# Patient Record
Sex: Female | Born: 1965 | Race: Black or African American | Hispanic: No | Marital: Single | Smoking: Former smoker
Health system: Southern US, Community
[De-identification: ages and names within clinical notes are randomized; demographics above are authoritative.]

## PROBLEM LIST (undated history)

## (undated) DIAGNOSIS — Z720 Tobacco use: Secondary | ICD-10-CM

## (undated) DIAGNOSIS — J449 Chronic obstructive pulmonary disease, unspecified: Secondary | ICD-10-CM

## (undated) DIAGNOSIS — E1142 Type 2 diabetes mellitus with diabetic polyneuropathy: Secondary | ICD-10-CM

## (undated) DIAGNOSIS — F329 Major depressive disorder, single episode, unspecified: Secondary | ICD-10-CM

## (undated) DIAGNOSIS — A630 Anogenital (venereal) warts: Secondary | ICD-10-CM

## (undated) DIAGNOSIS — I10 Essential (primary) hypertension: Secondary | ICD-10-CM

## (undated) DIAGNOSIS — C3490 Malignant neoplasm of unspecified part of unspecified bronchus or lung: Secondary | ICD-10-CM

## (undated) DIAGNOSIS — E119 Type 2 diabetes mellitus without complications: Secondary | ICD-10-CM

## (undated) DIAGNOSIS — M79609 Pain in unspecified limb: Secondary | ICD-10-CM

## (undated) DIAGNOSIS — Z8719 Personal history of other diseases of the digestive system: Secondary | ICD-10-CM

## (undated) DIAGNOSIS — E785 Hyperlipidemia, unspecified: Secondary | ICD-10-CM

## (undated) DIAGNOSIS — J189 Pneumonia, unspecified organism: Secondary | ICD-10-CM

## (undated) DIAGNOSIS — F319 Bipolar disorder, unspecified: Secondary | ICD-10-CM

## (undated) DIAGNOSIS — I25119 Atherosclerotic heart disease of native coronary artery with unspecified angina pectoris: Secondary | ICD-10-CM

## (undated) DIAGNOSIS — F32A Depression, unspecified: Secondary | ICD-10-CM

## (undated) DIAGNOSIS — K029 Dental caries, unspecified: Secondary | ICD-10-CM

## (undated) DIAGNOSIS — F4310 Post-traumatic stress disorder, unspecified: Secondary | ICD-10-CM

## (undated) DIAGNOSIS — M797 Fibromyalgia: Secondary | ICD-10-CM

## (undated) DIAGNOSIS — N611 Abscess of the breast and nipple: Secondary | ICD-10-CM

## (undated) DIAGNOSIS — D573 Sickle-cell trait: Secondary | ICD-10-CM

## (undated) DIAGNOSIS — K219 Gastro-esophageal reflux disease without esophagitis: Secondary | ICD-10-CM

## (undated) DIAGNOSIS — D649 Anemia, unspecified: Secondary | ICD-10-CM

## (undated) HISTORY — PX: BREAST SURGERY: SHX581

## (undated) HISTORY — PX: CARDIAC CATHETERIZATION: SHX172

## (undated) HISTORY — PX: LARYNX SURGERY: SHX692

## (undated) HISTORY — DX: Pain in unspecified limb: M79.609

## (undated) HISTORY — DX: Gastro-esophageal reflux disease without esophagitis: K21.9

## (undated) HISTORY — PX: EYE SURGERY: SHX253

## (undated) HISTORY — PX: REFRACTIVE SURGERY: SHX103

## (undated) HISTORY — PX: BREAST EXCISIONAL BIOPSY: SUR124

## (undated) HISTORY — DX: Malignant neoplasm of unspecified part of unspecified bronchus or lung: C34.90

## (undated) HISTORY — DX: Abscess of the breast and nipple: N61.1

## (undated) HISTORY — DX: Atherosclerotic heart disease of native coronary artery with unspecified angina pectoris: I25.119

## (undated) HISTORY — DX: Chronic obstructive pulmonary disease, unspecified: J44.9

## (undated) HISTORY — PX: INCISE AND DRAIN ABCESS: PRO64

## (undated) HISTORY — PX: OTHER SURGICAL HISTORY: SHX169

---
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
## 1986-01-11 HISTORY — PX: TONSILLECTOMY: SUR1361

## 2001-01-11 HISTORY — PX: BLADDER SURGERY: SHX569

## 2005-10-11 ENCOUNTER — Encounter (INDEPENDENT_AMBULATORY_CARE_PROVIDER_SITE_OTHER): Payer: Self-pay | Admitting: Internal Medicine

## 2005-10-11 LAB — CONVERTED CEMR LAB: Pap Smear: NORMAL

## 2006-02-22 ENCOUNTER — Encounter (INDEPENDENT_AMBULATORY_CARE_PROVIDER_SITE_OTHER): Payer: Self-pay | Admitting: Internal Medicine

## 2006-02-22 ENCOUNTER — Ambulatory Visit: Payer: Self-pay | Admitting: Internal Medicine

## 2006-02-22 LAB — CONVERTED CEMR LAB

## 2006-03-15 ENCOUNTER — Ambulatory Visit: Payer: Self-pay | Admitting: *Deleted

## 2006-03-22 ENCOUNTER — Ambulatory Visit: Payer: Self-pay | Admitting: Internal Medicine

## 2006-03-24 ENCOUNTER — Ambulatory Visit: Payer: Self-pay | Admitting: Internal Medicine

## 2006-04-04 ENCOUNTER — Encounter: Admission: RE | Admit: 2006-04-04 | Discharge: 2006-07-03 | Payer: Self-pay | Admitting: Internal Medicine

## 2006-05-31 ENCOUNTER — Ambulatory Visit: Payer: Self-pay | Admitting: Internal Medicine

## 2006-08-01 ENCOUNTER — Ambulatory Visit: Payer: Self-pay | Admitting: Internal Medicine

## 2006-09-16 ENCOUNTER — Encounter: Payer: Self-pay | Admitting: Internal Medicine

## 2006-09-29 ENCOUNTER — Encounter (INDEPENDENT_AMBULATORY_CARE_PROVIDER_SITE_OTHER): Payer: Self-pay | Admitting: Internal Medicine

## 2006-09-30 ENCOUNTER — Encounter (INDEPENDENT_AMBULATORY_CARE_PROVIDER_SITE_OTHER): Payer: Self-pay | Admitting: Internal Medicine

## 2006-09-30 DIAGNOSIS — F1910 Other psychoactive substance abuse, uncomplicated: Secondary | ICD-10-CM | POA: Insufficient documentation

## 2006-09-30 DIAGNOSIS — M722 Plantar fascial fibromatosis: Secondary | ICD-10-CM | POA: Insufficient documentation

## 2006-09-30 DIAGNOSIS — A6000 Herpesviral infection of urogenital system, unspecified: Secondary | ICD-10-CM | POA: Insufficient documentation

## 2006-09-30 DIAGNOSIS — K219 Gastro-esophageal reflux disease without esophagitis: Secondary | ICD-10-CM

## 2006-10-26 ENCOUNTER — Ambulatory Visit (HOSPITAL_COMMUNITY): Admission: RE | Admit: 2006-10-26 | Discharge: 2006-10-26 | Payer: Self-pay | Admitting: Obstetrics

## 2006-11-09 ENCOUNTER — Ambulatory Visit: Payer: Self-pay | Admitting: Internal Medicine

## 2006-11-24 ENCOUNTER — Ambulatory Visit: Payer: Self-pay | Admitting: Internal Medicine

## 2006-11-24 DIAGNOSIS — F3132 Bipolar disorder, current episode depressed, moderate: Secondary | ICD-10-CM | POA: Insufficient documentation

## 2006-11-24 LAB — CONVERTED CEMR LAB
Beta hcg, urine, semiquantitative: NEGATIVE
Nitrite: NEGATIVE
Specific Gravity, Urine: 1.02
Urobilinogen, UA: NEGATIVE
WBC Urine, dipstick: NEGATIVE
pH: 5

## 2006-11-27 ENCOUNTER — Encounter (INDEPENDENT_AMBULATORY_CARE_PROVIDER_SITE_OTHER): Payer: Self-pay | Admitting: Internal Medicine

## 2006-12-07 ENCOUNTER — Ambulatory Visit: Payer: Self-pay | Admitting: Internal Medicine

## 2006-12-25 ENCOUNTER — Encounter (INDEPENDENT_AMBULATORY_CARE_PROVIDER_SITE_OTHER): Payer: Self-pay | Admitting: Internal Medicine

## 2006-12-25 LAB — CONVERTED CEMR LAB
Cholesterol: 165 mg/dL (ref 0–200)
LDL Cholesterol: 90 mg/dL (ref 0–99)
Total CHOL/HDL Ratio: 2.9
VLDL: 18 mg/dL (ref 0–40)

## 2007-01-25 ENCOUNTER — Ambulatory Visit: Payer: Self-pay | Admitting: Nurse Practitioner

## 2007-01-25 LAB — CONVERTED CEMR LAB
Blood Glucose, Fingerstick: 98
Hgb A1c MFr Bld: 6.7 %

## 2007-03-22 ENCOUNTER — Telehealth (INDEPENDENT_AMBULATORY_CARE_PROVIDER_SITE_OTHER): Payer: Self-pay | Admitting: Internal Medicine

## 2007-04-06 ENCOUNTER — Ambulatory Visit: Payer: Self-pay | Admitting: Internal Medicine

## 2007-04-06 DIAGNOSIS — B360 Pityriasis versicolor: Secondary | ICD-10-CM

## 2007-04-06 DIAGNOSIS — R1906 Epigastric swelling, mass or lump: Secondary | ICD-10-CM | POA: Insufficient documentation

## 2007-04-06 LAB — CONVERTED CEMR LAB: Blood Glucose, Fingerstick: 112

## 2007-04-11 ENCOUNTER — Encounter (INDEPENDENT_AMBULATORY_CARE_PROVIDER_SITE_OTHER): Payer: Self-pay | Admitting: Internal Medicine

## 2007-04-11 LAB — CONVERTED CEMR LAB
HDL: 60 mg/dL (ref >39)
LDL Cholesterol: 76 mg/dL (ref 0–99)

## 2007-05-10 ENCOUNTER — Telehealth (INDEPENDENT_AMBULATORY_CARE_PROVIDER_SITE_OTHER): Payer: Self-pay | Admitting: Internal Medicine

## 2007-05-11 ENCOUNTER — Ambulatory Visit: Payer: Self-pay | Admitting: Internal Medicine

## 2007-05-11 LAB — CONVERTED CEMR LAB: Beta hcg, urine, semiquantitative: NEGATIVE

## 2007-05-15 ENCOUNTER — Emergency Department (HOSPITAL_COMMUNITY): Admission: EM | Admit: 2007-05-15 | Discharge: 2007-05-15 | Payer: Self-pay | Admitting: Emergency Medicine

## 2007-05-18 ENCOUNTER — Encounter (INDEPENDENT_AMBULATORY_CARE_PROVIDER_SITE_OTHER): Payer: Self-pay | Admitting: Internal Medicine

## 2007-05-24 ENCOUNTER — Encounter (INDEPENDENT_AMBULATORY_CARE_PROVIDER_SITE_OTHER): Payer: Self-pay | Admitting: Internal Medicine

## 2007-06-08 ENCOUNTER — Ambulatory Visit: Payer: Self-pay | Admitting: Internal Medicine

## 2007-07-25 ENCOUNTER — Ambulatory Visit: Payer: Self-pay | Admitting: Internal Medicine

## 2007-07-26 ENCOUNTER — Encounter (INDEPENDENT_AMBULATORY_CARE_PROVIDER_SITE_OTHER): Payer: Self-pay | Admitting: Internal Medicine

## 2007-08-29 ENCOUNTER — Telehealth (INDEPENDENT_AMBULATORY_CARE_PROVIDER_SITE_OTHER): Payer: Self-pay | Admitting: Internal Medicine

## 2007-09-04 LAB — CONVERTED CEMR LAB
Basophils Absolute: 0 10*3/uL (ref 0.0–0.1)
Hemoglobin: 13.2 g/dL (ref 12.0–15.0)
Lymphocytes Relative: 35 % (ref 12–46)
Lymphs Abs: 3.2 10*3/uL (ref 0.7–4.0)
Monocytes Absolute: 0.4 10*3/uL (ref 0.1–1.0)
Neutro Abs: 5.6 10*3/uL (ref 1.7–7.7)
Platelets: 346 10*3/uL (ref 150–400)
RDW: 13.4 % (ref 11.5–15.5)
TSH: 4.021 microintl units/mL (ref 0.350–4.50)
WBC: 9.4 10*3/uL (ref 4.0–10.5)

## 2007-10-25 ENCOUNTER — Telehealth (INDEPENDENT_AMBULATORY_CARE_PROVIDER_SITE_OTHER): Payer: Self-pay | Admitting: Internal Medicine

## 2007-10-25 ENCOUNTER — Ambulatory Visit: Payer: Self-pay | Admitting: Internal Medicine

## 2007-10-27 ENCOUNTER — Ambulatory Visit (HOSPITAL_COMMUNITY): Admission: RE | Admit: 2007-10-27 | Discharge: 2007-10-27 | Payer: Self-pay | Admitting: Internal Medicine

## 2007-11-15 ENCOUNTER — Ambulatory Visit: Payer: Self-pay | Admitting: Internal Medicine

## 2007-11-15 LAB — CONVERTED CEMR LAB
Alkaline Phosphatase: 70 units/L (ref 39–117)
BUN: 11 mg/dL (ref 6–23)
Blood Glucose, Fingerstick: 127
Chlamydia, Swab/Urine, PCR: NEGATIVE
Glucose, Bld: 88 mg/dL (ref 70–99)
Hgb A1c MFr Bld: 7 %
Total Bilirubin: 0.5 mg/dL (ref 0.3–1.2)

## 2007-11-23 ENCOUNTER — Encounter (INDEPENDENT_AMBULATORY_CARE_PROVIDER_SITE_OTHER): Payer: Self-pay | Admitting: Internal Medicine

## 2007-12-05 ENCOUNTER — Encounter (INDEPENDENT_AMBULATORY_CARE_PROVIDER_SITE_OTHER): Payer: Self-pay | Admitting: Family Medicine

## 2007-12-15 ENCOUNTER — Ambulatory Visit: Payer: Self-pay | Admitting: Internal Medicine

## 2007-12-21 ENCOUNTER — Encounter (INDEPENDENT_AMBULATORY_CARE_PROVIDER_SITE_OTHER): Payer: Self-pay | Admitting: Internal Medicine

## 2007-12-24 ENCOUNTER — Emergency Department (HOSPITAL_COMMUNITY): Admission: EM | Admit: 2007-12-24 | Discharge: 2007-12-24 | Payer: Self-pay | Admitting: Emergency Medicine

## 2008-02-02 ENCOUNTER — Encounter (INDEPENDENT_AMBULATORY_CARE_PROVIDER_SITE_OTHER): Payer: Self-pay | Admitting: Internal Medicine

## 2008-02-02 ENCOUNTER — Telehealth (INDEPENDENT_AMBULATORY_CARE_PROVIDER_SITE_OTHER): Payer: Self-pay | Admitting: Internal Medicine

## 2008-02-06 ENCOUNTER — Ambulatory Visit: Payer: Self-pay | Admitting: Internal Medicine

## 2008-02-09 ENCOUNTER — Encounter (INDEPENDENT_AMBULATORY_CARE_PROVIDER_SITE_OTHER): Payer: Self-pay | Admitting: Internal Medicine

## 2008-03-05 ENCOUNTER — Ambulatory Visit: Payer: Self-pay | Admitting: Internal Medicine

## 2008-03-12 ENCOUNTER — Ambulatory Visit: Payer: Self-pay | Admitting: Internal Medicine

## 2008-03-12 LAB — CONVERTED CEMR LAB: Hgb A1c MFr Bld: 6.9 %

## 2008-03-14 ENCOUNTER — Encounter (INDEPENDENT_AMBULATORY_CARE_PROVIDER_SITE_OTHER): Payer: Self-pay | Admitting: Family Medicine

## 2008-04-11 ENCOUNTER — Encounter (INDEPENDENT_AMBULATORY_CARE_PROVIDER_SITE_OTHER): Payer: Self-pay | Admitting: Nurse Practitioner

## 2008-04-16 ENCOUNTER — Encounter (INDEPENDENT_AMBULATORY_CARE_PROVIDER_SITE_OTHER): Payer: Self-pay | Admitting: Internal Medicine

## 2008-04-17 ENCOUNTER — Encounter (INDEPENDENT_AMBULATORY_CARE_PROVIDER_SITE_OTHER): Payer: Self-pay | Admitting: Nurse Practitioner

## 2008-05-03 ENCOUNTER — Ambulatory Visit: Payer: Self-pay | Admitting: Internal Medicine

## 2008-05-03 DIAGNOSIS — L738 Other specified follicular disorders: Secondary | ICD-10-CM | POA: Insufficient documentation

## 2008-05-03 LAB — CONVERTED CEMR LAB: Blood Glucose, AC Bkfst: 94 mg/dL

## 2008-05-05 LAB — CONVERTED CEMR LAB
Albumin: 4.2 g/dL (ref 3.5–5.2)
Alkaline Phosphatase: 60 units/L (ref 39–117)
Basophils Absolute: 0 10*3/uL (ref 0.0–0.1)
CO2: 21 meq/L (ref 19–32)
Glucose, Bld: 87 mg/dL (ref 70–99)
Hemoglobin: 12.8 g/dL (ref 12.0–15.0)
LDL Cholesterol: 94 mg/dL (ref 0–99)
Lymphocytes Relative: 36 % (ref 12–46)
Lymphs Abs: 3.7 10*3/uL (ref 0.7–4.0)
Monocytes Absolute: 0.6 10*3/uL (ref 0.1–1.0)
Monocytes Relative: 6 % (ref 3–12)
Neutro Abs: 5.9 10*3/uL (ref 1.7–7.7)
Potassium: 4.6 meq/L (ref 3.5–5.3)
RBC: 4.23 M/uL (ref 3.87–5.11)
Sodium: 141 meq/L (ref 135–145)
Total Protein: 7.2 g/dL (ref 6.0–8.3)
Triglycerides: 173 mg/dL — ABNORMAL HIGH (ref <150)
WBC: 10.3 10*3/uL (ref 4.0–10.5)

## 2008-05-29 ENCOUNTER — Telehealth (INDEPENDENT_AMBULATORY_CARE_PROVIDER_SITE_OTHER): Payer: Self-pay | Admitting: Internal Medicine

## 2008-05-30 ENCOUNTER — Encounter (INDEPENDENT_AMBULATORY_CARE_PROVIDER_SITE_OTHER): Payer: Self-pay | Admitting: Internal Medicine

## 2008-07-05 ENCOUNTER — Telehealth (INDEPENDENT_AMBULATORY_CARE_PROVIDER_SITE_OTHER): Payer: Self-pay | Admitting: Internal Medicine

## 2008-07-10 ENCOUNTER — Ambulatory Visit: Payer: Self-pay | Admitting: Internal Medicine

## 2008-07-11 ENCOUNTER — Ambulatory Visit (HOSPITAL_COMMUNITY): Admission: RE | Admit: 2008-07-11 | Discharge: 2008-07-11 | Payer: Self-pay | Admitting: Obstetrics

## 2008-07-11 ENCOUNTER — Encounter (INDEPENDENT_AMBULATORY_CARE_PROVIDER_SITE_OTHER): Payer: Self-pay | Admitting: Internal Medicine

## 2008-08-13 ENCOUNTER — Ambulatory Visit: Payer: Self-pay | Admitting: Internal Medicine

## 2008-08-13 DIAGNOSIS — N63 Unspecified lump in unspecified breast: Secondary | ICD-10-CM | POA: Insufficient documentation

## 2008-08-13 LAB — CONVERTED CEMR LAB: Blood Glucose, Fingerstick: 191

## 2008-08-16 ENCOUNTER — Encounter: Admission: RE | Admit: 2008-08-16 | Discharge: 2008-08-16 | Payer: Self-pay | Admitting: Internal Medicine

## 2008-09-03 ENCOUNTER — Encounter (INDEPENDENT_AMBULATORY_CARE_PROVIDER_SITE_OTHER): Payer: Self-pay | Admitting: Internal Medicine

## 2008-10-17 ENCOUNTER — Encounter (INDEPENDENT_AMBULATORY_CARE_PROVIDER_SITE_OTHER): Payer: Self-pay | Admitting: Internal Medicine

## 2008-11-14 ENCOUNTER — Encounter: Admission: RE | Admit: 2008-11-14 | Discharge: 2008-11-14 | Payer: Self-pay | Admitting: Obstetrics

## 2008-11-25 ENCOUNTER — Encounter: Admission: RE | Admit: 2008-11-25 | Discharge: 2008-11-25 | Payer: Self-pay | Admitting: Obstetrics

## 2008-12-31 ENCOUNTER — Ambulatory Visit: Payer: Self-pay | Admitting: Internal Medicine

## 2009-01-23 ENCOUNTER — Inpatient Hospital Stay (HOSPITAL_COMMUNITY): Admission: EM | Admit: 2009-01-23 | Discharge: 2009-01-26 | Payer: Self-pay | Admitting: Emergency Medicine

## 2009-01-26 HISTORY — PX: NM MYOVIEW LTD: HXRAD82

## 2009-02-04 ENCOUNTER — Observation Stay (HOSPITAL_COMMUNITY): Admission: EM | Admit: 2009-02-04 | Discharge: 2009-02-04 | Payer: Self-pay | Admitting: Emergency Medicine

## 2009-02-04 ENCOUNTER — Telehealth (INDEPENDENT_AMBULATORY_CARE_PROVIDER_SITE_OTHER): Payer: Self-pay | Admitting: Internal Medicine

## 2009-02-05 ENCOUNTER — Ambulatory Visit: Payer: Self-pay | Admitting: Vascular Surgery

## 2009-02-05 ENCOUNTER — Encounter (INDEPENDENT_AMBULATORY_CARE_PROVIDER_SITE_OTHER): Payer: Self-pay | Admitting: Emergency Medicine

## 2009-02-05 ENCOUNTER — Ambulatory Visit (HOSPITAL_COMMUNITY): Admission: RE | Admit: 2009-02-05 | Discharge: 2009-02-05 | Payer: Self-pay | Admitting: Emergency Medicine

## 2009-05-28 ENCOUNTER — Emergency Department (HOSPITAL_COMMUNITY): Admission: EM | Admit: 2009-05-28 | Discharge: 2009-05-28 | Payer: Self-pay | Admitting: Emergency Medicine

## 2009-07-19 ENCOUNTER — Encounter (INDEPENDENT_AMBULATORY_CARE_PROVIDER_SITE_OTHER): Payer: Self-pay | Admitting: Internal Medicine

## 2009-07-25 ENCOUNTER — Ambulatory Visit: Payer: Self-pay | Admitting: Internal Medicine

## 2009-07-25 LAB — CONVERTED CEMR LAB: Hgb A1c MFr Bld: 9.2 %

## 2009-08-07 ENCOUNTER — Ambulatory Visit: Payer: Self-pay | Admitting: Internal Medicine

## 2009-09-03 ENCOUNTER — Telehealth (INDEPENDENT_AMBULATORY_CARE_PROVIDER_SITE_OTHER): Payer: Self-pay | Admitting: Internal Medicine

## 2009-09-05 ENCOUNTER — Ambulatory Visit: Payer: Self-pay | Admitting: Internal Medicine

## 2009-09-05 LAB — CONVERTED CEMR LAB: Blood Glucose, Fingerstick: 142

## 2009-09-16 ENCOUNTER — Ambulatory Visit: Payer: Self-pay | Admitting: Internal Medicine

## 2009-09-24 ENCOUNTER — Encounter
Admission: RE | Admit: 2009-09-24 | Discharge: 2009-11-05 | Payer: Self-pay | Source: Home / Self Care | Admitting: Internal Medicine

## 2009-09-25 ENCOUNTER — Ambulatory Visit: Payer: Self-pay | Admitting: Internal Medicine

## 2009-10-30 ENCOUNTER — Ambulatory Visit: Payer: Self-pay | Admitting: Internal Medicine

## 2009-11-03 ENCOUNTER — Ambulatory Visit: Payer: Self-pay | Admitting: Internal Medicine

## 2009-11-04 ENCOUNTER — Encounter (INDEPENDENT_AMBULATORY_CARE_PROVIDER_SITE_OTHER): Payer: Self-pay | Admitting: Internal Medicine

## 2009-11-13 ENCOUNTER — Ambulatory Visit: Payer: Self-pay | Admitting: Internal Medicine

## 2009-11-17 ENCOUNTER — Encounter (INDEPENDENT_AMBULATORY_CARE_PROVIDER_SITE_OTHER): Payer: Self-pay | Admitting: Internal Medicine

## 2009-11-27 ENCOUNTER — Encounter: Admission: RE | Admit: 2009-11-27 | Discharge: 2009-11-27 | Payer: Self-pay | Admitting: Internal Medicine

## 2009-11-28 ENCOUNTER — Ambulatory Visit: Payer: Self-pay | Admitting: Internal Medicine

## 2009-12-02 ENCOUNTER — Encounter: Admission: RE | Admit: 2009-12-02 | Discharge: 2009-12-02 | Payer: Self-pay | Admitting: Internal Medicine

## 2009-12-02 ENCOUNTER — Encounter (INDEPENDENT_AMBULATORY_CARE_PROVIDER_SITE_OTHER): Payer: Self-pay | Admitting: Internal Medicine

## 2009-12-18 ENCOUNTER — Ambulatory Visit: Payer: Self-pay | Admitting: Internal Medicine

## 2009-12-18 DIAGNOSIS — G5600 Carpal tunnel syndrome, unspecified upper limb: Secondary | ICD-10-CM | POA: Insufficient documentation

## 2009-12-18 LAB — CONVERTED CEMR LAB: Blood Glucose, Fingerstick: 297

## 2010-02-01 ENCOUNTER — Encounter: Payer: Self-pay | Admitting: Internal Medicine

## 2010-02-10 NOTE — Letter (Signed)
Summary: DR.BREWINGTON/OPHTHALMOLOGIST/SPECIALIST  DR.BREWINGTON/OPHTHALMOLOGIST/SPECIALIST   Imported By: Arta Bruce 12/09/2009 15:55:12  _____________________________________________________________________  External Attachment:    Type:   Image     Comment:   External Document

## 2010-02-10 NOTE — Miscellaneous (Signed)
Summary: review of hospital records  Clinical Lists Changes  Problems: Added new problem of CHEST PAIN, NON-CARDIAC (ICD-786.59) - Hospitalized 01/23/2009:  CT angiogram of chest negative for PE. Nuclear Myoview/perfusion test of heart negatie for evidence of ischemiea.  EF 67%.  Ultrasound of abdomen normal.

## 2010-02-10 NOTE — Letter (Signed)
Summary: PSYCHOLOGY NOTES  PSYCHOLOGY NOTES   Imported By: Arta Bruce 08/25/2009 10:40:32  _____________________________________________________________________  External Attachment:    Type:   Image     Comment:   External Document

## 2010-02-10 NOTE — Progress Notes (Signed)
Summary: Possible D/C  Phone Note Outgoing Call   Summary of Call: Tiffany, Please contact pt and review no show policy. Pt is on 3rd no show any additional will result in discharge from practice. Thanks Initial call taken by: Hassell Halim CMA,  September 03, 2009 10:13 AM  Follow-up for Phone Call        pt reminded at her appt today. Follow-up by: Vesta Mixer CMA,  September 05, 2009 3:33 PM
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
## 2010-02-10 NOTE — Miscellaneous (Signed)
Summary: Rehab Report//INITIAL SUMMARY  Rehab Report//INITIAL SUMMARY   Imported By: Arta Bruce 11/17/2009 10:39:05  _____________________________________________________________________  External Attachment:    Type:   Image     Comment:   External Document

## 2010-02-10 NOTE — Miscellaneous (Signed)
Summary: Rehab Report//DISCHARGE SUMMARY  Rehab Report//DISCHARGE SUMMARY   Imported By: Arta Bruce 11/17/2009 16:47:37  _____________________________________________________________________  External Attachment:    Type:   Image     Comment:   External Document

## 2010-02-10 NOTE — Progress Notes (Signed)
Summary: COMPLAINING OF SWELLING  Phone Note Call from Patient Call back at [phone]   Summary of Call: Cassidy [name] PT. PATIENT CALLED AND IS COMPLAINING OF SWELLING IN BOTH HER WRIST, HANDS AND LIPS AND SAYS HER BALANCE IS A LITTLE OFF. SHE THINKS ITS COMING FROM THE NEW CHOLESTEROL MEDICATION THAT SHE WAS PUT ON WHEN SHE LEFT THE HOSPITAL. Initial call taken by: Leodis Rains,  February 04, 2009 9:48 AM  Follow-up for Phone Call        Spoke with pt she said she was having swelling in her ankles and legs and some in her lips and arms last night.  Today is better.  She has been taking the cholesterol med since 01/26/09 and does not think it is from the med and wonders if she should be on a fluid pill.  She has an appt with Dr. Delrae Alfred on next Tuesday.  Pt can be reached at [phone].  She is at school today and can check her msgs in between class. Follow-up by: Vesta Mixer CMA,  February 04, 2009 10:50 AM  Additional Follow-up for Phone Call Additional follow up Details #1::        Called pt--son answered and stated she had gone to the ED. Not sure if she will be admitted. He will let her know we called. Additional Follow-up by: Julieanne Manson MD,  February 04, 2009 6:35 PM

## 2010-02-10 NOTE — Assessment & Plan Note (Signed)
Summary: FU VISIT//GK   Vital Signs:  Patient profile:   45 year old female Height:      67 inches Weight:      250 pounds BMI:     39.30 Temp:     98.2 degrees F oral Pulse rate:   88 / minute Pulse rhythm:   regular Resp:     18 per minute BP sitting:   134 / 88  (left arm) Cuff size:   large  Vitals Entered By: [place] [name] (July 25, 2009 3:55 PM) CC: pt here for f/u...Marland KitchenMarland Kitchen pt says she has been stop taking byetta because they put her on warfin while in hospital and the shots made her bruise so she stop taking the byetta shots... Is Patient Diabetic? Yes Pain Assessment Patient in pain? no      CBG Result 148  Does patient need assistance? Functional Status Self care Ambulation Normal   CC:  pt here for f/u...Marland KitchenMarland Kitchen pt says she has been stop taking byetta because they put her on warfin while in hospital and the shots made her bruise so she stop taking the byetta shots....  History of Present Illness: 1.  Chest pain:  pt admitted and underwent nuclear stress testing that was negative for ischemia in January--Good EF at 67%.  Subsequently had swelling of legs--seen in ED and ruled out for DVT.  Following with [facility]--Dr. Lynnea Ferrier.  No chest pain since admission  2.  DM:  Not checking sugars.  Got burned out on taking medication and just not taking.  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  Would be willing to see Namon Cirri getting any counseling now.  No longer on Coumadin  3. Stressed and tearful a lot:  Not suicidal, but some days, just does not want to be here.  Not taking Paxil and Depakote--[facility]--does [facility].  Has not taken for 3 months.  Has appt. next Thursday with [facility].  4.  Sore throat, lot of drainage down throat, coughing--seems to come and go..  Getting pink eye.  Was given unknown eye drops for pink eye in [place] last week--helped.  Had a fever with cold and vomiting diarrhea last  week.   Allergies (verified): No Known Drug Allergies  Physical Exam  General:  NAD Eyes:  Mild conjunctival injection bilaterally Nose:  clear nasal discharge and mucosal pallor.   Mouth:  Cobbled posterior pharynx Lungs:  Normal respiratory effort, chest expands symmetrically. Lungs are clear to auscultation, no crackles or wheezes. Heart:  Normal rate and regular rhythm. S1 and S2 normal without gallop, murmur, click, rub or other extra sounds.  Radial pulses normal and equal. Extremities:  no significant edema   Impression & Recommendations:  Problem # 1:  CHEST PAIN, NON-CARDIAC (ICD-786.59) As per Dr. Lynnea Ferrier.  Problem # 2:  BIPOLAR I D/O MOST RECENT EPIS DEPRESSED MOD (ICD-296.52) Encouraged pt. to get back on her meds. If Bipolar Disorder/depression not controlled, will have difficulty controlling other health concerns. Orders: Psychology Referral (Psychology)--Amanda Vaughn  Problem # 3:  DM (ICD-250.00) Encouraged to get back on meds. Her updated medication list for this problem includes:    Lisinopril 5 Mg Tabs (Lisinopril) .Marland Kitchen... 1/2 tab by mouth daily    Metformin Hcl 500 Mg Tabs (Metformin hcl) .Marland Kitchen... 1 tab by mouth two times a day    Byetta 5 Mcg Pen 5 Mcg/0.70ml Soln (Exenatide) .Marland KitchenMarland KitchenMarland KitchenMarland Kitchen 5 micrograms subcutaneously two times a day before meals  Problem #  4:  ALLERGIC RHINITIS WITH CONJUNCTIVITIS (ICD-477.9) Start meds. Her updated medication list for this problem includes:    Fluticasone Propionate 50 Mcg/act Susp (Fluticasone propionate) .Marland Kitchen... 2 sprays each nostril daily    Cetirizine Hcl 10 Mg Tabs (Cetirizine hcl) .Marland Kitchen... 1 tab by mouth daily  Complete Medication List: 1)  Lisinopril 5 Mg Tabs (Lisinopril) .... 1/2 tab by mouth daily 2)  Gabapentin 300 Mg Caps (Gabapentin) .Marland Kitchen.. 1 cap by mouth two times a day 3)  Paxil Cr 25 Mg Tb24 (Paroxetine hcl) .Marland Kitchen.. 1 tab by mouth daily 4)  Metformin Hcl 500 Mg Tabs (Metformin hcl) .Marland Kitchen.. 1 tab by mouth two times a day 5)   Enablex 15 Mg Tb24 (Darifenacin hydrobromide) .Marland Kitchen.. 1 tab by mouth daily 6)  Elmiron 100 Mg Caps (Pentosan polysulfate sodium) .... 2 caps in am and 1 cap at hs. 7)  Naproxen Sodium 550 Mg Tabs (Naproxen sodium) .Marland Kitchen.. 1 tab by mouth two times a day as needed headache 8)  Proventil 90 Mcg/act Aers (Albuterol) .... 2 puffs every 6 hours as needed for shortness of breath 9)  Omeprazole 20 Mg Tbec (Omeprazole) .... 2 tabs by mouth daily 10)  Depakote Er 500 Mg Xr24h-tab (Divalproex sodium) .Marland Kitchen.. 1 tab by mouth at bedtime--[facility] 11)  Acyclovir 200 Mg Caps (Acyclovir) .... 2 by mouth once daily 12)  Prenatal Plus 27-1 Mg Tabs (Prenatal vit-fe fumarate-fa) .... Take one tab by mouth every day--gynecology 13)  Byetta 5 Mcg Pen 5 Mcg/0.68ml Soln (Exenatide) .... 5 micrograms subcutaneously two times a day before meals 14)  Pen Needles 31g X 6 Mm Misc (Insulin pen needle) .... To use with byetta pen  two times a day 15)  Fluticasone Propionate 50 Mcg/act Susp (Fluticasone propionate) .... 2 sprays each nostril daily 16)  Cetirizine Hcl 10 Mg Tabs (Cetirizine hcl) .Marland Kitchen.. 1 tab by mouth daily  Patient Instructions: 1)  Follow up with Dr. Delrae Alfred in 6 weeks  Prescriptions: NAPROXEN SODIUM 550 MG  TABS (NAPROXEN SODIUM) 1 tab by mouth two times a day as needed headache  #30 x 0   Entered and Authorized by:   Julieanne Manson MD   Signed by:   Julieanne Manson MD on 07/25/2009   Method used:   Electronically to        [facility] [phone]* (retail)       [address]       [place], [place]  [address]       Ph: [phone]       Fax: [phone]   RxID:   [id number] CETIRIZINE HCL 10 MG TABS (CETIRIZINE HCL) 1 tab by mouth daily  #30 x 11   Entered and Authorized by:   Julieanne Manson MD   Signed by:   Julieanne Manson MD on 07/25/2009   Method used:   Electronically to        [facility] [phone]* (retail)       [address]       [place], [place]  [address]        Ph: [phone]       Fax: [phone]   RxID:   [id number] FLUTICASONE PROPIONATE 50 MCG/ACT SUSP (FLUTICASONE PROPIONATE) 2 sprays each nostril daily  #1 x 11   Entered and Authorized by:   Julieanne Manson MD   Signed by:   Julieanne Manson MD on 07/25/2009   Method used:   Electronically to        [facility] [address] [phone]* (retail)       [address]       [place], [place]  [address]       Ph: [phone]       Fax: [phone]   RxID:   [id number] GABAPENTIN 300 MG CAPS (GABAPENTIN) 1 cap by mouth two times a day  #60 x 11   Entered and Authorized by:   Julieanne Manson MD   Signed by:   Julieanne Manson MD on 07/25/2009   Method used:   Print then Give to Patient   RxID:   [id number] PEN NEEDLES 31G X 6 MM MISC (INSULIN PEN NEEDLE) To use with Byetta pen  two times a day  #100 x 11   Entered and Authorized by:   Julieanne Manson MD   Signed by:   Julieanne Manson MD on 07/25/2009   Method used:   Electronically to        [facility] [phone]* (retail)       [address]       [place], [place]  [address]       Ph: [phone]       Fax: [phone]   RxID:   [id number] BYETTA 5 MCG PEN 5 MCG/0.02ML SOLN (EXENATIDE) 5 micrograms subcutaneously two times a day before meals  #1 Milliliter x 11   Entered and Authorized by:   Julieanne Manson MD   Signed by:   Julieanne Manson MD on 07/25/2009   Method used:   Electronically to        [facility] [phone]* (retail)       [address]       [place], [place]  [address]       Ph: [phone]       Fax: [phone]   RxID:   [id number] ACYCLOVIR 200 MG CAPS (ACYCLOVIR) 2 by mouth once daily  #60 x 11   Entered and Authorized by:   Julieanne Manson MD   Signed by:   Julieanne Manson MD on 07/25/2009   Method used:   Electronically to        [facility] [phone]* (retail)       [address]       [place], [place]  [address]       Ph: [phone]       Fax:  [phone]   RxID:   [id number] OMEPRAZOLE 20 MG  TBEC (OMEPRAZOLE) 2 tabs by mouth daily  #60 x 11   Entered and Authorized by:   Julieanne Manson MD   Signed by:   Julieanne Manson MD on 07/25/2009   Method used:   Electronically to        [facility] [phone]* (retail)       [address]       [place], [place]  [address]       Ph: [phone]       Fax: [phone]   RxID:   [id number] METFORMIN HCL 500 MG  TABS (METFORMIN HCL) 1 tab by mouth two times a day  #60 Each x 10   Entered and Authorized by:   Julieanne Manson MD   Signed by:   Julieanne Manson MD on 07/25/2009   Method used:   Electronically to        [facility] [phone]* (retail)       [address]       [place], [place]  [address]  Ph: [phone]       Fax: [phone]   RxID:   [id number] LISINOPRIL 5 MG  TABS (LISINOPRIL) 1/2 tab by mouth daily  #15 Each x 11   Entered and Authorized by:   Julieanne Manson MD   Signed by:   Julieanne Manson MD on 07/25/2009   Method used:   Electronically to        [facility] [phone]* (retail)       [address]       [place], [place]  [address]       Ph: [phone]       Fax: [phone]   RxID:   [id number]   Laboratory Results   Blood Tests     HGBA1C: 9.2%   (Normal Range: Non-Diabetic - 3-6%   Control Diabetic - 6-8%) CBG Random:: 148mg /dL

## 2010-02-12 NOTE — Assessment & Plan Note (Signed)
Summary: neck pain. 3 month fup/JM   Vital Signs:  Patient profile:   45 year old female Menstrual status:  irregular LMP:     12/06/2009 Weight:      254.56 pounds Temp:     97.1 degrees F oral Pulse rate:   88 / minute Pulse rhythm:   regular Resp:     24 per minute BP sitting:   144 / 90  (left arm) Cuff size:   regular  Vitals Entered By: Hale Drone CMA (December 18, 2009 10:41 AM) CC: 3 month f/u on neck pain and DM. Wants to speak to you about anxiety meds.  Is Patient Diabetic? Yes Pain Assessment Patient in pain? no      CBG Result 297 CBG Device ID Non Fasting  Does patient need assistance? Functional Status Self care Ambulation Normal LMP (date): 12/06/2009 LMP - Character: heavy     Menstrual Status irregular Enter LMP: 12/06/2009 Last PAP Result Normal   CC:  3 month f/u on neck pain and DM. Wants to speak to you about anxiety meds. .  History of Present Illness: 1.  Neck pain and headaches:  PT helped --pain stopped and headaches as well.  2.  Lots of stressors:  not able to focus on school and so not doing well.  Son just turned 13 and getting into lots of trouble--going through juvenile court.  Son going into foster care for at least a month and mom planning to move to a better area to so that he is surrounded by kids who are better influences.  Son now cannot be around children younger than him.  3.  Numbness in index and middle finger.  Awakened with this this morning.  Current Medications (verified): 1)  Lisinopril 5 Mg  Tabs (Lisinopril) .... 1/2 Tab By Mouth Daily 2)  Gabapentin 300 Mg Caps (Gabapentin) .Marland Kitchen.. 1 Cap By Mouth Two Times A Day 3)  Paxil Cr 25 Mg  Tb24 (Paroxetine Hcl) .Marland Kitchen.. 1 Tab By Mouth Daily 4)  Metformin Hcl 500 Mg  Tabs (Metformin Hcl) .Marland Kitchen.. 1 Tab By Mouth Two Times A Day 5)  Enablex 15 Mg  Tb24 (Darifenacin Hydrobromide) .Marland Kitchen.. 1 Tab By Mouth Daily 6)  Elmiron 100 Mg  Caps (Pentosan Polysulfate Sodium) .... 2 Caps in Am and 1 Cap  At [facility]. 7)  Naproxen Sodium 550 Mg  Tabs (Naproxen Sodium) .Marland Kitchen.. 1 Tab By Mouth Two Times A Day As Needed Headache 8)  Proventil 90 Mcg/act  Aers (Albuterol) .... 2 Puffs Every 6 Hours As Needed For Shortness of Breath 9)  Omeprazole 20 Mg  Tbec (Omeprazole) .... 2 Tabs By Mouth Daily 10)  Depakote Er 500 Mg Xr24h-Tab (Divalproex Sodium) .Marland Kitchen.. 1 Tab By Mouth At [facility] 11)  Acyclovir 200 Mg Caps (Acyclovir) .... 2 By Mouth Once Daily 12)  Prenatal Plus 27-1 Mg Tabs (Prenatal Vit-Fe Fumarate-Fa) .... Take One Tab By Mouth Every Day--Gynecology 13)  Byetta 5 Mcg Pen 5 Mcg/0.75ml Soln (Exenatide) .... 5 Micrograms Subcutaneously Two Times A Day Before Meals 14)  Pen Needles 31g X 6 Mm Misc (Insulin Pen Needle) .... To Use With Byetta Pen  Two Times A Day 15)  Fluticasone Propionate 50 Mcg/act Susp (Fluticasone Propionate) .... 2 Sprays Each Nostril Daily 16)  Cetirizine Hcl 10 Mg Tabs (Cetirizine Hcl) .Marland Kitchen.. 1 Tab By Mouth Daily 17)  Wellbutrin Xl 150 Mg Xr24h-Tab (Bupropion Hcl) .Marland Kitchen.. 1 Tab By Mouth Daily--Pt. Not Sure of Dose--Through Dr. Mila Homer  Allergies (verified): No Known Drug Allergies  Physical Exam  General:  Stressed appearing Neurologic:  Positive Tinel's over left median nerve   Impression & Recommendations:  Problem # 1:  DEPRESSION (ICD-311) Encouraged pt. to make an appt. this week with [facility] Will discuss with Aquilla Solian, her counselor, who she is seeing today Her updated medication list for this problem includes:    Paxil Cr 25 Mg Tb24 (Paroxetine hcl) .Marland Kitchen... 1 tab by mouth daily--[facility]    Wellbutrin Xl 150 Mg Xr24h-tab (Bupropion hcl) .Marland Kitchen... 1 tab by mouth daily--pt. not sure of dose--through dr. Mila Homer  Problem # 2:  NECK PAIN (ICD-723.1) Resolved er updated medication list for this problem includes:    Naproxen Sodium 550 Mg Tabs (Naproxen sodium) .Marland Kitchen... 1 tab by mouth two times a day as needed headache  Problem # 3:  CARPAL TUNNEL  SYNDROME, LEFT (ICD-354.0) Cock up splint--to wear nightly  Complete Medication List: 1)  Lisinopril 5 Mg Tabs (Lisinopril) .... 1/2 tab by mouth daily 2)  Gabapentin 300 Mg Caps (Gabapentin) .Marland Kitchen.. 1 cap by mouth two times a day 3)  Paxil Cr 25 Mg Tb24 (Paroxetine hcl) .Marland Kitchen.. 1 tab by mouth daily--[facility] 4)  Metformin Hcl 500 Mg Tabs (Metformin hcl) .Marland Kitchen.. 1 tab by mouth two times a day 5)  Enablex 15 Mg Tb24 (Darifenacin hydrobromide) .Marland Kitchen.. 1 tab by mouth daily 6)  Elmiron 100 Mg Caps (Pentosan polysulfate sodium) .... 2 caps in am and 1 cap at hs. 7)  Naproxen Sodium 550 Mg Tabs (Naproxen sodium) .Marland Kitchen.. 1 tab by mouth two times a day as needed headache 8)  Proventil 90 Mcg/act Aers (Albuterol) .... 2 puffs every 6 hours as needed for shortness of breath 9)  Omeprazole 20 Mg Tbec (Omeprazole) .... 2 tabs by mouth daily 10)  Depakote Er 500 Mg Xr24h-tab (Divalproex sodium) .Marland Kitchen.. 1 tab by mouth at bedtime--[facility] 11)  Acyclovir 200 Mg Caps (Acyclovir) .... 2 by mouth once daily 12)  Prenatal Plus 27-1 Mg Tabs (Prenatal vit-fe fumarate-fa) .... Take one tab by mouth every day--gynecology 13)  Byetta 5 Mcg Pen 5 Mcg/0.73ml Soln (Exenatide) .... 5 micrograms subcutaneously two times a day before meals 14)  Pen Needles 31g X 6 Mm Misc (Insulin pen needle) .... To use with byetta pen  two times a day 15)  Fluticasone Propionate 50 Mcg/act Susp (Fluticasone propionate) .... 2 sprays each nostril daily 16)  Cetirizine Hcl 10 Mg Tabs (Cetirizine hcl) .Marland Kitchen.. 1 tab by mouth daily 17)  Wellbutrin Xl 150 Mg Xr24h-tab (Bupropion hcl) .Marland Kitchen.. 1 tab by mouth daily--pt. not sure of dose--through dr. Mila Homer  Other Orders: Capillary Blood Glucose/CBG [phone])  Patient Instructions: 1)  Wear cock up splint to bed each night 2)  Follow up with Dr. Delrae Alfred in 4 months --htn, DM   Orders Added: 1)  Capillary Blood Glucose/CBG [82948] 2)  Est. Patient Level III [81191]      Appended Document: neck  pain. 3 month fup/JM Spoke with Aquilla Solian later in morning--pt's son actually going to a specialized group home geared to reunite him with his mother as soon as possible.

## 2010-02-13 ENCOUNTER — Telehealth (INDEPENDENT_AMBULATORY_CARE_PROVIDER_SITE_OTHER): Payer: Self-pay | Admitting: *Deleted

## 2010-02-21 ENCOUNTER — Encounter (INDEPENDENT_AMBULATORY_CARE_PROVIDER_SITE_OTHER): Payer: Self-pay | Admitting: Internal Medicine

## 2010-02-26 NOTE — Letter (Signed)
Summary: Generic Letter  [facility]  [address] [place], [place] [address]   Phone: [phone]  Fax: [phone]    02/21/2010  [facility]  Re:  Cassidy [name]     [address] [place], [place]  [address]  To Whom It May Concen:  Cassidy [name] is my patient at [facility].  She has been dealing with significant physical and mental health issues, particularly dating back to July  through the late fall of 2011.  I ask that this is taken into account as she registers for school this year.     Sincerely,   Julieanne Manson MD

## 2010-03-04 NOTE — Progress Notes (Signed)
Summary: [facility] NEEDS A LETTER  Phone Note Call from Patient Call back at Home Phone [phone]   Summary of Call: [facility]ERR[facility] PT.  MS Kostelnik STOPPED BY BECAUSE SHE NEEDS A STATEMENT FOR [facility], STATING WHAT YOU WERE TREATING HER FOR, AS FAR AS HER DEPRESSION AND GOING THRU THINGS WITH HER SON. IT NEEDS TO STATE THAT SHE WAS BEING TREATED LAST SRPING AND FALL OF 2011 AND FALL OF 2012. THE REASON FOR THIS LETTER IS [facility] PUT HER ON PROBATION BECAUSE OF THE DEPRESSION AND GOING TO COURT, REGISTRATION IF MONDAY THE 6th AND THEY NEED THE LETTER BEFORE 5PM AND SHE ISA ALSO GETT A LETTER FROM [facility] ON MONDAY AS WELL. Initial call taken by: Leodis Rains,  February 13, 2010 3:02 PM  Follow-up for Phone Call        MS Nugent CALLED TO SEE IF SHE CAN GET THIS LETTER, BECAUSE SHE IS TRYING TO GETY REGISTERED TODAY AT [facility] AND ITS A ONE DAY REGISTRATION AND SHE'S SORRY THAT IT WAS SUCH SHORT NOTICE, BUT THE SCHOOL DIDN'T GIVE HER MUCH TIME EITHER. Follow-up by: Leodis Rains,  February 16, 2010 11:06 AM  Additional Follow-up for Phone Call Additional follow up Details #1::        I was out earlier in week and did not get to this.  Not sure if it would still be helpful for her, but she can pick up or have [place] fax to [facility].   Will put on Sheila's desk. Additional Follow-up by: Julieanne Manson MD,  February 21, 2010 1:58 PM    Additional Follow-up for Phone Call Additional follow up Details #2::    CALLED LEFT MESSAGE FOR PT TO PICK UP LETTER//IN DRAWER FOR PT PICK UP Follow-up by: Arta Bruce,  February 23, 2010 10:05 AM

## 2010-03-29 LAB — CBC
Hemoglobin: 12.9 g/dL (ref 12.0–15.0)
MCHC: 34.1 g/dL (ref 30.0–36.0)
Platelets: 298 10*3/uL (ref 150–400)
RDW: 13 % (ref 11.5–15.5)

## 2010-03-29 LAB — CARDIAC PANEL(CRET KIN+CKTOT+MB+TROPI)
CK, MB: 0.6 ng/mL (ref 0.3–4.0)
Relative Index: INVALID (ref 0.0–2.5)
Total CK: 67 U/L (ref 7–177)
Total CK: 69 U/L (ref 7–177)
Troponin I: 0.06 ng/mL (ref 0.00–0.06)

## 2010-03-29 LAB — DIFFERENTIAL
Basophils Absolute: 0.1 10*3/uL (ref 0.0–0.1)
Basophils Relative: 1 % (ref 0–1)
Monocytes Absolute: 0.6 10*3/uL (ref 0.1–1.0)
Neutro Abs: 6 10*3/uL (ref 1.7–7.7)
Neutrophils Relative %: 58 % (ref 43–77)

## 2010-03-29 LAB — HEPATIC FUNCTION PANEL
ALT: 12 U/L (ref 0–35)
AST: 12 U/L (ref 0–37)
Bilirubin, Direct: <0.1 mg/dL (ref 0.0–0.3)
Total Bilirubin: 0.6 mg/dL (ref 0.3–1.2)

## 2010-03-29 LAB — GLUCOSE, CAPILLARY
Glucose-Capillary: 109 mg/dL — ABNORMAL HIGH (ref 70–99)
Glucose-Capillary: 154 mg/dL — ABNORMAL HIGH (ref 70–99)
Glucose-Capillary: 166 mg/dL — ABNORMAL HIGH (ref 70–99)

## 2010-03-29 LAB — POCT CARDIAC MARKERS
CKMB, poc: <1 ng/mL — ABNORMAL LOW (ref 1.0–8.0)
Myoglobin, poc: 57.2 ng/mL (ref 12–200)

## 2010-03-29 LAB — TROPONIN I: Troponin I: 0.04 ng/mL (ref 0.00–0.06)

## 2010-03-29 LAB — CK TOTAL AND CKMB (NOT AT ARMC)
CK, MB: 0.8 ng/mL (ref 0.3–4.0)
Total CK: 75 U/L (ref 7–177)

## 2010-03-29 LAB — POCT I-STAT, CHEM 8
Glucose, Bld: 96 mg/dL (ref 70–99)
HCT: 38 % (ref 36.0–46.0)
Hemoglobin: 12.9 g/dL (ref 12.0–15.0)
Potassium: 4.1 mEq/L (ref 3.5–5.1)

## 2010-03-30 LAB — HEPATIC FUNCTION PANEL
AST: 20 U/L (ref 0–37)
Albumin: 3.8 g/dL (ref 3.5–5.2)
Total Bilirubin: 0.2 mg/dL — ABNORMAL LOW (ref 0.3–1.2)
Total Protein: 6.8 g/dL (ref 6.0–8.3)

## 2010-03-30 LAB — POCT I-STAT, CHEM 8
BUN: 8 mg/dL (ref 6–23)
Creatinine, Ser: 0.7 mg/dL (ref 0.4–1.2)
Hemoglobin: 12.2 g/dL (ref 12.0–15.0)
Potassium: 3.6 mEq/L (ref 3.5–5.1)
Sodium: 139 mEq/L (ref 135–145)

## 2010-03-30 LAB — GLUCOSE, CAPILLARY: Glucose-Capillary: 176 mg/dL — ABNORMAL HIGH (ref 70–99)

## 2010-11-09 ENCOUNTER — Other Ambulatory Visit: Payer: Self-pay | Admitting: Internal Medicine

## 2010-11-09 DIAGNOSIS — Z1231 Encounter for screening mammogram for malignant neoplasm of breast: Secondary | ICD-10-CM

## 2010-12-07 ENCOUNTER — Emergency Department (HOSPITAL_COMMUNITY)
Admission: EM | Admit: 2010-12-07 | Discharge: 2010-12-07 | Disposition: A | Discharge status: Home / Self Care | Payer: Medicaid Other | Attending: Emergency Medicine | Admitting: Emergency Medicine

## 2010-12-07 ENCOUNTER — Ambulatory Visit: Payer: Self-pay

## 2010-12-07 ENCOUNTER — Encounter: Payer: Self-pay | Admitting: *Deleted

## 2010-12-07 ENCOUNTER — Other Ambulatory Visit: Payer: Self-pay | Admitting: Emergency Medicine

## 2010-12-07 DIAGNOSIS — N644 Mastodynia: Secondary | ICD-10-CM | POA: Insufficient documentation

## 2010-12-07 DIAGNOSIS — N61 Mastitis without abscess: Secondary | ICD-10-CM | POA: Insufficient documentation

## 2010-12-07 DIAGNOSIS — F3289 Other specified depressive episodes: Secondary | ICD-10-CM | POA: Insufficient documentation

## 2010-12-07 DIAGNOSIS — E119 Type 2 diabetes mellitus without complications: Secondary | ICD-10-CM | POA: Insufficient documentation

## 2010-12-07 DIAGNOSIS — F17200 Nicotine dependence, unspecified, uncomplicated: Secondary | ICD-10-CM | POA: Insufficient documentation

## 2010-12-07 DIAGNOSIS — F329 Major depressive disorder, single episode, unspecified: Secondary | ICD-10-CM | POA: Insufficient documentation

## 2010-12-07 DIAGNOSIS — N611 Abscess of the breast and nipple: Secondary | ICD-10-CM

## 2010-12-07 DIAGNOSIS — Z79899 Other long term (current) drug therapy: Secondary | ICD-10-CM | POA: Insufficient documentation

## 2010-12-07 DIAGNOSIS — I10 Essential (primary) hypertension: Secondary | ICD-10-CM | POA: Insufficient documentation

## 2010-12-07 HISTORY — DX: Essential (primary) hypertension: I10

## 2010-12-07 HISTORY — DX: Depression, unspecified: F32.A

## 2010-12-07 HISTORY — DX: Major depressive disorder, single episode, unspecified: F32.9

## 2010-12-07 MED ORDER — OXYCODONE-ACETAMINOPHEN 5-325 MG PO TABS
2.0000 | ORAL_TABLET | Freq: Once | ORAL | Status: AC
  Administered 2010-12-07: 2 via ORAL
  Filled 2010-12-07 (×2): qty 1

## 2010-12-07 MED ORDER — OXYCODONE-ACETAMINOPHEN 5-325 MG PO TABS: 2.0000 | ORAL_TABLET | ORAL | Status: DC | PRN

## 2010-12-07 NOTE — ED Provider Notes (Signed)
History     CSN: [id number] Arrival date & time: 12/07/2010  8:18 AM   First MD Initiated Contact with Patient 12/07/10 [phone]      Chief Complaint  Patient presents with  . Wound Infection    (Consider location/radiation/quality/duration/timing/severity/associated sxs/prior treatment) Patient is a 44 y.o. female presenting with abscess. The history is provided by the patient. No language interpreter was used.  Abscess  This is a new problem. The current episode started more than one week ago. The onset was gradual. The problem occurs continuously. The problem is moderate. The abscess is characterized by painfulness and swelling. The abscess first occurred at home. Pertinent negatives include no anorexia, not sleeping less, not drinking less, no fever and no vomiting.   Today with complaint of pain in her right breast. She has a known abscess in this area and she is on doxycycline x14 days with no improvement. [facility]. States that she had a mammogram scheduled for this morning but she canceled because she was in so much pain. Will treat the pain and sent her to the breast center for her ultrasound/mammogram this morning. States that she can get her route. Past Medical History  Diagnosis Date  . Hypertension   . Diabetes mellitus   . Depression     History reviewed. No pertinent past surgical history.  History reviewed. No pertinent family history.  History  Substance Use Topics  . Smoking status: Current Everyday Smoker -- 0.5 packs/day    Types: Cigarettes  . Smokeless tobacco: Not on file  . Alcohol Use: No    OB History    Grav Para Term Preterm Abortions TAB SAB Ect Mult Living                  Review of Systems  Constitutional: Negative for fever.  Gastrointestinal: Negative for vomiting and anorexia.  All other systems reviewed and are negative.    Allergies  Review of patient's allergies indicates no known allergies.  Home  Medications   Current Outpatient Rx  Name Route Sig Dispense Refill  . BUPROPION HCL ER (SR) 100 MG PO TB12 Oral Take 100 mg by mouth daily.      Marland Kitchen DIVALPROEX SODIUM 500 MG PO TB24 Oral Take 1,000 mg by mouth at bedtime.      Marland Kitchen DOXYCYCLINE HYCLATE 100 MG PO CAPS Oral Take 200 mg by mouth 2 (two) times daily.      Marland Kitchen LISINOPRIL 20 MG PO TABS Oral Take 20 mg by mouth daily.      Marland Kitchen METFORMIN HCL 500 MG PO TABS Oral Take 500 mg by mouth 2 (two) times daily with a meal.      . PAROXETINE HCL 30 MG PO TABS Oral Take 30 mg by mouth every morning.        BP 136/81  Pulse 91  Temp(Src) 97.2 F (36.2 C) (Oral)  Resp 20  SpO2 97%  LMP 11/28/2010  Physical Exam  Constitutional: She is oriented to person, place, and time. She appears well-developed and well-nourished.  Eyes: Pupils are equal, round, and reactive to light.  Neck: Normal range of motion.  Pulmonary/Chest: Effort normal.  Abdominal: Soft.  Musculoskeletal: Normal range of motion.  Neurological: She is alert and oriented to person, place, and time.  Skin: Skin is warm and dry.       4cm mass to R breast Painful   Psychiatric:       Patient is  crying    ED Course  Procedures (including critical care time)  Labs Reviewed - No data to display No results found.   No diagnosis found.    MDM  Known deep  abscess to the right breast per [facility] PCP. Doxycycline x2 weeks with some improvement. Scheduled  mammogram this morning was skipped  because she was in so much pain. Percocet 2 in the ER. Will refer to surgeon and sent her to the breast center with pain medicine for her mammogram/ultra sound.  Unable to U/s breasts at this facility.         Jethro Bastos, NP 12/08/10 [phone]

## 2010-12-07 NOTE — ED Notes (Signed)
Reports being treated with doxy x 2 weeks for abscess to right nipple, pain is now increased and spreading into breast and under arm.

## 2010-12-08 ENCOUNTER — Ambulatory Visit (INDEPENDENT_AMBULATORY_CARE_PROVIDER_SITE_OTHER): Payer: Medicaid Other | Admitting: Surgery

## 2010-12-08 ENCOUNTER — Ambulatory Visit
Admission: RE | Admit: 2010-12-08 | Discharge: 2010-12-08 | Disposition: A | Discharge status: Home / Self Care | Payer: Medicaid Other | Source: Ambulatory Visit | Attending: Internal Medicine | Admitting: Internal Medicine

## 2010-12-08 ENCOUNTER — Other Ambulatory Visit: Payer: Self-pay | Admitting: Internal Medicine

## 2010-12-08 ENCOUNTER — Encounter (INDEPENDENT_AMBULATORY_CARE_PROVIDER_SITE_OTHER): Payer: Self-pay | Admitting: Surgery

## 2010-12-08 VITALS — BP 126/80 | HR 80 | Temp 97.4°F | Resp 20 | Ht 68.0 in | Wt 249.2 lb

## 2010-12-08 DIAGNOSIS — N63 Unspecified lump in unspecified breast: Secondary | ICD-10-CM

## 2010-12-08 DIAGNOSIS — N61 Mastitis without abscess: Secondary | ICD-10-CM

## 2010-12-08 MED ORDER — OXYCODONE-ACETAMINOPHEN 5-325 MG PO TABS: 2.0000 | ORAL_TABLET | ORAL | Status: DC | PRN

## 2010-12-08 MED ORDER — SULFAMETHOXAZOLE-TRIMETHOPRIM 800-160 MG PO TABS: 1.0000 | ORAL_TABLET | Freq: Two times a day (BID) | ORAL | Status: AC

## 2010-12-08 NOTE — ED Provider Notes (Signed)
Medical screening examination/treatment/procedure(s) were performed by non-physician practitioner and as supervising physician I was immediately available for consultation/collaboration.   Gwyneth Sprout, MD 12/08/10 [phone]

## 2010-12-08 NOTE — Progress Notes (Signed)
Patient ID: Cassidy [name], female   DOB: 1965-05-23, 45 y.o.   MRN: [id number]  Chief Complaint  Patient presents with  . Other    eval of breast abscess that was drained today at the breast pt not responding to antibiotics     HPI Cassidy [name] is a 45 y.o. female.The patient presents at the request of Dr. Anselmo Pickler 2 right breast redness and pain and history of right breast mastitis and abscess. She underwent aspiration stay in the breast center. She has been on doxycycline for a number of days but has not improved. She is a red painful breast for about a week. She is a heavy smoker. She has been on doxycycline without improvement. HPI  Past Medical History  Diagnosis Date  . Hypertension   . Diabetes mellitus   . Depression   . GERD (gastroesophageal reflux disease)     Past Surgical History  Procedure Date  . Bladder surgery 1980    Family History  Problem Relation Age of Onset  . COPD Mother   . Cancer Mother     Social History History  Substance Use Topics  . Smoking status: Current Everyday Smoker -- 0.5 packs/day    Types: Cigarettes  . Smokeless tobacco: Never Used  . Alcohol Use: No    No Known Allergies  Current Outpatient Prescriptions  Medication Sig Dispense Refill  . buPROPion (WELLBUTRIN SR) 150 MG 12 hr tablet Take 150 mg by mouth daily.        . divalproex (DEPAKOTE ER) 500 MG 24 hr tablet Take 1,000 mg by mouth at bedtime.        Marland Kitchen lisinopril (PRINIVIL,ZESTRIL) 20 MG tablet Take 20 mg by mouth daily.        . metFORMIN (GLUCOPHAGE) 500 MG tablet Take 500 mg by mouth 2 (two) times daily with a meal.        . naproxen (NAPROSYN) 500 MG tablet Take 500 mg by mouth 2 (two) times daily with a meal.        . oxyCODONE-acetaminophen (PERCOCET) 5-325 MG per tablet Take 2 tablets by mouth every 4 (four) hours as needed for pain.  6 tablet  0  . PARoxetine (PAXIL-CR) 37.5 MG 24 hr tablet Take 37.5 mg by mouth every morning.        .  sulfamethoxazole-trimethoprim (BACTRIM DS) 800-160 MG per tablet Take 1 tablet by mouth 2 (two) times daily.  20 tablet  0    Review of Systems Review of Systems  Constitutional: Negative for fever and chills.  HENT: Negative.   Eyes: Negative.   Respiratory: Negative.   Cardiovascular: Negative.   Gastrointestinal: Negative.   Genitourinary: Negative.   Musculoskeletal: Negative.   Skin: Negative.   Neurological: Negative.   Psychiatric/Behavioral: Negative.     Blood pressure 126/80, pulse 80, temperature 97.4 F (36.3 C), temperature source Temporal, resp. rate 20, height 5\' 8"  (1.727 m), weight 249 lb 4 oz (113.059 kg), last menstrual period 11/28/2010.  Physical Exam Physical Exam  Constitutional: She is oriented to person, place, and time. She appears well-developed and well-nourished.  HENT:  Head: Normocephalic and atraumatic.  Eyes: EOM are normal. Pupils are equal, round, and reactive to light.  Neck: Normal range of motion. Neck supple.  Pulmonary/Chest:       Right breast shows mild to moderate erythema along the superior aspect of the nipple. Aspiration site noted. No residual fluid. No mass.  Musculoskeletal: Normal range of motion.  Neurological:  She is alert and oriented to person, place, and time.  Skin: Skin is warm and dry.  Psychiatric: She has a normal mood and affect. Her behavior is normal. Judgment and thought content normal.    Data Reviewed Right breast u/s shows 2 cm abscess aspirated today.  Assessment    Right breast mastitis and abscess    Plan     Change antibiotics to Bactrim DS b.i.d. for 10 days. She has been aspirated today. Return to clinic next week. I recommended a supportive bra and warm compresses. Mastitis instruction sheet given to patient. I have refilled her Percocet prescription. Return if symptoms worsen       Flay Ghosh A. 12/08/2010, 5:35 PM

## 2010-12-08 NOTE — Patient Instructions (Signed)
Mastitis  Mastitis is a breast infection that is results in pain, puffiness (swelling), redness, and warmth of the breast. Germs cause mastitis and can enter the skin through:  Breastfeeding.   Nipple piercing.   Cracks in the skin of the breast.  HOME CARE  Take all medicine as told by your doctor. An antibiotic medicine to kill the infection may be prescribed.   Keep your nipples clean and dry if you breastfeed. You may have you stop breastfeeding until the breast infection has gone away.   Do not use one breast to nurse your baby. Switch breasts when you breastfeed. Use different positions to breastfeed.   Avoid letting your breasts get overly filled with milk (engorged). Use a breast pump to empty your breasts.   Do not wear tight-fitting bras. Wear a good support bra.   A breastfeeding specialist (lactation consultant) can give you helpful tips on breastfeeding.  GET HELP RIGHT AWAY IF:  Your breast starts leaking a yellow or tan fluid.   The pain, puffiness, or redness in your breast gets worse.   You have a fever.  MAKE SURE YOU:   Understand these instructions.   Will watch your condition.   Will get help right away if you are not doing well or get worse.  Document Released: 12/16/2008 Document Revised: 09/09/2010 Document Reviewed: 12/16/2008 [facility] Patient Information 2012 [place], [place].

## 2010-12-14 ENCOUNTER — Ambulatory Visit (INDEPENDENT_AMBULATORY_CARE_PROVIDER_SITE_OTHER): Payer: Medicaid Other | Admitting: Surgery

## 2010-12-14 ENCOUNTER — Encounter (INDEPENDENT_AMBULATORY_CARE_PROVIDER_SITE_OTHER): Payer: Self-pay | Admitting: Surgery

## 2010-12-14 VITALS — BP 112/78 | HR 88 | Temp 96.9°F | Resp 16 | Ht 68.0 in | Wt 245.8 lb

## 2010-12-14 DIAGNOSIS — N61 Mastitis without abscess: Secondary | ICD-10-CM

## 2010-12-14 MED ORDER — SULFAMETHOXAZOLE-TMP DS 800-160 MG PO TABS: 1.0000 | ORAL_TABLET | Freq: Two times a day (BID) | ORAL | Status: DC

## 2010-12-14 MED ORDER — FLUCONAZOLE 100 MG PO TABS: 200.0000 mg | ORAL_TABLET | Freq: Every day | ORAL | Status: DC

## 2010-12-14 NOTE — Patient Instructions (Signed)
Stop smoking.  Continue antibiotics.  Return 1 week.

## 2010-12-14 NOTE — Progress Notes (Signed)
Patient ID: Cassidy [name], female   DOB: May 31, 1965, 45 y.o.   MRN: [id number]  Chief Complaint  Patient presents with  . Follow-up    reck br mastitis rt br    HPI Cassidy [name] is a 45 y.o. female.The patient presents at the request of Dr. Anselmo Pickler 2 right breast redness and pain and history of right breast mastitis and abscess. She underwent aspiration  in the breast center. She has been on doxycycline for a number of days but has not improved. She is a red painful breast for about a week. She is a heavy smoker. She has been on doxycycline without improvement. HPI  Past Medical History  Diagnosis Date  . Hypertension   . Diabetes mellitus   . Depression   . GERD (gastroesophageal reflux disease)   . Breast abscess     Past Surgical History  Procedure Date  . Bladder surgery 1980  . Incise and drain abcess     rt br abscess    Family History  Problem Relation Age of Onset  . COPD Mother   . Cancer Mother     lymphoma  . Cancer Maternal Aunt     breast, colon    Social History History  Substance Use Topics  . Smoking status: Current Everyday Smoker -- 0.2 packs/day    Types: Cigarettes  . Smokeless tobacco: Never Used  . Alcohol Use: No    No Known Allergies  Current Outpatient Prescriptions  Medication Sig Dispense Refill  . buPROPion (WELLBUTRIN SR) 150 MG 12 hr tablet Take 150 mg by mouth daily.        . divalproex (DEPAKOTE ER) 500 MG 24 hr tablet Take 1,000 mg by mouth at bedtime.        Marland Kitchen lisinopril (PRINIVIL,ZESTRIL) 20 MG tablet Take 20 mg by mouth daily.        . metFORMIN (GLUCOPHAGE) 500 MG tablet Take 500 mg by mouth 2 (two) times daily with a meal.        . naproxen (NAPROSYN) 500 MG tablet Take 500 mg by mouth 2 (two) times daily with a meal.        . oxyCODONE-acetaminophen (PERCOCET) 5-325 MG per tablet Take 2 tablets by mouth every 4 (four) hours as needed for pain.  6 tablet  0  . PARoxetine (PAXIL-CR) 37.5 MG 24 hr tablet Take 37.5 mg by  mouth every morning.          Review of Systems Review of Systems  Constitutional: Negative for fever and chills.  HENT: Negative.   Eyes: Negative.   Respiratory: Negative.   Cardiovascular: Negative.   Gastrointestinal: Negative.   Genitourinary: Negative.   Musculoskeletal: Negative.   Skin: Negative.   Neurological: Negative.   Psychiatric/Behavioral: Negative.     Blood pressure 112/78, pulse 88, temperature 96.9 F (36.1 C), temperature source Temporal, resp. rate 16, height 5\' 8"  (1.727 m), weight 245 lb 12.8 oz (111.494 kg), last menstrual period 11/28/2010.  Physical Exam Physical Exam  Constitutional: She is oriented to person, place, and time. She appears well-developed and well-nourished.  HENT:  Head: Normocephalic and atraumatic.  Eyes: EOM are normal. Pupils are equal, round, and reactive to light.  Neck: Normal range of motion. Neck supple.  Pulmonary/Chest:       Right breast less red.  No abscess.  Improved..  Musculoskeletal: Normal range of motion.  Neurological: She is alert and oriented to person, place, and time.  Skin: Skin is warm  and dry.  Psychiatric: She has a normal mood and affect. Her behavior is normal. Judgment and thought content normal.    Data Reviewed none.  Assessment    Right breast mastitis and abscess    Plan     Change antibiotics to Bactrim DS b.i.d. for 10 more  Days.  Will give diflucan to prevent yeast infection on ABX.  She is better.  I asked her to stop smoking.  RTC 1 week.    Adaley Kiene A. 12/14/2010, 11:52 AM

## 2010-12-17 ENCOUNTER — Telehealth (INDEPENDENT_AMBULATORY_CARE_PROVIDER_SITE_OTHER): Payer: Self-pay

## 2010-12-17 NOTE — Telephone Encounter (Signed)
Pt seen this week for right breast mastitis by Dr. Luisa Hart.  She was placed on Bactrim and given Percocet 5/325 for pain.  She calls today to c/o persistent pain that is not helped by the Percocet.  She said the breast is burning and painful and she feels worse.  Please advise.

## 2010-12-18 ENCOUNTER — Ambulatory Visit (INDEPENDENT_AMBULATORY_CARE_PROVIDER_SITE_OTHER): Payer: Medicaid Other | Admitting: Surgery

## 2010-12-18 ENCOUNTER — Encounter (INDEPENDENT_AMBULATORY_CARE_PROVIDER_SITE_OTHER): Payer: Self-pay | Admitting: Surgery

## 2010-12-18 ENCOUNTER — Inpatient Hospital Stay (HOSPITAL_COMMUNITY)
Admission: AD | Admit: 2010-12-18 | Discharge: 2010-12-21 | DRG: 584 | Disposition: A | Discharge status: Home Health | Payer: Medicaid Other | Source: Ambulatory Visit | Attending: Surgery | Admitting: Surgery

## 2010-12-18 ENCOUNTER — Encounter (HOSPITAL_COMMUNITY): Payer: Self-pay | Admitting: Surgery

## 2010-12-18 VITALS — BP 118/78 | HR 68 | Temp 97.8°F | Resp 16 | Ht 68.0 in | Wt 244.4 lb

## 2010-12-18 DIAGNOSIS — B360 Pityriasis versicolor: Secondary | ICD-10-CM | POA: Diagnosis present

## 2010-12-18 DIAGNOSIS — J45909 Unspecified asthma, uncomplicated: Secondary | ICD-10-CM | POA: Diagnosis present

## 2010-12-18 DIAGNOSIS — F3130 Bipolar disorder, current episode depressed, mild or moderate severity, unspecified: Secondary | ICD-10-CM | POA: Diagnosis present

## 2010-12-18 DIAGNOSIS — Z780 Asymptomatic menopausal state: Secondary | ICD-10-CM

## 2010-12-18 DIAGNOSIS — E7800 Pure hypercholesterolemia, unspecified: Secondary | ICD-10-CM | POA: Diagnosis present

## 2010-12-18 DIAGNOSIS — N61 Mastitis without abscess: Secondary | ICD-10-CM

## 2010-12-18 DIAGNOSIS — N3010 Interstitial cystitis (chronic) without hematuria: Secondary | ICD-10-CM | POA: Diagnosis present

## 2010-12-18 DIAGNOSIS — K219 Gastro-esophageal reflux disease without esophagitis: Secondary | ICD-10-CM | POA: Diagnosis present

## 2010-12-18 DIAGNOSIS — E119 Type 2 diabetes mellitus without complications: Secondary | ICD-10-CM | POA: Diagnosis present

## 2010-12-18 DIAGNOSIS — F1910 Other psychoactive substance abuse, uncomplicated: Secondary | ICD-10-CM | POA: Diagnosis present

## 2010-12-18 DIAGNOSIS — A6000 Herpesviral infection of urogenital system, unspecified: Secondary | ICD-10-CM | POA: Diagnosis present

## 2010-12-18 DIAGNOSIS — N63 Unspecified lump in unspecified breast: Secondary | ICD-10-CM | POA: Diagnosis present

## 2010-12-18 DIAGNOSIS — N611 Abscess of the breast and nipple: Secondary | ICD-10-CM | POA: Insufficient documentation

## 2010-12-18 DIAGNOSIS — J309 Allergic rhinitis, unspecified: Secondary | ICD-10-CM | POA: Diagnosis present

## 2010-12-18 LAB — COMPREHENSIVE METABOLIC PANEL
ALT: 14 U/L (ref 0–35)
Albumin: 4.1 g/dL (ref 3.5–5.2)
Alkaline Phosphatase: 81 U/L (ref 39–117)
Glucose, Bld: 96 mg/dL (ref 70–99)
Potassium: 4.4 mEq/L (ref 3.5–5.1)
Sodium: 133 mEq/L — ABNORMAL LOW (ref 135–145)
Total Protein: 8.3 g/dL (ref 6.0–8.3)

## 2010-12-18 LAB — DIFFERENTIAL
Eosinophils Absolute: 0.2 10*3/uL (ref 0.0–0.7)
Eosinophils Relative: 1 % (ref 0–5)
Lymphs Abs: 4.4 10*3/uL — ABNORMAL HIGH (ref 0.7–4.0)
Monocytes Absolute: 0.7 10*3/uL (ref 0.1–1.0)
Monocytes Relative: 6 % (ref 3–12)

## 2010-12-18 LAB — GLUCOSE, CAPILLARY
Glucose-Capillary: 180 mg/dL — ABNORMAL HIGH (ref 70–99)
Glucose-Capillary: 222 mg/dL — ABNORMAL HIGH (ref 70–99)

## 2010-12-18 LAB — CBC
Hemoglobin: 12.9 g/dL (ref 12.0–15.0)
MCHC: 35.5 g/dL (ref 30.0–36.0)

## 2010-12-18 MED ORDER — POTASSIUM CHLORIDE IN NACL 20-0.45 MEQ/L-% IV SOLN
INTRAVENOUS | Status: DC
  Administered 2010-12-18: 20:00:00 via INTRAVENOUS
  Filled 2010-12-18 (×4): qty 1000

## 2010-12-18 MED ORDER — PIPERACILLIN-TAZOBACTAM 3.375 G IVPB
3.3750 g | Freq: Three times a day (TID) | INTRAVENOUS | Status: DC
  Administered 2010-12-19: 3.375 g via INTRAVENOUS
  Filled 2010-12-18 (×4): qty 50

## 2010-12-18 MED ORDER — PIPERACILLIN-TAZOBACTAM 3.375 G IVPB
3.3750 g | Freq: Once | INTRAVENOUS | Status: AC
  Administered 2010-12-18: 3.375 g via INTRAVENOUS
  Filled 2010-12-18: qty 50

## 2010-12-18 MED ORDER — INSULIN ASPART 100 UNIT/ML ~~LOC~~ SOLN
0.0000 [IU] | SUBCUTANEOUS | Status: DC
Start: 2010-12-18 — End: 2010-12-21
  Administered 2010-12-18 – 2010-12-19 (×2): 3 [IU] via SUBCUTANEOUS
  Administered 2010-12-19: 2 [IU] via SUBCUTANEOUS
  Administered 2010-12-20: 3 [IU] via SUBCUTANEOUS
  Administered 2010-12-20: 2 [IU] via SUBCUTANEOUS
  Filled 2010-12-18 (×2): qty 3

## 2010-12-18 MED ORDER — MORPHINE SULFATE 2 MG/ML IJ SOLN
1.0000 mg | INTRAMUSCULAR | Status: DC | PRN
  Administered 2010-12-19 – 2010-12-20 (×4): 2 mg via INTRAVENOUS
  Administered 2010-12-20 (×2): 4 mg via INTRAVENOUS
  Administered 2010-12-20: 2 mg via INTRAVENOUS
  Administered 2010-12-21: 4 mg via INTRAVENOUS
  Filled 2010-12-18: qty 2
  Filled 2010-12-18: qty 1
  Filled 2010-12-18: qty 2
  Filled 2010-12-18: qty 1
  Filled 2010-12-18: qty 2
  Filled 2010-12-18: qty 1
  Filled 2010-12-18 (×2): qty 2
  Filled 2010-12-18: qty 1

## 2010-12-18 MED ORDER — MORPHINE SULFATE 2 MG/ML IJ SOLN
INTRAMUSCULAR | Status: AC
  Administered 2010-12-18: 2 mg via INTRAVENOUS
  Filled 2010-12-18: qty 1

## 2010-12-18 NOTE — H&P (Signed)
Cassidy [name] is an 45 y.o. female.   Chief Complaint:Right breast abscess  HPI: 45 yo female followed for the last two weeks for right breast abscess s/p ultrasound-guided aspiration.  Now with worsening pain superior and lateral to her nipple with a tennis-ball-sized palpable mass and some erythema.  She is being admitted from the Urgent Office for probable surgical drainage.  Past Medical History  Diagnosis Date  . Hypertension   . Depression   . GERD (gastroesophageal reflux disease)   . Breast abscess     right  . Diabetes mellitus     type 2    Past Surgical History  Procedure Date  . Bladder surgery 1980  . Incise and drain abcess     rt br abscess    Family History  Problem Relation Age of Onset  . COPD Mother   . Cancer Mother     lymphoma  . Cancer Maternal Aunt     breast, colon   Social History:  reports that she has been smoking Cigarettes.  She has been smoking about .25 packs per day. She has never used smokeless tobacco. She reports that she does not drink alcohol or use illicit drugs.  Allergies: No Known Allergies  Medications Prior to Admission  Medication Sig Dispense Refill  . buPROPion (WELLBUTRIN SR) 150 MG 12 hr tablet Take 150 mg by mouth daily.        . divalproex (DEPAKOTE ER) 500 MG 24 hr tablet Take 1,000 mg by mouth at bedtime.        . fluconazole (DIFLUCAN) 100 MG tablet Take 2 tablets (200 mg total) by mouth daily.  5 tablet  0  . lisinopril (PRINIVIL,ZESTRIL) 20 MG tablet Take 20 mg by mouth daily.        . metFORMIN (GLUCOPHAGE) 500 MG tablet Take 500 mg by mouth 2 (two) times daily with a meal.        . naproxen (NAPROSYN) 500 MG tablet Take 500 mg by mouth 2 (two) times daily with a meal.        . oxyCODONE-acetaminophen (PERCOCET) 5-325 MG per tablet Take 2 tablets by mouth every 4 (four) hours as needed for pain.  6 tablet  0  . PARoxetine (PAXIL-CR) 37.5 MG 24 hr tablet Take 37.5 mg by mouth every morning.        .  sulfamethoxazole-trimethoprim (BACTRIM DS) 800-160 MG per tablet Take 1 tablet by mouth 2 (two) times daily.  20 tablet  0   No current facility-administered medications on file as of 12/18/2010.    No results found for this or any previous visit (from the past 48 hour(s)). No results found.  ROS Otherwise negative Blood pressure 118/78, pulse 68, temperature 97.8 F (36.6 C), temperature source Temporal, resp. rate 16, height 5\' 8"  (1.727 m), weight 244 lb 6.4 oz (110.859 kg), last menstrual period 11/28/2010. Physical Exam  WDWN in NAD HEENT:  EOMI, sclera anicteric Neck:  No masses, no thyromegaly Right breast - pendulous; exquisitely tender superior and lateral to right nipple with approx 5 cm palpable firmness; this is fairly deep; slight erythema to the overlying skin Right axilla - some shotty lymphadenopathy Lungs:  CTA bilaterally; normal respiratory effort CV:  Regular rate and rhythm; no murmurs Abd:  +bowel sounds, soft, non-tender, no masses Ext:  Well-perfused; no edema Skin:  Warm, dry; no sign of jaundice  Assessment/Plan Right breast abscess - not responsive to percutaneous aspiration and PO antibiotics Direct admit for  IV antibiotics and possible surgical drainage.  I have discussed with Dr. Luisa Hart.  Lataya Varnell K. 12/18/2010, 4:44 PM

## 2010-12-18 NOTE — Patient Instructions (Signed)
Direct admit for I&D of right breast abscess

## 2010-12-18 NOTE — Progress Notes (Signed)
Filed Vitals:   12/18/10 1556  BP: 118/78  Pulse: 68  Temp: 97.8 F (36.6 C)  Resp: 16   Her breast seems more tender, with a firm palpable mass superior and lateral to the right nipple.  She has some overlying faint erythema.  This area seems about 5 cm in diameter and is exquisitely tender.  She has some palpable axillary lymph nodes.   Imp:  Right breast abscess Plan:  Will admit the patient to the hospital for IV antibiotics and drainage under anesthesia. I have spoken with Dr. Luisa Hart.  Wilmon Arms. Corliss Skains, MD, [facility]  12/18/2010 4:39 PM

## 2010-12-18 NOTE — Progress Notes (Signed)
ANTIBIOTIC CONSULT NOTE - INITIAL  Pharmacy Consult:  Zosyn Indication: R breast abscess  No Known Allergies  Patient Measurements:   Vital Signs: Temp: 97.8 F (36.6 C) (12/07 1556) Temp src: Temporal (12/07 1556) BP: 118/78 mmHg (12/07 1556) Pulse Rate: 68  (12/07 1556)     Labs:  [facility] 12/18/10 1905  WBC 13.2*  HGB 12.9  PLT 393  LABCREA --  CREATININE 0.77   The CrCl is unknown because both a height and weight (above a minimum accepted value) are required for this calculation. No results found for this basename: VANCOTROUGH:2,VANCOPEAK:2,VANCORANDOM:2,GENTTROUGH:2,GENTPEAK:2,GENTRANDOM:2,TOBRATROUGH:2,TOBRAPEAK:2,TOBRARND:2,AMIKACINPEAK:2,AMIKACINTROU:2,AMIKACIN:2, in the last 72 hours   Microbiology: No results found for this or any previous visit (from the past 720 hour(s)).  Medical History: Past Medical History  Diagnosis Date  . Hypertension   . Depression   . GERD (gastroesophageal reflux disease)   . Breast abscess     right  . Diabetes mellitus     type 2    Assessment: 76 YOF with R breast abscess, s/p aspiration and now with some erythema.  Patient admitted for drainage and pharmacy consulted to dose Zosyn.  Patient with CrCL > 20 ml/min, so will infuse Zosyn over 4 hours.   Plan:  1.  Zosyn 3.375gm IV Q8H. 2.  Monitor renal fxn, clinical course.  Phillips Climes Dien 12/18/2010,8:51 PM

## 2010-12-19 ENCOUNTER — Encounter (HOSPITAL_COMMUNITY): Payer: Self-pay | Admitting: Anesthesiology

## 2010-12-19 ENCOUNTER — Other Ambulatory Visit: Payer: Self-pay

## 2010-12-19 ENCOUNTER — Inpatient Hospital Stay (HOSPITAL_COMMUNITY): Payer: Medicaid Other | Admitting: Anesthesiology

## 2010-12-19 ENCOUNTER — Encounter (HOSPITAL_COMMUNITY): Admission: AD | Disposition: A | Discharge status: Home Health | Payer: Self-pay | Source: Ambulatory Visit

## 2010-12-19 ENCOUNTER — Other Ambulatory Visit (INDEPENDENT_AMBULATORY_CARE_PROVIDER_SITE_OTHER): Payer: Self-pay | Admitting: General Surgery

## 2010-12-19 DIAGNOSIS — N61 Mastitis without abscess: Secondary | ICD-10-CM

## 2010-12-19 HISTORY — PX: IRRIGATION AND DEBRIDEMENT ABSCESS: SHX5252

## 2010-12-19 LAB — GLUCOSE, CAPILLARY
Glucose-Capillary: 110 mg/dL — ABNORMAL HIGH (ref 70–99)
Glucose-Capillary: 118 mg/dL — ABNORMAL HIGH (ref 70–99)

## 2010-12-19 LAB — AFB CULTURE WITH SMEAR (NOT AT ARMC)

## 2010-12-19 SURGERY — IRRIGATION AND DEBRIDEMENT ABSCESS
Anesthesia: General | Site: Breast | Laterality: Right | Wound class: Clean

## 2010-12-19 MED ORDER — ONDANSETRON HCL 4 MG/2ML IJ SOLN: 4.0000 mg | Freq: Four times a day (QID) | INTRAMUSCULAR | Status: DC | PRN

## 2010-12-19 MED ORDER — BUPROPION HCL ER (SR) 150 MG PO TB12
150.0000 mg | ORAL_TABLET | Freq: Every day | ORAL | Status: DC
  Administered 2010-12-19 – 2010-12-20 (×2): 150 mg via ORAL
  Filled 2010-12-19 (×4): qty 1

## 2010-12-19 MED ORDER — NAPROXEN 500 MG PO TABS
500.0000 mg | ORAL_TABLET | Freq: Two times a day (BID) | ORAL | Status: DC
  Administered 2010-12-19 – 2010-12-21 (×3): 500 mg via ORAL
  Filled 2010-12-19 (×7): qty 1

## 2010-12-19 MED ORDER — LISINOPRIL 20 MG PO TABS
20.0000 mg | ORAL_TABLET | Freq: Every day | ORAL | Status: DC
  Administered 2010-12-19 – 2010-12-20 (×2): 20 mg via ORAL
  Filled 2010-12-19 (×3): qty 1

## 2010-12-19 MED ORDER — ONDANSETRON HCL 4 MG/2ML IJ SOLN
INTRAMUSCULAR | Status: DC | PRN
  Administered 2010-12-19: 4 mg via INTRAVENOUS

## 2010-12-19 MED ORDER — PAROXETINE HCL ER 37.5 MG PO TB24
37.5000 mg | ORAL_TABLET | ORAL | Status: DC
  Administered 2010-12-20 – 2010-12-21 (×2): 37.5 mg via ORAL
  Filled 2010-12-19 (×4): qty 1

## 2010-12-19 MED ORDER — MIDAZOLAM HCL 5 MG/5ML IJ SOLN
INTRAMUSCULAR | Status: DC | PRN
  Administered 2010-12-19: 2 mg via INTRAVENOUS

## 2010-12-19 MED ORDER — OXYCODONE-ACETAMINOPHEN 5-325 MG PO TABS
1.0000 | ORAL_TABLET | Freq: Four times a day (QID) | ORAL | Status: DC | PRN
  Administered 2010-12-19 – 2010-12-21 (×5): 2 via ORAL
  Filled 2010-12-19 (×5): qty 2

## 2010-12-19 MED ORDER — POTASSIUM CHLORIDE IN NACL 20-0.45 MEQ/L-% IV SOLN
INTRAVENOUS | Status: DC
  Administered 2010-12-19 – 2010-12-20 (×2): via INTRAVENOUS
  Filled 2010-12-19 (×3): qty 1000

## 2010-12-19 MED ORDER — FENTANYL CITRATE 0.05 MG/ML IJ SOLN
INTRAMUSCULAR | Status: DC | PRN
  Administered 2010-12-19: 75 ug via INTRAVENOUS
  Administered 2010-12-19: 50 ug via INTRAVENOUS
  Administered 2010-12-19: 25 ug via INTRAVENOUS
  Administered 2010-12-19 (×2): 50 ug via INTRAVENOUS

## 2010-12-19 MED ORDER — HYDROMORPHONE HCL PF 1 MG/ML IJ SOLN
INTRAMUSCULAR | Status: AC
  Filled 2010-12-19: qty 1

## 2010-12-19 MED ORDER — PROPOFOL 10 MG/ML IV EMUL
INTRAVENOUS | Status: DC | PRN
  Administered 2010-12-19: 190 mg via INTRAVENOUS

## 2010-12-19 MED ORDER — EXENATIDE 5 MCG/0.02ML ~~LOC~~ SOPN: 5.0000 ug | PEN_INJECTOR | Freq: Two times a day (BID) | SUBCUTANEOUS | Status: DC

## 2010-12-19 MED ORDER — LACTATED RINGERS IV SOLN
INTRAVENOUS | Status: DC | PRN
  Administered 2010-12-19: 11:00:00 via INTRAVENOUS

## 2010-12-19 MED ORDER — DIVALPROEX SODIUM ER 500 MG PO TB24
1000.0000 mg | ORAL_TABLET | Freq: Every day | ORAL | Status: DC
  Administered 2010-12-19 – 2010-12-20 (×2): 1000 mg via ORAL
  Filled 2010-12-19 (×3): qty 2

## 2010-12-19 MED ORDER — METFORMIN HCL 500 MG PO TABS
500.0000 mg | ORAL_TABLET | Freq: Two times a day (BID) | ORAL | Status: DC
  Administered 2010-12-19 – 2010-12-21 (×3): 500 mg via ORAL
  Filled 2010-12-19 (×6): qty 1

## 2010-12-19 MED ORDER — HYDROMORPHONE HCL PF 1 MG/ML IJ SOLN
0.2500 mg | INTRAMUSCULAR | Status: DC | PRN
  Administered 2010-12-19 (×3): 0.5 mg via INTRAVENOUS

## 2010-12-19 SURGICAL SUPPLY — 46 items
BANDAGE GAUZE ELAST BULKY 4 IN (GAUZE/BANDAGES/DRESSINGS) IMPLANT
BINDER BREAST XXLRG (GAUZE/BANDAGES/DRESSINGS) ×1 IMPLANT
BLADE SURG 10 STRL SS (BLADE) ×2 IMPLANT
BLADE SURG 15 STRL LF DISP TIS (BLADE) ×1 IMPLANT
BLADE SURG 15 STRL SS (BLADE) ×2
CANISTER SUCTION 2500CC (MISCELLANEOUS) IMPLANT
CHLORAPREP W/TINT 26ML (MISCELLANEOUS) IMPLANT
CLEANER TIP ELECTROSURG 2X2 (MISCELLANEOUS) ×2 IMPLANT
CLOTH BEACON ORANGE TIMEOUT ST (SAFETY) ×2 IMPLANT
COVER SURGICAL LIGHT HANDLE (MISCELLANEOUS) ×2 IMPLANT
DECANTER SPIKE VIAL GLASS SM (MISCELLANEOUS) IMPLANT
DRAPE EXTREMITY T 121X128X90 (DRAPE) IMPLANT
DRAPE LAPAROSCOPIC ABDOMINAL (DRAPES) ×2 IMPLANT
DRSG PAD ABDOMINAL 8X10 ST (GAUZE/BANDAGES/DRESSINGS) ×1 IMPLANT
ELECT REM PT RETURN 9FT ADLT (ELECTROSURGICAL) ×2
ELECTRODE REM PT RTRN 9FT ADLT (ELECTROSURGICAL) ×1 IMPLANT
GAUZE KERLIX 2  STERILE LF (GAUZE/BANDAGES/DRESSINGS) ×1 IMPLANT
GAUZE SPONGE 4X4 12PLY STRL LF (GAUZE/BANDAGES/DRESSINGS) ×1 IMPLANT
GAUZE SPONGE 4X4 16PLY XRAY LF (GAUZE/BANDAGES/DRESSINGS) IMPLANT
GLOVE BIO SURGEON STRL SZ7 (GLOVE) ×2 IMPLANT
GLOVE BIOGEL PI IND STRL 7.5 (GLOVE) ×1 IMPLANT
GLOVE BIOGEL PI INDICATOR 7.5 (GLOVE) ×1
GOWN STRL NON-REIN LRG LVL3 (GOWN DISPOSABLE) ×4 IMPLANT
KIT BASIN OR (CUSTOM PROCEDURE TRAY) ×2 IMPLANT
KIT ROOM TURNOVER OR (KITS) ×2 IMPLANT
NDL HYPO 25X1 1.5 SAFETY (NEEDLE) IMPLANT
NEEDLE HYPO 25X1 1.5 SAFETY (NEEDLE) IMPLANT
NS IRRIG 1000ML POUR BTL (IV SOLUTION) ×2 IMPLANT
PACK SURGICAL SETUP 50X90 (CUSTOM PROCEDURE TRAY) ×2 IMPLANT
PAD ARMBOARD 7.5X6 YLW CONV (MISCELLANEOUS) ×4 IMPLANT
PENCIL BUTTON HOLSTER BLD 10FT (ELECTRODE) ×2 IMPLANT
SPECIMEN JAR SMALL (MISCELLANEOUS) ×2 IMPLANT
SPONGE GAUZE 4X4 12PLY (GAUZE/BANDAGES/DRESSINGS) ×2 IMPLANT
SPONGE LAP 18X18 X RAY DECT (DISPOSABLE) ×2 IMPLANT
SUT MNCRL AB 4-0 PS2 18 (SUTURE) IMPLANT
SUT VIC AB 3-0 SH 27 (SUTURE)
SUT VIC AB 3-0 SH 27XBRD (SUTURE) IMPLANT
SWAB COLLECTION DEVICE MRSA (MISCELLANEOUS) IMPLANT
SYR BULB 3OZ (MISCELLANEOUS) ×2 IMPLANT
SYR CONTROL 10ML LL (SYRINGE) IMPLANT
TOWEL OR 17X24 6PK STRL BLUE (TOWEL DISPOSABLE) ×2 IMPLANT
TOWEL OR 17X26 10 PK STRL BLUE (TOWEL DISPOSABLE) ×2 IMPLANT
TUBE ANAEROBIC SPECIMEN COL (MISCELLANEOUS) IMPLANT
TUBE CONNECTING 12X1/4 (SUCTIONS) IMPLANT
WATER STERILE IRR 1000ML POUR (IV SOLUTION) IMPLANT
YANKAUER SUCT BULB TIP NO VENT (SUCTIONS) IMPLANT

## 2010-12-19 NOTE — Transfer of Care (Signed)
Immediate Anesthesia Transfer of Care Note  Patient: Cassidy [name]  Procedure(s) Performed:  IRRIGATION AND DEBRIDEMENT ABSCESS  Patient Location: PACU  Anesthesia Type: General  Level of Consciousness: awake and patient cooperative  Airway & Oxygen Therapy: Patient Spontanous Breathing and Patient connected to nasal cannula oxygen  Post-op Assessment: Report given to PACU RN, Post -op Vital signs reviewed and stable, Patient moving all extremities   Post vital signs: Reviewed and stable  Complications: No apparent anesthesia complications

## 2010-12-19 NOTE — Anesthesia Preprocedure Evaluation (Addendum)
Anesthesia Evaluation  Patient identified by MRN, date of birth, ID band Patient awake    Reviewed: Allergy & Precautions, H&P , NPO status , Patient's Chart, lab work & pertinent test results  Airway Mallampati: II  Neck ROM: full    Dental  (+) Teeth Intact and Dental Advisory Given   Pulmonary asthma ,          Cardiovascular hypertension, Pt. on medications     Neuro/Psych PSYCHIATRIC DISORDERS Depression    GI/Hepatic GERD-  Medicated and Controlled,  Endo/Other  Diabetes mellitus-, Poorly Controlled, Oral Hypoglycemic Agents  Renal/GU      Musculoskeletal   Abdominal   Peds  Hematology   Anesthesia Other Findings   Reproductive/Obstetrics                          Anesthesia Physical Anesthesia Plan  ASA: II  Anesthesia Plan: General   Post-op Pain Management:    Induction: Intravenous  Airway Management Planned: LMA  Additional Equipment:   Intra-op Plan:   Post-operative Plan:   Informed Consent: I have reviewed the patients History and Physical, chart, labs and discussed the procedure including the risks, benefits and alternatives for the proposed anesthesia with the patient or authorized representative who has indicated his/her understanding and acceptance.     Plan Discussed with: CRNA and Surgeon  Anesthesia Plan Comments:         Anesthesia Quick Evaluation

## 2010-12-19 NOTE — Progress Notes (Signed)
Unchanged overnight I have seen and examined patient, she has large right breast abscess Discussed risks and postop course of incision and drainage with open wound of right breast abscess failing medical therapy. Will proceed this am.

## 2010-12-19 NOTE — Op Note (Signed)
Preoperative diagnosis: Chronic right breast abscess Postoperative diagnosis: Same as above  Procedure: Incision, drainage, and biopsy of chronic right breast abscess Surgeon: Dr. Harden Mo Anesthesia: Gen. With LMA Estimated blood loss: Minimal Drains: None, wound left open and packed Complications: None Sponge and needle count was correct x2 at end of operation Specimens: #1 cultures #2 right breast tissue for pathology Disposition: To recovery room in stable condition  Indications This is a 45 year old female who has a history of diabetes and has had a chronic right breast abscess that is attempted to be managed conservatively. This has failed. She was admitted yesterday by one of my partners and I discussed going to the operating room today to drain her breast abscess. I don't think she has any other choice at this point.  Procedure: She was on intravenous Zosyn on the floor. She was taken to the operating room and sequential compression devices placed on her legs prior to induction of anesthesia. She was then placed under general anesthesia with an LMA. Her right breast was prepped and draped in the standard sterile surgical fashion. A surgical timeout was then performed.  I made a crescent-shaped incision and excised a portion of the skin and a periareolar fashion. I then carried this down to the chronic hard mass it was felt that extended to the upper pole of her breast all the way underneath her nipple. I entered into this mass and I removed a portion of the thick tissue and sent this for pathology. I also drained a very large abscess was a fair amount of purulent fluid. I took cultures of this. I irrigated this copiously. I then packed this with Kerlix gauze. A dressing was placed as well as a breast binder. She was extubated and transferred to recovery in stable condition.

## 2010-12-19 NOTE — Preoperative (Signed)
Beta Blockers   Reason not to administer Beta Blockers:Not Applicable 

## 2010-12-19 NOTE — Anesthesia Postprocedure Evaluation (Signed)
Anesthesia Post Note  Patient: Cassidy [name]  Procedure(s) Performed:  IRRIGATION AND DEBRIDEMENT ABSCESS  Anesthesia type: General  Patient location: PACU  Post pain: Pain level controlled and Adequate analgesia  Post assessment: Post-op Vital signs reviewed, Patient's Cardiovascular Status Stable, Respiratory Function Stable, Patent Airway and Pain level controlled  Last Vitals:  Filed Vitals:   12/19/10 1330  BP:   Pulse: 74  Temp:   Resp: 13    Post vital signs: Reviewed and stable  Level of consciousness: awake, alert  and oriented  Complications: No apparent anesthesia complications

## 2010-12-20 LAB — GLUCOSE, CAPILLARY
Glucose-Capillary: 142 mg/dL — ABNORMAL HIGH (ref 70–99)
Glucose-Capillary: 193 mg/dL — ABNORMAL HIGH (ref 70–99)
Glucose-Capillary: 106 mg/dL — ABNORMAL HIGH (ref 70–99)

## 2010-12-20 NOTE — Progress Notes (Signed)
1 Day Post-Op  Subjective: Still having a lot of pain  Objective: Vital signs in last 24 hours: Temp:  [97.8 F (36.6 C)-98.8 F (37.1 C)] 98.8 F (37.1 C) (12/09 0550) Pulse Rate:  [74-93] 80  (12/09 0550) Resp:  [4-21] 16  (12/09 0550) BP: (92-118)/(48-72) 98/48 mmHg (12/09 0550) SpO2:  [91 %-100 %] 100 % (12/09 0550) Last BM Date: 12/17/10  Intake/Output from previous day: 12/08 0701 - 12/09 0700 In: 2680 [P.O.:1680; I.V.:1000] Out: -  Intake/Output this shift:    Chest wall: no tenderness, open wound relatively clean. very painful. no cellulitis  Lab Results:   [facility] 12/18/10 1905  WBC 13.2*  HGB 12.9  HCT 36.3  PLT 393   BMET  Basename 12/18/10 1905  NA 133*  K 4.4  CL 100  CO2 23  GLUCOSE 96  BUN 12  CREATININE 0.77  CALCIUM 9.5   PT/INR No results found for this basename: LABPROT:2,INR:2 in the last 72 hours ABG No results found for this basename: PHART:2,PCO2:2,PO2:2,HCO3:2 in the last 72 hours  Studies/Results: No results found.  Anti-infectives: Anti-infectives     Start     Dose/Rate Route Frequency Ordered Stop   12/19/10 0400   piperacillin-tazobactam (ZOSYN) IVPB 3.375 g  Status:  Discontinued        3.375 g 12.5 mL/hr over 240 Minutes Intravenous 3 times per day 12/18/10 2055 12/19/10 1554   12/18/10 1815   piperacillin-tazobactam (ZOSYN) IVPB 3.375 g        3.375 g 12.5 mL/hr over 240 Minutes Intravenous  Once 12/18/10 1803 12/18/10 2334          Assessment/Plan: s/p Procedure(s): IRRIGATION AND DEBRIDEMENT ABSCESS Continue ABX therapy due to Post-op infection Continue dressing changes and pain control  LOS: 2 days    TOTH III,PAUL S 12/20/2010

## 2010-12-20 NOTE — Progress Notes (Signed)
Pt premedicated before procedure. Pt is calm but anxious about procedure. Surgical packing removed until no more packing seen. Visual check shows all packing removed. New packing placed. Damp 4x4 x 2 packed into surgical incision . Covered with dry 4x4 and abd pad. Pt tolerated procedure poorly but in good spirits. Her pain level started at a 10 as soon as I began removing packing. Stopped the procedure to administer another dose of pain medicine. Pt pain level decreased only after procedure complete. Patient is very addiment that she will be unable to perform this procedure at home. She is still in good spirits but anxious about the outcome. Laure Kidney Culberson

## 2010-12-21 ENCOUNTER — Telehealth (INDEPENDENT_AMBULATORY_CARE_PROVIDER_SITE_OTHER): Payer: Self-pay

## 2010-12-21 ENCOUNTER — Other Ambulatory Visit: Payer: Medicaid Other

## 2010-12-21 MED ORDER — OXYCODONE-ACETAMINOPHEN 5-325 MG PO TABS: 1.0000 | ORAL_TABLET | ORAL | Status: DC | PRN

## 2010-12-21 NOTE — Addendum Note (Signed)
Addendum  created 12/21/10 1212 by Adair Laundry   Modules edited:Anesthesia Responsible Staff

## 2010-12-21 NOTE — Progress Notes (Signed)
   CARE MANAGEMENT NOTE 12/21/2010  Patient:  VILLA, BURGIN   Account Number:  [id number]  Date Initiated:  12/21/2010  Documentation initiated by:  Carlyle Lipa  Subjective/Objective Assessment:   breast abscess; I&D, IV abx.     Action/Plan:   Home with HHRN to assist with dressing changes. Pt reports that neighbor is Charity fundraiser and can also assist. Address and phone in Epic confirmed as correct.    Anticipated DC Date:  12/21/2010   Anticipated DC Plan:  HOME W HOME HEALTH SERVICES      DC Planning Services  CM consult      [facility] Choice  HOME HEALTH   Choice offered to / List presented to:  C-1 Patient        HH arranged  HH-1 RN      [facility] agency  [facility]   Status of service:  Completed, signed off  Discharge Disposition:  HOME W HOME HEALTH SERVICES  Per UR Regulation:  Reviewed for med. necessity/level of care/duration of stay

## 2010-12-21 NOTE — Discharge Summary (Signed)
Ready for discharge.  Cassidy [name]. Corliss Skains, MD, [facility] Surgery  12/21/2010 10:19 AM

## 2010-12-21 NOTE — Discharge Summary (Signed)
Patient ID: Cassidy [name] MRN: [id number] DOB/AGE: 02/16/1965 45 y.o.  Admit date: 12/18/2010 Discharge date: 12/21/2010  Procedures: Incision and drainage of Right breast abscess  Consults: none  Reason for Admission: Pt with persistent left breast abscess s/p u/s guided drainage.  This continued and required admission for IV abx and surgical I&D.  Admission Diagnoses: 1. Right breast abscess  Hospital Course: The patient was admitted.  She was placed on IV zosyn.  She was then taken to the operating room for a surgical Incision and Drainage.  She tolerated this well.  Dressing changes began on POD#1.  She required admission until POD# 2 secondary to pain control.  Her wound was clean without any purulent drainage and was otherwise felt stable for d/c home.  Home health was set up and the patient will be sent home on Bactrim DS as her cultures revealed G+ cocci.  PE: Breast: wound is clean and packed.  Minimal cellulitis.  Still fairly tender.  No purulent drainage was noted.  Discharge Diagnoses:  1. Left right abscess, s/p I&D Patient Active Problem List  Diagnoses  . HERPES, GENITAL NEC  . TINEA VERSICOLOR  . DM  . HYPERCHOLESTEROLEMIA  . BIPOLAR I D/O MOST RECENT EPIS DEPRESSED MOD  . SUBSTANCE ABUSE  . DEPRESSION  . CARPAL TUNNEL SYNDROME, LEFT  . ALLERGIC RHINITIS WITH CONJUNCTIVITIS  . REACTIVE AIRWAY DISEASE  . GERD  . INTERSTITIAL CYSTITIS  . BREAST MASS, RIGHT  . PERIMENOPAUSAL STATUS  . SKIN TAG  . INGROWN HAIR  . NECK PAIN  . PLANTAR FASCIITIS  . LEG PAIN  . CHEST PAIN, NON-CARDIAC  . DYSURIA  . ABDOMINAL/PELVIC SWELLING MASS/LUMP EPIGASTRIC  . CHLAMYDIAL INFECTION, HX OF  . Abscess of right breast     Discharge Medications: Current Discharge Medication List    CONTINUE these medications which have CHANGED   Details  oxyCODONE-acetaminophen (PERCOCET) 5-325 MG per tablet Take 1-2 tablets by mouth every 4 (four) hours as needed for pain. Qty: 40  tablet, Refills: 0   Associated Diagnoses: Acute mastitis of right breast      CONTINUE these medications which have NOT CHANGED   Details  buPROPion (WELLBUTRIN SR) 150 MG 12 hr tablet Take 150 mg by mouth daily.      divalproex (DEPAKOTE ER) 500 MG 24 hr tablet Take 1,000 mg by mouth at bedtime.      exenatide (BYETTA) 5 MCG/0.02ML SOLN Inject 5 mcg into the skin 2 (two) times daily with a meal.      lisinopril (PRINIVIL,ZESTRIL) 20 MG tablet Take 20 mg by mouth daily.      metFORMIN (GLUCOPHAGE) 500 MG tablet Take 500 mg by mouth 2 (two) times daily with a meal.      naproxen (NAPROSYN) 500 MG tablet Take 500 mg by mouth 2 (two) times daily with a meal.      PARoxetine (PAXIL-CR) 37.5 MG 24 hr tablet Take 37.5 mg by mouth every morning.      sulfamethoxazole-trimethoprim (BACTRIM DS) 800-160 MG per tablet Take 1 tablet by mouth 2 (two) times daily.        STOP taking these medications     fluconazole (DIFLUCAN) 100 MG tablet         Discharge Instructions: Follow-up Information    Follow up with Emelia Loron, MD. (1-2 weeks call for appointment)    Contact information:   [facility] [address] [place] [address] [phone]  Signed: Breane Grunwald E 12/21/2010, 9:08 AM

## 2010-12-21 NOTE — Telephone Encounter (Signed)
Pt called the office today to verify that [facility] had been notified to visit her for dressing changes after her breast surgery.  She also questioned whether she needed any more antibiotics. I called [facility] and spoke with Herbert Seta, who confirmed that they had rec'd orders from Dr. Corliss Skains, and would be at the pt's home on 12/22/10.  I called to let Cassidy [name] know, and told her that we would call her tomorrow if she needed any additional antibiotics.

## 2010-12-22 ENCOUNTER — Telehealth (INDEPENDENT_AMBULATORY_CARE_PROVIDER_SITE_OTHER): Payer: Self-pay

## 2010-12-22 ENCOUNTER — Encounter (HOSPITAL_COMMUNITY): Payer: Self-pay | Admitting: General Surgery

## 2010-12-22 DIAGNOSIS — N611 Abscess of the breast and nipple: Secondary | ICD-10-CM

## 2010-12-22 LAB — GLUCOSE, CAPILLARY: Glucose-Capillary: 132 mg/dL — ABNORMAL HIGH (ref 70–99)

## 2010-12-22 MED ORDER — SULFAMETHOXAZOLE-TMP DS 800-160 MG PO TABS: 1.0000 | ORAL_TABLET | Freq: Two times a day (BID) | ORAL | Status: AC

## 2010-12-22 NOTE — Telephone Encounter (Signed)
LMOM for pt to call me so we can go over her antibiotics per Dr Dwain Sarna she should have another refill on the Bactrim/ [facility]

## 2010-12-22 NOTE — Telephone Encounter (Signed)
Pt returned my call about the antibiotics and she does need a refill on the Bactrim DS. I will e-prescribe the antibiotic to [facility] on [address] [facility]

## 2010-12-23 ENCOUNTER — Encounter (INDEPENDENT_AMBULATORY_CARE_PROVIDER_SITE_OTHER): Payer: Medicaid Other | Admitting: Surgery

## 2010-12-25 LAB — ANAEROBIC CULTURE

## 2010-12-28 LAB — CULTURE, ROUTINE-ABSCESS

## 2010-12-30 ENCOUNTER — Ambulatory Visit (INDEPENDENT_AMBULATORY_CARE_PROVIDER_SITE_OTHER): Payer: Medicaid Other | Admitting: General Surgery

## 2010-12-30 ENCOUNTER — Encounter (INDEPENDENT_AMBULATORY_CARE_PROVIDER_SITE_OTHER): Payer: Self-pay | Admitting: General Surgery

## 2010-12-30 VITALS — BP 124/76 | HR 88 | Temp 97.0°F | Resp 20 | Ht 67.0 in | Wt 246.4 lb

## 2010-12-30 DIAGNOSIS — N61 Mastitis without abscess: Secondary | ICD-10-CM

## 2010-12-30 DIAGNOSIS — Z09 Encounter for follow-up examination after completed treatment for conditions other than malignant neoplasm: Secondary | ICD-10-CM

## 2010-12-30 MED ORDER — OXYCODONE-ACETAMINOPHEN 5-325 MG PO TABS: 1.0000 | ORAL_TABLET | ORAL | Status: AC | PRN

## 2010-12-30 NOTE — Patient Instructions (Signed)
Daily dressing changes, may shower.

## 2010-12-30 NOTE — Progress Notes (Signed)
Subjective:     Patient ID: Cassidy [name], female   DOB: [birth year], 45 y.o.   MRN: [id number]  HPI This is a 45 year old female who had a chronic right breast abscess. I took her to the operating room incised and drained this. She was then sent home with dressing changes. 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 She is otherwise doing well without any complaints.  Review of Systems     Objective:   Physical Exam Right breast open wound with pink granulation tissue present, some exudate, no infection    Assessment:     Right breast abscess s/p i and d    Plan:     This is healing well and has no evidence of any infection. I did give her some more Percocet today. We discussed again control of her diabetes as well as smoking cessation. She is going to continue doing daily dressing changes. I told her she could begin to shower. I'll see her back in about 3-4 weeks.

## 2011-01-06 ENCOUNTER — Telehealth (INDEPENDENT_AMBULATORY_CARE_PROVIDER_SITE_OTHER): Payer: Self-pay | Admitting: General Surgery

## 2011-01-06 NOTE — Telephone Encounter (Signed)
[facility] called today and wanted to do more week for Cassidy [name] and I gave orders over the phone to go out 1 x a week for 4-5

## 2011-01-11 ENCOUNTER — Telehealth (INDEPENDENT_AMBULATORY_CARE_PROVIDER_SITE_OTHER): Payer: Self-pay | Admitting: General Surgery

## 2011-01-11 NOTE — Telephone Encounter (Signed)
Called pt and she is having some greenish drainage from her Rt Br and only had 2 antibodies left and spoke to Dr Magnus Ivan to refill the Bactrim and call in Dilfucan 150mg   Into [facility] [phone] and made appt for pt to come back in for a appt on 01-26-11

## 2011-01-11 NOTE — Telephone Encounter (Signed)
The patient contacted the office stating she is finished with her antibiotic, would like to know if she is needing to continue with them if so she will need a refill, rx will be complete 01/12/11

## 2011-01-15 LAB — FUNGUS CULTURE W SMEAR: Fungal Smear: NONE SEEN

## 2011-01-26 ENCOUNTER — Ambulatory Visit (INDEPENDENT_AMBULATORY_CARE_PROVIDER_SITE_OTHER): Payer: Medicaid Other | Admitting: General Surgery

## 2011-01-26 ENCOUNTER — Encounter (INDEPENDENT_AMBULATORY_CARE_PROVIDER_SITE_OTHER): Payer: Self-pay | Admitting: General Surgery

## 2011-01-26 VITALS — BP 126/78 | HR 80 | Resp 16 | Ht 67.5 in | Wt 244.0 lb

## 2011-01-26 DIAGNOSIS — Z09 Encounter for follow-up examination after completed treatment for conditions other than malignant neoplasm: Secondary | ICD-10-CM

## 2011-01-26 NOTE — Progress Notes (Signed)
Subjective:     Patient ID: Cassidy [name], female   DOB: 1965/06/04, 46 y.o.   MRN: [id number]  HPI 68 yof s/p incision and drainage of right breast abscess.  She is doing well without complaint.  This is nearly healed.  Review of Systems     Objective:   Physical Exam 3 mm open wound with granulating tissue no infection    Assessment:     S/p i and d right breast abscess    Plan:     No need for further dressing changes Follow up as needed

## 2011-04-08 ENCOUNTER — Encounter (INDEPENDENT_AMBULATORY_CARE_PROVIDER_SITE_OTHER): Payer: Medicaid Other | Admitting: Surgery

## 2011-04-08 ENCOUNTER — Encounter (INDEPENDENT_AMBULATORY_CARE_PROVIDER_SITE_OTHER): Payer: Self-pay | Admitting: General Surgery

## 2011-04-08 ENCOUNTER — Ambulatory Visit (INDEPENDENT_AMBULATORY_CARE_PROVIDER_SITE_OTHER): Payer: Medicaid Other | Admitting: General Surgery

## 2011-04-08 ENCOUNTER — Telehealth (INDEPENDENT_AMBULATORY_CARE_PROVIDER_SITE_OTHER): Payer: Self-pay | Admitting: General Surgery

## 2011-04-08 VITALS — BP 142/88 | HR 80 | Temp 97.0°F | Resp 16 | Ht 67.5 in | Wt 244.8 lb

## 2011-04-08 DIAGNOSIS — N61 Mastitis without abscess: Secondary | ICD-10-CM

## 2011-04-08 MED ORDER — DOXYCYCLINE HYCLATE 100 MG PO TABS: 100.0000 mg | ORAL_TABLET | Freq: Two times a day (BID) | ORAL | Status: AC

## 2011-04-08 MED ORDER — FLUCONAZOLE 100 MG PO TABS: 150.0000 mg | ORAL_TABLET | Freq: Once | ORAL | Status: AC

## 2011-04-08 NOTE — Progress Notes (Signed)
Subjective:     Patient ID: Cassidy [name], female   DOB: 1965-03-08, 46 y.o.   MRN: [id number]  HPI 13 yof with history of diabetes, smoker with prior right breast abscess that I met on call in December.  Had a large right breast abscess then that I did incision and drainage in the or.  She did well and this healed by secondary intention.  Over past 24 hours, she has had recurrent mass near the prior incision underneath her nipple.  No fevers, this is tender, no nipple discharge.  Review of Systems     Objective:   Physical Exam  Vitals reviewed. Pulmonary/Chest:         Assessment:     History right breast abscess, ? Recurrence vs old scar in cavity    Plan:     Will treat with po abx and warm compresses We discussed again control of glucose and smoking cessation Hopefully this will get better or she may need more surgery I will see again in 2 weeks unless this worsens

## 2011-04-12 NOTE — Telephone Encounter (Signed)
Appt made for 04/21/11 @ 1:50pm.

## 2011-04-15 ENCOUNTER — Telehealth (INDEPENDENT_AMBULATORY_CARE_PROVIDER_SITE_OTHER): Payer: Self-pay | Admitting: General Surgery

## 2011-04-15 NOTE — Telephone Encounter (Signed)
Pharmacist calling to clarify Rx written by Dr. Dwain Sarna.  Clarification provided, per office notes.

## 2011-04-21 ENCOUNTER — Encounter (INDEPENDENT_AMBULATORY_CARE_PROVIDER_SITE_OTHER): Payer: Self-pay | Admitting: General Surgery

## 2011-04-21 ENCOUNTER — Ambulatory Visit (INDEPENDENT_AMBULATORY_CARE_PROVIDER_SITE_OTHER): Payer: Medicaid Other | Admitting: General Surgery

## 2011-04-21 VITALS — BP 136/76 | HR 96 | Temp 97.4°F | Resp 20 | Ht 67.5 in | Wt 244.0 lb

## 2011-04-21 DIAGNOSIS — N61 Mastitis without abscess: Secondary | ICD-10-CM

## 2011-04-21 DIAGNOSIS — N611 Abscess of the breast and nipple: Secondary | ICD-10-CM

## 2011-04-21 NOTE — Progress Notes (Signed)
Subjective:     Patient ID: Cassidy [name], female   DOB: Mar 28, 1965, 46 y.o.   MRN: [id number]  HPI 64 yof with history of diabetes, smoker with prior right breast abscess that I met on call in December. Had a large right breast abscess then that I did incision and drainage in the or. She did well and this healed by secondary intention. She has recurrent mass that I do not think is infected and I think this is residual abscess cavity.  This is somewhat better than 2 weeks ago but still present and mildly tender.  Has been taking abx. No drainage   Review of Systems     Objective:   Physical Exam Somewhat smaller 1.5 cm mildly tender area around prior right breast incision    Assessment:     Residual abscess    Plan:     I think this will hopefully resolve with conservative therapy.  Will cont abx, compresses. I will have return in 2 weeks, may repeat u/s at that point also.I do think clinically this has been an abscess

## 2011-04-26 ENCOUNTER — Ambulatory Visit: Admit: 2011-04-26 | Payer: Self-pay | Admitting: General Surgery

## 2011-04-26 ENCOUNTER — Encounter (HOSPITAL_COMMUNITY): Payer: Self-pay | Admitting: *Deleted

## 2011-04-26 ENCOUNTER — Inpatient Hospital Stay (HOSPITAL_COMMUNITY)
Admission: AD | Admit: 2011-04-26 | Discharge: 2011-04-27 | DRG: 600 | Disposition: A | Discharge status: Home Health | Payer: Medicaid Other | Source: Ambulatory Visit | Attending: General Surgery | Admitting: General Surgery

## 2011-04-26 ENCOUNTER — Ambulatory Visit (INDEPENDENT_AMBULATORY_CARE_PROVIDER_SITE_OTHER): Payer: Medicaid Other | Admitting: Surgery

## 2011-04-26 ENCOUNTER — Inpatient Hospital Stay (HOSPITAL_COMMUNITY): Payer: Medicaid Other | Admitting: *Deleted

## 2011-04-26 ENCOUNTER — Inpatient Hospital Stay (HOSPITAL_COMMUNITY): Payer: Medicaid Other

## 2011-04-26 ENCOUNTER — Encounter (HOSPITAL_COMMUNITY): Admission: AD | Disposition: A | Discharge status: Home Health | Payer: Self-pay | Source: Ambulatory Visit

## 2011-04-26 ENCOUNTER — Encounter (INDEPENDENT_AMBULATORY_CARE_PROVIDER_SITE_OTHER): Payer: Self-pay | Admitting: Surgery

## 2011-04-26 VITALS — BP 144/96 | HR 72 | Temp 97.9°F | Resp 20 | Ht 67.5 in | Wt 247.6 lb

## 2011-04-26 DIAGNOSIS — N611 Abscess of the breast and nipple: Secondary | ICD-10-CM

## 2011-04-26 DIAGNOSIS — F17200 Nicotine dependence, unspecified, uncomplicated: Secondary | ICD-10-CM | POA: Diagnosis present

## 2011-04-26 DIAGNOSIS — F1910 Other psychoactive substance abuse, uncomplicated: Secondary | ICD-10-CM | POA: Diagnosis present

## 2011-04-26 DIAGNOSIS — F3130 Bipolar disorder, current episode depressed, mild or moderate severity, unspecified: Secondary | ICD-10-CM | POA: Diagnosis present

## 2011-04-26 DIAGNOSIS — Z6838 Body mass index (BMI) 38.0-38.9, adult: Secondary | ICD-10-CM

## 2011-04-26 DIAGNOSIS — A6000 Herpesviral infection of urogenital system, unspecified: Secondary | ICD-10-CM | POA: Diagnosis present

## 2011-04-26 DIAGNOSIS — E119 Type 2 diabetes mellitus without complications: Secondary | ICD-10-CM | POA: Diagnosis present

## 2011-04-26 DIAGNOSIS — Z01818 Encounter for other preprocedural examination: Secondary | ICD-10-CM

## 2011-04-26 DIAGNOSIS — E7800 Pure hypercholesterolemia, unspecified: Secondary | ICD-10-CM | POA: Diagnosis present

## 2011-04-26 DIAGNOSIS — N61 Mastitis without abscess: Principal | ICD-10-CM | POA: Diagnosis present

## 2011-04-26 DIAGNOSIS — J45909 Unspecified asthma, uncomplicated: Secondary | ICD-10-CM | POA: Diagnosis present

## 2011-04-26 DIAGNOSIS — I10 Essential (primary) hypertension: Secondary | ICD-10-CM | POA: Diagnosis present

## 2011-04-26 DIAGNOSIS — K219 Gastro-esophageal reflux disease without esophagitis: Secondary | ICD-10-CM | POA: Diagnosis present

## 2011-04-26 SURGERY — INCISION AND DRAINAGE, ABSCESS
Anesthesia: General | Site: Breast | Laterality: Right | Wound class: Clean Contaminated

## 2011-04-26 MED ORDER — DEXTROSE IN LACTATED RINGERS 5 % IV SOLN
INTRAVENOUS | Status: DC
  Administered 2011-04-26: 19:00:00 via INTRAVENOUS

## 2011-04-26 MED ORDER — WHITE PETROLATUM GEL
Status: AC
  Filled 2011-04-26: qty 5

## 2011-04-26 MED ORDER — DARIFENACIN HYDROBROMIDE ER 15 MG PO TB24
15.0000 mg | ORAL_TABLET | Freq: Every day | ORAL | Status: DC
  Administered 2011-04-27: 15 mg via ORAL
  Filled 2011-04-26: qty 1

## 2011-04-26 MED ORDER — DIPHENHYDRAMINE HCL 50 MG/ML IJ SOLN
12.5000 mg | Freq: Four times a day (QID) | INTRAMUSCULAR | Status: DC | PRN
  Administered 2011-04-26: 25 mg via INTRAVENOUS
  Filled 2011-04-26: qty 1

## 2011-04-26 MED ORDER — ACETAMINOPHEN 650 MG RE SUPP: 650.0000 mg | Freq: Four times a day (QID) | RECTAL | Status: DC | PRN

## 2011-04-26 MED ORDER — DIVALPROEX SODIUM ER 500 MG PO TB24
500.0000 mg | ORAL_TABLET | Freq: Every day | ORAL | Status: DC
  Administered 2011-04-27: 500 mg via ORAL
  Filled 2011-04-26 (×2): qty 1

## 2011-04-26 MED ORDER — MAGIC MOUTHWASH
15.0000 mL | Freq: Four times a day (QID) | ORAL | Status: DC | PRN
  Administered 2011-04-27: 5 mL via ORAL
  Filled 2011-04-26 (×3): qty 15

## 2011-04-26 MED ORDER — LIP MEDEX EX OINT
1.0000 "application " | TOPICAL_OINTMENT | Freq: Two times a day (BID) | CUTANEOUS | Status: DC
  Administered 2011-04-27: 1 via TOPICAL
  Filled 2011-04-26: qty 7

## 2011-04-26 MED ORDER — NEOSTIGMINE METHYLSULFATE 1 MG/ML IJ SOLN
INTRAMUSCULAR | Status: DC | PRN
  Administered 2011-04-26: 1 mg via INTRAVENOUS
  Administered 2011-04-26: 4 mg via INTRAVENOUS

## 2011-04-26 MED ORDER — HYDROMORPHONE HCL PF 1 MG/ML IJ SOLN
0.5000 mg | INTRAMUSCULAR | Status: DC | PRN
  Administered 2011-04-26: 1 mg via INTRAVENOUS
  Administered 2011-04-26: 2 mg via INTRAVENOUS
  Administered 2011-04-27: 1 mg via INTRAVENOUS
  Administered 2011-04-27: 2 mg via INTRAVENOUS
  Administered 2011-04-27: 1 mg via INTRAVENOUS
  Filled 2011-04-26 (×3): qty 1
  Filled 2011-04-26 (×2): qty 2

## 2011-04-26 MED ORDER — PAROXETINE HCL ER 37.5 MG PO TB24
37.5000 mg | ORAL_TABLET | Freq: Every day | ORAL | Status: DC
  Administered 2011-04-27: 37.5 mg via ORAL
  Filled 2011-04-26: qty 1

## 2011-04-26 MED ORDER — ONDANSETRON HCL 4 MG/2ML IJ SOLN: 4.0000 mg | Freq: Four times a day (QID) | INTRAMUSCULAR | Status: DC | PRN

## 2011-04-26 MED ORDER — VANCOMYCIN HCL 1000 MG IV SOLR
2000.0000 mg | INTRAVENOUS | Status: DC | PRN
  Administered 2011-04-26: 2000 mg via INTRAVENOUS

## 2011-04-26 MED ORDER — ONDANSETRON HCL 4 MG/2ML IJ SOLN
4.0000 mg | Freq: Four times a day (QID) | INTRAMUSCULAR | Status: DC | PRN
  Administered 2011-04-27: 4 mg via INTRAVENOUS
  Filled 2011-04-26: qty 2

## 2011-04-26 MED ORDER — ALBUTEROL SULFATE (2.5 MG/3ML) 0.083% IN NEBU
INHALATION_SOLUTION | RESPIRATORY_TRACT | Status: DC | PRN
  Administered 2011-04-26 (×2): 1 mg via RESPIRATORY_TRACT

## 2011-04-26 MED ORDER — VANCOMYCIN HCL 1000 MG IV SOLR
2000.0000 mg | Freq: Once | INTRAVENOUS | Status: DC
  Filled 2011-04-26: qty 2000

## 2011-04-26 MED ORDER — POLYVINYL ALCOHOL 1.4 % OP SOLN
2.0000 [drp] | OPHTHALMIC | Status: DC | PRN
  Filled 2011-04-26: qty 15

## 2011-04-26 MED ORDER — CHLORHEXIDINE GLUCONATE 4 % EX LIQD
1.0000 "application " | Freq: Once | CUTANEOUS | Status: DC
  Filled 2011-04-26: qty 15

## 2011-04-26 MED ORDER — MAGNESIUM HYDROXIDE 400 MG/5ML PO SUSP: 30.0000 mL | Freq: Three times a day (TID) | ORAL | Status: DC | PRN

## 2011-04-26 MED ORDER — LACTATED RINGERS IV SOLN
INTRAVENOUS | Status: DC | PRN
  Administered 2011-04-26: 20:00:00 via INTRAVENOUS

## 2011-04-26 MED ORDER — POLYETHYL GLYCOL-PROPYL GLYCOL 0.4-0.3 % OP SOLN: 2.0000 [drp] | Freq: Every day | OPHTHALMIC | Status: DC | PRN

## 2011-04-26 MED ORDER — BUPROPION HCL ER (SR) 150 MG PO TB12
150.0000 mg | ORAL_TABLET | Freq: Every day | ORAL | Status: DC
  Administered 2011-04-27: 150 mg via ORAL
  Filled 2011-04-26: qty 1

## 2011-04-26 MED ORDER — MIDAZOLAM HCL 5 MG/5ML IJ SOLN
INTRAMUSCULAR | Status: DC | PRN
  Administered 2011-04-26: 2 mg via INTRAVENOUS

## 2011-04-26 MED ORDER — GLYCOPYRROLATE 0.2 MG/ML IJ SOLN
INTRAMUSCULAR | Status: DC | PRN
  Administered 2011-04-26: .2 mg via INTRAVENOUS
  Administered 2011-04-26: .7 mg via INTRAVENOUS

## 2011-04-26 MED ORDER — NAPROXEN 500 MG PO TABS
500.0000 mg | ORAL_TABLET | Freq: Two times a day (BID) | ORAL | Status: DC
  Administered 2011-04-27: 500 mg via ORAL
  Filled 2011-04-26 (×5): qty 1

## 2011-04-26 MED ORDER — METFORMIN HCL 500 MG PO TABS
500.0000 mg | ORAL_TABLET | Freq: Two times a day (BID) | ORAL | Status: DC
  Administered 2011-04-27: 500 mg via ORAL
  Filled 2011-04-26 (×3): qty 1

## 2011-04-26 MED ORDER — METOPROLOL TARTRATE 12.5 MG HALF TABLET
12.5000 mg | ORAL_TABLET | Freq: Two times a day (BID) | ORAL | Status: DC | PRN
  Filled 2011-04-26: qty 1

## 2011-04-26 MED ORDER — LISINOPRIL 10 MG PO TABS
10.0000 mg | ORAL_TABLET | Freq: Every day | ORAL | Status: DC
  Administered 2011-04-27: 10 mg via ORAL
  Filled 2011-04-26: qty 1

## 2011-04-26 MED ORDER — ACETAMINOPHEN 325 MG PO TABS
650.0000 mg | ORAL_TABLET | Freq: Four times a day (QID) | ORAL | Status: DC | PRN
  Administered 2011-04-27: 650 mg via ORAL
  Filled 2011-04-26: qty 2

## 2011-04-26 MED ORDER — SODIUM CHLORIDE 0.9 % IV SOLN
INTRAVENOUS | Status: DC
  Administered 2011-04-26: 23:00:00 via INTRAVENOUS

## 2011-04-26 MED ORDER — OXYCODONE HCL 5 MG PO TABS
5.0000 mg | ORAL_TABLET | ORAL | Status: DC | PRN
  Administered 2011-04-27: 10 mg via ORAL
  Filled 2011-04-26: qty 2

## 2011-04-26 MED ORDER — FENTANYL CITRATE 0.05 MG/ML IJ SOLN
INTRAMUSCULAR | Status: DC | PRN
  Administered 2011-04-26: 100 ug via INTRAVENOUS
  Administered 2011-04-26 (×3): 50 ug via INTRAVENOUS

## 2011-04-26 MED ORDER — FLUCONAZOLE 150 MG PO TABS
150.0000 mg | ORAL_TABLET | Freq: Once | ORAL | Status: AC
  Administered 2011-04-27: 150 mg via ORAL
  Filled 2011-04-26: qty 1

## 2011-04-26 MED ORDER — ROCURONIUM BROMIDE 100 MG/10ML IV SOLN
INTRAVENOUS | Status: DC | PRN
  Administered 2011-04-26: 30 mg via INTRAVENOUS

## 2011-04-26 MED ORDER — PROPOFOL 10 MG/ML IV EMUL
INTRAVENOUS | Status: DC | PRN
  Administered 2011-04-26: 170 mg via INTRAVENOUS

## 2011-04-26 MED ORDER — VANCOMYCIN HCL IN DEXTROSE 1-5 GM/200ML-% IV SOLN
1000.0000 mg | Freq: Two times a day (BID) | INTRAVENOUS | Status: DC
  Administered 2011-04-27: 1000 mg via INTRAVENOUS
  Filled 2011-04-26 (×2): qty 200

## 2011-04-26 MED ORDER — HYDROMORPHONE HCL PF 1 MG/ML IJ SOLN: 0.2500 mg | INTRAMUSCULAR | Status: DC | PRN

## 2011-04-26 MED ORDER — EXENATIDE 5 MCG/0.02ML ~~LOC~~ SOPN
5.0000 ug | PEN_INJECTOR | Freq: Two times a day (BID) | SUBCUTANEOUS | Status: DC
  Administered 2011-04-27: 5 ug via SUBCUTANEOUS
  Filled 2011-04-26: qty 0.02

## 2011-04-26 MED ORDER — ONDANSETRON HCL 4 MG/2ML IJ SOLN
INTRAMUSCULAR | Status: DC | PRN
  Administered 2011-04-26: 4 mg via INTRAVENOUS

## 2011-04-26 MED ORDER — PIPERACILLIN-TAZOBACTAM 3.375 G IVPB
3.3750 g | Freq: Three times a day (TID) | INTRAVENOUS | Status: DC
  Administered 2011-04-26 – 2011-04-27 (×3): 3.375 g via INTRAVENOUS
  Filled 2011-04-26 (×5): qty 50

## 2011-04-26 SURGICAL SUPPLY — 45 items
ADH SKN CLS APL DERMABOND .7 (GAUZE/BANDAGES/DRESSINGS) ×2
BINDER BREAST LRG (GAUZE/BANDAGES/DRESSINGS) IMPLANT
BINDER BREAST XLRG (GAUZE/BANDAGES/DRESSINGS) IMPLANT
BLADE SURG 15 STRL LF DISP TIS (BLADE) ×2 IMPLANT
BLADE SURG 15 STRL SS (BLADE) ×3
CANISTER SUCTION 2500CC (MISCELLANEOUS) IMPLANT
CHLORAPREP W/TINT 26ML (MISCELLANEOUS) ×3 IMPLANT
CLEANER TIP ELECTROSURG 2X2 (MISCELLANEOUS) ×3 IMPLANT
CLOTH BEACON ORANGE TIMEOUT ST (SAFETY) ×3 IMPLANT
CONT SPEC 4OZ CLIKSEAL STRL BL (MISCELLANEOUS) IMPLANT
COVER SURGICAL LIGHT HANDLE (MISCELLANEOUS) ×3 IMPLANT
DECANTER SPIKE VIAL GLASS SM (MISCELLANEOUS) ×3 IMPLANT
DERMABOND ADVANCED (GAUZE/BANDAGES/DRESSINGS) ×1
DERMABOND ADVANCED .7 DNX12 (GAUZE/BANDAGES/DRESSINGS) ×2 IMPLANT
DRAPE PED LAPAROTOMY (DRAPES) ×3 IMPLANT
DRAPE UTILITY 15X26 W/TAPE STR (DRAPE) ×6 IMPLANT
DRSG TEGADERM 4X4.75 (GAUZE/BANDAGES/DRESSINGS) IMPLANT
ELECT REM PT RETURN 9FT ADLT (ELECTROSURGICAL) ×3
ELECTRODE REM PT RTRN 9FT ADLT (ELECTROSURGICAL) ×2 IMPLANT
GAUZE PACKING IODOFORM 1/2 (PACKING) ×4 IMPLANT
GAUZE SPONGE 4X4 16PLY XRAY LF (GAUZE/BANDAGES/DRESSINGS) ×3 IMPLANT
GLOVE BIOGEL PI IND STRL 8 (GLOVE) ×2 IMPLANT
GLOVE BIOGEL PI INDICATOR 8 (GLOVE) ×1
GLOVE ECLIPSE 7.5 STRL STRAW (GLOVE) ×3 IMPLANT
GOWN STRL NON-REIN LRG LVL3 (GOWN DISPOSABLE) ×6 IMPLANT
KIT BASIN OR (CUSTOM PROCEDURE TRAY) ×3 IMPLANT
KIT ROOM TURNOVER OR (KITS) ×3 IMPLANT
NDL HYPO 25GX1X1/2 BEV (NEEDLE) ×1 IMPLANT
NEEDLE HYPO 25GX1X1/2 BEV (NEEDLE) ×3 IMPLANT
NS IRRIG 1000ML POUR BTL (IV SOLUTION) ×3 IMPLANT
PACK SURGICAL SETUP 50X90 (CUSTOM PROCEDURE TRAY) ×3 IMPLANT
PAD ARMBOARD 7.5X6 YLW CONV (MISCELLANEOUS) ×6 IMPLANT
PENCIL BUTTON HOLSTER BLD 10FT (ELECTRODE) ×3 IMPLANT
SPONGE GAUZE 4X4 12PLY (GAUZE/BANDAGES/DRESSINGS) ×2 IMPLANT
STRIP CLOSURE SKIN 1/4X4 (GAUZE/BANDAGES/DRESSINGS) ×3 IMPLANT
SUT VIC AB 4-0 SH 27 (SUTURE) ×3
SUT VIC AB 4-0 SH 27XBRD (SUTURE) ×2 IMPLANT
SUT VIC AB 5-0 P-3 18XBRD (SUTURE) ×2 IMPLANT
SUT VIC AB 5-0 P3 18 (SUTURE) ×3
SYR CONTROL 10ML LL (SYRINGE) ×3 IMPLANT
TOWEL OR 17X24 6PK STRL BLUE (TOWEL DISPOSABLE) ×3 IMPLANT
TOWEL OR 17X26 10 PK STRL BLUE (TOWEL DISPOSABLE) ×3 IMPLANT
TUBE CONNECTING 12X1/4 (SUCTIONS) IMPLANT
WATER STERILE IRR 1000ML POUR (IV SOLUTION) IMPLANT
YANKAUER SUCT BULB TIP NO VENT (SUCTIONS) IMPLANT

## 2011-04-26 NOTE — Anesthesia Preprocedure Evaluation (Signed)
Anesthesia Evaluation  Patient identified by MRN, date of birth, ID band Patient awake    Reviewed: Allergy & Precautions, H&P , NPO status , Patient's Chart, lab work & pertinent test results  Airway Mallampati: II  Neck ROM: full    Dental   Pulmonary asthma , Current Smoker,          Cardiovascular hypertension,     Neuro/Psych Depression  Neuromuscular disease    GI/Hepatic GERD-  ,  Endo/Other  Diabetes mellitus-Morbid obesity  Renal/GU      Musculoskeletal   Abdominal   Peds  Hematology   Anesthesia Other Findings   Reproductive/Obstetrics                           Anesthesia Physical Anesthesia Plan  ASA: III  Anesthesia Plan: General   Post-op Pain Management:    Induction: Intravenous  Airway Management Planned: Oral ETT  Additional Equipment:   Intra-op Plan:   Post-operative Plan: Extubation in OR  Informed Consent: I have reviewed the patients History and Physical, chart, labs and discussed the procedure including the risks, benefits and alternatives for the proposed anesthesia with the patient or authorized representative who has indicated his/her understanding and acceptance.     Plan Discussed with: CRNA and Surgeon  Anesthesia Plan Comments:         Anesthesia Quick Evaluation

## 2011-04-26 NOTE — Transfer of Care (Signed)
Immediate Anesthesia Transfer of Care Note  Patient: Cassidy [name]  Procedure(s) Performed: Procedure(s) (LRB): INCISION AND DRAINAGE ABSCESS (Right)  Patient Location: PACU  Anesthesia Type: General  Level of Consciousness: awake, oriented and patient cooperative  Airway & Oxygen Therapy: Patient Spontanous Breathing and Patient connected to face mask oxygen  Post-op Assessment: Report given to PACU RN, Post -op Vital signs reviewed and stable and Patient moving all extremities X 4  Post vital signs: Reviewed and stable  Complications: No apparent anesthesia complications

## 2011-04-26 NOTE — H&P (Signed)
SISTER CARBONE  [birth year] [id number]  Patient Care Team: Yisroel Ramming, MD as PCP - General (Infectious Diseases)  This patient is a 46 y.o.female who presents today for surgical evaluation.   Reason for visit: Recurrent right breast abscess.  Patient is an obese pleasant female. She is a smoker. She is diabetic. She had a abscess drained by Dr. Adrian Blackwater in December 2012. She gradually healed with packing & secondary intention. She came to the office last week for followup.   All that remained was a 2 cm close area of firmness consistent with healing.  She noted a few days ago that the breast it became very tender and swollen. It is it is exquisitely painful. She feels it is worse than when it was in December. She wished to be seen today.  No sick contacts or travel history no falls. She has been trying to back off on her smoking. Glucoses she thinks are running okay.  Patient Active Problem List  Diagnoses  . HERPES, GENITAL NEC  . TINEA VERSICOLOR  . DM  . HYPERCHOLESTEROLEMIA  . BIPOLAR I D/O MOST RECENT EPIS DEPRESSED MOD  . SUBSTANCE ABUSE  . DEPRESSION  . CARPAL TUNNEL SYNDROME, LEFT  . ALLERGIC RHINITIS WITH CONJUNCTIVITIS  . REACTIVE AIRWAY DISEASE  . GERD  . INTERSTITIAL CYSTITIS  . BREAST MASS, RIGHT  . PERIMENOPAUSAL STATUS  . SKIN TAG  . INGROWN HAIR  . NECK PAIN  . PLANTAR FASCIITIS  . LEG PAIN  . CHEST PAIN, NON-CARDIAC  . DYSURIA  . ABDOMINAL/PELVIC SWELLING MASS/LUMP EPIGASTRIC  . CHLAMYDIAL INFECTION, HX OF  . Abscess of right breast    Past Medical History  Diagnosis Date  . Hypertension   . Depression   . GERD (gastroesophageal reflux disease)   . Breast abscess     right  . Diabetes mellitus     type 2    Past Surgical History  Procedure Date  . Bladder surgery 1980  . Irrigation and debridement abscess 12/19/2010    Procedure: IRRIGATION AND DEBRIDEMENT ABSCESS;  Surgeon: Emelia Loron, MD;  Location: [facility] OR;  Service: General;   Laterality: Right;    History   Social History  . Marital Status: Single    Spouse Name: N/A    Number of Children: N/A  . Years of Education: N/A   Occupational History  . Not on file.   Social History Main Topics  . Smoking status: Current Everyday Smoker -- 0.3 packs/day    Types: Cigarettes  . Smokeless tobacco: Never Used  . Alcohol Use: No  . Drug Use: No  . Sexually Active: Not on file   Other Topics Concern  . Not on file   Social History Narrative  . No narrative on file    Family History  Problem Relation Age of Onset  . COPD Mother   . Cancer Mother     lymphoma  . Cancer Maternal Aunt     breast, colon    Current Outpatient Prescriptions  Medication Sig Dispense Refill  . buPROPion (WELLBUTRIN SR) 150 MG 12 hr tablet Take 150 mg by mouth daily.        . divalproex (DEPAKOTE ER) 500 MG 24 hr tablet Take 1,000 mg by mouth at bedtime.        Marland Kitchen doxycycline (ADOXA) 100 MG tablet Take 100 mg by mouth 2 (two) times daily.      Marland Kitchen exenatide (BYETTA) 5 MCG/0.02ML SOLN Inject 5 mcg into the skin  2 (two) times daily with a meal.        . lisinopril (PRINIVIL,ZESTRIL) 20 MG tablet Take 20 mg by mouth daily.        . metFORMIN (GLUCOPHAGE) 500 MG tablet Take 500 mg by mouth 2 (two) times daily with a meal.        . naproxen (NAPROSYN) 500 MG tablet Take 500 mg by mouth 2 (two) times daily with a meal.        . PARoxetine (PAXIL-CR) 37.5 MG 24 hr tablet Take 37.5 mg by mouth every morning.           No Known Allergies  ROS: Constitutional:  No fevers, chills, sweats.  Weight stable Eyes:  No vision changes, No discharge HENT:  No sore throats, nasal drainage Lymph: No neck swelling, No bruising easily Pulmonary:  No cough, productive sputum CV: No orthopnea, PND  Patient walks 20 minutes for about 1/2 mile without difficulty.  No exertional chest/neck/shoulder/arm pain.  GI: No personal nor family history of GI/colon cancer, inflammatory bowel disease,  irritable bowel syndrome, allergy such as Celiac Sprue, dietary/dairy problems, colitis, ulcers nor gastritis.    No recent sick contacts/gastroenteritis.  No travel outside the country.  No changes in diet.  Renal: No UTIs, No hematuria Genital:  No drainage, bleeding, masses Musculoskeletal: No severe joint pain.  Good ROM major joints Skin:  No sores or lesions.  No rashes Heme/Lymph:  No easy bleeding.  No swollen lymph nodes  BP 144/96  Pulse 72  Temp(Src) 97.9 F (36.6 C) (Temporal)  Resp 20  Ht 5' 7.5" (1.715 m)  Wt 247 lb 9.6 oz (112.311 kg)  BMI 38.21 kg/m2  Physical Exam: General: Pt awake/alert/oriented x4 in mild distress Eyes: PERRL, normal EOM. Sclera nonicteric Neuro: CN II-XII intact w/o focal sensory/motor deficits. Lymph: No head/neck/groin lymphadenopathy Psych:  No delerium/psychosis/paranoia.  Tearful/frustrated but pleasant & consolable   HENT: Normocephalic, Mucus membranes moist.  No thrush Neck: Supple, No tracheal deviation Chest: No pain.  Good respiratory excursion. CV:  Pulses intact.  Regular rhythm Abdomen: Soft, Nondistended.  Nontender.  No incarcerated hernias. Ext:  SCDs BLE.  No significant edema.  No cyanosis Skin: No petechiae / purpurea Breasts:  Chest bilateral ptotic breasts of moderate size. The left is soft and flat. On the right, he is obviously more swollen. 7 x 7 cm area of erythema and exquisite tenderness. Within the breast mound itself. No nipple retraction. No nipple discharge. Well-healed scar in upper outer quadrant of the areolar line.  Results:   Labs: No results found for this or any previous visit (from the past 48 hour(s)).  Imaging / Studies: No results found.  Medications / Allergies: per chart  Antibiotics: Anti-infectives    None     Prior CX:  ABSCESS RIGHT BREAST  Special Requests NONE  Gram Stain MODERATE WBC PRESENT, PREDOMINANTLY PMN RARE SQUAMOUS EPITHELIAL CELLS PRESENT MODERATE GRAM POSITIVE  COCCI IN PAIRS IN CLUSTERS  Culture FEW PEPTOSTREPTOCOCCUS SPECIES Report Status 12/28/2010 FINAL   Assessment  Cassidy [name]  46 y.o. female    Problem List:  Active Problems:  * No active hospital problems. *   Recurrent abscess of right breast. Very tender.  Plan:  -Admit -IV Abx.  Start broadly -I&D of the abscess  The pathophysiology of subcutaneous abscess and differential diagnosis was discussed.  Natural history progression was discussed.  The patient's symptoms are not adequately controlled.  Non-operative treatment has not healed the  abscess.  Therefore, I recommended incision & drainage of the abscess to allow the infection to resolve and heal.  Technique, risks, benefits, alternatives discussed.  The patient expressed understanding & wished to proceed.  -DM control - check HgbA1C -VTE prophylaxis- SCDs, etc -mobilize as tolerated to help recovery  Ardeth Sportsman, M.D., F.A.C.S. Gastrointestinal and Minimally Invasive Surgery [facility] [address], [address] [place], [place] [address] [phone] Main / Paging [phone] Voice Mail   04/26/2011

## 2011-04-26 NOTE — Preoperative (Signed)
Beta Blockers   Reason not to administer Beta Blockers:Not Applicable 

## 2011-04-26 NOTE — Progress Notes (Signed)
The patient has a recurrent right breast cellulitis/abscess. She requires admission and IV antibiotics. She will require incision and drainage. Please see my history and physical from today

## 2011-04-26 NOTE — Op Note (Signed)
OPERATIVE REPORT  DATE OF OPERATION: 04/26/2011  PATIENT:  Cassidy [name]  46 y.o. female  PRE-OPERATIVE DIAGNOSIS:  Right breast abscess  POST-OPERATIVE DIAGNOSIS:  same  PROCEDURE:  Procedure(s): INCISION AND DRAINAGE ABSCESS OF THE RIGHT BREAST   SURGEON:  Surgeon(s): Cherylynn Ridges, MD  ASSISTANT: None  ANESTHESIA:   general  EBL: < 30 ml  BLOOD ADMINISTERED: none  DRAINS: none and Packing of two bottles of 1/2 inch iodoform Nugauze   SPECIMEN:  Source of Specimen:  pus for micro and breast tissue for biopsy and Biopsy / Limited Resection  COUNTS CORRECT:  YES  PROCEDURE DETAILS: The patient was taken to the operating room and placed on table in supine position. After an adequate endotracheal anesthetic was administered she was prepped and draped in usual sterile manner exposing her right breast.  After proper time out was performed identifying the patient and the procedure to be performed an elliptical right upper-outer quadrant right periareolar incision was made using #15 blade. Immediately there was drainage of copious amounts of greenish yellow mucopurulent fluid. Aerobic and anaerobic cultures were sent. We opened up the cavity widely with a Kelly clamp. We excised piece of breast tissue to send a separate specimen. We subsequently packed with 2 full bottles of half a gyriform Nu Gauze. A sterile dressing was applied all needle counts sponge counts and instrument counts were correct  PATIENT DISPOSITION:  PACU - hemodynamically stable.   Nivia Gervase III,Brees Hounshell O 4/15/20139:25 PM

## 2011-04-26 NOTE — Progress Notes (Signed)
ANTIBIOTIC CONSULT NOTE - INITIAL  Pharmacy Consult for Vancomycin and zosyn Indication: Recurrrent breast abscess  No Known Allergies  Patient Measurements: Height: 5' 7.5" (171.5 cm) Weight: 247 lb 9.6 oz (112.311 kg) IBW/kg (Calculated) : 62.75   Vital Signs: Temp: 98.8 F (37.1 C) (04/15 1832) Temp src: Oral (04/15 1832) BP: 133/77 mmHg (04/15 1832) Pulse Rate: 81  (04/15 1832) Intake/Output from previous day:   Intake/Output from this shift:    Labs: No results found for this basename: WBC:3,HGB:3,PLT:3,LABCREA:3,CREATININE:3 in the last 72 hours Estimated Creatinine Clearance: 113.4 ml/min (by C-G formula based on Cr of 0.77). No results found for this basename: VANCOTROUGH:2,VANCOPEAK:2,VANCORANDOM:2,GENTTROUGH:2,GENTPEAK:2,GENTRANDOM:2,TOBRATROUGH:2,TOBRAPEAK:2,TOBRARND:2,AMIKACINPEAK:2,AMIKACINTROU:2,AMIKACIN:2, in the last 72 hours   Microbiology: No results found for this or any previous visit (from the past 720 hour(s)).  Medical History: Past Medical History  Diagnosis Date  . Hypertension   . Depression   . GERD (gastroesophageal reflux disease)   . Breast abscess     right  . Diabetes mellitus     type 2    Medications:  Scheduled:    . chlorhexidine  1 application Topical Once  . chlorhexidine  1 application Topical Once  . lip balm  1 application Topical BID  . naproxen  500 mg Oral BID WC   Assessment: Cassidy [name] with recurrent abscess of right breast, admitted for I&D. Pharmacy is consulted to start vancomycin and zosyn for empiric treatment. Pt is afebrile, cbc and BMET pending for tomorrow AM. Previous scr = 0.77 on December 2012  Goal of Therapy:  Vancomycin trough level 10-15 mcg/ml  Plan:  - Vancomycin load 2g IV x 1, then 1g IV Q 12hrs - zosyn 3.375 g IV Q8hrs - f/u AM labs, adjust dose base on renal function if needed - f/u length of treatment after surgery   Bayard Hugger, PharmD, BCPS, [phone] 04/26/2011,6:40 PM

## 2011-04-26 NOTE — Anesthesia Postprocedure Evaluation (Signed)
Anesthesia Post Note  Patient: Cassidy [name]  Procedure(s) Performed: Procedure(s) (LRB): INCISION AND DRAINAGE ABSCESS (Right)  Anesthesia type: General  Patient location: PACU  Post pain: Pain level controlled and Adequate analgesia  Post assessment: Post-op Vital signs reviewed, Patient's Cardiovascular Status Stable, Respiratory Function Stable, Patent Airway and Pain level controlled  Last Vitals:  Filed Vitals:   04/26/11 2215  BP: 124/73  Pulse: 74  Temp: 36.3 C  Resp: 14    Post vital signs: Reviewed and stable  Level of consciousness: awake, alert  and oriented  Complications: No apparent anesthesia complications

## 2011-04-27 ENCOUNTER — Encounter (HOSPITAL_COMMUNITY): Payer: Self-pay | Admitting: Surgery

## 2011-04-27 LAB — GLUCOSE, CAPILLARY

## 2011-04-27 MED ORDER — SULFAMETHOXAZOLE-TRIMETHOPRIM 800-160 MG PO TABS: 1.0000 | ORAL_TABLET | Freq: Two times a day (BID) | ORAL | Status: AC

## 2011-04-27 MED ORDER — FLUCONAZOLE 150 MG PO TABS: 150.0000 mg | ORAL_TABLET | Freq: Once | ORAL | Status: DC

## 2011-04-27 MED ORDER — OXYCODONE HCL 5 MG PO TABS: 5.0000 mg | ORAL_TABLET | ORAL | Status: AC | PRN

## 2011-04-27 MED ORDER — AMOXICILLIN-POT CLAVULANATE 875-125 MG PO TABS: 1.0000 | ORAL_TABLET | Freq: Two times a day (BID) | ORAL | Status: AC

## 2011-04-27 MED ORDER — INSULIN ASPART 100 UNIT/ML ~~LOC~~ SOLN
0.0000 [IU] | Freq: Three times a day (TID) | SUBCUTANEOUS | Status: DC
  Administered 2011-04-27 (×2): 3 [IU] via SUBCUTANEOUS

## 2011-04-27 MED ORDER — HYDROCODONE-ACETAMINOPHEN 5-325 MG PO TABS
1.0000 | ORAL_TABLET | ORAL | Status: DC | PRN
  Administered 2011-04-27: 2 via ORAL
  Filled 2011-04-27: qty 2

## 2011-04-27 MED FILL — Hydromorphone HCl Inj 1 MG/ML: INTRAMUSCULAR | Qty: 1 | Status: AC

## 2011-04-27 NOTE — Progress Notes (Signed)
Pt with order for [facility] for dressing changes at home. Address and phone numbers (home and cell) in Epic are all correct. Face to face sent to MD. Pt reports having used [facility] in the past. Offered the choice of other agencies and she declined list and wished to remain with [facility].

## 2011-04-27 NOTE — Discharge Summary (Signed)
Patient ID: Cassidy [name] MRN: [id number] DOB/AGE: Jul 31, 1965 46 y.o.  Admit date: 04/26/2011 Discharge date: 04/27/2011  Procedures: I&D of right breast abscess  Consults: None  Reason for Admission: This is a 46 yo female who has developed a recurrent right breast abscess who presented to the [facility] for further evaluation.  Admission Diagnoses:  1. Right breast abscess Patient Active Problem List  Diagnoses  . HERPES, GENITAL NEC  . TINEA VERSICOLOR  . DM  . HYPERCHOLESTEROLEMIA  . BIPOLAR I D/O MOST RECENT EPIS DEPRESSED MOD  . SUBSTANCE ABUSE  . DEPRESSION  . CARPAL TUNNEL SYNDROME, LEFT  . ALLERGIC RHINITIS WITH CONJUNCTIVITIS  . REACTIVE AIRWAY DISEASE  . GERD  . INTERSTITIAL CYSTITIS  . BREAST MASS, RIGHT  . PERIMENOPAUSAL STATUS  . SKIN TAG  . INGROWN HAIR  . NECK PAIN  . PLANTAR FASCIITIS  . LEG PAIN  . CHEST PAIN, NON-CARDIAC  . DYSURIA  . ABDOMINAL/PELVIC SWELLING MASS/LUMP EPIGASTRIC  . CHLAMYDIAL INFECTION, HX OF  . Abscess of right breast    Hospital Course: The patient was admitted and taken to the operating room where she underwent an I&D of a right breast abscess.  She tolerated the procedure well.  On POD# 1, she tolerated her first dressing change well.  Her infection was much improved and she was ready for discharge home.  PE: Right breast: wound was clean, with minimal bleeding.  No purulent drainage.  Minimal induration.  No erythema.  Wound repacked.  Discharge Diagnoses:  Right breast abscess, s/p I&D Patient Active Problem List  Diagnoses  . HERPES, GENITAL NEC  . TINEA VERSICOLOR  . DM  . HYPERCHOLESTEROLEMIA  . BIPOLAR I D/O MOST RECENT EPIS DEPRESSED MOD  . SUBSTANCE ABUSE  . DEPRESSION  . CARPAL TUNNEL SYNDROME, LEFT  . ALLERGIC RHINITIS WITH CONJUNCTIVITIS  . REACTIVE AIRWAY DISEASE  . GERD  . INTERSTITIAL CYSTITIS  . BREAST MASS, RIGHT  . PERIMENOPAUSAL STATUS  . SKIN TAG  . INGROWN HAIR  . NECK PAIN  . PLANTAR  FASCIITIS  . LEG PAIN  . CHEST PAIN, NON-CARDIAC  . DYSURIA  . ABDOMINAL/PELVIC SWELLING MASS/LUMP EPIGASTRIC  . CHLAMYDIAL INFECTION, HX OF  . Abscess of right breast    Discharge Medications: Medication List  As of 04/27/2011  9:58 AM   TAKE these medications         amoxicillin-clavulanate 875-125 MG per tablet   Commonly known as: AUGMENTIN   Take 1 tablet by mouth 2 (two) times daily.      buPROPion 150 MG 12 hr tablet   Commonly known as: WELLBUTRIN SR   Take 150 mg by mouth daily.      divalproex 500 MG 24 hr tablet   Commonly known as: DEPAKOTE ER   Take 500 mg by mouth at bedtime.      doxycycline 100 MG tablet   Commonly known as: ADOXA   Take 100 mg by mouth 2 (two) times daily.      ENABLEX 15 MG 24 hr tablet   Generic drug: darifenacin   Take 15 mg by mouth daily.      exenatide 5 MCG/0.02ML Soln   Commonly known as: BYETTA   Inject 5 mcg into the skin 2 (two) times daily with a meal.      fluconazole 150 MG tablet   Commonly known as: DIFLUCAN   Take 1 tablet (150 mg total) by mouth once.      lisinopril 10 MG  tablet   Commonly known as: PRINIVIL,ZESTRIL   Take 10 mg by mouth daily.      metFORMIN 500 MG tablet   Commonly known as: GLUCOPHAGE   Take 500 mg by mouth 2 (two) times daily with a meal.      naproxen sodium 550 MG tablet   Commonly known as: ANAPROX   Take 550 mg by mouth 2 (two) times daily as needed. For pain      oxyCODONE 5 MG immediate release tablet   Commonly known as: Oxy IR/ROXICODONE   Take 1-2 tablets (5-10 mg total) by mouth every 4 (four) hours as needed.      PARoxetine 37.5 MG 24 hr tablet   Commonly known as: PAXIL-CR   Take 37.5 mg by mouth every morning.      SYSTANE 0.4-0.3 % Soln   Generic drug: Polyethyl Glycol-Propyl Glycol   Apply 2 drops to eye daily as needed. For dry eyes          Bactrim DS, d/w pharmacy, based off of culture he would recommend double coverage for MRSA abd beta lactam  coverage.  Discharge Instructions: Follow-up Information    Schedule an appointment as soon as possible for a visit with [name], MD. (1-2 weeks)    Contact information:   [facility] [address] [place] [address] [phone]          Signed: Letha Cape 04/27/2011, 9:58 AM

## 2011-04-27 NOTE — Discharge Instructions (Signed)
Incision and Drainage Incision and drainage (I&D) is a procedure in which a cavity-like structure (cystic structure) is opened and drained. The cyst to be drained usually contains material such as pus, fluid, or blood. Gauze is sometimes packed into the cut (incision). Keeping a drain or piece of gauze in the incision keeps the skin from healing first. This helps stop the cyst from forming again. HOME CARE INSTRUCTIONS   Only take over-the-counter or prescription medicines for pain, discomfort, or fever as directed by your caregiver. Use these only if your caregiver has not given medicines that would interfere.   See your caregiver as directed for a recheck.   If medicines (antibiotics) that kill germs were prescribed, take them as directed.  SEEK MEDICAL CARE IF:   You develop increased pain, swelling, redness, drainage, or bleeding in the wound.   You develop signs of an infection. These signs include muscle aches, chills, or a general ill feeling.   You have a fever.  MAKE SURE YOU:   Understand these instructions.   Will watch your condition.   Will get help right away if you are not doing well or get worse.  Document Released: 06/23/2000 Document Revised: 12/17/2010 Document Reviewed: 08/18/2007 [facility] Patient Information 2012 [place], [place].  Dressing Change Dressings are placed over wounds to keep them clean, dry, and protected from further injury. This provides an environment that favors wound healing. Good wound care includes resting and elevating the injured part until the pain and swelling are better. Change your wound dressing as recommended by your caregiver. When removing an old dressing, lift it slowly away from the wound. If the dressing sticks to the wound, dampen it with half-strength peroxide or tap water. Clean the wound gently with a moist cloth, remove any loose material, and apply antibiotic ointment if recommended by your caregiver. Usually it is okay for a  wound to get wet. Wash it with mildly soapy water. Watch for signs of infection when changing a dressing. SEEK MEDICAL CARE IF:  You develop increased pain, redness, or swelling.   You have pus-like drainage from the wound.   You develop a fever greater than 100.4 F (38 C).  Document Released: 02/05/2004 Document Revised: 12/17/2010 Document Reviewed: 11/09/2010 [facility] Patient Information 2012 [place], [place].

## 2011-04-27 NOTE — Progress Notes (Signed)
Discharge instructions reviewed with pt and prescriptions given.  Pt verbalized understanding and had no questions.  Pt discharged in stable condition via wheelchair.  Kresta Templeman Lindsay    

## 2011-04-29 ENCOUNTER — Telehealth (INDEPENDENT_AMBULATORY_CARE_PROVIDER_SITE_OTHER): Payer: Self-pay

## 2011-04-29 NOTE — Telephone Encounter (Signed)
Called pt to check on her after getting an I&D from Dr Lindie Spruce on Monday night. The pt is doing well and home health is changing her dressings for her. The pt needs a f/u appt with Dr Lindie Spruce in 1-2 wks. I advised pt that I would have Dr Dixon Boos nurse Marcelino Duster call the pt to make the f/u appt.

## 2011-05-01 LAB — CULTURE, ROUTINE-ABSCESS: Culture: NO GROWTH

## 2011-05-02 LAB — ANAEROBIC CULTURE

## 2011-05-03 ENCOUNTER — Encounter (INDEPENDENT_AMBULATORY_CARE_PROVIDER_SITE_OTHER): Payer: Medicaid Other | Admitting: General Surgery

## 2011-05-04 ENCOUNTER — Telehealth (INDEPENDENT_AMBULATORY_CARE_PROVIDER_SITE_OTHER): Payer: Self-pay

## 2011-05-04 NOTE — Telephone Encounter (Signed)
Pt called c/o severe stomach upset with the Bactrim and Augmentin she was prescribed by Dr. Lindie Spruce post op breast abscess.  I recommended that she take the Augmentin well into a meal.  She could break it in half and take one half beginning of meal and one half midway or end of meal.  Never take on an empty stomach.  Pt understood.  Also, I will fax her culture results to her PCP.

## 2011-05-11 ENCOUNTER — Encounter (INDEPENDENT_AMBULATORY_CARE_PROVIDER_SITE_OTHER): Payer: Medicaid Other | Admitting: General Surgery

## 2011-05-12 ENCOUNTER — Other Ambulatory Visit (INDEPENDENT_AMBULATORY_CARE_PROVIDER_SITE_OTHER): Payer: Self-pay

## 2011-05-12 ENCOUNTER — Telehealth (INDEPENDENT_AMBULATORY_CARE_PROVIDER_SITE_OTHER): Payer: Self-pay | Admitting: General Surgery

## 2011-05-12 DIAGNOSIS — G8918 Other acute postprocedural pain: Secondary | ICD-10-CM

## 2011-05-12 MED ORDER — HYDROCODONE-ACETAMINOPHEN 5-325 MG PO TABS: 1.0000 | ORAL_TABLET | Freq: Four times a day (QID) | ORAL | Status: AC | PRN

## 2011-05-12 NOTE — Telephone Encounter (Signed)
Patient was scheduled to see Dr. Lindie Spruce 05/11/11 patient r/s or NS appt.  New appt scheduled in June.

## 2011-05-12 NOTE — Telephone Encounter (Signed)
Pt calling about her questionable need to continue antibiotics.  She states she has 4 Amoxicillin pills left, but still has a sharp, burning pain in her wound.  [facility] is still doing her dressing changes and told her "the area looks dry."  Refill of pain meds (Hydrocodone 5/325 mg, # 30, 1 po Q 4-6 H prn, NO refill) called to [facility] [address]:  [phone].  Please let her know if she needs additional antibiotics.  Her F/U appt is 06/22/11.

## 2011-05-26 ENCOUNTER — Encounter (INDEPENDENT_AMBULATORY_CARE_PROVIDER_SITE_OTHER): Payer: Self-pay | Admitting: General Surgery

## 2011-05-26 ENCOUNTER — Ambulatory Visit (INDEPENDENT_AMBULATORY_CARE_PROVIDER_SITE_OTHER): Payer: Medicaid Other | Admitting: General Surgery

## 2011-05-26 VITALS — BP 122/70 | HR 84 | Temp 97.2°F | Resp 20 | Ht 67.0 in | Wt 246.0 lb

## 2011-05-26 DIAGNOSIS — N611 Abscess of the breast and nipple: Secondary | ICD-10-CM

## 2011-05-26 DIAGNOSIS — N61 Mastitis without abscess: Secondary | ICD-10-CM

## 2011-05-26 MED ORDER — AMOXICILLIN-POT CLAVULANATE 875-125 MG PO TABS: 1.0000 | ORAL_TABLET | Freq: Two times a day (BID) | ORAL | Status: AC

## 2011-05-26 MED ORDER — FLUCONAZOLE 100 MG PO TABS: 100.0000 mg | ORAL_TABLET | Freq: Every day | ORAL | Status: AC

## 2011-05-26 NOTE — Progress Notes (Signed)
Subjective:     Patient ID: Cassidy [name], female   DOB: 1965-12-03, 46 y.o.   MRN: [id number]  HPI Patient is a 46 year old female with recurrent right breast abscesses. Most recently debrided in the hospital in April. She has been continuing packing at home and noticed today a significant amount of thick yellow greenish purulent drainage. She also has noted increased tenderness in her breast. She has had some streaking redness but denies fever or chills. She denies any discharge from her nipple. She has finished her antibiotics from her hospital visit.  Review of Systems Review of systems is otherwise negative except as noted in history of present illness    Objective:   Physical Exam General: Well-developed well-nourished female who appears in no acute distress Breast: Right breast is slightly warm to the touch, there is an approximately 1 cm wound just above the nipple on the lateral aspect, there is beefy red tissue in the base and no purulent drainage noted, there is a small defect in the base of the wound that does not tract deep. There is slight lymphangitis. The breast is tender to the touch    Assessment:     Recurrent right breast abscess    Plan:     The patient will continue dressing changes. We will start her on Augmentin 875 b.i.d. for 7 days. We will also prescribe Diflucan. At current this does not appear to be a significant infection however given the patient's history we will treat for superficial wound infection. She will followup as scheduled with Dr. Lindie Spruce but noticed contact our office sooner if there are problems.     Doristine Mango A 05/26/2011 1457

## 2011-05-26 NOTE — Patient Instructions (Signed)
Patient will continue current dressing changes. We will start her on Augmentin twice daily for 7 days. She will also have Diflucan. The patient should continue with her followup as scheduled but should call our office with worsening problems to be seen sooner.

## 2011-06-15 ENCOUNTER — Emergency Department (HOSPITAL_COMMUNITY)
Admission: EM | Admit: 2011-06-15 | Discharge: 2011-06-15 | Disposition: A | Discharge status: Home / Self Care | Payer: Medicaid Other | Attending: Emergency Medicine | Admitting: Emergency Medicine

## 2011-06-15 ENCOUNTER — Encounter (HOSPITAL_COMMUNITY): Payer: Self-pay | Admitting: Emergency Medicine

## 2011-06-15 DIAGNOSIS — E119 Type 2 diabetes mellitus without complications: Secondary | ICD-10-CM | POA: Insufficient documentation

## 2011-06-15 DIAGNOSIS — I10 Essential (primary) hypertension: Secondary | ICD-10-CM | POA: Insufficient documentation

## 2011-06-15 DIAGNOSIS — Y9229 Other specified public building as the place of occurrence of the external cause: Secondary | ICD-10-CM | POA: Insufficient documentation

## 2011-06-15 DIAGNOSIS — S161XXA Strain of muscle, fascia and tendon at neck level, initial encounter: Secondary | ICD-10-CM

## 2011-06-15 DIAGNOSIS — K219 Gastro-esophageal reflux disease without esophagitis: Secondary | ICD-10-CM | POA: Insufficient documentation

## 2011-06-15 DIAGNOSIS — S139XXA Sprain of joints and ligaments of unspecified parts of neck, initial encounter: Secondary | ICD-10-CM | POA: Insufficient documentation

## 2011-06-15 DIAGNOSIS — W19XXXA Unspecified fall, initial encounter: Secondary | ICD-10-CM | POA: Insufficient documentation

## 2011-06-15 MED ORDER — OXYCODONE-ACETAMINOPHEN 5-325 MG PO TABS: 1.0000 | ORAL_TABLET | Freq: Four times a day (QID) | ORAL | Status: AC | PRN

## 2011-06-15 MED ORDER — KETOROLAC TROMETHAMINE 60 MG/2ML IM SOLN
60.0000 mg | Freq: Once | INTRAMUSCULAR | Status: AC
  Administered 2011-06-15: 60 mg via INTRAMUSCULAR
  Filled 2011-06-15: qty 2

## 2011-06-15 NOTE — ED Provider Notes (Signed)
History  Scribed for [employer]. Bernette Mayers, MD, the patient was seen in room STRE3/STRE3. This chart was scribed by Candelaria Stagers. The patient's care started at 7:32 PM   CSN: [id number]  Arrival date & time 06/15/11  1846   First MD Initiated Contact with Patient 06/15/11 1930      Chief Complaint  Patient presents with  . Otalgia  . Neck Pain     HPI Cassidy [name] is a 46 y.o. female who presents to the Emergency Department complaining of neck and otalgia for the past three days after falling in a restaurant bathroom. Pt states that the pain radiates to her head and forehead.  She denies hitting her head or face during the fall.  She has taken aleve with no relief.  The pain is worse when chewing.   Past Medical History  Diagnosis Date  . Hypertension   . Depression   . GERD (gastroesophageal reflux disease)   . Breast abscess     right  . Diabetes mellitus     type 2    Past Surgical History  Procedure Date  . Bladder surgery 1980  . Irrigation and debridement abscess 12/19/2010    Procedure: IRRIGATION AND DEBRIDEMENT ABSCESS;  Surgeon: Emelia Loron, MD;  Location: [facility] OR;  Service: General;  Laterality: Right;  . Incise and drain abcess 04/26/11    right breast    Family History  Problem Relation Age of Onset  . COPD Mother   . Cancer Mother     lymphoma  . Cancer Maternal Aunt     breast, colon    History  Substance Use Topics  . Smoking status: Former Smoker -- 0.3 packs/day for 30 years    Types: Cigarettes    Quit date: 05/23/2011  . Smokeless tobacco: Never Used  . Alcohol Use: No     recovering addict-47yrs sober (alcohol, marijuana, crack cocaine)    OB History    Grav Para Term Preterm Abortions TAB SAB Ect Mult Living                  Review of Systems  HENT: Positive for ear pain and neck pain.   Musculoskeletal: Negative for back pain.  Neurological: Positive for headaches.  All other systems reviewed and are  negative.    Allergies  Review of patient's allergies indicates no known allergies.  Home Medications   Current Outpatient Rx  Name Route Sig Dispense Refill  . BUPROPION HCL ER (SR) 150 MG PO TB12 Oral Take 150 mg by mouth daily.      Marland Kitchen DARIFENACIN HYDROBROMIDE ER 15 MG PO TB24 Oral Take 15 mg by mouth daily.    Marland Kitchen DIVALPROEX SODIUM ER 500 MG PO TB24 Oral Take 500 mg by mouth at bedtime.     Marland Kitchen DOXYCYCLINE MONOHYDRATE 100 MG PO TABS Oral Take 100 mg by mouth 2 (two) times daily.    Marland Kitchen EXENATIDE 5 MCG/0.02ML Allison SOLN Subcutaneous Inject 5 mcg into the skin 2 (two) times daily with a meal.      . FLUCONAZOLE 150 MG PO TABS Oral Take 1 tablet (150 mg total) by mouth once. 2 tablet 1  . LISINOPRIL 10 MG PO TABS Oral Take 10 mg by mouth daily.    Marland Kitchen METFORMIN HCL 500 MG PO TABS Oral Take 500 mg by mouth 2 (two) times daily with a meal.      . PAROXETINE HCL ER 37.5 MG PO TB24 Oral Take 37.5  mg by mouth every morning.      Marland Kitchen POLYETHYL GLYCOL-PROPYL GLYCOL 0.4-0.3 % OP SOLN Ophthalmic Apply 2 drops to eye daily as needed. For dry eyes      BP 142/84  Pulse 95  Temp 98.8 F (37.1 C)  Resp 16  SpO2 98%  Physical Exam  Nursing note and vitals reviewed. Constitutional: She is oriented to person, place, and time. She appears well-developed and well-nourished. No distress.  HENT:  Head: Normocephalic and atraumatic.  Right Ear: Tympanic membrane normal.  Left Ear: Tympanic membrane normal.  Eyes: EOM are normal. Pupils are equal, round, and reactive to light.  Neck: Neck supple. No tracheal deviation present.  Cardiovascular: Normal rate.   Pulmonary/Chest: Effort normal. No respiratory distress.  Abdominal: Soft. She exhibits no distension.  Musculoskeletal: Normal range of motion. She exhibits no edema.       Tenderness of the muscles around her neck.  No midline tenderness.   Neurological: She is alert and oriented to person, place, and time. No sensory deficit.  Skin: Skin is warm and  dry.  Psychiatric: She has a normal mood and affect. Her behavior is normal.    ED Course  Procedures  DIAGNOSTIC STUDIES: Oxygen Saturation is 98% on room air, normal by my interpretation.    COORDINATION OF CARE:    Labs Reviewed - No data to display No results found.   No diagnosis found.    MDM  Cervical strain without evidence of intracranial or bony injury. Given pain medications and PCP followup.   I personally performed the services described in the documentation, which were scribed in my presence. The recorded information has been reviewed and considered.            Quindarrius Joplin B. Bernette Mayers, MD 06/17/11 1251

## 2011-06-15 NOTE — ED Notes (Signed)
Pt c/o left ear pain into side of face and neck x 3 days

## 2011-06-15 NOTE — Discharge Instructions (Signed)

## 2011-06-18 ENCOUNTER — Encounter (HOSPITAL_COMMUNITY): Payer: Self-pay

## 2011-06-18 ENCOUNTER — Emergency Department (HOSPITAL_COMMUNITY)
Admission: EM | Admit: 2011-06-18 | Discharge: 2011-06-18 | Disposition: A | Discharge status: Home / Self Care | Payer: Medicaid Other | Attending: Emergency Medicine | Admitting: Emergency Medicine

## 2011-06-18 ENCOUNTER — Emergency Department (HOSPITAL_COMMUNITY): Payer: Medicaid Other

## 2011-06-18 DIAGNOSIS — I10 Essential (primary) hypertension: Secondary | ICD-10-CM | POA: Insufficient documentation

## 2011-06-18 DIAGNOSIS — M542 Cervicalgia: Secondary | ICD-10-CM | POA: Insufficient documentation

## 2011-06-18 DIAGNOSIS — J029 Acute pharyngitis, unspecified: Secondary | ICD-10-CM | POA: Insufficient documentation

## 2011-06-18 DIAGNOSIS — S161XXA Strain of muscle, fascia and tendon at neck level, initial encounter: Secondary | ICD-10-CM

## 2011-06-18 DIAGNOSIS — E119 Type 2 diabetes mellitus without complications: Secondary | ICD-10-CM | POA: Insufficient documentation

## 2011-06-18 DIAGNOSIS — G43909 Migraine, unspecified, not intractable, without status migrainosus: Secondary | ICD-10-CM | POA: Insufficient documentation

## 2011-06-18 MED ORDER — OXYCODONE-ACETAMINOPHEN 5-325 MG PO TABS
1.0000 | ORAL_TABLET | Freq: Once | ORAL | Status: AC
  Administered 2011-06-18: 1 via ORAL
  Filled 2011-06-18: qty 1

## 2011-06-18 MED ORDER — OXYCODONE-ACETAMINOPHEN 5-325 MG PO TABS: 1.0000 | ORAL_TABLET | Freq: Four times a day (QID) | ORAL | Status: AC | PRN

## 2011-06-18 NOTE — ED Provider Notes (Signed)
History   This chart was scribed for Celene Kras, MD by Shari Heritage. The patient was seen in room STRE8/STRE8. Patient's care was started at 1238.     CSN: [id number]  Arrival date & time 06/18/11  1238   First MD Initiated Contact with Patient 06/18/11 1447      Chief Complaint  Patient presents with  . Sore Throat    (Consider location/radiation/quality/duration/timing/severity/associated sxs/prior treatment) HPI Cassidy [name] is a 46 y.o. female who presents to the Emergency Department complaining of neck pain with associated popping in ears and migriane localized above left eye. Patient says that came into the ED on Tuesday with similar symptoms after falling in a restaurant last Friday. Patient was diagnosed with a cervical sprain on Tuesday by Dr. Bernette Mayers. Patient with h/o of HTN, breast abscess, and diabetes. Patient with surgical h/o of bladder surgery, I&D abscess. Patient is a former smoker (quit 05/23/2011 - smoker for 30 years).  Past Medical History  Diagnosis Date  . Hypertension   . Depression   . GERD (gastroesophageal reflux disease)   . Breast abscess     right  . Diabetes mellitus     type 2    Past Surgical History  Procedure Date  . Bladder surgery 1980  . Irrigation and debridement abscess 12/19/2010    Procedure: IRRIGATION AND DEBRIDEMENT ABSCESS;  Surgeon: Emelia Loron, MD;  Location: [facility] OR;  Service: General;  Laterality: Right;  . Incise and drain abcess 04/26/11    right breast    Family History  Problem Relation Age of Onset  . COPD Mother   . Cancer Mother     lymphoma  . Cancer Maternal Aunt     breast, colon    History  Substance Use Topics  . Smoking status: Former Smoker -- 0.3 packs/day for 30 years    Types: Cigarettes    Quit date: 05/23/2011  . Smokeless tobacco: Never Used  . Alcohol Use: No     recovering addict-42yrs sober (alcohol, marijuana, crack cocaine)    OB History    Grav Para Term Preterm Abortions TAB  SAB Ect Mult Living                  Review of Systems A complete 10 system review of systems was obtained and all systems are negative except as noted in the HPI and PMH.   Allergies  Review of patient's allergies indicates no known allergies.  Home Medications   Current Outpatient Rx  Name Route Sig Dispense Refill  . BUPROPION HCL ER (SR) 150 MG PO TB12 Oral Take 150 mg by mouth daily.      Marland Kitchen DARIFENACIN HYDROBROMIDE ER 15 MG PO TB24 Oral Take 15 mg by mouth daily.    Marland Kitchen DIVALPROEX SODIUM ER 500 MG PO TB24 Oral Take 500 mg by mouth at bedtime.     Marland Kitchen DOXYCYCLINE MONOHYDRATE 100 MG PO TABS Oral Take 100 mg by mouth 2 (two) times daily.    Marland Kitchen EXENATIDE 5 MCG/0.02ML [place] SOLN Subcutaneous Inject 5 mcg into the skin 2 (two) times daily with a meal.      . LISINOPRIL 10 MG PO TABS Oral Take 10 mg by mouth daily.    Marland Kitchen METFORMIN HCL 500 MG PO TABS Oral Take 500 mg by mouth 2 (two) times daily with a meal.      . OXYCODONE-ACETAMINOPHEN 5-325 MG PO TABS Oral Take 1-2 tablets by mouth every 6 (six) hours  as needed for pain. 20 tablet 0  . PAROXETINE HCL ER 37.5 MG PO TB24 Oral Take 37.5 mg by mouth every morning.      Marland Kitchen POLYETHYL GLYCOL-PROPYL GLYCOL 0.4-0.3 % OP SOLN Ophthalmic Apply 2 drops to eye daily as needed. For dry eyes      BP 140/80  Pulse 94  Temp 98.2 F (36.8 C)  Resp 18  SpO2 99%  LMP 06/18/2011  Physical Exam  Nursing note and vitals reviewed. Constitutional: She appears well-developed and well-nourished. No distress.  HENT:  Head: Normocephalic and atraumatic.  Right Ear: External ear normal.  Left Ear: External ear normal.  Mouth/Throat: Oropharynx is clear and moist. No oropharyngeal exudate.       TM normal bilaterally.  Eyes: Conjunctivae are normal. Right eye exhibits no discharge. Left eye exhibits no discharge. No scleral icterus.  Neck: Neck supple. No tracheal deviation present.  Cardiovascular: Normal rate, regular rhythm and intact distal pulses.     Pulmonary/Chest: Effort normal and breath sounds normal. No stridor. No respiratory distress. She has no wheezes. She has no rales.  Abdominal: Soft. Bowel sounds are normal. She exhibits no distension. There is no tenderness. There is no rebound and no guarding.  Musculoskeletal: She exhibits tenderness. She exhibits no edema.       Paraspinal tenderness to left cervical region and left trapezius.  Neurological: She is alert. She has normal strength. No sensory deficit. Cranial nerve deficit:  no gross defecits noted. She exhibits normal muscle tone. She displays no seizure activity. Coordination normal.  Skin: Skin is warm and dry. No rash noted.  Psychiatric: She has a normal mood and affect.    ED Course  Procedures (including critical care time) DIAGNOSTIC STUDIES: Oxygen Saturation is 99% on room air, normal by my interpretation.    COORDINATION OF CARE: 2:54PM - Patient informed of current plan for treatment and evaluation and agrees with plan at this time. Will order X-ray of cervical spine.  Labs Reviewed - No data to display Dg Cervical Spine Complete  06/18/2011  *RADIOLOGY REPORT*  Clinical Data: Larey Seat 1 week ago with left-sided neck pain  CERVICAL SPINE - COMPLETE 4+ VIEW  Comparison: None.  Findings: The cervical vertebrae are straightened in alignment. Intervertebral disc spaces appear normal.  Anterior osteophytes are noted at C4-5 and C5-6 level.  No prevertebral soft tissue swelling is seen.  On oblique views the foramina are patent.  The odontoid process is intact and the lung apices are clear.  IMPRESSION: Straightened alignment with only minimal anterior osteophyte formation.  No acute abnormality.  No significant degenerative change.  Original Report Authenticated By: Juline Patch, M.D.    MDM  Patient without signs of fracture or dislocation. Her symptoms are suggestive of cervical strain. The patient continued to take her medications for pain I recommended she consider  following up with her primary care doctor for a physical therapy referral consider seeing a chiropractor.  I personally performed the services described in this documentation, which was scribed in my presence.  The recorded information has been reviewed and considered.    Celene Kras, MD 06/18/11 1534

## 2011-06-18 NOTE — ED Notes (Signed)
Pt  Was here on Tuesday for these symptoms, fell in a restaurant and is not feeling any better, headache, neck pain earache and sore throat

## 2011-06-18 NOTE — Discharge Instructions (Signed)

## 2011-06-22 ENCOUNTER — Ambulatory Visit (INDEPENDENT_AMBULATORY_CARE_PROVIDER_SITE_OTHER): Payer: Medicaid Other | Admitting: General Surgery

## 2011-06-22 ENCOUNTER — Encounter (INDEPENDENT_AMBULATORY_CARE_PROVIDER_SITE_OTHER): Payer: Self-pay

## 2011-06-22 ENCOUNTER — Encounter (INDEPENDENT_AMBULATORY_CARE_PROVIDER_SITE_OTHER): Payer: Self-pay | Admitting: General Surgery

## 2011-06-22 VITALS — BP 124/72 | HR 70 | Temp 97.8°F | Resp 17 | Ht 68.0 in | Wt 241.5 lb

## 2011-06-22 DIAGNOSIS — Z09 Encounter for follow-up examination after completed treatment for conditions other than malignant neoplasm: Secondary | ICD-10-CM

## 2011-06-22 NOTE — Progress Notes (Signed)
HPI The patient is status post right breast and periareolar abscess incision and drainage.  PE On examination there is some mild tenderness in that area but no expressible purulence. There is some indentation of the superior areola area. I cannot palpate any fluctuance to  Studiy review None.  Assessment Status post incision and drainage of recurrent right breast abscess. I'm concerned that there is potential that this will recur. Because of this colitis see the patient again in the near future.  Plan See the patient in one month to reassess previously drained right breast abscess. If at that time she is not recur with an infection and will consider reexcision of that area with primary closure.

## 2011-07-29 ENCOUNTER — Encounter (INDEPENDENT_AMBULATORY_CARE_PROVIDER_SITE_OTHER): Payer: Self-pay | Admitting: General Surgery

## 2011-07-29 ENCOUNTER — Ambulatory Visit (INDEPENDENT_AMBULATORY_CARE_PROVIDER_SITE_OTHER): Payer: Medicaid Other | Admitting: General Surgery

## 2011-07-29 VITALS — BP 128/60 | HR 84 | Temp 95.5°F | Resp 20 | Ht 68.0 in | Wt 239.6 lb

## 2011-07-29 DIAGNOSIS — Z09 Encounter for follow-up examination after completed treatment for conditions other than malignant neoplasm: Secondary | ICD-10-CM

## 2011-07-29 NOTE — Progress Notes (Signed)
The patient is still having significant discomfort in her right breast.  She states that there is some redness and drainage.  On P.E.  Indurated but not inflamed area of the right superior-lateral aspect of the areola.  No palpable abscess cavity.  No fever or chills.  I would not be surprised if the patient got infected again in the future.  If she did then she would probably need to have it drained again and then subsequently have the area completely excised.  I will see the patient again in the future on a when necessary basis. I advised her to take ibuprofen or Tylenol for pain.

## 2011-08-12 ENCOUNTER — Telehealth (INDEPENDENT_AMBULATORY_CARE_PROVIDER_SITE_OTHER): Payer: Self-pay

## 2011-08-12 NOTE — Telephone Encounter (Signed)
Pt reports that the infection in her right breast has returned.  She has been on Bactrim (per her PCP) for 24 hrs.  Low grade fever and pain.

## 2011-08-13 ENCOUNTER — Encounter (INDEPENDENT_AMBULATORY_CARE_PROVIDER_SITE_OTHER): Payer: Self-pay | Admitting: Surgery

## 2011-08-13 ENCOUNTER — Inpatient Hospital Stay (HOSPITAL_COMMUNITY)
Admission: AD | Admit: 2011-08-13 | Discharge: 2011-08-14 | DRG: 600 | Disposition: A | Discharge status: Home / Self Care | Payer: Medicaid Other | Source: Ambulatory Visit | Attending: General Surgery | Admitting: General Surgery

## 2011-08-13 ENCOUNTER — Encounter (HOSPITAL_COMMUNITY): Payer: Self-pay | Admitting: Anesthesiology

## 2011-08-13 ENCOUNTER — Encounter (HOSPITAL_COMMUNITY): Admission: AD | Disposition: A | Discharge status: Home / Self Care | Payer: Self-pay | Source: Ambulatory Visit

## 2011-08-13 ENCOUNTER — Ambulatory Visit (INDEPENDENT_AMBULATORY_CARE_PROVIDER_SITE_OTHER): Payer: Medicaid Other | Admitting: Surgery

## 2011-08-13 ENCOUNTER — Inpatient Hospital Stay (HOSPITAL_COMMUNITY): Payer: Medicaid Other | Admitting: Anesthesiology

## 2011-08-13 VITALS — BP 140/92 | HR 100 | Temp 97.7°F | Resp 24 | Ht 67.0 in | Wt 240.2 lb

## 2011-08-13 DIAGNOSIS — E119 Type 2 diabetes mellitus without complications: Secondary | ICD-10-CM | POA: Diagnosis present

## 2011-08-13 DIAGNOSIS — F17200 Nicotine dependence, unspecified, uncomplicated: Secondary | ICD-10-CM | POA: Diagnosis present

## 2011-08-13 DIAGNOSIS — N61 Mastitis without abscess: Principal | ICD-10-CM | POA: Diagnosis present

## 2011-08-13 DIAGNOSIS — Z79899 Other long term (current) drug therapy: Secondary | ICD-10-CM

## 2011-08-13 DIAGNOSIS — N611 Abscess of the breast and nipple: Secondary | ICD-10-CM

## 2011-08-13 DIAGNOSIS — F3130 Bipolar disorder, current episode depressed, mild or moderate severity, unspecified: Secondary | ICD-10-CM | POA: Diagnosis present

## 2011-08-13 HISTORY — DX: Anogenital (venereal) warts: A63.0

## 2011-08-13 HISTORY — PX: IRRIGATION AND DEBRIDEMENT ABSCESS: SHX5252

## 2011-08-13 HISTORY — DX: Fibromyalgia: M79.7

## 2011-08-13 HISTORY — DX: Bipolar disorder, unspecified: F31.9

## 2011-08-13 HISTORY — DX: Anemia, unspecified: D64.9

## 2011-08-13 HISTORY — DX: Type 2 diabetes mellitus without complications: E11.9

## 2011-08-13 HISTORY — DX: Post-traumatic stress disorder, unspecified: F43.10

## 2011-08-13 HISTORY — DX: Sickle-cell trait: D57.3

## 2011-08-13 LAB — BASIC METABOLIC PANEL
CO2: 20 mEq/L (ref 19–32)
Calcium: 9.2 mg/dL (ref 8.4–10.5)
Chloride: 101 mEq/L (ref 96–112)
Glucose, Bld: 189 mg/dL — ABNORMAL HIGH (ref 70–99)
Sodium: 133 mEq/L — ABNORMAL LOW (ref 135–145)

## 2011-08-13 LAB — CBC
HCT: 37.7 % (ref 36.0–46.0)
Hemoglobin: 13.5 g/dL (ref 12.0–15.0)
MCH: 29.7 pg (ref 26.0–34.0)
MCV: 82.9 fL (ref 78.0–100.0)
Platelets: 352 10*3/uL (ref 150–400)
RBC: 4.55 MIL/uL (ref 3.87–5.11)
WBC: 11.2 10*3/uL — ABNORMAL HIGH (ref 4.0–10.5)

## 2011-08-13 LAB — GRAM STAIN

## 2011-08-13 LAB — SURGICAL PCR SCREEN: Staphylococcus aureus: NEGATIVE

## 2011-08-13 SURGERY — MINOR INCISION AND DRAINAGE OF ABSCESS
Anesthesia: General | Site: Breast | Laterality: Right | Wound class: Dirty or Infected

## 2011-08-13 MED ORDER — SUCCINYLCHOLINE CHLORIDE 20 MG/ML IJ SOLN
INTRAMUSCULAR | Status: DC | PRN
  Administered 2011-08-13: 120 mg via INTRAVENOUS

## 2011-08-13 MED ORDER — PANTOPRAZOLE SODIUM 40 MG IV SOLR
40.0000 mg | Freq: Every day | INTRAVENOUS | Status: DC
  Administered 2011-08-14: 40 mg via INTRAVENOUS
  Filled 2011-08-13 (×3): qty 40

## 2011-08-13 MED ORDER — INSULIN ASPART 100 UNIT/ML ~~LOC~~ SOLN
0.0000 [IU] | SUBCUTANEOUS | Status: DC
  Administered 2011-08-14 (×2): 4 [IU] via SUBCUTANEOUS
  Administered 2011-08-14: 7 [IU] via SUBCUTANEOUS

## 2011-08-13 MED ORDER — DARIFENACIN HYDROBROMIDE ER 15 MG PO TB24
15.0000 mg | ORAL_TABLET | Freq: Every day | ORAL | Status: DC
  Administered 2011-08-14: 15 mg via ORAL
  Filled 2011-08-13: qty 1

## 2011-08-13 MED ORDER — CEFAZOLIN SODIUM-DEXTROSE 2-3 GM-% IV SOLR
2.0000 g | Freq: Three times a day (TID) | INTRAVENOUS | Status: DC
  Administered 2011-08-13 – 2011-08-14 (×3): 2 g via INTRAVENOUS
  Filled 2011-08-13 (×5): qty 50

## 2011-08-13 MED ORDER — HYDROMORPHONE HCL PF 1 MG/ML IJ SOLN
0.2500 mg | INTRAMUSCULAR | Status: DC | PRN
  Administered 2011-08-13 (×3): 0.5 mg via INTRAVENOUS

## 2011-08-13 MED ORDER — ONDANSETRON HCL 4 MG/2ML IJ SOLN
INTRAMUSCULAR | Status: DC | PRN
  Administered 2011-08-13: 4 mg via INTRAVENOUS

## 2011-08-13 MED ORDER — DIVALPROEX SODIUM ER 500 MG PO TB24
500.0000 mg | ORAL_TABLET | Freq: Every day | ORAL | Status: DC
  Administered 2011-08-14: 500 mg via ORAL
  Filled 2011-08-13 (×2): qty 1

## 2011-08-13 MED ORDER — METFORMIN HCL 500 MG PO TABS
500.0000 mg | ORAL_TABLET | Freq: Two times a day (BID) | ORAL | Status: DC
  Administered 2011-08-14: 500 mg via ORAL
  Filled 2011-08-13 (×3): qty 1

## 2011-08-13 MED ORDER — BUPROPION HCL ER (SR) 150 MG PO TB12
150.0000 mg | ORAL_TABLET | Freq: Every day | ORAL | Status: DC
  Administered 2011-08-14: 150 mg via ORAL
  Filled 2011-08-13: qty 1

## 2011-08-13 MED ORDER — HYDROCODONE-ACETAMINOPHEN 5-325 MG PO TABS
1.0000 | ORAL_TABLET | ORAL | Status: DC | PRN
  Administered 2011-08-14 (×2): 2 via ORAL
  Filled 2011-08-13 (×2): qty 2

## 2011-08-13 MED ORDER — 0.9 % SODIUM CHLORIDE (POUR BTL) OPTIME
TOPICAL | Status: DC | PRN
  Administered 2011-08-13: 1000 mL

## 2011-08-13 MED ORDER — ONDANSETRON HCL 4 MG/2ML IJ SOLN: 4.0000 mg | Freq: Once | INTRAMUSCULAR | Status: AC | PRN

## 2011-08-13 MED ORDER — LIDOCAINE HCL (CARDIAC) 20 MG/ML IV SOLN
INTRAVENOUS | Status: DC | PRN
  Administered 2011-08-13: 100 mg via INTRAVENOUS

## 2011-08-13 MED ORDER — HYDROMORPHONE HCL PF 1 MG/ML IJ SOLN
1.0000 mg | INTRAMUSCULAR | Status: DC | PRN
  Administered 2011-08-13 – 2011-08-14 (×3): 1 mg via INTRAVENOUS
  Filled 2011-08-13 (×3): qty 1

## 2011-08-13 MED ORDER — LACTATED RINGERS IV SOLN
INTRAVENOUS | Status: DC | PRN
  Administered 2011-08-13: 20:00:00 via INTRAVENOUS

## 2011-08-13 MED ORDER — FENTANYL CITRATE 0.05 MG/ML IJ SOLN
INTRAMUSCULAR | Status: DC | PRN
  Administered 2011-08-13: 50 ug via INTRAVENOUS
  Administered 2011-08-13 (×2): 100 ug via INTRAVENOUS

## 2011-08-13 MED ORDER — PAROXETINE HCL ER 37.5 MG PO TB24
37.5000 mg | ORAL_TABLET | ORAL | Status: DC
  Administered 2011-08-14: 37.5 mg via ORAL
  Filled 2011-08-13 (×2): qty 1

## 2011-08-13 MED ORDER — EXENATIDE 5 MCG/0.02ML ~~LOC~~ SOPN
5.0000 ug | PEN_INJECTOR | Freq: Two times a day (BID) | SUBCUTANEOUS | Status: DC
  Filled 2011-08-13: qty 1.2

## 2011-08-13 MED ORDER — LISINOPRIL 10 MG PO TABS
10.0000 mg | ORAL_TABLET | Freq: Every day | ORAL | Status: DC
  Administered 2011-08-14: 10 mg via ORAL
  Filled 2011-08-13: qty 1

## 2011-08-13 MED ORDER — LIDOCAINE HCL 4 % MT SOLN
OROMUCOSAL | Status: DC | PRN
  Administered 2011-08-13: 4 mL via TOPICAL

## 2011-08-13 MED ORDER — POTASSIUM CHLORIDE IN NACL 20-0.9 MEQ/L-% IV SOLN
INTRAVENOUS | Status: DC
  Administered 2011-08-13 – 2011-08-14 (×2): via INTRAVENOUS
  Filled 2011-08-13 (×3): qty 1000

## 2011-08-13 MED ORDER — MIDAZOLAM HCL 5 MG/5ML IJ SOLN
INTRAMUSCULAR | Status: DC | PRN
  Administered 2011-08-13: 2 mg via INTRAVENOUS

## 2011-08-13 MED ORDER — PROPOFOL 10 MG/ML IV EMUL
INTRAVENOUS | Status: DC | PRN
  Administered 2011-08-13: 150 mg via INTRAVENOUS

## 2011-08-13 MED ORDER — ONDANSETRON HCL 4 MG/2ML IJ SOLN
4.0000 mg | Freq: Four times a day (QID) | INTRAMUSCULAR | Status: DC | PRN
  Administered 2011-08-14: 4 mg via INTRAVENOUS
  Filled 2011-08-13: qty 2

## 2011-08-13 SURGICAL SUPPLY — 48 items
ADH SKN CLS APL DERMABOND .7 (GAUZE/BANDAGES/DRESSINGS)
BINDER BREAST LRG (GAUZE/BANDAGES/DRESSINGS) IMPLANT
BINDER BREAST XLRG (GAUZE/BANDAGES/DRESSINGS) IMPLANT
BLADE SURG 15 STRL LF DISP TIS (BLADE) ×2 IMPLANT
BLADE SURG 15 STRL SS (BLADE) ×3
CANISTER SUCTION 2500CC (MISCELLANEOUS) IMPLANT
CHLORAPREP W/TINT 26ML (MISCELLANEOUS) ×3 IMPLANT
CLEANER TIP ELECTROSURG 2X2 (MISCELLANEOUS) ×3 IMPLANT
CLOTH BEACON ORANGE TIMEOUT ST (SAFETY) ×3 IMPLANT
CONT SPEC 4OZ CLIKSEAL STRL BL (MISCELLANEOUS) IMPLANT
COVER SURGICAL LIGHT HANDLE (MISCELLANEOUS) ×3 IMPLANT
DECANTER SPIKE VIAL GLASS SM (MISCELLANEOUS) ×1 IMPLANT
DERMABOND ADVANCED (GAUZE/BANDAGES/DRESSINGS)
DERMABOND ADVANCED .7 DNX12 (GAUZE/BANDAGES/DRESSINGS) ×1 IMPLANT
DRAPE PED LAPAROTOMY (DRAPES) ×1 IMPLANT
DRAPE UTILITY 15X26 W/TAPE STR (DRAPE) ×6 IMPLANT
DRSG TEGADERM 4X4.75 (GAUZE/BANDAGES/DRESSINGS) IMPLANT
ELECT REM PT RETURN 9FT ADLT (ELECTROSURGICAL) ×3
ELECTRODE REM PT RTRN 9FT ADLT (ELECTROSURGICAL) ×2 IMPLANT
GAUZE PACKING IODOFORM 1/4X5 (PACKING) ×2 IMPLANT
GAUZE SPONGE 4X4 16PLY XRAY LF (GAUZE/BANDAGES/DRESSINGS) ×3 IMPLANT
GLOVE BIOGEL PI IND STRL 7.0 (GLOVE) ×1 IMPLANT
GLOVE BIOGEL PI IND STRL 8 (GLOVE) ×2 IMPLANT
GLOVE BIOGEL PI INDICATOR 7.0 (GLOVE) ×1
GLOVE BIOGEL PI INDICATOR 8 (GLOVE) ×1
GLOVE ECLIPSE 6.5 STRL STRAW (GLOVE) ×2 IMPLANT
GLOVE ECLIPSE 7.5 STRL STRAW (GLOVE) ×3 IMPLANT
GOWN STRL NON-REIN LRG LVL3 (GOWN DISPOSABLE) ×6 IMPLANT
KIT BASIN OR (CUSTOM PROCEDURE TRAY) ×3 IMPLANT
KIT ROOM TURNOVER OR (KITS) ×3 IMPLANT
NDL HYPO 25GX1X1/2 BEV (NEEDLE) ×1 IMPLANT
NEEDLE HYPO 25GX1X1/2 BEV (NEEDLE) ×3 IMPLANT
NS IRRIG 1000ML POUR BTL (IV SOLUTION) ×3 IMPLANT
PACK SURGICAL SETUP 50X90 (CUSTOM PROCEDURE TRAY) ×3 IMPLANT
PAD ARMBOARD 7.5X6 YLW CONV (MISCELLANEOUS) ×6 IMPLANT
PENCIL BUTTON HOLSTER BLD 10FT (ELECTRODE) ×3 IMPLANT
STRIP CLOSURE SKIN 1/4X4 (GAUZE/BANDAGES/DRESSINGS) ×1 IMPLANT
SUT VIC AB 4-0 SH 27 (SUTURE) ×3
SUT VIC AB 4-0 SH 27XBRD (SUTURE) ×2 IMPLANT
SUT VIC AB 5-0 P-3 18XBRD (SUTURE) ×2 IMPLANT
SUT VIC AB 5-0 P3 18 (SUTURE) ×3
SYR CONTROL 10ML LL (SYRINGE) ×3 IMPLANT
TAPE CLOTH SURG 4X10 WHT LF (GAUZE/BANDAGES/DRESSINGS) ×2 IMPLANT
TOWEL OR 17X24 6PK STRL BLUE (TOWEL DISPOSABLE) ×1 IMPLANT
TOWEL OR 17X26 10 PK STRL BLUE (TOWEL DISPOSABLE) ×3 IMPLANT
TUBE CONNECTING 12X1/4 (SUCTIONS) ×2 IMPLANT
WATER STERILE IRR 1000ML POUR (IV SOLUTION) IMPLANT
YANKAUER SUCT BULB TIP NO VENT (SUCTIONS) ×2 IMPLANT

## 2011-08-13 NOTE — Preoperative (Signed)
Beta Blockers   Reason not to administer Beta Blockers:Not Applicable 

## 2011-08-13 NOTE — Transfer of Care (Signed)
Immediate Anesthesia Transfer of Care Note  Patient: Cassidy [name]  Procedure(s) Performed: Procedure(s) (LRB): MINOR INCISION AND DRAINAGE OF ABSCESS (Right) BREAST BIOPSY (Right)  Patient Location: PACU  Anesthesia Type: General  Level of Consciousness: awake, alert  and oriented  Airway & Oxygen Therapy: Patient Spontanous Breathing and Patient connected to nasal cannula oxygen  Post-op Assessment: Report given to PACU RN and Post -op Vital signs reviewed and stable  Post vital signs: Reviewed and stable  Complications: No apparent anesthesia complications

## 2011-08-13 NOTE — Anesthesia Postprocedure Evaluation (Signed)
Anesthesia Post Note  Patient: Cassidy [name]  Procedure(s) Performed: Procedure(s) (LRB): MINOR INCISION AND DRAINAGE OF ABSCESS (Right)  Anesthesia type: general  Patient location: PACU  Post pain: Pain level controlled  Post assessment: Patient's Cardiovascular Status Stable  Last Vitals:  Filed Vitals:   08/13/11 2200  BP: 127/74  Pulse: 77  Temp:   Resp: 13    Post vital signs: Reviewed and stable  Level of consciousness: sedated  Complications: No apparent anesthesia complications

## 2011-08-13 NOTE — Op Note (Signed)
OPERATIVE REPORT  DATE OF OPERATION: 08/13/2011  PATIENT:  Cassidy [name]  46 y.o. female  PRE-OPERATIVE DIAGNOSIS:  Right Breast Abscess  POST-OPERATIVE DIAGNOSIS:  Right Infra-areolar breast abscess  PROCEDURE:  Procedure(s): MINOR INCISION AND DRAINAGE OF ABSCESS BREAST BIOPSY  SURGEON:  Surgeon(s): Cherylynn Ridges, MD  ASSISTANT: None  ANESTHESIA:   general  EBL: <20 ml  BLOOD ADMINISTERED: none  DRAINS: Wound packed with bottle of 1/4 inch Iodoform NuGauze   SPECIMEN:  Source of Specimen:  green mucopurulent fluid and Aspirate  COUNTS CORRECT:  YES  PROCEDURE DETAILS: The patient was taken to the operating room and placed on the table in the supine position.  After an adequate endotracheal anesthetic was administered, she was prepped and draped in the usual sterile manner, exposing her right breast.  After a proper timeout was performed identifying the patient and the procedure to be performed with the correct side marked preoperatively, a 10cc syringe attached to a 18 gauge needle was used to aspirate 5.0 cc of greenish mucopurulent fluid from the abscess cavity in the inferior aspect of the right breast.  We then made a 1.5cm incision at the site of aspiration, then used a hemostat clamp to open the scarred cavity.  We then packed the wound with an entire bottle of 1/4 inch Iodoform NuGauze.  A sterile dressing was applied.  All counts were correct.  PATIENT DISPOSITION:  PACU - hemodynamically stable.   Cherylynn Ridges 8/2/20139:18 PM

## 2011-08-13 NOTE — Progress Notes (Signed)
ADmitted to room 6N 25 from MD office complaining of recurrent abscess to right breast, tender to touch , no drainage , no opening noted. Patient schedule for I& D. VSS, Will continue to monitor.

## 2011-08-13 NOTE — Anesthesia Procedure Notes (Signed)
Procedure Name: Intubation Date/Time: 08/13/2011 8:51 PM Performed by: Nicholos Johns Pre-anesthesia Checklist: Patient identified, Emergency Drugs available, Suction available, Patient being monitored and Timeout performed Patient Re-evaluated:Patient Re-evaluated prior to inductionOxygen Delivery Method: Circle system utilized Preoxygenation: Pre-oxygenation with 100% oxygen Intubation Type: IV induction Ventilation: Mask ventilation without difficulty Laryngoscope Size: Mac and 3 Grade View: Grade I Tube type: Oral Tube size: 7.5 mm Number of attempts: 1 Airway Equipment and Method: Stylet Placement Confirmation: ETT inserted through vocal cords under direct vision,  positive ETCO2 and breath sounds checked- equal and bilateral Secured at: 22 cm Tube secured with: Tape Dental Injury: Teeth and Oropharynx as per pre-operative assessment

## 2011-08-13 NOTE — Progress Notes (Signed)
Patient ID: Cassidy [name], female   DOB: Nov 23, 1965, 46 y.o.   MRN: [id number]  Chief Complaint  Patient presents with  . Abscess    reck lt breast abscess    HPI Cassidy [name] is a 46 y.o. female.  HPI She presents with a recurrent right breast abscess. Her last incision and drainage was done in the operating room by Dr. Lindie Spruce.  She last saw him on July 18 and was doing well. She started noticing increasing pain and swelling approximately 4 days ago. She is currently on antibiotics. She denies any nipple discharge. She is in and significant amount of pain and is crying  Past Medical History  Diagnosis Date  . Hypertension   . Depression   . GERD (gastroesophageal reflux disease)   . Breast abscess     right  . Diabetes mellitus     type 2    Past Surgical History  Procedure Date  . Bladder surgery 1980  . Irrigation and debridement abscess 12/19/2010    Procedure: IRRIGATION AND DEBRIDEMENT ABSCESS;  Surgeon: Emelia Loron, MD;  Location: [facility] OR;  Service: General;  Laterality: Right;  . Incise and drain abcess 04/26/11    right breast    Family History  Problem Relation Age of Onset  . COPD Mother   . Cancer Mother     lymphoma  . Cancer Maternal Aunt     breast, colon    Social History History  Substance Use Topics  . Smoking status: Former Smoker -- 0.3 packs/day for 30 years    Types: Cigarettes    Quit date: 05/23/2011  . Smokeless tobacco: Never Used  . Alcohol Use: No     recovering addict-42yrs sober (alcohol, marijuana, crack cocaine)    No Known Allergies  Current Outpatient Prescriptions  Medication Sig Dispense Refill  . buPROPion (WELLBUTRIN SR) 150 MG 12 hr tablet Take 150 mg by mouth daily.        Marland Kitchen darifenacin (ENABLEX) 15 MG 24 hr tablet Take 15 mg by mouth daily.      . divalproex (DEPAKOTE ER) 500 MG 24 hr tablet Take 500 mg by mouth at bedtime.       Marland Kitchen exenatide (BYETTA) 5 MCG/0.02ML SOLN Inject 5 mcg into the skin 2 (two) times daily  with a meal.        . lisinopril (PRINIVIL,ZESTRIL) 10 MG tablet Take 10 mg by mouth daily.      . metFORMIN (GLUCOPHAGE) 500 MG tablet Take 500 mg by mouth 2 (two) times daily with a meal.        . PARoxetine (PAXIL-CR) 37.5 MG 24 hr tablet Take 37.5 mg by mouth every morning.        Bertram Gala Glycol-Propyl Glycol (SYSTANE) 0.4-0.3 % SOLN Apply 2 drops to eye daily as needed. For dry eyes      . sulfamethoxazole-trimethoprim (BACTRIM DS) 800-160 MG per tablet Take 1 tablet by mouth 2 (two) times daily.        Review of Systems Review of Systems  All other systems reviewed and are negative.    Blood pressure 140/92, pulse 100, temperature 97.7 F (36.5 C), temperature source Temporal, resp. rate 24, height 5\' 7"  (1.702 m), weight 240 lb 3.2 oz (108.954 kg).  Physical Exam Physical Exam  Constitutional: She is oriented to person, place, and time. She appears well-developed and well-nourished. She appears distressed.  HENT:  Head: Normocephalic and atraumatic.  Right Ear: External ear normal.  Left Ear: External ear normal.  Eyes: Conjunctivae are normal. Pupils are equal, round, and reactive to light. No scleral icterus.  Neck: Normal range of motion. Neck supple. No tracheal deviation present. No thyromegaly present.  Cardiovascular: Normal rate, regular rhythm, normal heart sounds and intact distal pulses.   No murmur heard. Pulmonary/Chest: Effort normal and breath sounds normal. No respiratory distress. She has no wheezes.  Abdominal: Soft. She exhibits no distension. There is no tenderness.  Musculoskeletal: Normal range of motion.  Lymphadenopathy:    She has no cervical adenopathy.  Neurological: She is alert and oriented to person, place, and time.  Skin: Skin is warm and dry. She is not diaphoretic. No erythema.  Psychiatric: Her behavior is normal. Judgment normal.  Breasts: She has significant tenderness and fullness around the right areola. There is no erythema but  there is induration and fluctuance.  Data Reviewed   Assessment    Recurrent right breast abscess    Plan    This is to significantly tender to perform an I&D in the office. I have discussed this with Dr. Lindie Spruce and I will admit her to the hospital for incision and drainage in the operating room. I will check preoperative labs and keep her n.p.o. as well as start IV antibiotics       Adalida Garver A 08/13/2011, 3:53 PM

## 2011-08-13 NOTE — Anesthesia Preprocedure Evaluation (Addendum)
Anesthesia Evaluation  Patient identified by MRN, date of birth, ID band Patient awake    Reviewed: Allergy & Precautions, H&P , NPO status , Patient's Chart, lab work & pertinent test results  Airway Mallampati: I TM Distance: >3 FB Neck ROM: Full    Dental   Pulmonary          Cardiovascular hypertension, Pt. on medications     Neuro/Psych Depression    GI/Hepatic GERD-  Medicated and Controlled,  Endo/Other  Well Controlled, Type 2, Oral Hypoglycemic Agents  Renal/GU      Musculoskeletal   Abdominal   Peds  Hematology   Anesthesia Other Findings   Reproductive/Obstetrics                          Anesthesia Physical Anesthesia Plan  ASA: II  Anesthesia Plan: General   Post-op Pain Management:    Induction: Intravenous  Airway Management Planned: Oral ETT  Additional Equipment:   Intra-op Plan:   Post-operative Plan: Extubation in OR  Informed Consent: I have reviewed the patients History and Physical, chart, labs and discussed the procedure including the risks, benefits and alternatives for the proposed anesthesia with the patient or authorized representative who has indicated his/her understanding and acceptance.     Plan Discussed with: CRNA and Surgeon  Anesthesia Plan Comments:         Anesthesia Quick Evaluation

## 2011-08-14 ENCOUNTER — Encounter (HOSPITAL_COMMUNITY): Payer: Self-pay | Admitting: General Practice

## 2011-08-14 LAB — GLUCOSE, CAPILLARY

## 2011-08-14 MED ORDER — HYDROCODONE-ACETAMINOPHEN 5-325 MG PO TABS: 1.0000 | ORAL_TABLET | ORAL | Status: AC | PRN

## 2011-08-14 NOTE — Discharge Summary (Signed)
   Patient ID: Cassidy [name] [id number] 45 y.o. 08/31/1965  08/13/2011  Discharge date and time: 08/14/2011   Admitting Physician: Frederik Schmidt M.D.  Discharge Physician: Glenna Fellows T  Admission Diagnoses: right breast abscess Right Breast Abscess  Discharge Diagnoses: same  Operations: Procedure(s): MINOR INCISION AND DRAINAGE OF ABSCESS  Admission Condition: fair  Discharged Condition: good  Indication for Admission: patient is a 46 year old female with previous history of periareolar right breast abscess who presents with recurrent pain and swelling at the right nipple and had findings of a large lateral periareolar abscess  Hospital Course: patient was admitted and treated with IV antibiotics. She had urgent incision and drainage of her abscess under general anesthesia. On postop day 1 she was feeling better. The packing was removed and the cavity appeared clean and well drained. She is discharged home at this time with wound care and oral antibiotics to follow up in the office.   Disposition: Home  Patient Instructions:   [name], [name]  Home Medication Instructions WUJ:[id number]   Printed on:08/14/11 1028  Medication Information                    metFORMIN (GLUCOPHAGE) 500 MG tablet Take 500 mg by mouth 2 (two) times daily with a meal.             divalproex (DEPAKOTE ER) 500 MG 24 hr tablet Take 500 mg by mouth at bedtime.            PARoxetine (PAXIL-CR) 37.5 MG 24 hr tablet Take 37.5 mg by mouth every morning.             buPROPion (WELLBUTRIN SR) 150 MG 12 hr tablet Take 150 mg by mouth daily.             exenatide (BYETTA) 5 MCG/0.02ML SOLN Inject 5 mcg into the skin 2 (two) times daily with a meal.             lisinopril (PRINIVIL,ZESTRIL) 10 MG tablet Take 10 mg by mouth daily.           darifenacin (ENABLEX) 15 MG 24 hr tablet Take 15 mg by mouth daily.           Polyethyl Glycol-Propyl Glycol (SYSTANE) 0.4-0.3 % SOLN Apply 2 drops to  eye daily as needed. For dry eyes           sulfamethoxazole-trimethoprim (BACTRIM DS) 800-160 MG per tablet Take 1 tablet by mouth 2 (two) times daily. X 10 days. Started on 08/09/11.           HYDROcodone-acetaminophen (NORCO/VICODIN) 5-325 MG per tablet Take 1-2 tablets by mouth every 4 (four) hours as needed.             Activity: activity as tolerated Diet: regular diet Wound Care: Clent Ridges area in shower at least daily and cover with clean gauze dressing  Follow-up:  With Dr. Lindie Spruce in 2 weeks.  Signed: Mariella Saa MD, FACS  08/14/2011, 10:28 AM

## 2011-08-14 NOTE — Progress Notes (Signed)
Patient ID: Cassidy [name], female   DOB: 01-30-1965, 46 y.o.   MRN: [id number] 1 Day Post-Op  Subjective: Some burning pain and incision but overall feels better  Objective: Vital signs in last 24 hours: Temp:  [97.2 F (36.2 C)-98.6 F (37 C)] 98.4 F (36.9 C) (08/03 0526) Pulse Rate:  [73-100] 77  (08/03 0526) Resp:  [11-24] 16  (08/03 0526) BP: (94-140)/(59-92) 101/67 mmHg (08/03 0526) SpO2:  [97 %-100 %] 98 % (08/03 0526) Weight:  [240 lb (108.863 kg)-240 lb 3.2 oz (108.954 kg)] 240 lb (108.863 kg) (08/03 0309)    Intake/Output from previous day: 08/02 0701 - 08/03 0700 In: 1550 [P.O.:100; I.V.:1450] Out: 5 [Blood:5] Intake/Output this shift:    Incision/Wound: packing removed. Wound appears clean and well drained. No significant erythema or induration.  Lab Results:   [facility] 08/13/11 1717  WBC 11.2*  HGB 13.5  HCT 37.7  PLT 352   BMET  Basename 08/13/11 1717  NA 133*  K 4.0  CL 101  CO2 20  GLUCOSE 189*  BUN 10  CREATININE 0.71  CALCIUM 9.2     Studies/Results: No results found.  Anti-infectives: Anti-infectives     Start     Dose/Rate Route Frequency Ordered Stop   08/13/11 1800   ceFAZolin (ANCEF) IVPB 2 g/50 mL premix        2 g 100 mL/hr over 30 Minutes Intravenous Every 8 hours 08/13/11 1709            Assessment/Plan: s/p Procedure(s): MINOR INCISION AND DRAINAGE OF ABSCESS Doing well. Okay for discharge on oral antibiotics and followup in the office.   LOS: 1 day    Latha Staunton T 08/14/2011

## 2011-08-16 ENCOUNTER — Encounter (HOSPITAL_COMMUNITY): Payer: Self-pay | Admitting: General Surgery

## 2011-08-16 LAB — CULTURE, ROUTINE-ABSCESS

## 2011-08-18 LAB — ANAEROBIC CULTURE

## 2011-08-25 ENCOUNTER — Ambulatory Visit: Payer: Medicaid Other | Attending: Internal Medicine | Admitting: Rehabilitative and Restorative Service Providers"

## 2011-08-25 DIAGNOSIS — M25519 Pain in unspecified shoulder: Secondary | ICD-10-CM | POA: Insufficient documentation

## 2011-08-25 DIAGNOSIS — IMO0001 Reserved for inherently not codable concepts without codable children: Secondary | ICD-10-CM | POA: Insufficient documentation

## 2011-08-25 DIAGNOSIS — M542 Cervicalgia: Secondary | ICD-10-CM | POA: Insufficient documentation

## 2011-09-14 ENCOUNTER — Encounter (INDEPENDENT_AMBULATORY_CARE_PROVIDER_SITE_OTHER): Payer: Self-pay | Admitting: General Surgery

## 2011-09-14 ENCOUNTER — Ambulatory Visit (INDEPENDENT_AMBULATORY_CARE_PROVIDER_SITE_OTHER): Payer: Medicaid Other | Admitting: Surgery

## 2011-09-14 ENCOUNTER — Other Ambulatory Visit (INDEPENDENT_AMBULATORY_CARE_PROVIDER_SITE_OTHER): Payer: Self-pay | Admitting: Surgery

## 2011-09-14 VITALS — BP 120/72 | HR 94 | Temp 97.6°F | Ht 67.5 in | Wt 239.2 lb

## 2011-09-14 DIAGNOSIS — N611 Abscess of the breast and nipple: Secondary | ICD-10-CM

## 2011-09-14 DIAGNOSIS — N61 Mastitis without abscess: Secondary | ICD-10-CM

## 2011-09-14 MED ORDER — OXYCODONE-ACETAMINOPHEN 5-325 MG PO TABS: 1.0000 | ORAL_TABLET | ORAL | Status: DC | PRN

## 2011-09-14 MED ORDER — DOXYCYCLINE HYCLATE 100 MG PO CAPS: 100.0000 mg | ORAL_CAPSULE | Freq: Two times a day (BID) | ORAL | Status: DC

## 2011-09-14 NOTE — Progress Notes (Signed)
Patient ID: [name], female   DOB: Nov 01, 1965, 46 y.o.   MRN: [id number]  Chief Complaint  Patient presents with  . Other    urge off/i&D br abs    HPI Cassidy [name] is a 46 y.o. female.   HPI Urgent office on 09/14/11. The patient comes in for recheck of her right breast abscess. The patient has had multiple surgeries to drain this breast abscess. It continues to recur. He has become quite tender and painful in the upper part of her breast. Her last surgery was a month ago by Dr. Lindie Spruce. It is unclear why she continues to have these recurrent abscesses. She has not had any recent imaging of her breast.      Past Medical History  Diagnosis Date  . Hypertension   . Depression   . GERD (gastroesophageal reflux disease)   . Breast abscess     recurrent; on right  . High cholesterol   . Type II diabetes mellitus   . Anemia   . Sickle cell trait   . Fibromyalgia   . Bipolar depression   . PTSD (post-traumatic stress disorder)   . Genital warts     Past Surgical History  Procedure Date  . Bladder surgery 1980  . Irrigation and debridement abscess 12/19/2010    Procedure: IRRIGATION AND DEBRIDEMENT ABSCESS;  Surgeon: Emelia Loron, MD;  Location: [facility] OR;  Service: General;  Laterality: Right;  . Incise and drain abcess ; 04/26/11; 08/13/11    right breast  . Breast surgery     I&D abscess; right nipple area  . Tonsillectomy 1988  . Eye surgery   . Refractive surgery ~ 2010    right  . Irrigation and debridement abscess 08/13/2011    Procedure: MINOR INCISION AND DRAINAGE OF ABSCESS;  Surgeon: Cherylynn Ridges, MD;  Location: [facility] OR;  Service: General;  Laterality: Right;  Right Breast     Family History  Problem Relation Age of Onset  . COPD Mother   . Cancer Mother     lymphoma  . Cancer Maternal Aunt     breast, colon    Social History History  Substance Use Topics  . Smoking status: Current Everyday Smoker -- 0.2 packs/day for 30 years    Types: Cigarettes    . Smokeless tobacco: Never Used   Comment: 08/14/11 "quit for 3 wk 05/2011; was smoking 1 ppd since age 37 before I quit; at least I've cut down to 1/4 ppd"  . 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 buPROPion (WELLBUTRIN SR) 150 MG 12 hr tablet Take 150 mg by mouth daily.        Marland Kitchen darifenacin (ENABLEX) 15 MG 24 hr tablet Take 15 mg by mouth daily.      . divalproex (DEPAKOTE ER) 500 MG 24 hr tablet Take 500 mg by mouth at bedtime.       Marland Kitchen exenatide (BYETTA) 5 MCG/0.02ML SOLN Inject 5 mcg into the skin 2 (two) times daily with a meal.        . lisinopril (PRINIVIL,ZESTRIL) 10 MG tablet Take 10 mg by mouth daily.      . metFORMIN (GLUCOPHAGE) 500 MG tablet Take 500 mg by mouth 2 (two) times daily with a meal.        . PARoxetine (PAXIL-CR) 37.5 MG 24 hr  tablet Take 37.5 mg by mouth every morning.        Bertram Gala Glycol-Propyl Glycol (SYSTANE) 0.4-0.3 % SOLN Apply 2 drops to eye daily as needed. For dry eyes      . sulfamethoxazole-trimethoprim (BACTRIM DS) 800-160 MG per tablet Take 1 tablet by mouth 2 (two) times daily. X 10 days. Started on 08/09/11.      Marland Kitchen doxycycline (VIBRAMYCIN) 100 MG capsule Take 1 capsule (100 mg total) by mouth 2 (two) times daily.  28 capsule  0  . oxyCODONE-acetaminophen (PERCOCET/ROXICET) 5-325 MG per tablet Take 1 tablet by mouth every 4 (four) hours as needed for pain.  40 tablet  0    Review of Systems Review of Systems  Blood pressure 120/72, pulse 94, temperature 97.6 F (36.4 C), temperature source Temporal, height 5' 7.5" (1.715 m), weight 239 lb 3.2 oz (108.5 kg), SpO2 96.00%.  Physical Exam Physical Exam Her right breast shows that all of the incisions are well healed around her areola. The upper part of her areola is contracted. This area is erythematous and very tender. To deep palpation the mass seems to  be about 3 cm across.  Data Reviewed   Assessment    Impression: Recurrent breast abscess, right-unclear etiology     Plan     Plan: Will obtain a right breast ultrasound to determine the size of the abscess cavity. Later this week we will need to take her to the operating room for a wide debridement and drainage of the abscess. We will try to debride as much of the necrotic infected tissue as possible. The patient has ample breasts and should tolerate a wide lumpectomy. The patient is in agreement with this plan. We will start her on doxycycline tonight and also gave her prescription for Percocet. I will contact her after the ultrasound tomorrow morning.       Ichiro Chesnut K. 09/14/2011, 5:02 PM

## 2011-09-15 ENCOUNTER — Ambulatory Visit
Admission: RE | Admit: 2011-09-15 | Discharge: 2011-09-15 | Disposition: A | Discharge status: Home / Self Care | Payer: Medicaid Other | Source: Ambulatory Visit | Attending: Surgery | Admitting: Surgery

## 2011-09-15 ENCOUNTER — Encounter (HOSPITAL_COMMUNITY): Payer: Self-pay | Admitting: Pharmacy Technician

## 2011-09-15 ENCOUNTER — Encounter (HOSPITAL_COMMUNITY): Payer: Self-pay | Admitting: *Deleted

## 2011-09-15 DIAGNOSIS — N611 Abscess of the breast and nipple: Secondary | ICD-10-CM

## 2011-09-15 MED ORDER — DEXTROSE 5 % IV SOLN
3.0000 g | INTRAVENOUS | Status: AC
  Administered 2011-09-16: 3 g via INTRAVENOUS
  Filled 2011-09-15: qty 3000

## 2011-09-15 NOTE — Progress Notes (Signed)
Pt said that she had a stress test at [facility] last year.  I faxed a request to them for this information.

## 2011-09-16 ENCOUNTER — Ambulatory Visit (HOSPITAL_COMMUNITY): Payer: Medicaid Other | Admitting: Anesthesiology

## 2011-09-16 ENCOUNTER — Encounter (HOSPITAL_COMMUNITY)
Admission: RE | Disposition: A | Discharge status: Home / Self Care | Payer: Self-pay | Source: Ambulatory Visit | Attending: Surgery

## 2011-09-16 ENCOUNTER — Ambulatory Visit (HOSPITAL_COMMUNITY)
Admission: RE | Admit: 2011-09-16 | Discharge: 2011-09-17 | Disposition: A | Discharge status: Home / Self Care | Payer: Medicaid Other | Source: Ambulatory Visit | Attending: Surgery | Admitting: Surgery

## 2011-09-16 ENCOUNTER — Encounter (HOSPITAL_COMMUNITY): Payer: Self-pay | Admitting: Anesthesiology

## 2011-09-16 ENCOUNTER — Ambulatory Visit (HOSPITAL_COMMUNITY): Payer: Medicaid Other

## 2011-09-16 DIAGNOSIS — N61 Mastitis without abscess: Secondary | ICD-10-CM

## 2011-09-16 DIAGNOSIS — I10 Essential (primary) hypertension: Secondary | ICD-10-CM | POA: Insufficient documentation

## 2011-09-16 DIAGNOSIS — F3130 Bipolar disorder, current episode depressed, mild or moderate severity, unspecified: Secondary | ICD-10-CM | POA: Insufficient documentation

## 2011-09-16 DIAGNOSIS — J45909 Unspecified asthma, uncomplicated: Secondary | ICD-10-CM | POA: Insufficient documentation

## 2011-09-16 DIAGNOSIS — IMO0001 Reserved for inherently not codable concepts without codable children: Secondary | ICD-10-CM | POA: Insufficient documentation

## 2011-09-16 DIAGNOSIS — E119 Type 2 diabetes mellitus without complications: Secondary | ICD-10-CM | POA: Insufficient documentation

## 2011-09-16 DIAGNOSIS — D573 Sickle-cell trait: Secondary | ICD-10-CM | POA: Insufficient documentation

## 2011-09-16 DIAGNOSIS — N611 Abscess of the breast and nipple: Secondary | ICD-10-CM

## 2011-09-16 DIAGNOSIS — F430 Acute stress reaction: Secondary | ICD-10-CM | POA: Insufficient documentation

## 2011-09-16 LAB — SURGICAL PCR SCREEN
MRSA, PCR: NEGATIVE
Staphylococcus aureus: NEGATIVE

## 2011-09-16 LAB — BASIC METABOLIC PANEL
BUN: 11 mg/dL (ref 6–23)
CO2: 25 mEq/L (ref 19–32)
Chloride: 100 mEq/L (ref 96–112)
Glucose, Bld: 233 mg/dL — ABNORMAL HIGH (ref 70–99)
Potassium: 3.9 mEq/L (ref 3.5–5.1)

## 2011-09-16 LAB — CBC
HCT: 37.9 % (ref 36.0–46.0)
Hemoglobin: 13 g/dL (ref 12.0–15.0)
WBC: 10.2 10*3/uL (ref 4.0–10.5)

## 2011-09-16 LAB — GLUCOSE, CAPILLARY: Glucose-Capillary: 266 mg/dL — ABNORMAL HIGH (ref 70–99)

## 2011-09-16 SURGERY — INCISION AND DRAINAGE, ABSCESS
Anesthesia: General | Site: Breast | Laterality: Right | Wound class: Clean Contaminated

## 2011-09-16 MED ORDER — ONDANSETRON HCL 4 MG/2ML IJ SOLN
INTRAMUSCULAR | Status: DC | PRN
  Administered 2011-09-16: 4 mg via INTRAVENOUS

## 2011-09-16 MED ORDER — HYDROMORPHONE HCL PF 1 MG/ML IJ SOLN
0.2500 mg | INTRAMUSCULAR | Status: DC | PRN
  Administered 2011-09-16 (×5): 0.5 mg via INTRAVENOUS

## 2011-09-16 MED ORDER — BUPIVACAINE-EPINEPHRINE PF 0.25-1:200000 % IJ SOLN
INTRAMUSCULAR | Status: DC | PRN
  Administered 2011-09-16: 20 mL

## 2011-09-16 MED ORDER — SODIUM CHLORIDE 0.9 % IV SOLN
INTRAVENOUS | Status: DC | PRN
  Administered 2011-09-16: 12:00:00 via INTRAVENOUS

## 2011-09-16 MED ORDER — HYDROMORPHONE HCL PF 1 MG/ML IJ SOLN
1.0000 mg | INTRAMUSCULAR | Status: DC | PRN
  Administered 2011-09-16 – 2011-09-17 (×3): 1 mg via INTRAVENOUS
  Filled 2011-09-16 (×2): qty 1

## 2011-09-16 MED ORDER — PROPOFOL 10 MG/ML IV EMUL
INTRAVENOUS | Status: DC | PRN
  Administered 2011-09-16: 200 mg via INTRAVENOUS

## 2011-09-16 MED ORDER — MUPIROCIN 2 % EX OINT
TOPICAL_OINTMENT | Freq: Two times a day (BID) | CUTANEOUS | Status: DC
  Filled 2011-09-16 (×2): qty 22

## 2011-09-16 MED ORDER — INSULIN ASPART 100 UNIT/ML ~~LOC~~ SOLN
4.0000 [IU] | Freq: Three times a day (TID) | SUBCUTANEOUS | Status: DC
  Administered 2011-09-16 – 2011-09-17 (×2): 4 [IU] via SUBCUTANEOUS

## 2011-09-16 MED ORDER — SODIUM CHLORIDE 0.9 % IV SOLN
INTRAVENOUS | Status: DC
  Administered 2011-09-16 – 2011-09-17 (×2): via INTRAVENOUS

## 2011-09-16 MED ORDER — SODIUM CHLORIDE 0.9 % IV SOLN: INTRAVENOUS | Status: DC | PRN

## 2011-09-16 MED ORDER — BUPROPION HCL ER (SR) 150 MG PO TB12
150.0000 mg | ORAL_TABLET | Freq: Every day | ORAL | Status: DC
  Administered 2011-09-17: 150 mg via ORAL
  Filled 2011-09-16: qty 1

## 2011-09-16 MED ORDER — ONDANSETRON HCL 4 MG/2ML IJ SOLN
4.0000 mg | Freq: Four times a day (QID) | INTRAMUSCULAR | Status: DC | PRN
  Administered 2011-09-16: 4 mg via INTRAVENOUS
  Filled 2011-09-16 (×3): qty 2

## 2011-09-16 MED ORDER — ENOXAPARIN SODIUM 40 MG/0.4ML ~~LOC~~ SOLN
40.0000 mg | SUBCUTANEOUS | Status: DC
  Administered 2011-09-16: 40 mg via SUBCUTANEOUS
  Filled 2011-09-16 (×4): qty 0.4

## 2011-09-16 MED ORDER — OXYCODONE-ACETAMINOPHEN 5-325 MG PO TABS
ORAL_TABLET | ORAL | Status: AC
  Filled 2011-09-16: qty 2

## 2011-09-16 MED ORDER — HYDROMORPHONE HCL PF 1 MG/ML IJ SOLN
INTRAMUSCULAR | Status: AC
  Filled 2011-09-16: qty 1

## 2011-09-16 MED ORDER — PAROXETINE HCL ER 37.5 MG PO TB24
37.5000 mg | ORAL_TABLET | ORAL | Status: DC
  Administered 2011-09-17: 37.5 mg via ORAL
  Filled 2011-09-16 (×3): qty 1

## 2011-09-16 MED ORDER — MIDAZOLAM HCL 5 MG/5ML IJ SOLN
INTRAMUSCULAR | Status: DC | PRN
  Administered 2011-09-16: 2 mg via INTRAVENOUS

## 2011-09-16 MED ORDER — CEFAZOLIN SODIUM 1-5 GM-% IV SOLN
1.0000 g | Freq: Three times a day (TID) | INTRAVENOUS | Status: DC
  Administered 2011-09-16 – 2011-09-17 (×2): 1 g via INTRAVENOUS
  Filled 2011-09-16 (×4): qty 50

## 2011-09-16 MED ORDER — PROMETHAZINE HCL 25 MG/ML IJ SOLN
25.0000 mg | Freq: Four times a day (QID) | INTRAMUSCULAR | Status: DC | PRN
  Administered 2011-09-16: 25 mg via INTRAVENOUS
  Filled 2011-09-16: qty 1

## 2011-09-16 MED ORDER — GABAPENTIN 300 MG PO CAPS
300.0000 mg | ORAL_CAPSULE | Freq: Two times a day (BID) | ORAL | Status: DC
  Administered 2011-09-16 – 2011-09-17 (×2): 300 mg via ORAL
  Filled 2011-09-16 (×4): qty 1

## 2011-09-16 MED ORDER — DIVALPROEX SODIUM ER 500 MG PO TB24
500.0000 mg | ORAL_TABLET | Freq: Every day | ORAL | Status: DC
  Administered 2011-09-16: 500 mg via ORAL
  Filled 2011-09-16 (×3): qty 1

## 2011-09-16 MED ORDER — MUPIROCIN 2 % EX OINT
TOPICAL_OINTMENT | CUTANEOUS | Status: AC
  Filled 2011-09-16: qty 22

## 2011-09-16 MED ORDER — EXENATIDE 5 MCG/0.02ML ~~LOC~~ SOPN
5.0000 ug | PEN_INJECTOR | Freq: Two times a day (BID) | SUBCUTANEOUS | Status: DC
  Filled 2011-09-16: qty 1.2

## 2011-09-16 MED ORDER — LISINOPRIL 10 MG PO TABS
10.0000 mg | ORAL_TABLET | Freq: Every day | ORAL | Status: DC
  Administered 2011-09-17: 10 mg via ORAL
  Filled 2011-09-16: qty 1

## 2011-09-16 MED ORDER — ONDANSETRON HCL 4 MG/2ML IJ SOLN: 4.0000 mg | Freq: Four times a day (QID) | INTRAMUSCULAR | Status: DC | PRN

## 2011-09-16 MED ORDER — METFORMIN HCL 500 MG PO TABS
500.0000 mg | ORAL_TABLET | Freq: Two times a day (BID) | ORAL | Status: DC
  Administered 2011-09-16 – 2011-09-17 (×2): 500 mg via ORAL
  Filled 2011-09-16 (×5): qty 1

## 2011-09-16 MED ORDER — BUPIVACAINE-EPINEPHRINE PF 0.25-1:200000 % IJ SOLN
INTRAMUSCULAR | Status: AC
  Filled 2011-09-16: qty 30

## 2011-09-16 MED ORDER — OXYCODONE-ACETAMINOPHEN 5-325 MG PO TABS
1.0000 | ORAL_TABLET | ORAL | Status: DC | PRN
  Administered 2011-09-16: 2 via ORAL

## 2011-09-16 MED ORDER — LIDOCAINE HCL (CARDIAC) 20 MG/ML IV SOLN
INTRAVENOUS | Status: DC | PRN
  Administered 2011-09-16: 50 mg via INTRAVENOUS

## 2011-09-16 MED ORDER — CHLORHEXIDINE GLUCONATE 4 % EX LIQD: 1.0000 "application " | Freq: Once | CUTANEOUS | Status: DC

## 2011-09-16 MED ORDER — INSULIN ASPART 100 UNIT/ML ~~LOC~~ SOLN
0.0000 [IU] | Freq: Three times a day (TID) | SUBCUTANEOUS | Status: DC
  Administered 2011-09-16 – 2011-09-17 (×2): 3 [IU] via SUBCUTANEOUS

## 2011-09-16 MED ORDER — ONDANSETRON HCL 4 MG PO TABS
4.0000 mg | ORAL_TABLET | Freq: Four times a day (QID) | ORAL | Status: DC | PRN
  Administered 2011-09-17: 4 mg via ORAL
  Filled 2011-09-16: qty 1

## 2011-09-16 MED ORDER — HYDROMORPHONE HCL PF 1 MG/ML IJ SOLN
0.5000 mg | INTRAMUSCULAR | Status: DC | PRN
  Administered 2011-09-16 (×2): 0.5 mg via INTRAVENOUS

## 2011-09-16 MED ORDER — FENTANYL CITRATE 0.05 MG/ML IJ SOLN
INTRAMUSCULAR | Status: DC | PRN
  Administered 2011-09-16: 100 ug via INTRAVENOUS

## 2011-09-16 MED ORDER — DARIFENACIN HYDROBROMIDE ER 15 MG PO TB24
15.0000 mg | ORAL_TABLET | Freq: Every day | ORAL | Status: DC
  Administered 2011-09-16 – 2011-09-17 (×2): 15 mg via ORAL
  Filled 2011-09-16 (×3): qty 1

## 2011-09-16 SURGICAL SUPPLY — 46 items
ADH SKN CLS APL DERMABOND .7 (GAUZE/BANDAGES/DRESSINGS)
BANDAGE GAUZE ELAST BULKY 4 IN (GAUZE/BANDAGES/DRESSINGS) ×1 IMPLANT
BLADE SURG ROTATE 9660 (MISCELLANEOUS) IMPLANT
BRR ADH 6X5 SEPRAFILM 1 SHT (MISCELLANEOUS)
CANISTER SUCTION 2500CC (MISCELLANEOUS) ×2 IMPLANT
CLOTH BEACON ORANGE TIMEOUT ST (SAFETY) ×2 IMPLANT
COVER SURGICAL LIGHT HANDLE (MISCELLANEOUS) ×2 IMPLANT
DECANTER SPIKE VIAL GLASS SM (MISCELLANEOUS) ×2 IMPLANT
DERMABOND ADVANCED (GAUZE/BANDAGES/DRESSINGS)
DERMABOND ADVANCED .7 DNX12 (GAUZE/BANDAGES/DRESSINGS) IMPLANT
DEVICE SECURE STRAP 25 ABSORB (INSTRUMENTS) IMPLANT
DRAIN CHANNEL 19F RND (DRAIN) IMPLANT
DRAPE LAPAROTOMY T 102X78X121 (DRAPES) ×2 IMPLANT
DRAPE UTILITY 15X26 W/TAPE STR (DRAPE) ×4 IMPLANT
ELECT REM PT RETURN 9FT ADLT (ELECTROSURGICAL) ×2
ELECTRODE REM PT RTRN 9FT ADLT (ELECTROSURGICAL) ×1 IMPLANT
EVACUATOR SILICONE 100CC (DRAIN) IMPLANT
GLOVE EUDERMIC 7 POWDERFREE (GLOVE) ×2 IMPLANT
GOWN STRL NON-REIN LRG LVL3 (GOWN DISPOSABLE) ×4 IMPLANT
KIT BASIN OR (CUSTOM PROCEDURE TRAY) ×2 IMPLANT
KIT ROOM TURNOVER OR (KITS) ×2 IMPLANT
NEEDLE 27GAX1X1/2 (NEEDLE) IMPLANT
NS IRRIG 1000ML POUR BTL (IV SOLUTION) ×2 IMPLANT
PACK GENERAL/GYN (CUSTOM PROCEDURE TRAY) ×2 IMPLANT
PAD ARMBOARD 7.5X6 YLW CONV (MISCELLANEOUS) ×4 IMPLANT
SEPRAFILM MEMBRANE 5X6 (MISCELLANEOUS) IMPLANT
SPECIMEN JAR SMALL (MISCELLANEOUS) ×2 IMPLANT
SPONGE GAUZE 4X4 12PLY (GAUZE/BANDAGES/DRESSINGS) ×2 IMPLANT
SPONGE LAP 18X18 X RAY DECT (DISPOSABLE) IMPLANT
STAPLER VISISTAT 35W (STAPLE) IMPLANT
STRIP CLOSURE SKIN 1/2X4 (GAUZE/BANDAGES/DRESSINGS) IMPLANT
SUT ETHILON 3 0 FSL (SUTURE) IMPLANT
SUT MNCRL AB 4-0 PS2 18 (SUTURE) IMPLANT
SUT NOV 1 T60/GS (SUTURE) IMPLANT
SUT NOVA 0 T19/GS 22DT (SUTURE) IMPLANT
SUT NOVA NAB DX-16 0-1 5-0 T12 (SUTURE) IMPLANT
SUT SILK 2 0 (SUTURE)
SUT SILK 2-0 18XBRD TIE 12 (SUTURE) IMPLANT
SUT VIC AB 2-0 CT1 27 (SUTURE) ×4
SUT VIC AB 2-0 CT1 TAPERPNT 27 (SUTURE) ×2 IMPLANT
SYR CONTROL 10ML LL (SYRINGE) IMPLANT
TAPE CLOTH SURG 6X10 WHT LF (GAUZE/BANDAGES/DRESSINGS) ×1 IMPLANT
TOWEL OR 17X24 6PK STRL BLUE (TOWEL DISPOSABLE) ×2 IMPLANT
TOWEL OR 17X26 10 PK STRL BLUE (TOWEL DISPOSABLE) ×2 IMPLANT
TRAY FOLEY CATH 14FRSI W/METER (CATHETERS) IMPLANT
WATER STERILE IRR 1000ML POUR (IV SOLUTION) ×2 IMPLANT

## 2011-09-16 NOTE — Transfer of Care (Signed)
Immediate Anesthesia Transfer of Care Note  Patient: Cassidy [name]  Procedure(s) Performed: Procedure(s) (LRB) with comments: INCISION AND DRAINAGE ABSCESS (Right)  Patient Location: PACU  Anesthesia Type: General  Level of Consciousness: awake, alert  and oriented  Airway & Oxygen Therapy: Patient Spontanous Breathing  Post-op Assessment: Report given to PACU RN and Post -op Vital signs reviewed and stable  Post vital signs: Reviewed  Complications: No apparent anesthesia complications

## 2011-09-16 NOTE — Anesthesia Postprocedure Evaluation (Signed)
Anesthesia Post Note  Patient: Cassidy [name]  Procedure(s) Performed: Procedure(s) (LRB): INCISION AND DRAINAGE ABSCESS (Right)  Anesthesia type: General  Patient location: PACU  Post pain: Pain level controlled and Adequate analgesia  Post assessment: Post-op Vital signs reviewed, Patient's Cardiovascular Status Stable, Respiratory Function Stable, Patent Airway and Pain level controlled  Last Vitals:  Filed Vitals:   09/16/11 1350  BP:   Pulse:   Temp: 36.7 C  Resp:     Post vital signs: Reviewed and stable  Level of consciousness: awake, alert  and oriented  Complications: No apparent anesthesia complications

## 2011-09-16 NOTE — Interval H&P Note (Signed)
History and Physical Interval Note:  09/16/2011 11:53 AM  Cassidy [name]  has presented today for surgery, with the diagnosis of Right breast abcess  The various methods of treatment have been discussed with the patient and family. After consideration of risks, benefits and other options for treatment, the patient has consented to  Procedure(s) (LRB) with comments: INCISION AND DRAINAGE ABSCESS (Right) as a surgical intervention .  The patient's history has been reviewed, patient examined, no change in status, stable for surgery.  I have reviewed the patient's chart and labs.  Questions were answered to the patient's satisfaction.     Marlyce Mcdougald K.

## 2011-09-16 NOTE — Anesthesia Preprocedure Evaluation (Signed)
Anesthesia Evaluation  Patient identified by MRN, date of birth, ID band Patient awake    Reviewed: Allergy & Precautions, H&P , NPO status , Patient's Chart, lab work & pertinent test results  Airway Mallampati: II  Neck ROM: full    Dental   Pulmonary asthma ,          Cardiovascular hypertension,     Neuro/Psych Depression Bipolar Disorder    GI/Hepatic GERD-  ,  Endo/Other  diabetes, Type 2obese  Renal/GU      Musculoskeletal  (+) Fibromyalgia -  Abdominal   Peds  Hematology  (+) Blood dyscrasia, Sickle cell trait ,   Anesthesia Other Findings   Reproductive/Obstetrics                           Anesthesia Physical Anesthesia Plan  ASA: II  Anesthesia Plan: General   Post-op Pain Management:    Induction: Intravenous  Airway Management Planned: LMA  Additional Equipment:   Intra-op Plan:   Post-operative Plan:   Informed Consent: I have reviewed the patients History and Physical, chart, labs and discussed the procedure including the risks, benefits and alternatives for the proposed anesthesia with the patient or authorized representative who has indicated his/her understanding and acceptance.     Plan Discussed with: CRNA and Surgeon  Anesthesia Plan Comments:         Anesthesia Quick Evaluation

## 2011-09-16 NOTE — Anesthesia Procedure Notes (Signed)
Procedure Name: LMA Insertion Date/Time: 09/16/2011 11:17 AM Performed by: Marena Chancy Pre-anesthesia Checklist: Patient identified, Timeout performed, Emergency Drugs available, Suction available and Patient being monitored Patient Re-evaluated:Patient Re-evaluated prior to inductionOxygen Delivery Method: Circle system utilized Preoxygenation: Pre-oxygenation with 100% oxygen Intubation Type: IV induction LMA: LMA inserted LMA Size: 4.0 Number of attempts: 1 Placement Confirmation: positive ETCO2 and breath sounds checked- equal and bilateral Tube secured with: Tape Dental Injury: Teeth and Oropharynx as per pre-operative assessment

## 2011-09-16 NOTE — Op Note (Signed)
Preop diagnosis: Recurrent right breast abscess Postop diagnosis: Same Procedure performed: Incision and drainage of recurrent right breast abscess Surgeon:Phelan Goers K. Anesthesia: Gen.  Indications: This is a 46 year old female who has multiple medical issues who presents with recurrent right breast abscess. She has had multiple surgeries for this. Ultrasound confirmed a the abscess below the skin with a sinus tract leading to her previous incision site. She presents now for surgical drainage.  Description of procedure: The patient was brought to the operating room placed supine position operative table. After adequate level of general anesthesia was obtained her right breast was prepped with chlor prep and draped sterile fashion. A timeout was taken to ensure the proper patient proper procedure. We open up her previous incision and dissected down into a large abscess cavity. We cultured the purulent fluid that was coming out abscess. We open the wound widely. The abscess seems to be about 3 cm across. I excised all the wall of the abscess. There some thickened ration around the abscess. We inspected carefully for hemostasis. The irrigated thoroughly. No further purulence was noted. We packed with saline soaked gauze. Dry dressing was applied. We infiltrated quarter percent Marcaine with epinephrine around the wound. The patient's extubated but recovery stable condition. All sponge needle counts are correct.  Wilmon Arms. Corliss Skains, MD, [facility]  09/16/2011 1:49 PM

## 2011-09-16 NOTE — Preoperative (Signed)
Beta Blockers   Reason not to administer Beta Blockers:Not Applicable 

## 2011-09-16 NOTE — H&P (View-Only) (Signed)
Patient ID: Cassidy [name], female   DOB: 10/20/1965, 46 y.o.   MRN: [id number]  Chief Complaint  Patient presents with  . Other    urge off/i&D br abs    HPI Cassidy [name] is a 46 y.o. female.   HPI Urgent office on 09/14/11. The patient comes in for recheck of her right breast abscess. The patient has had multiple surgeries to drain this breast abscess. It continues to recur. He has become quite tender and painful in the upper part of her breast. Her last surgery was a month ago by Dr. Wyatt. It is unclear why she continues to have these recurrent abscesses. She has not had any recent imaging of her breast.      Past Medical History  Diagnosis Date  . Hypertension   . Depression   . GERD (gastroesophageal reflux disease)   . Breast abscess     recurrent; on right  . High cholesterol   . Type II diabetes mellitus   . Anemia   . Sickle cell trait   . Fibromyalgia   . Bipolar depression   . PTSD (post-traumatic stress disorder)   . Genital warts     Past Surgical History  Procedure Date  . Bladder surgery 1980  . Irrigation and debridement abscess 12/19/2010    Procedure: IRRIGATION AND DEBRIDEMENT ABSCESS;  Surgeon: Aadhira Heffernan Wakefield, MD;  Location: [facility] OR;  Service: General;  Laterality: Right;  . Incise and drain abcess ; 04/26/11; 08/13/11    right breast  . Breast surgery     I&D abscess; right nipple area  . Tonsillectomy 1988  . Eye surgery   . Refractive surgery ~ 2010    right  . Irrigation and debridement abscess 08/13/2011    Procedure: MINOR INCISION AND DRAINAGE OF ABSCESS;  Surgeon: James O Wyatt, MD;  Location: [facility] OR;  Service: General;  Laterality: Right;  Right Breast     Family History  Problem Relation Age of Onset  . COPD Mother   . Cancer Mother     lymphoma  . Cancer Maternal Aunt     breast, colon    Social History History  Substance Use Topics  . Smoking status: Current Everyday Smoker -- 0.2 packs/day for 30 years    Types: Cigarettes    . Smokeless tobacco: Never Used   Comment: 08/14/11 "quit for 3 wk 05/2011; was smoking 1 ppd since age 14 before I quit; at least I've cut down to 1/4 ppd"  . 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 buPROPion (WELLBUTRIN SR) 150 MG 12 hr tablet Take 150 mg by mouth daily.        . darifenacin (ENABLEX) 15 MG 24 hr tablet Take 15 mg by mouth daily.      . divalproex (DEPAKOTE ER) 500 MG 24 hr tablet Take 500 mg by mouth at bedtime.       . exenatide (BYETTA) 5 MCG/0.02ML SOLN Inject 5 mcg into the skin 2 (two) times daily with a meal.        . lisinopril (PRINIVIL,ZESTRIL) 10 MG tablet Take 10 mg by mouth daily.      . metFORMIN (GLUCOPHAGE) 500 MG tablet Take 500 mg by mouth 2 (two) times daily with a meal.        . PARoxetine (PAXIL-CR) 37.5 MG 24 hr   tablet Take 37.5 mg by mouth every morning.        . Polyethyl Glycol-Propyl Glycol (SYSTANE) 0.4-0.3 % SOLN Apply 2 drops to eye daily as needed. For dry eyes      . sulfamethoxazole-trimethoprim (BACTRIM DS) 800-160 MG per tablet Take 1 tablet by mouth 2 (two) times daily. X 10 days. Started on 08/09/11.      . doxycycline (VIBRAMYCIN) 100 MG capsule Take 1 capsule (100 mg total) by mouth 2 (two) times daily.  28 capsule  0  . oxyCODONE-acetaminophen (PERCOCET/ROXICET) 5-325 MG per tablet Take 1 tablet by mouth every 4 (four) hours as needed for pain.  40 tablet  0    Review of Systems Review of Systems  Blood pressure 120/72, pulse 94, temperature 97.6 F (36.4 C), temperature source Temporal, height 5' 7.5" (1.715 m), weight 239 lb 3.2 oz (108.5 kg), SpO2 96.00%.  Physical Exam Physical Exam Her right breast shows that all of the incisions are well healed around her areola. The upper part of her areola is contracted. This area is erythematous and very tender. To deep palpation the mass seems to  be about 3 cm across.  Data Reviewed   Assessment    Impression: Recurrent breast abscess, right-unclear etiology     Plan     Plan: Will obtain a right breast ultrasound to determine the size of the abscess cavity. Later this week we will need to take her to the operating room for a wide debridement and drainage of the abscess. We will try to debride as much of the necrotic infected tissue as possible. The patient has ample breasts and should tolerate a wide lumpectomy. The patient is in agreement with this plan. We will start her on doxycycline tonight and also gave her prescription for Percocet. I will contact her after the ultrasound tomorrow morning.       Kellina Dreese K. 09/14/2011, 5:02 PM    

## 2011-09-16 NOTE — Progress Notes (Signed)
Stress test was not received.

## 2011-09-17 LAB — HEMOGLOBIN A1C: Hgb A1c MFr Bld: 10.5 % — ABNORMAL HIGH (ref <5.7)

## 2011-09-17 MED ORDER — OXYCODONE-ACETAMINOPHEN 5-325 MG PO TABS
1.0000 | ORAL_TABLET | ORAL | Status: DC | PRN
Start: 2011-09-17 — End: 2011-09-23

## 2011-09-17 MED FILL — Mupirocin Oint 2%: CUTANEOUS | Qty: 22 | Status: AC

## 2011-09-17 NOTE — Progress Notes (Signed)
1 Day Post-Op  Subjective: Feels much better; still sore  Objective: Vital signs in last 24 hours: Temp:  [97.7 F (36.5 C)-98.4 F (36.9 C)] 98.4 F (36.9 C) (09/06 0606) Pulse Rate:  [71-95] 83  (09/06 0606) Resp:  [11-22] 20  (09/06 0606) BP: (110-147)/(49-110) 112/58 mmHg (09/06 0606) SpO2:  [94 %-100 %] 97 % (09/06 0606)    Intake/Output from previous day: 09/05 0701 - 09/06 0700 In: 980 [P.O.:580; I.V.:400] Out: 2 [Urine:2] Intake/Output this shift:    Dressing changed - minimal drainage; mild oozing; repacked with saline-moistened gauze  Lab Results:   Basename 09/16/11 1003  WBC 10.2  HGB 13.0  HCT 37.9  PLT 334   BMET  Basename 09/16/11 1002  NA 135  K 3.9  CL 100  CO2 25  GLUCOSE 233*  BUN 11  CREATININE 0.56  CALCIUM 9.5   PT/INR No results found for this basename: LABPROT:2,INR:2 in the last 72 hours ABG No results found for this basename: PHART:2,PCO2:2,PO2:2,HCO3:2 in the last 72 hours  Studies/Results: Dg Chest 2 View  09/16/2011  *RADIOLOGY REPORT*  Clinical Data: Preoperative examination for patient with a right breast abscess.  CHEST - 2 VIEW  Comparison: Single view of the chest 04/26/2011.  Findings: Mild linear atelectasis is seen in the lingula.  Lungs otherwise clear.  No pneumothorax or pleural fluid.  Heart size normal.  IMPRESSION: Mild lingular atelectasis.  Otherwise negative.   Original Report Authenticated By: Bernadene Bell. Maricela Curet, M.D.    US Breast Right  09/15/2011  *RADIOLOGY REPORT*  Clinical Data:  The patient has had a chronic right subareolar abscess since November 2012.  Surgical drainage is planned and ultrasound is requested to assess for size of the abscess.  RIGHT BREAST ULTRASOUND  Comparison:  12/08/2010, 11/25/2008  On physical exam, there is erythema in the upper outer periareolar region.  There is palpable fullness beneath the right nipple.  Findings: Ultrasound is performed, showing a complex right subareolar fluid  collection measuring at least 2.2 x 3.2 x 1.6 cm. There is a tract leading to the scar in the upper outer periareolar region.  IMPRESSION: Right breast subareolar abscess measuring at least 2.2 x 3.2 x 1.6 cm.  RECOMMENDATION: Suggest clinical follow-up.  When the patient can tolerate mammography after treatment for the abscess, yearly screening would be suggested given the history of maternal breast cancer.  The patient would be due in November 2013.  BI-RADS CATEGORY 2:  Benign finding(s).   Original Report Authenticated By: Daryl Eastern, M.D.     Anti-infectives: Anti-infectives     Start     Dose/Rate Route Frequency Ordered Stop   09/16/11 2000   ceFAZolin (ANCEF) IVPB 1 g/50 mL premix        1 g 100 mL/hr over 30 Minutes Intravenous Every 8 hours 09/16/11 1700     09/15/11 1930   ceFAZolin (ANCEF) 3 g in dextrose 5 % 50 mL IVPB        3 g 160 mL/hr over 30 Minutes Intravenous 60 min pre-op 09/15/11 1930 09/16/11 1302          Assessment/Plan: s/p Procedure(s) (LRB) with comments: INCISION AND DRAINAGE ABSCESS (Right) Discharge Home health for daily dressing changes Follow-up 1 week   LOS: 1 day    Deloras Reichard K. 09/17/2011

## 2011-09-17 NOTE — Discharge Summary (Signed)
Physician Discharge Summary  Patient ID: [name] MRN: [id number] DOB/AGE: 1965/11/18 46 y.o.  Admit date: 09/16/2011 Discharge date: 09/17/2011  Admission Diagnoses:  Recurrent right breast abscess  Discharge Diagnoses: Recurrent right breast abscess Active Problems:  * No active hospital problems. *    Discharged Condition: good  Hospital Course: Incision and drainage of right breast abscess  Consults: None  Significant Diagnostic Studies: none  Treatments: surgery: I&D right breast  Discharge Exam: Blood pressure 112/58, pulse 83, temperature 98.4 F (36.9 C), temperature source Oral, resp. rate 20, last menstrual period 09/06/2011, SpO2 97.00%. Wound - clean, minimal drainage  Disposition: 01-Home or Self Care   Medication List  As of 09/17/2011  8:44 AM   ASK your doctor about these medications         buPROPion 150 MG 12 hr tablet   Commonly known as: WELLBUTRIN SR   Take 150 mg by mouth daily.      divalproex 500 MG 24 hr tablet   Commonly known as: DEPAKOTE ER   Take 500 mg by mouth at bedtime.      doxycycline 100 MG capsule   Commonly known as: VIBRAMYCIN   Take 100 mg by mouth 2 (two) times daily.      ENABLEX 15 MG 24 hr tablet   Generic drug: darifenacin   Take 15 mg by mouth daily.      exenatide 5 MCG/0.02ML Soln   Commonly known as: BYETTA   Inject 5 mcg into the skin 2 (two) times daily with a meal.      gabapentin 300 MG capsule   Commonly known as: NEURONTIN   Take 300 mg by mouth 2 (two) times daily.      lisinopril 10 MG tablet   Commonly known as: PRINIVIL,ZESTRIL   Take 10 mg by mouth daily.      metFORMIN 500 MG tablet   Commonly known as: GLUCOPHAGE   Take 500 mg by mouth 2 (two) times daily with a meal.      oxyCODONE-acetaminophen 5-325 MG per tablet   Commonly known as: PERCOCET/ROXICET   Take 1 tablet by mouth every 4 (four) hours as needed. For pain      PARoxetine 37.5 MG 24 hr tablet   Commonly known as:  PAXIL-CR   Take 37.5 mg by mouth every morning.      SYSTANE 0.4-0.3 % Soln   Generic drug: Polyethyl Glycol-Propyl Glycol   Apply 2 drops to eye daily as needed. For dry eyes           Follow-up Information    Follow up with Wynona Luna., MD. Schedule an appointment as soon as possible for a visit in 1 week.   Contact information:   [address] [place] [address] [phone]          Signed: Wynona Luna. 09/17/2011, 8:44 AM

## 2011-09-17 NOTE — Progress Notes (Signed)
Pt discharged to home. Discharge instructions explained pt verbalized understanding. IV d/c'd 

## 2011-09-21 LAB — ANAEROBIC CULTURE

## 2011-09-23 ENCOUNTER — Ambulatory Visit (INDEPENDENT_AMBULATORY_CARE_PROVIDER_SITE_OTHER): Payer: Medicaid Other | Admitting: Surgery

## 2011-09-23 ENCOUNTER — Encounter (INDEPENDENT_AMBULATORY_CARE_PROVIDER_SITE_OTHER): Payer: Self-pay | Admitting: Surgery

## 2011-09-23 VITALS — BP 117/80 | HR 78 | Temp 98.6°F | Resp 18 | Ht 67.5 in | Wt 239.0 lb

## 2011-09-23 DIAGNOSIS — N61 Mastitis without abscess: Secondary | ICD-10-CM

## 2011-09-23 DIAGNOSIS — N611 Abscess of the breast and nipple: Secondary | ICD-10-CM

## 2011-09-23 MED ORDER — HYDROCODONE-ACETAMINOPHEN 5-325 MG PO TABS: 1.0000 | ORAL_TABLET | ORAL | Status: AC | PRN

## 2011-09-23 NOTE — Progress Notes (Signed)
Status post incision and drainage of a recurrent right breast abscess on 09/16/11. This point the cultures have been negative. Patient still having some tenderness but overall the pain is much improved. The wound is clean and beginning to granulate. No surrounding cellulitis. The surrounding induration has resolved.  Continue with daily wet-to-dry dressings. I gave her prescription for Vicodin at her request. She gave me back the prescription for Percocet. She will continue dressing changes and will followup in 2 weeks.  Wilmon Arms. Corliss Skains, MD, [facility]  09/23/2011 2:38 PM

## 2011-10-05 ENCOUNTER — Ambulatory Visit (INDEPENDENT_AMBULATORY_CARE_PROVIDER_SITE_OTHER): Payer: Medicaid Other | Admitting: Surgery

## 2011-10-05 ENCOUNTER — Encounter (INDEPENDENT_AMBULATORY_CARE_PROVIDER_SITE_OTHER): Payer: Self-pay | Admitting: Surgery

## 2011-10-05 VITALS — BP 122/82 | HR 90 | Temp 99.0°F | Resp 24 | Ht 67.5 in | Wt 237.4 lb

## 2011-10-05 DIAGNOSIS — N61 Mastitis without abscess: Secondary | ICD-10-CM

## 2011-10-05 DIAGNOSIS — N611 Abscess of the breast and nipple: Secondary | ICD-10-CM

## 2011-10-05 NOTE — Progress Notes (Signed)
The patient is here for recheck of her right breast abscess. The wound is healing very nicely. No surrounding cellulitis or induration. The wound is well granulated with minimal yellowish drainage. Continue wet-to-dry dressings.  Recheck 3 weeks.  Wilmon Arms. Corliss Skains, MD, [facility]  10/05/2011 4:05 PM

## 2011-10-18 ENCOUNTER — Telehealth (INDEPENDENT_AMBULATORY_CARE_PROVIDER_SITE_OTHER): Payer: Self-pay | Admitting: General Surgery

## 2011-10-18 NOTE — Telephone Encounter (Signed)
Message copied by Wilder Glade on Mon Oct 18, 2011  3:49 PM ------      Message from: Rise Paganini      Created: Mon Oct 18, 2011  3:33 PM      Regarding: Tsuei      Contact: [phone]       It is time for patient to schedule her mammogram and she would like to know how to go about this because her incision hasn't healed. Please call to discuss. Thank you.

## 2011-10-18 NOTE — Telephone Encounter (Signed)
Called pt back and told her no on the [facility] because of the incision still not healed. I also told the pt that we will check her breast when she come back in on the 10-26-2011 to see Dr Corliss Skains and go from there about the [facility]

## 2011-10-26 ENCOUNTER — Ambulatory Visit (INDEPENDENT_AMBULATORY_CARE_PROVIDER_SITE_OTHER): Payer: Medicaid Other | Admitting: Surgery

## 2011-10-26 ENCOUNTER — Encounter (INDEPENDENT_AMBULATORY_CARE_PROVIDER_SITE_OTHER): Payer: Self-pay | Admitting: Surgery

## 2011-10-26 VITALS — BP 128/82 | HR 84 | Temp 97.0°F | Ht 68.0 in | Wt 242.8 lb

## 2011-10-26 DIAGNOSIS — N61 Mastitis without abscess: Secondary | ICD-10-CM

## 2011-10-26 DIAGNOSIS — N611 Abscess of the breast and nipple: Secondary | ICD-10-CM

## 2011-10-26 NOTE — Progress Notes (Signed)
She is here for recheck of her right breast abscess. It is almost completely healed. It is only about a half centimeter deep and a half centimeter wide. This is well granulated. No surrounding cellulitis or inflammation. She should continue packing a small amount of gauze and this until the wound is completely flush with the skin. Then she may switch to Neosporin. She will call us if she needs a recheck.  Wilmon Arms. Corliss Skains, MD, [facility]  10/26/2011 11:01 AM

## 2011-11-16 ENCOUNTER — Encounter (INDEPENDENT_AMBULATORY_CARE_PROVIDER_SITE_OTHER): Payer: Self-pay | Admitting: General Surgery

## 2011-11-16 ENCOUNTER — Ambulatory Visit (INDEPENDENT_AMBULATORY_CARE_PROVIDER_SITE_OTHER): Payer: Medicaid Other | Admitting: General Surgery

## 2011-11-16 VITALS — BP 139/86 | HR 84 | Temp 98.6°F | Resp 18 | Ht 67.5 in | Wt 247.6 lb

## 2011-11-16 DIAGNOSIS — N63 Unspecified lump in unspecified breast: Secondary | ICD-10-CM

## 2011-11-16 NOTE — Progress Notes (Signed)
Patient ID: Cassidy [name], female   DOB: 1965/01/27, 46 y.o.   MRN: [id number] The patient is a 46 year old female patient of Dr. Corliss Skains  removed is undergoing incision and drainage of her right breast abscess. Patient returns today with a several day history of inflammation in this area were space. Patient is she's had no fevers at home has had no drainage in the area that had no erythema.  On exam: There is approximately 2 and 3 cm area of inflammation deep to the nipple. This area was difficult to the ultrasound within the clinic.  Assessment and plan: 1. Patient return to clinic to visit with Dr. Corliss Skains skin incision and drainage and likely biopsy of her right abscess. 2. I prescribe Percocet to help with pain at this time.

## 2011-11-17 ENCOUNTER — Encounter (HOSPITAL_COMMUNITY)
Admission: AD | Disposition: A | Discharge status: Home / Self Care | Payer: Self-pay | Source: Ambulatory Visit | Attending: Surgery

## 2011-11-17 ENCOUNTER — Encounter (HOSPITAL_COMMUNITY): Payer: Self-pay | Admitting: Anesthesiology

## 2011-11-17 ENCOUNTER — Observation Stay (HOSPITAL_COMMUNITY)
Admission: AD | Admit: 2011-11-17 | Discharge: 2011-11-18 | Disposition: A | Discharge status: Home / Self Care | Payer: Medicaid Other | Source: Ambulatory Visit | Attending: Surgery | Admitting: Surgery

## 2011-11-17 ENCOUNTER — Encounter (HOSPITAL_COMMUNITY): Payer: Self-pay | Admitting: *Deleted

## 2011-11-17 ENCOUNTER — Encounter (INDEPENDENT_AMBULATORY_CARE_PROVIDER_SITE_OTHER): Payer: Self-pay | Admitting: Surgery

## 2011-11-17 ENCOUNTER — Ambulatory Visit (INDEPENDENT_AMBULATORY_CARE_PROVIDER_SITE_OTHER): Payer: Medicaid Other | Admitting: Surgery

## 2011-11-17 ENCOUNTER — Observation Stay (HOSPITAL_COMMUNITY): Payer: Medicaid Other | Admitting: Anesthesiology

## 2011-11-17 VITALS — BP 124/74 | HR 102 | Temp 97.2°F | Ht 67.5 in | Wt 246.6 lb

## 2011-11-17 DIAGNOSIS — N61 Mastitis without abscess: Secondary | ICD-10-CM

## 2011-11-17 DIAGNOSIS — E119 Type 2 diabetes mellitus without complications: Secondary | ICD-10-CM | POA: Insufficient documentation

## 2011-11-17 DIAGNOSIS — F319 Bipolar disorder, unspecified: Secondary | ICD-10-CM | POA: Insufficient documentation

## 2011-11-17 DIAGNOSIS — I10 Essential (primary) hypertension: Secondary | ICD-10-CM | POA: Insufficient documentation

## 2011-11-17 DIAGNOSIS — K219 Gastro-esophageal reflux disease without esophagitis: Secondary | ICD-10-CM | POA: Insufficient documentation

## 2011-11-17 DIAGNOSIS — N611 Abscess of the breast and nipple: Secondary | ICD-10-CM

## 2011-11-17 DIAGNOSIS — IMO0001 Reserved for inherently not codable concepts without codable children: Secondary | ICD-10-CM | POA: Insufficient documentation

## 2011-11-17 DIAGNOSIS — J45909 Unspecified asthma, uncomplicated: Secondary | ICD-10-CM | POA: Insufficient documentation

## 2011-11-17 DIAGNOSIS — E669 Obesity, unspecified: Secondary | ICD-10-CM | POA: Insufficient documentation

## 2011-11-17 HISTORY — DX: Type 2 diabetes mellitus with diabetic polyneuropathy: E11.42

## 2011-11-17 HISTORY — PX: IRRIGATION AND DEBRIDEMENT ABSCESS: SHX5252

## 2011-11-17 LAB — CBC
HCT: 36 % (ref 36.0–46.0)
MCHC: 35.6 g/dL (ref 30.0–36.0)
MCV: 82.8 fL (ref 78.0–100.0)
RDW: 12.9 % (ref 11.5–15.5)
WBC: 10.6 10*3/uL — ABNORMAL HIGH (ref 4.0–10.5)

## 2011-11-17 LAB — GLUCOSE, CAPILLARY: Glucose-Capillary: 151 mg/dL — ABNORMAL HIGH (ref 70–99)

## 2011-11-17 LAB — BASIC METABOLIC PANEL
BUN: 9 mg/dL (ref 6–23)
CO2: 26 mEq/L (ref 19–32)
Chloride: 100 mEq/L (ref 96–112)
Creatinine, Ser: 0.55 mg/dL (ref 0.50–1.10)
Glucose, Bld: 231 mg/dL — ABNORMAL HIGH (ref 70–99)

## 2011-11-17 SURGERY — IRRIGATION AND DEBRIDEMENT ABSCESS
Anesthesia: General | Site: Breast | Laterality: Right | Wound class: Dirty or Infected

## 2011-11-17 MED ORDER — SODIUM CHLORIDE 0.9 % IV SOLN
INTRAVENOUS | Status: DC | PRN
  Administered 2011-11-17: 21:00:00 via INTRAVENOUS

## 2011-11-17 MED ORDER — OXYCODONE HCL 5 MG PO TABS
5.0000 mg | ORAL_TABLET | Freq: Once | ORAL | Status: DC | PRN
Start: 2011-11-17 — End: 2011-11-17

## 2011-11-17 MED ORDER — MIDAZOLAM HCL 2 MG/2ML IJ SOLN: 1.0000 mg | INTRAMUSCULAR | Status: DC | PRN

## 2011-11-17 MED ORDER — BUPIVACAINE-EPINEPHRINE PF 0.5-1:200000 % IJ SOLN
INTRAMUSCULAR | Status: DC | PRN
  Administered 2011-11-17: 30 mL

## 2011-11-17 MED ORDER — MIDAZOLAM HCL 5 MG/5ML IJ SOLN
INTRAMUSCULAR | Status: DC | PRN
  Administered 2011-11-17: 2 mg via INTRAVENOUS

## 2011-11-17 MED ORDER — PANTOPRAZOLE SODIUM 40 MG IV SOLR
40.0000 mg | Freq: Every day | INTRAVENOUS | Status: DC
  Filled 2011-11-17 (×2): qty 40

## 2011-11-17 MED ORDER — GLYCOPYRROLATE 0.2 MG/ML IJ SOLN
INTRAMUSCULAR | Status: DC | PRN
  Administered 2011-11-17: 0.2 mg via INTRAVENOUS

## 2011-11-17 MED ORDER — INFLUENZA VIRUS VACC SPLIT PF IM SUSP
0.5000 mL | INTRAMUSCULAR | Status: AC
  Administered 2011-11-18: 0.5 mL via INTRAMUSCULAR
  Filled 2011-11-17: qty 0.5

## 2011-11-17 MED ORDER — OXYCODONE HCL 5 MG/5ML PO SOLN: 5.0000 mg | Freq: Once | ORAL | Status: DC | PRN

## 2011-11-17 MED ORDER — FENTANYL CITRATE 0.05 MG/ML IJ SOLN: 50.0000 ug | Freq: Once | INTRAMUSCULAR | Status: DC

## 2011-11-17 MED ORDER — HYDROMORPHONE HCL PF 1 MG/ML IJ SOLN
0.2500 mg | INTRAMUSCULAR | Status: DC | PRN
  Administered 2011-11-17 (×4): 0.5 mg via INTRAVENOUS

## 2011-11-17 MED ORDER — HYDROMORPHONE HCL PF 1 MG/ML IJ SOLN
INTRAMUSCULAR | Status: AC
  Filled 2011-11-17: qty 1

## 2011-11-17 MED ORDER — LIDOCAINE HCL (CARDIAC) 20 MG/ML IV SOLN
INTRAVENOUS | Status: DC | PRN
  Administered 2011-11-17: 100 mg via INTRAVENOUS

## 2011-11-17 MED ORDER — BUPIVACAINE-EPINEPHRINE PF 0.25-1:200000 % IJ SOLN
INTRAMUSCULAR | Status: AC
  Filled 2011-11-17: qty 30

## 2011-11-17 MED ORDER — PROMETHAZINE HCL 25 MG/ML IJ SOLN: 6.2500 mg | INTRAMUSCULAR | Status: DC | PRN

## 2011-11-17 MED ORDER — SUFENTANIL CITRATE 50 MCG/ML IV SOLN
INTRAVENOUS | Status: DC | PRN
  Administered 2011-11-17: 15 ug via INTRAVENOUS

## 2011-11-17 MED ORDER — SODIUM CHLORIDE 0.9 % IV SOLN
INTRAVENOUS | Status: DC
  Administered 2011-11-17 – 2011-11-18 (×2): via INTRAVENOUS

## 2011-11-17 MED ORDER — 0.9 % SODIUM CHLORIDE (POUR BTL) OPTIME
TOPICAL | Status: DC | PRN
  Administered 2011-11-17: 1000 mL

## 2011-11-17 MED ORDER — MORPHINE SULFATE 2 MG/ML IJ SOLN
2.0000 mg | INTRAMUSCULAR | Status: DC | PRN
  Administered 2011-11-17 (×2): 2 mg via INTRAVENOUS
  Filled 2011-11-17 (×2): qty 2
  Filled 2011-11-17: qty 1

## 2011-11-17 MED ORDER — DEXAMETHASONE SODIUM PHOSPHATE 4 MG/ML IJ SOLN
INTRAMUSCULAR | Status: DC | PRN
  Administered 2011-11-17: 4 mg via INTRAVENOUS

## 2011-11-17 MED ORDER — CEFAZOLIN SODIUM-DEXTROSE 2-3 GM-% IV SOLR
2.0000 g | Freq: Three times a day (TID) | INTRAVENOUS | Status: DC
  Administered 2011-11-17 – 2011-11-18 (×3): 2 g via INTRAVENOUS
  Filled 2011-11-17 (×5): qty 50

## 2011-11-17 MED ORDER — PROPOFOL 10 MG/ML IV BOLUS
INTRAVENOUS | Status: DC | PRN
  Administered 2011-11-17: 200 mg via INTRAVENOUS

## 2011-11-17 MED ORDER — ONDANSETRON HCL 4 MG/2ML IJ SOLN
INTRAMUSCULAR | Status: DC | PRN
  Administered 2011-11-17: 4 mg via INTRAVENOUS

## 2011-11-17 MED ORDER — ONDANSETRON HCL 4 MG/2ML IJ SOLN: 4.0000 mg | Freq: Four times a day (QID) | INTRAMUSCULAR | Status: DC | PRN

## 2011-11-17 SURGICAL SUPPLY — 29 items
BLADE SURG 10 STRL SS (BLADE) ×2 IMPLANT
BLADE SURG ROTATE 9660 (MISCELLANEOUS) IMPLANT
CANISTER SUCTION 2500CC (MISCELLANEOUS) ×2 IMPLANT
CLOTH BEACON ORANGE TIMEOUT ST (SAFETY) ×2 IMPLANT
COVER SURGICAL LIGHT HANDLE (MISCELLANEOUS) ×2 IMPLANT
DRAPE LAPAROTOMY TRNSV 102X78 (DRAPE) ×2 IMPLANT
DRAPE UTILITY 15X26 W/TAPE STR (DRAPE) ×4 IMPLANT
ELECT CAUTERY BLADE 6.4 (BLADE) ×2 IMPLANT
ELECT REM PT RETURN 9FT ADLT (ELECTROSURGICAL) ×2
ELECTRODE REM PT RTRN 9FT ADLT (ELECTROSURGICAL) ×1 IMPLANT
GLOVE BIO SURGEON STRL SZ7 (GLOVE) ×2 IMPLANT
GOWN STRL NON-REIN LRG LVL3 (GOWN DISPOSABLE) ×4 IMPLANT
KIT BASIN OR (CUSTOM PROCEDURE TRAY) ×2 IMPLANT
KIT ROOM TURNOVER OR (KITS) ×2 IMPLANT
NS IRRIG 1000ML POUR BTL (IV SOLUTION) ×2 IMPLANT
PACK SURGICAL SETUP 50X90 (CUSTOM PROCEDURE TRAY) ×2 IMPLANT
PAD ARMBOARD 7.5X6 YLW CONV (MISCELLANEOUS) ×4 IMPLANT
PENCIL BUTTON HOLSTER BLD 10FT (ELECTRODE) ×2 IMPLANT
SPONGE GAUZE 4X4 12PLY (GAUZE/BANDAGES/DRESSINGS) ×2 IMPLANT
SPONGE LAP 18X18 X RAY DECT (DISPOSABLE) ×2 IMPLANT
SWAB COLLECTION DEVICE MRSA (MISCELLANEOUS) ×2 IMPLANT
SYR BULB IRRIGATION 50ML (SYRINGE) IMPLANT
TAPE CLOTH SURG 6X10 WHT LF (GAUZE/BANDAGES/DRESSINGS) ×1 IMPLANT
TOWEL OR 17X24 6PK STRL BLUE (TOWEL DISPOSABLE) ×2 IMPLANT
TOWEL OR 17X26 10 PK STRL BLUE (TOWEL DISPOSABLE) ×2 IMPLANT
TUBE ANAEROBIC SPECIMEN COL (MISCELLANEOUS) ×2 IMPLANT
TUBE CONNECTING 12X1/4 (SUCTIONS) ×2 IMPLANT
WATER STERILE IRR 1000ML POUR (IV SOLUTION) ×2 IMPLANT
YANKAUER SUCT BULB TIP NO VENT (SUCTIONS) ×2 IMPLANT

## 2011-11-17 NOTE — H&P (Signed)
HPI  Cassidy [name] is a 46 y.o. female.  HPI  The patient comes in for recheck of her right breast abscess. The patient has had multiple surgeries to drain this breast abscess. It continues to recur. He has become quite tender and painful in the upper part of her breast. Her last surgery was 09/16/11 and the wound healed well. It is unclear why she continues to have these recurrent abscesses. She has not had any recent imaging of her breast.  Past Medical History  Diagnosis Date  . Hypertension  . Depression  . GERD (gastroesophageal reflux disease)  . Breast abscess  recurrent; on right  . High cholesterol  . Type II diabetes mellitus  . Anemia  . Sickle cell trait  . Fibromyalgia  . Bipolar depression  . PTSD (post-traumatic stress disorder)  . Genital warts  Past Surgical History  Procedure Date  . Bladder surgery 1980  . Irrigation and debridement abscess 12/19/2010  Procedure: IRRIGATION AND DEBRIDEMENT ABSCESS; Surgeon: Emelia Loron, MD; Location: [facility] OR; Service: General; Laterality: Right;  . Incise and drain abcess ; 04/26/11; 08/13/11  right breast  . Breast surgery  I&D abscess; right nipple area  . Tonsillectomy 1988  . Eye surgery  . Refractive surgery ~ 2010  right  . Irrigation and debridement abscess 08/13/2011  Procedure: MINOR INCISION AND DRAINAGE OF ABSCESS; Surgeon: Cherylynn Ridges, MD; Location: [facility] OR; Service: General; Laterality: Right; Right Breast  Family History  Problem Relation Age of Onset  . COPD Mother  . Cancer Mother  lymphoma  . Cancer Maternal Aunt  breast, colon  Social History  History  Substance Use Topics  . Smoking status: Current Everyday Smoker -- 0.2 packs/day for 30 years  Types: Cigarettes  . Smokeless tobacco: Never Used  Comment: 08/14/11 "quit for 3 wk 05/2011; was smoking 1 ppd since age 15 before I quit; at least I've cut down to 1/4 ppd"  . 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 buPROPion (WELLBUTRIN SR) 150 MG 12 hr tablet Take 150 mg by mouth daily.  Marland Kitchen darifenacin (ENABLEX) 15 MG 24 hr tablet Take 15 mg by mouth daily.  . divalproex (DEPAKOTE ER) 500 MG 24 hr tablet Take 500 mg by mouth at bedtime.  Marland Kitchen exenatide (BYETTA) 5 MCG/0.02ML SOLN Inject 5 mcg into the skin 2 (two) times daily with a meal.  . lisinopril (PRINIVIL,ZESTRIL) 10 MG tablet Take 10 mg by mouth daily.  . metFORMIN (GLUCOPHAGE) 500 MG tablet Take 500 mg by mouth 2 (two) times daily with a meal.  . PARoxetine (PAXIL-CR) 37.5 MG 24 hr tablet Take 37.5 mg by mouth every morning.  Bertram Gala Glycol-Propyl Glycol (SYSTANE) 0.4-0.3 % SOLN Apply 2 drops to eye daily as needed. For dry eyes  . sulfamethoxazole-trimethoprim (BACTRIM DS) 800-160 MG per tablet Take 1 tablet by mouth 2 (two) times daily. X 10 days. Started on 08/09/11.  Marland Kitchen doxycycline (VIBRAMYCIN) 100 MG capsule Take 1 capsule (100 mg total) by mouth 2 (two) times daily. 28 capsule 0  . oxyCODONE-acetaminophen (PERCOCET/ROXICET) 5-325 MG per tablet Take 1 tablet by mouth every 4 (four) hours as needed for pain. 40 tablet 0  Review of Systems  Review of Systems  Filed Vitals:    11/17/11 1130   BP:  124/74   Pulse:  102   Temp:  97.2 F (36.2  C)   Physical Exam  Physical Exam  Her right breast shows that all of the incisions are well healed around her areola. The upper part of her areola is contracted. This area is erythematous and very tender. To deep palpation the mass seems to be about 4 cm across.  Data Reviewed  Assessment  Impression: Recurrent breast abscess, right-unclear etiology  Plan  Plan: Direct admit for incision and drainage of recurrent right breast abscess. The surgical procedure has been discussed with the patient. Potential risks, benefits, alternative treatments, and expected outcomes have been explained. All of the  patient's questions at this time have been answered. The likelihood of reaching the patient's treatment goal is good. The patient understand the proposed surgical procedure and wishes to proceed.  Wilmon Arms. Corliss Skains, MD, [facility]  11/17/2011  11:49 AM

## 2011-11-17 NOTE — Anesthesia Preprocedure Evaluation (Signed)
Anesthesia Evaluation  Patient identified by MRN, date of birth, ID band Patient awake    Reviewed: Allergy & Precautions, H&P , NPO status , Patient's Chart, lab work & pertinent test results  Airway Mallampati: II TM Distance: >3 FB Neck ROM: full    Dental   Pulmonary asthma ,  + rhonchi         Cardiovascular hypertension, Rhythm:Regular Rate:Tachycardia     Neuro/Psych PSYCHIATRIC DISORDERS Depression Bipolar Disorder    GI/Hepatic GERD-  ,  Endo/Other  diabetes, Type 2obese  Renal/GU      Musculoskeletal  (+) Fibromyalgia -  Abdominal (+) + obese,   Peds  Hematology  (+) Blood dyscrasia, Sickle cell trait ,   Anesthesia Other Findings   Reproductive/Obstetrics                           Anesthesia Physical Anesthesia Plan  ASA: III  Anesthesia Plan: General   Post-op Pain Management:    Induction: Intravenous  Airway Management Planned: LMA  Additional Equipment:   Intra-op Plan:   Post-operative Plan: Extubation in OR  Informed Consent: I have reviewed the patients History and Physical, chart, labs and discussed the procedure including the risks, benefits and alternatives for the proposed anesthesia with the patient or authorized representative who has indicated his/her understanding and acceptance.     Plan Discussed with: CRNA and Surgeon  Anesthesia Plan Comments:         Anesthesia Quick Evaluation

## 2011-11-17 NOTE — Op Note (Signed)
Preop diagnosis: Recurrent right breast abscess Postop diagnosis: Same Procedure performed: Incision and drainage of right breast abscess Surgeon:Jacqulynn Shappell K. Anesthesia: Gen. Via LMA Indications: This is a 46 year old female who presents with a history of multiple right breast abscesses. She had this drained most recently about a month and a half ago. This heal slowly with wet to dry dressings. She had been doing quite well.  At her last visit several weeks ago the wound had completely healed. However she presented with acute onset of swelling and extreme tenderness just inferior to her incision. She was seen in the office morning and I directly admitted to her the hospital for drainage.  Description of procedure: The patient was brought to the operating room placed in supine position on the operating room table. After an adequate level of general anesthesia was obtained, her right breast was prepped with chlor prep and draped sterile fashion. A timeout was taken to ensure the proper patient proper procedure. I infiltrated the area around the abscess with quarter percent Marcaine with epinephrine. I open her previous incision. We encountered a moderate amount of purulent fluid. This was cultured and sent for microbiology. I then opened the incision wider. We entered a abscess cavity measuring about 2 cm across. I made the decision to excise the entire abscess cavity. This was done with cautery. Inferiorly the abscess cavity involves some of the posterior surface of the nipple. I left this intact but excised as much of the wall of the abscess cavity as possible. We irrigated thoroughly and inspected for hemostasis. The wound was then packed with 3 4 x 4 inch gauze pads that had been moistened with saline. A dry dressing was applied. The patient was then extubated but recovery stable condition. All sponge, initially, and needle counts are correct.  Wilmon Arms. Corliss Skains, MD, [facility]  Surgery  11/17/2011 9:44 PM

## 2011-11-17 NOTE — Anesthesia Postprocedure Evaluation (Signed)
  Anesthesia Post-op Note  Patient: Cassidy [name]  Procedure(s) Performed: Procedure(s) (LRB) with comments: IRRIGATION AND DEBRIDEMENT ABSCESS (Right) - irrigation and debridement right recurrent breast abscess  Patient Location: PACU  Anesthesia Type:General  Level of Consciousness: awake and alert   Airway and Oxygen Therapy: Patient Spontanous Breathing  Post-op Pain: mild  Post-op Assessment: Post-op Vital signs reviewed, Patient's Cardiovascular Status Stable, Respiratory Function Stable, Patent Airway, No signs of Nausea or vomiting and Pain level controlled  Post-op Vital Signs: stable  Complications: No apparent anesthesia complications

## 2011-11-17 NOTE — Progress Notes (Signed)
HPI  Cassidy [name] is a 46 y.o. female.  HPI  The patient comes in for recheck of her right breast abscess. The patient has had multiple surgeries to drain this breast abscess. It continues to recur. He has become quite tender and painful in the upper part of her breast. Her last surgery was 09/16/11 and the wound healed well. It is unclear why she continues to have these recurrent abscesses. She has not had any recent imaging of her breast.  Past Medical History  Diagnosis Date  . Hypertension  . Depression  . GERD (gastroesophageal reflux disease)  . Breast abscess  recurrent; on right  . High cholesterol  . Type II diabetes mellitus  . Anemia  . Sickle cell trait  . Fibromyalgia  . Bipolar depression  . PTSD (post-traumatic stress disorder)  . Genital warts  Past Surgical History  Procedure Date  . Bladder surgery 1980  . Irrigation and debridement abscess 12/19/2010  Procedure: IRRIGATION AND DEBRIDEMENT ABSCESS; Surgeon: Raye Slyter Wakefield, MD; Location: [facility] OR; Service: General; Laterality: Right;  . Incise and drain abcess ; 04/26/11; 08/13/11  right breast  . Breast surgery  I&D abscess; right nipple area  . Tonsillectomy 1988  . Eye surgery  . Refractive surgery ~ 2010  right  . Irrigation and debridement abscess 08/13/2011  Procedure: MINOR INCISION AND DRAINAGE OF ABSCESS; Surgeon: James O Wyatt, MD; Location: [facility] OR; Service: General; Laterality: Right; Right Breast  Family History  Problem Relation Age of Onset  . COPD Mother  . Cancer Mother  lymphoma  . Cancer Maternal Aunt  breast, colon  Social History  History  Substance Use Topics  . Smoking status: Current Everyday Smoker -- 48 packs/day for 30 years  Types: Cigarettes  . Smokeless tobacco: Never Used  Comment: 08/14/11 "quit for 3 wk 05/2011; was smoking 1 ppd since age 14 before I quit; at least I've cut down to 1/4 ppd"  . 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 buPROPion (WELLBUTRIN SR) 150 MG 12 hr tablet Take 150 mg by mouth daily.  . darifenacin (ENABLEX) 15 MG 24 hr tablet Take 15 mg by mouth daily.  . divalproex (DEPAKOTE ER) 500 MG 24 hr tablet Take 500 mg by mouth at bedtime.  . exenatide (BYETTA) 5 MCG/0.02ML SOLN Inject 5 mcg into the skin 2 (two) times daily with a meal.  . lisinopril (PRINIVIL,ZESTRIL) 10 MG tablet Take 10 mg by mouth daily.  . metFORMIN (GLUCOPHAGE) 500 MG tablet Take 500 mg by mouth 2 (two) times daily with a meal.  . PARoxetine (PAXIL-CR) 37.5 MG 24 hr tablet Take 37.5 mg by mouth every morning.  . Polyethyl Glycol-Propyl Glycol (SYSTANE) 0.4-0.3 % SOLN Apply 2 drops to eye daily as needed. For dry eyes  . sulfamethoxazole-trimethoprim (BACTRIM DS) 800-160 MG per tablet Take 1 tablet by mouth 2 (two) times daily. X 10 days. Started on 08/09/11.  . doxycycline (VIBRAMYCIN) 100 MG capsule Take 1 capsule (100 mg total) by mouth 2 (two) times daily. 28 capsule 0  . oxyCODONE-acetaminophen (PERCOCET/ROXICET) 5-325 MG per tablet Take 1 tablet by mouth every 4 (four) hours as needed for pain. 40 tablet 0  Review of Systems  Review of Systems  Filed Vitals:    11/17/11 1130   BP:  124/74   Pulse:  102   Temp:  97.2 F (36.2   C)   Physical Exam  Physical Exam  Her right breast shows that all of the incisions are well healed around her areola. The upper part of her areola is contracted. This area is erythematous and very tender. To deep palpation the mass seems to be about 4 cm across.  Data Reviewed  Assessment  Impression: Recurrent breast abscess, right-unclear etiology  Plan  Plan: Direct admit for incision and drainage of recurrent right breast abscess. The surgical procedure has been discussed with the patient. Potential risks, benefits, alternative treatments, and expected outcomes have been explained. All of the  patient's questions at this time have been answered. The likelihood of reaching the patient's treatment goal is good. The patient understand the proposed surgical procedure and wishes to proceed.  Suheyb Raucci K. Ordell Prichett, MD, FACS  [facility]  11/17/2011  11:49 AM     

## 2011-11-17 NOTE — Transfer of Care (Signed)
Immediate Anesthesia Transfer of Care Note  Patient: Cassidy [name]  Procedure(s) Performed: Procedure(s) (LRB) with comments: IRRIGATION AND DEBRIDEMENT ABSCESS (Right)  Patient Location: PACU  Anesthesia Type:General  Level of Consciousness: awake, oriented and patient cooperative  Airway & Oxygen Therapy: Patient Spontanous Breathing and Patient connected to nasal cannula oxygen  Post-op Assessment: Report given to PACU RN, Post -op Vital signs reviewed and stable and Patient moving all extremities X 4  Post vital signs: Reviewed and stable  Complications: No apparent anesthesia complications

## 2011-11-18 LAB — GLUCOSE, CAPILLARY
Glucose-Capillary: 324 mg/dL — ABNORMAL HIGH (ref 70–99)
Glucose-Capillary: 210 mg/dL — ABNORMAL HIGH (ref 70–99)

## 2011-11-18 MED ORDER — OXYCODONE-ACETAMINOPHEN 5-325 MG PO TABS
1.0000 | ORAL_TABLET | ORAL | Status: DC | PRN
  Administered 2011-11-18: 2 via ORAL
  Filled 2011-11-18: qty 2

## 2011-11-18 MED ORDER — ONDANSETRON HCL 4 MG/2ML IJ SOLN
4.0000 mg | Freq: Four times a day (QID) | INTRAMUSCULAR | Status: DC | PRN
  Administered 2011-11-18: 4 mg via INTRAVENOUS
  Filled 2011-11-18: qty 2

## 2011-11-18 MED ORDER — BUPROPION HCL ER (SR) 150 MG PO TB12
150.0000 mg | ORAL_TABLET | Freq: Every day | ORAL | Status: DC
  Administered 2011-11-18: 150 mg via ORAL
  Filled 2011-11-18: qty 1

## 2011-11-18 MED ORDER — METFORMIN HCL 500 MG PO TABS
500.0000 mg | ORAL_TABLET | Freq: Two times a day (BID) | ORAL | Status: DC
  Administered 2011-11-18: 500 mg via ORAL
  Filled 2011-11-18 (×3): qty 1

## 2011-11-18 MED ORDER — ONDANSETRON HCL 4 MG PO TABS: 4.0000 mg | ORAL_TABLET | Freq: Four times a day (QID) | ORAL | Status: DC | PRN

## 2011-11-18 MED ORDER — INSULIN ASPART 100 UNIT/ML ~~LOC~~ SOLN
4.0000 [IU] | Freq: Three times a day (TID) | SUBCUTANEOUS | Status: DC
  Administered 2011-11-18: 4 [IU] via SUBCUTANEOUS

## 2011-11-18 MED ORDER — GABAPENTIN 300 MG PO CAPS
300.0000 mg | ORAL_CAPSULE | Freq: Two times a day (BID) | ORAL | Status: DC
Start: 2011-11-18 — End: 2011-11-18
  Administered 2011-11-18 (×2): 300 mg via ORAL
  Filled 2011-11-18 (×3): qty 1

## 2011-11-18 MED ORDER — INSULIN ASPART 100 UNIT/ML ~~LOC~~ SOLN
0.0000 [IU] | Freq: Three times a day (TID) | SUBCUTANEOUS | Status: DC
  Administered 2011-11-18: 11 [IU] via SUBCUTANEOUS

## 2011-11-18 MED ORDER — LISINOPRIL 10 MG PO TABS
10.0000 mg | ORAL_TABLET | Freq: Every day | ORAL | Status: DC
  Administered 2011-11-18: 10 mg via ORAL
  Filled 2011-11-18: qty 1

## 2011-11-18 MED ORDER — CIPROFLOXACIN HCL 500 MG PO TABS: 500.0000 mg | ORAL_TABLET | Freq: Two times a day (BID) | ORAL | Status: DC

## 2011-11-18 MED ORDER — MORPHINE SULFATE 2 MG/ML IJ SOLN
2.0000 mg | INTRAMUSCULAR | Status: DC | PRN
  Administered 2011-11-18: 4 mg via INTRAVENOUS

## 2011-11-18 MED ORDER — OXYCODONE-ACETAMINOPHEN 5-325 MG PO TABS: 1.0000 | ORAL_TABLET | ORAL | Status: DC | PRN

## 2011-11-18 MED ORDER — DIVALPROEX SODIUM ER 500 MG PO TB24
500.0000 mg | ORAL_TABLET | Freq: Every day | ORAL | Status: DC
  Administered 2011-11-18: 500 mg via ORAL
  Filled 2011-11-18 (×2): qty 1

## 2011-11-18 MED ORDER — PAROXETINE HCL ER 37.5 MG PO TB24
37.5000 mg | ORAL_TABLET | ORAL | Status: DC
  Administered 2011-11-18: 37.5 mg via ORAL
  Filled 2011-11-18 (×2): qty 1

## 2011-11-18 MED ORDER — DARIFENACIN HYDROBROMIDE ER 15 MG PO TB24
15.0000 mg | ORAL_TABLET | Freq: Every day | ORAL | Status: DC
  Administered 2011-11-18: 15 mg via ORAL
  Filled 2011-11-18: qty 1

## 2011-11-18 NOTE — Progress Notes (Signed)
Discharged patient to home with instructions. 

## 2011-11-18 NOTE — Care Management Note (Signed)
  Page 1 of 1   11/18/2011     9:46:11 AM   CARE MANAGEMENT NOTE 11/18/2011  Patient:  Cassidy [name], Cassidy [name]   Account Number:  [id number]  Date Initiated:  11/18/2011  Documentation initiated by:  Ronny Flurry  Subjective/Objective Assessment:     Action/Plan:   Anticipated DC Date:  11/18/2011   Anticipated DC Plan:  HOME W HOME HEALTH SERVICES         Choice offered to / List presented to:  C-1 Patient        HH arranged  HH-1 RN      [facility] agency  [facility]   Status of service:  Completed, signed off Medicare Important Message given?   (If response is "NO", the following Medicare IM given date fields will be blank) Date Medicare IM given:   Date Additional Medicare IM given:    Discharge Disposition:    Per UR Regulation:  Reviewed for med. necessity/level of care/duration of stay  If discussed at Long Length of Stay Meetings, dates discussed:    Comments:

## 2011-11-18 NOTE — Progress Notes (Signed)
Inpatient Diabetes Program Recommendations  AACE/ADA: New Consensus Statement on Inpatient Glycemic Control (2013)  Target Ranges:  Prepandial:   less than 140 mg/dL      Peak postprandial:   less than 180 mg/dL (1-2 hours)      Critically ill patients:  140 - 180 mg/dL   Last A1C=10.5 September 16, 2011 indicating poor glucose control at home.  Tighter glucose management may help prevent recurrent infection.  Needs follow-up with primary MD for glucose management.   Thank you  Piedad Climes RN,BSN,CDE Inpatient Diabetes Coordinator [phone] (team pager)

## 2011-11-18 NOTE — Discharge Summary (Signed)
Physician Discharge Summary  Patient ID: Cassidy [name] MRN: [id number] DOB/AGE: July 01, 1965 46 y.o.  Admit date: 11/17/2011 Discharge date: 11/18/2011  Admission Diagnoses:  Recurrent right breast abscess  Discharge Diagnoses: same  Active Problems:  * No active hospital problems. *    Discharged Condition: good  Hospital Course: Direct admit from office on 11/17/11 for recurrent right breast abscess.  Abscess drained in OR on 11/17/11.  Ready for discharge this morning.  Consults: None  Significant Diagnostic Studies: none  Treatments: surgery: incision and drainage of right breast abscess  Discharge Exam: Blood pressure 129/100, pulse 107, temperature 98.2 F (36.8 C), temperature source Oral, resp. rate 16, height 5' 7.5" (1.715 m), weight 246 lb 9.6 oz (111.857 kg), last menstrual period 11/06/2011, SpO2 95.00%. Right breast - wound clean, minimal induration; minimal drainage  Disposition: 01-Home or Self Care Home health nursing for dressing changes.    Discharge Orders    Future Orders Please Complete By Expires   Diet general      Increase activity slowly      May walk up steps      May shower / Bathe      Driving Restrictions      Comments:   Do not drive while taking pain medications   Call MD for:  temperature >100.4      Call MD for:  persistant nausea and vomiting      Call MD for:  severe uncontrolled pain      Call MD for:  redness, tenderness, or signs of infection (pain, swelling, redness, odor or green/yellow discharge around incision site)      Discharge instructions      Comments:   Daily wet to dry dressing changes with home health nursing   Change dressing (specify)      Comments:   Dressing change: one time per day using saline-moistened 4x4 gauze.       Medication List     As of 11/18/2011  9:34 AM    TAKE these medications         buPROPion 150 MG 12 hr tablet   Commonly known as: WELLBUTRIN SR   Take 150 mg by mouth daily.     divalproex 500 MG 24 hr tablet   Commonly known as: DEPAKOTE ER   Take 500 mg by mouth at bedtime.      ENABLEX 15 MG 24 hr tablet   Generic drug: darifenacin   Take 15 mg by mouth daily.      gabapentin 300 MG capsule   Commonly known as: NEURONTIN   Take 300 mg by mouth 2 (two) times daily.      lisinopril 10 MG tablet   Commonly known as: PRINIVIL,ZESTRIL   Take 10 mg by mouth daily.      metFORMIN 500 MG tablet   Commonly known as: GLUCOPHAGE   Take 500 mg by mouth 2 (two) times daily with a meal.      oxyCODONE-acetaminophen 5-325 MG per tablet   Commonly known as: PERCOCET/ROXICET   Take 1-2 tablets by mouth every 4 (four) hours as needed.      PARoxetine 37.5 MG 24 hr tablet   Commonly known as: PAXIL-CR   Take 37.5 mg by mouth every morning.      SYSTANE 0.4-0.3 % Soln   Generic drug: Polyethyl Glycol-Propyl Glycol   Apply 2 drops to eye daily as needed. For dry eyes  Follow-up Information    Follow up with Wynona Luna., MD. Schedule an appointment as soon as possible for a visit in 1 week.   Contact information:   [address] [place] [address] [phone]          Signed: Wynona Luna. 11/18/2011, 9:34 AM

## 2011-11-19 ENCOUNTER — Encounter (INDEPENDENT_AMBULATORY_CARE_PROVIDER_SITE_OTHER): Payer: Self-pay | Admitting: Surgery

## 2011-11-19 ENCOUNTER — Encounter (HOSPITAL_COMMUNITY): Payer: Self-pay | Admitting: Surgery

## 2011-11-20 LAB — CULTURE, ROUTINE-ABSCESS

## 2011-11-23 ENCOUNTER — Ambulatory Visit (INDEPENDENT_AMBULATORY_CARE_PROVIDER_SITE_OTHER): Payer: Medicaid Other | Admitting: Surgery

## 2011-11-23 ENCOUNTER — Encounter (INDEPENDENT_AMBULATORY_CARE_PROVIDER_SITE_OTHER): Payer: Self-pay | Admitting: Surgery

## 2011-11-23 VITALS — BP 140/62 | HR 80 | Temp 97.0°F | Resp 16 | Ht 67.5 in | Wt 244.0 lb

## 2011-11-23 DIAGNOSIS — N611 Abscess of the breast and nipple: Secondary | ICD-10-CM

## 2011-11-23 DIAGNOSIS — N61 Mastitis without abscess: Secondary | ICD-10-CM

## 2011-11-23 LAB — ANAEROBIC CULTURE

## 2011-11-23 MED ORDER — FLUCONAZOLE 150 MG PO TABS: 150.0000 mg | ORAL_TABLET | Freq: Once | ORAL | Status: AC

## 2011-11-23 NOTE — Progress Notes (Signed)
S/p incision and drainage of recurrent right breast abscess last week.  The culture is growing Proteus that is sensitive to Cipro.  She has not finished her course of Cipro.  The wound is quite clean with no surrounding induration or inflammation.  Continue daily dressing changes.  Follow-up 1 week.  Wilmon Arms. Corliss Skains, MD, [facility]  11/23/2011 1:55 PM

## 2011-12-02 ENCOUNTER — Encounter (INDEPENDENT_AMBULATORY_CARE_PROVIDER_SITE_OTHER): Payer: Self-pay | Admitting: General Surgery

## 2011-12-02 ENCOUNTER — Ambulatory Visit (INDEPENDENT_AMBULATORY_CARE_PROVIDER_SITE_OTHER): Payer: Medicaid Other | Admitting: General Surgery

## 2011-12-02 DIAGNOSIS — N611 Abscess of the breast and nipple: Secondary | ICD-10-CM

## 2011-12-02 DIAGNOSIS — N61 Mastitis without abscess: Secondary | ICD-10-CM

## 2011-12-02 NOTE — Progress Notes (Signed)
Patient ID: Cassidy [name], female   DOB: 1965-10-17, 46 y.o.   MRN: [id number] The patient is a 46 year old female with previous I&D of her right breast abscess. Patient presents for a wound dressing as well as assessment for a left not much of her breast. The wound is clean dry and intact there is minimal drainage from the area there is no erythema the skin at this time. Upon palpation I do not see any new abscess at this time.  Patient follow up as scheduled Prescription for Percocet 05/325

## 2011-12-14 ENCOUNTER — Ambulatory Visit (INDEPENDENT_AMBULATORY_CARE_PROVIDER_SITE_OTHER): Payer: Medicaid Other | Admitting: Surgery

## 2011-12-14 ENCOUNTER — Encounter (INDEPENDENT_AMBULATORY_CARE_PROVIDER_SITE_OTHER): Payer: Self-pay | Admitting: Surgery

## 2011-12-14 VITALS — BP 125/80 | HR 74 | Temp 98.6°F | Resp 16 | Ht 67.5 in | Wt 243.0 lb

## 2011-12-14 DIAGNOSIS — N611 Abscess of the breast and nipple: Secondary | ICD-10-CM

## 2011-12-14 DIAGNOSIS — N61 Mastitis without abscess: Secondary | ICD-10-CM

## 2011-12-14 MED ORDER — OXYCODONE-ACETAMINOPHEN 5-325 MG PO TABS: 1.0000 | ORAL_TABLET | ORAL | Status: DC | PRN

## 2011-12-14 NOTE — Progress Notes (Signed)
Status post incision and drainage of recurrent right breast abscess on 11/17/11. The wound is healing nicely. Is very clean with minimal drainage. She is having a lot of soreness and tenderness in the lower medial part of her breast. There is no erythema or induration in this area. This is well away from her abscess. I encouraged her to use anti-inflammatory medications as well as her when necessary pain medications. We also discussed proper support. She is wearing a sports bra as much she can. We will recheck her in 2 weeks. I did refill her Percocet.  Wilmon Arms. Corliss Skains, MD, [facility]  12/14/2011 10:14 AM

## 2011-12-30 ENCOUNTER — Ambulatory Visit (INDEPENDENT_AMBULATORY_CARE_PROVIDER_SITE_OTHER): Payer: Medicaid Other | Admitting: Surgery

## 2011-12-30 ENCOUNTER — Encounter (INDEPENDENT_AMBULATORY_CARE_PROVIDER_SITE_OTHER): Payer: Self-pay | Admitting: Surgery

## 2011-12-30 VITALS — BP 130/81 | HR 88 | Temp 98.0°F | Resp 20 | Ht 67.5 in | Wt 242.8 lb

## 2011-12-30 DIAGNOSIS — N61 Mastitis without abscess: Secondary | ICD-10-CM

## 2011-12-30 DIAGNOSIS — N611 Abscess of the breast and nipple: Secondary | ICD-10-CM

## 2011-12-30 NOTE — Progress Notes (Signed)
Wound check.  The wound is very small - only about 1 cm across and 1 cm deep.  Well-granulated; minimal drainage; no other tenderness or induration.  Continue dressing changes.  Follow-up 3 weeks.  Wilmon Arms. Corliss Skains, MD, [facility]  12/30/2011 10:10 AM

## 2012-01-21 ENCOUNTER — Encounter (INDEPENDENT_AMBULATORY_CARE_PROVIDER_SITE_OTHER): Payer: Self-pay | Admitting: Surgery

## 2012-01-21 ENCOUNTER — Ambulatory Visit (INDEPENDENT_AMBULATORY_CARE_PROVIDER_SITE_OTHER): Payer: Medicaid Other | Admitting: Surgery

## 2012-01-21 VITALS — BP 144/82 | HR 84 | Temp 96.4°F | Resp 18 | Ht 67.5 in | Wt 244.8 lb

## 2012-01-21 DIAGNOSIS — N61 Mastitis without abscess: Secondary | ICD-10-CM

## 2012-01-21 DIAGNOSIS — N611 Abscess of the breast and nipple: Secondary | ICD-10-CM

## 2012-01-21 NOTE — Progress Notes (Signed)
Final recheck of her right breast abscess. The wound seems to be completely healed. She still has some skin sensitivity but no induration or erythema. The rest of her breast feels normal. The patient is overdue for her mammogram. She feels that her skin is still too sensitive to be compressed for mammogram. Encouraged her to make an appointment for the next 3 or 4 weeks. Hopefully by then the pain will be resolved then she can proceed with her mammogram. We will see her back as needed.  Wilmon Arms. Corliss Skains, MD, [facility] Surgery  01/21/2012 12:07 PM

## 2012-02-29 ENCOUNTER — Other Ambulatory Visit: Payer: Self-pay | Admitting: Internal Medicine

## 2012-02-29 DIAGNOSIS — Z1231 Encounter for screening mammogram for malignant neoplasm of breast: Secondary | ICD-10-CM

## 2012-03-02 ENCOUNTER — Other Ambulatory Visit: Payer: Self-pay | Admitting: Otolaryngology

## 2012-03-02 DIAGNOSIS — R1310 Dysphagia, unspecified: Secondary | ICD-10-CM

## 2012-03-09 ENCOUNTER — Ambulatory Visit
Admission: RE | Admit: 2012-03-09 | Discharge: 2012-03-09 | Disposition: A | Discharge status: Home / Self Care | Payer: Medicaid Other | Source: Ambulatory Visit | Attending: Otolaryngology | Admitting: Otolaryngology

## 2012-03-10 ENCOUNTER — Emergency Department (HOSPITAL_COMMUNITY)
Admission: EM | Admit: 2012-03-10 | Discharge: 2012-03-10 | Disposition: A | Discharge status: Home / Self Care | Payer: Medicaid Other | Attending: Emergency Medicine | Admitting: Emergency Medicine

## 2012-03-10 ENCOUNTER — Encounter (HOSPITAL_COMMUNITY): Payer: Self-pay | Admitting: Emergency Medicine

## 2012-03-10 DIAGNOSIS — Z8719 Personal history of other diseases of the digestive system: Secondary | ICD-10-CM | POA: Insufficient documentation

## 2012-03-10 DIAGNOSIS — F4310 Post-traumatic stress disorder, unspecified: Secondary | ICD-10-CM | POA: Insufficient documentation

## 2012-03-10 DIAGNOSIS — F3130 Bipolar disorder, current episode depressed, mild or moderate severity, unspecified: Secondary | ICD-10-CM | POA: Insufficient documentation

## 2012-03-10 DIAGNOSIS — Z8639 Personal history of other endocrine, nutritional and metabolic disease: Secondary | ICD-10-CM | POA: Insufficient documentation

## 2012-03-10 DIAGNOSIS — F17200 Nicotine dependence, unspecified, uncomplicated: Secondary | ICD-10-CM | POA: Insufficient documentation

## 2012-03-10 DIAGNOSIS — Z8742 Personal history of other diseases of the female genital tract: Secondary | ICD-10-CM | POA: Insufficient documentation

## 2012-03-10 DIAGNOSIS — K219 Gastro-esophageal reflux disease without esophagitis: Secondary | ICD-10-CM | POA: Insufficient documentation

## 2012-03-10 DIAGNOSIS — Z9889 Other specified postprocedural states: Secondary | ICD-10-CM | POA: Insufficient documentation

## 2012-03-10 DIAGNOSIS — Z8739 Personal history of other diseases of the musculoskeletal system and connective tissue: Secondary | ICD-10-CM | POA: Insufficient documentation

## 2012-03-10 DIAGNOSIS — Z79899 Other long term (current) drug therapy: Secondary | ICD-10-CM | POA: Insufficient documentation

## 2012-03-10 DIAGNOSIS — E1149 Type 2 diabetes mellitus with other diabetic neurological complication: Secondary | ICD-10-CM | POA: Insufficient documentation

## 2012-03-10 DIAGNOSIS — E1142 Type 2 diabetes mellitus with diabetic polyneuropathy: Secondary | ICD-10-CM | POA: Insufficient documentation

## 2012-03-10 DIAGNOSIS — L0231 Cutaneous abscess of buttock: Secondary | ICD-10-CM | POA: Insufficient documentation

## 2012-03-10 DIAGNOSIS — I10 Essential (primary) hypertension: Secondary | ICD-10-CM | POA: Insufficient documentation

## 2012-03-10 DIAGNOSIS — Z862 Personal history of diseases of the blood and blood-forming organs and certain disorders involving the immune mechanism: Secondary | ICD-10-CM | POA: Insufficient documentation

## 2012-03-10 DIAGNOSIS — Z8619 Personal history of other infectious and parasitic diseases: Secondary | ICD-10-CM | POA: Insufficient documentation

## 2012-03-10 MED ORDER — OXYCODONE-ACETAMINOPHEN 5-325 MG PO TABS: 2.0000 | ORAL_TABLET | Freq: Four times a day (QID) | ORAL | Status: DC | PRN

## 2012-03-10 MED ORDER — SULFAMETHOXAZOLE-TRIMETHOPRIM 800-160 MG PO TABS: 1.0000 | ORAL_TABLET | Freq: Two times a day (BID) | ORAL | Status: DC

## 2012-03-10 NOTE — ED Notes (Signed)
Pt c/o abscess to left buttocks x 1 week not getting better; pt sts hx of similar

## 2012-03-10 NOTE — ED Provider Notes (Signed)
History    This chart was scribed for non-physician practitioner [name], PA-C working with [name], MD by [name], ED Scribe. This patient was seen in room TR05C/TR05C and the patient's care was started at 15:05.  CSN: [id number]  Arrival date & time 03/10/12  1435   None     Chief Complaint  Patient presents with  . Abscess    The history is provided by the patient. No language interpreter was used.    Cassidy [name] is a 47 y.o. female with h/o diabetes mellitus II, genital warts, anemia and HTN, who presents to the Emergency Department complaining of 1 week of recurrent, gradual onset, progressive, severe abscess to the left gluteal region. Pain is 10/10, described as sharp and shooting, and radiates to the vaginal area. The Pt has a significant h/o similar symptoms, with multiple Incisions and Drainage. Pt's last abscess occurred on the anterior pubic region, and was lanced and packed. Pt reports no improvement to current area despite using cold compress. No fever, chills, cough, congestion, rhinorrhea, chest pain, SOB, or n/v/d. Pt denies use of tobacco, alcohol, and illicit drug use.   Past Medical History  Diagnosis Date  . Hypertension   . GERD (gastroesophageal reflux disease)   . Breast abscess     recurrent; on right  . High cholesterol   . Type II diabetes mellitus   . Anemia   . Sickle cell trait   . Fibromyalgia   . Genital warts   . Constipation   . Diabetic peripheral neuropathy   . Depression   . Bipolar depression   . PTSD (post-traumatic stress disorder)     Past Surgical History  Procedure Laterality Date  . Bladder surgery  1980    "TVT"  . Irrigation and debridement abscess  12/19/2010    Procedure: IRRIGATION AND DEBRIDEMENT ABSCESS;  Surgeon: Emelia Loron, MD;  Location: [facility] OR;  Service: General;  Laterality: Right;  . Incise and drain abcess  ; 04/26/11; 08/13/11    right breast  . Tonsillectomy  1988  . Refractive surgery   ~ 2010    right  . Irrigation and debridement abscess  08/13/2011    Procedure: MINOR INCISION AND DRAINAGE OF ABSCESS;  Surgeon: Cherylynn Ridges, MD;  Location: [facility] OR;  Service: General;  Laterality: Right;  Right Breast   . Irrigation and debridement abscess  11/17/2011    Procedure: IRRIGATION AND DEBRIDEMENT ABSCESS;  Surgeon: Wilmon Arms. Corliss Skains, MD;  Location: [facility] OR;  Service: General;  Laterality: Right;  irrigation and debridement right recurrent breast abscess    Family History  Problem Relation Age of Onset  . COPD Mother   . Cancer Mother     lymphoma  . Cancer Maternal Aunt     breast, colon    History  Substance Use Topics  . Smoking status: Current Every Day Smoker -- 0.50 packs/day for 30 years    Types: Cigarettes  . Smokeless tobacco: Never Used     Comment: 08/14/11 "quit for 3 wk 05/2011; was smoking 1 ppd since age 27 before I quit; at least I've cut down to 1/4 ppd"  . 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    Allergies  Review of patient's allergies indicates no known allergies.  Home Medications   Current Outpatient Rx  Name  Route  Sig  Dispense  Refill  . albuterol (PROVENTIL HFA;VENTOLIN HFA) 108 (90 BASE) MCG/ACT inhaler   Inhalation   Inhale 2 puffs into the lungs every 6 (six) hours as needed for wheezing.         Marland Kitchen buPROPion (WELLBUTRIN SR) 150 MG 12 hr tablet   Oral   Take 150 mg by mouth daily.          Marland Kitchen darifenacin (ENABLEX) 15 MG 24 hr tablet   Oral   Take 15 mg by mouth daily.         . divalproex (DEPAKOTE ER) 500 MG 24 hr tablet   Oral   Take 500 mg by mouth at bedtime.          . gabapentin (NEURONTIN) 300 MG capsule   Oral   Take 300 mg by mouth 2 (two) times daily.         Marland Kitchen lisinopril (PRINIVIL,ZESTRIL) 10 MG tablet   Oral   Take 10 mg by mouth daily.         . metFORMIN (GLUCOPHAGE)  500 MG tablet   Oral   Take 500 mg by mouth 2 (two) times daily with a meal.           . PARoxetine (PAXIL-CR) 37.5 MG 24 hr tablet   Oral   Take 37.5 mg by mouth every morning.             BP 163/99  Pulse 108  Temp(Src) 98.2 F (36.8 C) (Oral)  Resp 18  SpO2 96%  Physical Exam  Nursing note and vitals reviewed. Constitutional: She is oriented to person, place, and time. She appears well-developed and well-nourished. No distress.  HENT:  Head: Normocephalic and atraumatic.  Eyes: EOM are normal.  Neck: Neck supple. No tracheal deviation present.  Cardiovascular: Normal rate.   Pulmonary/Chest: Effort normal. No respiratory distress.  Musculoskeletal: Normal range of motion.  Neurological: She is alert and oriented to person, place, and time.  Skin: Skin is warm and dry.  4 by 4 cm abscess to the left gluteal cheek, mild fluctuance, extremely tender to palpation, no streaking or signs of cellulitis.  Psychiatric: She has a normal mood and affect. Her behavior is normal.    ED Course  Procedures DIAGNOSTIC STUDIES: Oxygen Saturation is 96% on room air, adequate by my interpretation.    COORDINATION OF CARE: 15:05- Patient understand and agree with initial ED impression and plan with expectations set for ED visit. 15:08- Pelvic exam supervised by nurse. 15:09- Patient / Family / Caregiver informed of clinical course, understand medical decision-making process, and agree with plan. 15:25- Patient informed of clinical course, understand medical decision-making process, and agree with plan. Will have the Pt follow up with [facility]. Will D/C the Pt with antibiotics and pain medication.     INCISION AND DRAINAGE Performed by: [name], PA-C Consent: Verbal consent obtained. Risks and benefits: risks, benefits and alternatives were discussed Type: abscess  Body area: Left gluteal Cheek  Anesthesia: local infiltration  Incision was made with a  scalpel  Local anesthetic: lidocaine 2% with epinephrine  Anesthetic total: 5 ml  Complexity: complex Blunt dissection to break up loculations  Drainage: purulent  Drainage amount: 15 ml  Packing material: no packing required  Patient tolerance: Patient tolerated the procedure well with no immediate complications.        1. Abscess       MDM  47 year old female with abscess.  I&D'd  by me, patient tolerated the procedure well.  Will discharge with abx and pain meds.  Abscess was well defined, and localized.  Doubt any deep tissue involvement, however, due the patient's history and the location, will refer to surgery.  Patient understands and agrees with the plan.      I personally performed the services described in this documentation, which was scribed in my presence. The recorded information has been reviewed and is accurate.     [name], PA-C 03/10/12 1546

## 2012-03-10 NOTE — ED Notes (Signed)
Pt c/o abscess on left inner buttock x 1 week.

## 2012-03-10 NOTE — ED Provider Notes (Signed)
Medical screening examination/treatment/procedure(s) were performed by non-physician practitioner and as supervising physician I was immediately available for consultation/collaboration.    [name], MD 03/10/12 [phone]

## 2012-03-10 NOTE — ED Notes (Signed)
I & D performed by Cleone Slim, PA

## 2012-03-14 ENCOUNTER — Ambulatory Visit (INDEPENDENT_AMBULATORY_CARE_PROVIDER_SITE_OTHER): Payer: Medicaid Other | Admitting: Surgery

## 2012-03-14 ENCOUNTER — Encounter (INDEPENDENT_AMBULATORY_CARE_PROVIDER_SITE_OTHER): Payer: Self-pay | Admitting: Surgery

## 2012-03-14 VITALS — BP 144/86 | HR 74 | Temp 97.9°F | Resp 16 | Ht 67.5 in | Wt 233.0 lb

## 2012-03-14 DIAGNOSIS — K611 Rectal abscess: Secondary | ICD-10-CM | POA: Insufficient documentation

## 2012-03-14 DIAGNOSIS — K612 Anorectal abscess: Secondary | ICD-10-CM

## 2012-03-14 MED ORDER — OXYCODONE-ACETAMINOPHEN 5-325 MG PO TABS: 1.0000 | ORAL_TABLET | ORAL | Status: DC | PRN

## 2012-03-14 NOTE — Progress Notes (Signed)
The patient had drainage of a pararectal abscess on 03/10/12 in the emergency department. She is on Bactrim. She is still having a lot of pain. On examination she is afebrile. The opening is about a centimeter across. No erythema is noted. Minimal drainage. She does have some induration heading from this left perirectal region heading towards the anus. However this area seems to be adequately drained.  Continue with antibiotics and sitz baths for now. Follow up in one week for recheck.  Percocet rx  Wilmon Arms. Corliss Skains, MD, [facility]  03/14/2012 2:53 PM

## 2012-03-21 ENCOUNTER — Encounter (INDEPENDENT_AMBULATORY_CARE_PROVIDER_SITE_OTHER): Payer: Medicaid Other | Admitting: Surgery

## 2012-03-28 ENCOUNTER — Ambulatory Visit
Admission: RE | Admit: 2012-03-28 | Discharge: 2012-03-28 | Disposition: A | Discharge status: Home / Self Care | Payer: Medicaid Other | Source: Ambulatory Visit | Attending: Internal Medicine | Admitting: Internal Medicine

## 2012-03-28 ENCOUNTER — Ambulatory Visit: Payer: Medicaid Other

## 2012-03-28 DIAGNOSIS — Z1231 Encounter for screening mammogram for malignant neoplasm of breast: Secondary | ICD-10-CM

## 2012-03-30 ENCOUNTER — Other Ambulatory Visit: Payer: Self-pay | Admitting: Otolaryngology

## 2012-05-01 ENCOUNTER — Telehealth (INDEPENDENT_AMBULATORY_CARE_PROVIDER_SITE_OTHER): Payer: Self-pay | Admitting: General Surgery

## 2012-05-01 ENCOUNTER — Encounter (INDEPENDENT_AMBULATORY_CARE_PROVIDER_SITE_OTHER): Payer: Self-pay | Admitting: General Surgery

## 2012-05-01 NOTE — Telephone Encounter (Signed)
Pt called to make an appt for a new problem.  She was released from care of perirectal abscess at 03/14/12 office visit with Dr. Corliss Skains.  Now she has a swollen and painful Lt nipple and is requesting to be seen again by Dr. Corliss Skains.  Explained to her that her Medicaid insurance requires a referral be make by her PCP before we can schedule her appt.  She understands.

## 2012-05-05 ENCOUNTER — Telehealth (INDEPENDENT_AMBULATORY_CARE_PROVIDER_SITE_OTHER): Payer: Self-pay | Admitting: General Surgery

## 2012-05-05 NOTE — Telephone Encounter (Signed)
Called in Rx into the [facility] pharmacy [phone] called in doxycyline 100 mg bid #20 with out refill

## 2012-05-08 ENCOUNTER — Encounter (HOSPITAL_COMMUNITY): Payer: Self-pay

## 2012-05-08 ENCOUNTER — Encounter (HOSPITAL_COMMUNITY)
Admission: RE | Admit: 2012-05-08 | Discharge: 2012-05-08 | Disposition: A | Discharge status: Home / Self Care | Payer: Medicaid Other | Source: Ambulatory Visit | Attending: Surgery | Admitting: Surgery

## 2012-05-08 ENCOUNTER — Encounter (INDEPENDENT_AMBULATORY_CARE_PROVIDER_SITE_OTHER): Payer: Self-pay | Admitting: Surgery

## 2012-05-08 ENCOUNTER — Ambulatory Visit (INDEPENDENT_AMBULATORY_CARE_PROVIDER_SITE_OTHER): Payer: Medicaid Other | Admitting: Surgery

## 2012-05-08 VITALS — BP 129/78 | HR 74 | Temp 97.7°F | Resp 14 | Ht 67.5 in | Wt 242.4 lb

## 2012-05-08 DIAGNOSIS — N611 Abscess of the breast and nipple: Secondary | ICD-10-CM

## 2012-05-08 DIAGNOSIS — N61 Mastitis without abscess: Secondary | ICD-10-CM

## 2012-05-08 LAB — BASIC METABOLIC PANEL
BUN: 10 mg/dL (ref 6–23)
CO2: 25 mEq/L (ref 19–32)
Chloride: 102 mEq/L (ref 96–112)
Creatinine, Ser: 0.58 mg/dL (ref 0.50–1.10)
Glucose, Bld: 314 mg/dL — ABNORMAL HIGH (ref 70–99)

## 2012-05-08 LAB — CBC
HCT: 36.8 % (ref 36.0–46.0)
MCHC: 35.6 g/dL (ref 30.0–36.0)
MCV: 82.3 fL (ref 78.0–100.0)
RDW: 13.1 % (ref 11.5–15.5)
WBC: 9.3 10*3/uL (ref 4.0–10.5)

## 2012-05-08 LAB — SURGICAL PCR SCREEN: Staphylococcus aureus: NEGATIVE

## 2012-05-08 MED ORDER — DEXTROSE 5 % IV SOLN
2.0000 g | INTRAVENOUS | Status: AC
  Administered 2012-05-09: 2 g via INTRAVENOUS

## 2012-05-08 NOTE — Pre-Procedure Instructions (Signed)
Cassidy [name]  05/08/2012   Your procedure is scheduled on:  05-09-2012  Report to [facility] Stay [facility] at 5:30 AM.  Call this number if you have problems the morning of surgery: [phone]   Remember:   Do not eat food or drink liquids after midnight.   Take these medicines the morning of surgery with A SIP OF WATER: wellbutrin,enablex,paxil,pain medication as needed   Do not wear jewelry, make-up or nail polish.  Do not wear lotions, powders, or perfumes. You may wear deodorant.  Do not shave 48 hours prior to surgery.  Do not bring valuables to the hospital.  Contacts, dentures or bridgework may not be worn into surgery.      Patients discharged the day of surgery will not be allowed to drive home.   Name and phone number of your driver:____________________________________________________________   _ Special Instructions: Shower tonight and in the morning using CHG         Please read over the following fact sheets that you were given: Pain Booklet and Surgical Site Infection Prevention

## 2012-05-08 NOTE — Progress Notes (Signed)
HPI  Cassidy [name] is a 47 y.o. female.  HPI  The patient comes in for recheck of her right breast abscess. The patient has had multiple surgeries to drain this breast abscess.She had been doing quite well over the several months, except for a recent perirectal abscess.  This has now healed.  Last week, she began having some tenderness and swelling near her right nipple, which has become quite tender.  She is very tender in her right axilla as well.  She denies any fever.  Past Medical History  Diagnosis Date  . Hypertension  . Depression  . GERD (gastroesophageal reflux disease)  . Breast abscess  recurrent; on right  . High cholesterol  . Type II diabetes mellitus  . Anemia  . Sickle cell trait  . Fibromyalgia  . Bipolar depression  . PTSD (post-traumatic stress disorder)  . Genital warts   Past Surgical History  Procedure Date  . Bladder surgery 1980  . Irrigation and debridement abscess 12/19/2010  Procedure: IRRIGATION AND DEBRIDEMENT ABSCESS; Surgeon: Shawny Borkowski Wakefield, MD; Location: [facility] OR; Service: General; Laterality: Right;  . Incise and drain abcess ; 04/26/11; 08/13/11  right breast  . Breast surgery  I&D abscess; right nipple area  . Tonsillectomy 1988  . Eye surgery  . Refractive surgery ~ 2010  right  . Irrigation and debridement abscess 08/13/2011  Procedure: MINOR INCISION AND DRAINAGE OF ABSCESS; Surgeon: James O Wyatt, MD; Location: [facility] OR; Service: General; Laterality: Right; Right Breast  Incision and drainage of perirectal abscess 03/10/12 in the ED  Family History  Problem Relation Age of Onset  . COPD Mother  . Cancer Mother  lymphoma  . Cancer Maternal Aunt  breast, colon  Social History  History  Substance Use Topics  . Smoking status: Current Everyday Smoker -- 0.2 packs/day for 30 years  Types: Cigarettes  . Smokeless tobacco: Never Used  Comment: 08/14/11 "quit for 3 wk 05/2011; was smoking 1 ppd since age 14 before I quit; at least I've cut down  to 1/4 ppd"  . 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 buPROPion (WELLBUTRIN SR) 150 MG 12 hr tablet Take 150 mg by mouth daily.  . darifenacin (ENABLEX) 15 MG 24 hr tablet Take 15 mg by mouth daily.  . divalproex (DEPAKOTE ER) 500 MG 24 hr tablet Take 500 mg by mouth at bedtime.  . exenatide (BYETTA) 5 MCG/0.02ML SOLN Inject 5 mcg into the skin 2 (two) times daily with a meal.  . lisinopril (PRINIVIL,ZESTRIL) 10 MG tablet Take 10 mg by mouth daily.  . metFORMIN (GLUCOPHAGE) 500 MG tablet Take 500 mg by mouth 2 (two) times daily with a meal.  . PARoxetine (PAXIL-CR) 37.5 MG 24 hr tablet Take 37.5 mg by mouth every morning.  . Polyethyl Glycol-Propyl Glycol (SYSTANE) 0.4-0.3 % SOLN Apply 2 drops to eye daily as needed. For dry eyes  . sulfamethoxazole-trimethoprim (BACTRIM DS) 800-160 MG per tablet Take 1 tablet by mouth 2 (two) times daily. X 10 days. Started on 08/09/11.  . doxycycline (VIBRAMYCIN) 100 MG capsule Take 1 capsule (100 mg total) by mouth 2 (two) times daily. 28 capsule 0  . oxyCODONE-acetaminophen (PERCOCET/ROXICET) 5-325 MG per tablet Take 1 tablet by mouth every 4 (four) hours as needed for pain. 40 tablet 0  Review of Systems  Filed Vitals:   05/08/12 1029  BP: 129/78    Pulse: 74  Temp: 97.7 F (36.5 C)  Resp: 14                   Physical Exam  Her right breast shows that all of the incisions are well healed around her areola. The upper part of her areola is contracted. Immediately adjacent to the scar, there is a 2 cm fluctuant, very tender area.  This does not seem to track very deeply, but is confined to the immediate subcutaneous space   Assessment  Impression: Recurrent breast abscess, right-unclear etiology   Plan: Incision and drainage of recurrent right breast abscess. The surgical procedure has been discussed with  the patient. Potential risks, benefits, alternative treatments, and expected outcomes have been explained. All of the patient's questions at this time have been answered. The likelihood of reaching the patient's treatment goal is good. The patient understand the proposed surgical procedure and wishes to proceed.   Rebbecca Osuna K. Quincey Quesinberry, MD, FACS [facility]  05/08/2012 12:20 PM   

## 2012-05-08 NOTE — Progress Notes (Signed)
Requested Sleep Study,ECHO and ov from [facility].

## 2012-05-09 ENCOUNTER — Ambulatory Visit (HOSPITAL_COMMUNITY)
Admission: RE | Admit: 2012-05-09 | Discharge: 2012-05-09 | Disposition: A | Discharge status: Home / Self Care | Payer: Medicaid Other | Source: Ambulatory Visit | Attending: Surgery | Admitting: Surgery

## 2012-05-09 ENCOUNTER — Encounter (HOSPITAL_COMMUNITY): Payer: Self-pay | Admitting: Anesthesiology

## 2012-05-09 ENCOUNTER — Ambulatory Visit (HOSPITAL_COMMUNITY): Payer: Medicaid Other | Admitting: Anesthesiology

## 2012-05-09 ENCOUNTER — Encounter (HOSPITAL_COMMUNITY)
Admission: RE | Disposition: A | Discharge status: Home / Self Care | Payer: Self-pay | Source: Ambulatory Visit | Attending: Surgery

## 2012-05-09 DIAGNOSIS — F3130 Bipolar disorder, current episode depressed, mild or moderate severity, unspecified: Secondary | ICD-10-CM | POA: Insufficient documentation

## 2012-05-09 DIAGNOSIS — Z79899 Other long term (current) drug therapy: Secondary | ICD-10-CM | POA: Insufficient documentation

## 2012-05-09 DIAGNOSIS — E119 Type 2 diabetes mellitus without complications: Secondary | ICD-10-CM | POA: Insufficient documentation

## 2012-05-09 DIAGNOSIS — D649 Anemia, unspecified: Secondary | ICD-10-CM | POA: Insufficient documentation

## 2012-05-09 DIAGNOSIS — N61 Mastitis without abscess: Secondary | ICD-10-CM

## 2012-05-09 DIAGNOSIS — N611 Abscess of the breast and nipple: Secondary | ICD-10-CM

## 2012-05-09 DIAGNOSIS — IMO0001 Reserved for inherently not codable concepts without codable children: Secondary | ICD-10-CM | POA: Insufficient documentation

## 2012-05-09 DIAGNOSIS — F17200 Nicotine dependence, unspecified, uncomplicated: Secondary | ICD-10-CM | POA: Insufficient documentation

## 2012-05-09 DIAGNOSIS — G709 Myoneural disorder, unspecified: Secondary | ICD-10-CM | POA: Insufficient documentation

## 2012-05-09 DIAGNOSIS — D573 Sickle-cell trait: Secondary | ICD-10-CM | POA: Insufficient documentation

## 2012-05-09 DIAGNOSIS — J45909 Unspecified asthma, uncomplicated: Secondary | ICD-10-CM | POA: Insufficient documentation

## 2012-05-09 DIAGNOSIS — E7800 Pure hypercholesterolemia, unspecified: Secondary | ICD-10-CM | POA: Insufficient documentation

## 2012-05-09 DIAGNOSIS — I10 Essential (primary) hypertension: Secondary | ICD-10-CM | POA: Insufficient documentation

## 2012-05-09 DIAGNOSIS — K219 Gastro-esophageal reflux disease without esophagitis: Secondary | ICD-10-CM | POA: Insufficient documentation

## 2012-05-09 DIAGNOSIS — F4310 Post-traumatic stress disorder, unspecified: Secondary | ICD-10-CM | POA: Insufficient documentation

## 2012-05-09 HISTORY — DX: Dental caries, unspecified: K02.9

## 2012-05-09 HISTORY — PX: INCISION AND DRAINAGE ABSCESS: SHX5864

## 2012-05-09 LAB — GLUCOSE, CAPILLARY: Glucose-Capillary: 200 mg/dL — ABNORMAL HIGH (ref 70–99)

## 2012-05-09 SURGERY — INCISION AND DRAINAGE, ABSCESS
Anesthesia: General | Laterality: Right | Wound class: Dirty or Infected

## 2012-05-09 MED ORDER — OXYCODONE-ACETAMINOPHEN 5-325 MG PO TABS: 1.0000 | ORAL_TABLET | ORAL | Status: DC | PRN

## 2012-05-09 MED ORDER — HYDROMORPHONE HCL PF 1 MG/ML IJ SOLN
INTRAMUSCULAR | Status: AC
  Filled 2012-05-09: qty 1

## 2012-05-09 MED ORDER — SUFENTANIL CITRATE 50 MCG/ML IV SOLN
INTRAVENOUS | Status: DC | PRN
  Administered 2012-05-09: 10 ug via INTRAVENOUS

## 2012-05-09 MED ORDER — MIDAZOLAM HCL 5 MG/5ML IJ SOLN
INTRAMUSCULAR | Status: DC | PRN
  Administered 2012-05-09: 2 mg via INTRAVENOUS

## 2012-05-09 MED ORDER — PROPOFOL 10 MG/ML IV BOLUS
INTRAVENOUS | Status: DC | PRN
  Administered 2012-05-09: 190 mg via INTRAVENOUS

## 2012-05-09 MED ORDER — BUPIVACAINE HCL (PF) 0.25 % IJ SOLN
INTRAMUSCULAR | Status: AC
  Filled 2012-05-09: qty 30

## 2012-05-09 MED ORDER — CHLORHEXIDINE GLUCONATE 4 % EX LIQD: 1.0000 "application " | Freq: Once | CUTANEOUS | Status: DC

## 2012-05-09 MED ORDER — OXYCODONE HCL 5 MG/5ML PO SOLN: 5.0000 mg | Freq: Once | ORAL | Status: DC | PRN

## 2012-05-09 MED ORDER — MORPHINE SULFATE 2 MG/ML IJ SOLN: 2.0000 mg | INTRAMUSCULAR | Status: DC | PRN

## 2012-05-09 MED ORDER — CEFAZOLIN SODIUM-DEXTROSE 2-3 GM-% IV SOLR
INTRAVENOUS | Status: AC
  Filled 2012-05-09: qty 50

## 2012-05-09 MED ORDER — LIDOCAINE HCL (CARDIAC) 20 MG/ML IV SOLN
INTRAVENOUS | Status: DC | PRN
  Administered 2012-05-09: 70 mg via INTRAVENOUS

## 2012-05-09 MED ORDER — LACTATED RINGERS IV SOLN
INTRAVENOUS | Status: DC | PRN
  Administered 2012-05-09: 07:00:00 via INTRAVENOUS

## 2012-05-09 MED ORDER — METOCLOPRAMIDE HCL 5 MG/ML IJ SOLN: 10.0000 mg | Freq: Once | INTRAMUSCULAR | Status: DC | PRN

## 2012-05-09 MED ORDER — OXYCODONE HCL 5 MG PO TABS: 5.0000 mg | ORAL_TABLET | Freq: Once | ORAL | Status: DC | PRN

## 2012-05-09 MED ORDER — ONDANSETRON HCL 4 MG/2ML IJ SOLN: 4.0000 mg | INTRAMUSCULAR | Status: DC | PRN

## 2012-05-09 MED ORDER — BUPIVACAINE HCL (PF) 0.25 % IJ SOLN
INTRAMUSCULAR | Status: DC | PRN
  Administered 2012-05-09: 10 mL

## 2012-05-09 MED ORDER — HYDROMORPHONE HCL PF 1 MG/ML IJ SOLN
0.2500 mg | INTRAMUSCULAR | Status: DC | PRN
  Administered 2012-05-09 (×4): 0.5 mg via INTRAVENOUS

## 2012-05-09 MED ORDER — OXYCODONE-ACETAMINOPHEN 5-325 MG PO TABS
1.0000 | ORAL_TABLET | ORAL | Status: DC | PRN
Start: 2012-05-09 — End: 2012-05-09

## 2012-05-09 SURGICAL SUPPLY — 32 items
BLADE SURG 15 STRL LF DISP TIS (BLADE) ×1 IMPLANT
BLADE SURG 15 STRL SS (BLADE)
BLADE SURG ROTATE 9660 (MISCELLANEOUS) IMPLANT
CANISTER SUCTION 2500CC (MISCELLANEOUS) ×2 IMPLANT
CLOTH BEACON ORANGE TIMEOUT ST (SAFETY) ×2 IMPLANT
COVER SURGICAL LIGHT HANDLE (MISCELLANEOUS) ×2 IMPLANT
DRAIN PENROSE 1/2X12 LTX STRL (WOUND CARE) ×1 IMPLANT
DRAPE PED LAPAROTOMY (DRAPES) ×1 IMPLANT
ELECT REM PT RETURN 9FT ADLT (ELECTROSURGICAL) ×2
ELECTRODE REM PT RTRN 9FT ADLT (ELECTROSURGICAL) ×1 IMPLANT
GAUZE SPONGE 4X4 16PLY XRAY LF (GAUZE/BANDAGES/DRESSINGS) ×1 IMPLANT
GLOVE BIO SURGEON STRL SZ7 (GLOVE) ×3 IMPLANT
GLOVE BIO SURGEON STRL SZ7.5 (GLOVE) ×1 IMPLANT
GLOVE BIOGEL PI IND STRL 7.5 (GLOVE) ×1 IMPLANT
GLOVE BIOGEL PI INDICATOR 7.5 (GLOVE) ×1
GOWN STRL NON-REIN LRG LVL3 (GOWN DISPOSABLE) ×4 IMPLANT
KIT BASIN OR (CUSTOM PROCEDURE TRAY) ×2 IMPLANT
KIT ROOM TURNOVER OR (KITS) ×2 IMPLANT
NS IRRIG 1000ML POUR BTL (IV SOLUTION) ×2 IMPLANT
PACK GENERAL/GYN (CUSTOM PROCEDURE TRAY) ×1 IMPLANT
PACK LITHOTOMY IV (CUSTOM PROCEDURE TRAY) ×1 IMPLANT
PAD ARMBOARD 7.5X6 YLW CONV (MISCELLANEOUS) ×4 IMPLANT
SPONGE GAUZE 4X4 12PLY (GAUZE/BANDAGES/DRESSINGS) ×2 IMPLANT
SPONGE LAP 18X18 X RAY DECT (DISPOSABLE) IMPLANT
SUT ETHILON 2 0 FS 18 (SUTURE) ×1 IMPLANT
SYR CONTROL 10ML LL (SYRINGE) ×1 IMPLANT
TAPE CLOTH SURG 4X10 WHT LF (GAUZE/BANDAGES/DRESSINGS) ×1 IMPLANT
TOWEL OR 17X24 6PK STRL BLUE (TOWEL DISPOSABLE) ×2 IMPLANT
TOWEL OR 17X26 10 PK STRL BLUE (TOWEL DISPOSABLE) ×2 IMPLANT
TUBE CONNECTING 12X1/4 (SUCTIONS) ×1 IMPLANT
WATER STERILE IRR 1000ML POUR (IV SOLUTION) ×2 IMPLANT
YANKAUER SUCT BULB TIP NO VENT (SUCTIONS) ×1 IMPLANT

## 2012-05-09 NOTE — Interval H&P Note (Signed)
History and Physical Interval Note:  05/09/2012 7:23 AM  Cassidy [name]  has presented today for surgery, with the diagnosis of right breast abscess  The various methods of treatment have been discussed with the patient and family. After consideration of risks, benefits and other options for treatment, the patient has consented to  Procedure(s): INCISION AND DRAINAGE RIGHT BREAST ABSCESS (Right) as a surgical intervention .  The patient's history has been reviewed, patient examined, no change in status, stable for surgery.  I have reviewed the patient's chart and labs.  Questions were answered to the patient's satisfaction.     Alistar Mcenery K.

## 2012-05-09 NOTE — Anesthesia Procedure Notes (Signed)
Procedure Name: LMA Insertion Date/Time: 05/09/2012 7:36 AM Performed by: Charm Barges, Mirtha Jain R Pre-anesthesia Checklist: Patient identified, Emergency Drugs available, Suction available, Patient being monitored and Timeout performed Patient Re-evaluated:Patient Re-evaluated prior to inductionOxygen Delivery Method: Circle system utilized Preoxygenation: Pre-oxygenation with 100% oxygen Intubation Type: IV induction LMA: LMA inserted LMA Size: 5.0 Number of attempts: 1 Placement Confirmation: positive ETCO2 and breath sounds checked- equal and bilateral Tube secured with: Tape Dental Injury: Teeth and Oropharynx as per pre-operative assessment

## 2012-05-09 NOTE — Preoperative (Signed)
Beta Blockers   Reason not to administer Beta Blockers:Not Applicable 

## 2012-05-09 NOTE — Transfer of Care (Signed)
Immediate Anesthesia Transfer of Care Note  Patient: Cassidy [name]  Procedure(s) Performed: Procedure(s): INCISION AND DRAINAGE RIGHT BREAST ABSCESS (Right)  Patient Location: PACU  Anesthesia Type:General  Level of Consciousness: awake, alert  and oriented  Airway & Oxygen Therapy: Patient Spontanous Breathing and Patient connected to nasal cannula oxygen  Post-op Assessment: Report given to PACU RN, Post -op Vital signs reviewed and stable and Patient moving all extremities  Post vital signs: Reviewed and stable  Complications: No apparent anesthesia complications

## 2012-05-09 NOTE — Anesthesia Postprocedure Evaluation (Signed)
Anesthesia Post Note  Patient: Cassidy [name]  Procedure(s) Performed: Procedure(s) (LRB): INCISION AND DRAINAGE RIGHT BREAST ABSCESS (Right)  Anesthesia type: General  Patient location: PACU  Post pain: Pain level controlled  Post assessment: Patient's Cardiovascular Status Stable  Last Vitals:  Filed Vitals:   05/09/12 0815  BP: 127/74  Pulse: 89  Temp: 36.8 C  Resp: 13    Post vital signs: Reviewed and stable  Level of consciousness: alert  Complications: No apparent anesthesia complications

## 2012-05-09 NOTE — Op Note (Signed)
Pre-op Diagnosis - right breast abscess Post-op diagnosis - same Procedure - Incision and drainage of right breast abscess Surgeon - Demetria Iwai Anesthesia - Gen - LMA Indications - 47 yo female with multiple previous right breast abscesses presents with a small recurrent abscess beneath the right nipple.  She comes to the OR today for incision and drainage.  Description of procedure - the patient was brought to the operating room and placed in a supine position on the operating room table.  After an adequate level of anesthesia was obtained, the patient's right breast was prepped with Chloraprep and draped in sterile fashion.  A time out was taken.  I incised her old incision and raised skin flaps in both directions.  We dissected into a 2 cm abscess cavity.  The fluid was sent for microbiology.  I excised part of the wall of the abscess cavity.  A 1/2 inch Penrose drain was placed in the abscess cavity and sutured in place with 2-0 Ethilon.  A dry dressing was applied.  The patient was extubated and brought to the recovery room in stable condition.  All counts were correct.  Wilmon Arms. Corliss Skains, MD, [facility]  05/09/2012 8:11 AM

## 2012-05-09 NOTE — H&P (View-Only) (Signed)
HPI  Cassidy CROCKETT is a 47 y.o. female.  HPI  The patient comes in for recheck of her right breast abscess. The patient has had multiple surgeries to drain this breast abscess.She had been doing quite well over the several months, except for a recent perirectal abscess.  This has now healed.  Last week, she began having some tenderness and swelling near her right nipple, which has become quite tender.  She is very tender in her right axilla as well.  She denies any fever.  Past Medical History  Diagnosis Date  . Hypertension  . Depression  . GERD (gastroesophageal reflux disease)  . Breast abscess  recurrent; on right  . High cholesterol  . Type II diabetes mellitus  . Anemia  . Sickle cell trait  . Fibromyalgia  . Bipolar depression  . PTSD (post-traumatic stress disorder)  . Genital warts   Past Surgical History  Procedure Date  . Bladder surgery 1980  . Irrigation and debridement abscess 12/19/2010  Procedure: IRRIGATION AND DEBRIDEMENT ABSCESS; Surgeon: Emelia Loron, MD; Location: [facility] OR; Service: General; Laterality: Right;  . Incise and drain abcess ; 04/26/11; 08/13/11  right breast  . Breast surgery  I&D abscess; right nipple area  . Tonsillectomy 1988  . Eye surgery  . Refractive surgery ~ 2010  right  . Irrigation and debridement abscess 08/13/2011  Procedure: MINOR INCISION AND DRAINAGE OF ABSCESS; Surgeon: Cherylynn Ridges, MD; Location: [facility] OR; Service: General; Laterality: Right; Right Breast  Incision and drainage of perirectal abscess 03/10/12 in the ED  Family History  Problem Relation Age of Onset  . COPD Mother  . Cancer Mother  lymphoma  . Cancer Maternal Aunt  breast, colon  Social History  History  Substance Use Topics  . Smoking status: Current Everyday Smoker -- 0.2 packs/day for 30 years  Types: Cigarettes  . Smokeless tobacco: Never Used  Comment: 08/14/11 "quit for 3 wk 05/2011; was smoking 1 ppd since age 59 before I quit; at least I've cut down  to 1/4 ppd"  . 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 buPROPion (WELLBUTRIN SR) 150 MG 12 hr tablet Take 150 mg by mouth daily.  Marland Kitchen darifenacin (ENABLEX) 15 MG 24 hr tablet Take 15 mg by mouth daily.  . divalproex (DEPAKOTE ER) 500 MG 24 hr tablet Take 500 mg by mouth at bedtime.  Marland Kitchen exenatide (BYETTA) 5 MCG/0.02ML SOLN Inject 5 mcg into the skin 2 (two) times daily with a meal.  . lisinopril (PRINIVIL,ZESTRIL) 10 MG tablet Take 10 mg by mouth daily.  . metFORMIN (GLUCOPHAGE) 500 MG tablet Take 500 mg by mouth 2 (two) times daily with a meal.  . PARoxetine (PAXIL-CR) 37.5 MG 24 hr tablet Take 37.5 mg by mouth every morning.  Bertram Gala Glycol-Propyl Glycol (SYSTANE) 0.4-0.3 % SOLN Apply 2 drops to eye daily as needed. For dry eyes  . sulfamethoxazole-trimethoprim (BACTRIM DS) 800-160 MG per tablet Take 1 tablet by mouth 2 (two) times daily. X 10 days. Started on 08/09/11.  Marland Kitchen doxycycline (VIBRAMYCIN) 100 MG capsule Take 1 capsule (100 mg total) by mouth 2 (two) times daily. 28 capsule 0  . oxyCODONE-acetaminophen (PERCOCET/ROXICET) 5-325 MG per tablet Take 1 tablet by mouth every 4 (four) hours as needed for pain. 40 tablet 0  Review of Systems  Filed Vitals:   05/08/12 1029  BP: 129/78  Pulse: 74  Temp: 97.7 F (36.5 C)  Resp: 14                   Physical Exam  Her right breast shows that all of the incisions are well healed around her areola. The upper part of her areola is contracted. Immediately adjacent to the scar, there is a 2 cm fluctuant, very tender area.  This does not seem to track very deeply, but is confined to the immediate subcutaneous space   Assessment  Impression: Recurrent breast abscess, right-unclear etiology   Plan: Incision and drainage of recurrent right breast abscess. The surgical procedure has been discussed with  the patient. Potential risks, benefits, alternative treatments, and expected outcomes have been explained. All of the patient's questions at this time have been answered. The likelihood of reaching the patient's treatment goal is good. The patient understand the proposed surgical procedure and wishes to proceed.   Wilmon Arms. Corliss Skains, MD, [facility]  05/08/2012 12:20 PM

## 2012-05-09 NOTE — Anesthesia Preprocedure Evaluation (Signed)
Anesthesia Evaluation  Patient identified by MRN, date of birth, ID band Patient awake    Reviewed: Allergy & Precautions, H&P , NPO status , Patient's Chart, lab work & pertinent test results, reviewed documented beta blocker date and time   Airway Mallampati: II TM Distance: >3 FB Neck ROM: full    Dental   Pulmonary asthma ,  breath sounds clear to auscultation        Cardiovascular hypertension, On Medications Rhythm:regular     Neuro/Psych PSYCHIATRIC DISORDERS  Neuromuscular disease    GI/Hepatic Neg liver ROS, GERD-  Medicated and Controlled,  Endo/Other  diabetes, Oral Hypoglycemic Agents  Renal/GU negative Renal ROS  negative genitourinary   Musculoskeletal   Abdominal   Peds  Hematology  (+) Sickle cell trait and anemia ,   Anesthesia Other Findings See surgeon's H&P   Reproductive/Obstetrics negative OB ROS                           Anesthesia Physical Anesthesia Plan  ASA: III  Anesthesia Plan: General   Post-op Pain Management:    Induction: Intravenous  Airway Management Planned: LMA  Additional Equipment:   Intra-op Plan:   Post-operative Plan: Extubation in OR  Informed Consent: I have reviewed the patients History and Physical, chart, labs and discussed the procedure including the risks, benefits and alternatives for the proposed anesthesia with the patient or authorized representative who has indicated his/her understanding and acceptance.   Dental Advisory Given  Plan Discussed with: CRNA and Surgeon  Anesthesia Plan Comments:         Anesthesia Quick Evaluation

## 2012-05-10 ENCOUNTER — Encounter (HOSPITAL_COMMUNITY): Payer: Self-pay | Admitting: Surgery

## 2012-05-11 LAB — CULTURE, ROUTINE-ABSCESS

## 2012-05-14 LAB — ANAEROBIC CULTURE

## 2012-05-16 ENCOUNTER — Ambulatory Visit (INDEPENDENT_AMBULATORY_CARE_PROVIDER_SITE_OTHER): Payer: Medicaid Other | Admitting: General Surgery

## 2012-05-16 DIAGNOSIS — Z4889 Encounter for other specified surgical aftercare: Secondary | ICD-10-CM

## 2012-05-16 DIAGNOSIS — Z5189 Encounter for other specified aftercare: Secondary | ICD-10-CM

## 2012-05-16 NOTE — Progress Notes (Signed)
Subjective:     Patient ID: Cassidy [name], female   DOB: 01/20/1965, 47 y.o.   MRN: [id number]  HPI This patient follows up status post incision and drainage of recurrent right breast abscess approximately one week ago by Dr. Corliss Skains.  She's been doing fine denies any fevers or chills and has minimal discomfort in the area. She comes in today for a nurse check of the wound and for possible removal of her Penrose drain. She says that she has some milky discharge from the wound.  Review of Systems     Objective:   Physical Exam She is in no acute distress and nontoxic-appearing sitting comfortably on the edges of the bed Her right breast has a proximally 1 inch incision at the border of the nipple areolar complex with a Penrose drain in place. I probed the wound which is still open and it appears that the tract is closing down around the drain.  I do not see any erythema or induration.    Assessment:     Status post incision and drainage of right breast abscess-recurrent I think that the incision looks fine and is well drained. I think that if we removed the drain today she would still require some wound packing. I offered to remove it intact the wound or she can continue with the Penrose drain and followup in another few days with her surgeon for repeat evaluation. We will go ahead and keep the Penrose drains in the wound for now and she will followup with Dr. Corliss Skains in another few days for  Wound check.     Plan:     Continue current wound care and follow up with her surgeon in a few days.

## 2012-05-23 ENCOUNTER — Encounter (INDEPENDENT_AMBULATORY_CARE_PROVIDER_SITE_OTHER): Payer: Self-pay | Admitting: Surgery

## 2012-05-23 ENCOUNTER — Ambulatory Visit (INDEPENDENT_AMBULATORY_CARE_PROVIDER_SITE_OTHER): Payer: Medicaid Other | Admitting: Surgery

## 2012-05-23 VITALS — BP 143/86 | HR 66 | Temp 98.0°F | Resp 16 | Ht 67.5 in | Wt 239.8 lb

## 2012-05-23 DIAGNOSIS — N61 Mastitis without abscess: Secondary | ICD-10-CM

## 2012-05-23 DIAGNOSIS — N611 Abscess of the breast and nipple: Secondary | ICD-10-CM

## 2012-05-23 MED ORDER — OXYCODONE-ACETAMINOPHEN 5-325 MG PO TABS: 1.0000 | ORAL_TABLET | ORAL | Status: DC | PRN

## 2012-05-23 NOTE — Progress Notes (Signed)
Patient is here for wound check of a right breast abscess. It seems like the wound has granulated in and around her Penrose drain. There is a little bit of purulent drainage. I removed the Penrose drain and packed a 2 x 2 gauze into the tunnel. She will continue with daily wet-to-dry dressings to this area. Recheck in 2 weeks. I refilled her pain medicine prescription. No need for further antibiotics.  Wilmon Arms. Corliss Skains, MD, [facility]  General/ Trauma Surgery  05/23/2012 3:03 PM

## 2012-05-26 ENCOUNTER — Encounter (INDEPENDENT_AMBULATORY_CARE_PROVIDER_SITE_OTHER): Payer: Medicaid Other | Admitting: Surgery

## 2012-05-26 ENCOUNTER — Inpatient Hospital Stay (HOSPITAL_COMMUNITY)
Admission: EM | Admit: 2012-05-26 | Discharge: 2012-05-30 | DRG: 251 | Disposition: A | Discharge status: Home / Self Care | Payer: Medicaid Other | Attending: Internal Medicine | Admitting: Internal Medicine

## 2012-05-26 ENCOUNTER — Telehealth: Payer: Self-pay | Admitting: Internal Medicine

## 2012-05-26 ENCOUNTER — Emergency Department (HOSPITAL_COMMUNITY): Payer: Medicaid Other

## 2012-05-26 ENCOUNTER — Encounter (HOSPITAL_COMMUNITY): Payer: Self-pay | Admitting: Family Medicine

## 2012-05-26 ENCOUNTER — Observation Stay (HOSPITAL_COMMUNITY): Payer: Medicaid Other

## 2012-05-26 DIAGNOSIS — M542 Cervicalgia: Secondary | ICD-10-CM

## 2012-05-26 DIAGNOSIS — I209 Angina pectoris, unspecified: Secondary | ICD-10-CM | POA: Diagnosis present

## 2012-05-26 DIAGNOSIS — E785 Hyperlipidemia, unspecified: Secondary | ICD-10-CM

## 2012-05-26 DIAGNOSIS — K5900 Constipation, unspecified: Secondary | ICD-10-CM | POA: Diagnosis not present

## 2012-05-26 DIAGNOSIS — IMO0001 Reserved for inherently not codable concepts without codable children: Secondary | ICD-10-CM | POA: Diagnosis present

## 2012-05-26 DIAGNOSIS — Z720 Tobacco use: Secondary | ICD-10-CM

## 2012-05-26 DIAGNOSIS — J309 Allergic rhinitis, unspecified: Secondary | ICD-10-CM

## 2012-05-26 DIAGNOSIS — R1906 Epigastric swelling, mass or lump: Secondary | ICD-10-CM

## 2012-05-26 DIAGNOSIS — K219 Gastro-esophageal reflux disease without esophagitis: Secondary | ICD-10-CM

## 2012-05-26 DIAGNOSIS — J45909 Unspecified asthma, uncomplicated: Secondary | ICD-10-CM

## 2012-05-26 DIAGNOSIS — R079 Chest pain, unspecified: Secondary | ICD-10-CM

## 2012-05-26 DIAGNOSIS — I10 Essential (primary) hypertension: Secondary | ICD-10-CM

## 2012-05-26 DIAGNOSIS — D573 Sickle-cell trait: Secondary | ICD-10-CM | POA: Diagnosis present

## 2012-05-26 DIAGNOSIS — F1910 Other psychoactive substance abuse, uncomplicated: Secondary | ICD-10-CM

## 2012-05-26 DIAGNOSIS — I25810 Atherosclerosis of coronary artery bypass graft(s) without angina pectoris: Secondary | ICD-10-CM

## 2012-05-26 DIAGNOSIS — G5600 Carpal tunnel syndrome, unspecified upper limb: Secondary | ICD-10-CM

## 2012-05-26 DIAGNOSIS — L678 Other hair color and hair shaft abnormalities: Secondary | ICD-10-CM

## 2012-05-26 DIAGNOSIS — A6000 Herpesviral infection of urogenital system, unspecified: Secondary | ICD-10-CM

## 2012-05-26 DIAGNOSIS — L738 Other specified follicular disorders: Secondary | ICD-10-CM

## 2012-05-26 DIAGNOSIS — F17200 Nicotine dependence, unspecified, uncomplicated: Secondary | ICD-10-CM | POA: Diagnosis present

## 2012-05-26 DIAGNOSIS — Z09 Encounter for follow-up examination after completed treatment for conditions other than malignant neoplasm: Secondary | ICD-10-CM

## 2012-05-26 DIAGNOSIS — K029 Dental caries, unspecified: Secondary | ICD-10-CM | POA: Diagnosis present

## 2012-05-26 DIAGNOSIS — F329 Major depressive disorder, single episode, unspecified: Secondary | ICD-10-CM

## 2012-05-26 DIAGNOSIS — E1149 Type 2 diabetes mellitus with other diabetic neurological complication: Secondary | ICD-10-CM | POA: Diagnosis present

## 2012-05-26 DIAGNOSIS — R739 Hyperglycemia, unspecified: Secondary | ICD-10-CM

## 2012-05-26 DIAGNOSIS — E119 Type 2 diabetes mellitus without complications: Secondary | ICD-10-CM

## 2012-05-26 DIAGNOSIS — Z9861 Coronary angioplasty status: Secondary | ICD-10-CM

## 2012-05-26 DIAGNOSIS — E7800 Pure hypercholesterolemia, unspecified: Secondary | ICD-10-CM

## 2012-05-26 DIAGNOSIS — R9439 Abnormal result of other cardiovascular function study: Secondary | ICD-10-CM

## 2012-05-26 DIAGNOSIS — E1142 Type 2 diabetes mellitus with diabetic polyneuropathy: Secondary | ICD-10-CM | POA: Diagnosis present

## 2012-05-26 DIAGNOSIS — F4310 Post-traumatic stress disorder, unspecified: Secondary | ICD-10-CM | POA: Diagnosis present

## 2012-05-26 DIAGNOSIS — L919 Hypertrophic disorder of the skin, unspecified: Secondary | ICD-10-CM

## 2012-05-26 DIAGNOSIS — N63 Unspecified lump in unspecified breast: Secondary | ICD-10-CM

## 2012-05-26 DIAGNOSIS — N951 Menopausal and female climacteric states: Secondary | ICD-10-CM

## 2012-05-26 DIAGNOSIS — Z8619 Personal history of other infectious and parasitic diseases: Secondary | ICD-10-CM

## 2012-05-26 DIAGNOSIS — Z79899 Other long term (current) drug therapy: Secondary | ICD-10-CM

## 2012-05-26 DIAGNOSIS — R0789 Other chest pain: Secondary | ICD-10-CM

## 2012-05-26 DIAGNOSIS — M722 Plantar fascial fibromatosis: Secondary | ICD-10-CM

## 2012-05-26 DIAGNOSIS — K611 Rectal abscess: Secondary | ICD-10-CM

## 2012-05-26 DIAGNOSIS — Z794 Long term (current) use of insulin: Secondary | ICD-10-CM

## 2012-05-26 DIAGNOSIS — A630 Anogenital (venereal) warts: Secondary | ICD-10-CM | POA: Diagnosis present

## 2012-05-26 DIAGNOSIS — I517 Cardiomegaly: Secondary | ICD-10-CM | POA: Diagnosis present

## 2012-05-26 DIAGNOSIS — Z8249 Family history of ischemic heart disease and other diseases of the circulatory system: Secondary | ICD-10-CM

## 2012-05-26 DIAGNOSIS — I2510 Atherosclerotic heart disease of native coronary artery without angina pectoris: Principal | ICD-10-CM | POA: Diagnosis present

## 2012-05-26 DIAGNOSIS — F3130 Bipolar disorder, current episode depressed, mild or moderate severity, unspecified: Secondary | ICD-10-CM | POA: Diagnosis present

## 2012-05-26 DIAGNOSIS — E669 Obesity, unspecified: Secondary | ICD-10-CM | POA: Diagnosis present

## 2012-05-26 DIAGNOSIS — B360 Pityriasis versicolor: Secondary | ICD-10-CM

## 2012-05-26 DIAGNOSIS — R300 Dysuria: Secondary | ICD-10-CM

## 2012-05-26 DIAGNOSIS — M79609 Pain in unspecified limb: Secondary | ICD-10-CM

## 2012-05-26 DIAGNOSIS — N3010 Interstitial cystitis (chronic) without hematuria: Secondary | ICD-10-CM

## 2012-05-26 DIAGNOSIS — F3132 Bipolar disorder, current episode depressed, moderate: Secondary | ICD-10-CM

## 2012-05-26 DIAGNOSIS — F3289 Other specified depressive episodes: Secondary | ICD-10-CM

## 2012-05-26 DIAGNOSIS — N61 Mastitis without abscess: Secondary | ICD-10-CM | POA: Diagnosis present

## 2012-05-26 DIAGNOSIS — I200 Unstable angina: Secondary | ICD-10-CM

## 2012-05-26 DIAGNOSIS — L909 Atrophic disorder of skin, unspecified: Secondary | ICD-10-CM

## 2012-05-26 DIAGNOSIS — J449 Chronic obstructive pulmonary disease, unspecified: Secondary | ICD-10-CM | POA: Diagnosis present

## 2012-05-26 DIAGNOSIS — R931 Abnormal findings on diagnostic imaging of heart and coronary circulation: Secondary | ICD-10-CM

## 2012-05-26 DIAGNOSIS — N611 Abscess of the breast and nipple: Secondary | ICD-10-CM

## 2012-05-26 DIAGNOSIS — J4489 Other specified chronic obstructive pulmonary disease: Secondary | ICD-10-CM | POA: Diagnosis present

## 2012-05-26 DIAGNOSIS — Z87891 Personal history of nicotine dependence: Secondary | ICD-10-CM

## 2012-05-26 LAB — CBC
HCT: 37.2 % (ref 36.0–46.0)
Hemoglobin: 13.4 g/dL (ref 12.0–15.0)
WBC: 13.3 10*3/uL — ABNORMAL HIGH (ref 4.0–10.5)

## 2012-05-26 LAB — GLUCOSE, CAPILLARY
Glucose-Capillary: 279 mg/dL — ABNORMAL HIGH (ref 70–99)
Glucose-Capillary: 292 mg/dL — ABNORMAL HIGH (ref 70–99)

## 2012-05-26 LAB — COMPREHENSIVE METABOLIC PANEL
BUN: 11 mg/dL (ref 6–23)
Calcium: 9.1 mg/dL (ref 8.4–10.5)
GFR calc Af Amer: >90 mL/min (ref >90)
Glucose, Bld: 302 mg/dL — ABNORMAL HIGH (ref 70–99)
Sodium: 134 mEq/L — ABNORMAL LOW (ref 135–145)
Total Protein: 7.2 g/dL (ref 6.0–8.3)

## 2012-05-26 LAB — CBC WITH DIFFERENTIAL/PLATELET
HCT: 38.3 % (ref 36.0–46.0)
Hemoglobin: 13.6 g/dL (ref 12.0–15.0)
Lymphocytes Relative: 33 % (ref 12–46)
Monocytes Absolute: 0.6 10*3/uL (ref 0.1–1.0)
Monocytes Relative: 5 % (ref 3–12)
Neutro Abs: 7.7 10*3/uL (ref 1.7–7.7)
RBC: 4.59 MIL/uL (ref 3.87–5.11)
WBC: 12.8 10*3/uL — ABNORMAL HIGH (ref 4.0–10.5)

## 2012-05-26 LAB — BASIC METABOLIC PANEL
Chloride: 98 mEq/L (ref 96–112)
GFR calc Af Amer: >90 mL/min (ref >90)
GFR calc non Af Amer: >90 mL/min (ref >90)
Potassium: 3.8 mEq/L (ref 3.5–5.1)
Sodium: 133 mEq/L — ABNORMAL LOW (ref 135–145)

## 2012-05-26 LAB — VALPROIC ACID LEVEL: Valproic Acid Lvl: <10 ug/mL — ABNORMAL LOW (ref 50.0–100.0)

## 2012-05-26 LAB — MRSA PCR SCREENING: MRSA by PCR: NEGATIVE

## 2012-05-26 LAB — TROPONIN I
Troponin I: <0.3 ng/mL (ref <0.30)
Troponin I: <0.3 ng/mL (ref <0.30)

## 2012-05-26 LAB — POCT I-STAT TROPONIN I: Troponin i, poc: 0 ng/mL (ref 0.00–0.08)

## 2012-05-26 LAB — D-DIMER, QUANTITATIVE: D-Dimer, Quant: 0.6 ug/mL-FEU — ABNORMAL HIGH (ref 0.00–0.48)

## 2012-05-26 MED ORDER — NITROGLYCERIN IN D5W 200-5 MCG/ML-% IV SOLN
5.0000 ug/min | INTRAVENOUS | Status: DC
  Administered 2012-05-26: 5 ug/min via INTRAVENOUS
  Filled 2012-05-26 (×2): qty 250

## 2012-05-26 MED ORDER — SODIUM CHLORIDE 0.9 % IV SOLN
Freq: Once | INTRAVENOUS | Status: AC
  Administered 2012-05-26: 02:00:00 via INTRAVENOUS

## 2012-05-26 MED ORDER — NITROGLYCERIN 0.4 MG SL SUBL: 0.4000 mg | SUBLINGUAL_TABLET | SUBLINGUAL | Status: DC | PRN

## 2012-05-26 MED ORDER — LIVING WELL WITH DIABETES BOOK
Freq: Once | Status: AC
  Administered 2012-05-26: 15:00:00
  Filled 2012-05-26: qty 1

## 2012-05-26 MED ORDER — GI COCKTAIL ~~LOC~~
30.0000 mL | Freq: Once | ORAL | Status: AC
  Administered 2012-05-26: 30 mL via ORAL
  Filled 2012-05-26: qty 30

## 2012-05-26 MED ORDER — DIVALPROEX SODIUM ER 500 MG PO TB24
500.0000 mg | ORAL_TABLET | Freq: Every day | ORAL | Status: DC
  Administered 2012-05-26 – 2012-05-29 (×4): 500 mg via ORAL
  Filled 2012-05-26 (×5): qty 1

## 2012-05-26 MED ORDER — REGADENOSON 0.4 MG/5ML IV SOLN
0.4000 mg | Freq: Once | INTRAVENOUS | Status: DC
  Filled 2012-05-26: qty 5

## 2012-05-26 MED ORDER — SODIUM CHLORIDE 0.9 % IV BOLUS (SEPSIS)
500.0000 mL | Freq: Once | INTRAVENOUS | Status: AC
  Administered 2012-05-26: 500 mL via INTRAVENOUS

## 2012-05-26 MED ORDER — SODIUM CHLORIDE 0.9 % IJ SOLN
3.0000 mL | Freq: Two times a day (BID) | INTRAMUSCULAR | Status: DC
  Administered 2012-05-26: 3 mL via INTRAVENOUS

## 2012-05-26 MED ORDER — ONDANSETRON HCL 4 MG/2ML IJ SOLN: 4.0000 mg | Freq: Four times a day (QID) | INTRAMUSCULAR | Status: DC | PRN

## 2012-05-26 MED ORDER — MORPHINE SULFATE 4 MG/ML IJ SOLN
4.0000 mg | Freq: Once | INTRAMUSCULAR | Status: AC
  Administered 2012-05-26: 4 mg via INTRAVENOUS
  Filled 2012-05-26: qty 1

## 2012-05-26 MED ORDER — DARIFENACIN HYDROBROMIDE ER 15 MG PO TB24
15.0000 mg | ORAL_TABLET | Freq: Every day | ORAL | Status: DC
  Administered 2012-05-26 – 2012-05-30 (×4): 15 mg via ORAL
  Filled 2012-05-26 (×5): qty 1

## 2012-05-26 MED ORDER — SODIUM CHLORIDE 0.9 % IV SOLN
INTRAVENOUS | Status: DC
  Administered 2012-05-26: 18:00:00 via INTRAVENOUS

## 2012-05-26 MED ORDER — KETOROLAC TROMETHAMINE 10 MG PO TABS
10.0000 mg | ORAL_TABLET | Freq: Four times a day (QID) | ORAL | Status: DC
  Administered 2012-05-26: 10 mg via ORAL
  Filled 2012-05-26 (×4): qty 1

## 2012-05-26 MED ORDER — LISINOPRIL 10 MG PO TABS
10.0000 mg | ORAL_TABLET | Freq: Every day | ORAL | Status: DC
  Administered 2012-05-26 – 2012-05-27 (×2): 10 mg via ORAL
  Filled 2012-05-26 (×2): qty 1

## 2012-05-26 MED ORDER — SODIUM CHLORIDE 0.9 % IJ SOLN
3.0000 mL | Freq: Two times a day (BID) | INTRAMUSCULAR | Status: DC
  Administered 2012-05-26 – 2012-05-29 (×5): 3 mL via INTRAVENOUS
  Administered 2012-05-30 (×2): via INTRAVENOUS

## 2012-05-26 MED ORDER — INSULIN DETEMIR 100 UNIT/ML ~~LOC~~ SOLN
10.0000 [IU] | Freq: Every day | SUBCUTANEOUS | Status: DC
  Administered 2012-05-26: 10 [IU] via SUBCUTANEOUS
  Filled 2012-05-26 (×2): qty 0.1

## 2012-05-26 MED ORDER — INSULIN ASPART 100 UNIT/ML ~~LOC~~ SOLN
0.0000 [IU] | Freq: Every day | SUBCUTANEOUS | Status: DC
  Administered 2012-05-26 – 2012-05-27 (×2): 3 [IU] via SUBCUTANEOUS
  Administered 2012-05-28: 2 [IU] via SUBCUTANEOUS
  Administered 2012-05-29: 3 [IU] via SUBCUTANEOUS

## 2012-05-26 MED ORDER — ASPIRIN EC 325 MG PO TBEC
325.0000 mg | DELAYED_RELEASE_TABLET | Freq: Every day | ORAL | Status: DC
  Administered 2012-05-26 – 2012-05-28 (×3): 325 mg via ORAL
  Filled 2012-05-26 (×4): qty 1

## 2012-05-26 MED ORDER — ACETAMINOPHEN 650 MG RE SUPP: 650.0000 mg | Freq: Four times a day (QID) | RECTAL | Status: DC | PRN

## 2012-05-26 MED ORDER — ENOXAPARIN SODIUM 40 MG/0.4ML ~~LOC~~ SOLN
40.0000 mg | SUBCUTANEOUS | Status: DC
  Administered 2012-05-26: 40 mg via SUBCUTANEOUS
  Filled 2012-05-26: qty 0.4

## 2012-05-26 MED ORDER — ENOXAPARIN SODIUM 40 MG/0.4ML ~~LOC~~ SOLN: 40.0000 mg | SUBCUTANEOUS | Status: DC

## 2012-05-26 MED ORDER — ENOXAPARIN SODIUM 120 MG/0.8ML ~~LOC~~ SOLN
1.0000 mg/kg | Freq: Two times a day (BID) | SUBCUTANEOUS | Status: DC
  Filled 2012-05-26 (×2): qty 0.8

## 2012-05-26 MED ORDER — OXYCODONE HCL 5 MG PO TABS
5.0000 mg | ORAL_TABLET | ORAL | Status: DC | PRN
  Administered 2012-05-27 (×2): 5 mg via ORAL
  Administered 2012-05-27 – 2012-05-29 (×4): 10 mg via ORAL
  Filled 2012-05-26 (×2): qty 2
  Filled 2012-05-26 (×2): qty 1
  Filled 2012-05-26 (×2): qty 2

## 2012-05-26 MED ORDER — MORPHINE SULFATE 2 MG/ML IJ SOLN
1.0000 mg | INTRAMUSCULAR | Status: DC | PRN
  Administered 2012-05-29: 2 mg via INTRAVENOUS
  Filled 2012-05-26: qty 1

## 2012-05-26 MED ORDER — GABAPENTIN 300 MG PO CAPS
300.0000 mg | ORAL_CAPSULE | Freq: Two times a day (BID) | ORAL | Status: DC
  Administered 2012-05-26 – 2012-05-30 (×8): 300 mg via ORAL
  Filled 2012-05-26 (×10): qty 1

## 2012-05-26 MED ORDER — SODIUM CHLORIDE 0.9 % IV SOLN: Freq: Once | INTRAVENOUS | Status: DC

## 2012-05-26 MED ORDER — OXYCODONE-ACETAMINOPHEN 5-325 MG PO TABS
1.0000 | ORAL_TABLET | ORAL | Status: DC | PRN
  Administered 2012-05-26 (×2): 1 via ORAL
  Filled 2012-05-26 (×2): qty 1

## 2012-05-26 MED ORDER — BUPROPION HCL ER (XL) 150 MG PO TB24
150.0000 mg | ORAL_TABLET | Freq: Every day | ORAL | Status: DC
  Administered 2012-05-26 – 2012-05-30 (×4): 150 mg via ORAL
  Filled 2012-05-26 (×5): qty 1

## 2012-05-26 MED ORDER — INSULIN ASPART 100 UNIT/ML ~~LOC~~ SOLN
0.0000 [IU] | Freq: Three times a day (TID) | SUBCUTANEOUS | Status: DC
  Administered 2012-05-26: 7 [IU] via SUBCUTANEOUS
  Administered 2012-05-27: 11 [IU] via SUBCUTANEOUS
  Administered 2012-05-27: 4 [IU] via SUBCUTANEOUS
  Administered 2012-05-27 – 2012-05-28 (×2): 11 [IU] via SUBCUTANEOUS
  Administered 2012-05-28: 4 [IU] via SUBCUTANEOUS
  Administered 2012-05-28: 7 [IU] via SUBCUTANEOUS
  Administered 2012-05-29: 18:00:00 via SUBCUTANEOUS
  Administered 2012-05-30: 4 [IU] via SUBCUTANEOUS

## 2012-05-26 MED ORDER — IOHEXOL 350 MG/ML SOLN
100.0000 mL | Freq: Once | INTRAVENOUS | Status: AC | PRN
  Administered 2012-05-26: 100 mL via INTRAVENOUS

## 2012-05-26 MED ORDER — REGADENOSON 0.4 MG/5ML IV SOLN
0.4000 mg | Freq: Once | INTRAVENOUS | Status: AC
  Administered 2012-05-27: 0.4 mg via INTRAVENOUS
  Filled 2012-05-26: qty 5

## 2012-05-26 MED ORDER — ENOXAPARIN SODIUM 120 MG/0.8ML ~~LOC~~ SOLN
110.0000 mg | SUBCUTANEOUS | Status: DC
  Administered 2012-05-26: 110 mg via SUBCUTANEOUS
  Filled 2012-05-26 (×2): qty 0.8

## 2012-05-26 MED ORDER — ALBUTEROL SULFATE HFA 108 (90 BASE) MCG/ACT IN AERS
2.0000 | INHALATION_SPRAY | Freq: Four times a day (QID) | RESPIRATORY_TRACT | Status: DC | PRN
  Filled 2012-05-26: qty 6.7

## 2012-05-26 MED ORDER — ACETAMINOPHEN 325 MG PO TABS
650.0000 mg | ORAL_TABLET | Freq: Four times a day (QID) | ORAL | Status: DC | PRN
  Administered 2012-05-27: 650 mg via ORAL
  Filled 2012-05-26: qty 2

## 2012-05-26 MED ORDER — ONDANSETRON HCL 4 MG PO TABS: 4.0000 mg | ORAL_TABLET | Freq: Four times a day (QID) | ORAL | Status: DC | PRN

## 2012-05-26 MED ORDER — NITROGLYCERIN 2 % TD OINT
0.5000 [in_us] | TOPICAL_OINTMENT | Freq: Once | TRANSDERMAL | Status: AC
  Administered 2012-05-26: 0.5 [in_us] via TOPICAL
  Filled 2012-05-26: qty 1

## 2012-05-26 MED ORDER — INSULIN ASPART 100 UNIT/ML ~~LOC~~ SOLN
0.0000 [IU] | Freq: Three times a day (TID) | SUBCUTANEOUS | Status: DC
  Administered 2012-05-26: 7 [IU] via SUBCUTANEOUS
  Administered 2012-05-26: 3 [IU] via SUBCUTANEOUS

## 2012-05-26 MED ORDER — PAROXETINE HCL ER 37.5 MG PO TB24
37.5000 mg | ORAL_TABLET | Freq: Every day | ORAL | Status: DC
  Administered 2012-05-26 – 2012-05-30 (×4): 37.5 mg via ORAL
  Filled 2012-05-26 (×5): qty 1

## 2012-05-26 NOTE — ED Notes (Signed)
Nitro patch removed per request of Dr. Norlene Campbell.

## 2012-05-26 NOTE — Telephone Encounter (Signed)
Copies from paper chart faxed to [phone] to brittany simmons  Copied sleep studies, last 2 office notes, ekg An echo report

## 2012-05-26 NOTE — H&P (Signed)
[facility] History and Physical  MARYCLARE NYDAM ZOX:[id number] DOB: 10/21/1965 DOA: 05/26/2012  Referring physician: Dr. Norlene Campbell. PCP: Quitman Livings, MD  Specialists: [facility].  Chief Complaint: Chest pain.  HPI: Cassidy [name] is a 47 y.o. female with history of diabetes mellitus type 2, hypertension ongoing tobacco abuse presented to the ER with chest pain. Chest pain was retrosternal pressure-like radiating to her neck. Started last night at 10:30 PM. Pain increased on deep inspiration and denies any associated shortness of breath fever chills or productive cough. In the ER patient's chest pain was relieved only after IV nitroglycerin infusion was started. EKG and cardiac markers and chest x-ray were unremarkable. Patient has been admitted for further management. Patient has had a recent right breast capsule drainage and the tube was removed yesterday.  Review of Systems: As presented in the history of presenting illness, rest negative.  Past Medical History  Diagnosis Date  . Hypertension   . GERD (gastroesophageal reflux disease)   . Breast abscess     recurrent; on right  . High cholesterol   . Type II diabetes mellitus   . Anemia   . Sickle cell trait   . Fibromyalgia   . Genital warts   . Constipation   . Diabetic peripheral neuropathy   . Depression   . Bipolar depression   . PTSD (post-traumatic stress disorder)   . Complication of anesthesia     deep pain in legs and arms for 3 days post anesthesia   . Asthma     seasonal  . Tooth caries     pt. states she will have an extraction in 5/14 on tooth on bottom right   Past Surgical History  Procedure Laterality Date  . Bladder surgery  1980    "TVT"  . Irrigation and debridement abscess  12/19/2010    Procedure: IRRIGATION AND DEBRIDEMENT ABSCESS;  Surgeon: Emelia Loron, MD;  Location: [facility] OR;  Service: General;  Laterality: Right;  . Incise and drain abcess  ; 04/26/11; 08/13/11   right breast  . Tonsillectomy  1988  . Refractive surgery  ~ 2010    right  . Irrigation and debridement abscess  08/13/2011    Procedure: MINOR INCISION AND DRAINAGE OF ABSCESS;  Surgeon: Cherylynn Ridges, MD;  Location: [facility] OR;  Service: General;  Laterality: Right;  Right Breast   . Irrigation and debridement abscess  11/17/2011    Procedure: IRRIGATION AND DEBRIDEMENT ABSCESS;  Surgeon: Wilmon Arms. Corliss Skains, MD;  Location: [facility] OR;  Service: General;  Laterality: Right;  irrigation and debridement right recurrent breast abscess  . Breast surgery Right     I&D for multiple abscesses  . Eye surgery Left     laser surgery  . Larynx surgery    . Ployp removed from voice box 03/30/12    . Incision and drainage abscess Right 05/09/2012    Procedure: INCISION AND DRAINAGE RIGHT BREAST ABSCESS;  Surgeon: Wilmon Arms. Corliss Skains, MD;  Location: [facility] OR;  Service: General;  Laterality: Right;   Social History:  reports that she has been smoking Cigarettes.  She has a 7.5 pack-year smoking history. She has never used smokeless tobacco. She reports that she does not drink alcohol or use illicit drugs. Lives at home. where does patient live-- Can do ADLs. Can patient participate in ADLs?  No Known Allergies  Family History  Problem Relation Age of Onset  . COPD Mother   . Cancer Mother  lymphoma  . Cancer Maternal Aunt     breast, colon      Prior to Admission medications   Medication Sig Start Date End Date Taking? Authorizing Provider  albuterol (PROVENTIL HFA;VENTOLIN HFA) 108 (90 BASE) MCG/ACT inhaler Inhale 2 puffs into the lungs every 6 (six) hours as needed for wheezing.   Yes Historical Provider, MD  buPROPion (WELLBUTRIN XL) 150 MG 24 hr tablet Take 150 mg by mouth daily.   Yes Historical Provider, MD  darifenacin (ENABLEX) 15 MG 24 hr tablet Take 15 mg by mouth daily.   Yes Historical Provider, MD  divalproex (DEPAKOTE ER) 500 MG 24 hr tablet Take 500 mg by mouth at bedtime.    Yes Historical  Provider, MD  gabapentin (NEURONTIN) 300 MG capsule Take 300 mg by mouth 2 (two) times daily.   Yes Historical Provider, MD  ibuprofen (ADVIL,MOTRIN) 800 MG tablet Take 800 mg by mouth every 8 (eight) hours as needed for pain.   Yes Historical Provider, MD  lisinopril (PRINIVIL,ZESTRIL) 10 MG tablet Take 10 mg by mouth daily.   Yes Historical Provider, MD  metFORMIN (GLUCOPHAGE) 500 MG tablet Take 500 mg by mouth 2 (two) times daily with a meal.     Yes Historical Provider, MD  oxyCODONE-acetaminophen (PERCOCET/ROXICET) 5-325 MG per tablet Take 1 tablet by mouth every 4 (four) hours as needed for pain. 05/23/12  Yes Wilmon Arms. Tsuei, MD  PARoxetine (PAXIL-CR) 37.5 MG 24 hr tablet Take 37.5 mg by mouth every morning.     Yes Historical Provider, MD   Physical Exam: Filed Vitals:   05/26/12 0145 05/26/12 0215 05/26/12 0230 05/26/12 0245  BP: 159/86 137/80 138/81 122/68  Pulse: 93 84 83 83  Temp:      TempSrc:      Resp: 30 20 22 22   SpO2: 100% 99% 98% 98%     General:  Well-developed well-nourished.  Eyes: Anicteric no pallor.  ENT: No discharge from the ears eyes nose mouth.  Neck: No mass felt.  Cardiovascular: S1-S2 heard.  Respiratory: No rhonchi or crepitations.  Abdomen: Soft nontender bowel sounds present.  Skin: No rash.  Musculoskeletal: No edema.  Psychiatric: Appears normal.  Neurologic: Alert awake oriented to time place and person. Moves all extremities.  Labs on Admission:  Basic Metabolic Panel:  Recent Labs Lab 05/26/12 0039  NA 133*  K 3.8  CL 98  CO2 25  GLUCOSE 357*  BUN 12  CREATININE 0.57  CALCIUM 9.2   Liver Function Tests: No results found for this basename: AST, ALT, ALKPHOS, BILITOT, PROT, ALBUMIN,  in the last 168 hours No results found for this basename: LIPASE, AMYLASE,  in the last 168 hours No results found for this basename: AMMONIA,  in the last 168 hours CBC:  Recent Labs Lab 05/26/12 0039  WBC 13.3*  HGB 13.4  HCT 37.2   MCV 82.1  PLT 296   Cardiac Enzymes:  Recent Labs Lab 05/26/12 0039  TROPONINI <0.30    BNP (last 3 results) No results found for this basename: PROBNP,  in the last 8760 hours CBG: No results found for this basename: GLUCAP,  in the last 168 hours  Radiological Exams on Admission: Dg Chest Port 1 View  05/26/2012   *RADIOLOGY REPORT*  Clinical Data: Chest pain, shortness of breath.  PORTABLE CHEST - 1 VIEW  Comparison: 09/16/2011  Findings: Mild peribronchial thickening.  No confluent opacities. Heart is normal size.  No effusions or acute bony abnormality.  IMPRESSION: Mild bronchitic changes.   Original Report Authenticated By: Charlett Nose, M.D.    EKG: Independently reviewed. Normal sinus rhythm.  Assessment/Plan Principal Problem:   Chest pain Active Problems:   Diabetes mellitus   Hypertension   Tobacco abuse   1. Chest pain -  At this time cycle cardiac markers. Continue nitroglycerin infusion. Aspirin. Patient states she did have a cardiac stress test done last year by [facility] and as per patient was negative. We'll consult them for further management. Presently chest pain-free. Check d-dimer and drug screen. 2. Uncontrolled diabetes mellitus type 2 - patient at this time is being placed on sliding-scale coverage. Check hemoglobin A1c. 3. Recently had right breast Abscess drained. 4. Hypertension - continue present medications. 5. Tobacco abuse - strongly advised patient to quit smoking.    Code Status:  full code.  Family Communication:  none.  Disposition Plan:  admit for observation.    [place] Deupree N. [facility] Pager [phone].  If 7PM-7AM, please contact night-coverage [email or web address] Password [email or web address] 05/26/2012, 3:06 AM

## 2012-05-26 NOTE — ED Notes (Signed)
Pt from home via [facility]. Pt c/o central chest pain with pain and pressure to neck onset 2300. Pt given 324ASA and 1SL nitro, neck pain relieved. Pt with hx of GERD and diabetes. 20g LAC.

## 2012-05-26 NOTE — Progress Notes (Signed)
[facility] Progress Note [place] TEAM 1 - Stepdown/ICU TEAM   CORINE SOLORIO ZOX:[id number] DOB: [birth year] DOA: 05/26/2012 PCP: Quitman Livings, MD  Brief narrative: 47 y.o. female with history of DM 2, hypertension, ongoing tobacco abuse who presented to the ER with chest pain. Chest pain was retrosternal pressure-like radiating to her neck. Pain increased on deep inspiration and without any associated shortness of breath fever chills or productive cough. In the ER patient's chest pain was relieved after IV nitroglycerin infusion was started. EKG and cardiac markers and chest x-ray were unremarkable.   Assessment/Plan:  Chest pain D-Dimer + - CT angio chest ordered - increase lovenox to full dose until PE ruled out - cardiac eval as per Cardiology  Uncontrolled diabetes mellitus type 2 A1c 10.2  Recently drained right breast Abscess   Hypertension  continue present medications.   Tobacco abuse strongly advised patient to quit smoking  Hyperlipidemia  Bipolar D/O  Dental carries   Obesity  Code Status: FULL Family Communication: no family present  Disposition Plan: SDU  Consultants: Cardiology - [facility]  Procedures: 5/16 - CT angio chest - pending 5/16 - nuc med stress - pending    Antibiotics: none  DVT prophylaxis: lovenox  HPI/Subjective: Pt seen for f/u visit   Objective: Blood pressure 124/77, pulse 98, temperature 97.8 F (36.6 C), temperature source Oral, resp. rate 26, height 5' 7.6" (1.717 m), weight 109 kg (240 lb 4.8 oz), last menstrual period 05/07/2012, SpO2 94.00%.  Intake/Output Summary (Last 24 hours) at 05/26/12 1508 Last data filed at 05/26/12 1400  Gross per 24 hour  Intake 965.45 ml  Output    350 ml  Net 615.45 ml    Exam: F/U exam completed  Data Reviewed: Basic Metabolic Panel:  Recent Labs Lab 05/26/12 0039 05/26/12 0505  NA 133* 134*  K 3.8 4.2  CL 98 99  CO2 25 27  GLUCOSE 357* 302*  BUN 12 11   CREATININE 0.57 0.57  CALCIUM 9.2 9.1   Liver Function Tests:  Recent Labs Lab 05/26/12 0505  AST 10  ALT 12  ALKPHOS 104  BILITOT 0.3  PROT 7.2  ALBUMIN 3.8   CBC:  Recent Labs Lab 05/26/12 0039 05/26/12 0505  WBC 13.3* 12.8*  NEUTROABS  --  7.7  HGB 13.4 13.6  HCT 37.2 38.3  MCV 82.1 83.4  PLT 296 308   Cardiac Enzymes:  Recent Labs Lab 05/26/12 0039 05/26/12 0505 05/26/12 0950  TROPONINI <0.30 <0.30 <0.30   CBG:  Recent Labs Lab 05/26/12 0744 05/26/12 1205  GLUCAP 244* 347*    Recent Results (from the past 240 hour(s))  MRSA PCR SCREENING     Status: None   Collection Time    05/26/12  4:27 AM      Result Value Range Status   MRSA by PCR NEGATIVE  NEGATIVE Final   Comment:            The GeneXpert MRSA Assay (FDA     approved for NASAL specimens     only), is one component of a     comprehensive MRSA colonization     surveillance program. It is not     intended to diagnose MRSA     infection nor to guide or     monitor treatment for     MRSA infections.     Studies:  Recent x-ray studies have been reviewed in detail by the Attending Physician  Scheduled Meds:  Scheduled Meds: .  sodium chloride   Intravenous Once  . aspirin EC  325 mg Oral Daily  . buPROPion  150 mg Oral Daily  . darifenacin  15 mg Oral Daily  . divalproex  500 mg Oral QHS  . enoxaparin (LOVENOX) injection  40 mg Subcutaneous Q24H  . gabapentin  300 mg Oral BID  . insulin aspart  0-9 Units Subcutaneous TID WC  . ketorolac  10 mg Oral Q6H  . lisinopril  10 mg Oral Daily  . living well with diabetes book   Does not apply Once  . PARoxetine  37.5 mg Oral Daily  . regadenoson  0.4 mg Intravenous Once  . sodium chloride  3 mL Intravenous Q12H  . sodium chloride  3 mL Intravenous Q12H   Continuous Infusions: . nitroGLYCERIN 10 mcg/min (05/26/12 0802)    Time spent on care of this patient: 25+ mins   [facility] T  [facility] Office   [phone] Pager - Text Page per Loretha Stapler as per below:  On-Call/Text Page:      [email or web address]      password [email or web address]  If 7PM-7AM, please contact night-coverage [email or web address] Password [email or web address] 05/26/2012, 3:08 PM   LOS: 0 days

## 2012-05-26 NOTE — ED Provider Notes (Signed)
History     CSN: [id number]  Arrival date & time 05/26/12  0013   First MD Initiated Contact with Patient 05/26/12 0016      Chief Complaint  Patient presents with  . Chest Pain    (Consider location/radiation/quality/duration/timing/severity/associated sxs/prior treatment) HPI 47 year old female presents to the emergency department via EMS with complaint of chest pain.  Patient reports she has a history of GERD, and had pizza tonight at 8 PM.  She reports she normally gets heartburn after eating such foods, but usually occurs within 30 minutes to an hour after eating.  About 3 hours after eating, she began to have substernal chest pain.  Pain radiates up into her neck bilaterally.  Pain is pressure sensation, and is severe.  It improved slightly with nitroglycerin given to her by EMS.  EMS also gave her a full strength aspirin.  She denies similar chest pain in the past.  She has nausea, diaphoresis, and shortness of breath with the pain.  Patient has history of hypertension, diabetes.  She is a pack-a-day smoker.  She reports she has a family history of coronary artery disease.  No prior workup for chest pain. Past Medical History  Diagnosis Date  . Hypertension   . GERD (gastroesophageal reflux disease)   . Breast abscess     recurrent; on right  . High cholesterol   . Type II diabetes mellitus   . Anemia   . Sickle cell trait   . Fibromyalgia   . Genital warts   . Constipation   . Diabetic peripheral neuropathy   . Depression   . Bipolar depression   . PTSD (post-traumatic stress disorder)   . Complication of anesthesia     deep pain in legs and arms for 3 days post anesthesia   . Asthma     seasonal  . Tooth caries     pt. states she will have an extraction in 5/14 on tooth on bottom right    Past Surgical History  Procedure Laterality Date  . Bladder surgery  1980    "TVT"  . Irrigation and debridement abscess  12/19/2010    Procedure: IRRIGATION AND DEBRIDEMENT  ABSCESS;  Surgeon: Emelia Loron, MD;  Location: [facility] OR;  Service: General;  Laterality: Right;  . Incise and drain abcess  ; 04/26/11; 08/13/11    right breast  . Tonsillectomy  1988  . Refractive surgery  ~ 2010    right  . Irrigation and debridement abscess  08/13/2011    Procedure: MINOR INCISION AND DRAINAGE OF ABSCESS;  Surgeon: Cherylynn Ridges, MD;  Location: [facility] OR;  Service: General;  Laterality: Right;  Right Breast   . Irrigation and debridement abscess  11/17/2011    Procedure: IRRIGATION AND DEBRIDEMENT ABSCESS;  Surgeon: Wilmon Arms. Corliss Skains, MD;  Location: [facility] OR;  Service: General;  Laterality: Right;  irrigation and debridement right recurrent breast abscess  . Breast surgery Right     I&D for multiple abscesses  . Eye surgery Left     laser surgery  . Larynx surgery    . Ployp removed from voice box 03/30/12    . Incision and drainage abscess Right 05/09/2012    Procedure: INCISION AND DRAINAGE RIGHT BREAST ABSCESS;  Surgeon: Wilmon Arms. Corliss Skains, MD;  Location: [facility] OR;  Service: General;  Laterality: Right;    Family History  Problem Relation Age of Onset  . COPD Mother   . Cancer Mother     lymphoma  .  Cancer Maternal Aunt     breast, colon    History  Substance Use Topics  . Smoking status: Current Every Day Smoker -- 0.25 packs/day for 30 years    Types: Cigarettes  . Smokeless tobacco: Never Used     Comment: 08/14/11 "quit for 3 wk 05/2011; was smoking 1 ppd since age 43 before I quit; at least I've cut down to 1/4 ppd"  . 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   other than listed in history of present illness  Allergies  Review of patient's allergies indicates no known allergies.  Home Medications   Current Outpatient Rx  Name  Route  Sig   Dispense  Refill  . albuterol (PROVENTIL HFA;VENTOLIN HFA) 108 (90 BASE) MCG/ACT inhaler   Inhalation   Inhale 2 puffs into the lungs every 6 (six) hours as needed for wheezing.         Marland Kitchen buPROPion (WELLBUTRIN XL) 150 MG 24 hr tablet   Oral   Take 150 mg by mouth daily.         Marland Kitchen darifenacin (ENABLEX) 15 MG 24 hr tablet   Oral   Take 15 mg by mouth daily.         . divalproex (DEPAKOTE ER) 500 MG 24 hr tablet   Oral   Take 500 mg by mouth at bedtime.          . gabapentin (NEURONTIN) 300 MG capsule   Oral   Take 300 mg by mouth 2 (two) times daily.         Marland Kitchen ibuprofen (ADVIL,MOTRIN) 800 MG tablet   Oral   Take 800 mg by mouth every 8 (eight) hours as needed for pain.         Marland Kitchen lisinopril (PRINIVIL,ZESTRIL) 10 MG tablet   Oral   Take 10 mg by mouth daily.         . metFORMIN (GLUCOPHAGE) 500 MG tablet   Oral   Take 500 mg by mouth 2 (two) times daily with a meal.           . oxyCODONE-acetaminophen (PERCOCET/ROXICET) 5-325 MG per tablet   Oral   Take 1 tablet by mouth every 4 (four) hours as needed for pain.   40 tablet   0   . PARoxetine (PAXIL-CR) 37.5 MG 24 hr tablet   Oral   Take 37.5 mg by mouth every morning.             BP 141/70  Pulse 92  Temp(Src) 98.7 F (37.1 C) (Oral)  Resp 29  SpO2 100%  LMP 05/07/2012  Physical Exam  Nursing note and vitals reviewed. Constitutional: She is oriented to person, place, and time. She appears well-developed and well-nourished. She appears distressed (uncomfortable ).  HENT:  Head: Normocephalic and atraumatic.  Nose: Nose normal.  Mouth/Throat: Oropharynx is clear and moist.  Eyes: Conjunctivae and EOM are normal. Pupils are equal, round, and reactive to light.  Neck: Normal range of motion. Neck supple. No JVD present. No tracheal deviation present. No thyromegaly present.  Cardiovascular: Normal rate, regular rhythm, normal heart sounds and intact distal pulses.  Exam reveals no gallop and  no  friction rub.   No murmur heard. Pulmonary/Chest: Effort normal and breath sounds normal. No stridor. No respiratory distress. She has no wheezes. She has no rales. She exhibits tenderness (palpation of anterior chest gives pain, but not completely reproduces her ongoing chest pain).  Abdominal: Soft. Bowel sounds are normal. She exhibits no distension and no mass. There is no tenderness. There is no rebound and no guarding.  Musculoskeletal: Normal range of motion. She exhibits no edema and no tenderness.  Lymphadenopathy:    She has no cervical adenopathy.  Neurological: She is alert and oriented to person, place, and time. She exhibits normal muscle tone. Coordination normal.  Skin: Skin is warm and dry. No rash noted. No erythema. No pallor.  Psychiatric: She has a normal mood and affect. Her behavior is normal. Judgment and thought content normal.    ED Course  Procedures (including critical care time)  Labs Reviewed  BASIC METABOLIC PANEL - Abnormal; Notable for the following:    Sodium 133 (*)    Glucose, Bld 357 (*)    All other components within normal limits  CBC - Abnormal; Notable for the following:    WBC 13.3 (*)    All other components within normal limits  TROPONIN I  POCT I-STAT TROPONIN I   Dg Chest Port 1 View  05/26/2012   *RADIOLOGY REPORT*  Clinical Data: Chest pain, shortness of breath.  PORTABLE CHEST - 1 VIEW  Comparison: 09/16/2011  Findings: Mild peribronchial thickening.  No confluent opacities. Heart is normal size.  No effusions or acute bony abnormality.  IMPRESSION: Mild bronchitic changes.   Original Report Authenticated By: Charlett Nose, M.D.    Date: 05/26/2012  Rate: 91  Rhythm: normal sinus rhythm  QRS Axis: normal  Intervals: normal  ST/T Wave abnormalities: normal  Conduction Disutrbances:none  Narrative Interpretation:   Old EKG Reviewed: unchanged    1. Chest pain   2. Diabetes   3. Hyperglycemia without ketosis   4. Tobacco abuse    5. Hypertension       MDM  PA-year-old female with multiple risk factors for coronary disease, with ongoing chest pain.  Differential includes gastroesophageal reflux disease versus ACS.  Patient has had no improvement with GI cocktail.  No improvement with nitroglycerin paste.  She is currently on IV nitroglycerin.  She is also given morphine to help with pain.  Workup thus far has not shown a specific cause for her pain.  Her initial troponin is negative, and her EKG does not show ST elevation.  She is noted to be hyperglycemic.  Will discuss with hospitalist for admission for chest pain.        Olivia Mackie, MD 05/26/12 [phone]

## 2012-05-26 NOTE — Consult Note (Addendum)
Reason for Consult: Chest Pain Referring Physician: TRH  HPI: The patient is a 47 y/o AAF, followed by [facility] in the past. She hasn't been seen in follow-up since 2011. She has a history of HTN, T2DM, HLD, GERD and a 32 year history of tobacco abuse. She reports smoking 1/2 ppd. She also has a family history of CAD. Her mother was diagnosed with a MI at the age of 47. The patient herself has never had a heart attack, but states that she underwent a stress test, nearly a year ago, due to chest pain, that was normal. She presented to the [facility] ED last night with a complaint of chest pain. she reports was similar to her pain in the past. She had eaten pizza for dinner around 8:00pm, then developed resting substernal chest pressure and tightness, with radiation to the bilateral neck and jaws. No radiation to the arms. The pain reportedly increased in intensity over the span of 30 minutes. The pain was 10/10. No alleviating factors. It was also mildly pleuritic. It was made worse sitting forward. She denies associated SOB, diaphoresis, syncope/presyncope. She had nausea but no vomiting. She also notes that it is somewhat reproducible with palpation. She denies recent trauma, injury or heavy lifting. She took her PPI and an antacid with no relief. She then decided to report to the ED for evaluation.  Work-up revealed a normal EKG with no acute changes. Cardiac enzymes negative x 2. CXR was normal. In the ED, she was given both NTG and a GI cocktail and her pain resolved. She was admitted by [facility] for further observation and work-up. She is currently more comfortable than she was last night, but continues to have mild SSCP that is 6/10. No other symptoms currently.    Past Medical History  Diagnosis Date  . Hypertension   . GERD (gastroesophageal reflux disease)   . Breast abscess     recurrent; on right  . High cholesterol   . Type II diabetes mellitus   . Anemia   . Sickle cell trait   . Fibromyalgia   . Genital  warts   . Constipation   . Diabetic peripheral neuropathy   . Depression   . Bipolar depression   . PTSD (post-traumatic stress disorder)   . Complication of anesthesia     deep pain in legs and arms for 3 days post anesthesia   . Asthma     seasonal  . Tooth caries     pt. states she will have an extraction in 5/14 on tooth on bottom right    Past Surgical History  Procedure Laterality Date  . Bladder surgery  1980    "TVT"  . Irrigation and debridement abscess  12/19/2010    Procedure: IRRIGATION AND DEBRIDEMENT ABSCESS;  Surgeon: Emelia Loron, MD;  Location: [facility] OR;  Service: General;  Laterality: Right;  . Incise and drain abcess  ; 04/26/11; 08/13/11    right breast  . Tonsillectomy  1988  . Refractive surgery  ~ 2010    right  . Irrigation and debridement abscess  08/13/2011    Procedure: MINOR INCISION AND DRAINAGE OF ABSCESS;  Surgeon: Cherylynn Ridges, MD;  Location: [facility] OR;  Service: General;  Laterality: Right;  Right Breast   . Irrigation and debridement abscess  11/17/2011    Procedure: IRRIGATION AND DEBRIDEMENT ABSCESS;  Surgeon: Wilmon Arms. Corliss Skains, MD;  Location: [facility] OR;  Service: General;  Laterality: Right;  irrigation and debridement right recurrent breast  abscess  . Breast surgery Right     I&D for multiple abscesses  . Eye surgery Left     laser surgery  . Larynx surgery    . Ployp removed from voice box 03/30/12    . Incision and drainage abscess Right 05/09/2012    Procedure: INCISION AND DRAINAGE RIGHT BREAST ABSCESS;  Surgeon: Wilmon Arms. Corliss Skains, MD;  Location: [facility] OR;  Service: General;  Laterality: Right;    Family History  Problem Relation Age of Onset  . COPD Mother   . Cancer Mother     lymphoma  . Cancer Maternal Aunt     breast, colon    Social History:  reports that she has been smoking Cigarettes.  She has a 7.5 pack-year smoking history. She has never used smokeless tobacco. She reports that she does not drink alcohol or use illicit  drugs.  Allergies: No Known Allergies  Medications:  Prior to Admission:  Prescriptions prior to admission  Medication Sig Dispense Refill  . albuterol (PROVENTIL HFA;VENTOLIN HFA) 108 (90 BASE) MCG/ACT inhaler Inhale 2 puffs into the lungs every 6 (six) hours as needed for wheezing.      Marland Kitchen buPROPion (WELLBUTRIN XL) 150 MG 24 hr tablet Take 150 mg by mouth daily.      Marland Kitchen darifenacin (ENABLEX) 15 MG 24 hr tablet Take 15 mg by mouth daily.      . divalproex (DEPAKOTE ER) 500 MG 24 hr tablet Take 500 mg by mouth at bedtime.       . gabapentin (NEURONTIN) 300 MG capsule Take 300 mg by mouth 2 (two) times daily.      Marland Kitchen ibuprofen (ADVIL,MOTRIN) 800 MG tablet Take 800 mg by mouth every 8 (eight) hours as needed for pain.      Marland Kitchen lisinopril (PRINIVIL,ZESTRIL) 10 MG tablet Take 10 mg by mouth daily.      . metFORMIN (GLUCOPHAGE) 500 MG tablet Take 500 mg by mouth 2 (two) times daily with a meal.        . oxyCODONE-acetaminophen (PERCOCET/ROXICET) 5-325 MG per tablet Take 1 tablet by mouth every 4 (four) hours as needed for pain.  40 tablet  0  . PARoxetine (PAXIL-CR) 37.5 MG 24 hr tablet Take 37.5 mg by mouth every morning.          Results for orders placed during the hospital encounter of 05/26/12 (from the past 48 hour(s))  BASIC METABOLIC PANEL     Status: Abnormal   Collection Time    05/26/12 12:39 AM      Result Value Range   Sodium 133 (*) 135 - 145 mEq/L   Potassium 3.8  3.5 - 5.1 mEq/L   Chloride 98  96 - 112 mEq/L   CO2 25  19 - 32 mEq/L   Glucose, Bld 357 (*) 70 - 99 mg/dL   BUN 12  6 - 23 mg/dL   Creatinine, Ser 1.61  0.50 - 1.10 mg/dL   Calcium 9.2  8.4 - 09.6 mg/dL   GFR calc non Af Amer >90  >90 mL/min   GFR calc Af Amer >90  >90 mL/min   Comment:            The eGFR has been calculated     using the CKD EPI equation.     This calculation has not been     validated in all clinical     situations.     eGFR's persistently     <90 mL/min signify  possible Chronic  Kidney Disease.  CBC     Status: Abnormal   Collection Time    05/26/12 12:39 AM      Result Value Range   WBC 13.3 (*) 4.0 - 10.5 K/uL   RBC 4.53  3.87 - 5.11 MIL/uL   Hemoglobin 13.4  12.0 - 15.0 g/dL   HCT 04.5  40.9 - 81.1 %   MCV 82.1  78.0 - 100.0 fL   MCH 29.6  26.0 - 34.0 pg   MCHC 36.0  30.0 - 36.0 g/dL   RDW 91.4  78.2 - 95.6 %   Platelets 296  150 - 400 K/uL  TROPONIN I     Status: None   Collection Time    05/26/12 12:39 AM      Result Value Range   Troponin I <0.30  <0.30 ng/mL   Comment:            Due to the release kinetics of cTnI,     a negative result within the first hours     of the onset of symptoms does not rule out     myocardial infarction with certainty.     If myocardial infarction is still suspected,     repeat the test at appropriate intervals.  POCT I-STAT TROPONIN I     Status: None   Collection Time    05/26/12  1:03 AM      Result Value Range   Troponin i, poc 0.00  0.00 - 0.08 ng/mL   Comment 3            Comment: Due to the release kinetics of cTnI,     a negative result within the first hours     of the onset of symptoms does not rule out     myocardial infarction with certainty.     If myocardial infarction is still suspected,     repeat the test at appropriate intervals.  POCT PREGNANCY, URINE     Status: None   Collection Time    05/26/12  3:46 AM      Result Value Range   Preg Test, Ur NEGATIVE  NEGATIVE   Comment:            THE SENSITIVITY OF THIS     METHODOLOGY IS >24 mIU/mL  MRSA PCR SCREENING     Status: None   Collection Time    05/26/12  4:27 AM      Result Value Range   MRSA by PCR NEGATIVE  NEGATIVE   Comment:            The GeneXpert MRSA Assay (FDA     approved for NASAL specimens     only), is one component of a     comprehensive MRSA colonization     surveillance program. It is not     intended to diagnose MRSA     infection nor to guide or     monitor treatment for     MRSA infections.  TROPONIN I      Status: None   Collection Time    05/26/12  5:05 AM      Result Value Range   Troponin I <0.30  <0.30 ng/mL   Comment:            Due to the release kinetics of cTnI,     a negative result within the first hours     of the onset of symptoms does not rule  out     myocardial infarction with certainty.     If myocardial infarction is still suspected,     repeat the test at appropriate intervals.  COMPREHENSIVE METABOLIC PANEL     Status: Abnormal   Collection Time    05/26/12  5:05 AM      Result Value Range   Sodium 134 (*) 135 - 145 mEq/L   Potassium 4.2  3.5 - 5.1 mEq/L   Chloride 99  96 - 112 mEq/L   CO2 27  19 - 32 mEq/L   Glucose, Bld 302 (*) 70 - 99 mg/dL   BUN 11  6 - 23 mg/dL   Creatinine, Ser 4.09  0.50 - 1.10 mg/dL   Calcium 9.1  8.4 - 81.1 mg/dL   Total Protein 7.2  6.0 - 8.3 g/dL   Albumin 3.8  3.5 - 5.2 g/dL   AST 10  0 - 37 U/L   ALT 12  0 - 35 U/L   Alkaline Phosphatase 104  39 - 117 U/L   Total Bilirubin 0.3  0.3 - 1.2 mg/dL   GFR calc non Af Amer >90  >90 mL/min   GFR calc Af Amer >90  >90 mL/min   Comment:            The eGFR has been calculated     using the CKD EPI equation.     This calculation has not been     validated in all clinical     situations.     eGFR's persistently     <90 mL/min signify     possible Chronic Kidney Disease.  CBC WITH DIFFERENTIAL     Status: Abnormal   Collection Time    05/26/12  5:05 AM      Result Value Range   WBC 12.8 (*) 4.0 - 10.5 K/uL   RBC 4.59  3.87 - 5.11 MIL/uL   Hemoglobin 13.6  12.0 - 15.0 g/dL   HCT 91.4  78.2 - 95.6 %   MCV 83.4  78.0 - 100.0 fL   MCH 29.6  26.0 - 34.0 pg   MCHC 35.5  30.0 - 36.0 g/dL   RDW 21.3  08.6 - 57.8 %   Platelets 308  150 - 400 K/uL   Neutrophils Relative % 60  43 - 77 %   Neutro Abs 7.7  1.7 - 7.7 K/uL   Lymphocytes Relative 33  12 - 46 %   Lymphs Abs 4.2 (*) 0.7 - 4.0 K/uL   Monocytes Relative 5  3 - 12 %   Monocytes Absolute 0.6  0.1 - 1.0 K/uL   Eosinophils Relative  2  0 - 5 %   Eosinophils Absolute 0.2  0.0 - 0.7 K/uL   Basophils Relative 0  0 - 1 %   Basophils Absolute 0.0  0.0 - 0.1 K/uL  D-DIMER, QUANTITATIVE     Status: Abnormal   Collection Time    05/26/12  5:05 AM      Result Value Range   D-Dimer, Quant 0.60 (*) 0.00 - 0.48 ug/mL-FEU   Comment:            AT THE INHOUSE ESTABLISHED CUTOFF     VALUE OF 0.48 ug/mL FEU,     THIS ASSAY HAS BEEN DOCUMENTED     IN THE LITERATURE TO HAVE     A SENSITIVITY AND NEGATIVE     PREDICTIVE VALUE OF AT LEAST     98 TO  99%.  THE TEST RESULT     SHOULD BE CORRELATED WITH     AN ASSESSMENT OF THE CLINICAL     PROBABILITY OF DVT / VTE.  VALPROIC ACID LEVEL     Status: Abnormal   Collection Time    05/26/12  5:05 AM      Result Value Range   Valproic Acid Lvl <10.0 (*) 50.0 - 100.0 ug/mL  GLUCOSE, CAPILLARY     Status: Abnormal   Collection Time    05/26/12  7:44 AM      Result Value Range   Glucose-Capillary 244 (*) 70 - 99 mg/dL    Dg Chest Port 1 View  05/26/2012   *RADIOLOGY REPORT*  Clinical Data: Chest pain, shortness of breath.  PORTABLE CHEST - 1 VIEW  Comparison: 09/16/2011  Findings: Mild peribronchial thickening.  No confluent opacities. Heart is normal size.  No effusions or acute bony abnormality.  IMPRESSION: Mild bronchitic changes.   Original Report Authenticated By: Charlett Nose, M.D.    Review of Systems  Constitutional: Negative for fever, chills and diaphoresis.  HENT: Positive for neck pain.   Respiratory: Negative for shortness of breath.   Cardiovascular: Positive for chest pain. Negative for orthopnea, claudication, leg swelling and PND.  Gastrointestinal: Positive for nausea and constipation. Negative for vomiting, abdominal pain, diarrhea, blood in stool and melena.  Genitourinary: Negative for hematuria.  Musculoskeletal: Negative for falls.  Neurological: Negative for dizziness and loss of consciousness.   Blood pressure 121/78, pulse 74, temperature 98.4 F (36.9  C), temperature source Oral, resp. rate 16, height 5' 7.6" (1.717 m), weight 240 lb 4.8 oz (109 kg), last menstrual period 05/07/2012, SpO2 95.00%. Physical Exam  Constitutional: She is oriented to person, place, and time. She appears well-developed and well-nourished. No distress.  HENT:  Head: Normocephalic and atraumatic.  Eyes: Conjunctivae and EOM are normal. Pupils are equal, round, and reactive to light.  Neck: No JVD present. Carotid bruit is not present. No thyromegaly present.  Cardiovascular: Normal rate, regular rhythm, normal heart sounds and intact distal pulses.  Exam reveals no gallop and no friction rub.   No murmur heard. Pulses:      Radial pulses are 2+ on the right side, and 2+ on the left side.       Dorsalis pedis pulses are 2+ on the right side, and 2+ on the left side.  Respiratory: Effort normal and breath sounds normal. No respiratory distress. She has no wheezes. She has no rales. She exhibits tenderness (midsternal- reproduces chest pain).  GI: Soft. Bowel sounds are normal. She exhibits no distension and no mass. There is no tenderness. There is no rebound.  Musculoskeletal: She exhibits no edema.  Lymphadenopathy:    She has no cervical adenopathy.  Neurological: She is alert and oriented to person, place, and time.  Skin: Skin is warm and dry. She is not diaphoretic.  Psychiatric: She has a normal mood and affect. Her behavior is normal.    Assessment/Plan: Principal Problem:   Chest pain with moderate risk for cardiac etiology Active Problems:   Hypertension   Tobacco abuse   HLD (hyperlipidemia)   Family history of early CAD  Plan: Pt with positive cardiac risk factors, HTN, DM, HLD, moderate obesity, positive family hx and long standing hx of tobacco abuse, endorsing both typical and atypical chest pain symptoms. Substernal resting chest pressure/tightness with radiation to the neck and jaw with relief with NTG is concerning for cardiac etiology.  However, mildly pleuritic pain that is also somewhat reproducible with palpation seems to be musculoskeletal. Work-up so far has revealed negative cardiac enzymes x 2 and normal EKG and CXR. Considering her risk factors, it is best to proceed with non-invasive ischemic testing to rule out CAD. I have contacted nuclear medicine and a stress test cannot be arranged for today. Can arrange for tomorrow or have pt complete as an OP. Keep on IV NTG for now until seen by MD. Will also give a trial of Toradol to see if pain improves. MD to follow with further recommendation.   Allayne Butcher, PA-C 05/26/2012, 8:47 AM   I have seen and evaluated the patient this PM along with [name], PA. I agree with Her findings, examination as well as impression recommendations.  47 y/o woman with significant cardiac RFs admitted with c/o CP --> to jaw &neck relieved by NTG.  Has r/o for MI, but continues to note prolonged Pain, making ACS less likely.  TRH is evaluating mild D-dimer elevation.   Her exam is relatively benign with significant reproducible parasternal CP, somewhat relieved with Toradol --> suggest possible costochondritis as an etiology.  I agree with continuing NTG & Rx dose Lovenox for both possible PE & ACS simply because of her RFs and the component of neck/jaw pain with dyspnea. --> as she has not had enough cardiac markers, I think that Nuclear Perfusion ST (Lexiscan Cardiolite) is a reasonable approach.  With her prolonged symptoms, would prefer to keep her inpatient to evaluate.  I think she can be transferred to Telemetry if there is no further CP.   Marykay Lex, M.D., M.S. [facility] [address] [place], [place]  [address]  [phone] Pager # [phone] 05/26/2012 3:42 PM

## 2012-05-26 NOTE — Progress Notes (Signed)
Inpatient Diabetes Program Recommendations  AACE/ADA: New Consensus Statement on Inpatient Glycemic Control (2013)  Target Ranges:  Prepandial:   less than 140 mg/dL      Peak postprandial:   less than 180 mg/dL (1-2 hours)      Critically ill patients:  140 - 180 mg/dL  Results for KAMESHA, HERNE (MRN [id number]) as of 05/26/2012 11:29  Ref. Range 05/09/2012 06:43 05/09/2012 08:40 05/26/2012 07:44  Glucose-Capillary Latest Range: 70-99 mg/dL 409 (H) 811 (H) 914 (H)   Inpatient Diabetes Program Recommendations Insulin - Basal: add Levemir 15 units  Correction (SSI): Increase to MODERATE scale  A1C=10.2 will need follow up with primary MD for management Thank you   Piedad Climes BSN, RN,CDE Inpatient Diabetes Coordinator [phone] (team pager)

## 2012-05-26 NOTE — Progress Notes (Signed)
Patient was having chest pain. PA notified. EKG ordered.

## 2012-05-27 ENCOUNTER — Observation Stay (HOSPITAL_COMMUNITY): Payer: Medicaid Other

## 2012-05-27 DIAGNOSIS — E785 Hyperlipidemia, unspecified: Secondary | ICD-10-CM

## 2012-05-27 DIAGNOSIS — I200 Unstable angina: Secondary | ICD-10-CM

## 2012-05-27 DIAGNOSIS — R079 Chest pain, unspecified: Secondary | ICD-10-CM

## 2012-05-27 DIAGNOSIS — R9389 Abnormal findings on diagnostic imaging of other specified body structures: Secondary | ICD-10-CM

## 2012-05-27 DIAGNOSIS — Z8249 Family history of ischemic heart disease and other diseases of the circulatory system: Secondary | ICD-10-CM

## 2012-05-27 LAB — GLUCOSE, CAPILLARY
Glucose-Capillary: 293 mg/dL — ABNORMAL HIGH (ref 70–99)
Glucose-Capillary: 182 mg/dL — ABNORMAL HIGH (ref 70–99)

## 2012-05-27 MED ORDER — WHITE PETROLATUM GEL
Status: AC
  Filled 2012-05-27: qty 5

## 2012-05-27 MED ORDER — TECHNETIUM TC 99M SESTAMIBI GENERIC - CARDIOLITE
30.0000 | Freq: Once | INTRAVENOUS | Status: AC | PRN
  Administered 2012-05-26 – 2012-05-27 (×2): 30 via INTRAVENOUS

## 2012-05-27 MED ORDER — AMINOPHYLLINE 25 MG/ML IV SOLN
80.0000 mg | Freq: Once | INTRAVENOUS | Status: AC
  Administered 2012-05-27: 40 mg via INTRAVENOUS

## 2012-05-27 MED ORDER — INSULIN DETEMIR 100 UNIT/ML ~~LOC~~ SOLN
20.0000 [IU] | Freq: Every day | SUBCUTANEOUS | Status: DC
  Administered 2012-05-27: 20 [IU] via SUBCUTANEOUS
  Filled 2012-05-27 (×2): qty 0.2

## 2012-05-27 MED ORDER — TECHNETIUM TC 99M SESTAMIBI GENERIC - CARDIOLITE: 30.0000 | Freq: Once | INTRAVENOUS | Status: AC | PRN

## 2012-05-27 MED ORDER — ENOXAPARIN SODIUM 120 MG/0.8ML ~~LOC~~ SOLN
110.0000 mg | Freq: Two times a day (BID) | SUBCUTANEOUS | Status: DC
  Administered 2012-05-27 – 2012-05-28 (×3): 110 mg via SUBCUTANEOUS
  Filled 2012-05-27 (×6): qty 0.8

## 2012-05-27 MED ORDER — CARVEDILOL 3.125 MG PO TABS
3.1250 mg | ORAL_TABLET | Freq: Two times a day (BID) | ORAL | Status: DC
  Administered 2012-05-28 – 2012-05-30 (×5): 3.125 mg via ORAL
  Filled 2012-05-27 (×7): qty 1

## 2012-05-27 NOTE — Progress Notes (Signed)
[facility]  Subjective: She states her chest pain has improved significantly. She believes the Toradol helped. No SOB.  Objective: Vital signs in last 24 hours: Temp:  [97.7 F (36.5 C)-98.5 F (36.9 C)] 97.7 F (36.5 C) (05/17 0722) Pulse Rate:  [93-98] 93 (05/17 0722) Resp:  [14-26] 16 (05/17 0600) BP: (79-165)/(49-92) 110/78 mmHg (05/17 0722) SpO2:  [93 %-97 %] 97 % (05/17 0722)    Intake/Output from previous day: 05/16 0701 - 05/17 0700 In: 1890.5 [P.O.:1020; I.V.:870.5] Out: 2650 [Urine:2650] Intake/Output this shift:    Medications Current Facility-Administered Medications  Medication Dose Route Frequency Provider Last Rate Last Dose  . 0.9 %  sodium chloride infusion   Intravenous Continuous Lonia Blood, MD 50 mL/hr at 05/26/12 1810    . acetaminophen (TYLENOL) tablet 650 mg  650 mg Oral Q6H PRN Eduard Clos, MD       Or  . acetaminophen (TYLENOL) suppository 650 mg  650 mg Rectal Q6H PRN Eduard Clos, MD      . albuterol (PROVENTIL HFA;VENTOLIN HFA) 108 (90 BASE) MCG/ACT inhaler 2 puff  2 puff Inhalation Q6H PRN Eduard Clos, MD      . aspirin EC tablet 325 mg  325 mg Oral Daily Eduard Clos, MD   325 mg at 05/26/12 0956  . buPROPion (WELLBUTRIN XL) 24 hr tablet 150 mg  150 mg Oral Daily Eduard Clos, MD   150 mg at 05/26/12 0956  . darifenacin (ENABLEX) 24 hr tablet 15 mg  15 mg Oral Daily Eduard Clos, MD   15 mg at 05/26/12 0956  . divalproex (DEPAKOTE ER) 24 hr tablet 500 mg  500 mg Oral QHS Eduard Clos, MD   500 mg at 05/26/12 2221  . enoxaparin (LOVENOX) injection 110 mg  110 mg Subcutaneous Q24H Lonia Blood, MD   110 mg at 05/26/12 1808  . gabapentin (NEURONTIN) capsule 300 mg  300 mg Oral BID Eduard Clos, MD   300 mg at 05/26/12 2221  . insulin aspart (novoLOG) injection 0-20 Units  0-20 Units Subcutaneous TID WC Lonia Blood, MD   11 Units at 05/27/12 [id number]   . insulin aspart (novoLOG) injection 0-5 Units  0-5 Units Subcutaneous QHS Lonia Blood, MD   3 Units at 05/26/12 2222  . insulin detemir (LEVEMIR) injection 10 Units  10 Units Subcutaneous QHS Lonia Blood, MD   10 Units at 05/26/12 2221  . lisinopril (PRINIVIL,ZESTRIL) tablet 10 mg  10 mg Oral Daily Eduard Clos, MD   10 mg at 05/26/12 0956  . morphine 2 MG/ML injection 1-2 mg  1-2 mg Intravenous Q3H PRN Lonia Blood, MD      . nitroGLYCERIN 0.2 mg/mL in dextrose 5 % infusion  5 mcg/min Intravenous Titrated Olivia Mackie, MD 3 mL/hr at 05/26/12 0802 10 mcg/min at 05/26/12 0802  . ondansetron (ZOFRAN) tablet 4 mg  4 mg Oral Q6H PRN Eduard Clos, MD       Or  . ondansetron [facility]) injection 4 mg  4 mg Intravenous Q6H PRN Eduard Clos, MD      . oxyCODONE (Oxy IR/ROXICODONE) immediate release tablet 5-10 mg  5-10 mg Oral Q4H PRN Lonia Blood, MD      . PARoxetine (PAXIL-CR) 24 hr tablet 37.5 mg  37.5 mg Oral Daily Eduard Clos, MD   37.5 mg at 05/26/12 0956  . regadenoson (LEXISCAN)  injection SOLN 0.4 mg  0.4 mg Intravenous Once Brittainy Simmons, PA-C      . regadenoson (LEXISCAN) injection SOLN 0.4 mg  0.4 mg Intravenous Once Brittainy Simmons, PA-C      . sodium chloride 0.9 % injection 3 mL  3 mL Intravenous Q12H Eduard Clos, MD   3 mL at 05/26/12 2222    PE: General appearance: alert, cooperative and no distress Lungs: clear to auscultation bilaterally Heart: regular rate and rhythm, S1, S2 normal, no murmur, click, rub or gallop Extremities: no LEE Pulses: 2+ and symmetric Skin: warm and dry Neurologic: Grossly normal  Lab Results:   Recent Labs  05/26/12 0039 05/26/12 0505  WBC 13.3* 12.8*  HGB 13.4 13.6  HCT 37.2 38.3  PLT 296 308   BMET  Recent Labs  05/26/12 0039 05/26/12 0505  NA 133* 134*  K 3.8 4.2  CL 98 99  CO2 25 27  GLUCOSE 357* 302*  BUN 12 11  CREATININE 0.57 0.57  CALCIUM 9.2 9.1  Cardiac  Enzymes Cardiac Panel (last 3 results)  Recent Labs  05/26/12 0505 05/26/12 0950 05/26/12 1547  TROPONINI <0.30 <0.30 <0.30     Assessment/Plan    Principal Problem:   Unstable angina pectoris Active Problems:   DM   Abscess of right breast   Hypertension   Tobacco abuse   HLD (hyperlipidemia)   Family history of early CAD   Agatston coronary artery calcium score greater than 400  Plan: Pt ruled out for MI. Significant improvement in chest pain with addition of torodol. Pt underwent NST earlier today and expreienced SSCP with radiation to neck and bilateral jaw. She also endorsed abdominal discomfort. Symptoms lingered ~2-3 minutes post test before resolving. No significant EKG changes noted. Will await radiologist interpretation.     LOS: 1 day    Brittainy M. Sharol Harness, PA-C 05/27/2012 10:04 AM  I have seen and examined the patient along with BRITTAINY M. SIMMONS, PA-C.  I have reviewed the chart, notes and new data.  I agree with PA's note.  Key new complaints: chest tightness during Lexiscan study, but without ECG changes Key examination changes: no clinical signs of HF or arrhythmia Key new findings / data: nuclear scan is abnormal, with suggestion of possible multivessel CAD. Coupled with presence of coronary calcification on CT, recommend invasive evaluation (coronary angiography on Monday).  PLAN: Will discuss risks/benefits of heart cath+/- PCI. Hold lisinopril Monday morning.  Thurmon Fair, MD, [facility] [phone] 05/27/2012, 4:29 PM

## 2012-05-27 NOTE — Progress Notes (Signed)
[facility]  DAILY PROGRESS NOTE   Subjective:  Dull ache in the chest overnight, but major improvement with nitroglycerin. 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  Coronary CTA yesterday did not show PE (suspect d-dimer increase secondary to surgery and infection). There is, however, significant coronary artery calcification in the LAD.  She did report her pain initially was in the jaw and then felt like an intense heaviness in the chest, throat and jaw.  Objective:  Temp:  [97.8 F (36.6 C)-98.5 F (36.9 C)] 98.3 F (36.8 C) (05/17 0402) Pulse Rate:  [74-98] 98 (05/16 1200) Resp:  [14-26] 14 (05/17 0500) BP: (79-165)/(49-96) 124/77 mmHg (05/17 0500) SpO2:  [92 %-97 %] 93 % (05/17 0402) Weight change:   Intake/Output from previous day: 05/16 0701 - 05/17 0700 In: 1837.5 [P.O.:1020; I.V.:817.5] Out: 2650 [Urine:2650]  Intake/Output from this shift: Total I/O In: 713 [P.O.:180; I.V.:533] Out: 500 [Urine:500]  Medications: Current Facility-Administered Medications  Medication Dose Route Frequency Provider Last Rate Last Dose  . 0.9 %  sodium chloride infusion   Intravenous Continuous Lonia Blood, MD 50 mL/hr at 05/26/12 1810    . acetaminophen (TYLENOL) tablet 650 mg  650 mg Oral Q6H PRN Eduard Clos, MD       Or  . acetaminophen (TYLENOL) suppository 650 mg  650 mg Rectal Q6H PRN Eduard Clos, MD      . albuterol (PROVENTIL HFA;VENTOLIN HFA) 108 (90 BASE) MCG/ACT inhaler 2 puff  2 puff Inhalation Q6H PRN Eduard Clos, MD      . aspirin EC tablet 325 mg  325 mg Oral Daily Eduard Clos, MD   325 mg at 05/26/12 0956  . buPROPion (WELLBUTRIN XL) 24 hr tablet 150 mg  150 mg Oral Daily Eduard Clos, MD   150 mg at 05/26/12 0956  . darifenacin (ENABLEX) 24 hr tablet 15 mg  15 mg Oral Daily Eduard Clos, MD   15 mg at 05/26/12 0956  .  divalproex (DEPAKOTE ER) 24 hr tablet 500 mg  500 mg Oral QHS Eduard Clos, MD   500 mg at 05/26/12 2221  . enoxaparin (LOVENOX) injection 110 mg  110 mg Subcutaneous Q24H Lonia Blood, MD   110 mg at 05/26/12 1808  . gabapentin (NEURONTIN) capsule 300 mg  300 mg Oral BID Eduard Clos, MD   300 mg at 05/26/12 2221  . insulin aspart (novoLOG) injection 0-20 Units  0-20 Units Subcutaneous TID WC Lonia Blood, MD   7 Units at 05/26/12 1749  . insulin aspart (novoLOG) injection 0-5 Units  0-5 Units Subcutaneous QHS Lonia Blood, MD   3 Units at 05/26/12 2222  . insulin detemir (LEVEMIR) injection 10 Units  10 Units Subcutaneous QHS Lonia Blood, MD   10 Units at 05/26/12 2221  . lisinopril (PRINIVIL,ZESTRIL) tablet 10 mg  10 mg Oral Daily Eduard Clos, MD   10 mg at 05/26/12 0956  . morphine 2 MG/ML injection 1-2 mg  1-2 mg Intravenous Q3H PRN Lonia Blood, MD      . nitroGLYCERIN 0.2 mg/mL in dextrose 5 % infusion  5 mcg/min Intravenous Titrated Olivia Mackie, MD 3 mL/hr at 05/26/12 0802 10 mcg/min at 05/26/12 0802  . ondansetron (ZOFRAN) tablet 4 mg  4 mg Oral Q6H PRN Eduard Clos, MD  Or  . ondansetron (ZOFRAN) injection 4 mg  4 mg Intravenous Q6H PRN Eduard Clos, MD      . oxyCODONE (Oxy IR/ROXICODONE) immediate release tablet 5-10 mg  5-10 mg Oral Q4H PRN Lonia Blood, MD      . PARoxetine (PAXIL-CR) 24 hr tablet 37.5 mg  37.5 mg Oral Daily Eduard Clos, MD   37.5 mg at 05/26/12 0956  . regadenoson (LEXISCAN) injection SOLN 0.4 mg  0.4 mg Intravenous Once [employer], PA-C      . regadenoson (LEXISCAN) injection SOLN 0.4 mg  0.4 mg Intravenous Once [employer], PA-C      . sodium chloride 0.9 % injection 3 mL  3 mL Intravenous Q12H Eduard Clos, MD   3 mL at 05/26/12 2222    Physical Exam: General appearance: alert and no distress Neck: no adenopathy, no carotid bruit, no JVD, supple, symmetrical,  trachea midline and thyroid not enlarged, symmetric, no tenderness/mass/nodules Lungs: clear to auscultation bilaterally Heart: regular rate and rhythm, S1, S2 normal, no murmur, click, rub or gallop Abdomen: soft, non-tender; bowel sounds normal; no masses,  no organomegaly Extremities: right breast tender with bandage around the right nipple Pulses: 2+ and symmetric Skin: Skin color, texture, turgor normal. No rashes or lesions Neurologic: Grossly normal  Lab Results: Results for orders placed during the hospital encounter of 05/26/12 (from the past 48 hour(s))  BASIC METABOLIC PANEL     Status: Abnormal   Collection Time    05/26/12 12:39 AM      Result Value Range   Sodium 133 (*) 135 - 145 mEq/L   Potassium 3.8  3.5 - 5.1 mEq/L   Chloride 98  96 - 112 mEq/L   CO2 25  19 - 32 mEq/L   Glucose, Bld 357 (*) 70 - 99 mg/dL   BUN 12  6 - 23 mg/dL   Creatinine, Ser 1.61  0.50 - 1.10 mg/dL   Calcium 9.2  8.4 - 09.6 mg/dL   GFR calc non Af Amer >90  >90 mL/min   GFR calc Af Amer >90  >90 mL/min   Comment:            The eGFR has been calculated     using the CKD EPI equation.     This calculation has not been     validated in all clinical     situations.     eGFR's persistently     <90 mL/min signify     possible Chronic Kidney Disease.  CBC     Status: Abnormal   Collection Time    05/26/12 12:39 AM      Result Value Range   WBC 13.3 (*) 4.0 - 10.5 K/uL   RBC 4.53  3.87 - 5.11 MIL/uL   Hemoglobin 13.4  12.0 - 15.0 g/dL   HCT 04.5  40.9 - 81.1 %   MCV 82.1  78.0 - 100.0 fL   MCH 29.6  26.0 - 34.0 pg   MCHC 36.0  30.0 - 36.0 g/dL   RDW 91.4  78.2 - 95.6 %   Platelets 296  150 - 400 K/uL  TROPONIN I     Status: None   Collection Time    05/26/12 12:39 AM      Result Value Range   Troponin I <0.30  <0.30 ng/mL   Comment:            Due to the release kinetics of cTnI,  a negative result within the first hours     of the onset of symptoms does not rule out      myocardial infarction with certainty.     If myocardial infarction is still suspected,     repeat the test at appropriate intervals.  POCT I-STAT TROPONIN I     Status: None   Collection Time    05/26/12  1:03 AM      Result Value Range   Troponin i, poc 0.00  0.00 - 0.08 ng/mL   Comment 3            Comment: Due to the release kinetics of cTnI,     a negative result within the first hours     of the onset of symptoms does not rule out     myocardial infarction with certainty.     If myocardial infarction is still suspected,     repeat the test at appropriate intervals.  POCT PREGNANCY, URINE     Status: None   Collection Time    05/26/12  3:46 AM      Result Value Range   Preg Test, Ur NEGATIVE  NEGATIVE   Comment:            THE SENSITIVITY OF THIS     METHODOLOGY IS >24 mIU/mL  MRSA PCR SCREENING     Status: None   Collection Time    05/26/12  4:27 AM      Result Value Range   MRSA by PCR NEGATIVE  NEGATIVE   Comment:            The GeneXpert MRSA Assay (FDA     approved for NASAL specimens     only), is one component of a     comprehensive MRSA colonization     surveillance program. It is not     intended to diagnose MRSA     infection nor to guide or     monitor treatment for     MRSA infections.  TROPONIN I     Status: None   Collection Time    05/26/12  5:05 AM      Result Value Range   Troponin I <0.30  <0.30 ng/mL   Comment:            Due to the release kinetics of cTnI,     a negative result within the first hours     of the onset of symptoms does not rule out     myocardial infarction with certainty.     If myocardial infarction is still suspected,     repeat the test at appropriate intervals.  COMPREHENSIVE METABOLIC PANEL     Status: Abnormal   Collection Time    05/26/12  5:05 AM      Result Value Range   Sodium 134 (*) 135 - 145 mEq/L   Potassium 4.2  3.5 - 5.1 mEq/L   Chloride 99  96 - 112 mEq/L   CO2 27  19 - 32 mEq/L   Glucose, Bld 302 (*)  70 - 99 mg/dL   BUN 11  6 - 23 mg/dL   Creatinine, Ser 1.61  0.50 - 1.10 mg/dL   Calcium 9.1  8.4 - 09.6 mg/dL   Total Protein 7.2  6.0 - 8.3 g/dL   Albumin 3.8  3.5 - 5.2 g/dL   AST 10  0 - 37 U/L   ALT 12  0 - 35 U/L  Alkaline Phosphatase 104  39 - 117 U/L   Total Bilirubin 0.3  0.3 - 1.2 mg/dL   GFR calc non Af Amer >90  >90 mL/min   GFR calc Af Amer >90  >90 mL/min   Comment:            The eGFR has been calculated     using the CKD EPI equation.     This calculation has not been     validated in all clinical     situations.     eGFR's persistently     <90 mL/min signify     possible Chronic Kidney Disease.  CBC WITH DIFFERENTIAL     Status: Abnormal   Collection Time    05/26/12  5:05 AM      Result Value Range   WBC 12.8 (*) 4.0 - 10.5 K/uL   RBC 4.59  3.87 - 5.11 MIL/uL   Hemoglobin 13.6  12.0 - 15.0 g/dL   HCT 16.1  09.6 - 04.5 %   MCV 83.4  78.0 - 100.0 fL   MCH 29.6  26.0 - 34.0 pg   MCHC 35.5  30.0 - 36.0 g/dL   RDW 40.9  81.1 - 91.4 %   Platelets 308  150 - 400 K/uL   Neutrophils Relative % 60  43 - 77 %   Neutro Abs 7.7  1.7 - 7.7 K/uL   Lymphocytes Relative 33  12 - 46 %   Lymphs Abs 4.2 (*) 0.7 - 4.0 K/uL   Monocytes Relative 5  3 - 12 %   Monocytes Absolute 0.6  0.1 - 1.0 K/uL   Eosinophils Relative 2  0 - 5 %   Eosinophils Absolute 0.2  0.0 - 0.7 K/uL   Basophils Relative 0  0 - 1 %   Basophils Absolute 0.0  0.0 - 0.1 K/uL  HEMOGLOBIN A1C     Status: Abnormal   Collection Time    05/26/12  5:05 AM      Result Value Range   Hemoglobin A1C 10.2 (*) <5.7 %   Comment: (NOTE)                                                                               According to the ADA Clinical Practice Recommendations for 2011, when     HbA1c is used as a screening test:      >=6.5%   Diagnostic of Diabetes Mellitus               (if abnormal result is confirmed)     5.7-6.4%   Increased risk of developing Diabetes Mellitus     References:Diagnosis and  Classification of Diabetes Mellitus,Diabetes     Care,2011,34(Suppl 1):S62-S69 and Standards of Medical Care in             Diabetes - 2011,Diabetes Care,2011,34 (Suppl 1):S11-S61.   Mean Plasma Glucose 246 (*) <117 mg/dL  D-DIMER, QUANTITATIVE     Status: Abnormal   Collection Time    05/26/12  5:05 AM      Result Value Range   D-Dimer, Quant 0.60 (*) 0.00 - 0.48 ug/mL-FEU   Comment:  AT THE INHOUSE ESTABLISHED CUTOFF     VALUE OF 0.48 ug/mL FEU,     THIS ASSAY HAS BEEN DOCUMENTED     IN THE LITERATURE TO HAVE     A SENSITIVITY AND NEGATIVE     PREDICTIVE VALUE OF AT LEAST     98 TO 99%.  THE TEST RESULT     SHOULD BE CORRELATED WITH     AN ASSESSMENT OF THE CLINICAL     PROBABILITY OF DVT / VTE.  VALPROIC ACID LEVEL     Status: Abnormal   Collection Time    05/26/12  5:05 AM      Result Value Range   Valproic Acid Lvl <10.0 (*) 50.0 - 100.0 ug/mL  GLUCOSE, CAPILLARY     Status: Abnormal   Collection Time    05/26/12  7:44 AM      Result Value Range   Glucose-Capillary 244 (*) 70 - 99 mg/dL  TROPONIN I     Status: None   Collection Time    05/26/12  9:50 AM      Result Value Range   Troponin I <0.30  <0.30 ng/mL   Comment:            Due to the release kinetics of cTnI,     a negative result within the first hours     of the onset of symptoms does not rule out     myocardial infarction with certainty.     If myocardial infarction is still suspected,     repeat the test at appropriate intervals.  GLUCOSE, CAPILLARY     Status: Abnormal   Collection Time    05/26/12 12:05 PM      Result Value Range   Glucose-Capillary 347 (*) 70 - 99 mg/dL  TROPONIN I     Status: None   Collection Time    05/26/12  3:47 PM      Result Value Range   Troponin I <0.30  <0.30 ng/mL   Comment:            Due to the release kinetics of cTnI,     a negative result within the first hours     of the onset of symptoms does not rule out     myocardial infarction with certainty.      If myocardial infarction is still suspected,     repeat the test at appropriate intervals.  GLUCOSE, CAPILLARY     Status: Abnormal   Collection Time    05/26/12  5:20 PM      Result Value Range   Glucose-Capillary 241 (*) 70 - 99 mg/dL  GLUCOSE, CAPILLARY     Status: Abnormal   Collection Time    05/26/12  9:08 PM      Result Value Range   Glucose-Capillary 279 (*) 70 - 99 mg/dL  GLUCOSE, CAPILLARY     Status: Abnormal   Collection Time    05/26/12 10:19 PM      Result Value Range   Glucose-Capillary 292 (*) 70 - 99 mg/dL    Imaging: Ct Angio Chest Pe W/cm &/or Wo Cm  05/26/2012   *RADIOLOGY REPORT*  Clinical Data: Chest pain and a positive D-dimer  CT ANGIOGRAPHY CHEST  Technique:  Multidetector CT imaging of the chest using the standard protocol during bolus administration of intravenous contrast. Multiplanar reconstructed images including MIPs were obtained and reviewed to evaluate the vascular anatomy.  Contrast: OMNIPAQUE IOHEXOL 350 MG/ML SOLN  Comparison: 01/23/2009  Findings: Lungs/pleura: No pleural effusion identified.  There is no airspace consolidation identified.  Plate-like atelectasis is identified in both lung bases.  4 mm subpleural nodules identified in the left upper lobe, image 37/series 5.  This is stable from previous exam and likely benign.  4 mm parenchymal nodule in the left upper lobe is also stable from previous exam and likely benign.  There is no airspace consolidation identified.  Heart/Mediastinum: Heart size is normal.  There is no pericardial effusion identified.  There is no enlarged mediastinal or hilar lymph nodes.  The main pulmonary artery is patent.  No lumbar or segmental pulmonary artery filling defects identified to suggest pulmonary embolus. Prominent coronary artery calcifications involve the LAD.  Upper abdomen: There are no acute findings identified within the upper abdomen.  Bones/Musculoskeletal:  There are prominent axillary lymph  nodes identified bilaterally.   Review of the visualized bony structures is unremarkable.  IMPRESSION:  1.  No acute pulmonary embolus. 2.  Small pulmonary nodules are stable from previous exam and likely benign. 3.  Prominent coronary artery calcifications involving the LAD.   Original Report Authenticated By: Signa Kell, M.D.   Dg Chest Port 1 View  05/26/2012   *RADIOLOGY REPORT*  Clinical Data: Chest pain, shortness of breath.  PORTABLE CHEST - 1 VIEW  Comparison: 09/16/2011  Findings: Mild peribronchial thickening.  No confluent opacities. Heart is normal size.  No effusions or acute bony abnormality.  IMPRESSION: Mild bronchitic changes.   Original Report Authenticated By: Charlett Nose, M.D.    Assessment:  1. Principal Problem: 2.   Chest pain with moderate risk for cardiac etiology 3. Active Problems: 4.   Hypertension 5.   Tobacco abuse 6.   HLD (hyperlipidemia) 7.   Family history of early CAD 8.   Plan:  1. Her description of pain symptoms today seem much more like unstable angina to me. She is a setup for coronary disease with heavy smoking history, diabetes which is poorly controlled, HTN, HPL, obesity and strong family history of premature CAD in mother. She also has ongoing chronic inflammation and soft tissue infection with leukocytosis suggesting systemic inflammation which may predispose her to unstable angina. The only argument against this being cardiac is negative cardiac markers (however, this can be explained if there was early intervention that aborted tissue necrosis).  I agree with continuing nitroglycerin and lovenox.  Plan for NST today, however, if she has recurrent angina, I would have a low threshold for cardiac catheterization.  Time Spent Directly with Patient:  15 minutes  Length of Stay:  LOS: 1 day   Chrystie Nose, MD, [facility] Attending Cardiologist [facility]  HILTY,Kenneth C 05/27/2012, 6:03 AM

## 2012-05-27 NOTE — Progress Notes (Signed)
[facility] Progress Note [place] TEAM 1 - Stepdown/ICU TEAM   Cassidy [name] ION:[id number] DOB: 09/09/1965 DOA: 05/26/2012 PCP: Quitman Livings, MD  Brief narrative: 47 y.o. female with history of DM 2, hypertension, ongoing tobacco abuse who presented to the ER with chest pain. Chest pain was retrosternal pressure-like radiating to her neck. Pain increased on deep inspiration and without any associated shortness of breath fever chills or productive cough. In the ER patient's chest pain was relieved after IV nitroglycerin infusion was started. EKG and cardiac markers and chest x-ray were unremarkable.   Assessment/Plan:  Chest pain D-Dimer + >> CT angio chest w/o evidence of PE, but did reveal signif coronary artery calcification of the LAD - Nuc Med stress test reveals 2 distinct areas of inducible ischemia - cardiac cath will be needed given these findings and her risk factor profile - Cardiology is following, and planning for cath Monday - resume full dose anticoag - cont nitro gtt - is on ASA - add low dose BB - hold ACE in anticipation of coming cath  Uncontrolled diabetes mellitus type 2 A1c 10.2 - CBG is slowly improving - I will adjust her tx regimen further and follow - pt will likely need to d/c on insulin (was on oral meds alone prior to admit)   Recently drained right breast Abscess  No acute complicating issues   Hypertension  Reasonably well controlled at this time - follow w/ addition of low dose coreg  Tobacco abuse strongly advised patient to quit smoking  Hyperlipidemia Check lipid panel in AM - will likely require tx  Bipolar D/O Well compensated   Dental carries   Obesity  Code Status: FULL Family Communication: Discussed with patient and son at bedside Disposition Plan: SDU  Consultants: Cardiology - [facility]  Procedures: 5/16 - CT angio chest - no pulmonary embolus - prominent coronary artery calcification involving LAD 5/16 - nuc med stress - 2  foci of possible inducible ischemia involving the apical to mid anterior and mid to basilar inferior walls   Antibiotics: none  DVT prophylaxis: lovenox  HPI/Subjective: Patient is resting comfortably at this time.  She is experiencing no further chest pain on the nitro drip.  She denies headache nausea vomiting shortness of breath.  I have discussed her test findings with her and the details of a generic cardiac catheterization.  Objective: Blood pressure 133/84, pulse 94, temperature 98.4 F (36.9 C), temperature source Oral, resp. rate 16, height 5' 7.6" (1.717 m), weight 109 kg (240 lb 4.8 oz), last menstrual period 05/13/2012, SpO2 97.00%.  Intake/Output Summary (Last 24 hours) at 05/27/12 1533 Last data filed at 05/27/12 1400  Gross per 24 hour  Intake   1532 ml  Output   5000 ml  Net  -3468 ml    Exam: General: No acute respiratory distress Lungs: Clear to auscultation bilaterally without wheezes or crackles Cardiovascular: Regular rate and rhythm without murmur gallop or rub normal S1 and S2 Abdomen: Nontender, nondistended, soft, bowel sounds positive, no rebound, no ascites, no appreciable mass Extremities: No significant cyanosis, clubbing, or edema bilateral lower extremities  Data Reviewed: Basic Metabolic Panel:  Recent Labs Lab 05/26/12 0039 05/26/12 0505  NA 133* 134*  K 3.8 4.2  CL 98 99  CO2 25 27  GLUCOSE 357* 302*  BUN 12 11  CREATININE 0.57 0.57  CALCIUM 9.2 9.1   Liver Function Tests:  Recent Labs Lab 05/26/12 0505  AST 10  ALT 12  ALKPHOS 104  BILITOT 0.3  PROT 7.2  ALBUMIN 3.8   CBC:  Recent Labs Lab 05/26/12 0039 05/26/12 0505  WBC 13.3* 12.8*  NEUTROABS  --  7.7  HGB 13.4 13.6  HCT 37.2 38.3  MCV 82.1 83.4  PLT 296 308   Cardiac Enzymes:  Recent Labs Lab 05/26/12 0039 05/26/12 0505 05/26/12 0950 05/26/12 1547  TROPONINI <0.30 <0.30 <0.30 <0.30   CBG:  Recent Labs Lab 05/26/12 1720 05/26/12 2108  05/26/12 2219 05/27/12 0727 05/27/12 1222  GLUCAP 241* 279* 292* 293* 182*    Recent Results (from the past 240 hour(s))  MRSA PCR SCREENING     Status: None   Collection Time    05/26/12  4:27 AM      Result Value Range Status   MRSA by PCR NEGATIVE  NEGATIVE Final   Comment:            The GeneXpert MRSA Assay (FDA     approved for NASAL specimens     only), is one component of a     comprehensive MRSA colonization     surveillance program. It is not     intended to diagnose MRSA     infection nor to guide or     monitor treatment for     MRSA infections.     Studies:  Recent x-ray studies have been reviewed in detail by the Attending Physician  Scheduled Meds:  Scheduled Meds: . aspirin EC  325 mg Oral Daily  . buPROPion  150 mg Oral Daily  . darifenacin  15 mg Oral Daily  . divalproex  500 mg Oral QHS  . enoxaparin (LOVENOX) injection  110 mg Subcutaneous Q24H  . gabapentin  300 mg Oral BID  . insulin aspart  0-20 Units Subcutaneous TID WC  . insulin aspart  0-5 Units Subcutaneous QHS  . insulin detemir  10 Units Subcutaneous QHS  . lisinopril  10 mg Oral Daily  . PARoxetine  37.5 mg Oral Daily  . regadenoson  0.4 mg Intravenous Once  . sodium chloride  3 mL Intravenous Q12H   Continuous Infusions: . sodium chloride 50 mL/hr at 05/26/12 1810  . nitroGLYCERIN 10 mcg/min (05/26/12 0802)    Time spent on care of this patient:   [facility]  [facility] Office  [phone] Pager - Text Page per Loretha Stapler as per below:  On-Call/Text Page:      [email or web address]      password [email or web address]  If 7PM-7AM, please contact night-coverage [email or web address] Password [email or web address] 05/27/2012, 3:33 PM   LOS: 1 day

## 2012-05-27 NOTE — Progress Notes (Signed)
ANTICOAGULATION CONSULT NOTE - Initial Consult  Pharmacy Consult for ACS Indication: chest pain/ACS  No Known Allergies  Patient Measurements: Height: 5' 7.6" (171.7 cm) Weight: 240 lb 4.8 oz (109 kg) IBW/kg (Calculated) : 62.98 Heparin Dosing Weight: 109 kg  Vital Signs: Temp: 98.4 F (36.9 C) (05/17 1243) Temp src: Oral (05/17 1243) BP: 133/84 mmHg (05/17 1227) Pulse Rate: 94 (05/17 1243)  Labs:  Recent Labs  05/26/12 0039 05/26/12 0505 05/26/12 0950 05/26/12 1547  HGB 13.4 13.6  --   --   HCT 37.2 38.3  --   --   PLT 296 308  --   --   CREATININE 0.57 0.57  --   --   TROPONINI <0.30 <0.30 <0.30 <0.30    Estimated Creatinine Clearance: 110.5 ml/min (by C-G formula based on Cr of 0.57).   Medical History: Past Medical History  Diagnosis Date  . Hypertension   . GERD (gastroesophageal reflux disease)   . Breast abscess     recurrent; on right  . High cholesterol   . Type II diabetes mellitus   . Anemia   . Sickle cell trait   . Fibromyalgia   . Genital warts   . Constipation   . Diabetic peripheral neuropathy   . Depression   . Bipolar depression   . PTSD (post-traumatic stress disorder)   . Complication of anesthesia     deep pain in legs and arms for 3 days post anesthesia   . Asthma     seasonal  . Tooth caries     pt. states she will have an extraction in 5/14 on tooth on bottom right    Medications:  Scheduled:  . aspirin EC  325 mg Oral Daily  . buPROPion  150 mg Oral Daily  . darifenacin  15 mg Oral Daily  . divalproex  500 mg Oral QHS  . enoxaparin (LOVENOX) injection  110 mg Subcutaneous Q12H  . gabapentin  300 mg Oral BID  . insulin aspart  0-20 Units Subcutaneous TID WC  . insulin aspart  0-5 Units Subcutaneous QHS  . insulin detemir  10 Units Subcutaneous QHS  . lisinopril  10 mg Oral Daily  . PARoxetine  37.5 mg Oral Daily  . regadenoson  0.4 mg Intravenous Once  . sodium chloride  3 mL Intravenous Q12H    Assessment: 47  yo female admitted with chest pain.  Had been on enoxaparin for DVT prophylaxis previously.  Will increase to 1 mg/kg q 12 hrs.  Received 110 mg dose last night at 1800.  Goal of Therapy:  Anti-Xa level 0.6-1.2 units/ml 4hrs after LMWH dose given Monitor platelets by anticoagulation protocol: Yes   Plan:  1. Increase Lovenox to 110 mg sq q 12 hrs. 2. Will f/u plans for further cardiac workup. 3. CBC q 72 hrs while on Lovenox.  Tad Moore, BCPS  Clinical Pharmacist Pager [phone]  05/27/2012 3:49 PM

## 2012-05-28 ENCOUNTER — Other Ambulatory Visit (HOSPITAL_COMMUNITY): Payer: Medicaid Other

## 2012-05-28 LAB — GLUCOSE, CAPILLARY: Glucose-Capillary: 240 mg/dL — ABNORMAL HIGH (ref 70–99)

## 2012-05-28 LAB — LIPID PANEL
Cholesterol: 176 mg/dL (ref 0–200)
Total CHOL/HDL Ratio: 4.6 RATIO
Triglycerides: 149 mg/dL (ref <150)

## 2012-05-28 MED ORDER — SODIUM CHLORIDE 0.9 % IV SOLN
INTRAVENOUS | Status: DC
  Administered 2012-05-29: 04:00:00 via INTRAVENOUS

## 2012-05-28 MED ORDER — SODIUM CHLORIDE 0.9 % IJ SOLN: 3.0000 mL | Freq: Two times a day (BID) | INTRAMUSCULAR | Status: DC

## 2012-05-28 MED ORDER — ALUM & MAG HYDROXIDE-SIMETH 200-200-20 MG/5ML PO SUSP: 30.0000 mL | ORAL | Status: DC | PRN

## 2012-05-28 MED ORDER — INSULIN DETEMIR 100 UNIT/ML ~~LOC~~ SOLN
20.0000 [IU] | Freq: Two times a day (BID) | SUBCUTANEOUS | Status: DC
  Administered 2012-05-28 (×2): 20 [IU] via SUBCUTANEOUS
  Filled 2012-05-28 (×4): qty 0.2

## 2012-05-28 MED ORDER — SIMVASTATIN 20 MG PO TABS
20.0000 mg | ORAL_TABLET | Freq: Every day | ORAL | Status: DC
  Administered 2012-05-28 – 2012-05-29 (×2): 20 mg via ORAL
  Filled 2012-05-28 (×3): qty 1

## 2012-05-28 MED ORDER — SENNOSIDES-DOCUSATE SODIUM 8.6-50 MG PO TABS
1.0000 | ORAL_TABLET | Freq: Two times a day (BID) | ORAL | Status: DC
  Administered 2012-05-28 (×2): 1 via ORAL
  Filled 2012-05-28 (×2): qty 1

## 2012-05-28 MED ORDER — SODIUM CHLORIDE 0.9 % IV SOLN: 250.0000 mL | INTRAVENOUS | Status: DC | PRN

## 2012-05-28 MED ORDER — ASPIRIN 81 MG PO CHEW
324.0000 mg | CHEWABLE_TABLET | ORAL | Status: AC
  Administered 2012-05-29: 324 mg via ORAL
  Filled 2012-05-28: qty 4

## 2012-05-28 MED ORDER — SODIUM CHLORIDE 0.9 % IJ SOLN: 3.0000 mL | INTRAMUSCULAR | Status: DC | PRN

## 2012-05-28 NOTE — Progress Notes (Signed)
[facility]  Subjective: No further chest pain. She has headache from NTG. She feels a bit constipated.   Objective: Vital signs in last 24 hours: Temp:  [97.8 F (36.6 C)-98.4 F (36.9 C)] 98.1 F (36.7 C) (05/18 0739) Pulse Rate:  [83-94] 83 (05/18 0739) Resp:  [16-18] 16 (05/18 0600) BP: (101-163)/(55-96) 101/82 mmHg (05/18 0739) SpO2:  [96 %-97 %] 97 % (05/18 0739)    Intake/Output from previous day: 05/17 0701 - 05/18 0700 In: 2146 [P.O.:920; I.V.:1226] Out: 4751 [Urine:4750; Emesis/NG output:1] Intake/Output this shift:    Medications Current Facility-Administered Medications  Medication Dose Route Frequency Provider Last Rate Last Dose  . 0.9 %  sodium chloride infusion   Intravenous Continuous Lonia Blood, MD 50 mL/hr at 05/26/12 1810    . acetaminophen (TYLENOL) tablet 650 mg  650 mg Oral Q6H PRN Eduard Clos, MD   650 mg at 05/27/12 2227   Or  . acetaminophen (TYLENOL) suppository 650 mg  650 mg Rectal Q6H PRN Eduard Clos, MD      . albuterol (PROVENTIL HFA;VENTOLIN HFA) 108 (90 BASE) MCG/ACT inhaler 2 puff  2 puff Inhalation Q6H PRN Eduard Clos, MD      . aspirin EC tablet 325 mg  325 mg Oral Daily Eduard Clos, MD   325 mg at 05/27/12 1224  . buPROPion (WELLBUTRIN XL) 24 hr tablet 150 mg  150 mg Oral Daily Eduard Clos, MD   150 mg at 05/27/12 1225  . carvedilol (COREG) tablet 3.125 mg  3.125 mg Oral BID WC Lonia Blood, MD      . darifenacin (ENABLEX) 24 hr tablet 15 mg  15 mg Oral Daily Eduard Clos, MD   15 mg at 05/27/12 1225  . divalproex (DEPAKOTE ER) 24 hr tablet 500 mg  500 mg Oral QHS Eduard Clos, MD   500 mg at 05/27/12 2224  . enoxaparin (LOVENOX) injection 110 mg  110 mg Subcutaneous Q12H Gwenlyn Found Carney, RPH   110 mg at 05/28/12 0550  . gabapentin (NEURONTIN) capsule 300 mg  300 mg Oral BID Eduard Clos, MD   300 mg at 05/27/12 2225  . insulin aspart  (novoLOG) injection 0-20 Units  0-20 Units Subcutaneous TID WC Lonia Blood, MD   11 Units at 05/27/12 1800  . insulin aspart (novoLOG) injection 0-5 Units  0-5 Units Subcutaneous QHS Lonia Blood, MD   3 Units at 05/27/12 2226  . insulin detemir (LEVEMIR) injection 20 Units  20 Units Subcutaneous QHS Lonia Blood, MD   20 Units at 05/27/12 2225  . morphine 2 MG/ML injection 1-2 mg  1-2 mg Intravenous Q3H PRN Lonia Blood, MD      . nitroGLYCERIN 0.2 mg/mL in dextrose 5 % infusion  5 mcg/min Intravenous Titrated Olivia Mackie, MD 3 mL/hr at 05/28/12 0045 10 mcg/min at 05/28/12 0045  . ondansetron (ZOFRAN) tablet 4 mg  4 mg Oral Q6H PRN Eduard Clos, MD       Or  . ondansetron [facility]) injection 4 mg  4 mg Intravenous Q6H PRN Eduard Clos, MD      . oxyCODONE (Oxy IR/ROXICODONE) immediate release tablet 5-10 mg  5-10 mg Oral Q4H PRN Lonia Blood, MD   10 mg at 05/27/12 2227  . PARoxetine (PAXIL-CR) 24 hr tablet 37.5 mg  37.5 mg Oral Daily Eduard Clos, MD   37.5 mg at  05/27/12 1226  . regadenoson (LEXISCAN) injection SOLN 0.4 mg  0.4 mg Intravenous Once Elese Rane, PA-C      . sodium chloride 0.9 % injection 3 mL  3 mL Intravenous Q12H Eduard Clos, MD   3 mL at 05/27/12 1228    PE: General appearance: alert, cooperative and no distress Lungs: clear to auscultation bilaterally Heart: regular rate and rhythm Extremities: no LEE Pulses: 2+ and symmetric Skin: warm and dry Neurologic: Grossly normal  Lab Results:   Recent Labs  05/26/12 0039 05/26/12 0505  WBC 13.3* 12.8*  HGB 13.4 13.6  HCT 37.2 38.3  PLT 296 308   BMET  Recent Labs  05/26/12 0039 05/26/12 0505  NA 133* 134*  K 3.8 4.2  CL 98 99  CO2 25 27  GLUCOSE 357* 302*  BUN 12 11  CREATININE 0.57 0.57  CALCIUM 9.2 9.1   PT/INR No results found for this basename: LABPROT, INR,  in the last 72 hours Cholesterol  Recent Labs  05/28/12 0605  CHOL 176    Cardiac Enzymes Cardiac Panel (last 3 results)  Recent Labs  05/26/12 0505 05/26/12 0950 05/26/12 1547  TROPONINI <0.30 <0.30 <0.30    Studies/Results: NST 05/27/16 Findings: Rest images demonstrate a suggestion of apical segment  anteroseptal fixed defect. This could represent soft tissue  attenuation or scar from prior infarct.  Stress images demonstrate 2 subtle areas of possible reversibility.  The first is in the apical to mid segment of the anterior wall.  The second is in the mid to basilar segment of the inferior wall.  Evaluation of wall motion demonstrates normal left ventricular wall  motion and thickening.  Ejection fraction is estimated at 53%. End diastolic volume of 96  cc. End systolic volume of 45 cc.  IMPRESSION:  1. 2 foci of possible inducible ischemia involving the apical to  mid anterior and mid to basilar inferior walls. Especially given  the extent of markedly age advanced coronary artery atherosclerosis  on prior CT, inducible ischemia cannot be excluded.  2. Soft tissue attenuation artifact versus remote apical segment  anteroseptal infarct.    Assessment/Plan  Principal Problem:   Unstable angina pectoris Active Problems:   DM   Abscess of right breast   Hypertension   Tobacco abuse   HLD (hyperlipidemia)   Family history of early CAD   Agatston coronary artery calcium score greater than 400  Plan: Abnormal nuclear stress test yesterday. The patient's symptoms were reproduced after administration of regadenosen during stress test. There were 2 foci of possible inducible ischemia involving the apical to mid anterior and mid to basilar inferior walls. Considering result of stress test and positive cardiac risk factors: obesity, HTN, HLD, DM, family hx and tobacco abuse, will need to assess coronaries via cardiac cath. Plan for tomorrow. Will make NPO at midnight. HR and BP stable. NSR on telemetry. Pain is controlled. Can titrate down IV NTG  due to HA. Will give colace for constipation.    LOS: 2 days    Karinda Cabriales M. Delmer Islam 05/28/2012 7:49 AM

## 2012-05-28 NOTE — Progress Notes (Signed)
[facility] Progress Note  TEAM 1 - Stepdown/ICU TEAM   TEMICA RIGHETTI ZOX:[id number] DOB: [birth year] DOA: 05/26/2012 PCP: Quitman Livings, MD  Brief narrative: 47 y.o. female with history of DM 2, hypertension, ongoing tobacco abuse who presented to the ER with chest pain. Chest pain was retrosternal pressure-like radiating to her neck. Pain increased on deep inspiration and without any associated shortness of breath fever chills or productive cough. In the ER patient's chest pain was relieved after IV nitroglycerin infusion was started. EKG and cardiac markers and chest x-ray were unremarkable.   Assessment/Plan:  Chest pain D-Dimer + >> CT angio chest w/o evidence of PE, but did reveal signif coronary artery calcification of the LAD - Nuc Med stress test revealed 2 distinct areas of inducible ischemia - cardiac cath will be needed given these findings and her risk factor profile - Cardiology is following, and planning for cath Monday - full dose anticoag - nitro gtt - ASA - low dose BB - holding ACE in anticipation of coming cath  Uncontrolled diabetes mellitus type 2 A1c 10.2 - CBG is slowly improving - I will again adjust her tx regimen and follow - pt will likely need to d/c on insulin (was on oral meds alone prior to admit)   Recently drained right breast Abscess  No acute complicating issues   Hypertension  Reasonably well controlled at this time - no change in tx plan today  Tobacco abuse strongly advised patient to quit smoking  Hyperlipidemia LDL is >100 - initiate tx - LFTs normal this hospitalizzation  Bipolar D/O Well compensated   Dental carries   Obesity  Code Status: FULL Family Communication: Discussed with patient and son at bedside Disposition Plan: SDU  Consultants: Cardiology - [facility]  Procedures: 5/16 - CT angio chest - no pulmonary embolus - prominent coronary artery calcification involving LAD 5/16 - nuc med stress - 2 foci of possible  inducible ischemia involving the apical to mid anterior and mid to basilar inferior walls   Antibiotics: none  DVT prophylaxis: lovenox  HPI/Subjective: Patient is resting comfortably.  She denies chest pain overnight.  She denies shortness of breath nausea or vomiting.  Objective: Blood pressure 124/79, pulse 83, temperature 98.1 F (36.7 C), temperature source Oral, resp. rate 16, height 5' 7.6" (1.717 m), weight 109 kg (240 lb 4.8 oz), last menstrual period 05/13/2012, SpO2 97.00%.  Intake/Output Summary (Last 24 hours) at 05/28/12 1054 Last data filed at 05/28/12 0900  Gross per 24 hour  Intake   2345 ml  Output   5651 ml  Net  -3306 ml    Exam: General: No acute respiratory distress Lungs: Clear to auscultation bilaterally without wheezes or crackles Cardiovascular: Regular rate and rhythm without murmur gallop or rub  Abdomen: Nontender, nondistended, soft, bowel sounds positive, no rebound, no ascites, no appreciable mass Extremities: No significant cyanosis, clubbing, or edema bilateral lower extremities  Data Reviewed: Basic Metabolic Panel:  Recent Labs Lab 05/26/12 0039 05/26/12 0505  NA 133* 134*  K 3.8 4.2  CL 98 99  CO2 25 27  GLUCOSE 357* 302*  BUN 12 11  CREATININE 0.57 0.57  CALCIUM 9.2 9.1   Liver Function Tests:  Recent Labs Lab 05/26/12 0505  AST 10  ALT 12  ALKPHOS 104  BILITOT 0.3  PROT 7.2  ALBUMIN 3.8   CBC:  Recent Labs Lab 05/26/12 0039 05/26/12 0505  WBC 13.3* 12.8*  NEUTROABS  --  7.7  HGB 13.4 13.6  HCT 37.2 38.3  MCV 82.1 83.4  PLT 296 308   Cardiac Enzymes:  Recent Labs Lab 05/26/12 0039 05/26/12 0505 05/26/12 0950 05/26/12 1547  TROPONINI <0.30 <0.30 <0.30 <0.30   CBG:  Recent Labs Lab 05/27/12 0727 05/27/12 1222 05/27/12 1726 05/27/12 2213 05/28/12 0743  GLUCAP 293* 182* 274* 261* 240*    Recent Results (from the past 240 hour(s))  MRSA PCR SCREENING     Status: None   Collection Time     05/26/12  4:27 AM      Result Value Range Status   MRSA by PCR NEGATIVE  NEGATIVE Final   Comment:            The GeneXpert MRSA Assay (FDA     approved for NASAL specimens     only), is one component of a     comprehensive MRSA colonization     surveillance program. It is not     intended to diagnose MRSA     infection nor to guide or     monitor treatment for     MRSA infections.     Studies:  Recent x-ray studies have been reviewed in detail by the Attending Physician  Scheduled Meds:  Scheduled Meds: . aspirin EC  325 mg Oral Daily  . buPROPion  150 mg Oral Daily  . carvedilol  3.125 mg Oral BID WC  . darifenacin  15 mg Oral Daily  . divalproex  500 mg Oral QHS  . enoxaparin (LOVENOX) injection  110 mg Subcutaneous Q12H  . gabapentin  300 mg Oral BID  . insulin aspart  0-20 Units Subcutaneous TID WC  . insulin aspart  0-5 Units Subcutaneous QHS  . insulin detemir  20 Units Subcutaneous QHS  . PARoxetine  37.5 mg Oral Daily  . regadenoson  0.4 mg Intravenous Once  . sodium chloride  3 mL Intravenous Q12H   Continuous Infusions: . sodium chloride 10 mL/hr at 05/28/12 0830  . nitroGLYCERIN 10 mcg/min (05/28/12 0045)    Time spent on care of this patient:   [facility] T  [facility] Office  [phone] Pager - Text Page per Loretha Stapler as per below:  On-Call/Text Page:      [email or web address]      password [email or web address]  If 7PM-7AM, please contact night-coverage [email or web address] Password [email or web address] 05/28/2012, 10:54 AM   LOS: 2 days

## 2012-05-28 NOTE — Progress Notes (Signed)
Pt. Seen and examined. Agree with the NP/PA-C note as written.  No chest pain overnight. NST yesterday showed a couple of areas of mild reversibility. ?multivessel CAD, she certainly has significant coronary calcium. I agree with cardiac catheterization tomorrow.  She was informed of the risks and benefits today and will provide informed consent for the procedure tomorrow.  Chrystie Nose, MD, [facility] Attending Cardiologist [facility]

## 2012-05-29 ENCOUNTER — Encounter (HOSPITAL_COMMUNITY)
Admission: EM | Disposition: A | Discharge status: Home / Self Care | Payer: Self-pay | Source: Home / Self Care | Attending: Internal Medicine

## 2012-05-29 DIAGNOSIS — I2510 Atherosclerotic heart disease of native coronary artery without angina pectoris: Secondary | ICD-10-CM

## 2012-05-29 HISTORY — PX: LEFT HEART CATHETERIZATION WITH CORONARY ANGIOGRAM: SHX5451

## 2012-05-29 LAB — BASIC METABOLIC PANEL
BUN: 9 mg/dL (ref 6–23)
CO2: 24 mEq/L (ref 19–32)
Chloride: 100 mEq/L (ref 96–112)
Creatinine, Ser: 0.66 mg/dL (ref 0.50–1.10)
GFR calc Af Amer: >90 mL/min (ref >90)
Glucose, Bld: 227 mg/dL — ABNORMAL HIGH (ref 70–99)
Potassium: 4.3 mEq/L (ref 3.5–5.1)

## 2012-05-29 LAB — CBC
HCT: 41.7 % (ref 36.0–46.0)
Hemoglobin: 13.2 g/dL (ref 12.0–15.0)
Hemoglobin: 14.6 g/dL (ref 12.0–15.0)
MCH: 29.7 pg (ref 26.0–34.0)
MCV: 83.2 fL (ref 78.0–100.0)
Platelets: 327 10*3/uL (ref 150–400)
RBC: 4.45 MIL/uL (ref 3.87–5.11)
RBC: 5.01 MIL/uL (ref 3.87–5.11)
WBC: 11.5 10*3/uL — ABNORMAL HIGH (ref 4.0–10.5)

## 2012-05-29 SURGERY — LEFT HEART CATHETERIZATION WITH CORONARY ANGIOGRAM
Anesthesia: LOCAL

## 2012-05-29 MED ORDER — HEPARIN (PORCINE) IN NACL 2-0.9 UNIT/ML-% IJ SOLN
INTRAMUSCULAR | Status: AC
  Filled 2012-05-29: qty 1000

## 2012-05-29 MED ORDER — MIDAZOLAM HCL 2 MG/2ML IJ SOLN
INTRAMUSCULAR | Status: AC
  Filled 2012-05-29: qty 2

## 2012-05-29 MED ORDER — BIVALIRUDIN 250 MG IV SOLR
INTRAVENOUS | Status: AC
  Filled 2012-05-29: qty 250

## 2012-05-29 MED ORDER — PRASUGREL HCL 10 MG PO TABS
ORAL_TABLET | ORAL | Status: AC
  Filled 2012-05-29: qty 6

## 2012-05-29 MED ORDER — ONDANSETRON HCL 4 MG/2ML IJ SOLN: 4.0000 mg | Freq: Four times a day (QID) | INTRAMUSCULAR | Status: DC | PRN

## 2012-05-29 MED ORDER — LIDOCAINE HCL (PF) 1 % IJ SOLN
INTRAMUSCULAR | Status: AC
  Filled 2012-05-29: qty 30

## 2012-05-29 MED ORDER — INSULIN DETEMIR 100 UNIT/ML ~~LOC~~ SOLN
28.0000 [IU] | Freq: Two times a day (BID) | SUBCUTANEOUS | Status: DC
  Administered 2012-05-29 – 2012-05-30 (×2): 28 [IU] via SUBCUTANEOUS
  Filled 2012-05-29 (×3): qty 0.28

## 2012-05-29 MED ORDER — SODIUM CHLORIDE 0.9 % IV SOLN
0.2500 mg/kg/h | INTRAVENOUS | Status: AC
  Administered 2012-05-29: 0.25 mg/kg/h via INTRAVENOUS
  Filled 2012-05-29: qty 250

## 2012-05-29 MED ORDER — PRASUGREL HCL 10 MG PO TABS
10.0000 mg | ORAL_TABLET | Freq: Every day | ORAL | Status: DC
  Administered 2012-05-29 – 2012-05-30 (×2): 10 mg via ORAL
  Filled 2012-05-29 (×2): qty 1

## 2012-05-29 MED ORDER — SODIUM CHLORIDE 0.9 % IV SOLN: INTRAVENOUS | Status: DC

## 2012-05-29 MED ORDER — ASPIRIN EC 81 MG PO TBEC: 81.0000 mg | DELAYED_RELEASE_TABLET | Freq: Every day | ORAL | Status: DC

## 2012-05-29 MED ORDER — NITROGLYCERIN IN D5W 200-5 MCG/ML-% IV SOLN: 2.0000 ug/min | INTRAVENOUS | Status: DC

## 2012-05-29 MED ORDER — ACETAMINOPHEN 325 MG PO TABS: 650.0000 mg | ORAL_TABLET | ORAL | Status: DC | PRN

## 2012-05-29 MED ORDER — FENTANYL CITRATE 0.05 MG/ML IJ SOLN
INTRAMUSCULAR | Status: AC
  Filled 2012-05-29: qty 2

## 2012-05-29 MED ORDER — ASPIRIN 81 MG PO CHEW
81.0000 mg | CHEWABLE_TABLET | Freq: Every day | ORAL | Status: DC
  Administered 2012-05-29 – 2012-05-30 (×2): 81 mg via ORAL
  Filled 2012-05-29 (×2): qty 1

## 2012-05-29 NOTE — Progress Notes (Signed)
[facility] Progress Note [place] TEAM 1 - Stepdown/ICU TEAM   Cassidy [name] WUJ:[id number] DOB: 1965-10-13 DOA: 05/26/2012 PCP: Quitman Livings, MD  Brief narrative: 47 y.o. female with history of DM 2, hypertension, ongoing tobacco abuse who presented to the ER with chest pain. Chest pain was retrosternal pressure-like radiating to her neck. Pain increased on deep inspiration and without any associated shortness of breath fever chills or productive cough. In the ER patient's chest pain was relieved after IV nitroglycerin infusion was started. EKG and cardiac markers and chest x-ray were unremarkable.   Assessment/Plan:  Chest pain due to CAD D-Dimer + >> CT angio chest w/o evidence of PE, but did reveal signif coronary artery calcification of the LAD - Nuc Med stress test revealed 2 distinct areas of inducible ischemia - cardiac cath completed today, with results/cath report pending - Cardiology is following - pt is committed to lifestyle changes   Uncontrolled diabetes mellitus type 2 A1c 10.2 - CBG is slowly improving - I will adjust her tx regimen further today and follow - pt will need to d/c on insulin (was on oral meds alone prior to admit)   Recently drained right breast Abscess  No acute complicating issues   Hypertension  Well controlled at this time - no change in tx plan today  Tobacco abuse strongly advised patient to quit smoking - she is committed to do so  Hyperlipidemia LDL is >100 - initiated tx - LFTs normal this hospitalization - f/u LFTs in 8 weeks as outpt   Bipolar D/O Well compensated   Obesity  Code Status: FULL Family Communication: Discussed with patient and family at bedside Disposition Plan: SDU - possible d/c home in AM  Consultants: Cardiology - [facility]  Procedures: 5/16 - CT angio chest - no pulmonary embolus - prominent coronary artery calcification involving LAD 5/16 - nuc med stress - 2 foci of possible inducible ischemia involving  the apical to mid anterior and mid to basilar inferior walls  5/19 - cardiac cath - results pending   Antibiotics: none  DVT prophylaxis: lovenox  HPI/Subjective: Patient is having some low back pain, and pain at her cath insertion site.  She denies cp, sob, n/v, or abdom pain.    Objective: Blood pressure 105/64, pulse 84, temperature 98.1 F (36.7 C), temperature source Oral, resp. rate 16, height 5' 7.6" (1.717 m), weight 108.6 kg (239 lb 6.7 oz), last menstrual period 05/13/2012, SpO2 66.00%.  Intake/Output Summary (Last 24 hours) at 05/29/12 1359 Last data filed at 05/29/12 1300  Gross per 24 hour  Intake 664.12 ml  Output   1050 ml  Net -385.88 ml   Exam: General: No acute respiratory distress Lungs: Clear to auscultation bilaterally without wheezes or crackles Cardiovascular: Regular rate and rhythm without murmur Abdomen: Nontender, nondistended, soft, bowel sounds positive, no rebound, no ascites, no appreciable mass Extremities: No significant cyanosis, clubbing, or edema bilateral lower extremities  Data Reviewed: Basic Metabolic Panel:  Recent Labs Lab 05/26/12 0039 05/26/12 0505 05/29/12 0505  NA 133* 134* 135  K 3.8 4.2 4.3  CL 98 99 100  CO2 25 27 24   GLUCOSE 357* 302* 227*  BUN 12 11 9   CREATININE 0.57 0.57 0.66  CALCIUM 9.2 9.1 9.8   Liver Function Tests:  Recent Labs Lab 05/26/12 0505  AST 10  ALT 12  ALKPHOS 104  BILITOT 0.3  PROT 7.2  ALBUMIN 3.8   CBC:  Recent Labs Lab 05/26/12 0039 05/26/12 0505 05/28/12  2351 05/29/12 0505  WBC 13.3* 12.8* 11.0* 11.5*  NEUTROABS  --  7.7  --   --   HGB 13.4 13.6 13.2 14.6  HCT 37.2 38.3 36.9 41.7  MCV 82.1 83.4 82.9 83.2  PLT 296 308 282 327   Cardiac Enzymes:  Recent Labs Lab 05/26/12 0039 05/26/12 0505 05/26/12 0950 05/26/12 1547  TROPONINI <0.30 <0.30 <0.30 <0.30   CBG:  Recent Labs Lab 05/28/12 0743 05/28/12 1136 05/28/12 1635 05/28/12 2138 05/29/12 0824  GLUCAP  240* 280* 184* 236* 205*    Recent Results (from the past 240 hour(s))  MRSA PCR SCREENING     Status: None   Collection Time    05/26/12  4:27 AM      Result Value Range Status   MRSA by PCR NEGATIVE  NEGATIVE Final   Comment:            The GeneXpert MRSA Assay (FDA     approved for NASAL specimens     only), is one component of a     comprehensive MRSA colonization     surveillance program. It is not     intended to diagnose MRSA     infection nor to guide or     monitor treatment for     MRSA infections.     Studies:  Recent x-ray studies have been reviewed in detail by the Attending Physician  Scheduled Meds:  Scheduled Meds: . aspirin EC  325 mg Oral Daily  . buPROPion  150 mg Oral Daily  . carvedilol  3.125 mg Oral BID WC  . darifenacin  15 mg Oral Daily  . divalproex  500 mg Oral QHS  . enoxaparin (LOVENOX) injection  110 mg Subcutaneous Q12H  . gabapentin  300 mg Oral BID  . insulin aspart  0-20 Units Subcutaneous TID WC  . insulin aspart  0-5 Units Subcutaneous QHS  . insulin detemir  20 Units Subcutaneous BID  . PARoxetine  37.5 mg Oral Daily  . regadenoson  0.4 mg Intravenous Once  . senna-docusate  1 tablet Oral BID  . simvastatin  20 mg Oral q1800  . sodium chloride  3 mL Intravenous Q12H  . sodium chloride  3 mL Intravenous Q12H   Continuous Infusions: . sodium chloride Stopped (05/29/12 0404)  . sodium chloride 75 mL/hr at 05/29/12 0404  . bivalirudin (ANGIOMAX) infusion 5 mg/mL (Cath Lab,ACS,PCI indication) 0.25 mg/kg/hr (05/29/12 1152)  . nitroGLYCERIN 10 mcg/min (05/28/12 0045)    Time spent on care of this patient:   [facility] T  [facility] Office  [phone] Pager - Text Page per Loretha Stapler as per below:  On-Call/Text Page:      [email or web address]      password [email or web address]  If 7PM-7AM, please contact night-coverage [email or web address] Password [email or web address] 05/29/2012, 1:59 PM   LOS: 3 days

## 2012-05-29 NOTE — Progress Notes (Signed)
[facility]  Subjective: Pt had mild "indigestion last night" but denies chest pain and SOB.   Objective: Vital signs in last 24 hours: Temp:  [97.3 F (36.3 C)-98.8 F (37.1 C)] 98.8 F (37.1 C) (05/19 0353) Pulse Rate:  [75-85] 84 (05/18 1709) Resp:  [16-18] 16 (05/19 0000) BP: (104-130)/(63-79) 117/63 mmHg (05/19 0353) SpO2:  [92 %-98 %] 92 % (05/19 0353) Weight:  [239 lb 6.7 oz (108.6 kg)] 239 lb 6.7 oz (108.6 kg) (05/19 0440)    Intake/Output from previous day: 05/18 0701 - 05/19 0700 In: 697 [P.O.:240; I.V.:457] Out: 1375 [Urine:1375] Intake/Output this shift:    Medications Current Facility-Administered Medications  Medication Dose Route Frequency Provider Last Rate Last Dose  . 0.9 %  sodium chloride infusion   Intravenous Continuous Brittainy Simmons, PA-C      . 0.9 %  sodium chloride infusion  250 mL Intravenous PRN Brittainy Simmons, PA-C      . 0.9 %  sodium chloride infusion   Intravenous Continuous Brittainy Simmons, PA-C 75 mL/hr at 05/29/12 0404    . acetaminophen (TYLENOL) tablet 650 mg  650 mg Oral Q6H PRN Eduard Clos, MD   650 mg at 05/27/12 2227   Or  . acetaminophen (TYLENOL) suppository 650 mg  650 mg Rectal Q6H PRN Eduard Clos, MD      . albuterol (PROVENTIL HFA;VENTOLIN HFA) 108 (90 BASE) MCG/ACT inhaler 2 puff  2 puff Inhalation Q6H PRN Eduard Clos, MD      . alum & mag hydroxide-simeth (MAALOX/MYLANTA) 200-200-20 MG/5ML suspension 30 mL  30 mL Oral Q4H PRN Lonia Blood, MD      . aspirin EC tablet 325 mg  325 mg Oral Daily Eduard Clos, MD   325 mg at 05/28/12 1044  . buPROPion (WELLBUTRIN XL) 24 hr tablet 150 mg  150 mg Oral Daily Eduard Clos, MD   150 mg at 05/28/12 1044  . carvedilol (COREG) tablet 3.125 mg  3.125 mg Oral BID WC Lonia Blood, MD   3.125 mg at 05/28/12 1709  . darifenacin (ENABLEX) 24 hr tablet 15 mg  15 mg Oral Daily Eduard Clos, MD   15 mg at  05/28/12 1044  . divalproex (DEPAKOTE ER) 24 hr tablet 500 mg  500 mg Oral QHS Eduard Clos, MD   500 mg at 05/28/12 2210  . enoxaparin (LOVENOX) injection 110 mg  110 mg Subcutaneous Q12H Gwenlyn Found Carney, RPH   110 mg at 05/28/12 1710  . gabapentin (NEURONTIN) capsule 300 mg  300 mg Oral BID Eduard Clos, MD   300 mg at 05/28/12 2210  . insulin aspart (novoLOG) injection 0-20 Units  0-20 Units Subcutaneous TID WC Lonia Blood, MD   4 Units at 05/28/12 1743  . insulin aspart (novoLOG) injection 0-5 Units  0-5 Units Subcutaneous QHS Lonia Blood, MD   2 Units at 05/28/12 2209  . insulin detemir (LEVEMIR) injection 20 Units  20 Units Subcutaneous BID Lonia Blood, MD   20 Units at 05/28/12 2211  . morphine 2 MG/ML injection 1-2 mg  1-2 mg Intravenous Q3H PRN Lonia Blood, MD      . nitroGLYCERIN 0.2 mg/mL in dextrose 5 % infusion  5 mcg/min Intravenous Titrated Olivia Mackie, MD 3 mL/hr at 05/28/12 0045 10 mcg/min at 05/28/12 0045  . ondansetron (ZOFRAN) tablet 4 mg  4 mg Oral Q6H PRN Eduard Clos,  MD       Or  . ondansetron (ZOFRAN) injection 4 mg  4 mg Intravenous Q6H PRN Eduard Clos, MD      . oxyCODONE (Oxy IR/ROXICODONE) immediate release tablet 5-10 mg  5-10 mg Oral Q4H PRN Lonia Blood, MD   10 mg at 05/28/12 1507  . PARoxetine (PAXIL-CR) 24 hr tablet 37.5 mg  37.5 mg Oral Daily Eduard Clos, MD   37.5 mg at 05/28/12 1044  . regadenoson (LEXISCAN) injection SOLN 0.4 mg  0.4 mg Intravenous Once Brittainy Simmons, PA-C      . senna-docusate (Senokot-S) tablet 1 tablet  1 tablet Oral BID Robbie Lis, PA-C   1 tablet at 05/28/12 2217  . simvastatin (ZOCOR) tablet 20 mg  20 mg Oral q1800 Lonia Blood, MD   20 mg at 05/28/12 1743  . sodium chloride 0.9 % injection 3 mL  3 mL Intravenous Q12H Eduard Clos, MD   3 mL at 05/28/12 0830  . sodium chloride 0.9 % injection 3 mL  3 mL Intravenous Q12H Brittainy Simmons, PA-C       . sodium chloride 0.9 % injection 3 mL  3 mL Intravenous PRN Robbie Lis, PA-C        PE: General appearance: alert, cooperative and no distress Lungs: clear to auscultation bilaterally Heart: regular rate and rhythm Extremities: no LEE Pulses: 2+ and symmetric Skin: warm and dry Neurologic: Grossly normal  Lab Results:   Recent Labs  05/28/12 2351 05/29/12 0505  WBC 11.0* 11.5*  HGB 13.2 14.6  HCT 36.9 41.7  PLT 282 327   BMET  Recent Labs  05/29/12 0505  NA 135  K 4.3  CL 100  CO2 24  GLUCOSE 227*  BUN 9  CREATININE 0.66  CALCIUM 9.8   PT/INR  Recent Labs  05/29/12 0505  LABPROT 12.8  INR 0.97   Cholesterol  Recent Labs  05/28/12 0605  CHOL 176    Assessment/Plan  Principal Problem:   Unstable angina pectoris Active Problems:   DM   Abscess of right breast   Hypertension   Tobacco abuse   HLD (hyperlipidemia)   Family history of early CAD   Agatston coronary artery calcium score greater than 400   Abnormal nuclear stress test  Plan:  Plan for diagnostic LHC today with possible PCI. HR and BP stable. Renal function stable with SCr of 0.66. INR normal at 0.97. Pt has signed consent. No further questions. Continue to hold Metformin 48 hrs post cath.     LOS: 3 days    Brittainy M. Delmer Islam 05/29/2012 8:15 AM   Patient seen and examined. Agree with assessment and plan. No recurrent chest pain. Discussed cath and possible PCI with patient who agrees to proceed. Plan today. Will need smoking cessation and improved diabetic control.   Lennette Bihari, MD, [facility] 05/29/2012 9:07 AM

## 2012-05-29 NOTE — CV Procedure (Signed)
Cardiac Catheterization/PCI cutting balloon Angiosculpt of distal RCA bifurcation stenosis extending into PDA, and branch leading to PLA.  Cassidy [name], 47 y.o., female  Full note dictated; see diagram in chart  DICTATION #  [id number], [id number]  AO: 108/64 LV: 108/6  LM: normal LAD: proximal Ca++ witth 20% narrowing and 50% prox to mid stenosis LCX: 40 - 60% proximal stenosis extending into OM1 RCA: 20% prox, 30% mid, 95% distal RCA bifurcation stenosis extending into PDA and distal RCA/PLA branch  EF 60 - 65% with moderate LVH  Successful Angiosculpt cutting balloon of distal RCA, ostium of branch extending into the PLA vessel and PDA branch utilizing double wire technique with Prowater and Choice PT wires, 2.0 x 10 mm Angiosculpt to < 20% in distal RCA, and 0 in PDA and vessel to PLA.  Angiomax, 60 mg Effient, IC and IV NTG.  Lennette Bihari, MD, [facility] 05/29/2012 3:32 PM

## 2012-05-29 NOTE — Progress Notes (Signed)
Inpatient Diabetes Program Recommendations  AACE/ADA: New Consensus Statement on Inpatient Glycemic Control (2013)  Target Ranges:  Prepandial:   less than 140 mg/dL      Peak postprandial:   less than 180 mg/dL (1-2 hours)      Critically ill patients:  140 - 180 mg/dL   Reason for Assessment: Will require insulin at discharge.  Note:  Patient went for cardiac cath today.  Will visit tomorrow and show patient an insulin pen to see if she might prefer insulin via a pen.  Has Medicaid coverage.  Thank you.  Soni Kegel S. Elsie Lincoln, RN, CNS, CDE Inpatient Diabetes Program, team pager [phone]

## 2012-05-29 NOTE — Progress Notes (Signed)
Right femoral arterial sheath removed without difficulty. Pressure held 20 min. No hematoma, +3 pedal pulse. Dressing applied and patient instructed  in precautions.

## 2012-05-30 DIAGNOSIS — I25810 Atherosclerosis of coronary artery bypass graft(s) without angina pectoris: Secondary | ICD-10-CM

## 2012-05-30 DIAGNOSIS — E7800 Pure hypercholesterolemia, unspecified: Secondary | ICD-10-CM

## 2012-05-30 LAB — BASIC METABOLIC PANEL
BUN: 10 mg/dL (ref 6–23)
CO2: 20 mEq/L (ref 19–32)
Chloride: 102 mEq/L (ref 96–112)
Creatinine, Ser: 0.54 mg/dL (ref 0.50–1.10)
Glucose, Bld: 237 mg/dL — ABNORMAL HIGH (ref 70–99)
Potassium: 4 mEq/L (ref 3.5–5.1)

## 2012-05-30 LAB — GLUCOSE, CAPILLARY: Glucose-Capillary: 234 mg/dL — ABNORMAL HIGH (ref 70–99)

## 2012-05-30 LAB — CBC
HCT: 35.3 % — ABNORMAL LOW (ref 36.0–46.0)
Hemoglobin: 12.6 g/dL (ref 12.0–15.0)
MCHC: 35.7 g/dL (ref 30.0–36.0)
MCV: 82.7 fL (ref 78.0–100.0)
RDW: 13.1 % (ref 11.5–15.5)

## 2012-05-30 MED ORDER — INSULIN ASPART 100 UNIT/ML ~~LOC~~ SOLN: 8.0000 [IU] | Freq: Three times a day (TID) | SUBCUTANEOUS | Status: DC

## 2012-05-30 MED ORDER — INSULIN DETEMIR 100 UNIT/ML ~~LOC~~ SOLN: 30.0000 [IU] | Freq: Two times a day (BID) | SUBCUTANEOUS | Status: DC

## 2012-05-30 MED ORDER — MAGNESIUM HYDROXIDE 400 MG/5ML PO SUSP: 15.0000 mL | Freq: Every day | ORAL | Status: DC | PRN

## 2012-05-30 MED ORDER — SIMVASTATIN 20 MG PO TABS: 20.0000 mg | ORAL_TABLET | Freq: Every day | ORAL | Status: DC

## 2012-05-30 MED ORDER — PRASUGREL HCL 10 MG PO TABS: 10.0000 mg | ORAL_TABLET | Freq: Every day | ORAL | Status: DC

## 2012-05-30 MED ORDER — INSULIN PEN STARTER KIT
1.0000 | Freq: Once | Status: DC
  Filled 2012-05-30: qty 1

## 2012-05-30 MED FILL — Sodium Chloride IV Soln 0.9%: INTRAVENOUS | Qty: 50 | Status: AC

## 2012-05-30 NOTE — Progress Notes (Signed)
CARDIAC REHAB PHASE I   PRE:  Rate/Rhythm: 107 ST  BP:  Supine:   Sitting: 107/71  Standing:    SaO2:   MODE:  Ambulation: 740 ft   POST:  Rate/Rhythm: 117 ST  BP:  Supine:   Sitting: 103/81  Standing:    SaO2:  0940-1050 Pt tolerated ambulation well without c/o of cp or SOB. VS stable. Pt to side of bed after walk with call light in reach. Completed discharge education with pt. She voices understanding. Pt is very motivated to making life style changes. Discussed smoking cessation with pt. I gave her tips for quitting and coaching contact number. We discussed Outpt. CRP, she agrees to referral to [place] .  Melina Copa RN 05/30/2012 10:46 AM

## 2012-05-30 NOTE — Progress Notes (Signed)
[facility]  Subjective: No further chest pain. Mild groin tenderness but no pain.   Objective: Vital signs in last 24 hours: Temp:  [98.1 F (36.7 C)-98.7 F (37.1 C)] 98.2 F (36.8 C) (05/20 0401) Pulse Rate:  [70-85] 85 (05/20 0401) Resp:  [17-18] 18 (05/20 0401) BP: (92-125)/(57-86) 103/69 mmHg (05/20 0401) SpO2:  [66 %-100 %] 100 % (05/20 0401)    Intake/Output from previous day: 05/19 0701 - 05/20 0700 In: 1533.3 [I.V.:1533.3] Out: 1575 [Urine:1575] Intake/Output this shift:    Medications Current Facility-Administered Medications  Medication Dose Route Frequency Provider Last Rate Last Dose  . 0.9 %  sodium chloride infusion   Intravenous Continuous Lorrin Nawrot, PA-C      . 0.9 %  sodium chloride infusion   Intravenous Continuous Lennette Bihari, MD      . acetaminophen (TYLENOL) tablet 650 mg  650 mg Oral Q6H PRN Eduard Clos, MD   650 mg at 05/27/12 2227   Or  . acetaminophen (TYLENOL) suppository 650 mg  650 mg Rectal Q6H PRN Eduard Clos, MD      . acetaminophen (TYLENOL) tablet 650 mg  650 mg Oral Q4H PRN Lennette Bihari, MD      . albuterol (PROVENTIL HFA;VENTOLIN HFA) 108 (90 BASE) MCG/ACT inhaler 2 puff  2 puff Inhalation Q6H PRN Eduard Clos, MD      . alum & mag hydroxide-simeth (MAALOX/MYLANTA) 200-200-20 MG/5ML suspension 30 mL  30 mL Oral Q4H PRN Lonia Blood, MD      . aspirin chewable tablet 81 mg  81 mg Oral Daily Lennette Bihari, MD   81 mg at 05/29/12 1750  . buPROPion (WELLBUTRIN XL) 24 hr tablet 150 mg  150 mg Oral Daily Eduard Clos, MD   150 mg at 05/28/12 1044  . carvedilol (COREG) tablet 3.125 mg  3.125 mg Oral BID WC Lonia Blood, MD   3.125 mg at 05/29/12 1750  . darifenacin (ENABLEX) 24 hr tablet 15 mg  15 mg Oral Daily Eduard Clos, MD   15 mg at 05/28/12 1044  . divalproex (DEPAKOTE ER) 24 hr tablet 500 mg  500 mg Oral QHS Eduard Clos, MD   500 mg at  05/29/12 2139  . gabapentin (NEURONTIN) capsule 300 mg  300 mg Oral BID Eduard Clos, MD   300 mg at 05/29/12 2139  . insulin aspart (novoLOG) injection 0-20 Units  0-20 Units Subcutaneous TID WC Lonia Blood, MD      . insulin aspart (novoLOG) injection 0-5 Units  0-5 Units Subcutaneous QHS Lonia Blood, MD   3 Units at 05/29/12 2146  . insulin detemir (LEVEMIR) injection 28 Units  28 Units Subcutaneous BID Lonia Blood, MD   28 Units at 05/29/12 2139  . morphine 2 MG/ML injection 1-2 mg  1-2 mg Intravenous Q3H PRN Lonia Blood, MD   2 mg at 05/29/12 1847  . nitroGLYCERIN 0.2 mg/mL in dextrose 5 % infusion  5 mcg/min Intravenous Titrated Olivia Mackie, MD 3 mL/hr at 05/28/12 0045 10 mcg/min at 05/28/12 0045  . nitroGLYCERIN 0.2 mg/mL in dextrose 5 % infusion  2-200 mcg/min Intravenous Continuous Lennette Bihari, MD   6 mcg/min at 05/29/12 1734  . ondansetron (ZOFRAN) tablet 4 mg  4 mg Oral Q6H PRN Eduard Clos, MD       Or  . ondansetron [facility]) injection 4 mg  4  mg Intravenous Q6H PRN Eduard Clos, MD      . ondansetron [facility]) injection 4 mg  4 mg Intravenous Q6H PRN Lennette Bihari, MD      . oxyCODONE (Oxy IR/ROXICODONE) immediate release tablet 5-10 mg  5-10 mg Oral Q4H PRN Lonia Blood, MD   10 mg at 05/29/12 1426  . PARoxetine (PAXIL-CR) 24 hr tablet 37.5 mg  37.5 mg Oral Daily Eduard Clos, MD   37.5 mg at 05/28/12 1044  . prasugrel (EFFIENT) tablet 10 mg  10 mg Oral Daily Lennette Bihari, MD   10 mg at 05/29/12 1754  . regadenoson (LEXISCAN) injection SOLN 0.4 mg  0.4 mg Intravenous Once Joany Khatib, PA-C      . simvastatin (ZOCOR) tablet 20 mg  20 mg Oral q1800 Lonia Blood, MD   20 mg at 05/29/12 1750  . sodium chloride 0.9 % injection 3 mL  3 mL Intravenous Q12H Eduard Clos, MD   3 mL at 05/29/12 0800    PE: General appearance: alert, cooperative and no distress Lungs: clear to auscultation bilaterally Heart:  regular rate and rhythm Extremities: no LEE, right groin, no ecchymosis, no bleeding, mildly tender, no bruit Pulses: 2+ and symmetric Skin: warm and dry Neurologic: Grossly normal  Lab Results:   Recent Labs  05/28/12 2351 05/29/12 0505 05/30/12 0355  WBC 11.0* 11.5* 11.1*  HGB 13.2 14.6 12.6  HCT 36.9 41.7 35.3*  PLT 282 327 282   BMET  Recent Labs  05/29/12 0505 05/30/12 0355  NA 135 134*  K 4.3 4.0  CL 100 102  CO2 24 20  GLUCOSE 227* 237*  BUN 9 10  CREATININE 0.66 0.54  CALCIUM 9.8 9.0   PT/INR  Recent Labs  05/29/12 0505  LABPROT 12.8  INR 0.97   Cholesterol  Recent Labs  05/28/12 0605  CHOL 176   Cardiac Enzymes No components found with this basename: TROPONIN,  CKMB,   Studies/Results:  LHC 05/29/12 AO: 108/64  LV: 108/6  LM: normal  LAD: proximal Ca++ witth 20% narrowing and 50% prox to mid stenosis  LCX: 40 - 60% proximal stenosis extending into OM1  RCA: 20% prox, 30% mid, 95% distal RCA bifurcation stenosis extending into PDA and distal RCA/PLA branch  EF 60 - 65% with moderate LVH  Assessment/Plan  Principal Problem:   Unstable angina pectoris Active Problems:   DM   Abscess of right breast   Hypertension   Tobacco abuse   HLD (hyperlipidemia)   Family history of early CAD   Agatston coronary artery calcium score greater than 400   Abnormal nuclear stress test  Plan: S/P successful Angiosculpt cutting balloon of distal RCA, ostium of branch extending into the PLA vessel and PDA branch utilizing double wire technique with Prowater and Choice PT wires, 2.0 x 10 mm Angiosculpt to < 20% in distal RCA, and 0 in PDA and vessel to PLA. She denies further chest pain. Right groin is stable. HR and BP stable. Get out of bed and in chair today. Will get cardiac rehab to assess. Ambulate today. Plan for transfer to telemetry. Will give prn meds for constipation. Plan for possible discharge home later today or tomorrow.    LOS: 4 days     Maron Stanzione M. Delmer Islam 05/30/2012 7:47 AM

## 2012-05-30 NOTE — Cardiovascular Report (Signed)
NAMEBENNA, ARNO NO.:  [id number]  MEDICAL RECORD NO.:  [id number]  LOCATION:  2923                         FACILITY:  [facility]  PHYSICIAN:  Nicki Guadalajara, M.D.     DATE OF BIRTH:  11/12/1965  DATE OF PROCEDURE:  05/29/2012 DATE OF DISCHARGE:                           CARDIAC CATHETERIZATION   PROCEDURE:  Cardiac catheterization and percutaneous coronary intervention involving the distal right coronary artery, PDA, PLA bifurcation.  INDICATIONS:  [name]. Cassidy [name] is a 47 year old, obese African American female with longstanding history of tobacco use, history of poorly controlled diabetes mellitus, who had developed recurrent episodes of chest pain.  She was admitted with significant episode of chest pressure which was improved with nitroglycerin.  The nuclear perfusion study did suggest ischemia.  She had a positive D-dimer chest CT was negative for PE, although her coronary calcium score was significantly increased at 400.  She now presents for definitive cardiac catheterization and possible coronary intervention if necessary.  PROCEDURE:  After premedication with Versed 2 mg plus fentanyl 50 mcg, the patient was prepped and draped in usual fashion.  Right femoral artery was punctured anteriorly and a 5-French sheath was inserted without difficulty.  Diagnostic cardiac catheterization was done utilizing 5-French Judkins 4 left and right coronary catheters.  200 mcg intracoronary nitroglycerin was administered down the right coronary artery to further evaluate the distal RCA lesion extending into the PLA and PDA system.  A 5-French pigtail catheter was used for biplane cine left ventriculography.  With the patient's unstable angina symptomatology, positive nuclear perfusion scan and high-grade distal RCA disease, decision was made to attempt intervention.  A 5-French sheath was exchanged for 6-French sheath.  Angiomax bolus plus infusion was  administered.  With the patient's diabetic status, she received 60 mg of Effient for oral anti- platelet therapy.  A 6-French hockey-stick guide was used for the guiding catheter.  Initially, a Prowater wire was able to be advanced down beyond the 90-95% distal RCA stenosis just proximal to the bifurcation.  This extended into the ostium of the PDA but was also at least 95+ percent more involved in the ostium of the continuation branch of the RCA/PLA system.  The vessel was smaller than the distal vessels were small caliber.  Due to the bifurcation stenosis, it was felt that AngioSculpt for cutting balloon would be the modality that would be most beneficial in providing the intervention since it was felt perhaps to the bifurcation stenosis that this is not be stented.  A 2.0 x 10 mm AngioSculpt was then inserted over the Prowater wire which was advanced into the proximal portion of the PDA vessel.  Several inflations were made at this site up to 9 atmospheres.  The AngioSculpt was then removed back into the guide.  Attempts were then made with this, a Prowater wire to navigate into the more upward takeoff branch leading into the ultimate PLA system.  However, this was unsuccessful.  Consequently, the wire was then readvanced into the PDA system.  A 2nd double wire was then inserted which was a ChoICE PT light support.  A sharp angle was made on this ChoICE PT wire, and ultimately  this wire was able to selectively cannulate into the superior branch of the distal RCA extending towards the PLA system.  The endoscope balloon was then inserted over this wire and several inflations were made up to 10 atmospheres into the ostium of the distal RCA PLA system.  The AngioSculpt was then pulled back into the distal RCA proximal to the bifurcation and longer inflation at 10-11 atmospheres was made at this site.  Scout angiography confirmed a very good angiographic result. With the small caliber of  the vessels and the bifurcation stenosis, the decision was made not to stent this region.  During the procedure, ACT was documented to be therapeutic.  Scout angiography confirmed an excellent angiographic result.  The patient left the catheterization laboratory with stable hemodynamics chest pain free.  HEMODYNAMIC DATA:  Central aortic pressure 108/64.  Left ventricle pressure 108/6.  ANGIOGRAPHIC DATA:  Left main coronary artery was angiographically normal and bifurcated into the LAD and left circumflex system.  There was calcification in the proximal LAD with 20% narrowing before the first septal perforating artery.  The LAD after proximal bend had 50% stenosis.  The remainder of the LAD was free of significant disease.  The circumflex vessel had 20-30% proximal narrowing followed by 40-50% stenosis extending to at least 50-60% involving the origin of the obtuse marginal branch.  The right coronary artery had 20% proximal stenosis, 20-30% mid stenosis proximal to the acute margin.  The distal right coronary artery, just proximal to its bifurcation into the PDA and continuation branch extending into the PLA had 90-95% distal stenosis with at least 95% ostial narrowing in the continuation branch leading into the PLA system.  RAO ventriculography revealed an ejection fraction of 60-65% with moderate left ventricle hypertrophy without focal segmental wall motion abnormalities.  Following percutaneous coronary intervention done with Angiomax bolus plus infusion, 60 mg of Effient, intracoronary nitroglycerin as well as intravenous nitroglycerin using double wire technique with a Prowater wire being advanced down the PDA and ultimately the ChoICE PT wire into the continuation branch of the RCA extending into the PLA vessel.  All 3 sites were intervened upon with the AngioSculpt 2.0 x 10 mm balloon. The 95-99% ostial stenosis in the continuation branch/PLA was reduced to 0%.  The  ostium of the PDA was reduced to 0%.  The 95% distal right coronary artery proximal to the bifurcation was reduced to less than 20%.  There was brisk TIMI-3 flow.  There was no evidence for dissection.  IMPRESSION: 1. Normal LV function with moderate LVH and an ejection fraction of 60-     65%. 2. Evidence for coronary calcification with multivessel coronary     artery disease with 20% narrowing in the LAD proximally followed by     50% LAD stenosis; 20-30% proximal circumflex stenosis followed by     40-60% circumflex stenosis extending into the obtuse marginal     branch, and right coronary artery with 20% proximal narrowing, 20-     30% mid narrowing and bifurcation stenosis of 90-95%, involving mid     distal RCA extending into the ostium of the PDA and PLA.     Continuation RCA branch vessels. 3. Successful percutaneous coronary intervention.  Utilizing a     AngioSculpt cutting balloon with the 95% distal RCA stenosis being     reduced to less than 20%, the 95-99% ostial PLA stenosis being     reduced to 0% and PDA stenosis being reduced to 0%. 4. Bivalirudin/60  mg Effient/intracoronary and intravenous     nitroglycerin.         ______________________________ Nicki Guadalajara, M.D.    TK/MEDQ  D:  05/29/2012  T:  05/30/2012  Job:  [id number]

## 2012-05-30 NOTE — Progress Notes (Signed)
Inpatient Diabetes Program Recommendations  AACE/ADA: New Consensus Statement on Inpatient Glycemic Control (2013)  Target Ranges:  Prepandial:   less than 140 mg/dL      Peak postprandial:   less than 180 mg/dL (1-2 hours)      Critically ill patients:  140 - 180 mg/dL   Reason for Visit: Follow-up regarding patient being new to insulin  Note:  Patient is receptive to being discharged home on insulin.  Has been on Byetta in the past and is familiar with pens for medication delivery.  Wants to use an insulin pen after discharge.  Instructed patient regarding use of insulin pen by teach-back method.  Patient took the initiative to get an [facility] about a month prior to admission because "I knew I had to do better".  Her appointment is May 29th.  Had a glucose meter.   Patient has Medicaid.  Levemir vial or Lantus Solostar are on the Medicaid preferred medication list.  Request MD switch patient to Lantus for discharge and order the Lantus SoloStar pen and pen-needles.  Thank you.  Deirdre Gryder S. Elsie Lincoln, RN, CNS, CDE Inpatient Diabetes Program, team pager [phone]

## 2012-05-30 NOTE — Care Management Note (Signed)
    Page 1 of 1   05/30/2012     10:35:21 AM   CARE MANAGEMENT NOTE 05/30/2012  Patient:  PALESTINE, MOSCO   Account Number:  [id number]  Date Initiated:  05/30/2012  Documentation initiated by:  Junius Creamer  Subjective/Objective Assessment:   adm w angina     Action/Plan:   lives w fam, pcp dr Marcelline Deist hassan   Anticipated DC Date:     Anticipated DC Plan:        DC Planning Services  CM consult  Medication Assistance      Choice offered to / List presented to:             Status of service:   Medicare Important Message given?   (If response is "NO", the following Medicare IM given date fields will be blank) Date Medicare IM given:   Date Additional Medicare IM given:    Discharge Disposition:    Per UR Regulation:  Reviewed for med. necessity/level of care/duration of stay  If discussed at Long Length of Stay Meetings, dates discussed:    Comments:  5/20 1034a debbie Kimbly Eanes rn,bsn gave pt effient 30day free card. placed medicaid prior approval form on shadow chart. effient on nonpreferred list and needs prior auth from [place].

## 2012-05-30 NOTE — Progress Notes (Signed)
I have seen and evaluated the patient this AM along with Corine Shelter, PA. I agree with her findings, examination as well as impression recommendations.  Doing well post POBA of distal RCA bifurcation.  Vessels were not large enough for stent placement.  Diffuse CAD elsewhere -- needs aggressive RF modification.    Is on statin -- will need OP f/u.  BP only sufficient for low dose BB (would benefit from ACE-I-ARB in future as OP with Dx of DM as welll). [facility] is working on d/c plan for poorly controlled DM.  Needs Diet & exercise counseling -- will consult [facility] prior to d/c.  Need to see how she does with ambulation.  Otherwise, she is stable for d/c from Cardiology perspective.  Defer d/c timing to [facility] with RM Rx.   Marykay Lex, M.D., M.S. [facility] [address] [place], [place]  [address]  [phone] Pager # [phone] 05/30/2012 8:21 AM      Doing well post PCI

## 2012-05-30 NOTE — Discharge Summary (Signed)
Physician Discharge Summary  Cassidy [name] VHQ:[id number] DOB: 1965-07-01 DOA: 05/26/2012  PCP: Quitman Livings, MD  Admit date: 05/26/2012 Discharge date: 05/30/2012  Time spent: >45 minutes  Recommendations for Outpatient Follow-up:  1. LFTs in 2 months due to new start on Zocor  Discharge Diagnoses:  Principal Problem:   Unstable angina pectoris Active Problems:   DM- uncontrolled   Abscess of right breast   Hypertension   Tobacco abuse   HLD (hyperlipidemia)   Family history of early CAD   Agatston coronary artery calcium score greater than 400   Abnormal nuclear stress test   Discharge Condition: stable  Diet recommendation: heart healthy, diabetic  Filed Weights   05/26/12 0415 05/29/12 0440  Weight: 109 kg (240 lb 4.8 oz) 108.6 kg (239 lb 6.7 oz)    History of present illness:  47 y.o. female with history of DM 2, hypertension, ongoing tobacco abuse who presented to the ER with chest pain. Chest pain was retrosternal pressure-like radiating to her neck. Pain increased on deep inspiration and without any associated shortness of breath fever chills or productive cough. In the ER patient's chest pain was relieved after IV nitroglycerin infusion was started. EKG and cardiac markers and chest x-ray were unremarkable.    Hospital Course:  Chest pain due to CAD  D-Dimer + >> CT angio chest w/o evidence of PE, but did reveal signif coronary artery calcification of the LAD - [facility] consulted- Nuc Med stress test revealed 2 distinct areas of inducible ischemia - cardiac cath revealed 95% distal stenosis- s/p cutting balloon- now on Effient- cardiac rehab ordered - pt is committed to lifestyle changes   Uncontrolled diabetes mellitus type 2  A1c 10.2 - will be discharged on Lantus and Novolog- I expect she will need further titration of both -  teaching given- will follow a outpt for further teaching in regards to nurtrition  Recently drained right breast Abscess  No acute  complicating issues   Hypertension  Well controlled at this time -  Tobacco abuse  strongly advised patient to quit smoking - she is committed to do so   Hyperlipidemia  LDL is >100 - initiated tx with Zocor - LFTs normal this hospitalization - f/u LFTs in 8 weeks as outpt   Bipolar D/O  Well compensated      Procedures: 5/19- Cardiac cath- angiosculpt cutting balloon of distal RCA-LAD: proximal Ca++ witth 20% narrowing and 50% prox to mid stenosis  LCX: 40 - 60% proximal stenosis extending into OM1  Consultations:  [facility]  Discharge Exam: Filed Vitals:   05/30/12 0401 05/30/12 0843 05/30/12 0916 05/30/12 1232  BP: 103/69 97/56 107/71 92/64  Pulse: 85  97   Temp: 98.2 F (36.8 C) 98.6 F (37 C)  98.4 F (36.9 C)  TempSrc: Oral Oral  Oral  Resp: 18 18    Height:      Weight:      SpO2: 100% 100%  100%    General: AAO x 3, no distress Cardiovascular: RRR, no murmurs Respiratory: CTA b/l   Discharge Instructions  Discharge Orders   Future Appointments Provider Department Dept Phone   06/06/2012 2:10 PM Wilmon Arms. Corliss Skains, MD [facility], [place] [phone]   06/08/2012 10:30 AM Vevelyn Royals, [place] [facility] [phone]   06/26/2012 3:00 PM Brock Bad, MD [facility] [phone]   Future Orders Complete By Expires     Amb Referral to Cardiac  Rehabilitation  As directed     Diet - low sodium heart healthy  As directed     Comments:      Diabetic diet    Increase activity slowly  As directed         Medication List    STOP taking these medications       ibuprofen 800 MG tablet  Commonly known as:  ADVIL,MOTRIN      TAKE these medications       albuterol 108 (90 BASE) MCG/ACT inhaler  Commonly known as:  PROVENTIL HFA;VENTOLIN HFA  Inhale 2 puffs into the lungs every 6 (six) hours as needed for wheezing.     buPROPion 150 MG 24 hr tablet  Commonly known as:   WELLBUTRIN XL  Take 150 mg by mouth daily.     divalproex 500 MG 24 hr tablet  Commonly known as:  DEPAKOTE ER  Take 500 mg by mouth at bedtime.     ENABLEX 15 MG 24 hr tablet  Generic drug:  darifenacin  Take 15 mg by mouth daily.     gabapentin 300 MG capsule  Commonly known as:  NEURONTIN  Take 300 mg by mouth 2 (two) times daily.     insulin aspart 100 UNIT/ML injection  Commonly known as:  NOVOLOG  Inject 8 Units into the skin 3 (three) times daily before meals.     insulin detemir 100 UNIT/ML injection  Commonly known as:  LEVEMIR  Inject 0.3 mLs (30 Units total) into the skin 2 (two) times daily.     lisinopril 10 MG tablet  Commonly known as:  PRINIVIL,ZESTRIL  Take 10 mg by mouth daily.     metFORMIN 500 MG tablet  Commonly known as:  GLUCOPHAGE  Take 500 mg by mouth 2 (two) times daily with a meal.     oxyCODONE-acetaminophen 5-325 MG per tablet  Commonly known as:  PERCOCET/ROXICET  Take 1 tablet by mouth every 4 (four) hours as needed for pain.     PARoxetine 37.5 MG 24 hr tablet  Commonly known as:  PAXIL-CR  Take 37.5 mg by mouth every morning.     prasugrel 10 MG Tabs  Commonly known as:  EFFIENT  Take 1 tablet (10 mg total) by mouth daily.     simvastatin 20 MG tablet  Commonly known as:  ZOCOR  Take 1 tablet (20 mg total) by mouth daily at 6 PM.       No Known Allergies     Follow-up Information   Follow up with [facility], MD. Schedule an appointment as soon as possible for a visit in 1 week.   Contact information:   [address] [facility] [address] [facility] [address] [place] [address] [phone]       Follow up with Marykay Lex, MD. (As needed)    Contact information:   [address] [place] [address] [phone]        The results of significant diagnostics from this hospitalization (including imaging, microbiology, ancillary and laboratory) are listed below for reference.    Significant Diagnostic Studies: Ct Angio  Chest Pe W/cm &/or Wo Cm  05/26/2012   *RADIOLOGY REPORT*  Clinical Data: Chest pain and a positive D-dimer  CT ANGIOGRAPHY CHEST  Technique:  Multidetector CT imaging of the chest using the standard protocol during bolus administration of intravenous contrast. Multiplanar reconstructed images including MIPs were obtained and reviewed to evaluate the vascular anatomy.  Contrast: OMNIPAQUE IOHEXOL 350 MG/ML  SOLN  Comparison: 01/23/2009  Findings: Lungs/pleura: No pleural effusion identified.  There is no airspace consolidation identified.  Plate-like atelectasis is identified in both lung bases.  4 mm subpleural nodules identified in the left upper lobe, image 37/series 5.  This is stable from previous exam and likely benign.  4 mm parenchymal nodule in the left upper lobe is also stable from previous exam and likely benign.  There is no airspace consolidation identified.  Heart/Mediastinum: Heart size is normal.  There is no pericardial effusion identified.  There is no enlarged mediastinal or hilar lymph nodes.  The main pulmonary artery is patent.  No lumbar or segmental pulmonary artery filling defects identified to suggest pulmonary embolus. Prominent coronary artery calcifications involve the LAD.  Upper abdomen: There are no acute findings identified within the upper abdomen.  Bones/Musculoskeletal:  There are prominent axillary lymph nodes identified bilaterally.   Review of the visualized bony structures is unremarkable.  IMPRESSION:  1.  No acute pulmonary embolus. 2.  Small pulmonary nodules are stable from previous exam and likely benign. 3.  Prominent coronary artery calcifications involving the LAD.   Original Report Authenticated By: Signa Kell, M.D.   Nm Myocar Multi W/spect W/wall Motion / Ef  05/27/2012   *RADIOLOGY REPORT*  Clinical Data:  Chest pain.  MYOCARDIAL IMAGING WITH SPECT (REST AND PHARMACOLOGIC-STRESS) GATED LEFT VENTRICULAR WALL MOTION STUDY LEFT VENTRICULAR EJECTION  FRACTION  Standard myocardial SPECT imaging was performed after resting intravenous injection of Tc-64m tetrofosmin.  Subsequently, intravenous infusion of Lexiscan  was performed under the supervision of cardiology staff.  At peak effect of the drug, of Tc-87m tetrofosmin was injected intravenously and standard myocardial SPECT imaging was performed.  Quantitative gated imaging was also performed to evaluate left ventricular wall motion and estimated left ventricular ejection fraction.  Comparison:  CT of 05/26/2012  Findings:  Rest images demonstrate a suggestion of apical segment anteroseptal fixed defect.  This could represent soft tissue attenuation or scar from prior infarct.  Stress images demonstrate 2 subtle areas of possible reversibility. The first is in the apical to mid segment of the anterior wall. The second is in the mid to basilar segment of the inferior wall.  Evaluation of wall motion demonstrates normal left ventricular wall motion and thickening.  Ejection fraction is estimated at 53%.  End diastolic volume of 96 cc.  End systolic volume of  45 cc.  IMPRESSION:  1.  2 foci of possible inducible ischemia involving the apical to mid anterior and mid to basilar inferior walls.  Especially given the extent of markedly age advanced coronary artery atherosclerosis on prior CT, inducible ischemia cannot be excluded. 2.  Soft tissue attenuation artifact versus remote apical segment anteroseptal infarct.  These results will be called to the ordering clinician or representative by the Radiologist Assistant, and communication documented in the PACS Dashboard.   Original Report Authenticated By: Jeronimo Greaves, M.D.   Dg Chest Port 1 View  05/26/2012   *RADIOLOGY REPORT*  Clinical Data: Chest pain, shortness of breath.  PORTABLE CHEST - 1 VIEW  Comparison: 09/16/2011  Findings: Mild peribronchial thickening.  No confluent opacities. Heart is normal size.  No effusions or acute bony abnormality.   IMPRESSION: Mild bronchitic changes.   Original Report Authenticated By: Charlett Nose, M.D.    Microbiology: Recent Results (from the past 240 hour(s))  MRSA PCR SCREENING     Status: None   Collection Time    05/26/12  4:27 AM  Result Value Range Status   MRSA by PCR NEGATIVE  NEGATIVE Final   Comment:            The GeneXpert MRSA Assay (FDA     approved for NASAL specimens     only), is one component of a     comprehensive MRSA colonization     surveillance program. It is not     intended to diagnose MRSA     infection nor to guide or     monitor treatment for     MRSA infections.     Labs: Basic Metabolic Panel:  Recent Labs Lab 05/26/12 0039 05/26/12 0505 05/29/12 0505 05/30/12 0355  NA 133* 134* 135 134*  K 3.8 4.2 4.3 4.0  CL 98 99 100 102  CO2 25 27 24 20   GLUCOSE 357* 302* 227* 237*  BUN 12 11 9 10   CREATININE 0.57 0.57 0.66 0.54  CALCIUM 9.2 9.1 9.8 9.0   Liver Function Tests:  Recent Labs Lab 05/26/12 0505  AST 10  ALT 12  ALKPHOS 104  BILITOT 0.3  PROT 7.2  ALBUMIN 3.8   No results found for this basename: LIPASE, AMYLASE,  in the last 168 hours No results found for this basename: AMMONIA,  in the last 168 hours CBC:  Recent Labs Lab 05/26/12 0039 05/26/12 0505 05/28/12 2351 05/29/12 0505 05/30/12 0355  WBC 13.3* 12.8* 11.0* 11.5* 11.1*  NEUTROABS  --  7.7  --   --   --   HGB 13.4 13.6 13.2 14.6 12.6  HCT 37.2 38.3 36.9 41.7 35.3*  MCV 82.1 83.4 82.9 83.2 82.7  PLT 296 308 282 327 282   Cardiac Enzymes:  Recent Labs Lab 05/26/12 0039 05/26/12 0505 05/26/12 0950 05/26/12 1547  TROPONINI <0.30 <0.30 <0.30 <0.30   BNP: BNP (last 3 results) No results found for this basename: PROBNP,  in the last 8760 hours CBG:  Recent Labs Lab 05/29/12 0824 05/29/12 1649 05/29/12 2139 05/30/12 0844 05/30/12 1234  GLUCAP 205* 140* 251* 187* 234*       Signed:  Gared Gillie  [facility] 05/30/2012, 5:40 PM

## 2012-06-06 ENCOUNTER — Ambulatory Visit (INDEPENDENT_AMBULATORY_CARE_PROVIDER_SITE_OTHER): Payer: Medicaid Other | Admitting: Surgery

## 2012-06-06 ENCOUNTER — Encounter (INDEPENDENT_AMBULATORY_CARE_PROVIDER_SITE_OTHER): Payer: Self-pay | Admitting: Surgery

## 2012-06-06 VITALS — BP 128/72 | HR 66 | Temp 97.3°F | Resp 16 | Ht 67.5 in | Wt 241.2 lb

## 2012-06-06 DIAGNOSIS — N61 Mastitis without abscess: Secondary | ICD-10-CM

## 2012-06-06 DIAGNOSIS — N611 Abscess of the breast and nipple: Secondary | ICD-10-CM

## 2012-06-06 NOTE — Progress Notes (Signed)
The patient returns for recheck of the right breast abscess. The wound is almost completely healed. It is clean with minimal drainage. No surrounding cellulitis. Nontender.  Unfortunately after her last visit the patient suffered an MI. She had angioplasty and is now on Effient and Zocor.  She has a hematoma at her right groin. She is scheduled to go back to see her cardiologist about this hematoma.  We will see her back when necessary about her right breast abscess. In the meantime until this completely heals she should treat this area with Neosporin.  Wilmon Arms. Corliss Skains, MD, [facility]  General/ Trauma Surgery  06/06/2012 3:41 PM

## 2012-06-08 ENCOUNTER — Ambulatory Visit: Payer: Medicaid Other | Admitting: *Deleted

## 2012-06-09 ENCOUNTER — Encounter: Payer: Self-pay | Admitting: Cardiology

## 2012-06-09 ENCOUNTER — Ambulatory Visit (INDEPENDENT_AMBULATORY_CARE_PROVIDER_SITE_OTHER): Payer: Medicaid Other | Admitting: Cardiology

## 2012-06-09 VITALS — BP 120/70 | HR 97 | Ht 67.5 in | Wt 244.0 lb

## 2012-06-09 DIAGNOSIS — R109 Unspecified abdominal pain: Secondary | ICD-10-CM

## 2012-06-09 DIAGNOSIS — M79609 Pain in unspecified limb: Secondary | ICD-10-CM

## 2012-06-09 DIAGNOSIS — E119 Type 2 diabetes mellitus without complications: Secondary | ICD-10-CM

## 2012-06-09 DIAGNOSIS — F17200 Nicotine dependence, unspecified, uncomplicated: Secondary | ICD-10-CM

## 2012-06-09 DIAGNOSIS — Z720 Tobacco use: Secondary | ICD-10-CM

## 2012-06-09 DIAGNOSIS — N63 Unspecified lump in unspecified breast: Secondary | ICD-10-CM

## 2012-06-09 DIAGNOSIS — I2510 Atherosclerotic heart disease of native coronary artery without angina pectoris: Secondary | ICD-10-CM

## 2012-06-09 DIAGNOSIS — E7800 Pure hypercholesterolemia, unspecified: Secondary | ICD-10-CM

## 2012-06-09 DIAGNOSIS — F3132 Bipolar disorder, current episode depressed, moderate: Secondary | ICD-10-CM

## 2012-06-09 DIAGNOSIS — R1030 Lower abdominal pain, unspecified: Secondary | ICD-10-CM

## 2012-06-09 MED ORDER — CARVEDILOL 3.125 MG PO TABS: 3.1250 mg | ORAL_TABLET | Freq: Two times a day (BID) | ORAL | Status: DC

## 2012-06-09 MED ORDER — INSULIN ASPART 100 UNIT/ML ~~LOC~~ SOLN: 8.0000 [IU] | Freq: Three times a day (TID) | SUBCUTANEOUS | Status: DC

## 2012-06-09 MED ORDER — CLOPIDOGREL BISULFATE 75 MG PO TABS: 75.0000 mg | ORAL_TABLET | Freq: Every day | ORAL | Status: DC

## 2012-06-09 NOTE — Assessment & Plan Note (Signed)
Stable

## 2012-06-09 NOTE — Assessment & Plan Note (Signed)
Refilled her novolog at her request until she has new PCP.

## 2012-06-09 NOTE — Progress Notes (Signed)
[facility]  06/09/2012   PCP: Quitman Livings, MD   Chief Complaint  Patient presents with  . Post Hospital    Catheterization, site is very tender, bruised-getting worse, wakes her up at night.    Primary Cardiologist: Dr. Tresa Endo  HPI: 47 year old African American female presents today for followup after hospitalization for unstable angina. She underwent cardiac catheterization and cutting balloon angioscoped : LM: normal  LAD: proximal Ca++ witth 20% narrowing and 50% prox to mid stenosis  LCX: 40 - 60% proximal stenosis extending into OM1  RCA: 20% prox, 30% mid, 95% distal RCA bifurcation stenosis extending into PDA and distal RCA/PLA branch  EF 60 - 65% with moderate LVH  Successful Angiosculpt cutting balloon of distal RCA, ostium of branch extending into the PLA vessel and PDA branch utilizing double wire technique with Prowater and Choice PT wires, 2.0 x 10 mm Angiosculpt to < 20% in distal RCA, and 0 in PDA and vessel to PLA.   Other history includes diabetes mellitus type 2 insulin-dependent hypertension ongoing tobacco abuse. When she presented her pain was retrosternal pressure-like discomfort radiating to her neck.  The pain would increase with deep inspiration she had no associated shortness of breath nausea fever or chills.  Her pain improved after IV nitroglycerin EKG was without acute changes and her cardiac markers are negative.  Patient also carries diagnoses of bipolar disorder.  She's had a sleep study and there were no indications at that time for CPAP therapy.    Today she has complained of right thigh pain after cardiac catheterization with ecchymosis, medication adjustments, need for refills of medications and issues with current medications.   Medicaid is not pay Effient, though she does have a 30 day supply for now.  She would like another medication.  Her PCP is leaving and she request we fill her Novolog insulin until she has new MD  in July.  Also in review of meds she is not on BB, though she was on coreg in hospital.   No chest pain, no SOB.  Only Rt. Thigh/groin pain.  She has been given Percocet from PCP for a breast abscess and  This is helping her leg pain.      No Known Allergies  Current Outpatient Prescriptions  Medication Sig Dispense Refill  . acyclovir (ZOVIRAX) 200 MG capsule Take by mouth 2 (two) times daily.      Marland Kitchen albuterol (PROVENTIL HFA;VENTOLIN HFA) 108 (90 BASE) MCG/ACT inhaler Inhale 2 puffs into the lungs every 6 (six) hours as needed for wheezing.      Marland Kitchen aspirin 81 MG chewable tablet Chew 162 mg by mouth daily.      Marland Kitchen buPROPion (WELLBUTRIN XL) 150 MG 24 hr tablet Take 150 mg by mouth daily.      . cetirizine (ZYRTEC) 10 MG tablet Take 10 mg by mouth daily.      . Cyanocobalamin (B-12 PO) Take by mouth daily.      Marland Kitchen darifenacin (ENABLEX) 15 MG 24 hr tablet Take 15 mg by mouth daily.      . divalproex (DEPAKOTE ER) 500 MG 24 hr tablet Take 500 mg by mouth at bedtime.       . fluticasone (VERAMYST) 27.5 MCG/SPRAY nasal spray Place 2 sprays into the nose daily.      Marland Kitchen gabapentin (NEURONTIN) 300 MG capsule Take 300 mg by mouth 2 (two) times daily.      . insulin aspart (NOVOLOG) 100 UNIT/ML injection  Inject 8 Units into the skin 3 (three) times daily before meals.  1 vial  1  . insulin detemir (LEVEMIR) 100 UNIT/ML injection Inject 0.3 mLs (30 Units total) into the skin 2 (two) times daily.  10 mL  12  . lisinopril (PRINIVIL,ZESTRIL) 10 MG tablet Take 10 mg by mouth daily.      . metFORMIN (GLUCOPHAGE) 500 MG tablet Take 500 mg by mouth 2 (two) times daily with a meal.        . naproxen sodium (ANAPROX) 550 MG tablet Take 550 mg by mouth as needed.      Marland Kitchen omeprazole (PRILOSEC) 20 MG capsule Take 20 mg by mouth daily.      Marland Kitchen oxyCODONE-acetaminophen (PERCOCET/ROXICET) 5-325 MG per tablet Take 1 tablet by mouth every 4 (four) hours as needed for pain.  40 tablet  0  . PARoxetine (PAXIL-CR) 37.5 MG 24 hr  tablet Take 37.5 mg by mouth as needed.       . pentosan polysulfate (ELMIRON) 100 MG capsule Take 2 capsules in the AM and 1 capsule in the PM.      . prasugrel (EFFIENT) 10 MG TABS Take 1 tablet (10 mg total) by mouth daily.  30 tablet  0  . simvastatin (ZOCOR) 20 MG tablet Take 1 tablet (20 mg total) by mouth daily at 6 PM.  30 tablet  0  . carvedilol (COREG) 3.125 MG tablet Take 1 tablet (3.125 mg total) by mouth 2 (two) times daily.  60 tablet  6  . clopidogrel (PLAVIX) 75 MG tablet Take 1 tablet (75 mg total) by mouth daily.  30 tablet  6   No current facility-administered medications for this visit.    Past Medical History  Diagnosis Date  . Hypertension   . GERD (gastroesophageal reflux disease)   . Breast abscess     recurrent; on right  . High cholesterol   . Type II diabetes mellitus   . Anemia   . Sickle cell trait   . Fibromyalgia   . Genital warts   . Constipation   . Diabetic peripheral neuropathy   . Depression   . Bipolar depression   . PTSD (post-traumatic stress disorder)   . Complication of anesthesia     deep pain in legs and arms for 3 days post anesthesia   . Asthma     seasonal  . Tooth caries     pt. states she will have an extraction in 5/14 on tooth on bottom right  . Pain in limb     LEA VENOUS DUPLEX, 02/05/2009 - no evidence of deep vein thrombosis, Baker's cyst  . Chest pain     2D ECHO, 02/05/2009 - EF >55%,   . CAD (coronary artery disease) 05/2012    angiosculpt cutting balloon of distal RCA and ostium of branch extending into the PLA vessel and PDA branch. residual disease of LAD and LCX non obstructive, EF 60-65%    Past Surgical History  Procedure Laterality Date  . Bladder surgery  1980    "TVT"  . Irrigation and debridement abscess  12/19/2010    Procedure: IRRIGATION AND DEBRIDEMENT ABSCESS;  Surgeon: Emelia Loron, MD;  Location: [facility] OR;  Service: General;  Laterality: Right;  . Incise and drain abcess  ; 04/26/11; 08/13/11    right  breast  . Tonsillectomy  1988  . Refractive surgery  ~ 2010    right  . Irrigation and debridement abscess  08/13/2011  Procedure: MINOR INCISION AND DRAINAGE OF ABSCESS;  Surgeon: Cherylynn Ridges, MD;  Location: [facility] OR;  Service: General;  Laterality: Right;  Right Breast   . Irrigation and debridement abscess  11/17/2011    Procedure: IRRIGATION AND DEBRIDEMENT ABSCESS;  Surgeon: Wilmon Arms. Corliss Skains, MD;  Location: [facility] OR;  Service: General;  Laterality: Right;  irrigation and debridement right recurrent breast abscess  . Breast surgery Right     I&D for multiple abscesses  . Eye surgery Left     laser surgery  . Larynx surgery    . Ployp removed from voice box 03/30/12    . Incision and drainage abscess Right 05/09/2012    Procedure: INCISION AND DRAINAGE RIGHT BREAST ABSCESS;  Surgeon: Wilmon Arms. Corliss Skains, MD;  Location: [facility] OR;  Service: General;  Laterality: Right;  . Nm myoview ltd  01/26/2009    Normal study, no evidence of ischemia, EF 67%  . Cutting balloon    . Cardiac catheterization  05/2012    see medical Hx.    ZOX:WRUEAVW:UJ colds or fevers, no weight changes Skin:no rashes or ulcers, though injection sites of insulin are itching HEENT:no blurred vision, no congestion CV:see HPI PUL:see HPI GI:no diarrhea constipation or melena, no indigestion GU:no hematuria, no dysuria MS:no joint pain, no claudication, see HPI Neuro:no syncope, no lightheadedness Endo:+ diabetes- glucose 145, no thyroid disease  PHYSICAL EXAM BP 120/70  Pulse 97  Ht 5' 7.5" (1.715 m)  Wt 244 lb (110.678 kg)  BMI 37.63 kg/m2  LMP 05/13/2012 General:Pleasant affect, NAD Skin:Warm and dry, brisk capillary refill HEENT:normocephalic, sclera clear, mucus membranes moist Neck:supple, no JVD, no bruits  Heart:S1S2 RRR without murmur, gallup, rub or click Lungs:clear without rales, rhonchi, or wheezes WJX:BJYN, non tender,obese, + BS, do not palpate liver spleen or masses Ext:no lower ext edema, 2+ pedal  pulses, 2+ radial pulses, rt thigh with ecchymosis, do not hear femoral bruit, very tender to touch Neuro:alert and oriented, MAE, follows commands, + facial symmetry  EKG: Sinus rhythm without any acute changes from tracing in the hospital heart rate is 97 and taking her pulse at times up to 90  ASSESSMENT AND PLAN CAD (coronary artery disease) No chest pain, no SOB.  We have changed her Effient when completed after 30 days to Plavix, unable to afford the Effient. Also added Coreg 3.125 mg twice a day secondary to heart rate of 99 today and coronary artery disease.  EKG was stable without complications   LEG PAIN Rt. Thigh pain with bruising, and at groin site.  Will check arterial doppler to rule out pseudoaneurysm.  She has percocet if needed for pain.  Instructed it may take several weeks to resolve.   DM Refilled her novolog at her request until she has new PCP.  BIPOLAR I D/O MOST RECENT EPIS DEPRESSED MOD Stable   BREAST MASS, RIGHT Followed by PCP  HYPERCHOLESTEROLEMIA Stable   Tobacco abuse Stopped tobacco in March of this year.   Patient will follow with Dr. Tresa Endo in 6 weeks. She has any problems prior to that time she'll call us we will call her the results of the Doppler study.

## 2012-06-09 NOTE — Assessment & Plan Note (Signed)
Stopped tobacco in March of this year.

## 2012-06-09 NOTE — Assessment & Plan Note (Signed)
No chest pain, no SOB.  We have changed her Effient when completed after 30 days to Plavix, unable to afford the Effient. Also added Coreg 3.125 mg twice a day secondary to heart rate of 99 today and coronary artery disease.  EKG was stable without complications

## 2012-06-09 NOTE — Patient Instructions (Addendum)
  Your physician wants you to follow-up with him in : 6 weeks with Dr Tresa Endo                                                Your physician has recommended you make the following change in your medication: start plavix (clopidogrel) after you have taken the effient for 1 month after your procedure.  Start carvedilol 3.125mg  twice a day.     Your physician has ordered the following tests: ultrasound of your right groin  We have placed the order for cardiac rehab.  They will contact you to set up your rehab.

## 2012-06-09 NOTE — Assessment & Plan Note (Signed)
Rt. Thigh pain with bruising, and at groin site.  Will check arterial doppler to rule out pseudoaneurysm.  She has percocet if needed for pain.  Instructed it may take several weeks to resolve.

## 2012-06-09 NOTE — Assessment & Plan Note (Signed)
Followed by PCP

## 2012-06-10 ENCOUNTER — Telehealth (HOSPITAL_COMMUNITY): Payer: Self-pay | Admitting: Cardiovascular Disease

## 2012-06-22 ENCOUNTER — Encounter (HOSPITAL_COMMUNITY)
Admission: RE | Admit: 2012-06-22 | Discharge: 2012-06-22 | Disposition: A | Discharge status: Home / Self Care | Payer: Medicaid Other | Source: Ambulatory Visit | Attending: Cardiology | Admitting: Cardiology

## 2012-06-22 DIAGNOSIS — I2510 Atherosclerotic heart disease of native coronary artery without angina pectoris: Secondary | ICD-10-CM | POA: Insufficient documentation

## 2012-06-22 DIAGNOSIS — I10 Essential (primary) hypertension: Secondary | ICD-10-CM | POA: Insufficient documentation

## 2012-06-22 DIAGNOSIS — Z951 Presence of aortocoronary bypass graft: Secondary | ICD-10-CM | POA: Insufficient documentation

## 2012-06-22 DIAGNOSIS — Z5189 Encounter for other specified aftercare: Secondary | ICD-10-CM | POA: Insufficient documentation

## 2012-06-22 NOTE — Progress Notes (Signed)
Cardiac Rehab Medication Review by a Pharmacist  Does the patient  feel that his/her medications are working for him/her?  yes  Has the patient been experiencing any side effects to the medications prescribed?  Yes, itching with insulin injections per patient  Does the patient measure his/her own blood pressure or blood glucose at home?  No, doesn't have cuff  Does the patient have any problems obtaining medications due to transportation or finances?   Yes, having to change from Effient to Plavix  Understanding of regimen: good Understanding of indications: good Potential of compliance: good  Pharmacist comments: Patients medication list was reviewed for accuracy, indications, adverse effects, and compliance. Any questions were addressed at this time. Patient with knot on back of leg, to address with cardiac rehab RN, encouraged patient to call cardiologist today.   Abran Duke, PharmD Clinical Pharmacist Phone: [phone] Pager: [phone] 06/22/2012 8:29 AM

## 2012-06-23 ENCOUNTER — Encounter (HOSPITAL_COMMUNITY): Payer: Medicaid Other

## 2012-06-26 ENCOUNTER — Ambulatory Visit: Payer: Medicaid Other | Admitting: Obstetrics

## 2012-06-27 ENCOUNTER — Encounter (INDEPENDENT_AMBULATORY_CARE_PROVIDER_SITE_OTHER): Payer: Medicaid Other | Admitting: Surgery

## 2012-06-29 ENCOUNTER — Encounter: Payer: Medicaid Other | Attending: Internal Medicine | Admitting: *Deleted

## 2012-06-29 ENCOUNTER — Encounter: Payer: Self-pay | Admitting: *Deleted

## 2012-06-29 VITALS — Ht 67.0 in | Wt 243.3 lb

## 2012-06-29 DIAGNOSIS — Z713 Dietary counseling and surveillance: Secondary | ICD-10-CM | POA: Insufficient documentation

## 2012-06-29 DIAGNOSIS — E119 Type 2 diabetes mellitus without complications: Secondary | ICD-10-CM

## 2012-06-29 NOTE — Patient Instructions (Addendum)
Goals:  Follow Diabetes Meal Plan as instructed  Eat 3 meals and 2 snacks, every 3-5 hrs  Limit carbohydrate intake to 30-45 grams carbohydrate/meal  Limit carbohydrate intake to 15 grams carbohydrate/snack  Add lean protein foods to meals/snacks  Monitor glucose levels as instructed by your doctor  Aim for 30 mins of physical activity daily  Bring food record and glucose log to your next nutrition visit 

## 2012-06-29 NOTE — Progress Notes (Signed)
  Patient was seen on 06/29/2012 for the first of a series of three diabetes self-management courses at the [facility]. The following learning objectives were met by the patient during this course:   Defines the role of glucose and insulin  Identifies type of diabetes and pathophysiology  Defines the diagnostic criteria for diabetes and prediabetes  States the risk factors for Type 2 Diabetes  States the symptoms of Type 2 Diabetes  Defines Type 2 Diabetes treatment goals  Defines Type 2 Diabetes treatment options  States the rationale for glucose monitoring  Identifies A1C, glucose targets, and testing times  Identifies proper sharps disposal  Defines the purpose of a diabetes food plan  Identifies carbohydrate food groups  Defines effects of carbohydrate foods on glucose levels  Identifies carbohydrate choices/grams/food labels  States benefits of physical activity and effect on glucose  Review of suggested activity guidelines  Handouts given during class include:  Type 2 Diabetes: Basics Book  My Food Plan Book  Food and Activity Log  Follow Up Goals Attend Core 2 and 3 classes.   

## 2012-07-03 ENCOUNTER — Encounter (HOSPITAL_COMMUNITY)
Admission: RE | Admit: 2012-07-03 | Discharge: 2012-07-03 | Disposition: A | Discharge status: Home / Self Care | Payer: Medicaid Other | Source: Ambulatory Visit | Attending: Cardiology | Admitting: Cardiology

## 2012-07-03 LAB — GLUCOSE, CAPILLARY
Glucose-Capillary: 160 mg/dL — ABNORMAL HIGH (ref 70–99)
Glucose-Capillary: 95 mg/dL (ref 70–99)

## 2012-07-03 NOTE — Progress Notes (Signed)
Pt started cardiac rehab today. Telemetry rhythm Sinus without ectopy. Cassidy [name] said she is out of her levemir and plans to get it from the pharmacy today.  Pre exercise CBG 160.  Cassidy [name] looked down after finishing exercise on the treadmill complained of feeling lightheaded.  Sitting and standing blood pressures are within  Normal limits.  Cassidy [name]'s post exercise CBG was 95. Cassidy [name] was given a half of a banana. Cassidy [name] plans to eat lunch after class. Cassidy [name] left cardiac rehab without complaints. 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 Cassidy [name] says she is out of her Wellbutrin, Neuron tin and simvastatin. I will contact Dr Elissa Hefty office about getting Cassidy [name]'s statin refilled. Cassidy [name] says she will get her other prescriptions filled on 07/06/2012. Will continue to monitor the patient throughout  the program.

## 2012-07-04 ENCOUNTER — Encounter: Payer: Self-pay | Admitting: Cardiovascular Disease

## 2012-07-05 ENCOUNTER — Encounter (HOSPITAL_COMMUNITY): Payer: Medicaid Other

## 2012-07-05 ENCOUNTER — Telehealth (HOSPITAL_COMMUNITY): Payer: Self-pay | Admitting: Internal Medicine

## 2012-07-07 ENCOUNTER — Telehealth: Payer: Self-pay | Admitting: *Deleted

## 2012-07-07 ENCOUNTER — Encounter (HOSPITAL_COMMUNITY)
Admission: RE | Admit: 2012-07-07 | Discharge: 2012-07-07 | Disposition: A | Discharge status: Home / Self Care | Payer: Medicaid Other | Source: Ambulatory Visit | Attending: Cardiology | Admitting: Cardiology

## 2012-07-07 NOTE — Telephone Encounter (Signed)
Message copied by Tobin Chad on Fri Jul 07, 2012  1:17 PM ------      Message from: Cammy Copa      Created: Mon Jul 03, 2012 12:48 PM      Regarding: Simvistatin       Good Wendall Papa,            Ms Barron started cardiac rehab today and is out of refills for her statin.  Would you be able to check into getting her refills?            Thanks for your help,            Have a good day!            Byrd Hesselbach       ------

## 2012-07-07 NOTE — Telephone Encounter (Signed)
Spoke to patient.rx had been sent

## 2012-07-10 ENCOUNTER — Encounter (HOSPITAL_COMMUNITY)
Admission: RE | Admit: 2012-07-10 | Discharge: 2012-07-10 | Disposition: A | Discharge status: Home / Self Care | Payer: Medicaid Other | Source: Ambulatory Visit | Attending: Cardiology | Admitting: Cardiology

## 2012-07-10 LAB — GLUCOSE, CAPILLARY: Glucose-Capillary: 124 mg/dL — ABNORMAL HIGH (ref 70–99)

## 2012-07-12 ENCOUNTER — Encounter (HOSPITAL_COMMUNITY)
Admission: RE | Admit: 2012-07-12 | Discharge: 2012-07-12 | Disposition: A | Discharge status: Home / Self Care | Payer: Medicaid Other | Source: Ambulatory Visit | Attending: Cardiology | Admitting: Cardiology

## 2012-07-12 DIAGNOSIS — I2510 Atherosclerotic heart disease of native coronary artery without angina pectoris: Secondary | ICD-10-CM | POA: Insufficient documentation

## 2012-07-12 DIAGNOSIS — I10 Essential (primary) hypertension: Secondary | ICD-10-CM | POA: Insufficient documentation

## 2012-07-12 DIAGNOSIS — Z951 Presence of aortocoronary bypass graft: Secondary | ICD-10-CM | POA: Insufficient documentation

## 2012-07-12 DIAGNOSIS — Z5189 Encounter for other specified aftercare: Secondary | ICD-10-CM | POA: Insufficient documentation

## 2012-07-14 ENCOUNTER — Encounter (HOSPITAL_COMMUNITY): Payer: Medicaid Other

## 2012-07-17 ENCOUNTER — Encounter (HOSPITAL_COMMUNITY)
Admission: RE | Admit: 2012-07-17 | Discharge: 2012-07-17 | Disposition: A | Discharge status: Home / Self Care | Payer: Medicaid Other | Source: Ambulatory Visit | Attending: Cardiology | Admitting: Cardiology

## 2012-07-17 ENCOUNTER — Ambulatory Visit (INDEPENDENT_AMBULATORY_CARE_PROVIDER_SITE_OTHER): Payer: Medicaid Other | Admitting: Surgery

## 2012-07-17 ENCOUNTER — Encounter (INDEPENDENT_AMBULATORY_CARE_PROVIDER_SITE_OTHER): Payer: Self-pay | Admitting: Surgery

## 2012-07-17 VITALS — BP 130/84 | HR 80 | Temp 97.3°F | Resp 16 | Ht 67.5 in | Wt 246.0 lb

## 2012-07-17 DIAGNOSIS — N644 Mastodynia: Secondary | ICD-10-CM | POA: Insufficient documentation

## 2012-07-17 MED ORDER — CEPHALEXIN 250 MG PO CAPS: 500.0000 mg | ORAL_CAPSULE | Freq: Three times a day (TID) | ORAL | Status: DC

## 2012-07-17 NOTE — Progress Notes (Signed)
The patient is doing much better from a cardiac standpoint. She is participating in cardiac rehabilitation. Recently she has experienced some pain in her lateral right breast. This is fairly tender to palpation.  Filed Vitals:   07/17/12 1513  BP: 130/84  Pulse: 80  Temp: 97.3 F (36.3 C)  Resp: 16   Her incision is healed with no sign of infection or induration. The medial right breast is soft and nontender. The upper outer quadrant of the left breast there is some tenderness but I cannot palpate any DP thickening. There is no erythema of the skin. No drainage noted. No warmth noted.  Considering the patient's history of repeated right breast abscesses, I would be more aggressive in treating a possible early infection. Currently I cannot palpate any areas that may represent an abscess so I don't think there is any indication for studying this area. However I would treat her with a one-week course of Keflex. We will recheck her in 2 weeks. She may use a heating pad over this area as well as ibuprofen.  Wilmon Arms. Corliss Skains, MD, [facility] Surgery  General/ Trauma Surgery  07/17/2012 3:36 PM

## 2012-07-18 ENCOUNTER — Ambulatory Visit: Payer: Medicaid Other | Admitting: Cardiovascular Disease

## 2012-07-18 ENCOUNTER — Encounter: Payer: Self-pay | Admitting: Cardiovascular Disease

## 2012-07-18 ENCOUNTER — Ambulatory Visit (INDEPENDENT_AMBULATORY_CARE_PROVIDER_SITE_OTHER): Payer: Medicaid Other | Admitting: Cardiovascular Disease

## 2012-07-18 VITALS — BP 136/86 | HR 98 | Ht 67.5 in | Wt 247.3 lb

## 2012-07-18 DIAGNOSIS — IMO0001 Reserved for inherently not codable concepts without codable children: Secondary | ICD-10-CM

## 2012-07-18 DIAGNOSIS — R079 Chest pain, unspecified: Secondary | ICD-10-CM

## 2012-07-18 DIAGNOSIS — G5790 Unspecified mononeuropathy of unspecified lower limb: Secondary | ICD-10-CM

## 2012-07-18 DIAGNOSIS — E7800 Pure hypercholesterolemia, unspecified: Secondary | ICD-10-CM

## 2012-07-18 DIAGNOSIS — I10 Essential (primary) hypertension: Secondary | ICD-10-CM

## 2012-07-18 DIAGNOSIS — E785 Hyperlipidemia, unspecified: Secondary | ICD-10-CM

## 2012-07-18 DIAGNOSIS — G5792 Unspecified mononeuropathy of left lower limb: Secondary | ICD-10-CM

## 2012-07-18 DIAGNOSIS — Z79899 Other long term (current) drug therapy: Secondary | ICD-10-CM

## 2012-07-18 DIAGNOSIS — E119 Type 2 diabetes mellitus without complications: Secondary | ICD-10-CM

## 2012-07-18 DIAGNOSIS — I119 Hypertensive heart disease without heart failure: Secondary | ICD-10-CM

## 2012-07-18 MED ORDER — CARVEDILOL 6.25 MG PO TABS: 6.2500 mg | ORAL_TABLET | Freq: Two times a day (BID) | ORAL | Status: DC

## 2012-07-18 NOTE — Patient Instructions (Signed)
Your physician recommends that you schedule a follow-up appointment in: 3 MONTHS. Your physician has recommended you make the following change in your medication: Increased your carvedilol up to 6.25 mg twice daily. This medication has already been sent to your pharmacy  Your physician has requested that you have a lower extremity arterial exercise duplex. During this test, exercise and ultrasound are used to evaluate arterial blood flow in the legs. Allow one hour for this exam. There are no restrictions or special instructions.  Your physician has requested that you have a lower or upper extremity venous duplex. This test is an ultrasound of the veins in the legs or arms. It looks at venous blood flow that carries blood from the heart to the legs or arms. Allow one hour for a Lower Venous exam. Allow thirty minutes for an Upper Venous exam. There are no restrictions or special instructions.

## 2012-07-19 ENCOUNTER — Encounter (HOSPITAL_COMMUNITY)
Admission: RE | Admit: 2012-07-19 | Discharge: 2012-07-19 | Disposition: A | Discharge status: Home / Self Care | Payer: Medicaid Other | Source: Ambulatory Visit | Attending: Cardiology | Admitting: Cardiology

## 2012-07-19 LAB — GLUCOSE, CAPILLARY: Glucose-Capillary: 174 mg/dL — ABNORMAL HIGH (ref 70–99)

## 2012-07-19 NOTE — Progress Notes (Signed)
Reviewed home exercise with pt today.  Pt plans to continue walking at home for exercise.  Reviewed THR, pulse, RPE, sign and symptoms, NTG use, and when to call 911 or MD.  Pt voiced understanding. Valaree Fresquez, MA, ACSM RCEP  

## 2012-07-20 ENCOUNTER — Ambulatory Visit (INDEPENDENT_AMBULATORY_CARE_PROVIDER_SITE_OTHER): Payer: Medicaid Other | Admitting: Obstetrics

## 2012-07-20 ENCOUNTER — Encounter: Payer: Self-pay | Admitting: Obstetrics

## 2012-07-20 VITALS — BP 110/77 | HR 93 | Temp 97.6°F | Wt 247.0 lb

## 2012-07-20 DIAGNOSIS — D259 Leiomyoma of uterus, unspecified: Secondary | ICD-10-CM | POA: Insufficient documentation

## 2012-07-20 DIAGNOSIS — Z0000 Encounter for general adult medical examination without abnormal findings: Secondary | ICD-10-CM

## 2012-07-20 DIAGNOSIS — Z113 Encounter for screening for infections with a predominantly sexual mode of transmission: Secondary | ICD-10-CM

## 2012-07-20 DIAGNOSIS — B9689 Other specified bacterial agents as the cause of diseases classified elsewhere: Secondary | ICD-10-CM | POA: Insufficient documentation

## 2012-07-20 DIAGNOSIS — N760 Acute vaginitis: Secondary | ICD-10-CM

## 2012-07-20 DIAGNOSIS — B373 Candidiasis of vulva and vagina: Secondary | ICD-10-CM

## 2012-07-20 DIAGNOSIS — A499 Bacterial infection, unspecified: Secondary | ICD-10-CM

## 2012-07-20 DIAGNOSIS — Z01419 Encounter for gynecological examination (general) (routine) without abnormal findings: Secondary | ICD-10-CM

## 2012-07-20 MED ORDER — FLUCONAZOLE 150 MG PO TABS: 150.0000 mg | ORAL_TABLET | Freq: Once | ORAL | Status: DC

## 2012-07-20 MED ORDER — TINIDAZOLE 500 MG PO TABS: 500.0000 mg | ORAL_TABLET | Freq: Every day | ORAL | Status: DC

## 2012-07-20 NOTE — Progress Notes (Signed)
Subjective:     Cassidy [name] is a 47 y.o. female here for a routine exam.  Current complaints: left lower abdominal pain, yeast infection, and requesting STD screening.  Personal health questionnaire reviewed: not asked.   Gynecologic History Patient's last menstrual period was 06/21/2012. Contraception: abstinence Last Pap: 08/2011. Results were: abnormal Last mammogram: 03/2012. Results were: normal     The following portions of the patient's history were reviewed and updated as appropriate: allergies, current medications, past family history, past medical history, past social history, past surgical history and problem list.  Review of Systems Pertinent items are noted in HPI.    Objective:    General appearance: alert and no distress Breasts: normal appearance, no masses or tenderness Abdomen: normal findings: soft, non-tender Pelvic: cervix normal in appearance, external genitalia normal, no adnexal masses or tenderness, no cervical motion tenderness, uterus normal size, shape, and consistency and vagina normal without discharge    Assessment:    Healthy female exam.   Uterine fibroids   Plan:    Education reviewed: calcium supplements, low fat, low cholesterol diet, safe sex/STD prevention, self breast exams, weight bearing exercise and management of fibroids. Follow up in: 1 year. Ultrasound ordered

## 2012-07-21 ENCOUNTER — Encounter (HOSPITAL_COMMUNITY)
Admission: RE | Admit: 2012-07-21 | Discharge: 2012-07-21 | Disposition: A | Discharge status: Home / Self Care | Payer: Medicaid Other | Source: Ambulatory Visit | Attending: Cardiology | Admitting: Cardiology

## 2012-07-21 ENCOUNTER — Encounter: Payer: Self-pay | Admitting: *Deleted

## 2012-07-21 ENCOUNTER — Telehealth: Payer: Self-pay | Admitting: *Deleted

## 2012-07-21 LAB — GLUCOSE, CAPILLARY: Glucose-Capillary: 161 mg/dL — ABNORMAL HIGH (ref 70–99)

## 2012-07-21 LAB — HEPATITIS B SURFACE ANTIGEN: Hepatitis B Surface Ag: NEGATIVE

## 2012-07-21 LAB — RPR

## 2012-07-21 LAB — GC/CHLAMYDIA PROBE AMP: CT Probe RNA: NEGATIVE

## 2012-07-21 LAB — HIV ANTIBODY (ROUTINE TESTING W REFLEX): HIV: NONREACTIVE

## 2012-07-21 LAB — HEPATITIS C ANTIBODY: HCV Ab: NEGATIVE

## 2012-07-21 NOTE — Telephone Encounter (Signed)
Message copied by Tobin Chad on Fri Jul 21, 2012  9:51 AM ------      Message from: Cassidy [name], DAVID      Created: Wed Jul 19, 2012  5:27 PM       Jasmine December - can we figure out how to forward these labs to this pt's PCP?            Marykay Lex, MD       ------

## 2012-07-22 ENCOUNTER — Encounter: Payer: Self-pay | Admitting: Cardiovascular Disease

## 2012-07-22 NOTE — Progress Notes (Signed)
Patient ID: Cassidy [name], female   DOB: Feb 09, 1965, 47 y.o.   MRN: [id number]     HPI: Cassidy [name], is a 47 y.o. female presents to the office today in followup of her recent percutaneous coronary intervention.  Cassidy [name] is a 68 year old obese African American female who has a long-standing history of hypertension, poorly controlled diabetes mellitus, tobacco use, as well as a history of hyperlipidemia and GERD. She was admitted to: Hospital in May after experiencing recurrent episodes of chest discomfort. Her chest pain was improved with nitroglycerin. She did have a positive d-dimer and chest CT was negative for PE although her coronary calcium score was significantly increased at 400. A nuclear perfusion study suggested ischemia. On 05/29/2012 she underwent cardiac catheterization which revealed normal LV function with moderate LVH and an ejection fraction of 60-65%. She did have coronary calcification with multivessel coronary artery disease with 20% narrowing in the LAD proximally followed by 50% stenosis, 20-30% proximal circumflex stenoses followed by 40-60% circumflex stenoses extending into the obtuse marginal branch, and a right coronary artery a 20% proximal narrowing, 20-30% mid narrowing there is a stenosis of 90-95% in the distal RCA extending into the ostium of the of the PDA and PLA. She underwent successful intervention utilizing angioscult cutting balloon with a 95% distal RCA stenosis being reduced to less than 20%, and 95-99% ostial PLA stenosis being reduced to 0%, and the PDA stenosis being reduced to 0%. Subsequently, she has been chest pain-free. She has noted some symptoms suggestive of possible claudication. At times he also has noticed some leg swelling.  Past Medical History  Diagnosis Date  . Hypertension   . GERD (gastroesophageal reflux disease)   . Breast abscess     recurrent; on right  . High cholesterol   . Type II diabetes mellitus   . Anemia   .  Sickle cell trait   . Fibromyalgia   . Genital warts   . Constipation   . Diabetic peripheral neuropathy   . Depression   . Bipolar depression   . PTSD (post-traumatic stress disorder)   . Complication of anesthesia     deep pain in legs and arms for 3 days post anesthesia   . Asthma     seasonal  . Tooth caries     pt. states she will have an extraction in 5/14 on tooth on bottom right  . Pain in limb     LEA VENOUS DUPLEX, 02/05/2009 - no evidence of deep vein thrombosis, Baker's cyst  . Chest pain     2D ECHO, 02/05/2009 - EF >55%,   . CAD (coronary artery disease) 05/2012    angiosculpt cutting balloon of distal RCA and ostium of branch extending into the PLA vessel and PDA branch. residual disease of LAD and LCX non obstructive, EF 60-65%    Past Surgical History  Procedure Laterality Date  . Bladder surgery  1980    "TVT"  . Irrigation and debridement abscess  12/19/2010    Procedure: IRRIGATION AND DEBRIDEMENT ABSCESS;  Surgeon: Emelia Loron, MD;  Location: [facility] OR;  Service: General;  Laterality: Right;  . Incise and drain abcess  ; 04/26/11; 08/13/11    right breast  . Tonsillectomy  1988  . Refractive surgery  ~ 2010    right  . Irrigation and debridement abscess  08/13/2011    Procedure: MINOR INCISION AND DRAINAGE OF ABSCESS;  Surgeon: Cherylynn Ridges, MD;  Location: [facility] OR;  Service:  General;  Laterality: Right;  Right Breast   . Irrigation and debridement abscess  11/17/2011    Procedure: IRRIGATION AND DEBRIDEMENT ABSCESS;  Surgeon: Wilmon Arms. Corliss Skains, MD;  Location: [facility] OR;  Service: General;  Laterality: Right;  irrigation and debridement right recurrent breast abscess  . Breast surgery Right     I&D for multiple abscesses  . Eye surgery Left     laser surgery  . Larynx surgery    . Ployp removed from voice box 03/30/12    . Incision and drainage abscess Right 05/09/2012    Procedure: INCISION AND DRAINAGE RIGHT BREAST ABSCESS;  Surgeon: Wilmon Arms. Corliss Skains, MD;  Location:  [facility] OR;  Service: General;  Laterality: Right;  . Nm myoview ltd  01/26/2009    Normal study, no evidence of ischemia, EF 67%  . Cutting balloon    . Cardiac catheterization  05/2012    see medical Hx.    No Known Allergies  Current Outpatient Prescriptions  Medication Sig Dispense Refill  . acyclovir (ZOVIRAX) 200 MG capsule Take by mouth 2 (two) times daily.      Marland Kitchen albuterol (PROVENTIL HFA;VENTOLIN HFA) 108 (90 BASE) MCG/ACT inhaler Inhale 2 puffs into the lungs every 6 (six) hours as needed for wheezing.      Marland Kitchen aspirin 81 MG chewable tablet Chew 162 mg by mouth daily.      Marland Kitchen buPROPion (WELLBUTRIN XL) 150 MG 24 hr tablet Take 150 mg by mouth daily.      . cephALEXin (KEFLEX) 250 MG capsule Take 2 capsules (500 mg total) by mouth 3 (three) times daily.  21 capsule  0  . clopidogrel (PLAVIX) 75 MG tablet Take 1 tablet (75 mg total) by mouth daily.  30 tablet  6  . Cyanocobalamin (B-12 PO) Take by mouth daily.      . divalproex (DEPAKOTE ER) 500 MG 24 hr tablet Take 500 mg by mouth at bedtime.       . fesoterodine (TOVIAZ) 4 MG TB24 Take 4 mg by mouth daily.      . fluticasone (VERAMYST) 27.5 MCG/SPRAY nasal spray Place 2 sprays into the nose daily as needed.       . gabapentin (NEURONTIN) 300 MG capsule Take 300 mg by mouth 2 (two) times daily.      . insulin aspart (NOVOLOG) 100 UNIT/ML injection Inject 8 Units into the skin 3 (three) times daily before meals.  1 vial  1  . lisinopril (PRINIVIL,ZESTRIL) 10 MG tablet Take 10 mg by mouth daily.      Marland Kitchen omeprazole (PRILOSEC) 20 MG capsule Take 20 mg by mouth daily.      Marland Kitchen PARoxetine (PAXIL-CR) 37.5 MG 24 hr tablet Take 37.5 mg by mouth daily.       . pentosan polysulfate (ELMIRON) 100 MG capsule Take 100 mg by mouth daily as needed. PRN per patient      . simvastatin (ZOCOR) 20 MG tablet Take 1 tablet (20 mg total) by mouth daily at 6 PM.  30 tablet  0  . carvedilol (COREG) 6.25 MG tablet Take 1 tablet (6.25 mg total) by mouth 2 (two) times  daily.  60 tablet  6  . fluconazole (DIFLUCAN) 150 MG tablet Take 1 tablet (150 mg total) by mouth once.  1 tablet  4  . insulin detemir (LEVEMIR) 100 UNIT/ML injection Inject 35 Units into the skin 2 (two) times daily.      Marland Kitchen tinidazole (TINDAMAX) 500 MG tablet Take 1  tablet (500 mg total) by mouth daily with breakfast.  10 tablet  2   No current facility-administered medications for this visit.    Socially she is single. She has one child. She completed 12 years of high school and 2 years of college. He does clean her house and shop but does not do any yard work or exercise. She does have a tobacco history. There is no alcohol use.  ROS is negative for fevers, chills or night sweats.  She denies recurrent chest pain. She does not some mild shortness of breath with activity. She has had diabetes for at least 7 years. She also has history of significant obesity. She does note cramps in her leg when she walks. At times she also notes some paresthesias times there is some leg swelling. She denies bleeding. She denies nausea vomiting or diarrhea. She denies GU symptoms. Other system review is negative.  PE BP 136/86  Pulse 98  Ht 5' 7.5" (1.715 m)  Wt 247 lb 4.8 oz (112.175 kg)  BMI 38.14 kg/m2  General: Alert, oriented, no distress.  Skin: normal turgor, no rashes HEENT: Normocephalic, atraumatic. Pupils round and reactive; sclera anicteric;no lid lag.  Nose without nasal septal hypertrophy Mouth/Parynx benign; Mallinpatti scale 3 Neck: No JVD, no carotid briuts Lungs: clear to ausculatation and percussion; no wheezing or rales Heart: RRR, s1 s2 normal 1/6 systolic murmur at the Abdomen: soft, nontender; no hepatosplenomehaly, BS+; abdominal aorta nontender and not dilated by palpation. Pulses 2+ Right groin catheterization site stable without ecchymoses. She does have bilateral femoral bruits. Extremities: no clubbing cyanosis or edema, Homan's sign negative  Neurologic: grossly  nonfocal  ECG:  Sinus rhythm at 98 beats per minute no significant ST-T changes.  LABS:  BMET    Component Value Date/Time   NA 134* 05/30/2012 0355   K 4.0 05/30/2012 0355   CL 102 05/30/2012 0355   CO2 20 05/30/2012 0355   GLUCOSE 237* 05/30/2012 0355   BUN 10 05/30/2012 0355   CREATININE 0.54 05/30/2012 0355   CALCIUM 9.0 05/30/2012 0355   GFRNONAA >90 05/30/2012 0355   GFRAA >90 05/30/2012 0355     Hepatic Function Panel     Component Value Date/Time   PROT 7.2 05/26/2012 0505   ALBUMIN 3.8 05/26/2012 0505   AST 10 05/26/2012 0505   ALT 12 05/26/2012 0505   ALKPHOS 104 05/26/2012 0505   BILITOT 0.3 05/26/2012 0505   BILIDIR <0.1 02/04/2009 1912   IBILI NOT CALCULATED 02/04/2009 1912     CBC    Component Value Date/Time   WBC 11.1* 05/30/2012 0355   RBC 4.27 05/30/2012 0355   HGB 12.6 05/30/2012 0355   HCT 35.3* 05/30/2012 0355   PLT 282 05/30/2012 0355   MCV 82.7 05/30/2012 0355   MCH 29.5 05/30/2012 0355   MCHC 35.7 05/30/2012 0355   RDW 13.1 05/30/2012 0355   LYMPHSABS 4.2* 05/26/2012 0505   MONOABS 0.6 05/26/2012 0505   EOSABS 0.2 05/26/2012 0505   BASOSABS 0.0 05/26/2012 0505     BNP No results found for this basename: probnp    Lipid Panel     Component Value Date/Time   CHOL 176 05/28/2012 0605   TRIG 149 05/28/2012 0605   HDL 38* 05/28/2012 0605   CHOLHDL 4.6 05/28/2012 0605   VLDL 30 05/28/2012 0605   LDLCALC 108* 05/28/2012 0605     RADIOLOGY: No results found.    ASSESSMENT AND PLAN: Ms. Khaleesi Gruel is 2 months  status post percutaneous cardiac intervention to her distal RCA, ostium of the PDA and PLA vessels. She has felt significant improvement with resolution of prior anginal symptomatology. She is not well beta blocked on her very low dose carvedilol and we'll further titrate this to 6.25 mg twice a day to she does have significant coronary artery disease and does also complain of symptoms suggestive of possible claudication to her lower extremities. She  also does note some issues with the varicose veins and leg swelling intermittently. I am recommending she undergo followup laboratory consisting of a CBC, CMP, hemoglobin A1c, and NMR lipoprofile particularly with her diabetes mellitus to assess  LDL particle number, as well as TSH. I am scheduling her for lower extremity arterial Doppler studies as well as venous insufficiency study. I'll see her back in the office in 3 months for followup cardiology evaluation.    Lennette Bihari, MD, [facility]  07/22/2012 1:55 PM

## 2012-07-24 ENCOUNTER — Other Ambulatory Visit: Payer: Self-pay | Admitting: Obstetrics

## 2012-07-24 ENCOUNTER — Ambulatory Visit (HOSPITAL_COMMUNITY)
Admission: RE | Admit: 2012-07-24 | Discharge: 2012-07-24 | Disposition: A | Discharge status: Home / Self Care | Payer: Medicaid Other | Source: Ambulatory Visit | Attending: Cardiology | Admitting: Cardiology

## 2012-07-24 ENCOUNTER — Encounter (HOSPITAL_COMMUNITY)
Admission: RE | Admit: 2012-07-24 | Discharge: 2012-07-24 | Disposition: A | Discharge status: Home / Self Care | Payer: Medicaid Other | Source: Ambulatory Visit | Attending: Cardiology | Admitting: Cardiology

## 2012-07-24 DIAGNOSIS — R1031 Right lower quadrant pain: Secondary | ICD-10-CM | POA: Insufficient documentation

## 2012-07-24 DIAGNOSIS — D259 Leiomyoma of uterus, unspecified: Secondary | ICD-10-CM

## 2012-07-24 DIAGNOSIS — N854 Malposition of uterus: Secondary | ICD-10-CM | POA: Insufficient documentation

## 2012-07-24 LAB — GLUCOSE, CAPILLARY: Glucose-Capillary: 171 mg/dL — ABNORMAL HIGH (ref 70–99)

## 2012-07-24 LAB — PAP IG W/ RFLX HPV ASCU

## 2012-07-24 NOTE — Progress Notes (Signed)
Cassidy [name] complained of left sided breast 5 minutes into  ambulation on the treadmill today.  Blood pressure 118/60.   Cassidy [name] rates the pain a 6 out of 1-10 scale.  Exercise stopped.  Patient taken to the treatment room and placed on oxygen at 4l/min.  Cassidy [name] reported the pain in her left breat a 3 after a few minutes of rest.  Cassidy Doe PA called and notified. 12 lead ECG obtained. 12 lead ECg showed normal sinus rhythm. Cassidy [name] reported a brief period of nausea which resolved. Cassidy [name] ate a spicy [place] sub in subway prior to exercise.  At 1430 Cassidy [name] denies any further chest pain. [name] [facility] to evaluate patient at cardiac rehab. 1430 Oxygen dc'd.

## 2012-07-24 NOTE — Progress Notes (Signed)
[name] [facility] evaluated Cassidy [name] and said Cassidy [name] is okay to go home and can return to exercise on Wednesday.

## 2012-07-25 ENCOUNTER — Ambulatory Visit (HOSPITAL_COMMUNITY)
Admission: RE | Admit: 2012-07-25 | Discharge: 2012-07-25 | Disposition: A | Discharge status: Home / Self Care | Payer: Medicaid Other | Source: Ambulatory Visit | Attending: Obstetrics | Admitting: Obstetrics

## 2012-07-25 DIAGNOSIS — R1031 Right lower quadrant pain: Secondary | ICD-10-CM | POA: Insufficient documentation

## 2012-07-25 DIAGNOSIS — D259 Leiomyoma of uterus, unspecified: Secondary | ICD-10-CM | POA: Insufficient documentation

## 2012-07-25 DIAGNOSIS — N854 Malposition of uterus: Secondary | ICD-10-CM | POA: Insufficient documentation

## 2012-07-26 ENCOUNTER — Encounter (HOSPITAL_COMMUNITY)
Admission: RE | Admit: 2012-07-26 | Discharge: 2012-07-26 | Disposition: A | Discharge status: Home / Self Care | Payer: Medicaid Other | Source: Ambulatory Visit | Attending: Cardiology | Admitting: Cardiology

## 2012-07-26 NOTE — Progress Notes (Signed)
Cassidy [name] 47 y.o. female Nutrition Note Spoke with pt.  Nutrition Plan and Nutrition Survey goals reviewed with pt. Pt is following Step 1 of the Therapeutic Lifestyle Changes diet. Pt wants to lose wt. Pt has been trying to lose wt by exercising and eating "healthier." Pt reports she receives $200/month for herself and her son from the food stamp program. Pt has previously accessed food pantries near where she lives. Pt states it has become more difficult to go to the food pantry since "I don't have transportation." Pt is trying to quit smoking. Pt has decreased cigarettes from 1 pack a day to 6 cigarettes/d. Pt expressed being overwhelmed with the number of medications and her medical conditions. Pt struggling with "family (e.g. Pt's 35 y.o. Son) stress." Prioritizing lifestyle changes desired discussed. Pt is diabetic. Last A1c indicates blood glucose not well-controlled. Per discussion with pt, "this is the lowest my blood sugars have ever been." Pt reports she is [facility] for herself and her son. Pt expressed understanding of the information reviewed. Pt aware of nutrition education classes offered and plans on attending nutrition classes. Nutrition Diagnosis   Food-and nutrition-related knowledge deficit related to lack of exposure to information as related to diagnosis of: ? CVD ? DM (A1c 10.2)    Obesity related to excessive energy intake as evidenced by a BMI of 37.5 Nutrition RX/ Estimated Daily Nutrition Needs for: wt loss  1500-2000 Kcal, 40-55 gm fat, 10-16 gm sat fat, 1.5-2.0 gm trans-fat, <1500 mg sodium , 175-250 gm CHO  Nutrition Intervention   Pt's individual nutrition plan reviewed with pt.   Benefits of adopting Therapeutic Lifestyle Changes discussed when Medficts reviewed.   Pt to attend the Portion Distortion class   Pt to attend the  ? Nutrition I class                     ? Nutrition II class   Continue client-centered nutrition  education by RD, as part of interdisciplinary care. Goal(s)   Pt to identify and limit food sources of saturated fat, trans fat, and cholesterol   Pt to identify food quantities necessary to achieve: ? wt loss to a goal wt of 222-240 lb (101.1-109.3 kg) at graduation from cardiac rehab.    CBG concentrations in the normal range or as close to normal as is safely possible. Monitor and Evaluate progress toward nutrition goal with team. Nutrition Risk: High Mickle Plumb, M.Ed, RD, LDN, CDE 07/26/2012 12:28 PM

## 2012-07-28 ENCOUNTER — Encounter (HOSPITAL_COMMUNITY)
Admission: RE | Admit: 2012-07-28 | Discharge: 2012-07-28 | Disposition: A | Discharge status: Home / Self Care | Payer: Medicaid Other | Source: Ambulatory Visit | Attending: Cardiology | Admitting: Cardiology

## 2012-07-28 LAB — GLUCOSE, CAPILLARY
Glucose-Capillary: 114 mg/dL — ABNORMAL HIGH (ref 70–99)
Glucose-Capillary: 118 mg/dL — ABNORMAL HIGH (ref 70–99)

## 2012-07-28 NOTE — Telephone Encounter (Signed)
Faxed labs to T. Dareen Piano NP

## 2012-07-31 ENCOUNTER — Encounter (HOSPITAL_COMMUNITY)
Admission: RE | Admit: 2012-07-31 | Discharge: 2012-07-31 | Disposition: A | Discharge status: Home / Self Care | Payer: Medicaid Other | Source: Ambulatory Visit | Attending: Cardiology | Admitting: Cardiology

## 2012-08-01 ENCOUNTER — Encounter (INDEPENDENT_AMBULATORY_CARE_PROVIDER_SITE_OTHER): Payer: Self-pay | Admitting: Surgery

## 2012-08-01 ENCOUNTER — Ambulatory Visit (INDEPENDENT_AMBULATORY_CARE_PROVIDER_SITE_OTHER): Payer: Medicaid Other | Admitting: Surgery

## 2012-08-01 VITALS — BP 128/81 | HR 78 | Temp 96.2°F | Resp 14 | Ht 67.5 in | Wt 247.8 lb

## 2012-08-01 DIAGNOSIS — N644 Mastodynia: Secondary | ICD-10-CM

## 2012-08-01 MED ORDER — CEPHALEXIN 250 MG PO CAPS: 500.0000 mg | ORAL_CAPSULE | Freq: Three times a day (TID) | ORAL | Status: DC

## 2012-08-01 NOTE — Progress Notes (Signed)
Followup of her right breast pain. The tenderness in the lateral right breast improved after a short course of Keflex. She still has some residual tenderness in this area. No swelling or deep thickening. No erythema of the skin. No drainage from the nipple. Considering the patient's long course of deep breast abscesses we will continue to treat her with 2 more weeks of antibiotics. We will see her back as needed.  The patient is doing very well with her cardiac rehabilitation. 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 She will call us if she has any recurrence of her symptoms.  Wilmon Arms. Corliss Skains, MD, [facility] Surgery  General/ Trauma Surgery  08/01/2012 10:48 AM

## 2012-08-02 ENCOUNTER — Telehealth: Payer: Self-pay | Admitting: Cardiology

## 2012-08-02 ENCOUNTER — Encounter (HOSPITAL_COMMUNITY)
Admission: RE | Admit: 2012-08-02 | Discharge: 2012-08-02 | Disposition: A | Discharge status: Home / Self Care | Payer: Medicaid Other | Source: Ambulatory Visit | Attending: Cardiology | Admitting: Cardiology

## 2012-08-02 NOTE — Telephone Encounter (Signed)
Cleared for arterial and venous studies.  Corine Shelter PA-C 08/02/2012 1:09 PM

## 2012-08-03 ENCOUNTER — Ambulatory Visit (HOSPITAL_BASED_OUTPATIENT_CLINIC_OR_DEPARTMENT_OTHER)
Admission: RE | Admit: 2012-08-03 | Discharge: 2012-08-03 | Disposition: A | Discharge status: Home / Self Care | Payer: Medicaid Other | Source: Ambulatory Visit | Attending: Cardiovascular Disease | Admitting: Cardiovascular Disease

## 2012-08-03 ENCOUNTER — Ambulatory Visit (HOSPITAL_COMMUNITY)
Admission: RE | Admit: 2012-08-03 | Discharge: 2012-08-03 | Disposition: A | Discharge status: Home / Self Care | Payer: Medicaid Other | Source: Ambulatory Visit | Attending: Cardiovascular Disease | Admitting: Cardiovascular Disease

## 2012-08-03 DIAGNOSIS — G5790 Unspecified mononeuropathy of unspecified lower limb: Secondary | ICD-10-CM

## 2012-08-03 DIAGNOSIS — G5792 Unspecified mononeuropathy of left lower limb: Secondary | ICD-10-CM

## 2012-08-03 DIAGNOSIS — I70219 Atherosclerosis of native arteries of extremities with intermittent claudication, unspecified extremity: Secondary | ICD-10-CM

## 2012-08-03 DIAGNOSIS — M79609 Pain in unspecified limb: Secondary | ICD-10-CM

## 2012-08-03 NOTE — Progress Notes (Signed)
Lower Extremity Arterial Duplex Completed. °Cassidy [name] ° °

## 2012-08-03 NOTE — Progress Notes (Signed)
Lower Extremity Venous Duplex Completed. °Cassidy [name] ° °

## 2012-08-04 ENCOUNTER — Encounter (HOSPITAL_COMMUNITY)
Admission: RE | Admit: 2012-08-04 | Discharge: 2012-08-04 | Disposition: A | Discharge status: Home / Self Care | Payer: Medicaid Other | Source: Ambulatory Visit | Attending: Cardiology | Admitting: Cardiology

## 2012-08-04 LAB — GLUCOSE, CAPILLARY
Glucose-Capillary: 97 mg/dL (ref 70–99)
Glucose-Capillary: 109 mg/dL — ABNORMAL HIGH (ref 70–99)

## 2012-08-04 NOTE — Progress Notes (Signed)
Nutrition Note Spoke with pt. Pt fasting CBG this am reportedly 117 mg/dL, which according to the pt is "anything less than 120 is low for me." Pt took her Levemir and Novolog and ate chicken pasta "on the way over here." Pre-exercise CBG was 97 mg/dL. Pt given a banana and lemonade. CBG decreased. Pt given another lemonade and CBG came up to 109 mg/dL. Pt's insulin and CBG's discussed with pt. If pt's fasting CBG < 120 mg/dL, pt is going to try to hold her Novolog and bring it with her to rehab. Pt was surprised with her CBG's being "low" that she did not feel s/s of hypoglycemia. Hypoglycemia versus CBG too low for exercise clarified. Pt expressed understanding of the information reviewed. Continue client-centered nutrition education by RD as part of interdisciplinary care.  Monitor and evaluate progress toward nutrition goal with team.  Mickle Plumb, M.Ed, RD, LDN, CDE 08/04/2012 11:55 AM

## 2012-08-07 ENCOUNTER — Encounter (HOSPITAL_COMMUNITY)
Admission: RE | Admit: 2012-08-07 | Discharge: 2012-08-07 | Disposition: A | Discharge status: Home / Self Care | Payer: Medicaid Other | Source: Ambulatory Visit | Attending: Cardiology | Admitting: Cardiology

## 2012-08-07 LAB — COMPREHENSIVE METABOLIC PANEL
CO2: 27 mEq/L (ref 19–32)
Calcium: 9.4 mg/dL (ref 8.4–10.5)
Glucose, Bld: 127 mg/dL — ABNORMAL HIGH (ref 70–99)
Sodium: 140 mEq/L (ref 135–145)
Total Bilirubin: 0.7 mg/dL (ref 0.3–1.2)
Total Protein: 7.2 g/dL (ref 6.0–8.3)

## 2012-08-07 LAB — CBC
Hemoglobin: 12.8 g/dL (ref 12.0–15.0)
MCH: 29.4 pg (ref 26.0–34.0)
MCV: 86 fL (ref 78.0–100.0)
RBC: 4.36 MIL/uL (ref 3.87–5.11)

## 2012-08-07 LAB — GLUCOSE, CAPILLARY
Glucose-Capillary: 166 mg/dL — ABNORMAL HIGH (ref 70–99)
Glucose-Capillary: 112 mg/dL — ABNORMAL HIGH (ref 70–99)

## 2012-08-07 LAB — HEMOGLOBIN A1C
Hgb A1c MFr Bld: 6.8 % — ABNORMAL HIGH (ref <5.7)
Mean Plasma Glucose: 148 mg/dL — ABNORMAL HIGH (ref <117)

## 2012-08-08 LAB — NMR LIPOPROFILE WITH LIPIDS
HDL Size: 8.5 nm — ABNORMAL LOW (ref >=9.2)
HDL-C: 42 mg/dL (ref >=40)
LDL Size: 20 nm — ABNORMAL LOW (ref >20.5)
Large HDL-P: <1.3 umol/L — ABNORMAL LOW (ref >=4.8)
Large VLDL-P: 1.8 nmol/L (ref <=2.7)
Small LDL Particle Number: 978 nmol/L — ABNORMAL HIGH (ref <=527)

## 2012-08-09 ENCOUNTER — Encounter: Payer: Self-pay | Admitting: *Deleted

## 2012-08-09 ENCOUNTER — Telehealth (HOSPITAL_COMMUNITY): Payer: Self-pay | Admitting: Nurse Practitioner

## 2012-08-09 ENCOUNTER — Encounter (HOSPITAL_COMMUNITY): Payer: Medicaid Other

## 2012-08-11 ENCOUNTER — Encounter (HOSPITAL_COMMUNITY): Payer: Medicaid Other

## 2012-08-11 ENCOUNTER — Telehealth (HOSPITAL_COMMUNITY): Payer: Self-pay | Admitting: Nurse Practitioner

## 2012-08-14 ENCOUNTER — Encounter (HOSPITAL_COMMUNITY)
Admission: RE | Admit: 2012-08-14 | Discharge: 2012-08-14 | Disposition: A | Discharge status: Home / Self Care | Payer: Medicaid Other | Source: Ambulatory Visit | Attending: Cardiology | Admitting: Cardiology

## 2012-08-14 DIAGNOSIS — I10 Essential (primary) hypertension: Secondary | ICD-10-CM | POA: Insufficient documentation

## 2012-08-14 DIAGNOSIS — Z951 Presence of aortocoronary bypass graft: Secondary | ICD-10-CM | POA: Insufficient documentation

## 2012-08-14 DIAGNOSIS — I2510 Atherosclerotic heart disease of native coronary artery without angina pectoris: Secondary | ICD-10-CM | POA: Insufficient documentation

## 2012-08-14 DIAGNOSIS — Z5189 Encounter for other specified aftercare: Secondary | ICD-10-CM | POA: Insufficient documentation

## 2012-08-16 ENCOUNTER — Encounter (HOSPITAL_COMMUNITY)
Admission: RE | Admit: 2012-08-16 | Discharge: 2012-08-16 | Disposition: A | Discharge status: Home / Self Care | Payer: Medicaid Other | Source: Ambulatory Visit | Attending: Cardiology | Admitting: Cardiology

## 2012-08-16 LAB — GLUCOSE, CAPILLARY
Glucose-Capillary: 101 mg/dL — ABNORMAL HIGH (ref 70–99)
Glucose-Capillary: 94 mg/dL (ref 70–99)

## 2012-08-16 NOTE — Progress Notes (Signed)
Cassidy [name] did not exercise this morning due to CBG less than 110. Cassidy [name] plans to return to exercise on Friday. I reviewed Cassidy [name]'s quality of life questionnaire.  Cassidy [name] feels better about herself since she has been participating in outpatient  Cardiac rehab.

## 2012-08-16 NOTE — Progress Notes (Signed)
Nutrition Note Spoke with pt. Fasting CBG this morning reportedly 112 mg/dL. Pt took her Levemir and Novolog. Pt CBG before exercise 94 mg/dL. Pt given a banana and lemonade/Tang and CBG increased to 101 mg/dL. Pt taking 35 units of Levemir BID and 8 units of Novolog before meals. Pt again agreed to trial of holding Novolog if CBG < 120 mg/dL on Cardiac Rehab days and bringing Novolog to exercise. Continue client-centered nutrition education by RD as part of interdisciplinary care.  Monitor and evaluate progress toward nutrition goal with team.  Mickle Plumb, M.Ed, RD, LDN, CDE 08/16/2012 12:24 PM

## 2012-08-18 ENCOUNTER — Encounter (HOSPITAL_COMMUNITY)
Admission: RE | Admit: 2012-08-18 | Discharge: 2012-08-18 | Disposition: A | Discharge status: Home / Self Care | Payer: Medicaid Other | Source: Ambulatory Visit | Attending: Cardiology | Admitting: Cardiology

## 2012-08-21 ENCOUNTER — Encounter (HOSPITAL_COMMUNITY)
Admission: RE | Admit: 2012-08-21 | Discharge: 2012-08-21 | Disposition: A | Discharge status: Home / Self Care | Payer: Medicaid Other | Source: Ambulatory Visit | Attending: Cardiology | Admitting: Cardiology

## 2012-08-23 ENCOUNTER — Encounter (HOSPITAL_COMMUNITY)
Admission: RE | Admit: 2012-08-23 | Discharge: 2012-08-23 | Disposition: A | Discharge status: Home / Self Care | Payer: Medicaid Other | Source: Ambulatory Visit | Attending: Cardiology | Admitting: Cardiology

## 2012-08-23 ENCOUNTER — Telehealth: Payer: Self-pay | Admitting: *Deleted

## 2012-08-23 NOTE — Telephone Encounter (Signed)
Returning your call. °

## 2012-08-25 ENCOUNTER — Other Ambulatory Visit: Payer: Self-pay | Admitting: *Deleted

## 2012-08-25 ENCOUNTER — Telehealth: Payer: Self-pay | Admitting: *Deleted

## 2012-08-25 ENCOUNTER — Telehealth (HOSPITAL_COMMUNITY): Payer: Self-pay | Admitting: Nurse Practitioner

## 2012-08-25 ENCOUNTER — Encounter (HOSPITAL_COMMUNITY): Payer: Medicaid Other

## 2012-08-25 MED ORDER — EZETIMIBE-SIMVASTATIN 10-20 MG PO TABS: 1.0000 | ORAL_TABLET | Freq: Every day | ORAL | Status: DC

## 2012-08-25 NOTE — Telephone Encounter (Signed)
Left message per Dr. Tresa Endo that he would like to change her medicaton from simvastatin to Vytorin 10/20 to better control her cholesterol. She is to stoop the simvastatin and start the new prescription. I  have already sent this to her pharmacy.

## 2012-08-28 ENCOUNTER — Encounter (HOSPITAL_COMMUNITY): Payer: Medicaid Other

## 2012-08-28 ENCOUNTER — Telehealth (HOSPITAL_COMMUNITY): Payer: Self-pay | Admitting: Nurse Practitioner

## 2012-08-30 ENCOUNTER — Encounter (HOSPITAL_COMMUNITY)
Admission: RE | Admit: 2012-08-30 | Discharge: 2012-08-30 | Disposition: A | Discharge status: Home / Self Care | Payer: Medicaid Other | Source: Ambulatory Visit | Attending: Cardiology | Admitting: Cardiology

## 2012-08-31 ENCOUNTER — Encounter: Payer: Medicaid Other | Attending: Internal Medicine

## 2012-08-31 DIAGNOSIS — E119 Type 2 diabetes mellitus without complications: Secondary | ICD-10-CM | POA: Insufficient documentation

## 2012-08-31 DIAGNOSIS — Z713 Dietary counseling and surveillance: Secondary | ICD-10-CM | POA: Insufficient documentation

## 2012-08-31 NOTE — Progress Notes (Signed)
Patient was seen on 08/31/12 for the second of a series of three diabetes self-management courses at the [facility]. The following learning objectives were met by the patient during this course:   Explain basic nutrition maintenance and quality assurance  Describe causes, symptoms and treatment of hypoglycemia and hyperglycemia  Explain how to manage diabetes during illness  Describe the importance of good nutrition for health and healthy eating strategies  List strategies to follow meal plan when dining out  Describe the effects of alcohol on glucose and how to use it safely  Describe problem solving skills for day-to-day glucose challenges  Describe strategies to use when treatment plan needs to change  Identify important factors involved in successful weight loss  Describe ways to remain physically active  Describe the impact of regular activity on insulin resistance  Identify current diabetes medications, their action on blood glucose, and [pssible side effects.  Handouts given in class:  Refrigerator magnet for Sick Day Guidelines  [facility] Oral medication/insulin handout  Your patient has identified their diabetes self-care support plan as:  [facility] support group   Follow-Up Plan: Patient will attend the final class of the ADA Diabetes Self-Care Education.

## 2012-09-01 ENCOUNTER — Encounter (HOSPITAL_COMMUNITY): Payer: Medicaid Other

## 2012-09-04 ENCOUNTER — Encounter (HOSPITAL_COMMUNITY)
Admission: RE | Admit: 2012-09-04 | Discharge: 2012-09-04 | Disposition: A | Discharge status: Home / Self Care | Payer: Medicaid Other | Source: Ambulatory Visit | Attending: Cardiology | Admitting: Cardiology

## 2012-09-04 LAB — GLUCOSE, CAPILLARY
Glucose-Capillary: 176 mg/dL — ABNORMAL HIGH (ref 70–99)
Glucose-Capillary: 116 mg/dL — ABNORMAL HIGH (ref 70–99)

## 2012-09-05 ENCOUNTER — Other Ambulatory Visit: Payer: Self-pay | Admitting: Cardiology

## 2012-09-06 ENCOUNTER — Telehealth (HOSPITAL_COMMUNITY): Payer: Self-pay | Admitting: *Deleted

## 2012-09-06 ENCOUNTER — Encounter (HOSPITAL_COMMUNITY): Payer: Medicaid Other

## 2012-09-07 NOTE — Telephone Encounter (Signed)
Please advise. Patient hasn't been seen since 12/23/2009.

## 2012-09-08 ENCOUNTER — Encounter (HOSPITAL_COMMUNITY)
Admission: RE | Admit: 2012-09-08 | Discharge: 2012-09-08 | Disposition: A | Discharge status: Home / Self Care | Payer: Medicaid Other | Source: Ambulatory Visit | Attending: Cardiology | Admitting: Cardiology

## 2012-09-11 ENCOUNTER — Encounter (HOSPITAL_COMMUNITY): Payer: Medicaid Other

## 2012-09-11 DIAGNOSIS — Z5189 Encounter for other specified aftercare: Secondary | ICD-10-CM | POA: Insufficient documentation

## 2012-09-11 DIAGNOSIS — I10 Essential (primary) hypertension: Secondary | ICD-10-CM | POA: Insufficient documentation

## 2012-09-11 DIAGNOSIS — Z951 Presence of aortocoronary bypass graft: Secondary | ICD-10-CM | POA: Insufficient documentation

## 2012-09-11 DIAGNOSIS — I2510 Atherosclerotic heart disease of native coronary artery without angina pectoris: Secondary | ICD-10-CM | POA: Insufficient documentation

## 2012-09-13 ENCOUNTER — Encounter (HOSPITAL_COMMUNITY)
Admission: RE | Admit: 2012-09-13 | Discharge: 2012-09-13 | Disposition: A | Discharge status: Home / Self Care | Payer: Medicaid Other | Source: Ambulatory Visit | Attending: Cardiology | Admitting: Cardiology

## 2012-09-13 ENCOUNTER — Telehealth: Payer: Self-pay | Admitting: Cardiovascular Disease

## 2012-09-13 NOTE — Telephone Encounter (Signed)
Please call-concerning her medicine.

## 2012-09-13 NOTE — Telephone Encounter (Signed)
Called and spoke to pharmacist . He stated vytorin needs prior authorization . Left message on patient  voicemail .  Samples left at the front desk.  Obtaining prior auth.  form from [facility]

## 2012-09-13 NOTE — Telephone Encounter (Signed)
Amber forwarded this to me.

## 2012-09-13 NOTE — Telephone Encounter (Signed)
Please call-her insurance will not pay for her Cholesterol medicine-have not had any medicine since 8-20.

## 2012-09-13 NOTE — Telephone Encounter (Signed)
Message forwarded to W. Waddell, CMA.  

## 2012-09-14 ENCOUNTER — Encounter: Payer: Medicaid Other | Attending: Internal Medicine

## 2012-09-14 DIAGNOSIS — E119 Type 2 diabetes mellitus without complications: Secondary | ICD-10-CM | POA: Insufficient documentation

## 2012-09-14 DIAGNOSIS — Z713 Dietary counseling and surveillance: Secondary | ICD-10-CM | POA: Insufficient documentation

## 2012-09-14 NOTE — Progress Notes (Signed)
Patient was seen on 09/14/12 for the third of a series of three diabetes self-management courses at the [facility]. The following learning objectives were met by the patient during this course:    Describe how diabetes changes over time   Identify diabetes complications and ways to prevent them   Describe strategies that can promote heart health including lowering blood pressure and cholesterol   Describe strategies to lower dietary fat and sodium in the diet   Identify physical activities that benefit cardiovascular health   Describe role of stress on blood glucose and develop strategies to address psychosocial issues   Evaluate success in meeting personal goal   Describe the belief that they can live successfully with diabetes day to day   Establish 2-3 goals that they will plan to diligently work on until they return for the free 41-month follow-up visit  The following handouts were given in class:  Goal setting handout  Class evaluation form  Low-sodium seasoning tips  Stress management handout  Your patient has established the following 4 month goals for diabetes self-care:  Count carbohydrates at most meals and snacks  Increase physical active  Call smoking cessation hotline  Your patient has identified these potential barriers to change:  Not remembering to count carbs  Being addicted to smoking  Your patient has identified their diabetes self-care support plan as:  [facility] support group  family   Follow-Up Plan: Patient was offered a 4 month follow-up visit for diabetes self-management education.

## 2012-09-15 ENCOUNTER — Encounter (HOSPITAL_COMMUNITY)
Admission: RE | Admit: 2012-09-15 | Discharge: 2012-09-15 | Disposition: A | Discharge status: Home / Self Care | Payer: Medicaid Other | Source: Ambulatory Visit | Attending: Cardiology | Admitting: Cardiology

## 2012-09-15 NOTE — Progress Notes (Signed)
Pt completed 23 exercise sessions in Cardiac Rehab Phase II.  Pt plans to continue exercising with walking.  Pt has made strides toward heart healthy lifestyle.  Pt needs encouragement with complete tobacco cessation.  Pt remarks that she has cut way down but continues to smoke 2-3 cigarettes a day primarily when she feels stressed.  Pt reports to have the ability to make those changes but struggles with following through.  Repeat PHQ2 score -8 which is an improvement from previous screening.  Pt feels the prescribed medications are working for her.  Pt plans to volunteer in the Cardiac Rehab program which she feels will help her mental outlook  tremendously.

## 2012-09-18 ENCOUNTER — Encounter (HOSPITAL_COMMUNITY): Payer: Medicaid Other

## 2012-09-19 ENCOUNTER — Encounter: Payer: Self-pay | Admitting: *Deleted

## 2012-09-19 NOTE — Telephone Encounter (Signed)
Spoke to patient earlier today concerning her cholesterol medication.  Informed her that vyotrin was not approved for usage , needed to switch medication VYTORIN TO  back to Simvastatin per extender.  Patient want to know what  needs to done if medication is the same. Informed her that it will be discussed with Dr Tresa Endo.   It was discussed with Dr Tresa Endo --- per Dr Tresa Endo discontinue vytorin and simvastatin  Start Atrovastatin 20 mg po bedtime.  rx sent .  Left message for patient to return call

## 2012-09-20 ENCOUNTER — Encounter (HOSPITAL_COMMUNITY): Payer: Medicaid Other

## 2012-09-20 MED ORDER — ATORVASTATIN CALCIUM 20 MG PO TABS: 20.0000 mg | ORAL_TABLET | Freq: Every day | ORAL | Status: DC

## 2012-09-20 NOTE — Telephone Encounter (Signed)
SPOKE TO PATIENT. INFORMED HER OF MEDICATION FOR  HER CHOLESTEROL.  Atorvastatin 20 mg daily - escribed to [facility].  Verbalized understanding.

## 2012-09-22 ENCOUNTER — Encounter (HOSPITAL_COMMUNITY): Payer: Medicaid Other

## 2012-09-26 ENCOUNTER — Other Ambulatory Visit: Payer: Self-pay | Admitting: Cardiology

## 2012-09-26 NOTE — Telephone Encounter (Signed)
Rx denied. Refer to primary care for refills.

## 2012-09-27 ENCOUNTER — Telehealth: Payer: Self-pay | Admitting: Cardiovascular Disease

## 2012-09-27 NOTE — Telephone Encounter (Signed)
Informed patient per Dr.kelly okay to volunteer @ [facility]. I will be sending a letter Attention Fabio Pierce to fax number [phone].

## 2012-09-27 NOTE — Telephone Encounter (Signed)
Need a letter stating that it is ok to volunteer in the Cardiac Rehab at [facility] completed the program a week ago.Fax number [phone]-Att:Jessica Juanetta Gosling

## 2012-09-27 NOTE — Telephone Encounter (Signed)
Message forwarded to Dr. Kelly/Wanda, CMA.  

## 2012-10-05 ENCOUNTER — Other Ambulatory Visit: Payer: Self-pay | Admitting: Cardiology

## 2012-10-09 ENCOUNTER — Telehealth: Payer: Self-pay | Admitting: Cardiovascular Disease

## 2012-11-10 ENCOUNTER — Ambulatory Visit: Payer: Medicaid Other | Admitting: Internal Medicine

## 2012-11-14 ENCOUNTER — Encounter: Payer: Self-pay | Admitting: Internal Medicine

## 2012-11-14 ENCOUNTER — Ambulatory Visit (INDEPENDENT_AMBULATORY_CARE_PROVIDER_SITE_OTHER): Payer: Medicaid Other | Admitting: Internal Medicine

## 2012-11-14 VITALS — BP 112/68 | HR 95 | Temp 98.3°F | Resp 12 | Ht 67.5 in | Wt 255.6 lb

## 2012-11-14 DIAGNOSIS — E119 Type 2 diabetes mellitus without complications: Secondary | ICD-10-CM

## 2012-11-14 LAB — HEMOGLOBIN A1C: Hgb A1c MFr Bld: 6.6 % — ABNORMAL HIGH (ref 4.6–6.5)

## 2012-11-14 MED ORDER — INSULIN ASPART 100 UNIT/ML ~~LOC~~ SOLN: SUBCUTANEOUS | Status: DC

## 2012-11-14 MED ORDER — INSULIN DETEMIR 100 UNIT/ML ~~LOC~~ SOLN: 50.0000 [IU] | Freq: Every day | SUBCUTANEOUS | Status: DC

## 2012-11-14 NOTE — Progress Notes (Signed)
Patient ID: Cassidy [name], female   DOB: 06/02/1965, 47 y.o.   MRN: [id number]  HPI: Cassidy [name] is a 47 y.o.-year-old female, referred by her PCP, Pricilla Loveless, for management of DM2, insulin-dependent, uncontrolled, with complications (CAD, peripheral neuropathy).  Patient has been diagnosed with diabetes in 2005; she started insulin in 05/2012. Last hemoglobin A1c was: Lab Results  Component Value Date   HGBA1C 6.8* 08/07/2012   HGBA1C 10.2* 05/26/2012   HGBA1C 10.5* 09/16/2011   Pt is on a regimen of: - Levemir 35 units bid - Novolog 8 units tid ac Was on Metformin >> diarrhea.   Pt checks her sugars 3-4 a day and they are: - am: 102-131 - 2h after b'fast: 79-139 - before lunch: 111-167 - 2h after lunch: not checking - before dinner: 103-126 - 2h after dinner:120-212 - bedtime:147-223 - nighttime: not checking AccuChek.  No lows. Lowest sugar was 74; she has hypoglycemia awareness at 90s. Highest sugar was 223.  Pt's meals are: - Breakfast: glass of skim milk, [place] sausage (not bacon anymore), grits - 1 cup, toast - Lunch: salads and soups (Healthy Choice) - Dinner: pinto beans, casseroles, [place] and chicken meat; spaghetti Walks after dinner. - Snacks: baby carrots No red meats, no chips anymore, no fried foods She walks 3x a week 1 mile, but cannot walk more b/c neuropathic pain.   - no CKD, last BUN/creatinine:  Lab Results  Component Value Date   BUN 12 08/07/2012   CREATININE 0.81 08/07/2012  She is on Lisinopril 10.  - last set of lipids: Lab Results  Component Value Date   CHOL 176 05/28/2012   HDL 38* 05/28/2012   LDLCALC 75 08/07/2012   TRIG 73 08/07/2012   CHOLHDL 4.6 05/28/2012  She is on Lipitor 20. She is on ASA 81. - last eye exam was in 09/2012. 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 Dr Mitzi Davenport.  - + numbness and tingling in her feet >> now on Neurontin 600 mg bid ([facility] would not approve more).   I reviewed her chart and she also has a  history of bipolar disorder, HL, GERD, back pain >> seeking disability.  Pt has FH of DM in [place].   ROS: Constitutional: + weight gain, + fatigue, + hot flashes Eyes: + blurry vision, no xerophthalmia ENT: no sore throat, no nodules palpated in throat, no dysphagia/odynophagia, no hoarseness; + tinnitus Cardiovascular: no CP/SOB/palpitations/+ leg swelling Respiratory: no cough/SOB/+ wheezing Gastrointestinal: no N/V/D/+C Musculoskeletal: no muscle/joint aches Skin: no rashes, +easy  bruising Neurological: no tremors/numbness/tingling/dizziness Psychiatric: + depression/no anxiety  Past Medical History  Diagnosis Date  . Hypertension   . GERD (gastroesophageal reflux disease)   . Breast abscess     recurrent; on right  . High cholesterol   . Type II diabetes mellitus   . Anemia   . Sickle cell trait   . Fibromyalgia   . Genital warts   . Constipation   . Diabetic peripheral neuropathy   . Depression   . Bipolar depression   . PTSD (post-traumatic stress disorder)   . Complication of anesthesia     deep pain in legs and arms for 3 days post anesthesia   . Asthma     seasonal  . Tooth caries     pt. states she will have an extraction in 5/14 on tooth on bottom right  . Pain in limb     LEA VENOUS DUPLEX, 02/05/2009 - no evidence of deep vein  thrombosis, Baker's cyst  . Chest pain     2D ECHO, 02/05/2009 - EF >55%,   . CAD (coronary artery disease) 05/2012    angiosculpt cutting balloon of distal RCA and ostium of branch extending into the PLA vessel and PDA branch. residual disease of LAD and LCX non obstructive, EF 60-65%   Past Surgical History  Procedure Laterality Date  . Bladder surgery  1980    "TVT"  . Irrigation and debridement abscess  12/19/2010    Procedure: IRRIGATION AND DEBRIDEMENT ABSCESS;  Surgeon: Emelia Loron, MD;  Location[facility] OR;  Service: General;  Laterality: Right;  . Incise and drain abcess  ; 04/26/11; 08/13/11    right breast  . Tonsillectomy   1988  . Refractive surgery  ~ 2010    right  . Irrigation and debridement abscess  08/13/2011    Procedure: MINOR INCISION AND DRAINAGE OF ABSCESS;  Surgeon: Cherylynn Ridges, MD;  Location: [facility] OR;  Service: General;  Laterality: Right;  Right Breast   . Irrigation and debridement abscess  11/17/2011    Procedure: IRRIGATION AND DEBRIDEMENT ABSCESS;  Surgeon: Wilmon Arms. Corliss Skains, MD;  Location: [facility] OR;  Service: General;  Laterality: Right;  irrigation and debridement right recurrent breast abscess  . Breast surgery Right     I&D for multiple abscesses  . Eye surgery Left     laser surgery  . Larynx surgery    . Ployp removed from voice box 03/30/12    . Incision and drainage abscess Right 05/09/2012    Procedure: INCISION AND DRAINAGE RIGHT BREAST ABSCESS;  Surgeon: Wilmon Arms. Corliss Skains, MD;  Location: [facility] OR;  Service: General;  Laterality: Right;  . Nm myoview ltd  01/26/2009    Normal study, no evidence of ischemia, EF 67%  . Cutting balloon    . Cardiac catheterization  05/2012    see medical Hx.   History   Social History  . Marital Status: Single    Spouse Name: N/A    Number of Children: 1   Occupational History  . Not on file.   Social History Main Topics  . Smoking status: Former Smoker -- 0.25 packs/day for 30 years    Types: Cigarettes    Quit date: 03/11/2012  . Smokeless tobacco: Never Used     Comment: 08/14/11 "quit for 3 wk 05/2011; was smoking 1 ppd since age 73 before I quit; at least I've cut down to 1/4 ppd"  . 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 Drug Use: No     Comment: 08/14/11 'quit everything in 2005"  . Sexual Activity: Not Currently   Other Topics Concern  . Not on file   Social History Narrative   Work: disability   Children: son, 75 yrs old   Regular exercise: some/ heart attack in May/ walks 1 mile 3 days a week   Caffeine use: daily, cup of coffee daily   Current Outpatient Prescriptions on  File Prior to Visit  Medication Sig Dispense Refill  . acyclovir (ZOVIRAX) 200 MG capsule Take by mouth 2 (two) times daily.      Marland Kitchen albuterol (PROVENTIL HFA;VENTOLIN HFA) 108 (90 BASE) MCG/ACT inhaler Inhale 2 puffs into the lungs every 6 (six) hours as needed for wheezing.      Marland Kitchen aspirin 81 MG chewable tablet Chew 162 mg by mouth daily.      Marland Kitchen atorvastatin (LIPITOR) 20 MG  tablet Take 1 tablet (20 mg total) by mouth daily.  30 tablet  6  . carvedilol (COREG) 6.25 MG tablet Take 1 tablet (6.25 mg total) by mouth 2 (two) times daily.  60 tablet  6  . clopidogrel (PLAVIX) 75 MG tablet Take 1 tablet (75 mg total) by mouth daily.  30 tablet  6  . Cyanocobalamin (B-12 PO) Take by mouth daily.      . fesoterodine (TOVIAZ) 4 MG TB24 Take 4 mg by mouth daily.      . fluticasone (VERAMYST) 27.5 MCG/SPRAY nasal spray Place 2 sprays into the nose daily as needed.       . gabapentin (NEURONTIN) 300 MG capsule Take 600 mg by mouth 2 (two) times daily.       . insulin aspart (NOVOLOG) 100 UNIT/ML injection Inject 8 Units into the skin 3 (three) times daily before meals.  1 vial  1  . insulin detemir (LEVEMIR) 100 UNIT/ML injection Inject 35 Units into the skin 2 (two) times daily.      Marland Kitchen lisinopril (PRINIVIL,ZESTRIL) 10 MG tablet Take 10 mg by mouth daily.      Marland Kitchen omeprazole (PRILOSEC) 20 MG capsule Take 20 mg by mouth daily.      Marland Kitchen PARoxetine (PAXIL-CR) 37.5 MG 24 hr tablet Take 37.5 mg by mouth daily.       . pentosan polysulfate (ELMIRON) 100 MG capsule Take 100 mg by mouth daily as needed. PRN per patient      . buPROPion (WELLBUTRIN XL) 150 MG 24 hr tablet Take 150 mg by mouth daily.      . divalproex (DEPAKOTE ER) 500 MG 24 hr tablet Take 500 mg by mouth at bedtime.       . fluconazole (DIFLUCAN) 150 MG tablet Take 1 tablet (150 mg total) by mouth once.  1 tablet  4  . tinidazole (TINDAMAX) 500 MG tablet Take 1 tablet (500 mg total) by mouth daily with breakfast.  10 tablet  2   No current  facility-administered medications on file prior to visit.   No Known Allergies Family History  Problem Relation Age of Onset  . COPD Mother   . Cancer Mother     lymphoma  . Heart disease Mother   . Cancer Maternal Aunt     breast, colon  . Hyperlipidemia Father   . Heart disease Father   . Hypertension Brother   . Hypertension Maternal Grandmother   . Diabetes Maternal Grandmother    PE: BP 112/68  Pulse 95  Temp(Src) 98.3 F (36.8 C) (Oral)  Resp 12  Ht 5' 7.5" (1.715 m)  Wt 255 lb 9.6 oz (115.939 kg)  BMI 39.42 kg/m2  SpO2 98% Wt Readings from Last 3 Encounters:  11/14/12 255 lb 9.6 oz (115.939 kg)  08/01/12 247 lb 12.8 oz (112.401 kg)  07/20/12 247 lb (112.038 kg)   Constitutional: overweight, in NAD Eyes: PERRLA, EOMI, no exophthalmos ENT: moist mucous membranes, no thyromegaly, no cervical lymphadenopathy Cardiovascular: RRR, No MRG Respiratory: CTA B Gastrointestinal: abdomen soft, NT, ND, BS+ Musculoskeletal: no deformities, strength intact in all 4 Skin: moist, warm, no rashes Neurological: no tremor with outstretched hands, DTR normal in all 4  ASSESSMENT: 1. DM2, insulin-dependent, uncontrolled, with complications - CAD - s/p AMI 05/2012 Had normal lower arterial duplex study 08/03/2012. - peripheral neuropathy (on Neurontin)  PLAN:  1. Patient with long-standing, recently more controlled diabetes after starting basal-bolus insulin regimen. She also started to change her diet  and is doing a great job with this. - We discussed about options for treatment, and I suggested to:  Patient Instructions  Please decrease Levemir to 50 units and take it at bedtime. Please start Metformin extended release 500 mg with dinner x 4 days. If you tolerate this well, add another Metformin tablet (500 mg) with breakfast x 4 days. If you tolerate this well, add another metformin tablet with dinner (total 1000 mg) x 4 days. If you tolerate this well, add another metformin  tablets with breakfast (total 1000 mg). Continue with 1000 mg of metformin twice a day with breakfast and dinner. Please let me know if you tolerate it and I will send some to your pharmacy. Increase dinnertime Novolog to 10 units. Use the following Sliding scale NovoLog nsulin: - 150-175: + 1 unit  - 176-200: + 2 units  - 201-225: + 3 units  - 226-250: + 4 units  - >250: + 5 units - given sample of Glumetza - Strongly advised her to start checking sugars at different times of the day - check 3x times a day, rotating checks - given sugar log and advised how to fill it and to bring it at next appt  - given foot care handout and explained the principles  - given instructions for hypoglycemia management "15-15 rule"  - advised for yearly eye exams - Return to clinic in 1 mo with sugar log   Office Visit on 11/14/2012  Component Date Value Range Status  . Hemoglobin A1C 11/14/2012 6.6* 4.6 - 6.5 % Final   Glycemic Control Guidelines for People with Diabetes:Non Diabetic:  <6%Goal of Therapy: <7%Additional Action Suggested:  >8%    Excellent A1c >> continue with plan above, hopefully we can reduce insulin further at next visit.

## 2012-11-14 NOTE — Patient Instructions (Addendum)
Please return in 1 month with your sugar log.  Please stop at the lab.  Please decrease Levemir to 50 units and take it at bedtime. Please start Metformin extended release 500 mg with dinner x 4 days. If you tolerate this well, add another Metformin tablet (500 mg) with breakfast x 4 days. If you tolerate this well, add another metformin tablet with dinner (total 1000 mg) x 4 days. If you tolerate this well, add another metformin tablets with breakfast (total 1000 mg). Continue with 1000 mg of metformin twice a day with breakfast and dinner. Please let me know if you tolerate it and I will send some to your pharmacy. Increase dinnertime Novolog to 10 units. Use the following Sliding scale insulin: - 150-175: + 1 unit  - 176-200: + 2 units  - 201-225: + 3 units  - 226-250: + 4 units  - >250: + 5 units      PATIENT INSTRUCTIONS FOR TYPE 2 DIABETES:  **Please join MyChart!** - see attached instructions about how to join   DIET AND EXERCISE Diet and exercise is an important part of diabetic treatment.  We recommended aerobic exercise in the form of brisk walking (working between 40-60% of maximal aerobic capacity, similar to brisk walking) for 150 minutes per week (such as 30 minutes five days per week) along with 3 times per week performing 'resistance' training (using various gauge rubber tubes with handles) 5-10 exercises involving the major muscle groups (upper body, lower body and core) performing 10-15 repetitions (or near fatigue) each exercise. Start at half the above goal but build slowly to reach the above goals. If limited by weight, joint pain, or disability, we recommend daily walking in a swimming pool with water up to waist to reduce pressure from joints while allow for adequate exercise.    BLOOD GLUCOSES Monitoring your blood glucoses is important for continued management of your diabetes. Please check your blood glucoses 2-4 times a day: fasting, before meals and at bedtime  (you can rotate these measurements - e.g. one day check before the 3 meals, the next day check before 2 of the meals and before bedtime, etc.   HYPOGLYCEMIA (low blood sugar) Hypoglycemia is usually a reaction to not eating, exercising, or taking too much insulin/ other diabetes drugs.  Symptoms include tremors, sweating, hunger, confusion, headache, etc. Treat IMMEDIATELY with 15 grams of Carbs:   4 glucose tablets    cup regular juice/soda   2 tablespoons raisins   4 teaspoons sugar   1 tablespoon honey Recheck blood glucose in 15 mins and repeat above if still symptomatic/blood glucose <100. Please contact our office at [phone] if you have questions about how to next handle your insulin.  RECOMMENDATIONS TO REDUCE YOUR RISK OF DIABETIC COMPLICATIONS: * Take your prescribed MEDICATION(S). * Follow a DIABETIC diet: Complex carbs, fiber rich foods, heart healthy fish twice weekly, (monounsaturated and polyunsaturated) fats * AVOID saturated/trans fats, high fat foods, >2,300 mg salt per day. * EXERCISE at least 5 times a week for 30 minutes or preferably daily.  * DO NOT SMOKE OR DRINK more than 1 drink a day. * Check your FEET every day. Do not wear tightfitting shoes. Contact us if you develop an ulcer * See your EYE doctor once a year or more if needed * Get a FLU shot once a year * Get a PNEUMONIA vaccine once before and once after age 63 years  GOALS:  * Your Hemoglobin A1c of <7%  *  fasting sugars need to be <130 * after meals sugars need to be <180 (2h after you start eating) * Your Systolic BP should be 140 or lower  * Your Diastolic BP should be 80 or lower  * Your HDL (Good Cholesterol) should be 40 or higher  * Your LDL (Bad Cholesterol) should be 100 or lower  * Your Triglycerides should be 150 or lower  * Your Urine microalbumin (kidney function) should be <30 * Your Body Mass Index should be 25 or lower   We will be glad to help you achieve these goals. Our  telephone number is: [phone].

## 2012-11-16 ENCOUNTER — Other Ambulatory Visit: Payer: Self-pay

## 2012-11-28 ENCOUNTER — Other Ambulatory Visit: Payer: Self-pay | Admitting: *Deleted

## 2012-11-28 MED ORDER — "INSULIN SYRINGE-NEEDLE U-100 30G X 5/16"" 1 ML MISC": Status: DC

## 2012-11-28 NOTE — Telephone Encounter (Signed)
Pt called requesting refill for her insulin syringes to be sent to [facility] at [address]

## 2012-12-13 ENCOUNTER — Other Ambulatory Visit: Payer: Self-pay | Admitting: *Deleted

## 2012-12-13 ENCOUNTER — Telehealth: Payer: Self-pay | Admitting: *Deleted

## 2012-12-13 MED ORDER — GLUMETZA 1000 MG PO TB24: 1000.0000 mg | ORAL_TABLET | Freq: Two times a day (BID) | ORAL | Status: DC

## 2012-12-13 NOTE — Telephone Encounter (Signed)
Cassidy [name], let's send Glumetza 1000 mg (DAW) 60 tabs with 2 refills. Take 1000 mg po bid.

## 2012-12-13 NOTE — Telephone Encounter (Signed)
Pt called and lvm stating that she is doing well on the [facility] and asked for an rx to be called in to [facility] on [address], please. Please advise, she had samples and I do not know the dosage. Thank you.

## 2013-01-15 ENCOUNTER — Encounter: Payer: Medicaid Other | Admitting: *Deleted

## 2013-02-09 ENCOUNTER — Ambulatory Visit: Payer: Medicaid Other | Admitting: Internal Medicine

## 2013-02-12 ENCOUNTER — Ambulatory Visit: Payer: Medicaid Other | Admitting: Internal Medicine

## 2013-02-19 ENCOUNTER — Ambulatory Visit: Payer: Medicaid Other | Admitting: Internal Medicine

## 2013-03-09 ENCOUNTER — Encounter: Payer: Self-pay | Admitting: Internal Medicine

## 2013-03-09 ENCOUNTER — Other Ambulatory Visit: Payer: Self-pay | Admitting: Internal Medicine

## 2013-03-09 ENCOUNTER — Ambulatory Visit (INDEPENDENT_AMBULATORY_CARE_PROVIDER_SITE_OTHER): Payer: Medicaid Other | Admitting: Internal Medicine

## 2013-03-09 ENCOUNTER — Other Ambulatory Visit: Payer: Self-pay | Admitting: Cardiology

## 2013-03-09 VITALS — BP 122/72 | HR 99 | Temp 98.6°F | Resp 16 | Wt 248.0 lb

## 2013-03-09 DIAGNOSIS — E1149 Type 2 diabetes mellitus with other diabetic neurological complication: Secondary | ICD-10-CM

## 2013-03-09 LAB — HEMOGLOBIN A1C
HEMOGLOBIN A1C: 6.7 % — AB (ref <5.7)
MEAN PLASMA GLUCOSE: 146 mg/dL — AB (ref <117)

## 2013-03-09 MED ORDER — METFORMIN HCL ER 500 MG PO TB24
500.0000 mg | ORAL_TABLET | Freq: Every day | ORAL | Status: DC
Start: 2013-03-09 — End: 2013-05-24

## 2013-03-09 NOTE — Patient Instructions (Addendum)
Please add Metformin XR 500 mg with dinner x 4 days. If you tolerate this well, add another Metformin tablet (500 mg) with breakfast x 4 days. If you tolerate this well, add another metformin tablet with dinner (total 1000 mg) x 4 days. If you tolerate this well, add another metformin tablets with breakfast (total 1000 mg). Continue with 1000 mg of metformin twice a day with breakfast and dinner. If you tolerate Metformin well , decrease Levemir to 35 units once a day at bedtime. Stay on the current NovoLog regimen for now.  Please return in 1.5  month with your sugar log.   Please stop at [facility] lab downstairs.

## 2013-03-09 NOTE — Telephone Encounter (Signed)
Rx was sent to pharmacy electronically. 

## 2013-03-09 NOTE — Progress Notes (Signed)
Patient ID: Cassidy [name], female   DOB: [birth year], 48 y.o.   MRN: [id number]  HPI: Cassidy [name] is a 48 y.o.-year-old female, initially referred by her PCP, Dellie Catholic, for management of DM2, dx 2005, insulin-dependent since 05/2012, uncontrolled, with complications (CAD, peripheral neuropathy). Last visit 3 mo ago.  Last hemoglobin A1c was: Lab Results  Component Value Date   HGBA1C 6.6* 11/14/2012   HGBA1C 6.8* 08/07/2012   HGBA1C 10.2* 05/26/2012   Pt is on a regimen of: - Levemir 35 units bid >> 50 units daily - Novolog 8 units tid ac >> 08-18-08 - NovoLog SSI: - 150-175: + 1 unit  - 176-200: + 2 units  - 201-225: + 3 units  - 226-250: + 4 units  - >250: + 5 units Was on Metformin >> diarrhea.  - She finished the [place] and could not get it from the pharmacy yet... Unclear if she can afford.  Pt checks her sugars 1-3 a day and they are: - am: 102-131 >> 98-129 (highest 140 x 1)  - 2h after b'fast: 79-139 >> n/c - before lunch: 111-167 >> 88, 92, 98, 146 - 2h after lunch: not checking - before dinner: 103-126 >> 102-113 - 2h after dinner:120-212 >> 124, 139 - bedtime:147-223 >>  171, 178, 202, 274 x 1 - nighttime: not checking AccuChek.  No lows. Lowest sugar was 74; she has hypoglycemia awareness at 90s. Highest sugar was 223.  Pt's meals are: - Breakfast: glass of skim milk, [place] sausage (not bacon anymore), grits - 1 cup, toast - Lunch: salads and soups ([employer]) - Dinner: pinto beans, casseroles, [place] and chicken meat; spaghetti Walks after dinner. - Snacks: baby carrots, jello No red meats, no chips anymore, no fried foods.  - no CKD, last BUN/creatinine:  Lab Results  Component Value Date   BUN 12 08/07/2012   CREATININE 0.81 08/07/2012  She is on Lisinopril 10.  - last set of lipids: Lab Results  Component Value Date   CHOL 176 05/28/2012   HDL 38* 05/28/2012   LDLCALC 75 08/07/2012   TRIG 73 08/07/2012   CHOLHDL 4.6 05/28/2012  She  is on Lipitor 20. She is on ASA 81. - last eye exam was in 09/2012. 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 Dr Ricki Miller.  - + numbness and tingling in her feet >> was on Neurontin 600 mg bid ([employer] would not approve more) >> now on Neurontin 300 bid + Lyrica 75 bid >> feels much better, not having SEs.   I reviewed her chart and she also has a history of bipolar disorder, HL, GERD, back pain >> seeking disability.  She was started on HCTZ since last visit.  She is walking 2 miles 3x a week, since neuropathic pain better!  ROS: Constitutional: + weight loss, + decreased appetite, no more fatigue, + hot flashes, + insomnia, + nocturia  Eyes: no blurry vision, no xerophthalmia ENT: no sore throat, no nodules palpated in throat, no dysphagia/odynophagia, no hoarseness; + tinnitus Cardiovascular: no CP/+ SOB/no palpitations/improved leg swelling since started HCTZ Respiratory: no cough/+ SOB/no wheezing Gastrointestinal: no N/V/D/+C Musculoskeletal: no muscle/+ joint aches Skin: no rashes, + easy  bruising Neurological: no tremors/numbness/tingling/dizziness Low libido  I reviewed pt's medications, allergies, PMH, social hx, family hx and no changes required, except as mentioned above. Also started Trazodone.   PE: BP 122/72  Pulse 99  Temp(Src) 98.6 F (37 C) (Oral)  Resp 16  Wt 248 lb (  112.492 kg)  SpO2 98% Wt Readings from Last 3 Encounters:  03/09/13 248 lb (112.492 kg)  11/14/12 255 lb 9.6 oz (115.939 kg)  08/01/12 247 lb 12.8 oz (112.401 kg)   Constitutional: overweight, in NAD Eyes: PERRLA, EOMI, no exophthalmos ENT: moist mucous membranes, no thyromegaly, no cervical lymphadenopathy Cardiovascular: RRR, No MRG Respiratory: CTA B Gastrointestinal: abdomen soft, NT, ND, BS+ Musculoskeletal: no deformities, strength intact in all 4 Skin: moist, warm, no rashes  ASSESSMENT: 1. DM2, insulin-dependent, uncontrolled, with complications - CAD - s/p AMI 05/2012 Had normal  lower arterial duplex study 08/03/2012. - peripheral neuropathy (on Neurontin)  PLAN:  1. Patient with long-standing, recently more controlled diabetes after starting basal-bolus insulin regimen. She also started to change her diet and is doing a great job with this >> lost 8 lbs since last visit. - We discussed about options for treatment, and I suggested to:    Patient Instructions  Please add Metformin XR 500 mg with dinner x 4 days. If you tolerate this well, add another Metformin tablet (500 mg) with breakfast x 4 days. If you tolerate this well, add another metformin tablet with dinner (total 1000 mg) x 4 days. If you tolerate this well, add another metformin tablets with breakfast (total 1000 mg). Continue with 1000 mg of metformin twice a day with breakfast and dinner.  If you tolerate Metformin well , decrease Levemir to 35 units once a day at bedtime.  Stay on the current NovoLog regimen for now.  Please return in 1.5  month with your sugar log.   Please stop at [facility] lab downstairs. - at next visit, if sugars still good and she is on Metformin, can try Victoza instead of mealtime insulin (at least for the first 2 meals of the day). She has been on Byetta in the past. - continue checking CBGs 3x times a day, rotating checks - given new sugar log - up to date with yearly eye exams - check A1c today. - Return to clinic in 1.5 mo with sugar log    Orders Only on 03/09/2013  Component Date Value Ref Range Status  . Hemoglobin A1C 03/09/2013 6.7* <5.7 % Final   Comment:                                                                                                 According to the ADA Clinical Practice Recommendations for 2011, when                          HbA1c is used as a screening test:                                                       >=6.5%   Diagnostic of Diabetes Mellitus                                     (  if abnormal result is confirmed)                                                      5.7-6.4%   Increased risk of developing Diabetes Mellitus                                                     References:Diagnosis and Classification of Diabetes Mellitus,Diabetes                          [id number],41(CVUDT 1):S62-S69 and Standards of Medical Care in                                  Diabetes - 2011,Diabetes [id number],8875,79 (Suppl 1):S11-S61.                             . Mean Plasma Glucose 03/09/2013 146* <117 mg/dL Final   Excellent A1c.

## 2013-03-13 ENCOUNTER — Telehealth: Payer: Self-pay | Admitting: *Deleted

## 2013-03-13 NOTE — Telephone Encounter (Signed)
Message copied by Lucius Conn on Tue Mar 13, 2013  9:59 AM ------      Message from: JEFFRIES, York Spaniel      Created: Tue Mar 13, 2013  9:34 AM      Contact: [phone]       Wants to know her A1C  ------

## 2013-03-13 NOTE — Telephone Encounter (Signed)
Called pt and advised her that her A1c is 6.7. Excellent. Pt pleased.

## 2013-03-13 NOTE — Telephone Encounter (Signed)
Please tell her to check labs in [place]. I sent it to her 2 days ago.

## 2013-03-13 NOTE — Telephone Encounter (Signed)
Pt called requesting lab results. Please advise.  

## 2013-03-22 ENCOUNTER — Other Ambulatory Visit: Payer: Self-pay

## 2013-03-22 DIAGNOSIS — Z1231 Encounter for screening mammogram for malignant neoplasm of breast: Secondary | ICD-10-CM

## 2013-04-04 ENCOUNTER — Other Ambulatory Visit: Payer: Self-pay | Admitting: Cardiovascular Disease

## 2013-04-04 ENCOUNTER — Other Ambulatory Visit: Payer: Self-pay | Admitting: Cardiology

## 2013-04-04 NOTE — Telephone Encounter (Signed)
Rx was sent to pharmacy electronically. 

## 2013-04-06 ENCOUNTER — Telehealth: Payer: Self-pay | Admitting: *Deleted

## 2013-04-06 ENCOUNTER — Telehealth: Payer: Self-pay | Admitting: Internal Medicine

## 2013-04-06 ENCOUNTER — Other Ambulatory Visit: Payer: Self-pay | Admitting: *Deleted

## 2013-04-06 ENCOUNTER — Ambulatory Visit
Admission: RE | Admit: 2013-04-06 | Discharge: 2013-04-06 | Disposition: A | Discharge status: Home / Self Care | Payer: Medicaid Other | Source: Ambulatory Visit

## 2013-04-06 DIAGNOSIS — Z1231 Encounter for screening mammogram for malignant neoplasm of breast: Secondary | ICD-10-CM

## 2013-04-06 MED ORDER — INSULIN DETEMIR 100 UNIT/ML ~~LOC~~ SOLN: 50.0000 [IU] | Freq: Every day | SUBCUTANEOUS | Status: DC

## 2013-04-06 MED ORDER — GABAPENTIN 600 MG PO TABS: 600.0000 mg | ORAL_TABLET | Freq: Two times a day (BID) | ORAL | Status: DC

## 2013-04-06 NOTE — Telephone Encounter (Signed)
Pt called and she states she is having an issue with her Gabapentin Rx. Her insurance (Medicaid) will not approve the qty amount of 120. Medicaid will only cover for 30 tablets. Please advise.

## 2013-04-06 NOTE — Telephone Encounter (Signed)
Pt states she is having an issue with her Gabapentin Rx  Her insurance will not approve the qty amount of 120 Pt says please lower to 30  Also pt says she has been without levemir for a couple days and would like a refill    Please advise  Call [phone]    Thank You :)

## 2013-04-06 NOTE — Telephone Encounter (Signed)
I am not sure why this is happening. Let's send 600 mg tabs - bid >> 60 tabs a month.

## 2013-04-09 NOTE — Telephone Encounter (Signed)
New rx sent to her pharmacy.

## 2013-04-10 ENCOUNTER — Other Ambulatory Visit: Payer: Self-pay | Admitting: Internal Medicine

## 2013-04-23 ENCOUNTER — Ambulatory Visit: Payer: Medicaid Other | Admitting: Internal Medicine

## 2013-04-26 ENCOUNTER — Telehealth: Payer: Self-pay | Admitting: Internal Medicine

## 2013-04-26 NOTE — Telephone Encounter (Signed)
Pt would like to let Larene Beach know that there will be a fax sent over to from [place] :)

## 2013-05-02 NOTE — Telephone Encounter (Signed)
Encounter has been closed--TP 05/03/13

## 2013-05-07 ENCOUNTER — Ambulatory Visit: Payer: Medicaid Other | Admitting: Internal Medicine

## 2013-05-09 ENCOUNTER — Other Ambulatory Visit: Payer: Self-pay | Admitting: Cardiovascular Disease

## 2013-05-22 NOTE — Telephone Encounter (Signed)
Close encounter 

## 2013-05-24 ENCOUNTER — Other Ambulatory Visit: Payer: Self-pay | Admitting: Internal Medicine

## 2013-05-28 ENCOUNTER — Other Ambulatory Visit: Payer: Self-pay | Admitting: Cardiovascular Disease

## 2013-05-28 NOTE — Telephone Encounter (Signed)
Rx refill sent to patient pharmacy   

## 2013-06-20 ENCOUNTER — Encounter (HOSPITAL_COMMUNITY): Payer: Self-pay | Admitting: Emergency Medicine

## 2013-06-20 ENCOUNTER — Emergency Department (HOSPITAL_COMMUNITY)
Admission: EM | Admit: 2013-06-20 | Discharge: 2013-06-20 | Disposition: A | Discharge status: Home / Self Care | Payer: Medicaid Other | Attending: Emergency Medicine | Admitting: Emergency Medicine

## 2013-06-20 DIAGNOSIS — Z9889 Other specified postprocedural states: Secondary | ICD-10-CM | POA: Insufficient documentation

## 2013-06-20 DIAGNOSIS — IMO0001 Reserved for inherently not codable concepts without codable children: Secondary | ICD-10-CM

## 2013-06-20 DIAGNOSIS — F4310 Post-traumatic stress disorder, unspecified: Secondary | ICD-10-CM | POA: Insufficient documentation

## 2013-06-20 DIAGNOSIS — Z8742 Personal history of other diseases of the female genital tract: Secondary | ICD-10-CM | POA: Insufficient documentation

## 2013-06-20 DIAGNOSIS — J45909 Unspecified asthma, uncomplicated: Secondary | ICD-10-CM | POA: Insufficient documentation

## 2013-06-20 DIAGNOSIS — Z87891 Personal history of nicotine dependence: Secondary | ICD-10-CM | POA: Insufficient documentation

## 2013-06-20 DIAGNOSIS — Z794 Long term (current) use of insulin: Secondary | ICD-10-CM | POA: Insufficient documentation

## 2013-06-20 DIAGNOSIS — Z79899 Other long term (current) drug therapy: Secondary | ICD-10-CM | POA: Insufficient documentation

## 2013-06-20 DIAGNOSIS — K219 Gastro-esophageal reflux disease without esophagitis: Secondary | ICD-10-CM | POA: Insufficient documentation

## 2013-06-20 DIAGNOSIS — I10 Essential (primary) hypertension: Secondary | ICD-10-CM | POA: Insufficient documentation

## 2013-06-20 DIAGNOSIS — F3130 Bipolar disorder, current episode depressed, mild or moderate severity, unspecified: Secondary | ICD-10-CM | POA: Insufficient documentation

## 2013-06-20 DIAGNOSIS — Z872 Personal history of diseases of the skin and subcutaneous tissue: Secondary | ICD-10-CM | POA: Insufficient documentation

## 2013-06-20 DIAGNOSIS — IMO0002 Reserved for concepts with insufficient information to code with codable children: Secondary | ICD-10-CM | POA: Insufficient documentation

## 2013-06-20 DIAGNOSIS — Z8669 Personal history of other diseases of the nervous system and sense organs: Secondary | ICD-10-CM | POA: Insufficient documentation

## 2013-06-20 DIAGNOSIS — I2510 Atherosclerotic heart disease of native coronary artery without angina pectoris: Secondary | ICD-10-CM | POA: Insufficient documentation

## 2013-06-20 DIAGNOSIS — Z8739 Personal history of other diseases of the musculoskeletal system and connective tissue: Secondary | ICD-10-CM | POA: Insufficient documentation

## 2013-06-20 DIAGNOSIS — Z7902 Long term (current) use of antithrombotics/antiplatelets: Secondary | ICD-10-CM | POA: Insufficient documentation

## 2013-06-20 DIAGNOSIS — E7800 Pure hypercholesterolemia, unspecified: Secondary | ICD-10-CM | POA: Insufficient documentation

## 2013-06-20 DIAGNOSIS — Z7982 Long term (current) use of aspirin: Secondary | ICD-10-CM | POA: Insufficient documentation

## 2013-06-20 DIAGNOSIS — L03019 Cellulitis of unspecified finger: Secondary | ICD-10-CM | POA: Insufficient documentation

## 2013-06-20 DIAGNOSIS — D649 Anemia, unspecified: Secondary | ICD-10-CM | POA: Insufficient documentation

## 2013-06-20 DIAGNOSIS — E119 Type 2 diabetes mellitus without complications: Secondary | ICD-10-CM | POA: Insufficient documentation

## 2013-06-20 MED ORDER — HYDROCODONE-ACETAMINOPHEN 5-325 MG PO TABS: 1.0000 | ORAL_TABLET | ORAL | Status: DC | PRN

## 2013-06-20 MED ORDER — FLUCONAZOLE 150 MG PO TABS: 150.0000 mg | ORAL_TABLET | Freq: Once | ORAL | Status: DC

## 2013-06-20 MED ORDER — HYDROCODONE-ACETAMINOPHEN 5-325 MG PO TABS
1.0000 | ORAL_TABLET | Freq: Once | ORAL | Status: AC
  Administered 2013-06-20: 1 via ORAL
  Filled 2013-06-20: qty 1

## 2013-06-20 MED ORDER — SULFAMETHOXAZOLE-TRIMETHOPRIM 800-160 MG PO TABS: 1.0000 | ORAL_TABLET | Freq: Two times a day (BID) | ORAL | Status: DC

## 2013-06-20 NOTE — ED Notes (Signed)
Pt presents to department for evaluation of swelling and pain to 3rd finger. States increased pain and bloody drainage. No relief with treatments and soaks at home. 8/10 pain at the time. Pt is alert and oriented x4.

## 2013-06-20 NOTE — ED Provider Notes (Signed)
Medical screening examination/treatment/procedure(s) were performed by non-physician practitioner and as supervising physician I was immediately available for consultation/collaboration.   Hoy Morn, MD 06/20/13 [phone]

## 2013-06-20 NOTE — ED Provider Notes (Signed)
CSN: [id number]     Arrival date & time 06/20/13  1249 History  This chart was scribed for non-physician practitioner, Kathryne Hitch, working with Hoy Morn, MD by Roe Coombs, ED Scribe. This patient was seen in room TR11C/TR11C and the patient's care was started at 2:11 PM.    Chief Complaint  Patient presents with  . Hand Pain    The history is provided by the patient. No language interpreter was used.    HPI Comments: Cassidy [name] is a 48 y.o. female who presents to the Emergency Department complaining of constant left third finger pain that onset when she was removing artificial fingernails herself yesterday.  She tore her skin and the laceration on her left middle finger has been oozing pus. There is associated swelling on the tip of the finger. Pain is exacerbated with range of motion of fingers.  Patient has a history of diabetes. She reports that her blood sugar was 184 this morning. Patient denies numbness or weakness of extremities, fever, chills, headaches, nausea, vomiting.  Past Medical History  Diagnosis Date  . Hypertension   . GERD (gastroesophageal reflux disease)   . Breast abscess     recurrent; on right  . High cholesterol   . Type II diabetes mellitus   . Anemia   . Sickle cell trait   . Fibromyalgia   . Genital warts   . Constipation   . Diabetic peripheral neuropathy   . Depression   . Bipolar depression   . PTSD (post-traumatic stress disorder)   . Complication of anesthesia     deep pain in legs and arms for 3 days post anesthesia   . Asthma     seasonal  . Tooth caries     pt. states she will have an extraction in 5/14 on tooth on bottom right  . Pain in limb     LEA VENOUS DUPLEX, 02/05/2009 - no evidence of deep vein thrombosis, Baker's cyst  . Chest pain     2D ECHO, 02/05/2009 - EF >55%,   . CAD (coronary artery disease) 05/2012    angiosculpt cutting balloon of distal RCA and ostium of branch extending into the PLA vessel and PDA  branch. residual disease of LAD and LCX non obstructive, EF 60-65%   Past Surgical History  Procedure Laterality Date  . Bladder surgery  1980    "TVT"  . Irrigation and debridement abscess  12/19/2010    Procedure: IRRIGATION AND DEBRIDEMENT ABSCESS;  Surgeon: Rolm Bookbinder, MD;  Location: [place];  Service: General;  Laterality: Right;  . Incise and drain abcess  ; 04/26/11; 08/13/11    right breast  . Tonsillectomy  1988  . Refractive surgery  ~ 2010    right  . Irrigation and debridement abscess  08/13/2011    Procedure: MINOR INCISION AND DRAINAGE OF ABSCESS;  Surgeon: Gwenyth Ober, MD;  Location: [facility];  Service: General;  Laterality: Right;  Right Breast   . Irrigation and debridement abscess  11/17/2011    Procedure: IRRIGATION AND DEBRIDEMENT ABSCESS;  Surgeon: Imogene Burn. Georgette Dover, MD;  Location: [facility];  Service: General;  Laterality: Right;  irrigation and debridement right recurrent breast abscess  . Breast surgery Right     I&D for multiple abscesses  . Eye surgery Left     laser surgery  . Larynx surgery    . Ployp removed from voice box 03/30/12    . Incision and drainage abscess  Right 05/09/2012    Procedure: INCISION AND DRAINAGE RIGHT BREAST ABSCESS;  Surgeon: Imogene Burn. Georgette Dover, MD;  Location: [place];  Service: General;  Laterality: Right;  . Nm myoview ltd  01/26/2009    Normal study, no evidence of ischemia, EF 67%  . Cutting balloon    . Cardiac catheterization  05/2012    see medical Hx.   Family History  Problem Relation Age of Onset  . COPD Mother   . Cancer Mother     lymphoma  . Heart disease Mother   . Cancer Maternal Aunt     breast, colon  . Hyperlipidemia Father   . Heart disease Father   . Hypertension Brother   . Hypertension Maternal Grandmother   . Diabetes Maternal Grandmother    History  Substance Use Topics  . Smoking status: Former Smoker -- 0.25 packs/day for 30 years    Types: Cigarettes    Quit date: 03/11/2012  . Smokeless tobacco: Never  Used     Comment: 08/14/11 "quit for 3 wk 05/2011; was smoking 1 ppd since age 15 before I quit; at least I've cut down to 1/4 ppd"  . 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  Gastrointestinal: Negative for nausea and vomiting.  Genitourinary: Negative for difficulty urinating.  Musculoskeletal: Positive for arthralgias (left 3rd finger).  Neurological: Negative for weakness, numbness and headaches.  All other systems reviewed and are negative.     Allergies  Review of patient's allergies indicates no known allergies.  Home Medications   Prior to Admission medications   Medication Sig Start Date End Date Taking? Authorizing Provider  acyclovir (ZOVIRAX) 200 MG capsule Take by mouth 2 (two) times daily.    Historical Provider, MD  albuterol (PROVENTIL HFA;VENTOLIN HFA) 108 (90 BASE) MCG/ACT inhaler Inhale 2 puffs into the lungs every 6 (six) hours as needed for wheezing.    Historical Provider, MD  aspirin 81 MG chewable tablet Chew 162 mg by mouth daily.    Historical Provider, MD  atorvastatin (LIPITOR) 20 MG tablet Take 1 tablet (20 mg total) by mouth daily. 09/19/12   Troy Sine, MD  buPROPion (WELLBUTRIN XL) 150 MG 24 hr tablet Take 150 mg by mouth daily.    Historical Provider, MD  carvedilol (COREG) 6.25 MG tablet TAKE 1 TABLET BY MOUTH TWICE DAILY 05/28/13   Troy Sine, MD  clopidogrel (PLAVIX) 75 MG tablet TAKE 1 TABLET BY MOUTH EVERY DAY 03/09/13   Cecilie Kicks, NP  Cyanocobalamin (B-12 PO) Take by mouth daily.    Historical Provider, MD  divalproex (DEPAKOTE ER) 500 MG 24 hr tablet Take 500 mg by mouth at bedtime.     Historical Provider, MD  fesoterodine (TOVIAZ) 4 MG TB24 Take 4 mg by mouth daily.    Historical Provider, MD  fluconazole (DIFLUCAN) 150 MG  tablet Take 1 tablet (150 mg total) by mouth once. 07/20/12   Shelly Bombard, MD  fluticasone (VERAMYST) 27.5 MCG/SPRAY nasal spray Place 2 sprays into the nose daily as needed.     Historical Provider, MD  gabapentin (NEURONTIN) 300 MG capsule Take 600 mg by mouth 2 (two) times daily.     Historical  Provider, MD  gabapentin (NEURONTIN) 600 MG tablet Take 1 tablet (600 mg total) by mouth 2 (two) times daily. 04/06/13   Philemon Kingdom, MD  insulin aspart (NOVOLOG) 100 UNIT/ML injection Inject under skin before meals up to 15 units 3x a day. 11/14/12   Philemon Kingdom, MD  insulin detemir (LEVEMIR) 100 UNIT/ML injection Inject 0.35 mLs (35 Units total) into the skin at bedtime. 04/10/13   Philemon Kingdom, MD  Insulin Syringe-Needle U-100 30G X 5/16" 1 ML MISC Use as directed 4 times daily for insulin injections. 11/28/12   Philemon Kingdom, MD  lisinopril (PRINIVIL,ZESTRIL) 10 MG tablet Take 10 mg by mouth daily.    Historical Provider, MD  metFORMIN (GLUCOPHAGE-XR) 500 MG 24 hr tablet TAKE 1 TABLET BY MOUTH EVERY DAY WITH BREAKFAST. 05/24/13   Philemon Kingdom, MD  omeprazole (PRILOSEC) 20 MG capsule Take 20 mg by mouth daily.    Historical Provider, MD  PARoxetine (PAXIL-CR) 37.5 MG 24 hr tablet Take 37.5 mg by mouth daily.     Historical Provider, MD  pentosan polysulfate (ELMIRON) 100 MG capsule Take 100 mg by mouth daily as needed. PRN per patient    Historical Provider, MD  pregabalin (LYRICA) 75 MG capsule Take 75 mg by mouth 2 (two) times daily.    Historical Provider, MD  tinidazole (TINDAMAX) 500 MG tablet Take 1 tablet (500 mg total) by mouth daily with breakfast. 07/20/12   Shelly Bombard, MD  traZODone (DESYREL) 100 MG tablet Take 50 mg by mouth at bedtime.    Historical Provider, MD   Triage Vitals: BP 122/72  Pulse 96  Temp(Src) 97.9 F (36.6 C) (Oral)  Resp 18  SpO2 95%  Physical Exam  Nursing note and vitals reviewed. Constitutional: She is oriented to person, place, and  time. She appears well-developed and well-nourished. No distress.  HENT:  Head: Normocephalic and atraumatic.  Mouth/Throat: Oropharynx is clear and moist.  Eyes: Conjunctivae and EOM are normal. Pupils are equal, round, and reactive to light.  Neck: Normal range of motion.  Cardiovascular: Normal rate, regular rhythm and normal heart sounds.   Pulmonary/Chest: Effort normal and breath sounds normal. No respiratory distress. She has no wheezes.  Musculoskeletal: Normal range of motion.       Hands: Left middle finger with small paronychia along medial margin of nail bed. No active bleeding or drainage. Localized tenderness without erythema or cellulitic change. Limited ROM secondary to pain and swelling; distal sensation intact  Neurological: She is alert and oriented to person, place, and time.  Skin: Skin is warm and dry. She is not diaphoretic.  Psychiatric: She has a normal mood and affect.    ED Course  Procedures (including critical care time) DIAGNOSTIC STUDIES: Oxygen Saturation is 95% on room air, adequate by my interpretation.    COORDINATION OF CARE: 2:16 PM- Patient informed of current plan for treatment and evaluation and agrees with plan at this time.     Labs Review Labs Reviewed - No data to display  Imaging Review No results found.   EKG Interpretation None      MDM   Final diagnoses:  Paronychia of third finger, left   Paronychia of left middle finger along medial nail bed with mild swelling of distal aspect after removing artificial nail herself yesterday.  No cellulitic change or streaking of finger.  Given pts hx of DM, will start on abx.  Encouraged to continue warm soaks at home.  vicodin for pain as pt cannot  take NSAIDs.  Close FU with PCP.  Discussed plan with patient, he/she acknowledged understanding and agreed with plan of care.  Return precautions given for new or worsening symptoms.  I personally performed the services described in this  documentation, which was scribed in my presence. The recorded information has been reviewed and is accurate.  Larene Pickett, PA-C 06/20/13 1507

## 2013-06-20 NOTE — Discharge Instructions (Signed)
Continue warm soaks at home to help with healing. Take the prescribed medication as directed. Leave artifical nails off for the next several weeks until nailbed is completely healed. Follow-up with your primary care physician. Return to the ED for new or worsening symptoms.

## 2013-07-31 ENCOUNTER — Ambulatory Visit (INDEPENDENT_AMBULATORY_CARE_PROVIDER_SITE_OTHER): Payer: Medicaid Other | Admitting: Obstetrics

## 2013-07-31 VITALS — BP 108/71 | HR 90 | Temp 98.2°F

## 2013-07-31 DIAGNOSIS — R350 Frequency of micturition: Secondary | ICD-10-CM

## 2013-07-31 DIAGNOSIS — D259 Leiomyoma of uterus, unspecified: Secondary | ICD-10-CM

## 2013-07-31 DIAGNOSIS — IMO0002 Reserved for concepts with insufficient information to code with codable children: Secondary | ICD-10-CM

## 2013-07-31 DIAGNOSIS — A499 Bacterial infection, unspecified: Secondary | ICD-10-CM

## 2013-07-31 DIAGNOSIS — N760 Acute vaginitis: Secondary | ICD-10-CM

## 2013-07-31 DIAGNOSIS — N912 Amenorrhea, unspecified: Secondary | ICD-10-CM

## 2013-07-31 DIAGNOSIS — N946 Dysmenorrhea, unspecified: Secondary | ICD-10-CM

## 2013-07-31 DIAGNOSIS — B9689 Other specified bacterial agents as the cause of diseases classified elsewhere: Secondary | ICD-10-CM

## 2013-07-31 LAB — POCT URINE PREGNANCY: Preg Test, Ur: NEGATIVE

## 2013-07-31 MED ORDER — TINIDAZOLE 500 MG PO TABS: 1000.0000 mg | ORAL_TABLET | Freq: Every day | ORAL | Status: DC

## 2013-07-31 MED ORDER — OXYCODONE HCL 10 MG PO TABS: 10.0000 mg | ORAL_TABLET | Freq: Four times a day (QID) | ORAL | Status: DC | PRN

## 2013-07-31 NOTE — Progress Notes (Signed)
Patient ID: Cassidy [name], female   DOB: [birth year], 48 y.o.   MRN: [id number]  Chief Complaint  Patient presents with  . Other    UPT    HPI Cassidy [name] is [name] 48 y.o. female.  Complains of painful intercourse and painful periods.  H/O fibroid and has H/O pain in the area of bladder mesh procedure, per patient. HPI   Past Medical History  Diagnosis Date  . Hypertension   . GERD (gastroesophageal reflux disease)   . Breast abscess     recurrent; on right  . High cholesterol   . Type II diabetes mellitus   . Anemia   . Sickle cell trait   . Fibromyalgia   . Genital warts   . Constipation   . Diabetic peripheral neuropathy   . Depression   . Bipolar depression   . PTSD (post-traumatic stress disorder)   . Complication of anesthesia     deep pain in legs and arms for 3 days post anesthesia   . Asthma     seasonal  . Tooth caries     pt. states she will have an extraction in 5/14 on tooth on bottom right  . Pain in limb     LEA VENOUS DUPLEX, 02/05/2009 - no evidence of deep vein thrombosis, Baker's cyst  . Chest pain     2D ECHO, 02/05/2009 - EF >55%,   . CAD (coronary artery disease) 05/2012    angiosculpt cutting balloon of distal RCA and ostium of branch extending into the PLA vessel and PDA branch. residual disease of LAD and LCX non obstructive, EF 60-65%    Past Surgical History  Procedure Laterality Date  . Bladder surgery  1980    "TVT"  . Irrigation and debridement abscess  12/19/2010    Procedure: IRRIGATION AND DEBRIDEMENT ABSCESS;  Surgeon: Rolm Bookbinder, MD;  Location: [place];  Service: General;  Laterality: Right;  . Incise and drain abcess  ; 04/26/11; 08/13/11    right breast  . Tonsillectomy  1988  . Refractive surgery  ~ 2010    right  . Irrigation and debridement abscess  08/13/2011    Procedure: MINOR INCISION AND DRAINAGE OF ABSCESS;  Surgeon: Gwenyth Ober, MD;  Location: [place];  Service: General;  Laterality: Right;  Right Breast   .  Irrigation and debridement abscess  11/17/2011    Procedure: IRRIGATION AND DEBRIDEMENT ABSCESS;  Surgeon: Imogene Burn. Georgette Dover, MD;  Location: [place];  Service: General;  Laterality: Right;  irrigation and debridement right recurrent breast abscess  . Breast surgery Right     I&D for multiple abscesses  . Eye surgery Left     laser surgery  . Larynx surgery    . Ployp removed from voice box 03/30/12    . Incision and drainage abscess Right 05/09/2012    Procedure: INCISION AND DRAINAGE RIGHT BREAST ABSCESS;  Surgeon: Imogene Burn. Georgette Dover, MD;  Location: [place];  Service: General;  Laterality: Right;  . Nm myoview ltd  01/26/2009    Normal study, no evidence of ischemia, EF 67%  . Cutting balloon    . Cardiac catheterization  05/2012    see medical Hx.    Family History  Problem Relation Age of Onset  . COPD Mother   . Cancer Mother     lymphoma  . Heart disease Mother   . Cancer Maternal Aunt     breast, colon  . Hyperlipidemia Father   .  Heart disease Father   . Hypertension Brother   . Hypertension Maternal Grandmother   . Diabetes Maternal Grandmother     Social History History  Substance Use Topics  . Smoking status: Former Smoker -- 0.25 packs/day for 30 years    Types: Cigarettes    Quit date: 03/11/2012  . Smokeless tobacco: Never Used     Comment: 08/14/11 "quit for 3 wk 05/2011; was smoking 1 ppd since age 47 before I quit; at least I've cut down to 1/4 ppd"  . 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 acyclovir (ZOVIRAX) 200 MG capsule Take by mouth 2 (two) times daily.      Marland Kitchen albuterol (PROVENTIL HFA;VENTOLIN HFA) 108 (90 BASE) MCG/ACT inhaler Inhale 2 puffs into the lungs every 6 (six) hours as needed for wheezing.      Marland Kitchen aspirin 81 MG chewable tablet Chew 162 mg by mouth daily.      Marland Kitchen atorvastatin (LIPITOR) 20 MG tablet  Take 1 tablet (20 mg total) by mouth daily.  30 tablet  6  . buPROPion (WELLBUTRIN XL) 150 MG 24 hr tablet Take 150 mg by mouth daily.      . carvedilol (COREG) 6.25 MG tablet TAKE 1 TABLET BY MOUTH TWICE DAILY  60 tablet  2  . clopidogrel (PLAVIX) 75 MG tablet TAKE 1 TABLET BY MOUTH EVERY DAY  30 tablet  5  . Cyanocobalamin (B-12 PO) Take by mouth daily.      . divalproex (DEPAKOTE ER) 500 MG 24 hr tablet Take 500 mg by mouth at bedtime.       . fesoterodine (TOVIAZ) 4 MG TB24 Take 4 mg by mouth daily.      . fluconazole (DIFLUCAN) 150 MG tablet Take 1 tablet (150 mg total) by mouth once. Repeat in 72 hours if needed.  2 tablet  0  . fluticasone (VERAMYST) 27.5 MCG/SPRAY nasal spray Place 2 sprays into the nose daily as needed.       . gabapentin (NEURONTIN) 300 MG capsule Take 600 mg by mouth 2 (two) times daily.       . insulin aspart (NOVOLOG) 100 UNIT/ML injection Inject under skin before meals up to 15 units 3x [name] day.  2 vial  11  . insulin detemir (LEVEMIR) 100 UNIT/ML injection Inject 0.35 mLs (35 Units total) into the skin at bedtime.  20 mL  3  . Insulin Syringe-Needle U-100 30G X 5/16" 1 ML MISC Use as directed 4 times daily for insulin injections.  150 each  prn  . lisinopril (PRINIVIL,ZESTRIL) 10 MG tablet Take 10 mg by mouth daily.      . metFORMIN (GLUCOPHAGE-XR) 500 MG 24 hr tablet TAKE 1 TABLET BY MOUTH EVERY DAY WITH BREAKFAST.  90 tablet  3  . omeprazole (PRILOSEC) 20 MG capsule Take 20 mg by mouth daily.      Marland Kitchen PARoxetine (PAXIL-CR) 37.5 MG 24 hr tablet Take 37.5 mg by mouth daily.       . pregabalin (LYRICA) 75 MG capsule Take 75 mg by mouth 2 (two) times daily.      . traZODone (DESYREL) 100 MG tablet Take 50 mg by mouth at bedtime.      . fluconazole (DIFLUCAN) 150 MG tablet Take 1 tablet (150 mg total) by mouth once.  1 tablet  4  .  gabapentin (NEURONTIN) 600 MG tablet Take 1 tablet (600 mg total) by mouth 2 (two) times daily.  60 tablet  1  . HYDROcodone-acetaminophen  (NORCO/VICODIN) 5-325 MG per tablet Take 1 tablet by mouth every 4 (four) hours as needed.  6 tablet  0  . Oxycodone HCl 10 MG TABS Take 1 tablet (10 mg total) by mouth every 6 (six) hours as needed.  40 tablet  0  . pentosan polysulfate (ELMIRON) 100 MG capsule Take 100 mg by mouth daily as needed. PRN per patient      . sulfamethoxazole-trimethoprim (SEPTRA DS) 800-160 MG per tablet Take 1 tablet by mouth every 12 (twelve) hours.  20 tablet  0  . tinidazole (TINDAMAX) 500 MG tablet Take 2 tablets (1,000 mg total) by mouth daily with breakfast.  10 tablet  2   No current facility-administered medications for this visit.    Review of Systems Review of Systems Constitutional: negative for fatigue and weight loss Respiratory: negative for cough and wheezing Cardiovascular: negative for chest pain, fatigue and palpitations Gastrointestinal: negative for abdominal pain and change in bowel habits Genitourinary:negative Integument/breast: negative for nipple discharge Musculoskeletal:negative for myalgias Neurological: negative for gait problems and tremors Behavioral/Psych: negative for abusive relationship, depression Endocrine: negative for temperature intolerance     Blood pressure 108/71, pulse 90, temperature 98.2 F (36.8 C), last menstrual period 06/28/2013.  Physical Exam Physical Exam:  Deferred  100% of 10 min visit spent on counseling and coordination of care.   Data Reviewed Ultrasound and labs  Assessment    Uterine Fibroid  Pelvic pain  Dysmenorrhea and Dyspareunia     Plan    Ultrasound ordered She is seeing [name]rologist for bladder problems F/U 2 weeks for annual and pap  Orders Placed This Encounter  Procedures  . Urine Culture  . US Pelvis Complete    Order Specific Question:  Reason for Exam (SYMPTOM  OR DIAGNOSIS REQUIRED)    Answer:  625.3    Order Specific Question:  Preferred imaging location?    Answer:  [facility]  . US Transvaginal  Non-OB    Standing Status: Future     Number of Occurrences:      Standing Expiration Date: 10/02/2014    Order Specific Question:  Reason for Exam (SYMPTOM  OR DIAGNOSIS REQUIRED)    Answer:  625.3    Order Specific Question:  Preferred imaging location?    Answer:  [facility] urine pregnancy   Meds ordered this encounter  Medications  . Oxycodone HCl 10 MG TABS    Sig: Take 1 tablet (10 mg total) by mouth every 6 (six) hours as needed.    Dispense:  40 tablet    Refill:  0  . tinidazole (TINDAMAX) 500 MG tablet    Sig: Take 2 tablets (1,000 mg total) by mouth daily with breakfast.    Dispense:  10 tablet    Refill:  2           Cassidy [name] 07/31/2013, 2:48 PM

## 2013-08-01 LAB — URINE CULTURE
Colony Count: NO GROWTH
ORGANISM ID, BACTERIA: NO GROWTH

## 2013-08-08 ENCOUNTER — Ambulatory Visit (HOSPITAL_COMMUNITY)
Admission: RE | Admit: 2013-08-08 | Discharge: 2013-08-08 | Disposition: A | Discharge status: Home / Self Care | Payer: Medicaid Other | Source: Ambulatory Visit | Attending: Obstetrics | Admitting: Obstetrics

## 2013-08-08 DIAGNOSIS — D259 Leiomyoma of uterus, unspecified: Secondary | ICD-10-CM | POA: Diagnosis not present

## 2013-08-08 DIAGNOSIS — N946 Dysmenorrhea, unspecified: Secondary | ICD-10-CM | POA: Insufficient documentation

## 2013-08-14 ENCOUNTER — Other Ambulatory Visit: Payer: Self-pay | Admitting: Obstetrics

## 2013-08-14 ENCOUNTER — Encounter: Payer: Self-pay | Admitting: Obstetrics

## 2013-08-14 ENCOUNTER — Ambulatory Visit (INDEPENDENT_AMBULATORY_CARE_PROVIDER_SITE_OTHER): Payer: Medicaid Other | Admitting: Obstetrics

## 2013-08-14 VITALS — Temp 97.7°F | Ht 68.0 in

## 2013-08-14 DIAGNOSIS — Z113 Encounter for screening for infections with a predominantly sexual mode of transmission: Secondary | ICD-10-CM

## 2013-08-14 DIAGNOSIS — L0292 Furuncle, unspecified: Secondary | ICD-10-CM

## 2013-08-14 DIAGNOSIS — N946 Dysmenorrhea, unspecified: Secondary | ICD-10-CM

## 2013-08-14 DIAGNOSIS — Z0000 Encounter for general adult medical examination without abnormal findings: Secondary | ICD-10-CM

## 2013-08-14 DIAGNOSIS — B373 Candidiasis of vulva and vagina: Secondary | ICD-10-CM

## 2013-08-14 DIAGNOSIS — B3731 Acute candidiasis of vulva and vagina: Secondary | ICD-10-CM

## 2013-08-14 MED ORDER — OXYCODONE HCL 10 MG PO TABS: 10.0000 mg | ORAL_TABLET | Freq: Four times a day (QID) | ORAL | Status: DC | PRN

## 2013-08-14 MED ORDER — DOXYCYCLINE HYCLATE 100 MG PO CAPS: 100.0000 mg | ORAL_CAPSULE | Freq: Two times a day (BID) | ORAL | Status: DC

## 2013-08-14 MED ORDER — FLUCONAZOLE 150 MG PO TABS: 150.0000 mg | ORAL_TABLET | Freq: Once | ORAL | Status: DC

## 2013-08-14 NOTE — Progress Notes (Signed)
Subjective:     Cassidy [name] is a 48 y.o. female here for a routine exam.  Current complaints: Malodorous vaginal discharge.    Personal health questionnaire:  Is patient [name], have a family history of breast and/or ovarian cancer: no Is there a family history of uterine cancer diagnosed at age < 38, gastrointestinal cancer, urinary tract cancer, family member who is a [name]: no Is the patient overweight and hypertensive, family history of diabetes, personal history of gestational diabetes or PCOS: no Is patient over 44, have PCOS,  family history of premature CHD under age 52, diabetes, smoke, have hypertension or peripheral artery disease:  no At any time, has a partner hit, kicked or otherwise hurt or frightened you?: no Over the past 2 weeks, have you felt down, depressed or hopeless?: no Over the past 2 weeks, have you felt little interest or pleasure in doing things?:no   Gynecologic History Patient's last menstrual period was 06/28/2013. Contraception: none Last Pap: 2014. Results were: normal Last mammogram: 2015.  Results were normal.  Obstetric History OB History  Gravida Para Term Preterm AB SAB TAB Ectopic Multiple Living  1 1 1       1     # Outcome Date GA Lbr Len/2nd Weight Sex Delivery Anes PTL Lv  1 TRM               Past Medical History  Diagnosis Date  . Hypertension   . GERD (gastroesophageal reflux disease)   . Breast abscess     recurrent; on right  . High cholesterol   . Type II diabetes mellitus   . Anemia   . Sickle cell trait   . Fibromyalgia   . Genital warts   . Constipation   . Diabetic peripheral neuropathy   . Depression   . Bipolar depression   . PTSD (post-traumatic stress disorder)   . Complication of anesthesia     deep pain in legs and arms for 3 days post anesthesia   . Asthma     seasonal  . Tooth caries     pt. states she will have an extraction in 5/14 on tooth on bottom right  .  Pain in limb     LEA VENOUS DUPLEX, 02/05/2009 - no evidence of deep vein thrombosis, Baker's cyst  . Chest pain     2D ECHO, 02/05/2009 - EF >55%,   . CAD (coronary artery disease) 05/2012    angiosculpt cutting balloon of distal RCA and ostium of branch extending into the PLA vessel and PDA branch. residual disease of LAD and LCX non obstructive, EF 60-65%    Past Surgical History  Procedure Laterality Date  . Bladder surgery  1980    "TVT"  . Irrigation and debridement abscess  12/19/2010    Procedure: IRRIGATION AND DEBRIDEMENT ABSCESS;  Surgeon: Rolm Bookbinder, MD;  Location: [place];  Service: General;  Laterality: Right;  . Incise and drain abcess  ; 04/26/11; 08/13/11    right breast  . Tonsillectomy  1988  . Refractive surgery  ~ 2010    right  . Irrigation and debridement abscess  08/13/2011    Procedure: MINOR INCISION AND DRAINAGE OF ABSCESS;  Surgeon: Gwenyth Ober, MD;  Location: [place];  Service: General;  Laterality: Right;  Right Breast   . Irrigation and debridement abscess  11/17/2011    Procedure: IRRIGATION AND DEBRIDEMENT ABSCESS;  Surgeon: Imogene Burn. Georgette Dover, MD;  Location: [place];  Service: General;  Laterality: Right;  irrigation and debridement right recurrent breast abscess  . Breast surgery Right     I&D for multiple abscesses  . Eye surgery Left     laser surgery  . Larynx surgery    . Ployp removed from voice box 03/30/12    . Incision and drainage abscess Right 05/09/2012    Procedure: INCISION AND DRAINAGE RIGHT BREAST ABSCESS;  Surgeon: Imogene Burn. Georgette Dover, MD;  Location: [place];  Service: General;  Laterality: Right;  . Nm myoview ltd  01/26/2009    Normal study, no evidence of ischemia, EF 67%  . Cutting balloon    . Cardiac catheterization  05/2012    see medical Hx.    Current outpatient prescriptions:acyclovir (ZOVIRAX) 200 MG capsule, Take by mouth 2 (two) times daily., Disp: , Rfl: ;  albuterol (PROVENTIL HFA;VENTOLIN HFA) 108 (90 BASE) MCG/ACT inhaler, Inhale  2 puffs into the lungs every 6 (six) hours as needed for wheezing., Disp: , Rfl: ;  aspirin 81 MG chewable tablet, Chew 162 mg by mouth daily., Disp: , Rfl:  atorvastatin (LIPITOR) 20 MG tablet, Take 1 tablet (20 mg total) by mouth daily., Disp: 30 tablet, Rfl: 6;  carvedilol (COREG) 6.25 MG tablet, TAKE 1 TABLET BY MOUTH TWICE DAILY, Disp: 60 tablet, Rfl: 2;  clopidogrel (PLAVIX) 75 MG tablet, TAKE 1 TABLET BY MOUTH EVERY DAY, Disp: 30 tablet, Rfl: 5;  Cyanocobalamin (B-12 PO), Take by mouth daily., Disp: , Rfl:  divalproex (DEPAKOTE ER) 500 MG 24 hr tablet, Take 500 mg by mouth at bedtime. , Disp: , Rfl: ;  fluconazole (DIFLUCAN) 150 MG tablet, Take 1 tablet (150 mg total) by mouth once. Repeat in 72 hours if needed., Disp: 2 tablet, Rfl: 0;  fluconazole (DIFLUCAN) 150 MG tablet, Take 1 tablet (150 mg total) by mouth once., Disp: 1 tablet, Rfl: 4 fluticasone (VERAMYST) 27.5 MCG/SPRAY nasal spray, Place 2 sprays into the nose daily as needed. , Disp: , Rfl: ;  gabapentin (NEURONTIN) 300 MG capsule, Take 600 mg by mouth 2 (two) times daily. , Disp: , Rfl: ;  insulin aspart (NOVOLOG) 100 UNIT/ML injection, Inject under skin before meals up to 15 units 3x a day., Disp: 2 vial, Rfl: 11 insulin detemir (LEVEMIR) 100 UNIT/ML injection, Inject 0.35 mLs (35 Units total) into the skin at bedtime., Disp: 20 mL, Rfl: 3;  Insulin Syringe-Needle U-100 30G X 5/16" 1 ML MISC, Use as directed 4 times daily for insulin injections., Disp: 150 each, Rfl: prn;  lisinopril (PRINIVIL,ZESTRIL) 10 MG tablet, Take 10 mg by mouth daily., Disp: , Rfl:  metFORMIN (GLUCOPHAGE-XR) 500 MG 24 hr tablet, TAKE 1 TABLET BY MOUTH EVERY DAY WITH BREAKFAST., Disp: 90 tablet, Rfl: 3;  omeprazole (PRILOSEC) 20 MG capsule, Take 20 mg by mouth daily., Disp: , Rfl: ;  pregabalin (LYRICA) 75 MG capsule, Take 75 mg by mouth 2 (two) times daily., Disp: , Rfl: ;  traZODone (DESYREL) 100 MG tablet, Take 50 mg by mouth at bedtime., Disp: , Rfl:  buPROPion  (WELLBUTRIN XL) 150 MG 24 hr tablet, Take 150 mg by mouth daily., Disp: , Rfl: ;  doxycycline (VIBRAMYCIN) 100 MG capsule, Take 1 capsule (100 mg total) by mouth 2 (two) times daily., Disp: 14 capsule, Rfl: 2;  fesoterodine (TOVIAZ) 4 MG TB24, Take 4 mg by mouth daily., Disp: , Rfl: ;  Oxycodone HCl 10 MG TABS, Take 1 tablet (10 mg total) by mouth every 6 (six) hours as needed., Disp:  40 tablet, Rfl: 0 PARoxetine (PAXIL-CR) 37.5 MG 24 hr tablet, Take 37.5 mg by mouth daily. , Disp: , Rfl: ;  tinidazole (TINDAMAX) 500 MG tablet, Take 2 tablets (1,000 mg total) by mouth daily with breakfast., Disp: 10 tablet, Rfl: 2 No Known Allergies  History  Substance Use Topics  . Smoking status: Former Smoker -- 0.25 packs/day for 30 years    Types: Cigarettes    Quit date: 03/11/2012  . Smokeless tobacco: Never Used     Comment: 08/14/11 "quit for 3 wk 05/2011; was smoking 1 ppd since age 74 before I quit; at least I've cut down to 1/4 ppd"  . 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 COPD Mother   . Cancer Mother     lymphoma  . Heart disease Mother   . Cancer Maternal Aunt     breast, colon  . Hyperlipidemia Father   . Heart disease Father   . Hypertension Brother   . Hypertension Maternal Grandmother   . Diabetes Maternal Grandmother       Review of Systems  Constitutional: negative for fatigue and weight loss Respiratory: negative for cough and wheezing Cardiovascular: negative for chest pain, fatigue and palpitations Gastrointestinal: negative for abdominal pain and change in bowel habits Musculoskeletal:negative for myalgias Neurological: negative for gait problems and tremors Behavioral/Psych: negative for abusive relationship, depression Endocrine: negative for temperature intolerance   Genitourinary: positive for abnormal menstrual periods, genital lesions, hot flashes, sexual  problems and vaginal discharge Integument/breast: negative for breast lump, breast tenderness, nipple discharge and skin lesion(s)    Objective:       Temp(Src) 97.7 F (36.5 C)  Ht 5\' 8"  (1.727 m)  LMP 06/28/2013 General:   alert  Skin:   no rash or abnormalities  Lungs:   clear to auscultation bilaterally  Heart:   regular rate and rhythm, S1, S2 normal, no murmur, click, rub or gallop  Breasts:   normal without suspicious masses, skin or nipple changes or axillary nodes  Abdomen:  normal findings: no organomegaly, soft, non-tender and no hernia  Pelvis:  External genitalia: normal general appearance Urinary system: urethral meatus normal and bladder without fullness, nontender Vaginal: normal without tenderness, induration or masses.  Grey, thin discharge. Cervix: normal appearance Adnexa: normal bimanual exam Uterus: anteverted and non-tender, normal size   Lab Review Urine pregnancy test Labs reviewed yes Radiologic studies reviewed yes    Assessment:    Healthy female exam.   Dysmenorrhea  BV   Plan:    Oxycodone Rx Tinidazole dispensed    Education reviewed: calcium supplements, low fat, low cholesterol diet, safe sex/STD prevention, self breast exams and weight bearing exercise. Follow up in: 1 year.   Meds ordered this encounter  Medications  . Oxycodone HCl 10 MG TABS    Sig: Take 1 tablet (10 mg total) by mouth every 6 (six) hours as needed.    Dispense:  40 tablet    Refill:  0  . doxycycline (VIBRAMYCIN) 100 MG capsule    Sig: Take 1 capsule (100 mg total) by mouth 2 (two) times daily.    Dispense:  14 capsule    Refill:  2  . fluconazole (DIFLUCAN) 150 MG tablet    Sig: Take 1 tablet (150 mg total) by mouth once.    Dispense:  1 tablet    Refill:  4   Orders Placed This Encounter  Procedures  . WET PREP BY MOLECULAR PROBE  . GC/Chlamydia Probe Amp  . HIV antibody  . Hepatitis B surface antigen  . RPR  . Hepatitis C antibody

## 2013-08-15 ENCOUNTER — Encounter: Payer: Self-pay | Admitting: Obstetrics

## 2013-08-15 LAB — WET PREP BY MOLECULAR PROBE
Candida species: POSITIVE — AB
GARDNERELLA VAGINALIS: NEGATIVE
Trichomonas vaginosis: NEGATIVE

## 2013-08-15 LAB — RPR

## 2013-08-15 LAB — HEPATITIS C ANTIBODY: HCV AB: NEGATIVE

## 2013-08-15 LAB — GC/CHLAMYDIA PROBE AMP
CT PROBE, AMP APTIMA: NEGATIVE
GC PROBE AMP APTIMA: NEGATIVE

## 2013-08-15 LAB — HEPATITIS B SURFACE ANTIGEN: Hepatitis B Surface Ag: NEGATIVE

## 2013-08-15 LAB — HIV ANTIBODY (ROUTINE TESTING W REFLEX): HIV 1&2 Ab, 4th Generation: NONREACTIVE

## 2013-08-16 LAB — PAP IG AND HPV HIGH-RISK: HPV DNA HIGH RISK: NOT DETECTED

## 2013-09-06 ENCOUNTER — Other Ambulatory Visit: Payer: Self-pay | Admitting: Cardiovascular Disease

## 2013-09-06 NOTE — Telephone Encounter (Signed)
Rx was sent to pharmacy electronically. 

## 2013-09-13 ENCOUNTER — Other Ambulatory Visit: Payer: Self-pay | Admitting: *Deleted

## 2013-09-13 MED ORDER — METFORMIN HCL ER 500 MG PO TB24: 500.0000 mg | ORAL_TABLET | Freq: Two times a day (BID) | ORAL | Status: DC

## 2013-09-24 ENCOUNTER — Telehealth: Payer: Self-pay | Admitting: Internal Medicine

## 2013-09-24 ENCOUNTER — Other Ambulatory Visit: Payer: Self-pay | Admitting: *Deleted

## 2013-09-24 MED ORDER — INSULIN ASPART 100 UNIT/ML ~~LOC~~ SOLN: SUBCUTANEOUS | Status: DC

## 2013-09-24 NOTE — Telephone Encounter (Signed)
Broke the novolog valve please advise on how she can get a new one

## 2013-09-24 NOTE — Telephone Encounter (Signed)
That is ridiculous! Let's try to call in a new Rx for Novolog to her pharmacy for 1 vial until she can refill the original Rx. Please try to talk to a pharmacist.

## 2013-09-24 NOTE — Telephone Encounter (Signed)
Called pt (she dropped the vial of Novolog and broke it) and asked her if she had contacted her pharmacy. Pt stated that she has, they advised her that her physician would have to contact Medicaid. Please advise.

## 2013-09-24 NOTE — Telephone Encounter (Signed)
Rx for 1 vial (emergency vial) sent to pt's pharmacy. Called pt and advised her. Pt understood.

## 2013-10-09 ENCOUNTER — Other Ambulatory Visit: Payer: Self-pay | Admitting: *Deleted

## 2013-10-09 DIAGNOSIS — A6000 Herpesviral infection of urogenital system, unspecified: Secondary | ICD-10-CM

## 2013-10-09 MED ORDER — ACYCLOVIR 400 MG PO TABS: 400.0000 mg | ORAL_TABLET | Freq: Three times a day (TID) | ORAL | Status: DC

## 2013-10-11 ENCOUNTER — Encounter: Payer: Self-pay | Admitting: Internal Medicine

## 2013-10-11 ENCOUNTER — Ambulatory Visit (INDEPENDENT_AMBULATORY_CARE_PROVIDER_SITE_OTHER): Payer: Medicaid Other | Admitting: Internal Medicine

## 2013-10-11 ENCOUNTER — Other Ambulatory Visit: Payer: Self-pay | Admitting: *Deleted

## 2013-10-11 VITALS — BP 118/64 | HR 93 | Temp 98.6°F | Resp 12 | Wt 251.0 lb

## 2013-10-11 DIAGNOSIS — Z23 Encounter for immunization: Secondary | ICD-10-CM

## 2013-10-11 DIAGNOSIS — E1149 Type 2 diabetes mellitus with other diabetic neurological complication: Secondary | ICD-10-CM

## 2013-10-11 LAB — BASIC METABOLIC PANEL
BUN: 12 mg/dL (ref 6–23)
CALCIUM: 9 mg/dL (ref 8.4–10.5)
CHLORIDE: 100 meq/L (ref 96–112)
CO2: 28 mEq/L (ref 19–32)
CREATININE: 0.9 mg/dL (ref 0.4–1.2)
GFR: 89.85 mL/min (ref >60.00)
Glucose, Bld: 148 mg/dL — ABNORMAL HIGH (ref 70–99)
Potassium: 3.8 mEq/L (ref 3.5–5.1)
Sodium: 136 mEq/L (ref 135–145)

## 2013-10-11 LAB — LIPID PANEL
Cholesterol: 187 mg/dL (ref 0–200)
HDL: 43 mg/dL (ref >39.00)
LDL Cholesterol: 119 mg/dL — ABNORMAL HIGH (ref 0–99)
NONHDL: 144
Total CHOL/HDL Ratio: 4
Triglycerides: 125 mg/dL (ref 0.0–149.0)
VLDL: 25 mg/dL (ref 0.0–40.0)

## 2013-10-11 LAB — HEMOGLOBIN A1C: Hgb A1c MFr Bld: 7.5 % — ABNORMAL HIGH (ref 4.6–6.5)

## 2013-10-11 MED ORDER — CANAGLIFLOZIN 100 MG PO TABS: ORAL_TABLET | ORAL | Status: DC

## 2013-10-11 NOTE — Progress Notes (Signed)
Patient ID: Cassidy [name], female   DOB: 1965-10-07, 48 y.o.   MRN: [id number]  HPI: Cassidy [name] is a 3 y.o.-year-old female, initially referred by her PCP, Cassidy [name], for management of DM2, dx 2005, insulin-dependent since 05/2012, uncontrolled, with complications (CAD, peripheral neuropathy). Last visit 8 mo ago!  Last hemoglobin A1c was: Lab Results  Component Value Date   HGBA1C 6.7* 03/09/2013   HGBA1C 6.6* 11/14/2012   HGBA1C 6.8* 08/07/2012   Pt is on a regimen of: - Levemir 35 units in am  - Novolog 15 units tid ac - NovoLog SSI: - 150-175: + 1 unit  - 176-200: + 2 units  - 201-225: + 3 units  - 226-250: + 4 units  - >250: + 5 units - Metformin XR 1000 mg bid added at last visit. Was on Metformin >> diarrhea.  She finished the [place] and could not get it from the pharmacy yet... Unclear if she can afford.  Pt checks her sugars 1-3 a day and they are higher: - am: 102-131 >> 98-129 (highest 140 x 1)  >> 134-179, 201, 204 - 2h after b'fast: 79-139 >> n/c - before lunch: 111-167 >> 88, 92, 98, 146 >> 120-183, 231 - 2h after lunch: not checking - before dinner: 103-126 >> 102-113 >> 148-149, 181 - 2h after dinner:120-212 >> 124, 139  >> n/c - bedtime:147-223 >>  171, 178, 202, 274 x 1 >> 150, 181-213 - nighttime: not checking AccuChek.  No lows. Lowest sugar was 120; she has hypoglycemia awareness at 90s. Highest sugar was 231.  Pt's meals are - per last visit's review: - Breakfast: glass of skim milk, [place] sausage (not bacon anymore), grits - 1 cup, toast - Lunch: salads and soups ([employer]) - Dinner: pinto beans, casseroles, [place] and chicken meat; spaghetti Walks after dinner. - Snacks: baby carrots, jello  - no CKD, last BUN/creatinine:  Lab Results  Component Value Date   BUN 12 08/07/2012   CREATININE 0.81 08/07/2012  She is on Lisinopril 10.  - last set of lipids: Lab Results  Component Value Date   CHOL 176 05/28/2012   HDL 38*  05/28/2012   LDLCALC 75 08/07/2012   TRIG 73 08/07/2012   CHOLHDL 4.6 05/28/2012  She is on Lipitor 20. She is on ASA 81. - last eye exam was in 09/2012. 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 Dr Ricki Miller.  - + numbness and tingling in her feet >> was on Neurontin 600 mg bid ([facility] would not approve more) >> on Neurontin 300 bid + Lyrica 75 bid >> feels much better, not having SEs.   I reviewed her chart and she also has a history of bipolar disorder, HL, GERD, back pain.  She is walking 2 miles 2x a week, and exercise at home, too.  ROS: Constitutional: + weight gain, + fatigue, + hot flashes, + insomnia Eyes: + blurry vision, no xerophthalmia  ENT: no sore throat, no nodules palpated in throat, no dysphagia/odynophagia, no hoarseness; + tinnitus Cardiovascular:+ CP/no SOB/no palpitations/improved leg swelling since started HCTZ Respiratory: + cough/SOB/+ wheezing Gastrointestinal: no N/V/D/+C Musculoskeletal: no muscle/+ joint aches Skin: no rashes, + easy bruising, + itching Neurological: no tremors/numbness/tingling/dizziness  I reviewed pt's medications, allergies, PMH, social hx, family hx and no changes required, except as mentioned above. Also started furosemide + potassium.   PE: BP 118/64  Pulse 93  Temp(Src) 98.6 F (37 C) (Oral)  Resp 12  Wt 251 lb (113.853  kg)  SpO2 97% Wt Readings from Last 3 Encounters:  10/11/13 251 lb (113.853 kg)  03/09/13 248 lb (112.492 kg)  11/14/12 255 lb 9.6 oz (115.939 kg)   Constitutional: overweight, in NAD Eyes: PERRLA, EOMI, no exophthalmos ENT: moist mucous membranes, no thyromegaly, no cervical lymphadenopathy Cardiovascular: RRR, No MRG Respiratory: CTA B Gastrointestinal: abdomen soft, NT, ND, BS+ Musculoskeletal: no deformities, strength intact in all 4 Skin: moist, warm, no rashes  ASSESSMENT: 1. DM2, insulin-dependent, uncontrolled, with complications - CAD - s/p AMI 05/2012 Had normal lower arterial duplex study  08/03/2012. - peripheral neuropathy (on Neurontin)  PLAN:  1. Patient with long-standing, controlled diabetes after starting basal-bolus insulin regimen. Now with increased sugars 2/2 relaxed diet and more stress - We discussed about options for treatment, and I suggested to:  Patient Instructions  Please continue: - Levemir 35 units in am >> move this to bedtime - Novolog 15 units 3x a day - NovoLog SSI:  - 150-175: + 1 unit  - 176-200: + 2 units  - 201-225: + 3 units  - 226-250: + 4 units  - >250: + 5 units Please add Invokana 100 mg in am. Please stop at the lab. Please return in 1.5 month with your sugar log.  - at next visit, if sugars still good and she is on Metformin, can try Victoza instead of mealtime insulin (at least for the first 2 meals of the day). She has been on Byetta in the past. - continue checking CBGs 3x times a day, rotating checks - given new sugar log - up to date with yearly eye exams - check A1c today, also BMP and Lipids - will give her the flu vaccine today - Return to clinic in 1.5 mo with sugar log   Office Visit on 10/11/2013  Component Date Value Ref Range Status  . Hemoglobin A1C 10/11/2013 7.5* 4.6 - 6.5 % Final   Glycemic Control Guidelines for People with Diabetes:Non Diabetic:  <6%Goal of Therapy: <7%Additional Action Suggested:  >8%   . Cholesterol 10/11/2013 187  0 - 200 mg/dL Final   ATP III Classification       Desirable:  < 200 mg/dL               Borderline High:  200 - 239 mg/dL          High:  > = 240 mg/dL  . Triglycerides 10/11/2013 125.0  0.0 - 149.0 mg/dL Final   Normal:  <150 mg/dLBorderline High:  150 - 199 mg/dL  . HDL 10/11/2013 43.00  >39.00 mg/dL Final  . VLDL 10/11/2013 25.0  0.0 - 40.0 mg/dL Final  . LDL Cholesterol 10/11/2013 119* 0 - 99 mg/dL Final  . Total CHOL/HDL Ratio 10/11/2013 4   Final                  Men          Women1/2 Average Risk     3.4          3.3Average Risk          5.0          4.42X Average Risk           9.6          7.13X Average Risk          15.0          11.0                      .  NonHDL 10/11/2013 144.00   Final   NOTE:  Non-HDL goal should be 30 mg/dL higher than patient's LDL goal (i.e. LDL goal of < 70 mg/dL, would have non-HDL goal of < 100 mg/dL)  . Sodium 10/11/2013 136  135 - 145 mEq/L Final  . Potassium 10/11/2013 3.8  3.5 - 5.1 mEq/L Final  . Chloride 10/11/2013 100  96 - 112 mEq/L Final  . CO2 10/11/2013 28  19 - 32 mEq/L Final  . Glucose, Bld 10/11/2013 148* 70 - 99 mg/dL Final  . BUN 10/11/2013 12  6 - 23 mg/dL Final  . Creatinine, Ser 10/11/2013 0.9  0.4 - 1.2 mg/dL Final  . Calcium 10/11/2013 9.0  8.4 - 10.5 mg/dL Final  . GFR 10/11/2013 89.85  >60.00 mL/min Final   Msg sent: Dear Cassidy [name], The HbA1c is higher >> let's see how Invokana helps. The bad cholesterol is higher, too, at 119. The kidney function is good. Sincerely, Philemon Kingdom MD

## 2013-10-11 NOTE — Patient Instructions (Addendum)
Please continue: - Levemir 35 units in am >> move this to bedtime - Novolog 15 units 3x a day - NovoLog SSI:  - 150-175: + 1 unit  - 176-200: + 2 units  - 201-225: + 3 units  - 226-250: + 4 units  - >250: + 5 units Please add Invokana 100 mg in am.  Please stop at the lab.  Please return in 1.5 month with your sugar log.

## 2013-10-16 ENCOUNTER — Other Ambulatory Visit: Payer: Self-pay | Admitting: Internal Medicine

## 2013-10-23 ENCOUNTER — Ambulatory Visit: Payer: Medicaid Other | Admitting: Cardiology

## 2013-10-26 ENCOUNTER — Other Ambulatory Visit: Payer: Self-pay

## 2013-11-02 DIAGNOSIS — I2510 Atherosclerotic heart disease of native coronary artery without angina pectoris: Secondary | ICD-10-CM | POA: Diagnosis present

## 2013-11-02 DIAGNOSIS — Z9861 Coronary angioplasty status: Secondary | ICD-10-CM

## 2013-11-06 ENCOUNTER — Encounter: Payer: Self-pay | Admitting: Cardiology

## 2013-11-06 ENCOUNTER — Encounter (HOSPITAL_COMMUNITY): Payer: Self-pay | Admitting: Pharmacy Technician

## 2013-11-06 ENCOUNTER — Ambulatory Visit (INDEPENDENT_AMBULATORY_CARE_PROVIDER_SITE_OTHER): Payer: Medicaid Other | Admitting: Cardiology

## 2013-11-06 VITALS — BP 120/80 | HR 88 | Ht 67.5 in | Wt 251.0 lb

## 2013-11-06 DIAGNOSIS — I2510 Atherosclerotic heart disease of native coronary artery without angina pectoris: Secondary | ICD-10-CM

## 2013-11-06 DIAGNOSIS — I10 Essential (primary) hypertension: Secondary | ICD-10-CM

## 2013-11-06 DIAGNOSIS — Z9861 Coronary angioplasty status: Secondary | ICD-10-CM

## 2013-11-06 DIAGNOSIS — E785 Hyperlipidemia, unspecified: Secondary | ICD-10-CM

## 2013-11-06 DIAGNOSIS — Z720 Tobacco use: Secondary | ICD-10-CM

## 2013-11-06 DIAGNOSIS — E1159 Type 2 diabetes mellitus with other circulatory complications: Secondary | ICD-10-CM

## 2013-11-06 DIAGNOSIS — Z01818 Encounter for other preprocedural examination: Secondary | ICD-10-CM

## 2013-11-06 DIAGNOSIS — I200 Unstable angina: Secondary | ICD-10-CM

## 2013-11-06 DIAGNOSIS — D689 Coagulation defect, unspecified: Secondary | ICD-10-CM

## 2013-11-06 NOTE — Patient Instructions (Signed)
Your physician has requested that you have a cardiac catheterization. Cardiac catheterization is used to diagnose and/or treat various heart conditions. Doctors may recommend this procedure for a number of different reasons. The most common reason is to evaluate chest pain. Chest pain can be a symptom of coronary artery disease (CAD), and cardiac catheterization can show whether plaque is narrowing or blocking your heart's arteries. This procedure is also used to evaluate the valves, as well as measure the blood flow and oxygen levels in different parts of your heart. For further information please visit [email or web address]. Please follow instruction sheet, as given.  Please have labs done today PTT, PT, CBC, BMP, TSH

## 2013-11-07 ENCOUNTER — Encounter: Payer: Self-pay | Admitting: Cardiology

## 2013-11-07 ENCOUNTER — Other Ambulatory Visit: Payer: Self-pay | Admitting: Cardiovascular Disease

## 2013-11-07 ENCOUNTER — Other Ambulatory Visit: Payer: Self-pay | Admitting: *Deleted

## 2013-11-07 DIAGNOSIS — Z01818 Encounter for other preprocedural examination: Secondary | ICD-10-CM

## 2013-11-07 LAB — CBC
HCT: 38.7 % (ref 36.0–46.0)
Hemoglobin: 13.1 g/dL (ref 12.0–15.0)
MCH: 28.7 pg (ref 26.0–34.0)
MCHC: 33.9 g/dL (ref 30.0–36.0)
MCV: 84.9 fL (ref 78.0–100.0)
PLATELETS: 401 10*3/uL — AB (ref 150–400)
RBC: 4.56 MIL/uL (ref 3.87–5.11)
RDW: 14.6 % (ref 11.5–15.5)
WBC: 10.2 10*3/uL (ref 4.0–10.5)

## 2013-11-07 LAB — BASIC METABOLIC PANEL
BUN: 8 mg/dL (ref 6–23)
CALCIUM: 9 mg/dL (ref 8.4–10.5)
CO2: 24 mEq/L (ref 19–32)
Chloride: 108 mEq/L (ref 96–112)
Creat: 0.65 mg/dL (ref 0.50–1.10)
Glucose, Bld: 68 mg/dL — ABNORMAL LOW (ref 70–99)
Potassium: 4.2 mEq/L (ref 3.5–5.3)
Sodium: 138 mEq/L (ref 135–145)

## 2013-11-07 LAB — PROTIME-INR
INR: 0.99 (ref <1.50)
Prothrombin Time: 13.1 seconds (ref 11.6–15.2)

## 2013-11-07 LAB — TSH: TSH: 1.374 u[IU]/mL (ref 0.350–4.500)

## 2013-11-07 LAB — APTT: aPTT: 35 seconds (ref 24–37)

## 2013-11-07 NOTE — Assessment & Plan Note (Signed)
Currently on insulin. We'll have her take half dose of her long-acting insulin in the evening prior to her cath.

## 2013-11-07 NOTE — Progress Notes (Signed)
PCP: Vonna Drafts., FNP  Clinic Note: Chief Complaint  Patient presents with  . Follow-up    chest pains getting worse and lasting longer, SOB, ankles swell   HPI: Cassidy [name] is a 48 y.o. female with a PMH below who presents today for evaluation of chest pain. She is a very close and so we woman with a long-standing history of hypertension and poorly controlled diabetes mellitus and current history of smoking who had unstable angina in May of 2014 with a positive nuclear stress test. She was taken to cardiac catheterization lab Dr. Shelva Majestic and found to have distal RCA 90-95% extending to the ostium of both the PDA and PLA. She was treated with scoring balloon angioplasty of both ostia, no stents were placed. She has not been seen since a followup visit in July of last year with Dr. Claiborne Billings. She apparently was scheduled to have arterial Dopplers performed and evaluated by Dr. Gwenlyn Found, when he saw the results of these were normal, there was a letter sent her insinuating that she was cleared from a vascular standpoint and did not need to followup. This was misunderstood as being cleared no longer need followup in our clinic. This was not the intention of that letter.  Past Medical History  Diagnosis Date  . Hypertension   . GERD (gastroesophageal reflux disease)   . Breast abscess     recurrent; on right  . High cholesterol   . Type II diabetes mellitus   . Anemia   . Sickle cell trait   . Fibromyalgia   . Genital warts   . Constipation   . Diabetic peripheral neuropathy   . Depression   . Bipolar depression   . PTSD (post-traumatic stress disorder)   . Complication of anesthesia     deep pain in legs and arms for 3 days post anesthesia   . Asthma     seasonal  . Tooth caries     pt. states she will have an extraction in 5/14 on tooth on bottom right  . Pain in limb     LEA VENOUS DUPLEX, 02/05/2009 - no evidence of deep vein thrombosis, Baker's cyst  . Chest pain    2D ECHO, 02/05/2009 - EF >55%,   . CAD (coronary artery disease) 05/2012    angiosculpt cutting balloon of distal RCA and ostium of branch extending into the PLA vessel and PDA branch. residual disease of LAD and LCX non obstructive, EF 60-65%  . Type II or unspecified type diabetes mellitus with neurological manifestations, uncontrolled(250.62) 09/30/2006    Qualifier: Diagnosis of  By: Jobe Igo MD, Shanon Brow     Also noted significant family history for premature CAD. Mother age 90.:  Interval History: She presents today with progressively worsening exertional chest discomfort that started about 2-3 months ago.. She did quite well after cardiac rehabilitation and not had any symptoms, but over the last few months, she's been noticing the exact same chest discomfort symptoms that she had prior to her catheterization and PTCA. Her exercise following her rehabilitation is really limited by significant diabetic neuropathy pains in her feet and hands. She is not able to do that much with activity, but is trying to do routine walking. Unfortunately, she ran out of her carvedilol about a week ago, and does not have up-to-date nitroglycerin sublingual.  She says that she feels a sensation of chest tightness and pressure that radiated up to the neck and jaw. It is relieved with rest, and  occurs now with walking less than 100 feet. The intensity has progressed, and the duration of symptoms lasting even at rest has prolonged.  It is associated with dyspnea but the dyspnea does improve. She notes his abdomen also occurs when she has a sensation stress or with walking up stairs briskly. Walking her slow rate for about 2 miles does not bring in on, but if she increases her case, she will notice it. She has noted some PND episodes but no edema or orthopnea. No palpitations, lightheadedness, dizziness, weakness or syncope/near syncope, TIA/amaurosis fugax symptoms.  ROS: A comprehensive was performed. Review of Systems    Constitutional: Negative for weight loss and malaise/fatigue.  HENT: Negative for hearing loss and nosebleeds.   Respiratory: Negative for cough, shortness of breath and wheezing.   Cardiovascular: Negative for claudication.  Gastrointestinal: Positive for nausea. Negative for heartburn, constipation, blood in stool and melena.  Genitourinary: Negative for dysuria, hematuria and flank pain.  Neurological: Positive for tingling, sensory change and headaches. Negative for dizziness, speech change, focal weakness, seizures and loss of consciousness.       Bilateral hand and feet as well as lower extremity neuropathy  Endo/Heme/Allergies: Does not bruise/bleed easily.  Psychiatric/Behavioral: Negative for depression. The patient is nervous/anxious.   All other systems reviewed and are negative.   Current Outpatient Prescriptions on File Prior to Visit  Medication Sig Dispense Refill  . albuterol (PROVENTIL HFA;VENTOLIN HFA) 108 (90 BASE) MCG/ACT inhaler Inhale 2 puffs into the lungs every 6 (six) hours as needed for wheezing.      . Cyanocobalamin (B-12 PO) Take 400 mcg by mouth daily.       . fluticasone (VERAMYST) 27.5 MCG/SPRAY nasal spray Place 2 sprays into the nose daily as needed (congestion).       . gabapentin (NEURONTIN) 300 MG capsule Take 600 mg by mouth 2 (two) times daily.       . insulin detemir (LEVEMIR) 100 UNIT/ML injection Inject 0.35 mLs (35 Units total) into the skin at bedtime.  20 mL  3  . Insulin Syringe-Needle U-100 30G X 5/16" 1 ML MISC Use as directed 4 times daily for insulin injections.  150 each  prn  . lisinopril (PRINIVIL,ZESTRIL) 10 MG tablet Take 10 mg by mouth daily.      Marland Kitchen omeprazole (PRILOSEC) 20 MG capsule Take 20 mg by mouth daily.      . pregabalin (LYRICA) 75 MG capsule Take 75 mg by mouth 2 (two) times daily.      . traZODone (DESYREL) 100 MG tablet Take 50 mg by mouth at bedtime as needed for sleep.        No current facility-administered medications  on file prior to visit.   ALLERGIES REVIEWED IN EPIC -- no change SOCIAL AND FAMILY HISTORY REVIEWED IN EPIC -- no change  Wt Readings from Last 3 Encounters:  11/06/13 251 lb (113.853 kg)  10/11/13 251 lb (113.853 kg)  03/09/13 248 lb (112.492 kg)   PHYSICAL EXAM BP 120/80  Pulse 88  Ht 5' 7.5" (1.715 m)  Wt 251 lb (113.853 kg)  BMI 38.71 kg/m2 General appearance: alert, cooperative, appears stated age, no distress and moderately obese Neck: no adenopathy, no carotid bruit and no JVD Lungs: clear to auscultation bilaterally, normal percussion bilaterally and non-labored Heart: regular rate and rhythm, S1, S2 normal, no murmur, click, rub or gallop; nondisplaced PMI Abdomen: soft, non-tender; bowel sounds normal; no masses,  no organomegaly; no HJR Extremities: extremities normal, atraumatic,  no cyanosis, and edema  Pulses: 2+ and symmetric; Skin: normal  Neurologic: Mental status: Alert, oriented, thought content appropriate Cranial nerves: normal (II-XII grossly intact)   Adult ECG Report  Rate: 88 ;  Rhythm: normal sinus rhythm  Narrative Interpretation: Normal EKG  Recent Labs:    Lab Results  Component Value Date   CHOL 187 10/11/2013   HDL 43.00 10/11/2013   LDLCALC 119* 10/11/2013   TRIG 125.0 10/11/2013   CHOLHDL 4 10/11/2013   ASSESSMENT / PLAN: Crescendo angina She is now noticing worsening exertional and stress-related symptoms consistent with her previous angina. She is known CAD with prior PTCA. These symptoms are similar now to be considered her anginal equivalent. Initially her symptoms sound more like class II angina, however now they're more consistent with class III progressive/crescendo pattern.  Plan:  Refill beta blocker and nitroglycerin  Schedule for left heart catheterization with coronary angiography plus or minus PCI.  Continue aspirin Plavix, beta blocker and statin.  The procedure with Risks/Benefits/Alternatives and Indications was reviewed  with the patient.  All questions were answered.    Risks / Complications include, but not limited to: Death, MI, CVA/TIA, VF/VT (with defibrillation), Bradycardia (need for temporary pacer placement), contrast induced nephropathy, bleeding / bruising / hematoma / pseudoaneurysm, vascular or coronary injury (with possible emergent CT or Vascular Surgery), adverse medication reactions, infection.    The patient voices understanding and agree to proceed.     Unstable angina pectoris Synonymous crescendo angina.  Plan: LHC-Angio +/- PCI.  Essential hypertension Well-controlled on beta blocker and his Imdur. She is now out of her beta blocker, we will then refill it.  Hyperlipidemia with target LDL less than 70 On statin. Most recent labs showed LDL of 119. We'll likely increase to 40 mg per statin post catheterization.  CAD S/P percutaneous coronary angioplasty On beta blocker, aspirin and Plavix a statin. Now with recurrent symptoms consistent with previous angina. I'm concerned that she may very well have progression of her RCA disease or even her LAD/circumflex disease noted previously. Her glycemic control has not been great.  Type 2 diabetes mellitus with circulatory disorder - CAD Currently on insulin. We'll have her take half dose of her long-acting insulin in the evening prior to her cath.  Tobacco abuse  She quit last year, but is now back smoking. I counseled her briefly, but the objective of meeting was more related to referral for cardiac catheterization.    Orders Placed This Encounter  Procedures  . APTT  . CBC  . Protime-INR  . TSH  . Basic metabolic panel  . EKG 12-Lead  . LEFT HEART CATHETERIZATION WITH CORONARY ANGIOGRAM   No orders of the defined types were placed in this encounter.    Followup: ~2 weeks with PA/NP post cath.   Leonie Man, M.D., M.S. Interventional Cardiologist   Pager # [phone]

## 2013-11-07 NOTE — Assessment & Plan Note (Signed)
On statin. Most recent labs showed LDL of 119. We'll likely increase to 40 mg per statin post catheterization.

## 2013-11-07 NOTE — Assessment & Plan Note (Signed)
Synonymous crescendo angina.  Plan: LHC-Angio +/- PCI.

## 2013-11-07 NOTE — Assessment & Plan Note (Signed)
On beta blocker, aspirin and Plavix a statin. Now with recurrent symptoms consistent with previous angina. I'm concerned that she may very well have progression of her RCA disease or even her LAD/circumflex disease noted previously. Her glycemic control has not been great.

## 2013-11-07 NOTE — Telephone Encounter (Signed)
Rx was sent to pharmacy electronically. 

## 2013-11-07 NOTE — Assessment & Plan Note (Addendum)
She is now noticing worsening exertional and stress-related symptoms consistent with her previous angina. She is known CAD with prior PTCA. These symptoms are similar now to be considered her anginal equivalent. Initially her symptoms sound more like class II angina, however now they're more consistent with class III progressive/crescendo pattern.  Plan:  Refill beta blocker and nitroglycerin  Schedule for left heart catheterization with coronary angiography plus or minus PCI.  Continue aspirin Plavix, beta blocker and statin.  The procedure with Risks/Benefits/Alternatives and Indications was reviewed with the patient.  All questions were answered.    Risks / Complications include, but not limited to: Death, MI, CVA/TIA, VF/VT (with defibrillation), Bradycardia (need for temporary pacer placement), contrast induced nephropathy, bleeding / bruising / hematoma / pseudoaneurysm, vascular or coronary injury (with possible emergent CT or Vascular Surgery), adverse medication reactions, infection.    The patient voices understanding and agree to proceed.

## 2013-11-07 NOTE — Assessment & Plan Note (Signed)
Well-controlled on beta blocker and his Imdur. She is now out of her beta blocker, we will then refill it.

## 2013-11-07 NOTE — Assessment & Plan Note (Signed)
" >>  ASSESSMENT AND PLAN FOR UNSTABLE ANGINA PECTORIS (HCC) WRITTEN ON 11/07/2013  7:15 PM BY Charnese Federici, ALM ORN, MD  Synonymous crescendo angina.  Plan: LHC-Angio +/- PCI. "

## 2013-11-07 NOTE — Assessment & Plan Note (Signed)
She quit last year, but is now back smoking. I counseled her briefly, but the objective of meeting was more related to referral for cardiac catheterization.

## 2013-11-08 ENCOUNTER — Encounter (HOSPITAL_COMMUNITY)
Admission: RE | Disposition: A | Discharge status: Home / Self Care | Payer: Self-pay | Source: Ambulatory Visit | Attending: Cardiology

## 2013-11-08 ENCOUNTER — Ambulatory Visit (HOSPITAL_COMMUNITY)
Admission: RE | Admit: 2013-11-08 | Discharge: 2013-11-09 | Disposition: A | Discharge status: Home / Self Care | Payer: Medicaid Other | Source: Ambulatory Visit | Attending: Cardiology | Admitting: Cardiology

## 2013-11-08 ENCOUNTER — Encounter (HOSPITAL_COMMUNITY): Payer: Self-pay | Admitting: General Practice

## 2013-11-08 DIAGNOSIS — I25118 Atherosclerotic heart disease of native coronary artery with other forms of angina pectoris: Secondary | ICD-10-CM

## 2013-11-08 DIAGNOSIS — I25110 Atherosclerotic heart disease of native coronary artery with unstable angina pectoris: Secondary | ICD-10-CM | POA: Diagnosis not present

## 2013-11-08 DIAGNOSIS — E1159 Type 2 diabetes mellitus with other circulatory complications: Secondary | ICD-10-CM

## 2013-11-08 DIAGNOSIS — E7800 Pure hypercholesterolemia, unspecified: Secondary | ICD-10-CM

## 2013-11-08 DIAGNOSIS — I25119 Atherosclerotic heart disease of native coronary artery with unspecified angina pectoris: Secondary | ICD-10-CM | POA: Diagnosis present

## 2013-11-08 DIAGNOSIS — Z955 Presence of coronary angioplasty implant and graft: Secondary | ICD-10-CM | POA: Diagnosis not present

## 2013-11-08 DIAGNOSIS — K219 Gastro-esophageal reflux disease without esophagitis: Secondary | ICD-10-CM | POA: Diagnosis not present

## 2013-11-08 DIAGNOSIS — I200 Unstable angina: Secondary | ICD-10-CM

## 2013-11-08 DIAGNOSIS — Z01818 Encounter for other preprocedural examination: Secondary | ICD-10-CM

## 2013-11-08 DIAGNOSIS — I209 Angina pectoris, unspecified: Secondary | ICD-10-CM | POA: Diagnosis present

## 2013-11-08 DIAGNOSIS — M797 Fibromyalgia: Secondary | ICD-10-CM | POA: Diagnosis not present

## 2013-11-08 DIAGNOSIS — Z794 Long term (current) use of insulin: Secondary | ICD-10-CM | POA: Diagnosis not present

## 2013-11-08 DIAGNOSIS — I10 Essential (primary) hypertension: Secondary | ICD-10-CM | POA: Diagnosis present

## 2013-11-08 DIAGNOSIS — Z8249 Family history of ischemic heart disease and other diseases of the circulatory system: Secondary | ICD-10-CM

## 2013-11-08 DIAGNOSIS — Z9861 Coronary angioplasty status: Secondary | ICD-10-CM

## 2013-11-08 DIAGNOSIS — F3130 Bipolar disorder, current episode depressed, mild or moderate severity, unspecified: Secondary | ICD-10-CM | POA: Insufficient documentation

## 2013-11-08 DIAGNOSIS — F4310 Post-traumatic stress disorder, unspecified: Secondary | ICD-10-CM | POA: Diagnosis present

## 2013-11-08 DIAGNOSIS — E1121 Type 2 diabetes mellitus with diabetic nephropathy: Secondary | ICD-10-CM

## 2013-11-08 DIAGNOSIS — E785 Hyperlipidemia, unspecified: Secondary | ICD-10-CM | POA: Diagnosis present

## 2013-11-08 DIAGNOSIS — F17210 Nicotine dependence, cigarettes, uncomplicated: Secondary | ICD-10-CM | POA: Insufficient documentation

## 2013-11-08 DIAGNOSIS — Z79899 Other long term (current) drug therapy: Secondary | ICD-10-CM | POA: Diagnosis not present

## 2013-11-08 DIAGNOSIS — E1142 Type 2 diabetes mellitus with diabetic polyneuropathy: Secondary | ICD-10-CM | POA: Diagnosis not present

## 2013-11-08 DIAGNOSIS — F319 Bipolar disorder, unspecified: Secondary | ICD-10-CM | POA: Diagnosis present

## 2013-11-08 DIAGNOSIS — Z720 Tobacco use: Secondary | ICD-10-CM | POA: Diagnosis present

## 2013-11-08 DIAGNOSIS — I2510 Atherosclerotic heart disease of native coronary artery without angina pectoris: Secondary | ICD-10-CM | POA: Diagnosis present

## 2013-11-08 HISTORY — DX: Tobacco use: Z72.0

## 2013-11-08 HISTORY — PX: LEFT HEART CATHETERIZATION WITH CORONARY ANGIOGRAM: SHX5451

## 2013-11-08 HISTORY — DX: Hyperlipidemia, unspecified: E78.5

## 2013-11-08 LAB — GLUCOSE, CAPILLARY
GLUCOSE-CAPILLARY: 178 mg/dL — AB (ref 70–99)
Glucose-Capillary: 115 mg/dL — ABNORMAL HIGH (ref 70–99)
Glucose-Capillary: 98 mg/dL (ref 70–99)

## 2013-11-08 LAB — POCT ACTIVATED CLOTTING TIME
ACTIVATED CLOTTING TIME: 208 s
ACTIVATED CLOTTING TIME: 450 s
Activated Clotting Time: 225 seconds

## 2013-11-08 SURGERY — LEFT HEART CATHETERIZATION WITH CORONARY ANGIOGRAM
Anesthesia: LOCAL

## 2013-11-08 MED ORDER — INSULIN DETEMIR 100 UNIT/ML ~~LOC~~ SOLN
35.0000 [IU] | Freq: Every day | SUBCUTANEOUS | Status: DC
  Administered 2013-11-08: 22:00:00 35 [IU] via SUBCUTANEOUS
  Filled 2013-11-08 (×2): qty 0.35

## 2013-11-08 MED ORDER — ACETAMINOPHEN 325 MG PO TABS: 650.0000 mg | ORAL_TABLET | ORAL | Status: DC | PRN

## 2013-11-08 MED ORDER — FLUCONAZOLE 150 MG PO TABS
150.0000 mg | ORAL_TABLET | ORAL | Status: DC | PRN
  Filled 2013-11-08: qty 1

## 2013-11-08 MED ORDER — SODIUM CHLORIDE 0.9 % IJ SOLN: 3.0000 mL | Freq: Two times a day (BID) | INTRAMUSCULAR | Status: DC

## 2013-11-08 MED ORDER — PREGABALIN 25 MG PO CAPS
75.0000 mg | ORAL_CAPSULE | Freq: Two times a day (BID) | ORAL | Status: DC
Start: 2013-11-08 — End: 2013-11-09
  Administered 2013-11-08 – 2013-11-09 (×3): 75 mg via ORAL
  Filled 2013-11-08 (×3): qty 3

## 2013-11-08 MED ORDER — SODIUM CHLORIDE 0.9 % IV SOLN
INTRAVENOUS | Status: DC
  Administered 2013-11-08: 10:00:00 via INTRAVENOUS

## 2013-11-08 MED ORDER — HEPARIN (PORCINE) IN NACL 2-0.9 UNIT/ML-% IJ SOLN
INTRAMUSCULAR | Status: AC
  Filled 2013-11-08: qty 1000

## 2013-11-08 MED ORDER — NITROGLYCERIN 1 MG/10 ML FOR IR/CATH LAB
INTRA_ARTERIAL | Status: AC
  Filled 2013-11-08: qty 10

## 2013-11-08 MED ORDER — SODIUM CHLORIDE 0.9 % IV SOLN
INTRAVENOUS | Status: AC
  Administered 2013-11-08: 17:00:00 via INTRAVENOUS

## 2013-11-08 MED ORDER — GABAPENTIN 300 MG PO CAPS
600.0000 mg | ORAL_CAPSULE | Freq: Two times a day (BID) | ORAL | Status: DC
  Administered 2013-11-08 – 2013-11-09 (×3): 600 mg via ORAL
  Filled 2013-11-08 (×4): qty 2

## 2013-11-08 MED ORDER — CLOPIDOGREL BISULFATE 75 MG PO TABS
75.0000 mg | ORAL_TABLET | Freq: Every day | ORAL | Status: DC
Start: 2013-11-09 — End: 2013-11-09
  Administered 2013-11-09: 75 mg via ORAL
  Filled 2013-11-08: qty 1

## 2013-11-08 MED ORDER — MIDAZOLAM HCL 2 MG/2ML IJ SOLN
INTRAMUSCULAR | Status: AC
  Filled 2013-11-08: qty 2

## 2013-11-08 MED ORDER — PANTOPRAZOLE SODIUM 40 MG PO TBEC
40.0000 mg | DELAYED_RELEASE_TABLET | Freq: Every day | ORAL | Status: DC
  Administered 2013-11-08 – 2013-11-09 (×2): 40 mg via ORAL
  Filled 2013-11-08 (×2): qty 1

## 2013-11-08 MED ORDER — FENTANYL CITRATE 0.05 MG/ML IJ SOLN
INTRAMUSCULAR | Status: AC
  Filled 2013-11-08: qty 2

## 2013-11-08 MED ORDER — OXYCODONE HCL 5 MG PO TABS
10.0000 mg | ORAL_TABLET | Freq: Four times a day (QID) | ORAL | Status: DC | PRN
  Administered 2013-11-08 – 2013-11-09 (×3): 10 mg via ORAL
  Filled 2013-11-08 (×3): qty 2

## 2013-11-08 MED ORDER — VERAPAMIL HCL 2.5 MG/ML IV SOLN
INTRAVENOUS | Status: AC
  Filled 2013-11-08: qty 2

## 2013-11-08 MED ORDER — LIDOCAINE HCL (PF) 1 % IJ SOLN
INTRAMUSCULAR | Status: AC
Start: 2013-11-08 — End: 2013-11-08
  Filled 2013-11-08: qty 30

## 2013-11-08 MED ORDER — LISINOPRIL 10 MG PO TABS
10.0000 mg | ORAL_TABLET | Freq: Every day | ORAL | Status: DC
  Administered 2013-11-09: 10 mg via ORAL
  Filled 2013-11-08 (×2): qty 1

## 2013-11-08 MED ORDER — TRAZODONE HCL 50 MG PO TABS
50.0000 mg | ORAL_TABLET | Freq: Every evening | ORAL | Status: DC | PRN
  Filled 2013-11-08: qty 1

## 2013-11-08 MED ORDER — ADENOSINE 12 MG/4ML IV SOLN
16.0000 mL | INTRAVENOUS | Status: DC
  Filled 2013-11-08: qty 16

## 2013-11-08 MED ORDER — HEPARIN SODIUM (PORCINE) 1000 UNIT/ML IJ SOLN
INTRAMUSCULAR | Status: AC
  Filled 2013-11-08: qty 1

## 2013-11-08 MED ORDER — ASPIRIN EC 81 MG PO TBEC
81.0000 mg | DELAYED_RELEASE_TABLET | Freq: Every day | ORAL | Status: DC
  Filled 2013-11-08: qty 1

## 2013-11-08 MED ORDER — ADENOSINE (DIAGNOSTIC) 3 MG/ML IV SOLN
20.0000 mL | Freq: Once | INTRAVENOUS | Status: DC
  Filled 2013-11-08: qty 20

## 2013-11-08 MED ORDER — METFORMIN HCL ER 500 MG PO TB24
500.0000 mg | ORAL_TABLET | Freq: Every day | ORAL | Status: DC
  Filled 2013-11-08 (×2): qty 1

## 2013-11-08 MED ORDER — ONDANSETRON HCL 4 MG/2ML IJ SOLN: 4.0000 mg | Freq: Four times a day (QID) | INTRAMUSCULAR | Status: DC | PRN

## 2013-11-08 MED ORDER — CARVEDILOL 6.25 MG PO TABS
6.2500 mg | ORAL_TABLET | Freq: Two times a day (BID) | ORAL | Status: DC
  Administered 2013-11-08 – 2013-11-09 (×2): 6.25 mg via ORAL
  Filled 2013-11-08 (×4): qty 1

## 2013-11-08 MED ORDER — INSULIN ASPART 100 UNIT/ML ~~LOC~~ SOLN
15.0000 [IU] | Freq: Three times a day (TID) | SUBCUTANEOUS | Status: DC
Start: 2013-11-08 — End: 2013-11-09
  Administered 2013-11-08 – 2013-11-09 (×2): 15 [IU] via SUBCUTANEOUS

## 2013-11-08 MED ORDER — ADENOSINE 12 MG/4ML IV SOLN
16.0000 mL | Freq: Once | INTRAVENOUS | Status: DC
  Filled 2013-11-08: qty 16

## 2013-11-08 MED ORDER — MORPHINE SULFATE 2 MG/ML IJ SOLN
2.0000 mg | INTRAMUSCULAR | Status: DC | PRN
  Administered 2013-11-08 (×2): 2 mg via INTRAVENOUS
  Filled 2013-11-08 (×2): qty 1

## 2013-11-08 MED ORDER — ALBUTEROL SULFATE (2.5 MG/3ML) 0.083% IN NEBU: 2.5000 mg | INHALATION_SOLUTION | Freq: Four times a day (QID) | RESPIRATORY_TRACT | Status: DC | PRN

## 2013-11-08 NOTE — H&P (View-Only) (Signed)
PCP: Vonna Drafts., FNP  Clinic Note: Chief Complaint  Patient presents with  . Follow-up    chest pains getting worse and lasting longer, SOB, ankles swell   HPI: Cassidy [name] is a 48 y.o. female with a PMH below who presents today for evaluation of chest pain. She is a very close and so we woman with a long-standing history of hypertension and poorly controlled diabetes mellitus and current history of smoking who had unstable angina in May of 2014 with a positive nuclear stress test. She was taken to cardiac catheterization lab Dr. Shelva Majestic and found to have distal RCA 90-95% extending to the ostium of both the PDA and PLA. She was treated with scoring balloon angioplasty of both ostia, no stents were placed. She has not been seen since a followup visit in July of last year with Dr. Claiborne Billings. She apparently was scheduled to have arterial Dopplers performed and evaluated by Dr. Gwenlyn Found, when he saw the results of these were normal, there was a letter sent her insinuating that she was cleared from a vascular standpoint and did not need to followup. This was misunderstood as being cleared no longer need followup in our clinic. This was not the intention of that letter.  Past Medical History  Diagnosis Date  . Hypertension   . GERD (gastroesophageal reflux disease)   . Breast abscess     recurrent; on right  . High cholesterol   . Type II diabetes mellitus   . Anemia   . Sickle cell trait   . Fibromyalgia   . Genital warts   . Constipation   . Diabetic peripheral neuropathy   . Depression   . Bipolar depression   . PTSD (post-traumatic stress disorder)   . Complication of anesthesia     deep pain in legs and arms for 3 days post anesthesia   . Asthma     seasonal  . Tooth caries     pt. states she will have an extraction in 5/14 on tooth on bottom right  . Pain in limb     LEA VENOUS DUPLEX, 02/05/2009 - no evidence of deep vein thrombosis, Baker's cyst  . Chest pain    2D ECHO, 02/05/2009 - EF >55%,   . CAD (coronary artery disease) 05/2012    angiosculpt cutting balloon of distal RCA and ostium of branch extending into the PLA vessel and PDA branch. residual disease of LAD and LCX non obstructive, EF 60-65%  . Type II or unspecified type diabetes mellitus with neurological manifestations, uncontrolled(250.62) 09/30/2006    Qualifier: Diagnosis of  By: Jobe Igo MD, Shanon Brow     Also noted significant family history for premature CAD. Mother age 51.:  Interval History: She presents today with progressively worsening exertional chest discomfort that started about 2-3 months ago.. She did quite well after cardiac rehabilitation and not had any symptoms, but over the last few months, she's been noticing the exact same chest discomfort symptoms that she had prior to her catheterization and PTCA. Her exercise following her rehabilitation is really limited by significant diabetic neuropathy pains in her feet and hands. She is not able to do that much with activity, but is trying to do routine walking. Unfortunately, she ran out of her carvedilol about a week ago, and does not have up-to-date nitroglycerin sublingual.  She says that she feels a sensation of chest tightness and pressure that radiated up to the neck and jaw. It is relieved with rest, and  occurs now with walking less than 100 feet. The intensity has progressed, and the duration of symptoms lasting even at rest has prolonged.  It is associated with dyspnea but the dyspnea does improve. She notes his abdomen also occurs when she has a sensation stress or with walking up stairs briskly. Walking her slow rate for about 2 miles does not bring in on, but if she increases her case, she will notice it. She has noted some PND episodes but no edema or orthopnea. No palpitations, lightheadedness, dizziness, weakness or syncope/near syncope, TIA/amaurosis fugax symptoms.  ROS: A comprehensive was performed. Review of Systems    Constitutional: Negative for weight loss and malaise/fatigue.  HENT: Negative for hearing loss and nosebleeds.   Respiratory: Negative for cough, shortness of breath and wheezing.   Cardiovascular: Negative for claudication.  Gastrointestinal: Positive for nausea. Negative for heartburn, constipation, blood in stool and melena.  Genitourinary: Negative for dysuria, hematuria and flank pain.  Neurological: Positive for tingling, sensory change and headaches. Negative for dizziness, speech change, focal weakness, seizures and loss of consciousness.       Bilateral hand and feet as well as lower extremity neuropathy  Endo/Heme/Allergies: Does not bruise/bleed easily.  Psychiatric/Behavioral: Negative for depression. The patient is nervous/anxious.   All other systems reviewed and are negative.   Current Outpatient Prescriptions on File Prior to Visit  Medication Sig Dispense Refill  . albuterol (PROVENTIL HFA;VENTOLIN HFA) 108 (90 BASE) MCG/ACT inhaler Inhale 2 puffs into the lungs every 6 (six) hours as needed for wheezing.      . Cyanocobalamin (B-12 PO) Take 400 mcg by mouth daily.       . fluticasone (VERAMYST) 27.5 MCG/SPRAY nasal spray Place 2 sprays into the nose daily as needed (congestion).       . gabapentin (NEURONTIN) 300 MG capsule Take 600 mg by mouth 2 (two) times daily.       . insulin detemir (LEVEMIR) 100 UNIT/ML injection Inject 0.35 mLs (35 Units total) into the skin at bedtime.  20 mL  3  . Insulin Syringe-Needle U-100 30G X 5/16" 1 ML MISC Use as directed 4 times daily for insulin injections.  150 each  prn  . lisinopril (PRINIVIL,ZESTRIL) 10 MG tablet Take 10 mg by mouth daily.      Marland Kitchen omeprazole (PRILOSEC) 20 MG capsule Take 20 mg by mouth daily.      . pregabalin (LYRICA) 75 MG capsule Take 75 mg by mouth 2 (two) times daily.      . traZODone (DESYREL) 100 MG tablet Take 50 mg by mouth at bedtime as needed for sleep.        No current facility-administered medications  on file prior to visit.   ALLERGIES REVIEWED IN EPIC -- no change SOCIAL AND FAMILY HISTORY REVIEWED IN EPIC -- no change  Wt Readings from Last 3 Encounters:  11/06/13 251 lb (113.853 kg)  10/11/13 251 lb (113.853 kg)  03/09/13 248 lb (112.492 kg)   PHYSICAL EXAM BP 120/80  Pulse 88  Ht 5' 7.5" (1.715 m)  Wt 251 lb (113.853 kg)  BMI 38.71 kg/m2 General appearance: alert, cooperative, appears stated age, no distress and moderately obese Neck: no adenopathy, no carotid bruit and no JVD Lungs: clear to auscultation bilaterally, normal percussion bilaterally and non-labored Heart: regular rate and rhythm, S1, S2 normal, no murmur, click, rub or gallop; nondisplaced PMI Abdomen: soft, non-tender; bowel sounds normal; no masses,  no organomegaly; no HJR Extremities: extremities normal, atraumatic,  no cyanosis, and edema  Pulses: 2+ and symmetric; Skin: normal  Neurologic: Mental status: Alert, oriented, thought content appropriate Cranial nerves: normal (II-XII grossly intact)   Adult ECG Report  Rate: 88 ;  Rhythm: normal sinus rhythm  Narrative Interpretation: Normal EKG  Recent Labs:    Lab Results  Component Value Date   CHOL 187 10/11/2013   HDL 43.00 10/11/2013   LDLCALC 119* 10/11/2013   TRIG 125.0 10/11/2013   CHOLHDL 4 10/11/2013   ASSESSMENT / PLAN: Crescendo angina She is now noticing worsening exertional and stress-related symptoms consistent with her previous angina. She is known CAD with prior PTCA. These symptoms are similar now to be considered her anginal equivalent. Initially her symptoms sound more like class II angina, however now they're more consistent with class III progressive/crescendo pattern.  Plan:  Refill beta blocker and nitroglycerin  Schedule for left heart catheterization with coronary angiography plus or minus PCI.  Continue aspirin Plavix, beta blocker and statin.  The procedure with Risks/Benefits/Alternatives and Indications was reviewed  with the patient.  All questions were answered.    Risks / Complications include, but not limited to: Death, MI, CVA/TIA, VF/VT (with defibrillation), Bradycardia (need for temporary pacer placement), contrast induced nephropathy, bleeding / bruising / hematoma / pseudoaneurysm, vascular or coronary injury (with possible emergent CT or Vascular Surgery), adverse medication reactions, infection.    The patient voices understanding and agree to proceed.     Unstable angina pectoris Synonymous crescendo angina.  Plan: LHC-Angio +/- PCI.  Essential hypertension Well-controlled on beta blocker and his Imdur. She is now out of her beta blocker, we will then refill it.  Hyperlipidemia with target LDL less than 70 On statin. Most recent labs showed LDL of 119. We'll likely increase to 40 mg per statin post catheterization.  CAD S/P percutaneous coronary angioplasty On beta blocker, aspirin and Plavix a statin. Now with recurrent symptoms consistent with previous angina. I'm concerned that she may very well have progression of her RCA disease or even her LAD/circumflex disease noted previously. Her glycemic control has not been great.  Type 2 diabetes mellitus with circulatory disorder - CAD Currently on insulin. We'll have her take half dose of her long-acting insulin in the evening prior to her cath.  Tobacco abuse  She quit last year, but is now back smoking. I counseled her briefly, but the objective of meeting was more related to referral for cardiac catheterization.    Orders Placed This Encounter  Procedures  . APTT  . CBC  . Protime-INR  . TSH  . Basic metabolic panel  . EKG 12-Lead  . LEFT HEART CATHETERIZATION WITH CORONARY ANGIOGRAM   No orders of the defined types were placed in this encounter.    Followup: ~2 weeks with PA/NP post cath.   Leonie Man, M.D., M.S. Interventional Cardiologist   Pager # [phone]

## 2013-11-08 NOTE — Progress Notes (Signed)
TR BAND REMOVAL  LOCATION:  right radial  DEFLATED PER PROTOCOL:  Yes.    TIME BAND OFF / DRESSING APPLIED:   1830   SITE UPON ARRIVAL:   Level 1  SITE AFTER BAND REMOVAL:  Level 1  REVERSE ALLEN'S TEST:    positive  CIRCULATION SENSATION AND MOVEMENT:  Within Normal Limits  Yes.    COMMENTS:  Arrived with hematoma which was pressed out in holding.  Has remained stable, but very tender to palp.

## 2013-11-08 NOTE — CV Procedure (Addendum)
CARDIAC CATHETERIZATION AND PERCUTANEOUS CORONARY INTERVENTION REPORT  NAME:  Cassidy [name]   MRN: [id number] DOB:  1965-05-15   ADMIT DATE: 11/08/2013 Procedure Date: 11/08/2013  INTERVENTIONAL CARDIOLOGIST: Leonie Man, M.D., MS PRIMARY CARE PROVIDER: Vonna Drafts., FNP PRIMARY CARDIOLOGIST: Leonie Man, MD, MS  PATIENT:  Cassidy [name] is a 48 y.o. female with a history of unstable angina in May 2014. She was found to have moderate disease in the LAD and circumflex but with severe disease in the bifurcation of the distal RCA into the RPL and RPDA. She underwent kissing balloon scoring balloon angioplasty with near resolution of the significant stenoses. She did well initially post PCI has not had any problems in the last few months when she started noticing progressively worsening exertional chest tightness and pressure with dyspnea. This symptom was reminiscent of her unstable angina from May 2014. She was seen in clinic on October 27. After long discussion we decided that the best course of action would be to proceed with cardiac catheterization plus minus PCI. If there was a possible lesion that is of concern it could be to study with an on the table FFR.  PRE-OPERATIVE DIAGNOSIS:    Class III Angina  Known CAD  PROCEDURES PERFORMED:    Left Heart Catheterization with Native Coronary Angiography  via RIGHT RADIAL Artery   Left Ventriculography  PROCEDURE: The patient was brought to the [facility] in the fasting state and prepped and draped in the usual sterile fashion for right radial artery access. A modified Allen's test was performed on the right wrist demonstrating excellent collateral flow for radial access.   Sterile technique was used including antiseptics, cap, gloves, gown, hand hygiene, mask and sheet. Skin prep: Chlorhexidine.   Consent: Risks of procedure as well as the alternatives and risks of each were explained  to the (patient/caregiver). Consent for procedure obtained.   Time Out: Verified patient identification, verified procedure, site/side was marked, verified correct patient position, special equipment/implants available, medications/allergies/relevent history reviewed, required imaging and test results available. Performed.  Access:   Right Radial Artery: 6 Fr Sheath -  Modified Seldinger Technique (Micropuncture Kit)  Several attempts were made, because the Angiocath catheter and needle both with clot prior to be held advanced the wire. Finally with the standard micropuncture puncture needle without Angiocath, access was obtained.  Radial Cocktail - 10 mL; IV Heparin 6500 Units   Left Heart Catheterization: 5Fr Catheters advanced or exchanged over a long exchange safety J-wire; TIG 4.0 catheter advanced first.  Left& Right Coronary Artery Cineangiography: TIG 4.0 Catheter   LV Hemodynamics: TIG 4.0  Sheath removed in the cardiac catheterization lab with VASC Band application for hemostasis.  VASC Band: 1325  Hours; 14 mL air --> after initial placement, there was a upstream hematoma noted. After hemostasis was obtained in the band was advanced more proximally to cover this spot of the hematoma. The site was stable upon leaving the Cath Lab.  FINDINGS:  Hemodynamics:   Central Aortic Pressure / Mean: 131/77/101 mmHg  Left Ventricular Pressure / LVEDP: 130/9/24 mmHg  Left Ventriculography: Not performed  Coronary Anatomy:  Dominance: Right  Left Main: Large-caliber vessel that Branches into the Circumflex, and the LAD with several small ramus intermedius branches. Angiographic normal. LAD: Normal caliber vessel that courses down around the apex perfuse the inferoapex. In the midportion just after septal perforator there is a roughly 70-80% eccentric lesion. The remainder the vessel is relatively  free of disease. There are several small diagonal branches but none that are significant  enough to mention. No significant diseases in them.  Left Circumflex: Large caliber, nondominant vessel that trifurcates in the mid vessel into OM 1, OM 2 and the AV groove circumflex which was courses into LPL 1. At the trifurcation there is a 60-70% stenosis that involves the 2 side branches.  OM1: Small moderate caliber vessel, tortuous but free of disease.  OM 2: Large-caliber lateral OM that has several small branches. It course along with inferoapex. Angiographic normal. Ramus intermedius: 2 small branches that course along the anterolateral wall. Minimal luminal irregular days.  RCA: Normal caliber vessel with maybe 20% proximal stenosis as well as 20-30% mid stenosis at the crux.  The vessel bifurcates into the Right Posterior AV Groove Branch (RPAV) and the Right Posterior Descending Artery (RPDA). This is a site of previous PTCA with there is only minimal luminal irregularities involving the ostia of both branches.  RPDA: Small moderate caliber vessel that does not reach the apex. Relatively normal.  RPL Sysytem:The RPAV small moderate caliber vessel that gives off 2 small posterolateral branches.  After reviewing the initial angiography, the culprit lesion was thought to be the Mid LAD &/or the Mid Circumflex lesion.  Preparation were made to proceed with FFR Guided PCI on these lesions   Fractional Flow Reserve Measurement:  Sheath exchanged for 6 Fr  Guide: 6 Fr   XB LAD  Fractional Flow Reserve Measurement:  Lesion 1: Mid circumflex trifurcation lesion, 60-70%  Volcano FFR wire was advanced beyond the trifurcation lesion.  Adenosine infusion at 140 mcg/kg/m was infused for a total of 2 minutes.  Starting FFR 0.94 Sec. --> Closing 0.86 Sec, not physiologically significant  Lesion #2: Mid LAD 70- 80% Initially, the plan was to use the Acist FFR catheter, however this could not be normalized. A BMW wire had been advanced down the LAD that was removed for the [facility]  wire.  Volcano FFR wire was advanced beyond the mid LAD lesion.  Adenosine was infused at the rate noted above.  Starting FFR 0.86 Sec --> closing FFR after 75 seconds 0.73 Sec; angiographically significant  Plans were then made to proceed with PCI on the LAD lesion and medical management for the circumflex complex lesion.  Percutaneous Coronary Intervention:  Predilation Balloon: Emerge 2.5 mm x 15 mm;   10 Atm x 20 Sec,  Stent: Integrity Resolute DES 3.0 mm x 22 mm;   18 Atm x 30 Sec Post-dilation Balloon: [place] Euphora 3.5 mm x 15 mm;   16 Atm x 30 Sec x 2 inflations in the mid and distal stent.   Final Diameter: Proximal stent 3.3 mm, mid and distal stent 3.55 mm   Post deployment angiography in multiple views, with and without guidewire in place revealed excellent stent deployment and lesion coverage.  There was no evidence of dissection or perforation. There is a slight stepup and stepdown on either of the stent.   MEDICATIONS:  Anesthesia:  Local Lidocainto  Sedation: 1g IV Versed, 100g IV fentanyl ;   Omnipaque Contrast:  225 mL  Anticoagulation:  IV Heparin total 13,000 Units   Anti-Platelet Agent:  the patient is on standing Plavix Radial Cocktail: 5 mg Verapamil, 400 mcg NTG, 2 ml 2% Lidocaine in 10 ml NS IC NTG 200 mcg x 1  PATIENT DISPOSITION:    The patient was transferred to the PACU holding area in a hemodynamicaly stable, chest pain  free condition.  The patient tolerated the procedure well, and there were no complications.  EBL:   < 10l  The patient was stable before, during, and after the procedure.  POST-OPERATIVE DIAGNOSIS:    Severe single-vessel disease with a roughly 80% stenosis in the mid LAD, positive FFR of 0.73.  Successful FFR guided PCI of the mid LAD with a Resolute Integrity DES 3.0 mm x 22 mm postdilated to 3.3 mm proximally and 3.55 mm distally.  Moderate disease in the circumflex at the trifurcation with widely patent PTCA site in  the RCA.  Moderately elevated LVEDP with previously normal LVEF not evaluated today.  PLAN OF CARE:  The patient will be admitted to [facility] postprocedure unit for standard post radial PCI care.  Continue dual antiplatelet therapy   Continue cardiac risk factor management. Is on BB & ACE-I.  May be able to up-titrate.  Also, may need to increase statin dose.  Anticipate discharge in the morning, and to follow-up in 2-3 weeks with either me or an APP.     Leonie Man, M.D., M.S. Interventional Cardiologist   Pager # [phone]

## 2013-11-08 NOTE — Care Management Note (Addendum)
  Page 1 of 1   11/08/2013     4:27:28 PM CARE MANAGEMENT NOTE 11/08/2013  Patient:  DEJANIQUE, RUEHL   Account Number:  [id number]  Date Initiated:  11/08/2013  Documentation initiated by:  Sinclair Arrazola  Subjective/Objective Assessment:   CP     Action/Plan:   CM to follow for disposition needs   Anticipated DC Date:  11/09/2013   Anticipated DC Plan:  HOME/SELF CARE         Choice offered to / List presented to:             Status of service:  Completed, signed off Medicare Important Message given?   (If response is "NO", the following Medicare IM given date fields will be blank) Date Medicare IM given:   Medicare IM given by:   Date Additional Medicare IM given:   Additional Medicare IM given by:    Discharge Disposition:  HOME/SELF CARE  Per UR Regulation:    If discussed at Long Length of Stay Meetings, dates discussed:    Comments:  Vanessia Bokhari RN, BSN, MSHL, CCM  Nurse - Case Manager,  ([address])  [phone]  10/292015 PROCEDURES PERFORMED:   Left Heart Catheterization with Native Coronary Angiography  via RIGHT RADIAL Artery  and Left Ventriculography on 11/08/2013 Specialty Med Review:  Plavix 75mg  qd Dispo Plan:  Home / self care

## 2013-11-08 NOTE — Interval H&P Note (Signed)
History and Physical Interval Note:  11/08/2013 11:14 AM  Cassidy [name]  has presented today for surgery, with the diagnosis of Class III Angina.  The various methods of treatment have been discussed with the patient and family. After consideration of risks, benefits and other options for treatment, the patient has consented to  Procedure(s): LEFT HEART CATHETERIZATION WITH CORONARY ANGIOGRAM (N/A) as a surgical intervention .  The patient's history has been reviewed, patient examined, no change in status, stable for surgery.  I have reviewed the patient's chart and labs.  Questions were answered to the patient's satisfaction.    Cath Lab Visit (complete for each Cath Lab visit)  Clinical Evaluation Leading to the Procedure:   ACS: No.  Non-ACS:    Anginal Classification: CCS III  Anti-ischemic medical therapy: Minimal Therapy (1 class of medications)  Non-Invasive Test Results: No non-invasive testing performed  Prior CABG: No previous CABG   HARDING,DAVID W

## 2013-11-09 ENCOUNTER — Encounter (HOSPITAL_COMMUNITY): Payer: Self-pay | Admitting: Physician Assistant

## 2013-11-09 DIAGNOSIS — E785 Hyperlipidemia, unspecified: Secondary | ICD-10-CM | POA: Diagnosis present

## 2013-11-09 DIAGNOSIS — I25110 Atherosclerotic heart disease of native coronary artery with unstable angina pectoris: Secondary | ICD-10-CM | POA: Diagnosis not present

## 2013-11-09 DIAGNOSIS — M797 Fibromyalgia: Secondary | ICD-10-CM | POA: Diagnosis present

## 2013-11-09 DIAGNOSIS — F319 Bipolar disorder, unspecified: Secondary | ICD-10-CM | POA: Diagnosis present

## 2013-11-09 DIAGNOSIS — I200 Unstable angina: Secondary | ICD-10-CM

## 2013-11-09 DIAGNOSIS — E1142 Type 2 diabetes mellitus with diabetic polyneuropathy: Secondary | ICD-10-CM

## 2013-11-09 DIAGNOSIS — K219 Gastro-esophageal reflux disease without esophagitis: Secondary | ICD-10-CM | POA: Diagnosis not present

## 2013-11-09 DIAGNOSIS — Z720 Tobacco use: Secondary | ICD-10-CM

## 2013-11-09 DIAGNOSIS — I10 Essential (primary) hypertension: Secondary | ICD-10-CM | POA: Diagnosis not present

## 2013-11-09 DIAGNOSIS — E1121 Type 2 diabetes mellitus with diabetic nephropathy: Secondary | ICD-10-CM

## 2013-11-09 DIAGNOSIS — F4310 Post-traumatic stress disorder, unspecified: Secondary | ICD-10-CM | POA: Diagnosis present

## 2013-11-09 LAB — BASIC METABOLIC PANEL
Anion gap: 10 (ref 5–15)
BUN: 7 mg/dL (ref 6–23)
CO2: 25 mEq/L (ref 19–32)
Calcium: 8.8 mg/dL (ref 8.4–10.5)
Chloride: 105 mEq/L (ref 96–112)
Creatinine, Ser: 0.72 mg/dL (ref 0.50–1.10)
GFR calc Af Amer: >90 mL/min (ref >90)
GFR calc non Af Amer: >90 mL/min (ref >90)
GLUCOSE: 110 mg/dL — AB (ref 70–99)
Potassium: 4.2 mEq/L (ref 3.7–5.3)
Sodium: 140 mEq/L (ref 137–147)

## 2013-11-09 LAB — CBC
HCT: 35.3 % — ABNORMAL LOW (ref 36.0–46.0)
HEMOGLOBIN: 12.1 g/dL (ref 12.0–15.0)
MCH: 29.2 pg (ref 26.0–34.0)
MCHC: 34.3 g/dL (ref 30.0–36.0)
MCV: 85.3 fL (ref 78.0–100.0)
Platelets: 302 10*3/uL (ref 150–400)
RBC: 4.14 MIL/uL (ref 3.87–5.11)
RDW: 13.6 % (ref 11.5–15.5)
WBC: 8.6 10*3/uL (ref 4.0–10.5)

## 2013-11-09 LAB — GLUCOSE, CAPILLARY: Glucose-Capillary: 219 mg/dL — ABNORMAL HIGH (ref 70–99)

## 2013-11-09 MED ORDER — NITROGLYCERIN 0.4 MG SL SUBL: 0.4000 mg | SUBLINGUAL_TABLET | SUBLINGUAL | Status: DC | PRN

## 2013-11-09 MED ORDER — ATORVASTATIN CALCIUM 40 MG PO TABS
40.0000 mg | ORAL_TABLET | Freq: Every day | ORAL | Status: DC
  Filled 2013-11-09: qty 1

## 2013-11-09 MED ORDER — SIMVASTATIN 40 MG PO TABS: 40.0000 mg | ORAL_TABLET | Freq: Every day | ORAL | Status: DC

## 2013-11-09 MED ORDER — METFORMIN HCL ER 500 MG PO TB24: 500.0000 mg | ORAL_TABLET | Freq: Every day | ORAL | Status: DC

## 2013-11-09 MED ORDER — INSULIN ASPART 100 UNIT/ML ~~LOC~~ SOLN
0.0000 [IU] | Freq: Three times a day (TID) | SUBCUTANEOUS | Status: DC
Start: 2013-11-09 — End: 2013-11-09
  Administered 2013-11-09: 08:00:00 17 [IU] via SUBCUTANEOUS

## 2013-11-09 MED ORDER — SIMVASTATIN 40 MG PO TABS
40.0000 mg | ORAL_TABLET | Freq: Every day | ORAL | Status: DC
  Filled 2013-11-09: qty 1

## 2013-11-09 NOTE — Discharge Instructions (Signed)

## 2013-11-09 NOTE — Discharge Summary (Signed)
Discharge Summary   Patient ID: Cassidy [name] MRN: [id number], DOB/AGE: 1965-05-08 48 y.o. Admit date: 11/08/2013 D/C date:     11/09/2013  Primary Cardiologist: Dr. Ellyn Hack  Principal Problem:   Unstable angina pectoris Active Problems:   GERD   Essential hypertension   Tobacco abuse   Family history of early CAD   CAD (coronary artery disease)   HLD (hyperlipidemia)   Type II diabetes mellitus   Fibromyalgia   Diabetic peripheral neuropathy   Bipolar depression   PTSD (post-traumatic stress disorder)    Admission Dates: 11/08/28-11/09/13 Discharge Diagnosis: [place] s/p DES to mLAD  HPI: Cassidy [name] is a 48 y.o. female with a history of continued tobacco abuse, HTN, CAD s/p PCTA to dRCA and ostium of branch extending into the PLA vessel and PDA branch (05/2012), DM, HLD, PTSD/bipolar, fibromyalgia and GERD who presented to [facility] on 11/08/13 for planned coronary angiography for evaluation of progressively worsening exertional chest discomfort.  She was seen by Dr. Ellyn Hack in the office on 11/06/13 where she complained of progressively worsening exertional chest discomfort that started about 2-3 months ago. She did quite well after cardiac rehabilitation and not had any symptoms, but over the last few months, she's been noticing the exact same chest discomfort symptoms that she had prior to her catheterization and PTCA in 05/2012. She reported a sensation of chest tightness and pressure that radiated up to the neck and jaw. It was relieved with rest and occured with walking less than 100 feet.  Hospital Course  [place]/CAD- s/p LHC on 11/08/13 which revealed  Severe single-vessel disease with a roughly 80% stenosis in the mid LAD, positive FFR of 0.73.  Successful FFR guided PCI of the mid LAD with a Resolute Integrity DES 3.0 mm x 22 mm postdilated to 3.3 mm proximally and 3.55 mm distally.  Moderate disease in the circumflex at the trifurcation with widely patent PTCA site in the  RCA.  Moderately elevated LVEDP with previously normal LVEF not evaluated today. -- Continue DAPT with ASA/Plavix, Coreg 6.35m BID, lisinopril 156mand statin. She has been provided with a refill on her SL NTG -- Radial site stable with small hematoma. Radial pulse 2+  HLD- LDL not at goal. Most recent labs showed LDL of 119 on 10/11/13. Has been on simvastatin 2011mwill increase to 90m60m HTN- continue Coreg 6.25mg39m and lisinopril 10mg 58m- not well controlled. HgA1c 7.5 on 10/11/13  -- Hold metformin for at least 48 hours post contrast dye exposure. Can resume 11/11/13   Tobacco abuse- counseled on cessation   The patient has had an uncomplicated hospital course and is recovering well. The radial catheter site is stable. She has been seen by Dr. VaranaIrish Lack and deemed ready for discharge home. All follow-up appointments have been scheduled. Smoking cessation was disscussed in length. Discharge medications are listed below.   Discharge Vitals: Blood pressure 117/54, pulse 72, temperature 97.8 F (36.6 C), temperature source Oral, resp. rate 18, height 5' 7"  (1.702 m), weight 255 lb 6.4 oz (115.849 kg), SpO2 97.00%.  Labs: Lab Results  Component Value Date   WBC 8.6 11/09/2013   HGB 12.1 11/09/2013   HCT 35.3* 11/09/2013   MCV 85.3 11/09/2013   PLT 302 11/09/2013     Recent Labs Lab 11/09/13 0355  NA 140  K 4.2  CL 105  CO2 25  BUN 7  CREATININE 0.72  CALCIUM 8.8  GLUCOSE 110*  Lab Results  Component Value Date   CHOL 187 10/11/2013   HDL 43.00 10/11/2013   LDLCALC 119* 10/11/2013   TRIG 125.0 10/11/2013     Diagnostic Studies/Procedures    CARDIAC CATHETERIZATION AND PERCUTANEOUS CORONARY INTERVENTION REPORT  NAME: Cassidy [name] MRN: [id number]  DOB: Oct 11, 1965 ADMIT DATE: 11/08/2013  Procedure Date: 11/08/2013  INTERVENTIONAL CARDIOLOGIST: Leonie Man, M.D., MS  PRIMARY CARE PROVIDER: Vonna Drafts., FNP  PRIMARY CARDIOLOGIST:  Leonie Man, MD, MS  PATIENT: Cassidy [name] is a 48 y.o. female with a history of unstable angina in May 2014. She was found to have moderate disease in the LAD and circumflex but with severe disease in the bifurcation of the distal RCA into the RPL and RPDA. She underwent kissing balloon scoring balloon angioplasty with near resolution of the significant stenoses. She did well initially post PCI has not had any problems in the last few months when she started noticing progressively worsening exertional chest tightness and pressure with dyspnea. This symptom was reminiscent of her unstable angina from May 2014. She was seen in clinic on October 27. After long discussion we decided that the best course of action would be to proceed with cardiac catheterization plus minus PCI. If there was a possible lesion that is of concern it could be to study with an on the table FFR.  PRE-OPERATIVE DIAGNOSIS:  Class III Angina  Known CAD PROCEDURES PERFORMED:  Left Heart Catheterization with Native Coronary Angiography via RIGHT RADIAL Artery  Left Ventriculography PROCEDURE: The patient was brought to the [facility] in the fasting state and prepped and draped in the usual sterile fashion for right radial artery access. A modified Allen's test was performed on the right wrist demonstrating excellent collateral flow for radial access. Sterile technique was used including antiseptics, cap, gloves, gown, hand hygiene, mask and sheet. Skin prep: Chlorhexidine.  Consent: Risks of procedure as well as the alternatives and risks of each were explained to the (patient/caregiver). Consent for procedure obtained.  Time Out: Verified patient identification, verified procedure, site/side was marked, verified correct patient position, special equipment/implants available, medications/allergies/relevent history reviewed, required imaging and test results available. Performed.  Access:    Right Radial Artery: 6 Fr Sheath - Modified Seldinger Technique (Micropuncture Kit)  Several attempts were made, because the Angiocath catheter and needle both with clot prior to be held advanced the wire. Finally with the standard micropuncture puncture needle without Angiocath, access was obtained.  Radial Cocktail - 10 mL; IV Heparin 6500 Units  Left Heart Catheterization: 5Fr Catheters advanced or exchanged over a long exchange safety J-wire; TIG 4.0 catheter advanced first.  Left& Right Coronary Artery Cineangiography: TIG 4.0 Catheter  LV Hemodynamics: TIG 4.0  Sheath removed in the cardiac catheterization lab with VASC Band application for hemostasis.  VASC Band: 1325 Hours; 14 mL air --> after initial placement, there was a upstream hematoma noted. After hemostasis was obtained in the band was advanced more proximally to cover this spot of the hematoma. The site was stable upon leaving the Cath Lab.  FINDINGS:  Hemodynamics:  Central Aortic Pressure / Mean: 131/77/101 mmHg  Left Ventricular Pressure / LVEDP: 130/9/24 mmHg Left Ventriculography: Not performed  Coronary Anatomy:  Dominance: Right  Left Main: Large-caliber vessel that Branches into the Circumflex, and the LAD with several small ramus intermedius branches. Angiographic normal. LAD: Normal caliber vessel that courses down around the apex perfuse the inferoapex. In the midportion just after septal  perforator there is a roughly 70-80% eccentric lesion. The remainder the vessel is relatively free of disease. There are several small diagonal branches but none that are significant enough to mention. No significant diseases in them.  Left Circumflex: Large caliber, nondominant vessel that trifurcates in the mid vessel into OM 1, OM 2 and the AV groove circumflex which was courses into LPL 1. At the trifurcation there is a 60-70% stenosis that involves the 2 side branches.  OM1: Small moderate caliber vessel, tortuous but free of  disease.  OM 2: Large-caliber lateral OM that has several small branches. It course along with inferoapex. Angiographic normal. Ramus intermedius: 2 small branches that course along the anterolateral wall. Minimal luminal irregular days.  RCA: Normal caliber vessel with maybe 20% proximal stenosis as well as 20-30% mid stenosis at the crux. The vessel bifurcates into the Right Posterior AV Groove Branch (RPAV) and the Right Posterior Descending Artery (RPDA). This is a site of previous PTCA with there is only minimal luminal irregularities involving the ostia of both branches.  RPDA: Small moderate caliber vessel that does not reach the apex. Relatively normal.  RPL Sysytem:The RPAV small moderate caliber vessel that gives off 2 small posterolateral branches. After reviewing the initial angiography, the culprit lesion was thought to be the Mid LAD &/or the Mid Circumflex lesion. Preparation were made to proceed with FFR Guided PCI on these lesions  Fractional Flow Reserve Measurement: Sheath exchanged for 6 Fr  Guide: 6 Fr XB LAD  Fractional Flow Reserve Measurement:  Lesion 1: Mid circumflex trifurcation lesion, 60-70%  [place] FFR wire was advanced beyond the trifurcation lesion.  Adenosine infusion at 140 mcg/kg/m was infused for a total of 2 minutes.  Starting FFR 0.94 Sec. --> Closing 0.86 Sec, not physiologically significant Lesion #2: Mid LAD 70- 80%  Initially, the plan was to use the Acist FFR catheter, however this could not be normalized. A BMW wire had been advanced down the LAD that was removed for the [facility] wire.  Volcano FFR wire was advanced beyond the mid LAD lesion.  Adenosine was infused at the rate noted above.  Starting FFR 0.86 Sec --> closing FFR after 75 seconds 0.73 Sec; angiographically significant  Plans were then made to proceed with PCI on the LAD lesion and medical management for the circumflex complex lesion. Percutaneous Coronary Intervention:  Predilation  Balloon: Emerge 2.5 mm x 15 mm;  10 Atm x 20 Sec,  Stent: Integrity Resolute DES 3.0 mm x 22 mm;  18 Atm x 30 Sec  Post-dilation Balloon: [place] Euphora 3.5 mm x 15 mm;  16 Atm x 30 Sec x 2 inflations in the mid and distal stent.  Final Diameter: Proximal stent 3.3 mm, mid and distal stent 3.55 mm  Post deployment angiography in multiple views, with and without guidewire in place revealed excellent stent deployment and lesion coverage. There was no evidence of dissection or perforation. There is a slight stepup and stepdown on either of the stent.  MEDICATIONS:  Anesthesia: Local Lidocainto  Sedation: 1g IV Versed, 100g IV fentanyl ;  Omnipaque Contrast: 225 mL  Anticoagulation: IV Heparin total 13,000 Units  Anti-Platelet Agent: the patient is on standing Plavix  Radial Cocktail: 5 mg Verapamil, 400 mcg NTG, 2 ml 2% Lidocaine in 10 ml NS  IC NTG 200 mcg x 1 PATIENT DISPOSITION:  The patient was transferred to the PACU holding area in a hemodynamicaly stable, chest pain free condition.  The patient tolerated the  procedure well, and there were no complications. EBL: < 10l  The patient was stable before, during, and after the procedure. POST-OPERATIVE DIAGNOSIS:  Severe single-vessel disease with a roughly 80% stenosis in the mid LAD, positive FFR of 0.73.  Successful FFR guided PCI of the mid LAD with a Resolute Integrity DES 3.0 mm x 22 mm postdilated to 3.3 mm proximally and 3.55 mm distally.  Moderate disease in the circumflex at the trifurcation with widely patent PTCA site in the RCA.  Moderately elevated LVEDP with previously normal LVEF not evaluated today. PLAN OF CARE:  The patient will be admitted to [facility] postprocedure unit for standard post radial PCI care.  Continue dual antiplatelet therapy  Continue cardiac risk factor management. Is on BB & ACE-I. May be able to up-titrate. Also, may need to increase statin dose.  Anticipate discharge in the morning, and to follow-up in 2-3 weeks  with either me or an APP.   Discharge Medications     Medication List         albuterol 108 (90 BASE) MCG/ACT inhaler  Commonly known as:  PROVENTIL HFA;VENTOLIN HFA  Inhale 2 puffs into the lungs every 6 (six) hours as needed for wheezing.     aspirin EC 81 MG tablet  Take 81 mg by mouth daily.     B-12 PO  Take 400 mcg by mouth daily.     carvedilol 6.25 MG tablet  Commonly known as:  COREG  TAKE 1 TABLET BY MOUTH TWICE DAILY WITH A MEAL.     clopidogrel 75 MG tablet  Commonly known as:  PLAVIX  Take 75 mg by mouth daily.     fluconazole 150 MG tablet  Commonly known as:  DIFLUCAN  Take 150 mg by mouth every three (3) days as needed (yeast infection).     fluticasone 27.5 MCG/SPRAY nasal spray  Commonly known as:  VERAMYST  Place 2 sprays into the nose daily as needed (congestion).     gabapentin 300 MG capsule  Commonly known as:  NEURONTIN  Take 600 mg by mouth 2 (two) times daily.     insulin aspart 100 UNIT/ML injection  Commonly known as:  novoLOG  Inject 15 Units into the skin 3 (three) times daily before meals.     insulin detemir 100 UNIT/ML injection  Commonly known as:  LEVEMIR  Inject 0.35 mLs (35 Units total) into the skin at bedtime.     Insulin Syringe-Needle U-100 30G X 5/16" 1 ML Misc  Use as directed 4 times daily for insulin injections.     INVOKANA 100 MG Tabs tablet  Generic drug:  canagliflozin  Take 100 mg by mouth daily.     lisinopril 10 MG tablet  Commonly known as:  PRINIVIL,ZESTRIL  Take 10 mg by mouth daily.     metFORMIN 500 MG 24 hr tablet  Commonly known as:  GLUCOPHAGE-XR  Take 1 tablet (500 mg total) by mouth daily with breakfast.  Start taking on:  11/11/2013     nitroGLYCERIN 0.4 MG SL tablet  Commonly known as:  NITROSTAT  Place 1 tablet (0.4 mg total) under the tongue every 5 (five) minutes as needed for chest pain.     omeprazole 20 MG capsule  Commonly known as:  PRILOSEC  Take 20 mg by mouth daily.      Oxycodone HCl 10 MG Tabs  Take 10 mg by mouth every 6 (six) hours as needed (pain).     pregabalin 75  MG capsule  Commonly known as:  LYRICA  Take 75 mg by mouth 2 (two) times daily.     simvastatin 40 MG tablet  Commonly known as:  ZOCOR  Take 1 tablet (40 mg total) by mouth daily at 6 PM.     traZODone 100 MG tablet  Commonly known as:  DESYREL  Take 50 mg by mouth at bedtime as needed for sleep.        Disposition   The patient will be discharged in stable condition to home. Discharge Instructions   Amb Referral to Cardiac Rehabilitation    Complete by:  As directed           Follow-up Information   Follow up with Erlene Quan, PA-C On 11/19/2013. (4pm)    Specialty:  Cardiology   Contact information:   [address] [place] [address] [phone]         Duration of Discharge Encounter: Greater than 30 minutes including physician and PA time.  SignedAngelena Form R PA-C 11/09/2013, 9:42 AM   I have examined the patient and reviewed assessment and plan and discussed with patient. Agree with above as stated. Small right radial site hematoma. 2+ right radial pulse. Plan for DAPT for one year. Rx for SL NTG. D/C today if she does ok with cardiac rehab. RF modification.  Ranelle Auker S.

## 2013-11-09 NOTE — Progress Notes (Signed)
CARDIAC REHAB PHASE I   PRE:  Rate/Rhythm: 85 SR  BP:  Supine: 117/54  Sitting:   Standing:    SaO2:   MODE:  Ambulation: 500 ft   POST:  Rate/Rhythm: 85 SR  BP:  Supine:   Sitting: 135/94  Standing:    SaO2:  0820-0903 Pt walked 500 ft with steady gait. No CP. Tolerated well. Education completed with pt who voiced understanding. Re enforced healthy eating habits with watching carbs and fats. Encouraged smoking cessation and pt has handouts. Stated she has fake cigarette also. Going to try nicotine patches. Reviewed NTG use. Pt has attended CRP 2 before and would like to attend again. Will refer to [place].   Graylon Good, RN BSN  11/09/2013 8:59 AM

## 2013-11-09 NOTE — Progress Notes (Signed)
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 Ready to go home. Feeling well.   CURRENT MEDS . aspirin EC  81 mg Oral Daily  . carvedilol  6.25 mg Oral BID WC  . clopidogrel  75 mg Oral Daily  . gabapentin  600 mg Oral BID  . insulin aspart  15 Units Subcutaneous TID AC  . insulin detemir  35 Units Subcutaneous QHS  . lisinopril  10 mg Oral Daily  . metFORMIN  500 mg Oral Q breakfast  . pantoprazole  40 mg Oral Daily  . pregabalin  75 mg Oral BID    OBJECTIVE  Filed Vitals:   11/08/13 1615 11/08/13 1637 11/08/13 2300 11/09/13 0408  BP: 111/71 128/75 99/54 109/72  Pulse: 83 78 78 77  Temp:  98 F (36.7 C) 97.8 F (36.6 C) 97.9 F (36.6 C)  TempSrc:  Oral Oral Oral  Resp:  20 20 20   Height:      Weight:   255 lb 6.4 oz (115.849 kg)   SpO2: 100% 100% 97% 96%    Intake/Output Summary (Last 24 hours) at 11/09/13 0649 Last data filed at 11/09/13 0050  Gross per 24 hour  Intake    440 ml  Output    850 ml  Net   -410 ml   Filed Weights   11/08/13 0856 11/08/13 2300  Weight: 251 lb (113.853 kg) 255 lb 6.4 oz (115.849 kg)    PHYSICAL EXAM  General: Pleasant, NAD. obese Neuro: Alert and oriented X 3. Moves all extremities spontaneously. Psych: Normal affect. HEENT:  Normal  Neck: Supple without bruits or JVD. Lungs:  Resp regular and unlabored, CTA. Heart: RRR no s3, s4, or murmurs. Abdomen: Soft, non-tender, non-distended, BS + x 4.  Extremities: No clubbing, cyanosis or edema. DP/PT/Radials 2+ and equal bilaterally.  Radial site stable with small hematoma.   Accessory Clinical  Findings  CBC  Recent Labs  11/06/13 1520 11/09/13 0355  WBC 10.2 8.6  HGB 13.1 12.1  HCT 38.7 35.3*  MCV 84.9 85.3  PLT 401* 016   Basic Metabolic Panel  Recent Labs  11/06/13 1520 11/09/13 0355  NA 138 140  K 4.2 4.2  CL 108 105  CO2 24 25  GLUCOSE 68* 110*  BUN 8 7  CREATININE 0.65 0.72  CALCIUM 9.0 8.8   Thyroid Function Tests  Recent Labs  11/06/13 1520  TSH 1.374    TELE  NSR, few PVCs  Radiology/Studies   CARDIAC CATHETERIZATION AND PERCUTANEOUS CORONARY INTERVENTION REPORT  NAME: LEETTA HENDRIKS MRN: [id number]  DOB: 1965-12-15 ADMIT DATE: 11/08/2013  Procedure Date: 11/08/2013  INTERVENTIONAL CARDIOLOGIST: Leonie Man, M.D., MS  PRIMARY CARE PROVIDER: Vonna Drafts., FNP  PRIMARY CARDIOLOGIST: Leonie Man, MD, MS  PATIENT: SHATIQUA HEROUX is a 48 y.o. female with a history of unstable angina in May 2014. She was found to have moderate disease in the LAD and circumflex but with severe disease in the bifurcation of the distal RCA into the RPL and RPDA. She underwent kissing balloon scoring balloon angioplasty  with near resolution of the significant stenoses. She did well initially post PCI has not had any problems in the last few months when she started noticing progressively worsening exertional chest tightness and pressure with dyspnea. This symptom was reminiscent of her unstable angina from May 2014. She was seen in clinic on October 27. After long discussion we decided that the best course of action would be to proceed with cardiac catheterization plus minus PCI. If there was a possible lesion that is of concern it could be to study with an on the table FFR.  PRE-OPERATIVE DIAGNOSIS:  Class III Angina  Known CAD PROCEDURES PERFORMED:  Left Heart Catheterization with Native Coronary Angiography via RIGHT RADIAL Artery  Left Ventriculography PROCEDURE: The patient was brought to the 2nd [facility] in the  fasting state and prepped and draped in the usual sterile fashion for right radial artery access. A modified Allen's test was performed on the right wrist demonstrating excellent collateral flow for radial access. Sterile technique was used including antiseptics, cap, gloves, gown, hand hygiene, mask and sheet. Skin prep: Chlorhexidine.  Consent: Risks of procedure as well as the alternatives and risks of each were explained to the (patient/caregiver). Consent for procedure obtained.  Time Out: Verified patient identification, verified procedure, site/side was marked, verified correct patient position, special equipment/implants available, medications/allergies/relevent history reviewed, required imaging and test results available. Performed.  Access:  Right Radial Artery: 6 Fr Sheath - Modified Seldinger Technique (Micropuncture Kit)  Several attempts were made, because the Angiocath catheter and needle both with clot prior to be held advanced the wire. Finally with the standard micropuncture puncture needle without Angiocath, access was obtained.  Radial Cocktail - 10 mL; IV Heparin 6500 Units  Left Heart Catheterization: 5Fr Catheters advanced or exchanged over a long exchange safety J-wire; TIG 4.0 catheter advanced first.  Left& Right Coronary Artery Cineangiography: TIG 4.0 Catheter  LV Hemodynamics: TIG 4.0 Sheath removed in the cardiac catheterization lab with VASC Band application for hemostasis.  VASC Band: 1325 Hours; 14 mL air --> after initial placement, there was a upstream hematoma noted. After hemostasis was obtained in the band was advanced more proximally to cover this spot of the hematoma. The site was stable upon leaving the Cath Lab.  FINDINGS:  Hemodynamics:  Central Aortic Pressure / Mean: 131/77/101 mmHg  Left Ventricular Pressure / LVEDP: 130/9/24 mmHg Left Ventriculography: Not performed  Coronary Anatomy:  Dominance: Right Left Main: Large-caliber vessel that Branches  into the Circumflex, and the LAD with several small ramus intermedius branches. Angiographic normal. LAD: Normal caliber vessel that courses down around the apex perfuse the inferoapex. In the midportion just after septal perforator there is a roughly 70-80% eccentric lesion. The remainder the vessel is relatively free of disease. There are several small diagonal branches but none that are significant enough to mention. No significant diseases in them.  Left Circumflex: Large caliber, nondominant vessel that trifurcates in the mid vessel into OM 1, OM 2 and the AV groove circumflex which was courses into LPL 1. At the trifurcation there is a 60-70% stenosis that involves the 2 side branches.  OM1: Small moderate caliber vessel, tortuous but free of disease.  OM 2: Large-caliber lateral OM that has several small branches. It course along with inferoapex. Angiographic normal. Ramus intermedius: 2 small branches that course along the anterolateral wall. Minimal luminal irregular days.  RCA: Normal caliber vessel with maybe 20% proximal stenosis as well as 20-30% mid  stenosis at the crux. The vessel bifurcates into the Right Posterior AV Groove Branch (RPAV) and the Right Posterior Descending Artery (RPDA). This is a site of previous PTCA with there is only minimal luminal irregularities involving the ostia of both branches.  RPDA: Small moderate caliber vessel that does not reach the apex. Relatively normal.  RPL Sysytem:The RPAV small moderate caliber vessel that gives off 2 small posterolateral branches. After reviewing the initial angiography, the culprit lesion was thought to be the Mid LAD &/or the Mid Circumflex lesion. Preparation were made to proceed with FFR Guided PCI on these lesions  Fractional Flow Reserve Measurement: Sheath exchanged for 6 Fr  Guide: 6 Fr XB LAD  Fractional Flow Reserve Measurement:  Lesion 1: Mid circumflex trifurcation lesion, 60-70%  Volcano FFR wire was advanced beyond  the trifurcation lesion.  Adenosine infusion at 140 mcg/kg/m was infused for a total of 2 minutes.  Starting FFR 0.94 Sec. --> Closing 0.86 Sec, not physiologically significant Lesion #2: Mid LAD 70- 80%  Initially, the plan was to use the Acist FFR catheter, however this could not be normalized. A BMW wire had been advanced down the LAD that was removed for the [facility] wire.  Volcano FFR wire was advanced beyond the mid LAD lesion.  Adenosine was infused at the rate noted above.  Starting FFR 0.86 Sec --> closing FFR after 75 seconds 0.73 Sec; angiographically significant  Plans were then made to proceed with PCI on the LAD lesion and medical management for the circumflex complex lesion. Percutaneous Coronary Intervention:  Predilation Balloon: Emerge 2.5 mm x 15 mm;  10 Atm x 20 Sec,  Stent: Integrity Resolute DES 3.0 mm x 22 mm;  18 Atm x 30 Sec Post-dilation Balloon: [place] Euphora 3.5 mm x 15 mm;  16 Atm x 30 Sec x 2 inflations in the mid and distal stent.  Final Diameter: Proximal stent 3.3 mm, mid and distal stent 3.55 mm  Post deployment angiography in multiple views, with and without guidewire in place revealed excellent stent deployment and lesion coverage. There was no evidence of dissection or perforation. There is a slight stepup and stepdown on either of the stent.  MEDICATIONS:  Anesthesia: Local Lidocainto  Sedation: 1g IV Versed, 100g IV fentanyl ;  Omnipaque Contrast: 225 mL  Anticoagulation: IV Heparin total 13,000 Units  Anti-Platelet Agent: the patient is on standing Plavix Radial Cocktail: 5 mg Verapamil, 400 mcg NTG, 2 ml 2% Lidocaine in 10 ml NS  IC NTG 200 mcg x 1 PATIENT DISPOSITION:  The patient was transferred to the PACU holding area in a hemodynamicaly stable, chest pain free condition.  The patient tolerated the procedure well, and there were no complications. EBL: < 10l  The patient was stable before, during, and after the procedure. POST-OPERATIVE DIAGNOSIS:   Severe single-vessel disease with a roughly 80% stenosis in the mid LAD, positive FFR of 0.73.  Successful FFR guided PCI of the mid LAD with a Resolute Integrity DES 3.0 mm x 22 mm postdilated to 3.3 mm proximally and 3.55 mm distally.  Moderate disease in the circumflex at the trifurcation with widely patent PTCA site in the RCA.  Moderately elevated LVEDP with previously normal LVEF not evaluated today. PLAN OF CARE:  The patient will be admitted to [facility] postprocedure unit for standard post radial PCI care.  Continue dual antiplatelet therapy  Continue cardiac risk factor management. Is on BB & ACE-I. May be able to up-titrate. Also, may need to  increase statin dose.  Anticipate discharge in the morning, and to follow-up in 2-3 weeks with either me or an APP.   ASSESSMENT AND PLAN  INA SCRIVENS is a 48 y.o. female with a history of continued tobacco abuse,  HTN, CAD s/p PCTA to [facility] and ostium of branch extending into the PLA vessel and PDA branch (05/2012), DM, HLD, PTSD/bipolar, fibromyalgia and GERD who presented to [facility] on 11/08/13 for planned coronary angiography for evaluation of progressively worsening exertional chest discomfort.  USA/CAD- s/p LHC on 11/08/13 which revealed Severe single-vessel disease with a roughly 80% stenosis in the mid LAD, positive FFR of 0.73.  Successful FFR guided PCI of the mid LAD with a Resolute Integrity DES 3.0 mm x 22 mm postdilated to 3.3 mm proximally and 3.55 mm distally.  Moderate disease in the circumflex at the trifurcation with widely patent PTCA site in the RCA.  Moderately elevated LVEDP with previously normal LVEF not evaluated today. -- Continue DAPT with ASA/Plavix, Coreg 6.82m BID, lisinopril 167mand Lipitor 4064m-- Radial site stable with small hematoma.   HLD- LDL not at goal. Most recent labs showed LDL of 119 on 10/11/13. Has been on simvastatin 86m32mill increase to 40mg3m HTN- continue Coreg 6.25mg 23mand lisinopril 10mg  66m- not well controlled. HgA1c 7.5 on 10/11/13 --  Hold metformin for at least 48 hours post contrast dye exposure. Can resume 11/11/13  Tobacco abuse- counseled on cessation   Signed, THOMPSOEileen StanfordPager [phone] examined the patient and reviewed assessment and plan and discussed with patient.  Agree with above as stated.  Small right radial site hematoma.  2+ right radial pulse.  Plan for DAPT for one year.  Rx for SL NTG.  D/C today if she does ok with cardiac rehab. RF modification.  Aishah Teffeteller S.

## 2013-11-12 ENCOUNTER — Telehealth: Payer: Self-pay | Admitting: Internal Medicine

## 2013-11-12 ENCOUNTER — Encounter (HOSPITAL_COMMUNITY): Payer: Self-pay | Admitting: Physician Assistant

## 2013-11-12 NOTE — Telephone Encounter (Signed)
Pt had stent done last week FYI

## 2013-11-12 NOTE — Telephone Encounter (Signed)
See note below. Be advised.

## 2013-11-19 ENCOUNTER — Ambulatory Visit: Payer: Medicaid Other | Admitting: Cardiology

## 2013-11-26 ENCOUNTER — Telehealth: Payer: Self-pay | Admitting: Internal Medicine

## 2013-11-26 MED ORDER — GLUCOSE BLOOD VI STRP: ORAL_STRIP | Status: DC

## 2013-11-26 NOTE — Telephone Encounter (Signed)
Patient need refill of Accu check strips

## 2013-11-26 NOTE — Telephone Encounter (Signed)
Called pt to verify type of meter. Refill sent to pt's pharmacy.

## 2013-11-29 ENCOUNTER — Encounter: Payer: Self-pay | Admitting: Cardiology

## 2013-11-29 ENCOUNTER — Ambulatory Visit (INDEPENDENT_AMBULATORY_CARE_PROVIDER_SITE_OTHER): Payer: Medicaid Other | Admitting: Cardiology

## 2013-11-29 VITALS — BP 132/80 | HR 90 | Ht 67.5 in | Wt 256.3 lb

## 2013-11-29 DIAGNOSIS — Z5181 Encounter for therapeutic drug level monitoring: Secondary | ICD-10-CM

## 2013-11-29 DIAGNOSIS — Z7902 Long term (current) use of antithrombotics/antiplatelets: Secondary | ICD-10-CM

## 2013-11-29 DIAGNOSIS — I10 Essential (primary) hypertension: Secondary | ICD-10-CM

## 2013-11-29 LAB — PLATELET INHIBITION P2Y12: Platelet Function  P2Y12: 140 [PRU] — ABNORMAL LOW (ref 194–418)

## 2013-11-29 MED ORDER — ISOSORBIDE MONONITRATE ER 30 MG PO TB24: 15.0000 mg | ORAL_TABLET | Freq: Every day | ORAL | Status: DC

## 2013-11-29 NOTE — Progress Notes (Signed)
11/29/2013 Cassidy [name]   Mar 10, 1965  [id number]  Primary Physician Vonna Drafts., FNP Primary Cardiologist: Dr. Ellyn Hack  HPI:  The patient is a 48 y/o female, followed by Dr. Ellyn Hack, with a h/o tobacco abuse, HTN, CAD s/p PCTA to [facility] and ostium of branch extending into the PLA vessel and PDA branch (05/2012), DM, HLD, PTSD/bipolar, fibromyalgia and GERD who presented to [facility] on 11/08/13 for planned coronary angiography for evaluation of progressively worsening exertional chest discomfort.  The procedure was performed by Dr Ellyn Hack. She was found to have severe single-vessel disease with a roughly 80% stenosis in the mid LAD, positive FFR of 0.73. She underwent successful PCI of the mid LAD utilizing a drug-eluting stent. She was also noted to have moderate disease in the circumflex at the trifurcation with widely patent PTCA site in the RCA. She was continued on dual antiplatelet therapy with aspirin plus Plavix as well as Coreg, lisinopril and statin.  She presents to clinic today for post hospital follow-up. Since discharge, she has been doing fairly well, however she does admit to one episode of chest discomfort that occurred several days ago. It was somewhat similar to her prior anginal symptoms, but at the same time, she had atypical features, in that it was slightly pruritic and sharp. The pain occurred at rest. It was somewhat improved with SL nitroglycerin. The pain ultimately resolved spontaneously after several hours. She denies any further recurrence since that time. She reports full medication compliance including full compliance with dual antiplatelet therapy. Unfortunately she continues to smoke, on average 1/2 a pack per day. She has made attempts to discontinue but she states that it is very hard given the increased stress her life, with family issues as well as issues with depression/bipolar disorder. She reports that she was on Wellbutrin in the past and that this helped  with her nicotine cravings, however she's been off Wellbutrin for some time as she has had difficulty getting this prescription filled, as this is not covered by her insurance.  As mentioned above, no recurrent chest pain since that one episode. She also denies dyspnea, dizziness, palpitations, syncope/near-syncope.   Current Outpatient Prescriptions  Medication Sig Dispense Refill  . acyclovir (ZOVIRAX) 400 MG tablet Take 1 tablet by mouth daily.  0  . aspirin EC 81 MG tablet Take 81 mg by mouth daily.    . canagliflozin (INVOKANA) 100 MG TABS tablet Take 100 mg by mouth daily.    . carvedilol (COREG) 6.25 MG tablet TAKE 1 TABLET BY MOUTH TWICE DAILY WITH A MEAL. 60 tablet 11  . clopidogrel (PLAVIX) 75 MG tablet Take 75 mg by mouth daily.    . Cyanocobalamin (B-12 PO) Take 400 mcg by mouth daily.     Marland Kitchen gabapentin (NEURONTIN) 300 MG capsule Take 600 mg by mouth 2 (two) times daily.     . insulin aspart (NOVOLOG) 100 UNIT/ML injection Inject 15 Units into the skin 3 (three) times daily before meals.    . insulin detemir (LEVEMIR) 100 UNIT/ML injection Inject 0.35 mLs (35 Units total) into the skin at bedtime. 20 mL 3  . Insulin Syringe-Needle U-100 30G X 5/16" 1 ML MISC Use as directed 4 times daily for insulin injections. 150 each prn  . lisinopril (PRINIVIL,ZESTRIL) 10 MG tablet Take 10 mg by mouth daily.    . metFORMIN (GLUCOPHAGE-XR) 500 MG 24 hr tablet Take 1 tablet (500 mg total) by mouth daily with breakfast. 30 tablet 11  . nitroGLYCERIN (  NITROSTAT) 0.4 MG SL tablet Place 1 tablet (0.4 mg total) under the tongue every 5 (five) minutes as needed for chest pain. 25 tablet 12  . omeprazole (PRILOSEC) 20 MG capsule Take 20 mg by mouth daily.    . Oxycodone HCl 10 MG TABS Take 10 mg by mouth every 6 (six) hours as needed (pain).    . pregabalin (LYRICA) 75 MG capsule Take 75 mg by mouth 2 (two) times daily.    . simvastatin (ZOCOR) 40 MG tablet Take 1 tablet (40 mg total) by mouth daily at 6  PM. 30 tablet 11  . traZODone (DESYREL) 100 MG tablet Take 50 mg by mouth at bedtime as needed for sleep.     Marland Kitchen triamcinolone cream (KENALOG) 0.1 % Apply 1 application topically daily.  1  . isosorbide mononitrate (IMDUR) 30 MG 24 hr tablet Take 0.5 tablets (15 mg total) by mouth daily. 30 tablet 6   No current facility-administered medications for this visit.    No Known Allergies  History   Social History  . Marital Status: Single    Spouse Name: N/A    Number of Children: 1  . Years of Education: N/A   Occupational History  . Not on file.   Social History Main Topics  . Smoking status: Current Every Day Smoker -- 0.25 packs/day for 30 years    Types: Cigarettes  . Smokeless tobacco: Never Used     Comment: 08/14/11 "quit for 3 wk 05/2011; was smoking 1 ppd since age 35 before I quit; at least I've cut down to 1/4 ppd"  . 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 Drug Use: No     Comment: 08/14/11 'quit everything in 2005"  . Sexual Activity:    Partners: Male    Patent examiner Protection: None   Other Topics Concern  . Not on file   Social History Narrative   Work: disability   Children: son, 58 yrs old   Regular exercise: some/ heart attack in May/ walks 1 mile 3 days a week   Caffeine use: daily, cup of coffee daily     Review of Systems: General: negative for chills, fever, night sweats or weight changes.  Cardiovascular: negative for chest pain, dyspnea on exertion, edema, orthopnea, palpitations, paroxysmal nocturnal dyspnea or shortness of breath Dermatological: negative for rash Respiratory: negative for cough or wheezing Urologic: negative for hematuria Abdominal: negative for nausea, vomiting, diarrhea, bright red blood per rectum, melena, or hematemesis Neurologic: negative for visual changes, syncope, or dizziness All other systems reviewed and are otherwise negative except as noted  above.    Blood pressure 132/80, pulse 90, height 5' 7.5" (1.715 m), weight 256 lb 4.8 oz (116.257 kg).  General appearance: alert, cooperative and no distress Neck: no carotid bruit and no JVD Lungs: clear to auscultation bilaterally Heart: regular rate and rhythm, S1, S2 normal, no murmur, click, rub or gallop Extremities: no LEE Pulses: 2+ and symmetric Skin: warm and dry Neurologic: Grossly normal  EKG normal sinus rhythm no ischemic changes.  ASSESSMENT AND PLAN:   1. Unstable angina: Patient had one episode of chest discomfort since undergoing PCI however this admixed typical and atypical features. She denies any recurrent symptoms since that time. We'll initiate low dose Imdur 15 mg daily. There is room with her blood pressure for this adjustment. Continue beta blocker.  2. CAD: Status post PCI + drug-eluting stent to  the mid LAD. Continue dual antiplatelet therapy with aspirin plus Plavix, as well as statin, beta blocker and ACE inhibitor. It should also be noted that the patient is on Prilosec for GERD. In the setting of concomitant Plavix therapy, I would prefer to switch her PPI to Protonix. However, the patient is on Medicaid and has difficulty getting medications. Protonix will be extremely expensive for her. She states that her GERD has been well-controlled on the Prilosec she wishes not to discontinue this. I feel that the best approach is to check a P2Y12 to make sure that she has satisfactory platelet inhibition. If not fully protected, we may need to discontinue her Prilosec and search for a different option, as this may have interference with Plavix therapy.  3. Hypertension: Controlled at 132/80. Continue current regimen. Will also add low dose Imdur to prevent recurrent angina.  4. Hyperlipidemia: Continue statin therapy with simvastatin.  5. Diabetes: Managed by PCP.  6. Tobacco abuse: Smoking cessation strongly advised.  PLAN  continue current medical regimen. Add  Imdur. Check a P2Y12. Follow-up with Dr. Ellyn Hack and 6-8 weeks for reassessment.  Rosalie Buenaventura, [place] 11/29/2013 6:22 PM

## 2013-11-29 NOTE — Patient Instructions (Signed)
Your physician recommends that you schedule a follow-up appointment in: Next Available with Dr Ellyn Hack   Your physician recommends that you return for lab work in: Today at [place] has recommended you make the following change in your medication: Start Isosorbide 15 mg daily

## 2013-11-30 IMAGING — CT CT ANGIO CHEST
2 of 6 series · 19 of 46 positions shown · IV contrast (omnipaque)
Comparison: 01/23/2009

CLINICAL DATA: Chest pain and a positive D-dimer

CT ANGIOGRAPHY CHEST
TECHNIQUE: Multidetector CT imaging of the chest using the
standard protocol during bolus administration of intravenous
contrast. Multiplanar reconstructed images including MIPs were
obtained and reviewed to evaluate the vascular anatomy.
Contrast: 100mL OMNIPAQUE IOHEXOL 350 MG/ML SOLN

[Series 6: pulm embolism 1.0 b25f thin · axial · 0.79mm/px · z∈[-264,-48]mm · 16 of 238 slices shown]
[im 11/238  lung]
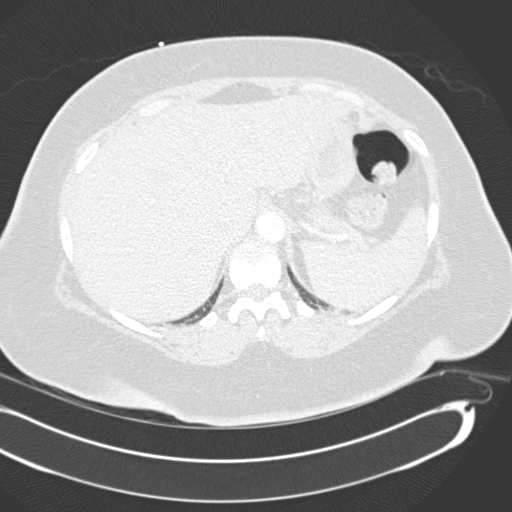
[im 31/238  soft-tissue]
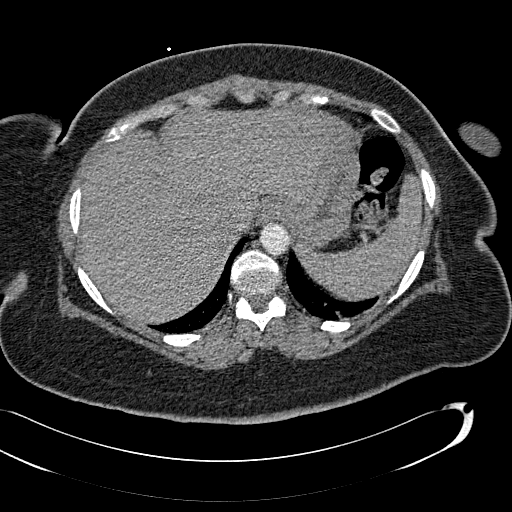
[im 42/238  lung]
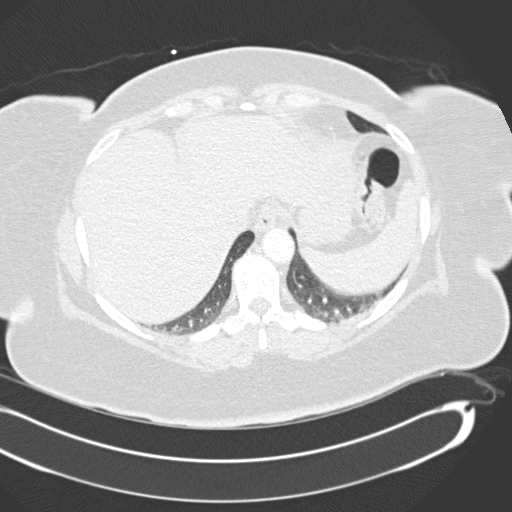
[im 52/238  soft-tissue]
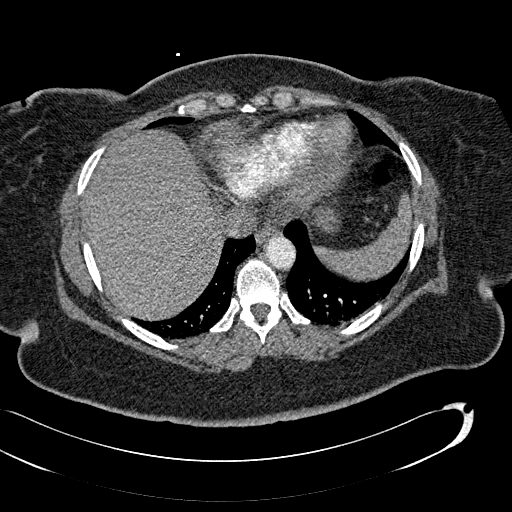
[im 73/238  lung]
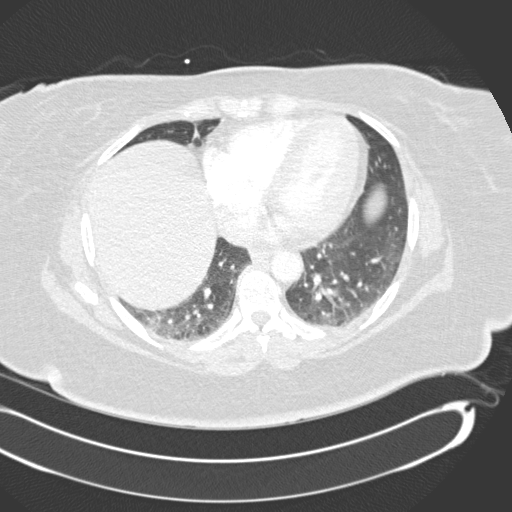
[im 83/238  soft-tissue]
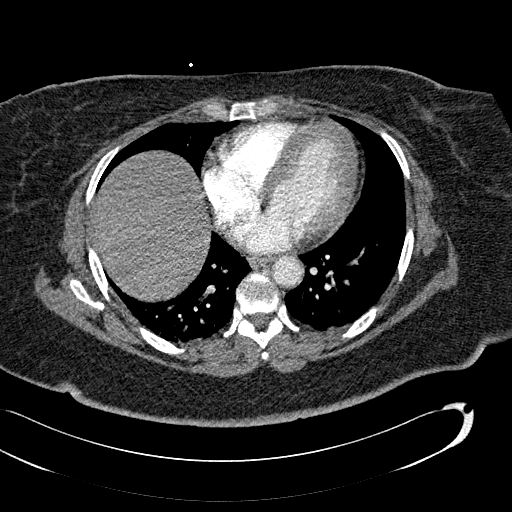
[im 93/238  lung]
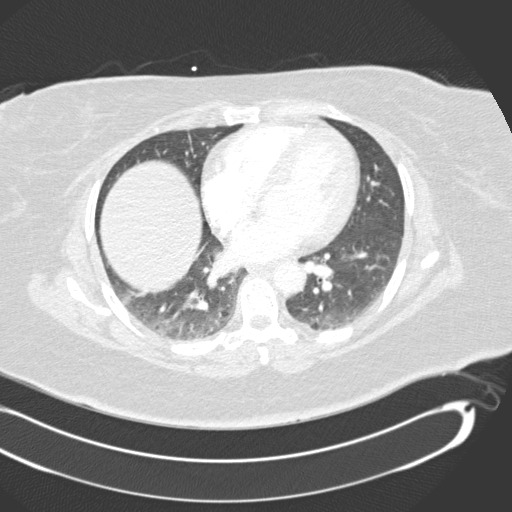
[im 114/238  soft-tissue]
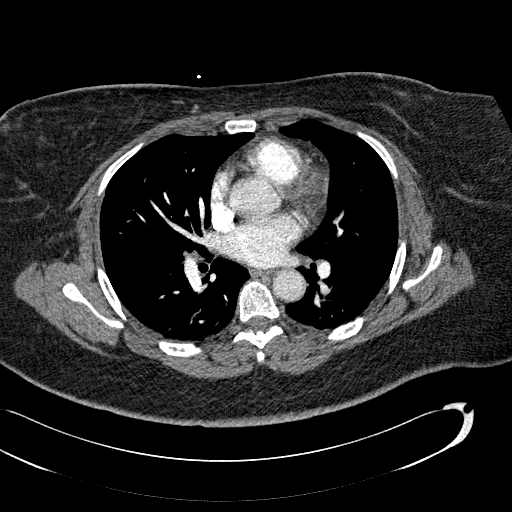
[im 124/238  lung]
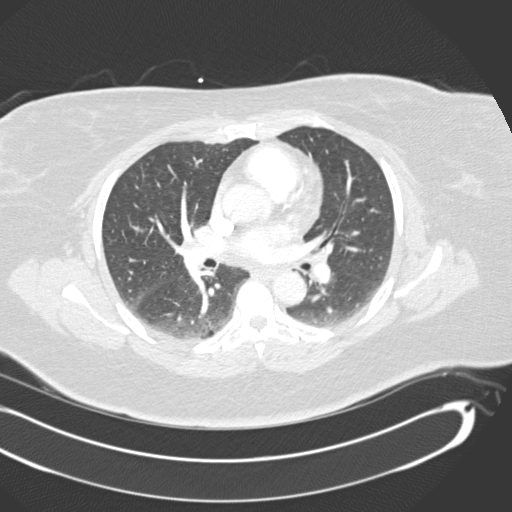
[im 145/238  soft-tissue]
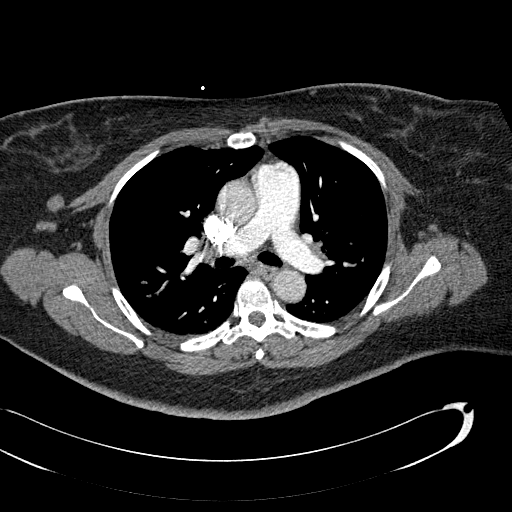
[im 155/238  lung]
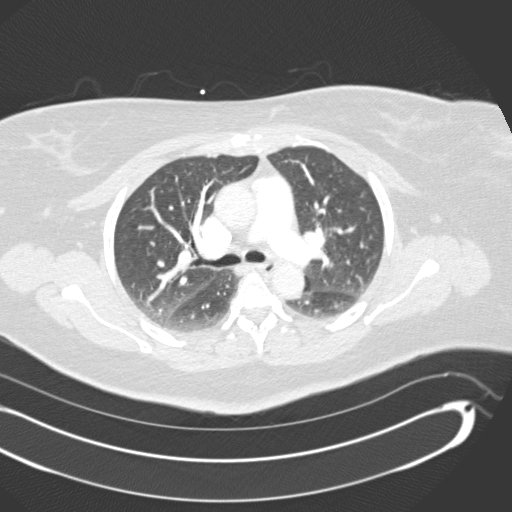
[im 165/238  soft-tissue]
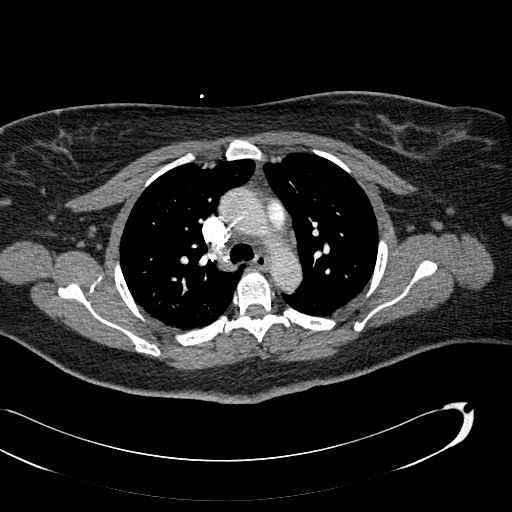
[im 186/238  lung]
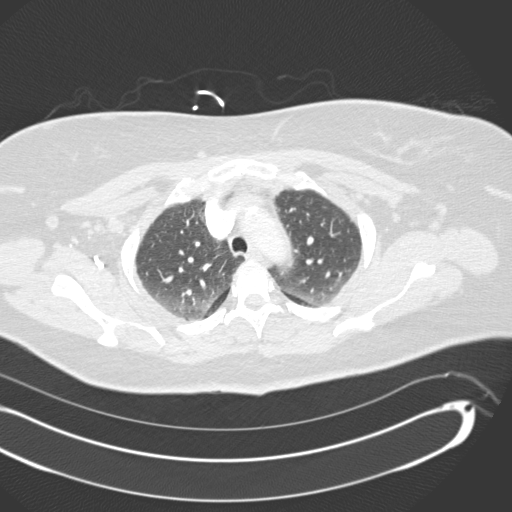
[im 196/238  soft-tissue]
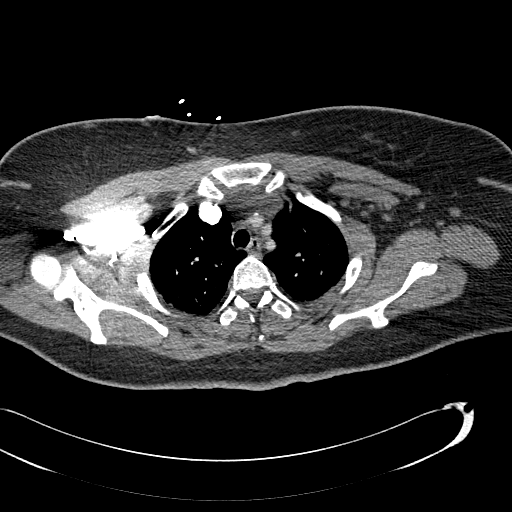
[im 207/238  lung]
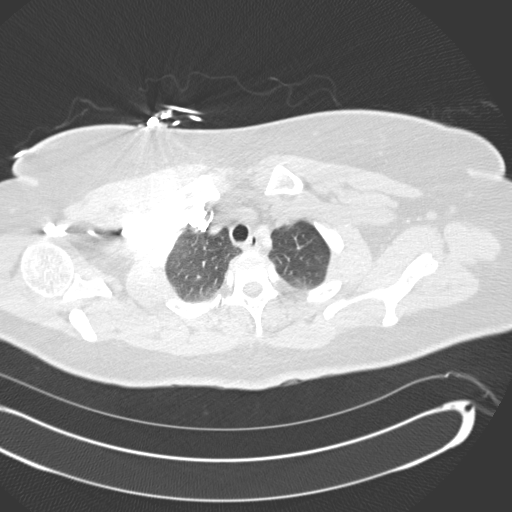
[im 227/238  soft-tissue]
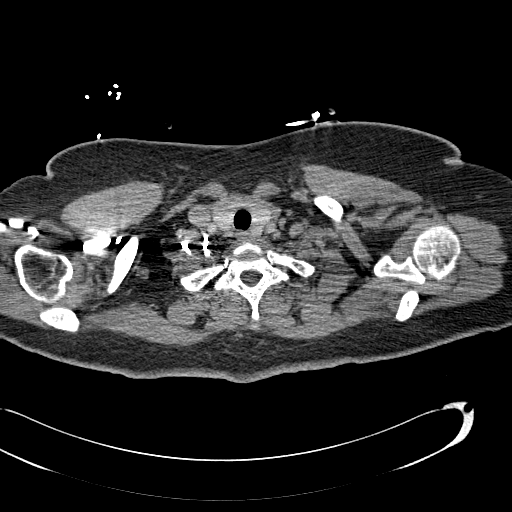

[Series 602: cor · coronal · 0.79mm/px · 3 of 108 slices shown]
[im 27/108  soft-tissue]
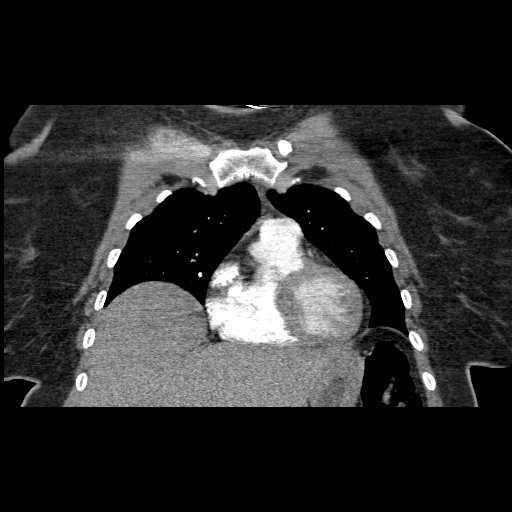
[im 54/108  soft-tissue]
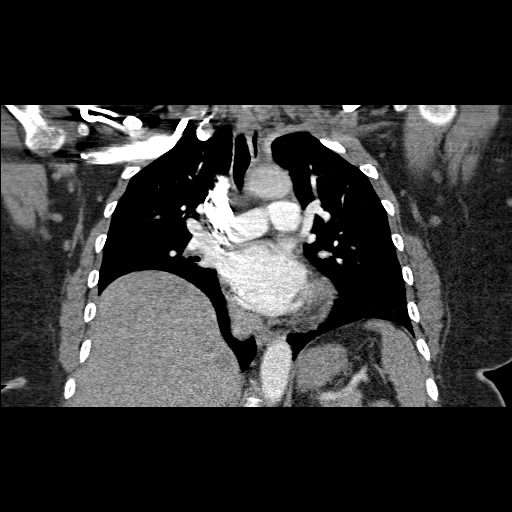
[im 81/108  soft-tissue]
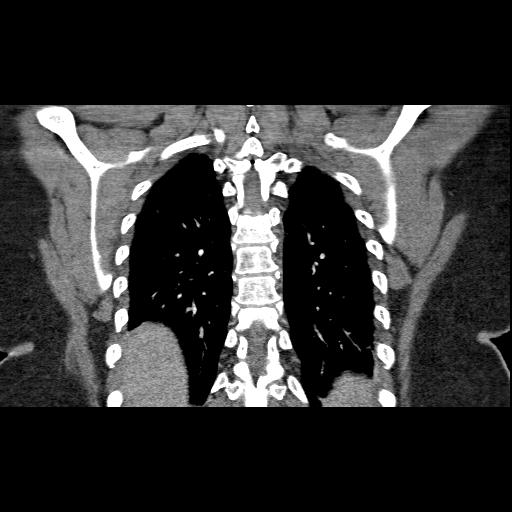

[19 of 46 positions shown; findings below may reference images not displayed]

FINDINGS: Lungs/pleura: No pleural effusion identified.  There is
no airspace consolidation identified.  Plate-like atelectasis is
identified in both lung bases.  4 mm subpleural nodules identified
in the left upper lobe, image 37/series 5.  This is stable from
previous exam and likely benign.  4 mm parenchymal nodule in the
left upper lobe is also stable from previous exam and likely
benign.  There is no airspace consolidation identified.

Heart/Mediastinum: Heart size is normal.  There is no pericardial
effusion identified.  There is no enlarged mediastinal or hilar
lymph nodes.  The main pulmonary artery is patent.  No lumbar or
segmental pulmonary artery filling defects identified to suggest
pulmonary embolus. Prominent coronary artery calcifications involve
the LAD.

Upper abdomen: There are no acute findings identified within the
upper abdomen.

Bones/Musculoskeletal:  There are prominent axillary lymph nodes
identified bilaterally.   Review of the visualized bony structures
is unremarkable.
IMPRESSION: 1.  No acute pulmonary embolus.
2.  Small pulmonary nodules are stable from previous exam and
likely benign.
3.  Prominent coronary artery calcifications involving the LAD.

## 2013-12-04 ENCOUNTER — Telehealth: Payer: Self-pay | Admitting: *Deleted

## 2013-12-04 NOTE — Telephone Encounter (Signed)
-----   Message from Consuelo Pandy, [place] sent at 11/30/2013  5:25 PM EST ----- Testing shows good platelet inhibition. Plavix is working and Prilosec does not seem to be interfering too much. Can continue meds.

## 2013-12-04 NOTE — Telephone Encounter (Signed)
Spoke to patient. Result given . Verbalized understanding  

## 2013-12-05 ENCOUNTER — Telehealth: Payer: Self-pay | Admitting: *Deleted

## 2013-12-05 NOTE — Telephone Encounter (Signed)
Faxed on 11/13/13  and signed cardiac rehab phase II

## 2013-12-05 NOTE — Telephone Encounter (Signed)
Faxed on 12/04/13 signed cardiac rehab phase II for qualified medicaid patients

## 2013-12-17 ENCOUNTER — Other Ambulatory Visit: Payer: Self-pay | Admitting: Cardiology

## 2013-12-17 NOTE — Telephone Encounter (Signed)
Rx was sent to pharmacy electronically. 

## 2013-12-20 ENCOUNTER — Encounter (HOSPITAL_COMMUNITY)
Admission: RE | Admit: 2013-12-20 | Discharge: 2013-12-20 | Disposition: A | Discharge status: Home / Self Care | Payer: Medicaid Other | Source: Ambulatory Visit | Attending: Cardiology | Admitting: Cardiology

## 2013-12-20 ENCOUNTER — Encounter (HOSPITAL_COMMUNITY): Payer: Self-pay | Admitting: Cardiovascular Disease

## 2013-12-20 ENCOUNTER — Ambulatory Visit (HOSPITAL_COMMUNITY)
Admission: RE | Admit: 2013-12-20 | Discharge: 2013-12-20 | Disposition: A | Discharge status: Home / Self Care | Payer: Medicaid Other | Source: Ambulatory Visit | Attending: Cardiovascular Disease | Admitting: Cardiovascular Disease

## 2013-12-20 ENCOUNTER — Telehealth: Payer: Self-pay | Admitting: Cardiology

## 2013-12-20 DIAGNOSIS — Z5189 Encounter for other specified aftercare: Secondary | ICD-10-CM | POA: Insufficient documentation

## 2013-12-20 DIAGNOSIS — M25531 Pain in right wrist: Secondary | ICD-10-CM | POA: Insufficient documentation

## 2013-12-20 DIAGNOSIS — E785 Hyperlipidemia, unspecified: Secondary | ICD-10-CM | POA: Insufficient documentation

## 2013-12-20 DIAGNOSIS — I10 Essential (primary) hypertension: Secondary | ICD-10-CM | POA: Insufficient documentation

## 2013-12-20 DIAGNOSIS — F3130 Bipolar disorder, current episode depressed, mild or moderate severity, unspecified: Secondary | ICD-10-CM | POA: Insufficient documentation

## 2013-12-20 DIAGNOSIS — I25110 Atherosclerotic heart disease of native coronary artery with unstable angina pectoris: Secondary | ICD-10-CM | POA: Insufficient documentation

## 2013-12-20 DIAGNOSIS — M797 Fibromyalgia: Secondary | ICD-10-CM | POA: Insufficient documentation

## 2013-12-20 DIAGNOSIS — Z794 Long term (current) use of insulin: Secondary | ICD-10-CM | POA: Insufficient documentation

## 2013-12-20 DIAGNOSIS — Z955 Presence of coronary angioplasty implant and graft: Secondary | ICD-10-CM | POA: Insufficient documentation

## 2013-12-20 DIAGNOSIS — E1142 Type 2 diabetes mellitus with diabetic polyneuropathy: Secondary | ICD-10-CM | POA: Insufficient documentation

## 2013-12-20 DIAGNOSIS — Z8249 Family history of ischemic heart disease and other diseases of the circulatory system: Secondary | ICD-10-CM | POA: Insufficient documentation

## 2013-12-20 DIAGNOSIS — F17210 Nicotine dependence, cigarettes, uncomplicated: Secondary | ICD-10-CM | POA: Insufficient documentation

## 2013-12-20 DIAGNOSIS — F4310 Post-traumatic stress disorder, unspecified: Secondary | ICD-10-CM | POA: Insufficient documentation

## 2013-12-20 DIAGNOSIS — K219 Gastro-esophageal reflux disease without esophagitis: Secondary | ICD-10-CM | POA: Insufficient documentation

## 2013-12-20 DIAGNOSIS — Z79899 Other long term (current) drug therapy: Secondary | ICD-10-CM | POA: Insufficient documentation

## 2013-12-20 NOTE — Telephone Encounter (Signed)
Pt called in stating that Dr. Ellyn Hack did a catheterization on 10/29 and she seem to recover well. But recently she has been having extreme pain in her wrist and would like to know what to do. Should she be seen? Please call  Thanks

## 2013-12-20 NOTE — Addendum Note (Signed)
Addended by: Cristopher Estimable on: 12/20/2013 11:28 AM   Modules accepted: Orders

## 2013-12-20 NOTE — Progress Notes (Signed)
Right Upper Ext. Venous Duplex Completed. No evidence of DVT, SVT or vascular abnormalities.  Oda Cogan, BS, RDMS, RVT

## 2013-12-20 NOTE — Telephone Encounter (Signed)
Left message for pt to call.

## 2013-12-20 NOTE — Progress Notes (Addendum)
Cardiac Rehab Medication Review by a Pharmacist  Does the patient  feel that his/her medications are working for him/her?  "Sometimes" pain from neuropathy, now in wrists AND legs, increased swelling on Lyrica.  Has the patient been experiencing any side effects to the medications prescribed?  yes Weight gain, constipation, swelling  Does the patient measure his/her own blood pressure or blood glucose at home?  yes , sugar, but does not have BP cuff.  Does the patient have any problems obtaining medications due to transportation or finances?   no  Understanding of regimen: excellent Understanding of indications: excellent Potential of compliance: excellent    Pharmacist comments:  Patient on duplicate medication therapy with both Lyrica and Gabapentin. She thinks Lyrica makes her swell. Advised patient to discuss with MD to stop one other these med and increase the dose of the other.   Katharyn Schauer S. Alford Highland, PharmD, [facility] Clinical Staff Pharmacist Pager [phone]  Eilene Ghazi [facility] 12/20/2013 8:44 AM

## 2013-12-20 NOTE — Telephone Encounter (Signed)
Discussed with dr Claiborne Billings, pt will come to the office today at 2 pm for doppler of the right wrist.

## 2013-12-20 NOTE — Telephone Encounter (Signed)
Spoke with pt, she is having pain in the right wrist where she had her cath in oct. The pain has been off and on since then and is getting worse. She is having trouble holding things and she reports the area is swollen. She states it is an arthritic feeling in the wrist and pain will shoot into her hand. Will discuss with dr kelly(DOD).

## 2013-12-24 ENCOUNTER — Encounter (HOSPITAL_COMMUNITY)
Admission: RE | Admit: 2013-12-24 | Discharge: 2013-12-24 | Disposition: A | Discharge status: Home / Self Care | Payer: Medicaid Other | Source: Ambulatory Visit | Attending: Cardiology | Admitting: Cardiology

## 2013-12-24 DIAGNOSIS — Z5189 Encounter for other specified aftercare: Secondary | ICD-10-CM | POA: Diagnosis not present

## 2013-12-24 DIAGNOSIS — F4310 Post-traumatic stress disorder, unspecified: Secondary | ICD-10-CM | POA: Diagnosis not present

## 2013-12-24 DIAGNOSIS — E785 Hyperlipidemia, unspecified: Secondary | ICD-10-CM | POA: Diagnosis not present

## 2013-12-24 DIAGNOSIS — I10 Essential (primary) hypertension: Secondary | ICD-10-CM | POA: Diagnosis not present

## 2013-12-24 DIAGNOSIS — F17210 Nicotine dependence, cigarettes, uncomplicated: Secondary | ICD-10-CM | POA: Diagnosis not present

## 2013-12-24 DIAGNOSIS — K219 Gastro-esophageal reflux disease without esophagitis: Secondary | ICD-10-CM | POA: Diagnosis not present

## 2013-12-24 DIAGNOSIS — Z955 Presence of coronary angioplasty implant and graft: Secondary | ICD-10-CM | POA: Diagnosis not present

## 2013-12-24 DIAGNOSIS — Z794 Long term (current) use of insulin: Secondary | ICD-10-CM | POA: Diagnosis not present

## 2013-12-24 DIAGNOSIS — M797 Fibromyalgia: Secondary | ICD-10-CM | POA: Diagnosis not present

## 2013-12-24 DIAGNOSIS — E1142 Type 2 diabetes mellitus with diabetic polyneuropathy: Secondary | ICD-10-CM | POA: Diagnosis not present

## 2013-12-24 DIAGNOSIS — F3130 Bipolar disorder, current episode depressed, mild or moderate severity, unspecified: Secondary | ICD-10-CM | POA: Diagnosis not present

## 2013-12-24 DIAGNOSIS — I25110 Atherosclerotic heart disease of native coronary artery with unstable angina pectoris: Secondary | ICD-10-CM | POA: Diagnosis not present

## 2013-12-24 DIAGNOSIS — Z79899 Other long term (current) drug therapy: Secondary | ICD-10-CM | POA: Diagnosis not present

## 2013-12-24 DIAGNOSIS — Z8249 Family history of ischemic heart disease and other diseases of the circulatory system: Secondary | ICD-10-CM | POA: Diagnosis not present

## 2013-12-24 LAB — GLUCOSE, CAPILLARY
GLUCOSE-CAPILLARY: 90 mg/dL (ref 70–99)
Glucose-Capillary: 150 mg/dL — ABNORMAL HIGH (ref 70–99)

## 2013-12-24 NOTE — Progress Notes (Addendum)
Pt in today for her first day of exercise in the phase II cardiac rehab program.  Pt tolerated light exercise with no complaints. Montior showed SR with no ectopy noted. Medication list reconciled.  Pt verbalizes compliance with medication.   PHQ2 score 5  .  Pt admits at times she struggles with depression at times.  Pt is a single parent of teen-age son who recently was suspended from school.  This is pt's second time of participation in cardiac rehab and feels she needs to focus on her needs instead of putting everyone ahead of herself.  Pt is working on tobacco cessation and has decreased the amount she smokes from 1 pack a day to 6 cigarettes.  Pt smokes 1/2 at a time as a way of decreasing her amount.  Pt desires to use nicotine patch to help but states "Medicaid doesn't pay" for it.  Pt has contacted Quit smoking support line and plans to attend tobacco cessation classes in January. Will follow up and periodically check in with pt for accountability.   Pt long term goal to strengthen heart and consistent with workouts.  Pt admits that after she graduated from cardiac rehab she exercised but didn't keep up with it. She is hoping this will get her back in a regular routine.  Long term goal is quit smoking and lose weight.  Will continue to check back in with pt to monitor progression toward meeting this goal.  Maurice Small RN, BSN

## 2013-12-26 ENCOUNTER — Encounter (HOSPITAL_COMMUNITY)
Admission: RE | Admit: 2013-12-26 | Discharge: 2013-12-26 | Disposition: A | Discharge status: Home / Self Care | Payer: Medicaid Other | Source: Ambulatory Visit | Attending: Cardiology | Admitting: Cardiology

## 2013-12-26 DIAGNOSIS — Z5189 Encounter for other specified aftercare: Secondary | ICD-10-CM | POA: Diagnosis not present

## 2013-12-26 LAB — GLUCOSE, CAPILLARY
Glucose-Capillary: 135 mg/dL — ABNORMAL HIGH (ref 70–99)
Glucose-Capillary: 59 mg/dL — ABNORMAL LOW (ref 70–99)
Glucose-Capillary: 93 mg/dL (ref 70–99)

## 2013-12-26 NOTE — Progress Notes (Signed)
Pt in today for cardiac rehab.  Pt with pre and post exercise glucose check.Marland Kitchen Pt pre 135.  Due to pt history of significant drop in blood glucose.  Pt given lemonade to sip.   Post exercise blood glucose 59.  Pt given lemonade punch with ginger ale.  Waited 15 minutes recheck 93.  Pt ate chicken stir fry with rice for breakfast this morning. AM fasting blood glucose on her machine 143.  Pt given peanut butter crackers to eat on her way home for lunch. Pt takes the bus and is unsure of how long it will take her to get home. Asked pt to eat a snack prior to exercise on Friday along with am breakfast. Verbalized understanding. Cherre Huger, BSN

## 2013-12-28 ENCOUNTER — Encounter (HOSPITAL_COMMUNITY): Payer: Medicaid Other

## 2013-12-28 ENCOUNTER — Telehealth (HOSPITAL_COMMUNITY): Payer: Self-pay | Admitting: Nurse Practitioner

## 2013-12-31 ENCOUNTER — Encounter (HOSPITAL_COMMUNITY)
Admission: RE | Admit: 2013-12-31 | Discharge: 2013-12-31 | Disposition: A | Discharge status: Home / Self Care | Payer: Medicaid Other | Source: Ambulatory Visit | Attending: Cardiology | Admitting: Cardiology

## 2013-12-31 DIAGNOSIS — Z5189 Encounter for other specified aftercare: Secondary | ICD-10-CM | POA: Diagnosis not present

## 2013-12-31 NOTE — Progress Notes (Signed)
Brief review of home exercise guidelines.  Pt was in program one year ago.  She plans to continue to walk at home for exercise. Alberteen Sam, MA, ACSM RCEP

## 2014-01-02 ENCOUNTER — Encounter (HOSPITAL_COMMUNITY): Payer: Medicaid Other

## 2014-01-07 ENCOUNTER — Encounter (HOSPITAL_COMMUNITY): Payer: Medicaid Other

## 2014-01-09 ENCOUNTER — Encounter (HOSPITAL_COMMUNITY): Payer: Medicaid Other

## 2014-01-10 ENCOUNTER — Other Ambulatory Visit: Payer: Self-pay | Admitting: Internal Medicine

## 2014-01-14 ENCOUNTER — Encounter (HOSPITAL_COMMUNITY)
Admission: RE | Admit: 2014-01-14 | Discharge: 2014-01-14 | Disposition: A | Discharge status: Home / Self Care | Payer: Medicaid Other | Source: Ambulatory Visit | Attending: Cardiology | Admitting: Cardiology

## 2014-01-14 DIAGNOSIS — E785 Hyperlipidemia, unspecified: Secondary | ICD-10-CM | POA: Diagnosis not present

## 2014-01-14 DIAGNOSIS — Z794 Long term (current) use of insulin: Secondary | ICD-10-CM | POA: Diagnosis not present

## 2014-01-14 DIAGNOSIS — I25110 Atherosclerotic heart disease of native coronary artery with unstable angina pectoris: Secondary | ICD-10-CM | POA: Diagnosis not present

## 2014-01-14 DIAGNOSIS — Z955 Presence of coronary angioplasty implant and graft: Secondary | ICD-10-CM | POA: Diagnosis not present

## 2014-01-14 DIAGNOSIS — F4310 Post-traumatic stress disorder, unspecified: Secondary | ICD-10-CM | POA: Diagnosis not present

## 2014-01-14 DIAGNOSIS — Z8249 Family history of ischemic heart disease and other diseases of the circulatory system: Secondary | ICD-10-CM | POA: Insufficient documentation

## 2014-01-14 DIAGNOSIS — F3130 Bipolar disorder, current episode depressed, mild or moderate severity, unspecified: Secondary | ICD-10-CM | POA: Insufficient documentation

## 2014-01-14 DIAGNOSIS — I10 Essential (primary) hypertension: Secondary | ICD-10-CM | POA: Diagnosis not present

## 2014-01-14 DIAGNOSIS — E1142 Type 2 diabetes mellitus with diabetic polyneuropathy: Secondary | ICD-10-CM | POA: Diagnosis not present

## 2014-01-14 DIAGNOSIS — M797 Fibromyalgia: Secondary | ICD-10-CM | POA: Insufficient documentation

## 2014-01-14 DIAGNOSIS — K219 Gastro-esophageal reflux disease without esophagitis: Secondary | ICD-10-CM | POA: Insufficient documentation

## 2014-01-14 DIAGNOSIS — Z79899 Other long term (current) drug therapy: Secondary | ICD-10-CM | POA: Insufficient documentation

## 2014-01-14 DIAGNOSIS — F17210 Nicotine dependence, cigarettes, uncomplicated: Secondary | ICD-10-CM | POA: Insufficient documentation

## 2014-01-14 DIAGNOSIS — Z5189 Encounter for other specified aftercare: Secondary | ICD-10-CM | POA: Insufficient documentation

## 2014-01-16 ENCOUNTER — Encounter (HOSPITAL_COMMUNITY)
Admission: RE | Admit: 2014-01-16 | Discharge: 2014-01-16 | Disposition: A | Discharge status: Home / Self Care | Payer: Medicaid Other | Source: Ambulatory Visit | Attending: Cardiology | Admitting: Cardiology

## 2014-01-16 DIAGNOSIS — Z5189 Encounter for other specified aftercare: Secondary | ICD-10-CM | POA: Diagnosis not present

## 2014-01-18 ENCOUNTER — Encounter (HOSPITAL_COMMUNITY)
Admission: RE | Admit: 2014-01-18 | Discharge: 2014-01-18 | Disposition: A | Discharge status: Home / Self Care | Payer: Medicaid Other | Source: Ambulatory Visit | Attending: Cardiology | Admitting: Cardiology

## 2014-01-18 DIAGNOSIS — Z5189 Encounter for other specified aftercare: Secondary | ICD-10-CM | POA: Diagnosis not present

## 2014-01-18 LAB — GLUCOSE, CAPILLARY
Glucose-Capillary: 150 mg/dL — ABNORMAL HIGH (ref 70–99)
Glucose-Capillary: 215 mg/dL — ABNORMAL HIGH (ref 70–99)

## 2014-01-21 ENCOUNTER — Encounter (HOSPITAL_COMMUNITY)
Admission: RE | Admit: 2014-01-21 | Discharge: 2014-01-21 | Disposition: A | Discharge status: Home / Self Care | Payer: Medicaid Other | Source: Ambulatory Visit | Attending: Cardiology | Admitting: Cardiology

## 2014-01-21 DIAGNOSIS — Z5189 Encounter for other specified aftercare: Secondary | ICD-10-CM | POA: Diagnosis not present

## 2014-01-21 LAB — GLUCOSE, CAPILLARY
GLUCOSE-CAPILLARY: 111 mg/dL — AB (ref 70–99)
Glucose-Capillary: 152 mg/dL — ABNORMAL HIGH (ref 70–99)

## 2014-01-23 ENCOUNTER — Encounter (HOSPITAL_COMMUNITY)
Admission: RE | Admit: 2014-01-23 | Discharge: 2014-01-23 | Disposition: A | Discharge status: Home / Self Care | Payer: Medicaid Other | Source: Ambulatory Visit | Attending: Cardiology | Admitting: Cardiology

## 2014-01-23 DIAGNOSIS — Z5189 Encounter for other specified aftercare: Secondary | ICD-10-CM | POA: Diagnosis not present

## 2014-01-25 ENCOUNTER — Encounter (HOSPITAL_COMMUNITY)
Admission: RE | Admit: 2014-01-25 | Discharge: 2014-01-25 | Disposition: A | Discharge status: Home / Self Care | Payer: Medicaid Other | Source: Ambulatory Visit | Attending: Cardiology | Admitting: Cardiology

## 2014-01-25 DIAGNOSIS — Z5189 Encounter for other specified aftercare: Secondary | ICD-10-CM | POA: Diagnosis not present

## 2014-01-25 LAB — GLUCOSE, CAPILLARY
Glucose-Capillary: 124 mg/dL — ABNORMAL HIGH (ref 70–99)
Glucose-Capillary: 120 mg/dL — ABNORMAL HIGH (ref 70–99)

## 2014-01-28 ENCOUNTER — Encounter (HOSPITAL_COMMUNITY): Payer: Medicaid Other

## 2014-01-30 ENCOUNTER — Encounter (HOSPITAL_COMMUNITY)
Admission: RE | Admit: 2014-01-30 | Discharge: 2014-01-30 | Disposition: A | Discharge status: Home / Self Care | Payer: Medicaid Other | Source: Ambulatory Visit | Attending: Cardiology | Admitting: Cardiology

## 2014-01-30 DIAGNOSIS — Z5189 Encounter for other specified aftercare: Secondary | ICD-10-CM | POA: Diagnosis not present

## 2014-01-30 LAB — GLUCOSE, CAPILLARY
Glucose-Capillary: 110 mg/dL — ABNORMAL HIGH (ref 70–99)
Glucose-Capillary: 114 mg/dL — ABNORMAL HIGH (ref 70–99)

## 2014-02-01 ENCOUNTER — Ambulatory Visit: Payer: Medicaid Other | Admitting: Cardiology

## 2014-02-01 ENCOUNTER — Encounter (HOSPITAL_COMMUNITY): Payer: Medicaid Other

## 2014-02-04 ENCOUNTER — Encounter (HOSPITAL_COMMUNITY): Payer: Medicaid Other

## 2014-02-06 ENCOUNTER — Encounter (HOSPITAL_COMMUNITY): Payer: Self-pay | Admitting: Emergency Medicine

## 2014-02-06 ENCOUNTER — Emergency Department (HOSPITAL_COMMUNITY)
Admission: EM | Admit: 2014-02-06 | Discharge: 2014-02-06 | Disposition: A | Discharge status: Home / Self Care | Payer: Medicaid Other | Attending: Emergency Medicine | Admitting: Emergency Medicine

## 2014-02-06 ENCOUNTER — Encounter (HOSPITAL_COMMUNITY): Payer: Medicaid Other

## 2014-02-06 DIAGNOSIS — Z794 Long term (current) use of insulin: Secondary | ICD-10-CM | POA: Diagnosis not present

## 2014-02-06 DIAGNOSIS — Z7952 Long term (current) use of systemic steroids: Secondary | ICD-10-CM | POA: Diagnosis not present

## 2014-02-06 DIAGNOSIS — K219 Gastro-esophageal reflux disease without esophagitis: Secondary | ICD-10-CM | POA: Diagnosis not present

## 2014-02-06 DIAGNOSIS — Z9861 Coronary angioplasty status: Secondary | ICD-10-CM | POA: Diagnosis not present

## 2014-02-06 DIAGNOSIS — Z8659 Personal history of other mental and behavioral disorders: Secondary | ICD-10-CM | POA: Insufficient documentation

## 2014-02-06 DIAGNOSIS — I10 Essential (primary) hypertension: Secondary | ICD-10-CM | POA: Diagnosis not present

## 2014-02-06 DIAGNOSIS — Z8619 Personal history of other infectious and parasitic diseases: Secondary | ICD-10-CM | POA: Insufficient documentation

## 2014-02-06 DIAGNOSIS — Z8739 Personal history of other diseases of the musculoskeletal system and connective tissue: Secondary | ICD-10-CM | POA: Diagnosis not present

## 2014-02-06 DIAGNOSIS — Z720 Tobacco use: Secondary | ICD-10-CM | POA: Diagnosis not present

## 2014-02-06 DIAGNOSIS — E1140 Type 2 diabetes mellitus with diabetic neuropathy, unspecified: Secondary | ICD-10-CM | POA: Diagnosis not present

## 2014-02-06 DIAGNOSIS — Z7982 Long term (current) use of aspirin: Secondary | ICD-10-CM | POA: Insufficient documentation

## 2014-02-06 DIAGNOSIS — I2510 Atherosclerotic heart disease of native coronary artery without angina pectoris: Secondary | ICD-10-CM | POA: Diagnosis not present

## 2014-02-06 DIAGNOSIS — N61 Inflammatory disorders of breast: Secondary | ICD-10-CM | POA: Diagnosis present

## 2014-02-06 DIAGNOSIS — D649 Anemia, unspecified: Secondary | ICD-10-CM | POA: Diagnosis not present

## 2014-02-06 DIAGNOSIS — Z9889 Other specified postprocedural states: Secondary | ICD-10-CM | POA: Insufficient documentation

## 2014-02-06 DIAGNOSIS — E785 Hyperlipidemia, unspecified: Secondary | ICD-10-CM | POA: Insufficient documentation

## 2014-02-06 DIAGNOSIS — L0291 Cutaneous abscess, unspecified: Secondary | ICD-10-CM

## 2014-02-06 LAB — CBC WITH DIFFERENTIAL/PLATELET
Basophils Absolute: 0 10*3/uL (ref 0.0–0.1)
Basophils Relative: 0 % (ref 0–1)
Eosinophils Absolute: 0.1 10*3/uL (ref 0.0–0.7)
Eosinophils Relative: 1 % (ref 0–5)
HEMATOCRIT: 39 % (ref 36.0–46.0)
HEMOGLOBIN: 13.4 g/dL (ref 12.0–15.0)
LYMPHS ABS: 2.6 10*3/uL (ref 0.7–4.0)
Lymphocytes Relative: 26 % (ref 12–46)
MCH: 28.5 pg (ref 26.0–34.0)
MCHC: 34.4 g/dL (ref 30.0–36.0)
MCV: 83 fL (ref 78.0–100.0)
MONOS PCT: 4 % (ref 3–12)
Monocytes Absolute: 0.4 10*3/uL (ref 0.1–1.0)
NEUTROS ABS: 6.8 10*3/uL (ref 1.7–7.7)
Neutrophils Relative %: 69 % (ref 43–77)
Platelets: 290 10*3/uL (ref 150–400)
RBC: 4.7 MIL/uL (ref 3.87–5.11)
RDW: 13.9 % (ref 11.5–15.5)
WBC: 9.9 10*3/uL (ref 4.0–10.5)

## 2014-02-06 LAB — COMPREHENSIVE METABOLIC PANEL
ALBUMIN: 4 g/dL (ref 3.5–5.2)
ALT: 11 U/L (ref 0–35)
AST: 17 U/L (ref 0–37)
Alkaline Phosphatase: 80 U/L (ref 39–117)
Anion gap: 6 (ref 5–15)
BILIRUBIN TOTAL: 0.6 mg/dL (ref 0.3–1.2)
BUN: 14 mg/dL (ref 6–23)
CO2: 25 mmol/L (ref 19–32)
CREATININE: 0.91 mg/dL (ref 0.50–1.10)
Calcium: 8.9 mg/dL (ref 8.4–10.5)
Chloride: 108 mmol/L (ref 96–112)
GFR calc Af Amer: 84 mL/min — ABNORMAL LOW (ref >90)
GFR calc non Af Amer: 72 mL/min — ABNORMAL LOW (ref >90)
Glucose, Bld: 211 mg/dL — ABNORMAL HIGH (ref 70–99)
Potassium: 4 mmol/L (ref 3.5–5.1)
Sodium: 139 mmol/L (ref 135–145)
Total Protein: 7.5 g/dL (ref 6.0–8.3)

## 2014-02-06 MED ORDER — LIDOCAINE-EPINEPHRINE (PF) 2 %-1:200000 IJ SOLN
10.0000 mL | Freq: Once | INTRAMUSCULAR | Status: DC
  Filled 2014-02-06: qty 20

## 2014-02-06 MED ORDER — SODIUM CHLORIDE 0.9 % IV BOLUS (SEPSIS)
1000.0000 mL | Freq: Once | INTRAVENOUS | Status: AC
  Administered 2014-02-06: 1000 mL via INTRAVENOUS

## 2014-02-06 MED ORDER — HYDROMORPHONE HCL 1 MG/ML IJ SOLN
1.0000 mg | Freq: Once | INTRAMUSCULAR | Status: AC
  Administered 2014-02-06: 1 mg via INTRAVENOUS
  Filled 2014-02-06: qty 1

## 2014-02-06 MED ORDER — OXYCODONE HCL 5 MG PO TABS: 5.0000 mg | ORAL_TABLET | Freq: Four times a day (QID) | ORAL | Status: DC | PRN

## 2014-02-06 MED ORDER — ONDANSETRON HCL 4 MG/2ML IJ SOLN
4.0000 mg | Freq: Once | INTRAMUSCULAR | Status: AC
  Administered 2014-02-06: 4 mg via INTRAVENOUS
  Filled 2014-02-06: qty 2

## 2014-02-06 MED ORDER — DIAZEPAM 5 MG/ML IJ SOLN
2.5000 mg | Freq: Once | INTRAMUSCULAR | Status: AC
  Administered 2014-02-06: 2.5 mg via INTRAVENOUS
  Filled 2014-02-06: qty 2

## 2014-02-06 NOTE — ED Provider Notes (Signed)
CSN: [id number]     Arrival date & time 02/06/14  1555 History   First MD Initiated Contact with Patient 02/06/14 1629     Chief Complaint  Patient presents with  . Abscess     (Consider location/radiation/quality/duration/timing/severity/associated sxs/prior Treatment) HPI Comments: Patient with past medical history of breast abscess, hyperlipidemia, hypertension, and diabetes presents to the emergency department with chief complaint of right nipple pain. She states that her symptoms started last night. She states that she has had multiple abscesses to the nipple and underlying breast tissue. She states that she has had abscesses as big as an egg. These have always had to be drained by surgery. Her last abscess was in 2014, and was drained by Dr. Georgette Dover.  She states that the pain is 10/10.  She tried taking oxycodone with minimal relief.  The symptoms are aggravated with palpation and movement.  She denies any fevers, chills, nausea, or vomiting.  The history is provided by the patient. No language interpreter was used.    Past Medical History  Diagnosis Date  . Hypertension   . GERD (gastroesophageal reflux disease)   . Breast abscess     a. right side.   Marland Kitchen HLD (hyperlipidemia)   . Type II diabetes mellitus   . Anemia   . Sickle cell trait   . Fibromyalgia   . Genital warts   . Diabetic peripheral neuropathy   . Depression   . Bipolar depression   . PTSD (post-traumatic stress disorder)   . Tooth caries   . Pain in limb     a. LE VENOUS DUPLEX, 02/05/2009 - no evidence of deep vein thrombosis, Baker's cyst  . CAD (coronary artery disease)     a.  s/p PCTA to dRCA and ostium of branch extending into the PLA vessel and PDA branch (05/2012)  b. [place] s/p DES to mLAD  . Tobacco abuse    Past Surgical History  Procedure Laterality Date  . Bladder surgery  1980    "TVT"  . Irrigation and debridement abscess  12/19/2010    Procedure: IRRIGATION AND DEBRIDEMENT ABSCESS;  Surgeon:  Rolm Bookbinder, MD;  Location: [place];  Service: General;  Laterality: Right;  . Incise and drain abcess  ; 04/26/11; 08/13/11    right breast  . Tonsillectomy  1988  . Refractive surgery  ~ 2010    right  . Irrigation and debridement abscess  08/13/2011    Procedure: MINOR INCISION AND DRAINAGE OF ABSCESS;  Surgeon: Gwenyth Ober, MD;  Location: [facility];  Service: General;  Laterality: Right;  Right Breast   . Irrigation and debridement abscess  11/17/2011    Procedure: IRRIGATION AND DEBRIDEMENT ABSCESS;  Surgeon: Imogene Burn. Georgette Dover, MD;  Location: [facility];  Service: General;  Laterality: Right;  irrigation and debridement right recurrent breast abscess  . Breast surgery Right     I&D for multiple abscesses  . Eye surgery Left     laser surgery  . Larynx surgery    . Ployp removed from voice box 03/30/12    . Incision and drainage abscess Right 05/09/2012    Procedure: INCISION AND DRAINAGE RIGHT BREAST ABSCESS;  Surgeon: Imogene Burn. Georgette Dover, MD;  Location: [place];  Service: General;  Laterality: Right;  . Nm myoview ltd  01/26/2009    Normal study, no evidence of ischemia, EF 67%  . Cutting balloon    . Cardiac catheterization  05/2012    see medical Hx.  Marland Kitchen  Coronary stent placement  11/08/2013    LAD   DES        dr harding   . Left heart catheterization with coronary angiogram N/A 05/29/2012    Procedure: LEFT HEART CATHETERIZATION WITH CORONARY ANGIOGRAM;  Surgeon: Troy Sine, MD;  Location: [facility] CATH LAB;  Service: Cardiovascular;  Laterality: N/A;  . Left heart catheterization with coronary angiogram N/A 11/08/2013    Procedure: LEFT HEART CATHETERIZATION WITH CORONARY ANGIOGRAM;  Surgeon: Leonie Man, MD;  Location: [facility] CATH LAB;  Service: Cardiovascular;  Laterality: N/A;   Family History  Problem Relation Age of Onset  . COPD Mother   . Cancer Mother     lymphoma  . Heart disease Mother   . Cancer Maternal Aunt     breast, colon  . Hyperlipidemia Father   . Heart disease Father    . Hypertension Brother   . Hypertension Maternal Grandmother   . Diabetes Maternal Grandmother    History  Substance Use Topics  . Smoking status: Current Every Day Smoker -- 0.25 packs/day for 30 years    Types: Cigarettes  . Smokeless tobacco: Never Used     Comment: 08/14/11 "quit for 3 wk 05/2011; was smoking 1 ppd since age 29 before I quit; at least I've cut down to 1/4 ppd"  . 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  Respiratory: Negative for shortness of breath.   Cardiovascular: Negative for chest pain.  Gastrointestinal: Negative for nausea, vomiting, diarrhea and constipation.  Genitourinary: Negative for dysuria.  Skin: Positive for wound.  All other systems reviewed and are negative.     Allergies  Review of patient's allergies indicates no known allergies.  Home Medications   Prior to Admission medications   Medication Sig Start Date End Date Taking? Authorizing Provider  acyclovir (ZOVIRAX) 400 MG tablet Take 1 tablet by mouth daily. 11/09/13   Historical Provider, MD  aspirin EC 81 MG tablet Take 81 mg by mouth daily.    Historical Provider, MD  canagliflozin (INVOKANA) 100 MG TABS tablet Take 100 mg by mouth daily.    Historical Provider, MD  carvedilol (COREG) 6.25 MG tablet TAKE 1 TABLET BY MOUTH TWICE DAILY WITH A MEAL. 11/07/13   Leonie Man, MD  clopidogrel (PLAVIX) 75 MG tablet TAKE 1 TABLET BY MOUTH EVERY DAY. 12/17/13   Leonie Man, MD  Cyanocobalamin (B-12 PO) Take 400 mcg by mouth daily.     Historical Provider, MD  fluconazole (DIFLUCAN) 150 MG tablet Take 150 mg by mouth as needed (for yeast infections caused by Invokana).    Historical Provider, MD  gabapentin (NEURONTIN) 300 MG capsule Take 600 mg by mouth 2 (two)  times daily.     Historical Provider, MD  insulin aspart (NOVOLOG) 100 UNIT/ML injection Inject 15 Units into the skin 3 (three) times daily before meals.    Historical Provider, MD  Insulin Syringe-Needle U-100 (INSULIN SYRINGE 1CC/31GX5/16") 31G X 5/16" 1 ML MISC USE AS DIRECTED FOUR TIMES DAILY FOR INSULIN INJECTIONS 01/10/14   Philemon Kingdom, MD  Insulin Syringe-Needle U-100 30G X 5/16" 1 ML MISC Use as directed 4 times daily for insulin injections. 11/28/12  Philemon Kingdom, MD  isosorbide mononitrate (IMDUR) 30 MG 24 hr tablet Take 0.5 tablets (15 mg total) by mouth daily. 11/29/13   Brittainy Erie Noe, PA-C  LEVEMIR 100 UNIT/ML injection INJECT 0.35 MLS INTO THE SKIN AT BEDTIME 01/13/14   Philemon Kingdom, MD  LEVEMIR 100 UNIT/ML injection INJECT 0.35 MLS INTO THE SKIN AT BEDTIME 01/13/14   Philemon Kingdom, MD  lisinopril (PRINIVIL,ZESTRIL) 10 MG tablet Take 10 mg by mouth daily.    Historical Provider, MD  metFORMIN (GLUCOPHAGE-XR) 500 MG 24 hr tablet Take 1 tablet (500 mg total) by mouth daily with breakfast. 11/11/13   Eileen Stanford, PA-C  nitroGLYCERIN (NITROSTAT) 0.4 MG SL tablet Place 1 tablet (0.4 mg total) under the tongue every 5 (five) minutes as needed for chest pain. 11/09/13   Eileen Stanford, PA-C  omeprazole (PRILOSEC) 20 MG capsule Take 20 mg by mouth daily.    Historical Provider, MD  oxycodone (OXY-IR) 5 MG capsule Take 5 mg by mouth 3 (three) times daily as needed for pain (from pain management clinic).    Historical Provider, MD  Oxycodone HCl 10 MG TABS Take 10 mg by mouth every 6 (six) hours as needed (pain).    Historical Provider, MD  pregabalin (LYRICA) 75 MG capsule Take 75 mg by mouth 2 (two) times daily.    Historical Provider, MD  simvastatin (ZOCOR) 40 MG tablet Take 1 tablet (40 mg total) by mouth daily at 6 PM. 11/09/13   Eileen Stanford, PA-C  traZODone (DESYREL) 100 MG tablet Take 50 mg by mouth at bedtime as needed for sleep.     Historical  Provider, MD  triamcinolone cream (KENALOG) 0.1 % Apply 1 application topically daily. 08/22/13   Historical Provider, MD   BP 126/65 mmHg  Pulse 95  Temp(Src) 97.8 F (36.6 C) (Oral)  Resp 18  Ht 5\' 8"  (1.727 m)  Wt 240 lb (108.863 kg)  BMI 36.50 kg/m2  SpO2 98% Physical Exam  Constitutional: She is oriented to person, place, and time. She appears well-developed and well-nourished.  HENT:  Head: Normocephalic and atraumatic.  Eyes: Conjunctivae and EOM are normal. Pupils are equal, round, and reactive to light.  Neck: Normal range of motion. Neck supple.  Cardiovascular: Normal rate and regular rhythm.  Exam reveals no gallop and no friction rub.   No murmur heard. Pulmonary/Chest: Effort normal and breath sounds normal. No respiratory distress. She has no wheezes. She has no rales. She exhibits no tenderness.  Abdominal: Soft. Bowel sounds are normal. She exhibits no distension and no mass. There is no tenderness. There is no rebound and no guarding.  Musculoskeletal: Normal range of motion. She exhibits no edema or tenderness.  Neurological: She is alert and oriented to person, place, and time.  Skin: Skin is warm and dry.  Chaperone present for breast exam, right nipple is very indurated and exquisitely painful to palpation, there is some foul-smelling discharge, no surrounding erythema  Psychiatric: She has a normal mood and affect. Her behavior is normal. Judgment and thought content normal.  Nursing note and vitals reviewed.   ED Course  Procedures (including critical care time) Results for orders placed or performed during the hospital encounter of 02/06/14  CBC with Differential  Result Value Ref Range   WBC 9.9 4.0 - 10.5 K/uL   RBC 4.70 3.87 - 5.11 MIL/uL   Hemoglobin 13.4 12.0 - 15.0 g/dL   HCT 39.0 36.0 - 46.0 %   MCV 83.0 78.0 -  100.0 fL   MCH 28.5 26.0 - 34.0 pg   MCHC 34.4 30.0 - 36.0 g/dL   RDW 13.9 11.5 - 15.5 %   Platelets 290 150 - 400 K/uL   Neutrophils  Relative % 69 43 - 77 %   Neutro Abs 6.8 1.7 - 7.7 K/uL   Lymphocytes Relative 26 12 - 46 %   Lymphs Abs 2.6 0.7 - 4.0 K/uL   Monocytes Relative 4 3 - 12 %   Monocytes Absolute 0.4 0.1 - 1.0 K/uL   Eosinophils Relative 1 0 - 5 %   Eosinophils Absolute 0.1 0.0 - 0.7 K/uL   Basophils Relative 0 0 - 1 %   Basophils Absolute 0.0 0.0 - 0.1 K/uL  Comprehensive metabolic panel  Result Value Ref Range   Sodium 139 135 - 145 mmol/L   Potassium 4.0 3.5 - 5.1 mmol/L   Chloride 108 96 - 112 mmol/L   CO2 25 19 - 32 mmol/L   Glucose, Bld 211 (H) 70 - 99 mg/dL   BUN 14 6 - 23 mg/dL   Creatinine, Ser 0.91 0.50 - 1.10 mg/dL   Calcium 8.9 8.4 - 10.5 mg/dL   Total Protein 7.5 6.0 - 8.3 g/dL   Albumin 4.0 3.5 - 5.2 g/dL   AST 17 0 - 37 U/L   ALT 11 0 - 35 U/L   Alkaline Phosphatase 80 39 - 117 U/L   Total Bilirubin 0.6 0.3 - 1.2 mg/dL   GFR calc non Af Amer 72 (L) >90 mL/min   GFR calc Af Amer 84 (L) >90 mL/min   Anion gap 6 5 - 15   No results found.   Imaging Review No results found.   EKG Interpretation None      MDM   Final diagnoses:  Abscess    Patient with diabetes and history of breast abscesses. There is a new abscess on right nipple or beneath it.  Patient seen by and discussed with Dr. Aline Brochure, who recommends consulting general surgery.  6:04 PM Patient discussed with Dr. Hulen Skains, who will consult.  6:39 PM Patient seen by and discussed with Dr. Hulen Skains, who recommends circumareolar incision and drainage in the ED.  Discussed with Dr. Aline Brochure, who agrees with plan.  If I am unable to relieve patient's pain, will call back Dr. Hulen Skains.  Otherwise, she can be seen in the surgery clinic on Friday.  Patient states that she would rather be seen by Dr. Georgette Dover on an outpatient basis.  I told her that either me or Dr. Hulen Skains would be happy to drain the abscess here.  However, she declines I&D now.  She will call [facility] to be seen by Dr. Georgette Dover.  She states that she came to the ED  because she needed a referral, and was unable to be seen by her PCP today.  She is also out of her pain medications.  She states that she called her pain management doctor, who will see her tomorrow.  Patient states that pain management doctor will allow for me to write the patient for a few tablets for tonight and tomorrow morning.    Discussed the plan with Dr. Aline Brochure, who agrees with the plan.  BP noted, manual BP is 109/87.  Automatic cuff does not fit well.   Patient is well appearing.       Montine Circle, PA-C 02/06/14 [place], MD 02/07/14 1139

## 2014-02-06 NOTE — ED Notes (Signed)
Pt presents with abscess to right nipple that started last night- hx of the same.  In 2014 pt had I&D done by Dr. Georgette Dover to same area.  Denies fevers or chills.

## 2014-02-06 NOTE — Discharge Instructions (Signed)

## 2014-02-08 ENCOUNTER — Telehealth (HOSPITAL_COMMUNITY): Payer: Self-pay | Admitting: Nurse Practitioner

## 2014-02-08 ENCOUNTER — Encounter (HOSPITAL_COMMUNITY): Payer: Medicaid Other

## 2014-02-08 ENCOUNTER — Encounter (HOSPITAL_COMMUNITY)
Admission: AD | Disposition: A | Discharge status: Home Health | Payer: Self-pay | Source: Ambulatory Visit | Attending: Surgery

## 2014-02-08 ENCOUNTER — Observation Stay (HOSPITAL_COMMUNITY): Payer: Medicaid Other | Admitting: Anesthesiology

## 2014-02-08 ENCOUNTER — Other Ambulatory Visit (INDEPENDENT_AMBULATORY_CARE_PROVIDER_SITE_OTHER): Payer: Self-pay | Admitting: Surgery

## 2014-02-08 ENCOUNTER — Inpatient Hospital Stay (HOSPITAL_COMMUNITY)
Admission: AD | Admit: 2014-02-08 | Discharge: 2014-02-10 | DRG: 585 | Disposition: A | Discharge status: Home Health | Payer: Medicaid Other | Source: Ambulatory Visit | Attending: Surgery | Admitting: Surgery

## 2014-02-08 DIAGNOSIS — D649 Anemia, unspecified: Secondary | ICD-10-CM | POA: Diagnosis present

## 2014-02-08 DIAGNOSIS — F319 Bipolar disorder, unspecified: Secondary | ICD-10-CM | POA: Diagnosis present

## 2014-02-08 DIAGNOSIS — Z955 Presence of coronary angioplasty implant and graft: Secondary | ICD-10-CM

## 2014-02-08 DIAGNOSIS — M797 Fibromyalgia: Secondary | ICD-10-CM | POA: Diagnosis present

## 2014-02-08 DIAGNOSIS — N61 Inflammatory disorders of breast: Principal | ICD-10-CM | POA: Diagnosis present

## 2014-02-08 DIAGNOSIS — K029 Dental caries, unspecified: Secondary | ICD-10-CM | POA: Diagnosis present

## 2014-02-08 DIAGNOSIS — D573 Sickle-cell trait: Secondary | ICD-10-CM | POA: Diagnosis present

## 2014-02-08 DIAGNOSIS — Z8249 Family history of ischemic heart disease and other diseases of the circulatory system: Secondary | ICD-10-CM

## 2014-02-08 DIAGNOSIS — F17210 Nicotine dependence, cigarettes, uncomplicated: Secondary | ICD-10-CM | POA: Diagnosis present

## 2014-02-08 DIAGNOSIS — Z7982 Long term (current) use of aspirin: Secondary | ICD-10-CM

## 2014-02-08 DIAGNOSIS — Z79899 Other long term (current) drug therapy: Secondary | ICD-10-CM

## 2014-02-08 DIAGNOSIS — Z7902 Long term (current) use of antithrombotics/antiplatelets: Secondary | ICD-10-CM

## 2014-02-08 DIAGNOSIS — N611 Abscess of the breast and nipple: Secondary | ICD-10-CM | POA: Diagnosis present

## 2014-02-08 DIAGNOSIS — E1142 Type 2 diabetes mellitus with diabetic polyneuropathy: Secondary | ICD-10-CM | POA: Diagnosis present

## 2014-02-08 DIAGNOSIS — Z794 Long term (current) use of insulin: Secondary | ICD-10-CM

## 2014-02-08 DIAGNOSIS — I10 Essential (primary) hypertension: Secondary | ICD-10-CM | POA: Diagnosis present

## 2014-02-08 DIAGNOSIS — E785 Hyperlipidemia, unspecified: Secondary | ICD-10-CM | POA: Diagnosis present

## 2014-02-08 DIAGNOSIS — A630 Anogenital (venereal) warts: Secondary | ICD-10-CM | POA: Diagnosis present

## 2014-02-08 DIAGNOSIS — F4310 Post-traumatic stress disorder, unspecified: Secondary | ICD-10-CM | POA: Diagnosis present

## 2014-02-08 DIAGNOSIS — K219 Gastro-esophageal reflux disease without esophagitis: Secondary | ICD-10-CM | POA: Diagnosis present

## 2014-02-08 DIAGNOSIS — I2510 Atherosclerotic heart disease of native coronary artery without angina pectoris: Secondary | ICD-10-CM | POA: Diagnosis present

## 2014-02-08 HISTORY — PX: INCISION AND DRAINAGE ABSCESS: SHX5864

## 2014-02-08 LAB — SURGICAL PCR SCREEN
MRSA, PCR: NEGATIVE
STAPHYLOCOCCUS AUREUS: NEGATIVE

## 2014-02-08 LAB — BASIC METABOLIC PANEL
Anion gap: 4 — ABNORMAL LOW (ref 5–15)
BUN: 10 mg/dL (ref 6–23)
CALCIUM: 8.4 mg/dL (ref 8.4–10.5)
CHLORIDE: 105 mmol/L (ref 96–112)
CO2: 27 mmol/L (ref 19–32)
Creatinine, Ser: 0.81 mg/dL (ref 0.50–1.10)
GFR calc Af Amer: >90 mL/min (ref >90)
GFR calc non Af Amer: 83 mL/min — ABNORMAL LOW (ref >90)
Glucose, Bld: 112 mg/dL — ABNORMAL HIGH (ref 70–99)
POTASSIUM: 3.6 mmol/L (ref 3.5–5.1)
Sodium: 136 mmol/L (ref 135–145)

## 2014-02-08 LAB — CBC
HCT: 35.3 % — ABNORMAL LOW (ref 36.0–46.0)
Hemoglobin: 12 g/dL (ref 12.0–15.0)
MCH: 28.1 pg (ref 26.0–34.0)
MCHC: 34 g/dL (ref 30.0–36.0)
MCV: 82.7 fL (ref 78.0–100.0)
PLATELETS: 237 10*3/uL (ref 150–400)
RBC: 4.27 MIL/uL (ref 3.87–5.11)
RDW: 13.9 % (ref 11.5–15.5)
WBC: 10 10*3/uL (ref 4.0–10.5)

## 2014-02-08 LAB — GLUCOSE, CAPILLARY
GLUCOSE-CAPILLARY: 100 mg/dL — AB (ref 70–99)
GLUCOSE-CAPILLARY: 114 mg/dL — AB (ref 70–99)
Glucose-Capillary: 97 mg/dL (ref 70–99)

## 2014-02-08 SURGERY — INCISION AND DRAINAGE, ABSCESS
Anesthesia: General | Site: Breast | Laterality: Right

## 2014-02-08 MED ORDER — HYDROMORPHONE HCL 1 MG/ML IJ SOLN
0.2500 mg | INTRAMUSCULAR | Status: DC | PRN
Start: 2014-02-08 — End: 2014-02-08
  Administered 2014-02-08 (×2): 0.5 mg via INTRAVENOUS

## 2014-02-08 MED ORDER — CARVEDILOL 6.25 MG PO TABS
6.2500 mg | ORAL_TABLET | Freq: Two times a day (BID) | ORAL | Status: DC
  Administered 2014-02-09 – 2014-02-10 (×3): 6.25 mg via ORAL
  Filled 2014-02-08 (×5): qty 1

## 2014-02-08 MED ORDER — FLUTICASONE PROPIONATE 50 MCG/ACT NA SUSP: 1.0000 | Freq: Every day | NASAL | Status: DC

## 2014-02-08 MED ORDER — LACTATED RINGERS IV SOLN
INTRAVENOUS | Status: DC | PRN
  Administered 2014-02-08: 20:00:00 via INTRAVENOUS

## 2014-02-08 MED ORDER — SIMVASTATIN 40 MG PO TABS
40.0000 mg | ORAL_TABLET | Freq: Every day | ORAL | Status: DC
  Administered 2014-02-09: 40 mg via ORAL
  Filled 2014-02-08 (×2): qty 1

## 2014-02-08 MED ORDER — MEPERIDINE HCL 25 MG/ML IJ SOLN: 6.2500 mg | INTRAMUSCULAR | Status: DC | PRN

## 2014-02-08 MED ORDER — LISINOPRIL 10 MG PO TABS
10.0000 mg | ORAL_TABLET | Freq: Every day | ORAL | Status: DC
  Administered 2014-02-09 – 2014-02-10 (×2): 10 mg via ORAL
  Filled 2014-02-08 (×2): qty 1

## 2014-02-08 MED ORDER — CANAGLIFLOZIN 100 MG PO TABS
100.0000 mg | ORAL_TABLET | Freq: Every day | ORAL | Status: DC
  Administered 2014-02-09 – 2014-02-10 (×2): 100 mg via ORAL
  Filled 2014-02-08 (×2): qty 1

## 2014-02-08 MED ORDER — OXYCODONE HCL 5 MG/5ML PO SOLN: 5.0000 mg | Freq: Once | ORAL | Status: AC | PRN

## 2014-02-08 MED ORDER — FENTANYL CITRATE 0.05 MG/ML IJ SOLN
INTRAMUSCULAR | Status: AC
  Filled 2014-02-08: qty 5

## 2014-02-08 MED ORDER — GABAPENTIN 300 MG PO CAPS
600.0000 mg | ORAL_CAPSULE | Freq: Two times a day (BID) | ORAL | Status: DC
  Administered 2014-02-08 – 2014-02-10 (×4): 600 mg via ORAL
  Filled 2014-02-08 (×6): qty 2

## 2014-02-08 MED ORDER — INSULIN ASPART 100 UNIT/ML ~~LOC~~ SOLN
4.0000 [IU] | Freq: Three times a day (TID) | SUBCUTANEOUS | Status: DC
  Administered 2014-02-09 – 2014-02-10 (×4): 4 [IU] via SUBCUTANEOUS

## 2014-02-08 MED ORDER — TRAZODONE HCL 50 MG PO TABS: 50.0000 mg | ORAL_TABLET | Freq: Every evening | ORAL | Status: DC | PRN

## 2014-02-08 MED ORDER — ACYCLOVIR 400 MG PO TABS
400.0000 mg | ORAL_TABLET | Freq: Every day | ORAL | Status: DC
  Administered 2014-02-09 – 2014-02-10 (×2): 400 mg via ORAL
  Filled 2014-02-08 (×2): qty 1

## 2014-02-08 MED ORDER — PROPOFOL 10 MG/ML IV BOLUS
INTRAVENOUS | Status: DC | PRN
  Administered 2014-02-08: 200 mg via INTRAVENOUS

## 2014-02-08 MED ORDER — ASPIRIN EC 81 MG PO TBEC
81.0000 mg | DELAYED_RELEASE_TABLET | Freq: Every day | ORAL | Status: DC
  Administered 2014-02-09 – 2014-02-10 (×2): 81 mg via ORAL
  Filled 2014-02-08 (×2): qty 1

## 2014-02-08 MED ORDER — ONDANSETRON HCL 4 MG/2ML IJ SOLN
INTRAMUSCULAR | Status: DC | PRN
  Administered 2014-02-08: 4 mg via INTRAVENOUS

## 2014-02-08 MED ORDER — 0.9 % SODIUM CHLORIDE (POUR BTL) OPTIME
TOPICAL | Status: DC | PRN
  Administered 2014-02-08: 1000 mL

## 2014-02-08 MED ORDER — FENTANYL CITRATE 0.05 MG/ML IJ SOLN
INTRAMUSCULAR | Status: DC | PRN
  Administered 2014-02-08 (×3): 50 ug via INTRAVENOUS

## 2014-02-08 MED ORDER — INSULIN ASPART 100 UNIT/ML ~~LOC~~ SOLN
0.0000 [IU] | Freq: Every day | SUBCUTANEOUS | Status: DC
Start: 2014-02-08 — End: 2014-02-10

## 2014-02-08 MED ORDER — ONDANSETRON HCL 4 MG/2ML IJ SOLN: 4.0000 mg | Freq: Four times a day (QID) | INTRAMUSCULAR | Status: DC | PRN

## 2014-02-08 MED ORDER — OXYCODONE HCL 5 MG PO TABS
5.0000 mg | ORAL_TABLET | Freq: Once | ORAL | Status: AC | PRN
  Administered 2014-02-08: 5 mg via ORAL

## 2014-02-08 MED ORDER — MORPHINE SULFATE 2 MG/ML IJ SOLN
2.0000 mg | INTRAMUSCULAR | Status: DC | PRN
  Administered 2014-02-08: 4 mg via INTRAVENOUS
  Administered 2014-02-08: 2 mg via INTRAVENOUS
  Administered 2014-02-09: 4 mg via INTRAVENOUS
  Filled 2014-02-08: qty 2
  Filled 2014-02-08: qty 1
  Filled 2014-02-08: qty 2

## 2014-02-08 MED ORDER — ISOSORBIDE MONONITRATE 15 MG HALF TABLET
15.0000 mg | ORAL_TABLET | Freq: Every day | ORAL | Status: DC
  Administered 2014-02-09 – 2014-02-10 (×2): 15 mg via ORAL
  Filled 2014-02-08 (×2): qty 1

## 2014-02-08 MED ORDER — HYDROMORPHONE HCL 1 MG/ML IJ SOLN
INTRAMUSCULAR | Status: AC
  Filled 2014-02-08: qty 1

## 2014-02-08 MED ORDER — NITROGLYCERIN 0.4 MG SL SUBL: 0.4000 mg | SUBLINGUAL_TABLET | SUBLINGUAL | Status: DC | PRN

## 2014-02-08 MED ORDER — LIDOCAINE HCL (CARDIAC) 20 MG/ML IV SOLN
INTRAVENOUS | Status: DC | PRN
  Administered 2014-02-08: 100 mg via INTRAVENOUS

## 2014-02-08 MED ORDER — SODIUM CHLORIDE 0.9 % IV SOLN
INTRAVENOUS | Status: DC
  Administered 2014-02-08 (×2): via INTRAVENOUS

## 2014-02-08 MED ORDER — OXYCODONE HCL 5 MG PO TABS
5.0000 mg | ORAL_TABLET | Freq: Four times a day (QID) | ORAL | Status: DC | PRN
  Administered 2014-02-08 – 2014-02-09 (×3): 5 mg via ORAL
  Filled 2014-02-08 (×3): qty 1

## 2014-02-08 MED ORDER — PROPOFOL 10 MG/ML IV BOLUS
INTRAVENOUS | Status: AC
  Filled 2014-02-08: qty 20

## 2014-02-08 MED ORDER — MIDAZOLAM HCL 5 MG/5ML IJ SOLN
INTRAMUSCULAR | Status: DC | PRN
  Administered 2014-02-08: 2 mg via INTRAVENOUS

## 2014-02-08 MED ORDER — PROMETHAZINE HCL 25 MG/ML IJ SOLN: 6.2500 mg | INTRAMUSCULAR | Status: DC | PRN

## 2014-02-08 MED ORDER — MIDAZOLAM HCL 2 MG/2ML IJ SOLN
INTRAMUSCULAR | Status: AC
  Filled 2014-02-08: qty 2

## 2014-02-08 MED ORDER — INSULIN ASPART 100 UNIT/ML ~~LOC~~ SOLN
0.0000 [IU] | Freq: Three times a day (TID) | SUBCUTANEOUS | Status: DC
  Administered 2014-02-09 (×3): 2 [IU] via SUBCUTANEOUS

## 2014-02-08 MED ORDER — PIPERACILLIN-TAZOBACTAM 3.375 G IVPB
3.3750 g | Freq: Three times a day (TID) | INTRAVENOUS | Status: DC
  Administered 2014-02-08 – 2014-02-10 (×6): 3.375 g via INTRAVENOUS
  Filled 2014-02-08 (×10): qty 50

## 2014-02-08 MED ORDER — METFORMIN HCL ER 500 MG PO TB24
500.0000 mg | ORAL_TABLET | Freq: Every day | ORAL | Status: DC
  Administered 2014-02-09 – 2014-02-10 (×2): 500 mg via ORAL
  Filled 2014-02-08 (×3): qty 1

## 2014-02-08 MED ORDER — OXYCODONE HCL 5 MG PO TABS
ORAL_TABLET | ORAL | Status: AC
  Administered 2014-02-09: 5 mg via ORAL
  Filled 2014-02-08: qty 1

## 2014-02-08 SURGICAL SUPPLY — 27 items
BLADE SURG ROTATE 9660 (MISCELLANEOUS) IMPLANT
BNDG GAUZE ELAST 4 BULKY (GAUZE/BANDAGES/DRESSINGS) IMPLANT
CANISTER SUCTION 2500CC (MISCELLANEOUS) ×3 IMPLANT
COVER SURGICAL LIGHT HANDLE (MISCELLANEOUS) ×3 IMPLANT
DRAPE LAPAROSCOPIC ABDOMINAL (DRAPES) IMPLANT
DRAPE PED LAPAROTOMY (DRAPES) IMPLANT
DRSG PAD ABDOMINAL 8X10 ST (GAUZE/BANDAGES/DRESSINGS) IMPLANT
ELECT REM PT RETURN 9FT ADLT (ELECTROSURGICAL) ×3
ELECTRODE REM PT RTRN 9FT ADLT (ELECTROSURGICAL) ×1 IMPLANT
GAUZE SPONGE 4X4 12PLY STRL (GAUZE/BANDAGES/DRESSINGS) IMPLANT
GLOVE BIO SURGEON STRL SZ7 (GLOVE) ×3 IMPLANT
GLOVE BIOGEL PI IND STRL 7.5 (GLOVE) ×1 IMPLANT
GLOVE BIOGEL PI INDICATOR 7.5 (GLOVE) ×2
GOWN STRL REUS W/ TWL LRG LVL3 (GOWN DISPOSABLE) ×2 IMPLANT
GOWN STRL REUS W/TWL LRG LVL3 (GOWN DISPOSABLE) ×6
KIT BASIN OR (CUSTOM PROCEDURE TRAY) ×3 IMPLANT
KIT ROOM TURNOVER OR (KITS) ×3 IMPLANT
NS IRRIG 1000ML POUR BTL (IV SOLUTION) ×3 IMPLANT
PACK GENERAL/GYN (CUSTOM PROCEDURE TRAY) ×3 IMPLANT
PAD ABD 8X10 STRL (GAUZE/BANDAGES/DRESSINGS) ×2 IMPLANT
PAD ARMBOARD 7.5X6 YLW CONV (MISCELLANEOUS) ×3 IMPLANT
SPONGE GAUZE 4X4 12PLY STER LF (GAUZE/BANDAGES/DRESSINGS) ×2 IMPLANT
SWAB COLLECTION DEVICE MRSA (MISCELLANEOUS) IMPLANT
TAPE CLOTH SURG 6X10 WHT LF (GAUZE/BANDAGES/DRESSINGS) ×2 IMPLANT
TOWEL OR 17X24 6PK STRL BLUE (TOWEL DISPOSABLE) ×3 IMPLANT
TOWEL OR 17X26 10 PK STRL BLUE (TOWEL DISPOSABLE) ×3 IMPLANT
TUBE ANAEROBIC SPECIMEN COL (MISCELLANEOUS) IMPLANT

## 2014-02-08 NOTE — Op Note (Signed)
Pre-op Diagnosis - Recurrent right breast abscess Post-op Diagnosis:  Same Procedure:  Incision and drain of right breast abscess Surgeon:  Donnie Mesa K. Anesthesia:  GEN Indications:  49 yo female with multiple medical comorbidities presents with recurrent right breast abscess located behind her right nipple.  She is admitted to the hospital for IV abx and for incision and drainage of the abscess.  Description of procedure:  The patient is brought to the operating room and placed in a supine position on the operating room table.  After an adequate level of anesthesia was obtained, her right breast was prepped with Betadine and draped in sterile fashion. A timeout was taken to ensure the proper patient and proper procedure. The patient has purulent drainage, from her right nipple. The upper half of the areole seems indurated and fluctuant. She has a circumareolar incision around the upper part of her nipple. This is slightly contracted. We open this incision and I dissected down into the breast tissue. The purulent fluid is located directly behind her nipple. We excised a lot of the granulation tissue from this area. The fluid was cultured. We debrided some granulation and scar tissue from the retroareolar region and sent this for pathologic examination. The surrounding breast tissue appears relatively normal and healthy. I undermined the scar to release the contraction. We irrigated the wound thoroughly with sterile saline. Cautery was used for hemostasis. The wound was packed with two saline moistened 4 x 4 gauze pads. A dry dressing was applied. The patient was extubated and brought to recovery room in stable condition. All sponge, initially, and needle counts are correct.    Imogene Burn. Georgette Dover, MD, [facility] Surgery  General/ Trauma Surgery  02/08/2014 8:51 PM

## 2014-02-08 NOTE — Transfer of Care (Signed)
Immediate Anesthesia Transfer of Care Note  Patient: Cassidy [name]  Procedure(s) Performed: Procedure(s): INCISION AND DRAINAGE RIGHT BREAST ABSCESS (Right)  Patient Location: PACU  Anesthesia Type:General  Level of Consciousness: awake, alert , oriented and patient cooperative  Airway & Oxygen Therapy: Patient Spontanous Breathing and Patient connected to nasal cannula oxygen  Post-op Assessment: Report given to RN, Post -op Vital signs reviewed and stable, Patient moving all extremities and Patient moving all extremities X 4  Post vital signs: Reviewed and stable  Last Vitals:  Filed Vitals:   02/08/14 2100  BP:   Pulse:   Temp: 37.2 C  Resp:     Complications: No apparent anesthesia complications

## 2014-02-08 NOTE — H&P (Signed)
Cassidy [name] is an 49 y.o. female.   Chief Complaint:  Right breast abscess HPI: 49 yo female with history of multiple previous surgeries for right breast abscesses presents with several days of worsening right breast tenderness, swelling, and drainage.  This has become very tender and there is some redness spreading across the middle of her breast.  She presented to the office for evaluation and we are directly admitting her for surgery.  Past Medical History  Diagnosis Date  . Hypertension   . GERD (gastroesophageal reflux disease)   . Breast abscess     a. right side.   Marland Kitchen HLD (hyperlipidemia)   . Type II diabetes mellitus   . Anemia   . Sickle cell trait   . Fibromyalgia   . Genital warts   . Diabetic peripheral neuropathy   . Depression   . Bipolar depression   . PTSD (post-traumatic stress disorder)   . Tooth caries   . Pain in limb     a. LE VENOUS DUPLEX, 02/05/2009 - no evidence of deep vein thrombosis, Baker's cyst  . CAD (coronary artery disease)     a.  s/p PCTA to dRCA and ostium of branch extending into the PLA vessel and PDA branch (05/2012)  b. [place] s/p DES to mLAD  . Tobacco abuse     Past Surgical History  Procedure Laterality Date  . Bladder surgery  1980    "TVT"  . Irrigation and debridement abscess  12/19/2010    Procedure: IRRIGATION AND DEBRIDEMENT ABSCESS;  Surgeon: Rolm Bookbinder, MD;  Location: [place];  Service: General;  Laterality: Right;  . Incise and drain abcess  ; 04/26/11; 08/13/11    right breast  . Tonsillectomy  1988  . Refractive surgery  ~ 2010    right  . Irrigation and debridement abscess  08/13/2011    Procedure: MINOR INCISION AND DRAINAGE OF ABSCESS;  Surgeon: Gwenyth Ober, MD;  Location: [place];  Service: General;  Laterality: Right;  Right Breast   . Irrigation and debridement abscess  11/17/2011    Procedure: IRRIGATION AND DEBRIDEMENT ABSCESS;  Surgeon: Imogene Burn. Georgette Dover, MD;  Location: [place] [facility];  Service: General;  Laterality: Right;   irrigation and debridement right recurrent breast abscess  . Breast surgery Right     I&D for multiple abscesses  . Eye surgery Left     laser surgery  . Larynx surgery    . Ployp removed from voice box 03/30/12    . Incision and drainage abscess Right 05/09/2012    Procedure: INCISION AND DRAINAGE RIGHT BREAST ABSCESS;  Surgeon: Imogene Burn. Georgette Dover, MD;  Location: [facility];  Service: General;  Laterality: Right;  . Nm myoview ltd  01/26/2009    Normal study, no evidence of ischemia, EF 67%  . Cutting balloon    . Cardiac catheterization  05/2012    see medical Hx.  . Coronary stent placement  11/08/2013    LAD   DES        dr harding   . Left heart catheterization with coronary angiogram N/A 05/29/2012    Procedure: LEFT HEART CATHETERIZATION WITH CORONARY ANGIOGRAM;  Surgeon: Troy Sine, MD;  Location: [facility] CATH LAB;  Service: Cardiovascular;  Laterality: N/A;  . Left heart catheterization with coronary angiogram N/A 11/08/2013    Procedure: LEFT HEART CATHETERIZATION WITH CORONARY ANGIOGRAM;  Surgeon: Leonie Man, MD;  Location: [facility] CATH LAB;  Service: Cardiovascular;  Laterality: N/A;  Family History  Problem Relation Age of Onset  . COPD Mother   . Cancer Mother     lymphoma  . Heart disease Mother   . Cancer Maternal Aunt     breast, colon  . Hyperlipidemia Father   . Heart disease Father   . Hypertension Brother   . Hypertension Maternal Grandmother   . Diabetes Maternal Grandmother    Social History:  reports that she has been smoking Cigarettes.  She has a 7.5 pack-year smoking history. She has never used smokeless tobacco. She reports that she does not drink alcohol or use illicit drugs.  Allergies: No Known Allergies  Prior to Admission medications   Medication Sig Start Date End Date Taking? Authorizing Provider  acyclovir (ZOVIRAX) 400 MG tablet Take 1 tablet by mouth daily. 11/09/13   Historical Provider, MD  aspirin EC 81 MG tablet Take 81 mg by mouth daily.     Historical Provider, MD  canagliflozin (INVOKANA) 100 MG TABS tablet Take 100 mg by mouth daily.    Historical Provider, MD  carvedilol (COREG) 6.25 MG tablet TAKE 1 TABLET BY MOUTH TWICE DAILY WITH A MEAL. 11/07/13   Leonie Man, MD  clopidogrel (PLAVIX) 75 MG tablet TAKE 1 TABLET BY MOUTH EVERY DAY. 12/17/13   Leonie Man, MD  Cyanocobalamin (B-12 PO) Take 400 mcg by mouth daily.     Historical Provider, MD  fluconazole (DIFLUCAN) 150 MG tablet Take 150 mg by mouth as needed (for yeast infections caused by Invokana).    Historical Provider, MD  gabapentin (NEURONTIN) 300 MG capsule Take 600 mg by mouth 2 (two) times daily.     Historical Provider, MD  insulin aspart (NOVOLOG) 100 UNIT/ML injection Inject 15 Units into the skin 3 (three) times daily before meals.    Historical Provider, MD  Insulin Syringe-Needle U-100 (INSULIN SYRINGE 1CC/31GX5/16") 31G X 5/16" 1 ML MISC USE AS DIRECTED FOUR TIMES DAILY FOR INSULIN INJECTIONS 01/10/14   Philemon Kingdom, MD  Insulin Syringe-Needle U-100 30G X 5/16" 1 ML MISC Use as directed 4 times daily for insulin injections. 11/28/12   Philemon Kingdom, MD  isosorbide mononitrate (IMDUR) 30 MG 24 hr tablet Take 0.5 tablets (15 mg total) by mouth daily. 11/29/13   Brittainy Erie Noe, PA-C  LEVEMIR 100 UNIT/ML injection INJECT 0.35 MLS INTO THE SKIN AT BEDTIME 01/13/14   Philemon Kingdom, MD  LEVEMIR 100 UNIT/ML injection INJECT 0.35 MLS INTO THE SKIN AT BEDTIME 01/13/14   Philemon Kingdom, MD  lisinopril (PRINIVIL,ZESTRIL) 10 MG tablet Take 10 mg by mouth daily.    Historical Provider, MD  metFORMIN (GLUCOPHAGE-XR) 500 MG 24 hr tablet Take 1 tablet (500 mg total) by mouth daily with breakfast. 11/11/13   Eileen Stanford, PA-C  nitroGLYCERIN (NITROSTAT) 0.4 MG SL tablet Place 1 tablet (0.4 mg total) under the tongue every 5 (five) minutes as needed for chest pain. 11/09/13   Eileen Stanford, PA-C  omeprazole (PRILOSEC) 20 MG capsule Take 20 mg by mouth  daily.    Historical Provider, MD  oxyCODONE (ROXICODONE) 5 MG immediate release tablet Take 1 tablet (5 mg total) by mouth every 6 (six) hours as needed for severe pain. 02/06/14   Montine Circle, PA-C  pregabalin (LYRICA) 75 MG capsule Take 75 mg by mouth 2 (two) times daily.    Historical Provider, MD  simvastatin (ZOCOR) 40 MG tablet Take 1 tablet (40 mg total) by mouth daily at 6 PM. 11/09/13   Woodfin Ganja  Grandville Silos, PA-C  traZODone (DESYREL) 100 MG tablet Take 50 mg by mouth at bedtime as needed for sleep.     Historical Provider, MD  triamcinolone cream (KENALOG) 0.1 % Apply 1 application topically daily. 08/22/13   Historical Provider, MD     Results for orders placed or performed during the hospital encounter of 02/06/14 (from the past 48 hour(s))  CBC with Differential     Status: None   Collection Time: 02/06/14  4:17 PM  Result Value Ref Range   WBC 9.9 4.0 - 10.5 K/uL   RBC 4.70 3.87 - 5.11 MIL/uL   Hemoglobin 13.4 12.0 - 15.0 g/dL   HCT 39.0 36.0 - 46.0 %   MCV 83.0 78.0 - 100.0 fL   MCH 28.5 26.0 - 34.0 pg   MCHC 34.4 30.0 - 36.0 g/dL   RDW 13.9 11.5 - 15.5 %   Platelets 290 150 - 400 K/uL   Neutrophils Relative % 69 43 - 77 %   Neutro Abs 6.8 1.7 - 7.7 K/uL   Lymphocytes Relative 26 12 - 46 %   Lymphs Abs 2.6 0.7 - 4.0 K/uL   Monocytes Relative 4 3 - 12 %   Monocytes Absolute 0.4 0.1 - 1.0 K/uL   Eosinophils Relative 1 0 - 5 %   Eosinophils Absolute 0.1 0.0 - 0.7 K/uL   Basophils Relative 0 0 - 1 %   Basophils Absolute 0.0 0.0 - 0.1 K/uL  Comprehensive metabolic panel     Status: Abnormal   Collection Time: 02/06/14  4:17 PM  Result Value Ref Range   Sodium 139 135 - 145 mmol/L   Potassium 4.0 3.5 - 5.1 mmol/L   Chloride 108 96 - 112 mmol/L   CO2 25 19 - 32 mmol/L   Glucose, Bld 211 (H) 70 - 99 mg/dL   BUN 14 6 - 23 mg/dL   Creatinine, Ser 0.91 0.50 - 1.10 mg/dL   Calcium 8.9 8.4 - 10.5 mg/dL   Total Protein 7.5 6.0 - 8.3 g/dL   Albumin 4.0 3.5 - 5.2 g/dL    AST 17 0 - 37 U/L   ALT 11 0 - 35 U/L   Alkaline Phosphatase 80 39 - 117 U/L   Total Bilirubin 0.6 0.3 - 1.2 mg/dL   GFR calc non Af Amer 72 (L) >90 mL/min   GFR calc Af Amer 84 (L) >90 mL/min    Comment: (NOTE) The eGFR has been calculated using the CKD EPI equation. This calculation has not been validated in all clinical situations. eGFR's persistently <90 mL/min signify possible Chronic Kidney Disease.    Anion gap 6 5 - 15   No results found.  ROS  There were no vitals taken for this visit. Physical Exam  WDWN - uncomfortable HEENT:  EOMI, sclera anicteric Neck:  No masses, no thyromegaly Right breast - contracted scar; central breast erythema; purulent drainage coming from the nipple; exquisitely tender Lungs:  CTA bilaterally; normal respiratory effort CV:  Regular rate and rhythm; no murmurs Abd:  +bowel sounds, soft, non-tender, no masses Ext:  Well-perfused; no edema Skin:  Warm, dry; no sign of jaundice Assessment/Plan Recurrent right breast abscess Anticoagulated with ASA/ Plavix   Will admit for IV abx and surgical incision and drainage tonight.  The surgical procedure has been discussed with the patient.  Potential risks, benefits, alternative treatments, and expected outcomes have been explained.  All of the patient's questions at this time have been answered.  The likelihood  of reaching the patient's treatment goal is good.  The patient understand the proposed surgical procedure and wishes to proceed.   Darnell Stimson K. 02/08/2014, 1:14 PM

## 2014-02-08 NOTE — Anesthesia Postprocedure Evaluation (Signed)
Anesthesia Post Note  Patient: Cassidy [name]  Procedure(s) Performed: Procedure(s) (LRB): INCISION AND DRAINAGE RIGHT BREAST ABSCESS (Right)  Anesthesia type: General  Patient location: PACU  Post pain: Pain level controlled  Post assessment: Post-op Vital signs reviewed  Last Vitals: BP 118/67 mmHg  Pulse 86  Temp(Src) 37.2 C (Oral)  Resp 16  Ht 5\' 8"  (1.727 m)  Wt 241 lb 3.2 oz (109.408 kg)  BMI 36.68 kg/m2  SpO2 96%  Post vital signs: Reviewed  Level of consciousness: sedated  Complications: No apparent anesthesia complications

## 2014-02-08 NOTE — Anesthesia Preprocedure Evaluation (Addendum)
Anesthesia Evaluation  Patient identified by MRN, date of birth, ID band Patient awake    Reviewed: Allergy & Precautions, H&P , NPO status , Patient's Chart, lab work & pertinent test results, reviewed documented beta blocker date and time   History of Anesthesia Complications (+) history of anesthetic complications  Airway Mallampati: II  TM Distance: >3 FB Neck ROM: full    Dental   Pulmonary asthma , Current Smoker,  breath sounds clear to auscultation        Cardiovascular hypertension, On Medications + angina + CAD and + Cardiac Stents Rhythm:regular  LHC and PCI 11/08/2013 Integrity Resolute DES 3.0 mm x 22 mm placed in mid LAD     Neuro/Psych PSYCHIATRIC DISORDERS Depression Bipolar Disorder  Neuromuscular disease    GI/Hepatic Neg liver ROS, GERD-  Medicated and Controlled,  Endo/Other  diabetes, Oral Hypoglycemic Agents  Renal/GU negative Renal ROS  negative genitourinary   Musculoskeletal  (+) Fibromyalgia -  Abdominal   Peds  Hematology  (+) Sickle cell trait and anemia ,   Anesthesia Other Findings See surgeon's H&P   Reproductive/Obstetrics negative OB ROS                          Anesthesia Physical  Anesthesia Plan  ASA: IV and emergent  Anesthesia Plan: General   Post-op Pain Management:    Induction: Intravenous and Rapid sequence  Airway Management Planned: LMA and Oral ETT  Additional Equipment:   Intra-op Plan:   Post-operative Plan: Extubation in OR  Informed Consent: I have reviewed the patients History and Physical, chart, labs and discussed the procedure including the risks, benefits and alternatives for the proposed anesthesia with the patient or authorized representative who has indicated his/her understanding and acceptance.   Dental Advisory Given  Plan Discussed with: CRNA  Anesthesia Plan Comments:        Anesthesia Quick Evaluation

## 2014-02-08 NOTE — H&P (Signed)
Cassidy [name] is an 49 y.o. female.  Chief Complaint: Right breast abscess HPI: 49 yo female with history of multiple previous surgeries for right breast abscesses presents with several days of worsening right breast tenderness, swelling, and drainage. This has become very tender and there is some redness spreading across the middle of her breast. She presented to the office for evaluation and we are directly admitting her for surgery.  Past Medical History  Diagnosis Date  . Hypertension   . GERD (gastroesophageal reflux disease)   . Breast abscess     a. right side.   Marland Kitchen HLD (hyperlipidemia)   . Type II diabetes mellitus   . Anemia   . Sickle cell trait   . Fibromyalgia   . Genital warts   . Diabetic peripheral neuropathy   . Depression   . Bipolar depression   . PTSD (post-traumatic stress disorder)   . Tooth caries   . Pain in limb     a. LE VENOUS DUPLEX, 02/05/2009 - no evidence of deep vein thrombosis, Baker's cyst  . CAD (coronary artery disease)     a. s/p PCTA to dRCA and ostium of branch extending into the PLA vessel and PDA branch (05/2012) b. [place] s/p DES to mLAD  . Tobacco abuse     Past Surgical History  Procedure Laterality Date  . Bladder surgery  1980    "TVT"  . Irrigation and debridement abscess  12/19/2010    Procedure: IRRIGATION AND DEBRIDEMENT ABSCESS; Surgeon: Rolm Bookbinder, MD; Location: [place]; Service: General; Laterality: Right;  . Incise and drain abcess  ; 04/26/11; 08/13/11    right breast  . Tonsillectomy  1988  . Refractive surgery  ~ 2010    right  . Irrigation and debridement abscess  08/13/2011    Procedure: MINOR INCISION AND DRAINAGE OF ABSCESS; Surgeon: Gwenyth Ober, MD; Location: [place]; Service: General; Laterality: Right; Right Breast   . Irrigation and debridement abscess  11/17/2011    Procedure:  IRRIGATION AND DEBRIDEMENT ABSCESS; Surgeon: Imogene Burn. Georgette Dover, MD; Location: [place] [facility]; Service: General; Laterality: Right; irrigation and debridement right recurrent breast abscess  . Breast surgery Right     I&D for multiple abscesses  . Eye surgery Left     laser surgery  . Larynx surgery    . Ployp removed from voice box 03/30/12    . Incision and drainage abscess Right 05/09/2012    Procedure: INCISION AND DRAINAGE RIGHT BREAST ABSCESS; Surgeon: Imogene Burn. Georgette Dover, MD; Location: [place]; Service: General; Laterality: Right;  . Nm myoview ltd  01/26/2009    Normal study, no evidence of ischemia, EF 67%  . Cutting balloon    . Cardiac catheterization  05/2012    see medical Hx.  . Coronary stent placement  11/08/2013    LAD DES dr harding   . Left heart catheterization with coronary angiogram N/A 05/29/2012    Procedure: LEFT HEART CATHETERIZATION WITH CORONARY ANGIOGRAM; Surgeon: Troy Sine, MD; Location: [facility] C[facility] LAB; Service: Cardiovascular; Laterality: N/A;  . Left heart catheterization with coronary angiogram N/A 11/08/2013    Procedure: LEFT HEART CATHETERIZATION WITH CORONARY ANGIOGRAM; Surgeon: Leonie Man, MD; Location: [facility] CATH LAB; Service: Cardiovascular; Laterality: N/A;    Family History  Problem Relation Age of Onset  . COPD Mother   . Cancer Mother     lymphoma  . Heart disease Mother   . Cancer Maternal Aunt     breast, colon  .  Hyperlipidemia Father   . Heart disease Father   . Hypertension Brother   . Hypertension Maternal Grandmother   . Diabetes Maternal Grandmother    Social History:  reports that she has been smoking Cigarettes. She has a 7.5 pack-year smoking history. She has never used smokeless tobacco. She reports that she does not drink alcohol or use illicit drugs.  Allergies: No Known Allergies  Prior to  Admission medications   Medication Sig Start Date End Date Taking? Authorizing Provider  acyclovir (ZOVIRAX) 400 MG tablet Take 1 tablet by mouth daily. 11/09/13   Historical Provider, MD  aspirin EC 81 MG tablet Take 81 mg by mouth daily.    Historical Provider, MD  canagliflozin (INVOKANA) 100 MG TABS tablet Take 100 mg by mouth daily.    Historical Provider, MD  carvedilol (COREG) 6.25 MG tablet TAKE 1 TABLET BY MOUTH TWICE DAILY WITH A MEAL. 11/07/13   Leonie Man, MD  clopidogrel (PLAVIX) 75 MG tablet TAKE 1 TABLET BY MOUTH EVERY DAY. 12/17/13   Leonie Man, MD  Cyanocobalamin (B-12 PO) Take 400 mcg by mouth daily.     Historical Provider, MD  fluconazole (DIFLUCAN) 150 MG tablet Take 150 mg by mouth as needed (for yeast infections caused by Invokana).    Historical Provider, MD  gabapentin (NEURONTIN) 300 MG capsule Take 600 mg by mouth 2 (two) times daily.     Historical Provider, MD  insulin aspart (NOVOLOG) 100 UNIT/ML injection Inject 15 Units into the skin 3 (three) times daily before meals.    Historical Provider, MD  Insulin Syringe-Needle U-100 (INSULIN SYRINGE 1CC/31GX5/16") 31G X 5/16" 1 ML MISC USE AS DIRECTED FOUR TIMES DAILY FOR INSULIN INJECTIONS 01/10/14   Philemon Kingdom, MD  Insulin Syringe-Needle U-100 30G X 5/16" 1 ML MISC Use as directed 4 times daily for insulin injections. 11/28/12   Philemon Kingdom, MD  isosorbide mononitrate (IMDUR) 30 MG 24 hr tablet Take 0.5 tablets (15 mg total) by mouth daily. 11/29/13   Brittainy Erie Noe, PA-C  LEVEMIR 100 UNIT/ML injection INJECT 0.35 MLS INTO THE SKIN AT BEDTIME 01/13/14   Philemon Kingdom, MD  LEVEMIR 100 UNIT/ML injection INJECT 0.35 MLS INTO THE SKIN AT BEDTIME 01/13/14   Philemon Kingdom, MD  lisinopril (PRINIVIL,ZESTRIL) 10 MG tablet Take 10 mg by mouth daily.    Historical Provider, MD  metFORMIN (GLUCOPHAGE-XR) 500 MG 24  hr tablet Take 1 tablet (500 mg total) by mouth daily with breakfast. 11/11/13   Eileen Stanford, PA-C  nitroGLYCERIN (NITROSTAT) 0.4 MG SL tablet Place 1 tablet (0.4 mg total) under the tongue every 5 (five) minutes as needed for chest pain. 11/09/13   Eileen Stanford, PA-C  omeprazole (PRILOSEC) 20 MG capsule Take 20 mg by mouth daily.    Historical Provider, MD  oxyCODONE (ROXICODONE) 5 MG immediate release tablet Take 1 tablet (5 mg total) by mouth every 6 (six) hours as needed for severe pain. 02/06/14   Montine Circle, PA-C  pregabalin (LYRICA) 75 MG capsule Take 75 mg by mouth 2 (two) times daily.    Historical Provider, MD  simvastatin (ZOCOR) 40 MG tablet Take 1 tablet (40 mg total) by mouth daily at 6 PM. 11/09/13   Eileen Stanford, PA-C  traZODone (DESYREL) 100 MG tablet Take 50 mg by mouth at bedtime as needed for sleep.     Historical Provider, MD  triamcinolone cream (KENALOG) 0.1 % Apply 1 application topically daily. 08/22/13   Historical Provider,  MD      Lab Results Last 48 Hours    Results for orders placed or performed during the hospital encounter of 02/06/14 (from the past 48 hour(s))  CBC with Differential Status: None   Collection Time: 02/06/14 4:17 PM  Result Value Ref Range   WBC 9.9 4.0 - 10.5 K/uL   RBC 4.70 3.87 - 5.11 MIL/uL   Hemoglobin 13.4 12.0 - 15.0 g/dL   HCT 39.0 36.0 - 46.0 %   MCV 83.0 78.0 - 100.0 fL   MCH 28.5 26.0 - 34.0 pg   MCHC 34.4 30.0 - 36.0 g/dL   RDW 13.9 11.5 - 15.5 %   Platelets 290 150 - 400 K/uL   Neutrophils Relative % 69 43 - 77 %   Neutro Abs 6.8 1.7 - 7.7 K/uL   Lymphocytes Relative 26 12 - 46 %   Lymphs Abs 2.6 0.7 - 4.0 K/uL   Monocytes Relative 4 3 - 12 %   Monocytes Absolute 0.4 0.1 - 1.0 K/uL   Eosinophils Relative 1 0 - 5 %   Eosinophils Absolute 0.1 0.0 - 0.7 K/uL   Basophils  Relative 0 0 - 1 %   Basophils Absolute 0.0 0.0 - 0.1 K/uL  Comprehensive metabolic panel Status: Abnormal   Collection Time: 02/06/14 4:17 PM  Result Value Ref Range   Sodium 139 135 - 145 mmol/L   Potassium 4.0 3.5 - 5.1 mmol/L   Chloride 108 96 - 112 mmol/L   CO2 25 19 - 32 mmol/L   Glucose, Bld 211 (H) 70 - 99 mg/dL   BUN 14 6 - 23 mg/dL   Creatinine, Ser 0.91 0.50 - 1.10 mg/dL   Calcium 8.9 8.4 - 10.5 mg/dL   Total Protein 7.5 6.0 - 8.3 g/dL   Albumin 4.0 3.5 - 5.2 g/dL   AST 17 0 - 37 U/L   ALT 11 0 - 35 U/L   Alkaline Phosphatase 80 39 - 117 U/L   Total Bilirubin 0.6 0.3 - 1.2 mg/dL   GFR calc non Af Amer 72 (L) >90 mL/min   GFR calc Af Amer 84 (L) >90 mL/min    Comment: (NOTE) The eGFR has been calculated using the CKD EPI equation. This calculation has not been validated in all clinical situations. eGFR's persistently <90 mL/min signify possible Chronic Kidney Disease.    Anion gap 6 5 - 15      Imaging Results (Last 48 hours)    No results found.    ROS  There were no vitals taken for this visit. Physical Exam  WDWN - uncomfortable HEENT: EOMI, sclera anicteric Neck: No masses, no thyromegaly Right breast - contracted scar; central breast erythema; purulent drainage coming from the nipple; exquisitely tender Lungs: CTA bilaterally; normal respiratory effort CV: Regular rate and rhythm; no murmurs Abd: +bowel sounds, soft, non-tender, no masses Ext: Well-perfused; no edema Skin: Warm, dry; no sign of jaundice Assessment/Plan Recurrent right breast abscess Anticoagulated with ASA/ Plavix   Will admit for IV abx and surgical incision and drainage tonight.  The surgical procedure has been discussed with the patient. Potential risks, benefits, alternative treatments, and expected outcomes have been explained. All of the patient's questions at this time have  been answered. The likelihood of reaching the patient's treatment goal is good. The patient understand the proposed surgical procedure and wishes to proceed.   Iqra Rotundo K. 02/08/2014, 1:14 PM

## 2014-02-08 NOTE — Anesthesia Procedure Notes (Signed)
Procedure Name: LMA Insertion Date/Time: 02/08/2014 8:20 PM Performed by: Shirlyn Goltz Pre-anesthesia Checklist: Patient identified, Emergency Drugs available, Suction available and Patient being monitored Patient Re-evaluated:Patient Re-evaluated prior to inductionOxygen Delivery Method: Circle system utilized Preoxygenation: Pre-oxygenation with 100% oxygen Intubation Type: IV induction Ventilation: Mask ventilation without difficulty LMA: LMA inserted LMA Size: 4.0 Number of attempts: 1 Placement Confirmation: breath sounds checked- equal and bilateral and positive ETCO2 Tube secured with: Tape Dental Injury: Teeth and Oropharynx as per pre-operative assessment

## 2014-02-09 DIAGNOSIS — I2510 Atherosclerotic heart disease of native coronary artery without angina pectoris: Secondary | ICD-10-CM | POA: Diagnosis present

## 2014-02-09 DIAGNOSIS — D573 Sickle-cell trait: Secondary | ICD-10-CM | POA: Diagnosis present

## 2014-02-09 DIAGNOSIS — Z7982 Long term (current) use of aspirin: Secondary | ICD-10-CM | POA: Diagnosis not present

## 2014-02-09 DIAGNOSIS — F4310 Post-traumatic stress disorder, unspecified: Secondary | ICD-10-CM | POA: Diagnosis present

## 2014-02-09 DIAGNOSIS — K219 Gastro-esophageal reflux disease without esophagitis: Secondary | ICD-10-CM | POA: Diagnosis present

## 2014-02-09 DIAGNOSIS — E785 Hyperlipidemia, unspecified: Secondary | ICD-10-CM | POA: Diagnosis present

## 2014-02-09 DIAGNOSIS — A630 Anogenital (venereal) warts: Secondary | ICD-10-CM | POA: Diagnosis present

## 2014-02-09 DIAGNOSIS — K029 Dental caries, unspecified: Secondary | ICD-10-CM | POA: Diagnosis present

## 2014-02-09 DIAGNOSIS — Z794 Long term (current) use of insulin: Secondary | ICD-10-CM | POA: Diagnosis not present

## 2014-02-09 DIAGNOSIS — Z955 Presence of coronary angioplasty implant and graft: Secondary | ICD-10-CM | POA: Diagnosis not present

## 2014-02-09 DIAGNOSIS — Z7902 Long term (current) use of antithrombotics/antiplatelets: Secondary | ICD-10-CM | POA: Diagnosis not present

## 2014-02-09 DIAGNOSIS — F319 Bipolar disorder, unspecified: Secondary | ICD-10-CM | POA: Diagnosis present

## 2014-02-09 DIAGNOSIS — Z79899 Other long term (current) drug therapy: Secondary | ICD-10-CM | POA: Diagnosis not present

## 2014-02-09 DIAGNOSIS — D649 Anemia, unspecified: Secondary | ICD-10-CM | POA: Diagnosis present

## 2014-02-09 DIAGNOSIS — M797 Fibromyalgia: Secondary | ICD-10-CM | POA: Diagnosis present

## 2014-02-09 DIAGNOSIS — I10 Essential (primary) hypertension: Secondary | ICD-10-CM | POA: Diagnosis present

## 2014-02-09 DIAGNOSIS — E1142 Type 2 diabetes mellitus with diabetic polyneuropathy: Secondary | ICD-10-CM | POA: Diagnosis present

## 2014-02-09 DIAGNOSIS — Z8249 Family history of ischemic heart disease and other diseases of the circulatory system: Secondary | ICD-10-CM | POA: Diagnosis not present

## 2014-02-09 DIAGNOSIS — F17210 Nicotine dependence, cigarettes, uncomplicated: Secondary | ICD-10-CM | POA: Diagnosis present

## 2014-02-09 DIAGNOSIS — N61 Inflammatory disorders of breast: Secondary | ICD-10-CM | POA: Diagnosis present

## 2014-02-09 LAB — GLUCOSE, CAPILLARY
GLUCOSE-CAPILLARY: 125 mg/dL — AB (ref 70–99)
GLUCOSE-CAPILLARY: 148 mg/dL — AB (ref 70–99)
Glucose-Capillary: 128 mg/dL — ABNORMAL HIGH (ref 70–99)
Glucose-Capillary: 146 mg/dL — ABNORMAL HIGH (ref 70–99)
Glucose-Capillary: 162 mg/dL — ABNORMAL HIGH (ref 70–99)

## 2014-02-09 MED ORDER — IBUPROFEN 600 MG PO TABS
600.0000 mg | ORAL_TABLET | Freq: Four times a day (QID) | ORAL | Status: DC | PRN
  Administered 2014-02-09 (×2): 600 mg via ORAL
  Filled 2014-02-09 (×3): qty 1

## 2014-02-09 MED ORDER — ACETAMINOPHEN 325 MG PO TABS
650.0000 mg | ORAL_TABLET | Freq: Four times a day (QID) | ORAL | Status: DC | PRN
Start: 2014-02-09 — End: 2014-02-10

## 2014-02-09 NOTE — Progress Notes (Signed)
1 Day Post-Op breast I&D Subjective: Felt miserable last night, had some low grade fevers.  Feels better today.    Objective: Vital signs in last 24 hours: Temp:  [98.6 F (37 C)-100.4 F (38 C)] 100.4 F (38 C) (01/30 0615) Pulse Rate:  [80-98] 98 (01/30 0615) Resp:  [14-24] 16 (01/30 0615) BP: (96-146)/(63-85) 96/63 mmHg (01/30 0615) SpO2:  [93 %-100 %] 100 % (01/30 0615) Weight:  [241 lb 3.2 oz (109.408 kg)] 241 lb 3.2 oz (109.408 kg) (01/29 1418)   Intake/Output from previous day: 01/29 0701 - 01/30 0700 In: 2140 [I.V.:2040; IV Piggyback:100] Out: 1650 [Urine:1650] Intake/Output this shift:     General appearance: alert and cooperative  Incision: no significant drainage, no significant erythema  Lab Results:   Recent Labs  02/06/14 1617 02/08/14 1637  WBC 9.9 10.0  HGB 13.4 12.0  HCT 39.0 35.3*  PLT 290 237   BMET  Recent Labs  02/06/14 1617 02/08/14 1637  NA 139 136  K 4.0 3.6  CL 108 105  CO2 25 27  GLUCOSE 211* 112*  BUN 14 10  CREATININE 0.91 0.81  CALCIUM 8.9 8.4   PT/INR No results for input(s): LABPROT, INR in the last 72 hours. ABG No results for input(s): PHART, HCO3 in the last 72 hours.  Invalid input(s): PCO2, PO2  MEDS, Scheduled . acyclovir  400 mg Oral Daily  . aspirin EC  81 mg Oral Daily  . canagliflozin  100 mg Oral Daily  . carvedilol  6.25 mg Oral BID WC  . fluticasone  1 spray Each Nare Daily  . gabapentin  600 mg Oral BID  . HYDROmorphone      . insulin aspart  0-15 Units Subcutaneous TID WC  . insulin aspart  0-5 Units Subcutaneous QHS  . insulin aspart  4 Units Subcutaneous TID WC  . isosorbide mononitrate  15 mg Oral Daily  . lisinopril  10 mg Oral Daily  . metFORMIN  500 mg Oral Q breakfast  . oxyCODONE      . piperacillin-tazobactam (ZOSYN)  IV  3.375 g Intravenous 3 times per day  . simvastatin  40 mg Oral q1800    Studies/Results: No results found.  Assessment: s/p Procedure(s): INCISION AND  DRAINAGE RIGHT BREAST ABSCESS Patient Active Problem List   Diagnosis Date Noted  . CAD (coronary artery disease)   . HLD (hyperlipidemia)   . Type II diabetes mellitus   . Fibromyalgia   . Diabetic peripheral neuropathy   . Bipolar depression   . PTSD (post-traumatic stress disorder)   . Leiomyoma of uterus, unspecified 07/20/2012  . BV (bacterial vaginosis) 07/20/2012  . Breast pain, right 07/17/2012  . Essential hypertension 05/26/2012  . Tobacco abuse 05/26/2012  . Unstable angina pectoris 05/26/2012  . Family history of early CAD 05/26/2012  . Perirectal abscess 03/14/2012  . Abscess of right breast 12/18/2010  . CARPAL TUNNEL SYNDROME, LEFT 12/18/2009  . BREAST MASS, RIGHT 08/13/2008  . INGROWN HAIR 05/03/2008  . TINEA VERSICOLOR 04/06/2007  . ABDOMINAL/PELVIC SWELLING MASS/LUMP EPIGASTRIC 04/06/2007  . BIPOLAR I D/O MOST RECENT EPIS DEPRESSED MOD 11/24/2006  . HERPES, GENITAL NEC 09/30/2006  . SUBSTANCE ABUSE 09/30/2006  . GERD 09/30/2006  . PLANTAR FASCIITIS 09/30/2006     Plan: will do some dressing changes today Cont IV abx If fevers better today, will switch to PO abx tom Poss d/c tom or Mon   LOS: 1 day     .Rosario Adie, MD [facility], [place]   02/09/2014 8:31 AM

## 2014-02-09 NOTE — Progress Notes (Signed)
UR completed 

## 2014-02-10 ENCOUNTER — Encounter (HOSPITAL_COMMUNITY): Payer: Self-pay | Admitting: *Deleted

## 2014-02-10 LAB — GLUCOSE, CAPILLARY
Glucose-Capillary: 129 mg/dL — ABNORMAL HIGH (ref 70–99)
Glucose-Capillary: 149 mg/dL — ABNORMAL HIGH (ref 70–99)

## 2014-02-10 MED ORDER — OXYCODONE HCL 5 MG PO TABS: 5.0000 mg | ORAL_TABLET | Freq: Four times a day (QID) | ORAL | Status: DC | PRN

## 2014-02-10 MED ORDER — SULFAMETHOXAZOLE-TRIMETHOPRIM 400-80 MG PO TABS: 1.0000 | ORAL_TABLET | Freq: Two times a day (BID) | ORAL | Status: DC

## 2014-02-10 NOTE — Discharge Summary (Signed)
Physician Discharge Summary  Patient ID: Cassidy [name] MRN: [id number] DOB/AGE: [birth year] 49 y.o.  Admit date: 02/08/2014 Discharge date: 02/10/2014  Admission Diagnoses:  Breast abscess  Discharge Diagnoses:  Active Problems:   Abscess of right breast   Discharged Condition: good  Hospital Course: pt admitted after surgical debridement.  By POD 2 she was tolerating her dressing changes.  She was set up with home health for dressing changes at home.  She will f/u in the office.    Consults: None  Significant Diagnostic Studies: labs: cbc, chemistry  Treatments: IV hydration, insulin: regular and surgery: see above  Discharge Exam: Blood pressure 112/68, pulse 78, temperature 98.2 F (36.8 C), temperature source Oral, resp. rate 17, height 5\' 8"  (1.727 m), weight 241 lb 3.2 oz (109.408 kg), SpO2 99 %. General appearance: alert and cooperative Incision/Wound: clean  Disposition: 01-Home or Self Care     Medication List    TAKE these medications        acyclovir 400 MG tablet  Commonly known as:  ZOVIRAX  Take 1 tablet by mouth daily.     aspirin EC 81 MG tablet  Take 81 mg by mouth daily.     B-12 PO  Take 400 mcg by mouth daily.     carvedilol 6.25 MG tablet  Commonly known as:  COREG  TAKE 1 TABLET BY MOUTH TWICE DAILY WITH A MEAL.     clopidogrel 75 MG tablet  Commonly known as:  PLAVIX  TAKE 1 TABLET BY MOUTH EVERY DAY.     fluconazole 150 MG tablet  Commonly known as:  DIFLUCAN  Take 150 mg by mouth as needed (for yeast infections caused by Invokana).     fluticasone 50 MCG/ACT nasal spray  Commonly known as:  FLONASE  Place 1 spray into both nostrils daily.     gabapentin 300 MG capsule  Commonly known as:  NEURONTIN  Take 600 mg by mouth 2 (two) times daily.     insulin aspart 100 UNIT/ML injection  Commonly known as:  novoLOG  Inject 15 Units into the skin 3 (three) times daily before meals.     Insulin Syringe-Needle U-100 30G X 5/16" 1  ML Misc  Use as directed 4 times daily for insulin injections.     INSULIN SYRINGE 1CC/31GX5/16" 31G X 5/16" 1 ML Misc  USE AS DIRECTED FOUR TIMES DAILY FOR INSULIN INJECTIONS     INVOKANA 100 MG Tabs tablet  Generic drug:  canagliflozin  Take 100 mg by mouth daily.     isosorbide mononitrate 30 MG 24 hr tablet  Commonly known as:  IMDUR  Take 0.5 tablets (15 mg total) by mouth daily.     LEVEMIR 100 UNIT/ML injection  Generic drug:  insulin detemir  INJECT 0.35 MLS INTO THE SKIN AT BEDTIME     LEVEMIR 100 UNIT/ML injection  Generic drug:  insulin detemir  INJECT 0.35 MLS INTO THE SKIN AT BEDTIME     lisinopril 10 MG tablet  Commonly known as:  PRINIVIL,ZESTRIL  Take 10 mg by mouth daily.     metFORMIN 500 MG 24 hr tablet  Commonly known as:  GLUCOPHAGE-XR  Take 1 tablet (500 mg total) by mouth daily with breakfast.     nitroGLYCERIN 0.4 MG SL tablet  Commonly known as:  NITROSTAT  Place 1 tablet (0.4 mg total) under the tongue every 5 (five) minutes as needed for chest pain.     omeprazole 20 MG capsule  Commonly known as:  PRILOSEC  Take 20 mg by mouth daily.     oxyCODONE 5 MG immediate release tablet  Commonly known as:  ROXICODONE  Take 1 tablet (5 mg total) by mouth every 6 (six) hours as needed for severe pain.     oxyCODONE 5 MG immediate release tablet  Commonly known as:  ROXICODONE  Take 1-2 tablets (5-10 mg total) by mouth every 6 (six) hours as needed for severe pain.     simvastatin 40 MG tablet  Commonly known as:  ZOCOR  Take 1 tablet (40 mg total) by mouth daily at 6 PM.     sulfamethoxazole-trimethoprim 400-80 MG per tablet  Commonly known as:  BACTRIM  Take 1 tablet by mouth 2 (two) times daily.     traZODone 100 MG tablet  Commonly known as:  DESYREL  Take 50 mg by mouth at bedtime as needed for sleep.     triamcinolone cream 0.1 %  Commonly known as:  KENALOG  Apply 1 application topically daily.           Follow-up Information     Follow up with Maia Petties., MD. Schedule an appointment as soon as possible for a visit in 2 weeks.   Specialty:  General Surgery   Contact information:   [address] [place] [address] [phone]       Follow up with [facility].   Why:  home health nurse    Contact information:   [address] [place] [address] [phone]       Signed: Rosario Adie 10/05/4626, 6:38 PM

## 2014-02-10 NOTE — Discharge Instructions (Signed)
[facility], [place] [id number]  POST OP INSTRUCTIONS  Always review your discharge instruction sheet given to you by the facility where your surgery was performed. IF YOU HAVE DISABILITY OR FAMILY LEAVE FORMS, YOU MUST BRING THEM TO THE OFFICE FOR PROCESSING.   DO NOT GIVE THEM TO YOUR DOCTOR. A prescription for pain medication may be given to you upon discharge.  Take your pain medication as prescribed, if needed.  If narcotic pain medicine is not needed, then you may take acetaminophen (Tylenol) or ibuprofen (Advil) as needed. 1. Take your usually prescribed medications unless otherwise directed. 2. If you need a refill on your pain medication, please contact your pharmacy.  They will contact our office to request authorization.  Prescriptions will not be filled after 5pm or on week-ends. 3. You should follow a light diet the first few days after arrival home, such as soup and crackers, etc.  Resume your normal diet the day after surgery. 4. Most patients will experience some swelling and bruising on the chest and underarm.  Ice packs will help.  Swelling and bruising can take several days to resolve.  5. It is common to experience some constipation if taking pain medication after surgery.  Increasing fluid intake and taking a stool softener (such as Colace) will usually help or prevent this problem from occurring.  A mild laxative (Milk of Magnesia or Miralax) should be taken according to package instructions if there are no bowel movements after 48 hours. 6. Pack wound with moistened gauze and cover with a dry dressing.  Change daily.  7. ACTIVITIES:  You may resume regular (light) daily activities beginning the next day--such as daily self-care, walking, climbing stairs--gradually increasing activities as tolerated.  You may have sexual intercourse when it is comfortable.  Refrain from any heavy lifting or straining until approved by your doctor. a. You may drive when you are no  longer taking prescription pain medication, you can comfortably wear a seatbelt, and you can safely maneuver your car and apply brakes. b. RETURN TO WORK:  __________________________________________________________ 8. You should see your doctor in the office for a follow-up appointment approximately 3-5 days after your surgery.  Your doctors nurse will typically make your follow-up appointment when she calls you with your pathology report.  Expect your pathology report 2-3 business days after your surgery.  You may call to check if you do not hear from [place] after three days.   9. OTHER INSTRUCTIONS: ______________________________________________________________________________________________ ____________________________________________________________________________________________ WHEN TO CALL YOUR DOCTOR: 1. Fever over 101.0 2. Nausea and/or vomiting 3. Extreme swelling or bruising 4. Continued bleeding from incision. 5. Increased pain, redness, or drainage from the incision. The clinic staff is available to answer your questions during regular business hours.  Please dont hesitate to call and ask to speak to one of the nurses for clinical concerns.  If you have a medical emergency, go to the nearest emergency room or call 911.  A surgeon from [facility] is always on call at the hospital. [address] [place], [place], [place]  [address] ? [address] [place], [place], [place]   [address] [phone] ? [phone] ? FAX [phone]

## 2014-02-10 NOTE — Progress Notes (Signed)
Patient did her own dressing change with my observation. Did extremely well. Dressing materials given for discharge. Discharge instructions and prescriptions given with teachback. Discharged to friend's care via wheelchair with NT present. Melford Aase, RN

## 2014-02-10 NOTE — Care Management Note (Signed)
    Page 1 of 1   02/10/2014     4:29:29 PM CARE MANAGEMENT NOTE 02/10/2014  Patient:  Cassidy [name], Cassidy [name]   Account Number:  [id number]  Date Initiated:  02/10/2014  Documentation initiated by:  [facility]  Subjective/Objective Assessment:   adm:  Right breast abscess     Action/Plan:   discharge planning   Anticipated DC Date:  02/10/2014   Anticipated DC Plan:  [place]  CM consult      [facility] Choice  HOME HEALTH   Choice offered to / List presented to:  C-1 Patient        [place] arranged  HH-1 RN      [place].   Status of service:  Completed, signed off Medicare Important Message given?   (If response is "NO", the following Medicare IM given date fields will be blank) Date Medicare IM given:   Medicare IM given by:   Date Additional Medicare IM given:   Additional Medicare IM given by:    Discharge Disposition:  [place]  Per UR Regulation:    If discussed at Long Length of Stay Meetings, dates discussed:    Comments:  02/10/14 16:00 Cm received call from RN to please arrange [facility] thorugh [place].  CM notes no orders have been placed and have requested.  Orders placed post discharge and CM called referral to [facility] rep, Stephanie.  No other Cm needs were communicated.  Mariane Masters, BSN, [place].

## 2014-02-11 ENCOUNTER — Encounter (HOSPITAL_COMMUNITY): Admission: RE | Admit: 2014-02-11 | Payer: Medicaid Other | Source: Ambulatory Visit

## 2014-02-11 ENCOUNTER — Encounter (HOSPITAL_COMMUNITY): Payer: Self-pay | Admitting: Surgery

## 2014-02-11 LAB — HEMOGLOBIN A1C
Hgb A1c MFr Bld: 6.5 % — ABNORMAL HIGH (ref 4.8–5.6)
Mean Plasma Glucose: 140 mg/dL

## 2014-02-12 LAB — CULTURE, ROUTINE-ABSCESS

## 2014-02-13 ENCOUNTER — Telehealth (HOSPITAL_COMMUNITY): Payer: Self-pay | Admitting: *Deleted

## 2014-02-13 ENCOUNTER — Other Ambulatory Visit: Payer: Self-pay | Admitting: Internal Medicine

## 2014-02-13 ENCOUNTER — Encounter (HOSPITAL_COMMUNITY): Payer: Medicaid Other

## 2014-02-13 LAB — ANAEROBIC CULTURE

## 2014-02-13 NOTE — Telephone Encounter (Signed)
-----   Message from Donnie Mesa, MD sent at 02/12/2014  8:30 AM EST ----- Regarding: RE: When may pt return to cardiac rehab? One week until resuming exercise ----- Message -----    From: Rowe Pavy, RN    Sent: 02/11/2014   7:56 AM      To: Donnie Mesa, MD Subject: When may pt return to cardiac rehab?           Dr. Georgette Dover  Pt participates in cardiac rehab s/p stent placement in 10/2013.  Pt had nipple debridement on 1/29 discharged on 1/31.  Unsure if post op follow up scheduled.  When is it appropriate for pt to resume exercise?   Thanks for your input  [facility] RN

## 2014-02-15 ENCOUNTER — Encounter (HOSPITAL_COMMUNITY): Payer: Medicaid Other

## 2014-02-18 ENCOUNTER — Encounter (HOSPITAL_COMMUNITY): Payer: Medicaid Other

## 2014-02-20 ENCOUNTER — Encounter (HOSPITAL_COMMUNITY): Payer: Medicaid Other

## 2014-02-22 ENCOUNTER — Telehealth (HOSPITAL_COMMUNITY): Payer: Self-pay | Admitting: *Deleted

## 2014-02-22 ENCOUNTER — Encounter (HOSPITAL_COMMUNITY): Admission: RE | Admit: 2014-02-22 | Payer: Medicaid Other | Source: Ambulatory Visit

## 2014-02-22 NOTE — Telephone Encounter (Signed)
Called pt for well being check.  Pt with some infection to her surgery area and has not been able to attend rehab with the cold weather as well.  Pt hopes to return to rehab on next week when the area no longer needs packing. Cherre Huger, BSN

## 2014-02-25 ENCOUNTER — Encounter (HOSPITAL_COMMUNITY): Payer: Medicaid Other

## 2014-02-27 ENCOUNTER — Encounter (HOSPITAL_COMMUNITY)
Admission: RE | Admit: 2014-02-27 | Discharge: 2014-02-27 | Disposition: A | Discharge status: Home / Self Care | Payer: Medicaid Other | Source: Ambulatory Visit | Attending: Cardiology | Admitting: Cardiology

## 2014-02-27 DIAGNOSIS — F17210 Nicotine dependence, cigarettes, uncomplicated: Secondary | ICD-10-CM | POA: Diagnosis not present

## 2014-02-27 DIAGNOSIS — E785 Hyperlipidemia, unspecified: Secondary | ICD-10-CM | POA: Insufficient documentation

## 2014-02-27 DIAGNOSIS — M797 Fibromyalgia: Secondary | ICD-10-CM | POA: Insufficient documentation

## 2014-02-27 DIAGNOSIS — Z79899 Other long term (current) drug therapy: Secondary | ICD-10-CM | POA: Diagnosis not present

## 2014-02-27 DIAGNOSIS — K219 Gastro-esophageal reflux disease without esophagitis: Secondary | ICD-10-CM | POA: Insufficient documentation

## 2014-02-27 DIAGNOSIS — Z794 Long term (current) use of insulin: Secondary | ICD-10-CM | POA: Insufficient documentation

## 2014-02-27 DIAGNOSIS — F3130 Bipolar disorder, current episode depressed, mild or moderate severity, unspecified: Secondary | ICD-10-CM | POA: Diagnosis not present

## 2014-02-27 DIAGNOSIS — Z8249 Family history of ischemic heart disease and other diseases of the circulatory system: Secondary | ICD-10-CM | POA: Diagnosis not present

## 2014-02-27 DIAGNOSIS — F4310 Post-traumatic stress disorder, unspecified: Secondary | ICD-10-CM | POA: Insufficient documentation

## 2014-02-27 DIAGNOSIS — I25110 Atherosclerotic heart disease of native coronary artery with unstable angina pectoris: Secondary | ICD-10-CM | POA: Diagnosis not present

## 2014-02-27 DIAGNOSIS — Z5189 Encounter for other specified aftercare: Secondary | ICD-10-CM | POA: Diagnosis not present

## 2014-02-27 DIAGNOSIS — Z955 Presence of coronary angioplasty implant and graft: Secondary | ICD-10-CM | POA: Diagnosis not present

## 2014-02-27 DIAGNOSIS — I10 Essential (primary) hypertension: Secondary | ICD-10-CM | POA: Diagnosis not present

## 2014-02-27 DIAGNOSIS — E1142 Type 2 diabetes mellitus with diabetic polyneuropathy: Secondary | ICD-10-CM | POA: Diagnosis not present

## 2014-02-27 LAB — GLUCOSE, CAPILLARY: Glucose-Capillary: 90 mg/dL (ref 70–99)

## 2014-03-01 ENCOUNTER — Encounter (HOSPITAL_COMMUNITY): Payer: Medicaid Other

## 2014-03-01 ENCOUNTER — Telehealth (HOSPITAL_COMMUNITY): Payer: Self-pay | Admitting: Nurse Practitioner

## 2014-03-04 ENCOUNTER — Telehealth: Payer: Self-pay | Admitting: Cardiovascular Disease

## 2014-03-04 ENCOUNTER — Encounter (HOSPITAL_COMMUNITY)
Admission: RE | Admit: 2014-03-04 | Discharge: 2014-03-04 | Disposition: A | Discharge status: Home / Self Care | Payer: Medicaid Other | Source: Ambulatory Visit | Attending: Cardiology | Admitting: Cardiology

## 2014-03-04 DIAGNOSIS — Z5189 Encounter for other specified aftercare: Secondary | ICD-10-CM | POA: Diagnosis not present

## 2014-03-04 LAB — GLUCOSE, CAPILLARY
GLUCOSE-CAPILLARY: 146 mg/dL — AB (ref 70–99)
Glucose-Capillary: 94 mg/dL (ref 70–99)

## 2014-03-04 NOTE — Telephone Encounter (Signed)
Spoke with [facility] nurse - who sees patient r/t abscess care. She states patient reported to her that today during cardiac rehab after meditation when the lights were turned back on she felt like the room was spinning and she got dizzy. Her CBG was checked and was 92 and she felt nauseous. She was given gatorade and banana and recuperated enough to safely go home.   Beth saw patient later after rehab. Patient's CBG was 170 and patient reported to [facility] that she felt "spacy". Beth reports BPs have been low-mid 031R systolic the entirety of her care for patient. She says patient has not had any CP or SOB.   Called patient. She repeated story same as Beth regarding cardiac rehab incident. She states she has NO CP/SOB/edema. She has no issues of falling or pre-scynopal episodes. She states she feels like she is "in a cloud". She states her vision is blurry. She states these symptoms have only been occuring today. Patient reports her BP is usually around 110-120s and she has been on the same cardiac/BP medications for a while.   Informed patient I will send the message to Dr. Ellyn Hack to review symptoms and advise.

## 2014-03-04 NOTE — Progress Notes (Signed)
At the end of exercise today. Pt complained of feeling dizzy and nauseated.  bp checked 85/50.  Pt given gatorade.  Blood glucose checked 94. Pt also given a banana to eat.  Pt reported she ate a 1/2 sub sandwich before coming to exercise today.  Pt felt better and orthostatics were checked 96/58 sitting and 97/58 standing with no return of symptoms.   Pt felt better and okay for discharged.  Reminded pt to eat a heavier meal prior to exercise on Wednesday.  Pt verbalized that she would. Cherre Huger, BSN

## 2014-03-05 NOTE — Telephone Encounter (Signed)
Have her cut her lisinopril dose in half  Leonie Man, M.D., M.S. Interventional Cardiologist   Pager # [phone]

## 2014-03-06 ENCOUNTER — Encounter (HOSPITAL_COMMUNITY)
Admission: RE | Admit: 2014-03-06 | Discharge: 2014-03-06 | Disposition: A | Discharge status: Home / Self Care | Payer: Medicaid Other | Source: Ambulatory Visit | Attending: Cardiology | Admitting: Cardiology

## 2014-03-06 DIAGNOSIS — Z5189 Encounter for other specified aftercare: Secondary | ICD-10-CM | POA: Diagnosis not present

## 2014-03-06 NOTE — Telephone Encounter (Signed)
Patient notified to decrease lisinopril dose from 10mg  to 5mg . She voiced understanding and will contact office should she notice changes in her BP/symptoms Med list updated to reflect change

## 2014-03-06 NOTE — Progress Notes (Signed)
Pt returned to cardiac rehab today and reported that she was instructed to cut her lisinopril in half to 5 mg daily. Pt [facility] for [facility] change.  Per pt the nurse checked her bp and it was low.  Home health nurse called the office and reported her finding to the Md nurse.  Bp monitored throughout exercise today.  Bp remained wnl with no symptoms. Of note pt did eat heavier breakfast today prior to exercise. Cherre Huger, BSN

## 2014-03-08 ENCOUNTER — Encounter (HOSPITAL_COMMUNITY): Payer: Medicaid Other

## 2014-03-11 ENCOUNTER — Encounter (HOSPITAL_COMMUNITY): Payer: Medicaid Other

## 2014-03-12 ENCOUNTER — Telehealth: Payer: Self-pay | Admitting: Cardiology

## 2014-03-12 NOTE — Telephone Encounter (Signed)
Beth called in wanting to report that the pt's BP is still running low, she took it this morning and it was 98/52 without her meds. Beth also stated that she is complaining of some cramping in her thighs. The pt's home health orders will expire today and wanted to know if the doctor would recommend some additional orders to watch the pt's BP levels. Please f/u with [facility]

## 2014-03-12 NOTE — Telephone Encounter (Signed)
Called and left msg for [facility].

## 2014-03-12 NOTE — Telephone Encounter (Signed)
BP read 98/52 - this AM - prior to meds. Her lisinopril was cut last week r/t dizziness & low BP @ Cardiac Rehab last week.  Dizziness has resolved.  HR unreported. CBGs stable - betw. 110-140. HHRN reports patient drinking adequate fluids.  Pt also reported to [facility] leg cramps & abd pain this AM but states she is having heavy menses due to menopause onset.  HHRN was concerned for possible anemia.  Advised HHRN that we can follow up directly w/ patient. Will defer to Dr. Ellyn Hack for advice.

## 2014-03-13 ENCOUNTER — Encounter (HOSPITAL_COMMUNITY): Payer: Medicaid Other

## 2014-03-14 NOTE — Progress Notes (Addendum)
Pt will graduate from the cardiac rehab phase II program with the completion of 14  Exercise sessions during a 12 week time period allocated by Medicaid on 03/15/14. Interview conducted via phone with pt's permission.  Pt with fair attendance due to hospitalization and treatment of breast infection and issues with transportation.  Pt uses the bus and due to the winter time of the year weather prevented her from walking to the bus stop area.  Pt attended education classes and demonstrates a good working knowledge of heart healthy living.  Pt made improvements with her exercise as evident by her increased MET level from 2.9 to 3.6.  Pt faithful to home exercise with walking every day particularly since she does not own a car she would often walk to run errands.  Medication list reconciled.  Pt verbalizes compliance to her medications and denies any barriers toward medication.  Pt with recent med change to decrease Lisinopril from 10 to 5 mg due to orthostatic bp changes along with symptoms of feeling dizzy.  Pt reports that this has helped some but she also feels tired and no energy.  Pt suspect she may be experiencing pre menopausal symptoms and was advised to make appt with her gyn MD. Pt reports that she has met her short term goal of the program to strengthen her heart and to be consistent with workouts. Pt feels good about her exercise program and is happy that she is sticking with it this time.  Pt has a partial met for her long term goal to quit smoking.  Pt attended 2 tobacco cessation classes and was unable to finish due to the hospitalization for breast infection. Pt has the schedule and plans to restart the classes on next week.  Pt started with one pack of cigarettes a day and worked her way down to 8 to 6 and presently is at 4.  Pt congratulated on cutting down and encourage her to continue on this same pattern. Pt feels she has the tools needed in order to be successful.  Psychological Assessment Repeat  Phq2 score decrease to a 2 from a 5. Pt admits she is in a much better and improved head space.  When she starts to have depressive thoughts she quickly will change and focus her energy on something positive and that distracts her from the present mood.  Pt has open conversation with her teen age son so that the household is clear and no one has hurt feelings about anything.  She states that this is an improvement verses holding everything in.  Pt plans to continue her home exercise with walking.  It was a delight to have pt participate in the cardiac rehab program. Carlette Carlton RN, BSN 

## 2014-03-15 ENCOUNTER — Encounter (HOSPITAL_COMMUNITY)
Admission: RE | Admit: 2014-03-15 | Discharge: 2014-03-15 | Disposition: A | Discharge status: Home / Self Care | Payer: Medicaid Other | Source: Ambulatory Visit | Attending: Cardiology | Admitting: Cardiology

## 2014-03-15 DIAGNOSIS — Z79899 Other long term (current) drug therapy: Secondary | ICD-10-CM | POA: Diagnosis not present

## 2014-03-15 DIAGNOSIS — F3130 Bipolar disorder, current episode depressed, mild or moderate severity, unspecified: Secondary | ICD-10-CM | POA: Insufficient documentation

## 2014-03-15 DIAGNOSIS — Z5189 Encounter for other specified aftercare: Secondary | ICD-10-CM | POA: Diagnosis not present

## 2014-03-15 DIAGNOSIS — F4310 Post-traumatic stress disorder, unspecified: Secondary | ICD-10-CM | POA: Insufficient documentation

## 2014-03-15 DIAGNOSIS — K219 Gastro-esophageal reflux disease without esophagitis: Secondary | ICD-10-CM | POA: Diagnosis not present

## 2014-03-15 DIAGNOSIS — Z955 Presence of coronary angioplasty implant and graft: Secondary | ICD-10-CM | POA: Diagnosis not present

## 2014-03-15 DIAGNOSIS — E1142 Type 2 diabetes mellitus with diabetic polyneuropathy: Secondary | ICD-10-CM | POA: Diagnosis not present

## 2014-03-15 DIAGNOSIS — M797 Fibromyalgia: Secondary | ICD-10-CM | POA: Insufficient documentation

## 2014-03-15 DIAGNOSIS — Z8249 Family history of ischemic heart disease and other diseases of the circulatory system: Secondary | ICD-10-CM | POA: Diagnosis not present

## 2014-03-15 DIAGNOSIS — Z794 Long term (current) use of insulin: Secondary | ICD-10-CM | POA: Diagnosis not present

## 2014-03-15 DIAGNOSIS — I10 Essential (primary) hypertension: Secondary | ICD-10-CM | POA: Diagnosis not present

## 2014-03-15 DIAGNOSIS — E785 Hyperlipidemia, unspecified: Secondary | ICD-10-CM | POA: Diagnosis not present

## 2014-03-15 DIAGNOSIS — F17210 Nicotine dependence, cigarettes, uncomplicated: Secondary | ICD-10-CM | POA: Diagnosis not present

## 2014-03-15 DIAGNOSIS — I25110 Atherosclerotic heart disease of native coronary artery with unstable angina pectoris: Secondary | ICD-10-CM | POA: Diagnosis not present

## 2014-03-15 NOTE — Progress Notes (Signed)
Cassidy [name] 49 y.o. female Nutrition Note Spoke with pt. Pt well-known to this Probation officer from previous admission. Nutrition Plan and Nutrition Survey goals reviewed with pt. Pt is following Step 1of the Therapeutic Lifestyle Changes diet. Areas patient can make healthier food choices discussed. Pt is diabetic. Last A1c indicates blood glucose well-controlled. Pt states her current insulin regimen is working well for her. This Probation officer went over Diabetes Education test results. Pt expressed understanding of the information reviewed. Pt checks fasting CBG's daily. Per pt, fasting CBG's WNL. Pt aware of nutrition education classes offered and has attended nutrition classes during previous admission. Lab Results  Component Value Date   HGBA1C 6.5* 02/08/2014   Nutrition Diagnosis ? Food-and nutrition-related knowledge deficit related to lack of exposure to information as related to diagnosis of: ? CVD ? DM  ? Obesity related to excessive energy intake as evidenced by a BMI of 39.4  Nutrition RX/ Estimated Daily Nutrition Needs for: wt loss  1550-2050 Kcal, 40-55 gm fat, 10-16 gm sat fat, 1.5-2.0 gm trans-fat, <1500 mg sodium, 175-250 gm CHO   Nutrition Intervention ? Pt's individual nutrition plan reviewed with pt. ? Benefits of adopting Therapeutic Lifestyle Changes discussed when Medficts reviewed. ? Pt to attend the Portion Distortion class ? Pt to attend the Diabetes Q & A class ? Continue client-centered nutrition education by RD, as part of interdisciplinary care. Goal(s) ? Pt to identify and limit food sources of saturated fat, trans fat, and cholesterol ? Pt to identify food quantities necessary to achieve: ? wt loss to a goal wt of 229-247 lb (104.1-112.3 kg) at graduation from cardiac rehab.  Monitor and Evaluate progress toward nutrition goal with team. Nutrition Risk: Change to Moderate Derek Mound, M.Ed, RD, LDN, CDE 03/15/2014 12:30 PM

## 2014-03-21 NOTE — Telephone Encounter (Signed)
Can this encounter be closed?

## 2014-04-05 ENCOUNTER — Other Ambulatory Visit: Payer: Self-pay | Admitting: Internal Medicine

## 2014-04-15 ENCOUNTER — Telehealth: Payer: Self-pay | Admitting: Internal Medicine

## 2014-04-15 NOTE — Telephone Encounter (Signed)
Please read message below and advise in Dr Arman Filter absence.

## 2014-04-15 NOTE — Telephone Encounter (Signed)
Diflucan 150 mg, 1 tablet

## 2014-04-15 NOTE — Telephone Encounter (Signed)
Pt called said that Dr. Cruzita Lederer has her on inzokana and it has made her have a yeast infection and wants to know if something can be called in for yeast infection

## 2014-04-16 MED ORDER — FLUCONAZOLE 150 MG PO TABS: 150.0000 mg | ORAL_TABLET | Freq: Once | ORAL | Status: DC

## 2014-04-16 NOTE — Addendum Note (Signed)
Addended by: Rockie Neighbours B on: 04/16/2014 08:14 AM   Modules accepted: Orders

## 2014-04-16 NOTE — Telephone Encounter (Signed)
Done

## 2014-04-22 ENCOUNTER — Ambulatory Visit: Payer: Medicaid Other | Admitting: Cardiology

## 2014-05-07 ENCOUNTER — Other Ambulatory Visit: Payer: Self-pay | Admitting: Internal Medicine

## 2014-06-03 ENCOUNTER — Other Ambulatory Visit: Payer: Self-pay | Admitting: Obstetrics

## 2014-06-09 ENCOUNTER — Other Ambulatory Visit: Payer: Self-pay | Admitting: Internal Medicine

## 2014-06-12 ENCOUNTER — Other Ambulatory Visit: Payer: Self-pay | Admitting: *Deleted

## 2014-06-12 MED ORDER — CANAGLIFLOZIN 100 MG PO TABS: ORAL_TABLET | ORAL | Status: DC

## 2014-06-14 ENCOUNTER — Inpatient Hospital Stay (HOSPITAL_COMMUNITY): Payer: Medicaid Other | Admitting: Certified Registered Nurse Anesthetist

## 2014-06-14 ENCOUNTER — Ambulatory Visit (HOSPITAL_COMMUNITY)
Admission: AD | Admit: 2014-06-14 | Discharge: 2014-06-15 | Disposition: A | Discharge status: Home / Self Care | Payer: Medicaid Other | Source: Other Acute Inpatient Hospital | Attending: Surgery | Admitting: Surgery

## 2014-06-14 ENCOUNTER — Encounter (HOSPITAL_COMMUNITY): Admission: AD | Disposition: A | Discharge status: Home / Self Care | Payer: Self-pay | Attending: Surgery

## 2014-06-14 ENCOUNTER — Encounter (HOSPITAL_COMMUNITY): Payer: Self-pay | Admitting: *Deleted

## 2014-06-14 ENCOUNTER — Ambulatory Visit: Payer: Self-pay | Admitting: Surgery

## 2014-06-14 DIAGNOSIS — Z955 Presence of coronary angioplasty implant and graft: Secondary | ICD-10-CM | POA: Diagnosis not present

## 2014-06-14 DIAGNOSIS — I2510 Atherosclerotic heart disease of native coronary artery without angina pectoris: Secondary | ICD-10-CM | POA: Diagnosis not present

## 2014-06-14 DIAGNOSIS — F17210 Nicotine dependence, cigarettes, uncomplicated: Secondary | ICD-10-CM | POA: Insufficient documentation

## 2014-06-14 DIAGNOSIS — Z794 Long term (current) use of insulin: Secondary | ICD-10-CM | POA: Diagnosis not present

## 2014-06-14 DIAGNOSIS — I10 Essential (primary) hypertension: Secondary | ICD-10-CM | POA: Diagnosis not present

## 2014-06-14 DIAGNOSIS — F319 Bipolar disorder, unspecified: Secondary | ICD-10-CM | POA: Insufficient documentation

## 2014-06-14 DIAGNOSIS — J45909 Unspecified asthma, uncomplicated: Secondary | ICD-10-CM | POA: Insufficient documentation

## 2014-06-14 DIAGNOSIS — K219 Gastro-esophageal reflux disease without esophagitis: Secondary | ICD-10-CM | POA: Diagnosis not present

## 2014-06-14 DIAGNOSIS — Z7982 Long term (current) use of aspirin: Secondary | ICD-10-CM | POA: Insufficient documentation

## 2014-06-14 DIAGNOSIS — Z7902 Long term (current) use of antithrombotics/antiplatelets: Secondary | ICD-10-CM | POA: Diagnosis not present

## 2014-06-14 DIAGNOSIS — N61 Inflammatory disorders of breast: Principal | ICD-10-CM | POA: Insufficient documentation

## 2014-06-14 DIAGNOSIS — N611 Abscess of the breast and nipple: Secondary | ICD-10-CM | POA: Diagnosis present

## 2014-06-14 DIAGNOSIS — E119 Type 2 diabetes mellitus without complications: Secondary | ICD-10-CM | POA: Diagnosis not present

## 2014-06-14 HISTORY — PX: INCISION AND DRAINAGE ABSCESS: SHX5864

## 2014-06-14 LAB — POCT I-STAT, CHEM 8
BUN: 7 mg/dL (ref 6–20)
CALCIUM ION: 1.05 mmol/L — AB (ref 1.12–1.23)
Chloride: 104 mmol/L (ref 101–111)
Creatinine, Ser: 0.6 mg/dL (ref 0.44–1.00)
Glucose, Bld: 104 mg/dL — ABNORMAL HIGH (ref 65–99)
HCT: 42 % (ref 36.0–46.0)
HEMOGLOBIN: 14.3 g/dL (ref 12.0–15.0)
POTASSIUM: 3.5 mmol/L (ref 3.5–5.1)
Sodium: 138 mmol/L (ref 135–145)
TCO2: 20 mmol/L (ref 0–100)

## 2014-06-14 LAB — GLUCOSE, CAPILLARY
GLUCOSE-CAPILLARY: 100 mg/dL — AB (ref 65–99)
GLUCOSE-CAPILLARY: 120 mg/dL — AB (ref 65–99)
Glucose-Capillary: 246 mg/dL — ABNORMAL HIGH (ref 65–99)

## 2014-06-14 LAB — HCG, SERUM, QUALITATIVE: Preg, Serum: NEGATIVE

## 2014-06-14 SURGERY — INCISION AND DRAINAGE, ABSCESS
Anesthesia: General | Site: Breast | Laterality: Right

## 2014-06-14 MED ORDER — CANAGLIFLOZIN 100 MG PO TABS
100.0000 mg | ORAL_TABLET | Freq: Every day | ORAL | Status: DC
  Administered 2014-06-15: 100 mg via ORAL
  Filled 2014-06-14 (×2): qty 1

## 2014-06-14 MED ORDER — ASPIRIN EC 81 MG PO TBEC
81.0000 mg | DELAYED_RELEASE_TABLET | Freq: Every day | ORAL | Status: DC
  Administered 2014-06-15: 81 mg via ORAL
  Filled 2014-06-14: qty 1

## 2014-06-14 MED ORDER — 0.9 % SODIUM CHLORIDE (POUR BTL) OPTIME
TOPICAL | Status: DC | PRN
  Administered 2014-06-14: 1000 mL

## 2014-06-14 MED ORDER — FENTANYL CITRATE (PF) 250 MCG/5ML IJ SOLN
INTRAMUSCULAR | Status: AC
  Filled 2014-06-14: qty 5

## 2014-06-14 MED ORDER — OXYCODONE HCL 5 MG PO TABS
10.0000 mg | ORAL_TABLET | Freq: Four times a day (QID) | ORAL | Status: DC | PRN
  Administered 2014-06-14 – 2014-06-15 (×2): 10 mg via ORAL
  Filled 2014-06-14 (×2): qty 2

## 2014-06-14 MED ORDER — GABAPENTIN 300 MG PO CAPS
600.0000 mg | ORAL_CAPSULE | Freq: Two times a day (BID) | ORAL | Status: DC
  Administered 2014-06-14 – 2014-06-15 (×2): 600 mg via ORAL
  Filled 2014-06-14 (×2): qty 2

## 2014-06-14 MED ORDER — CIPROFLOXACIN IN D5W 400 MG/200ML IV SOLN
400.0000 mg | Freq: Two times a day (BID) | INTRAVENOUS | Status: DC
  Administered 2014-06-15: 400 mg via INTRAVENOUS
  Filled 2014-06-14 (×2): qty 200

## 2014-06-14 MED ORDER — DIPHENHYDRAMINE HCL 50 MG/ML IJ SOLN
INTRAMUSCULAR | Status: AC
  Filled 2014-06-14: qty 1

## 2014-06-14 MED ORDER — CARVEDILOL 3.125 MG PO TABS
ORAL_TABLET | ORAL | Status: AC
  Administered 2014-06-14: 6.25 mg via ORAL
  Filled 2014-06-14: qty 2

## 2014-06-14 MED ORDER — LISINOPRIL 5 MG PO TABS
5.0000 mg | ORAL_TABLET | Freq: Every day | ORAL | Status: DC
  Administered 2014-06-15: 5 mg via ORAL
  Filled 2014-06-14: qty 1

## 2014-06-14 MED ORDER — HYDROMORPHONE HCL 1 MG/ML IJ SOLN
1.0000 mg | INTRAMUSCULAR | Status: DC | PRN
Start: 2014-06-14 — End: 2014-06-15

## 2014-06-14 MED ORDER — PROPOFOL 10 MG/ML IV BOLUS
INTRAVENOUS | Status: AC
  Filled 2014-06-14: qty 20

## 2014-06-14 MED ORDER — SIMVASTATIN 40 MG PO TABS
40.0000 mg | ORAL_TABLET | Freq: Every day | ORAL | Status: DC
  Administered 2014-06-14: 40 mg via ORAL
  Filled 2014-06-14: qty 1

## 2014-06-14 MED ORDER — ISOSORBIDE MONONITRATE ER 30 MG PO TB24
15.0000 mg | ORAL_TABLET | Freq: Every day | ORAL | Status: DC
  Administered 2014-06-15: 15 mg via ORAL
  Filled 2014-06-14: qty 1

## 2014-06-14 MED ORDER — HYDROMORPHONE HCL 1 MG/ML IJ SOLN
INTRAMUSCULAR | Status: AC
  Filled 2014-06-14: qty 1

## 2014-06-14 MED ORDER — ONDANSETRON HCL 4 MG PO TABS: 4.0000 mg | ORAL_TABLET | Freq: Four times a day (QID) | ORAL | Status: DC | PRN

## 2014-06-14 MED ORDER — VANCOMYCIN HCL 10 G IV SOLR
1500.0000 mg | INTRAVENOUS | Status: DC
  Filled 2014-06-14: qty 1500

## 2014-06-14 MED ORDER — FLUTICASONE PROPIONATE 50 MCG/ACT NA SUSP
1.0000 | Freq: Every day | NASAL | Status: DC | PRN
  Filled 2014-06-14: qty 16

## 2014-06-14 MED ORDER — BUPIVACAINE-EPINEPHRINE (PF) 0.25% -1:200000 IJ SOLN
INTRAMUSCULAR | Status: AC
  Filled 2014-06-14: qty 30

## 2014-06-14 MED ORDER — BUPIVACAINE-EPINEPHRINE 0.25% -1:200000 IJ SOLN
INTRAMUSCULAR | Status: DC | PRN
  Administered 2014-06-14: 10 mL

## 2014-06-14 MED ORDER — TRAZODONE HCL 50 MG PO TABS
50.0000 mg | ORAL_TABLET | Freq: Every evening | ORAL | Status: DC | PRN
  Administered 2014-06-14: 50 mg via ORAL
  Filled 2014-06-14: qty 1

## 2014-06-14 MED ORDER — LIDOCAINE HCL (CARDIAC) 20 MG/ML IV SOLN
INTRAVENOUS | Status: DC | PRN
  Administered 2014-06-14: 40 mg via INTRAVENOUS

## 2014-06-14 MED ORDER — DEXAMETHASONE SODIUM PHOSPHATE 4 MG/ML IJ SOLN
INTRAMUSCULAR | Status: DC | PRN
  Administered 2014-06-14: 4 mg via INTRAVENOUS

## 2014-06-14 MED ORDER — DIPHENHYDRAMINE HCL 50 MG/ML IJ SOLN
INTRAMUSCULAR | Status: DC | PRN
  Administered 2014-06-14: 10 mg via INTRAVENOUS

## 2014-06-14 MED ORDER — INSULIN DETEMIR 100 UNIT/ML ~~LOC~~ SOLN
35.0000 [IU] | Freq: Every day | SUBCUTANEOUS | Status: DC
  Administered 2014-06-14: 35 [IU] via SUBCUTANEOUS
  Filled 2014-06-14 (×2): qty 0.35

## 2014-06-14 MED ORDER — FENTANYL CITRATE (PF) 100 MCG/2ML IJ SOLN
INTRAMUSCULAR | Status: DC | PRN
  Administered 2014-06-14 (×2): 50 ug via INTRAVENOUS
  Administered 2014-06-14: 100 ug via INTRAVENOUS

## 2014-06-14 MED ORDER — MIDAZOLAM HCL 2 MG/2ML IJ SOLN
INTRAMUSCULAR | Status: AC
  Filled 2014-06-14: qty 2

## 2014-06-14 MED ORDER — CHLORHEXIDINE GLUCONATE 4 % EX LIQD: 1.0000 "application " | Freq: Once | CUTANEOUS | Status: DC

## 2014-06-14 MED ORDER — ENOXAPARIN SODIUM 40 MG/0.4ML ~~LOC~~ SOLN
40.0000 mg | SUBCUTANEOUS | Status: DC
  Administered 2014-06-15: 40 mg via SUBCUTANEOUS
  Filled 2014-06-14: qty 0.4

## 2014-06-14 MED ORDER — DEXAMETHASONE SODIUM PHOSPHATE 4 MG/ML IJ SOLN
INTRAMUSCULAR | Status: AC
  Filled 2014-06-14: qty 1

## 2014-06-14 MED ORDER — CARVEDILOL 6.25 MG PO TABS
6.2500 mg | ORAL_TABLET | Freq: Two times a day (BID) | ORAL | Status: DC
  Administered 2014-06-15: 6.25 mg via ORAL
  Filled 2014-06-14: qty 1

## 2014-06-14 MED ORDER — NITROGLYCERIN 0.4 MG SL SUBL: 0.4000 mg | SUBLINGUAL_TABLET | SUBLINGUAL | Status: DC | PRN

## 2014-06-14 MED ORDER — MEPERIDINE HCL 25 MG/ML IJ SOLN: 6.2500 mg | INTRAMUSCULAR | Status: DC | PRN

## 2014-06-14 MED ORDER — CIPROFLOXACIN IN D5W 400 MG/200ML IV SOLN
400.0000 mg | INTRAVENOUS | Status: AC
  Administered 2014-06-14: 400 mg via INTRAVENOUS
  Filled 2014-06-14: qty 200

## 2014-06-14 MED ORDER — CLOPIDOGREL BISULFATE 75 MG PO TABS
75.0000 mg | ORAL_TABLET | Freq: Every day | ORAL | Status: DC
  Administered 2014-06-15: 75 mg via ORAL
  Filled 2014-06-14: qty 1

## 2014-06-14 MED ORDER — MIDAZOLAM HCL 5 MG/5ML IJ SOLN
INTRAMUSCULAR | Status: DC | PRN
  Administered 2014-06-14: 2 mg via INTRAVENOUS

## 2014-06-14 MED ORDER — FLUCONAZOLE 100 MG PO TABS
150.0000 mg | ORAL_TABLET | Freq: Once | ORAL | Status: AC
  Administered 2014-06-14: 150 mg via ORAL
  Filled 2014-06-14: qty 2

## 2014-06-14 MED ORDER — ACYCLOVIR 400 MG PO TABS
400.0000 mg | ORAL_TABLET | Freq: Every day | ORAL | Status: DC
  Administered 2014-06-15: 400 mg via ORAL
  Filled 2014-06-14: qty 1

## 2014-06-14 MED ORDER — SODIUM CHLORIDE 0.9 % IV SOLN
INTRAVENOUS | Status: DC
  Administered 2014-06-14: 19:00:00 via INTRAVENOUS

## 2014-06-14 MED ORDER — ISOSORBIDE MONONITRATE 15 MG HALF TABLET
15.0000 mg | ORAL_TABLET | Freq: Once | ORAL | Status: AC
  Administered 2014-06-14: 15 mg via ORAL
  Filled 2014-06-14: qty 1

## 2014-06-14 MED ORDER — CARVEDILOL 6.25 MG PO TABS
6.2500 mg | ORAL_TABLET | Freq: Once | ORAL | Status: AC
  Administered 2014-06-14: 6.25 mg via ORAL
  Filled 2014-06-14: qty 1

## 2014-06-14 MED ORDER — HYDROMORPHONE HCL 1 MG/ML IJ SOLN
0.2500 mg | INTRAMUSCULAR | Status: DC | PRN
Start: 2014-06-14 — End: 2014-06-14
  Administered 2014-06-14 (×4): 0.5 mg via INTRAVENOUS

## 2014-06-14 MED ORDER — INSULIN ASPART 100 UNIT/ML ~~LOC~~ SOLN
4.0000 [IU] | Freq: Three times a day (TID) | SUBCUTANEOUS | Status: DC
  Administered 2014-06-15: 4 [IU] via SUBCUTANEOUS

## 2014-06-14 MED ORDER — ONDANSETRON HCL 4 MG/2ML IJ SOLN: 4.0000 mg | Freq: Four times a day (QID) | INTRAMUSCULAR | Status: DC | PRN

## 2014-06-14 MED ORDER — ONDANSETRON HCL 4 MG/2ML IJ SOLN
INTRAMUSCULAR | Status: AC
  Filled 2014-06-14: qty 2

## 2014-06-14 MED ORDER — INSULIN ASPART 100 UNIT/ML ~~LOC~~ SOLN
0.0000 [IU] | Freq: Three times a day (TID) | SUBCUTANEOUS | Status: DC
  Administered 2014-06-15: 3 [IU] via SUBCUTANEOUS

## 2014-06-14 MED ORDER — PROMETHAZINE HCL 25 MG/ML IJ SOLN: 6.2500 mg | INTRAMUSCULAR | Status: DC | PRN

## 2014-06-14 MED ORDER — PROPOFOL 10 MG/ML IV BOLUS
INTRAVENOUS | Status: DC | PRN
  Administered 2014-06-14: 140 mg via INTRAVENOUS

## 2014-06-14 MED ORDER — METFORMIN HCL ER 500 MG PO TB24
500.0000 mg | ORAL_TABLET | Freq: Every day | ORAL | Status: DC
  Administered 2014-06-15: 500 mg via ORAL
  Filled 2014-06-14: qty 1

## 2014-06-14 MED ORDER — ONDANSETRON HCL 4 MG/2ML IJ SOLN
INTRAMUSCULAR | Status: DC | PRN
  Administered 2014-06-14: 4 mg via INTRAVENOUS

## 2014-06-14 MED ORDER — LACTATED RINGERS IV SOLN
INTRAVENOUS | Status: DC
  Administered 2014-06-14 (×2): via INTRAVENOUS

## 2014-06-14 SURGICAL SUPPLY — 28 items
BNDG GAUZE ELAST 4 BULKY (GAUZE/BANDAGES/DRESSINGS) IMPLANT
CANISTER SUCTION 2500CC (MISCELLANEOUS) ×3 IMPLANT
COVER SURGICAL LIGHT HANDLE (MISCELLANEOUS) ×3 IMPLANT
DRAPE LAPAROSCOPIC ABDOMINAL (DRAPES) IMPLANT
DRAPE PED LAPAROTOMY (DRAPES) IMPLANT
DRSG PAD ABDOMINAL 8X10 ST (GAUZE/BANDAGES/DRESSINGS) ×4 IMPLANT
ELECT REM PT RETURN 9FT ADLT (ELECTROSURGICAL) ×3
ELECTRODE REM PT RTRN 9FT ADLT (ELECTROSURGICAL) ×1 IMPLANT
GAUZE PACKING IODOFORM 1/4X15 (GAUZE/BANDAGES/DRESSINGS) ×2 IMPLANT
GAUZE SPONGE 4X4 12PLY STRL (GAUZE/BANDAGES/DRESSINGS) IMPLANT
GLOVE BIO SURGEON STRL SZ 6.5 (GLOVE) ×2 IMPLANT
GLOVE BIO SURGEON STRL SZ7 (GLOVE) ×7 IMPLANT
GLOVE BIO SURGEONS STRL SZ 6.5 (GLOVE) ×2
GLOVE BIOGEL PI IND STRL 7.5 (GLOVE) ×1 IMPLANT
GLOVE BIOGEL PI INDICATOR 7.5 (GLOVE) ×2
GOWN STRL REUS W/ TWL LRG LVL3 (GOWN DISPOSABLE) ×2 IMPLANT
GOWN STRL REUS W/TWL LRG LVL3 (GOWN DISPOSABLE) ×6
KIT BASIN OR (CUSTOM PROCEDURE TRAY) ×3 IMPLANT
KIT ROOM TURNOVER OR (KITS) ×3 IMPLANT
NS IRRIG 1000ML POUR BTL (IV SOLUTION) ×3 IMPLANT
PACK GENERAL/GYN (CUSTOM PROCEDURE TRAY) ×3 IMPLANT
PAD ARMBOARD 7.5X6 YLW CONV (MISCELLANEOUS) ×3 IMPLANT
SPONGE GAUZE 4X4 12PLY STER LF (GAUZE/BANDAGES/DRESSINGS) ×2 IMPLANT
SWAB COLLECTION DEVICE MRSA (MISCELLANEOUS) ×2 IMPLANT
TAPE CLOTH SURG 4X10 WHT LF (GAUZE/BANDAGES/DRESSINGS) ×2 IMPLANT
TOWEL OR 17X24 6PK STRL BLUE (TOWEL DISPOSABLE) ×3 IMPLANT
TOWEL OR 17X26 10 PK STRL BLUE (TOWEL DISPOSABLE) ×3 IMPLANT
TUBE ANAEROBIC SPECIMEN COL (MISCELLANEOUS) ×2 IMPLANT

## 2014-06-14 NOTE — Progress Notes (Signed)
Arrived to room 6n28 from PACU, denies nausea/pain at this time, oriented to room and surroundings

## 2014-06-14 NOTE — Transfer of Care (Signed)
Immediate Anesthesia Transfer of Care Note  Patient: Cassidy [name]  Procedure(s) Performed: Procedure(s): INCISION AND DRAINAGE RIGHT BREAST ABSCESS (Right)  Patient Location: PACU  Anesthesia Type:General  Level of Consciousness: awake and oriented  Airway & Oxygen Therapy: Patient Spontanous Breathing and Patient connected to nasal cannula oxygen  Post-op Assessment: Report given to RN, Post -op Vital signs reviewed and stable and Patient moving all extremities  Post vital signs: Reviewed and stable  Last Vitals:  Filed Vitals:   06/14/14 1438  BP: 132/72  Pulse: 85  Temp: 36.7 C  Resp: 18    Complications: No apparent anesthesia complications

## 2014-06-14 NOTE — Anesthesia Preprocedure Evaluation (Addendum)
Anesthesia Evaluation  Patient identified by MRN, date of birth, ID band Patient awake    Reviewed: Allergy & Precautions, H&P , NPO status , Patient's Chart, lab work & pertinent test results, reviewed documented beta blocker date and time   History of Anesthesia Complications (+) history of anesthetic complications  Airway Mallampati: II  TM Distance: >3 FB Neck ROM: full    Dental   Pulmonary asthma , Current Smoker,  breath sounds clear to auscultation        Cardiovascular hypertension, On Medications + angina + CAD and + Cardiac Stents Rhythm:regular  LHC and PCI 11/08/2013 Integrity Resolute DES 3.0 mm x 22 mm placed in mid LAD     Neuro/Psych PSYCHIATRIC DISORDERS Depression Bipolar Disorder  Neuromuscular disease    GI/Hepatic Neg liver ROS, GERD-  Medicated and Controlled,  Endo/Other  diabetes, Oral Hypoglycemic Agents  Renal/GU negative Renal ROS  negative genitourinary   Musculoskeletal  (+) Fibromyalgia -  Abdominal   Peds  Hematology  (+) Sickle cell trait and anemia ,   Anesthesia Other Findings See surgeon's H&P   Reproductive/Obstetrics negative OB ROS                             Anesthesia Physical  Anesthesia Plan  ASA: IV and emergent  Anesthesia Plan: General   Post-op Pain Management:    Induction: Intravenous and Rapid sequence  Airway Management Planned: Oral ETT  Additional Equipment: None  Intra-op Plan:   Post-operative Plan: Extubation in OR  Informed Consent: I have reviewed the patients History and Physical, chart, labs and discussed the procedure including the risks, benefits and alternatives for the proposed anesthesia with the patient or authorized representative who has indicated his/her understanding and acceptance.   Dental Advisory Given  Plan Discussed with: CRNA  Anesthesia Plan Comments:        Anesthesia Quick Evaluation

## 2014-06-14 NOTE — Discharge Instructions (Signed)
Daily wet to dry dressings to right breast wound Cover with dry dressing Sponge baths only

## 2014-06-14 NOTE — Op Note (Signed)
Preop diagnosis: Recurrent right breast abscess Postop diagnosis: Same Procedure performed: Incision and drainage of recurrent right breast abscess Surgeon:Bular Hickok K. Anesthesia: Gen. via LMA Indications: This is a 49 year old female with diabetes who presents with recurrent right breast abscess. She has had at least 4 previous surgeries to drain abscesses in this area. Her most recent was in January of this year. She has developed a recurrent abscess just behind the areola and nipple of the right breast. This is exquisitely tender and erythematous.  Description of procedure: The patient brought to the operating room placed in supine position on the operating table. After an adequate level of general anesthesia was obtained her right breast was prepped with Betadine and draped sterile fashion. A timeout was taken to ensure the proper patient proper procedure. We infiltrated the area around the abscess with 0.25% Marcaine with epinephrine. Her nipple is quite swollen and tender. The previous scar above the areola is contracted. We open this incision and dissected down into a 1.5 cm diameter abscess cavity. Cultures were sent. The abscess cavity was completely excised. The abscess seems to extend up into the posterior surface of the upper nipple. I tried open this area and it seems like several of the mammary ducts are chronically infected. I excised the upper half of the nipple. It appeared to be healthy breast tissue in the size of the wound. We irrigated thoroughly. I released the scar circumferentially to release the contracture. The wound was packed with saline moistened gauze. Dry dressing was applied. The patient was then extubated and brought to recovery in stable condition. All sponge, instrument, and needle counts are correct.  Imogene Burn. Georgette Dover, MD, [facility]  General/ Trauma Surgery  06/14/2014 4:05 PM

## 2014-06-14 NOTE — Anesthesia Procedure Notes (Signed)
Procedure Name: LMA Insertion Date/Time: 06/14/2014 3:29 PM Performed by: Melina Copa, Neidra Girvan R Pre-anesthesia Checklist: Patient identified, Emergency Drugs available, Suction available, Patient being monitored and Timeout performed Patient Re-evaluated:Patient Re-evaluated prior to inductionOxygen Delivery Method: Circle system utilized Preoxygenation: Pre-oxygenation with 100% oxygen Intubation Type: IV induction Ventilation: Mask ventilation without difficulty LMA: LMA inserted LMA Size: 4.0 Number of attempts: 1 Placement Confirmation: positive ETCO2 Tube secured with: Tape Dental Injury: Teeth and Oropharynx as per pre-operative assessment

## 2014-06-14 NOTE — H&P (Signed)
  History of Present Illness Cassidy [name]. Cassidy Koegel MD; 06/14/2014 10:02 AM) Patient words: breast abscess.  The patient is a 49 year old female who presents with a breast abscess. 49 year old with recurrent right breast abscess. No improvement on Cipro. Exquisitely tender under her nipple. There has been some purulent drainage coming through her nipple over the last couple of days. The skin around this area has become reddened. She has been nothing by mouth for the last 2 hours. Allergies Cassidy [name], CMA; 06/14/2014 9:40 AM) Amoxicillin *PENICILLINS*  Medication History Cassidy [name], CMA; 06/14/2014 9:40 AM) Medications Reconciled Cipro ('500MG'$  Tablet, 1 (one) Tablet Oral two times daily, Taken starting 06/07/2014) Active. Lyrica ('75MG'$  Capsule, Oral) Active. Accu-Chek Aviva Plus (In Vitro) Active. Fluticasone Propionate (50MCG/ACT Suspension, Nasal) Active. Furosemide ('20MG'$  Tablet, Oral) Active. Gabapentin ('300MG'$  Capsule, Oral) Active. Lisinopril ('10MG'$  Tablet, Oral) Active. Levemir (100UNIT/ML Solution, Subcutaneous) Active. MetFORMIN HCl ER ('500MG'$  Tablet ER 24HR, Oral) Active. Nitrostat (0.'4MG'$  Tab Sublingual, Sublingual as needed) Active. Simvastatin ('40MG'$  Tablet, Oral) Active. Potassium Chloride Crys ER (20MEQ Tablet ER, Oral) Active. Omeprazole ('20MG'$  Capsule DR, Oral) Active. Invokana ('100MG'$  Tablet, Oral) Active. NovoLOG (100UNIT/ML Solution, Subcutaneous) Active. Isosorbide Mononitrate ER ('30MG'$  Tablet ER 24HR, Oral) Active. Carvedilol (6.'25MG'$  Tablet, Oral) Active. OxyCODONE HCl ('5MG'$  Tablet, Oral as needed) Active.    Vitals Cassidy [name] CMA; 06/14/2014 9:41 AM) 06/14/2014 9:40 AM Weight: 245.8 lb Height: 68in Body Surface Area: 2.31 m Body Mass Index: 37.37 kg/m Temp.: 98.64F(Oral)  Pulse: 92 (Regular)  BP: 160/90 (Sitting, Left Arm, Standard)     Physical Exam Cassidy Key K. Zanya Lindo MD; 06/14/2014 10:02 AM)  The physical exam findings are as  follows: Note:WDWN in NAD HEENT: EOMI, sclera anicteric Neck: No masses, no thyromegaly Lungs: CTA bilaterally; normal respiratory effort Breasts: Right breast - no deep induration or thickening. Nipple erythematous and fluctuant; exquisitely tender CV: Regular rate and rhythm; no murmurs Abd: +bowel sounds, soft, non-tender, no masses Ext: Well-perfused; no edema Skin: Warm, dry; no sign of jaundice    Assessment & Plan Cassidy Key K. Dashiel Bergquist MD; 06/14/2014 9:54 AM)  ABSCESS OF BREAST, RIGHT (611.0  N61)  Current Plans Schedule for Surgery- direct admit for incision and drainage of right breast abscess.  The surgical procedure has been discussed with the patient. Potential risks, benefits, alternative treatments, and expected outcomes have been explained. All of the patient's questions at this time have been answered. The likelihood of reaching the patient's treatment goal is good. The patient understand the proposed surgical procedure and wishes to proceed.  Cassidy [name]. Cassidy Dover, MD, [facility] Surgery  General/ Trauma Surgery  06/14/2014 10:03 AM

## 2014-06-15 DIAGNOSIS — N61 Inflammatory disorders of breast: Secondary | ICD-10-CM | POA: Diagnosis not present

## 2014-06-15 LAB — GLUCOSE, CAPILLARY: GLUCOSE-CAPILLARY: 185 mg/dL — AB (ref 65–99)

## 2014-06-15 MED ORDER — CIPROFLOXACIN HCL 500 MG PO TABS: 500.0000 mg | ORAL_TABLET | Freq: Two times a day (BID) | ORAL | Status: DC

## 2014-06-15 MED ORDER — OXYCODONE HCL 10 MG PO TABS: 10.0000 mg | ORAL_TABLET | Freq: Four times a day (QID) | ORAL | Status: DC | PRN

## 2014-06-15 MED ORDER — FLUCONAZOLE 150 MG PO TABS: 150.0000 mg | ORAL_TABLET | ORAL | Status: DC | PRN

## 2014-06-15 NOTE — Discharge Summary (Signed)
Physician Discharge Summary  Cassidy [name] OEV:[id number] DOB: 08-02-1965 DOA: 06/14/2014  PCP: Vonna Drafts., FNP  Consultation: none  Admit date: 06/14/2014 Discharge date: 06/15/2014  Recommendations for Outpatient Follow-up:   Follow-up Information    Follow up with TSUEI,MATTHEW K., MD In 2 weeks.   Specialty:  General Surgery   Contact information:   [address] [place] [address] [phone]      Discharge Diagnoses:  1. Right breast abscess   Surgical Procedure: I&D right breast abscess---Dr. Georgette Dover  Discharge Condition: stable Disposition: home  Diet recommendation: carb modified  Filed Weights   06/14/14 1438 06/14/14 1828  Weight: 111.358 kg (245 lb 8 oz) 111.676 kg (246 lb 3.2 oz)       Hospital Course:   Cassidy [name] underwent an I&D of recurrent right breast abscess.  Cassidy [name] had been on Cipro prior to surgery. Cassidy [name] tolerated the procedure well and was transferred to the floor.  On POD#1 the patient had minimal pain, afebrile, VSS, mobilizing and tolerating a diet.  Cassidy [name] was therefore felt stable for discharge.  Cassidy [name] has HH and knows how to change her dressing.  Cassidy [name] was asked to call with questions or concerns.     Physical Exam: General appearance: alert and oriented. Calm and cooperative No acute distress. VSS. Afebrile.  Skin: right breast wound is clean, no drainage   Discharge Instructions     Medication List    TAKE these medications        acetaminophen 500 MG tablet  Commonly known as:  TYLENOL  Take 1,000 mg by mouth every 6 (six) hours as needed for mild pain or moderate pain.     acyclovir 400 MG tablet  Commonly known as:  ZOVIRAX  Take 400 mg by mouth daily.     aspirin EC 81 MG tablet  Take 81 mg by mouth daily.     B-12 PO  Take 1 tablet by mouth daily.     canagliflozin 100 MG Tabs tablet  Commonly known as:  INVOKANA  TAKE 1 TABLET BY MOUTH EVERY MORNING     carvedilol 6.25 MG tablet  Commonly known  as:  COREG  TAKE 1 TABLET BY MOUTH TWICE DAILY WITH A MEAL.     ciprofloxacin 500 MG tablet  Commonly known as:  CIPRO  Take 1 tablet (500 mg total) by mouth 2 (two) times daily. 10 day course     clopidogrel 75 MG tablet  Commonly known as:  PLAVIX  TAKE 1 TABLET BY MOUTH EVERY DAY.     fluconazole 150 MG tablet  Commonly known as:  DIFLUCAN  Take 1 tablet (150 mg total) by mouth as needed (for yeast infections caused by Invokana).     fluticasone 50 MCG/ACT nasal spray  Commonly known as:  FLONASE  Place 1 spray into both nostrils daily as needed for allergies.     gabapentin 300 MG capsule  Commonly known as:  NEURONTIN  Take 600 mg by mouth 2 (two) times daily.     insulin aspart 100 UNIT/ML injection  Commonly known as:  novoLOG  Inject 15 Units into the skin 3 (three) times daily before meals.     NOVOLOG 100 UNIT/ML injection  Generic drug:  insulin aspart  INJECT 15 UNITS UNDER THE SKIN THREE TIMES DAILY BEFORE MEALS     INSULIN SYRINGE 1CC/31GX5/16" 31G X 5/16" 1 ML Misc  USE AS DIRECTED FOUR TIMES DAILY FOR INSULIN INJECTIONS  isosorbide mononitrate 30 MG 24 hr tablet  Commonly known as:  IMDUR  Take 0.5 tablets (15 mg total) by mouth daily.     LEVEMIR 100 UNIT/ML injection  Generic drug:  insulin detemir  INJECT 0.35 MLS INTO THE SKIN AT BEDTIME     lisinopril 10 MG tablet  Commonly known as:  PRINIVIL,ZESTRIL  Take 5 mg by mouth daily.     metFORMIN 500 MG 24 hr tablet  Commonly known as:  GLUCOPHAGE-XR  Take 1 tablet (500 mg total) by mouth daily with breakfast.     nitroGLYCERIN 0.4 MG SL tablet  Commonly known as:  NITROSTAT  Place 1 tablet (0.4 mg total) under the tongue every 5 (five) minutes as needed for chest pain.     omeprazole 20 MG capsule  Commonly known as:  PRILOSEC  Take 20 mg by mouth daily.     oxyCODONE 5 MG immediate release tablet  Commonly known as:  ROXICODONE  Take 1 tablet (5 mg total) by mouth every 6 (six) hours as  needed for severe pain.     Oxycodone HCl 10 MG Tabs  Take 1 tablet (10 mg total) by mouth every 6 (six) hours as needed for moderate pain.     simvastatin 40 MG tablet  Commonly known as:  ZOCOR  Take 1 tablet (40 mg total) by mouth daily at 6 PM.     traZODone 100 MG tablet  Commonly known as:  DESYREL  Take 50 mg by mouth at bedtime as needed for sleep.     triamcinolone cream 0.1 %  Commonly known as:  KENALOG  Apply 1 application topically as needed (eczema).           Follow-up Information    Follow up with Maia Petties., MD In 2 weeks.   Specialty:  General Surgery   Contact information:   [place] [address] [phone]        The results of significant diagnostics from this hospitalization (including imaging, microbiology, ancillary and laboratory) are listed below for reference.    Significant Diagnostic Studies: No results found.  Microbiology: Recent Results (from the past 240 hour(s))  Anaerobic culture     Status: None (Preliminary result)   Collection Time: 06/14/14  2:15 PM  Result Value Ref Range Status   Specimen Description TISSUE RIGHT BREAST  Final   Special Requests NONE  Final   Gram Stain PENDING  Incomplete   Culture   Final    NO ANAEROBES ISOLATED; CULTURE IN PROGRESS FOR 5 DAYS Performed at [facility]    Report Status PENDING  Incomplete  Anaerobic culture     Status: None (Preliminary result)   Collection Time: 06/14/14  3:42 PM  Result Value Ref Range Status   Specimen Description ABSCESS RIGHT BREAST  Final   Special Requests PT ON CIPRO  Final   Gram Stain PENDING  Incomplete   Culture   Final    NO ANAEROBES ISOLATED; CULTURE IN PROGRESS FOR 5 DAYS Performed at [facility]    Report Status PENDING  Incomplete  Culture, routine-abscess     Status: None (Preliminary result)   Collection Time: 06/14/14  3:42 PM  Result Value Ref Range Status   Specimen Description ABSCESS  RIGHT BREAST  Final   Special Requests PT ON CIPRO  Final   Gram Stain PENDING  Incomplete   Culture PENDING  Incomplete   Report Status PENDING  Incomplete     Labs: Basic Metabolic Panel:  Recent Labs Lab 06/14/14 1452  NA 138  K 3.5  CL 104  GLUCOSE 104*  BUN 7  CREATININE 0.60   Liver Function Tests: No results for input(s): AST, ALT, ALKPHOS, BILITOT, PROT, ALBUMIN in the last 168 hours. No results for input(s): LIPASE, AMYLASE in the last 168 hours. No results for input(s): AMMONIA in the last 168 hours. CBC:  Recent Labs Lab 06/14/14 1452  HGB 14.3  HCT 42.0   Cardiac Enzymes: No results for input(s): CKTOTAL, CKMB, CKMBINDEX, TROPONINI in the last 168 hours. BNP: BNP (last 3 results) No results for input(s): BNP in the last 8760 hours.  ProBNP (last 3 results) No results for input(s): PROBNP in the last 8760 hours.  CBG:  Recent Labs Lab 06/14/14 1432 06/14/14 1620 06/14/14 2134 06/15/14 0751  GLUCAP 100* 120* 246* 185*    Active Problems:   Abscess of right breast    Signed:  Jacy Brocker, ANP-BC

## 2014-06-15 NOTE — Progress Notes (Signed)
Pt ready for DC.  Follow up appt Dr. Georgette Dover for 2 weeks.  Pt has # to call.  Pt has done dressing changes multiple times at home before and understands how to do those.  Supplies given and explained.  Pt given rx and explained.  Confirmed with [facility] that pt is to take a 4 day course of PO Cipro.  Copy of DC instructions given and reviewed.

## 2014-06-17 ENCOUNTER — Encounter (HOSPITAL_COMMUNITY): Payer: Self-pay | Admitting: Surgery

## 2014-06-17 LAB — CULTURE, ROUTINE-ABSCESS: Gram Stain: NONE SEEN

## 2014-06-17 NOTE — Anesthesia Postprocedure Evaluation (Signed)
Anesthesia Post Note  Patient: Cassidy [name]  Procedure(s) Performed: Procedure(s) (LRB): INCISION AND DRAINAGE RIGHT BREAST ABSCESS (Right)  Anesthesia type: General  Patient location: PACU  Post pain: Pain level controlled  Post assessment: Post-op Vital signs reviewed  Last Vitals: BP 120/78 mmHg  Pulse 81  Temp(Src) 36.6 C (Oral)  Resp 18  Ht 5' 7.5" (1.715 m)  Wt 246 lb 3.2 oz (111.676 kg)  BMI 37.97 kg/m2  SpO2 96%  LMP 05/30/2014  Post vital signs: Reviewed  Level of consciousness: sedated  Complications: No apparent anesthesia complications

## 2014-06-18 ENCOUNTER — Other Ambulatory Visit: Payer: Self-pay | Admitting: Psychiatry

## 2014-06-18 DIAGNOSIS — M545 Low back pain: Secondary | ICD-10-CM

## 2014-06-18 LAB — TISSUE CULTURE: Gram Stain: NONE SEEN

## 2014-06-19 LAB — ANAEROBIC CULTURE
GRAM STAIN: NONE SEEN
GRAM STAIN: NONE SEEN

## 2014-07-04 ENCOUNTER — Encounter: Payer: Self-pay | Admitting: Cardiology

## 2014-07-04 ENCOUNTER — Ambulatory Visit (INDEPENDENT_AMBULATORY_CARE_PROVIDER_SITE_OTHER): Payer: Medicaid Other | Admitting: Cardiology

## 2014-07-04 VITALS — BP 142/86 | HR 81 | Ht 67.0 in | Wt 245.0 lb

## 2014-07-04 DIAGNOSIS — I2510 Atherosclerotic heart disease of native coronary artery without angina pectoris: Secondary | ICD-10-CM | POA: Diagnosis not present

## 2014-07-04 DIAGNOSIS — E785 Hyperlipidemia, unspecified: Secondary | ICD-10-CM

## 2014-07-04 DIAGNOSIS — I200 Unstable angina: Secondary | ICD-10-CM

## 2014-07-04 DIAGNOSIS — E1159 Type 2 diabetes mellitus with other circulatory complications: Secondary | ICD-10-CM

## 2014-07-04 DIAGNOSIS — I10 Essential (primary) hypertension: Secondary | ICD-10-CM

## 2014-07-04 DIAGNOSIS — Z9861 Coronary angioplasty status: Secondary | ICD-10-CM

## 2014-07-04 DIAGNOSIS — Z720 Tobacco use: Secondary | ICD-10-CM

## 2014-07-04 NOTE — Progress Notes (Signed)
PCP: Vonna Drafts., FNP  Clinic Note: Chief Complaint  Patient presents with  . Follow-up    Patient has felt some flutters, and her hands and feet swell.  . Coronary Artery Disease    Status post PCI    HPI: Cassidy [name] is a 49 y.o. female with a PMH below who presents today for annual f/u.  Notable PMH of CAD- h/o Unstable Angina - PTCA:May of 2014 with a positive nuclear stress test. She was taken to cardiac catheterization lab Dr. Shelva Majestic and found to have distal RCA 90-95% extending to the ostium of both the PDA and PLA. She was treated with scoring balloon angioplasty of both ostia, no stents were placed. Normal LEA dopplers.  Had Cath with PCI later on in October 2015: POST-OPERATIVE DIAGNOSIS:   Severe single-vessel disease with a roughly 80% stenosis in the mid LAD, positive FFR of 0.73.  Successful FFR guided PCI of the mid LAD with a Resolute Integrity DES 3.0 mm x 22 mm postdilated to 3.3 mm proximally and 3.55 mm distally.  Moderate disease in the circumflex at the trifurcation with widely patent PTCA site in the RCA.  FFR 0.86  Moderately elevated LVEDP with previously normal LVEF not evaluated today.   S/p 5th Sgx for R Breast abscess -- 6 more days of Abx.  Is on Diflucan for prevention of Yeast infection --> interaction with Simvastatin.  Past Medical History  Diagnosis Date  . Hypertension   . GERD (gastroesophageal reflux disease)   . Breast abscess     a. right side.   Marland Kitchen HLD (hyperlipidemia)   . Type II diabetes mellitus   . Anemia   . Sickle cell trait   . Fibromyalgia   . Genital warts   . Diabetic peripheral neuropathy   . Depression   . Bipolar depression   . PTSD (post-traumatic stress disorder)   . Tooth caries   . Pain in limb     a. LE VENOUS DUPLEX, 02/05/2009 - no evidence of deep vein thrombosis, Baker's cyst  . CAD (coronary artery disease)     a.  s/p PCTA to dRCA and ostium of branch extending into the PLA vessel and  PDA branch (05/2012)  b. [place] s/p DES to mLAD  . Tobacco abuse     Prior Cardiac Evaluation and Past Surgical History: Past Surgical History  Procedure Laterality Date  . Bladder surgery  1980    "TVT"  . Irrigation and debridement abscess  12/19/2010    Procedure: IRRIGATION AND DEBRIDEMENT ABSCESS;  Surgeon: Rolm Bookbinder, MD;  Location: [place];  Service: General;  Laterality: Right;  . Incise and drain abcess  ; 04/26/11; 08/13/11    right breast  . Tonsillectomy  1988  . Refractive surgery  ~ 2010    right  . Irrigation and debridement abscess  08/13/2011    Procedure: MINOR INCISION AND DRAINAGE OF ABSCESS;  Surgeon: Gwenyth Ober, MD;  Location: [facility];  Service: General;  Laterality: Right;  Right Breast   . Irrigation and debridement abscess  11/17/2011    Procedure: IRRIGATION AND DEBRIDEMENT ABSCESS;  Surgeon: Imogene Burn. Georgette Dover, MD;  Location: [facility];  Service: General;  Laterality: Right;  irrigation and debridement right recurrent breast abscess  . Breast surgery Right     I&D for multiple abscesses  . Eye surgery Left     laser surgery  . Larynx surgery    . Ployp removed from voice box  03/30/12    . Incision and drainage abscess Right 05/09/2012    Procedure: INCISION AND DRAINAGE RIGHT BREAST ABSCESS;  Surgeon: Imogene Burn. Georgette Dover, MD;  Location: [facility];  Service: General;  Laterality: Right;  . Nm myoview ltd  01/26/2009    Normal study, no evidence of ischemia, EF 67%  . Cutting balloon    . Cardiac catheterization  05/2012    see medical Hx.  . Coronary stent placement  11/08/2013    LAD   DES        dr harding   . Left heart catheterization with coronary angiogram N/A 05/29/2012    Procedure: LEFT HEART CATHETERIZATION WITH CORONARY ANGIOGRAM;  Surgeon: Troy Sine, MD;  Location: [facility] CATH LAB;  Service: Cardiovascular;  Laterality: N/A;  . Left heart catheterization with coronary angiogram N/A 11/08/2013    Procedure: LEFT HEART CATHETERIZATION WITH CORONARY ANGIOGRAM;   Surgeon: Leonie Man, MD;  Location: [facility] CATH LAB;  Service: Cardiovascular;  Laterality: N/A;  . Incision and drainage abscess Right 02/08/2014    Procedure: INCISION AND DRAINAGE RIGHT BREAST ABSCESS;  Surgeon: Donnie Mesa, MD;  Location: [place];  Service: General;  Laterality: Right;  . Incision and drainage abscess Right 06/14/2014    Procedure: INCISION AND DRAINAGE RIGHT BREAST ABSCESS;  Surgeon: Donnie Mesa, MD;  Location: [place];  Service: General;  Laterality: Right;    Interval History: Presents for ~ 1 yr f/u - doing well overall from CAD standpoint.  Major issue has been the Breast Abscess. Occasional, short lived flutters, but no long-lived rapid HR.  No more CP/ Angina since Imdur. She is really working on her exercising and dietary modification.  Her weight had been as high as 249 pounds. She is losing the weight back but is not yet back to her she had been.  No chest pain or shortness of breath with rest or exertion. No PND, orthopnea or edema.  No lightheadedness, dizziness, weakness or syncope/near syncope.  Does have balance issues. No TIA/amaurosis fugax symptoms. No melena, hematochezia, hematuria, or epstaxis. No claudication - has LE PN @ end of night.  ROS: A comprehensive was performed. Review of Systems  Constitutional: Negative for fever and chills.  Respiratory: Negative for cough, shortness of breath and wheezing.   Gastrointestinal: Negative for blood in stool and melena.  Genitourinary: Negative for hematuria.  Musculoskeletal: Negative.        Recovering from right breast surgery. She is currently doing wet-to-dry dressing changes.  Endo/Heme/Allergies: Does not bruise/bleed easily.  All other systems reviewed and are negative.   Current Outpatient Prescriptions on File Prior to Visit  Medication Sig Dispense Refill  . acyclovir (ZOVIRAX) 400 MG tablet Take 400 mg by mouth daily.   0  . aspirin EC 81 MG tablet Take 81 mg by mouth daily.    .  canagliflozin (INVOKANA) 100 MG TABS tablet TAKE 1 TABLET BY MOUTH EVERY MORNING 30 tablet 1  . carvedilol (COREG) 6.25 MG tablet TAKE 1 TABLET BY MOUTH TWICE DAILY WITH A MEAL. 60 tablet 11  . ciprofloxacin (CIPRO) 500 MG tablet Take 1 tablet (500 mg total) by mouth 2 (two) times daily. 10 day course 8 tablet 0  . clopidogrel (PLAVIX) 75 MG tablet TAKE 1 TABLET BY MOUTH EVERY DAY. 30 tablet 11  . Cyanocobalamin (B-12 PO) Take 1 tablet by mouth daily.     . fluconazole (DIFLUCAN) 150 MG tablet Take 1 tablet (150 mg total) by mouth as  needed (for yeast infections caused by Invokana). 2 tablet 0  . fluticasone (FLONASE) 50 MCG/ACT nasal spray Place 1 spray into both nostrils daily as needed for allergies.   5  . gabapentin (NEURONTIN) 300 MG capsule Take 600 mg by mouth 2 (two) times daily.     . insulin aspart (NOVOLOG) 100 UNIT/ML injection Inject 15 Units into the skin 3 (three) times daily before meals.    . Insulin Syringe-Needle U-100 (INSULIN SYRINGE 1CC/31GX5/16") 31G X 5/16" 1 ML MISC USE AS DIRECTED FOUR TIMES DAILY FOR INSULIN INJECTIONS 150 each 11  . isosorbide mononitrate (IMDUR) 30 MG 24 hr tablet Take 0.5 tablets (15 mg total) by mouth daily. 30 tablet 6  . LEVEMIR 100 UNIT/ML injection INJECT 0.35 MLS INTO THE SKIN AT BEDTIME 20 mL 2  . lisinopril (PRINIVIL,ZESTRIL) 10 MG tablet Take 5 mg by mouth daily.     . metFORMIN (GLUCOPHAGE-XR) 500 MG 24 hr tablet Take 1 tablet (500 mg total) by mouth daily with breakfast. 30 tablet 11  . nitroGLYCERIN (NITROSTAT) 0.4 MG SL tablet Place 1 tablet (0.4 mg total) under the tongue every 5 (five) minutes as needed for chest pain. 25 tablet 12  . NOVOLOG 100 UNIT/ML injection INJECT 15 UNITS UNDER THE SKIN THREE TIMES DAILY BEFORE MEALS 20 mL 0  . omeprazole (PRILOSEC) 20 MG capsule Take 20 mg by mouth daily.    . simvastatin (ZOCOR) 40 MG tablet Take 1 tablet (40 mg total) by mouth daily at 6 PM. 30 tablet 11  . traZODone (DESYREL) 100 MG tablet  Take 50 mg by mouth at bedtime as needed for sleep.     Marland Kitchen triamcinolone cream (KENALOG) 0.1 % Apply 1 application topically as needed (eczema).   1   No current facility-administered medications on file prior to visit.   Allergies  Allergen Reactions  . Amoxicillin Hives    History  Substance Use Topics  . Smoking status: Current Every Day Smoker -- 0.25 packs/day for 30 years    Types: Cigarettes  . Smokeless tobacco: Never Used     Comment: 08/14/11 "quit for 3 wk 05/2011; was smoking 1 ppd since age 40 before I quit; at least I've cut down to 1/4 ppd"  . 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   Wt Readings from Last 3 Encounters:  07/04/14 111.131 kg (245 lb)  06/14/14 111.676 kg (246 lb 3.2 oz)  02/08/14 109.408 kg (241 lb 3.2 oz)  Has been exercising. (had been up to 249) -- 3 x / week walking; also working on dietary modification.  PHYSICAL EXAM BP 142/86 mmHg  Pulse 81  Ht '5\' 7"'$  (1.702 m)  Wt 111.131 kg (245 lb)  BMI 38.36 kg/m2  LMP 05/30/2014 General appearance: alert, cooperative, appears stated age, no distress and moderately obese Neck: no adenopathy, no carotid bruit and no JVD Lungs: clear to auscultation bilaterally, normal percussion bilaterally and non-labored Heart: regular rate and rhythm, S1, S2 normal, no murmur, click, rub or gallop; nondisplaced PMI Abdomen: soft, non-tender; bowel sounds normal; no masses, no organomegaly; no HJR Extremities: extremities normal, atraumatic, no cyanosis, and edema  Pulses: 2+ and symmetric; Skin: normal  Neurologic: Mental status: Alert, oriented, thought  content appropriate Cranial nerves: normal (II-XII grossly  intact)    Adult ECG Report  Rate: 81 ;  Rhythm: normal sinus rhythm and Low voltage. Cannot rule out septal infarct, age undetermined. Otherwise normal axis and intervals.  Narrative Interpretation: stable EKG  Recent Labs:    Lab Results  Component Value Date   CHOL 187 10/11/2013   HDL 43.00 10/11/2013   LDLCALC 119* 10/11/2013   TRIG 125.0 10/11/2013   CHOLHDL 4 10/11/2013     Other studies Reviewed: Additional studies/ records that were reviewed today include: no new studies Review of the above records demonstrates:   ASSESSMENT / PLAN: Problem List Items Addressed This Visit    CAD S/P percutaneous coronary angioplasty (Chronic)    Now that interventions on the RCA system as well as LAD. Symptoms relatively well controlled now she is on Imdur. She is on aspirin plus Plavix. With her being a diabetic I would continue on the on one year. She is also on lisinopril for her diabetes and poor taking a small dose. On moderate dose of beta blocker. Also on statin.      Relevant Orders   Lipid panel   Comprehensive metabolic panel   Hemoglobin A1c   Essential hypertension    We can probably increase the dose of ACE inhibitor. Apparently she did not take her medications yet this morning. For now we'll continue this current regimen. If pressures are elevated, would increase lisinopril to full tablet.      Relevant Orders   Lipid panel   Comprehensive metabolic panel   Hemoglobin A1c   Hyperlipidemia with target LDL less than 70 (Chronic)    On simvastatin. Has a longtime since last labs were checked. Will order fasting lipid panel plus lipids to reassess her progression on simvastatin.      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 We  will check a hemoglobin A1c in addition to chemistry panel for the glucose levels      Unstable angina pectoris - Primary    No further symptoms since her cardiac catheterization/PCI and addition of Imdur.      Relevant Orders   Lipid panel   Comprehensive metabolic panel   Hemoglobin A1c      Current medicines are reviewed at length with the patient today. (+/- concerns) N/A The following changes have been made: N/A labs/ tests ordered today include:   Orders Placed This Encounter  Procedures  . Lipid panel    Order Specific Question:  Has the patient fasted?    Answer:  Yes  . Comprehensive metabolic panel    Order Specific Question:  Has the patient fasted?    Answer:  Yes  . Hemoglobin A1c   No orders of the defined types were placed in this encounter.     Followup: November 2016    Leonie Man, M.D., M.S. Interventional Cardiologist   Pager # [phone]

## 2014-07-04 NOTE — Patient Instructions (Signed)
PLEASE DO LABS-- DO NOT EAT OR DRINK THE MORNING OF THE TEST.  KEEP A TRACK OF BLOOD PRESSURE- CONTACT OFFICE WITH RESULTS. MAY INCREASE MEDICATIONS.   Your physician wants you to follow-up in NOV 2016   DR HARDING. You will receive a reminder letter in the mail two months in advance. If you don't receive a letter, please call our office to schedule the follow-up appointment.

## 2014-07-06 ENCOUNTER — Encounter: Payer: Self-pay | Admitting: Cardiology

## 2014-07-06 NOTE — Assessment & Plan Note (Signed)
We can probably increase the dose of ACE inhibitor. Apparently she did not take her medications yet this morning. For now we'll continue this current regimen. If pressures are elevated, would increase lisinopril to full tablet.

## 2014-07-06 NOTE — Assessment & Plan Note (Signed)
No further symptoms since her cardiac catheterization/PCI and addition of Imdur.

## 2014-07-06 NOTE — Assessment & Plan Note (Signed)
Now that interventions on the RCA system as well as LAD. Symptoms relatively well controlled now she is on Imdur. She is on aspirin plus Plavix. With her being a diabetic I would continue on the on one year. She is also on lisinopril for her diabetes and poor taking a small dose. On moderate dose of beta blocker. Also on statin.

## 2014-07-06 NOTE — Assessment & Plan Note (Signed)
On simvastatin. Has a longtime since last labs were checked. Will order fasting lipid panel plus lipids to reassess her progression on simvastatin.

## 2014-07-06 NOTE — Assessment & Plan Note (Signed)
Has not had a full evaluation in a while. We will check a hemoglobin A1c in addition to chemistry panel for the glucose levels

## 2014-07-06 NOTE — Assessment & Plan Note (Signed)
" >>  ASSESSMENT AND PLAN FOR UNSTABLE ANGINA PECTORIS (HCC) WRITTEN ON 07/06/2014 10:58 PM BY Maripat Borba W, MD  No further symptoms since her cardiac catheterization/PCI and addition of Imdur . "

## 2014-07-08 ENCOUNTER — Other Ambulatory Visit: Payer: Self-pay

## 2014-07-19 NOTE — Addendum Note (Signed)
Addended by: Venetia Maxon on: 07/19/2014 09:21 AM   Modules accepted: Orders

## 2014-07-23 ENCOUNTER — Ambulatory Visit
Admission: RE | Admit: 2014-07-23 | Discharge: 2014-07-23 | Disposition: A | Discharge status: Home / Self Care | Payer: Medicaid Other | Source: Ambulatory Visit | Attending: Psychiatry | Admitting: Psychiatry

## 2014-07-23 DIAGNOSIS — M545 Low back pain: Secondary | ICD-10-CM

## 2014-07-25 NOTE — Addendum Note (Signed)
Addended by: Diana Eves on: 07/25/2014 05:26 PM   Modules accepted: Orders

## 2014-08-07 ENCOUNTER — Other Ambulatory Visit: Payer: Self-pay | Admitting: Internal Medicine

## 2014-08-19 ENCOUNTER — Ambulatory Visit: Payer: Medicaid Other | Admitting: Obstetrics

## 2014-08-20 ENCOUNTER — Ambulatory Visit: Payer: Medicaid Other | Admitting: Obstetrics

## 2014-08-26 ENCOUNTER — Ambulatory Visit: Payer: Medicaid Other | Admitting: Obstetrics

## 2014-10-10 ENCOUNTER — Encounter: Payer: Self-pay | Admitting: Obstetrics

## 2014-10-10 ENCOUNTER — Ambulatory Visit (INDEPENDENT_AMBULATORY_CARE_PROVIDER_SITE_OTHER): Payer: Medicaid Other | Admitting: Obstetrics

## 2014-10-10 VITALS — BP 137/84 | HR 93 | Wt 249.0 lb

## 2014-10-10 DIAGNOSIS — L0232 Furuncle of buttock: Secondary | ICD-10-CM

## 2014-10-10 DIAGNOSIS — Z01419 Encounter for gynecological examination (general) (routine) without abnormal findings: Secondary | ICD-10-CM

## 2014-10-10 DIAGNOSIS — IMO0002 Reserved for concepts with insufficient information to code with codable children: Secondary | ICD-10-CM

## 2014-10-10 DIAGNOSIS — Z0000 Encounter for general adult medical examination without abnormal findings: Secondary | ICD-10-CM | POA: Diagnosis not present

## 2014-10-10 DIAGNOSIS — N946 Dysmenorrhea, unspecified: Secondary | ICD-10-CM

## 2014-10-10 MED ORDER — CLINDAMYCIN PHOSPHATE 1 % EX LOTN: TOPICAL_LOTION | Freq: Two times a day (BID) | CUTANEOUS | Status: DC

## 2014-10-10 MED ORDER — OXYCODONE HCL 10 MG PO TABS: 10.0000 mg | ORAL_TABLET | Freq: Four times a day (QID) | ORAL | Status: DC | PRN

## 2014-10-10 NOTE — Progress Notes (Signed)
Subjective:        Cassidy [name] is a 49 y.o. female here for a routine exam.  Current complaints: Painful intercourse.  Painful and heavy periods.  Boil on buttocks.   Personal health questionnaire:  Is patient [name], have a family history of breast and/or ovarian cancer: no Is there a family history of uterine cancer diagnosed at age < 71, gastrointestinal cancer, urinary tract cancer, family member who is a [name]: no Is the patient overweight and hypertensive, family history of diabetes, personal history of gestational diabetes, preeclampsia or PCOS: yes Is patient over 46, have PCOS,  family history of premature CHD under age 6, diabetes, smoke, have hypertension or peripheral artery disease:  yes At any time, has a partner hit, kicked or otherwise hurt or frightened you?: no Over the past 2 weeks, have you felt down, depressed or hopeless?: no Over the past 2 weeks, have you felt little interest or pleasure in doing things?:no   Gynecologic History Patient's last menstrual period was 06/12/2014. Contraception: none Last Pap: 2015. Results were: normal Last mammogram: 2015. Results were: normal  Obstetric History OB History  Gravida Para Term Preterm AB SAB TAB Ectopic Multiple Living  '1 1 1       1    '$ # Outcome Date GA Lbr Len/2nd Weight Sex Delivery Anes PTL Lv  1 Term               Past Medical History  Diagnosis Date  . Hypertension   . GERD (gastroesophageal reflux disease)   . Breast abscess     a. right side.   Marland Kitchen HLD (hyperlipidemia)   . Type II diabetes mellitus   . Anemia   . Sickle cell trait   . Fibromyalgia   . Genital warts   . Diabetic peripheral neuropathy   . Depression   . Bipolar depression   . PTSD (post-traumatic stress disorder)   . Tooth caries   . Pain in limb     a. LE VENOUS DUPLEX, 02/05/2009 - no evidence of deep vein thrombosis, Baker's cyst  . CAD (coronary artery disease)     a.  s/p  PCTA to dRCA and ostium of branch extending into the PLA vessel and PDA branch (05/2012)  b. [place] s/p DES to mLAD  . Tobacco abuse     Past Surgical History  Procedure Laterality Date  . Bladder surgery  1980    "TVT"  . Irrigation and debridement abscess  12/19/2010    Procedure: IRRIGATION AND DEBRIDEMENT ABSCESS;  Surgeon: Rolm Bookbinder, MD;  Location: [place];  Service: General;  Laterality: Right;  . Incise and drain abcess  ; 04/26/11; 08/13/11    right breast  . Tonsillectomy  1988  . Refractive surgery  ~ 2010    right  . Irrigation and debridement abscess  08/13/2011    Procedure: MINOR INCISION AND DRAINAGE OF ABSCESS;  Surgeon: Gwenyth Ober, MD;  Location: [place];  Service: General;  Laterality: Right;  Right Breast   . Irrigation and debridement abscess  11/17/2011    Procedure: IRRIGATION AND DEBRIDEMENT ABSCESS;  Surgeon: Imogene Burn. Georgette Dover, MD;  Location: [place] [facility];  Service: General;  Laterality: Right;  irrigation and debridement right recurrent breast abscess  . Breast surgery Right     I&D for multiple abscesses  . Eye surgery Left     laser surgery  . Larynx surgery    . Ployp removed from  voice box 03/30/12    . Incision and drainage abscess Right 05/09/2012    Procedure: INCISION AND DRAINAGE RIGHT BREAST ABSCESS;  Surgeon: Imogene Burn. Georgette Dover, MD;  Location: [place];  Service: General;  Laterality: Right;  . Nm myoview ltd  01/26/2009    Normal study, no evidence of ischemia, EF 67%  . Cutting balloon    . Cardiac catheterization  05/2012    see medical Hx.  . Coronary stent placement  11/08/2013    LAD   DES        dr harding   . Left heart catheterization with coronary angiogram N/A 05/29/2012    Procedure: LEFT HEART CATHETERIZATION WITH CORONARY ANGIOGRAM;  Surgeon: Troy Sine, MD;  Location: [facility] CATH LAB;  Service: Cardiovascular;  Laterality: N/A;  . Left heart catheterization with coronary angiogram N/A 11/08/2013    Procedure: LEFT HEART CATHETERIZATION WITH CORONARY  ANGIOGRAM;  Surgeon: Leonie Man, MD;  Location: [facility] C[facility] LAB;  Service: Cardiovascular;  Laterality: N/A;  . Incision and drainage abscess Right 02/08/2014    Procedure: INCISION AND DRAINAGE RIGHT BREAST ABSCESS;  Surgeon: Donnie Mesa, MD;  Location: [place];  Service: General;  Laterality: Right;  . Incision and drainage abscess Right 06/14/2014    Procedure: INCISION AND DRAINAGE RIGHT BREAST ABSCESS;  Surgeon: Donnie Mesa, MD;  Location: [place];  Service: General;  Laterality: Right;     Current outpatient prescriptions:  .  acyclovir (ZOVIRAX) 400 MG tablet, Take 400 mg by mouth daily. , Disp: , Rfl: 0 .  aspirin EC 81 MG tablet, Take 81 mg by mouth daily., Disp: , Rfl:  .  carvedilol (COREG) 6.25 MG tablet, TAKE 1 TABLET BY MOUTH TWICE DAILY WITH A MEAL., Disp: 60 tablet, Rfl: 11 .  clopidogrel (PLAVIX) 75 MG tablet, TAKE 1 TABLET BY MOUTH EVERY DAY., Disp: 30 tablet, Rfl: 11 .  Cyanocobalamin (B-12 PO), Take 1 tablet by mouth daily. , Disp: , Rfl:  .  fluticasone (FLONASE) 50 MCG/ACT nasal spray, Place 1 spray into both nostrils daily as needed for allergies. , Disp: , Rfl: 5 .  gabapentin (NEURONTIN) 300 MG capsule, Take 600 mg by mouth 2 (two) times daily. , Disp: , Rfl:  .  insulin aspart (NOVOLOG) 100 UNIT/ML injection, Inject 15 Units into the skin 3 (three) times daily before meals., Disp: , Rfl:  .  Insulin Syringe-Needle U-100 (INSULIN SYRINGE 1CC/31GX5/16") 31G X 5/16" 1 ML MISC, USE AS DIRECTED FOUR TIMES DAILY FOR INSULIN INJECTIONS, Disp: 150 each, Rfl: 11 .  isosorbide mononitrate (IMDUR) 30 MG 24 hr tablet, Take 0.5 tablets (15 mg total) by mouth daily., Disp: 30 tablet, Rfl: 6 .  LEVEMIR 100 UNIT/ML injection, INJECT 0.35 MLS INTO THE SKIN AT BEDTIME, Disp: 20 mL, Rfl: 2 .  lisinopril (PRINIVIL,ZESTRIL) 10 MG tablet, Take 5 mg by mouth daily. , Disp: , Rfl:  .  metFORMIN (GLUCOPHAGE-XR) 500 MG 24 hr tablet, Take 1 tablet (500 mg total) by mouth daily with breakfast., Disp:  30 tablet, Rfl: 11 .  nitroGLYCERIN (NITROSTAT) 0.4 MG SL tablet, Place 1 tablet (0.4 mg total) under the tongue every 5 (five) minutes as needed for chest pain., Disp: 25 tablet, Rfl: 12 .  NOVOLOG 100 UNIT/ML injection, INJECT 15 UNITS UNDER THE SKIN THREE TIMES DAILY BEFORE MEALS, Disp: 20 mL, Rfl: 0 .  simvastatin (ZOCOR) 40 MG tablet, Take 1 tablet (40 mg total) by mouth daily at 6 PM., Disp: 30 tablet, Rfl:  11 .  traZODone (DESYREL) 100 MG tablet, Take 50 mg by mouth at bedtime as needed for sleep. , Disp: , Rfl:  .  triamcinolone cream (KENALOG) 0.1 %, Apply 1 application topically as needed (eczema). , Disp: , Rfl: 1 .  canagliflozin (INVOKANA) 100 MG TABS tablet, TAKE 1 TABLET BY MOUTH EVERY MORNING (Patient not taking: Reported on 10/10/2014), Disp: 30 tablet, Rfl: 1 .  ciprofloxacin (CIPRO) 500 MG tablet, Take 1 tablet (500 mg total) by mouth 2 (two) times daily. 10 day course (Patient not taking: Reported on 10/10/2014), Disp: 8 tablet, Rfl: 0 .  clindamycin (CLEOCIN-T) 1 % lotion, Apply topically 2 (two) times daily., Disp: 60 mL, Rfl: 2 .  fluconazole (DIFLUCAN) 150 MG tablet, Take 1 tablet (150 mg total) by mouth as needed (for yeast infections caused by Invokana). (Patient not taking: Reported on 10/10/2014), Disp: 2 tablet, Rfl: 0 .  omeprazole (PRILOSEC) 20 MG capsule, Take 20 mg by mouth daily., Disp: , Rfl:  .  Oxycodone HCl 10 MG TABS, Take 1 tablet (10 mg total) by mouth every 6 (six) hours as needed., Disp: 40 tablet, Rfl: 0 Allergies  Allergen Reactions  . Amoxicillin Hives    Social History  Substance Use Topics  . Smoking status: Current Every Day Smoker -- 0.25 packs/day for 30 years    Types: Cigarettes  . Smokeless tobacco: Never Used     Comment: 08/14/11 "quit for 3 wk 05/2011; was smoking 1 ppd since age 79 before I quit; at least I've cut down to 1/4 ppd"  . 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 COPD Mother   . Cancer Mother     lymphoma  . Heart disease Mother   . Cancer Maternal Aunt     breast, colon  . Hyperlipidemia Father   . Heart disease Father   . Hypertension Brother   . Hypertension Maternal Grandmother   . Diabetes Maternal Grandmother       Review of Systems  Constitutional: negative for fatigue and weight loss Respiratory: negative for cough and wheezing Cardiovascular: negative for chest pain, fatigue and palpitations Gastrointestinal: positive for abdominal pain and negative for change in bowel habits Musculoskeletal: positive for myalgias Neurological: negative for gait problems and tremors Behavioral/Psych: negative for abusive relationship, depression Endocrine: negative for temperature intolerance   Genitourinary: positive for abnormal menstrual periods, genital lesions, hot flashes, sexual problems  Integument/breast: negative for breast lump, breast tenderness, nipple discharge and skin lesion(s)    Objective:       BP 137/84 mmHg  Pulse 93  Wt 249 lb (112.946 kg)  LMP 06/12/2014 General:   alert  Skin:   no rash or abnormalities  Lungs:   clear to auscultation bilaterally  Heart:   regular rate and rhythm, S1, S2 normal, no murmur, click, rub or gallop  Breasts:   normal without suspicious masses, skin or nipple changes or axillary nodes  Abdomen:  normal findings: no organomegaly, soft, non-tender and no hernia  Pelvis:  External genitalia: normal general appearance Urinary system: urethral meatus normal and bladder without fullness, nontender Vaginal: normal without tenderness, induration or masses Cervix: normal appearance Adnexa: normal bimanual exam Uterus: anteverted and non-tender, normal size   Lab Review Urine pregnancy test Labs reviewed yes Radiologic studies reviewed yes    Assessment:    Normal gyn exam  Boil on buttocks  Dysmenorrhea   Dyspareunia  Chronic  Pain Syndrome.  Pain management needed  Perimenopausal.  No period since June.   Plan:     Oxycodone Rx  Clindamycin lotion Rx   Education reviewed: calcium supplements, depression evaluation, low fat, low cholesterol diet, safe sex/STD prevention, self breast exams, smoking cessation and weight bearing exercise. Follow up in: 1 year.   Meds ordered this encounter  Medications  . Oxycodone HCl 10 MG TABS    Sig: Take 1 tablet (10 mg total) by mouth every 6 (six) hours as needed.    Dispense:  40 tablet    Refill:  0  . clindamycin (CLEOCIN-T) 1 % lotion    Sig: Apply topically 2 (two) times daily.    Dispense:  60 mL    Refill:  2   Orders Placed This Encounter  Procedures  . SureSwab, Vaginosis/Vaginitis Plus

## 2014-10-11 ENCOUNTER — Other Ambulatory Visit: Payer: Self-pay | Admitting: Obstetrics

## 2014-10-11 DIAGNOSIS — B373 Candidiasis of vulva and vagina: Secondary | ICD-10-CM

## 2014-10-11 DIAGNOSIS — B3731 Acute candidiasis of vulva and vagina: Secondary | ICD-10-CM

## 2014-10-11 NOTE — Telephone Encounter (Signed)
Patient wants to know if Dr Jodi Mourning is going to RF her Diflucan. Spoke to Dr Jodi Mourning and [place] to fill.

## 2014-10-14 ENCOUNTER — Other Ambulatory Visit: Payer: Self-pay | Admitting: Obstetrics

## 2014-10-14 ENCOUNTER — Other Ambulatory Visit: Payer: Self-pay | Admitting: Internal Medicine

## 2014-10-14 DIAGNOSIS — N760 Acute vaginitis: Principal | ICD-10-CM

## 2014-10-14 DIAGNOSIS — B9689 Other specified bacterial agents as the cause of diseases classified elsewhere: Secondary | ICD-10-CM

## 2014-10-14 LAB — SURESWAB, VAGINOSIS/VAGINITIS PLUS
Atopobium vaginae: 5.9 Log (cells/mL)
BV CATEGORY: UNDETERMINED — AB
C. ALBICANS, DNA: DETECTED — AB
C. PARAPSILOSIS, DNA: NOT DETECTED
C. TRACHOMATIS RNA, TMA: NOT DETECTED
C. glabrata, DNA: DETECTED — AB
C. tropicalis, DNA: NOT DETECTED
GARDNERELLA VAGINALIS: 6.6 Log (cells/mL)
LACTOBACILLUS SPECIES: 6.5 Log (cells/mL)
MEGASPHAERA SPECIES: 7.2 Log (cells/mL)
N. gonorrhoeae RNA, TMA: NOT DETECTED
T. VAGINALIS RNA, QL TMA: NOT DETECTED

## 2014-10-14 LAB — PAP, TP IMAGING W/ HPV RNA, RFLX HPV TYPE 16,18/45: HPV mRNA, High Risk: NOT DETECTED

## 2014-10-14 MED ORDER — METRONIDAZOLE 500 MG PO TABS: 500.0000 mg | ORAL_TABLET | Freq: Two times a day (BID) | ORAL | Status: DC

## 2014-10-30 ENCOUNTER — Emergency Department (HOSPITAL_COMMUNITY)
Admission: EM | Admit: 2014-10-30 | Discharge: 2014-10-30 | Disposition: A | Discharge status: Home / Self Care | Payer: Medicaid Other | Attending: Emergency Medicine | Admitting: Emergency Medicine

## 2014-10-30 ENCOUNTER — Encounter (HOSPITAL_COMMUNITY): Payer: Self-pay | Admitting: Neurology

## 2014-10-30 DIAGNOSIS — Z7902 Long term (current) use of antithrombotics/antiplatelets: Secondary | ICD-10-CM | POA: Diagnosis not present

## 2014-10-30 DIAGNOSIS — E1140 Type 2 diabetes mellitus with diabetic neuropathy, unspecified: Secondary | ICD-10-CM | POA: Diagnosis not present

## 2014-10-30 DIAGNOSIS — I10 Essential (primary) hypertension: Secondary | ICD-10-CM | POA: Diagnosis not present

## 2014-10-30 DIAGNOSIS — E119 Type 2 diabetes mellitus without complications: Secondary | ICD-10-CM | POA: Insufficient documentation

## 2014-10-30 DIAGNOSIS — M797 Fibromyalgia: Secondary | ICD-10-CM | POA: Insufficient documentation

## 2014-10-30 DIAGNOSIS — I878 Other specified disorders of veins: Secondary | ICD-10-CM | POA: Diagnosis not present

## 2014-10-30 DIAGNOSIS — Z794 Long term (current) use of insulin: Secondary | ICD-10-CM | POA: Insufficient documentation

## 2014-10-30 DIAGNOSIS — F319 Bipolar disorder, unspecified: Secondary | ICD-10-CM | POA: Insufficient documentation

## 2014-10-30 DIAGNOSIS — I2510 Atherosclerotic heart disease of native coronary artery without angina pectoris: Secondary | ICD-10-CM | POA: Diagnosis not present

## 2014-10-30 DIAGNOSIS — Z862 Personal history of diseases of the blood and blood-forming organs and certain disorders involving the immune mechanism: Secondary | ICD-10-CM | POA: Insufficient documentation

## 2014-10-30 DIAGNOSIS — Z880 Allergy status to penicillin: Secondary | ICD-10-CM | POA: Insufficient documentation

## 2014-10-30 DIAGNOSIS — R2243 Localized swelling, mass and lump, lower limb, bilateral: Secondary | ICD-10-CM | POA: Insufficient documentation

## 2014-10-30 DIAGNOSIS — Z720 Tobacco use: Secondary | ICD-10-CM | POA: Insufficient documentation

## 2014-10-30 DIAGNOSIS — E785 Hyperlipidemia, unspecified: Secondary | ICD-10-CM | POA: Diagnosis not present

## 2014-10-30 DIAGNOSIS — Z79899 Other long term (current) drug therapy: Secondary | ICD-10-CM | POA: Diagnosis not present

## 2014-10-30 DIAGNOSIS — K219 Gastro-esophageal reflux disease without esophagitis: Secondary | ICD-10-CM | POA: Diagnosis not present

## 2014-10-30 DIAGNOSIS — Z7982 Long term (current) use of aspirin: Secondary | ICD-10-CM | POA: Diagnosis not present

## 2014-10-30 DIAGNOSIS — E1142 Type 2 diabetes mellitus with diabetic polyneuropathy: Secondary | ICD-10-CM

## 2014-10-30 LAB — BASIC METABOLIC PANEL
Anion gap: 9 (ref 5–15)
BUN: 9 mg/dL (ref 6–20)
CHLORIDE: 102 mmol/L (ref 101–111)
CO2: 28 mmol/L (ref 22–32)
Calcium: 9.3 mg/dL (ref 8.9–10.3)
Creatinine, Ser: 0.82 mg/dL (ref 0.44–1.00)
GFR calc non Af Amer: >60 mL/min (ref >60)
Glucose, Bld: 145 mg/dL — ABNORMAL HIGH (ref 65–99)
Potassium: 3.8 mmol/L (ref 3.5–5.1)
Sodium: 139 mmol/L (ref 135–145)

## 2014-10-30 LAB — CBC
HCT: 39 % (ref 36.0–46.0)
HEMOGLOBIN: 13.3 g/dL (ref 12.0–15.0)
MCH: 29.9 pg (ref 26.0–34.0)
MCHC: 34.1 g/dL (ref 30.0–36.0)
MCV: 87.6 fL (ref 78.0–100.0)
Platelets: 316 10*3/uL (ref 150–400)
RBC: 4.45 MIL/uL (ref 3.87–5.11)
RDW: 13.5 % (ref 11.5–15.5)
WBC: 9.2 10*3/uL (ref 4.0–10.5)

## 2014-10-30 NOTE — ED Notes (Signed)
Pt reports swelling to BLE and both hands for 3 days. Feels burning to extremities. Has CHF, has been taking her medicines. Denies cp or sob.

## 2014-10-30 NOTE — ED Provider Notes (Signed)
CSN: [id number]     Arrival date & time 10/30/14  1434 History   First MD Initiated Contact with Patient 10/30/14 1546     Chief Complaint  Patient presents with  . Leg Swelling     (Consider location/radiation/quality/duration/timing/severity/associated sxs/prior Treatment) Patient is a 49 y.o. female presenting with leg pain.  Leg Pain Location:  Foot and leg Injury: no   Leg location:  L lower leg and R lower leg Pain details:    Quality:  Tingling, sharp, shooting and burning   Severity:  Moderate   Onset quality:  Gradual   Timing:  Constant   Progression:  Worsening Chronicity:  Recurrent Associated symptoms: numbness, swelling and tingling   Associated symptoms: no back pain, no decreased ROM, no fever and no muscle weakness     Past Medical History  Diagnosis Date  . Hypertension   . GERD (gastroesophageal reflux disease)   . Breast abscess     a. right side.   Marland Kitchen HLD (hyperlipidemia)   . Type II diabetes mellitus ([place])   . Anemia   . Sickle cell trait ([place])   . Fibromyalgia   . Genital warts   . Diabetic peripheral neuropathy ([place])   . Depression   . Bipolar depression ([place])   . PTSD (post-traumatic stress disorder)   . Tooth caries   . Pain in limb     a. LE VENOUS DUPLEX, 02/05/2009 - no evidence of deep vein thrombosis, Baker's cyst  . CAD (coronary artery disease)     a.  s/p PCTA to dRCA and ostium of branch extending into the PLA vessel and PDA branch (05/2012)  b. [place] s/p DES to mLAD  . Tobacco abuse    Past Surgical History  Procedure Laterality Date  . Bladder surgery  1980    "TVT"  . Irrigation and debridement abscess  12/19/2010    Procedure: IRRIGATION AND DEBRIDEMENT ABSCESS;  Surgeon: Rolm Bookbinder, MD;  Location: [place];  Service: General;  Laterality: Right;  . Incise and drain abcess  ; 04/26/11; 08/13/11    right breast  . Tonsillectomy  1988  . Refractive surgery  ~ 2010    right  . Irrigation and debridement abscess  08/13/2011   Procedure: MINOR INCISION AND DRAINAGE OF ABSCESS;  Surgeon: Gwenyth Ober, MD;  Location: [place];  Service: General;  Laterality: Right;  Right Breast   . Irrigation and debridement abscess  11/17/2011    Procedure: IRRIGATION AND DEBRIDEMENT ABSCESS;  Surgeon: Imogene Burn. Georgette Dover, MD;  Location: [place] [facility];  Service: General;  Laterality: Right;  irrigation and debridement right recurrent breast abscess  . Breast surgery Right     I&D for multiple abscesses  . Eye surgery Left     laser surgery  . Larynx surgery    . Ployp removed from voice box 03/30/12    . Incision and drainage abscess Right 05/09/2012    Procedure: INCISION AND DRAINAGE RIGHT BREAST ABSCESS;  Surgeon: Imogene Burn. Georgette Dover, MD;  Location: [place];  Service: General;  Laterality: Right;  . Nm myoview ltd  01/26/2009    Normal study, no evidence of ischemia, EF 67%  . Cutting balloon    . Cardiac catheterization  05/2012    see medical Hx.  . Coronary stent placement  11/08/2013    LAD   DES        dr harding   . Left heart catheterization with coronary angiogram N/A 05/29/2012  Procedure: LEFT HEART CATHETERIZATION WITH CORONARY ANGIOGRAM;  Surgeon: Troy Sine, MD;  Location: [facility] CATH LAB;  Service: Cardiovascular;  Laterality: N/A;  . Left heart catheterization with coronary angiogram N/A 11/08/2013    Procedure: LEFT HEART CATHETERIZATION WITH CORONARY ANGIOGRAM;  Surgeon: Leonie Man, MD;  Location: [facility] CATH LAB;  Service: Cardiovascular;  Laterality: N/A;  . Incision and drainage abscess Right 02/08/2014    Procedure: INCISION AND DRAINAGE RIGHT BREAST ABSCESS;  Surgeon: Donnie Mesa, MD;  Location: [place];  Service: General;  Laterality: Right;  . Incision and drainage abscess Right 06/14/2014    Procedure: INCISION AND DRAINAGE RIGHT BREAST ABSCESS;  Surgeon: Donnie Mesa, MD;  Location: [place];  Service: General;  Laterality: Right;   Family History  Problem Relation Age of Onset  . COPD Mother   . Cancer Mother      lymphoma  . Heart disease Mother   . Cancer Maternal Aunt     breast, colon  . Hyperlipidemia Father   . Heart disease Father   . Hypertension Brother   . Hypertension Maternal Grandmother   . Diabetes Maternal Grandmother    Social History  Substance Use Topics  . Smoking status: Current Every Day Smoker -- 0.25 packs/day for 30 years    Types: Cigarettes  . Smokeless tobacco: Never Used     Comment: 08/14/11 "quit for 3 wk 05/2011; was smoking 1 ppd since age 53 before I quit; at least I've cut down to 1/4 ppd"  . 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  Respiratory: Negative for shortness of breath and wheezing.   Cardiovascular: Negative for chest pain.  Musculoskeletal: Negative for back pain.  All other systems reviewed and are negative.     Allergies  Amoxicillin  Home Medications   Prior to Admission medications   Medication Sig Start Date End Date Taking? Authorizing Provider  acyclovir (ZOVIRAX) 400 MG tablet Take 400 mg by mouth daily.  11/09/13   Historical Provider, MD  aspirin EC 81 MG tablet Take 81 mg by mouth daily.    Historical Provider, MD  canagliflozin (INVOKANA) 100 MG TABS tablet TAKE 1 TABLET BY MOUTH EVERY MORNING Patient not taking: Reported on 10/10/2014 06/12/14   Philemon Kingdom, MD  carvedilol (COREG) 6.25 MG tablet TAKE 1 TABLET BY MOUTH TWICE DAILY WITH A MEAL. 11/07/13   Leonie Man, MD  ciprofloxacin (CIPRO) 500 MG tablet Take 1 tablet (500 mg total) by mouth 2 (two) times daily. 10 day course Patient not taking: Reported on 10/10/2014 06/15/14   Erby Pian, NP  clindamycin (CLEOCIN-T) 1 % lotion Apply topically 2 (two) times daily. 10/10/14   Shelly Bombard, MD  clopidogrel (PLAVIX) 75 MG tablet TAKE 1 TABLET BY MOUTH EVERY  DAY. 12/17/13   Leonie Man, MD  Cyanocobalamin (B-12 PO) Take 1 tablet by mouth daily.     Historical Provider, MD  fluconazole (DIFLUCAN) 150 MG tablet Take 1 tablet (150 mg total) by mouth as needed (for yeast infections caused by Invokana). Patient not taking: Reported on 10/10/2014 06/15/14   Erby Pian, NP  fluconazole (DIFLUCAN) 150 MG tablet TAKE 1 TABLET BY MOUTH ONCE 10/11/14  Shelly Bombard, MD  fluticasone [facility]) 50 MCG/ACT nasal spray Place 1 spray into both nostrils daily as needed for allergies.  12/11/13   Historical Provider, MD  gabapentin (NEURONTIN) 300 MG capsule Take 600 mg by mouth 2 (two) times daily.     Historical Provider, MD  insulin aspart (NOVOLOG) 100 UNIT/ML injection Inject 15 Units into the skin 3 (three) times daily before meals.    Historical Provider, MD  Insulin Syringe-Needle U-100 (INSULIN SYRINGE 1CC/31GX5/16") 31G X 5/16" 1 ML MISC USE AS DIRECTED FOUR TIMES DAILY FOR INSULIN INJECTIONS 01/10/14   Philemon Kingdom, MD  isosorbide mononitrate (IMDUR) 30 MG 24 hr tablet Take 0.5 tablets (15 mg total) by mouth daily. 11/29/13   Brittainy Erie Noe, PA-C  LEVEMIR 100 UNIT/ML injection INJECT 0.35 MLS INTO THE SKIN AT BEDTIME 01/13/14   Philemon Kingdom, MD  lisinopril (PRINIVIL,ZESTRIL) 10 MG tablet Take 5 mg by mouth daily.     Historical Provider, MD  metFORMIN (GLUCOPHAGE-XR) 500 MG 24 hr tablet Take 1 tablet (500 mg total) by mouth daily with breakfast. 11/11/13   Eileen Stanford, PA-C  metroNIDAZOLE (FLAGYL) 500 MG tablet Take 1 tablet (500 mg total) by mouth 2 (two) times daily. 10/14/14   Shelly Bombard, MD  nitroGLYCERIN (NITROSTAT) 0.4 MG SL tablet Place 1 tablet (0.4 mg total) under the tongue every 5 (five) minutes as needed for chest pain. 11/09/13   Eileen Stanford, PA-C  NOVOLOG 100 UNIT/ML injection INJECT 15 UNITS UNDER THE SKIN THREE TIMES DAILY BEFORE MEALS 10/14/14   Philemon Kingdom, MD  omeprazole (PRILOSEC) 20 MG capsule Take 20 mg  by mouth daily.    Historical Provider, MD  Oxycodone HCl 10 MG TABS Take 1 tablet (10 mg total) by mouth every 6 (six) hours as needed. 10/10/14   Shelly Bombard, MD  simvastatin (ZOCOR) 40 MG tablet Take 1 tablet (40 mg total) by mouth daily at 6 PM. 11/09/13   Eileen Stanford, PA-C  traZODone (DESYREL) 100 MG tablet Take 50 mg by mouth at bedtime as needed for sleep.     Historical Provider, MD  triamcinolone cream (KENALOG) 0.1 % Apply 1 application topically as needed (eczema).  08/22/13   Historical Provider, MD   BP 146/74 mmHg  Pulse 90  Temp(Src) 98.4 F (36.9 C) (Oral)  Resp 18  SpO2 100%  LMP 06/12/2014 Physical Exam  Constitutional: She is oriented to person, place, and time. She appears well-developed and well-nourished. No distress.  HENT:  Head: Normocephalic.  Eyes: Pupils are equal, round, and reactive to light.  Neck: Normal range of motion.  Cardiovascular: Normal rate.   Pulmonary/Chest: Effort normal. No respiratory distress. She has no wheezes. She has no rales.  Abdominal: Soft. She exhibits no distension. There is no tenderness.  Musculoskeletal: Normal range of motion.  Trace bilateral ankle edema  Neurological: She is alert and oriented to person, place, and time. No cranial nerve deficit. She exhibits normal muscle tone. Coordination normal.  Skin: Skin is warm and dry. She is not diaphoretic. No erythema.  Psychiatric: Her behavior is normal. Thought content normal.  Nursing note and vitals reviewed.   ED Course  Procedures (including critical care time) Labs Review Labs Reviewed  BASIC METABOLIC PANEL - Abnormal; Notable for the following:    Glucose, Bld 145 (*)    All other components within normal limits  CBC    EKG Interpretation   Date/Time:  Wednesday October 30 2014 16:10:45  EDT Ventricular Rate:  79 PR Interval:  176 QRS Duration: 88 QT Interval:  373 QTC Calculation: 428 R Axis:   61 Text Interpretation:  Sinus rhythm  Anteroseptal infarct, old Otherwise  normal ECG no significant change since 2015 Confirmed by GOLDSTON  MD,  SCOTT ([id number]) on 10/30/2014 4:21:33 PM      MDM   Patient presents emergency department today with bilateral foot and hand pain. She describes as burning, itching, sharp shooting pain. She has a history of diabetes and is on gabapentin. Patient also endorses trace bilateral swelling in her ankles that is relieved whenever she elevates her legs at night. She denies any chest pain or shortness of breath. She is well-appearing without signs of DVT or CHF exacerbation. No history of DVT. More likely this is diabetic neuropathy and possible venous stasis. Patient was given compression hose and encouraged to follow up with her doctor regarding her neuropathic pain. Patient in agreement with plan and discharged home in good health.   Final diagnoses:  Diabetic peripheral neuropathy ([place])  Fibromyalgia  Venous stasis    Roberto Scales, MD 10/30/14 [place], MD 11/09/14 [phone]

## 2014-10-30 NOTE — ED Notes (Signed)
Pt states that she has been having pain in both lower extremities, particularly when standing. Pt states that her feet are swollen from normal size. Pt states the pain has been worse since Sunday (10/27/2014). Pt states she has DM, and been dx with diabetic neuropathy. Pt states pain worse at night. Pt states ibuprofen has not worked for the pain.

## 2014-10-30 NOTE — Discharge Instructions (Signed)

## 2014-11-05 ENCOUNTER — Other Ambulatory Visit: Payer: Self-pay | Admitting: Obstetrics

## 2014-11-29 ENCOUNTER — Ambulatory Visit (INDEPENDENT_AMBULATORY_CARE_PROVIDER_SITE_OTHER): Payer: Medicaid Other | Admitting: Internal Medicine

## 2014-11-29 ENCOUNTER — Encounter: Payer: Self-pay | Admitting: Internal Medicine

## 2014-11-29 VITALS — BP 140/80 | HR 87 | Temp 98.2°F | Wt 251.0 lb

## 2014-11-29 DIAGNOSIS — Z23 Encounter for immunization: Secondary | ICD-10-CM

## 2014-11-29 DIAGNOSIS — E1121 Type 2 diabetes mellitus with diabetic nephropathy: Secondary | ICD-10-CM | POA: Diagnosis not present

## 2014-11-29 LAB — LIPID PANEL
CHOL/HDL RATIO: 4
Cholesterol: 171 mg/dL (ref 0–200)
HDL: 47.9 mg/dL (ref >39.00)
LDL Cholesterol: 99 mg/dL (ref 0–99)
NONHDL: 122.68
Triglycerides: 119 mg/dL (ref 0.0–149.0)
VLDL: 23.8 mg/dL (ref 0.0–40.0)

## 2014-11-29 LAB — HEPATIC FUNCTION PANEL
ALK PHOS: 79 U/L (ref 39–117)
ALT: 13 U/L (ref 0–35)
AST: 17 U/L (ref 0–37)
Albumin: 4.3 g/dL (ref 3.5–5.2)
Bilirubin, Direct: 0.1 mg/dL (ref 0.0–0.3)
TOTAL PROTEIN: 7.3 g/dL (ref 6.0–8.3)
Total Bilirubin: 0.5 mg/dL (ref 0.2–1.2)

## 2014-11-29 LAB — HEMOGLOBIN A1C: HEMOGLOBIN A1C: 6.9 % — AB (ref 4.6–6.5)

## 2014-11-29 MED ORDER — ALBIGLUTIDE 30 MG ~~LOC~~ PEN: PEN_INJECTOR | SUBCUTANEOUS | Status: DC

## 2014-11-29 NOTE — Progress Notes (Signed)
Patient ID: Cassidy [name], female   DOB: [birth year], 49 y.o.   MRN: [id number]  HPI: Cassidy [name] is a 49 y.o.-year-old female, initially referred by her PCP, Dellie Catholic, for management of DM2, dx 2005, insulin-dependent since 05/2012, uncontrolled, with complications (CAD, peripheral neuropathy). Last visit 13 mo ago! Previous appt was 8 mo after the previous!  Last hemoglobin A1c was: Lab Results  Component Value Date   HGBA1C 6.5* 02/08/2014   HGBA1C 7.5* 10/11/2013   HGBA1C 6.7* 03/09/2013   Pt is on a regimen of: - Levemir 35 units in hs  - Novolog 15 units tid ac - NovoLog SSI: - 150-175: + 1 unit  - 176-200: + 2 units  - 201-225: + 3 units  - 226-250: + 4 units  - >250: + 5 units - Metformin XR 500 mg in am >> nausea, diarrhea  We added Invokana 100 mg 10/2014 >> pt did not return for a visit after this as advised. She had severe yeast infections >> had to stop. Was on Metformin >> diarrhea.  She finished the [place] and could not get it from the pharmacy yet... Unclear if she can afford.  Pt checks her sugars 3 a day and they are higher: - am: 102-131 >> 98-129 (highest 140 x 1)  >> 134-179, 201, 204 >> 150-190s, 220 - 2h after b'fast: 79-139 >> n/c - before lunch: 111-167 >> 88, 92, 98, 146 >> 120-183, 231 >> 140-170 - 2h after lunch: not checking - before dinner: 103-126 >> 102-113 >> 148-149, 181 >> 150-180s - 2h after dinner:120-212 >> 124, 139  >> n/c - bedtime:147-223 >>  171, 178, 202, 274 x 1 >> 150, 181-213 >> 220s - nighttime: not checking AccuChek.  No lows. Lowest sugar was 120 >> 83 (pm); she has hypoglycemia awareness at 90s. Highest sugar was 231 >> 258.  Pt's meals are - per last visit's review: - Breakfast: glass of skim milk, [place] sausage (not bacon anymore), grits - 1 cup, toast - Lunch: salads and soups ([employer]) - Dinner: pinto beans, casseroles, [place] and chicken meat; spaghetti Walks after dinner. - Snacks: baby  carrots, jello  - no CKD, last BUN/creatinine:  Lab Results  Component Value Date   BUN 9 10/30/2014   CREATININE 0.82 10/30/2014  She is on Lisinopril 10.  - last set of lipids: Lab Results  Component Value Date   CHOL 187 10/11/2013   HDL 43.00 10/11/2013   LDLCALC 119* 10/11/2013   TRIG 125.0 10/11/2013   CHOLHDL 4 10/11/2013  She is on Lipitor 20. She is on ASA 81. - last eye exam was in 09/2012. 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 Was seeing Dr Ricki Miller >> retired.  - + numbness and tingling in her feet >> was on Neurontin 600 mg bid ([facility] would not approve more) >> on Neurontin 300 bid + Lyrica 75 bid.   I reviewed her chart and she also has a history of bipolar disorder, HL, GERD, back pain.  ROS: Constitutional: + weight gain, + fatigue, + hot flashes, + insomnia Eyes: + blurry vision, no xerophthalmia  ENT: no sore throat, no nodules palpated in throat, no dysphagia/odynophagia, no hoarseness Cardiovascular:no CP/no SOB/no palpitations/improved leg swelling since started HCTZ Respiratory:no cough/SOB/wheezing Gastrointestinal: no N/V/DC Musculoskeletal: no muscle/+ joint aches Skin: no rashes, + easy bruising Neurological: no tremors/numbness/tingling/dizziness  I reviewed pt's medications, allergies, PMH, social hx, family hx, and changes were documented in the history of present  illness. Otherwise, unchanged from my initial visit note.  PE: BP 140/80 mmHg  Pulse 87  Temp(Src) 98.2 F (36.8 C) (Oral)  Wt 251 lb (113.853 kg) Body mass index is 39.3 kg/(m^2). Wt Readings from Last 3 Encounters:  11/29/14 251 lb (113.853 kg)  10/10/14 249 lb (112.946 kg)  07/04/14 245 lb (111.131 kg)   Constitutional: overweight, in NAD Eyes: PERRLA, EOMI, no exophthalmos ENT: moist mucous membranes, no thyromegaly, no cervical lymphadenopathy Cardiovascular: RRR, No MRG Respiratory: CTA B Gastrointestinal: abdomen soft, NT, ND, BS+ Musculoskeletal: no deformities,  strength intact in all 4 Skin: moist, warm, no rashes  ASSESSMENT: 1. DM2, insulin-dependent, uncontrolled, with complications - CAD - s/p AMI 05/2012 Had normal lower arterial duplex study 08/03/2012. - peripheral neuropathy (on Neurontin)  PLAN:  1. Patient with long-standing, controlled returning after a long absence. She is noncompliant with the appts. Sugars are worse >> will add Tanzeum and also move Metformin at dinnertime as she has GI SEs if she takes it in am. Ideally, we would increase Metformin dose, if she can tolerate this. - I suggested to:  Patient Instructions  Please continue: - Levemir 35 units at bedtime - Novolog 15 units 3x a day - NovoLog SSI:  - 150-175: + 1 unit  - 176-200: + 2 units  - 201-225: + 3 units  - 226-250: + 4 units  - >250: + 5 units  Continue Metformin XR but move it to dinnertime.  Please add Tanzeum 30 mg under skin once a week. Please let me know about the sugars in 3 weeks to see if we need to increase this dose.  Please stop at the lab.  Please return in 1.5 month with your sugar log.   - continue checking CBGs 3x times a day, rotating checks - given new sugar logs - due for yearly eye exams - check A1c today, also LFTs and Lipids - will give her the flu vaccine today - Return to clinic in 1.5 mo with sugar log   Office Visit on 11/29/2014  Component Date Value Ref Range Status  . Total Bilirubin 11/29/2014 0.5  0.2 - 1.2 mg/dL Final  . Bilirubin, Direct 11/29/2014 0.1  0.0 - 0.3 mg/dL Final  . Alkaline Phosphatase 11/29/2014 79  39 - 117 U/L Final  . AST 11/29/2014 17  0 - 37 U/L Final  . ALT 11/29/2014 13  0 - 35 U/L Final  . Total Protein 11/29/2014 7.3  6.0 - 8.3 g/dL Final  . Albumin 11/29/2014 4.3  3.5 - 5.2 g/dL Final  . Hgb A1c MFr Bld 11/29/2014 6.9* 4.6 - 6.5 % Final   Glycemic Control Guidelines for People with Diabetes:Non Diabetic:  <6%Goal of Therapy: <7%Additional Action Suggested:  >8%   . Cholesterol  11/29/2014 171  0 - 200 mg/dL Final   ATP III Classification       Desirable:  < 200 mg/dL               Borderline High:  200 - 239 mg/dL          High:  > = 240 mg/dL  . Triglycerides 11/29/2014 119.0  0.0 - 149.0 mg/dL Final   Normal:  <150 mg/dLBorderline High:  150 - 199 mg/dL  . HDL 11/29/2014 47.90  >39.00 mg/dL Final  . VLDL 11/29/2014 23.8  0.0 - 40.0 mg/dL Final  . LDL Cholesterol 11/29/2014 99  0 - 99 mg/dL Final  . Total CHOL/HDL Ratio 11/29/2014 4  Final                  Men          Women1/2 Average Risk     3.4          3.3Average Risk          5.0          4.42X Average Risk          9.6          7.13X Average Risk          15.0          11.0                      . NonHDL 11/29/2014 122.68   Final   NOTE:  Non-HDL goal should be 30 mg/dL higher than patient's LDL goal (i.e. LDL goal of < 70 mg/dL, would have non-HDL goal of < 100 mg/dL)   Lipids at goal. LFTs normal. HbA1c surprisingly low...., lower than expected from log.We may need a fructosamine at next visit.

## 2014-11-29 NOTE — Progress Notes (Signed)
Pre visit review using our clinic review tool, if applicable. No additional management support is needed unless otherwise documented below in the visit note. 

## 2014-11-29 NOTE — Patient Instructions (Addendum)
Please continue: - Levemir 35 units at bedtime - Novolog 15 units 3x a day - NovoLog SSI:  - 150-175: + 1 unit  - 176-200: + 2 units  - 201-225: + 3 units  - 226-250: + 4 units  - >250: + 5 units  Continue Metformin XR but move it to dinnertime.  Please add Tanzeum 30 mg under skin once a week. Please let me know about the sugars in 3 weeks to see if we need to increase this dose.  Please stop at the lab.  Please return in 1.5 month with your sugar log.

## 2014-12-09 ENCOUNTER — Encounter (HOSPITAL_COMMUNITY): Payer: Self-pay

## 2014-12-09 DIAGNOSIS — F3130 Bipolar disorder, current episode depressed, mild or moderate severity, unspecified: Secondary | ICD-10-CM | POA: Insufficient documentation

## 2014-12-09 DIAGNOSIS — R102 Pelvic and perineal pain: Secondary | ICD-10-CM | POA: Insufficient documentation

## 2014-12-09 DIAGNOSIS — Z7902 Long term (current) use of antithrombotics/antiplatelets: Secondary | ICD-10-CM | POA: Insufficient documentation

## 2014-12-09 DIAGNOSIS — I10 Essential (primary) hypertension: Secondary | ICD-10-CM | POA: Insufficient documentation

## 2014-12-09 DIAGNOSIS — Z8619 Personal history of other infectious and parasitic diseases: Secondary | ICD-10-CM | POA: Diagnosis not present

## 2014-12-09 DIAGNOSIS — M797 Fibromyalgia: Secondary | ICD-10-CM | POA: Insufficient documentation

## 2014-12-09 DIAGNOSIS — Z794 Long term (current) use of insulin: Secondary | ICD-10-CM | POA: Diagnosis not present

## 2014-12-09 DIAGNOSIS — Z79891 Long term (current) use of opiate analgesic: Secondary | ICD-10-CM | POA: Insufficient documentation

## 2014-12-09 DIAGNOSIS — I2510 Atherosclerotic heart disease of native coronary artery without angina pectoris: Secondary | ICD-10-CM | POA: Diagnosis not present

## 2014-12-09 DIAGNOSIS — Z880 Allergy status to penicillin: Secondary | ICD-10-CM | POA: Insufficient documentation

## 2014-12-09 DIAGNOSIS — E119 Type 2 diabetes mellitus without complications: Secondary | ICD-10-CM | POA: Diagnosis not present

## 2014-12-09 DIAGNOSIS — N390 Urinary tract infection, site not specified: Secondary | ICD-10-CM | POA: Diagnosis not present

## 2014-12-09 DIAGNOSIS — Z862 Personal history of diseases of the blood and blood-forming organs and certain disorders involving the immune mechanism: Secondary | ICD-10-CM | POA: Insufficient documentation

## 2014-12-09 DIAGNOSIS — K6289 Other specified diseases of anus and rectum: Secondary | ICD-10-CM | POA: Diagnosis not present

## 2014-12-09 DIAGNOSIS — R300 Dysuria: Secondary | ICD-10-CM | POA: Diagnosis present

## 2014-12-09 DIAGNOSIS — F17210 Nicotine dependence, cigarettes, uncomplicated: Secondary | ICD-10-CM | POA: Insufficient documentation

## 2014-12-09 DIAGNOSIS — Z7982 Long term (current) use of aspirin: Secondary | ICD-10-CM | POA: Diagnosis not present

## 2014-12-09 DIAGNOSIS — K219 Gastro-esophageal reflux disease without esophagitis: Secondary | ICD-10-CM | POA: Insufficient documentation

## 2014-12-09 DIAGNOSIS — Z792 Long term (current) use of antibiotics: Secondary | ICD-10-CM | POA: Insufficient documentation

## 2014-12-09 DIAGNOSIS — Z79899 Other long term (current) drug therapy: Secondary | ICD-10-CM | POA: Diagnosis not present

## 2014-12-09 DIAGNOSIS — E785 Hyperlipidemia, unspecified: Secondary | ICD-10-CM | POA: Diagnosis not present

## 2014-12-09 LAB — URINALYSIS, ROUTINE W REFLEX MICROSCOPIC
Bilirubin Urine: NEGATIVE
Glucose, UA: NEGATIVE mg/dL
Ketones, ur: NEGATIVE mg/dL
Nitrite: NEGATIVE
Protein, ur: NEGATIVE mg/dL
Specific Gravity, Urine: 1.009 (ref 1.005–1.030)
pH: 6 (ref 5.0–8.0)

## 2014-12-09 LAB — URINE MICROSCOPIC-ADD ON

## 2014-12-09 NOTE — ED Notes (Signed)
Pt having lower abd pain since yesterday. Having urinary frequency but only dribbles a little. Hasn't had a UTI in a long time but is already currently taking an antibiotic for a bacterial infection

## 2014-12-10 ENCOUNTER — Emergency Department (HOSPITAL_COMMUNITY)
Admission: EM | Admit: 2014-12-10 | Discharge: 2014-12-10 | Disposition: A | Discharge status: Home / Self Care | Payer: Medicaid Other | Attending: Emergency Medicine | Admitting: Emergency Medicine

## 2014-12-10 DIAGNOSIS — N390 Urinary tract infection, site not specified: Secondary | ICD-10-CM

## 2014-12-10 LAB — I-STAT CHEM 8, ED
BUN: 12 mg/dL (ref 6–20)
CALCIUM ION: 1.12 mmol/L (ref 1.12–1.23)
Chloride: 101 mmol/L (ref 101–111)
Creatinine, Ser: 0.7 mg/dL (ref 0.44–1.00)
Glucose, Bld: 110 mg/dL — ABNORMAL HIGH (ref 65–99)
HCT: 41 % (ref 36.0–46.0)
Hemoglobin: 13.9 g/dL (ref 12.0–15.0)
Potassium: 3.3 mmol/L — ABNORMAL LOW (ref 3.5–5.1)
SODIUM: 141 mmol/L (ref 135–145)
TCO2: 26 mmol/L (ref 0–100)

## 2014-12-10 LAB — CBG MONITORING, ED: Glucose-Capillary: 113 mg/dL — ABNORMAL HIGH (ref 65–99)

## 2014-12-10 MED ORDER — PHENAZOPYRIDINE HCL 100 MG PO TABS: 100.0000 mg | ORAL_TABLET | Freq: Three times a day (TID) | ORAL | Status: DC | PRN

## 2014-12-10 MED ORDER — LIDOCAINE HCL (PF) 1 % IJ SOLN
INTRAMUSCULAR | Status: AC
Start: 2014-12-10 — End: 2014-12-10
  Administered 2014-12-10: 5 mL
  Filled 2014-12-10: qty 5

## 2014-12-10 MED ORDER — CEFTRIAXONE SODIUM 1 G IJ SOLR
1.0000 g | Freq: Once | INTRAMUSCULAR | Status: AC
  Administered 2014-12-10: 1 g via INTRAMUSCULAR
  Filled 2014-12-10: qty 10

## 2014-12-10 MED ORDER — PHENAZOPYRIDINE HCL 100 MG PO TABS
100.0000 mg | ORAL_TABLET | Freq: Once | ORAL | Status: AC
  Administered 2014-12-10: 100 mg via ORAL
  Filled 2014-12-10: qty 1

## 2014-12-10 MED ORDER — OXYCODONE-ACETAMINOPHEN 5-325 MG PO TABS
1.0000 | ORAL_TABLET | Freq: Once | ORAL | Status: AC
  Administered 2014-12-10: 1 via ORAL
  Filled 2014-12-10: qty 1

## 2014-12-10 MED ORDER — CEPHALEXIN 500 MG PO CAPS: 500.0000 mg | ORAL_CAPSULE | Freq: Two times a day (BID) | ORAL | Status: DC

## 2014-12-10 NOTE — Discharge Instructions (Signed)

## 2014-12-10 NOTE — ED Provider Notes (Signed)
CSN: [id number]   Arrival date & time 12/09/14 1939  History  By signing my name below, I, Cassidy [name], attest that this documentation has been prepared under the direction and in the presence of Merryl Hacker, MD. Electronically Signed: Altamease [name], ED Scribe. 12/10/2014. 12:41 AM.  Chief Complaint  Patient presents with  . Urinary Tract Infection    HPI The history is provided by the patient. No language interpreter was used.   Cassidy [name] is a 49 y.o. female with history of DM who presents to the Emergency Department complaining of new, burning, 9/10 lower abdominal pain with onset yesterday. Associated symptoms include dysuria, lower back pain, sharp vaginal pain, pressure at the rectum, and increased urinary frequency. Pt states that when she attempts to urinate she is only able to pass a few drops. She is currently on flagyl and clindamycin for a bacterial infection and a dental problem. Pt denies fever. She is not concerned for STD.   Past Medical History  Diagnosis Date  . Hypertension   . GERD (gastroesophageal reflux disease)   . Breast abscess     a. right side.   Marland Kitchen HLD (hyperlipidemia)   . Type II diabetes mellitus ([place])   . Anemia   . Sickle cell trait ([place])   . Fibromyalgia   . Genital warts   . Diabetic peripheral neuropathy ([place])   . Depression   . Bipolar depression ([place])   . PTSD (post-traumatic stress disorder)   . Tooth caries   . Pain in limb     a. LE VENOUS DUPLEX, 02/05/2009 - no evidence of deep vein thrombosis, Baker's cyst  . CAD (coronary artery disease)     a.  s/p PCTA to dRCA and ostium of branch extending into the PLA vessel and PDA branch (05/2012)  b. [place] s/p DES to mLAD  . Tobacco abuse     Past Surgical History  Procedure Laterality Date  . Bladder surgery  1980    "TVT"  . Irrigation and debridement abscess  12/19/2010    Procedure: IRRIGATION AND DEBRIDEMENT ABSCESS;  Surgeon: Rolm Bookbinder, MD;  Location: [place];   Service: General;  Laterality: Right;  . Incise and drain abcess  ; 04/26/11; 08/13/11    right breast  . Tonsillectomy  1988  . Refractive surgery  ~ 2010    right  . Irrigation and debridement abscess  08/13/2011    Procedure: MINOR INCISION AND DRAINAGE OF ABSCESS;  Surgeon: Gwenyth Ober, MD;  Location: [place];  Service: General;  Laterality: Right;  Right Breast   . Irrigation and debridement abscess  11/17/2011    Procedure: IRRIGATION AND DEBRIDEMENT ABSCESS;  Surgeon: Imogene Burn. Georgette Dover, MD;  Location: [place] [facility];  Service: General;  Laterality: Right;  irrigation and debridement right recurrent breast abscess  . Breast surgery Right     I&D for multiple abscesses  . Eye surgery Left     laser surgery  . Larynx surgery    . Ployp removed from voice box 03/30/12    . Incision and drainage abscess Right 05/09/2012    Procedure: INCISION AND DRAINAGE RIGHT BREAST ABSCESS;  Surgeon: Imogene Burn. Georgette Dover, MD;  Location: [place];  Service: General;  Laterality: Right;  . Nm myoview ltd  01/26/2009    Normal study, no evidence of ischemia, EF 67%  . Cutting balloon    . Cardiac catheterization  05/2012    see medical Hx.  . Coronary stent placement  11/08/2013    LAD   DES        dr Ellyn Hack   . Left heart catheterization with coronary angiogram N/A 05/29/2012    Procedure: LEFT HEART CATHETERIZATION WITH CORONARY ANGIOGRAM;  Surgeon: Troy Sine, MD;  Location[facility] C[facility] LAB;  Service: Cardiovascular;  Laterality: N/A;  . Left heart catheterization with coronary angiogram N/A 11/08/2013    Procedure: LEFT HEART CATHETERIZATION WITH CORONARY ANGIOGRAM;  Surgeon: Leonie Man, MD;  Location: [facility] CATH LAB;  Service: Cardiovascular;  Laterality: N/A;  . Incision and drainage abscess Right 02/08/2014    Procedure: INCISION AND DRAINAGE RIGHT BREAST ABSCESS;  Surgeon: Donnie Mesa, MD;  Location: [place];  Service: General;  Laterality: Right;  . Incision and drainage abscess Right 06/14/2014    Procedure:  INCISION AND DRAINAGE RIGHT BREAST ABSCESS;  Surgeon: Donnie Mesa, MD;  Location: [place];  Service: General;  Laterality: Right;    Family History  Problem Relation Age of Onset  . COPD Mother   . Cancer Mother     lymphoma  . Heart disease Mother   . Cancer Maternal Aunt     breast, colon  . Hyperlipidemia Father   . Heart disease Father   . Hypertension Brother   . Hypertension Maternal Grandmother   . Diabetes Maternal Grandmother     Social History  Substance Use Topics  . Smoking status: Current Every Day Smoker -- 0.25 packs/day for 30 years    Types: Cigarettes  . Smokeless tobacco: Never Used     Comment: 08/14/11 "quit for 3 wk 05/2011; was smoking 1 ppd since age 59 before I quit; at least I've cut down to 1/4 ppd"  . 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  Respiratory: Negative for chest tightness and shortness of breath.   Cardiovascular: Negative for chest pain.  Gastrointestinal: Positive for abdominal pain and rectal pain.  Genitourinary: Positive for dysuria, frequency and vaginal pain.  Musculoskeletal: Positive for back pain.  All other systems reviewed and are negative.  Home Medications   Prior to Admission medications   Medication Sig Start Date End Date Taking? Authorizing Provider  acyclovir (ZOVIRAX) 400 MG tablet Take 400 mg by mouth daily.  11/09/13   Historical Provider, MD  acyclovir (ZOVIRAX) 400 MG tablet TAKE 1 TABLET BY MOUTH THREE TIMES DAILY. 11/06/14   Shelly Bombard, MD  Albiglutide (TANZEUM) 30 MG PEN Inject under skin 30 mg once a week 11/29/14   Philemon Kingdom, MD  aspirin EC 81 MG tablet Take 81 mg by mouth daily.    Historical Provider, MD  canagliflozin (INVOKANA) 100 MG TABS tablet TAKE 1 TABLET BY MOUTH EVERY MORNING Patient not taking: Reported on 10/10/2014 06/12/14   Philemon Kingdom, MD  carvedilol (COREG)  6.25 MG tablet TAKE 1 TABLET BY MOUTH TWICE DAILY WITH A MEAL. 11/07/13   Leonie Man, MD  cephALEXin (KEFLEX) 500 MG capsule Take 1 capsule (500 mg total) by mouth 2 (two) times daily. 12/10/14   Merryl Hacker, MD  ciprofloxacin (CIPRO) 500 MG tablet Take 1 tablet (500 mg total) by mouth 2 (two) times daily. 10 day course 06/15/14   Erby Pian, NP  clindamycin (CLEOCIN-T) 1 % lotion Apply topically 2 (two) times daily. 10/10/14   Shelly Bombard, MD  clopidogrel (PLAVIX) 75 MG tablet TAKE 1 TABLET BY  MOUTH EVERY DAY. 12/17/13   Leonie Man, MD  Cyanocobalamin (B-12 PO) Take 1 tablet by mouth daily.     Historical Provider, MD  fluconazole (DIFLUCAN) 150 MG tablet Take 1 tablet (150 mg total) by mouth as needed (for yeast infections caused by Invokana). 06/15/14   Emina Riebock, NP  fluconazole (DIFLUCAN) 150 MG tablet TAKE 1 TABLET BY MOUTH ONCE 10/11/14   Shelly Bombard, MD  fluticasone [facility]) 50 MCG/ACT nasal spray Place 1 spray into both nostrils daily as needed for allergies.  12/11/13   Historical Provider, MD  gabapentin (NEURONTIN) 300 MG capsule Take 600 mg by mouth 2 (two) times daily.     Historical Provider, MD  insulin aspart (NOVOLOG) 100 UNIT/ML injection Inject 15 Units into the skin 3 (three) times daily before meals.    Historical Provider, MD  Insulin Syringe-Needle U-100 (INSULIN SYRINGE 1CC/31GX5/16") 31G X 5/16" 1 ML MISC USE AS DIRECTED FOUR TIMES DAILY FOR INSULIN INJECTIONS 01/10/14   Philemon Kingdom, MD  isosorbide mononitrate (IMDUR) 30 MG 24 hr tablet Take 0.5 tablets (15 mg total) by mouth daily. 11/29/13   Brittainy Erie Noe, PA-C  LEVEMIR 100 UNIT/ML injection INJECT 0.35 MLS INTO THE SKIN AT BEDTIME 01/13/14   Philemon Kingdom, MD  lisinopril (PRINIVIL,ZESTRIL) 10 MG tablet Take 5 mg by mouth daily.     Historical Provider, MD  metFORMIN (GLUCOPHAGE-XR) 500 MG 24 hr tablet Take 1 tablet (500 mg total) by mouth daily with breakfast. 11/11/13   Eileen Stanford, PA-C  metroNIDAZOLE (FLAGYL) 500 MG tablet Take 1 tablet (500 mg total) by mouth 2 (two) times daily. 10/14/14   Shelly Bombard, MD  nitroGLYCERIN (NITROSTAT) 0.4 MG SL tablet Place 1 tablet (0.4 mg total) under the tongue every 5 (five) minutes as needed for chest pain. 11/09/13   Eileen Stanford, PA-C  NOVOLOG 100 UNIT/ML injection INJECT 15 UNITS UNDER THE SKIN THREE TIMES DAILY BEFORE MEALS 10/14/14   Philemon Kingdom, MD  omeprazole (PRILOSEC) 20 MG capsule Take 20 mg by mouth daily.    Historical Provider, MD  Oxycodone HCl 10 MG TABS Take 1 tablet (10 mg total) by mouth every 6 (six) hours as needed. Patient not taking: Reported on 11/29/2014 10/10/14   Shelly Bombard, MD  phenazopyridine (PYRIDIUM) 100 MG tablet Take 1 tablet (100 mg total) by mouth 3 (three) times daily as needed for pain. 12/10/14   Merryl Hacker, MD  simvastatin (ZOCOR) 40 MG tablet Take 1 tablet (40 mg total) by mouth daily at 6 PM. 11/09/13   Eileen Stanford, PA-C  traZODone (DESYREL) 100 MG tablet Take 50 mg by mouth at bedtime as needed for sleep.     Historical Provider, MD  triamcinolone cream (KENALOG) 0.1 % Apply 1 application topically as needed (eczema).  08/22/13   Historical Provider, MD    Allergies  Amoxicillin  Triage Vitals: BP 139/85 mmHg  Pulse 89  Temp(Src) 98.9 F (37.2 C) (Oral)  Resp 20  SpO2 93%  Physical Exam  Constitutional: She is oriented to person, place, and time. She appears well-developed and well-nourished.  Overweight  HENT:  Head: Normocephalic and atraumatic.  Cardiovascular: Normal rate, regular rhythm and normal heart sounds.   No murmur heard. Pulmonary/Chest: Effort normal. No respiratory distress. She has no wheezes.  Abdominal: Soft. Bowel sounds are normal. There is tenderness. There is no rebound and no guarding.  Tenderness to palpation suprapubic region, no rebound or guarding, no  CVA tenderness  Neurological: She is alert and oriented to  person, place, and time.  Skin: Skin is warm and dry.  Psychiatric: She has a normal mood and affect.  Nursing note and vitals reviewed.   ED Course  Procedures   DIAGNOSTIC STUDIES: Oxygen Saturation is 93% on RA, adequate by my interpretation.    COORDINATION OF CARE: 12:37 AM Discussed treatment plan which includes lab work, pain management, and abx with pt at bedside and pt agreed to plan.  Labs Reviewed  URINALYSIS, ROUTINE W REFLEX MICROSCOPIC (NOT AT [facility]) - Abnormal; Notable for the following:    APPearance HAZY (*)    Hgb urine dipstick SMALL (*)    Leukocytes, UA MODERATE (*)    All other components within normal limits  URINE MICROSCOPIC-ADD ON - Abnormal; Notable for the following:    Squamous Epithelial / LPF 0-5 (*)    Bacteria, UA FEW (*)    All other components within normal limits  CBG MONITORING, ED - Abnormal; Notable for the following:    Glucose-Capillary 113 (*)    All other components within normal limits  I-STAT CHEM 8, ED - Abnormal; Notable for the following:    Potassium 3.3 (*)    Glucose, Bld 110 (*)    All other components within normal limits  URINE CULTURE    Imaging Review No results found.  I personally reviewed and evaluated these lab results as a part of my medical decision-making.   MDM   Final diagnoses:  UTI (lower urinary tract infection)    Patient presents with symptoms consistent with urinary tract infection. Nontoxic on exam. Afebrile. No CVA tenderness. Reports sugars may have been elevated at home and does have some low back pain that is nonlateralizing. Urinalysis with too numerous to count white cells. Urine culture sent and patient given IM Rocephin. Patient given Pyridium and Percocet for pain. Screening Chem-8 obtained and reassuring.  Because is 113. Discussed with patient addition of Keflex for urinary tract infection. Patient was given strict return precautions including fever, worsening or lateralizing back  pain.  After history, exam, and medical workup I feel the patient has been appropriately medically screened and is safe for discharge home. Pertinent diagnoses were discussed with the patient. Patient was given return precautions.  I personally performed the services described in this documentation, which was scribed in my presence. The recorded information has been reviewed and is accurate.    Merryl Hacker, MD 12/10/14 [phone]

## 2014-12-12 LAB — URINE CULTURE: Culture: >=100000

## 2014-12-13 ENCOUNTER — Telehealth (HOSPITAL_COMMUNITY): Payer: Self-pay

## 2014-12-13 NOTE — Telephone Encounter (Signed)
Post ED Visit - Positive Culture Follow-up  Culture report reviewed by antimicrobial stewardship pharmacist:  '[]'$  Elenor Quinones, Pharm.D. '[]'$  Heide Guile, Pharm.D., BCPS '[]'$  Parks Neptune, Pharm.D. '[]'$  Alycia Rossetti, Pharm.D., BCPS '[x]'$  Swift Bird, Pharm.D., BCPS, AAHIVP '[]'$  Legrand Como, Pharm.D., BCPS, AAHIVP '[]'$  Milus Glazier, Pharm.D. '[]'$  Rob Republic, [place].D.  Positive urine culture, >/= 100,000 colonies -> E Coli Treated with Cephalexin, organism sensitive to the same and no further patient follow-up is required at this time.  Dortha Kern 12/13/2014, 10:24 AM

## 2014-12-23 ENCOUNTER — Ambulatory Visit: Payer: Medicaid Other | Admitting: Obstetrics

## 2014-12-26 ENCOUNTER — Telehealth: Payer: Self-pay | Admitting: Internal Medicine

## 2014-12-26 MED ORDER — ALBIGLUTIDE 30 MG ~~LOC~~ PEN: PEN_INJECTOR | SUBCUTANEOUS | Status: DC

## 2014-12-26 NOTE — Telephone Encounter (Signed)
Refill sent to pt's pharmacy. 

## 2014-12-26 NOTE — Telephone Encounter (Signed)
Patient called stating that she would like a refill on her medication   Rx: Tanzeum   Pharmacy: [facility]  Thank you

## 2014-12-27 ENCOUNTER — Ambulatory Visit (INDEPENDENT_AMBULATORY_CARE_PROVIDER_SITE_OTHER): Payer: Medicaid Other | Admitting: Certified Nurse Midwife

## 2014-12-27 ENCOUNTER — Encounter: Payer: Self-pay | Admitting: Certified Nurse Midwife

## 2014-12-27 VITALS — BP 124/87 | HR 96 | Temp 97.9°F | Wt 248.0 lb

## 2014-12-27 DIAGNOSIS — R300 Dysuria: Secondary | ICD-10-CM | POA: Diagnosis not present

## 2014-12-27 DIAGNOSIS — B3731 Acute candidiasis of vulva and vagina: Secondary | ICD-10-CM

## 2014-12-27 DIAGNOSIS — B373 Candidiasis of vulva and vagina: Secondary | ICD-10-CM

## 2014-12-27 DIAGNOSIS — N898 Other specified noninflammatory disorders of vagina: Secondary | ICD-10-CM

## 2014-12-27 LAB — POCT URINALYSIS DIPSTICK
BILIRUBIN UA: NEGATIVE
Blood, UA: NEGATIVE
GLUCOSE UA: 250
Ketones, UA: NEGATIVE
LEUKOCYTES UA: NEGATIVE
NITRITE UA: NEGATIVE
Protein, UA: NEGATIVE
Spec Grav, UA: 1.015
UROBILINOGEN UA: NEGATIVE
pH, UA: 5

## 2014-12-27 MED ORDER — FLUCONAZOLE 200 MG PO TABS: 200.0000 mg | ORAL_TABLET | Freq: Once | ORAL | Status: DC

## 2014-12-27 MED ORDER — TERCONAZOLE 0.4 % VA CREA: 1.0000 | TOPICAL_CREAM | Freq: Every day | VAGINAL | Status: DC

## 2014-12-27 NOTE — Progress Notes (Signed)
Patient ID: Cassidy [name], female   DOB: 06-02-1965, 49 y.o.   MRN: [id number]   Chief Complaint  Patient presents with  . Vaginal Discharge    Vaginal Itching and burning when the urine passes over tissue    HPI Cassidy [name] is a 49 y.o. female.  Has been taking multiple antibiotics for infections over the course of the last few weeks.  Has been having vaginal itching and burning with urination for about a week.  Denies any frequency or urgency with urination.  Has hx of DM type 2 and uses insulin.   Discussed importance of good glucose control, has endocrinologist. States that she has frequent infections.   Has not tried anything for the vaginal itching.  Is currently sexually active and uses condoms.    HPI  Past Medical History  Diagnosis Date  . Hypertension   . GERD (gastroesophageal reflux disease)   . Breast abscess     a. right side.   Marland Kitchen HLD (hyperlipidemia)   . Type II diabetes mellitus ([place])   . Anemia   . Sickle cell trait ([place])   . Fibromyalgia   . Genital warts   . Diabetic peripheral neuropathy ([place])   . Depression   . Bipolar depression ([place])   . PTSD (post-traumatic stress disorder)   . Tooth caries   . Pain in limb     a. LE VENOUS DUPLEX, 02/05/2009 - no evidence of deep vein thrombosis, Baker's cyst  . CAD (coronary artery disease)     a.  s/p PCTA to dRCA and ostium of branch extending into the PLA vessel and PDA branch (05/2012)  b. [place] s/p DES to mLAD  . Tobacco abuse     Past Surgical History  Procedure Laterality Date  . Bladder surgery  1980    "TVT"  . Irrigation and debridement abscess  12/19/2010    Procedure: IRRIGATION AND DEBRIDEMENT ABSCESS;  Surgeon: Rolm Bookbinder, MD;  Location: [facility];  Service: General;  Laterality: Right;  . Incise and drain abcess  ; 04/26/11; 08/13/11    right breast  . Tonsillectomy  1988  . Refractive surgery  ~ 2010    right  . Irrigation and debridement abscess  08/13/2011    Procedure: MINOR INCISION AND  DRAINAGE OF ABSCESS;  Surgeon: Gwenyth Ober, MD;  Location: [facility];  Service: General;  Laterality: Right;  Right Breast   . Irrigation and debridement abscess  11/17/2011    Procedure: IRRIGATION AND DEBRIDEMENT ABSCESS;  Surgeon: Imogene Burn. Georgette Dover, MD;  Location: [facility];  Service: General;  Laterality: Right;  irrigation and debridement right recurrent breast abscess  . Breast surgery Right     I&D for multiple abscesses  . Eye surgery Left     laser surgery  . Larynx surgery    . Ployp removed from voice box 03/30/12    . Incision and drainage abscess Right 05/09/2012    Procedure: INCISION AND DRAINAGE RIGHT BREAST ABSCESS;  Surgeon: Imogene Burn. Georgette Dover, MD;  Location: [facility];  Service: General;  Laterality: Right;  . Nm myoview ltd  01/26/2009    Normal study, no evidence of ischemia, EF 67%  . Cutting balloon    . Cardiac catheterization  05/2012    see medical Hx.  . Coronary stent placement  11/08/2013    LAD   DES        dr harding   . Left heart catheterization with coronary angiogram N/A 05/29/2012  Procedure: LEFT HEART CATHETERIZATION WITH CORONARY ANGIOGRAM;  Surgeon: Troy Sine, MD;  Location[facility] C[facility] LAB;  Service: Cardiovascular;  Laterality: N/A;  . Left heart catheterization with coronary angiogram N/A 11/08/2013    Procedure: LEFT HEART CATHETERIZATION WITH CORONARY ANGIOGRAM;  Surgeon: Leonie Man, MD;  Location: [facility] C[facility] LAB;  Service: Cardiovascular;  Laterality: N/A;  . Incision and drainage abscess Right 02/08/2014    Procedure: INCISION AND DRAINAGE RIGHT BREAST ABSCESS;  Surgeon: Donnie Mesa, MD;  Location: [facility];  Service: General;  Laterality: Right;  . Incision and drainage abscess Right 06/14/2014    Procedure: INCISION AND DRAINAGE RIGHT BREAST ABSCESS;  Surgeon: Donnie Mesa, MD;  Location: [facility];  Service: General;  Laterality: Right;    Family History  Problem Relation Age of Onset  . COPD Mother   . Cancer Mother     lymphoma  . Heart disease Mother    . Cancer Maternal Aunt     breast, colon  . Hyperlipidemia Father   . Heart disease Father   . Hypertension Brother   . Hypertension Maternal Grandmother   . Diabetes Maternal Grandmother     Social History Social History  Substance Use Topics  . Smoking status: Current Every Day Smoker -- 0.25 packs/day for 30 years    Types: Cigarettes  . Smokeless tobacco: Never Used     Comment: 08/14/11 "quit for 3 wk 05/2011; was smoking 1 ppd since age 25 before I quit; at least I've cut down to 1/4 ppd"  . 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 Amoxicillin Hives    Current Outpatient Prescriptions  Medication Sig Dispense Refill  . acyclovir (ZOVIRAX) 400 MG tablet TAKE 1 TABLET BY MOUTH THREE TIMES DAILY. 30 tablet PRN  . Albiglutide (TANZEUM) 30 MG PEN Inject under skin 30 mg once a week 4 each 2  . aspirin EC 81 MG tablet Take 81 mg by mouth daily.    . carvedilol (COREG) 6.25 MG tablet TAKE 1 TABLET BY MOUTH TWICE DAILY WITH A MEAL. 60 tablet 11  . clindamycin (CLEOCIN-T) 1 % lotion Apply topically 2 (two) times daily. 60 mL 2  . clopidogrel (PLAVIX) 75 MG tablet TAKE 1 TABLET BY MOUTH EVERY DAY. 30 tablet 11  . fluticasone (FLONASE) 50 MCG/ACT nasal spray Place 1 spray into both nostrils daily as needed for allergies.   5  . gabapentin (NEURONTIN) 300 MG capsule Take 600 mg by mouth 2 (two) times daily.     . insulin aspart (NOVOLOG) 100 UNIT/ML injection Inject 15 Units into the skin 3 (three) times daily before meals.    . Insulin Syringe-Needle U-100 (INSULIN SYRINGE 1CC/31GX5/16") 31G X 5/16" 1 ML MISC USE AS DIRECTED FOUR TIMES DAILY FOR INSULIN INJECTIONS 150 each 11  . isosorbide mononitrate (IMDUR) 30 MG 24 hr tablet Take 0.5 tablets (15 mg total) by mouth daily. 30 tablet 6  . LEVEMIR 100 UNIT/ML injection INJECT 0.35 MLS INTO THE SKIN AT BEDTIME 20 mL 2  . lisinopril  (PRINIVIL,ZESTRIL) 10 MG tablet Take 5 mg by mouth daily.     . metFORMIN (GLUCOPHAGE-XR) 500 MG 24 hr tablet Take 1 tablet (500 mg total) by mouth daily with breakfast. 30 tablet 11  . NOVOLOG 100 UNIT/ML injection INJECT 15 UNITS UNDER THE SKIN THREE TIMES DAILY BEFORE MEALS 20 mL 0  . omeprazole (PRILOSEC) 20  MG capsule Take 20 mg by mouth daily.    . simvastatin (ZOCOR) 40 MG tablet Take 1 tablet (40 mg total) by mouth daily at 6 PM. 30 tablet 11  . traZODone (DESYREL) 100 MG tablet Take 50 mg by mouth at bedtime as needed for sleep.     Marland Kitchen triamcinolone cream (KENALOG) 0.1 % Apply 1 application topically as needed (eczema).   1  . Cyanocobalamin (B-12 PO) Take 1 tablet by mouth daily.     . fluconazole (DIFLUCAN) 200 MG tablet Take 1 tablet (200 mg total) by mouth once. Repeat in 48-72 hours. 3 tablet 4  . nitroGLYCERIN (NITROSTAT) 0.4 MG SL tablet Place 1 tablet (0.4 mg total) under the tongue every 5 (five) minutes as needed for chest pain. (Patient not taking: Reported on 12/27/2014) 25 tablet 12  . terconazole (TERAZOL 7) 0.4 % vaginal cream Place 1 applicator vaginally at bedtime. 45 g 4   No current facility-administered medications for this visit.    Review of Systems Review of Systems Constitutional: negative for fatigue and weight loss Respiratory: negative for cough and wheezing Cardiovascular: negative for chest pain, fatigue and palpitations Gastrointestinal: negative for abdominal pain and change in bowel habits Genitourinary: + vaginal itching and burning with itching Integument/breast: negative for nipple discharge Musculoskeletal:negative for myalgias Neurological: negative for gait problems and tremors Behavioral/Psych: negative for abusive relationship, depression Endocrine: negative for temperature intolerance     Blood pressure 124/87, pulse 96, temperature 97.9 F (36.6 C), weight 248 lb (112.492 kg).  Physical Exam Physical Exam General:   alert  Skin:   no  rash or abnormalities  Lungs:   clear to auscultation bilaterally  Heart:   regular rate and rhythm, S1, S2 normal, no murmur, click, rub or gallop  Breasts:   deferred  Abdomen:  normal findings: no organomegaly, soft, non-tender and no hernia obese  Pelvis:  External genitalia: normal general appearance Urinary system: urethral meatus normal and bladder without fullness, nontender Vaginal: normal without tenderness, induration or masses. + white chunky discharge Cervix: deferred Adnexa: deferred Uterus: deferred    50% of 15 min visit spent on counseling and coordination of care.   Data Reviewed Previous medical hx, meds, labs  Assessment     Vaginitis     Plan    Orders Placed This Encounter  Procedures  . SureSwab, Vaginosis/Vaginitis Plus  . POCT urinalysis dipstick   Meds ordered this encounter  Medications  . fluconazole (DIFLUCAN) 200 MG tablet    Sig: Take 1 tablet (200 mg total) by mouth once. Repeat in 48-72 hours.    Dispense:  3 tablet    Refill:  4  . terconazole (TERAZOL 7) 0.4 % vaginal cream    Sig: Place 1 applicator vaginally at bedtime.    Dispense:  45 g    Refill:  4     Follow up as needed.

## 2015-01-01 LAB — SURESWAB, VAGINOSIS/VAGINITIS PLUS
ATOPOBIUM VAGINAE: NOT DETECTED Log (cells/mL)
C. albicans, DNA: NOT DETECTED
C. glabrata, DNA: DETECTED — AB
C. parapsilosis, DNA: NOT DETECTED
C. trachomatis RNA, TMA: NOT DETECTED
C. tropicalis, DNA: NOT DETECTED
GARDNERELLA VAGINALIS: NOT DETECTED Log (cells/mL)
LACTOBACILLUS SPECIES: NOT DETECTED Log (cells/mL)
MEGASPHAERA SPECIES: NOT DETECTED Log (cells/mL)
N. gonorrhoeae RNA, TMA: NOT DETECTED
T. VAGINALIS RNA, QL TMA: NOT DETECTED

## 2015-01-02 ENCOUNTER — Other Ambulatory Visit: Payer: Self-pay | Admitting: Certified Nurse Midwife

## 2015-01-17 ENCOUNTER — Other Ambulatory Visit: Payer: Self-pay | Admitting: Internal Medicine

## 2015-01-31 ENCOUNTER — Other Ambulatory Visit: Payer: Self-pay | Admitting: Internal Medicine

## 2015-03-05 ENCOUNTER — Other Ambulatory Visit: Payer: Self-pay

## 2015-03-05 DIAGNOSIS — Z1231 Encounter for screening mammogram for malignant neoplasm of breast: Secondary | ICD-10-CM

## 2015-03-13 ENCOUNTER — Ambulatory Visit: Payer: Medicaid Other | Admitting: Internal Medicine

## 2015-03-19 ENCOUNTER — Ambulatory Visit: Payer: Medicaid Other | Admitting: Internal Medicine

## 2015-03-20 ENCOUNTER — Other Ambulatory Visit (INDEPENDENT_AMBULATORY_CARE_PROVIDER_SITE_OTHER): Payer: Medicaid Other | Admitting: *Deleted

## 2015-03-20 ENCOUNTER — Ambulatory Visit (INDEPENDENT_AMBULATORY_CARE_PROVIDER_SITE_OTHER): Payer: Medicaid Other | Admitting: Internal Medicine

## 2015-03-20 ENCOUNTER — Encounter: Payer: Self-pay | Admitting: Internal Medicine

## 2015-03-20 ENCOUNTER — Other Ambulatory Visit: Payer: Self-pay | Admitting: *Deleted

## 2015-03-20 VITALS — BP 118/70 | HR 100 | Temp 98.1°F | Resp 12 | Wt 252.8 lb

## 2015-03-20 DIAGNOSIS — E1121 Type 2 diabetes mellitus with diabetic nephropathy: Secondary | ICD-10-CM

## 2015-03-20 LAB — POCT GLYCOSYLATED HEMOGLOBIN (HGB A1C): HEMOGLOBIN A1C: 8.2

## 2015-03-20 MED ORDER — INSULIN ASPART 100 UNIT/ML FLEXPEN: 10.0000 [IU] | PEN_INJECTOR | Freq: Three times a day (TID) | SUBCUTANEOUS | Status: DC

## 2015-03-20 MED ORDER — INSULIN DETEMIR 100 UNIT/ML ~~LOC~~ SOLN: SUBCUTANEOUS | Status: DC

## 2015-03-20 MED ORDER — ALBIGLUTIDE 50 MG ~~LOC~~ PEN: PEN_INJECTOR | SUBCUTANEOUS | Status: DC

## 2015-03-20 MED ORDER — METFORMIN HCL ER 500 MG PO TB24: 500.0000 mg | ORAL_TABLET | Freq: Every day | ORAL | Status: DC

## 2015-03-20 MED ORDER — INSULIN DETEMIR 100 UNIT/ML FLEXPEN: 20.0000 [IU] | PEN_INJECTOR | Freq: Every day | SUBCUTANEOUS | Status: DC

## 2015-03-20 MED ORDER — INSULIN PEN NEEDLE 32G X 4 MM MISC: Status: DC

## 2015-03-20 NOTE — Patient Instructions (Addendum)
Please continue:  - Metformin XR 500 mg at dinnertime. - Levemir 20 units at bedtime - Novolog 10-12 units 3x a day - NovoLog SSI:  - 150-175: + 1 unit  - 176-200: + 2 units  - 201-225: + 3 units  - 226-250: + 4 units  - >250: + 5 units  Please increase: - Tanzeum to 50 mg under skin once a week  Please return in 1.5 month with your sugar log.

## 2015-03-20 NOTE — Progress Notes (Signed)
Patient ID: Cassidy [name], female   DOB: 07-29-1965, 50 y.o.   MRN: [id number] ,  HPI: Cassidy [name] is a 50 y.o.-year-old female, initially referred by her PCP, Cassidy [name], for management of DM2, dx 2005, insulin-dependent since 05/2012, uncontrolled, with complications (CAD, peripheral neuropathy). Last visit 4 mo ago. She lost Cassidy [name] for a period of time.  She was off NovoLog x 2 months >> restarted end of 01/2015. She are off Levemir for last 3 mo. She are off Metformin for last 3 mo. She continued on Tanzeum, only off x 1 week.  Last hemoglobin A1c was: Lab Results  Component Value Date   HGBA1C 6.9* 11/29/2014   HGBA1C 6.5* 02/08/2014   HGBA1C 7.5* 10/11/2013   Pt is on a regimen of: - Levemir 35 units in hs  - Novolog 15 units tid ac - NovoLog SSI: - 150-175: + 1 unit  - 176-200: + 2 units  - 201-225: + 3 units  - 226-250: + 4 units  - >250: + 5 units - Metformin XR 500 mg in am >> nausea, diarrhea  >> at dinnertime  - Tanzeum 30 mg weekly. We added Invokana 100 mg 10/2014 >> pt did not return for a visit after this as advised. She had severe yeast infections >> had to stop. Was on Metformin >> diarrhea.   Pt checks her sugars 3 a day and they are higher: - am: 102-131 >> 98-129 (highest 140 x 1)  >> 134-179, 201, 204 >> 150-190s, 220 >> 141-190s (prev. 100-112) - 2h after b'fast: 79-139 >> n/c - before lunch: 111-167 >> 88, 92, 98, 146 >> 120-183, 231 >> 140-170 >> 130-190,  - 2h after lunch: not checking - before dinner: 103-126 >> 102-113 >> 148-149, 181 >> 150-180s >> >> 130-235 - 2h after dinner:120-212 >> 124, 139  >> n/c - bedtime:147-223 >>  171, 178, 202, 274 x 1 >> 150, 181-213 >> 220s >> 300s,  But no recent checks - nighttime: not checking AccuChek.  No lows. Lowest sugar was 120 >> 83 (pm) >> 130; she has hypoglycemia awareness at 90s. Highest sugar was 231 >> 258 >> 300s.  Pt's meals are - per last visit's review: - Breakfast: glass of skim  milk, [place] sausage (not bacon anymore), grits - 1 cup, toast - Lunch: salads and soups ([employer]) - Dinner: pinto beans, casseroles, [place] and chicken meat; spaghetti Walks after dinner. - Snacks: baby carrots, jello  - no CKD, last BUN/creatinine:  Lab Results  Component Value Date   BUN 12 12/10/2014   CREATININE 0.70 12/10/2014  She is on Lisinopril 10.  - last set of lipids: Lab Results  Component Value Date   CHOL 171 11/29/2014   HDL 47.90 11/29/2014   LDLCALC 99 11/29/2014   TRIG 119.0 11/29/2014   CHOLHDL 4 11/29/2014  She is on Lipitor 20. She is on ASA 81. - last eye exam was in 09/2012. + Cassidy and had surgery in left eye. Was seeing Cassidy [name] >> retired.  - + numbness and tingling in her feet >> was on Neurontin 600 mg bid (Cassidy [name] would not approve more) >> on Neurontin 300 bid + Lyrica 75 bid.   I reviewed her chart and she also has a history of bipolar disorder, HL, GERD, back pain.  ROS: Constitutional: + weight gain, + fatigue, + hot flashes, + insomnia Eyes: no  blurry vision, no xerophthalmia  ENT: no sore throat, no nodules palpated in throat,  no dysphagia/odynophagia, no hoarseness Cardiovascular:no CP/no SOB/no palpitations/leg swelling Respiratory:no cough/SOB/wheezing Gastrointestinal: no N/V/DC Musculoskeletal: no muscle/joint aches Skin: no rashes Neurological: no tremors/numbness/tingling/dizziness  I reviewed pt's medications, allergies, PMH, social hx, family hx, and changes were documented in the history of present illness. Otherwise, unchanged from my initial visit note.  PE: BP 118/70 mmHg  Pulse 100  Temp(Src) 98.1 F (36.7 C) (Oral)  Resp 12  Wt 252 lb 12.8 oz (114.669 kg)  SpO2 96% Body mass index is 39.58 kg/(m^2). Wt Readings from Last 3 Encounters:  03/20/15 252 lb 12.8 oz (114.669 kg)  12/27/14 248 lb (112.492 kg)  11/29/14 251 lb (113.853 kg)   Constitutional: overweight, in NAD Eyes: PERRLA, EOMI, no  exophthalmos ENT: moist mucous membranes, no thyromegaly, no cervical lymphadenopathy Cardiovascular: RRR, No MRG Respiratory: CTA B Gastrointestinal: abdomen soft, NT, ND, BS+ Musculoskeletal: no deformities, strength intact in all 4 Skin: moist, warm, no rashes  ASSESSMENT: 1. DM2, insulin-dependent, uncontrolled, with complications - CAD - s/p AMI 05/2012 Had normal lower arterial duplex study 08/03/2012. - peripheral neuropathy (on Neurontin)  PLAN:  1. Patient with long-standing,  Uncontrolled diabetes, returning with  Still high sugars, as she was off her insulins and metformin due to lack of insurance. This problem has since resolved, and she was able to restart NovoLog.  Her sugars, per records in January, are mostly between 145 and 162 in the morning and they stay around his levels throughout the day. We will restart Levemir and metformin and increase Tanzeum:  Patient Instructions  Please continue:  - Metformin XR 500 mg at dinnertime. - Levemir 20 units at bedtime - Novolog 10-12 units 3x a day - NovoLog SSI:  - 150-175: + 1 unit  - 176-200: + 2 units  - 201-225: + 3 units  - 226-250: + 4 units  - >250: + 5 units  Please increase: - Tanzeum to 50 mg under skin once a week  Please return in 1.5 month with your sugar log.   - continue checking CBGs 3x times a day, rotating checks - given new sugar logs - due for yearly eye exams >>  Advised to schedule - given her the flu vaccine this season - checked HbA1c today >> 8.2% (higher) - Return to clinic in 1.5 mo with sugar log    Lipids at goal. LFTs normal. HbA1c surprisingly low...., lower than expected from log.We may need a fructosamine at next visit.

## 2015-03-24 ENCOUNTER — Ambulatory Visit: Payer: Medicaid Other

## 2015-03-24 ENCOUNTER — Ambulatory Visit
Admission: RE | Admit: 2015-03-24 | Discharge: 2015-03-24 | Disposition: A | Discharge status: Home / Self Care | Payer: Medicaid Other | Source: Ambulatory Visit

## 2015-03-24 DIAGNOSIS — Z1231 Encounter for screening mammogram for malignant neoplasm of breast: Secondary | ICD-10-CM

## 2015-03-27 ENCOUNTER — Encounter (HOSPITAL_COMMUNITY): Payer: Self-pay

## 2015-03-27 ENCOUNTER — Emergency Department (HOSPITAL_COMMUNITY)
Admission: EM | Admit: 2015-03-27 | Discharge: 2015-03-27 | Disposition: A | Discharge status: Home / Self Care | Payer: Medicaid Other | Attending: Emergency Medicine | Admitting: Emergency Medicine

## 2015-03-27 ENCOUNTER — Emergency Department (HOSPITAL_COMMUNITY): Payer: Medicaid Other

## 2015-03-27 DIAGNOSIS — M797 Fibromyalgia: Secondary | ICD-10-CM | POA: Diagnosis not present

## 2015-03-27 DIAGNOSIS — E119 Type 2 diabetes mellitus without complications: Secondary | ICD-10-CM | POA: Diagnosis not present

## 2015-03-27 DIAGNOSIS — Z79899 Other long term (current) drug therapy: Secondary | ICD-10-CM | POA: Diagnosis not present

## 2015-03-27 DIAGNOSIS — Z7982 Long term (current) use of aspirin: Secondary | ICD-10-CM | POA: Insufficient documentation

## 2015-03-27 DIAGNOSIS — E785 Hyperlipidemia, unspecified: Secondary | ICD-10-CM | POA: Diagnosis not present

## 2015-03-27 DIAGNOSIS — D649 Anemia, unspecified: Secondary | ICD-10-CM | POA: Diagnosis not present

## 2015-03-27 DIAGNOSIS — R0602 Shortness of breath: Secondary | ICD-10-CM

## 2015-03-27 DIAGNOSIS — Z7984 Long term (current) use of oral hypoglycemic drugs: Secondary | ICD-10-CM | POA: Insufficient documentation

## 2015-03-27 DIAGNOSIS — R1110 Vomiting, unspecified: Secondary | ICD-10-CM | POA: Insufficient documentation

## 2015-03-27 DIAGNOSIS — I10 Essential (primary) hypertension: Secondary | ICD-10-CM | POA: Diagnosis not present

## 2015-03-27 DIAGNOSIS — Z880 Allergy status to penicillin: Secondary | ICD-10-CM | POA: Diagnosis not present

## 2015-03-27 DIAGNOSIS — K219 Gastro-esophageal reflux disease without esophagitis: Secondary | ICD-10-CM | POA: Insufficient documentation

## 2015-03-27 DIAGNOSIS — J069 Acute upper respiratory infection, unspecified: Secondary | ICD-10-CM | POA: Insufficient documentation

## 2015-03-27 DIAGNOSIS — Z9861 Coronary angioplasty status: Secondary | ICD-10-CM | POA: Diagnosis not present

## 2015-03-27 DIAGNOSIS — Z794 Long term (current) use of insulin: Secondary | ICD-10-CM | POA: Diagnosis not present

## 2015-03-27 DIAGNOSIS — F17210 Nicotine dependence, cigarettes, uncomplicated: Secondary | ICD-10-CM | POA: Insufficient documentation

## 2015-03-27 DIAGNOSIS — R079 Chest pain, unspecified: Secondary | ICD-10-CM | POA: Insufficient documentation

## 2015-03-27 DIAGNOSIS — Z8619 Personal history of other infectious and parasitic diseases: Secondary | ICD-10-CM | POA: Insufficient documentation

## 2015-03-27 DIAGNOSIS — I2510 Atherosclerotic heart disease of native coronary artery without angina pectoris: Secondary | ICD-10-CM | POA: Diagnosis not present

## 2015-03-27 DIAGNOSIS — F3130 Bipolar disorder, current episode depressed, mild or moderate severity, unspecified: Secondary | ICD-10-CM | POA: Insufficient documentation

## 2015-03-27 DIAGNOSIS — R05 Cough: Secondary | ICD-10-CM | POA: Diagnosis present

## 2015-03-27 LAB — BASIC METABOLIC PANEL
ANION GAP: 11 (ref 5–15)
BUN: 8 mg/dL (ref 6–20)
CALCIUM: 8.9 mg/dL (ref 8.9–10.3)
CO2: 25 mmol/L (ref 22–32)
Chloride: 99 mmol/L — ABNORMAL LOW (ref 101–111)
Creatinine, Ser: 0.71 mg/dL (ref 0.44–1.00)
GLUCOSE: 200 mg/dL — AB (ref 65–99)
Potassium: 3.8 mmol/L (ref 3.5–5.1)
Sodium: 135 mmol/L (ref 135–145)

## 2015-03-27 LAB — I-STAT TROPONIN, ED: Troponin i, poc: 0 ng/mL (ref 0.00–0.08)

## 2015-03-27 LAB — CBC
HCT: 36.1 % (ref 36.0–46.0)
HEMOGLOBIN: 12 g/dL (ref 12.0–15.0)
MCH: 28.9 pg (ref 26.0–34.0)
MCHC: 33.2 g/dL (ref 30.0–36.0)
MCV: 87 fL (ref 78.0–100.0)
Platelets: 253 10*3/uL (ref 150–400)
RBC: 4.15 MIL/uL (ref 3.87–5.11)
RDW: 13.2 % (ref 11.5–15.5)
WBC: 5.5 10*3/uL (ref 4.0–10.5)

## 2015-03-27 LAB — PROTIME-INR
INR: 0.93 (ref 0.00–1.49)
PROTHROMBIN TIME: 12.7 s (ref 11.6–15.2)

## 2015-03-27 MED ORDER — GUAIFENESIN 200 MG PO TABS: 200.0000 mg | ORAL_TABLET | ORAL | Status: DC | PRN

## 2015-03-27 MED ORDER — ALBUTEROL SULFATE HFA 108 (90 BASE) MCG/ACT IN AERS: 1.0000 | INHALATION_SPRAY | Freq: Four times a day (QID) | RESPIRATORY_TRACT | Status: DC | PRN

## 2015-03-27 MED ORDER — ALBUTEROL SULFATE (2.5 MG/3ML) 0.083% IN NEBU
2.5000 mg | INHALATION_SOLUTION | Freq: Once | RESPIRATORY_TRACT | Status: AC
  Administered 2015-03-27: 2.5 mg via RESPIRATORY_TRACT
  Filled 2015-03-27: qty 3

## 2015-03-27 NOTE — ED Notes (Signed)
Pt stable, ambulatory, states understanding of discharge instructions 

## 2015-03-27 NOTE — ED Provider Notes (Signed)
I evaluated this patient with Armstead Peaks, PA-C.  Please see her note for further.  patient presented with 2 days of nasal congestion, runny nose, postnasal drip, coughing and shortness of breath. She does report some pain in her chest only with coughing. She denies chest pain otherwise. She also complains of some body aches.  chest x-ray is unremarkable. Labs as below. They're unremarkable. Patient with upper respiratory infection. Patient felt better after albuterol breathing treatment. Plan is for discharge with close follow-up by primary care.  Results for orders placed or performed during the hospital encounter of 13/24/40  Basic metabolic panel  Result Value Ref Range   Sodium 135 135 - 145 mmol/L   Potassium 3.8 3.5 - 5.1 mmol/L   Chloride 99 (L) 101 - 111 mmol/L   CO2 25 22 - 32 mmol/L   Glucose, Bld 200 (H) 65 - 99 mg/dL   BUN 8 6 - 20 mg/dL   Creatinine, Ser 0.71 0.44 - 1.00 mg/dL   Calcium 8.9 8.9 - 10.3 mg/dL   GFR calc non Af Amer >60 >60 mL/min   GFR calc Af Amer >60 >60 mL/min   Anion gap 11 5 - 15  CBC  Result Value Ref Range   WBC 5.5 4.0 - 10.5 K/uL   RBC 4.15 3.87 - 5.11 MIL/uL   Hemoglobin 12.0 12.0 - 15.0 g/dL   HCT 36.1 36.0 - 46.0 %   MCV 87.0 78.0 - 100.0 fL   MCH 28.9 26.0 - 34.0 pg   MCHC 33.2 30.0 - 36.0 g/dL   RDW 13.2 11.5 - 15.5 %   Platelets 253 150 - 400 K/uL  Protime-INR  Result Value Ref Range   Prothrombin Time 12.7 11.6 - 15.2 seconds   INR 0.93 0.00 - 1.49  I-stat troponin, ED  Result Value Ref Range   Troponin i, poc 0.00 0.00 - 0.08 ng/mL   Comment 3           Dg Chest 2 View  03/27/2015  CLINICAL DATA:  Productive cough and chest pain EXAM: CHEST  2 VIEW COMPARISON:  05/26/2012 FINDINGS: The heart size and mediastinal contours are within normal limits. Both lungs are clear. The visualized skeletal structures are unremarkable. IMPRESSION: No active cardiopulmonary disease. Electronically Signed   By: Inez Catalina M.D.   On: 03/27/2015 14:43    Mm Digital Screening Bilateral  03/26/2015  CLINICAL DATA:  Screening. EXAM: DIGITAL SCREENING BILATERAL MAMMOGRAM WITH CAD COMPARISON:  Previous exam(s). ACR Breast Density Category b: There are scattered areas of fibroglandular density. FINDINGS: There are no findings suspicious for malignancy. Images were processed with CAD. IMPRESSION: No mammographic evidence of malignancy. A result letter of this screening mammogram will be mailed directly to the patient. RECOMMENDATION: Screening mammogram in one year. (Code:SM-B-01Y) BI-RADS CATEGORY  1: Negative. Electronically Signed   By: Nolon Nations M.D.   On: 03/26/2015 12:25      Waynetta Pean, PA-C 03/28/15 0150  Dorie Rank, MD 03/28/15 1236

## 2015-03-27 NOTE — ED Provider Notes (Signed)
CSN: [id number]     Arrival date & time 03/27/15  1402 History   First MD Initiated Contact with Patient 03/27/15 1853     Chief Complaint  Patient presents with  . Cough  . Nasal Congestion     (Consider location/radiation/quality/duration/timing/severity/associated sxs/prior Treatment) HPI Comments: Patient is a 28F with a PMHx of CAD, HTN, and DM who presents today with cough, congestion, and shortness of breath for 2 days. Her cough is productive and she has seen yellow phlegm. Patient has not been able to produce very much, however. Patient reports rib pain and tenderness since the coughing began. Patient has reported shortness of breath at rest, worse on exertion. Patient felt dizzy this morning after coughing a lot. Patient had 2 episodes of vomiting after coughing and activating her gag reflex. Patient has tried Theraflu and cold/cough medicine. She denies fever, chest pain, abdominal pain, headache, or dysuria.   Patient is a 50 y.o. female presenting with cough. The history is provided by the patient.  Cough Associated symptoms: shortness of breath and sore throat   Associated symptoms: no chest pain, no chills, no ear pain, no fever, no headaches and no rash     Past Medical History  Diagnosis Date  . Hypertension   . GERD (gastroesophageal reflux disease)   . Breast abscess     a. right side.   Marland Kitchen HLD (hyperlipidemia)   . Type II diabetes mellitus ([place])   . Anemia   . Sickle cell trait ([place])   . Fibromyalgia   . Genital warts   . Diabetic peripheral neuropathy ([place])   . Depression   . Bipolar depression ([place])   . PTSD (post-traumatic stress disorder)   . Tooth caries   . Pain in limb     a. LE VENOUS DUPLEX, 02/05/2009 - no evidence of deep vein thrombosis, Baker's cyst  . CAD (coronary artery disease)     a.  s/p PCTA to dRCA and ostium of branch extending into the PLA vessel and PDA branch (05/2012)  b. [place] s/p DES to mLAD  . Tobacco abuse    Past Surgical History   Procedure Laterality Date  . Bladder surgery  1980    "TVT"  . Irrigation and debridement abscess  12/19/2010    Procedure: IRRIGATION AND DEBRIDEMENT ABSCESS;  Surgeon: Rolm Bookbinder, MD;  Location: [facility];  Service: General;  Laterality: Right;  . Incise and drain abcess  ; 04/26/11; 08/13/11    right breast  . Tonsillectomy  1988  . Refractive surgery  ~ 2010    right  . Irrigation and debridement abscess  08/13/2011    Procedure: MINOR INCISION AND DRAINAGE OF ABSCESS;  Surgeon: Gwenyth Ober, MD;  Location: [facility];  Service: General;  Laterality: Right;  Right Breast   . Irrigation and debridement abscess  11/17/2011    Procedure: IRRIGATION AND DEBRIDEMENT ABSCESS;  Surgeon: Imogene Burn. Georgette Dover, MD;  Location: [facility];  Service: General;  Laterality: Right;  irrigation and debridement right recurrent breast abscess  . Breast surgery Right     I&D for multiple abscesses  . Eye surgery Left     laser surgery  . Larynx surgery    . Ployp removed from voice box 03/30/12    . Incision and drainage abscess Right 05/09/2012    Procedure: INCISION AND DRAINAGE RIGHT BREAST ABSCESS;  Surgeon: Imogene Burn. Georgette Dover, MD;  Location: [facility];  Service: General;  Laterality: Right;  . Nm myoview  ltd  01/26/2009    Normal study, no evidence of ischemia, EF 67%  . Cutting balloon    . Cardiac catheterization  05/2012    see medical Hx.  . Coronary stent placement  11/08/2013    LAD   DES        dr harding   . Left heart catheterization with coronary angiogram N/A 05/29/2012    Procedure: LEFT HEART CATHETERIZATION WITH CORONARY ANGIOGRAM;  Surgeon: Troy Sine, MD;  Location: [facility] CATH LAB;  Service: Cardiovascular;  Laterality: N/A;  . Left heart catheterization with coronary angiogram N/A 11/08/2013    Procedure: LEFT HEART CATHETERIZATION WITH CORONARY ANGIOGRAM;  Surgeon: Leonie Man, MD;  Location: [facility] CATH LAB;  Service: Cardiovascular;  Laterality: N/A;  . Incision and drainage abscess Right 02/08/2014     Procedure: INCISION AND DRAINAGE RIGHT BREAST ABSCESS;  Surgeon: Donnie Mesa, MD;  Location: [facility];  Service: General;  Laterality: Right;  . Incision and drainage abscess Right 06/14/2014    Procedure: INCISION AND DRAINAGE RIGHT BREAST ABSCESS;  Surgeon: Donnie Mesa, MD;  Location: [facility];  Service: General;  Laterality: Right;   Family History  Problem Relation Age of Onset  . COPD Mother   . Cancer Mother     lymphoma  . Heart disease Mother   . Cancer Maternal Aunt     breast, colon  . Hyperlipidemia Father   . Heart disease Father   . Hypertension Brother   . Hypertension Maternal Grandmother   . Diabetes Maternal Grandmother    Social History  Substance Use Topics  . Smoking status: Current Every Day Smoker -- 0.25 packs/day for 30 years    Types: Cigarettes  . Smokeless tobacco: Never Used     Comment: 08/14/11 "quit for 3 wk 05/2011; was smoking 1 ppd since age 42 before I quit; at least I've cut down to 1/4 ppd"  . 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  HENT: Positive for sore throat. Negative for ear pain and facial swelling.   Respiratory: Positive for cough and shortness of breath.   Cardiovascular: Negative for chest pain.  Gastrointestinal: Positive for vomiting (1 episode after coughing). Negative for nausea and abdominal pain.  Genitourinary: Negative for dysuria.  Musculoskeletal: Negative for back pain.  Skin: Negative for rash and wound.  Neurological: Negative for headaches.  Psychiatric/Behavioral: The patient is not nervous/anxious.       Allergies  Amoxicillin  Home Medications   Prior to Admission medications   Medication Sig Start Date End Date Taking? Authorizing Provider  acyclovir (ZOVIRAX) 400 MG tablet TAKE 1  TABLET BY MOUTH THREE TIMES DAILY. 11/06/14  Yes Shelly Bombard, MD  Albiglutide (TANZEUM) 50 MG PEN Inject 50 mg weekly under skin 03/20/15  Yes Philemon Kingdom, MD  aspirin EC 81 MG tablet Take 81 mg by mouth daily.   Yes Historical Provider, MD  carvedilol (COREG) 6.25 MG tablet TAKE 1 TABLET BY MOUTH TWICE DAILY WITH A MEAL. 11/07/13  Yes Leonie Man, MD  Cyanocobalamin (B-12 PO) Take 1 tablet by mouth daily.    Yes Historical Provider, MD  fluticasone (FLONASE) 50 MCG/ACT nasal spray Place 1  spray into both nostrils daily as needed for allergies.  12/11/13  Yes Historical Provider, MD  gabapentin (NEURONTIN) 300 MG capsule Take 600 mg by mouth 2 (two) times daily.    Yes Historical Provider, MD  insulin aspart (NOVOLOG FLEXPEN) 100 UNIT/ML FlexPen Inject 10-12 Units into the skin 3 (three) times daily with meals. *Plus sliding scale. 03/20/15  Yes Philemon Kingdom, MD  Insulin Detemir (LEVEMIR FLEXTOUCH) 100 UNIT/ML Pen Inject 20 Units into the skin daily at 10 pm. 03/20/15  Yes Philemon Kingdom, MD  isosorbide mononitrate (IMDUR) 30 MG 24 hr tablet Take 0.5 tablets (15 mg total) by mouth daily. 11/29/13  Yes Brittainy Erie Noe, PA-C  lisinopril (PRINIVIL,ZESTRIL) 10 MG tablet Take 5 mg by mouth daily.    Yes Historical Provider, MD  metFORMIN (GLUCOPHAGE-XR) 500 MG 24 hr tablet Take 1 tablet (500 mg total) by mouth daily with supper. 03/20/15  Yes Philemon Kingdom, MD  nitroGLYCERIN (NITROSTAT) 0.4 MG SL tablet Place 1 tablet (0.4 mg total) under the tongue every 5 (five) minutes as needed for chest pain. 11/09/13  Yes Eileen Stanford, PA-C  omeprazole (PRILOSEC) 20 MG capsule Take 20 mg by mouth daily.   Yes Historical Provider, MD  simvastatin (ZOCOR) 40 MG tablet Take 1 tablet (40 mg total) by mouth daily at 6 PM. 11/09/13  Yes Eileen Stanford, PA-C  traZODone (DESYREL) 100 MG tablet Take 50 mg by mouth at bedtime as needed for sleep.    Yes Historical Provider, MD  triamcinolone cream  (KENALOG) 0.1 % Apply 1 application topically as needed (eczema).  08/22/13  Yes Historical Provider, MD  ACCU-CHEK AVIVA PLUS test strip TEST BLOOD SUGAR THREE TIMES DAILY AS DIRECTED 01/17/15   Philemon Kingdom, MD  albuterol (PROVENTIL HFA;VENTOLIN HFA) 108 (90 Base) MCG/ACT inhaler Inhale 1-2 puffs into the lungs every 6 (six) hours as needed for wheezing or shortness of breath. 03/27/15   Frederica Kuster, PA-C  clopidogrel (PLAVIX) 75 MG tablet TAKE 1 TABLET BY MOUTH EVERY DAY. Patient not taking: Reported on 03/27/2015 12/17/13   Leonie Man, MD  guaiFENesin 200 MG tablet Take 1 tablet (200 mg total) by mouth every 4 (four) hours as needed for cough or to loosen phlegm. 03/27/15   Frederica Kuster, PA-C  Insulin Pen Needle 32G X 4 MM MISC Use to inject insulin 4 times daily. 03/20/15   Philemon Kingdom, MD  Insulin Syringe-Needle U-100 (INSULIN SYRINGE 1CC/31GX5/16") 31G X 5/16" 1 ML MISC USE AS DIRECTED FOUR TIMES DAILY FOR INSULIN INJECTIONS 01/10/14   Philemon Kingdom, MD  terconazole (TERAZOL 7) 0.4 % vaginal cream Place 1 applicator vaginally at bedtime. Patient not taking: Reported on 03/27/2015 12/27/14   Rachelle A Denney, CNM   BP 116/64 mmHg  Pulse 85  Temp(Src) 99 F (37.2 C) (Oral)  Resp 22  Ht '5\' 7"'$  (1.702 m)  Wt 113.399 kg  BMI 39.15 kg/m2  SpO2 93% Physical Exam  Constitutional: She appears well-developed and well-nourished. No distress.  HENT:  Head: Normocephalic and atraumatic.  Mouth/Throat: Oropharynx is clear and moist. No oropharyngeal exudate.  Eyes: Conjunctivae are normal. Pupils are equal, round, and reactive to light. Right eye exhibits no discharge. Left eye exhibits no discharge. No scleral icterus.  Neck: Normal range of motion. Neck supple. No thyromegaly present.  Cardiovascular: Normal rate, regular rhythm and normal heart sounds.  Exam reveals no gallop and no friction rub.   No murmur heard. Pulmonary/Chest: Effort normal. No stridor. No  respiratory  distress. She has no wheezes. She has rhonchi. She has no rales. She exhibits tenderness.    Abdominal: Soft. Bowel sounds are normal. She exhibits no distension. There is no tenderness. There is no rebound and no guarding.  Musculoskeletal: She exhibits no edema.  Lymphadenopathy:    She has no cervical adenopathy.  Neurological: She is alert. Coordination normal.  Skin: Skin is warm and dry. No rash noted. She is not diaphoretic. No pallor.  Psychiatric: She has a normal mood and affect.  Nursing note and vitals reviewed.   ED Course  Procedures (including critical care time) Labs Review Labs Reviewed  BASIC METABOLIC PANEL - Abnormal; Notable for the following:    Chloride 99 (*)    Glucose, Bld 200 (*)    All other components within normal limits  CBC  PROTIME-INR  URINALYSIS, ROUTINE W REFLEX MICROSCOPIC (NOT AT [facility])  Randolm Idol, ED    Imaging Review Dg Chest 2 View  03/27/2015  CLINICAL DATA:  Productive cough and chest pain EXAM: CHEST  2 VIEW COMPARISON:  05/26/2012 FINDINGS: The heart size and mediastinal contours are within normal limits. Both lungs are clear. The visualized skeletal structures are unremarkable. IMPRESSION: No active cardiopulmonary disease. Electronically Signed   By: Inez Catalina M.D.   On: 03/27/2015 14:43   I have personally reviewed and evaluated these images and lab results as part of my medical decision-making.   EKG Interpretation None      MDM   Pt symptoms consistent with URI. Patient endorsed shortness of breath and rip pain. Patient afebrile. Tender on palpation, suspect soreness from vigorous coughing. CXR negative for acute infiltrate. Troponin negative. CBC WNL. EKG sinus tach 102.  No tonsillar exudate. Pt's shortness of breath improved after albuterol nebulizer treatment in ED. Pt will be discharged with symptomatic treatment, including guaifenesin and albuterol MDI.  Discussed return precautions. Pt to follow up with PCP as  needed for symptoms that are not improving.  Pt is hemodynamically stable & in NAD prior to discharge. Patient seen by Sula Rumple, PA-C who is in agreement with plan. Patient understands and agrees with the plan.    Final diagnoses:  URI (upper respiratory infection)  Shortness of breath       Frederica Kuster, PA-C 03/27/15 2341  Dorie Rank, MD 03/28/15 1236

## 2015-03-27 NOTE — ED Notes (Signed)
Pt able to ambulate to the room without difficulty

## 2015-03-27 NOTE — ED Notes (Signed)
Patient here with cough and congestion since the weekend. Reports that she had syncopal event this am while in kitchen. Coughing and having rib pain for same

## 2015-03-27 NOTE — Discharge Instructions (Signed)
Medications: Guaifenesin, Albuterol inhaler  Treatment: Take guaifenesin every 4 hours as needed for cough and to loosen your chest congestion. Take albuterol inhaler every 6 hours as needed for shortness of breath. You may take ibuprofen or naproxen for your chest soreness. You chest soreness should improve as your coughing improves. If not, please see your PCP or return to the emergency department. Quitting smoking would help your breathing and improve your health over all.   Follow-up: Please follow up with Dr.  Ouida Sills if your symptoms are worsening or do not improve.  Please return to the emergency department if your shortness of breath is worsening, you develop a fever, or any other new or concerning symptom. Upper Respiratory Infection, Adult Most upper respiratory infections (URIs) are a viral infection of the air passages leading to the lungs. A URI affects the nose, throat, and upper air passages. The most common type of URI is nasopharyngitis and is typically referred to as "the common cold." URIs run their course and usually go away on their own. Most of the time, a URI does not require medical attention, but sometimes a bacterial infection in the upper airways can follow a viral infection. This is called a secondary infection. Sinus and middle ear infections are common types of secondary upper respiratory infections. Bacterial pneumonia can also complicate a URI. A URI can worsen asthma and chronic obstructive pulmonary disease (COPD). Sometimes, these complications can require emergency medical care and may be life threatening.  CAUSES Almost all URIs are caused by viruses. A virus is a type of germ and can spread from one person to another.  RISKS FACTORS You may be at risk for a URI if:   You smoke.   You have chronic heart or lung disease.  You have a weakened defense (immune) system.   You are very young or very old.   You have nasal allergies or asthma.  You work in  crowded or poorly ventilated areas.  You work in health care facilities or schools. SIGNS AND SYMPTOMS  Symptoms typically develop 2-3 days after you come in contact with a cold virus. Most viral URIs last 7-10 days. However, viral URIs from the influenza virus (flu virus) can last 14-18 days and are typically more severe. Symptoms may include:   Runny or stuffy (congested) nose.   Sneezing.   Cough.   Sore throat.   Headache.   Fatigue.   Fever.   Loss of appetite.   Pain in your forehead, behind your eyes, and over your cheekbones (sinus pain).  Muscle aches.  DIAGNOSIS  Your health care provider may diagnose a URI by:  Physical exam.  Tests to check that your symptoms are not due to another condition such as:  Strep throat.  Sinusitis.  Pneumonia.  Asthma. TREATMENT  A URI goes away on its own with time. It cannot be cured with medicines, but medicines may be prescribed or recommended to relieve symptoms. Medicines may help:  Reduce your fever.  Reduce your cough.  Relieve nasal congestion. HOME CARE INSTRUCTIONS   Take medicines only as directed by your health care provider.   Gargle warm saltwater or take cough drops to comfort your throat as directed by your health care provider.  Use a warm mist humidifier or inhale steam from a shower to increase air moisture. This may make it easier to breathe.  Drink enough fluid to keep your urine clear or pale yellow.   Eat soups and other clear broths  and maintain good nutrition.   Rest as needed.   Return to work when your temperature has returned to normal or as your health care provider advises. You may need to stay home longer to avoid infecting others. You can also use a face mask and careful hand washing to prevent spread of the virus.  Increase the usage of your inhaler if you have asthma.   Do not use any tobacco products, including cigarettes, chewing tobacco, or electronic  cigarettes. If you need help quitting, ask your health care provider. PREVENTION  The best way to protect yourself from getting a cold is to practice good hygiene.   Avoid oral or hand contact with people with cold symptoms.   Wash your hands often if contact occurs.  There is no clear evidence that vitamin C, vitamin E, echinacea, or exercise reduces the chance of developing a cold. However, it is always recommended to get plenty of rest, exercise, and practice good nutrition.  SEEK MEDICAL CARE IF:   You are getting worse rather than better.   Your symptoms are not controlled by medicine.   You have chills.  You have worsening shortness of breath.  You have brown or red mucus.  You have yellow or brown nasal discharge.  You have pain in your face, especially when you bend forward.  You have a fever.  You have swollen neck glands.  You have pain while swallowing.  You have white areas in the back of your throat. SEEK IMMEDIATE MEDICAL CARE IF:   You have severe or persistent:  Headache.  Ear pain.  Sinus pain.  Chest pain.  You have chronic lung disease and any of the following:  Wheezing.  Prolonged cough.  Coughing up blood.  A change in your usual mucus.  You have a stiff neck.  You have changes in your:  Vision.  Hearing.  Thinking.  Mood. MAKE SURE YOU:   Understand these instructions.  Will watch your condition.  Will get help right away if you are not doing well or get worse.   This information is not intended to replace advice given to you by your health care provider. Make sure you discuss any questions you have with your health care provider.   Document Released: 06/23/2000 Document Revised: 05/14/2014 Document Reviewed: 04/04/2013 Elsevier Interactive Patient Education [employer].

## 2015-04-15 ENCOUNTER — Encounter: Payer: Self-pay | Admitting: Cardiology

## 2015-04-15 ENCOUNTER — Ambulatory Visit (INDEPENDENT_AMBULATORY_CARE_PROVIDER_SITE_OTHER): Payer: Medicaid Other | Admitting: Cardiology

## 2015-04-15 VITALS — BP 130/90 | HR 97 | Ht 67.5 in | Wt 249.4 lb

## 2015-04-15 DIAGNOSIS — E785 Hyperlipidemia, unspecified: Secondary | ICD-10-CM

## 2015-04-15 DIAGNOSIS — Z9861 Coronary angioplasty status: Secondary | ICD-10-CM

## 2015-04-15 DIAGNOSIS — I200 Unstable angina: Secondary | ICD-10-CM

## 2015-04-15 DIAGNOSIS — I10 Essential (primary) hypertension: Secondary | ICD-10-CM

## 2015-04-15 DIAGNOSIS — E1142 Type 2 diabetes mellitus with diabetic polyneuropathy: Secondary | ICD-10-CM

## 2015-04-15 DIAGNOSIS — E1121 Type 2 diabetes mellitus with diabetic nephropathy: Secondary | ICD-10-CM

## 2015-04-15 DIAGNOSIS — I2510 Atherosclerotic heart disease of native coronary artery without angina pectoris: Secondary | ICD-10-CM

## 2015-04-15 DIAGNOSIS — Z720 Tobacco use: Secondary | ICD-10-CM

## 2015-04-15 MED ORDER — ISOSORBIDE MONONITRATE ER 30 MG PO TB24: 15.0000 mg | ORAL_TABLET | Freq: Every day | ORAL | Status: DC

## 2015-04-15 MED ORDER — CARVEDILOL 6.25 MG PO TABS: 6.2500 mg | ORAL_TABLET | Freq: Two times a day (BID) | ORAL | Status: DC

## 2015-04-15 MED ORDER — NITROGLYCERIN 0.4 MG SL SUBL: 0.4000 mg | SUBLINGUAL_TABLET | SUBLINGUAL | Status: DC | PRN

## 2015-04-15 MED ORDER — CLOPIDOGREL BISULFATE 75 MG PO TABS: 75.0000 mg | ORAL_TABLET | Freq: Every day | ORAL | Status: DC

## 2015-04-15 NOTE — Patient Instructions (Signed)
THE FIRST 2 DAYS TAKE 1/2 TABLET OF CARVEDILOL TWICE A DAY THEN GO TO 1 TABLET TWICE A DAY  NO OTHER CHANGES WITH CURRENT MEDICATIONS  Your physician wants you to follow-up in oct 2017 with DR HARDING.  You will receive a reminder letter in the mail two months in advance. If you don't receive a letter, please call our office to schedule the follow-up appointment.   If you need a refill on your cardiac medications before your next appointment, please call your pharmacy.

## 2015-04-15 NOTE — Progress Notes (Signed)
PCP: Javier Docker, MD  Clinic Note: Chief Complaint  Patient presents with  . Annual Exam    Yes Swelling,No SOB,No Chast pain, Yes Dizziness  . Edema    left hand and left foot for 6 months  . Dizziness    1 month   HPI: Cassidy [name] is a 50 y.o. female with a PMH below who presents today for ~ 10 month f/u of CAD-PCI.Marland Kitchen Notable PMH of CAD- h/o Unstable Angina - PTCA:May of 2014 with a positive nuclear stress test. She was taken to cardiac catheterization lab Dr. Shelva Majestic and found to have distal RCA 90-95% extending to the ostium of both the PDA and PLA. She was treated with scoring balloon angioplasty of both ostia, no stents were placed. Normal LEA dopplers.  Had Cath with PCI later on in October 2015: POST-OPERATIVE DIAGNOSIS:   Severe single-vessel disease with a roughly 80% stenosis in the mid LAD, positive FFR of 0.73.  Successful FFR guided PCI of the mid LAD with a Resolute Integrity DES 3.0 mm x 22 mm postdilated to 3.3 mm proximally and 3.55 mm distally.  Moderate disease in the circumflex at the trifurcation with widely patent PTCA site in the RCA. FFR 0.86  Moderately elevated LVEDP with previously normal LVEF not evaluated today.   Cassidy [name] was last seen in June 2016 doing well. Recovering from Sgx for BREAST Abscess.  Recent Hospitalizations: none  Studies Reviewed: none  Interval History: Cassidy [name] presents today really without any true cardiac complaints. Her main complaint today is numbness in soft swelling in her left hand and foot Associated with tingling neuropathic symptoms. From a cardiac standpoint, she has been out of her carvedilol for about 2 months as well as her Plavix - because she ran out of her medications and did not have any more refills.  Despite this, she has denied any chest tightness or pressure/anginal symptoms with rest or exertion. No resting or exertional dyspnea. She has been trying to work on her walking and  exercise. She is starting to work endocrinologist to try to get her diabetes under control. She has changed her dietary habits and is starting to get into exercise.  Remainder of cardiac review of symptoms as follows:  No PND, orthopnea or edema.   No palpitations, lightheadedness, dizziness, weakness or syncope/near syncope.  No TIA/amaurosis fugax symptoms.  No claudication.  ROS: A comprehensive was performed. Review of Systems  Constitutional: Negative for malaise/fatigue.  HENT: Negative for congestion and nosebleeds.   Respiratory: Negative for cough, shortness of breath and wheezing.   Cardiovascular: Negative.        Per history of present illness  Gastrointestinal: Negative for heartburn, blood in stool and melena.  Genitourinary: Negative for hematuria.  Musculoskeletal: Negative for myalgias, joint pain (Some left shoulder) and falls.  Neurological: Positive for tingling (Left hand and foot in paroxysmal associated with a sensation of swelling). Negative for dizziness, weakness and headaches.  Endo/Heme/Allergies: Does not bruise/bleed easily.    Past Medical History  Diagnosis Date  . Hypertension   . GERD (gastroesophageal reflux disease)   . Breast abscess     a. right side.   Marland Kitchen HLD (hyperlipidemia)   . Type II diabetes mellitus ([place])   . Anemia   . Sickle cell trait ([place])   . Fibromyalgia   . Genital warts   . Diabetic peripheral neuropathy ([place])   . Depression   . Bipolar depression ([place])   .  PTSD (post-traumatic stress disorder)   . Tooth caries   . Pain in limb     a. LE VENOUS DUPLEX, 02/05/2009 - no evidence of deep vein thrombosis, Baker's cyst  . Atherosclerotic heart disease of native coronary artery with angina pectoris ([place]) 05/2012, 10/2013    a.  s/p PCTA to dRCA and ostial RPAV-PLA vessel + PDA branch (05/2012)  b. [place] s/p DES to mLAD - Resolute DES 3.0 x 22 (3.54m -->3.3 mm)  . Tobacco abuse     Past Surgical History  Procedure Laterality  Date  . Bladder surgery  1980    "TVT"  . Irrigation and debridement abscess  12/19/2010    Procedure: IRRIGATION AND DEBRIDEMENT ABSCESS;  Surgeon: MRolm Bookbinder MD;  [facility] [address]  Service: General;  Laterality: Right;  . Incise and drain abcess  ; 04/26/11; 08/13/11    right breast  . Tonsillectomy  1988  . Refractive surgery  ~ 2010    right  . Irrigation and debridement abscess  08/13/2011    Procedure: MINOR INCISION AND DRAINAGE OF ABSCESS;  Surgeon: JGwenyth Ober MD;  [place] [facility]  Service: General;  Laterality: Right;  Right Breast   . Irrigation and debridement abscess  11/17/2011    Procedure: IRRIGATION AND DEBRIDEMENT ABSCESS;  Surgeon: MImogene Burn TGeorgette Dover MD;  [facility];  Service: General;  Laterality: Right;  irrigation and debridement right recurrent breast abscess  . Breast surgery Right     I&D for multiple abscesses  . Eye surgery Left     laser surgery  . Larynx surgery    . Ployp removed from voice box 03/30/12    . Incision and drainage abscess Right 05/09/2012    Procedure: INCISION AND DRAINAGE RIGHT BREAST ABSCESS;  Surgeon: MImogene Burn TGeorgette Dover MD;  [place] [facility]  Service: General;  Laterality: Right;  . Nm myoview ltd  01/26/2009    Normal study, no evidence of ischemia, EF 67%  . Cutting balloon    . Left heart catheterization with coronary angiogram N/A 05/29/2012    Procedure: LEFT HEART CATHETERIZATION WITH CORONARY ANGIOGRAM & PTCA;  Surgeon: TTroy Sine MD;  [facility];  Service: Cardiovascular; Bifurcation dRCA-RPAD/PLA & rPDA  PTCA.  .Marland KitchenLeft heart catheterization with coronary angiogram N/A 11/08/2013    Procedure: LEFT HEART CATHETERIZATION WITH CORONARY ANGIOGRAM and Coronary Stent Intervention;  Surgeon: DLeonie Man MD;  [facility];  Service: Cardiovascular; mLAD 80% (FFR 0.73) - resolute DES 3.0 x 22 mm (postdilated to 3.3 mm->3.5 mm)  . Incision and drainage abscess Right 02/08/2014    Procedure: INCISION AND  DRAINAGE RIGHT BREAST ABSCESS;  Surgeon: MDonnie Mesa MD;  [place] [facility]  Service: General;  Laterality: Right;  . Incision and drainage abscess Right 06/14/2014    Procedure: INCISION AND DRAINAGE RIGHT BREAST ABSCESS;  Surgeon: MDonnie Mesa MD;  [place] [facility]  Service: General;  Laterality: Right;    Allergies  Allergen Reactions  . Amoxicillin Hives   Prior to Admission medications   Medication Sig Start Date End Date Taking? Authorizing Provider  ACCU-CHEK AVIVA PLUS test strip TEST BLOOD SUGAR THREE TIMES DAILY AS DIRECTED 01/17/15  Yes CPhilemon Kingdom MD  acyclovir (ZOVIRAX) 400 MG tablet TAKE 1 TABLET BY MOUTH THREE TIMES DAILY. 11/06/14  Yes CShelly Bombard MD  Albiglutide (TANZEUM) 50 MG PEN Inject 50 mg weekly under skin 03/20/15  Yes CPhilemon Kingdom MD  albuterol (PROVENTIL HFA;VENTOLIN HFA)  108 (90 Base) MCG/ACT inhaler Inhale 1-2 puffs into the lungs every 6 (six) hours as needed for wheezing or shortness of breath. 03/27/15  Yes [place], PA-C  aspirin EC 81 MG tablet Take 81 mg by mouth daily.   Yes Historical Provider, MD  carvedilol (COREG) 6.25 MG tablet TAKE 1 TABLET BY MOUTH TWICE DAILY WITH A MEAL. 11/07/13  Yes Leonie Man, MD  Cyanocobalamin (B-12 PO) Take 1 tablet by mouth daily.    Yes Historical Provider, MD  fluticasone (FLONASE) 50 MCG/ACT nasal spray Place 1 spray into both nostrils daily as needed for allergies.  12/11/13  Yes Historical Provider, MD  furosemide (LASIX) 20 MG tablet Take 20 mg by mouth daily. 04/10/15  Yes Historical Provider, MD  gabapentin (NEURONTIN) 300 MG capsule Take 600 mg by mouth 2 (two) times daily.    Yes Historical Provider, MD  guaiFENesin 200 MG tablet Take 1 tablet (200 mg total) by mouth every 4 (four) hours as needed for cough or to loosen phlegm. 03/27/15  Yes Alexandra M Law, PA-C  insulin aspart (NOVOLOG FLEXPEN) 100 UNIT/ML FlexPen Inject 10-12 Units into the skin 3 (three) times daily with meals. *Plus sliding  scale. 03/20/15  Yes Philemon Kingdom, MD  Insulin Detemir (LEVEMIR FLEXTOUCH) 100 UNIT/ML Pen Inject 20 Units into the skin daily at 10 pm. 03/20/15  Yes Philemon Kingdom, MD  Insulin Pen Needle 32G X 4 MM MISC Use to inject insulin 4 times daily. 03/20/15  Yes Philemon Kingdom, MD  Insulin Syringe-Needle U-100 (INSULIN SYRINGE 1CC/31GX5/16") 31G X 5/16" 1 ML MISC USE AS DIRECTED FOUR TIMES DAILY FOR INSULIN INJECTIONS 01/10/14  Yes Philemon Kingdom, MD  isosorbide mononitrate (IMDUR) 30 MG 24 hr tablet Take 0.5 tablets (15 mg total) by mouth daily. 11/29/13  Yes Brittainy Erie Noe, PA-C  lisinopril (PRINIVIL,ZESTRIL) 10 MG tablet Take 5 mg by mouth daily.    Yes Historical Provider, MD  metFORMIN (GLUCOPHAGE-XR) 500 MG 24 hr tablet Take 1 tablet (500 mg total) by mouth daily with supper. 03/20/15  Yes Philemon Kingdom, MD  nitroGLYCERIN (NITROSTAT) 0.4 MG SL tablet Place 1 tablet (0.4 mg total) under the tongue every 5 (five) minutes as needed for chest pain. 11/09/13  Yes Eileen Stanford, PA-C  omeprazole (PRILOSEC) 20 MG capsule Take 20 mg by mouth daily.   Yes Historical Provider, MD  simvastatin (ZOCOR) 40 MG tablet Take 1 tablet (40 mg total) by mouth daily at 6 PM. 11/09/13  Yes Eileen Stanford, PA-C  traZODone (DESYREL) 100 MG tablet Take 50 mg by mouth at bedtime as needed for sleep.    Yes Historical Provider, MD  triamcinolone cream (KENALOG) 0.1 % Apply 1 application topically as needed (eczema).  08/22/13  Yes Historical Provider, MD  clopidogrel (PLAVIX) 75 MG tablet TAKE 1 TABLET BY MOUTH EVERY DAY. Patient not taking: Reported on 03/27/2015 12/17/13   Leonie Man, MD  terconazole (TERAZOL 7) 0.4 % vaginal cream Place 1 applicator vaginally at bedtime. Patient not taking: Reported on 03/27/2015 12/27/14   Morene Crocker, CNM    Social History   Social History  . Marital Status: Single    Spouse Name: N/A  . Number of Children: 1  . Years of Education: N/A   Social History  Main Topics  . Smoking status: Current Every Day Smoker -- 0.25 packs/day for 30 years    Types: Cigarettes  . Smokeless tobacco: Never Used     Comment: 08/14/11 "  quit for 3 wk 05/2011; was smoking 1 ppd since age 60 before I quit; at least I've cut down to 1/4 ppd"  . 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 Drug Use: No     Comment: 08/14/11 'quit everything in 2005"  . Sexual Activity:    Partners: Male    Birth Control/ Protection: Condom   Other Topics Concern  . None   Social History Narrative   Work: disability   Children: son, 80 yrs old   Regular exercise: some/ heart attack in May/ walks 1 mile 3 days a week   Caffeine use: daily, cup of coffee daily   Family History  Problem Relation Age of Onset  . COPD Mother   . Cancer Mother     lymphoma  . Heart disease Mother   . Cancer Maternal Aunt     breast, colon  . Hyperlipidemia Father   . Heart disease Father   . Hypertension Brother   . Hypertension Maternal Grandmother   . Diabetes Maternal Grandmother     Wt Readings from Last 3 Encounters:  04/15/15 249 lb 6.4 oz (113.127 kg)  03/27/15 250 lb (113.399 kg)  03/20/15 252 lb 12.8 oz (114.669 kg)    PHYSICAL EXAM BP 130/90 mmHg  Pulse 97  Ht 5' 7.5" (1.715 m)  Wt 249 lb 6.4 oz (113.127 kg)  BMI 38.46 kg/m2 General appearance: alert, cooperative, appears stated age, no distress and moderately obese Neck: no adenopathy, no carotid bruit and no JVD Lungs: clear to auscultation bilaterally, normal percussion bilaterally and non-labored Heart: regular rate and rhythm, S1, S2 normal, no murmur, click, rub or gallop; nondisplaced PMI Abdomen: soft, non-tender; bowel sounds normal; no masses, no organomegaly; no HJR Extremities: extremities normal, atraumatic, no cyanosis, and edema  Pulses: 2+ and symmetric; Skin: normal  Neurologic: Mental status: Alert, oriented, thought content  appropriate Cranial nerves: normal (II-XII grossly intact)    Adult ECG Report  Rate: 97 ;  Rhythm: normal sinus rhythm and Septal Q waves suggesting septal infarct, age indeterminate. Otherwise normal axis, intervals and durations.;   Narrative Interpretation: Stable EKG  Other studies Reviewed: Additional studies/ records that were reviewed today include:  Recent Labs:   Lab Results  Component Value Date   CHOL 171 11/29/2014   HDL 47.90 11/29/2014   LDLCALC 99 11/29/2014   TRIG 119.0 11/29/2014   CHOLHDL 4 11/29/2014   ASSESSMENT / PLAN: Problem List Items Addressed This Visit    Unstable angina pectoris ([place])    Since her LAD PCI no further symptoms. Okay to DC Imdur. Continue other medications for CAD.      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 Gherge)/ I wonder if some of this tingling pain in her left hand and left foot is related to diabetic neuropathy.      Tobacco abuse (Chronic)    Unfortunately, she continues to smoke having restarted after PCI. She indicated that she is trying to cut back, we didn't spend too long discussing this. We did discuss the importance of smoking cessation.      Hyperlipidemia with target LDL less than 70 (Chronic)  Remains on simvastatin. Labs are now being followed by her endocrinologist. Her LDL was not quite at goal back in November. May need to consider going to a stronger statin versus titrating the dose. She should be having labs rechecked soon. We will look for results.      Relevant Medications   furosemide (LASIX) 20 MG tablet   isosorbide mononitrate (IMDUR) 30 MG 24 hr tablet   carvedilol (COREG) 6.25 MG tablet   nitroGLYCERIN (NITROSTAT) 0.4 MG SL tablet    Essential hypertension (Chronic)    Actually not that poorly controlled despite being off of carvedilol. She is on 10 of lisinopril and we are restarting carvedilol. I suspect her blood pressure will be better on the carvedilol.      Relevant Medications   furosemide (LASIX) 20 MG tablet   isosorbide mononitrate (IMDUR) 30 MG 24 hr tablet   carvedilol (COREG) 6.25 MG tablet   nitroGLYCERIN (NITROSTAT) 0.4 MG SL tablet   Other Relevant Orders   EKG 12-Lead (Completed)   Diabetic peripheral neuropathy (HCC)    Question if her tingling in hands and feet are related to neuropathy.      CAD S/P percutaneous coronary angioplasty - Primary (Chronic)    No further anginal symptoms after PTCA to the RCA system and DES PCI to the LAD. With the amount of work that was done on her and the LAD stent, prefer for her to stay on Plavix. Once on Plavix she can probably stop aspirin We need to restart her carvedilol which we'll titrate back on. On statin, and ACE inhibitor. She has been out of Imdur, I think were fine having or not restart that.      Relevant Medications   furosemide (LASIX) 20 MG tablet   isosorbide mononitrate (IMDUR) 30 MG 24 hr tablet   carvedilol (COREG) 6.25 MG tablet   nitroGLYCERIN (NITROSTAT) 0.4 MG SL tablet   Other Relevant Orders   EKG 12-Lead (Completed)      Current medicines are reviewed at length with the patient today. (+/- concerns) Has been out of Plavix and carvedilol. The following changes have been made: Restart medications as follows - Restart Plavix with 2 tablets on the first day then 1 tablet daily - Restart carvedilol: THE FIRST 2 DAYS TAKE 1/2 TABLET OF CARVEDILOL TWICE A DAY THEN GO TO 1 TABLET TWICE A DAY  Studies Ordered:   Orders Placed This Encounter  Procedures  . EKG 12-Lead   ROV: October 2017   Leonie Man, M.D., M.S. Interventional Cardiologist   Pager # [phone] Phone # [phone] [address] [place], [place] [address]

## 2015-04-17 ENCOUNTER — Encounter: Payer: Self-pay | Admitting: Cardiology

## 2015-04-17 NOTE — Assessment & Plan Note (Addendum)
Remains on simvastatin. Labs are now being followed by her endocrinologist. Her LDL was not quite at goal back in November. May need to consider going to a stronger statin versus titrating the dose. She should be having labs rechecked soon. We will look for results.

## 2015-04-17 NOTE — Assessment & Plan Note (Signed)
Unfortunately, she continues to smoke having restarted after PCI. She indicated that she is trying to cut back, we didn't spend too long discussing this. We did discuss the importance of smoking cessation.

## 2015-04-17 NOTE — Assessment & Plan Note (Signed)
She is now working with an endocrinologist (Dr. Gherge)/ I wonder if some of this tingling pain in her left hand and left foot is related to diabetic neuropathy.

## 2015-04-17 NOTE — Assessment & Plan Note (Signed)
Since her LAD PCI no further symptoms. Okay to DC Imdur. Continue other medications for CAD.

## 2015-04-17 NOTE — Assessment & Plan Note (Signed)
Question if her tingling in hands and feet are related to neuropathy.

## 2015-04-17 NOTE — Assessment & Plan Note (Signed)
No further anginal symptoms after PTCA to the RCA system and DES PCI to the LAD. With the amount of work that was done on her and the LAD stent, prefer for her to stay on Plavix. Once on Plavix she can probably stop aspirin We need to restart her carvedilol which we'll titrate back on. On statin, and ACE inhibitor. She has been out of Imdur, I think were fine having or not restart that.

## 2015-04-17 NOTE — Assessment & Plan Note (Signed)
Actually not that poorly controlled despite being off of carvedilol. She is on 10 of lisinopril and we are restarting carvedilol. I suspect her blood pressure will be better on the carvedilol.

## 2015-04-17 NOTE — Assessment & Plan Note (Signed)
" >>  ASSESSMENT AND PLAN FOR UNSTABLE ANGINA PECTORIS (HCC) WRITTEN ON 04/17/2015 10:57 PM BY Maddoxx Burkitt W, MD  Since her LAD PCI no further symptoms. Okay to DC Imdur . Continue other medications for CAD. "

## 2015-04-24 ENCOUNTER — Other Ambulatory Visit: Payer: Self-pay | Admitting: Internal Medicine

## 2015-05-08 ENCOUNTER — Ambulatory Visit (INDEPENDENT_AMBULATORY_CARE_PROVIDER_SITE_OTHER): Payer: Medicaid Other | Admitting: Internal Medicine

## 2015-05-08 ENCOUNTER — Encounter: Payer: Self-pay | Admitting: Internal Medicine

## 2015-05-08 VITALS — BP 118/80 | HR 104 | Temp 97.8°F | Resp 12 | Wt 247.0 lb

## 2015-05-08 DIAGNOSIS — E1121 Type 2 diabetes mellitus with diabetic nephropathy: Secondary | ICD-10-CM

## 2015-05-08 DIAGNOSIS — R8290 Unspecified abnormal findings in urine: Secondary | ICD-10-CM

## 2015-05-08 LAB — URINALYSIS WITH CULTURE, IF INDICATED
Bilirubin Urine: NEGATIVE
HGB URINE DIPSTICK: NEGATIVE
KETONES UR: NEGATIVE
Leukocytes, UA: NEGATIVE
Nitrite: POSITIVE — AB
SPECIFIC GRAVITY, URINE: 1.02 (ref 1.000–1.030)
Total Protein, Urine: NEGATIVE
URINE GLUCOSE: NEGATIVE
UROBILINOGEN UA: 0.2 (ref 0.0–1.0)
pH: 5.5 (ref 5.0–8.0)

## 2015-05-08 NOTE — Progress Notes (Signed)
Patient ID: Cassidy [name], female   DOB: [birth year], 50 y.o.   MRN: [id number] ,  HPI: Cassidy [name] is a 50 y.o.-year-old female, initially referred by her PCP, Dellie Catholic, for management of DM2, dx 2005, insulin-dependent since 05/2012, uncontrolled, with complications (CAD, peripheral neuropathy). Last visit 1.5 mo ago.   Since last visit, she saw her cardiologist >> "I got a good report". She was taken off Imdur and restarted Carvedilol. Dr. Allison Quarry note from 04/2015 reviewed.  She lost M'aid for a period of time before last visit >> now back on it. She was off NovoLog x 2 months >> restarted end of 01/2015. She are off Levemir for last 3 mo. She are off Metformin for last 3 mo. She continued on Tanzeum, only off x 1 week.  Last hemoglobin A1c was: Lab Results  Component Value Date   HGBA1C 8.2 03/20/2015   HGBA1C 6.9* 11/29/2014   HGBA1C 6.5* 02/08/2014   Pt is on a regimen of: - Metformin XR 500 mg at dinnertime >> nausea, diarrhea if takes it in am >> at dinnertime  - Tanzeum 50 mg under skin once a week (missed it last week) >> upper 200s - Levemir 20 units at bedtime - Novolog 10-12 units 3x a day - NovoLog SSI:  - 150-175: + 1 unit  - 176-200: + 2 units  - 201-225: + 3 units  - 226-250: + 4 units  - >250: + 5 units  We added Invokana 100 mg 10/2014 >> pt did not return for a visit after this as advised. She had severe yeast infections >> had to stop. Was on Regular Metformin >> diarrhea.   Pt checks her sugars 3 a day and they are still high: - am: 134-179, 201, 204 >> 150-190s, 220 >> 141-190s (prev. 100-112) >> 155, 182-297 - 2h after b'fast: 79-139 >> n/c - before lunch: 111-167 >> 88, 92, 98, 146 >> 120-183, 231 >> 140-170 >> 130-190 >> 223, 242 - 2h after lunch: not checking - before dinner: 103-126 >> 102-113 >> 148-149, 181 >> 150-180s >> >> 130-235 >> 141, 239-243 - 2h after dinner:120-212 >> 124, 139  >> n/c >> 194, 187-247 - bedtime:147-223 >>   171, 178, 202, 274 x 1 >> 150, 181-213 >> 220s >> 300s,  But no recent checks - nighttime: not checking AccuChek.  No lows. Lowest sugar was 120 >> 83 (pm) >> 130; she has hypoglycemia awareness at 90s. Highest sugar was 231 >> 258 >> 300s.  Pt's meals are - per last visit's review: - Breakfast: glass of skim milk, [place] sausage (not bacon anymore), grits - 1 cup, toast - Lunch: salads and soups (Health[employer]) - Dinner: pinto beans, casseroles, [place] and chicken meat; spaghetti Walks after dinner. - Snacks: baby carrots, jello  - no CKD, last BUN/creatinine:  Lab Results  Component Value Date   BUN 8 03/27/2015   CREATININE 0.71 03/27/2015  She is on Lisinopril 10.  - last set of lipids: Lab Results  Component Value Date   CHOL 171 11/29/2014   HDL 47.90 11/29/2014   LDLCALC 99 11/29/2014   TRIG 119.0 11/29/2014   CHOLHDL 4 11/29/2014  She is on Lipitor 20. She is on ASA 81. - last eye exam was in 09/2012. 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 Was seeing Dr Ricki Miller >> retired.  - + numbness and tingling in her feet >> was on Neurontin 600 mg bid (M'aid would not approve more) >>  on Neurontin 300 bid + Lyrica 75 bid.   She also has a history of bipolar disorder, HL, GERD, back pain.  ROS: Constitutional: no weight gain, + fatigue Eyes: + blurry vision, no xerophthalmia  ENT: no sore throat, no nodules palpated in throat, no dysphagia/odynophagia, no hoarseness Cardiovascular:no CP/no SOB/+ palpitations/+ leg swelling Respiratory:no cough/SOB/wheezing Gastrointestinal: no N/V/D/+ C Musculoskeletal: no muscle/joint aches Skin: no rashes, + hair loss Neurological: no tremors/numbness/tingling/dizziness  I reviewed pt's medications, allergies, PMH, social hx, family hx, and changes were documented in the history of present illness. Otherwise, unchanged from my initial visit note.  PE: BP 118/80 mmHg  Pulse 104  Temp(Src) 97.8 F (36.6 C) (Oral)  Resp 12  Wt 247 lb  (112.038 kg)  SpO2 97% Body mass index is 38.09 kg/(m^2). Wt Readings from Last 3 Encounters:  05/08/15 247 lb (112.038 kg)  04/15/15 249 lb 6.4 oz (113.127 kg)  03/27/15 250 lb (113.399 kg)   Constitutional: overweight, in NAD Eyes: PERRLA, EOMI, no exophthalmos ENT: moist mucous membranes, no thyromegaly, no cervical lymphadenopathy Cardiovascular: RRR, No MRG Respiratory: CTA B Gastrointestinal: abdomen soft, NT, ND, BS+ Musculoskeletal: no deformities, strength intact in all 4 Skin: moist, warm, no rashes  ASSESSMENT: 1. DM2, insulin-dependent, uncontrolled, with complications - CAD - s/p AMI 05/2012, s/p PTCA to the RCA and DES to LAD Had normal lower arterial duplex study 08/03/2012. - peripheral neuropathy (on Neurontin)  2. Strong urine smell  PLAN:  1. Patient with long-standing, uncontrolled diabetes (last HbA1c was higher, at 8.2% as he was out of most meds, including insulin for few months before last visit (insurance pbs). We restarted her meds and increased her Tanzeum.  - at this visit, sugars are still very high (she also forgot last Tanzeum dose...) >> strongly re-enforced compliance with the regimen and we will also increase Levemir dose. - will continue current regimen: Patient Instructions  Please continue:  - Metformin XR 500 mg at dinnertime. - Tanzeum 50 mg under skin once a week - Novolog 10-12 units 3x a day - NovoLog SSI:  - 150-175: + 1 unit  - 176-200: + 2 units  - 201-225: + 3 units  - 226-250: + 4 units  - >250: + 5 units  Please increase Levemir to 25 units tonight and then increase to 30 tomorrow night.  Please call and schedule an eye appt with Dr. Prudencio Burly: [facility]:  Dr. Sherlyn Lick MD ?  Address: [address], [place], [place] [address]  [phone]  Please stop at the lab.  Please return in 1.5 month with your sugar log.   - continue checking CBGs 3x times a day, rotating checks - given new sugar  logs - due for yearly eye exams >> again advised to schedule >> given Dr Dicky Doe info - given her the flu vaccine this season - Return to clinic in 1.5 mo with sugar log   2. Strong urine smell - will check U/A  Office Visit on 05/08/2015  Component Date Value Ref Range Status  . Color, Urine 05/08/2015 YELLOW  Yellow;Lt. Yellow Final  . APPearance 05/08/2015 CLOUDY* Clear Final  . Specific Gravity, Urine 05/08/2015 1.020  1.000-1.030 Final  . pH 05/08/2015 5.5  5.0 - 8.0 Final  . Total Protein, Urine 05/08/2015 NEGATIVE  Negative Final  . Urine Glucose 05/08/2015 NEGATIVE  Negative Final  . Ketones, ur 05/08/2015 NEGATIVE  Negative Final  . Bilirubin Urine 05/08/2015 NEGATIVE  Negative Final  .  Hgb urine dipstick 05/08/2015 NEGATIVE  Negative Final  . Urobilinogen, UA 05/08/2015 0.2  0.0 - 1.0 Final  . Leukocytes, UA 05/08/2015 NEGATIVE  Negative Final  . Nitrite 05/08/2015 POSITIVE* Negative Final  . WBC, UA 05/08/2015 7-10/hpf* 0-2/hpf Final   Results faxed to site/floor on 05/08/2015 4:23 PM by Delorise Jackson.  . RBC / HPF 05/08/2015 0-2/hpf  0-2/hpf Final  . Squamous Epithelial / LPF 05/08/2015 Few(5-10/hpf)* Rare(0-4/hpf) Final  . Bacteria, UA 05/08/2015 Many(>50/hpf)* None Final  . Urine Culture, Routine 05/08/2015 Final report*  Final  . Urine Culture result 1 05/08/2015 Escherichia coli*  Final   Greater than 100,000 colony forming units per mL  . ANTIMICROBIAL SUSCEPTIBILITY 05/08/2015 Comment   Final   Comment:       ** S = Susceptible; I = Intermediate; R = Resistant **                    P = Positive; N = Negative             MICS are expressed in micrograms per mL    Antibiotic                 RSLT#1    RSLT#2    RSLT#3    RSLT#4 Amoxicillin/Clavulanic Acid    S Ampicillin                     R Cefepime                       S Ceftriaxone                    S Cefuroxime                     S Cephalothin                    S Ciprofloxacin                   R Ertapenem                      S Gentamicin                     R Imipenem                       S Levofloxacin                   R Nitrofurantoin                 S Piperacillin                   R Tetracycline                   R Tobramycin                     I Trimethoprim/Sulfa             R    UTI with Ecoli. Will start Nitrofurantoin 100 mg bid x 3 days.

## 2015-05-08 NOTE — Patient Instructions (Addendum)
Please continue:  - Metformin XR 500 mg at dinnertime. - Tanzeum 50 mg under skin once a week - Novolog 10-12 units 3x a day - NovoLog SSI:  - 150-175: + 1 unit  - 176-200: + 2 units  - 201-225: + 3 units  - 226-250: + 4 units  - >250: + 5 units  Please increase Levemir to 25 units tonight and then increase to 30 tomorrow night.  Please call and schedule an eye appt with Dr. Prudencio Burly: [facility]:  Dr. Sherlyn Lick MD ?  Address: [address], [place], [place] [address]  [phone]  Please stop at the lab.  Please return in 1.5 month with your sugar log.

## 2015-05-13 ENCOUNTER — Telehealth: Payer: Self-pay | Admitting: Internal Medicine

## 2015-05-13 LAB — URINE CULTURE

## 2015-05-13 MED ORDER — NITROFURANTOIN MONOHYD MACRO 100 MG PO CAPS: 100.0000 mg | ORAL_CAPSULE | Freq: Two times a day (BID) | ORAL | Status: DC

## 2015-05-13 NOTE — Telephone Encounter (Signed)
Called pt and advised her per Dr Arman Filter result note (sent via [place]). Pt voiced understanding and will contact her PCP if her sx do not relieve or the smell continues after the abx is continuing.

## 2015-05-13 NOTE — Telephone Encounter (Signed)
PT calling requesting results from her recent lab test.

## 2015-05-22 ENCOUNTER — Encounter (HOSPITAL_COMMUNITY): Payer: Self-pay | Admitting: Radiology

## 2015-05-22 ENCOUNTER — Emergency Department (HOSPITAL_COMMUNITY)
Admission: EM | Admit: 2015-05-22 | Discharge: 2015-05-22 | Disposition: A | Discharge status: Home / Self Care | Payer: Medicaid Other | Attending: Emergency Medicine | Admitting: Emergency Medicine

## 2015-05-22 ENCOUNTER — Emergency Department (HOSPITAL_COMMUNITY): Payer: Medicaid Other

## 2015-05-22 DIAGNOSIS — K219 Gastro-esophageal reflux disease without esophagitis: Secondary | ICD-10-CM | POA: Insufficient documentation

## 2015-05-22 DIAGNOSIS — E785 Hyperlipidemia, unspecified: Secondary | ICD-10-CM | POA: Insufficient documentation

## 2015-05-22 DIAGNOSIS — Z792 Long term (current) use of antibiotics: Secondary | ICD-10-CM | POA: Diagnosis not present

## 2015-05-22 DIAGNOSIS — R1030 Lower abdominal pain, unspecified: Secondary | ICD-10-CM

## 2015-05-22 DIAGNOSIS — E1140 Type 2 diabetes mellitus with diabetic neuropathy, unspecified: Secondary | ICD-10-CM | POA: Insufficient documentation

## 2015-05-22 DIAGNOSIS — I10 Essential (primary) hypertension: Secondary | ICD-10-CM | POA: Diagnosis not present

## 2015-05-22 DIAGNOSIS — Z794 Long term (current) use of insulin: Secondary | ICD-10-CM | POA: Diagnosis not present

## 2015-05-22 DIAGNOSIS — N390 Urinary tract infection, site not specified: Secondary | ICD-10-CM | POA: Diagnosis not present

## 2015-05-22 DIAGNOSIS — Z7984 Long term (current) use of oral hypoglycemic drugs: Secondary | ICD-10-CM | POA: Diagnosis not present

## 2015-05-22 DIAGNOSIS — F319 Bipolar disorder, unspecified: Secondary | ICD-10-CM | POA: Insufficient documentation

## 2015-05-22 DIAGNOSIS — Z7982 Long term (current) use of aspirin: Secondary | ICD-10-CM | POA: Diagnosis not present

## 2015-05-22 DIAGNOSIS — Z7951 Long term (current) use of inhaled steroids: Secondary | ICD-10-CM | POA: Insufficient documentation

## 2015-05-22 DIAGNOSIS — F4310 Post-traumatic stress disorder, unspecified: Secondary | ICD-10-CM | POA: Insufficient documentation

## 2015-05-22 DIAGNOSIS — F17210 Nicotine dependence, cigarettes, uncomplicated: Secondary | ICD-10-CM | POA: Diagnosis not present

## 2015-05-22 DIAGNOSIS — I2510 Atherosclerotic heart disease of native coronary artery without angina pectoris: Secondary | ICD-10-CM | POA: Diagnosis not present

## 2015-05-22 DIAGNOSIS — Z79899 Other long term (current) drug therapy: Secondary | ICD-10-CM | POA: Diagnosis not present

## 2015-05-22 DIAGNOSIS — R1031 Right lower quadrant pain: Secondary | ICD-10-CM | POA: Diagnosis present

## 2015-05-22 LAB — URINALYSIS, ROUTINE W REFLEX MICROSCOPIC
Bilirubin Urine: NEGATIVE
GLUCOSE, UA: NEGATIVE mg/dL
HGB URINE DIPSTICK: NEGATIVE
Ketones, ur: NEGATIVE mg/dL
Leukocytes, UA: NEGATIVE
Nitrite: POSITIVE — AB
PH: 6 (ref 5.0–8.0)
PROTEIN: NEGATIVE mg/dL
SPECIFIC GRAVITY, URINE: 1.014 (ref 1.005–1.030)

## 2015-05-22 LAB — COMPREHENSIVE METABOLIC PANEL
ALK PHOS: 83 U/L (ref 38–126)
ALT: 11 U/L — ABNORMAL LOW (ref 14–54)
ANION GAP: 8 (ref 5–15)
AST: 12 U/L — AB (ref 15–41)
Albumin: 4.6 g/dL (ref 3.5–5.0)
BILIRUBIN TOTAL: 0.7 mg/dL (ref 0.3–1.2)
BUN: 14 mg/dL (ref 6–20)
CO2: 24 mmol/L (ref 22–32)
CREATININE: 0.59 mg/dL (ref 0.44–1.00)
Calcium: 9.4 mg/dL (ref 8.9–10.3)
Chloride: 107 mmol/L (ref 101–111)
GFR calc Af Amer: >60 mL/min (ref >60)
Glucose, Bld: 89 mg/dL (ref 65–99)
POTASSIUM: 3.6 mmol/L (ref 3.5–5.1)
Sodium: 139 mmol/L (ref 135–145)
Total Protein: 7.9 g/dL (ref 6.5–8.1)

## 2015-05-22 LAB — CBC
HEMATOCRIT: 39.9 % (ref 36.0–46.0)
Hemoglobin: 13.8 g/dL (ref 12.0–15.0)
MCH: 29.5 pg (ref 26.0–34.0)
MCHC: 34.6 g/dL (ref 30.0–36.0)
MCV: 85.3 fL (ref 78.0–100.0)
PLATELETS: 334 10*3/uL (ref 150–400)
RBC: 4.68 MIL/uL (ref 3.87–5.11)
RDW: 13.2 % (ref 11.5–15.5)
WBC: 13.5 10*3/uL — ABNORMAL HIGH (ref 4.0–10.5)

## 2015-05-22 LAB — URINE MICROSCOPIC-ADD ON: RBC / HPF: NONE SEEN RBC/hpf (ref 0–5)

## 2015-05-22 LAB — LIPASE, BLOOD: Lipase: 38 U/L (ref 11–51)

## 2015-05-22 MED ORDER — CEPHALEXIN 500 MG PO CAPS: 500.0000 mg | ORAL_CAPSULE | Freq: Two times a day (BID) | ORAL | Status: DC

## 2015-05-22 MED ORDER — PHENAZOPYRIDINE HCL 100 MG PO TABS: 100.0000 mg | ORAL_TABLET | Freq: Three times a day (TID) | ORAL | Status: DC

## 2015-05-22 MED ORDER — DIATRIZOATE MEGLUMINE & SODIUM 66-10 % PO SOLN
15.0000 mL | Freq: Once | ORAL | Status: AC
  Administered 2015-05-22: 15 mL via ORAL

## 2015-05-22 MED ORDER — OXYCODONE-ACETAMINOPHEN 5-325 MG PO TABS
1.0000 | ORAL_TABLET | Freq: Once | ORAL | Status: AC
  Administered 2015-05-22: 1 via ORAL
  Filled 2015-05-22: qty 1

## 2015-05-22 MED ORDER — SODIUM CHLORIDE 0.9 % IV BOLUS (SEPSIS)
1000.0000 mL | Freq: Once | INTRAVENOUS | Status: AC
  Administered 2015-05-22: 1000 mL via INTRAVENOUS

## 2015-05-22 MED ORDER — ONDANSETRON HCL 4 MG/2ML IJ SOLN
4.0000 mg | Freq: Once | INTRAMUSCULAR | Status: AC
  Administered 2015-05-22: 4 mg via INTRAVENOUS
  Filled 2015-05-22: qty 2

## 2015-05-22 MED ORDER — MORPHINE SULFATE (PF) 4 MG/ML IV SOLN
4.0000 mg | Freq: Once | INTRAVENOUS | Status: AC
  Administered 2015-05-22: 4 mg via INTRAVENOUS
  Filled 2015-05-22: qty 1

## 2015-05-22 MED ORDER — IOPAMIDOL (ISOVUE-300) INJECTION 61%
100.0000 mL | Freq: Once | INTRAVENOUS | Status: AC | PRN
  Administered 2015-05-22: 100 mL via INTRAVENOUS

## 2015-05-22 MED ORDER — CEFTRIAXONE SODIUM 1 G IJ SOLR
1.0000 g | Freq: Once | INTRAMUSCULAR | Status: AC
  Administered 2015-05-22: 1 g via INTRAVENOUS
  Filled 2015-05-22: qty 10

## 2015-05-22 MED ORDER — PHENAZOPYRIDINE HCL 100 MG PO TABS
100.0000 mg | ORAL_TABLET | Freq: Three times a day (TID) | ORAL | Status: DC
  Administered 2015-05-22: 100 mg via ORAL
  Filled 2015-05-22: qty 1

## 2015-05-22 NOTE — ED Provider Notes (Signed)
CSN: [id number]     Arrival date & time 05/22/15  0022 History  By signing my name below, I, Rowan Blase, attest that this documentation has been prepared under the direction and in the presence of Merryl Hacker, MD . Electronically Signed: Rowan Blase, Scribe. 05/22/2015. 12:54 AM.   Chief Complaint  Patient presents with  . Abdominal Pain   The history is provided by the patient. No language interpreter was used.   HPI Comments:  Cassidy [name] is a 50 y.o. female with PMhx of HTN, GERD, HLD and DM who presents to the Emergency Department via EMS complaining of intermittent, 10/10 right lower abdominal pain onset 3 hours ago, radiating to back. Pt reports 3 episodes of diarrhea yesterday. No alleviating factors noted. Pt was recently diagnosed with E.Coli positive UTI; she just finished antibiotic. Continues to report foul-smelling urine. Denies dysuria, hematuria, h/o kidney stones, fever, bloody stools, or vomiting.  Past Medical History  Diagnosis Date  . Hypertension   . GERD (gastroesophageal reflux disease)   . Breast abscess     a. right side.   Marland Kitchen HLD (hyperlipidemia)   . Type II diabetes mellitus ([place])   . Anemia   . Sickle cell trait ([place])   . Fibromyalgia   . Genital warts   . Diabetic peripheral neuropathy ([place])   . Depression   . Bipolar depression ([place])   . PTSD (post-traumatic stress disorder)   . Tooth caries   . Pain in limb     a. LE VENOUS DUPLEX, 02/05/2009 - no evidence of deep vein thrombosis, Baker's cyst  . Atherosclerotic heart disease of native coronary artery with angina pectoris ([place]) 05/2012, 10/2013    a.  s/p PCTA to dRCA and ostial RPAV-PLA vessel + PDA branch (05/2012)  b. [place] s/p DES to mLAD - Resolute DES 3.0 x 22 (3.23m -->3.3 mm)  . Tobacco abuse    Past Surgical History  Procedure Laterality Date  . Bladder surgery  1980    "TVT"  . Irrigation and debridement abscess  12/19/2010    Procedure: IRRIGATION AND DEBRIDEMENT ABSCESS;   Surgeon: MRolm Bookbinder MD;  Location: [facility]  Service: General;  Laterality: Right;  . Incise and drain abcess  ; 04/26/11; 08/13/11    right breast  . Tonsillectomy  1988  . Refractive surgery  ~ 2010    right  . Irrigation and debridement abscess  08/13/2011    Procedure: MINOR INCISION AND DRAINAGE OF ABSCESS;  Surgeon: JGwenyth Ober MD;  [facility]  Service: General;  Laterality: Right;  Right Breast   . Irrigation and debridement abscess  11/17/2011    Procedure: IRRIGATION AND DEBRIDEMENT ABSCESS;  Surgeon: MImogene Burn TGeorgette Dover MD;  [facility];  Service: General;  Laterality: Right;  irrigation and debridement right recurrent breast abscess  . Breast surgery Right     I&D for multiple abscesses  . Eye surgery Left     laser surgery  . Larynx surgery    . Ployp removed from voice box 03/30/12    . Incision and drainage abscess Right 05/09/2012    Procedure: INCISION AND DRAINAGE RIGHT BREAST ABSCESS;  Surgeon: MImogene Burn TGeorgette Dover MD;  [facility]  Service: General;  Laterality: Right;  . Nm myoview ltd  01/26/2009    Normal study, no evidence of ischemia, EF 67%  . Cutting balloon    . Left heart catheterization with coronary angiogram N/A 05/29/2012    Procedure:  LEFT HEART CATHETERIZATION WITH CORONARY ANGIOGRAM & PTCA;  Surgeon: Troy Sine, MD;  Location[facility] C[facility] LAB;  Service: Cardiovascular; Bifurcation dRCA-RPAD/PLA & rPDA  PTCA.  Marland Kitchen Left heart catheterization with coronary angiogram N/A 11/08/2013    Procedure: LEFT HEART CATHETERIZATION WITH CORONARY ANGIOGRAM and Coronary Stent Intervention;  Surgeon: Leonie Man, MD;  Location: [facility] CATH LAB;  Service: Cardiovascular; mLAD 80% (FFR 0.73) - resolute DES 3.0 x 22 mm (postdilated to 3.3 mm->3.5 mm)  . Incision and drainage abscess Right 02/08/2014    Procedure: INCISION AND DRAINAGE RIGHT BREAST ABSCESS;  Surgeon: Donnie Mesa, MD;  Location: [place];  Service: General;  Laterality: Right;  . Incision and drainage  abscess Right 06/14/2014    Procedure: INCISION AND DRAINAGE RIGHT BREAST ABSCESS;  Surgeon: Donnie Mesa, MD;  Location: [place];  Service: General;  Laterality: Right;   Family History  Problem Relation Age of Onset  . COPD Mother   . Cancer Mother     lymphoma  . Heart disease Mother   . Cancer Maternal Aunt     breast, colon  . Hyperlipidemia Father   . Heart disease Father   . Hypertension Brother   . Hypertension Maternal Grandmother   . Diabetes Maternal Grandmother    Social History  Substance Use Topics  . Smoking status: Current Every Day Smoker -- 0.25 packs/day for 30 years    Types: Cigarettes  . Smokeless tobacco: Never Used     Comment: 08/14/11 "quit for 3 wk 05/2011; was smoking 1 ppd since age 85 before I quit; at least I've cut down to 1/4 ppd"  . 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  Gastrointestinal: Positive for abdominal pain and diarrhea. Negative for vomiting and blood in stool.  Genitourinary: Negative for dysuria and hematuria.  Musculoskeletal: Positive for back pain.  All other systems reviewed and are negative.  Allergies  Amoxicillin  Home Medications   Prior to Admission medications   Medication Sig Start Date End Date Taking? Authorizing Provider  acyclovir (ZOVIRAX) 400 MG tablet TAKE 1 TABLET BY MOUTH THREE TIMES DAILY. 11/06/14  Yes Shelly Bombard, MD  Albiglutide (TANZEUM) 50 MG PEN Inject 50 mg weekly under skin 03/20/15  Yes Philemon Kingdom, MD  albuterol (PROVENTIL HFA;VENTOLIN HFA) 108 (90 Base) MCG/ACT inhaler Inhale 1-2 puffs into the lungs every 6 (six) hours as needed for wheezing or shortness of breath. 03/27/15  Yes [place], PA-C  aspirin EC 81 MG tablet Take 81 mg by mouth daily.   Yes Historical Provider, MD   carvedilol (COREG) 6.25 MG tablet Take 1 tablet (6.25 mg total) by mouth 2 (two) times daily with a meal. 04/15/15  Yes Leonie Man, MD  clopidogrel (PLAVIX) 75 MG tablet Take 1 tablet (75 mg total) by mouth daily. 04/15/15  Yes Leonie Man, MD  Cyanocobalamin (B-12 PO) Take 1 tablet by mouth daily.    Yes Historical Provider, MD  fluticasone (FLONASE) 50 MCG/ACT nasal spray Place 1 spray into both nostrils daily as needed for allergies.  12/11/13  Yes Historical Provider, MD  furosemide (LASIX) 20 MG tablet Take 20 mg by mouth daily. 04/10/15  Yes Historical  Provider, MD  gabapentin (NEURONTIN) 300 MG capsule Take 600 mg by mouth 2 (two) times daily.    Yes Historical Provider, MD  guaiFENesin 200 MG tablet Take 1 tablet (200 mg total) by mouth every 4 (four) hours as needed for cough or to loosen phlegm. 03/27/15  Yes Alexandra M Law, PA-C  insulin aspart (NOVOLOG FLEXPEN) 100 UNIT/ML FlexPen Inject 10-12 Units into the skin 3 (three) times daily with meals. *Plus sliding scale. 03/20/15  Yes Philemon Kingdom, MD  Insulin Detemir (LEVEMIR FLEXTOUCH) 100 UNIT/ML Pen Inject 20 Units into the skin daily at 10 pm. 03/20/15  Yes Philemon Kingdom, MD  isosorbide mononitrate (IMDUR) 30 MG 24 hr tablet Take 0.5 tablets (15 mg total) by mouth daily. 04/15/15  Yes Leonie Man, MD  lisinopril (PRINIVIL,ZESTRIL) 10 MG tablet Take 5 mg by mouth daily.    Yes Historical Provider, MD  metFORMIN (GLUCOPHAGE-XR) 500 MG 24 hr tablet Take 1 tablet (500 mg total) by mouth daily with supper. 03/20/15  Yes Philemon Kingdom, MD  nitroGLYCERIN (NITROSTAT) 0.4 MG SL tablet Place 1 tablet (0.4 mg total) under the tongue every 5 (five) minutes as needed for chest pain. 04/15/15  Yes Leonie Man, MD  omeprazole (PRILOSEC) 20 MG capsule Take 20 mg by mouth daily.   Yes Historical Provider, MD  PARoxetine (PAXIL) 20 MG tablet Take 20 mg by mouth daily. 05/15/15  Yes Historical Provider, MD  simvastatin (ZOCOR) 40 MG tablet Take 1  tablet (40 mg total) by mouth daily at 6 PM. 11/09/13  Yes Eileen [place], PA-C  terconazole (TERAZOL 7) 0.4 % vaginal cream Place 1 applicator vaginally at bedtime. 12/27/14  Yes Rachelle A Denney, CNM  traZODone (DESYREL) 100 MG tablet Take 50 mg by mouth at bedtime as needed for sleep.    Yes Historical Provider, MD  triamcinolone cream (KENALOG) 0.1 % Apply 1 application topically as needed (eczema).  08/22/13  Yes Historical Provider, MD  ACCU-CHEK AVIVA PLUS test strip TEST BLOOD SUGAR THREE TIMES DAILY AS DIRECTED 01/17/15   Philemon Kingdom, MD  cephALEXin (KEFLEX) 500 MG capsule Take 1 capsule (500 mg total) by mouth 2 (two) times daily. 05/22/15   Merryl Hacker, MD  Insulin Pen Needle 32G X 4 MM MISC Use to inject insulin 4 times daily. 03/20/15   Philemon Kingdom, MD  Insulin Syringe-Needle U-100 (INSULIN SYRINGE 1CC/31GX5/16") 31G X 5/16" 1 ML MISC USE AS DIRECTED FOUR TIMES DAILY FOR INSULIN INJECTIONS. 04/28/15   Philemon Kingdom, MD  nitrofurantoin, macrocrystal-monohydrate, (MACROBID) 100 MG capsule Take 1 capsule (100 mg total) by mouth 2 (two) times daily. For 3 days. 05/13/15   Philemon Kingdom, MD  phenazopyridine (PYRIDIUM) 100 MG tablet Take 1 tablet (100 mg total) by mouth 3 (three) times daily with meals. 05/22/15   Merryl Hacker, MD   BP 121/68 mmHg  Pulse 77  Temp(Src) 97.8 F (36.6 C) (Oral)  Resp 18  SpO2 100% Physical Exam  Constitutional: She is oriented to person, place, and time. She appears well-developed and well-nourished.  Morbidly obese  HENT:  Head: Normocephalic and atraumatic.  Cardiovascular: Normal rate, regular rhythm and normal heart sounds.   No murmur heard. Pulmonary/Chest: Effort normal and breath sounds normal. No respiratory distress. She has no wheezes.  Abdominal: Soft. Bowel sounds are normal. There is tenderness. There is no rebound and no guarding.  Suprapubic and left lower quadrant tenderness to palpation without rebound or guarding   Neurological: She is alert  and oriented to person, place, and time.  Skin: Skin is warm and dry.  Psychiatric: She has a normal mood and affect.  Nursing note and vitals reviewed.   ED Course  Procedures  DIAGNOSTIC STUDIES:  Oxygen Saturation is 98% on RA, normal by my interpretation.    COORDINATION OF CARE:  12:49 AM Will administer pain and nausea medication. Will order CT A/P, UA, CBC, CMP, and lipase. Discussed treatment plan with pt at bedside and pt agreed to plan.  Labs Review Labs Reviewed  COMPREHENSIVE METABOLIC PANEL - Abnormal; Notable for the following:    AST 12 (*)    ALT 11 (*)    All other components within normal limits  CBC - Abnormal; Notable for the following:    WBC 13.5 (*)    All other components within normal limits  URINALYSIS, ROUTINE W REFLEX MICROSCOPIC (NOT AT [facility]) - Abnormal; Notable for the following:    APPearance CLOUDY (*)    Nitrite POSITIVE (*)    All other components within normal limits  URINE MICROSCOPIC-ADD ON - Abnormal; Notable for the following:    Squamous Epithelial / LPF 0-5 (*)    Bacteria, UA MANY (*)    All other components within normal limits  URINE CULTURE  LIPASE, BLOOD    Imaging Review Ct Abdomen Pelvis W Contrast  05/22/2015  CLINICAL DATA:  E coli positive urinary tract infection. Left lower quadrant pain for EXAM: CT ABDOMEN AND PELVIS WITH CONTRAST TECHNIQUE: Multidetector CT imaging of the abdomen and pelvis was performed using the standard protocol following bolus administration of intravenous contrast. CONTRAST:  100 mL Isovue 370 intravenous COMPARISON:  None. FINDINGS: Lower chest: Mild atelectatic appearing opacities in both posterior lung bases. Hepatobiliary: There are normal appearances of the liver, gallbladder and bile ducts. Pancreas: Normal Spleen: Normal Adrenals/Urinary Tract: The adrenals and kidneys are normal in appearance. There is no urinary calculus evident. There is no hydronephrosis or  ureteral dilatation. Collecting systems and ureters appear unremarkable. Urinary bladder exhibits moderate mural thickening, perhaps related to the history of recent cystitis. No focal urinary bladder abnormality. No urinary bladder air. Stomach/Bowel: There are normal appearances of the stomach, small bowel and colon. The appendix is normal. Vascular/Lymphatic: The abdominal aorta is normal in caliber. There is mild atherosclerotic calcification. There is no adenopathy in the abdomen or pelvis. Reproductive: Uterus and ovaries are unremarkable. Other: No acute inflammatory changes are evident in the abdomen or pelvis. There is no ascites. Incidental findings include a small fat containing umbilical hernia. Musculoskeletal: No significant musculoskeletal abnormality. IMPRESSION: Mild uniform mural thickening of the urinary bladder, perhaps related to the history of recent cystitis. No focal bladder abnormality. Otherwise unremarkable abdomen and pelvis. Electronically Signed   By: Andreas Newport M.D.   On: 05/22/2015 02:19   I have personally reviewed and evaluated these images and lab results as part of my medical decision-making.   EKG Interpretation None      MDM   Final diagnoses:  Lower abdominal pain  UTI (lower urinary tract infection)    Patient presents with lower abdominal suprapubic and left lower quadrant pain. Nontoxic on exam. Mildly tachycardic. Tender without signs of peritonitis. Recent history of urinary tract infection treated with Macrobid per chart review. It was pansensitive Escherichia coli. Patient reports continued foul-smelling urine. Otherwise denies dysuria. Possible causes of pain include persistent UTI, kidney stone, diverticulitis given diarrhea. Patient was given pain and nausea medication. Basic labwork obtained.  Patient has leukocytosis  to 13.5. Otherwise urinalysis continues to be nitrite positive with many bacteria. 0-5 white cells.  Patient is afebrile and has  no flank tenderness. However, will obtain CT scan to evaluate for possible complicated pyelonephritis versus diverticulitis. CT scan just shows bladder wall thickening. No evidence of diverticulitis or kidney involvement. Patient was given Pyridium and Percocet. She was also given IV Rocephin. Repeat culture is pending. Will discharge home on a seven-day course of Keflex.  I personally performed the services described in this documentation, which was scribed in my presence. The recorded information has been reviewed and is accurate.    Merryl Hacker, MD 05/22/15 [phone]

## 2015-05-22 NOTE — ED Notes (Signed)
Pt transported to CT ?

## 2015-05-22 NOTE — ED Notes (Signed)
Bed: TX52 Expected date:  Expected time:  Means of arrival:  Comments: EMS 50 yo female lower abdominal pain since 2130-hx UTI

## 2015-05-22 NOTE — ED Notes (Signed)
Pt BIB [facility] c/o lower abdominal pain. Hx of recent UTI, just finished antibiotic. UTI was E.Coli positive. Pain started at 2130 and has progressed to 10/10. Hx diabetes and stent placement. Denies N/V, and chest pain. Endorses constipation and SOB. A&Ox4. Ambulatory.

## 2015-05-22 NOTE — Discharge Instructions (Signed)

## 2015-05-24 LAB — URINE CULTURE

## 2015-05-25 ENCOUNTER — Telehealth (HOSPITAL_BASED_OUTPATIENT_CLINIC_OR_DEPARTMENT_OTHER): Payer: Self-pay

## 2015-05-25 NOTE — Telephone Encounter (Signed)
Post ED Visit - Positive Culture Follow-up  Culture report reviewed by antimicrobial stewardship pharmacist:  '[x]'$  Elenor Quinones, Pharm.D. '[]'$  Heide Guile, Pharm.D., BCPS '[]'$  Parks Neptune, Pharm.D. '[]'$  Alycia Rossetti, Pharm.D., BCPS '[]'$  Merriam Woods, Pharm.D., BCPS, AAHIVP '[]'$  Legrand Como, Pharm.D., BCPS, AAHIVP '[]'$  Milus Glazier, Pharm.D. '[]'$  Stephens November, Pharm.D.  Positive urine culture Treated with Cephalexin, organism sensitive to the same and no further patient follow-up is required at this time.  Genia Del 05/25/2015, 12:58 PM

## 2015-06-11 ENCOUNTER — Other Ambulatory Visit: Payer: Self-pay | Admitting: Physician Assistant

## 2015-06-11 ENCOUNTER — Other Ambulatory Visit: Payer: Self-pay | Admitting: Internal Medicine

## 2015-06-11 NOTE — Telephone Encounter (Signed)
Rx(s) sent to pharmacy electronically.  

## 2015-06-19 ENCOUNTER — Ambulatory Visit: Payer: Medicaid Other | Admitting: Internal Medicine

## 2015-07-02 ENCOUNTER — Encounter: Payer: Self-pay | Admitting: Internal Medicine

## 2015-07-02 ENCOUNTER — Ambulatory Visit (INDEPENDENT_AMBULATORY_CARE_PROVIDER_SITE_OTHER): Payer: Medicaid Other | Admitting: Internal Medicine

## 2015-07-02 VITALS — BP 160/102 | HR 96 | Ht 67.5 in | Wt 252.6 lb

## 2015-07-02 DIAGNOSIS — E1121 Type 2 diabetes mellitus with diabetic nephropathy: Secondary | ICD-10-CM

## 2015-07-02 DIAGNOSIS — I10 Essential (primary) hypertension: Secondary | ICD-10-CM

## 2015-07-02 LAB — POCT GLYCOSYLATED HEMOGLOBIN (HGB A1C): HEMOGLOBIN A1C: 7.3

## 2015-07-02 NOTE — Progress Notes (Signed)
Patient ID: Cassidy [name], female   DOB: 1965/08/26, 50 y.o.   MRN: [id number] ,  HPI: Cassidy [name] is a 50 y.o.-year-old female, initially referred by her PCP, Dellie Catholic, for management of DM2, dx 2005, insulin-dependent since 05/2012, uncontrolled, with complications (CAD, peripheral neuropathy). Last visit 2 mo ago.   Last hemoglobin A1c was: Lab Results  Component Value Date   HGBA1C 8.2 03/20/2015   HGBA1C 6.9* 11/29/2014   HGBA1C 6.5* 02/08/2014   Pt is on a regimen of: - Metformin XR 500 mg at dinnertime >> nausea, diarrhea if takes it in am >> at dinnertime  - Tanzeum 50 mg under skin once a week  - Levemir 20 >> 30 units at bedtime - Novolog 10-12 units 3x a day - NovoLog SSI:  - 150-175: + 1 unit  - 176-200: + 2 units  - 201-225: + 3 units  - 226-250: + 4 units  - >250: + 5 units  We added Invokana 100 mg 10/2014 >> pt did not return for a visit after this as advised. She had severe yeast infections >> had to stop. Was on Regular Metformin >> diarrhea.   Pt checks her sugars 3 a day and they are better, especially in last 2 week: - am: 134-179, 201, 204 >> 150-190s, 220 >> 141-190s (prev. 100-112) >> 155, 182-297 >> 131-159, 194, 212 - 2h after b'fast: 79-139 >> n/c - before lunch: 111-167 >> 88, 92, 98, 146 >> 120-183, 231 >> 140-170 >> 130-190 >> 223, 242 >> 123 - 2h after lunch: not checking >> 217 - before dinner: 103-126 >> 102-113 >> 148-149, 181 >> 150-180s >> >> 130-235 >> 141, 239-243 >> n/c - 2h after dinner:120-212 >> 124, 139  >> n/c >> 194, 187-247 >> 110-190, 225 - bedtime:147-223 >>  171, 178, 202, 274 x 1 >> 150, 181-213 >> 220s >> 300s,  But no recent checks >> 123-183 - nighttime: not checking AccuChek.  No lows. Lowest sugar was 120 >> 83 (pm) >> 130 >> 120; she has hypoglycemia awareness at 90s. Highest sugar was 231 >> 258 >> 300s >> 234.  Pt's meals are - per last visit's review: - Breakfast: glass of skim milk, [place] sausage (not  bacon anymore), grits - 1 cup, toast - Lunch: salads and soups ([employer]) - Dinner: pinto beans, casseroles, [place] and chicken meat; spaghetti Walks after dinner. - Snacks: baby carrots, jello  - no CKD, last BUN/creatinine:  Lab Results  Component Value Date   BUN 14 05/22/2015   CREATININE 0.59 05/22/2015  She is on Lisinopril 10.  - last set of lipids: Lab Results  Component Value Date   CHOL 171 11/29/2014   HDL 47.90 11/29/2014   LDLCALC 99 11/29/2014   TRIG 119.0 11/29/2014   CHOLHDL 4 11/29/2014  She is on Lipitor 20. She is on ASA 81. - last eye exam was in 05/2015 (Dr. Prudencio Burly). 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 - + numbness and tingling in her feet >> was on Neurontin 600 mg bid ([facility] would not approve more) >> on Neurontin 300 bid + Lyrica 75 bid.   She also has a history of bipolar disorder, HL, GERD, back pain.  She still complains of foul smelling urine.  ROS: Constitutional: no weight gain, no fatigue Eyes: no blurry vision, no xerophthalmia  ENT: no sore throat, no nodules palpated in throat, no dysphagia/odynophagia, no hoarseness Cardiovascular: + CP x1 /no SOB/palpitations/leg swelling Respiratory:no cough/SOB/wheezing  Gastrointestinal: no N/V/D/+ C Musculoskeletal: no muscle/joint aches Skin: no rashes, + hair loss Neurological: no tremors/numbness/tingling/dizziness  I reviewed pt's medications, allergies, PMH, social hx, family hx, and changes were documented in the history of present illness. Otherwise, unchanged from my initial visit note.  PE: BP 160/102 mmHg  Pulse 96  Ht 5' 7.5" (1.715 m)  Wt 252 lb 9.6 oz (114.579 kg)  BMI 38.96 kg/m2  SpO2 97%  LMP 05/05/2015 Body mass index is 38.96 kg/(m^2). On repeat BP: 170/110. Wt Readings from Last 3 Encounters:  07/02/15 252 lb 9.6 oz (114.579 kg)  05/08/15 247 lb (112.038 kg)  04/15/15 249 lb 6.4 oz (113.127 kg)   Constitutional: overweight, in NAD Eyes: PERRLA, EOMI, no  exophthalmos ENT: moist mucous membranes, no thyromegaly, no cervical lymphadenopathy Cardiovascular: tachycardia, RR, No MRG Respiratory: CTA B Gastrointestinal: abdomen soft, NT, ND, BS+ Musculoskeletal: no deformities, strength intact in all 4 Skin: moist, warm, no rashes  ASSESSMENT: 1. DM2, insulin-dependent, uncontrolled, with complications - CAD - s/p AMI 05/2012, s/p PTCA to the RCA and DES to LAD Had normal lower arterial duplex study 08/03/2012. - peripheral neuropathy (on Neurontin)  2. HTN  PLAN:  1. Patient with long-standing, uncontrolled diabetes (last HbA1c was higher, at 8.2% as he was out of most meds, including insulin for few months before last visit (insurance pbs). We restarted her meds and increased her Tanzeum. She is now compliant with her regimen, except for the last 1.5 mo when she was busy with her son's graduation. However, sugars better in last 2 weeks! - will continue current regimen: Patient Instructions  Please increase the Metformin ER to 1000 mg daily with dinner. Continue: - Tanzeum 50 mg under skin once a week  - Levemir 30 units at bedtime - Novolog 10-12 units 3x a day - NovoLog SSI:  - 150-175: + 1 unit  - 176-200: + 2 units  - 201-225: + 3 units  - 226-250: + 4 units  - >250: + 5 units   Please return in 3 months with your sugar log.   - continue checking CBGs 3x times a day, rotating checks - UTD yearly eye exams - given her the flu vaccine this season - checked HbA1c today >> 7.3% (better!) - Return to clinic in 3 mo with sugar log   2. HTN - BP higher. She took the Lasix but not the Lisinopril today - advised her to take the Lisinopril as soon as she gets home - she will go to her pharmacy to recheck BP (it was higher on repeat here: 170/110) - if persists in being high >> advised to let her cardiologist know especially since she also had CP 2 days ago

## 2015-07-02 NOTE — Addendum Note (Signed)
Addended by: Ena Dawley on: 07/02/2015 09:38 AM   Modules accepted: Orders

## 2015-07-02 NOTE — Patient Instructions (Signed)
Please increase the Metformin ER to 1000 mg daily with dinner. Continue: - Tanzeum 50 mg under skin once a week  - Levemir 30 units at bedtime - Novolog 10-12 units 3x a day - NovoLog SSI:  - 150-175: + 1 unit  - 176-200: + 2 units  - 201-225: + 3 units  - 226-250: + 4 units  - >250: + 5 units   Please return in 3 months with your sugar log.

## 2015-07-09 ENCOUNTER — Other Ambulatory Visit: Payer: Self-pay | Admitting: Internal Medicine

## 2015-07-25 ENCOUNTER — Encounter: Payer: Self-pay | Admitting: Gastroenterology

## 2015-08-12 ENCOUNTER — Other Ambulatory Visit: Payer: Self-pay | Admitting: Internal Medicine

## 2015-08-12 ENCOUNTER — Other Ambulatory Visit: Payer: Self-pay | Admitting: Obstetrics

## 2015-08-12 DIAGNOSIS — B373 Candidiasis of vulva and vagina: Secondary | ICD-10-CM

## 2015-08-12 DIAGNOSIS — B3731 Acute candidiasis of vulva and vagina: Secondary | ICD-10-CM

## 2015-08-12 MED ORDER — ALBIGLUTIDE 50 MG ~~LOC~~ PEN: PEN_INJECTOR | SUBCUTANEOUS | 1 refills | Status: DC

## 2015-08-12 MED ORDER — METFORMIN HCL ER 500 MG PO TB24
1000.0000 mg | ORAL_TABLET | Freq: Every day | ORAL | 1 refills | Status: DC
Start: 2015-08-12 — End: 2020-04-24

## 2015-08-12 MED ORDER — ALBIGLUTIDE 50 MG ~~LOC~~ PEN
PEN_INJECTOR | SUBCUTANEOUS | 1 refills | Status: DC
Start: 2015-08-12 — End: 2015-08-12

## 2015-08-19 ENCOUNTER — Other Ambulatory Visit: Payer: Self-pay | Admitting: Internal Medicine

## 2015-09-30 ENCOUNTER — Ambulatory Visit: Payer: Medicaid Other | Admitting: Gastroenterology

## 2015-10-02 ENCOUNTER — Ambulatory Visit: Payer: Medicaid Other | Admitting: Internal Medicine

## 2015-10-02 DIAGNOSIS — Z0289 Encounter for other administrative examinations: Secondary | ICD-10-CM

## 2015-10-13 ENCOUNTER — Emergency Department (HOSPITAL_COMMUNITY): Payer: Medicaid Other

## 2015-10-13 ENCOUNTER — Encounter (HOSPITAL_COMMUNITY): Payer: Self-pay

## 2015-10-13 ENCOUNTER — Emergency Department (HOSPITAL_COMMUNITY)
Admission: EM | Admit: 2015-10-13 | Discharge: 2015-10-14 | Disposition: A | Discharge status: Home / Self Care | Payer: Medicaid Other | Attending: Emergency Medicine | Admitting: Emergency Medicine

## 2015-10-13 DIAGNOSIS — J441 Chronic obstructive pulmonary disease with (acute) exacerbation: Secondary | ICD-10-CM | POA: Diagnosis not present

## 2015-10-13 DIAGNOSIS — F17210 Nicotine dependence, cigarettes, uncomplicated: Secondary | ICD-10-CM | POA: Insufficient documentation

## 2015-10-13 DIAGNOSIS — Z794 Long term (current) use of insulin: Secondary | ICD-10-CM | POA: Diagnosis not present

## 2015-10-13 DIAGNOSIS — R05 Cough: Secondary | ICD-10-CM

## 2015-10-13 DIAGNOSIS — Z7984 Long term (current) use of oral hypoglycemic drugs: Secondary | ICD-10-CM | POA: Diagnosis not present

## 2015-10-13 DIAGNOSIS — E1121 Type 2 diabetes mellitus with diabetic nephropathy: Secondary | ICD-10-CM | POA: Diagnosis not present

## 2015-10-13 DIAGNOSIS — Z955 Presence of coronary angioplasty implant and graft: Secondary | ICD-10-CM | POA: Insufficient documentation

## 2015-10-13 DIAGNOSIS — Z7982 Long term (current) use of aspirin: Secondary | ICD-10-CM | POA: Diagnosis not present

## 2015-10-13 DIAGNOSIS — E1140 Type 2 diabetes mellitus with diabetic neuropathy, unspecified: Secondary | ICD-10-CM | POA: Diagnosis not present

## 2015-10-13 DIAGNOSIS — I2510 Atherosclerotic heart disease of native coronary artery without angina pectoris: Secondary | ICD-10-CM | POA: Diagnosis not present

## 2015-10-13 DIAGNOSIS — R059 Cough, unspecified: Secondary | ICD-10-CM

## 2015-10-13 DIAGNOSIS — I10 Essential (primary) hypertension: Secondary | ICD-10-CM | POA: Insufficient documentation

## 2015-10-13 MED ORDER — PREDNISONE 10 MG PO TABS: 40.0000 mg | ORAL_TABLET | Freq: Every day | ORAL | 0 refills | Status: AC

## 2015-10-13 MED ORDER — AZITHROMYCIN 250 MG PO TABS: 250.0000 mg | ORAL_TABLET | Freq: Every day | ORAL | 0 refills | Status: DC

## 2015-10-13 MED ORDER — ALBUTEROL SULFATE (2.5 MG/3ML) 0.083% IN NEBU
INHALATION_SOLUTION | RESPIRATORY_TRACT | Status: AC
  Filled 2015-10-13: qty 6

## 2015-10-13 MED ORDER — PREDNISONE 20 MG PO TABS
60.0000 mg | ORAL_TABLET | Freq: Once | ORAL | Status: AC
  Administered 2015-10-13: 60 mg via ORAL
  Filled 2015-10-13: qty 3

## 2015-10-13 MED ORDER — IPRATROPIUM-ALBUTEROL 0.5-2.5 (3) MG/3ML IN SOLN
3.0000 mL | Freq: Once | RESPIRATORY_TRACT | Status: AC
  Administered 2015-10-13: 3 mL via RESPIRATORY_TRACT
  Filled 2015-10-13: qty 3

## 2015-10-13 MED ORDER — ALBUTEROL SULFATE (2.5 MG/3ML) 0.083% IN NEBU
5.0000 mg | INHALATION_SOLUTION | Freq: Once | RESPIRATORY_TRACT | Status: AC
  Administered 2015-10-13: 5 mg via RESPIRATORY_TRACT

## 2015-10-13 NOTE — ED Provider Notes (Signed)
[facility] DEPT Provider Note   CSN: [id number] Arrival date & time: 10/13/15  1520     History   Chief Complaint Chief Complaint  Patient presents with  . Cough    HPI Cassidy [name] is a 50 y.o. female.   Cough  This is a new problem. The current episode started 2 days ago. The problem occurs every few minutes. The problem has not changed since onset.The cough is productive of sputum. Maximum temperature: subjective. Associated symptoms include chest pain (2/2 coughing), headaches and wheezing. Pertinent negatives include no chills, no sweats, no ear pain, no sore throat and no shortness of breath. Treatments tried: albuterol. The treatment provided mild relief. She is a smoker. Her past medical history is significant for COPD.    Past Medical History:  Diagnosis Date  . Anemia   . Atherosclerotic heart disease of native coronary artery with angina pectoris ([place]) 05/2012, 10/2013   a.  s/p PCTA to dRCA and ostial RPAV-PLA vessel + PDA branch (05/2012)  b. [place] s/p DES to mLAD - Resolute DES 3.0 x 22 (3.33m -->3.3 mm)  . Bipolar depression ([place]   . Breast abscess    a. right side.   . Depression   . Diabetic peripheral neuropathy ([place]   . Fibromyalgia   . Genital warts   . GERD (gastroesophageal reflux disease)   . HLD (hyperlipidemia)   . Hypertension   . Pain in limb    a. LE VENOUS DUPLEX, 02/05/2009 - no evidence of deep vein thrombosis, Baker's cyst  . PTSD (post-traumatic stress disorder)   . Sickle cell trait ([facility]   . Tobacco abuse   . Tooth caries   . Type II diabetes mellitus ([facility]     Patient Active Problem List   Diagnosis Date Noted  . Hyperlipidemia with target LDL less than 70   . Type II diabetes mellitus with nephropathy ([facility]   . Fibromyalgia   . Diabetic peripheral neuropathy ([facility]   . Bipolar depression ([place]   . PTSD (post-traumatic stress disorder)   . CAD S/P percutaneous coronary angioplasty 11/02/2013  . Leiomyoma of uterus,  unspecified 07/20/2012  . BV (bacterial vaginosis) 07/20/2012  . Breast pain, right 07/17/2012  . Essential hypertension 05/26/2012  . Tobacco abuse 05/26/2012  . Unstable angina pectoris ([place] 05/26/2012  . Family history of early CAD 05/26/2012  . Perirectal abscess 03/14/2012  . Abscess of right breast 12/18/2010  . CARPAL TUNNEL SYNDROME, LEFT 12/18/2009  . BREAST MASS, RIGHT 08/13/2008  . INGROWN HAIR 05/03/2008  . TINEA VERSICOLOR 04/06/2007  . ABDOMINAL/PELVIC SWELLING MASS/LUMP EPIGASTRIC 04/06/2007  . BIPOLAR I D/O MOST RECENT EPIS DEPRESSED MOD 11/24/2006  . HERPES, GENITAL NEC 09/30/2006  . SUBSTANCE ABUSE 09/30/2006  . GERD 09/30/2006  . PLANTAR FASCIITIS 09/30/2006    Past Surgical History:  Procedure Laterality Date  . BLADDER SURGERY  1980   "TVT"  . BREAST SURGERY Right    I&D for multiple abscesses  . cutting balloon    . EYE SURGERY Left    laser surgery  . INCISE AND DRAIN ABCESS  ; 04/26/11; 08/13/11   right breast  . INCISION AND DRAINAGE ABSCESS Right 05/09/2012   Procedure: INCISION AND DRAINAGE RIGHT BREAST ABSCESS;  Surgeon: MImogene Burn TGeorgette Dover MD;  [place]  Service: General;  Laterality: Right;  . INCISION AND DRAINAGE ABSCESS Right 02/08/2014   Procedure: INCISION AND DRAINAGE RIGHT BREAST ABSCESS;  Surgeon: MDonnie Mesa MD;  [place]  Service: General;  Laterality: Right;  . INCISION AND DRAINAGE ABSCESS Right 06/14/2014   Procedure: INCISION AND DRAINAGE RIGHT BREAST ABSCESS;  Surgeon: Donnie Mesa, MD;  Location: [place];  Service: General;  Laterality: Right;  . IRRIGATION AND DEBRIDEMENT ABSCESS  12/19/2010   Procedure: IRRIGATION AND DEBRIDEMENT ABSCESS;  Surgeon: Rolm Bookbinder, MD;  Location: [place];  Service: General;  Laterality: Right;  . IRRIGATION AND DEBRIDEMENT ABSCESS  08/13/2011   Procedure: MINOR INCISION AND DRAINAGE OF ABSCESS;  Surgeon: Gwenyth Ober, MD;  Location: [place];  Service: General;  Laterality: Right;  Right  Breast   . IRRIGATION AND DEBRIDEMENT ABSCESS  11/17/2011   Procedure: IRRIGATION AND DEBRIDEMENT ABSCESS;  Surgeon: Imogene Burn. Georgette Dover, MD;  Location: [facility];  Service: General;  Laterality: Right;  irrigation and debridement right recurrent breast abscess  . LARYNX SURGERY    . LEFT HEART CATHETERIZATION WITH CORONARY ANGIOGRAM N/A 05/29/2012   Procedure: LEFT HEART CATHETERIZATION WITH CORONARY ANGIOGRAM & PTCA;  Surgeon: Troy Sine, MD;  Location: [facility] CATH LAB;  Service: Cardiovascular; Bifurcation dRCA-RPAD/PLA & rPDA  PTCA.  Marland Kitchen LEFT HEART CATHETERIZATION WITH CORONARY ANGIOGRAM N/A 11/08/2013   Procedure: LEFT HEART CATHETERIZATION WITH CORONARY ANGIOGRAM and Coronary Stent Intervention;  Surgeon: Leonie Man, MD;  Location: [facility] CATH LAB;  Service: Cardiovascular; mLAD 80% (FFR 0.73) - resolute DES 3.0 x 22 mm (postdilated to 3.3 mm->3.5 mm)  . NM MYOVIEW LTD  01/26/2009   Normal study, no evidence of ischemia, EF 67%  . ployp removed from voice box 03/30/12    . REFRACTIVE SURGERY  ~ 2010   right  . TONSILLECTOMY  1988    OB History    Gravida Para Term Preterm AB Living   '1 1 1     1   '$ SAB TAB Ectopic Multiple Live Births                   Home Medications    Prior to Admission medications   Medication Sig Start Date End Date Taking? Authorizing Provider  ACCU-CHEK AVIVA PLUS test strip TEST BLOOD SUGAR THREE TIMES DAILY AS DIRECTED 01/17/15   Philemon Kingdom, MD  acyclovir (ZOVIRAX) 400 MG tablet TAKE 1 TABLET BY MOUTH THREE TIMES DAILY. 11/06/14   Shelly Bombard, MD  Albiglutide (TANZEUM) 50 MG PEN Inject 50 mg weekly under skin 08/12/15   Philemon Kingdom, MD  albuterol (PROVENTIL HFA;VENTOLIN HFA) 108 (90 Base) MCG/ACT inhaler Inhale 1-2 puffs into the lungs every 6 (six) hours as needed for wheezing or shortness of breath. 03/27/15   Frederica Kuster, PA-C  aspirin EC 81 MG tablet Take 81 mg by mouth daily.    Historical Provider, MD  carvedilol (COREG) 6.25 MG tablet Take  1 tablet (6.25 mg total) by mouth 2 (two) times daily with a meal. 04/15/15   Leonie Man, MD  cephALEXin (KEFLEX) 500 MG capsule Take 1 capsule (500 mg total) by mouth 2 (two) times daily. Patient not taking: Reported on 07/02/2015 05/22/15   Merryl Hacker, MD  clopidogrel (PLAVIX) 75 MG tablet Take 1 tablet (75 mg total) by mouth daily. 04/15/15   Leonie Man, MD  Cyanocobalamin (B-12 PO) Take 1 tablet by mouth daily.     Historical Provider, MD  fluconazole (DIFLUCAN) 150 MG tablet TAKE 1 TABLET BY MOUTH ONCE 08/12/15   Shelly Bombard, MD  fluticasone [facility]) 50 MCG/ACT nasal spray Place 1 spray into both nostrils daily  as needed for allergies.  12/11/13   Historical Provider, MD  furosemide (LASIX) 20 MG tablet Take 20 mg by mouth daily. 04/10/15   Historical Provider, MD  gabapentin (NEURONTIN) 300 MG capsule Take 600 mg by mouth 2 (two) times daily.     Historical Provider, MD  gabapentin (NEURONTIN) 600 MG tablet TAKE 1 TABLET BY MOUTH THREE TIMES DAILY 08/19/15   Philemon Kingdom, MD  guaiFENesin 200 MG tablet Take 1 tablet (200 mg total) by mouth every 4 (four) hours as needed for cough or to loosen phlegm. Patient not taking: Reported on 07/02/2015 03/27/15   Frederica Kuster, PA-C  Insulin Detemir (LEVEMIR FLEXTOUCH) 100 UNIT/ML Pen Inject 20 Units into the skin daily at 10 pm. 03/20/15   Philemon Kingdom, MD  Insulin Pen Needle 32G X 4 MM MISC Use to inject insulin 4 times daily. 03/20/15   Philemon Kingdom, MD  Insulin Syringe-Needle U-100 (INSULIN SYRINGE 1CC/31GX5/16") 31G X 5/16" 1 ML MISC USE AS DIRECTED FOUR TIMES DAILY FOR INSULIN INJECTIONS. 04/28/15   Philemon Kingdom, MD  isosorbide mononitrate (IMDUR) 30 MG 24 hr tablet Take 0.5 tablets (15 mg total) by mouth daily. Patient not taking: Reported on 07/02/2015 04/15/15   Leonie Man, MD  lisinopril (PRINIVIL,ZESTRIL) 10 MG tablet Take 5 mg by mouth daily.     Historical Provider, MD  metFORMIN (GLUCOPHAGE-XR) 500 MG 24 hr tablet  Take 2 tablets (1,000 mg total) by mouth daily with supper. 08/12/15   Philemon Kingdom, MD  nitroGLYCERIN (NITROSTAT) 0.4 MG SL tablet Place 1 tablet (0.4 mg total) under the tongue every 5 (five) minutes as needed for chest pain. 04/15/15   Leonie Man, MD  NOVOLOG FLEXPEN 100 UNIT/ML FlexPen INJECT 10-12 UNITS INTO THE SKIN 3 TIMES DAILY WITH MEALS. PLUS SLIDING SCALE. 07/09/15   Philemon Kingdom, MD  omeprazole (PRILOSEC) 20 MG capsule Take 20 mg by mouth daily.    Historical Provider, MD  oxyCODONE-acetaminophen (PERCOCET/ROXICET) 5-325 MG tablet TK 1 T PO QID 06/11/15   Historical Provider, MD  PARoxetine (PAXIL) 20 MG tablet Take 20 mg by mouth daily. 05/15/15   Historical Provider, MD  phenazopyridine (PYRIDIUM) 100 MG tablet Take 1 tablet (100 mg total) by mouth 3 (three) times daily with meals. 05/22/15   Merryl Hacker, MD  simvastatin (ZOCOR) 40 MG tablet TAKE 1 TABLET BY MOUTH ONCE A DAY AT 6:00 PM 06/11/15   Leonie Man, MD  terconazole (TERAZOL 7) 0.4 % vaginal cream Place 1 applicator vaginally at bedtime. 12/27/14   Rachelle A Denney, CNM  traZODone (DESYREL) 100 MG tablet Take 50 mg by mouth at bedtime as needed for sleep.     Historical Provider, MD  triamcinolone cream (KENALOG) 0.1 % Apply 1 application topically as needed (eczema).  08/22/13   Historical Provider, MD    Family History Family History  Problem Relation Age of Onset  . COPD Mother   . Cancer Mother     lymphoma  . Heart disease Mother   . Hyperlipidemia Father   . Heart disease Father   . Hypertension Maternal Grandmother   . Diabetes Maternal Grandmother   . Cancer Maternal Aunt     breast, colon  . Hypertension Brother     Social History Social History  Substance Use Topics  . Smoking status: Current Every Day Smoker    Packs/day: 0.25    Years: 30.00    Types: Cigarettes  . Smokeless tobacco: Never Used  Comment: 08/14/11 "quit for 3 wk 05/2011; was smoking 1 ppd since age 12 before I quit;  at least I've cut down to 1/4 ppd"  . 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 Negative for chills.  HENT: Positive for congestion. Negative for ear pain and sore throat.   Eyes: Negative for pain and visual disturbance.  Respiratory: Positive for cough and wheezing. Negative for shortness of breath.   Cardiovascular: Positive for chest pain (2/2 coughing). Negative for palpitations.  Gastrointestinal: Negative for abdominal pain and vomiting.  Genitourinary: Negative for dysuria and hematuria.  Musculoskeletal: Negative for arthralgias and back pain.  Skin: Negative for color change and rash.  Neurological: Positive for headaches. Negative for seizures and syncope.  All other systems reviewed and are negative.    Physical Exam Updated Vital Signs BP 147/92   Pulse 95   Temp 97.8 F (36.6 C) (Oral)   Resp 18   Ht 5' 7.5" (1.715 m)   Wt 113.9 kg   SpO2 100%   BMI 38.73 kg/m   Physical Exam  Constitutional: She is oriented to person, place, and time. She appears well-developed and well-nourished. No distress.  HENT:  Head: Normocephalic and atraumatic.  Eyes: Conjunctivae are normal.  Neck: Normal range of motion. Neck supple.  Cardiovascular: Normal rate and regular rhythm.   No murmur heard. Pulmonary/Chest: Effort normal. No respiratory distress. She has wheezes.  Coughing, productive of white sputum  Abdominal: Soft. There is no tenderness.  Musculoskeletal: She exhibits no edema.  Neurological: She is alert and oriented to person, place, and time.  Skin: Skin is warm and dry.  Psychiatric: She has a normal mood and affect.  Nursing note and vitals reviewed.    ED Treatments / Results  Labs (all labs ordered are listed, but only abnormal results are displayed) Labs Reviewed - No data  to display  EKG  EKG Interpretation  Date/Time:  Monday October 13 2015 17:05:13 EDT Ventricular Rate:  96 PR Interval:  172 QRS Duration: 74 QT Interval:  370 QTC Calculation: 467 R Axis:   24 Text Interpretation:  Normal sinus rhythm Anterior infarct , age undetermined Abnormal ECG When compared with ECG of 03/27/2015, No significant change was found Confirmed by [facility]  MD, DAVID ([id number]) on 10/13/2015 9:55:08 PM       Radiology Dg Chest 2 View  Result Date: 10/13/2015 CLINICAL DATA:  Chest tightness under both breasts from coughing radiating to LEFT back, cough productive of clear sputum with shortness of breath for 2 days, fever, nausea and wheezing since last night, history hypertension, diabetes mellitus, asthma EXAM: CHEST  2 VIEW COMPARISON:  03/27/2015 FINDINGS: Normal heart size, mediastinal contours, and pulmonary vascularity. Lungs clear. No pleural effusion or pneumothorax. Bones demineralized. IMPRESSION: No acute abnormalities. Electronically Signed   By: Lavonia Dana M.D.   On: 10/13/2015 17:46    Procedures Procedures (including critical care time)  Medications Ordered in ED Medications  albuterol (PROVENTIL) (2.5 MG/3ML) 0.083% nebulizer solution (not administered)  albuterol (PROVENTIL) (2.5 MG/3ML) 0.083% nebulizer solution 5 mg (5 mg Nebulization Given 10/13/15 1708)     Initial Impression / Assessment and Plan / ED Course  I have reviewed the triage vital signs and the nursing notes.  Pertinent labs & imaging results that were available during my care  of the patient were reviewed by me and considered in my medical decision making (see chart for details).  Clinical Course    Ms. Virgo is a 50 year old female with an extensive past medical history significant for CAD, depression, HLD, HTN, tobacco use, and COPD who presents for 2 days of cough and fever.  Patient has cough with sputum production and mild wheezing.  EKG demonstrates borderline tachycardic  sinus rhythm with no significant change from prior.  Chest x-ray obtained, personally reviewed by me, demonstrates no acute cardiac or pulmonary processes.  Doubt pneumonia.  Patient treated with albuterol with improvement wheezing.  Most likely upper respiratory infection with cough and COPD exacerbation.  Given nontoxic appearance the patient, no shortness of breath, improvement on breathing treatment and subjective fever of only 2 days, patient is deemed a good candidate for discharge.  Patient is given prednisone in the ED.  She is discharged with a prescription for a Z-Pak and continued prednisone.  The patient was given strict precautions, alternatives, and educational materials.   Final Clinical Impressions(s) / ED Diagnoses   Final diagnoses:  Cough  COPD exacerbation ([place])    New Prescriptions Discharge Medication List as of 10/13/2015 11:51 PM    START taking these medications   Details  azithromycin (ZITHROMAX Z-PAK) 250 MG tablet Take 1 tablet (250 mg total) by mouth daily. Take 2 tablets on day 1, then 1 each day for 4 days., Starting Mon 10/13/2015, Until Sat 10/18/2015, Print    predniSONE (DELTASONE) 10 MG tablet Take 4 tablets (40 mg total) by mouth daily., Starting Mon 10/13/2015, Until Wed 10/15/2015, Print         Elveria Rising, MD 56/21/30 [id number]    Delora Fuel, MD 84/69/62 [id number]

## 2015-10-13 NOTE — ED Triage Notes (Signed)
Pt. Has am upper resp. Cough with wheezing.  She has  COPD and has been using her aerosol treatments and inhaler but she is not getting any better.  Pt. Reports having a fever last night and nausea but denies having it this morning.   Pt. Having back pain from her coughing. Denies any chest pain Skin is warm and dry.

## 2015-10-14 ENCOUNTER — Ambulatory Visit: Payer: Medicaid Other | Admitting: Obstetrics

## 2015-10-14 ENCOUNTER — Encounter: Payer: Self-pay | Admitting: Obstetrics

## 2015-10-14 ENCOUNTER — Other Ambulatory Visit (HOSPITAL_COMMUNITY)
Admission: RE | Admit: 2015-10-14 | Discharge: 2015-10-14 | Disposition: A | Discharge status: Home / Self Care | Payer: Medicaid Other | Source: Ambulatory Visit | Attending: Obstetrics | Admitting: Obstetrics

## 2015-10-14 VITALS — BP 146/84 | HR 105 | Ht 67.5 in | Wt 251.0 lb

## 2015-10-14 DIAGNOSIS — B373 Candidiasis of vulva and vagina: Secondary | ICD-10-CM

## 2015-10-14 DIAGNOSIS — B3731 Acute candidiasis of vulva and vagina: Secondary | ICD-10-CM

## 2015-10-14 DIAGNOSIS — N390 Urinary tract infection, site not specified: Secondary | ICD-10-CM | POA: Diagnosis not present

## 2015-10-14 DIAGNOSIS — Z01419 Encounter for gynecological examination (general) (routine) without abnormal findings: Secondary | ICD-10-CM | POA: Insufficient documentation

## 2015-10-14 DIAGNOSIS — Z1151 Encounter for screening for human papillomavirus (HPV): Secondary | ICD-10-CM | POA: Diagnosis present

## 2015-10-14 DIAGNOSIS — A6009 Herpesviral infection of other urogenital tract: Secondary | ICD-10-CM

## 2015-10-14 DIAGNOSIS — Z113 Encounter for screening for infections with a predominantly sexual mode of transmission: Secondary | ICD-10-CM | POA: Diagnosis present

## 2015-10-14 DIAGNOSIS — A499 Bacterial infection, unspecified: Secondary | ICD-10-CM

## 2015-10-14 DIAGNOSIS — Z124 Encounter for screening for malignant neoplasm of cervix: Secondary | ICD-10-CM

## 2015-10-14 LAB — POCT URINALYSIS DIPSTICK
Bilirubin, UA: NEGATIVE
Blood, UA: NEGATIVE
GLUCOSE UA: 500
Ketones, UA: NEGATIVE
LEUKOCYTES UA: NEGATIVE
NITRITE UA: POSITIVE
PROTEIN UA: NEGATIVE
SPEC GRAV UA: 1.01
UROBILINOGEN UA: NEGATIVE
pH, UA: 5

## 2015-10-14 MED ORDER — CEFUROXIME AXETIL 500 MG PO TABS: 500.0000 mg | ORAL_TABLET | Freq: Two times a day (BID) | ORAL | 2 refills | Status: DC

## 2015-10-14 MED ORDER — ACYCLOVIR 400 MG PO TABS: 400.0000 mg | ORAL_TABLET | Freq: Three times a day (TID) | ORAL | 99 refills | Status: DC

## 2015-10-14 MED ORDER — FLUCONAZOLE 150 MG PO TABS: ORAL_TABLET | ORAL | 0 refills | Status: DC

## 2015-10-15 LAB — GC/CHLAMYDIA PROBE AMP (~~LOC~~) NOT AT ARMC
Chlamydia: NEGATIVE
Neisseria Gonorrhea: NEGATIVE

## 2015-10-16 LAB — CYTOLOGY - PAP

## 2015-10-17 ENCOUNTER — Other Ambulatory Visit: Payer: Self-pay | Admitting: Obstetrics

## 2015-10-17 LAB — URINE CULTURE

## 2015-10-17 LAB — SPECIMEN STATUS REPORT

## 2015-10-18 ENCOUNTER — Emergency Department (HOSPITAL_COMMUNITY): Payer: Medicaid Other

## 2015-10-18 ENCOUNTER — Inpatient Hospital Stay (HOSPITAL_COMMUNITY)
Admission: EM | Admit: 2015-10-18 | Discharge: 2015-10-21 | DRG: 191 | Disposition: A | Discharge status: Home / Self Care | Payer: Medicaid Other | Attending: Internal Medicine | Admitting: Internal Medicine

## 2015-10-18 ENCOUNTER — Encounter (HOSPITAL_COMMUNITY): Payer: Self-pay

## 2015-10-18 DIAGNOSIS — F17210 Nicotine dependence, cigarettes, uncomplicated: Secondary | ICD-10-CM | POA: Diagnosis present

## 2015-10-18 DIAGNOSIS — E1121 Type 2 diabetes mellitus with diabetic nephropathy: Secondary | ICD-10-CM | POA: Diagnosis not present

## 2015-10-18 DIAGNOSIS — Z23 Encounter for immunization: Secondary | ICD-10-CM

## 2015-10-18 DIAGNOSIS — Z9861 Coronary angioplasty status: Secondary | ICD-10-CM

## 2015-10-18 DIAGNOSIS — Z79899 Other long term (current) drug therapy: Secondary | ICD-10-CM

## 2015-10-18 DIAGNOSIS — M797 Fibromyalgia: Secondary | ICD-10-CM | POA: Diagnosis present

## 2015-10-18 DIAGNOSIS — Z880 Allergy status to penicillin: Secondary | ICD-10-CM

## 2015-10-18 DIAGNOSIS — R0902 Hypoxemia: Secondary | ICD-10-CM | POA: Diagnosis present

## 2015-10-18 DIAGNOSIS — E1142 Type 2 diabetes mellitus with diabetic polyneuropathy: Secondary | ICD-10-CM | POA: Diagnosis present

## 2015-10-18 DIAGNOSIS — F319 Bipolar disorder, unspecified: Secondary | ICD-10-CM | POA: Diagnosis present

## 2015-10-18 DIAGNOSIS — A630 Anogenital (venereal) warts: Secondary | ICD-10-CM | POA: Diagnosis present

## 2015-10-18 DIAGNOSIS — Z7984 Long term (current) use of oral hypoglycemic drugs: Secondary | ICD-10-CM

## 2015-10-18 DIAGNOSIS — I2510 Atherosclerotic heart disease of native coronary artery without angina pectoris: Secondary | ICD-10-CM | POA: Diagnosis present

## 2015-10-18 DIAGNOSIS — J441 Chronic obstructive pulmonary disease with (acute) exacerbation: Secondary | ICD-10-CM | POA: Diagnosis not present

## 2015-10-18 DIAGNOSIS — Z7902 Long term (current) use of antithrombotics/antiplatelets: Secondary | ICD-10-CM

## 2015-10-18 DIAGNOSIS — D72829 Elevated white blood cell count, unspecified: Secondary | ICD-10-CM | POA: Diagnosis not present

## 2015-10-18 DIAGNOSIS — Z79891 Long term (current) use of opiate analgesic: Secondary | ICD-10-CM

## 2015-10-18 DIAGNOSIS — K219 Gastro-esophageal reflux disease without esophagitis: Secondary | ICD-10-CM | POA: Diagnosis present

## 2015-10-18 DIAGNOSIS — E785 Hyperlipidemia, unspecified: Secondary | ICD-10-CM | POA: Diagnosis present

## 2015-10-18 DIAGNOSIS — Z8249 Family history of ischemic heart disease and other diseases of the circulatory system: Secondary | ICD-10-CM

## 2015-10-18 DIAGNOSIS — M549 Dorsalgia, unspecified: Secondary | ICD-10-CM | POA: Diagnosis present

## 2015-10-18 DIAGNOSIS — A499 Bacterial infection, unspecified: Secondary | ICD-10-CM

## 2015-10-18 DIAGNOSIS — G8929 Other chronic pain: Secondary | ICD-10-CM | POA: Diagnosis present

## 2015-10-18 DIAGNOSIS — N390 Urinary tract infection, site not specified: Secondary | ICD-10-CM

## 2015-10-18 DIAGNOSIS — T380X5A Adverse effect of glucocorticoids and synthetic analogues, initial encounter: Secondary | ICD-10-CM | POA: Diagnosis not present

## 2015-10-18 DIAGNOSIS — D573 Sickle-cell trait: Secondary | ICD-10-CM | POA: Diagnosis present

## 2015-10-18 DIAGNOSIS — Z794 Long term (current) use of insulin: Secondary | ICD-10-CM

## 2015-10-18 DIAGNOSIS — B9620 Unspecified Escherichia coli [E. coli] as the cause of diseases classified elsewhere: Secondary | ICD-10-CM | POA: Diagnosis present

## 2015-10-18 DIAGNOSIS — Z7982 Long term (current) use of aspirin: Secondary | ICD-10-CM

## 2015-10-18 DIAGNOSIS — Z955 Presence of coronary angioplasty implant and graft: Secondary | ICD-10-CM

## 2015-10-18 DIAGNOSIS — Z825 Family history of asthma and other chronic lower respiratory diseases: Secondary | ICD-10-CM

## 2015-10-18 DIAGNOSIS — Z6839 Body mass index (BMI) 39.0-39.9, adult: Secondary | ICD-10-CM

## 2015-10-18 DIAGNOSIS — I10 Essential (primary) hypertension: Secondary | ICD-10-CM | POA: Diagnosis present

## 2015-10-18 LAB — COMPREHENSIVE METABOLIC PANEL
ALK PHOS: 91 U/L (ref 38–126)
ALT: 15 U/L (ref 14–54)
ANION GAP: 8 (ref 5–15)
AST: 14 U/L — ABNORMAL LOW (ref 15–41)
Albumin: 4 g/dL (ref 3.5–5.0)
BILIRUBIN TOTAL: 0.6 mg/dL (ref 0.3–1.2)
BUN: 19 mg/dL (ref 6–20)
CALCIUM: 8.7 mg/dL — AB (ref 8.9–10.3)
CO2: 24 mmol/L (ref 22–32)
CREATININE: 0.77 mg/dL (ref 0.44–1.00)
Chloride: 105 mmol/L (ref 101–111)
Glucose, Bld: 179 mg/dL — ABNORMAL HIGH (ref 65–99)
Potassium: 3.7 mmol/L (ref 3.5–5.1)
SODIUM: 137 mmol/L (ref 135–145)
TOTAL PROTEIN: 7.4 g/dL (ref 6.5–8.1)

## 2015-10-18 LAB — CBC WITH DIFFERENTIAL/PLATELET
Basophils Absolute: 0 10*3/uL (ref 0.0–0.1)
Basophils Relative: 0 %
EOS ABS: 0.7 10*3/uL (ref 0.0–0.7)
Eosinophils Relative: 6 %
HEMATOCRIT: 38 % (ref 36.0–46.0)
HEMOGLOBIN: 13.4 g/dL (ref 12.0–15.0)
LYMPHS ABS: 3.8 10*3/uL (ref 0.7–4.0)
LYMPHS PCT: 31 %
MCH: 29.6 pg (ref 26.0–34.0)
MCHC: 35.3 g/dL (ref 30.0–36.0)
MCV: 83.9 fL (ref 78.0–100.0)
MONOS PCT: 5 %
Monocytes Absolute: 0.6 10*3/uL (ref 0.1–1.0)
NEUTROS PCT: 58 %
Neutro Abs: 7.1 10*3/uL (ref 1.7–7.7)
Platelets: 304 10*3/uL (ref 150–400)
RBC: 4.53 MIL/uL (ref 3.87–5.11)
RDW: 12.9 % (ref 11.5–15.5)
WBC: 12.2 10*3/uL — ABNORMAL HIGH (ref 4.0–10.5)

## 2015-10-18 LAB — GLUCOSE, CAPILLARY
GLUCOSE-CAPILLARY: 259 mg/dL — AB (ref 65–99)
GLUCOSE-CAPILLARY: 300 mg/dL — AB (ref 65–99)
Glucose-Capillary: 396 mg/dL — ABNORMAL HIGH (ref 65–99)

## 2015-10-18 LAB — TROPONIN I: Troponin I: <0.03 ng/mL (ref <0.03)

## 2015-10-18 MED ORDER — METHYLPREDNISOLONE SODIUM SUCC 125 MG IJ SOLR
125.0000 mg | Freq: Once | INTRAMUSCULAR | Status: AC
  Administered 2015-10-18: 125 mg via INTRAVENOUS
  Filled 2015-10-18: qty 2

## 2015-10-18 MED ORDER — ALBUTEROL SULFATE (2.5 MG/3ML) 0.083% IN NEBU
2.5000 mg | INHALATION_SOLUTION | RESPIRATORY_TRACT | Status: DC | PRN
  Administered 2015-10-19 – 2015-10-20 (×2): 2.5 mg via RESPIRATORY_TRACT
  Filled 2015-10-18 (×2): qty 3

## 2015-10-18 MED ORDER — PNEUMOCOCCAL VAC POLYVALENT 25 MCG/0.5ML IJ INJ
0.5000 mL | INJECTION | INTRAMUSCULAR | Status: AC
  Administered 2015-10-19: 0.5 mL via INTRAMUSCULAR
  Filled 2015-10-18 (×2): qty 0.5

## 2015-10-18 MED ORDER — GUAIFENESIN ER 600 MG PO TB12
1200.0000 mg | ORAL_TABLET | Freq: Two times a day (BID) | ORAL | Status: DC
  Administered 2015-10-18 – 2015-10-21 (×7): 1200 mg via ORAL
  Filled 2015-10-18 (×7): qty 2

## 2015-10-18 MED ORDER — OXYCODONE-ACETAMINOPHEN 5-325 MG PO TABS
1.0000 | ORAL_TABLET | Freq: Four times a day (QID) | ORAL | Status: DC
  Administered 2015-10-18 – 2015-10-21 (×13): 1 via ORAL
  Filled 2015-10-18 (×12): qty 1

## 2015-10-18 MED ORDER — IPRATROPIUM-ALBUTEROL 0.5-2.5 (3) MG/3ML IN SOLN
3.0000 mL | Freq: Once | RESPIRATORY_TRACT | Status: AC
  Administered 2015-10-18: 3 mL via RESPIRATORY_TRACT
  Filled 2015-10-18: qty 3

## 2015-10-18 MED ORDER — LEVOFLOXACIN IN D5W 750 MG/150ML IV SOLN
750.0000 mg | Freq: Once | INTRAVENOUS | Status: AC
  Administered 2015-10-18: 750 mg via INTRAVENOUS
  Filled 2015-10-18: qty 150

## 2015-10-18 MED ORDER — VITAMIN B-12 1000 MCG PO TABS
1000.0000 ug | ORAL_TABLET | Freq: Every day | ORAL | Status: DC
  Administered 2015-10-18 – 2015-10-21 (×4): 1000 ug via ORAL
  Filled 2015-10-18 (×4): qty 1

## 2015-10-18 MED ORDER — IPRATROPIUM-ALBUTEROL 0.5-2.5 (3) MG/3ML IN SOLN: 3.0000 mL | Freq: Once | RESPIRATORY_TRACT | Status: DC

## 2015-10-18 MED ORDER — CEFUROXIME AXETIL 500 MG PO TABS
500.0000 mg | ORAL_TABLET | Freq: Two times a day (BID) | ORAL | Status: DC
  Administered 2015-10-18 – 2015-10-21 (×6): 500 mg via ORAL
  Filled 2015-10-18 (×7): qty 1

## 2015-10-18 MED ORDER — ENOXAPARIN SODIUM 40 MG/0.4ML ~~LOC~~ SOLN: 40.0000 mg | SUBCUTANEOUS | Status: DC

## 2015-10-18 MED ORDER — SODIUM CHLORIDE 0.9 % IV SOLN
INTRAVENOUS | Status: DC
  Administered 2015-10-18 – 2015-10-19 (×2): via INTRAVENOUS

## 2015-10-18 MED ORDER — SIMVASTATIN 40 MG PO TABS
40.0000 mg | ORAL_TABLET | Freq: Every day | ORAL | Status: DC
  Administered 2015-10-18 – 2015-10-19 (×2): 40 mg via ORAL
  Filled 2015-10-18 (×2): qty 1

## 2015-10-18 MED ORDER — INSULIN DETEMIR 100 UNIT/ML ~~LOC~~ SOLN
20.0000 [IU] | Freq: Every day | SUBCUTANEOUS | Status: DC
  Administered 2015-10-18: 20 [IU] via SUBCUTANEOUS
  Filled 2015-10-18: qty 0.2

## 2015-10-18 MED ORDER — ASPIRIN EC 81 MG PO TBEC
81.0000 mg | DELAYED_RELEASE_TABLET | Freq: Every day | ORAL | Status: DC
  Administered 2015-10-18 – 2015-10-21 (×4): 81 mg via ORAL
  Filled 2015-10-18 (×4): qty 1

## 2015-10-18 MED ORDER — TRAZODONE HCL 50 MG PO TABS
50.0000 mg | ORAL_TABLET | Freq: Every evening | ORAL | Status: DC | PRN
  Administered 2015-10-18 – 2015-10-19 (×2): 50 mg via ORAL
  Filled 2015-10-18 (×3): qty 1

## 2015-10-18 MED ORDER — IPRATROPIUM BROMIDE 0.02 % IN SOLN: 0.5000 mg | Freq: Four times a day (QID) | RESPIRATORY_TRACT | Status: DC

## 2015-10-18 MED ORDER — GABAPENTIN 600 MG PO TABS: 600.0000 mg | ORAL_TABLET | Freq: Three times a day (TID) | ORAL | Status: DC

## 2015-10-18 MED ORDER — CARVEDILOL 6.25 MG PO TABS
6.2500 mg | ORAL_TABLET | Freq: Two times a day (BID) | ORAL | Status: DC
  Administered 2015-10-18 – 2015-10-21 (×7): 6.25 mg via ORAL
  Filled 2015-10-18 (×6): qty 1

## 2015-10-18 MED ORDER — SODIUM CHLORIDE 0.9 % IV BOLUS (SEPSIS)
1000.0000 mL | Freq: Once | INTRAVENOUS | Status: AC
  Administered 2015-10-18: 1000 mL via INTRAVENOUS

## 2015-10-18 MED ORDER — FLUTICASONE PROPIONATE 50 MCG/ACT NA SUSP
1.0000 | Freq: Every day | NASAL | Status: DC | PRN
  Administered 2015-10-18: 1 via NASAL
  Filled 2015-10-18: qty 16

## 2015-10-18 MED ORDER — ACETAMINOPHEN 325 MG PO TABS: 650.0000 mg | ORAL_TABLET | Freq: Four times a day (QID) | ORAL | Status: DC | PRN

## 2015-10-18 MED ORDER — ENOXAPARIN SODIUM 60 MG/0.6ML ~~LOC~~ SOLN
60.0000 mg | Freq: Every day | SUBCUTANEOUS | Status: DC
Start: 2015-10-18 — End: 2015-10-21
  Administered 2015-10-18 – 2015-10-20 (×3): 60 mg via SUBCUTANEOUS
  Filled 2015-10-18 (×3): qty 0.6

## 2015-10-18 MED ORDER — BUPROPION HCL ER (SR) 150 MG PO TB12
150.0000 mg | ORAL_TABLET | Freq: Two times a day (BID) | ORAL | Status: DC
  Administered 2015-10-18 – 2015-10-21 (×7): 150 mg via ORAL
  Filled 2015-10-18 (×7): qty 1

## 2015-10-18 MED ORDER — IPRATROPIUM BROMIDE 0.02 % IN SOLN
0.5000 mg | Freq: Once | RESPIRATORY_TRACT | Status: AC
  Administered 2015-10-18: 0.5 mg via RESPIRATORY_TRACT
  Filled 2015-10-18: qty 2.5

## 2015-10-18 MED ORDER — ONDANSETRON HCL 4 MG PO TABS
4.0000 mg | ORAL_TABLET | Freq: Four times a day (QID) | ORAL | Status: DC | PRN
  Administered 2015-10-20: 4 mg via ORAL
  Filled 2015-10-18: qty 1

## 2015-10-18 MED ORDER — ACYCLOVIR 400 MG PO TABS
400.0000 mg | ORAL_TABLET | Freq: Three times a day (TID) | ORAL | Status: DC
  Administered 2015-10-18 – 2015-10-21 (×10): 400 mg via ORAL
  Filled 2015-10-18 (×10): qty 1

## 2015-10-18 MED ORDER — ISOSORBIDE MONONITRATE ER 30 MG PO TB24: 15.0000 mg | ORAL_TABLET | Freq: Every day | ORAL | Status: DC

## 2015-10-18 MED ORDER — MORPHINE SULFATE (PF) 2 MG/ML IV SOLN
1.0000 mg | INTRAVENOUS | Status: DC | PRN
  Administered 2015-10-18 – 2015-10-21 (×10): 1 mg via INTRAVENOUS
  Filled 2015-10-18 (×12): qty 1

## 2015-10-18 MED ORDER — ORAL CARE MOUTH RINSE
15.0000 mL | Freq: Two times a day (BID) | OROMUCOSAL | Status: DC
  Administered 2015-10-18 – 2015-10-21 (×6): 15 mL via OROMUCOSAL

## 2015-10-18 MED ORDER — BACLOFEN 10 MG PO TABS
10.0000 mg | ORAL_TABLET | Freq: Three times a day (TID) | ORAL | Status: DC
  Administered 2015-10-18 – 2015-10-21 (×10): 10 mg via ORAL
  Filled 2015-10-18 (×10): qty 1

## 2015-10-18 MED ORDER — INSULIN ASPART 100 UNIT/ML ~~LOC~~ SOLN: 0.0000 [IU] | Freq: Three times a day (TID) | SUBCUTANEOUS | Status: DC

## 2015-10-18 MED ORDER — INSULIN ASPART 100 UNIT/ML ~~LOC~~ SOLN
0.0000 [IU] | Freq: Three times a day (TID) | SUBCUTANEOUS | Status: DC
  Administered 2015-10-18: 5 [IU] via SUBCUTANEOUS

## 2015-10-18 MED ORDER — LOSARTAN POTASSIUM 50 MG PO TABS
50.0000 mg | ORAL_TABLET | Freq: Every day | ORAL | Status: DC
  Administered 2015-10-18 – 2015-10-21 (×4): 50 mg via ORAL
  Filled 2015-10-18 (×4): qty 1

## 2015-10-18 MED ORDER — ALBUTEROL SULFATE (2.5 MG/3ML) 0.083% IN NEBU: 2.5000 mg | INHALATION_SOLUTION | Freq: Four times a day (QID) | RESPIRATORY_TRACT | Status: DC

## 2015-10-18 MED ORDER — IPRATROPIUM-ALBUTEROL 0.5-2.5 (3) MG/3ML IN SOLN
3.0000 mL | Freq: Three times a day (TID) | RESPIRATORY_TRACT | Status: DC
  Administered 2015-10-19 – 2015-10-20 (×4): 3 mL via RESPIRATORY_TRACT
  Filled 2015-10-18 (×5): qty 3

## 2015-10-18 MED ORDER — INSULIN ASPART 100 UNIT/ML ~~LOC~~ SOLN
0.0000 [IU] | Freq: Three times a day (TID) | SUBCUTANEOUS | Status: DC
  Administered 2015-10-18: 20 [IU] via SUBCUTANEOUS
  Administered 2015-10-19 – 2015-10-20 (×3): 11 [IU] via SUBCUTANEOUS
  Administered 2015-10-20: 15 [IU] via SUBCUTANEOUS
  Administered 2015-10-20 – 2015-10-21 (×2): 20 [IU] via SUBCUTANEOUS
  Administered 2015-10-21: 7 [IU] via SUBCUTANEOUS

## 2015-10-18 MED ORDER — PANTOPRAZOLE SODIUM 40 MG PO TBEC
40.0000 mg | DELAYED_RELEASE_TABLET | Freq: Every day | ORAL | Status: DC
  Administered 2015-10-18 – 2015-10-21 (×4): 40 mg via ORAL
  Filled 2015-10-18 (×4): qty 1

## 2015-10-18 MED ORDER — ALBUTEROL (5 MG/ML) CONTINUOUS INHALATION SOLN
10.0000 mg/h | INHALATION_SOLUTION | Freq: Once | RESPIRATORY_TRACT | Status: AC
  Administered 2015-10-18: 10 mg/h via RESPIRATORY_TRACT
  Filled 2015-10-18: qty 20

## 2015-10-18 MED ORDER — METHYLPREDNISOLONE SODIUM SUCC 125 MG IJ SOLR
60.0000 mg | Freq: Four times a day (QID) | INTRAMUSCULAR | Status: DC
  Administered 2015-10-18 – 2015-10-19 (×4): 60 mg via INTRAVENOUS
  Filled 2015-10-18 (×4): qty 2

## 2015-10-18 MED ORDER — ONDANSETRON HCL 4 MG/2ML IJ SOLN: 4.0000 mg | Freq: Four times a day (QID) | INTRAMUSCULAR | Status: DC | PRN

## 2015-10-18 MED ORDER — FENTANYL CITRATE (PF) 100 MCG/2ML IJ SOLN
100.0000 ug | Freq: Once | INTRAMUSCULAR | Status: AC
  Administered 2015-10-18: 100 ug via INTRAVENOUS
  Filled 2015-10-18: qty 2

## 2015-10-18 MED ORDER — ACETAMINOPHEN 650 MG RE SUPP: 650.0000 mg | Freq: Four times a day (QID) | RECTAL | Status: DC | PRN

## 2015-10-18 MED ORDER — MAGNESIUM SULFATE 2 GM/50ML IV SOLN
2.0000 g | Freq: Once | INTRAVENOUS | Status: AC
  Administered 2015-10-18: 2 g via INTRAVENOUS
  Filled 2015-10-18: qty 50

## 2015-10-18 MED ORDER — IPRATROPIUM-ALBUTEROL 0.5-2.5 (3) MG/3ML IN SOLN
3.0000 mL | Freq: Four times a day (QID) | RESPIRATORY_TRACT | Status: DC
  Administered 2015-10-18 (×2): 3 mL via RESPIRATORY_TRACT
  Filled 2015-10-18 (×2): qty 3

## 2015-10-18 MED ORDER — CLOPIDOGREL BISULFATE 75 MG PO TABS
75.0000 mg | ORAL_TABLET | Freq: Every day | ORAL | Status: DC
  Administered 2015-10-18 – 2015-10-21 (×4): 75 mg via ORAL
  Filled 2015-10-18 (×4): qty 1

## 2015-10-18 MED ORDER — ALBUTEROL SULFATE (2.5 MG/3ML) 0.083% IN NEBU: 5.0000 mg | INHALATION_SOLUTION | Freq: Once | RESPIRATORY_TRACT | Status: DC

## 2015-10-18 MED ORDER — LUBIPROSTONE 24 MCG PO CAPS
24.0000 ug | ORAL_CAPSULE | Freq: Two times a day (BID) | ORAL | Status: DC
  Administered 2015-10-18 – 2015-10-21 (×6): 24 ug via ORAL
  Filled 2015-10-18 (×7): qty 1

## 2015-10-18 MED ORDER — GABAPENTIN 300 MG PO CAPS
300.0000 mg | ORAL_CAPSULE | Freq: Three times a day (TID) | ORAL | Status: DC
  Administered 2015-10-18 – 2015-10-21 (×9): 300 mg via ORAL
  Filled 2015-10-18 (×10): qty 1

## 2015-10-18 NOTE — ED Notes (Signed)
Pt taken off nasal cannula to evaluate RA saturation prior to ambulation.

## 2015-10-18 NOTE — ED Notes (Signed)
BOTH BLOOD CULTURES DRAWN L HAND and R AC.

## 2015-10-18 NOTE — Progress Notes (Signed)
Rx Brief Lovenox note  Wt=113 kg, BMI=39 and CrCl~108  Rx adjusted Lovenox to 60 mg daily (~0.5 mg/kg) in pt with BMI>30  Thank Dorrene German 10/18/2015 1:20 PM

## 2015-10-18 NOTE — ED Notes (Signed)
With resting in bed with head of bed elevated pt 86-87% on RA. 2 lpm [place] placed and [facility] notified.

## 2015-10-18 NOTE — ED Notes (Signed)
[facility] verbal to give additional DUO nebulizer and ambulate 30 minutes post treatment. DO NOT AMBULATE if pt below 88% on RA.

## 2015-10-18 NOTE — Plan of Care (Signed)
Problem: Consults Goal: Diabetes Guidelines if Diabetic/Glucose > 140 If diabetic or lab glucose is > 140 mg/dl - Initiate Diabetes/Hyperglycemia Guidelines & Document Interventions   Outcome: Progressing p

## 2015-10-18 NOTE — ED Notes (Signed)
[facility] at bedside.

## 2015-10-18 NOTE — ED Provider Notes (Signed)
[place] DEPT Provider Note   CSN: [id number] Arrival date & time: 10/18/15  3267     History   Chief Complaint Chief Complaint  Patient presents with  . Shortness of Breath    HPI Cassidy [name] is a 50 y.o. female.   Shortness of Breath  This is a new problem. The average episode lasts 3 days. The problem occurs rarely.The current episode started more than 2 days ago. The problem has been gradually worsening. Associated symptoms include a fever and cough. Pertinent negatives include no neck pain, no chest pain, no vomiting, no abdominal pain and no leg swelling. She has tried beta-agonist inhalers for the symptoms. The treatment provided mild relief. She has had no prior hospitalizations. She has had prior ED visits. She has had no prior ICU admissions. Associated medical issues include COPD.    Past Medical History:  Diagnosis Date  . Anemia   . Atherosclerotic heart disease of native coronary artery with angina pectoris ([place]) 05/2012, 10/2013   a.  s/p PCTA to dRCA and ostial RPAV-PLA vessel + PDA branch (05/2012)  b. [place] s/p DES to mLAD - Resolute DES 3.0 x 22 (3.6m -->3.3 mm)  . Bipolar depression ([place]   . Breast abscess    a. right side.   .Marland KitchenCOPD (chronic obstructive pulmonary disease) ([place]   . Depression   . Diabetic peripheral neuropathy ([place]   . Fibromyalgia   . Genital warts   . GERD (gastroesophageal reflux disease)   . HLD (hyperlipidemia)   . Hypertension   . Pain in limb    a. LE VENOUS DUPLEX, 02/05/2009 - no evidence of deep vein thrombosis, Baker's cyst  . PTSD (post-traumatic stress disorder)   . Sickle cell trait ([place]   . Tobacco abuse   . Tooth caries   . Type II diabetes mellitus ([facility]     Patient Active Problem List   Diagnosis Date Noted  . Hyperlipidemia with target LDL less than 70   . Type II diabetes mellitus with nephropathy ([place]   . Fibromyalgia   . Diabetic peripheral neuropathy (HC[facility]   . Bipolar depression ([place]   . PTSD  (post-traumatic stress disorder)   . CAD S/P percutaneous coronary angioplasty 11/02/2013  . Leiomyoma of uterus, unspecified 07/20/2012  . BV (bacterial vaginosis) 07/20/2012  . Breast pain, right 07/17/2012  . Essential hypertension 05/26/2012  . Tobacco abuse 05/26/2012  . Unstable angina pectoris ([place] 05/26/2012  . Family history of early CAD 05/26/2012  . Perirectal abscess 03/14/2012  . Abscess of right breast 12/18/2010  . CARPAL TUNNEL SYNDROME, LEFT 12/18/2009  . BREAST MASS, RIGHT 08/13/2008  . INGROWN HAIR 05/03/2008  . TINEA VERSICOLOR 04/06/2007  . ABDOMINAL/PELVIC SWELLING MASS/LUMP EPIGASTRIC 04/06/2007  . BIPOLAR I D/O MOST RECENT EPIS DEPRESSED MOD 11/24/2006  . HERPES, GENITAL NEC 09/30/2006  . SUBSTANCE ABUSE 09/30/2006  . GERD 09/30/2006  . PLANTAR FASCIITIS 09/30/2006    Past Surgical History:  Procedure Laterality Date  . BLADDER SURGERY  1980   "TVT"  . BREAST SURGERY Right    I&D for multiple abscesses  . cutting balloon    . EYE SURGERY Left    laser surgery  . INCISE AND DRAIN ABCESS  ; 04/26/11; 08/13/11   right breast  . INCISION AND DRAINAGE ABSCESS Right 05/09/2012   Procedure: INCISION AND DRAINAGE RIGHT BREAST ABSCESS;  Surgeon: MImogene Burn TGeorgette Dover MD;  [place]  Service: General;  Laterality: Right;  . INCISION AND  DRAINAGE ABSCESS Right 02/08/2014   Procedure: INCISION AND DRAINAGE RIGHT BREAST ABSCESS;  Surgeon: Donnie Mesa, MD;  Location: [place];  Service: General;  Laterality: Right;  . INCISION AND DRAINAGE ABSCESS Right 06/14/2014   Procedure: INCISION AND DRAINAGE RIGHT BREAST ABSCESS;  Surgeon: Donnie Mesa, MD;  Location: [place];  Service: General;  Laterality: Right;  . IRRIGATION AND DEBRIDEMENT ABSCESS  12/19/2010   Procedure: IRRIGATION AND DEBRIDEMENT ABSCESS;  Surgeon: Rolm Bookbinder, MD;  Location: [place];  Service: General;  Laterality: Right;  . IRRIGATION AND DEBRIDEMENT ABSCESS  08/13/2011   Procedure: MINOR INCISION AND  DRAINAGE OF ABSCESS;  Surgeon: Gwenyth Ober, MD;  Location: [place];  Service: General;  Laterality: Right;  Right Breast   . IRRIGATION AND DEBRIDEMENT ABSCESS  11/17/2011   Procedure: IRRIGATION AND DEBRIDEMENT ABSCESS;  Surgeon: Imogene Burn. Georgette Dover, MD;  Location: [place] [facility];  Service: General;  Laterality: Right;  irrigation and debridement right recurrent breast abscess  . LARYNX SURGERY    . LEFT HEART CATHETERIZATION WITH CORONARY ANGIOGRAM N/A 05/29/2012   Procedure: LEFT HEART CATHETERIZATION WITH CORONARY ANGIOGRAM & PTCA;  Surgeon: Troy Sine, MD;  Location[facility] C[facility] LAB;  Service: Cardiovascular; Bifurcation dRCA-RPAD/PLA & rPDA  PTCA.  Marland Kitchen LEFT HEART CATHETERIZATION WITH CORONARY ANGIOGRAM N/A 11/08/2013   Procedure: LEFT HEART CATHETERIZATION WITH CORONARY ANGIOGRAM and Coronary Stent Intervention;  Surgeon: Leonie Man, MD;  Location: [facility] CATH LAB;  Service: Cardiovascular; mLAD 80% (FFR 0.73) - resolute DES 3.0 x 22 mm (postdilated to 3.3 mm->3.5 mm)  . NM MYOVIEW LTD  01/26/2009   Normal study, no evidence of ischemia, EF 67%  . ployp removed from voice box 03/30/12    . REFRACTIVE SURGERY  ~ 2010   right  . TONSILLECTOMY  1988    OB History    Gravida Para Term Preterm AB Living   '1 1 1     1   '$ SAB TAB Ectopic Multiple Live Births                   Home Medications    Prior to Admission medications   Medication Sig Start Date End Date Taking? Authorizing Provider  ACCU-CHEK AVIVA PLUS test strip TEST BLOOD SUGAR THREE TIMES DAILY AS DIRECTED 01/17/15   Philemon Kingdom, MD  acyclovir (ZOVIRAX) 400 MG tablet Take 1 tablet (400 mg total) by mouth 3 (three) times daily. 10/14/15   Shelly Bombard, MD  Albiglutide (TANZEUM) 50 MG PEN Inject 50 mg weekly under skin Patient taking differently: Inject 50 mg into the skin every Sunday.  08/12/15   Philemon Kingdom, MD  albuterol (PROVENTIL HFA;VENTOLIN HFA) 108 (90 Base) MCG/ACT inhaler Inhale 1-2 puffs into the lungs every 6 (six)  hours as needed for wheezing or shortness of breath. 03/27/15   Frederica Kuster, PA-C  albuterol (PROVENTIL) (2.5 MG/3ML) 0.083% nebulizer solution Take 2.5 mg by nebulization every 6 (six) hours as needed for wheezing or shortness of breath.    Historical Provider, MD  aspirin EC 81 MG tablet Take 81 mg by mouth daily.    Historical Provider, MD  azithromycin (ZITHROMAX Z-PAK) 250 MG tablet Take 1 tablet (250 mg total) by mouth daily. Take 2 tablets on day 1, then 1 each day for 4 days. Patient not taking: Reported on 10/14/2015 10/13/15 10/18/15  Elveria Rising, MD  baclofen (LIORESAL) 10 MG tablet Take 10 mg by mouth 3 (three) times daily. 10/08/15   Historical  Provider, MD  buPROPion (WELLBUTRIN SR) 150 MG 12 hr tablet Take 150 mg by mouth 2 (two) times daily. 09/25/15   Historical Provider, MD  carvedilol (COREG) 6.25 MG tablet Take 1 tablet (6.25 mg total) by mouth 2 (two) times daily with a meal. 04/15/15   Leonie Man, MD  cefUROXime (CEFTIN) 500 MG tablet Take 1 tablet (500 mg total) by mouth 2 (two) times daily with a meal. 10/14/15   Shelly Bombard, MD  clindamycin (CLEOCIN T) 1 % lotion Apply 1 application topically 2 (two) times daily. Apply to eczema on shoulders 08/12/15   Historical Provider, MD  clopidogrel (PLAVIX) 75 MG tablet Take 1 tablet (75 mg total) by mouth daily. 04/15/15   Leonie Man, MD  diclofenac sodium (VOLTAREN) 1 % GEL Apply 1 application topically 3 (three) times daily as needed (pain).    Historical Provider, MD  fluconazole (DIFLUCAN) 150 MG tablet TAKE 1 TABLET BY MOUTH ONCE 10/14/15   Shelly Bombard, MD  fluticasone [facility]) 50 MCG/ACT nasal spray Place 1 spray into both nostrils daily as needed (congestion).  12/11/13   Historical Provider, MD  furosemide (LASIX) 20 MG tablet Take 20 mg by mouth daily. 04/10/15   Historical Provider, MD  gabapentin (NEURONTIN) 600 MG tablet TAKE 1 TABLET BY MOUTH THREE TIMES DAILY Patient taking differently: TAKE 1 TABLET BY MOUTH  TWICE DAILY 08/19/15   Philemon Kingdom, MD  guaiFENesin 200 MG tablet Take 1 tablet (200 mg total) by mouth every 4 (four) hours as needed for cough or to loosen phlegm. 03/27/15   Frederica Kuster, PA-C  ibuprofen (ADVIL,MOTRIN) 200 MG tablet Take 400 mg by mouth every 6 (six) hours as needed (pain/ inflammation).    Historical Provider, MD  Insulin Detemir (LEVEMIR FLEXTOUCH) 100 UNIT/ML Pen Inject 20 Units into the skin daily at 10 pm. 03/20/15   Philemon Kingdom, MD  Insulin Pen Needle 32G X 4 MM MISC Use to inject insulin 4 times daily. 03/20/15   Philemon Kingdom, MD  Insulin Syringe-Needle U-100 (INSULIN SYRINGE 1CC/31GX5/16") 31G X 5/16" 1 ML MISC USE AS DIRECTED FOUR TIMES DAILY FOR INSULIN INJECTIONS. 04/28/15   Philemon Kingdom, MD  isosorbide mononitrate (IMDUR) 30 MG 24 hr tablet Take 0.5 tablets (15 mg total) by mouth daily. 04/15/15   Leonie Man, MD  losartan (COZAAR) 50 MG tablet Take 50 mg by mouth daily. 09/09/15   Historical Provider, MD  lubiprostone (AMITIZA) 24 MCG capsule Take 24 mcg by mouth 2 (two) times daily with a meal.    Historical Provider, MD  metFORMIN (GLUCOPHAGE-XR) 500 MG 24 hr tablet Take 2 tablets (1,000 mg total) by mouth daily with supper. 08/12/15   Philemon Kingdom, MD  mometasone (ELOCON) 0.1 % cream Apply 1 application topically 2 (two) times daily as needed (wound care (vaginal boils)).  09/09/15   Historical Provider, MD  nitroGLYCERIN (NITROSTAT) 0.4 MG SL tablet Place 1 tablet (0.4 mg total) under the tongue every 5 (five) minutes as needed for chest pain. 04/15/15   Leonie Man, MD  NOVOLOG FLEXPEN 100 UNIT/ML FlexPen INJECT 10-12 UNITS INTO THE SKIN 3 TIMES DAILY WITH MEALS. PLUS SLIDING SCALE. Patient taking differently: INJECT 12 -15 SUBCUTANEOUSLY BEFORE BREAKFAST AND LUNCH BASED ON CBG 07/09/15   Philemon Kingdom, MD  oxyCODONE-acetaminophen (PERCOCET/ROXICET) 5-325 MG tablet Take 1 tablet by mouth 4 (four) times daily.    Historical Provider, MD    pantoprazole (PROTONIX) 40 MG tablet Take 40 mg  by mouth daily. 09/05/15   Historical Provider, MD  phenazopyridine (PYRIDIUM) 100 MG tablet Take 1 tablet (100 mg total) by mouth 3 (three) times daily with meals. 05/22/15   Merryl Hacker, MD  Phenazopyridine HCl (AZO-STANDARD PO) Take 1 tablet by mouth daily as needed (urinary tract pain).    Historical Provider, MD  simvastatin (ZOCOR) 40 MG tablet TAKE 1 TABLET BY MOUTH ONCE A DAY AT 6:00 PM 06/11/15   Leonie Man, MD  traZODone (DESYREL) 100 MG tablet Take 50 mg by mouth at bedtime as needed for sleep.     Historical Provider, MD  vitamin B-12 (CYANOCOBALAMIN) 1000 MCG tablet Take 1,000 mcg by mouth daily.    Historical Provider, MD    Family History Family History  Problem Relation Age of Onset  . COPD Mother   . Cancer Mother     lymphoma  . Heart disease Mother   . Hyperlipidemia Father   . Heart disease Father   . Hypertension Maternal Grandmother   . Diabetes Maternal Grandmother   . Cancer Maternal Aunt     breast, colon  . Hypertension Brother     Social History Social History  Substance Use Topics  . Smoking status: Current Every Day Smoker    Packs/day: 0.25    Years: 30.00    Types: Cigarettes  . Smokeless tobacco: Never Used     Comment: 08/14/11 "quit for 3 wk 05/2011; was smoking 1 ppd since age 59 before I quit; at least I've cut down to 1/4 ppd"  . 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 Negative for activity change, appetite change and chills.  HENT: Negative for congestion and hearing loss.   Respiratory: Positive for cough, chest tightness and shortness of breath.   Cardiovascular: Negative for chest pain and leg swelling.  Gastrointestinal: Negative for abdominal pain, diarrhea, nausea and vomiting.  Endocrine: Negative for  polydipsia and polyuria.  Musculoskeletal: Negative for back pain and neck pain.  All other systems reviewed and are negative.    Physical Exam Updated Vital Signs BP 145/93 (BP Location: Left Arm)   Pulse 108   Temp 97.6 F (36.4 C) (Axillary)   Resp 24   Ht '5\' 7"'$  (1.702 m)   Wt 251 lb (113.9 kg)   SpO2 97%   BMI 39.31 kg/m   Physical Exam  Constitutional: She is oriented to person, place, and time. She appears well-developed and well-nourished.  HENT:  Head: Normocephalic and atraumatic.  Eyes: Conjunctivae and EOM are normal.  Neck: Normal range of motion.  Cardiovascular: Normal rate and regular rhythm.   Pulmonary/Chest: No accessory muscle usage or stridor. Tachypnea noted. No respiratory distress. She has decreased breath sounds. She has wheezes.  Abdominal: She exhibits no distension.  Musculoskeletal: Normal range of motion. She exhibits no edema or deformity.  Neurological: She is alert and oriented to person, place, and time.  Skin: Skin is warm and dry.  Nursing note and vitals reviewed.    ED Treatments / Results  Labs (all labs ordered are listed, but only abnormal results are displayed) Labs Reviewed  CBC WITH DIFFERENTIAL/PLATELET - Abnormal; Notable for the following:       Result Value   WBC 12.2 (*)    All other components within normal limits  COMPREHENSIVE METABOLIC PANEL -  Abnormal; Notable for the following:    Glucose, Bld 179 (*)    Calcium 8.7 (*)    AST 14 (*)    All other components within normal limits  GLUCOSE, CAPILLARY - Abnormal; Notable for the following:    Glucose-Capillary 259 (*)    All other components within normal limits  GLUCOSE, CAPILLARY - Abnormal; Notable for the following:    Glucose-Capillary 396 (*)    All other components within normal limits  CBC - Abnormal; Notable for the following:    WBC 11.9 (*)    All other components within normal limits  COMPREHENSIVE METABOLIC PANEL - Abnormal; Notable for the  following:    Sodium 134 (*)    Glucose, Bld 371 (*)    AST 12 (*)    All other components within normal limits  GLUCOSE, CAPILLARY - Abnormal; Notable for the following:    Glucose-Capillary 300 (*)    All other components within normal limits  TROPONIN I  HEMOGLOBIN A1C    EKG  EKG Interpretation  Date/Time:  Saturday October 18 2015 07:42:37 EDT Ventricular Rate:  88 PR Interval:    QRS Duration: 89 QT Interval:  372 QTC Calculation: 451 R Axis:   56 Text Interpretation:  Sinus rhythm No significant change since last tracing Confirmed by [facility] MD, Corene Cornea [id number]) on 10/18/2015 9:17:39 AM       Radiology No results found.  Procedures Procedures (including critical care time)  CRITICAL CARE Performed by: Merrily Pew Total critical care time: 35 minutes Critical care time was exclusive of separately billable procedures and treating other patients. Critical care was necessary to treat or prevent imminent or life-threatening deterioration. Critical care was time spent personally by me on the following activities: development of treatment plan with patient and/or surrogate as well as nursing, discussions with consultants, evaluation of patient's response to treatment, examination of patient, obtaining history from patient or surrogate, ordering and performing treatments and interventions, ordering and review of laboratory studies, ordering and review of radiographic studies, pulse oximetry and re-evaluation of patient's condition.   Medications Ordered in ED Medications  albuterol (PROVENTIL) (2.5 MG/3ML) 0.083% nebulizer solution 5 mg (5 mg Nebulization Not Given 10/18/15 0808)  acyclovir (ZOVIRAX) tablet 400 mg (400 mg Oral Given 10/18/15 2233)  baclofen (LIORESAL) tablet 10 mg (10 mg Oral Given 10/18/15 2209)  buPROPion (WELLBUTRIN SR) 12 hr tablet 150 mg (150 mg Oral Given 10/18/15 2209)  losartan (COZAAR) tablet 50 mg (50 mg Oral Given 10/18/15 1411)  lubiprostone  (AMITIZA) capsule 24 mcg (24 mcg Oral Given 10/18/15 1707)  oxyCODONE-acetaminophen (PERCOCET/ROXICET) 5-325 MG per tablet 1 tablet (1 tablet Oral Given 10/18/15 2209)  pantoprazole (PROTONIX) EC tablet 40 mg (40 mg Oral Given 10/18/15 1412)  vitamin B-12 (CYANOCOBALAMIN) tablet 1,000 mcg (1,000 mcg Oral Given 10/18/15 1411)  simvastatin (ZOCOR) tablet 40 mg (40 mg Oral Given 10/18/15 1757)  carvedilol (COREG) tablet 6.25 mg (6.25 mg Oral Given 10/18/15 1707)  clopidogrel (PLAVIX) tablet 75 mg (75 mg Oral Given 10/18/15 1411)  insulin detemir (LEVEMIR) injection 20 Units (20 Units Subcutaneous Given 10/18/15 2209)  fluticasone (FLONASE) 50 MCG/ACT nasal spray 1 spray (1 spray Each Nare Given 10/18/15 1409)  aspirin EC tablet 81 mg (81 mg Oral Given 10/18/15 1411)  traZODone (DESYREL) tablet 50 mg (50 mg Oral Given 10/18/15 2209)  0.9 %  sodium chloride infusion ( Intravenous New Bag/Given 10/18/15 1412)  acetaminophen (TYLENOL) tablet 650 mg (not administered)    Or  acetaminophen (TYLENOL) suppository 650 mg (not administered)  ondansetron (ZOFRAN) tablet 4 mg (not administered)    Or  ondansetron (ZOFRAN) injection 4 mg (not administered)  methylPREDNISolone sodium succinate (SOLU-MEDROL) 125 mg/2 mL injection 60 mg (60 mg Intravenous Given 10/19/15 0136)  guaiFENesin (MUCINEX) 12 hr tablet 1,200 mg (1,200 mg Oral Given 10/18/15 2209)  cefUROXime (CEFTIN) tablet 500 mg (500 mg Oral Given 10/18/15 1707)  gabapentin (NEURONTIN) capsule 300 mg (300 mg Oral Given 10/18/15 2218)  pneumococcal 23 valent vaccine (PNU-IMMUNE) injection 0.5 mL (not administered)  enoxaparin (LOVENOX) injection 60 mg (60 mg Subcutaneous Given 10/18/15 1410)  MEDLINE mouth rinse (15 mLs Mouth Rinse Given 10/18/15 2215)  insulin aspart (novoLOG) injection 0-20 Units (20 Units Subcutaneous Given 10/18/15 1720)  ipratropium-albuterol (DUONEB) 0.5-2.5 (3) MG/3ML nebulizer solution 3 mL (not administered)  albuterol (PROVENTIL) (2.5  MG/3ML) 0.083% nebulizer solution 2.5 mg (2.5 mg Nebulization Given 10/19/15 0528)  morphine 2 MG/ML injection 1 mg (1 mg Intravenous Given 10/19/15 0513)  albuterol (PROVENTIL,VENTOLIN) solution continuous neb (10 mg/hr Nebulization Given 10/18/15 0815)  ipratropium (ATROVENT) nebulizer solution 0.5 mg (0.5 mg Nebulization Given 10/18/15 0815)  methylPREDNISolone sodium succinate (SOLU-MEDROL) 125 mg/2 mL injection 125 mg (125 mg Intravenous Given 10/18/15 0817)  magnesium sulfate IVPB 2 g 50 mL (0 g Intravenous Stopped 10/18/15 0912)  fentaNYL (SUBLIMAZE) injection 100 mcg (100 mcg Intravenous Given 10/18/15 0817)  sodium chloride 0.9 % bolus 1,000 mL (0 mLs Intravenous Stopped 10/18/15 0912)  levofloxacin (LEVAQUIN) IVPB 750 mg (0 mg Intravenous Stopped 10/18/15 1000)  ipratropium-albuterol (DUONEB) 0.5-2.5 (3) MG/3ML nebulizer solution 3 mL (3 mLs Nebulization Given 10/18/15 1025)     Initial Impression / Assessment and Plan / ED Course  I have reviewed the triage vital signs and the nursing notes.  Pertinent labs & imaging results that were available during my care of the patient were reviewed by me and considered in my medical decision making (see chart for details).  Clinical Course   Likely continued copd exacerbation, however acutely worsened so will ensure no cardiac cause and repeat XR to ensure no PNA.  otherwise steroids, beta agonists, magnesium, levaquin.   On multiple repeat examinations patient with persistent tachypnea and hypoxia. As low as 86% on room air at rest. I feel the patient likely will require continued albuterol and steroid therapy and with her hypoxia should be admitted to the hospital so discussed with medicine who will admit.  Final Clinical Impressions(s) / ED Diagnoses   Final diagnoses:  COPD exacerbation ([address])  Hypoxia    New Prescriptions New Prescriptions   No medications on file     Merrily Pew, MD 10/19/15 [phone]

## 2015-10-18 NOTE — ED Notes (Signed)
Pt transported to DG.  

## 2015-10-18 NOTE — H&P (Signed)
TRH H&P    Patient Demographics:    Cassidy [name], is a 50 y.o. female  MRN: [id number]  DOB - Nov 11, 1965  Admit Date - 10/18/2015  Referring MD/NP/PA: Dr Dayna Barker  Outpatient Primary MD for the patient is Javier Docker, MD  Patient coming from: Home  Chief Complaint  Patient presents with  . Shortness of Breath      HPI:    Cassidy [name]  is a 51 y.o. female, With history of diabetes mellitus, CAD status post DES to LAD, depression, tobacco abuse, recent diagnosis of COPD came to hospital with worsening shortness of breath following up with his doctor symptoms of stuffy nose. Patient was seen in the ED 2 days ago and was prescribed nebulizer treatment, consider that she did not improve and that and infected "worse. She also has been coughing up white to yellow colored phlegm but bloody streaks, denies chest pain. No nausea vomiting or diarrhea.  In the ED patient became hypoxic with O2 sats 86-87% on room air and requiring oxygen via nasal cannula. Chest x-ray shows no pneumonia   Review of systems:    In addition to the HPI above,  No Fever-chills, No Headache, No changes with Vision or hearing, No problems swallowing food or Liquids, No Abdominal pain, No Nausea or Vomiting, bowel movements are regular, No Blood in stool or Urine, No dysuria, No new skin rashes or bruises, No new joints pains-aches,  No new weakness, tingling, numbness in any extremity, No recent weight gain or loss, No polyuria, polydypsia or polyphagia, No significant Mental Stressors.  A full 10 point Review of Systems was done, except as stated above, all other Review of Systems were negative.   With Past History of the following :    Past Medical History:  Diagnosis Date  . Anemia   . Atherosclerotic heart disease of native coronary artery with angina pectoris ([place]) 05/2012, 10/2013   a.  s/p PCTA to dRCA and  ostial RPAV-PLA vessel + PDA branch (05/2012)  b. [place] s/p DES to mLAD - Resolute DES 3.0 x 22 (3.50m -->3.3 mm)  . Bipolar depression ([place]   . Breast abscess    a. right side.   .Marland KitchenCOPD (chronic obstructive pulmonary disease) ([place]   . Depression   . Diabetic peripheral neuropathy ([place]   . Fibromyalgia   . Genital warts   . GERD (gastroesophageal reflux disease)   . HLD (hyperlipidemia)   . Hypertension   . Pain in limb    a. LE VENOUS DUPLEX, 02/05/2009 - no evidence of deep vein thrombosis, Baker's cyst  . PTSD (post-traumatic stress disorder)   . Sickle cell trait ([facility]   . Tobacco abuse   . Tooth caries   . Type II diabetes mellitus ([place]       Past Surgical History:  Procedure Laterality Date  . BLADDER SURGERY  1980   "TVT"  . BREAST SURGERY Right    I&D for multiple abscesses  . cutting balloon    . EYE SURGERY Left  laser surgery  . INCISE AND DRAIN ABCESS  ; 04/26/11; 08/13/11   right breast  . INCISION AND DRAINAGE ABSCESS Right 05/09/2012   Procedure: INCISION AND DRAINAGE RIGHT BREAST ABSCESS;  Surgeon: Imogene Burn. Georgette Dover, MD;  Location: [facility];  Service: General;  Laterality: Right;  . INCISION AND DRAINAGE ABSCESS Right 02/08/2014   Procedure: INCISION AND DRAINAGE RIGHT BREAST ABSCESS;  Surgeon: Donnie Mesa, MD;  Location: [facility];  Service: General;  Laterality: Right;  . INCISION AND DRAINAGE ABSCESS Right 06/14/2014   Procedure: INCISION AND DRAINAGE RIGHT BREAST ABSCESS;  Surgeon: Donnie Mesa, MD;  Location: [facility];  Service: General;  Laterality: Right;  . IRRIGATION AND DEBRIDEMENT ABSCESS  12/19/2010   Procedure: IRRIGATION AND DEBRIDEMENT ABSCESS;  Surgeon: Rolm Bookbinder, MD;  Location: [facility];  Service: General;  Laterality: Right;  . IRRIGATION AND DEBRIDEMENT ABSCESS  08/13/2011   Procedure: MINOR INCISION AND DRAINAGE OF ABSCESS;  Surgeon: Gwenyth Ober, MD;  Location: [facility];  Service: General;  Laterality: Right;  Right Breast   . IRRIGATION AND  DEBRIDEMENT ABSCESS  11/17/2011   Procedure: IRRIGATION AND DEBRIDEMENT ABSCESS;  Surgeon: Imogene Burn. Georgette Dover, MD;  Location: [facility];  Service: General;  Laterality: Right;  irrigation and debridement right recurrent breast abscess  . LARYNX SURGERY    . LEFT HEART CATHETERIZATION WITH CORONARY ANGIOGRAM N/A 05/29/2012   Procedure: LEFT HEART CATHETERIZATION WITH CORONARY ANGIOGRAM & PTCA;  Surgeon: Troy Sine, MD;  Location[facility] C[facility] LAB;  Service: Cardiovascular; Bifurcation dRCA-RPAD/PLA & rPDA  PTCA.  Marland Kitchen LEFT HEART CATHETERIZATION WITH CORONARY ANGIOGRAM N/A 11/08/2013   Procedure: LEFT HEART CATHETERIZATION WITH CORONARY ANGIOGRAM and Coronary Stent Intervention;  Surgeon: Leonie Man, MD;  Location: [facility] CATH LAB;  Service: Cardiovascular; mLAD 80% (FFR 0.73) - resolute DES 3.0 x 22 mm (postdilated to 3.3 mm->3.5 mm)  . NM MYOVIEW LTD  01/26/2009   Normal study, no evidence of ischemia, EF 67%  . ployp removed from voice box 03/30/12    . REFRACTIVE SURGERY  ~ 2010   right  . TONSILLECTOMY  1988      Social History:      Social History  Substance Use Topics  . Smoking status: Current Every Day Smoker    Packs/day: 0.25    Years: 30.00    Types: Cigarettes  . Smokeless tobacco: Never Used     Comment: 08/14/11 "quit for 3 wk 05/2011; was smoking 1 ppd since age 84 before I quit; at least I've cut down to 1/4 ppd"  . 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 COPD Mother   . Cancer Mother     lymphoma  . Heart disease Mother   . Hyperlipidemia Father   . Heart disease Father   . Hypertension Maternal Grandmother   . Diabetes Maternal Grandmother   . Cancer Maternal Aunt     breast, colon  . Hypertension Brother       Home Medications:   Prior to Admission medications   Medication Sig Start Date End Date Taking? Authorizing Provider    acyclovir (ZOVIRAX) 400 MG tablet Take 1 tablet (400 mg total) by mouth 3 (three) times daily. 10/14/15  Yes Shelly Bombard, MD  Albiglutide (TANZEUM) 50 MG PEN Inject 50 mg weekly under skin Patient  taking differently: Inject 50 mg into the skin every Sunday.  08/12/15  Yes Philemon Kingdom, MD  albuterol (PROVENTIL HFA;VENTOLIN HFA) 108 (90 Base) MCG/ACT inhaler Inhale 1-2 puffs into the lungs every 6 (six) hours as needed for wheezing or shortness of breath. 03/27/15  Yes Alexandra M Law, PA-C  albuterol (PROVENTIL) (2.5 MG/3ML) 0.083% nebulizer solution Take 2.5 mg by nebulization every 6 (six) hours as needed for wheezing or shortness of breath.   Yes Historical Provider, MD  aspirin EC 81 MG tablet Take 81 mg by mouth daily.   Yes Historical Provider, MD  azithromycin (ZITHROMAX Z-PAK) 250 MG tablet Take 1 tablet (250 mg total) by mouth daily. Take 2 tablets on day 1, then 1 each day for 4 days. 10/13/15 10/18/15 Yes Elveria Rising, MD  baclofen (LIORESAL) 10 MG tablet Take 10 mg by mouth 3 (three) times daily. 10/08/15  Yes Historical Provider, MD  buPROPion (WELLBUTRIN SR) 150 MG 12 hr tablet Take 150 mg by mouth 2 (two) times daily. 09/25/15  Yes Historical Provider, MD  carvedilol (COREG) 6.25 MG tablet Take 1 tablet (6.25 mg total) by mouth 2 (two) times daily with a meal. 04/15/15  Yes Leonie Man, MD  cefUROXime (CEFTIN) 500 MG tablet Take 1 tablet (500 mg total) by mouth 2 (two) times daily with a meal. 10/14/15  Yes Shelly Bombard, MD  clindamycin (CLEOCIN T) 1 % lotion Apply 1 application topically 2 (two) times daily. Apply to eczema on shoulders 08/12/15  Yes Historical Provider, MD  clopidogrel (PLAVIX) 75 MG tablet Take 1 tablet (75 mg total) by mouth daily. 04/15/15  Yes Leonie Man, MD  diclofenac sodium (VOLTAREN) 1 % GEL Apply 1 application topically 3 (three) times daily as needed (pain).   Yes Historical Provider, MD  fluticasone (FLONASE) 50 MCG/ACT nasal spray Place 1 spray  into both nostrils daily as needed (congestion).  12/11/13  Yes Historical Provider, MD  furosemide (LASIX) 20 MG tablet Take 20 mg by mouth daily. 04/10/15  Yes Historical Provider, MD  gabapentin (NEURONTIN) 300 MG capsule Take 300 mg by mouth 3 (three) times daily. 10/08/15  Yes Historical Provider, MD  guaiFENesin 200 MG tablet Take 1 tablet (200 mg total) by mouth every 4 (four) hours as needed for cough or to loosen phlegm. 03/27/15  Yes Alexandra M Law, PA-C  ibuprofen (ADVIL,MOTRIN) 200 MG tablet Take 400 mg by mouth every 6 (six) hours as needed (pain/ inflammation).   Yes Historical Provider, MD  Insulin Detemir (LEVEMIR FLEXTOUCH) 100 UNIT/ML Pen Inject 20 Units into the skin daily at 10 pm. 03/20/15  Yes Philemon Kingdom, MD  losartan (COZAAR) 50 MG tablet Take 50 mg by mouth daily. 09/09/15  Yes Historical Provider, MD  lubiprostone (AMITIZA) 24 MCG capsule Take 24 mcg by mouth 2 (two) times daily with a meal.   Yes Historical Provider, MD  metFORMIN (GLUCOPHAGE-XR) 500 MG 24 hr tablet Take 2 tablets (1,000 mg total) by mouth daily with supper. 08/12/15  Yes Philemon Kingdom, MD  mometasone (ELOCON) 0.1 % cream Apply 1 application topically 2 (two) times daily as needed (wound care (vaginal boils)).  09/09/15  Yes Historical Provider, MD  nitroGLYCERIN (NITROSTAT) 0.4 MG SL tablet Place 1 tablet (0.4 mg total) under the tongue every 5 (five) minutes as needed for chest pain. 04/15/15  Yes Leonie Man, MD  NOVOLOG FLEXPEN 100 UNIT/ML FlexPen INJECT 10-12 UNITS INTO THE SKIN 3 TIMES DAILY WITH MEALS. PLUS SLIDING  SCALE. Patient taking differently: INJECT 12 -15 SUBCUTANEOUSLY BEFORE BREAKFAST AND LUNCH BASED ON CBG 07/09/15  Yes Philemon Kingdom, MD  oxyCODONE-acetaminophen (PERCOCET/ROXICET) 5-325 MG tablet Take 1 tablet by mouth 4 (four) times daily.   Yes Historical Provider, MD  pantoprazole (PROTONIX) 40 MG tablet Take 40 mg by mouth daily. 09/05/15  Yes Historical Provider, MD  simvastatin  (ZOCOR) 40 MG tablet TAKE 1 TABLET BY MOUTH ONCE A DAY AT 6:00 PM 06/11/15  Yes Leonie Man, MD  traZODone (DESYREL) 100 MG tablet Take 50 mg by mouth at bedtime as needed for sleep.    Yes Historical Provider, MD  vitamin B-12 (CYANOCOBALAMIN) 1000 MCG tablet Take 1,000 mcg by mouth daily.   Yes Historical Provider, MD  ACCU-CHEK AVIVA PLUS test strip TEST BLOOD SUGAR THREE TIMES DAILY AS DIRECTED 01/17/15   Philemon Kingdom, MD  fluconazole (DIFLUCAN) 150 MG tablet TAKE 1 TABLET BY MOUTH ONCE Patient not taking: Reported on 10/18/2015 10/14/15   Shelly Bombard, MD  gabapentin (NEURONTIN) 600 MG tablet TAKE 1 TABLET BY MOUTH THREE TIMES DAILY Patient not taking: Reported on 10/18/2015 08/19/15   Philemon Kingdom, MD  Insulin Pen Needle 32G X 4 MM MISC Use to inject insulin 4 times daily. 03/20/15   Philemon Kingdom, MD  Insulin Syringe-Needle U-100 (INSULIN SYRINGE 1CC/31GX5/16") 31G X 5/16" 1 ML MISC USE AS DIRECTED FOUR TIMES DAILY FOR INSULIN INJECTIONS. 04/28/15   Philemon Kingdom, MD  isosorbide mononitrate (IMDUR) 30 MG 24 hr tablet Take 0.5 tablets (15 mg total) by mouth daily. Patient not taking: Reported on 10/18/2015 04/15/15   Leonie Man, MD  phenazopyridine (PYRIDIUM) 100 MG tablet Take 1 tablet (100 mg total) by mouth 3 (three) times daily with meals. Patient not taking: Reported on 10/18/2015 05/22/15   Merryl Hacker, MD     Allergies:     Allergies  Allergen Reactions  . Amoxicillin Hives    Has patient had a PCN reaction causing immediate rash, facial/tongue/throat swelling, SOB or lightheadedness with hypotension: Yes Has patient had a PCN reaction causing severe rash involving mucus membranes or skin necrosis: No Has patient had a PCN reaction that required hospitalization No Has patient had a PCN reaction occurring within the last 10 years: Yes If all of the above answers are "NO", then may proceed with Cephalosporin use.     Physical Exam:   Vitals  Blood pressure  150/76, pulse 94, temperature 98.1 F (36.7 C), temperature source Oral, resp. rate 20, height '5\' 7"'$  (1.702 m), weight 113.9 kg (251 lb), SpO2 97 %.  1.  General: African-American female in no acute distress  2. Psychiatric:  Intact judgement and  insight, awake alert, oriented x 3.  3. Neurologic: No focal neurological deficits, all cranial nerves intact.Strength 5/5 all 4 extremities, sensation intact all 4 extremities, plantars down going.  4. Eyes :  anicteric sclerae, moist conjunctivae with no lid lag. PERRLA.  5. ENMT:  Oropharynx clear with moist mucous membranes and good dentition  6. Neck:  supple, no cervical lymphadenopathy appriciated, No thyromegaly  7. Respiratory : Normal respiratory effort, scattered rhonchi bilaterally   8. Cardiovascular : RRR, no gallops, rubs or murmurs, no leg edema  9. Gastrointestinal:  Positive bowel sounds, abdomen soft, non-tender to palpation,no hepatosplenomegaly, no rigidity or guarding       10. Skin:  No cyanosis, normal texture and turgor, no rash, lesions or ulcers  11.Musculoskeletal:  Good muscle tone,  joints appear normal ,  no effusions,  normal range of motion    Data Review:    CBC  Recent Labs Lab 10/18/15 0806  WBC 12.2*  HGB 13.4  HCT 38.0  PLT 304  MCV 83.9  MCH 29.6  MCHC 35.3  RDW 12.9  LYMPHSABS 3.8  MONOABS 0.6  EOSABS 0.7  BASOSABS 0.0   ------------------------------------------------------------------------------------------------------------------  Chemistries   Recent Labs Lab 10/18/15 0806  NA 137  K 3.7  CL 105  CO2 24  GLUCOSE 179*  BUN 19  CREATININE 0.77  CALCIUM 8.7*  AST 14*  ALT 15  ALKPHOS 91  BILITOT 0.6   ------------------------------------------------------------------------------------------------------------------  ------------------------------------------------------------------------------------------------------------------ GFR: Estimated Creatinine  Clearance: 108.4 mL/min (by C-G formula based on SCr of 0.77 mg/dL). Liver Function Tests:  Recent Labs Lab 10/18/15 0806  AST 14*  ALT 15  ALKPHOS 91  BILITOT 0.6  PROT 7.4  ALBUMIN 4.0   No results for input(s): LIPASE, AMYLASE in the last 168 hours. No results for input(s): AMMONIA in the last 168 hours. Coagulation Profile: No results for input(s): INR, PROTIME in the last 168 hours. Cardiac Enzymes:  Recent Labs Lab 10/18/15 0806  TROPONINI <0.03       Imaging Results:    Dg Chest 2 View  Result Date: 10/18/2015 CLINICAL DATA:  Shortness of breath with chest pain for 3 weeks. Cough and congestion EXAM: CHEST  2 VIEW COMPARISON:  October 13, 2015 FINDINGS: There is no edema or consolidation. The heart size and pulmonary vascularity are normal. No adenopathy. No bone lesions. No pneumothorax. IMPRESSION: No edema or consolidation. Electronically Signed   By: Lowella Grip III M.D.   On: 10/18/2015 09:40    My personal review of EKG: Rhythm NSR   Assessment & Plan:    Active Problems:   Essential hypertension   CAD S/P percutaneous coronary angioplasty   Type II diabetes mellitus with nephropathy (HCC)   Hypoxia   COPD exacerbation ([place])   1. COPD exacerbation- patient is under observation, start Solu-Medrol 60 mg IV every 6 hours, DuoNeb nebs every 6 hours, Mucinex 1 tablet by mouth twice a day.  2. History of CAD status post PCI- stable, continue aspirin, Plavix.  3. Diabetes mellitus- hold metformin, continue Lantus, start sliding scale insulin with NovoLog. 4. History of genital herpes- continue acyclovir 5. UTI- patient was diagnosed with UTI, urine culture on 10/14/2015 showed Escherichia coli sensitive to Cefuroxime, will continue with Cefuroxime. 6. Chronic back pain- continue Percocet 4 times a day   DVT Prophylaxis-   Lovenox  AM Labs Ordered, also please review Full Orders  Family Communication: no family at bedside  Code Status:  Full  code  Admission status: Observation    Time spent in minutes : 60 minutes   Jonai Weyland S M.D on 10/18/2015 at 12:13 PM  Between 7am to 7pm - Pager - [phone]. After 7pm go to [email or web address] - password [facility]  [facility] - Office  [phone]

## 2015-10-18 NOTE — ED Triage Notes (Signed)
Per EMS pt continued cough and SOB for two weeks evaluated for same on Monday. Pt has been given recent flu shot within past week. Hx COPD and recurrent unknown bacterial infections. Pt given 10 mg Albuteral and 0.5 mg of Atrovent en route with EMS.

## 2015-10-19 DIAGNOSIS — Z7902 Long term (current) use of antithrombotics/antiplatelets: Secondary | ICD-10-CM | POA: Diagnosis not present

## 2015-10-19 DIAGNOSIS — M797 Fibromyalgia: Secondary | ICD-10-CM | POA: Diagnosis present

## 2015-10-19 DIAGNOSIS — E1142 Type 2 diabetes mellitus with diabetic polyneuropathy: Secondary | ICD-10-CM | POA: Diagnosis present

## 2015-10-19 DIAGNOSIS — I2510 Atherosclerotic heart disease of native coronary artery without angina pectoris: Secondary | ICD-10-CM | POA: Diagnosis present

## 2015-10-19 DIAGNOSIS — A630 Anogenital (venereal) warts: Secondary | ICD-10-CM | POA: Diagnosis present

## 2015-10-19 DIAGNOSIS — F319 Bipolar disorder, unspecified: Secondary | ICD-10-CM | POA: Diagnosis present

## 2015-10-19 DIAGNOSIS — E785 Hyperlipidemia, unspecified: Secondary | ICD-10-CM | POA: Diagnosis present

## 2015-10-19 DIAGNOSIS — N390 Urinary tract infection, site not specified: Secondary | ICD-10-CM | POA: Diagnosis present

## 2015-10-19 DIAGNOSIS — Z6839 Body mass index (BMI) 39.0-39.9, adult: Secondary | ICD-10-CM | POA: Diagnosis not present

## 2015-10-19 DIAGNOSIS — I10 Essential (primary) hypertension: Secondary | ICD-10-CM

## 2015-10-19 DIAGNOSIS — R0902 Hypoxemia: Secondary | ICD-10-CM | POA: Diagnosis present

## 2015-10-19 DIAGNOSIS — E1121 Type 2 diabetes mellitus with diabetic nephropathy: Secondary | ICD-10-CM | POA: Diagnosis present

## 2015-10-19 DIAGNOSIS — J441 Chronic obstructive pulmonary disease with (acute) exacerbation: Principal | ICD-10-CM

## 2015-10-19 DIAGNOSIS — Z7982 Long term (current) use of aspirin: Secondary | ICD-10-CM | POA: Diagnosis not present

## 2015-10-19 DIAGNOSIS — Z79899 Other long term (current) drug therapy: Secondary | ICD-10-CM | POA: Diagnosis not present

## 2015-10-19 DIAGNOSIS — Z794 Long term (current) use of insulin: Secondary | ICD-10-CM | POA: Diagnosis not present

## 2015-10-19 DIAGNOSIS — K219 Gastro-esophageal reflux disease without esophagitis: Secondary | ICD-10-CM | POA: Diagnosis present

## 2015-10-19 DIAGNOSIS — Z23 Encounter for immunization: Secondary | ICD-10-CM | POA: Diagnosis not present

## 2015-10-19 DIAGNOSIS — M549 Dorsalgia, unspecified: Secondary | ICD-10-CM | POA: Diagnosis present

## 2015-10-19 DIAGNOSIS — B9620 Unspecified Escherichia coli [E. coli] as the cause of diseases classified elsewhere: Secondary | ICD-10-CM | POA: Diagnosis present

## 2015-10-19 DIAGNOSIS — G8929 Other chronic pain: Secondary | ICD-10-CM | POA: Diagnosis present

## 2015-10-19 DIAGNOSIS — Z9861 Coronary angioplasty status: Secondary | ICD-10-CM

## 2015-10-19 DIAGNOSIS — Z880 Allergy status to penicillin: Secondary | ICD-10-CM | POA: Diagnosis not present

## 2015-10-19 DIAGNOSIS — D573 Sickle-cell trait: Secondary | ICD-10-CM | POA: Diagnosis present

## 2015-10-19 DIAGNOSIS — Z7984 Long term (current) use of oral hypoglycemic drugs: Secondary | ICD-10-CM | POA: Diagnosis not present

## 2015-10-19 LAB — CBC
HEMATOCRIT: 37.7 % (ref 36.0–46.0)
HEMOGLOBIN: 12.8 g/dL (ref 12.0–15.0)
MCH: 29.1 pg (ref 26.0–34.0)
MCHC: 34 g/dL (ref 30.0–36.0)
MCV: 85.7 fL (ref 78.0–100.0)
Platelets: 304 10*3/uL (ref 150–400)
RBC: 4.4 MIL/uL (ref 3.87–5.11)
RDW: 12.9 % (ref 11.5–15.5)
WBC: 11.9 10*3/uL — AB (ref 4.0–10.5)

## 2015-10-19 LAB — COMPREHENSIVE METABOLIC PANEL
ALBUMIN: 4 g/dL (ref 3.5–5.0)
ALK PHOS: 86 U/L (ref 38–126)
ALT: 14 U/L (ref 14–54)
AST: 12 U/L — AB (ref 15–41)
Anion gap: 9 (ref 5–15)
BILIRUBIN TOTAL: 0.7 mg/dL (ref 0.3–1.2)
BUN: 17 mg/dL (ref 6–20)
CO2: 22 mmol/L (ref 22–32)
CREATININE: 0.91 mg/dL (ref 0.44–1.00)
Calcium: 9.1 mg/dL (ref 8.9–10.3)
Chloride: 103 mmol/L (ref 101–111)
GFR calc Af Amer: >60 mL/min (ref >60)
GFR calc non Af Amer: >60 mL/min (ref >60)
GLUCOSE: 371 mg/dL — AB (ref 65–99)
Potassium: 4.3 mmol/L (ref 3.5–5.1)
Sodium: 134 mmol/L — ABNORMAL LOW (ref 135–145)
Total Protein: 7.2 g/dL (ref 6.5–8.1)

## 2015-10-19 LAB — GLUCOSE, CAPILLARY
GLUCOSE-CAPILLARY: 282 mg/dL — AB (ref 65–99)
GLUCOSE-CAPILLARY: 409 mg/dL — AB (ref 65–99)
GLUCOSE-CAPILLARY: 278 mg/dL — AB (ref 65–99)
Glucose-Capillary: 415 mg/dL — ABNORMAL HIGH (ref 65–99)

## 2015-10-19 LAB — HEMOGLOBIN A1C
HEMOGLOBIN A1C: 7.7 % — AB (ref 4.8–5.6)
MEAN PLASMA GLUCOSE: 174 mg/dL

## 2015-10-19 MED ORDER — INSULIN DETEMIR 100 UNIT/ML ~~LOC~~ SOLN
24.0000 [IU] | Freq: Every day | SUBCUTANEOUS | Status: DC
  Administered 2015-10-19: 24 [IU] via SUBCUTANEOUS
  Filled 2015-10-19 (×2): qty 0.24

## 2015-10-19 MED ORDER — INSULIN ASPART 100 UNIT/ML ~~LOC~~ SOLN
25.0000 [IU] | Freq: Once | SUBCUTANEOUS | Status: AC
  Administered 2015-10-19: 25 [IU] via SUBCUTANEOUS

## 2015-10-19 MED ORDER — INSULIN ASPART 100 UNIT/ML ~~LOC~~ SOLN
4.0000 [IU] | Freq: Three times a day (TID) | SUBCUTANEOUS | Status: DC
  Administered 2015-10-19 – 2015-10-21 (×6): 4 [IU] via SUBCUTANEOUS

## 2015-10-19 MED ORDER — LORATADINE 10 MG PO TABS
10.0000 mg | ORAL_TABLET | Freq: Every day | ORAL | Status: DC
  Administered 2015-10-19 – 2015-10-21 (×3): 10 mg via ORAL
  Filled 2015-10-19 (×3): qty 1

## 2015-10-19 MED ORDER — METHYLPREDNISOLONE SODIUM SUCC 125 MG IJ SOLR
60.0000 mg | Freq: Three times a day (TID) | INTRAMUSCULAR | Status: DC
  Administered 2015-10-19 – 2015-10-20 (×3): 60 mg via INTRAVENOUS
  Filled 2015-10-19 (×3): qty 2

## 2015-10-19 MED ORDER — BUDESONIDE 0.25 MG/2ML IN SUSP
0.2500 mg | Freq: Two times a day (BID) | RESPIRATORY_TRACT | Status: DC
  Administered 2015-10-19 – 2015-10-21 (×4): 0.25 mg via RESPIRATORY_TRACT
  Filled 2015-10-19 (×4): qty 2

## 2015-10-19 NOTE — Progress Notes (Addendum)
PROGRESS NOTE    LUBNA STEGEMAN  ERX:[id number] DOB: [birth year] DOA: 10/18/2015 PCP: Javier Docker, MD    Brief Narrative:  Cassidy [name]  is a 50 y.o. female, With history of diabetes mellitus, CAD status post DES to LAD, depression, tobacco abuse, recent diagnosis of COPD came to hospital with worsening shortness of breath following up with his doctor symptoms of stuffy nose. Patient was seen in the ED 2 days ago and was prescribed nebulizer treatment, consider that she did not improve and that and infected "worse. She also has been coughing up white to yellow colored phlegm but bloody streaks, denies chest pain. No nausea vomiting or diarrhea.  In the ED patient became hypoxic with O2 sats 86-87% on room air and requiring oxygen via nasal cannula. Chest x-ray shows no pneumonia    Assessment & Plan:   Principal Problem:   COPD exacerbation ([place]) Active Problems:   Essential hypertension   CAD S/P percutaneous coronary angioplasty   Type II diabetes mellitus with nephropathy ([place])   Hypoxia   #1 acute COPD exacerbation with bronchitis Patient with clinical improvement. Afebrile. WBC trending down. Continue scheduled nebs, Ceftin, IV steroid taper. At Pulmicort. Continue oxygen, PPI. Follow.  #2 hypertension Continue home regimen of Cozaar, Coreg.  #3 type 2 diabetes  Hgba1c = 7.7. CBGs have ranged from 280-249. Patient on IV steroids. Increase Levemir to 24 units daily. Continue resistant sliding scale. Place on meal coverage insulin.  #4 coronary artery disease status post PTCA Stable. Patient asymptomatic. Continue cardiac regimen of Coreg, Cozaar. Outpatient follow-up.     DVT prophylaxis:  Code Status: Full Family Communication: Updated patient. No family at bedside. Disposition Plan: Home in medically stable and clinically improved.   Consultants:   None  Procedures:   Chest x-ray 10/18/2015  Antimicrobials:   Ceftin  10/18/2015     Subjective: Patient complaining of a productive cough of yellowish sputum. Some improvement with shortness of breath and wheezing.  Objective: Vitals:   10/18/15 1949 10/18/15 2059 10/19/15 0528 10/19/15 0604  BP:  (!) 162/98  (!) 154/92  Pulse:  80  87  Resp:  18  18  Temp:  97.7 F (36.5 C)  98 F (36.7 C)  TempSrc:  Oral  Oral  SpO2: 98% 97% 95% 94%  Weight:      Height:        Intake/Output Summary (Last 24 hours) at 10/19/15 1150 Last data filed at 10/18/15 1822  Gross per 24 hour  Intake              518 ml  Output              900 ml  Net             -382 ml   Filed Weights   10/18/15 0744  Weight: 113.9 kg (251 lb)    Examination:  General exam: Appears calm and comfortable  Respiratory system: Scattered coarse breath sounds. Minimal to mild extent 30 wheezing. Respiratory effort normal. Cardiovascular system: S1 & S2 heard, RRR. No JVD, murmurs, rubs, gallops or clicks. No pedal edema. Gastrointestinal system: Abdomen is nondistended, soft and nontender. No organomegaly or masses felt. Normal bowel sounds heard. Central nervous system: Alert and oriented. No focal neurological deficits. Extremities: Symmetric 5 x 5 power. Skin: No rashes, lesions or ulcers Psychiatry: Judgement and insight appear normal. Mood & affect appropriate.     Data Reviewed: I have personally reviewed following labs and  imaging studies  CBC:  Recent Labs Lab 10/18/15 0806 10/19/15 0428  WBC 12.2* 11.9*  NEUTROABS 7.1  --   HGB 13.4 12.8  HCT 38.0 37.7  MCV 83.9 85.7  PLT 304 830   Basic Metabolic Panel:  Recent Labs Lab 10/18/15 0806 10/19/15 0428  NA 137 134*  K 3.7 4.3  CL 105 103  CO2 24 22  GLUCOSE 179* 371*  BUN 19 17  CREATININE 0.77 0.91  CALCIUM 8.7* 9.1   GFR: Estimated Creatinine Clearance: 95.3 mL/min (by C-G formula based on SCr of 0.91 mg/dL). Liver Function Tests:  Recent Labs Lab 10/18/15 0806 10/19/15 0428  AST 14*  12*  ALT 15 14  ALKPHOS 91 86  BILITOT 0.6 0.7  PROT 7.4 7.2  ALBUMIN 4.0 4.0   No results for input(s): LIPASE, AMYLASE in the last 168 hours. No results for input(s): AMMONIA in the last 168 hours. Coagulation Profile: No results for input(s): INR, PROTIME in the last 168 hours. Cardiac Enzymes:  Recent Labs Lab 10/18/15 0806  TROPONINI <0.03   BNP (last 3 results) No results for input(s): PROBNP in the last 8760 hours. HbA1C:  Recent Labs  10/18/15 0806  HGBA1C 7.7*   CBG:  Recent Labs Lab 10/18/15 1320 10/18/15 1651 10/18/15 2153 10/19/15 0758  GLUCAP 259* 396* 300* 282*   Lipid Profile: No results for input(s): CHOL, HDL, LDLCALC, TRIG, CHOLHDL, LDLDIRECT in the last 72 hours. Thyroid Function Tests: No results for input(s): TSH, T4TOTAL, FREET4, T3FREE, THYROIDAB in the last 72 hours. Anemia Panel: No results for input(s): VITAMINB12, FOLATE, FERRITIN, TIBC, IRON, RETICCTPCT in the last 72 hours. Sepsis Labs: No results for input(s): PROCALCITON, LATICACIDVEN in the last 168 hours.  Recent Results (from the past 240 hour(s))  Urine culture     Status: Abnormal   Collection Time: 10/14/15  1:04 PM  Result Value Ref Range Status   Urine Culture, Routine Final report (A)  Final   Urine Culture result 1 Escherichia coli (A)  Final    Comment: Greater than 100,000 colony forming units per mL Cefazolin <=4 ug/mL Cefazolin with an MIC <=16 predicts susceptibility to the oral agents cefaclor, cefdinir, cefpodoxime, cefprozil, cefuroxime, cephalexin, and loracarbef when used for therapy of uncomplicated urinary tract infections due to E. coli, Klebsiella pneumoniae, and Proteus mirabilis.    ANTIMICROBIAL SUSCEPTIBILITY Comment  Final    Comment:       ** S = Susceptible; I = Intermediate; R = Resistant **                    P = Positive; N = Negative             MICS are expressed in micrograms per mL    Antibiotic                 RSLT#1    RSLT#2     RSLT#3    RSLT#4 Amoxicillin/Clavulanic Acid    S =4 Ampicillin                     R>=32 Cefepime                       S<=1 Ceftriaxone                    S<=1 Cefuroxime  S =2 Cephalothin                    S =8 Ciprofloxacin                  R>=4 Ertapenem                      S<=0.5 Gentamicin                     R>=16 Imipenem                       S<=1 Levofloxacin                   R>=8 Nitrofurantoin                 S<=16 Piperacillin                   R>=128 Tetracycline                   R>=16 Tobramycin                     I =8 Trimethoprim/Sulfa             R>=320          Radiology Studies: Dg Chest 2 View  Result Date: 10/18/2015 CLINICAL DATA:  Shortness of breath with chest pain for 3 weeks. Cough and congestion EXAM: CHEST  2 VIEW COMPARISON:  October 13, 2015 FINDINGS: There is no edema or consolidation. The heart size and pulmonary vascularity are normal. No adenopathy. No bone lesions. No pneumothorax. IMPRESSION: No edema or consolidation. Electronically Signed   By: Lowella Grip III M.D.   On: 10/18/2015 09:40        Scheduled Meds: . acyclovir  400 mg Oral TID  . albuterol  5 mg Nebulization Once  . aspirin EC  81 mg Oral Daily  . baclofen  10 mg Oral TID  . budesonide (PULMICORT) nebulizer solution  0.25 mg Nebulization BID  . buPROPion  150 mg Oral BID  . carvedilol  6.25 mg Oral BID WC  . cefUROXime  500 mg Oral BID WC  . clopidogrel  75 mg Oral Daily  . enoxaparin (LOVENOX) injection  60 mg Subcutaneous Daily  . gabapentin  300 mg Oral TID  . guaiFENesin  1,200 mg Oral BID  . insulin aspart  0-20 Units Subcutaneous TID WC  . insulin detemir  20 Units Subcutaneous Q2200  . ipratropium-albuterol  3 mL Nebulization TID  . losartan  50 mg Oral Daily  . lubiprostone  24 mcg Oral BID WC  . mouth rinse  15 mL Mouth Rinse BID  . methylPREDNISolone (SOLU-MEDROL) injection  60 mg Intravenous Q6H  . oxyCODONE-acetaminophen   1 tablet Oral QID  . pantoprazole  40 mg Oral Daily  . pneumococcal 23 valent vaccine  0.5 mL Intramuscular Tomorrow-1000  . simvastatin  40 mg Oral q1800  . vitamin B-12  1,000 mcg Oral Daily   Continuous Infusions: . sodium chloride 10 mL/hr at 10/18/15 1412     LOS: 0 days    Time spent: 50 mins    Ruthe Roemer, MD [facility] Pager [phone]  If 7PM-7AM, please contact night-coverage [email or web address] Password [email or web address] 10/19/2015, 11:50 AM

## 2015-10-20 ENCOUNTER — Encounter: Payer: Self-pay | Admitting: Obstetrics

## 2015-10-20 DIAGNOSIS — J441 Chronic obstructive pulmonary disease with (acute) exacerbation: Secondary | ICD-10-CM

## 2015-10-20 DIAGNOSIS — R0902 Hypoxemia: Secondary | ICD-10-CM

## 2015-10-20 LAB — GLUCOSE, CAPILLARY
GLUCOSE-CAPILLARY: 304 mg/dL — AB (ref 65–99)
Glucose-Capillary: 269 mg/dL — ABNORMAL HIGH (ref 65–99)
Glucose-Capillary: 366 mg/dL — ABNORMAL HIGH (ref 65–99)
Glucose-Capillary: 249 mg/dL — ABNORMAL HIGH (ref 65–99)

## 2015-10-20 LAB — BASIC METABOLIC PANEL
Anion gap: 7 (ref 5–15)
BUN: 23 mg/dL — AB (ref 6–20)
CHLORIDE: 105 mmol/L (ref 101–111)
CO2: 23 mmol/L (ref 22–32)
Calcium: 9.1 mg/dL (ref 8.9–10.3)
Creatinine, Ser: 0.81 mg/dL (ref 0.44–1.00)
GFR calc Af Amer: >60 mL/min (ref >60)
GFR calc non Af Amer: >60 mL/min (ref >60)
GLUCOSE: 340 mg/dL — AB (ref 65–99)
POTASSIUM: 4.1 mmol/L (ref 3.5–5.1)
Sodium: 135 mmol/L (ref 135–145)

## 2015-10-20 LAB — CBC
HCT: 36.3 % (ref 36.0–46.0)
HEMOGLOBIN: 12.4 g/dL (ref 12.0–15.0)
MCH: 29.2 pg (ref 26.0–34.0)
MCHC: 34.2 g/dL (ref 30.0–36.0)
MCV: 85.6 fL (ref 78.0–100.0)
Platelets: 302 10*3/uL (ref 150–400)
RBC: 4.24 MIL/uL (ref 3.87–5.11)
RDW: 13.1 % (ref 11.5–15.5)
WBC: 20.2 10*3/uL — ABNORMAL HIGH (ref 4.0–10.5)

## 2015-10-20 MED ORDER — INSULIN DETEMIR 100 UNIT/ML ~~LOC~~ SOLN
30.0000 [IU] | Freq: Every day | SUBCUTANEOUS | Status: DC
  Administered 2015-10-20: 30 [IU] via SUBCUTANEOUS
  Filled 2015-10-20 (×2): qty 0.3

## 2015-10-20 MED ORDER — METHYLPREDNISOLONE SODIUM SUCC 125 MG IJ SOLR
60.0000 mg | Freq: Two times a day (BID) | INTRAMUSCULAR | Status: DC
  Administered 2015-10-21: 60 mg via INTRAVENOUS
  Filled 2015-10-20: qty 2

## 2015-10-20 MED ORDER — IPRATROPIUM-ALBUTEROL 0.5-2.5 (3) MG/3ML IN SOLN
3.0000 mL | Freq: Two times a day (BID) | RESPIRATORY_TRACT | Status: DC
  Administered 2015-10-20 – 2015-10-21 (×2): 3 mL via RESPIRATORY_TRACT
  Filled 2015-10-20 (×2): qty 3

## 2015-10-20 NOTE — Progress Notes (Signed)
PROGRESS NOTE    Cassidy CHARLIE  [name]:[id number] DOB: 1965-11-14 DOA: 10/18/2015 PCP: Javier Docker, MD    Brief Narrative:  Cassidy [name]  is a 50 y.o. female, With history of diabetes mellitus, CAD status post DES to LAD, depression, tobacco abuse, recent diagnosis of COPD came to hospital with worsening shortness of breath following up with his doctor symptoms of stuffy nose. Patient was seen in the ED 2 days ago and was prescribed nebulizer treatment, consider that she did not improve and that and infected "worse. She also has been coughing up white to yellow colored phlegm but bloody streaks, denies chest pain. No nausea vomiting or diarrhea.  In the ED patient became hypoxic with O2 sats 86-87% on room air and requiring oxygen via nasal cannula. Chest x-ray shows no pneumonia    Assessment & Plan:   Principal Problem:   COPD exacerbation ([place]) Active Problems:   Essential hypertension   CAD S/P percutaneous coronary angioplasty   Type II diabetes mellitus with nephropathy ([place])   Hypoxia   #1 acute COPD exacerbation with bronchitis Patient with clinical improvement. Afebrile. WBC fluctuating secondary to steriods.. Continue scheduled nebs, Ceftin, IV steroid taper,claritin,flonase, Pulmicort. Continue oxygen, PPI. Follow.  #2 hypertension Continue home regimen of Cozaar, Coreg.  #3 type 2 diabetes  Hgba1c = 7.7. CBGs have ranged from 269-304. Patient on IV steroids taper. Increase Levemir to 30 units daily. Continue resistant sliding scale. Continue meal coverage insulin.  #4 coronary artery disease status post PTCA Stable. Patient asymptomatic. Continue cardiac regimen of Coreg, Cozaar. Outpatient follow-up.     DVT prophylaxis:  Code Status: Full Family Communication: Updated patient. No family at bedside. Disposition Plan: Home when medically stable and clinically improved, hopefully in 24-48 hours.   Consultants:   None  Procedures:   Chest  x-ray 10/18/2015  Antimicrobials:   Ceftin 10/18/2015     Subjective: Patient coughing. Some improvement with shortness of breath and wheezing.  Objective: Vitals:   10/19/15 1944 10/19/15 2200 10/20/15 0600 10/20/15 0825  BP:  (!) 140/92 136/75   Pulse:  84 84 86  Resp:  '18 18 18  '$ Temp:  98.5 F (36.9 C) 98 F (36.7 C)   TempSrc:  Oral Oral   SpO2: 95% 95% 92% 93%  Weight:      Height:        Intake/Output Summary (Last 24 hours) at 10/20/15 1216 Last data filed at 10/19/15 1242  Gross per 24 hour  Intake                0 ml  Output              925 ml  Net             -925 ml   Filed Weights   10/18/15 0744  Weight: 113.9 kg (251 lb)    Examination:  General exam: Appears calm and comfortable  Respiratory system: Scattered coarse breath sounds. Minimal to mild expiratory wheezing. Respiratory effort normal. Cardiovascular system: S1 & S2 heard, RRR. No JVD, murmurs, rubs, gallops or clicks. No pedal edema. Gastrointestinal system: Abdomen is nondistended, soft and nontender. No organomegaly or masses felt. Normal bowel sounds heard. Central nervous system: Alert and oriented. No focal neurological deficits. Extremities: Symmetric 5 x 5 power. Skin: No rashes, lesions or ulcers Psychiatry: Judgement and insight appear normal. Mood & affect appropriate.     Data Reviewed: I have personally reviewed following labs and imaging studies  CBC:  Recent Labs Lab 10/18/15 0806 10/19/15 0428 10/20/15 0354  WBC 12.2* 11.9* 20.2*  NEUTROABS 7.1  --   --   HGB 13.4 12.8 12.4  HCT 38.0 37.7 36.3  MCV 83.9 85.7 85.6  PLT 304 304 169   Basic Metabolic Panel:  Recent Labs Lab 10/18/15 0806 10/19/15 0428 10/20/15 0354  NA 137 134* 135  K 3.7 4.3 4.1  CL 105 103 105  CO2 '24 22 23  '$ GLUCOSE 179* 371* 340*  BUN 19 17 23*  CREATININE 0.77 0.91 0.81  CALCIUM 8.7* 9.1 9.1   GFR: Estimated Creatinine Clearance: 107 mL/min (by C-G formula based on SCr of  0.81 mg/dL). Liver Function Tests:  Recent Labs Lab 10/18/15 0806 10/19/15 0428  AST 14* 12*  ALT 15 14  ALKPHOS 91 86  BILITOT 0.6 0.7  PROT 7.4 7.2  ALBUMIN 4.0 4.0   No results for input(s): LIPASE, AMYLASE in the last 168 hours. No results for input(s): AMMONIA in the last 168 hours. Coagulation Profile: No results for input(s): INR, PROTIME in the last 168 hours. Cardiac Enzymes:  Recent Labs Lab 10/18/15 0806  TROPONINI <0.03   BNP (last 3 results) No results for input(s): PROBNP in the last 8760 hours. HbA1C:  Recent Labs  10/18/15 0806  HGBA1C 7.7*   CBG:  Recent Labs Lab 10/19/15 0758 10/19/15 1214 10/19/15 1558 10/19/15 2233 10/20/15 0802  GLUCAP 282* 409* 278* 415* 304*   Lipid Profile: No results for input(s): CHOL, HDL, LDLCALC, TRIG, CHOLHDL, LDLDIRECT in the last 72 hours. Thyroid Function Tests: No results for input(s): TSH, T4TOTAL, FREET4, T3FREE, THYROIDAB in the last 72 hours. Anemia Panel: No results for input(s): VITAMINB12, FOLATE, FERRITIN, TIBC, IRON, RETICCTPCT in the last 72 hours. Sepsis Labs: No results for input(s): PROCALCITON, LATICACIDVEN in the last 168 hours.  Recent Results (from the past 240 hour(s))  Urine culture     Status: Abnormal   Collection Time: 10/14/15  1:04 PM  Result Value Ref Range Status   Urine Culture, Routine Final report (A)  Final   Urine Culture result 1 Escherichia coli (A)  Final    Comment: Greater than 100,000 colony forming units per mL Cefazolin <=4 ug/mL Cefazolin with an MIC <=16 predicts susceptibility to the oral agents cefaclor, cefdinir, cefpodoxime, cefprozil, cefuroxime, cephalexin, and loracarbef when used for therapy of uncomplicated urinary tract infections due to E. coli, Klebsiella pneumoniae, and Proteus mirabilis.    ANTIMICROBIAL SUSCEPTIBILITY Comment  Final    Comment:       ** S = Susceptible; I = Intermediate; R = Resistant **                    P = Positive; N =  Negative             MICS are expressed in micrograms per mL    Antibiotic                 RSLT#1    RSLT#2    RSLT#3    RSLT#4 Amoxicillin/Clavulanic Acid    S =4 Ampicillin                     R>=32 Cefepime                       S<=1 Ceftriaxone  S<=1 Cefuroxime                     S =2 Cephalothin                    S =8 Ciprofloxacin                  R>=4 Ertapenem                      S<=0.5 Gentamicin                     R>=16 Imipenem                       S<=1 Levofloxacin                   R>=8 Nitrofurantoin                 S<=16 Piperacillin                   R>=128 Tetracycline                   R>=16 Tobramycin                     I =8 Trimethoprim/Sulfa             R>=320          Radiology Studies: No results found.      Scheduled Meds: . acyclovir  400 mg Oral TID  . albuterol  5 mg Nebulization Once  . aspirin EC  81 mg Oral Daily  . baclofen  10 mg Oral TID  . budesonide (PULMICORT) nebulizer solution  0.25 mg Nebulization BID  . buPROPion  150 mg Oral BID  . carvedilol  6.25 mg Oral BID WC  . cefUROXime  500 mg Oral BID WC  . clopidogrel  75 mg Oral Daily  . enoxaparin (LOVENOX) injection  60 mg Subcutaneous Daily  . gabapentin  300 mg Oral TID  . guaiFENesin  1,200 mg Oral BID  . insulin aspart  0-20 Units Subcutaneous TID WC  . insulin aspart  4 Units Subcutaneous TID WC  . insulin detemir  24 Units Subcutaneous Q2200  . ipratropium-albuterol  3 mL Nebulization TID  . loratadine  10 mg Oral Daily  . losartan  50 mg Oral Daily  . lubiprostone  24 mcg Oral BID WC  . mouth rinse  15 mL Mouth Rinse BID  . methylPREDNISolone (SOLU-MEDROL) injection  60 mg Intravenous Q8H  . oxyCODONE-acetaminophen  1 tablet Oral QID  . pantoprazole  40 mg Oral Daily  . simvastatin  40 mg Oral q1800  . vitamin B-12  1,000 mcg Oral Daily   Continuous Infusions: . sodium chloride 10 mL/hr at 10/19/15 1512     LOS: 1 day    Time spent:  30 mins    Jaelie Aguilera, MD [facility] Pager [phone]  If 7PM-7AM, please contact night-coverage [email or web address] Password [email or web address] 10/20/2015, 12:16 PM

## 2015-10-20 NOTE — Progress Notes (Signed)
Subjective:        Cassidy [name] is a 50 y.o. female here for a routine exam.  Current complaints: none.    Personal health questionnaire:  Is patient [name], have a family history of breast and/or ovarian cancer: no Is there a family history of uterine cancer diagnosed at age < 74, gastrointestinal cancer, urinary tract cancer, family member who is a [name]: no Is the patient overweight and hypertensive, family history of diabetes, personal history of gestational diabetes, preeclampsia or PCOS: no Is patient over 80, have PCOS,  family history of premature CHD under age 66, diabetes, smoke, have hypertension or peripheral artery disease:  no At any time, has a partner hit, kicked or otherwise hurt or frightened you?: no Over the past 2 weeks, have you felt down, depressed or hopeless?: no Over the past 2 weeks, have you felt little interest or pleasure in doing things?:no   Gynecologic History No LMP recorded. Contraception: condoms Last Pap: 2016. Results were: normal Last mammogram: 2017. Results were: normal  Obstetric History OB History  Gravida Para Term Preterm AB Living  '1 1 1     1  '$ SAB TAB Ectopic Multiple Live Births               # Outcome Date GA Lbr Len/2nd Weight Sex Delivery Anes PTL Lv  1 Term               Past Medical History:  Diagnosis Date  . Anemia   . Atherosclerotic heart disease of native coronary artery with angina pectoris ([place]) 05/2012, 10/2013   a.  s/p PCTA to dRCA and ostial RPAV-PLA vessel + PDA branch (05/2012)  b. [place] s/p DES to mLAD - Resolute DES 3.0 x 22 (3.4m -->3.3 mm)  . Bipolar depression ([place]   . Breast abscess    a. right side.   .Marland KitchenCOPD (chronic obstructive pulmonary disease) ([place]   . Depression   . Diabetic peripheral neuropathy ([place]   . Fibromyalgia   . Genital warts   . GERD (gastroesophageal reflux disease)   . HLD (hyperlipidemia)   . Hypertension   . Pain in limb    a. LE  VENOUS DUPLEX, 02/05/2009 - no evidence of deep vein thrombosis, Baker's cyst  . PTSD (post-traumatic stress disorder)   . Sickle cell trait ([place]   . Tobacco abuse   . Tooth caries   . Type II diabetes mellitus ([place]     Past Surgical History:  Procedure Laterality Date  . BLADDER SURGERY  1980   "TVT"  . BREAST SURGERY Right    I&D for multiple abscesses  . cutting balloon    . EYE SURGERY Left    laser surgery  . INCISE AND DRAIN ABCESS  ; 04/26/11; 08/13/11   right breast  . INCISION AND DRAINAGE ABSCESS Right 05/09/2012   Procedure: INCISION AND DRAINAGE RIGHT BREAST ABSCESS;  Surgeon: MImogene Burn TGeorgette Dover MD;  [facility]  Service: General;  Laterality: Right;  . INCISION AND DRAINAGE ABSCESS Right 02/08/2014   Procedure: INCISION AND DRAINAGE RIGHT BREAST ABSCESS;  Surgeon: MDonnie Mesa MD;  [facility]  Service: General;  Laterality: Right;  . INCISION AND DRAINAGE ABSCESS Right 06/14/2014   Procedure: INCISION AND DRAINAGE RIGHT BREAST ABSCESS;  Surgeon: MDonnie Mesa MD;  [facility]  Service: General;  Laterality: Right;  . IRRIGATION AND DEBRIDEMENT ABSCESS  12/19/2010   Procedure: IRRIGATION AND DEBRIDEMENT ABSCESS;  Surgeon: Rolm Bookbinder, MD;  Location: [place];  Service: General;  Laterality: Right;  . IRRIGATION AND DEBRIDEMENT ABSCESS  08/13/2011   Procedure: MINOR INCISION AND DRAINAGE OF ABSCESS;  Surgeon: Gwenyth Ober, MD;  Location[place] [facility] [place];  Service: General;  Laterality: Right;  Right Breast   . IRRIGATION AND DEBRIDEMENT ABSCESS  11/17/2011   Procedure: IRRIGATION AND DEBRIDEMENT ABSCESS;  Surgeon: Imogene Burn. Georgette Dover, MD;  Location: [facility];  Service: General;  Laterality: Right;  irrigation and debridement right recurrent breast abscess  . LARYNX SURGERY    . LEFT HEART CATHETERIZATION WITH CORONARY ANGIOGRAM N/A 05/29/2012   Procedure: LEFT HEART CATHETERIZATION WITH CORONARY ANGIOGRAM & PTCA;  Surgeon: Troy Sine, MD;  Location: [facility] CATH LAB;  Service:  Cardiovascular; Bifurcation dRCA-RPAD/PLA & rPDA  PTCA.  Marland Kitchen LEFT HEART CATHETERIZATION WITH CORONARY ANGIOGRAM N/A 11/08/2013   Procedure: LEFT HEART CATHETERIZATION WITH CORONARY ANGIOGRAM and Coronary Stent Intervention;  Surgeon: Leonie Man, MD;  Location: [facility] C[facility] LAB;  Service: Cardiovascular; mLAD 80% (FFR 0.73) - resolute DES 3.0 x 22 mm (postdilated to 3.3 mm->3.5 mm)  . NM MYOVIEW LTD  01/26/2009   Normal study, no evidence of ischemia, EF 67%  . ployp removed from voice box 03/30/12    . REFRACTIVE SURGERY  ~ 2010   right  . TONSILLECTOMY  1988    No current facility-administered medications for this visit.  No current outpatient prescriptions on file.  Facility-Administered Medications Ordered in Other Visits:  .  0.9 %  sodium chloride infusion, , Intravenous, Continuous, Oswald Hillock, MD, Last Rate: 10 mL/hr at 10/19/15 1512 .  acetaminophen (TYLENOL) tablet 650 mg, 650 mg, Oral, Q6H PRN **OR** acetaminophen (TYLENOL) suppository 650 mg, 650 mg, Rectal, Q6H PRN, Oswald Hillock, MD .  acyclovir (ZOVIRAX) tablet 400 mg, 400 mg, Oral, TID, Oswald Hillock, MD, 400 mg at 10/20/15 1657 .  albuterol (PROVENTIL) (2.5 MG/3ML) 0.083% nebulizer solution 2.5 mg, 2.5 mg, Nebulization, Q4H PRN, Oswald Hillock, MD, 2.5 mg at 10/20/15 1317 .  albuterol (PROVENTIL) (2.5 MG/3ML) 0.083% nebulizer solution 5 mg, 5 mg, Nebulization, Once, Merrily Pew, MD .  aspirin EC tablet 81 mg, 81 mg, Oral, Daily, Oswald Hillock, MD, 81 mg at 10/20/15 1008 .  baclofen (LIORESAL) tablet 10 mg, 10 mg, Oral, TID, Oswald Hillock, MD, 10 mg at 10/20/15 1657 .  budesonide (PULMICORT) nebulizer solution 0.25 mg, 0.25 mg, Nebulization, BID, Eugenie Filler, MD, 0.25 mg at 10/20/15 0825 .  buPROPion [facility] SR) 12 hr tablet 150 mg, 150 mg, Oral, BID, Oswald Hillock, MD, 150 mg at 10/20/15 1007 .  carvedilol (COREG) tablet 6.25 mg, 6.25 mg, Oral, BID WC, Oswald Hillock, MD, 6.25 mg at 10/20/15 1656 .  cefUROXime (CEFTIN) tablet  500 mg, 500 mg, Oral, BID WC, Oswald Hillock, MD, 500 mg at 10/20/15 1903 .  clopidogrel (PLAVIX) tablet 75 mg, 75 mg, Oral, Daily, Oswald Hillock, MD, 75 mg at 10/20/15 0823 .  enoxaparin (LOVENOX) injection 60 mg, 60 mg, Subcutaneous, Daily, Oswald Hillock, MD, 60 mg at 10/20/15 1026 .  fluticasone (FLONASE) 50 MCG/ACT nasal spray 1 spray, 1 spray, Each Nare, Daily PRN, Oswald Hillock, MD, 1 spray at 10/18/15 1409 .  gabapentin (NEURONTIN) capsule 300 mg, 300 mg, Oral, TID, Oswald Hillock, MD, 300 mg at 10/20/15 1657 .  guaiFENesin (MUCINEX) 12 hr tablet 1,200 mg, 1,200 mg, Oral, BID, Oswald Hillock, MD, 1,200  mg at 10/20/15 1005 .  insulin aspart (novoLOG) injection 0-20 Units, 0-20 Units, Subcutaneous, TID WC, Oswald Hillock, MD, 20 Units at 10/20/15 1705 .  insulin aspart (novoLOG) injection 4 Units, 4 Units, Subcutaneous, TID WC, Eugenie Filler, MD, 4 Units at 10/20/15 1706 .  insulin detemir (LEVEMIR) injection 30 Units, 30 Units, Subcutaneous, Q2200, Eugenie Filler, MD .  ipratropium-albuterol (DUONEB) 0.5-2.5 (3) MG/3ML nebulizer solution 3 mL, 3 mL, Nebulization, BID, Eugenie Filler, MD .  loratadine (CLARITIN) tablet 10 mg, 10 mg, Oral, Daily, Eugenie Filler, MD, 10 mg at 10/20/15 1008 .  losartan (COZAAR) tablet 50 mg, 50 mg, Oral, Daily, Oswald Hillock, MD, 50 mg at 10/20/15 1007 .  lubiprostone (AMITIZA) capsule 24 mcg, 24 mcg, Oral, BID WC, Oswald Hillock, MD, 24 mcg at 10/20/15 1903 .  MEDLINE mouth rinse, 15 mL, Mouth Rinse, BID, Oswald Hillock, MD, 15 mL at 10/19/15 2306 .  methylPREDNISolone sodium succinate (SOLU-MEDROL) 125 mg/2 mL injection 60 mg, 60 mg, Intravenous, Q12H, Eugenie Filler, MD .  morphine 2 MG/ML injection 1 mg, 1 mg, Intravenous, Q4H PRN, Merton Border, MD, 1 mg at 10/20/15 1024 .  ondansetron (ZOFRAN) tablet 4 mg, 4 mg, Oral, Q6H PRN, 4 mg at 10/20/15 0108 **OR** ondansetron (ZOFRAN) injection 4 mg, 4 mg, Intravenous, Q6H PRN, Oswald Hillock, MD .   oxyCODONE-acetaminophen (PERCOCET/ROXICET) 5-325 MG per tablet 1 tablet, 1 tablet, Oral, QID, Oswald Hillock, MD, 1 tablet at 10/20/15 1858 .  pantoprazole (PROTONIX) EC tablet 40 mg, 40 mg, Oral, Daily, Oswald Hillock, MD, 40 mg at 10/20/15 1006 .  simvastatin (ZOCOR) tablet 40 mg, 40 mg, Oral, q1800, Oswald Hillock, MD, 40 mg at 10/19/15 1610 .  traZODone (DESYREL) tablet 50 mg, 50 mg, Oral, QHS PRN, Oswald Hillock, MD, 50 mg at 10/19/15 2239 .  vitamin B-12 (CYANOCOBALAMIN) tablet 1,000 mcg, 1,000 mcg, Oral, Daily, Oswald Hillock, MD, 1,000 mcg at 10/20/15 1006 Allergies  Allergen Reactions  . Amoxicillin Hives    Has patient had a PCN reaction causing immediate rash, facial/tongue/throat swelling, SOB or lightheadedness with hypotension: Yes Has patient had a PCN reaction causing severe rash involving mucus membranes or skin necrosis: No Has patient had a PCN reaction that required hospitalization No Has patient had a PCN reaction occurring within the last 10 years: Yes If all of the above answers are "NO", then may proceed with Cephalosporin use.    Social History  Substance Use Topics  . Smoking status: Current Every Day Smoker    Packs/day: 0.25    Years: 30.00    Types: Cigarettes  . Smokeless tobacco: Never Used     Comment: 08/14/11 "quit for 3 wk 05/2011; was smoking 1 ppd since age 52 before I quit; at least I've cut down to 1/4 ppd"  . 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 COPD Mother   . Cancer Mother     lymphoma  . Heart disease Mother   . Hyperlipidemia Father   . Heart disease Father   . Hypertension Maternal Grandmother   . Diabetes Maternal Grandmother   . Cancer Maternal Aunt     breast, colon  . Hypertension Brother       Review of Systems  Constitutional: negative for fatigue and weight loss Respiratory: negative for cough and  wheezing Cardiovascular:  negative for chest pain, fatigue and palpitations Gastrointestinal: negative for abdominal pain and change in bowel habits Musculoskeletal:negative for myalgias Neurological: negative for gait problems and tremors Behavioral/Psych: negative for abusive relationship, depression Endocrine: negative for temperature intolerance   Genitourinary:negative for abnormal menstrual periods, genital lesions, hot flashes, sexual problems and vaginal discharge Integument/breast: negative for breast lump, breast tenderness, nipple discharge and skin lesion(s)    Objective:       BP (!) 146/84   Pulse (!) 105   Ht 5' 7.5" (1.715 m)   Wt 251 lb (113.9 kg)   BMI 38.73 kg/m  General:   alert  Skin:   no rash or abnormalities  Lungs:   clear to auscultation bilaterally  Heart:   regular rate and rhythm, S1, S2 normal, no murmur, click, rub or gallop  Breasts:   normal without suspicious masses, skin or nipple changes or axillary nodes  Abdomen:  normal findings: no organomegaly, soft, non-tender and no hernia  Pelvis:  External genitalia: normal general appearance Urinary system: urethral meatus normal and bladder without fullness, nontender Vaginal: normal without tenderness, induration or masses Cervix: normal appearance Adnexa: normal bimanual exam Uterus: anteverted and non-tender, normal size   Lab Review Urine pregnancy test Labs reviewed yes Radiologic studies reviewed yes  50% of 20 min visit spent on counseling and coordination of care.   Assessment:    Healthy female exam.    Plan:    Education reviewed: calcium supplements, depression evaluation, low fat, low cholesterol diet, safe sex/STD prevention, self breast exams, smoking cessation and weight bearing exercise. Contraception: condoms. Follow up in: 1 year.   Meds ordered this encounter  Medications  . acyclovir (ZOVIRAX) 400 MG tablet    Sig: Take 1 tablet (400 mg total) by mouth 3 (three) times daily.    Dispense:   30 tablet    Refill:  PRN  . cefUROXime (CEFTIN) 500 MG tablet    Sig: Take 1 tablet (500 mg total) by mouth 2 (two) times daily with a meal.    Dispense:  14 tablet    Refill:  2  . fluconazole (DIFLUCAN) 150 MG tablet    Sig: TAKE 1 TABLET BY MOUTH ONCE    Dispense:  1 tablet    Refill:  0   Orders Placed This Encounter  Procedures  . Urine culture  . Specimen status report  . POCT urinalysis dipstick     Patient ID: Cassidy [name], female   DOB: [birth year], 62 y.o.   MRN: [id number]

## 2015-10-21 DIAGNOSIS — A499 Bacterial infection, unspecified: Secondary | ICD-10-CM

## 2015-10-21 DIAGNOSIS — N390 Urinary tract infection, site not specified: Secondary | ICD-10-CM

## 2015-10-21 LAB — GLUCOSE, CAPILLARY
Glucose-Capillary: 225 mg/dL — ABNORMAL HIGH (ref 65–99)
Glucose-Capillary: 352 mg/dL — ABNORMAL HIGH (ref 65–99)

## 2015-10-21 LAB — CBC
HEMATOCRIT: 35.6 % — AB (ref 36.0–46.0)
Hemoglobin: 12 g/dL (ref 12.0–15.0)
MCH: 29.3 pg (ref 26.0–34.0)
MCHC: 33.7 g/dL (ref 30.0–36.0)
MCV: 86.8 fL (ref 78.0–100.0)
Platelets: 306 10*3/uL (ref 150–400)
RBC: 4.1 MIL/uL (ref 3.87–5.11)
RDW: 13.4 % (ref 11.5–15.5)
WBC: 17.5 10*3/uL — ABNORMAL HIGH (ref 4.0–10.5)

## 2015-10-21 LAB — BASIC METABOLIC PANEL
Anion gap: 5 (ref 5–15)
BUN: 21 mg/dL — AB (ref 6–20)
CALCIUM: 8.7 mg/dL — AB (ref 8.9–10.3)
CO2: 25 mmol/L (ref 22–32)
CREATININE: 0.86 mg/dL (ref 0.44–1.00)
Chloride: 107 mmol/L (ref 101–111)
GFR calc Af Amer: >60 mL/min (ref >60)
GFR calc non Af Amer: >60 mL/min (ref >60)
GLUCOSE: 301 mg/dL — AB (ref 65–99)
Potassium: 3.8 mmol/L (ref 3.5–5.1)
Sodium: 137 mmol/L (ref 135–145)

## 2015-10-21 MED ORDER — PREDNISONE 20 MG PO TABS: 20.0000 mg | ORAL_TABLET | Freq: Every day | ORAL | 0 refills | Status: DC

## 2015-10-21 MED ORDER — PREDNISONE 20 MG PO TABS
60.0000 mg | ORAL_TABLET | Freq: Every day | ORAL | Status: DC
  Administered 2015-10-21: 60 mg via ORAL
  Filled 2015-10-21: qty 3

## 2015-10-21 MED ORDER — POLYETHYLENE GLYCOL 3350 17 G PO PACK
17.0000 g | PACK | Freq: Two times a day (BID) | ORAL | Status: DC
  Administered 2015-10-21: 17 g via ORAL

## 2015-10-21 MED ORDER — TIOTROPIUM BROMIDE MONOHYDRATE 18 MCG IN CAPS: 18.0000 ug | ORAL_CAPSULE | Freq: Every day | RESPIRATORY_TRACT | 3 refills | Status: DC

## 2015-10-21 MED ORDER — LORATADINE 10 MG PO TABS: 10.0000 mg | ORAL_TABLET | Freq: Every day | ORAL | 0 refills | Status: DC

## 2015-10-21 MED ORDER — BUDESONIDE-FORMOTEROL FUMARATE 160-4.5 MCG/ACT IN AERO: 2.0000 | INHALATION_SPRAY | Freq: Two times a day (BID) | RESPIRATORY_TRACT | 3 refills | Status: DC

## 2015-10-21 MED ORDER — CEFUROXIME AXETIL 500 MG PO TABS: 500.0000 mg | ORAL_TABLET | Freq: Two times a day (BID) | ORAL | 0 refills | Status: AC

## 2015-10-21 NOTE — Care Management Note (Signed)
Case Management Note  Patient Details  Name: Cassidy [name] MRN: [id number] Date of Birth: [birth year]  Subjective/Objective:      50 yo admitted with COPD exacerbation.              Action/Plan: From home with family. Chart reviewed and CM following for DC needs.   Expected Discharge Date:                  Expected Discharge Plan:  Home/Self Care  In-House Referral:     Discharge planning Services  CM Consult  Post Acute Care Choice:    Choice offered to:     DME Arranged:    DME Agency:     HH Arranged:    HH Agency:     Status of Service:  In process, will continue to follow  If discussed at Long Length of Stay Meetings, dates discussed:    Additional CommentsLynnell Catalan, RN 10/21/2015, 1:17 PM  [phone]

## 2015-10-21 NOTE — Progress Notes (Signed)
Discharge paperwork gone over with patient and signed. Prescriptions also given to patient. All questions answered. Patient wheeled to the front of the hospital and to her family's car via wheelchair by the nurse tech.

## 2015-10-21 NOTE — Discharge Summary (Signed)
Physician Discharge Summary  Cassidy [name] QIH:[id number] DOB: 12-Aug-1965 DOA: 10/18/2015  PCP: Javier Docker, MD  Admit date: 10/18/2015 Discharge date: 10/21/2015  Time spent: 60 minutes  Recommendations for Outpatient Follow-up:  1. Follow-up with Javier Docker, MD as scheduled or in 2 weeks.   Discharge Diagnoses:  Principal Problem:   COPD exacerbation ([place]) Active Problems:   Essential hypertension   CAD S/P percutaneous coronary angioplasty   Type II diabetes mellitus with nephropathy (H[facility])   Hypoxia   Obstructive chronic bronchitis with exacerbation ([place])   Discharge Condition: Stable and improved  Diet recommendation: Carb modified  Filed Weights   10/18/15 0744  Weight: 113.9 kg (251 lb)    History of present illness:  Per Dr Paul Dykes  is a 50 y.o. female, With history of diabetes mellitus, CAD status post DES to LAD, depression, tobacco abuse, recent diagnosis of COPD came to hospital with worsening shortness of breath following up with his doctor symptoms of stuffy nose. Patient was seen in the ED 2 days prior to admission, and was prescribed nebulizer treatment, consider that she did not improve and that and infection "worse. She also has been coughing up white to yellow colored phlegm but bloody streaks, denied chest pain. No nausea vomiting or diarrhea.  In the ED patient became hypoxic with O2 sats 86-87% on room air and requiring oxygen via nasal cannula. Chest x-ray showed no pneumonia  Hospital Course:  #1 acute COPD exacerbation with bronchitis Patient Was admitted with an acute COPD exacerbation with bronchitis. Patient remained afebrile. Chest x-ray which was done was negative for any acute infiltrate. Patient did have a leukocytosis which was fluctuating likely secondary to steroid taper. Patient was placed on IV steroids, Claritin, Flonase, Pulmicort, scheduled nebulizers, PPI. Patient improved clinically on a daily basis and IV  steroids were tapered down to oral prednisone. IV antibiotics was subsequently transitioned to oral antibiotics. Patient be discharged home on a steroid taper as well as 5 more days of oral Ceftin to complete a course of antibiotic treatment. Patient will also be discharged on Spiriva and Symbicort. Outpatient follow-up.   #2 hypertension Continued on home regimen of Cozaar, Coreg.  #3 type 2 diabetes  Hgba1c = 7.7. CBGs Were initially elevated during the hospitalization secondary to IV steroids and patient's Levemir doses adjusted accordingly. Steroids were tapered down CBGs improved. Outpatient follow-up.   #4 coronary artery disease status post PTCA Stable. Patient asymptomatic. Continued on home cardiac regimen of Coreg, Cozaar.    Procedures:  Chest x-ray 10/18/2015  Consultations:  None  Discharge Exam: Vitals:   10/21/15 0427 10/21/15 1409  BP: 138/79 (!) 142/93  Pulse: 77 80  Resp: 18 16  Temp: 98.4 F (36.9 C) 97.7 F (36.5 C)    General: NAD Cardiovascular: RRR Respiratory: CTAB  Discharge Instructions   Discharge Instructions    Diet Carb Modified    Complete by:  As directed    Discharge instructions    Complete by:  As directed    Follow up with Javier Docker, MD as scheduled.   Increase activity slowly    Complete by:  As directed      Current Discharge Medication List    START taking these medications   Details  budesonide-formoterol (SYMBICORT) 160-4.5 MCG/ACT inhaler Inhale 2 puffs into the lungs 2 (two) times daily. Qty: 1 Inhaler, Refills: 3    loratadine (CLARITIN) 10 MG tablet Take 1 tablet (10 mg total) by mouth  daily. Qty: 5 tablet, Refills: 0    predniSONE (DELTASONE) 20 MG tablet Take 1-3 tablets (20-60 mg total) by mouth daily before breakfast. Take 3 tablets ('60mg'$ ) daily x 2 days, then 2 tablets ('40mg'$ ) daily x 3 days, then 1 tablet ('20mg'$ ) x 3 days then stop. Qty: 15 tablet, Refills: 0    tiotropium (SPIRIVA HANDIHALER)  18 MCG inhalation capsule Place 1 capsule (18 mcg total) into inhaler and inhale daily. Qty: 30 capsule, Refills: 3      CONTINUE these medications which have CHANGED   Details  cefUROXime (CEFTIN) 500 MG tablet Take 1 tablet (500 mg total) by mouth 2 (two) times daily with a meal. Qty: 10 tablet, Refills: 0   Associated Diagnoses: UTI (urinary tract infection), bacterial      CONTINUE these medications which have NOT CHANGED   Details  acyclovir (ZOVIRAX) 400 MG tablet Take 1 tablet (400 mg total) by mouth 3 (three) times daily. 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 Qty: 1 Inhaler, Refills: 0    albuterol (PROVENTIL) (2.5 MG/3ML) 0.083% nebulizer solution Take 2.5 mg by nebulization every 6 (six) hours as needed for wheezing or shortness of breath.    aspirin EC 81 MG tablet Take 81 mg by mouth daily.    baclofen (LIORESAL) 10 MG tablet Take 10 mg by mouth 3 (three) times daily. Refills: 0    buPROPion (WELLBUTRIN SR) 150 MG 12 hr tablet Take 150 mg by mouth 2 (two) times daily. Refills: 5    carvedilol (COREG) 6.25 MG tablet Take 1 tablet (6.25 mg total) by mouth 2 (two) times daily with a meal. Qty: 60 tablet, Refills: 11    clindamycin (CLEOCIN T) 1 % lotion Apply 1 application topically 2 (two) times daily. Apply to eczema on shoulders Refills: 2    clopidogrel (PLAVIX) 75 MG tablet Take 1 tablet (75 mg total) by mouth daily. Qty: 30 tablet, Refills: 11    diclofenac sodium (VOLTAREN) 1 % GEL Apply 1 application topically 3 (three) times daily as needed (pain).    fluticasone (FLONASE) 50 MCG/ACT nasal spray Place 1 spray into both nostrils daily as needed (congestion).   Refills: 5    furosemide (LASIX) 20 MG tablet Take 20 mg by mouth daily. Refills: 5    gabapentin (NEURONTIN) 300 MG capsule Take 300 mg by mouth 3 (three) times daily. Refills: 0    guaiFENesin 200 MG tablet Take 1 tablet (200 mg total) by mouth every 4 (four) hours as needed for cough or to loosen phlegm. Qty: 30 tablet, Refills: 0    ibuprofen (ADVIL,MOTRIN) 200 MG tablet Take 400 mg by mouth every 6 (six) hours as needed (pain/ inflammation).    Insulin Detemir (LEVEMIR FLEXTOUCH) 100 UNIT/ML Pen Inject 20 Units into the skin daily at 10 pm. Qty: 15 mL, Refills: 2    losartan (COZAAR) 50 MG tablet Take 50 mg by mouth daily. Refills: 5    lubiprostone (AMITIZA) 24 MCG capsule Take 24 mcg by mouth 2 (two) times daily with a meal.    metFORMIN (GLUCOPHAGE-XR) 500 MG 24 hr tablet Take 2 tablets (1,000 mg total) by mouth daily with supper.  Qty: 180 tablet, Refills: 1    mometasone (ELOCON) 0.1 % cream Apply 1 application topically 2 (two) times daily as needed (wound care (vaginal boils)).  Refills: 2    nitroGLYCERIN (NITROSTAT) 0.4 MG SL tablet Place 1 tablet (0.4 mg total) under the tongue every 5 (five) minutes as needed for chest pain. Qty: 25 tablet, Refills: 5    NOVOLOG FLEXPEN 100 UNIT/ML FlexPen INJECT 10-12 UNITS INTO THE SKIN 3 TIMES DAILY WITH MEALS. PLUS SLIDING SCALE. Qty: 15 mL, Refills: 5    oxyCODONE-acetaminophen (PERCOCET/ROXICET) 5-325 MG tablet Take 1 tablet by mouth 4 (four) times daily.    pantoprazole (PROTONIX) 40 MG tablet Take 40 mg by mouth daily. Refills: 1    simvastatin (ZOCOR) 40 MG tablet TAKE 1 TABLET BY MOUTH ONCE A DAY AT 6:00 PM Qty: 30 tablet, Refills: 10    traZODone (DESYREL) 100 MG tablet Take 50 mg by mouth at bedtime as needed for sleep.     vitamin B-12 (CYANOCOBALAMIN) 1000 MCG tablet Take 1,000 mcg by mouth daily.    ACCU-CHEK AVIVA PLUS test strip TEST BLOOD SUGAR THREE TIMES DAILY AS DIRECTED Qty: 100 each, Refills: 4     Insulin Pen Needle 32G X 4 MM MISC Use to inject insulin 4 times daily. Qty: 130 each, Refills: 5    Insulin Syringe-Needle U-100 (INSULIN SYRINGE 1CC/31GX5/16") 31G X 5/16" 1 ML MISC USE AS DIRECTED FOUR TIMES DAILY FOR INSULIN INJECTIONS. Qty: 200 each, Refills: 0      STOP taking these medications     azithromycin (ZITHROMAX Z-PAK) 250 MG tablet      fluconazole (DIFLUCAN) 150 MG tablet      gabapentin (NEURONTIN) 600 MG tablet      isosorbide mononitrate (IMDUR) 30 MG 24 hr tablet      phenazopyridine (PYRIDIUM) 100 MG tablet        Allergies  Allergen Reactions  . Amoxicillin Hives    Has patient had a PCN reaction causing immediate rash, facial/tongue/throat swelling, SOB or lightheadedness with hypotension: Yes Has patient had a PCN reaction causing severe rash involving mucus membranes or skin necrosis: No Has patient had a PCN reaction that required hospitalization No Has patient had a PCN reaction occurring within the last 10 years: Yes If all of the above answers are "NO", then may proceed with Cephalosporin use.   Follow-up Information    Javier Docker, MD .   Specialty:  Internal Medicine Why:  f/u as scheduled or in 2 weeks. Contact information: [address] [place] [address] [phone]            The results of significant diagnostics from this hospitalization (including imaging, microbiology, ancillary and laboratory) are listed below for reference.    Significant Diagnostic Studies: Dg Chest 2 View  Result Date: 10/18/2015 CLINICAL DATA:  Shortness of breath with chest pain for 3 weeks. Cough and congestion EXAM: CHEST  2 VIEW COMPARISON:  October 13, 2015 FINDINGS: There is no edema or consolidation. The heart size and pulmonary vascularity are normal. No adenopathy. No bone lesions. No pneumothorax. IMPRESSION: No edema or consolidation. Electronically Signed   By: Lowella Grip III M.D.   On: 10/18/2015 09:40   Dg  Chest 2 View  Result Date: 10/13/2015 CLINICAL DATA:  Chest tightness under both breasts from coughing radiating to LEFT back, cough productive of clear sputum with shortness of breath for 2 days, fever, nausea and wheezing since last night, history  hypertension, diabetes mellitus, asthma EXAM: CHEST  2 VIEW COMPARISON:  03/27/2015 FINDINGS: Normal heart size, mediastinal contours, and pulmonary vascularity. Lungs clear. No pleural effusion or pneumothorax. Bones demineralized. IMPRESSION: No acute abnormalities. Electronically Signed   By: Lavonia Dana M.D.   On: 10/13/2015 17:46    Microbiology: Recent Results (from the past 240 hour(s))  Urine culture     Status: Abnormal   Collection Time: 10/14/15  1:04 PM  Result Value Ref Range Status   Urine Culture, Routine Final report (A)  Final   Urine Culture result 1 Escherichia coli (A)  Final    Comment: Greater than 100,000 colony forming units per mL Cefazolin <=4 ug/mL Cefazolin with an MIC <=16 predicts susceptibility to the oral agents cefaclor, cefdinir, cefpodoxime, cefprozil, cefuroxime, cephalexin, and loracarbef when used for therapy of uncomplicated urinary tract infections due to E. coli, Klebsiella pneumoniae, and Proteus mirabilis.    ANTIMICROBIAL SUSCEPTIBILITY Comment  Final    Comment:       ** S = Susceptible; I = Intermediate; R = Resistant **                    P = Positive; N = Negative             MICS are expressed in micrograms per mL    Antibiotic                 RSLT#1    RSLT#2    RSLT#3    RSLT#4 Amoxicillin/Clavulanic Acid    S =4 Ampicillin                     R>=32 Cefepime                       S<=1 Ceftriaxone                    S<=1 Cefuroxime                     S =2 Cephalothin                    S =8 Ciprofloxacin                  R>=4 Ertapenem                      S<=0.5 Gentamicin                     R>=16 Imipenem                       S<=1 Levofloxacin                   R>=8 Nitrofurantoin                  S<=16 Piperacillin                   R>=128 Tetracycline                   R>=16 Tobramycin                     I =8 Trimethoprim/Sulfa             R>=320      Labs: Basic Metabolic Panel:  Recent Labs Lab 10/18/15 0806 10/19/15  2376 10/20/15 0354 10/21/15 0335  NA 137 134* 135 137  K 3.7 4.3 4.1 3.8  CL 105 103 105 107  CO2 '24 22 23 25  '$ GLUCOSE 179* 371* 340* 301*  BUN 19 17 23* 21*  CREATININE 0.77 0.91 0.81 0.86  CALCIUM 8.7* 9.1 9.1 8.7*   Liver Function Tests:  Recent Labs Lab 10/18/15 0806 10/19/15 0428  AST 14* 12*  ALT 15 14  ALKPHOS 91 86  BILITOT 0.6 0.7  PROT 7.4 7.2  ALBUMIN 4.0 4.0   No results for input(s): LIPASE, AMYLASE in the last 168 hours. No results for input(s): AMMONIA in the last 168 hours. CBC:  Recent Labs Lab 10/18/15 0806 10/19/15 0428 10/20/15 0354 10/21/15 0335  WBC 12.2* 11.9* 20.2* 17.5*  NEUTROABS 7.1  --   --   --   HGB 13.4 12.8 12.4 12.0  HCT 38.0 37.7 36.3 35.6*  MCV 83.9 85.7 85.6 86.8  PLT 304 304 302 306   Cardiac Enzymes:  Recent Labs Lab 10/18/15 0806  TROPONINI <0.03   BNP: BNP (last 3 results) No results for input(s): BNP in the last 8760 hours.  ProBNP (last 3 results) No results for input(s): PROBNP in the last 8760 hours.  CBG:  Recent Labs Lab 10/20/15 1237 10/20/15 1656 10/20/15 2140 10/21/15 0739 10/21/15 1128  GLUCAP 269* 366* 249* 225* 352*       Signed:  THOMPSON,DANIEL MD.  [facility] 10/21/2015, 2:56 PM

## 2015-10-30 ENCOUNTER — Other Ambulatory Visit: Payer: Self-pay | Admitting: Internal Medicine

## 2015-10-30 DIAGNOSIS — N390 Urinary tract infection, site not specified: Secondary | ICD-10-CM

## 2015-10-30 DIAGNOSIS — A499 Bacterial infection, unspecified: Secondary | ICD-10-CM

## 2015-11-13 ENCOUNTER — Other Ambulatory Visit: Payer: Self-pay | Admitting: Obstetrics

## 2015-11-13 ENCOUNTER — Telehealth: Payer: Self-pay | Admitting: *Deleted

## 2015-11-13 DIAGNOSIS — B3731 Acute candidiasis of vulva and vagina: Secondary | ICD-10-CM

## 2015-11-13 DIAGNOSIS — B373 Candidiasis of vulva and vagina: Secondary | ICD-10-CM

## 2015-11-13 DIAGNOSIS — E139 Other specified diabetes mellitus without complications: Secondary | ICD-10-CM

## 2015-11-13 MED ORDER — FLUCONAZOLE 200 MG PO TABS: 200.0000 mg | ORAL_TABLET | ORAL | 2 refills | Status: DC

## 2015-11-13 NOTE — Telephone Encounter (Signed)
Patient is calling to request a longer Diflucan treatment. She is a diabetic and she has been in the hospital recently with bronchitis and the prednisone has made her sugars so high that the yeast is out of control. She is requesting an every other day course of diflucan.

## 2015-11-19 ENCOUNTER — Telehealth: Payer: Self-pay | Admitting: *Deleted

## 2015-11-19 NOTE — Progress Notes (Signed)
Pt made aware of Rx sent by provider.

## 2015-11-19 NOTE — Telephone Encounter (Signed)
Pt made aware Rx for Diflucan sent in by Dr Jodi Mourning.

## 2015-12-18 ENCOUNTER — Ambulatory Visit (INDEPENDENT_AMBULATORY_CARE_PROVIDER_SITE_OTHER): Payer: Medicaid Other | Admitting: Gastroenterology

## 2015-12-18 ENCOUNTER — Encounter: Payer: Self-pay | Admitting: Gastroenterology

## 2015-12-18 ENCOUNTER — Telehealth: Payer: Self-pay | Admitting: *Deleted

## 2015-12-18 VITALS — BP 140/74 | HR 120 | Ht 67.0 in | Wt 240.1 lb

## 2015-12-18 DIAGNOSIS — Z1211 Encounter for screening for malignant neoplasm of colon: Secondary | ICD-10-CM | POA: Diagnosis not present

## 2015-12-18 DIAGNOSIS — F17200 Nicotine dependence, unspecified, uncomplicated: Secondary | ICD-10-CM | POA: Diagnosis not present

## 2015-12-18 DIAGNOSIS — K219 Gastro-esophageal reflux disease without esophagitis: Secondary | ICD-10-CM | POA: Diagnosis not present

## 2015-12-18 DIAGNOSIS — K5904 Chronic idiopathic constipation: Secondary | ICD-10-CM

## 2015-12-18 MED ORDER — NA SULFATE-K SULFATE-MG SULF 17.5-3.13-1.6 GM/177ML PO SOLN: 1.0000 | Freq: Once | ORAL | 0 refills | Status: AC

## 2015-12-18 NOTE — Telephone Encounter (Signed)
Okay to hold Plavix for 5-7 days preprocedure. Restart 2-3 days post.  Glenetta Hew, MD

## 2015-12-18 NOTE — Patient Instructions (Signed)
You have been scheduled for a colonoscopy. Please follow written instructions given to you at your visit today.  Please pick up your prep supplies at the pharmacy within the next 1-3 days. If you use inhalers (even only as needed), please bring them with you on the day of your procedure. Your physician has requested that you go to www.[email or web address] and enter the access code given to you at your visit today. This web site gives a general overview about your procedure. However, you should still follow specific instructions given to you by our office regarding your preparation for the procedure.  You will be contacted by our office prior to your procedure for directions on holding your Plavix.  If you do not hear from our office 1 week prior to your scheduled procedure, please call [phone] to discuss.   Follow up as needed

## 2015-12-18 NOTE — Progress Notes (Signed)
Cassidy [name]    [id number]    November 14, 1965  Primary Care Physician:Richard Lois Huxley, MD  Referring Physician: Javier Docker, MD [address] [place], [place] [address]  Chief complaint:  GERD, Screening colonosocpy  HPI: 50 year old African-American female with history of DM, CAD status post cardiac stents on aspirin and Plavix is here to discuss screening colonoscopy. She had cardiac stents placed about 2 years ago after she had evaluation for shortness of breath and noted to have severe CAD. She was recently diagnosed with COPD, currently smokes half pack cigarettes and has been smoking for past 30 years. She did cut back a number of cigarettes and is trying to quit. Her aunt had Crohn's disease and died of colon cancer in her early 8s. No other family member with history of Crohn's. She has chronic constipation opiate induced, that is improved with twice daily and Amitiza. She also has chronic GERD symptoms (heartburn, regurgitation, hoarseness throat) improved with once daily Protonix with rare occasional breakthrough symptoms. Was on omeprazole for past 3 years prior to switching to Protonix. Denies any nausea, vomiting, abdominal pain, melena or bright red blood per rectum    Outpatient Encounter Prescriptions as of 12/18/2015  Medication Sig  . ACCU-CHEK AVIVA PLUS test strip TEST BLOOD SUGAR THREE TIMES DAILY AS DIRECTED  . acyclovir (ZOVIRAX) 400 MG tablet Take 1 tablet (400 mg total) by mouth 3 (three) times daily.  . Albiglutide (TANZEUM) 50 MG PEN Inject 50 mg weekly under skin (Patient taking differently: Inject 50 mg into the skin every Sunday. )  . albuterol (PROVENTIL HFA;VENTOLIN HFA) 108 (90 Base) MCG/ACT inhaler Inhale 1-2 puffs into the lungs every 6 (six) hours as needed for wheezing or shortness of breath.  Marland Kitchen albuterol (PROVENTIL) (2.5 MG/3ML) 0.083% nebulizer solution Take 2.5 mg by nebulization every 6 (six) hours as needed for  wheezing or shortness of breath.  Marland Kitchen aspirin EC 81 MG tablet Take 81 mg by mouth daily.  . baclofen (LIORESAL) 10 MG tablet Take 10 mg by mouth 3 (three) times daily.  . budesonide-formoterol (SYMBICORT) 160-4.5 MCG/ACT inhaler Inhale 2 puffs into the lungs 2 (two) times daily.  Marland Kitchen buPROPion (WELLBUTRIN SR) 150 MG 12 hr tablet Take 150 mg by mouth 2 (two) times daily.  . carvedilol (COREG) 6.25 MG tablet Take 1 tablet (6.25 mg total) by mouth 2 (two) times daily with a meal.  . cefUROXime (CEFTIN) 500 MG tablet daily.  . clindamycin (CLEOCIN T) 1 % lotion Apply 1 application topically 2 (two) times daily. Apply to eczema on shoulders  . clopidogrel (PLAVIX) 75 MG tablet Take 1 tablet (75 mg total) by mouth daily.  . diclofenac sodium (VOLTAREN) 1 % GEL Apply 1 application topically 3 (three) times daily as needed (pain).  . fluconazole (DIFLUCAN) 200 MG tablet Take 1 tablet (200 mg total) by mouth every other day.  . fluticasone (FLONASE) 50 MCG/ACT nasal spray Place 1 spray into both nostrils daily as needed (congestion).   . furosemide (LASIX) 20 MG tablet Take 20 mg by mouth daily.  Marland Kitchen gabapentin (NEURONTIN) 300 MG capsule Take 300 mg by mouth 3 (three) times daily.  Marland Kitchen guaiFENesin 200 MG tablet Take 1 tablet (200 mg total) by mouth every 4 (four) hours as needed for cough or to loosen phlegm.  Marland Kitchen ibuprofen (ADVIL,MOTRIN) 200 MG tablet Take 400 mg by mouth every 6 (six) hours as needed (pain/ inflammation).  Marland Kitchen  Insulin Detemir (LEVEMIR FLEXTOUCH) 100 UNIT/ML Pen Inject 20 Units into the skin daily at 10 pm.  . Insulin Pen Needle 32G X 4 MM MISC Use to inject insulin 4 times daily.  . Insulin Syringe-Needle U-100 (INSULIN SYRINGE 1CC/31GX5/16") 31G X 5/16" 1 ML MISC USE AS DIRECTED FOUR TIMES DAILY FOR INSULIN INJECTIONS.  Marland Kitchen loratadine (CLARITIN) 10 MG tablet Take 1 tablet (10 mg total) by mouth daily.  Marland Kitchen losartan (COZAAR) 50 MG tablet Take 50 mg by mouth daily.  Marland Kitchen lubiprostone (AMITIZA) 24 MCG  capsule Take 24 mcg by mouth 2 (two) times daily with a meal.  . metFORMIN (GLUCOPHAGE-XR) 500 MG 24 hr tablet Take 2 tablets (1,000 mg total) by mouth daily with supper.  . mometasone (ELOCON) 0.1 % cream Apply 1 application topically 2 (two) times daily as needed (wound care (vaginal boils)).   . nitroGLYCERIN (NITROSTAT) 0.4 MG SL tablet Place 1 tablet (0.4 mg total) under the tongue every 5 (five) minutes as needed for chest pain.  Marland Kitchen NOVOLOG FLEXPEN 100 UNIT/ML FlexPen INJECT 10-12 UNITS INTO THE SKIN 3 TIMES DAILY WITH MEALS. PLUS SLIDING SCALE. (Patient taking differently: INJECT 12 -15 SUBCUTANEOUSLY BEFORE BREAKFAST AND LUNCH BASED ON CBG)  . oxyCODONE-acetaminophen (PERCOCET) 10-325 MG tablet Take 1 tablet by mouth every 6 (six) hours as needed. for pain  . oxyCODONE-acetaminophen (PERCOCET/ROXICET) 5-325 MG tablet Take 1 tablet by mouth 4 (four) times daily.  . pantoprazole (PROTONIX) 40 MG tablet Take 40 mg by mouth daily.  . predniSONE (DELTASONE) 20 MG tablet Take 1-3 tablets (20-60 mg total) by mouth daily before breakfast. Take 3 tablets ('60mg'$ ) daily x 2 days, then 2 tablets ('40mg'$ ) daily x 3 days, then 1 tablet ('20mg'$ ) x 3 days then stop.  . simvastatin (ZOCOR) 40 MG tablet TAKE 1 TABLET BY MOUTH ONCE A DAY AT 6:00 PM  . tiotropium (SPIRIVA HANDIHALER) 18 MCG inhalation capsule Place 1 capsule (18 mcg total) into inhaler and inhale daily.  . traZODone (DESYREL) 100 MG tablet Take 50 mg by mouth at bedtime as needed for sleep.   . vitamin B-12 (CYANOCOBALAMIN) 1000 MCG tablet Take 1,000 mcg by mouth daily.   No facility-administered encounter medications on file as of 12/18/2015.     Allergies as of 12/18/2015 - Review Complete 12/18/2015  Allergen Reaction Noted  . Amoxicillin Hives 02/08/2014    Past Medical History:  Diagnosis Date  . Anemia   . Atherosclerotic heart disease of native coronary artery with angina pectoris ([place]) 05/2012, 10/2013   a.  s/p PCTA to dRCA and ostial  RPAV-PLA vessel + PDA branch (05/2012)  b. [place] s/p DES to mLAD - Resolute DES 3.0 x 22 (3.35m -->3.3 mm)  . Bipolar depression ([facility]   . Breast abscess    a. right side.   .Marland KitchenCOPD (chronic obstructive pulmonary disease) ([facility]   . Depression   . Diabetic peripheral neuropathy ([facility]   . Fibromyalgia   . Genital warts   . GERD (gastroesophageal reflux disease)   . HLD (hyperlipidemia)   . Hypertension   . Pain in limb    a. LE VENOUS DUPLEX, 02/05/2009 - no evidence of deep vein thrombosis, Baker's cyst  . PTSD (post-traumatic stress disorder)   . Sickle cell trait ([facility]   . Tobacco abuse   . Tooth caries   . Type II diabetes mellitus ([facility]     Past Surgical History:  Procedure Laterality Date  . BLADDER SURGERY  1980   "TVT"  .  BREAST SURGERY Right    I&D for multiple abscesses  . cutting balloon    . EYE SURGERY Left    laser surgery  . INCISE AND DRAIN ABCESS  ; 04/26/11; 08/13/11   right breast  . INCISION AND DRAINAGE ABSCESS Right 05/09/2012   Procedure: INCISION AND DRAINAGE RIGHT BREAST ABSCESS;  Surgeon: Imogene Burn. Georgette Dover, MD;  Location: [facility];  Service: General;  Laterality: Right;  . INCISION AND DRAINAGE ABSCESS Right 02/08/2014   Procedure: INCISION AND DRAINAGE RIGHT BREAST ABSCESS;  Surgeon: Donnie Mesa, MD;  Location: [facility];  Service: General;  Laterality: Right;  . INCISION AND DRAINAGE ABSCESS Right 06/14/2014   Procedure: INCISION AND DRAINAGE RIGHT BREAST ABSCESS;  Surgeon: Donnie Mesa, MD;  Location: [facility];  Service: General;  Laterality: Right;  . IRRIGATION AND DEBRIDEMENT ABSCESS  12/19/2010   Procedure: IRRIGATION AND DEBRIDEMENT ABSCESS;  Surgeon: Rolm Bookbinder, MD;  Location: [facility];  Service: General;  Laterality: Right;  . IRRIGATION AND DEBRIDEMENT ABSCESS  08/13/2011   Procedure: MINOR INCISION AND DRAINAGE OF ABSCESS;  Surgeon: Gwenyth Ober, MD;  Location: [facility];  Service: General;  Laterality: Right;  Right Breast   . IRRIGATION AND DEBRIDEMENT  ABSCESS  11/17/2011   Procedure: IRRIGATION AND DEBRIDEMENT ABSCESS;  Surgeon: Imogene Burn. Georgette Dover, MD;  Location: [facility];  Service: General;  Laterality: Right;  irrigation and debridement right recurrent breast abscess  . LARYNX SURGERY    . LEFT HEART CATHETERIZATION WITH CORONARY ANGIOGRAM N/A 05/29/2012   Procedure: LEFT HEART CATHETERIZATION WITH CORONARY ANGIOGRAM & PTCA;  Surgeon: Troy Sine, MD;  Location[facility] C[facility] LAB;  Service: Cardiovascular; Bifurcation dRCA-RPAD/PLA & rPDA  PTCA.  Marland Kitchen LEFT HEART CATHETERIZATION WITH CORONARY ANGIOGRAM N/A 11/08/2013   Procedure: LEFT HEART CATHETERIZATION WITH CORONARY ANGIOGRAM and Coronary Stent Intervention;  Surgeon: Leonie Man, MD;  Location: [facility] CATH LAB;  Service: Cardiovascular; mLAD 80% (FFR 0.73) - resolute DES 3.0 x 22 mm (postdilated to 3.3 mm->3.5 mm)  . NM MYOVIEW LTD  01/26/2009   Normal study, no evidence of ischemia, EF 67%  . ployp removed from voice box 03/30/12    . REFRACTIVE SURGERY  ~ 2010   right  . TONSILLECTOMY  1988    Family History  Problem Relation Age of Onset  . COPD Mother   . Cancer Mother     lymphoma  . Heart disease Mother   . Stomach cancer Mother   . Hyperlipidemia Father   . Heart disease Father   . Hypertension Maternal Grandmother   . Diabetes Maternal Grandmother   . Cancer Maternal Aunt     breast, colon  . Crohn's disease Maternal Aunt   . Hypertension Brother   . Esophageal cancer Neg Hx   . Rectal cancer Neg Hx   . Liver cancer Neg Hx     Social History   Social History  . Marital status: Single    Spouse name: N/A  . Number of children: 1  . Years of education: N/A   Occupational History  . homemaker    Social History Main Topics  . Smoking status: Current Every Day Smoker    Packs/day: 0.25    Years: 30.00    Types: Cigarettes  . Smokeless tobacco: Never Used     Comment: 08/14/11 "quit for 3 wk 05/2011; was smoking 1 ppd since age 10 before I quit; at least I've cut down to  1/4 ppd"  . 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 Drug use: No     Comment: 08/14/11 'quit everything in 2005"  . Sexual activity: Yes    Partners: Male    Birth control/ protection: Condom   Other Topics Concern  . Not on file   Social History Narrative   Work: disability   Children: son, 7 yrs old   Regular exercise: some/ heart attack in May/ walks 1 mile 3 days a week   Caffeine use: daily, cup of coffee daily      Review of systems: Review of Systems  Constitutional: Negative for fever and chills.  HENT: Negative.   Eyes: Negative for blurred vision.  Respiratory: Positive for cough, shortness of breath and wheezing.   Cardiovascular: Negative for chest pain and palpitations.  Gastrointestinal: as per HPI Genitourinary: Negative for dysuria, urgency, frequency and hematuria.  Musculoskeletal: Positive for myalgias, back pain and joint pain.  Skin: Negative for itching and rash.  Neurological: Negative for dizziness, tremors, focal weakness, seizures and loss of consciousness.  Endo/Heme/Allergies: Positive for seasonal allergies.  Psychiatric/Behavioral: Negative for depression, suicidal ideas and hallucinations.  All other systems reviewed and are negative.   Physical Exam: Vitals:   12/18/15 0854  BP: 140/74  Pulse: (!) 120   Body mass index is 37.61 kg/m. Gen:      No acute distress HEENT:  EOMI, sclera anicteric Neck:     No masses; no thyromegaly Lungs:    Clear to auscultation bilaterally; normal respiratory effort CV:         Regular rate and rhythm; no murmurs Abd:      + bowel sounds; soft, non-tender; no palpable masses, no distension Ext:    No edema; adequate peripheral perfusion Skin:      Warm and dry; no rash Neuro: alert and oriented x 3 Psych: normal mood and affect  Data Reviewed:  Reviewed labs, radiology imaging, old records and pertinent past GI work up   Assessment  and Plan/Recommendations:  50 year old female with history of diabetes complicated by peripheral neuropathy, CAD s/p coronary stents and aspirin and Plavix here to discuss screening colonoscopy The risks and benefits as well as alternatives of endoscopic procedure(s) have been discussed and reviewed. All questions answered. The patient agrees to proceed. We'll discuss with cardiology regarding holding Plavix 5 days prior to the procedure Patient will need 2 days bowel prep given her history of chronic constipation and also diabetes Discussed smoking cessation Chronic GERD: Continue Protonix daily and antireflux measures Chronic opiate induced constipation: Continue Amitiza, increase dietary fluid and fiber intake Return as needed after colonoscopy   Greater than 50% of the time used for counseling as well as treatment plan and follow-up. She had multiple questions which were answered to her satisfaction  K. Denzil Magnuson , MD [phone] Mon-Fri 8a-5p [phone] after 5p, weekends, holidays  CC: P[place], Ralene Bathe, MD

## 2015-12-18 NOTE — Telephone Encounter (Signed)
  12/18/2015   RE: Cassidy [name] DOB: [birth year] MRN: [id number]   Dear  Dr Ellyn Hack  We have scheduled the above patient for an endoscopic procedure. Our records show that she is on anticoagulation therapy.   Please advise as to how long the patient may come off her therapy of plavix prior to the procedure, which is scheduled for 01/27/2016.  Please fax back/ or route the completed form to [facility] at [phone].   Sincerely,    Genella Mech ,CMA AAMA

## 2015-12-19 NOTE — Telephone Encounter (Signed)
Dr Silverio Decamp please advise on how many days to hold 5 or 7 days  Thank you

## 2015-12-19 NOTE — Telephone Encounter (Signed)
Please advise patient to hold Plavix 5 days before the procedure

## 2015-12-26 ENCOUNTER — Other Ambulatory Visit: Payer: Self-pay | Admitting: Internal Medicine

## 2015-12-29 ENCOUNTER — Encounter (HOSPITAL_COMMUNITY): Payer: Self-pay | Admitting: Emergency Medicine

## 2015-12-29 ENCOUNTER — Emergency Department (HOSPITAL_COMMUNITY): Payer: Medicaid Other

## 2015-12-29 ENCOUNTER — Emergency Department (HOSPITAL_COMMUNITY)
Admission: EM | Admit: 2015-12-29 | Discharge: 2015-12-29 | Disposition: A | Discharge status: Home / Self Care | Payer: Medicaid Other | Attending: Emergency Medicine | Admitting: Emergency Medicine

## 2015-12-29 DIAGNOSIS — R0602 Shortness of breath: Secondary | ICD-10-CM

## 2015-12-29 DIAGNOSIS — Z7982 Long term (current) use of aspirin: Secondary | ICD-10-CM | POA: Insufficient documentation

## 2015-12-29 DIAGNOSIS — J449 Chronic obstructive pulmonary disease, unspecified: Secondary | ICD-10-CM | POA: Insufficient documentation

## 2015-12-29 DIAGNOSIS — E1140 Type 2 diabetes mellitus with diabetic neuropathy, unspecified: Secondary | ICD-10-CM | POA: Insufficient documentation

## 2015-12-29 DIAGNOSIS — I10 Essential (primary) hypertension: Secondary | ICD-10-CM | POA: Diagnosis not present

## 2015-12-29 DIAGNOSIS — Z794 Long term (current) use of insulin: Secondary | ICD-10-CM | POA: Diagnosis not present

## 2015-12-29 DIAGNOSIS — F17210 Nicotine dependence, cigarettes, uncomplicated: Secondary | ICD-10-CM | POA: Diagnosis not present

## 2015-12-29 DIAGNOSIS — I2510 Atherosclerotic heart disease of native coronary artery without angina pectoris: Secondary | ICD-10-CM | POA: Diagnosis not present

## 2015-12-29 LAB — BASIC METABOLIC PANEL
Anion gap: 9 (ref 5–15)
BUN: 15 mg/dL (ref 6–20)
CHLORIDE: 104 mmol/L (ref 101–111)
CO2: 27 mmol/L (ref 22–32)
Calcium: 9.5 mg/dL (ref 8.9–10.3)
Creatinine, Ser: 0.86 mg/dL (ref 0.44–1.00)
GFR calc non Af Amer: >60 mL/min (ref >60)
Glucose, Bld: 135 mg/dL — ABNORMAL HIGH (ref 65–99)
POTASSIUM: 3.5 mmol/L (ref 3.5–5.1)
SODIUM: 140 mmol/L (ref 135–145)

## 2015-12-29 LAB — CBC
HCT: 40.2 % (ref 36.0–46.0)
HEMOGLOBIN: 13.9 g/dL (ref 12.0–15.0)
MCH: 28.8 pg (ref 26.0–34.0)
MCHC: 34.6 g/dL (ref 30.0–36.0)
MCV: 83.2 fL (ref 78.0–100.0)
PLATELETS: 341 10*3/uL (ref 150–400)
RBC: 4.83 MIL/uL (ref 3.87–5.11)
RDW: 13.5 % (ref 11.5–15.5)
WBC: 10.3 10*3/uL (ref 4.0–10.5)

## 2015-12-29 MED ORDER — BENZONATATE 100 MG PO CAPS: 100.0000 mg | ORAL_CAPSULE | Freq: Three times a day (TID) | ORAL | 0 refills | Status: DC

## 2015-12-29 MED ORDER — ALBUTEROL SULFATE (2.5 MG/3ML) 0.083% IN NEBU
5.0000 mg | INHALATION_SOLUTION | Freq: Once | RESPIRATORY_TRACT | Status: AC
  Administered 2015-12-29: 5 mg via RESPIRATORY_TRACT
  Filled 2015-12-29: qty 6

## 2015-12-29 MED ORDER — ALBUTEROL (5 MG/ML) CONTINUOUS INHALATION SOLN
10.0000 mg/h | INHALATION_SOLUTION | RESPIRATORY_TRACT | Status: DC
  Administered 2015-12-29: 10 mg/h via RESPIRATORY_TRACT
  Filled 2015-12-29: qty 20

## 2015-12-29 MED ORDER — METHYLPREDNISOLONE SODIUM SUCC 125 MG IJ SOLR
125.0000 mg | Freq: Once | INTRAMUSCULAR | Status: AC
  Administered 2015-12-29: 125 mg via INTRAVENOUS
  Filled 2015-12-29: qty 2

## 2015-12-29 MED ORDER — IPRATROPIUM BROMIDE 0.02 % IN SOLN
0.5000 mg | Freq: Once | RESPIRATORY_TRACT | Status: AC
  Administered 2015-12-29: 0.5 mg via RESPIRATORY_TRACT
  Filled 2015-12-29: qty 2.5

## 2015-12-29 MED ORDER — PREDNISONE 20 MG PO TABS: 40.0000 mg | ORAL_TABLET | Freq: Every day | ORAL | 0 refills | Status: AC

## 2015-12-29 NOTE — ED Provider Notes (Signed)
[facility] DEPT Provider Note   CSN: [id number] Arrival date & time: 12/29/15  1444     History   Chief Complaint Chief Complaint  Patient presents with  . Shortness of Breath    HPI Cassidy [name] is a 50 y.o. female.  Cassidy [name] is a 51 y.o. female with h/o COPD, HLD, HTN, CAD, anemia, depression, T2DM with peripheral neuropathy, tobacco abuse, GERD, and fibromyalgia presents to ED with complaint of SOB. Patient reports she was recently diagnosed with COPD approximately one month ago when she was admitted for a COPD exacerbation s/p a URI. Patient is not on home O2. Patient reports today with complaint of SOB and productive cough. Patient states she is recently recovering from URI like sxs (nasal congestion, rhinorrhea, watery eye discharge) approximately 2 weeks ago with has since resolved. She has a productive cough with thick yellow sputum and shortness of breath that has persisted. She complains of associated chest pain with coughing only, wheezing, dizziness, headache, post-tussive emesis, and myalgias however chronic. She denies fever, trouble swallowing, visual changes, leg swelling/pain, abdominal pain, dysuria, hematuria, rash, numbness, weakness, facial droop, slurred speech, or LOC.  She has tried her nebulizer at home as well as her inhalers with minimal relief of symptoms prompting her visit to the ED. She continues to smoke; however, states she has cut back and is only smoking approximately 8 cigarettes a day. No recent long distance travel/surgery/hospitalization, h/o blood clots, h/o cancer, hemoptysis, hormone therapy, or lower leg swelling/pain.       Past Medical History:  Diagnosis Date  . Anemia   . Atherosclerotic heart disease of native coronary artery with angina pectoris ([place]) 05/2012, 10/2013   a.  s/p PCTA to dRCA and ostial RPAV-PLA vessel + PDA branch (05/2012)  b. [place] s/p DES to mLAD - Resolute DES 3.0 x 22 (3.74m -->3.3 mm)  . Bipolar depression  ([facility]   . Breast abscess    a. right side.   .Marland KitchenCOPD (chronic obstructive pulmonary disease) ([facility]   . Depression   . Diabetic peripheral neuropathy ([facility]   . Fibromyalgia   . Genital warts   . GERD (gastroesophageal reflux disease)   . HLD (hyperlipidemia)   . Hypertension   . Pain in limb    a. LE VENOUS DUPLEX, 02/05/2009 - no evidence of deep vein thrombosis, Baker's cyst  . PTSD (post-traumatic stress disorder)   . Sickle cell trait ([facility]   . Tobacco abuse   . Tooth caries   . Type II diabetes mellitus ([facility]     Patient Active Problem List   Diagnosis Date Noted  . UTI (urinary tract infection), bacterial   . Obstructive chronic bronchitis with exacerbation ([facility]   . Hypoxia 10/18/2015  . COPD exacerbation ([facility] 10/18/2015  . Hyperlipidemia with target LDL less than 70   . Type II diabetes mellitus with nephropathy ([facility]   . Fibromyalgia   . Diabetic peripheral neuropathy ([facility]   . Bipolar depression ([facility]   . PTSD (post-traumatic stress disorder)   . CAD S/P percutaneous coronary angioplasty 11/02/2013  . Leiomyoma of uterus, unspecified 07/20/2012  . BV (bacterial vaginosis) 07/20/2012  . Breast pain, right 07/17/2012  . Essential hypertension 05/26/2012  . Tobacco abuse 05/26/2012  . Unstable angina pectoris ([facility] 05/26/2012  . Family history of early CAD 05/26/2012  . Perirectal abscess 03/14/2012  . Abscess of right breast 12/18/2010  . CARPAL TUNNEL SYNDROME, LEFT 12/18/2009  . BREAST MASS, RIGHT 08/13/2008  .  INGROWN HAIR 05/03/2008  . TINEA VERSICOLOR 04/06/2007  . ABDOMINAL/PELVIC SWELLING MASS/LUMP EPIGASTRIC 04/06/2007  . BIPOLAR I D/O MOST RECENT EPIS DEPRESSED MOD 11/24/2006  . HERPES, GENITAL NEC 09/30/2006  . SUBSTANCE ABUSE 09/30/2006  . GERD 09/30/2006  . PLANTAR FASCIITIS 09/30/2006    Past Surgical History:  Procedure Laterality Date  . BLADDER SURGERY  1980   "TVT"  . BREAST SURGERY Right    I&D for multiple abscesses  . cutting  balloon    . EYE SURGERY Left    laser surgery  . INCISE AND DRAIN ABCESS  ; 04/26/11; 08/13/11   right breast  . INCISION AND DRAINAGE ABSCESS Right 05/09/2012   Procedure: INCISION AND DRAINAGE RIGHT BREAST ABSCESS;  Surgeon: Imogene Burn. Georgette Dover, MD;  Location: [facility];  Service: General;  Laterality: Right;  . INCISION AND DRAINAGE ABSCESS Right 02/08/2014   Procedure: INCISION AND DRAINAGE RIGHT BREAST ABSCESS;  Surgeon: Donnie Mesa, MD;  Location: [facility];  Service: General;  Laterality: Right;  . INCISION AND DRAINAGE ABSCESS Right 06/14/2014   Procedure: INCISION AND DRAINAGE RIGHT BREAST ABSCESS;  Surgeon: Donnie Mesa, MD;  Location: [facility];  Service: General;  Laterality: Right;  . IRRIGATION AND DEBRIDEMENT ABSCESS  12/19/2010   Procedure: IRRIGATION AND DEBRIDEMENT ABSCESS;  Surgeon: Rolm Bookbinder, MD;  Location: [facility];  Service: General;  Laterality: Right;  . IRRIGATION AND DEBRIDEMENT ABSCESS  08/13/2011   Procedure: MINOR INCISION AND DRAINAGE OF ABSCESS;  Surgeon: Gwenyth Ober, MD;  Location: [facility];  Service: General;  Laterality: Right;  Right Breast   . IRRIGATION AND DEBRIDEMENT ABSCESS  11/17/2011   Procedure: IRRIGATION AND DEBRIDEMENT ABSCESS;  Surgeon: Imogene Burn. Georgette Dover, MD;  Location: [facility];  Service: General;  Laterality: Right;  irrigation and debridement right recurrent breast abscess  . LARYNX SURGERY    . LEFT HEART CATHETERIZATION WITH CORONARY ANGIOGRAM N/A 05/29/2012   Procedure: LEFT HEART CATHETERIZATION WITH CORONARY ANGIOGRAM & PTCA;  Surgeon: Troy Sine, MD;  Location: [facility] CATH LAB;  Service: Cardiovascular; Bifurcation dRCA-RPAD/PLA & rPDA  PTCA.  Marland Kitchen LEFT HEART CATHETERIZATION WITH CORONARY ANGIOGRAM N/A 11/08/2013   Procedure: LEFT HEART CATHETERIZATION WITH CORONARY ANGIOGRAM and Coronary Stent Intervention;  Surgeon: Leonie Man, MD;  Location[facility] C[facility] LAB;  Service: Cardiovascular; mLAD 80% (FFR 0.73) - resolute DES 3.0 x 22 mm (postdilated to 3.3 mm->3.5 mm)   . NM MYOVIEW LTD  01/26/2009   Normal study, no evidence of ischemia, EF 67%  . ployp removed from voice box 03/30/12    . REFRACTIVE SURGERY  ~ 2010   right  . TONSILLECTOMY  1988    OB History    Gravida Para Term Preterm AB Living   '1 1 1     1   '$ SAB TAB Ectopic Multiple Live Births                   Home Medications    Prior to Admission medications   Medication Sig Start Date End Date Taking? Authorizing Provider  acyclovir (ZOVIRAX) 400 MG tablet Take 1 tablet (400 mg total) by mouth 3 (three) times daily. 10/14/15  Yes Shelly Bombard, MD  Albiglutide (TANZEUM) 50 MG PEN Inject 50 mg weekly under skin Patient taking differently: Inject 50 mg into the skin every Sunday.  08/12/15  Yes Philemon Kingdom, MD  albuterol (PROVENTIL HFA;VENTOLIN HFA) 108 (90 Base) MCG/ACT inhaler Inhale 1-2 puffs into the lungs every 6 (six) hours as  needed for wheezing or shortness of breath. 03/27/15  Yes Alexandra M Law, PA-C  albuterol (PROVENTIL) (2.5 MG/3ML) 0.083% nebulizer solution Take 2.5 mg by nebulization every 6 (six) hours as needed for wheezing or shortness of breath.   Yes Historical Provider, MD  aspirin EC 81 MG tablet Take 81 mg by mouth daily.   Yes Historical Provider, MD  baclofen (LIORESAL) 10 MG tablet Take 10 mg by mouth 3 (three) times daily. 10/08/15  Yes Historical Provider, MD  budesonide-formoterol (SYMBICORT) 160-4.5 MCG/ACT inhaler Inhale 2 puffs into the lungs 2 (two) times daily. 10/21/15  Yes Eugenie Filler, MD  buPROPion [facility] SR) 150 MG 12 hr tablet Take 150 mg by mouth 2 (two) times daily. 09/25/15  Yes Historical Provider, MD  carvedilol (COREG) 6.25 MG tablet Take 1 tablet (6.25 mg total) by mouth 2 (two) times daily with a meal. 04/15/15  Yes Leonie Man, MD  clindamycin (CLEOCIN T) 1 % lotion Apply 1 application topically 2 (two) times daily. Apply to eczema on shoulders 08/12/15  Yes Historical Provider, MD  clopidogrel (PLAVIX) 75 MG tablet Take 1 tablet  (75 mg total) by mouth daily. 04/15/15  Yes Leonie Man, MD  diclofenac sodium (VOLTAREN) 1 % GEL Apply 1 application topically 3 (three) times daily as needed (pain).   Yes Historical Provider, MD  fluticasone (FLONASE) 50 MCG/ACT nasal spray Place 1 spray into both nostrils daily as needed (congestion).  12/11/13  Yes Historical Provider, MD  furosemide (LASIX) 20 MG tablet Take 20 mg by mouth daily. 04/10/15  Yes Historical Provider, MD  gabapentin (NEURONTIN) 300 MG capsule Take 300 mg by mouth 3 (three) times daily. 10/08/15  Yes Historical Provider, MD  Insulin Detemir (LEVEMIR FLEXTOUCH) 100 UNIT/ML Pen Inject 20 Units into the skin daily at 10 pm. 03/20/15  Yes Philemon Kingdom, MD  ipratropium-albuterol (DUONEB) 0.5-2.5 (3) MG/3ML SOLN Take 3 mLs by nebulization 2 (two) times daily. 10/16/15  Yes Historical Provider, MD  losartan (COZAAR) 50 MG tablet Take 50 mg by mouth daily. 09/09/15  Yes Historical Provider, MD  lubiprostone (AMITIZA) 24 MCG capsule Take 24 mcg by mouth 2 (two) times daily with a meal.   Yes Historical Provider, MD  metFORMIN (GLUCOPHAGE-XR) 500 MG 24 hr tablet Take 2 tablets (1,000 mg total) by mouth daily with supper. Patient taking differently: Take 1,000 mg by mouth daily with breakfast.  08/12/15  Yes Philemon Kingdom, MD  mometasone (ELOCON) 0.1 % cream Apply 1 application topically 2 (two) times daily as needed (wound care (vaginal boils)).  09/09/15  Yes Historical Provider, MD  nitroGLYCERIN (NITROSTAT) 0.4 MG SL tablet Place 1 tablet (0.4 mg total) under the tongue every 5 (five) minutes as needed for chest pain. 04/15/15  Yes Leonie Man, MD  NOVOLOG FLEXPEN 100 UNIT/ML FlexPen INJECT 10-12 UNITS INTO THE SKIN 3 TIMES DAILY WITH MEALS. PLUS SLIDING SCALE. Patient taking differently: INJECT 12 -15 SUBCUTANEOUSLY BEFORE BREAKFAST AND LUNCH BASED ON CBG 07/09/15  Yes Philemon Kingdom, MD  oxyCODONE-acetaminophen (PERCOCET) 10-325 MG tablet Take 1 tablet by mouth every 4  (four) hours as needed for pain.   Yes Historical Provider, MD  pantoprazole (PROTONIX) 40 MG tablet Take 40 mg by mouth daily. 09/05/15  Yes Historical Provider, MD  simvastatin (ZOCOR) 40 MG tablet TAKE 1 TABLET BY MOUTH ONCE A DAY AT 6:00 PM 06/11/15  Yes Leonie Man, MD  tiotropium (SPIRIVA HANDIHALER) 18 MCG inhalation capsule Place 1 capsule (18 mcg  total) into inhaler and inhale daily. 10/21/15  Yes Eugenie Filler, MD  traZODone (DESYREL) 100 MG tablet Take 50 mg by mouth at bedtime as needed for sleep.    Yes Historical Provider, MD  vitamin B-12 (CYANOCOBALAMIN) 1000 MCG tablet Take 1,000 mcg by mouth daily.   Yes Historical Provider, MD  ACCU-CHEK AVIVA PLUS test strip TEST BLOOD SUGAR THREE TIMES DAILY AS DIRECTED 10/31/15   Philemon Kingdom, MD  benzonatate (TESSALON) 100 MG capsule Take 1 capsule (100 mg total) by mouth every 8 (eight) hours. 12/29/15   Roxanna Mew, PA-C  fluconazole (DIFLUCAN) 200 MG tablet Take 1 tablet (200 mg total) by mouth every other day. Patient not taking: Reported on 12/29/2015 11/13/15   Shelly Bombard, MD  Insulin Pen Needle 32G X 4 MM MISC Use to inject insulin 4 times daily. 03/20/15   Philemon Kingdom, MD  Insulin Syringe-Needle U-100 (INSULIN SYRINGE 1CC/31GX5/16") 31G X 5/16" 1 ML MISC USE AS DIRECTED FOUR TIMES DAILY FOR INSULIN INJECTIONS. 04/28/15   Philemon Kingdom, MD  predniSONE (DELTASONE) 20 MG tablet Take 2 tablets (40 mg total) by mouth daily. 12/29/15 01/02/16  Roxanna Mew, PA-C    Family History Family History  Problem Relation Age of Onset  . COPD Mother   . Cancer Mother     lymphoma  . Heart disease Mother   . Stomach cancer Mother   . Hyperlipidemia Father   . Heart disease Father   . Hypertension Maternal Grandmother   . Diabetes Maternal Grandmother   . Cancer Maternal Aunt     breast, colon  . Crohn's disease Maternal Aunt   . Hypertension Brother   . Esophageal cancer Neg Hx   . Rectal cancer Neg Hx    . Liver cancer Neg Hx     Social History Social History  Substance Use Topics  . Smoking status: Current Every Day Smoker    Packs/day: 0.25    Years: 30.00    Types: Cigarettes  . Smokeless tobacco: Never Used     Comment: 08/14/11 "quit for 3 wk 05/2011; was smoking 1 ppd since age 24 before I quit; at least I've cut down to 1/4 ppd"  . 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  HENT: Negative for trouble swallowing.   Eyes: Negative for visual disturbance.  Respiratory: Positive for cough, shortness of breath and wheezing.   Cardiovascular: Negative for chest pain and leg swelling.  Gastrointestinal: Positive for vomiting ( post-tussive). Negative for abdominal pain, blood in stool, constipation, diarrhea and nausea.  Genitourinary: Negative for dysuria and hematuria.  Musculoskeletal: Positive for myalgias ( chronic in nature,h/o fibromyalgia). Negative for arthralgias.  Skin: Negative for rash.  Neurological: Positive for dizziness and headaches. Negative for syncope.     Physical Exam Updated Vital Signs BP 157/92 (BP Location: Left Arm)   Pulse 120   Temp 98 F (36.7 C) (Oral)   Resp 22   Ht '5\' 7"'$  (1.702 m)   Wt 108.4 kg   SpO2 94%   BMI 37.43 kg/m   Physical Exam  Constitutional: She appears well-developed and well-nourished. No distress.  HENT:  Head: Normocephalic and atraumatic.  Mouth/Throat: Oropharynx is clear and moist. No oropharyngeal exudate.  Eyes: Conjunctivae and EOM are normal. Pupils are equal, round,  and reactive to light. Right eye exhibits no discharge. Left eye exhibits no discharge. No scleral icterus.  Neck: Normal range of motion and phonation normal. Neck supple. No neck rigidity. Normal range of motion present.  Cardiovascular: Regular rhythm, normal  heart sounds and intact distal pulses.  Tachycardia present.   No murmur heard. Pulmonary/Chest: Effort normal. No stridor. No respiratory distress. She has wheezes. She has no rales. She exhibits tenderness.    Reproducible chest wall tenderness to palpation.   Abdominal: Soft. Bowel sounds are normal. She exhibits no distension. There is no tenderness. There is no rigidity, no rebound, no guarding and no CVA tenderness.  Musculoskeletal: Normal range of motion.  No lower extremity swelling. No posterior calf tenderness.   Lymphadenopathy:    She has no cervical adenopathy.  Neurological: She is alert. She is not disoriented. Coordination and gait normal. GCS eye subscore is 4. GCS verbal subscore is 5. GCS motor subscore is 6.  Mental Status: Alert, thought content appropriate, able to give a coherent history. Speech fluent without evidence of aphasia.  CN 2-12 grossly intact.  Moves extremities with ease. Sensation grossly equal and intact throughout. Strength 5/5 in all extremities.  Coordination nml with finger-to-nose b/l.  Pt ambulatory with steady gait.  Skin: Skin is warm and dry. She is not diaphoretic.  Psychiatric: She has a normal mood and affect. Her behavior is normal.     ED Treatments / Results  Labs (all labs ordered are listed, but only abnormal results are displayed) Labs Reviewed  BASIC METABOLIC PANEL - Abnormal; Notable for the following:       Result Value   Glucose, Bld 135 (*)    All other components within normal limits  CBC    EKG  EKG Interpretation  Date/Time:  Monday December 29 2015 15:08:44 EST Ventricular Rate:  111 PR Interval:    QRS Duration: 87 QT Interval:  321 QTC Calculation: 437 R Axis:   58 Text Interpretation:  Sinus tachycardia Right atrial enlargement Low voltage, precordial leads Anteroseptal infarct, old Since last tracing rate faster Confirmed by KNAPP  MD-J, JON ([id number]) on 12/29/2015 3:25:02 PM       Radiology Dg Chest  2 View  Result Date: 12/29/2015 CLINICAL DATA:  Nonproductive cough.  Dyspnea. EXAM: CHEST  2 VIEW COMPARISON:  10/18/2015 FINDINGS: The heart size and mediastinal contours are within normal limits. Both lungs are clear. The visualized skeletal structures are unremarkable. IMPRESSION: No active cardiopulmonary disease. Electronically Signed   By: Andreas Newport M.D.   On: 12/29/2015 18:59    Procedures Procedures (including critical care time)  Medications Ordered in ED Medications  albuterol (PROVENTIL) (2.5 MG/3ML) 0.083% nebulizer solution 5 mg (5 mg Nebulization Given 12/29/15 1512)  methylPREDNISolone sodium succinate (SOLU-MEDROL) 125 mg/2 mL injection 125 mg (125 mg Intravenous Given 12/29/15 1727)  ipratropium (ATROVENT) nebulizer solution 0.5 mg (0.5 mg Nebulization Given 12/29/15 1706)    Vitals:   12/29/15 1917 12/29/15 2203 12/29/15 2208 12/29/15 2233  BP: 148/84 144/83    Pulse: 99 98  100  Resp: '24 18  18  '$ Temp: 98.2 F (36.8 C) 98.2 F (36.8 C)  98.3 F (36.8 C)  TempSrc: Oral Oral  Oral  SpO2: 96% (!) 86% (!) 88% 91%  Weight:      Height:         Initial Impression / Assessment and Plan / ED Course  I have reviewed the triage vital signs and the nursing notes.  Pertinent labs & imaging results that were available during my care of the patient were reviewed by me and considered in my medical decision making (see chart for details).  Clinical Course as of Dec 30 227  Mon Dec 29, 2015  1905 On re-evaluation patient endorses improvement in breathing. She still complains of coughing. Lungs are CTABL.   [AM]  1905 DG Chest 2 View [AM]  2215 On re-evaluation patient endorses improvement in breathing. Her O2 sats on my assessment range from 90-95% on RA.   [AM]    Clinical Course User Index [AM] Roxanna Mew, PA-C    Patient presents to ED with complaint of cough and SOB. Patient is afebrile and non-toxic appearing in NAD. Initial vital signs  remarkable for tachycardia and tachypnea. Wheezing appreciated on exam. Reproducible chest wall tenderness to palpation. Abdomen is soft and non-tender. Patient reports improvement following nebulizer tx. Will check basic labs, EKG, and CXR. IM steroids. Cont neb ordered. Will re-assess. Pt expresses an interest to not being hospitalized. Discussed pt with Dr. Ralene Bathe, who also evaluated pt, agrees with plan.   EKG shows sinus tachycardia, right atrial enlargement, and low voltage. CXR negative for acute infiltrate, pleural effusion, or PTX. Labs are grossly unremarkable. Pt endorses improvement in breathing following cont neb, but still complains of cough. No wheezing appreciated on exam. ?COPD exacerbation secondary to recent URI.  Pt O2sats to 86% on RA. Pt placed on [place] O2. ?secondary to recent nebulizer treatment. Will monitor and ambulate.   Pt ambulated on RA maintaining O2 sats at 94%; denied dizziness or SOB. However, sitting O2 sats recorded at 88%, on my assessment ranging from 90-95%. Discussed low O2 levels with patient and encouraged patient to stay in hospital for further assessment and need for possible O2, discussed risk of worsening O2 levels and condition. She understands the risks of leaving; states she feels better and does not want to stay. Patient is competent to make medical decisions. Rx short course of PO steroids and tessalon perles. Encouraged continued use of nebulizer at home as well as inhaler. Encouraged follow up appointment with PCP in next 2-3 days. Strict return precautions given. Pt voiced understanding and is agreeable.    Final Clinical Impressions(s) / ED Diagnoses   Final diagnoses:  SOB (shortness of breath)    New Prescriptions Discharge Medication List as of 12/29/2015  9:59 PM    START taking these medications   Details  benzonatate (TESSALON) 100 MG capsule Take 1 capsule (100 mg total) by mouth every 8 (eight) hours., Starting Mon 12/29/2015, Print          [place], [place] 12/30/15 3335    Quintella Reichert, MD 12/31/15 [phone]

## 2015-12-29 NOTE — ED Notes (Signed)
Bed: WA21 Expected date:  Expected time:  Means of arrival:  Comments: Triage 1 

## 2015-12-29 NOTE — ED Notes (Signed)
Pt tolerated ambulation well. Denied any feelings of dizziness or shortness of breath. Only complaint was of a headache. O2 sats dropped to 94 with no oxygen during ambulation.

## 2015-12-29 NOTE — Discharge Instructions (Signed)
Read the information below.  Your chest x-ray did not show any acute pneumonia. You were given a breathing treatment and steroids with improvement. I have prescribed a short course of oral steroids, please take an monitor your sugars. Continue to use your nebulizer, inhaler, and Spiriva.  I have prescribed tessalon perles for cough relief. You can also try honey, warm liquids, and a cool mist humidifier for cough relief.  Please call and follow up with your primary doctor in the next 2-3 days for re-evaluation.  Use the prescribed medication as directed.  Please discuss all new medications with your pharmacist.   You may return to the Emergency Department at any time for worsening condition or any new symptoms that concern you. Return to ED if you develop fever, worsening shortness of breath, chest pain, loss of consciousness, or any other new/concerning symptoms.

## 2015-12-29 NOTE — ED Triage Notes (Signed)
Pt complaint of SOB related to hx COPD. Pt verbalizes recently getting over cold but continued SOB with associated neck and central chest pain ONLY with cough.

## 2016-01-15 ENCOUNTER — Emergency Department (HOSPITAL_COMMUNITY): Payer: Self-pay

## 2016-01-15 ENCOUNTER — Emergency Department (HOSPITAL_COMMUNITY)
Admission: EM | Admit: 2016-01-15 | Discharge: 2016-01-15 | Disposition: A | Discharge status: Home / Self Care | Payer: Self-pay | Attending: Emergency Medicine | Admitting: Emergency Medicine

## 2016-01-15 ENCOUNTER — Encounter (HOSPITAL_COMMUNITY): Payer: Self-pay | Admitting: Emergency Medicine

## 2016-01-15 DIAGNOSIS — I2510 Atherosclerotic heart disease of native coronary artery without angina pectoris: Secondary | ICD-10-CM | POA: Insufficient documentation

## 2016-01-15 DIAGNOSIS — Z7982 Long term (current) use of aspirin: Secondary | ICD-10-CM | POA: Insufficient documentation

## 2016-01-15 DIAGNOSIS — Z794 Long term (current) use of insulin: Secondary | ICD-10-CM | POA: Insufficient documentation

## 2016-01-15 DIAGNOSIS — F17210 Nicotine dependence, cigarettes, uncomplicated: Secondary | ICD-10-CM | POA: Insufficient documentation

## 2016-01-15 DIAGNOSIS — J189 Pneumonia, unspecified organism: Secondary | ICD-10-CM | POA: Insufficient documentation

## 2016-01-15 DIAGNOSIS — J441 Chronic obstructive pulmonary disease with (acute) exacerbation: Secondary | ICD-10-CM | POA: Insufficient documentation

## 2016-01-15 DIAGNOSIS — E1140 Type 2 diabetes mellitus with diabetic neuropathy, unspecified: Secondary | ICD-10-CM | POA: Insufficient documentation

## 2016-01-15 DIAGNOSIS — I10 Essential (primary) hypertension: Secondary | ICD-10-CM | POA: Insufficient documentation

## 2016-01-15 LAB — CBC WITH DIFFERENTIAL/PLATELET
Basophils Absolute: 0 10*3/uL (ref 0.0–0.1)
Basophils Relative: 0 %
EOS PCT: 7 %
Eosinophils Absolute: 0.7 10*3/uL (ref 0.0–0.7)
HCT: 36.6 % (ref 36.0–46.0)
Hemoglobin: 12.5 g/dL (ref 12.0–15.0)
LYMPHS ABS: 2.5 10*3/uL (ref 0.7–4.0)
LYMPHS PCT: 24 %
MCH: 29.3 pg (ref 26.0–34.0)
MCHC: 34.2 g/dL (ref 30.0–36.0)
MCV: 85.7 fL (ref 78.0–100.0)
MONO ABS: 0.4 10*3/uL (ref 0.1–1.0)
MONOS PCT: 4 %
Neutro Abs: 6.6 10*3/uL (ref 1.7–7.7)
Neutrophils Relative %: 65 %
PLATELETS: 352 10*3/uL (ref 150–400)
RBC: 4.27 MIL/uL (ref 3.87–5.11)
RDW: 13.9 % (ref 11.5–15.5)
WBC: 10.2 10*3/uL (ref 4.0–10.5)

## 2016-01-15 LAB — BASIC METABOLIC PANEL
Anion gap: 9 (ref 5–15)
BUN: 11 mg/dL (ref 6–20)
CHLORIDE: 105 mmol/L (ref 101–111)
CO2: 24 mmol/L (ref 22–32)
Calcium: 9 mg/dL (ref 8.9–10.3)
Creatinine, Ser: 0.64 mg/dL (ref 0.44–1.00)
GFR calc Af Amer: >60 mL/min (ref >60)
GFR calc non Af Amer: >60 mL/min (ref >60)
GLUCOSE: 210 mg/dL — AB (ref 65–99)
POTASSIUM: 3.8 mmol/L (ref 3.5–5.1)
Sodium: 138 mmol/L (ref 135–145)

## 2016-01-15 LAB — BRAIN NATRIURETIC PEPTIDE: B Natriuretic Peptide: 42.2 pg/mL (ref 0.0–100.0)

## 2016-01-15 LAB — I-STAT TROPONIN, ED: Troponin i, poc: 0 ng/mL (ref 0.00–0.08)

## 2016-01-15 LAB — D-DIMER, QUANTITATIVE: D-Dimer, Quant: 0.36 ug/mL-FEU (ref 0.00–0.50)

## 2016-01-15 MED ORDER — HYDROCODONE-HOMATROPINE 5-1.5 MG/5ML PO SYRP: 5.0000 mL | ORAL_SOLUTION | Freq: Four times a day (QID) | ORAL | 0 refills | Status: DC | PRN

## 2016-01-15 MED ORDER — IPRATROPIUM BROMIDE 0.02 % IN SOLN
1.0000 mg | Freq: Once | RESPIRATORY_TRACT | Status: AC
  Administered 2016-01-15: 1 mg via RESPIRATORY_TRACT
  Filled 2016-01-15: qty 5

## 2016-01-15 MED ORDER — METHYLPREDNISOLONE SODIUM SUCC 125 MG IJ SOLR
125.0000 mg | Freq: Once | INTRAMUSCULAR | Status: AC
  Administered 2016-01-15: 125 mg via INTRAVENOUS
  Filled 2016-01-15: qty 2

## 2016-01-15 MED ORDER — LEVOFLOXACIN 750 MG PO TABS: 750.0000 mg | ORAL_TABLET | Freq: Every day | ORAL | 0 refills | Status: DC

## 2016-01-15 MED ORDER — ALBUTEROL (5 MG/ML) CONTINUOUS INHALATION SOLN
10.0000 mg/h | INHALATION_SOLUTION | Freq: Once | RESPIRATORY_TRACT | Status: AC
  Administered 2016-01-15: 10 mg/h via RESPIRATORY_TRACT
  Filled 2016-01-15: qty 20

## 2016-01-15 MED ORDER — MAGNESIUM SULFATE 2 GM/50ML IV SOLN
2.0000 g | Freq: Once | INTRAVENOUS | Status: AC
  Administered 2016-01-15: 2 g via INTRAVENOUS
  Filled 2016-01-15: qty 50

## 2016-01-15 MED ORDER — PREDNISONE 20 MG PO TABS: 40.0000 mg | ORAL_TABLET | Freq: Every day | ORAL | 0 refills | Status: DC

## 2016-01-15 MED ORDER — HYDROCODONE-HOMATROPINE 5-1.5 MG/5ML PO SYRP
5.0000 mL | ORAL_SOLUTION | Freq: Once | ORAL | Status: AC
  Administered 2016-01-15: 5 mL via ORAL
  Filled 2016-01-15: qty 5

## 2016-01-15 NOTE — ED Triage Notes (Signed)
Per [facility] pt from home , COPD exacerbation  . On home nebulizer per EMS. Pt received 5 mg Albuterol neb prior to arrival.  O2 sat 100 8 L . Alert and oriented x 4.

## 2016-01-15 NOTE — Discharge Instructions (Signed)
Your chest xray today showed possible early pneumonia.  Your labs are normal.  Please start taking Levaquin (for pneumonia), prednisone (for COPD), and hycodan (for cough).  Continue with your albuterol and mucinex.  Follow up with your primary care physician in the next couple of days.  Return to the ED for any new or concerning symptoms.

## 2016-01-15 NOTE — ED Notes (Signed)
Pulse oximetry when sitting 91%, during ambulation improved to 95%.

## 2016-01-15 NOTE — ED Notes (Signed)
Bed: WA17 Expected date:  Expected time:  Means of arrival:  Comments: EMS 

## 2016-01-15 NOTE — ED Provider Notes (Signed)
[facility] DEPT Provider Note   CSN: [id number] Arrival date & time: 01/15/16  1459     History   Chief Complaint Chief Complaint  Patient presents with  . Shortness of Breath    HPI Cassidy [name] is a 51 y.o. female.  HPI Cassidy [name] is a 51 y.o. female with PMH significant for COPD, DM, CAD s/p stenting, and HTN who presents with shortness of breath.  She arrives via EMS after 1 albuterol nebulizer.  She states she was admitted October for COPD exacerbation then seen again in the ED in December and states her shortness of breath never completely went away.  Associated symptoms include wheezing, cough with increased sputum production, HA from coughing, sore throat from coughing, and CP only when coughing.  She last received IM steroids at her PCP 3 days ago.  She has been using her albuterol, spiriva, and nebulizer with minimal relief.  She has had antibiotics, but cannot remember when she last had them. No leg swelling, unilateral edema, hx of DVT/PE.  She is no longer smoking.   Blood pressure 148/64, pulse 101, temperature 98.8 F (37.1 C), temperature source Oral, resp. rate 25, SpO2 90 %.   Past Medical History:  Diagnosis Date  . Anemia   . Atherosclerotic heart disease of native coronary artery with angina pectoris ([place]) 05/2012, 10/2013   a.  s/p PCTA to dRCA and ostial RPAV-PLA vessel + PDA branch (05/2012)  b. [place] s/p DES to mLAD - Resolute DES 3.0 x 22 (3.43m -->3.3 mm)  . Bipolar depression ([place]   . Breast abscess    a. right side.   .Marland KitchenCOPD (chronic obstructive pulmonary disease) ([facility]   . Depression   . Diabetic peripheral neuropathy ([facility]   . Fibromyalgia   . Genital warts   . GERD (gastroesophageal reflux disease)   . HLD (hyperlipidemia)   . Hypertension   . Pain in limb    a. LE VENOUS DUPLEX, 02/05/2009 - no evidence of deep vein thrombosis, Baker's cyst  . PTSD (post-traumatic stress disorder)   . Sickle cell trait ([place]   . Tobacco abuse   .  Tooth caries   . Type II diabetes mellitus ([facility]     Patient Active Problem List   Diagnosis Date Noted  . UTI (urinary tract infection), bacterial   . Obstructive chronic bronchitis with exacerbation ([place]   . Hypoxia 10/18/2015  . COPD exacerbation ([facility] 10/18/2015  . Hyperlipidemia with target LDL less than 70   . Type II diabetes mellitus with nephropathy ([facility]   . Fibromyalgia   . Diabetic peripheral neuropathy ([facility]   . Bipolar depression ([facility]   . PTSD (post-traumatic stress disorder)   . CAD S/P percutaneous coronary angioplasty 11/02/2013  . Leiomyoma of uterus, unspecified 07/20/2012  . BV (bacterial vaginosis) 07/20/2012  . Breast pain, right 07/17/2012  . Essential hypertension 05/26/2012  . Tobacco abuse 05/26/2012  . Unstable angina pectoris ([place] 05/26/2012  . Family history of early CAD 05/26/2012  . Perirectal abscess 03/14/2012  . Abscess of right breast 12/18/2010  . CARPAL TUNNEL SYNDROME, LEFT 12/18/2009  . BREAST MASS, RIGHT 08/13/2008  . INGROWN HAIR 05/03/2008  . TINEA VERSICOLOR 04/06/2007  . ABDOMINAL/PELVIC SWELLING MASS/LUMP EPIGASTRIC 04/06/2007  . BIPOLAR I D/O MOST RECENT EPIS DEPRESSED MOD 11/24/2006  . HERPES, GENITAL NEC 09/30/2006  . SUBSTANCE ABUSE 09/30/2006  . GERD 09/30/2006  . PLANTAR FASCIITIS 09/30/2006    Past Surgical History:  Procedure Laterality Date  .  BLADDER SURGERY  1980   "TVT"  . BREAST SURGERY Right    I&D for multiple abscesses  . cutting balloon    . EYE SURGERY Left    laser surgery  . INCISE AND DRAIN ABCESS  ; 04/26/11; 08/13/11   right breast  . INCISION AND DRAINAGE ABSCESS Right 05/09/2012   Procedure: INCISION AND DRAINAGE RIGHT BREAST ABSCESS;  Surgeon: Imogene Burn. Georgette Dover, MD;  Location: [place];  Service: General;  Laterality: Right;  . INCISION AND DRAINAGE ABSCESS Right 02/08/2014   Procedure: INCISION AND DRAINAGE RIGHT BREAST ABSCESS;  Surgeon: Donnie Mesa, MD;  Location: [place];  Service: General;   Laterality: Right;  . INCISION AND DRAINAGE ABSCESS Right 06/14/2014   Procedure: INCISION AND DRAINAGE RIGHT BREAST ABSCESS;  Surgeon: Donnie Mesa, MD;  Location: [place];  Service: General;  Laterality: Right;  . IRRIGATION AND DEBRIDEMENT ABSCESS  12/19/2010   Procedure: IRRIGATION AND DEBRIDEMENT ABSCESS;  Surgeon: Rolm Bookbinder, MD;  Location: [facility];  Service: General;  Laterality: Right;  . IRRIGATION AND DEBRIDEMENT ABSCESS  08/13/2011   Procedure: MINOR INCISION AND DRAINAGE OF ABSCESS;  Surgeon: Gwenyth Ober, MD;  Location: [place];  Service: General;  Laterality: Right;  Right Breast   . IRRIGATION AND DEBRIDEMENT ABSCESS  11/17/2011   Procedure: IRRIGATION AND DEBRIDEMENT ABSCESS;  Surgeon: Imogene Burn. Georgette Dover, MD;  Location: [facility] [facility];  Service: General;  Laterality: Right;  irrigation and debridement right recurrent breast abscess  . LARYNX SURGERY    . LEFT HEART CATHETERIZATION WITH CORONARY ANGIOGRAM N/A 05/29/2012   Procedure: LEFT HEART CATHETERIZATION WITH CORONARY ANGIOGRAM & PTCA;  Surgeon: Troy Sine, MD;  Location: [facility] CATH LAB;  Service: Cardiovascular; Bifurcation dRCA-RPAD/PLA & rPDA  PTCA.  Marland Kitchen LEFT HEART CATHETERIZATION WITH CORONARY ANGIOGRAM N/A 11/08/2013   Procedure: LEFT HEART CATHETERIZATION WITH CORONARY ANGIOGRAM and Coronary Stent Intervention;  Surgeon: Leonie Man, MD;  Location: [facility] CATH LAB;  Service: Cardiovascular; mLAD 80% (FFR 0.73) - resolute DES 3.0 x 22 mm (postdilated to 3.3 mm->3.5 mm)  . NM MYOVIEW LTD  01/26/2009   Normal study, no evidence of ischemia, EF 67%  . ployp removed from voice box 03/30/12    . REFRACTIVE SURGERY  ~ 2010   right  . TONSILLECTOMY  1988    OB History    Gravida Para Term Preterm AB Living   '1 1 1     1   '$ SAB TAB Ectopic Multiple Live Births                   Home Medications    Prior to Admission medications   Medication Sig Start Date End Date Taking? Authorizing Provider  acyclovir (ZOVIRAX) 400 MG tablet Take  1 tablet (400 mg total) by mouth 3 (three) times daily. 10/14/15  Yes Shelly Bombard, MD  albuterol (PROVENTIL HFA;VENTOLIN HFA) 108 (90 Base) MCG/ACT inhaler Inhale 1-2 puffs into the lungs every 6 (six) hours as needed for wheezing or shortness of breath. 03/27/15  Yes Alexandra M Law, PA-C  albuterol (PROVENTIL) (2.5 MG/3ML) 0.083% nebulizer solution Take 2.5 mg by nebulization every 6 (six) hours as needed for wheezing or shortness of breath.   Yes Historical Provider, MD  aspirin EC 81 MG tablet Take 81 mg by mouth daily.   Yes Historical Provider, MD  baclofen (LIORESAL) 10 MG tablet Take 10 mg by mouth 3 (three) times daily. 10/08/15  Yes Historical Provider, MD  benzonatate (TESSALON) 100  MG capsule Take 1 capsule (100 mg total) by mouth every 8 (eight) hours. 12/29/15  Yes Roxanna Mew, PA-C  budesonide-formoterol [facility]) 160-4.5 MCG/ACT inhaler Inhale 2 puffs into the lungs 2 (two) times daily. 10/21/15  Yes Eugenie Filler, MD  buPROPion [facility] SR) 150 MG 12 hr tablet Take 150 mg by mouth 2 (two) times daily. 09/25/15  Yes Historical Provider, MD  carvedilol (COREG) 6.25 MG tablet Take 1 tablet (6.25 mg total) by mouth 2 (two) times daily with a meal. 04/15/15  Yes Leonie Man, MD  clopidogrel (PLAVIX) 75 MG tablet Take 1 tablet (75 mg total) by mouth daily. 04/15/15  Yes Leonie Man, MD  diclofenac sodium (VOLTAREN) 1 % GEL Apply 1 application topically 3 (three) times daily as needed (pain).   Yes Historical Provider, MD  fluticasone (FLONASE) 50 MCG/ACT nasal spray Place 1 spray into both nostrils daily as needed (congestion).  12/11/13  Yes Historical Provider, MD  furosemide (LASIX) 20 MG tablet Take 20 mg by mouth daily. 04/10/15  Yes Historical Provider, MD  gabapentin (NEURONTIN) 600 MG tablet Take 600 mg by mouth 3 (three) times daily. 01/09/16  Yes Historical Provider, MD  Insulin Detemir (LEVEMIR FLEXTOUCH) 100 UNIT/ML Pen Inject 20 Units into the skin daily at  10 pm. 03/20/15  Yes Philemon Kingdom, MD  losartan (COZAAR) 50 MG tablet Take 50 mg by mouth daily. 09/09/15  Yes Historical Provider, MD  lubiprostone (AMITIZA) 24 MCG capsule Take 24 mcg by mouth 2 (two) times daily with a meal.   Yes Historical Provider, MD  metFORMIN (GLUCOPHAGE-XR) 500 MG 24 hr tablet Take 2 tablets (1,000 mg total) by mouth daily with supper. Patient taking differently: Take 1,000 mg by mouth daily with breakfast.  08/12/15  Yes Philemon Kingdom, MD  NOVOLOG FLEXPEN 100 UNIT/ML FlexPen INJECT 10-12 UNITS INTO THE SKIN 3 TIMES DAILY WITH MEALS. PLUS SLIDING SCALE. Patient taking differently: INJECT 12 -15 SUBCUTANEOUSLY BEFORE BREAKFAST AND LUNCH BASED ON CBG 07/09/15  Yes Philemon Kingdom, MD  nystatin (MYCOSTATIN) 100000 UNIT/ML suspension Swish and swallow 36ms by mouth every 6 hours 01/09/16  Yes Historical Provider, MD  oxyCODONE-acetaminophen (PERCOCET) 10-325 MG tablet Take 1 tablet by mouth every 4 (four) hours as needed for pain.   Yes Historical Provider, MD  pantoprazole (PROTONIX) 40 MG tablet Take 40 mg by mouth daily. 09/05/15  Yes Historical Provider, MD  promethazine (PHENERGAN) 25 MG tablet TAKE 1/2-1 TABLET BY MOUTH THREE TIMES PER DAY AS NEEDED FOR NAUSEA OR VOMITING 12/30/15  Yes Historical Provider, MD  simvastatin (ZOCOR) 40 MG tablet TAKE 1 TABLET BY MOUTH ONCE A DAY AT 6:00 PM 06/11/15  Yes DLeonie Man MD  tiotropium (SPIRIVA HANDIHALER) 18 MCG inhalation capsule Place 1 capsule (18 mcg total) into inhaler and inhale daily. 10/21/15  Yes DEugenie Filler MD  traZODone (DESYREL) 100 MG tablet Take 50 mg by mouth at bedtime as needed for sleep.    Yes Historical Provider, MD  vitamin B-12 (CYANOCOBALAMIN) 1000 MCG tablet Take 1,000 mcg by mouth daily.   Yes Historical Provider, MD  ACCU-CHEK AVIVA PLUS test strip TEST BLOOD SUGAR THREE TIMES DAILY AS DIRECTED 10/31/15   CPhilemon Kingdom MD  Albiglutide (TANZEUM) 50 MG PEN Inject 50 mg weekly under  skin Patient not taking: Reported on 01/15/2016 08/12/15   CPhilemon Kingdom MD  clindamycin (CLEOCIN T) 1 % lotion Apply 1 application topically 2 (two) times daily. Apply to eczema on shoulders 08/12/15  Historical Provider, MD  fluconazole (DIFLUCAN) 200 MG tablet Take 1 tablet (200 mg total) by mouth every other day. Patient not taking: Reported on 01/15/2016 11/13/15   Shelly Bombard, MD  HYDROcodone-homatropine [facility]) 5-1.5 MG/5ML syrup Take 5 mLs by mouth every 6 (six) hours as needed for cough. 01/15/16   Gloriann Loan, PA-C  Insulin Pen Needle 32G X 4 MM MISC Use to inject insulin 4 times daily. 03/20/15   Philemon Kingdom, MD  Insulin Syringe-Needle U-100 (INSULIN SYRINGE 1CC/31GX5/16") 31G X 5/16" 1 ML MISC USE AS DIRECTED FOUR TIMES DAILY FOR INSULIN INJECTIONS. 04/28/15   Philemon Kingdom, MD  ipratropium-albuterol (DUONEB) 0.5-2.5 (3) MG/3ML SOLN Inhale 1 vial via nebulizer 2 times daily 01/09/16   Historical Provider, MD  levofloxacin (LEVAQUIN) 750 MG tablet Take 1 tablet (750 mg total) by mouth daily. 01/15/16   Jennesis Ramaswamy, PA-C  mometasone (ELOCON) 0.1 % cream Apply 1 application topically 2 (two) times daily as needed (wound care (vaginal boils)).  09/09/15   Historical Provider, MD  nitroGLYCERIN (NITROSTAT) 0.4 MG SL tablet Place 1 tablet (0.4 mg total) under the tongue every 5 (five) minutes as needed for chest pain. 04/15/15   Leonie Man, MD  predniSONE (DELTASONE) 20 MG tablet Take 2 tablets (40 mg total) by mouth daily with breakfast. 01/15/16   Gloriann Loan, PA-C  TANZEUM 30 MG PEN Inject 30 mg as directed once a week. On Sun 12/29/15   Historical Provider, MD    Family History Family History  Problem Relation Age of Onset  . COPD Mother   . Cancer Mother     lymphoma  . Heart disease Mother   . Stomach cancer Mother   . Hyperlipidemia Father   . Heart disease Father   . Hypertension Maternal Grandmother   . Diabetes Maternal Grandmother   . Cancer Maternal Aunt     breast,  colon  . Crohn's disease Maternal Aunt   . Hypertension Brother   . Esophageal cancer Neg Hx   . Rectal cancer Neg Hx   . Liver cancer Neg Hx     Social History Social History  Substance Use Topics  . Smoking status: Current Every Day Smoker    Packs/day: 0.25    Years: 30.00    Types: Cigarettes  . Smokeless tobacco: Never Used     Comment: 08/14/11 "quit for 3 wk 05/2011; was smoking 1 ppd since age 64 before I quit; at least I've cut down to 1/4 ppd"  . 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 She appears well-developed and well-nourished.  Non-toxic appearance. She does not have a sickly appearance. She does not appear ill.  HENT:  Head: Normocephalic and atraumatic.  Mouth/Throat: Oropharynx is clear and moist.  Eyes: Conjunctivae are normal. Pupils are equal, round, and reactive to light.  Neck: Normal range of motion. Neck supple.  Cardiovascular: Normal rate and regular rhythm.   Lower extremities symmetric and without edema.   Pulmonary/Chest: Effort normal. No accessory muscle usage or  stridor. No respiratory distress. She has wheezes. She has rhonchi. She has no rales.  She fluctuates between 90-95% satts on RA.   Abdominal: Soft. Bowel sounds are normal. She exhibits no distension. There is no tenderness.  Musculoskeletal: Normal range of motion.  Lymphadenopathy:    She has no cervical adenopathy.  Neurological: She is alert and oriented to person, place, and time.  Speech clear without dysarthria.  Skin: Skin is warm and dry.  Psychiatric: She has a normal  mood and affect. Her behavior is normal.     ED Treatments / Results  Labs (all labs ordered are listed, but only abnormal results are displayed) Labs Reviewed  BASIC METABOLIC PANEL - Abnormal; Notable for the following:       Result Value   Glucose, Bld 210 (*)    All other components within normal limits  CBC WITH DIFFERENTIAL/PLATELET  D-DIMER, QUANTITATIVE (NOT AT [facility])  BRAIN NATRIURETIC PEPTIDE  I-STAT TROPOININ, ED    EKG  EKG Interpretation  Date/Time:  Thursday January 15 2016 15:20:50 EST Ventricular Rate:  100 PR Interval:    QRS Duration: 85 QT Interval:  352 QTC Calculation: 099 R Axis:   31 Text Interpretation:  Sinus tachycardia Confirmed by Hazle Coca [phone]) on 01/15/2016 4:31:29 PM       Radiology Dg Chest 2 View  Result Date: 01/15/2016 CLINICAL DATA:  Shortness of breath, COPD EXAM: CHEST  2 VIEW COMPARISON:  12/29/2015 FINDINGS: Cardiomediastinal silhouette is stable. No pulmonary edema. There is linear atelectasis or scarring in lingula. Streaky atelectasis or early infiltrate in right upper lobe perihilar region. IMPRESSION: No pulmonary edema. Streaky atelectasis or early infiltrate in right upper lobe suprahilar region. Electronically Signed   By: Lahoma Crocker M.D.   On: 01/15/2016 15:43    Procedures Procedures (including critical care time)  Medications Ordered in ED Medications  HYDROcodone-homatropine (HYCODAN) 5-1.5 MG/5ML syrup 5 mL (5 mLs Oral Given 01/15/16 1633)  methylPREDNISolone sodium succinate (SOLU-MEDROL) 125 mg/2 mL injection 125 mg (125 mg Intravenous Given 01/15/16 1633)  magnesium sulfate IVPB 2 g 50 mL (0 g Intravenous Stopped 01/15/16 1818)  albuterol (PROVENTIL,VENTOLIN) solution continuous neb (10 mg/hr Nebulization Given 01/15/16 1616)  ipratropium (ATROVENT) nebulizer solution 1 mg (1 mg Nebulization Given 01/15/16 1615)     Initial Impression / Assessment and Plan / ED Course  I have reviewed the triage vital signs and the  nursing notes.  Pertinent labs & imaging results that were available during my care of the patient were reviewed by me and considered in my medical decision making (see chart for details).  Clinical Course    Patient with hx of COPD presents with shortness of breath with worsening productive cough.  Vitals stable, she is tachycardic likely secondary to albuterol.  EKG without acute changes.  Labs without acute abnormalities.  CXR concerning for possible developing PNA.  Patient feels improved after CAT, solumedrol, and magnesium.  Work up including dimer is negative.  Oxygen saturations actually improved with ambulation.  She has remained with good satts on RA throughout ED stay. Suspect COPD exacerbation with developing PNA.  Low clinical suspicion for ACS, PE, or dissection.  Plan to discharge home with levaquin, prednisone, and hycodan.  She is encouraged to follow up with her PCP in the next couple of days. Return precautions discussed.  Stable for discharge.   Case has been discussed with and seen by Dr. Ralene Bathe who agrees with the above plan for discharge.   Final  Clinical Impressions(s) / ED Diagnoses   Final diagnoses:  HCAP (healthcare-associated pneumonia)  COPD exacerbation ([place])    New Prescriptions New Prescriptions   HYDROCODONE-HOMATROPINE (HYCODAN) 5-1.5 MG/5ML SYRUP    Take 5 mLs by mouth every 6 (six) hours as needed for cough.   LEVOFLOXACIN (LEVAQUIN) 750 MG TABLET    Take 1 tablet (750 mg total) by mouth daily.   PREDNISONE (DELTASONE) 20 MG TABLET    Take 2 tablets (40 mg total) by mouth daily with breakfast.     Gloriann Loan, PA-C 01/15/16 2009    Quintella Reichert, MD 01/17/16 [phone]

## 2016-01-27 ENCOUNTER — Encounter: Payer: Medicaid Other | Admitting: Gastroenterology

## 2016-01-30 ENCOUNTER — Telehealth: Payer: Self-pay | Admitting: *Deleted

## 2016-01-30 NOTE — Telephone Encounter (Signed)
Received PA form from pharmacy.  Called to verify PA need since Acyclovir is typically covered by Medicaid. Pharmacy states it seems that pt does not have insurance coverage.  Attempt to contact pt. LM on VM making her aware to verify insurance is active and follow up with pharmacy. Pt advised to call office if she has any other questions.

## 2017-01-28 ENCOUNTER — Other Ambulatory Visit: Payer: Self-pay | Admitting: Obstetrics

## 2017-01-28 ENCOUNTER — Telehealth: Payer: Self-pay | Admitting: *Deleted

## 2017-01-28 DIAGNOSIS — A6009 Herpesviral infection of other urogenital tract: Secondary | ICD-10-CM

## 2017-01-28 MED ORDER — ACYCLOVIR 400 MG PO TABS: 400.0000 mg | ORAL_TABLET | Freq: Three times a day (TID) | ORAL | 11 refills | Status: DC

## 2017-01-28 NOTE — Telephone Encounter (Signed)
Acyclovir Rx for genital herpes.

## 2017-01-28 NOTE — Telephone Encounter (Signed)
Pt called in requesting a refill for Acyclovir, pt has an annual scheduled for  02/04/2017 but was needing the medication before the appt if possible , please advise.Marland KitchenMarland Kitchen

## 2017-02-04 ENCOUNTER — Ambulatory Visit (INDEPENDENT_AMBULATORY_CARE_PROVIDER_SITE_OTHER): Payer: Medicaid Other | Admitting: Obstetrics

## 2017-02-04 ENCOUNTER — Other Ambulatory Visit: Payer: Self-pay

## 2017-02-04 ENCOUNTER — Other Ambulatory Visit (HOSPITAL_COMMUNITY)
Admission: RE | Admit: 2017-02-04 | Discharge: 2017-02-04 | Disposition: A | Discharge status: Home / Self Care | Payer: Medicaid Other | Source: Ambulatory Visit | Attending: Obstetrics | Admitting: Obstetrics

## 2017-02-04 ENCOUNTER — Encounter: Payer: Self-pay | Admitting: Obstetrics

## 2017-02-04 VITALS — BP 164/94 | HR 97 | Ht 68.0 in | Wt 229.0 lb

## 2017-02-04 DIAGNOSIS — Z01419 Encounter for gynecological examination (general) (routine) without abnormal findings: Secondary | ICD-10-CM

## 2017-02-04 DIAGNOSIS — I10 Essential (primary) hypertension: Secondary | ICD-10-CM

## 2017-02-04 DIAGNOSIS — N898 Other specified noninflammatory disorders of vagina: Secondary | ICD-10-CM | POA: Diagnosis present

## 2017-02-04 DIAGNOSIS — Z3049 Encounter for surveillance of other contraceptives: Secondary | ICD-10-CM | POA: Diagnosis not present

## 2017-02-04 DIAGNOSIS — A6009 Herpesviral infection of other urogenital tract: Secondary | ICD-10-CM

## 2017-02-04 MED ORDER — CARVEDILOL 6.25 MG PO TABS: 6.2500 mg | ORAL_TABLET | Freq: Two times a day (BID) | ORAL | 11 refills | Status: DC

## 2017-02-04 MED ORDER — LOSARTAN POTASSIUM 50 MG PO TABS: 50.0000 mg | ORAL_TABLET | Freq: Every day | ORAL | 11 refills | Status: DC

## 2017-02-04 MED ORDER — ACYCLOVIR 400 MG PO TABS: 400.0000 mg | ORAL_TABLET | Freq: Three times a day (TID) | ORAL | 11 refills | Status: DC

## 2017-02-04 NOTE — Progress Notes (Signed)
Subjective:        Cassidy [name] is a 52 y.o. female here for a routine exam.  Current complaints: Backache, headache, numbness and tingling of extremities.  Unemployed and unable to afford medications.  She has not been able to afford appointments to see her PCP and Specialists.   Personal health questionnaire:  Is patient [name], have a family history of breast and/or ovarian cancer: no Is there a family history of uterine cancer diagnosed at age < 25, gastrointestinal cancer, urinary tract cancer, family member who is a [name]: no Is the patient overweight and hypertensive, family history of diabetes, personal history of gestational diabetes, preeclampsia or PCOS: no Is patient over 7, have PCOS,  family history of premature CHD under age 78, diabetes, smoke, have hypertension or peripheral artery disease:  no At any time, has a partner hit, kicked or otherwise hurt or frightened you?: no Over the past 2 weeks, have you felt down, depressed or hopeless?: no Over the past 2 weeks, have you felt little interest or pleasure in doing things?:no   Gynecologic History Patient's last menstrual period was 06/12/2016 (approximate). Contraception: condoms Last Pap: 2017. Results were: normal Last mammogram: 2017. Results were: normal  Obstetric History OB History  Gravida Para Term Preterm AB Living  1 1 1     1   SAB TAB Ectopic Multiple Live Births               # Outcome Date GA Lbr Len/2nd Weight Sex Delivery Anes PTL Lv  1 Term               Past Medical History:  Diagnosis Date  . Anemia   . Atherosclerotic heart disease of native coronary artery with angina pectoris ([place]) 05/2012, 10/2013   a.  s/p PCTA to dRCA and ostial RPAV-PLA vessel + PDA branch (05/2012)  b. [place] s/p DES to mLAD - Resolute DES 3.0 x 22 (3.74mm -->3.3 mm)  . Bipolar depression ([place])   . Breast abscess    a. right side.   Marland Kitchen COPD (chronic obstructive pulmonary  disease) ([place])   . Depression   . Diabetic peripheral neuropathy ([place])   . Fibromyalgia   . Genital warts   . GERD (gastroesophageal reflux disease)   . HLD (hyperlipidemia)   . Hypertension   . Pain in limb    a. LE VENOUS DUPLEX, 02/05/2009 - no evidence of deep vein thrombosis, Baker's cyst  . PTSD (post-traumatic stress disorder)   . Sickle cell trait ([place])   . Tobacco abuse   . Tooth caries   . Type II diabetes mellitus ([place])     Past Surgical History:  Procedure Laterality Date  . BLADDER SURGERY  1980   "TVT"  . BREAST SURGERY Right    I&D for multiple abscesses  . cutting balloon    . EYE SURGERY Left    laser surgery  . INCISE AND DRAIN ABCESS  ; 04/26/11; 08/13/11   right breast  . INCISION AND DRAINAGE ABSCESS Right 05/09/2012   Procedure: INCISION AND DRAINAGE RIGHT BREAST ABSCESS;  Surgeon: Imogene Burn. Georgette Dover, MD;  Location: [place];  Service: General;  Laterality: Right;  . INCISION AND DRAINAGE ABSCESS Right 02/08/2014   Procedure: INCISION AND DRAINAGE RIGHT BREAST ABSCESS;  Surgeon: Donnie Mesa, MD;  Location: [place];  Service: General;  Laterality: Right;  . INCISION AND DRAINAGE ABSCESS Right 06/14/2014   Procedure: INCISION AND DRAINAGE RIGHT BREAST  ABSCESS;  Surgeon: Donnie Mesa, MD;  Location: [place];  Service: General;  Laterality: Right;  . IRRIGATION AND DEBRIDEMENT ABSCESS  12/19/2010   Procedure: IRRIGATION AND DEBRIDEMENT ABSCESS;  Surgeon: Rolm Bookbinder, MD;  Location: [place];  Service: General;  Laterality: Right;  . IRRIGATION AND DEBRIDEMENT ABSCESS  08/13/2011   Procedure: MINOR INCISION AND DRAINAGE OF ABSCESS;  Surgeon: Gwenyth Ober, MD;  Location: [place];  Service: General;  Laterality: Right;  Right Breast   . IRRIGATION AND DEBRIDEMENT ABSCESS  11/17/2011   Procedure: IRRIGATION AND DEBRIDEMENT ABSCESS;  Surgeon: Imogene Burn. Georgette Dover, MD;  Location: [facility];  Service: General;  Laterality: Right;  irrigation and debridement right recurrent breast abscess   . LARYNX SURGERY    . LEFT HEART CATHETERIZATION WITH CORONARY ANGIOGRAM N/A 05/29/2012   Procedure: LEFT HEART CATHETERIZATION WITH CORONARY ANGIOGRAM & PTCA;  Surgeon: Troy Sine, MD;  Location: [facility] CATH LAB;  Service: Cardiovascular; Bifurcation dRCA-RPAD/PLA & rPDA  PTCA.  Marland Kitchen LEFT HEART CATHETERIZATION WITH CORONARY ANGIOGRAM N/A 11/08/2013   Procedure: LEFT HEART CATHETERIZATION WITH CORONARY ANGIOGRAM and Coronary Stent Intervention;  Surgeon: Leonie Man, MD;  Location[facility];  Service: Cardiovascular; mLAD 80% (FFR 0.73) - resolute DES 3.0 x 22 mm (postdilated to 3.3 mm->3.5 mm)  . NM MYOVIEW LTD  01/26/2009   Normal study, no evidence of ischemia, EF 67%  . ployp removed from voice box 03/30/12    . REFRACTIVE SURGERY  ~ 2010   right  . TONSILLECTOMY  1988     Current Outpatient Medications:  .  ACCU-CHEK AVIVA PLUS test strip, TEST BLOOD SUGAR THREE TIMES DAILY AS DIRECTED, Disp: 100 each, Rfl: 2 .  acyclovir (ZOVIRAX) 400 MG tablet, Take 1 tablet (400 mg total) by mouth 3 (three) times daily., Disp: 30 tablet, Rfl: 11 .  Albiglutide (TANZEUM) 50 MG PEN, Inject 50 mg weekly under skin, Disp: 12 each, Rfl: 1 .  albuterol (PROVENTIL HFA;VENTOLIN HFA) 108 (90 Base) MCG/ACT inhaler, Inhale 1-2 puffs into the lungs every 6 (six) hours as needed for wheezing or shortness of breath., Disp: 1 Inhaler, Rfl: 0 .  albuterol (PROVENTIL) (2.5 MG/3ML) 0.083% nebulizer solution, Take 2.5 mg by nebulization every 6 (six) hours as needed for wheezing or shortness of breath., Disp: , Rfl:  .  aspirin EC 81 MG tablet, Take 81 mg by mouth daily., Disp: , Rfl:  .  baclofen (LIORESAL) 10 MG tablet, Take 10 mg by mouth 3 (three) times daily., Disp: , Rfl: 0 .  benzonatate (TESSALON) 100 MG capsule, Take 1 capsule (100 mg total) by mouth every 8 (eight) hours., Disp: 21 capsule, Rfl: 0 .  budesonide-formoterol (SYMBICORT) 160-4.5 MCG/ACT inhaler, Inhale 2 puffs into the lungs 2 (two) times daily.,  Disp: 1 Inhaler, Rfl: 3 .  carvedilol (COREG) 6.25 MG tablet, Take 1 tablet (6.25 mg total) by mouth 2 (two) times daily with a meal., Disp: 60 tablet, Rfl: 11 .  clindamycin (CLEOCIN T) 1 % lotion, Apply 1 application topically 2 (two) times daily. Apply to eczema on shoulders, Disp: , Rfl: 2 .  clopidogrel (PLAVIX) 75 MG tablet, Take 1 tablet (75 mg total) by mouth daily., Disp: 30 tablet, Rfl: 11 .  diclofenac sodium (VOLTAREN) 1 % GEL, Apply 1 application topically 3 (three) times daily as needed (pain)., Disp: , Rfl:  .  fluconazole (DIFLUCAN) 200 MG tablet, Take 1 tablet (200 mg total) by mouth every other day., Disp: 3 tablet,  Rfl: 2 .  fluticasone (FLONASE) 50 MCG/ACT nasal spray, Place 1 spray into both nostrils daily as needed (congestion). , Disp: , Rfl: 5 .  furosemide (LASIX) 20 MG tablet, Take 20 mg by mouth daily., Disp: , Rfl: 5 .  gabapentin (NEURONTIN) 600 MG tablet, Take 600 mg by mouth 3 (three) times daily., Disp: , Rfl: 0 .  Insulin Detemir (LEVEMIR FLEXTOUCH) 100 UNIT/ML Pen, Inject 20 Units into the skin daily at 10 pm., Disp: 15 mL, Rfl: 2 .  Insulin Pen Needle 32G X 4 MM MISC, Use to inject insulin 4 times daily., Disp: 130 each, Rfl: 5 .  Insulin Syringe-Needle U-100 (INSULIN SYRINGE 1CC/31GX5/16") 31G X 5/16" 1 ML MISC, USE AS DIRECTED FOUR TIMES DAILY FOR INSULIN INJECTIONS., Disp: 200 each, Rfl: 0 .  levofloxacin (LEVAQUIN) 750 MG tablet, Take 1 tablet (750 mg total) by mouth daily., Disp: 5 tablet, Rfl: 0 .  losartan (COZAAR) 50 MG tablet, Take 1 tablet (50 mg total) by mouth daily., Disp: 30 tablet, Rfl: 11 .  metFORMIN (GLUCOPHAGE-XR) 500 MG 24 hr tablet, Take 2 tablets (1,000 mg total) by mouth daily with supper. (Patient taking differently: Take 1,000 mg by mouth daily with breakfast. ), Disp: 180 tablet, Rfl: 1 .  nitroGLYCERIN (NITROSTAT) 0.4 MG SL tablet, Place 1 tablet (0.4 mg total) under the tongue every 5 (five) minutes as needed for chest pain., Disp: 25  tablet, Rfl: 5 .  NOVOLOG FLEXPEN 100 UNIT/ML FlexPen, INJECT 10-12 UNITS INTO THE SKIN 3 TIMES DAILY WITH MEALS. PLUS SLIDING SCALE. (Patient taking differently: INJECT 12 -15 SUBCUTANEOUSLY BEFORE BREAKFAST AND LUNCH BASED ON CBG), Disp: 15 mL, Rfl: 5 .  nystatin (MYCOSTATIN) 100000 UNIT/ML suspension, Swish and swallow 49mls by mouth every 6 hours, Disp: , Rfl: 0 .  pantoprazole (PROTONIX) 40 MG tablet, Take 40 mg by mouth daily., Disp: , Rfl: 1 .  predniSONE (DELTASONE) 20 MG tablet, Take 2 tablets (40 mg total) by mouth daily with breakfast., Disp: 8 tablet, Rfl: 0 .  simvastatin (ZOCOR) 40 MG tablet, TAKE 1 TABLET BY MOUTH ONCE A DAY AT 6:00 PM, Disp: 30 tablet, Rfl: 10 .  TANZEUM 30 MG PEN, Inject 30 mg as directed once a week. On Sun, Disp: , Rfl: 1 .  traZODone (DESYREL) 100 MG tablet, Take 50 mg by mouth at bedtime as needed for sleep. , Disp: , Rfl:  .  vitamin B-12 (CYANOCOBALAMIN) 1000 MCG tablet, Take 1,000 mcg by mouth daily., Disp: , Rfl:  .  buPROPion (WELLBUTRIN SR) 150 MG 12 hr tablet, Take 150 mg by mouth 2 (two) times daily., Disp: , Rfl: 5 .  HYDROcodone-homatropine (HYCODAN) 5-1.5 MG/5ML syrup, Take 5 mLs by mouth every 6 (six) hours as needed for cough., Disp: 80 mL, Rfl: 0 .  ipratropium-albuterol (DUONEB) 0.5-2.5 (3) MG/3ML SOLN, Inhale 1 vial via nebulizer 2 times daily, Disp: , Rfl: 3 .  lubiprostone (AMITIZA) 24 MCG capsule, Take 24 mcg by mouth 2 (two) times daily with a meal., Disp: , Rfl:  .  mometasone (ELOCON) 0.1 % cream, Apply 1 application topically 2 (two) times daily as needed (wound care (vaginal boils)). , Disp: , Rfl: 2 .  oxyCODONE-acetaminophen (PERCOCET) 10-325 MG tablet, Take 1 tablet by mouth every 4 (four) hours as needed for pain., Disp: , Rfl:  .  promethazine (PHENERGAN) 25 MG tablet, TAKE 1/2-1 TABLET BY MOUTH THREE TIMES PER DAY AS NEEDED FOR NAUSEA OR VOMITING, Disp: , Rfl: 0 .  tiotropium (SPIRIVA HANDIHALER) 18 MCG inhalation capsule, Place 1  capsule (18 mcg total) into inhaler and inhale daily. (Patient not taking: Reported on 02/04/2017), Disp: 30 capsule, Rfl: 3 Allergies  Allergen Reactions  . Amoxicillin Hives    Has patient had a PCN reaction causing immediate rash, facial/tongue/throat swelling, SOB or lightheadedness with hypotension: Yes Has patient had a PCN reaction causing severe rash involving mucus membranes or skin necrosis: No Has patient had a PCN reaction that required hospitalization No Has patient had a PCN reaction occurring within the last 10 years: Yes If all of the above answers are "NO", then may proceed with Cephalosporin use.    Social History   Tobacco Use  . Smoking status: Current Every Day Smoker    Packs/day: 0.25    Years: 30.00    Pack years: 7.50    Types: Cigarettes  . Smokeless tobacco: Never Used  . Tobacco comment: 08/14/11 "quit for 3 wk 05/2011; was smoking 1 ppd since age 73 before I quit; at least I've cut down to 1/4 ppd"  Substance Use Topics  . 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 COPD Mother   . Cancer Mother        lymphoma  . Heart disease Mother   . Stomach cancer Mother   . Hyperlipidemia Father   . Heart disease Father   . Hypertension Maternal Grandmother   . Diabetes Maternal Grandmother   . Cancer Maternal Aunt        breast, colon  . Crohn's disease Maternal Aunt   . Hypertension Brother   . Esophageal cancer Neg Hx   . Rectal cancer Neg Hx   . Liver cancer Neg Hx       Review of Systems  Constitutional: negative for fatigue and weight loss Respiratory: negative for cough and wheezing Cardiovascular: positive for chest pain, fatigue and palpitations Gastrointestinal: negative for abdominal pain and change in bowel habits Musculoskeletal:positive for myalgias Neurological: positive for headaches and numbness and  tingling of extremities  Behavioral/Psych: negative for abusive relationship, depression Endocrine: negative for temperature intolerance    Genitourinary:negative for abnormal menstrual periods, genital lesions, hot flashes, sexual problems and vaginal discharge Integument/breast: negative for breast lump, breast tenderness, nipple discharge and skin lesion(s)    Objective:       BP (!) 164/94   Pulse 97   Ht 5\' 8"  (1.727 m)   Wt 229 lb (103.9 kg)   LMP 06/12/2016 (Approximate)   BMI 34.82 kg/m  General:   alert  Skin:   no rash or abnormalities  Lungs:   clear to auscultation bilaterally  Heart:   regular rate and rhythm, S1, S2 normal, no murmur, click, rub or gallop  Breasts:   normal without suspicious masses, skin or nipple changes or axillary nodes  Abdomen:  normal findings: no organomegaly, soft, non-tender and no hernia  Pelvis:  External genitalia: normal general appearance Urinary system: urethral meatus normal and bladder without fullness, nontender Vaginal: normal without tenderness, induration or masses Cervix: normal appearance Adnexa: normal bimanual exam Uterus: anteverted and non-tender, normal size   Lab Review Urine pregnancy test Labs reviewed yes Radiologic studies reviewed yes  50% of 20 min visit spent on counseling and coordination of care.   Assessment:     1. Encounter for gynecological examination with Papanicolaou  smear of cervix Rx: - Cytology - PAP  2. HTN (hypertension), benign Rx: - carvedilol (COREG) 6.25 MG tablet; Take 1 tablet (6.25 mg total) by mouth 2 (two) times daily with a meal.  Dispense: 60 tablet; Refill: 11 - losartan (COZAAR) 50 MG tablet; Take 1 tablet (50 mg total) by mouth daily.  Dispense: 30 tablet; Refill: 11  3. Vaginal discharge Rx: - Cervicovaginal ancillary only  4. 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  Dispense: 30  tablet; Refill: 11   Plan:    Education reviewed: calcium supplements, depression evaluation, low fat, low cholesterol diet, safe sex/STD prevention, self breast exams, smoking cessation and weight bearing exercise. Contraception: condoms. Follow up in: 1 year.   Meds ordered this encounter  Medications  . losartan (COZAAR) 50 MG tablet    Sig: Take 1 tablet (50 mg total) by mouth daily.    Dispense:  30 tablet    Refill:  11  . carvedilol (COREG) 6.25 MG tablet    Sig: Take 1 tablet (6.25 mg total) by mouth 2 (two) times daily with a meal.    Dispense:  60 tablet    Refill:  11   No orders of the defined types were placed in this encounter.   Shelly Bombard MD

## 2017-02-04 NOTE — Progress Notes (Signed)
Presents for AEX/PAP. Complains of not been able to take her BP medications for the past 4 months because she is unable to pay for it (has no income).  She is having daily headaches, chest pains 8/10; light sensitivity, numbness and tingling at back of head x 2 months. Having discharge. Denies odor.  PHQ-9=19

## 2017-02-07 LAB — CERVICOVAGINAL ANCILLARY ONLY
Bacterial vaginitis: NEGATIVE
CANDIDA VAGINITIS: NEGATIVE
Chlamydia: NEGATIVE
Neisseria Gonorrhea: NEGATIVE
Trichomonas: NEGATIVE

## 2017-02-08 LAB — CYTOLOGY - PAP
DIAGNOSIS: NEGATIVE
HPV: NOT DETECTED

## 2017-03-25 ENCOUNTER — Other Ambulatory Visit: Payer: Self-pay | Admitting: Obstetrics

## 2017-03-25 DIAGNOSIS — Z1231 Encounter for screening mammogram for malignant neoplasm of breast: Secondary | ICD-10-CM

## 2017-04-19 ENCOUNTER — Ambulatory Visit
Admission: RE | Admit: 2017-04-19 | Discharge: 2017-04-19 | Disposition: A | Discharge status: Home / Self Care | Payer: Self-pay | Source: Ambulatory Visit | Attending: Obstetrics | Admitting: Obstetrics

## 2017-04-19 DIAGNOSIS — Z1231 Encounter for screening mammogram for malignant neoplasm of breast: Secondary | ICD-10-CM

## 2017-07-21 IMAGING — CR DG CHEST 2V
2 series · 2 of 2 positions shown · non-contrast
Comparison: 12/29/2015

CLINICAL DATA: Shortness of breath, COPD

EXAM:
CHEST  2 VIEW

[x chest ap]
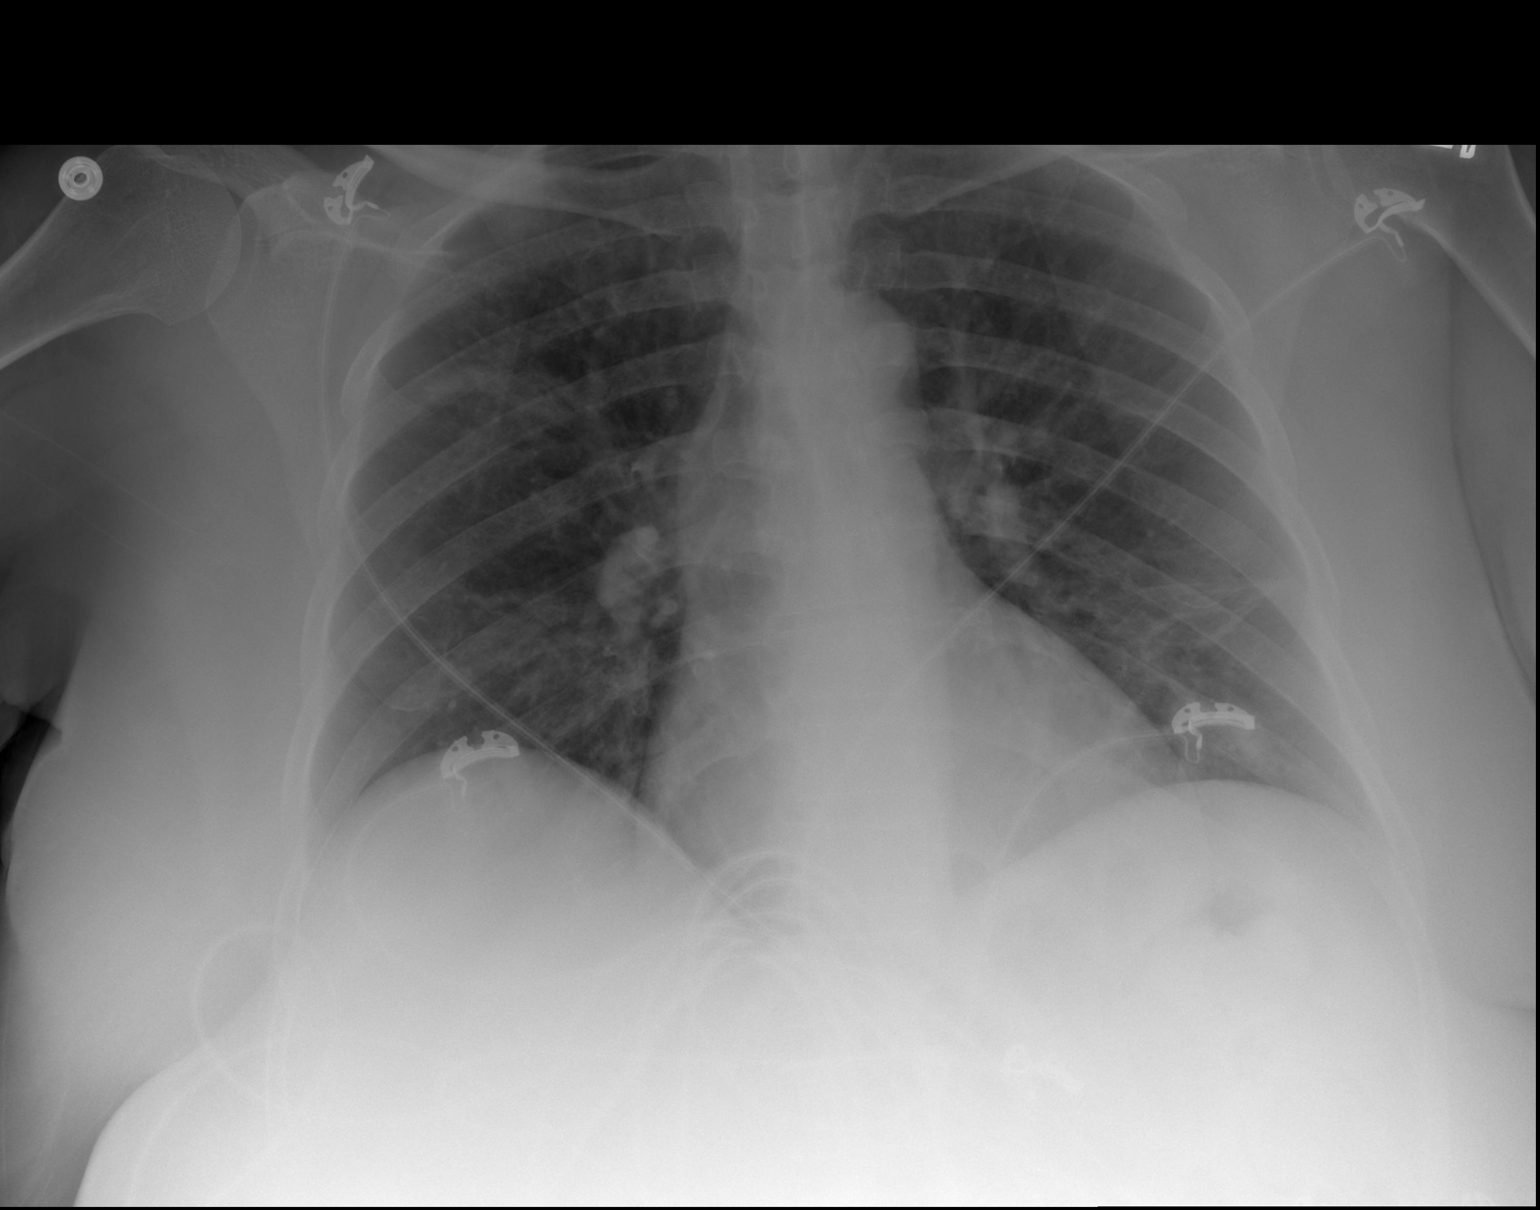

[w chest lat]
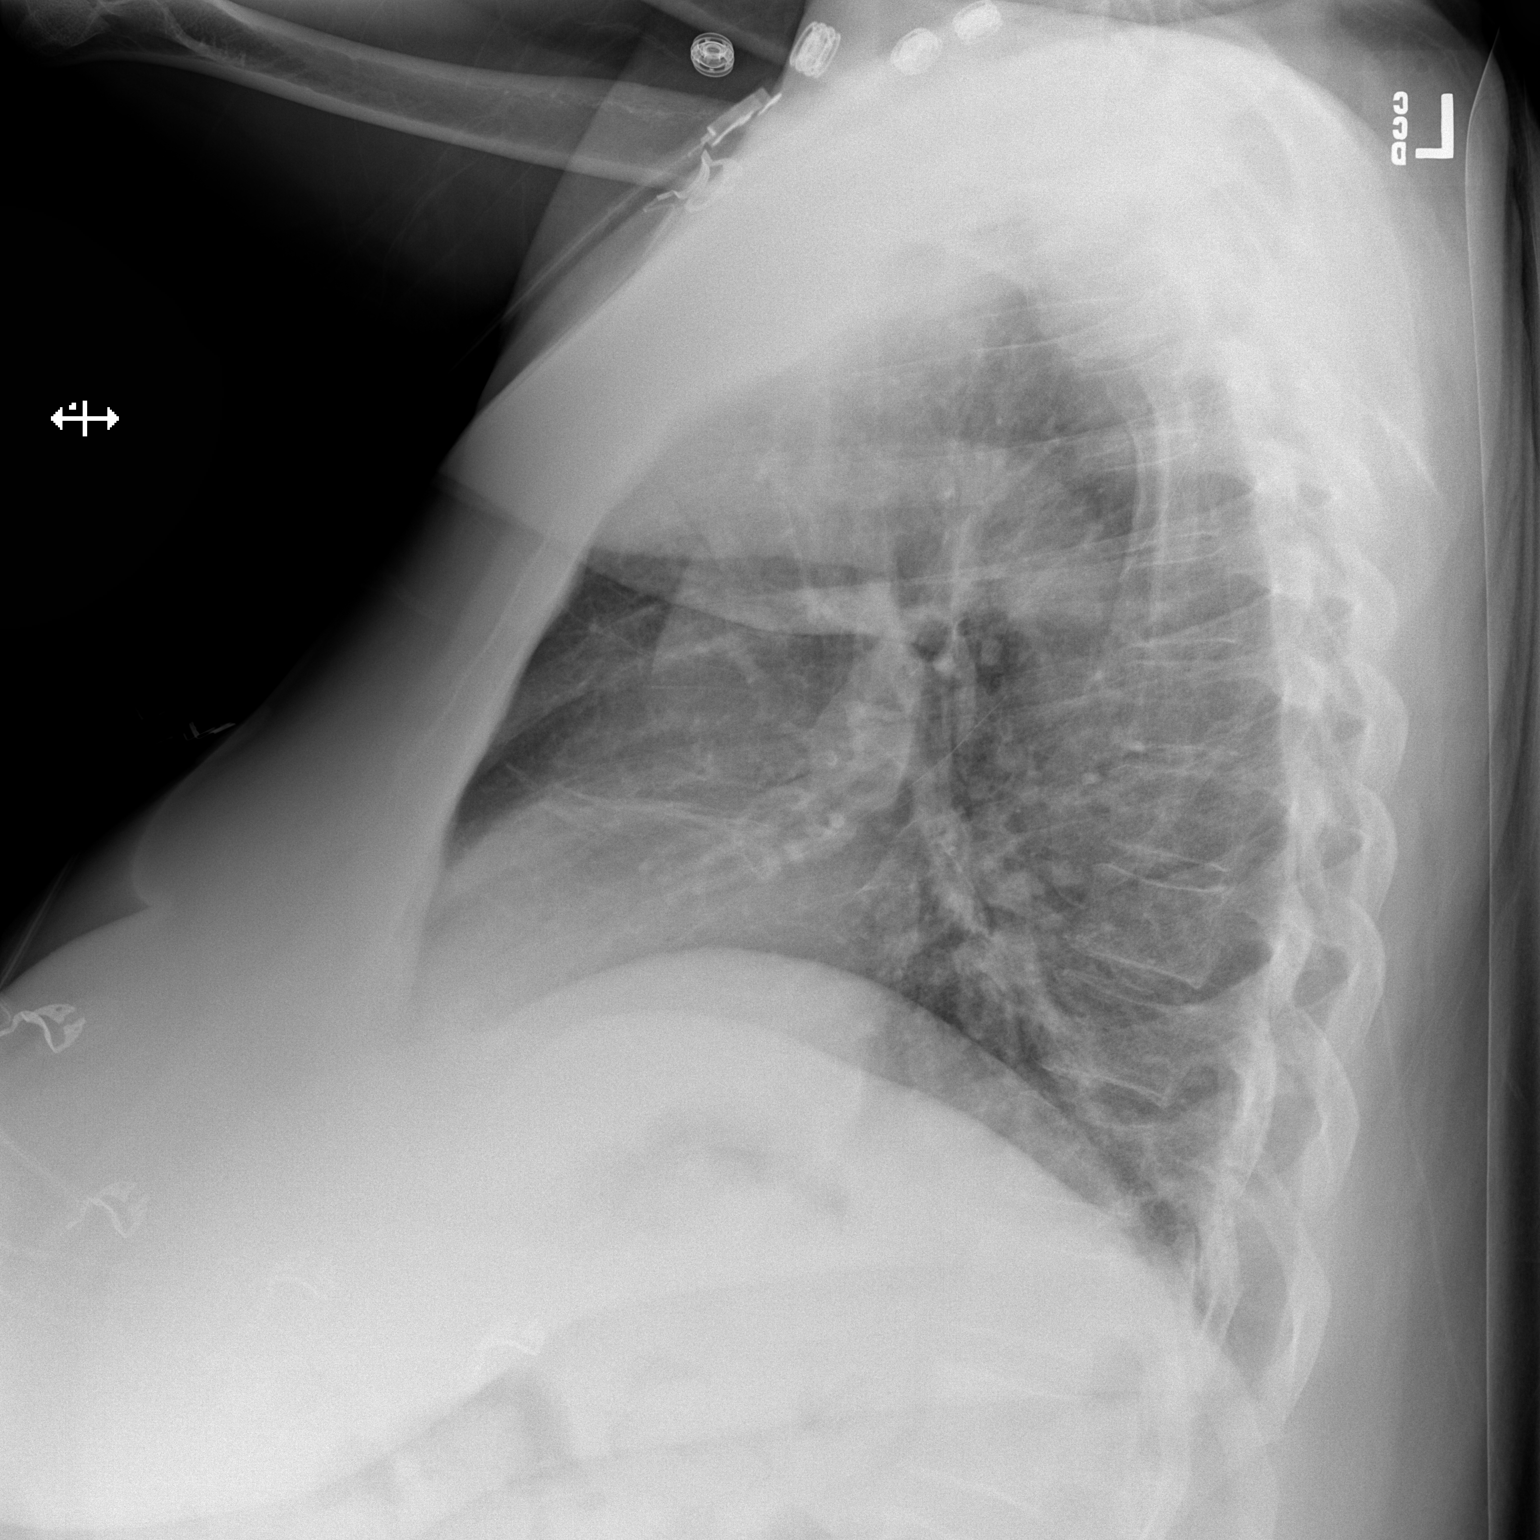

[2 of 2 positions shown; findings below may reference images not displayed]

FINDINGS: Cardiomediastinal silhouette is stable. No pulmonary edema. There is
linear atelectasis or scarring in lingula. Streaky atelectasis or
early infiltrate in right upper lobe perihilar region.
IMPRESSION: No pulmonary edema. Streaky atelectasis or early infiltrate in right
upper lobe suprahilar region.

## 2018-02-15 ENCOUNTER — Encounter: Payer: Self-pay | Admitting: Obstetrics

## 2018-02-15 ENCOUNTER — Ambulatory Visit: Payer: Medicaid Other | Admitting: Obstetrics

## 2018-02-15 ENCOUNTER — Other Ambulatory Visit (HOSPITAL_COMMUNITY)
Admission: RE | Admit: 2018-02-15 | Discharge: 2018-02-15 | Disposition: A | Discharge status: Home / Self Care | Payer: Medicaid Other | Source: Ambulatory Visit | Attending: Obstetrics | Admitting: Obstetrics

## 2018-02-15 VITALS — BP 146/90 | HR 81 | Ht 68.0 in | Wt 222.0 lb

## 2018-02-15 DIAGNOSIS — Z01419 Encounter for gynecological examination (general) (routine) without abnormal findings: Secondary | ICD-10-CM

## 2018-02-15 DIAGNOSIS — Z0000 Encounter for general adult medical examination without abnormal findings: Secondary | ICD-10-CM | POA: Diagnosis not present

## 2018-02-15 DIAGNOSIS — N898 Other specified noninflammatory disorders of vagina: Secondary | ICD-10-CM

## 2018-02-15 DIAGNOSIS — R102 Pelvic and perineal pain: Secondary | ICD-10-CM

## 2018-02-15 MED ORDER — TRAMADOL HCL 50 MG PO TABS: 100.0000 mg | ORAL_TABLET | Freq: Four times a day (QID) | ORAL | 0 refills | Status: DC | PRN

## 2018-02-15 NOTE — Progress Notes (Signed)
Presents for AEX/PAP. C/o abdominal pain 6/10 x years.   Next Mammo due 04/20/2019.  PHQ-9=19

## 2018-02-15 NOTE — Progress Notes (Signed)
Subjective:        Cassidy [name] is a 53 y.o. female here for a routine exam.  Current complaints: Pelvic pain.    Personal health questionnaire:  Is patient [name], have a family history of breast and/or ovarian cancer: no Is there a family history of uterine cancer diagnosed at age < 63, gastrointestinal cancer, urinary tract cancer, family member who is a [name]: no Is the patient overweight and hypertensive, family history of diabetes, personal history of gestational diabetes, preeclampsia or PCOS: no Is patient over 32, have PCOS,  family history of premature CHD under age 15, diabetes, smoke, have hypertension or peripheral artery disease:  no At any time, has a partner hit, kicked or otherwise hurt or frightened you?: no Over the past 2 weeks, have you felt down, depressed or hopeless?: no Over the past 2 weeks, have you felt little interest or pleasure in doing things?:no   Gynecologic History No LMP recorded. (Menstrual status: Perimenopausal). Contraception: post menopausal status Last Pap: 2019. Results were: normal Last mammogram: 2017. Results were: normal  Obstetric History OB History  Gravida Para Term Preterm AB Living  1 1 1     1   SAB TAB Ectopic Multiple Live Births               # Outcome Date GA Lbr Len/2nd Weight Sex Delivery Anes PTL Lv  1 Term             Past Medical History:  Diagnosis Date  . Anemia   . Atherosclerotic heart disease of native coronary artery with angina pectoris ([place]) 05/2012, 10/2013   a.  s/p PCTA to dRCA and ostial RPAV-PLA vessel + PDA branch (05/2012)  b. [place] s/p DES to mLAD - Resolute DES 3.0 x 22 (3.60mm -->3.3 mm)  . Bipolar depression ([place])   . Breast abscess    a. right side.   Marland Kitchen COPD (chronic obstructive pulmonary disease) ([place])   . Depression   . Diabetic peripheral neuropathy ([place])   . Fibromyalgia   . Genital warts   . GERD (gastroesophageal reflux disease)   . HLD  (hyperlipidemia)   . Hypertension   . Pain in limb    a. LE VENOUS DUPLEX, 02/05/2009 - no evidence of deep vein thrombosis, Baker's cyst  . PTSD (post-traumatic stress disorder)   . Sickle cell trait ([place])   . Tobacco abuse   . Tooth caries   . Type II diabetes mellitus ([place])     Past Surgical History:  Procedure Laterality Date  . BLADDER SURGERY  1980   "TVT"  . BREAST EXCISIONAL BIOPSY    . BREAST SURGERY Right    I&D for multiple abscesses  . cutting balloon    . EYE SURGERY Left    laser surgery  . INCISE AND DRAIN ABCESS  ; 04/26/11; 08/13/11   right breast  . INCISION AND DRAINAGE ABSCESS Right 05/09/2012   Procedure: INCISION AND DRAINAGE RIGHT BREAST ABSCESS;  Surgeon: Imogene Burn. Georgette Dover, MD;  Location: [place];  Service: General;  Laterality: Right;  . INCISION AND DRAINAGE ABSCESS Right 02/08/2014   Procedure: INCISION AND DRAINAGE RIGHT BREAST ABSCESS;  Surgeon: Donnie Mesa, MD;  Location: [place];  Service: General;  Laterality: Right;  . INCISION AND DRAINAGE ABSCESS Right 06/14/2014   Procedure: INCISION AND DRAINAGE RIGHT BREAST ABSCESS;  Surgeon: Donnie Mesa, MD;  Location: [place];  Service: General;  Laterality: Right;  . IRRIGATION AND  DEBRIDEMENT ABSCESS  12/19/2010   Procedure: IRRIGATION AND DEBRIDEMENT ABSCESS;  Surgeon: Rolm Bookbinder, MD;  Location: [place];  Service: General;  Laterality: Right;  . IRRIGATION AND DEBRIDEMENT ABSCESS  08/13/2011   Procedure: MINOR INCISION AND DRAINAGE OF ABSCESS;  Surgeon: Gwenyth Ober, MD;  Location: [place];  Service: General;  Laterality: Right;  Right Breast   . IRRIGATION AND DEBRIDEMENT ABSCESS  11/17/2011   Procedure: IRRIGATION AND DEBRIDEMENT ABSCESS;  Surgeon: Imogene Burn. Georgette Dover, MD;  Location: [place] [facility];  Service: General;  Laterality: Right;  irrigation and debridement right recurrent breast abscess  . LARYNX SURGERY    . LEFT HEART CATHETERIZATION WITH CORONARY ANGIOGRAM N/A 05/29/2012   Procedure: LEFT HEART CATHETERIZATION  WITH CORONARY ANGIOGRAM & PTCA;  Surgeon: Troy Sine, MD;  Location[facility] C[facility] LAB;  Service: Cardiovascular; Bifurcation dRCA-RPAD/PLA & rPDA  PTCA.  Marland Kitchen LEFT HEART CATHETERIZATION WITH CORONARY ANGIOGRAM N/A 11/08/2013   Procedure: LEFT HEART CATHETERIZATION WITH CORONARY ANGIOGRAM and Coronary Stent Intervention;  Surgeon: Leonie Man, MD;  Location: [facility] CATH LAB;  Service: Cardiovascular; mLAD 80% (FFR 0.73) - resolute DES 3.0 x 22 mm (postdilated to 3.3 mm->3.5 mm)  . NM MYOVIEW LTD  01/26/2009   Normal study, no evidence of ischemia, EF 67%  . ployp removed from voice box 03/30/12    . REFRACTIVE SURGERY  ~ 2010   right  . TONSILLECTOMY  1988     Current Outpatient Medications:  .  gabapentin (NEURONTIN) 600 MG tablet, Take 600 mg by mouth 3 (three) times daily., Disp: , Rfl: 0 .  ACCU-CHEK AVIVA PLUS test strip, TEST BLOOD SUGAR THREE TIMES DAILY AS DIRECTED, Disp: 100 each, Rfl: 2 .  acyclovir (ZOVIRAX) 400 MG tablet, Take 1 tablet (400 mg total) by mouth 3 (three) times daily., Disp: 30 tablet, Rfl: 11 .  Albiglutide (TANZEUM) 50 MG PEN, Inject 50 mg weekly under skin, Disp: 12 each, Rfl: 1 .  albuterol (PROVENTIL HFA;VENTOLIN HFA) 108 (90 Base) MCG/ACT inhaler, Inhale 1-2 puffs into the lungs every 6 (six) hours as needed for wheezing or shortness of breath., Disp: 1 Inhaler, Rfl: 0 .  albuterol (PROVENTIL) (2.5 MG/3ML) 0.083% nebulizer solution, Take 2.5 mg by nebulization every 6 (six) hours as needed for wheezing or shortness of breath., Disp: , Rfl:  .  aspirin EC 81 MG tablet, Take 81 mg by mouth daily., Disp: , Rfl:  .  baclofen (LIORESAL) 10 MG tablet, Take 10 mg by mouth 3 (three) times daily., Disp: , Rfl: 0 .  benzonatate (TESSALON) 100 MG capsule, Take 1 capsule (100 mg total) by mouth every 8 (eight) hours. (Patient not taking: Reported on 02/15/2018), Disp: 21 capsule, Rfl: 0 .  budesonide-formoterol (SYMBICORT) 160-4.5 MCG/ACT inhaler, Inhale 2 puffs into the lungs 2 (two)  times daily., Disp: 1 Inhaler, Rfl: 3 .  buPROPion (WELLBUTRIN SR) 150 MG 12 hr tablet, Take 150 mg by mouth 2 (two) times daily., Disp: , Rfl: 5 .  carvedilol (COREG) 6.25 MG tablet, Take 1 tablet (6.25 mg total) by mouth 2 (two) times daily with a meal., Disp: 60 tablet, Rfl: 11 .  clindamycin (CLEOCIN T) 1 % lotion, Apply 1 application topically 2 (two) times daily. Apply to eczema on shoulders, Disp: , Rfl: 2 .  clopidogrel (PLAVIX) 75 MG tablet, Take 1 tablet (75 mg total) by mouth daily. (Patient not taking: Reported on 02/15/2018), Disp: 30 tablet, Rfl: 11 .  diclofenac sodium (VOLTAREN) 1 % GEL, Apply  1 application topically 3 (three) times daily as needed (pain)., Disp: , Rfl:  .  fluconazole (DIFLUCAN) 200 MG tablet, Take 1 tablet (200 mg total) by mouth every other day. (Patient not taking: Reported on 02/15/2018), Disp: 3 tablet, Rfl: 2 .  fluticasone (FLONASE) 50 MCG/ACT nasal spray, Place 1 spray into both nostrils daily as needed (congestion). , Disp: , Rfl: 5 .  furosemide (LASIX) 20 MG tablet, Take 20 mg by mouth daily., Disp: , Rfl: 5 .  HYDROcodone-homatropine (HYCODAN) 5-1.5 MG/5ML syrup, Take 5 mLs by mouth every 6 (six) hours as needed for cough., Disp: 80 mL, Rfl: 0 .  Insulin Detemir (LEVEMIR FLEXTOUCH) 100 UNIT/ML Pen, Inject 20 Units into the skin daily at 10 pm., Disp: 15 mL, Rfl: 2 .  Insulin Pen Needle 32G X 4 MM MISC, Use to inject insulin 4 times daily., Disp: 130 each, Rfl: 5 .  Insulin Syringe-Needle U-100 (INSULIN SYRINGE 1CC/31GX5/16") 31G X 5/16" 1 ML MISC, USE AS DIRECTED FOUR TIMES DAILY FOR INSULIN INJECTIONS., Disp: 200 each, Rfl: 0 .  ipratropium-albuterol (DUONEB) 0.5-2.5 (3) MG/3ML SOLN, Inhale 1 vial via nebulizer 2 times daily, Disp: , Rfl: 3 .  levofloxacin (LEVAQUIN) 750 MG tablet, Take 1 tablet (750 mg total) by mouth daily., Disp: 5 tablet, Rfl: 0 .  losartan (COZAAR) 50 MG tablet, Take 1 tablet (50 mg total) by mouth daily., Disp: 30 tablet, Rfl: 11 .   lubiprostone (AMITIZA) 24 MCG capsule, Take 24 mcg by mouth 2 (two) times daily with a meal., Disp: , Rfl:  .  metFORMIN (GLUCOPHAGE-XR) 500 MG 24 hr tablet, Take 2 tablets (1,000 mg total) by mouth daily with supper. (Patient taking differently: Take 1,000 mg by mouth daily with breakfast. ), Disp: 180 tablet, Rfl: 1 .  mometasone (ELOCON) 0.1 % cream, Apply 1 application topically 2 (two) times daily as needed (wound care (vaginal boils)). , Disp: , Rfl: 2 .  nitroGLYCERIN (NITROSTAT) 0.4 MG SL tablet, Place 1 tablet (0.4 mg total) under the tongue every 5 (five) minutes as needed for chest pain., Disp: 25 tablet, Rfl: 5 .  NOVOLOG FLEXPEN 100 UNIT/ML FlexPen, INJECT 10-12 UNITS INTO THE SKIN 3 TIMES DAILY WITH MEALS. PLUS SLIDING SCALE. (Patient taking differently: INJECT 12 -15 SUBCUTANEOUSLY BEFORE BREAKFAST AND LUNCH BASED ON CBG), Disp: 15 mL, Rfl: 5 .  nystatin (MYCOSTATIN) 100000 UNIT/ML suspension, Swish and swallow 69mls by mouth every 6 hours, Disp: , Rfl: 0 .  oxyCODONE-acetaminophen (PERCOCET) 10-325 MG tablet, Take 1 tablet by mouth every 4 (four) hours as needed for pain., Disp: , Rfl:  .  pantoprazole (PROTONIX) 40 MG tablet, Take 40 mg by mouth daily., Disp: , Rfl: 1 .  predniSONE (DELTASONE) 20 MG tablet, Take 2 tablets (40 mg total) by mouth daily with breakfast., Disp: 8 tablet, Rfl: 0 .  promethazine (PHENERGAN) 25 MG tablet, TAKE 1/2-1 TABLET BY MOUTH THREE TIMES PER DAY AS NEEDED FOR NAUSEA OR VOMITING, Disp: , Rfl: 0 .  simvastatin (ZOCOR) 40 MG tablet, TAKE 1 TABLET BY MOUTH ONCE A DAY AT 6:00 PM, Disp: 30 tablet, Rfl: 10 .  TANZEUM 30 MG PEN, Inject 30 mg as directed once a week. On Sun, Disp: , Rfl: 1 .  tiotropium (SPIRIVA HANDIHALER) 18 MCG inhalation capsule, Place 1 capsule (18 mcg total) into inhaler and inhale daily. (Patient not taking: Reported on 02/04/2017), Disp: 30 capsule, Rfl: 3 .  traMADol (ULTRAM) 50 MG tablet, Take 2 tablets (100 mg total) by  mouth every 6  (six) hours as needed for moderate pain or severe pain., Disp: 20 tablet, Rfl: 0 .  traZODone (DESYREL) 100 MG tablet, Take 50 mg by mouth at bedtime as needed for sleep. , Disp: , Rfl:  .  vitamin B-12 (CYANOCOBALAMIN) 1000 MCG tablet, Take 1,000 mcg by mouth daily., Disp: , Rfl:  Allergies  Allergen Reactions  . Amoxicillin Hives    Has patient had a PCN reaction causing immediate rash, facial/tongue/throat swelling, SOB or lightheadedness with hypotension: Yes Has patient had a PCN reaction causing severe rash involving mucus membranes or skin necrosis: No Has patient had a PCN reaction that required hospitalization No Has patient had a PCN reaction occurring within the last 10 years: Yes If all of the above answers are "NO", then may proceed with Cephalosporin use.    Social History   Tobacco Use  . Smoking status: Current Every Day Smoker    Packs/day: 0.25    Years: 30.00    Pack years: 7.50    Types: Cigarettes  . Smokeless tobacco: Never Used  . Tobacco comment: 08/14/11 "quit for 3 wk 05/2011; was smoking 1 ppd since age 67 before I quit; at least I've cut down to 1/4 ppd"  Substance Use Topics  . 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 COPD Mother   . Cancer Mother        lymphoma  . Heart disease Mother   . Stomach cancer Mother   . Hyperlipidemia Father   . Heart disease Father   . Hypertension Maternal Grandmother   . Diabetes Maternal Grandmother   . Cancer Maternal Aunt        breast, colon  . Crohn's disease Maternal Aunt   . Hypertension Brother   . Esophageal cancer Neg Hx   . Rectal cancer Neg Hx   . Liver cancer Neg Hx       Review of Systems  Constitutional: negative for fatigue and weight loss Respiratory: negative for cough and wheezing Cardiovascular: negative for chest pain, fatigue and  palpitations Gastrointestinal: negative for abdominal pain and change in bowel habits Musculoskeletal:negative for myalgias Neurological: negative for gait problems and tremors Behavioral/Psych: negative for abusive relationship, depression Endocrine: negative for temperature intolerance    Genitourinary:negative for abnormal menstrual periods, genital lesions, hot flashes, sexual problems and vaginal discharge.  Positive for chronic pelvic pain  Integument/breast: negative for breast lump, breast tenderness, nipple discharge and skin lesion(s)    Objective:       BP (!) 146/90   Pulse 81   Ht 5\' 8"  (1.727 m)   Wt 222 lb (100.7 kg)   BMI 33.75 kg/m  General:   alert  Skin:   no rash or abnormalities  Lungs:   clear to auscultation bilaterally  Heart:   regular rate and rhythm, S1, S2 normal, no murmur, click, rub or gallop  Breasts:   normal without suspicious masses, skin or nipple changes or axillary nodes  Abdomen:  normal findings: no organomegaly, soft, non-tender and no hernia  Pelvis:  External genitalia: normal general appearance Urinary system: urethral meatus normal and bladder without fullness, nontender Vaginal: normal without tenderness, induration or masses Cervix: normal appearance Adnexa: normal bimanual exam Uterus: anteverted and tender, normal size   Lab Review Urine pregnancy test Labs reviewed yes Radiologic studies  reviewed yes  50% of 20 min visit spent on counseling and coordination of care.   Assessment:     1. Encounter for routine gynecological examination with Papanicolaou smear of cervix Rx: - Cytology - PAP( [facility])  2. Vaginal discharge Rx: - Cervicovaginal ancillary only  3. Pelvic pain Rx: - traMADol (ULTRAM) 50 MG tablet; Take 2 tablets (100 mg total) by mouth every 6 (six) hours as needed for moderate pain or severe pain.  Dispense: 20 tablet; Refill: 0    Plan:    Education reviewed: calcium supplements, depression  evaluation, low fat, low cholesterol diet, safe sex/STD prevention, self breast exams and weight bearing exercise. Follow up in: 1 year.   Meds ordered this encounter  Medications  . traMADol (ULTRAM) 50 MG tablet    Sig: Take 2 tablets (100 mg total) by mouth every 6 (six) hours as needed for moderate pain or severe pain.    Dispense:  20 tablet    Refill:  0   No orders of the defined types were placed in this encounter.   Shelly Bombard MD 02-15-2018

## 2018-02-16 LAB — CERVICOVAGINAL ANCILLARY ONLY
CHLAMYDIA, DNA PROBE: NEGATIVE
NEISSERIA GONORRHEA: NEGATIVE

## 2018-02-17 LAB — CYTOLOGY - PAP
Diagnosis: NEGATIVE
HPV: NOT DETECTED

## 2019-03-05 ENCOUNTER — Other Ambulatory Visit: Payer: Self-pay

## 2019-03-05 ENCOUNTER — Ambulatory Visit: Payer: Medicaid Other | Admitting: Obstetrics

## 2019-03-05 ENCOUNTER — Other Ambulatory Visit (HOSPITAL_COMMUNITY)
Admission: RE | Admit: 2019-03-05 | Discharge: 2019-03-05 | Disposition: A | Discharge status: Home / Self Care | Payer: Medicaid Other | Source: Ambulatory Visit | Attending: Obstetrics | Admitting: Obstetrics

## 2019-03-05 ENCOUNTER — Encounter: Payer: Self-pay | Admitting: Obstetrics

## 2019-03-05 VITALS — BP 155/80 | HR 80 | Ht 68.0 in | Wt 219.2 lb

## 2019-03-05 DIAGNOSIS — N898 Other specified noninflammatory disorders of vagina: Secondary | ICD-10-CM | POA: Insufficient documentation

## 2019-03-05 DIAGNOSIS — Z0000 Encounter for general adult medical examination without abnormal findings: Secondary | ICD-10-CM | POA: Diagnosis not present

## 2019-03-05 DIAGNOSIS — R300 Dysuria: Secondary | ICD-10-CM | POA: Diagnosis not present

## 2019-03-05 DIAGNOSIS — A6000 Herpesviral infection of urogenital system, unspecified: Secondary | ICD-10-CM

## 2019-03-05 DIAGNOSIS — Z01419 Encounter for gynecological examination (general) (routine) without abnormal findings: Secondary | ICD-10-CM | POA: Insufficient documentation

## 2019-03-05 DIAGNOSIS — Z113 Encounter for screening for infections with a predominantly sexual mode of transmission: Secondary | ICD-10-CM

## 2019-03-05 DIAGNOSIS — Z1239 Encounter for other screening for malignant neoplasm of breast: Secondary | ICD-10-CM

## 2019-03-05 LAB — POCT URINALYSIS DIPSTICK
Bilirubin, UA: NEGATIVE
Blood, UA: NEGATIVE
Glucose, UA: NEGATIVE
Ketones, UA: NEGATIVE
Odor: POSITIVE
Protein, UA: NEGATIVE
Spec Grav, UA: 1.01 (ref 1.010–1.025)
Urobilinogen, UA: 0.2 E.U./dL
pH, UA: 5 (ref 5.0–8.0)

## 2019-03-05 MED ORDER — TINIDAZOLE 500 MG PO TABS: 1000.0000 mg | ORAL_TABLET | Freq: Every day | ORAL | 2 refills | Status: DC

## 2019-03-05 MED ORDER — VALACYCLOVIR HCL 1 G PO TABS
1000.0000 mg | ORAL_TABLET | Freq: Two times a day (BID) | ORAL | 11 refills | Status: DC
Start: 2019-03-05 — End: 2019-12-24

## 2019-03-05 MED ORDER — NITROFURANTOIN MONOHYD MACRO 100 MG PO CAPS: 100.0000 mg | ORAL_CAPSULE | Freq: Two times a day (BID) | ORAL | 2 refills | Status: DC

## 2019-03-05 NOTE — Progress Notes (Addendum)
Subjective:        Cassidy [name] is a 54 y.o. female here for a routine exam.  Current complaints: Malodorous deep yellow urine.  Denies frequency or burning, but does have a chronic suprapubic pain.  Personal health questionnaire:  Is patient [name], have a family history of breast and/or ovarian cancer: no Is there a family history of uterine cancer diagnosed at age < 14, gastrointestinal cancer, urinary tract cancer, family member who is a [name]: no Is the patient overweight and hypertensive, family history of diabetes, personal history of gestational diabetes, preeclampsia or PCOS: no Is patient over 6, have PCOS,  family history of premature CHD under age 41, diabetes, smoke, have hypertension or peripheral artery disease:  no At any time, has a partner hit, kicked or otherwise hurt or frightened you?: no Over the past 2 weeks, have you felt down, depressed or hopeless?: no Over the past 2 weeks, have you felt little interest or pleasure in doing things?:no   Gynecologic History No LMP recorded. (Menstrual status: Perimenopausal). Contraception: none Last Pap: 02-15-2018. Results were: normal Last mammogram: 2019. Results were: normal  Obstetric History OB History  Gravida Para Term Preterm AB Living  1 1 1     1   SAB TAB Ectopic Multiple Live Births          1    # Outcome Date GA Lbr Len/2nd Weight Sex Delivery Anes PTL Lv  1 Term         LIV    Past Medical History:  Diagnosis Date  . Anemia   . Atherosclerotic heart disease of native coronary artery with angina pectoris ([place]) 05/2012, 10/2013   a.  s/p PCTA to dRCA and ostial RPAV-PLA vessel + PDA branch (05/2012)  b. [place] s/p DES to mLAD - Resolute DES 3.0 x 22 (3.12mm -->3.3 mm)  . Bipolar depression ([place])   . Breast abscess    a. right side.   Marland Kitchen COPD (chronic obstructive pulmonary disease) ([place])   . Depression   . Diabetic peripheral neuropathy ([place])   . Fibromyalgia   .  Genital warts   . GERD (gastroesophageal reflux disease)   . HLD (hyperlipidemia)   . Hypertension   . Pain in limb    a. LE VENOUS DUPLEX, 02/05/2009 - no evidence of deep vein thrombosis, Baker's cyst  . PTSD (post-traumatic stress disorder)   . Sickle cell trait ([place])   . Tobacco abuse   . Tooth caries   . Type II diabetes mellitus ([place])     Past Surgical History:  Procedure Laterality Date  . BLADDER SURGERY  1980   "TVT"  . BREAST EXCISIONAL BIOPSY    . BREAST SURGERY Right    I&D for multiple abscesses  . cutting balloon    . EYE SURGERY Left    laser surgery  . INCISE AND DRAIN ABCESS  ; 04/26/11; 08/13/11   right breast  . INCISION AND DRAINAGE ABSCESS Right 05/09/2012   Procedure: INCISION AND DRAINAGE RIGHT BREAST ABSCESS;  Surgeon: Imogene Burn. Georgette Dover, MD;  Location: [facility];  Service: General;  Laterality: Right;  . INCISION AND DRAINAGE ABSCESS Right 02/08/2014   Procedure: INCISION AND DRAINAGE RIGHT BREAST ABSCESS;  Surgeon: Donnie Mesa, MD;  Location: [facility];  Service: General;  Laterality: Right;  . INCISION AND DRAINAGE ABSCESS Right 06/14/2014   Procedure: INCISION AND DRAINAGE RIGHT BREAST ABSCESS;  Surgeon: Donnie Mesa, MD;  Location: [facility];  Service: General;  Laterality: Right;  . IRRIGATION AND DEBRIDEMENT ABSCESS  12/19/2010   Procedure: IRRIGATION AND DEBRIDEMENT ABSCESS;  Surgeon: Rolm Bookbinder, MD;  Location: [facility];  Service: General;  Laterality: Right;  . IRRIGATION AND DEBRIDEMENT ABSCESS  08/13/2011   Procedure: MINOR INCISION AND DRAINAGE OF ABSCESS;  Surgeon: Gwenyth Ober, MD;  Location: [facility];  Service: General;  Laterality: Right;  Right Breast   . IRRIGATION AND DEBRIDEMENT ABSCESS  11/17/2011   Procedure: IRRIGATION AND DEBRIDEMENT ABSCESS;  Surgeon: Imogene Burn. Georgette Dover, MD;  Location: [facility];  Service: General;  Laterality: Right;  irrigation and debridement right recurrent breast abscess  . LARYNX SURGERY    . LEFT HEART CATHETERIZATION WITH CORONARY  ANGIOGRAM N/A 05/29/2012   Procedure: LEFT HEART CATHETERIZATION WITH CORONARY ANGIOGRAM & PTCA;  Surgeon: Troy Sine, MD;  Location: [facility] CATH LAB;  Service: Cardiovascular; Bifurcation dRCA-RPAD/PLA & rPDA  PTCA.  Marland Kitchen LEFT HEART CATHETERIZATION WITH CORONARY ANGIOGRAM N/A 11/08/2013   Procedure: LEFT HEART CATHETERIZATION WITH CORONARY ANGIOGRAM and Coronary Stent Intervention;  Surgeon: Leonie Man, MD;  Location: [facility] CATH LAB;  Service: Cardiovascular; mLAD 80% (FFR 0.73) - resolute DES 3.0 x 22 mm (postdilated to 3.3 mm->3.5 mm)  . NM MYOVIEW LTD  01/26/2009   Normal study, no evidence of ischemia, EF 67%  . ployp removed from voice box 03/30/12    . REFRACTIVE SURGERY  ~ 2010   right  . TONSILLECTOMY  1988     Current Outpatient Medications:  .  ACCU-CHEK AVIVA PLUS test strip, TEST BLOOD SUGAR THREE TIMES DAILY AS DIRECTED (Patient not taking: Reported on 03/05/2019), Disp: 100 each, Rfl: 2 .  acyclovir (ZOVIRAX) 400 MG tablet, Take 1 tablet (400 mg total) by mouth 3 (three) times daily. (Patient not taking: Reported on 03/05/2019), Disp: 30 tablet, Rfl: 11 .  Albiglutide (TANZEUM) 50 MG PEN, Inject 50 mg weekly under skin (Patient not taking: Reported on 03/05/2019), Disp: 12 each, Rfl: 1 .  albuterol (PROVENTIL HFA;VENTOLIN HFA) 108 (90 Base) MCG/ACT inhaler, Inhale 1-2 puffs into the lungs every 6 (six) hours as needed for wheezing or shortness of breath. (Patient not taking: Reported on 03/05/2019), Disp: 1 Inhaler, Rfl: 0 .  albuterol (PROVENTIL) (2.5 MG/3ML) 0.083% nebulizer solution, Take 2.5 mg by nebulization every 6 (six) hours as needed for wheezing or shortness of breath., Disp: , Rfl:  .  aspirin EC 81 MG tablet, Take 81 mg by mouth daily., Disp: , Rfl:  .  baclofen (LIORESAL) 10 MG tablet, Take 10 mg by mouth 3 (three) times daily., Disp: , Rfl: 0 .  benzonatate (TESSALON) 100 MG capsule, Take 1 capsule (100 mg total) by mouth every 8 (eight) hours. (Patient not taking: Reported  on 02/15/2018), Disp: 21 capsule, Rfl: 0 .  budesonide-formoterol (SYMBICORT) 160-4.5 MCG/ACT inhaler, Inhale 2 puffs into the lungs 2 (two) times daily. (Patient not taking: Reported on 03/05/2019), Disp: 1 Inhaler, Rfl: 3 .  buPROPion (WELLBUTRIN SR) 150 MG 12 hr tablet, Take 150 mg by mouth 2 (two) times daily., Disp: , Rfl: 5 .  carvedilol (COREG) 6.25 MG tablet, Take 1 tablet (6.25 mg total) by mouth 2 (two) times daily with a meal. (Patient not taking: Reported on 03/05/2019), Disp: 60 tablet, Rfl: 11 .  clindamycin (CLEOCIN T) 1 % lotion, Apply 1 application topically 2 (two) times daily. Apply to eczema on shoulders, Disp: , Rfl: 2 .  clopidogrel (PLAVIX) 75 MG tablet, Take 1 tablet (75  mg total) by mouth daily. (Patient not taking: Reported on 02/15/2018), Disp: 30 tablet, Rfl: 11 .  diclofenac sodium (VOLTAREN) 1 % GEL, Apply 1 application topically 3 (three) times daily as needed (pain)., Disp: , Rfl:  .  fluconazole (DIFLUCAN) 200 MG tablet, Take 1 tablet (200 mg total) by mouth every other day. (Patient not taking: Reported on 02/15/2018), Disp: 3 tablet, Rfl: 2 .  fluticasone (FLONASE) 50 MCG/ACT nasal spray, Place 1 spray into both nostrils daily as needed (congestion). , Disp: , Rfl: 5 .  furosemide (LASIX) 20 MG tablet, Take 20 mg by mouth daily., Disp: , Rfl: 5 .  gabapentin (NEURONTIN) 600 MG tablet, Take 600 mg by mouth 3 (three) times daily., Disp: , Rfl: 0 .  HYDROcodone-homatropine (HYCODAN) 5-1.5 MG/5ML syrup, Take 5 mLs by mouth every 6 (six) hours as needed for cough. (Patient not taking: Reported on 03/05/2019), Disp: 80 mL, Rfl: 0 .  Insulin Detemir (LEVEMIR FLEXTOUCH) 100 UNIT/ML Pen, Inject 20 Units into the skin daily at 10 pm. (Patient not taking: Reported on 03/05/2019), Disp: 15 mL, Rfl: 2 .  Insulin Pen Needle 32G X 4 MM MISC, Use to inject insulin 4 times daily. (Patient not taking: Reported on 03/05/2019), Disp: 130 each, Rfl: 5 .  Insulin Syringe-Needle U-100 (INSULIN SYRINGE  1CC/31GX5/16") 31G X 5/16" 1 ML MISC, USE AS DIRECTED FOUR TIMES DAILY FOR INSULIN INJECTIONS. (Patient not taking: Reported on 03/05/2019), Disp: 200 each, Rfl: 0 .  ipratropium-albuterol (DUONEB) 0.5-2.5 (3) MG/3ML SOLN, Inhale 1 vial via nebulizer 2 times daily, Disp: , Rfl: 3 .  levofloxacin (LEVAQUIN) 750 MG tablet, Take 1 tablet (750 mg total) by mouth daily. (Patient not taking: Reported on 03/05/2019), Disp: 5 tablet, Rfl: 0 .  losartan (COZAAR) 50 MG tablet, Take 1 tablet (50 mg total) by mouth daily. (Patient not taking: Reported on 03/05/2019), Disp: 30 tablet, Rfl: 11 .  lubiprostone (AMITIZA) 24 MCG capsule, Take 24 mcg by mouth 2 (two) times daily with a meal., Disp: , Rfl:  .  metFORMIN (GLUCOPHAGE-XR) 500 MG 24 hr tablet, Take 2 tablets (1,000 mg total) by mouth daily with supper. (Patient not taking: Reported on 03/05/2019), Disp: 180 tablet, Rfl: 1 .  mometasone (ELOCON) 0.1 % cream, Apply 1 application topically 2 (two) times daily as needed (wound care (vaginal boils)). , Disp: , Rfl: 2 .  nitrofurantoin, macrocrystal-monohydrate, (MACROBID) 100 MG capsule, Take 1 capsule (100 mg total) by mouth 2 (two) times daily. 1 po BID x 7days, Disp: 14 capsule, Rfl: 2 .  nitroGLYCERIN (NITROSTAT) 0.4 MG SL tablet, Place 1 tablet (0.4 mg total) under the tongue every 5 (five) minutes as needed for chest pain. (Patient not taking: Reported on 03/05/2019), Disp: 25 tablet, Rfl: 5 .  NOVOLOG FLEXPEN 100 UNIT/ML FlexPen, INJECT 10-12 UNITS INTO THE SKIN 3 TIMES DAILY WITH MEALS. PLUS SLIDING SCALE. (Patient not taking: Reported on 03/05/2019), Disp: 15 mL, Rfl: 5 .  nystatin (MYCOSTATIN) 100000 UNIT/ML suspension, Swish and swallow 68mls by mouth every 6 hours, Disp: , Rfl: 0 .  oxyCODONE-acetaminophen (PERCOCET) 10-325 MG tablet, Take 1 tablet by mouth every 4 (four) hours as needed for pain., Disp: , Rfl:  .  pantoprazole (PROTONIX) 40 MG tablet, Take 40 mg by mouth daily., Disp: , Rfl: 1 .  predniSONE  (DELTASONE) 20 MG tablet, Take 2 tablets (40 mg total) by mouth daily with breakfast. (Patient not taking: Reported on 03/05/2019), Disp: 8 tablet, Rfl: 0 .  promethazine (PHENERGAN)  25 MG tablet, TAKE 1/2-1 TABLET BY MOUTH THREE TIMES PER DAY AS NEEDED FOR NAUSEA OR VOMITING, Disp: , Rfl: 0 .  simvastatin (ZOCOR) 40 MG tablet, TAKE 1 TABLET BY MOUTH ONCE A DAY AT 6:00 PM (Patient not taking: Reported on 03/05/2019), Disp: 30 tablet, Rfl: 10 .  TANZEUM 30 MG PEN, Inject 30 mg as directed once a week. On Sun, Disp: , Rfl: 1 .  tinidazole (TINDAMAX) 500 MG tablet, Take 2 tablets (1,000 mg total) by mouth daily with breakfast., Disp: 10 tablet, Rfl: 2 .  tiotropium (SPIRIVA HANDIHALER) 18 MCG inhalation capsule, Place 1 capsule (18 mcg total) into inhaler and inhale daily. (Patient not taking: Reported on 02/04/2017), Disp: 30 capsule, Rfl: 3 .  traMADol (ULTRAM) 50 MG tablet, Take 2 tablets (100 mg total) by mouth every 6 (six) hours as needed for moderate pain or severe pain. (Patient not taking: Reported on 03/05/2019), Disp: 20 tablet, Rfl: 0 .  traZODone (DESYREL) 100 MG tablet, Take 50 mg by mouth at bedtime as needed for sleep. , Disp: , Rfl:  .  valACYclovir (VALTREX) 1000 MG tablet, Take 1 tablet (1,000 mg total) by mouth 2 (two) times daily. Take for 3 days prn each outbreak., Disp: 30 tablet, Rfl: 11 .  vitamin B-12 (CYANOCOBALAMIN) 1000 MCG tablet, Take 1,000 mcg by mouth daily., Disp: , Rfl:  Allergies  Allergen Reactions  . Amoxicillin Hives    Has patient had a PCN reaction causing immediate rash, facial/tongue/throat swelling, SOB or lightheadedness with hypotension: Yes Has patient had a PCN reaction causing severe rash involving mucus membranes or skin necrosis: No Has patient had a PCN reaction that required hospitalization No Has patient had a PCN reaction occurring within the last 10 years: Yes If all of the above answers are "NO", then may proceed with Cephalosporin use.    Social  History   Tobacco Use  . Smoking status: Current Every Day Smoker    Packs/day: 0.25    Years: 30.00    Pack years: 7.50    Types: Cigarettes  . Smokeless tobacco: Never Used  . Tobacco comment: 08/14/11 "quit for 3 wk 05/2011; was smoking 1 ppd since age 82 before I quit; at least I've cut down to 1/4 ppd"  Substance Use Topics  . 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 COPD Mother   . Cancer Mother        lymphoma  . Heart disease Mother   . Stomach cancer Mother   . Hyperlipidemia Father   . Heart disease Father   . Hypertension Maternal Grandmother   . Diabetes Maternal Grandmother   . Cancer Maternal Aunt        breast, colon  . Crohn's disease Maternal Aunt   . Hypertension Brother   . Esophageal cancer Neg Hx   . Rectal cancer Neg Hx   . Liver cancer Neg Hx       Review of Systems  Constitutional: negative for fatigue and weight loss Respiratory: negative for cough and wheezing Cardiovascular: negative for chest pain, fatigue and palpitations Gastrointestinal: negative for abdominal pain and change in bowel habits Musculoskeletal:negative for myalgias Neurological: negative for gait problems and tremors Behavioral/Psych: negative for abusive relationship, depression Endocrine: negative for temperature intolerance    Genitourinary:negative for abnormal menstrual periods, genital lesions, hot flashes,  sexual problems and vaginal discharge Integument/breast: negative for breast lump, breast tenderness, nipple discharge and skin lesion(s)    Objective:       BP (!) 155/80   Pulse 80   Ht 5\' 8"  (1.727 m)   Wt 219 lb 3.2 oz (99.4 kg)   BMI 33.33 kg/m  General:   alert  Skin:   no rash or abnormalities  Lungs:   clear to auscultation bilaterally  Heart:   regular rate and rhythm, S1, S2 normal, no murmur, click,  rub or gallop  Breasts:   normal without suspicious masses, skin or nipple changes or axillary nodes  Abdomen:  normal findings: no organomegaly, soft, non-tender and no hernia  Pelvis:  External genitalia: normal general appearance Urinary system: urethral meatus normal and bladder without fullness, nontender Vaginal: normal without tenderness, induration or masses Cervix: normal appearance Adnexa: normal bimanual exam Uterus: anteverted and non-tender, normal size   Lab Review Urine pregnancy test Labs reviewed yes Radiologic studies reviewed yes  50% of 25 min visit spent on counseling and coordination of care.   Assessment:     1. Encounter for routine gynecological examination with Papanicolaou smear of cervix Rx: - Cytology - PAP( [facility])  2. Vaginal discharge Rx: - Cervicovaginal ancillary only( [facility]) - tinidazole (TINDAMAX) 500 MG tablet; Take 2 tablets (1,000 mg total) by mouth daily with breakfast.  Dispense: 10 tablet; Refill: 2  3. Screen for STD (sexually transmitted disease) Rx: - HIV antibody (with reflex) - RPR - Hepatitis B Surface AntiGEN - Hepatitis C Antibody  4. Dysuria Rx: - nitrofurantoin, macrocrystal-monohydrate, (MACROBID) 100 MG capsule; Take 1 capsule (100 mg total) by mouth 2 (two) times daily. 1 po BID x 7days  Dispense: 14 capsule; Refill: 2 - Urine Culture  5. Screening breast examination Rx: - MM Digital Screening; Future  6. Herpes simplex infection of genitourinary system Rx: - valACYclovir (VALTREX) 1000 MG tablet; Take 1 tablet (1,000 mg total) by mouth 2 (two) times daily. Take for 3 days prn each outbreak.  Dispense: 30 tablet; Refill: 11    Plan:    Education reviewed: calcium supplements, depression evaluation, low fat, low cholesterol diet, safe sex/STD prevention, self breast exams, smoking cessation and weight bearing exercise. Mammogram ordered. Follow up in: 1 year.   Meds ordered this encounter   Medications  . tinidazole (TINDAMAX) 500 MG tablet    Sig: Take 2 tablets (1,000 mg total) by mouth daily with breakfast.    Dispense:  10 tablet    Refill:  2  . nitrofurantoin, macrocrystal-monohydrate, (MACROBID) 100 MG capsule    Sig: Take 1 capsule (100 mg total) by mouth 2 (two) times daily. 1 po BID x 7days    Dispense:  14 capsule    Refill:  2  . valACYclovir (VALTREX) 1000 MG tablet    Sig: Take 1 tablet (1,000 mg total) by mouth 2 (two) times daily. Take for 3 days prn each outbreak.    Dispense:  30 tablet    Refill:  11   Orders Placed This Encounter  Procedures  . Urine Culture  . MM Digital Screening    Standing Status:   Future    Standing Expiration Date:   05/02/2020    Order Specific Question:   Reason for Exam (SYMPTOM  OR DIAGNOSIS REQUIRED)    Answer:   Screening    Order Specific Question:   Is the patient pregnant?    Answer:   No  Order Specific Question:   Preferred imaging location?    Answer:   [facility]  . HIV antibody (with reflex)  . RPR  . Hepatitis B Surface AntiGEN  . Hepatitis C Antibody    Shelly Bombard, MD 03/05/2019 1:19 PM

## 2019-03-05 NOTE — Addendum Note (Signed)
Addended by: Tristan Schroeder D on: 03/05/2019 02:53 PM   Modules accepted: Orders

## 2019-03-05 NOTE — Progress Notes (Signed)
Pt is in the office for annual, last pap 02-15-18.  Last mammogram 04-19-2017 Pt complains of vaginal discharge, odor and irritation.

## 2019-03-07 LAB — CYTOLOGY - PAP
Comment: NEGATIVE
Diagnosis: NEGATIVE
High risk HPV: NEGATIVE

## 2019-03-07 LAB — RPR, QUANT+TP ABS (REFLEX)
Rapid Plasma Reagin, Quant: 1:1 {titer} — ABNORMAL HIGH
T Pallidum Abs: NONREACTIVE

## 2019-03-07 LAB — URINE CULTURE

## 2019-03-07 LAB — CERVICOVAGINAL ANCILLARY ONLY
Bacterial Vaginitis (gardnerella): POSITIVE — AB
Candida Glabrata: NEGATIVE
Candida Vaginitis: NEGATIVE
Chlamydia: NEGATIVE
Comment: NEGATIVE
Comment: NEGATIVE
Comment: NEGATIVE
Comment: NEGATIVE
Comment: NEGATIVE
Comment: NORMAL
Neisseria Gonorrhea: NEGATIVE
Trichomonas: NEGATIVE

## 2019-03-07 LAB — RPR: RPR Ser Ql: REACTIVE — AB

## 2019-03-07 LAB — HIV ANTIBODY (ROUTINE TESTING W REFLEX): HIV Screen 4th Generation wRfx: NONREACTIVE

## 2019-03-07 LAB — HEPATITIS B SURFACE ANTIGEN: Hepatitis B Surface Ag: NEGATIVE

## 2019-03-07 LAB — HEPATITIS C ANTIBODY: Hep C Virus Ab: <0.1 s/co ratio (ref 0.0–0.9)

## 2019-03-08 ENCOUNTER — Other Ambulatory Visit: Payer: Self-pay | Admitting: Obstetrics

## 2019-04-03 ENCOUNTER — Other Ambulatory Visit: Payer: Self-pay

## 2019-04-03 ENCOUNTER — Ambulatory Visit
Admission: RE | Admit: 2019-04-03 | Discharge: 2019-04-03 | Disposition: A | Discharge status: Home / Self Care | Payer: Medicaid Other | Source: Ambulatory Visit | Attending: Obstetrics | Admitting: Obstetrics

## 2019-04-03 DIAGNOSIS — Z1239 Encounter for other screening for malignant neoplasm of breast: Secondary | ICD-10-CM

## 2019-05-09 ENCOUNTER — Other Ambulatory Visit: Payer: Self-pay | Admitting: Obstetrics

## 2019-05-09 DIAGNOSIS — R300 Dysuria: Secondary | ICD-10-CM

## 2019-06-21 ENCOUNTER — Ambulatory Visit: Payer: Medicaid Other | Admitting: Obstetrics

## 2019-06-21 ENCOUNTER — Other Ambulatory Visit: Payer: Self-pay

## 2019-06-21 ENCOUNTER — Other Ambulatory Visit (HOSPITAL_COMMUNITY)
Admission: RE | Admit: 2019-06-21 | Discharge: 2019-06-21 | Disposition: A | Discharge status: Home / Self Care | Payer: Medicaid Other | Source: Ambulatory Visit | Attending: Obstetrics | Admitting: Obstetrics

## 2019-06-21 ENCOUNTER — Encounter: Payer: Self-pay | Admitting: Obstetrics

## 2019-06-21 VITALS — BP 174/84 | HR 85 | Wt 219.0 lb

## 2019-06-21 DIAGNOSIS — N898 Other specified noninflammatory disorders of vagina: Secondary | ICD-10-CM | POA: Insufficient documentation

## 2019-06-21 DIAGNOSIS — R300 Dysuria: Secondary | ICD-10-CM

## 2019-06-21 DIAGNOSIS — E1121 Type 2 diabetes mellitus with diabetic nephropathy: Secondary | ICD-10-CM

## 2019-06-21 DIAGNOSIS — J441 Chronic obstructive pulmonary disease with (acute) exacerbation: Secondary | ICD-10-CM

## 2019-06-21 DIAGNOSIS — E139 Other specified diabetes mellitus without complications: Secondary | ICD-10-CM

## 2019-06-21 DIAGNOSIS — B373 Candidiasis of vulva and vagina: Secondary | ICD-10-CM | POA: Diagnosis not present

## 2019-06-21 DIAGNOSIS — D689 Coagulation defect, unspecified: Secondary | ICD-10-CM | POA: Insufficient documentation

## 2019-06-21 DIAGNOSIS — I25118 Atherosclerotic heart disease of native coronary artery with other forms of angina pectoris: Secondary | ICD-10-CM | POA: Insufficient documentation

## 2019-06-21 DIAGNOSIS — F319 Bipolar disorder, unspecified: Secondary | ICD-10-CM

## 2019-06-21 DIAGNOSIS — B3731 Acute candidiasis of vulva and vagina: Secondary | ICD-10-CM

## 2019-06-21 DIAGNOSIS — Z113 Encounter for screening for infections with a predominantly sexual mode of transmission: Secondary | ICD-10-CM | POA: Diagnosis not present

## 2019-06-21 LAB — POCT URINALYSIS DIPSTICK
Bilirubin, UA: NEGATIVE
Blood, UA: NEGATIVE
Glucose, UA: NEGATIVE
Ketones, UA: NEGATIVE
Nitrite, UA: NEGATIVE
Protein, UA: NEGATIVE
Spec Grav, UA: 1.01 (ref 1.010–1.025)
Urobilinogen, UA: 0.2 E.U./dL
pH, UA: 5 (ref 5.0–8.0)

## 2019-06-21 MED ORDER — NITROFURANTOIN MONOHYD MACRO 100 MG PO CAPS: ORAL_CAPSULE | ORAL | 2 refills | Status: DC

## 2019-06-21 MED ORDER — FLUCONAZOLE 200 MG PO TABS: 200.0000 mg | ORAL_TABLET | ORAL | 2 refills | Status: DC

## 2019-06-21 NOTE — Progress Notes (Signed)
Pt is in the office for GYN visit, possible uti. Pt reports burning with urination, pelvic pain, vaginal itching/irritation, wants std testing.

## 2019-06-21 NOTE — Progress Notes (Signed)
Patient ID: Cassidy [name], female   DOB: 03-27-1965, 54 y.o.   MRN: [id number]  Chief Complaint  Patient presents with   GYN    HPI Cassidy [name] is a 54 y.o. female.  Burning and pain with urination. HPI  Past Medical History:  Diagnosis Date   Anemia    Atherosclerotic heart disease of native coronary artery with angina pectoris ([place]) 05/2012, 10/2013   a.  s/p PCTA to dRCA and ostial RPAV-PLA vessel + PDA branch (05/2012)  b. [place] s/p DES to mLAD - Resolute DES 3.0 x 22 (3.61mm -->3.3 mm)   Bipolar depression (HCC)    Breast abscess    a. right side.    COPD (chronic obstructive pulmonary disease) (HCC)    Depression    Diabetic peripheral neuropathy (HCC)    Fibromyalgia    Genital warts    GERD (gastroesophageal reflux disease)    HLD (hyperlipidemia)    Hypertension    Pain in limb    a. LE VENOUS DUPLEX, 02/05/2009 - no evidence of deep vein thrombosis, Baker's cyst   PTSD (post-traumatic stress disorder)    Sickle cell trait ([place])    Tobacco abuse    Tooth caries    Type II diabetes mellitus ([place])     Past Surgical History:  Procedure Laterality Date   BLADDER SURGERY  1980   "TVT"   BREAST EXCISIONAL BIOPSY     BREAST SURGERY Right    I&D for multiple abscesses   cutting balloon     EYE SURGERY Left    laser surgery   INCISE AND DRAIN ABCESS  ; 04/26/11; 08/13/11   right breast   INCISION AND DRAINAGE ABSCESS Right 05/09/2012   Procedure: INCISION AND DRAINAGE RIGHT BREAST ABSCESS;  Surgeon: Imogene Burn. Georgette Dover, MD;  Location: [facility];  Service: General;  Laterality: Right;   INCISION AND DRAINAGE ABSCESS Right 02/08/2014   Procedure: INCISION AND DRAINAGE RIGHT BREAST ABSCESS;  Surgeon: Donnie Mesa, MD;  Location: [place];  Service: General;  Laterality: Right;   INCISION AND DRAINAGE ABSCESS Right 06/14/2014   Procedure: INCISION AND DRAINAGE RIGHT BREAST ABSCESS;  Surgeon: Donnie Mesa, MD;  Location: [place];  Service: General;   Laterality: Right;   IRRIGATION AND DEBRIDEMENT ABSCESS  12/19/2010   Procedure: IRRIGATION AND DEBRIDEMENT ABSCESS;  Surgeon: Rolm Bookbinder, MD;  Location: [place];  Service: General;  Laterality: Right;   IRRIGATION AND DEBRIDEMENT ABSCESS  08/13/2011   Procedure: MINOR INCISION AND DRAINAGE OF ABSCESS;  Surgeon: Gwenyth Ober, MD;  Location: [place];  Service: General;  Laterality: Right;  Right Breast    IRRIGATION AND DEBRIDEMENT ABSCESS  11/17/2011   Procedure: IRRIGATION AND DEBRIDEMENT ABSCESS;  Surgeon: Imogene Burn. Georgette Dover, MD;  Location: [place];  Service: General;  Laterality: Right;  irrigation and debridement right recurrent breast abscess   LARYNX SURGERY     LEFT HEART CATHETERIZATION WITH CORONARY ANGIOGRAM N/A 05/29/2012   Procedure: LEFT HEART CATHETERIZATION WITH CORONARY ANGIOGRAM & PTCA;  Surgeon: Troy Sine, MD;  Location: [facility] CATH LAB;  Service: Cardiovascular; Bifurcation dRCA-RPAD/PLA & rPDA  PTCA.   LEFT HEART CATHETERIZATION WITH CORONARY ANGIOGRAM N/A 11/08/2013   Procedure: LEFT HEART CATHETERIZATION WITH CORONARY ANGIOGRAM and Coronary Stent Intervention;  Surgeon: Leonie Man, MD;  Location: [facility] CATH LAB;  Service: Cardiovascular; mLAD 80% (FFR 0.73) - resolute DES 3.0 x 22 mm (postdilated to 3.3 mm->3.5 mm)   NM MYOVIEW LTD  01/26/2009  Normal study, no evidence of ischemia, EF 67%   ployp removed from voice box 03/30/12     REFRACTIVE SURGERY  ~ 2010   right   TONSILLECTOMY  1988    Family History  Problem Relation Age of Onset   COPD Mother    Cancer Mother        lymphoma   Heart disease Mother    Stomach cancer Mother    Hyperlipidemia Father    Heart disease Father    Hypertension Maternal Grandmother    Diabetes Maternal Grandmother    Cancer Maternal Aunt        breast, colon   Crohn's disease Maternal Aunt    Hypertension Brother    Esophageal cancer Neg Hx    Rectal cancer Neg Hx    Liver cancer Neg Hx     Social  History Social History   Tobacco Use   Smoking status: Current Every Day Smoker    Packs/day: 0.25    Years: 30.00    Pack years: 7.50    Types: Cigarettes   Smokeless tobacco: Never Used   Tobacco comment: 08/14/11 "quit for 3 wk 05/2011; was smoking 1 ppd since age 31 before I quit; at least I've cut down to 1/4 ppd"  Substance Use Topics   Alcohol use: No    Alcohol/week: 0.0 standard drinks    Comment: recovering addict; 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, marijuana, crack cocaine)   Drug use: No    Types: Marijuana, "Crack" cocaine    Comment: 08/14/11 'quit everything in 2005"    Allergies  Allergen Reactions   Amoxicillin Hives    Has patient had a PCN reaction causing immediate rash, facial/tongue/throat swelling, SOB or lightheadedness with hypotension: Yes Has patient had a PCN reaction causing severe rash involving mucus membranes or skin necrosis: No Has patient had a PCN reaction that required hospitalization No Has patient had a PCN reaction occurring within the last 10 years: Yes If all of the above answers are "NO", then may proceed with Cephalosporin use.    Current Outpatient Medications  Medication Sig Dispense Refill   ACCU-CHEK AVIVA PLUS test strip TEST BLOOD SUGAR THREE TIMES DAILY AS DIRECTED (Patient not taking: Reported on 03/05/2019) 100 each 2   acyclovir (ZOVIRAX) 400 MG tablet Take 1 tablet (400 mg total) by mouth 3 (three) times daily. (Patient not taking: Reported on 03/05/2019) 30 tablet 11   Albiglutide (TANZEUM) 50 MG PEN Inject 50 mg weekly under skin (Patient not taking: Reported on 03/05/2019) 12 each 1   albuterol (PROVENTIL HFA;VENTOLIN HFA) 108 (90 Base) MCG/ACT inhaler Inhale 1-2 puffs into the lungs every 6 (six) hours as needed for wheezing or shortness of breath. (Patient not taking: Reported on 03/05/2019) 1 Inhaler 0   albuterol (PROVENTIL) (2.5 MG/3ML) 0.083% nebulizer solution Take 2.5 mg by nebulization every 6 (six) hours as needed for  wheezing or shortness of breath.     aspirin EC 81 MG tablet Take 81 mg by mouth daily.     baclofen (LIORESAL) 10 MG tablet Take 10 mg by mouth 3 (three) times daily.  0   benzonatate (TESSALON) 100 MG capsule Take 1 capsule (100 mg total) by mouth every 8 (eight) hours. (Patient not taking: Reported on 02/15/2018) 21 capsule 0   budesonide-formoterol (SYMBICORT) 160-4.5 MCG/ACT inhaler Inhale 2 puffs into the lungs 2 (two) times daily. (Patient not taking: Reported on 03/05/2019) 1 Inhaler 3   buPROPion (WELLBUTRIN SR) 150 MG 12 hr tablet  Take 150 mg by mouth 2 (two) times daily.  5   carvedilol (COREG) 6.25 MG tablet Take 1 tablet (6.25 mg total) by mouth 2 (two) times daily with a meal. (Patient not taking: Reported on 03/05/2019) 60 tablet 11   clindamycin (CLEOCIN T) 1 % lotion Apply 1 application topically 2 (two) times daily. Apply to eczema on shoulders  2   clopidogrel (PLAVIX) 75 MG tablet Take 1 tablet (75 mg total) by mouth daily. (Patient not taking: Reported on 02/15/2018) 30 tablet 11   diclofenac sodium (VOLTAREN) 1 % GEL Apply 1 application topically 3 (three) times daily as needed (pain).     fluconazole (DIFLUCAN) 200 MG tablet Take 1 tablet (200 mg total) by mouth every other day. 3 tablet 2   fluticasone (FLONASE) 50 MCG/ACT nasal spray Place 1 spray into both nostrils daily as needed (congestion).   5   furosemide (LASIX) 20 MG tablet Take 20 mg by mouth daily.  5   gabapentin (NEURONTIN) 600 MG tablet Take 600 mg by mouth 3 (three) times daily.  0   HYDROcodone-homatropine (HYCODAN) 5-1.5 MG/5ML syrup Take 5 mLs by mouth every 6 (six) hours as needed for cough. (Patient not taking: Reported on 03/05/2019) 80 mL 0   Insulin Detemir (LEVEMIR FLEXTOUCH) 100 UNIT/ML Pen Inject 20 Units into the skin daily at 10 pm. (Patient not taking: Reported on 03/05/2019) 15 mL 2   Insulin Pen Needle 32G X 4 MM MISC Use to inject insulin 4 times daily. (Patient not taking: Reported on  03/05/2019) 130 each 5   Insulin Syringe-Needle U-100 (INSULIN SYRINGE 1CC/31GX5/16") 31G X 5/16" 1 ML MISC USE AS DIRECTED FOUR TIMES DAILY FOR INSULIN INJECTIONS. (Patient not taking: Reported on 03/05/2019) 200 each 0   ipratropium-albuterol (DUONEB) 0.5-2.5 (3) MG/3ML SOLN Inhale 1 vial via nebulizer 2 times daily  3   levofloxacin (LEVAQUIN) 750 MG tablet Take 1 tablet (750 mg total) by mouth daily. (Patient not taking: Reported on 03/05/2019) 5 tablet 0   losartan (COZAAR) 50 MG tablet Take 1 tablet (50 mg total) by mouth daily. (Patient not taking: Reported on 03/05/2019) 30 tablet 11   lubiprostone (AMITIZA) 24 MCG capsule Take 24 mcg by mouth 2 (two) times daily with a meal.     metFORMIN (GLUCOPHAGE-XR) 500 MG 24 hr tablet Take 2 tablets (1,000 mg total) by mouth daily with supper. (Patient not taking: Reported on 03/05/2019) 180 tablet 1   mometasone (ELOCON) 0.1 % cream Apply 1 application topically 2 (two) times daily as needed (wound care (vaginal boils)).   2   nitrofurantoin, macrocrystal-monohydrate, (MACROBID) 100 MG capsule TAKE 1 CAPSULE (100 MG TOTAL) BY MOUTH 2 (TWO) TIMES DAILY FOR 7 DAYS 14 capsule 2   nitroGLYCERIN (NITROSTAT) 0.4 MG SL tablet Place 1 tablet (0.4 mg total) under the tongue every 5 (five) minutes as needed for chest pain. (Patient not taking: Reported on 03/05/2019) 25 tablet 5   NOVOLOG FLEXPEN 100 UNIT/ML FlexPen INJECT 10-12 UNITS INTO THE SKIN 3 TIMES DAILY WITH MEALS. PLUS SLIDING SCALE. (Patient not taking: Reported on 03/05/2019) 15 mL 5   nystatin (MYCOSTATIN) 100000 UNIT/ML suspension Swish and swallow 91mls by mouth every 6 hours  0   oxyCODONE-acetaminophen (PERCOCET) 10-325 MG tablet Take 1 tablet by mouth every 4 (four) hours as needed for pain.     pantoprazole (PROTONIX) 40 MG tablet Take 40 mg by mouth daily.  1   predniSONE (DELTASONE) 20 MG tablet Take 2 tablets (  40 mg total) by mouth daily with breakfast. (Patient not taking: Reported on  03/05/2019) 8 tablet 0   promethazine (PHENERGAN) 25 MG tablet TAKE 1/2-1 TABLET BY MOUTH THREE TIMES PER DAY AS NEEDED FOR NAUSEA OR VOMITING  0   simvastatin (ZOCOR) 40 MG tablet TAKE 1 TABLET BY MOUTH ONCE A DAY AT 6:00 PM (Patient not taking: Reported on 03/05/2019) 30 tablet 10   TANZEUM 30 MG PEN Inject 30 mg as directed once a week. On Sun  1   tinidazole (TINDAMAX) 500 MG tablet Take 2 tablets (1,000 mg total) by mouth daily with breakfast. 10 tablet 2   tiotropium (SPIRIVA HANDIHALER) 18 MCG inhalation capsule Place 1 capsule (18 mcg total) into inhaler and inhale daily. (Patient not taking: Reported on 02/04/2017) 30 capsule 3   traMADol (ULTRAM) 50 MG tablet Take 2 tablets (100 mg total) by mouth every 6 (six) hours as needed for moderate pain or severe pain. (Patient not taking: Reported on 03/05/2019) 20 tablet 0   traZODone (DESYREL) 100 MG tablet Take 50 mg by mouth at bedtime as needed for sleep.      valACYclovir (VALTREX) 1000 MG tablet Take 1 tablet (1,000 mg total) by mouth 2 (two) times daily. Take for 3 days prn each outbreak. 30 tablet 11   vitamin B-12 (CYANOCOBALAMIN) 1000 MCG tablet Take 1,000 mcg by mouth daily.     No current facility-administered medications for this visit.    Review of Systems Review of Systems Constitutional: negative for fatigue and weight loss Respiratory: negative for cough and wheezing Cardiovascular: negative for chest pain, fatigue and palpitations Gastrointestinal: negative for abdominal pain and change in bowel habits Genitourinary:positive for burning and pain with urination Integument/breast: negative for nipple discharge Musculoskeletal:negative for myalgias Neurological: negative for gait problems and tremors Behavioral/Psych: negative for abusive relationship, depression Endocrine: negative for temperature intolerance      Blood pressure (!) 174/84, pulse 85, weight 219 lb (99.3 kg).  Physical Exam Physical Exam            General: Alert and no distress Abdomen:  normal findings: no organomegaly, soft, non-tender and no hernia  Pelvis:  External genitalia: normal general appearance Urinary system: urethral meatus normal and bladder without fullness, nontender Vaginal: normal without tenderness, induration or masses Cervix: normal appearance Adnexa: normal bimanual exam Uterus: anteverted and non-tender, normal size    50% of 15 min visit spent on counseling and coordination of care.   Data Reviewed U/A: small leukocytes  Assessment       1. Dysuria Rx: - POCT Urinalysis Dipstick - Urine Culture - nitrofurantoin, macrocrystal-monohydrate, (MACROBID) 100 MG capsule; TAKE 1 CAPSULE (100 MG TOTAL) BY MOUTH 2 (TWO) TIMES DAILY FOR 7 DAYS  Dispense: 14 capsule; Refill: 2  2. Vaginal discharge Rx: - Cervicovaginal ancillary only( [place])  3. Candida vaginitis Rx: - fluconazole (DIFLUCAN) 200 MG tablet; Take 1 tablet (200 mg total) by mouth every other day.  Dispense: 3 tablet; Refill: 2  4. Screen for STD (sexually transmitted disease) Rx: - HIV antibody (with reflex) - RPR - Hepatitis C Antibody - Hepatitis B Surface AntiGEN  5. 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

## 2019-06-22 LAB — CERVICOVAGINAL ANCILLARY ONLY
Bacterial Vaginitis (gardnerella): NEGATIVE
Candida Glabrata: POSITIVE — AB
Candida Vaginitis: NEGATIVE
Chlamydia: NEGATIVE
Comment: NEGATIVE
Comment: NEGATIVE
Comment: NEGATIVE
Comment: NEGATIVE
Comment: NEGATIVE
Comment: NORMAL
Neisseria Gonorrhea: NEGATIVE
Trichomonas: NEGATIVE

## 2019-06-23 ENCOUNTER — Other Ambulatory Visit: Payer: Self-pay | Admitting: Obstetrics

## 2019-06-23 DIAGNOSIS — B3731 Acute candidiasis of vulva and vagina: Secondary | ICD-10-CM

## 2019-06-23 LAB — RPR, QUANT+TP ABS (REFLEX)
Rapid Plasma Reagin, Quant: 1:1 {titer} — ABNORMAL HIGH
T Pallidum Abs: NONREACTIVE

## 2019-06-23 LAB — HIV ANTIBODY (ROUTINE TESTING W REFLEX): HIV Screen 4th Generation wRfx: NONREACTIVE

## 2019-06-23 LAB — RPR: RPR Ser Ql: REACTIVE — AB

## 2019-06-23 LAB — HEPATITIS C ANTIBODY: Hep C Virus Ab: <0.1 s/co ratio (ref 0.0–0.9)

## 2019-06-23 LAB — HEPATITIS B SURFACE ANTIGEN: Hepatitis B Surface Ag: NEGATIVE

## 2019-06-23 LAB — URINE CULTURE

## 2019-06-23 MED ORDER — FLUCONAZOLE 150 MG PO TABS: 150.0000 mg | ORAL_TABLET | Freq: Once | ORAL | 0 refills | Status: DC

## 2019-09-06 ENCOUNTER — Other Ambulatory Visit: Payer: Self-pay | Admitting: Obstetrics

## 2019-09-06 DIAGNOSIS — R300 Dysuria: Secondary | ICD-10-CM

## 2019-09-06 DIAGNOSIS — B3731 Acute candidiasis of vulva and vagina: Secondary | ICD-10-CM

## 2019-09-27 ENCOUNTER — Ambulatory Visit (INDEPENDENT_AMBULATORY_CARE_PROVIDER_SITE_OTHER): Payer: Medicaid Other | Admitting: Obstetrics

## 2019-09-27 ENCOUNTER — Other Ambulatory Visit: Payer: Self-pay

## 2019-09-27 ENCOUNTER — Encounter: Payer: Self-pay | Admitting: Obstetrics

## 2019-09-27 ENCOUNTER — Other Ambulatory Visit (HOSPITAL_COMMUNITY)
Admission: RE | Admit: 2019-09-27 | Discharge: 2019-09-27 | Disposition: A | Discharge status: Home / Self Care | Payer: Medicaid Other | Source: Ambulatory Visit | Attending: Obstetrics | Admitting: Obstetrics

## 2019-09-27 VITALS — BP 173/80 | HR 80 | Wt 209.3 lb

## 2019-09-27 DIAGNOSIS — E139 Other specified diabetes mellitus without complications: Secondary | ICD-10-CM | POA: Diagnosis not present

## 2019-09-27 DIAGNOSIS — N898 Other specified noninflammatory disorders of vagina: Secondary | ICD-10-CM | POA: Insufficient documentation

## 2019-09-27 DIAGNOSIS — Z87440 Personal history of urinary (tract) infections: Secondary | ICD-10-CM | POA: Diagnosis not present

## 2019-09-27 DIAGNOSIS — B373 Candidiasis of vulva and vagina: Secondary | ICD-10-CM

## 2019-09-27 DIAGNOSIS — R300 Dysuria: Secondary | ICD-10-CM | POA: Diagnosis not present

## 2019-09-27 DIAGNOSIS — B3731 Acute candidiasis of vulva and vagina: Secondary | ICD-10-CM

## 2019-09-27 LAB — POCT URINALYSIS DIPSTICK
Bilirubin, UA: NEGATIVE
Blood, UA: NEGATIVE
Glucose, UA: NEGATIVE
Ketones, UA: NEGATIVE
Leukocytes, UA: NEGATIVE
Nitrite, UA: NEGATIVE
Odor: NEGATIVE
Protein, UA: POSITIVE — AB
Spec Grav, UA: 1.01 (ref 1.010–1.025)
Urobilinogen, UA: 1 E.U./dL
pH, UA: 7 (ref 5.0–8.0)

## 2019-09-27 MED ORDER — NITROFURANTOIN MONOHYD MACRO 100 MG PO CAPS: 100.0000 mg | ORAL_CAPSULE | Freq: Two times a day (BID) | ORAL | 2 refills | Status: DC

## 2019-09-27 MED ORDER — FLUCONAZOLE 200 MG PO TABS: 200.0000 mg | ORAL_TABLET | ORAL | 2 refills | Status: DC

## 2019-09-27 NOTE — Progress Notes (Signed)
Pt c/o dysuria, low abd, vaginal burning, and itching.  Recheck for all STD testing  Normal pap 03/05/2019 Normal Mammogram 04/03/2019

## 2019-09-27 NOTE — Addendum Note (Signed)
Addended by: Maryruth Eve on: 09/27/2019 04:59 PM   Modules accepted: Orders

## 2019-09-27 NOTE — Progress Notes (Signed)
Patient ID: GLORIOUS FLICKER, female   DOB: October 05, 1965, 54 y.o.   MRN: [id number]  Chief Complaint  Patient presents with  . Dysuria    HPI Cassidy [name] is a 54 y.o. female.  Complains of burning with urination, lower abdominal pain and vaginal discharge and itching.  She is a Type 2 Diabetic and has a history of frequent UTI's and yeast infections. HPI  Past Medical History:  Diagnosis Date  . Anemia   . Atherosclerotic heart disease of native coronary artery with angina pectoris ([place]) 05/2012, 10/2013   a.  s/p PCTA to dRCA and ostial RPAV-PLA vessel + PDA branch (05/2012)  b. [place] s/p DES to mLAD - Resolute DES 3.0 x 22 (3.79mm -->3.3 mm)  . Bipolar depression ([name])   . Breast abscess    a. right side.   Marland Kitchen COPD (chronic obstructive pulmonary disease) ([place])   . Depression   . Diabetic peripheral neuropathy ([place])   . Fibromyalgia   . Genital warts   . GERD (gastroesophageal reflux disease)   . HLD (hyperlipidemia)   . Hypertension   . Pain in limb    a. LE VENOUS DUPLEX, 02/05/2009 - no evidence of deep vein thrombosis, Baker's cyst  . PTSD (post-traumatic stress disorder)   . Sickle cell trait ([place])   . Tobacco abuse   . Tooth caries   . Type II diabetes mellitus ([place])     Past Surgical History:  Procedure Laterality Date  . BLADDER SURGERY  1980   "TVT"  . BREAST EXCISIONAL BIOPSY    . BREAST SURGERY Right    I&D for multiple abscesses  . cutting balloon    . EYE SURGERY Left    laser surgery  . INCISE AND DRAIN ABCESS  ; 04/26/11; 08/13/11   right breast  . INCISION AND DRAINAGE ABSCESS Right 05/09/2012   Procedure: INCISION AND DRAINAGE RIGHT BREAST ABSCESS;  Surgeon: Imogene Burn. Georgette Dover, MD;  Location: [place];  Service: General;  Laterality: Right;  . INCISION AND DRAINAGE ABSCESS Right 02/08/2014   Procedure: INCISION AND DRAINAGE RIGHT BREAST ABSCESS;  Surgeon: Donnie Mesa, MD;  Location: [place];  Service: General;  Laterality: Right;  . INCISION AND DRAINAGE ABSCESS  Right 06/14/2014   Procedure: INCISION AND DRAINAGE RIGHT BREAST ABSCESS;  Surgeon: Donnie Mesa, MD;  Location: [place];  Service: General;  Laterality: Right;  . IRRIGATION AND DEBRIDEMENT ABSCESS  12/19/2010   Procedure: IRRIGATION AND DEBRIDEMENT ABSCESS;  Surgeon: Rolm Bookbinder, MD;  Location: [place];  Service: General;  Laterality: Right;  . IRRIGATION AND DEBRIDEMENT ABSCESS  08/13/2011   Procedure: MINOR INCISION AND DRAINAGE OF ABSCESS;  Surgeon: Gwenyth Ober, MD;  Location: [place];  Service: General;  Laterality: Right;  Right Breast   . IRRIGATION AND DEBRIDEMENT ABSCESS  11/17/2011   Procedure: IRRIGATION AND DEBRIDEMENT ABSCESS;  Surgeon: Imogene Burn. Georgette Dover, MD;  Location: [place] [facility];  Service: General;  Laterality: Right;  irrigation and debridement right recurrent breast abscess  . LARYNX SURGERY    . LEFT HEART CATHETERIZATION WITH CORONARY ANGIOGRAM N/A 05/29/2012   Procedure: LEFT HEART CATHETERIZATION WITH CORONARY ANGIOGRAM & PTCA;  Surgeon: Troy Sine, MD;  Location: [facility] CATH LAB;  Service: Cardiovascular; Bifurcation dRCA-RPAD/PLA & rPDA  PTCA.  Marland Kitchen LEFT HEART CATHETERIZATION WITH CORONARY ANGIOGRAM N/A 11/08/2013   Procedure: LEFT HEART CATHETERIZATION WITH CORONARY ANGIOGRAM and Coronary Stent Intervention;  Surgeon: Leonie Man, MD;  Location: [facility] CATH LAB;  Service: Cardiovascular;  mLAD 80% (FFR 0.73) - resolute DES 3.0 x 22 mm (postdilated to 3.3 mm->3.5 mm)  . NM MYOVIEW LTD  01/26/2009   Normal study, no evidence of ischemia, EF 67%  . ployp removed from voice box 03/30/12    . REFRACTIVE SURGERY  ~ 2010   right  . TONSILLECTOMY  1988    Family History  Problem Relation Age of Onset  . COPD Mother   . Cancer Mother        lymphoma  . Heart disease Mother   . Stomach cancer Mother   . Hyperlipidemia Father   . Heart disease Father   . Hypertension Maternal Grandmother   . Diabetes Maternal Grandmother   . Cancer Maternal Aunt        breast, colon  . Crohn's  disease Maternal Aunt   . Hypertension Brother   . Esophageal cancer Neg Hx   . Rectal cancer Neg Hx   . Liver cancer Neg Hx     Social History Social History   Tobacco Use  . Smoking status: Current Every Day Smoker    Packs/day: 0.25    Years: 30.00    Pack years: 7.50    Types: Cigarettes  . Smokeless tobacco: Never Used  . Tobacco comment: 08/14/11 "quit for 3 wk 05/2011; was smoking 1 ppd since age 29 before I quit; at least I've cut down to 1/4 ppd"  Substance Use Topics  . 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 Drug use: No    Types: Marijuana, "Crack" cocaine    Comment: 08/14/11 'quit everything in 2005"    Allergies  Allergen Reactions  . Amoxicillin Hives    Has patient had a PCN reaction causing immediate rash, facial/tongue/throat swelling, SOB or lightheadedness with hypotension: Yes Has patient had a PCN reaction causing severe rash involving mucus membranes or skin necrosis: No Has patient had a PCN reaction that required hospitalization No Has patient had a PCN reaction occurring within the last 10 years: Yes If all of the above answers are "NO", then may proceed with Cephalosporin use.    Current Outpatient Medications  Medication Sig Dispense Refill  . ACCU-CHEK AVIVA PLUS test strip TEST BLOOD SUGAR THREE TIMES DAILY AS DIRECTED (Patient not taking: Reported on 03/05/2019) 100 each 2  . acyclovir (ZOVIRAX) 400 MG tablet Take 1 tablet (400 mg total) by mouth 3 (three) times daily. (Patient not taking: Reported on 03/05/2019) 30 tablet 11  . Albiglutide (TANZEUM) 50 MG PEN Inject 50 mg weekly under skin (Patient not taking: Reported on 03/05/2019) 12 each 1  . albuterol (PROVENTIL HFA;VENTOLIN HFA) 108 (90 Base) MCG/ACT inhaler Inhale 1-2 puffs into the lungs every 6 (six) hours as needed for wheezing or shortness of breath. (Patient not taking: Reported on  03/05/2019) 1 Inhaler 0  . albuterol (PROVENTIL) (2.5 MG/3ML) 0.083% nebulizer solution Take 2.5 mg by nebulization every 6 (six) hours as needed for wheezing or shortness of breath.    Marland Kitchen aspirin EC 81 MG tablet Take 81 mg by mouth daily.    . baclofen (LIORESAL) 10 MG tablet Take 10 mg by mouth 3 (three) times daily.  0  . benzonatate (TESSALON) 100 MG capsule Take 1 capsule (100 mg total) by mouth every 8 (eight) hours. (Patient not taking: Reported on 02/15/2018) 21 capsule 0  . budesonide-formoterol (SYMBICORT) 160-4.5 MCG/ACT inhaler Inhale 2 puffs into  the lungs 2 (two) times daily. (Patient not taking: Reported on 03/05/2019) 1 Inhaler 3  . buPROPion (WELLBUTRIN SR) 150 MG 12 hr tablet Take 150 mg by mouth 2 (two) times daily.  5  . carvedilol (COREG) 6.25 MG tablet Take 1 tablet (6.25 mg total) by mouth 2 (two) times daily with a meal. (Patient not taking: Reported on 03/05/2019) 60 tablet 11  . clindamycin (CLEOCIN T) 1 % lotion Apply 1 application topically 2 (two) times daily. Apply to eczema on shoulders  2  . clopidogrel (PLAVIX) 75 MG tablet Take 1 tablet (75 mg total) by mouth daily. (Patient not taking: Reported on 02/15/2018) 30 tablet 11  . diclofenac sodium (VOLTAREN) 1 % GEL Apply 1 application topically 3 (three) times daily as needed (pain).    . fluconazole (DIFLUCAN) 200 MG tablet Take 1 tablet (200 mg total) by mouth every other day. 3 tablet 2  . fluticasone (FLONASE) 50 MCG/ACT nasal spray Place 1 spray into both nostrils daily as needed (congestion).   5  . furosemide (LASIX) 20 MG tablet Take 20 mg by mouth daily.  5  . gabapentin (NEURONTIN) 600 MG tablet Take 600 mg by mouth 3 (three) times daily.  0  . HYDROcodone-homatropine (HYCODAN) 5-1.5 MG/5ML syrup Take 5 mLs by mouth every 6 (six) hours as needed for cough. (Patient not taking: Reported on 03/05/2019) 80 mL 0  . Insulin Detemir (LEVEMIR FLEXTOUCH) 100 UNIT/ML Pen Inject 20 Units into the skin daily at 10 pm. (Patient  not taking: Reported on 03/05/2019) 15 mL 2  . Insulin Pen Needle 32G X 4 MM MISC Use to inject insulin 4 times daily. (Patient not taking: Reported on 03/05/2019) 130 each 5  . Insulin Syringe-Needle U-100 (INSULIN SYRINGE 1CC/31GX5/16") 31G X 5/16" 1 ML MISC USE AS DIRECTED FOUR TIMES DAILY FOR INSULIN INJECTIONS. (Patient not taking: Reported on 03/05/2019) 200 each 0  . ipratropium-albuterol (DUONEB) 0.5-2.5 (3) MG/3ML SOLN Inhale 1 vial via nebulizer 2 times daily  3  . levofloxacin (LEVAQUIN) 750 MG tablet Take 1 tablet (750 mg total) by mouth daily. (Patient not taking: Reported on 03/05/2019) 5 tablet 0  . losartan (COZAAR) 50 MG tablet Take 1 tablet (50 mg total) by mouth daily. (Patient not taking: Reported on 03/05/2019) 30 tablet 11  . lubiprostone (AMITIZA) 24 MCG capsule Take 24 mcg by mouth 2 (two) times daily with a meal.    . metFORMIN (GLUCOPHAGE-XR) 500 MG 24 hr tablet Take 2 tablets (1,000 mg total) by mouth daily with supper. (Patient not taking: Reported on 03/05/2019) 180 tablet 1  . mometasone (ELOCON) 0.1 % cream Apply 1 application topically 2 (two) times daily as needed (wound care (vaginal boils)).   2  . nitrofurantoin, macrocrystal-monohydrate, (MACROBID) 100 MG capsule Take 1 capsule (100 mg total) by mouth 2 (two) times daily. 14 capsule 2  . nitroGLYCERIN (NITROSTAT) 0.4 MG SL tablet Place 1 tablet (0.4 mg total) under the tongue every 5 (five) minutes as needed for chest pain. (Patient not taking: Reported on 03/05/2019) 25 tablet 5  . NOVOLOG FLEXPEN 100 UNIT/ML FlexPen INJECT 10-12 UNITS INTO THE SKIN 3 TIMES DAILY WITH MEALS. PLUS SLIDING SCALE. (Patient not taking: Reported on 03/05/2019) 15 mL 5  . nystatin (MYCOSTATIN) 100000 UNIT/ML suspension Swish and swallow 58mls by mouth every 6 hours  0  . oxyCODONE-acetaminophen (PERCOCET) 10-325 MG tablet Take 1 tablet by mouth every 4 (four) hours as needed for pain.    . pantoprazole (  PROTONIX) 40 MG tablet Take 40 mg by mouth  daily.  1  . predniSONE (DELTASONE) 20 MG tablet Take 2 tablets (40 mg total) by mouth daily with breakfast. (Patient not taking: Reported on 03/05/2019) 8 tablet 0  . promethazine (PHENERGAN) 25 MG tablet TAKE 1/2-1 TABLET BY MOUTH THREE TIMES PER DAY AS NEEDED FOR NAUSEA OR VOMITING  0  . simvastatin (ZOCOR) 40 MG tablet TAKE 1 TABLET BY MOUTH ONCE A DAY AT 6:00 PM (Patient not taking: Reported on 03/05/2019) 30 tablet 10  . TANZEUM 30 MG PEN Inject 30 mg as directed once a week. On Sun  1  . tinidazole (TINDAMAX) 500 MG tablet Take 2 tablets (1,000 mg total) by mouth daily with breakfast. 10 tablet 2  . tiotropium (SPIRIVA HANDIHALER) 18 MCG inhalation capsule Place 1 capsule (18 mcg total) into inhaler and inhale daily. (Patient not taking: Reported on 02/04/2017) 30 capsule 3  . traMADol (ULTRAM) 50 MG tablet Take 2 tablets (100 mg total) by mouth every 6 (six) hours as needed for moderate pain or severe pain. (Patient not taking: Reported on 03/05/2019) 20 tablet 0  . traZODone (DESYREL) 100 MG tablet Take 50 mg by mouth at bedtime as needed for sleep.     . valACYclovir (VALTREX) 1000 MG tablet Take 1 tablet (1,000 mg total) by mouth 2 (two) times daily. Take for 3 days prn each outbreak. 30 tablet 11  . vitamin B-12 (CYANOCOBALAMIN) 1000 MCG tablet Take 1,000 mcg by mouth daily.     No current facility-administered medications for this visit.    Review of Systems Review of Systems Constitutional: negative for fatigue and weight loss Respiratory: negative for cough and wheezing Cardiovascular: negative for chest pain, fatigue and palpitations Gastrointestinal: negative for abdominal pain and change in bowel habits Genitourinary: positive for burning with urination and vaginal discharge and itching Integument/breast: negative for nipple discharge Musculoskeletal:negative for myalgias Neurological: negative for gait problems and tremors Behavioral/Psych: negative for abusive relationship,  depression Endocrine: negative for temperature intolerance      Blood pressure (!) 173/80, pulse 80, weight 209 lb 4.8 oz (94.9 kg).  Physical Exam Physical Exam           General:  Alert and no distress Abdomen:  normal findings: no organomegaly, soft, non-tender and no hernia  Pelvis:  External genitalia: normal general appearance Urinary system: urethral meatus normal and bladder without fullness, nontender Vaginal: normal without tenderness, induration or masses Cervix: normal appearance Adnexa: normal bimanual exam Uterus: anteverted and non-tender, normal size    50% of 15 min visit spent on counseling and coordination of care.   Data Reviewed Urinalysis and previous Urine Cultures  Assessment     1. Dysuria Rx: - Urine Culture - nitrofurantoin, macrocrystal-monohydrate, (MACROBID) 100 MG capsule; Take 1 capsule (100 mg total) by mouth 2 (two) times daily.  Dispense: 14 capsule; Refill: 2  2. History of recurrent UTIs Rx: - Urine Culture - POCT Urinalysis Dipstick  3. Diabetes 1.5, managed as type 2 ([place]) - managed by PCP  4. Candida vaginitis Rx: - fluconazole (DIFLUCAN) 200 MG tablet; Take 1 tablet (200 mg total) by mouth every other day.  Dispense: 3 tablet; Refill: 2    Plan  Follow up prn   Orders Placed This Encounter  Procedures  . Urine Culture  . Hepatitis B surface antigen  . RPR  . Hepatitis C antibody  . HIV Antibody (routine testing w rflx)  . POCT Urinalysis Dipstick  Meds ordered this encounter  Medications  . nitrofurantoin, macrocrystal-monohydrate, (MACROBID) 100 MG capsule    Sig: Take 1 capsule (100 mg total) by mouth 2 (two) times daily.    Dispense:  14 capsule    Refill:  2  . fluconazole (DIFLUCAN) 200 MG tablet    Sig: Take 1 tablet (200 mg total) by mouth every other day.    Dispense:  3 tablet    Refill:  2     Shelly Bombard, MD 09/27/2019 11:29 AM

## 2019-09-28 ENCOUNTER — Other Ambulatory Visit: Payer: Self-pay | Admitting: Obstetrics

## 2019-09-28 DIAGNOSIS — B3731 Acute candidiasis of vulva and vagina: Secondary | ICD-10-CM

## 2019-10-01 LAB — URINE CULTURE

## 2019-10-01 LAB — CERVICOVAGINAL ANCILLARY ONLY
Bacterial Vaginitis (gardnerella): NEGATIVE
Candida Glabrata: POSITIVE — AB
Candida Vaginitis: NEGATIVE
Chlamydia: NEGATIVE
Comment: NEGATIVE
Comment: NEGATIVE
Comment: NEGATIVE
Comment: NEGATIVE
Comment: NEGATIVE
Comment: NORMAL
Neisseria Gonorrhea: NEGATIVE
Trichomonas: NEGATIVE

## 2019-10-04 ENCOUNTER — Other Ambulatory Visit: Payer: Self-pay | Admitting: Obstetrics

## 2019-12-21 ENCOUNTER — Other Ambulatory Visit: Payer: Self-pay | Admitting: Obstetrics

## 2019-12-21 DIAGNOSIS — R300 Dysuria: Secondary | ICD-10-CM

## 2019-12-21 DIAGNOSIS — A6000 Herpesviral infection of urogenital system, unspecified: Secondary | ICD-10-CM

## 2020-01-14 ENCOUNTER — Other Ambulatory Visit: Payer: Self-pay | Admitting: Obstetrics

## 2020-01-14 DIAGNOSIS — B3731 Acute candidiasis of vulva and vagina: Secondary | ICD-10-CM

## 2020-01-14 DIAGNOSIS — B373 Candidiasis of vulva and vagina: Secondary | ICD-10-CM

## 2020-01-18 ENCOUNTER — Other Ambulatory Visit: Payer: Self-pay

## 2020-01-18 ENCOUNTER — Ambulatory Visit (INDEPENDENT_AMBULATORY_CARE_PROVIDER_SITE_OTHER): Payer: Medicaid Other | Admitting: Obstetrics

## 2020-01-18 ENCOUNTER — Encounter: Payer: Self-pay | Admitting: Obstetrics

## 2020-01-18 ENCOUNTER — Other Ambulatory Visit (HOSPITAL_COMMUNITY)
Admission: RE | Admit: 2020-01-18 | Discharge: 2020-01-18 | Disposition: A | Discharge status: Home / Self Care | Payer: Medicaid Other | Source: Ambulatory Visit | Attending: Obstetrics | Admitting: Obstetrics

## 2020-01-18 VITALS — BP 173/95 | HR 84 | Ht 68.0 in | Wt 215.0 lb

## 2020-01-18 DIAGNOSIS — R300 Dysuria: Secondary | ICD-10-CM | POA: Diagnosis not present

## 2020-01-18 DIAGNOSIS — Z113 Encounter for screening for infections with a predominantly sexual mode of transmission: Secondary | ICD-10-CM

## 2020-01-18 DIAGNOSIS — N898 Other specified noninflammatory disorders of vagina: Secondary | ICD-10-CM

## 2020-01-18 DIAGNOSIS — E669 Obesity, unspecified: Secondary | ICD-10-CM

## 2020-01-18 DIAGNOSIS — E139 Other specified diabetes mellitus without complications: Secondary | ICD-10-CM

## 2020-01-18 DIAGNOSIS — I10 Essential (primary) hypertension: Secondary | ICD-10-CM

## 2020-01-18 DIAGNOSIS — F17200 Nicotine dependence, unspecified, uncomplicated: Secondary | ICD-10-CM

## 2020-01-18 LAB — POCT URINALYSIS DIPSTICK
Bilirubin, UA: NEGATIVE
Blood, UA: NEGATIVE
Glucose, UA: NEGATIVE
Ketones, UA: NEGATIVE
Nitrite, UA: NEGATIVE
Protein, UA: NEGATIVE
Spec Grav, UA: >=1.03 — AB (ref 1.010–1.025)
Urobilinogen, UA: 0.2 E.U./dL
pH, UA: 5 (ref 5.0–8.0)

## 2020-01-18 MED ORDER — LOSARTAN POTASSIUM 50 MG PO TABS: 50.0000 mg | ORAL_TABLET | Freq: Every day | ORAL | 11 refills | Status: DC

## 2020-01-18 MED ORDER — CARVEDILOL 6.25 MG PO TABS: 6.2500 mg | ORAL_TABLET | Freq: Two times a day (BID) | ORAL | 11 refills | Status: DC

## 2020-01-18 NOTE — Progress Notes (Signed)
Patient ID: Cassidy [name], female   DOB: July 18, 1965, 55 y.o.   MRN: [id number]  Chief Complaint  Patient presents with  . STD SCREENING  . Urinary Tract Infection    HPI Cassidy [name] is a 55 y.o. female.  Complains of vaginal discharge, malodorous dark yellow urine and burning with urination. HPI  Past Medical History:  Diagnosis Date  . Anemia   . Atherosclerotic heart disease of native coronary artery with angina pectoris ([place]) 05/2012, 10/2013   a.  s/p PCTA to dRCA and ostial RPAV-PLA vessel + PDA branch (05/2012)  b. [place] s/p DES to mLAD - Resolute DES 3.0 x 22 (3.61mm -->3.3 mm)  . Bipolar depression ([place])   . Breast abscess    a. right side.   Marland Kitchen COPD (chronic obstructive pulmonary disease) ([place])   . Depression   . Diabetic peripheral neuropathy ([place])   . Fibromyalgia   . Genital warts   . GERD (gastroesophageal reflux disease)   . HLD (hyperlipidemia)   . Hypertension   . Pain in limb    a. LE VENOUS DUPLEX, 02/05/2009 - no evidence of deep vein thrombosis, Baker's cyst  . PTSD (post-traumatic stress disorder)   . Sickle cell trait ([place])   . Tobacco abuse   . Tooth caries   . Type II diabetes mellitus ([place])     Past Surgical History:  Procedure Laterality Date  . BLADDER SURGERY  1980   "TVT"  . BREAST EXCISIONAL BIOPSY    . BREAST SURGERY Right    I&D for multiple abscesses  . cutting balloon    . EYE SURGERY Left    laser surgery  . INCISE AND DRAIN ABCESS  ; 04/26/11; 08/13/11   right breast  . INCISION AND DRAINAGE ABSCESS Right 05/09/2012   Procedure: INCISION AND DRAINAGE RIGHT BREAST ABSCESS;  Surgeon: Imogene Burn. Georgette Dover, MD;  Location: [place];  Service: General;  Laterality: Right;  . INCISION AND DRAINAGE ABSCESS Right 02/08/2014   Procedure: INCISION AND DRAINAGE RIGHT BREAST ABSCESS;  Surgeon: Donnie Mesa, MD;  Location: [place];  Service: General;  Laterality: Right;  . INCISION AND DRAINAGE ABSCESS Right 06/14/2014   Procedure: INCISION AND DRAINAGE  RIGHT BREAST ABSCESS;  Surgeon: Donnie Mesa, MD;  Location: [place];  Service: General;  Laterality: Right;  . IRRIGATION AND DEBRIDEMENT ABSCESS  12/19/2010   Procedure: IRRIGATION AND DEBRIDEMENT ABSCESS;  Surgeon: Rolm Bookbinder, MD;  Location: [place];  Service: General;  Laterality: Right;  . IRRIGATION AND DEBRIDEMENT ABSCESS  08/13/2011   Procedure: MINOR INCISION AND DRAINAGE OF ABSCESS;  Surgeon: Gwenyth Ober, MD;  Location: [place];  Service: General;  Laterality: Right;  Right Breast   . IRRIGATION AND DEBRIDEMENT ABSCESS  11/17/2011   Procedure: IRRIGATION AND DEBRIDEMENT ABSCESS;  Surgeon: Imogene Burn. Georgette Dover, MD;  Location: [place] [facility];  Service: General;  Laterality: Right;  irrigation and debridement right recurrent breast abscess  . LARYNX SURGERY    . LEFT HEART CATHETERIZATION WITH CORONARY ANGIOGRAM N/A 05/29/2012   Procedure: LEFT HEART CATHETERIZATION WITH CORONARY ANGIOGRAM & PTCA;  Surgeon: Troy Sine, MD;  Location: [facility] CATH LAB;  Service: Cardiovascular; Bifurcation dRCA-RPAD/PLA & rPDA  PTCA.  Marland Kitchen LEFT HEART CATHETERIZATION WITH CORONARY ANGIOGRAM N/A 11/08/2013   Procedure: LEFT HEART CATHETERIZATION WITH CORONARY ANGIOGRAM and Coronary Stent Intervention;  Surgeon: Leonie Man, MD;  Location: [facility] CATH LAB;  Service: Cardiovascular; mLAD 80% (FFR 0.73) - resolute DES 3.0 x 22 mm (postdilated  to 3.3 mm->3.5 mm)  . NM MYOVIEW LTD  01/26/2009   Normal study, no evidence of ischemia, EF 67%  . ployp removed from voice box 03/30/12    . REFRACTIVE SURGERY  ~ 2010   right  . TONSILLECTOMY  1988    Family History  Problem Relation Age of Onset  . COPD Mother   . Cancer Mother        lymphoma  . Heart disease Mother   . Stomach cancer Mother   . Hyperlipidemia Father   . Heart disease Father   . Hypertension Maternal Grandmother   . Diabetes Maternal Grandmother   . Cancer Maternal Aunt        breast, colon  . Crohn's disease Maternal Aunt   . Hypertension Brother   .  Esophageal cancer Neg Hx   . Rectal cancer Neg Hx   . Liver cancer Neg Hx     Social History Social History   Tobacco Use  . Smoking status: Current Every Day Smoker    Packs/day: 0.25    Years: 30.00    Pack years: 7.50    Types: Cigarettes  . Smokeless tobacco: Never Used  . Tobacco comment: 08/14/11 "quit for 3 wk 05/2011; was smoking 1 ppd since age 78 before I quit; at least I've cut down to 1/4 ppd"  Substance Use Topics  . 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 Drug use: No    Types: Marijuana, "Crack" cocaine    Comment: 08/14/11 'quit everything in 2005"    Allergies  Allergen Reactions  . Amoxicillin Hives    Has patient had a PCN reaction causing immediate rash, facial/tongue/throat swelling, SOB or lightheadedness with hypotension: Yes Has patient had a PCN reaction causing severe rash involving mucus membranes or skin necrosis: No Has patient had a PCN reaction that required hospitalization No Has patient had a PCN reaction occurring within the last 10 years: Yes If all of the above answers are "NO", then may proceed with Cephalosporin use.      Review of Systems Review of Systems Constitutional: negative for fatigue and weight loss Respiratory: negative for cough and wheezing Cardiovascular: negative for chest pain, fatigue and palpitations Gastrointestinal: negative for abdominal pain and change in bowel habits Genitourinary: positive for vaginal discharge, malodorous dark urine and burning with urination Integument/breast: negative for nipple discharge Musculoskeletal:negative for myalgias Neurological: negative for gait problems and tremors Behavioral/Psych: negative for abusive relationship, depression Endocrine: negative for temperature intolerance      Blood pressure (!) 173/95, pulse 84, height 5\' 8"  (1.727 m), weight 215 lb (97.5 kg).  Physical  Exam Physical Exam           Alert and no distress Abdomen:  normal findings: no organomegaly, soft, non-tender and no hernia  Pelvis:  External genitalia: normal general appearance Urinary system: urethral meatus normal and bladder without fullness, nontender Vaginal: normal without tenderness, induration or masses Cervix: normal appearance Adnexa: normal bimanual exam Uterus: anteverted and non-tender, normal size    50% of 15 min visit spent on counseling and coordination of care.   Data Reviewed Wet Prep Urinalysis  Assessment     1. Vaginal discharge Rx: - Cervicovaginal ancillary only( [place])  2. Screening for STD (sexually transmitted disease) Rx: - RPR+HBsAg+HCVAb+...  3. Dysuria and Dehydration Rx: - POCT Urinalysis Dipstick - Urine Culture  4. Obesity (BMI  30.0-34.9) Rx: - Ambulatory referral to Internal Medicine  5. Diabetes 1.5, managed as type 2 [facility]) Rx: - Ambulatory referral to Internal Medicine  6. HTN (hypertension), benign Rx: - losartan (COZAAR) 50 MG tablet; Take 1 tablet (50 mg total) by mouth daily.  Dispense: 30 tablet; Refill: 11 - carvedilol (COREG) 6.25 MG tablet; Take 1 tablet (6.25 mg total) by mouth 2 (two) times daily with a meal.  Dispense: 60 tablet; Refill: 11  7. Tobacco dependence - cessation with the aid of medication and behavioral modification recommended    Plan   Follow up prn  Orders Placed This Encounter  Procedures  . Urine Culture  . RPR+HBsAg+HCVAb+...  . Ambulatory referral to Internal Medicine    Referral Priority:   Routine    Referral Type:   Consultation    Referral Reason:   Specialty Services Required    Requested Specialty:   Internal Medicine    Number of Visits Requested:   1  . POCT Urinalysis Dipstick   Meds ordered this encounter  Medications  . carvedilol (COREG) 6.25 MG tablet    Sig: Take 1 tablet (6.25 mg total) by mouth 2 (two) times daily with a meal.    Dispense:  60 tablet     Refill:  11  . losartan (COZAAR) 50 MG tablet    Sig: Take 1 tablet (50 mg total) by mouth daily.    Dispense:  30 tablet    Refill:  11     Shelly Bombard, MD 01/18/2020 10:50 AM

## 2020-01-18 NOTE — Progress Notes (Signed)
GYN presents for STD screening/UTI.  C/o STD Exposure, itching, dysuria.  Needs PCP referral to manage BP. Wants refills on BP medication.  Last PAP 03/05/2019 Next Mammogram 04/02/2021

## 2020-01-21 LAB — CERVICOVAGINAL ANCILLARY ONLY
Bacterial Vaginitis (gardnerella): NEGATIVE
Candida Glabrata: POSITIVE — AB
Candida Vaginitis: NEGATIVE
Chlamydia: NEGATIVE
Comment: NEGATIVE
Comment: NEGATIVE
Comment: NEGATIVE
Comment: NEGATIVE
Comment: NEGATIVE
Comment: NORMAL
Neisseria Gonorrhea: NEGATIVE
Trichomonas: NEGATIVE

## 2020-01-22 ENCOUNTER — Other Ambulatory Visit: Payer: Self-pay | Admitting: Obstetrics

## 2020-01-22 DIAGNOSIS — N390 Urinary tract infection, site not specified: Secondary | ICD-10-CM

## 2020-01-22 DIAGNOSIS — B3731 Acute candidiasis of vulva and vagina: Secondary | ICD-10-CM

## 2020-01-22 DIAGNOSIS — B373 Candidiasis of vulva and vagina: Secondary | ICD-10-CM

## 2020-01-22 DIAGNOSIS — B9620 Unspecified Escherichia coli [E. coli] as the cause of diseases classified elsewhere: Secondary | ICD-10-CM

## 2020-01-22 LAB — URINE CULTURE

## 2020-01-22 LAB — RPR+HBSAG+HCVAB+...
HIV Screen 4th Generation wRfx: NONREACTIVE
Hep C Virus Ab: <0.1 s/co ratio (ref 0.0–0.9)
Hepatitis B Surface Ag: NEGATIVE
RPR Ser Ql: REACTIVE — AB

## 2020-01-22 LAB — RPR, QUANT+TP ABS (REFLEX)
Rapid Plasma Reagin, Quant: 1:1 {titer} — ABNORMAL HIGH
T Pallidum Abs: NONREACTIVE

## 2020-01-22 MED ORDER — NITROFURANTOIN MONOHYD MACRO 100 MG PO CAPS: 100.0000 mg | ORAL_CAPSULE | Freq: Two times a day (BID) | ORAL | 2 refills | Status: DC

## 2020-01-22 MED ORDER — FLUCONAZOLE 200 MG PO TABS: 200.0000 mg | ORAL_TABLET | ORAL | 2 refills | Status: DC

## 2020-03-13 ENCOUNTER — Other Ambulatory Visit: Payer: Self-pay | Admitting: Obstetrics

## 2020-03-13 DIAGNOSIS — B3731 Acute candidiasis of vulva and vagina: Secondary | ICD-10-CM

## 2020-03-13 DIAGNOSIS — N390 Urinary tract infection, site not specified: Secondary | ICD-10-CM

## 2020-03-13 DIAGNOSIS — B373 Candidiasis of vulva and vagina: Secondary | ICD-10-CM

## 2020-03-13 DIAGNOSIS — B9620 Unspecified Escherichia coli [E. coli] as the cause of diseases classified elsewhere: Secondary | ICD-10-CM

## 2020-03-13 DIAGNOSIS — Z0000 Encounter for general adult medical examination without abnormal findings: Secondary | ICD-10-CM

## 2020-03-13 MED ORDER — TERCONAZOLE 0.4 % VA CREA: 1.0000 | TOPICAL_CREAM | Freq: Every day | VAGINAL | 2 refills | Status: DC

## 2020-04-02 ENCOUNTER — Other Ambulatory Visit: Payer: Self-pay | Admitting: Obstetrics

## 2020-04-02 DIAGNOSIS — I10 Essential (primary) hypertension: Secondary | ICD-10-CM

## 2020-04-23 DIAGNOSIS — N3020 Other chronic cystitis without hematuria: Secondary | ICD-10-CM | POA: Diagnosis not present

## 2020-04-23 DIAGNOSIS — N8111 Cystocele, midline: Secondary | ICD-10-CM | POA: Diagnosis not present

## 2020-04-23 DIAGNOSIS — N952 Postmenopausal atrophic vaginitis: Secondary | ICD-10-CM | POA: Diagnosis not present

## 2020-04-23 DIAGNOSIS — N3941 Urge incontinence: Secondary | ICD-10-CM | POA: Diagnosis not present

## 2020-04-24 ENCOUNTER — Encounter: Payer: Self-pay | Admitting: Family Medicine

## 2020-04-24 ENCOUNTER — Encounter: Payer: Self-pay | Admitting: Obstetrics

## 2020-04-24 ENCOUNTER — Other Ambulatory Visit: Payer: Self-pay

## 2020-04-24 ENCOUNTER — Ambulatory Visit (INDEPENDENT_AMBULATORY_CARE_PROVIDER_SITE_OTHER): Payer: Medicaid Other | Admitting: Family Medicine

## 2020-04-24 VITALS — BP 156/74 | HR 80 | Ht 67.0 in | Wt 208.0 lb

## 2020-04-24 DIAGNOSIS — Z7689 Persons encountering health services in other specified circumstances: Secondary | ICD-10-CM | POA: Diagnosis not present

## 2020-04-24 DIAGNOSIS — Z13220 Encounter for screening for lipoid disorders: Secondary | ICD-10-CM | POA: Diagnosis not present

## 2020-04-24 DIAGNOSIS — F319 Bipolar disorder, unspecified: Secondary | ICD-10-CM | POA: Diagnosis not present

## 2020-04-24 DIAGNOSIS — E1121 Type 2 diabetes mellitus with diabetic nephropathy: Secondary | ICD-10-CM

## 2020-04-24 DIAGNOSIS — E7800 Pure hypercholesterolemia, unspecified: Secondary | ICD-10-CM | POA: Diagnosis not present

## 2020-04-24 DIAGNOSIS — Z1211 Encounter for screening for malignant neoplasm of colon: Secondary | ICD-10-CM | POA: Diagnosis not present

## 2020-04-24 DIAGNOSIS — I10 Essential (primary) hypertension: Secondary | ICD-10-CM

## 2020-04-24 MED ORDER — NITROGLYCERIN 0.4 MG SL SUBL: 0.4000 mg | SUBLINGUAL_TABLET | SUBLINGUAL | 5 refills | Status: DC | PRN

## 2020-04-24 MED ORDER — LOSARTAN POTASSIUM 50 MG PO TABS: 50.0000 mg | ORAL_TABLET | Freq: Every day | ORAL | 11 refills | Status: DC

## 2020-04-24 MED ORDER — ACCU-CHEK AVIVA PLUS VI STRP: ORAL_STRIP | 2 refills | Status: DC

## 2020-04-24 MED ORDER — PANTOPRAZOLE SODIUM 40 MG PO TBEC: 40.0000 mg | DELAYED_RELEASE_TABLET | Freq: Every day | ORAL | 1 refills | Status: DC

## 2020-04-24 MED ORDER — DICLOFENAC SODIUM 1 % EX GEL: 4.0000 g | Freq: Four times a day (QID) | CUTANEOUS | 0 refills | Status: DC

## 2020-04-24 NOTE — Assessment & Plan Note (Signed)
Currently only taking Levemir 20U at bedtime. No changes made to medications today. Last A1c in chart appears to be 7.7 in 2017. Patient reports not tolerating Metformin or other oral agents in the past.  -Hgb A1c -Urine microalbumin today -BMP today -Patient reports diabetic eye exam scheduled for 4/18 -Will need diabetic foot exam at follow up -Adjust medications as needed pending CBG's and A1c

## 2020-04-24 NOTE — Assessment & Plan Note (Signed)
55 y/o female with extensive PMHx including Angina s/p two LAD stents, HLD, COPD, T2DM with neuropathy, HTN, Bipolar/PTSD, fibromyalgia, GERD who presents today to establish care since her old PCP office closed down. Refills prescribed for Pantoprazole, Losartan, Nitroglycerin and Voltaren gel. Health maintenance-wise she needs screening colonoscopy. Given extensive PMHx and late arrival to appointment, unable to delve much more into HM. Requested close follow up to further address medical problems.  -Referral to GI for screening colonoscopy

## 2020-04-24 NOTE — Assessment & Plan Note (Addendum)
Was previously on bupropion but not on any medications anymore. PHQ-9 detailed above, with total score of 6 (improved from her two prior screens). She was provided with resources for therapy/counseling today. She would like to discuss her anxiety further at follow up and is interested in starting medication for this. Unfortunately we were unable to discuss this in detail today. She is not currently seeing a psychiatrist. She states that at the next appointment she will bring a list of her specialists that she has seen in the past and would like to see them again if possible. Denies SI today.  -Resources for therapy/counseling provided to patient today -Re-address anxiety at f/u and discuss potential medications. Avoid SSRI's given bipolar.

## 2020-04-24 NOTE — Patient Instructions (Addendum)
It was wonderful to see you today.  Please bring ALL of your medications with you to every visit.   Today we talked about:  -I have sent in refills to your pharmacy for Nitroglycerin, Pantoprazole, Losartan and Voltaren gel.   -Schedule an appointment to see me again in 1-2 weeks.   -We did some blood work today to check on your diabetes, cholesterol and kidney function. I will let you know the results.   -Below are resources for therapy/couseling.   Thank you for choosing [facility].   Please call [phone] with any questions about today's appointment.  Please be sure to schedule follow up at the front  desk before you leave today.   [name], DO PGY-1 Family Medicine    Therapy and Counseling Resources Most providers on this list will take Medicaid. Patients with commercial insurance or Medicare should contact their insurance company to get a list of in network providers.  BestDay:Psychiatry and Counseling [address] [facility] [place]. [place], [place] [address] [place], [place], [place] [address]      [place] [address]  [place], [place] [address] [phone]  [place] [address] [place]  [place],  [address]       [phone]     [facility] (virtual only) [phone]  Jinny Blossom [facility] [address], [place], [place]  Family Solutions:  [place]. [place] [phone]  [facility]:  [place] [address] [phone]  [facility] (under & uninsured) [address] [place] [phone]    [email or web address]    [place] [address] . [place]    [phone]  [facility]     Phone:  [phone]      Latta Simpson  Gateway [address]      [place], [place] ext [place]: [place], [place], [place]   [place] ([place] therapist) [email or web address]  [place] [address]   [place] [address]    [phone]    [facility]   [address],  [place], [place]   [place]  [place]  [facility]  [address] [place], [place]        [phone]  Open Access/Walk In Clinic under & uninsured  [facility]  [address] [place], [place] Crisis [phone]  [facility] [place],  ([place])   [place], [place]: [phone]) 8:30 - 12; 1 - 2:30  [facility] of the [place],  [place], [place]    ([phone]):8:30 - 12; 2 - 3PM  [facility] [employer],  [address],  [place]; [phone]):   Mon - Fri 8 AM - 5 PM  [facility] [place]  MWF 12:30 to 3:00 or call to schedule an appointment  [phone]  Specific Provider options Psychology Today  [email or web address] 1. click on find a therapist  2. enter your zip code 3. left side and select or  tailor a therapist for your specific need.   [facility] Provider Directory [email or web address]  (Medicaid)   Follow all drop down to find a provider  [place] [phone] or [email or web address] [address], [place], [place] Recovery support and educational   24- Hour Availability:  .  Marland Kitchen [facility]  . [place], [place] Crisis [phone]  . [facility] of the [employer]  [phone]  [facility] Crisis Service  [phone]   . [place]  [phone] (after hours)  . Therapeutic Alternative/Mobile Crisis   [phone]  . [place] National Suicide Hotline  [phone] ([place])  . Call 911 or go to emergency room  . [employer]  [phone]);  [place] and [employer]   . [facility] ACCESS  [phone]); [place], [place], [place], [place], [place], [place], [place]

## 2020-04-24 NOTE — Progress Notes (Addendum)
SUBJECTIVE:   CHIEF COMPLAINT / HPI:   Encounter to Establish Care Patient reports that her previous PCP office closed down. She is presenting today to establish care and to get back in control of her multiple "ailments". She does reports some situational stressors at the moment. She would like referral for counseling/therapy. She denies SI. She also reports that she was previously scheduled for a screening colonoscopy but then had difficulty with insurance and so she was never able to do it. She would like referral back to GI for this. Reports that she had an aunt pass away from colon cancer at age 23. Has had two Moderna COVID vaccines: 11/19/19, 12/18/19.  Type 2 Diabetes Mellitus  Takes 20U Levemir at night. Reports that she had diarrhea with Metformin and was also unable to tolerate other oral medications. She believes that her Hgb A1c will likely be elevated since her home blood sugars have been over 150.  She requests more glucose test strips. She has an appointment next Monday for an eye exam.    PERTINENT  PMH / PSH:  GERD T2DM with Neuropathy Angina- 2 stents placef in LAD Oct 2015 HTN Bipolar  PTSD Seasonal Allergies Fibromyalgia  Plantar fasciitis  Chronic back pain Genital Herpes HLD  COPD  OBJECTIVE:   BP (!) 156/74   Pulse 80   Ht 5\' 7"  (1.702 m)   Wt 208 lb (94.3 kg)   SpO2 99%   BMI 32.58 kg/m    General: NAD, pleasant, able to participate in exam Respiratory: Breathing comfortably on room air, without respiratory distress Psych: Normal affect and mood  Depression screen [facility] 2/9 04/24/2020 02/15/2018 02/04/2017  Decreased Interest 1 2 3   Down, Depressed, Hopeless 0 3 3  PHQ - 2 Score 1 5 6   Altered sleeping 2 3 2   Tired, decreased energy 1 3 3   Change in appetite 1 3 3   Feeling bad or failure about yourself  0 1 2  Trouble concentrating 0 3 3  Moving slowly or fidgety/restless 1 1 0  Suicidal thoughts 0 0 0  PHQ-9 Score 6 19 19   Difficult doing  work/chores - Very difficult Extremely dIfficult  Some recent data might be hidden     ASSESSMENT/PLAN:   Encounter to establish care with new doctor 55 y/o female with extensive PMHx including Angina s/p two LAD stents, HLD, COPD, T2DM with neuropathy, HTN, Bipolar/PTSD, fibromyalgia, GERD who presents today to establish care since her old PCP office closed down. Refills prescribed for Pantoprazole, Losartan, Nitroglycerin and Voltaren gel. Health maintenance-wise she needs screening colonoscopy. Given extensive PMHx and late arrival to appointment, unable to delve much more into HM. Requested close follow up to further address medical problems.  -Referral to GI for screening colonoscopy  Type II diabetes mellitus with nephropathy ([place]) Currently only taking Levemir 20U at bedtime. No changes made to medications today. Last A1c in chart appears to be 7.7 in 2017. Patient reports not tolerating Metformin or other oral agents in the past.  -Hgb A1c -Urine microalbumin today -BMP today -Patient reports diabetic eye exam scheduled for 4/18 -Will need diabetic foot exam at follow up -Adjust medications as needed pending CBG's and A1c  Bipolar depression Was previously on bupropion but not on any medications anymore. PHQ-9 detailed above, with total score of 6 (improved from her two prior screens). She was provided with resources for therapy/counseling today. She would like to discuss her anxiety further at follow up and is interested  in starting medication for this. Unfortunately we were unable to discuss this in detail today. She is not currently seeing a psychiatrist. She states that at the next appointment she will bring a list of her specialists that she has seen in the past and would like to see them again if possible. Denies SI today.  -Resources for therapy/counseling provided to patient today -Re-address anxiety at f/u and discuss potential medications. Avoid SSRI's given bipolar.       [name], [place]

## 2020-04-25 LAB — BASIC METABOLIC PANEL
BUN/Creatinine Ratio: 12 (ref 9–23)
BUN: 11 mg/dL (ref 6–24)
CO2: 22 mmol/L (ref 20–29)
Calcium: 9 mg/dL (ref 8.7–10.2)
Chloride: 103 mmol/L (ref 96–106)
Creatinine, Ser: 0.91 mg/dL (ref 0.57–1.00)
Glucose: 217 mg/dL — ABNORMAL HIGH (ref 65–99)
Potassium: 4 mmol/L (ref 3.5–5.2)
Sodium: 142 mmol/L (ref 134–144)
eGFR: 74 mL/min/{1.73_m2} (ref >59)

## 2020-04-25 LAB — MICROALBUMIN / CREATININE URINE RATIO
Creatinine, Urine: 142.6 mg/dL
Microalb/Creat Ratio: 18 mg/g creat (ref 0–29)
Microalbumin, Urine: 25.3 ug/mL

## 2020-04-25 LAB — HEMOGLOBIN A1C
Est. average glucose Bld gHb Est-mCnc: 148 mg/dL
Hgb A1c MFr Bld: 6.8 % — ABNORMAL HIGH (ref 4.8–5.6)

## 2020-04-25 LAB — LIPID PANEL
Chol/HDL Ratio: 4 ratio (ref 0.0–4.4)
Cholesterol, Total: 210 mg/dL — ABNORMAL HIGH (ref 100–199)
HDL: 52 mg/dL (ref >39)
LDL Chol Calc (NIH): 130 mg/dL — ABNORMAL HIGH (ref 0–99)
Triglycerides: 159 mg/dL — ABNORMAL HIGH (ref 0–149)
VLDL Cholesterol Cal: 28 mg/dL (ref 5–40)

## 2020-04-28 ENCOUNTER — Other Ambulatory Visit: Payer: Self-pay | Admitting: Family Medicine

## 2020-04-28 ENCOUNTER — Other Ambulatory Visit: Payer: Self-pay | Admitting: *Deleted

## 2020-04-28 MED ORDER — LEVEMIR FLEXTOUCH 100 UNIT/ML ~~LOC~~ SOPN: 10.0000 [IU] | PEN_INJECTOR | Freq: Every day | SUBCUTANEOUS | 0 refills | Status: DC

## 2020-04-28 MED ORDER — ATORVASTATIN CALCIUM 40 MG PO TABS: 40.0000 mg | ORAL_TABLET | Freq: Every day | ORAL | 3 refills | Status: DC

## 2020-04-28 MED ORDER — OZEMPIC (0.25 OR 0.5 MG/DOSE) 2 MG/1.5ML ~~LOC~~ SOPN: 0.2500 mg | PEN_INJECTOR | SUBCUTANEOUS | 0 refills | Status: DC

## 2020-04-28 NOTE — Telephone Encounter (Signed)
Patient returns call to nurse line regarding insulin rx. Patient is concerned as she only has two injections left. Patient informed of 67-70 hour refill policy.   To PCP  Talbot Grumbling, RN

## 2020-05-01 ENCOUNTER — Telehealth: Payer: Self-pay | Admitting: *Deleted

## 2020-05-01 ENCOUNTER — Other Ambulatory Visit: Payer: Self-pay | Admitting: Family Medicine

## 2020-05-01 DIAGNOSIS — E1165 Type 2 diabetes mellitus with hyperglycemia: Secondary | ICD-10-CM

## 2020-05-01 DIAGNOSIS — Z794 Long term (current) use of insulin: Secondary | ICD-10-CM

## 2020-05-01 MED ORDER — TRULICITY 0.75 MG/0.5ML ~~LOC~~ SOAJ: 0.7500 mg | SUBCUTANEOUS | 5 refills | Status: DC

## 2020-05-01 NOTE — Telephone Encounter (Signed)
Denied, see explanation below:     Per your health plan's criteria, this drug is covered if you meet the following: (1) You have tried or cannot use two preferred drugs: Byetta Pen, Trulicity Pen, Victoza Pen. The facts given does not show you have met these requirements. Please speak with the doctor about the available choices. This decision was made per the [place] GLP-1 Receptor Agonists and Combinations Guideline. *Please note: Byetta Pen, Trulicity Pen, Victoza Pen have been pre-approved through 05/01/21.

## 2020-05-01 NOTE — Progress Notes (Signed)
Previously prescribed Ozempic but insurance denied coverage. Will start Trulicity instead.

## 2020-05-01 NOTE — Telephone Encounter (Signed)
Received fax from pharmacy, PA needed on Ozempic.  Clinical questions submitted via Cover My Meds.  Waiting on response, could take up to 72 hours.  Cover My Meds info: Key: [id number]  Christen Bame, CMA

## 2020-05-05 ENCOUNTER — Ambulatory Visit: Payer: Medicaid Other | Admitting: Family Medicine

## 2020-05-22 ENCOUNTER — Other Ambulatory Visit: Payer: Self-pay

## 2020-05-22 ENCOUNTER — Ambulatory Visit (INDEPENDENT_AMBULATORY_CARE_PROVIDER_SITE_OTHER): Payer: Medicaid Other | Admitting: Family Medicine

## 2020-05-22 ENCOUNTER — Encounter: Payer: Self-pay | Admitting: Family Medicine

## 2020-05-22 VITALS — BP 122/78 | HR 77 | Ht 67.5 in | Wt 210.0 lb

## 2020-05-22 DIAGNOSIS — I209 Angina pectoris, unspecified: Secondary | ICD-10-CM | POA: Insufficient documentation

## 2020-05-22 DIAGNOSIS — I25119 Atherosclerotic heart disease of native coronary artery with unspecified angina pectoris: Secondary | ICD-10-CM | POA: Diagnosis not present

## 2020-05-22 DIAGNOSIS — E1121 Type 2 diabetes mellitus with diabetic nephropathy: Secondary | ICD-10-CM | POA: Diagnosis not present

## 2020-05-22 DIAGNOSIS — E785 Hyperlipidemia, unspecified: Secondary | ICD-10-CM | POA: Diagnosis not present

## 2020-05-22 DIAGNOSIS — F419 Anxiety disorder, unspecified: Secondary | ICD-10-CM | POA: Diagnosis present

## 2020-05-22 MED ORDER — BUSPIRONE HCL 5 MG PO TABS
7.5000 mg | ORAL_TABLET | Freq: Two times a day (BID) | ORAL | 1 refills | Status: DC
Start: 2020-05-22 — End: 2020-07-03

## 2020-05-22 NOTE — Assessment & Plan Note (Signed)
Patient with symptoms consistent with anxiety. GAD score 7, consistent with mild anxiety. Also appears there is a social component to it. Documented hx of bipolar disorder but is not on any medication for this currently. She reports that most recent diagnosis was changed to ADD, I cannot confirm this. -Start Buspar 7.5 mg BID  -Follow up made for 6/1

## 2020-05-22 NOTE — Patient Instructions (Signed)
It was wonderful to see you today!  Please bring ALL of your medications with you to every visit.   Today we talked about:  -Today I started you on a medication called Buspar. Take it twice daily to help with anxiety.  -I have referred you to Cardiology.   -If you EVER have another episode of chest pain again please go directly to the Emergency Department.   -Your referral for gastroenterology for a screening colonoscopy was sent at the last visit. Please let me know if you don't hear from them within the next month.  -Please schedule an appointment for screening mammogram and vision screening.    Thank you for choosing [facility].   Please call [phone] with any questions about today's appointment.  Please be sure to schedule follow up at the front  desk before you leave today for 2-3 weeks.   [name], DO PGY-1 Family Medicine

## 2020-05-22 NOTE — Assessment & Plan Note (Signed)
Patient with recent episode of angina.  This resolved after nitroglycerin.  She has not followed with her cardiologist for 2 years.  She was extensively counseled to go to the emergency department should she have another episode like this.  Referral placed to cardiology today.  Because this episode was 6 days ago and patient is now asymptomatic, I do not feel that further work-up is indicated today.

## 2020-05-22 NOTE — Assessment & Plan Note (Addendum)
Patient with well-controlled diabetes now, on Insulin detemir 15A daily and Trulicity 6.82 mg. CBG review within goal. Given her improved diet and exercise, discussed possibility of lowering insulin with the goal of potentially coming off of it completely. She is very pleased with her blood sugars at the moment.  -Will keep same regimen for now; adjust as necessary with time. Anticipate being able to lower insulin detemir in the near future. -Diabetic foot exam at follow-up; time did not allow for this today

## 2020-05-22 NOTE — Progress Notes (Addendum)
SUBJECTIVE:   CHIEF COMPLAINT / HPI:   Diabetes Follow Up  States that since starting the Trulicity her blood sugars have been more controlled. She has decreased her insulin to 10U daily. She has improved her diet and is drinking more smoothies, eating better and has starting walking more. She feels like her mood has improved since her sugars have improved. She was really proud of her A1c result. She is hopeful to eventually come off insluin.  Anxiety  Patient feels generally anxious. It is worse in crowds and also notes that other people confide in her. Has tried Paxil in the past. Previously told she has bipolar and then also told by another provider that she did not have it and actually had ADD.   Hx Chest Pain Patient states that on 5/6 she experienced episode of central chest pain.  Was unsure if this was related to anxiety or was cardiac.  She took a nitroglycerin which helped the pain.  States that her son advised her to go to the emergency department but she decided not to.  She had some shortness of breath with this.  The pain eventually subsided.  Today she reports no chest pain or shortness of breath.  She was previously followed by cardiologist, Dr. Ellyn Hack.  Reports she has not seen him for at least 2 years.  PERTINENT  PMH / PSH:  Past Medical History:  Diagnosis Date  . Anemia   . Atherosclerotic heart disease of native coronary artery with angina pectoris ([place]) 05/2012, 10/2013   a.  s/p PCTA to dRCA and ostial RPAV-PLA vessel + PDA branch (05/2012)  b. [place] s/p DES to mLAD - Resolute DES 3.0 x 22 (3.55mm -->3.3 mm)  . Bipolar depression ([place])   . Breast abscess    a. right side.   Marland Kitchen COPD (chronic obstructive pulmonary disease) ([place])   . Depression   . Diabetic peripheral neuropathy ([place])   . Fibromyalgia   . Genital warts   . GERD (gastroesophageal reflux disease)   . HLD (hyperlipidemia)   . Hypertension   . Pain in limb    a. LE VENOUS DUPLEX, 02/05/2009 - no  evidence of deep vein thrombosis, Baker's cyst  . PTSD (post-traumatic stress disorder)   . Sickle cell trait ([place])   . Tobacco abuse   . Tooth caries   . Type II diabetes mellitus (HCC)      OBJECTIVE:   BP 122/78   Pulse 77   Ht 5' 7.5" (1.715 m)   Wt 210 lb (95.3 kg)   SpO2 98%   BMI 32.41 kg/m    General: NAD, pleasant, able to participate in exam Cardiac: RRR, no murmurs. Respiratory: CTAB, normal effort, No wheezes, rales or rhonchi Extremities: no edema or cyanosis. Skin: warm and dry Neuro: alert, no obvious focal deficits Psych: Normal affect and mood  ASSESSMENT/PLAN:   Type II diabetes mellitus with nephropathy ([place]) Patient with well-controlled diabetes now, on Insulin detemir 09X daily and Trulicity 8.33 mg. CBG review within goal. Given her improved diet and exercise, discussed possibility of lowering insulin with the goal of potentially coming off of it completely. She is very pleased with her blood sugars at the moment.  -Will keep same regimen for now; adjust as necessary with time. Anticipate being able to lower insulin detemir in the near future. -Diabetic foot exam at follow-up; time did not allow for this today  Anxiety Patient with symptoms consistent with anxiety. GAD score 7,  consistent with mild anxiety. Also appears there is a social component to it. Documented hx of bipolar disorder but is not on any medication for this currently. She reports that most recent diagnosis was changed to ADD, I cannot confirm this. -Start Buspar 7.5 mg BID  -Follow up made for 6/1    Angina pectoris [facility]) Patient with recent episode of angina.  This resolved after nitroglycerin.  She has not followed with her cardiologist for 2 years.  She was extensively counseled to go to the emergency department should she have another episode like this.  Referral placed to cardiology today.  Because this episode was 6 days ago and patient is now asymptomatic, I do not feel that  further work-up is indicated today.     [name], [place]   \

## 2020-06-02 ENCOUNTER — Other Ambulatory Visit: Payer: Self-pay | Admitting: Obstetrics

## 2020-06-02 ENCOUNTER — Other Ambulatory Visit: Payer: Self-pay | Admitting: Family Medicine

## 2020-06-02 DIAGNOSIS — Z1231 Encounter for screening mammogram for malignant neoplasm of breast: Secondary | ICD-10-CM

## 2020-06-03 ENCOUNTER — Other Ambulatory Visit: Payer: Self-pay

## 2020-06-03 ENCOUNTER — Ambulatory Visit
Admission: RE | Admit: 2020-06-03 | Discharge: 2020-06-03 | Disposition: A | Discharge status: Home / Self Care | Payer: Medicaid Other | Source: Ambulatory Visit | Attending: Obstetrics | Admitting: Obstetrics

## 2020-06-03 DIAGNOSIS — Z1231 Encounter for screening mammogram for malignant neoplasm of breast: Secondary | ICD-10-CM

## 2020-06-11 ENCOUNTER — Encounter: Payer: Self-pay | Admitting: Family Medicine

## 2020-06-11 ENCOUNTER — Ambulatory Visit (INDEPENDENT_AMBULATORY_CARE_PROVIDER_SITE_OTHER): Payer: Medicaid Other | Admitting: Family Medicine

## 2020-06-11 ENCOUNTER — Other Ambulatory Visit: Payer: Self-pay

## 2020-06-11 VITALS — BP 151/81 | HR 77 | Ht 67.0 in | Wt 210.0 lb

## 2020-06-11 DIAGNOSIS — Z23 Encounter for immunization: Secondary | ICD-10-CM | POA: Diagnosis not present

## 2020-06-11 DIAGNOSIS — M545 Low back pain, unspecified: Secondary | ICD-10-CM

## 2020-06-11 DIAGNOSIS — F419 Anxiety disorder, unspecified: Secondary | ICD-10-CM

## 2020-06-11 DIAGNOSIS — E119 Type 2 diabetes mellitus without complications: Secondary | ICD-10-CM | POA: Insufficient documentation

## 2020-06-11 DIAGNOSIS — E785 Hyperlipidemia, unspecified: Secondary | ICD-10-CM | POA: Diagnosis not present

## 2020-06-11 DIAGNOSIS — M549 Dorsalgia, unspecified: Secondary | ICD-10-CM | POA: Insufficient documentation

## 2020-06-11 DIAGNOSIS — Z794 Long term (current) use of insulin: Secondary | ICD-10-CM | POA: Diagnosis not present

## 2020-06-11 MED ORDER — FLUTICASONE PROPIONATE 50 MCG/ACT NA SUSP: 1.0000 | Freq: Every day | NASAL | 3 refills | Status: DC | PRN

## 2020-06-11 MED ORDER — BACLOFEN 10 MG PO TABS: 10.0000 mg | ORAL_TABLET | Freq: Three times a day (TID) | ORAL | 0 refills | Status: DC

## 2020-06-11 NOTE — Progress Notes (Signed)
SUBJECTIVE:   CHIEF COMPLAINT / HPI:   Diabetes Diabetes, Type 2 - Last A1c 6.8 - Medications: Insulin Detemir 7U, Trulicity 0.86 mg/week - Compliance: Taking medicine daily - Checking BG at home: Daily - Diet: Doesn't have a great appetite, still doing smoothies   - Exercise: Walks the track twice a week, does sit-ups - Eye exam: Scheduled for tomorrow 6/2 - Foot exam: Has never had - Microalbumin: Normal and up to date, next due 04/2021 - Statin: Atorvastatin  - Denies symptoms of hypoglycemia, polyuria, polydipsia, numbness extremities, foot ulcers/trauma  Anxiety Follow Up Started on Buspar at last visit.  States that since starting the pill she has had no crying spells, doing better. Not getting stuck in her thoughts.  No adverse side effects.  Acute on chronic back Pain Notes longstanding history of back pain.  States it is worsened within the last year and also worse within the last week.  She believes that it was due to cooking for the recent holiday [facility]).  Denies any recent trauma or injury.  She typically uses Voltaren gel for this, tends to work well.  Wondering if there is anything else she can do for this recent flare.  She has been seen by pain management in the past, unable to for those visits now.    PERTINENT  PMH / PSH:  Past Medical History:  Diagnosis Date  . Anemia   . Atherosclerotic heart disease of native coronary artery with angina pectoris ([place]) 05/2012, 10/2013   a.  s/p PCTA to dRCA and ostial RPAV-PLA vessel + PDA branch (05/2012)  b. [place] s/p DES to mLAD - Resolute DES 3.0 x 22 (3.45mm -->3.3 mm)  . Bipolar depression ([place])   . Breast abscess    a. right side.   Marland Kitchen COPD (chronic obstructive pulmonary disease) ([place])   . Depression   . Diabetic peripheral neuropathy ([place])   . Fibromyalgia   . Genital warts   . GERD (gastroesophageal reflux disease)   . HLD (hyperlipidemia)   . Hypertension   . Pain in limb    a. LE VENOUS DUPLEX,  02/05/2009 - no evidence of deep vein thrombosis, Baker's cyst  . PTSD (post-traumatic stress disorder)   . Sickle cell trait ([place])   . Tobacco abuse   . Tooth caries   . Type II diabetes mellitus (HCC)      OBJECTIVE:   BP (!) 151/81   Pulse 77   Ht 5\' 7"  (1.702 m)   Wt 210 lb (95.3 kg)   SpO2 99%   BMI 32.89 kg/m    General: NAD, pleasant, able to participate in exam Cardiac: RRR, no murmurs. Respiratory: CTAB, normal effort, No wheezes, rales or rhonchi Extremities: no edema or cyanosis. Skin: warm and dry, no rashes noted Back: Tenderness to palpation upper thoracic and lower lumbar spine. Tenderness across lower back. Decreased ROM, mobility is somewhat limited by pain when walking  Psych: Normal affect and mood  ASSESSMENT/PLAN:   Type 2 diabetes mellitus without complications (HCC) Currently on Trulicity and insulin Detmemir. Last A1c 6.8.  She feels as though she has lost some weight with the Trulicity medication, is happy with this, and has had good control of her CBGs.  She brings a log with her which I reviewed, blood glucose seems to be around 110's.  She is currently taking 7 units of her detemir.  We discussed stopping detemir and continuing on Trulicity.  We have room to  increase her Trulicity to 1.5 mg/week if her CBG's remain elevated after stopping Detemir. -Stop Insulin Detemir  -Continue Trulicity 9.57 mg/week -Will consider increasing Trulicity to 1.5 mg/week    Anxiety Started on BuSpar last appointment.  She is tolerating this medication well and feels that her anxiety is more controlled.  No adverse side effects. -Continue BuSpar  Back pain Acute on chronic back pain.  Feels that recent flare is due to increased activity recently.  Typically uses Voltaren gel lidocaine patches to the area.  She was previously seen in pain clinic where she received injections, though states this was done to the upper part of her back.  We discussed doing PT.  She has done  this in the past, not amenable at this time.  We discussed doing stretches to help her back.  No acute trauma or injury, do not feel that imaging is necessary at this time. No red flag symptoms.  -Recommend stretches to help -Recommend exercise/weight loss -Rx baclofen to help with acute flare  Healthcare maintenance  TDap today.   [name], [place]

## 2020-06-11 NOTE — Patient Instructions (Signed)
It was wonderful to see you today.  Please bring ALL of your medications with you to every visit.   Today we talked about:  Cutting back on your Detemir. Stop it for 1-week and just take your Trulicity. We will see how your blood sugars do without your Insulin Detemir. We can go up on your Trulicity to 1.5 mg/week after this if your blood sugars are above 150.   I'm glad that your anxiety is doing much better since starting the medication!  I've given you a prescription for a muscle relaxer to help your back.  Keep using Voltaren gel and heat pads to the area.  I recommend doing back stretches as well.  Today you received your Tdap vaccine and we rechecked her cholesterol.  I will let you know the results.   Thank you for choosing [facility].   Please call [phone] with any questions about today's appointment.  Please be sure to schedule follow up at the front  desk before you leave today.   [name], DO PGY-1 Family Medicine    Chronic Back Pain When back pain lasts longer than 3 months, it is called chronic back pain. Pain may get worse at certain times (flare-ups). There are things you can do at home to manage your pain. Follow these instructions at home: Pay attention to any changes in your symptoms. Take these actions to help with your pain: Managing pain and stiffness  If told, put ice on the painful area. Your doctor may tell you to use ice for 24-48 hours after the flare-up starts. To do this: ? Put ice in a plastic bag. ? Place a towel between your skin and the bag. ? Leave the ice on for 20 minutes, 2-3 times a day.  If told, put heat on the painful area. Do this as often as told by your doctor. Use the heat source that your doctor recommends, such as a moist heat pack or a heating pad. ? Place a towel between your skin and the heat source. ? Leave the heat on for 20-30 minutes. ? Take off the heat if your skin turns bright red. This is  especially important if you are unable to feel pain, heat, or cold. You may have a greater risk of getting burned.  Soak in a warm bath. This can help relieve pain.      Activity  Avoid bending and other activities that make pain worse.  When standing: ? Keep your upper back and neck straight. ? Keep your shoulders pulled back. ? Avoid slouching.  When sitting: ? Keep your back straight. ? Relax your shoulders. Do not round your shoulders or pull them backward.  Do not sit or stand in one place for long periods of time.  Take short rest breaks during the day. Lying down or standing is usually better than sitting. Resting can help relieve pain.  When sitting or lying down for a long time, do some mild activity or stretching. This will help to prevent stiffness and pain.  Get regular exercise. Ask your doctor what activities are safe for you.  Do not lift anything that is heavier than 10 lb (4.5 kg) or the limit that you are told, until your doctor says that it is safe.  To prevent injury when you lift things: ? Bend your knees. ? Keep the weight close to your body. ? Avoid twisting.  Sleep on a firm mattress. Try lying on your side with  your knees slightly bent. If you lie on your back, put a pillow under your knees.   Medicines  Treatment may include medicines for pain and swelling taken by mouth or put on the skin, prescription pain medicine, or muscle relaxants.  Take over-the-counter and prescription medicines only as told by your doctor.  Ask your doctor if the medicine prescribed to you: ? Requires you to avoid driving or using machinery. ? Can cause trouble pooping (constipation). You may need to take these actions to prevent or treat trouble pooping:  Drink enough fluid to keep your pee (urine) pale yellow.  Take over-the-counter or prescription medicines.  Eat foods that are high in fiber. These include beans, whole grains, and fresh fruits and  vegetables.  Limit foods that are high in fat and sugars. These include fried or sweet foods. General instructions  Do not use any products that contain nicotine or tobacco, such as cigarettes, e-cigarettes, and chewing tobacco. If you need help quitting, ask your doctor.  Keep all follow-up visits as told by your doctor. This is important. Contact a doctor if:  Your pain does not get better with rest or medicine.  Your pain gets worse, or you have new pain.  You have a high fever.  You lose weight very quickly.  You have trouble doing your normal activities. Get help right away if:  One or both of your legs or feet feel weak.  One or both of your legs or feet lose feeling (have numbness).  You have trouble controlling when you poop (have a bowel movement) or pee (urinate).  You have bad back pain and: ? You feel like you may vomit (nauseous), or you vomit. ? You have pain in your belly (abdomen). ? You have shortness of breath. ? You faint. Summary  When back pain lasts longer than 3 months, it is called chronic back pain.  Pain may get worse at certain times (flare-ups).  Use ice and heat as told by your doctor. Your doctor may tell you to use ice after flare-ups. This information is not intended to replace advice given to you by your health care provider. Make sure you discuss any questions you have with your health care provider. Document Revised: 02/07/2019 Document Reviewed: 02/07/2019 Elsevier Patient Education  2021 [employer].

## 2020-06-11 NOTE — Assessment & Plan Note (Signed)
Started on BuSpar last appointment.  She is tolerating this medication well and feels that her anxiety is more controlled.  No adverse side effects. -Continue BuSpar

## 2020-06-11 NOTE — Addendum Note (Signed)
Addended by: [name] on: 06/11/2020 04:56 PM   Modules accepted: Orders

## 2020-06-11 NOTE — Assessment & Plan Note (Signed)
Currently on Trulicity and insulin Detmemir. Last A1c 6.8.  She feels as though she has lost some weight with the Trulicity medication, is happy with this, and has had good control of her CBGs.  She brings a log with her which I reviewed, blood glucose seems to be around 110's.  She is currently taking 7 units of her detemir.  We discussed stopping detemir and continuing on Trulicity.  We have room to increase her Trulicity to 1.5 mg/week if her CBG's remain elevated after stopping Detemir. -Stop Insulin Detemir  -Continue Trulicity 2.34 mg/week -Will consider increasing Trulicity to 1.5 mg/week

## 2020-06-11 NOTE — Assessment & Plan Note (Signed)
Acute on chronic back pain.  Feels that recent flare is due to increased activity recently.  Typically uses Voltaren gel lidocaine patches to the area.  She was previously seen in pain clinic where she received injections, though states this was done to the upper part of her back.  We discussed doing PT.  She has done this in the past, not amenable at this time.  We discussed doing stretches to help her back.  No acute trauma or injury, do not feel that imaging is necessary at this time. No red flag symptoms.  -Recommend stretches to help -Recommend exercise/weight loss -Rx baclofen to help with acute flare

## 2020-06-12 LAB — LIPID PANEL
Chol/HDL Ratio: 2.3 ratio (ref 0.0–4.4)
Cholesterol, Total: 126 mg/dL (ref 100–199)
HDL: 54 mg/dL (ref >39)
LDL Chol Calc (NIH): 55 mg/dL (ref 0–99)
Triglycerides: 90 mg/dL (ref 0–149)
VLDL Cholesterol Cal: 17 mg/dL (ref 5–40)

## 2020-06-16 ENCOUNTER — Telehealth: Payer: Self-pay

## 2020-06-16 NOTE — Telephone Encounter (Signed)
Patient calls nurse line stating she needs PCP to send in new dose of Trulicity. Patient reports her dose was increased at last OV to 1.5mg . I see in office note Trulicity was not specifically increased at 6/1 visit, however it may be an option in the furture. Please advise on plan and if meant to increase please send new script to pharmacy.

## 2020-06-16 NOTE — Telephone Encounter (Signed)
Called patient. Discussed staying at 2.19 of Trulicity currently and seeing how her sugars are this week off of the Detemir. She states her glucose was 108 this morning before giving herself Trulucity so will not increase at this time.

## 2020-06-25 DIAGNOSIS — N3020 Other chronic cystitis without hematuria: Secondary | ICD-10-CM | POA: Diagnosis not present

## 2020-06-25 DIAGNOSIS — N3941 Urge incontinence: Secondary | ICD-10-CM | POA: Diagnosis not present

## 2020-07-02 ENCOUNTER — Other Ambulatory Visit: Payer: Self-pay | Admitting: Family Medicine

## 2020-07-02 DIAGNOSIS — F419 Anxiety disorder, unspecified: Secondary | ICD-10-CM

## 2020-07-10 DIAGNOSIS — E119 Type 2 diabetes mellitus without complications: Secondary | ICD-10-CM | POA: Diagnosis not present

## 2020-07-10 DIAGNOSIS — H2513 Age-related nuclear cataract, bilateral: Secondary | ICD-10-CM | POA: Diagnosis not present

## 2020-07-10 LAB — HM DIABETES EYE EXAM

## 2020-07-20 NOTE — Progress Notes (Signed)
SUBJECTIVE:   CHIEF COMPLAINT / HPI:   Abdominal Pain  Patient presents today for follow-up on episode of abdominal pain that she experienced on July 3.  States that the pain awoke her from sleep around 3 AM, states her stomach was crumbly with pain.  She describes the pain as feeling like a knife was in there and turning.  She had 3 episodes of diarrhea that accompany this and vomiting.  She felt fatigued for the next couple days.  She reports that she went to the emergency department but it was too packed and so she left before being seen.  She figured that the urgent care would also be packed so did not present.  She took Tylenol and Pepto-Bismol for this.  Reports possible blood in her stool tea colored.  Has not had a bowel movement since July 5.  No abdominal pain today.  She states that she still has not heard from the gastroenterologist.  Type 2 Diabetes Mellitus Diabetes, Type 2 - Last A1c 6.8 - Medications: Trulicity 0.35 mg/week - Compliance: Taking medicine weekly, was late by 1 day last week due to her abdominal pain - Checking BG at home: Daily, lowest was 116 highest 130's. - Diet: Not eating as much now, reports not eating sweets. Weight is stable, she is happy about this.  - Exercise: Walks the track twice a week, does sit-ups (able to do 8 now) - Eye exam: Up to date, had appointment 6/2 - Foot exam: At next check in 73-months when she is due for Hgb A1c - Microalbumin: Normal and up to date, on ARB - Statin: Atorvastatin   HTN Reports not taking her blood pressure medications this morning as she was rushing to her earlier doctor appointment.  She reports being stressed today and dealing with some personal stressors that she does not go into detail about.  She believes that this contributed to her elevated blood pressures.  She does check her blood pressures at home and reports them being in 140's. She also reports being out of her carvedilol. No chest pain or SOB.    Back Pain Chronic. Is doing stretches. Had great relief with Baclofen and is wondering if she can have a refill.  Also is getting great relief with Voltaren gel.  Anxiety On bupropion.  She has not yet called for therapy/counseling but plans to do so soon.  PERTINENT  PMH / PSH:  Past Medical History:  Diagnosis Date   Anemia    Atherosclerotic heart disease of native coronary artery with angina pectoris ([place]) 05/2012, 10/2013   a.  s/p PCTA to dRCA and ostial RPAV-PLA vessel + PDA branch (05/2012)  b. [place] s/p DES to mLAD - Resolute DES 3.0 x 22 (3.58mm -->3.3 mm)   Bipolar depression ([place])    Breast abscess    a. right side.    COPD (chronic obstructive pulmonary disease) (HCC)    Depression    Diabetic peripheral neuropathy (HCC)    Fibromyalgia    Genital warts    GERD (gastroesophageal reflux disease)    HLD (hyperlipidemia)    Hypertension    Pain in limb    a. LE VENOUS DUPLEX, 02/05/2009 - no evidence of deep vein thrombosis, Baker's cyst   PTSD (post-traumatic stress disorder)    Sickle cell trait (HCC)    Tobacco abuse    Tooth caries    Type II diabetes mellitus ([place])      OBJECTIVE:   BP Marland Kitchen)  188/93   Pulse 95   Wt 210 lb 3.2 oz (95.3 kg)   SpO2 99%   BMI 32.92 kg/m   BP recheck: 166/74, HR 84  General: NAD, pleasant, able to participate in exam Cardiac: RRR, no murmurs. Respiratory: CTAB, normal effort, No wheezes, rales or rhonchi Abdomen: Bowel sounds present, mild tenderness LUQ and LLQ without rebound or guarding, nondistended, no hepatosplenomegaly. Extremities: no edema or cyanosis. Skin: warm and dry, no rashes noted Psych: Normal affect and mood  ASSESSMENT/PLAN:   Essential hypertension Did not take medications this morning but typically compliant. On 50 mg of losartan and coreg 6.25 mg BID. Refilled coreg. Asymptomatic.  -Continue home medications  Abdominal pain Isolated episode that has since resolved.  Unfortunately, patient was not  evaluated for this as she left ED due to long waits. Reassuring that this self-resolved. Question whether this was related to gas vs constipation. No BM x1 week, this is concerning. Recommended MiraLAX daily until she has regular bowel movements and then can space-out as needed. She was given information for gastroenterology to contact for appointment.  She needs screening colonoscopy as she has not had one yet. Question her report of bloody stool given it was tea-colored, she is due for colonoscopy anyway which would help with further assessment.   Type 2 diabetes mellitus without complications (HCC) Stable and doing well. CBG log with all readings <150. Weight stable. Off insulin.  -Continue Trulicity -F/u in 3 months for Hgb A1c -Diabetic foot exam at f/u   Back pain Doing well with Voltaren gel, back stretches.  Also requested refill of baclofen.  Discussed that I will provide refill but to only use as needed. Encouraged continued stretches.   Anxiety Continue bupropion.  Encouraged her to call for therapy.     [name], [place]

## 2020-07-21 ENCOUNTER — Ambulatory Visit (INDEPENDENT_AMBULATORY_CARE_PROVIDER_SITE_OTHER): Payer: Medicaid Other | Admitting: Family Medicine

## 2020-07-21 ENCOUNTER — Encounter: Payer: Self-pay | Admitting: Family Medicine

## 2020-07-21 ENCOUNTER — Other Ambulatory Visit: Payer: Self-pay

## 2020-07-21 DIAGNOSIS — M545 Low back pain, unspecified: Secondary | ICD-10-CM | POA: Diagnosis not present

## 2020-07-21 DIAGNOSIS — N3020 Other chronic cystitis without hematuria: Secondary | ICD-10-CM | POA: Diagnosis not present

## 2020-07-21 DIAGNOSIS — I10 Essential (primary) hypertension: Secondary | ICD-10-CM

## 2020-07-21 DIAGNOSIS — E119 Type 2 diabetes mellitus without complications: Secondary | ICD-10-CM

## 2020-07-21 DIAGNOSIS — R109 Unspecified abdominal pain: Secondary | ICD-10-CM | POA: Diagnosis not present

## 2020-07-21 DIAGNOSIS — F419 Anxiety disorder, unspecified: Secondary | ICD-10-CM | POA: Diagnosis not present

## 2020-07-21 MED ORDER — CARVEDILOL 6.25 MG PO TABS: 6.2500 mg | ORAL_TABLET | Freq: Two times a day (BID) | ORAL | 11 refills | Status: DC

## 2020-07-21 MED ORDER — BACLOFEN 10 MG PO TABS: 10.0000 mg | ORAL_TABLET | ORAL | 0 refills | Status: DC | PRN

## 2020-07-21 MED ORDER — POLYETHYLENE GLYCOL 3350 17 GM/SCOOP PO POWD: 17.0000 g | Freq: Every day | ORAL | 1 refills | Status: DC

## 2020-07-21 NOTE — Assessment & Plan Note (Signed)
Doing well with Voltaren gel, back stretches.  Also requested refill of baclofen.  Discussed that I will provide refill but to only use as needed. Encouraged continued stretches.

## 2020-07-21 NOTE — Assessment & Plan Note (Signed)
Isolated episode that has since resolved.  Unfortunately, patient was not evaluated for this as she left ED due to long waits. Reassuring that this self-resolved. Question whether this was related to gas vs constipation. No BM x1 week, this is concerning. Recommended MiraLAX daily until she has regular bowel movements and then can space-out as needed. She was given information for gastroenterology to contact for appointment.  She needs screening colonoscopy as she has not had one yet. Question her report of bloody stool given it was tea-colored, she is due for colonoscopy anyway which would help with further assessment.

## 2020-07-21 NOTE — Patient Instructions (Addendum)
It was wonderful to see you today.  Please bring ALL of your medications with you to every visit.   Today we talked about:  You can use MiraLAX for constipation.   Please call [facility] for an appointment [address], [place], [place] [id number] [phone]  I have refilled your Carvedilol. Check your blood pressure at home. I have re-sent a prescription for the muscle relaxer- only use this as needed when your muscle spasms are really severe. Please continue to do stretches!   Thank you for choosing [facility].   Please call [phone] with any questions about today's appointment.  Please be sure to schedule follow up at the front  desk before you leave today.   [name], DO PGY-2 Family Medicine

## 2020-07-21 NOTE — Assessment & Plan Note (Signed)
Did not take medications this morning but typically compliant. On 50 mg of losartan and coreg 6.25 mg BID. Refilled coreg. Asymptomatic.  -Continue home medications

## 2020-07-21 NOTE — Assessment & Plan Note (Signed)
Stable and doing well. CBG log with all readings <150. Weight stable. Off insulin.  -Continue Trulicity -F/u in 3 months for Hgb A1c -Diabetic foot exam at f/u

## 2020-07-21 NOTE — Assessment & Plan Note (Signed)
Continue bupropion.  Encouraged her to call for therapy.

## 2020-07-30 ENCOUNTER — Ambulatory Visit: Payer: Medicaid Other | Admitting: Cardiology

## 2020-08-21 ENCOUNTER — Other Ambulatory Visit: Payer: Self-pay | Admitting: Family Medicine

## 2020-08-21 DIAGNOSIS — H5213 Myopia, bilateral: Secondary | ICD-10-CM | POA: Diagnosis not present

## 2020-09-25 ENCOUNTER — Other Ambulatory Visit: Payer: Self-pay

## 2020-09-25 ENCOUNTER — Ambulatory Visit: Payer: Medicaid Other | Admitting: Cardiology

## 2020-09-25 ENCOUNTER — Other Ambulatory Visit: Payer: Self-pay | Admitting: Family Medicine

## 2020-09-25 DIAGNOSIS — E1165 Type 2 diabetes mellitus with hyperglycemia: Secondary | ICD-10-CM

## 2020-09-25 DIAGNOSIS — H524 Presbyopia: Secondary | ICD-10-CM | POA: Diagnosis not present

## 2020-10-06 NOTE — Progress Notes (Signed)
Cardiology Office Note:   Date:  10/07/2020  NAME:  Cassidy [name]    MRN: [id number] DOB:  [birth year]   PCP:  [name], DO  Cardiologist:  None  Electrophysiologist:  None   Referring MD: Zenia Resides, MD   Chief Complaint  Patient presents with   Coronary Artery Disease    History of Present Illness:   Cassidy [name] is a 55 y.o. female with a hx of CAD, DM, HTN, HLD who is being seen today for the evaluation of CAD at the request of [name], DO.  She has not followed with cardiology in several years.  Reports she is had episodes of chest pain.  She reports she had 3 episodes of sharp chest pain left chest.  It occurs randomly.  She reports it is occurred while standing in the kitchen.  No identifiable trigger.  She reports symptoms last 20 minutes to up to 1 hour.  She reports she feels faint and dizzy.  She also gets sweaty.  She reports nitroglycerin partially improves her symptoms.  She improves with relaxation as well.  She reports that she walks 3 miles 3 times per week.  No chest pain or pressure when she walks.  Her symptoms occur while she is standing.  She does not get excessively short of breath.  No lower extremity edema.  Her blood pressure is 140/72.  This is well controlled today in office.  She is diabetic but her A1c is well controlled.  LDL cholesterol 55.  She is on aspirin as well as Lipitor 40 mg daily.  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  She is still smoking 1/2 pack/day.  She has done so for roughly 40 years.  Family history is notable for heart disease in her family.  She is disabled.  She is not drink alcohol or use drugs.  She has 1 son.  No grandchildren.  All other risk factors appear to be well controlled.  Problem List CAD -2014: POBA dRCA/PDA/PLV -2016: PCI to mid LAD  2. HLD -T chol 126, HDL 54, LDL 55, TG 90 3. DM -A1c 6.8 4. HTN 5. Tobacco abuse  -40 pack years  Past Medical History: Past Medical History:   Diagnosis Date   Anemia    Atherosclerotic heart disease of native coronary artery with angina pectoris ([place]) 05/2012, 10/2013   a.  s/p PCTA to dRCA and ostial RPAV-PLA vessel + PDA branch (05/2012)  b. [place] s/p DES to mLAD - Resolute DES 3.0 x 22 (3.93mm -->3.3 mm)   Bipolar depression ([place])    Breast abscess    a. right side.    COPD (chronic obstructive pulmonary disease) (HCC)    Depression    Diabetic peripheral neuropathy (HCC)    Fibromyalgia    Genital warts    GERD (gastroesophageal reflux disease)    HLD (hyperlipidemia)    Hypertension    Pain in limb    a. LE VENOUS DUPLEX, 02/05/2009 - no evidence of deep vein thrombosis, Baker's cyst   PTSD (post-traumatic stress disorder)    Sickle cell trait (HCC)    Tobacco abuse    Tooth caries    Type II diabetes mellitus ([place])     Past Surgical History: Past Surgical History:  Procedure Laterality Date   BLADDER SURGERY  1980   "TVT"   BREAST EXCISIONAL BIOPSY     BREAST SURGERY Right    I&D for multiple abscesses   CARDIAC CATHETERIZATION  cutting balloon     EYE SURGERY Left    laser surgery   INCISE AND DRAIN ABCESS  ; 04/26/11; 08/13/11   right breast   INCISION AND DRAINAGE ABSCESS Right 05/09/2012   Procedure: INCISION AND DRAINAGE RIGHT BREAST ABSCESS;  Surgeon: Imogene Burn. Georgette Dover, MD;  Location: [place];  Service: General;  Laterality: Right;   INCISION AND DRAINAGE ABSCESS Right 02/08/2014   Procedure: INCISION AND DRAINAGE RIGHT BREAST ABSCESS;  Surgeon: Donnie Mesa, MD;  Location: [place];  Service: General;  Laterality: Right;   INCISION AND DRAINAGE ABSCESS Right 06/14/2014   Procedure: INCISION AND DRAINAGE RIGHT BREAST ABSCESS;  Surgeon: Donnie Mesa, MD;  Location: [place];  Service: General;  Laterality: Right;   IRRIGATION AND DEBRIDEMENT ABSCESS  12/19/2010   Procedure: IRRIGATION AND DEBRIDEMENT ABSCESS;  Surgeon: Rolm Bookbinder, MD;  Location: [place];  Service: General;  Laterality: Right;   IRRIGATION  AND DEBRIDEMENT ABSCESS  08/13/2011   Procedure: MINOR INCISION AND DRAINAGE OF ABSCESS;  Surgeon: Gwenyth Ober, MD;  Location: [place];  Service: General;  Laterality: Right;  Right Breast    IRRIGATION AND DEBRIDEMENT ABSCESS  11/17/2011   Procedure: IRRIGATION AND DEBRIDEMENT ABSCESS;  Surgeon: Imogene Burn. Georgette Dover, MD;  Location: [place];  Service: General;  Laterality: Right;  irrigation and debridement right recurrent breast abscess   LARYNX SURGERY     LEFT HEART CATHETERIZATION WITH CORONARY ANGIOGRAM N/A 05/29/2012   Procedure: LEFT HEART CATHETERIZATION WITH CORONARY ANGIOGRAM & PTCA;  Surgeon: Troy Sine, MD;  Location: [facility] CATH LAB;  Service: Cardiovascular; Bifurcation dRCA-RPAD/PLA & rPDA  PTCA.   LEFT HEART CATHETERIZATION WITH CORONARY ANGIOGRAM N/A 11/08/2013   Procedure: LEFT HEART CATHETERIZATION WITH CORONARY ANGIOGRAM and Coronary Stent Intervention;  Surgeon: Leonie Man, MD;  Location: [facility] CATH LAB;  Service: Cardiovascular; mLAD 80% (FFR 0.73) - resolute DES 3.0 x 22 mm (postdilated to 3.3 mm->3.5 mm)   NM MYOVIEW LTD  01/26/2009   Normal study, no evidence of ischemia, EF 67%   ployp removed from voice box 03/30/12     REFRACTIVE SURGERY  ~ 2010   right   TONSILLECTOMY  1988    Current Medications:    Allergies:    Amoxicillin and Buprenorphine hcl-naloxone hcl   Social History: Social History   Socioeconomic History   Marital status: Single    Spouse name: Not on file   Number of children: 1   Years of education: Not on file   Highest education level: Not on file  Occupational History   Occupation: homemaker   Occupation: disabled  Tobacco Use   Smoking status: Every Day    Packs/day: 0.50    Years: 40.00    Pack years: 20.00    Types: Cigarettes   Smokeless tobacco: Never   Tobacco comments:    08/14/11 "quit for 3 wk 05/2011; was smoking 1 ppd since age 70 before I quit; at least I've cut down to 1/4 ppd"  Substance and Sexual Activity   Alcohol  use: No    Alcohol/week: 0.0 standard drinks    Comment: recovering addict; 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, marijuana, crack cocaine)   Drug use: Yes    Comment: 08/14/11 'quit everything in 2005"   Sexual activity: Yes    Partners: Male    Birth control/protection: Condom  Other Topics Concern   Not on file  Social History Narrative   Work: disability   Children: son, 6 yrs old   Regular  exercise: some/ heart attack in May/ walks 1 mile 3 days a week   Caffeine use: daily, cup of coffee daily   Social Determinants of Health   Financial Resource Strain: Not on file  Food Insecurity: Not on file  Transportation Needs: Not on file  Physical Activity: Not on file  Stress: Not on file  Social Connections: Not on file     Family History: The patient's family history includes COPD in her mother; Cancer in her maternal aunt and mother; Crohn's disease in her maternal aunt; Diabetes in her maternal grandmother; Heart disease in her father and mother; Hyperlipidemia in her father; Hypertension in her brother and maternal grandmother; Stomach cancer in her mother. There is no history of Esophageal cancer, Rectal cancer, or Liver cancer.  ROS:   All other ROS reviewed and negative. Pertinent positives noted in the HPI.     EKGs/Labs/Other Studies Reviewed:   The following studies were personally reviewed by me today:  EKG:  EKG is  ordered today.  The ekg ordered today demonstrates normal sinus rhythm heart rate 75, old anteroseptal infarct, no acute ischemic changes, and was personally reviewed by me.   Recent Labs: 04/24/2020: BUN 11; Creatinine, Ser 0.91; Potassium 4.0; Sodium 142   Recent Lipid Panel    Component Value Date/Time   CHOL 126 06/11/2020 1009   CHOL 132 08/07/2012 0905   TRIG 90 06/11/2020 1009   TRIG 73 08/07/2012 0905   HDL 54 06/11/2020 1009   HDL 42 08/07/2012 0905   CHOLHDL 2.3 06/11/2020 1009   CHOLHDL 4 11/29/2014 1140   VLDL 23.8 11/29/2014 1140   LDLCALC 55  06/11/2020 1009   LDLCALC 75 08/07/2012 0905    Physical Exam:   VS:  BP 140/72   Pulse 75   Ht 5\' 7"  (1.702 m)   Wt 208 lb 3.2 oz (94.4 kg)   SpO2 98%   BMI 32.61 kg/m    Wt Readings from Last 3 Encounters:  10/07/20 208 lb 3.2 oz (94.4 kg)  07/21/20 210 lb 3.2 oz (95.3 kg)  06/11/20 210 lb (95.3 kg)    General: Well nourished, well developed, in no acute distress Head: Atraumatic, normal size  Eyes: PEERLA, EOMI  Neck: Supple, no JVD Endocrine: No thryomegaly Cardiac: Normal S1, S2; RRR; no murmurs, rubs, or gallops Lungs: Clear to auscultation bilaterally, no wheezing, rhonchi or rales  Abd: Soft, nontender, no hepatomegaly  Ext: No edema, pulses 2+ Musculoskeletal: No deformities, BUE and BLE strength normal and equal Skin: Warm and dry, no rashes   Neuro: Alert and oriented to person, place, time, and situation, CNII-XII grossly intact, no focal deficits  Psych: Normal mood and affect   ASSESSMENT:   Cassidy [name] is a 55 y.o. female who presents for the following: 1. Chest pain of uncertain etiology   2. Coronary artery disease involving native coronary artery of native heart without angina pectoris   3. Mixed hyperlipidemia   4. Primary hypertension   5. Tobacco abuse   6. Chest pain, unspecified type     PLAN:   1. Chest pain of uncertain etiology -New symptoms of sharp chest pain.  Occurs at rest.  Does not occur with exertion.  She can walk 3 miles with no chest pain symptoms.  Her EKG shows an old anteroseptal infarct with no evidence of infarction.  This matches her prior MI with PCI to mid LAD.  She did have balloon angioplasty to the distal RCA in  2014 as well.  She is had no recurrent evaluation.  She does report some stress in her life and I wonder if this is the culprit here.  Her symptoms are possibly cardiac.  To further evaluate I recommended an exercise nuclear stress test.  We will see if there is any change.  It is reassuring that her cardiovascular  examination is normal.  I did encourage her to seek emergency medical care if symptoms occur and last and do not respond to nitroglycerin.  She reports sweatiness when she has the episodes.  I wonder if she is having vasovagal symptoms.  She reports that she is often standing on regular car.  She is never passed out.  2. Coronary artery disease involving native coronary artery of native heart without angina pectoris 3. Mixed hyperlipidemia -Prior history of angioplasty to the distal RCA.  This was in 2014.  Underwent drug-eluting stent to the mid LAD in 2016.  She is on aspirin 81 mg daily.  She is on Lipitor 40 mg daily.  Most recent LDL is less than 70 which is at goal. -She is on a beta-blocker.  She does have new chest pain symptoms but I am on sure if they are related to CAD.  She will undergo an exercise stress test to see if there is any change.  The mainstay to further titrate antianginal therapy.  She has no symptoms when she exerts herself, walking 3 miles without limitations.  4. Primary hypertension -Well-controlled today.  Continue Coreg 6.25 mg twice daily and losartan 50 mg daily.  5. Tobacco abuse -4 minutes of smoking cessation counseling was provided in office today.  Shared Decision Making/Informed Consent The risks [chest pain, shortness of breath, cardiac arrhythmias, dizziness, blood pressure fluctuations, myocardial infarction, stroke/transient ischemic attack, nausea, vomiting, allergic reaction, radiation exposure, metallic taste sensation and life-threatening complications (estimated to be 1 in 10,000)], benefits (risk stratification, diagnosing coronary artery disease, treatment guidance) and alternatives of a nuclear stress test were discussed in detail with Cassidy [name] and she agrees to proceed.  Disposition: Return in about 6 months (around 04/06/2021).  Medication Adjustments/Labs and Tests Ordered: Current medicines are reviewed at length with the patient today.   Concerns regarding medicines are outlined above.  Orders Placed This Encounter  Procedures   MYOCARDIAL PERFUSION IMAGING   No orders of the defined types were placed in this encounter.   Patient Instructions  Medication Instructions:  The current medical regimen is effective;  continue present plan and medications.  *If you need a refill on your cardiac medications before your next appointment, please call your pharmacy*   Testing/Procedures: Your physician has requested that you have en exercise stress myoview. For further information please visit [email or web address]. Please follow instruction sheet, as given.   Follow-Up: At [facility], you and your health needs are our priority.  As part of our continuing mission to provide you with exceptional heart care, we have created designated Provider Care Teams.  These Care Teams include your primary Cardiologist (physician) and Advanced Practice Providers (APPs -  Physician Assistants and Nurse Practitioners) who all work together to provide you with the care you need, when you need it.  We recommend signing up for the patient portal called "MyChart".  Sign up information is provided on this After Visit Summary.  MyChart is used to connect with patients for Virtual Visits (Telemedicine).  Patients are able to view lab/test results, encounter notes, upcoming appointments, etc.  Non-urgent messages can be sent  to your provider as well.   To learn more about what you can do with MyChart, go to [email or web address].    Your next appointment:   6 month(s)  The format for your next appointment:   In Person  Provider:   Eleonore Chiquito, MD     Signed, Addison Naegeli. Audie Box, MD, [place]  [address] [place], [place] [address] [phone]  10/07/2020 4:25 PM

## 2020-10-07 ENCOUNTER — Encounter: Payer: Self-pay | Admitting: Cardiovascular Disease

## 2020-10-07 ENCOUNTER — Other Ambulatory Visit: Payer: Self-pay

## 2020-10-07 ENCOUNTER — Ambulatory Visit (INDEPENDENT_AMBULATORY_CARE_PROVIDER_SITE_OTHER): Payer: Medicaid Other | Admitting: Cardiovascular Disease

## 2020-10-07 VITALS — BP 140/72 | HR 75 | Ht 67.0 in | Wt 208.2 lb

## 2020-10-07 DIAGNOSIS — E782 Mixed hyperlipidemia: Secondary | ICD-10-CM | POA: Diagnosis not present

## 2020-10-07 DIAGNOSIS — Z720 Tobacco use: Secondary | ICD-10-CM

## 2020-10-07 DIAGNOSIS — I2510 Atherosclerotic heart disease of native coronary artery without angina pectoris: Secondary | ICD-10-CM | POA: Diagnosis not present

## 2020-10-07 DIAGNOSIS — I10 Essential (primary) hypertension: Secondary | ICD-10-CM | POA: Diagnosis not present

## 2020-10-07 DIAGNOSIS — R079 Chest pain, unspecified: Secondary | ICD-10-CM

## 2020-10-07 NOTE — Patient Instructions (Signed)
Medication Instructions:  The current medical regimen is effective;  continue present plan and medications.  *If you need a refill on your cardiac medications before your next appointment, please call your pharmacy*   Testing/Procedures: Your physician has requested that you have en exercise stress myoview. For further information please visit [email or web address]. Please follow instruction sheet, as given.   Follow-Up: At [facility], you and your health needs are our priority.  As part of our continuing mission to provide you with exceptional heart care, we have created designated Provider Care Teams.  These Care Teams include your primary Cardiologist (physician) and Advanced Practice Providers (APPs -  Physician Assistants and Nurse Practitioners) who all work together to provide you with the care you need, when you need it.  We recommend signing up for the patient portal called "MyChart".  Sign up information is provided on this After Visit Summary.  MyChart is used to connect with patients for Virtual Visits (Telemedicine).  Patients are able to view lab/test results, encounter notes, upcoming appointments, etc.  Non-urgent messages can be sent to your provider as well.   To learn more about what you can do with MyChart, go to [email or web address].    Your next appointment:   6 month(s)  The format for your next appointment:   In Person  Provider:   Eleonore Chiquito, MD

## 2020-10-09 NOTE — Addendum Note (Signed)
Addended by: Hinton Dyer on: 10/09/2020 03:05 PM   Modules accepted: Orders

## 2020-10-10 ENCOUNTER — Telehealth (HOSPITAL_COMMUNITY): Payer: Self-pay | Admitting: *Deleted

## 2020-10-10 NOTE — Telephone Encounter (Signed)
Close encounter 

## 2020-10-14 ENCOUNTER — Telehealth (HOSPITAL_COMMUNITY): Payer: Self-pay | Admitting: *Deleted

## 2020-10-14 NOTE — Telephone Encounter (Signed)
Close encounter 

## 2020-10-15 ENCOUNTER — Other Ambulatory Visit: Payer: Self-pay

## 2020-10-15 ENCOUNTER — Ambulatory Visit (HOSPITAL_COMMUNITY)
Admission: RE | Admit: 2020-10-15 | Discharge: 2020-10-15 | Disposition: A | Discharge status: Home / Self Care | Payer: Medicaid Other | Source: Ambulatory Visit | Attending: Cardiovascular Disease | Admitting: Cardiovascular Disease

## 2020-10-15 DIAGNOSIS — R079 Chest pain, unspecified: Secondary | ICD-10-CM | POA: Diagnosis not present

## 2020-10-15 LAB — MYOCARDIAL PERFUSION IMAGING
LV dias vol: 138 mL (ref 46–106)
LV sys vol: 72 mL
Nuc Stress EF: 48 %
Peak HR: 105 {beats}/min
Rest HR: 71 {beats}/min
Rest Nuclear Isotope Dose: 10.5 mCi
SDS: 0
SRS: 5
SSS: 5
ST Depression (mm): 0 mm
Stress Nuclear Isotope Dose: 31.4 mCi
TID: 1.09

## 2020-10-15 MED ORDER — TECHNETIUM TC 99M TETROFOSMIN IV KIT
10.5000 | PACK | Freq: Once | INTRAVENOUS | Status: AC | PRN
  Administered 2020-10-15: 10.5 via INTRAVENOUS
  Filled 2020-10-15: qty 11

## 2020-10-15 MED ORDER — REGADENOSON 0.4 MG/5ML IV SOLN
0.4000 mg | Freq: Once | INTRAVENOUS | Status: AC
Start: 2020-10-15 — End: 2020-10-15
  Administered 2020-10-15: 0.4 mg via INTRAVENOUS

## 2020-10-15 MED ORDER — TECHNETIUM TC 99M TETROFOSMIN IV KIT
31.4000 | PACK | Freq: Once | INTRAVENOUS | Status: AC | PRN
  Administered 2020-10-15: 31.4 via INTRAVENOUS
  Filled 2020-10-15: qty 32

## 2020-10-31 NOTE — Progress Notes (Signed)
My chart. Her study is normal. She has artifact on her study. -W

## 2020-11-12 ENCOUNTER — Other Ambulatory Visit: Payer: Self-pay | Admitting: Family Medicine

## 2020-11-12 DIAGNOSIS — F419 Anxiety disorder, unspecified: Secondary | ICD-10-CM

## 2020-11-18 ENCOUNTER — Encounter: Payer: Self-pay | Admitting: Gastroenterology

## 2020-11-20 ENCOUNTER — Other Ambulatory Visit: Payer: Self-pay

## 2020-11-20 ENCOUNTER — Ambulatory Visit (INDEPENDENT_AMBULATORY_CARE_PROVIDER_SITE_OTHER): Payer: Medicaid Other | Admitting: Family Medicine

## 2020-11-20 VITALS — BP 183/91 | HR 74 | Ht 67.0 in | Wt 208.0 lb

## 2020-11-20 DIAGNOSIS — I10 Essential (primary) hypertension: Secondary | ICD-10-CM

## 2020-11-20 DIAGNOSIS — Z720 Tobacco use: Secondary | ICD-10-CM | POA: Diagnosis not present

## 2020-11-20 DIAGNOSIS — J441 Chronic obstructive pulmonary disease with (acute) exacerbation: Secondary | ICD-10-CM | POA: Diagnosis present

## 2020-11-20 MED ORDER — BENZONATATE 100 MG PO CAPS: 100.0000 mg | ORAL_CAPSULE | Freq: Two times a day (BID) | ORAL | 0 refills | Status: DC | PRN

## 2020-11-20 MED ORDER — PREDNISONE 20 MG PO TABS: 40.0000 mg | ORAL_TABLET | Freq: Every day | ORAL | 0 refills | Status: AC

## 2020-11-20 NOTE — Progress Notes (Addendum)
    SUBJECTIVE:   CHIEF COMPLAINT / HPI:   Sick Symptoms Cassidy [name] is a 55 y.o. female who presents to the clinic today for concerns of diarrhea, vomiting, tactile fever, chills that started on 10/22. Her concern today is that she has mucous buildup in her throat and has difficulty breathing at night. She is on Bactrim for a urinary tract infection.   She is using her friends nebulizer- states that the gurgling went away with it. She has an albuterol inhaler and uses it on occasion.  The diarrhea, vomiting and fever have since resolved (about 3 days ago). She is doing the [employer] though has a diminished appetite.   She notes that in the past Prednisone has helped her. The cough has caused her to have chest pain, "but that was three days ago". She can taste blood in the sputum. Her sputum is thick. She has clear phlegm.  Has some pain on her right side of her back- describes it as soreness.   Denies chest pain, edema, vision changes.  PERTINENT  PMH / PSH: Hypertension, CAD s/p PCI, COPD, GERD, T2DM, anxiety, fibromyalgia, PTSD  OBJECTIVE:   BP (!) 183/91   Pulse 74   Ht 5\' 7"  (1.702 m)   Wt 208 lb (94.3 kg)   SpO2 100%   BMI 32.58 kg/m    General: NAD, pleasant, able to participate in exam, smells of tobacco HEENT: No pharyngeal erythema, MMM, no cervical LAD Cardiac: RRR, no murmurs. Respiratory: CTAB, normal effort, increased expiratory phase without  wheezes, rales or rhonchi. Intermittent cough  Skin: warm and dry, no rashes noted  ASSESSMENT/PLAN:   1. Obstructive chronic bronchitis with exacerbation [facility]) Assessment: 55 y.o. female with PMHx COPD.  Plan: -We will treat with 5-day course of prednisone 40 mg -Tessalon Perles as needed for cough -Nebulizer machine DME order placed  -Continue controller medications -Continue albuterol as needed for shortness of breath/wheezing -Discussed return precautions -Discussed symptomatic treatment and the lack of need  for antibiotics. -For home use only DME Nebulizer machine  2. Tobacco abuse Encourage smoking cessation. -1-800-QUIT-NOW hotline given to patient  3. Essential hypertension Elevated BP today. Asymptomatic. She did not take her BP medications (losartan and coreg) this morning but reports that she is typically compliant. Discussed importance of BP control.   [name], [place]

## 2020-11-20 NOTE — Patient Instructions (Addendum)
It was wonderful to see you today.  Today we talked about:  -I'll give you a short course of steroids. Take 40 mg of Prednisone daily for 5 days. -You can use Tessalon perles to use afterwards to help with the cough.  -Continue your efforts to stop smoking! You can contact the 1-800-QUIT-NOW hotline for help with this. -If your symptoms don't improve, if you develop chest pain or difficulty breathing, please return.  -Take your blood pressure medications daily! It was high today. If you have symptoms of chest pain, shortness of breath, leg swelling or changes in your vision, please seek medical attention.  Thank you for choosing [facility].   Please call [phone] with any questions about today's appointment.  Please be sure to schedule follow up at the front  desk before you leave today.   [name], DO PGY-2 Family Medicine

## 2020-11-20 NOTE — Assessment & Plan Note (Signed)
Elevated BP today. Asymptomatic. She did not take her BP medications (losartan and coreg) this morning but reports that she is typically compliant. Discussed importance of BP control.

## 2020-11-21 ENCOUNTER — Other Ambulatory Visit: Payer: Self-pay | Admitting: Family Medicine

## 2020-11-21 DIAGNOSIS — Z794 Long term (current) use of insulin: Secondary | ICD-10-CM

## 2020-11-21 DIAGNOSIS — E1165 Type 2 diabetes mellitus with hyperglycemia: Secondary | ICD-10-CM

## 2020-11-26 ENCOUNTER — Other Ambulatory Visit: Payer: Self-pay | Admitting: Family Medicine

## 2020-12-19 ENCOUNTER — Other Ambulatory Visit: Payer: Self-pay | Admitting: Family Medicine

## 2020-12-19 ENCOUNTER — Other Ambulatory Visit: Payer: Self-pay | Admitting: Obstetrics

## 2020-12-19 ENCOUNTER — Ambulatory Visit: Payer: Medicaid Other | Admitting: Gastroenterology

## 2020-12-19 DIAGNOSIS — A6000 Herpesviral infection of urogenital system, unspecified: Secondary | ICD-10-CM

## 2020-12-31 ENCOUNTER — Other Ambulatory Visit: Payer: Self-pay | Admitting: Family Medicine

## 2020-12-31 DIAGNOSIS — F419 Anxiety disorder, unspecified: Secondary | ICD-10-CM

## 2021-01-14 ENCOUNTER — Other Ambulatory Visit: Payer: Self-pay | Admitting: Family Medicine

## 2021-01-14 DIAGNOSIS — Z794 Long term (current) use of insulin: Secondary | ICD-10-CM

## 2021-01-14 DIAGNOSIS — E1165 Type 2 diabetes mellitus with hyperglycemia: Secondary | ICD-10-CM

## 2021-01-22 ENCOUNTER — Ambulatory Visit: Payer: Medicaid Other | Admitting: Gastroenterology

## 2021-01-22 ENCOUNTER — Encounter: Payer: Self-pay | Admitting: Gastroenterology

## 2021-01-22 ENCOUNTER — Telehealth: Payer: Self-pay | Admitting: Gastroenterology

## 2021-01-22 VITALS — BP 120/70 | Ht 67.5 in | Wt 213.0 lb

## 2021-01-22 DIAGNOSIS — Z720 Tobacco use: Secondary | ICD-10-CM | POA: Diagnosis not present

## 2021-01-22 DIAGNOSIS — R194 Change in bowel habit: Secondary | ICD-10-CM | POA: Diagnosis not present

## 2021-01-22 DIAGNOSIS — K5904 Chronic idiopathic constipation: Secondary | ICD-10-CM | POA: Diagnosis not present

## 2021-01-22 DIAGNOSIS — K921 Melena: Secondary | ICD-10-CM | POA: Diagnosis not present

## 2021-01-22 DIAGNOSIS — E669 Obesity, unspecified: Secondary | ICD-10-CM

## 2021-01-22 MED ORDER — PLENVU 140 G PO SOLR: ORAL | Status: DC

## 2021-01-22 MED ORDER — LINACLOTIDE 290 MCG PO CAPS: 290.0000 ug | ORAL_CAPSULE | Freq: Every day | ORAL | 3 refills | Status: DC

## 2021-01-22 NOTE — Progress Notes (Signed)
Cassidy [name]    [id number]    1965/03/07  Primary Care Physician:Espinoza, Dawson Bills, DO  Referring Physician: [name], DO [place],  [place] [address]   Chief complaint:  Alternating constipation and diarrhea, dark stool  HPI: 56 year old African-American female with history of DM, obesity, CAD status post cardiac stents here with complaints of change in bowel habits with alternating constipation and diarrhea.  She is also experiencing dark stool with some strength of dark blood in it over the past few weeks. Is on daily aspirin and uses ibuprofen intermittently.  She is currently not on any other antiplatelet therapy or anticoagulation.  On average she is having bowel movement every 2 to 3 days on MiraLAX 2 capfuls daily Has generalized abdominal bloating but denies any abdominal pain or vomiting.  She has lost some weight on Trulicity, had initially decreased appetite on the medication and is checking with her primary care physician if she can change the dose   Outpatient Encounter Medications as of 01/22/2021  Medication Sig   ACCU-CHEK AVIVA PLUS test strip TEST BLOOD SUGAR THREE TIMES DAILY AS DIRECTED   aspirin EC 81 MG tablet Take 81 mg by mouth daily.   atorvastatin (LIPITOR) 40 MG tablet Take 1 tablet (40 mg total) by mouth daily.   baclofen (LIORESAL) 10 MG tablet Take 1 tablet (10 mg total) by mouth as needed for muscle spasms.   busPIRone (BUSPAR) 15 MG tablet TAKE 1/2 TABLET (7.5 MG TOTAL) BY MOUTH TWICE DAILY   carvedilol (COREG) 6.25 MG tablet Take 1 tablet (6.25 mg total) by mouth 2 (two) times daily with a meal.   conjugated estrogens (PREMARIN) vaginal cream Place 1 Applicatorful vaginally daily.   diclofenac sodium (VOLTAREN) 1 % GEL Apply 1 application topically 3 (three) times daily as needed (pain).   fluticasone (FLONASE) 50 MCG/ACT nasal spray Place 1 spray into both nostrils daily as needed (congestion).    ipratropium-albuterol (DUONEB) 0.5-2.5 (3) MG/3ML SOLN Inhale 1 vial via nebulizer 2 times daily   linaclotide (LINZESS) 290 MCG CAPS capsule Take 1 capsule (290 mcg total) by mouth daily before breakfast.   losartan (COZAAR) 50 MG tablet Take 1 tablet (50 mg total) by mouth daily.   mometasone (ELOCON) 0.1 % cream Apply 1 application topically 2 (two) times daily as needed (wound care (vaginal boils)).   nitroGLYCERIN (NITROSTAT) 0.4 MG SL tablet Place 1 tablet (0.4 mg total) under the tongue every 5 (five) minutes as needed for chest pain.   pantoprazole (PROTONIX) 40 MG tablet TAKE 1 TABLET (40 MG TOTAL) BY MOUTH DAILY.   PEG-KCl-NaCl-NaSulf-Na Asc-C (PLENVU) 140 g SOLR As directed for colon prep   polyethylene glycol powder (GLYCOLAX/MIRALAX) 17 GM/SCOOP powder Take 17 g by mouth daily. Mix with 8-12 oz of water   tiotropium (SPIRIVA HANDIHALER) 18 MCG inhalation capsule Place 1 capsule (18 mcg total) into inhaler and inhale daily.   trimethoprim (TRIMPEX) 100 MG tablet Take 100 mg by mouth daily.   TRULICITY 0.93 AT/5.5DD SOPN INJECT 0.75 MG INTO THE SKIN ONCE A WEEK.   valACYclovir (VALTREX) 1000 MG tablet TAKE 1 TABLET BY MOUTH    vitamin B-12 (CYANOCOBALAMIN) 1000 MCG tablet Take 1,000 mcg by mouth daily.   [DISCONTINUED] benzonatate (TESSALON) 100 MG capsule Take 1 capsule (100 mg total) by mouth 2 (two) times daily as needed for cough.   [DISCONTINUED] solifenacin (VESICARE) 5 MG tablet Take 5 mg by  mouth daily. (Patient not taking: Reported on 11/20/2020)   [DISCONTINUED] terconazole (TERAZOL 7) 0.4 % vaginal cream Place 1 applicator vaginally at bedtime. (Patient not taking: Reported on 11/20/2020)   No facility-administered encounter medications on file as of 01/22/2021.    Allergies as of 01/22/2021 - Review Complete 01/22/2021  Allergen Reaction Noted   Amoxicillin Hives 02/08/2014   Buprenorphine hcl-naloxone hcl  10/07/2020    Past Medical History:  Diagnosis Date   Anemia     Atherosclerotic heart disease of native coronary artery with angina pectoris ([place]) 05/2012, 10/2013   a.  s/p PCTA to dRCA and ostial RPAV-PLA vessel + PDA branch (05/2012)  b. [place] s/p DES to mLAD - Resolute DES 3.0 x 22 (3.61mm -->3.3 mm)   Bipolar depression ([facility] [place])    Breast abscess    a. right side.    COPD (chronic obstructive pulmonary disease) (HCC)    Depression    Diabetic peripheral neuropathy (HCC)    Fibromyalgia    Genital warts    GERD (gastroesophageal reflux disease)    HLD (hyperlipidemia)    Hypertension    Pain in limb    a. LE VENOUS DUPLEX, 02/05/2009 - no evidence of deep vein thrombosis, Baker's cyst   PTSD (post-traumatic stress disorder)    Sickle cell trait ([facility])    Tobacco abuse    Tooth caries    Type II diabetes mellitus ([place])     Past Surgical History:  Procedure Laterality Date   BLADDER SURGERY  1980   "TVT"   BREAST EXCISIONAL BIOPSY     BREAST SURGERY Right    I&D for multiple abscesses   CARDIAC CATHETERIZATION     cutting balloon     EYE SURGERY Left    laser surgery   INCISE AND DRAIN ABCESS  ; 04/26/11; 08/13/11   right breast   INCISION AND DRAINAGE ABSCESS Right 05/09/2012   Procedure: INCISION AND DRAINAGE RIGHT BREAST ABSCESS;  Surgeon: Imogene Burn. Georgette Dover, MD;  Location: [facility];  Service: General;  Laterality: Right;   INCISION AND DRAINAGE ABSCESS Right 02/08/2014   Procedure: INCISION AND DRAINAGE RIGHT BREAST ABSCESS;  Surgeon: Donnie Mesa, MD;  Location: [facility];  Service: General;  Laterality: Right;   INCISION AND DRAINAGE ABSCESS Right 06/14/2014   Procedure: INCISION AND DRAINAGE RIGHT BREAST ABSCESS;  Surgeon: Donnie Mesa, MD;  Location: [place] [facility] [place];  Service: General;  Laterality: Right;   IRRIGATION AND DEBRIDEMENT ABSCESS  12/19/2010   Procedure: IRRIGATION AND DEBRIDEMENT ABSCESS;  Surgeon: Rolm Bookbinder, MD;  Location: [facility];  Service: General;  Laterality: Right;   IRRIGATION AND DEBRIDEMENT ABSCESS  08/13/2011   Procedure:  MINOR INCISION AND DRAINAGE OF ABSCESS;  Surgeon: Gwenyth Ober, MD;  Location: [facility];  Service: General;  Laterality: Right;  Right Breast    IRRIGATION AND DEBRIDEMENT ABSCESS  11/17/2011   Procedure: IRRIGATION AND DEBRIDEMENT ABSCESS;  Surgeon: Imogene Burn. Georgette Dover, MD;  Location: [facility];  Service: General;  Laterality: Right;  irrigation and debridement right recurrent breast abscess   LARYNX SURGERY     LEFT HEART CATHETERIZATION WITH CORONARY ANGIOGRAM N/A 05/29/2012   Procedure: LEFT HEART CATHETERIZATION WITH CORONARY ANGIOGRAM & PTCA;  Surgeon: Troy Sine, MD;  Location: [facility] CATH LAB;  Service: Cardiovascular; Bifurcation dRCA-RPAD/PLA & rPDA  PTCA.   LEFT HEART CATHETERIZATION WITH CORONARY ANGIOGRAM N/A 11/08/2013   Procedure: LEFT HEART CATHETERIZATION WITH CORONARY ANGIOGRAM and Coronary Stent Intervention;  Surgeon: Leonie Man, MD;  Location: [facility] CATH LAB;  Service: Cardiovascular; mLAD 80% (FFR 0.73) - resolute DES 3.0 x 22 mm (postdilated to 3.3 mm->3.5 mm)   NM MYOVIEW LTD  01/26/2009   Normal study, no evidence of ischemia, EF 67%   ployp removed from voice box 03/30/12     REFRACTIVE SURGERY  ~ 2010   right   TONSILLECTOMY  1988    Family History  Problem Relation Age of Onset   COPD Mother    Cancer Mother        lymphoma   Heart disease Mother    Stomach cancer Mother    Hyperlipidemia Father    Heart disease Father    Hypertension Maternal Grandmother    Diabetes Maternal Grandmother    Cancer Maternal Aunt        breast, colon   Crohn's disease Maternal Aunt    Hypertension Brother    Esophageal cancer Neg Hx    Rectal cancer Neg Hx    Liver cancer Neg Hx     Social History   Socioeconomic History   Marital status: Single    Spouse name: Not on file   Number of children: 1   Years of education: Not on file   Highest education level: Not on file  Occupational History   Occupation: homemaker   Occupation: disabled  Tobacco Use   Smoking status:  Every Day    Packs/day: 0.50    Years: 40.00    Pack years: 20.00    Types: Cigarettes   Smokeless tobacco: Never   Tobacco comments:    08/14/11 "quit for 3 wk 05/2011; was smoking 1 ppd since age 89 before I quit; at least I've cut down to 1/4 ppd"  Substance and Sexual Activity   Alcohol use: No    Alcohol/week: 0.0 standard drinks    Comment: recovering addict; 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, marijuana, crack cocaine)   Drug use: Yes    Comment: 08/14/11 'quit everything in 2005"   Sexual activity: Yes    Partners: Male    Birth control/protection: Condom  Other Topics Concern   Not on file  Social History Narrative   Work: disability   Children: son, 48 yrs old   Regular exercise: some/ heart attack in May/ walks 1 mile 3 days a week   Caffeine use: daily, cup of coffee daily   Social Determinants of Health   Financial Resource Strain: Not on file  Food Insecurity: Not on file  Transportation Needs: Not on file  Physical Activity: Not on file  Stress: Not on file  Social Connections: Not on file  Intimate Partner Violence: Not on file      Review of systems: All other review of systems negative except as mentioned in the HPI.   Physical Exam: Vitals:   01/22/21 0913  BP: 120/70   Body mass index is 32.87 kg/m. Gen:      No acute distress HEENT:  sclera anicteric Abd:      soft, non-tender; no palpable masses, no distension Ext:    No edema Neuro: alert and oriented x 3 Psych: normal mood and affect  Data Reviewed:  Reviewed labs, radiology imaging, old records and pertinent past GI work up   Assessment and Plan/Recommendations:  56 year old very pleasant female with history of type 2 diabetes on Trulicity, chronic smoker, COPD, not on home O2, CAD s/p cardiac stent on aspirin here with complaints of change in bowel habits with alternating constipation and diarrhea and intermittent  melena  Check CBC to exclude severe anemia History of intermittent NSAID use,  will need to exclude gastroduodenal ulcer or erosive gastritis Also need to exclude any neoplastic lesion, she is past due for colorectal cancer screening Will plan to proceed with EGD and colonoscopy for further evaluation  Chronic idiopathic constipation: Use Linzess 290 mcg daily, titrate dose based on response Continue with high-fiber diet and increase water intake  Patient smokes on average half pack cigarettes per day, she is allergic to patches and did not tolerate Chantix.  She is trying to cut back.  Discussed smoking cessation   The patient was provided an opportunity to ask questions and all were answered. The patient agreed with the plan and demonstrated an understanding of the instructions.  Damaris Hippo , MD    CC: [name], DO

## 2021-01-22 NOTE — Telephone Encounter (Signed)
Spoke with the patient. She is new to our practice. I gave her the location and hours of our lab. She understands she does not need an appointment and these are non-fasting labs. Thanks me for the call.

## 2021-01-22 NOTE — Patient Instructions (Addendum)
Your provider has requested that you go to the basement level for lab work before leaving today. Press "B" on the elevator. The lab is located at the first door on the left as you exit the elevator.   If you are age 58 or older, your body mass index should be between 23-30. Your Body mass index is 32.87 kg/m. If this is out of the aforementioned range listed, please consider follow up with your Primary Care Provider.  If you are age 56 or younger, your body mass index should be between 19-25. Your Body mass index is 32.87 kg/m. If this is out of the aformentioned range listed, please consider follow up with your Primary Care Provider.   ________________________________________________________  The [facility] providers would like to encourage you to use [facility] to communicate with providers for non-urgent requests or questions.  Due to long hold times on the telephone, sending your provider a message by [facility] may be a faster and more efficient way to get a response.  Please allow 48 business hours for a response.  Please remember that this is for non-urgent requests.  _______________________________________________________   Due to recent changes in healthcare laws, you may see the results of your imaging and laboratory studies on MyChart before your provider has had a chance to review them.  We understand that in some cases there may be results that are confusing or concerning to you. Not all laboratory results come back in the same time frame and the provider may be waiting for multiple results in order to interpret others.  Please give [place] 48 hours in order for your provider to thoroughly review all the results before contacting the office for clarification of your results. .  Limit use of NSAIDS  We have given you samples of Linzess 290 mcg today to try   I appreciate the  opportunity to care for you  Thank You   Harl Bowie , MD

## 2021-01-22 NOTE — Telephone Encounter (Signed)
Patient called and stated that Dr. Silverio Decamp anted her to go to the lab today after her OV. Seeking advice if she can come in tomorrow morning. Please advise.

## 2021-01-26 ENCOUNTER — Other Ambulatory Visit (INDEPENDENT_AMBULATORY_CARE_PROVIDER_SITE_OTHER): Payer: Medicaid Other

## 2021-01-26 DIAGNOSIS — K921 Melena: Secondary | ICD-10-CM

## 2021-01-26 DIAGNOSIS — R194 Change in bowel habit: Secondary | ICD-10-CM

## 2021-01-26 DIAGNOSIS — K5904 Chronic idiopathic constipation: Secondary | ICD-10-CM | POA: Diagnosis not present

## 2021-01-26 LAB — CBC WITH DIFFERENTIAL/PLATELET
Basophils Absolute: 0.1 10*3/uL (ref 0.0–0.1)
Basophils Relative: 0.5 % (ref 0.0–3.0)
Eosinophils Absolute: 0.3 10*3/uL (ref 0.0–0.7)
Eosinophils Relative: 2.6 % (ref 0.0–5.0)
HCT: 40.5 % (ref 36.0–46.0)
Hemoglobin: 13.4 g/dL (ref 12.0–15.0)
Lymphocytes Relative: 29.9 % (ref 12.0–46.0)
Lymphs Abs: 3 10*3/uL (ref 0.7–4.0)
MCHC: 33.2 g/dL (ref 30.0–36.0)
MCV: 91.8 fl (ref 78.0–100.0)
Monocytes Absolute: 0.6 10*3/uL (ref 0.1–1.0)
Monocytes Relative: 6.3 % (ref 3.0–12.0)
Neutro Abs: 6.2 10*3/uL (ref 1.4–7.7)
Neutrophils Relative %: 60.7 % (ref 43.0–77.0)
Platelets: 281 10*3/uL (ref 150.0–400.0)
RBC: 4.41 Mil/uL (ref 3.87–5.11)
RDW: 13.4 % (ref 11.5–15.5)
WBC: 10.2 10*3/uL (ref 4.0–10.5)

## 2021-01-26 LAB — BASIC METABOLIC PANEL
BUN: 11 mg/dL (ref 6–23)
CO2: 29 mEq/L (ref 19–32)
Calcium: 9.2 mg/dL (ref 8.4–10.5)
Chloride: 107 mEq/L (ref 96–112)
Creatinine, Ser: 0.79 mg/dL (ref 0.40–1.20)
GFR: 83.15 mL/min (ref >60.00)
Glucose, Bld: 111 mg/dL — ABNORMAL HIGH (ref 70–99)
Potassium: 3.7 mEq/L (ref 3.5–5.1)
Sodium: 141 mEq/L (ref 135–145)

## 2021-02-18 ENCOUNTER — Ambulatory Visit (AMBULATORY_SURGERY_CENTER): Payer: Medicaid Other | Admitting: Gastroenterology

## 2021-02-18 ENCOUNTER — Encounter: Payer: Self-pay | Admitting: Gastroenterology

## 2021-02-18 VITALS — BP 169/93 | HR 80 | Temp 95.9°F | Resp 13 | Ht 67.0 in | Wt 213.0 lb

## 2021-02-18 DIAGNOSIS — R1013 Epigastric pain: Secondary | ICD-10-CM | POA: Diagnosis not present

## 2021-02-18 DIAGNOSIS — Z1211 Encounter for screening for malignant neoplasm of colon: Secondary | ICD-10-CM | POA: Diagnosis not present

## 2021-02-18 DIAGNOSIS — K2100 Gastro-esophageal reflux disease with esophagitis, without bleeding: Secondary | ICD-10-CM | POA: Diagnosis not present

## 2021-02-18 DIAGNOSIS — K449 Diaphragmatic hernia without obstruction or gangrene: Secondary | ICD-10-CM

## 2021-02-18 DIAGNOSIS — D125 Benign neoplasm of sigmoid colon: Secondary | ICD-10-CM

## 2021-02-18 DIAGNOSIS — K921 Melena: Secondary | ICD-10-CM

## 2021-02-18 DIAGNOSIS — D122 Benign neoplasm of ascending colon: Secondary | ICD-10-CM | POA: Diagnosis not present

## 2021-02-18 MED ORDER — SODIUM CHLORIDE 0.9 % IV SOLN
500.0000 mL | Freq: Once | INTRAVENOUS | Status: DC
Start: 2021-02-18 — End: 2021-02-18

## 2021-02-18 NOTE — Op Note (Signed)
[facility] Patient Name: Cassidy [name] Procedure Date: 02/18/2021 2:04 PM MRN: [id number] Endoscopist: Mauri Pole , MD Age: 56 Referring MD:  Date of Birth: Jun 28, 1965 Gender: Female Account #: [id number] Procedure:                Colonoscopy Indications:              Screening for colorectal malignant neoplasm Medicines:                Monitored Anesthesia Care Procedure:                Pre-Anesthesia Assessment:                           - Prior to the procedure, a History and Physical                            was performed, and patient medications and                            allergies were reviewed. The patient's tolerance of                            previous anesthesia was also reviewed. The risks                            and benefits of the procedure and the sedation                            options and risks were discussed with the patient.                            All questions were answered, and informed consent                            was obtained. Prior Anticoagulants: The patient has                            taken no previous anticoagulant or antiplatelet                            agents except for aspirin. ASA Grade Assessment:                            III - A patient with severe systemic disease. After                            reviewing the risks and benefits, the patient was                            deemed in satisfactory condition to undergo the                            procedure.  After obtaining informed consent, the colonoscope                            was passed under direct vision. Throughout the                            procedure, the patient's blood pressure, pulse, and                            oxygen saturations were monitored continuously. The                            PCF-HQ190L Colonoscope was introduced through the                            anus and advanced to the the cecum,  identified by                            appendiceal orifice and ileocecal valve. The                            colonoscopy was performed without difficulty. The                            patient tolerated the procedure well. The quality                            of the bowel preparation was good. The ileocecal                            valve, appendiceal orifice, and rectum were                            photographed. Scope In: 2:17:57 PM Scope Out: 2:39:45 PM Scope Withdrawal Time: 0 hours 13 minutes 5 seconds  Total Procedure Duration: 0 hours 21 minutes 48 seconds  Findings:                 The perianal and digital rectal examinations were                            normal.                           A 2 mm polyp was found in the sigmoid colon. The                            polyp was sessile. The polyp was removed with a                            cold biopsy forceps. Resection and retrieval were                            complete.  Five sessile polyps were found in the sigmoid colon                            and ascending colon. The polyps were 4 to 6 mm in                            size. These polyps were removed with a cold snare.                            Resection and retrieval were complete.                           A few small-mouthed diverticula were found in the                            sigmoid colon and ascending colon.                            Peri-diverticular erythema was seen.                           Non-bleeding internal hemorrhoids were found during                            retroflexion. The hemorrhoids were small. Complications:            No immediate complications. Estimated Blood Loss:     Estimated blood loss was minimal. Impression:               - One 2 mm polyp in the sigmoid colon, removed with                            a cold biopsy forceps. Resected and retrieved.                           - Five 4 to 6 mm polyps  in the sigmoid colon and in                            the ascending colon, removed with a cold snare.                            Resected and retrieved.                           - Mild diverticulosis in the sigmoid colon and in                            the ascending colon. Peri-diverticular erythema was                            seen.                           - Non-bleeding internal hemorrhoids. Recommendation:           -  Patient has a contact number available for                            emergencies. The signs and symptoms of potential                            delayed complications were discussed with the                            patient. Return to normal activities tomorrow.                            Written discharge instructions were provided to the                            patient.                           - Resume previous diet.                           - Continue present medications.                           - Await pathology results.                           - Repeat colonoscopy in 5-10 years for surveillance                            based on pathology results. Mauri Pole, MD 02/18/2021 2:53:23 PM This report has been signed electronically.

## 2021-02-18 NOTE — Patient Instructions (Signed)
Please read handouts provided. Continue present medications. Use Protonix ( pantoprazole ) 40 mg daily. Follow an antireflux regimen. Await pathology results.   YOU HAD AN ENDOSCOPIC PROCEDURE TODAY AT [facility]:   Refer to the procedure report that was given to you for any specific questions about what was found during the examination.  If the procedure report does not answer your questions, please call your gastroenterologist to clarify.  If you requested that your care partner not be given the details of your procedure findings, then the procedure report has been included in a sealed envelope for you to review at your convenience later.  YOU SHOULD EXPECT: Some feelings of bloating in the abdomen. Passage of more gas than usual.  Walking can help get rid of the air that was put into your GI tract during the procedure and reduce the bloating. If you had a lower endoscopy (such as a colonoscopy or flexible sigmoidoscopy) you may notice spotting of blood in your stool or on the toilet paper. If you underwent a bowel prep for your procedure, you may not have a normal bowel movement for a few days.  Please Note:  You might notice some irritation and congestion in your nose or some drainage.  This is from the oxygen used during your procedure.  There is no need for concern and it should clear up in a day or so.  SYMPTOMS TO REPORT IMMEDIATELY:  Following lower endoscopy (colonoscopy or flexible sigmoidoscopy):  Excessive amounts of blood in the stool  Significant tenderness or worsening of abdominal pains  Swelling of the abdomen that is new, acute  Fever of 100F or higher  Following upper endoscopy (EGD)  Vomiting of blood or coffee ground material  New chest pain or pain under the shoulder blades  Painful or persistently difficult swallowing  New shortness of breath  Fever of 100F or higher  Black, tarry-looking stools  For urgent or emergent issues, a  gastroenterologist can be reached at any hour by calling [phone]. Do not use MyChart messaging for urgent concerns.    DIET:  We do recommend a small meal at first, but then you may proceed to your regular diet.  Drink plenty of fluids but you should avoid alcoholic beverages for 24 hours.  ACTIVITY:  You should plan to take it easy for the rest of today and you should NOT DRIVE or use heavy machinery until tomorrow (because of the sedation medicines used during the test).    FOLLOW UP: Our staff will call the number listed on your records 48-72 hours following your procedure to check on you and address any questions or concerns that you may have regarding the information given to you following your procedure. If we do not reach you, we will leave a message.  We will attempt to reach you two times.  During this call, we will ask if you have developed any symptoms of COVID 19. If you develop any symptoms (ie: fever, flu-like symptoms, shortness of breath, cough etc.) before then, please call [phone].  If you test positive for Covid 19 in the 2 weeks post procedure, please call and report this information to [place].    If any biopsies were taken you will be contacted by phone or by letter within the next 1-3 weeks.  Please call us at [phone] if you have not heard about the biopsies in 3 weeks.    SIGNATURES/CONFIDENTIALITY: You and/or your care partner have signed paperwork  which will be entered into your electronic medical record.  These signatures attest to the fact that that the information above on your After Visit Summary has been reviewed and is understood.  Full responsibility of the confidentiality of this discharge information lies with you and/or your care-partner.

## 2021-02-18 NOTE — Progress Notes (Signed)
Vitals-DT  History reviewed.  Patient explained that she had a blood sugar of 90, and she had shortness of breath.  She used her inhaler.  Patient is sweating profusely. Patient states that she was having a hot flash and anxiety. Denies chest pain.  Dr Silverio Decamp and Gaye Pollack, CRNA.  They stated that they will proceed with the procedure.  Blood sugar is 138.  Heart rate stable as well as BP.  Patient much better after admission.

## 2021-02-18 NOTE — Progress Notes (Signed)
A and O x3. Report to RN. Tolerated MAC anesthesia well. Teeth unchanged after procedure.

## 2021-02-18 NOTE — Op Note (Signed)
[place] Patient Name: Cassidy [name] Procedure Date: 02/18/2021 2:04 PM MRN: [id number] Endoscopist: Mauri Pole , MD Age: 56 Referring MD:  Date of Birth: 05/17/1965 Gender: Female Account #: [id number] Procedure:                Upper GI endoscopy Indications:              Suspected upper gastrointestinal bleeding,                            Epigastric abdominal pain, [place] Medicines:                Monitored Anesthesia Care Procedure:                Pre-Anesthesia Assessment:                           - Prior to the procedure, a History and Physical                            was performed, and patient medications and                            allergies were reviewed. The patient's tolerance of                            previous anesthesia was also reviewed. The risks                            and benefits of the procedure and the sedation                            options and risks were discussed with the patient.                            All questions were answered, and informed consent                            was obtained. Prior Anticoagulants: The patient has                            taken no previous anticoagulant or antiplatelet                            agents except for aspirin. ASA Grade Assessment:                            III - A patient with severe systemic disease. After                            reviewing the risks and benefits, the patient was                            deemed in satisfactory condition to undergo the  procedure.                           After obtaining informed consent, the endoscope was                            passed under direct vision. Throughout the                            procedure, the patient's blood pressure, pulse, and                            oxygen saturations were monitored continuously. The                            Endoscope was introduced through the mouth, and                             advanced to the duodenal bulb. The upper GI                            endoscopy was accomplished without difficulty. The                            patient tolerated the procedure well. Scope In: Scope Out: Findings:                 LA Grade B (one or more mucosal breaks greater than                            5 mm, not extending between the tops of two mucosal                            folds) esophagitis with no bleeding was found 36 to                            38 cm from the incisors.                           A small hiatal hernia was present.                           The stomach was normal.                           The cardia and gastric fundus were normal on                            retroflexion.                           The examined duodenum was normal. Complications:            No immediate complications. Estimated Blood Loss:     Estimated blood loss was minimal. Impression:               -  LA Grade B reflux esophagitis with no bleeding.                           - Small hiatal hernia.                           - Normal stomach.                           - Normal examined duodenum.                           - No specimens collected. Recommendation:           - Patient has a contact number available for                            emergencies. The signs and symptoms of potential                            delayed complications were discussed with the                            patient. Return to normal activities tomorrow.                            Written discharge instructions were provided to the                            patient.                           - Resume previous diet.                           - Continue present medications.                           - Follow an antireflux regimen.                           - Use Protonix (pantoprazole) 40 mg PO daily. Mauri Pole, MD 02/18/2021 2:50:37 PM This report has been signed  electronically.

## 2021-02-18 NOTE — Progress Notes (Signed)
Please refer to office visit note 01/22/21. No additional changes in H&P Patient is appropriate for planned procedure(s) and anesthesia in an ambulatory setting  K. Denzil Magnuson , MD [phone]

## 2021-02-18 NOTE — Progress Notes (Signed)
Called to room to assist during endoscopic procedure.  Patient ID and intended procedure confirmed with present staff. Received instructions for my participation in the procedure from the performing physician.  

## 2021-02-20 ENCOUNTER — Telehealth: Payer: Self-pay

## 2021-02-20 NOTE — Telephone Encounter (Signed)
Left message on follow up call. 

## 2021-02-20 NOTE — Telephone Encounter (Signed)
°  Follow up Call-  Call back number 02/18/2021  Post procedure Call Back phone  # [phone]  Permission to leave phone message Yes  Some recent data might be hidden     Patient questions:  Do you have a fever, pain , or abdominal swelling? No. Pain Score  0 *  Have you tolerated food without any problems? Yes.    Have you been able to return to your normal activities? Yes.    Do you have any questions about your discharge instructions: Diet   No. Medications  No. Follow up visit  No.  Do you have questions or concerns about your Care? No.  Actions: * If pain score is 4 or above: No action needed, pain <4.

## 2021-02-26 ENCOUNTER — Encounter: Payer: Self-pay | Admitting: Gastroenterology

## 2021-03-11 ENCOUNTER — Other Ambulatory Visit: Payer: Self-pay | Admitting: Family Medicine

## 2021-03-11 DIAGNOSIS — Z794 Long term (current) use of insulin: Secondary | ICD-10-CM

## 2021-03-30 ENCOUNTER — Other Ambulatory Visit: Payer: Self-pay | Admitting: Family Medicine

## 2021-03-30 DIAGNOSIS — I10 Essential (primary) hypertension: Secondary | ICD-10-CM

## 2021-04-02 NOTE — H&P (View-Only) (Signed)
?Cardiology Office Note:   ?Date:  04/03/2021  ?NAME:  Cassidy [name]    ?MRN: [id number] ?DOB:  11-18-1965  ? ?PCP:  [name], DO  ?Cardiologist:  None  ?Electrophysiologist:  None  ? ?Referring MD: [name], DO  ? ?Chief Complaint  ?Patient presents with  ? Chest Pain  ? ?History of Present Illness:   ?Cassidy [name] is a 56 y.o. female with a hx of CAD, DM, HTN, HLD who presents for follow-up.  She reports she continued to have episodes of chest pain.  She apparently had 3 episodes at rest.  They occur periodically.  She reports a pressure in her chest.  Last 20 minutes.  Does respond to nitroglycerin.  Symptoms are alarming.  She is very concerned.  She did have a stress test that was normal.  I am a bit concerned given her ongoing tobacco abuse as well as diabetes.  We discussed that her stress test may miss something.  Given her persistent symptoms we discussed left heart catheterization.  She is willing to proceed.  Diabetes seems to be well controlled.  Still smoking 4 to 5 cigarettes/day.  Smoking cessation counseling was provided. ? ?Problem List ?CAD ?-2014: POBA dRCA/PDA/PLV ?-2015: PCI to mid LAD  ?2. HLD ?-T chol 126, HDL 54, LDL 55, TG 90 ?3. DM ?-A1c 6.8 ?4. HTN ?5. Tobacco abuse  ?-40 pack years ? ?Past Medical History: ?Past Medical History:  ?Diagnosis Date  ? Anemia   ? Atherosclerotic heart disease of native coronary artery with angina pectoris ([place]) 05/2012, 10/2013  ? a.  s/p PCTA to dRCA and ostial RPAV-PLA vessel + PDA branch (05/2012)  b. [place] s/p DES to mLAD - Resolute DES 3.0 x 22 (3.73mm -->3.3 mm)  ? Bipolar depression ([place])   ? Breast abscess   ? a. right side.   ? COPD (chronic obstructive pulmonary disease) ([place])   ? Depression   ? Diabetic peripheral neuropathy ([place])   ? Fibromyalgia   ? Genital warts   ? GERD (gastroesophageal reflux disease)   ? HLD (hyperlipidemia)   ? Hypertension   ? Pain in limb   ? a. LE VENOUS DUPLEX, 02/05/2009 - no evidence of deep vein  thrombosis, Baker's cyst  ? PTSD (post-traumatic stress disorder)   ? Sickle cell trait ([place])   ? Tobacco abuse   ? Tooth caries   ? Type II diabetes mellitus ([place])   ? ? ?Past Surgical History: ?Past Surgical History:  ?Procedure Laterality Date  ? BLADDER SURGERY  1980  ? "TVT"  ? BREAST EXCISIONAL BIOPSY    ? BREAST SURGERY Right   ? I&D for multiple abscesses  ? CARDIAC CATHETERIZATION    ? cutting balloon    ? EYE SURGERY Left   ? laser surgery  ? INCISE AND DRAIN ABCESS  ; 04/26/11; 08/13/11  ? right breast  ? INCISION AND DRAINAGE ABSCESS Right 05/09/2012  ? Procedure: INCISION AND DRAINAGE RIGHT BREAST ABSCESS;  Surgeon: Imogene Burn. Georgette Dover, MD;  Location: [facility];  Service: General;  Laterality: Right;  ? INCISION AND DRAINAGE ABSCESS Right 02/08/2014  ? Procedure: INCISION AND DRAINAGE RIGHT BREAST ABSCESS;  Surgeon: Donnie Mesa, MD;  Location: [facility];  Service: General;  Laterality: Right;  ? INCISION AND DRAINAGE ABSCESS Right 06/14/2014  ? Procedure: INCISION AND DRAINAGE RIGHT BREAST ABSCESS;  Surgeon: Donnie Mesa, MD;  Location: [facility];  Service: General;  Laterality: Right;  ? [place]  12/19/2010  ? Procedure: IRRIGATION AND DEBRIDEMENT ABSCESS;  Surgeon: Rolm Bookbinder, MD;  Location: [facility];  Service: General;  Laterality: Right;  ? IRRIGATION AND DEBRIDEMENT ABSCESS  08/13/2011  ? Procedure: MINOR INCISION AND DRAINAGE OF ABSCESS;  Surgeon: Gwenyth Ober, MD;  Location: [facility];  Service: General;  Laterality: Right;  Right Breast ?  ? IRRIGATION AND DEBRIDEMENT ABSCESS  11/17/2011  ? Procedure: IRRIGATION AND DEBRIDEMENT ABSCESS;  Surgeon: Imogene Burn. Georgette Dover, MD;  Location: [facility];  Service: General;  Laterality: Right;  irrigation and debridement right recurrent breast abscess  ? LARYNX SURGERY    ? LEFT HEART CATHETERIZATION WITH CORONARY ANGIOGRAM N/A 05/29/2012  ? Procedure: LEFT HEART CATHETERIZATION WITH CORONARY ANGIOGRAM & PTCA;  Surgeon: Troy Sine, MD;  Location:  [facility] CATH LAB;  Service: Cardiovascular; Bifurcation dRCA-RPAD/PLA & rPDA  PTCA.  ? LEFT HEART CATHETERIZATION WITH CORONARY ANGIOGRAM N/A 11/08/2013  ? Procedure: LEFT HEART CATHETERIZATION WITH CORONARY ANGIOGRAM and Coronary Stent Intervention;  Surgeon: Leonie Man, MD;  Location: [facility] CATH LAB;  Service: Cardiovascular; mLAD 80% (FFR 0.73) - resolute DES 3.0 x 22 mm (postdilated to 3.3 mm->3.5 mm)  ? NM MYOVIEW LTD  01/26/2009  ? Normal study, no evidence of ischemia, EF 67%  ? ployp removed from voice box 03/30/12    ? REFRACTIVE SURGERY  ~ 2010  ? right  ? TONSILLECTOMY  1988  ? ? ?Current Medications: ?  ? ?Allergies:    ?Amoxicillin  ? ?Social History: ?Social History  ? ?Socioeconomic History  ? Marital status: Single  ?  Spouse name: Not on file  ? Number of children: 1  ? Years of education: Not on file  ? Highest education level: Not on file  ?Occupational History  ? Occupation: homemaker  ? Occupation: disabled  ?Tobacco Use  ? Smoking status: Every Day  ?  Packs/day: 0.50  ?  Years: 40.00  ?  Pack years: 20.00  ?  Types: Cigarettes  ? Smokeless tobacco: Never  ? Tobacco comments:  ?  08/14/11 "quit for 3 wk 05/2011; was smoking 1 ppd since age 10 before I quit; at least I've cut down to 1/4 ppd"  ?Vaping Use  ? Vaping Use: Never used  ?Substance and Sexual Activity  ? Alcohol use: No  ?  Alcohol/week: 0.0 standard drinks  ?  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 Drug use: Yes  ?  Comment: 08/14/11 'quit everything in 2005"  ? Sexual activity: Yes  ?  Partners: Male  ?  Birth control/protection: Condom  ?Other Topics Concern  ? Not on file  ?Social History Narrative  ? Work: disability  ? Children: son, 27 yrs old  ? Regular exercise: some/ heart attack in May/ walks 1 mile 3 days a week  ? Caffeine use: daily, cup of coffee daily  ? ?Social Determinants of Health  ? ?Financial Resource Strain: Not on file  ?Food Insecurity: Not on file  ?Transportation Needs: Not  on file  ?Physical Activity: Not on file  ?Stress: Not on file  ?Social Connections: Not on file  ?  ? ?Family History: ?The patient's family history includes COPD in her mother; Cancer in her maternal aunt and mother; Crohn's disease in her maternal aunt; Diabetes in her maternal grandmother; Esophageal cancer in her mother; Heart disease in her father and mother; Hyperlipidemia in her father; Hypertension in her brother and maternal grandmother; Stomach cancer in her mother. There  is no history of Rectal cancer, Liver cancer, or Colon cancer. ? ?ROS:   ?All other ROS reviewed and negative. Pertinent positives noted in the HPI.    ? ?EKGs/Labs/Other Studies Reviewed:   ?The following studies were personally reviewed by me today: ? ?EKG:  EKG is ordered today.  The ekg ordered today demonstrates normal sinus rhythm heart 66, old anteroseptal infarct, and was personally reviewed by me.  ? ?NM Stress 10/15/2020 ?  Findings are consistent with prior myocardial infarction. The study is intermediate risk. ?  No ST deviation was noted. ?  LV perfusion is abnormal. Defect 1: There is a medium defect with mild reduction in uptake present in the mid to basal inferior location(s) that is fixed. There is normal wall motion in the defect area. Consistent with infarction. ?  Left ventricular function is abnormal. Global function is mildly reduced. There were no regional wall motion abnormalities. Nuclear stress EF: 48 %. The left ventricular ejection fraction is mildly decreased (45-54%). End diastolic cavity size is normal. ?  Prior study available for comparison from 05/27/2012. Inferior wall abnormality noted then. ?  Inferior wall perfusion abnormality may be influenced by radiotracer gut uptake ? ?Recent Labs: ?01/26/2021: BUN 11; Creatinine, Ser 0.79; Hemoglobin 13.4; Platelets 281.0; Potassium 3.7; Sodium 141  ? ?Recent Lipid Panel ?   ?Component Value Date/Time  ? CHOL 126 06/11/2020 1009  ? CHOL 132 08/07/2012 0905  ? TRIG  90 06/11/2020 1009  ? TRIG 73 08/07/2012 0905  ? HDL 54 06/11/2020 1009  ? HDL 42 08/07/2012 0905  ? CHOLHDL 2.3 06/11/2020 1009  ? CHOLHDL 4 11/29/2014 1140  ? VLDL 23.8 11/29/2014 1140  ? [place] 55

## 2021-04-02 NOTE — Progress Notes (Signed)
?Cardiology Office Note:   ?Date:  04/03/2021  ?NAME:  Cassidy [name]    ?MRN: [id number] ?DOB:  [birth year]  ? ?PCP:  [name], DO  ?Cardiologist:  None  ?Electrophysiologist:  None  ? ?Referring MD: [name], DO  ? ?Chief Complaint  ?Patient presents with  ? Chest Pain  ? ?History of Present Illness:   ?Cassidy [name] is a 56 y.o. female with a hx of CAD, DM, HTN, HLD who presents for follow-up.  She reports she continued to have episodes of chest pain.  She apparently had 3 episodes at rest.  They occur periodically.  She reports a pressure in her chest.  Last 20 minutes.  Does respond to nitroglycerin.  Symptoms are alarming.  She is very concerned.  She did have a stress test that was normal.  I am a bit concerned given her ongoing tobacco abuse as well as diabetes.  We discussed that her stress test may miss something.  Given her persistent symptoms we discussed left heart catheterization.  She is willing to proceed.  Diabetes seems to be well controlled.  Still smoking 4 to 5 cigarettes/day.  Smoking cessation counseling was provided. ? ?Problem List ?CAD ?-2014: POBA dRCA/PDA/PLV ?-2015: PCI to mid LAD  ?2. HLD ?-T chol 126, HDL 54, LDL 55, TG 90 ?3. DM ?-A1c 6.8 ?4. HTN ?5. Tobacco abuse  ?-40 pack years ? ?Past Medical History: ?Past Medical History:  ?Diagnosis Date  ? Anemia   ? Atherosclerotic heart disease of native coronary artery with angina pectoris ([place]) 05/2012, 10/2013  ? a.  s/p PCTA to dRCA and ostial RPAV-PLA vessel + PDA branch (05/2012)  b. [place] s/p DES to mLAD - Resolute DES 3.0 x 22 (3.1mm -->3.3 mm)  ? Bipolar depression ([place])   ? Breast abscess   ? a. right side.   ? COPD (chronic obstructive pulmonary disease) ([place])   ? Depression   ? Diabetic peripheral neuropathy ([place])   ? Fibromyalgia   ? Genital warts   ? GERD (gastroesophageal reflux disease)   ? HLD (hyperlipidemia)   ? Hypertension   ? Pain in limb   ? a. LE VENOUS DUPLEX, 02/05/2009 - no evidence of deep vein  thrombosis, Baker's cyst  ? PTSD (post-traumatic stress disorder)   ? Sickle cell trait ([place])   ? Tobacco abuse   ? Tooth caries   ? Type II diabetes mellitus ([place])   ? ? ?Past Surgical History: ?Past Surgical History:  ?Procedure Laterality Date  ? BLADDER SURGERY  1980  ? "TVT"  ? BREAST EXCISIONAL BIOPSY    ? BREAST SURGERY Right   ? I&D for multiple abscesses  ? CARDIAC CATHETERIZATION    ? cutting balloon    ? EYE SURGERY Left   ? laser surgery  ? INCISE AND DRAIN ABCESS  ; 04/26/11; 08/13/11  ? right breast  ? INCISION AND DRAINAGE ABSCESS Right 05/09/2012  ? Procedure: INCISION AND DRAINAGE RIGHT BREAST ABSCESS;  Surgeon: Imogene Burn. Georgette Dover, MD;  Location: [facility];  Service: General;  Laterality: Right;  ? INCISION AND DRAINAGE ABSCESS Right 02/08/2014  ? Procedure: INCISION AND DRAINAGE RIGHT BREAST ABSCESS;  Surgeon: Donnie Mesa, MD;  Location: [facility];  Service: General;  Laterality: Right;  ? INCISION AND DRAINAGE ABSCESS Right 06/14/2014  ? Procedure: INCISION AND DRAINAGE RIGHT BREAST ABSCESS;  Surgeon: Donnie Mesa, MD;  Location: [facility];  Service: General;  Laterality: Right;  ? [place]  12/19/2010  ? Procedure: IRRIGATION AND DEBRIDEMENT ABSCESS;  Surgeon: Rolm Bookbinder, MD;  Location: [facility];  Service: General;  Laterality: Right;  ? IRRIGATION AND DEBRIDEMENT ABSCESS  08/13/2011  ? Procedure: MINOR INCISION AND DRAINAGE OF ABSCESS;  Surgeon: Gwenyth Ober, MD;  Location: [facility];  Service: General;  Laterality: Right;  Right Breast ?  ? IRRIGATION AND DEBRIDEMENT ABSCESS  11/17/2011  ? Procedure: IRRIGATION AND DEBRIDEMENT ABSCESS;  Surgeon: Imogene Burn. Georgette Dover, MD;  Location: [facility];  Service: General;  Laterality: Right;  irrigation and debridement right recurrent breast abscess  ? LARYNX SURGERY    ? LEFT HEART CATHETERIZATION WITH CORONARY ANGIOGRAM N/A 05/29/2012  ? Procedure: LEFT HEART CATHETERIZATION WITH CORONARY ANGIOGRAM & PTCA;  Surgeon: Troy Sine, MD;  Location:  [facility] CATH LAB;  Service: Cardiovascular; Bifurcation dRCA-RPAD/PLA & rPDA  PTCA.  ? LEFT HEART CATHETERIZATION WITH CORONARY ANGIOGRAM N/A 11/08/2013  ? Procedure: LEFT HEART CATHETERIZATION WITH CORONARY ANGIOGRAM and Coronary Stent Intervention;  Surgeon: Leonie Man, MD;  Location: [facility] CATH LAB;  Service: Cardiovascular; mLAD 80% (FFR 0.73) - resolute DES 3.0 x 22 mm (postdilated to 3.3 mm->3.5 mm)  ? NM MYOVIEW LTD  01/26/2009  ? Normal study, no evidence of ischemia, EF 67%  ? ployp removed from voice box 03/30/12    ? REFRACTIVE SURGERY  ~ 2010  ? right  ? TONSILLECTOMY  1988  ? ? ?Current Medications: ?  ? ?Allergies:    ?Amoxicillin  ? ?Social History: ?Social History  ? ?Socioeconomic History  ? Marital status: Single  ?  Spouse name: Not on file  ? Number of children: 1  ? Years of education: Not on file  ? Highest education level: Not on file  ?Occupational History  ? Occupation: homemaker  ? Occupation: disabled  ?Tobacco Use  ? Smoking status: Every Day  ?  Packs/day: 0.50  ?  Years: 40.00  ?  Pack years: 20.00  ?  Types: Cigarettes  ? Smokeless tobacco: Never  ? Tobacco comments:  ?  08/14/11 "quit for 3 wk 05/2011; was smoking 1 ppd since age 15 before I quit; at least I've cut down to 1/4 ppd"  ?Vaping Use  ? Vaping Use: Never used  ?Substance and Sexual Activity  ? Alcohol use: No  ?  Alcohol/week: 0.0 standard drinks  ?  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 Drug use: Yes  ?  Comment: 08/14/11 'quit everything in 2005"  ? Sexual activity: Yes  ?  Partners: Male  ?  Birth control/protection: Condom  ?Other Topics Concern  ? Not on file  ?Social History Narrative  ? Work: disability  ? Children: son, 43 yrs old  ? Regular exercise: some/ heart attack in May/ walks 1 mile 3 days a week  ? Caffeine use: daily, cup of coffee daily  ? ?Social Determinants of Health  ? ?Financial Resource Strain: Not on file  ?Food Insecurity: Not on file  ?Transportation Needs: Not  on file  ?Physical Activity: Not on file  ?Stress: Not on file  ?Social Connections: Not on file  ?  ? ?Family History: ?The patient's family history includes COPD in her mother; Cancer in her maternal aunt and mother; Crohn's disease in her maternal aunt; Diabetes in her maternal grandmother; Esophageal cancer in her mother; Heart disease in her father and mother; Hyperlipidemia in her father; Hypertension in her brother and maternal grandmother; Stomach cancer in her mother. There  is no history of Rectal cancer, Liver cancer, or Colon cancer. ? ?ROS:   ?All other ROS reviewed and negative. Pertinent positives noted in the HPI.    ? ?EKGs/Labs/Other Studies Reviewed:   ?The following studies were personally reviewed by me today: ? ?EKG:  EKG is ordered today.  The ekg ordered today demonstrates normal sinus rhythm heart 66, old anteroseptal infarct, and was personally reviewed by me.  ? ?NM Stress 10/15/2020 ?  Findings are consistent with prior myocardial infarction. The study is intermediate risk. ?  No ST deviation was noted. ?  LV perfusion is abnormal. Defect 1: There is a medium defect with mild reduction in uptake present in the mid to basal inferior location(s) that is fixed. There is normal wall motion in the defect area. Consistent with infarction. ?  Left ventricular function is abnormal. Global function is mildly reduced. There were no regional wall motion abnormalities. Nuclear stress EF: 48 %. The left ventricular ejection fraction is mildly decreased (45-54%). End diastolic cavity size is normal. ?  Prior study available for comparison from 05/27/2012. Inferior wall abnormality noted then. ?  Inferior wall perfusion abnormality may be influenced by radiotracer gut uptake ? ?Recent Labs: ?01/26/2021: BUN 11; Creatinine, Ser 0.79; Hemoglobin 13.4; Platelets 281.0; Potassium 3.7; Sodium 141  ? ?Recent Lipid Panel ?   ?Component Value Date/Time  ? CHOL 126 06/11/2020 1009  ? CHOL 132 08/07/2012 0905  ? TRIG  90 06/11/2020 1009  ? TRIG 73 08/07/2012 0905  ? HDL 54 06/11/2020 1009  ? HDL 42 08/07/2012 0905  ? CHOLHDL 2.3 06/11/2020 1009  ? CHOLHDL 4 11/29/2014 1140  ? VLDL 23.8 11/29/2014 1140  ? [place] 55

## 2021-04-03 ENCOUNTER — Ambulatory Visit (INDEPENDENT_AMBULATORY_CARE_PROVIDER_SITE_OTHER): Payer: Medicaid Other | Admitting: Cardiovascular Disease

## 2021-04-03 ENCOUNTER — Other Ambulatory Visit: Payer: Self-pay

## 2021-04-03 ENCOUNTER — Encounter: Payer: Self-pay | Admitting: Cardiovascular Disease

## 2021-04-03 VITALS — BP 140/80 | HR 74 | Ht 67.5 in | Wt 210.4 lb

## 2021-04-03 DIAGNOSIS — E782 Mixed hyperlipidemia: Secondary | ICD-10-CM

## 2021-04-03 DIAGNOSIS — Z720 Tobacco use: Secondary | ICD-10-CM

## 2021-04-03 DIAGNOSIS — I2510 Atherosclerotic heart disease of native coronary artery without angina pectoris: Secondary | ICD-10-CM

## 2021-04-03 DIAGNOSIS — I25110 Atherosclerotic heart disease of native coronary artery with unstable angina pectoris: Secondary | ICD-10-CM | POA: Diagnosis not present

## 2021-04-03 DIAGNOSIS — I10 Essential (primary) hypertension: Secondary | ICD-10-CM | POA: Diagnosis not present

## 2021-04-03 LAB — CBC
Hematocrit: 38.8 % (ref 34.0–46.6)
Hemoglobin: 13.1 g/dL (ref 11.1–15.9)
MCH: 30.5 pg (ref 26.6–33.0)
MCHC: 33.8 g/dL (ref 31.5–35.7)
MCV: 90 fL (ref 79–97)
Platelets: 305 10*3/uL (ref 150–450)
RBC: 4.29 x10E6/uL (ref 3.77–5.28)
RDW: 12.4 % (ref 11.7–15.4)
WBC: 11 10*3/uL — ABNORMAL HIGH (ref 3.4–10.8)

## 2021-04-03 LAB — BASIC METABOLIC PANEL
BUN/Creatinine Ratio: 14 (ref 9–23)
BUN: 13 mg/dL (ref 6–24)
CO2: 25 mmol/L (ref 20–29)
Calcium: 9.5 mg/dL (ref 8.7–10.2)
Chloride: 104 mmol/L (ref 96–106)
Creatinine, Ser: 0.95 mg/dL (ref 0.57–1.00)
Glucose: 129 mg/dL — ABNORMAL HIGH (ref 70–99)
Potassium: 4 mmol/L (ref 3.5–5.2)
Sodium: 140 mmol/L (ref 134–144)
eGFR: 70 mL/min/{1.73_m2} (ref >59)

## 2021-04-03 MED ORDER — ISOSORBIDE MONONITRATE ER 30 MG PO TB24: 30.0000 mg | ORAL_TABLET | Freq: Every day | ORAL | 3 refills | Status: DC

## 2021-04-03 NOTE — Patient Instructions (Signed)
Medication Instructions:  ?START Imdur 30 mg daily  ? ?*If you need a refill on your cardiac medications before your next appointment, please call your pharmacy* ? ? ?Lab Work: ?BMET, CBC today  ? ?If you have labs (blood work) drawn today and your tests are completely normal, you will receive your results only by: ?MyChart Message (if you have MyChart) OR ?A paper copy in the mail ?If you have any lab test that is abnormal or we need to change your treatment, we will call you to review the results. ? ? ?Testing/Procedures: ? ?Your physician has requested that you have a cardiac catheterization. Cardiac catheterization is used to diagnose and/or treat various heart conditions. Doctors may recommend this procedure for a number of different reasons. The most common reason is to evaluate chest pain. Chest pain can be a symptom of coronary artery disease (CAD), and cardiac catheterization can show whether plaque is narrowing or blocking your heart?s arteries. This procedure is also used to evaluate the valves, as well as measure the blood flow and oxygen levels in different parts of your heart. For further information please visit [email or web address]. Please follow instruction sheet, as given. ? ? ? ?Follow-Up: ?At [facility], you and your health needs are our priority.  As part of our continuing mission to provide you with exceptional heart care, we have created designated Provider Care Teams.  These Care Teams include your primary Cardiologist (physician) and Advanced Practice Providers (APPs -  Physician Assistants and Nurse Practitioners) who all work together to provide you with the care you need, when you need it. ? ?We recommend signing up for the patient portal called "MyChart".  Sign up information is provided on this After Visit Summary.  MyChart is used to connect with patients for Virtual Visits (Telemedicine).  Patients are able to view lab/test results, encounter notes, upcoming appointments, etc.   Non-urgent messages can be sent to your provider as well.   ?To learn more about what you can do with MyChart, go to [email or web address].   ? ?Your next appointment:   ?May 01, 2021 at 11:40 PM  ? ?The format for your next appointment:   ?In Person ? ?Provider:   ?Eleonore Chiquito, MD  ? ? ?Other Instructions ? ?[facility] [address] ?[place] [id number] ?Dept: [phone] ?Loc: [phone] ? ?ROSHANDA BALAZS  04/03/2021 ? ?You are scheduled for a Cardiac Catheterization on Thursday, March 30 with Dr. Larae Grooms. ? ?1. Please arrive at the Main Entrance A at [facility]: [place], [place] [address] at 5:30 AM (This time is two hours before your procedure to ensure your preparation). Free valet parking service is available.  ? ?Special note: Every effort is made to have your procedure done on time. Please understand that emergencies sometimes delay scheduled procedures. ? ?2. Diet: Do not eat solid foods after midnight.  You may have clear liquids until 5 AM upon the day of the procedure. ? ?3. Labs: You will need to have blood drawn today- CBC, BMET today. You do not need to be fasting. ? ?4. Medication instructions in preparation for your procedure: ? ? Contrast Allergy: No ? ?On the morning of your procedure, take Aspirin and any morning medicines NOT listed above.  You may use sips of water. ? ?5. Plan to go home the same day, you will only stay overnight if medically necessary. ?6. You MUST have a  responsible adult to drive you home. ?7. An adult MUST be with you the first 24 hours after you arrive home. ?8. Bring a current list of your medications, and the last time and date medication taken. ?9. Bring ID and current insurance cards. ?10.Please wear clothes that are easy to get on and off and wear slip-on shoes. ? ?Thank you for allowing [place] to care for you! ?  -- [facility] ? ? ? ?

## 2021-04-06 ENCOUNTER — Other Ambulatory Visit (INDEPENDENT_AMBULATORY_CARE_PROVIDER_SITE_OTHER): Payer: Medicaid Other

## 2021-04-06 DIAGNOSIS — R079 Chest pain, unspecified: Secondary | ICD-10-CM | POA: Diagnosis not present

## 2021-04-08 ENCOUNTER — Telehealth: Payer: Self-pay | Admitting: *Deleted

## 2021-04-08 NOTE — Telephone Encounter (Addendum)
Cardiac Catheterization scheduled at [facility] for: Thursday April 09, 2021 7:30 AM ?Arrival time and place: [place] Entrance A at: 5:30 AM ? ? ?No solid food after midnight prior to cath, clear liquids until 5 AM day of procedure. ? ?Medication instructions: ?-Usual morning medications can be taken with sips of water including aspirin 81 mg. ? ? ?Confirmed patient has responsible adult to drive home post procedure and be with patient first 24 hours after arriving home. ? ?Patient reports no new symptoms concerning for COVID-19/no exposure to COVID-19 in the past 10 days. ? ? ?Reviewed procedure instructions with patient.  ?

## 2021-04-09 ENCOUNTER — Encounter (HOSPITAL_COMMUNITY)
Admission: RE | Disposition: A | Discharge status: Home / Self Care | Payer: Medicaid Other | Source: Home / Self Care | Attending: Interventional Cardiology

## 2021-04-09 ENCOUNTER — Ambulatory Visit (HOSPITAL_COMMUNITY)
Admission: RE | Admit: 2021-04-09 | Discharge: 2021-04-09 | Disposition: A | Discharge status: Home / Self Care | Payer: Medicaid Other | Attending: Interventional Cardiology | Admitting: Interventional Cardiology

## 2021-04-09 ENCOUNTER — Ambulatory Visit (HOSPITAL_BASED_OUTPATIENT_CLINIC_OR_DEPARTMENT_OTHER): Payer: Medicaid Other

## 2021-04-09 ENCOUNTER — Encounter (HOSPITAL_COMMUNITY): Payer: Self-pay | Admitting: Interventional Cardiology

## 2021-04-09 ENCOUNTER — Other Ambulatory Visit: Payer: Self-pay

## 2021-04-09 DIAGNOSIS — I5030 Unspecified diastolic (congestive) heart failure: Secondary | ICD-10-CM | POA: Diagnosis not present

## 2021-04-09 DIAGNOSIS — I25118 Atherosclerotic heart disease of native coronary artery with other forms of angina pectoris: Secondary | ICD-10-CM | POA: Diagnosis not present

## 2021-04-09 DIAGNOSIS — F319 Bipolar disorder, unspecified: Secondary | ICD-10-CM | POA: Insufficient documentation

## 2021-04-09 DIAGNOSIS — Z01810 Encounter for preprocedural cardiovascular examination: Secondary | ICD-10-CM | POA: Diagnosis not present

## 2021-04-09 DIAGNOSIS — E1142 Type 2 diabetes mellitus with diabetic polyneuropathy: Secondary | ICD-10-CM | POA: Insufficient documentation

## 2021-04-09 DIAGNOSIS — E782 Mixed hyperlipidemia: Secondary | ICD-10-CM | POA: Insufficient documentation

## 2021-04-09 DIAGNOSIS — J449 Chronic obstructive pulmonary disease, unspecified: Secondary | ICD-10-CM | POA: Insufficient documentation

## 2021-04-09 DIAGNOSIS — D573 Sickle-cell trait: Secondary | ICD-10-CM | POA: Diagnosis not present

## 2021-04-09 DIAGNOSIS — Z7982 Long term (current) use of aspirin: Secondary | ICD-10-CM | POA: Diagnosis not present

## 2021-04-09 DIAGNOSIS — Z79899 Other long term (current) drug therapy: Secondary | ICD-10-CM | POA: Diagnosis not present

## 2021-04-09 DIAGNOSIS — I25110 Atherosclerotic heart disease of native coronary artery with unstable angina pectoris: Secondary | ICD-10-CM | POA: Diagnosis not present

## 2021-04-09 DIAGNOSIS — I10 Essential (primary) hypertension: Secondary | ICD-10-CM | POA: Insufficient documentation

## 2021-04-09 DIAGNOSIS — Z955 Presence of coronary angioplasty implant and graft: Secondary | ICD-10-CM | POA: Insufficient documentation

## 2021-04-09 DIAGNOSIS — I517 Cardiomegaly: Secondary | ICD-10-CM | POA: Diagnosis not present

## 2021-04-09 DIAGNOSIS — F17210 Nicotine dependence, cigarettes, uncomplicated: Secondary | ICD-10-CM | POA: Diagnosis not present

## 2021-04-09 HISTORY — PX: LEFT HEART CATH AND CORONARY ANGIOGRAPHY: CATH118249

## 2021-04-09 LAB — ECHOCARDIOGRAM COMPLETE
AR max vel: 2.79 cm2
AV Area VTI: 2.96 cm2
AV Area mean vel: 2.86 cm2
AV Mean grad: 3.5 mmHg
AV Peak grad: 5.3 mmHg
Ao pk vel: 1.15 m/s
Area-P 1/2: 4.17 cm2
Calc EF: 64.8 %
Height: 67.5 in
MV VTI: 3.77 cm2
S' Lateral: 1.4 cm
Single Plane A2C EF: 68.7 %
Single Plane A4C EF: 63.4 %
Weight: 3360 oz

## 2021-04-09 LAB — GLUCOSE, CAPILLARY: Glucose-Capillary: 112 mg/dL — ABNORMAL HIGH (ref 70–99)

## 2021-04-09 SURGERY — LEFT HEART CATH AND CORONARY ANGIOGRAPHY
Anesthesia: LOCAL

## 2021-04-09 MED ORDER — SODIUM CHLORIDE 0.9% FLUSH: 3.0000 mL | INTRAVENOUS | Status: DC | PRN

## 2021-04-09 MED ORDER — MIDAZOLAM HCL 2 MG/2ML IJ SOLN
INTRAMUSCULAR | Status: AC
  Filled 2021-04-09: qty 2

## 2021-04-09 MED ORDER — SODIUM CHLORIDE 0.9 % WEIGHT BASED INFUSION: 1.0000 mL/kg/h | INTRAVENOUS | Status: DC

## 2021-04-09 MED ORDER — IOHEXOL 350 MG/ML SOLN
INTRAVENOUS | Status: DC | PRN
  Administered 2021-04-09: 60 mL

## 2021-04-09 MED ORDER — HEPARIN (PORCINE) IN NACL 1000-0.9 UT/500ML-% IV SOLN
INTRAVENOUS | Status: DC | PRN
  Administered 2021-04-09 (×2): 500 mL

## 2021-04-09 MED ORDER — MIDAZOLAM HCL 2 MG/2ML IJ SOLN
INTRAMUSCULAR | Status: DC | PRN
  Administered 2021-04-09: 1 mg via INTRAVENOUS
  Administered 2021-04-09: 2 mg via INTRAVENOUS

## 2021-04-09 MED ORDER — LABETALOL HCL 5 MG/ML IV SOLN: 10.0000 mg | INTRAVENOUS | Status: DC | PRN

## 2021-04-09 MED ORDER — VERAPAMIL HCL 2.5 MG/ML IV SOLN
INTRAVENOUS | Status: AC
  Filled 2021-04-09: qty 2

## 2021-04-09 MED ORDER — VERAPAMIL HCL 2.5 MG/ML IV SOLN
INTRAVENOUS | Status: DC | PRN
  Administered 2021-04-09: 10 mL via INTRA_ARTERIAL

## 2021-04-09 MED ORDER — ASPIRIN 81 MG PO CHEW: 81.0000 mg | CHEWABLE_TABLET | ORAL | Status: DC

## 2021-04-09 MED ORDER — SODIUM CHLORIDE 0.9 % IV SOLN: 250.0000 mL | INTRAVENOUS | Status: DC | PRN

## 2021-04-09 MED ORDER — LIDOCAINE HCL (PF) 1 % IJ SOLN
INTRAMUSCULAR | Status: AC
  Filled 2021-04-09: qty 30

## 2021-04-09 MED ORDER — SODIUM CHLORIDE 0.9 % WEIGHT BASED INFUSION
3.0000 mL/kg/h | INTRAVENOUS | Status: AC
  Administered 2021-04-09: 3 mL/kg/h via INTRAVENOUS

## 2021-04-09 MED ORDER — SODIUM CHLORIDE 0.9% FLUSH: 3.0000 mL | Freq: Two times a day (BID) | INTRAVENOUS | Status: DC

## 2021-04-09 MED ORDER — HEPARIN (PORCINE) IN NACL 1000-0.9 UT/500ML-% IV SOLN
INTRAVENOUS | Status: AC
  Filled 2021-04-09: qty 1000

## 2021-04-09 MED ORDER — LIDOCAINE HCL (PF) 1 % IJ SOLN
INTRAMUSCULAR | Status: DC | PRN
  Administered 2021-04-09: 2 mL

## 2021-04-09 MED ORDER — HEPARIN SODIUM (PORCINE) 1000 UNIT/ML IJ SOLN
INTRAMUSCULAR | Status: DC | PRN
  Administered 2021-04-09: 5000 [IU] via INTRAVENOUS

## 2021-04-09 MED ORDER — SODIUM CHLORIDE 0.9 % IV SOLN: INTRAVENOUS | Status: AC

## 2021-04-09 MED ORDER — FENTANYL CITRATE (PF) 100 MCG/2ML IJ SOLN
INTRAMUSCULAR | Status: DC | PRN
  Administered 2021-04-09 (×2): 25 ug via INTRAVENOUS

## 2021-04-09 MED ORDER — ONDANSETRON HCL 4 MG/2ML IJ SOLN: 4.0000 mg | Freq: Four times a day (QID) | INTRAMUSCULAR | Status: DC | PRN

## 2021-04-09 MED ORDER — HYDRALAZINE HCL 20 MG/ML IJ SOLN: 10.0000 mg | INTRAMUSCULAR | Status: DC | PRN

## 2021-04-09 MED ORDER — HEPARIN SODIUM (PORCINE) 1000 UNIT/ML IJ SOLN
INTRAMUSCULAR | Status: AC
  Filled 2021-04-09: qty 10

## 2021-04-09 MED ORDER — FENTANYL CITRATE (PF) 100 MCG/2ML IJ SOLN
INTRAMUSCULAR | Status: AC
  Filled 2021-04-09: qty 2

## 2021-04-09 MED ORDER — ACETAMINOPHEN 325 MG PO TABS: 650.0000 mg | ORAL_TABLET | ORAL | Status: DC | PRN

## 2021-04-09 MED ORDER — VERAPAMIL HCL 2.5 MG/ML IV SOLN
INTRAVENOUS | Status: DC | PRN
  Administered 2021-04-09: 2 mg via INTRAVENOUS

## 2021-04-09 SURGICAL SUPPLY — 12 items
BAND CMPR LRG ZPHR (HEMOSTASIS) ×1
BAND ZEPHYR COMPRESS 30 LONG (HEMOSTASIS) ×1 IMPLANT
CATH 5FR JL3.5 JR4 ANG PIG MP (CATHETERS) ×1 IMPLANT
GLIDESHEATH SLEND SS 6F .021 (SHEATH) ×1 IMPLANT
GUIDEWIRE INQWIRE 1.5J.035X260 (WIRE) IMPLANT
INQWIRE 1.5J .035X260CM (WIRE) ×2
KIT HEART LEFT (KITS) ×3 IMPLANT
NDL PERC 21GX4CM (NEEDLE) IMPLANT
NEEDLE PERC 21GX4CM (NEEDLE) ×2 IMPLANT
PACK CARDIAC CATHETERIZATION (CUSTOM PROCEDURE TRAY) ×3 IMPLANT
TRANSDUCER W/STOPCOCK (MISCELLANEOUS) ×3 IMPLANT
TUBING CIL FLEX 10 FLL-RA (TUBING) ×3 IMPLANT

## 2021-04-09 NOTE — Interval H&P Note (Signed)
Cath Lab Visit (complete for each Cath Lab visit) ? ?Clinical Evaluation Leading to the Procedure:  ? ?ACS: No. ? ?Non-ACS:   ? ?Anginal Classification: CCS III ? ?Anti-ischemic medical therapy: Minimal Therapy (1 class of medications) ? ?Non-Invasive Test Results: No non-invasive testing performed ? ?Prior CABG: No previous CABG ? ? ? ? ? ?History and Physical Interval Note: ? ?04/09/2021 ?7:52 AM ? ?Cassidy [name]  has presented today for surgery, with the diagnosis of unstable angina.  The various methods of treatment have been discussed with the patient and family. After consideration of risks, benefits and other options for treatment, the patient has consented to  Procedure(s): ?LEFT HEART CATH AND CORONARY ANGIOGRAPHY (N/A) as a surgical intervention.  The patient's history has been reviewed, patient examined, no change in status, stable for surgery.  I have reviewed the patient's chart and labs.  Questions were answered to the patient's satisfaction.   ? ? ?Larae Grooms ? ? ?

## 2021-04-14 ENCOUNTER — Telehealth: Payer: Self-pay | Admitting: Interventional Cardiology

## 2021-04-14 ENCOUNTER — Encounter (HOSPITAL_COMMUNITY): Payer: Self-pay | Admitting: Emergency Medicine

## 2021-04-14 ENCOUNTER — Other Ambulatory Visit: Payer: Self-pay

## 2021-04-14 ENCOUNTER — Other Ambulatory Visit: Payer: Self-pay | Admitting: Family Medicine

## 2021-04-14 ENCOUNTER — Ambulatory Visit (HOSPITAL_COMMUNITY)
Admission: EM | Admit: 2021-04-14 | Discharge: 2021-04-14 | Disposition: A | Discharge status: Home / Self Care | Payer: Medicaid Other | Attending: Family Medicine | Admitting: Family Medicine

## 2021-04-14 ENCOUNTER — Telehealth: Payer: Self-pay

## 2021-04-14 DIAGNOSIS — A6000 Herpesviral infection of urogenital system, unspecified: Secondary | ICD-10-CM

## 2021-04-14 DIAGNOSIS — I10 Essential (primary) hypertension: Secondary | ICD-10-CM | POA: Diagnosis not present

## 2021-04-14 DIAGNOSIS — S29019A Strain of muscle and tendon of unspecified wall of thorax, initial encounter: Secondary | ICD-10-CM

## 2021-04-14 DIAGNOSIS — Z9889 Other specified postprocedural states: Secondary | ICD-10-CM

## 2021-04-14 DIAGNOSIS — F419 Anxiety disorder, unspecified: Secondary | ICD-10-CM

## 2021-04-14 MED ORDER — VALACYCLOVIR HCL 1 G PO TABS: ORAL_TABLET | ORAL | 11 refills | Status: DC

## 2021-04-14 MED ORDER — BUSPIRONE HCL 15 MG PO TABS: ORAL_TABLET | ORAL | 11 refills | Status: DC

## 2021-04-14 MED ORDER — LIDOCAINE 4 % EX PTCH
1.0000 | MEDICATED_PATCH | Freq: Every day | CUTANEOUS | 0 refills | Status: DC | PRN
Start: 2021-04-14 — End: 2021-07-02

## 2021-04-14 MED ORDER — TIZANIDINE HCL 2 MG PO TABS
2.0000 mg | ORAL_TABLET | Freq: Three times a day (TID) | ORAL | 0 refills | Status: DC | PRN
Start: 2021-04-14 — End: 2021-08-18

## 2021-04-14 NOTE — Telephone Encounter (Signed)
Patient calls nurse line reporting elevated blood pressure with present symptoms.  ? ?Patient reports blood pressure this morning 177/107. Patient reports blurred vision, she's lethargic and her hands have doubled in size from swelling. Patient took Carvedilol and Losartan today as always. ? ?Patient denies any headaches, SOB or chest pains.  ? ?Patient has reached our to her cardiologist, however has not heard back.  ? ?Patient advised to go to UC with elevated blood pressure and associated symptoms.  ?

## 2021-04-14 NOTE — ED Triage Notes (Signed)
Patient had a cardiac cath on  3/30.  Patient is going for open heart surgery TBA.  Patient has an appt with cardiologist tomorrow to discuss scheduling of surgery.   ? ?Left side pain started yesterday evening.  Patient thought maybe gas.  Patient did not sleep well last night .  Denies chest pain.  Deep breathing makes left torso pain worse, like being punched there.  Hurts with inspiration, but not expiration.   ?

## 2021-04-14 NOTE — Telephone Encounter (Signed)
Pt c/o BP issue: STAT if pt c/o blurred vision, one-sided weakness or slurred speech ? ?1. What are your last 5 BP readings? 177/107, 160/145 148/88 ? ?2. Are you having any other symptoms (ex. Dizziness, headache, blurred vision, passed out)?  Hands are heated and swollen, really bad pain her  back and left side  Cath on 3-30-23s ? ?3. What is your BP issue? High blood pressure ? ?

## 2021-04-14 NOTE — Discharge Instructions (Addendum)
-  Start the muscle relaxer-Zanaflex (tizanidine), up to 3 times daily for muscle spasms and pain.  This can make you drowsy, so take at bedtime or when you do not need to drive or operate machinery. ?-Lidocaine patch as needed for pain - do not use at the same time as heating pad.  ?-Follow-up with cardiologist as scheduled 04/15/21.  ?-Head to the ED if symptoms worsen, including new chest pain, shortness of breath, dizziness, weakness, etc.  ?

## 2021-04-14 NOTE — Telephone Encounter (Signed)
Pt states she started hurting yesterday, left side back/rib cage area. "When it's touched it makes me loose my breath. I just want to make sure I'm not having any complications from that procedure."  Pt took OTC pain meds, and slept on a heating pad "it helped some but not much". Pt reports hands and feet itching and burning. Pt states "I'm about to get up out of here, it's affecting my breathing." Pt is waiting for her brother to arrive to take her to the emergency department. Will get message over to Dr. Audie Box so he is aware pt is going to the Emergency Department.  ?

## 2021-04-14 NOTE — ED Provider Notes (Signed)
?[place] ? ? ? ?CSN: [id number] ?Arrival date & time: 04/14/21  1428 ? ? ?  ? ?History   ?Chief Complaint ?Chief Complaint  ?Patient presents with  ? Spasms  ? ? ?HPI ?Cassidy [name] is a 55 y.o. female presenting with left side pain for about 1 day.  History of atherosclerotic heart disease, type 2 diabetes, CABG, hypertension.  She had a cardiac cath on 04/09/2021, this showed atherosclerosis of multiple coronary arteries and will be scheduled for CABG soon.  States that for the last day, she has had left side and back pain, worse with movement and deep inspiration.  She denies shortness of breath, but states with deep inspiration she does feel pain.  Denies any left-sided chest pain, epigastric pain, radiation of pain down the left arm or to the jaw, dizziness.  Has attempted a topical pain relief spray with some relief.  States she contacted her cardiologist who told her to go to the emergency department, so she presented to this urgent care.  She is also concerned about some dry itchy skin on her hands, has not tried interventions yet.  States her blood pressure was about 180/110 at home earlier today, she had not yet taken her losartan 50 mg.  She has since taken the medication. ? ?HPI ? ?Past Medical History:  ?Diagnosis Date  ? Anemia   ? Atherosclerotic heart disease of native coronary artery with angina pectoris ([place]) 05/2012, 10/2013  ? a.  s/p PCTA to dRCA and ostial RPAV-PLA vessel + PDA branch (05/2012)  b. [place] s/p DES to mLAD - Resolute DES 3.0 x 22 (3.48mm -->3.3 mm)  ? Bipolar depression ([place])   ? Breast abscess   ? a. right side.   ? COPD (chronic obstructive pulmonary disease) ([place])   ? Depression   ? Diabetic peripheral neuropathy ([place])   ? Fibromyalgia   ? Genital warts   ? GERD (gastroesophageal reflux disease)   ? HLD (hyperlipidemia)   ? Hypertension   ? Pain in limb   ? a. LE VENOUS DUPLEX, 02/05/2009 - no evidence of deep vein thrombosis, Baker's cyst  ? PTSD (post-traumatic stress  disorder)   ? Sickle cell trait ([place])   ? Tobacco abuse   ? Tooth caries   ? Type II diabetes mellitus ([place])   ? ? ?Patient Active Problem List  ? Diagnosis Date Noted  ? Abdominal pain 07/21/2020  ? Type 2 diabetes mellitus without complications ([place]) 25/85/2778  ? Back pain 06/11/2020  ? Anxiety 05/22/2020  ? Angina pectoris ([place]) 05/22/2020  ? Blood clotting disorder ([place]) 06/21/2019  ? Atherosclerotic heart disease of native coronary artery with other forms of angina pectoris ([place]) 06/21/2019  ? Obstructive chronic bronchitis with exacerbation ([place])   ? Hyperlipidemia with target LDL less than 70   ? Fibromyalgia   ? PTSD (post-traumatic stress disorder)   ? CAD S/P percutaneous coronary angioplasty 11/02/2013  ? Leiomyoma of uterus, unspecified 07/20/2012  ? Essential hypertension 05/26/2012  ? Tobacco abuse 05/26/2012  ? Unstable angina pectoris ([place]) 05/26/2012  ? CARPAL TUNNEL SYNDROME, LEFT 12/18/2009  ? TINEA VERSICOLOR 04/06/2007  ? BIPOLAR I D/O MOST RECENT EPIS DEPRESSED MOD 11/24/2006  ? HERPES, GENITAL NEC 09/30/2006  ? SUBSTANCE ABUSE 09/30/2006  ? GERD 09/30/2006  ? PLANTAR FASCIITIS 09/30/2006  ? ? ?Past Surgical History:  ?Procedure Laterality Date  ? BLADDER SURGERY  1980  ? "TVT"  ? BREAST EXCISIONAL BIOPSY    ? BREAST  SURGERY Right   ? I&D for multiple abscesses  ? CARDIAC CATHETERIZATION    ? cutting balloon    ? EYE SURGERY Left   ? laser surgery  ? INCISE AND DRAIN ABCESS  ; 04/26/11; 08/13/11  ? right breast  ? INCISION AND DRAINAGE ABSCESS Right 05/09/2012  ? Procedure: INCISION AND DRAINAGE RIGHT BREAST ABSCESS;  Surgeon: Imogene Burn. Georgette Dover, MD;  Location: [facility];  Service: General;  Laterality: Right;  ? INCISION AND DRAINAGE ABSCESS Right 02/08/2014  ? Procedure: INCISION AND DRAINAGE RIGHT BREAST ABSCESS;  Surgeon: Donnie Mesa, MD;  Location: [facility] [place] [facility];  Service: General;  Laterality: Right;  ? INCISION AND DRAINAGE ABSCESS Right 06/14/2014  ? Procedure: INCISION AND DRAINAGE RIGHT BREAST  ABSCESS;  Surgeon: Donnie Mesa, MD;  Location: [facility];  Service: General;  Laterality: Right;  ? IRRIGATION AND DEBRIDEMENT ABSCESS  12/19/2010  ? Procedure: IRRIGATION AND DEBRIDEMENT ABSCESS;  Surgeon: Rolm Bookbinder, MD;  Location: [facility];  Service: General;  Laterality: Right;  ? IRRIGATION AND DEBRIDEMENT ABSCESS  08/13/2011  ? Procedure: MINOR INCISION AND DRAINAGE OF ABSCESS;  Surgeon: Gwenyth Ober, MD;  Location: [facility];  Service: General;  Laterality: Right;  Right Breast ?  ? IRRIGATION AND DEBRIDEMENT ABSCESS  11/17/2011  ? Procedure: IRRIGATION AND DEBRIDEMENT ABSCESS;  Surgeon: Imogene Burn. Georgette Dover, MD;  Location: [facility];  Service: General;  Laterality: Right;  irrigation and debridement right recurrent breast abscess  ? LARYNX SURGERY    ? LEFT HEART CATH AND CORONARY ANGIOGRAPHY N/A 04/09/2021  ? Procedure: LEFT HEART CATH AND CORONARY ANGIOGRAPHY;  Surgeon: Jettie Booze, MD;  Location: [facility];  Service: Cardiovascular;  Laterality: N/A;  ? LEFT HEART CATHETERIZATION WITH CORONARY ANGIOGRAM N/A 05/29/2012  ? Procedure: LEFT HEART CATHETERIZATION WITH CORONARY ANGIOGRAM & PTCA;  Surgeon: Troy Sine, MD;  Location: [facility] CATH LAB;  Service: Cardiovascular; Bifurcation dRCA-RPAD/PLA & rPDA  PTCA.  ? LEFT HEART CATHETERIZATION WITH CORONARY ANGIOGRAM N/A 11/08/2013  ? Procedure: LEFT HEART CATHETERIZATION WITH CORONARY ANGIOGRAM and Coronary Stent Intervention;  Surgeon: Leonie Man, MD;  Location: [facility] CATH LAB;  Service: Cardiovascular; mLAD 80% (FFR 0.73) - resolute DES 3.0 x 22 mm (postdilated to 3.3 mm->3.5 mm)  ? NM MYOVIEW LTD  01/26/2009  ? Normal study, no evidence of ischemia, EF 67%  ? ployp removed from voice box 03/30/12    ? REFRACTIVE SURGERY  ~ 2010  ? right  ? TONSILLECTOMY  1988  ? ? ?OB History   ? ? Gravida  ?1  ? Para  ?1  ? Term  ?1  ? Preterm  ?   ? AB  ?   ? Living  ?1  ?  ? ? SAB  ?   ? IAB  ?   ? Ectopic  ?   ? Multiple  ?   ? Live Births  ?1  ?   ?  ?   ? ? ? ?Home Medications   ? ? ? ?Family History ?Family History  ?Problem Relation Age of Onset  ? Esophageal cancer Mother   ? COPD Mother   ? Cancer Mother   ?     lymphoma  ? Heart disease Mother   ? Stomach cancer Mother   ? Hyperlipidemia Father   ? Heart disease Father   ? Hypertension Brother   ? Cancer Maternal Aunt   ?     breast, colon  ? Crohn's disease Maternal Aunt   ?  Hypertension Maternal Grandmother   ? Diabetes Maternal Grandmother   ? Rectal cancer Neg Hx   ? Liver cancer Neg Hx   ? Colon cancer Neg Hx   ? ? ?Social History ?Social History  ? ?Tobacco Use  ? Smoking status: Every Day  ?  Packs/day: 0.50  ?  Years: 40.00  ?  Pack years: 20.00  ?  Types: Cigarettes  ? Smokeless tobacco: Never  ? Tobacco comments:  ?  08/14/11 "quit for 3 wk 05/2011; was smoking 1 ppd since age 1 before I quit; at least I've cut down to 1/4 ppd"  ?Vaping Use  ? Vaping Use: Never used  ?Substance Use Topics  ? Alcohol use: No  ?  Alcohol/week: 0.0 standard drinks  ?  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 Drug use: Yes  ?  Comment: 08/14/11 'quit everything in 2005"  ? ? ? ?Allergies   ?Amoxicillin ? ? ?Review of Systems ?Review of Systems  ?Musculoskeletal:  Positive for back pain.  ?All other systems reviewed and are negative. ? ? ?Physical Exam ?Triage Vital Signs ?ED Triage Vitals  ?Enc Vitals Group  ?   BP 04/14/21 1448 123/76  ?   Pulse Rate 04/14/21 1448 77  ?   Resp 04/14/21 1448 20  ?   Temp 04/14/21 1448 98 ?F (36.7 ?C)  ?   Temp Source 04/14/21 1448 Oral  ?   SpO2 04/14/21 1448 100 %  ?   Weight --   ?   Height --   ?   Head Circumference --   ?   Peak Flow --   ?   Pain Score 04/14/21 1445 8  ?   Pain Loc --   ?   Pain Edu? --   ?   Excl. in [place]? --   ? ?No data found. ? ?Updated Vital Signs ?BP 123/76 (BP Location: Right Arm) Comment (BP Location): large cuff  Pulse 77   Temp 98 ?F (36.7 ?C) (Oral)   Resp 20 Comment: tearful  LMP 06/12/2016 (Approximate)   SpO2  100%  ? ?Visual Acuity ?Right Eye Distance:   ?Left Eye Distance:   ?Bilateral Distance:   ? ?Right Eye Near:   ?Left Eye Near:    ?Bilateral Near:    ? ?Physical Exam ?Vitals reviewed.  ?Constitutional:   ?   [place]

## 2021-04-15 ENCOUNTER — Institutional Professional Consult (permissible substitution) (INDEPENDENT_AMBULATORY_CARE_PROVIDER_SITE_OTHER): Payer: Medicaid Other | Admitting: Thoracic Surgery (Cardiothoracic Vascular Surgery)

## 2021-04-15 VITALS — BP 155/83 | HR 97 | Resp 20 | Ht 67.0 in | Wt 210.0 lb

## 2021-04-15 DIAGNOSIS — I2510 Atherosclerotic heart disease of native coronary artery without angina pectoris: Secondary | ICD-10-CM

## 2021-04-15 NOTE — Progress Notes (Signed)
? ?   ?[address] ?      [place] [address] ?            [phone]       ? ?Cassidy [name] ?[facility] Record #[id number] ?Date of Birth: 05-08-1965 ? ?Referring: Jettie Booze, MD ?Primary Care: [name], DO ?Primary Cardiologist:None ? ?Chief Complaint:    ?Chief Complaint  ?Patient presents with  ? Coronary Artery Disease  ?  Initial surgical consult, Cardiac cath and ECHO 3/30  ? ? ?History of Present Illness:     ?56 year old female with history of coronary artery disease and diabetes melitis presents for surgical evaluation of three-vessel coronary disease that was found on an elective left heart cath.  She describes 3 episodes of anginal symptoms similar to her heart attack in 2015 where she underwent PCI to the LAD.  All of these have occurred with exertion, but she also admits to having significant anxiety about this. ? ? ?Past Medical and Surgical History: ?Previous Chest Surgery: Multiple breast procedures for infections. ?Previous Chest Radiation: None ?Diabetes Mellitus: Yes.  HbA1C pending ?Creatinine: 0.95 ? ?Past Medical History:  ?Diagnosis Date  ? Anemia   ? Atherosclerotic heart disease of native coronary artery with angina pectoris ([place]) 05/2012, 10/2013  ? a.  s/p PCTA to dRCA and ostial RPAV-PLA vessel + PDA branch (05/2012)  b. [place] s/p DES to mLAD - Resolute DES 3.0 x 22 (3.22mm -->3.3 mm)  ? Bipolar depression ([place])   ? Breast abscess   ? a. right side.   ? COPD (chronic obstructive pulmonary disease) ([place])   ? Depression   ? Diabetic peripheral neuropathy ([place])   ? Fibromyalgia   ? Genital warts   ? GERD (gastroesophageal reflux disease)   ? HLD (hyperlipidemia)   ? Hypertension   ? Pain in limb   ? a. LE VENOUS DUPLEX, 02/05/2009 - no evidence of deep vein thrombosis, Baker's cyst  ? PTSD (post-traumatic stress disorder)   ? Sickle cell trait ([place])   ? Tobacco abuse   ? Tooth caries   ? Type II diabetes mellitus ([place])   ? ? ?Past Surgical  History:  ?Procedure Laterality Date  ? BLADDER SURGERY  1980  ? "TVT"  ? BREAST EXCISIONAL BIOPSY    ? BREAST SURGERY Right   ? I&D for multiple abscesses  ? CARDIAC CATHETERIZATION    ? cutting balloon    ? EYE SURGERY Left   ? laser surgery  ? INCISE AND DRAIN ABCESS  ; 04/26/11; 08/13/11  ? right breast  ? INCISION AND DRAINAGE ABSCESS Right 05/09/2012  ? Procedure: INCISION AND DRAINAGE RIGHT BREAST ABSCESS;  Surgeon: Imogene Burn. Georgette Dover, MD;  Location: [facility];  Service: General;  Laterality: Right;  ? INCISION AND DRAINAGE ABSCESS Right 02/08/2014  ? Procedure: INCISION AND DRAINAGE RIGHT BREAST ABSCESS;  Surgeon: Donnie Mesa, MD;  Location: [facility];  Service: General;  Laterality: Right;  ? INCISION AND DRAINAGE ABSCESS Right 06/14/2014  ? Procedure: INCISION AND DRAINAGE RIGHT BREAST ABSCESS;  Surgeon: Donnie Mesa, MD;  Location: [facility];  Service: General;  Laterality: Right;  ? IRRIGATION AND DEBRIDEMENT ABSCESS  12/19/2010  ? Procedure: IRRIGATION AND DEBRIDEMENT ABSCESS;  Surgeon: Rolm Bookbinder, MD;  Location: [facility];  Service: General;  Laterality: Right;  ? IRRIGATION AND DEBRIDEMENT ABSCESS  08/13/2011  ? Procedure: MINOR INCISION AND DRAINAGE OF ABSCESS;  Surgeon: Gwenyth Ober, MD;  Location: [facility];  Service: General;  Laterality: Right;  Right Breast ?  ? IRRIGATION AND DEBRIDEMENT ABSCESS  11/17/2011  ? Procedure: IRRIGATION AND DEBRIDEMENT ABSCESS;  Surgeon: Imogene Burn. Georgette Dover, MD;  Location: [facility];  Service: General;  Laterality: Right;  irrigation and debridement right recurrent breast abscess  ? LARYNX SURGERY    ? LEFT HEART CATH AND CORONARY ANGIOGRAPHY N/A 04/09/2021  ? Procedure: LEFT HEART CATH AND CORONARY ANGIOGRAPHY;  Surgeon: Jettie Booze, MD;  Location: [facility];  Service: Cardiovascular;  Laterality: N/A;  ? LEFT HEART CATHETERIZATION WITH CORONARY ANGIOGRAM N/A 05/29/2012  ? Procedure: LEFT HEART CATHETERIZATION WITH CORONARY ANGIOGRAM & PTCA;  Surgeon: [place] Sine,  MD;  Location: [facility] CATH [facility];  Service: Cardiovascular; Bifurcation dRCA-RPAD/PLA & rPDA  PTCA.  ? LEFT HEART CATHETERIZATION WITH CORONARY ANGIOGRAM N/A 11/08/2013  ? Procedure: LEFT HEART CATHETERIZATION WITH CORONARY ANGIOGRAM and Coronary Stent Intervention;  Surgeon: Leonie Man, MD;  Location: [facility] CATH LAB;  Service: Cardiovascular; mLAD 80% (FFR 0.73) - resolute DES 3.0 x 22 mm (postdilated to 3.3 mm->3.5 mm)  ? NM MYOVIEW LTD  01/26/2009  ? Normal study, no evidence of ischemia, EF 67%  ? ployp removed from voice box 03/30/12    ? REFRACTIVE SURGERY  ~ 2010  ? right  ? TONSILLECTOMY  1988  ? ? ? ? ?Social History  ? ?Tobacco Use  ?Smoking Status Every Day  ? Packs/day: 0.50  ? Years: 40.00  ? Pack years: 20.00  ? Types: Cigarettes  ?Smokeless Tobacco Never  ?Tobacco Comments  ? 08/14/11 "quit for 3 wk 05/2011; was smoking 1 ppd since age 41 before I quit; at least I've cut down to 1/4 ppd"  ?  ?Social History  ? ?Substance and Sexual Activity  ?Alcohol Use No  ? Alcohol/week: 0.0 standard drinks  ? 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 ? ? ?Allergies  ?Allergen Reactions  ? Amoxicillin Hives  ?  Has patient had a PCN reaction causing immediate rash, facial/tongue/throat swelling, SOB or lightheadedness with hypotension: Yes ?Has patient had a PCN reaction causing severe rash involving mucus membranes or skin necrosis: No ?Has patient had a PCN reaction that required hospitalization No ?Has patient had a PCN reaction occurring within the last 10 years: Yes ?If all of the above answers are "NO", then may proceed with Cephalosporin use.  ? ? ? ?Current Outpatient Medications  ?Medication Sig Dispense Refill  ? ACCU-CHEK AVIVA PLUS test strip TEST BLOOD SUGAR THREE TIMES DAILY AS DIRECTED 100 each 2  ? aspirin EC 81 MG tablet Take 81 mg by mouth daily.    ? atorvastatin (LIPITOR) 40 MG tablet TAKE 1 TABLET (40 MG TOTAL) BY MOUTH DAILY. 90 tablet 3  ? busPIRone (BUSPAR)  15 MG tablet TAKE 1/2 TABLET (7.5 MG TOTAL) BY MOUTH TWICE DAILY 30 tablet 11  ? carvedilol (COREG) 6.25 MG tablet Take 1 tablet (6.25 mg total) by mouth 2 (two) times daily with a meal. (Patient taking differently: Take 12.5 mg by mouth daily.) 60 tablet 11  ? diclofenac sodium (VOLTAREN) 1 % GEL Apply 1 application topically 3 (three) times daily as needed (pain).    ? ferrous sulfate 325 (65 FE) MG tablet Take 325 mg by mouth daily with breakfast.    ? fluticasone (FLONASE) 50 MCG/ACT nasal spray Place 1 spray into both nostrils daily as needed (congestion). 15.8 mL 3  ? ipratropium (ATROVENT HFA) 17 MCG/ACT inhaler Inhale  2 puffs into the lungs every 4 (four) hours as needed for wheezing (COPD).    ? isosorbide mononitrate (IMDUR) 30 MG 24 hr tablet Take 1 tablet (30 mg total) by mouth daily. 90 tablet 3  ? Lidocaine (HM LIDOCAINE PATCH) 4 % PTCH Apply 1 patch topically daily as needed. 12 patch 0  ? linaclotide (LINZESS) 290 MCG CAPS capsule Take 1 capsule (290 mcg total) by mouth daily before breakfast. 30 capsule 3  ? losartan (COZAAR) 50 MG tablet TAKE ONE TABLET BY MOUTH ONCE DAILY 30 tablet 1  ? mometasone (ELOCON) 0.1 % cream Apply 1 application topically 2 (two) times daily as needed (wound care (vaginal boils)).  2  ? nitroGLYCERIN (NITROSTAT) 0.4 MG SL tablet Place 1 tablet (0.4 mg total) under the tongue every 5 (five) minutes as needed for chest pain. 25 tablet 5  ? polyethylene glycol powder (GLYCOLAX/MIRALAX) 17 GM/SCOOP powder Take 17 g by mouth daily. Mix with 8-12 oz of water 3350 g 1  ? tiZANidine (ZANAFLEX) 2 MG tablet Take 1 tablet (2 mg total) by mouth every 8 (eight) hours as needed for muscle spasms. 21 tablet 0  ? TRULICITY 7.10 GY/6.9SW SOPN INJECT 0.75 MG INTO THE SKIN ONCE A WEEK. 2 mL 1  ? valACYclovir (VALTREX) 1000 MG tablet TAKE FOR 3 DAYS AS NEEDED FOR EACH OUTBREAK 30 tablet 11  ? vitamin B-12 (CYANOCOBALAMIN) 1000 MCG tablet Take 1,000 mcg by mouth daily.    ? ?No current  facility-administered medications for this visit.  ? ? ?(Not in a hospital admission) ? ? ?Family History  ?Problem Relation Age of Onset  ? Esophageal cancer Mother   ? COPD Mother   ? Cancer Mother   ?     lympho

## 2021-04-16 ENCOUNTER — Encounter: Payer: Medicaid Other | Admitting: Thoracic Surgery (Cardiothoracic Vascular Surgery)

## 2021-04-28 ENCOUNTER — Other Ambulatory Visit: Payer: Self-pay | Admitting: Family Medicine

## 2021-04-28 DIAGNOSIS — Z794 Long term (current) use of insulin: Secondary | ICD-10-CM

## 2021-04-29 ENCOUNTER — Encounter: Payer: Self-pay | Admitting: Family Medicine

## 2021-04-29 NOTE — Progress Notes (Signed)
?Cardiology Office Note:   ?Date:  05/01/2021  ?NAME:  Cassidy [name]    ?MRN: [id number] ?DOB:  04-Nov-1965  ? ?PCP:  [name], DO  ?Cardiologist:  None  ?Electrophysiologist:  None  ? ?Referring MD: [name], DO  ? ?Chief Complaint  ?Patient presents with  ? Follow-up  ?   ?  ? ? ?History of Present Illness:   ?Cassidy [name] is a 56 y.o. female with a hx of CAD, tobacco abuse, diabetes, hypertension, hyperlipidemia who presents for follow-up.  Sent for coronary angiography due to persistent symptoms of chest discomfort.  Found to have three-vessel CAD. She reports she is now ready to have bypass surgery.  Still having episodes of chest pain.  Described as chest tightness that can be sharp or dull and can occur at rest.  Symptoms can last minutes.  Not so much exertional but does have symptoms quite a bit.  Now that she has three-vessel CAD and diabetes I do agree with bypass surgery.  Her EKG shows sinus rhythm with an old anteroseptal infarct.  Blood pressure well controlled.  LDL at goal.  We do need to recheck her A1c.  She is concerned about wound healing after bypass surgery.  As long as her A1c is below 7 she is likely low risk.  We can check this today.  Still smoking 3 to 4 cigarettes/day.  Cessation was advised.  On aspirin and statin.  All of her other risk factors are well controlled. ? ?Problem List ?CAD ?-2014: POBA dRCA/PDA/PLV ?-2015: PCI to mid LAD  ?-04/09/2021: 3v CAD -> CABG recommended  ?2. HLD ?-T chol 126, HDL 54, LDL 55, TG 90 ?3. DM ?-A1c 6.8 ?4. HTN ?5. Tobacco abuse  ?-40 pack years ? ?Past Medical History: ?Past Medical History:  ?Diagnosis Date  ? Anemia   ? Atherosclerotic heart disease of native coronary artery with angina pectoris ([place]) 05/2012, 10/2013  ? a.  s/p PCTA to dRCA and ostial RPAV-PLA vessel + PDA branch (05/2012)  b. [place] s/p DES to mLAD - Resolute DES 3.0 x 22 (3.74mm -->3.3 mm)  ? Bipolar depression ([place])   ? Breast abscess   ? a. right side.   ? COPD  (chronic obstructive pulmonary disease) ([place])   ? Depression   ? Diabetic peripheral neuropathy ([place])   ? Fibromyalgia   ? Genital warts   ? GERD (gastroesophageal reflux disease)   ? HLD (hyperlipidemia)   ? Hypertension   ? Pain in limb   ? a. LE VENOUS DUPLEX, 02/05/2009 - no evidence of deep vein thrombosis, Baker's cyst  ? PTSD (post-traumatic stress disorder)   ? Sickle cell trait ([place])   ? Tobacco abuse   ? Tooth caries   ? Type II diabetes mellitus ([place])   ? ? ?Past Surgical History: ?Past Surgical History:  ?Procedure Laterality Date  ? BLADDER SURGERY  1980  ? "TVT"  ? BREAST EXCISIONAL BIOPSY    ? BREAST SURGERY Right   ? I&D for multiple abscesses  ? CARDIAC CATHETERIZATION    ? cutting balloon    ? EYE SURGERY Left   ? laser surgery  ? INCISE AND DRAIN ABCESS  ; 04/26/11; 08/13/11  ? right breast  ? INCISION AND DRAINAGE ABSCESS Right 05/09/2012  ? Procedure: INCISION AND DRAINAGE RIGHT BREAST ABSCESS;  Surgeon: Imogene Burn. Georgette Dover, MD;  Location: [facility];  Service: General;  Laterality: Right;  ? INCISION AND DRAINAGE ABSCESS Right 02/08/2014  ?  Procedure: INCISION AND DRAINAGE RIGHT BREAST ABSCESS;  Surgeon: Donnie Mesa, MD;  Location: [facility];  Service: General;  Laterality: Right;  ? INCISION AND DRAINAGE ABSCESS Right 06/14/2014  ? Procedure: INCISION AND DRAINAGE RIGHT BREAST ABSCESS;  Surgeon: Donnie Mesa, MD;  Location: [facility];  Service: General;  Laterality: Right;  ? IRRIGATION AND DEBRIDEMENT ABSCESS  12/19/2010  ? Procedure: IRRIGATION AND DEBRIDEMENT ABSCESS;  Surgeon: Rolm Bookbinder, MD;  Location: [facility];  Service: General;  Laterality: Right;  ? IRRIGATION AND DEBRIDEMENT ABSCESS  08/13/2011  ? Procedure: MINOR INCISION AND DRAINAGE OF ABSCESS;  Surgeon: Gwenyth Ober, MD;  Location: [facility];  Service: General;  Laterality: Right;  Right Breast ?  ? IRRIGATION AND DEBRIDEMENT ABSCESS  11/17/2011  ? Procedure: IRRIGATION AND DEBRIDEMENT ABSCESS;  Surgeon: Imogene Burn. Georgette Dover, MD;  Location: [facility];   Service: General;  Laterality: Right;  irrigation and debridement right recurrent breast abscess  ? LARYNX SURGERY    ? LEFT HEART CATH AND CORONARY ANGIOGRAPHY N/A 04/09/2021  ? Procedure: LEFT HEART CATH AND CORONARY ANGIOGRAPHY;  Surgeon: Jettie Booze, MD;  Location: [facility];  Service: Cardiovascular;  Laterality: N/A;  ? LEFT HEART CATHETERIZATION WITH CORONARY ANGIOGRAM N/A 05/29/2012  ? Procedure: LEFT HEART CATHETERIZATION WITH CORONARY ANGIOGRAM & PTCA;  Surgeon: [place] Sine, MD;  Location: [facility] CATH LAB;  Service: Cardiovascular; Bifurcation dRCA-RPAD/PLA & rPDA  PTCA.  ? LEFT HEART CATHETERIZATION WITH CORONARY ANGIOGRAM N/A 11/08/2013  ? Procedure: LEFT HEART CATHETERIZATION WITH CORONARY ANGIOGRAM and Coronary Stent Intervention;  Surgeon: Leonie Man, MD;  Location: [facility] CATH LAB;  Service: Cardiovascular; mLAD 80% (FFR 0.73) - resolute DES 3.0 x 22 mm (postdilated to 3.3 mm->3.5 mm)  ? NM MYOVIEW LTD  01/26/2009  ? Normal study, no evidence of ischemia, EF 67%  ? ployp removed from voice box 03/30/12    ? REFRACTIVE SURGERY  ~ 2010  ? right  ? TONSILLECTOMY  1988  ? ? ?Current Medications: ?Current Meds  ?Medication Sig  ? ACCU-CHEK AVIVA PLUS test strip TEST BLOOD SUGAR THREE TIMES DAILY AS DIRECTED  ? aspirin EC 81 MG tablet Take 81 mg by mouth daily.  ? atorvastatin (LIPITOR) 40 MG tablet TAKE 1 TABLET (40 MG TOTAL) BY MOUTH DAILY.  ? busPIRone (BUSPAR) 15 MG tablet TAKE 1/2 TABLET (7.5 MG TOTAL) BY MOUTH TWICE DAILY  ? diclofenac sodium (VOLTAREN) 1 % GEL Apply 1 application topically 3 (three) times daily as needed (pain).  ? ferrous sulfate 325 (65 FE) MG tablet Take 325 mg by mouth daily with breakfast.  ? fluticasone (FLONASE) 50 MCG/ACT nasal spray Place 1 spray into both nostrils daily as needed (congestion).  ? ipratropium (ATROVENT HFA) 17 MCG/ACT inhaler Inhale 2 puffs into the lungs every 4 (four) hours as needed for wheezing (COPD).  ? isosorbide mononitrate (IMDUR)  30 MG 24 hr tablet Take 1 tablet (30 mg total) by mouth daily.  ? Lidocaine (HM LIDOCAINE PATCH) 4 % PTCH Apply 1 patch topically daily as needed.  ? linaclotide (LINZESS) 290 MCG CAPS capsule Take 1 capsule (290 mcg total) by mouth daily before breakfast.  ? losartan (COZAAR) 50 MG tablet TAKE ONE TABLET BY MOUTH ONCE DAILY  ? mometasone (ELOCON) 0.1 % cream Apply 1 application topically 2 (two) times daily as needed (wound care (vaginal boils)).  ? nitroGLYCERIN (NITROSTAT) 0.4 MG SL tablet Place 1 tablet (0.4 mg total) under the tongue every 5 (five) minutes as  needed for chest pain.  ? polyethylene glycol powder (GLYCOLAX/MIRALAX) 17 GM/SCOOP powder Take 17 g by mouth daily. Mix with 8-12 oz of water  ? tiZANidine (ZANAFLEX) 2 MG tablet Take 1 tablet (2 mg total) by mouth every 8 (eight) hours as needed for muscle spasms.  ? TRULICITY 2.13 YQ/6.5HQ SOPN INJECT 0.75 MG INTO THE SKIN ONCE A WEEK.  ? valACYclovir (VALTREX) 1000 MG tablet TAKE FOR 3 DAYS AS NEEDED FOR EACH OUTBREAK  ? vitamin B-12 (CYANOCOBALAMIN) 1000 MCG tablet Take 1,000 mcg by mouth daily.  ? [DISCONTINUED] carvedilol (COREG) 6.25 MG tablet Take 1 tablet (6.25 mg total) by mouth 2 (two) times daily with a meal. (Patient taking differently: Take 12.5 mg by mouth daily.)  ?  ? ?Allergies:    ?Amoxicillin  ? ?Social History: ?Social History  ? ?Socioeconomic History  ? Marital status: Single  ?  Spouse name: Not on file  ? Number of children: 1  ? Years of education: Not on file  ? Highest education level: Not on file  ?Occupational History  ? Occupation: homemaker  ? Occupation: disabled  ?Tobacco Use  ? Smoking status: Every Day  ?  Packs/day: 0.50  ?  Years: 40.00  ?  Pack years: 20.00  ?  Types: Cigarettes  ? Smokeless tobacco: Never  ? Tobacco comments:  ?  08/14/11 "quit for 3 wk 05/2011; was smoking 1 ppd since age 89 before I quit; at least I've cut down to 1/4 ppd"  ?Vaping Use  ? Vaping Use: Never used  ?Substance and Sexual Activity  ?  Alcohol use: No  ?  Alcohol/week: 0.0 standard drinks  ?  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 Drug use: Yes  ?  Comment: 08/14/11 'quit everything in 2005"  ?

## 2021-05-01 ENCOUNTER — Encounter: Payer: Self-pay | Admitting: Cardiovascular Disease

## 2021-05-01 ENCOUNTER — Ambulatory Visit (INDEPENDENT_AMBULATORY_CARE_PROVIDER_SITE_OTHER): Payer: Medicaid Other | Admitting: Cardiovascular Disease

## 2021-05-01 VITALS — BP 124/68 | HR 77 | Ht 67.5 in | Wt 213.0 lb

## 2021-05-01 DIAGNOSIS — E782 Mixed hyperlipidemia: Secondary | ICD-10-CM

## 2021-05-01 DIAGNOSIS — I10 Essential (primary) hypertension: Secondary | ICD-10-CM

## 2021-05-01 DIAGNOSIS — I25110 Atherosclerotic heart disease of native coronary artery with unstable angina pectoris: Secondary | ICD-10-CM | POA: Diagnosis not present

## 2021-05-01 DIAGNOSIS — Z720 Tobacco use: Secondary | ICD-10-CM

## 2021-05-01 MED ORDER — CARVEDILOL 12.5 MG PO TABS: 12.5000 mg | ORAL_TABLET | Freq: Two times a day (BID) | ORAL | 1 refills | Status: DC

## 2021-05-01 NOTE — Patient Instructions (Signed)
Medication Instructions:  ?Increase Coreg to 12.5 mg twice daily  ? ?*If you need a refill on your cardiac medications before your next appointment, please call your pharmacy* ? ? ?Lab Work: ?A1C today  ? ?If you have labs (blood work) drawn today and your tests are completely normal, you will receive your results only by: ?MyChart Message (if you have MyChart) OR ?A paper copy in the mail ?If you have any lab test that is abnormal or we need to change your treatment, we will call you to review the results. ? ? ?Follow-Up: ?At [facility], you and your health needs are our priority.  As part of our continuing mission to provide you with exceptional heart care, we have created designated Provider Care Teams.  These Care Teams include your primary Cardiologist (physician) and Advanced Practice Providers (APPs -  Physician Assistants and Nurse Practitioners) who all work together to provide you with the care you need, when you need it. ? ?We recommend signing up for the patient portal called "MyChart".  Sign up information is provided on this After Visit Summary.  MyChart is used to connect with patients for Virtual Visits (Telemedicine).  Patients are able to view lab/test results, encounter notes, upcoming appointments, etc.  Non-urgent messages can be sent to your provider as well.   ?To learn more about what you can do with MyChart, go to [email or web address].   ? ?Your next appointment:   ?4 month(s) ? ?The format for your next appointment:   ?In Person ? ?Provider:   ?Eleonore Chiquito, MD  ? ? ? ?Important Information About Sugar ? ? ? ? ? ? ?

## 2021-05-05 NOTE — Progress Notes (Signed)
? ? ?SUBJECTIVE:  ? ?CHIEF COMPLAINT / HPI:  ? ?Cassidy [name] is a 56 y.o. female who presents to the [facility] clinic today for diabetes follow up. She states that she is scheduled for a triple bypass next month- this is stressing her out. She also mentions that this is her mother death month which is why she didn't want to schedule procedure this month. Her son is also worried about her. ? ?Diabetes, Type 2 ?- Last A1c 6.8 in April 2022 ?- Medications: Trulicity 7.59 mg/weekly  ?- Compliance: Great ?- Checking BG at home: Checking twice a day ?- Diet: Eating more vegetables and baked foods. Limits fried foods.  ?- Exercise: Walks in the park for 1 hour twice a week.  ?- Eye exam: Due in August  ?- Foot exam: Will do today ?- Microalbumin: Due ?- Statin: Atorvastatin 40 mg. Lipid panel today. ?- Denies symptoms of hypoglycemia, polyuria, polydipsia, numbness extremities, foot ulcers/trauma ? ?Hypertension ?Brings home BP log with her today, numbers range 150-160s/90s-103. Has wrist BP monitor. She is currently taking carvedilol 12.5 mg BID, imdur 30 mg, losartan 50 mg. She is complaint with her medications, has no side effects from them. Denies chest pain, headaches, lower extremity swelling or difficulty breathing. Has chronic vision troubles- has bifocals which help.  ? ?Anxiety/Depression ?Has no plans to harm herself but states "sometimes I don't want to be here". States that severe depression runs in her family. She states that she was previously dx with bipolar but a therapist told her she didn't have this and she had ADHD. Does occasionally have impulsive shopping sometimes. Has gone 1-2 days without sleep but felt like she was walking around like a zombie. Interested in therapy. ? ?PERTINENT  PMH / PSH:  ?Past Medical History:  ?Diagnosis Date  ? Anemia   ? Atherosclerotic heart disease of native coronary artery with angina pectoris ([place]) 05/2012, 10/2013  ? a.  s/p PCTA to dRCA and ostial RPAV-PLA vessel + PDA  branch (05/2012)  b. [place] s/p DES to mLAD - Resolute DES 3.0 x 22 (3.8mm -->3.3 mm)  ? Bipolar depression ([place])   ? Breast abscess   ? a. right side.   ? COPD (chronic obstructive pulmonary disease) ([place])   ? Depression   ? Diabetic peripheral neuropathy ([place])   ? Fibromyalgia   ? Genital warts   ? GERD (gastroesophageal reflux disease)   ? HLD (hyperlipidemia)   ? Hypertension   ? Pain in limb   ? a. LE VENOUS DUPLEX, 02/05/2009 - no evidence of deep vein thrombosis, Baker's cyst  ? PTSD (post-traumatic stress disorder)   ? Sickle cell trait ([place])   ? Tobacco abuse   ? Tooth caries   ? Type II diabetes mellitus ([place])   ? ?OBJECTIVE:  ? ?BP (!) 158/86   Pulse 93   Wt 211 lb 9.6 oz (96 kg)   LMP 06/12/2016 (Approximate)   SpO2 100%   BMI 32.65 kg/m?   ?Vitals:  ? 05/15/21 1522 05/15/21 1616  ?BP: (!) 153/97 (!) 158/86  ?Pulse: 93   ?SpO2: 100%   ? ?General: Depressed affect, cooperative with examination, intermittently tearful ?Cardiac: RRR, no murmurs. ?Respiratory: CTAB, normal effort, No wheezes, rales or rhonchi ?Foot exam: No deformities, ulcerations, or other skin breakdown on feet bilaterally.  Sensation intact to monofilament and light touch.  PT and DP pulses intact BL.   ?Extremities: no edema or cyanosis. ?Psych: Depressed and flat affect ? ? ?  05/15/2021  ?  3:23 PM 06/11/2020  ?  9:00 AM 05/22/2020  ?  2:55 PM  ?Depression screen PHQ 2/9  ?Decreased Interest 2 1 0  ?Down, Depressed, Hopeless 2 1 0  ?PHQ - 2 Score 4 2 0  ?Altered sleeping 1 3 1   ?Tired, decreased energy 1 3 1   ?Change in appetite 1 1 0  ?Feeling bad or failure about yourself  0 0 0  ?Trouble concentrating 1 1 0  ?Moving slowly or fidgety/restless 1 0 1  ?Suicidal thoughts 0 0 0  ?PHQ-9 Score 9 10 3   ?Difficult doing work/chores Somewhat difficult Somewhat difficult Somewhat difficult  ? ? ? ?ASSESSMENT/PLAN:  ? ?Essential hypertension ?Elevated today. On coreg, imdur, and losartan. Reports good compliance. Certainly stress could be playing  a role but given her CAD, would like to optimize BP some more.  ?-Continue coreg, imdur, losartan ?-BMP is up to date ?-START Amlodipine 5 mg daily  ?-Continue to monitor BP ?-Return precautions provided. ? ?Type 2 diabetes mellitus without complications ([place]) ?A1c is at goal and even improved from prior. She is on statin and ARB for renal protection.  ?-Continue trulicity 8.84 mg/weekly ?-No need to check CBGs but patient can continue to do so since she desires this ?-Eye exam UTD, next due in August (per patient report) ?-Diabetic foot examination today ?-Urine microalbumin today ?-Next f/u in 6 months  ? ?Depressed mood ?Dealing with several stressors including death anniversary for her mother as well as her triple bypass surgery next month. She denies any active plan for self-harm but does have passive SI on occasion. She is interested in therapy and list of therapists provided today. She is currently only on Buspar. There is unclear history of whether she actually has bipolar or not. 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 MDQ negative today, GAD-7 with score of 8- probable anxiety disorder. ?-Continue BusPAR ?-List of counselors provided ?-Start Sertraline 25 mg x1 week then increase to 50 mg  ?-F/u in 2 weeks virtual or in person ?  ? ?[name], DO ?[facility] [place]  ? ?

## 2021-05-12 ENCOUNTER — Other Ambulatory Visit: Payer: Self-pay | Admitting: Family Medicine

## 2021-05-12 ENCOUNTER — Other Ambulatory Visit: Payer: Self-pay | Admitting: Gastroenterology

## 2021-05-13 ENCOUNTER — Other Ambulatory Visit: Payer: Self-pay | Admitting: *Deleted

## 2021-05-13 DIAGNOSIS — I2510 Atherosclerotic heart disease of native coronary artery without angina pectoris: Secondary | ICD-10-CM

## 2021-05-14 ENCOUNTER — Encounter: Payer: Self-pay | Admitting: *Deleted

## 2021-05-15 ENCOUNTER — Other Ambulatory Visit: Payer: Self-pay

## 2021-05-15 ENCOUNTER — Ambulatory Visit (INDEPENDENT_AMBULATORY_CARE_PROVIDER_SITE_OTHER): Payer: Medicaid Other | Admitting: Family Medicine

## 2021-05-15 ENCOUNTER — Encounter: Payer: Self-pay | Admitting: Family Medicine

## 2021-05-15 VITALS — BP 158/86 | HR 93 | Wt 211.6 lb

## 2021-05-15 DIAGNOSIS — I10 Essential (primary) hypertension: Secondary | ICD-10-CM

## 2021-05-15 DIAGNOSIS — E119 Type 2 diabetes mellitus without complications: Secondary | ICD-10-CM

## 2021-05-15 DIAGNOSIS — I25110 Atherosclerotic heart disease of native coronary artery with unstable angina pectoris: Secondary | ICD-10-CM | POA: Diagnosis not present

## 2021-05-15 DIAGNOSIS — R4589 Other symptoms and signs involving emotional state: Secondary | ICD-10-CM | POA: Diagnosis not present

## 2021-05-15 LAB — POCT GLYCOSYLATED HEMOGLOBIN (HGB A1C): HbA1c, POC (controlled diabetic range): 6.3 % (ref 0.0–7.0)

## 2021-05-15 LAB — HEMOGLOBIN A1C
Est. average glucose Bld gHb Est-mCnc: 143 mg/dL
Hgb A1c MFr Bld: 6.6 % — ABNORMAL HIGH (ref 4.8–5.6)

## 2021-05-15 MED ORDER — AMLODIPINE BESYLATE 5 MG PO TABS: 5.0000 mg | ORAL_TABLET | Freq: Every day | ORAL | 3 refills | Status: DC

## 2021-05-15 MED ORDER — SERTRALINE HCL 50 MG PO TABS: 50.0000 mg | ORAL_TABLET | Freq: Every day | ORAL | 3 refills | Status: DC

## 2021-05-15 NOTE — Assessment & Plan Note (Signed)
Elevated today. On coreg, imdur, and losartan. Reports good compliance. Certainly stress could be playing a role but given her CAD, would like to optimize BP some more.  ?-Continue coreg, imdur, losartan ?-BMP is up to date ?-START Amlodipine 5 mg daily  ?-Continue to monitor BP ?-Return precautions provided. ?

## 2021-05-15 NOTE — Patient Instructions (Addendum)
It was wonderful to see you today. ? ?Please bring ALL of your medications with you to every visit.  ? ?Today we talked about: ? ?-I am starting you on another medication for your blood pressure. It is called Amlodipine. Take 5 mg daily. Continue your other medications as well. ?-I am starting you on a medication for your depressed mood, called Sertraline. Take half a tablet for 1 week and then increase to one tablet daily.  ?-I would like for you to follow up in 2 weeks. This can be virtual and will be for a mood check and to check your blood pressure (you can take it at home). ?-Your diabetes is very well controlled! Don't feel like you need to check your sugars unless you want to. We are doing tests to check your cholesterol and kidney today. ? ?Therapy and Counseling Resources ?Most providers on this list will take Medicaid. Patients with commercial insurance or Medicare should contact their insurance company to get a list of in network providers. ? ?[facility] (spanish speaking therapist available)(habla espanol)(take medicare and medicaid)  ?[place], [place], [place] [address], [place] ?[email or web address] ?[phone] ? ?[facility] ?[address] [place], [place] [address] ?[phone] ? ?[facility]  ? [address], [place], [place] [address]      [phone] ? ?[facility] (spanish available) ?[place], [place] [address] ?[phone] ? ?[facility] (take medicaid and medicare) ?[address], [place], [place] [address]       [phone]    ? ?[facility] (virtual only) ?[phone] ? ?[facility] ?[address] [place], [place] ? ?[facility]:  [address] ? ?[facility]:  ?[place] [phone] ? ?[facility] (under & uninsured) ?[address], [place] [phone]    [email or web address]   ? ?[facility] ?[place] [address]  [place]    [phone] ? ?[facility] ?[place]     Phone:  [phone]     [place]  [phone]  ? ?[facility] ?[address] [place], [place] ext 1001 ? ?[facility]: [place], [place], [place]  ? ?[facility] ([place] therapist) [email or web address]  ?[place] [address]   [place]  [address]    [phone]   ? ?[facility]   ?[address] [place],  [place], [place]  ? ?[facility]  ?[address] [place]  [phone] ? ?[facility]  ?[place], [place]        [phone] ? ?Open Access/Walk In Clinic under & uninsured ? ?[facility]  ?[place], [place] ?[place] [phone] ?[place] [phone] ? ?[facility],  ?([place])   [place]: [phone]) 8:30 - 12; 1 - 2:30 ? ?[facility],  ?[address], [place]    [phone]):8:30 - 12; 2 - 3PM ? ?[facility],  ?[address]  [place]; [phone]):   Mon - Fri 8 AM - 5 PM ? ?[facility] ?[place]  [place]  MWF 12:30 to 3:00 or call to schedule an appointment  [phone] ? ?Specific Provider options ?[facility]  [email or web address] ?click on find a therapist  ?enter your zip code ?left side and select or tailor a therapist for your specific need.  ? ?[facility] Provider Directory ?[email or web address]  (Medicaid)   Follow all drop down to find a provider ? ?Social Support program ?[facility] ?336) H3156881 or [email or web address] ?[address],  [place], [place] Recovery support and educational  ? ?24- Hour Availability:  ? ?[facility]  ?[place], [place] ?[place] [phone] ?[place] [phone] ? ?[facility] [employer] [phone] ? ?[facility]  [phone]  ? ?[place]  [phone] (after hours) ? ?[facility]/Mobile Crisis   [phone] ? ?[place] National Suicide Hotline  ([phone] [place]antina Monks) ? ?Call 911 or go to emergency room ? ?[employer]  [phone]);  [place] and [place]  ? ?[facility]  ?[phone]); [place], [place], [place], [place], [place], [place], [place] ? ? ?Thank you for choosing [facility].  ? ?Please call [phone] with any questions about today's appointment. ? ?Please be sure to schedule follow up at the front  desk before you leave today.  ? ?[name], DO ?PGY-2 Family Medicine   ?

## 2021-05-15 NOTE — Assessment & Plan Note (Addendum)
Dealing with several stressors including death anniversary for her mother as well as her triple bypass surgery next month. She denies any active plan for self-harm but does have passive SI on occasion. She is interested in therapy and list of therapists provided today. She is currently only on Buspar. There is unclear history of whether she actually has bipolar or not. 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 MDQ negative today, GAD-7 with score of 8- probable anxiety disorder. ?-Continue BusPAR ?-List of counselors provided ?-Start Sertraline 25 mg x1 week then increase to 50 mg  ?-F/u in 2 weeks virtual or in person ?

## 2021-05-15 NOTE — Assessment & Plan Note (Signed)
A1c is at goal and even improved from prior. She is on statin and ARB for renal protection.  ?-Continue trulicity 6.57 mg/weekly ?-No need to check CBGs but patient can continue to do so since she desires this ?-Eye exam UTD, next due in August (per patient report) ?-Diabetic foot examination today ?-Urine microalbumin today ?-Next f/u in 6 months  ?

## 2021-05-16 LAB — LIPID PANEL
Chol/HDL Ratio: 2.2 ratio (ref 0.0–4.4)
Cholesterol, Total: 131 mg/dL (ref 100–199)
HDL: 60 mg/dL (ref >39)
LDL Chol Calc (NIH): 56 mg/dL (ref 0–99)
Triglycerides: 77 mg/dL (ref 0–149)
VLDL Cholesterol Cal: 15 mg/dL (ref 5–40)

## 2021-05-16 LAB — MICROALBUMIN / CREATININE URINE RATIO
Creatinine, Urine: 35.9 mg/dL
Microalb/Creat Ratio: 14 mg/g creat (ref 0–29)
Microalbumin, Urine: 5.1 ug/mL

## 2021-05-22 ENCOUNTER — Other Ambulatory Visit: Payer: Self-pay | Admitting: Family Medicine

## 2021-05-22 DIAGNOSIS — I10 Essential (primary) hypertension: Secondary | ICD-10-CM

## 2021-05-28 ENCOUNTER — Other Ambulatory Visit: Payer: Self-pay | Admitting: Family Medicine

## 2021-05-28 DIAGNOSIS — Z1231 Encounter for screening mammogram for malignant neoplasm of breast: Secondary | ICD-10-CM

## 2021-05-29 NOTE — Progress Notes (Signed)
SUBJECTIVE:   CHIEF COMPLAINT / HPI:   Hypertension Follow Up Currently on Coreg, Imdur, losartan, amlodipine started at last visit. Home BP readings range systolics 409-811B and diastolics 14-NWG 956O. Most BP have been <140/95.  Denies chest pain, SOB, headaches, lower extremity edema, vision changes.   Depressed Mood Started on Sertraline last visit, also on BusPAR. Also provided with list of counselors. She has been tolerating medication without adverse effects.  Has not yet found a therapist- she would like to wait until her surgery (triple bypass). She feels like mood is better, she has been crying less. She has been walking, she is very proud of her 4 lbs weight loss since last visit! She feels that mood is improved.   Shortness Of Breath Mostly on exertion. Notes history COPD. Has had to use Albuterol inhaler twice this week. She is not on any controller medications. Has never seen a pulmonologist outpatient. She is still smoking but only smoking 3 cigarettes/day. She is interested in cessation. No chest pain. She is still able to do all ADL's. She was wanting a nebulizer machine as hers is broken.   PERTINENT  PMH / PSH:  Past Medical History:  Diagnosis Date   Anemia    Atherosclerotic heart disease of native coronary artery with angina pectoris ([facility]) 05/2012, 10/2013   a.  s/p PCTA to dRCA and ostial RPAV-PLA vessel + PDA branch (05/2012)  b. [place] s/p DES to mLAD - Resolute DES 3.0 x 22 (3.21mm -->3.3 mm)   Bipolar depression ([facility])    Breast abscess    a. right side.    COPD (chronic obstructive pulmonary disease) (HCC)    Depression    Diabetic peripheral neuropathy (HCC)    Fibromyalgia    Genital warts    GERD (gastroesophageal reflux disease)    HLD (hyperlipidemia)    Hypertension    Pain in limb    a. LE VENOUS DUPLEX, 02/05/2009 - no evidence of deep vein thrombosis, Baker's cyst   PTSD (post-traumatic stress disorder)    Sickle cell trait (HCC)    Tobacco abuse     Tooth caries    Type II diabetes mellitus (HCC)    OBJECTIVE:   BP 116/77   Pulse 91   Wt 207 lb (93.9 kg)   LMP 06/12/2016 (Approximate)   SpO2 98%   BMI 31.94 kg/m    General: NAD, pleasant, able to participate in exam Cardiac: RRR Respiratory: CTAB, normal effort, No wheezes, rales or rhonchi Extremities: no edema or cyanosis. Skin: warm and dry, no rashes noted Psych: Normal affect and mood     06/03/2021   11:15 AM 05/15/2021    3:23 PM 06/11/2020    9:00 AM  Depression screen PHQ 2/9  Decreased Interest 1 2 1   Down, Depressed, Hopeless 0 2 1  PHQ - 2 Score 1 4 2   Altered sleeping 0 1 3  Tired, decreased energy 1 1 3   Change in appetite 2 1 1   Feeling bad or failure about yourself  0 0 0  Trouble concentrating 0 1 1  Moving slowly or fidgety/restless 0 1 0  Suicidal thoughts 0 0 0  PHQ-9 Score 4 9 10   Difficult doing work/chores Not difficult at all Somewhat difficult Somewhat difficult     ASSESSMENT/PLAN:   Essential hypertension Blood pressure much improved today in the office.  She is tolerating all of her medications well.  She did bring her home BP monitor with her  and still intermittently has high blood pressures but most appear to be normotensive.  Congratulated her on her 4 pound weight loss. -Continue Coreg, Imdur, losartan, amlodipine; no dose adjustments at this time -Continue home BP monitoring -Continue exercise -Encouraged tobacco cessation  Tobacco abuse She has put in an effort to cut back on smoking.  She is down to 3 cigarettes a day.  She is interested in complete cessation.  She has tried Chantix in the past and had nightmares with this.  She reports having a skin reaction to nicotine patches in the past.  She is amenable to trying gum. -Rx nicotine gum; instructed patient how to use -Also provided with 1 800 quit now hotline  CAD S/P percutaneous coronary angioplasty She is scheduled to undergo triple bypass in June.  Patient appears  more mentally prepared for this today. Encouraged tobacco cessation to help with recovery- she seems motivated. -Continue beta blocker, statin  Depressed mood PHQ-9 today mildly improved.  She seemed in better spirits and was smiling and laughing throughout visit.  She was intermittently tearful at times when discussing life stressors but this was appropriate. She has not yet found a therapist but states she will seek one after surgery. Exercise has helped with mood as well. Offered to increase her Sertraline but she opted to continue at current dose for now which I feel is also appropriate. -Continue Sertraline 50 mg -Encouraged therapy -Continue exercising  Shortness of breath Reports intermittent shortness of breath mostly on exertion.  Able to do all ADLs.  She notes previous history of COPD. Not on any controller medications now so I have started her on Breo Ellipta. Placed DME order for nebulizer. Appears that she has not had PFTs in the past.  I have scheduled her with Dr. Valentina Lucks for this in June. Instructed to hold Albuterol/Breo prior to appointment.      [name], [place]

## 2021-06-02 ENCOUNTER — Telehealth: Payer: Self-pay

## 2021-06-02 NOTE — Telephone Encounter (Signed)
Prior Auth for patients medication TRULICITY approved by [employer] from 06/02/21 to 06/03/22.  Key: [id number]  Patients pharmacy notified.

## 2021-06-02 NOTE — Telephone Encounter (Signed)
A Prior Authorization was initiated for this patients TRULICITY through CoverMyMeds.   Key: [id number]

## 2021-06-03 ENCOUNTER — Ambulatory Visit (INDEPENDENT_AMBULATORY_CARE_PROVIDER_SITE_OTHER): Payer: Medicaid Other | Admitting: Family Medicine

## 2021-06-03 ENCOUNTER — Encounter: Payer: Self-pay | Admitting: Family Medicine

## 2021-06-03 ENCOUNTER — Other Ambulatory Visit (HOSPITAL_COMMUNITY): Payer: Self-pay

## 2021-06-03 ENCOUNTER — Other Ambulatory Visit: Payer: Self-pay

## 2021-06-03 VITALS — BP 116/77 | HR 91 | Wt 207.0 lb

## 2021-06-03 DIAGNOSIS — R4589 Other symptoms and signs involving emotional state: Secondary | ICD-10-CM | POA: Diagnosis not present

## 2021-06-03 DIAGNOSIS — I2510 Atherosclerotic heart disease of native coronary artery without angina pectoris: Secondary | ICD-10-CM

## 2021-06-03 DIAGNOSIS — J449 Chronic obstructive pulmonary disease, unspecified: Secondary | ICD-10-CM

## 2021-06-03 DIAGNOSIS — Z716 Tobacco abuse counseling: Secondary | ICD-10-CM

## 2021-06-03 DIAGNOSIS — Z9861 Coronary angioplasty status: Secondary | ICD-10-CM | POA: Diagnosis not present

## 2021-06-03 DIAGNOSIS — R0602 Shortness of breath: Secondary | ICD-10-CM | POA: Diagnosis not present

## 2021-06-03 DIAGNOSIS — Z720 Tobacco use: Secondary | ICD-10-CM

## 2021-06-03 DIAGNOSIS — I10 Essential (primary) hypertension: Secondary | ICD-10-CM | POA: Diagnosis not present

## 2021-06-03 MED ORDER — FLUTICASONE FUROATE-VILANTEROL 100-25 MCG/ACT IN AEPB: 1.0000 | INHALATION_SPRAY | Freq: Every day | RESPIRATORY_TRACT | 1 refills | Status: DC

## 2021-06-03 MED ORDER — NICOTINE POLACRILEX 2 MG MT GUM: 2.0000 mg | CHEWING_GUM | OROMUCOSAL | 0 refills | Status: DC | PRN

## 2021-06-03 NOTE — Assessment & Plan Note (Signed)
PHQ-9 today mildly improved.  She seemed in better spirits and was smiling and laughing throughout visit.  She was intermittently tearful at times when discussing life stressors but this was appropriate. She has not yet found a therapist but states she will seek one after surgery. Exercise has helped with mood as well. Offered to increase her Sertraline but she opted to continue at current dose for now which I feel is also appropriate. -Continue Sertraline 50 mg -Encouraged therapy -Continue exercising

## 2021-06-03 NOTE — Assessment & Plan Note (Signed)
She has put in an effort to cut back on smoking.  She is down to 3 cigarettes a day.  She is interested in complete cessation.  She has tried Chantix in the past and had nightmares with this.  She reports having a skin reaction to nicotine patches in the past.  She is amenable to trying gum. -Rx nicotine gum; instructed patient how to use -Also provided with 1 800 quit now hotline

## 2021-06-03 NOTE — Assessment & Plan Note (Signed)
Reports intermittent shortness of breath mostly on exertion.  Able to do all ADLs.  She notes previous history of COPD. Not on any controller medications now so I have started her on Breo Ellipta. Placed DME order for nebulizer. Appears that she has not had PFTs in the past.  I have scheduled her with Dr. Valentina Lucks for this in June. Instructed to hold Albuterol/Breo prior to appointment.

## 2021-06-03 NOTE — Assessment & Plan Note (Signed)
Blood pressure much improved today in the office.  She is tolerating all of her medications well.  She did bring her home BP monitor with her and still intermittently has high blood pressures but most appear to be normotensive.  Congratulated her on her 4 pound weight loss. -Continue Coreg, Imdur, losartan, amlodipine; no dose adjustments at this time -Continue home BP monitoring -Continue exercise -Encouraged tobacco cessation

## 2021-06-03 NOTE — Patient Instructions (Signed)
It was wonderful to see you today.  Please bring ALL of your medications with you to every visit.   Today we talked about:  -I am so glad to see that you are doing better! -I am sending in a DME order for a nebulizer machine -I am starting you on breo ellipta, take 1 puff daily. Only use the Albuterol inhaler as needed.  -Continue with the Sertraline. I would encourage you to still find a therapist! -Your blood pressure is much better! Continue your medications.    Thank you for choosing [facility].   Please call [phone] with any questions about today's appointment.  Please be sure to schedule follow up at the front  desk before you leave today.   [name], DO PGY-2 Family Medicine

## 2021-06-03 NOTE — Assessment & Plan Note (Addendum)
She is scheduled to undergo triple bypass in June.  Patient appears more mentally prepared for this today. Encouraged tobacco cessation to help with recovery- she seems motivated. -Continue beta blocker, statin

## 2021-06-04 ENCOUNTER — Ambulatory Visit
Admission: RE | Admit: 2021-06-04 | Discharge: 2021-06-04 | Disposition: A | Discharge status: Home / Self Care | Payer: Medicaid Other | Source: Ambulatory Visit | Attending: Obstetrics | Admitting: Obstetrics

## 2021-06-04 ENCOUNTER — Telehealth: Payer: Self-pay

## 2021-06-04 ENCOUNTER — Other Ambulatory Visit (HOSPITAL_COMMUNITY): Payer: Self-pay

## 2021-06-04 ENCOUNTER — Telehealth: Payer: Self-pay | Admitting: *Deleted

## 2021-06-04 DIAGNOSIS — Z1231 Encounter for screening mammogram for malignant neoplasm of breast: Secondary | ICD-10-CM

## 2021-06-04 NOTE — Telephone Encounter (Signed)
Community message sent to [facility] team.  Cassidy [name]

## 2021-06-04 NOTE — Telephone Encounter (Signed)
A Prior Authorization was initiated for this patients BREO through CoverMyMeds.   Key:  [employer]  (INSURANCE PREFERS [place]IR, [place], [place])

## 2021-06-04 NOTE — Telephone Encounter (Signed)
-----   Message from [name], DO sent at 06/03/2021 11:51 AM EDT ----- Regarding: DME order Hi!  I placed a DME order for nebulizer machine for this patient.  Just want to let you know.   Thank you! Dawson Bills

## 2021-06-05 ENCOUNTER — Other Ambulatory Visit: Payer: Self-pay | Admitting: Family Medicine

## 2021-06-05 DIAGNOSIS — J449 Chronic obstructive pulmonary disease, unspecified: Secondary | ICD-10-CM | POA: Diagnosis not present

## 2021-06-05 MED ORDER — FLUTICASONE-SALMETEROL 100-50 MCG/ACT IN AEPB
1.0000 | INHALATION_SPRAY | Freq: Two times a day (BID) | RESPIRATORY_TRACT | 3 refills | Status: DC
Start: 2021-06-05 — End: 2021-11-23

## 2021-06-05 NOTE — Progress Notes (Signed)
Switching from Breo Ellipta to Advair discus per Medicaid preferred formulary.

## 2021-06-05 NOTE — Telephone Encounter (Signed)
Prior Auth for patients medication BREO denied by [place] via CoverMyMeds.   Reason: May be covered when you fail TWO preferred drugs, or you cannot use two preferred drugs  CoverMyMeds Key: [id number]

## 2021-06-16 ENCOUNTER — Ambulatory Visit (INDEPENDENT_AMBULATORY_CARE_PROVIDER_SITE_OTHER): Payer: Medicaid Other | Admitting: Pharmacist

## 2021-06-16 ENCOUNTER — Encounter: Payer: Self-pay | Admitting: Pharmacist

## 2021-06-16 ENCOUNTER — Encounter: Payer: Self-pay | Admitting: *Deleted

## 2021-06-16 DIAGNOSIS — R0602 Shortness of breath: Secondary | ICD-10-CM | POA: Diagnosis not present

## 2021-06-16 NOTE — Progress Notes (Signed)
   S:    Patient arrives in good spirits, she expresses some level of anxious feeling with pending bypass surgery and her frequent physician visits over the past few weeks. She ambulates without any assistance.    Presents for lung function evaluation.    Patient was referred and last seen by Primary Care Provider. Dr. Nita Sells on 06/03/2021.   Patient reports breathing has been challenging with exertion and climbing stairs.   Medication adherence reported good.  She reports taking Advair twice daily since purchase last week.  Patient reports last dose of COPD medications was THIS Morning.  Current COPD medications: Old - expired albuterol MDI Rescue inhaler use frequency: minimal use (expired and cannot get another due to pharmacy limits on inhalers)  Continues to smoke 3 cigarettes per day.  Plans to quit prior to bypass procedure in two weeks.   O: Physical Exam  Review of Systems  Respiratory:  Positive for cough, sputum production and shortness of breath.   Cardiovascular:  Positive for chest pain (intermittent - Pending bypass graft in two weeks).  No chest pain today.   Vitals:   06/16/21 1036  BP: 131/76  Pulse: 77  SpO2: 99%    mMRC score= 2 CAT score= 27 See Documentation Flowsheet - CAT/COPD for complete symptom scoring.  See "scanned report" or Documentation Flowsheet (discrete results - PFTs) for Spirometry results. Patient provided good effort while attempting spirometry.   Lung Age = 37   A/P: Patient has been experiencing shortness of breath for several months and taking advair inhaler with improvement in symptoms over the past week - she has not been using PRN albuterol.  Her evaluation is complicated by the administration of her recently started Advair this morning.  Spirometry evaluation with pre- bronchodilator reveals near normal lung function with lung age equal to her current chronological age.  She is working toward complete tobacco cessation in the next  two weeks. She is motivated by her planned triple bypass surgery.  - CAT score is concerning for highly symptomatic.  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  -continue Advair  1 inhalation twice daily until after surgery.  Agreed that we could reevaluate need for this medication or other medication after her procedure and recovery.  - Reinforced encouragement that quitting smoking was the most important thing to focus on currently for her breathing and her upcoming surgery.  -Reviewed results of pulmonary function tests.  Pt verbalized understanding of results and education.    Written pt instructions provided.  F/U Clinic visit with PCP, Dr. Nita Sells I offered her support in the future in needed as well as the opportunity to repeat spirometry after her recovery from her bypass.     Total time in face to face counseling 48 minutes.  Patient seen with Berdie Ogren, PharmD Candidate.   Marland Kitchen

## 2021-06-16 NOTE — Progress Notes (Signed)
Reviewed: Agree with Dr. Koval's documentation and management. 

## 2021-06-16 NOTE — Assessment & Plan Note (Signed)
Patient has been experiencing shortness of breath for several months and taking advair inhaler with improvement in symptoms over the past week - she has not been using PRN albuterol.  Her evaluation is complicated by the administration of her recently started Advair this morning.  Spirometry evaluation with pre- bronchodilator reveals near normal lung function with lung age equal to her current chronological age.  She is working toward complete tobacco cessation in the next two weeks. She is motivated by her planned triple bypass surgery.  - CAT score is concerning for highly symptomatic.  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  -continue Advair 1 inhalation twice daily until after surgery.  Agreed that we could reevaluate need for this medication or other medication after her procedure and recovery.  - Reinforced encouragement that quitting smoking was the most important thing to focus on currently for her breathing and her upcoming surgery.  -Reviewed results of pulmonary function tests.  Pt verbalized understanding of results and education.

## 2021-06-16 NOTE — Patient Instructions (Signed)
Nice to see you today. Normal spirometry result. Continue Advair twice daily and Ventolin HFA as needed for breathlessness.

## 2021-06-19 ENCOUNTER — Ambulatory Visit (INDEPENDENT_AMBULATORY_CARE_PROVIDER_SITE_OTHER): Payer: Medicaid Other | Admitting: Thoracic Surgery (Cardiothoracic Vascular Surgery)

## 2021-06-19 VITALS — BP 104/66 | HR 82 | Resp 20 | Ht 67.5 in | Wt 207.0 lb

## 2021-06-19 DIAGNOSIS — I2510 Atherosclerotic heart disease of native coronary artery without angina pectoris: Secondary | ICD-10-CM | POA: Diagnosis not present

## 2021-06-19 NOTE — H&P (View-Only) (Signed)
[address]       [place] [address]             [phone]                                        Cassidy [name]  Medical Record #[id number] Date of Birth: 01/23/1965   Referring: Jettie Booze, MD Primary Care: [name], DO Primary Cardiologist:None   Chief Complaint:        Chief Complaint  Patient presents with   Coronary Artery Disease      Initial surgical consult, Cardiac cath and ECHO 3/30      History of Present Illness:     56 year old female with history of coronary artery disease and diabetes melitis presents for surgical evaluation of three-vessel coronary disease that was found on an elective left heart cath.  She describes 3 episodes of anginal symptoms similar to her heart attack in 2015 where she underwent PCI to the LAD.  All of these have occurred with exertion, but she also admits to having significant anxiety about this.  Since her last progress exertional dyspnea.  She denies any more anginal symptoms.   Past Medical and Surgical History: Previous Chest Surgery: Multiple breast procedures for infections. Previous Chest Radiation: None Diabetes Mellitus: Yes.  HbA1C pending Creatinine: 0.95       Past Medical History:  Diagnosis Date   Anemia     Atherosclerotic heart disease of native coronary artery with angina pectoris ([place]) 05/2012, 10/2013    a.  s/p PCTA to dRCA and ostial RPAV-PLA vessel + PDA branch (05/2012)  b. [place] s/p DES to mLAD - Resolute DES 3.0 x 22 (3.85mm -->3.3 mm)   Bipolar depression ([place])     Breast abscess      a. right side.    COPD (chronic obstructive pulmonary disease) (HCC)     Depression     Diabetic peripheral neuropathy (HCC)     Fibromyalgia     Genital warts     GERD (gastroesophageal reflux disease)     HLD (hyperlipidemia)     Hypertension     Pain in limb      a. LE VENOUS DUPLEX, 02/05/2009 - no evidence of deep vein thrombosis, Baker's cyst   PTSD  (post-traumatic stress disorder)     Sickle cell trait ([place])     Tobacco abuse     Tooth caries     Type II diabetes mellitus ([place])             Past Surgical History:  Procedure Laterality Date   BLADDER SURGERY   1980    "TVT"   BREAST EXCISIONAL BIOPSY       BREAST SURGERY Right      I&D for multiple abscesses   CARDIAC CATHETERIZATION       cutting balloon       EYE SURGERY Left      laser surgery   INCISE AND DRAIN ABCESS   ; 04/26/11; 08/13/11    right breast   INCISION AND DRAINAGE ABSCESS Right 05/09/2012    Procedure: INCISION AND DRAINAGE RIGHT BREAST ABSCESS;  Surgeon: Imogene Burn. Georgette Dover, MD;  Location: [facility];  Service: General;  Laterality: Right;   INCISION AND DRAINAGE ABSCESS Right 02/08/2014    Procedure: INCISION AND DRAINAGE RIGHT BREAST ABSCESS;  Surgeon:  Donnie Mesa, MD;  Location: [facility];  Service: General;  Laterality: Right;   INCISION AND DRAINAGE ABSCESS Right 06/14/2014    Procedure: INCISION AND DRAINAGE RIGHT BREAST ABSCESS;  Surgeon: Donnie Mesa, MD;  Location: [facility];  Service: General;  Laterality: Right;   IRRIGATION AND DEBRIDEMENT ABSCESS   12/19/2010    Procedure: IRRIGATION AND DEBRIDEMENT ABSCESS;  Surgeon: Rolm Bookbinder, MD;  Location: [facility];  Service: General;  Laterality: Right;   IRRIGATION AND DEBRIDEMENT ABSCESS   08/13/2011    Procedure: MINOR INCISION AND DRAINAGE OF ABSCESS;  Surgeon: Gwenyth Ober, MD;  Location: [facility];  Service: General;  Laterality: Right;  Right Breast     IRRIGATION AND DEBRIDEMENT ABSCESS   11/17/2011    Procedure: IRRIGATION AND DEBRIDEMENT ABSCESS;  Surgeon: Imogene Burn. Georgette Dover, MD;  Location: [facility];  Service: General;  Laterality: Right;  irrigation and debridement right recurrent breast abscess   LARYNX SURGERY       LEFT HEART CATH AND CORONARY ANGIOGRAPHY N/A 04/09/2021    Procedure: LEFT HEART CATH AND CORONARY ANGIOGRAPHY;  Surgeon: Jettie Booze, MD;  Location: [facility];  Service:  Cardiovascular;  Laterality: N/A;   LEFT HEART CATHETERIZATION WITH CORONARY ANGIOGRAM N/A 05/29/2012    Procedure: LEFT HEART CATHETERIZATION WITH CORONARY ANGIOGRAM & PTCA;  Surgeon: Troy Sine, MD;  Location: [facility] CATH [facility];  Service: Cardiovascular; Bifurcation dRCA-RPAD/PLA & rPDA  PTCA.   LEFT HEART CATHETERIZATION WITH CORONARY ANGIOGRAM N/A 11/08/2013    Procedure: LEFT HEART CATHETERIZATION WITH CORONARY ANGIOGRAM and Coronary Stent Intervention;  Surgeon: Leonie Man, MD;  Location: [facility] CATH LAB;  Service: Cardiovascular; mLAD 80% (FFR 0.73) - resolute DES 3.0 x 22 mm (postdilated to 3.3 mm->3.5 mm)   NM MYOVIEW LTD   01/26/2009    Normal study, no evidence of ischemia, EF 67%   ployp removed from voice box 03/30/12       REFRACTIVE SURGERY   ~ 2010    right   TONSILLECTOMY   1988          Social History        Tobacco Use  Smoking Status Every Day   Packs/day: 0.50   Years: 40.00   Pack years: 20.00   Types: Cigarettes  Smokeless Tobacco Never  Tobacco Comments    08/14/11 "quit for 3 wk 05/2011; was smoking 1 ppd since age 52 before I quit; at least I've cut down to 1/4 ppd"    Social History        Substance and Sexual Activity  Alcohol Use No   Alcohol/week: 0.0 standard drinks    Comment: recovering addict; 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              Current Outpatient Medications  Medication Sig Dispense Refill   ACCU-CHEK  AVIVA PLUS test strip TEST BLOOD SUGAR THREE TIMES DAILY AS DIRECTED 100 each 2  aspirin EC 81 MG tablet Take 81 mg by mouth daily.       atorvastatin (LIPITOR) 40 MG tablet TAKE 1 TABLET (40 MG TOTAL) BY MOUTH DAILY. 90 tablet 3   busPIRone (BUSPAR) 15 MG tablet TAKE 1/2 TABLET (7.5 MG TOTAL) BY MOUTH TWICE DAILY 30 tablet 11   carvedilol (COREG) 6.25 MG tablet Take 1 tablet (6.25 mg total) by mouth 2 (two) times daily with a meal. (Patient taking differently: Take 12.5 mg by mouth daily.) 60 tablet 11   diclofenac sodium (VOLTAREN) 1 % GEL Apply 1 application topically 3 (three) times daily as needed (pain).       ferrous sulfate 325 (65 FE) MG tablet Take 325 mg by mouth daily with breakfast.       fluticasone (FLONASE) 50 MCG/ACT nasal spray Place 1 spray into both nostrils daily as needed (congestion). 15.8 mL 3   ipratropium (ATROVENT HFA) 17 MCG/ACT inhaler Inhale 2 puffs into the lungs every 4 (four) hours as needed for wheezing (COPD).       isosorbide mononitrate (IMDUR) 30 MG 24 hr tablet Take 1 tablet (30 mg total) by mouth daily. 90 tablet 3   Lidocaine (HM LIDOCAINE PATCH) 4 % PTCH Apply 1 patch topically daily as needed. 12 patch 0   linaclotide (LINZESS) 290 MCG CAPS capsule Take 1 capsule (290 mcg total) by mouth daily before breakfast. 30 capsule 3   losartan (COZAAR) 50 MG tablet TAKE ONE TABLET BY MOUTH ONCE DAILY 30 tablet 1   mometasone (ELOCON) 0.1 % cream Apply 1 application topically 2 (two) times daily as needed (wound care (vaginal boils)).   2   nitroGLYCERIN (NITROSTAT) 0.4 MG SL tablet Place 1 tablet (0.4 mg total) under the tongue every 5 (five) minutes as needed for chest pain. 25 tablet 5   polyethylene glycol powder (GLYCOLAX/MIRALAX) 17 GM/SCOOP powder Take 17 g by mouth daily. Mix with 8-12 oz of water 3350 g 1   tiZANidine (ZANAFLEX) 2 MG tablet Take 1 tablet (2 mg total) by mouth every 8 (eight) hours as needed for muscle spasms. 21 tablet 0   TRULICITY 5.78  IO/9.6EX SOPN INJECT 0.75 MG INTO THE SKIN ONCE A WEEK. 2 mL 1   valACYclovir (VALTREX) 1000 MG tablet TAKE FOR 3 DAYS AS NEEDED FOR EACH OUTBREAK 30 tablet 11   vitamin B-12 (CYANOCOBALAMIN) 1000 MCG tablet Take 1,000 mcg by mouth daily.        No current facility-administered medications for this visit.      (Not in a hospital admission)          Family History  Problem Relation Age of Onset   Esophageal cancer Mother     COPD Mother     Cancer Mother          lymphoma   Heart disease Mother     Stomach cancer Mother     Hyperlipidemia Father     Heart disease Father     Hypertension Brother     Cancer Maternal Aunt          breast, colon   Crohn's disease Maternal Aunt     Hypertension Maternal Grandmother     Diabetes Maternal Grandmother     Rectal cancer Neg Hx     Liver cancer Neg Hx     Colon cancer Neg Hx          Review of Systems:    Review of Systems  Constitutional:  Positive for  malaise/fatigue.  Respiratory:  Positive for shortness of breath.   Cardiovascular:  Positive for chest pain. Negative for palpitations, orthopnea and leg swelling.  Psychiatric/Behavioral:  The patient is nervous/anxious.                  Physical Exam: Vitals:   06/19/21 1103  BP: 104/66  Pulse: 82  Resp: 20  SpO2: 96%    Physical Exam Constitutional:      General: She is not in acute distress.    Appearance: Normal appearance. She is not ill-appearing.  HENT:     Head: Normocephalic and atraumatic.  Cardiovascular:     Rate and Rhythm: Normal rate.  Pulmonary:     Effort: Pulmonary effort is normal. No respiratory distress.  Musculoskeletal:        General: Normal range of motion.     Cervical back: Normal range of motion.  Skin:    General: Skin is warm and dry.  Neurological:     General: No focal deficit present.     Mental Status: She is alert and oriented to person, place, and time.        Diagnostic Studies & Laboratory data:    Left Heart  Catherization: Intervention   Echo: IMPRESSIONS     1. Left ventricular ejection fraction, by estimation, is 60 to 65%. The  left ventricle has normal function. The left ventricle has no regional  wall motion abnormalities. There is mild left ventricular hypertrophy.  Left ventricular diastolic parameters  are consistent with Grade I diastolic dysfunction (impaired relaxation).   2. Right ventricular systolic function is normal. The right ventricular  size is normal.   3. The mitral valve is normal in structure. Trivial mitral valve  regurgitation. No evidence of mitral stenosis.   4. The aortic valve is tricuspid. Aortic valve regurgitation is not  visualized. No aortic stenosis is present.   5. The inferior vena cava is normal in size with greater than 50%  respiratory variability, suggesting right atrial pressure of 3 mmHg.  EKG: Sinus I have independently reviewed the above radiologic studies and discussed with the patient    Recent Lab Findings: Recent Labs       Lab Results  Component Value Date    WBC 11.0 (H) 04/03/2021    HGB 13.1 04/03/2021    HCT 38.8 04/03/2021    PLT 305 04/03/2021    GLUCOSE 129 (H) 04/03/2021    CHOL 126 06/11/2020    TRIG 90 06/11/2020    HDL 54 06/11/2020    LDLCALC 55 06/11/2020    ALT 14 10/19/2015    AST 12 (L) 10/19/2015    NA 140 04/03/2021    K 4.0 04/03/2021    CL 104 04/03/2021    CREATININE 0.95 04/03/2021    BUN 13 04/03/2021    CO2 25 04/03/2021    TSH 1.374 11/06/2013    INR 0.93 03/27/2015    HGBA1C 6.8 (H) 04/24/2020            Assessment / Plan:   56 year old female with severe three-vessel coronary artery disease and preserved biventricular function.  On review of her left heart cath she has good distal targets on all walls.  She likely will require 2 grafts to the lateral wall.  She is left-handed and is potentially in agreement with the use of her right radial artery.  She will require a CABG x4 with right  radial artery harvest.  I  spent 40 minutes counseling the patient face to face.

## 2021-06-19 NOTE — Progress Notes (Signed)
[address]       [place] [address]             [phone]                                        Winslow D Zazueta [place] Medical Record #[id number] Date of Birth: [birth year]   Referring: Jettie Booze, MD Primary Care: [name], DO Primary Cardiologist:None   Chief Complaint:        Chief Complaint  Patient presents with   Coronary Artery Disease      Initial surgical consult, Cardiac cath and ECHO 3/30      History of Present Illness:     56 year old female with history of coronary artery disease and diabetes melitis presents for surgical evaluation of three-vessel coronary disease that was found on an elective left heart cath.  She describes 3 episodes of anginal symptoms similar to her heart attack in 2015 where she underwent PCI to the LAD.  All of these have occurred with exertion, but she also admits to having significant anxiety about this.  Since her last progress exertional dyspnea.  She denies any more anginal symptoms.   Past Medical and Surgical History: Previous Chest Surgery: Multiple breast procedures for infections. Previous Chest Radiation: None Diabetes Mellitus: Yes.  HbA1C pending Creatinine: 0.95       Past Medical History:  Diagnosis Date   Anemia     Atherosclerotic heart disease of native coronary artery with angina pectoris ([place]) 05/2012, 10/2013    a.  s/p PCTA to dRCA and ostial RPAV-PLA vessel + PDA branch (05/2012)  b. [place] s/p DES to mLAD - Resolute DES 3.0 x 22 (3.31mm -->3.3 mm)   Bipolar depression ([place])     Breast abscess      a. right side.    COPD (chronic obstructive pulmonary disease) (HCC)     Depression     Diabetic peripheral neuropathy (HCC)     Fibromyalgia     Genital warts     GERD (gastroesophageal reflux disease)     HLD (hyperlipidemia)     Hypertension     Pain in limb      a. LE VENOUS DUPLEX, 02/05/2009 - no evidence of deep vein thrombosis, Baker's cyst   PTSD  (post-traumatic stress disorder)     Sickle cell trait ([place])     Tobacco abuse     Tooth caries     Type II diabetes mellitus ([place])             Past Surgical History:  Procedure Laterality Date   BLADDER SURGERY   1980    "TVT"   BREAST EXCISIONAL BIOPSY       BREAST SURGERY Right      I&D for multiple abscesses   CARDIAC CATHETERIZATION       cutting balloon       EYE SURGERY Left      laser surgery   INCISE AND DRAIN ABCESS   ; 04/26/11; 08/13/11    right breast   INCISION AND DRAINAGE ABSCESS Right 05/09/2012    Procedure: INCISION AND DRAINAGE RIGHT BREAST ABSCESS;  Surgeon: Imogene Burn. Georgette Dover, MD;  Location: [facility];  Service: General;  Laterality: Right;   INCISION AND DRAINAGE ABSCESS Right 02/08/2014    Procedure: INCISION AND DRAINAGE RIGHT BREAST ABSCESS;  Surgeon:  Donnie Mesa, MD;  Location: [facility];  Service: General;  Laterality: Right;   INCISION AND DRAINAGE ABSCESS Right 06/14/2014    Procedure: INCISION AND DRAINAGE RIGHT BREAST ABSCESS;  Surgeon: Donnie Mesa, MD;  Location: [facility];  Service: General;  Laterality: Right;   IRRIGATION AND DEBRIDEMENT ABSCESS   12/19/2010    Procedure: IRRIGATION AND DEBRIDEMENT ABSCESS;  Surgeon: Rolm Bookbinder, MD;  Location: [facility];  Service: General;  Laterality: Right;   IRRIGATION AND DEBRIDEMENT ABSCESS   08/13/2011    Procedure: MINOR INCISION AND DRAINAGE OF ABSCESS;  Surgeon: Gwenyth Ober, MD;  Location: [facility];  Service: General;  Laterality: Right;  Right Breast     IRRIGATION AND DEBRIDEMENT ABSCESS   11/17/2011    Procedure: IRRIGATION AND DEBRIDEMENT ABSCESS;  Surgeon: Imogene Burn. Georgette Dover, MD;  Location: [facility];  Service: General;  Laterality: Right;  irrigation and debridement right recurrent breast abscess   LARYNX SURGERY       LEFT HEART CATH AND CORONARY ANGIOGRAPHY N/A 04/09/2021    Procedure: LEFT HEART CATH AND CORONARY ANGIOGRAPHY;  Surgeon: Jettie Booze, MD;  Location: [facility];  Service:  Cardiovascular;  Laterality: N/A;   LEFT HEART CATHETERIZATION WITH CORONARY ANGIOGRAM N/A 05/29/2012    Procedure: LEFT HEART CATHETERIZATION WITH CORONARY ANGIOGRAM & PTCA;  Surgeon: Troy Sine, MD;  Location: [facility] CATH LAB;  Service: Cardiovascular; Bifurcation dRCA-RPAD/PLA & rPDA  PTCA.   LEFT HEART CATHETERIZATION WITH CORONARY ANGIOGRAM N/A 11/08/2013    Procedure: LEFT HEART CATHETERIZATION WITH CORONARY ANGIOGRAM and Coronary Stent Intervention;  Surgeon: Leonie Man, MD;  Location: [facility] CATH LAB;  Service: Cardiovascular; mLAD 80% (FFR 0.73) - resolute DES 3.0 x 22 mm (postdilated to 3.3 mm->3.5 mm)   NM MYOVIEW LTD   01/26/2009    Normal study, no evidence of ischemia, EF 67%   ployp removed from voice box 03/30/12       REFRACTIVE SURGERY   ~ 2010    right   TONSILLECTOMY   1988          Social History        Tobacco Use  Smoking Status Every Day   Packs/day: 0.50   Years: 40.00   Pack years: 20.00   Types: Cigarettes  Smokeless Tobacco Never  Tobacco Comments    08/14/11 "quit for 3 wk 05/2011; was smoking 1 ppd since age 37 before I quit; at least I've cut down to 1/4 ppd"    Social History        Substance and Sexual Activity  Alcohol Use No   Alcohol/week: 0.0 standard drinks    Comment: recovering addict; 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              Current Outpatient Medications  Medication Sig Dispense Refill   ACCU-CHEK  AVIVA PLUS test strip TEST BLOOD SUGAR THREE TIMES DAILY AS DIRECTED 100 each 2  aspirin EC 81 MG tablet Take 81 mg by mouth daily.       atorvastatin (LIPITOR) 40 MG tablet TAKE 1 TABLET (40 MG TOTAL) BY MOUTH DAILY. 90 tablet 3   busPIRone (BUSPAR) 15 MG tablet TAKE 1/2 TABLET (7.5 MG TOTAL) BY MOUTH TWICE DAILY 30 tablet 11   carvedilol (COREG) 6.25 MG tablet Take 1 tablet (6.25 mg total) by mouth 2 (two) times daily with a meal. (Patient taking differently: Take 12.5 mg by mouth daily.) 60 tablet 11   diclofenac sodium (VOLTAREN) 1 % GEL Apply 1 application topically 3 (three) times daily as needed (pain).       ferrous sulfate 325 (65 FE) MG tablet Take 325 mg by mouth daily with breakfast.       fluticasone (FLONASE) 50 MCG/ACT nasal spray Place 1 spray into both nostrils daily as needed (congestion). 15.8 mL 3   ipratropium (ATROVENT HFA) 17 MCG/ACT inhaler Inhale 2 puffs into the lungs every 4 (four) hours as needed for wheezing (COPD).       isosorbide mononitrate (IMDUR) 30 MG 24 hr tablet Take 1 tablet (30 mg total) by mouth daily. 90 tablet 3   Lidocaine (HM LIDOCAINE PATCH) 4 % PTCH Apply 1 patch topically daily as needed. 12 patch 0   linaclotide (LINZESS) 290 MCG CAPS capsule Take 1 capsule (290 mcg total) by mouth daily before breakfast. 30 capsule 3   losartan (COZAAR) 50 MG tablet TAKE ONE TABLET BY MOUTH ONCE DAILY 30 tablet 1   mometasone (ELOCON) 0.1 % cream Apply 1 application topically 2 (two) times daily as needed (wound care (vaginal boils)).   2   nitroGLYCERIN (NITROSTAT) 0.4 MG SL tablet Place 1 tablet (0.4 mg total) under the tongue every 5 (five) minutes as needed for chest pain. 25 tablet 5   polyethylene glycol powder (GLYCOLAX/MIRALAX) 17 GM/SCOOP powder Take 17 g by mouth daily. Mix with 8-12 oz of water 3350 g 1   tiZANidine (ZANAFLEX) 2 MG tablet Take 1 tablet (2 mg total) by mouth every 8 (eight) hours as needed for muscle spasms. 21 tablet 0   TRULICITY 1.96  QI/2.9NL SOPN INJECT 0.75 MG INTO THE SKIN ONCE A WEEK. 2 mL 1   valACYclovir (VALTREX) 1000 MG tablet TAKE FOR 3 DAYS AS NEEDED FOR EACH OUTBREAK 30 tablet 11   vitamin B-12 (CYANOCOBALAMIN) 1000 MCG tablet Take 1,000 mcg by mouth daily.        No current facility-administered medications for this visit.      (Not in a hospital admission)          Family History  Problem Relation Age of Onset   Esophageal cancer Mother     COPD Mother     Cancer Mother          lymphoma   Heart disease Mother     Stomach cancer Mother     Hyperlipidemia Father     Heart disease Father     Hypertension Brother     Cancer Maternal Aunt          breast, colon   Crohn's disease Maternal Aunt     Hypertension Maternal Grandmother     Diabetes Maternal Grandmother     Rectal cancer Neg Hx     Liver cancer Neg Hx     Colon cancer Neg Hx          Review of Systems:    Review of Systems  Constitutional:  Positive for  malaise/fatigue.  Respiratory:  Positive for shortness of breath.   Cardiovascular:  Positive for chest pain. Negative for palpitations, orthopnea and leg swelling.  Psychiatric/Behavioral:  The patient is nervous/anxious.                  Physical Exam: Vitals:   06/19/21 1103  BP: 104/66  Pulse: 82  Resp: 20  SpO2: 96%    Physical Exam Constitutional:      General: She is not in acute distress.    Appearance: Normal appearance. She is not ill-appearing.  HENT:     Head: Normocephalic and atraumatic.  Cardiovascular:     Rate and Rhythm: Normal rate.  Pulmonary:     Effort: Pulmonary effort is normal. No respiratory distress.  Musculoskeletal:        General: Normal range of motion.     Cervical back: Normal range of motion.  Skin:    General: Skin is warm and dry.  Neurological:     General: No focal deficit present.     Mental Status: She is alert and oriented to person, place, and time.        Diagnostic Studies & Laboratory data:    Left Heart  Catherization: Intervention   Echo: IMPRESSIONS     1. Left ventricular ejection fraction, by estimation, is 60 to 65%. The  left ventricle has normal function. The left ventricle has no regional  wall motion abnormalities. There is mild left ventricular hypertrophy.  Left ventricular diastolic parameters  are consistent with Grade I diastolic dysfunction (impaired relaxation).   2. Right ventricular systolic function is normal. The right ventricular  size is normal.   3. The mitral valve is normal in structure. Trivial mitral valve  regurgitation. No evidence of mitral stenosis.   4. The aortic valve is tricuspid. Aortic valve regurgitation is not  visualized. No aortic stenosis is present.   5. The inferior vena cava is normal in size with greater than 50%  respiratory variability, suggesting right atrial pressure of 3 mmHg.  EKG: Sinus I have independently reviewed the above radiologic studies and discussed with the patient    Recent Lab Findings: Recent Labs       Lab Results  Component Value Date    WBC 11.0 (H) 04/03/2021    HGB 13.1 04/03/2021    HCT 38.8 04/03/2021    PLT 305 04/03/2021    GLUCOSE 129 (H) 04/03/2021    CHOL 126 06/11/2020    TRIG 90 06/11/2020    HDL 54 06/11/2020    LDLCALC 55 06/11/2020    ALT 14 10/19/2015    AST 12 (L) 10/19/2015    NA 140 04/03/2021    K 4.0 04/03/2021    CL 104 04/03/2021    CREATININE 0.95 04/03/2021    BUN 13 04/03/2021    CO2 25 04/03/2021    TSH 1.374 11/06/2013    INR 0.93 03/27/2015    HGBA1C 6.8 (H) 04/24/2020            Assessment / Plan:   56 year old female with severe three-vessel coronary artery disease and preserved biventricular function.  On review of her left heart cath she has good distal targets on all walls.  She likely will require 2 grafts to the lateral wall.  She is left-handed and is potentially in agreement with the use of her right radial artery.  She will require a CABG x4 with right  radial artery harvest.  I  spent 40 minutes counseling the patient face to face.

## 2021-06-25 NOTE — Progress Notes (Addendum)
Surgical Instructions    Your procedure is scheduled on Tuesday June 20.  Report to [facility] Main Entrance "A" at 8:30 A.M., then check in with the Admitting office.  Call this number if you have problems the morning of surgery:  [phone]   If you have any questions prior to your surgery date call [phone]: Open Monday-Friday 8am-4pm    Remember:  Do not eat or drink anything after midnight the night before your surgery     Take these medicines the morning of surgery with A SIP OF WATER:  amLODipine (NORVASC) atorvastatin (LIPITOR) busPIRone (BUSPAR)  carvedilol (COREG)  fluticasone-salmeterol (ADVAIR)  isosorbide mononitrate (IMDUR) pantoprazole (PROTONIX) sertraline (ZOLOFT)  If needed you may take: Carboxymethylcellul-Glycerin (LUBRICATING EYE DROPS OP) fluticasone (FLONASE) ipratropium (ATROVENT HFA)  nitroGLYCERIN (NITROSTAT)  tiZANidine (ZANAFLEX)  As of today, STOP taking any Aspirin (unless otherwise instructed by your surgeon) Aleve, Naproxen, Ibuprofen, Motrin, Advil, Goody's, BC's, all herbal medications, fish oil, and all vitamins.   WHAT DO I DO ABOUT MY DIABETES MEDICATION?  Do not take oral diabetes medicines (pills) the morning of surgery.      The day of surgery, do not take other diabetes injectables, including Byetta (exenatide), Bydureon (exenatide ER), Victoza (liraglutide), or Trulicity (dulaglutide).    HOW TO MANAGE YOUR DIABETES BEFORE AND AFTER SURGERY  Why is it important to control my blood sugar before and after surgery? Improving blood sugar levels before and after surgery helps healing and can limit problems. A way of improving blood sugar control is eating a healthy diet by:  Eating less sugar and carbohydrates  Increasing activity/exercise  Talking with your doctor about reaching your blood sugar goals High blood sugars (greater than 180 mg/dL) can raise your risk of infections and slow your recovery, so you will need to  focus on controlling your diabetes during the weeks before surgery. Make sure that the doctor who takes care of your diabetes knows about your planned surgery including the date and location.  How do I manage my blood sugar before surgery? Check your blood sugar at least 4 times a day, starting 2 days before surgery, to make sure that the level is not too high or low.  Check your blood sugar the morning of your surgery when you wake up and every 2 hours until you get to the Short Stay unit.  If your blood sugar is less than 70 mg/dL, you will need to treat for low blood sugar: Do not take insulin. Treat a low blood sugar (less than 70 mg/dL) with  cup of clear juice (cranberry or apple), 4 glucose tablets, OR glucose gel. Recheck blood sugar in 15 minutes after treatment (to make sure it is greater than 70 mg/dL). If your blood sugar is not greater than 70 mg/dL on recheck, call [phone] for further instructions. Report your blood sugar to the short stay nurse when you get to Short Stay.  If you are admitted to the hospital after surgery: Your blood sugar will be checked by the staff and you will probably be given insulin after surgery (instead of oral diabetes medicines) to make sure you have good blood sugar levels. The goal for blood sugar control after surgery is 80-180 mg/dL.         Do not wear jewelry or makeup Do not wear lotions, powders, perfumes/colognes, or deodorant. Do not shave 48 hours prior to surgery.  Men may shave face and neck. Do not bring valuables to the hospital. Do  not wear nail polish, gel polish, artificial nails, or any other type of covering on natural nails (fingers and toes) If you have artificial nails or gel coating that need to be removed by a nail salon, please have this removed prior to surgery. Artificial nails or gel coating may interfere with anesthesia's ability to adequately monitor your vital signs.  [facility] is not responsible for any  belongings or valuables. .   Do NOT Smoke (Tobacco/Vaping)  24 hours prior to your procedure  If you use a CPAP at night, you may bring your mask for your overnight stay.   Contacts, glasses, hearing aids, dentures or partials may not be worn into surgery, please bring cases for these belongings   For patients admitted to the hospital, discharge time will be determined by your treatment team.   Patients discharged the day of surgery will not be allowed to drive home, and someone needs to stay with them for 24 hours.   SURGICAL WAITING ROOM VISITATION Patients having surgery or a procedure in a hospital may have two support people. Children under the age of 39 must have an adult with them who is not the patient. They may stay in the waiting area during the procedure and may switch out with other visitors. If the patient needs to stay at the hospital during part of their recovery, the visitor guidelines for inpatient rooms apply.  Please refer to the [facility] website for the visitor guidelines for Inpatients (after your surgery is over and you are in a regular room).       Special instructions:    Oral Hygiene is also important to reduce your risk of infection.  Remember - BRUSH YOUR TEETH THE MORNING OF SURGERY WITH YOUR REGULAR TOOTHPASTE   [place]- Preparing For Surgery  Before surgery, you can play an important role. Because skin is not sterile, your skin needs to be as free of germs as possible. You can reduce the number of germs on your skin by washing with CHG (chlorahexidine gluconate) Soap before surgery.  CHG is an antiseptic cleaner which kills germs and bonds with the skin to continue killing germs even after washing.     Please do not use if you have an allergy to CHG or antibacterial soaps. If your skin becomes reddened/irritated stop using the CHG.  Do not shave (including legs and underarms) for at least 48 hours prior to first CHG shower. It is OK to shave your  face.  Please follow these instructions carefully.     Shower the NIGHT BEFORE SURGERY and the MORNING OF SURGERY with CHG Soap.   If you chose to wash your hair, wash your hair first as usual with your normal shampoo. After you shampoo, rinse your hair and body thoroughly to remove the shampoo.  Then [employer] and genitals (private parts) with your normal soap and rinse thoroughly to remove soap.  After that Use CHG Soap as you would any other liquid soap. You can apply CHG directly to the skin and wash gently with a scrungie or a clean washcloth.   Apply the CHG Soap to your body ONLY FROM THE NECK DOWN.  Do not use on open wounds or open sores. Avoid contact with your eyes, ears, mouth and genitals (private parts). Wash Face and genitals (private parts)  with your normal soap.   Wash thoroughly, paying special attention to the area where your surgery will be performed.  Thoroughly rinse your body with warm  water from the neck down.  DO NOT shower/wash with your normal soap after using and rinsing off the CHG Soap.  Pat yourself dry with a CLEAN TOWEL.  Wear CLEAN PAJAMAS to bed the night before surgery  Place CLEAN SHEETS on your bed the night before your surgery  DO NOT SLEEP WITH PETS.   Day of Surgery:  Take a shower with CHG soap. Wear Clean/Comfortable clothing the morning of surgery Do not apply any deodorants/lotions.   Remember to brush your teeth WITH YOUR REGULAR TOOTHPASTE.    If you received a COVID test during your pre-op visit, it is requested that you wear a mask when out in public, stay away from anyone that may not be feeling well, and notify your surgeon if you develop symptoms. If you have been in contact with anyone that has tested positive in the last 10 days, please notify your surgeon.    Please read over the following fact sheets that you were given.

## 2021-06-26 ENCOUNTER — Ambulatory Visit (HOSPITAL_COMMUNITY)
Admission: RE | Admit: 2021-06-26 | Discharge: 2021-06-26 | Disposition: A | Discharge status: Home / Self Care | Payer: Medicaid Other | Source: Ambulatory Visit | Attending: Thoracic Surgery (Cardiothoracic Vascular Surgery) | Admitting: Thoracic Surgery (Cardiothoracic Vascular Surgery)

## 2021-06-26 ENCOUNTER — Encounter (HOSPITAL_COMMUNITY): Payer: Self-pay

## 2021-06-26 ENCOUNTER — Other Ambulatory Visit: Payer: Self-pay

## 2021-06-26 ENCOUNTER — Encounter (HOSPITAL_COMMUNITY)
Admission: RE | Admit: 2021-06-26 | Discharge: 2021-06-26 | Disposition: A | Discharge status: Home / Self Care | Payer: Medicaid Other | Source: Ambulatory Visit | Attending: Thoracic Surgery (Cardiothoracic Vascular Surgery) | Admitting: Thoracic Surgery (Cardiothoracic Vascular Surgery)

## 2021-06-26 VITALS — BP 131/83 | HR 73 | Temp 98.2°F | Resp 18 | Ht 67.0 in | Wt 210.6 lb

## 2021-06-26 DIAGNOSIS — I2510 Atherosclerotic heart disease of native coronary artery without angina pectoris: Secondary | ICD-10-CM

## 2021-06-26 DIAGNOSIS — M797 Fibromyalgia: Secondary | ICD-10-CM | POA: Diagnosis not present

## 2021-06-26 DIAGNOSIS — Z20822 Contact with and (suspected) exposure to covid-19: Secondary | ICD-10-CM | POA: Diagnosis not present

## 2021-06-26 DIAGNOSIS — E669 Obesity, unspecified: Secondary | ICD-10-CM | POA: Insufficient documentation

## 2021-06-26 DIAGNOSIS — Z794 Long term (current) use of insulin: Secondary | ICD-10-CM | POA: Diagnosis not present

## 2021-06-26 DIAGNOSIS — F4310 Post-traumatic stress disorder, unspecified: Secondary | ICD-10-CM | POA: Diagnosis not present

## 2021-06-26 DIAGNOSIS — Z6832 Body mass index (BMI) 32.0-32.9, adult: Secondary | ICD-10-CM | POA: Diagnosis not present

## 2021-06-26 DIAGNOSIS — Z01818 Encounter for other preprocedural examination: Secondary | ICD-10-CM

## 2021-06-26 DIAGNOSIS — E785 Hyperlipidemia, unspecified: Secondary | ICD-10-CM | POA: Diagnosis not present

## 2021-06-26 DIAGNOSIS — F17200 Nicotine dependence, unspecified, uncomplicated: Secondary | ICD-10-CM | POA: Insufficient documentation

## 2021-06-26 DIAGNOSIS — Z8774 Personal history of (corrected) congenital malformations of heart and circulatory system: Secondary | ICD-10-CM | POA: Diagnosis not present

## 2021-06-26 DIAGNOSIS — D573 Sickle-cell trait: Secondary | ICD-10-CM | POA: Insufficient documentation

## 2021-06-26 DIAGNOSIS — E1142 Type 2 diabetes mellitus with diabetic polyneuropathy: Secondary | ICD-10-CM | POA: Diagnosis not present

## 2021-06-26 DIAGNOSIS — R911 Solitary pulmonary nodule: Secondary | ICD-10-CM | POA: Diagnosis not present

## 2021-06-26 DIAGNOSIS — K219 Gastro-esophageal reflux disease without esophagitis: Secondary | ICD-10-CM | POA: Insufficient documentation

## 2021-06-26 DIAGNOSIS — J449 Chronic obstructive pulmonary disease, unspecified: Secondary | ICD-10-CM | POA: Diagnosis not present

## 2021-06-26 DIAGNOSIS — Z951 Presence of aortocoronary bypass graft: Secondary | ICD-10-CM | POA: Insufficient documentation

## 2021-06-26 DIAGNOSIS — K449 Diaphragmatic hernia without obstruction or gangrene: Secondary | ICD-10-CM | POA: Insufficient documentation

## 2021-06-26 DIAGNOSIS — I10 Essential (primary) hypertension: Secondary | ICD-10-CM | POA: Diagnosis not present

## 2021-06-26 HISTORY — DX: Personal history of other diseases of the digestive system: Z87.19

## 2021-06-26 LAB — COMPREHENSIVE METABOLIC PANEL
ALT: 15 U/L (ref 0–44)
AST: 14 U/L — ABNORMAL LOW (ref 15–41)
Albumin: 4.1 g/dL (ref 3.5–5.0)
Alkaline Phosphatase: 82 U/L (ref 38–126)
Anion gap: 11 (ref 5–15)
BUN: 10 mg/dL (ref 6–20)
CO2: 21 mmol/L — ABNORMAL LOW (ref 22–32)
Calcium: 9.1 mg/dL (ref 8.9–10.3)
Chloride: 108 mmol/L (ref 98–111)
Creatinine, Ser: 0.71 mg/dL (ref 0.44–1.00)
GFR, Estimated: >60 mL/min (ref >60)
Glucose, Bld: 96 mg/dL (ref 70–99)
Potassium: 4.2 mmol/L (ref 3.5–5.1)
Sodium: 140 mmol/L (ref 135–145)
Total Bilirubin: 0.6 mg/dL (ref 0.3–1.2)
Total Protein: 7.3 g/dL (ref 6.5–8.1)

## 2021-06-26 LAB — BLOOD GAS, ARTERIAL
Acid-Base Excess: 0.5 mmol/L (ref 0.0–2.0)
Bicarbonate: 24.6 mmol/L (ref 20.0–28.0)
Drawn by: 58793
O2 Saturation: 98.4 %
Patient temperature: 37
pCO2 arterial: 37 mmHg (ref 32–48)
pH, Arterial: 7.43 (ref 7.35–7.45)
pO2, Arterial: 107 mmHg (ref 83–108)

## 2021-06-26 LAB — TYPE AND SCREEN
ABO/RH(D): O POS
Antibody Screen: NEGATIVE

## 2021-06-26 LAB — HEMOGLOBIN A1C
Hgb A1c MFr Bld: 6.8 % — ABNORMAL HIGH (ref 4.8–5.6)
Mean Plasma Glucose: 148.46 mg/dL

## 2021-06-26 LAB — CBC
HCT: 39 % (ref 36.0–46.0)
Hemoglobin: 13.4 g/dL (ref 12.0–15.0)
MCH: 31.2 pg (ref 26.0–34.0)
MCHC: 34.4 g/dL (ref 30.0–36.0)
MCV: 90.7 fL (ref 80.0–100.0)
Platelets: 312 10*3/uL (ref 150–400)
RBC: 4.3 MIL/uL (ref 3.87–5.11)
RDW: 13.1 % (ref 11.5–15.5)
WBC: 11.1 10*3/uL — ABNORMAL HIGH (ref 4.0–10.5)
nRBC: 0 % (ref 0.0–0.2)

## 2021-06-26 LAB — URINALYSIS, ROUTINE W REFLEX MICROSCOPIC
Bilirubin Urine: NEGATIVE
Glucose, UA: NEGATIVE mg/dL
Hgb urine dipstick: NEGATIVE
Ketones, ur: NEGATIVE mg/dL
Leukocytes,Ua: NEGATIVE
Nitrite: NEGATIVE
Protein, ur: NEGATIVE mg/dL
Specific Gravity, Urine: 1.011 (ref 1.005–1.030)
pH: 6 (ref 5.0–8.0)

## 2021-06-26 LAB — PROTIME-INR
INR: 0.9 (ref 0.8–1.2)
Prothrombin Time: 12.4 seconds (ref 11.4–15.2)

## 2021-06-26 LAB — GLUCOSE, CAPILLARY: Glucose-Capillary: 132 mg/dL — ABNORMAL HIGH (ref 70–99)

## 2021-06-26 LAB — APTT: aPTT: 31 seconds (ref 24–36)

## 2021-06-26 LAB — SURGICAL PCR SCREEN

## 2021-06-26 LAB — SARS CORONAVIRUS 2 (TAT 6-24 HRS): SARS Coronavirus 2: NEGATIVE

## 2021-06-26 NOTE — Progress Notes (Addendum)
PCP - Tretha Sciara Cardiologist - Thamas Jaegers MD  PPM/ICD - denies Device Orders -  Rep Notified -   Chest x-ray - 06/26/21 EKG - 06/26/21 Stress Test - 10/15/20 ECHO - 04/09/21 Cardiac Cath - 04/09/21  Sleep Study - no CPAP -   Fasting Blood Sugar - 120-130's Checks Blood Sugar every morning  Blood Thinner Instructions: Aspirin Instructions:continue taking. Hold day of surgery  ERAS Protcol -no PRE-SURGERY Ensure or G2-   COVID TEST- 06/26/21   Anesthesia review: yes-cardiac history  Patient denies shortness of breath, fever, cough and chest pain at PAT appointment   All instructions explained to the patient, with a verbal understanding of the material. Patient agrees to go over the instructions while at home for a better understanding. Patient also instructed to wear a mask when out in public prior to surgery after being tested for COVID-19. The opportunity to ask questions was provided.

## 2021-06-26 NOTE — Progress Notes (Signed)
Pre-CABG Dopplers completed. Refer to "CV Proc" under chart review to view preliminary results.  06/26/2021 10:40 AM Kelby Aline., MHA, RVT, RDCS, RDMS

## 2021-06-29 ENCOUNTER — Other Ambulatory Visit: Payer: Self-pay | Admitting: Family Medicine

## 2021-06-29 MED ORDER — PLASMA-LYTE A IV SOLN
INTRAVENOUS | Status: DC
  Filled 2021-06-29: qty 2.5

## 2021-06-29 MED ORDER — MANNITOL 20 % IV SOLN
INTRAVENOUS | Status: DC
  Filled 2021-06-29: qty 13

## 2021-06-29 MED ORDER — NOREPINEPHRINE 4 MG/250ML-% IV SOLN
0.0000 ug/min | INTRAVENOUS | Status: DC
  Filled 2021-06-29: qty 250

## 2021-06-29 MED ORDER — EPINEPHRINE HCL 5 MG/250ML IV SOLN IN NS
0.0000 ug/min | INTRAVENOUS | Status: DC
  Filled 2021-06-29: qty 250

## 2021-06-29 MED ORDER — TRANEXAMIC ACID (OHS) PUMP PRIME SOLUTION
2.0000 mg/kg | INTRAVENOUS | Status: DC
  Filled 2021-06-29: qty 1.91

## 2021-06-29 MED ORDER — VANCOMYCIN HCL 1500 MG/300ML IV SOLN
1500.0000 mg | INTRAVENOUS | Status: AC
  Administered 2021-06-30: 1500 mg via INTRAVENOUS
  Filled 2021-06-29: qty 300

## 2021-06-29 MED ORDER — MILRINONE LACTATE IN DEXTROSE 20-5 MG/100ML-% IV SOLN
0.3000 ug/kg/min | INTRAVENOUS | Status: DC
Start: 2021-06-30 — End: 2021-06-30
  Filled 2021-06-29: qty 100

## 2021-06-29 MED ORDER — NITROGLYCERIN IN D5W 200-5 MCG/ML-% IV SOLN
2.0000 ug/min | INTRAVENOUS | Status: AC
  Administered 2021-06-30: 5 ug/min via INTRAVENOUS
  Filled 2021-06-29: qty 250

## 2021-06-29 MED ORDER — CEFAZOLIN SODIUM-DEXTROSE 2-4 GM/100ML-% IV SOLN
2.0000 g | INTRAVENOUS | Status: AC
  Administered 2021-06-30: 2 g via INTRAVENOUS
  Filled 2021-06-29: qty 100

## 2021-06-29 MED ORDER — TRANEXAMIC ACID (OHS) BOLUS VIA INFUSION
15.0000 mg/kg | INTRAVENOUS | Status: AC
  Administered 2021-06-30: 1432.5 mg via INTRAVENOUS
  Filled 2021-06-29: qty 1433

## 2021-06-29 MED ORDER — TRANEXAMIC ACID 1000 MG/10ML IV SOLN
1.5000 mg/kg/h | INTRAVENOUS | Status: AC
  Administered 2021-06-30: 1.5 mg/kg/h via INTRAVENOUS
  Filled 2021-06-29: qty 25

## 2021-06-29 MED ORDER — POTASSIUM CHLORIDE 2 MEQ/ML IV SOLN
80.0000 meq | INTRAVENOUS | Status: DC
  Filled 2021-06-29: qty 40

## 2021-06-29 MED ORDER — PHENYLEPHRINE HCL-NACL 20-0.9 MG/250ML-% IV SOLN
30.0000 ug/min | INTRAVENOUS | Status: DC
  Filled 2021-06-29: qty 250

## 2021-06-29 MED ORDER — MAGNESIUM SULFATE 50 % IJ SOLN
40.0000 meq | INTRAMUSCULAR | Status: DC
  Filled 2021-06-29: qty 9.85

## 2021-06-29 MED ORDER — HEPARIN 30,000 UNITS/1000 ML (OHS) CELLSAVER SOLUTION
Status: DC
  Filled 2021-06-29: qty 1000

## 2021-06-29 MED ORDER — INSULIN REGULAR(HUMAN) IN NACL 100-0.9 UT/100ML-% IV SOLN
INTRAVENOUS | Status: AC
  Administered 2021-06-30: 4.2 [IU]/h via INTRAVENOUS
  Filled 2021-06-29: qty 100

## 2021-06-29 MED ORDER — DEXMEDETOMIDINE HCL IN NACL 400 MCG/100ML IV SOLN
0.1000 ug/kg/h | INTRAVENOUS | Status: AC
  Administered 2021-06-30: .4 ug/kg/h via INTRAVENOUS
  Filled 2021-06-29: qty 100

## 2021-06-29 NOTE — Progress Notes (Signed)
Anesthesia Chart Review:  Case: [id number] Date/Time: 06/30/21 1016   Procedures:      CORONARY ARTERY BYPASS GRAFTING (CABG) (Chest)     RADIAL ARTERY HARVEST (Right: Arm Lower)     TRANSESOPHAGEAL ECHOCARDIOGRAM (TEE)   Anesthesia type: General   Pre-op diagnosis: CAD   Location: [facility] OR ROOM 62 / [place] OR   Surgeons: Lajuana Matte, MD       DISCUSSION: Patient is a 56 year old female scheduled for the above procedure.  History includes smoking, HTN, DM2 (with neuropathy), CAD (cutting balloon angioplasty dRCA/oRLA/PDA 05/29/12; DES mLAD), COPD, HLD, GERD, hiatal hernia, sickle cell trait, anemia, fibromyalgia, PTSD, dental caries, right breast abscess (recurrent, s/p multiple surgeries, last 06/14/14). BMi is consistent with obesity.  06/26/21 preoperative CXR showed RUL nodule. Message was sent to Dr. Kipp Brood about this and communicated to [place]. Additional recommendations and timing of follow-up imaging per Dr. Kipp Brood.  She needs a repeat surgical PCR on arrival due to an invalid result. Anesthesia team to evaluate on the day of surgery.   VS: BP 131/83   Pulse 73   Temp 36.8 C (Oral)   Resp 18   Ht 5\' 7"  (1.702 m)   Wt 95.5 kg   LMP 06/12/2016 (Approximate)   SpO2 100%   BMI 32.98 kg/m    PROVIDERS: [name], DO is PCP  Eleonore Chiquito, MD MD is cardiologist   LABS: Labs reviewed: Acceptable for surgery. (all labs ordered are listed, but only abnormal results are displayed)  Labs Reviewed  SURGICAL PCR SCREEN - Abnormal; Notable for the following components:      Result Value   MRSA, PCR   (*)    Value: INVALID, UNABLE TO DETERMINE THE PRESENCE OF TARGET DUE TO SPECIMEN INTEGRITY. RECOLLECTION REQUESTED.   Staphylococcus aureus   (*)    Value: INVALID, UNABLE TO DETERMINE THE PRESENCE OF TARGET DUE TO SPECIMEN INTEGRITY. RECOLLECTION REQUESTED.   All other components within normal limits  CBC - Abnormal; Notable for the following components:    WBC 11.1 (*)    All other components within normal limits  COMPREHENSIVE METABOLIC PANEL - Abnormal; Notable for the following components:   CO2 21 (*)    AST 14 (*)    All other components within normal limits  HEMOGLOBIN A1C - Abnormal; Notable for the following components:   Hgb A1c MFr Bld 6.8 (*)    All other components within normal limits  SARS CORONAVIRUS 2 (TAT 6-24 HRS)  PROTIME-INR  APTT  URINALYSIS, ROUTINE W REFLEX MICROSCOPIC  BLOOD GAS, ARTERIAL  TYPE AND SCREEN     IMAGES: CXR 06/26/21: FINDINGS: - Cardiomediastinal silhouette unchanged in size and contour. No evidence of central vascular congestion. No interlobular septal thickening. - Nodule of the right upper lobe, not present on the comparison. - No pneumothorax or pleural effusion. Coarsened interstitial markings, with no confluent airspace disease. - No acute displaced fracture. Degenerative changes of the spine. IMPRESSION: - Right upper lobe nodule not present on the comparison. Further evaluation with chest CT indicated. - No acute cardiopulmonary disease. - See DISCUSSION.    EKG: 06/26/21: Normal sinus rhythm Septal infarct , age undetermined Abnormal ECG No significant change since last tracing Confirmed by Skeet Latch [phone]) on 06/29/2021 7:08:25 AM   CV: US Carotid 06/26/21: Summary:  - Right Carotid: Velocities in the right ICA are consistent with a 1-39%  stenosis.  1.1 x 1.0cm heterogenous area of the proximal right neck;                 etiology is unknown.  - Left Carotid: Velocities in the left ICA are consistent with a 1-39%  stenosis.  - Vertebrals:  Left vertebral artery demonstrates antegrade flow. Right  vertebral               artery was not visualized.  - Subclavians: Normal flow hemodynamics were seen in bilateral subclavian               arteries.    Echo 04/09/21: IMPRESSIONS   1. Left ventricular ejection fraction, by estimation, is 60 to  65%. The  left ventricle has normal function. The left ventricle has no regional  wall motion abnormalities. There is mild left ventricular hypertrophy.  Left ventricular diastolic parameters  are consistent with Grade I diastolic dysfunction (impaired relaxation).   2. Right ventricular systolic function is normal. The right ventricular  size is normal.   3. The mitral valve is normal in structure. Trivial mitral valve  regurgitation. No evidence of mitral stenosis.   4. The aortic valve is tricuspid. Aortic valve regurgitation is not  visualized. No aortic stenosis is present.   5. The inferior vena cava is normal in size with greater than 50%  respiratory variability, suggesting right atrial pressure of 3 mmHg.    Cardiac cath 04/09/21:   Mid RCA to Dist RCA lesion is 80% stenosed.   Prox RCA to Mid RCA lesion is 50% stenosed.   3rd Mrg lesion is 80% stenosed.   Prox Cx to Mid Cx lesion is 75% stenosed.   Prox Cx lesion is 75% stenosed.   Ost LAD to Prox LAD lesion is 40% stenosed.   Mid LAD-2 lesion is 80% stenosed.   2nd Mrg lesion is 75% stenosed.   Previously placed Mid LAD-1 stent (unknown type) is  widely patent.   The left ventricular systolic function is normal.   LV end diastolic pressure is normal.   The left ventricular ejection fraction is 55-65% by visual estimate.   There is no aortic valve stenosis.   Severe three-vessel coronary artery disease.  Long area of disease in the circumflex and large obtuse marginal.  Diffusely diseased mid right coronary artery with focal area of significant stenosis.  Focal stenosis in the mid LAD at the distal edge of the previously placed stent.   Plan for outpatient cardiac surgery evaluation.  She needs aggressive diabetes control and smoking cessation.   Past Medical History:  Diagnosis Date   Anemia    has sickle cell trait   Atherosclerotic heart disease of native coronary artery with angina pectoris ([place]) 05/2012, 10/2013    a.  s/p PCTA to dRCA and ostial RPAV-PLA vessel + PDA branch (05/2012)  b. [place] s/p DES to mLAD - Resolute DES 3.0 x 22 (3.41mm -->3.3 mm)   Bipolar depression ([place])    Breast abscess    a. right side.    COPD (chronic obstructive pulmonary disease) (HCC)    Depression    Diabetic peripheral neuropathy (HCC)    Fibromyalgia    Genital warts    GERD (gastroesophageal reflux disease)    History of hiatal hernia    HLD (hyperlipidemia)    Hypertension    Pain in limb    a. LE VENOUS DUPLEX, 02/05/2009 - no evidence of deep vein thrombosis, Baker's cyst   PTSD (post-traumatic stress  disorder)    Sickle cell trait ([place])    Tobacco abuse    Tooth caries    Type II diabetes mellitus ([place])     Past Surgical History:  Procedure Laterality Date   BLADDER SURGERY  1980   "TVT"   BLADDER SURGERY  2003   "TVT"   BREAST EXCISIONAL BIOPSY     BREAST SURGERY Right    I&D for multiple abscesses   CARDIAC CATHETERIZATION     cutting balloon     EYE SURGERY Left    laser surgery   INCISE AND DRAIN ABCESS  ; 04/26/11; 08/13/11   right breast   INCISION AND DRAINAGE ABSCESS Right 05/09/2012   Procedure: INCISION AND DRAINAGE RIGHT BREAST ABSCESS;  Surgeon: Imogene Burn. Georgette Dover, MD;  Location: [facility];  Service: General;  Laterality: Right;   INCISION AND DRAINAGE ABSCESS Right 02/08/2014   Procedure: INCISION AND DRAINAGE RIGHT BREAST ABSCESS;  Surgeon: Donnie Mesa, MD;  Location: [facility];  Service: General;  Laterality: Right;   INCISION AND DRAINAGE ABSCESS Right 06/14/2014   Procedure: INCISION AND DRAINAGE RIGHT BREAST ABSCESS;  Surgeon: Donnie Mesa, MD;  Location: [facility];  Service: General;  Laterality: Right;   IRRIGATION AND DEBRIDEMENT ABSCESS  12/19/2010   Procedure: IRRIGATION AND DEBRIDEMENT ABSCESS;  Surgeon: Rolm Bookbinder, MD;  Location: [facility];  Service: General;  Laterality: Right;   IRRIGATION AND DEBRIDEMENT ABSCESS  08/13/2011   Procedure: MINOR INCISION AND DRAINAGE OF ABSCESS;   Surgeon: Gwenyth Ober, MD;  Location: [facility];  Service: General;  Laterality: Right;  Right Breast    IRRIGATION AND DEBRIDEMENT ABSCESS  11/17/2011   Procedure: IRRIGATION AND DEBRIDEMENT ABSCESS;  Surgeon: Imogene Burn. Georgette Dover, MD;  Location: [facility];  Service: General;  Laterality: Right;  irrigation and debridement right recurrent breast abscess   LARYNX SURGERY     LEFT HEART CATH AND CORONARY ANGIOGRAPHY N/A 04/09/2021   Procedure: LEFT HEART CATH AND CORONARY ANGIOGRAPHY;  Surgeon: Jettie Booze, MD;  Location: [facility] CV LAB;  Service: Cardiovascular;  Laterality: N/A;   LEFT HEART CATHETERIZATION WITH CORONARY ANGIOGRAM N/A 05/29/2012   Procedure: LEFT HEART CATHETERIZATION WITH CORONARY ANGIOGRAM & PTCA;  Surgeon: Troy Sine, MD;  Location: [facility] CATH LAB;  Service: Cardiovascular; Bifurcation dRCA-RPAD/PLA & rPDA  PTCA.   LEFT HEART CATHETERIZATION WITH CORONARY ANGIOGRAM N/A 11/08/2013   Procedure: LEFT HEART CATHETERIZATION WITH CORONARY ANGIOGRAM and Coronary Stent Intervention;  Surgeon: Leonie Man, MD;  Location: [facility] CATH LAB;  Service: Cardiovascular; mLAD 80% (FFR 0.73) - resolute DES 3.0 x 22 mm (postdilated to 3.3 mm->3.5 mm)   NM MYOVIEW LTD  01/26/2009   Normal study, no evidence of ischemia, EF 67%   ployp removed from voice box 03/30/12     REFRACTIVE SURGERY  ~ 2010   right   TONSILLECTOMY  1988    MEDICATIONS:  ACCU-CHEK AVIVA PLUS test strip   amLODipine (NORVASC) 5 MG tablet   aspirin EC 81 MG tablet   Aspirin-Caffeine 500-32.5 MG TABS   atorvastatin (LIPITOR) 40 MG tablet   busPIRone (BUSPAR) 15 MG tablet   Carboxymethylcellul-Glycerin (LUBRICATING EYE DROPS OP)   carvedilol (COREG) 12.5 MG tablet   diclofenac sodium (VOLTAREN) 1 % GEL   diphenhydramine-acetaminophen (TYLENOL PM) 25-500 MG TABS tablet   ferrous sulfate 325 (65 FE) MG tablet   fluticasone (FLONASE) 50 MCG/ACT nasal spray   fluticasone-salmeterol (ADVAIR) 100-50 MCG/ACT AEPB    ipratropium (ATROVENT HFA) 17 MCG/ACT  inhaler   isosorbide mononitrate (IMDUR) 30 MG 24 hr tablet   Lidocaine (HM LIDOCAINE PATCH) 4 % PTCH   LINZESS 290 MCG CAPS capsule   losartan (COZAAR) 50 MG tablet   nicotine polacrilex (NICORETTE) 2 MG gum   nitroGLYCERIN (NITROSTAT) 0.4 MG SL tablet   pantoprazole (PROTONIX) 40 MG tablet   polyethylene glycol powder (GLYCOLAX/MIRALAX) 17 GM/SCOOP powder   sertraline (ZOLOFT) 50 MG tablet   tiZANidine (ZANAFLEX) 2 MG tablet   TRULICITY 5.57 DU/2.0UR SOPN   valACYclovir (VALTREX) 1000 MG tablet   vitamin B-12 (CYANOCOBALAMIN) 1000 MCG tablet   No current facility-administered medications for this encounter.    [START ON 06/30/2021] ceFAZolin (ANCEF) IVPB 2g/100 mL premix   [START ON 06/30/2021] ceFAZolin (ANCEF) IVPB 2g/100 mL premix   [START ON 06/30/2021] dexmedetomidine (PRECEDEX) 400 MCG/100ML (4 mcg/mL) infusion   [START ON 06/30/2021] EPINEPHrine (ADRENALIN) 5 mg in NS 250 mL (0.02 mg/mL) premix infusion   [START ON 06/30/2021] heparin 30,000 units/NS 1000 mL solution for CELLSAVER   [START ON 06/30/2021] heparin sodium (porcine) 2,500 Units, papaverine 30 mg in electrolyte-A (PLASMALYTE-A PH 7.4) 500 mL irrigation   [START ON 06/30/2021] insulin regular, human (MYXREDLIN) 100 units/ 100 mL infusion   [START ON 06/30/2021] [place] Blood Cardioplegia vial (lidocaine/magnesium/mannitol 0.26g-4g-6.4g)   [START ON 06/30/2021] milrinone (PRIMACOR) 20 MG/100 ML (0.2 mg/mL) infusion   [START ON 06/30/2021] nitroGLYCERIN 50 mg in dextrose 5 % 250 mL (0.2 mg/mL) infusion   [START ON 06/30/2021] norepinephrine (LEVOPHED) 4mg  in 235mL (0.016 mg/mL) premix infusion   [START ON 06/30/2021] phenylephrine (NEO-SYNEPHRINE) 20mg /NS 212mL premix infusion   [START ON 06/30/2021] potassium chloride injection 80 mEq   [START ON 06/30/2021] tranexamic acid (CYKLOKAPRON) 2,500 mg in sodium chloride 0.9 % 250 mL (10 mg/mL) infusion   [START ON 06/30/2021] tranexamic acid  (CYKLOKAPRON) bolus via infusion - over 30 minutes 1,432.5 mg   [START ON 06/30/2021] tranexamic acid (CYKLOKAPRON) pump prime solution 191 mg   [START ON 06/30/2021] vancomycin (VANCOREADY) IVPB 1500 mg/300 mL    Myra Gianotti, PA-C Surgical Short Stay/Anesthesiology [facility] Phone [phone] [facility] Phone [phone] 06/29/2021 10:54 AM

## 2021-06-29 NOTE — Progress Notes (Signed)
Patient will need a repeat surgical PCR DOS due to an invalid result.  Per protocol, patient will receive Profend on DOS.

## 2021-06-29 NOTE — Anesthesia Preprocedure Evaluation (Signed)
Anesthesia Evaluation  Patient identified by MRN, date of birth, ID band Patient awake    Reviewed: Allergy & Precautions, NPO status , Patient's Chart, lab work & pertinent test results  Airway Mallampati: I  TM Distance: >3 FB Neck ROM: Full    Dental  (+) Teeth Intact, Dental Advisory Given   Pulmonary shortness of breath, COPD, Current Smoker and Patient abstained from smoking.,    breath sounds clear to auscultation       Cardiovascular hypertension, Pt. on medications and Pt. on home beta blockers + angina + CAD   Rhythm:Regular  1. Left ventricular ejection fraction, by estimation, is 60 to 65%. The  left ventricle has normal function. The left ventricle has no regional  wall motion abnormalities. There is mild left ventricular hypertrophy.  Left ventricular diastolic parameters  are consistent with Grade I diastolic dysfunction (impaired relaxation).  2. Right ventricular systolic function is normal. The right ventricular  size is normal.  3. The mitral valve is normal in structure. Trivial mitral valve  regurgitation. No evidence of mitral stenosis.  4. The aortic valve is tricuspid. Aortic valve regurgitation is not  visualized. No aortic stenosis is present.  5. The inferior vena cava is normal in size with greater than 50%  respiratory variability, suggesting right atrial pressure of 3 mmHg.   .  Mid RCA to Dist RCA lesion is 80% stenosed. .  Prox RCA to Mid RCA lesion is 50% stenosed. .  3rd Mrg lesion is 80% stenosed. .  Prox Cx to Mid Cx lesion is 75% stenosed. .  Prox Cx lesion is 75% stenosed. Colon Flattery LAD to Prox LAD lesion is 40% stenosed. .  Mid LAD-2 lesion is 80% stenosed. .  2nd Mrg lesion is 75% stenosed. .  Previously placed Mid LAD-1 stent (unknown type) is  widely patent. .  The left ventricular systolic function is normal. .  LV end diastolic pressure is normal. .  The left ventricular ejection  fraction is 55-65% by visual estimate. .  There is no aortic valve stenosis.  Severe three-vessel coronary artery disease.  Long area of disease in the circumflex and large obtuse marginal.  Diffusely diseased mid right coronary artery with focal area of significant stenosis.  Focal stenosis in the mid LAD at the distal edge of the previously placed stent.  Plan for outpatient cardiac surgery evaluation.  She needs aggressive diabetes control and smoking cessation.    Neuro/Psych PSYCHIATRIC DISORDERS Anxiety Depression Bipolar Disorder  Neuromuscular disease    GI/Hepatic Neg liver ROS, hiatal hernia, GERD  ,  Endo/Other  diabetes  Renal/GU negative Renal ROSLab Results      Component                Value               Date                      CREATININE               0.71                06/26/2021                Musculoskeletal  (+) Fibromyalgia -  Abdominal   Peds  Hematology Lab Results      Component                Value  Date                      WBC                      11.1 (H)            06/26/2021                HGB                      13.4                06/26/2021                HCT                      39.0                06/26/2021                MCV                      90.7                06/26/2021                PLT                      312                 06/26/2021              Anesthesia Other Findings   Reproductive/Obstetrics                            Anesthesia Physical Anesthesia Plan  ASA: 4  Anesthesia Plan: General   Post-op Pain Management:    Induction: Intravenous  PONV Risk Score and Plan: 2 and Ondansetron  Airway Management Planned: Oral ETT  Additional Equipment: Arterial line, CVP, TEE and Ultrasound Guidance Line Placement  Intra-op Plan:   Post-operative Plan: Extubation in OR  Informed Consent: I have reviewed the patients History and Physical, chart, labs and discussed the  procedure including the risks, benefits and alternatives for the proposed anesthesia with the patient or authorized representative who has indicated his/her understanding and acceptance.     Dental advisory given  Plan Discussed with: CRNA  Anesthesia Plan Comments: (PAT note written 06/29/2021 by Myra Gianotti, PA-C. )       Anesthesia Quick Evaluation

## 2021-06-30 ENCOUNTER — Inpatient Hospital Stay (HOSPITAL_COMMUNITY)
Admission: RE | Disposition: A | Discharge status: Home / Self Care | Payer: Self-pay | Source: Home / Self Care | Attending: Thoracic Surgery (Cardiothoracic Vascular Surgery)

## 2021-06-30 ENCOUNTER — Other Ambulatory Visit: Payer: Self-pay

## 2021-06-30 ENCOUNTER — Inpatient Hospital Stay (HOSPITAL_COMMUNITY): Payer: Medicaid Other | Admitting: Anesthesiology

## 2021-06-30 ENCOUNTER — Inpatient Hospital Stay (HOSPITAL_COMMUNITY)
Admission: RE | Admit: 2021-06-30 | Discharge: 2021-07-05 | DRG: 236 | Disposition: A | Discharge status: Home / Self Care | Payer: Medicaid Other | Attending: Thoracic Surgery (Cardiothoracic Vascular Surgery) | Admitting: Thoracic Surgery (Cardiothoracic Vascular Surgery)

## 2021-06-30 ENCOUNTER — Other Ambulatory Visit (HOSPITAL_COMMUNITY): Payer: Self-pay | Admitting: *Deleted

## 2021-06-30 ENCOUNTER — Encounter (HOSPITAL_COMMUNITY): Payer: Self-pay | Admitting: Thoracic Surgery (Cardiothoracic Vascular Surgery)

## 2021-06-30 ENCOUNTER — Inpatient Hospital Stay (HOSPITAL_COMMUNITY): Payer: Medicaid Other | Admitting: Vascular Surgery

## 2021-06-30 ENCOUNTER — Inpatient Hospital Stay (HOSPITAL_COMMUNITY): Payer: Medicaid Other

## 2021-06-30 DIAGNOSIS — Z951 Presence of aortocoronary bypass graft: Secondary | ICD-10-CM | POA: Diagnosis not present

## 2021-06-30 DIAGNOSIS — D573 Sickle-cell trait: Secondary | ICD-10-CM | POA: Diagnosis present

## 2021-06-30 DIAGNOSIS — J9589 Other postprocedural complications and disorders of respiratory system, not elsewhere classified: Secondary | ICD-10-CM | POA: Diagnosis not present

## 2021-06-30 DIAGNOSIS — M797 Fibromyalgia: Secondary | ICD-10-CM | POA: Diagnosis present

## 2021-06-30 DIAGNOSIS — I10 Essential (primary) hypertension: Secondary | ICD-10-CM | POA: Diagnosis not present

## 2021-06-30 DIAGNOSIS — Z7951 Long term (current) use of inhaled steroids: Secondary | ICD-10-CM | POA: Diagnosis not present

## 2021-06-30 DIAGNOSIS — Z01818 Encounter for other preprocedural examination: Principal | ICD-10-CM

## 2021-06-30 DIAGNOSIS — E1142 Type 2 diabetes mellitus with diabetic polyneuropathy: Secondary | ICD-10-CM | POA: Diagnosis present

## 2021-06-30 DIAGNOSIS — I25110 Atherosclerotic heart disease of native coronary artery with unstable angina pectoris: Secondary | ICD-10-CM | POA: Diagnosis not present

## 2021-06-30 DIAGNOSIS — Z955 Presence of coronary angioplasty implant and graft: Secondary | ICD-10-CM

## 2021-06-30 DIAGNOSIS — Z7985 Long-term (current) use of injectable non-insulin antidiabetic drugs: Secondary | ICD-10-CM

## 2021-06-30 DIAGNOSIS — K449 Diaphragmatic hernia without obstruction or gangrene: Secondary | ICD-10-CM | POA: Diagnosis present

## 2021-06-30 DIAGNOSIS — Z800 Family history of malignant neoplasm of digestive organs: Secondary | ICD-10-CM

## 2021-06-30 DIAGNOSIS — J441 Chronic obstructive pulmonary disease with (acute) exacerbation: Secondary | ICD-10-CM | POA: Diagnosis not present

## 2021-06-30 DIAGNOSIS — Z79899 Other long term (current) drug therapy: Secondary | ICD-10-CM

## 2021-06-30 DIAGNOSIS — F319 Bipolar disorder, unspecified: Secondary | ICD-10-CM | POA: Diagnosis present

## 2021-06-30 DIAGNOSIS — Z8249 Family history of ischemic heart disease and other diseases of the circulatory system: Secondary | ICD-10-CM

## 2021-06-30 DIAGNOSIS — K219 Gastro-esophageal reflux disease without esophagitis: Secondary | ICD-10-CM | POA: Diagnosis present

## 2021-06-30 DIAGNOSIS — E119 Type 2 diabetes mellitus without complications: Secondary | ICD-10-CM | POA: Diagnosis not present

## 2021-06-30 DIAGNOSIS — R918 Other nonspecific abnormal finding of lung field: Secondary | ICD-10-CM | POA: Diagnosis not present

## 2021-06-30 DIAGNOSIS — I252 Old myocardial infarction: Secondary | ICD-10-CM

## 2021-06-30 DIAGNOSIS — F4310 Post-traumatic stress disorder, unspecified: Secondary | ICD-10-CM | POA: Diagnosis present

## 2021-06-30 DIAGNOSIS — Z7982 Long term (current) use of aspirin: Secondary | ICD-10-CM

## 2021-06-30 DIAGNOSIS — I493 Ventricular premature depolarization: Secondary | ICD-10-CM | POA: Diagnosis not present

## 2021-06-30 DIAGNOSIS — F17210 Nicotine dependence, cigarettes, uncomplicated: Secondary | ICD-10-CM | POA: Diagnosis present

## 2021-06-30 DIAGNOSIS — F17200 Nicotine dependence, unspecified, uncomplicated: Secondary | ICD-10-CM

## 2021-06-30 DIAGNOSIS — J449 Chronic obstructive pulmonary disease, unspecified: Secondary | ICD-10-CM | POA: Diagnosis present

## 2021-06-30 DIAGNOSIS — Z833 Family history of diabetes mellitus: Secondary | ICD-10-CM | POA: Diagnosis not present

## 2021-06-30 DIAGNOSIS — I2510 Atherosclerotic heart disease of native coronary artery without angina pectoris: Secondary | ICD-10-CM | POA: Diagnosis not present

## 2021-06-30 DIAGNOSIS — I200 Unstable angina: Secondary | ICD-10-CM | POA: Diagnosis present

## 2021-06-30 DIAGNOSIS — Z452 Encounter for adjustment and management of vascular access device: Secondary | ICD-10-CM | POA: Diagnosis not present

## 2021-06-30 DIAGNOSIS — E785 Hyperlipidemia, unspecified: Secondary | ICD-10-CM | POA: Diagnosis present

## 2021-06-30 DIAGNOSIS — I25119 Atherosclerotic heart disease of native coronary artery with unspecified angina pectoris: Secondary | ICD-10-CM

## 2021-06-30 DIAGNOSIS — Z888 Allergy status to other drugs, medicaments and biological substances status: Secondary | ICD-10-CM

## 2021-06-30 DIAGNOSIS — Z8639 Personal history of other endocrine, nutritional and metabolic disease: Secondary | ICD-10-CM

## 2021-06-30 DIAGNOSIS — Z807 Family history of other malignant neoplasms of lymphoid, hematopoietic and related tissues: Secondary | ICD-10-CM | POA: Diagnosis not present

## 2021-06-30 DIAGNOSIS — Z880 Allergy status to penicillin: Secondary | ICD-10-CM | POA: Diagnosis not present

## 2021-06-30 DIAGNOSIS — D62 Acute posthemorrhagic anemia: Secondary | ICD-10-CM | POA: Diagnosis not present

## 2021-06-30 DIAGNOSIS — Z825 Family history of asthma and other chronic lower respiratory diseases: Secondary | ICD-10-CM

## 2021-06-30 HISTORY — PX: ENDOVEIN HARVEST OF GREATER SAPHENOUS VEIN: SHX5059

## 2021-06-30 HISTORY — PX: TEE WITHOUT CARDIOVERSION: SHX5443

## 2021-06-30 HISTORY — PX: CORONARY ARTERY BYPASS GRAFT: SHX141

## 2021-06-30 HISTORY — PX: RADIAL ARTERY HARVEST: SHX5067

## 2021-06-30 LAB — POCT I-STAT, CHEM 8
BUN: 13 mg/dL (ref 6–20)
BUN: 13 mg/dL (ref 6–20)
BUN: 13 mg/dL (ref 6–20)
BUN: 13 mg/dL (ref 6–20)
BUN: 11 mg/dL (ref 6–20)
BUN: 10 mg/dL (ref 6–20)
Calcium, Ion: 1.26 mmol/L (ref 1.15–1.40)
Calcium, Ion: 1.22 mmol/L (ref 1.15–1.40)
Calcium, Ion: 1.23 mmol/L (ref 1.15–1.40)
Calcium, Ion: 1.05 mmol/L — ABNORMAL LOW (ref 1.15–1.40)
Calcium, Ion: 0.95 mmol/L — ABNORMAL LOW (ref 1.15–1.40)
Calcium, Ion: 1.18 mmol/L (ref 1.15–1.40)
Chloride: 106 mmol/L (ref 98–111)
Chloride: 106 mmol/L (ref 98–111)
Chloride: 106 mmol/L (ref 98–111)
Chloride: 104 mmol/L (ref 98–111)
Chloride: 104 mmol/L (ref 98–111)
Chloride: 107 mmol/L (ref 98–111)
Creatinine, Ser: 0.6 mg/dL (ref 0.44–1.00)
Creatinine, Ser: 0.6 mg/dL (ref 0.44–1.00)
Creatinine, Ser: 0.6 mg/dL (ref 0.44–1.00)
Creatinine, Ser: 0.5 mg/dL (ref 0.44–1.00)
Creatinine, Ser: 0.5 mg/dL (ref 0.44–1.00)
Creatinine, Ser: 0.5 mg/dL (ref 0.44–1.00)
Glucose, Bld: 143 mg/dL — ABNORMAL HIGH (ref 70–99)
Glucose, Bld: 112 mg/dL — ABNORMAL HIGH (ref 70–99)
Glucose, Bld: 114 mg/dL — ABNORMAL HIGH (ref 70–99)
Glucose, Bld: 104 mg/dL — ABNORMAL HIGH (ref 70–99)
Glucose, Bld: 138 mg/dL — ABNORMAL HIGH (ref 70–99)
Glucose, Bld: 125 mg/dL — ABNORMAL HIGH (ref 70–99)
HCT: 33 % — ABNORMAL LOW (ref 36.0–46.0)
HCT: 32 % — ABNORMAL LOW (ref 36.0–46.0)
HCT: 32 % — ABNORMAL LOW (ref 36.0–46.0)
HCT: 21 % — ABNORMAL LOW (ref 36.0–46.0)
HCT: 20 % — ABNORMAL LOW (ref 36.0–46.0)
HCT: 24 % — ABNORMAL LOW (ref 36.0–46.0)
Hemoglobin: 11.2 g/dL — ABNORMAL LOW (ref 12.0–15.0)
Hemoglobin: 10.9 g/dL — ABNORMAL LOW (ref 12.0–15.0)
Hemoglobin: 10.9 g/dL — ABNORMAL LOW (ref 12.0–15.0)
Hemoglobin: 7.1 g/dL — ABNORMAL LOW (ref 12.0–15.0)
Hemoglobin: 6.8 g/dL — CL (ref 12.0–15.0)
Hemoglobin: 8.2 g/dL — ABNORMAL LOW (ref 12.0–15.0)
Potassium: 3.8 mmol/L (ref 3.5–5.1)
Potassium: 3.5 mmol/L (ref 3.5–5.1)
Potassium: 3.6 mmol/L (ref 3.5–5.1)
Potassium: 4.3 mmol/L (ref 3.5–5.1)
Potassium: 4.4 mmol/L (ref 3.5–5.1)
Potassium: 3.6 mmol/L (ref 3.5–5.1)
Sodium: 142 mmol/L (ref 135–145)
Sodium: 142 mmol/L (ref 135–145)
Sodium: 141 mmol/L (ref 135–145)
Sodium: 140 mmol/L (ref 135–145)
Sodium: 139 mmol/L (ref 135–145)
Sodium: 142 mmol/L (ref 135–145)
TCO2: 24 mmol/L (ref 22–32)
TCO2: 20 mmol/L — ABNORMAL LOW (ref 22–32)
TCO2: 22 mmol/L (ref 22–32)
TCO2: 24 mmol/L (ref 22–32)
TCO2: 24 mmol/L (ref 22–32)
TCO2: 22 mmol/L (ref 22–32)

## 2021-06-30 LAB — POCT I-STAT 7, (LYTES, BLD GAS, ICA,H+H)
Acid-Base Excess: 1 mmol/L (ref 0.0–2.0)
Acid-Base Excess: 0 mmol/L (ref 0.0–2.0)
Acid-base deficit: 2 mmol/L (ref 0.0–2.0)
Acid-base deficit: 2 mmol/L (ref 0.0–2.0)
Acid-base deficit: 3 mmol/L — ABNORMAL HIGH (ref 0.0–2.0)
Acid-base deficit: 5 mmol/L — ABNORMAL HIGH (ref 0.0–2.0)
Bicarbonate: 24.4 mmol/L (ref 20.0–28.0)
Bicarbonate: 26.1 mmol/L (ref 20.0–28.0)
Bicarbonate: 23.1 mmol/L (ref 20.0–28.0)
Bicarbonate: 22.6 mmol/L (ref 20.0–28.0)
Bicarbonate: 24.6 mmol/L (ref 20.0–28.0)
Bicarbonate: 20.6 mmol/L (ref 20.0–28.0)
Calcium, Ion: 1.26 mmol/L (ref 1.15–1.40)
Calcium, Ion: 1.02 mmol/L — ABNORMAL LOW (ref 1.15–1.40)
Calcium, Ion: 1.02 mmol/L — ABNORMAL LOW (ref 1.15–1.40)
Calcium, Ion: 1.2 mmol/L (ref 1.15–1.40)
Calcium, Ion: 1.42 mmol/L — ABNORMAL HIGH (ref 1.15–1.40)
Calcium, Ion: 1.25 mmol/L (ref 1.15–1.40)
HCT: 34 % — ABNORMAL LOW (ref 36.0–46.0)
HCT: 23 % — ABNORMAL LOW (ref 36.0–46.0)
HCT: 22 % — ABNORMAL LOW (ref 36.0–46.0)
HCT: 21 % — ABNORMAL LOW (ref 36.0–46.0)
HCT: 25 % — ABNORMAL LOW (ref 36.0–46.0)
HCT: 27 % — ABNORMAL LOW (ref 36.0–46.0)
Hemoglobin: 11.6 g/dL — ABNORMAL LOW (ref 12.0–15.0)
Hemoglobin: 7.8 g/dL — ABNORMAL LOW (ref 12.0–15.0)
Hemoglobin: 7.5 g/dL — ABNORMAL LOW (ref 12.0–15.0)
Hemoglobin: 7.1 g/dL — ABNORMAL LOW (ref 12.0–15.0)
Hemoglobin: 8.5 g/dL — ABNORMAL LOW (ref 12.0–15.0)
Hemoglobin: 9.2 g/dL — ABNORMAL LOW (ref 12.0–15.0)
O2 Saturation: 100 %
O2 Saturation: 100 %
O2 Saturation: 84 %
O2 Saturation: 100 %
O2 Saturation: 99 %
O2 Saturation: 90 %
Patient temperature: 36.6
Potassium: 3.9 mmol/L (ref 3.5–5.1)
Potassium: 3.4 mmol/L — ABNORMAL LOW (ref 3.5–5.1)
Potassium: 3.4 mmol/L — ABNORMAL LOW (ref 3.5–5.1)
Potassium: 3.7 mmol/L (ref 3.5–5.1)
Potassium: 3.8 mmol/L (ref 3.5–5.1)
Potassium: 3.7 mmol/L (ref 3.5–5.1)
Sodium: 142 mmol/L (ref 135–145)
Sodium: 141 mmol/L (ref 135–145)
Sodium: 142 mmol/L (ref 135–145)
Sodium: 142 mmol/L (ref 135–145)
Sodium: 142 mmol/L (ref 135–145)
Sodium: 141 mmol/L (ref 135–145)
TCO2: 26 mmol/L (ref 22–32)
TCO2: 27 mmol/L (ref 22–32)
TCO2: 24 mmol/L (ref 22–32)
TCO2: 24 mmol/L (ref 22–32)
TCO2: 26 mmol/L (ref 22–32)
TCO2: 22 mmol/L (ref 22–32)
pCO2 arterial: 46.8 mmHg (ref 32–48)
pCO2 arterial: 40.9 mmHg (ref 32–48)
pCO2 arterial: 39.4 mmHg (ref 32–48)
pCO2 arterial: 39.6 mmHg (ref 32–48)
pCO2 arterial: 41.6 mmHg (ref 32–48)
pCO2 arterial: 40.7 mmHg (ref 32–48)
pH, Arterial: 7.326 — ABNORMAL LOW (ref 7.35–7.45)
pH, Arterial: 7.413 (ref 7.35–7.45)
pH, Arterial: 7.377 (ref 7.35–7.45)
pH, Arterial: 7.342 — ABNORMAL LOW (ref 7.35–7.45)
pH, Arterial: 7.402 (ref 7.35–7.45)
pH, Arterial: 7.312 — ABNORMAL LOW (ref 7.35–7.45)
pO2, Arterial: 252 mmHg — ABNORMAL HIGH (ref 83–108)
pO2, Arterial: 437 mmHg — ABNORMAL HIGH (ref 83–108)
pO2, Arterial: 50 mmHg — ABNORMAL LOW (ref 83–108)
pO2, Arterial: 123 mmHg — ABNORMAL HIGH (ref 83–108)
pO2, Arterial: 476 mmHg — ABNORMAL HIGH (ref 83–108)
pO2, Arterial: 63 mmHg — ABNORMAL LOW (ref 83–108)

## 2021-06-30 LAB — GLUCOSE, CAPILLARY
Glucose-Capillary: 163 mg/dL — ABNORMAL HIGH (ref 70–99)
Glucose-Capillary: 133 mg/dL — ABNORMAL HIGH (ref 70–99)
Glucose-Capillary: 110 mg/dL — ABNORMAL HIGH (ref 70–99)
Glucose-Capillary: 121 mg/dL — ABNORMAL HIGH (ref 70–99)
Glucose-Capillary: 138 mg/dL — ABNORMAL HIGH (ref 70–99)

## 2021-06-30 LAB — CBC
HCT: 26.7 % — ABNORMAL LOW (ref 36.0–46.0)
HCT: 27.8 % — ABNORMAL LOW (ref 36.0–46.0)
Hemoglobin: 9.3 g/dL — ABNORMAL LOW (ref 12.0–15.0)
Hemoglobin: 9.7 g/dL — ABNORMAL LOW (ref 12.0–15.0)
MCH: 32 pg (ref 26.0–34.0)
MCH: 32.1 pg (ref 26.0–34.0)
MCHC: 34.8 g/dL (ref 30.0–36.0)
MCHC: 34.9 g/dL (ref 30.0–36.0)
MCV: 91.8 fL (ref 80.0–100.0)
MCV: 92.1 fL (ref 80.0–100.0)
Platelets: 150 10*3/uL (ref 150–400)
Platelets: 170 10*3/uL (ref 150–400)
RBC: 2.91 MIL/uL — ABNORMAL LOW (ref 3.87–5.11)
RBC: 3.02 MIL/uL — ABNORMAL LOW (ref 3.87–5.11)
RDW: 12.9 % (ref 11.5–15.5)
RDW: 13.1 % (ref 11.5–15.5)
WBC: 13.6 10*3/uL — ABNORMAL HIGH (ref 4.0–10.5)
WBC: 12.9 10*3/uL — ABNORMAL HIGH (ref 4.0–10.5)
nRBC: 0 % (ref 0.0–0.2)
nRBC: 0 % (ref 0.0–0.2)

## 2021-06-30 LAB — COMPREHENSIVE METABOLIC PANEL
ALT: 14 U/L (ref 0–44)
AST: 25 U/L (ref 15–41)
Albumin: 3.8 g/dL (ref 3.5–5.0)
Alkaline Phosphatase: 29 U/L — ABNORMAL LOW (ref 38–126)
Anion gap: 5 (ref 5–15)
BUN: 12 mg/dL (ref 6–20)
CO2: 22 mmol/L (ref 22–32)
Calcium: 7.9 mg/dL — ABNORMAL LOW (ref 8.9–10.3)
Chloride: 113 mmol/L — ABNORMAL HIGH (ref 98–111)
Creatinine, Ser: 0.62 mg/dL (ref 0.44–1.00)
GFR, Estimated: >60 mL/min (ref >60)
Glucose, Bld: 132 mg/dL — ABNORMAL HIGH (ref 70–99)
Potassium: 4.5 mmol/L (ref 3.5–5.1)
Sodium: 140 mmol/L (ref 135–145)
Total Bilirubin: 0.9 mg/dL (ref 0.3–1.2)
Total Protein: 5.6 g/dL — ABNORMAL LOW (ref 6.5–8.1)

## 2021-06-30 LAB — MAGNESIUM: Magnesium: 3 mg/dL — ABNORMAL HIGH (ref 1.7–2.4)

## 2021-06-30 LAB — APTT: aPTT: 30 seconds (ref 24–36)

## 2021-06-30 LAB — SURGICAL PCR SCREEN

## 2021-06-30 LAB — HEMOGLOBIN AND HEMATOCRIT, BLOOD
HCT: 20.4 % — ABNORMAL LOW (ref 36.0–46.0)
Hemoglobin: 7 g/dL — ABNORMAL LOW (ref 12.0–15.0)

## 2021-06-30 LAB — PROTIME-INR
INR: 1.4 — ABNORMAL HIGH (ref 0.8–1.2)
Prothrombin Time: 16.9 seconds — ABNORMAL HIGH (ref 11.4–15.2)

## 2021-06-30 LAB — ABO/RH: ABO/RH(D): O POS

## 2021-06-30 LAB — PLATELET COUNT: Platelets: 168 10*3/uL (ref 150–400)

## 2021-06-30 SURGERY — CORONARY ARTERY BYPASS GRAFTING (CABG)
Anesthesia: General | Site: Chest | Laterality: Right

## 2021-06-30 MED ORDER — NOREPINEPHRINE 4 MG/250ML-% IV SOLN: 0.0000 ug/min | INTRAVENOUS | Status: DC

## 2021-06-30 MED ORDER — METOPROLOL TARTRATE 12.5 MG HALF TABLET: 12.5000 mg | ORAL_TABLET | Freq: Two times a day (BID) | ORAL | Status: DC

## 2021-06-30 MED ORDER — HEPARIN SODIUM (PORCINE) 1000 UNIT/ML IJ SOLN
INTRAMUSCULAR | Status: DC | PRN
  Administered 2021-06-30: 33000 [IU] via INTRAVENOUS

## 2021-06-30 MED ORDER — ACETAMINOPHEN 500 MG PO TABS
1000.0000 mg | ORAL_TABLET | Freq: Four times a day (QID) | ORAL | Status: DC
  Administered 2021-07-01 – 2021-07-05 (×15): 1000 mg via ORAL
  Filled 2021-06-30 (×15): qty 2

## 2021-06-30 MED ORDER — ASPIRIN 81 MG PO CHEW: 324.0000 mg | CHEWABLE_TABLET | Freq: Every day | ORAL | Status: DC

## 2021-06-30 MED ORDER — PROTAMINE SULFATE 10 MG/ML IV SOLN
INTRAVENOUS | Status: DC | PRN
  Administered 2021-06-30: 330 mg via INTRAVENOUS

## 2021-06-30 MED ORDER — FENTANYL CITRATE (PF) 250 MCG/5ML IJ SOLN
INTRAMUSCULAR | Status: AC
  Filled 2021-06-30: qty 5

## 2021-06-30 MED ORDER — CHLORHEXIDINE GLUCONATE 0.12 % MT SOLN
15.0000 mL | OROMUCOSAL | Status: AC
  Administered 2021-06-30: 15 mL via OROMUCOSAL

## 2021-06-30 MED ORDER — SODIUM CHLORIDE 0.9 % IV SOLN: INTRAVENOUS | Status: DC

## 2021-06-30 MED ORDER — SODIUM CHLORIDE 0.9 % IV SOLN: INTRAVENOUS | Status: DC | PRN

## 2021-06-30 MED ORDER — CEFAZOLIN SODIUM-DEXTROSE 2-4 GM/100ML-% IV SOLN
2.0000 g | Freq: Three times a day (TID) | INTRAVENOUS | Status: AC
  Administered 2021-06-30 – 2021-07-02 (×6): 2 g via INTRAVENOUS
  Filled 2021-06-30 (×6): qty 100

## 2021-06-30 MED ORDER — INSULIN ASPART 100 UNIT/ML IJ SOLN
INTRAMUSCULAR | Status: AC
  Administered 2021-06-30: 2 [IU] via SUBCUTANEOUS
  Filled 2021-06-30: qty 1

## 2021-06-30 MED ORDER — NITROGLYCERIN IN D5W 200-5 MCG/ML-% IV SOLN: 0.0000 ug/min | INTRAVENOUS | Status: DC

## 2021-06-30 MED ORDER — POLYVINYL ALCOHOL 1.4 % OP SOLN: 1.0000 [drp] | Freq: Every day | OPHTHALMIC | Status: DC | PRN

## 2021-06-30 MED ORDER — METOPROLOL TARTRATE 12.5 MG HALF TABLET: 12.5000 mg | ORAL_TABLET | Freq: Once | ORAL | Status: DC

## 2021-06-30 MED ORDER — PROPOFOL 10 MG/ML IV BOLUS
INTRAVENOUS | Status: DC | PRN
  Administered 2021-06-30: 30 mg via INTRAVENOUS
  Administered 2021-06-30 (×2): 50 mg via INTRAVENOUS
  Administered 2021-06-30: 20 mg via INTRAVENOUS

## 2021-06-30 MED ORDER — PROTAMINE SULFATE 10 MG/ML IV SOLN
INTRAVENOUS | Status: AC
  Filled 2021-06-30: qty 25

## 2021-06-30 MED ORDER — SODIUM CHLORIDE 0.9% FLUSH
3.0000 mL | Freq: Two times a day (BID) | INTRAVENOUS | Status: DC
  Administered 2021-07-01 – 2021-07-02 (×3): 3 mL via INTRAVENOUS

## 2021-06-30 MED ORDER — ONDANSETRON HCL 4 MG/2ML IJ SOLN
INTRAMUSCULAR | Status: DC | PRN
  Administered 2021-06-30: 4 mg via INTRAVENOUS

## 2021-06-30 MED ORDER — MAGNESIUM SULFATE 4 GM/100ML IV SOLN
4.0000 g | Freq: Once | INTRAVENOUS | Status: AC
  Administered 2021-06-30: 4 g via INTRAVENOUS
  Filled 2021-06-30: qty 100

## 2021-06-30 MED ORDER — INSULIN REGULAR(HUMAN) IN NACL 100-0.9 UT/100ML-% IV SOLN: INTRAVENOUS | Status: DC

## 2021-06-30 MED ORDER — LACTATED RINGERS IV SOLN: INTRAVENOUS | Status: DC

## 2021-06-30 MED ORDER — ORAL CARE MOUTH RINSE
15.0000 mL | OROMUCOSAL | Status: DC
  Administered 2021-06-30 – 2021-07-01 (×3): 15 mL via OROMUCOSAL

## 2021-06-30 MED ORDER — MORPHINE SULFATE (PF) 2 MG/ML IV SOLN
1.0000 mg | INTRAVENOUS | Status: DC | PRN
  Administered 2021-07-01 (×6): 2 mg via INTRAVENOUS
  Filled 2021-06-30 (×2): qty 1
  Filled 2021-06-30: qty 2
  Filled 2021-06-30 (×4): qty 1

## 2021-06-30 MED ORDER — DEXMEDETOMIDINE HCL IN NACL 400 MCG/100ML IV SOLN
0.0000 ug/kg/h | INTRAVENOUS | Status: DC
  Administered 2021-06-30: 0.4 ug/kg/h via INTRAVENOUS
  Filled 2021-06-30: qty 100

## 2021-06-30 MED ORDER — METOPROLOL TARTRATE 5 MG/5ML IV SOLN: 2.5000 mg | INTRAVENOUS | Status: DC | PRN

## 2021-06-30 MED ORDER — LACTATED RINGERS IV SOLN: INTRAVENOUS | Status: DC | PRN

## 2021-06-30 MED ORDER — SODIUM CHLORIDE (PF) 0.9 % IJ SOLN
OROMUCOSAL | Status: DC | PRN
  Administered 2021-06-30 (×2): 4 mL via TOPICAL

## 2021-06-30 MED ORDER — PANTOPRAZOLE SODIUM 40 MG PO TBEC
40.0000 mg | DELAYED_RELEASE_TABLET | Freq: Every day | ORAL | Status: DC
  Administered 2021-07-02 – 2021-07-05 (×4): 40 mg via ORAL
  Filled 2021-06-30 (×4): qty 1

## 2021-06-30 MED ORDER — CHLORHEXIDINE GLUCONATE CLOTH 2 % EX PADS
6.0000 | MEDICATED_PAD | Freq: Every day | CUTANEOUS | Status: DC
Start: 2021-06-30 — End: 2021-07-01
  Administered 2021-06-30: 6 via TOPICAL

## 2021-06-30 MED ORDER — NITROGLYCERIN IN D5W 200-5 MCG/ML-% IV SOLN: 7.0000 ug/min | INTRAVENOUS | Status: DC

## 2021-06-30 MED ORDER — DEXTROSE 50 % IV SOLN: 0.0000 mL | INTRAVENOUS | Status: DC | PRN

## 2021-06-30 MED ORDER — FENTANYL CITRATE (PF) 250 MCG/5ML IJ SOLN
INTRAMUSCULAR | Status: DC | PRN
  Administered 2021-06-30 (×5): 50 ug via INTRAVENOUS
  Administered 2021-06-30: 150 ug via INTRAVENOUS
  Administered 2021-06-30: 50 ug via INTRAVENOUS
  Administered 2021-06-30: 100 ug via INTRAVENOUS
  Administered 2021-06-30: 150 ug via INTRAVENOUS
  Administered 2021-06-30: 100 ug via INTRAVENOUS
  Administered 2021-06-30 (×2): 50 ug via INTRAVENOUS
  Administered 2021-06-30 (×2): 100 ug via INTRAVENOUS
  Administered 2021-06-30: 50 ug via INTRAVENOUS

## 2021-06-30 MED ORDER — ASPIRIN 325 MG PO TBEC
325.0000 mg | DELAYED_RELEASE_TABLET | Freq: Every day | ORAL | Status: DC
  Administered 2021-07-01 – 2021-07-05 (×5): 325 mg via ORAL
  Filled 2021-06-30 (×5): qty 1

## 2021-06-30 MED ORDER — MIDAZOLAM HCL 2 MG/2ML IJ SOLN: 2.0000 mg | INTRAMUSCULAR | Status: DC | PRN

## 2021-06-30 MED ORDER — POTASSIUM CHLORIDE 10 MEQ/50ML IV SOLN
10.0000 meq | INTRAVENOUS | Status: AC
  Administered 2021-06-30 (×3): 10 meq via INTRAVENOUS

## 2021-06-30 MED ORDER — PROPOFOL 10 MG/ML IV BOLUS
INTRAVENOUS | Status: AC
  Filled 2021-06-30: qty 20

## 2021-06-30 MED ORDER — SODIUM CHLORIDE 0.9% FLUSH: 3.0000 mL | INTRAVENOUS | Status: DC | PRN

## 2021-06-30 MED ORDER — ARTIFICIAL TEARS OPHTHALMIC OINT
TOPICAL_OINTMENT | OPHTHALMIC | Status: DC | PRN
  Administered 2021-06-30: 1 via OPHTHALMIC

## 2021-06-30 MED ORDER — MIDAZOLAM HCL (PF) 5 MG/ML IJ SOLN
INTRAMUSCULAR | Status: DC | PRN
  Administered 2021-06-30 (×2): 2 mg via INTRAVENOUS
  Administered 2021-06-30: 1 mg via INTRAVENOUS
  Administered 2021-06-30: 2 mg via INTRAVENOUS

## 2021-06-30 MED ORDER — ATORVASTATIN CALCIUM 40 MG PO TABS
40.0000 mg | ORAL_TABLET | Freq: Every day | ORAL | Status: DC
  Administered 2021-07-01 – 2021-07-05 (×5): 40 mg via ORAL
  Filled 2021-06-30 (×5): qty 1

## 2021-06-30 MED ORDER — ACETAMINOPHEN 650 MG RE SUPP
650.0000 mg | Freq: Once | RECTAL | Status: AC
  Administered 2021-06-30: 650 mg via RECTAL

## 2021-06-30 MED ORDER — ALBUMIN HUMAN 5 % IV SOLN: INTRAVENOUS | Status: DC | PRN

## 2021-06-30 MED ORDER — DOCUSATE SODIUM 100 MG PO CAPS
200.0000 mg | ORAL_CAPSULE | Freq: Every day | ORAL | Status: DC
  Administered 2021-07-01 – 2021-07-05 (×5): 200 mg via ORAL
  Filled 2021-06-30 (×5): qty 2

## 2021-06-30 MED ORDER — MIDAZOLAM HCL (PF) 10 MG/2ML IJ SOLN
INTRAMUSCULAR | Status: AC
  Filled 2021-06-30: qty 2

## 2021-06-30 MED ORDER — ACETAMINOPHEN 160 MG/5ML PO SOLN: 650.0000 mg | Freq: Once | ORAL | Status: AC

## 2021-06-30 MED ORDER — ORAL CARE MOUTH RINSE: 15.0000 mL | OROMUCOSAL | Status: DC | PRN

## 2021-06-30 MED ORDER — OXYCODONE HCL 5 MG PO TABS
5.0000 mg | ORAL_TABLET | ORAL | Status: DC | PRN
  Administered 2021-07-01 – 2021-07-03 (×8): 10 mg via ORAL
  Administered 2021-07-03: 5 mg via ORAL
  Administered 2021-07-04 – 2021-07-05 (×5): 10 mg via ORAL
  Filled 2021-06-30 (×3): qty 2
  Filled 2021-06-30: qty 1
  Filled 2021-06-30 (×10): qty 2

## 2021-06-30 MED ORDER — BISACODYL 5 MG PO TBEC
10.0000 mg | DELAYED_RELEASE_TABLET | Freq: Every day | ORAL | Status: DC
  Administered 2021-07-01 – 2021-07-05 (×5): 10 mg via ORAL
  Filled 2021-06-30 (×5): qty 2

## 2021-06-30 MED ORDER — ALBUMIN HUMAN 5 % IV SOLN
250.0000 mL | INTRAVENOUS | Status: DC | PRN
  Administered 2021-06-30 (×2): 12.5 g via INTRAVENOUS

## 2021-06-30 MED ORDER — BISACODYL 10 MG RE SUPP: 10.0000 mg | Freq: Every day | RECTAL | Status: DC

## 2021-06-30 MED ORDER — ALBUTEROL SULFATE HFA 108 (90 BASE) MCG/ACT IN AERS
INHALATION_SPRAY | RESPIRATORY_TRACT | Status: DC | PRN
  Administered 2021-06-30: 2 via RESPIRATORY_TRACT

## 2021-06-30 MED ORDER — ACETAMINOPHEN 160 MG/5ML PO SOLN
1000.0000 mg | Freq: Four times a day (QID) | ORAL | Status: DC
  Administered 2021-06-30: 1000 mg
  Filled 2021-06-30: qty 40.6

## 2021-06-30 MED ORDER — HEPARIN SODIUM (PORCINE) 1000 UNIT/ML IJ SOLN
INTRAMUSCULAR | Status: AC
Start: 2021-06-30 — End: ?
  Filled 2021-06-30: qty 10

## 2021-06-30 MED ORDER — ONDANSETRON HCL 4 MG/2ML IJ SOLN
4.0000 mg | Freq: Four times a day (QID) | INTRAMUSCULAR | Status: DC | PRN
  Administered 2021-07-01: 4 mg via INTRAVENOUS
  Filled 2021-06-30: qty 2

## 2021-06-30 MED ORDER — ORAL CARE MOUTH RINSE: 15.0000 mL | Freq: Once | OROMUCOSAL | Status: AC

## 2021-06-30 MED ORDER — CHLORHEXIDINE GLUCONATE 4 % EX LIQD: 30.0000 mL | CUTANEOUS | Status: DC

## 2021-06-30 MED ORDER — TRAMADOL HCL 50 MG PO TABS
50.0000 mg | ORAL_TABLET | ORAL | Status: DC | PRN
  Administered 2021-07-01 – 2021-07-03 (×3): 100 mg via ORAL
  Filled 2021-06-30 (×3): qty 2

## 2021-06-30 MED ORDER — CHLORHEXIDINE GLUCONATE CLOTH 2 % EX PADS
6.0000 | MEDICATED_PAD | Freq: Every day | CUTANEOUS | Status: DC
  Administered 2021-07-01 – 2021-07-02 (×2): 6 via TOPICAL

## 2021-06-30 MED ORDER — ROCURONIUM BROMIDE 10 MG/ML (PF) SYRINGE
PREFILLED_SYRINGE | INTRAVENOUS | Status: DC | PRN
  Administered 2021-06-30: 80 mg via INTRAVENOUS
  Administered 2021-06-30 (×2): 50 mg via INTRAVENOUS
  Administered 2021-06-30: 20 mg via INTRAVENOUS
  Administered 2021-06-30: 50 mg via INTRAVENOUS

## 2021-06-30 MED ORDER — SODIUM CHLORIDE 0.9% FLUSH
10.0000 mL | Freq: Two times a day (BID) | INTRAVENOUS | Status: DC
  Administered 2021-06-30: 10 mL
  Administered 2021-07-01: 20 mL
  Administered 2021-07-02: 10 mL

## 2021-06-30 MED ORDER — 0.9 % SODIUM CHLORIDE (POUR BTL) OPTIME
TOPICAL | Status: DC | PRN
  Administered 2021-06-30: 1000 mL
  Administered 2021-06-30: 5000 mL

## 2021-06-30 MED ORDER — SODIUM CHLORIDE 0.9% FLUSH: 10.0000 mL | INTRAVENOUS | Status: DC | PRN

## 2021-06-30 MED ORDER — LEVOFLOXACIN IN D5W 750 MG/150ML IV SOLN: 750.0000 mg | INTRAVENOUS | Status: DC

## 2021-06-30 MED ORDER — SODIUM CHLORIDE 0.45 % IV SOLN: INTRAVENOUS | Status: DC | PRN

## 2021-06-30 MED ORDER — CHLORHEXIDINE GLUCONATE 0.12 % MT SOLN
15.0000 mL | Freq: Once | OROMUCOSAL | Status: AC
  Administered 2021-06-30: 15 mL via OROMUCOSAL
  Filled 2021-06-30: qty 15

## 2021-06-30 MED ORDER — PLASMA-LYTE A IV SOLN
INTRAVENOUS | Status: DC | PRN
  Administered 2021-06-30: 500 mL via INTRAVASCULAR

## 2021-06-30 MED ORDER — FAMOTIDINE IN NACL 20-0.9 MG/50ML-% IV SOLN
20.0000 mg | Freq: Two times a day (BID) | INTRAVENOUS | Status: AC
  Administered 2021-06-30 (×2): 20 mg via INTRAVENOUS
  Filled 2021-06-30 (×2): qty 50

## 2021-06-30 MED ORDER — INSULIN ASPART 100 UNIT/ML IJ SOLN: 0.0000 [IU] | INTRAMUSCULAR | Status: DC | PRN

## 2021-06-30 MED ORDER — SODIUM CHLORIDE 0.9 % IV SOLN: 250.0000 mL | INTRAVENOUS | Status: DC

## 2021-06-30 MED ORDER — PHENYLEPHRINE 80 MCG/ML (10ML) SYRINGE FOR IV PUSH (FOR BLOOD PRESSURE SUPPORT)
PREFILLED_SYRINGE | INTRAVENOUS | Status: DC | PRN
  Administered 2021-06-30: 80 ug via INTRAVENOUS

## 2021-06-30 MED ORDER — LACTATED RINGERS IV SOLN: 500.0000 mL | Freq: Once | INTRAVENOUS | Status: DC | PRN

## 2021-06-30 MED ORDER — LACTATED RINGERS IV SOLN
INTRAVENOUS | Status: DC
Start: 2021-06-30 — End: 2021-07-05

## 2021-06-30 MED ORDER — HEPARIN SODIUM (PORCINE) 1000 UNIT/ML IJ SOLN
INTRAMUSCULAR | Status: AC
Start: 2021-06-30 — End: ?
  Filled 2021-06-30: qty 1

## 2021-06-30 MED ORDER — VANCOMYCIN HCL IN DEXTROSE 1-5 GM/200ML-% IV SOLN
1000.0000 mg | Freq: Once | INTRAVENOUS | Status: AC
  Administered 2021-06-30: 1000 mg via INTRAVENOUS
  Filled 2021-06-30: qty 200

## 2021-06-30 MED ORDER — ~~LOC~~ CARDIAC SURGERY, PATIENT & FAMILY EDUCATION
Freq: Once | Status: DC
  Filled 2021-06-30: qty 1

## 2021-06-30 MED ORDER — PHENYLEPHRINE HCL-NACL 20-0.9 MG/250ML-% IV SOLN
INTRAVENOUS | Status: DC | PRN
  Administered 2021-06-30: 40 ug/min via INTRAVENOUS

## 2021-06-30 MED ORDER — METOPROLOL TARTRATE 25 MG/10 ML ORAL SUSPENSION: 12.5000 mg | Freq: Two times a day (BID) | ORAL | Status: DC

## 2021-06-30 MED ORDER — CHLORHEXIDINE GLUCONATE 0.12 % MT SOLN: 15.0000 mL | Freq: Once | OROMUCOSAL | Status: DC

## 2021-06-30 SURGICAL SUPPLY — 104 items
ADH SKN CLS APL DERMABOND .7 (GAUZE/BANDAGES/DRESSINGS) ×4
BAG DECANTER FOR FLEXI CONT (MISCELLANEOUS) ×6 IMPLANT
BINDER BREAST XLRG (GAUZE/BANDAGES/DRESSINGS) ×1 IMPLANT
BLADE STERNUM SYSTEM 6 (BLADE) ×6 IMPLANT
BLADE SURG 15 STRL LF DISP TIS (BLADE) ×5 IMPLANT
BLADE SURG 15 STRL SS (BLADE) ×10
BNDG CMPR MED 10X6 ELC LF (GAUZE/BANDAGES/DRESSINGS) ×4
BNDG ELASTIC 4X5.8 VLCR STR LF (GAUZE/BANDAGES/DRESSINGS) ×12 IMPLANT
BNDG ELASTIC 6X10 VLCR STRL LF (GAUZE/BANDAGES/DRESSINGS) ×1 IMPLANT
BNDG ELASTIC 6X5.8 VLCR STR LF (GAUZE/BANDAGES/DRESSINGS) ×6 IMPLANT
BNDG GAUZE ELAST 4 BULKY (GAUZE/BANDAGES/DRESSINGS) ×7 IMPLANT
CABLE SURGICAL S-101-97-12 (CABLE) ×6 IMPLANT
CANISTER SUCT 3000ML PPV (MISCELLANEOUS) ×6 IMPLANT
CANNULA MC2 2 STG 29/37 NON-V (CANNULA) ×5 IMPLANT
CANNULA MC2 TWO STAGE (CANNULA) ×5
CANNULA NON VENT 20FR 12 (CANNULA) ×6 IMPLANT
CATH ROBINSON RED A/P 18FR (CATHETERS) ×12 IMPLANT
CLIP VESOCCLUDE MED 24/CT (CLIP) IMPLANT
CLIP VESOCCLUDE SM WIDE 24/CT (CLIP) IMPLANT
CONN ST 1/2X1/2  BEN (MISCELLANEOUS) ×5
CONN ST 1/2X1/2 BEN (MISCELLANEOUS) ×5 IMPLANT
CONNECTOR BLAKE 2:1 CARIO BLK (MISCELLANEOUS) ×6 IMPLANT
CONTAINER PROTECT SURGISLUSH (MISCELLANEOUS) ×12 IMPLANT
COVER MAYO STAND STRL (DRAPES) ×6 IMPLANT
DERMABOND ADVANCED (GAUZE/BANDAGES/DRESSINGS) ×1
DERMABOND ADVANCED .7 DNX12 (GAUZE/BANDAGES/DRESSINGS) IMPLANT
DRAIN CHANNEL 19F RND (DRAIN) ×18 IMPLANT
DRAIN CONNECTOR BLAKE 1:1 (MISCELLANEOUS) ×6 IMPLANT
DRAPE CARDIOVASCULAR INCISE (DRAPES) ×5
DRAPE EXTREMITY T 121X128X90 (DISPOSABLE) ×6 IMPLANT
DRAPE HALF SHEET 40X57 (DRAPES) ×6 IMPLANT
DRAPE INCISE IOBAN 66X45 STRL (DRAPES) IMPLANT
DRAPE SRG 135X102X78XABS (DRAPES) ×5 IMPLANT
DRAPE WARM FLUID 44X44 (DRAPES) ×6 IMPLANT
DRSG AQUACEL AG ADV 3.5X10 (GAUZE/BANDAGES/DRESSINGS) ×6 IMPLANT
DRSG AQUACEL AG ADV 3.5X14 (GAUZE/BANDAGES/DRESSINGS) ×6 IMPLANT
DRSG COVADERM 4X14 (GAUZE/BANDAGES/DRESSINGS) ×6 IMPLANT
ELECT BLADE 4.0 EZ CLEAN MEGAD (MISCELLANEOUS) ×5
ELECT REM PT RETURN 9FT ADLT (ELECTROSURGICAL) ×10
ELECTRODE BLDE 4.0 EZ CLN MEGD (MISCELLANEOUS) ×5 IMPLANT
ELECTRODE REM PT RTRN 9FT ADLT (ELECTROSURGICAL) ×10 IMPLANT
FELT TEFLON 1X6 (MISCELLANEOUS) ×12 IMPLANT
GAUZE 4X4 16PLY ~~LOC~~+RFID DBL (SPONGE) ×12 IMPLANT
GAUZE SPONGE 4X4 12PLY STRL (GAUZE/BANDAGES/DRESSINGS) ×12 IMPLANT
GAUZE SPONGE 4X4 12PLY STRL LF (GAUZE/BANDAGES/DRESSINGS) ×1 IMPLANT
GLOVE BIO SURGEON STRL SZ7 (GLOVE) ×12 IMPLANT
GLOVE BIOGEL M STRL SZ7.5 (GLOVE) ×12 IMPLANT
GLOVE BIOGEL PI IND STRL 6.5 (GLOVE) IMPLANT
GLOVE BIOGEL PI INDICATOR 6.5 (GLOVE) ×2
GLOVE SURG SS PI 7.5 STRL IVOR (GLOVE) ×1 IMPLANT
GOWN STRL REUS W/ TWL LRG LVL3 (GOWN DISPOSABLE) ×20 IMPLANT
GOWN STRL REUS W/ TWL XL LVL3 (GOWN DISPOSABLE) ×10 IMPLANT
GOWN STRL REUS W/TWL LRG LVL3 (GOWN DISPOSABLE) ×20
GOWN STRL REUS W/TWL XL LVL3 (GOWN DISPOSABLE) ×25
HEMOSTAT POWDER SURGIFOAM 1G (HEMOSTASIS) ×17 IMPLANT
INSERT SUTURE HOLDER (MISCELLANEOUS) ×6 IMPLANT
KIT BASIN OR (CUSTOM PROCEDURE TRAY) ×6 IMPLANT
KIT SUCTION CATH 14FR (SUCTIONS) ×6 IMPLANT
KIT TURNOVER KIT B (KITS) ×12 IMPLANT
KIT VASOVIEW HEMOPRO 2 VH 4000 (KITS) ×6 IMPLANT
LEAD PACING MYOCARDI (MISCELLANEOUS) ×6 IMPLANT
MARKER GRAFT CORONARY BYPASS (MISCELLANEOUS) ×19 IMPLANT
NS IRRIG 1000ML POUR BTL (IV SOLUTION) ×30 IMPLANT
OFFPUMP STABILIZER SUV (MISCELLANEOUS) ×1 IMPLANT
PACK ACCESSORY CANNULA KIT (KITS) ×6 IMPLANT
PACK E OPEN HEART (SUTURE) ×6 IMPLANT
PACK OPEN HEART (CUSTOM PROCEDURE TRAY) ×6 IMPLANT
PAD ARMBOARD 7.5X6 YLW CONV (MISCELLANEOUS) ×24 IMPLANT
PAD ELECT DEFIB RADIOL ZOLL (MISCELLANEOUS) ×6 IMPLANT
PENCIL BUTTON HOLSTER BLD 10FT (ELECTRODE) ×6 IMPLANT
POSITIONER ACROBAT-I OFFPUMP (MISCELLANEOUS) ×1 IMPLANT
POSITIONER HEAD DONUT 9IN (MISCELLANEOUS) ×6 IMPLANT
PUNCH AORTIC ROTATE 4.0MM (MISCELLANEOUS) ×6 IMPLANT
SET MPS 3-ND DEL (MISCELLANEOUS) ×1 IMPLANT
SHEARS HARMONIC 9CM CVD (BLADE) ×6 IMPLANT
SPONGE T-LAP 18X18 ~~LOC~~+RFID (SPONGE) ×48 IMPLANT
SUPPORT HEART JANKE-BARRON (MISCELLANEOUS) ×6 IMPLANT
SUT BONE WAX W31G (SUTURE) ×6 IMPLANT
SUT ETHIBOND X763 2 0 SH 1 (SUTURE) ×12 IMPLANT
SUT MNCRL AB 3-0 PS2 18 (SUTURE) ×12 IMPLANT
SUT MNCRL AB 4-0 PS2 18 (SUTURE) ×2 IMPLANT
SUT PDS AB 1 CTX 36 (SUTURE) ×12 IMPLANT
SUT PROLENE 3 0 SH DA (SUTURE) ×1 IMPLANT
SUT PROLENE 4 0 SH DA (SUTURE) ×6 IMPLANT
SUT PROLENE 5 0 C 1 36 (SUTURE) ×18 IMPLANT
SUT PROLENE 7 0 BV 1 (SUTURE) ×6 IMPLANT
SUT PROLENE 7 0 BV1 MDA (SUTURE) ×8 IMPLANT
SUT STEEL 6MS V (SUTURE) ×12 IMPLANT
SUT STEEL STERNAL CCS#1 18IN (SUTURE) ×3 IMPLANT
SUT VIC AB 2-0 CT1 27 (SUTURE) ×10
SUT VIC AB 2-0 CT1 TAPERPNT 27 (SUTURE) IMPLANT
SUT VIC AB 3-0 SH 27 (SUTURE)
SUT VIC AB 3-0 SH 27X BRD (SUTURE) IMPLANT
SUT VIC AB 3-0 X1 27 (SUTURE) IMPLANT
SYR 50ML SLIP (SYRINGE) IMPLANT
SYSTEM SAHARA CHEST DRAIN ATS (WOUND CARE) ×6 IMPLANT
TAPE CLOTH SURG 4X10 WHT LF (GAUZE/BANDAGES/DRESSINGS) ×1 IMPLANT
TAPE PAPER 2X10 WHT MICROPORE (GAUZE/BANDAGES/DRESSINGS) ×1 IMPLANT
TOWEL GREEN STERILE (TOWEL DISPOSABLE) ×12 IMPLANT
TOWEL GREEN STERILE FF (TOWEL DISPOSABLE) ×12 IMPLANT
TRAY FOLEY SLVR 16FR TEMP STAT (SET/KITS/TRAYS/PACK) ×6 IMPLANT
TUBING LAP HI FLOW INSUFFLATIO (TUBING) ×6 IMPLANT
UNDERPAD 30X36 HEAVY ABSORB (UNDERPADS AND DIAPERS) ×12 IMPLANT
WATER STERILE IRR 1000ML POUR (IV SOLUTION) ×12 IMPLANT

## 2021-06-30 NOTE — Interval H&P Note (Signed)
History and Physical Interval Note:  06/30/2021 8:52 AM  Alice Reichert  has presented today for surgery, with the diagnosis of CAD.  The various methods of treatment have been discussed with the patient and family. After consideration of risks, benefits and other options for treatment, the patient has consented to  Procedure(s): CORONARY ARTERY BYPASS GRAFTING (CABG) (N/A) RADIAL ARTERY HARVEST (Right) TRANSESOPHAGEAL ECHOCARDIOGRAM (TEE) (N/A) as a surgical intervention.  The patient's history has been reviewed, patient examined, no change in status, stable for surgery.  I have reviewed the patient's chart and labs.  Questions were answered to the patient's satisfaction.     Jaelynne Hockley Bary Leriche

## 2021-06-30 NOTE — Progress Notes (Signed)
Rapid wean initiated per protocol  

## 2021-06-30 NOTE — Consult Note (Signed)
NAME:  Cassidy [name], MRN:  [id number], DOB:  [birth year], LOS: 0 ADMISSION DATE:  06/30/2021, CONSULTATION DATE:  06/30/21 REFERRING MD:  Dr. Kipp Brood, CHIEF COMPLAINT:  CABG   History of Present Illness:   56 year old female with PMH as below found recently on elective LHC to have severe three vessel disease after having progressive exertional anginal symptoms and progressive exertional dyspnea.  Presented 6/20 for CABG x 3 (LIMA-LAD, Rt radial artery- OM, and SVG-Distal RCA).  Surgery without complication.  PCCM consulted for medical management while in ICU post-operatively.   Pertinent  Medical History  Tobacco abuse, COPD, CAD with prior PCI and stent, DMT2, HTN, HLD, GERD, diabetic neuropathy, depression, bipolar, anemia   Significant Hospital Events: Including procedures, antibiotic start and stop dates in addition to other pertinent events   6/20 CABG x 3  Interim History / Subjective:    Objective   Blood pressure 102/66, pulse 64, temperature 98 F (36.7 C), resp. rate 17, height 5\' 7"  (1.702 m), weight 93.9 kg, last menstrual period 06/12/2016, SpO2 98 %.        Intake/Output Summary (Last 24 hours) at 06/30/2021 1711 Last data filed at 06/30/2021 1632 Gross per 24 hour  Intake 2770 ml  Output 2800 ml  Net -30 ml   Filed Weights   06/30/21 0849  Weight: 93.9 kg   Examination:   Physical exam: General: Crtitically ill-appearing female, orally intubated HEENT: [place]/AT, eyes anicteric.  ETT and OGT in place Neuro: Sedated, not following commands.  Eyes are closed.  Pupils 3 mm bilateral reactive to light Chest: Left-sided chest tube in place coarse breath sounds, no wheezes or rhonchi Heart: Regular rate and rhythm, no murmurs or gallops Abdomen: Soft, nontender, nondistended, bowel sounds present Skin: No rash  Resolved Hospital Problem list    Assessment & Plan:  CAD s/p three-vessel CABG with radial artery harvest  Continue aspirin and statin Chest tube  management TCTS Continue pain control with tramadol, oxycodone and fentanyl Monitor CI, CO via FloTrac  Acute respiratory insufficiency, postop COPD, not in exacerbation Tobacco abuse  Rapid weaning protocol is in place Pulmonary hygiene F/u ABG PAD protocol for sedation: Precedex for goal RASS 0 to -1 Continue as needed bronchodilator We will hold off on nicotine patch for now, she may need it tomorrow to prevent nicotine withdrawal  DMT2, controlled Hemoglobin A1c 6.8 Continue insulin gtt managed by endotool Goal CBG between 110 and 140 mg/dL  Periop blood loss anemia Hemoglobin stable Monitor H&H  Best Practice (right click and "Reselect all SmartList Selections" daily)   Diet/type: NPO DVT prophylaxis: SCD GI prophylaxis: PPI Lines: Central line Foley:  Yes, and it is still needed Code Status:  full code Last date of multidisciplinary goals of care discussion [per TCTS]  Labs   CBC: Recent Labs  Lab 06/26/21 1159 06/30/21 1049 06/30/21 1430 06/30/21 1506 06/30/21 1538 06/30/21 1608 06/30/21 1613  WBC 11.1*  --   --   --   --   --   --   HGB 13.4   < > 7.0* 6.8* 7.1* 8.2* 8.5*  HCT 39.0   < > 20.4* 20.0* 21.0* 24.0* 25.0*  MCV 90.7  --   --   --   --   --   --   PLT 312  --  168  --   --   --   --    < > = values in this interval not displayed.  Basic Metabolic Panel: Recent Labs  Lab 06/26/21 1159 06/30/21 1049 06/30/21 1219 06/30/21 1305 06/30/21 1327 06/30/21 1359 06/30/21 1506 06/30/21 1538 06/30/21 1608 06/30/21 1613  NA 140   < > 142 141   < > 140 139 142 142 142  K 4.2   < > 3.5 3.6   < > 4.3 4.4 3.8 3.6 3.7  CL 108   < > 106 106  --  104 104  --  107  --   CO2 21*  --   --   --   --   --   --   --   --   --   GLUCOSE 96   < > 112* 114*  --  104* 138*  --  125*  --   BUN 10   < > 13 13  --  13 11  --  10  --   CREATININE 0.71   < > 0.60 0.60  --  0.50 0.50  --  0.50  --   CALCIUM 9.1  --   --   --   --   --   --   --   --   --    <  > = values in this interval not displayed.   GFR: Estimated Creatinine Clearance: 91.2 mL/min (by C-G formula based on SCr of 0.5 mg/dL). Recent Labs  Lab 06/26/21 1159  WBC 11.1*    Liver Function Tests: Recent Labs  Lab 06/26/21 1159  AST 14*  ALT 15  ALKPHOS 82  BILITOT 0.6  PROT 7.3  ALBUMIN 4.1   No results for input(s): "LIPASE", "AMYLASE" in the last 168 hours. No results for input(s): "AMMONIA" in the last 168 hours.  ABG    Component Value Date/Time   PHART 7.342 (L) 06/30/2021 1613   PCO2ART 41.6 06/30/2021 1613   PO2ART 123 (H) 06/30/2021 1613   HCO3 22.6 06/30/2021 1613   TCO2 24 06/30/2021 1613   ACIDBASEDEF 3.0 (H) 06/30/2021 1613   O2SAT 99 06/30/2021 1613     Coagulation Profile: Recent Labs  Lab 06/26/21 1159  INR 0.9    Cardiac Enzymes: No results for input(s): "CKTOTAL", "CKMB", "CKMBINDEX", "TROPONINI" in the last 168 hours.  HbA1C: HbA1c, POC (controlled diabetic range)  Date/Time Value Ref Range Status  05/15/2021 03:19 PM 6.3 0.0 - 7.0 % Final   Hgb A1c MFr Bld  Date/Time Value Ref Range Status  06/26/2021 11:59 AM 6.8 (H) 4.8 - 5.6 % Final    Comment:    (NOTE) Pre diabetes:          5.7%-6.4%  Diabetes:              >6.4%  Glycemic control for   <7.0% adults with diabetes   05/15/2021 11:05 AM 6.6 (H) 4.8 - 5.6 % Final    Comment:             Prediabetes: 5.7 - 6.4          Diabetes: >6.4          Glycemic control for adults with diabetes: <7.0     CBG: Recent Labs  Lab 06/26/21 1040 06/30/21 0853  GLUCAP 132* 163*    Review of Systems:   Unable as patient is intubated and sedated on mechanical ventilation.   Past Medical History:  She,  has a past medical history of Anemia, Atherosclerotic heart disease of native coronary artery with angina pectoris ([place]) (05/2012, 10/2013), Bipolar depression (  [place]), Breast abscess, COPD (chronic obstructive pulmonary disease) ([place]), Depression, Diabetic peripheral neuropathy  ([place]), Fibromyalgia, Genital warts, GERD (gastroesophageal reflux disease), History of hiatal hernia, HLD (hyperlipidemia), Hypertension, Pain in limb, PTSD (post-traumatic stress disorder), Sickle cell trait ([place]), Tobacco abuse, Tooth caries, and Type II diabetes mellitus ([place]).   Surgical History:   Past Surgical History:  Procedure Laterality Date   BLADDER SURGERY  1980   "TVT"   BLADDER SURGERY  2003   "TVT"   BREAST EXCISIONAL BIOPSY     BREAST SURGERY Right    I&D for multiple abscesses   CARDIAC CATHETERIZATION     cutting balloon     EYE SURGERY Left    laser surgery   INCISE AND DRAIN ABCESS  ; 04/26/11; 08/13/11   right breast   INCISION AND DRAINAGE ABSCESS Right 05/09/2012   Procedure: INCISION AND DRAINAGE RIGHT BREAST ABSCESS;  Surgeon: Imogene Burn. Georgette Dover, MD;  Location: [facility];  Service: General;  Laterality: Right;   INCISION AND DRAINAGE ABSCESS Right 02/08/2014   Procedure: INCISION AND DRAINAGE RIGHT BREAST ABSCESS;  Surgeon: Donnie Mesa, MD;  Location: [facility];  Service: General;  Laterality: Right;   INCISION AND DRAINAGE ABSCESS Right 06/14/2014   Procedure: INCISION AND DRAINAGE RIGHT BREAST ABSCESS;  Surgeon: Donnie Mesa, MD;  Location: [facility];  Service: General;  Laterality: Right;   IRRIGATION AND DEBRIDEMENT ABSCESS  12/19/2010   Procedure: IRRIGATION AND DEBRIDEMENT ABSCESS;  Surgeon: Rolm Bookbinder, MD;  Location: [facility];  Service: General;  Laterality: Right;   IRRIGATION AND DEBRIDEMENT ABSCESS  08/13/2011   Procedure: MINOR INCISION AND DRAINAGE OF ABSCESS;  Surgeon: Gwenyth Ober, MD;  Location: [facility];  Service: General;  Laterality: Right;  Right Breast    IRRIGATION AND DEBRIDEMENT ABSCESS  11/17/2011   Procedure: IRRIGATION AND DEBRIDEMENT ABSCESS;  Surgeon: Imogene Burn. Georgette Dover, MD;  Location: [facility];  Service: General;  Laterality: Right;  irrigation and debridement right recurrent breast abscess   LARYNX SURGERY     LEFT HEART CATH AND CORONARY  ANGIOGRAPHY N/A 04/09/2021   Procedure: LEFT HEART CATH AND CORONARY ANGIOGRAPHY;  Surgeon: Jettie Booze, MD;  Location: [facility];  Service: Cardiovascular;  Laterality: N/A;   LEFT HEART CATHETERIZATION WITH CORONARY ANGIOGRAM N/A 05/29/2012   Procedure: LEFT HEART CATHETERIZATION WITH CORONARY ANGIOGRAM & PTCA;  Surgeon: Troy Sine, MD;  Location: [facility] CATH LAB;  Service: Cardiovascular; Bifurcation dRCA-RPAD/PLA & rPDA  PTCA.   LEFT HEART CATHETERIZATION WITH CORONARY ANGIOGRAM N/A 11/08/2013   Procedure: LEFT HEART CATHETERIZATION WITH CORONARY ANGIOGRAM and Coronary Stent Intervention;  Surgeon: Leonie Man, MD;  Location: [facility] CATH LAB;  Service: Cardiovascular; mLAD 80% (FFR 0.73) - resolute DES 3.0 x 22 mm (postdilated to 3.3 mm->3.5 mm)   NM MYOVIEW LTD  01/26/2009   Normal study, no evidence of ischemia, EF 67%   ployp removed from voice box 03/30/12     REFRACTIVE SURGERY  ~ 2010   right   TONSILLECTOMY  1988     Social History:   reports that she has been smoking cigarettes. She started smoking about 42 years ago. She has a 21.00 pack-year smoking history. She has never used smokeless tobacco. She reports current drug use. She reports that she does not drink alcohol.   Family History:  Her family history includes COPD in her mother; Cancer in her maternal aunt and mother; Crohn's disease in her maternal aunt; Diabetes in her maternal grandmother; Esophageal cancer  in her mother; Heart disease in her father and mother; Hyperlipidemia in her father; Hypertension in her brother and maternal grandmother; Stomach cancer in her mother. There is no history of Rectal cancer, Liver cancer, Colon cancer, or Breast cancer.   Allergies Allergies  Allergen Reactions   Amoxicillin Hives    Has patient had a PCN reaction causing immediate rash, facial/tongue/throat swelling, SOB or lightheadedness with hypotension: Yes Has patient had a PCN reaction causing severe rash  involving mucus membranes or skin necrosis: No Has patient had a PCN reaction that required hospitalization No Has patient had a PCN reaction occurring within the last 10 years: Yes If all of the above answers are "NO", then may proceed with Cephalosporin use.   Varenicline Other (See Comments)    Nightmares - hallucinations      Home Medications  Prior to Admission medications   Medication Sig Start Date End Date Taking? Authorizing Provider  amLODipine (NORVASC) 5 MG tablet Take 1 tablet (5 mg total) by mouth at bedtime. 05/15/21  Yes [name], DO  aspirin EC 81 MG tablet Take 81 mg by mouth daily.   Yes [provider]  Aspirin-Caffeine 500-32.5 MG TABS Take 2 tablets by mouth 2 (two) times daily as needed (pain).   Yes [provider]  atorvastatin (LIPITOR) 40 MG tablet TAKE 1 TABLET (40 MG TOTAL) BY MOUTH DAILY. 04/14/21  Yes Espinoza, Alejandra, DO  busPIRone (BUSPAR) 15 MG tablet TAKE 1/2 TABLET (7.5 MG TOTAL) BY MOUTH TWICE DAILY 04/14/21  Yes Espinoza, Alejandra, DO  Carboxymethylcellul-Glycerin (LUBRICATING EYE DROPS OP) Place 1 drop into both eyes daily as needed (dry eyes).   Yes [provider]  carvedilol (COREG) 12.5 MG tablet Take 1 tablet (12.5 mg total) by mouth 2 (two) times daily with a meal. 05/01/21  Yes O'Neal, Cassie Freer, MD  diphenhydramine-acetaminophen (TYLENOL PM) 25-500 MG TABS tablet Take 1 tablet by mouth at bedtime as needed (sleep).   Yes [provider]  ferrous sulfate 325 (65 FE) MG tablet Take 325 mg by mouth daily with breakfast.   Yes [provider]  fluticasone (FLONASE) 50 MCG/ACT nasal spray PLACE 1 SPRAY INTO BOTH NOSTRILS DAILY AS NEEDED (CONGESTION). 05/13/21  Yes Espinoza, Dawson Bills, DO  fluticasone-salmeterol (ADVAIR) 100-50 MCG/ACT AEPB Inhale 1 puff into the lungs 2 (two) times daily. 06/05/21  Yes Espinoza, Dawson Bills, DO  ipratropium (ATROVENT HFA) 17 MCG/ACT inhaler Inhale 2 puffs into the  lungs every 4 (four) hours as needed for wheezing (COPD).   Yes [provider]  isosorbide mononitrate (IMDUR) 30 MG 24 hr tablet Take 1 tablet (30 mg total) by mouth daily. 04/03/21 07/02/21 Yes O'Neal, Cassie Freer, MD  LINZESS 290 MCG CAPS capsule TAKE 1 CAPSULE (290 [place]) BY MOUTH DAILY BEFORE BREAKFAST. Patient taking differently: Take 290 mcg by mouth every Monday. 05/13/21  Yes Nandigam, Venia Minks, MD  losartan (COZAAR) 50 MG tablet TAKE ONE TABLET BY MOUTH ONCE DAILY 05/22/21  Yes [name], DO  nicotine polacrilex (NICORETTE) 2 MG gum Take 1 each (2 mg total) by mouth as needed for smoking cessation. 06/03/21  Yes [name], DO  nitroGLYCERIN (NITROSTAT) 0.4 MG SL tablet Place 1 tablet (0.4 mg total) under the tongue every 5 (five) minutes as needed for chest pain. 04/24/20  Yes Espinoza, Alejandra, DO  pantoprazole (PROTONIX) 40 MG tablet TAKE 1 TABLET (40 MG TOTAL) BY MOUTH DAILY. 06/29/21  Yes Espinoza, Dawson Bills, DO  polyethylene glycol powder (GLYCOLAX/MIRALAX) 17 GM/SCOOP  powder Take 17 g by mouth daily. Mix with 8-12 oz of water Patient taking differently: Take 17 g by mouth daily as needed for moderate constipation. Mix with 8-12 oz of water 07/21/20  Yes Espinoza, Alejandra, DO  sertraline (ZOLOFT) 50 MG tablet Take 1 tablet (50 mg total) by mouth daily. 05/15/21  Yes Espinoza, Dawson Bills, DO  tiZANidine (ZANAFLEX) 2 MG tablet Take 1 tablet (2 mg total) by mouth every 8 (eight) hours as needed for muscle spasms. 04/14/21  Yes Hazel Sams, PA-C  TRULICITY 0.37 CW/8.8QB SOPN INJECT 0.75 MG INTO THE SKIN ONCE A WEEK. 04/29/21  Yes [name], DO  valACYclovir (VALTREX) 1000 MG tablet TAKE FOR 3 DAYS AS NEEDED FOR EACH OUTBREAK 04/14/21  Yes [name], DO  vitamin B-12 (CYANOCOBALAMIN) 1000 MCG tablet Take 1,000 mcg by mouth daily.   Yes [provider]  ACCU-CHEK AVIVA PLUS test strip TEST BLOOD SUGAR THREE TIMES DAILY AS DIRECTED  12/21/20   [name], DO  diclofenac sodium (VOLTAREN) 1 % GEL Apply 1 application topically 3 (three) times daily as needed (pain).    [provider]  Lidocaine (HM LIDOCAINE PATCH) 4 % PTCH Apply 1 patch topically daily as needed. Patient not taking: Reported on 06/24/2021 04/14/21   Hazel Sams, PA-C     Critical care time:     Total critical care time: 38 minutes  Performed by: [place] care time was exclusive of separately billable procedures and treating other patients.   Critical care was necessary to treat or prevent imminent or life-threatening deterioration.   Critical care was time spent personally by me on the following activities: development of treatment plan with patient and/or surrogate as well as nursing, discussions with consultants, evaluation of patient's response to treatment, examination of patient, obtaining history from patient or surrogate, ordering and performing treatments and interventions, ordering and review of laboratory studies, ordering and review of radiographic studies, pulse oximetry and re-evaluation of patient's condition.   Jacky Kindle, MD [facility] See Amion for pager If no response to pager, please call [phone] until 7pm After 7pm, Please call E-link [phone]

## 2021-06-30 NOTE — Anesthesia Procedure Notes (Signed)
Central Venous Catheter Insertion Start/End6/20/2023 10:11 AM, 06/30/2021 10:19 AM Patient location: OR. Preanesthetic checklist: patient identified, IV checked, site marked, risks and benefits discussed, surgical consent, monitors and equipment checked, pre-op evaluation, timeout performed and anesthesia consent Position: supine Hand hygiene performed , maximum sterile barriers used  and Seldinger technique used Catheter size: 9 Fr Total catheter length 10. Central line was placed.MAC introducer Procedure performed using ultrasound guided technique. Ultrasound Notes:anatomy identified, needle tip was noted to be adjacent to the nerve/plexus identified, no ultrasound evidence of intravascular and/or intraneural injection and image(s) printed for medical record Attempts: 1 Following insertion, line sutured, dressing applied and Biopatch. Post procedure assessment: blood return through all ports, free fluid flow and no air  Patient tolerated the procedure well with no immediate complications.

## 2021-06-30 NOTE — Progress Notes (Signed)
RT performed recruitment maneuver due to desat.  PC 30, Peep 5, 100%, I time 3.0, rate 10 for 2 minutes. Pt. Blood pressure dropped, recruitment maneuver was stopped and pt was placed back on original vent settings. PT. Blood pressure recovered and peep was increased to 8 by DR. Chand. Sats 100%

## 2021-06-30 NOTE — Op Note (Incomplete)
[address]       [place] [address]             [phone]                                          06/30/2021 Patient:  Alice Reichert Pre-Op Dx: ***   Post-op Dx:  *** Procedure: CABG X 3.  LIMA LAD, RSVG distal RCA, R radial to OM   Endoscopic greater saphenous vein harvest on the left Right radial artery harvest   Surgeon and Role:      * Taegan Standage, Lucile Crater, MD - [place], PA-C - assisting An experienced assistant was required given the complexity of this surgery and the standard of surgical care. The assistant was needed for exposure, dissection, suctioning, retraction of delicate tissues and sutures, instrument exchange and for overall help during this procedure.    Anesthesia  general EBL:  ***ml Blood Administration: *** Xclamp Time:  83 min Pump Time:  155 min  Drains: 19 F blake drain: *** R, L, mediastinal  Wires: *** Counts: correct   Indications: *** Findings: Very small LIMA.  Small vein.  Large radial.  Good caliber LAD.  Small PDA, and calcified distal RCA.  Heavily calcified OM.  Operative Technique: All invasive lines were placed in pre-op holding.  After the risks, benefits and alternatives were thoroughly discussed, the patient was brought to the operative theatre.  Anesthesia was induced, and the patient was prepped and draped in normal sterile fashion.  An appropriate surgical pause was performed, and pre-operative antibiotics were dosed accordingly.  We began with simultaneous incisions along the *** leg for harvesting of the greater saphenous vein and the chest for the sternotomy.  In regards to the sternotomy, this was carried down with bovie cautery, and the sternum was divided with a reciprocating saw.  Meticulous hemostasis was obtained.  The left internal thoracic artery was exposed and harvested in in pedicled fashion.  The patient was systemically heparinized, and the artery was divided distally,  and placed in a papaverine sponge.    The sternal elevator was removed, and a retractor was placed.  The pericardium was divided in the midline and fashioned into a cradle with pericardial stitches.   After we confirmed an appropriate ACT, the ascending aorta was cannulated in standard fashion.  The right atrial appendage was used for venous cannulation site.  Cardiopulmonary bypass was initiated, and the heart retractor was placed. The cross clamp was applied, and a dose of anterograde cardioplegia was given with good arrest of the heart.  We moved to the posterior wall of the heart, and found a good target on the ***.  An arteriotomy was made, and the vein graft was anastomosed to it in an end to side fashion.  Next we exposed the lateral wall, and found a good target on the ***.  An end to side anastomosis with the vein graft was then created.  Next, we exposed the anterior wall of the heart and identified a good target on ***.   An arteriotomy was created.  The vein was anastomosed in an end to side fashion.  Finally, we exposed a good target on the *** LAD, and fashioned an end to side anastomosis between it and the LITA.  We began to re-warm, and  a re-animation dose of cardioplegia was given.  The heart was de-aired, and the cross clamp was removed.  Meticulous hemostasis was obtained.    A partial occludding clamp was then placed on the ascending aorta, and we created an end to side anastomosis between it and the proximal vein graft***.  Rings were placed on the proximal anastomosis.  Hemostasis was obtained, and we separated from cardiopulmonary bypass without event.  The heparin was reversed with protamine.  Chest tubes and wires were placed, and the sternum was re-approximated with sternal wires.  The soft tissue and skin were re-approximated wth absorbable suture.    The patient tolerated the procedure without any immediate complications, and was transferred to the ICU in guarded  condition.  Minie Roadcap Bary Leriche

## 2021-06-30 NOTE — Hospital Course (Addendum)
Referring: Jettie Booze, MD Primary Care: [name], DO  History of Present Illness:     56 year old female with history of coronary artery disease and diabetes melitis presents for surgical evaluation of three-vessel coronary disease that was found on an elective left heart cath.  She describes 3 episodes of anginal symptoms similar to her heart attack in 2015 where she underwent PCI to the LAD.  All of these have occurred with exertion, but she also admits to having significant anxiety about this.   Since her last progress exertional dyspnea.  She denies any more anginal symptoms.  She has preserved biventricular function.  On review of her left heart cath she has good distal targets on all walls.  She likely will require 2 grafts to the lateral wall.  She is left-handed and is potentially in agreement with the use of her right radial artery.  She will require a CABG x4 with right radial artery harvest.  Hospital Course: Ms. Bugay was taken to the operative room for elective surgery on 06/30/2021.  Three-vessel coronary bypass grafting was carried out with a left internal mammary artery to the left anterior descending coronary artery.  The left radial artery was placed to the obtuse marginal coronary artery and saphenous vein to the distal right coronary artery.  All coronary targets were severely diseased.  Following procedure, she separated from cardiopulmonary bypass without difficulty.  He was transferred to the surgical ICU in stable condition.  IV nitroglycerin was continued to optimize patency of the radial graft.  She remained stable in the ICU. She was weaned from mechanical vent support and extubated to BiPAP at around midnight on the day of surgery and by the morning of POD1 had been transitioned to a low-flow nasal cannula.  She was mobilized and diet was advanced as tolerated.  The NGT infusion was transitioned to oral amlodipine.

## 2021-06-30 NOTE — Anesthesia Procedure Notes (Signed)
Procedure Name: Intubation Date/Time: 06/30/2021 10:39 AM  Performed by: Dorthea Cove, CRNAPre-anesthesia Checklist: Patient identified, Emergency Drugs available, Suction available and Patient being monitored Patient Re-evaluated:Patient Re-evaluated prior to induction Oxygen Delivery Method: Circle system utilized Preoxygenation: Pre-oxygenation with 100% oxygen Induction Type: IV induction Ventilation: Mask ventilation without difficulty and Oral airway inserted - appropriate to patient size Laryngoscope Size: Mac and 3 Grade View: Grade I Tube type: Oral Tube size: 8.0 mm Number of attempts: 1 Airway Equipment and Method: Stylet and Oral airway Placement Confirmation: ETT inserted through vocal cords under direct vision, positive ETCO2 and breath sounds checked- equal and bilateral Secured at: 22 cm Tube secured with: Tape Dental Injury: Teeth and Oropharynx as per pre-operative assessment  Comments: DL x2 by SRNA. 1st attempt grade 1 view but unable to pass tube due to patient coughing. 2nd attempt by SRNA grade 1 view tube passed easily.

## 2021-06-30 NOTE — Transfer of Care (Addendum)
Immediate Anesthesia Transfer of Care Note  Patient: Cassidy [name]  Procedure(s) Performed: CORONARY ARTERY BYPASS GRAFTING (CABG) x3 USING LEFT INTERNAL MAMMARY ARTERY,  RIGHT RADIAL ARTERY AND LEFT GREATER SAPHENOUS VEIN (Chest) RADIAL ARTERY HARVEST (Right: Arm Lower) TRANSESOPHAGEAL ECHOCARDIOGRAM (TEE) ENDOVEIN HARVEST OF GREATER SAPHENOUS VEIN (Left)  Patient Location: ICU  Anesthesia Type:General  Level of Consciousness: Patient remains intubated per anesthesia plan  Airway & Oxygen Therapy: Patient remains intubated per anesthesia plan and Patient placed on Ventilator (see vital sign flow sheet for setting)  Post-op Assessment: Report given to RN and Post -op Vital signs reviewed and stable  Post vital signs: Reviewed and stable  Last Vitals:  Vitals Value Taken Time  BP 116/67   Temp 36.6 C 06/30/21 1702  Pulse 92 06/30/21 1702  Resp 14 06/30/21 1702  SpO2 92 % 06/30/21 1702  Vitals shown include unvalidated device data.  Last Pain:  Vitals:   06/30/21 0932  PainSc: 4          Complications: No notable events documented.

## 2021-06-30 NOTE — Anesthesia Procedure Notes (Signed)
Arterial Line Insertion Start/End6/20/2023 10:33 AM, 06/30/2021 10:39 AM  Patient location: OR. Preanesthetic checklist: patient identified, IV checked, site marked, risks and benefits discussed, surgical consent, monitors and equipment checked, pre-op evaluation, timeout performed and anesthesia consent Left, radial was placed Catheter size: 20 G Hand hygiene performed  and maximum sterile barriers used   Attempts: 1 Procedure performed without using ultrasound guided technique. Following insertion, dressing applied and Biopatch. Post procedure assessment: normal and unchanged  Post procedure complications: second provider assisted. Patient tolerated the procedure well with no immediate complications.

## 2021-06-30 NOTE — Progress Notes (Signed)
  Echocardiogram Echocardiogram Transesophageal has been performed.  Cassidy [name] 06/30/2021, 1:01 PM

## 2021-06-30 NOTE — Brief Op Note (Signed)
06/30/2021  3:07 PM  PATIENT:  Cassidy [name]  56 y.o. female  PRE-OPERATIVE DIAGNOSIS:  Coronary artery disease, unstable angina pectoris  POST-OPERATIVE DIAGNOSIS:  Coronary artery disease, unstable angina pectoris   PROCEDURE:   CORONARY ARTERY BYPASS GRAFTING x 3 USING LEFT INTERNAL MAMMARY ARTERY,  RIGHT RADIAL ARTERY AND LEFT GREATER SAPHENOUS VEIN  LIMA-LAD Rt RADIAL ARTERY - OM SVG-DISTAL RCA  RADIAL ARTERY HARVEST (Right) ENDOVEIN HARVEST OF GREATER SAPHENOUS VEIN (Left) Vein harvest time: 53min Vein prep time: 20min  TRANSESOPHAGEAL ECHOCARDIOGRAM   SURGEON:  Lajuana Matte, MD - Primary  PHYSICIAN ASSISTANT: Arend Bahl  ASSISTANTS: Gaylyn Cheers, RN, RN First Assistant   ANESTHESIA:   general  EBL:  114ml   BLOOD ADMINISTERED:none  DRAINS:  Left pleural and mediastinal tubes    LOCAL MEDICATIONS USED:  NONE  COUNTS:  Correct  DICTATION: .Dragon Dictation  PLAN OF CARE: Admit to inpatient   PATIENT DISPOSITION:  ICU - intubated and hemodynamically stable.   Delay start of Pharmacological VTE agent (>24hrs) due to surgical blood loss or risk of bleeding: yes

## 2021-07-01 ENCOUNTER — Inpatient Hospital Stay (HOSPITAL_COMMUNITY): Payer: Medicaid Other

## 2021-07-01 ENCOUNTER — Encounter (HOSPITAL_COMMUNITY): Payer: Self-pay | Admitting: Thoracic Surgery (Cardiothoracic Vascular Surgery)

## 2021-07-01 DIAGNOSIS — I2510 Atherosclerotic heart disease of native coronary artery without angina pectoris: Secondary | ICD-10-CM | POA: Diagnosis not present

## 2021-07-01 DIAGNOSIS — Z951 Presence of aortocoronary bypass graft: Secondary | ICD-10-CM | POA: Diagnosis not present

## 2021-07-01 LAB — GLUCOSE, CAPILLARY
Glucose-Capillary: 101 mg/dL — ABNORMAL HIGH (ref 70–99)
Glucose-Capillary: 108 mg/dL — ABNORMAL HIGH (ref 70–99)
Glucose-Capillary: 110 mg/dL — ABNORMAL HIGH (ref 70–99)
Glucose-Capillary: 112 mg/dL — ABNORMAL HIGH (ref 70–99)
Glucose-Capillary: 114 mg/dL — ABNORMAL HIGH (ref 70–99)
Glucose-Capillary: 118 mg/dL — ABNORMAL HIGH (ref 70–99)
Glucose-Capillary: 121 mg/dL — ABNORMAL HIGH (ref 70–99)
Glucose-Capillary: 122 mg/dL — ABNORMAL HIGH (ref 70–99)
Glucose-Capillary: 130 mg/dL — ABNORMAL HIGH (ref 70–99)
Glucose-Capillary: 132 mg/dL — ABNORMAL HIGH (ref 70–99)
Glucose-Capillary: 143 mg/dL — ABNORMAL HIGH (ref 70–99)
Glucose-Capillary: 149 mg/dL — ABNORMAL HIGH (ref 70–99)
Glucose-Capillary: 150 mg/dL — ABNORMAL HIGH (ref 70–99)
Glucose-Capillary: 150 mg/dL — ABNORMAL HIGH (ref 70–99)
Glucose-Capillary: 156 mg/dL — ABNORMAL HIGH (ref 70–99)
Glucose-Capillary: 156 mg/dL — ABNORMAL HIGH (ref 70–99)

## 2021-07-01 LAB — POCT ACTIVATED CLOTTING TIME
Activated Clotting Time: 157 seconds
Activated Clotting Time: 777 seconds
Activated Clotting Time: 946 seconds
Activated Clotting Time: 536 seconds
Activated Clotting Time: 455 seconds
Activated Clotting Time: 462 seconds
Activated Clotting Time: 126 seconds
Activated Clotting Time: 503 seconds

## 2021-07-01 LAB — BASIC METABOLIC PANEL
Anion gap: 8 (ref 5–15)
BUN: 11 mg/dL (ref 6–20)
CO2: 23 mmol/L (ref 22–32)
Calcium: 8.1 mg/dL — ABNORMAL LOW (ref 8.9–10.3)
Chloride: 109 mmol/L (ref 98–111)
Creatinine, Ser: 0.6 mg/dL (ref 0.44–1.00)
GFR, Estimated: >60 mL/min (ref >60)
Glucose, Bld: 118 mg/dL — ABNORMAL HIGH (ref 70–99)
Potassium: 4.1 mmol/L (ref 3.5–5.1)
Sodium: 140 mmol/L (ref 135–145)

## 2021-07-01 LAB — POCT I-STAT 7, (LYTES, BLD GAS, ICA,H+H)
Acid-base deficit: 1 mmol/L (ref 0.0–2.0)
Acid-base deficit: 7 mmol/L — ABNORMAL HIGH (ref 0.0–2.0)
Bicarbonate: 19.2 mmol/L — ABNORMAL LOW (ref 20.0–28.0)
Bicarbonate: 24.6 mmol/L (ref 20.0–28.0)
Calcium, Ion: 1.16 mmol/L (ref 1.15–1.40)
Calcium, Ion: 1.2 mmol/L (ref 1.15–1.40)
HCT: 25 % — ABNORMAL LOW (ref 36.0–46.0)
HCT: 26 % — ABNORMAL LOW (ref 36.0–46.0)
Hemoglobin: 8.5 g/dL — ABNORMAL LOW (ref 12.0–15.0)
Hemoglobin: 8.8 g/dL — ABNORMAL LOW (ref 12.0–15.0)
O2 Saturation: 91 %
O2 Saturation: 96 %
Patient temperature: 37
Patient temperature: 37.1
Potassium: 3.9 mmol/L (ref 3.5–5.1)
Potassium: 3.9 mmol/L (ref 3.5–5.1)
Sodium: 142 mmol/L (ref 135–145)
Sodium: 144 mmol/L (ref 135–145)
TCO2: 20 mmol/L — ABNORMAL LOW (ref 22–32)
TCO2: 26 mmol/L (ref 22–32)
pCO2 arterial: 40 mmHg (ref 32–48)
pCO2 arterial: 43.7 mmHg (ref 32–48)
pH, Arterial: 7.289 — ABNORMAL LOW (ref 7.35–7.45)
pH, Arterial: 7.359 (ref 7.35–7.45)
pO2, Arterial: 69 mmHg — ABNORMAL LOW (ref 83–108)
pO2, Arterial: 83 mmHg (ref 83–108)

## 2021-07-01 LAB — CBC
HCT: 27.5 % — ABNORMAL LOW (ref 36.0–46.0)
Hemoglobin: 9.3 g/dL — ABNORMAL LOW (ref 12.0–15.0)
MCH: 31.3 pg (ref 26.0–34.0)
MCHC: 33.8 g/dL (ref 30.0–36.0)
MCV: 92.6 fL (ref 80.0–100.0)
Platelets: 171 10*3/uL (ref 150–400)
RBC: 2.97 MIL/uL — ABNORMAL LOW (ref 3.87–5.11)
RDW: 13.2 % (ref 11.5–15.5)
WBC: 11.7 10*3/uL — ABNORMAL HIGH (ref 4.0–10.5)
nRBC: 0 % (ref 0.0–0.2)

## 2021-07-01 LAB — MAGNESIUM: Magnesium: 2.6 mg/dL — ABNORMAL HIGH (ref 1.7–2.4)

## 2021-07-01 MED ORDER — ENOXAPARIN SODIUM 40 MG/0.4ML IJ SOSY
40.0000 mg | PREFILLED_SYRINGE | Freq: Every day | INTRAMUSCULAR | Status: DC
  Administered 2021-07-01 – 2021-07-04 (×4): 40 mg via SUBCUTANEOUS
  Filled 2021-07-01 (×4): qty 0.4

## 2021-07-01 MED ORDER — SODIUM CHLORIDE 0.9 % IV SOLN: INTRAVENOUS | Status: DC | PRN

## 2021-07-01 MED ORDER — INSULIN ASPART 100 UNIT/ML IJ SOLN
0.0000 [IU] | INTRAMUSCULAR | Status: DC
  Administered 2021-07-01 (×4): 2 [IU] via SUBCUTANEOUS
  Administered 2021-07-02: 4 [IU] via SUBCUTANEOUS
  Administered 2021-07-02: 2 [IU] via SUBCUTANEOUS
  Administered 2021-07-02 (×2): 4 [IU] via SUBCUTANEOUS

## 2021-07-01 MED ORDER — SODIUM BICARBONATE 8.4 % IV SOLN
100.0000 meq | Freq: Once | INTRAVENOUS | Status: AC
  Administered 2021-07-01: 100 meq via INTRAVENOUS
  Filled 2021-07-01: qty 100

## 2021-07-01 MED ORDER — SERTRALINE HCL 50 MG PO TABS
50.0000 mg | ORAL_TABLET | Freq: Every day | ORAL | Status: DC
  Administered 2021-07-01 – 2021-07-05 (×5): 50 mg via ORAL
  Filled 2021-07-01 (×5): qty 1

## 2021-07-01 MED ORDER — CARVEDILOL 6.25 MG PO TABS
6.2500 mg | ORAL_TABLET | Freq: Two times a day (BID) | ORAL | Status: DC
  Administered 2021-07-01 – 2021-07-04 (×6): 6.25 mg via ORAL
  Filled 2021-07-01 (×7): qty 1

## 2021-07-01 MED ORDER — LIP MEDEX EX OINT
TOPICAL_OINTMENT | CUTANEOUS | Status: DC | PRN
  Filled 2021-07-01: qty 7

## 2021-07-01 MED ORDER — ENOXAPARIN SODIUM 40 MG/0.4ML IJ SOSY: 40.0000 mg | PREFILLED_SYRINGE | Freq: Every day | INTRAMUSCULAR | Status: DC

## 2021-07-01 MED ORDER — ORAL CARE MOUTH RINSE
15.0000 mL | OROMUCOSAL | Status: DC | PRN
Start: 2021-07-01 — End: 2021-07-05

## 2021-07-01 MED ORDER — AMLODIPINE BESYLATE 5 MG PO TABS
2.5000 mg | ORAL_TABLET | Freq: Every day | ORAL | Status: DC
  Administered 2021-07-01 – 2021-07-05 (×5): 2.5 mg via ORAL
  Filled 2021-07-01 (×5): qty 1

## 2021-07-01 MED ORDER — BUSPIRONE HCL 5 MG PO TABS
15.0000 mg | ORAL_TABLET | Freq: Two times a day (BID) | ORAL | Status: DC
  Administered 2021-07-01 – 2021-07-05 (×9): 15 mg via ORAL
  Filled 2021-07-01 (×5): qty 1
  Filled 2021-07-01: qty 2
  Filled 2021-07-01: qty 1
  Filled 2021-07-01 (×2): qty 2

## 2021-07-01 MED ORDER — ALBUMIN HUMAN 5 % IV SOLN
12.5000 g | Freq: Once | INTRAVENOUS | Status: AC
  Administered 2021-07-01: 12.5 g via INTRAVENOUS
  Filled 2021-07-01: qty 250

## 2021-07-01 NOTE — Progress Notes (Signed)
Pt is stable with adequate BP (Systolic in upper 10'U low 100's), in NSR, 93% SpO2 on 3L [place], and AAOx4.  Left Radial Arterial line removed at 72:53 with no complications, foley catheter removed at 09:16.  Pt ambulated in hallway at 11:00 ~63ft, tolerated fair, no BP or HR changes. Pt sitting comfortably in chair after return from walk, sister is visiting at bedside.

## 2021-07-01 NOTE — Progress Notes (Signed)
CT surgery PM rounds  Doing well postop day 1 maintaining sinus rhythm Ambulated in hallway Continue current care  Blood pressure 98/65, pulse 81, temperature 98.2 F (36.8 C), temperature source Oral, resp. rate 15, height 5\' 7"  (1.702 m), weight 100.2 kg, last menstrual period 06/12/2016, SpO2 94 %.

## 2021-07-01 NOTE — Procedures (Signed)
Extubation Procedure Note  Patient Details:   Name: Cassidy [name] DOB: 1965/10/24 MRN: [id number]   Airway Documentation:    Vent end date: 07/01/21 Vent end time: 0040   Evaluation  O2 sats: stable throughout Complications: No apparent complications Patient did tolerate procedure well. Bilateral Breath Sounds: Clear, Diminished   Yes  Pt extubated to Bipap for MD order.  NIF -22 [place] 719ml. Audible cuff leak, Pt able to say her name, no stridor noted.   Clance Boll 07/01/2021, 12:46 AM

## 2021-07-01 NOTE — Progress Notes (Addendum)
[facility] DAILY ICU PROGRESS NOTE                   [address]            [place] [address]          [phone]   1 Day Post-Op Procedure(s) (LRB): CORONARY ARTERY BYPASS GRAFTING (CABG) x3 USING LEFT INTERNAL MAMMARY ARTERY,  RIGHT RADIAL ARTERY AND LEFT GREATER SAPHENOUS VEIN (N/A) RADIAL ARTERY HARVEST (Right) TRANSESOPHAGEAL ECHOCARDIOGRAM (TEE) (N/A) ENDOVEIN HARVEST OF GREATER SAPHENOUS VEIN (Left)  Total Length of Stay:  LOS: 1 day   Subjective: Extubated to BiPAP around midnight and just now transitioned to nasal cannula.  Awake and alert, primary complaint is neck and back discomfort which are chronic.    Objective: Vital signs in last 24 hours: Temp:  [97.9 F (36.6 C)-99.7 F (37.6 C)] 99.7 F (37.6 C) (06/21 0804) Pulse Rate:  [64-93] 85 (06/21 0804) Resp:  [0-24] 16 (06/21 0804) BP: (77-127)/(49-69) 127/49 (06/21 0804) SpO2:  [90 %-100 %] 93 % (06/21 0804) Arterial Line BP: (83-133)/(48-82) 127/56 (06/21 0700) FiO2 (%):  [40 %-50 %] 40 % (06/21 0400) Weight:  [93.9 kg-100.2 kg] 100.2 kg (06/21 0404)  Filed Weights   06/30/21 0849 07/01/21 0404  Weight: 93.9 kg 100.2 kg    Weight change:    Hemodynamic parameters for last 24 hours: CVP:  [0 mmHg-16 mmHg] 2 mmHg  Intake/Output from previous day: 06/20 0701 - 06/21 0700 In: 4494.4 [I.V.:2263.2; Blood:220; IV Piggyback:2011.3] Out: 8469 [Urine:4330; Blood:400; Chest Tube:320]  Intake/Output this shift: No intake/output data recorded.  Current Meds: Scheduled Meds:  acetaminophen  1,000 mg Oral Q6H   aspirin EC  325 mg Oral Daily   atorvastatin  40 mg Oral Daily   bisacodyl  10 mg Oral Daily   Or   bisacodyl  10 mg Rectal Daily   Chlorhexidine Gluconate Cloth  6 each Topical Daily   docusate sodium  200 mg Oral Daily   metoprolol tartrate  12.5 mg Oral BID   [START ON 07/02/2021] pantoprazole  40 mg Oral Daily   sodium chloride flush  10-40 mL Intracatheter Q12H   sodium chloride  flush  3 mL Intravenous Q12H   Continuous Infusions:  albumin human 60 mL/hr at 07/01/21 0700    ceFAZolin (ANCEF) IV Stopped (07/01/21 0333)   dexmedetomidine (PRECEDEX) IV infusion Stopped (06/30/21 2049)   insulin 1.1 Units/hr (07/01/21 0700)   lactated ringers 20 mL/hr at 07/01/21 0700   nitroGLYCERIN     nitroGLYCERIN 10 mcg/min (07/01/21 0700)   norepinephrine (LEVOPHED) Adult infusion 0 mcg/min (06/30/21 1655)   PRN Meds:.albumin human, dextrose, metoprolol tartrate, morphine injection, ondansetron (ZOFRAN) IV, mouth rinse, oxyCODONE, polyvinyl alcohol, sodium chloride flush, sodium chloride flush, traMADol  General appearance: alert, cooperative, and mild distress Neurologic: intact Heart: RRR, monitor sjowing NSR with no significant arrhythmias. Lungs: breath sounds are clear anterior. CT drainage 359ml since surgery (122ml past 12 hours) Abdomen: soft, NT Extremities: all warm and well perfused. Right hand without any deficits.  Wound: the sternotomy incision and LLE EVH incisions are covered with dry dressings.   Lab Results: CBC: Recent Labs    06/30/21 2240 07/01/21 0004 07/01/21 0137 07/01/21 0255  WBC 12.9*  --   --  11.7*  HGB 9.7*   < > 8.5* 9.3*  HCT 27.8*   < > 25.0* 27.5*  PLT 170  --   --  171   < > =  values in this interval not displayed.   BMET:  Recent Labs    06/30/21 2240 07/01/21 0004 07/01/21 0137 07/01/21 0255  NA 140   < > 144 140  K 4.5   < > 3.9 4.1  CL 113*  --   --  109  CO2 22  --   --  23  GLUCOSE 132*  --   --  118*  BUN 12  --   --  11  CREATININE 0.62  --   --  0.60  CALCIUM 7.9*  --   --  8.1*   < > = values in this interval not displayed.    CMET: Lab Results  Component Value Date   WBC 11.7 (H) 07/01/2021   HGB 9.3 (L) 07/01/2021   HCT 27.5 (L) 07/01/2021   PLT 171 07/01/2021   GLUCOSE 118 (H) 07/01/2021   CHOL 131 05/15/2021   TRIG 77 05/15/2021   HDL 60 05/15/2021   LDLCALC 56 05/15/2021   ALT 14 06/30/2021    AST 25 06/30/2021   NA 140 07/01/2021   K 4.1 07/01/2021   CL 109 07/01/2021   CREATININE 0.60 07/01/2021   BUN 11 07/01/2021   CO2 23 07/01/2021   TSH 1.374 11/06/2013   INR 1.4 (H) 06/30/2021   HGBA1C 6.8 (H) 06/26/2021   MICROALBUR 0.50 05/03/2008      PT/INR:  Recent Labs    06/30/21 1709  LABPROT 16.9*  INR 1.4*   Radiology: DG Chest Port 1 View  Result Date: 06/30/2021 CLINICAL DATA:  Postop CABG EXAM: PORTABLE CHEST 1 VIEW COMPARISON:  06/26/2021, 01/15/2016 FINDINGS: Interval intubation, tip of the endotracheal tube about 3.3 cm superior to the carina. Esophageal tube tip below the diaphragm. Right IJ central venous catheter over the SVC confluence. Interval placement of multiple chest and mediastinal drainage catheters. Interval post sternotomy changes. Borderline cardiomegaly with mild central congestion. No visible pneumothorax. Possible cavitary nodule in the right upper lobe again noted IMPRESSION: 1. Interval placement of support lines and tubes with interval sternotomy as above. 2. Borderline to mild cardiomegaly with slight central congestion 3. Possible cavitary nodule in the right upper lobe as noted previously, this may be evaluated with dedicated chest CT when clinically feasible. Electronically Signed   By: Donavan Foil M.D.   On: 06/30/2021 17:33     Assessment/Plan: S/P Procedure(s) (LRB): CORONARY ARTERY BYPASS GRAFTING (CABG) x3 USING LEFT INTERNAL MAMMARY ARTERY,  RIGHT RADIAL ARTERY AND LEFT GREATER SAPHENOUS VEIN (N/A) RADIAL ARTERY HARVEST (Right) TRANSESOPHAGEAL ECHOCARDIOGRAM (TEE) (N/A) ENDOVEIN HARVEST OF GREATER SAPHENOUS VEIN (Left)  -POD1 CABG x 3 for MVCAD presenting with recurrent angina pectoris, preserved biventricular function. Requiring no inotropic or vasopressor support. Switch NTG infusion to oral amlodipine for the radial artery graft. ASA, statin, BB.  Mobilize.   -PULM- oxygenating well on [place] O2, CXR stable. Work on Education officer, environmental. Radiologist mentioned possible RUL cavitary lesion and recommended dedicated CT for evaluation when   feasible.   -ENDO- h/o type 2 DM trerated with once weekly Trulicity at home. Good control since surgery. Transition to SSI.   -GI- abd benign, advance diet as tolerated  -NEURO- intact.  -H/O anxiety and depression / bipolar disorder- resume her Zoloft and buspar.   -DVT PPX- add daily enoxaparin this PM.    Antony Odea, PA-C [phone] 07/01/2021 8:22 AM   Agree with above Doing well POD 1 progression  Esta Carmon O Runette Scifres

## 2021-07-01 NOTE — Progress Notes (Signed)
Pt taken off bipap and placed on 3L [place] at this time. Pt c/o dry mouth and dried secretions. Pt with inspiratory wheeze. Pt to be started on IS. RT will closely monitor and adjust treatment plan as needed.

## 2021-07-01 NOTE — TOC Progression Note (Signed)
Transition of Care [facility]) - Progression Note    Patient Details  Name: Cassidy [name] MRN: [id number] Date of Birth: July 06, 1965  Transition of Care [facility]) CM/SW Contact  Zenon Mayo, RN Phone Number: 07/01/2021, 10:57 AM  Clinical Narrative:    From home , POD 1 for CABG, conts with chest tube.  TOC will continue to follow for dc needs.         Expected Discharge Plan and Services                                                 Social Determinants of Health (SDOH) Interventions    Readmission Risk Interventions     No data to display

## 2021-07-01 NOTE — Progress Notes (Signed)
NAME:  Cassidy [name], MRN:  [id number], DOB:  20-Mar-1965, LOS: 1 ADMISSION DATE:  06/30/2021, CONSULTATION DATE:  06/30/21 REFERRING MD:  Dr. Kipp Brood, CHIEF COMPLAINT:  CABG   History of Present Illness:   56 year old female with PMH as below found recently on elective LHC to have severe three vessel disease after having progressive exertional anginal symptoms and progressive exertional dyspnea.  Presented 6/20 for CABG x 3 (LIMA-LAD, Rt radial artery- OM, and SVG-Distal RCA).  Surgery without complication.  PCCM consulted for medical management while in ICU post-operatively.   Pertinent  Medical History  Tobacco abuse, COPD, CAD with prior PCI and stent, DMT2, HTN, HLD, GERD, diabetic neuropathy, depression, bipolar, anemia   Significant Hospital Events: Including procedures, antibiotic start and stop dates in addition to other pertinent events   6/20 CABG x 3  Interim History / Subjective:  Patient was successfully extubated per rapid weaning protocol last night Required BiPAP overnight, currently on 3 L oxygen via nasal cannula Complaining of soreness around surgical area  Objective   Blood pressure (!) 90/59, pulse 87, temperature 99.9 F (37.7 C), resp. rate 17, height 5\' 7"  (1.702 m), weight 100.2 kg, last menstrual period 06/12/2016, SpO2 92 %. CVP:  [0 mmHg-16 mmHg] 0 mmHg  Vent Mode: BIPAP FiO2 (%):  [40 %-50 %] 40 % Set Rate:  [4 bmp-12 bmp] 12 bmp Vt Set:  [490 mL] 490 mL PEEP:  [5 cmH20] 5 cmH20 Pressure Support:  [10 cmH20] 10 cmH20 Plateau Pressure:  [18 cmH20-20 cmH20] 20 cmH20   Intake/Output Summary (Last 24 hours) at 07/01/2021 1015 Last data filed at 07/01/2021 1013 Gross per 24 hour  Intake 4818.6 ml  Output 5275 ml  Net -456.4 ml   Filed Weights   06/30/21 0849 07/01/21 0404  Weight: 93.9 kg 100.2 kg   Examination:  Physical exam: General: Acute on chronically ill-appearing female, lying on the bed HEENT: [place]/AT, eyes anicteric.  moist mucus  membranes Neuro: Alert, awake following commands Chest: Chest tubes placed on left side, coarse breath sounds, no wheezes or rhonchi Heart: Regular rate and rhythm, no murmurs or gallops Abdomen: Soft, nontender, nondistended, bowel sounds present Skin: No rash   Resolved Hospital Problem list    Assessment & Plan:  CAD s/p three-vessel CABG with right radial artery harvest  Continue aspirin and statin Chest tube output was 300 cc overnight Chest tube management TCTS Continue pain control with tramadol, oxycodone and fentanyl Patient be started on amlodipine Stop nitroglycerin infusion  Acute respiratory insufficiency, postop COPD, not in exacerbation Tobacco abuse  Patient was extubated per rapid weaning protocol Currently on 3 L oxygen via nasal cannula Required BiPAP overnight Continue to encourage incentive spirometry, titrate oxygen with O2 sat goal 92% Pulmonary hygiene Sedation was stopped Continue as needed bronchodilator We will hold off on nicotine patch for now, she may need it tomorrow to prevent nicotine withdrawal  Right upper lobe cavitary lesion X-ray chest was suggestive of right upper lobe lesion, will get CT chest during this admission  DMT2, controlled Hemoglobin A1c 6.8 Patient is on Trulicity once a week at home Insulin infusion was transitioned to sliding scale with CBG goal 140-180  Periop blood loss anemia Hemoglobin stable Monitor H&H  Anxiety disorder/depression Continue BuSpar and Zoloft  Best Practice (right click and "Reselect all SmartList Selections" daily)   Diet/type: Advance diet as tolerated DVT prophylaxis: Subcu Lovenox GI prophylaxis: PPI Lines: Central line Foley:  Yes, and it is still  needed Code Status:  full code Last date of multidisciplinary goals of care discussion [per TCTS]  Labs   CBC: Recent Labs  Lab 06/26/21 1159 06/30/21 1049 06/30/21 1430 06/30/21 1506 06/30/21 1709 06/30/21 1710 06/30/21 2240  07/01/21 0004 07/01/21 0137 07/01/21 0255  WBC 11.1*  --   --   --  13.6*  --  12.9*  --   --  11.7*  HGB 13.4   < > 7.0*   < > 9.3* 9.2* 9.7* 8.8* 8.5* 9.3*  HCT 39.0   < > 20.4*   < > 26.7* 27.0* 27.8* 26.0* 25.0* 27.5*  MCV 90.7  --   --   --  91.8  --  92.1  --   --  92.6  PLT 312  --  168  --  150  --  170  --   --  171   < > = values in this interval not displayed.    Basic Metabolic Panel: Recent Labs  Lab 06/26/21 1159 06/30/21 1049 06/30/21 1359 06/30/21 1506 06/30/21 1538 06/30/21 1608 06/30/21 1613 06/30/21 1710 06/30/21 2240 07/01/21 0004 07/01/21 0137 07/01/21 0255  NA 140   < > 140 139   < > 142   < > 141 140 142 144 140  K 4.2   < > 4.3 4.4   < > 3.6   < > 3.7 4.5 3.9 3.9 4.1  CL 108   < > 104 104  --  107  --   --  113*  --   --  109  CO2 21*  --   --   --   --   --   --   --  22  --   --  23  GLUCOSE 96   < > 104* 138*  --  125*  --   --  132*  --   --  118*  BUN 10   < > 13 11  --  10  --   --  12  --   --  11  CREATININE 0.71   < > 0.50 0.50  --  0.50  --   --  0.62  --   --  0.60  CALCIUM 9.1  --   --   --   --   --   --   --  7.9*  --   --  8.1*  MG  --   --   --   --   --   --   --   --  3.0*  --   --  2.6*   < > = values in this interval not displayed.   GFR: Estimated Creatinine Clearance: 94.3 mL/min (by C-G formula based on SCr of 0.6 mg/dL). Recent Labs  Lab 06/26/21 1159 06/30/21 1709 06/30/21 2240 07/01/21 0255  WBC 11.1* 13.6* 12.9* 11.7*    Liver Function Tests: Recent Labs  Lab 06/26/21 1159 06/30/21 2240  AST 14* 25  ALT 15 14  ALKPHOS 82 29*  BILITOT 0.6 0.9  PROT 7.3 5.6*  ALBUMIN 4.1 3.8   No results for input(s): "LIPASE", "AMYLASE" in the last 168 hours. No results for input(s): "AMMONIA" in the last 168 hours.  ABG    Component Value Date/Time   PHART 7.359 07/01/2021 0137   PCO2ART 43.7 07/01/2021 0137   PO2ART 83 07/01/2021 0137   HCO3 24.6 07/01/2021 0137   TCO2 26 07/01/2021 0137   ACIDBASEDEF 1.0  07/01/2021  [place] 07/01/2021 0137     Coagulation Profile: Recent Labs  Lab 06/26/21 1159 06/30/21 1709  INR 0.9 1.4*    Cardiac Enzymes: No results for input(s): "CKTOTAL", "CKMB", "CKMBINDEX", "TROPONINI" in the last 168 hours.  HbA1C: HbA1c, POC (controlled diabetic range)  Date/Time Value Ref Range Status  05/15/2021 03:19 PM 6.3 0.0 - 7.0 % Final   Hgb A1c MFr Bld  Date/Time Value Ref Range Status  06/26/2021 11:59 AM 6.8 (H) 4.8 - 5.6 % Final    Comment:    (NOTE) Pre diabetes:          5.7%-6.4%  Diabetes:              >6.4%  Glycemic control for   <7.0% adults with diabetes   05/15/2021 11:05 AM 6.6 (H) 4.8 - 5.6 % Final    Comment:             Prediabetes: 5.7 - 6.4          Diabetes: >6.4          Glycemic control for adults with diabetes: <7.0     CBG: Recent Labs  Lab 07/01/21 0450 07/01/21 0645 07/01/21 0801 07/01/21 0902 07/01/21 1006  GLUCAP 114* 112* 101* 132* 121*       Jacky Kindle, MD [facility] See Amion for pager If no response to pager, please call [phone] until 7pm After 7pm, Please call E-link [phone]

## 2021-07-01 NOTE — Anesthesia Postprocedure Evaluation (Addendum)
Anesthesia Post Note  Patient: Cassidy [name]  Procedure(s) Performed: CORONARY ARTERY BYPASS GRAFTING (CABG) x3 USING LEFT INTERNAL MAMMARY ARTERY,  RIGHT RADIAL ARTERY AND LEFT GREATER SAPHENOUS VEIN (Chest) RADIAL ARTERY HARVEST (Right: Arm Lower) TRANSESOPHAGEAL ECHOCARDIOGRAM (TEE) ENDOVEIN HARVEST OF GREATER SAPHENOUS VEIN (Left)     Patient location during evaluation: ICU Anesthesia Type: General Level of consciousness: sedated Pain management: pain level controlled Vital Signs Assessment: post-procedure vital signs reviewed and stable Respiratory status: respiratory function stable and patient remains intubated per anesthesia plan Cardiovascular status: stable Postop Assessment: no apparent nausea or vomiting Anesthetic complications: no   No notable events documented.  Last Vitals:  Vitals:   07/01/21 1716 07/01/21 1800  BP: 97/66 98/65  Pulse: 82 81  Resp:  15  Temp:    SpO2:  94%    Last Pain:  Vitals:   07/01/21 1747  TempSrc:   PainSc: 4                  Chaquita Basques

## 2021-07-02 ENCOUNTER — Inpatient Hospital Stay (HOSPITAL_COMMUNITY): Payer: Medicaid Other

## 2021-07-02 DIAGNOSIS — Z951 Presence of aortocoronary bypass graft: Secondary | ICD-10-CM | POA: Diagnosis not present

## 2021-07-02 DIAGNOSIS — I2510 Atherosclerotic heart disease of native coronary artery without angina pectoris: Secondary | ICD-10-CM | POA: Diagnosis not present

## 2021-07-02 LAB — CBC
HCT: 25.2 % — ABNORMAL LOW (ref 36.0–46.0)
Hemoglobin: 8.2 g/dL — ABNORMAL LOW (ref 12.0–15.0)
MCH: 31.2 pg (ref 26.0–34.0)
MCHC: 32.5 g/dL (ref 30.0–36.0)
MCV: 95.8 fL (ref 80.0–100.0)
Platelets: 171 10*3/uL (ref 150–400)
RBC: 2.63 MIL/uL — ABNORMAL LOW (ref 3.87–5.11)
RDW: 13.4 % (ref 11.5–15.5)
WBC: 10.7 10*3/uL — ABNORMAL HIGH (ref 4.0–10.5)
nRBC: 0 % (ref 0.0–0.2)

## 2021-07-02 LAB — GLUCOSE, CAPILLARY
Glucose-Capillary: 88 mg/dL (ref 70–99)
Glucose-Capillary: 160 mg/dL — ABNORMAL HIGH (ref 70–99)
Glucose-Capillary: 193 mg/dL — ABNORMAL HIGH (ref 70–99)
Glucose-Capillary: 161 mg/dL — ABNORMAL HIGH (ref 70–99)
Glucose-Capillary: 165 mg/dL — ABNORMAL HIGH (ref 70–99)

## 2021-07-02 LAB — BASIC METABOLIC PANEL
Anion gap: 9 (ref 5–15)
BUN: 9 mg/dL (ref 6–20)
CO2: 23 mmol/L (ref 22–32)
Calcium: 7.9 mg/dL — ABNORMAL LOW (ref 8.9–10.3)
Chloride: 105 mmol/L (ref 98–111)
Creatinine, Ser: 0.67 mg/dL (ref 0.44–1.00)
GFR, Estimated: >60 mL/min (ref >60)
Glucose, Bld: 102 mg/dL — ABNORMAL HIGH (ref 70–99)
Potassium: 3.8 mmol/L (ref 3.5–5.1)
Sodium: 137 mmol/L (ref 135–145)

## 2021-07-02 MED ORDER — ~~LOC~~ CARDIAC SURGERY, PATIENT & FAMILY EDUCATION: Freq: Once | Status: AC

## 2021-07-02 MED ORDER — ALBUTEROL SULFATE (2.5 MG/3ML) 0.083% IN NEBU: 2.5000 mg | INHALATION_SOLUTION | Freq: Four times a day (QID) | RESPIRATORY_TRACT | Status: DC | PRN

## 2021-07-02 MED ORDER — SODIUM CHLORIDE 0.9% FLUSH: 3.0000 mL | INTRAVENOUS | Status: DC | PRN

## 2021-07-02 MED ORDER — METHOCARBAMOL 500 MG PO TABS
500.0000 mg | ORAL_TABLET | Freq: Three times a day (TID) | ORAL | Status: AC
  Administered 2021-07-02 – 2021-07-04 (×9): 500 mg via ORAL
  Filled 2021-07-02 (×9): qty 1

## 2021-07-02 MED ORDER — SODIUM CHLORIDE 0.9 % IV SOLN: 250.0000 mL | INTRAVENOUS | Status: DC | PRN

## 2021-07-02 MED ORDER — SODIUM CHLORIDE 0.9% FLUSH
3.0000 mL | Freq: Two times a day (BID) | INTRAVENOUS | Status: DC
  Administered 2021-07-02 (×2): 3 mL via INTRAVENOUS

## 2021-07-02 MED FILL — Calcium Chloride Inj 10%: INTRAVENOUS | Qty: 10 | Status: AC

## 2021-07-02 MED FILL — Mannitol IV Soln 20%: INTRAVENOUS | Qty: 500 | Status: AC

## 2021-07-02 MED FILL — Albumin, Human Inj 5%: INTRAVENOUS | Qty: 250 | Status: AC

## 2021-07-02 MED FILL — Heparin Sodium (Porcine) Inj 1000 Unit/ML: INTRAMUSCULAR | Qty: 20 | Status: AC

## 2021-07-02 MED FILL — Electrolyte-R (PH 7.4) Solution: INTRAVENOUS | Qty: 6000 | Status: AC

## 2021-07-02 MED FILL — Sodium Bicarbonate IV Soln 8.4%: INTRAVENOUS | Qty: 50 | Status: AC

## 2021-07-02 NOTE — Progress Notes (Signed)
      [address]       [place] [address]             [phone]                 2 Days Post-Op Procedure(s) (LRB): CORONARY ARTERY BYPASS GRAFTING (CABG) x3 USING LEFT INTERNAL MAMMARY ARTERY,  RIGHT RADIAL ARTERY AND LEFT GREATER SAPHENOUS VEIN (N/A) RADIAL ARTERY HARVEST (Right) TRANSESOPHAGEAL ECHOCARDIOGRAM (TEE) (N/A) ENDOVEIN HARVEST OF GREATER SAPHENOUS VEIN (Left)   Events: No events Complains of pain _______________________________________________________________ Vitals: BP 104/66   Pulse 77   Temp 98.2 F (36.8 C) (Oral)   Resp 11   Ht 5\' 7"  (1.702 m)   Wt 100.2 kg   LMP 06/12/2016 (Approximate)   SpO2 96%   BMI 34.60 kg/m  Filed Weights   06/30/21 0849 07/01/21 0404  Weight: 93.9 kg 100.2 kg     - Neuro: alert NAD  - Cardiovascular: sinus  Drips: none.      - Pulm: EWOB    ABG    Component Value Date/Time   PHART 7.359 07/01/2021 0137   PCO2ART 43.7 07/01/2021 0137   PO2ART 83 07/01/2021 0137   HCO3 24.6 07/01/2021 0137   TCO2 26 07/01/2021 0137   ACIDBASEDEF 1.0 07/01/2021 0137   O2SAT 96 07/01/2021 0137    - Abd: ND - Extremity: warm  .Intake/Output      06/21 0701 06/22 0700 06/22 0701 06/23 0700   P.Cassidy. 480    I.V. (mL/kg) 585.4 (5.8)    Blood     IV Piggyback 549.9    Total Intake(mL/kg) 1615.3 (16.1)    Urine (mL/kg/hr) 1035 (0.4)    Blood     Chest Tube 190    Total Output 1225    Net +390.3            _______________________________________________________________ Labs:    Latest Ref Rng & Units 07/02/2021    3:25 AM 07/01/2021    2:55 AM 07/01/2021    1:37 AM  CBC  WBC 4.0 - 10.5 K/uL 10.7  11.7    Hemoglobin 12.0 - 15.0 g/dL 8.2  9.3  8.5   Hematocrit 36.0 - 46.0 % 25.2  27.5  25.0   Platelets 150 - 400 K/uL 171  171        Latest Ref Rng & Units 07/02/2021    3:25 AM 07/01/2021    2:55 AM 07/01/2021    1:37 AM  CMP  Glucose 70 - 99 mg/dL 102  118    BUN 6 - 20 mg/dL 9  11     Creatinine 0.44 - 1.00 mg/dL 0.67  0.60    Sodium 135 - 145 mmol/L 137  140  144   Potassium 3.5 - 5.1 mmol/L 3.8  4.1  3.9   Chloride 98 - 111 mmol/L 105  109    CO2 22 - 32 mmol/L 23  23    Calcium 8.9 - 10.3 mg/dL 7.9  8.1      CXR: PV congestion, big gastric bubble  _______________________________________________________________  Assessment and Plan: POD 2 s/p CABG, R Radial  Neuro: adding more pain meds CV: on amlodipine for radial, on A/S/BB Pulm: will remove CTs Renal: creat stable.  Holding diuresis for BP GI: on diet Heme: stable ID: [email or web address] Endo: SSI Dispo: floor today   Cassidy [name] Cassidy [name] 07/02/2021 8:03 AM

## 2021-07-02 NOTE — Progress Notes (Signed)
07/02/2021 1710 Received tx from [facility].  A&O, no C/O voiced.  Tele monitor applied and CCMD notified.  CHG bath given.  Oriented to room, call light and bed.  Call bell in reach.  Family at bedside. [place]

## 2021-07-02 NOTE — Progress Notes (Signed)
   NAME:  Cassidy [name], MRN:  [id number], DOB:  [birth year], LOS: 2 ADMISSION DATE:  06/30/2021, CONSULTATION DATE:  06/30/21 REFERRING MD:  Dr. Kipp Brood, CHIEF COMPLAINT:  CABG   History of Present Illness:   56 year old female with PMH as below found recently on elective LHC to have severe three vessel disease after having progressive exertional anginal symptoms and progressive exertional dyspnea.  Presented 6/20 for CABG x 3 (LIMA-LAD, Rt radial artery- OM, and SVG-Distal RCA).  Surgery without complication.  PCCM consulted for medical management while in ICU post-operatively.   Pertinent  Medical History  Tobacco abuse, COPD, CAD with prior PCI and stent, DMT2, HTN, HLD, GERD, diabetic neuropathy, depression, bipolar, anemia   Significant Hospital Events: Including procedures, antibiotic start and stop dates in addition to other pertinent events   6/20 CABG x 3 6/21 extubated 6/22 transfer order placed   Interim History / Subjective:  No distress. Awaiting transfer to floor   Objective   Blood pressure 104/66, pulse 77, temperature 98.2 F (36.8 C), temperature source Oral, resp. rate 11, height 5\' 7"  (1.702 m), weight 100.2 kg, last menstrual period 06/12/2016, SpO2 96 %.        Intake/Output Summary (Last 24 hours) at 07/02/2021 0920 Last data filed at 07/02/2021 0900 Gross per 24 hour  Intake 1135.85 ml  Output 1000 ml  Net 135.85 ml   Filed Weights   06/30/21 0849 07/01/21 0404  Weight: 93.9 kg 100.2 kg   Examination: General sitting up in chair no distress HENT NCAT no JVD  Pulm fine basilar crackles. No accessory use  Card rrr Abd soft  Ext warm dry  Neuro intact   Resolved Hospital Problem list    Assessment & Plan:  CAD s/p three-vessel CABG with right radial artery harvest  Plan Post-op per CVTS  COPD, not in exacerbation Tobacco abuse  Plan BDs   Right upper lobe cavitary lesion X-ray chest was suggestive of right upper lobe lesion There was  actually RUL changes back in 2018 Plan CT chest (CVTS will arrange)   DMT2, controlled Hemoglobin A1c 6.8 Patient is on Trulicity once a week at home Plan ssi   Periop blood loss anemia No evidence of bleeding Plan F/u intermittent CBC  Anxiety disorder/depression Plan Buspar and zoloft   Best Practice (right click and "Reselect all SmartList Selections" daily)  Per primary    Pccm s/o Call PRN  Erick Colace ACNP-BC [place] Pager # [phone] OR # [phone] if no answer

## 2021-07-02 NOTE — Progress Notes (Signed)
CARDIAC REHAB PHASE I   PRE:  Rate/Rhythm: 83 NSR  BP:  Sitting: 112/75      SaO2: 98 5L  MODE:  Ambulation: 740 ft   POST:  Rate/Rhythm: 94 NSR  BP:  Sitting: 109/64      SaO2: 94 5L   Seen pt from 1115-1310, pt minimally assisted through hallway with EVA, pt has a slow steady gait. Pt did take two breaks for SOB and chest soreness during ambulation but did not complain of leg pain while walking. Pt was returned to room and nurse was notified.  Christen Bame  12:03 PM 07/02/2021

## 2021-07-03 LAB — GLUCOSE, CAPILLARY
Glucose-Capillary: 131 mg/dL — ABNORMAL HIGH (ref 70–99)
Glucose-Capillary: 156 mg/dL — ABNORMAL HIGH (ref 70–99)
Glucose-Capillary: 164 mg/dL — ABNORMAL HIGH (ref 70–99)
Glucose-Capillary: 224 mg/dL — ABNORMAL HIGH (ref 70–99)

## 2021-07-03 LAB — CBC
HCT: 25.9 % — ABNORMAL LOW (ref 36.0–46.0)
Hemoglobin: 8.5 g/dL — ABNORMAL LOW (ref 12.0–15.0)
MCH: 30.6 pg (ref 26.0–34.0)
MCHC: 32.8 g/dL (ref 30.0–36.0)
MCV: 93.2 fL (ref 80.0–100.0)
Platelets: 200 10*3/uL (ref 150–400)
RBC: 2.78 MIL/uL — ABNORMAL LOW (ref 3.87–5.11)
RDW: 12.9 % (ref 11.5–15.5)
WBC: 9.9 10*3/uL (ref 4.0–10.5)
nRBC: 0 % (ref 0.0–0.2)

## 2021-07-03 LAB — BASIC METABOLIC PANEL
Anion gap: 4 — ABNORMAL LOW (ref 5–15)
BUN: 11 mg/dL (ref 6–20)
CO2: 28 mmol/L (ref 22–32)
Calcium: 8.3 mg/dL — ABNORMAL LOW (ref 8.9–10.3)
Chloride: 105 mmol/L (ref 98–111)
Creatinine, Ser: 0.77 mg/dL (ref 0.44–1.00)
GFR, Estimated: >60 mL/min (ref >60)
Glucose, Bld: 150 mg/dL — ABNORMAL HIGH (ref 70–99)
Potassium: 4 mmol/L (ref 3.5–5.1)
Sodium: 137 mmol/L (ref 135–145)

## 2021-07-03 MED ORDER — LACTULOSE 10 GM/15ML PO SOLN
20.0000 g | Freq: Every day | ORAL | Status: DC | PRN
  Administered 2021-07-03: 20 g via ORAL
  Filled 2021-07-03: qty 30

## 2021-07-03 MED ORDER — INSULIN ASPART 100 UNIT/ML IJ SOLN: 0.0000 [IU] | Freq: Three times a day (TID) | INTRAMUSCULAR | Status: DC

## 2021-07-03 MED ORDER — INSULIN ASPART 100 UNIT/ML IJ SOLN
0.0000 [IU] | Freq: Three times a day (TID) | INTRAMUSCULAR | Status: DC
  Administered 2021-07-03: 5 [IU] via SUBCUTANEOUS
  Administered 2021-07-03: 3 [IU] via SUBCUTANEOUS
  Administered 2021-07-04: 5 [IU] via SUBCUTANEOUS
  Administered 2021-07-04: 3 [IU] via SUBCUTANEOUS
  Administered 2021-07-04: 5 [IU] via SUBCUTANEOUS
  Administered 2021-07-05: 3 [IU] via SUBCUTANEOUS

## 2021-07-03 MED ORDER — POLYETHYLENE GLYCOL 3350 17 G PO PACK: 17.0000 g | PACK | Freq: Every day | ORAL | Status: DC | PRN

## 2021-07-03 MED FILL — Heparin Sodium (Porcine) Inj 1000 Unit/ML: Qty: 1000 | Status: AC

## 2021-07-03 MED FILL — Potassium Chloride Inj 2 mEq/ML: INTRAVENOUS | Qty: 40 | Status: AC

## 2021-07-03 MED FILL — Magnesium Sulfate Inj 50%: INTRAMUSCULAR | Qty: 10 | Status: AC

## 2021-07-03 NOTE — Discharge Summary (Signed)
Physician Discharge Summary  Patient ID: Cassidy [name] MRN: [id number] DOB/AGE: [birth year] 56 y.o.  Admit date: 06/30/2021 Discharge date: 07/05/2021  Admission Diagnoses: Patient Active Problem List   Diagnosis Date Noted   S/P CABG x 3 06/30/2021   Shortness of breath 06/03/2021   Depressed mood 05/15/2021   Abdominal pain 07/21/2020   Type 2 diabetes mellitus without complications ([place]) 81/85/6314   Back pain 06/11/2020   Anxiety 05/22/2020   Angina pectoris ([place]) 05/22/2020   Blood clotting disorder ([place]) 06/21/2019   Atherosclerotic heart disease of native coronary artery with other forms of angina pectoris ([place]) 06/21/2019   Obstructive chronic bronchitis with exacerbation (H[facility])    Hyperlipidemia with target LDL less than 70    Fibromyalgia    PTSD (post-traumatic stress disorder)    CAD S/P percutaneous coronary angioplasty 11/02/2013   Leiomyoma of uterus, unspecified 07/20/2012   Essential hypertension 05/26/2012   Tobacco abuse 05/26/2012   Unstable angina pectoris ([place]) 05/26/2012   CARPAL TUNNEL SYNDROME, LEFT 12/18/2009   TINEA VERSICOLOR 04/06/2007   BIPOLAR I D/O MOST RECENT EPIS DEPRESSED MOD 11/24/2006   HERPES, GENITAL NEC 09/30/2006   SUBSTANCE ABUSE 09/30/2006   GERD 09/30/2006   PLANTAR FASCIITIS 09/30/2006     Discharge Diagnoses:   Multivessel coronary artery disease Angina pectoris S/P CABG x 3 Hypertension Type 2 diabetes mellitus Expected acute blood loss anemia   Discharged Condition: stable  History of Present Illness:     56 year old female with history of coronary artery disease and diabetes melitis presents for surgical evaluation of three-vessel coronary disease that was found on an elective left heart cath.  She describes 3 episodes of anginal symptoms similar to her heart attack in 2015 where she underwent PCI to the LAD.  All of these have occurred with exertion, but she also admits to having significant anxiety about this.    Since her last progress exertional dyspnea.  She denies any more anginal symptoms.  She has preserved biventricular function.  On review of her left heart cath she has good distal targets on all walls.  She likely will require 2 grafts to the lateral wall.  She is left-handed and is potentially in agreement with the use of her right radial artery.  She will require a CABG x4 with right radial artery harvest.  Hospital Course: [name] was taken to the operative room for elective surgery on 06/30/2021.  Three-vessel coronary bypass grafting was carried out with a left internal mammary artery to the left anterior descending coronary artery.  The left radial artery was placed to the obtuse marginal coronary artery and saphenous vein to the distal right coronary artery.  All coronary targets were severely diseased.  Following procedure, she separated from cardiopulmonary bypass without difficulty.  He was transferred to the surgical ICU in stable condition.  IV nitroglycerin was continued to optimize patency of the radial graft.  She remained stable in the ICU. She was weaned from mechanical vent support and extubated to BiPAP at around midnight on the day of surgery and by the morning of POD1 had been transitioned to a low-flow nasal cannula.  She was mobilized and diet was advanced as tolerated.  The NTG infusion was transitioned to oral amlodipine.  Vital signs and cardiac rhythm remained stable.  She was transferred to 4E Progressive Care on postop day 2.  Progress continued.  Oxygen was weaned per protocol.  Diet and activity were advanced and well-tolerated.  She remained in a sinus  rhythm with occasional PVCs.  Pacer wires were removed routinely without complication.  She quickly regained independence with her mobility.  Hematocrit was 27% at the time of discharge and trending up.  Incisions were healing with no sign of complication.  She continued to have some mild peripheral edema and will continue on  Lasix for 5 days following discharge.  Consults: pulmonary/intensive care  Significant Diagnostic Studies: CLINICAL DATA:  CABG x3 postop 2 days   EXAM: PORTABLE CHEST 1 VIEW   COMPARISON:  Radiograph 1 day prior   FINDINGS: The right IJ vascular catheter terminating in the mid SVC is unchanged. Median sternotomy wires are unchanged. Presumed bilateral chest tubes remain in place terminating on the right near the right heart border. The left chest tube tip is off the field of view.   The cardiomediastinal silhouette is stable   There is asymmetric elevation of the right hemidiaphragm. Linear opacities in the lung bases most likely reflect atelectasis. Again seen is nodular opacity in the right upper lobe measuring up to 2.1 cm suspicious for a parenchymal nodule. There is no significant pleural effusion. There is no pneumothorax.   There is no acute osseous abnormality.   IMPRESSION: 1. No significant interval change in lung aeration since the study from 1 day prior. 2. Persistent nodular opacity in the right upper lobe for which chest CT is again recommended when feasible.     Electronically Signed   By: Valetta Mole M.D.   On: 07/02/2021 08:09  Treatments: Surgery  06/30/2021 Patient:  Cassidy [name] Pre-Op Dx: unstable angina 3V CAD HTN HLP COPD DM   Post-op Dx:  same Procedure: CABG X 3.  LIMA LAD, RSVG distal RCA, R radial to OM   Endoscopic greater saphenous vein harvest on the left Right radial artery harvest     Surgeon and Role:      Lajuana Matte, MD - Primary    Macarthur Critchley, PA-C - assisting    Anesthesia  general EBL:  1000 ml Blood Administration: none Xclamp Time:  83 min Pump Time:  155 min   Drains: 15 F blake drain: R, L, mediastinal  Wires: none Counts: correct     Indications: 56 year old female with severe three-vessel coronary artery disease and preserved biventricular function.  On review of her left heart cath  she has good distal targets on all walls.  She likely will require 2 grafts to the lateral wall.  She is left-handed and is potentially in agreement with the use of her right radial artery.  She will require a CABG x4 with right radial artery harvest.   Findings: Very small LIMA.  Small vein.  Large radial.  Good caliber LAD.  Small PDA, and calcified distal RCA.  Heavily calcified OM.  Discharge Exam: Blood pressure (!) 145/79, pulse 86, temperature 98 F (36.7 C), temperature source Oral, resp. rate 20, height 5\' 7"  (1.702 m), weight 98 kg, last menstrual period 06/12/2016, SpO2 96 %.  General appearance: alert, cooperative, and no distress Neurologic: intact Heart: SR with no significant arrhythmias  lungs: breath sounds clear, no on RA Abdomen: soft, NT. Extremities: all well perfused, right arm incision is intact and dry. No deficits in right hand.  Wound: mild swelling a top of the sternal incision. Wound is dry.   Disposition: Discharged to home in stable condition.  Discharge Instructions     Amb Referral to Cardiac Rehabilitation   Complete by: As directed    Diagnosis: CABG  CABG X ___: 3   After initial evaluation and assessments completed: Virtual Based Care may be provided alone or in conjunction with Phase 2 Cardiac Rehab based on patient barriers.: Yes      Allergies as of 07/05/2021       Reactions   Amoxicillin Hives   Has patient had a PCN reaction causing immediate rash, facial/tongue/throat swelling, SOB or lightheadedness with hypotension: Yes Has patient had a PCN reaction causing severe rash involving mucus membranes or skin necrosis: No Has patient had a PCN reaction that required hospitalization No Has patient had a PCN reaction occurring within the last 10 years: Yes If all of the above answers are "NO", then may proceed with Cephalosporin use.   Varenicline Other (See Comments)   Nightmares - hallucinations         Medication List     STOP  taking these medications    isosorbide mononitrate 30 MG 24 hr tablet Commonly known as: IMDUR       TAKE these medications    Accu-Chek Aviva Plus test strip Generic drug: glucose blood TEST BLOOD SUGAR THREE TIMES DAILY AS DIRECTED   amLODipine 5 MG tablet Commonly known as: NORVASC Take 1 tablet (5 mg total) by mouth at bedtime.   aspirin EC 81 MG tablet Take 81 mg by mouth daily.   Aspirin-Caffeine 500-32.5 MG Tabs Take 2 tablets by mouth 2 (two) times daily as needed (pain).   atorvastatin 40 MG tablet Commonly known as: LIPITOR TAKE 1 TABLET (40 MG TOTAL) BY MOUTH DAILY.   busPIRone 15 MG tablet Commonly known as: BUSPAR TAKE 1/2 TABLET (7.5 MG TOTAL) BY MOUTH TWICE DAILY   carvedilol 12.5 MG tablet Commonly known as: COREG Take 1 tablet (12.5 mg total) by mouth 2 (two) times daily with a meal.   diclofenac sodium 1 % Gel Commonly known as: VOLTAREN Apply 1 application topically 3 (three) times daily as needed (pain).   diphenhydramine-acetaminophen 25-500 MG Tabs tablet Commonly known as: TYLENOL PM Take 1 tablet by mouth at bedtime as needed (sleep).   ferrous sulfate 325 (65 FE) MG tablet Take 325 mg by mouth daily with breakfast.   fluticasone 50 MCG/ACT nasal spray Commonly known as: FLONASE PLACE 1 SPRAY INTO BOTH NOSTRILS DAILY AS NEEDED (CONGESTION).   fluticasone-salmeterol 100-50 MCG/ACT Aepb Commonly known as: ADVAIR Inhale 1 puff into the lungs 2 (two) times daily.   furosemide 40 MG tablet Commonly known as: Lasix Take 1 tablet (40 mg total) by mouth daily for 5 days.   ipratropium 17 MCG/ACT inhaler Commonly known as: ATROVENT HFA Inhale 2 puffs into the lungs every 4 (four) hours as needed for wheezing (COPD).   Linzess 290 MCG Caps capsule Generic drug: linaclotide TAKE 1 CAPSULE (290 MCG TOTAL) BY MOUTH DAILY BEFORE BREAKFAST. What changed: See the new instructions.   losartan 50 MG tablet Commonly known as: COZAAR TAKE ONE  TABLET BY MOUTH ONCE DAILY   LUBRICATING EYE DROPS OP Place 1 drop into both eyes daily as needed (dry eyes).   nicotine polacrilex 2 MG gum Commonly known as: NICORETTE Take 1 each (2 mg total) by mouth as needed for smoking cessation.   nitroGLYCERIN 0.4 MG SL tablet Commonly known as: NITROSTAT Place 1 tablet (0.4 mg total) under the tongue every 5 (five) minutes as needed for chest pain.   pantoprazole 40 MG tablet Commonly known as: PROTONIX TAKE 1 TABLET (40 MG TOTAL) BY MOUTH DAILY.   polyethylene glycol  powder 17 GM/SCOOP powder Commonly known as: GLYCOLAX/MIRALAX Take 17 g by mouth daily. Mix with 8-12 oz of water What changed:  when to take this reasons to take this   potassium chloride SA 20 MEQ tablet Commonly known as: KLOR-CON M Take 1 tablet (20 mEq total) by mouth daily for 5 days.   sertraline 50 MG tablet Commonly known as: ZOLOFT Take 1 tablet (50 mg total) by mouth daily.   tiZANidine 2 MG tablet Commonly known as: ZANAFLEX Take 1 tablet (2 mg total) by mouth every 8 (eight) hours as needed for muscle spasms.   traMADol 50 MG tablet Commonly known as: ULTRAM Take 1 tablet (50 mg total) by mouth every 4 (four) hours as needed for up to 7 days for moderate pain.   Trulicity 9.56 OZ/3.0QM Sopn Generic drug: Dulaglutide INJECT 0.75 MG INTO THE SKIN ONCE A WEEK.   valACYclovir 1000 MG tablet Commonly known as: VALTREX TAKE FOR 3 DAYS AS NEEDED FOR EACH OUTBREAK   vitamin B-12 1000 MCG tablet Commonly known as: CYANOCOBALAMIN Take 1,000 mcg by mouth daily.        Follow-up Information     Lajuana Matte, MD Follow up on 07/10/2021.   Specialty: Cardiothoracic Surgery Why: Your appointment is at 2:10 PM. This is a virtual visit.  Do not go to the office.  Dr. Kipp Brood will call you at around 2:10 PM. Contact information: [place] [address] [phone]         Monge, [place], NP. Go on 07/22/2021.    Specialties: Nurse Practitioner, Family Medicine Why: Your appointment is at 10:05 AM. Contact information: [address] [place] [address] [phone]                The patient has been discharged on:   1.Beta Blocker:  Yes [ x  ]                              No   [   ]                              If No, reason:  2.Ace Inhibitor/ARB: Yes [ x  ]                                     No  [    ]                                     If No, reason:  3.Statin:   Yes [ x  ]                  No  [   ]                  If No, reason:  4.Ecasa:  Yes  [ x  ]                  No   [   ]                  If No, reason:   Signed: Antony Odea, PA-C 07/05/2021, 9:04 AM

## 2021-07-03 NOTE — Evaluation (Signed)
Physical Therapy Evaluation & Discharge Patient Details Name: Cassidy [name] MRN: [id number] DOB: November 27, 1965 Today's Date: 07/03/2021  History of Present Illness  Pt is a 56 y.o. female admitted 06/30/21 for planned CABGx3. PMH includes DM2, COPD, CAD, HTN, neuropathy, tobacco use, fibromyalgia, PTSD, depression.   Clinical Impression  Patient evaluated by Physical Therapy with no further acute PT needs identified. PTA, pt independent, lives alone; will have necessary support from family at home. Today, pt trialled gait training with RW, rollator and no DME, ultimately requiring no DME for stability; pt independent with mobility and ADL tasks. Reviewed educ re: sternal precautions, activity recommendations, importance of mobility. Pt reports sx a wake-up call and expresses motivation for lifestyle changes. All education has been completed and the patient has no further questions. Acute PT is signing off. Thank you for this referral.    SpO2 95% on RA, HR 105 with ambulation    Recommendations for follow up therapy are one component of a multi-disciplinary discharge planning process, led by the attending physician.  Recommendations may be updated based on patient status, additional functional criteria and insurance authorization.  Follow Up Recommendations No PT follow up      Assistance Recommended at Discharge PRN  Patient can return home with the following  Assist for transportation;Assistance with cooking/housework    Equipment Recommendations None recommended by PT  Recommendations for Other Services   N/A   Functional Status Assessment  N/A    Precautions / Restrictions Precautions Precautions: Sternal      Mobility  Bed Mobility Overal bed mobility: Modified Independent                  Transfers Overall transfer level: Independent Equipment used: None                    Ambulation/Gait Ambulation/Gait assistance: Independent Gait Distance (Feet):  400 Feet Assistive device: Rolling walker (2 wheels), Rollator (4 wheels), None Gait Pattern/deviations: Step-through pattern, Decreased stride length Gait velocity: Decreased     General Gait Details: slow, steady gait initially with RW, then trialled rollator, progressing to no DME; pt indep, no overt instability or LOB with ambulation  Stairs Stairs: Yes Stairs assistance: Supervision Stair Management: One rail Left, Alternating pattern, Step to pattern Number of Stairs: 10 General stair comments: ascend 10 stairs with alternating pattern, descend with step-to pattern, single UE support on rail; supervision for safety; pt notes slight SOB, conversational during stair training  Wheelchair Mobility    Modified Rankin (Stroke Patients Only)       Balance Overall balance assessment: No apparent balance deficits (not formally assessed)   Sitting balance-Leahy Scale: Good       Standing balance-Leahy Scale: Good                               Pertinent Vitals/Pain Pain Assessment Pain Assessment: Faces Faces Pain Scale: Hurts a little bit Pain Location: sternal incision Pain Descriptors / Indicators: Sore Pain Intervention(s): Monitored during session    Home Living Family/patient expects to be discharged to:: Private residence Living Arrangements: Alone Available Help at Discharge: Family;Available 24 hours/day Type of Home: House Home Access: Stairs to enter Entrance Stairs-Rails: Doctor, general practice of Steps: 2+4   Home Layout: One level Home Equipment: None Additional Comments: sons plan to provide initial assist as needed    Prior Function Prior Level of Function : Independent/Modified Independent  Mobility Comments: Independent without DME       Hand Dominance        Extremity/Trunk Assessment   Upper Extremity Assessment Upper Extremity Assessment: Overall WFL for tasks assessed    Lower Extremity  Assessment Lower Extremity Assessment: Overall WFL for tasks assessed       Communication   Communication: No difficulties  Cognition Arousal/Alertness: Awake/alert Behavior During Therapy: WFL for tasks assessed/performed Overall Cognitive Status: Within Functional Limits for tasks assessed                                          General Comments General comments (skin integrity, edema, etc.): educ re: sternal precautions, activity recommendations (specifically walking program), incentive spirometer use, lifestyle changes, importance of mobility. pt reports motivation for lifestyle/habit changes ("they've already cleaned up my house and gotten rid of all that stuff")    Exercises Other Exercises Other Exercises: incentive spirometer x5, pt pulling 1250-158mL   Assessment/Plan    PT Assessment Patient does not need any further PT services  PT Problem List         PT Treatment Interventions      PT Goals (Current goals can be found in the Care Plan section)  Acute Rehab PT Goals PT Goal Formulation: All assessment and education complete, DC therapy    Frequency       Co-evaluation               AM-PAC PT "6 Clicks" Mobility  Outcome Measure Help needed turning from your back to your side while in a flat bed without using bedrails?: None Help needed moving from lying on your back to sitting on the side of a flat bed without using bedrails?: None Help needed moving to and from a bed to a chair (including a wheelchair)?: None Help needed standing up from a chair using your arms (e.g., wheelchair or bedside chair)?: None Help needed to walk in hospital room?: None Help needed climbing 3-5 steps with a railing? : A Little 6 Click Score: 23    End of Session   Activity Tolerance: Patient tolerated treatment well Patient left: in bed;with call bell/phone within reach Nurse Communication: Mobility status PT Visit Diagnosis: Other abnormalities of  gait and mobility (R26.89)    Time: 3295-1884 PT Time Calculation (min) (ACUTE ONLY): 19 min   Charges:   PT Evaluation $PT Eval Moderate Complexity: 1 Mod        Ina Homes, PT, DPT Acute Rehabilitation Services  Pager [phone] Office [phone]  Malachy Chamber 07/03/2021, 4:01 PM

## 2021-07-04 LAB — GLUCOSE, CAPILLARY
Glucose-Capillary: 199 mg/dL — ABNORMAL HIGH (ref 70–99)
Glucose-Capillary: 175 mg/dL — ABNORMAL HIGH (ref 70–99)
Glucose-Capillary: 201 mg/dL — ABNORMAL HIGH (ref 70–99)
Glucose-Capillary: 211 mg/dL — ABNORMAL HIGH (ref 70–99)
Glucose-Capillary: 201 mg/dL — ABNORMAL HIGH (ref 70–99)

## 2021-07-04 MED ORDER — CARVEDILOL 12.5 MG PO TABS
12.5000 mg | ORAL_TABLET | Freq: Two times a day (BID) | ORAL | Status: DC
  Administered 2021-07-04 – 2021-07-05 (×2): 12.5 mg via ORAL
  Filled 2021-07-04 (×2): qty 1

## 2021-07-04 NOTE — Progress Notes (Signed)
[id number] Cardiac Rehab Pt states that she has walked independently this morning and will walk herself later.Reviewed discharge education with pt. She has great recall of education that was provided yesterday and states she has a plan for smoking cessation.She has already called the 1-800 quit line and has a Psychologist, occupational.Pt voices that she is ready for change and wants to make it happen.

## 2021-07-05 LAB — BASIC METABOLIC PANEL
Anion gap: 8 (ref 5–15)
BUN: 10 mg/dL (ref 6–20)
CO2: 27 mmol/L (ref 22–32)
Calcium: 8.9 mg/dL (ref 8.9–10.3)
Chloride: 104 mmol/L (ref 98–111)
Creatinine, Ser: 0.67 mg/dL (ref 0.44–1.00)
GFR, Estimated: >60 mL/min (ref >60)
Glucose, Bld: 198 mg/dL — ABNORMAL HIGH (ref 70–99)
Potassium: 3.4 mmol/L — ABNORMAL LOW (ref 3.5–5.1)
Sodium: 139 mmol/L (ref 135–145)

## 2021-07-05 LAB — CBC
HCT: 27.4 % — ABNORMAL LOW (ref 36.0–46.0)
Hemoglobin: 9.5 g/dL — ABNORMAL LOW (ref 12.0–15.0)
MCH: 31.1 pg (ref 26.0–34.0)
MCHC: 34.7 g/dL (ref 30.0–36.0)
MCV: 89.8 fL (ref 80.0–100.0)
Platelets: 285 10*3/uL (ref 150–400)
RBC: 3.05 MIL/uL — ABNORMAL LOW (ref 3.87–5.11)
RDW: 12.6 % (ref 11.5–15.5)
WBC: 9.9 10*3/uL (ref 4.0–10.5)
nRBC: 0.2 % (ref 0.0–0.2)

## 2021-07-05 LAB — GLUCOSE, CAPILLARY: Glucose-Capillary: 180 mg/dL — ABNORMAL HIGH (ref 70–99)

## 2021-07-05 MED ORDER — POTASSIUM CHLORIDE CRYS ER 20 MEQ PO TBCR: 20.0000 meq | EXTENDED_RELEASE_TABLET | Freq: Every day | ORAL | 0 refills | Status: DC

## 2021-07-05 MED ORDER — TRAMADOL HCL 50 MG PO TABS
50.0000 mg | ORAL_TABLET | ORAL | 0 refills | Status: DC | PRN
Start: 2021-07-05 — End: 2021-07-10

## 2021-07-05 MED ORDER — FUROSEMIDE 40 MG PO TABS: 40.0000 mg | ORAL_TABLET | Freq: Every day | ORAL | 0 refills | Status: DC

## 2021-07-07 ENCOUNTER — Other Ambulatory Visit: Payer: Self-pay | Admitting: Family Medicine

## 2021-07-07 ENCOUNTER — Telehealth: Payer: Self-pay

## 2021-07-07 MED ORDER — NYSTATIN 100000 UNIT/ML MT SUSP: 5.0000 mL | Freq: Four times a day (QID) | OROMUCOSAL | 0 refills | Status: AC

## 2021-07-07 NOTE — Telephone Encounter (Signed)
I have sent in Nystatin solution to use QID for 7 days.

## 2021-07-07 NOTE — Progress Notes (Signed)
Nystatin suspension sent in for reported thrush.

## 2021-07-07 NOTE — Telephone Encounter (Signed)
Patient calls nurse line and LVM asking for treatment of oral thrush. Attempted to call patient to gather more information and schedule appointment.   Patient did not answer. LVM for patient to return call to office.   Veronda Prude, RN

## 2021-07-10 ENCOUNTER — Ambulatory Visit (INDEPENDENT_AMBULATORY_CARE_PROVIDER_SITE_OTHER): Payer: Self-pay | Admitting: Thoracic Surgery (Cardiothoracic Vascular Surgery)

## 2021-07-10 ENCOUNTER — Other Ambulatory Visit: Payer: Self-pay | Admitting: Family Medicine

## 2021-07-10 DIAGNOSIS — Z951 Presence of aortocoronary bypass graft: Secondary | ICD-10-CM

## 2021-07-10 DIAGNOSIS — Z794 Long term (current) use of insulin: Secondary | ICD-10-CM

## 2021-07-10 LAB — ECHO INTRAOPERATIVE TEE
AR max vel: 3.54 cm2
AV Area VTI: 3.22 cm2
AV Area mean vel: 3.16 cm2
AV Mean grad: 2 mmHg
AV Peak grad: 3.5 mmHg
Ao pk vel: 0.94 m/s
Height: 67 in
Weight: 3312 oz

## 2021-07-10 MED ORDER — TRAMADOL HCL 50 MG PO TABS: 50.0000 mg | ORAL_TABLET | ORAL | 0 refills | Status: AC | PRN

## 2021-07-10 NOTE — Addendum Note (Signed)
Addended by: Lajuana Matte on: 07/10/2021 02:12 PM   Modules accepted: Orders

## 2021-07-10 NOTE — Progress Notes (Signed)
     [address]       [place] [address]             [phone]       Patient: Home Provider: Office Consent for Telemedicine visit obtained.  Today's visit was completed via a real-time telehealth (see specific modality noted below). The patient/authorized person provided oral consent at the time of the visit to engage in a telemedicine encounter with the present provider at [facility]. The patient/authorized person was informed of the potential benefits, limitations, and risks of telemedicine. The patient/authorized person expressed understanding that the laws that protect confidentiality also apply to telemedicine. The patient/authorized person acknowledged understanding that telemedicine does not provide emergency services and that he or she would need to call 911 or proceed to the nearest hospital for help if such a need arose.   Total time spent in the clinical discussion 10 minutes.  Telehealth Modality: Phone visit (audio only)  I had a telephone visit with  Alice Reichert who is s/p CABG.  Overall doing well.   Pain is minimal.  Ambulating well. Vitals have been stable.  SATSUKI ZILLMER will see [place] back in 1 month with a chest x-ray for cardiac rehab clearance.  Korinna Tat Bary Leriche

## 2021-07-17 ENCOUNTER — Other Ambulatory Visit: Payer: Self-pay | Admitting: *Deleted

## 2021-07-17 ENCOUNTER — Ambulatory Visit (INDEPENDENT_AMBULATORY_CARE_PROVIDER_SITE_OTHER): Payer: Self-pay | Admitting: Physician Assistant

## 2021-07-17 VITALS — BP 120/71 | HR 76 | Temp 97.5°F | Resp 20 | Ht 67.0 in | Wt 210.0 lb

## 2021-07-17 DIAGNOSIS — I2510 Atherosclerotic heart disease of native coronary artery without angina pectoris: Secondary | ICD-10-CM

## 2021-07-17 DIAGNOSIS — Z951 Presence of aortocoronary bypass graft: Secondary | ICD-10-CM

## 2021-07-17 MED ORDER — DOXYCYCLINE HYCLATE 100 MG PO TABS: 100.0000 mg | ORAL_TABLET | Freq: Two times a day (BID) | ORAL | 0 refills | Status: DC

## 2021-07-17 MED ORDER — POTASSIUM CHLORIDE CRYS ER 20 MEQ PO TBCR: 20.0000 meq | EXTENDED_RELEASE_TABLET | Freq: Every day | ORAL | 1 refills | Status: DC | PRN

## 2021-07-17 MED ORDER — SULFAMETHOXAZOLE-TRIMETHOPRIM 800-160 MG PO TABS: 1.0000 | ORAL_TABLET | Freq: Two times a day (BID) | ORAL | 0 refills | Status: DC

## 2021-07-17 MED ORDER — FUROSEMIDE 40 MG PO TABS
40.0000 mg | ORAL_TABLET | Freq: Every day | ORAL | 1 refills | Status: DC | PRN
Start: 2021-07-17 — End: 2022-11-22

## 2021-07-17 NOTE — Patient Instructions (Signed)
Wash incisions daily with soap and water daily  Take lasix for the next 5 days, then change to as needed

## 2021-07-17 NOTE — Progress Notes (Signed)
[address]       [place] [address]             [phone]    HPI:  Patient is S/P CABG performed by Dr. Kipp Brood on 06/30/2021.  She contacted our office today concerning LE swelling and a painful bump behind her knee.  She sent photos to our office and we felt she should be seen.  The patient has been painting her incision with betadine since discharge.  Her RUE is painful to touch and she has some erythema along the incision in her RUE.  She also notes some swelling long her Left ankle and a painful "bump" in the back of her knee.  She denies fevers, chills.   Current Outpatient Medications  Medication Sig Dispense Refill   ACCU-CHEK AVIVA PLUS test strip TEST BLOOD SUGAR THREE TIMES DAILY AS DIRECTED 100 each 2   aspirin EC 81 MG tablet Take 81 mg by mouth daily.     Aspirin-Caffeine 500-32.5 MG TABS Take 2 tablets by mouth 2 (two) times daily as needed (pain).     atorvastatin (LIPITOR) 40 MG tablet TAKE 1 TABLET (40 MG TOTAL) BY MOUTH DAILY. 90 tablet 3   busPIRone (BUSPAR) 15 MG tablet TAKE 1/2 TABLET (7.5 MG TOTAL) BY MOUTH TWICE DAILY 30 tablet 11   Carboxymethylcellul-Glycerin (LUBRICATING EYE DROPS OP) Place 1 drop into both eyes daily as needed (dry eyes).     carvedilol (COREG) 12.5 MG tablet Take 1 tablet (12.5 mg total) by mouth 2 (two) times daily with a meal. 180 tablet 1   diclofenac sodium (VOLTAREN) 1 % GEL Apply 1 application topically 3 (three) times daily as needed (pain).     diphenhydramine-acetaminophen (TYLENOL PM) 25-500 MG TABS tablet Take 1 tablet by mouth at bedtime as needed (sleep).     doxycycline (VIBRA-TABS) 100 MG tablet Take 1 tablet (100 mg total) by mouth 2 (two) times daily. 20 tablet 0   ferrous sulfate 325 (65 FE) MG tablet Take 325 mg by mouth daily with breakfast.     fluticasone (FLONASE) 50 MCG/ACT nasal spray PLACE 1 SPRAY INTO BOTH NOSTRILS DAILY AS NEEDED (CONGESTION). 16 g 1   fluticasone-salmeterol (ADVAIR) 100-50  MCG/ACT AEPB Inhale 1 puff into the lungs 2 (two) times daily. 1 each 3   ipratropium (ATROVENT HFA) 17 MCG/ACT inhaler Inhale 2 puffs into the lungs every 4 (four) hours as needed for wheezing (COPD).     LINZESS 290 MCG CAPS capsule TAKE 1 CAPSULE (290 MCG TOTAL) BY MOUTH DAILY BEFORE BREAKFAST. (Patient taking differently: Take 290 mcg by mouth every Monday.) 30 capsule 3   losartan (COZAAR) 50 MG tablet TAKE ONE TABLET BY MOUTH ONCE DAILY 90 tablet 3   nicotine polacrilex (NICORETTE) 2 MG gum Take 1 each (2 mg total) by mouth as needed for smoking cessation. 100 tablet 0   nitroGLYCERIN (NITROSTAT) 0.4 MG SL tablet Place 1 tablet (0.4 mg total) under the tongue every 5 (five) minutes as needed for chest pain. 25 tablet 5   pantoprazole (PROTONIX) 40 MG tablet TAKE 1 TABLET (40 MG TOTAL) BY MOUTH DAILY. 30 tablet 1   polyethylene glycol powder (GLYCOLAX/MIRALAX) 17 GM/SCOOP powder Take 17 g by mouth daily. Mix with 8-12 oz of water (Patient taking differently: Take 17 g by mouth daily as needed for moderate constipation. Mix with 8-12 oz of water) 3350 g 1   sertraline (ZOLOFT) 50 MG tablet Take 1  tablet (50 mg total) by mouth daily. 30 tablet 3   sulfamethoxazole-trimethoprim (BACTRIM DS) 800-160 MG tablet Take 1 tablet by mouth 2 (two) times daily. 6 tablet 0   tiZANidine (ZANAFLEX) 2 MG tablet Take 1 tablet (2 mg total) by mouth every 8 (eight) hours as needed for muscle spasms. 21 tablet 0   traMADol (ULTRAM) 50 MG tablet Take 1 tablet (50 mg total) by mouth every 4 (four) hours as needed for up to 7 days for moderate pain. 50 tablet 0   TRULICITY 7.54 GB/2.0FE SOPN INJECT 0.75 MG INTO THE SKIN ONCE A WEEK. 2 mL 0   valACYclovir (VALTREX) 1000 MG tablet TAKE FOR 3 DAYS AS NEEDED FOR EACH OUTBREAK 30 tablet 11   vitamin B-12 (CYANOCOBALAMIN) 1000 MCG tablet Take 1,000 mcg by mouth daily.     amLODipine (NORVASC) 5 MG tablet Take 1 tablet (5 mg total) by mouth at bedtime. (Patient not taking:  Reported on 07/17/2021) 90 tablet 3   furosemide (LASIX) 40 MG tablet Take 1 tablet (40 mg total) by mouth daily as needed. If you gain 3 lbs in 24 hours or 5 lbs in 1 week 30 tablet 1   potassium chloride SA (KLOR-CON M) 20 MEQ tablet Take 1 tablet (20 mEq total) by mouth daily as needed. On days you take Lasix 30 tablet 1   No current facility-administered medications for this visit.    Physical Exam:  BP 120/71   Pulse 76   Temp (!) 97.5 F (36.4 C) (Oral)   Resp 20   Ht 5\' 7"  (1.702 m)   Wt 210 lb (95.3 kg)   LMP 06/12/2016 (Approximate)   SpO2 98% Comment: RA  BMI 32.89 kg/m   Sternal incision is healing w/o evidence of infection  Right Radial artery site- erythema and tender to tough from middle of incision to her hand, consistent with cellulitis  Left Ankle swelling- is bruising from [facility], this should improve with time  Seroma lateral to [facility] site- this was aspirated with removal of 10 cc of serous fluid   A/P:  RUE Cellulitis at Radial artery harvest site- patient is PCN allergic, will give Doxycycline 100 mg BID x 10 days LLE seroma- drained without difficulty Mild LE edema- will give lasix 40 mg x 5 days, supplement potassium then transition to PRN regimen Patient complains of UTI, has taken AZO w/o relief, will give RX for bactrim DS RTC next Friday for wound check  Ellwood Handler, PA-C [facility] [phone]

## 2021-07-22 ENCOUNTER — Encounter: Payer: Self-pay | Admitting: Nurse Practitioner

## 2021-07-22 ENCOUNTER — Ambulatory Visit (INDEPENDENT_AMBULATORY_CARE_PROVIDER_SITE_OTHER): Payer: Medicaid Other | Admitting: Nurse Practitioner

## 2021-07-22 VITALS — BP 115/52 | HR 88 | Ht 67.0 in | Wt 206.8 lb

## 2021-07-22 DIAGNOSIS — I10 Essential (primary) hypertension: Secondary | ICD-10-CM

## 2021-07-22 DIAGNOSIS — Z720 Tobacco use: Secondary | ICD-10-CM

## 2021-07-22 DIAGNOSIS — E785 Hyperlipidemia, unspecified: Secondary | ICD-10-CM | POA: Diagnosis not present

## 2021-07-22 DIAGNOSIS — I2510 Atherosclerotic heart disease of native coronary artery without angina pectoris: Secondary | ICD-10-CM | POA: Diagnosis not present

## 2021-07-22 DIAGNOSIS — E119 Type 2 diabetes mellitus without complications: Secondary | ICD-10-CM | POA: Diagnosis not present

## 2021-07-22 NOTE — Patient Instructions (Signed)
Medication Instructions:  Stop Losartan as directed.   *If you need a refill on your cardiac medications before your next appointment, please call your pharmacy*   Lab Work: NONE ordered at this time of appointment   If you have labs (blood work) drawn today and your tests are completely normal, you will receive your results only by: [place] (if you have MyChart) OR A paper copy in the mail If you have any lab test that is abnormal or we need to change your treatment, we will call you to review the results.   Testing/Procedures: NONE ordered at this time of appointment     Follow-Up: At [facility], you and your health needs are our priority.  As part of our continuing mission to provide you with exceptional heart care, we have created designated Provider Care Teams.  These Care Teams include your primary Cardiologist (physician) and Advanced Practice Providers (APPs -  Physician Assistants and Nurse Practitioners) who all work together to provide you with the care you need, when you need it.  We recommend signing up for the patient portal called "MyChart".  Sign up information is provided on this After Visit Summary.  MyChart is used to connect with patients for Virtual Visits (Telemedicine).  Patients are able to view lab/test results, encounter notes, upcoming appointments, etc.  Non-urgent messages can be sent to your provider as well.   To learn more about what you can do with MyChart, go to [email or web address].    Your next appointment:    Keep 08/31/21 appointment   The format for your next appointment:   In Person  Provider:   Evalina Field, MD     Other Instructions   Important Information About Sugar

## 2021-07-22 NOTE — Progress Notes (Signed)
Office Visit    Patient Name: Cassidy [name] Date of Encounter: 07/22/2021  Primary Care Provider:  [name], DO Primary Cardiologist:  Evalina Field, MD  Chief Complaint    56 year old female with a history of CAD s/p PCI in 2014 in 2015, s/p CABG x3 in 2023, hypertension, hyperlipidemia, type 2 diabetes, COPD, tobacco use, fibromyalgia, PTSD, and GERD who presents for hospital follow-up related to CAD s/p CABG x3.  Past Medical History    Past Medical History:  Diagnosis Date   Anemia    has sickle cell trait   Atherosclerotic heart disease of native coronary artery with angina pectoris ([place]) 05/2012, 10/2013   a.  s/p PCTA to dRCA and ostial RPAV-PLA vessel + PDA branch (05/2012)  b. [place] s/p DES to mLAD - Resolute DES 3.0 x 22 (3.11mm -->3.3 mm)   Bipolar depression ([place])    Breast abscess    a. right side.    COPD (chronic obstructive pulmonary disease) (HCC)    Depression    Diabetic peripheral neuropathy (HCC)    Fibromyalgia    Genital warts    GERD (gastroesophageal reflux disease)    History of hiatal hernia    HLD (hyperlipidemia)    Hypertension    Pain in limb    a. LE VENOUS DUPLEX, 02/05/2009 - no evidence of deep vein thrombosis, Baker's cyst   PTSD (post-traumatic stress disorder)    Sickle cell trait ([place])    Tobacco abuse    Tooth caries    Type II diabetes mellitus ([place])    Past Surgical History:  Procedure Laterality Date   BLADDER SURGERY  1980   "TVT"   BLADDER SURGERY  2003   "TVT"   BREAST EXCISIONAL BIOPSY     BREAST SURGERY Right    I&D for multiple abscesses   CARDIAC CATHETERIZATION     CORONARY ARTERY BYPASS GRAFT N/A 06/30/2021   Procedure: CORONARY ARTERY BYPASS GRAFTING (CABG) x3 USING LEFT INTERNAL MAMMARY ARTERY,  RIGHT RADIAL ARTERY AND LEFT GREATER SAPHENOUS VEIN;  Surgeon: Lajuana Matte, MD;  Location: [place];  Service: Open Heart Surgery;  Laterality: N/A;   cutting balloon     ENDOVEIN HARVEST OF GREATER  SAPHENOUS VEIN Left 06/30/2021   Procedure: ENDOVEIN HARVEST OF GREATER SAPHENOUS VEIN;  Surgeon: Lajuana Matte, MD;  Location: [place];  Service: Open Heart Surgery;  Laterality: Left;   EYE SURGERY Left    laser surgery   INCISE AND DRAIN ABCESS  ; 04/26/11; 08/13/11   right breast   INCISION AND DRAINAGE ABSCESS Right 05/09/2012   Procedure: INCISION AND DRAINAGE RIGHT BREAST ABSCESS;  Surgeon: Imogene Burn. Georgette Dover, MD;  Location: [place];  Service: General;  Laterality: Right;   INCISION AND DRAINAGE ABSCESS Right 02/08/2014   Procedure: INCISION AND DRAINAGE RIGHT BREAST ABSCESS;  Surgeon: Donnie Mesa, MD;  Location: [place];  Service: General;  Laterality: Right;   INCISION AND DRAINAGE ABSCESS Right 06/14/2014   Procedure: INCISION AND DRAINAGE RIGHT BREAST ABSCESS;  Surgeon: Donnie Mesa, MD;  Location: [place];  Service: General;  Laterality: Right;   IRRIGATION AND DEBRIDEMENT ABSCESS  12/19/2010   Procedure: IRRIGATION AND DEBRIDEMENT ABSCESS;  Surgeon: Rolm Bookbinder, MD;  Location: [place];  Service: General;  Laterality: Right;   IRRIGATION AND DEBRIDEMENT ABSCESS  08/13/2011   Procedure: MINOR INCISION AND DRAINAGE OF ABSCESS;  Surgeon: Gwenyth Ober, MD;  Location: [place];  Service: General;  Laterality: Right;  Right Breast    IRRIGATION AND DEBRIDEMENT ABSCESS  11/17/2011   Procedure: IRRIGATION AND DEBRIDEMENT ABSCESS;  Surgeon: Imogene Burn. Georgette Dover, MD;  Location: [place];  Service: General;  Laterality: Right;  irrigation and debridement right recurrent breast abscess   LARYNX SURGERY     LEFT HEART CATH AND CORONARY ANGIOGRAPHY N/A 04/09/2021   Procedure: LEFT HEART CATH AND CORONARY ANGIOGRAPHY;  Surgeon: Jettie Booze, MD;  Location: [place] CV [facility];  Service: Cardiovascular;  Laterality: N/A;   LEFT HEART CATHETERIZATION WITH CORONARY ANGIOGRAM N/A 05/29/2012   Procedure: LEFT HEART CATHETERIZATION WITH CORONARY ANGIOGRAM & PTCA;  Surgeon: Troy Sine, MD;  Location:  [facility] CATH LAB;  Service: Cardiovascular; Bifurcation dRCA-RPAD/PLA & rPDA  PTCA.   LEFT HEART CATHETERIZATION WITH CORONARY ANGIOGRAM N/A 11/08/2013   Procedure: LEFT HEART CATHETERIZATION WITH CORONARY ANGIOGRAM and Coronary Stent Intervention;  Surgeon: Leonie Man, MD;  Location: [facility] CATH LAB;  Service: Cardiovascular; mLAD 80% (FFR 0.73) - resolute DES 3.0 x 22 mm (postdilated to 3.3 mm->3.5 mm)   NM MYOVIEW LTD  01/26/2009   Normal study, no evidence of ischemia, EF 67%   ployp removed from voice box 03/30/12     RADIAL ARTERY HARVEST Right 06/30/2021   Procedure: RADIAL ARTERY HARVEST;  Surgeon: Lajuana Matte, MD;  Location: [place];  Service: Open Heart Surgery;  Laterality: Right;   REFRACTIVE SURGERY  ~ 2010   right   TEE WITHOUT CARDIOVERSION N/A 06/30/2021   Procedure: TRANSESOPHAGEAL ECHOCARDIOGRAM (TEE);  Surgeon: Lajuana Matte, MD;  Location: [place];  Service: Open Heart Surgery;  Laterality: N/A;   TONSILLECTOMY  1988    Allergies  Allergies  Allergen Reactions   Amoxicillin Hives    Has patient had a PCN reaction causing immediate rash, facial/tongue/throat swelling, SOB or lightheadedness with hypotension: Yes Has patient had a PCN reaction causing severe rash involving mucus membranes or skin necrosis: No Has patient had a PCN reaction that required hospitalization No Has patient had a PCN reaction occurring within the last 10 years: Yes If all of the above answers are "NO", then may proceed with Cephalosporin use.   Varenicline Other (See Comments)    Nightmares - hallucinations    Buprenorphine Hcl-Naloxone Hcl Nausea And Vomiting    Other reaction(s): Vomiting    History of Present Illness    56 year old female with the above past medical history including CAD s/p PCI in 2014 in 2015, s/p CABG x3 in 2023, hypertension, hyperlipidemia, type 2 diabetes, COPD, tobacco use, fibromyalgia, PTSD, and GERD.  She has a history of CAD s/p balloon  angioplasty-dRCA/ PDA /PLV in 2014.  Additionally, she underwent PCI-mLAD in 2015.  Lexiscan Myoview in October 2022 showed artifact, no evidence of ischemia.  Echocardiogram in March 2023 showed EF 60 to 65%, normal LV function, G1 DD, trivial MR.  She was last seen in the office on 04/03/2021 and reported intermittent chest pressure, responsive to nitroglycerin.  She was started on Imdur and referred for outpatient cardiac catheterization revealed severe three-vessel CAD, outpatient CT surgery evaluation was recommended.  She underwent CABG x3  (LIMA LAD, RSVG distal RCA, R radial to OM) on 06/30/2021.  Hospitalized from 06/30/2021 to 07/05/2021.  Her post surgery hospital course was uncomplicated.  She was discharged home in stable condition on 07/05/2021.  She saw CT surgery on 07/17/2021 with complaints of right upper extremity cellulitis, left lower extremity seroma, mild lower extremity edema.  She was prescribed antibiotics and  given Lasix 40 mg daily.  She presents today for follow-up.  Since her hospitalization since her most recent visit with CT surgery she has done well from a cardiac standpoint. Her edema has improved with Lasix. Additionally, her upper extremity cellulitis has improved with antibiotics.  She states she has quit smoking. Overall, she reports feeling well and denies any new concerns today.  Home Medications    Current Outpatient Medications  Medication Sig Dispense Refill   ACCU-CHEK AVIVA PLUS test strip TEST BLOOD SUGAR THREE TIMES DAILY AS DIRECTED 100 each 2   amLODipine (NORVASC) 5 MG tablet Take 1 tablet (5 mg total) by mouth at bedtime. 90 tablet 3   aspirin EC 81 MG tablet Take 81 mg by mouth daily.     Aspirin-Caffeine 500-32.5 MG TABS Take 2 tablets by mouth 2 (two) times daily as needed (pain).     atorvastatin (LIPITOR) 40 MG tablet TAKE 1 TABLET (40 MG TOTAL) BY MOUTH DAILY. 90 tablet 3   busPIRone (BUSPAR) 15 MG tablet TAKE 1/2 TABLET (7.5 MG TOTAL) BY MOUTH TWICE  DAILY 30 tablet 11   Carboxymethylcellul-Glycerin (LUBRICATING EYE DROPS OP) Place 1 drop into both eyes daily as needed (dry eyes).     carvedilol (COREG) 12.5 MG tablet Take 1 tablet (12.5 mg total) by mouth 2 (two) times daily with a meal. 180 tablet 1   diclofenac sodium (VOLTAREN) 1 % GEL Apply 1 application topically 3 (three) times daily as needed (pain).     diphenhydramine-acetaminophen (TYLENOL PM) 25-500 MG TABS tablet Take 1 tablet by mouth at bedtime as needed (sleep).     doxycycline (VIBRA-TABS) 100 MG tablet Take 1 tablet (100 mg total) by mouth 2 (two) times daily. 20 tablet 0   ferrous sulfate 325 (65 FE) MG tablet Take 325 mg by mouth daily with breakfast.     fluticasone (FLONASE) 50 MCG/ACT nasal spray PLACE 1 SPRAY INTO BOTH NOSTRILS DAILY AS NEEDED (CONGESTION). 16 g 1   fluticasone-salmeterol (ADVAIR) 100-50 MCG/ACT AEPB Inhale 1 puff into the lungs 2 (two) times daily. 1 each 3   furosemide (LASIX) 40 MG tablet Take 1 tablet (40 mg total) by mouth daily as needed. If you gain 3 lbs in 24 hours or 5 lbs in 1 week 30 tablet 1   ipratropium (ATROVENT HFA) 17 MCG/ACT inhaler Inhale 2 puffs into the lungs every 4 (four) hours as needed for wheezing (COPD).     LINZESS 290 MCG CAPS capsule TAKE 1 CAPSULE (290 MCG TOTAL) BY MOUTH DAILY BEFORE BREAKFAST. (Patient taking differently: Take 290 mcg by mouth every Monday.) 30 capsule 3   nicotine polacrilex (NICORETTE) 2 MG gum Take 1 each (2 mg total) by mouth as needed for smoking cessation. 100 tablet 0   nitroGLYCERIN (NITROSTAT) 0.4 MG SL tablet Place 1 tablet (0.4 mg total) under the tongue every 5 (five) minutes as needed for chest pain. 25 tablet 5   pantoprazole (PROTONIX) 40 MG tablet TAKE 1 TABLET (40 MG TOTAL) BY MOUTH DAILY. 30 tablet 1   polyethylene glycol powder (GLYCOLAX/MIRALAX) 17 GM/SCOOP powder Take 17 g by mouth daily. Mix with 8-12 oz of water (Patient taking differently: Take 17 g by mouth daily as needed for  moderate constipation. Mix with 8-12 oz of water) 3350 g 1   potassium chloride SA (KLOR-CON M) 20 MEQ tablet Take 1 tablet (20 mEq total) by mouth daily as needed. On days you take Lasix 30 tablet 1   sertraline (  ZOLOFT) 50 MG tablet Take 1 tablet (50 mg total) by mouth daily. 30 tablet 3   sulfamethoxazole-trimethoprim (BACTRIM DS) 800-160 MG tablet Take 1 tablet by mouth 2 (two) times daily. 6 tablet 0   tiZANidine (ZANAFLEX) 2 MG tablet Take 1 tablet (2 mg total) by mouth every 8 (eight) hours as needed for muscle spasms. 21 tablet 0   TRULICITY 2.02 RK/2.7CW SOPN INJECT 0.75 MG INTO THE SKIN ONCE A WEEK. 2 mL 0   valACYclovir (VALTREX) 1000 MG tablet TAKE FOR 3 DAYS AS NEEDED FOR EACH OUTBREAK 30 tablet 11   vitamin B-12 (CYANOCOBALAMIN) 1000 MCG tablet Take 1,000 mcg by mouth daily.     No current facility-administered medications for this visit.     Review of Systems    She denies chest pain, palpitations, dyspnea, pnd, orthopnea, n, v, dizziness, syncope, edema, weight gain, or early satiety. All other systems reviewed and are otherwise negative except as noted above.     Cardiac Rehabilitation Eligibility Assessment  The patient is ready to start cardiac rehabilitation pending clearance from the cardiac surgeon.    Physical Exam    VS:  BP (!) 115/52   Pulse 88   Ht 5\' 7"  (1.702 m)   Wt 206 lb 12.8 oz (93.8 kg)   LMP 06/12/2016 (Approximate)   SpO2 98%   BMI 32.39 kg/m  GEN: Well nourished, well developed, in no acute distress. HEENT: normal. Neck: Supple, no JVD, carotid bruits, or masses. Cardiac: RRR, no murmurs, rubs, or gallops. No clubbing, cyanosis, edema.  Radials/DP/PT 2+ and equal bilaterally.  Respiratory:  Respirations regular and unlabored, clear to auscultation bilaterally. GI: Soft, nontender, nondistended, BS + x 4. MS: no deformity or atrophy. Skin: warm and dry, no rash.  Sternal incision clean, dry, intact.  Right forearm incision clean, dry,  intact. Neuro:  Strength and sensation are intact. Psych: Normal affect.  Accessory Clinical Findings    ECG personally reviewed by me today -sinus rhythm, 88 bpm, PACs- no acute changes.  Lab Results  Component Value Date   WBC 9.9 07/05/2021   HGB 9.5 (L) 07/05/2021   HCT 27.4 (L) 07/05/2021   MCV 89.8 07/05/2021   PLT 285 07/05/2021   Lab Results  Component Value Date   CREATININE 0.67 07/05/2021   BUN 10 07/05/2021   NA 139 07/05/2021   K 3.4 (L) 07/05/2021   CL 104 07/05/2021   CO2 27 07/05/2021   Lab Results  Component Value Date   ALT 14 06/30/2021   AST 25 06/30/2021   ALKPHOS 29 (L) 06/30/2021   BILITOT 0.9 06/30/2021   Lab Results  Component Value Date   CHOL 131 05/15/2021   HDL 60 05/15/2021   LDLCALC 56 05/15/2021   TRIG 77 05/15/2021   CHOLHDL 2.2 05/15/2021    Lab Results  Component Value Date   HGBA1C 6.8 (H) 06/26/2021    Assessment & Plan    1. CAD: S/p PCI in 2014 in 2015, s/p CABG x3  (LIMA LAD, RSVG distal RCA, R radial to OM) on 06/30/2021.  She does note occasional discomfort under her left breast around her rib cage, this is worse with certain positions and with deep breaths.  Likely musculoskeletal, proved with tramadol and muscle relaxant.  Otherwise, stable with no anginal symptoms.  Her sternal incision is healing well as well as her right forearm incision.  Continue aspirin, amlodipine, carvedilol, losartan, Lasix as needed, and Lipitor.  Encouraged cardiac rehab once cleared  by CT surgery. Follow-up with CT surgery as planned.  2. Hypertension: BP well controlled. Continue current antihypertensive regimen.  BP cuff provided in office today for ongoing BP monitoring.  3. Hyperlipidemia: LDL was 56 in May 2023.  Continue ASA, atorvastatin.  4. Type 2 diabetes: A1c was 6.8 in June 2023.  Monitored and managed per PCP.  5. Tobacco use: She states she has quit smoking, ongoing cessation advised.  6. Disposition: Follow-up with CT  surgery, follow-up as scheduled with Dr. Audie Box in August 2023.  Lenna Sciara, NP 07/22/2021, 12:57 PM

## 2021-07-24 ENCOUNTER — Ambulatory Visit (INDEPENDENT_AMBULATORY_CARE_PROVIDER_SITE_OTHER): Payer: Self-pay | Admitting: Thoracic Surgery (Cardiothoracic Vascular Surgery)

## 2021-07-24 VITALS — BP 105/69 | HR 96 | Resp 18 | Ht 67.0 in | Wt 205.0 lb

## 2021-07-24 DIAGNOSIS — I2510 Atherosclerotic heart disease of native coronary artery without angina pectoris: Secondary | ICD-10-CM

## 2021-07-24 DIAGNOSIS — Z951 Presence of aortocoronary bypass graft: Secondary | ICD-10-CM

## 2021-07-24 MED ORDER — PREGABALIN 25 MG PO CAPS: 25.0000 mg | ORAL_CAPSULE | Freq: Two times a day (BID) | ORAL | 0 refills | Status: DC

## 2021-07-24 NOTE — Progress Notes (Signed)
      [address]       [place], [address]             [phone]        Jailynn D [place] Medical Record #[id number] Date of Birth: 03-Nov-1965  Referring: Jettie Booze, MD Primary Care: [name], DO Primary Cardiologist:[place] Doreatha Lew, MD  Reason for visit:   follow-up  History of Present Illness:     [name] comes today for another follow-up appointment.  She is status post CABG with subsequently developed right radial harvest site infection.  She has been on doxycycline.  She only complains of some musculoskeletal pain.  Physical Exam: BP 105/69 (BP Location: Left Arm, Patient Position: Sitting)   Pulse 96   Resp 18   Ht 5\' 7"  (1.702 m)   Wt 205 lb (93 kg)   LMP 06/12/2016 (Approximate)   SpO2 100% Comment: RA  BMI 32.11 kg/m   Alert NAD Incision clean.  Sternum stable Abdomen, ND No peripheral edema       Assessment / Plan:   56 year old female status post CABG.  Currently doing well.  I have given her a prescription for Lyrica for paresthesias along her vein harvest site.  She is to complete her full course of antibiotics for her radial incision infection.  She is cleared for cardiac rehab.  She will follow-up as needed.   Lajuana Matte 07/24/2021 2:57 PM

## 2021-07-27 ENCOUNTER — Other Ambulatory Visit: Payer: Self-pay

## 2021-07-27 NOTE — Progress Notes (Signed)
Referral placed to outpatient [facility] per Dr. Prescott Gum.

## 2021-07-28 ENCOUNTER — Other Ambulatory Visit: Payer: Self-pay | Admitting: Physician Assistant

## 2021-07-28 ENCOUNTER — Other Ambulatory Visit: Payer: Self-pay | Admitting: Thoracic Surgery (Cardiothoracic Vascular Surgery)

## 2021-07-28 MED ORDER — TRAMADOL HCL 50 MG PO TABS: 50.0000 mg | ORAL_TABLET | Freq: Four times a day (QID) | ORAL | 0 refills | Status: AC | PRN

## 2021-08-03 ENCOUNTER — Telehealth: Payer: Self-pay | Admitting: *Deleted
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
## 2021-08-13 ENCOUNTER — Other Ambulatory Visit: Payer: Self-pay

## 2021-08-13 ENCOUNTER — Ambulatory Visit: Payer: Medicaid Other

## 2021-08-13 DIAGNOSIS — E1165 Type 2 diabetes mellitus with hyperglycemia: Secondary | ICD-10-CM

## 2021-08-13 MED ORDER — TRULICITY 0.75 MG/0.5ML ~~LOC~~ SOAJ: 0.7500 mg | SUBCUTANEOUS | 0 refills | Status: DC

## 2021-08-13 MED ORDER — PANTOPRAZOLE SODIUM 40 MG PO TBEC
40.0000 mg | DELAYED_RELEASE_TABLET | Freq: Every day | ORAL | 1 refills | Status: DC
Start: 2021-08-13 — End: 2021-09-08

## 2021-08-14 ENCOUNTER — Other Ambulatory Visit: Payer: Self-pay | Admitting: Family Medicine

## 2021-08-14 DIAGNOSIS — E1165 Type 2 diabetes mellitus with hyperglycemia: Secondary | ICD-10-CM

## 2021-08-17 DIAGNOSIS — H10413 Chronic giant papillary conjunctivitis, bilateral: Secondary | ICD-10-CM | POA: Diagnosis not present

## 2021-08-17 DIAGNOSIS — H5201 Hypermetropia, right eye: Secondary | ICD-10-CM | POA: Diagnosis not present

## 2021-08-17 DIAGNOSIS — H0102A Squamous blepharitis right eye, upper and lower eyelids: Secondary | ICD-10-CM | POA: Diagnosis not present

## 2021-08-17 DIAGNOSIS — H2513 Age-related nuclear cataract, bilateral: Secondary | ICD-10-CM | POA: Diagnosis not present

## 2021-08-17 DIAGNOSIS — E119 Type 2 diabetes mellitus without complications: Secondary | ICD-10-CM | POA: Diagnosis not present

## 2021-08-17 DIAGNOSIS — H0102B Squamous blepharitis left eye, upper and lower eyelids: Secondary | ICD-10-CM | POA: Diagnosis not present

## 2021-08-18 ENCOUNTER — Emergency Department (HOSPITAL_COMMUNITY)
Admission: EM | Admit: 2021-08-18 | Discharge: 2021-08-18 | Disposition: A | Discharge status: Home / Self Care | Payer: Medicaid Other | Attending: Emergency Medicine | Admitting: Emergency Medicine

## 2021-08-18 ENCOUNTER — Emergency Department (HOSPITAL_COMMUNITY): Payer: Medicaid Other

## 2021-08-18 ENCOUNTER — Encounter (HOSPITAL_COMMUNITY): Payer: Self-pay | Admitting: Emergency Medicine

## 2021-08-18 ENCOUNTER — Other Ambulatory Visit: Payer: Self-pay

## 2021-08-18 DIAGNOSIS — Z7982 Long term (current) use of aspirin: Secondary | ICD-10-CM | POA: Insufficient documentation

## 2021-08-18 DIAGNOSIS — R911 Solitary pulmonary nodule: Secondary | ICD-10-CM | POA: Diagnosis not present

## 2021-08-18 DIAGNOSIS — J811 Chronic pulmonary edema: Secondary | ICD-10-CM | POA: Diagnosis not present

## 2021-08-18 DIAGNOSIS — J984 Other disorders of lung: Secondary | ICD-10-CM | POA: Diagnosis not present

## 2021-08-18 DIAGNOSIS — Z452 Encounter for adjustment and management of vascular access device: Secondary | ICD-10-CM | POA: Diagnosis not present

## 2021-08-18 DIAGNOSIS — I2510 Atherosclerotic heart disease of native coronary artery without angina pectoris: Secondary | ICD-10-CM | POA: Diagnosis not present

## 2021-08-18 DIAGNOSIS — D72829 Elevated white blood cell count, unspecified: Secondary | ICD-10-CM | POA: Insufficient documentation

## 2021-08-18 DIAGNOSIS — Z79899 Other long term (current) drug therapy: Secondary | ICD-10-CM | POA: Insufficient documentation

## 2021-08-18 DIAGNOSIS — Z720 Tobacco use: Secondary | ICD-10-CM | POA: Insufficient documentation

## 2021-08-18 DIAGNOSIS — I10 Essential (primary) hypertension: Secondary | ICD-10-CM | POA: Insufficient documentation

## 2021-08-18 DIAGNOSIS — R791 Abnormal coagulation profile: Secondary | ICD-10-CM | POA: Insufficient documentation

## 2021-08-18 DIAGNOSIS — R0789 Other chest pain: Secondary | ICD-10-CM | POA: Diagnosis not present

## 2021-08-18 DIAGNOSIS — E119 Type 2 diabetes mellitus without complications: Secondary | ICD-10-CM | POA: Diagnosis not present

## 2021-08-18 DIAGNOSIS — J449 Chronic obstructive pulmonary disease, unspecified: Secondary | ICD-10-CM | POA: Insufficient documentation

## 2021-08-18 DIAGNOSIS — R918 Other nonspecific abnormal finding of lung field: Secondary | ICD-10-CM | POA: Insufficient documentation

## 2021-08-18 DIAGNOSIS — K449 Diaphragmatic hernia without obstruction or gangrene: Secondary | ICD-10-CM | POA: Diagnosis not present

## 2021-08-18 DIAGNOSIS — R079 Chest pain, unspecified: Secondary | ICD-10-CM | POA: Diagnosis not present

## 2021-08-18 DIAGNOSIS — Z951 Presence of aortocoronary bypass graft: Secondary | ICD-10-CM | POA: Insufficient documentation

## 2021-08-18 DIAGNOSIS — R0989 Other specified symptoms and signs involving the circulatory and respiratory systems: Secondary | ICD-10-CM | POA: Diagnosis not present

## 2021-08-18 LAB — LIPASE, BLOOD: Lipase: 49 U/L (ref 11–51)

## 2021-08-18 LAB — BASIC METABOLIC PANEL
Anion gap: 10 (ref 5–15)
BUN: 10 mg/dL (ref 6–20)
CO2: 23 mmol/L (ref 22–32)
Calcium: 9.3 mg/dL (ref 8.9–10.3)
Chloride: 105 mmol/L (ref 98–111)
Creatinine, Ser: 0.85 mg/dL (ref 0.44–1.00)
GFR, Estimated: >60 mL/min (ref >60)
Glucose, Bld: 237 mg/dL — ABNORMAL HIGH (ref 70–99)
Potassium: 4.2 mmol/L (ref 3.5–5.1)
Sodium: 138 mmol/L (ref 135–145)

## 2021-08-18 LAB — CBC
HCT: 37.7 % (ref 36.0–46.0)
Hemoglobin: 12.8 g/dL (ref 12.0–15.0)
MCH: 29.4 pg (ref 26.0–34.0)
MCHC: 34 g/dL (ref 30.0–36.0)
MCV: 86.7 fL (ref 80.0–100.0)
Platelets: 407 10*3/uL — ABNORMAL HIGH (ref 150–400)
RBC: 4.35 MIL/uL (ref 3.87–5.11)
RDW: 13.2 % (ref 11.5–15.5)
WBC: 11.3 10*3/uL — ABNORMAL HIGH (ref 4.0–10.5)
nRBC: 0 % (ref 0.0–0.2)

## 2021-08-18 LAB — HEPATIC FUNCTION PANEL
ALT: 11 U/L (ref 0–44)
AST: 11 U/L — ABNORMAL LOW (ref 15–41)
Albumin: 3.9 g/dL (ref 3.5–5.0)
Alkaline Phosphatase: 80 U/L (ref 38–126)
Bilirubin, Direct: <0.1 mg/dL (ref 0.0–0.2)
Total Bilirubin: 0.6 mg/dL (ref 0.3–1.2)
Total Protein: 7.4 g/dL (ref 6.5–8.1)

## 2021-08-18 LAB — CBG MONITORING, ED: Glucose-Capillary: 152 mg/dL — ABNORMAL HIGH (ref 70–99)

## 2021-08-18 LAB — D-DIMER, QUANTITATIVE: D-Dimer, Quant: 1.09 ug/mL-FEU — ABNORMAL HIGH (ref 0.00–0.50)

## 2021-08-18 LAB — TROPONIN I (HIGH SENSITIVITY)
Troponin I (High Sensitivity): 4 ng/L (ref <18)
Troponin I (High Sensitivity): 4 ng/L (ref <18)

## 2021-08-18 MED ORDER — METHOCARBAMOL 500 MG PO TABS: 500.0000 mg | ORAL_TABLET | Freq: Three times a day (TID) | ORAL | 0 refills | Status: DC | PRN

## 2021-08-18 MED ORDER — LIDOCAINE 5 % EX PTCH: 1.0000 | MEDICATED_PATCH | CUTANEOUS | 0 refills | Status: DC

## 2021-08-18 MED ORDER — LIDOCAINE 5 % EX PTCH
1.0000 | MEDICATED_PATCH | CUTANEOUS | Status: DC
Start: 2021-08-18 — End: 2021-08-19
  Administered 2021-08-18: 1 via TRANSDERMAL
  Filled 2021-08-18: qty 1

## 2021-08-18 MED ORDER — IOHEXOL 350 MG/ML SOLN
65.0000 mL | Freq: Once | INTRAVENOUS | Status: AC | PRN
  Administered 2021-08-18: 65 mL via INTRAVENOUS

## 2021-08-18 NOTE — ED Notes (Signed)
Patient verbalizes understanding of d/c instructions. Opportunities for questions and answers were provided. Pt d/c from ED and ambulated to lobby.

## 2021-08-18 NOTE — ED Provider Notes (Signed)
[place] EMERGENCY DEPARTMENT Provider Note   CSN: [id number] Arrival date & time: 08/18/21  1443     History  Chief Complaint  Patient presents with   Chest Pain    Cassidy [name] is a 56 y.o. 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  Patient is followed by cardiologist Dr. Audie Box.  Presents to the emergency department complaint of chest pain.  Patient reports that her pain began on Sunday morning and has been constant since than.  Pain is located on her left breast and wraps around to left flank and back.  Patient describes the pain as "sharp."  Pain is worse with touch and movement.  Patient denies any alleviating factors.  Patient reports that she does have associated shortness of breath when the pain increases on movement.  Patient reports that she felt lightheaded earlier today however that has since resolved.  Patient denies any leg swelling or tenderness, hemoptysis, palpitations, fever, chills, abdominal pain, nausea, vomiting, syncope.   Chest Pain Associated symptoms: no abdominal pain, no back pain, no dizziness, no fever, no headache, no nausea, no palpitations, no shortness of breath and no vomiting        Home Medications Prior to Admission medications   Medication Sig Start Date End Date Taking? Authorizing Provider  ACCU-CHEK AVIVA PLUS test strip TEST BLOOD SUGAR THREE TIMES DAILY AS DIRECTED 12/21/20   [name], DO  amLODipine (NORVASC) 5 MG tablet Take 1 tablet (5 mg total) by mouth at bedtime. 05/15/21   [name], DO  aspirin EC 81 MG tablet Take 81 mg by mouth daily.    [provider]  Aspirin-Caffeine 500-32.5 MG TABS Take 2 tablets by mouth 2 (two) times daily as needed (pain).    [provider]  atorvastatin (LIPITOR) 40 MG tablet TAKE 1 TABLET (40 MG TOTAL) BY MOUTH DAILY. 04/14/21    [name], DO  busPIRone (BUSPAR) 15 MG tablet TAKE 1/2 TABLET (7.5 MG TOTAL) BY MOUTH TWICE DAILY 04/14/21   [name], DO  Carboxymethylcellul-Glycerin (LUBRICATING EYE DROPS OP) Place 1 drop into both eyes daily as needed (dry eyes).    [provider]  carvedilol (COREG) 12.5 MG tablet Take 1 tablet (12.5 mg total) by mouth 2 (two) times daily with a meal. 05/01/21   O'Neal, Cassie Freer, MD  diclofenac sodium (VOLTAREN) 1 % GEL Apply 1 application topically 3 (three) times daily as needed (pain).    [provider]  diphenhydramine-acetaminophen (TYLENOL PM) 25-500 MG TABS tablet Take 1 tablet by mouth at bedtime as needed (sleep).    [provider]  doxycycline (VIBRA-TABS) 100 MG tablet Take 1 tablet (100 mg total) by mouth 2 (two) times daily. 07/17/21   Barrett, Erin R, PA-C  Dulaglutide (TRULICITY) 2.20 UR/4.2HC SOPN Inject 0.75 mg into the skin once a week. 08/13/21   [name], DO  ferrous sulfate 325 (65 FE) MG tablet Take 325 mg by mouth daily with breakfast.    [provider]  fluticasone (FLONASE) 50 MCG/ACT nasal spray PLACE 1 SPRAY INTO BOTH NOSTRILS DAILY AS NEEDED (CONGESTION). 05/13/21   [name], DO  fluticasone-salmeterol (ADVAIR) 100-50 MCG/ACT AEPB Inhale 1 puff into the lungs 2 (two) times daily. 06/05/21   [name], DO  furosemide (LASIX) 40 MG tablet Take 1 tablet (40 mg total) by mouth daily as needed. If you gain 3  lbs in 24 hours or 5 lbs in 1 week 07/17/21   Barrett, Erin R, PA-C  ipratropium (ATROVENT HFA) 17 MCG/ACT inhaler Inhale 2 puffs into the lungs every 4 (four) hours as needed for wheezing (COPD).    [provider]  LINZESS 290 MCG CAPS capsule TAKE 1 CAPSULE (290 MCG TOTAL) BY MOUTH DAILY BEFORE BREAKFAST. Patient taking differently: Take 290 mcg by mouth every Monday. 05/13/21   Mauri Pole, MD  nicotine polacrilex (NICORETTE) 2 MG gum Take 1 each (2 mg total) by  mouth as needed for smoking cessation. 06/03/21   [name], DO  nitroGLYCERIN (NITROSTAT) 0.4 MG SL tablet Place 1 tablet (0.4 mg total) under the tongue every 5 (five) minutes as needed for chest pain. 04/24/20   [name], DO  pantoprazole (PROTONIX) 40 MG tablet Take 1 tablet (40 mg total) by mouth daily. 08/13/21   [name], DO  polyethylene glycol powder (GLYCOLAX/MIRALAX) 17 GM/SCOOP powder Take 17 g by mouth daily. Mix with 8-12 oz of water Patient taking differently: Take 17 g by mouth daily as needed for moderate constipation. Mix with 8-12 oz of water 07/21/20   [name], DO  potassium chloride SA (KLOR-CON M) 20 MEQ tablet Take 1 tablet (20 mEq total) by mouth daily as needed. On days you take Lasix 07/17/21   Barrett, Erin R, PA-C  pregabalin (LYRICA) 25 MG capsule Take 1 capsule (25 mg total) by mouth 2 (two) times daily. 07/24/21   Lajuana Matte, MD  sertraline (ZOLOFT) 50 MG tablet Take 1 tablet (50 mg total) by mouth daily. 05/15/21   [name], DO  sulfamethoxazole-trimethoprim (BACTRIM DS) 800-160 MG tablet Take 1 tablet by mouth 2 (two) times daily. 07/17/21   Barrett, Erin R, PA-C  tiZANidine (ZANAFLEX) 2 MG tablet Take 1 tablet (2 mg total) by mouth every 8 (eight) hours as needed for muscle spasms. 04/14/21   Hazel Sams, PA-C  valACYclovir (VALTREX) 1000 MG tablet TAKE FOR 3 DAYS AS NEEDED FOR EACH OUTBREAK 04/14/21   [name], DO  vitamin B-12 (CYANOCOBALAMIN) 1000 MCG tablet Take 1,000 mcg by mouth daily.    [provider]      Allergies    Amoxicillin, Varenicline, and Buprenorphine hcl-naloxone hcl    Review of Systems   Review of Systems  Constitutional:  Negative for chills and fever.  Eyes:  Negative for visual disturbance.  Respiratory:  Negative for shortness of breath.   Cardiovascular:  Positive for chest pain. Negative for palpitations and leg swelling.  Gastrointestinal:  Negative for  abdominal pain, nausea and vomiting.  Genitourinary:  Negative for difficulty urinating and dysuria.  Musculoskeletal:  Negative for back pain and neck pain.  Skin:  Negative for color change and rash.  Neurological:  Negative for dizziness, syncope, light-headedness and headaches.  Psychiatric/Behavioral:  Negative for confusion.     Physical Exam Updated Vital Signs BP 123/87   Pulse (!) 101   Temp 98 F (36.7 C) (Oral)   Resp 16   LMP 06/12/2016 (Approximate)   SpO2 100%  Physical Exam Vitals and nursing note reviewed. Exam conducted with a chaperone present (Female RN present as chaperone).  Constitutional:      General: She is not in acute distress.    Appearance: She is not ill-appearing, toxic-appearing or diaphoretic.  HENT:     Head: Normocephalic.  Eyes:     General: No scleral icterus.       Right eye:  No discharge.        Left eye: No discharge.  Cardiovascular:     Rate and Rhythm: Normal rate.     Heart sounds: Normal heart sounds, S1 normal and S2 normal. Heart sounds not distant.  Pulmonary:     Effort: Pulmonary effort is normal. No tachypnea, bradypnea or respiratory distress.     Breath sounds: Normal breath sounds. No stridor.  Chest:     Chest wall: Tenderness present. No mass, lacerations, deformity, swelling, crepitus or edema. There is no dullness to percussion.     Comments: Tenderness to left chest wall underneath left breast and to left flank.  No rash, wound, erythema, deformity, or crepitus in the area. Abdominal:     General: Abdomen is protuberant. There is no distension. There are no signs of injury.     Palpations: Abdomen is soft. There is no mass or pulsatile mass.     Tenderness: There is no abdominal tenderness. There is no guarding or rebound.     Hernia: There is no hernia in the umbilical area or ventral area.  Musculoskeletal:     Right lower leg: No edema.     Left lower leg: No edema.  Skin:    General: Skin is warm and dry.   Neurological:     General: No focal deficit present.     Mental Status: She is alert.  Psychiatric:        Behavior: Behavior is cooperative.     ED Results / Procedures / Treatments   Labs (all labs ordered are listed, but only abnormal results are displayed) Labs Reviewed  BASIC METABOLIC PANEL - Abnormal; Notable for the following components:      Result Value   Glucose, Bld 237 (*)    All other components within normal limits  CBC - Abnormal; Notable for the following components:   WBC 11.3 (*)    Platelets 407 (*)    All other components within normal limits  CBG MONITORING, ED - Abnormal; Notable for the following components:   Glucose-Capillary 152 (*)    All other components within normal limits  LIPASE, BLOOD  HEPATIC FUNCTION PANEL  TROPONIN I (HIGH SENSITIVITY)  TROPONIN I (HIGH SENSITIVITY)    EKG EKG Interpretation  Date/Time:  Tuesday August 18 2021 14:58:21 EDT Ventricular Rate:  97 PR Interval:  156 QRS Duration: 82 QT Interval:  356 QTC Calculation: 452 R Axis:   33 Text Interpretation: Normal sinus rhythm Low voltage QRS Cannot rule out Anterior infarct , age undetermined Abnormal ECG When compared with ECG of 01-Jul-2021 06:57, PREVIOUS ECG IS PRESENT No acute changes No significant change since last tracing Confirmed by Varney Biles ([id number]) on 08/18/2021 6:45:35 PM  Radiology CT Angio Chest PE W and/or Wo Contrast  Result Date: 08/18/2021 CLINICAL DATA:  Left-sided chest pain. EXAM: CT ANGIOGRAPHY CHEST WITH CONTRAST TECHNIQUE: Multidetector CT imaging of the chest was performed using the standard protocol during bolus administration of intravenous contrast. Multiplanar CT image reconstructions and MIPs were obtained to evaluate the vascular anatomy. RADIATION DOSE REDUCTION: This exam was performed according to the departmental dose-optimization program which includes automated exposure control, adjustment of the mA and/or kV according to patient  size and/or use of iterative reconstruction technique. CONTRAST:  25mL OMNIPAQUE IOHEXOL 350 MG/ML SOLN COMPARISON:  May 26, 2012 and May 22, 2015 FINDINGS: Cardiovascular: There is mild calcification of the thoracic aorta, without evidence of aortic aneurysm. Satisfactory opacification of the pulmonary arteries to  the segmental level. No evidence of pulmonary embolism. Normal heart size with marked severity coronary artery calcification. A very small, predominantly anterior pericardial effusion is noted. Mediastinum/Nodes: There is moderate to marked severity subcarinal and right hilar lymphadenopathy. Thyroid gland, trachea, and esophagus demonstrate no significant findings. Lungs/Pleura: A 3.2 cm x 2.0 cm x 2.2 cm heterogeneous soft lung mass is seen within the right upper lobe. Innumerable bilateral noncalcified lung nodules of various sizes are also noted. There is no evidence of acute infiltrate, pleural effusion or pneumothorax. Upper Abdomen: There is a small hiatal hernia. A predominantly stable 2.3 cm x 2.0 cm low-attenuation (approximately 29.65 Hounsfield units) right adrenal mass is seen. Musculoskeletal: Multiple sternal wires are present. No acute osseous abnormalities are identified. Review of the MIP images confirms the above findings. IMPRESSION: 1. No evidence of pulmonary embolism. 2. 3.2 cm x 2.0 cm x 2.2 cm heterogeneous soft lung mass within the right upper lobe with innumerable bilateral noncalcified lung nodules of various sizes, consistent with metastatic disease. 3. Moderate to marked severity subcarinal and right hilar lymphadenopathy. 4. Stable right adrenal mass likely consistent with an adrenal adenoma. 5. Small hiatal hernia. Aortic Atherosclerosis (ICD10-I70.0). Electronically Signed   By: Virgina Norfolk M.D.   On: 08/18/2021 21:05   DG Ribs Unilateral W/Chest Left  Result Date: 08/18/2021 CLINICAL DATA:  Left-sided chest pain and shortness of breath since Sunday. EXAM: LEFT  RIBS AND CHEST - 3+ VIEW COMPARISON:  07/02/2021 FINDINGS: Frontal view of the chest and two views of left ribs. The frontal view of the chest demonstrates removal of right IJ line. Median sternotomy for CABG. Midline trachea. Normal heart size. No pleural effusion or pneumothorax. Improvement in interstitial edema with mild pulmonary venous congestion remaining. Right upper lobe nodular density again identified and has been detailed on multiple prior radiographs, including 06/26/2021. Left rib radiographs demonstrate no displaced fracture or callus deposition. IMPRESSION: No displaced rib fracture, pleural fluid, or pneumothorax. Mild pulmonary venous congestion in the setting of recent CABG. Right upper lobe lung nodular density, as detailed on multiple prior radiographs. I personally called and discussed this report with Dr. Alvino Chapel. At 16:02 on 08/18/2021. Electronically Signed   By: Abigail Miyamoto M.D.   On: 08/18/2021 16:17    Procedures Procedures    Medications Ordered in ED Medications  lidocaine (LIDODERM) 5 % 1 patch (1 patch Transdermal Patch Applied 08/18/21 1952)  iohexol (OMNIPAQUE) 350 MG/ML injection 65 mL (65 mLs Intravenous Contrast Given 08/18/21 2041)    ED Course/ Medical Decision Making/ A&P                           Medical Decision Making Amount and/or Complexity of Data Reviewed Labs: ordered. Radiology: ordered.  Risk Prescription drug management.   Alert 56 year old female in no acute distress, nontoxic-appearing.  Presents to the emergency department complaint of chest pain.  Information was obtained from patient.  I reviewed patient's past medical records including previous prior notes, labs, and imaging.  Patient has medical history as outlined in HPI was complicates her care.  Due to reports of chest pain ACS work-up was initiated while patient was in triage.  Patient also had tenderness to epigastric area with previous provider and therefore lipase and hepatic  function panel were added.  As pain is worse with deep inhalation will obtain D-dimer to evaluate for possible PE as there is Eliquis at this time.  I personally viewed and interpreted  patient's EKG.  Tracing shows sinus rhythm.  I personally viewed interpret patient's x-ray imaging.  Imaging shows no displaced rib fracture, pleural fluid, or pneumothorax.  Mild pulmonary venous congestion.  Right upper lobe nodular density as detailed on multiple prior radiographs.  I personally viewed interpret patient's lab results.  Pertinent findings include: -Leukocytosis 11.3 -Leukos 237 -Troponin 4 and 4 with delta of 0 -Dimer 1.09; due to elevated dimer will order CTA chest  I personally viewed interpret patient's CT imaging.  Agree with radiology interpretation of: -No evidence of PE -Heterogeneous soft lung mass to right upper lobe with innumerable bilateral noncalcified lung nodules of various sizes -Moderate to marked severity subcarinal and right hilar lymphadenopathy  Due to patient's lung mass with no previous diagnosis I spoke with on-call pulmonologist Dr. Trevor Mace who advised that they will reach out to patient tomorrow about getting lung biopsy in the outpatient setting.  Patient is improvement in her pain after application of lidocaine patch.  With pain reproducible with touch and movement and improved with lidocaine patch suspect that it is musculoskeletal in nature.  Will prescribe patient with short course of Robaxin and lidocaine patch.  Patient was informed of her CT findings and given copy of the results.  Patient was advised to call pulmonologist if she did not hear from them.  Patient to follow-up with PCP for her chest wall pain.  Patient care discussed with attending physician Dr. Kathrynn Humble.  Based on patient's chief complaint, I considered admission might be necessary, however after reassuring ED workup feel patient is reasonable for discharge.  Discussed results,  findings, treatment and follow up. Patient advised of return precautions. Patient verbalized understanding and agreed with plan.  Portions of this note were generated with [employer]. Dictation errors may occur despite best attempts at proofreading.         Final Clinical Impression(s) / ED Diagnoses Final diagnoses:  Lung mass  Chest wall pain    Rx / DC Orders ED Discharge Orders          Ordered    lidocaine (LIDODERM) 5 %  Every 24 hours        08/18/21 2201    methocarbamol (ROBAXIN) 500 MG tablet  Every 8 hours PRN        08/18/21 2201              Loni Beckwith, PA-C 08/18/21 2342    Varney Biles, MD 08/18/21 2351

## 2021-08-18 NOTE — ED Triage Notes (Signed)
Patient complains of left sided chest pain and shortness of breath since Sunday this week. Patient describes pain as a burning sensation under her left breast. Patient is alert, oriented, and in no apparent distress at this time.

## 2021-08-18 NOTE — ED Provider Triage Note (Signed)
Emergency Medicine Provider Triage Evaluation Note  CHANTELL KUNKLER , a 56 y.o. female  was evaluated in triage.  Pt complains of pain underneath the left breast that started on Sunday.  Patient describes the pain as burning and quite severe.  Any type of movement seems to aggravate pain.  No alleviating factors.  Also endorses some shortness of breath and numbness and tingling of the left arm.  Patient had CABG done in June.  She had no trauma or injury.  Review of Systems  Positive:  Negative:   Physical Exam  BP 120/78 (BP Location: Left Arm)   Pulse (!) 105   Temp 98.6 F (37 C) (Oral)   Resp (!) 22   LMP 06/12/2016 (Approximate)   SpO2 97%  Gen:   Awake, tearful and distressed Resp:  Normal effort  MSK:   Moves extremities without difficulty  Other:  Reproducible tenderness to the epigastric and when pressing on the left rib cage underneath the left breast.  Lungs CTA bilaterally  Medical Decision Making  Medically screening exam initiated at 3:20 PM.  Appropriate orders placed.  HAVA MASSINGALE was informed that the remainder of the evaluation will be completed by another provider, this initial triage assessment does not replace that evaluation, and the importance of remaining in the ED until their evaluation is complete.     Tonye Pearson, [place] 08/18/21 [phone]

## 2021-08-18 NOTE — Discharge Instructions (Addendum)
You came to the emergency department today to be evaluated for your left chest wall pain.  Your lab results, EKG, and chest x-ray were reassuring that you are not having a heart attack today.  It appears that your pain is musculoskeletal in nature.  Please use the lidocaine patch, Tylenol, and muscle relaxers to help with your pain.  Please follow-up closely with your primary care doctor.  The CT scan of your chest showed a 3.2 cm x 2.0 cm x 2.2 cm heterogeneous soft lung mass within the right upper lobe with innumerable bilateral noncalcified lung nodules of various sizes, concerning for cancer.  The pulmonology team should call you tomorrow to discuss scheduling a biopsy to obtain further information about your lung mass.  Please make sure to talk to your primary care doctor about this issue as well.  Today you were prescribed Methocarbamol (Robaxin).  Methocarbamol (Robaxin) is used to treat muscle spasms/pain.  It works by helping to relax the muscles.  Drowsiness, dizziness, lightheadedness, stomach upset, nausea/vomiting, or blurred vision may occur.  Do not drive, use machinery, or do anything that needs alertness or clear vision until you can do it safely.  Do not combine this medication with alcoholic beverages, marijuana, or other central nervous system depressants.     Get help right away if: You cannot catch your breath. You have sudden chest pain. You start making high-pitched whistling sounds when you breathe, most often when you breathe out (you wheeze). You cannot stop coughing, or you cough up blood or bloody mucus from your lungs (sputum). You become dizzy or feel like you may faint.

## 2021-08-19 ENCOUNTER — Telehealth: Payer: Self-pay | Admitting: Pulmonary Disease

## 2021-08-19 NOTE — Telephone Encounter (Signed)
LVMTCB to schedule consult visit, [facility]

## 2021-08-19 NOTE — Telephone Encounter (Signed)
Left another voicemail for patient to call back to schedule urgent consult with BI today, 8/9 or 8/10.

## 2021-08-19 NOTE — Telephone Encounter (Signed)
Please arrange outpatient consult for bronchoscopy ASAP.  Right upper lobe mass, recent CABG.

## 2021-08-19 NOTE — Telephone Encounter (Signed)
Dr. Valeta Harms is hoping to get Cassidy [name] in for a consultation either today 8/9 or tomorrow 8/10. Plan is for the bronch to happen next Tuesday.  We need to try to reach out to Cassidy [name] again to see if we can get her scheduled for an appt either today 8/9 or tomorrow 8/10.

## 2021-08-19 NOTE — Telephone Encounter (Signed)
Message sent by Dr. Valeta Harms to try to get pt in for an urgent appt with him for consultation. Attempted to call pt but unable to reach. Left message for her to return call.

## 2021-08-20 ENCOUNTER — Ambulatory Visit (INDEPENDENT_AMBULATORY_CARE_PROVIDER_SITE_OTHER): Payer: Medicaid Other | Admitting: Pulmonary Disease

## 2021-08-20 ENCOUNTER — Other Ambulatory Visit: Payer: Self-pay

## 2021-08-20 ENCOUNTER — Encounter (HOSPITAL_COMMUNITY): Payer: Self-pay | Admitting: Pulmonary Disease

## 2021-08-20 ENCOUNTER — Encounter: Payer: Self-pay | Admitting: Pulmonary Disease

## 2021-08-20 ENCOUNTER — Other Ambulatory Visit: Payer: Self-pay | Admitting: Pulmonary Disease

## 2021-08-20 VITALS — BP 80/62 | HR 94 | Ht 67.0 in | Wt 207.0 lb

## 2021-08-20 DIAGNOSIS — C3490 Malignant neoplasm of unspecified part of unspecified bronchus or lung: Secondary | ICD-10-CM | POA: Diagnosis not present

## 2021-08-20 DIAGNOSIS — Z720 Tobacco use: Secondary | ICD-10-CM

## 2021-08-20 DIAGNOSIS — R599 Enlarged lymph nodes, unspecified: Secondary | ICD-10-CM | POA: Diagnosis not present

## 2021-08-20 DIAGNOSIS — R918 Other nonspecific abnormal finding of lung field: Secondary | ICD-10-CM | POA: Insufficient documentation

## 2021-08-20 LAB — SARS CORONAVIRUS 2 (TAT 6-24 HRS): SARS Coronavirus 2: NEGATIVE

## 2021-08-20 MED ORDER — OXYCODONE-ACETAMINOPHEN 5-325 MG PO TABS: 1.0000 | ORAL_TABLET | Freq: Four times a day (QID) | ORAL | 0 refills | Status: DC | PRN

## 2021-08-20 NOTE — Progress Notes (Signed)
Spoke with pt for pre-op call. Pt recently had a CABG done in June. She states she has done very well and has not had any issues with her heart. Pt is diabetic. Last A1C was 6.8 on 06/26/21. Pt is only on Trulicity and takes once a week on Mondays. Pt states her fasting blood sugar is around 157. Instructed pt to check her blood sugar when she wakes up in the AM and every 2 hours until she leaves for the hospital. If blood sugar is 70 or below, treat with 1/2 cup of clear juice (apple or cranberry) and recheck blood sugar 15 minutes after drinking juice. If blood sugar continues to be 70 or below, call the Short Stay department and ask to speak to a nurse. Pt voiced understanding.   Shower instructions given to pt (she has CHG from surgery in June and will use that).

## 2021-08-20 NOTE — Patient Instructions (Signed)
Thank you for visiting Dr. Valeta Harms at [facility]. Today we recommend the following:  Orders Placed This Encounter  Procedures   Procedural/ Surgical Case Request: VIDEO BRONCHOSCOPY WITH ENDOBRONCHIAL ULTRASOUND   Ambulatory referral to Pulmonology   Meds ordered this encounter  Medications   oxyCODONE-acetaminophen (PERCOCET) 5-325 MG tablet    Sig: Take 1-2 tablets by mouth every 6 (six) hours as needed for severe pain.    Dispense:  45 tablet    Refill:  0   Return in about 1 week (around 08/27/2021) for with Eric Form, NP.    Please do your part to reduce the spread of COVID-19.

## 2021-08-20 NOTE — Progress Notes (Signed)
Synopsis: Referred in August 2023 for multiple pulmonary nodules by [name], DO  Subjective:   PATIENT ID: Cassidy [name] GENDER: female DOB: 1965-08-25, MRN: [id number]  Chief Complaint  Patient presents with   Consult    Consult: Lung mass    This is a 56 year old female, past medical history of hypertension, hyperlipidemia, type 2 diabetes.  Patient had a recent CABG by Dr. Kipp Brood.  Patient had a CT scan of the chestBack in June had a chest x-ray with a nodule.  Nodular opacity still seen on chest x-ray on 07/02/2021.  Patient was discharged from the hospital ultimately came back to the ER 08/18/2021 found to have a 3.2 x 2 x 2.2 heterogenous soft tissue mass with innumerable pulmonary nodules within the lung concerning for advanced stage malignancy.  Patient was seen in the ER due to pain in her left chest.  She was referred to see me urgently for consideration of bronchoscopy and tissue biopsy.    Past Medical History:  Diagnosis Date   Anemia    has sickle cell trait   Atherosclerotic heart disease of native coronary artery with angina pectoris ([place]) 05/2012, 10/2013   a.  s/p PCTA to dRCA and ostial RPAV-PLA vessel + PDA branch (05/2012)  b. [place] s/p DES to mLAD - Resolute DES 3.0 x 22 (3.58mm -->3.3 mm)   Bipolar depression ([place])    Breast abscess    a. right side.    COPD (chronic obstructive pulmonary disease) (HCC)    Depression    Diabetic peripheral neuropathy (HCC)    Fibromyalgia    Genital warts    GERD (gastroesophageal reflux disease)    History of hiatal hernia    HLD (hyperlipidemia)    Hypertension    Pain in limb    a. LE VENOUS DUPLEX, 02/05/2009 - no evidence of deep vein thrombosis, Baker's cyst   PTSD (post-traumatic stress disorder)    Sickle cell trait (HCC)    Tobacco abuse    Tooth caries    Type II diabetes mellitus ([place])      Family History  Problem Relation Age of Onset   Esophageal cancer Mother    COPD Mother    Cancer  Mother        lymphoma   Heart disease Mother    Stomach cancer Mother    Hyperlipidemia Father    Heart disease Father    Cancer Maternal Aunt        breast, colon   Crohn's disease Maternal Aunt    Hypertension Maternal Grandmother    Diabetes Maternal Grandmother    Hypertension Brother    Rectal cancer Neg Hx    Liver cancer Neg Hx    Colon cancer Neg Hx    Breast cancer Neg Hx      Past Surgical History:  Procedure Laterality Date   BLADDER SURGERY  1980   "TVT"   BLADDER SURGERY  2003   "TVT"   BREAST EXCISIONAL BIOPSY     BREAST SURGERY Right    I&D for multiple abscesses   CARDIAC CATHETERIZATION     CORONARY ARTERY BYPASS GRAFT N/A 06/30/2021   Procedure: CORONARY ARTERY BYPASS GRAFTING (CABG) x3 USING LEFT INTERNAL MAMMARY ARTERY,  RIGHT RADIAL ARTERY AND LEFT GREATER SAPHENOUS VEIN;  Surgeon: Lajuana Matte, MD;  Location: [facility];  Service: Open Heart Surgery;  Laterality: N/A;   cutting balloon     ENDOVEIN HARVEST OF GREATER SAPHENOUS VEIN Left 06/30/2021  Procedure: ENDOVEIN HARVEST OF GREATER SAPHENOUS VEIN;  Surgeon: Lajuana Matte, MD;  Location: [facility];  Service: Open Heart Surgery;  Laterality: Left;   EYE SURGERY Left    laser surgery   INCISE AND DRAIN ABCESS  ; 04/26/11; 08/13/11   right breast   INCISION AND DRAINAGE ABSCESS Right 05/09/2012   Procedure: INCISION AND DRAINAGE RIGHT BREAST ABSCESS;  Surgeon: Imogene Burn. Georgette Dover, MD;  Location: [facility];  Service: General;  Laterality: Right;   INCISION AND DRAINAGE ABSCESS Right 02/08/2014   Procedure: INCISION AND DRAINAGE RIGHT BREAST ABSCESS;  Surgeon: Donnie Mesa, MD;  Location: [facility];  Service: General;  Laterality: Right;   INCISION AND DRAINAGE ABSCESS Right 06/14/2014   Procedure: INCISION AND DRAINAGE RIGHT BREAST ABSCESS;  Surgeon: Donnie Mesa, MD;  Location: [facility];  Service: General;  Laterality: Right;   IRRIGATION AND DEBRIDEMENT ABSCESS  12/19/2010   Procedure: IRRIGATION AND  DEBRIDEMENT ABSCESS;  Surgeon: Rolm Bookbinder, MD;  Location: [facility];  Service: General;  Laterality: Right;   IRRIGATION AND DEBRIDEMENT ABSCESS  08/13/2011   Procedure: MINOR INCISION AND DRAINAGE OF ABSCESS;  Surgeon: Gwenyth Ober, MD;  Location: [facility];  Service: General;  Laterality: Right;  Right Breast    IRRIGATION AND DEBRIDEMENT ABSCESS  11/17/2011   Procedure: IRRIGATION AND DEBRIDEMENT ABSCESS;  Surgeon: Imogene Burn. Georgette Dover, MD;  Location: [facility];  Service: General;  Laterality: Right;  irrigation and debridement right recurrent breast abscess   LARYNX SURGERY     LEFT HEART CATH AND CORONARY ANGIOGRAPHY N/A 04/09/2021   Procedure: LEFT HEART CATH AND CORONARY ANGIOGRAPHY;  Surgeon: Jettie Booze, MD;  Location: [facility];  Service: Cardiovascular;  Laterality: N/A;   LEFT HEART CATHETERIZATION WITH CORONARY ANGIOGRAM N/A 05/29/2012   Procedure: LEFT HEART CATHETERIZATION WITH CORONARY ANGIOGRAM & PTCA;  Surgeon: Troy Sine, MD;  Location: [facility] C[facility] LAB;  Service: Cardiovascular; Bifurcation dRCA-RPAD/PLA & rPDA  PTCA.   LEFT HEART CATHETERIZATION WITH CORONARY ANGIOGRAM N/A 11/08/2013   Procedure: LEFT HEART CATHETERIZATION WITH CORONARY ANGIOGRAM and Coronary Stent Intervention;  Surgeon: Leonie Man, MD;  Location: [facility] CATH LAB;  Service: Cardiovascular; mLAD 80% (FFR 0.73) - resolute DES 3.0 x 22 mm (postdilated to 3.3 mm->3.5 mm)   NM MYOVIEW LTD  01/26/2009   Normal study, no evidence of ischemia, EF 67%   ployp removed from voice box 03/30/12     RADIAL ARTERY HARVEST Right 06/30/2021   Procedure: RADIAL ARTERY HARVEST;  Surgeon: Lajuana Matte, MD;  Location: [facility];  Service: Open Heart Surgery;  Laterality: Right;   REFRACTIVE SURGERY  ~ 2010   right   TEE WITHOUT CARDIOVERSION N/A 06/30/2021   Procedure: TRANSESOPHAGEAL ECHOCARDIOGRAM (TEE);  Surgeon: Lajuana Matte, MD;  Location: [place];  Service: Open Heart Surgery;  Laterality: N/A;    TONSILLECTOMY  1988    Social History   Socioeconomic History   Marital status: Single    Spouse name: Not on file   Number of children: 1   Years of education: Not on file   Highest education level: Not on file  Occupational History   Occupation: homemaker   Occupation: disabled  Tobacco Use   Smoking status: Every Day    Packs/day: 0.50    Years: 42.00    Total pack years: 21.00    Types: Cigarettes    Start date: 01/12/1979   Smokeless tobacco: Never   Tobacco comments:    08/14/11 "  quit for 3 wk 05/2011; was smoking 1 ppd since age 8 before I quit; at least I've cut down to 1/4 ppd",  Smoking 3 per day 08/20/2021. 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, marijuana, crack cocaine)   Drug use: Yes    Comment: 08/14/11 'quit everything in 2005"   Sexual activity: Yes    Partners: Male    Birth control/protection: Condom  Other Topics Concern   Not on file  Social History Narrative   Work: disability   Children: son, 64 yrs old   Regular exercise: some/ heart attack in May/ walks 1 mile 3 days a week   Caffeine use: daily, cup of coffee daily   Social Determinants of Health   Financial Resource Strain: Not on file  Food Insecurity: Not on file  Transportation Needs: Not on file  Physical Activity: Not on file  Stress: Not on file  Social Connections: Not on file  Intimate Partner Violence: Not on file     Allergies  Allergen Reactions   Amoxicillin Hives    Has patient had a PCN reaction causing immediate rash, facial/tongue/throat swelling, SOB or lightheadedness with hypotension: Yes Has patient had a PCN reaction causing severe rash involving mucus membranes or skin necrosis: No Has patient had a PCN reaction that required hospitalization No Has patient had a PCN reaction occurring within the last 10 years:  Yes If all of the above answers are "NO", then may proceed with Cephalosporin use.   Varenicline Other (See Comments)    Nightmares - hallucinations    Buprenorphine Hcl-Naloxone Hcl Nausea And Vomiting    Other reaction(s): Vomiting     Outpatient Medications Prior to Visit  Medication Sig Dispense Refill   fluticasone (FLONASE) 50 MCG/ACT nasal spray PLACE 1 SPRAY INTO BOTH NOSTRILS DAILY AS NEEDED (CONGESTION). 16 g 1   fluticasone-salmeterol (ADVAIR) 100-50 MCG/ACT AEPB Inhale 1 puff into the lungs 2 (two) times daily. 1 each 3   ipratropium (ATROVENT HFA) 17 MCG/ACT inhaler Inhale 2 puffs into the lungs every 4 (four) hours as needed for wheezing (COPD).     ACCU-CHEK AVIVA PLUS test strip TEST BLOOD SUGAR THREE TIMES DAILY AS DIRECTED 100 each 2   amLODipine (NORVASC) 5 MG tablet Take 1 tablet (5 mg total) by mouth at bedtime. 90 tablet 3   aspirin EC 81 MG tablet Take 81 mg by mouth daily.     Aspirin-Caffeine 500-32.5 MG TABS Take 2 tablets by mouth 2 (two) times daily as needed (pain).     atorvastatin (LIPITOR) 40 MG tablet TAKE 1 TABLET (40 MG TOTAL) BY MOUTH DAILY. 90 tablet 3   busPIRone (BUSPAR) 15 MG tablet TAKE 1/2 TABLET (7.5 MG TOTAL) BY MOUTH TWICE DAILY 30 tablet 11   Carboxymethylcellul-Glycerin (LUBRICATING EYE DROPS OP) Place 1 drop into both eyes daily as needed (dry eyes).     carvedilol (COREG) 12.5 MG tablet Take 1 tablet (12.5 mg total) by mouth 2 (two) times daily with a meal. 180 tablet 1   diclofenac sodium (VOLTAREN) 1 % GEL Apply 1 application topically 3 (three) times daily as needed (pain).     diphenhydramine-acetaminophen (TYLENOL PM) 25-500 MG TABS tablet Take 1 tablet by mouth at bedtime as needed (sleep).     doxycycline (VIBRA-TABS) 100 MG tablet Take 1  tablet (100 mg total) by mouth 2 (two) times daily. 20 tablet 0   Dulaglutide (TRULICITY) 6.21 HY/8.6VH SOPN Inject 0.75 mg into the skin once a week. 2 mL 0   ferrous sulfate 325 (65 FE) MG tablet  Take 325 mg by mouth daily with breakfast.     furosemide (LASIX) 40 MG tablet Take 1 tablet (40 mg total) by mouth daily as needed. If you gain 3 lbs in 24 hours or 5 lbs in 1 week 30 tablet 1   lidocaine (LIDODERM) 5 % Place 1 patch onto the skin daily. Remove & Discard patch within 12 hours or as directed by MD 30 patch 0   LINZESS 290 MCG CAPS capsule TAKE 1 CAPSULE (290 MCG TOTAL) BY MOUTH DAILY BEFORE BREAKFAST. (Patient taking differently: Take 290 mcg by mouth every Monday.) 30 capsule 3   methocarbamol (ROBAXIN) 500 MG tablet Take 1 tablet (500 mg total) by mouth every 8 (eight) hours as needed for muscle spasms. 20 tablet 0   nicotine polacrilex (NICORETTE) 2 MG gum Take 1 each (2 mg total) by mouth as needed for smoking cessation. 100 tablet 0   nitroGLYCERIN (NITROSTAT) 0.4 MG SL tablet Place 1 tablet (0.4 mg total) under the tongue every 5 (five) minutes as needed for chest pain. 25 tablet 5   pantoprazole (PROTONIX) 40 MG tablet Take 1 tablet (40 mg total) by mouth daily. 30 tablet 1   polyethylene glycol powder (GLYCOLAX/MIRALAX) 17 GM/SCOOP powder Take 17 g by mouth daily. Mix with 8-12 oz of water (Patient taking differently: Take 17 g by mouth daily as needed for moderate constipation. Mix with 8-12 oz of water) 3350 g 1   potassium chloride SA (KLOR-CON M) 20 MEQ tablet Take 1 tablet (20 mEq total) by mouth daily as needed. On days you take Lasix 30 tablet 1   pregabalin (LYRICA) 25 MG capsule Take 1 capsule (25 mg total) by mouth 2 (two) times daily. 60 capsule 0   sertraline (ZOLOFT) 50 MG tablet Take 1 tablet (50 mg total) by mouth daily. 30 tablet 3   sulfamethoxazole-trimethoprim (BACTRIM DS) 800-160 MG tablet Take 1 tablet by mouth 2 (two) times daily. 6 tablet 0   valACYclovir (VALTREX) 1000 MG tablet TAKE FOR 3 DAYS AS NEEDED FOR EACH OUTBREAK 30 tablet 11   vitamin B-12 (CYANOCOBALAMIN) 1000 MCG tablet Take 1,000 mcg by mouth daily.     No facility-administered medications  prior to visit.    Review of Systems  Constitutional:  Negative for chills, fever, malaise/fatigue and weight loss.  HENT:  Negative for hearing loss, sore throat and tinnitus.   Eyes:  Negative for blurred vision and double vision.  Respiratory:  Positive for shortness of breath. Negative for cough, hemoptysis, sputum production, wheezing and stridor.   Cardiovascular:  Negative for chest pain, palpitations, orthopnea, leg swelling and PND.  Gastrointestinal:  Negative for abdominal pain, constipation, diarrhea, heartburn, nausea and vomiting.  Genitourinary:  Negative for dysuria, hematuria and urgency.  Musculoskeletal:  Positive for back pain. Negative for joint pain and myalgias.  Skin:  Negative for itching and rash.  Neurological:  Negative for dizziness, tingling, weakness and headaches.  Endo/Heme/Allergies:  Negative for environmental allergies. Does not bruise/bleed easily.  Psychiatric/Behavioral:  Negative for depression. The patient is not nervous/anxious and does not have insomnia.   All other systems reviewed and are negative.    Objective:  Physical Exam Vitals reviewed.  Constitutional:      General: She  is not in acute distress.    Appearance: She is well-developed.  HENT:     Head: Normocephalic and atraumatic.  Eyes:     General: No scleral icterus.    Conjunctiva/sclera: Conjunctivae normal.     Pupils: Pupils are equal, round, and reactive to light.  Neck:     Vascular: No JVD.     Trachea: No tracheal deviation.  Cardiovascular:     Rate and Rhythm: Normal rate and regular rhythm.     Heart sounds: Normal heart sounds. No murmur heard. Pulmonary:     Effort: Pulmonary effort is normal. No tachypnea, accessory muscle usage or respiratory distress.     Breath sounds: No stridor. No wheezing, rhonchi or rales.  Abdominal:     General: There is no distension.     Palpations: Abdomen is soft.     Tenderness: There is no abdominal tenderness.   Musculoskeletal:        General: No tenderness.     Cervical back: Neck supple.  Lymphadenopathy:     Cervical: No cervical adenopathy.  Skin:    General: Skin is warm and dry.     Capillary Refill: Capillary refill takes less than 2 seconds.     Findings: No rash.  Neurological:     Mental Status: She is alert and oriented to person, place, and time.  Psychiatric:        Behavior: Behavior normal.      Vitals:   08/20/21 1044  BP: (!) 80/62  Pulse: 94  SpO2: 100%  Weight: 207 lb (93.9 kg)  Height: 5\' 7"  (1.702 m)   100% on RA BMI Readings from Last 3 Encounters:  08/20/21 32.42 kg/m  08/18/21 32.11 kg/m  07/24/21 32.11 kg/m   Wt Readings from Last 3 Encounters:  08/20/21 207 lb (93.9 kg)  08/18/21 205 lb 0.4 oz (93 kg)  07/24/21 205 lb (93 kg)     CBC    Component Value Date/Time   WBC 11.3 (H) 08/18/2021 1458   RBC 4.35 08/18/2021 1458   HGB 12.8 08/18/2021 1458   HGB 13.1 04/03/2021 1059   HCT 37.7 08/18/2021 1458   HCT 38.8 04/03/2021 1059   PLT 407 (H) 08/18/2021 1458   PLT 305 04/03/2021 1059   MCV 86.7 08/18/2021 1458   MCV 90 04/03/2021 1059   MCH 29.4 08/18/2021 1458   MCHC 34.0 08/18/2021 1458   RDW 13.2 08/18/2021 1458   RDW 12.4 04/03/2021 1059   LYMPHSABS 3.0 01/26/2021 1556   MONOABS 0.6 01/26/2021 1556   EOSABS 0.3 01/26/2021 1556   BASOSABS 0.1 01/26/2021 1556     Chest Imaging: 08/18/2021 CTA chest: Multi widespread pulmonary nodules largest in the right chest concerning for advanced bronchogenic carcinoma or other malignancy with distant metastasis. The patient's images have been independently reviewed by me.    Pulmonary Functions Testing Results:     No data to display          FeNO:   Pathology:   Echocardiogram:   Heart Catheterization:     Assessment & Plan:     ICD-10-CM   1. Lung mass  R91.8 Procedural/ Surgical Case Request: VIDEO BRONCHOSCOPY WITH ENDOBRONCHIAL ULTRASOUND    Ambulatory referral to  Pulmonology    2. Adenopathy  R59.9     3. Tobacco use  Z72.0       Discussion:  This is a 56 year old female, past medical history of tobacco use, recent CABG status post surgery.  Has a right upper lobe lung mass also with associated mediastinal adenopathy and widespread multiple innumerable pulmonary nodules within the chest concerning for malignancy.  Plan: Today in the office we discussed the risk benefits and alternatives of proceeding with bronchoscopy. Patient needs videobronchoscopy the endobronchial ultrasound transbronchial needle aspiration of the mediastinal adenopathy to confirm tissue diagnosis what appears to be an advanced stage malignancy. Patient will also need next generation sequencing, [employer] CDX blood testing. Once we get a tissue diagnosis we will get her over to see medical oncology ASAP. We will tentatively plan for bronchoscopy tomorrow morning.    Current Outpatient Medications:    fluticasone (FLONASE) 50 MCG/ACT nasal spray, PLACE 1 SPRAY INTO BOTH NOSTRILS DAILY AS NEEDED (CONGESTION)., Disp: 16 g, Rfl: 1   fluticasone-salmeterol (ADVAIR) 100-50 MCG/ACT AEPB, Inhale 1 puff into the lungs 2 (two) times daily., Disp: 1 each, Rfl: 3   ipratropium (ATROVENT HFA) 17 MCG/ACT inhaler, Inhale 2 puffs into the lungs every 4 (four) hours as needed for wheezing (COPD)., Disp: , Rfl:    ACCU-CHEK AVIVA PLUS test strip, TEST BLOOD SUGAR THREE TIMES DAILY AS DIRECTED, Disp: 100 each, Rfl: 2   amLODipine (NORVASC) 5 MG tablet, Take 1 tablet (5 mg total) by mouth at bedtime., Disp: 90 tablet, Rfl: 3   aspirin EC 81 MG tablet, Take 81 mg by mouth daily., Disp: , Rfl:    Aspirin-Caffeine 500-32.5 MG TABS, Take 2 tablets by mouth 2 (two) times daily as needed (pain)., Disp: , Rfl:    atorvastatin (LIPITOR) 40 MG tablet, TAKE 1 TABLET (40 MG TOTAL) BY MOUTH DAILY., Disp: 90 tablet, Rfl: 3   busPIRone (BUSPAR) 15 MG tablet, TAKE 1/2 TABLET (7.5 MG TOTAL) BY MOUTH TWICE  DAILY, Disp: 30 tablet, Rfl: 11   Carboxymethylcellul-Glycerin (LUBRICATING EYE DROPS OP), Place 1 drop into both eyes daily as needed (dry eyes)., Disp: , Rfl:    carvedilol (COREG) 12.5 MG tablet, Take 1 tablet (12.5 mg total) by mouth 2 (two) times daily with a meal., Disp: 180 tablet, Rfl: 1   diclofenac sodium (VOLTAREN) 1 % GEL, Apply 1 application topically 3 (three) times daily as needed (pain)., Disp: , Rfl:    diphenhydramine-acetaminophen (TYLENOL PM) 25-500 MG TABS tablet, Take 1 tablet by mouth at bedtime as needed (sleep)., Disp: , Rfl:    doxycycline (VIBRA-TABS) 100 MG tablet, Take 1 tablet (100 mg total) by mouth 2 (two) times daily., Disp: 20 tablet, Rfl: 0   Dulaglutide (TRULICITY) 1.57 WI/2.0BT SOPN, Inject 0.75 mg into the skin once a week., Disp: 2 mL, Rfl: 0   ferrous sulfate 325 (65 FE) MG tablet, Take 325 mg by mouth daily with breakfast., Disp: , Rfl:    furosemide (LASIX) 40 MG tablet, Take 1 tablet (40 mg total) by mouth daily as needed. If you gain 3 lbs in 24 hours or 5 lbs in 1 week, Disp: 30 tablet, Rfl: 1   lidocaine (LIDODERM) 5 %, Place 1 patch onto the skin daily. Remove & Discard patch within 12 hours or as directed by MD, Disp: 30 patch, Rfl: 0   LINZESS 290 MCG CAPS capsule, TAKE 1 CAPSULE (290 MCG TOTAL) BY MOUTH DAILY BEFORE BREAKFAST. (Patient taking differently: Take 290 mcg by mouth every Monday.), Disp: 30 capsule, Rfl: 3   methocarbamol (ROBAXIN) 500 MG tablet, Take 1 tablet (500 mg total) by mouth every 8 (eight) hours as needed for muscle spasms., Disp: 20 tablet, Rfl: 0   nicotine polacrilex (NICORETTE)  2 MG gum, Take 1 each (2 mg total) by mouth as needed for smoking cessation., Disp: 100 tablet, Rfl: 0   nitroGLYCERIN (NITROSTAT) 0.4 MG SL tablet, Place 1 tablet (0.4 mg total) under the tongue every 5 (five) minutes as needed for chest pain., Disp: 25 tablet, Rfl: 5   pantoprazole (PROTONIX) 40 MG tablet, Take 1 tablet (40 mg total) by mouth daily.,  Disp: 30 tablet, Rfl: 1   polyethylene glycol powder (GLYCOLAX/MIRALAX) 17 GM/SCOOP powder, Take 17 g by mouth daily. Mix with 8-12 oz of water (Patient taking differently: Take 17 g by mouth daily as needed for moderate constipation. Mix with 8-12 oz of water), Disp: 3350 g, Rfl: 1   potassium chloride SA (KLOR-CON M) 20 MEQ tablet, Take 1 tablet (20 mEq total) by mouth daily as needed. On days you take Lasix, Disp: 30 tablet, Rfl: 1   pregabalin (LYRICA) 25 MG capsule, Take 1 capsule (25 mg total) by mouth 2 (two) times daily., Disp: 60 capsule, Rfl: 0   sertraline (ZOLOFT) 50 MG tablet, Take 1 tablet (50 mg total) by mouth daily., Disp: 30 tablet, Rfl: 3   sulfamethoxazole-trimethoprim (BACTRIM DS) 800-160 MG tablet, Take 1 tablet by mouth 2 (two) times daily., Disp: 6 tablet, Rfl: 0   valACYclovir (VALTREX) 1000 MG tablet, TAKE FOR 3 DAYS AS NEEDED FOR EACH OUTBREAK, Disp: 30 tablet, Rfl: 11   vitamin B-12 (CYANOCOBALAMIN) 1000 MCG tablet, Take 1,000 mcg by mouth daily., Disp: , Rfl:   I spent 62 minutes dedicated to the care of this patient on the date of this encounter to include pre-visit review of records, face-to-face time with the patient discussing conditions above, post visit ordering of testing, clinical documentation with the electronic health record, making appropriate referrals as documented, and communicating necessary findings to members of the patients care team.   Garner Nash, DO [facility] 08/20/2021 11:25 AM

## 2021-08-20 NOTE — H&P (View-Only) (Signed)
Synopsis: Referred in August 2023 for multiple pulmonary nodules by [name], DO  Subjective:   PATIENT ID: Cassidy [name] GENDER: female DOB: Jul 14, 1965, MRN: [id number]  Chief Complaint  Patient presents with   Consult    Consult: Lung mass    This is a 56 year old female, past medical history of hypertension, hyperlipidemia, type 2 diabetes.  Patient had a recent CABG by Dr. Kipp Brood.  Patient had a CT scan of the chestBack in June had a chest x-ray with a nodule.  Nodular opacity still seen on chest x-ray on 07/02/2021.  Patient was discharged from the hospital ultimately came back to the ER 08/18/2021 found to have a 3.2 x 2 x 2.2 heterogenous soft tissue mass with innumerable pulmonary nodules within the lung concerning for advanced stage malignancy.  Patient was seen in the ER due to pain in her left chest.  She was referred to see me urgently for consideration of bronchoscopy and tissue biopsy.    Past Medical History:  Diagnosis Date   Anemia    has sickle cell trait   Atherosclerotic heart disease of native coronary artery with angina pectoris ([place]) 05/2012, 10/2013   a.  s/p PCTA to dRCA and ostial RPAV-PLA vessel + PDA branch (05/2012)  b. [place] s/p DES to mLAD - Resolute DES 3.0 x 22 (3.32mm -->3.3 mm)   Bipolar depression ([place])    Breast abscess    a. right side.    COPD (chronic obstructive pulmonary disease) (HCC)    Depression    Diabetic peripheral neuropathy (HCC)    Fibromyalgia    Genital warts    GERD (gastroesophageal reflux disease)    History of hiatal hernia    HLD (hyperlipidemia)    Hypertension    Pain in limb    a. LE VENOUS DUPLEX, 02/05/2009 - no evidence of deep vein thrombosis, Baker's cyst   PTSD (post-traumatic stress disorder)    Sickle cell trait (HCC)    Tobacco abuse    Tooth caries    Type II diabetes mellitus ([place])      Family History  Problem Relation Age of Onset   Esophageal cancer Mother    COPD Mother    Cancer  Mother        lymphoma   Heart disease Mother    Stomach cancer Mother    Hyperlipidemia Father    Heart disease Father    Cancer Maternal Aunt        breast, colon   Crohn's disease Maternal Aunt    Hypertension Maternal Grandmother    Diabetes Maternal Grandmother    Hypertension Brother    Rectal cancer Neg Hx    Liver cancer Neg Hx    Colon cancer Neg Hx    Breast cancer Neg Hx      Past Surgical History:  Procedure Laterality Date   BLADDER SURGERY  1980   "TVT"   BLADDER SURGERY  2003   "TVT"   BREAST EXCISIONAL BIOPSY     BREAST SURGERY Right    I&D for multiple abscesses   CARDIAC CATHETERIZATION     CORONARY ARTERY BYPASS GRAFT N/A 06/30/2021   Procedure: CORONARY ARTERY BYPASS GRAFTING (CABG) x3 USING LEFT INTERNAL MAMMARY ARTERY,  RIGHT RADIAL ARTERY AND LEFT GREATER SAPHENOUS VEIN;  Surgeon: Lajuana Matte, MD;  Location: [place];  Service: Open Heart Surgery;  Laterality: N/A;   cutting balloon     ENDOVEIN HARVEST OF GREATER SAPHENOUS VEIN Left 06/30/2021  Procedure: ENDOVEIN HARVEST OF GREATER SAPHENOUS VEIN;  Surgeon: Lajuana Matte, MD;  Location: [place];  Service: Open Heart Surgery;  Laterality: Left;   EYE SURGERY Left    laser surgery   INCISE AND DRAIN ABCESS  ; 04/26/11; 08/13/11   right breast   INCISION AND DRAINAGE ABSCESS Right 05/09/2012   Procedure: INCISION AND DRAINAGE RIGHT BREAST ABSCESS;  Surgeon: Imogene Burn. Georgette Dover, MD;  Location: [place];  Service: General;  Laterality: Right;   INCISION AND DRAINAGE ABSCESS Right 02/08/2014   Procedure: INCISION AND DRAINAGE RIGHT BREAST ABSCESS;  Surgeon: Donnie Mesa, MD;  Location: [place];  Service: General;  Laterality: Right;   INCISION AND DRAINAGE ABSCESS Right 06/14/2014   Procedure: INCISION AND DRAINAGE RIGHT BREAST ABSCESS;  Surgeon: Donnie Mesa, MD;  Location: [place];  Service: General;  Laterality: Right;   IRRIGATION AND DEBRIDEMENT ABSCESS  12/19/2010   Procedure: IRRIGATION AND  DEBRIDEMENT ABSCESS;  Surgeon: Rolm Bookbinder, MD;  Location: [place];  Service: General;  Laterality: Right;   IRRIGATION AND DEBRIDEMENT ABSCESS  08/13/2011   Procedure: MINOR INCISION AND DRAINAGE OF ABSCESS;  Surgeon: Gwenyth Ober, MD;  Location: [place];  Service: General;  Laterality: Right;  Right Breast    IRRIGATION AND DEBRIDEMENT ABSCESS  11/17/2011   Procedure: IRRIGATION AND DEBRIDEMENT ABSCESS;  Surgeon: Imogene Burn. Georgette Dover, MD;  Location: [place];  Service: General;  Laterality: Right;  irrigation and debridement right recurrent breast abscess   LARYNX SURGERY     LEFT HEART CATH AND CORONARY ANGIOGRAPHY N/A 04/09/2021   Procedure: LEFT HEART CATH AND CORONARY ANGIOGRAPHY;  Surgeon: Jettie Booze, MD;  Location: [place] [facility];  Service: Cardiovascular;  Laterality: N/A;   LEFT HEART CATHETERIZATION WITH CORONARY ANGIOGRAM N/A 05/29/2012   Procedure: LEFT HEART CATHETERIZATION WITH CORONARY ANGIOGRAM & PTCA;  Surgeon: Troy Sine, MD;  Location: [facility] CATH LAB;  Service: Cardiovascular; Bifurcation dRCA-RPAD/PLA & rPDA  PTCA.   LEFT HEART CATHETERIZATION WITH CORONARY ANGIOGRAM N/A 11/08/2013   Procedure: LEFT HEART CATHETERIZATION WITH CORONARY ANGIOGRAM and Coronary Stent Intervention;  Surgeon: Leonie Man, MD;  Location: [facility] CATH LAB;  Service: Cardiovascular; mLAD 80% (FFR 0.73) - resolute DES 3.0 x 22 mm (postdilated to 3.3 mm->3.5 mm)   NM MYOVIEW LTD  01/26/2009   Normal study, no evidence of ischemia, EF 67%   ployp removed from voice box 03/30/12     RADIAL ARTERY HARVEST Right 06/30/2021   Procedure: RADIAL ARTERY HARVEST;  Surgeon: Lajuana Matte, MD;  Location: [place];  Service: Open Heart Surgery;  Laterality: Right;   REFRACTIVE SURGERY  ~ 2010   right   TEE WITHOUT CARDIOVERSION N/A 06/30/2021   Procedure: TRANSESOPHAGEAL ECHOCARDIOGRAM (TEE);  Surgeon: Lajuana Matte, MD;  Location: [place];  Service: Open Heart Surgery;  Laterality: N/A;    TONSILLECTOMY  1988    Social History   Socioeconomic History   Marital status: Single    Spouse name: Not on file   Number of children: 1   Years of education: Not on file   Highest education level: Not on file  Occupational History   Occupation: homemaker   Occupation: disabled  Tobacco Use   Smoking status: Every Day    Packs/day: 0.50    Years: 42.00    Total pack years: 21.00    Types: Cigarettes    Start date: 01/12/1979   Smokeless tobacco: Never   Tobacco comments:    08/14/11 "  quit for 3 wk 05/2011; was smoking 1 ppd since age 64 before I quit; at least I've cut down to 1/4 ppd",  Smoking 3 per day 08/20/2021. 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, marijuana, crack cocaine)   Drug use: Yes    Comment: 08/14/11 'quit everything in 2005"   Sexual activity: Yes    Partners: Male    Birth control/protection: Condom  Other Topics Concern   Not on file  Social History Narrative   Work: disability   Children: son, 70 yrs old   Regular exercise: some/ heart attack in May/ walks 1 mile 3 days a week   Caffeine use: daily, cup of coffee daily   Social Determinants of Health   Financial Resource Strain: Not on file  Food Insecurity: Not on file  Transportation Needs: Not on file  Physical Activity: Not on file  Stress: Not on file  Social Connections: Not on file  Intimate Partner Violence: Not on file     Allergies  Allergen Reactions   Amoxicillin Hives    Has patient had a PCN reaction causing immediate rash, facial/tongue/throat swelling, SOB or lightheadedness with hypotension: Yes Has patient had a PCN reaction causing severe rash involving mucus membranes or skin necrosis: No Has patient had a PCN reaction that required hospitalization No Has patient had a PCN reaction occurring within the last 10 years:  Yes If all of the above answers are "NO", then may proceed with Cephalosporin use.   Varenicline Other (See Comments)    Nightmares - hallucinations    Buprenorphine Hcl-Naloxone Hcl Nausea And Vomiting    Other reaction(s): Vomiting     Outpatient Medications Prior to Visit  Medication Sig Dispense Refill   fluticasone (FLONASE) 50 MCG/ACT nasal spray PLACE 1 SPRAY INTO BOTH NOSTRILS DAILY AS NEEDED (CONGESTION). 16 g 1   fluticasone-salmeterol (ADVAIR) 100-50 MCG/ACT AEPB Inhale 1 puff into the lungs 2 (two) times daily. 1 each 3   ipratropium (ATROVENT HFA) 17 MCG/ACT inhaler Inhale 2 puffs into the lungs every 4 (four) hours as needed for wheezing (COPD).     ACCU-CHEK AVIVA PLUS test strip TEST BLOOD SUGAR THREE TIMES DAILY AS DIRECTED 100 each 2   amLODipine (NORVASC) 5 MG tablet Take 1 tablet (5 mg total) by mouth at bedtime. 90 tablet 3   aspirin EC 81 MG tablet Take 81 mg by mouth daily.     Aspirin-Caffeine 500-32.5 MG TABS Take 2 tablets by mouth 2 (two) times daily as needed (pain).     atorvastatin (LIPITOR) 40 MG tablet TAKE 1 TABLET (40 MG TOTAL) BY MOUTH DAILY. 90 tablet 3   busPIRone (BUSPAR) 15 MG tablet TAKE 1/2 TABLET (7.5 MG TOTAL) BY MOUTH TWICE DAILY 30 tablet 11   Carboxymethylcellul-Glycerin (LUBRICATING EYE DROPS OP) Place 1 drop into both eyes daily as needed (dry eyes).     carvedilol (COREG) 12.5 MG tablet Take 1 tablet (12.5 mg total) by mouth 2 (two) times daily with a meal. 180 tablet 1   diclofenac sodium (VOLTAREN) 1 % GEL Apply 1 application topically 3 (three) times daily as needed (pain).     diphenhydramine-acetaminophen (TYLENOL PM) 25-500 MG TABS tablet Take 1 tablet by mouth at bedtime as needed (sleep).     doxycycline (VIBRA-TABS) 100 MG tablet Take 1  tablet (100 mg total) by mouth 2 (two) times daily. 20 tablet 0   Dulaglutide (TRULICITY) 0.25 [place]/7.0WC SOPN Inject 0.75 mg into the skin once a week. 2 mL 0   ferrous sulfate 325 (65 FE) MG tablet  Take 325 mg by mouth daily with breakfast.     furosemide (LASIX) 40 MG tablet Take 1 tablet (40 mg total) by mouth daily as needed. If you gain 3 lbs in 24 hours or 5 lbs in 1 week 30 tablet 1   lidocaine (LIDODERM) 5 % Place 1 patch onto the skin daily. Remove & Discard patch within 12 hours or as directed by MD 30 patch 0   LINZESS 290 MCG CAPS capsule TAKE 1 CAPSULE (290 MCG TOTAL) BY MOUTH DAILY BEFORE BREAKFAST. (Patient taking differently: Take 290 mcg by mouth every Monday.) 30 capsule 3   methocarbamol (ROBAXIN) 500 MG tablet Take 1 tablet (500 mg total) by mouth every 8 (eight) hours as needed for muscle spasms. 20 tablet 0   nicotine polacrilex (NICORETTE) 2 MG gum Take 1 each (2 mg total) by mouth as needed for smoking cessation. 100 tablet 0   nitroGLYCERIN (NITROSTAT) 0.4 MG SL tablet Place 1 tablet (0.4 mg total) under the tongue every 5 (five) minutes as needed for chest pain. 25 tablet 5   pantoprazole (PROTONIX) 40 MG tablet Take 1 tablet (40 mg total) by mouth daily. 30 tablet 1   polyethylene glycol powder (GLYCOLAX/MIRALAX) 17 GM/SCOOP powder Take 17 g by mouth daily. Mix with 8-12 oz of water (Patient taking differently: Take 17 g by mouth daily as needed for moderate constipation. Mix with 8-12 oz of water) 3350 g 1   potassium chloride SA (KLOR-CON M) 20 MEQ tablet Take 1 tablet (20 mEq total) by mouth daily as needed. On days you take Lasix 30 tablet 1   pregabalin (LYRICA) 25 MG capsule Take 1 capsule (25 mg total) by mouth 2 (two) times daily. 60 capsule 0   sertraline (ZOLOFT) 50 MG tablet Take 1 tablet (50 mg total) by mouth daily. 30 tablet 3   sulfamethoxazole-trimethoprim (BACTRIM DS) 800-160 MG tablet Take 1 tablet by mouth 2 (two) times daily. 6 tablet 0   valACYclovir (VALTREX) 1000 MG tablet TAKE FOR 3 DAYS AS NEEDED FOR EACH OUTBREAK 30 tablet 11   vitamin B-12 (CYANOCOBALAMIN) 1000 MCG tablet Take 1,000 mcg by mouth daily.     No facility-administered medications  prior to visit.    Review of Systems  Constitutional:  Negative for chills, fever, malaise/fatigue and weight loss.  HENT:  Negative for hearing loss, sore throat and tinnitus.   Eyes:  Negative for blurred vision and double vision.  Respiratory:  Positive for shortness of breath. Negative for cough, hemoptysis, sputum production, wheezing and stridor.   Cardiovascular:  Negative for chest pain, palpitations, orthopnea, leg swelling and PND.  Gastrointestinal:  Negative for abdominal pain, constipation, diarrhea, heartburn, nausea and vomiting.  Genitourinary:  Negative for dysuria, hematuria and urgency.  Musculoskeletal:  Positive for back pain. Negative for joint pain and myalgias.  Skin:  Negative for itching and rash.  Neurological:  Negative for dizziness, tingling, weakness and headaches.  Endo/Heme/Allergies:  Negative for environmental allergies. Does not bruise/bleed easily.  Psychiatric/Behavioral:  Negative for depression. The patient is not nervous/anxious and does not have insomnia.   All other systems reviewed and are negative.    Objective:  Physical Exam Vitals reviewed.  Constitutional:      General: She  is not in acute distress.    Appearance: She is well-developed.  HENT:     Head: Normocephalic and atraumatic.  Eyes:     General: No scleral icterus.    Conjunctiva/sclera: Conjunctivae normal.     Pupils: Pupils are equal, round, and reactive to light.  Neck:     Vascular: No JVD.     Trachea: No tracheal deviation.  Cardiovascular:     Rate and Rhythm: Normal rate and regular rhythm.     Heart sounds: Normal heart sounds. No murmur heard. Pulmonary:     Effort: Pulmonary effort is normal. No tachypnea, accessory muscle usage or respiratory distress.     Breath sounds: No stridor. No wheezing, rhonchi or rales.  Abdominal:     General: There is no distension.     Palpations: Abdomen is soft.     Tenderness: There is no abdominal tenderness.   Musculoskeletal:        General: No tenderness.     Cervical back: Neck supple.  Lymphadenopathy:     Cervical: No cervical adenopathy.  Skin:    General: Skin is warm and dry.     Capillary Refill: Capillary refill takes less than 2 seconds.     Findings: No rash.  Neurological:     Mental Status: She is alert and oriented to person, place, and time.  Psychiatric:        Behavior: Behavior normal.      Vitals:   08/20/21 1044  BP: (!) 80/62  Pulse: 94  SpO2: 100%  Weight: 207 lb (93.9 kg)  Height: 5\' 7"  (1.702 m)   100% on RA BMI Readings from Last 3 Encounters:  08/20/21 32.42 kg/m  08/18/21 32.11 kg/m  07/24/21 32.11 kg/m   Wt Readings from Last 3 Encounters:  08/20/21 207 lb (93.9 kg)  08/18/21 205 lb 0.4 oz (93 kg)  07/24/21 205 lb (93 kg)     CBC    Component Value Date/Time   WBC 11.3 (H) 08/18/2021 1458   RBC 4.35 08/18/2021 1458   HGB 12.8 08/18/2021 1458   HGB 13.1 04/03/2021 1059   HCT 37.7 08/18/2021 1458   HCT 38.8 04/03/2021 1059   PLT 407 (H) 08/18/2021 1458   PLT 305 04/03/2021 1059   MCV 86.7 08/18/2021 1458   MCV 90 04/03/2021 1059   MCH 29.4 08/18/2021 1458   MCHC 34.0 08/18/2021 1458   RDW 13.2 08/18/2021 1458   RDW 12.4 04/03/2021 1059   LYMPHSABS 3.0 01/26/2021 1556   MONOABS 0.6 01/26/2021 1556   EOSABS 0.3 01/26/2021 1556   BASOSABS 0.1 01/26/2021 1556     Chest Imaging: 08/18/2021 CTA chest: Multi widespread pulmonary nodules largest in the right chest concerning for advanced bronchogenic carcinoma or other malignancy with distant metastasis. The patient's images have been independently reviewed by me.    Pulmonary Functions Testing Results:     No data to display          FeNO:   Pathology:   Echocardiogram:   Heart Catheterization:     Assessment & Plan:     ICD-10-CM   1. Lung mass  R91.8 Procedural/ Surgical Case Request: VIDEO BRONCHOSCOPY WITH ENDOBRONCHIAL ULTRASOUND    Ambulatory referral to  Pulmonology    2. Adenopathy  R59.9     3. Tobacco use  Z72.0       Discussion:  This is a 56 year old female, past medical history of tobacco use, recent CABG status post surgery.  Has a right upper lobe lung mass also with associated mediastinal adenopathy and widespread multiple innumerable pulmonary nodules within the chest concerning for malignancy.  Plan: Today in the office we discussed the risk benefits and alternatives of proceeding with bronchoscopy. Patient needs videobronchoscopy the endobronchial ultrasound transbronchial needle aspiration of the mediastinal adenopathy to confirm tissue diagnosis what appears to be an advanced stage malignancy. Patient will also need next generation sequencing, [employer] CDX blood testing. Once we get a tissue diagnosis we will get her over to see medical oncology ASAP. We will tentatively plan for bronchoscopy tomorrow morning.    Current Outpatient Medications:    fluticasone (FLONASE) 50 MCG/ACT nasal spray, PLACE 1 SPRAY INTO BOTH NOSTRILS DAILY AS NEEDED (CONGESTION)., Disp: 16 g, Rfl: 1   fluticasone-salmeterol (ADVAIR) 100-50 MCG/ACT AEPB, Inhale 1 puff into the lungs 2 (two) times daily., Disp: 1 each, Rfl: 3   ipratropium (ATROVENT HFA) 17 MCG/ACT inhaler, Inhale 2 puffs into the lungs every 4 (four) hours as needed for wheezing (COPD)., Disp: , Rfl:    ACCU-CHEK AVIVA PLUS test strip, TEST BLOOD SUGAR THREE TIMES DAILY AS DIRECTED, Disp: 100 each, Rfl: 2   amLODipine (NORVASC) 5 MG tablet, Take 1 tablet (5 mg total) by mouth at bedtime., Disp: 90 tablet, Rfl: 3   aspirin EC 81 MG tablet, Take 81 mg by mouth daily., Disp: , Rfl:    Aspirin-Caffeine 500-32.5 MG TABS, Take 2 tablets by mouth 2 (two) times daily as needed (pain)., Disp: , Rfl:    atorvastatin (LIPITOR) 40 MG tablet, TAKE 1 TABLET (40 MG TOTAL) BY MOUTH DAILY., Disp: 90 tablet, Rfl: 3   busPIRone (BUSPAR) 15 MG tablet, TAKE 1/2 TABLET (7.5 MG TOTAL) BY MOUTH TWICE  DAILY, Disp: 30 tablet, Rfl: 11   Carboxymethylcellul-Glycerin (LUBRICATING EYE DROPS OP), Place 1 drop into both eyes daily as needed (dry eyes)., Disp: , Rfl:    carvedilol (COREG) 12.5 MG tablet, Take 1 tablet (12.5 mg total) by mouth 2 (two) times daily with a meal., Disp: 180 tablet, Rfl: 1   diclofenac sodium (VOLTAREN) 1 % GEL, Apply 1 application topically 3 (three) times daily as needed (pain)., Disp: , Rfl:    diphenhydramine-acetaminophen (TYLENOL PM) 25-500 MG TABS tablet, Take 1 tablet by mouth at bedtime as needed (sleep)., Disp: , Rfl:    doxycycline (VIBRA-TABS) 100 MG tablet, Take 1 tablet (100 mg total) by mouth 2 (two) times daily., Disp: 20 tablet, Rfl: 0   Dulaglutide (TRULICITY) 6.96 EX/5.2WU SOPN, Inject 0.75 mg into the skin once a week., Disp: 2 mL, Rfl: 0   ferrous sulfate 325 (65 FE) MG tablet, Take 325 mg by mouth daily with breakfast., Disp: , Rfl:    furosemide (LASIX) 40 MG tablet, Take 1 tablet (40 mg total) by mouth daily as needed. If you gain 3 lbs in 24 hours or 5 lbs in 1 week, Disp: 30 tablet, Rfl: 1   lidocaine (LIDODERM) 5 %, Place 1 patch onto the skin daily. Remove & Discard patch within 12 hours or as directed by MD, Disp: 30 patch, Rfl: 0   LINZESS 290 MCG CAPS capsule, TAKE 1 CAPSULE (290 MCG TOTAL) BY MOUTH DAILY BEFORE BREAKFAST. (Patient taking differently: Take 290 mcg by mouth every Monday.), Disp: 30 capsule, Rfl: 3   methocarbamol (ROBAXIN) 500 MG tablet, Take 1 tablet (500 mg total) by mouth every 8 (eight) hours as needed for muscle spasms., Disp: 20 tablet, Rfl: 0   nicotine polacrilex (NICORETTE)  2 MG gum, Take 1 each (2 mg total) by mouth as needed for smoking cessation., Disp: 100 tablet, Rfl: 0   nitroGLYCERIN (NITROSTAT) 0.4 MG SL tablet, Place 1 tablet (0.4 mg total) under the tongue every 5 (five) minutes as needed for chest pain., Disp: 25 tablet, Rfl: 5   pantoprazole (PROTONIX) 40 MG tablet, Take 1 tablet (40 mg total) by mouth daily.,  Disp: 30 tablet, Rfl: 1   polyethylene glycol powder (GLYCOLAX/MIRALAX) 17 GM/SCOOP powder, Take 17 g by mouth daily. Mix with 8-12 oz of water (Patient taking differently: Take 17 g by mouth daily as needed for moderate constipation. Mix with 8-12 oz of water), Disp: 3350 g, Rfl: 1   potassium chloride SA (KLOR-CON M) 20 MEQ tablet, Take 1 tablet (20 mEq total) by mouth daily as needed. On days you take Lasix, Disp: 30 tablet, Rfl: 1   pregabalin (LYRICA) 25 MG capsule, Take 1 capsule (25 mg total) by mouth 2 (two) times daily., Disp: 60 capsule, Rfl: 0   sertraline (ZOLOFT) 50 MG tablet, Take 1 tablet (50 mg total) by mouth daily., Disp: 30 tablet, Rfl: 3   sulfamethoxazole-trimethoprim (BACTRIM DS) 800-160 MG tablet, Take 1 tablet by mouth 2 (two) times daily., Disp: 6 tablet, Rfl: 0   valACYclovir (VALTREX) 1000 MG tablet, TAKE FOR 3 DAYS AS NEEDED FOR EACH OUTBREAK, Disp: 30 tablet, Rfl: 11   vitamin B-12 (CYANOCOBALAMIN) 1000 MCG tablet, Take 1,000 mcg by mouth daily., Disp: , Rfl:   I spent 62 minutes dedicated to the care of this patient on the date of this encounter to include pre-visit review of records, face-to-face time with the patient discussing conditions above, post visit ordering of testing, clinical documentation with the electronic health record, making appropriate referrals as documented, and communicating necessary findings to members of the patients care team.   Garner Nash, DO [facility] 08/20/2021 11:25 AM

## 2021-08-21 ENCOUNTER — Ambulatory Visit (HOSPITAL_BASED_OUTPATIENT_CLINIC_OR_DEPARTMENT_OTHER): Payer: Medicaid Other | Admitting: Anesthesiology

## 2021-08-21 ENCOUNTER — Ambulatory Visit (HOSPITAL_COMMUNITY)
Admission: RE | Admit: 2021-08-21 | Discharge: 2021-08-21 | Disposition: A | Discharge status: Home / Self Care | Payer: Medicaid Other | Attending: Pulmonary Disease | Admitting: Pulmonary Disease

## 2021-08-21 ENCOUNTER — Telehealth: Payer: Self-pay | Admitting: Internal Medicine

## 2021-08-21 ENCOUNTER — Encounter (HOSPITAL_COMMUNITY): Payer: Self-pay | Admitting: Pulmonary Disease

## 2021-08-21 ENCOUNTER — Encounter (HOSPITAL_COMMUNITY)
Admission: RE | Disposition: A | Discharge status: Home / Self Care | Payer: Self-pay | Source: Home / Self Care | Attending: Pulmonary Disease

## 2021-08-21 ENCOUNTER — Ambulatory Visit (HOSPITAL_COMMUNITY): Payer: Medicaid Other | Admitting: Anesthesiology

## 2021-08-21 ENCOUNTER — Other Ambulatory Visit: Payer: Self-pay

## 2021-08-21 DIAGNOSIS — Z87891 Personal history of nicotine dependence: Secondary | ICD-10-CM | POA: Diagnosis not present

## 2021-08-21 DIAGNOSIS — F419 Anxiety disorder, unspecified: Secondary | ICD-10-CM | POA: Insufficient documentation

## 2021-08-21 DIAGNOSIS — Z951 Presence of aortocoronary bypass graft: Secondary | ICD-10-CM | POA: Diagnosis not present

## 2021-08-21 DIAGNOSIS — K219 Gastro-esophageal reflux disease without esophagitis: Secondary | ICD-10-CM | POA: Insufficient documentation

## 2021-08-21 DIAGNOSIS — R918 Other nonspecific abnormal finding of lung field: Secondary | ICD-10-CM

## 2021-08-21 DIAGNOSIS — M797 Fibromyalgia: Secondary | ICD-10-CM | POA: Diagnosis not present

## 2021-08-21 DIAGNOSIS — C771 Secondary and unspecified malignant neoplasm of intrathoracic lymph nodes: Secondary | ICD-10-CM | POA: Insufficient documentation

## 2021-08-21 DIAGNOSIS — F17210 Nicotine dependence, cigarettes, uncomplicated: Secondary | ICD-10-CM | POA: Diagnosis not present

## 2021-08-21 DIAGNOSIS — K449 Diaphragmatic hernia without obstruction or gangrene: Secondary | ICD-10-CM | POA: Diagnosis not present

## 2021-08-21 DIAGNOSIS — E1165 Type 2 diabetes mellitus with hyperglycemia: Secondary | ICD-10-CM | POA: Insufficient documentation

## 2021-08-21 DIAGNOSIS — J449 Chronic obstructive pulmonary disease, unspecified: Secondary | ICD-10-CM | POA: Diagnosis not present

## 2021-08-21 DIAGNOSIS — R590 Localized enlarged lymph nodes: Secondary | ICD-10-CM | POA: Diagnosis not present

## 2021-08-21 DIAGNOSIS — D573 Sickle-cell trait: Secondary | ICD-10-CM | POA: Diagnosis not present

## 2021-08-21 DIAGNOSIS — I10 Essential (primary) hypertension: Secondary | ICD-10-CM | POA: Diagnosis not present

## 2021-08-21 DIAGNOSIS — G709 Myoneural disorder, unspecified: Secondary | ICD-10-CM | POA: Insufficient documentation

## 2021-08-21 DIAGNOSIS — D649 Anemia, unspecified: Secondary | ICD-10-CM | POA: Insufficient documentation

## 2021-08-21 DIAGNOSIS — D759 Disease of blood and blood-forming organs, unspecified: Secondary | ICD-10-CM | POA: Insufficient documentation

## 2021-08-21 DIAGNOSIS — R599 Enlarged lymph nodes, unspecified: Secondary | ICD-10-CM | POA: Diagnosis not present

## 2021-08-21 DIAGNOSIS — I25119 Atherosclerotic heart disease of native coronary artery with unspecified angina pectoris: Secondary | ICD-10-CM | POA: Diagnosis not present

## 2021-08-21 DIAGNOSIS — F319 Bipolar disorder, unspecified: Secondary | ICD-10-CM | POA: Insufficient documentation

## 2021-08-21 DIAGNOSIS — C801 Malignant (primary) neoplasm, unspecified: Secondary | ICD-10-CM | POA: Diagnosis not present

## 2021-08-21 HISTORY — DX: Pneumonia, unspecified organism: J18.9

## 2021-08-21 HISTORY — PX: FINE NEEDLE ASPIRATION: SHX5430

## 2021-08-21 HISTORY — PX: VIDEO BRONCHOSCOPY WITH ENDOBRONCHIAL ULTRASOUND: SHX6177

## 2021-08-21 LAB — GLUCOSE, CAPILLARY
Glucose-Capillary: 177 mg/dL — ABNORMAL HIGH (ref 70–99)
Glucose-Capillary: 129 mg/dL — ABNORMAL HIGH (ref 70–99)
Glucose-Capillary: 137 mg/dL — ABNORMAL HIGH (ref 70–99)

## 2021-08-21 SURGERY — BRONCHOSCOPY, WITH EBUS
Anesthesia: General | Laterality: Bilateral

## 2021-08-21 MED ORDER — LACTATED RINGERS IV SOLN: INTRAVENOUS | Status: DC

## 2021-08-21 MED ORDER — MIDAZOLAM HCL 2 MG/2ML IJ SOLN
INTRAMUSCULAR | Status: AC
  Filled 2021-08-21: qty 2

## 2021-08-21 MED ORDER — ONDANSETRON HCL 4 MG/2ML IJ SOLN
INTRAMUSCULAR | Status: DC | PRN
  Administered 2021-08-21: 4 mg via INTRAVENOUS

## 2021-08-21 MED ORDER — ROCURONIUM BROMIDE 10 MG/ML (PF) SYRINGE
PREFILLED_SYRINGE | INTRAVENOUS | Status: DC | PRN
  Administered 2021-08-21: 60 mg via INTRAVENOUS

## 2021-08-21 MED ORDER — MIDAZOLAM HCL 2 MG/2ML IJ SOLN
2.0000 mg | Freq: Once | INTRAMUSCULAR | Status: AC
Start: 2021-08-21 — End: 2021-08-21
  Administered 2021-08-21: 2 mg via INTRAVENOUS

## 2021-08-21 MED ORDER — ACETAMINOPHEN 325 MG PO TABS: 325.0000 mg | ORAL_TABLET | ORAL | Status: DC | PRN

## 2021-08-21 MED ORDER — INSULIN ASPART 100 UNIT/ML IJ SOLN
0.0000 [IU] | INTRAMUSCULAR | Status: DC | PRN
  Administered 2021-08-21: 2 [IU] via SUBCUTANEOUS
  Filled 2021-08-21: qty 0.14
  Filled 2021-08-21: qty 1

## 2021-08-21 MED ORDER — ACETAMINOPHEN 160 MG/5ML PO SOLN: 325.0000 mg | ORAL | Status: DC | PRN

## 2021-08-21 MED ORDER — OXYCODONE HCL 5 MG/5ML PO SOLN: 5.0000 mg | Freq: Once | ORAL | Status: DC | PRN

## 2021-08-21 MED ORDER — ONDANSETRON HCL 4 MG/2ML IJ SOLN: 4.0000 mg | Freq: Once | INTRAMUSCULAR | Status: DC | PRN

## 2021-08-21 MED ORDER — MIDAZOLAM HCL 5 MG/5ML IJ SOLN
INTRAMUSCULAR | Status: DC | PRN
  Administered 2021-08-21: 1 mg via INTRAVENOUS

## 2021-08-21 MED ORDER — FENTANYL CITRATE (PF) 100 MCG/2ML IJ SOLN
INTRAMUSCULAR | Status: AC
  Filled 2021-08-21: qty 2

## 2021-08-21 MED ORDER — DEXAMETHASONE SODIUM PHOSPHATE 10 MG/ML IJ SOLN
INTRAMUSCULAR | Status: DC | PRN
  Administered 2021-08-21: 4 mg via INTRAVENOUS

## 2021-08-21 MED ORDER — FENTANYL CITRATE (PF) 100 MCG/2ML IJ SOLN: 25.0000 ug | INTRAMUSCULAR | Status: DC | PRN

## 2021-08-21 MED ORDER — FENTANYL CITRATE (PF) 250 MCG/5ML IJ SOLN
INTRAMUSCULAR | Status: DC | PRN
  Administered 2021-08-21: 50 ug via INTRAVENOUS

## 2021-08-21 MED ORDER — MEPERIDINE HCL 25 MG/ML IJ SOLN: 6.2500 mg | INTRAMUSCULAR | Status: DC | PRN

## 2021-08-21 MED ORDER — PROPOFOL 10 MG/ML IV BOLUS
INTRAVENOUS | Status: DC | PRN
  Administered 2021-08-21: 200 mg via INTRAVENOUS

## 2021-08-21 MED ORDER — OXYCODONE HCL 5 MG PO TABS: 5.0000 mg | ORAL_TABLET | Freq: Once | ORAL | Status: DC | PRN

## 2021-08-21 MED ORDER — CHLORHEXIDINE GLUCONATE 0.12 % MT SOLN
OROMUCOSAL | Status: AC
  Filled 2021-08-21: qty 15

## 2021-08-21 MED ORDER — SUGAMMADEX SODIUM 200 MG/2ML IV SOLN
INTRAVENOUS | Status: DC | PRN
  Administered 2021-08-21: 200 mg via INTRAVENOUS

## 2021-08-21 MED ORDER — LIDOCAINE 2% (20 MG/ML) 5 ML SYRINGE
INTRAMUSCULAR | Status: DC | PRN
  Administered 2021-08-21: 25 mg via INTRAVENOUS
  Administered 2021-08-21: 100 mg via INTRAVENOUS

## 2021-08-21 MED ORDER — PHENYLEPHRINE 80 MCG/ML (10ML) SYRINGE FOR IV PUSH (FOR BLOOD PRESSURE SUPPORT)
PREFILLED_SYRINGE | INTRAVENOUS | Status: DC | PRN
  Administered 2021-08-21 (×6): 160 ug via INTRAVENOUS

## 2021-08-21 MED ORDER — CHLORHEXIDINE GLUCONATE 0.12 % MT SOLN
15.0000 mL | Freq: Once | OROMUCOSAL | Status: AC
  Administered 2021-08-21: 15 mL via OROMUCOSAL

## 2021-08-21 SURGICAL SUPPLY — 30 items
BRUSH CYTOL CELLEBRITY 1.5X140 (MISCELLANEOUS) IMPLANT
CANISTER SUCT 3000ML PPV (MISCELLANEOUS) ×4 IMPLANT
CONT SPEC 4OZ CLIKSEAL STRL BL (MISCELLANEOUS) ×4 IMPLANT
COVER BACK TABLE 60X90IN (DRAPES) ×4 IMPLANT
COVER DOME SNAP 22 D (MISCELLANEOUS) ×4 IMPLANT
FORCEPS BIOP RJ4 1.8 (CUTTING FORCEPS) IMPLANT
GAUZE SPONGE 4X4 12PLY STRL (GAUZE/BANDAGES/DRESSINGS) ×4 IMPLANT
GLOVE BIO SURGEON STRL SZ7.5 (GLOVE) ×4 IMPLANT
GOWN STRL REUS W/ TWL LRG LVL3 (GOWN DISPOSABLE) ×3 IMPLANT
GOWN STRL REUS W/TWL LRG LVL3 (GOWN DISPOSABLE) ×3
KIT CLEAN ENDO COMPLIANCE (KITS) ×8 IMPLANT
KIT TURNOVER KIT B (KITS) ×4 IMPLANT
MARKER SKIN DUAL TIP RULER LAB (MISCELLANEOUS) ×4 IMPLANT
NDL EBUS SONO TIP PENTAX (NEEDLE) ×3 IMPLANT
NEEDLE EBUS SONO TIP PENTAX (NEEDLE) ×3 IMPLANT
NS IRRIG 1000ML POUR BTL (IV SOLUTION) ×4 IMPLANT
OIL SILICONE PENTAX (PARTS (SERVICE/REPAIRS)) ×4 IMPLANT
PAD ARMBOARD 7.5X6 YLW CONV (MISCELLANEOUS) ×8 IMPLANT
SOL ANTI FOG 6CC (MISCELLANEOUS) ×3 IMPLANT
SOLUTION ANTI FOG 6CC (MISCELLANEOUS) ×1
SYR 20CC LL (SYRINGE) ×8 IMPLANT
SYR 20ML ECCENTRIC (SYRINGE) ×8 IMPLANT
SYR 50ML SLIP (SYRINGE) IMPLANT
SYR 5ML LUER SLIP (SYRINGE) ×4 IMPLANT
TOWEL OR 17X24 6PK STRL BLUE (TOWEL DISPOSABLE) ×4 IMPLANT
TRAP SPECIMEN MUCOUS 40CC (MISCELLANEOUS) IMPLANT
TUBE CONNECTING 20X1/4 (TUBING) ×8 IMPLANT
UNDERPAD 30X30 (UNDERPADS AND DIAPERS) ×4 IMPLANT
VALVE DISPOSABLE (MISCELLANEOUS) ×4 IMPLANT
WATER STERILE IRR 1000ML POUR (IV SOLUTION) ×4 IMPLANT

## 2021-08-21 NOTE — Anesthesia Postprocedure Evaluation (Signed)
Anesthesia Post Note  Patient: JESSEKA DRINKARD  Procedure(s) Performed: VIDEO BRONCHOSCOPY WITH ENDOBRONCHIAL ULTRASOUND (Bilateral) FINE NEEDLE ASPIRATION (FNA) LINEAR     Patient location during evaluation: PACU Anesthesia Type: General Level of consciousness: awake and alert Pain management: pain level controlled Vital Signs Assessment: post-procedure vital signs reviewed and stable Respiratory status: spontaneous breathing, nonlabored ventilation, respiratory function stable and patient connected to nasal cannula oxygen Cardiovascular status: blood pressure returned to baseline and stable Postop Assessment: no apparent nausea or vomiting Anesthetic complications: no   No notable events documented.  Last Vitals:  Vitals:   08/21/21 1125 08/21/21 1140  BP: 104/67 116/65  Pulse: 81 78  Resp: 14 14  Temp:  36.8 C  SpO2: 93% 94%    Last Pain:  Vitals:   08/21/21 1140  TempSrc:   PainSc: 0-No pain                 Eris Hannan

## 2021-08-21 NOTE — Telephone Encounter (Signed)
Scheduled appt per 8/11 secure chat with Dr. Valeta Harms. Called pt, no answer. Left msg with appt date/time. Requested for pt to call back to confirm appt.

## 2021-08-21 NOTE — Addendum Note (Signed)
Addended by: Garner Nash on: 08/21/2021 10:54 AM   Modules accepted: Orders

## 2021-08-21 NOTE — Anesthesia Preprocedure Evaluation (Addendum)
Anesthesia Evaluation  Patient identified by MRN, date of birth, ID band Patient awake    Reviewed: Allergy & Precautions, NPO status , Patient's Chart, lab work & pertinent test results  Airway Mallampati: I  TM Distance: >3 FB Neck ROM: Full    Dental  (+) Teeth Intact, Dental Advisory Given   Pulmonary shortness of breath, COPD, Patient abstained from smoking., former smoker,    breath sounds clear to auscultation       Cardiovascular hypertension, Pt. on medications and Pt. on home beta blockers + angina + CAD and + CABG   Rhythm:Regular  1. Left ventricular ejection fraction, by estimation, is 60 to 65%. The  left ventricle has normal function. The left ventricle has no regional  wall motion abnormalities. There is mild left ventricular hypertrophy.  Left ventricular diastolic parameters  are consistent with Grade I diastolic dysfunction (impaired relaxation).  2. Right ventricular systolic function is normal. The right ventricular  size is normal.  3. The mitral valve is normal in structure. Trivial mitral valve  regurgitation. No evidence of mitral stenosis.  4. The aortic valve is tricuspid. Aortic valve regurgitation is not  visualized. No aortic stenosis is present.  5. The inferior vena cava is normal in size with greater than 50%  respiratory variability, suggesting right atrial pressure of 3 mmHg.          Neuro/Psych PSYCHIATRIC DISORDERS Anxiety Depression Bipolar Disorder  Neuromuscular disease    GI/Hepatic Neg liver ROS, hiatal hernia, GERD  Medicated,  Endo/Other  diabetes, Poorly Controlled, Type 2Morbid obesity  Renal/GU negative Renal ROS      Musculoskeletal  (+) Fibromyalgia -  Abdominal   Peds  Hematology  (+) Blood dyscrasia, Sickle cell trait and anemia ,   Anesthesia Other Findings   Reproductive/Obstetrics                            Anesthesia  Physical  Anesthesia Plan  ASA: 3  Anesthesia Plan: General   Post-op Pain Management: Minimal or no pain anticipated   Induction: Intravenous  PONV Risk Score and Plan: 2 and Ondansetron  Airway Management Planned: Oral ETT  Additional Equipment: None  Intra-op Plan:   Post-operative Plan: Extubation in OR  Informed Consent: I have reviewed the patients History and Physical, chart, labs and discussed the procedure including the risks, benefits and alternatives for the proposed anesthesia with the patient or authorized representative who has indicated his/her understanding and acceptance.     Dental advisory given  Plan Discussed with: CRNA and Anesthesiologist  Anesthesia Plan Comments: (PAT note written 06/29/2021 by Myra Gianotti, PA-C. )        Anesthesia Quick Evaluation

## 2021-08-21 NOTE — Progress Notes (Signed)
Per Dr. Ambrose Pancoast verbal order, patient received Versed 2 mg IV. Will continue to monitor.

## 2021-08-21 NOTE — Interval H&P Note (Signed)
History and Physical Interval Note:  08/21/2021 9:46 AM  Cassidy [name]  has presented today for surgery, with the diagnosis of lung mass, adenopathy.  The various methods of treatment have been discussed with the patient and family. After consideration of risks, benefits and other options for treatment, the patient has consented to  Procedure(s): Blanchard (Bilateral) as a surgical intervention.  The patient's history has been reviewed, patient examined, no change in status, stable for surgery.  I have reviewed the patient's chart and labs.  Questions were answered to the patient's satisfaction.     [place]

## 2021-08-21 NOTE — Op Note (Signed)
Video Bronchoscopy with Endobronchial Ultrasound Procedure Note  Date of Operation: 08/21/2021  Pre-op Diagnosis: Lung mass, adenopathy   Post-op Diagnosis: Lung mass, adenopathy   Surgeon: Garner Nash, DO   Assistants: None   Anesthesia: General endotracheal anesthesia  Operation: Flexible video fiberoptic bronchoscopy with endobronchial ultrasound and biopsies.  Estimated Blood Loss: Minimal  Complications: None   Indications and History: Cassidy [name] is a 56 y.o. female with lung mass, adenopathy.  The risks, benefits, complications, treatment options and expected outcomes were discussed with the patient.  The possibilities of pneumothorax, pneumonia, reaction to medication, pulmonary aspiration, perforation of a viscus, bleeding, failure to diagnose a condition and creating a complication requiring transfusion or operation were discussed with the patient who freely signed the consent.    Description of Procedure: The patient was examined in the preoperative area and history and data from the preprocedure consultation were reviewed. It was deemed appropriate to proceed.  The patient was taken to [facility], identified as Cassidy [name] and the procedure verified as Flexible Video Fiberoptic Bronchoscopy.  A Time Out was held and the above information confirmed. After being taken to the operating room general anesthesia was initiated and the patient  was orally intubated. The video fiberoptic bronchoscope was introduced via the endotracheal tube and a general inspection was performed which showed normal airways no endobronchial disease. The standard scope was then withdrawn and the endobronchial ultrasound was used to identify and characterize the peritracheal, hilar and bronchial lymph nodes. Inspection showed enlarged subcarinal adenopathy. Using real-time ultrasound guidance Wang needle biopsies were take from Station 7 nodes and were sent for cytology. The patient tolerated the  procedure well without apparent complications. There was no significant blood loss. The bronchoscope was withdrawn. Anesthesia was reversed and the patient was taken to the PACU for recovery.   Samples: 1. Wang needle biopsies from Station 7 node  Plans:  The patient will be discharged from the PACU to home when recovered from anesthesia. We will review the cytology, pathology results with the patient when they become available. Outpatient followup will be with Leory Plowman L Larrie Lucia.    Garner Nash, DO [facility] 08/21/2021 10:51 AM

## 2021-08-21 NOTE — Anesthesia Procedure Notes (Signed)
Procedure Name: Intubation Date/Time: 08/21/2021 10:05 AM  Performed by: Lowella Dell, CRNAPre-anesthesia Checklist: Patient identified, Emergency Drugs available, Suction available and Patient being monitored Patient Re-evaluated:Patient Re-evaluated prior to induction Oxygen Delivery Method: Circle System Utilized Preoxygenation: Pre-oxygenation with 100% oxygen Induction Type: IV induction Ventilation: Mask ventilation without difficulty and Oral airway inserted - appropriate to patient size Laryngoscope Size: Mac and 3 Grade View: Grade I Tube type: Oral Tube size: 8.5 mm Number of attempts: 1 Airway Equipment and Method: Stylet Placement Confirmation: ETT inserted through vocal cords under direct vision, positive ETCO2 and breath sounds checked- equal and bilateral Secured at: 21 cm Tube secured with: Tape Dental Injury: Teeth and Oropharynx as per pre-operative assessment

## 2021-08-21 NOTE — Transfer of Care (Signed)
Immediate Anesthesia Transfer of Care Note  Patient: Cassidy [name]  Procedure(s) Performed: VIDEO BRONCHOSCOPY WITH ENDOBRONCHIAL ULTRASOUND (Bilateral) FINE NEEDLE ASPIRATION (FNA) LINEAR  Patient Location: PACU  Anesthesia Type:General  Level of Consciousness: awake, alert , oriented and patient cooperative  Airway & Oxygen Therapy: Patient Spontanous Breathing and Patient connected to nasal cannula oxygen  Post-op Assessment: Report given to RN and Post -op Vital signs reviewed and stable  Post vital signs: Reviewed and stable  Last Vitals:  Vitals Value Taken Time  BP 130/72 08/21/21 1056  Temp    Pulse 80 08/21/21 1058  Resp 20 08/21/21 1058  SpO2 92 % 08/21/21 1058  Vitals shown include unvalidated device data.  Last Pain:  Vitals:   08/21/21 0756  TempSrc:   PainSc: 0-No pain         Complications: No notable events documented.

## 2021-08-21 NOTE — Discharge Instructions (Signed)
Flexible Bronchoscopy, Care After This sheet gives you information about how to care for yourself after your test. Your doctor may also give you more specific instructions. If you have problems or questions, contact your doctor. Follow these instructions at home: Eating and drinking Do not eat or drink anything (not even water) for 2 hours after your test, or until your numbing medicine (local anesthetic) wears off. When your numbness is gone and your cough and gag reflexes have come back, you may: Eat only soft foods. Slowly drink liquids. The day after the test, go back to your normal diet. Driving Do not drive for 24 hours if you were given a medicine to help you relax (sedative). Do not drive or use heavy machinery while taking prescription pain medicine. General instructions  Take over-the-counter and prescription medicines only as told by your doctor. Return to your normal activities as told. Ask what activities are safe for you. Do not use any products that have nicotine or tobacco in them. This includes cigarettes and e-cigarettes. If you need help quitting, ask your doctor. Keep all follow-up visits as told by your doctor. This is important. It is very important if you had a tissue sample (biopsy) taken. Get help right away if: You have shortness of breath that gets worse. You get light-headed. You feel like you are going to pass out (faint). You have chest pain. You cough up: More than a little blood. More blood than before. Summary Do not eat or drink anything (not even water) for 2 hours after your test, or until your numbing medicine wears off. Do not use cigarettes. Do not use e-cigarettes. Get help right away if you have chest pain.  This information is not intended to replace advice given to you by your health care provider. Make sure you discuss any questions you have with your health care provider. Document Released: 10/25/2008 Document Revised: 12/10/2016 Document  Reviewed: 01/16/2016 Elsevier Patient Education  2020 [employer].

## 2021-08-22 ENCOUNTER — Encounter (HOSPITAL_COMMUNITY): Payer: Self-pay | Admitting: Pulmonary Disease

## 2021-08-24 ENCOUNTER — Telehealth: Payer: Self-pay | Admitting: Pulmonary Disease

## 2021-08-24 ENCOUNTER — Telehealth: Payer: Self-pay | Admitting: Internal Medicine

## 2021-08-24 NOTE — Telephone Encounter (Signed)
Called and spoke with patient in regards to Dr Fabio Bering recommendations. Patient verbalized understanding. Nothing further needed

## 2021-08-24 NOTE — Telephone Encounter (Signed)
Called pt to give PET & MRI appt info.She wants BI to know she had abscess pockets in breast on the same side of lung issue and has had issues with bacteria in her body & wants to see if this has anything to do with lung CA. Also states still has pain.  Please advise sir

## 2021-08-24 NOTE — Telephone Encounter (Signed)
Called pt again today to confirm appt on 8/16. No answer. Left msg with appt date/time. Requested for pt to call back to confirm appt.

## 2021-08-25 ENCOUNTER — Other Ambulatory Visit: Payer: Self-pay | Admitting: Physician Assistant

## 2021-08-25 ENCOUNTER — Other Ambulatory Visit: Payer: Self-pay | Admitting: Family Medicine

## 2021-08-25 ENCOUNTER — Other Ambulatory Visit: Payer: Self-pay

## 2021-08-25 ENCOUNTER — Other Ambulatory Visit: Payer: Self-pay | Admitting: Pulmonary Disease

## 2021-08-25 DIAGNOSIS — R918 Other nonspecific abnormal finding of lung field: Secondary | ICD-10-CM

## 2021-08-25 LAB — CYTOLOGY - NON PAP

## 2021-08-26 ENCOUNTER — Encounter: Payer: Self-pay | Admitting: Internal Medicine

## 2021-08-26 ENCOUNTER — Other Ambulatory Visit: Payer: Self-pay

## 2021-08-26 ENCOUNTER — Inpatient Hospital Stay: Payer: Medicaid Other | Attending: Internal Medicine | Admitting: Internal Medicine

## 2021-08-26 ENCOUNTER — Inpatient Hospital Stay: Payer: Medicaid Other

## 2021-08-26 VITALS — BP 105/71 | HR 96 | Temp 97.0°F | Resp 16 | Wt 205.0 lb

## 2021-08-26 DIAGNOSIS — R059 Cough, unspecified: Secondary | ICD-10-CM | POA: Diagnosis not present

## 2021-08-26 DIAGNOSIS — I2510 Atherosclerotic heart disease of native coronary artery without angina pectoris: Secondary | ICD-10-CM | POA: Diagnosis not present

## 2021-08-26 DIAGNOSIS — R590 Localized enlarged lymph nodes: Secondary | ICD-10-CM | POA: Insufficient documentation

## 2021-08-26 DIAGNOSIS — R058 Other specified cough: Secondary | ICD-10-CM | POA: Diagnosis not present

## 2021-08-26 DIAGNOSIS — Z833 Family history of diabetes mellitus: Secondary | ICD-10-CM | POA: Insufficient documentation

## 2021-08-26 DIAGNOSIS — R5383 Other fatigue: Secondary | ICD-10-CM | POA: Insufficient documentation

## 2021-08-26 DIAGNOSIS — Z8249 Family history of ischemic heart disease and other diseases of the circulatory system: Secondary | ICD-10-CM | POA: Insufficient documentation

## 2021-08-26 DIAGNOSIS — D573 Sickle-cell trait: Secondary | ICD-10-CM | POA: Diagnosis not present

## 2021-08-26 DIAGNOSIS — R0609 Other forms of dyspnea: Secondary | ICD-10-CM | POA: Diagnosis not present

## 2021-08-26 DIAGNOSIS — Z807 Family history of other malignant neoplasms of lymphoid, hematopoietic and related tissues: Secondary | ICD-10-CM

## 2021-08-26 DIAGNOSIS — R079 Chest pain, unspecified: Secondary | ICD-10-CM | POA: Diagnosis not present

## 2021-08-26 DIAGNOSIS — R42 Dizziness and giddiness: Secondary | ICD-10-CM | POA: Insufficient documentation

## 2021-08-26 DIAGNOSIS — M797 Fibromyalgia: Secondary | ICD-10-CM | POA: Diagnosis not present

## 2021-08-26 DIAGNOSIS — J449 Chronic obstructive pulmonary disease, unspecified: Secondary | ICD-10-CM | POA: Insufficient documentation

## 2021-08-26 DIAGNOSIS — E538 Deficiency of other specified B group vitamins: Secondary | ICD-10-CM

## 2021-08-26 DIAGNOSIS — Z8379 Family history of other diseases of the digestive system: Secondary | ICD-10-CM | POA: Insufficient documentation

## 2021-08-26 DIAGNOSIS — E785 Hyperlipidemia, unspecified: Secondary | ICD-10-CM | POA: Insufficient documentation

## 2021-08-26 DIAGNOSIS — R0600 Dyspnea, unspecified: Secondary | ICD-10-CM | POA: Diagnosis not present

## 2021-08-26 DIAGNOSIS — R634 Abnormal weight loss: Secondary | ICD-10-CM | POA: Insufficient documentation

## 2021-08-26 DIAGNOSIS — R202 Paresthesia of skin: Secondary | ICD-10-CM | POA: Diagnosis not present

## 2021-08-26 DIAGNOSIS — C3411 Malignant neoplasm of upper lobe, right bronchus or lung: Secondary | ICD-10-CM | POA: Insufficient documentation

## 2021-08-26 DIAGNOSIS — E279 Disorder of adrenal gland, unspecified: Secondary | ICD-10-CM | POA: Diagnosis not present

## 2021-08-26 DIAGNOSIS — F32A Depression, unspecified: Secondary | ICD-10-CM | POA: Insufficient documentation

## 2021-08-26 DIAGNOSIS — C3491 Malignant neoplasm of unspecified part of right bronchus or lung: Secondary | ICD-10-CM

## 2021-08-26 DIAGNOSIS — I3139 Other pericardial effusion (noninflammatory): Secondary | ICD-10-CM | POA: Insufficient documentation

## 2021-08-26 DIAGNOSIS — J811 Chronic pulmonary edema: Secondary | ICD-10-CM | POA: Insufficient documentation

## 2021-08-26 DIAGNOSIS — K449 Diaphragmatic hernia without obstruction or gangrene: Secondary | ICD-10-CM | POA: Diagnosis not present

## 2021-08-26 DIAGNOSIS — I10 Essential (primary) hypertension: Secondary | ICD-10-CM | POA: Diagnosis not present

## 2021-08-26 DIAGNOSIS — R160 Hepatomegaly, not elsewhere classified: Secondary | ICD-10-CM | POA: Insufficient documentation

## 2021-08-26 DIAGNOSIS — C7951 Secondary malignant neoplasm of bone: Secondary | ICD-10-CM | POA: Insufficient documentation

## 2021-08-26 DIAGNOSIS — R918 Other nonspecific abnormal finding of lung field: Secondary | ICD-10-CM

## 2021-08-26 DIAGNOSIS — E1142 Type 2 diabetes mellitus with diabetic polyneuropathy: Secondary | ICD-10-CM | POA: Diagnosis not present

## 2021-08-26 DIAGNOSIS — Z5111 Encounter for antineoplastic chemotherapy: Secondary | ICD-10-CM | POA: Diagnosis present

## 2021-08-26 DIAGNOSIS — K219 Gastro-esophageal reflux disease without esophagitis: Secondary | ICD-10-CM | POA: Insufficient documentation

## 2021-08-26 DIAGNOSIS — Z803 Family history of malignant neoplasm of breast: Secondary | ICD-10-CM | POA: Insufficient documentation

## 2021-08-26 DIAGNOSIS — Z5112 Encounter for antineoplastic immunotherapy: Secondary | ICD-10-CM | POA: Diagnosis present

## 2021-08-26 DIAGNOSIS — C787 Secondary malignant neoplasm of liver and intrahepatic bile duct: Secondary | ICD-10-CM | POA: Insufficient documentation

## 2021-08-26 DIAGNOSIS — I700 Atherosclerosis of aorta: Secondary | ICD-10-CM | POA: Insufficient documentation

## 2021-08-26 DIAGNOSIS — Z79624 Long term (current) use of inhibitors of nucleotide synthesis: Secondary | ICD-10-CM | POA: Insufficient documentation

## 2021-08-26 DIAGNOSIS — Z880 Allergy status to penicillin: Secondary | ICD-10-CM | POA: Insufficient documentation

## 2021-08-26 DIAGNOSIS — Z87891 Personal history of nicotine dependence: Secondary | ICD-10-CM | POA: Insufficient documentation

## 2021-08-26 DIAGNOSIS — Z79899 Other long term (current) drug therapy: Secondary | ICD-10-CM | POA: Insufficient documentation

## 2021-08-26 DIAGNOSIS — Z836 Family history of other diseases of the respiratory system: Secondary | ICD-10-CM | POA: Insufficient documentation

## 2021-08-26 DIAGNOSIS — Z800 Family history of malignant neoplasm of digestive organs: Secondary | ICD-10-CM | POA: Insufficient documentation

## 2021-08-26 DIAGNOSIS — Z8349 Family history of other endocrine, nutritional and metabolic diseases: Secondary | ICD-10-CM | POA: Insufficient documentation

## 2021-08-26 DIAGNOSIS — Z8774 Personal history of (corrected) congenital malformations of heart and circulatory system: Secondary | ICD-10-CM | POA: Insufficient documentation

## 2021-08-26 HISTORY — DX: Malignant neoplasm of unspecified part of right bronchus or lung: C34.91

## 2021-08-26 LAB — CBC WITH DIFFERENTIAL (CANCER CENTER ONLY)
Abs Immature Granulocytes: 0.04 10*3/uL (ref 0.00–0.07)
Basophils Absolute: 0.1 10*3/uL (ref 0.0–0.1)
Basophils Relative: 1 %
Eosinophils Absolute: 0.5 10*3/uL (ref 0.0–0.5)
Eosinophils Relative: 5 %
HCT: 36.6 % (ref 36.0–46.0)
Hemoglobin: 12.7 g/dL (ref 12.0–15.0)
Immature Granulocytes: 0 %
Lymphocytes Relative: 29 %
Lymphs Abs: 2.9 10*3/uL (ref 0.7–4.0)
MCH: 29.6 pg (ref 26.0–34.0)
MCHC: 34.7 g/dL (ref 30.0–36.0)
MCV: 85.3 fL (ref 80.0–100.0)
Monocytes Absolute: 0.5 10*3/uL (ref 0.1–1.0)
Monocytes Relative: 5 %
Neutro Abs: 6 10*3/uL (ref 1.7–7.7)
Neutrophils Relative %: 60 %
Platelet Count: 350 10*3/uL (ref 150–400)
RBC: 4.29 MIL/uL (ref 3.87–5.11)
RDW: 13.3 % (ref 11.5–15.5)
WBC Count: 10.1 10*3/uL (ref 4.0–10.5)
nRBC: 0 % (ref 0.0–0.2)

## 2021-08-26 LAB — CMP (CANCER CENTER ONLY)
ALT: 13 U/L (ref 0–44)
AST: 11 U/L — ABNORMAL LOW (ref 15–41)
Albumin: 4.1 g/dL (ref 3.5–5.0)
Alkaline Phosphatase: 82 U/L (ref 38–126)
Anion gap: 8 (ref 5–15)
BUN: 14 mg/dL (ref 6–20)
CO2: 27 mmol/L (ref 22–32)
Calcium: 9.6 mg/dL (ref 8.9–10.3)
Chloride: 104 mmol/L (ref 98–111)
Creatinine: 0.92 mg/dL (ref 0.44–1.00)
GFR, Estimated: >60 mL/min (ref >60)
Glucose, Bld: 133 mg/dL — ABNORMAL HIGH (ref 70–99)
Potassium: 4.2 mmol/L (ref 3.5–5.1)
Sodium: 139 mmol/L (ref 135–145)
Total Bilirubin: 0.4 mg/dL (ref 0.3–1.2)
Total Protein: 7.5 g/dL (ref 6.5–8.1)

## 2021-08-26 MED ORDER — CYANOCOBALAMIN 1000 MCG/ML IJ SOLN
1000.0000 ug | Freq: Once | INTRAMUSCULAR | Status: AC
  Administered 2021-08-26: 1000 ug via INTRAMUSCULAR
  Filled 2021-08-26: qty 1

## 2021-08-26 MED ORDER — FOLIC ACID 1 MG PO TABS: 1.0000 mg | ORAL_TABLET | Freq: Every day | ORAL | 3 refills | Status: DC

## 2021-08-26 MED ORDER — PROCHLORPERAZINE MALEATE 10 MG PO TABS: 10.0000 mg | ORAL_TABLET | Freq: Four times a day (QID) | ORAL | 0 refills | Status: DC | PRN

## 2021-08-26 NOTE — Progress Notes (Signed)
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 - Non-Small Cell Lung     Cycles 1 through up to 6: A cycle is every 21 days:     Bevacizumab-xxxx      Pemetrexed      Carboplatin   **Always confirm dose/schedule in your pharmacy ordering system**  Patient Characteristics: Stage IV Metastatic, Nonsquamous, Awaiting Molecular Test Results and Need to Start Chemotherapy, PS = 0, 1 Therapeutic Status: Stage IV Metastatic Histology: Nonsquamous Cell Broad Molecular Profiling Status: Awaiting Molecular Test Results and Need to Start Chemotherapy ECOG Performance Status: 1 Intent of Therapy: Non-Curative / Palliative Intent, Discussed with Patient

## 2021-08-26 NOTE — Patient Instructions (Signed)
Vitamin B12 Deficiency Vitamin B12 deficiency occurs when the body does not have enough of this important vitamin. The body needs this vitamin: To make red blood cells. To make DNA. This is the genetic material inside cells. To help the nerves work properly so they can carry messages from the brain to the body. Vitamin B12 deficiency can cause health problems, such as not having enough red blood cells in the blood (anemia). This can lead to nerve damage if untreated. What are the causes? This condition may be caused by: Not eating enough foods that contain vitamin B12. Not having enough stomach acid and digestive fluids to properly absorb vitamin B12 from the food that you eat. Having certain diseases that make it hard to absorb vitamin B12. These diseases include Crohn's disease, chronic pancreatitis, and cystic fibrosis. An autoimmune disorder in which the body does not make enough of a protein (intrinsic factor) within the stomach, resulting in not enough absorption of vitamin B12. Having a surgery in which part of the stomach or small intestine is removed. Taking certain medicines that make it hard for the body to absorb vitamin B12. These include: Heartburn medicines, such as antacids and proton pump inhibitors. Some medicines that are used to treat diabetes. What increases the risk? The following factors may make you more likely to develop a vitamin B12 deficiency: Being an older adult. Eating a vegetarian or vegan diet that does not include any foods that come from animals. Eating a poor diet while you are pregnant. Taking certain medicines. Having alcoholism. What are the signs or symptoms? In some cases, there are no symptoms of this condition. If the condition leads to anemia or nerve damage, various symptoms may occur, such as: Weakness. Tiredness (fatigue). Loss of appetite. Numbness or tingling in your hands and feet. Redness and burning of the tongue. Depression,  confusion, or memory problems. Trouble walking. If anemia is severe, symptoms can include: Shortness of breath. Dizziness. Rapid heart rate. How is this diagnosed? This condition may be diagnosed with a blood test to measure the level of vitamin B12 in your blood. You may also have other tests, including: A group of tests that measure certain characteristics of blood cells (complete blood count, CBC). A blood test to measure intrinsic factor. A procedure where a thin tube with a camera on the end is used to look into your stomach or intestines (endoscopy). Other tests may be needed to discover the cause of the deficiency. How is this treated? Treatment for this condition depends on the cause. This condition may be treated by: Changing your eating and drinking habits, such as: Eating more foods that contain vitamin B12. Drinking less alcohol or no alcohol. Getting vitamin B12 injections. Taking vitamin B12 supplements by mouth (orally). Your health care provider will tell you which dose is best for you. Follow these instructions at home: Eating and drinking  Include foods in your diet that come from animals and contain a lot of vitamin B12. These include: Meats and poultry. This includes beef, pork, chicken, turkey, and organ meats, such as liver. Seafood. This includes clams, rainbow trout, salmon, tuna, and haddock. Eggs. Dairy foods such as milk, yogurt, and cheese. Eat foods that have vitamin B12 added to them (are fortified), such as ready-to-eat breakfast cereals. Check the label on the package to see if a food is fortified. The items listed above may not be a complete list of foods and beverages you can eat and drink. Contact a dietitian for   more information. Alcohol use Do not drink alcohol if: Your health care provider tells you not to drink. You are pregnant, may be pregnant, or are planning to become pregnant. If you drink alcohol: Limit how much you have to: 0-1 drink a  day for women. 0-2 drinks a day for men. Know how much alcohol is in your drink. In the [place] one drink equals one 12 oz bottle of beer (355 mL), one 5 oz glass of wine (148 mL), or one 1 oz glass of hard liquor (44 mL). General instructions Get vitamin B12 injections if told to by your health care provider. Take supplements only as told by your health care provider. Follow the directions carefully. Keep all follow-up visits. This is important. Contact a health care provider if: Your symptoms come back. Your symptoms get worse or do not improve with treatment. Get help right away: You develop shortness of breath. You have a rapid heart rate. You have chest pain. You become dizzy or you faint. These symptoms may be an emergency. Get help right away. Call 911. Do not wait to see if the symptoms will go away. Do not drive yourself to the hospital. Summary Vitamin B12 deficiency occurs when the body does not have enough of this important vitamin. Common causes include not eating enough foods that contain vitamin B12, not being able to absorb vitamin B12 from the food that you eat, having a surgery in which part of the stomach or small intestine is removed, or taking certain medicines. Eat foods that have vitamin B12 in them. Treatment may include making a change in the way you eat and drink, getting vitamin B12 injections, or taking vitamin B12 supplements. This information is not intended to replace advice given to you by your health care provider. Make sure you discuss any questions you have with your health care provider. Document Revised: 08/22/2020 Document Reviewed: 08/22/2020 Elsevier Patient Education  2023 [employer]  

## 2021-08-26 NOTE — Progress Notes (Signed)
[place] Telephone:([phone]) [phone]   [phone]  CONSULT NOTE  REFERRING PHYSICIAN: Dr. Leory Plowman Icard  REASON FOR CONSULTATION:  56 years old African-American female recently diagnosed with lung cancer.  HPI Cassidy [name] is a 56 y.o. female with past medical history significant for COPD, fibromyalgia, depression, diabetes mellitus, diabetic peripheral neuropathy, hypertension, dyslipidemia, sickle cell trait, hiatal hernia as well as right breast abscess and history of coronary artery disease status post CABG x3 vessels in June 2023 under the care of Dr. Kipp Brood.  The patient mentions that few weeks after her surgery she started having pain on the left side of the chest that was getting worse and she presented to the emergency department for evaluation.  She had x-ray of the ribs performed on 08/18/2021 and that showed suspicious right upper lobe lung nodule.  This was followed by CT angiogram scan of the chest on the same day and that showed 3.2 x 2.0 x 2.2 cm heterogeneous soft tissue lung mass within the right upper lobe with innumerable bilateral noncalcified lung nodules of various size.  There was also moderate to marked severity subcarinal and right hilar lymphadenopathy.  The scan also showed predominantly stable 2.3 x 2.0 cm low-attenuation right adrenal mass suspicious for adenoma. The patient was referred to Dr. Valeta Harms and on August 21, 2021 she underwent video bronchoscopy with endobronchial ultrasound procedure and biopsy.  The final pathology ([id number]) showed malignant cells consistent with adenocarcinoma from the fine-needle aspiration of the level 7 lymph node.  The neoplastic cells are diffusely and strongly positive for the pulmonary adeno marker TTF-1.  They are negative for the squamous marker p40.  The cytohistomorphology and this immunohistochemical pattern support the above diagnosis.  The tissue biopsy and blood sample were sent to [place]  for molecular studies. The patient is scheduled for a PET scan on August 28, 2021 and MRI of the the brain on September 01, 2021. Dr. Valeta Harms kindly referred the patient to me today for evaluation and recommendation regarding treatment of her condition. When seen today she continues to have pain on the right side of the chest and she was given prescription for Percocet and Flexeril by Dr. Valeta Harms.  She has a history of fibromyalgia as well as diabetic peripheral neuropathy has been on treatment with gabapentin by her primary care physician Dr. Precious Haws.  She also has shortness of breath with exertion and cough productive of whitish sputum.  She has occasional dizzy spells.  She has no nausea, vomiting, diarrhea or constipation.  She has no headache or visual changes. Family history significant for mother with history of lymphoma that was cured but this was followed by head and neck cancer.  Her father had heart disease and dyslipidemia. The patient is single and has 1 son age 80.  She worked in several jobs including housekeeping and catering.  She has a history for smoking up to 1 pack/day for around 51 years and unfortunately she continues to smoke.  She also has a history of alcohol abuse but not in the last few months.  She also has a history of smoking marijuana but denied having any other drug abuse.   HPI  Past Medical History:  Diagnosis Date   Anemia    has sickle cell trait   Atherosclerotic heart disease of native coronary artery with angina pectoris ([place]) 05/2012, 10/2013   a.  s/p PCTA to dRCA and ostial RPAV-PLA vessel + PDA branch (05/2012)  b. [place]  s/p DES to mLAD - Resolute DES 3.0 x 22 (3.81mm -->3.3 mm)   Bipolar depression (HCC)    Breast abscess    a. right side.    COPD (chronic obstructive pulmonary disease) (HCC)    Depression    Diabetic peripheral neuropathy (HCC)    Fibromyalgia    Genital warts    GERD (gastroesophageal reflux disease)    History of hiatal hernia    HLD  (hyperlipidemia)    Hypertension    Pain in limb    a. LE VENOUS DUPLEX, 02/05/2009 - no evidence of deep vein thrombosis, Baker's cyst   Pneumonia    PTSD (post-traumatic stress disorder)    Sickle cell trait ([place])    Tobacco abuse    Tooth caries    Type II diabetes mellitus ([place])     Past Surgical History:  Procedure Laterality Date   BLADDER SURGERY  1980   "TVT"   BLADDER SURGERY  2003   "TVT"   BREAST EXCISIONAL BIOPSY     BREAST SURGERY Right    I&D for multiple abscesses   CARDIAC CATHETERIZATION     CORONARY ARTERY BYPASS GRAFT N/A 06/30/2021   Procedure: CORONARY ARTERY BYPASS GRAFTING (CABG) x3 USING LEFT INTERNAL MAMMARY ARTERY,  RIGHT RADIAL ARTERY AND LEFT GREATER SAPHENOUS VEIN;  Surgeon: Lajuana Matte, MD;  Location: [facility];  Service: Open Heart Surgery;  Laterality: N/A;   cutting balloon     ENDOVEIN HARVEST OF GREATER SAPHENOUS VEIN Left 06/30/2021   Procedure: ENDOVEIN HARVEST OF GREATER SAPHENOUS VEIN;  Surgeon: Lajuana Matte, MD;  Location: [facility];  Service: Open Heart Surgery;  Laterality: Left;   EYE SURGERY Left    laser surgery   FINE NEEDLE ASPIRATION  08/21/2021   Procedure: FINE NEEDLE ASPIRATION (FNA) LINEAR;  Surgeon: Garner Nash, DO;  Location: [facility];  Service: Pulmonary;;   INCISE AND DRAIN ABCESS  ; 04/26/11; 08/13/11   right breast   INCISION AND DRAINAGE ABSCESS Right 05/09/2012   Procedure: INCISION AND DRAINAGE RIGHT BREAST ABSCESS;  Surgeon: Imogene Burn. Georgette Dover, MD;  Location: [facility];  Service: General;  Laterality: Right;   INCISION AND DRAINAGE ABSCESS Right 02/08/2014   Procedure: INCISION AND DRAINAGE RIGHT BREAST ABSCESS;  Surgeon: Donnie Mesa, MD;  Location: [facility];  Service: General;  Laterality: Right;   INCISION AND DRAINAGE ABSCESS Right 06/14/2014   Procedure: INCISION AND DRAINAGE RIGHT BREAST ABSCESS;  Surgeon: Donnie Mesa, MD;  Location: [facility];  Service: General;  Laterality: Right;   IRRIGATION AND  DEBRIDEMENT ABSCESS  12/19/2010   Procedure: IRRIGATION AND DEBRIDEMENT ABSCESS;  Surgeon: Rolm Bookbinder, MD;  Location: [facility];  Service: General;  Laterality: Right;   IRRIGATION AND DEBRIDEMENT ABSCESS  08/13/2011   Procedure: MINOR INCISION AND DRAINAGE OF ABSCESS;  Surgeon: Gwenyth Ober, MD;  Location: [facility];  Service: General;  Laterality: Right;  Right Breast    IRRIGATION AND DEBRIDEMENT ABSCESS  11/17/2011   Procedure: IRRIGATION AND DEBRIDEMENT ABSCESS;  Surgeon: Imogene Burn. Georgette Dover, MD;  Location: [facility];  Service: General;  Laterality: Right;  irrigation and debridement right recurrent breast abscess   LARYNX SURGERY     LEFT HEART CATH AND CORONARY ANGIOGRAPHY N/A 04/09/2021   Procedure: LEFT HEART CATH AND CORONARY ANGIOGRAPHY;  Surgeon: Jettie Booze, MD;  Location: [facility];  Service: Cardiovascular;  Laterality: N/A;   LEFT HEART CATHETERIZATION WITH CORONARY ANGIOGRAM N/A 05/29/2012   Procedure: LEFT HEART  CATHETERIZATION WITH CORONARY ANGIOGRAM & PTCA;  Surgeon: Troy Sine, MD;  Location: [facility];  Service: Cardiovascular; Bifurcation dRCA-RPAD/PLA & rPDA  PTCA.   LEFT HEART CATHETERIZATION WITH CORONARY ANGIOGRAM N/A 11/08/2013   Procedure: LEFT HEART CATHETERIZATION WITH CORONARY ANGIOGRAM and Coronary Stent Intervention;  Surgeon: Leonie Man, MD;  Location: [facility];  Service: Cardiovascular; mLAD 80% (FFR 0.73) - resolute DES 3.0 x 22 mm (postdilated to 3.3 mm->3.5 mm)   NM MYOVIEW LTD  01/26/2009   Normal study, no evidence of ischemia, EF 67%   ployp removed from voice box 03/30/12     RADIAL ARTERY HARVEST Right 06/30/2021   Procedure: RADIAL ARTERY HARVEST;  Surgeon: Lajuana Matte, MD;  Location: [facility];  Service: Open Heart Surgery;  Laterality: Right;   REFRACTIVE SURGERY  ~ 2010   right   TEE WITHOUT CARDIOVERSION N/A 06/30/2021   Procedure: TRANSESOPHAGEAL ECHOCARDIOGRAM (TEE);  Surgeon: Lajuana Matte, MD;  Location: [facility];  Service: Open Heart Surgery;  Laterality: N/A;   TONSILLECTOMY  1988   VIDEO BRONCHOSCOPY WITH ENDOBRONCHIAL ULTRASOUND Bilateral 08/21/2021   Procedure: VIDEO BRONCHOSCOPY WITH ENDOBRONCHIAL ULTRASOUND;  Surgeon: Garner Nash, DO;  Location: [facility];  Service: Pulmonary;  Laterality: Bilateral;    Family History  Problem Relation Age of Onset   Esophageal cancer Mother    COPD Mother    Cancer Mother        lymphoma   Heart disease Mother    Stomach cancer Mother    Hyperlipidemia Father    Heart disease Father    Cancer Maternal Aunt        breast, colon   Crohn's disease Maternal Aunt    Hypertension Maternal Grandmother    Diabetes Maternal Grandmother    Hypertension Brother    Rectal cancer Neg Hx    Liver cancer Neg Hx    Colon cancer Neg Hx    Breast cancer Neg Hx     Social History Social History   Tobacco Use   Smoking status: Former    Packs/day: 0.50    Years: 42.00    Total pack years: 21.00    Types: Cigarettes    Start date: 01/12/1979   Smokeless tobacco: Never   Tobacco comments:    08/14/11 "quit for 3 wk 05/2011; was smoking 1 ppd since age 51 before I quit; at least I've cut down to 1/4 ppd",  Smoking 3 per day 08/20/2021. 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, marijuana, crack cocaine)   Drug use: Not Currently    Comment: 08/14/11 'quit everything in 2005"    Allergies  Allergen Reactions   Amoxil [Amoxicillin] Hives    Has patient had a PCN reaction causing immediate rash, facial/tongue/throat swelling, SOB or lightheadedness with hypotension: Yes Has patient had a PCN reaction causing severe rash involving mucus membranes or skin necrosis: No Has patient had a PCN reaction that required hospitalization No Has patient had a PCN reaction occurring within the last 10 years: Yes If all of  the above answers are "NO", then may proceed with Cephalosporin use.   Chantix [Varenicline] Other (See Comments)    Nightmares Hallucinations     Suboxone [Buprenorphine Hcl-Naloxone Hcl] Nausea And Vomiting    Current Outpatient Medications  Medication Sig Dispense  Refill   ACCU-CHEK AVIVA PLUS test strip TEST BLOOD SUGAR THREE TIMES DAILY AS DIRECTED 50 each 1   amLODipine (NORVASC) 5 MG tablet Take 1 tablet (5 mg total) by mouth at bedtime. 90 tablet 3   aspirin EC 81 MG tablet Take 81 mg by mouth daily.     atorvastatin (LIPITOR) 40 MG tablet TAKE 1 TABLET (40 MG TOTAL) BY MOUTH DAILY. 90 tablet 3   busPIRone (BUSPAR) 15 MG tablet TAKE 1/2 TABLET (7.5 MG TOTAL) BY MOUTH TWICE DAILY (Patient taking differently: Take 7.5 mg by mouth 2 (two) times daily.) 30 tablet 11   Carboxymethylcellul-Glycerin (LUBRICATING EYE DROPS OP) Place 1 drop into both eyes daily as needed (dry eyes).     carvedilol (COREG) 12.5 MG tablet Take 1 tablet (12.5 mg total) by mouth 2 (two) times daily with a meal. 180 tablet 1   diclofenac sodium (VOLTAREN) 1 % GEL Apply 1 application topically 3 (three) times daily as needed (pain).     diphenhydramine-acetaminophen (TYLENOL PM) 25-500 MG TABS tablet Take 1 tablet by mouth at bedtime as needed (sleep).     doxycycline (VIBRA-TABS) 100 MG tablet Take 1 tablet (100 mg total) by mouth 2 (two) times daily. (Patient not taking: Reported on 08/21/2021) 20 tablet 0   Dulaglutide (TRULICITY) 8.12 XN/1.7GY SOPN Inject 0.75 mg into the skin once a week. (Patient taking differently: Inject 0.75 mg into the skin every Monday.) 2 mL 0   ferrous sulfate 325 (65 FE) MG tablet Take 325 mg by mouth daily with breakfast.     fluticasone (FLONASE) 50 MCG/ACT nasal spray PLACE 1 SPRAY INTO BOTH NOSTRILS DAILY AS NEEDED (CONGESTION). 16 g 1   fluticasone-salmeterol (ADVAIR) 100-50 MCG/ACT AEPB Inhale 1 puff into the lungs 2 (two) times daily. 1 each 3   furosemide (LASIX) 40 MG tablet  Take 1 tablet (40 mg total) by mouth daily as needed. If you gain 3 lbs in 24 hours or 5 lbs in 1 week 30 tablet 1   ipratropium (ATROVENT HFA) 17 MCG/ACT inhaler Inhale 2 puffs into the lungs every 4 (four) hours as needed for wheezing (COPD).     isosorbide mononitrate (IMDUR) 30 MG 24 hr tablet Take 30 mg by mouth daily. (Patient not taking: Reported on 08/21/2021)     lidocaine (LIDODERM) 5 % Place 1 patch onto the skin daily. Remove & Discard patch within 12 hours or as directed by MD (Patient not taking: Reported on 08/21/2021) 30 patch 0   LINZESS 290 MCG CAPS capsule TAKE 1 CAPSULE (290 MCG TOTAL) BY MOUTH DAILY BEFORE BREAKFAST. (Patient taking differently: Take 290 mcg by mouth every Monday.) 30 capsule 3   losartan (COZAAR) 50 MG tablet Take 50 mg by mouth daily.     methocarbamol (ROBAXIN) 500 MG tablet Take 1 tablet (500 mg total) by mouth every 8 (eight) hours as needed for muscle spasms. 20 tablet 0   nicotine polacrilex (NICORETTE) 2 MG gum Take 1 each (2 mg total) by mouth as needed for smoking cessation. 100 tablet 0   nitroGLYCERIN (NITROSTAT) 0.4 MG SL tablet Place 1 tablet (0.4 mg total) under the tongue every 5 (five) minutes as needed for chest pain. 25 tablet 5   oxyCODONE-acetaminophen (PERCOCET) 5-325 MG tablet Take 1-2 tablets by mouth every 6 (six) hours as needed for severe pain. 45 tablet 0   pantoprazole (PROTONIX) 40 MG tablet Take 1 tablet (40 mg total) by mouth daily. 30 tablet 1  potassium chloride SA (KLOR-CON M) 20 MEQ tablet Take 1 tablet (20 mEq total) by mouth daily as needed. On days you take Lasix 30 tablet 1   pregabalin (LYRICA) 25 MG capsule Take 1 capsule (25 mg total) by mouth 2 (two) times daily. (Patient not taking: Reported on 08/21/2021) 60 capsule 0   sertraline (ZOLOFT) 50 MG tablet TAKE 1 TABLET (50 MG TOTAL) BY MOUTH DAILY. 30 tablet 3   sulfamethoxazole-trimethoprim (BACTRIM DS) 800-160 MG tablet Take 1 tablet by mouth 2 (two) times daily. (Patient  not taking: Reported on 08/21/2021) 6 tablet 0   valACYclovir (VALTREX) 1000 MG tablet TAKE FOR 3 DAYS AS NEEDED FOR EACH OUTBREAK (Patient taking differently: Take 1,000 mg by mouth See admin instructions. Take 1000 mg once daily for 3 days as needed for outbreak) 30 tablet 11   vitamin B-12 (CYANOCOBALAMIN) 1000 MCG tablet Take 1,000 mcg by mouth daily.     No current facility-administered medications for this visit.    Review of Systems  Constitutional: positive for fatigue and weight loss Eyes: negative Ears, nose, mouth, throat, and face: negative Respiratory: positive for cough, dyspnea on exertion, pleurisy/chest pain, and sputum Cardiovascular: negative Gastrointestinal: negative Genitourinary:negative Integument/breast: negative Hematologic/lymphatic: negative Musculoskeletal:negative Neurological: positive for dizziness and paresthesia Behavioral/Psych: positive for depression Endocrine: negative Allergic/Immunologic: negative  Physical Exam  GDJ:MEQAS, healthy, no distress, well nourished, and well developed SKIN: skin color, texture, turgor are normal, no rashes or significant lesions HEAD: Normocephalic, No masses, lesions, tenderness or abnormalities EYES: normal, PERRLA, Conjunctiva are pink and non-injected EARS: External ears normal, Canals clear OROPHARYNX:no exudate, no erythema, and lips, buccal mucosa, and tongue normal  NECK: supple, no adenopathy, no JVD LYMPH:  no palpable lymphadenopathy, no hepatosplenomegaly BREAST:not examined LUNGS: clear to auscultation , and palpation HEART: regular rate & rhythm, no murmurs, and no gallops ABDOMEN:abdomen soft, non-tender, normal bowel sounds, and no masses or organomegaly BACK: Back symmetric, no curvature., No CVA tenderness EXTREMITIES:no joint deformities, effusion, or inflammation, no edema  NEURO: alert & oriented x 3 with fluent speech, no focal motor/sensory deficits  PERFORMANCE STATUS: ECOG  1  LABORATORY DATA: Lab Results  Component Value Date   WBC 10.1 08/26/2021   HGB 12.7 08/26/2021   HCT 36.6 08/26/2021   MCV 85.3 08/26/2021   PLT 350 08/26/2021      Chemistry      Component Value Date/Time   NA 138 08/18/2021 1458   NA 140 04/03/2021 1059   K 4.2 08/18/2021 1458   CL 105 08/18/2021 1458   CO2 23 08/18/2021 1458   BUN 10 08/18/2021 1458   BUN 13 04/03/2021 1059   CREATININE 0.85 08/18/2021 1458   CREATININE 0.65 11/06/2013 1520      Component Value Date/Time   CALCIUM 9.3 08/18/2021 1458   ALKPHOS 80 08/18/2021 1725   AST 11 (L) 08/18/2021 1725   ALT 11 08/18/2021 1725   BILITOT 0.6 08/18/2021 1725       RADIOGRAPHIC STUDIES: CT Angio Chest PE W and/or Wo Contrast  Result Date: 08/18/2021 CLINICAL DATA:  Left-sided chest pain. EXAM: CT ANGIOGRAPHY CHEST WITH CONTRAST TECHNIQUE: Multidetector CT imaging of the chest was performed using the standard protocol during bolus administration of intravenous contrast. Multiplanar CT image reconstructions and MIPs were obtained to evaluate the vascular anatomy. RADIATION DOSE REDUCTION: This exam was performed according to the departmental dose-optimization program which includes automated exposure control, adjustment of the mA and/or kV according to patient size and/or use of  iterative reconstruction technique. CONTRAST:  39mL OMNIPAQUE IOHEXOL 350 MG/ML SOLN COMPARISON:  May 26, 2012 and May 22, 2015 FINDINGS: Cardiovascular: There is mild calcification of the thoracic aorta, without evidence of aortic aneurysm. Satisfactory opacification of the pulmonary arteries to the segmental level. No evidence of pulmonary embolism. Normal heart size with marked severity coronary artery calcification. A very small, predominantly anterior pericardial effusion is noted. Mediastinum/Nodes: There is moderate to marked severity subcarinal and right hilar lymphadenopathy. Thyroid gland, trachea, and esophagus demonstrate no significant  findings. Lungs/Pleura: A 3.2 cm x 2.0 cm x 2.2 cm heterogeneous soft lung mass is seen within the right upper lobe. Innumerable bilateral noncalcified lung nodules of various sizes are also noted. There is no evidence of acute infiltrate, pleural effusion or pneumothorax. Upper Abdomen: There is a small hiatal hernia. A predominantly stable 2.3 cm x 2.0 cm low-attenuation (approximately 29.65 Hounsfield units) right adrenal mass is seen. Musculoskeletal: Multiple sternal wires are present. No acute osseous abnormalities are identified. Review of the MIP images confirms the above findings. IMPRESSION: 1. No evidence of pulmonary embolism. 2. 3.2 cm x 2.0 cm x 2.2 cm heterogeneous soft lung mass within the right upper lobe with innumerable bilateral noncalcified lung nodules of various sizes, consistent with metastatic disease. 3. Moderate to marked severity subcarinal and right hilar lymphadenopathy. 4. Stable right adrenal mass likely consistent with an adrenal adenoma. 5. Small hiatal hernia. Aortic Atherosclerosis (ICD10-I70.0). Electronically Signed   By: Virgina Norfolk M.D.   On: 08/18/2021 21:05   DG Ribs Unilateral W/Chest Left  Result Date: 08/18/2021 CLINICAL DATA:  Left-sided chest pain and shortness of breath since Sunday. EXAM: LEFT RIBS AND CHEST - 3+ VIEW COMPARISON:  07/02/2021 FINDINGS: Frontal view of the chest and two views of left ribs. The frontal view of the chest demonstrates removal of right IJ line. Median sternotomy for CABG. Midline trachea. Normal heart size. No pleural effusion or pneumothorax. Improvement in interstitial edema with mild pulmonary venous congestion remaining. Right upper lobe nodular density again identified and has been detailed on multiple prior radiographs, including 06/26/2021. Left rib radiographs demonstrate no displaced fracture or callus deposition. IMPRESSION: No displaced rib fracture, pleural fluid, or pneumothorax. Mild pulmonary venous congestion in the  setting of recent CABG. Right upper lobe lung nodular density, as detailed on multiple prior radiographs. I personally called and discussed this report with Dr. Alvino Chapel. At 16:02 on 08/18/2021. Electronically Signed   By: Abigail Miyamoto M.D.   On: 08/18/2021 16:17    ASSESSMENT: This is a very pleasant 56 years old African-American female recently diagnosed with a stage IV (T2a, N2, M1 a) non-small cell lung cancer, adenocarcinoma presented with right upper lobe lung mass in addition to right hilar and mediastinal lymphadenopathy and innumerable bilateral pulmonary nodules diagnosed in August 2023.  Pending staging work-up.   PLAN: I had a lengthy discussion with the patient today about her current disease stage, prognosis and treatment options. I personally and independently reviewed the scan images and discussed the result and showed the images to the patient today. I recommended for the patient to complete the staging work-up and she already have a PET scan and MRI of the brain is scheduled to be done later this week and early next week. I explained to the patient that she has incurable condition and all the treatment will be of palliative nature. Her tissue biopsy as well as the blood test was sent to [place] for molecular studies and these are still  pending. I explained to the patient if she has an actionable mutation, we will treat her with targeted therapy but in the absence of actionable mutations, she may benefit from a combination of systemic chemotherapy with immunotherapy.  She is interested in treatment. I would arrange for the patient to start systemic chemotherapy with carboplatin for AUC of 5, Alimta 500 Mg/M2 and Keytruda 200 Mg IV every 3 weeks on September 02, 2021 if the molecular studies showed no actionable mutations. I discussed with the patient the adverse effect of this treatment including but not limited to alopecia, myelosuppression, nausea and vomiting, peripheral neuropathy,  liver or renal dysfunction as well as immunotherapy adverse effects. I will arrange for the patient to have a chemotherapy education class before the first dose of her treatment. She will also receive vitamin B12 injection today. I will call her pharmacy with prescription for folic acid 1 mg p.o. daily and Compazine 10 mg p.o. every 6 hours as needed for nausea. The patient will come back for follow-up visit with the start of the first cycle of her treatment. If the molecular studies showed actionable mutation, I will cancel her chemotherapy and start the patient on targeted therapy. For pain management and peripheral neuropathy, she will continue to receive her medication from her primary care physician. She was advised to call immediately if she has any other concerning symptoms in the interval. The patient voices understanding of current disease status and treatment options and is in agreement with the current care plan.  All questions were answered. The patient knows to call the clinic with any problems, questions or concerns. We can certainly see the patient much sooner if necessary.  Thank you so much for allowing me to participate in the care of YVONNA BRUN. I will continue to follow up the patient with you and assist in her care.  The total time spent in the appointment was 90 minutes.  Disclaimer: This note was dictated with voice recognition software. Similar sounding words can inadvertently be transcribed and may not be corrected upon review.   Eilleen Kempf August 26, 2021, 2:43 PM

## 2021-08-27 ENCOUNTER — Telehealth: Payer: Self-pay | Admitting: Internal Medicine

## 2021-08-27 ENCOUNTER — Other Ambulatory Visit: Payer: Self-pay

## 2021-08-27 NOTE — Telephone Encounter (Signed)
Contacted patient to scheduled appointments. Left message with appointment details and a call back number if patient had any questions or could not accommodate the time we provided.   

## 2021-08-28 ENCOUNTER — Ambulatory Visit (HOSPITAL_COMMUNITY)
Admission: RE | Admit: 2021-08-28 | Discharge: 2021-08-28 | Disposition: A | Discharge status: Home / Self Care | Payer: Medicaid Other | Source: Ambulatory Visit | Attending: Pulmonary Disease | Admitting: Pulmonary Disease

## 2021-08-28 DIAGNOSIS — C7951 Secondary malignant neoplasm of bone: Secondary | ICD-10-CM | POA: Diagnosis not present

## 2021-08-28 DIAGNOSIS — C3490 Malignant neoplasm of unspecified part of unspecified bronchus or lung: Secondary | ICD-10-CM | POA: Insufficient documentation

## 2021-08-28 DIAGNOSIS — C3491 Malignant neoplasm of unspecified part of right bronchus or lung: Secondary | ICD-10-CM | POA: Diagnosis not present

## 2021-08-28 LAB — HM DIABETES EYE EXAM

## 2021-08-28 LAB — GLUCOSE, CAPILLARY: Glucose-Capillary: 149 mg/dL — ABNORMAL HIGH (ref 70–99)

## 2021-08-28 MED ORDER — FLUDEOXYGLUCOSE F - 18 (FDG) INJECTION
10.2000 | Freq: Once | INTRAVENOUS | Status: AC
  Administered 2021-08-28: 10.1 via INTRAVENOUS

## 2021-08-28 NOTE — Progress Notes (Unsigned)
Cardiology Office Note:   Date:  08/31/2021  NAME:  Cassidy [name]    MRN: [id number] DOB:  09-Jun-1965   PCP:  [name], DO  Cardiologist:  Evalina Field, MD  Electrophysiologist:  None   Referring MD: [name], DO   Chief Complaint  Patient presents with   Follow-up    History of Present Illness:   Cassidy [name] is a 56 y.o. female with a hx of CAD status post CABG, diabetes, hypertension, tobacco abuse who presents for follow-up.  Underwent CABG surgery in June 2023.  Seems to be doing well.  Recently diagnosed with what appears to be lung adenocarcinoma.  She reports for the last 3 months she has been exquisitely tender underneath her left breast.  She has tenderness to palpation in the muscle and rib space.  Symptoms have improved with Voltaren gel.  She is also been given muscle relaxers.  Symptoms are likely consistent with costochondritis.  She has not tried high-dose anti-inflammatories.  I do not believe this represents pericarditis we will check inflammatory markers.  Her blood pressure is well controlled.  She is back on losartan.  She denies any exertional chest pain or pressure.  She does have a cough.  She has been diagnosed with stage IV lung adenocarcinoma.  No fevers or chills.  She will start chemotherapy next week.  Cholesterol level is at goal.  Blood pressure well controlled.  She has quit smoking.  This is reassuring.  Problem List CAD -2014: POBA dRCA/PDA/PLV -2015: PCI to mid LAD  -06/30/2021 -> CABG x 3 (LIMA-LAD, SVG-dRCA, SVG-OM) 2. HLD -T chol 131, HDL 60, LDL 56, triglycerides 77 3. DM -A1c 6.8 4. HTN 5. Tobacco abuse  -40 pack years 6. Lung adenocarcinoma, Stage IV -Dx 08/18/2021  Past Medical History: Past Medical History:  Diagnosis Date   Adenocarcinoma of right lung, stage 4 ([place]) 08/26/2021   Anemia    has sickle cell trait   Atherosclerotic heart disease of native coronary artery with angina pectoris ([place]) 05/2012,  10/2013   a.  s/p PCTA to dRCA and ostial RPAV-PLA vessel + PDA branch (05/2012)  b. [place] s/p DES to mLAD - Resolute DES 3.0 x 22 (3.2m -->3.3 mm)   Bipolar depression ([place]    Breast abscess    a. right side.    COPD (chronic obstructive pulmonary disease) (HCC)    Depression    Diabetic peripheral neuropathy (H[facility])    Fibromyalgia    Genital warts    GERD (gastroesophageal reflux disease)    History of hiatal hernia    HLD (hyperlipidemia)    Hypertension    Pain in limb    a. LE VENOUS DUPLEX, 02/05/2009 - no evidence of deep vein thrombosis, Baker's cyst   Pneumonia    PTSD (post-traumatic stress disorder)    Sickle cell trait ([facility]    Tobacco abuse    Tooth caries    Type II diabetes mellitus ([facility]     Past Surgical History: Past Surgical History:  Procedure Laterality Date   BLADDER SURGERY  1980   "TVT"   BLADDER SURGERY  2003   "TVT"   BREAST EXCISIONAL BIOPSY     BREAST SURGERY Right    I&D for multiple abscesses   CARDIAC CATHETERIZATION     CORONARY ARTERY BYPASS GRAFT N/A 06/30/2021   Procedure: CORONARY ARTERY BYPASS GRAFTING (CABG) x3 USING LEFT INTERNAL MAMMARY ARTERY,  RIGHT RADIAL ARTERY AND LEFT GREATER SAPHENOUS VEIN;  Surgeon: Lajuana Matte, MD;  Location: [facility];  Service: Open Heart Surgery;  Laterality: N/A;   cutting balloon     ENDOVEIN HARVEST OF GREATER SAPHENOUS VEIN Left 06/30/2021   Procedure: ENDOVEIN HARVEST OF GREATER SAPHENOUS VEIN;  Surgeon: Lajuana Matte, MD;  Location: [facility];  Service: Open Heart Surgery;  Laterality: Left;   EYE SURGERY Left    laser surgery   FINE NEEDLE ASPIRATION  08/21/2021   Procedure: FINE NEEDLE ASPIRATION (FNA) LINEAR;  Surgeon: Garner Nash, DO;  Location: [facility];  Service: Pulmonary;;   INCISE AND DRAIN ABCESS  ; 04/26/11; 08/13/11   right breast   INCISION AND DRAINAGE ABSCESS Right 05/09/2012   Procedure: INCISION AND DRAINAGE RIGHT BREAST ABSCESS;  Surgeon: Imogene Burn. Georgette Dover, MD;  Location:  [facility];  Service: General;  Laterality: Right;   INCISION AND DRAINAGE ABSCESS Right 02/08/2014   Procedure: INCISION AND DRAINAGE RIGHT BREAST ABSCESS;  Surgeon: Donnie Mesa, MD;  Location: [facility];  Service: General;  Laterality: Right;   INCISION AND DRAINAGE ABSCESS Right 06/14/2014   Procedure: INCISION AND DRAINAGE RIGHT BREAST ABSCESS;  Surgeon: Donnie Mesa, MD;  Location: [facility];  Service: General;  Laterality: Right;   IRRIGATION AND DEBRIDEMENT ABSCESS  12/19/2010   Procedure: IRRIGATION AND DEBRIDEMENT ABSCESS;  Surgeon: Rolm Bookbinder, MD;  Location: [facility];  Service: General;  Laterality: Right;   IRRIGATION AND DEBRIDEMENT ABSCESS  08/13/2011   Procedure: MINOR INCISION AND DRAINAGE OF ABSCESS;  Surgeon: Gwenyth Ober, MD;  Location: [facility];  Service: General;  Laterality: Right;  Right Breast    IRRIGATION AND DEBRIDEMENT ABSCESS  11/17/2011   Procedure: IRRIGATION AND DEBRIDEMENT ABSCESS;  Surgeon: Imogene Burn. Georgette Dover, MD;  Location: [facility];  Service: General;  Laterality: Right;  irrigation and debridement right recurrent breast abscess   LARYNX SURGERY     LEFT HEART CATH AND CORONARY ANGIOGRAPHY N/A 04/09/2021   Procedure: LEFT HEART CATH AND CORONARY ANGIOGRAPHY;  Surgeon: Jettie Booze, MD;  Location: [place] [facility];  Service: Cardiovascular;  Laterality: N/A;   LEFT HEART CATHETERIZATION WITH CORONARY ANGIOGRAM N/A 05/29/2012   Procedure: LEFT HEART CATHETERIZATION WITH CORONARY ANGIOGRAM & PTCA;  Surgeon: Troy Sine, MD;  Location[facility];  Service: Cardiovascular; Bifurcation dRCA-RPAD/PLA & rPDA  PTCA.   LEFT HEART CATHETERIZATION WITH CORONARY ANGIOGRAM N/A 11/08/2013   Procedure: LEFT HEART CATHETERIZATION WITH CORONARY ANGIOGRAM and Coronary Stent Intervention;  Surgeon: Leonie Man, MD;  Location: [facility] LAB;  Service: Cardiovascular; mLAD 80% (FFR 0.73) - resolute DES 3.0 x 22 mm (postdilated to 3.3 mm->3.5 mm)   NM MYOVIEW LTD  01/26/2009    Normal study, no evidence of ischemia, EF 67%   ployp removed from voice box 03/30/12     RADIAL ARTERY HARVEST Right 06/30/2021   Procedure: RADIAL ARTERY HARVEST;  Surgeon: Lajuana Matte, MD;  Location: [place];  Service: Open Heart Surgery;  Laterality: Right;   REFRACTIVE SURGERY  ~ 2010   right   TEE WITHOUT CARDIOVERSION N/A 06/30/2021   Procedure: TRANSESOPHAGEAL ECHOCARDIOGRAM (TEE);  Surgeon: Lajuana Matte, MD;  Location: [place];  Service: Open Heart Surgery;  Laterality: N/A;   TONSILLECTOMY  1988   VIDEO BRONCHOSCOPY WITH ENDOBRONCHIAL ULTRASOUND Bilateral 08/21/2021   Procedure: VIDEO BRONCHOSCOPY WITH ENDOBRONCHIAL ULTRASOUND;  Surgeon: Garner Nash, DO;  Location: [place];  Service: Pulmonary;  Laterality: Bilateral;    Current Medications: Current Meds  Medication  Sig   ACCU-CHEK AVIVA PLUS test strip TEST BLOOD SUGAR THREE TIMES DAILY AS DIRECTED   amLODipine (NORVASC) 5 MG tablet Take 1 tablet (5 mg total) by mouth at bedtime.   aspirin EC 325 MG tablet Take 2 tablets (650 mg total) by mouth in the morning, at noon, and at bedtime for 7 days.   aspirin EC 81 MG tablet Take 81 mg by mouth daily.   atorvastatin (LIPITOR) 40 MG tablet TAKE 1 TABLET (40 MG TOTAL) BY MOUTH DAILY.   busPIRone (BUSPAR) 15 MG tablet TAKE 1/2 TABLET (7.5 MG TOTAL) BY MOUTH TWICE DAILY (Patient taking differently: Take 7.5 mg by mouth 2 (two) times daily.)   Carboxymethylcellul-Glycerin (LUBRICATING EYE DROPS OP) Place 1 drop into both eyes daily as needed (dry eyes).   carvedilol (COREG) 12.5 MG tablet Take 1 tablet (12.5 mg total) by mouth 2 (two) times daily with a meal.   diclofenac sodium (VOLTAREN) 1 % GEL Apply 1 application topically 3 (three) times daily as needed (pain).   diphenhydramine-acetaminophen (TYLENOL PM) 25-500 MG TABS tablet Take 1 tablet by mouth at bedtime as needed (sleep).   Dulaglutide (TRULICITY) 2.87 GO/1.1XB SOPN Inject 0.75 mg into the skin once a week.  (Patient taking differently: Inject 0.75 mg into the skin every Monday.)   ferrous sulfate 325 (65 FE) MG tablet Take 325 mg by mouth daily with breakfast.   fluticasone (FLONASE) 50 MCG/ACT nasal spray PLACE 1 SPRAY INTO BOTH NOSTRILS DAILY AS NEEDED (CONGESTION).   fluticasone-salmeterol (ADVAIR) 100-50 MCG/ACT AEPB Inhale 1 puff into the lungs 2 (two) times daily.   folic acid (FOLVITE) 1 MG tablet Take 1 tablet (1 mg total) by mouth daily.   furosemide (LASIX) 40 MG tablet Take 1 tablet (40 mg total) by mouth daily as needed. If you gain 3 lbs in 24 hours or 5 lbs in 1 week   ipratropium (ATROVENT HFA) 17 MCG/ACT inhaler Inhale 2 puffs into the lungs every 4 (four) hours as needed for wheezing (COPD).   LINZESS 290 MCG CAPS capsule TAKE 1 CAPSULE (290 MCG TOTAL) BY MOUTH DAILY BEFORE BREAKFAST. (Patient taking differently: Take 290 mcg by mouth every Monday.)   losartan (COZAAR) 50 MG tablet Take 50 mg by mouth daily.   methocarbamol (ROBAXIN) 500 MG tablet Take 1 tablet (500 mg total) by mouth every 8 (eight) hours as needed for muscle spasms.   nicotine polacrilex (NICORETTE) 2 MG gum Take 1 each (2 mg total) by mouth as needed for smoking cessation.   nitroGLYCERIN (NITROSTAT) 0.4 MG SL tablet Place 1 tablet (0.4 mg total) under the tongue every 5 (five) minutes as needed for chest pain.   oxyCODONE-acetaminophen (PERCOCET) 5-325 MG tablet Take 1-2 tablets by mouth every 6 (six) hours as needed for severe pain.   pantoprazole (PROTONIX) 40 MG tablet Take 1 tablet (40 mg total) by mouth daily.   potassium chloride SA (KLOR-CON M) 20 MEQ tablet Take 1 tablet (20 mEq total) by mouth daily as needed. On days you take Lasix   prochlorperazine (COMPAZINE) 10 MG tablet Take 1 tablet (10 mg total) by mouth every 6 (six) hours as needed for nausea or vomiting.   sertraline (ZOLOFT) 50 MG tablet TAKE 1 TABLET (50 MG TOTAL) BY MOUTH DAILY.   valACYclovir (VALTREX) 1000 MG tablet TAKE FOR 3 DAYS AS  NEEDED FOR EACH OUTBREAK   vitamin B-12 (CYANOCOBALAMIN) 1000 MCG tablet Take 1,000 mcg by mouth daily.     Allergies:  Amoxil [amoxicillin], Chantix [varenicline], and Suboxone [buprenorphine hcl-naloxone hcl]   Social History: Social History   Socioeconomic History   Marital status: Single    Spouse name: Not on file   Number of children: 1   Years of education: Not on file   Highest education level: Not on file  Occupational History   Occupation: homemaker   Occupation: disabled  Tobacco Use   Smoking status: Former    Packs/day: 0.50    Years: 42.00    Total pack years: 21.00    Types: Cigarettes    Start date: 01/12/1979   Smokeless tobacco: Never   Tobacco comments:    08/14/11 "quit for 3 wk 05/2011; was smoking 1 ppd since age 34 before I quit; at least I've cut down to 1/4 ppd",  Smoking 3 per day 08/20/2021. 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, marijuana, crack cocaine)   Drug use: Not Currently    Comment: 08/14/11 'quit everything in 2005"   Sexual activity: Yes    Partners: Male    Birth control/protection: Condom  Other Topics Concern   Not on file  Social History Narrative   Work: disability   Children: son, 33 yrs old   Regular exercise: some/ heart attack in May/ walks 1 mile 3 days a week   Caffeine use: daily, cup of coffee daily   Social Determinants of Health   Financial Resource Strain: Not on file  Food Insecurity: Not on file  Transportation Needs: Not on file  Physical Activity: Not on file  Stress: Not on file  Social Connections: Not on file     Family History: The patient's family history includes COPD in her mother; Cancer in her maternal aunt and mother; Crohn's disease in her maternal aunt; Diabetes in her maternal grandmother; Esophageal cancer in her mother; Heart disease in  her father and mother; Hyperlipidemia in her father; Hypertension in her brother and maternal grandmother; Stomach cancer in her mother. There is no history of Rectal cancer, Liver cancer, Colon cancer, or Breast cancer.  ROS:   All other ROS reviewed and negative. Pertinent positives noted in the HPI.     EKGs/Labs/Other Studies Reviewed:   The following studies were personally reviewed by me today:  EKG:  EKG reviewed dated 08/18/2021.  That EKG demonstrates sinus rhythm with anterior infarct TTE 04/09/2021   1. Left ventricular ejection fraction, by estimation, is 60 to 65%. The  left ventricle has normal function. The left ventricle has no regional  wall motion abnormalities. There is mild left ventricular hypertrophy.  Left ventricular diastolic parameters  are consistent with Grade I diastolic dysfunction (impaired relaxation).   2. Right ventricular systolic function is normal. The right ventricular  size is normal.   3. The mitral valve is normal in structure. Trivial mitral valve  regurgitation. No evidence of mitral stenosis.   4. The aortic valve is tricuspid. Aortic valve regurgitation is not  visualized. No aortic stenosis is present.   5. The inferior vena cava is normal in size with greater than 50%  respiratory variability, suggesting right atrial pressure of 3 mmHg.   LHC 04/09/2021   Mid RCA to Dist RCA lesion is 80% stenosed.   Prox RCA to Mid RCA lesion is 50% stenosed.   3rd Mrg lesion is 80%  stenosed.   Prox Cx to Mid Cx lesion is 75% stenosed.   Prox Cx lesion is 75% stenosed.   Ost LAD to Prox LAD lesion is 40% stenosed.   Mid LAD-2 lesion is 80% stenosed.   2nd Mrg lesion is 75% stenosed.   Previously placed Mid LAD-1 stent (unknown type) is  widely patent.   The left ventricular systolic function is normal.   LV end diastolic pressure is normal.   The left ventricular ejection fraction is 55-65% by visual estimate.   There is no aortic valve  stenosis.  Recent Labs: 07/01/2021: Magnesium 2.6 08/26/2021: ALT 13; BUN 14; Creatinine 0.92; Hemoglobin 12.7; Platelet Count 350; Potassium 4.2; Sodium 139   Recent Lipid Panel    Component Value Date/Time   CHOL 131 05/15/2021 1615   CHOL 132 08/07/2012 0905   TRIG 77 05/15/2021 1615   TRIG 73 08/07/2012 0905   HDL 60 05/15/2021 1615   HDL 42 08/07/2012 0905   CHOLHDL 2.2 05/15/2021 1615   CHOLHDL 4 11/29/2014 1140   VLDL 23.8 11/29/2014 1140   LDLCALC 56 05/15/2021 1615   LDLCALC 75 08/07/2012 0905    Physical Exam:   VS:  BP 108/68   Pulse 86   Ht 5' 7.5" (1.715 m)   Wt 205 lb 12.8 oz (93.4 kg)   LMP 06/12/2016 (Approximate)   SpO2 98%   BMI 31.76 kg/m    Wt Readings from Last 3 Encounters:  08/31/21 205 lb 12.8 oz (93.4 kg)  08/26/21 205 lb (93 kg)  08/21/21 204 lb (92.5 kg)    General: Well nourished, well developed, in no acute distress Head: Atraumatic, normal size  Eyes: PEERLA, EOMI  Neck: Supple, no JVD Endocrine: No thryomegaly Cardiac: Normal S1, S2; RRR; no murmurs, rubs, or gallops Lungs: Clear to auscultation bilaterally, no wheezing, rhonchi or rales  Abd: Soft, nontender, no hepatomegaly  Ext: No edema, pulses 2+ Musculoskeletal: No deformities, tenderness to palpation in the rib space and ribs below the left breast Skin: Warm and dry, no rashes   Neuro: Alert and oriented to person, place, time, and situation, CNII-XII grossly intact, no focal deficits  Psych: Normal mood and affect   ASSESSMENT:   Cassidy [name] is a 56 y.o. female who presents for the following: 1. Costochondritis   2. Coronary artery disease involving native coronary artery of native heart without angina pectoris   3. Hyperlipidemia LDL goal <70   4. Essential hypertension   5. Tobacco abuse     PLAN:   1. Costochondritis -Symptoms are likely consistent with costochondritis.  We will try 650 mg of aspirin 3 times daily for 7 days.  She will take Protonix with this.   She will then resume her baby aspirin after she takes his course of aspirin.  She should hold her baby aspirin while on this.  We will also give her prescription for Voltaren gel.  To me this is all musculoskeletal and not heart related.  We will check ESR and CRP to exclude any post cardiotomy syndrome.  2. Coronary artery disease involving native coronary artery of native heart without angina pectoris 3. Hyperlipidemia LDL goal <70 -Status post CABG.  Doing well.  LDL cholesterol at goal.  She has quit smoking.  Diabetes is at goal.  She will continue with her blood pressure medications.  Unfortunately has been diagnosed with metastatic lung cancer.  She will start treatment for this next week.  4. Essential hypertension -Continue current  medications.  Stop Imdur.  No need for this.  Continue carvedilol 12.5 mg twice daily, losartan 50 mg daily.  She takes Lasix as needed.  5. Tobacco abuse -She has stopped smoking.  I encouraged her on this.  Disposition: Return in about 3 months (around 12/01/2021).  Medication Adjustments/Labs and Tests Ordered: Current medicines are reviewed at length with the patient today.  Concerns regarding medicines are outlined above.  Orders Placed This Encounter  Procedures   Sedimentation rate   C-reactive protein   Meds ordered this encounter  Medications   aspirin EC 325 MG tablet    Sig: Take 2 tablets (650 mg total) by mouth in the morning, at noon, and at bedtime for 7 days.    Dispense:  42 tablet    Refill:  0    Patient Instructions  Medication Instructions:  Take Aspirin 650 mg three times daily for 7 days. Take Protonix during this period of time.   Restart your Aspirin 81 mg after the 7 days.   *If you need a refill on your cardiac medications before your next appointment, please call your pharmacy*   Lab Work: CRP, ESR today   If you have labs (blood work) drawn today and your tests are completely normal, you will receive your  results only by: [place] (if you have MyChart) OR A paper copy in the mail If you have any lab test that is abnormal or we need to change your treatment, we will call you to review the results.   Follow-Up: At [facility], you and your health needs are our priority.  As part of our continuing mission to provide you with exceptional heart care, we have created designated Provider Care Teams.  These Care Teams include your primary Cardiologist (physician) and Advanced Practice Providers (APPs -  Physician Assistants and Nurse Practitioners) who all work together to provide you with the care you need, when you need it.  We recommend signing up for the patient portal called "MyChart".  Sign up information is provided on this After Visit Summary.  MyChart is used to connect with patients for Virtual Visits (Telemedicine).  Patients are able to view lab/test results, encounter notes, upcoming appointments, etc.  Non-urgent messages can be sent to your provider as well.   To learn more about what you can do with MyChart, go to [email or web address].    Your next appointment:   3 month(s)  The format for your next appointment:   In Person  Provider:   Evalina Field, MD              Time Spent with Patient: I have spent a total of 35 minutes with patient reviewing hospital notes, telemetry, EKGs, labs and examining the patient as well as establishing an assessment and plan that was discussed with the patient.  > 50% of time was spent in direct patient care.  Signed, Addison Naegeli. Audie Box, MD, [place]  [address] [place],  [address] [phone]  08/31/2021 10:07 AM

## 2021-08-31 ENCOUNTER — Ambulatory Visit (INDEPENDENT_AMBULATORY_CARE_PROVIDER_SITE_OTHER): Payer: Medicaid Other | Admitting: Cardiovascular Disease

## 2021-08-31 ENCOUNTER — Encounter: Payer: Self-pay | Admitting: Cardiovascular Disease

## 2021-08-31 VITALS — BP 108/68 | HR 86 | Ht 67.5 in | Wt 205.8 lb

## 2021-08-31 DIAGNOSIS — M940 Chondrocostal junction syndrome [Tietze]: Secondary | ICD-10-CM

## 2021-08-31 DIAGNOSIS — I2510 Atherosclerotic heart disease of native coronary artery without angina pectoris: Secondary | ICD-10-CM | POA: Diagnosis not present

## 2021-08-31 DIAGNOSIS — E785 Hyperlipidemia, unspecified: Secondary | ICD-10-CM | POA: Diagnosis not present

## 2021-08-31 DIAGNOSIS — I10 Essential (primary) hypertension: Secondary | ICD-10-CM

## 2021-08-31 DIAGNOSIS — Z720 Tobacco use: Secondary | ICD-10-CM | POA: Diagnosis not present

## 2021-08-31 MED ORDER — ASPIRIN 325 MG PO TBEC: 650.0000 mg | DELAYED_RELEASE_TABLET | Freq: Three times a day (TID) | ORAL | 0 refills | Status: AC

## 2021-08-31 MED ORDER — DICLOFENAC SODIUM 1 % EX GEL: CUTANEOUS | 3 refills | Status: DC

## 2021-08-31 NOTE — Patient Instructions (Addendum)
Medication Instructions:  Take Aspirin 650 mg three times daily for 7 days. (Hold your baby Aspirin 81 mg during this)   Take Protonix during this period of time.   Restart your Aspirin 81 mg after the 7 days.   *If you need a refill on your cardiac medications before your next appointment, please call your pharmacy*   Lab Work: CRP, ESR today   If you have labs (blood work) drawn today and your tests are completely normal, you will receive your results only by: [place] (if you have MyChart) OR A paper copy in the mail If you have any lab test that is abnormal or we need to change your treatment, we will call you to review the results.   Follow-Up: At [facility], you and your health needs are our priority.  As part of our continuing mission to provide you with exceptional heart care, we have created designated Provider Care Teams.  These Care Teams include your primary Cardiologist (physician) and Advanced Practice Providers (APPs -  Physician Assistants and Nurse Practitioners) who all work together to provide you with the care you need, when you need it.  We recommend signing up for the patient portal called "MyChart".  Sign up information is provided on this After Visit Summary.  MyChart is used to connect with patients for Virtual Visits (Telemedicine).  Patients are able to view lab/test results, encounter notes, upcoming appointments, etc.  Non-urgent messages can be sent to your provider as well.   To learn more about what you can do with MyChart, go to [email or web address].    Your next appointment:   3 month(s)  The format for your next appointment:   In Person  Provider:   Evalina Field, MD

## 2021-09-01 ENCOUNTER — Other Ambulatory Visit: Payer: Self-pay

## 2021-09-01 ENCOUNTER — Ambulatory Visit (HOSPITAL_COMMUNITY): Admission: RE | Admit: 2021-09-01 | Payer: Medicaid Other | Source: Ambulatory Visit

## 2021-09-01 ENCOUNTER — Inpatient Hospital Stay: Payer: Medicaid Other

## 2021-09-01 ENCOUNTER — Encounter: Payer: Self-pay | Admitting: Internal Medicine

## 2021-09-01 ENCOUNTER — Telehealth: Payer: Self-pay | Admitting: Pulmonary Disease

## 2021-09-01 LAB — SEDIMENTATION RATE: Sed Rate: 24 mm/hr (ref 0–40)

## 2021-09-01 LAB — C-REACTIVE PROTEIN: CRP: 6 mg/L (ref 0–10)

## 2021-09-01 MED FILL — Dexamethasone Sodium Phosphate Inj 100 MG/10ML: INTRAMUSCULAR | Qty: 1 | Status: AC

## 2021-09-01 MED FILL — Fosaprepitant Dimeglumine For IV Infusion 150 MG (Base Eq): INTRAVENOUS | Qty: 5 | Status: AC

## 2021-09-01 NOTE — Progress Notes (Signed)
Tay,  I tried to call the patient this morning.  No answer. Pathology consistent with lung cancer. Patient has an appointment to see Dr. Earlie Server and oncology tomorrow.  Thanks,  BLI  Garner Nash, DO [place] [facility] 09/01/2021 11:12 AM

## 2021-09-01 NOTE — Telephone Encounter (Signed)
PCCM:  I tried to reach the patient today to discuss her pathology results.  I will try to give her a call again this afternoon.  She has an appointment to see Dr. Julien Nordmann tomorrow.  Garner Nash, DO [place] 09/01/2021 11:11 AM

## 2021-09-01 NOTE — Progress Notes (Signed)
Called pt to introduce myself as her Arboriculturist.  Pt's ins should pay for her treatment at 100% so copay assistance isn't needed.  I informed her of the [employer], went over what it covers and gave her the income requirement.  She would like to apply so she will bring her proof of income on 09/02/21.  If approved I will give her an expense sheet and my card for any questions or concerns she may have in the future.

## 2021-09-02 ENCOUNTER — Inpatient Hospital Stay: Payer: Medicaid Other

## 2021-09-02 ENCOUNTER — Encounter: Payer: Self-pay | Admitting: Internal Medicine

## 2021-09-02 ENCOUNTER — Inpatient Hospital Stay (HOSPITAL_BASED_OUTPATIENT_CLINIC_OR_DEPARTMENT_OTHER): Payer: Medicaid Other | Admitting: Internal Medicine

## 2021-09-02 ENCOUNTER — Inpatient Hospital Stay: Payer: Medicaid Other | Admitting: Internal Medicine

## 2021-09-02 ENCOUNTER — Other Ambulatory Visit: Payer: Self-pay

## 2021-09-02 VITALS — BP 116/78 | HR 72 | Temp 98.1°F | Resp 18 | Ht 67.25 in | Wt 206.2 lb

## 2021-09-02 VITALS — BP 137/91 | HR 82 | Resp 18

## 2021-09-02 DIAGNOSIS — C3491 Malignant neoplasm of unspecified part of right bronchus or lung: Secondary | ICD-10-CM | POA: Diagnosis not present

## 2021-09-02 DIAGNOSIS — Z5112 Encounter for antineoplastic immunotherapy: Secondary | ICD-10-CM | POA: Diagnosis not present

## 2021-09-02 DIAGNOSIS — Z5111 Encounter for antineoplastic chemotherapy: Secondary | ICD-10-CM | POA: Diagnosis not present

## 2021-09-02 LAB — CBC WITH DIFFERENTIAL (CANCER CENTER ONLY)
Abs Immature Granulocytes: 0.03 10*3/uL (ref 0.00–0.07)
Basophils Absolute: 0 10*3/uL (ref 0.0–0.1)
Basophils Relative: 0 %
Eosinophils Absolute: 0.5 10*3/uL (ref 0.0–0.5)
Eosinophils Relative: 5 %
HCT: 36.1 % (ref 36.0–46.0)
Hemoglobin: 12.5 g/dL (ref 12.0–15.0)
Immature Granulocytes: 0 %
Lymphocytes Relative: 26 %
Lymphs Abs: 2.6 10*3/uL (ref 0.7–4.0)
MCH: 29.7 pg (ref 26.0–34.0)
MCHC: 34.6 g/dL (ref 30.0–36.0)
MCV: 85.7 fL (ref 80.0–100.0)
Monocytes Absolute: 0.6 10*3/uL (ref 0.1–1.0)
Monocytes Relative: 6 %
Neutro Abs: 6.3 10*3/uL (ref 1.7–7.7)
Neutrophils Relative %: 63 %
Platelet Count: 351 10*3/uL (ref 150–400)
RBC: 4.21 MIL/uL (ref 3.87–5.11)
RDW: 13.5 % (ref 11.5–15.5)
WBC Count: 10.1 10*3/uL (ref 4.0–10.5)
nRBC: 0 % (ref 0.0–0.2)

## 2021-09-02 LAB — CMP (CANCER CENTER ONLY)
ALT: 10 U/L (ref 0–44)
AST: 11 U/L — ABNORMAL LOW (ref 15–41)
Albumin: 4.3 g/dL (ref 3.5–5.0)
Alkaline Phosphatase: 91 U/L (ref 38–126)
Anion gap: 4 — ABNORMAL LOW (ref 5–15)
BUN: 10 mg/dL (ref 6–20)
CO2: 28 mmol/L (ref 22–32)
Calcium: 9.3 mg/dL (ref 8.9–10.3)
Chloride: 106 mmol/L (ref 98–111)
Creatinine: 0.67 mg/dL (ref 0.44–1.00)
GFR, Estimated: >60 mL/min (ref >60)
Glucose, Bld: 167 mg/dL — ABNORMAL HIGH (ref 70–99)
Potassium: 3.6 mmol/L (ref 3.5–5.1)
Sodium: 138 mmol/L (ref 135–145)
Total Bilirubin: 0.6 mg/dL (ref 0.3–1.2)
Total Protein: 6.9 g/dL (ref 6.5–8.1)

## 2021-09-02 LAB — TSH: TSH: 3.75 u[IU]/mL (ref 0.350–4.500)

## 2021-09-02 MED ORDER — DIPHENHYDRAMINE HCL 50 MG/ML IJ SOLN
50.0000 mg | Freq: Once | INTRAMUSCULAR | Status: AC | PRN
  Administered 2021-09-02: 0.25 mg via INTRAVENOUS

## 2021-09-02 MED ORDER — SODIUM CHLORIDE 0.9 % IV SOLN
10.0000 mg | Freq: Once | INTRAVENOUS | Status: AC
  Administered 2021-09-02: 10 mg via INTRAVENOUS
  Filled 2021-09-02: qty 10

## 2021-09-02 MED ORDER — SODIUM CHLORIDE 0.9 % IV SOLN
150.0000 mg | Freq: Once | INTRAVENOUS | Status: AC
  Administered 2021-09-02: 150 mg via INTRAVENOUS
  Filled 2021-09-02: qty 150

## 2021-09-02 MED ORDER — SODIUM CHLORIDE 0.9 % IV SOLN
697.5000 mg | Freq: Once | INTRAVENOUS | Status: AC
  Administered 2021-09-02: 700 mg via INTRAVENOUS
  Filled 2021-09-02: qty 70

## 2021-09-02 MED ORDER — SODIUM CHLORIDE 0.9 % IV SOLN
500.0000 mg/m2 | Freq: Once | INTRAVENOUS | Status: AC
  Administered 2021-09-02: 1100 mg via INTRAVENOUS
  Filled 2021-09-02: qty 40

## 2021-09-02 MED ORDER — SODIUM CHLORIDE 0.9 % IV SOLN
200.0000 mg | Freq: Once | INTRAVENOUS | Status: AC
  Administered 2021-09-02: 200 mg via INTRAVENOUS
  Filled 2021-09-02: qty 200

## 2021-09-02 MED ORDER — SODIUM CHLORIDE 0.9 % IV SOLN: Freq: Once | INTRAVENOUS | Status: AC

## 2021-09-02 MED ORDER — PALONOSETRON HCL INJECTION 0.25 MG/5ML
0.2500 mg | Freq: Once | INTRAVENOUS | Status: AC
  Administered 2021-09-02: 0.25 mg via INTRAVENOUS
  Filled 2021-09-02: qty 5

## 2021-09-02 NOTE — Patient Instructions (Addendum)
[facility]  Discharge Instructions: Thank you for choosing [facility] to provide your oncology and hematology care.   If you have a lab appointment with the [facility], please go directly to the [facility] and check in at the registration area.   Wear comfortable clothing and clothing appropriate for easy access to any Portacath or PICC line.   We strive to give you quality time with your provider. You may need to reschedule your appointment if you arrive late (15 or more minutes).  Arriving late affects you and other patients whose appointments are after yours.  Also, if you miss three or more appointments without notifying the office, you may be dismissed from the clinic at the provider's discretion.      For prescription refill requests, have your pharmacy contact our office and allow 72 hours for refills to be completed.    Today you received the following chemotherapy and/or immunotherapy agents: Keytruda, Alimta, Carboplatin.       To help prevent nausea and vomiting after your treatment, we encourage you to take your nausea medication as directed.  BELOW ARE SYMPTOMS THAT SHOULD BE REPORTED IMMEDIATELY: *FEVER GREATER THAN 100.4 F (38 C) OR HIGHER *CHILLS OR SWEATING *NAUSEA AND VOMITING THAT IS NOT CONTROLLED WITH YOUR NAUSEA MEDICATION *UNUSUAL SHORTNESS OF BREATH *UNUSUAL BRUISING OR BLEEDING *URINARY PROBLEMS (pain or burning when urinating, or frequent urination) *BOWEL PROBLEMS (unusual diarrhea, constipation, pain near the anus) TENDERNESS IN MOUTH AND THROAT WITH OR WITHOUT PRESENCE OF ULCERS (sore throat, sores in mouth, or a toothache) UNUSUAL RASH, SWELLING OR PAIN  UNUSUAL VAGINAL DISCHARGE OR ITCHING   Items with * indicate a potential emergency and should be followed up as soon as possible or go to the Emergency Department if any problems should occur.  Please show the CHEMOTHERAPY ALERT CARD or IMMUNOTHERAPY  ALERT CARD at check-in to the Emergency Department and triage nurse.  Should you have questions after your visit or need to cancel or reschedule your appointment, please contact [facility]  Dept: [phone]  and follow the prompts.  Office hours are 8:00 a.m. to 4:30 p.m. Monday - Friday. Please note that voicemails left after 4:00 p.m. may not be returned until the following business day.  We are closed weekends and major holidays. You have access to a nurse at all times for urgent questions. Please call the main number to the clinic Dept: [phone] and follow the prompts.   For any non-urgent questions, you may also contact your provider using MyChart. We now offer e-Visits for anyone 45 and older to request care online for non-urgent symptoms. For details visit [email or web address].   Also download the MyChart app! Go to the app store, search "MyChart", open the app, select [facility], and log in with your MyChart username and password.  Masks are optional in the cancer centers. If you would like for your care team to wear a mask while they are taking care of you, please let them know. You may have one support person who is at least 58 years old accompany you for your appointments. Pembrolizumab Injection What is this medication? PEMBROLIZUMAB (PEM broe LIZ ue mab) treats some types of cancer. It works by helping your immune system slow or stop the spread of cancer cells. It is a monoclonal antibody. This medicine may be used for other purposes; ask your health care provider or pharmacist if you have questions. COMMON BRAND NAME(S):  Keytruda What should I tell my care team before I take this medication? They need to know if you have any of these conditions: Allogeneic stem cell transplant (uses someone else's stem cells) Autoimmune diseases, such as Crohn disease, ulcerative colitis, lupus History of chest radiation Nervous system problems, such as  Guillain-Barre syndrome, myasthenia gravis Organ transplant An unusual or allergic reaction to pembrolizumab, other medications, foods, dyes, or preservatives Pregnant or trying to get pregnant Breast-feeding How should I use this medication? This medication is injected into a vein. It is given by your care team in a hospital or clinic setting. A special MedGuide will be given to you before each treatment. Be sure to read this information carefully each time. Talk to your care team about the use of this medication in children. While it may be prescribed for children as young as 6 months for selected conditions, precautions do apply. Overdosage: If you think you have taken too much of this medicine contact a poison control center or emergency room at once. NOTE: This medicine is only for you. Do not share this medicine with others. What if I miss a dose? Keep appointments for follow-up doses. It is important not to miss your dose. Call your care team if you are unable to keep an appointment. What may interact with this medication? Interactions have not been studied. This list may not describe all possible interactions. Give your health care provider a list of all the medicines, herbs, non-prescription drugs, or dietary supplements you use. Also tell them if you smoke, drink alcohol, or use illegal drugs. Some items may interact with your medicine. What should I watch for while using this medication? Your condition will be monitored carefully while you are receiving this medication. You may need blood work while taking this medication. This medication may cause serious skin reactions. They can happen weeks to months after starting the medication. Contact your care team right away if you notice fevers or flu-like symptoms with a rash. The rash may be red or purple and then turn into blisters or peeling of the skin. You may also notice a red rash with swelling of the face, lips, or lymph nodes in your  neck or under your arms. Tell your care team right away if you have any change in your eyesight. Talk to your care team if you may be pregnant. Serious birth defects can occur if you take this medication during pregnancy and for 4 months after the last dose. You will need a negative pregnancy test before starting this medication. Contraception is recommended while taking this medication and for 4 months after the last dose. Your care team can help you find the option that works for you. Do not breastfeed while taking this medication and for 4 months after the last dose. What side effects may I notice from receiving this medication? Side effects that you should report to your care team as soon as possible: Allergic reactions--skin rash, itching, hives, swelling of the face, lips, tongue, or throat Dry cough, shortness of breath or trouble breathing Eye pain, redness, irritation, or discharge with blurry or decreased vision Heart muscle inflammation--unusual weakness or fatigue, shortness of breath, chest pain, fast or irregular heartbeat, dizziness, swelling of the ankles, feet, or hands Hormone gland problems--headache, sensitivity to light, unusual weakness or fatigue, dizziness, fast or irregular heartbeat, increased sensitivity to cold or heat, excessive sweating, constipation, hair loss, increased thirst or amount of urine, tremors or shaking, irritability Infusion reactions--chest pain, shortness  of breath or trouble breathing, feeling faint or lightheaded Kidney injury (glomerulonephritis)--decrease in the amount of urine, red or dark brown urine, foamy or bubbly urine, swelling of the ankles, hands, or feet Liver injury--right upper belly pain, loss of appetite, nausea, light-colored stool, dark yellow or brown urine, yellowing skin or eyes, unusual weakness or fatigue Pain, tingling, or numbness in the hands or feet, muscle weakness, change in vision, confusion or trouble speaking, loss of  balance or coordination, trouble walking, seizures Rash, fever, and swollen lymph nodes Redness, blistering, peeling, or loosening of the skin, including inside the mouth Sudden or severe stomach pain, bloody diarrhea, fever, nausea, vomiting Side effects that usually do not require medical attention (report to your care team if they continue or are bothersome): Bone, joint, or muscle pain Diarrhea Fatigue Loss of appetite Nausea Skin rash This list may not describe all possible side effects. Call your doctor for medical advice about side effects. You may report side effects to FDA at 1-800-FDA-1088. Where should I keep my medication? This medication is given in a hospital or clinic. It will not be stored at home. NOTE: This sheet is a summary. It may not cover all possible information. If you have questions about this medicine, talk to your doctor, pharmacist, or health care provider.  2023 Elsevier/Gold Standard (2021-04-20 00:00:00) Pemetrexed Injection What is this medication? PEMETREXED (PEM e TREX ed) treats some types of cancer. It works by slowing down the growth of cancer cells. This medicine may be used for other purposes; ask your health care provider or pharmacist if you have questions. COMMON BRAND NAME(S): Alimta, PEMFEXY What should I tell my care team before I take this medication? They need to know if you have any of these conditions: Infection, such as chickenpox, cold sores, or herpes Kidney disease Low blood cell levels (white cells, red cells, and platelets) Lung or breathing disease, such as asthma Radiation therapy An unusual or allergic reaction to pemetrexed, other medications, foods, dyes, or preservatives If you or your partner are pregnant or trying to get pregnant Breast-feeding How should I use this medication? This medication is injected into a vein. It is given by your care team in a hospital or clinic setting. Talk to your care team about the use of  this medication in children. Special care may be needed. Overdosage: If you think you have taken too much of this medicine contact a poison control center or emergency room at once. NOTE: This medicine is only for you. Do not share this medicine with others. What if I miss a dose? Keep appointments for follow-up doses. It is important not to miss your dose. Call your care team if you are unable to keep an appointment. What may interact with this medication? Do not take this medication with any of the following: Live virus vaccines This medication may also interact with the following: Ibuprofen This list may not describe all possible interactions. Give your health care provider a list of all the medicines, herbs, non-prescription drugs, or dietary supplements you use. Also tell them if you smoke, drink alcohol, or use illegal drugs. Some items may interact with your medicine. What should I watch for while using this medication? Your condition will be monitored carefully while you are receiving this medication. This medication may make you feel generally unwell. This is not uncommon as chemotherapy can affect healthy cells as well as cancer cells. Report any side effects. Continue your course of treatment even though you feel  ill unless your care team tells you to stop. This medication can cause serious side effects. To reduce the risk, your care team may give you other medications to take before receiving this one. Be sure to follow the directions from your care team. This medication can cause a rash or redness in areas of the body that have previously had radiation therapy. If you have had radiation therapy, tell your care team if you notice a rash in this area. This medication may increase your risk of getting an infection. Call your care team for advice if you get a fever, chills, sore throat, or other symptoms of a cold or flu. Do not treat yourself. Try to avoid being around people who are  sick. Be careful brushing or flossing your teeth or using a toothpick because you may get an infection or bleed more easily. If you have any dental work done, tell your dentist you are receiving this medication. Avoid taking medications that contain aspirin, acetaminophen, ibuprofen, naproxen, or ketoprofen unless instructed by your care team. These medications may hide a fever. Check with your care team if you have severe diarrhea, nausea, and vomiting, or if you sweat a lot. The loss of too much body fluid may make it dangerous for you to take this medication. Talk to your care team if you or your partner wish to become pregnant or think either of you might be pregnant. This medication can cause serious birth defects if taken during pregnancy and for 6 months after the last dose. A negative pregnancy test is required before starting this medication. A reliable form of contraception is recommended while taking this medication and for 6 months after the last dose. Talk to your care team about reliable forms of contraception. Do not father a child while taking this medication and for 3 months after the last dose. Use a condom while having sex during this time period. Do not breastfeed while taking this medication and for 1 week after the last dose. This medication may cause infertility. Talk to your care team if you are concerned about your fertility. What side effects may I notice from receiving this medication? Side effects that you should report to your care team as soon as possible: Allergic reactions--skin rash, itching, hives, swelling of the face, lips, tongue, or throat Dry cough, shortness of breath or trouble breathing Infection--fever, chills, cough, sore throat, wounds that don't heal, pain or trouble when passing urine, general feeling of discomfort or being unwell Kidney injury--decrease in the amount of urine, swelling of the ankles, hands, or feet Low red blood cell level--unusual  weakness or fatigue, dizziness, headache, trouble breathing Redness, blistering, peeling, or loosening of the skin, including inside the mouth Unusual bruising or bleeding Side effects that usually do not require medical attention (report to your care team if they continue or are bothersome): Fatigue Loss of appetite Nausea Vomiting This list may not describe all possible side effects. Call your doctor for medical advice about side effects. You may report side effects to FDA at 1-800-FDA-1088. Where should I keep my medication? This medication is given in a hospital or clinic. It will not be stored at home. NOTE: This sheet is a summary. It may not cover all possible information. If you have questions about this medicine, talk to your doctor, pharmacist, or health care provider.  2023 Elsevier/Gold Standard (2021-05-04 00:00:00) Carboplatin Injection What is this medication? CARBOPLATIN (KAR boe pla tin) treats some types of cancer. It works by  slowing down the growth of cancer cells. This medicine may be used for other purposes; ask your health care provider or pharmacist if you have questions. COMMON BRAND NAME(S): Paraplatin What should I tell my care team before I take this medication? They need to know if you have any of these conditions: Blood disorders Hearing problems Kidney disease Recent or ongoing radiation therapy An unusual or allergic reaction to carboplatin, cisplatin, other medications, foods, dyes, or preservatives Pregnant or trying to get pregnant Breast-feeding How should I use this medication? This medication is injected into a vein. It is given by your care team in a hospital or clinic setting. Talk to your care team about the use of this medication in children. Special care may be needed. Overdosage: If you think you have taken too much of this medicine contact a poison control center or emergency room at once. NOTE: This medicine is only for you. Do not share  this medicine with others. What if I miss a dose? Keep appointments for follow-up doses. It is important not to miss your dose. Call your care team if you are unable to keep an appointment. What may interact with this medication? Medications for seizures Some antibiotics, such as amikacin, gentamicin, neomycin, streptomycin, tobramycin Vaccines This list may not describe all possible interactions. Give your health care provider a list of all the medicines, herbs, non-prescription drugs, or dietary supplements you use. Also tell them if you smoke, drink alcohol, or use illegal drugs. Some items may interact with your medicine. What should I watch for while using this medication? Your condition will be monitored carefully while you are receiving this medication. You may need blood work while taking this medication. This medication may make you feel generally unwell. This is not uncommon, as chemotherapy can affect healthy cells as well as cancer cells. Report any side effects. Continue your course of treatment even though you feel ill unless your care team tells you to stop. In some cases, you may be given additional medications to help with side effects. Follow all directions for their use. This medication may increase your risk of getting an infection. Call your care team for advice if you get a fever, chills, sore throat, or other symptoms of a cold or flu. Do not treat yourself. Try to avoid being around people who are sick. Avoid taking medications that contain aspirin, acetaminophen, ibuprofen, naproxen, or ketoprofen unless instructed by your care team. These medications may hide a fever. Be careful brushing or flossing your teeth or using a toothpick because you may get an infection or bleed more easily. If you have any dental work done, tell your dentist you are receiving this medication. Talk to your care team if you wish to become pregnant or think you might be pregnant. This medication can  cause serious birth defects. Talk to your care team about effective forms of contraception. Do not breast-feed while taking this medication. What side effects may I notice from receiving this medication? Side effects that you should report to your care team as soon as possible: Allergic reactions--skin rash, itching, hives, swelling of the face, lips, tongue, or throat Infection--fever, chills, cough, sore throat, wounds that don't heal, pain or trouble when passing urine, general feeling of discomfort or being unwell Low red blood cell level--unusual weakness or fatigue, dizziness, headache, trouble breathing Pain, tingling, or numbness in the hands or feet, muscle weakness, change in vision, confusion or trouble speaking, loss of balance or coordination, trouble walking, seizures  Unusual bruising or bleeding Side effects that usually do not require medical attention (report to your care team if they continue or are bothersome): Hair loss Nausea Unusual weakness or fatigue Vomiting This list may not describe all possible side effects. Call your doctor for medical advice about side effects. You may report side effects to FDA at 1-800-FDA-1088. Where should I keep my medication? This medication is given in a hospital or clinic. It will not be stored at home. NOTE: This sheet is a summary. It may not cover all possible information. If you have questions about this medicine, talk to your doctor, pharmacist, or health care provider.  2023 Elsevier/Gold Standard (2021-04-21 00:00:00)

## 2021-09-02 NOTE — Progress Notes (Signed)
Pt is approved for the $1000 [employer] grant.  

## 2021-09-02 NOTE — Progress Notes (Signed)
Cassidy [name]  OFFICE PROGRESS NOTE  Cassidy Settler, Cassidy [name] [place] [address]  DIAGNOSIS: stage IV (T2a, N2, M1c) non-small cell lung cancer, adenocarcinoma presented with right upper lobe lung mass in addition to right hilar and mediastinal lymphadenopathy and innumerable bilateral pulmonary nodules as well as liver and bone metastasis diagnosed in August 2023.   Detected Alteration(s) / Biomarker(s) Associated FDA-approved therapies Clinical Trial Availability % cfDNA or Amplification  KRAS G12C  approved by FDA Adagrasib, Sotorasib Yes 5.3%  IDH1 R132L  approved in other indication Ivosidenib, Olutasidenib Yes 1.9%  PD-L1 expression 89%  PRIOR THERAPY: None  CURRENT THERAPY: Systemic chemotherapy with carboplatin for AUC of 5, Alimta 500 Mg/M2 and Keytruda 200 Mg IV every 3 weeks.  First dose September 02, 2021.  INTERVAL HISTORY: Cassidy [name] 56 y.o. female returns to the clinic today for follow-up visit.  The patient is feeling fine today with no concerning complaints except for mild fatigue.  She had a PET scan performed recently as well as molecular studies by [employer] that showed positive KRAS G12C mutation and PD-L1 expression was 89%.  The patient did not have MRI of the brain because of insurance coverage issues and preauthorization.  She denied having any current chest pain but has shortness of breath with exertion with mild cough and no hemoptysis.  She has no nausea, vomiting, diarrhea or constipation.  She has no headache or visual changes.  She is here today for evaluation before starting the first dose of her treatment.  MEDICAL HISTORY: Past Medical History:  Diagnosis Date   Adenocarcinoma of right lung, stage 4 ([place]) 08/26/2021   Anemia    has sickle cell trait   Atherosclerotic heart disease of native coronary artery with angina pectoris ([place]) 05/2012, 10/2013   a.  s/p PCTA to  dRCA and ostial RPAV-PLA vessel + PDA branch (05/2012)  b. [place] s/p DES to mLAD - Resolute DES 3.0 x 22 (3.107m -->3.3 mm)   Bipolar depression ([place]    Breast abscess    a. right side.    COPD (chronic obstructive pulmonary disease) (HCC)    Depression    Diabetic peripheral neuropathy (HCC)    Fibromyalgia    Genital warts    GERD (gastroesophageal reflux disease)    History of hiatal hernia    HLD (hyperlipidemia)    Hypertension    Pain in limb    a. LE VENOUS DUPLEX, 02/05/2009 - no evidence of deep vein thrombosis, Baker's cyst   Pneumonia    PTSD (post-traumatic stress disorder)    Sickle cell trait (HCC)    Tobacco abuse    Tooth caries    Type II diabetes mellitus (HCC)     ALLERGIES:  is allergic to amoxil [amoxicillin], chantix [varenicline], and suboxone [buprenorphine hcl-naloxone hcl].  MEDICATIONS:  Current Outpatient Medications  Medication Sig Dispense Refill   ACCU-CHEK AVIVA PLUS test strip TEST BLOOD SUGAR THREE TIMES DAILY AS DIRECTED 50 each 1   amLODipine (NORVASC) 5 MG tablet Take 1 tablet (5 mg total) by mouth at bedtime. 90 tablet 3   aspirin EC 325 MG tablet Take 2 tablets (650 mg total) by mouth in the morning, at noon, and at bedtime for 7 days. 42 tablet 0   aspirin EC 81 MG tablet Take 81 mg by mouth daily.     atorvastatin (LIPITOR) 40 MG tablet TAKE 1 TABLET (40  MG TOTAL) BY MOUTH DAILY. 90 tablet 3   busPIRone (BUSPAR) 15 MG tablet TAKE 1/2 TABLET (7.5 MG TOTAL) BY MOUTH TWICE DAILY (Patient taking differently: Take 7.5 mg by mouth 2 (two) times daily.) 30 tablet 11   Carboxymethylcellul-Glycerin (LUBRICATING EYE DROPS OP) Place 1 drop into both eyes daily as needed (dry eyes).     carvedilol (COREG) 12.5 MG tablet Take 1 tablet (12.5 mg total) by mouth 2 (two) times daily with a meal. 180 tablet 1   diclofenac Sodium (VOLTAREN) 1 % GEL Apply 1 application topically 3 times daily as needed for pain 2 g 3   diphenhydramine-acetaminophen (TYLENOL PM)  25-500 MG TABS tablet Take 1 tablet by mouth at bedtime as needed (sleep).     Dulaglutide (TRULICITY) 4.49 QP/5.9FM SOPN Inject 0.75 mg into the skin once a week. (Patient taking differently: Inject 0.75 mg into the skin every Monday.) 2 mL 0   ferrous sulfate 325 (65 FE) MG tablet Take 325 mg by mouth daily with breakfast.     fluticasone (FLONASE) 50 MCG/ACT nasal spray PLACE 1 SPRAY INTO BOTH NOSTRILS DAILY AS NEEDED (CONGESTION). 16 g 1   fluticasone-salmeterol (ADVAIR) 100-50 MCG/ACT AEPB Inhale 1 puff into the lungs 2 (two) times daily. 1 each 3   folic acid (FOLVITE) 1 MG tablet Take 1 tablet (1 mg total) by mouth daily. 30 tablet 3   furosemide (LASIX) 40 MG tablet Take 1 tablet (40 mg total) by mouth daily as needed. If you gain 3 lbs in 24 hours or 5 lbs in 1 week 30 tablet 1   ipratropium (ATROVENT HFA) 17 MCG/ACT inhaler Inhale 2 puffs into the lungs every 4 (four) hours as needed for wheezing (COPD).     isosorbide mononitrate (IMDUR) 30 MG 24 hr tablet Take 30 mg by mouth daily. (Patient not taking: Reported on 08/31/2021)     LINZESS 290 MCG CAPS capsule TAKE 1 CAPSULE (290 MCG TOTAL) BY MOUTH DAILY BEFORE BREAKFAST. (Patient taking differently: Take 290 mcg by mouth every Monday.) 30 capsule 3   losartan (COZAAR) 50 MG tablet Take 50 mg by mouth daily.     methocarbamol (ROBAXIN) 500 MG tablet Take 1 tablet (500 mg total) by mouth every 8 (eight) hours as needed for muscle spasms. 20 tablet 0   nicotine polacrilex (NICORETTE) 2 MG gum Take 1 each (2 mg total) by mouth as needed for smoking cessation. 100 tablet 0   nitroGLYCERIN (NITROSTAT) 0.4 MG SL tablet Place 1 tablet (0.4 mg total) under the tongue every 5 (five) minutes as needed for chest pain. 25 tablet 5   oxyCODONE-acetaminophen (PERCOCET) 5-325 MG tablet Take 1-2 tablets by mouth every 6 (six) hours as needed for severe pain. 45 tablet 0   pantoprazole (PROTONIX) 40 MG tablet Take 1 tablet (40 mg total) by mouth daily. 30  tablet 1   potassium chloride SA (KLOR-CON M) 20 MEQ tablet Take 1 tablet (20 mEq total) by mouth daily as needed. On days you take Lasix 30 tablet 1   prochlorperazine (COMPAZINE) 10 MG tablet Take 1 tablet (10 mg total) by mouth every 6 (six) hours as needed for nausea or vomiting. 30 tablet 0   sertraline (ZOLOFT) 50 MG tablet TAKE 1 TABLET (50 MG TOTAL) BY MOUTH DAILY. 30 tablet 3   valACYclovir (VALTREX) 1000 MG tablet TAKE FOR 3 DAYS AS NEEDED FOR EACH OUTBREAK 30 tablet 11   vitamin B-12 (CYANOCOBALAMIN) 1000 MCG tablet Take 1,000 mcg by  mouth daily.     No current facility-administered medications for this visit.    SURGICAL HISTORY:  Past Surgical History:  Procedure Laterality Date   BLADDER SURGERY  1980   "TVT"   BLADDER SURGERY  2003   "TVT"   BREAST EXCISIONAL BIOPSY     BREAST SURGERY Right    I&D for multiple abscesses   CARDIAC CATHETERIZATION     CORONARY ARTERY BYPASS GRAFT N/A 06/30/2021   Procedure: CORONARY ARTERY BYPASS GRAFTING (CABG) x3 USING LEFT INTERNAL MAMMARY ARTERY,  RIGHT RADIAL ARTERY AND LEFT GREATER SAPHENOUS VEIN;  Surgeon: Lajuana Matte, MD;  Location: [facility];  Service: Open Heart Surgery;  Laterality: N/A;   cutting balloon     ENDOVEIN HARVEST OF GREATER SAPHENOUS VEIN Left 06/30/2021   Procedure: ENDOVEIN HARVEST OF GREATER SAPHENOUS VEIN;  Surgeon: Lajuana Matte, MD;  Location: [facility];  Service: Open Heart Surgery;  Laterality: Left;   EYE SURGERY Left    laser surgery   FINE NEEDLE ASPIRATION  08/21/2021   Procedure: FINE NEEDLE ASPIRATION (FNA) LINEAR;  Surgeon: Garner Nash, Cassidy [name];  Location: [facility];  Service: Pulmonary;;   INCISE AND DRAIN ABCESS  ; 04/26/11; 08/13/11   right breast   INCISION AND DRAINAGE ABSCESS Right 05/09/2012   Procedure: INCISION AND DRAINAGE RIGHT BREAST ABSCESS;  Surgeon: Imogene Burn. Georgette Dover, MD;  Location: [facility];  Service: General;  Laterality: Right;   INCISION AND DRAINAGE ABSCESS Right 02/08/2014    Procedure: INCISION AND DRAINAGE RIGHT BREAST ABSCESS;  Surgeon: Donnie Mesa, MD;  Location: [facility];  Service: General;  Laterality: Right;   INCISION AND DRAINAGE ABSCESS Right 06/14/2014   Procedure: INCISION AND DRAINAGE RIGHT BREAST ABSCESS;  Surgeon: Donnie Mesa, MD;  Location: [facility];  Service: General;  Laterality: Right;   IRRIGATION AND DEBRIDEMENT ABSCESS  12/19/2010   Procedure: IRRIGATION AND DEBRIDEMENT ABSCESS;  Surgeon: Rolm Bookbinder, MD;  Location: [facility];  Service: General;  Laterality: Right;   IRRIGATION AND DEBRIDEMENT ABSCESS  08/13/2011   Procedure: MINOR INCISION AND DRAINAGE OF ABSCESS;  Surgeon: Gwenyth Ober, MD;  Location: [facility];  Service: General;  Laterality: Right;  Right Breast    IRRIGATION AND DEBRIDEMENT ABSCESS  11/17/2011   Procedure: IRRIGATION AND DEBRIDEMENT ABSCESS;  Surgeon: Imogene Burn. Georgette Dover, MD;  Location: [facility];  Service: General;  Laterality: Right;  irrigation and debridement right recurrent breast abscess   LARYNX SURGERY     LEFT HEART CATH AND CORONARY ANGIOGRAPHY N/A 04/09/2021   Procedure: LEFT HEART CATH AND CORONARY ANGIOGRAPHY;  Surgeon: Jettie Booze, MD;  Location: [facility];  Service: Cardiovascular;  Laterality: N/A;   LEFT HEART CATHETERIZATION WITH CORONARY ANGIOGRAM N/A 05/29/2012   Procedure: LEFT HEART CATHETERIZATION WITH CORONARY ANGIOGRAM & PTCA;  Surgeon: Troy Sine, MD;  Location[facility];  Service: Cardiovascular; Bifurcation dRCA-RPAD/PLA & rPDA  PTCA.   LEFT HEART CATHETERIZATION WITH CORONARY ANGIOGRAM N/A 11/08/2013   Procedure: LEFT HEART CATHETERIZATION WITH CORONARY ANGIOGRAM and Coronary Stent Intervention;  Surgeon: Leonie Man, MD;  Location: [facility] C[facility] LAB;  Service: Cardiovascular; mLAD 80% (FFR 0.73) - resolute DES 3.0 x 22 mm (postdilated to 3.3 mm->3.5 mm)   NM MYOVIEW LTD  01/26/2009   Normal study, no evidence of ischemia, EF 67%   ployp removed from voice box 03/30/12     RADIAL  ARTERY HARVEST Right 06/30/2021   Procedure: RADIAL ARTERY HARVEST;  Surgeon: Lajuana Matte,  MD;  Location: [facility];  Service: Open Heart Surgery;  Laterality: Right;   REFRACTIVE SURGERY  ~ 2010   right   TEE WITHOUT CARDIOVERSION N/A 06/30/2021   Procedure: TRANSESOPHAGEAL ECHOCARDIOGRAM (TEE);  Surgeon: Lajuana Matte, MD;  Location: [facility];  Service: Open Heart Surgery;  Laterality: N/A;   TONSILLECTOMY  1988   VIDEO BRONCHOSCOPY WITH ENDOBRONCHIAL ULTRASOUND Bilateral 08/21/2021   Procedure: VIDEO BRONCHOSCOPY WITH ENDOBRONCHIAL ULTRASOUND;  Surgeon: Garner Nash, Cassidy [name];  Location: [facility];  Service: Pulmonary;  Laterality: Bilateral;    REVIEW OF SYSTEMS:  Constitutional: positive for fatigue Eyes: negative Ears, nose, mouth, throat, and face: negative Respiratory: positive for dyspnea on exertion Cardiovascular: negative Gastrointestinal: negative Genitourinary:negative Integument/breast: negative Hematologic/lymphatic: negative Musculoskeletal:negative Neurological: negative Behavioral/Psych: negative Endocrine: negative Allergic/Immunologic: negative   PHYSICAL EXAMINATION: General appearance: alert, cooperative, fatigued, and no distress Head: Normocephalic, without obvious abnormality, atraumatic Neck: no adenopathy, no JVD, supple, symmetrical, trachea midline, and thyroid not enlarged, symmetric, no tenderness/mass/nodules Lymph nodes: Cervical, supraclavicular, and axillary nodes normal. Resp: clear to auscultation bilaterally Back: symmetric, no curvature. ROM normal. No CVA tenderness. Cardio: regular rate and rhythm, S1, S2 normal, no murmur, click, rub or gallop GI: soft, non-tender; bowel sounds normal; no masses,  no organomegaly Extremities: extremities normal, atraumatic, no cyanosis or edema Neurologic: Alert and oriented X 3, normal strength and tone. Normal symmetric reflexes. Normal coordination and gait  ECOG PERFORMANCE STATUS: 1 -  Symptomatic but completely ambulatory  Blood pressure 116/78, pulse 72, temperature 98.1 F (36.7 C), temperature source Oral, resp. rate 18, height 5' 7.25" (1.708 m), weight 206 lb 3.2 oz (93.5 kg), last menstrual period 06/12/2016, SpO2 100 %.  LABORATORY DATA: Lab Results  Component Value Date   WBC 10.1 09/02/2021   HGB 12.5 09/02/2021   HCT 36.1 09/02/2021   MCV 85.7 09/02/2021   PLT 351 09/02/2021      Chemistry      Component Value Date/Time   NA 138 09/02/2021 1007   NA 140 04/03/2021 1059   K 3.6 09/02/2021 1007   CL 106 09/02/2021 1007   CO2 28 09/02/2021 1007   BUN 10 09/02/2021 1007   BUN 13 04/03/2021 1059   CREATININE 0.67 09/02/2021 1007   CREATININE 0.65 11/06/2013 1520      Component Value Date/Time   CALCIUM 9.3 09/02/2021 1007   ALKPHOS 91 09/02/2021 1007   AST 11 (L) 09/02/2021 1007   ALT 10 09/02/2021 1007   BILITOT 0.6 09/02/2021 1007       RADIOGRAPHIC STUDIES: NM PET Image Initial (PI) Skull Base To Thigh (F-18 FDG)  Result Date: 08/31/2021 CLINICAL DATA:  Initial treatment strategy for non-small cell right lung cancer. Adenocarcinoma by 08/21/2021 FNA. EXAM: NUCLEAR MEDICINE PET SKULL BASE TO THIGH TECHNIQUE: 10.1 mCi F-18 FDG was injected intravenously. Full-ring PET imaging was performed from the skull base to thigh after the radiotracer. CT data was obtained and used for attenuation correction and anatomic localization. Fasting blood glucose: 149 mg/dl COMPARISON:  08/18/2021 chest CT angiogram. 05/22/2015 CT abdomen/pelvis. FINDINGS: Mediastinal blood pool activity: SUV max 2.9 Liver activity: SUV max NA NECK: Enlarged hypermetabolic bilateral supraclavicular neck lymph nodes, largest 1.3 cm on the left with max SUV 7.5 (series 4/image 44). Incidental CT findings: None. CHEST: Hypermetabolic solid 3.4 x 2.3 cm right upper lobe lung mass with max SUV 9.7 (series 7/image 18). Innumerable (greater than 20) solid pulmonary nodules scattered  throughout both lungs involving all lung lobes, several of  which demonstrate hypermetabolism, for example measuring 1.2 cm in the right upper lobe with max SUV 4.6 (series 7/image 25) and measuring 1.2 cm in the posterior left upper lobe with max SUV 3.2 (series 7/image 33). Bulky hypermetabolic right hilar lymphadenopathy measuring 3.1 cm short axis diameter with max SUV 9.4 (series 4/image 67). Enlarged hypermetabolic 1.5 cm subcarinal node with max SUV 8.9 (series 4/image 69). Enlarged hypermetabolic 1.3 cm right paratracheal node with max SUV 9.2 (series 4/image 63). Hypermetabolic high left mediastinal prevascular 0.8 cm node with max SUV 5.0 (series 4/image 48). No hypermetabolic left hilar nodes. Incidental CT findings: Coronary atherosclerosis status post CABG. Atherosclerotic nonaneurysmal thoracic aorta. Incompletely healed median sternotomy with intact median sternotomy wires. Hypermetabolism along the median sternotomy site without discrete osseous lesions. ABDOMEN/PELVIS: Hypermetabolic 1.5 cm far inferior right liver mass with max SUV 6.6 (series 4/image 128). Hypermetabolic 1.9 cm segment 3 left liver mass with max SUV 9.3 (series 4/image 104). No abnormal hypermetabolic activity within the pancreas, adrenal glands, or spleen. No hypermetabolic lymph nodes in the abdomen or pelvis. Incidental CT findings: Right adrenal 2.6 cm nodule with density 22 HU, stable since 05/22/2015 CT abdomen, compatible with a benign adenoma. Atherosclerotic nonaneurysmal abdominal aorta. SKELETON: Several scattered hypermetabolic lytic skeletal lesions to the spine, ribs, scapula, pelvis and right femur. Representative lesions as follows: -proximal right femoral metaphysis lesion with max SUV 6.5 -midline upper sacral lesion with max SUV 10.1 -right L4 vertebral lesion with max SUV 8.8 -lateral left sixth rib lesion with max SUV 7.7 Incidental CT findings: None. IMPRESSION: 1. Hypermetabolic solid 3.4 cm right upper lobe  lung mass compatible with primary bronchogenic malignancy. 2. Hypermetabolic metastatic adenopathy to the ipsilateral hilum, bilateral mediastinum and bilateral supraclavicular neck. 3. Two hypermetabolic liver metastases. 4. Several scattered hypermetabolic lytic skeletal metastases to the axial and proximal appendicular skeleton. 5. Chronic findings include: Aortic Atherosclerosis (ICD10-I70.0). Right adrenal nodule stable since 2017 CT, favoring benign adenoma. Electronically Signed   By: Ilona Sorrel M.D.   On: 08/31/2021 16:24   CT Angio Chest PE W and/or Wo Contrast  Result Date: 08/18/2021 CLINICAL DATA:  Left-sided chest pain. EXAM: CT ANGIOGRAPHY CHEST WITH CONTRAST TECHNIQUE: Multidetector CT imaging of the chest was performed using the standard protocol during bolus administration of intravenous contrast. Multiplanar CT image reconstructions and MIPs were obtained to evaluate the vascular anatomy. RADIATION DOSE REDUCTION: This exam was performed according to the departmental dose-optimization program which includes automated exposure control, adjustment of the mA and/or kV according to patient size and/or use of iterative reconstruction technique. CONTRAST:  48m OMNIPAQUE IOHEXOL 350 MG/ML SOLN COMPARISON:  May 26, 2012 and May 22, 2015 FINDINGS: Cardiovascular: There is mild calcification of the thoracic aorta, without evidence of aortic aneurysm. Satisfactory opacification of the pulmonary arteries to the segmental level. No evidence of pulmonary embolism. Normal heart size with marked severity coronary artery calcification. A very small, predominantly anterior pericardial effusion is noted. Mediastinum/Nodes: There is moderate to marked severity subcarinal and right hilar lymphadenopathy. Thyroid gland, trachea, and esophagus demonstrate no significant findings. Lungs/Pleura: A 3.2 cm x 2.0 cm x 2.2 cm heterogeneous soft lung mass is seen within the right upper lobe. Innumerable bilateral  noncalcified lung nodules of various sizes are also noted. There is no evidence of acute infiltrate, pleural effusion or pneumothorax. Upper Abdomen: There is a small hiatal hernia. A predominantly stable 2.3 cm x 2.0 cm low-attenuation (approximately 29.65 Hounsfield units) right adrenal mass is seen. Musculoskeletal: Multiple sternal  wires are present. No acute osseous abnormalities are identified. Review of the MIP images confirms the above findings. IMPRESSION: 1. No evidence of pulmonary embolism. 2. 3.2 cm x 2.0 cm x 2.2 cm heterogeneous soft lung mass within the right upper lobe with innumerable bilateral noncalcified lung nodules of various sizes, consistent with metastatic disease. 3. Moderate to marked severity subcarinal and right hilar lymphadenopathy. 4. Stable right adrenal mass likely consistent with an adrenal adenoma. 5. Small hiatal hernia. Aortic Atherosclerosis (ICD10-I70.0). Electronically Signed   By: Virgina Norfolk M.D.   On: 08/18/2021 21:05   DG Ribs Unilateral W/Chest Left  Result Date: 08/18/2021 CLINICAL DATA:  Left-sided chest pain and shortness of breath since Sunday. EXAM: LEFT RIBS AND CHEST - 3+ VIEW COMPARISON:  07/02/2021 FINDINGS: Frontal view of the chest and two views of left ribs. The frontal view of the chest demonstrates removal of right IJ line. Median sternotomy for CABG. Midline trachea. Normal heart size. No pleural effusion or pneumothorax. Improvement in interstitial edema with mild pulmonary venous congestion remaining. Right upper lobe nodular density again identified and has been detailed on multiple prior radiographs, including 06/26/2021. Left rib radiographs demonstrate no displaced fracture or callus deposition. IMPRESSION: No displaced rib fracture, pleural fluid, or pneumothorax. Mild pulmonary venous congestion in the setting of recent CABG. Right upper lobe lung nodular density, as detailed on multiple prior radiographs. I personally called and  discussed this report with Dr. Alvino Chapel. At 16:02 on 08/18/2021. Electronically Signed   By: Abigail Miyamoto M.D.   On: 08/18/2021 16:17    ASSESSMENT AND PLAN: This is a very pleasant 56 years old African-American female recently diagnosed with a stage IV (T2 a, N2, M1 C) non-small cell lung cancer, adenocarcinoma presented with right upper lobe lung mass in addition to right hilar and mediastinal lymphadenopathy as well as innumerable bilateral pulmonary nodules and bone and liver metastasis diagnosed in August 2023 with positive KRAS G12C mutation and PD-L1 expression of 89%. The patient had a recent PET scan performed.  I personally and independently reviewed the scan images and discussed the result and showed the images to the patient today. Her scan showed the previously known lesions in the lung in addition to multiple bone metastases as well as liver metastasis. The patient has no actionable mutations that could be used as first-line option for treatment. I recommended for her to proceed with systemic chemotherapy with carboplatin for AUC of 5, Alimta 500 Mg/M2 and Keytruda 200 Mg IV every 3 weeks.  She is expected to start the first dose of her treatment today. She will come back for follow-up visit in 1 week for evaluation and management of any toxicity of her treatment. For the bone metastasis, I will consider referring her to radiation oncology if she has any painful bony lesions. The patient is in agreement with the current plan. She was advised to call immediately if she has any other concerning symptoms in the interval. The patient voices understanding of current disease status and treatment options and is in agreement with the current care plan.  All questions were answered. The patient knows to call the clinic with any problems, questions or concerns. We can certainly see the patient much sooner if necessary.  The total time spent in the appointment was 40 minutes.  Disclaimer: This  note was dictated with voice recognition software. Similar sounding words can inadvertently be transcribed and may not be corrected upon review.

## 2021-09-02 NOTE — Progress Notes (Signed)
Pt reported sudden onset of itching when Emend IV infusion began. RN stopped infusion and called Mohamed MD. Per MD 25mg  Benadryl IV given and Emend re-started. Pt tolerated remainder of infusion well.

## 2021-09-03 LAB — T4: T4, Total: 7.1 ug/dL (ref 4.5–12.0)

## 2021-09-04 ENCOUNTER — Other Ambulatory Visit: Payer: Self-pay | Admitting: Family Medicine

## 2021-09-04 ENCOUNTER — Telehealth: Payer: Self-pay | Admitting: Physician Assistant

## 2021-09-04 ENCOUNTER — Other Ambulatory Visit: Payer: Self-pay | Admitting: Pulmonary Disease

## 2021-09-04 ENCOUNTER — Telehealth: Payer: Self-pay

## 2021-09-04 NOTE — Telephone Encounter (Signed)
Scheduled per 08/23 los, patient has been called and notified of upcoming appointments.

## 2021-09-04 NOTE — Telephone Encounter (Signed)
Oncology will usually pick up pain management  BLI

## 2021-09-04 NOTE — Telephone Encounter (Signed)
Pt LVM stating she had questions regarding possible side effects from her tx.  I have called the pt back and she states she felt like her face was a little swollen yesterday and had a "gnawing" headache after tx. She states these sx's have mostly gone away. Pt denies CP, SOB, nausea/vomiting, fever, and diarrhea/constipation. Pt states she thought she was having a reaction but no longer feels that way since there was improvement. Pt understands to immediately go to the ER if she starts to experience fever, CP or change in SOB.

## 2021-09-06 NOTE — Patient Instructions (Addendum)
It was wonderful to see you today.  Please bring ALL of your medications with you to every visit.   Today we talked about:  -I am prescribing some narcotic medication to help with your pain. It can cause significant constipation so please take over the counter MiraLAX daily to help your regulate your bowels. -I am here to support you through this difficult time. Please don't hesitate to reach out -STOP your Trulicity  -Come back in 1-2 months to check in    Thank you for choosing [facility].   Please call [phone] with any questions about today's appointment.  Please be sure to schedule follow up at the front  desk before you leave today.   [name], DO PGY-3 Family Medicine

## 2021-09-06 NOTE — Progress Notes (Unsigned)
    SUBJECTIVE:   CHIEF COMPLAINT / HPI:   CAD s/p CABG x3, Adenocarcinoma  She is s/p CABG x3 in 06/2021 by Dr. Kipp Brood.   Patient was seen in the ED on 8/8 for concerns of chest pain.  ED work-up revealed heterogenous soft lung mass to right upper lobe with innumerable bilateral noncalcified lung nodules of various sizes.  She was discharged from the ED and followed up with pulmonology outpatient on 8/10 and underwent a bronchoscopy on 8/11.  The final pathology unfortunately showed malignant cells consistent with adenocarcinoma. She was seen by oncology on 8/16. Underwent PET scan 8/18 and MRI of brain on 8/22. Given advanced staging, patient was advised all treatments would be palliative in nature.  She was amenable to starting treatment.  She was seen by Dr. Audie Box with Cardiology on 8/21. She had tenderness to palpation of left chest thought to be consistent with costochondritis. She was advised to take 650 mg of ASA TID x7 days and then resume 81 mg daily. Advised to take Protonix with this. ESR and CRP also checked and within normal limits.   Seen for follow up with oncology on 8/23. Her chest discomfort was resolved at this time. PET scan reviewed and showed signs of bony and liver metastasis. She received her first chemotherapy treatment. She will be considered for referral to radiation oncology if she has any painful bony lesions.   Today, patient reports ***   PERTINENT  PMH / PSH: Hypertension, CAD, GERD, T2DM, PTSD, bipolar 1   OBJECTIVE:   LMP 06/12/2016 (Approximate)  ***  General: NAD, pleasant, able to participate in exam Cardiac: RRR, no murmurs. Respiratory: CTAB, normal effort, No wheezes, rales or rhonchi Abdomen: Bowel sounds present, nontender, nondistended, no hepatosplenomegaly. Extremities: no edema or cyanosis. Skin: warm and dry, no rashes noted Neuro: alert, no obvious focal deficits Psych: Normal affect and mood  ASSESSMENT/PLAN:   No  problem-specific Assessment & Plan notes found for this encounter.     [name], Sharon

## 2021-09-07 ENCOUNTER — Encounter: Payer: Self-pay | Admitting: Family Medicine

## 2021-09-07 ENCOUNTER — Ambulatory Visit (INDEPENDENT_AMBULATORY_CARE_PROVIDER_SITE_OTHER): Payer: Medicaid Other | Admitting: Family Medicine

## 2021-09-07 ENCOUNTER — Other Ambulatory Visit: Payer: Self-pay

## 2021-09-07 VITALS — BP 97/63 | HR 89 | Wt 200.6 lb

## 2021-09-07 DIAGNOSIS — Z720 Tobacco use: Secondary | ICD-10-CM | POA: Diagnosis not present

## 2021-09-07 DIAGNOSIS — I10 Essential (primary) hypertension: Secondary | ICD-10-CM | POA: Diagnosis not present

## 2021-09-07 DIAGNOSIS — C3491 Malignant neoplasm of unspecified part of right bronchus or lung: Secondary | ICD-10-CM

## 2021-09-07 DIAGNOSIS — R350 Frequency of micturition: Secondary | ICD-10-CM | POA: Insufficient documentation

## 2021-09-07 LAB — POCT URINALYSIS DIP (MANUAL ENTRY)
Bilirubin, UA: NEGATIVE
Blood, UA: NEGATIVE
Glucose, UA: NEGATIVE mg/dL
Ketones, POC UA: NEGATIVE mg/dL
Leukocytes, UA: NEGATIVE
Nitrite, UA: NEGATIVE
Protein Ur, POC: 100 mg/dL — AB
Spec Grav, UA: 1.02 (ref 1.010–1.025)
Urobilinogen, UA: 0.2 E.U./dL
pH, UA: 5.5 (ref 5.0–8.0)

## 2021-09-07 LAB — POCT UA - MICROSCOPIC ONLY
RBC, Urine, Miroscopic: NONE SEEN (ref 0–2)
WBC, Ur, HPF, POC: NONE SEEN (ref 0–5)

## 2021-09-07 MED ORDER — HYDROCODONE-ACETAMINOPHEN 5-325 MG PO TABS: 1.0000 | ORAL_TABLET | Freq: Three times a day (TID) | ORAL | 0 refills | Status: AC

## 2021-09-07 NOTE — Assessment & Plan Note (Signed)
Congratulated her on cessation.  -Continue nicorette gum as needed for cravings

## 2021-09-07 NOTE — Assessment & Plan Note (Signed)
Followed by Oncology. S/p first chemotherapy treatment. Down 6 lbs since visit on 8/23 and with lack of appetite, body aches.  -Oncology to schedule MRI brain (pt reports recently approved by insurance) -D/C trulicity given weight loss/lack of appetite. Suspect AE from chemo but don't want to contribute to this with medication -Continue to discuss goals of care with patient -Given her advanced staging and significant pain, I am starting her on Vicodin 5-325 mg q8h PRN. Advised her to take MiraLAX daily

## 2021-09-07 NOTE — Assessment & Plan Note (Signed)
BP now soft. She has been holding her Losartan.  -Continue to hold Losartan for soft BP

## 2021-09-07 NOTE — Assessment & Plan Note (Signed)
At the end of the visit patient reported urinary frequency, feels like she is going to the bathroom every 15 minutes.  UA performed in she has no signs of infection. No glucose either and her last A1c was well controlled so I doubt this is related to diabetes. Discussed could be AE of chemo, or pelvic muscle dysfunction. Can continue conservative therapy- return if worsening.

## 2021-09-08 ENCOUNTER — Other Ambulatory Visit: Payer: Self-pay | Admitting: Family Medicine

## 2021-09-08 ENCOUNTER — Inpatient Hospital Stay: Payer: Medicaid Other | Admitting: Licensed Clinical Social Worker

## 2021-09-08 DIAGNOSIS — C3491 Malignant neoplasm of unspecified part of right bronchus or lung: Secondary | ICD-10-CM

## 2021-09-08 NOTE — Progress Notes (Signed)
[facility]  Initial Assessment   Cassidy [name] is a 56 y.o. year old female contacted by phone. Clinical Social Work was referred by medical provider for assessment of psychosocial needs.   SDOH (Social Determinants of Health) assessments performed: Yes   SDOH Screenings   Alcohol Screen: Not on file  Depression (PHQ2-9): Medium Risk (09/07/2021)   Depression (PHQ2-9)    PHQ-2 Score: 5  Financial Resource Strain: Not on file  Food Insecurity: Not on file  Housing: Not on file  Physical Activity: Not on file  Social Connections: Not on file  Stress: Not on file  Tobacco Use: Medium Risk (09/07/2021)   Patient History    Smoking Tobacco Use: Former    Smokeless Tobacco Use: Never    Passive Exposure: Not on file  Transportation Needs: Not on file     Distress Screen completed: No     No data to display            Family/Social Information:  Housing Arrangement: patient lives alonewith her cat w/ her son Cassidy [name] residing close by. Family members/support persons in your life? Family, pt's son as well as her siblings are all supportive. Transportation concerns: Pt does not have transportation concerns at present, but states her car does need some maintenance which she is unable to afford at present and my cause transportation issues in the future.  Employment: Unemployed , pt approved for SSI.  Income source: Theatre stage manager Income Financial concerns: Yes, current concerns Type of concern: Utilities, Rent/ mortgage, Transportation, and Medical bills Food access concerns: no Religious or spiritual practice: Yes-Christian Services Currently in place:  Medicaid/SSI  Coping/ Adjustment to diagnosis: Patient understands treatment plan and what happens next? yes Concerns about diagnosis and/or treatment: Feelings of anger or sadness, Overwhelmed by information, How I will pay for the services I need, How will I care for myself, and Quality of  life Patient reported stressors: Adjusting to my illness Hopes and/or priorities: Pt's priority is to start treatment w/ the hope of extending a good quality of life.  Pt expressed anger and frustration at the time it has taken for her to be diagnosed as she has been seeing doctors consistently and feels as though she has at times not been taken seriously and her concerns were either ignored or not adequately investigated, which has now resulted in a late stage cancer diagnosis.  Pt also expressed frustration regarding the limitations Medicaid places on the services and diagnostic tests pts are approved for.  Pt has needed to appeal and advocate for herself in order to get a necessary MRI. Patient enjoys time with family/ friends Current coping skills/ strengths: Capable of independent living , Motivation for treatment/growth , Religious Affiliation , and Supportive family/friends     SUMMARY: Current SDOH Barriers:  Financial constraints related to inadequate insurance and fixed, limited income.  Clinical Social Work Clinical Goal(s):  Explore community resource options for unmet needs related to:  Financial Strain   Interventions: Discussed common feeling and emotions when being diagnosed with cancer, and the importance of support during treatment Informed patient of the support team roles and support services at [facility] Provided [place] contact information and encouraged patient to call with any questions or concerns Patient has been approved for the [employer].  CSW to meet w/ pt in infusion during next treatment to have [place] application signed and provide pt w/ a [employer] card.   Follow Up Plan: CSW will see  patient on 09/24/2021 Patient verbalizes understanding of plan: Yes    [name], LCSW   Patient is participating in a Managed Medicaid Plan:  Yes

## 2021-09-09 ENCOUNTER — Other Ambulatory Visit: Payer: Self-pay | Admitting: *Deleted

## 2021-09-09 ENCOUNTER — Inpatient Hospital Stay: Payer: Medicaid Other

## 2021-09-09 ENCOUNTER — Inpatient Hospital Stay (HOSPITAL_BASED_OUTPATIENT_CLINIC_OR_DEPARTMENT_OTHER): Payer: Medicaid Other | Admitting: Internal Medicine

## 2021-09-09 ENCOUNTER — Other Ambulatory Visit: Payer: Self-pay

## 2021-09-09 VITALS — BP 110/72 | HR 87 | Temp 98.1°F | Resp 16 | Ht 67.5 in | Wt 200.9 lb

## 2021-09-09 DIAGNOSIS — C3491 Malignant neoplasm of unspecified part of right bronchus or lung: Secondary | ICD-10-CM

## 2021-09-09 DIAGNOSIS — Z5112 Encounter for antineoplastic immunotherapy: Secondary | ICD-10-CM | POA: Diagnosis not present

## 2021-09-09 DIAGNOSIS — Z5111 Encounter for antineoplastic chemotherapy: Secondary | ICD-10-CM

## 2021-09-09 LAB — CBC WITH DIFFERENTIAL (CANCER CENTER ONLY)
Abs Immature Granulocytes: 0.02 10*3/uL (ref 0.00–0.07)
Basophils Absolute: 0 10*3/uL (ref 0.0–0.1)
Basophils Relative: 1 %
Eosinophils Absolute: 0.3 10*3/uL (ref 0.0–0.5)
Eosinophils Relative: 7 %
HCT: 35.9 % — ABNORMAL LOW (ref 36.0–46.0)
Hemoglobin: 12.4 g/dL (ref 12.0–15.0)
Immature Granulocytes: 1 %
Lymphocytes Relative: 55 %
Lymphs Abs: 2 10*3/uL (ref 0.7–4.0)
MCH: 29 pg (ref 26.0–34.0)
MCHC: 34.5 g/dL (ref 30.0–36.0)
MCV: 83.9 fL (ref 80.0–100.0)
Monocytes Absolute: 0.1 10*3/uL (ref 0.1–1.0)
Monocytes Relative: 3 %
Neutro Abs: 1.2 10*3/uL — ABNORMAL LOW (ref 1.7–7.7)
Neutrophils Relative %: 33 %
Platelet Count: 209 10*3/uL (ref 150–400)
RBC: 4.28 MIL/uL (ref 3.87–5.11)
RDW: 13.3 % (ref 11.5–15.5)
WBC Count: 3.5 10*3/uL — ABNORMAL LOW (ref 4.0–10.5)
nRBC: 0 % (ref 0.0–0.2)

## 2021-09-09 LAB — CMP (CANCER CENTER ONLY)
ALT: 14 U/L (ref 0–44)
AST: 14 U/L — ABNORMAL LOW (ref 15–41)
Albumin: 4.1 g/dL (ref 3.5–5.0)
Alkaline Phosphatase: 83 U/L (ref 38–126)
Anion gap: 8 (ref 5–15)
BUN: 15 mg/dL (ref 6–20)
CO2: 24 mmol/L (ref 22–32)
Calcium: 9.1 mg/dL (ref 8.9–10.3)
Chloride: 109 mmol/L (ref 98–111)
Creatinine: 0.69 mg/dL (ref 0.44–1.00)
GFR, Estimated: >60 mL/min (ref >60)
Glucose, Bld: 139 mg/dL — ABNORMAL HIGH (ref 70–99)
Potassium: 4 mmol/L (ref 3.5–5.1)
Sodium: 141 mmol/L (ref 135–145)
Total Bilirubin: 0.6 mg/dL (ref 0.3–1.2)
Total Protein: 7.4 g/dL (ref 6.5–8.1)

## 2021-09-09 LAB — TSH: TSH: 1.609 u[IU]/mL (ref 0.350–4.500)

## 2021-09-09 LAB — URINE CULTURE

## 2021-09-09 NOTE — Progress Notes (Signed)
[facility] [phone]   Fax:([phone]) [place], [place] [address]  DIAGNOSIS: stage IV (T2a, N2, M1c) non-small cell lung cancer, adenocarcinoma presented with right upper lobe lung mass in addition to right hilar and mediastinal lymphadenopathy and innumerable bilateral pulmonary nodules as well as liver and bone metastasis diagnosed in August 2023.   Detected Alteration(s) / Biomarker(s) Associated FDA-approved therapies Clinical Trial Availability % cfDNA or Amplification  KRAS G12C  approved by FDA Adagrasib, Sotorasib Yes 5.3%  IDH1 R132L  approved in other indication Ivosidenib, Olutasidenib Yes 1.9%  PD-L1 expression 89%  PRIOR THERAPY: None  CURRENT THERAPY: Systemic chemotherapy with carboplatin for AUC of 5, Alimta 500 Mg/M2 and Keytruda 200 Mg IV every 3 weeks.  First dose September 02, 2021.  Status post 1 cycle.   INTERVAL HISTORY: Cassidy [name] 56 y.o. female returns to the clinic today for follow-up visit.  The patient is feeling fine today with no concerning complaints except for mild fatigue.  She has several days of weakness and fatigue after the first dose of her treatment but she is feeling much better and now she eats well.  She denied having any current chest pain and she mentioned that the pain on the left side of the chest improved after her first dose of this treatment.  She has no current nausea, vomiting, diarrhea or constipation.  She has no headache or visual changes.  She has no recent fever or chills.  She is here today for evaluation and repeat blood work.  She is very upset with her insurance company who declined for her to get MRI of the brain to complete the staging work-up after initial approval.  She was supposed to get it done tomorrow.  MEDICAL HISTORY: Past Medical History:  Diagnosis Date   Adenocarcinoma of right lung, stage 4 ([facility]) 08/26/2021    Anemia    has sickle cell trait   Atherosclerotic heart disease of native coronary artery with angina pectoris ([facility]) 05/2012, 10/2013   a.  s/p PCTA to dRCA and ostial RPAV-PLA vessel + PDA branch (05/2012)  b. [place] s/p DES to mLAD - Resolute DES 3.0 x 22 (3.84m -->3.3 mm)   Bipolar depression ([facility]    Breast abscess    a. right side.    COPD (chronic obstructive pulmonary disease) (HCC)    Depression    Diabetic peripheral neuropathy (HCC)    Fibromyalgia    Genital warts    GERD (gastroesophageal reflux disease)    History of hiatal hernia    HLD (hyperlipidemia)    Hypertension    Pain in limb    a. LE VENOUS DUPLEX, 02/05/2009 - no evidence of deep vein thrombosis, Baker's cyst   Pneumonia    PTSD (post-traumatic stress disorder)    Sickle cell trait (HCC)    Tobacco abuse    Tooth caries    Type II diabetes mellitus (HCC)     ALLERGIES:  is allergic to amoxil [amoxicillin], chantix [varenicline], suboxone [buprenorphine hcl-naloxone hcl], and emend [fosaprepitant dimeglumine].  MEDICATIONS:  Current Outpatient Medications  Medication Sig Dispense Refill   ACCU-CHEK AVIVA PLUS test strip TEST BLOOD SUGAR THREE TIMES DAILY AS DIRECTED 50 each 1   amLODipine (NORVASC) 5 MG tablet Take 1 tablet (5 mg total) by mouth at bedtime. 90 tablet 3   aspirin EC 81 MG tablet Take 81 mg by mouth daily.  atorvastatin (LIPITOR) 40 MG tablet TAKE 1 TABLET (40 MG TOTAL) BY MOUTH DAILY. 90 tablet 3   busPIRone (BUSPAR) 15 MG tablet TAKE 1/2 TABLET (7.5 MG TOTAL) BY MOUTH TWICE DAILY (Patient taking differently: Take 7.5 mg by mouth 2 (two) times daily.) 30 tablet 11   Carboxymethylcellul-Glycerin (LUBRICATING EYE DROPS OP) Place 1 drop into both eyes daily as needed (dry eyes).     carvedilol (COREG) 12.5 MG tablet Take 1 tablet (12.5 mg total) by mouth 2 (two) times daily with a meal. 180 tablet 1   diclofenac Sodium (VOLTAREN) 1 % GEL Apply 1 application topically 3 times daily as needed for  pain 2 g 3   diphenhydramine-acetaminophen (TYLENOL PM) 25-500 MG TABS tablet Take 1 tablet by mouth at bedtime as needed (sleep). (Patient not taking: Reported on 09/07/2021)     Dulaglutide (TRULICITY) 4.28 JG/8.1LX SOPN Inject 0.75 mg into the skin once a week. (Patient taking differently: Inject 0.75 mg into the skin every Monday.) 2 mL 0   ferrous sulfate 325 (65 FE) MG tablet Take 325 mg by mouth daily with breakfast.     fluticasone (FLONASE) 50 MCG/ACT nasal spray PLACE 1 SPRAY INTO BOTH NOSTRILS DAILY AS NEEDED (CONGESTION). 16 g 1   fluticasone-salmeterol (ADVAIR) 100-50 MCG/ACT AEPB Inhale 1 puff into the lungs 2 (two) times daily. 1 each 3   folic acid (FOLVITE) 1 MG tablet Take 1 tablet (1 mg total) by mouth daily. 30 tablet 3   furosemide (LASIX) 40 MG tablet Take 1 tablet (40 mg total) by mouth daily as needed. If you gain 3 lbs in 24 hours or 5 lbs in 1 week (Patient not taking: Reported on 09/07/2021) 30 tablet 1   HYDROcodone-acetaminophen (NORCO/VICODIN) 5-325 MG tablet Take 1 tablet by mouth every 8 (eight) hours. 90 tablet 0   ipratropium (ATROVENT HFA) 17 MCG/ACT inhaler Inhale 2 puffs into the lungs every 4 (four) hours as needed for wheezing (COPD).     isosorbide mononitrate (IMDUR) 30 MG 24 hr tablet Take 30 mg by mouth daily. (Patient not taking: Reported on 08/31/2021)     LINZESS 290 MCG CAPS capsule TAKE 1 CAPSULE (290 MCG TOTAL) BY MOUTH DAILY BEFORE BREAKFAST. (Patient not taking: Reported on 09/07/2021) 30 capsule 3   losartan (COZAAR) 50 MG tablet Take 50 mg by mouth daily. (Patient not taking: Reported on 09/07/2021)     methocarbamol (ROBAXIN) 500 MG tablet Take 1 tablet (500 mg total) by mouth every 8 (eight) hours as needed for muscle spasms. (Patient not taking: Reported on 09/07/2021) 20 tablet 0   nicotine polacrilex (NICORETTE) 2 MG gum Take 1 each (2 mg total) by mouth as needed for smoking cessation. 100 tablet 0   nitroGLYCERIN (NITROSTAT) 0.4 MG SL tablet Place  1 tablet (0.4 mg total) under the tongue every 5 (five) minutes as needed for chest pain. 25 tablet 5   oxyCODONE-acetaminophen (PERCOCET) 5-325 MG tablet Take 1-2 tablets by mouth every 6 (six) hours as needed for severe pain. (Patient not taking: Reported on 09/07/2021) 45 tablet 0   pantoprazole (PROTONIX) 40 MG tablet TAKE 1 TABLET (40 MG TOTAL) BY MOUTH DAILY. 30 tablet 1   potassium chloride SA (KLOR-CON M) 20 MEQ tablet Take 1 tablet (20 mEq total) by mouth daily as needed. On days you take Lasix (Patient not taking: Reported on 09/07/2021) 30 tablet 1   prochlorperazine (COMPAZINE) 10 MG tablet Take 1 tablet (10 mg total) by mouth every 6 (six)  hours as needed for nausea or vomiting. 30 tablet 0   sertraline (ZOLOFT) 50 MG tablet TAKE 1 TABLET (50 MG TOTAL) BY MOUTH DAILY. 30 tablet 3   valACYclovir (VALTREX) 1000 MG tablet TAKE FOR 3 DAYS AS NEEDED FOR EACH OUTBREAK 30 tablet 11   vitamin B-12 (CYANOCOBALAMIN) 1000 MCG tablet Take 1,000 mcg by mouth daily.     No current facility-administered medications for this visit.    SURGICAL HISTORY:  Past Surgical History:  Procedure Laterality Date   BLADDER SURGERY  1980   "TVT"   BLADDER SURGERY  2003   "TVT"   BREAST EXCISIONAL BIOPSY     BREAST SURGERY Right    I&D for multiple abscesses   CARDIAC CATHETERIZATION     CORONARY ARTERY BYPASS GRAFT N/A 06/30/2021   Procedure: CORONARY ARTERY BYPASS GRAFTING (CABG) x3 USING LEFT INTERNAL MAMMARY ARTERY,  RIGHT RADIAL ARTERY AND LEFT GREATER SAPHENOUS VEIN;  Surgeon: Lajuana Matte, MD;  Location: [facility];  Service: Open Heart Surgery;  Laterality: N/A;   cutting balloon     ENDOVEIN HARVEST OF GREATER SAPHENOUS VEIN Left 06/30/2021   Procedure: ENDOVEIN HARVEST OF GREATER SAPHENOUS VEIN;  Surgeon: Lajuana Matte, MD;  Location: [employer] [facility];  Service: Open Heart Surgery;  Laterality: Left;   EYE SURGERY Left    laser surgery   FINE NEEDLE ASPIRATION  08/21/2021   Procedure: FINE NEEDLE  ASPIRATION (FNA) LINEAR;  Surgeon: Garner Nash, DO;  Location: [facility];  Service: Pulmonary;;   INCISE AND DRAIN ABCESS  ; 04/26/11; 08/13/11   right breast   INCISION AND DRAINAGE ABSCESS Right 05/09/2012   Procedure: INCISION AND DRAINAGE RIGHT BREAST ABSCESS;  Surgeon: Imogene Burn. Georgette Dover, MD;  Location: [facility];  Service: General;  Laterality: Right;   INCISION AND DRAINAGE ABSCESS Right 02/08/2014   Procedure: INCISION AND DRAINAGE RIGHT BREAST ABSCESS;  Surgeon: Donnie Mesa, MD;  Location: [facility];  Service: General;  Laterality: Right;   INCISION AND DRAINAGE ABSCESS Right 06/14/2014   Procedure: INCISION AND DRAINAGE RIGHT BREAST ABSCESS;  Surgeon: Donnie Mesa, MD;  Location: [facility];  Service: General;  Laterality: Right;   IRRIGATION AND DEBRIDEMENT ABSCESS  12/19/2010   Procedure: IRRIGATION AND DEBRIDEMENT ABSCESS;  Surgeon: Rolm Bookbinder, MD;  Location: [facility];  Service: General;  Laterality: Right;   IRRIGATION AND DEBRIDEMENT ABSCESS  08/13/2011   Procedure: MINOR INCISION AND DRAINAGE OF ABSCESS;  Surgeon: Gwenyth Ober, MD;  Location: [facility];  Service: General;  Laterality: Right;  Right Breast    IRRIGATION AND DEBRIDEMENT ABSCESS  11/17/2011   Procedure: IRRIGATION AND DEBRIDEMENT ABSCESS;  Surgeon: Imogene Burn. Georgette Dover, MD;  Location: [facility];  Service: General;  Laterality: Right;  irrigation and debridement right recurrent breast abscess   LARYNX SURGERY     LEFT HEART CATH AND CORONARY ANGIOGRAPHY N/A 04/09/2021   Procedure: LEFT HEART CATH AND CORONARY ANGIOGRAPHY;  Surgeon: Jettie Booze, MD;  Location: [facility];  Service: Cardiovascular;  Laterality: N/A;   LEFT HEART CATHETERIZATION WITH CORONARY ANGIOGRAM N/A 05/29/2012   Procedure: LEFT HEART CATHETERIZATION WITH CORONARY ANGIOGRAM & PTCA;  Surgeon: Troy Sine, MD;  Location[facility];  Service: Cardiovascular; Bifurcation dRCA-RPAD/PLA & rPDA  PTCA.   LEFT HEART CATHETERIZATION WITH CORONARY  ANGIOGRAM N/A 11/08/2013   Procedure: LEFT HEART CATHETERIZATION WITH CORONARY ANGIOGRAM and Coronary Stent Intervention;  Surgeon: Leonie Man, MD;  Location: [facility];  Service: Cardiovascular; mLAD  80% (FFR 0.73) - resolute DES 3.0 x 22 mm (postdilated to 3.3 mm->3.5 mm)   NM MYOVIEW LTD  01/26/2009   Normal study, no evidence of ischemia, EF 67%   ployp removed from voice box 03/30/12     RADIAL ARTERY HARVEST Right 06/30/2021   Procedure: RADIAL ARTERY HARVEST;  Surgeon: Lajuana Matte, MD;  Location: [facility];  Service: Open Heart Surgery;  Laterality: Right;   REFRACTIVE SURGERY  ~ 2010   right   TEE WITHOUT CARDIOVERSION N/A 06/30/2021   Procedure: TRANSESOPHAGEAL ECHOCARDIOGRAM (TEE);  Surgeon: Lajuana Matte, MD;  Location: [facility];  Service: Open Heart Surgery;  Laterality: N/A;   TONSILLECTOMY  1988   VIDEO BRONCHOSCOPY WITH ENDOBRONCHIAL ULTRASOUND Bilateral 08/21/2021   Procedure: VIDEO BRONCHOSCOPY WITH ENDOBRONCHIAL ULTRASOUND;  Surgeon: Garner Nash, DO;  Location: [facility];  Service: Pulmonary;  Laterality: Bilateral;    REVIEW OF SYSTEMS:  A comprehensive review of systems was negative except for: Constitutional: positive for fatigue and weight loss   PHYSICAL EXAMINATION: General appearance: alert, cooperative, fatigued, and no distress Head: Normocephalic, without obvious abnormality, atraumatic Neck: no adenopathy, no JVD, supple, symmetrical, trachea midline, and thyroid not enlarged, symmetric, no tenderness/mass/nodules Lymph nodes: Cervical, supraclavicular, and axillary nodes normal. Resp: clear to auscultation bilaterally Back: symmetric, no curvature. ROM normal. No CVA tenderness. Cardio: regular rate and rhythm, S1, S2 normal, no murmur, click, rub or gallop GI: soft, non-tender; bowel sounds normal; no masses,  no organomegaly Extremities: extremities normal, atraumatic, no cyanosis or edema  ECOG PERFORMANCE STATUS: 1 - Symptomatic but  completely ambulatory  Blood pressure 110/72, pulse 87, temperature 98.1 F (36.7 C), temperature source Oral, resp. rate 16, height 5' 7.5" (1.715 m), weight 200 lb 14.4 oz (91.1 kg), last menstrual period 06/12/2016, SpO2 100 %.  LABORATORY DATA: Lab Results  Component Value Date   WBC 10.1 09/02/2021   HGB 12.5 09/02/2021   HCT 36.1 09/02/2021   MCV 85.7 09/02/2021   PLT 351 09/02/2021      Chemistry      Component Value Date/Time   NA 138 09/02/2021 1007   NA 140 04/03/2021 1059   K 3.6 09/02/2021 1007   CL 106 09/02/2021 1007   CO2 28 09/02/2021 1007   BUN 10 09/02/2021 1007   BUN 13 04/03/2021 1059   CREATININE 0.67 09/02/2021 1007   CREATININE 0.65 11/06/2013 1520      Component Value Date/Time   CALCIUM 9.3 09/02/2021 1007   ALKPHOS 91 09/02/2021 1007   AST 11 (L) 09/02/2021 1007   ALT 10 09/02/2021 1007   BILITOT 0.6 09/02/2021 1007       RADIOGRAPHIC STUDIES: NM PET Image Initial (PI) Skull Base To Thigh (F-18 FDG)  Result Date: 08/31/2021 CLINICAL DATA:  Initial treatment strategy for non-small cell right lung cancer. Adenocarcinoma by 08/21/2021 FNA. EXAM: NUCLEAR MEDICINE PET SKULL BASE TO THIGH TECHNIQUE: 10.1 mCi F-18 FDG was injected intravenously. Full-ring PET imaging was performed from the skull base to thigh after the radiotracer. CT data was obtained and used for attenuation correction and anatomic localization. Fasting blood glucose: 149 mg/dl COMPARISON:  08/18/2021 chest CT angiogram. 05/22/2015 CT abdomen/pelvis. FINDINGS: Mediastinal blood pool activity: SUV max 2.9 Liver activity: SUV max NA NECK: Enlarged hypermetabolic bilateral supraclavicular neck lymph nodes, largest 1.3 cm on the left with max SUV 7.5 (series 4/image 44). Incidental CT findings: None. CHEST: Hypermetabolic solid 3.4 x 2.3 cm right upper lobe lung mass with max SUV  9.7 (series 7/image 18). Innumerable (greater than 20) solid pulmonary nodules scattered throughout both lungs  involving all lung lobes, several of which demonstrate hypermetabolism, for example measuring 1.2 cm in the right upper lobe with max SUV 4.6 (series 7/image 25) and measuring 1.2 cm in the posterior left upper lobe with max SUV 3.2 (series 7/image 33). Bulky hypermetabolic right hilar lymphadenopathy measuring 3.1 cm short axis diameter with max SUV 9.4 (series 4/image 67). Enlarged hypermetabolic 1.5 cm subcarinal node with max SUV 8.9 (series 4/image 69). Enlarged hypermetabolic 1.3 cm right paratracheal node with max SUV 9.2 (series 4/image 63). Hypermetabolic high left mediastinal prevascular 0.8 cm node with max SUV 5.0 (series 4/image 48). No hypermetabolic left hilar nodes. Incidental CT findings: Coronary atherosclerosis status post CABG. Atherosclerotic nonaneurysmal thoracic aorta. Incompletely healed median sternotomy with intact median sternotomy wires. Hypermetabolism along the median sternotomy site without discrete osseous lesions. ABDOMEN/PELVIS: Hypermetabolic 1.5 cm far inferior right liver mass with max SUV 6.6 (series 4/image 128). Hypermetabolic 1.9 cm segment 3 left liver mass with max SUV 9.3 (series 4/image 104). No abnormal hypermetabolic activity within the pancreas, adrenal glands, or spleen. No hypermetabolic lymph nodes in the abdomen or pelvis. Incidental CT findings: Right adrenal 2.6 cm nodule with density 22 HU, stable since 05/22/2015 CT abdomen, compatible with a benign adenoma. Atherosclerotic nonaneurysmal abdominal aorta. SKELETON: Several scattered hypermetabolic lytic skeletal lesions to the spine, ribs, scapula, pelvis and right femur. Representative lesions as follows: -proximal right femoral metaphysis lesion with max SUV 6.5 -midline upper sacral lesion with max SUV 10.1 -right L4 vertebral lesion with max SUV 8.8 -lateral left sixth rib lesion with max SUV 7.7 Incidental CT findings: None. IMPRESSION: 1. Hypermetabolic solid 3.4 cm right upper lobe lung mass compatible  with primary bronchogenic malignancy. 2. Hypermetabolic metastatic adenopathy to the ipsilateral hilum, bilateral mediastinum and bilateral supraclavicular neck. 3. Two hypermetabolic liver metastases. 4. Several scattered hypermetabolic lytic skeletal metastases to the axial and proximal appendicular skeleton. 5. Chronic findings include: Aortic Atherosclerosis (ICD10-I70.0). Right adrenal nodule stable since 2017 CT, favoring benign adenoma. Electronically Signed   By: Ilona Sorrel M.D.   On: 08/31/2021 16:24   CT Angio Chest PE W and/or Wo Contrast  Result Date: 08/18/2021 CLINICAL DATA:  Left-sided chest pain. EXAM: CT ANGIOGRAPHY CHEST WITH CONTRAST TECHNIQUE: Multidetector CT imaging of the chest was performed using the standard protocol during bolus administration of intravenous contrast. Multiplanar CT image reconstructions and MIPs were obtained to evaluate the vascular anatomy. RADIATION DOSE REDUCTION: This exam was performed according to the departmental dose-optimization program which includes automated exposure control, adjustment of the mA and/or kV according to patient size and/or use of iterative reconstruction technique. CONTRAST:  13m OMNIPAQUE IOHEXOL 350 MG/ML SOLN COMPARISON:  May 26, 2012 and May 22, 2015 FINDINGS: Cardiovascular: There is mild calcification of the thoracic aorta, without evidence of aortic aneurysm. Satisfactory opacification of the pulmonary arteries to the segmental level. No evidence of pulmonary embolism. Normal heart size with marked severity coronary artery calcification. A very small, predominantly anterior pericardial effusion is noted. Mediastinum/Nodes: There is moderate to marked severity subcarinal and right hilar lymphadenopathy. Thyroid gland, trachea, and esophagus demonstrate no significant findings. Lungs/Pleura: A 3.2 cm x 2.0 cm x 2.2 cm heterogeneous soft lung mass is seen within the right upper lobe. Innumerable bilateral noncalcified lung nodules of  various sizes are also noted. There is no evidence of acute infiltrate, pleural effusion or pneumothorax. Upper Abdomen: There is a small hiatal  hernia. A predominantly stable 2.3 cm x 2.0 cm low-attenuation (approximately 29.65 Hounsfield units) right adrenal mass is seen. Musculoskeletal: Multiple sternal wires are present. No acute osseous abnormalities are identified. Review of the MIP images confirms the above findings. IMPRESSION: 1. No evidence of pulmonary embolism. 2. 3.2 cm x 2.0 cm x 2.2 cm heterogeneous soft lung mass within the right upper lobe with innumerable bilateral noncalcified lung nodules of various sizes, consistent with metastatic disease. 3. Moderate to marked severity subcarinal and right hilar lymphadenopathy. 4. Stable right adrenal mass likely consistent with an adrenal adenoma. 5. Small hiatal hernia. Aortic Atherosclerosis (ICD10-I70.0). Electronically Signed   By: Virgina Norfolk M.D.   On: 08/18/2021 21:05   DG Ribs Unilateral W/Chest Left  Result Date: 08/18/2021 CLINICAL DATA:  Left-sided chest pain and shortness of breath since Sunday. EXAM: LEFT RIBS AND CHEST - 3+ VIEW COMPARISON:  07/02/2021 FINDINGS: Frontal view of the chest and two views of left ribs. The frontal view of the chest demonstrates removal of right IJ line. Median sternotomy for CABG. Midline trachea. Normal heart size. No pleural effusion or pneumothorax. Improvement in interstitial edema with mild pulmonary venous congestion remaining. Right upper lobe nodular density again identified and has been detailed on multiple prior radiographs, including 06/26/2021. Left rib radiographs demonstrate no displaced fracture or callus deposition. IMPRESSION: No displaced rib fracture, pleural fluid, or pneumothorax. Mild pulmonary venous congestion in the setting of recent CABG. Right upper lobe lung nodular density, as detailed on multiple prior radiographs. I personally called and discussed this report with Dr.  Alvino Chapel. At 16:02 on 08/18/2021. Electronically Signed   By: Abigail Miyamoto M.D.   On: 08/18/2021 16:17    ASSESSMENT AND PLAN: This is a very pleasant 56 years old African-American female recently diagnosed with a stage IV (T2 a, N2, M1 C) non-small cell lung cancer, adenocarcinoma presented with right upper lobe lung mass in addition to right hilar and mediastinal lymphadenopathy as well as innumerable bilateral pulmonary nodules and bone and liver metastasis diagnosed in August 2023 with positive KRAS G12C mutation and PD-L1 expression of 89%. The patient had a recent PET scan performed.  I personally and independently reviewed the scan images and discussed the result and showed the images to the patient today. Her scan showed the previously known lesions in the lung in addition to multiple bone metastases as well as liver metastasis. The patient has no actionable mutations that could be used as first-line option for treatment. She is currently undergoing systemic chemotherapy with carboplatin for AUC of 5, Alimta 500 Mg/M2 and Keytruda 200 Mg IV every 3 weeks status post 1 cycle.  The patient tolerated the first week of her treatment well except for fatigue and lack of appetite for few days after the treatment.  She is feeling much better now. I recommended for her to continue her treatment as planned and she is expected to start cycle #2 in 2 weeks. The patient was supposed to have MRI of the brain performed tomorrow but for some reason it was declined by her insurance after initial approval.  She will call her case manager for further details. For the bone metastasis, we will continue to monitor for now and may consider The patient for treatment with [facility] if she has a dental clearance. The patient will come back for follow-up visit in 2 weeks for evaluation before starting cycle #2.  The patient voices understanding of current disease status and treatment options and is in agreement  with the current  care plan.  All questions were answered. The patient knows to call the clinic with any problems, questions or concerns. We can certainly see the patient much sooner if necessary.  The total time spent in the appointment was 30 minutes.  Disclaimer: This note was dictated with voice recognition software. Similar sounding words can inadvertently be transcribed and may not be corrected upon review.

## 2021-09-10 ENCOUNTER — Ambulatory Visit (HOSPITAL_COMMUNITY): Payer: Medicaid Other

## 2021-09-10 ENCOUNTER — Encounter (HOSPITAL_COMMUNITY): Payer: Self-pay

## 2021-09-10 ENCOUNTER — Encounter: Payer: Self-pay | Admitting: Licensed Clinical Social Worker

## 2021-09-10 ENCOUNTER — Telehealth: Payer: Self-pay | Admitting: Medical Oncology

## 2021-09-10 DIAGNOSIS — C3491 Malignant neoplasm of unspecified part of right bronchus or lung: Secondary | ICD-10-CM

## 2021-09-10 LAB — T4: T4, Total: 7.6 ug/dL (ref 4.5–12.0)

## 2021-09-10 NOTE — Telephone Encounter (Signed)
Pt needs PA for MRI ordered.  [employer] Cassidy [name]) requested a call @ [phone].

## 2021-09-10 NOTE — Progress Notes (Signed)
Cassidy [name] Progress Note  Holiday representative met with patient to obtain signature for [employer] application.  Application submitted on behalf of pt.  [name] provided pt w/ a [employer] card for gas as well.      Cassidy Combs, LCSW    Patient is participating in a Managed Medicaid Plan:  Yes

## 2021-09-11 ENCOUNTER — Telehealth: Payer: Self-pay | Admitting: Pulmonary Disease

## 2021-09-11 ENCOUNTER — Telehealth: Payer: Self-pay | Admitting: Medical Oncology

## 2021-09-11 ENCOUNTER — Other Ambulatory Visit: Payer: Self-pay | Admitting: Medical Oncology

## 2021-09-11 DIAGNOSIS — C3490 Malignant neoplasm of unspecified part of unspecified bronchus or lung: Secondary | ICD-10-CM

## 2021-09-11 NOTE — Telephone Encounter (Signed)
I told pt that her MRI was ordered by [facility] and it needs to be PA.  It may take 7-10 days for a decision to be made.

## 2021-09-11 NOTE — Telephone Encounter (Signed)
Diane with Dr. Worthy Flank office returned call. She stated that they had tried to get the MRI authorized so they could get it scheduled for pt but then saw that the authorization had not been done. She asked if the order that BI had placed could be cancelled so that way Dr. Julien Nordmann could place a new order to try to get authorization done to get it scheduled for pt to have done. Order cancelled. Nothing further needed.

## 2021-09-11 NOTE — Telephone Encounter (Signed)
Attempted to call Diane with Dr. Worthy Flank office but unable to reach. Left message for her to return call.

## 2021-09-16 ENCOUNTER — Telehealth: Payer: Self-pay

## 2021-09-16 NOTE — Telephone Encounter (Signed)
Additional information has been received regarding pts MRI order. There were orders from more than one provider for the MRI. Pt received a denial letter which caused her to then contact multiple offices to initiate investigations. Unfortunately, this seems to have confused some things.  At this time, the other office has cancelled their order and the only order remaining is from Dr. Julien Nordmann. This order was initially denied but automatically appealed on 09/11/21 and is pending at this time. I have provided the pt an update with this information and she understands not to schedule the appointment until she is contacted and advised of an approval/denial. Pt expressed understanding of this information.  Also, our prior authorization contact has advised she will update [place] once she has received an update from pts insurance carrier.

## 2021-09-16 NOTE — Telephone Encounter (Signed)
Pt called stating she has a letter from her insurance carrier indicating her MRI has been approved. Pt has been provided the contact number to Radiology Scheduling.

## 2021-09-17 ENCOUNTER — Encounter (HOSPITAL_COMMUNITY): Payer: Self-pay

## 2021-09-22 ENCOUNTER — Ambulatory Visit (HOSPITAL_COMMUNITY): Payer: Medicaid Other

## 2021-09-23 MED FILL — Fosaprepitant Dimeglumine For IV Infusion 150 MG (Base Eq): INTRAVENOUS | Qty: 5 | Status: AC

## 2021-09-23 MED FILL — Dexamethasone Sodium Phosphate Inj 100 MG/10ML: INTRAMUSCULAR | Qty: 1 | Status: AC

## 2021-09-24 ENCOUNTER — Inpatient Hospital Stay: Payer: Medicaid Other | Attending: Internal Medicine

## 2021-09-24 ENCOUNTER — Encounter: Payer: Self-pay | Admitting: General Practice

## 2021-09-24 ENCOUNTER — Inpatient Hospital Stay: Payer: Medicaid Other | Admitting: Licensed Clinical Social Worker

## 2021-09-24 ENCOUNTER — Inpatient Hospital Stay (HOSPITAL_BASED_OUTPATIENT_CLINIC_OR_DEPARTMENT_OTHER): Payer: Medicaid Other | Admitting: Internal Medicine

## 2021-09-24 ENCOUNTER — Other Ambulatory Visit: Payer: Self-pay

## 2021-09-24 ENCOUNTER — Inpatient Hospital Stay: Payer: Medicaid Other

## 2021-09-24 VITALS — BP 128/83 | HR 81 | Temp 97.8°F | Resp 15 | Wt 204.6 lb

## 2021-09-24 DIAGNOSIS — Z8249 Family history of ischemic heart disease and other diseases of the circulatory system: Secondary | ICD-10-CM | POA: Insufficient documentation

## 2021-09-24 DIAGNOSIS — Z8379 Family history of other diseases of the digestive system: Secondary | ICD-10-CM | POA: Insufficient documentation

## 2021-09-24 DIAGNOSIS — I2510 Atherosclerotic heart disease of native coronary artery without angina pectoris: Secondary | ICD-10-CM | POA: Insufficient documentation

## 2021-09-24 DIAGNOSIS — M25552 Pain in left hip: Secondary | ICD-10-CM | POA: Insufficient documentation

## 2021-09-24 DIAGNOSIS — C3491 Malignant neoplasm of unspecified part of right bronchus or lung: Secondary | ICD-10-CM

## 2021-09-24 DIAGNOSIS — R0609 Other forms of dyspnea: Secondary | ICD-10-CM | POA: Diagnosis not present

## 2021-09-24 DIAGNOSIS — C787 Secondary malignant neoplasm of liver and intrahepatic bile duct: Secondary | ICD-10-CM | POA: Diagnosis not present

## 2021-09-24 DIAGNOSIS — Z79899 Other long term (current) drug therapy: Secondary | ICD-10-CM | POA: Diagnosis not present

## 2021-09-24 DIAGNOSIS — E119 Type 2 diabetes mellitus without complications: Secondary | ICD-10-CM | POA: Insufficient documentation

## 2021-09-24 DIAGNOSIS — R160 Hepatomegaly, not elsewhere classified: Secondary | ICD-10-CM | POA: Insufficient documentation

## 2021-09-24 DIAGNOSIS — R5383 Other fatigue: Secondary | ICD-10-CM | POA: Diagnosis not present

## 2021-09-24 DIAGNOSIS — M545 Low back pain, unspecified: Secondary | ICD-10-CM | POA: Insufficient documentation

## 2021-09-24 DIAGNOSIS — R634 Abnormal weight loss: Secondary | ICD-10-CM | POA: Insufficient documentation

## 2021-09-24 DIAGNOSIS — B370 Candidal stomatitis: Secondary | ICD-10-CM | POA: Insufficient documentation

## 2021-09-24 DIAGNOSIS — R590 Localized enlarged lymph nodes: Secondary | ICD-10-CM | POA: Insufficient documentation

## 2021-09-24 DIAGNOSIS — I252 Old myocardial infarction: Secondary | ICD-10-CM | POA: Insufficient documentation

## 2021-09-24 DIAGNOSIS — E785 Hyperlipidemia, unspecified: Secondary | ICD-10-CM | POA: Diagnosis not present

## 2021-09-24 DIAGNOSIS — Z888 Allergy status to other drugs, medicaments and biological substances status: Secondary | ICD-10-CM | POA: Insufficient documentation

## 2021-09-24 DIAGNOSIS — Z800 Family history of malignant neoplasm of digestive organs: Secondary | ICD-10-CM | POA: Insufficient documentation

## 2021-09-24 DIAGNOSIS — M25551 Pain in right hip: Secondary | ICD-10-CM | POA: Insufficient documentation

## 2021-09-24 DIAGNOSIS — C7951 Secondary malignant neoplasm of bone: Secondary | ICD-10-CM | POA: Insufficient documentation

## 2021-09-24 DIAGNOSIS — K219 Gastro-esophageal reflux disease without esophagitis: Secondary | ICD-10-CM | POA: Insufficient documentation

## 2021-09-24 DIAGNOSIS — Z5986 Financial insecurity: Secondary | ICD-10-CM | POA: Insufficient documentation

## 2021-09-24 DIAGNOSIS — M549 Dorsalgia, unspecified: Secondary | ICD-10-CM | POA: Insufficient documentation

## 2021-09-24 DIAGNOSIS — Z5112 Encounter for antineoplastic immunotherapy: Secondary | ICD-10-CM | POA: Insufficient documentation

## 2021-09-24 DIAGNOSIS — Z7951 Long term (current) use of inhaled steroids: Secondary | ICD-10-CM | POA: Diagnosis not present

## 2021-09-24 DIAGNOSIS — Z8774 Personal history of (corrected) congenital malformations of heart and circulatory system: Secondary | ICD-10-CM | POA: Insufficient documentation

## 2021-09-24 DIAGNOSIS — Z803 Family history of malignant neoplasm of breast: Secondary | ICD-10-CM | POA: Insufficient documentation

## 2021-09-24 DIAGNOSIS — Z833 Family history of diabetes mellitus: Secondary | ICD-10-CM | POA: Insufficient documentation

## 2021-09-24 DIAGNOSIS — Z807 Family history of other malignant neoplasms of lymphoid, hematopoietic and related tissues: Secondary | ICD-10-CM | POA: Insufficient documentation

## 2021-09-24 DIAGNOSIS — Z880 Allergy status to penicillin: Secondary | ICD-10-CM | POA: Insufficient documentation

## 2021-09-24 DIAGNOSIS — I10 Essential (primary) hypertension: Secondary | ICD-10-CM | POA: Diagnosis not present

## 2021-09-24 DIAGNOSIS — Z5111 Encounter for antineoplastic chemotherapy: Secondary | ICD-10-CM | POA: Diagnosis present

## 2021-09-24 DIAGNOSIS — C3411 Malignant neoplasm of upper lobe, right bronchus or lung: Secondary | ICD-10-CM | POA: Insufficient documentation

## 2021-09-24 LAB — CBC WITH DIFFERENTIAL (CANCER CENTER ONLY)
Abs Immature Granulocytes: 0.05 10*3/uL (ref 0.00–0.07)
Basophils Absolute: 0 10*3/uL (ref 0.0–0.1)
Basophils Relative: 1 %
Eosinophils Absolute: 0.1 10*3/uL (ref 0.0–0.5)
Eosinophils Relative: 2 %
HCT: 35.5 % — ABNORMAL LOW (ref 36.0–46.0)
Hemoglobin: 12 g/dL (ref 12.0–15.0)
Immature Granulocytes: 1 %
Lymphocytes Relative: 25 %
Lymphs Abs: 1.9 10*3/uL (ref 0.7–4.0)
MCH: 29 pg (ref 26.0–34.0)
MCHC: 33.8 g/dL (ref 30.0–36.0)
MCV: 85.7 fL (ref 80.0–100.0)
Monocytes Absolute: 0.6 10*3/uL (ref 0.1–1.0)
Monocytes Relative: 8 %
Neutro Abs: 4.8 10*3/uL (ref 1.7–7.7)
Neutrophils Relative %: 63 %
Platelet Count: 404 10*3/uL — ABNORMAL HIGH (ref 150–400)
RBC: 4.14 MIL/uL (ref 3.87–5.11)
RDW: 14.4 % (ref 11.5–15.5)
WBC Count: 7.5 10*3/uL (ref 4.0–10.5)
nRBC: 0 % (ref 0.0–0.2)

## 2021-09-24 LAB — CMP (CANCER CENTER ONLY)
ALT: 23 U/L (ref 0–44)
AST: 20 U/L (ref 15–41)
Albumin: 3.9 g/dL (ref 3.5–5.0)
Alkaline Phosphatase: 99 U/L (ref 38–126)
Anion gap: 9 (ref 5–15)
BUN: 11 mg/dL (ref 6–20)
CO2: 22 mmol/L (ref 22–32)
Calcium: 9.1 mg/dL (ref 8.9–10.3)
Chloride: 106 mmol/L (ref 98–111)
Creatinine: 0.68 mg/dL (ref 0.44–1.00)
GFR, Estimated: >60 mL/min (ref >60)
Glucose, Bld: 275 mg/dL — ABNORMAL HIGH (ref 70–99)
Potassium: 4.1 mmol/L (ref 3.5–5.1)
Sodium: 137 mmol/L (ref 135–145)
Total Bilirubin: 0.7 mg/dL (ref 0.3–1.2)
Total Protein: 7.2 g/dL (ref 6.5–8.1)

## 2021-09-24 MED ORDER — SODIUM CHLORIDE 0.9 % IV SOLN
10.0000 mg | Freq: Once | INTRAVENOUS | Status: AC
  Administered 2021-09-24: 10 mg via INTRAVENOUS
  Filled 2021-09-24: qty 10

## 2021-09-24 MED ORDER — SODIUM CHLORIDE 0.9 % IV SOLN
500.0000 mg/m2 | Freq: Once | INTRAVENOUS | Status: AC
  Administered 2021-09-24: 1100 mg via INTRAVENOUS
  Filled 2021-09-24: qty 40

## 2021-09-24 MED ORDER — PALONOSETRON HCL INJECTION 0.25 MG/5ML
0.2500 mg | Freq: Once | INTRAVENOUS | Status: AC
  Administered 2021-09-24: 0.25 mg via INTRAVENOUS
  Filled 2021-09-24: qty 5

## 2021-09-24 MED ORDER — SODIUM CHLORIDE 0.9 % IV SOLN
200.0000 mg | Freq: Once | INTRAVENOUS | Status: AC
  Administered 2021-09-24: 200 mg via INTRAVENOUS
  Filled 2021-09-24: qty 200

## 2021-09-24 MED ORDER — DIPHENHYDRAMINE HCL 50 MG/ML IJ SOLN
50.0000 mg | Freq: Once | INTRAMUSCULAR | Status: AC
  Administered 2021-09-24: 50 mg via INTRAVENOUS
  Filled 2021-09-24: qty 1

## 2021-09-24 MED ORDER — SODIUM CHLORIDE 0.9 % IV SOLN
697.5000 mg | Freq: Once | INTRAVENOUS | Status: AC
  Administered 2021-09-24: 700 mg via INTRAVENOUS
  Filled 2021-09-24: qty 70

## 2021-09-24 MED ORDER — SODIUM CHLORIDE 0.9 % IV SOLN
150.0000 mg | Freq: Once | INTRAVENOUS | Status: AC
  Administered 2021-09-24: 150 mg via INTRAVENOUS
  Filled 2021-09-24: qty 150

## 2021-09-24 MED ORDER — SODIUM CHLORIDE 0.9 % IV SOLN: Freq: Once | INTRAVENOUS | Status: AC

## 2021-09-24 NOTE — Patient Instructions (Signed)
[facility]  Discharge Instructions: Thank you for choosing [facility] to provide your oncology and hematology care.   If you have a lab appointment with the [facility], please go directly to the [facility] and check in at the registration area.   Wear comfortable clothing and clothing appropriate for easy access to any Portacath or PICC line.   We strive to give you quality time with your provider. You may need to reschedule your appointment if you arrive late (15 or more minutes).  Arriving late affects you and other patients whose appointments are after yours.  Also, if you miss three or more appointments without notifying the office, you may be dismissed from the clinic at the provider's discretion.      For prescription refill requests, have your pharmacy contact our office and allow 72 hours for refills to be completed.    Today you received the following chemotherapy and/or immunotherapy agents: Keytruda, Alimta, Carboplatin.       To help prevent nausea and vomiting after your treatment, we encourage you to take your nausea medication as directed.  BELOW ARE SYMPTOMS THAT SHOULD BE REPORTED IMMEDIATELY: *FEVER GREATER THAN 100.4 F (38 C) OR HIGHER *CHILLS OR SWEATING *NAUSEA AND VOMITING THAT IS NOT CONTROLLED WITH YOUR NAUSEA MEDICATION *UNUSUAL SHORTNESS OF BREATH *UNUSUAL BRUISING OR BLEEDING *URINARY PROBLEMS (pain or burning when urinating, or frequent urination) *BOWEL PROBLEMS (unusual diarrhea, constipation, pain near the anus) TENDERNESS IN MOUTH AND THROAT WITH OR WITHOUT PRESENCE OF ULCERS (sore throat, sores in mouth, or a toothache) UNUSUAL RASH, SWELLING OR PAIN  UNUSUAL VAGINAL DISCHARGE OR ITCHING   Items with * indicate a potential emergency and should be followed up as soon as possible or go to the Emergency Department if any problems should occur.  Please show the CHEMOTHERAPY ALERT CARD or IMMUNOTHERAPY  ALERT CARD at check-in to the Emergency Department and triage nurse.  Should you have questions after your visit or need to cancel or reschedule your appointment, please contact [facility]  Dept: [phone]  and follow the prompts.  Office hours are 8:00 a.m. to 4:30 p.m. Monday - Friday. Please note that voicemails left after 4:00 p.m. may not be returned until the following business day.  We are closed weekends and major holidays. You have access to a nurse at all times for urgent questions. Please call the main number to the clinic Dept: [phone] and follow the prompts.   For any non-urgent questions, you may also contact your provider using MyChart. We now offer e-Visits for anyone 59 and older to request care online for non-urgent symptoms. For details visit [email or web address].   Also download the MyChart app! Go to the app store, search "MyChart", open the app, select [facility], and log in with your MyChart username and password.  Masks are optional in the cancer centers. If you would like for your care team to wear a mask while they are taking care of you, please let them know. You may have one support person who is at least 58 years old accompany you for your appointments. Pembrolizumab Injection What is this medication? PEMBROLIZUMAB (PEM broe LIZ ue mab) treats some types of cancer. It works by helping your immune system slow or stop the spread of cancer cells. It is a monoclonal antibody. This medicine may be used for other purposes; ask your health care provider or pharmacist if you have questions. COMMON BRAND NAME(S):  Keytruda What should I tell my care team before I take this medication? They need to know if you have any of these conditions: Allogeneic stem cell transplant (uses someone else's stem cells) Autoimmune diseases, such as Crohn disease, ulcerative colitis, lupus History of chest radiation Nervous system problems, such as  Guillain-Barre syndrome, myasthenia gravis Organ transplant An unusual or allergic reaction to pembrolizumab, other medications, foods, dyes, or preservatives Pregnant or trying to get pregnant Breast-feeding How should I use this medication? This medication is injected into a vein. It is given by your care team in a hospital or clinic setting. A special MedGuide will be given to you before each treatment. Be sure to read this information carefully each time. Talk to your care team about the use of this medication in children. While it may be prescribed for children as young as 6 months for selected conditions, precautions do apply. Overdosage: If you think you have taken too much of this medicine contact a poison control center or emergency room at once. NOTE: This medicine is only for you. Do not share this medicine with others. What if I miss a dose? Keep appointments for follow-up doses. It is important not to miss your dose. Call your care team if you are unable to keep an appointment. What may interact with this medication? Interactions have not been studied. This list may not describe all possible interactions. Give your health care provider a list of all the medicines, herbs, non-prescription drugs, or dietary supplements you use. Also tell them if you smoke, drink alcohol, or use illegal drugs. Some items may interact with your medicine. What should I watch for while using this medication? Your condition will be monitored carefully while you are receiving this medication. You may need blood work while taking this medication. This medication may cause serious skin reactions. They can happen weeks to months after starting the medication. Contact your care team right away if you notice fevers or flu-like symptoms with a rash. The rash may be red or purple and then turn into blisters or peeling of the skin. You may also notice a red rash with swelling of the face, lips, or lymph nodes in your  neck or under your arms. Tell your care team right away if you have any change in your eyesight. Talk to your care team if you may be pregnant. Serious birth defects can occur if you take this medication during pregnancy and for 4 months after the last dose. You will need a negative pregnancy test before starting this medication. Contraception is recommended while taking this medication and for 4 months after the last dose. Your care team can help you find the option that works for you. Do not breastfeed while taking this medication and for 4 months after the last dose. What side effects may I notice from receiving this medication? Side effects that you should report to your care team as soon as possible: Allergic reactions--skin rash, itching, hives, swelling of the face, lips, tongue, or throat Dry cough, shortness of breath or trouble breathing Eye pain, redness, irritation, or discharge with blurry or decreased vision Heart muscle inflammation--unusual weakness or fatigue, shortness of breath, chest pain, fast or irregular heartbeat, dizziness, swelling of the ankles, feet, or hands Hormone gland problems--headache, sensitivity to light, unusual weakness or fatigue, dizziness, fast or irregular heartbeat, increased sensitivity to cold or heat, excessive sweating, constipation, hair loss, increased thirst or amount of urine, tremors or shaking, irritability Infusion reactions--chest pain, shortness  of breath or trouble breathing, feeling faint or lightheaded Kidney injury (glomerulonephritis)--decrease in the amount of urine, red or dark brown urine, foamy or bubbly urine, swelling of the ankles, hands, or feet Liver injury--right upper belly pain, loss of appetite, nausea, light-colored stool, dark yellow or brown urine, yellowing skin or eyes, unusual weakness or fatigue Pain, tingling, or numbness in the hands or feet, muscle weakness, change in vision, confusion or trouble speaking, loss of  balance or coordination, trouble walking, seizures Rash, fever, and swollen lymph nodes Redness, blistering, peeling, or loosening of the skin, including inside the mouth Sudden or severe stomach pain, bloody diarrhea, fever, nausea, vomiting Side effects that usually do not require medical attention (report to your care team if they continue or are bothersome): Bone, joint, or muscle pain Diarrhea Fatigue Loss of appetite Nausea Skin rash This list may not describe all possible side effects. Call your doctor for medical advice about side effects. You may report side effects to FDA at 1-800-FDA-1088. Where should I keep my medication? This medication is given in a hospital or clinic. It will not be stored at home. NOTE: This sheet is a summary. It may not cover all possible information. If you have questions about this medicine, talk to your doctor, pharmacist, or health care provider.  2023 Elsevier/Gold Standard (2021-04-20 00:00:00) Pemetrexed Injection What is this medication? PEMETREXED (PEM e TREX ed) treats some types of cancer. It works by slowing down the growth of cancer cells. This medicine may be used for other purposes; ask your health care provider or pharmacist if you have questions. COMMON BRAND NAME(S): Alimta, PEMFEXY What should I tell my care team before I take this medication? They need to know if you have any of these conditions: Infection, such as chickenpox, cold sores, or herpes Kidney disease Low blood cell levels (white cells, red cells, and platelets) Lung or breathing disease, such as asthma Radiation therapy An unusual or allergic reaction to pemetrexed, other medications, foods, dyes, or preservatives If you or your partner are pregnant or trying to get pregnant Breast-feeding How should I use this medication? This medication is injected into a vein. It is given by your care team in a hospital or clinic setting. Talk to your care team about the use of  this medication in children. Special care may be needed. Overdosage: If you think you have taken too much of this medicine contact a poison control center or emergency room at once. NOTE: This medicine is only for you. Do not share this medicine with others. What if I miss a dose? Keep appointments for follow-up doses. It is important not to miss your dose. Call your care team if you are unable to keep an appointment. What may interact with this medication? Do not take this medication with any of the following: Live virus vaccines This medication may also interact with the following: Ibuprofen This list may not describe all possible interactions. Give your health care provider a list of all the medicines, herbs, non-prescription drugs, or dietary supplements you use. Also tell them if you smoke, drink alcohol, or use illegal drugs. Some items may interact with your medicine. What should I watch for while using this medication? Your condition will be monitored carefully while you are receiving this medication. This medication may make you feel generally unwell. This is not uncommon as chemotherapy can affect healthy cells as well as cancer cells. Report any side effects. Continue your course of treatment even though you feel  ill unless your care team tells you to stop. This medication can cause serious side effects. To reduce the risk, your care team may give you other medications to take before receiving this one. Be sure to follow the directions from your care team. This medication can cause a rash or redness in areas of the body that have previously had radiation therapy. If you have had radiation therapy, tell your care team if you notice a rash in this area. This medication may increase your risk of getting an infection. Call your care team for advice if you get a fever, chills, sore throat, or other symptoms of a cold or flu. Do not treat yourself. Try to avoid being around people who are  sick. Be careful brushing or flossing your teeth or using a toothpick because you may get an infection or bleed more easily. If you have any dental work done, tell your dentist you are receiving this medication. Avoid taking medications that contain aspirin, acetaminophen, ibuprofen, naproxen, or ketoprofen unless instructed by your care team. These medications may hide a fever. Check with your care team if you have severe diarrhea, nausea, and vomiting, or if you sweat a lot. The loss of too much body fluid may make it dangerous for you to take this medication. Talk to your care team if you or your partner wish to become pregnant or think either of you might be pregnant. This medication can cause serious birth defects if taken during pregnancy and for 6 months after the last dose. A negative pregnancy test is required before starting this medication. A reliable form of contraception is recommended while taking this medication and for 6 months after the last dose. Talk to your care team about reliable forms of contraception. Do not father a child while taking this medication and for 3 months after the last dose. Use a condom while having sex during this time period. Do not breastfeed while taking this medication and for 1 week after the last dose. This medication may cause infertility. Talk to your care team if you are concerned about your fertility. What side effects may I notice from receiving this medication? Side effects that you should report to your care team as soon as possible: Allergic reactions--skin rash, itching, hives, swelling of the face, lips, tongue, or throat Dry cough, shortness of breath or trouble breathing Infection--fever, chills, cough, sore throat, wounds that don't heal, pain or trouble when passing urine, general feeling of discomfort or being unwell Kidney injury--decrease in the amount of urine, swelling of the ankles, hands, or feet Low red blood cell level--unusual  weakness or fatigue, dizziness, headache, trouble breathing Redness, blistering, peeling, or loosening of the skin, including inside the mouth Unusual bruising or bleeding Side effects that usually do not require medical attention (report to your care team if they continue or are bothersome): Fatigue Loss of appetite Nausea Vomiting This list may not describe all possible side effects. Call your doctor for medical advice about side effects. You may report side effects to FDA at 1-800-FDA-1088. Where should I keep my medication? This medication is given in a hospital or clinic. It will not be stored at home. NOTE: This sheet is a summary. It may not cover all possible information. If you have questions about this medicine, talk to your doctor, pharmacist, or health care provider.  2023 Elsevier/Gold Standard (2021-05-04 00:00:00) Carboplatin Injection What is this medication? CARBOPLATIN (KAR boe pla tin) treats some types of cancer. It works by  slowing down the growth of cancer cells. This medicine may be used for other purposes; ask your health care provider or pharmacist if you have questions. COMMON BRAND NAME(S): Paraplatin What should I tell my care team before I take this medication? They need to know if you have any of these conditions: Blood disorders Hearing problems Kidney disease Recent or ongoing radiation therapy An unusual or allergic reaction to carboplatin, cisplatin, other medications, foods, dyes, or preservatives Pregnant or trying to get pregnant Breast-feeding How should I use this medication? This medication is injected into a vein. It is given by your care team in a hospital or clinic setting. Talk to your care team about the use of this medication in children. Special care may be needed. Overdosage: If you think you have taken too much of this medicine contact a poison control center or emergency room at once. NOTE: This medicine is only for you. Do not share  this medicine with others. What if I miss a dose? Keep appointments for follow-up doses. It is important not to miss your dose. Call your care team if you are unable to keep an appointment. What may interact with this medication? Medications for seizures Some antibiotics, such as amikacin, gentamicin, neomycin, streptomycin, tobramycin Vaccines This list may not describe all possible interactions. Give your health care provider a list of all the medicines, herbs, non-prescription drugs, or dietary supplements you use. Also tell them if you smoke, drink alcohol, or use illegal drugs. Some items may interact with your medicine. What should I watch for while using this medication? Your condition will be monitored carefully while you are receiving this medication. You may need blood work while taking this medication. This medication may make you feel generally unwell. This is not uncommon, as chemotherapy can affect healthy cells as well as cancer cells. Report any side effects. Continue your course of treatment even though you feel ill unless your care team tells you to stop. In some cases, you may be given additional medications to help with side effects. Follow all directions for their use. This medication may increase your risk of getting an infection. Call your care team for advice if you get a fever, chills, sore throat, or other symptoms of a cold or flu. Do not treat yourself. Try to avoid being around people who are sick. Avoid taking medications that contain aspirin, acetaminophen, ibuprofen, naproxen, or ketoprofen unless instructed by your care team. These medications may hide a fever. Be careful brushing or flossing your teeth or using a toothpick because you may get an infection or bleed more easily. If you have any dental work done, tell your dentist you are receiving this medication. Talk to your care team if you wish to become pregnant or think you might be pregnant. This medication can  cause serious birth defects. Talk to your care team about effective forms of contraception. Do not breast-feed while taking this medication. What side effects may I notice from receiving this medication? Side effects that you should report to your care team as soon as possible: Allergic reactions--skin rash, itching, hives, swelling of the face, lips, tongue, or throat Infection--fever, chills, cough, sore throat, wounds that don't heal, pain or trouble when passing urine, general feeling of discomfort or being unwell Low red blood cell level--unusual weakness or fatigue, dizziness, headache, trouble breathing Pain, tingling, or numbness in the hands or feet, muscle weakness, change in vision, confusion or trouble speaking, loss of balance or coordination, trouble walking, seizures  Unusual bruising or bleeding Side effects that usually do not require medical attention (report to your care team if they continue or are bothersome): Hair loss Nausea Unusual weakness or fatigue Vomiting This list may not describe all possible side effects. Call your doctor for medical advice about side effects. You may report side effects to FDA at 1-800-FDA-1088. Where should I keep my medication? This medication is given in a hospital or clinic. It will not be stored at home. NOTE: This sheet is a summary. It may not cover all possible information. If you have questions about this medicine, talk to your doctor, pharmacist, or health care provider.  2023 Elsevier/Gold Standard (2021-04-21 00:00:00)

## 2021-09-24 NOTE — Progress Notes (Signed)
[facility] Spiritual Care Note  Received page from nursing re Ms Hilgeman's request for someone to talk to. Visited with Ms Warsame, who goes by "Angie," prior to her seeing Dr Julien Nordmann. She was tearful with overwhelm at her recent medical history, from open heart surgery to learning of her lung cancer diagnosis in the ED, and now beginning treatment that contributes additional pain to the bone pain she's already experiencing. Further, she notes that her mother died after complications from a biopsy, which understandably causes her 58yo son additional worry about Angie's situation. Angie also notes that her faith and spirituality have grown significantly in the last year, with focus on prayer, yoga, chakras, and other tools.  Provided pastoral presence, empathic listening, and spiritual/emotional support. We plan to follow up by phone, as the visit time was limited today.   [place], [place], [facility] Pager [phone] Voicemail [phone]

## 2021-09-24 NOTE — Progress Notes (Signed)
[place] Telephone:([phone]) [phone]   Fax:([phone]) [place], MD [place] [address]  DIAGNOSIS: stage IV (T2a, N2, M1c) non-small cell lung cancer, adenocarcinoma presented with right upper lobe lung mass in addition to right hilar and mediastinal lymphadenopathy and innumerable bilateral pulmonary nodules as well as liver and bone metastasis diagnosed in August 2023.   Detected Alteration(s) / Biomarker(s) Associated FDA-approved therapies Clinical Trial Availability % cfDNA or Amplification  KRAS G12C  approved by FDA Adagrasib, Sotorasib Yes 5.3%  IDH1 R132L  approved in other indication Ivosidenib, Olutasidenib Yes 1.9%  PD-L1 expression 89%  PRIOR THERAPY: None  CURRENT THERAPY: Systemic chemotherapy with carboplatin for AUC of 5, Alimta 500 Mg/M2 and Keytruda 200 Mg IV every 3 weeks.  First dose September 02, 2021.  Status post 1 cycle.   INTERVAL HISTORY: Cassidy [name] 56 y.o. female returns to the clinic today for follow-up visit.  The patient continues to complain of pain especially in the lower back with radiation to the hips bilaterally.  She was given prescription of Norco by her primary care physician but it does not help much.  I offered her switch to oxycodone but she is worried about denial by insurance.  She denied having any chest pain but continues to have shortness of breath at baseline increased with exertion.  She has no nausea, vomiting, diarrhea or constipation.  She has no headache or visual changes.  She is finally approved for MRI of the brain by her insurance.  She tolerated the first cycle of her chemotherapy fairly well.  She is here today for evaluation before starting cycle #2.   MEDICAL HISTORY: Past Medical History:  Diagnosis Date   Adenocarcinoma of right lung, stage 4 ([facility]) 08/26/2021   Anemia    has sickle cell trait   Atherosclerotic heart disease of native  coronary artery with angina pectoris ([place]) 05/2012, 10/2013   a.  s/p PCTA to dRCA and ostial RPAV-PLA vessel + PDA branch (05/2012)  b. [place] s/p DES to mLAD - Resolute DES 3.0 x 22 (3.41m -->3.3 mm)   Bipolar depression ([facility]    Breast abscess    a. right side.    COPD (chronic obstructive pulmonary disease) (HCC)    Depression    Diabetic peripheral neuropathy (HCC)    Fibromyalgia    Genital warts    GERD (gastroesophageal reflux disease)    History of hiatal hernia    HLD (hyperlipidemia)    Hypertension    Pain in limb    a. LE VENOUS DUPLEX, 02/05/2009 - no evidence of deep vein thrombosis, Baker's cyst   Pneumonia    PTSD (post-traumatic stress disorder)    Sickle cell trait (HCC)    Tobacco abuse    Tooth caries    Type II diabetes mellitus (HCC)     ALLERGIES:  is allergic to amoxil [amoxicillin], chantix [varenicline], suboxone [buprenorphine hcl-naloxone hcl], and emend [fosaprepitant dimeglumine].  MEDICATIONS:  Current Outpatient Medications  Medication Sig Dispense Refill   ACCU-CHEK AVIVA PLUS test strip TEST BLOOD SUGAR THREE TIMES DAILY AS DIRECTED 50 each 1   amLODipine (NORVASC) 5 MG tablet Take 1 tablet (5 mg total) by mouth at bedtime. 90 tablet 3   aspirin EC 81 MG tablet Take 81 mg by mouth daily.     atorvastatin (LIPITOR) 40 MG tablet TAKE 1 TABLET (40 MG TOTAL) BY MOUTH DAILY. 90 tablet  3   busPIRone (BUSPAR) 15 MG tablet TAKE 1/2 TABLET (7.5 MG TOTAL) BY MOUTH TWICE DAILY (Patient taking differently: Take 7.5 mg by mouth 2 (two) times daily.) 30 tablet 11   Carboxymethylcellul-Glycerin (LUBRICATING EYE DROPS OP) Place 1 drop into both eyes daily as needed (dry eyes).     carvedilol (COREG) 12.5 MG tablet Take 1 tablet (12.5 mg total) by mouth 2 (two) times daily with a meal. 180 tablet 1   diclofenac Sodium (VOLTAREN) 1 % GEL Apply 1 application topically 3 times daily as needed for pain 2 g 3   diphenhydramine-acetaminophen (TYLENOL PM) 25-500 MG TABS  tablet Take 1 tablet by mouth at bedtime as needed (sleep). (Patient not taking: Reported on 09/07/2021)     Dulaglutide (TRULICITY) 1.61 WR/6.0AV SOPN Inject 0.75 mg into the skin once a week. (Patient taking differently: Inject 0.75 mg into the skin every Monday.) 2 mL 0   ferrous sulfate 325 (65 FE) MG tablet Take 325 mg by mouth daily with breakfast.     fluticasone (FLONASE) 50 MCG/ACT nasal spray PLACE 1 SPRAY INTO BOTH NOSTRILS DAILY AS NEEDED (CONGESTION). 16 g 1   fluticasone-salmeterol (ADVAIR) 100-50 MCG/ACT AEPB Inhale 1 puff into the lungs 2 (two) times daily. 1 each 3   folic acid (FOLVITE) 1 MG tablet Take 1 tablet (1 mg total) by mouth daily. 30 tablet 3   furosemide (LASIX) 40 MG tablet Take 1 tablet (40 mg total) by mouth daily as needed. If you gain 3 lbs in 24 hours or 5 lbs in 1 week (Patient not taking: Reported on 09/07/2021) 30 tablet 1   HYDROcodone-acetaminophen (NORCO/VICODIN) 5-325 MG tablet Take 1 tablet by mouth every 8 (eight) hours. 90 tablet 0   ipratropium (ATROVENT HFA) 17 MCG/ACT inhaler Inhale 2 puffs into the lungs every 4 (four) hours as needed for wheezing (COPD).     isosorbide mononitrate (IMDUR) 30 MG 24 hr tablet Take 30 mg by mouth daily. (Patient not taking: Reported on 08/31/2021)     LINZESS 290 MCG CAPS capsule TAKE 1 CAPSULE (290 MCG TOTAL) BY MOUTH DAILY BEFORE BREAKFAST. (Patient not taking: Reported on 09/07/2021) 30 capsule 3   losartan (COZAAR) 50 MG tablet Take 50 mg by mouth daily. (Patient not taking: Reported on 09/07/2021)     methocarbamol (ROBAXIN) 500 MG tablet Take 1 tablet (500 mg total) by mouth every 8 (eight) hours as needed for muscle spasms. (Patient not taking: Reported on 09/07/2021) 20 tablet 0   nicotine polacrilex (NICORETTE) 2 MG gum Take 1 each (2 mg total) by mouth as needed for smoking cessation. 100 tablet 0   nitroGLYCERIN (NITROSTAT) 0.4 MG SL tablet Place 1 tablet (0.4 mg total) under the tongue every 5 (five) minutes as  needed for chest pain. 25 tablet 5   oxyCODONE-acetaminophen (PERCOCET) 5-325 MG tablet Take 1-2 tablets by mouth every 6 (six) hours as needed for severe pain. (Patient not taking: Reported on 09/07/2021) 45 tablet 0   pantoprazole (PROTONIX) 40 MG tablet TAKE 1 TABLET (40 MG TOTAL) BY MOUTH DAILY. 30 tablet 1   potassium chloride SA (KLOR-CON M) 20 MEQ tablet Take 1 tablet (20 mEq total) by mouth daily as needed. On days you take Lasix (Patient not taking: Reported on 09/07/2021) 30 tablet 1   prochlorperazine (COMPAZINE) 10 MG tablet Take 1 tablet (10 mg total) by mouth every 6 (six) hours as needed for nausea or vomiting. 30 tablet 0   sertraline (ZOLOFT) 50 MG  tablet TAKE 1 TABLET (50 MG TOTAL) BY MOUTH DAILY. 30 tablet 3   valACYclovir (VALTREX) 1000 MG tablet TAKE FOR 3 DAYS AS NEEDED FOR EACH OUTBREAK 30 tablet 11   vitamin B-12 (CYANOCOBALAMIN) 1000 MCG tablet Take 1,000 mcg by mouth daily.     No current facility-administered medications for this visit.    SURGICAL HISTORY:  Past Surgical History:  Procedure Laterality Date   BLADDER SURGERY  1980   "TVT"   BLADDER SURGERY  2003   "TVT"   BREAST EXCISIONAL BIOPSY     BREAST SURGERY Right    I&D for multiple abscesses   CARDIAC CATHETERIZATION     CORONARY ARTERY BYPASS GRAFT N/A 06/30/2021   Procedure: CORONARY ARTERY BYPASS GRAFTING (CABG) x3 USING LEFT INTERNAL MAMMARY ARTERY,  RIGHT RADIAL ARTERY AND LEFT GREATER SAPHENOUS VEIN;  Surgeon: Lajuana Matte, MD;  Location: [facility];  Service: Open Heart Surgery;  Laterality: N/A;   cutting balloon     ENDOVEIN HARVEST OF GREATER SAPHENOUS VEIN Left 06/30/2021   Procedure: ENDOVEIN HARVEST OF GREATER SAPHENOUS VEIN;  Surgeon: Lajuana Matte, MD;  Location: [facility];  Service: Open Heart Surgery;  Laterality: Left;   EYE SURGERY Left    laser surgery   FINE NEEDLE ASPIRATION  08/21/2021   Procedure: FINE NEEDLE ASPIRATION (FNA) LINEAR;  Surgeon: Garner Nash, DO;  Location:  [facility];  Service: Pulmonary;;   INCISE AND DRAIN ABCESS  ; 04/26/11; 08/13/11   right breast   INCISION AND DRAINAGE ABSCESS Right 05/09/2012   Procedure: INCISION AND DRAINAGE RIGHT BREAST ABSCESS;  Surgeon: Imogene Burn. Georgette Dover, MD;  Location: [facility];  Service: General;  Laterality: Right;   INCISION AND DRAINAGE ABSCESS Right 02/08/2014   Procedure: INCISION AND DRAINAGE RIGHT BREAST ABSCESS;  Surgeon: Donnie Mesa, MD;  Location: [facility];  Service: General;  Laterality: Right;   INCISION AND DRAINAGE ABSCESS Right 06/14/2014   Procedure: INCISION AND DRAINAGE RIGHT BREAST ABSCESS;  Surgeon: Donnie Mesa, MD;  Location: [facility];  Service: General;  Laterality: Right;   IRRIGATION AND DEBRIDEMENT ABSCESS  12/19/2010   Procedure: IRRIGATION AND DEBRIDEMENT ABSCESS;  Surgeon: Rolm Bookbinder, MD;  Location: [facility];  Service: General;  Laterality: Right;   IRRIGATION AND DEBRIDEMENT ABSCESS  08/13/2011   Procedure: MINOR INCISION AND DRAINAGE OF ABSCESS;  Surgeon: Gwenyth Ober, MD;  Location: [facility];  Service: General;  Laterality: Right;  Right Breast    IRRIGATION AND DEBRIDEMENT ABSCESS  11/17/2011   Procedure: IRRIGATION AND DEBRIDEMENT ABSCESS;  Surgeon: Imogene Burn. Georgette Dover, MD;  Location: [facility];  Service: General;  Laterality: Right;  irrigation and debridement right recurrent breast abscess   LARYNX SURGERY     LEFT HEART CATH AND CORONARY ANGIOGRAPHY N/A 04/09/2021   Procedure: LEFT HEART CATH AND CORONARY ANGIOGRAPHY;  Surgeon: Jettie Booze, MD;  Location: [facility];  Service: Cardiovascular;  Laterality: N/A;   LEFT HEART CATHETERIZATION WITH CORONARY ANGIOGRAM N/A 05/29/2012   Procedure: LEFT HEART CATHETERIZATION WITH CORONARY ANGIOGRAM & PTCA;  Surgeon: Troy Sine, MD;  Location[facility];  Service: Cardiovascular; Bifurcation dRCA-RPAD/PLA & rPDA  PTCA.   LEFT HEART CATHETERIZATION WITH CORONARY ANGIOGRAM N/A 11/08/2013   Procedure: LEFT HEART CATHETERIZATION WITH  CORONARY ANGIOGRAM and Coronary Stent Intervention;  Surgeon: Leonie Man, MD;  Location: [facility] CATH LAB;  Service: Cardiovascular; mLAD 80% (FFR 0.73) - resolute DES 3.0 x 22 mm (postdilated to 3.3 mm->3.5 mm)  NM MYOVIEW LTD  01/26/2009   Normal study, no evidence of ischemia, EF 67%   ployp removed from voice box 03/30/12     RADIAL ARTERY HARVEST Right 06/30/2021   Procedure: RADIAL ARTERY HARVEST;  Surgeon: Lajuana Matte, MD;  Location: [facility];  Service: Open Heart Surgery;  Laterality: Right;   REFRACTIVE SURGERY  ~ 2010   right   TEE WITHOUT CARDIOVERSION N/A 06/30/2021   Procedure: TRANSESOPHAGEAL ECHOCARDIOGRAM (TEE);  Surgeon: Lajuana Matte, MD;  Location: [facility];  Service: Open Heart Surgery;  Laterality: N/A;   TONSILLECTOMY  1988   VIDEO BRONCHOSCOPY WITH ENDOBRONCHIAL ULTRASOUND Bilateral 08/21/2021   Procedure: VIDEO BRONCHOSCOPY WITH ENDOBRONCHIAL ULTRASOUND;  Surgeon: Garner Nash, DO;  Location: [facility];  Service: Pulmonary;  Laterality: Bilateral;    REVIEW OF SYSTEMS:  Constitutional: positive for fatigue and weight loss Eyes: negative Ears, nose, mouth, throat, and face: negative Respiratory: positive for dyspnea on exertion Cardiovascular: negative Gastrointestinal: negative Genitourinary:negative Integument/breast: negative Hematologic/lymphatic: negative Musculoskeletal:positive for back pain Neurological: negative Behavioral/Psych: negative Endocrine: negative Allergic/Immunologic: negative   PHYSICAL EXAMINATION: General appearance: alert, cooperative, fatigued, and no distress Head: Normocephalic, without obvious abnormality, atraumatic Neck: no adenopathy, no JVD, supple, symmetrical, trachea midline, and thyroid not enlarged, symmetric, no tenderness/mass/nodules Lymph nodes: Cervical, supraclavicular, and axillary nodes normal. Resp: clear to auscultation bilaterally Back: symmetric, no curvature. ROM normal. No CVA  tenderness. Cardio: regular rate and rhythm, S1, S2 normal, no murmur, click, rub or gallop GI: soft, non-tender; bowel sounds normal; no masses,  no organomegaly Extremities: extremities normal, atraumatic, no cyanosis or edema Neurologic: Alert and oriented X 3, normal strength and tone. Normal symmetric reflexes. Normal coordination and gait  ECOG PERFORMANCE STATUS: 1 - Symptomatic but completely ambulatory  Blood pressure 128/83, pulse 81, temperature 97.8 F (36.6 C), temperature source Oral, resp. rate 15, weight 204 lb 9.6 oz (92.8 kg), last menstrual period 06/12/2016, SpO2 99 %.  LABORATORY DATA: Lab Results  Component Value Date   WBC 3.5 (L) 09/09/2021   HGB 12.4 09/09/2021   HCT 35.9 (L) 09/09/2021   MCV 83.9 09/09/2021   PLT 209 09/09/2021      Chemistry      Component Value Date/Time   NA 141 09/09/2021 1504   NA 140 04/03/2021 1059   K 4.0 09/09/2021 1504   CL 109 09/09/2021 1504   CO2 24 09/09/2021 1504   BUN 15 09/09/2021 1504   BUN 13 04/03/2021 1059   CREATININE 0.69 09/09/2021 1504   CREATININE 0.65 11/06/2013 1520      Component Value Date/Time   CALCIUM 9.1 09/09/2021 1504   ALKPHOS 83 09/09/2021 1504   AST 14 (L) 09/09/2021 1504   ALT 14 09/09/2021 1504   BILITOT 0.6 09/09/2021 1504       RADIOGRAPHIC STUDIES: NM PET Image Initial (PI) Skull Base To Thigh (F-18 FDG)  Result Date: 08/31/2021 CLINICAL DATA:  Initial treatment strategy for non-small cell right lung cancer. Adenocarcinoma by 08/21/2021 FNA. EXAM: NUCLEAR MEDICINE PET SKULL BASE TO THIGH TECHNIQUE: 10.1 mCi F-18 FDG was injected intravenously. Full-ring PET imaging was performed from the skull base to thigh after the radiotracer. CT data was obtained and used for attenuation correction and anatomic localization. Fasting blood glucose: 149 mg/dl COMPARISON:  08/18/2021 chest CT angiogram. 05/22/2015 CT abdomen/pelvis. FINDINGS: Mediastinal blood pool activity: SUV max 2.9 Liver  activity: SUV max NA NECK: Enlarged hypermetabolic bilateral supraclavicular neck lymph nodes, largest 1.3 cm on the left with  max SUV 7.5 (series 4/image 44). Incidental CT findings: None. CHEST: Hypermetabolic solid 3.4 x 2.3 cm right upper lobe lung mass with max SUV 9.7 (series 7/image 18). Innumerable (greater than 20) solid pulmonary nodules scattered throughout both lungs involving all lung lobes, several of which demonstrate hypermetabolism, for example measuring 1.2 cm in the right upper lobe with max SUV 4.6 (series 7/image 25) and measuring 1.2 cm in the posterior left upper lobe with max SUV 3.2 (series 7/image 33). Bulky hypermetabolic right hilar lymphadenopathy measuring 3.1 cm short axis diameter with max SUV 9.4 (series 4/image 67). Enlarged hypermetabolic 1.5 cm subcarinal node with max SUV 8.9 (series 4/image 69). Enlarged hypermetabolic 1.3 cm right paratracheal node with max SUV 9.2 (series 4/image 63). Hypermetabolic high left mediastinal prevascular 0.8 cm node with max SUV 5.0 (series 4/image 48). No hypermetabolic left hilar nodes. Incidental CT findings: Coronary atherosclerosis status post CABG. Atherosclerotic nonaneurysmal thoracic aorta. Incompletely healed median sternotomy with intact median sternotomy wires. Hypermetabolism along the median sternotomy site without discrete osseous lesions. ABDOMEN/PELVIS: Hypermetabolic 1.5 cm far inferior right liver mass with max SUV 6.6 (series 4/image 128). Hypermetabolic 1.9 cm segment 3 left liver mass with max SUV 9.3 (series 4/image 104). No abnormal hypermetabolic activity within the pancreas, adrenal glands, or spleen. No hypermetabolic lymph nodes in the abdomen or pelvis. Incidental CT findings: Right adrenal 2.6 cm nodule with density 22 HU, stable since 05/22/2015 CT abdomen, compatible with a benign adenoma. Atherosclerotic nonaneurysmal abdominal aorta. SKELETON: Several scattered hypermetabolic lytic skeletal lesions to the spine,  ribs, scapula, pelvis and right femur. Representative lesions as follows: -proximal right femoral metaphysis lesion with max SUV 6.5 -midline upper sacral lesion with max SUV 10.1 -right L4 vertebral lesion with max SUV 8.8 -lateral left sixth rib lesion with max SUV 7.7 Incidental CT findings: None. IMPRESSION: 1. Hypermetabolic solid 3.4 cm right upper lobe lung mass compatible with primary bronchogenic malignancy. 2. Hypermetabolic metastatic adenopathy to the ipsilateral hilum, bilateral mediastinum and bilateral supraclavicular neck. 3. Two hypermetabolic liver metastases. 4. Several scattered hypermetabolic lytic skeletal metastases to the axial and proximal appendicular skeleton. 5. Chronic findings include: Aortic Atherosclerosis (ICD10-I70.0). Right adrenal nodule stable since 2017 CT, favoring benign adenoma. Electronically Signed   By: Ilona Sorrel M.D.   On: 08/31/2021 16:24    ASSESSMENT AND PLAN: This is a very pleasant 56 years old African-American female recently diagnosed with a stage IV (T2 a, N2, M1 C) non-small cell lung cancer, adenocarcinoma presented with right upper lobe lung mass in addition to right hilar and mediastinal lymphadenopathy as well as innumerable bilateral pulmonary nodules and bone and liver metastasis diagnosed in August 2023 with positive KRAS G12C mutation and PD-L1 expression of 89%. She is currently undergoing systemic chemotherapy with carboplatin for AUC of 5, Alimta 500 Mg/M2 and Keytruda 200 Mg IV every 3 weeks status post 1 cycle.  She tolerated the first cycle of her treatment well except for fatigue. I recommended for her to proceed with cycle #2 today as planned. For the back pain, she is currently on Norco every 8 hours.  She takes her medication at regular basis with no improvement.  I offered switching her Norco to Percocet but the patient just received 90 tablets from her primary care physician and she is worried about insurance denial.  I will refer her  to the palliative care clinic for management of her pain issues. For the bone metastasis, I will refer the patient to radiation oncology for  palliative radiotherapy to the painful bone lesions. The patient will come back for follow-up visit in 3 weeks for evaluation before the next cycle of her treatment. She was advised to call immediately if she has any other concerning symptoms in the interval.  The patient voices understanding of current disease status and treatment options and is in agreement with the current care plan.  All questions were answered. The patient knows to call the clinic with any problems, questions or concerns. We can certainly see the patient much sooner if necessary.  Disclaimer: This note was dictated with voice recognition software. Similar sounding words can inadvertently be transcribed and may not be corrected upon review.

## 2021-09-24 NOTE — Progress Notes (Signed)
Cassidy [name], counseling intern, spoke with patient in infusion. Patient reported they were feeling better, emotionally, than they were feeling this morning. Patient reported they planned on going to the park this evening to meditate.   Counselor will call patient next week to set up a counseling session.  Cassidy [name]  Counseling Intern

## 2021-09-25 ENCOUNTER — Encounter: Payer: Self-pay | Admitting: Licensed Clinical Social Worker

## 2021-09-25 DIAGNOSIS — C3491 Malignant neoplasm of unspecified part of right bronchus or lung: Secondary | ICD-10-CM

## 2021-09-25 NOTE — Progress Notes (Signed)
[facility] [place] CSW Progress Note  Clinical Education officer, museum contacted patient by phone to check in.  Pt reports she has struggled with anxiety and depression chronically and is prescribed medication by her PCP, but is not sure the medication is enough with the added stress of her cancer diagnosis.  Pt met w/ the chaplin and counseling intern at her last treatment which was a positive experience.  CSW to reach out to both to ensure regular contact is made w/ pt.  CSW also provided pt with contact information for psychiatrists she can reach out to in order to establish a mental health provider if her medication needs to be adjusted.  [facility] may also be beneficial as pt states she loves nature.  CSW to have [facility] reach out to pt to discuss.  Pt added to  lung cancer support group and will receive an invite in October.  CSW to continue to follow as appropriate throughout duration of treatment.      [name], LCSW    Patient is participating in a Managed Medicaid Plan:  Yes

## 2021-09-28 ENCOUNTER — Other Ambulatory Visit: Payer: Self-pay

## 2021-09-28 ENCOUNTER — Inpatient Hospital Stay (HOSPITAL_BASED_OUTPATIENT_CLINIC_OR_DEPARTMENT_OTHER): Payer: Medicaid Other | Admitting: Physician Assistant

## 2021-09-28 ENCOUNTER — Ambulatory Visit (HOSPITAL_COMMUNITY)
Admission: RE | Admit: 2021-09-28 | Discharge: 2021-09-28 | Disposition: A | Discharge status: Home / Self Care | Payer: Medicaid Other | Source: Ambulatory Visit | Attending: Internal Medicine | Admitting: Internal Medicine

## 2021-09-28 ENCOUNTER — Telehealth: Payer: Self-pay | Admitting: Medical Oncology

## 2021-09-28 ENCOUNTER — Telehealth: Payer: Self-pay

## 2021-09-28 VITALS — BP 125/79 | HR 87 | Temp 97.9°F | Resp 16 | Ht 67.5 in | Wt 205.8 lb

## 2021-09-28 DIAGNOSIS — M79602 Pain in left arm: Secondary | ICD-10-CM | POA: Diagnosis not present

## 2021-09-28 DIAGNOSIS — C3491 Malignant neoplasm of unspecified part of right bronchus or lung: Secondary | ICD-10-CM

## 2021-09-28 DIAGNOSIS — B370 Candidal stomatitis: Secondary | ICD-10-CM

## 2021-09-28 DIAGNOSIS — C3490 Malignant neoplasm of unspecified part of unspecified bronchus or lung: Secondary | ICD-10-CM | POA: Insufficient documentation

## 2021-09-28 DIAGNOSIS — G9389 Other specified disorders of brain: Secondary | ICD-10-CM | POA: Diagnosis not present

## 2021-09-28 MED ORDER — GADOBUTROL 1 MMOL/ML IV SOLN
9.0000 mL | Freq: Once | INTRAVENOUS | Status: AC | PRN
  Administered 2021-09-28: 9 mL via INTRAVENOUS

## 2021-09-28 MED ORDER — NYSTATIN 100000 UNIT/ML MT SUSP: 5.0000 mL | Freq: Four times a day (QID) | OROMUCOSAL | 0 refills | Status: DC

## 2021-09-28 NOTE — Progress Notes (Signed)
Histology and Location of Primary Cancer: Right Lung-Upper lobe, right hilar, and mediastinal lymphadenopathy, innumerable bilateral pulmonary nodules, liver and bone metastasis.  Patient continues to complain of pain in her lower back with radiation to the hips bilaterally.  Location(s) of Symptomatic Metastases: L4  MRI Brain 09/28/2021:   PET 08/28/2021: 3.4 cm right upper lobe mass, hypermetabolic adenopathy in the chest, and bilateral supraclavicular neck.  2 lesions in the liver were hypermetabolic, and the lesions in her skeleton involved the spine, ribs, scapula, pelvis, and right femur.  Bronchoscopy 08/21/2021:  CTA 08/18/2021: No evidence of pulmonary embolism.  3.2 cm x 2.0 cm x 2.2 cm heterogeneous soft lung mass within the right upper lobe with innumerable bilateral noncalcified lung nodules of various sizes, consistent with metastatic disease.  Moderate to marked severity subcarinal and right hilar lymphadenopathy.   Biopsies of Lymph Node 08/21/2021    Tobacco/Marijuana/Snuff/ETOH use: Current Smoker, trying to quit.  Past/Anticipated interventions by cardiothoracic surgery, if any:    Past/Anticipated interventions by medical oncology, if any:  Dr. Julien Nordmann 09/24/2021 - Started carboplatin Alimta and Keytruda on 09/02/2021. -I recommended for her to proceed with cycle #2 today as planned. -For the back pain, she is currently on Norco every 8 hours.  She takes her medication at regular basis with no improvement.  I offered switching her Norco to Percocet but the patient just received 90 tablets from her primary care physician and she is worried about insurance denial.   -I will refer her to the palliative care clinic for management of her pain issues. -For the bone metastasis, I will refer the patient to radiation oncology for palliative radiotherapy to the painful bone lesions. -The patient will come back for follow-up visit in 3 weeks for evaluation before the next cycle of  her treatment.   If Spine Met(s), symptoms, if any, include: Bowel/Bladder retention or incontinence (please describe): Has bladder changes since TVT 2013, wearing incontinent pads/briefs.  Reports increased urination lately, especially after chemo.  She notes constipation, was encouraged to use miralax BID. Numbness or weakness in extremities (please describe): Has baseline numbness in left hand, leg and foot. Current Decadron regimen, if applicable: Getting with Chemo.   Ambulatory status? Walker? Wheelchair?: Ambulatory   Signs/Symptoms Weight changes, if any: Has lost 3 pounds in the last month. Respiratory complaints, if any: She reports continued SOB increased with exertion, feels its better.  She uses her inhalers as prescribed. Hemoptysis, if any: She reports productive cough with clear phlegm. Pain issues, if any:  Bilateral hips, bilateral groin, lower back, mid back that radiates to left side into ribs.  She reports 6/10 pain with medication, 10-11/10 without medication.  SAFETY ISSUES: Prior radiation? No Pacemaker/ICD?  No Possible current pregnancy? Postmenopausal Is the patient on methotrexate? No  Current Complaints / other details:

## 2021-09-28 NOTE — Progress Notes (Signed)
Radiation Oncology         ([phone]) [phone] ________________________________  Name: Cassidy [name]        MRN: [id number]  Date of Service: 09/29/2021 DOB: 09-25-1965  CC:Curt Bears, MD  Curt Bears, MD     REFERRING PHYSICIAN: Curt Bears, MD   DIAGNOSIS: The primary encounter diagnosis was Adenocarcinoma of right lung, stage 4 ([place]). A diagnosis of Secondary malignant neoplasm of bone [facility]) was also pertinent to this visit.   HISTORY OF PRESENT ILLNESS: Cassidy [name] is a 56 y.o. female seen at the request of Dr. Julien Nordmann for a diagnosis of right-sided lung cancer.  The patient presented with a mass in the right upper lobe in addition to right hilar and mediastinal adenopathy in August 2023.  She had innumerable bilateral pulmonary nodules in addition to liver and bone metastases.  She underwent a bronchoscopy on 08/21/2021 and cytology from a station 7 lymph node showed malignant cells consistent with adenocarcinoma.  Additional molecular testing has shown a K-ras mutation.  She was started on carboplatin Alimta and Keytruda on 09/02/2021.  She has had painful bony disease and when she was last seen by Dr. Julien Nordmann he recommended referral for palliative radiotherapy.  She also underwent an MRI of the brain on 09/28/2021 and read is pending.  In retrospect her PET imaging from 08/28/2021 showed the known 3.4 cm right upper lobe mass, hypermetabolic adenopathy in the chest, and bilateral supraclavicular neck.  2 lesions in the liver were hypermetabolic, and the lesions in her skeleton involved the spine, ribs, scapula, pelvis, and right femur.  In the back it appeared that L4 was the site of disease.  She is seen to discuss palliative radiotherapy.  Of note while she was checking in she became acutely short of breath and rapid response was called from the cancer center, we are still trying to figure out who saw her and assessed her at that time however she apparently used her inhaler and  felt well enough to come down for our visit.    PREVIOUS RADIATION THERAPY: No   PAST MEDICAL HISTORY:  Past Medical History:  Diagnosis Date   Adenocarcinoma of right lung, stage 4 ([place]) 08/26/2021   Anemia    has sickle cell trait   Atherosclerotic heart disease of native coronary artery with angina pectoris ([place]) 05/2012, 10/2013   a.  s/p PCTA to dRCA and ostial RPAV-PLA vessel + PDA branch (05/2012)  b. [place] s/p DES to mLAD - Resolute DES 3.0 x 22 (3.2m -->3.3 mm)   Bipolar depression ([place]    Breast abscess    a. right side.    COPD (chronic obstructive pulmonary disease) (H[facility])    Depression    Diabetic peripheral neuropathy (H[facility])    Fibromyalgia    Genital warts    GERD (gastroesophageal reflux disease)    History of hiatal hernia    HLD (hyperlipidemia)    Hypertension    Pain in limb    a. LE VENOUS DUPLEX, 02/05/2009 - no evidence of deep vein thrombosis, Baker's cyst   Pneumonia    PTSD (post-traumatic stress disorder)    Sickle cell trait [place]    Tobacco abuse    Tooth caries    Type II diabetes mellitus ([place]        PAST SURGICAL HISTORY: Past Surgical History:  Procedure Laterality Date   BLADDER SURGERY  1980   "TVT"   BLADDER SURGERY  2003   "TVT"   BREAST  EXCISIONAL BIOPSY     BREAST SURGERY Right    I&D for multiple abscesses   CARDIAC CATHETERIZATION     CORONARY ARTERY BYPASS GRAFT N/A 06/30/2021   Procedure: CORONARY ARTERY BYPASS GRAFTING (CABG) x3 USING LEFT INTERNAL MAMMARY ARTERY,  RIGHT RADIAL ARTERY AND LEFT GREATER SAPHENOUS VEIN;  Surgeon: Lajuana Matte, MD;  Location: [place];  Service: Open Heart Surgery;  Laterality: N/A;   cutting balloon     ENDOVEIN HARVEST OF GREATER SAPHENOUS VEIN Left 06/30/2021   Procedure: ENDOVEIN HARVEST OF GREATER SAPHENOUS VEIN;  Surgeon: Lajuana Matte, MD;  Location: [place];  Service: Open Heart Surgery;  Laterality: Left;   EYE SURGERY Left    laser surgery   FINE NEEDLE ASPIRATION  08/21/2021    Procedure: FINE NEEDLE ASPIRATION (FNA) LINEAR;  Surgeon: Garner Nash, DO;  Location: [place] [facility];  Service: Pulmonary;;   INCISE AND DRAIN ABCESS  ; 04/26/11; 08/13/11   right breast   INCISION AND DRAINAGE ABSCESS Right 05/09/2012   Procedure: INCISION AND DRAINAGE RIGHT BREAST ABSCESS;  Surgeon: Imogene Burn. Georgette Dover, MD;  Location: [place];  Service: General;  Laterality: Right;   INCISION AND DRAINAGE ABSCESS Right 02/08/2014   Procedure: INCISION AND DRAINAGE RIGHT BREAST ABSCESS;  Surgeon: Donnie Mesa, MD;  Location: [place];  Service: General;  Laterality: Right;   INCISION AND DRAINAGE ABSCESS Right 06/14/2014   Procedure: INCISION AND DRAINAGE RIGHT BREAST ABSCESS;  Surgeon: Donnie Mesa, MD;  Location: [place];  Service: General;  Laterality: Right;   IRRIGATION AND DEBRIDEMENT ABSCESS  12/19/2010   Procedure: IRRIGATION AND DEBRIDEMENT ABSCESS;  Surgeon: Rolm Bookbinder, MD;  Location: [place];  Service: General;  Laterality: Right;   IRRIGATION AND DEBRIDEMENT ABSCESS  08/13/2011   Procedure: MINOR INCISION AND DRAINAGE OF ABSCESS;  Surgeon: Gwenyth Ober, MD;  Location: [place];  Service: General;  Laterality: Right;  Right Breast    IRRIGATION AND DEBRIDEMENT ABSCESS  11/17/2011   Procedure: IRRIGATION AND DEBRIDEMENT ABSCESS;  Surgeon: Imogene Burn. Georgette Dover, MD;  Location: [place];  Service: General;  Laterality: Right;  irrigation and debridement right recurrent breast abscess   LARYNX SURGERY     LEFT HEART CATH AND CORONARY ANGIOGRAPHY N/A 04/09/2021   Procedure: LEFT HEART CATH AND CORONARY ANGIOGRAPHY;  Surgeon: Jettie Booze, MD;  Location: [place] [facility];  Service: Cardiovascular;  Laterality: N/A;   LEFT HEART CATHETERIZATION WITH CORONARY ANGIOGRAM N/A 05/29/2012   Procedure: LEFT HEART CATHETERIZATION WITH CORONARY ANGIOGRAM & PTCA;  Surgeon: Troy Sine, MD;  Location: [facility] CATH LAB;  Service: Cardiovascular; Bifurcation dRCA-RPAD/PLA & rPDA  PTCA.   LEFT HEART  CATHETERIZATION WITH CORONARY ANGIOGRAM N/A 11/08/2013   Procedure: LEFT HEART CATHETERIZATION WITH CORONARY ANGIOGRAM and Coronary Stent Intervention;  Surgeon: Leonie Man, MD;  Location[facility];  Service: Cardiovascular; mLAD 80% (FFR 0.73) - resolute DES 3.0 x 22 mm (postdilated to 3.3 mm->3.5 mm)   NM MYOVIEW LTD  01/26/2009   Normal study, no evidence of ischemia, EF 67%   ployp removed from voice box 03/30/12     RADIAL ARTERY HARVEST Right 06/30/2021   Procedure: RADIAL ARTERY HARVEST;  Surgeon: Lajuana Matte, MD;  Location: [place];  Service: Open Heart Surgery;  Laterality: Right;   REFRACTIVE SURGERY  ~ 2010   right   TEE WITHOUT CARDIOVERSION N/A 06/30/2021   Procedure: TRANSESOPHAGEAL ECHOCARDIOGRAM (TEE);  Surgeon: Lajuana Matte, MD;  Location: [place];  Service: Open Heart Surgery;  Laterality: N/A;   TONSILLECTOMY  1988   VIDEO BRONCHOSCOPY WITH ENDOBRONCHIAL ULTRASOUND Bilateral 08/21/2021   Procedure: VIDEO BRONCHOSCOPY WITH ENDOBRONCHIAL ULTRASOUND;  Surgeon: Garner Nash, DO;  Location: [place];  Service: Pulmonary;  Laterality: Bilateral;     FAMILY HISTORY:  Family History  Problem Relation Age of Onset   Esophageal cancer Mother    COPD Mother    Cancer Mother        lymphoma   Heart disease Mother    Stomach cancer Mother    Hyperlipidemia Father    Heart disease Father    Cancer Maternal Aunt        breast, colon   Crohn's disease Maternal Aunt    Hypertension Maternal Grandmother    Diabetes Maternal Grandmother    Hypertension Brother    Rectal cancer Neg Hx    Liver cancer Neg Hx    Colon cancer Neg Hx    Breast cancer Neg Hx      SOCIAL HISTORY:  reports that she has been smoking cigarettes. She started smoking about 42 years ago. She has a 21.00 pack-year smoking history. She has never used smokeless tobacco. She reports that she does not currently use drugs. She reports that she does not drink alcohol.  The patient is  single and resides in [place].  She has an adult son who also helps with her medical decision making at times.   ALLERGIES: Amoxil [amoxicillin], Chantix [varenicline], Suboxone [buprenorphine hcl-naloxone hcl], and Emend [fosaprepitant dimeglumine]   MEDICATIONS:  Current Outpatient Medications  Medication Sig Dispense Refill   ACCU-CHEK AVIVA PLUS test strip TEST BLOOD SUGAR THREE TIMES DAILY AS DIRECTED 50 each 1   amLODipine (NORVASC) 5 MG tablet Take 1 tablet (5 mg total) by mouth at bedtime. 90 tablet 3   aspirin EC 81 MG tablet Take 81 mg by mouth daily.     atorvastatin (LIPITOR) 40 MG tablet TAKE 1 TABLET (40 MG TOTAL) BY MOUTH DAILY. 90 tablet 3   busPIRone (BUSPAR) 15 MG tablet TAKE 1/2 TABLET (7.5 MG TOTAL) BY MOUTH TWICE DAILY (Patient taking differently: Take 7.5 mg by mouth 2 (two) times daily.) 30 tablet 11   Carboxymethylcellul-Glycerin (LUBRICATING EYE DROPS OP) Place 1 drop into both eyes daily as needed (dry eyes).     carvedilol (COREG) 12.5 MG tablet Take 1 tablet (12.5 mg total) by mouth 2 (two) times daily with a meal. 180 tablet 1   dexamethasone (DECADRON) 2 MG tablet Take 1 tablet (2 mg total) by mouth daily. 30 tablet 0   diclofenac Sodium (VOLTAREN) 1 % GEL Apply 1 application topically 3 times daily as needed for pain 2 g 3   ferrous sulfate 325 (65 FE) MG tablet Take 325 mg by mouth daily with breakfast.     fluticasone (FLONASE) 50 MCG/ACT nasal spray PLACE 1 SPRAY INTO BOTH NOSTRILS DAILY AS NEEDED (CONGESTION). 16 g 1   fluticasone-salmeterol (ADVAIR) 100-50 MCG/ACT AEPB Inhale 1 puff into the lungs 2 (two) times daily. 1 each 3   folic acid (FOLVITE) 1 MG tablet Take 1 tablet (1 mg total) by mouth daily. 30 tablet 3   HYDROcodone-acetaminophen (NORCO/VICODIN) 5-325 MG tablet Take 1 tablet by mouth every 8 (eight) hours. 90 tablet 0   ipratropium (ATROVENT HFA) 17 MCG/ACT inhaler Inhale 2 puffs into the lungs every 4 (four) hours as needed for wheezing  (COPD).     isosorbide mononitrate (IMDUR) 30 MG 24 hr  tablet Take 30 mg by mouth daily.     nicotine polacrilex (NICORETTE) 2 MG gum Take 1 each (2 mg total) by mouth as needed for smoking cessation. 100 tablet 0   nitroGLYCERIN (NITROSTAT) 0.4 MG SL tablet Place 1 tablet (0.4 mg total) under the tongue every 5 (five) minutes as needed for chest pain. 25 tablet 5   nystatin (MYCOSTATIN) 100000 UNIT/ML suspension Take 5 mLs (500,000 Units total) by mouth 4 (four) times daily. 60 mL 0   pantoprazole (PROTONIX) 40 MG tablet TAKE 1 TABLET (40 MG TOTAL) BY MOUTH DAILY. 30 tablet 1   prochlorperazine (COMPAZINE) 10 MG tablet Take 1 tablet (10 mg total) by mouth every 6 (six) hours as needed for nausea or vomiting. 30 tablet 0   sertraline (ZOLOFT) 50 MG tablet TAKE 1 TABLET (50 MG TOTAL) BY MOUTH DAILY. 30 tablet 3   valACYclovir (VALTREX) 1000 MG tablet TAKE FOR 3 DAYS AS NEEDED FOR EACH OUTBREAK 30 tablet 11   vitamin B-12 (CYANOCOBALAMIN) 1000 MCG tablet Take 1,000 mcg by mouth daily.     diphenhydramine-acetaminophen (TYLENOL PM) 25-500 MG TABS tablet Take 1 tablet by mouth at bedtime as needed (sleep). (Patient not taking: Reported on 09/07/2021)     Dulaglutide (TRULICITY) 2.56 LS/9.3TD SOPN Inject 0.75 mg into the skin once a week. (Patient not taking: Reported on 09/29/2021) 2 mL 0   furosemide (LASIX) 40 MG tablet Take 1 tablet (40 mg total) by mouth daily as needed. If you gain 3 lbs in 24 hours or 5 lbs in 1 week (Patient not taking: Reported on 09/07/2021) 30 tablet 1   LINZESS 290 MCG CAPS capsule TAKE 1 CAPSULE (290 MCG TOTAL) BY MOUTH DAILY BEFORE BREAKFAST. (Patient not taking: Reported on 09/07/2021) 30 capsule 3   losartan (COZAAR) 50 MG tablet Take 50 mg by mouth daily. (Patient not taking: Reported on 09/07/2021)     methocarbamol (ROBAXIN) 500 MG tablet Take 1 tablet (500 mg total) by mouth every 8 (eight) hours as needed for muscle spasms. (Patient not taking: Reported on 09/07/2021) 20  tablet 0   oxyCODONE-acetaminophen (PERCOCET) 5-325 MG tablet Take 1-2 tablets by mouth every 6 (six) hours as needed for severe pain. (Patient not taking: Reported on 09/07/2021) 45 tablet 0   potassium chloride SA (KLOR-CON M) 20 MEQ tablet Take 1 tablet (20 mEq total) by mouth daily as needed. On days you take Lasix (Patient not taking: Reported on 09/07/2021) 30 tablet 1   No current facility-administered medications for this encounter.     REVIEW OF SYSTEMS: On review of systems, the patient reports that she is doing okay. She's been struggling with getting comfortable and reports that her pain is in her upper back which predates her cancer diagnosis as well as pain in her lower spine radiating down into her buttocks and anal region.  She states for several weeks she has been having on and off symptoms of numbness of her perineum.  She was seen by OB/GYN thinking that she might have a yeast infection but did not have any visible evidence of this on examination.  She denies any foot drop but does acknowledge radicular symptoms into her groin and down the side of her legs into her pinky toes.  She reports that she is almost out of her oxycodone and is interested in hearing what palliative medicine would say about pain management options.  She has previously had a significant GI reaction to Suboxone.  She also acknowledges being  clean of drugs and alcohol for many years but because of this reports that her tolerance for pain is minimized and he does take a lot more medication to help try and control her pain.  She also reports that at nighttime if she wakes up to you go to the bathroom she has a hard time recognizing that she is already urinating.  She states that this has changed in the last 3 months and because of this has been wearing depends.  She also reports poorly controlled diabetes as she has been trying to work with her PCP regarding escalating her medication possibly considering insulin.  She  states that her blood sugars can regularly be in the 200 range.  She denies any shortness of breath currently at rest but feels that she had a panic attack up in our cancer center lobby.  She denies hemoptysis and reports that she takes Mucinex to try and alleviate mucus production.  She is concerned as well about the recency of her open heart surgery.  She has a nodule on her left arm that is going to be evaluated by ultrasound later this week.  She has lost weight unintentionally and reports she is down between 10 to 15 pounds.  No other complaints are verbalized.   PHYSICAL EXAM:  Wt Readings from Last 3 Encounters:  09/29/21 201 lb 3.2 oz (91.3 kg)  09/29/21 201 lb 3.2 oz (91.3 kg)  09/28/21 205 lb 12.8 oz (93.4 kg)   Temp Readings from Last 3 Encounters:  09/29/21 98.2 F (36.8 C)  09/29/21 98.2 F (36.8 C) (Oral)  09/28/21 97.9 F (36.6 C) (Oral)   BP Readings from Last 3 Encounters:  09/29/21 (!) 103/45  09/29/21 (!) 103/45  09/28/21 125/79   Pulse Readings from Last 3 Encounters:  09/29/21 87  09/29/21 87  09/28/21 87   Pain Assessment Pain Score: 6  Pain Loc: Generalized (back, hips, groin)/10 Oxygen level was 100% on room air.  In general this is a well appearing African-American female in no acute distress.  She's alert and oriented x4 and appropriate throughout the examination. Cardiopulmonary assessment is negative for acute distress and she exhibits normal effort.     ECOG = 1  0 - Asymptomatic (Fully active, able to carry on all predisease activities without restriction)  1 - Symptomatic but completely ambulatory (Restricted in physically strenuous activity but ambulatory and able to carry out work of a light or sedentary nature. For example, light housework, office work)  2 - Symptomatic, <50% in bed during the day (Ambulatory and capable of all self care but unable to carry out any work activities. Up and about more than 50% of waking hours)  3 -  Symptomatic, >50% in bed, but not bedbound (Capable of only limited self-care, confined to bed or chair 50% or more of waking hours)  4 - Bedbound (Completely disabled. Cannot carry on any self-care. Totally confined to bed or chair)  5 - Death   Eustace Pen MM, Creech RH, Tormey DC, et al. [phone]). "Toxicity and response criteria of the [facility]". [place] Oncol. 5 (6): 649-55    LABORATORY DATA:  Lab Results  Component Value Date   WBC 7.5 09/24/2021   HGB 12.0 09/24/2021   HCT 35.5 (L) 09/24/2021   MCV 85.7 09/24/2021   PLT 404 (H) 09/24/2021   Lab Results  Component Value Date   NA 137 09/24/2021   K 4.1 09/24/2021   CL 106  09/24/2021   CO2 22 09/24/2021   Lab Results  Component Value Date   ALT 23 09/24/2021   AST 20 09/24/2021   ALKPHOS 99 09/24/2021   BILITOT 0.7 09/24/2021      RADIOGRAPHY: No results found.     IMPRESSION/PLAN: 1. Stage IV, NSCLC, adenocarcinoma with K-ras mutation of the RUL involving the lungs, nodes of the chest, liver and bones. Dr. Lisbeth Renshaw discusses the pathology findings and reviews the nature of . metastatic cancer as well as the use of palliative radiotherapy.  We discussed the rationale to proceed with a stat CTPA to rule out pulmonary embolism given her recent surgery and ongoing cancer diagnosis.  She is aware that this study could diagnose of pulmonary embolism versus potentially show persistence of her nodal disease compressing her airway if there is compression of her airway, it would be reasonable to consider a palliative course of radiotherapy to the chest as well as to the lumbosacral spine. We discussed the risks, benefits, short, and long term effects of radiotherapy, as well as the curative intent, and the patient is interested in proceeding. Dr. Lisbeth Renshaw discusses the delivery and logistics of radiotherapy and anticipates a course of 3 weeks of radiotherapy to the lumbosacral spine and possibly to the chest.   Written consent is obtained and placed in the chart, a copy was provided to the patient.  She will simulate today and we anticipate that she will start radiation this Thursday.. 2. Shortness of breath with recent surgery and history of malignancy.  As above we are concerned about the patient's episode of shortness of breath that occurred acutely today in the cancer center.  While her inhaler did alleviate her symptoms, we would like to rule out other concerns for blood clot and if she still has significant compression of her right mainstem bronchus, would consider a palliative course of radiotherapy, this will help as a diagnostic assessment of her disease burden as well.  She is in agreement with this plan. 3. Neurologic symptoms.  Patient does have radicular symptoms of her spinal nerve roots in the sacral region.  We discussed the concerns about wanting to consider low-dose steroids but have also discussed the implications that this has regarding her Beryle Flock and effectiveness of cancer treatment, as well as risks to her blood sugar which at baseline is already poorly controlled.  She is encouraged to contact her PCP for help with managing her blood sugar.  She may need to consider insulin if this is not well controlled.  If we were to start steroids this would certainly need to be considered as well.  She is aware that if her radiculopathy in the genital region, or changes in symptoms including development of foot drop, or complete loss of bowel or bladder function were to occur and we would recommend that she begin steroids.  I did send in a prescription for dexamethasone 2 mg to be taken once daily if her symptoms were to progress, we would significantly increase the dose if needed depending on her neurologic symptoms at that time.  She is in agreement with this plan and will pick up the prescription but not start unless she develops symptoms and also let us know about the symptoms. 4. Pain management.  The  patient does have a complex history of polysubstance use.  She is open about her history and also forthcoming about her desires for maintaining pain management but also balancing risks of addiction.  We appreciate palliative care who is  seeing her tomorrow and we will notify them of our discussion as well.  Hopefully however her symptoms will improve with radiotherapy in the lumbosacral spine.  In a visit lasting 60 minutes, greater than 50% of the time was spent face to face discussing the patient's condition, in preparation for the discussion, and coordinating the patient's care.   The above documentation reflects my direct findings during this shared patient visit. Please see the separate note by Dr. Lisbeth Renshaw on this date for the remainder of the patient's plan of care.    Carola Rhine, [facility]   **Disclaimer: This note was dictated with voice recognition software. Similar sounding words can inadvertently be transcribed and this note may contain transcription errors which may not have been corrected upon publication of note.**

## 2021-09-28 NOTE — Progress Notes (Signed)
Error

## 2021-09-28 NOTE — Progress Notes (Signed)
Symptom Management Consult note [place]    Patient Care Team: Curt Bears, MD as PCP - General (Oncology) O'Neal, Cassie Freer, MD as PCP - Cardiology (Cardiology) Rolm Bookbinder, MD as Consulting Physician (General Surgery)    Name of the patient: Cassidy [name]  [id number]  11/20/1965   Date of visit: 09/28/2021   Chief Complaint/Reason for visit: IV site tenderness   Current Therapy: Carboplatin, Keytruda, Pemetrexed  Last treatment:  Day 1   Cycle 2 on 09/24/21   ASSESSMENT & PLAN: Patient is a 56 y.o. female  with oncologic history of  stage IV (T2a, N2, M1c) non-small cell lung cancer, adenocarcinoma presented with right upper lobe lung mass in addition to right hilar and mediastinal lymphadenopathy and innumerable bilateral pulmonary nodules as well as liver and bone metastasis followed by Dr. Julien Nordmann.  I have viewed most recent oncology note and lab work.    #)Stage IV (T2a, N2, M1c) non-small cell lung cancer, adenocarcinoma presented with right upper lobe lung mass in addition to right hilar and mediastinal lymphadenopathy and innumerable bilateral pulmonary nodules as well as liver and bone metastasis - Next appointment with oncologist is 10/14/21. She is scheduled for radiation consult 09/19 and to establish care with palliative team 09/20.  #)Arm pain -Patient nontoxic appearing.  -Exam is suggestive of superficial thrombophlebitis. No signs of cellulitis. Discussed symptomatic care including warm compresses. She avoids NSAIDs because of recent cardiac procedure (CABG 06/2021) -Will send patient for DVT to r/o DVT. She requests it to be scheduled for 09/20 because she has a full day of appointment already scheduled for tomorrow -Labs from 09/14 without anemia, platelets 404.  - Strict ED precautions discussed should symptoms worsen.  #)Thrush -Has history 2/2 inhaler use. Noticed symptoms starting after recent chemo. -Exam consistent  with thrush.  -Will prescribe Nystatin per patient's request as she knows insurance will cover it and hold off on magic mouthwash at this time.  Heme/Onc History: Oncology History  Adenocarcinoma of right lung, stage 4 ([place])  08/26/2021 Initial Diagnosis   Adenocarcinoma of right lung, stage 4 ([place])   09/02/2021 -  Chemotherapy   Patient is on Treatment Plan : LUNG Carboplatin (5) + Pemetrexed (500) + Pembrolizumab (200) D1 q21d Induction x 4 cycles / Maintenance Pemetrexed (500) + Pembrolizumab (200) D1 q21d     09/02/2021 Cancer Staging   Staging form: Lung, AJCC 8th Edition - Clinical: Stage IVB (cT2a, cN2, cM1c) - Signed by Curt Bears, MD on 09/02/2021       Interval history-: Cassidy [name] is a 56 y.o. female with oncologic history as above presenting to [facility] today with chief complaint of IV site tenderness on left forearm x1 week.  Patient states her first chemo was 09/02/21 with IV in her left arm and she overall tolerated it well.  She noticed 1 week ago that she had some swelling and redness under her left arm around IV site.  She reports noticing a knot became visible x4 days ago.  She states the knot is tender and has a stinging sensation.  She rates the pain 7 out of 10 in severity.  Pain radiates up and down her arm.  She tried cleaning the area with rubbing alcohol however symptoms did not improve.  She did not take any over-the-counter medications prior to arrival.  She has normal range of motion of her left arm.  She states with her treatment on 9/14 the IV was in her  right arm and she has had no problems with that.  She denies any fevers, chills, chest pain, shortness of breath, numbness or tingling.  Patient is also concerned that she has thrush.  She noticed the white patches on her tongue within the last week.  She has history of thrush because of her inhaler use.  She has nystatin at home however the bottle is otherwise empty.  She is requesting a refill of nystatin  today because she knows that it will work well for her as it has in the past and also that she will be able to afford it.       ROS  All other systems are reviewed and are negative for acute change except as noted in the HPI.    Allergies  Allergen Reactions   Amoxil [Amoxicillin] Hives    Has patient had a PCN reaction causing immediate rash, facial/tongue/throat swelling, SOB or lightheadedness with hypotension: Yes Has patient had a PCN reaction causing severe rash involving mucus membranes or skin necrosis: No Has patient had a PCN reaction that required hospitalization No Has patient had a PCN reaction occurring within the last 10 years: Yes If all of the above answers are "NO", then may proceed with Cephalosporin use.   Chantix [Varenicline] Other (See Comments)    Nightmares Hallucinations     Suboxone [Buprenorphine Hcl-Naloxone Hcl] Nausea And Vomiting   Emend [Fosaprepitant Dimeglumine] Itching    See notes for transfusion on 8/23. Pt reported new onset of itching after Emend infusion was started. 25mg  Benadryl IV given per MD. Pt tolerated remaining infusion well.      Past Medical History:  Diagnosis Date   Adenocarcinoma of right lung, stage 4 ([place]) 08/26/2021   Anemia    has sickle cell trait   Atherosclerotic heart disease of native coronary artery with angina pectoris ([place]) 05/2012, 10/2013   a.  s/p PCTA to dRCA and ostial RPAV-PLA vessel + PDA branch (05/2012)  b. [place] s/p DES to mLAD - Resolute DES 3.0 x 22 (3.53mm -->3.3 mm)   Bipolar depression ([place])    Breast abscess    a. right side.    COPD (chronic obstructive pulmonary disease) (HCC)    Depression    Diabetic peripheral neuropathy (HCC)    Fibromyalgia    Genital warts    GERD (gastroesophageal reflux disease)    History of hiatal hernia    HLD (hyperlipidemia)    Hypertension    Pain in limb    a. LE VENOUS DUPLEX, 02/05/2009 - no evidence of deep vein thrombosis, Baker's cyst   Pneumonia    PTSD  (post-traumatic stress disorder)    Sickle cell trait ([place])    Tobacco abuse    Tooth caries    Type II diabetes mellitus ([place])      Past Surgical History:  Procedure Laterality Date   BLADDER SURGERY  1980   "TVT"   BLADDER SURGERY  2003   "TVT"   BREAST EXCISIONAL BIOPSY     BREAST SURGERY Right    I&D for multiple abscesses   CARDIAC CATHETERIZATION     CORONARY ARTERY BYPASS GRAFT N/A 06/30/2021   Procedure: CORONARY ARTERY BYPASS GRAFTING (CABG) x3 USING LEFT INTERNAL MAMMARY ARTERY,  RIGHT RADIAL ARTERY AND LEFT GREATER SAPHENOUS VEIN;  Surgeon: Lajuana Matte, MD;  Location: [place];  Service: Open Heart Surgery;  Laterality: N/A;   cutting balloon     ENDOVEIN HARVEST OF GREATER SAPHENOUS VEIN Left 06/30/2021  Procedure: ENDOVEIN HARVEST OF GREATER SAPHENOUS VEIN;  Surgeon: Lajuana Matte, MD;  Location: [place];  Service: Open Heart Surgery;  Laterality: Left;   EYE SURGERY Left    laser surgery   FINE NEEDLE ASPIRATION  08/21/2021   Procedure: FINE NEEDLE ASPIRATION (FNA) LINEAR;  Surgeon: Garner Nash, DO;  Location: [facility];  Service: Pulmonary;;   INCISE AND DRAIN ABCESS  ; 04/26/11; 08/13/11   right breast   INCISION AND DRAINAGE ABSCESS Right 05/09/2012   Procedure: INCISION AND DRAINAGE RIGHT BREAST ABSCESS;  Surgeon: Imogene Burn. Georgette Dover, MD;  Location: [place];  Service: General;  Laterality: Right;   INCISION AND DRAINAGE ABSCESS Right 02/08/2014   Procedure: INCISION AND DRAINAGE RIGHT BREAST ABSCESS;  Surgeon: Donnie Mesa, MD;  Location: [place];  Service: General;  Laterality: Right;   INCISION AND DRAINAGE ABSCESS Right 06/14/2014   Procedure: INCISION AND DRAINAGE RIGHT BREAST ABSCESS;  Surgeon: Donnie Mesa, MD;  Location: [place];  Service: General;  Laterality: Right;   IRRIGATION AND DEBRIDEMENT ABSCESS  12/19/2010   Procedure: IRRIGATION AND DEBRIDEMENT ABSCESS;  Surgeon: Rolm Bookbinder, MD;  Location: [place];  Service: General;  Laterality:  Right;   IRRIGATION AND DEBRIDEMENT ABSCESS  08/13/2011   Procedure: MINOR INCISION AND DRAINAGE OF ABSCESS;  Surgeon: Gwenyth Ober, MD;  Location: [place];  Service: General;  Laterality: Right;  Right Breast    IRRIGATION AND DEBRIDEMENT ABSCESS  11/17/2011   Procedure: IRRIGATION AND DEBRIDEMENT ABSCESS;  Surgeon: Imogene Burn. Georgette Dover, MD;  Location: [place];  Service: General;  Laterality: Right;  irrigation and debridement right recurrent breast abscess   LARYNX SURGERY     LEFT HEART CATH AND CORONARY ANGIOGRAPHY N/A 04/09/2021   Procedure: LEFT HEART CATH AND CORONARY ANGIOGRAPHY;  Surgeon: Jettie Booze, MD;  Location: [place] [facility];  Service: Cardiovascular;  Laterality: N/A;   LEFT HEART CATHETERIZATION WITH CORONARY ANGIOGRAM N/A 05/29/2012   Procedure: LEFT HEART CATHETERIZATION WITH CORONARY ANGIOGRAM & PTCA;  Surgeon: Troy Sine, MD;  Location: [facility] CATH LAB;  Service: Cardiovascular; Bifurcation dRCA-RPAD/PLA & rPDA  PTCA.   LEFT HEART CATHETERIZATION WITH CORONARY ANGIOGRAM N/A 11/08/2013   Procedure: LEFT HEART CATHETERIZATION WITH CORONARY ANGIOGRAM and Coronary Stent Intervention;  Surgeon: Leonie Man, MD;  Location: [facility] CATH LAB;  Service: Cardiovascular; mLAD 80% (FFR 0.73) - resolute DES 3.0 x 22 mm (postdilated to 3.3 mm->3.5 mm)   NM MYOVIEW LTD  01/26/2009   Normal study, no evidence of ischemia, EF 67%   ployp removed from voice box 03/30/12     RADIAL ARTERY HARVEST Right 06/30/2021   Procedure: RADIAL ARTERY HARVEST;  Surgeon: Lajuana Matte, MD;  Location: [place];  Service: Open Heart Surgery;  Laterality: Right;   REFRACTIVE SURGERY  ~ 2010   right   TEE WITHOUT CARDIOVERSION N/A 06/30/2021   Procedure: TRANSESOPHAGEAL ECHOCARDIOGRAM (TEE);  Surgeon: Lajuana Matte, MD;  Location: [place];  Service: Open Heart Surgery;  Laterality: N/A;   TONSILLECTOMY  1988   VIDEO BRONCHOSCOPY WITH ENDOBRONCHIAL ULTRASOUND Bilateral 08/21/2021   Procedure: VIDEO  BRONCHOSCOPY WITH ENDOBRONCHIAL ULTRASOUND;  Surgeon: Garner Nash, DO;  Location: [place];  Service: Pulmonary;  Laterality: Bilateral;    Social History   Socioeconomic History   Marital status: Single    Spouse name: Not on file   Number of children: 1   Years of education: Not on file   Highest education level: Not on file  Occupational History   Occupation: homemaker   Occupation: disabled  Tobacco Use   Smoking status: Former    Packs/day: 0.50    Years: 42.00    Total pack years: 21.00    Types: Cigarettes    Start date: 01/12/1979   Smokeless tobacco: Never   Tobacco comments:    08/14/11 "quit for 3 wk 05/2011; was smoking 1 ppd since age 31 before I quit; at least I've cut down to 1/4 ppd",  Smoking 3 per day 08/20/2021. 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, marijuana, crack cocaine)   Drug use: Not Currently    Comment: 08/14/11 'quit everything in 2005"   Sexual activity: Yes    Partners: Male    Birth control/protection: Condom  Other Topics Concern   Not on file  Social History Narrative   Work: disability   Children: son, 30 yrs old   Regular exercise: some/ heart attack in May/ walks 1 mile 3 days a week   Caffeine use: daily, cup of coffee daily   Social Determinants of Health   Financial Resource Strain: High Risk (09/08/2021)   Overall Financial Resource Strain (CARDIA)    Difficulty of Paying Living Expenses: Very hard  Food Insecurity: Not on file  Transportation Needs: Not on file  Physical Activity: Not on file  Stress: Not on file  Social Connections: Not on file  Intimate Partner Violence: Not on file    Family History  Problem Relation Age of Onset   Esophageal cancer Mother    COPD Mother    Cancer Mother        lymphoma   Heart disease Mother    Stomach cancer Mother     Hyperlipidemia Father    Heart disease Father    Cancer Maternal Aunt        breast, colon   Crohn's disease Maternal Aunt    Hypertension Maternal Grandmother    Diabetes Maternal Grandmother    Hypertension Brother    Rectal cancer Neg Hx    Liver cancer Neg Hx    Colon cancer Neg Hx    Breast cancer Neg Hx      Current Outpatient Medications:    nystatin (MYCOSTATIN) 100000 UNIT/ML suspension, Take 5 mLs (500,000 Units total) by mouth 4 (four) times daily., Disp: 60 mL, Rfl: 0   ACCU-CHEK AVIVA PLUS test strip, TEST BLOOD SUGAR THREE TIMES DAILY AS DIRECTED, Disp: 50 each, Rfl: 1   amLODipine (NORVASC) 5 MG tablet, Take 1 tablet (5 mg total) by mouth at bedtime., Disp: 90 tablet, Rfl: 3   aspirin EC 81 MG tablet, Take 81 mg by mouth daily., Disp: , Rfl:    atorvastatin (LIPITOR) 40 MG tablet, TAKE 1 TABLET (40 MG TOTAL) BY MOUTH DAILY., Disp: 90 tablet, Rfl: 3   busPIRone (BUSPAR) 15 MG tablet, TAKE 1/2 TABLET (7.5 MG TOTAL) BY MOUTH TWICE DAILY (Patient taking differently: Take 7.5 mg by mouth 2 (two) times daily.), Disp: 30 tablet, Rfl: 11   Carboxymethylcellul-Glycerin (LUBRICATING EYE DROPS OP), Place 1 drop into both eyes daily as needed (dry eyes)., Disp: , Rfl:    carvedilol (COREG) 12.5 MG tablet, Take 1 tablet (12.5 mg total) by mouth 2 (two) times daily with a meal., Disp: 180 tablet, Rfl: 1   diclofenac Sodium (VOLTAREN)  1 % GEL, Apply 1 application topically 3 times daily as needed for pain, Disp: 2 g, Rfl: 3   diphenhydramine-acetaminophen (TYLENOL PM) 25-500 MG TABS tablet, Take 1 tablet by mouth at bedtime as needed (sleep). (Patient not taking: Reported on 09/07/2021), Disp: , Rfl:    Dulaglutide (TRULICITY) 4.33 IR/5.1OA SOPN, Inject 0.75 mg into the skin once a week. (Patient taking differently: Inject 0.75 mg into the skin every Monday.), Disp: 2 mL, Rfl: 0   ferrous sulfate 325 (65 FE) MG tablet, Take 325 mg by mouth daily with breakfast., Disp: , Rfl:    fluticasone  (FLONASE) 50 MCG/ACT nasal spray, PLACE 1 SPRAY INTO BOTH NOSTRILS DAILY AS NEEDED (CONGESTION)., Disp: 16 g, Rfl: 1   fluticasone-salmeterol (ADVAIR) 100-50 MCG/ACT AEPB, Inhale 1 puff into the lungs 2 (two) times daily., Disp: 1 each, Rfl: 3   folic acid (FOLVITE) 1 MG tablet, Take 1 tablet (1 mg total) by mouth daily., Disp: 30 tablet, Rfl: 3   furosemide (LASIX) 40 MG tablet, Take 1 tablet (40 mg total) by mouth daily as needed. If you gain 3 lbs in 24 hours or 5 lbs in 1 week (Patient not taking: Reported on 09/07/2021), Disp: 30 tablet, Rfl: 1   HYDROcodone-acetaminophen (NORCO/VICODIN) 5-325 MG tablet, Take 1 tablet by mouth every 8 (eight) hours., Disp: 90 tablet, Rfl: 0   ipratropium (ATROVENT HFA) 17 MCG/ACT inhaler, Inhale 2 puffs into the lungs every 4 (four) hours as needed for wheezing (COPD)., Disp: , Rfl:    isosorbide mononitrate (IMDUR) 30 MG 24 hr tablet, Take 30 mg by mouth daily. (Patient not taking: Reported on 08/31/2021), Disp: , Rfl:    LINZESS 290 MCG CAPS capsule, TAKE 1 CAPSULE (290 MCG TOTAL) BY MOUTH DAILY BEFORE BREAKFAST. (Patient not taking: Reported on 09/07/2021), Disp: 30 capsule, Rfl: 3   losartan (COZAAR) 50 MG tablet, Take 50 mg by mouth daily. (Patient not taking: Reported on 09/07/2021), Disp: , Rfl:    methocarbamol (ROBAXIN) 500 MG tablet, Take 1 tablet (500 mg total) by mouth every 8 (eight) hours as needed for muscle spasms. (Patient not taking: Reported on 09/07/2021), Disp: 20 tablet, Rfl: 0   nicotine polacrilex (NICORETTE) 2 MG gum, Take 1 each (2 mg total) by mouth as needed for smoking cessation., Disp: 100 tablet, Rfl: 0   nitroGLYCERIN (NITROSTAT) 0.4 MG SL tablet, Place 1 tablet (0.4 mg total) under the tongue every 5 (five) minutes as needed for chest pain., Disp: 25 tablet, Rfl: 5   oxyCODONE-acetaminophen (PERCOCET) 5-325 MG tablet, Take 1-2 tablets by mouth every 6 (six) hours as needed for severe pain. (Patient not taking: Reported on 09/07/2021),  Disp: 45 tablet, Rfl: 0   pantoprazole (PROTONIX) 40 MG tablet, TAKE 1 TABLET (40 MG TOTAL) BY MOUTH DAILY., Disp: 30 tablet, Rfl: 1   potassium chloride SA (KLOR-CON M) 20 MEQ tablet, Take 1 tablet (20 mEq total) by mouth daily as needed. On days you take Lasix (Patient not taking: Reported on 09/07/2021), Disp: 30 tablet, Rfl: 1   prochlorperazine (COMPAZINE) 10 MG tablet, Take 1 tablet (10 mg total) by mouth every 6 (six) hours as needed for nausea or vomiting., Disp: 30 tablet, Rfl: 0   sertraline (ZOLOFT) 50 MG tablet, TAKE 1 TABLET (50 MG TOTAL) BY MOUTH DAILY., Disp: 30 tablet, Rfl: 3   valACYclovir (VALTREX) 1000 MG tablet, TAKE FOR 3 DAYS AS NEEDED FOR EACH OUTBREAK, Disp: 30 tablet, Rfl: 11   vitamin B-12 (CYANOCOBALAMIN) 1000 MCG  tablet, Take 1,000 mcg by mouth daily., Disp: , Rfl:   PHYSICAL EXAM: ECOG FS:1 - Symptomatic but completely ambulatory    Vitals:   09/28/21 1423  BP: 125/79  Pulse: 87  Resp: 16  Temp: 97.9 F (36.6 C)  TempSrc: Oral  SpO2: 100%  Weight: 205 lb 12.8 oz (93.4 kg)  Height: 5' 7.5" (1.715 m)   Physical Exam Vitals and nursing note reviewed.  Constitutional:      Appearance: She is well-developed. She is not ill-appearing or toxic-appearing.  HENT:     Head: Normocephalic.     Nose: Nose normal.     Mouth/Throat:     Comments: White patches on tongue. No sores, Eyes:     Conjunctiva/sclera: Conjunctivae normal.  Neck:     Vascular: No JVD.  Cardiovascular:     Rate and Rhythm: Normal rate and regular rhythm.     Pulses: Normal pulses.          Radial pulses are 2+ on the right side and 2+ on the left side.     Heart sounds: Normal heart sounds.  Pulmonary:     Effort: Pulmonary effort is normal.     Breath sounds: Normal breath sounds.  Abdominal:     General: There is no distension.  Musculoskeletal:     Cervical back: Normal range of motion.     Right lower leg: No edema.     Left lower leg: No edema.     Comments: Nickel sized  raised area of swelling on left forearm. No palpable fluctuance. Tender to the touch with warmth.  Compartments are soft in left upper extremity and left upper extremity is neurovascularly intact.  Skin:    General: Skin is warm and dry.     Findings: No rash.  Neurological:     Mental Status: She is oriented to person, place, and time.        LABORATORY DATA: I have reviewed the data as listed    Latest Ref Rng & Units 09/24/2021    9:55 AM 09/09/2021    3:04 PM 09/02/2021   10:07 AM  CBC  WBC 4.0 - 10.5 K/uL 7.5  3.5  10.1   Hemoglobin 12.0 - 15.0 g/dL 12.0  12.4  12.5   Hematocrit 36.0 - 46.0 % 35.5  35.9  36.1   Platelets 150 - 400 K/uL 404  209  351         Latest Ref Rng & Units 09/24/2021    9:55 AM 09/09/2021    3:04 PM 09/02/2021   10:07 AM  CMP  Glucose 70 - 99 mg/dL 275  139  167   BUN 6 - 20 mg/dL 11  15  10    Creatinine 0.44 - 1.00 mg/dL 0.68  0.69  0.67   Sodium 135 - 145 mmol/L 137  141  138   Potassium 3.5 - 5.1 mmol/L 4.1  4.0  3.6   Chloride 98 - 111 mmol/L 106  109  106   CO2 22 - 32 mmol/L 22  24  28    Calcium 8.9 - 10.3 mg/dL 9.1  9.1  9.3   Total Protein 6.5 - 8.1 g/dL 7.2  7.4  6.9   Total Bilirubin 0.3 - 1.2 mg/dL 0.7  0.6  0.6   Alkaline Phos 38 - 126 U/L 99  83  91   AST 15 - 41 U/L 20  14  11    ALT 0 - 44 U/L  23  14  10         RADIOGRAPHIC STUDIES (from last 24 hours if applicable) I have personally reviewed the radiological images as listed and agreed with the findings in the report. No results found.      Visit Diagnosis: 1. Adenocarcinoma of right lung, stage 4 ([place])   2. Thrush, oral   3. Pain of left upper extremity      No orders of the defined types were placed in this encounter.   All questions were answered. The patient knows to call the clinic with any problems, questions or concerns. No barriers to learning was detected.  I have spent a total of 20 minutes minutes of face-to-face and non-face-to-face time,  preparing to see the patient, obtaining and/or reviewing separately obtained history, performing a medically appropriate examination, counseling and educating the patient, ordering tests, documenting clinical information in the electronic health record, and care coordination (communications with other health care professionals or caregivers).    Thank you for allowing me to participate in the care of this patient.    Barrie Folk, PA-C Department of Hematology/Oncology [facility] at [facility] Phone: [phone]  [phone]    09/28/2021 4:30 PM

## 2021-09-28 NOTE — Telephone Encounter (Addendum)
Pain at old IV site. Painful knot at Left elbow o Today a second knot seen below L elbow. Both size of nickel. "The pain is trailing down my left arm ".  This is at the side  where her first IV was placed. Initially , There was bruising at this site. Now, slight redness ,denies warmth to area. Hydrocodone does not  help .   I instructed her to apply warm moist compresses today . Last Thursday pt was told to apply warm compresses-tere was only 1 knot at that time.   Mohamed recommended pt be seen in [facility] with  Verline Lema , NP .

## 2021-09-28 NOTE — Telephone Encounter (Signed)
Cassidy [name], counseling intern, called patient to schedule a counseling session.   Session scheduled for in-person, Thursday, September 21 st at noon.   Cassidy [name]  Counseling Intern

## 2021-09-29 ENCOUNTER — Ambulatory Visit
Admission: RE | Admit: 2021-09-29 | Discharge: 2021-09-29 | Disposition: A | Discharge status: Home / Self Care | Payer: Medicaid Other | Source: Ambulatory Visit | Attending: Radiation Oncology | Admitting: Radiation Oncology

## 2021-09-29 ENCOUNTER — Ambulatory Visit (HOSPITAL_COMMUNITY)
Admission: RE | Admit: 2021-09-29 | Discharge: 2021-09-29 | Disposition: A | Discharge status: Home / Self Care | Payer: Medicaid Other | Source: Ambulatory Visit | Attending: Radiation Oncology | Admitting: Radiation Oncology

## 2021-09-29 ENCOUNTER — Encounter: Payer: Self-pay | Admitting: Radiation Oncology

## 2021-09-29 VITALS — BP 103/45 | HR 87 | Temp 98.2°F | Wt 201.2 lb

## 2021-09-29 VITALS — BP 103/45 | HR 87 | Temp 98.2°F | Resp 25 | Ht 67.5 in | Wt 201.2 lb

## 2021-09-29 DIAGNOSIS — M797 Fibromyalgia: Secondary | ICD-10-CM | POA: Insufficient documentation

## 2021-09-29 DIAGNOSIS — Z79899 Other long term (current) drug therapy: Secondary | ICD-10-CM | POA: Insufficient documentation

## 2021-09-29 DIAGNOSIS — Z7951 Long term (current) use of inhaled steroids: Secondary | ICD-10-CM | POA: Diagnosis not present

## 2021-09-29 DIAGNOSIS — E785 Hyperlipidemia, unspecified: Secondary | ICD-10-CM | POA: Diagnosis not present

## 2021-09-29 DIAGNOSIS — Z800 Family history of malignant neoplasm of digestive organs: Secondary | ICD-10-CM | POA: Insufficient documentation

## 2021-09-29 DIAGNOSIS — F17210 Nicotine dependence, cigarettes, uncomplicated: Secondary | ICD-10-CM | POA: Diagnosis not present

## 2021-09-29 DIAGNOSIS — C7951 Secondary malignant neoplasm of bone: Secondary | ICD-10-CM | POA: Insufficient documentation

## 2021-09-29 DIAGNOSIS — I10 Essential (primary) hypertension: Secondary | ICD-10-CM | POA: Insufficient documentation

## 2021-09-29 DIAGNOSIS — Z7982 Long term (current) use of aspirin: Secondary | ICD-10-CM | POA: Insufficient documentation

## 2021-09-29 DIAGNOSIS — R29818 Other symptoms and signs involving the nervous system: Secondary | ICD-10-CM | POA: Diagnosis not present

## 2021-09-29 DIAGNOSIS — K219 Gastro-esophageal reflux disease without esophagitis: Secondary | ICD-10-CM | POA: Diagnosis not present

## 2021-09-29 DIAGNOSIS — C787 Secondary malignant neoplasm of liver and intrahepatic bile duct: Secondary | ICD-10-CM | POA: Insufficient documentation

## 2021-09-29 DIAGNOSIS — F319 Bipolar disorder, unspecified: Secondary | ICD-10-CM | POA: Diagnosis not present

## 2021-09-29 DIAGNOSIS — Z510 Encounter for antineoplastic radiation therapy: Secondary | ICD-10-CM | POA: Insufficient documentation

## 2021-09-29 DIAGNOSIS — E1142 Type 2 diabetes mellitus with diabetic polyneuropathy: Secondary | ICD-10-CM | POA: Insufficient documentation

## 2021-09-29 DIAGNOSIS — R079 Chest pain, unspecified: Secondary | ICD-10-CM | POA: Diagnosis not present

## 2021-09-29 DIAGNOSIS — C3411 Malignant neoplasm of upper lobe, right bronchus or lung: Secondary | ICD-10-CM | POA: Diagnosis not present

## 2021-09-29 DIAGNOSIS — D573 Sickle-cell trait: Secondary | ICD-10-CM | POA: Diagnosis not present

## 2021-09-29 DIAGNOSIS — E119 Type 2 diabetes mellitus without complications: Secondary | ICD-10-CM | POA: Diagnosis not present

## 2021-09-29 DIAGNOSIS — Z7985 Long-term (current) use of injectable non-insulin antidiabetic drugs: Secondary | ICD-10-CM | POA: Insufficient documentation

## 2021-09-29 DIAGNOSIS — C3491 Malignant neoplasm of unspecified part of right bronchus or lung: Secondary | ICD-10-CM | POA: Insufficient documentation

## 2021-09-29 DIAGNOSIS — R911 Solitary pulmonary nodule: Secondary | ICD-10-CM | POA: Diagnosis not present

## 2021-09-29 DIAGNOSIS — J449 Chronic obstructive pulmonary disease, unspecified: Secondary | ICD-10-CM | POA: Diagnosis not present

## 2021-09-29 DIAGNOSIS — Z7952 Long term (current) use of systemic steroids: Secondary | ICD-10-CM | POA: Diagnosis not present

## 2021-09-29 DIAGNOSIS — I2510 Atherosclerotic heart disease of native coronary artery without angina pectoris: Secondary | ICD-10-CM | POA: Diagnosis not present

## 2021-09-29 DIAGNOSIS — R0602 Shortness of breath: Secondary | ICD-10-CM

## 2021-09-29 MED ORDER — IOHEXOL 350 MG/ML SOLN
80.0000 mL | Freq: Once | INTRAVENOUS | Status: AC | PRN
  Administered 2021-09-29: 80 mL via INTRAVENOUS

## 2021-09-29 MED ORDER — DEXAMETHASONE 2 MG PO TABS: 2.0000 mg | ORAL_TABLET | Freq: Every day | ORAL | 0 refills | Status: DC

## 2021-09-29 NOTE — Addendum Note (Signed)
Encounter addended by: Cori Razor, RN on: 09/29/2021 1:18 PM  Actions taken: Flowsheet accepted, Clinical Note Signed

## 2021-09-29 NOTE — Progress Notes (Signed)
Notified by rapid nurse Zoe Osipenko RN that she was called to the lobby of the cancer center to see the patient.  Upon arrival she noted the patient to have SOB/increased WOB, she was satting 100% on 2L and was using her rescue inhaler.  BP was 100/65 (77), HR 96, 25 RR. Symptoms resolved within about 7 minutes, she stayed 100% on room air, and so she continued checking in for radiation.  Patient was seen in the radiation clinic following and vitals remained stable.  Patient states she believes she was having a panic attack.  She remained stable without complaint during her radiation therapy consultation.  Gloriajean Dell. Leonie Green, BSN

## 2021-09-30 ENCOUNTER — Telehealth: Payer: Self-pay

## 2021-09-30 ENCOUNTER — Telehealth: Payer: Self-pay | Admitting: Radiation Oncology

## 2021-09-30 ENCOUNTER — Inpatient Hospital Stay: Payer: Medicaid Other | Admitting: Nurse Practitioner

## 2021-09-30 ENCOUNTER — Inpatient Hospital Stay: Payer: Medicaid Other

## 2021-09-30 ENCOUNTER — Other Ambulatory Visit: Payer: Self-pay

## 2021-09-30 ENCOUNTER — Other Ambulatory Visit: Payer: Medicaid Other

## 2021-09-30 ENCOUNTER — Ambulatory Visit (HOSPITAL_COMMUNITY)
Admission: RE | Admit: 2021-09-30 | Discharge: 2021-09-30 | Disposition: A | Discharge status: Home / Self Care | Payer: Medicaid Other | Source: Ambulatory Visit | Attending: Physician Assistant | Admitting: Physician Assistant

## 2021-09-30 DIAGNOSIS — C3491 Malignant neoplasm of unspecified part of right bronchus or lung: Secondary | ICD-10-CM | POA: Insufficient documentation

## 2021-09-30 DIAGNOSIS — Z510 Encounter for antineoplastic radiation therapy: Secondary | ICD-10-CM | POA: Diagnosis not present

## 2021-09-30 LAB — CBC WITH DIFFERENTIAL (CANCER CENTER ONLY)
Abs Immature Granulocytes: 0.01 10*3/uL (ref 0.00–0.07)
Basophils Absolute: 0 10*3/uL (ref 0.0–0.1)
Basophils Relative: 1 %
Eosinophils Absolute: 0.1 10*3/uL (ref 0.0–0.5)
Eosinophils Relative: 1 %
HCT: 34 % — ABNORMAL LOW (ref 36.0–46.0)
Hemoglobin: 11.8 g/dL — ABNORMAL LOW (ref 12.0–15.0)
Immature Granulocytes: 0 %
Lymphocytes Relative: 49 %
Lymphs Abs: 2.1 10*3/uL (ref 0.7–4.0)
MCH: 28.9 pg (ref 26.0–34.0)
MCHC: 34.7 g/dL (ref 30.0–36.0)
MCV: 83.3 fL (ref 80.0–100.0)
Monocytes Absolute: 0.2 10*3/uL (ref 0.1–1.0)
Monocytes Relative: 4 %
Neutro Abs: 1.9 10*3/uL (ref 1.7–7.7)
Neutrophils Relative %: 45 %
Platelet Count: 252 10*3/uL (ref 150–400)
RBC: 4.08 MIL/uL (ref 3.87–5.11)
RDW: 14.3 % (ref 11.5–15.5)
WBC Count: 4.3 10*3/uL (ref 4.0–10.5)
nRBC: 0 % (ref 0.0–0.2)

## 2021-09-30 LAB — CMP (CANCER CENTER ONLY)
ALT: 16 U/L (ref 0–44)
AST: 13 U/L — ABNORMAL LOW (ref 15–41)
Albumin: 4.1 g/dL (ref 3.5–5.0)
Alkaline Phosphatase: 122 U/L (ref 38–126)
Anion gap: 8 (ref 5–15)
BUN: 18 mg/dL (ref 6–20)
CO2: 23 mmol/L (ref 22–32)
Calcium: 9.1 mg/dL (ref 8.9–10.3)
Chloride: 106 mmol/L (ref 98–111)
Creatinine: 0.57 mg/dL (ref 0.44–1.00)
GFR, Estimated: >60 mL/min (ref >60)
Glucose, Bld: 237 mg/dL — ABNORMAL HIGH (ref 70–99)
Potassium: 4.2 mmol/L (ref 3.5–5.1)
Sodium: 137 mmol/L (ref 135–145)
Total Bilirubin: 0.7 mg/dL (ref 0.3–1.2)
Total Protein: 7.5 g/dL (ref 6.5–8.1)

## 2021-09-30 NOTE — Telephone Encounter (Signed)
I called to follow up that the patient had heard results of her CT results and that her tumor has started to shrink and that we didn't need to treat her chest, and to confirm that she knew she did not have any blood clots on her scan. We will proceed with her treatment tomorrow.

## 2021-09-30 NOTE — Progress Notes (Signed)
Left upper extremity venous duplex has been completed. Preliminary results can be found in CV Proc through chart review.  Results were given to [facility] PA.  09/30/21 1:24 PM Carlos Levering RVT

## 2021-09-30 NOTE — Telephone Encounter (Signed)
Called pt to confirm canceled appt, no further needs at this time.

## 2021-09-30 NOTE — Telephone Encounter (Signed)
A from was received from the front office that pt dropped off. The form is for dental clearance for restorative, extractions, root canal therapy and local anesthetic (with epinephrine).  I have called the pt because the form she has provided is already signed in the "Physician Signature" section. I advised the pt in my voicemail of this and that she would need to bring an unsigned form for Dr. Julien Nordmann to complete.

## 2021-10-01 ENCOUNTER — Other Ambulatory Visit: Payer: Self-pay

## 2021-10-01 ENCOUNTER — Ambulatory Visit
Admission: RE | Admit: 2021-10-01 | Discharge: 2021-10-01 | Disposition: A | Discharge status: Home / Self Care | Payer: Medicaid Other | Source: Ambulatory Visit | Attending: Radiation Oncology | Admitting: Radiation Oncology

## 2021-10-01 DIAGNOSIS — Z510 Encounter for antineoplastic radiation therapy: Secondary | ICD-10-CM | POA: Diagnosis not present

## 2021-10-01 LAB — RAD ONC ARIA SESSION SUMMARY
Course Elapsed Days: 0
Plan Fractions Treated to Date: 1
Plan Prescribed Dose Per Fraction: 2.5 Gy
Plan Total Fractions Prescribed: 15
Plan Total Prescribed Dose: 37.5 Gy
Reference Point Dosage Given to Date: 2.5 Gy
Reference Point Session Dosage Given: 2.5 Gy
Session Number: 1

## 2021-10-01 NOTE — Progress Notes (Signed)
Cassidy [name], counseling intern, met with patient for intake counseling session.   Counselor and patient spoke about the patient's support system, the patient's relationship with her son, and the patient's familial history.   The patient scheduled next appointment for Monday, September 25th at [place]  Counseling Intern

## 2021-10-02 ENCOUNTER — Other Ambulatory Visit: Payer: Self-pay

## 2021-10-02 ENCOUNTER — Ambulatory Visit
Admission: RE | Admit: 2021-10-02 | Discharge: 2021-10-02 | Disposition: A | Discharge status: Home / Self Care | Payer: Medicaid Other | Source: Ambulatory Visit | Attending: Radiation Oncology | Admitting: Radiation Oncology

## 2021-10-02 DIAGNOSIS — C3491 Malignant neoplasm of unspecified part of right bronchus or lung: Secondary | ICD-10-CM

## 2021-10-02 DIAGNOSIS — Z510 Encounter for antineoplastic radiation therapy: Secondary | ICD-10-CM | POA: Diagnosis not present

## 2021-10-02 LAB — RAD ONC ARIA SESSION SUMMARY
Course Elapsed Days: 1
Plan Fractions Treated to Date: 2
Plan Prescribed Dose Per Fraction: 2.5 Gy
Plan Total Fractions Prescribed: 15
Plan Total Prescribed Dose: 37.5 Gy
Reference Point Dosage Given to Date: 5 Gy
Reference Point Session Dosage Given: 2.5 Gy
Session Number: 2

## 2021-10-02 MED ORDER — SONAFINE EX EMUL
1.0000 | Freq: Two times a day (BID) | CUTANEOUS | Status: DC
  Administered 2021-10-02: 1 via TOPICAL

## 2021-10-05 ENCOUNTER — Other Ambulatory Visit: Payer: Self-pay

## 2021-10-05 ENCOUNTER — Ambulatory Visit
Admission: RE | Admit: 2021-10-05 | Discharge: 2021-10-05 | Disposition: A | Discharge status: Home / Self Care | Payer: Medicaid Other | Source: Ambulatory Visit | Attending: Radiation Oncology | Admitting: Radiation Oncology

## 2021-10-05 DIAGNOSIS — Z510 Encounter for antineoplastic radiation therapy: Secondary | ICD-10-CM | POA: Diagnosis not present

## 2021-10-05 LAB — RAD ONC ARIA SESSION SUMMARY
Course Elapsed Days: 4
Plan Fractions Treated to Date: 3
Plan Prescribed Dose Per Fraction: 2.5 Gy
Plan Total Fractions Prescribed: 15
Plan Total Prescribed Dose: 37.5 Gy
Reference Point Dosage Given to Date: 7.5 Gy
Reference Point Session Dosage Given: 2.5 Gy
Session Number: 3

## 2021-10-05 NOTE — Progress Notes (Signed)
Cassidy [name], counseling intern, led patient's second counseling session.   Patient expressed they had a good weekend after spending time with their brother, their son, and their son's girlfriend.   The patient shared moments in their past when they did not have a good boundaries. This lack of boundaries led the patient to feeling of frustration and acts of anger. The patient expressed, through meditation and prayer, they have learned to develop boundaries and manager their anger.   Patient scheduled their next counseling session for Monday, October 2nd at [place]  Counseling Intern

## 2021-10-06 ENCOUNTER — Telehealth: Payer: Self-pay | Admitting: Medical Oncology

## 2021-10-06 ENCOUNTER — Ambulatory Visit
Admission: RE | Admit: 2021-10-06 | Discharge: 2021-10-06 | Disposition: A | Discharge status: Home / Self Care | Payer: Medicaid Other | Source: Ambulatory Visit | Attending: Radiation Oncology | Admitting: Radiation Oncology

## 2021-10-06 ENCOUNTER — Other Ambulatory Visit: Payer: Self-pay

## 2021-10-06 DIAGNOSIS — Z510 Encounter for antineoplastic radiation therapy: Secondary | ICD-10-CM | POA: Diagnosis not present

## 2021-10-06 LAB — RAD ONC ARIA SESSION SUMMARY
Course Elapsed Days: 5
Plan Fractions Treated to Date: 4
Plan Prescribed Dose Per Fraction: 2.5 Gy
Plan Total Fractions Prescribed: 15
Plan Total Prescribed Dose: 37.5 Gy
Reference Point Dosage Given to Date: 10 Gy
Reference Point Session Dosage Given: 2.5 Gy
Session Number: 4

## 2021-10-06 NOTE — Telephone Encounter (Signed)
err

## 2021-10-07 ENCOUNTER — Inpatient Hospital Stay: Payer: Medicaid Other

## 2021-10-07 ENCOUNTER — Ambulatory Visit
Admission: RE | Admit: 2021-10-07 | Discharge: 2021-10-07 | Disposition: A | Discharge status: Home / Self Care | Payer: Medicaid Other | Source: Ambulatory Visit | Attending: Radiation Oncology | Admitting: Radiation Oncology

## 2021-10-07 ENCOUNTER — Other Ambulatory Visit: Payer: Self-pay

## 2021-10-07 DIAGNOSIS — Z510 Encounter for antineoplastic radiation therapy: Secondary | ICD-10-CM | POA: Diagnosis not present

## 2021-10-07 DIAGNOSIS — C3491 Malignant neoplasm of unspecified part of right bronchus or lung: Secondary | ICD-10-CM

## 2021-10-07 LAB — CBC WITH DIFFERENTIAL (CANCER CENTER ONLY)
Abs Immature Granulocytes: 0.01 10*3/uL (ref 0.00–0.07)
Basophils Absolute: 0 10*3/uL (ref 0.0–0.1)
Basophils Relative: 0 %
Eosinophils Absolute: 0.1 10*3/uL (ref 0.0–0.5)
Eosinophils Relative: 3 %
HCT: 33.3 % — ABNORMAL LOW (ref 36.0–46.0)
Hemoglobin: 11.7 g/dL — ABNORMAL LOW (ref 12.0–15.0)
Immature Granulocytes: 0 %
Lymphocytes Relative: 33 %
Lymphs Abs: 1 10*3/uL (ref 0.7–4.0)
MCH: 29.2 pg (ref 26.0–34.0)
MCHC: 35.1 g/dL (ref 30.0–36.0)
MCV: 83 fL (ref 80.0–100.0)
Monocytes Absolute: 0.4 10*3/uL (ref 0.1–1.0)
Monocytes Relative: 11 %
Neutro Abs: 1.6 10*3/uL — ABNORMAL LOW (ref 1.7–7.7)
Neutrophils Relative %: 53 %
Platelet Count: 188 10*3/uL (ref 150–400)
RBC: 4.01 MIL/uL (ref 3.87–5.11)
RDW: 14.7 % (ref 11.5–15.5)
WBC Count: 3.1 10*3/uL — ABNORMAL LOW (ref 4.0–10.5)
nRBC: 0 % (ref 0.0–0.2)

## 2021-10-07 LAB — CMP (CANCER CENTER ONLY)
ALT: 16 U/L (ref 0–44)
AST: 16 U/L (ref 15–41)
Albumin: 3.6 g/dL (ref 3.5–5.0)
Alkaline Phosphatase: 111 U/L (ref 38–126)
Anion gap: 8 (ref 5–15)
BUN: 13 mg/dL (ref 6–20)
CO2: 23 mmol/L (ref 22–32)
Calcium: 8.6 mg/dL — ABNORMAL LOW (ref 8.9–10.3)
Chloride: 108 mmol/L (ref 98–111)
Creatinine: 0.65 mg/dL (ref 0.44–1.00)
GFR, Estimated: >60 mL/min (ref >60)
Glucose, Bld: 289 mg/dL — ABNORMAL HIGH (ref 70–99)
Potassium: 3.9 mmol/L (ref 3.5–5.1)
Sodium: 139 mmol/L (ref 135–145)
Total Bilirubin: 0.6 mg/dL (ref 0.3–1.2)
Total Protein: 6.6 g/dL (ref 6.5–8.1)

## 2021-10-07 LAB — RAD ONC ARIA SESSION SUMMARY
Course Elapsed Days: 6
Plan Fractions Treated to Date: 5
Plan Prescribed Dose Per Fraction: 2.5 Gy
Plan Total Fractions Prescribed: 15
Plan Total Prescribed Dose: 37.5 Gy
Reference Point Dosage Given to Date: 12.5 Gy
Reference Point Session Dosage Given: 2.5 Gy
Session Number: 5

## 2021-10-08 ENCOUNTER — Other Ambulatory Visit: Payer: Self-pay

## 2021-10-08 ENCOUNTER — Ambulatory Visit
Admission: RE | Admit: 2021-10-08 | Discharge: 2021-10-08 | Disposition: A | Discharge status: Home / Self Care | Payer: Medicaid Other | Source: Ambulatory Visit | Attending: Radiation Oncology | Admitting: Radiation Oncology

## 2021-10-08 DIAGNOSIS — Z510 Encounter for antineoplastic radiation therapy: Secondary | ICD-10-CM | POA: Diagnosis not present

## 2021-10-08 LAB — RAD ONC ARIA SESSION SUMMARY
Course Elapsed Days: 7
Plan Fractions Treated to Date: 6
Plan Prescribed Dose Per Fraction: 2.5 Gy
Plan Total Fractions Prescribed: 15
Plan Total Prescribed Dose: 37.5 Gy
Reference Point Dosage Given to Date: 15 Gy
Reference Point Session Dosage Given: 2.5 Gy
Session Number: 6

## 2021-10-09 ENCOUNTER — Ambulatory Visit
Admission: RE | Admit: 2021-10-09 | Discharge: 2021-10-09 | Disposition: A | Discharge status: Home / Self Care | Payer: Medicaid Other | Source: Ambulatory Visit | Attending: Radiation Oncology | Admitting: Radiation Oncology

## 2021-10-09 ENCOUNTER — Other Ambulatory Visit: Payer: Self-pay

## 2021-10-09 ENCOUNTER — Other Ambulatory Visit: Payer: Self-pay | Admitting: Radiation Oncology

## 2021-10-09 DIAGNOSIS — Z510 Encounter for antineoplastic radiation therapy: Secondary | ICD-10-CM | POA: Diagnosis not present

## 2021-10-09 LAB — RAD ONC ARIA SESSION SUMMARY
Course Elapsed Days: 8
Plan Fractions Treated to Date: 7
Plan Prescribed Dose Per Fraction: 2.5 Gy
Plan Total Fractions Prescribed: 15
Plan Total Prescribed Dose: 37.5 Gy
Reference Point Dosage Given to Date: 17.5 Gy
Reference Point Session Dosage Given: 2.3981 Gy
Session Number: 7

## 2021-10-09 MED ORDER — OXYCODONE-ACETAMINOPHEN 5-325 MG PO TABS: 1.0000 | ORAL_TABLET | Freq: Four times a day (QID) | ORAL | 0 refills | Status: DC | PRN

## 2021-10-12 ENCOUNTER — Other Ambulatory Visit: Payer: Self-pay

## 2021-10-12 ENCOUNTER — Ambulatory Visit
Admission: RE | Admit: 2021-10-12 | Discharge: 2021-10-12 | Disposition: A | Discharge status: Home / Self Care | Payer: Medicaid Other | Source: Ambulatory Visit | Attending: Radiation Oncology | Admitting: Radiation Oncology

## 2021-10-12 DIAGNOSIS — C7951 Secondary malignant neoplasm of bone: Secondary | ICD-10-CM | POA: Diagnosis present

## 2021-10-12 DIAGNOSIS — C3491 Malignant neoplasm of unspecified part of right bronchus or lung: Secondary | ICD-10-CM | POA: Diagnosis not present

## 2021-10-12 DIAGNOSIS — Z510 Encounter for antineoplastic radiation therapy: Secondary | ICD-10-CM | POA: Diagnosis present

## 2021-10-12 LAB — RAD ONC ARIA SESSION SUMMARY
Course Elapsed Days: 11
Plan Fractions Treated to Date: 8
Plan Prescribed Dose Per Fraction: 2.5 Gy
Plan Total Fractions Prescribed: 15
Plan Total Prescribed Dose: 37.5 Gy
Reference Point Dosage Given to Date: 20 Gy
Reference Point Session Dosage Given: 2.5 Gy
Session Number: 8

## 2021-10-12 NOTE — Progress Notes (Signed)
Cassidy [name], counseling intern, met with patient for their third counseling session.   Patient entered counseling feeling tired and weepy. Patient spoke about how nothing was going to plan today. The patient expressed they are beginning to feel overwhelmed with all of the appointments related to their cancer treatment.   The patient and counselor distinguished the difference between resting all day because of exhaustion due to treatment versus staying in bed all day as a result of feeling depressed.   On the first meeting, the patient mentioned to the counselor that they spend time in the park and take pictures of clouds. The counselor brought the patient a book of [employer] clouds to page through. After crying the first half of the session, the patient expressed that paging through the book shifted their mood. By the end of the session the patient expressed they felt better.   The patient entered the session speaking quietly and walking slowly and left the session smiling.    The counselor informed the patient about the programs at [facility]. The counselor gave the patient the [facility] schedule for the month of October.    The patient's next counseling session is scheduled for Thursday, October 12th at [place],  Counseling Intern

## 2021-10-12 NOTE — Progress Notes (Unsigned)
[facility] OFFICE PROGRESS NOTE  Curt Bears, MD [address] [place] [address]  DIAGNOSIS:  Stage IV (T2a, N2, M1c) non-small cell lung cancer, adenocarcinoma presented with right upper lobe lung mass in addition to right hilar and mediastinal lymphadenopathy and innumerable bilateral pulmonary nodules as well as liver and bone metastasis diagnosed in August 2023.    Detected Alteration(s) / Biomarker(s) Associated FDA-approved therapies  Clinical Trial Availability      % cfDNA or Amplification   KRAS G12C  approved by FDA Adagrasib, Sotorasib Yes   5.3%   IDH1 R132L  approved in other indication Ivosidenib, Olutasidenib Yes      1.9%   PD-L1 expression 89%  PRIOR THERAPY: None  CURRENT THERAPY:   1) Systemic chemotherapy with carboplatin for AUC of 5, Alimta 500 Mg/M2 and Keytruda 200 Mg IV every 3 weeks.  First dose September 02, 2021.  Status post 2 cycles.  2) palliative radiation to the painful metastatic bone lesions at L4 and the sacrum ***double check***under the care of Dr. Lisbeth Renshaw.  INTERVAL HISTORY: Cassidy [name] 56 y.o. female returns to the clinic today for follow-up visit.  The patient is currently undergoing palliative systemic chemotherapy.  She is tolerating treatment fairly well except for***.  From her metastatic bone lesions, she has some pain particularly in the***.  She is prescribed Norco every 8 hours by her PCP.  The patient recently had a refill and was concerned at her last appointment about switching her pain regimen due to insurance authorization.  She is scheduled to see Lexine Baton from the palliative care team on 10/16/2021.  She is also currently undergoing palliative radiation to the met the static bone lesions which has ***her pain.  She is also following closely with the counselor.  In the interval since last being seen, she was seen in the symptom management clinic on 09/28/2021 for arm pain.  She was found to have a  superficial thrombus in the accessory vein in the lateral proximal forearm.  This is in an area with her prior IV site.  He also was endorsing chest pain and had a CT angiogram which showed decrease in size of the primary right upper lobe lung lesion.  There is also improving mediastinal adenopathy and pulmonary metastatic lesions compared to prior exam.   Otherwise since last being seen she denies any fever, chills, night sweats, or unexplained weight loss.  Breathing?  She has some dyspnea on exertion at baseline.  Denies any unusual cough, hemoptysis, or chest pain.  Denies any nausea, vomiting, diarrhea, or constipation denies any headache or visual changes.  Denies any rashes or skin changes.  The patient recently saw her dentist and is requesting dental clearance for root canal and local anesthetic.  The patient is here today for evaluation and repeat blood work before undergoing cycle #3.     MEDICAL HISTORY: Past Medical History:  Diagnosis Date   Adenocarcinoma of right lung, stage 4 ([facility]) 08/26/2021   Anemia    has sickle cell trait   Atherosclerotic heart disease of native coronary artery with angina pectoris ([facility]) 05/2012, 10/2013   a.  s/p PCTA to dRCA and ostial RPAV-PLA vessel + PDA branch (05/2012)  b. [place] s/p DES to mLAD - Resolute DES 3.0 x 22 (3.33m -->3.3 mm)   Bipolar depression ([facility]    Breast abscess    a. right side.    COPD (chronic obstructive pulmonary disease) ([facility]    Depression  Diabetic peripheral neuropathy (HCC)    Fibromyalgia    Genital warts    GERD (gastroesophageal reflux disease)    History of hiatal hernia    HLD (hyperlipidemia)    Hypertension    Pain in limb    a. LE VENOUS DUPLEX, 02/05/2009 - no evidence of deep vein thrombosis, Baker's cyst   Pneumonia    PTSD (post-traumatic stress disorder)    Sickle cell trait (HCC)    Tobacco abuse    Tooth caries    Type II diabetes mellitus (HCC)     ALLERGIES:  is allergic to amoxil  [amoxicillin], chantix [varenicline], suboxone [buprenorphine hcl-naloxone hcl], and emend [fosaprepitant dimeglumine].  MEDICATIONS:  Current Outpatient Medications  Medication Sig Dispense Refill   ACCU-CHEK AVIVA PLUS test strip TEST BLOOD SUGAR THREE TIMES DAILY AS DIRECTED 50 each 1   amLODipine (NORVASC) 5 MG tablet Take 1 tablet (5 mg total) by mouth at bedtime. 90 tablet 3   aspirin EC 81 MG tablet Take 81 mg by mouth daily.     atorvastatin (LIPITOR) 40 MG tablet TAKE 1 TABLET (40 MG TOTAL) BY MOUTH DAILY. 90 tablet 3   busPIRone (BUSPAR) 15 MG tablet TAKE 1/2 TABLET (7.5 MG TOTAL) BY MOUTH TWICE DAILY (Patient taking differently: Take 7.5 mg by mouth 2 (two) times daily.) 30 tablet 11   Carboxymethylcellul-Glycerin (LUBRICATING EYE DROPS OP) Place 1 drop into both eyes daily as needed (dry eyes).     carvedilol (COREG) 12.5 MG tablet Take 1 tablet (12.5 mg total) by mouth 2 (two) times daily with a meal. 180 tablet 1   dexamethasone (DECADRON) 2 MG tablet Take 1 tablet (2 mg total) by mouth daily. 30 tablet 0   diclofenac Sodium (VOLTAREN) 1 % GEL Apply 1 application topically 3 times daily as needed for pain 2 g 3   diphenhydramine-acetaminophen (TYLENOL PM) 25-500 MG TABS tablet Take 1 tablet by mouth at bedtime as needed (sleep). (Patient not taking: Reported on 09/07/2021)     Dulaglutide (TRULICITY) 8.85 OY/7.7AJ SOPN Inject 0.75 mg into the skin once a week. (Patient not taking: Reported on 09/29/2021) 2 mL 0   ferrous sulfate 325 (65 FE) MG tablet Take 325 mg by mouth daily with breakfast.     fluticasone (FLONASE) 50 MCG/ACT nasal spray PLACE 1 SPRAY INTO BOTH NOSTRILS DAILY AS NEEDED (CONGESTION). 16 g 1   fluticasone-salmeterol (ADVAIR) 100-50 MCG/ACT AEPB Inhale 1 puff into the lungs 2 (two) times daily. 1 each 3   folic acid (FOLVITE) 1 MG tablet Take 1 tablet (1 mg total) by mouth daily. 30 tablet 3   furosemide (LASIX) 40 MG tablet Take 1 tablet (40 mg total) by mouth daily  as needed. If you gain 3 lbs in 24 hours or 5 lbs in 1 week (Patient not taking: Reported on 09/07/2021) 30 tablet 1   ipratropium (ATROVENT HFA) 17 MCG/ACT inhaler Inhale 2 puffs into the lungs every 4 (four) hours as needed for wheezing (COPD).     isosorbide mononitrate (IMDUR) 30 MG 24 hr tablet Take 30 mg by mouth daily.     LINZESS 290 MCG CAPS capsule TAKE 1 CAPSULE (290 MCG TOTAL) BY MOUTH DAILY BEFORE BREAKFAST. (Patient not taking: Reported on 09/07/2021) 30 capsule 3   losartan (COZAAR) 50 MG tablet Take 50 mg by mouth daily. (Patient not taking: Reported on 09/07/2021)     methocarbamol (ROBAXIN) 500 MG tablet Take 1 tablet (500 mg total) by mouth every 8 (eight)  hours as needed for muscle spasms. (Patient not taking: Reported on 09/07/2021) 20 tablet 0   nicotine polacrilex (NICORETTE) 2 MG gum Take 1 each (2 mg total) by mouth as needed for smoking cessation. 100 tablet 0   nitroGLYCERIN (NITROSTAT) 0.4 MG SL tablet Place 1 tablet (0.4 mg total) under the tongue every 5 (five) minutes as needed for chest pain. 25 tablet 5   nystatin (MYCOSTATIN) 100000 UNIT/ML suspension Take 5 mLs (500,000 Units total) by mouth 4 (four) times daily. 60 mL 0   oxyCODONE-acetaminophen (PERCOCET) 5-325 MG tablet Take 1-2 tablets by mouth every 6 (six) hours as needed for severe pain. 60 tablet 0   pantoprazole (PROTONIX) 40 MG tablet TAKE 1 TABLET (40 MG TOTAL) BY MOUTH DAILY. 30 tablet 1   potassium chloride SA (KLOR-CON M) 20 MEQ tablet Take 1 tablet (20 mEq total) by mouth daily as needed. On days you take Lasix (Patient not taking: Reported on 09/07/2021) 30 tablet 1   prochlorperazine (COMPAZINE) 10 MG tablet Take 1 tablet (10 mg total) by mouth every 6 (six) hours as needed for nausea or vomiting. 30 tablet 0   sertraline (ZOLOFT) 50 MG tablet TAKE 1 TABLET (50 MG TOTAL) BY MOUTH DAILY. 30 tablet 3   valACYclovir (VALTREX) 1000 MG tablet TAKE FOR 3 DAYS AS NEEDED FOR EACH OUTBREAK 30 tablet 11   vitamin  B-12 (CYANOCOBALAMIN) 1000 MCG tablet Take 1,000 mcg by mouth daily.     No current facility-administered medications for this visit.    SURGICAL HISTORY:  Past Surgical History:  Procedure Laterality Date   BLADDER SURGERY  1980   "TVT"   BLADDER SURGERY  2003   "TVT"   BREAST EXCISIONAL BIOPSY     BREAST SURGERY Right    I&D for multiple abscesses   CARDIAC CATHETERIZATION     CORONARY ARTERY BYPASS GRAFT N/A 06/30/2021   Procedure: CORONARY ARTERY BYPASS GRAFTING (CABG) x3 USING LEFT INTERNAL MAMMARY ARTERY,  RIGHT RADIAL ARTERY AND LEFT GREATER SAPHENOUS VEIN;  Surgeon: Lajuana Matte, MD;  Location: [facility];  Service: Open Heart Surgery;  Laterality: N/A;   cutting balloon     ENDOVEIN HARVEST OF GREATER SAPHENOUS VEIN Left 06/30/2021   Procedure: ENDOVEIN HARVEST OF GREATER SAPHENOUS VEIN;  Surgeon: Lajuana Matte, MD;  Location: [facility];  Service: Open Heart Surgery;  Laterality: Left;   EYE SURGERY Left    laser surgery   FINE NEEDLE ASPIRATION  08/21/2021   Procedure: FINE NEEDLE ASPIRATION (FNA) LINEAR;  Surgeon: Garner Nash, DO;  Location: [facility];  Service: Pulmonary;;   INCISE AND DRAIN ABCESS  ; 04/26/11; 08/13/11   right breast   INCISION AND DRAINAGE ABSCESS Right 05/09/2012   Procedure: INCISION AND DRAINAGE RIGHT BREAST ABSCESS;  Surgeon: Imogene Burn. Georgette Dover, MD;  Location: [facility];  Service: General;  Laterality: Right;   INCISION AND DRAINAGE ABSCESS Right 02/08/2014   Procedure: INCISION AND DRAINAGE RIGHT BREAST ABSCESS;  Surgeon: Donnie Mesa, MD;  Location: [facility];  Service: General;  Laterality: Right;   INCISION AND DRAINAGE ABSCESS Right 06/14/2014   Procedure: INCISION AND DRAINAGE RIGHT BREAST ABSCESS;  Surgeon: Donnie Mesa, MD;  Location: [facility];  Service: General;  Laterality: Right;   IRRIGATION AND DEBRIDEMENT ABSCESS  12/19/2010   Procedure: IRRIGATION AND DEBRIDEMENT ABSCESS;  Surgeon: Rolm Bookbinder, MD;  Location: [facility];  Service:  General;  Laterality: Right;   IRRIGATION AND DEBRIDEMENT ABSCESS  08/13/2011   Procedure: MINOR  INCISION AND DRAINAGE OF ABSCESS;  Surgeon: Gwenyth Ober, MD;  Location: [facility];  Service: General;  Laterality: Right;  Right Breast    IRRIGATION AND DEBRIDEMENT ABSCESS  11/17/2011   Procedure: IRRIGATION AND DEBRIDEMENT ABSCESS;  Surgeon: Imogene Burn. Georgette Dover, MD;  Location: [facility];  Service: General;  Laterality: Right;  irrigation and debridement right recurrent breast abscess   LARYNX SURGERY     LEFT HEART CATH AND CORONARY ANGIOGRAPHY N/A 04/09/2021   Procedure: LEFT HEART CATH AND CORONARY ANGIOGRAPHY;  Surgeon: Jettie Booze, MD;  Location: [facility];  Service: Cardiovascular;  Laterality: N/A;   LEFT HEART CATHETERIZATION WITH CORONARY ANGIOGRAM N/A 05/29/2012   Procedure: LEFT HEART CATHETERIZATION WITH CORONARY ANGIOGRAM & PTCA;  Surgeon: Troy Sine, MD;  [facility];  Service: Cardiovascular; Bifurcation dRCA-RPAD/PLA & rPDA  PTCA.   LEFT HEART CATHETERIZATION WITH CORONARY ANGIOGRAM N/A 11/08/2013   Procedure: LEFT HEART CATHETERIZATION WITH CORONARY ANGIOGRAM and Coronary Stent Intervention;  Surgeon: Leonie Man, MD;  Location: [facility];  Service: Cardiovascular; mLAD 80% (FFR 0.73) - resolute DES 3.0 x 22 mm (postdilated to 3.3 mm->3.5 mm)   NM MYOVIEW LTD  01/26/2009   Normal study, no evidence of ischemia, EF 67%   ployp removed from voice box 03/30/12     RADIAL ARTERY HARVEST Right 06/30/2021   Procedure: RADIAL ARTERY HARVEST;  Surgeon: Lajuana Matte, MD;  Location: [facility];  Service: Open Heart Surgery;  Laterality: Right;   REFRACTIVE SURGERY  ~ 2010   right   TEE WITHOUT CARDIOVERSION N/A 06/30/2021   Procedure: TRANSESOPHAGEAL ECHOCARDIOGRAM (TEE);  Surgeon: Lajuana Matte, MD;  Location: [facility];  Service: Open Heart Surgery;  Laterality: N/A;   TONSILLECTOMY  1988   VIDEO BRONCHOSCOPY WITH ENDOBRONCHIAL ULTRASOUND Bilateral  08/21/2021   Procedure: VIDEO BRONCHOSCOPY WITH ENDOBRONCHIAL ULTRASOUND;  Surgeon: Garner Nash, DO;  Location: [facility];  Service: Pulmonary;  Laterality: Bilateral;    REVIEW OF SYSTEMS:   Review of Systems  Constitutional: Negative for appetite change, chills, fatigue, fever and unexpected weight change.  HENT:   Negative for mouth sores, nosebleeds, sore throat and trouble swallowing.   Eyes: Negative for eye problems and icterus.  Respiratory: Negative for cough, hemoptysis, shortness of breath and wheezing.   Cardiovascular: Negative for chest pain and leg swelling.  Gastrointestinal: Negative for abdominal pain, constipation, diarrhea, nausea and vomiting.  Genitourinary: Negative for bladder incontinence, difficulty urinating, dysuria, frequency and hematuria.   Musculoskeletal: Negative for back pain, gait problem, neck pain and neck stiffness.  Skin: Negative for itching and rash.  Neurological: Negative for dizziness, extremity weakness, gait problem, headaches, light-headedness and seizures.  Hematological: Negative for adenopathy. Does not bruise/bleed easily.  Psychiatric/Behavioral: Negative for confusion, depression and sleep disturbance. The patient is not nervous/anxious.     PHYSICAL EXAMINATION:  Last menstrual period 06/12/2016.  ECOG PERFORMANCE STATUS: {CHL ONC ECOG [id number]  Physical Exam  Constitutional: Oriented to person, place, and time and well-developed, well-nourished, and in no distress. No distress.  HENT:  Head: Normocephalic and atraumatic.  Mouth/Throat: Oropharynx is clear and moist. No oropharyngeal exudate.  Eyes: Conjunctivae are normal. Right eye exhibits no discharge. Left eye exhibits no discharge. No scleral icterus.  Neck: Normal range of motion. Neck supple.  Cardiovascular: Normal rate, regular rhythm, normal heart sounds and intact distal pulses.   Pulmonary/Chest: Effort normal and breath sounds normal. No respiratory  distress. No wheezes. No rales.  Abdominal: Soft. Bowel sounds are normal. Exhibits no distension and no mass. There is no tenderness.  Musculoskeletal: Normal range of motion. Exhibits no edema.  Lymphadenopathy:    No cervical adenopathy.  Neurological: Alert and oriented to person, place, and time. Exhibits normal muscle tone. Gait normal. Coordination normal.  Skin: Skin is warm and dry. No rash noted. Not diaphoretic. No erythema. No pallor.  Psychiatric: Mood, memory and judgment normal.  Vitals reviewed.  LABORATORY DATA: Lab Results  Component Value Date   WBC 3.1 (L) 10/07/2021   HGB 11.7 (L) 10/07/2021   HCT 33.3 (L) 10/07/2021   MCV 83.0 10/07/2021   PLT 188 10/07/2021      Chemistry      Component Value Date/Time   NA 139 10/07/2021 1317   NA 140 04/03/2021 1059   K 3.9 10/07/2021 1317   CL 108 10/07/2021 1317   CO2 23 10/07/2021 1317   BUN 13 10/07/2021 1317   BUN 13 04/03/2021 1059   CREATININE 0.65 10/07/2021 1317   CREATININE 0.65 11/06/2013 1520      Component Value Date/Time   CALCIUM 8.6 (L) 10/07/2021 1317   ALKPHOS 111 10/07/2021 1317   AST 16 10/07/2021 1317   ALT 16 10/07/2021 1317   BILITOT 0.6 10/07/2021 1317       RADIOGRAPHIC STUDIES:  VAS [place] UPPER EXTREMITY VENOUS DUPLEX  Result Date: 09/30/2021 UPPER VENOUS STUDY  Patient Name:  ANGELETTE GANUS  Date of Exam:   09/30/2021 Medical Rec #: [id number]        Accession #:    [id number] Date of Birth: August 27, 1965       Patient Gender: F Patient Age:   40 years Exam [facility]  [facility] Procedure:      VAS [place] UPPER EXTREMITY VENOUS DUPLEX Referring Phys: Sherol Dade --------------------------------------------------------------------------------  Indications: Pain Risk Factors: Cancer. Comparison Study: No prior studies. Performing Technologist: Oliver Hum RVT  Examination Guidelines: A complete evaluation includes B-mode imaging, spectral Doppler, color Doppler, and power  Doppler as needed of all accessible portions of each vessel. Bilateral testing is considered an integral part of a complete examination. Limited examinations for reoccurring indications may be performed as noted.  Right Findings: +----------+------------+---------+-----------+----------+-------+ RIGHT     CompressiblePhasicitySpontaneousPropertiesSummary +----------+------------+---------+-----------+----------+-------+ Subclavian    Full       Yes       Yes                      +----------+------------+---------+-----------+----------+-------+  Left Findings: +----------+------------+---------+-----------+----------+-------+ LEFT      CompressiblePhasicitySpontaneousPropertiesSummary +----------+------------+---------+-----------+----------+-------+ IJV           Full       Yes       Yes                      +----------+------------+---------+-----------+----------+-------+ Subclavian    Full       Yes       Yes                      +----------+------------+---------+-----------+----------+-------+ Axillary      Full       Yes       Yes                      +----------+------------+---------+-----------+----------+-------+ Brachial      Full       Yes       Yes                      +----------+------------+---------+-----------+----------+-------+  Radial        Full                                          +----------+------------+---------+-----------+----------+-------+ Ulnar         Full                                          +----------+------------+---------+-----------+----------+-------+ Cephalic      Full                                          +----------+------------+---------+-----------+----------+-------+ Basilic       Full                                          +----------+------------+---------+-----------+----------+-------+ Superficial thrombus noted in an accessory vein located in the lateral, proximal forearm.   Summary:  Right: No evidence of thrombosis in the subclavian.  Left: No evidence of deep vein thrombosis in the upper extremity. Superficial thrombus noted in an accessory vein located in the lateral, proximal forearm.  *See table(s) above for measurements and observations.  Diagnosing physician: Jamelle Haring Electronically signed by Jamelle Haring on 09/30/2021 at 4:24:59 PM.    Final    MR Brain W Wo Contrast  Result Date: 09/30/2021 CLINICAL DATA:  Non-small cell lung cancer staging EXAM: MRI HEAD WITHOUT AND WITH CONTRAST TECHNIQUE: Multiplanar, multiecho pulse sequences of the brain and surrounding structures were obtained without and with intravenous contrast. CONTRAST:  31m GADAVIST GADOBUTROL 1 MMOL/ML IV SOLN COMPARISON:  None similar FINDINGS: Brain: Possible early punctate metastasis in the left frontal white matter on 30:82, only 1-2 mm. There is a punctate area of nonenhancing diffusion hyperintensity and FLAIR hyperintensity in the subcortical white matter of the anterior right frontal lobe, see 24:32. No significant white matter disease. Brain volume is normal. No hydrocephalus or collection Vascular: Major flow voids are enhanced Skull and upper cervical spine: 3 enhancing calvarial lesions seen in the bilateral parietal bone and in the upper midline occipital bone. The latter is the largest at 17 mm and erodes through the inner table without dural thickening. This metastasis is at the level of the superior sagittal sinus. The left parietal metastasis is also associated with the inner table. Sinuses/Orbits: Negative IMPRESSION: 1. Possible punctate left frontal white matter metastasis but subtle poorly resolved vessel is not excluded, consider short-term follow-up with 3T. 2. At follow-up attention to an area of nonenhancing diffusion hyperintensity in the right frontal white matter which could be a subacute white matter infarct, but there is notable lack of background white matter disease. 3.  Three calvarial metastases, the largest in the upper occipital bone measuring 17 mm and eroding the inner table at the level of the superior sagittal sinus. A left parietal metastasis is smaller but also involves the inner table. Electronically Signed   By: JJorje GuildM.D.   On: 09/30/2021 10:51   CT Angio Chest Pulmonary Embolism (PE) W or WO Contrast  Result Date: 09/29/2021 CLINICAL DATA:  Chest pain. EXAM: CT ANGIOGRAPHY CHEST WITH CONTRAST TECHNIQUE:  Multidetector CT imaging of the chest was performed using the standard protocol during bolus administration of intravenous contrast. Multiplanar CT image reconstructions and MIPs were obtained to evaluate the vascular anatomy. RADIATION DOSE REDUCTION: This exam was performed according to the departmental dose-optimization program which includes automated exposure control, adjustment of the mA and/or kV according to patient size and/or use of iterative reconstruction technique. CONTRAST:  41m OMNIPAQUE IOHEXOL 350 MG/ML SOLN COMPARISON:  August 18, 2021. FINDINGS: Cardiovascular: Satisfactory opacification of the pulmonary arteries to the segmental level. No evidence of pulmonary embolism. Normal heart size. No pericardial effusion. Status post coronary bypass graft. Mediastinum/Nodes: Thyroid gland and esophagus are unremarkable. 2 cm right hilar lymph node is noted. 1.7 cm subcarinal adenopathy is noted. These are decreased compared to prior exam. 8 mm left supraclavicular lymph node is noted which is decreased compared to prior exam. Lungs/Pleura: No pneumothorax or pleural effusion is noted. 2.8 x 2.0 cm cavitating abnormality is noted in right upper lobe which is decreased compared to prior exam and consistent with history of primary malignancy. Multiple pulmonary nodules are noted bilaterally consistent with metastatic disease, which are decreased in size compared to prior exam. Upper Abdomen: 11 mm low density is noted in dome of right hepatic lobe  consistent with metastatic disease. Right adrenal gland mass is noted consistent with metastatic disease. Musculoskeletal: No acute abnormality seen. Review of the MIP images confirms the above findings. IMPRESSION: No definite evidence of pulmonary embolus. 2.8 cm cavitating lesion noted in right upper lobe consistent with primary malignancy which is decreased in size compared to prior exam. Also noted is improving mediastinal adenopathy and pulmonary metastatic lesions compared to prior exam. Right hepatic low density and right adrenal gland mass are noted consistent with metastatic disease. Aortic Atherosclerosis (ICD10-I70.0). Electronically Signed   By: JMarijo ConceptionM.D.   On: 09/29/2021 13:49     ASSESSMENT/PLAN:  This is a very pleasant 56year old African-American female diagnosed with stage IV (T2 a, N2, M1 C) non-small cell lung cancer, adenocarcinoma.  The patient presented with a right upper lobe lung mass in addition to right hilar and mediastinal lymphadenopathy.  She also has innumerable bilateral pulmonary nodules, metastatic bone, liver, and adrenal lesions.  She was diagnosed in August 2023.  She is positive for K-ras G12 C mutation and her PD-L1 expression is 89%.  The patient is currently undergoing palliative radiotherapy to the metastatic bone lesions at L4 and the sacrum ***under the care of Dr. MLisbeth Renshaw  The last day radiation is tentatively scheduled for 10/21/21.   Patient is currently undergoing palliative systemic chemotherapy with carboplatin for an AUC of 5, Alimta 500 mg per metered square, Keytruda 200 mg IV every 3 weeks.  She is status post 2 cycles.  She is tolerating it fairly well except for fatigue.  The patient recently had a CT angiogram performed.  Dr. MJulien Nordmannpersonally independently reviewed the scan discussed results with the patient today.  The scan showed improvement in her disease. Dr. MJulien Nordmannrecommends that the patient proceed with cycle #3 today  scheduled.  We we will see her back for follow-up visit in 3 weeks for evaluation and repeat blood work before starting cycle #4.  Superficial thrombosis?  Tylenol and heating pad if needed  She is scheduled to see NLexine Batonfrom palliative care later this week on 10/16/2021.  Currently undergoing palliative radiation.  She is taking Norco every 8 hours for pain.  Seeing counselor service?  The patient is requesting  dental clearance for root canal.  Dr. Julien Nordmann would recommend avoiding any root canal surgery and this can be revisited when she starts maintenance chemotherapy and immunotherapy.  The patient was advised to call immediately if she has any concerning symptoms in the interval. The patient voices understanding of current disease status and treatment options and is in agreement with the current care plan. All questions were answered. The patient knows to call the clinic with any problems, questions or concerns. We can certainly see the patient much sooner if necessary      No orders of the defined types were placed in this encounter.    I spent {CHL ONC TIME VISIT - LNLGX:[id number]} counseling the patient face to face. The total time spent in the appointment was {CHL ONC TIME VISIT - XKGYJ:[id number]}.  Mikiala Fugett L Laterria [place]ota, PA-C 10/12/21

## 2021-10-13 ENCOUNTER — Ambulatory Visit
Admission: RE | Admit: 2021-10-13 | Discharge: 2021-10-13 | Disposition: A | Discharge status: Home / Self Care | Payer: Medicaid Other | Source: Ambulatory Visit | Attending: Radiation Oncology | Admitting: Radiation Oncology

## 2021-10-13 ENCOUNTER — Other Ambulatory Visit: Payer: Self-pay

## 2021-10-13 DIAGNOSIS — Z510 Encounter for antineoplastic radiation therapy: Secondary | ICD-10-CM | POA: Diagnosis not present

## 2021-10-13 LAB — RAD ONC ARIA SESSION SUMMARY
Course Elapsed Days: 12
Plan Fractions Treated to Date: 9
Plan Prescribed Dose Per Fraction: 2.5 Gy
Plan Total Fractions Prescribed: 15
Plan Total Prescribed Dose: 37.5 Gy
Reference Point Dosage Given to Date: 22.5 Gy
Reference Point Session Dosage Given: 2.5 Gy
Session Number: 9

## 2021-10-13 MED FILL — Dexamethasone Sodium Phosphate Inj 100 MG/10ML: INTRAMUSCULAR | Qty: 1 | Status: AC

## 2021-10-13 MED FILL — Fosaprepitant Dimeglumine For IV Infusion 150 MG (Base Eq): INTRAVENOUS | Qty: 5 | Status: AC

## 2021-10-14 ENCOUNTER — Inpatient Hospital Stay: Payer: Medicaid Other | Attending: Internal Medicine

## 2021-10-14 ENCOUNTER — Ambulatory Visit
Admission: RE | Admit: 2021-10-14 | Discharge: 2021-10-14 | Disposition: A | Discharge status: Home / Self Care | Payer: Medicaid Other | Source: Ambulatory Visit | Attending: Radiation Oncology | Admitting: Radiation Oncology

## 2021-10-14 ENCOUNTER — Other Ambulatory Visit: Payer: Self-pay

## 2021-10-14 ENCOUNTER — Inpatient Hospital Stay: Payer: Medicaid Other

## 2021-10-14 ENCOUNTER — Inpatient Hospital Stay (HOSPITAL_BASED_OUTPATIENT_CLINIC_OR_DEPARTMENT_OTHER): Payer: Medicaid Other | Admitting: Physician Assistant

## 2021-10-14 VITALS — BP 95/57 | HR 71 | Temp 98.0°F | Resp 16 | Ht 67.5 in | Wt 208.1 lb

## 2021-10-14 VITALS — BP 124/72 | HR 71 | Temp 97.8°F | Resp 18

## 2021-10-14 DIAGNOSIS — D573 Sickle-cell trait: Secondary | ICD-10-CM | POA: Insufficient documentation

## 2021-10-14 DIAGNOSIS — Z510 Encounter for antineoplastic radiation therapy: Secondary | ICD-10-CM | POA: Diagnosis not present

## 2021-10-14 DIAGNOSIS — C787 Secondary malignant neoplasm of liver and intrahepatic bile duct: Secondary | ICD-10-CM | POA: Insufficient documentation

## 2021-10-14 DIAGNOSIS — C3491 Malignant neoplasm of unspecified part of right bronchus or lung: Secondary | ICD-10-CM

## 2021-10-14 DIAGNOSIS — Z5112 Encounter for antineoplastic immunotherapy: Secondary | ICD-10-CM | POA: Insufficient documentation

## 2021-10-14 DIAGNOSIS — C7800 Secondary malignant neoplasm of unspecified lung: Secondary | ICD-10-CM | POA: Insufficient documentation

## 2021-10-14 DIAGNOSIS — M79603 Pain in arm, unspecified: Secondary | ICD-10-CM | POA: Insufficient documentation

## 2021-10-14 DIAGNOSIS — C7931 Secondary malignant neoplasm of brain: Secondary | ICD-10-CM | POA: Insufficient documentation

## 2021-10-14 DIAGNOSIS — C7971 Secondary malignant neoplasm of right adrenal gland: Secondary | ICD-10-CM | POA: Insufficient documentation

## 2021-10-14 DIAGNOSIS — Z8774 Personal history of (corrected) congenital malformations of heart and circulatory system: Secondary | ICD-10-CM | POA: Insufficient documentation

## 2021-10-14 DIAGNOSIS — Z923 Personal history of irradiation: Secondary | ICD-10-CM | POA: Insufficient documentation

## 2021-10-14 DIAGNOSIS — Z79899 Other long term (current) drug therapy: Secondary | ICD-10-CM | POA: Insufficient documentation

## 2021-10-14 DIAGNOSIS — C7972 Secondary malignant neoplasm of left adrenal gland: Secondary | ICD-10-CM | POA: Insufficient documentation

## 2021-10-14 DIAGNOSIS — R5383 Other fatigue: Secondary | ICD-10-CM | POA: Insufficient documentation

## 2021-10-14 DIAGNOSIS — K219 Gastro-esophageal reflux disease without esophagitis: Secondary | ICD-10-CM | POA: Insufficient documentation

## 2021-10-14 DIAGNOSIS — Z5111 Encounter for antineoplastic chemotherapy: Secondary | ICD-10-CM | POA: Diagnosis not present

## 2021-10-14 DIAGNOSIS — C7951 Secondary malignant neoplasm of bone: Secondary | ICD-10-CM | POA: Insufficient documentation

## 2021-10-14 DIAGNOSIS — F17210 Nicotine dependence, cigarettes, uncomplicated: Secondary | ICD-10-CM | POA: Insufficient documentation

## 2021-10-14 DIAGNOSIS — R0989 Other specified symptoms and signs involving the circulatory and respiratory systems: Secondary | ICD-10-CM | POA: Insufficient documentation

## 2021-10-14 DIAGNOSIS — R61 Generalized hyperhidrosis: Secondary | ICD-10-CM | POA: Insufficient documentation

## 2021-10-14 DIAGNOSIS — Z880 Allergy status to penicillin: Secondary | ICD-10-CM | POA: Insufficient documentation

## 2021-10-14 DIAGNOSIS — R400 Somnolence: Secondary | ICD-10-CM | POA: Insufficient documentation

## 2021-10-14 DIAGNOSIS — C3411 Malignant neoplasm of upper lobe, right bronchus or lung: Secondary | ICD-10-CM | POA: Insufficient documentation

## 2021-10-14 DIAGNOSIS — R079 Chest pain, unspecified: Secondary | ICD-10-CM | POA: Insufficient documentation

## 2021-10-14 DIAGNOSIS — I10 Essential (primary) hypertension: Secondary | ICD-10-CM | POA: Insufficient documentation

## 2021-10-14 LAB — CBC WITH DIFFERENTIAL (CANCER CENTER ONLY)
Abs Immature Granulocytes: 0.02 10*3/uL (ref 0.00–0.07)
Basophils Absolute: 0 10*3/uL (ref 0.0–0.1)
Basophils Relative: 0 %
Eosinophils Absolute: 0.1 10*3/uL (ref 0.0–0.5)
Eosinophils Relative: 2 %
HCT: 31.4 % — ABNORMAL LOW (ref 36.0–46.0)
Hemoglobin: 11 g/dL — ABNORMAL LOW (ref 12.0–15.0)
Immature Granulocytes: 1 %
Lymphocytes Relative: 22 %
Lymphs Abs: 0.8 10*3/uL (ref 0.7–4.0)
MCH: 29.6 pg (ref 26.0–34.0)
MCHC: 35 g/dL (ref 30.0–36.0)
MCV: 84.6 fL (ref 80.0–100.0)
Monocytes Absolute: 0.4 10*3/uL (ref 0.1–1.0)
Monocytes Relative: 11 %
Neutro Abs: 2.2 10*3/uL (ref 1.7–7.7)
Neutrophils Relative %: 64 %
Platelet Count: 221 10*3/uL (ref 150–400)
RBC: 3.71 MIL/uL — ABNORMAL LOW (ref 3.87–5.11)
RDW: 16.1 % — ABNORMAL HIGH (ref 11.5–15.5)
WBC Count: 3.4 10*3/uL — ABNORMAL LOW (ref 4.0–10.5)
nRBC: 0 % (ref 0.0–0.2)

## 2021-10-14 LAB — RAD ONC ARIA SESSION SUMMARY
Course Elapsed Days: 13
Plan Fractions Treated to Date: 10
Plan Prescribed Dose Per Fraction: 2.5 Gy
Plan Total Fractions Prescribed: 15
Plan Total Prescribed Dose: 37.5 Gy
Reference Point Dosage Given to Date: 25 Gy
Reference Point Session Dosage Given: 2.5 Gy
Session Number: 10

## 2021-10-14 LAB — CMP (CANCER CENTER ONLY)
ALT: 12 U/L (ref 0–44)
AST: 10 U/L — ABNORMAL LOW (ref 15–41)
Albumin: 3.9 g/dL (ref 3.5–5.0)
Alkaline Phosphatase: 104 U/L (ref 38–126)
Anion gap: 6 (ref 5–15)
BUN: 9 mg/dL (ref 6–20)
CO2: 26 mmol/L (ref 22–32)
Calcium: 8.3 mg/dL — ABNORMAL LOW (ref 8.9–10.3)
Chloride: 106 mmol/L (ref 98–111)
Creatinine: 0.65 mg/dL (ref 0.44–1.00)
GFR, Estimated: >60 mL/min (ref >60)
Glucose, Bld: 288 mg/dL — ABNORMAL HIGH (ref 70–99)
Potassium: 3.6 mmol/L (ref 3.5–5.1)
Sodium: 138 mmol/L (ref 135–145)
Total Bilirubin: 0.4 mg/dL (ref 0.3–1.2)
Total Protein: 6.1 g/dL — ABNORMAL LOW (ref 6.5–8.1)

## 2021-10-14 LAB — TSH: TSH: 4.198 u[IU]/mL (ref 0.350–4.500)

## 2021-10-14 MED ORDER — SODIUM CHLORIDE 0.9 % IV SOLN
697.5000 mg | Freq: Once | INTRAVENOUS | Status: AC
  Administered 2021-10-14: 700 mg via INTRAVENOUS
  Filled 2021-10-14: qty 70

## 2021-10-14 MED ORDER — SODIUM CHLORIDE 0.9 % IV SOLN: Freq: Once | INTRAVENOUS | Status: AC

## 2021-10-14 MED ORDER — DIPHENHYDRAMINE HCL 50 MG/ML IJ SOLN
25.0000 mg | Freq: Once | INTRAMUSCULAR | Status: AC
  Administered 2021-10-14: 25 mg via INTRAVENOUS
  Filled 2021-10-14: qty 1

## 2021-10-14 MED ORDER — INSULIN ASPART 100 UNIT/ML IJ SOLN
6.0000 [IU] | Freq: Once | INTRAMUSCULAR | Status: AC
  Administered 2021-10-14: 6 [IU] via SUBCUTANEOUS
  Filled 2021-10-14: qty 1

## 2021-10-14 MED ORDER — PALONOSETRON HCL INJECTION 0.25 MG/5ML
0.2500 mg | Freq: Once | INTRAVENOUS | Status: AC
  Administered 2021-10-14: 0.25 mg via INTRAVENOUS
  Filled 2021-10-14: qty 5

## 2021-10-14 MED ORDER — SODIUM CHLORIDE 0.9 % IV SOLN
500.0000 mg/m2 | Freq: Once | INTRAVENOUS | Status: AC
  Administered 2021-10-14: 1100 mg via INTRAVENOUS
  Filled 2021-10-14: qty 40

## 2021-10-14 MED ORDER — SODIUM CHLORIDE 0.9 % IV SOLN
10.0000 mg | Freq: Once | INTRAVENOUS | Status: AC
  Administered 2021-10-14: 10 mg via INTRAVENOUS
  Filled 2021-10-14: qty 10

## 2021-10-14 MED ORDER — SODIUM CHLORIDE 0.9 % IV SOLN
150.0000 mg | Freq: Once | INTRAVENOUS | Status: AC
  Administered 2021-10-14: 150 mg via INTRAVENOUS
  Filled 2021-10-14: qty 150

## 2021-10-14 MED ORDER — CYANOCOBALAMIN 1000 MCG/ML IJ SOLN
1000.0000 ug | Freq: Once | INTRAMUSCULAR | Status: AC
  Administered 2021-10-14: 1000 ug via INTRAMUSCULAR
  Filled 2021-10-14: qty 1

## 2021-10-14 MED ORDER — SODIUM CHLORIDE 0.9 % IV SOLN
200.0000 mg | Freq: Once | INTRAVENOUS | Status: AC
  Administered 2021-10-14: 200 mg via INTRAVENOUS
  Filled 2021-10-14: qty 200

## 2021-10-14 NOTE — Patient Instructions (Signed)
[facility]  Discharge Instructions: Thank you for choosing [facility] to provide your oncology and hematology care.   If you have a lab appointment with the [facility], please go directly to the [facility] and check in at the registration area.   Wear comfortable clothing and clothing appropriate for easy access to any Portacath or PICC line.   We strive to give you quality time with your provider. You may need to reschedule your appointment if you arrive late (15 or more minutes).  Arriving late affects you and other patients whose appointments are after yours.  Also, if you miss three or more appointments without notifying the office, you may be dismissed from the clinic at the provider's discretion.      For prescription refill requests, have your pharmacy contact our office and allow 72 hours for refills to be completed.    Today you received the following chemotherapy and/or immunotherapy agents: Keytruda, Alimta, and Carboplatin      To help prevent nausea and vomiting after your treatment, we encourage you to take your nausea medication as directed.  BELOW ARE SYMPTOMS THAT SHOULD BE REPORTED IMMEDIATELY: *FEVER GREATER THAN 100.4 F (38 C) OR HIGHER *CHILLS OR SWEATING *NAUSEA AND VOMITING THAT IS NOT CONTROLLED WITH YOUR NAUSEA MEDICATION *UNUSUAL SHORTNESS OF BREATH *UNUSUAL BRUISING OR BLEEDING *URINARY PROBLEMS (pain or burning when urinating, or frequent urination) *BOWEL PROBLEMS (unusual diarrhea, constipation, pain near the anus) TENDERNESS IN MOUTH AND THROAT WITH OR WITHOUT PRESENCE OF ULCERS (sore throat, sores in mouth, or a toothache) UNUSUAL RASH, SWELLING OR PAIN  UNUSUAL VAGINAL DISCHARGE OR ITCHING   Items with * indicate a potential emergency and should be followed up as soon as possible or go to the Emergency Department if any problems should occur.  Please show the CHEMOTHERAPY ALERT CARD or IMMUNOTHERAPY  ALERT CARD at check-in to the Emergency Department and triage nurse.  Should you have questions after your visit or need to cancel or reschedule your appointment, please contact [facility]  Dept: [phone]  and follow the prompts.  Office hours are 8:00 a.m. to 4:30 p.m. Monday - Friday. Please note that voicemails left after 4:00 p.m. may not be returned until the following business day.  We are closed weekends and major holidays. You have access to a nurse at all times for urgent questions. Please call the main number to the clinic Dept: [phone] and follow the prompts.   For any non-urgent questions, you may also contact your provider using MyChart. We now offer e-Visits for anyone 28 and older to request care online for non-urgent symptoms. For details visit [email or web address].   Also download the MyChart app! Go to the app store, search "MyChart", open the app, select [facility], and log in with your MyChart username and password.  Masks are optional in the cancer centers. If you would like for your care team to wear a mask while they are taking care of you, please let them know. You may have one support person who is at least 58 years old accompany you for your appointments.

## 2021-10-15 ENCOUNTER — Other Ambulatory Visit: Payer: Self-pay

## 2021-10-15 ENCOUNTER — Ambulatory Visit
Admission: RE | Admit: 2021-10-15 | Discharge: 2021-10-15 | Disposition: A | Discharge status: Home / Self Care | Payer: Medicaid Other | Source: Ambulatory Visit | Attending: Radiation Oncology | Admitting: Radiation Oncology

## 2021-10-15 DIAGNOSIS — Z510 Encounter for antineoplastic radiation therapy: Secondary | ICD-10-CM | POA: Diagnosis not present

## 2021-10-15 LAB — RAD ONC ARIA SESSION SUMMARY
Course Elapsed Days: 14
Plan Fractions Treated to Date: 11
Plan Prescribed Dose Per Fraction: 2.5 Gy
Plan Total Fractions Prescribed: 15
Plan Total Prescribed Dose: 37.5 Gy
Reference Point Dosage Given to Date: 27.5 Gy
Reference Point Session Dosage Given: 2.5 Gy
Session Number: 11

## 2021-10-16 ENCOUNTER — Ambulatory Visit: Payer: Medicaid Other

## 2021-10-16 ENCOUNTER — Inpatient Hospital Stay: Payer: Medicaid Other | Admitting: Nurse Practitioner

## 2021-10-16 ENCOUNTER — Telehealth: Payer: Self-pay

## 2021-10-16 LAB — T4: T4, Total: 7.2 ug/dL (ref 4.5–12.0)

## 2021-10-16 NOTE — Telephone Encounter (Signed)
   Pre-operative Risk Assessment    Patient Name: Cassidy [name]  DOB: 08-Sep-1965 MRN: [id number]    Request for Surgical Clearance    Procedure:   Dental treatment: Radiographs, Fillings, Root Canal Therapy  Date of Surgery:  Clearance TBD                                 Surgeon:  [facility]'s Group or Practice Name:  [facility]  Phone number:  [phone] Fax number:  [phone]   Type of Clearance Requested:   - Pharmacy:  Hold Aspirin     Type of Anesthesia:  Local    Additional requests/questions:      SignedEna Dawley   10/16/2021, 1:45 PM

## 2021-10-16 NOTE — Telephone Encounter (Signed)
    Primary Cardiologist: Evalina Field, MD  Chart reviewed as part of pre-operative protocol coverage. Simple dental extractions are considered low risk procedures per guidelines and generally do not require any specific cardiac clearance. It is also generally accepted that for simple extractions and dental cleanings, there is no need to interrupt blood thinner therapy.   SBE prophylaxis is not required for the patient.  I will route this recommendation to the requesting party via Epic fax function and remove from pre-op pool.  Please call with questions.  Deberah Pelton, NP 10/16/2021, 2:34 PM

## 2021-10-19 ENCOUNTER — Ambulatory Visit
Admission: RE | Admit: 2021-10-19 | Discharge: 2021-10-19 | Disposition: A | Discharge status: Home / Self Care | Payer: Medicaid Other | Source: Ambulatory Visit | Attending: Radiation Oncology | Admitting: Radiation Oncology

## 2021-10-19 ENCOUNTER — Other Ambulatory Visit: Payer: Self-pay

## 2021-10-19 DIAGNOSIS — Z510 Encounter for antineoplastic radiation therapy: Secondary | ICD-10-CM | POA: Diagnosis not present

## 2021-10-19 LAB — RAD ONC ARIA SESSION SUMMARY
Course Elapsed Days: 18
Plan Fractions Treated to Date: 12
Plan Prescribed Dose Per Fraction: 2.5 Gy
Plan Total Fractions Prescribed: 15
Plan Total Prescribed Dose: 37.5 Gy
Reference Point Dosage Given to Date: 30 Gy
Reference Point Session Dosage Given: 2.5 Gy
Session Number: 12

## 2021-10-20 ENCOUNTER — Telehealth: Payer: Self-pay | Admitting: Medical Oncology

## 2021-10-20 ENCOUNTER — Other Ambulatory Visit: Payer: Self-pay

## 2021-10-20 ENCOUNTER — Ambulatory Visit
Admission: RE | Admit: 2021-10-20 | Discharge: 2021-10-20 | Disposition: A | Discharge status: Home / Self Care | Payer: Medicaid Other | Source: Ambulatory Visit | Attending: Radiation Oncology | Admitting: Radiation Oncology

## 2021-10-20 DIAGNOSIS — Z510 Encounter for antineoplastic radiation therapy: Secondary | ICD-10-CM | POA: Diagnosis not present

## 2021-10-20 LAB — RAD ONC ARIA SESSION SUMMARY
Course Elapsed Days: 19
Plan Fractions Treated to Date: 13
Plan Prescribed Dose Per Fraction: 2.5 Gy
Plan Total Fractions Prescribed: 15
Plan Total Prescribed Dose: 37.5 Gy
Reference Point Dosage Given to Date: 32.5 Gy
Reference Point Session Dosage Given: 2.5 Gy
Session Number: 13

## 2021-10-20 NOTE — Telephone Encounter (Signed)
Nausea,diarrhea ,weak since Friday . Fever 101/chills last Friday and reports temp normal today ,but has sweats.  Imodium helps some. She is eating and drinking ,but it "is going straight through me ". Per Dr Julien Nordmann , I instructed pt to go to ED.

## 2021-10-21 ENCOUNTER — Other Ambulatory Visit: Payer: Self-pay

## 2021-10-21 ENCOUNTER — Ambulatory Visit
Admission: RE | Admit: 2021-10-21 | Discharge: 2021-10-21 | Disposition: A | Discharge status: Home / Self Care | Payer: Medicaid Other | Source: Ambulatory Visit | Attending: Radiation Oncology | Admitting: Radiation Oncology

## 2021-10-21 ENCOUNTER — Inpatient Hospital Stay: Payer: Medicaid Other

## 2021-10-21 ENCOUNTER — Ambulatory Visit: Payer: Medicaid Other

## 2021-10-21 ENCOUNTER — Telehealth: Payer: Self-pay | Admitting: Medical Oncology

## 2021-10-21 DIAGNOSIS — C3491 Malignant neoplasm of unspecified part of right bronchus or lung: Secondary | ICD-10-CM

## 2021-10-21 DIAGNOSIS — Z510 Encounter for antineoplastic radiation therapy: Secondary | ICD-10-CM | POA: Diagnosis not present

## 2021-10-21 LAB — CBC WITH DIFFERENTIAL/PLATELET
Abs Immature Granulocytes: 0 10*3/uL (ref 0.00–0.07)
Basophils Absolute: 0 10*3/uL (ref 0.0–0.1)
Basophils Relative: 0 %
Eosinophils Absolute: 0.1 10*3/uL (ref 0.0–0.5)
Eosinophils Relative: 7 %
HCT: 31.4 % — ABNORMAL LOW (ref 36.0–46.0)
Hemoglobin: 11 g/dL — ABNORMAL LOW (ref 12.0–15.0)
Immature Granulocytes: 0 %
Lymphocytes Relative: 27 %
Lymphs Abs: 0.5 10*3/uL — ABNORMAL LOW (ref 0.7–4.0)
MCH: 29.2 pg (ref 26.0–34.0)
MCHC: 35 g/dL (ref 30.0–36.0)
MCV: 83.3 fL (ref 80.0–100.0)
Monocytes Absolute: 0.2 10*3/uL (ref 0.1–1.0)
Monocytes Relative: 12 %
Neutro Abs: 0.9 10*3/uL — ABNORMAL LOW (ref 1.7–7.7)
Neutrophils Relative %: 54 %
Platelets: 119 10*3/uL — ABNORMAL LOW (ref 150–400)
RBC: 3.77 MIL/uL — ABNORMAL LOW (ref 3.87–5.11)
RDW: 15.8 % — ABNORMAL HIGH (ref 11.5–15.5)
WBC: 1.7 10*3/uL — ABNORMAL LOW (ref 4.0–10.5)
nRBC: 0 % (ref 0.0–0.2)

## 2021-10-21 LAB — COMPREHENSIVE METABOLIC PANEL
ALT: 14 U/L (ref 0–44)
AST: 13 U/L — ABNORMAL LOW (ref 15–41)
Albumin: 3.9 g/dL (ref 3.5–5.0)
Alkaline Phosphatase: 93 U/L (ref 38–126)
Anion gap: 7 (ref 5–15)
BUN: 16 mg/dL (ref 6–20)
CO2: 23 mmol/L (ref 22–32)
Calcium: 8.6 mg/dL — ABNORMAL LOW (ref 8.9–10.3)
Chloride: 104 mmol/L (ref 98–111)
Creatinine, Ser: 0.73 mg/dL (ref 0.44–1.00)
GFR, Estimated: >60 mL/min (ref >60)
Glucose, Bld: 303 mg/dL — ABNORMAL HIGH (ref 70–99)
Potassium: 3.8 mmol/L (ref 3.5–5.1)
Sodium: 134 mmol/L — ABNORMAL LOW (ref 135–145)
Total Bilirubin: 0.7 mg/dL (ref 0.3–1.2)
Total Protein: 6.7 g/dL (ref 6.5–8.1)

## 2021-10-21 LAB — RAD ONC ARIA SESSION SUMMARY
Course Elapsed Days: 20
Plan Fractions Treated to Date: 14
Plan Prescribed Dose Per Fraction: 2.5 Gy
Plan Total Fractions Prescribed: 15
Plan Total Prescribed Dose: 37.5 Gy
Reference Point Dosage Given to Date: 35 Gy
Reference Point Session Dosage Given: 2.5 Gy
Session Number: 14

## 2021-10-21 NOTE — Telephone Encounter (Addendum)
Recurrent "tender knots "on left arm . Pt  coming in for weekly labs and radiation and RN will assess arm .   Left arm with 1-2 knots between forearm and elbow  .Very tender to touch.    No redness/minimal swelling  Pt states she feels better today than yesterday "I am back to my normal self". I instructed pt to apply warm moist washcloths on forearm 4 times /daily until pain is resolved and to call if pain /knots are worsening.

## 2021-10-22 ENCOUNTER — Encounter: Payer: Self-pay | Admitting: Radiation Oncology

## 2021-10-22 ENCOUNTER — Ambulatory Visit
Admission: RE | Admit: 2021-10-22 | Discharge: 2021-10-22 | Disposition: A | Discharge status: Home / Self Care | Payer: Medicaid Other | Source: Ambulatory Visit | Attending: Radiation Oncology | Admitting: Radiation Oncology

## 2021-10-22 ENCOUNTER — Other Ambulatory Visit: Payer: Self-pay

## 2021-10-22 DIAGNOSIS — Z510 Encounter for antineoplastic radiation therapy: Secondary | ICD-10-CM | POA: Diagnosis not present

## 2021-10-22 LAB — RAD ONC ARIA SESSION SUMMARY
Course Elapsed Days: 21
Plan Fractions Treated to Date: 15
Plan Prescribed Dose Per Fraction: 2.5 Gy
Plan Total Fractions Prescribed: 15
Plan Total Prescribed Dose: 37.5 Gy
Reference Point Dosage Given to Date: 37.5 Gy
Reference Point Session Dosage Given: 2.5 Gy
Session Number: 15

## 2021-10-22 NOTE — Patient Instructions (Addendum)
It was wonderful to see you today.  Please bring ALL of your medications with you to every visit.   Today we talked about:  -For your blood pressure you can stop the Amlodipine but do take the carvedilol and Losartan.  -We are starting you back on long acting insulin. Take 10 units daily in the morning. Check your sugars before and then check them in the evening. If your morning fasting sugars are >130, increase your insulin by 2 units. Continue this until your morning sugars are <130.  -Come back in 1 month so that we can review your sugars. -You received the flu shot today.   Thank you for choosing [facility].   Please call [phone] with any questions about today's appointment.  Please be sure to schedule follow up at the front  desk before you leave today.   [name], DO PGY-3 Family Medicine

## 2021-10-22 NOTE — Progress Notes (Signed)
Lysle Morales, counseling intern, met with patient for counseling session.   The patient reported the week prior they were feeling ill due to chemo treatment.  The patient reported they continue to have experiences where they connect and feel love from strangers. 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 The book is titled Production designer, theatre/television/film, Stories to Lift your Spirits. The patient shared their favorite quote by Benedetto Coons. The patient expressed their love for Benedetto Coons because of her ability to grow in other senses when she lost her sight. The counselor reflected the desire to love others, even strangers, is a sense the patient has grown in due to hardship.   The patient expressed this last week when they were ill, they felt their brother and son should have offered to help. The patient expressed they should have known and not been asked. The counselor and patient created a goal - this next week, the patient will ask their brother or son for help with groceries.   The patient did not have their calendar with them. The counselor will call the patient on Monday to schedule their next appointment.   Lysle Morales,  Counseling Intern

## 2021-10-22 NOTE — Progress Notes (Addendum)
    SUBJECTIVE:   CHIEF COMPLAINT / HPI:   Cassidy [name] is a 56 y.o. female who presents to the [facility] clinic today to discuss the following concerns:   Elevated sugars Ms. Hurtado was recently diagnosed with stage 4 non-small cell lung cancer. She has been undergoing chemotherapy. She has a hx of T2DM that was previously well controlled soley on Trulicity. Her Trulicity was d/c at our last appointment given her weight loss and significant lack of appetite. Since discontinuation and since start of her cancer treatments, she has been experiencing elevated sugars (200s-300s).  She receives IV steroids with her chemotherapy treatments. She checks her sugars every morning and notes that lately it has been in the 200s. She has tried Metformin in the past but didn't tolerate due to GI side effects.   PERTINENT  PMH / PSH: Stage 4 adenocarcinoma of the lung, hypertension, CAD s/p CABG x3, GERD, T2DM, PTSD, bipolar 1   OBJECTIVE:   BP 119/70   Pulse 80   Ht 5' 7.5" (1.715 m)   Wt 207 lb 9.6 oz (94.2 kg)   LMP 06/12/2016 (Approximate)   SpO2 100%   BMI 32.03 kg/m    General: NAD, pleasant, able to participate in exam Cardiac: RRR, no murmurs. Respiratory: CTAB, normal effort, No wheezes, rales or rhonchi Extremities: no edema  Psych: Normal affect and mood  ASSESSMENT/PLAN:   Type 2 diabetes mellitus without complications (HCC) Previously well controlled solely on Trulicity. Last A1c in June was at goal. Elevated sugars recently in the setting of steroids with her chemotherapy. Given this, elected to start her back on insulin- she was amenable to this as she already checks her sugars daily.  -Start Levemir 10 units twice daily -Rx more glucose test strips; she reports having supply of needles -She was given instructions on how to up titrate -F/u scheduled with me in 1 month. Advised her to bring her glucose log to review   Adenocarcinoma of right lung, stage 4 ([facility]) Followed closely  with Oncology. She showed me her certificate of radiation completion. Continues on chemotherapy. Patient was in much brighter spirits today. She notes ongoing discussions with oncologist regarding insertion of Port-a-cath, she would personally like to avoid this for as long as possible given concern for infection.   Need for influenza vaccination Received vaccine today without complication.    [name], [facility]

## 2021-10-23 ENCOUNTER — Telehealth: Payer: Self-pay

## 2021-10-23 NOTE — Telephone Encounter (Signed)
Cassidy [name], counseling intern, called patient to schedule next counseling session.   Patient scheduled session for Monday, October 16th at 2:30pm.  Cassidy [name]  Counseling Intern

## 2021-10-26 ENCOUNTER — Telehealth: Payer: Self-pay | Admitting: Internal Medicine

## 2021-10-26 NOTE — Progress Notes (Signed)
Cassidy [name], counseling intern, saw patient for counseling session.   The patient shared they had a good weekend visiting with their son and their brother. The patient reported they spent time journaling over the weekend. The patient wrote about what they wanted to leave behind and what they want to take with them in the future. The patient wrote positive mantras to follow by.   To prepare for the patient's next chemo session, the patient plans on asking their son and brother for help prior to feeling ill. The patient reported they plan on spending time with family and friends for their birthday on Thursday.   The patient scheduled their next counseling session for Thursday, November 2nd at [facility],  Counseling Intern  [phone]

## 2021-10-26 NOTE — Telephone Encounter (Signed)
Called patient regarding upcoming October, November and December appointments. Left a voicemail.

## 2021-10-27 ENCOUNTER — Ambulatory Visit (INDEPENDENT_AMBULATORY_CARE_PROVIDER_SITE_OTHER): Payer: Medicaid Other | Admitting: Family Medicine

## 2021-10-27 ENCOUNTER — Encounter: Payer: Self-pay | Admitting: Family Medicine

## 2021-10-27 VITALS — BP 119/70 | HR 80 | Ht 67.5 in | Wt 207.6 lb

## 2021-10-27 DIAGNOSIS — Z794 Long term (current) use of insulin: Secondary | ICD-10-CM

## 2021-10-27 DIAGNOSIS — Z23 Encounter for immunization: Secondary | ICD-10-CM | POA: Diagnosis not present

## 2021-10-27 DIAGNOSIS — C3491 Malignant neoplasm of unspecified part of right bronchus or lung: Secondary | ICD-10-CM | POA: Diagnosis not present

## 2021-10-27 DIAGNOSIS — E119 Type 2 diabetes mellitus without complications: Secondary | ICD-10-CM

## 2021-10-27 MED ORDER — LEVEMIR FLEXPEN 100 UNIT/ML ~~LOC~~ SOPN: 10.0000 [IU] | PEN_INJECTOR | Freq: Every day | SUBCUTANEOUS | 1 refills | Status: DC

## 2021-10-27 MED ORDER — ACCU-CHEK AVIVA PLUS VI STRP: ORAL_STRIP | 1 refills | Status: DC

## 2021-10-27 NOTE — Assessment & Plan Note (Addendum)
Previously well controlled solely on Trulicity. Last A1c in June was at goal. Elevated sugars recently in the setting of steroids with her chemotherapy. Given this, elected to start her back on insulin- she was amenable to this as she already checks her sugars daily.  -Start Levemir 10 units twice daily -Rx more glucose test strips; she reports having supply of needles -She was given instructions on how to up titrate -F/u scheduled with me in 1 month. Advised her to bring her glucose log to review

## 2021-10-27 NOTE — Assessment & Plan Note (Signed)
Received vaccine today without complication.

## 2021-10-27 NOTE — Assessment & Plan Note (Signed)
Followed closely with Oncology. She showed me her certificate of radiation completion. Continues on chemotherapy. Patient was in much brighter spirits today. She notes ongoing discussions with oncologist regarding insertion of Port-a-cath, she would personally like to avoid this for as long as possible given concern for infection.

## 2021-10-28 ENCOUNTER — Inpatient Hospital Stay: Payer: Medicaid Other

## 2021-10-28 ENCOUNTER — Other Ambulatory Visit: Payer: Self-pay | Admitting: Cardiovascular Disease

## 2021-10-28 DIAGNOSIS — Z880 Allergy status to penicillin: Secondary | ICD-10-CM | POA: Insufficient documentation

## 2021-10-28 DIAGNOSIS — Z5111 Encounter for antineoplastic chemotherapy: Secondary | ICD-10-CM | POA: Diagnosis present

## 2021-10-28 DIAGNOSIS — I10 Essential (primary) hypertension: Secondary | ICD-10-CM | POA: Diagnosis not present

## 2021-10-28 DIAGNOSIS — C7800 Secondary malignant neoplasm of unspecified lung: Secondary | ICD-10-CM | POA: Insufficient documentation

## 2021-10-28 DIAGNOSIS — C7951 Secondary malignant neoplasm of bone: Secondary | ICD-10-CM | POA: Diagnosis not present

## 2021-10-28 DIAGNOSIS — D573 Sickle-cell trait: Secondary | ICD-10-CM | POA: Diagnosis not present

## 2021-10-28 DIAGNOSIS — Z5112 Encounter for antineoplastic immunotherapy: Secondary | ICD-10-CM | POA: Insufficient documentation

## 2021-10-28 DIAGNOSIS — R0989 Other specified symptoms and signs involving the circulatory and respiratory systems: Secondary | ICD-10-CM | POA: Diagnosis not present

## 2021-10-28 DIAGNOSIS — C7931 Secondary malignant neoplasm of brain: Secondary | ICD-10-CM | POA: Insufficient documentation

## 2021-10-28 DIAGNOSIS — C787 Secondary malignant neoplasm of liver and intrahepatic bile duct: Secondary | ICD-10-CM | POA: Diagnosis not present

## 2021-10-28 DIAGNOSIS — K219 Gastro-esophageal reflux disease without esophagitis: Secondary | ICD-10-CM | POA: Insufficient documentation

## 2021-10-28 DIAGNOSIS — F17210 Nicotine dependence, cigarettes, uncomplicated: Secondary | ICD-10-CM | POA: Diagnosis not present

## 2021-10-28 DIAGNOSIS — R61 Generalized hyperhidrosis: Secondary | ICD-10-CM | POA: Insufficient documentation

## 2021-10-28 DIAGNOSIS — Z79899 Other long term (current) drug therapy: Secondary | ICD-10-CM | POA: Diagnosis not present

## 2021-10-28 DIAGNOSIS — Z8774 Personal history of (corrected) congenital malformations of heart and circulatory system: Secondary | ICD-10-CM | POA: Diagnosis not present

## 2021-10-28 DIAGNOSIS — R400 Somnolence: Secondary | ICD-10-CM | POA: Diagnosis not present

## 2021-10-28 DIAGNOSIS — Z923 Personal history of irradiation: Secondary | ICD-10-CM | POA: Diagnosis not present

## 2021-10-28 DIAGNOSIS — C7972 Secondary malignant neoplasm of left adrenal gland: Secondary | ICD-10-CM | POA: Insufficient documentation

## 2021-10-28 DIAGNOSIS — C3411 Malignant neoplasm of upper lobe, right bronchus or lung: Secondary | ICD-10-CM | POA: Insufficient documentation

## 2021-10-28 DIAGNOSIS — C7971 Secondary malignant neoplasm of right adrenal gland: Secondary | ICD-10-CM | POA: Insufficient documentation

## 2021-10-28 DIAGNOSIS — R079 Chest pain, unspecified: Secondary | ICD-10-CM | POA: Insufficient documentation

## 2021-10-28 DIAGNOSIS — R5383 Other fatigue: Secondary | ICD-10-CM | POA: Insufficient documentation

## 2021-10-28 DIAGNOSIS — M79603 Pain in arm, unspecified: Secondary | ICD-10-CM | POA: Insufficient documentation

## 2021-10-28 DIAGNOSIS — C3491 Malignant neoplasm of unspecified part of right bronchus or lung: Secondary | ICD-10-CM

## 2021-10-28 LAB — CBC WITH DIFFERENTIAL/PLATELET
Abs Immature Granulocytes: 0.02 10*3/uL (ref 0.00–0.07)
Basophils Absolute: 0 10*3/uL (ref 0.0–0.1)
Basophils Relative: 0 %
Eosinophils Absolute: 0.2 10*3/uL (ref 0.0–0.5)
Eosinophils Relative: 6 %
HCT: 29.1 % — ABNORMAL LOW (ref 36.0–46.0)
Hemoglobin: 10.1 g/dL — ABNORMAL LOW (ref 12.0–15.0)
Immature Granulocytes: 1 %
Lymphocytes Relative: 21 %
Lymphs Abs: 0.6 10*3/uL — ABNORMAL LOW (ref 0.7–4.0)
MCH: 29.3 pg (ref 26.0–34.0)
MCHC: 34.7 g/dL (ref 30.0–36.0)
MCV: 84.3 fL (ref 80.0–100.0)
Monocytes Absolute: 0.3 10*3/uL (ref 0.1–1.0)
Monocytes Relative: 12 %
Neutro Abs: 1.6 10*3/uL — ABNORMAL LOW (ref 1.7–7.7)
Neutrophils Relative %: 60 %
Platelets: 118 10*3/uL — ABNORMAL LOW (ref 150–400)
RBC: 3.45 MIL/uL — ABNORMAL LOW (ref 3.87–5.11)
RDW: 16.8 % — ABNORMAL HIGH (ref 11.5–15.5)
WBC: 2.7 10*3/uL — ABNORMAL LOW (ref 4.0–10.5)
nRBC: 0 % (ref 0.0–0.2)

## 2021-10-28 LAB — COMPREHENSIVE METABOLIC PANEL
ALT: 11 U/L (ref 0–44)
AST: 10 U/L — ABNORMAL LOW (ref 15–41)
Albumin: 3.8 g/dL (ref 3.5–5.0)
Alkaline Phosphatase: 95 U/L (ref 38–126)
Anion gap: 4 — ABNORMAL LOW (ref 5–15)
BUN: 10 mg/dL (ref 6–20)
CO2: 26 mmol/L (ref 22–32)
Calcium: 8.6 mg/dL — ABNORMAL LOW (ref 8.9–10.3)
Chloride: 106 mmol/L (ref 98–111)
Creatinine, Ser: 0.74 mg/dL (ref 0.44–1.00)
GFR, Estimated: >60 mL/min (ref >60)
Glucose, Bld: 260 mg/dL — ABNORMAL HIGH (ref 70–99)
Potassium: 4 mmol/L (ref 3.5–5.1)
Sodium: 136 mmol/L (ref 135–145)
Total Bilirubin: 0.3 mg/dL (ref 0.3–1.2)
Total Protein: 6.3 g/dL — ABNORMAL LOW (ref 6.5–8.1)

## 2021-11-03 MED FILL — Dexamethasone Sodium Phosphate Inj 100 MG/10ML: INTRAMUSCULAR | Qty: 1 | Status: AC

## 2021-11-03 MED FILL — Fosaprepitant Dimeglumine For IV Infusion 150 MG (Base Eq): INTRAVENOUS | Qty: 5 | Status: AC

## 2021-11-04 ENCOUNTER — Inpatient Hospital Stay (HOSPITAL_BASED_OUTPATIENT_CLINIC_OR_DEPARTMENT_OTHER): Payer: Medicaid Other | Admitting: Internal Medicine

## 2021-11-04 ENCOUNTER — Inpatient Hospital Stay: Payer: Medicaid Other

## 2021-11-04 ENCOUNTER — Other Ambulatory Visit: Payer: Self-pay | Admitting: Internal Medicine

## 2021-11-04 ENCOUNTER — Other Ambulatory Visit: Payer: Self-pay

## 2021-11-04 VITALS — BP 100/64 | HR 76 | Temp 97.7°F | Resp 18 | Ht 60.75 in | Wt 206.2 lb

## 2021-11-04 DIAGNOSIS — C3491 Malignant neoplasm of unspecified part of right bronchus or lung: Secondary | ICD-10-CM

## 2021-11-04 DIAGNOSIS — C3490 Malignant neoplasm of unspecified part of unspecified bronchus or lung: Secondary | ICD-10-CM | POA: Diagnosis not present

## 2021-11-04 DIAGNOSIS — Z5112 Encounter for antineoplastic immunotherapy: Secondary | ICD-10-CM | POA: Diagnosis not present

## 2021-11-04 LAB — CMP (CANCER CENTER ONLY)
ALT: 15 U/L (ref 0–44)
AST: 13 U/L — ABNORMAL LOW (ref 15–41)
Albumin: 3.9 g/dL (ref 3.5–5.0)
Alkaline Phosphatase: 84 U/L (ref 38–126)
Anion gap: 8 (ref 5–15)
BUN: 12 mg/dL (ref 6–20)
CO2: 24 mmol/L (ref 22–32)
Calcium: 8.8 mg/dL — ABNORMAL LOW (ref 8.9–10.3)
Chloride: 104 mmol/L (ref 98–111)
Creatinine: 0.75 mg/dL (ref 0.44–1.00)
GFR, Estimated: >60 mL/min (ref >60)
Glucose, Bld: 310 mg/dL — ABNORMAL HIGH (ref 70–99)
Potassium: 4 mmol/L (ref 3.5–5.1)
Sodium: 136 mmol/L (ref 135–145)
Total Bilirubin: 0.6 mg/dL (ref 0.3–1.2)
Total Protein: 6.6 g/dL (ref 6.5–8.1)

## 2021-11-04 LAB — CBC WITH DIFFERENTIAL (CANCER CENTER ONLY)
Abs Immature Granulocytes: 0.02 10*3/uL (ref 0.00–0.07)
Basophils Absolute: 0 10*3/uL (ref 0.0–0.1)
Basophils Relative: 0 %
Eosinophils Absolute: 0.1 10*3/uL (ref 0.0–0.5)
Eosinophils Relative: 2 %
HCT: 31.7 % — ABNORMAL LOW (ref 36.0–46.0)
Hemoglobin: 11.1 g/dL — ABNORMAL LOW (ref 12.0–15.0)
Immature Granulocytes: 1 %
Lymphocytes Relative: 17 %
Lymphs Abs: 0.6 10*3/uL — ABNORMAL LOW (ref 0.7–4.0)
MCH: 30.2 pg (ref 26.0–34.0)
MCHC: 35 g/dL (ref 30.0–36.0)
MCV: 86.1 fL (ref 80.0–100.0)
Monocytes Absolute: 0.3 10*3/uL (ref 0.1–1.0)
Monocytes Relative: 9 %
Neutro Abs: 2.3 10*3/uL (ref 1.7–7.7)
Neutrophils Relative %: 71 %
Platelet Count: 204 10*3/uL (ref 150–400)
RBC: 3.68 MIL/uL — ABNORMAL LOW (ref 3.87–5.11)
RDW: 18.6 % — ABNORMAL HIGH (ref 11.5–15.5)
WBC Count: 3.3 10*3/uL — ABNORMAL LOW (ref 4.0–10.5)
nRBC: 0 % (ref 0.0–0.2)

## 2021-11-04 MED ORDER — PROCHLORPERAZINE MALEATE 10 MG PO TABS: 10.0000 mg | ORAL_TABLET | Freq: Four times a day (QID) | ORAL | 0 refills | Status: DC | PRN

## 2021-11-04 MED ORDER — SODIUM CHLORIDE 0.9 % IV SOLN
697.5000 mg | Freq: Once | INTRAVENOUS | Status: AC
  Administered 2021-11-04: 700 mg via INTRAVENOUS
  Filled 2021-11-04: qty 70

## 2021-11-04 MED ORDER — PALONOSETRON HCL INJECTION 0.25 MG/5ML
0.2500 mg | Freq: Once | INTRAVENOUS | Status: AC
  Administered 2021-11-04: 0.25 mg via INTRAVENOUS
  Filled 2021-11-04: qty 5

## 2021-11-04 MED ORDER — SODIUM CHLORIDE 0.9 % IV SOLN
150.0000 mg | Freq: Once | INTRAVENOUS | Status: AC
  Administered 2021-11-04: 150 mg via INTRAVENOUS
  Filled 2021-11-04: qty 150

## 2021-11-04 MED ORDER — SODIUM CHLORIDE 0.9 % IV SOLN
200.0000 mg | Freq: Once | INTRAVENOUS | Status: AC
  Administered 2021-11-04: 200 mg via INTRAVENOUS
  Filled 2021-11-04: qty 200

## 2021-11-04 MED ORDER — CETIRIZINE HCL 10 MG/ML IV SOLN
10.0000 mg | Freq: Once | INTRAVENOUS | Status: AC
  Administered 2021-11-04: 10 mg via INTRAVENOUS
  Filled 2021-11-04: qty 1

## 2021-11-04 MED ORDER — SODIUM CHLORIDE 0.9 % IV SOLN
10.0000 mg | Freq: Once | INTRAVENOUS | Status: AC
  Administered 2021-11-04: 10 mg via INTRAVENOUS
  Filled 2021-11-04: qty 10

## 2021-11-04 MED ORDER — SODIUM CHLORIDE 0.9 % IV SOLN
500.0000 mg/m2 | Freq: Once | INTRAVENOUS | Status: AC
  Administered 2021-11-04: 1100 mg via INTRAVENOUS
  Filled 2021-11-04: qty 40

## 2021-11-04 MED ORDER — SODIUM CHLORIDE 0.9 % IV SOLN: Freq: Once | INTRAVENOUS | Status: AC

## 2021-11-04 NOTE — Patient Instructions (Signed)
[facility]  Discharge Instructions: Thank you for choosing [facility] to provide your oncology and hematology care.   If you have a lab appointment with the [facility], please go directly to the [facility] and check in at the registration area.   Wear comfortable clothing and clothing appropriate for easy access to any Portacath or PICC line.   We strive to give you quality time with your provider. You may need to reschedule your appointment if you arrive late (15 or more minutes).  Arriving late affects you and other patients whose appointments are after yours.  Also, if you miss three or more appointments without notifying the office, you may be dismissed from the clinic at the provider's discretion.      For prescription refill requests, have your pharmacy contact our office and allow 72 hours for refills to be completed.    Today you received the following chemotherapy and/or immunotherapy agents: pembrolizumab, pemetrexed, carboplatin      To help prevent nausea and vomiting after your treatment, we encourage you to take your nausea medication as directed.  BELOW ARE SYMPTOMS THAT SHOULD BE REPORTED IMMEDIATELY: *FEVER GREATER THAN 100.4 F (38 C) OR HIGHER *CHILLS OR SWEATING *NAUSEA AND VOMITING THAT IS NOT CONTROLLED WITH YOUR NAUSEA MEDICATION *UNUSUAL SHORTNESS OF BREATH *UNUSUAL BRUISING OR BLEEDING *URINARY PROBLEMS (pain or burning when urinating, or frequent urination) *BOWEL PROBLEMS (unusual diarrhea, constipation, pain near the anus) TENDERNESS IN MOUTH AND THROAT WITH OR WITHOUT PRESENCE OF ULCERS (sore throat, sores in mouth, or a toothache) UNUSUAL RASH, SWELLING OR PAIN  UNUSUAL VAGINAL DISCHARGE OR ITCHING   Items with * indicate a potential emergency and should be followed up as soon as possible or go to the Emergency Department if any problems should occur.  Please show the CHEMOTHERAPY ALERT CARD or  IMMUNOTHERAPY ALERT CARD at check-in to the Emergency Department and triage nurse.  Should you have questions after your visit or need to cancel or reschedule your appointment, please contact [facility]  Dept: [phone]  and follow the prompts.  Office hours are 8:00 a.m. to 4:30 p.m. Monday - Friday. Please note that voicemails left after 4:00 p.m. may not be returned until the following business day.  We are closed weekends and major holidays. You have access to a nurse at all times for urgent questions. Please call the main number to the clinic Dept: [phone] and follow the prompts.   For any non-urgent questions, you may also contact your provider using MyChart. We now offer e-Visits for anyone 11 and older to request care online for non-urgent symptoms. For details visit [email or web address].   Also download the MyChart app! Go to the app store, search "MyChart", open the app, select [facility], and log in with your MyChart username and password.  Masks are optional in the cancer centers. If you would like for your care team to wear a mask while they are taking care of you, please let them know. You may have one support person who is at least 58 years old accompany you for your appointments.

## 2021-11-04 NOTE — Progress Notes (Signed)
[facility] [facility] [phone]   [phone]  OFFICE PROGRESS NOTE  [name], DO [place] [address]  DIAGNOSIS: stage IV (T2a, N2, M1c) non-small cell lung cancer, adenocarcinoma presented with right upper lobe lung mass in addition to right hilar and mediastinal lymphadenopathy and innumerable bilateral pulmonary nodules as well as liver and bone metastasis diagnosed in August 2023.   Detected Alteration(s) / Biomarker(s) Associated FDA-approved therapies Clinical Trial Availability % cfDNA or Amplification  KRAS G12C  approved by FDA Adagrasib, Sotorasib Yes 5.3%  IDH1 R132L  approved in other indication Ivosidenib, Olutasidenib Yes 1.9%  PD-L1 expression 89%  PRIOR THERAPY: None  CURRENT THERAPY: Systemic chemotherapy with carboplatin for AUC of 5, Alimta 500 Mg/M2 and Keytruda 200 Mg IV every 3 weeks.  First dose September 02, 2021.  Status post  3 cycles.   INTERVAL HISTORY: Cassidy [name] 56 y.o. female returns to the clinic today for follow-up visit.  The patient is feeling fine today with no concerning complaints except for occasional night sweats and insomnia.  She denied having any chest pain, shortness of breath, cough or hemoptysis.  She continues to smoke few cigarettes every day.  She denied having any nausea, vomiting, diarrhea or constipation.  She has no headache or visual changes.  She is here today for evaluation before starting cycle #4 of her treatment.   MEDICAL HISTORY: Past Medical History:  Diagnosis Date   Adenocarcinoma of right lung, stage 4 ([place]) 08/26/2021   Anemia    has sickle cell trait   Atherosclerotic heart disease of native coronary artery with angina pectoris ([place] [facility]) 05/2012, 10/2013   a.  s/p PCTA to dRCA and ostial RPAV-PLA vessel + PDA branch (05/2012)  b. [place] s/p DES to mLAD - Resolute DES 3.0 x 22 (3.4m -->3.3 mm)   Bipolar depression ([facility]    Breast abscess    a. right side.     COPD (chronic obstructive pulmonary disease) (HCC)    Depression    Diabetic peripheral neuropathy (HCC)    Fibromyalgia    Genital warts    GERD (gastroesophageal reflux disease)    History of hiatal hernia    HLD (hyperlipidemia)    Hypertension    Pain in limb    a. LE VENOUS DUPLEX, 02/05/2009 - no evidence of deep vein thrombosis, Baker's cyst   Pneumonia    PTSD (post-traumatic stress disorder)    Sickle cell trait (HCC)    Tobacco abuse    Tooth caries    Type II diabetes mellitus (HCC)     ALLERGIES:  is allergic to amoxil [amoxicillin], chantix [varenicline], suboxone [buprenorphine hcl-naloxone hcl], and emend [fosaprepitant dimeglumine].  MEDICATIONS:  Current Outpatient Medications  Medication Sig Dispense Refill   amLODipine (NORVASC) 5 MG tablet Take 1 tablet (5 mg total) by mouth at bedtime. 90 tablet 3   aspirin EC 81 MG tablet Take 81 mg by mouth daily.     atorvastatin (LIPITOR) 40 MG tablet TAKE 1 TABLET (40 MG TOTAL) BY MOUTH DAILY. 90 tablet 3   busPIRone (BUSPAR) 15 MG tablet TAKE 1/2 TABLET (7.5 MG TOTAL) BY MOUTH TWICE DAILY (Patient taking differently: Take 7.5 mg by mouth 2 (two) times daily.) 30 tablet 11   Carboxymethylcellul-Glycerin (LUBRICATING EYE DROPS OP) Place 1 drop into both eyes daily as needed (dry eyes).     carvedilol (COREG) 12.5 MG tablet TAKE 1 TABLET (12.5 MG TOTAL) BY MOUTH  2 (TWO) TIMES DAILY WITH A MEAL. 180 tablet 1   dexamethasone (DECADRON) 2 MG tablet Take 1 tablet (2 mg total) by mouth daily. 30 tablet 0   diclofenac Sodium (VOLTAREN) 1 % GEL Apply 1 application topically 3 times daily as needed for pain 2 g 3   diphenhydramine-acetaminophen (TYLENOL PM) 25-500 MG TABS tablet Take 1 tablet by mouth at bedtime as needed (sleep). (Patient not taking: Reported on 09/07/2021)     Dulaglutide (TRULICITY) 8.67 JQ/4.9EE SOPN Inject 0.75 mg into the skin once a week. (Patient not taking: Reported on 09/29/2021) 2 mL 0   ferrous sulfate 325  (65 FE) MG tablet Take 325 mg by mouth daily with breakfast.     fluticasone (FLONASE) 50 MCG/ACT nasal spray PLACE 1 SPRAY INTO BOTH NOSTRILS DAILY AS NEEDED (CONGESTION). 16 g 1   fluticasone-salmeterol (ADVAIR) 100-50 MCG/ACT AEPB Inhale 1 puff into the lungs 2 (two) times daily. 1 each 3   folic acid (FOLVITE) 1 MG tablet Take 1 tablet (1 mg total) by mouth daily. 30 tablet 3   furosemide (LASIX) 40 MG tablet Take 1 tablet (40 mg total) by mouth daily as needed. If you gain 3 lbs in 24 hours or 5 lbs in 1 week (Patient not taking: Reported on 09/07/2021) 30 tablet 1   glucose blood (ACCU-CHEK AVIVA PLUS) test strip Use as instructed 50 each 1   insulin detemir (LEVEMIR FLEXPEN) 100 UNIT/ML FlexPen Inject 10 Units into the skin daily. 15 mL 1   ipratropium (ATROVENT HFA) 17 MCG/ACT inhaler Inhale 2 puffs into the lungs every 4 (four) hours as needed for wheezing (COPD).     isosorbide mononitrate (IMDUR) 30 MG 24 hr tablet Take 30 mg by mouth daily.     LINZESS 290 MCG CAPS capsule TAKE 1 CAPSULE (290 MCG TOTAL) BY MOUTH DAILY BEFORE BREAKFAST. (Patient not taking: Reported on 09/07/2021) 30 capsule 3   losartan (COZAAR) 50 MG tablet Take 50 mg by mouth daily. (Patient not taking: Reported on 09/07/2021)     methocarbamol (ROBAXIN) 500 MG tablet Take 1 tablet (500 mg total) by mouth every 8 (eight) hours as needed for muscle spasms. (Patient not taking: Reported on 09/07/2021) 20 tablet 0   nicotine polacrilex (NICORETTE) 2 MG gum Take 1 each (2 mg total) by mouth as needed for smoking cessation. 100 tablet 0   nitroGLYCERIN (NITROSTAT) 0.4 MG SL tablet Place 1 tablet (0.4 mg total) under the tongue every 5 (five) minutes as needed for chest pain. 25 tablet 5   nystatin (MYCOSTATIN) 100000 UNIT/ML suspension Take 5 mLs (500,000 Units total) by mouth 4 (four) times daily. 60 mL 0   oxyCODONE-acetaminophen (PERCOCET) 5-325 MG tablet Take 1-2 tablets by mouth every 6 (six) hours as needed for severe pain.  60 tablet 0   pantoprazole (PROTONIX) 40 MG tablet TAKE 1 TABLET (40 MG TOTAL) BY MOUTH DAILY. 30 tablet 1   potassium chloride SA (KLOR-CON M) 20 MEQ tablet Take 1 tablet (20 mEq total) by mouth daily as needed. On days you take Lasix (Patient not taking: Reported on 09/07/2021) 30 tablet 1   prochlorperazine (COMPAZINE) 10 MG tablet Take 1 tablet (10 mg total) by mouth every 6 (six) hours as needed for nausea or vomiting. 30 tablet 0   sertraline (ZOLOFT) 50 MG tablet TAKE 1 TABLET (50 MG TOTAL) BY MOUTH DAILY. 30 tablet 3   valACYclovir (VALTREX) 1000 MG tablet TAKE FOR 3 DAYS AS NEEDED FOR EACH OUTBREAK  30 tablet 11   vitamin B-12 (CYANOCOBALAMIN) 1000 MCG tablet Take 1,000 mcg by mouth daily.     No current facility-administered medications for this visit.    SURGICAL HISTORY:  Past Surgical History:  Procedure Laterality Date   BLADDER SURGERY  1980   "TVT"   BLADDER SURGERY  2003   "TVT"   BREAST EXCISIONAL BIOPSY     BREAST SURGERY Right    I&D for multiple abscesses   CARDIAC CATHETERIZATION     CORONARY ARTERY BYPASS GRAFT N/A 06/30/2021   Procedure: CORONARY ARTERY BYPASS GRAFTING (CABG) x3 USING LEFT INTERNAL MAMMARY ARTERY,  RIGHT RADIAL ARTERY AND LEFT GREATER SAPHENOUS VEIN;  Surgeon: Lajuana Matte, MD;  Location: [facility];  Service: Open Heart Surgery;  Laterality: N/A;   cutting balloon     ENDOVEIN HARVEST OF GREATER SAPHENOUS VEIN Left 06/30/2021   Procedure: ENDOVEIN HARVEST OF GREATER SAPHENOUS VEIN;  Surgeon: Lajuana Matte, MD;  Location: [facility];  Service: Open Heart Surgery;  Laterality: Left;   EYE SURGERY Left    laser surgery   FINE NEEDLE ASPIRATION  08/21/2021   Procedure: FINE NEEDLE ASPIRATION (FNA) LINEAR;  Surgeon: Garner Nash, DO;  Location: [facility];  Service: Pulmonary;;   INCISE AND DRAIN ABCESS  ; 04/26/11; 08/13/11   right breast   INCISION AND DRAINAGE ABSCESS Right 05/09/2012   Procedure: INCISION AND DRAINAGE RIGHT BREAST ABSCESS;   Surgeon: Imogene Burn. Georgette Dover, MD;  Location: [facility];  Service: General;  Laterality: Right;   INCISION AND DRAINAGE ABSCESS Right 02/08/2014   Procedure: INCISION AND DRAINAGE RIGHT BREAST ABSCESS;  Surgeon: Donnie Mesa, MD;  Location: [facility];  Service: General;  Laterality: Right;   INCISION AND DRAINAGE ABSCESS Right 06/14/2014   Procedure: INCISION AND DRAINAGE RIGHT BREAST ABSCESS;  Surgeon: Donnie Mesa, MD;  Location: [facility];  Service: General;  Laterality: Right;   IRRIGATION AND DEBRIDEMENT ABSCESS  12/19/2010   Procedure: IRRIGATION AND DEBRIDEMENT ABSCESS;  Surgeon: Rolm Bookbinder, MD;  Location: [facility];  Service: General;  Laterality: Right;   IRRIGATION AND DEBRIDEMENT ABSCESS  08/13/2011   Procedure: MINOR INCISION AND DRAINAGE OF ABSCESS;  Surgeon: Gwenyth Ober, MD;  Location: [facility];  Service: General;  Laterality: Right;  Right Breast    IRRIGATION AND DEBRIDEMENT ABSCESS  11/17/2011   Procedure: IRRIGATION AND DEBRIDEMENT ABSCESS;  Surgeon: Imogene Burn. Georgette Dover, MD;  Location: [facility];  Service: General;  Laterality: Right;  irrigation and debridement right recurrent breast abscess   LARYNX SURGERY     LEFT HEART CATH AND CORONARY ANGIOGRAPHY N/A 04/09/2021   Procedure: LEFT HEART CATH AND CORONARY ANGIOGRAPHY;  Surgeon: Jettie Booze, MD;  Location: [facility];  Service: Cardiovascular;  Laterality: N/A;   LEFT HEART CATHETERIZATION WITH CORONARY ANGIOGRAM N/A 05/29/2012   Procedure: LEFT HEART CATHETERIZATION WITH CORONARY ANGIOGRAM & PTCA;  Surgeon: Troy Sine, MD;  Location: [facility];  Service: Cardiovascular; Bifurcation dRCA-RPAD/PLA & rPDA  PTCA.   LEFT HEART CATHETERIZATION WITH CORONARY ANGIOGRAM N/A 11/08/2013   Procedure: LEFT HEART CATHETERIZATION WITH CORONARY ANGIOGRAM and Coronary Stent Intervention;  Surgeon: Leonie Man, MD;  Location: [facility] LAB;  Service: Cardiovascular; mLAD 80% (FFR 0.73) - resolute DES 3.0 x 22 mm (postdilated to 3.3  mm->3.5 mm)   NM MYOVIEW LTD  01/26/2009   Normal study, no evidence of ischemia, EF 67%   ployp removed from voice box 03/30/12     RADIAL  ARTERY HARVEST Right 06/30/2021   Procedure: RADIAL ARTERY HARVEST;  Surgeon: Lajuana Matte, MD;  Location: [facility] [place];  Service: Open Heart Surgery;  Laterality: Right;   REFRACTIVE SURGERY  ~ 2010   right   TEE WITHOUT CARDIOVERSION N/A 06/30/2021   Procedure: TRANSESOPHAGEAL ECHOCARDIOGRAM (TEE);  Surgeon: Lajuana Matte, MD;  Location: [facility];  Service: Open Heart Surgery;  Laterality: N/A;   TONSILLECTOMY  1988   VIDEO BRONCHOSCOPY WITH ENDOBRONCHIAL ULTRASOUND Bilateral 08/21/2021   Procedure: VIDEO BRONCHOSCOPY WITH ENDOBRONCHIAL ULTRASOUND;  Surgeon: Garner Nash, DO;  Location: [facility];  Service: Pulmonary;  Laterality: Bilateral;    REVIEW OF SYSTEMS:  A comprehensive review of systems was negative except for: Constitutional: positive for fatigue and night sweats   PHYSICAL EXAMINATION: General appearance: alert, cooperative, fatigued, and no distress Head: Normocephalic, without obvious abnormality, atraumatic Neck: no adenopathy, no JVD, supple, symmetrical, trachea midline, and thyroid not enlarged, symmetric, no tenderness/mass/nodules Lymph nodes: Cervical, supraclavicular, and axillary nodes normal. Resp: clear to auscultation bilaterally Back: symmetric, no curvature. ROM normal. No CVA tenderness. Cardio: regular rate and rhythm, S1, S2 normal, no murmur, click, rub or gallop GI: soft, non-tender; bowel sounds normal; no masses,  no organomegaly Extremities: extremities normal, atraumatic, no cyanosis or edema  ECOG PERFORMANCE STATUS: 1 - Symptomatic but completely ambulatory  Blood pressure 100/64, pulse 76, temperature 97.7 F (36.5 C), temperature source Oral, resp. rate 18, height 5' 0.75" (1.543 m), weight 206 lb 3 oz (93.5 kg), last menstrual period 06/12/2016.  LABORATORY DATA: Lab Results  Component Value  Date   WBC 3.3 (L) 11/04/2021   HGB 11.1 (L) 11/04/2021   HCT 31.7 (L) 11/04/2021   MCV 86.1 11/04/2021   PLT 204 11/04/2021      Chemistry      Component Value Date/Time   NA 136 11/04/2021 1041   NA 140 04/03/2021 1059   K 4.0 11/04/2021 1041   CL 104 11/04/2021 1041   CO2 24 11/04/2021 1041   BUN 12 11/04/2021 1041   BUN 13 04/03/2021 1059   CREATININE 0.75 11/04/2021 1041   CREATININE 0.65 11/06/2013 1520      Component Value Date/Time   CALCIUM 8.8 (L) 11/04/2021 1041   ALKPHOS 84 11/04/2021 1041   AST 13 (L) 11/04/2021 1041   ALT 15 11/04/2021 1041   BILITOT 0.6 11/04/2021 1041       RADIOGRAPHIC STUDIES: No results found.  ASSESSMENT AND PLAN: This is a very pleasant 56 years old African-American female recently diagnosed with a stage IV (T2 a, N2, M1 C) non-small cell lung cancer, adenocarcinoma presented with right upper lobe lung mass in addition to right hilar and mediastinal lymphadenopathy as well as innumerable bilateral pulmonary nodules and bone and liver metastasis diagnosed in August 2023 with positive KRAS G12C mutation and PD-L1 expression of 89%. She is currently undergoing systemic chemotherapy with carboplatin for AUC of 5, Alimta 500 Mg/M2 and Keytruda 200 Mg IV every 3 weeks status post 3 cycles.  The patient has been tolerating her treatment well with no concerning adverse effect except for mild fatigue and night sweats. I recommended for her to proceed with cycle #4 today as planned. I will see her back for follow-up visit in 3 weeks for evaluation with repeat CT scan of the chest, abdomen and pelvis for restaging of her disease. For the back pain, she is currently on Norco every 8 hours.  She is followed by the palliative care team.  For the bone metastasis, I completed palliative radiotherapy to the painful bone lesions. The patient was advised to call immediately if she has any concerning symptoms in the interval.  The patient voices  understanding of current disease status and treatment options and is in agreement with the current care plan.  All questions were answered. The patient knows to call the clinic with any problems, questions or concerns. We can certainly see the patient much sooner if necessary.  Disclaimer: This note was dictated with voice recognition software. Similar sounding words can inadvertently be transcribed and may not be corrected upon review.

## 2021-11-11 ENCOUNTER — Inpatient Hospital Stay (HOSPITAL_BASED_OUTPATIENT_CLINIC_OR_DEPARTMENT_OTHER): Payer: Medicaid Other | Admitting: Nurse Practitioner

## 2021-11-11 ENCOUNTER — Other Ambulatory Visit: Payer: Self-pay

## 2021-11-11 ENCOUNTER — Inpatient Hospital Stay: Payer: Medicaid Other | Attending: Internal Medicine

## 2021-11-11 ENCOUNTER — Other Ambulatory Visit: Payer: Self-pay | Admitting: Internal Medicine

## 2021-11-11 ENCOUNTER — Encounter: Payer: Self-pay | Admitting: Nurse Practitioner

## 2021-11-11 VITALS — BP 131/68 | HR 73 | Temp 97.8°F | Resp 16 | Wt 207.2 lb

## 2021-11-11 DIAGNOSIS — I2510 Atherosclerotic heart disease of native coronary artery without angina pectoris: Secondary | ICD-10-CM | POA: Insufficient documentation

## 2021-11-11 DIAGNOSIS — Z7951 Long term (current) use of inhaled steroids: Secondary | ICD-10-CM | POA: Diagnosis not present

## 2021-11-11 DIAGNOSIS — E119 Type 2 diabetes mellitus without complications: Secondary | ICD-10-CM | POA: Diagnosis not present

## 2021-11-11 DIAGNOSIS — Z794 Long term (current) use of insulin: Secondary | ICD-10-CM | POA: Diagnosis not present

## 2021-11-11 DIAGNOSIS — R110 Nausea: Secondary | ICD-10-CM

## 2021-11-11 DIAGNOSIS — C7801 Secondary malignant neoplasm of right lung: Secondary | ICD-10-CM | POA: Insufficient documentation

## 2021-11-11 DIAGNOSIS — Z5111 Encounter for antineoplastic chemotherapy: Secondary | ICD-10-CM | POA: Diagnosis present

## 2021-11-11 DIAGNOSIS — G893 Neoplasm related pain (acute) (chronic): Secondary | ICD-10-CM

## 2021-11-11 DIAGNOSIS — Z515 Encounter for palliative care: Secondary | ICD-10-CM | POA: Diagnosis not present

## 2021-11-11 DIAGNOSIS — Z5112 Encounter for antineoplastic immunotherapy: Secondary | ICD-10-CM | POA: Diagnosis present

## 2021-11-11 DIAGNOSIS — Z923 Personal history of irradiation: Secondary | ICD-10-CM | POA: Insufficient documentation

## 2021-11-11 DIAGNOSIS — D573 Sickle-cell trait: Secondary | ICD-10-CM | POA: Diagnosis not present

## 2021-11-11 DIAGNOSIS — Z880 Allergy status to penicillin: Secondary | ICD-10-CM | POA: Insufficient documentation

## 2021-11-11 DIAGNOSIS — C7951 Secondary malignant neoplasm of bone: Secondary | ICD-10-CM | POA: Insufficient documentation

## 2021-11-11 DIAGNOSIS — F17210 Nicotine dependence, cigarettes, uncomplicated: Secondary | ICD-10-CM | POA: Insufficient documentation

## 2021-11-11 DIAGNOSIS — K219 Gastro-esophageal reflux disease without esophagitis: Secondary | ICD-10-CM | POA: Insufficient documentation

## 2021-11-11 DIAGNOSIS — M2550 Pain in unspecified joint: Secondary | ICD-10-CM | POA: Diagnosis not present

## 2021-11-11 DIAGNOSIS — Z86018 Personal history of other benign neoplasm: Secondary | ICD-10-CM | POA: Insufficient documentation

## 2021-11-11 DIAGNOSIS — C3411 Malignant neoplasm of upper lobe, right bronchus or lung: Secondary | ICD-10-CM | POA: Insufficient documentation

## 2021-11-11 DIAGNOSIS — K5903 Drug induced constipation: Secondary | ICD-10-CM

## 2021-11-11 DIAGNOSIS — R5383 Other fatigue: Secondary | ICD-10-CM | POA: Diagnosis not present

## 2021-11-11 DIAGNOSIS — C3491 Malignant neoplasm of unspecified part of right bronchus or lung: Secondary | ICD-10-CM

## 2021-11-11 DIAGNOSIS — C787 Secondary malignant neoplasm of liver and intrahepatic bile duct: Secondary | ICD-10-CM | POA: Diagnosis not present

## 2021-11-11 DIAGNOSIS — C7802 Secondary malignant neoplasm of left lung: Secondary | ICD-10-CM | POA: Diagnosis not present

## 2021-11-11 DIAGNOSIS — Z7189 Other specified counseling: Secondary | ICD-10-CM

## 2021-11-11 DIAGNOSIS — I10 Essential (primary) hypertension: Secondary | ICD-10-CM | POA: Diagnosis not present

## 2021-11-11 DIAGNOSIS — I700 Atherosclerosis of aorta: Secondary | ICD-10-CM | POA: Diagnosis not present

## 2021-11-11 DIAGNOSIS — Z79899 Other long term (current) drug therapy: Secondary | ICD-10-CM | POA: Diagnosis not present

## 2021-11-11 DIAGNOSIS — M549 Dorsalgia, unspecified: Secondary | ICD-10-CM | POA: Diagnosis not present

## 2021-11-11 DIAGNOSIS — R530 Neoplastic (malignant) related fatigue: Secondary | ICD-10-CM

## 2021-11-11 DIAGNOSIS — Z8774 Personal history of (corrected) congenital malformations of heart and circulatory system: Secondary | ICD-10-CM | POA: Insufficient documentation

## 2021-11-11 DIAGNOSIS — K5900 Constipation, unspecified: Secondary | ICD-10-CM | POA: Diagnosis not present

## 2021-11-11 LAB — COMPREHENSIVE METABOLIC PANEL
ALT: 11 U/L (ref 0–44)
AST: 11 U/L — ABNORMAL LOW (ref 15–41)
Albumin: 4.1 g/dL (ref 3.5–5.0)
Alkaline Phosphatase: 88 U/L (ref 38–126)
Anion gap: 6 (ref 5–15)
BUN: 14 mg/dL (ref 6–20)
CO2: 27 mmol/L (ref 22–32)
Calcium: 9.1 mg/dL (ref 8.9–10.3)
Chloride: 104 mmol/L (ref 98–111)
Creatinine, Ser: 0.72 mg/dL (ref 0.44–1.00)
GFR, Estimated: >60 mL/min (ref >60)
Glucose, Bld: 238 mg/dL — ABNORMAL HIGH (ref 70–99)
Potassium: 3.9 mmol/L (ref 3.5–5.1)
Sodium: 137 mmol/L (ref 135–145)
Total Bilirubin: 0.5 mg/dL (ref 0.3–1.2)
Total Protein: 7.2 g/dL (ref 6.5–8.1)

## 2021-11-11 LAB — CBC WITH DIFFERENTIAL/PLATELET
Abs Immature Granulocytes: 0 10*3/uL (ref 0.00–0.07)
Basophils Absolute: 0 10*3/uL (ref 0.0–0.1)
Basophils Relative: 1 %
Eosinophils Absolute: 0 10*3/uL (ref 0.0–0.5)
Eosinophils Relative: 3 %
HCT: 29.2 % — ABNORMAL LOW (ref 36.0–46.0)
Hemoglobin: 10.1 g/dL — ABNORMAL LOW (ref 12.0–15.0)
Immature Granulocytes: 0 %
Lymphocytes Relative: 38 %
Lymphs Abs: 0.4 10*3/uL — ABNORMAL LOW (ref 0.7–4.0)
MCH: 29.5 pg (ref 26.0–34.0)
MCHC: 34.6 g/dL (ref 30.0–36.0)
MCV: 85.4 fL (ref 80.0–100.0)
Monocytes Absolute: 0.2 10*3/uL (ref 0.1–1.0)
Monocytes Relative: 13 %
Neutro Abs: 0.5 10*3/uL — ABNORMAL LOW (ref 1.7–7.7)
Neutrophils Relative %: 45 %
Platelets: 139 10*3/uL — ABNORMAL LOW (ref 150–400)
RBC: 3.42 MIL/uL — ABNORMAL LOW (ref 3.87–5.11)
RDW: 18.1 % — ABNORMAL HIGH (ref 11.5–15.5)
WBC: 1.2 10*3/uL — ABNORMAL LOW (ref 4.0–10.5)
nRBC: 0 % (ref 0.0–0.2)

## 2021-11-11 MED ORDER — OXYCODONE HCL 5 MG PO TABS: 5.0000 mg | ORAL_TABLET | Freq: Four times a day (QID) | ORAL | 0 refills | Status: DC | PRN

## 2021-11-11 MED ORDER — ONDANSETRON HCL 8 MG PO TABS: 8.0000 mg | ORAL_TABLET | Freq: Three times a day (TID) | ORAL | 1 refills | Status: DC | PRN

## 2021-11-11 MED ORDER — XTAMPZA ER 9 MG PO C12A: 9.0000 mg | EXTENDED_RELEASE_CAPSULE | Freq: Two times a day (BID) | ORAL | 0 refills | Status: DC

## 2021-11-11 NOTE — Patient Instructions (Signed)
-   pick up and take 1 oxycodone ER (xtampza) and take every 12 hours for extended pain relief  - stop taking percocet  - pick up and take 1 oxycodone IR (immediate release) every 4-6 hours as needed for breakthrough pain - continue to take your laxative as needed for constipation. Do not go 2 days without a BM, if you are having diarrhea do not take  - continue to take (prochlorperazine) compazine every 6 hours, pick up and take ondansetron (zofran) and take every 8 hours alternating between compazine and zofran for more coverage. (Compazine at 12pm, zofran at 3pm, compazine again at 6pm, zofran again at 11pm etc. As needed for nausea) - as always with starting a new medication if you have difficulty breathing stop taking and call 911, if you develop a rash stop taking and call the office [phone]

## 2021-11-11 NOTE — Progress Notes (Signed)
[place]  [phone]) [phone] [phone]   Name: Cassidy [name] Date: 11/11/2021 MRN: [id number]  DOB: 1965-08-17  Patient Care Team: [name], DO as PCP - General (Family Medicine) O'Neal, Cassie Freer, MD as PCP - Cardiology (Cardiology) Rolm Bookbinder, MD as Consulting Physician (General Surgery)    REASON FOR CONSULTATION: Cassidy [name] is a 56 y.o. 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  Palliative ask to see for symptom management and goals of care.    SOCIAL HISTORY:     reports that she has been smoking cigarettes. She started smoking about 42 years ago. She has a 21.00 pack-year smoking history. She has never used smokeless tobacco. She reports that she does not currently use drugs. She reports that she does not drink alcohol.  ADVANCE DIRECTIVES:    CODE STATUS:   PAST MEDICAL HISTORY: Past Medical History:  Diagnosis Date   Adenocarcinoma of right lung, stage 4 ([place]) 08/26/2021   Anemia    has sickle cell trait   Atherosclerotic heart disease of native coronary artery with angina pectoris ([place]) 05/2012, 10/2013   a.  s/p PCTA to dRCA and ostial RPAV-PLA vessel + PDA branch (05/2012)  b. [place] s/p DES to mLAD - Resolute DES 3.0 x 22 (3.4mm -->3.3 mm)   Bipolar depression ([place])    Breast abscess    a. right side.    COPD (chronic obstructive pulmonary disease) (HCC)    Depression    Diabetic peripheral neuropathy (HCC)    Fibromyalgia    Genital warts    GERD (gastroesophageal reflux disease)    History of hiatal hernia    HLD (hyperlipidemia)    Hypertension    Pain in limb    a. LE VENOUS DUPLEX, 02/05/2009 - no evidence of deep vein thrombosis, Baker's cyst   Pneumonia    PTSD (post-traumatic stress disorder)     Sickle cell trait ([place])    Tobacco abuse    Tooth caries    Type II diabetes mellitus ([place])     PAST SURGICAL HISTORY:  Past Surgical History:  Procedure Laterality Date   BLADDER SURGERY  1980   "TVT"   BLADDER SURGERY  2003   "TVT"   BREAST EXCISIONAL BIOPSY     BREAST SURGERY Right    I&D for multiple abscesses   CARDIAC CATHETERIZATION     CORONARY ARTERY BYPASS GRAFT N/A 06/30/2021   Procedure: CORONARY ARTERY BYPASS GRAFTING (CABG) x3 USING LEFT INTERNAL MAMMARY ARTERY,  RIGHT RADIAL ARTERY AND LEFT GREATER SAPHENOUS VEIN;  Surgeon: Lajuana Matte, MD;  Location: [place];  Service: Open Heart Surgery;  Laterality: N/A;   cutting balloon     ENDOVEIN HARVEST OF GREATER SAPHENOUS VEIN Left 06/30/2021   Procedure: ENDOVEIN HARVEST OF GREATER SAPHENOUS VEIN;  Surgeon: Lajuana Matte, MD;  Location: [place];  Service: Open Heart Surgery;  Laterality: Left;   EYE SURGERY Left    laser surgery   FINE NEEDLE ASPIRATION  08/21/2021   Procedure: FINE NEEDLE ASPIRATION (FNA) LINEAR;  Surgeon: Garner Nash, DO;  Location: [facility];  Service: Pulmonary;;   INCISE AND DRAIN ABCESS  ; 04/26/11; 08/13/11   right breast   INCISION AND DRAINAGE ABSCESS Right 05/09/2012   Procedure: INCISION AND DRAINAGE RIGHT BREAST ABSCESS;  Surgeon: Imogene Burn. Tsuei, MD;  Location: [place];  Service: General;  Laterality: Right;   INCISION AND DRAINAGE ABSCESS Right 02/08/2014   Procedure: INCISION AND DRAINAGE RIGHT BREAST ABSCESS;  Surgeon: Donnie Mesa, MD;  Location: [place];  Service: General;  Laterality: Right;   INCISION AND DRAINAGE ABSCESS Right 06/14/2014   Procedure: INCISION AND DRAINAGE RIGHT BREAST ABSCESS;  Surgeon: Donnie Mesa, MD;  Location: [place];  Service: General;  Laterality: Right;   IRRIGATION AND DEBRIDEMENT ABSCESS  12/19/2010   Procedure: IRRIGATION AND DEBRIDEMENT ABSCESS;  Surgeon: Rolm Bookbinder, MD;  Location: [facility];  Service: General;  Laterality: Right;    IRRIGATION AND DEBRIDEMENT ABSCESS  08/13/2011   Procedure: MINOR INCISION AND DRAINAGE OF ABSCESS;  Surgeon: Gwenyth Ober, MD;  Location: [place];  Service: General;  Laterality: Right;  Right Breast    IRRIGATION AND DEBRIDEMENT ABSCESS  11/17/2011   Procedure: IRRIGATION AND DEBRIDEMENT ABSCESS;  Surgeon: Imogene Burn. Georgette Dover, MD;  Location: [place];  Service: General;  Laterality: Right;  irrigation and debridement right recurrent breast abscess   LARYNX SURGERY     LEFT HEART CATH AND CORONARY ANGIOGRAPHY N/A 04/09/2021   Procedure: LEFT HEART CATH AND CORONARY ANGIOGRAPHY;  Surgeon: Jettie Booze, MD;  Location: [place] [facility];  Service: Cardiovascular;  Laterality: N/A;   LEFT HEART CATHETERIZATION WITH CORONARY ANGIOGRAM N/A 05/29/2012   Procedure: LEFT HEART CATHETERIZATION WITH CORONARY ANGIOGRAM & PTCA;  Surgeon: Troy Sine, MD;  Location: [facility] CATH LAB;  Service: Cardiovascular; Bifurcation dRCA-RPAD/PLA & rPDA  PTCA.   LEFT HEART CATHETERIZATION WITH CORONARY ANGIOGRAM N/A 11/08/2013   Procedure: LEFT HEART CATHETERIZATION WITH CORONARY ANGIOGRAM and Coronary Stent Intervention;  Surgeon: Leonie Man, MD;  Location: [facility] CATH LAB;  Service: Cardiovascular; mLAD 80% (FFR 0.73) - resolute DES 3.0 x 22 mm (postdilated to 3.3 mm->3.5 mm)   NM MYOVIEW LTD  01/26/2009   Normal study, no evidence of ischemia, EF 67%   ployp removed from voice box 03/30/12     RADIAL ARTERY HARVEST Right 06/30/2021   Procedure: RADIAL ARTERY HARVEST;  Surgeon: Lajuana Matte, MD;  Location: [place];  Service: Open Heart Surgery;  Laterality: Right;   REFRACTIVE SURGERY  ~ 2010   right   TEE WITHOUT CARDIOVERSION N/A 06/30/2021   Procedure: TRANSESOPHAGEAL ECHOCARDIOGRAM (TEE);  Surgeon: Lajuana Matte, MD;  Location: [place];  Service: Open Heart Surgery;  Laterality: N/A;   TONSILLECTOMY  1988   VIDEO BRONCHOSCOPY WITH ENDOBRONCHIAL ULTRASOUND Bilateral 08/21/2021   Procedure: VIDEO  BRONCHOSCOPY WITH ENDOBRONCHIAL ULTRASOUND;  Surgeon: Garner Nash, DO;  Location: [place];  Service: Pulmonary;  Laterality: Bilateral;    HEMATOLOGY/ONCOLOGY HISTORY:  Oncology History  Adenocarcinoma of right lung, stage 4 ([place])  08/26/2021 Initial Diagnosis   Adenocarcinoma of right lung, stage 4 ([place])   09/02/2021 -  Chemotherapy   Patient is on Treatment Plan : LUNG Carboplatin (5) + Pemetrexed (500) + Pembrolizumab (200) D1 q21d Induction x 4 cycles / Maintenance Pemetrexed (500) + Pembrolizumab (200) D1 q21d     09/02/2021 Cancer Staging   Staging form: Lung, AJCC 8th Edition - Clinical: Stage IVB (cT2a, cN2, cM1c) - Signed by Curt Bears, MD on 09/02/2021     ALLERGIES:  is allergic to amoxil [amoxicillin], chantix [varenicline], suboxone [buprenorphine hcl-naloxone hcl], and emend [fosaprepitant dimeglumine].  MEDICATIONS:  Current Outpatient Medications  Medication Sig Dispense Refill   amLODipine (NORVASC) 5 MG tablet Take 1 tablet (5 mg total) by mouth at  bedtime. 90 tablet 3   aspirin EC 81 MG tablet Take 81 mg by mouth daily.     atorvastatin (LIPITOR) 40 MG tablet TAKE 1 TABLET (40 MG TOTAL) BY MOUTH DAILY. 90 tablet 3   busPIRone (BUSPAR) 15 MG tablet TAKE 1/2 TABLET (7.5 MG TOTAL) BY MOUTH TWICE DAILY (Patient taking differently: Take 7.5 mg by mouth 2 (two) times daily.) 30 tablet 11   Carboxymethylcellul-Glycerin (LUBRICATING EYE DROPS OP) Place 1 drop into both eyes daily as needed (dry eyes).     carvedilol (COREG) 12.5 MG tablet TAKE 1 TABLET (12.5 MG TOTAL) BY MOUTH 2 (TWO) TIMES DAILY WITH A MEAL. 180 tablet 1   dexamethasone (DECADRON) 2 MG tablet Take 1 tablet (2 mg total) by mouth daily. 30 tablet 0   diclofenac Sodium (VOLTAREN) 1 % GEL Apply 1 application topically 3 times daily as needed for pain 2 g 3   diphenhydramine-acetaminophen (TYLENOL PM) 25-500 MG TABS tablet Take 1 tablet by mouth at bedtime as needed (sleep). (Patient not taking:  Reported on 09/07/2021)     Dulaglutide (TRULICITY) 0.53 ZJ/6.7HA SOPN Inject 0.75 mg into the skin once a week. (Patient not taking: Reported on 09/29/2021) 2 mL 0   ferrous sulfate 325 (65 FE) MG tablet Take 325 mg by mouth daily with breakfast.     fluticasone (FLONASE) 50 MCG/ACT nasal spray PLACE 1 SPRAY INTO BOTH NOSTRILS DAILY AS NEEDED (CONGESTION). 16 g 1   fluticasone-salmeterol (ADVAIR) 100-50 MCG/ACT AEPB Inhale 1 puff into the lungs 2 (two) times daily. 1 each 3   folic acid (FOLVITE) 1 MG tablet Take 1 tablet (1 mg total) by mouth daily. 30 tablet 3   furosemide (LASIX) 40 MG tablet Take 1 tablet (40 mg total) by mouth daily as needed. If you gain 3 lbs in 24 hours or 5 lbs in 1 week (Patient not taking: Reported on 09/07/2021) 30 tablet 1   glucose blood (ACCU-CHEK AVIVA PLUS) test strip Use as instructed 50 each 1   insulin detemir (LEVEMIR FLEXPEN) 100 UNIT/ML FlexPen Inject 10 Units into the skin daily. 15 mL 1   ipratropium (ATROVENT HFA) 17 MCG/ACT inhaler Inhale 2 puffs into the lungs every 4 (four) hours as needed for wheezing (COPD).     isosorbide mononitrate (IMDUR) 30 MG 24 hr tablet Take 30 mg by mouth daily.     LINZESS 290 MCG CAPS capsule TAKE 1 CAPSULE (290 MCG TOTAL) BY MOUTH DAILY BEFORE BREAKFAST. (Patient not taking: Reported on 09/07/2021) 30 capsule 3   losartan (COZAAR) 50 MG tablet Take 50 mg by mouth daily. (Patient not taking: Reported on 09/07/2021)     methocarbamol (ROBAXIN) 500 MG tablet Take 1 tablet (500 mg total) by mouth every 8 (eight) hours as needed for muscle spasms. (Patient not taking: Reported on 09/07/2021) 20 tablet 0   nicotine polacrilex (NICORETTE) 2 MG gum Take 1 each (2 mg total) by mouth as needed for smoking cessation. 100 tablet 0   nitroGLYCERIN (NITROSTAT) 0.4 MG SL tablet Place 1 tablet (0.4 mg total) under the tongue every 5 (five) minutes as needed for chest pain. 25 tablet 5   nystatin (MYCOSTATIN) 100000 UNIT/ML suspension Take 5 mLs  (500,000 Units total) by mouth 4 (four) times daily. 60 mL 0   oxyCODONE-acetaminophen (PERCOCET) 5-325 MG tablet Take 1-2 tablets by mouth every 6 (six) hours as needed for severe pain. 60 tablet 0   pantoprazole (PROTONIX) 40 MG tablet TAKE 1 TABLET (40 MG  TOTAL) BY MOUTH DAILY. 30 tablet 1   potassium chloride SA (KLOR-CON M) 20 MEQ tablet Take 1 tablet (20 mEq total) by mouth daily as needed. On days you take Lasix (Patient not taking: Reported on 09/07/2021) 30 tablet 1   prochlorperazine (COMPAZINE) 10 MG tablet Take 1 tablet (10 mg total) by mouth every 6 (six) hours as needed for nausea or vomiting. 30 tablet 0   sertraline (ZOLOFT) 50 MG tablet TAKE 1 TABLET (50 MG TOTAL) BY MOUTH DAILY. 30 tablet 3   valACYclovir (VALTREX) 1000 MG tablet TAKE FOR 3 DAYS AS NEEDED FOR EACH OUTBREAK 30 tablet 11   vitamin B-12 (CYANOCOBALAMIN) 1000 MCG tablet Take 1,000 mcg by mouth daily.     No current facility-administered medications for this visit.    VITAL SIGNS: BP 131/68 (Patient Position: Sitting)   Pulse 73   Temp 97.8 F (36.6 C) (Oral)   Resp 16   Wt 207 lb 4 oz (94 kg)   LMP 06/12/2016 (Approximate)   SpO2 100%   BMI 39.48 kg/m  Filed Weights   11/11/21 1339  Weight: 207 lb 4 oz (94 kg)    Estimated body mass index is 39.48 kg/m as calculated from the following:   Height as of 11/04/21: 5' 0.75" (1.543 m).   Weight as of this encounter: 207 lb 4 oz (94 kg).  LABS: CBC:    Component Value Date/Time   WBC 1.2 (L) 11/11/2021 1327   HGB 10.1 (L) 11/11/2021 1327   HGB 11.1 (L) 11/04/2021 1041   HGB 13.1 04/03/2021 1059   HCT 29.2 (L) 11/11/2021 1327   HCT 38.8 04/03/2021 1059   PLT 139 (L) 11/11/2021 1327   PLT 204 11/04/2021 1041   PLT 305 04/03/2021 1059   MCV 85.4 11/11/2021 1327   MCV 90 04/03/2021 1059   NEUTROABS 0.5 (L) 11/11/2021 1327   LYMPHSABS 0.4 (L) 11/11/2021 1327   MONOABS 0.2 11/11/2021 1327   EOSABS 0.0 11/11/2021 1327   BASOSABS 0.0 11/11/2021 1327    Comprehensive Metabolic Panel:    Component Value Date/Time   NA 137 11/11/2021 1327   NA 140 04/03/2021 1059   K 3.9 11/11/2021 1327   CL 104 11/11/2021 1327   CO2 27 11/11/2021 1327   BUN 14 11/11/2021 1327   BUN 13 04/03/2021 1059   CREATININE 0.72 11/11/2021 1327   CREATININE 0.75 11/04/2021 1041   CREATININE 0.65 11/06/2013 1520   GLUCOSE 238 (H) 11/11/2021 1327   CALCIUM 9.1 11/11/2021 1327   AST 11 (L) 11/11/2021 1327   AST 13 (L) 11/04/2021 1041   ALT 11 11/11/2021 1327   ALT 15 11/04/2021 1041   ALKPHOS 88 11/11/2021 1327   BILITOT 0.5 11/11/2021 1327   BILITOT 0.6 11/04/2021 1041   PROT 7.2 11/11/2021 1327   ALBUMIN 4.1 11/11/2021 1327    RADIOGRAPHIC STUDIES: No results found.  PERFORMANCE STATUS (ECOG) : 1 - Symptomatic but completely ambulatory  Review of Systems  Constitutional:  Positive for fatigue.  Musculoskeletal:  Positive for arthralgias and back pain.  Unless otherwise noted, a complete review of systems is negative.  Physical Exam General: NAD, fatigue Cardiovascular: regular rate and rhythm Pulmonary: Normal breathing pattern Extremities: no edema, no joint deformities Skin: no rashes Neurological: AAOx3, mood appropriate  IMPRESSION: This is my initial visit with Ms. Shon Baton.  She presented to the clinic today alone.  Ambulatory without assistive devices.  No acute distress noted.  Patient is alert and able to  engage in discussions appropriately.  I introduced myself, [place] RN, and Palliative's role in collaboration with the oncology team. Concept of Palliative Care was introduced as specialized medical care for people and their families living with serious illness.  It focuses on providing relief from the symptoms and stress of a serious illness.  The goal is to improve quality of life for both the patient and the family. Values and goals of care important to patient and family were attempted to be elicited.   Ms. Faley lives in the home  alone.  She has a cat.  Single with 1 son.  Her brother is also her support.  She is a former Regulatory affairs officer, Technical brewer, and also worked in the [employer].  Mayah is independent of all ADLs.  Does endorse increased fatigue around chemotherapy treatments.  Appetite fluctuates which is associated with nausea, pain, or her treatment cycles.  1.  Neoplasm related pain Ms. Shisler endorses ongoing sternal pain, bilateral shoulder/scapular area pain, bilateral hips, and lower back pain.  She was previously treated for chronic back pain and did not tolerate Suboxone.  We discussed her pain at length.  Unfortunately she has been in constant pain and fear of being profiled.  We discussed that she is having belly pain due to her cancer and her goal is to help improve her quality of life allow her to do as good as she can for as long as she can.  She understands we may not be able to completely resolve her pain but to gain better control allow her to engage in things that she enjoys.  She shares her pain causes her not to be able to sleep effectively at night.  Limits her ability to perform certain activities.  Makes it harder for her to get out of bed some days worse than others.  Her pain is constant and radiates across her shoulders and down to her lower back.  We discussed previous regimens.  She was previously taking hydrocodone as needed in addition to Robaxin muscle relaxer.  Unfortunately neither of these medications provide her with relief.  Education provided on long-acting and short acting pain medications.  She understands we will make adjustments to her regimen and continue with close monitoring.  Patient verbalized understanding and appreciation of ongoing support.  2.  Constipation Emelina endorses occasional constipation.  She also has some diarrhea around her chemotherapy treatments.  Education provided on bowel regimen in the setting of opioid use.  She understands around her treatment time if she  is experiencing diarrhea she may hold stool softeners until resolved.  3.  Goals of care We discussed her current illness and what it means in the larger context of her her on-going co-morbidities. Natural disease trajectory and expectations were discussed.  Ms. Riese is realistic in her understanding. She is emotional expressing her journey over the past several months. She has family support. Is choosing to remain as positive as she can while remaining hopeful for some stability/improvement with her current treatment. She speaks to her strong christian faith and daily affirmations which helps her get through each day and treatment. Emotional support provided. We discussed taking things one day at a time and focusing on what is most important to her and her quality of life.   I discussed the importance of continued conversation with family and their medical providers regarding overall plan of care and treatment options, ensuring decisions are within the context of the patients values and GOCs.  PLAN: Established  therapeutic relationship. Education provided on palliative's role in collaboration with their Oncology/Radiation team. Ongoing goals of care discussions and support. Xtampza 9 mg every 12 hours Oxy IR 5 mg every 6 hours as needed for breakthrough pain MiraLAX daily for bowel regimen I will plan to see patient back in 1-2 weeks in collaboration to other oncology appointments.  Patient knows to contact our office if needed sooner.   Patient expressed understanding and was in agreement with this plan. She also understands that She can call the clinic at any time with any questions, concerns, or complaints.   Thank you for your referral and allowing Palliative to assist in Ms. Charmayne Sheer [place]'s care.   Number and complexity of problems addressed: HIGH - 1 or more chronic illnesses with SEVERE exacerbation, progression, or side effects of treatment - advanced cancer, pain. Any controlled  substances utilized were prescribed in the context of palliative care.  Time Total: 65 min   Visit consisted of counseling and education dealing with the complex and emotionally intense issues of symptom management and palliative care in the setting of serious and potentially life-threatening illness.Greater than 50%  of this time was spent counseling and coordinating care related to the above assessment and plan.  Signed by: Alda Lea, AGPCNP-BC Palliative Medicine Team/[facility]

## 2021-11-12 NOTE — Progress Notes (Signed)
Cassidy [name], counseling intern, met with the patient for their scheduled counseling session.   The patient reported they had a good birthday - they spent time with their brother and their son. The patient reported they did not feel well after their chemo treatment but they are feeling better now. The patient reported that cancer was God's way of telling them to "open up". The patient reported they are practicing being more open and vulnerable with their brother and son.   The patient is currently stressed about housing due to lost paperwork on the housing department's end.   The patient reported they had a bad interaction with the doctor which they will bring up at their next appointment with the doctor.   The patient was curious if the counselor thought that they cried "too much." The counselor responded, "You have cancer. You're allowed to cry."   The patient will call next week to schedule their next counseling session.   [place],  [phone] [email or web address]

## 2021-11-16 ENCOUNTER — Encounter: Payer: Self-pay | Admitting: Internal Medicine

## 2021-11-16 ENCOUNTER — Telehealth: Payer: Self-pay | Admitting: Emergency Medicine

## 2021-11-16 ENCOUNTER — Other Ambulatory Visit: Payer: Self-pay | Admitting: Emergency Medicine

## 2021-11-16 NOTE — Progress Notes (Signed)
                                                                                                                                                             Patient Name: Cassidy [name] MRN: [id number] DOB: [birth year] Referring Physician: Curt Bears (Profile Not Attached) Date of Service: 10/22/2021 [facility] [place] [place]                                                        End Of Treatment Note  Diagnoses: C79.51-Secondary malignant neoplasm of bone  Cancer Staging: Stage IV, NSCLC, adenocarcinoma with K-ras mutation of the RUL involving the lungs, nodes of the chest, liver and bones   Intent: Palliative  Radiation Treatment Dates: 10/01/2021 through 10/22/2021 Site Technique Total Dose (Gy) Dose per Fx (Gy) Completed Fx Beam Energies  Lumbar Spine: Spine_L4/sacr 3D 37.5/37.5 2.5 15/15 15X   Narrative: The patient tolerated radiation therapy relatively well. She developed fatigue but her pain improved during radiation.  Plan: The patient will receive a call in about one month from the radiation oncology department. She will continue follow up with Dr. Julien Nordmann as well.  ________________________________________________    Carola Rhine, [facility]

## 2021-11-16 NOTE — Progress Notes (Signed)
[name]  PA wanted Rn to call to see if patient ever started  Decadron . Patient states that she never started the medication. So Rn removed medication from medication list per [name]

## 2021-11-18 ENCOUNTER — Other Ambulatory Visit: Payer: Self-pay

## 2021-11-18 ENCOUNTER — Inpatient Hospital Stay: Payer: Medicaid Other

## 2021-11-18 DIAGNOSIS — C3491 Malignant neoplasm of unspecified part of right bronchus or lung: Secondary | ICD-10-CM

## 2021-11-18 DIAGNOSIS — Z5112 Encounter for antineoplastic immunotherapy: Secondary | ICD-10-CM | POA: Diagnosis not present

## 2021-11-18 LAB — CBC WITH DIFFERENTIAL/PLATELET
Abs Immature Granulocytes: 0.01 10*3/uL (ref 0.00–0.07)
Basophils Absolute: 0 10*3/uL (ref 0.0–0.1)
Basophils Relative: 0 %
Eosinophils Absolute: 0 10*3/uL (ref 0.0–0.5)
Eosinophils Relative: 1 %
HCT: 26.3 % — ABNORMAL LOW (ref 36.0–46.0)
Hemoglobin: 9 g/dL — ABNORMAL LOW (ref 12.0–15.0)
Immature Granulocytes: 0 %
Lymphocytes Relative: 19 %
Lymphs Abs: 0.5 10*3/uL — ABNORMAL LOW (ref 0.7–4.0)
MCH: 30.6 pg (ref 26.0–34.0)
MCHC: 34.2 g/dL (ref 30.0–36.0)
MCV: 89.5 fL (ref 80.0–100.0)
Monocytes Absolute: 0.4 10*3/uL (ref 0.1–1.0)
Monocytes Relative: 15 %
Neutro Abs: 1.7 10*3/uL (ref 1.7–7.7)
Neutrophils Relative %: 65 %
Platelets: 126 10*3/uL — ABNORMAL LOW (ref 150–400)
RBC: 2.94 MIL/uL — ABNORMAL LOW (ref 3.87–5.11)
RDW: 19.9 % — ABNORMAL HIGH (ref 11.5–15.5)
WBC: 2.6 10*3/uL — ABNORMAL LOW (ref 4.0–10.5)
nRBC: 0 % (ref 0.0–0.2)

## 2021-11-18 LAB — CMP (CANCER CENTER ONLY)
ALT: 12 U/L (ref 0–44)
AST: 14 U/L — ABNORMAL LOW (ref 15–41)
Albumin: 3.8 g/dL (ref 3.5–5.0)
Alkaline Phosphatase: 79 U/L (ref 38–126)
Anion gap: 8 (ref 5–15)
BUN: 13 mg/dL (ref 6–20)
CO2: 25 mmol/L (ref 22–32)
Calcium: 8.7 mg/dL — ABNORMAL LOW (ref 8.9–10.3)
Chloride: 104 mmol/L (ref 98–111)
Creatinine: 0.87 mg/dL (ref 0.44–1.00)
GFR, Estimated: >60 mL/min (ref >60)
Glucose, Bld: 167 mg/dL — ABNORMAL HIGH (ref 70–99)
Potassium: 3.8 mmol/L (ref 3.5–5.1)
Sodium: 137 mmol/L (ref 135–145)
Total Bilirubin: 0.5 mg/dL (ref 0.3–1.2)
Total Protein: 6.9 g/dL (ref 6.5–8.1)

## 2021-11-22 NOTE — Progress Notes (Unsigned)
[facility] OFFICE PROGRESS NOTE  [name], DO [place] [address]  DIAGNOSIS: Stage IV (T2a, N2, M1c) non-small cell lung cancer, adenocarcinoma presented with right upper lobe lung mass in addition to right hilar and mediastinal lymphadenopathy and innumerable bilateral pulmonary nodules as well as liver and bone metastasis.  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    Detected Alteration(s) / Biomarker(s) Associated FDA-approved therapies  Clinical Trial Availability      % cfDNA or Amplification   KRAS G12C  approved by FDA Adagrasib, Sotorasib Yes   5.3%   IDH1 R132L  approved in other indication Ivosidenib, Olutasidenib Yes      1.9%   PD-L1 expression 89%  PRIOR THERAPY: palliative radiation to the painful metastatic bone lesions at L4 and the sacrum under the care of Dr. Lisbeth Renshaw. Last day on 10/22/21   CURRENT THERAPY: 1) Systemic chemotherapy with carboplatin for AUC of 5, Alimta 500 Mg/M2 and Keytruda 200 Mg IV every 3 weeks.  First dose September 02, 2021.  Status post 4 cycles.  Starting from cycle #5, the patient will start maintenance Alimta and Keytruda IV every 3 weeks.  INTERVAL HISTORY: CHRISTINEA BRIZUELA 56 y.o. female returns to the clinic today for a follow-up visit. The patient is feeling well today without any concerning complaints. The patient is currently undergoing palliative systemic chemotherapy.  Starting from today, she is scheduled to start her first cycle of maintenance treatment with Alimta and Keytruda only.  The patient has been tolerating treatment fairly well. Since being diagnosed, the patient has had pain secondary to the metastatic bone lesions.  She previously completed palliative radiation.  She is being followed closely by palliative care who is being treated with Xtampza extended release and oxycodone for breakthrough pain.  She is also on a bowel regimen. She is scheduled to see palliative care today.  She also recently saw her PCP for management of her diabetes.  She is also followed closely by calling from the counselor team which has been helpful for her. Her pain is a lot better controlled. She seems to be in good spirits today.    The patient denies any fever, chills, or night sweats.She gained weight and is going to call her PCP to see if she needs to restart her lasix and potassium. She is having a little more swelling. She monitors daily weights and was in parameters on when to call them for possible refill of her Lasix.    She reports her breathing is "good".  She has a stable intermittent cough which she states is the same.  She reports her mucus is colorless.  She denies any chest pain or hemoptysis.  She has some nausea on and off and takes her Zofran and Compazine.  Denies any vomiting.  She denies any headache or visual changes.  Her scan noticed some urinary bladder wall thickening.  Patient follows closely with [facility] regarding recurrent urinary tract infections.  She reports she has some dysuria.  She states she gets urinary tract infections frequently because she wears pull-ups to bed and has diabetes.  She would be interested in having this rechecked today.  She recently had a restaging CT scan performed.  She is here today for evaluation to review her scan results before starting cycle #5.   MEDICAL HISTORY: Past Medical History:  Diagnosis Date   Adenocarcinoma of right lung, stage 4 ([facility]) 08/26/2021   Anemia  has sickle cell trait   Atherosclerotic heart disease of native coronary artery with angina pectoris ([place]) 05/2012, 10/2013   a.  s/p PCTA to dRCA and ostial RPAV-PLA vessel + PDA branch (05/2012)  b. [place] s/p DES to mLAD - Resolute DES 3.0 x 22 (3.65m -->3.3 mm)   Bipolar depression ([facility]    Breast abscess    a. right side.    COPD (chronic obstructive pulmonary disease) (HCC)    Depression    Diabetic peripheral neuropathy (HCC)    Fibromyalgia    Genital  warts    GERD (gastroesophageal reflux disease)    History of hiatal hernia    HLD (hyperlipidemia)    Hypertension    Pain in limb    a. LE VENOUS DUPLEX, 02/05/2009 - no evidence of deep vein thrombosis, Baker's cyst   Pneumonia    PTSD (post-traumatic stress disorder)    Sickle cell trait (HCC)    Tobacco abuse    Tooth caries    Type II diabetes mellitus (HCC)     ALLERGIES:  is allergic to amoxil [amoxicillin], chantix [varenicline], suboxone [buprenorphine hcl-naloxone hcl], and emend [fosaprepitant dimeglumine].  MEDICATIONS:  Current Outpatient Medications  Medication Sig Dispense Refill   aspirin EC 81 MG tablet Take 81 mg by mouth daily.     atorvastatin (LIPITOR) 40 MG tablet TAKE 1 TABLET (40 MG TOTAL) BY MOUTH DAILY. 90 tablet 3   busPIRone (BUSPAR) 15 MG tablet TAKE 1/2 TABLET (7.5 MG TOTAL) BY MOUTH TWICE DAILY (Patient taking differently: Take 7.5 mg by mouth 2 (two) times daily.) 30 tablet 11   Carboxymethylcellul-Glycerin (LUBRICATING EYE DROPS OP) Place 1 drop into both eyes daily as needed (dry eyes).     carvedilol (COREG) 12.5 MG tablet TAKE 1 TABLET (12.5 MG TOTAL) BY MOUTH 2 (TWO) TIMES DAILY WITH A MEAL. 180 tablet 1   diclofenac Sodium (VOLTAREN) 1 % GEL Apply 1 application topically 3 times daily as needed for pain 2 g 3   ferrous sulfate 325 (65 FE) MG tablet Take 325 mg by mouth daily with breakfast.     fluticasone (FLONASE) 50 MCG/ACT nasal spray PLACE 1 SPRAY INTO BOTH NOSTRILS DAILY AS NEEDED (CONGESTION). 16 g 1   fluticasone-salmeterol (ADVAIR) 100-50 MCG/ACT AEPB Inhale 1 puff into the lungs 2 (two) times daily. 1 each 3   folic acid (FOLVITE) 1 MG tablet Take 1 tablet (1 mg total) by mouth daily. 30 tablet 3   furosemide (LASIX) 40 MG tablet Take 1 tablet (40 mg total) by mouth daily as needed. If you gain 3 lbs in 24 hours or 5 lbs in 1 week 30 tablet 1   glucose blood (ACCU-CHEK AVIVA PLUS) test strip Use as instructed 50 each 1   insulin detemir  (LEVEMIR FLEXPEN) 100 UNIT/ML FlexPen Inject 15 Units into the skin 2 (two) times daily. 15 mL 1   Insulin Pen Needle 32G X 4 MM MISC 1 Needle by Does not apply route in the morning and at bedtime. 120 each 2   ipratropium (ATROVENT HFA) 17 MCG/ACT inhaler Inhale 2 puffs into the lungs every 4 (four) hours as needed for wheezing (COPD).     lidocaine-prilocaine (EMLA) cream Apply 1 Application topically as needed. 30 g 2   LINZESS 290 MCG CAPS capsule TAKE 1 CAPSULE (290 MCG TOTAL) BY MOUTH DAILY BEFORE BREAKFAST. 30 capsule 3   losartan (COZAAR) 50 MG tablet Take 50 mg by mouth daily.     nicotine  polacrilex (NICORETTE) 2 MG gum Take 1 each (2 mg total) by mouth as needed for smoking cessation. 100 tablet 0   nitroGLYCERIN (NITROSTAT) 0.4 MG SL tablet Place 1 tablet (0.4 mg total) under the tongue every 5 (five) minutes as needed for chest pain. 25 tablet 5   nystatin (MYCOSTATIN) 100000 UNIT/ML suspension Take 5 mLs (500,000 Units total) by mouth 4 (four) times daily. 60 mL 0   ondansetron (ZOFRAN) 8 MG tablet Take 1 tablet (8 mg total) by mouth every 8 (eight) hours as needed for nausea or vomiting. 30 tablet 1   oxyCODONE (OXY IR/ROXICODONE) 5 MG immediate release tablet Take 1 tablet (5 mg total) by mouth every 6 (six) hours as needed for severe pain. 60 tablet 0   oxyCODONE ER (XTAMPZA ER) 9 MG C12A Take 9 mg by mouth every 12 (twelve) hours. 60 capsule 0   pantoprazole (PROTONIX) 40 MG tablet TAKE 1 TABLET (40 MG TOTAL) BY MOUTH DAILY. 30 tablet 1   polyethylene glycol powder (GLYCOLAX/MIRALAX) 17 GM/SCOOP powder Take 17 g by mouth daily as needed for mild constipation or moderate constipation. 3350 g 1   prochlorperazine (COMPAZINE) 10 MG tablet Take 1 tablet (10 mg total) by mouth every 6 (six) hours as needed for nausea or vomiting. 30 tablet 0   sertraline (ZOLOFT) 50 MG tablet TAKE 1 TABLET (50 MG TOTAL) BY MOUTH DAILY. 30 tablet 3   valACYclovir (VALTREX) 1000 MG tablet TAKE FOR 3 DAYS  AS NEEDED FOR EACH OUTBREAK 30 tablet 11   vitamin B-12 (CYANOCOBALAMIN) 1000 MCG tablet Take 1,000 mcg by mouth daily.     potassium chloride SA (KLOR-CON M) 20 MEQ tablet Take 1 tablet (20 mEq total) by mouth daily as needed. On days you take Lasix (Patient not taking: Reported on 11/25/2021) 30 tablet 1   No current facility-administered medications for this visit.    SURGICAL HISTORY:  Past Surgical History:  Procedure Laterality Date   BLADDER SURGERY  1980   "TVT"   BLADDER SURGERY  2003   "TVT"   BREAST EXCISIONAL BIOPSY     BREAST SURGERY Right    I&D for multiple abscesses   CARDIAC CATHETERIZATION     CORONARY ARTERY BYPASS GRAFT N/A 06/30/2021   Procedure: CORONARY ARTERY BYPASS GRAFTING (CABG) x3 USING LEFT INTERNAL MAMMARY ARTERY,  RIGHT RADIAL ARTERY AND LEFT GREATER SAPHENOUS VEIN;  Surgeon: Lajuana Matte, MD;  Location: [facility];  Service: Open Heart Surgery;  Laterality: N/A;   cutting balloon     ENDOVEIN HARVEST OF GREATER SAPHENOUS VEIN Left 06/30/2021   Procedure: ENDOVEIN HARVEST OF GREATER SAPHENOUS VEIN;  Surgeon: Lajuana Matte, MD;  Location: [facility];  Service: Open Heart Surgery;  Laterality: Left;   EYE SURGERY Left    laser surgery   FINE NEEDLE ASPIRATION  08/21/2021   Procedure: FINE NEEDLE ASPIRATION (FNA) LINEAR;  Surgeon: Garner Nash, DO;  Location: [facility];  Service: Pulmonary;;   INCISE AND DRAIN ABCESS  ; 04/26/11; 08/13/11   right breast   INCISION AND DRAINAGE ABSCESS Right 05/09/2012   Procedure: INCISION AND DRAINAGE RIGHT BREAST ABSCESS;  Surgeon: Imogene Burn. Georgette Dover, MD;  Location: [facility];  Service: General;  Laterality: Right;   INCISION AND DRAINAGE ABSCESS Right 02/08/2014   Procedure: INCISION AND DRAINAGE RIGHT BREAST ABSCESS;  Surgeon: Donnie Mesa, MD;  Location: [facility];  Service: General;  Laterality: Right;   INCISION AND DRAINAGE ABSCESS Right 06/14/2014   Procedure: INCISION  AND DRAINAGE RIGHT BREAST ABSCESS;  Surgeon:  Donnie Mesa, MD;  Location: [facility];  Service: General;  Laterality: Right;   IRRIGATION AND DEBRIDEMENT ABSCESS  12/19/2010   Procedure: IRRIGATION AND DEBRIDEMENT ABSCESS;  Surgeon: Rolm Bookbinder, MD;  Location: [facility];  Service: General;  Laterality: Right;   IRRIGATION AND DEBRIDEMENT ABSCESS  08/13/2011   Procedure: MINOR INCISION AND DRAINAGE OF ABSCESS;  Surgeon: Gwenyth Ober, MD;  Location: [facility];  Service: General;  Laterality: Right;  Right Breast    IRRIGATION AND DEBRIDEMENT ABSCESS  11/17/2011   Procedure: IRRIGATION AND DEBRIDEMENT ABSCESS;  Surgeon: Imogene Burn. Georgette Dover, MD;  Location: [facility];  Service: General;  Laterality: Right;  irrigation and debridement right recurrent breast abscess   LARYNX SURGERY     LEFT HEART CATH AND CORONARY ANGIOGRAPHY N/A 04/09/2021   Procedure: LEFT HEART CATH AND CORONARY ANGIOGRAPHY;  Surgeon: Jettie Booze, MD;  Location: [facility];  Service: Cardiovascular;  Laterality: N/A;   LEFT HEART CATHETERIZATION WITH CORONARY ANGIOGRAM N/A 05/29/2012   Procedure: LEFT HEART CATHETERIZATION WITH CORONARY ANGIOGRAM & PTCA;  Surgeon: Troy Sine, MD;  Location: [facility];  Service: Cardiovascular; Bifurcation dRCA-RPAD/PLA & rPDA  PTCA.   LEFT HEART CATHETERIZATION WITH CORONARY ANGIOGRAM N/A 11/08/2013   Procedure: LEFT HEART CATHETERIZATION WITH CORONARY ANGIOGRAM and Coronary Stent Intervention;  Surgeon: Leonie Man, MD;  Location: [facility];  Service: Cardiovascular; mLAD 80% (FFR 0.73) - resolute DES 3.0 x 22 mm (postdilated to 3.3 mm->3.5 mm)   NM MYOVIEW LTD  01/26/2009   Normal study, no evidence of ischemia, EF 67%   ployp removed from voice box 03/30/12     RADIAL ARTERY HARVEST Right 06/30/2021   Procedure: RADIAL ARTERY HARVEST;  Surgeon: Lajuana Matte, MD;  Location: [facility];  Service: Open Heart Surgery;  Laterality: Right;   REFRACTIVE SURGERY  ~ 2010   right   TEE WITHOUT CARDIOVERSION N/A 06/30/2021    Procedure: TRANSESOPHAGEAL ECHOCARDIOGRAM (TEE);  Surgeon: Lajuana Matte, MD;  Location: [facility];  Service: Open Heart Surgery;  Laterality: N/A;   TONSILLECTOMY  1988   VIDEO BRONCHOSCOPY WITH ENDOBRONCHIAL ULTRASOUND Bilateral 08/21/2021   Procedure: VIDEO BRONCHOSCOPY WITH ENDOBRONCHIAL ULTRASOUND;  Surgeon: Garner Nash, DO;  Location: [facility];  Service: Pulmonary;  Laterality: Bilateral;    REVIEW OF SYSTEMS:   Review of Systems  Constitutional: Positive for stable fatigue. Negative for appetite change, chills, fever and unexpected weight change.  HENT: Negative for mouth sores, nosebleeds, sore throat and trouble swallowing.   Eyes: Negative for eye problems and icterus.  Respiratory: Positive for stable mild cough. Negative for hemoptysis, shortness of breath and wheezing.   Cardiovascular: Negative for chest pain and leg swelling.  Gastrointestinal: Positive for mild nausea. Negative for abdominal pain, constipation, diarrhea, and vomiting.  Genitourinary: Positive for urinary urgency and some dysuria. Negative for bladder incontinence, frequency and hematuria.   Musculoskeletal: Positive for improved pain control. Negative for gait problem, neck pain and neck stiffness.  Skin: Negative for itching and rash.  Neurological: Negative for dizziness, extremity weakness, gait problem, headaches, light-headedness and seizures.  Hematological: Negative for adenopathy. Does not bruise/bleed easily.  Psychiatric/Behavioral: Negative for confusion, depression and sleep disturbance. The patient is not nervous/anxious.     PHYSICAL EXAMINATION:  Blood pressure (!) 111/56, pulse 77, temperature 97.8 F (36.6 C), temperature source Temporal, resp. rate 19, height 5' 7.5" (1.715 m), weight 214 lb 4.8 oz (97.2  kg), last menstrual period 06/12/2016, SpO2 100 %.  ECOG PERFORMANCE STATUS: 1  Physical Exam  Constitutional: Oriented to person, place, and time and well-developed,  well-nourished, and in no distress. HENT:  Head: Normocephalic and atraumatic.  Mouth/Throat: Oropharynx is clear and moist. No oropharyngeal exudate.  Eyes: Conjunctivae are normal. Right eye exhibits no discharge. Left eye exhibits no discharge. No scleral icterus.  Neck: Normal range of motion. Neck supple.  Cardiovascular: Normal rate, regular rhythm, normal heart sounds and intact distal pulses.   Pulmonary/Chest: Effort normal and breath sounds normal. No respiratory distress. No wheezes. No rales.  Abdominal: Soft. Bowel sounds are normal. Exhibits no distension and no mass. There is no tenderness.  Musculoskeletal: Normal range of motion. Exhibits no edema.  Lymphadenopathy:    No cervical adenopathy.  Neurological: Alert and oriented to person, place, and time. Exhibits normal muscle tone. Gait normal. Coordination normal.  Skin: Skin is warm and dry. No rash noted. Not diaphoretic. No erythema. No pallor.  Psychiatric: Mood, memory and judgment normal.  Vitals reviewed.  LABORATORY DATA: Lab Results  Component Value Date   WBC 3.7 (L) 11/25/2021   HGB 9.7 (L) 11/25/2021   HCT 28.3 (L) 11/25/2021   MCV 91.3 11/25/2021   PLT 207 11/25/2021      Chemistry      Component Value Date/Time   NA 137 11/25/2021 1049   NA 140 04/03/2021 1059   K 4.0 11/25/2021 1049   CL 104 11/25/2021 1049   CO2 28 11/25/2021 1049   BUN 11 11/25/2021 1049   BUN 13 04/03/2021 1059   CREATININE 0.81 11/25/2021 1049   CREATININE 0.65 11/06/2013 1520      Component Value Date/Time   CALCIUM 9.0 11/25/2021 1049   ALKPHOS 78 11/25/2021 1049   AST 11 (L) 11/25/2021 1049   ALT 9 11/25/2021 1049   BILITOT 0.7 11/25/2021 1049       RADIOGRAPHIC STUDIES:  CT Chest W Contrast  Result Date: 11/24/2021 CLINICAL DATA:  Non-small cell lung cancer restaging * Tracking Code: BO * EXAM: CT CHEST, ABDOMEN, AND PELVIS WITH CONTRAST TECHNIQUE: Multidetector CT imaging of the chest, abdomen and pelvis  was performed following the standard protocol during bolus administration of intravenous contrast. RADIATION DOSE REDUCTION: This exam was performed according to the departmental dose-optimization program which includes automated exposure control, adjustment of the mA and/or kV according to patient size and/or use of iterative reconstruction technique. CONTRAST:  144m OMNIPAQUE IOHEXOL 300 MG/ML  SOLN COMPARISON:  CT chest angiogram, 09/29/2021, PET-CT, 08/28/2021 CT abdomen pelvis, 05/22/2015 FINDINGS: CT CHEST FINDINGS Cardiovascular: Aortic atherosclerosis. Normal heart size. Three-vessel coronary artery calcifications status post median sternotomy and CABG. No pericardial effusion. Mediastinum/Nodes: Unchanged enlarged right hilar lymph node measuring 1.9 x 1.7 cm (series 2, image 27). Other right hilar lymph nodes are diminished in size (series 2, image 71). Diminished size of subcarinal lymph node measuring 2.0 x 1.0 cm, previously 2.8 x 1.6 cm (series 2, image 29). Otherwise matted appearance of treated soft tissue. Thyroid gland, trachea, and esophagus demonstrate no significant findings. Lungs/Pleura: Diminished size of a thick-walled cavitary nodule of the peripheral right upper lobe, measuring 2.0 x 1.3 cm, previously 2.8 x 2.0 cm (series 6, image 44). Multiple additional smaller bilateral pulmonary nodules are essentially completely resolved, for example with only a faint irregular residua of a nodule seen in the posterior left upper lobe (series 6, image 70). Background of very fine centrilobular nodules, most concentrated in the lung  apices. No pleural effusion or pneumothorax. Musculoskeletal: Status post median sternotomy and CABG, with partial healing and callus formation. Subacute, partially callused fracture of the lateral left sixth rib, with new sclerosis, previously FDG avid (series 2, image 37). CT ABDOMEN PELVIS FINDINGS Hepatobiliary: Diminished size of a hypodense, previously FDG avid  lesion of the tip of the left lobe of the liver, hepatic segment II/III measuring 0.9 x 0.8 cm, previously 1.9 x 1.6 cm (series 2, image 55) as well as a hypodense, previously FDG avid lesion of the inferior right lobe of the liver, hepatic segment VI, measuring 0.6 cm, previously 1.5 x 1.1 cm (series 2, image 78). Additional hypodense lesions of the posterior liver dome, hepatic segment VIII, are not significantly changed, these not previously FDG avid and stable in comparison to remote prior examination dated 05/22/2015, slightly photopenic and most likely benign cysts or hemangiomata (series 2, image 5153). No gallstones, gallbladder wall thickening, or biliary dilatation. Pancreas: Unremarkable. No pancreatic ductal dilatation or surrounding inflammatory changes. Spleen: Normal in size without significant abnormality. Adrenals/Urinary Tract: Stable, definitively benign right adrenal adenoma, not previously FDG avid and unchanged in comparison to remote prior examination dated 05/22/2015, no further follow-up or characterization required. Kidneys are normal, without renal calculi, solid lesion, or hydronephrosis. Diffuse urinary bladder wall thickening. Stomach/Bowel: Stomach is within normal limits. Appendix appears normal. No evidence of bowel wall thickening, distention, or inflammatory changes. Large burden of stool throughout the colon. Vascular/Lymphatic: Aortic atherosclerosis. No enlarged abdominal or pelvic lymph nodes. Reproductive: No mass or other abnormality. Other: No abdominal wall hernia or abnormality. No ascites. Musculoskeletal: No acute osseous findings. New sclerosis of multiple scattered previously FDG avid osseous metastatic lesions, including of the L4 vertebral body (series 2, image 81), the S1 segment (series 2, image 89), in the proximal right femur (series 2, image 117). IMPRESSION: 1. When compared to most recent imaging of the chest dated 09/29/2021, diminished size of a thick-walled  cavitary nodule of the peripheral right upper lobe. 2. Multiple additional smaller bilateral pulmonary nodules are essentially completely resolved. 3. Stable to slightly diminished mediastinal and right hilar lymphadenopathy. 4. When compared to most recent imaging of the abdomen dated 08/28/2021, diminished size of metastatic lesions of the liver. 5. New post treatment sclerosis of multiple scattered previously lytic and FDG avid osseous metastatic lesions. 6. Findings are consistent with treatment response of primary lung malignancy, pulmonary, nodal, hepatic, and osseous metastatic disease. 7. Background of very fine centrilobular nodules, most concentrated in the lung apices, consistent with smoking-related respiratory bronchiolitis. 8. Diffuse urinary bladder wall thickening, suggestive of nonspecific infectious or inflammatory cystitis. Correlate with urinalysis. 9. Coronary artery disease. Aortic Atherosclerosis (ICD10-I70.0). Electronically Signed   By: Delanna Ahmadi M.D.   On: 11/24/2021 16:44   CT Abdomen Pelvis W Contrast  Result Date: 11/24/2021 CLINICAL DATA:  Non-small cell lung cancer restaging * Tracking Code: BO * EXAM: CT CHEST, ABDOMEN, AND PELVIS WITH CONTRAST TECHNIQUE: Multidetector CT imaging of the chest, abdomen and pelvis was performed following the standard protocol during bolus administration of intravenous contrast. RADIATION DOSE REDUCTION: This exam was performed according to the departmental dose-optimization program which includes automated exposure control, adjustment of the mA and/or kV according to patient size and/or use of iterative reconstruction technique. CONTRAST:  181m OMNIPAQUE IOHEXOL 300 MG/ML  SOLN COMPARISON:  CT chest angiogram, 09/29/2021, PET-CT, 08/28/2021 CT abdomen pelvis, 05/22/2015 FINDINGS: CT CHEST FINDINGS Cardiovascular: Aortic atherosclerosis. Normal heart size. Three-vessel coronary artery calcifications status post median  sternotomy and CABG. No  pericardial effusion. Mediastinum/Nodes: Unchanged enlarged right hilar lymph node measuring 1.9 x 1.7 cm (series 2, image 27). Other right hilar lymph nodes are diminished in size (series 2, image 71). Diminished size of subcarinal lymph node measuring 2.0 x 1.0 cm, previously 2.8 x 1.6 cm (series 2, image 29). Otherwise matted appearance of treated soft tissue. Thyroid gland, trachea, and esophagus demonstrate no significant findings. Lungs/Pleura: Diminished size of a thick-walled cavitary nodule of the peripheral right upper lobe, measuring 2.0 x 1.3 cm, previously 2.8 x 2.0 cm (series 6, image 44). Multiple additional smaller bilateral pulmonary nodules are essentially completely resolved, for example with only a faint irregular residua of a nodule seen in the posterior left upper lobe (series 6, image 70). Background of very fine centrilobular nodules, most concentrated in the lung apices. No pleural effusion or pneumothorax. Musculoskeletal: Status post median sternotomy and CABG, with partial healing and callus formation. Subacute, partially callused fracture of the lateral left sixth rib, with new sclerosis, previously FDG avid (series 2, image 37). CT ABDOMEN PELVIS FINDINGS Hepatobiliary: Diminished size of a hypodense, previously FDG avid lesion of the tip of the left lobe of the liver, hepatic segment II/III measuring 0.9 x 0.8 cm, previously 1.9 x 1.6 cm (series 2, image 55) as well as a hypodense, previously FDG avid lesion of the inferior right lobe of the liver, hepatic segment VI, measuring 0.6 cm, previously 1.5 x 1.1 cm (series 2, image 78). Additional hypodense lesions of the posterior liver dome, hepatic segment VIII, are not significantly changed, these not previously FDG avid and stable in comparison to remote prior examination dated 05/22/2015, slightly photopenic and most likely benign cysts or hemangiomata (series 2, image 5153). No gallstones, gallbladder wall thickening, or biliary  dilatation. Pancreas: Unremarkable. No pancreatic ductal dilatation or surrounding inflammatory changes. Spleen: Normal in size without significant abnormality. Adrenals/Urinary Tract: Stable, definitively benign right adrenal adenoma, not previously FDG avid and unchanged in comparison to remote prior examination dated 05/22/2015, no further follow-up or characterization required. Kidneys are normal, without renal calculi, solid lesion, or hydronephrosis. Diffuse urinary bladder wall thickening. Stomach/Bowel: Stomach is within normal limits. Appendix appears normal. No evidence of bowel wall thickening, distention, or inflammatory changes. Large burden of stool throughout the colon. Vascular/Lymphatic: Aortic atherosclerosis. No enlarged abdominal or pelvic lymph nodes. Reproductive: No mass or other abnormality. Other: No abdominal wall hernia or abnormality. No ascites. Musculoskeletal: No acute osseous findings. New sclerosis of multiple scattered previously FDG avid osseous metastatic lesions, including of the L4 vertebral body (series 2, image 81), the S1 segment (series 2, image 89), in the proximal right femur (series 2, image 117). IMPRESSION: 1. When compared to most recent imaging of the chest dated 09/29/2021, diminished size of a thick-walled cavitary nodule of the peripheral right upper lobe. 2. Multiple additional smaller bilateral pulmonary nodules are essentially completely resolved. 3. Stable to slightly diminished mediastinal and right hilar lymphadenopathy. 4. When compared to most recent imaging of the abdomen dated 08/28/2021, diminished size of metastatic lesions of the liver. 5. New post treatment sclerosis of multiple scattered previously lytic and FDG avid osseous metastatic lesions. 6. Findings are consistent with treatment response of primary lung malignancy, pulmonary, nodal, hepatic, and osseous metastatic disease. 7. Background of very fine centrilobular nodules, most concentrated in  the lung apices, consistent with smoking-related respiratory bronchiolitis. 8. Diffuse urinary bladder wall thickening, suggestive of nonspecific infectious or inflammatory cystitis. Correlate with urinalysis. 9. Coronary artery disease.  Aortic Atherosclerosis (ICD10-I70.0). Electronically Signed   By: Delanna Ahmadi M.D.   On: 11/24/2021 16:44     ASSESSMENT/PLAN:  This is a very pleasant 56 year old African-American female diagnosed with stage IV (T2 a, N2, M1 C) non-small cell lung cancer, adenocarcinoma.  The patient presented with a right upper lobe lung mass in addition to right hilar and mediastinal lymphadenopathy.  She also has innumerable bilateral pulmonary nodules, metastatic bone, liver, and adrenal lesions.  She was diagnosed in August 2023.  She is positive for K-ras G12 C mutation and her PD-L1 expression is 89%   The patient completed  palliative radiotherapy to the metastatic bone lesions at L4 and the sacrum under the care of Dr. Lisbeth Renshaw.  The last day radiation is tentatively scheduled for 10/22/21.    The patient is currently undergoing palliative systemic chemotherapy with carboplatin for an AUC of 5, Alimta 500 mg per metered square, Keytruda 200 mg IV every 3 weeks.  She is status post 4 cycles.  Starting from today, cycle #5 the patient will start maintenance Alimta and Keytruda.  The patient recently had a restaging CT scan performed.  Dr. Julien Nordmann personally and independently reviewed the scan discussed results with the patient today.  The scan showed positive response to treatment.  Recommend that she proceed with cycle #5 today as scheduled.  We will see her back for follow-up visit in 3 weeks for evaluation and repeat blood work before undergoing the next cycle of treatment with cycle #6.  The patient is interested in a Port-A-Cath.  I have placed an order.  I reviewed how to use the Emla cream.  I will asked scheduling to make a lab appointment to lab and flush appointment  starting from cycle #7.  If the patient is able to get her Port-A-Cath placed prior to her next appointment, I asked her to call us and I will be happy to change her next lab appointment as well.  She will continue to follow with palliative care.  He is scheduled to see them today.  The patient's pain is well controlled with her current regimen.  I have ordered a UA and culture regarding her dysuria.  The patient denies any allergies to any antibiotics.  Patient had a brain MRI performed which showed a possible punctate left frontal lobe white matter metastasis that is subtle but poorly resolved vessel not excluded.  We will monitor this closely on repeat brain MRI in 3 months.  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    The patient was advised to call immediately if she has any concerning symptoms in the interval. The patient voices understanding of current disease status and treatment options and is in agreement with the current care plan. All questions were answered. The patient knows to call the clinic with any problems, questions or concerns. We can certainly see the patient much sooner if necessary  Orders Placed This Encounter  Procedures   Urine Culture    Standing Status:   Future    Standing Expiration Date:   11/25/2022   IR IMAGING GUIDED PORT INSERTION    Standing Status:   Future    Standing Expiration Date:   11/26/2022    Order Specific Question:   Reason for Exam (SYMPTOM  OR DIAGNOSIS REQUIRED)    Answer:   Poor IV access    Order Specific Question:   Preferred Imaging Location?    Answer:   [facility]  Order Specific Question:   Is the patient pregnant?    Answer:   No   Urinalysis, Complete w Microscopic    Standing Status:   Future    Standing Expiration Date:   11/26/2022      Tobe Sos Stacey Maura, PA-C 11/25/21  ADDENDUM: Hematology/Oncology Attending: I had a face-to-face encounter with the patient today.  I reviewed her  record, lab, scan and recommended her care plan.  This is a very pleasant 56 years old African-American female with stage IV non-small cell lung cancer, adenocarcinoma with positive KRAS G12C mutation and PD-L1 expression of 89%. The patient is status post palliative radiotherapy to painful metastatic bone lesions. She is currently undergoing systemic chemotherapy with carboplatin, Alimta and Keytruda status post 4 cycles.  She has been tolerating this treatment fairly well. She had repeat CT scan of the chest, abdomen and pelvis performed recently.  I personally and independently reviewed the scan and discussed the result with the patient today. Her scan showed improvement of her disease. I recommended for the patient to continue her current treatment but she will start with maintenance therapy with Alimta and Keytruda every 3 weeks starting from cycle #5. The patient will come back for follow-up visit in 3 weeks for evaluation before the next cycle of her treatment. She was advised to call immediately if she has any other concerning symptoms in the interval.  The total time spent in the appointment was 30 minutes. Disclaimer: This note was dictated with voice recognition software. Similar sounding words can inadvertently be transcribed and may be missed upon review. Eilleen Kempf, MD

## 2021-11-23 ENCOUNTER — Ambulatory Visit (HOSPITAL_COMMUNITY)
Admission: RE | Admit: 2021-11-23 | Discharge: 2021-11-23 | Disposition: A | Discharge status: Home / Self Care | Payer: Medicaid Other | Source: Ambulatory Visit | Attending: Internal Medicine | Admitting: Internal Medicine

## 2021-11-23 ENCOUNTER — Encounter: Payer: Self-pay | Admitting: Family Medicine

## 2021-11-23 ENCOUNTER — Ambulatory Visit (INDEPENDENT_AMBULATORY_CARE_PROVIDER_SITE_OTHER): Payer: Medicaid Other | Admitting: Family Medicine

## 2021-11-23 VITALS — BP 128/68 | Ht 67.5 in | Wt 212.0 lb

## 2021-11-23 DIAGNOSIS — I10 Essential (primary) hypertension: Secondary | ICD-10-CM

## 2021-11-23 DIAGNOSIS — C3490 Malignant neoplasm of unspecified part of unspecified bronchus or lung: Secondary | ICD-10-CM | POA: Diagnosis not present

## 2021-11-23 DIAGNOSIS — R918 Other nonspecific abnormal finding of lung field: Secondary | ICD-10-CM | POA: Diagnosis not present

## 2021-11-23 DIAGNOSIS — N3289 Other specified disorders of bladder: Secondary | ICD-10-CM | POA: Diagnosis not present

## 2021-11-23 DIAGNOSIS — C3491 Malignant neoplasm of unspecified part of right bronchus or lung: Secondary | ICD-10-CM | POA: Diagnosis present

## 2021-11-23 DIAGNOSIS — Z794 Long term (current) use of insulin: Secondary | ICD-10-CM

## 2021-11-23 DIAGNOSIS — E119 Type 2 diabetes mellitus without complications: Secondary | ICD-10-CM | POA: Diagnosis not present

## 2021-11-23 MED ORDER — FLUTICASONE-SALMETEROL 100-50 MCG/ACT IN AEPB: 1.0000 | INHALATION_SPRAY | Freq: Two times a day (BID) | RESPIRATORY_TRACT | 3 refills | Status: DC

## 2021-11-23 MED ORDER — IOHEXOL 300 MG/ML  SOLN
100.0000 mL | Freq: Once | INTRAMUSCULAR | Status: AC | PRN
  Administered 2021-11-23: 100 mL via INTRAVENOUS

## 2021-11-23 MED ORDER — INSULIN PEN NEEDLE 32G X 4 MM MISC: 1.0000 | Freq: Two times a day (BID) | 2 refills | Status: DC

## 2021-11-23 MED ORDER — SODIUM CHLORIDE (PF) 0.9 % IJ SOLN
INTRAMUSCULAR | Status: AC
  Filled 2021-11-23: qty 50

## 2021-11-23 MED ORDER — LEVEMIR FLEXPEN 100 UNIT/ML ~~LOC~~ SOPN: 15.0000 [IU] | PEN_INJECTOR | Freq: Two times a day (BID) | SUBCUTANEOUS | 1 refills | Status: DC

## 2021-11-23 MED ORDER — POLYETHYLENE GLYCOL 3350 17 GM/SCOOP PO POWD: 17.0000 g | Freq: Every day | ORAL | 1 refills | Status: DC | PRN

## 2021-11-23 NOTE — Assessment & Plan Note (Signed)
No adjustments today to her medications. Discussed if BP is persistently low to return for adjustments. Reviewed her home logs and majority are within acceptable range.

## 2021-11-23 NOTE — Assessment & Plan Note (Addendum)
She brought her sugar log with her- most sugars tend to be in the 140-160 range but she has some in the 200s. No low sugars. -Increase Levemir to 15U BID, new Rx sent -Continue to check sugars and record  -Continue ARB, statin -Will plan for A1c in December given recent elevation with chemotherapy and suspect that A1c will be slightly higher than previous

## 2021-11-23 NOTE — Progress Notes (Signed)
    SUBJECTIVE:   CHIEF COMPLAINT / HPI:   Cassidy [name] is a 56 y.o. female who presents to the [facility] clinic today to discuss the following concerns:   T2DM Went up on her Levemir to 20U- taking once daily. She needs refill on her needles. She has not experienced any low sugars. She checks her sugars daily. She reports she needs another prescription sent that states to take her insulin twice a day so that she can have enough filled at the pharmacy.   HTN Was advised to stop her Amlodipine last visit. She checks her blood pressure daily, they are mostly in the normal range.  She has had some soft blood pressures with systolic less than 149 but greater than 100.  She states that during this time she has low energy.  Lung Cancer She follows with Oncology and Palliative. Her pain seems to be under control with her [place]. She has an appointment for a CT today to check on the progress of therapy.   PERTINENT  PMH / PSH: Stage 4 adenocarcinoma of the lung, hypertension, CAD s/p CABG x3, GERD, T2DM, PTSD, bipolar 1    OBJECTIVE:   BP 128/68   Ht 5' 7.5" (1.715 m)   Wt 212 lb (96.2 kg)   LMP 06/12/2016 (Approximate)   BMI 32.71 kg/m    General: NAD, pleasant, able to participate in exam Cardiac: RRR, no murmurs. Respiratory: Normal effort on room air Extremities: no edema or cyanosis. Psych: Normal affect and mood  ASSESSMENT/PLAN:   Adenocarcinoma of right lung, stage 4 ([place]) Has CT appointment later this afternoon. Still undergoing chemotherapy. F/u with oncology/palliative as scheduled.   Type 2 diabetes mellitus without complications (Cassidy [name]) She brought her sugar log with her- most sugars tend to be in the 140-160 range but she has some in the 200s. No low sugars. -Increase Levemir to 15U BID, new Rx sent -Continue to check sugars and record  -Continue ARB, statin -Will plan for A1c in December given recent elevation with chemotherapy and suspect that A1c will be slightly  higher than previous  Essential hypertension No adjustments today to her medications. Discussed if BP is persistently low to return for adjustments. Reviewed her home logs and majority are within acceptable range.     Cassidy [name], [place]

## 2021-11-23 NOTE — Assessment & Plan Note (Signed)
Has CT appointment later this afternoon. Still undergoing chemotherapy. F/u with oncology/palliative as scheduled.

## 2021-11-23 NOTE — Patient Instructions (Addendum)
It was wonderful to see you today.  Please bring ALL of your medications with you to every visit.   Today we talked about:  -Take Levemir 15 U twice a day. Check your sugars twice a day. Continue to record them.  -If you have persistently low blood pressures, please return.  -I have refilled your insulin needles and your Advair.  Thank you for coming to your visit as scheduled. We have had a large "no-show" problem lately, and this significantly limits our ability to see and care for patients. As a friendly reminder- if you cannot make your appointment please call to cancel. We do have a no show policy for those who do not cancel within 24 hours. Our policy is that if you miss or fail to cancel an appointment within 24 hours, 3 times in a 78-month period, you may be dismissed from our clinic.   Thank you for choosing [facility].   Please call [phone] with any questions about today's appointment.  Please be sure to schedule follow up at the front  desk before you leave today.   [name], DO PGY-3 Family Medicine

## 2021-11-25 ENCOUNTER — Inpatient Hospital Stay (HOSPITAL_BASED_OUTPATIENT_CLINIC_OR_DEPARTMENT_OTHER): Payer: Medicaid Other | Admitting: Physician Assistant

## 2021-11-25 ENCOUNTER — Inpatient Hospital Stay (HOSPITAL_BASED_OUTPATIENT_CLINIC_OR_DEPARTMENT_OTHER): Payer: Medicaid Other | Admitting: Nurse Practitioner

## 2021-11-25 ENCOUNTER — Encounter: Payer: Self-pay | Admitting: Nurse Practitioner

## 2021-11-25 ENCOUNTER — Inpatient Hospital Stay: Payer: Medicaid Other

## 2021-11-25 ENCOUNTER — Other Ambulatory Visit: Payer: Self-pay

## 2021-11-25 VITALS — BP 111/56 | HR 77 | Temp 97.8°F | Resp 19 | Ht 67.5 in | Wt 214.3 lb

## 2021-11-25 DIAGNOSIS — K5903 Drug induced constipation: Secondary | ICD-10-CM

## 2021-11-25 DIAGNOSIS — Z5111 Encounter for antineoplastic chemotherapy: Secondary | ICD-10-CM

## 2021-11-25 DIAGNOSIS — C3491 Malignant neoplasm of unspecified part of right bronchus or lung: Secondary | ICD-10-CM

## 2021-11-25 DIAGNOSIS — Z515 Encounter for palliative care: Secondary | ICD-10-CM

## 2021-11-25 DIAGNOSIS — R300 Dysuria: Secondary | ICD-10-CM

## 2021-11-25 DIAGNOSIS — E119 Type 2 diabetes mellitus without complications: Secondary | ICD-10-CM

## 2021-11-25 DIAGNOSIS — Z5112 Encounter for antineoplastic immunotherapy: Secondary | ICD-10-CM | POA: Diagnosis not present

## 2021-11-25 DIAGNOSIS — G893 Neoplasm related pain (acute) (chronic): Secondary | ICD-10-CM | POA: Diagnosis not present

## 2021-11-25 LAB — CBC WITH DIFFERENTIAL (CANCER CENTER ONLY)
Abs Immature Granulocytes: 0.03 10*3/uL (ref 0.00–0.07)
Basophils Absolute: 0 10*3/uL (ref 0.0–0.1)
Basophils Relative: 0 %
Eosinophils Absolute: 0 10*3/uL (ref 0.0–0.5)
Eosinophils Relative: 1 %
HCT: 28.3 % — ABNORMAL LOW (ref 36.0–46.0)
Hemoglobin: 9.7 g/dL — ABNORMAL LOW (ref 12.0–15.0)
Immature Granulocytes: 1 %
Lymphocytes Relative: 16 %
Lymphs Abs: 0.6 10*3/uL — ABNORMAL LOW (ref 0.7–4.0)
MCH: 31.3 pg (ref 26.0–34.0)
MCHC: 34.3 g/dL (ref 30.0–36.0)
MCV: 91.3 fL (ref 80.0–100.0)
Monocytes Absolute: 0.5 10*3/uL (ref 0.1–1.0)
Monocytes Relative: 14 %
Neutro Abs: 2.6 10*3/uL (ref 1.7–7.7)
Neutrophils Relative %: 68 %
Platelet Count: 207 10*3/uL (ref 150–400)
RBC: 3.1 MIL/uL — ABNORMAL LOW (ref 3.87–5.11)
RDW: 20.8 % — ABNORMAL HIGH (ref 11.5–15.5)
WBC Count: 3.7 10*3/uL — ABNORMAL LOW (ref 4.0–10.5)
nRBC: 0 % (ref 0.0–0.2)

## 2021-11-25 LAB — CMP (CANCER CENTER ONLY)
ALT: 9 U/L (ref 0–44)
AST: 11 U/L — ABNORMAL LOW (ref 15–41)
Albumin: 4.2 g/dL (ref 3.5–5.0)
Alkaline Phosphatase: 78 U/L (ref 38–126)
Anion gap: 5 (ref 5–15)
BUN: 11 mg/dL (ref 6–20)
CO2: 28 mmol/L (ref 22–32)
Calcium: 9 mg/dL (ref 8.9–10.3)
Chloride: 104 mmol/L (ref 98–111)
Creatinine: 0.81 mg/dL (ref 0.44–1.00)
GFR, Estimated: >60 mL/min (ref >60)
Glucose, Bld: 204 mg/dL — ABNORMAL HIGH (ref 70–99)
Potassium: 4 mmol/L (ref 3.5–5.1)
Sodium: 137 mmol/L (ref 135–145)
Total Bilirubin: 0.7 mg/dL (ref 0.3–1.2)
Total Protein: 7.1 g/dL (ref 6.5–8.1)

## 2021-11-25 MED ORDER — SODIUM CHLORIDE 0.9 % IV SOLN: Freq: Once | INTRAVENOUS | Status: AC

## 2021-11-25 MED ORDER — SODIUM CHLORIDE 0.9 % IV SOLN
200.0000 mg | Freq: Once | INTRAVENOUS | Status: AC
  Administered 2021-11-25: 200 mg via INTRAVENOUS
  Filled 2021-11-25: qty 200

## 2021-11-25 MED ORDER — PROCHLORPERAZINE MALEATE 10 MG PO TABS
10.0000 mg | ORAL_TABLET | Freq: Once | ORAL | Status: AC
  Administered 2021-11-25: 10 mg via ORAL
  Filled 2021-11-25: qty 1

## 2021-11-25 MED ORDER — LIDOCAINE-PRILOCAINE 2.5-2.5 % EX CREA: 1.0000 | TOPICAL_CREAM | CUTANEOUS | 2 refills | Status: DC | PRN

## 2021-11-25 MED ORDER — SODIUM CHLORIDE 0.9 % IV SOLN
500.0000 mg/m2 | Freq: Once | INTRAVENOUS | Status: AC
  Administered 2021-11-25: 1100 mg via INTRAVENOUS
  Filled 2021-11-25: qty 40

## 2021-11-25 NOTE — Progress Notes (Signed)
[place]  [phone] [phone]   Name: Cassidy [name] Date: 11/25/2021 MRN: [id number]  DOB: 08-10-1965  Patient Care Team: [name], DO as PCP - General (Family Medicine) O'Neal, Cassie Freer, MD as PCP - Cardiology (Cardiology) Rolm Bookbinder, MD as Consulting Physician (General Surgery)    INTERVAL HISTORY: Cassidy [name] is a 56 y.o. 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  Palliative ask to see for symptom management and goals of care.   SOCIAL HISTORY:     reports that she has been smoking cigarettes. She started smoking about 42 years ago. She has a 21.00 pack-year smoking history. She has never used smokeless tobacco. She reports that she does not currently use drugs. She reports that she does not drink alcohol.  ADVANCE DIRECTIVES:    CODE STATUS:   PAST MEDICAL HISTORY: Past Medical History:  Diagnosis Date   Adenocarcinoma of right lung, stage 4 ([place]) 08/26/2021   Anemia    has sickle cell trait   Atherosclerotic heart disease of native coronary artery with angina pectoris ([place]) 05/2012, 10/2013   a.  s/p PCTA to dRCA and ostial RPAV-PLA vessel + PDA branch (05/2012)  b. [place] s/p DES to mLAD - Resolute DES 3.0 x 22 (3.37mm -->3.3 mm)   Bipolar depression ([place])    Breast abscess    a. right side.    COPD (chronic obstructive pulmonary disease) (HCC)    Depression    Diabetic peripheral neuropathy (HCC)    Fibromyalgia    Genital warts    GERD (gastroesophageal reflux disease)    History of hiatal hernia    HLD (hyperlipidemia)    Hypertension    Pain in limb    a. LE VENOUS DUPLEX, 02/05/2009 - no evidence of deep vein thrombosis, Baker's cyst   Pneumonia    PTSD (post-traumatic stress disorder)    Sickle cell  trait (HCC)    Tobacco abuse    Tooth caries    Type II diabetes mellitus (HCC)     ALLERGIES:  is allergic to amoxil [amoxicillin], chantix [varenicline], suboxone [buprenorphine hcl-naloxone hcl], and emend [fosaprepitant dimeglumine].  MEDICATIONS:  Current Outpatient Medications  Medication Sig Dispense Refill   aspirin EC 81 MG tablet Take 81 mg by mouth daily.     atorvastatin (LIPITOR) 40 MG tablet TAKE 1 TABLET (40 MG TOTAL) BY MOUTH DAILY. 90 tablet 3   busPIRone (BUSPAR) 15 MG tablet TAKE 1/2 TABLET (7.5 MG TOTAL) BY MOUTH TWICE DAILY (Patient taking differently: Take 7.5 mg by mouth 2 (two) times daily.) 30 tablet 11   Carboxymethylcellul-Glycerin (LUBRICATING EYE DROPS OP) Place 1 drop into both eyes daily as needed (dry eyes).     carvedilol (COREG) 12.5 MG tablet TAKE 1 TABLET (12.5 MG TOTAL) BY MOUTH 2 (TWO) TIMES DAILY WITH A MEAL. 180 tablet 1   diclofenac Sodium (VOLTAREN) 1 % GEL Apply 1 application topically 3 times daily as needed for pain 2 g 3   ferrous sulfate 325 (65 FE) MG tablet Take 325 mg by mouth daily with breakfast.     fluticasone (FLONASE) 50 MCG/ACT nasal spray PLACE 1 SPRAY INTO BOTH NOSTRILS DAILY AS NEEDED (CONGESTION). 16 g 1   fluticasone-salmeterol (ADVAIR) 100-50 MCG/ACT AEPB Inhale 1 puff into the lungs 2 (two) times daily. 1 each 3   folic  acid (FOLVITE) 1 MG tablet Take 1 tablet (1 mg total) by mouth daily. 30 tablet 3   furosemide (LASIX) 40 MG tablet Take 1 tablet (40 mg total) by mouth daily as needed. If you gain 3 lbs in 24 hours or 5 lbs in 1 week 30 tablet 1   glucose blood (ACCU-CHEK AVIVA PLUS) test strip Use as instructed 50 each 1   insulin detemir (LEVEMIR FLEXPEN) 100 UNIT/ML FlexPen Inject 15 Units into the skin 2 (two) times daily. 15 mL 1   Insulin Pen Needle 32G X 4 MM MISC 1 Needle by Does not apply route in the morning and at bedtime. 120 each 2   ipratropium (ATROVENT HFA) 17 MCG/ACT inhaler Inhale 2 puffs into the lungs every  4 (four) hours as needed for wheezing (COPD).     lidocaine-prilocaine (EMLA) cream Apply 1 Application topically as needed. 30 g 2   LINZESS 290 MCG CAPS capsule TAKE 1 CAPSULE (290 MCG TOTAL) BY MOUTH DAILY BEFORE BREAKFAST. 30 capsule 3   losartan (COZAAR) 50 MG tablet Take 50 mg by mouth daily.     nicotine polacrilex (NICORETTE) 2 MG gum Take 1 each (2 mg total) by mouth as needed for smoking cessation. 100 tablet 0   nitroGLYCERIN (NITROSTAT) 0.4 MG SL tablet Place 1 tablet (0.4 mg total) under the tongue every 5 (five) minutes as needed for chest pain. 25 tablet 5   nystatin (MYCOSTATIN) 100000 UNIT/ML suspension Take 5 mLs (500,000 Units total) by mouth 4 (four) times daily. 60 mL 0   ondansetron (ZOFRAN) 8 MG tablet Take 1 tablet (8 mg total) by mouth every 8 (eight) hours as needed for nausea or vomiting. 30 tablet 1   oxyCODONE (OXY IR/ROXICODONE) 5 MG immediate release tablet Take 1 tablet (5 mg total) by mouth every 6 (six) hours as needed for severe pain. 60 tablet 0   oxyCODONE ER (XTAMPZA ER) 9 MG C12A Take 9 mg by mouth every 12 (twelve) hours. 60 capsule 0   pantoprazole (PROTONIX) 40 MG tablet TAKE 1 TABLET (40 MG TOTAL) BY MOUTH DAILY. 30 tablet 1   polyethylene glycol powder (GLYCOLAX/MIRALAX) 17 GM/SCOOP powder Take 17 g by mouth daily as needed for mild constipation or moderate constipation. 3350 g 1   potassium chloride SA (KLOR-CON M) 20 MEQ tablet Take 1 tablet (20 mEq total) by mouth daily as needed. On days you take Lasix (Patient not taking: Reported on 11/25/2021) 30 tablet 1   prochlorperazine (COMPAZINE) 10 MG tablet Take 1 tablet (10 mg total) by mouth every 6 (six) hours as needed for nausea or vomiting. 30 tablet 0   sertraline (ZOLOFT) 50 MG tablet TAKE 1 TABLET (50 MG TOTAL) BY MOUTH DAILY. 30 tablet 3   valACYclovir (VALTREX) 1000 MG tablet TAKE FOR 3 DAYS AS NEEDED FOR EACH OUTBREAK 30 tablet 11   vitamin B-12 (CYANOCOBALAMIN) 1000 MCG tablet Take 1,000 mcg by  mouth daily.     No current facility-administered medications for this visit.    VITAL SIGNS: LMP 06/12/2016 (Approximate)  There were no vitals filed for this visit.  Estimated body mass index is 33.07 kg/m as calculated from the following:   Height as of an earlier encounter on 11/25/21: 5' 7.5" (1.715 m).   Weight as of an earlier encounter on 11/25/21: 214 lb 4.8 oz (97.2 kg).   PERFORMANCE STATUS (ECOG) : 1 - Symptomatic but completely ambulatory   Physical Exam General: NAD Pulmonary: normal breathing pattern  Extremities: no edema, no joint deformities Skin: no rashes Neurological: AAO x3, mood appropriate   IMPRESSION:  I saw Ms. Gottlieb during her infusion. She is feeling great. Shares her excitement of being told today that her treatments are showing significant response.  Emotional support provided.  She is excited to be able to share this news with her family when she gets home today and is looking forward to enjoying things given with a peace of mind. Appetite is good.   Neoplasm related pain Kema shares her pain is much better and well controlled.  Over the past week she has been able to increase her activity to the point she cleaned out her closets.  She is much appreciative of this and her improved quality of life.  We discussed at length her current pain and regimen.  She is tolerating Xtampza 9 mg every 12 hours and oxycodone 5 mg every 6 hours as needed for breakthrough pain.  Does not require Oxy IR as often.  No changes needed at this time.  She understands we will continue to closely monitor and adjust as needed.  2. Constipation No concerns with constipation.  Actively taking Linzess.   I discussed the importance of continued conversation with family and their medical providers regarding overall plan of care and treatment options, ensuring decisions are within the context of the patients values and GOCs.  PLAN: Continue Xtampza 9 mg every 12 hours Oxy  IR 5 mg every 6 hours as needed for breakthrough pain Linzess and MiraLAX for bowel regimen I will plan to see patient back in 3-4 weeks in collaboration with her oncology appointments.   Patient expressed understanding and was in agreement with this plan. She also understands that She can call the clinic at any time with any questions, concerns, or complaints.   Any controlled substances utilized were prescribed in the context of palliative care. PDMP has been reviewed.   Time Total: 20 min   Visit consisted of counseling and education dealing with the complex and emotionally intense issues of symptom management and palliative care in the setting of serious and potentially life-threatening illness.Greater than 50%  of this time was spent counseling and coordinating care related to the above assessment and plan.  Alda Lea, AGPCNP-BC  Palliative Medicine Team/[facility]

## 2021-11-30 ENCOUNTER — Telehealth: Payer: Self-pay

## 2021-11-30 NOTE — Telephone Encounter (Signed)
Cassidy [name], counseling intern, spoke with patient on the phone. The patient canceled their counseling session because they are feeling ill from chemo. The patient shared they are not feeling well because their baby cousin died this past weekend. The patient reported their son had visited them this morning and their brother was on their way over to visit for the afternoon.   The counselor told the patient they would call the patient next week to reschedule.   Cassidy [name],  Counseling Intern  [phone] [email or web address]

## 2021-12-02 ENCOUNTER — Other Ambulatory Visit: Payer: Medicaid Other

## 2021-12-05 ENCOUNTER — Other Ambulatory Visit: Payer: Self-pay | Admitting: Internal Medicine

## 2021-12-07 ENCOUNTER — Telehealth: Payer: Self-pay

## 2021-12-07 ENCOUNTER — Ambulatory Visit
Admission: RE | Admit: 2021-12-07 | Discharge: 2021-12-07 | Disposition: A | Discharge status: Home / Self Care | Payer: Medicaid Other | Source: Ambulatory Visit | Attending: Radiation Oncology | Admitting: Radiation Oncology

## 2021-12-07 ENCOUNTER — Other Ambulatory Visit: Payer: Self-pay | Admitting: Radiology

## 2021-12-07 NOTE — Consult Note (Incomplete)
Chief Complaint: Patient was seen in consultation today for port a cath placement  Referring Physician(s): Mohamed,M  Supervising Physician: Michaelle Birks  Patient Status: [facility] - Out-pt  History of Present Illness: Cassidy [name] is a 56 y.o. 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 She presented with right upper lobe lung mass in addition to right hilar and mediastinal lymphadenopathy and innumerable bilateral pulmonary nodules as well as liver and bone metastasis.  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 She is scheduled today for port a cath placement to assist with treatment.   Past Medical History:  Diagnosis Date   Adenocarcinoma of right lung, stage 4 ([place]) 08/26/2021   Anemia    has sickle cell trait   Atherosclerotic heart disease of native coronary artery with angina pectoris ([place]) 05/2012, 10/2013   a.  s/p PCTA to dRCA and ostial RPAV-PLA vessel + PDA branch (05/2012)  b. [place] s/p DES to mLAD - Resolute DES 3.0 x 22 (3.52mm -->3.3 mm)   Bipolar depression ([name])    Breast abscess    a. right side.    COPD (chronic obstructive pulmonary disease) (HCC)    Depression    Diabetic peripheral neuropathy (HCC)    Fibromyalgia    Genital warts    GERD (gastroesophageal reflux disease)    History of hiatal hernia    HLD (hyperlipidemia)    Hypertension    Pain in limb    a. LE VENOUS DUPLEX, 02/05/2009 - no evidence of deep vein thrombosis, Baker's cyst   Pneumonia    PTSD (post-traumatic stress disorder)    Sickle cell trait ([place])    Tobacco abuse    Tooth caries    Type II diabetes mellitus ([place])     Past Surgical History:  Procedure Laterality Date   BLADDER SURGERY  1980   "TVT"   BLADDER SURGERY  2003   "TVT"   BREAST EXCISIONAL BIOPSY     BREAST SURGERY Right    I&D for multiple  abscesses   CARDIAC CATHETERIZATION     CORONARY ARTERY BYPASS GRAFT N/A 06/30/2021   Procedure: CORONARY ARTERY BYPASS GRAFTING (CABG) x3 USING LEFT INTERNAL MAMMARY ARTERY,  RIGHT RADIAL ARTERY AND LEFT GREATER SAPHENOUS VEIN;  Surgeon: Lajuana Matte, MD;  Location: [place];  Service: Open Heart Surgery;  Laterality: N/A;   cutting balloon     ENDOVEIN HARVEST OF GREATER SAPHENOUS VEIN Left 06/30/2021   Procedure: ENDOVEIN HARVEST OF GREATER SAPHENOUS VEIN;  Surgeon: Lajuana Matte, MD;  Location: [place];  Service: Open Heart Surgery;  Laterality: Left;   EYE SURGERY Left    laser surgery   FINE NEEDLE ASPIRATION  08/21/2021   Procedure: FINE NEEDLE ASPIRATION (FNA) LINEAR;  Surgeon: Garner Nash, DO;  Location: [facility];  Service: Pulmonary;;   INCISE AND DRAIN ABCESS  ; 04/26/11; 08/13/11   right breast   INCISION AND DRAINAGE ABSCESS Right 05/09/2012   Procedure: INCISION AND DRAINAGE RIGHT BREAST ABSCESS;  Surgeon: Imogene Burn. Georgette Dover, MD;  Location: [place];  Service: General;  Laterality: Right;   INCISION AND DRAINAGE ABSCESS Right 02/08/2014   Procedure: INCISION AND DRAINAGE RIGHT BREAST ABSCESS;  Surgeon: Donnie Mesa, MD;  Location: [place];  Service: General;  Laterality: Right;   INCISION AND DRAINAGE ABSCESS Right 06/14/2014   Procedure: INCISION AND  DRAINAGE RIGHT BREAST ABSCESS;  Surgeon: Donnie Mesa, MD;  Location: [place];  Service: General;  Laterality: Right;   IRRIGATION AND DEBRIDEMENT ABSCESS  12/19/2010   Procedure: IRRIGATION AND DEBRIDEMENT ABSCESS;  Surgeon: Rolm Bookbinder, MD;  Location: [place];  Service: General;  Laterality: Right;   IRRIGATION AND DEBRIDEMENT ABSCESS  08/13/2011   Procedure: MINOR INCISION AND DRAINAGE OF ABSCESS;  Surgeon: Gwenyth Ober, MD;  Location: [place];  Service: General;  Laterality: Right;  Right Breast    IRRIGATION AND DEBRIDEMENT ABSCESS  11/17/2011   Procedure: IRRIGATION AND DEBRIDEMENT ABSCESS;  Surgeon: Imogene Burn.  Georgette Dover, MD;  Location: [place];  Service: General;  Laterality: Right;  irrigation and debridement right recurrent breast abscess   LARYNX SURGERY     LEFT HEART CATH AND CORONARY ANGIOGRAPHY N/A 04/09/2021   Procedure: LEFT HEART CATH AND CORONARY ANGIOGRAPHY;  Surgeon: Jettie Booze, MD;  Location: [place] [facility];  Service: Cardiovascular;  Laterality: N/A;   LEFT HEART CATHETERIZATION WITH CORONARY ANGIOGRAM N/A 05/29/2012   Procedure: LEFT HEART CATHETERIZATION WITH CORONARY ANGIOGRAM & PTCA;  Surgeon: Troy Sine, MD;  Location: [facility];  Service: Cardiovascular; Bifurcation dRCA-RPAD/PLA & rPDA  PTCA.   LEFT HEART CATHETERIZATION WITH CORONARY ANGIOGRAM N/A 11/08/2013   Procedure: LEFT HEART CATHETERIZATION WITH CORONARY ANGIOGRAM and Coronary Stent Intervention;  Surgeon: Leonie Man, MD;  Location: [facility];  Service: Cardiovascular; mLAD 80% (FFR 0.73) - resolute DES 3.0 x 22 mm (postdilated to 3.3 mm->3.5 mm)   NM MYOVIEW LTD  01/26/2009   Normal study, no evidence of ischemia, EF 67%   ployp removed from voice box 03/30/12     RADIAL ARTERY HARVEST Right 06/30/2021   Procedure: RADIAL ARTERY HARVEST;  Surgeon: Lajuana Matte, MD;  Location: [place];  Service: Open Heart Surgery;  Laterality: Right;   REFRACTIVE SURGERY  ~ 2010   right   TEE WITHOUT CARDIOVERSION N/A 06/30/2021   Procedure: TRANSESOPHAGEAL ECHOCARDIOGRAM (TEE);  Surgeon: Lajuana Matte, MD;  Location: [place];  Service: Open Heart Surgery;  Laterality: N/A;   TONSILLECTOMY  1988   VIDEO BRONCHOSCOPY WITH ENDOBRONCHIAL ULTRASOUND Bilateral 08/21/2021   Procedure: VIDEO BRONCHOSCOPY WITH ENDOBRONCHIAL ULTRASOUND;  Surgeon: Garner Nash, DO;  Location: [place];  Service: Pulmonary;  Laterality: Bilateral;    Allergies: Amoxil [amoxicillin], Chantix [varenicline], Suboxone [buprenorphine hcl-naloxone hcl], and Emend [fosaprepitant dimeglumine]  Medications: Prior to Admission  medications   Medication Sig Start Date End Date Taking? Authorizing Provider  aspirin EC 81 MG tablet Take 81 mg by mouth daily.    [provider]  atorvastatin (LIPITOR) 40 MG tablet TAKE 1 TABLET (40 MG TOTAL) BY MOUTH DAILY. 04/14/21   [name], DO  busPIRone (BUSPAR) 15 MG tablet TAKE 1/2 TABLET (7.5 MG TOTAL) BY MOUTH TWICE DAILY Patient taking differently: Take 7.5 mg by mouth 2 (two) times daily. 04/14/21   [name], DO  Carboxymethylcellul-Glycerin (LUBRICATING EYE DROPS OP) Place 1 drop into both eyes daily as needed (dry eyes).    [provider]  carvedilol (COREG) 12.5 MG tablet TAKE 1 TABLET (12.5 MG TOTAL) BY MOUTH 2 (TWO) TIMES DAILY WITH A MEAL. 10/28/21   O'Neal, Cassie Freer, MD  diclofenac Sodium (VOLTAREN) 1 % GEL Apply 1 application topically 3 times daily as needed for pain 08/31/21   O'Neal, Cassie Freer, MD  ferrous sulfate 325 (65 FE) MG tablet Take 325 mg by mouth daily with breakfast.  [provider]  fluticasone (FLONASE) 50 MCG/ACT nasal spray PLACE 1 SPRAY INTO BOTH NOSTRILS DAILY AS NEEDED (CONGESTION). 05/13/21   [name], DO  fluticasone-salmeterol (ADVAIR) 100-50 MCG/ACT AEPB Inhale 1 puff into the lungs 2 (two) times daily. 11/23/21   [name], DO  folic acid (FOLVITE) 1 MG tablet TAKE 1 TABLET (1 MG TOTAL) BY MOUTH DAILY. 12/06/21   Heilingoetter, Cassandra L, PA-C  furosemide (LASIX) 40 MG tablet Take 1 tablet (40 mg total) by mouth daily as needed. If you gain 3 lbs in 24 hours or 5 lbs in 1 week 07/17/21   Barrett, Erin R, PA-C  glucose blood (ACCU-CHEK AVIVA PLUS) test strip Use as instructed 10/27/21   [name], DO  insulin detemir (LEVEMIR FLEXPEN) 100 UNIT/ML FlexPen Inject 15 Units into the skin 2 (two) times daily. 11/23/21   [name], DO  Insulin Pen Needle 32G X 4 MM MISC 1 Needle by Does not apply route in the morning and at bedtime. 11/23/21   [name], DO  ipratropium (ATROVENT HFA) 17 MCG/ACT inhaler Inhale 2 puffs into the lungs every 4 (four) hours as needed for wheezing (COPD).    [provider]  lidocaine-prilocaine (EMLA) cream Apply 1 Application topically as needed. 11/25/21   Heilingoetter, Cassandra L, PA-C  LINZESS 290 MCG CAPS capsule TAKE 1 CAPSULE (290 MCG TOTAL) BY MOUTH DAILY BEFORE BREAKFAST. 05/13/21   Nandigam, Venia Minks, MD  losartan (COZAAR) 50 MG tablet Take 50 mg by mouth daily.    [provider]  nicotine polacrilex (NICORETTE) 2 MG gum Take 1 each (2 mg total) by mouth as needed for smoking cessation. 06/03/21   [name], DO  nitroGLYCERIN (NITROSTAT) 0.4 MG SL tablet Place 1 tablet (0.4 mg total) under the tongue every 5 (five) minutes as needed for chest pain. 04/24/20   [name], DO  nystatin (MYCOSTATIN) 100000 UNIT/ML suspension Take 5 mLs (500,000 Units total) by mouth 4 (four) times daily. 09/28/21   Walisiewicz, Kaitlyn E, PA-C  ondansetron (ZOFRAN) 8 MG tablet Take 1 tablet (8 mg total) by mouth every 8 (eight) hours as needed for nausea or vomiting. 11/11/21   Pickenpack-Cousar, Carlena Sax, NP  oxyCODONE (OXY IR/ROXICODONE) 5 MG immediate release tablet Take 1 tablet (5 mg total) by mouth every 6 (six) hours as needed for severe pain. 11/11/21   Pickenpack-Cousar, Carlena Sax, NP  oxyCODONE ER (XTAMPZA ER) 9 MG C12A Take 9 mg by mouth every 12 (twelve) hours. 11/11/21   Pickenpack-Cousar, Carlena Sax, NP  pantoprazole (PROTONIX) 40 MG tablet TAKE 1 TABLET (40 MG TOTAL) BY MOUTH DAILY. 11/11/21   Curt Bears, MD  polyethylene glycol powder [facility]) 17 GM/SCOOP powder Take 17 g by mouth daily as needed for mild constipation or moderate constipation. 11/23/21   [name], DO  potassium chloride SA (KLOR-CON M) 20 MEQ tablet Take 1 tablet (20 mEq total) by mouth daily as needed. On days you take Lasix Patient not taking: Reported on 11/25/2021 07/17/21    Barrett, Lodema Hong, PA-C  prochlorperazine (COMPAZINE) 10 MG tablet Take 1 tablet (10 mg total) by mouth every 6 (six) hours as needed for nausea or vomiting. 11/04/21   Curt Bears, MD  sertraline (ZOLOFT) 50 MG tablet TAKE 1 TABLET (50 MG TOTAL) BY MOUTH DAILY. 08/25/21   [name], DO  valACYclovir (VALTREX) 1000 MG tablet TAKE FOR 3 DAYS AS NEEDED FOR EACH OUTBREAK 04/14/21   [name], DO  vitamin B-12 (CYANOCOBALAMIN)  1000 MCG tablet Take 1,000 mcg by mouth daily.    [provider]     Family History  Problem Relation Age of Onset   Esophageal cancer Mother    COPD Mother    Cancer Mother        lymphoma   Heart disease Mother    Stomach cancer Mother    Hyperlipidemia Father    Heart disease Father    Cancer Maternal Aunt        breast, colon   Crohn's disease Maternal Aunt    Hypertension Maternal Grandmother    Diabetes Maternal Grandmother    Hypertension Brother    Rectal cancer Neg Hx    Liver cancer Neg Hx    Colon cancer Neg Hx    Breast cancer Neg Hx     Social History   Socioeconomic History   Marital status: Single    Spouse name: Not on file   Number of children: 1   Years of education: Not on file   Highest education level: Not on file  Occupational History   Occupation: homemaker   Occupation: disabled  Tobacco Use   Smoking status: Every Day    Packs/day: 0.50    Years: 42.00    Total pack years: 21.00    Types: Cigarettes    Start date: 01/12/1979   Smokeless tobacco: Never   Tobacco comments:    08/14/11 "quit for 3 wk 05/2011; was smoking 1 ppd since age 7 before I quit; at least I've cut down to 1/4 ppd",  Smoking 3 per day 08/20/2021. 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, marijuana, crack cocaine)   Drug use: Not Currently    Comment: 08/14/11 'quit  everything in 2005"   Sexual activity: Yes    Partners: Male    Birth control/protection: Condom  Other Topics Concern   Not on file  Social History Narrative   Work: disability   Children: son, 79 yrs old   Regular exercise: some/ heart attack in May/ walks 1 mile 3 days a week   Caffeine use: daily, cup of coffee daily   Social Determinants of Health   Financial Resource Strain: High Risk (09/08/2021)   Overall Financial Resource Strain (CARDIA)    Difficulty of Paying Living Expenses: Very hard  Food Insecurity: Not on file  Transportation Needs: Not on file  Physical Activity: Not on file  Stress: Not on file  Social Connections: Not on file     Review of Systems  Vital Signs: LMP 06/12/2016 (Approximate)     Physical Exam  Imaging: CT Chest W Contrast  Result Date: 11/24/2021 CLINICAL DATA:  Non-small cell lung cancer restaging * Tracking Code: BO * EXAM: CT CHEST, ABDOMEN, AND PELVIS WITH CONTRAST TECHNIQUE: Multidetector CT imaging of the chest, abdomen and pelvis was performed following the standard protocol during bolus administration of intravenous contrast. RADIATION DOSE REDUCTION: This exam was performed according to the departmental dose-optimization program which includes automated exposure control, adjustment of the mA and/or kV according to patient size and/or use of iterative reconstruction technique. CONTRAST:  127mL OMNIPAQUE IOHEXOL 300 MG/ML  SOLN COMPARISON:  CT chest angiogram, 09/29/2021, PET-CT, 08/28/2021 CT abdomen pelvis, 05/22/2015 FINDINGS: CT CHEST FINDINGS Cardiovascular: Aortic atherosclerosis. Normal heart size. Three-vessel coronary artery calcifications status post median sternotomy  and CABG. No pericardial effusion. Mediastinum/Nodes: Unchanged enlarged right hilar lymph node measuring 1.9 x 1.7 cm (series 2, image 27). Other right hilar lymph nodes are diminished in size (series 2, image 71). Diminished size of subcarinal lymph node  measuring 2.0 x 1.0 cm, previously 2.8 x 1.6 cm (series 2, image 29). Otherwise matted appearance of treated soft tissue. Thyroid gland, trachea, and esophagus demonstrate no significant findings. Lungs/Pleura: Diminished size of a thick-walled cavitary nodule of the peripheral right upper lobe, measuring 2.0 x 1.3 cm, previously 2.8 x 2.0 cm (series 6, image 44). Multiple additional smaller bilateral pulmonary nodules are essentially completely resolved, for example with only a faint irregular residua of a nodule seen in the posterior left upper lobe (series 6, image 70). Background of very fine centrilobular nodules, most concentrated in the lung apices. No pleural effusion or pneumothorax. Musculoskeletal: Status post median sternotomy and CABG, with partial healing and callus formation. Subacute, partially callused fracture of the lateral left sixth rib, with new sclerosis, previously FDG avid (series 2, image 37). CT ABDOMEN PELVIS FINDINGS Hepatobiliary: Diminished size of a hypodense, previously FDG avid lesion of the tip of the left lobe of the liver, hepatic segment II/III measuring 0.9 x 0.8 cm, previously 1.9 x 1.6 cm (series 2, image 55) as well as a hypodense, previously FDG avid lesion of the inferior right lobe of the liver, hepatic segment VI, measuring 0.6 cm, previously 1.5 x 1.1 cm (series 2, image 78). Additional hypodense lesions of the posterior liver dome, hepatic segment VIII, are not significantly changed, these not previously FDG avid and stable in comparison to remote prior examination dated 05/22/2015, slightly photopenic and most likely benign cysts or hemangiomata (series 2, image 5153). No gallstones, gallbladder wall thickening, or biliary dilatation. Pancreas: Unremarkable. No pancreatic ductal dilatation or surrounding inflammatory changes. Spleen: Normal in size without significant abnormality. Adrenals/Urinary Tract: Stable, definitively benign right adrenal adenoma, not  previously FDG avid and unchanged in comparison to remote prior examination dated 05/22/2015, no further follow-up or characterization required. Kidneys are normal, without renal calculi, solid lesion, or hydronephrosis. Diffuse urinary bladder wall thickening. Stomach/Bowel: Stomach is within normal limits. Appendix appears normal. No evidence of bowel wall thickening, distention, or inflammatory changes. Large burden of stool throughout the colon. Vascular/Lymphatic: Aortic atherosclerosis. No enlarged abdominal or pelvic lymph nodes. Reproductive: No mass or other abnormality. Other: No abdominal wall hernia or abnormality. No ascites. Musculoskeletal: No acute osseous findings. New sclerosis of multiple scattered previously FDG avid osseous metastatic lesions, including of the L4 vertebral body (series 2, image 81), the S1 segment (series 2, image 89), in the proximal right femur (series 2, image 117). IMPRESSION: 1. When compared to most recent imaging of the chest dated 09/29/2021, diminished size of a thick-walled cavitary nodule of the peripheral right upper lobe. 2. Multiple additional smaller bilateral pulmonary nodules are essentially completely resolved. 3. Stable to slightly diminished mediastinal and right hilar lymphadenopathy. 4. When compared to most recent imaging of the abdomen dated 08/28/2021, diminished size of metastatic lesions of the liver. 5. New post treatment sclerosis of multiple scattered previously lytic and FDG avid osseous metastatic lesions. 6. Findings are consistent with treatment response of primary lung malignancy, pulmonary, nodal, hepatic, and osseous metastatic disease. 7. Background of very fine centrilobular nodules, most concentrated in the lung apices, consistent with smoking-related respiratory bronchiolitis. 8. Diffuse urinary bladder wall thickening, suggestive of nonspecific infectious or inflammatory cystitis. Correlate with urinalysis. 9. Coronary artery disease.  Aortic  Atherosclerosis (ICD10-I70.0). Electronically Signed   By: Delanna Ahmadi M.D.   On: 11/24/2021 16:44   CT Abdomen Pelvis W Contrast  Result Date: 11/24/2021 CLINICAL DATA:  Non-small cell lung cancer restaging * Tracking Code: BO * EXAM: CT CHEST, ABDOMEN, AND PELVIS WITH CONTRAST TECHNIQUE: Multidetector CT imaging of the chest, abdomen and pelvis was performed following the standard protocol during bolus administration of intravenous contrast. RADIATION DOSE REDUCTION: This exam was performed according to the departmental dose-optimization program which includes automated exposure control, adjustment of the mA and/or kV according to patient size and/or use of iterative reconstruction technique. CONTRAST:  152mL OMNIPAQUE IOHEXOL 300 MG/ML  SOLN COMPARISON:  CT chest angiogram, 09/29/2021, PET-CT, 08/28/2021 CT abdomen pelvis, 05/22/2015 FINDINGS: CT CHEST FINDINGS Cardiovascular: Aortic atherosclerosis. Normal heart size. Three-vessel coronary artery calcifications status post median sternotomy and CABG. No pericardial effusion. Mediastinum/Nodes: Unchanged enlarged right hilar lymph node measuring 1.9 x 1.7 cm (series 2, image 27). Other right hilar lymph nodes are diminished in size (series 2, image 71). Diminished size of subcarinal lymph node measuring 2.0 x 1.0 cm, previously 2.8 x 1.6 cm (series 2, image 29). Otherwise matted appearance of treated soft tissue. Thyroid gland, trachea, and esophagus demonstrate no significant findings. Lungs/Pleura: Diminished size of a thick-walled cavitary nodule of the peripheral right upper lobe, measuring 2.0 x 1.3 cm, previously 2.8 x 2.0 cm (series 6, image 44). Multiple additional smaller bilateral pulmonary nodules are essentially completely resolved, for example with only a faint irregular residua of a nodule seen in the posterior left upper lobe (series 6, image 70). Background of very fine centrilobular nodules, most concentrated in the lung apices. No  pleural effusion or pneumothorax. Musculoskeletal: Status post median sternotomy and CABG, with partial healing and callus formation. Subacute, partially callused fracture of the lateral left sixth rib, with new sclerosis, previously FDG avid (series 2, image 37). CT ABDOMEN PELVIS FINDINGS Hepatobiliary: Diminished size of a hypodense, previously FDG avid lesion of the tip of the left lobe of the liver, hepatic segment II/III measuring 0.9 x 0.8 cm, previously 1.9 x 1.6 cm (series 2, image 55) as well as a hypodense, previously FDG avid lesion of the inferior right lobe of the liver, hepatic segment VI, measuring 0.6 cm, previously 1.5 x 1.1 cm (series 2, image 78). Additional hypodense lesions of the posterior liver dome, hepatic segment VIII, are not significantly changed, these not previously FDG avid and stable in comparison to remote prior examination dated 05/22/2015, slightly photopenic and most likely benign cysts or hemangiomata (series 2, image 5153). No gallstones, gallbladder wall thickening, or biliary dilatation. Pancreas: Unremarkable. No pancreatic ductal dilatation or surrounding inflammatory changes. Spleen: Normal in size without significant abnormality. Adrenals/Urinary Tract: Stable, definitively benign right adrenal adenoma, not previously FDG avid and unchanged in comparison to remote prior examination dated 05/22/2015, no further follow-up or characterization required. Kidneys are normal, without renal calculi, solid lesion, or hydronephrosis. Diffuse urinary bladder wall thickening. Stomach/Bowel: Stomach is within normal limits. Appendix appears normal. No evidence of bowel wall thickening, distention, or inflammatory changes. Large burden of stool throughout the colon. Vascular/Lymphatic: Aortic atherosclerosis. No enlarged abdominal or pelvic lymph nodes. Reproductive: No mass or other abnormality. Other: No abdominal wall hernia or abnormality. No ascites. Musculoskeletal: No acute  osseous findings. New sclerosis of multiple scattered previously FDG avid osseous metastatic lesions, including of the L4 vertebral body (series 2, image 81), the S1 segment (series 2, image 89), in the proximal right femur (series 2,  image 117). IMPRESSION: 1. When compared to most recent imaging of the chest dated 09/29/2021, diminished size of a thick-walled cavitary nodule of the peripheral right upper lobe. 2. Multiple additional smaller bilateral pulmonary nodules are essentially completely resolved. 3. Stable to slightly diminished mediastinal and right hilar lymphadenopathy. 4. When compared to most recent imaging of the abdomen dated 08/28/2021, diminished size of metastatic lesions of the liver. 5. New post treatment sclerosis of multiple scattered previously lytic and FDG avid osseous metastatic lesions. 6. Findings are consistent with treatment response of primary lung malignancy, pulmonary, nodal, hepatic, and osseous metastatic disease. 7. Background of very fine centrilobular nodules, most concentrated in the lung apices, consistent with smoking-related respiratory bronchiolitis. 8. Diffuse urinary bladder wall thickening, suggestive of nonspecific infectious or inflammatory cystitis. Correlate with urinalysis. 9. Coronary artery disease. Aortic Atherosclerosis (ICD10-I70.0). Electronically Signed   By: Delanna Ahmadi M.D.   On: 11/24/2021 16:44    Labs:  CBC: Recent Labs    11/04/21 1041 11/11/21 1327 11/18/21 1351 11/25/21 1049  WBC 3.3* 1.2* 2.6* 3.7*  HGB 11.1* 10.1* 9.0* 9.7*  HCT 31.7* 29.2* 26.3* 28.3*  PLT 204 139* 126* 207    COAGS: Recent Labs    06/26/21 1159 06/30/21 1709  INR 0.9 1.4*  APTT 31 30    BMP: Recent Labs    11/04/21 1041 11/11/21 1327 11/18/21 1351 11/25/21 1049  NA 136 137 137 137  K 4.0 3.9 3.8 4.0  CL 104 104 104 104  CO2 24 27 25 28   GLUCOSE 310* 238* 167* 204*  BUN 12 14 13 11   CALCIUM 8.8* 9.1 8.7* 9.0  CREATININE 0.75 0.72 0.87  0.81  GFRNONAA >60 >60 >60 >60    LIVER FUNCTION TESTS: Recent Labs    11/04/21 1041 11/11/21 1327 11/18/21 1351 11/25/21 1049  BILITOT 0.6 0.5 0.5 0.7  AST 13* 11* 14* 11*  ALT 15 11 12 9   ALKPHOS 84 88 79 78  PROT 6.6 7.2 6.9 7.1  ALBUMIN 3.9 4.1 3.8 4.2    TUMOR MARKERS: No results for input(s): "AFPTM", "CEA", "CA199", "CHROMGRNA" in the last 8760 hours.  Assessment and Plan: 56 y.o. 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 She presented with right upper lobe lung mass in addition to right hilar and mediastinal lymphadenopathy and innumerable bilateral pulmonary nodules as well as liver and bone metastasis.  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 She is scheduled today for port a cath placement to assist with treatment. Risks and benefits of image guided port-a-catheter placement was discussed with the patient including, but not limited to bleeding, infection, pneumothorax, or fibrin sheath development and need for additional procedures.  All of the patient's questions were answered, patient is agreeable to proceed. Consent signed and in chart.    Thank you for this interesting consult.  I greatly enjoyed meeting DAJANE VALLI and look forward to participating in their care.  A copy of this report was sent to the requesting provider on this date.  Electronically Signed: D. Rowe Robert, PA-C 12/07/2021, 2:51 PM   I spent a total of     in face to face in clinical consultation, greater than 50% of which was counseling/coordinating care for port a cath placement

## 2021-12-07 NOTE — Progress Notes (Addendum)
  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  The patient did note fatigue during radiation but has since improved. The patient did not note skin changes in the field of radiation during therapy. The patient has noticed improvement in pain in the area(s) treated with radiation. The patient is not taking dexamethasone. The patient does not have symptoms of  weakness or loss of control of the extremities. The patient does not have symptoms of headache. The patient does not have symptoms of seizure or uncontrolled movement. The patient does not have symptoms of changes in vision. The patient does not have changes in speech. The patient does not have confusion.  The patient is scheduled for ongoing care with Dr. Curt Bears in medical oncology. The patient was encouraged to call if she develop concerns or questions regarding radiation.  This concludes the interview.   Leandra Kern, LPN

## 2021-12-07 NOTE — Telephone Encounter (Signed)
Cassidy [name], counseling intern, called patient to check in with them after they expressed they had a hard week last week.   The patient reported they were able to attend their cousin's funeral. The patient reported the family released balloons in her honor.    The patient reported they had a joyful Thanksgiving with their son and son's girlfriend.   The patient scheduled their next counseling session for Monday, December 4th at [place],  Counseling Intern  [phone] [email or web address]

## 2021-12-08 ENCOUNTER — Other Ambulatory Visit: Payer: Self-pay

## 2021-12-08 ENCOUNTER — Encounter (HOSPITAL_COMMUNITY): Payer: Self-pay

## 2021-12-08 ENCOUNTER — Ambulatory Visit (HOSPITAL_COMMUNITY)
Admission: RE | Admit: 2021-12-08 | Discharge: 2021-12-08 | Disposition: A | Discharge status: Home / Self Care | Payer: Medicaid Other | Source: Ambulatory Visit

## 2021-12-08 ENCOUNTER — Ambulatory Visit (HOSPITAL_COMMUNITY)
Admission: RE | Admit: 2021-12-08 | Discharge: 2021-12-08 | Disposition: A | Discharge status: Home / Self Care | Payer: Medicaid Other | Source: Ambulatory Visit | Attending: Physician Assistant | Admitting: Physician Assistant

## 2021-12-08 DIAGNOSIS — J449 Chronic obstructive pulmonary disease, unspecified: Secondary | ICD-10-CM | POA: Diagnosis not present

## 2021-12-08 DIAGNOSIS — C7951 Secondary malignant neoplasm of bone: Secondary | ICD-10-CM | POA: Insufficient documentation

## 2021-12-08 DIAGNOSIS — D573 Sickle-cell trait: Secondary | ICD-10-CM | POA: Insufficient documentation

## 2021-12-08 DIAGNOSIS — Z452 Encounter for adjustment and management of vascular access device: Secondary | ICD-10-CM | POA: Diagnosis not present

## 2021-12-08 DIAGNOSIS — C787 Secondary malignant neoplasm of liver and intrahepatic bile duct: Secondary | ICD-10-CM | POA: Diagnosis not present

## 2021-12-08 DIAGNOSIS — E785 Hyperlipidemia, unspecified: Secondary | ICD-10-CM | POA: Insufficient documentation

## 2021-12-08 DIAGNOSIS — Z955 Presence of coronary angioplasty implant and graft: Secondary | ICD-10-CM | POA: Insufficient documentation

## 2021-12-08 DIAGNOSIS — I10 Essential (primary) hypertension: Secondary | ICD-10-CM | POA: Insufficient documentation

## 2021-12-08 DIAGNOSIS — I2510 Atherosclerotic heart disease of native coronary artery without angina pectoris: Secondary | ICD-10-CM | POA: Insufficient documentation

## 2021-12-08 DIAGNOSIS — Z87891 Personal history of nicotine dependence: Secondary | ICD-10-CM | POA: Diagnosis not present

## 2021-12-08 DIAGNOSIS — F4310 Post-traumatic stress disorder, unspecified: Secondary | ICD-10-CM | POA: Diagnosis not present

## 2021-12-08 DIAGNOSIS — C3491 Malignant neoplasm of unspecified part of right bronchus or lung: Secondary | ICD-10-CM | POA: Diagnosis not present

## 2021-12-08 DIAGNOSIS — E119 Type 2 diabetes mellitus without complications: Secondary | ICD-10-CM | POA: Insufficient documentation

## 2021-12-08 DIAGNOSIS — Z951 Presence of aortocoronary bypass graft: Secondary | ICD-10-CM | POA: Diagnosis not present

## 2021-12-08 HISTORY — PX: IR IMAGING GUIDED PORT INSERTION: IMG5740

## 2021-12-08 LAB — GLUCOSE, CAPILLARY: Glucose-Capillary: 150 mg/dL — ABNORMAL HIGH (ref 70–99)

## 2021-12-08 MED ORDER — LIDOCAINE-EPINEPHRINE 1 %-1:100000 IJ SOLN
INTRAMUSCULAR | Status: AC
  Administered 2021-12-08: 10 mL via INTRADERMAL
  Filled 2021-12-08: qty 1

## 2021-12-08 MED ORDER — SODIUM CHLORIDE 0.9 % IV SOLN: INTRAVENOUS | Status: DC

## 2021-12-08 MED ORDER — LIDOCAINE HCL (PF) 1 % IJ SOLN
INTRAMUSCULAR | Status: AC | PRN
  Administered 2021-12-08: 10 mL

## 2021-12-08 MED ORDER — FENTANYL CITRATE (PF) 100 MCG/2ML IJ SOLN
INTRAMUSCULAR | Status: AC | PRN
  Administered 2021-12-08 (×3): 50 ug via INTRAVENOUS

## 2021-12-08 MED ORDER — FENTANYL CITRATE (PF) 100 MCG/2ML IJ SOLN
INTRAMUSCULAR | Status: AC
  Filled 2021-12-08: qty 2

## 2021-12-08 MED ORDER — HEPARIN SOD (PORK) LOCK FLUSH 100 UNIT/ML IV SOLN
INTRAVENOUS | Status: AC
  Administered 2021-12-08: 5 [IU]
  Filled 2021-12-08: qty 5

## 2021-12-08 MED ORDER — MIDAZOLAM HCL 2 MG/2ML IJ SOLN
INTRAMUSCULAR | Status: AC
  Filled 2021-12-08: qty 2

## 2021-12-08 MED ORDER — MIDAZOLAM HCL 2 MG/2ML IJ SOLN
INTRAMUSCULAR | Status: AC | PRN
  Administered 2021-12-08 (×3): 1 mg via INTRAVENOUS

## 2021-12-08 NOTE — Discharge Instructions (Signed)
Moderate Conscious Sedation, Adult, Care After This sheet gives you information about how to care for yourself after your procedure. Your health care provider may also give you more specific instructions. If you have problems or questions, contact your health care provider. What can I expect after the procedure? After the procedure, it is common to have: Sleepiness for several hours. Impaired judgment for several hours. Difficulty with balance. Vomiting if you eat too soon. Follow these instructions at home: For the time period you were told by your health care provider:     Rest. Do not participate in activities where you could fall or become injured. Do not drive or use machinery. Do not drink alcohol. Do not take sleeping pills or medicines that cause drowsiness. Do not make important decisions or sign legal documents. Do not take care of children on your own. Eating and drinking  Follow the diet recommended by your health care provider. Drink enough fluid to keep your urine pale yellow. If you vomit: Drink water, juice, or soup when you can drink without vomiting. Make sure you have little or no nausea before eating solid foods. General instructions Take over-the-counter and prescription medicines only as told by your health care provider. Have a responsible adult stay with you for the time you are told. It is important to have someone help care for you until you are awake and alert. Do not smoke. Keep all follow-up visits as told by your health care provider. This is important. Contact a health care provider if: You are still sleepy or having trouble with balance after 24 hours. You feel light-headed. You keep feeling nauseous or you keep vomiting. You develop a rash. You have a fever. You have redness or swelling around the IV site. Get help right away if: You have trouble breathing. You have new-onset confusion at home. Summary After the procedure, it is common to  feel sleepy, have impaired judgment, or feel nauseous if you eat too soon. Rest after you get home. Know the things you should not do after the procedure. Follow the diet recommended by your health care provider and drink enough fluid to keep your urine pale yellow. Get help right away if you have trouble breathing or new-onset confusion at home. This information is not intended to replace advice given to you by your health care provider. Make sure you discuss any questions you have with your health care provider. Document Revised: 04/27/2019 Document Reviewed: 11/23/2018 Elsevier Patient Education  [place] Insertion, Care After The following information offers guidance on how to care for yourself after your procedure. Your health care provider may also give you more specific instructions. If you have problems or questions, contact your health care provider.  Urgent needs- Interventional Radiology clinic- [phone] Wound- May remove dressing in 24-48 hours and shower. Otherwise keep site clean and dry. May replace dressing with clean bandaids as needed.  Your Provider should set up monthly appointments for Port flush.  What can I expect after the procedure? After the procedure, it is common to have: Discomfort at the port insertion site. Bruising on the skin over the port. This should improve over 3-4 days. Follow these instructions at home: [facility] care After your port is placed, you will get a manufacturer's information card. The card has information about your port. Keep this card with you at all times. Take care of the port as told by your health care provider. Ask your health  care provider if you or a family member can get training for taking care of the port at home. A home health care nurse will be be available to help care for the port. Make sure to remember what type of port you have. Incision care     Follow instructions from your health care  provider about how to take care of your port insertion site. Make sure you: Wash your hands with soap and water for at least 20 seconds before and after you change your bandage (dressing). If soap and water are not available, use hand sanitizer. Change your dressing as told by your health care provider. Leave stitches (sutures), skin glue, or adhesive strips in place. These skin closures may need to stay in place for 2 weeks or longer. If adhesive strip edges start to loosen and curl up, you may trim the loose edges. Do not remove adhesive strips completely unless your health care provider tells you to do that. Check your port insertion site every day for signs of infection. Check for: Redness, swelling, or pain. Fluid or blood. Warmth. Pus or a bad smell. Activity Return to your normal activities as told by your health care provider. Ask your health care provider what activities are safe for you. You may have to avoid lifting. Ask your health care provider how much you can safely lift. General instructions Take over-the-counter and prescription medicines only as told by your health care provider. Do not take baths, swim, or use a hot tub until site healed. Ask your health care provider if you may take showers. You may only be allowed to take sponge baths. If you were given a sedative during the procedure, it can affect you for several hours. Do not drive or operate machinery until your health care provider says that it is safe. Wear a medical alert bracelet in case of an emergency. This will tell any health care providers that you have a port. Keep all follow-up visits. This is important. Contact a health care provider if: You cannot flush your port with saline as directed, or you cannot draw blood from the port. You have a fever or chills. You have redness, swelling, or pain around your port insertion site. You have fluid or blood coming from your port insertion site. Your port insertion  site feels warm to the touch. You have pus or a bad smell coming from the port insertion site. Get help right away if: You have chest pain or shortness of breath. You have bleeding from your port that you cannot control. These symptoms may be an emergency. Get help right away. Call 911. Do not wait to see if the symptoms will go away. Do not drive yourself to the hospital. Summary Take care of the port as told by your health care provider. Keep the manufacturer's information card with you at all times. Change your dressing as told by your health care provider. Contact a health care provider if you have a fever or chills or if you have redness, swelling, or pain around your port insertion site. Keep all follow-up visits. This information is not intended to replace advice given to you by your health care provider. Make sure you discuss any questions you have with your health care provider. Document Revised: 07/01/2020 Document Reviewed: 07/01/2020 Elsevier Patient Education  [place].

## 2021-12-08 NOTE — Procedures (Signed)
Vascular and Interventional Radiology Procedure Note  Patient: Cassidy [name] DOB: 1965/01/26 Medical Record Number: [id number] Note Date/Time: 12/08/21 1:20 PM   Performing Physician: Michaelle Birks, MD Assistant(s): None  Diagnosis: Lung cancer  Procedure: PORT PLACEMENT  Anesthesia: Conscious Sedation Complications: None Estimated Blood Loss: Minimal  Findings:  Successful left-sided port placement, with the tip of the catheter in the proximal right atrium.  Plan: Catheter ready for use.  See detailed procedure note with images in PACS. The patient tolerated the procedure well without incident or complication and was returned to Recovery in stable condition.    Michaelle Birks, MD [facility] Radiology   Pager. [place]

## 2021-12-09 ENCOUNTER — Other Ambulatory Visit: Payer: Medicaid Other

## 2021-12-09 ENCOUNTER — Other Ambulatory Visit: Payer: Self-pay | Admitting: Nurse Practitioner

## 2021-12-09 ENCOUNTER — Telehealth: Payer: Self-pay

## 2021-12-09 DIAGNOSIS — G893 Neoplasm related pain (acute) (chronic): Secondary | ICD-10-CM

## 2021-12-09 DIAGNOSIS — C3491 Malignant neoplasm of unspecified part of right bronchus or lung: Secondary | ICD-10-CM

## 2021-12-09 DIAGNOSIS — Z515 Encounter for palliative care: Secondary | ICD-10-CM

## 2021-12-09 NOTE — Telephone Encounter (Signed)
Pt called for med refill, see new orders, no further needs at this time.

## 2021-12-14 NOTE — Progress Notes (Signed)
Lysle Morales, counseling intern, met with patient for their scheduled counseling session.   The patient shared stories of her cousin's funeral. The patient reported she saw family and friends she hadn't seen in years which aided in her healing. The patient shared her son recently experienced the loss of his niece to gun violence.   The patient shared the death of her cousin has left her contemplating her own mortality. The patient reported, up to this point she had not let herself think of her own death. The patient cried and shared the release of tears made her feel better.   The patient reported she recently heard of someone speaking about random encounters and how after such encounters you never know if you have left the other person with a jewel. The patient reported feeling moved and imagining the jewels she has left with others and the jewels people have left with her.   The patient scheduled their next appointment for Monday, December 11th at [facility],  Counseling Intern  [phone] [email or web address]

## 2021-12-16 ENCOUNTER — Inpatient Hospital Stay: Payer: Medicaid Other | Attending: Internal Medicine

## 2021-12-16 ENCOUNTER — Other Ambulatory Visit: Payer: Self-pay

## 2021-12-16 ENCOUNTER — Inpatient Hospital Stay (HOSPITAL_BASED_OUTPATIENT_CLINIC_OR_DEPARTMENT_OTHER): Payer: Medicaid Other | Admitting: Internal Medicine

## 2021-12-16 ENCOUNTER — Encounter: Payer: Self-pay | Admitting: Nurse Practitioner

## 2021-12-16 ENCOUNTER — Inpatient Hospital Stay: Payer: Medicaid Other

## 2021-12-16 ENCOUNTER — Inpatient Hospital Stay (HOSPITAL_BASED_OUTPATIENT_CLINIC_OR_DEPARTMENT_OTHER): Payer: Medicaid Other | Admitting: Nurse Practitioner

## 2021-12-16 VITALS — BP 143/56 | HR 70 | Temp 98.6°F | Resp 16 | Wt 208.9 lb

## 2021-12-16 DIAGNOSIS — Z880 Allergy status to penicillin: Secondary | ICD-10-CM | POA: Diagnosis not present

## 2021-12-16 DIAGNOSIS — C787 Secondary malignant neoplasm of liver and intrahepatic bile duct: Secondary | ICD-10-CM | POA: Diagnosis not present

## 2021-12-16 DIAGNOSIS — Z95828 Presence of other vascular implants and grafts: Secondary | ICD-10-CM

## 2021-12-16 DIAGNOSIS — Z86018 Personal history of other benign neoplasm: Secondary | ICD-10-CM | POA: Insufficient documentation

## 2021-12-16 DIAGNOSIS — Z5111 Encounter for antineoplastic chemotherapy: Secondary | ICD-10-CM | POA: Diagnosis present

## 2021-12-16 DIAGNOSIS — K219 Gastro-esophageal reflux disease without esophagitis: Secondary | ICD-10-CM | POA: Insufficient documentation

## 2021-12-16 DIAGNOSIS — R530 Neoplastic (malignant) related fatigue: Secondary | ICD-10-CM

## 2021-12-16 DIAGNOSIS — C3411 Malignant neoplasm of upper lobe, right bronchus or lung: Secondary | ICD-10-CM | POA: Insufficient documentation

## 2021-12-16 DIAGNOSIS — F17210 Nicotine dependence, cigarettes, uncomplicated: Secondary | ICD-10-CM | POA: Insufficient documentation

## 2021-12-16 DIAGNOSIS — I10 Essential (primary) hypertension: Secondary | ICD-10-CM | POA: Insufficient documentation

## 2021-12-16 DIAGNOSIS — K5903 Drug induced constipation: Secondary | ICD-10-CM | POA: Diagnosis not present

## 2021-12-16 DIAGNOSIS — R42 Dizziness and giddiness: Secondary | ICD-10-CM | POA: Diagnosis not present

## 2021-12-16 DIAGNOSIS — C7972 Secondary malignant neoplasm of left adrenal gland: Secondary | ICD-10-CM | POA: Diagnosis not present

## 2021-12-16 DIAGNOSIS — K5900 Constipation, unspecified: Secondary | ICD-10-CM | POA: Insufficient documentation

## 2021-12-16 DIAGNOSIS — R1310 Dysphagia, unspecified: Secondary | ICD-10-CM | POA: Diagnosis not present

## 2021-12-16 DIAGNOSIS — F419 Anxiety disorder, unspecified: Secondary | ICD-10-CM | POA: Diagnosis not present

## 2021-12-16 DIAGNOSIS — C3491 Malignant neoplasm of unspecified part of right bronchus or lung: Secondary | ICD-10-CM

## 2021-12-16 DIAGNOSIS — Z515 Encounter for palliative care: Secondary | ICD-10-CM | POA: Diagnosis not present

## 2021-12-16 DIAGNOSIS — Z8774 Personal history of (corrected) congenital malformations of heart and circulatory system: Secondary | ICD-10-CM | POA: Diagnosis not present

## 2021-12-16 DIAGNOSIS — C7951 Secondary malignant neoplasm of bone: Secondary | ICD-10-CM | POA: Insufficient documentation

## 2021-12-16 DIAGNOSIS — Z5112 Encounter for antineoplastic immunotherapy: Secondary | ICD-10-CM | POA: Insufficient documentation

## 2021-12-16 DIAGNOSIS — Z7951 Long term (current) use of inhaled steroids: Secondary | ICD-10-CM | POA: Insufficient documentation

## 2021-12-16 DIAGNOSIS — I700 Atherosclerosis of aorta: Secondary | ICD-10-CM | POA: Diagnosis not present

## 2021-12-16 DIAGNOSIS — Z79899 Other long term (current) drug therapy: Secondary | ICD-10-CM | POA: Diagnosis not present

## 2021-12-16 DIAGNOSIS — I2510 Atherosclerotic heart disease of native coronary artery without angina pectoris: Secondary | ICD-10-CM | POA: Diagnosis not present

## 2021-12-16 DIAGNOSIS — Z923 Personal history of irradiation: Secondary | ICD-10-CM | POA: Diagnosis not present

## 2021-12-16 DIAGNOSIS — G893 Neoplasm related pain (acute) (chronic): Secondary | ICD-10-CM | POA: Diagnosis not present

## 2021-12-16 DIAGNOSIS — C7971 Secondary malignant neoplasm of right adrenal gland: Secondary | ICD-10-CM | POA: Insufficient documentation

## 2021-12-16 DIAGNOSIS — E119 Type 2 diabetes mellitus without complications: Secondary | ICD-10-CM | POA: Diagnosis not present

## 2021-12-16 LAB — CBC WITH DIFFERENTIAL (CANCER CENTER ONLY)
Abs Immature Granulocytes: 0.02 10*3/uL (ref 0.00–0.07)
Basophils Absolute: 0 10*3/uL (ref 0.0–0.1)
Basophils Relative: 0 %
Eosinophils Absolute: 0.1 10*3/uL (ref 0.0–0.5)
Eosinophils Relative: 1 %
HCT: 28.4 % — ABNORMAL LOW (ref 36.0–46.0)
Hemoglobin: 9.5 g/dL — ABNORMAL LOW (ref 12.0–15.0)
Immature Granulocytes: 0 %
Lymphocytes Relative: 13 %
Lymphs Abs: 0.6 10*3/uL — ABNORMAL LOW (ref 0.7–4.0)
MCH: 32.4 pg (ref 26.0–34.0)
MCHC: 33.5 g/dL (ref 30.0–36.0)
MCV: 96.9 fL (ref 80.0–100.0)
Monocytes Absolute: 0.6 10*3/uL (ref 0.1–1.0)
Monocytes Relative: 12 %
Neutro Abs: 3.5 10*3/uL (ref 1.7–7.7)
Neutrophils Relative %: 74 %
Platelet Count: 276 10*3/uL (ref 150–400)
RBC: 2.93 MIL/uL — ABNORMAL LOW (ref 3.87–5.11)
RDW: 18.8 % — ABNORMAL HIGH (ref 11.5–15.5)
WBC Count: 4.8 10*3/uL (ref 4.0–10.5)
nRBC: 0 % (ref 0.0–0.2)

## 2021-12-16 LAB — CMP (CANCER CENTER ONLY)
ALT: 11 U/L (ref 0–44)
AST: 13 U/L — ABNORMAL LOW (ref 15–41)
Albumin: 4.1 g/dL (ref 3.5–5.0)
Alkaline Phosphatase: 77 U/L (ref 38–126)
Anion gap: 5 (ref 5–15)
BUN: 10 mg/dL (ref 6–20)
CO2: 27 mmol/L (ref 22–32)
Calcium: 9.3 mg/dL (ref 8.9–10.3)
Chloride: 106 mmol/L (ref 98–111)
Creatinine: 0.79 mg/dL (ref 0.44–1.00)
GFR, Estimated: >60 mL/min (ref >60)
Glucose, Bld: 175 mg/dL — ABNORMAL HIGH (ref 70–99)
Potassium: 4.1 mmol/L (ref 3.5–5.1)
Sodium: 138 mmol/L (ref 135–145)
Total Bilirubin: 0.7 mg/dL (ref 0.3–1.2)
Total Protein: 6.9 g/dL (ref 6.5–8.1)

## 2021-12-16 LAB — TSH: TSH: 1.76 u[IU]/mL (ref 0.350–4.500)

## 2021-12-16 MED ORDER — SODIUM CHLORIDE 0.9% FLUSH
10.0000 mL | INTRAVENOUS | Status: DC | PRN
  Administered 2021-12-16: 10 mL

## 2021-12-16 MED ORDER — SODIUM CHLORIDE 0.9% FLUSH
10.0000 mL | INTRAVENOUS | Status: AC | PRN
  Administered 2021-12-16: 10 mL

## 2021-12-16 MED ORDER — SODIUM CHLORIDE 0.9 % IV SOLN
200.0000 mg | Freq: Once | INTRAVENOUS | Status: AC
  Administered 2021-12-16: 200 mg via INTRAVENOUS
  Filled 2021-12-16: qty 200

## 2021-12-16 MED ORDER — PROCHLORPERAZINE MALEATE 10 MG PO TABS
10.0000 mg | ORAL_TABLET | Freq: Once | ORAL | Status: AC
  Administered 2021-12-16: 10 mg via ORAL
  Filled 2021-12-16: qty 1

## 2021-12-16 MED ORDER — SODIUM CHLORIDE 0.9 % IV SOLN
500.0000 mg/m2 | Freq: Once | INTRAVENOUS | Status: AC
  Administered 2021-12-16: 1100 mg via INTRAVENOUS
  Filled 2021-12-16: qty 40

## 2021-12-16 MED ORDER — HEPARIN SOD (PORK) LOCK FLUSH 100 UNIT/ML IV SOLN
500.0000 [IU] | Freq: Once | INTRAVENOUS | Status: AC | PRN
  Administered 2021-12-16: 500 [IU]

## 2021-12-16 MED ORDER — SODIUM CHLORIDE 0.9 % IV SOLN: Freq: Once | INTRAVENOUS | Status: AC

## 2021-12-16 MED ORDER — CYANOCOBALAMIN 1000 MCG/ML IJ SOLN
1000.0000 ug | Freq: Once | INTRAMUSCULAR | Status: AC
  Administered 2021-12-16: 1000 ug via INTRAMUSCULAR
  Filled 2021-12-16: qty 1

## 2021-12-16 NOTE — Patient Instructions (Signed)
Thank you for choosing [facility] to provide your care.   Should you have questions after your visit to the [facility]), please contact this office at [phone] between 8:30 AM and 4:30 PM.  Voice mails left after 4:00 PM may not be returned until the following business day.  Calls received after 4:30 PM will be answered by an off-site Nurse Triage Line.    Prescription Refills:  Please have your pharmacy contact us directly for most prescription requests.  Contact the office directly for refills of narcotics (pain medications). Allow 48-72 hours for refills.  Appointments: Please contact the [facility] scheduling department [phone] for questions regarding [facility] appointment scheduling.  Contact the schedulers with any scheduling changes so that your appointment can be rescheduled in a timely manner.   Central Scheduling for [facility] [phone] - Call to schedule PET scan, CT scan, MRI, and Ultrasound.  To afford each patient quality time with our providers, please arrive 30 minutes before your scheduled appointment time.  If you arrive late for your appointment, you may be asked to reschedule.  We strive to give you quality time with our providers, and arriving late affects you and other patients whose appointments are after yours. If you are a no show for multiple scheduled visits, you may be dismissed from the clinic at the providers discretion.     Resources: [place] Workers [phone] for additional information on assistance programs or assistance connecting with community support programs   [place]  [phone]: Information regarding food stamps, Medicaid, and utility assistance [employer] [employer]'s shared-ride transportation service for eligible riders who have a disability that prevents them from riding the fixed route bus.   [place] [phone] Helps people with  Medicare understand their rights and benefits, navigate the Medicare system, and secure the quality healthcare they deserve [employer] [phone] Assists patients locate various types of support and financial assistance Cancer Care: 1-800-813-HOPE 3105400226) Provides financial assistance, online support groups, medication/co-pay assistance.   Transportation Assistance for appointments at [place] or provider support staff for referral to [place]   Again, thank you for choosing [facility] for your care.                Steps to Quit Smoking Smoking tobacco is the leading cause of preventable death. It can affect almost every organ in the body. Smoking puts you and people around you at risk for many serious, long-lasting (chronic) diseases. Quitting smoking can be hard, but it is one of the best things that you can do for your health. It is never too late to quit. Do not give up if you cannot quit the first time. Some people need to try many times to quit. Do your best to stick to your quit plan, and talk with your doctor if you have any questions or concerns. How do I get ready to quit? Pick a date to quit. Set a date within the next 2 weeks to give you time to prepare. Write down the reasons why you are quitting. Keep this list in places where you will see it often. Tell your family, friends, and co-workers that you are quitting. Their support is important. Talk with your doctor about the choices that may help you quit. Find out if your health insurance will pay for these treatments. Know the people, places, things, and activities that make you want to  smoke (triggers). Avoid them. What first steps can I take to quit smoking? Throw away all cigarettes at home, at work, and in your car. Throw away the things that you use when you smoke, such as ashtrays and lighters. Clean your car. Empty the ashtray. Clean your home,  including curtains and carpets. What can I do to help me quit smoking? Talk with your doctor about taking medicines and seeing a counselor. You are more likely to succeed when you do both. If you are pregnant or breastfeeding: Talk with your doctor about counseling or other ways to quit smoking. Do not take medicine to help you quit smoking unless your doctor tells you to. Quit right away Quit smoking completely, instead of slowly cutting back on how much you smoke over a period of time. Stopping smoking right away may be more successful than slowly quitting. Go to counseling. In-person is best if this is an option. You are more likely to quit if you go to counseling sessions regularly. Take medicine You may take medicines to help you quit. Some medicines need a prescription, and some you can buy over-the-counter. Some medicines may contain a drug called nicotine to replace the nicotine in cigarettes. Medicines may: Help you stop having the desire to smoke (cravings). Help to stop the problems that come when you stop smoking (withdrawal symptoms). Your doctor may ask you to use: Nicotine patches, gum, or lozenges. Nicotine inhalers or sprays. Non-nicotine medicine that you take by mouth. Find resources Find resources and other ways to help you quit smoking and remain smoke-free after you quit. They include: Online chats with a Social worker. Phone quitlines. Printed Furniture conservator/restorer. Support groups or group counseling. Text messaging programs. Mobile phone apps. Use apps on your mobile phone or tablet that can help you stick to your quit plan. Examples of free services include Quit Guide from the CDC and smokefree.gov  What can I do to make it easier to quit?  Talk to your family and friends. Ask them to support and encourage you. Call a phone quitline, such as 1-800-QUIT-NOW, reach out to support groups, or work with a Social worker. Ask people who smoke to not smoke around you. Avoid  places that make you want to smoke, such as: Bars. Parties. Smoke-break areas at work. Spend time with people who do not smoke. Lower the stress in your life. Stress can make you want to smoke. Try these things to lower stress: Getting regular exercise. Doing deep-breathing exercises. Doing yoga. Meditating. What benefits will I see if I quit smoking? Over time, you may have: A better sense of smell and taste. Less coughing and sore throat. A slower heart rate. Lower blood pressure. Clearer skin. Better breathing. Fewer sick days. Summary Quitting smoking can be hard, but it is one of the best things that you can do for your health. Do not give up if you cannot quit the first time. Some people need to try many times to quit. When you decide to quit smoking, make a plan to help you succeed. Quit smoking right away, not slowly over a period of time. When you start quitting, get help and support to keep you smoke-free. This information is not intended to replace advice given to you by your health care provider. Make sure you discuss any questions you have with your health care provider. Document Revised: 12/19/2020 Document Reviewed: 12/19/2020 Elsevier Patient Education  [place].

## 2021-12-16 NOTE — Progress Notes (Signed)
[place]  [phone]) [phone] [phone]) [phone]   Name: Cassidy [name] Date: 12/16/2021 MRN: [id number]  DOB: 27-Dec-1965  Patient Care Team: [name], DO as PCP - General (Family Medicine) O'Neal, Cassie Freer, MD as PCP - Cardiology (Cardiology) Rolm Bookbinder, MD as Consulting Physician (General Surgery)    INTERVAL HISTORY: Cassidy [name] is a 56 y.o. 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  Palliative ask to see for symptom management and goals of care.   SOCIAL HISTORY:     reports that she has been smoking cigarettes. She started smoking about 42 years ago. She has a 21.00 pack-year smoking history. She has never used smokeless tobacco. She reports that she does not currently use drugs. She reports that she does not drink alcohol.  ADVANCE DIRECTIVES:    CODE STATUS:   PAST MEDICAL HISTORY: Past Medical History:  Diagnosis Date   Adenocarcinoma of right lung, stage 4 ([place]) 08/26/2021   Anemia    has sickle cell trait   Atherosclerotic heart disease of native coronary artery with angina pectoris ([place]) 05/2012, 10/2013   a.  s/p PCTA to dRCA and ostial RPAV-PLA vessel + PDA branch (05/2012)  b. [place] s/p DES to mLAD - Resolute DES 3.0 x 22 (3.54mm -->3.3 mm)   Bipolar depression ([place])    Breast abscess    a. right side.    COPD (chronic obstructive pulmonary disease) (HCC)    Depression    Diabetic peripheral neuropathy (HCC)    Fibromyalgia    Genital warts    GERD (gastroesophageal reflux disease)    History of hiatal hernia    HLD (hyperlipidemia)    Hypertension    Pain in limb    a. LE VENOUS DUPLEX, 02/05/2009 - no evidence of deep vein thrombosis, Baker's cyst   Pneumonia    PTSD (post-traumatic stress disorder)    Sickle cell  trait (HCC)    Tobacco abuse    Tooth caries    Type II diabetes mellitus (HCC)     ALLERGIES:  is allergic to amoxil [amoxicillin], chantix [varenicline], suboxone [buprenorphine hcl-naloxone hcl], and emend [fosaprepitant dimeglumine].  MEDICATIONS:  Current Outpatient Medications  Medication Sig Dispense Refill   aspirin EC 81 MG tablet Take 81 mg by mouth daily.     atorvastatin (LIPITOR) 40 MG tablet TAKE 1 TABLET (40 MG TOTAL) BY MOUTH DAILY. 90 tablet 3   busPIRone (BUSPAR) 15 MG tablet TAKE 1/2 TABLET (7.5 MG TOTAL) BY MOUTH TWICE DAILY (Patient taking differently: Take 7.5 mg by mouth 2 (two) times daily.) 30 tablet 11   Carboxymethylcellul-Glycerin (LUBRICATING EYE DROPS OP) Place 1 drop into both eyes daily as needed (dry eyes).     carvedilol (COREG) 12.5 MG tablet TAKE 1 TABLET (12.5 MG TOTAL) BY MOUTH 2 (TWO) TIMES DAILY WITH A MEAL. 180 tablet 1   diclofenac Sodium (VOLTAREN) 1 % GEL Apply 1 application topically 3 times daily as needed for pain 2 g 3   ferrous sulfate 325 (65 FE) MG tablet Take 325 mg by mouth daily with breakfast.     fluticasone (FLONASE) 50 MCG/ACT nasal spray PLACE 1 SPRAY INTO BOTH NOSTRILS DAILY AS NEEDED (CONGESTION). 16 g 1   fluticasone-salmeterol (ADVAIR) 100-50 MCG/ACT AEPB Inhale 1 puff into the lungs 2 (two) times daily. 1 each 3   folic  acid (FOLVITE) 1 MG tablet TAKE 1 TABLET (1 MG TOTAL) BY MOUTH DAILY. 30 tablet 3   furosemide (LASIX) 40 MG tablet Take 1 tablet (40 mg total) by mouth daily as needed. If you gain 3 lbs in 24 hours or 5 lbs in 1 week 30 tablet 1   glucose blood (ACCU-CHEK AVIVA PLUS) test strip Use as instructed 50 each 1   insulin detemir (LEVEMIR FLEXPEN) 100 UNIT/ML FlexPen Inject 15 Units into the skin 2 (two) times daily. 15 mL 1   Insulin Pen Needle 32G X 4 MM MISC 1 Needle by Does not apply route in the morning and at bedtime. 120 each 2   ipratropium (ATROVENT HFA) 17 MCG/ACT inhaler Inhale 2 puffs into the lungs every  4 (four) hours as needed for wheezing (COPD).     lidocaine-prilocaine (EMLA) cream Apply 1 Application topically as needed. 30 g 2   LINZESS 290 MCG CAPS capsule TAKE 1 CAPSULE (290 MCG TOTAL) BY MOUTH DAILY BEFORE BREAKFAST. 30 capsule 3   losartan (COZAAR) 50 MG tablet Take 50 mg by mouth daily.     nicotine polacrilex (NICORETTE) 2 MG gum Take 1 each (2 mg total) by mouth as needed for smoking cessation. 100 tablet 0   nitroGLYCERIN (NITROSTAT) 0.4 MG SL tablet Place 1 tablet (0.4 mg total) under the tongue every 5 (five) minutes as needed for chest pain. 25 tablet 5   nystatin (MYCOSTATIN) 100000 UNIT/ML suspension Take 5 mLs (500,000 Units total) by mouth 4 (four) times daily. 60 mL 0   ondansetron (ZOFRAN) 8 MG tablet Take 1 tablet (8 mg total) by mouth every 8 (eight) hours as needed for nausea or vomiting. 30 tablet 1   oxyCODONE (OXY IR/ROXICODONE) 5 MG immediate release tablet TAKE 1 TABLET (5 MG TOTAL) BY MOUTH EVERY 6 (SIX) HOURS AS NEEDED FOR SEVERE PAIN. 60 tablet 0   oxyCODONE ER (XTAMPZA ER) 9 MG C12A TAKE 9 MG BY MOUTH EVERY 12 (TWELVE) HOURS. 60 capsule 0   pantoprazole (PROTONIX) 40 MG tablet TAKE 1 TABLET (40 MG TOTAL) BY MOUTH DAILY. 30 tablet 1   polyethylene glycol powder (GLYCOLAX/MIRALAX) 17 GM/SCOOP powder Take 17 g by mouth daily as needed for mild constipation or moderate constipation. 3350 g 1   potassium chloride SA (KLOR-CON M) 20 MEQ tablet Take 1 tablet (20 mEq total) by mouth daily as needed. On days you take Lasix (Patient not taking: Reported on 11/25/2021) 30 tablet 1   prochlorperazine (COMPAZINE) 10 MG tablet Take 1 tablet (10 mg total) by mouth every 6 (six) hours as needed for nausea or vomiting. 30 tablet 0   sertraline (ZOLOFT) 50 MG tablet TAKE 1 TABLET (50 MG TOTAL) BY MOUTH DAILY. 30 tablet 3   valACYclovir (VALTREX) 1000 MG tablet TAKE FOR 3 DAYS AS NEEDED FOR EACH OUTBREAK 30 tablet 11   vitamin B-12 (CYANOCOBALAMIN) 1000 MCG tablet Take 1,000 mcg by  mouth daily.     No current facility-administered medications for this visit.    VITAL SIGNS: LMP 06/12/2016 (Approximate)  There were no vitals filed for this visit.  Estimated body mass index is 32.72 kg/m as calculated from the following:   Height as of 12/08/21: 5\' 7"  (1.702 m).   Weight as of an earlier encounter on 12/16/21: 208 lb 14.4 oz (94.8 kg).   PERFORMANCE STATUS (ECOG) : 1 - Symptomatic but completely ambulatory   Physical Exam General: NAD Pulmonary: normal breathing pattern  Extremities: no edema, no  joint deformities Skin: no rashes Neurological: AAO x3, mood appropriate   IMPRESSION:  I saw Cassidy [name] during her infusion. Was quite emotional earlier today at the start of her visits. Shares anxiousness of having port access and incorrect documentation of placement. She is feeling much better now. She shares some grief that she is working through with her the loss of family members. Her niece was recently killed in a domestic violence situation and she also loss a close family member several days prior to Thanksgiving. Emotional support provided.   Neoplasm related pain Cassidy [name]'s pain has improved with regimen however some days are better than others. She is trying to remain as active as possible. Patient shares some days her pain levels are higher and despite use of medications she is still in discomfort.   We discussed at length her current pain and regimen.  She is tolerating Xtampza 9 mg every 12 hours and oxycodone 5 mg every 6 hours as needed for breakthrough pain. Over the past week she has experienced occasional episodes of increased pain. We discussed the use of her breakthrough medication and the ability to take 1-2 tablets as needed based on her level of pain. She verbalized understanding. We will continue to closely monitor and adjust as needed.   2. Constipation Tira is unable to tolerate Linzess. Reports severe abdominal cramping, sweats, and feelings  of light headedness. She is taking miralax daily. Continues to have some concerns with constipation. Recommended she increase Miralax to twice daily in addition to senna daily.   I discussed the importance of continued conversation with family and their medical providers regarding overall plan of care and treatment options, ensuring decisions are within the context of the patients values and GOCs.  PLAN: Continue Xtampza 9 mg every 12 hours Oxy IR 5-10 mg every 6 hours as needed for breakthrough pain  MiraLAX for bowel regimen. Unable to tolerate Linzess. I will plan to see patient back in 3-4 weeks in collaboration with her oncology appointments.   Patient expressed understanding and was in agreement with this plan. She also understands that She can call the clinic at any time with any questions, concerns, or complaints.     Any controlled substances utilized were prescribed in the context of palliative care. PDMP has been reviewed.   Time Total: 45 min   Visit consisted of counseling and education dealing with the complex and emotionally intense issues of symptom management and palliative care in the setting of serious and potentially life-threatening illness.Greater than 50%  of this time was spent counseling and coordinating care related to the above assessment and plan.  Alda Lea, AGPCNP-BC  Palliative Medicine Team/[facility]

## 2021-12-16 NOTE — Progress Notes (Signed)
[name] [phone]   [phone]  OFFICE PROGRESS NOTE  [name], DO [place] [address]  DIAGNOSIS: stage IV (T2a, N2, M1c) non-small cell lung cancer, adenocarcinoma presented with right upper lobe lung mass in addition to right hilar and mediastinal lymphadenopathy and innumerable bilateral pulmonary nodules as well as liver and bone metastasis diagnosed in August 2023.   Detected Alteration(s) / Biomarker(s) Associated FDA-approved therapies Clinical Trial Availability % cfDNA or Amplification  KRAS G12C  approved by FDA Adagrasib, Sotorasib Yes 5.3%  IDH1 R132L  approved in other indication Ivosidenib, Olutasidenib Yes 1.9%  PD-L1 expression 89%  PRIOR THERAPY: None  CURRENT THERAPY: Systemic chemotherapy with carboplatin for AUC of 5, Alimta 500 Mg/M2 and Keytruda 200 Mg IV every 3 weeks.  First dose September 02, 2021.  Status post 5 cycles.   INTERVAL HISTORY: Cassidy [name] 56 y.o. female returns to the clinic for a for follow-up visit.  The patient is a little bit tearful and emotional this morning.  She lost a cousin before Thanksgiving.  She also had a Port-A-Cath placed and she still have some mild pain in that area.  She denied having any current chest pain, shortness of breath, cough or hemoptysis.  She denied having any fever or chills.  She has no nausea, vomiting, diarrhea or constipation.  She has no headache or visual changes but has occasional dizzy spells.  She is here today for evaluation before starting cycle #6.   MEDICAL HISTORY: Past Medical History:  Diagnosis Date   Adenocarcinoma of right lung, stage 4 ([facility]) 08/26/2021   Anemia    has sickle cell trait   Atherosclerotic heart disease of native coronary artery with angina pectoris ([facility]) 05/2012, 10/2013   a.  s/p PCTA to dRCA and ostial RPAV-PLA vessel + PDA branch (05/2012)  b. [place] s/p DES to mLAD - Resolute DES 3.0 x 22 (3.19m -->3.3  mm)   Bipolar depression ([facility]    Breast abscess    a. right side.    COPD (chronic obstructive pulmonary disease) (HCC)    Depression    Diabetic peripheral neuropathy (HCC)    Fibromyalgia    Genital warts    GERD (gastroesophageal reflux disease)    History of hiatal hernia    HLD (hyperlipidemia)    Hypertension    Pain in limb    a. LE VENOUS DUPLEX, 02/05/2009 - no evidence of deep vein thrombosis, Baker's cyst   Pneumonia    PTSD (post-traumatic stress disorder)    Sickle cell trait (HCC)    Tobacco abuse    Tooth caries    Type II diabetes mellitus (HCC)     ALLERGIES:  is allergic to amoxil [amoxicillin], chantix [varenicline], suboxone [buprenorphine hcl-naloxone hcl], and emend [fosaprepitant dimeglumine].  MEDICATIONS:  Current Outpatient Medications  Medication Sig Dispense Refill   aspirin EC 81 MG tablet Take 81 mg by mouth daily.     atorvastatin (LIPITOR) 40 MG tablet TAKE 1 TABLET (40 MG TOTAL) BY MOUTH DAILY. 90 tablet 3   busPIRone (BUSPAR) 15 MG tablet TAKE 1/2 TABLET (7.5 MG TOTAL) BY MOUTH TWICE DAILY (Patient taking differently: Take 7.5 mg by mouth 2 (two) times daily.) 30 tablet 11   Carboxymethylcellul-Glycerin (LUBRICATING EYE DROPS OP) Place 1 drop into both eyes daily as needed (dry eyes).     carvedilol (COREG) 12.5 MG tablet TAKE 1 TABLET (12.5 MG TOTAL) BY MOUTH 2 (  TWO) TIMES DAILY WITH A MEAL. 180 tablet 1   diclofenac Sodium (VOLTAREN) 1 % GEL Apply 1 application topically 3 times daily as needed for pain 2 g 3   ferrous sulfate 325 (65 FE) MG tablet Take 325 mg by mouth daily with breakfast.     fluticasone (FLONASE) 50 MCG/ACT nasal spray PLACE 1 SPRAY INTO BOTH NOSTRILS DAILY AS NEEDED (CONGESTION). 16 g 1   fluticasone-salmeterol (ADVAIR) 100-50 MCG/ACT AEPB Inhale 1 puff into the lungs 2 (two) times daily. 1 each 3   folic acid (FOLVITE) 1 MG tablet TAKE 1 TABLET (1 MG TOTAL) BY MOUTH DAILY. 30 tablet 3   furosemide (LASIX) 40 MG tablet  Take 1 tablet (40 mg total) by mouth daily as needed. If you gain 3 lbs in 24 hours or 5 lbs in 1 week 30 tablet 1   glucose blood (ACCU-CHEK AVIVA PLUS) test strip Use as instructed 50 each 1   insulin detemir (LEVEMIR FLEXPEN) 100 UNIT/ML FlexPen Inject 15 Units into the skin 2 (two) times daily. 15 mL 1   Insulin Pen Needle 32G X 4 MM MISC 1 Needle by Does not apply route in the morning and at bedtime. 120 each 2   ipratropium (ATROVENT HFA) 17 MCG/ACT inhaler Inhale 2 puffs into the lungs every 4 (four) hours as needed for wheezing (COPD).     lidocaine-prilocaine (EMLA) cream Apply 1 Application topically as needed. 30 g 2   LINZESS 290 MCG CAPS capsule TAKE 1 CAPSULE (290 MCG TOTAL) BY MOUTH DAILY BEFORE BREAKFAST. 30 capsule 3   losartan (COZAAR) 50 MG tablet Take 50 mg by mouth daily.     nicotine polacrilex (NICORETTE) 2 MG gum Take 1 each (2 mg total) by mouth as needed for smoking cessation. 100 tablet 0   nitroGLYCERIN (NITROSTAT) 0.4 MG SL tablet Place 1 tablet (0.4 mg total) under the tongue every 5 (five) minutes as needed for chest pain. 25 tablet 5   nystatin (MYCOSTATIN) 100000 UNIT/ML suspension Take 5 mLs (500,000 Units total) by mouth 4 (four) times daily. 60 mL 0   ondansetron (ZOFRAN) 8 MG tablet Take 1 tablet (8 mg total) by mouth every 8 (eight) hours as needed for nausea or vomiting. 30 tablet 1   oxyCODONE (OXY IR/ROXICODONE) 5 MG immediate release tablet TAKE 1 TABLET (5 MG TOTAL) BY MOUTH EVERY 6 (SIX) HOURS AS NEEDED FOR SEVERE PAIN. 60 tablet 0   oxyCODONE ER (XTAMPZA ER) 9 MG C12A TAKE 9 MG BY MOUTH EVERY 12 (TWELVE) HOURS. 60 capsule 0   pantoprazole (PROTONIX) 40 MG tablet TAKE 1 TABLET (40 MG TOTAL) BY MOUTH DAILY. 30 tablet 1   polyethylene glycol powder (GLYCOLAX/MIRALAX) 17 GM/SCOOP powder Take 17 g by mouth daily as needed for mild constipation or moderate constipation. 3350 g 1   potassium chloride SA (KLOR-CON M) 20 MEQ tablet Take 1 tablet (20 mEq total) by  mouth daily as needed. On days you take Lasix (Patient not taking: Reported on 11/25/2021) 30 tablet 1   prochlorperazine (COMPAZINE) 10 MG tablet Take 1 tablet (10 mg total) by mouth every 6 (six) hours as needed for nausea or vomiting. 30 tablet 0   sertraline (ZOLOFT) 50 MG tablet TAKE 1 TABLET (50 MG TOTAL) BY MOUTH DAILY. 30 tablet 3   valACYclovir (VALTREX) 1000 MG tablet TAKE FOR 3 DAYS AS NEEDED FOR EACH OUTBREAK 30 tablet 11   vitamin B-12 (CYANOCOBALAMIN) 1000 MCG tablet Take 1,000 mcg by mouth daily.  No current facility-administered medications for this visit.    SURGICAL HISTORY:  Past Surgical History:  Procedure Laterality Date   BLADDER SURGERY  1980   "TVT"   BLADDER SURGERY  2003   "TVT"   BREAST EXCISIONAL BIOPSY     BREAST SURGERY Right    I&D for multiple abscesses   CARDIAC CATHETERIZATION     CORONARY ARTERY BYPASS GRAFT N/A 06/30/2021   Procedure: CORONARY ARTERY BYPASS GRAFTING (CABG) x3 USING LEFT INTERNAL MAMMARY ARTERY,  RIGHT RADIAL ARTERY AND LEFT GREATER SAPHENOUS VEIN;  Surgeon: Lajuana Matte, MD;  Location: [facility];  Service: Open Heart Surgery;  Laterality: N/A;   cutting balloon     ENDOVEIN HARVEST OF GREATER SAPHENOUS VEIN Left 06/30/2021   Procedure: ENDOVEIN HARVEST OF GREATER SAPHENOUS VEIN;  Surgeon: Lajuana Matte, MD;  Location: [facility];  Service: Open Heart Surgery;  Laterality: Left;   EYE SURGERY Left    laser surgery   FINE NEEDLE ASPIRATION  08/21/2021   Procedure: FINE NEEDLE ASPIRATION (FNA) LINEAR;  Surgeon: Garner Nash, DO;  Location: [facility];  Service: Pulmonary;;   INCISE AND DRAIN ABCESS  ; 04/26/11; 08/13/11   right breast   INCISION AND DRAINAGE ABSCESS Right 05/09/2012   Procedure: INCISION AND DRAINAGE RIGHT BREAST ABSCESS;  Surgeon: Imogene Burn. Georgette Dover, MD;  Location: [facility];  Service: General;  Laterality: Right;   INCISION AND DRAINAGE ABSCESS Right 02/08/2014   Procedure: INCISION AND DRAINAGE RIGHT BREAST  ABSCESS;  Surgeon: Donnie Mesa, MD;  Location: [facility];  Service: General;  Laterality: Right;   INCISION AND DRAINAGE ABSCESS Right 06/14/2014   Procedure: INCISION AND DRAINAGE RIGHT BREAST ABSCESS;  Surgeon: Donnie Mesa, MD;  Location: [facility];  Service: General;  Laterality: Right;   IR IMAGING GUIDED PORT INSERTION  12/08/2021   IRRIGATION AND DEBRIDEMENT ABSCESS  12/19/2010   Procedure: IRRIGATION AND DEBRIDEMENT ABSCESS;  Surgeon: Rolm Bookbinder, MD;  Location: [facility];  Service: General;  Laterality: Right;   IRRIGATION AND DEBRIDEMENT ABSCESS  08/13/2011   Procedure: MINOR INCISION AND DRAINAGE OF ABSCESS;  Surgeon: Gwenyth Ober, MD;  Location: [facility];  Service: General;  Laterality: Right;  Right Breast    IRRIGATION AND DEBRIDEMENT ABSCESS  11/17/2011   Procedure: IRRIGATION AND DEBRIDEMENT ABSCESS;  Surgeon: Imogene Burn. Georgette Dover, MD;  Location: [facility];  Service: General;  Laterality: Right;  irrigation and debridement right recurrent breast abscess   LARYNX SURGERY     LEFT HEART CATH AND CORONARY ANGIOGRAPHY N/A 04/09/2021   Procedure: LEFT HEART CATH AND CORONARY ANGIOGRAPHY;  Surgeon: Jettie Booze, MD;  Location: [facility];  Service: Cardiovascular;  Laterality: N/A;   LEFT HEART CATHETERIZATION WITH CORONARY ANGIOGRAM N/A 05/29/2012   Procedure: LEFT HEART CATHETERIZATION WITH CORONARY ANGIOGRAM & PTCA;  Surgeon: Troy Sine, MD;  Location[facility];  Service: Cardiovascular; Bifurcation dRCA-RPAD/PLA & rPDA  PTCA.   LEFT HEART CATHETERIZATION WITH CORONARY ANGIOGRAM N/A 11/08/2013   Procedure: LEFT HEART CATHETERIZATION WITH CORONARY ANGIOGRAM and Coronary Stent Intervention;  Surgeon: Leonie Man, MD;  Location: [facility] C[facility] LAB;  Service: Cardiovascular; mLAD 80% (FFR 0.73) - resolute DES 3.0 x 22 mm (postdilated to 3.3 mm->3.5 mm)   NM MYOVIEW LTD  01/26/2009   Normal study, no evidence of ischemia, EF 67%   ployp removed from voice box 03/30/12     RADIAL  ARTERY HARVEST Right 06/30/2021   Procedure: RADIAL ARTERY HARVEST;  Surgeon:  Lajuana Matte, MD;  Location: [facility];  Service: Open Heart Surgery;  Laterality: Right;   REFRACTIVE SURGERY  ~ 2010   right   TEE WITHOUT CARDIOVERSION N/A 06/30/2021   Procedure: TRANSESOPHAGEAL ECHOCARDIOGRAM (TEE);  Surgeon: Lajuana Matte, MD;  Location: [facility];  Service: Open Heart Surgery;  Laterality: N/A;   TONSILLECTOMY  1988   VIDEO BRONCHOSCOPY WITH ENDOBRONCHIAL ULTRASOUND Bilateral 08/21/2021   Procedure: VIDEO BRONCHOSCOPY WITH ENDOBRONCHIAL ULTRASOUND;  Surgeon: Garner Nash, DO;  Location: [facility];  Service: Pulmonary;  Laterality: Bilateral;    REVIEW OF SYSTEMS:  A comprehensive review of systems was negative except for: Constitutional: positive for fatigue Neurological: positive for dizziness   PHYSICAL EXAMINATION: General appearance: alert, cooperative, fatigued, and no distress Head: Normocephalic, without obvious abnormality, atraumatic Neck: no adenopathy, no JVD, supple, symmetrical, trachea midline, and thyroid not enlarged, symmetric, no tenderness/mass/nodules Lymph nodes: Cervical, supraclavicular, and axillary nodes normal. Resp: clear to auscultation bilaterally Back: symmetric, no curvature. ROM normal. No CVA tenderness. Cardio: regular rate and rhythm, S1, S2 normal, no murmur, click, rub or gallop GI: soft, non-tender; bowel sounds normal; no masses,  no organomegaly Extremities: extremities normal, atraumatic, no cyanosis or edema  ECOG PERFORMANCE STATUS: 1 - Symptomatic but completely ambulatory  Blood pressure (!) 143/56, pulse 70, temperature 98.6 F (37 C), temperature source Oral, resp. rate 16, weight 208 lb 14.4 oz (94.8 kg), last menstrual period 06/12/2016, SpO2 100 %.  LABORATORY DATA: Lab Results  Component Value Date   WBC 3.7 (L) 11/25/2021   HGB 9.7 (L) 11/25/2021   HCT 28.3 (L) 11/25/2021   MCV 91.3 11/25/2021   PLT 207 11/25/2021       Chemistry      Component Value Date/Time   NA 137 11/25/2021 1049   NA 140 04/03/2021 1059   K 4.0 11/25/2021 1049   CL 104 11/25/2021 1049   CO2 28 11/25/2021 1049   BUN 11 11/25/2021 1049   BUN 13 04/03/2021 1059   CREATININE 0.81 11/25/2021 1049   CREATININE 0.65 11/06/2013 1520      Component Value Date/Time   CALCIUM 9.0 11/25/2021 1049   ALKPHOS 78 11/25/2021 1049   AST 11 (L) 11/25/2021 1049   ALT 9 11/25/2021 1049   BILITOT 0.7 11/25/2021 1049       RADIOGRAPHIC STUDIES: IR IMAGING GUIDED PORT INSERTION  Addendum Date: 12/16/2021   ADDENDUM REPORT: 12/16/2021 11:59 ADDENDUM: Addendum to typographical error in catheter tip position. Should read. Successful placement of a LEFT internal jugular approach power injectable Port-A-Cath. The tip of the catheter is positioned within the proximal RIGHT atrium. Electronically Signed   By: Michaelle Birks M.D.   On: 12/16/2021 11:59   Result Date: 12/16/2021 INDICATION: RIGHT lung cancer. EXAM: IMPLANTED PORT A CATH PLACEMENT WITH ULTRASOUND AND FLUOROSCOPIC GUIDANCE MEDICATIONS: None ANESTHESIA/SEDATION: Moderate (conscious) sedation was employed during this procedure. A total of Versed 3 mg and Fentanyl 150 mcg was administered intravenously. Moderate Sedation Time: 21 minutes. The patient's level of consciousness and vital signs were monitored continuously by radiology nursing throughout the procedure under my direct supervision. FLUOROSCOPY TIME:  Fluoroscopic dose; 0 mGy COMPLICATIONS: None immediate. PROCEDURE: The procedure, risks, benefits, and alternatives were explained to the patient. Questions regarding the procedure were encouraged and answered. The patient understands and consents to the procedure. The LEFT neck and chest were prepped with chlorhexidine in a sterile fashion, and a sterile drape was applied covering the operative field. Maximum barrier sterile  technique with sterile gowns and gloves were used for the  procedure. A timeout was performed prior to the initiation of the procedure. Local anesthesia was provided with 1% lidocaine with epinephrine. After creating a small venotomy incision, a micropuncture kit was utilized to access the internal jugular vein under direct, real-time ultrasound guidance. Ultrasound image documentation was performed. The microwire was kinked to measure appropriate catheter length. A subcutaneous port pocket was then created along the upper chest wall utilizing a combination of sharp and blunt dissection. The pocket was irrigated with sterile saline. A single lumen ISP power injectable port was chosen for placement. The 8 Fr catheter was tunneled from the port pocket site to the venotomy incision. The port was placed in the pocket. The external catheter was trimmed to appropriate length. At the venotomy, an 8 Fr peel-away sheath was placed over a guidewire under fluoroscopic guidance. The catheter was then placed through the sheath and the sheath was removed. Final catheter positioning was confirmed and documented with a fluoroscopic spot radiograph. The port was accessed with a Huber needle, aspirated and flushed with heparinized saline. The port pocket incision was closed with interrupted 3-0 Vicryl suture then Dermabond was applied, including at the venotomy incision. Dressings were placed. The patient tolerated the procedure well without immediate post procedural complication. IMPRESSION: Successful placement of a LEFT internal jugular approach power injectable Port-A-Cath. The tip of the catheter is positioned within the proximal LEFT atrium. The catheter is ready for immediate use. Michaelle Birks, MD [facility] Radiology Electronically Signed: By: Michaelle Birks M.D. On: 12/08/2021 17:16   CT Chest W Contrast  Result Date: 11/24/2021 CLINICAL DATA:  Non-small cell lung cancer restaging * Tracking Code: BO * EXAM: CT CHEST, ABDOMEN, AND  PELVIS WITH CONTRAST TECHNIQUE: Multidetector CT imaging of the chest, abdomen and pelvis was performed following the standard protocol during bolus administration of intravenous contrast. RADIATION DOSE REDUCTION: This exam was performed according to the departmental dose-optimization program which includes automated exposure control, adjustment of the mA and/or kV according to patient size and/or use of iterative reconstruction technique. CONTRAST:  127m OMNIPAQUE IOHEXOL 300 MG/ML  SOLN COMPARISON:  CT chest angiogram, 09/29/2021, PET-CT, 08/28/2021 CT abdomen pelvis, 05/22/2015 FINDINGS: CT CHEST FINDINGS Cardiovascular: Aortic atherosclerosis. Normal heart size. Three-vessel coronary artery calcifications status post median sternotomy and CABG. No pericardial effusion. Mediastinum/Nodes: Unchanged enlarged right hilar lymph node measuring 1.9 x 1.7 cm (series 2, image 27). Other right hilar lymph nodes are diminished in size (series 2, image 71). Diminished size of subcarinal lymph node measuring 2.0 x 1.0 cm, previously 2.8 x 1.6 cm (series 2, image 29). Otherwise matted appearance of treated soft tissue. Thyroid gland, trachea, and esophagus demonstrate no significant findings. Lungs/Pleura: Diminished size of a thick-walled cavitary nodule of the peripheral right upper lobe, measuring 2.0 x 1.3 cm, previously 2.8 x 2.0 cm (series 6, image 44). Multiple additional smaller bilateral pulmonary nodules are essentially completely resolved, for example with only a faint irregular residua of a nodule seen in the posterior left upper lobe (series 6, image 70). Background of very fine centrilobular nodules, most concentrated in the lung apices. No pleural effusion or pneumothorax. Musculoskeletal: Status post median sternotomy and CABG, with partial healing and callus formation. Subacute, partially callused fracture of the lateral left sixth rib, with new sclerosis, previously FDG avid (series 2, image 37). CT  ABDOMEN PELVIS FINDINGS Hepatobiliary: Diminished size of a hypodense, previously FDG avid lesion of the  tip of the left lobe of the liver, hepatic segment II/III measuring 0.9 x 0.8 cm, previously 1.9 x 1.6 cm (series 2, image 55) as well as a hypodense, previously FDG avid lesion of the inferior right lobe of the liver, hepatic segment VI, measuring 0.6 cm, previously 1.5 x 1.1 cm (series 2, image 78). Additional hypodense lesions of the posterior liver dome, hepatic segment VIII, are not significantly changed, these not previously FDG avid and stable in comparison to remote prior examination dated 05/22/2015, slightly photopenic and most likely benign cysts or hemangiomata (series 2, image 5153). No gallstones, gallbladder wall thickening, or biliary dilatation. Pancreas: Unremarkable. No pancreatic ductal dilatation or surrounding inflammatory changes. Spleen: Normal in size without significant abnormality. Adrenals/Urinary Tract: Stable, definitively benign right adrenal adenoma, not previously FDG avid and unchanged in comparison to remote prior examination dated 05/22/2015, no further follow-up or characterization required. Kidneys are normal, without renal calculi, solid lesion, or hydronephrosis. Diffuse urinary bladder wall thickening. Stomach/Bowel: Stomach is within normal limits. Appendix appears normal. No evidence of bowel wall thickening, distention, or inflammatory changes. Large burden of stool throughout the colon. Vascular/Lymphatic: Aortic atherosclerosis. No enlarged abdominal or pelvic lymph nodes. Reproductive: No mass or other abnormality. Other: No abdominal wall hernia or abnormality. No ascites. Musculoskeletal: No acute osseous findings. New sclerosis of multiple scattered previously FDG avid osseous metastatic lesions, including of the L4 vertebral body (series 2, image 81), the S1 segment (series 2, image 89), in the proximal right femur (series 2, image 117). IMPRESSION: 1. When  compared to most recent imaging of the chest dated 09/29/2021, diminished size of a thick-walled cavitary nodule of the peripheral right upper lobe. 2. Multiple additional smaller bilateral pulmonary nodules are essentially completely resolved. 3. Stable to slightly diminished mediastinal and right hilar lymphadenopathy. 4. When compared to most recent imaging of the abdomen dated 08/28/2021, diminished size of metastatic lesions of the liver. 5. New post treatment sclerosis of multiple scattered previously lytic and FDG avid osseous metastatic lesions. 6. Findings are consistent with treatment response of primary lung malignancy, pulmonary, nodal, hepatic, and osseous metastatic disease. 7. Background of very fine centrilobular nodules, most concentrated in the lung apices, consistent with smoking-related respiratory bronchiolitis. 8. Diffuse urinary bladder wall thickening, suggestive of nonspecific infectious or inflammatory cystitis. Correlate with urinalysis. 9. Coronary artery disease. Aortic Atherosclerosis (ICD10-I70.0). Electronically Signed   By: Delanna Ahmadi M.D.   On: 11/24/2021 16:44   CT Abdomen Pelvis W Contrast  Result Date: 11/24/2021 CLINICAL DATA:  Non-small cell lung cancer restaging * Tracking Code: BO * EXAM: CT CHEST, ABDOMEN, AND PELVIS WITH CONTRAST TECHNIQUE: Multidetector CT imaging of the chest, abdomen and pelvis was performed following the standard protocol during bolus administration of intravenous contrast. RADIATION DOSE REDUCTION: This exam was performed according to the departmental dose-optimization program which includes automated exposure control, adjustment of the mA and/or kV according to patient size and/or use of iterative reconstruction technique. CONTRAST:  166m OMNIPAQUE IOHEXOL 300 MG/ML  SOLN COMPARISON:  CT chest angiogram, 09/29/2021, PET-CT, 08/28/2021 CT abdomen pelvis, 05/22/2015 FINDINGS: CT CHEST FINDINGS Cardiovascular: Aortic atherosclerosis. Normal  heart size. Three-vessel coronary artery calcifications status post median sternotomy and CABG. No pericardial effusion. Mediastinum/Nodes: Unchanged enlarged right hilar lymph node measuring 1.9 x 1.7 cm (series 2, image 27). Other right hilar lymph nodes are diminished in size (series 2, image 71). Diminished size of subcarinal lymph node measuring 2.0 x 1.0 cm, previously 2.8 x 1.6 cm (series 2, image 29).  Otherwise matted appearance of treated soft tissue. Thyroid gland, trachea, and esophagus demonstrate no significant findings. Lungs/Pleura: Diminished size of a thick-walled cavitary nodule of the peripheral right upper lobe, measuring 2.0 x 1.3 cm, previously 2.8 x 2.0 cm (series 6, image 44). Multiple additional smaller bilateral pulmonary nodules are essentially completely resolved, for example with only a faint irregular residua of a nodule seen in the posterior left upper lobe (series 6, image 70). Background of very fine centrilobular nodules, most concentrated in the lung apices. No pleural effusion or pneumothorax. Musculoskeletal: Status post median sternotomy and CABG, with partial healing and callus formation. Subacute, partially callused fracture of the lateral left sixth rib, with new sclerosis, previously FDG avid (series 2, image 37). CT ABDOMEN PELVIS FINDINGS Hepatobiliary: Diminished size of a hypodense, previously FDG avid lesion of the tip of the left lobe of the liver, hepatic segment II/III measuring 0.9 x 0.8 cm, previously 1.9 x 1.6 cm (series 2, image 55) as well as a hypodense, previously FDG avid lesion of the inferior right lobe of the liver, hepatic segment VI, measuring 0.6 cm, previously 1.5 x 1.1 cm (series 2, image 78). Additional hypodense lesions of the posterior liver dome, hepatic segment VIII, are not significantly changed, these not previously FDG avid and stable in comparison to remote prior examination dated 05/22/2015, slightly photopenic and most likely benign cysts  or hemangiomata (series 2, image 5153). No gallstones, gallbladder wall thickening, or biliary dilatation. Pancreas: Unremarkable. No pancreatic ductal dilatation or surrounding inflammatory changes. Spleen: Normal in size without significant abnormality. Adrenals/Urinary Tract: Stable, definitively benign right adrenal adenoma, not previously FDG avid and unchanged in comparison to remote prior examination dated 05/22/2015, no further follow-up or characterization required. Kidneys are normal, without renal calculi, solid lesion, or hydronephrosis. Diffuse urinary bladder wall thickening. Stomach/Bowel: Stomach is within normal limits. Appendix appears normal. No evidence of bowel wall thickening, distention, or inflammatory changes. Large burden of stool throughout the colon. Vascular/Lymphatic: Aortic atherosclerosis. No enlarged abdominal or pelvic lymph nodes. Reproductive: No mass or other abnormality. Other: No abdominal wall hernia or abnormality. No ascites. Musculoskeletal: No acute osseous findings. New sclerosis of multiple scattered previously FDG avid osseous metastatic lesions, including of the L4 vertebral body (series 2, image 81), the S1 segment (series 2, image 89), in the proximal right femur (series 2, image 117). IMPRESSION: 1. When compared to most recent imaging of the chest dated 09/29/2021, diminished size of a thick-walled cavitary nodule of the peripheral right upper lobe. 2. Multiple additional smaller bilateral pulmonary nodules are essentially completely resolved. 3. Stable to slightly diminished mediastinal and right hilar lymphadenopathy. 4. When compared to most recent imaging of the abdomen dated 08/28/2021, diminished size of metastatic lesions of the liver. 5. New post treatment sclerosis of multiple scattered previously lytic and FDG avid osseous metastatic lesions. 6. Findings are consistent with treatment response of primary lung malignancy, pulmonary, nodal, hepatic, and  osseous metastatic disease. 7. Background of very fine centrilobular nodules, most concentrated in the lung apices, consistent with smoking-related respiratory bronchiolitis. 8. Diffuse urinary bladder wall thickening, suggestive of nonspecific infectious or inflammatory cystitis. Correlate with urinalysis. 9. Coronary artery disease. Aortic Atherosclerosis (ICD10-I70.0). Electronically Signed   By: Delanna Ahmadi M.D.   On: 11/24/2021 16:44    ASSESSMENT AND PLAN: This is a very pleasant 56 years old African-American female recently diagnosed with a stage IV (T2 a, N2, M1 C) non-small cell lung cancer, adenocarcinoma presented with right upper lobe lung mass  in addition to right hilar and mediastinal lymphadenopathy as well as innumerable bilateral pulmonary nodules and bone and liver metastasis diagnosed in August 2023 with positive KRAS G12C mutation and PD-L1 expression of 89%. She is currently undergoing systemic chemotherapy with carboplatin for AUC of 5, Alimta 500 Mg/M2 and Keytruda 200 Mg IV every 3 weeks status post 5 cycles.  The patient has been tolerating this treatment well with no concerning adverse effects except for mild fatigue. I recommended for her to proceed with cycle #6 today as planned. I will see her back for follow-up visit in 3 weeks for evaluation before starting cycle #7.  We will consider repeating her imaging studies after the next cycle of her treatment. For the back pain, she is currently on Norco every 8 hours.  She is followed by the palliative care team.   For the bone metastasis, I completed palliative radiotherapy to the painful bone lesions. She was advised to call immediately if she has any other concerning symptoms in the interval.  The patient voices understanding of current disease status and treatment options and is in agreement with the current care plan.  All questions were answered. The patient knows to call the clinic with any problems, questions or  concerns. We can certainly see the patient much sooner if necessary.  Disclaimer: This note was dictated with voice recognition software. Similar sounding words can inadvertently be transcribed and may not be corrected upon review.

## 2021-12-18 LAB — T4: T4, Total: 9.4 ug/dL (ref 4.5–12.0)

## 2021-12-21 ENCOUNTER — Telehealth: Payer: Self-pay

## 2021-12-21 NOTE — Telephone Encounter (Signed)
Cassidy [name], counseling intern, returned the patient's phone call after the patient left a message canceling their appointment today.   The patient reported they were not feeling well today. The patient reported they have no appetite and their port is sore and inflamed.   The patient reported they had an argument with their brother last weekend.   The patient shared their son was planning on coming over to visit this evening.   The counselor told the patient they would call them the first week of January to set up a counseling session.   Cassidy [name],  Counseling Intern  [phone] [email or web address]

## 2021-12-22 NOTE — Progress Notes (Unsigned)
Cardiology Office Note:   Date:  12/24/2021  NAME:  JALEEAH [name]    MRN: [id number] DOB:  1965/02/28   PCP:  [name], DO  Cardiologist:  Evalina Field, MD  Electrophysiologist:  None   Referring MD: [name], DO   Chief Complaint  Patient presents with   Follow-up   History of Present Illness:   Cassidy [name] is a 56 y.o. female with a hx of CAD s/p CABG, DM, HTN, HLD, lung CA who presents for follow-up.  Diagnosed with stage IV metastatic lung cancer.  Has vertebral mets.  She reports lots of pain.  She is still doing chemotherapy.  Recently had a port placed.  No symptoms of angina.  All of her other risk factors are well-controlled.  She has stopped smoking.  She is anemic with a hemoglobin of 9.5.  We should keep a close eye on this.  No bleeding.  She is on aspirin.  She is on a statin.  Her blood pressure has been low.  She should continue carvedilol.  If blood pressure values remain persistently low we will stop this.  Overall without major complaints in the office from her cardiac standpoint.  Problem List CAD -2014: POBA dRCA/PDA/PLV -2015: PCI to mid LAD  -06/30/2021 -> CABG x 3 (LIMA-LAD, SVG-dRCA, SVG-OM) 2. HLD -T chol 131, HDL 60, LDL 56, triglycerides 77 3. DM -A1c 6.8 4. HTN 5. Tobacco abuse  -40 pack years 6. Lung adenocarcinoma, Stage IV -vertebral mets  -Dx 08/18/2021  Past Medical History: Past Medical History:  Diagnosis Date   Adenocarcinoma of right lung, stage 4 ([place]) 08/26/2021   Anemia    has sickle cell trait   Atherosclerotic heart disease of native coronary artery with angina pectoris ([place]) 05/2012, 10/2013   a.  s/p PCTA to dRCA and ostial RPAV-PLA vessel + PDA branch (05/2012)  b. [place] s/p DES to mLAD - Resolute DES 3.0 x 22 (3.61mm -->3.3 mm)   Bipolar depression ([place])    Breast abscess    a. right side.    COPD (chronic obstructive pulmonary disease) (HCC)    Depression    Diabetic peripheral neuropathy (H[facility])     Fibromyalgia    Genital warts    GERD (gastroesophageal reflux disease)    History of hiatal hernia    HLD (hyperlipidemia)    Hypertension    Pain in limb    a. LE VENOUS DUPLEX, 02/05/2009 - no evidence of deep vein thrombosis, Baker's cyst   Pneumonia    PTSD (post-traumatic stress disorder)    Sickle cell trait ([place])    Tobacco abuse    Tooth caries    Type II diabetes mellitus ([place])     Past Surgical History: Past Surgical History:  Procedure Laterality Date   BLADDER SURGERY  1980   "TVT"   BLADDER SURGERY  2003   "TVT"   BREAST EXCISIONAL BIOPSY     BREAST SURGERY Right    I&D for multiple abscesses   CARDIAC CATHETERIZATION     CORONARY ARTERY BYPASS GRAFT N/A 06/30/2021   Procedure: CORONARY ARTERY BYPASS GRAFTING (CABG) x3 USING LEFT INTERNAL MAMMARY ARTERY,  RIGHT RADIAL ARTERY AND LEFT GREATER SAPHENOUS VEIN;  Surgeon: Lajuana Matte, MD;  Location: [facility];  Service: Open Heart Surgery;  Laterality: N/A;   cutting balloon     ENDOVEIN HARVEST OF GREATER SAPHENOUS VEIN Left 06/30/2021   Procedure: ENDOVEIN HARVEST OF GREATER SAPHENOUS VEIN;  Surgeon: Kipp Brood,  Lucile Crater, MD;  Location: [facility];  Service: Open Heart Surgery;  Laterality: Left;   EYE SURGERY Left    laser surgery   FINE NEEDLE ASPIRATION  08/21/2021   Procedure: FINE NEEDLE ASPIRATION (FNA) LINEAR;  Surgeon: Garner Nash, DO;  Location: [facility];  Service: Pulmonary;;   INCISE AND DRAIN ABCESS  ; 04/26/11; 08/13/11   right breast   INCISION AND DRAINAGE ABSCESS Right 05/09/2012   Procedure: INCISION AND DRAINAGE RIGHT BREAST ABSCESS;  Surgeon: Imogene Burn. Georgette Dover, MD;  Location: [facility];  Service: General;  Laterality: Right;   INCISION AND DRAINAGE ABSCESS Right 02/08/2014   Procedure: INCISION AND DRAINAGE RIGHT BREAST ABSCESS;  Surgeon: Donnie Mesa, MD;  Location: [facility];  Service: General;  Laterality: Right;   INCISION AND DRAINAGE ABSCESS Right 06/14/2014   Procedure: INCISION AND DRAINAGE  RIGHT BREAST ABSCESS;  Surgeon: Donnie Mesa, MD;  Location: [facility];  Service: General;  Laterality: Right;   IR IMAGING GUIDED PORT INSERTION  12/08/2021   IRRIGATION AND DEBRIDEMENT ABSCESS  12/19/2010   Procedure: IRRIGATION AND DEBRIDEMENT ABSCESS;  Surgeon: Rolm Bookbinder, MD;  Location: [facility];  Service: General;  Laterality: Right;   IRRIGATION AND DEBRIDEMENT ABSCESS  08/13/2011   Procedure: MINOR INCISION AND DRAINAGE OF ABSCESS;  Surgeon: Gwenyth Ober, MD;  Location: [facility];  Service: General;  Laterality: Right;  Right Breast    IRRIGATION AND DEBRIDEMENT ABSCESS  11/17/2011   Procedure: IRRIGATION AND DEBRIDEMENT ABSCESS;  Surgeon: Imogene Burn. Georgette Dover, MD;  Location: [facility];  Service: General;  Laterality: Right;  irrigation and debridement right recurrent breast abscess   LARYNX SURGERY     LEFT HEART CATH AND CORONARY ANGIOGRAPHY N/A 04/09/2021   Procedure: LEFT HEART CATH AND CORONARY ANGIOGRAPHY;  Surgeon: Jettie Booze, MD;  Location: [facility];  Service: Cardiovascular;  Laterality: N/A;   LEFT HEART CATHETERIZATION WITH CORONARY ANGIOGRAM N/A 05/29/2012   Procedure: LEFT HEART CATHETERIZATION WITH CORONARY ANGIOGRAM & PTCA;  Surgeon: Troy Sine, MD;  [facility];  Service: Cardiovascular; Bifurcation dRCA-RPAD/PLA & rPDA  PTCA.   LEFT HEART CATHETERIZATION WITH CORONARY ANGIOGRAM N/A 11/08/2013   Procedure: LEFT HEART CATHETERIZATION WITH CORONARY ANGIOGRAM and Coronary Stent Intervention;  Surgeon: Leonie Man, MD;  Location: [facility] CATH LAB;  Service: Cardiovascular; mLAD 80% (FFR 0.73) - resolute DES 3.0 x 22 mm (postdilated to 3.3 mm->3.5 mm)   NM MYOVIEW LTD  01/26/2009   Normal study, no evidence of ischemia, EF 67%   ployp removed from voice box 03/30/12     RADIAL ARTERY HARVEST Right 06/30/2021   Procedure: RADIAL ARTERY HARVEST;  Surgeon: Lajuana Matte, MD;  Location: [facility];  Service: Open Heart Surgery;  Laterality: Right;    REFRACTIVE SURGERY  ~ 2010   right   TEE WITHOUT CARDIOVERSION N/A 06/30/2021   Procedure: TRANSESOPHAGEAL ECHOCARDIOGRAM (TEE);  Surgeon: Lajuana Matte, MD;  Location: [facility];  Service: Open Heart Surgery;  Laterality: N/A;   TONSILLECTOMY  1988   VIDEO BRONCHOSCOPY WITH ENDOBRONCHIAL ULTRASOUND Bilateral 08/21/2021   Procedure: VIDEO BRONCHOSCOPY WITH ENDOBRONCHIAL ULTRASOUND;  Surgeon: Garner Nash, DO;  Location: [facility];  Service: Pulmonary;  Laterality: Bilateral;    Current Medications: Current Meds  Medication Sig   aspirin EC 81 MG tablet Take 81 mg by mouth daily.   atorvastatin (LIPITOR) 40 MG tablet TAKE 1 TABLET (40 MG TOTAL) BY MOUTH DAILY.   busPIRone (BUSPAR) 15 MG  tablet TAKE 1/2 TABLET (7.5 MG TOTAL) BY MOUTH TWICE DAILY (Patient taking differently: Take 7.5 mg by mouth 2 (two) times daily.)   Carboxymethylcellul-Glycerin (LUBRICATING EYE DROPS OP) Place 1 drop into both eyes daily as needed (dry eyes).   carvedilol (COREG) 12.5 MG tablet TAKE 1 TABLET (12.5 MG TOTAL) BY MOUTH 2 (TWO) TIMES DAILY WITH A MEAL.   diclofenac Sodium (VOLTAREN) 1 % GEL Apply 1 application topically 3 times daily as needed for pain   ferrous sulfate 325 (65 FE) MG tablet Take 325 mg by mouth daily with breakfast.   fluticasone (FLONASE) 50 MCG/ACT nasal spray PLACE 1 SPRAY INTO BOTH NOSTRILS DAILY AS NEEDED (CONGESTION).   fluticasone-salmeterol (ADVAIR) 100-50 MCG/ACT AEPB Inhale 1 puff into the lungs 2 (two) times daily.   folic acid (FOLVITE) 1 MG tablet TAKE 1 TABLET (1 MG TOTAL) BY MOUTH DAILY.   furosemide (LASIX) 40 MG tablet Take 1 tablet (40 mg total) by mouth daily as needed. If you gain 3 lbs in 24 hours or 5 lbs in 1 week   glucose blood (ACCU-CHEK AVIVA PLUS) test strip Use as instructed   insulin detemir (LEVEMIR FLEXPEN) 100 UNIT/ML FlexPen Inject 15 Units into the skin 2 (two) times daily.   Insulin Pen Needle 32G X 4 MM MISC 1 Needle by Does not apply route in the  morning and at bedtime.   ipratropium (ATROVENT HFA) 17 MCG/ACT inhaler Inhale 2 puffs into the lungs every 4 (four) hours as needed for wheezing (COPD).   lidocaine-prilocaine (EMLA) cream Apply 1 Application topically as needed.   nicotine polacrilex (NICORETTE) 2 MG gum Take 1 each (2 mg total) by mouth as needed for smoking cessation.   nitroGLYCERIN (NITROSTAT) 0.4 MG SL tablet Place 1 tablet (0.4 mg total) under the tongue every 5 (five) minutes as needed for chest pain.   nystatin (MYCOSTATIN) 100000 UNIT/ML suspension Take 5 mLs (500,000 Units total) by mouth 4 (four) times daily.   ondansetron (ZOFRAN) 8 MG tablet Take 1 tablet (8 mg total) by mouth every 8 (eight) hours as needed for nausea or vomiting.   oxyCODONE (OXY IR/ROXICODONE) 5 MG immediate release tablet TAKE 1 TABLET (5 MG TOTAL) BY MOUTH EVERY 6 (SIX) HOURS AS NEEDED FOR SEVERE PAIN.   oxyCODONE ER (XTAMPZA ER) 9 MG C12A TAKE 9 MG BY MOUTH EVERY 12 (TWELVE) HOURS.   pantoprazole (PROTONIX) 40 MG tablet TAKE 1 TABLET (40 MG TOTAL) BY MOUTH DAILY.   polyethylene glycol powder (GLYCOLAX/MIRALAX) 17 GM/SCOOP powder Take 17 g by mouth daily as needed for mild constipation or moderate constipation.   potassium chloride SA (KLOR-CON M) 20 MEQ tablet Take 1 tablet (20 mEq total) by mouth daily as needed. On days you take Lasix   prochlorperazine (COMPAZINE) 10 MG tablet Take 1 tablet (10 mg total) by mouth every 6 (six) hours as needed for nausea or vomiting.   sertraline (ZOLOFT) 50 MG tablet TAKE 1 TABLET (50 MG TOTAL) BY MOUTH DAILY.   valACYclovir (VALTREX) 1000 MG tablet TAKE FOR 3 DAYS AS NEEDED FOR EACH OUTBREAK   vitamin B-12 (CYANOCOBALAMIN) 1000 MCG tablet Take 1,000 mcg by mouth daily.     Allergies:    Amoxil [amoxicillin], Chantix [varenicline], Suboxone [buprenorphine hcl-naloxone hcl], and Emend [fosaprepitant dimeglumine]   Social History: Social History   Socioeconomic History   Marital status: Single     Spouse name: Not on file   Number of children: 1   Years of education: Not  on file   Highest education level: Not on file  Occupational History   Occupation: homemaker   Occupation: disabled  Tobacco Use   Smoking status: Every Day    Packs/day: 0.50    Years: 42.00    Total pack years: 21.00    Types: Cigarettes    Start date: 01/12/1979   Smokeless tobacco: Never   Tobacco comments:    08/14/11 "quit for 3 wk 05/2011; was smoking 1 ppd since age 37 before I quit; at least I've cut down to 1/4 ppd",  Smoking 3 per day 08/20/2021. 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, marijuana, crack cocaine)   Drug use: Not Currently    Comment: 08/14/11 'quit everything in 2005"   Sexual activity: Yes    Partners: Male    Birth control/protection: Condom  Other Topics Concern   Not on file  Social History Narrative   Work: disability   Children: son, 19 yrs old   Regular exercise: some/ heart attack in May/ walks 1 mile 3 days a week   Caffeine use: daily, cup of coffee daily   Social Determinants of Health   Financial Resource Strain: High Risk (09/08/2021)   Overall Financial Resource Strain (CARDIA)    Difficulty of Paying Living Expenses: Very hard  Food Insecurity: Not on file  Transportation Needs: Not on file  Physical Activity: Not on file  Stress: Not on file  Social Connections: Not on file    Family History: The patient's family history includes COPD in her mother; Cancer in her maternal aunt and mother; Crohn's disease in her maternal aunt; Diabetes in her maternal grandmother; Esophageal cancer in her mother; Heart disease in her father and mother; Hyperlipidemia in her father; Hypertension in her brother and maternal grandmother; Stomach cancer in her mother. There is no history of Rectal cancer, Liver cancer, Colon cancer,  or Breast cancer.  ROS:   All other ROS reviewed and negative. Pertinent positives noted in the HPI.     EKGs/Labs/Other Studies Reviewed:   The following studies were personally reviewed by me today:  Recent Labs: 07/01/2021: Magnesium 2.6 12/16/2021: ALT 11; BUN 10; Creatinine 0.79; Hemoglobin 9.5; Platelet Count 276; Potassium 4.1; Sodium 138; TSH 1.760   Recent Lipid Panel    Component Value Date/Time   CHOL 131 05/15/2021 1615   CHOL 132 08/07/2012 0905   TRIG 77 05/15/2021 1615   TRIG 73 08/07/2012 0905   HDL 60 05/15/2021 1615   HDL 42 08/07/2012 0905   CHOLHDL 2.2 05/15/2021 1615   CHOLHDL 4 11/29/2014 1140   VLDL 23.8 11/29/2014 1140   LDLCALC 56 05/15/2021 1615   LDLCALC 75 08/07/2012 0905    Physical Exam:   VS:  BP 106/60   Pulse 75   Ht 5' 7.5" (1.715 m)   Wt 209 lb 6.4 oz (95 kg)   LMP 06/12/2016 (Approximate)   SpO2 98%   BMI 32.31 kg/m    Wt Readings from Last 3 Encounters:  12/24/21 209 lb 6.4 oz (95 kg)  12/16/21 208 lb 14.4 oz (94.8 kg)  12/08/21 214 lb 4.6 oz (97.2 kg)    General: Well nourished, well developed, in no acute distress Head: Atraumatic, normal size  Eyes: PEERLA, EOMI  Neck: Supple, no JVD Endocrine: No thryomegaly Cardiac: Normal S1,  S2; RRR; no murmurs, rubs, or gallops Lungs: Clear to auscultation bilaterally, no wheezing, rhonchi or rales  Abd: Soft, nontender, no hepatomegaly  Ext: No edema, pulses 2+ Musculoskeletal: No deformities, BUE and BLE strength normal and equal Skin: Warm and dry, no rashes   Neuro: Alert and oriented to person, place, time, and situation, CNII-XII grossly intact, no focal deficits  Psych: Normal mood and affect   ASSESSMENT:   Cassidy [name] is a 56 y.o. female who presents for the following: 1. Coronary artery disease involving coronary bypass graft of native heart without angina pectoris   2. Hyperlipidemia LDL goal <70   3. Essential hypertension     PLAN:   1. Coronary artery disease  involving coronary bypass graft of native heart without angina pectoris 2. Hyperlipidemia LDL goal <70 -CAD status post CABG.  Doing well.  Continue aspirin and statin.  Now diagnosed with stage IV lung cancer.  Globin trending down but no signs of bleeding.  Okay to stay on aspirin for now.  If this continues to trend down may need to hold aspirin.  Her echo showed normal LV function.  Her exam is normal.  She is without complaints of angina today.  3. Essential hypertension -Blood pressure has been a bit low.  Will continue carvedilol for now.  If blood pressure values continue to remain low or she feels dizzy or lightheaded we will stop this.  Disposition: Return in about 6 months (around 06/25/2022).  Medication Adjustments/Labs and Tests Ordered: Current medicines are reviewed at length with the patient today.  Concerns regarding medicines are outlined above.  No orders of the defined types were placed in this encounter.  No orders of the defined types were placed in this encounter.   Patient Instructions  Medication Instructions:  The current medical regimen is effective;  continue present plan and medications.  *If you need a refill on your cardiac medications before your next appointment, please call your pharmacy*   Follow-Up: At [facility], you and your health needs are our priority.  As part of our continuing mission to provide you with exceptional heart care, we have created designated Provider Care Teams.  These Care Teams include your primary Cardiologist (physician) and Advanced Practice Providers (APPs -  Physician Assistants and Nurse Practitioners) who all work together to provide you with the care you need, when you need it.  We recommend signing up for the patient portal called "MyChart".  Sign up information is provided on this After Visit Summary.  MyChart is used to connect with patients for Virtual Visits (Telemedicine).  Patients are able to view lab/test  results, encounter notes, upcoming appointments, etc.  Non-urgent messages can be sent to your provider as well.   To learn more about what you can do with MyChart, go to [email or web address].    Your next appointment:   6 month(s)  The format for your next appointment:   In Person  Provider:   Evalina Field, MD          Time Spent with Patient: I have spent a total of 25 minutes with patient reviewing hospital notes, telemetry, EKGs, labs and examining the patient as well as establishing an assessment and plan that was discussed with the patient.  > 50% of time was spent in direct patient care.  Signed, Addison Naegeli. Audie Box, MD, [facility]  [address] [place], [place] [address] [phone]  12/24/2021 3:53 PM

## 2021-12-23 ENCOUNTER — Other Ambulatory Visit: Payer: Medicaid Other

## 2021-12-23 ENCOUNTER — Ambulatory Visit: Payer: Medicaid Other | Admitting: Cardiovascular Disease

## 2021-12-24 ENCOUNTER — Encounter: Payer: Self-pay | Admitting: Cardiovascular Disease

## 2021-12-24 ENCOUNTER — Ambulatory Visit: Payer: Medicaid Other | Attending: Cardiovascular Disease | Admitting: Cardiovascular Disease

## 2021-12-24 VITALS — BP 106/60 | HR 75 | Ht 67.5 in | Wt 209.4 lb

## 2021-12-24 DIAGNOSIS — I10 Essential (primary) hypertension: Secondary | ICD-10-CM | POA: Diagnosis not present

## 2021-12-24 DIAGNOSIS — I25810 Atherosclerosis of coronary artery bypass graft(s) without angina pectoris: Secondary | ICD-10-CM

## 2021-12-24 DIAGNOSIS — E785 Hyperlipidemia, unspecified: Secondary | ICD-10-CM

## 2021-12-24 NOTE — Patient Instructions (Signed)
Medication Instructions:  The current medical regimen is effective;  continue present plan and medications.  *If you need a refill on your cardiac medications before your next appointment, please call your pharmacy*   Follow-Up: At [facility], you and your health needs are our priority.  As part of our continuing mission to provide you with exceptional heart care, we have created designated Provider Care Teams.  These Care Teams include your primary Cardiologist (physician) and Advanced Practice Providers (APPs -  Physician Assistants and Nurse Practitioners) who all work together to provide you with the care you need, when you need it.  We recommend signing up for the patient portal called "MyChart".  Sign up information is provided on this After Visit Summary.  MyChart is used to connect with patients for Virtual Visits (Telemedicine).  Patients are able to view lab/test results, encounter notes, upcoming appointments, etc.  Non-urgent messages can be sent to your provider as well.   To learn more about what you can do with MyChart, go to [email or web address].    Your next appointment:   6 month(s)  The format for your next appointment:   In Person  Provider:   Evalina Field, MD

## 2021-12-30 ENCOUNTER — Other Ambulatory Visit: Payer: Medicaid Other

## 2022-01-04 NOTE — Progress Notes (Unsigned)
[facility] OFFICE PROGRESS NOTE  [name], DO [place] [address]  DIAGNOSIS: Stage IV (T2a, N2, M1c) non-small cell lung cancer, adenocarcinoma presented with right upper lobe lung mass in addition to right hilar and mediastinal lymphadenopathy and innumerable bilateral pulmonary nodules as well as liver and bone metastasis.  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    Detected Alteration(s) / Biomarker(s) Associated FDA-approved therapies  Clinical Trial Availability      % cfDNA or Amplification   KRAS G12C  approved by FDA Adagrasib, Sotorasib Yes   5.3%   IDH1 R132L  approved in other indication Ivosidenib, Olutasidenib Yes      1.9%   PD-L1 expression 89%  PRIOR THERAPY: palliative radiation to the painful metastatic bone lesions at L4 and the sacrum under the care of Dr. Lisbeth Renshaw. Last day on 10/22/21   CURRENT THERAPY: 1) Systemic chemotherapy with carboplatin for AUC of 5, Alimta 500 Mg/M2 and Keytruda 200 Mg IV every 3 weeks.  First dose September 02, 2021.  Status post 6 cycles.  Starting from cycle #5, the patient will start maintenance Alimta and Keytruda IV every 3 weeks.   INTERVAL HISTORY: Cassidy [name] 56 y.o. female returns to the clinic today for a follow-up visit. The patient is feeling well today without any concerning complaints. The patient is currently undergoing palliative systemic chemotherapy/immunotherapy. The patient has been tolerating treatment fairly well. She has some manageable side effects following treatment with fatigue and controlled nausea. The patient denies any fever, chills, or night sweats. Her appetite decreases a few days following treatment. Certain foods are not appetizing. Fruit tastes good to her. She also reports belching sometimes. She had an episode recently where she was swallowing her pills and feels like it got stuck. She tried to drink water and felt like this was getting stuck as well.  She sees Dr. Remi Haggard from GI. Denies changes in her breathing. She uses mucinex for cough in the AM due to phlegm settling. She denies any vomiting. She uses zofran and compazine for her nausea which is effective. She denies any headache or visual changes. She has a history of migraines but states these have "eased up". Of note, she is supposed to have a repeat brain MRI with the estimated date of 1/4. This is not scheduled. She is wondering if she should have this scheduled. She is here today for evaluation and repeat blood work before starting cycle #7.   Since being diagnosed, the patient has had pain secondary to the metastatic bone lesions.  She previously completed palliative radiation.  She is being followed closely by palliative care who is being treated with Xtampza extended release and oxycodone for breakthrough pain.  She is also on a bowel regimen. She is scheduled to see palliative care today.  She is also followed closely by calling from the counselor team which has been helpful for her.   MEDICAL HISTORY: Past Medical History:  Diagnosis Date   Adenocarcinoma of right lung, stage 4 ([place]) 08/26/2021   Anemia    has sickle cell trait   Atherosclerotic heart disease of native coronary artery with angina pectoris ([place]) 05/2012, 10/2013   a.  s/p PCTA to dRCA and ostial RPAV-PLA vessel + PDA branch (05/2012)  b. [place] s/p DES to mLAD - Resolute DES 3.0 x 22 (3.12m -->3.3 mm)   Bipolar depression ([place]    Breast abscess    a. right side.  COPD (chronic obstructive pulmonary disease) (HCC)    Depression    Diabetic peripheral neuropathy (HCC)    Fibromyalgia    Genital warts    GERD (gastroesophageal reflux disease)    History of hiatal hernia    HLD (hyperlipidemia)    Hypertension    Pain in limb    a. LE VENOUS DUPLEX, 02/05/2009 - no evidence of deep vein thrombosis, Baker's cyst   Pneumonia    PTSD (post-traumatic stress disorder)    Sickle cell trait (HCC)    Tobacco abuse     Tooth caries    Type II diabetes mellitus (HCC)     ALLERGIES:  is allergic to amoxil [amoxicillin], chantix [varenicline], suboxone [buprenorphine hcl-naloxone hcl], and emend [fosaprepitant dimeglumine].  MEDICATIONS:  Current Outpatient Medications  Medication Sig Dispense Refill   aspirin EC 81 MG tablet Take 81 mg by mouth daily.     atorvastatin (LIPITOR) 40 MG tablet TAKE 1 TABLET (40 MG TOTAL) BY MOUTH DAILY. 90 tablet 3   busPIRone (BUSPAR) 15 MG tablet TAKE 1/2 TABLET (7.5 MG TOTAL) BY MOUTH TWICE DAILY (Patient taking differently: Take 7.5 mg by mouth 2 (two) times daily.) 30 tablet 11   Carboxymethylcellul-Glycerin (LUBRICATING EYE DROPS OP) Place 1 drop into both eyes daily as needed (dry eyes).     carvedilol (COREG) 12.5 MG tablet TAKE 1 TABLET (12.5 MG TOTAL) BY MOUTH 2 (TWO) TIMES DAILY WITH A MEAL. 180 tablet 1   diclofenac Sodium (VOLTAREN) 1 % GEL Apply 1 application topically 3 times daily as needed for pain 2 g 3   ferrous sulfate 325 (65 FE) MG tablet Take 325 mg by mouth daily with breakfast.     fluticasone (FLONASE) 50 MCG/ACT nasal spray PLACE 1 SPRAY INTO BOTH NOSTRILS DAILY AS NEEDED (CONGESTION). 16 g 1   fluticasone-salmeterol (ADVAIR) 100-50 MCG/ACT AEPB Inhale 1 puff into the lungs 2 (two) times daily. 1 each 3   folic acid (FOLVITE) 1 MG tablet TAKE 1 TABLET (1 MG TOTAL) BY MOUTH DAILY. 30 tablet 3   furosemide (LASIX) 40 MG tablet Take 1 tablet (40 mg total) by mouth daily as needed. If you gain 3 lbs in 24 hours or 5 lbs in 1 week 30 tablet 1   glucose blood (ACCU-CHEK AVIVA PLUS) test strip Use as instructed 50 each 1   insulin detemir (LEVEMIR FLEXPEN) 100 UNIT/ML FlexPen Inject 15 Units into the skin 2 (two) times daily. 15 mL 1   Insulin Pen Needle 32G X 4 MM MISC 1 Needle by Does not apply route in the morning and at bedtime. 120 each 2   ipratropium (ATROVENT HFA) 17 MCG/ACT inhaler Inhale 2 puffs into the lungs every 4 (four) hours as needed for  wheezing (COPD).     lidocaine-prilocaine (EMLA) cream Apply 1 Application topically as needed. 30 g 2   nicotine polacrilex (NICORETTE) 2 MG gum Take 1 each (2 mg total) by mouth as needed for smoking cessation. 100 tablet 0   nitroGLYCERIN (NITROSTAT) 0.4 MG SL tablet Place 1 tablet (0.4 mg total) under the tongue every 5 (five) minutes as needed for chest pain. 25 tablet 5   nystatin (MYCOSTATIN) 100000 UNIT/ML suspension Take 5 mLs (500,000 Units total) by mouth 4 (four) times daily. 60 mL 0   ondansetron (ZOFRAN) 8 MG tablet Take 1 tablet (8 mg total) by mouth every 8 (eight) hours as needed for nausea or vomiting. 30 tablet 1   oxyCODONE (OXY IR/ROXICODONE) 5  MG immediate release tablet TAKE 1 TABLET (5 MG TOTAL) BY MOUTH EVERY 6 (SIX) HOURS AS NEEDED FOR SEVERE PAIN. 60 tablet 0   oxyCODONE ER (XTAMPZA ER) 9 MG C12A TAKE 9 MG BY MOUTH EVERY 12 (TWELVE) HOURS. 60 capsule 0   pantoprazole (PROTONIX) 40 MG tablet TAKE 1 TABLET (40 MG TOTAL) BY MOUTH DAILY. 30 tablet 1   polyethylene glycol powder (GLYCOLAX/MIRALAX) 17 GM/SCOOP powder Take 17 g by mouth daily as needed for mild constipation or moderate constipation. 3350 g 1   potassium chloride SA (KLOR-CON M) 20 MEQ tablet Take 1 tablet (20 mEq total) by mouth daily as needed. On days you take Lasix 30 tablet 1   prochlorperazine (COMPAZINE) 10 MG tablet Take 1 tablet (10 mg total) by mouth every 6 (six) hours as needed for nausea or vomiting. 30 tablet 0   sertraline (ZOLOFT) 50 MG tablet TAKE 1 TABLET (50 MG TOTAL) BY MOUTH DAILY. 30 tablet 3   valACYclovir (VALTREX) 1000 MG tablet TAKE FOR 3 DAYS AS NEEDED FOR EACH OUTBREAK 30 tablet 11   vitamin B-12 (CYANOCOBALAMIN) 1000 MCG tablet Take 1,000 mcg by mouth daily.     No current facility-administered medications for this visit.   Facility-Administered Medications Ordered in Other Visits  Medication Dose Route Frequency Provider Last Rate Last Admin   heparin lock flush 100 unit/mL  500  Units Intracatheter Once PRN Curt Bears, MD       pembrolizumab [facility]) 200 mg in sodium chloride 0.9 % 50 mL chemo infusion  200 mg Intravenous Once Curt Bears, MD       PEMEtrexed (ALIMTA) 1,100 mg in sodium chloride 0.9 % 100 mL chemo infusion  500 mg/m2 (Treatment Plan Recorded) Intravenous Once Curt Bears, MD       prochlorperazine (COMPAZINE) tablet 10 mg  10 mg Oral Once Curt Bears, MD       sodium chloride flush (NS) 0.9 % injection 10 mL  10 mL Intracatheter PRN Curt Bears, MD        SURGICAL HISTORY:  Past Surgical History:  Procedure Laterality Date   [place]   "TVT"   BLADDER SURGERY  2003   "TVT"   BREAST EXCISIONAL BIOPSY     BREAST SURGERY Right    I&D for multiple abscesses   CARDIAC CATHETERIZATION     CORONARY ARTERY BYPASS GRAFT N/A 06/30/2021   Procedure: CORONARY ARTERY BYPASS GRAFTING (CABG) x3 USING LEFT INTERNAL MAMMARY ARTERY,  RIGHT RADIAL ARTERY AND LEFT GREATER SAPHENOUS VEIN;  Surgeon: Lajuana Matte, MD;  Location: [facility];  Service: Open Heart Surgery;  Laterality: N/A;   cutting balloon     ENDOVEIN HARVEST OF GREATER SAPHENOUS VEIN Left 06/30/2021   Procedure: ENDOVEIN HARVEST OF GREATER SAPHENOUS VEIN;  Surgeon: Lajuana Matte, MD;  Location: [facility];  Service: Open Heart Surgery;  Laterality: Left;   EYE SURGERY Left    laser surgery   FINE NEEDLE ASPIRATION  08/21/2021   Procedure: FINE NEEDLE ASPIRATION (FNA) LINEAR;  Surgeon: Garner Nash, DO;  Location: [facility] ENDOSCOPY;  Service: Pulmonary;;   INCISE AND DRAIN ABCESS  ; 04/26/11; 08/13/11   right breast   INCISION AND DRAINAGE ABSCESS Right 05/09/2012   Procedure: INCISION AND DRAINAGE RIGHT BREAST ABSCESS;  Surgeon: Imogene Burn. Georgette Dover, MD;  Location: [facility];  Service: General;  Laterality: Right;   INCISION AND DRAINAGE ABSCESS Right 02/08/2014   Procedure: INCISION AND DRAINAGE RIGHT BREAST ABSCESS;  Surgeon:  Donnie Mesa, MD;  Location: [facility];   Service: General;  Laterality: Right;   INCISION AND DRAINAGE ABSCESS Right 06/14/2014   Procedure: INCISION AND DRAINAGE RIGHT BREAST ABSCESS;  Surgeon: Donnie Mesa, MD;  Location: [facility];  Service: General;  Laterality: Right;   IR IMAGING GUIDED PORT INSERTION  12/08/2021   IRRIGATION AND DEBRIDEMENT ABSCESS  12/19/2010   Procedure: IRRIGATION AND DEBRIDEMENT ABSCESS;  Surgeon: Rolm Bookbinder, MD;  Location: [facility];  Service: General;  Laterality: Right;   IRRIGATION AND DEBRIDEMENT ABSCESS  08/13/2011   Procedure: MINOR INCISION AND DRAINAGE OF ABSCESS;  Surgeon: Gwenyth Ober, MD;  Location: [facility];  Service: General;  Laterality: Right;  Right Breast    IRRIGATION AND DEBRIDEMENT ABSCESS  11/17/2011   Procedure: IRRIGATION AND DEBRIDEMENT ABSCESS;  Surgeon: Imogene Burn. Georgette Dover, MD;  Location: [facility];  Service: General;  Laterality: Right;  irrigation and debridement right recurrent breast abscess   LARYNX SURGERY     LEFT HEART CATH AND CORONARY ANGIOGRAPHY N/A 04/09/2021   Procedure: LEFT HEART CATH AND CORONARY ANGIOGRAPHY;  Surgeon: Jettie Booze, MD;  Location: [facility] CV [facility];  Service: Cardiovascular;  Laterality: N/A;   LEFT HEART CATHETERIZATION WITH CORONARY ANGIOGRAM N/A 05/29/2012   Procedure: LEFT HEART CATHETERIZATION WITH CORONARY ANGIOGRAM & PTCA;  Surgeon: Troy Sine, MD;  Location: [facility] CATH LAB;  Service: Cardiovascular; Bifurcation dRCA-RPAD/PLA & rPDA  PTCA.   LEFT HEART CATHETERIZATION WITH CORONARY ANGIOGRAM N/A 11/08/2013   Procedure: LEFT HEART CATHETERIZATION WITH CORONARY ANGIOGRAM and Coronary Stent Intervention;  Surgeon: Leonie Man, MD;  Location: [facility] CATH LAB;  Service: Cardiovascular; mLAD 80% (FFR 0.73) - resolute DES 3.0 x 22 mm (postdilated to 3.3 mm->3.5 mm)   NM MYOVIEW LTD  01/26/2009   Normal study, no evidence of ischemia, EF 67%   ployp removed from voice box 03/30/12     RADIAL ARTERY HARVEST Right 06/30/2021   Procedure: RADIAL ARTERY  HARVEST;  Surgeon: Lajuana Matte, MD;  Location: [facility];  Service: Open Heart Surgery;  Laterality: Right;   REFRACTIVE SURGERY  ~ 2010   right   TEE WITHOUT CARDIOVERSION N/A 06/30/2021   Procedure: TRANSESOPHAGEAL ECHOCARDIOGRAM (TEE);  Surgeon: Lajuana Matte, MD;  Location: [facility];  Service: Open Heart Surgery;  Laterality: N/A;   TONSILLECTOMY  1988   VIDEO BRONCHOSCOPY WITH ENDOBRONCHIAL ULTRASOUND Bilateral 08/21/2021   Procedure: VIDEO BRONCHOSCOPY WITH ENDOBRONCHIAL ULTRASOUND;  Surgeon: Garner Nash, DO;  Location: [facility];  Service: Pulmonary;  Laterality: Bilateral;    REVIEW OF SYSTEMS:   Constitutional: Positive for stable fatigue. Negative for appetite change, chills, fever and unexpected weight change.  HENT: Positive for episode of dysphagia. Negative for mouth sores, nosebleeds, or sore throat.  Eyes: Negative for eye problems and icterus.  Respiratory: Positive for stable mild cough in the AM. Negative for hemoptysis, shortness of breath and wheezing.   Cardiovascular: Negative for chest pain and leg swelling.  Gastrointestinal: Positive for mild nausea following treatment. Positive for occasional belching. Negative for abdominal pain, constipation, diarrhea, and vomiting.  Genitourinary: Positive for urinary urgency and some dysuria. Negative for bladder incontinence, frequency and hematuria.   Musculoskeletal: Positive for improved pain control. Negative for gait problem, neck pain and neck stiffness.  Skin: Negative for itching and rash.  Neurological: Negative for dizziness, extremity weakness, gait problem, headaches, light-headedness and seizures.  Hematological: Negative for adenopathy. Does not bruise/bleed easily.  Psychiatric/Behavioral: Negative for confusion, depression and sleep  disturbance. The patient is not nervous/anxious.       PHYSICAL EXAMINATION:  Blood pressure (!) 141/62, pulse 72, temperature 98.2 F (36.8 C), temperature source  Oral, resp. rate 18, height 5' 7.5" (1.715 m), weight 210 lb 8 oz (95.5 kg), last menstrual period 06/12/2016, SpO2 100 %.  ECOG PERFORMANCE STATUS: 1  Physical Exam  Constitutional: Oriented to person, place, and time and well-developed, well-nourished, and in no distress.  HENT:  Head: Normocephalic and atraumatic.  Mouth/Throat: Oropharynx is clear and moist. No oropharyngeal exudate.  Eyes: Conjunctivae are normal. Right eye exhibits no discharge. Left eye exhibits no discharge. No scleral icterus.  Neck: Normal range of motion. Neck supple.  Cardiovascular: Normal rate, regular rhythm, normal heart sounds and intact distal pulses.   Pulmonary/Chest: Effort normal and breath sounds normal. No respiratory distress. No wheezes. No rales.  Abdominal: Soft. Bowel sounds are normal. Exhibits no distension and no mass. There is no tenderness.  Musculoskeletal: Normal range of motion. Exhibits no edema.  Lymphadenopathy:    No cervical adenopathy.  Neurological: Alert and oriented to person, place, and time. Exhibits normal muscle tone. Gait normal. Coordination normal.  Skin: Multiple large scars (right forearm and sternum). Skin is warm and dry. No rash noted. Not diaphoretic. No erythema. No pallor.  Psychiatric: Mood, memory and judgment normal.  Vitals reviewed.  LABORATORY DATA: Lab Results  Component Value Date   WBC 4.2 01/06/2022   HGB 9.3 (L) 01/06/2022   HCT 26.6 (L) 01/06/2022   MCV 98.5 01/06/2022   PLT 244 01/06/2022      Chemistry      Component Value Date/Time   NA 139 01/06/2022 1450   NA 140 04/03/2021 1059   K 3.4 (L) 01/06/2022 1450   CL 109 01/06/2022 1450   CO2 24 01/06/2022 1450   BUN 17 01/06/2022 1450   BUN 13 04/03/2021 1059   CREATININE 0.76 01/06/2022 1450   CREATININE 0.65 11/06/2013 1520      Component Value Date/Time   CALCIUM 9.0 01/06/2022 1450   ALKPHOS 61 01/06/2022 1450   AST 22 01/06/2022 1450   ALT 31 01/06/2022 1450   BILITOT 0.5  01/06/2022 1450       RADIOGRAPHIC STUDIES:  IR IMAGING GUIDED PORT INSERTION  Addendum Date: 12/16/2021   ADDENDUM REPORT: 12/16/2021 11:59 ADDENDUM: Addendum to typographical error in catheter tip position. Should read. Successful placement of a LEFT internal jugular approach power injectable Port-A-Cath. The tip of the catheter is positioned within the proximal RIGHT atrium. Electronically Signed   By: Michaelle Birks M.D.   On: 12/16/2021 11:59   Result Date: 12/16/2021 INDICATION: RIGHT lung cancer. EXAM: IMPLANTED PORT A CATH PLACEMENT WITH ULTRASOUND AND FLUOROSCOPIC GUIDANCE MEDICATIONS: None ANESTHESIA/SEDATION: Moderate (conscious) sedation was employed during this procedure. A total of Versed 3 mg and Fentanyl 150 mcg was administered intravenously. Moderate Sedation Time: 21 minutes. The patient's level of consciousness and vital signs were monitored continuously by radiology nursing throughout the procedure under my direct supervision. FLUOROSCOPY TIME:  Fluoroscopic dose; 0 mGy COMPLICATIONS: None immediate. PROCEDURE: The procedure, risks, benefits, and alternatives were explained to the patient. Questions regarding the procedure were encouraged and answered. The patient understands and consents to the procedure. The LEFT neck and chest were prepped with chlorhexidine in a sterile fashion, and a sterile drape was applied covering the operative field. Maximum barrier sterile technique with sterile gowns and gloves were used for the procedure. A timeout was performed prior to the  initiation of the procedure. Local anesthesia was provided with 1% lidocaine with epinephrine. After creating a small venotomy incision, a micropuncture kit was utilized to access the internal jugular vein under direct, real-time ultrasound guidance. Ultrasound image documentation was performed. The microwire was kinked to measure appropriate catheter length. A subcutaneous port pocket was then created along the upper  chest wall utilizing a combination of sharp and blunt dissection. The pocket was irrigated with sterile saline. A single lumen ISP power injectable port was chosen for placement. The 8 Fr catheter was tunneled from the port pocket site to the venotomy incision. The port was placed in the pocket. The external catheter was trimmed to appropriate length. At the venotomy, an 8 Fr peel-away sheath was placed over a guidewire under fluoroscopic guidance. The catheter was then placed through the sheath and the sheath was removed. Final catheter positioning was confirmed and documented with a fluoroscopic spot radiograph. The port was accessed with a Huber needle, aspirated and flushed with heparinized saline. The port pocket incision was closed with interrupted 3-0 Vicryl suture then Dermabond was applied, including at the venotomy incision. Dressings were placed. The patient tolerated the procedure well without immediate post procedural complication. IMPRESSION: Successful placement of a LEFT internal jugular approach power injectable Port-A-Cath. The tip of the catheter is positioned within the proximal LEFT atrium. The catheter is ready for immediate use. Michaelle Birks, MD [facility] Electronically Signed: By: Michaelle Birks M.D. On: 12/08/2021 17:16     ASSESSMENT/PLAN:  This is a very pleasant 56 year old African-American female diagnosed with stage IV (T2 a, N2, M1 C) non-small cell lung cancer, adenocarcinoma.  The patient presented with a right upper lobe lung mass in addition to right hilar and mediastinal lymphadenopathy.  She also has innumerable bilateral pulmonary nodules, metastatic bone, liver, and adrenal lesions.  She was diagnosed in August 2023.  She is positive for K-ras G12 C mutation and her PD-L1 expression is 89%    The patient completed  palliative radiotherapy to the metastatic bone lesions at L4 and the sacrum under the care of Dr.  Lisbeth Renshaw.  The last day radiation is tentatively scheduled for 10/22/21.   The patient is currently undergoing palliative systemic chemotherapy with carboplatin for an AUC of 5, Alimta 500 mg per metered square, Keytruda 200 mg IV every 3 weeks.  She is status post 6 cycles.  Starting from cycle #5 the patient started maintenance Alimta and Keytruda.  Labs were reviewed. Recommend she proceed with cycle #7 today as scheduled.   I will arrange for a restaging CT scan prior to her next cycle of treatment.   We will see her back for follow-up visit in 3 weeks for evaluation and repeat blood work before undergoing the next cycle of treatment with cycle #8  She will continue to follow with palliative care.  He is scheduled to see them today.  The patient's pain is well controlled with her current regimen.   Patient had a brain MRI performed which showed a possible punctate left frontal lobe white matter metastasis that is subtle but poorly resolved vessel not excluded.  We will monitor this closely on repeat brain MRI in 3 months.  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 She is supposed to have this around 1/4. She was given the number to radiology today.   We reviewed nutrition today. She will continue to use mucinex for her phlegm.   For her swallowing  and dysphagia, she states she typically takes 2 pills at a time with her morning meds. Encouraged her to take 1 at a time and drink plenty of fluid in between. Recommended she drink a whole glass of water with her morning meds. If she has persistent dysphagia or worsening, she is established with GI and she can always call and be evaluated sooner.   The patient was advised to call immediately if she has any concerning symptoms in the interval. The patient voices understanding of current disease status and treatment options and is in agreement with the current care plan. All questions were answered. The patient knows to call the clinic with  any problems, questions or concerns. We can certainly see the patient much sooner if necessary            Orders Placed This Encounter  Procedures   CT Chest W Contrast    Standing Status:   Future    Standing Expiration Date:   01/06/2023    Order Specific Question:   If indicated for the ordered procedure, I authorize the administration of contrast media per Radiology protocol    Answer:   Yes    Order Specific Question:   Does the patient have a contrast media/X-ray dye allergy?    Answer:   No    Order Specific Question:   Is patient pregnant?    Answer:   No    Order Specific Question:   Preferred imaging location?    Answer:   [facility]   CT Abdomen Pelvis W Contrast    Standing Status:   Future    Standing Expiration Date:   01/06/2023    Order Specific Question:   If indicated for the ordered procedure, I authorize the administration of contrast media per Radiology protocol    Answer:   Yes    Order Specific Question:   Does the patient have a contrast media/X-ray dye allergy?    Answer:   No    Order Specific Question:   Is patient pregnant?    Answer:   No    Order Specific Question:   Preferred imaging location?    Answer:   [facility]    Order Specific Question:   Is Oral Contrast requested for this exam?    Answer:   Yes, Per Radiology protocol      The total time spent in the appointment was 20-29 minutes.   Krystn Dermody L Marykate Heuberger, PA-C 01/06/22

## 2022-01-05 NOTE — Progress Notes (Unsigned)
[facility]  Telephone:([phone]) [phone] Fax:(336) [phone]   Name: Cassidy [name] Date: 01/05/2022 MRN: [id number]  DOB: December 09, 1965  Patient Care Team: [name], DO as PCP - General (Family Medicine) O'Neal, Cassie Freer, MD as PCP - Cardiology (Cardiology) Rolm Bookbinder, MD as Consulting Physician (General Surgery)    INTERVAL HISTORY: Cassidy [name] is a 56 y.o. 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  Palliative ask to see for symptom management and goals of care.   SOCIAL HISTORY:     reports that she has been smoking cigarettes. She started smoking about 43 years ago. She has a 21.00 pack-year smoking history. She has never used smokeless tobacco. She reports that she does not currently use drugs. She reports that she does not drink alcohol.  ADVANCE DIRECTIVES:    CODE STATUS:   PAST MEDICAL HISTORY: Past Medical History:  Diagnosis Date   Adenocarcinoma of right lung, stage 4 ([place]) 08/26/2021   Anemia    has sickle cell trait   Atherosclerotic heart disease of native coronary artery with angina pectoris ([place]) 05/2012, 10/2013   a.  s/p PCTA to dRCA and ostial RPAV-PLA vessel + PDA branch (05/2012)  b. [place] s/p DES to mLAD - Resolute DES 3.0 x 22 (3.63mm -->3.3 mm)   Bipolar depression ([place])    Breast abscess    a. right side.    COPD (chronic obstructive pulmonary disease) (HCC)    Depression    Diabetic peripheral neuropathy (HCC)    Fibromyalgia    Genital warts    GERD (gastroesophageal reflux disease)    History of hiatal hernia    HLD (hyperlipidemia)    Hypertension    Pain in limb    a. LE VENOUS DUPLEX, 02/05/2009 - no evidence of deep vein thrombosis, Baker's cyst   Pneumonia    PTSD (post-traumatic stress disorder)    Sickle cell  trait (HCC)    Tobacco abuse    Tooth caries    Type II diabetes mellitus (HCC)     ALLERGIES:  is allergic to amoxil [amoxicillin], chantix [varenicline], suboxone [buprenorphine hcl-naloxone hcl], and emend [fosaprepitant dimeglumine].  MEDICATIONS:  Current Outpatient Medications  Medication Sig Dispense Refill   aspirin EC 81 MG tablet Take 81 mg by mouth daily.     atorvastatin (LIPITOR) 40 MG tablet TAKE 1 TABLET (40 MG TOTAL) BY MOUTH DAILY. 90 tablet 3   busPIRone (BUSPAR) 15 MG tablet TAKE 1/2 TABLET (7.5 MG TOTAL) BY MOUTH TWICE DAILY (Patient taking differently: Take 7.5 mg by mouth 2 (two) times daily.) 30 tablet 11   Carboxymethylcellul-Glycerin (LUBRICATING EYE DROPS OP) Place 1 drop into both eyes daily as needed (dry eyes).     carvedilol (COREG) 12.5 MG tablet TAKE 1 TABLET (12.5 MG TOTAL) BY MOUTH 2 (TWO) TIMES DAILY WITH A MEAL. 180 tablet 1   diclofenac Sodium (VOLTAREN) 1 % GEL Apply 1 application topically 3 times daily as needed for pain 2 g 3   ferrous sulfate 325 (65 FE) MG tablet Take 325 mg by mouth daily with breakfast.     fluticasone (FLONASE) 50 MCG/ACT nasal spray PLACE 1 SPRAY INTO BOTH NOSTRILS DAILY AS NEEDED (CONGESTION). 16 g 1   fluticasone-salmeterol (ADVAIR) 100-50 MCG/ACT AEPB Inhale 1 puff into the lungs 2 (two) times daily. 1 each 3   folic  acid (FOLVITE) 1 MG tablet TAKE 1 TABLET (1 MG TOTAL) BY MOUTH DAILY. 30 tablet 3   furosemide (LASIX) 40 MG tablet Take 1 tablet (40 mg total) by mouth daily as needed. If you gain 3 lbs in 24 hours or 5 lbs in 1 week 30 tablet 1   glucose blood (ACCU-CHEK AVIVA PLUS) test strip Use as instructed 50 each 1   insulin detemir (LEVEMIR FLEXPEN) 100 UNIT/ML FlexPen Inject 15 Units into the skin 2 (two) times daily. 15 mL 1   Insulin Pen Needle 32G X 4 MM MISC 1 Needle by Does not apply route in the morning and at bedtime. 120 each 2   ipratropium (ATROVENT HFA) 17 MCG/ACT inhaler Inhale 2 puffs into the lungs every  4 (four) hours as needed for wheezing (COPD).     lidocaine-prilocaine (EMLA) cream Apply 1 Application topically as needed. 30 g 2   nicotine polacrilex (NICORETTE) 2 MG gum Take 1 each (2 mg total) by mouth as needed for smoking cessation. 100 tablet 0   nitroGLYCERIN (NITROSTAT) 0.4 MG SL tablet Place 1 tablet (0.4 mg total) under the tongue every 5 (five) minutes as needed for chest pain. 25 tablet 5   nystatin (MYCOSTATIN) 100000 UNIT/ML suspension Take 5 mLs (500,000 Units total) by mouth 4 (four) times daily. 60 mL 0   ondansetron (ZOFRAN) 8 MG tablet Take 1 tablet (8 mg total) by mouth every 8 (eight) hours as needed for nausea or vomiting. 30 tablet 1   oxyCODONE (OXY IR/ROXICODONE) 5 MG immediate release tablet TAKE 1 TABLET (5 MG TOTAL) BY MOUTH EVERY 6 (SIX) HOURS AS NEEDED FOR SEVERE PAIN. 60 tablet 0   oxyCODONE ER (XTAMPZA ER) 9 MG C12A TAKE 9 MG BY MOUTH EVERY 12 (TWELVE) HOURS. 60 capsule 0   pantoprazole (PROTONIX) 40 MG tablet TAKE 1 TABLET (40 MG TOTAL) BY MOUTH DAILY. 30 tablet 1   polyethylene glycol powder (GLYCOLAX/MIRALAX) 17 GM/SCOOP powder Take 17 g by mouth daily as needed for mild constipation or moderate constipation. 3350 g 1   potassium chloride SA (KLOR-CON M) 20 MEQ tablet Take 1 tablet (20 mEq total) by mouth daily as needed. On days you take Lasix 30 tablet 1   prochlorperazine (COMPAZINE) 10 MG tablet Take 1 tablet (10 mg total) by mouth every 6 (six) hours as needed for nausea or vomiting. 30 tablet 0   sertraline (ZOLOFT) 50 MG tablet TAKE 1 TABLET (50 MG TOTAL) BY MOUTH DAILY. 30 tablet 3   valACYclovir (VALTREX) 1000 MG tablet TAKE FOR 3 DAYS AS NEEDED FOR EACH OUTBREAK 30 tablet 11   vitamin B-12 (CYANOCOBALAMIN) 1000 MCG tablet Take 1,000 mcg by mouth daily.     No current facility-administered medications for this visit.    VITAL SIGNS: LMP 06/12/2016 (Approximate)  There were no vitals filed for this visit.  Estimated body mass index is 32.31 kg/m  as calculated from the following:   Height as of 12/24/21: 5' 7.5" (1.715 m).   Weight as of 12/24/21: 209 lb 6.4 oz (95 kg).   PERFORMANCE STATUS (ECOG) : 1 - Symptomatic but completely ambulatory   Physical Exam General: NAD Pulmonary: normal breathing pattern  Extremities: no edema, no joint deformities Skin: no rashes Neurological: AAO x3, mood appropriate   IMPRESSION: ***     Neoplasm related pain   2. Constipation   I discussed the importance of continued conversation with family and their medical providers regarding overall plan of care and  treatment options, ensuring decisions are within the context of the patients values and GOCs.  PLAN: Continue Xtampza 9 mg every 12 hours Oxy IR 5-10 mg every 6 hours as needed for breakthrough pain  MiraLAX for bowel regimen. Unable to tolerate Linzess. I will plan to see patient back in 3-4 weeks in collaboration with her oncology appointments.   Patient expressed understanding and was in agreement with this plan. She also understands that She can call the clinic at any time with any questions, concerns, or complaints.     Any controlled substances utilized were prescribed in the context of palliative care. PDMP has been reviewed.   Time Total: *** min   Visit consisted of counseling and education dealing with the complex and emotionally intense issues of symptom management and palliative care in the setting of serious and potentially life-threatening illness.Greater than 50%  of this time was spent counseling and coordinating care related to the above assessment and plan.  Alda Lea, AGPCNP-BC  Palliative Medicine Team/[facility]

## 2022-01-06 ENCOUNTER — Inpatient Hospital Stay (HOSPITAL_BASED_OUTPATIENT_CLINIC_OR_DEPARTMENT_OTHER): Payer: Medicaid Other | Admitting: Nurse Practitioner

## 2022-01-06 ENCOUNTER — Other Ambulatory Visit: Payer: Medicaid Other

## 2022-01-06 ENCOUNTER — Inpatient Hospital Stay: Payer: Medicaid Other

## 2022-01-06 ENCOUNTER — Encounter: Payer: Self-pay | Admitting: Nurse Practitioner

## 2022-01-06 ENCOUNTER — Inpatient Hospital Stay (HOSPITAL_BASED_OUTPATIENT_CLINIC_OR_DEPARTMENT_OTHER): Payer: Medicaid Other | Admitting: Physician Assistant

## 2022-01-06 ENCOUNTER — Other Ambulatory Visit: Payer: Self-pay

## 2022-01-06 VITALS — BP 141/62 | HR 72 | Temp 98.2°F | Resp 18 | Ht 67.5 in | Wt 210.5 lb

## 2022-01-06 VITALS — BP 144/68 | HR 60 | Resp 18

## 2022-01-06 DIAGNOSIS — C3491 Malignant neoplasm of unspecified part of right bronchus or lung: Secondary | ICD-10-CM

## 2022-01-06 DIAGNOSIS — Z5112 Encounter for antineoplastic immunotherapy: Secondary | ICD-10-CM | POA: Diagnosis not present

## 2022-01-06 DIAGNOSIS — Z5111 Encounter for antineoplastic chemotherapy: Secondary | ICD-10-CM | POA: Diagnosis not present

## 2022-01-06 DIAGNOSIS — K5900 Constipation, unspecified: Secondary | ICD-10-CM

## 2022-01-06 DIAGNOSIS — R530 Neoplastic (malignant) related fatigue: Secondary | ICD-10-CM

## 2022-01-06 DIAGNOSIS — Z95828 Presence of other vascular implants and grafts: Secondary | ICD-10-CM

## 2022-01-06 DIAGNOSIS — Z515 Encounter for palliative care: Secondary | ICD-10-CM

## 2022-01-06 DIAGNOSIS — G893 Neoplasm related pain (acute) (chronic): Secondary | ICD-10-CM

## 2022-01-06 LAB — CBC WITH DIFFERENTIAL (CANCER CENTER ONLY)
Abs Immature Granulocytes: 0.02 10*3/uL (ref 0.00–0.07)
Basophils Absolute: 0 10*3/uL (ref 0.0–0.1)
Basophils Relative: 1 %
Eosinophils Absolute: 0.1 10*3/uL (ref 0.0–0.5)
Eosinophils Relative: 2 %
HCT: 26.6 % — ABNORMAL LOW (ref 36.0–46.0)
Hemoglobin: 9.3 g/dL — ABNORMAL LOW (ref 12.0–15.0)
Immature Granulocytes: 1 %
Lymphocytes Relative: 17 %
Lymphs Abs: 0.7 10*3/uL (ref 0.7–4.0)
MCH: 34.4 pg — ABNORMAL HIGH (ref 26.0–34.0)
MCHC: 35 g/dL (ref 30.0–36.0)
MCV: 98.5 fL (ref 80.0–100.0)
Monocytes Absolute: 0.5 10*3/uL (ref 0.1–1.0)
Monocytes Relative: 11 %
Neutro Abs: 2.9 10*3/uL (ref 1.7–7.7)
Neutrophils Relative %: 68 %
Platelet Count: 244 10*3/uL (ref 150–400)
RBC: 2.7 MIL/uL — ABNORMAL LOW (ref 3.87–5.11)
RDW: 15.3 % (ref 11.5–15.5)
WBC Count: 4.2 10*3/uL (ref 4.0–10.5)
nRBC: 0 % (ref 0.0–0.2)

## 2022-01-06 LAB — CMP (CANCER CENTER ONLY)
ALT: 31 U/L (ref 0–44)
AST: 22 U/L (ref 15–41)
Albumin: 3.7 g/dL (ref 3.5–5.0)
Alkaline Phosphatase: 61 U/L (ref 38–126)
Anion gap: 6 (ref 5–15)
BUN: 17 mg/dL (ref 6–20)
CO2: 24 mmol/L (ref 22–32)
Calcium: 9 mg/dL (ref 8.9–10.3)
Chloride: 109 mmol/L (ref 98–111)
Creatinine: 0.76 mg/dL (ref 0.44–1.00)
GFR, Estimated: >60 mL/min (ref >60)
Glucose, Bld: 175 mg/dL — ABNORMAL HIGH (ref 70–99)
Potassium: 3.4 mmol/L — ABNORMAL LOW (ref 3.5–5.1)
Sodium: 139 mmol/L (ref 135–145)
Total Bilirubin: 0.5 mg/dL (ref 0.3–1.2)
Total Protein: 6.4 g/dL — ABNORMAL LOW (ref 6.5–8.1)

## 2022-01-06 MED ORDER — SODIUM CHLORIDE 0.9% FLUSH
10.0000 mL | INTRAVENOUS | Status: AC | PRN
  Administered 2022-01-06: 10 mL

## 2022-01-06 MED ORDER — PROCHLORPERAZINE MALEATE 10 MG PO TABS
10.0000 mg | ORAL_TABLET | Freq: Once | ORAL | Status: AC
  Administered 2022-01-06: 10 mg via ORAL
  Filled 2022-01-06: qty 1

## 2022-01-06 MED ORDER — SODIUM CHLORIDE 0.9% FLUSH
10.0000 mL | INTRAVENOUS | Status: DC | PRN
  Administered 2022-01-06: 10 mL

## 2022-01-06 MED ORDER — OXYCODONE HCL 5 MG PO TABS: 5.0000 mg | ORAL_TABLET | Freq: Four times a day (QID) | ORAL | 0 refills | Status: DC | PRN

## 2022-01-06 MED ORDER — HEPARIN SOD (PORK) LOCK FLUSH 100 UNIT/ML IV SOLN
500.0000 [IU] | Freq: Once | INTRAVENOUS | Status: AC | PRN
  Administered 2022-01-06: 500 [IU]

## 2022-01-06 MED ORDER — XTAMPZA ER 9 MG PO C12A: 9.0000 mg | EXTENDED_RELEASE_CAPSULE | Freq: Two times a day (BID) | ORAL | 0 refills | Status: DC

## 2022-01-06 MED ORDER — SODIUM CHLORIDE 0.9 % IV SOLN
200.0000 mg | Freq: Once | INTRAVENOUS | Status: AC
  Administered 2022-01-06: 200 mg via INTRAVENOUS
  Filled 2022-01-06: qty 8

## 2022-01-06 MED ORDER — SODIUM CHLORIDE 0.9 % IV SOLN
500.0000 mg/m2 | Freq: Once | INTRAVENOUS | Status: AC
  Administered 2022-01-06: 1100 mg via INTRAVENOUS
  Filled 2022-01-06: qty 40

## 2022-01-06 MED ORDER — SODIUM CHLORIDE 0.9 % IV SOLN: Freq: Once | INTRAVENOUS | Status: AC

## 2022-01-06 NOTE — Patient Instructions (Signed)
[facility]   Discharge Instructions: Thank you for choosing [facility] to provide your oncology and hematology care.   If you have a lab appointment with the [facility], please go directly to the [facility] and check in at the registration area.   Wear comfortable clothing and clothing appropriate for easy access to any Portacath or PICC line.   We strive to give you quality time with your provider. You may need to reschedule your appointment if you arrive late (15 or more minutes).  Arriving late affects you and other patients whose appointments are after yours.  Also, if you miss three or more appointments without notifying the office, you may be dismissed from the clinic at the provider's discretion.      For prescription refill requests, have your pharmacy contact our office and allow 72 hours for refills to be completed.    Today you received the following chemotherapy and/or immunotherapy agents: Pembrolizumab (Keytruda) and Pemetrexed (Alimta)      To help prevent nausea and vomiting after your treatment, we encourage you to take your nausea medication as directed.  BELOW ARE SYMPTOMS THAT SHOULD BE REPORTED IMMEDIATELY: *FEVER GREATER THAN 100.4 F (38 C) OR HIGHER *CHILLS OR SWEATING *NAUSEA AND VOMITING THAT IS NOT CONTROLLED WITH YOUR NAUSEA MEDICATION *UNUSUAL SHORTNESS OF BREATH *UNUSUAL BRUISING OR BLEEDING *URINARY PROBLEMS (pain or burning when urinating, or frequent urination) *BOWEL PROBLEMS (unusual diarrhea, constipation, pain near the anus) TENDERNESS IN MOUTH AND THROAT WITH OR WITHOUT PRESENCE OF ULCERS (sore throat, sores in mouth, or a toothache) UNUSUAL RASH, SWELLING OR PAIN  UNUSUAL VAGINAL DISCHARGE OR ITCHING   Items with * indicate a potential emergency and should be followed up as soon as possible or go to the Emergency Department if any problems should occur.  Please show the CHEMOTHERAPY ALERT CARD or  IMMUNOTHERAPY ALERT CARD at check-in to the Emergency Department and triage nurse.  Should you have questions after your visit or need to cancel or reschedule your appointment, please contact [facility]  Dept: [phone]  and follow the prompts.  Office hours are 8:00 a.m. to 4:30 p.m. Monday - Friday. Please note that voicemails left after 4:00 p.m. may not be returned until the following business day.  We are closed weekends and major holidays. You have access to a nurse at all times for urgent questions. Please call the main number to the clinic Dept: [phone] and follow the prompts.   For any non-urgent questions, you may also contact your provider using MyChart. We now offer e-Visits for anyone 80 and older to request care online for non-urgent symptoms. For details visit [email or web address].   Also download the MyChart app! Go to the app store, search "MyChart", open the app, select [facility], and log in with your MyChart username and password.

## 2022-01-08 ENCOUNTER — Other Ambulatory Visit: Payer: Self-pay | Admitting: Family Medicine

## 2022-01-08 ENCOUNTER — Other Ambulatory Visit: Payer: Self-pay | Admitting: Nurse Practitioner

## 2022-01-08 ENCOUNTER — Telehealth: Payer: Self-pay | Admitting: Internal Medicine

## 2022-01-08 DIAGNOSIS — R110 Nausea: Secondary | ICD-10-CM

## 2022-01-08 DIAGNOSIS — C3491 Malignant neoplasm of unspecified part of right bronchus or lung: Secondary | ICD-10-CM

## 2022-01-08 DIAGNOSIS — Z515 Encounter for palliative care: Secondary | ICD-10-CM

## 2022-01-08 NOTE — Telephone Encounter (Signed)
Called patient regarding all upcoming appointments, patient is notified.

## 2022-01-13 ENCOUNTER — Inpatient Hospital Stay: Payer: Medicaid Other | Attending: Internal Medicine | Admitting: Physician Assistant

## 2022-01-13 ENCOUNTER — Other Ambulatory Visit: Payer: Self-pay

## 2022-01-13 ENCOUNTER — Other Ambulatory Visit: Payer: Medicaid Other

## 2022-01-13 ENCOUNTER — Inpatient Hospital Stay: Payer: Medicaid Other

## 2022-01-13 ENCOUNTER — Other Ambulatory Visit: Payer: Self-pay | Admitting: Internal Medicine

## 2022-01-13 ENCOUNTER — Telehealth: Payer: Self-pay | Admitting: Medical Oncology

## 2022-01-13 ENCOUNTER — Telehealth: Payer: Self-pay

## 2022-01-13 VITALS — BP 144/82 | HR 78 | Resp 16

## 2022-01-13 VITALS — BP 132/86 | HR 88 | Temp 98.8°F | Resp 18 | Wt 206.9 lb

## 2022-01-13 DIAGNOSIS — Z923 Personal history of irradiation: Secondary | ICD-10-CM | POA: Diagnosis not present

## 2022-01-13 DIAGNOSIS — C3411 Malignant neoplasm of upper lobe, right bronchus or lung: Secondary | ICD-10-CM | POA: Diagnosis present

## 2022-01-13 DIAGNOSIS — D61810 Antineoplastic chemotherapy induced pancytopenia: Secondary | ICD-10-CM | POA: Insufficient documentation

## 2022-01-13 DIAGNOSIS — Z8349 Family history of other endocrine, nutritional and metabolic diseases: Secondary | ICD-10-CM | POA: Insufficient documentation

## 2022-01-13 DIAGNOSIS — J439 Emphysema, unspecified: Secondary | ICD-10-CM | POA: Insufficient documentation

## 2022-01-13 DIAGNOSIS — R110 Nausea: Secondary | ICD-10-CM | POA: Insufficient documentation

## 2022-01-13 DIAGNOSIS — Z5111 Encounter for antineoplastic chemotherapy: Secondary | ICD-10-CM | POA: Insufficient documentation

## 2022-01-13 DIAGNOSIS — Z880 Allergy status to penicillin: Secondary | ICD-10-CM | POA: Diagnosis not present

## 2022-01-13 DIAGNOSIS — C7972 Secondary malignant neoplasm of left adrenal gland: Secondary | ICD-10-CM | POA: Diagnosis not present

## 2022-01-13 DIAGNOSIS — C787 Secondary malignant neoplasm of liver and intrahepatic bile duct: Secondary | ICD-10-CM | POA: Diagnosis not present

## 2022-01-13 DIAGNOSIS — C7971 Secondary malignant neoplasm of right adrenal gland: Secondary | ICD-10-CM | POA: Diagnosis not present

## 2022-01-13 DIAGNOSIS — Z79899 Other long term (current) drug therapy: Secondary | ICD-10-CM | POA: Insufficient documentation

## 2022-01-13 DIAGNOSIS — Z888 Allergy status to other drugs, medicaments and biological substances status: Secondary | ICD-10-CM | POA: Insufficient documentation

## 2022-01-13 DIAGNOSIS — Z515 Encounter for palliative care: Secondary | ICD-10-CM | POA: Insufficient documentation

## 2022-01-13 DIAGNOSIS — Z7951 Long term (current) use of inhaled steroids: Secondary | ICD-10-CM | POA: Diagnosis not present

## 2022-01-13 DIAGNOSIS — Z5112 Encounter for antineoplastic immunotherapy: Secondary | ICD-10-CM | POA: Diagnosis present

## 2022-01-13 DIAGNOSIS — Z95828 Presence of other vascular implants and grafts: Secondary | ICD-10-CM

## 2022-01-13 DIAGNOSIS — I252 Old myocardial infarction: Secondary | ICD-10-CM | POA: Diagnosis not present

## 2022-01-13 DIAGNOSIS — Z8379 Family history of other diseases of the digestive system: Secondary | ICD-10-CM | POA: Insufficient documentation

## 2022-01-13 DIAGNOSIS — R519 Headache, unspecified: Secondary | ICD-10-CM | POA: Diagnosis not present

## 2022-01-13 DIAGNOSIS — E119 Type 2 diabetes mellitus without complications: Secondary | ICD-10-CM | POA: Diagnosis not present

## 2022-01-13 DIAGNOSIS — J449 Chronic obstructive pulmonary disease, unspecified: Secondary | ICD-10-CM | POA: Insufficient documentation

## 2022-01-13 DIAGNOSIS — G893 Neoplasm related pain (acute) (chronic): Secondary | ICD-10-CM | POA: Diagnosis not present

## 2022-01-13 DIAGNOSIS — I10 Essential (primary) hypertension: Secondary | ICD-10-CM | POA: Insufficient documentation

## 2022-01-13 DIAGNOSIS — C7951 Secondary malignant neoplasm of bone: Secondary | ICD-10-CM | POA: Diagnosis not present

## 2022-01-13 DIAGNOSIS — Z808 Family history of malignant neoplasm of other organs or systems: Secondary | ICD-10-CM | POA: Insufficient documentation

## 2022-01-13 DIAGNOSIS — K5900 Constipation, unspecified: Secondary | ICD-10-CM | POA: Diagnosis not present

## 2022-01-13 DIAGNOSIS — Z803 Family history of malignant neoplasm of breast: Secondary | ICD-10-CM | POA: Insufficient documentation

## 2022-01-13 DIAGNOSIS — I2510 Atherosclerotic heart disease of native coronary artery without angina pectoris: Secondary | ICD-10-CM | POA: Diagnosis not present

## 2022-01-13 DIAGNOSIS — Z5986 Financial insecurity: Secondary | ICD-10-CM | POA: Diagnosis not present

## 2022-01-13 DIAGNOSIS — Z800 Family history of malignant neoplasm of digestive organs: Secondary | ICD-10-CM | POA: Insufficient documentation

## 2022-01-13 DIAGNOSIS — C3491 Malignant neoplasm of unspecified part of right bronchus or lung: Secondary | ICD-10-CM

## 2022-01-13 DIAGNOSIS — Z833 Family history of diabetes mellitus: Secondary | ICD-10-CM | POA: Insufficient documentation

## 2022-01-13 DIAGNOSIS — Z8249 Family history of ischemic heart disease and other diseases of the circulatory system: Secondary | ICD-10-CM | POA: Insufficient documentation

## 2022-01-13 LAB — CBC WITH DIFFERENTIAL (CANCER CENTER ONLY)
Abs Immature Granulocytes: 0.02 10*3/uL (ref 0.00–0.07)
Basophils Absolute: 0 10*3/uL (ref 0.0–0.1)
Basophils Relative: 1 %
Eosinophils Absolute: 0 10*3/uL (ref 0.0–0.5)
Eosinophils Relative: 1 %
HCT: 27.8 % — ABNORMAL LOW (ref 36.0–46.0)
Hemoglobin: 10 g/dL — ABNORMAL LOW (ref 12.0–15.0)
Immature Granulocytes: 1 %
Lymphocytes Relative: 27 %
Lymphs Abs: 0.5 10*3/uL — ABNORMAL LOW (ref 0.7–4.0)
MCH: 34.4 pg — ABNORMAL HIGH (ref 26.0–34.0)
MCHC: 36 g/dL (ref 30.0–36.0)
MCV: 95.5 fL (ref 80.0–100.0)
Monocytes Absolute: 0.2 10*3/uL (ref 0.1–1.0)
Monocytes Relative: 12 %
Neutro Abs: 1.1 10*3/uL — ABNORMAL LOW (ref 1.7–7.7)
Neutrophils Relative %: 58 %
Platelet Count: 157 10*3/uL (ref 150–400)
RBC: 2.91 MIL/uL — ABNORMAL LOW (ref 3.87–5.11)
RDW: 13.4 % (ref 11.5–15.5)
WBC Count: 1.9 10*3/uL — ABNORMAL LOW (ref 4.0–10.5)
nRBC: 0 % (ref 0.0–0.2)

## 2022-01-13 LAB — URINALYSIS, COMPLETE (UACMP) WITH MICROSCOPIC
Bacteria, UA: NONE SEEN
Bilirubin Urine: NEGATIVE
Glucose, UA: NEGATIVE mg/dL
Hgb urine dipstick: NEGATIVE
Ketones, ur: NEGATIVE mg/dL
Leukocytes,Ua: NEGATIVE
Nitrite: NEGATIVE
Protein, ur: NEGATIVE mg/dL
Specific Gravity, Urine: 1.012 (ref 1.005–1.030)
pH: 5 (ref 5.0–8.0)

## 2022-01-13 LAB — CMP (CANCER CENTER ONLY)
ALT: 66 U/L — ABNORMAL HIGH (ref 0–44)
AST: 46 U/L — ABNORMAL HIGH (ref 15–41)
Albumin: 4 g/dL (ref 3.5–5.0)
Alkaline Phosphatase: 72 U/L (ref 38–126)
Anion gap: 9 (ref 5–15)
BUN: 11 mg/dL (ref 6–20)
CO2: 24 mmol/L (ref 22–32)
Calcium: 8.9 mg/dL (ref 8.9–10.3)
Chloride: 102 mmol/L (ref 98–111)
Creatinine: 0.79 mg/dL (ref 0.44–1.00)
GFR, Estimated: >60 mL/min (ref >60)
Glucose, Bld: 157 mg/dL — ABNORMAL HIGH (ref 70–99)
Potassium: 3.6 mmol/L (ref 3.5–5.1)
Sodium: 135 mmol/L (ref 135–145)
Total Bilirubin: 0.8 mg/dL (ref 0.3–1.2)
Total Protein: 7.9 g/dL (ref 6.5–8.1)

## 2022-01-13 MED ORDER — SODIUM CHLORIDE 0.9 % IV SOLN: Freq: Once | INTRAVENOUS | Status: AC

## 2022-01-13 MED ORDER — DOXYCYCLINE HYCLATE 100 MG PO TABS: 100.0000 mg | ORAL_TABLET | Freq: Two times a day (BID) | ORAL | 0 refills | Status: AC

## 2022-01-13 MED ORDER — ONDANSETRON HCL 4 MG/2ML IJ SOLN
4.0000 mg | Freq: Once | INTRAMUSCULAR | Status: AC
  Administered 2022-01-13: 4 mg via INTRAVENOUS
  Filled 2022-01-13: qty 2

## 2022-01-13 MED ORDER — FAMOTIDINE IN NACL 20-0.9 MG/50ML-% IV SOLN
20.0000 mg | Freq: Once | INTRAVENOUS | Status: AC
  Administered 2022-01-13: 20 mg via INTRAVENOUS
  Filled 2022-01-13: qty 50

## 2022-01-13 NOTE — Telephone Encounter (Signed)
Cassidy [name], counseling intern, called the patient to schedule a counseling session.   The patient reported they have not been feeling well and they have been experiencing oozing from their port. The patient reported they were on their way to be examined by the doctor.   The patient scheduled a counseling session for 1/11 at [place],  Counseling Intern  [phone] [email or web address]

## 2022-01-13 NOTE — Patient Instructions (Signed)

## 2022-01-13 NOTE — Progress Notes (Signed)
Symptom Management Consult note [place]    Patient Care Team: [name], DO as PCP - General (Family Medicine) O'Neal, Cassie Freer, MD as PCP - Cardiology (Cardiology) Rolm Bookbinder, MD as Consulting Physician (General Surgery)    Name of the patient: Cassidy [name]  [id number]  1965/05/25   Date of visit: 01/13/2022   Chief Complaint/Reason for visit: nausea, headache   Current Therapy: Keytruda and alimta  Last treatment:  Day 1   Cycle 7 on 01/06/22   ASSESSMENT & PLAN: Patient is a 57 y.o. female  with oncologic history of Stage IV (T2a, N2, M1c) non-small cell lung cancer, adenocarcinoma  followed by Dr. Julien Nordmann.  I have viewed most recent oncology note and lab work.    #) Stage IV (T2a, N2, M1c) non-small cell lung cancer, adenocarcinoma  - Has MR brain scheduled 01/18/22 and CT CAP 01/25/21 for restaging. -Next appointment with oncologist is 01/27/21   #) Symptom management: Nausea, abdominal pain, ?port infection -Abdominal exam is benign. She is experiencing her typical abdominal pain. She is having small, hard BM, suspect constipation is contributing to pain. Discussed aggressive OTC management. -CBC with pancytopenia 2/2 chemotherapy. WBC 1.9, HgB 10.0, ANC 1.1.  CMP without significant electrolyte derangement. LFTs are slightly increased, will need to be trended at upcoming appointment to watch for immunotherapy toxicity.  -UA is without signs of infection. -Port with small area of erythema, appears that internal suture irritated skin and caused small dehiscence. Will treat with doxycyline to cover for developing cellulitis as she is immunocompromised.   Strict ED precautions discussed should symptoms worsen.    Heme/Onc History: Oncology History  Adenocarcinoma of right lung, stage 4 ([place])  08/26/2021 Initial Diagnosis   Adenocarcinoma of right lung, stage 4 ([place])   09/02/2021 -  Chemotherapy   Patient is on Treatment  Plan : LUNG Carboplatin (5) + Pemetrexed (500) + Pembrolizumab (200) D1 q21d Induction x 4 cycles / Maintenance Pemetrexed (500) + Pembrolizumab (200) D1 q21d     09/02/2021 Cancer Staging   Staging form: Lung, AJCC 8th Edition - Clinical: Stage IVB (cT2a, cN2, cM1c) - Signed by Curt Bears, MD on 09/02/2021       Interval history-: Cassidy [name] is a 57 y.o. female with oncologic history as above presenting to [facility] today with chief complaint of abdominal pain, nausea and concern for port infection. She presents unaccompanied today to clinic.  Patient states she has had intermittent abdominal pain x 3 days.  Pain is located over her abdomen and is sharp and cramping in nature.  She states the pain started after she had multiple episodes of dry heaving.  She rates the pain currently 4 out of 10 in severity.  She is also having constant nausea.  She has not had any of her home medications yet today and does not feel that Zofran and Compazine had helped the nausea yesterday.  She is able to tolerate p.o. intake with water and admits that her appetite is overall decreased.  She wants to get checked for a UTI because in the past nausea has been a symptom of 1 for her.  She has not had a UTI in several years but was prone to them as a child.  She denies any pelvic pain or vaginal discharge.  She is not sexually active.  She denies any fevers or chills.  Patient states x 2 days ago she noticed a bump over her port incision.  While she was washing the bump it opened and there was white drainage. She also sleep on her left side and thinks that might have caused the port to become sore. She reports tenderness and some redness around the port currently.       ROS  All other systems are reviewed and are negative for acute change except as noted in the HPI.    Allergies  Allergen Reactions   Amoxil [Amoxicillin] Hives    Has patient had a PCN reaction causing immediate rash, facial/tongue/throat  swelling, SOB or lightheadedness with hypotension: Yes Has patient had a PCN reaction causing severe rash involving mucus membranes or skin necrosis: No Has patient had a PCN reaction that required hospitalization No Has patient had a PCN reaction occurring within the last 10 years: Yes If all of the above answers are "NO", then may proceed with Cephalosporin use.   Chantix [Varenicline] Other (See Comments)    Nightmares Hallucinations     Suboxone [Buprenorphine Hcl-Naloxone Hcl] Nausea And Vomiting   Emend [Fosaprepitant Dimeglumine] Itching    See notes for transfusion on 8/23. Pt reported new onset of itching after Emend infusion was started. 25mg  Benadryl IV given per MD. Pt tolerated remaining infusion well.      Past Medical History:  Diagnosis Date   Adenocarcinoma of right lung, stage 4 ([place]) 08/26/2021   Anemia    has sickle cell trait   Atherosclerotic heart disease of native coronary artery with angina pectoris ([place]) 05/2012, 10/2013   a.  s/p PCTA to dRCA and ostial RPAV-PLA vessel + PDA branch (05/2012)  b. [place] s/p DES to mLAD - Resolute DES 3.0 x 22 (3.69mm -->3.3 mm)   Bipolar depression ([place])    Breast abscess    a. right side.    COPD (chronic obstructive pulmonary disease) (HCC)    Depression    Diabetic peripheral neuropathy (HCC)    Fibromyalgia    Genital warts    GERD (gastroesophageal reflux disease)    History of hiatal hernia    HLD (hyperlipidemia)    Hypertension    Pain in limb    a. LE VENOUS DUPLEX, 02/05/2009 - no evidence of deep vein thrombosis, Baker's cyst   Pneumonia    PTSD (post-traumatic stress disorder)    Sickle cell trait ([place])    Tobacco abuse    Tooth caries    Type II diabetes mellitus ([place])      Past Surgical History:  Procedure Laterality Date   BLADDER SURGERY  1980   "TVT"   BLADDER SURGERY  2003   "TVT"   BREAST EXCISIONAL BIOPSY     BREAST SURGERY Right    I&D for multiple abscesses   CARDIAC CATHETERIZATION      CORONARY ARTERY BYPASS GRAFT N/A 06/30/2021   Procedure: CORONARY ARTERY BYPASS GRAFTING (CABG) x3 USING LEFT INTERNAL MAMMARY ARTERY,  RIGHT RADIAL ARTERY AND LEFT GREATER SAPHENOUS VEIN;  Surgeon: Lajuana Matte, MD;  Location: [place];  Service: Open Heart Surgery;  Laterality: N/A;   cutting balloon     ENDOVEIN HARVEST OF GREATER SAPHENOUS VEIN Left 06/30/2021   Procedure: ENDOVEIN HARVEST OF GREATER SAPHENOUS VEIN;  Surgeon: Lajuana Matte, MD;  Location: [place];  Service: Open Heart Surgery;  Laterality: Left;   EYE SURGERY Left    laser surgery   FINE NEEDLE ASPIRATION  08/21/2021   Procedure: FINE NEEDLE ASPIRATION (FNA) LINEAR;  Surgeon: Garner Nash, DO;  Location: [place];  Service: Pulmonary;;   INCISE AND DRAIN ABCESS  ; 04/26/11; 08/13/11   right breast   INCISION AND DRAINAGE ABSCESS Right 05/09/2012   Procedure: INCISION AND DRAINAGE RIGHT BREAST ABSCESS;  Surgeon: Imogene Burn. Georgette Dover, MD;  Location: [place];  Service: General;  Laterality: Right;   INCISION AND DRAINAGE ABSCESS Right 02/08/2014   Procedure: INCISION AND DRAINAGE RIGHT BREAST ABSCESS;  Surgeon: Donnie Mesa, MD;  Location: [place];  Service: General;  Laterality: Right;   INCISION AND DRAINAGE ABSCESS Right 06/14/2014   Procedure: INCISION AND DRAINAGE RIGHT BREAST ABSCESS;  Surgeon: Donnie Mesa, MD;  Location: [place];  Service: General;  Laterality: Right;   IR IMAGING GUIDED PORT INSERTION  12/08/2021   IRRIGATION AND DEBRIDEMENT ABSCESS  12/19/2010   Procedure: IRRIGATION AND DEBRIDEMENT ABSCESS;  Surgeon: Rolm Bookbinder, MD;  Location: [place];  Service: General;  Laterality: Right;   IRRIGATION AND DEBRIDEMENT ABSCESS  08/13/2011   Procedure: MINOR INCISION AND DRAINAGE OF ABSCESS;  Surgeon: Gwenyth Ober, MD;  Location: [place];  Service: General;  Laterality: Right;  Right Breast    IRRIGATION AND DEBRIDEMENT ABSCESS  11/17/2011   Procedure: IRRIGATION AND DEBRIDEMENT ABSCESS;  Surgeon: Imogene Burn. Georgette Dover, MD;  Location: [place];  Service: General;  Laterality: Right;  irrigation and debridement right recurrent breast abscess   LARYNX SURGERY     LEFT HEART CATH AND CORONARY ANGIOGRAPHY N/A 04/09/2021   Procedure: LEFT HEART CATH AND CORONARY ANGIOGRAPHY;  Surgeon: Jettie Booze, MD;  Location: [place] [facility];  Service: Cardiovascular;  Laterality: N/A;   LEFT HEART CATHETERIZATION WITH CORONARY ANGIOGRAM N/A 05/29/2012   Procedure: LEFT HEART CATHETERIZATION WITH CORONARY ANGIOGRAM & PTCA;  Surgeon: Troy Sine, MD;  Location: [facility] CATH LAB;  Service: Cardiovascular; Bifurcation dRCA-RPAD/PLA & rPDA  PTCA.   LEFT HEART CATHETERIZATION WITH CORONARY ANGIOGRAM N/A 11/08/2013   Procedure: LEFT HEART CATHETERIZATION WITH CORONARY ANGIOGRAM and Coronary Stent Intervention;  Surgeon: Leonie Man, MD;  Location: [facility] CATH LAB;  Service: Cardiovascular; mLAD 80% (FFR 0.73) - resolute DES 3.0 x 22 mm (postdilated to 3.3 mm->3.5 mm)   NM MYOVIEW LTD  01/26/2009   Normal study, no evidence of ischemia, EF 67%   ployp removed from voice box 03/30/12     RADIAL ARTERY HARVEST Right 06/30/2021   Procedure: RADIAL ARTERY HARVEST;  Surgeon: Lajuana Matte, MD;  Location: [place];  Service: Open Heart Surgery;  Laterality: Right;   REFRACTIVE SURGERY  ~ 2010   right   TEE WITHOUT CARDIOVERSION N/A 06/30/2021   Procedure: TRANSESOPHAGEAL ECHOCARDIOGRAM (TEE);  Surgeon: Lajuana Matte, MD;  Location: [place];  Service: Open Heart Surgery;  Laterality: N/A;   TONSILLECTOMY  1988   VIDEO BRONCHOSCOPY WITH ENDOBRONCHIAL ULTRASOUND Bilateral 08/21/2021   Procedure: VIDEO BRONCHOSCOPY WITH ENDOBRONCHIAL ULTRASOUND;  Surgeon: Garner Nash, DO;  Location: [place];  Service: Pulmonary;  Laterality: Bilateral;    Social History   Socioeconomic History   Marital status: Single    Spouse name: Not on file   Number of children: 1   Years of education: Not on file   Highest education  level: Not on file  Occupational History   Occupation: homemaker   Occupation: disabled  Tobacco Use   Smoking status: Every Day    Packs/day: 0.50    Years: 42.00    Total pack years: 21.00    Types: Cigarettes    Start date: 01/12/1979   Smokeless tobacco:  Never   Tobacco comments:    08/14/11 "quit for 3 wk 05/2011; was smoking 1 ppd since age 14 before I quit; at least I've cut down to 1/4 ppd",  Smoking 3 per day 08/20/2021. 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, marijuana, crack cocaine)   Drug use: Not Currently    Comment: 08/14/11 'quit everything in 2005"   Sexual activity: Yes    Partners: Male    Birth control/protection: Condom  Other Topics Concern   Not on file  Social History Narrative   Work: disability   Children: son, 22 yrs old   Regular exercise: some/ heart attack in May/ walks 1 mile 3 days a week   Caffeine use: daily, cup of coffee daily   Social Determinants of Health   Financial Resource Strain: High Risk (09/08/2021)   Overall Financial Resource Strain (CARDIA)    Difficulty of Paying Living Expenses: Very hard  Food Insecurity: Not on file  Transportation Needs: Not on file  Physical Activity: Not on file  Stress: Not on file  Social Connections: Not on file  Intimate Partner Violence: Not At Risk (09/29/2021)   Humiliation, Afraid, Rape, and Kick questionnaire    Fear of Current or Ex-Partner: No    Emotionally Abused: No    Physically Abused: No    Sexually Abused: No    Family History  Problem Relation Age of Onset   Esophageal cancer Mother    COPD Mother    Cancer Mother        lymphoma   Heart disease Mother    Stomach cancer Mother    Hyperlipidemia Father    Heart disease Father    Cancer Maternal Aunt        breast, colon   Crohn's disease Maternal Aunt    Hypertension  Maternal Grandmother    Diabetes Maternal Grandmother    Hypertension Brother    Rectal cancer Neg Hx    Liver cancer Neg Hx    Colon cancer Neg Hx    Breast cancer Neg Hx      Current Outpatient Medications:    doxycycline (VIBRA-TABS) 100 MG tablet, Take 1 tablet (100 mg total) by mouth 2 (two) times daily for 7 days., Disp: 14 tablet, Rfl: 0   aspirin EC 81 MG tablet, Take 81 mg by mouth daily., Disp: , Rfl:    atorvastatin (LIPITOR) 40 MG tablet, TAKE 1 TABLET (40 MG TOTAL) BY MOUTH DAILY., Disp: 90 tablet, Rfl: 3   busPIRone (BUSPAR) 15 MG tablet, TAKE 1/2 TABLET (7.5 MG TOTAL) BY MOUTH TWICE DAILY (Patient taking differently: Take 7.5 mg by mouth 2 (two) times daily.), Disp: 30 tablet, Rfl: 11   Carboxymethylcellul-Glycerin (LUBRICATING EYE DROPS OP), Place 1 drop into both eyes daily as needed (dry eyes)., Disp: , Rfl:    carvedilol (COREG) 12.5 MG tablet, TAKE 1 TABLET (12.5 MG TOTAL) BY MOUTH 2 (TWO) TIMES DAILY WITH A MEAL., Disp: 180 tablet, Rfl: 1   diclofenac Sodium (VOLTAREN) 1 % GEL, Apply 1 application topically 3 times daily as needed for pain, Disp: 2 g, Rfl: 3   ferrous sulfate 325 (65 FE) MG tablet, Take 325 mg by mouth daily with breakfast., Disp: , Rfl:    fluticasone (FLONASE) 50 MCG/ACT nasal spray, PLACE 1 SPRAY INTO BOTH  NOSTRILS DAILY AS NEEDED (CONGESTION)., Disp: 16 g, Rfl: 1   fluticasone-salmeterol (ADVAIR) 100-50 MCG/ACT AEPB, Inhale 1 puff into the lungs 2 (two) times daily., Disp: 1 each, Rfl: 3   folic acid (FOLVITE) 1 MG tablet, TAKE 1 TABLET (1 MG TOTAL) BY MOUTH DAILY., Disp: 30 tablet, Rfl: 3   furosemide (LASIX) 40 MG tablet, Take 1 tablet (40 mg total) by mouth daily as needed. If you gain 3 lbs in 24 hours or 5 lbs in 1 week, Disp: 30 tablet, Rfl: 1   glucose blood (ACCU-CHEK AVIVA PLUS) test strip, Use as instructed, Disp: 50 each, Rfl: 1   insulin detemir (LEVEMIR FLEXPEN) 100 UNIT/ML FlexPen, Inject 15 Units into the skin 2 (two) times daily.,  Disp: 15 mL, Rfl: 1   Insulin Pen Needle 32G X 4 MM MISC, 1 Needle by Does not apply route in the morning and at bedtime., Disp: 120 each, Rfl: 2   ipratropium (ATROVENT HFA) 17 MCG/ACT inhaler, Inhale 2 puffs into the lungs every 4 (four) hours as needed for wheezing (COPD)., Disp: , Rfl:    lidocaine-prilocaine (EMLA) cream, Apply 1 Application topically as needed., Disp: 30 g, Rfl: 2   nicotine polacrilex (NICORETTE) 2 MG gum, Take 1 each (2 mg total) by mouth as needed for smoking cessation., Disp: 100 tablet, Rfl: 0   nitroGLYCERIN (NITROSTAT) 0.4 MG SL tablet, Place 1 tablet (0.4 mg total) under the tongue every 5 (five) minutes as needed for chest pain., Disp: 25 tablet, Rfl: 5   nystatin (MYCOSTATIN) 100000 UNIT/ML suspension, Take 5 mLs (500,000 Units total) by mouth 4 (four) times daily., Disp: 60 mL, Rfl: 0   ondansetron (ZOFRAN) 8 MG tablet, TAKE 1 TABLET (8 MG TOTAL) BY MOUTH EVERY 8 (EIGHT) HOURS AS NEEDED FOR NAUSEA OR VOMITING., Disp: 30 tablet, Rfl: 1   oxyCODONE (OXY IR/ROXICODONE) 5 MG immediate release tablet, Take 1-2 tablets (5-10 mg total) by mouth every 6 (six) hours as needed for severe pain., Disp: 90 tablet, Rfl: 0   oxyCODONE ER (XTAMPZA ER) 9 MG C12A, Take 9 mg by mouth every 12 (twelve) hours., Disp: 60 capsule, Rfl: 0   pantoprazole (PROTONIX) 40 MG tablet, TAKE 1 TABLET (40 MG TOTAL) BY MOUTH DAILY., Disp: 30 tablet, Rfl: 1   polyethylene glycol powder (GLYCOLAX/MIRALAX) 17 GM/SCOOP powder, Take 17 g by mouth daily as needed for mild constipation or moderate constipation., Disp: 3350 g, Rfl: 1   potassium chloride SA (KLOR-CON M) 20 MEQ tablet, Take 1 tablet (20 mEq total) by mouth daily as needed. On days you take Lasix, Disp: 30 tablet, Rfl: 1   prochlorperazine (COMPAZINE) 10 MG tablet, Take 1 tablet (10 mg total) by mouth every 6 (six) hours as needed for nausea or vomiting., Disp: 30 tablet, Rfl: 0   sertraline (ZOLOFT) 50 MG tablet, TAKE 1 TABLET (50 MG TOTAL) BY  MOUTH DAILY., Disp: 30 tablet, Rfl: 3   valACYclovir (VALTREX) 1000 MG tablet, TAKE FOR 3 DAYS AS NEEDED FOR EACH OUTBREAK, Disp: 30 tablet, Rfl: 11   vitamin B-12 (CYANOCOBALAMIN) 1000 MCG tablet, Take 1,000 mcg by mouth daily., Disp: , Rfl:   PHYSICAL EXAM: ECOG FS:1 - Symptomatic but completely ambulatory    Vitals:   01/13/22 1328  BP: 132/86  Pulse: 88  Resp: 18  Temp: 98.8 F (37.1 C)  TempSrc: Oral  SpO2: 100%  Weight: 206 lb 14.4 oz (93.8 kg)   Physical Exam Vitals and nursing note reviewed.  Constitutional:  Appearance: She is well-developed. She is not ill-appearing or toxic-appearing.  HENT:     Head: Normocephalic.     Nose: Nose normal.  Eyes:     Conjunctiva/sclera: Conjunctivae normal.  Neck:     Vascular: No JVD.  Cardiovascular:     Rate and Rhythm: Normal rate and regular rhythm.     Pulses: Normal pulses.     Heart sounds: Normal heart sounds.  Pulmonary:     Effort: Pulmonary effort is normal.     Breath sounds: Normal breath sounds.  Chest:     Comments: Port with <1cm of dehiscence over incision. Faint surrounding erythema with tenderness to the touch. No warmth. No drainage. Abdominal:     General: Bowel sounds are normal. There is no distension.     Palpations: Abdomen is soft. There is no mass.     Tenderness: There is no abdominal tenderness. There is no guarding or rebound.     Hernia: No hernia is present.  Musculoskeletal:     Cervical back: Normal range of motion.  Skin:    General: Skin is warm and dry.  Neurological:     Mental Status: She is oriented to person, place, and time.        LABORATORY DATA: I have reviewed the data as listed    Latest Ref Rng & Units 01/13/2022    2:00 PM 01/06/2022    2:50 PM 12/16/2021   11:42 AM  CBC  WBC 4.0 - 10.5 K/uL 1.9  4.2  4.8   Hemoglobin 12.0 - 15.0 g/dL 10.0  9.3  9.5   Hematocrit 36.0 - 46.0 % 27.8  26.6  28.4   Platelets 150 - 400 K/uL 157  244  276         Latest Ref  Rng & Units 01/13/2022    2:00 PM 01/06/2022    2:50 PM 12/16/2021   11:42 AM  CMP  Glucose 70 - 99 mg/dL 157  175  175   BUN 6 - 20 mg/dL 11  17  10    Creatinine 0.44 - 1.00 mg/dL 0.79  0.76  0.79   Sodium 135 - 145 mmol/L 135  139  138   Potassium 3.5 - 5.1 mmol/L 3.6  3.4  4.1   Chloride 98 - 111 mmol/L 102  109  106   CO2 22 - 32 mmol/L 24  24  27    Calcium 8.9 - 10.3 mg/dL 8.9  9.0  9.3   Total Protein 6.5 - 8.1 g/dL 7.9  6.4  6.9   Total Bilirubin 0.3 - 1.2 mg/dL 0.8  0.5  0.7   Alkaline Phos 38 - 126 U/L 72  61  77   AST 15 - 41 U/L 46  22  13   ALT 0 - 44 U/L 66  31  11        RADIOGRAPHIC STUDIES (from last 24 hours if applicable) I have personally reviewed the radiological images as listed and agreed with the findings in the report. No results found.      Visit Diagnosis: 1. Nausea   2. Port-A-Cath in place   3. Adenocarcinoma of right lung, stage 4 ([place])   4. Constipation, unspecified constipation type      No orders of the defined types were placed in this encounter.   All questions were answered. The patient knows to call the clinic with any problems, questions or concerns. No barriers to learning was detected.  I  have spent a total of 30 minutes minutes of face-to-face and non-face-to-face time, preparing to see the patient, obtaining and/or reviewing separately obtained history, performing a medically appropriate examination, counseling and educating the patient, ordering tests, documenting clinical information in the electronic health record.  Thank you for allowing me to participate in the care of this patient.    Barrie Folk, PA-C Department of Hematology/Oncology [facility] at [facility] Phone: [phone]  [phone]    01/13/2022 4:35 PM

## 2022-01-13 NOTE — Telephone Encounter (Signed)
Port site-Port site "oozing pus " and sore x 2 days.  Nausea-Wakes up nauseated. ,bloating,belching upper abdomen tender , vomiting even after ginger ale .She chooses not to eat except soft foods . Last BM yesterday and soft. "Migraine over my eyes and temple area".  She denies fever other symptoms.   [facility] appt today.

## 2022-01-14 LAB — URINE CULTURE: Culture: NO GROWTH

## 2022-01-18 ENCOUNTER — Ambulatory Visit (HOSPITAL_COMMUNITY): Admission: RE | Admit: 2022-01-18 | Payer: Medicaid Other | Source: Ambulatory Visit

## 2022-01-20 ENCOUNTER — Other Ambulatory Visit: Payer: Medicaid Other

## 2022-01-21 NOTE — Progress Notes (Signed)
Cassidy [name], counseling intern, met with patient for their scheduled counseling session.   The patient asked to conduct the session over the phone because they were feeling nauseous from treatment and were suffering with a toothache.   The patient shared their new years eve tradition of praying to ring in the new year. The patient shared they help the homeless in their neighborhood because the patient remembers when they too were homeless. The patient shared a story of their friend who was homeless and recently passed away.   The patient shared they have learned to give energy to themselves instead of giving it away to others. The patient read the counselor inspiring words they have written on sticky notes posted in their home.   The patient shared they left their apartment to meet a friend yesterday. The patient shared they initially did not want to go but knew it would be good for their mental health. The patient reported, in the end, they were grateful they went.   The patient reported they can detect when they are becoming depressed because they stop sewing, stop writing, become isolated and sleep more.   The patient scheduled their next counseling session for Monday, January 22nd at 3:30pm.   Cassidy [name],  Counseling Intern  [phone] [email or web address]

## 2022-01-24 NOTE — Progress Notes (Deleted)
[place] OFFICE PROGRESS NOTE  [name], DO [place] [address]  DIAGNOSIS: Stage IV (T2a, N2, M1c) non-small cell lung cancer, adenocarcinoma presented with right upper lobe lung mass in addition to right hilar and mediastinal lymphadenopathy and innumerable bilateral pulmonary nodules as well as liver and bone metastasis.  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    Detected Alteration(s) / Biomarker(s) Associated FDA-approved therapies  Clinical Trial Availability      % cfDNA or Amplification   KRAS G12C  approved by FDA Adagrasib, Sotorasib Yes   5.3%   IDH1 R132L  approved in other indication Ivosidenib, Olutasidenib Yes      1.9%  PD-L1 expression 89%   PRIOR THERAPY: palliative radiation to the painful metastatic bone lesions at L4 and the sacrum under the care of Dr. Lisbeth Renshaw. Last day on 10/22/21    CURRENT THERAPY:  1) Systemic chemotherapy with carboplatin for AUC of 5, Alimta 500 Mg/M2 and Keytruda 200 Mg IV every 3 weeks.  First dose September 02, 2021.  Status post 7 cycles.  Starting from cycle #5, the patient will start maintenance Alimta and Keytruda IV every 3 weeks.     INTERVAL HISTORY: DALYA MASELLI 57 y.o. female returns  to the clinic today for a follow-up visit. The patient is feeling *** today without any concerning complaints. The patient is currently undergoing palliative systemic chemotherapy/immunotherapy. The patient has been tolerating treatment fairly well.  In the interval since last being seen, she was seen in the symptom management clinic for erythema and drainage from her port-a-cath site. She was treated with doxycycline.   She also had nausea and abdominal pain secondary for constipation. She was instructed to use miralax and stool softener.    The patient denies any fever, chills, or night sweats. Her appetite decreases a few days following treatment. Certain foods are not appetizing. Fruit  tastes good to her. She also reports belching sometimes. She sees Dr. Remi Haggard from GI. Denies changes in her breathing. She uses mucinex for cough in the AM due to phlegm settling. She denies any vomiting. She uses zofran and compazine for her nausea which is effective. She denies any headache or visual changes. She has a history of migraines but states these have "eased up". Of note, she is supposed to have a repeat brain MRI  which is scheduled for 01/26/22.   Since being diagnosed, the patient has had pain secondary to the metastatic bone lesions.  She previously completed palliative radiation.  She is being followed closely by palliative care who is being treated with Xtampza extended release and oxycodone for breakthrough pain.  She is also on a bowel regimen. She is scheduled to see palliative care today.  She is also followed closely by calling from the counselor team which has been helpful for her.   The patient recently had a restaging CT scan performed. She is here for evaluation and repeat blood work and to review her scan prior to starting day 1 cycle #8.    MEDICAL HISTORY: Past Medical History:  Diagnosis Date   Adenocarcinoma of right lung, stage 4 ([facility]) 08/26/2021   Anemia    has sickle cell trait   Atherosclerotic heart disease of native coronary artery with angina pectoris ([facility] [place]) 05/2012, 10/2013   a.  s/p PCTA to dRCA and ostial RPAV-PLA vessel + PDA branch (05/2012)  b. [place] s/p DES to mLAD - Resolute DES 3.0 x 22 (3.48mm -->  3.3 mm)   Bipolar depression (HCC)    Breast abscess    a. right side.    COPD (chronic obstructive pulmonary disease) (HCC)    Depression    Diabetic peripheral neuropathy (HCC)    Fibromyalgia    Genital warts    GERD (gastroesophageal reflux disease)    History of hiatal hernia    HLD (hyperlipidemia)    Hypertension    Pain in limb    a. LE VENOUS DUPLEX, 02/05/2009 - no evidence of deep vein thrombosis, Baker's cyst   Pneumonia    PTSD  (post-traumatic stress disorder)    Sickle cell trait (HCC)    Tobacco abuse    Tooth caries    Type II diabetes mellitus (HCC)     ALLERGIES:  is allergic to amoxil [amoxicillin], chantix [varenicline], suboxone [buprenorphine hcl-naloxone hcl], and emend [fosaprepitant dimeglumine].  MEDICATIONS:  Current Outpatient Medications  Medication Sig Dispense Refill   aspirin EC 81 MG tablet Take 81 mg by mouth daily.     atorvastatin (LIPITOR) 40 MG tablet TAKE 1 TABLET (40 MG TOTAL) BY MOUTH DAILY. 90 tablet 3   busPIRone (BUSPAR) 15 MG tablet TAKE 1/2 TABLET (7.5 MG TOTAL) BY MOUTH TWICE DAILY (Patient taking differently: Take 7.5 mg by mouth 2 (two) times daily.) 30 tablet 11   Carboxymethylcellul-Glycerin (LUBRICATING EYE DROPS OP) Place 1 drop into both eyes daily as needed (dry eyes).     carvedilol (COREG) 12.5 MG tablet TAKE 1 TABLET (12.5 MG TOTAL) BY MOUTH 2 (TWO) TIMES DAILY WITH A MEAL. 180 tablet 1   diclofenac Sodium (VOLTAREN) 1 % GEL Apply 1 application topically 3 times daily as needed for pain 2 g 3   ferrous sulfate 325 (65 FE) MG tablet Take 325 mg by mouth daily with breakfast.     fluticasone (FLONASE) 50 MCG/ACT nasal spray PLACE 1 SPRAY INTO BOTH NOSTRILS DAILY AS NEEDED (CONGESTION). 16 g 1   fluticasone-salmeterol (ADVAIR) 100-50 MCG/ACT AEPB Inhale 1 puff into the lungs 2 (two) times daily. 1 each 3   folic acid (FOLVITE) 1 MG tablet TAKE 1 TABLET (1 MG TOTAL) BY MOUTH DAILY. 30 tablet 3   furosemide (LASIX) 40 MG tablet Take 1 tablet (40 mg total) by mouth daily as needed. If you gain 3 lbs in 24 hours or 5 lbs in 1 week 30 tablet 1   glucose blood (ACCU-CHEK AVIVA PLUS) test strip Use as instructed 50 each 1   insulin detemir (LEVEMIR FLEXPEN) 100 UNIT/ML FlexPen Inject 15 Units into the skin 2 (two) times daily. 15 mL 1   Insulin Pen Needle 32G X 4 MM MISC 1 Needle by Does not apply route in the morning and at bedtime. 120 each 2   ipratropium (ATROVENT HFA) 17  MCG/ACT inhaler Inhale 2 puffs into the lungs every 4 (four) hours as needed for wheezing (COPD).     lidocaine-prilocaine (EMLA) cream Apply 1 Application topically as needed. 30 g 2   nicotine polacrilex (NICORETTE) 2 MG gum Take 1 each (2 mg total) by mouth as needed for smoking cessation. 100 tablet 0   nitroGLYCERIN (NITROSTAT) 0.4 MG SL tablet Place 1 tablet (0.4 mg total) under the tongue every 5 (five) minutes as needed for chest pain. 25 tablet 5   nystatin (MYCOSTATIN) 100000 UNIT/ML suspension Take 5 mLs (500,000 Units total) by mouth 4 (four) times daily. 60 mL 0   ondansetron (ZOFRAN) 8 MG tablet TAKE 1 TABLET (8 MG TOTAL)  BY MOUTH EVERY 8 (EIGHT) HOURS AS NEEDED FOR NAUSEA OR VOMITING. 30 tablet 1   oxyCODONE (OXY IR/ROXICODONE) 5 MG immediate release tablet Take 1-2 tablets (5-10 mg total) by mouth every 6 (six) hours as needed for severe pain. 90 tablet 0   oxyCODONE ER (XTAMPZA ER) 9 MG C12A Take 9 mg by mouth every 12 (twelve) hours. 60 capsule 0   pantoprazole (PROTONIX) 40 MG tablet TAKE 1 TABLET (40 MG TOTAL) BY MOUTH DAILY. 30 tablet 1   polyethylene glycol powder (GLYCOLAX/MIRALAX) 17 GM/SCOOP powder Take 17 g by mouth daily as needed for mild constipation or moderate constipation. 3350 g 1   potassium chloride SA (KLOR-CON M) 20 MEQ tablet Take 1 tablet (20 mEq total) by mouth daily as needed. On days you take Lasix 30 tablet 1   prochlorperazine (COMPAZINE) 10 MG tablet Take 1 tablet (10 mg total) by mouth every 6 (six) hours as needed for nausea or vomiting. 30 tablet 0   sertraline (ZOLOFT) 50 MG tablet TAKE 1 TABLET (50 MG TOTAL) BY MOUTH DAILY. 30 tablet 3   valACYclovir (VALTREX) 1000 MG tablet TAKE FOR 3 DAYS AS NEEDED FOR EACH OUTBREAK 30 tablet 11   vitamin B-12 (CYANOCOBALAMIN) 1000 MCG tablet Take 1,000 mcg by mouth daily.     No current facility-administered medications for this visit.    SURGICAL HISTORY:  Past Surgical History:  Procedure Laterality Date    BLADDER SURGERY  1980   "TVT"   BLADDER SURGERY  2003   "TVT"   BREAST EXCISIONAL BIOPSY     BREAST SURGERY Right    I&D for multiple abscesses   CARDIAC CATHETERIZATION     CORONARY ARTERY BYPASS GRAFT N/A 06/30/2021   Procedure: CORONARY ARTERY BYPASS GRAFTING (CABG) x3 USING LEFT INTERNAL MAMMARY ARTERY,  RIGHT RADIAL ARTERY AND LEFT GREATER SAPHENOUS VEIN;  Surgeon: Corliss Skains, MD;  Location: [facility] OR;  Service: Open Heart Surgery;  Laterality: N/A;   cutting balloon     ENDOVEIN HARVEST OF GREATER SAPHENOUS VEIN Left 06/30/2021   Procedure: ENDOVEIN HARVEST OF GREATER SAPHENOUS VEIN;  Surgeon: Corliss Skains, MD;  Location: [facility] OR;  Service: Open Heart Surgery;  Laterality: Left;   EYE SURGERY Left    laser surgery   FINE NEEDLE ASPIRATION  08/21/2021   Procedure: FINE NEEDLE ASPIRATION (FNA) LINEAR;  Surgeon: Josephine Igo, DO;  Location: [facility] ENDOSCOPY;  Service: Pulmonary;;   INCISE AND DRAIN ABCESS  ; 04/26/11; 08/13/11   right breast   INCISION AND DRAINAGE ABSCESS Right 05/09/2012   Procedure: INCISION AND DRAINAGE RIGHT BREAST ABSCESS;  Surgeon: Wilmon Arms. Corliss Skains, MD;  Location: [facility] OR;  Service: General;  Laterality: Right;   INCISION AND DRAINAGE ABSCESS Right 02/08/2014   Procedure: INCISION AND DRAINAGE RIGHT BREAST ABSCESS;  Surgeon: Manus Rudd, MD;  Location: [facility] OR;  Service: General;  Laterality: Right;   INCISION AND DRAINAGE ABSCESS Right 06/14/2014   Procedure: INCISION AND DRAINAGE RIGHT BREAST ABSCESS;  Surgeon: Manus Rudd, MD;  Location: [facility] OR;  Service: General;  Laterality: Right;   IR IMAGING GUIDED PORT INSERTION  12/08/2021   IRRIGATION AND DEBRIDEMENT ABSCESS  12/19/2010   Procedure: IRRIGATION AND DEBRIDEMENT ABSCESS;  Surgeon: Emelia Loron, MD;  Location[facility] OR;  Service: General;  Laterality: Right;   IRRIGATION AND DEBRIDEMENT ABSCESS  08/13/2011   Procedure: MINOR INCISION AND DRAINAGE OF ABSCESS;  Surgeon: Cherylynn Ridges, MD;  Location:  [facility] OR;  Service: General;  Laterality: Right;  Right Breast    IRRIGATION AND DEBRIDEMENT ABSCESS  11/17/2011   Procedure: IRRIGATION AND DEBRIDEMENT ABSCESS;  Surgeon: Wilmon Arms. Corliss Skains, MD;  Location: [facility] OR;  Service: General;  Laterality: Right;  irrigation and debridement right recurrent breast abscess   LARYNX SURGERY     LEFT HEART CATH AND CORONARY ANGIOGRAPHY N/A 04/09/2021   Procedure: LEFT HEART CATH AND CORONARY ANGIOGRAPHY;  Surgeon: Corky Crafts, MD;  Location: [facility] INVASIVE CV LAB;  Service: Cardiovascular;  Laterality: N/A;   LEFT HEART CATHETERIZATION WITH CORONARY ANGIOGRAM N/A 05/29/2012   Procedure: LEFT HEART CATHETERIZATION WITH CORONARY ANGIOGRAM & PTCA;  Surgeon: Lennette Bihari, MD;  Location: [facility] CATH LAB;  Service: Cardiovascular; Bifurcation dRCA-RPAD/PLA & rPDA  PTCA.   LEFT HEART CATHETERIZATION WITH CORONARY ANGIOGRAM N/A 11/08/2013   Procedure: LEFT HEART CATHETERIZATION WITH CORONARY ANGIOGRAM and Coronary Stent Intervention;  Surgeon: Marykay Lex, MD;  Location: [facility] CATH LAB;  Service: Cardiovascular; mLAD 80% (FFR 0.73) - resolute DES 3.0 x 22 mm (postdilated to 3.3 mm->3.5 mm)   NM MYOVIEW LTD  01/26/2009   Normal study, no evidence of ischemia, EF 67%   ployp removed from voice box 03/30/12     RADIAL ARTERY HARVEST Right 06/30/2021   Procedure: RADIAL ARTERY HARVEST;  Surgeon: Corliss Skains, MD;  Location: [facility] OR;  Service: Open Heart Surgery;  Laterality: Right;   REFRACTIVE SURGERY  ~ 2010   right   TEE WITHOUT CARDIOVERSION N/A 06/30/2021   Procedure: TRANSESOPHAGEAL ECHOCARDIOGRAM (TEE);  Surgeon: Corliss Skains, MD;  Location: [facility] OR;  Service: Open Heart Surgery;  Laterality: N/A;   TONSILLECTOMY  1988   VIDEO BRONCHOSCOPY WITH ENDOBRONCHIAL ULTRASOUND Bilateral 08/21/2021   Procedure: VIDEO BRONCHOSCOPY WITH ENDOBRONCHIAL ULTRASOUND;  Surgeon: Josephine Igo, DO;  Location: [facility] ENDOSCOPY;  Service: Pulmonary;  Laterality: Bilateral;     REVIEW OF SYSTEMS:   Review of Systems  Constitutional: Negative for appetite change, chills, fatigue, fever and unexpected weight change.  HENT:   Negative for mouth sores, nosebleeds, sore throat and trouble swallowing.   Eyes: Negative for eye problems and icterus.  Respiratory: Negative for cough, hemoptysis, shortness of breath and wheezing.   Cardiovascular: Negative for chest pain and leg swelling.  Gastrointestinal: Negative for abdominal pain, constipation, diarrhea, nausea and vomiting.  Genitourinary: Negative for bladder incontinence, difficulty urinating, dysuria, frequency and hematuria.   Musculoskeletal: Negative for back pain, gait problem, neck pain and neck stiffness.  Skin: Negative for itching and rash.  Neurological: Negative for dizziness, extremity weakness, gait problem, headaches, light-headedness and seizures.  Hematological: Negative for adenopathy. Does not bruise/bleed easily.  Psychiatric/Behavioral: Negative for confusion, depression and sleep disturbance. The patient is not nervous/anxious.     PHYSICAL EXAMINATION:  Last menstrual period 06/12/2016.  ECOG PERFORMANCE STATUS: {CHL ONC ECOG [id number]  Physical Exam  Constitutional: Oriented to person, place, and time and well-developed, well-nourished, and in no distress. No distress.  HENT:  Head: Normocephalic and atraumatic.  Mouth/Throat: Oropharynx is clear and moist. No oropharyngeal exudate.  Eyes: Conjunctivae are normal. Right eye exhibits no discharge. Left eye exhibits no discharge. No scleral icterus.  Neck: Normal range of motion. Neck supple.  Cardiovascular: Normal rate, regular rhythm, normal heart sounds and intact distal pulses.   Pulmonary/Chest: Effort normal and breath sounds normal. No respiratory distress. No wheezes. No rales.  Abdominal: Soft. Bowel sounds are normal. Exhibits no distension and no mass. There is no tenderness.  Musculoskeletal: Normal range  of motion.  Exhibits no edema.  Lymphadenopathy:    No cervical adenopathy.  Neurological: Alert and oriented to person, place, and time. Exhibits normal muscle tone. Gait normal. Coordination normal.  Skin: Skin is warm and dry. No rash noted. Not diaphoretic. No erythema. No pallor.  Psychiatric: Mood, memory and judgment normal.  Vitals reviewed.  LABORATORY DATA: Lab Results  Component Value Date   WBC 1.9 (L) 01/13/2022   HGB 10.0 (L) 01/13/2022   HCT 27.8 (L) 01/13/2022   MCV 95.5 01/13/2022   PLT 157 01/13/2022      Chemistry      Component Value Date/Time   NA 135 01/13/2022 1400   NA 140 04/03/2021 1059   K 3.6 01/13/2022 1400   CL 102 01/13/2022 1400   CO2 24 01/13/2022 1400   BUN 11 01/13/2022 1400   BUN 13 04/03/2021 1059   CREATININE 0.79 01/13/2022 1400   CREATININE 0.65 11/06/2013 1520      Component Value Date/Time   CALCIUM 8.9 01/13/2022 1400   ALKPHOS 72 01/13/2022 1400   AST 46 (H) 01/13/2022 1400   ALT 66 (H) 01/13/2022 1400   BILITOT 0.8 01/13/2022 1400       RADIOGRAPHIC STUDIES:  No results found.   ASSESSMENT/PLAN:  This is a very pleasant 57 year old African-American female diagnosed with stage IV (T2 a, N2, M1 C) non-small cell lung cancer, adenocarcinoma.  The patient presented with a right upper lobe lung mass in addition to right hilar and mediastinal lymphadenopathy.  She also has innumerable bilateral pulmonary nodules, metastatic bone, liver, and adrenal lesions.  She was diagnosed in August 2023.  She is positive for K-ras G12 C mutation and her PD-L1 expression is 89%    The patient completed  palliative radiotherapy to the metastatic bone lesions at L4 and the sacrum under the care of Dr. Lisbeth Renshaw.  The last day radiation is tentatively scheduled for 10/22/21.    The patient is currently undergoing palliative systemic chemotherapy with carboplatin for an AUC of 5, Alimta 500 mg per metered square, Keytruda 200 mg IV every 3 weeks.  She is status  post 7 cycles.  Starting from cycle #5 the patient started maintenance Alimta and Keytruda.  The patient recently had a restaging CT scan. Dr. Julien Nordmann personally and independently reviewed the scan and discussed the results with the patient. The scan showed ***.    Labs were reviewed. Recommend she proceed with cycle #8 today as scheduled.   We will see her back for follow-up visit in 3 weeks for evaluation and repeat blood work before undergoing the next cycle of treatment with cycle #8   She will continue to follow with palliative care.  He is scheduled to see them today.  The patient's pain is well controlled with her current regimen.   atient had a brain MRI performed which showed a possible punctate left frontal lobe white matter metastasis that is subtle but poorly resolved vessel not excluded.  We will monitor this closely on repeat brain MRI in 3 months.  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 She is supposed to have this around 1/16.   We reviewed nutrition today. She will continue to use mucinex for her phlegm.    Palliative care and counseling.   The patient was advised to call immediately if she has any concerning symptoms in the interval. The patient voices understanding of current disease status and treatment options and is in agreement with the current  care plan. All questions were answered. The patient knows to call the clinic with any problems, questions or concerns. We can certainly see the patient much sooner if necessary       No orders of the defined types were placed in this encounter.    I spent {CHL ONC TIME VISIT - QJJHE:[id number]} counseling the patient face to face. The total time spent in the appointment was {CHL ONC TIME VISIT - EHUDJ:[id number]}.  Brodi Kari L Jameela Michna, PA-C 01/24/22

## 2022-01-25 ENCOUNTER — Ambulatory Visit (HOSPITAL_COMMUNITY)
Admission: RE | Admit: 2022-01-25 | Discharge: 2022-01-25 | Disposition: A | Discharge status: Home / Self Care | Payer: Medicaid Other | Source: Ambulatory Visit | Attending: Physician Assistant | Admitting: Physician Assistant

## 2022-01-25 DIAGNOSIS — C3491 Malignant neoplasm of unspecified part of right bronchus or lung: Secondary | ICD-10-CM | POA: Diagnosis present

## 2022-01-25 DIAGNOSIS — N3289 Other specified disorders of bladder: Secondary | ICD-10-CM | POA: Diagnosis not present

## 2022-01-25 DIAGNOSIS — J439 Emphysema, unspecified: Secondary | ICD-10-CM | POA: Diagnosis not present

## 2022-01-25 DIAGNOSIS — C3490 Malignant neoplasm of unspecified part of unspecified bronchus or lung: Secondary | ICD-10-CM | POA: Diagnosis not present

## 2022-01-25 MED ORDER — IOHEXOL 300 MG/ML  SOLN
100.0000 mL | Freq: Once | INTRAMUSCULAR | Status: AC | PRN
  Administered 2022-01-25: 100 mL via INTRAVENOUS

## 2022-01-26 ENCOUNTER — Ambulatory Visit (HOSPITAL_COMMUNITY)
Admission: RE | Admit: 2022-01-26 | Discharge: 2022-01-26 | Disposition: A | Discharge status: Home / Self Care | Payer: Medicaid Other | Source: Ambulatory Visit | Attending: Physician Assistant | Admitting: Physician Assistant

## 2022-01-26 DIAGNOSIS — G9389 Other specified disorders of brain: Secondary | ICD-10-CM | POA: Diagnosis not present

## 2022-01-26 DIAGNOSIS — C3491 Malignant neoplasm of unspecified part of right bronchus or lung: Secondary | ICD-10-CM | POA: Diagnosis present

## 2022-01-26 MED ORDER — GADOBUTROL 1 MMOL/ML IV SOLN
9.0000 mL | Freq: Once | INTRAVENOUS | Status: AC | PRN
  Administered 2022-01-26: 9 mL via INTRAVENOUS

## 2022-01-26 NOTE — Progress Notes (Signed)
Pt difficult IV stick. IV team order placed after pt brought back to MRI (pt brought back late due to arriving late and ER pt done in the interim. Waited for IV team to place PIV. No issues. Pt has had multiple MRIs before and specifically questioned if any rash/hives/reaction to MRI contrast and pt stated no prior issues. Pt given contrast by hand injection. Pt hit call button once 2nd post-contrast sequence was running, approx 2 minutes post injection. Pt stated she was going to throw up. Head coil removed and pt assisted with sitting up. Pt given emesis bag and coughed but nothing produced. Pt stated she felt fine and wanted to proceed with finishing the exam. Pt felt good after completion of exam. IV D'ced and pt sent home with no other complaints.

## 2022-01-27 ENCOUNTER — Inpatient Hospital Stay: Payer: Medicaid Other

## 2022-01-27 ENCOUNTER — Inpatient Hospital Stay: Payer: Medicaid Other | Admitting: Nurse Practitioner

## 2022-01-27 ENCOUNTER — Telehealth: Payer: Self-pay

## 2022-01-27 ENCOUNTER — Inpatient Hospital Stay: Payer: Medicaid Other | Admitting: Physician Assistant

## 2022-01-27 NOTE — Telephone Encounter (Signed)
Rene Paci, counseling intern, called patient after the counselor had missed their call.   The patient shared they had car trouble this morning and they were trying to reach someone in the hospital because they were going to have to miss their infusion.   The patient shared they had reached the right people to reschedule. The patient shared they feel exhausted from their busy week of appointments and the car trouble is an added stress.   The counselor told the patient they would call them tomorrow to check in on how they are coping.   Rene Paci,  Counseling Intern  [phone] [email or web address]

## 2022-01-27 NOTE — Progress Notes (Deleted)
Palliative Medicine [facility]  [phone] [phone]   Name: Cassidy [name] Date: 01/27/2022 MRN: [id number]  DOB: [birth year]  Patient Care Team: [name], DO as PCP - General (Family Medicine) O'Neal, Ronnald Ramp, MD as PCP - Cardiology (Cardiology) Emelia Loron, MD as Consulting Physician (General Surgery)    INTERVAL HISTORY: Cassidy [name] is a 57 y.o. 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  Palliative ask to see for symptom management and goals of care.   SOCIAL HISTORY:     reports that she has been smoking cigarettes. She started smoking about 43 years ago. She has a 21.00 pack-year smoking history. She has never used smokeless tobacco. She reports that she does not currently use drugs. She reports that she does not drink alcohol.  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  s/p PCTA to dRCA and ostial RPAV-PLA vessel + PDA branch (05/2012)  b. [place] s/p DES to mLAD - Resolute DES 3.0 x 22 (3.50mm -->3.3 mm)   Bipolar depression (HCC)    Breast abscess    a. right side.    COPD (chronic obstructive pulmonary disease) (HCC)    Depression    Diabetic peripheral neuropathy (HCC)    Fibromyalgia    Genital warts    GERD (gastroesophageal reflux disease)    History of hiatal hernia    HLD (hyperlipidemia)    Hypertension    Pain in limb    a. LE VENOUS DUPLEX, 02/05/2009 - no evidence of deep vein thrombosis, Baker's cyst   Pneumonia    PTSD (post-traumatic stress disorder)    Sickle cell  trait (HCC)    Tobacco abuse    Tooth caries    Type II diabetes mellitus (HCC)     ALLERGIES:  is allergic to amoxil [amoxicillin], chantix [varenicline], suboxone [buprenorphine hcl-naloxone hcl], and emend [fosaprepitant dimeglumine].  MEDICATIONS:  Current Outpatient Medications  Medication Sig Dispense Refill   aspirin EC 81 MG tablet Take 81 mg by mouth daily.     atorvastatin (LIPITOR) 40 MG tablet TAKE 1 TABLET (40 MG TOTAL) BY MOUTH DAILY. 90 tablet 3   busPIRone (BUSPAR) 15 MG tablet TAKE 1/2 TABLET (7.5 MG TOTAL) BY MOUTH TWICE DAILY (Patient taking differently: Take 7.5 mg by mouth 2 (two) times daily.) 30 tablet 11   Carboxymethylcellul-Glycerin (LUBRICATING EYE DROPS OP) Place 1 drop into both eyes daily as needed (dry eyes).     carvedilol (COREG) 12.5 MG tablet TAKE 1 TABLET (12.5 MG TOTAL) BY MOUTH 2 (TWO) TIMES DAILY WITH A MEAL. 180 tablet 1   diclofenac Sodium (VOLTAREN) 1 % GEL Apply 1 application topically 3 times daily as needed for pain 2 g 3   ferrous sulfate 325 (65 FE) MG tablet Take 325 mg by mouth daily with breakfast.     fluticasone (FLONASE) 50 MCG/ACT nasal spray PLACE 1 SPRAY INTO BOTH NOSTRILS DAILY AS NEEDED (CONGESTION). 16 g 1   fluticasone-salmeterol (ADVAIR) 100-50 MCG/ACT AEPB Inhale 1 puff into the lungs 2 (two) times daily. 1 each 3   folic  acid (FOLVITE) 1 MG tablet TAKE 1 TABLET (1 MG TOTAL) BY MOUTH DAILY. 30 tablet 3   furosemide (LASIX) 40 MG tablet Take 1 tablet (40 mg total) by mouth daily as needed. If you gain 3 lbs in 24 hours or 5 lbs in 1 week 30 tablet 1   glucose blood (ACCU-CHEK AVIVA PLUS) test strip Use as instructed 50 each 1   insulin detemir (LEVEMIR FLEXPEN) 100 UNIT/ML FlexPen Inject 15 Units into the skin 2 (two) times daily. 15 mL 1   Insulin Pen Needle 32G X 4 MM MISC 1 Needle by Does not apply route in the morning and at bedtime. 120 each 2   ipratropium (ATROVENT HFA) 17 MCG/ACT inhaler Inhale 2 puffs into the lungs every  4 (four) hours as needed for wheezing (COPD).     lidocaine-prilocaine (EMLA) cream Apply 1 Application topically as needed. 30 g 2   nicotine polacrilex (NICORETTE) 2 MG gum Take 1 each (2 mg total) by mouth as needed for smoking cessation. 100 tablet 0   nitroGLYCERIN (NITROSTAT) 0.4 MG SL tablet Place 1 tablet (0.4 mg total) under the tongue every 5 (five) minutes as needed for chest pain. 25 tablet 5   nystatin (MYCOSTATIN) 100000 UNIT/ML suspension Take 5 mLs (500,000 Units total) by mouth 4 (four) times daily. 60 mL 0   ondansetron (ZOFRAN) 8 MG tablet TAKE 1 TABLET (8 MG TOTAL) BY MOUTH EVERY 8 (EIGHT) HOURS AS NEEDED FOR NAUSEA OR VOMITING. 30 tablet 1   oxyCODONE (OXY IR/ROXICODONE) 5 MG immediate release tablet Take 1-2 tablets (5-10 mg total) by mouth every 6 (six) hours as needed for severe pain. 90 tablet 0   oxyCODONE ER (XTAMPZA ER) 9 MG C12A Take 9 mg by mouth every 12 (twelve) hours. 60 capsule 0   pantoprazole (PROTONIX) 40 MG tablet TAKE 1 TABLET (40 MG TOTAL) BY MOUTH DAILY. 30 tablet 1   polyethylene glycol powder (GLYCOLAX/MIRALAX) 17 GM/SCOOP powder Take 17 g by mouth daily as needed for mild constipation or moderate constipation. 3350 g 1   potassium chloride SA (KLOR-CON M) 20 MEQ tablet Take 1 tablet (20 mEq total) by mouth daily as needed. On days you take Lasix 30 tablet 1   prochlorperazine (COMPAZINE) 10 MG tablet Take 1 tablet (10 mg total) by mouth every 6 (six) hours as needed for nausea or vomiting. 30 tablet 0   sertraline (ZOLOFT) 50 MG tablet TAKE 1 TABLET (50 MG TOTAL) BY MOUTH DAILY. 30 tablet 3   valACYclovir (VALTREX) 1000 MG tablet TAKE FOR 3 DAYS AS NEEDED FOR EACH OUTBREAK 30 tablet 11   vitamin B-12 (CYANOCOBALAMIN) 1000 MCG tablet Take 1,000 mcg by mouth daily.     No current facility-administered medications for this visit.    VITAL SIGNS: LMP 06/12/2016 (Approximate)  There were no vitals filed for this visit.  Estimated body mass index is 31.93  kg/m as calculated from the following:   Height as of 01/06/22: 5' 7.5" (1.715 m).   Weight as of 01/13/22: 206 lb 14.4 oz (93.8 kg).   PERFORMANCE STATUS (ECOG) : 1 - Symptomatic but completely ambulatory   Physical Exam General: NAD Pulmonary: normal breathing pattern  Extremities: no edema, no joint deformities Skin: no rashes Neurological: AAO x3, mood appropriate   IMPRESSION:      Neoplasm related pain Edward shares her pain is well controlled on current regimen. Does not have to take breakthrough medications around the clock. Tolerating Xtampza. Is appreciative of  improvement in pain.   No changes at this time. Will continue to closely monitor.   2. Constipation   I discussed the importance of continued conversation with family and their medical providers regarding overall plan of care and treatment options, ensuring decisions are within the context of the patients values and GOCs.  PLAN: Continue Xtampza 9 mg every 12 hours Oxy IR 5-10 mg every 6 hours as needed for breakthrough pain  MiraLAX for bowel regimen. Unable to tolerate Linzess. Senna 2 tabs twice daily  I will plan to see patient back in 3-4 weeks in collaboration with her oncology appointments.   Patient expressed understanding and was in agreement with this plan. She also understands that She can call the clinic at any time with any questions, concerns, or complaints.    Any controlled substances utilized were prescribed in the context of palliative care. PDMP has been reviewed.   Time Total: 20 min  Visit consisted of counseling and education dealing with the complex and emotionally intense issues of symptom management and palliative care in the setting of serious and potentially life-threatening illness.Greater than 50%  of this time was spent counseling and coordinating care related to the above assessment and plan.  Willette Alma, AGPCNP-BC  Palliative Medicine Team/[facility]

## 2022-01-28 ENCOUNTER — Inpatient Hospital Stay: Payer: Medicaid Other

## 2022-01-28 ENCOUNTER — Inpatient Hospital Stay (HOSPITAL_BASED_OUTPATIENT_CLINIC_OR_DEPARTMENT_OTHER): Payer: Medicaid Other | Admitting: Nurse Practitioner

## 2022-01-28 ENCOUNTER — Encounter: Payer: Self-pay | Admitting: Nurse Practitioner

## 2022-01-28 ENCOUNTER — Other Ambulatory Visit: Payer: Self-pay

## 2022-01-28 ENCOUNTER — Inpatient Hospital Stay (HOSPITAL_BASED_OUTPATIENT_CLINIC_OR_DEPARTMENT_OTHER): Payer: Medicaid Other | Admitting: Physician Assistant

## 2022-01-28 ENCOUNTER — Ambulatory Visit: Payer: Medicaid Other | Admitting: Family Medicine

## 2022-01-28 VITALS — BP 117/75 | HR 71 | Resp 16

## 2022-01-28 VITALS — BP 110/67 | HR 84 | Temp 98.5°F | Resp 16 | Wt 208.5 lb

## 2022-01-28 DIAGNOSIS — Z515 Encounter for palliative care: Secondary | ICD-10-CM

## 2022-01-28 DIAGNOSIS — K5900 Constipation, unspecified: Secondary | ICD-10-CM

## 2022-01-28 DIAGNOSIS — Z5111 Encounter for antineoplastic chemotherapy: Secondary | ICD-10-CM

## 2022-01-28 DIAGNOSIS — C3491 Malignant neoplasm of unspecified part of right bronchus or lung: Secondary | ICD-10-CM | POA: Diagnosis not present

## 2022-01-28 DIAGNOSIS — Z5112 Encounter for antineoplastic immunotherapy: Secondary | ICD-10-CM | POA: Diagnosis not present

## 2022-01-28 DIAGNOSIS — G893 Neoplasm related pain (acute) (chronic): Secondary | ICD-10-CM | POA: Diagnosis not present

## 2022-01-28 LAB — CMP (CANCER CENTER ONLY)
ALT: 40 U/L (ref 0–44)
AST: 32 U/L (ref 15–41)
Albumin: 3.8 g/dL (ref 3.5–5.0)
Alkaline Phosphatase: 71 U/L (ref 38–126)
Anion gap: 4 — ABNORMAL LOW (ref 5–15)
BUN: 10 mg/dL (ref 6–20)
CO2: 29 mmol/L (ref 22–32)
Calcium: 9.1 mg/dL (ref 8.9–10.3)
Chloride: 107 mmol/L (ref 98–111)
Creatinine: 0.88 mg/dL (ref 0.44–1.00)
GFR, Estimated: >60 mL/min (ref >60)
Glucose, Bld: 149 mg/dL — ABNORMAL HIGH (ref 70–99)
Potassium: 3.6 mmol/L (ref 3.5–5.1)
Sodium: 140 mmol/L (ref 135–145)
Total Bilirubin: 0.5 mg/dL (ref 0.3–1.2)
Total Protein: 6.5 g/dL (ref 6.5–8.1)

## 2022-01-28 LAB — CBC WITH DIFFERENTIAL (CANCER CENTER ONLY)
Abs Immature Granulocytes: 0.03 10*3/uL (ref 0.00–0.07)
Basophils Absolute: 0 10*3/uL (ref 0.0–0.1)
Basophils Relative: 1 %
Eosinophils Absolute: 0.1 10*3/uL (ref 0.0–0.5)
Eosinophils Relative: 1 %
HCT: 31.1 % — ABNORMAL LOW (ref 36.0–46.0)
Hemoglobin: 10.6 g/dL — ABNORMAL LOW (ref 12.0–15.0)
Immature Granulocytes: 1 %
Lymphocytes Relative: 12 %
Lymphs Abs: 0.7 10*3/uL (ref 0.7–4.0)
MCH: 34.4 pg — ABNORMAL HIGH (ref 26.0–34.0)
MCHC: 34.1 g/dL (ref 30.0–36.0)
MCV: 101 fL — ABNORMAL HIGH (ref 80.0–100.0)
Monocytes Absolute: 0.6 10*3/uL (ref 0.1–1.0)
Monocytes Relative: 11 %
Neutro Abs: 4.2 10*3/uL (ref 1.7–7.7)
Neutrophils Relative %: 74 %
Platelet Count: 267 10*3/uL (ref 150–400)
RBC: 3.08 MIL/uL — ABNORMAL LOW (ref 3.87–5.11)
RDW: 14 % (ref 11.5–15.5)
WBC Count: 5.6 10*3/uL (ref 4.0–10.5)
nRBC: 0 % (ref 0.0–0.2)

## 2022-01-28 MED ORDER — SODIUM CHLORIDE 0.9% FLUSH
10.0000 mL | INTRAVENOUS | Status: DC | PRN
  Administered 2022-01-28: 10 mL

## 2022-01-28 MED ORDER — ONDANSETRON 8 MG PO TBDP: 8.0000 mg | ORAL_TABLET | Freq: Three times a day (TID) | ORAL | 2 refills | Status: DC | PRN

## 2022-01-28 MED ORDER — OXYCODONE HCL 5 MG PO TABS: 5.0000 mg | ORAL_TABLET | Freq: Four times a day (QID) | ORAL | 0 refills | Status: DC | PRN

## 2022-01-28 MED ORDER — HEPARIN SOD (PORK) LOCK FLUSH 100 UNIT/ML IV SOLN
500.0000 [IU] | Freq: Once | INTRAVENOUS | Status: AC | PRN
  Administered 2022-01-28: 500 [IU]

## 2022-01-28 MED ORDER — PROCHLORPERAZINE MALEATE 10 MG PO TABS
10.0000 mg | ORAL_TABLET | Freq: Once | ORAL | Status: AC
  Administered 2022-01-28: 10 mg via ORAL
  Filled 2022-01-28: qty 1

## 2022-01-28 MED ORDER — SODIUM CHLORIDE 0.9 % IV SOLN: Freq: Once | INTRAVENOUS | Status: AC

## 2022-01-28 MED ORDER — XTAMPZA ER 9 MG PO C12A: 9.0000 mg | EXTENDED_RELEASE_CAPSULE | Freq: Two times a day (BID) | ORAL | 0 refills | Status: DC

## 2022-01-28 MED ORDER — SODIUM CHLORIDE 0.9 % IV SOLN
200.0000 mg | Freq: Once | INTRAVENOUS | Status: AC
  Administered 2022-01-28: 200 mg via INTRAVENOUS
  Filled 2022-01-28: qty 200

## 2022-01-28 MED ORDER — SODIUM CHLORIDE 0.9 % IV SOLN
500.0000 mg/m2 | Freq: Once | INTRAVENOUS | Status: AC
  Administered 2022-01-28: 1100 mg via INTRAVENOUS
  Filled 2022-01-28: qty 40

## 2022-01-28 NOTE — Patient Instructions (Signed)
[facility] MEDICAL [facility]COLOG[facility]   Discharge Instructions: Thank you for choosing [facility] to provide your oncology and hematology care.   If you have a lab appointment with the Cancer Center, please go directly to the Cancer Center and check in at the registration area.   Wear comfortable clothing and clothing appropriate for easy access to any Portacath or PICC line.   We strive to give you quality time with your provider. You may need to reschedule your appointment if you arrive late (15 or more minutes).  Arriving late affects you and other patients whose appointments are after yours.  Also, if you miss three or more appointments without notifying the office, you may be dismissed from the clinic at the provider's discretion.      For prescription refill requests, have your pharmacy contact our office and allow 72 hours for refills to be completed.    Today you received the following chemotherapy and/or immunotherapy agents: Pembrolizumab (Keytruda) and Pemetrexed (Alimta)      To help prevent nausea and vomiting after your treatment, we encourage you to take your nausea medication as directed.  BELOW ARE SYMPTOMS THAT SHOULD BE REPORTED IMMEDIATELY: *FEVER GREATER THAN 100.4 F (38 C) OR HIGHER *CHILLS OR SWEATING *NAUSEA AND VOMITING THAT IS NOT CONTROLLED WITH YOUR NAUSEA MEDICATION *UNUSUAL SHORTNESS OF BREATH *UNUSUAL BRUISING OR BLEEDING *URINARY PROBLEMS (pain or burning when urinating, or frequent urination) *BOWEL PROBLEMS (unusual diarrhea, constipation, pain near the anus) TENDERNESS IN MOUTH AND THROAT WITH OR WITHOUT PRESENCE OF ULCERS (sore throat, sores in mouth, or a toothache) UNUSUAL RASH, SWELLING OR PAIN  UNUSUAL VAGINAL DISCHARGE OR ITCHING   Items with * indicate a potential emergency and should be followed up as soon as possible or go to the Emergency Department if any problems should occur.  Please show the CHEMOTHERAPY ALERT CARD or  IMMUNOTHERAPY ALERT CARD at check-in to the Emergency Department and triage nurse.  Should you have questions after your visit or need to cancel or reschedule your appointment, please contact [facility]  Dept: [phone]  and follow the prompts.  Office hours are 8:00 a.m. to 4:30 p.m. Monday - Friday. Please note that voicemails left after 4:00 p.m. may not be returned until the following business day.  We are closed weekends and major holidays. You have access to a nurse at all times for urgent questions. Please call the main number to the clinic Dept: [phone] and follow the prompts.   For any non-urgent questions, you may also contact your provider using MyChart. We now offer e-Visits for anyone 35 and older to request care online for non-urgent symptoms. For details visit [email or web address].   Also download the MyChart app! Go to the app store, search "MyChart", open the app, select [place], and log in with your MyChart username and password.

## 2022-01-28 NOTE — Progress Notes (Signed)
Palliative Medicine [facility]  Telephone:(336) [phone] Fax:(336) [phone]   Name: Cassidy [name] Date: 01/28/2022 MRN: [id number]  DOB: 24-Jul-1965  Patient Care Team: [name], DO as PCP - General (Family Medicine) O'Neal, Ronnald Ramp, MD as PCP - Cardiology (Cardiology) Emelia Loron, MD as Consulting Physician (General Surgery)    INTERVAL HISTORY: Cassidy [name] is a 57 y.o. 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  Palliative ask to see for symptom management and goals of care.   SOCIAL HISTORY:     reports that she has been smoking cigarettes. She started smoking about 43 years ago. She has a 21.00 pack-year smoking history. She has never used smokeless tobacco. She reports that she does not currently use drugs. She reports that she does not drink alcohol.  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  s/p PCTA to dRCA and ostial RPAV-PLA vessel + PDA branch (05/2012)  b. [place] s/p DES to mLAD - Resolute DES 3.0 x 22 (3.72mm -->3.3 mm)   Bipolar depression (HCC)    Breast abscess    a. right side.    COPD (chronic obstructive pulmonary disease) (HCC)    Depression    Diabetic peripheral neuropathy (HCC)    Fibromyalgia    Genital warts    GERD (gastroesophageal reflux disease)    History of hiatal hernia    HLD (hyperlipidemia)    Hypertension    Pain in limb    a. LE VENOUS DUPLEX, 02/05/2009 - no evidence of deep vein thrombosis, Baker's cyst   Pneumonia    PTSD (post-traumatic stress disorder)    Sickle cell  trait (HCC)    Tobacco abuse    Tooth caries    Type II diabetes mellitus (HCC)     ALLERGIES:  is allergic to amoxil [amoxicillin], chantix [varenicline], suboxone [buprenorphine hcl-naloxone hcl], and emend [fosaprepitant dimeglumine].  MEDICATIONS:  Current Outpatient Medications  Medication Sig Dispense Refill   aspirin EC 81 MG tablet Take 81 mg by mouth daily.     atorvastatin (LIPITOR) 40 MG tablet TAKE 1 TABLET (40 MG TOTAL) BY MOUTH DAILY. 90 tablet 3   busPIRone (BUSPAR) 15 MG tablet TAKE 1/2 TABLET (7.5 MG TOTAL) BY MOUTH TWICE DAILY (Patient taking differently: Take 7.5 mg by mouth 2 (two) times daily.) 30 tablet 11   Carboxymethylcellul-Glycerin (LUBRICATING EYE DROPS OP) Place 1 drop into both eyes daily as needed (dry eyes).     carvedilol (COREG) 12.5 MG tablet TAKE 1 TABLET (12.5 MG TOTAL) BY MOUTH 2 (TWO) TIMES DAILY WITH A MEAL. 180 tablet 1   diclofenac Sodium (VOLTAREN) 1 % GEL Apply 1 application topically 3 times daily as needed for pain 2 g 3   ferrous sulfate 325 (65 FE) MG tablet Take 325 mg by mouth daily with breakfast.     fluticasone (FLONASE) 50 MCG/ACT nasal spray PLACE 1 SPRAY INTO BOTH NOSTRILS DAILY AS NEEDED (CONGESTION). 16 g 1   fluticasone-salmeterol (ADVAIR) 100-50 MCG/ACT AEPB Inhale 1 puff into the lungs 2 (two) times daily. 1 each 3   folic  acid (FOLVITE) 1 MG tablet TAKE 1 TABLET (1 MG TOTAL) BY MOUTH DAILY. 30 tablet 3   furosemide (LASIX) 40 MG tablet Take 1 tablet (40 mg total) by mouth daily as needed. If you gain 3 lbs in 24 hours or 5 lbs in 1 week 30 tablet 1   glucose blood (ACCU-CHEK AVIVA PLUS) test strip Use as instructed 50 each 1   insulin detemir (LEVEMIR FLEXPEN) 100 UNIT/ML FlexPen Inject 15 Units into the skin 2 (two) times daily. 15 mL 1   Insulin Pen Needle 32G X 4 MM MISC 1 Needle by Does not apply route in the morning and at bedtime. 120 each 2   ipratropium (ATROVENT HFA) 17 MCG/ACT inhaler Inhale 2 puffs into the lungs every  4 (four) hours as needed for wheezing (COPD).     lidocaine-prilocaine (EMLA) cream Apply 1 Application topically as needed. 30 g 2   nicotine polacrilex (NICORETTE) 2 MG gum Take 1 each (2 mg total) by mouth as needed for smoking cessation. 100 tablet 0   nitroGLYCERIN (NITROSTAT) 0.4 MG SL tablet Place 1 tablet (0.4 mg total) under the tongue every 5 (five) minutes as needed for chest pain. 25 tablet 5   nystatin (MYCOSTATIN) 100000 UNIT/ML suspension Take 5 mLs (500,000 Units total) by mouth 4 (four) times daily. 60 mL 0   ondansetron (ZOFRAN-ODT) 8 MG disintegrating tablet Take 1 tablet (8 mg total) by mouth every 8 (eight) hours as needed for nausea or vomiting. 30 tablet 2   oxyCODONE (OXY IR/ROXICODONE) 5 MG immediate release tablet Take 1-2 tablets (5-10 mg total) by mouth every 6 (six) hours as needed for severe pain. 90 tablet 0   oxyCODONE ER (XTAMPZA ER) 9 MG C12A Take 9 mg by mouth every 12 (twelve) hours. 60 capsule 0   pantoprazole (PROTONIX) 40 MG tablet TAKE 1 TABLET (40 MG TOTAL) BY MOUTH DAILY. 30 tablet 1   polyethylene glycol powder (GLYCOLAX/MIRALAX) 17 GM/SCOOP powder Take 17 g by mouth daily as needed for mild constipation or moderate constipation. 3350 g 1   potassium chloride SA (KLOR-CON M) 20 MEQ tablet Take 1 tablet (20 mEq total) by mouth daily as needed. On days you take Lasix 30 tablet 1   prochlorperazine (COMPAZINE) 10 MG tablet Take 1 tablet (10 mg total) by mouth every 6 (six) hours as needed for nausea or vomiting. 30 tablet 0   sertraline (ZOLOFT) 50 MG tablet TAKE 1 TABLET (50 MG TOTAL) BY MOUTH DAILY. 30 tablet 3   valACYclovir (VALTREX) 1000 MG tablet TAKE FOR 3 DAYS AS NEEDED FOR EACH OUTBREAK 30 tablet 11   vitamin B-12 (CYANOCOBALAMIN) 1000 MCG tablet Take 1,000 mcg by mouth daily.     No current facility-administered medications for this visit.    VITAL SIGNS: LMP 06/12/2016 (Approximate)  There were no vitals filed for this visit.  Estimated body  mass index is 32.17 kg/m as calculated from the following:   Height as of 01/06/22: 5' 7.5" (1.715 m).   Weight as of an earlier encounter on 01/28/22: 208 lb 8 oz (94.6 kg).   PERFORMANCE STATUS (ECOG) : 1 - Symptomatic but completely ambulatory   Physical Exam General: NAD Pulmonary: normal breathing pattern  Extremities: no edema, no joint deformities Skin: no rashes Neurological: AAO x3, mood appropriate   IMPRESSION:  I saw Ms. Waren during her infusion. Tolerating well. No acute distress noted.   Neoplasm related pain Melenie shares her pain is well controlled on current  regimen. Does not have to take breakthrough medications around the clock. Tolerating Xtampza. Is appreciative of improvement in pain.   No changes at this time. Will continue to closely monitor.   2. Constipation Well controlled with diet and daily Miralax.   I discussed the importance of continued conversation with family and their medical providers regarding overall plan of care and treatment options, ensuring decisions are within the context of the patients values and GOCs.  PLAN: Continue Xtampza 9 mg every 12 hours Oxy IR 5-10 mg every 6 hours as needed for breakthrough pain  MiraLAX for bowel regimen. Unable to tolerate Linzess. Senna 2 tabs twice daily  I will plan to see patient back in 3-4 weeks in collaboration with her oncology appointments.   Patient expressed understanding and was in agreement with this plan. She also understands that She can call the clinic at any time with any questions, concerns, or complaints.    Any controlled substances utilized were prescribed in the context of palliative care. PDMP has been reviewed.    Time Total: 20 min   Visit consisted of counseling and education dealing with the complex and emotionally intense issues of symptom management and palliative care in the setting of serious and potentially life-threatening illness.Greater than 50%  of this time was  spent counseling and coordinating care related to the above assessment and plan.  Willette Alma, AGPCNP-BC  Palliative Medicine Team/[facility]

## 2022-01-28 NOTE — Progress Notes (Signed)
[facility] OFFICE PROGRESS NOTE  [name], DO [address] [place] [address]  DIAGNOSIS: Stage IV (T2a, N2, M1c) non-small cell lung cancer, adenocarcinoma presented with right upper lobe lung mass in addition to right hilar and mediastinal lymphadenopathy and innumerable bilateral pulmonary nodules as well as liver and bone metastasis.  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    Detected Alteration(s) / Biomarker(s) Associated FDA-approved therapies  Clinical Trial Availability      % cfDNA or Amplification   KRAS G12C  approved by FDA Adagrasib, Sotorasib Yes   5.3%   IDH1 R132L  approved in other indication Ivosidenib, Olutasidenib Yes      1.9%  PD-L1 expression 89%   PRIOR THERAPY: : palliative radiation to the painful metastatic bone lesions at L4 and the sacrum under the care of Dr. Mitzi Hansen. Last day on 10/22/21    CURRENT THERAPY: 1) Systemic chemotherapy with carboplatin for AUC of 5, Alimta 500 Mg/M2 and Keytruda 200 Mg IV every 3 weeks.  First dose September 02, 2021.  Status post 7 cycles.  Starting from cycle #5, the patient will start maintenance Alimta and Keytruda IV every 3 weeks.   INTERVAL HISTORY: Cassidy [name] 57 y.o. female returns to the clinic today for a follow-up visit. The patient is feeling well today without any concerning complaints.  She was supposed to have an appointment yesterday for infusion but her car battery died while on her way to the clinic.  She used the [employer] [facility] for her appointment today.  She has a conflicting appointment at her PCPs office later today and is having trouble getting in touch with the office to reschedule the appointment.  Her treatment is starting to get easier for her. The patient is currently undergoing palliative systemic chemotherapy/immunotherapy. The patient has been tolerating treatment fairly well.  In the interval since last being seen, she was seen in the symptom  management clinic for erythema and drainage from her port-a-cath site. She was treated with doxycycline. This is improved.   She also had nausea and abdominal pain secondary for constipation. She was instructed to use miralax and stool softener.  Today, she tells me that the constipation is improving.  She is using MiraLAX.  She reports when normally she may have nausea and vomiting after taking her medications.  She states that she sometimes throws up her antiemetics as well.   The patient denies any fever, chills, or night sweats.  Her decreased appetite is getting better.  Denies changes in her breathing. She uses mucinex for cough in the AM due to phlegm settling. She has a history of migraines. Of note, she had a repeat brain MRI few days ago which did not show any evidence of metastatic disease to the brain.  Since being diagnosed, the patient has had pain secondary to the metastatic bone lesions.  She previously completed palliative radiation.  She is being followed closely by palliative care who is being treated with Xtampza extended release and oxycodone for breakthrough pain.  She is also on a bowel regimen. She is scheduled to see palliative care today.  She is also followed closely by calling from the counselor team which has been helpful for her.   The patient recently had a restaging CT scan performed. She is here for evaluation and repeat blood work and to review her scan prior to starting day 1 cycle #8.    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  s/p PCTA to dRCA and ostial RPAV-PLA vessel + PDA branch (05/2012)  b. [place] s/p DES to mLAD - Resolute DES 3.0 x 22 (3.66mm -->3.3 mm)   Bipolar depression (HCC)    Breast abscess    a. right side.    COPD (chronic obstructive  pulmonary disease) (HCC)    Depression    Diabetic peripheral neuropathy (HCC)    Fibromyalgia    Genital warts    GERD (gastroesophageal reflux disease)    History of hiatal hernia    HLD (hyperlipidemia)    Hypertension    Pain in limb    a. LE VENOUS DUPLEX, 02/05/2009 - no evidence of deep vein thrombosis, Baker's cyst   Pneumonia    PTSD (post-traumatic stress disorder)    Sickle cell trait (HCC)    Tobacco abuse    Tooth caries    Type II diabetes mellitus (HCC)     ALLERGIES:  is allergic to amoxil [amoxicillin], chantix [varenicline], suboxone [buprenorphine hcl-naloxone hcl], and emend [fosaprepitant dimeglumine].  MEDICATIONS:  Current Outpatient Medications  Medication Sig Dispense Refill   aspirin EC 81 MG tablet Take 81 mg by mouth daily.     atorvastatin (LIPITOR) 40 MG tablet TAKE 1 TABLET (40 MG TOTAL) BY MOUTH DAILY. 90 tablet 3   busPIRone (BUSPAR) 15 MG tablet TAKE 1/2 TABLET (7.5 MG TOTAL) BY MOUTH TWICE DAILY (Patient taking differently: Take 7.5 mg by mouth 2 (two) times daily.) 30 tablet 11   Carboxymethylcellul-Glycerin (LUBRICATING EYE DROPS OP) Place 1 drop into both eyes daily as needed (dry eyes).     carvedilol (COREG) 12.5 MG tablet TAKE 1 TABLET (12.5 MG TOTAL) BY MOUTH 2 (TWO) TIMES DAILY WITH A MEAL. 180 tablet 1   diclofenac Sodium (VOLTAREN) 1 % GEL Apply 1 application topically 3 times daily as needed for pain 2 g 3   ferrous sulfate 325 (65 FE) MG tablet Take 325 mg by mouth daily with breakfast.     fluticasone (FLONASE) 50 MCG/ACT nasal spray PLACE 1 SPRAY INTO BOTH NOSTRILS DAILY AS NEEDED (CONGESTION). 16 g 1   fluticasone-salmeterol (ADVAIR) 100-50 MCG/ACT AEPB Inhale 1 puff into the lungs 2 (two) times daily. 1 each 3   folic acid (FOLVITE) 1 MG tablet TAKE 1 TABLET (1 MG TOTAL) BY MOUTH DAILY. 30 tablet 3   furosemide (LASIX) 40 MG tablet Take 1 tablet (40 mg total) by mouth daily as needed. If you gain 3 lbs in 24 hours or 5 lbs in 1 week  30 tablet 1   glucose blood (ACCU-CHEK AVIVA PLUS) test strip Use as instructed 50 each 1   insulin detemir (LEVEMIR FLEXPEN) 100 UNIT/ML FlexPen Inject 15 Units into the skin 2 (two) times daily. 15 mL 1   Insulin Pen Needle 32G X 4 MM MISC 1 Needle by Does not apply route in the morning and at bedtime. 120 each 2   ipratropium (ATROVENT HFA) 17 MCG/ACT inhaler Inhale 2 puffs into the lungs every 4 (four) hours as needed for wheezing (COPD).     lidocaine-prilocaine (EMLA) cream Apply 1 Application topically as needed. 30 g 2   nicotine polacrilex (NICORETTE) 2 MG gum Take 1 each (2 mg total) by mouth as needed for smoking cessation.  100 tablet 0   nitroGLYCERIN (NITROSTAT) 0.4 MG SL tablet Place 1 tablet (0.4 mg total) under the tongue every 5 (five) minutes as needed for chest pain. 25 tablet 5   nystatin (MYCOSTATIN) 100000 UNIT/ML suspension Take 5 mLs (500,000 Units total) by mouth 4 (four) times daily. 60 mL 0   ondansetron (ZOFRAN-ODT) 8 MG disintegrating tablet Take 1 tablet (8 mg total) by mouth every 8 (eight) hours as needed for nausea or vomiting. 30 tablet 2   oxyCODONE (OXY IR/ROXICODONE) 5 MG immediate release tablet Take 1-2 tablets (5-10 mg total) by mouth every 6 (six) hours as needed for severe pain. 90 tablet 0   oxyCODONE ER (XTAMPZA ER) 9 MG C12A Take 9 mg by mouth every 12 (twelve) hours. 60 capsule 0   pantoprazole (PROTONIX) 40 MG tablet TAKE 1 TABLET (40 MG TOTAL) BY MOUTH DAILY. 30 tablet 1   polyethylene glycol powder (GLYCOLAX/MIRALAX) 17 GM/SCOOP powder Take 17 g by mouth daily as needed for mild constipation or moderate constipation. 3350 g 1   potassium chloride SA (KLOR-CON M) 20 MEQ tablet Take 1 tablet (20 mEq total) by mouth daily as needed. On days you take Lasix 30 tablet 1   prochlorperazine (COMPAZINE) 10 MG tablet Take 1 tablet (10 mg total) by mouth every 6 (six) hours as needed for nausea or vomiting. 30 tablet 0   sertraline (ZOLOFT) 50 MG tablet TAKE 1  TABLET (50 MG TOTAL) BY MOUTH DAILY. 30 tablet 3   valACYclovir (VALTREX) 1000 MG tablet TAKE FOR 3 DAYS AS NEEDED FOR EACH OUTBREAK 30 tablet 11   vitamin B-12 (CYANOCOBALAMIN) 1000 MCG tablet Take 1,000 mcg by mouth daily.     No current facility-administered medications for this visit.    SURGICAL HISTORY:  Past Surgical History:  Procedure Laterality Date   BLADDER SURGERY  1980   "TVT"   BLADDER SURGERY  2003   "TVT"   BREAST EXCISIONAL BIOPSY     BREAST SURGERY Right    I&D for multiple abscesses   CARDIAC CATHETERIZATION     CORONARY ARTERY BYPASS GRAFT N/A 06/30/2021   Procedure: CORONARY ARTERY BYPASS GRAFTING (CABG) x3 USING LEFT INTERNAL MAMMARY ARTERY,  RIGHT RADIAL ARTERY AND LEFT GREATER SAPHENOUS VEIN;  Surgeon: Corliss Skains, MD;  Location: [facility] OR;  Service: Open Heart Surgery;  Laterality: N/A;   cutting balloon     ENDOVEIN HARVEST OF GREATER SAPHENOUS VEIN Left 06/30/2021   Procedure: ENDOVEIN HARVEST OF GREATER SAPHENOUS VEIN;  Surgeon: Corliss Skains, MD;  Location: [facility] OR;  Service: Open Heart Surgery;  Laterality: Left;   EYE SURGERY Left    laser surgery   FINE NEEDLE ASPIRATION  08/21/2021   Procedure: FINE NEEDLE ASPIRATION (FNA) LINEAR;  Surgeon: Josephine Igo, DO;  Location: [facility];  Service: Pulmonary;;   INCISE AND DRAIN ABCESS  ; 04/26/11; 08/13/11   right breast   INCISION AND DRAINAGE ABSCESS Right 05/09/2012   Procedure: INCISION AND DRAINAGE RIGHT BREAST ABSCESS;  Surgeon: Wilmon Arms. Corliss Skains, MD;  Location: [facility] OR;  Service: General;  Laterality: Right;   INCISION AND DRAINAGE ABSCESS Right 02/08/2014   Procedure: INCISION AND DRAINAGE RIGHT BREAST ABSCESS;  Surgeon: Manus Rudd, MD;  Location: [facility] OR;  Service: General;  Laterality: Right;   INCISION AND DRAINAGE ABSCESS Right 06/14/2014   Procedure: INCISION AND DRAINAGE RIGHT BREAST ABSCESS;  Surgeon: Manus Rudd, MD;  Location: [facility] OR;  Service: General;  Laterality: Right;   IR  IMAGING GUIDED PORT INSERTION  12/08/2021   IRRIGATION AND DEBRIDEMENT ABSCESS  12/19/2010   Procedure: IRRIGATION AND DEBRIDEMENT ABSCESS;  Surgeon: Emelia Loron, MD;  Location: [facility] OR;  Service: General;  Laterality: Right;   IRRIGATION AND DEBRIDEMENT ABSCESS  08/13/2011   Procedure: MINOR INCISION AND DRAINAGE OF ABSCESS;  Surgeon: Cherylynn Ridges, MD;  Location: [facility] OR;  Service: General;  Laterality: Right;  Right Breast    IRRIGATION AND DEBRIDEMENT ABSCESS  11/17/2011   Procedure: IRRIGATION AND DEBRIDEMENT ABSCESS;  Surgeon: Wilmon Arms. Corliss Skains, MD;  Location: [facility];  Service: General;  Laterality: Right;  irrigation and debridement right recurrent breast abscess   LARYNX SURGERY     LEFT HEART CATH AND CORONARY ANGIOGRAPHY N/A 04/09/2021   Procedure: LEFT HEART CATH AND CORONARY ANGIOGRAPHY;  Surgeon: Corky Crafts, MD;  Location: [facility] INVASIVE CV LAB;  Service: Cardiovascular;  Laterality: N/A;   LEFT HEART CATHETERIZATION WITH CORONARY ANGIOGRAM N/A 05/29/2012   Procedure: LEFT HEART CATHETERIZATION WITH CORONARY ANGIOGRAM & PTCA;  Surgeon: Lennette Bihari, MD;  Location: [facility] CATH LAB;  Service: Cardiovascular; Bifurcation dRCA-RPAD/PLA & rPDA  PTCA.   LEFT HEART CATHETERIZATION WITH CORONARY ANGIOGRAM N/A 11/08/2013   Procedure: LEFT HEART CATHETERIZATION WITH CORONARY ANGIOGRAM and Coronary Stent Intervention;  Surgeon: Marykay Lex, MD;  Location: [facility] CATH LAB;  Service: Cardiovascular; mLAD 80% (FFR 0.73) - resolute DES 3.0 x 22 mm (postdilated to 3.3 mm->3.5 mm)   NM MYOVIEW LTD  01/26/2009   Normal study, no evidence of ischemia, EF 67%   ployp removed from voice box 03/30/12     RADIAL ARTERY HARVEST Right 06/30/2021   Procedure: RADIAL ARTERY HARVEST;  Surgeon: Corliss Skains, MD;  Location: [facility];  Service: Open Heart Surgery;  Laterality: Right;   REFRACTIVE SURGERY  ~ 2010   right   TEE WITHOUT CARDIOVERSION N/A 06/30/2021   Procedure: TRANSESOPHAGEAL  ECHOCARDIOGRAM (TEE);  Surgeon: Corliss Skains, MD;  Location: [facility] OR;  Service: Open Heart Surgery;  Laterality: N/A;   TONSILLECTOMY  1988   VIDEO BRONCHOSCOPY WITH ENDOBRONCHIAL ULTRASOUND Bilateral 08/21/2021   Procedure: VIDEO BRONCHOSCOPY WITH ENDOBRONCHIAL ULTRASOUND;  Surgeon: Josephine Igo, DO;  Location: [facility];  Service: Pulmonary;  Laterality: Bilateral;    REVIEW OF SYSTEMS:   Constitutional: Positive for stable fatigue.  Positive for improving diminished appetite.  Negative for appetite change, chills, fever and unexpected weight change.  HENT:  Negative for mouth sores, nosebleeds, or sore throat.  Eyes: Negative for eye problems and icterus.  Respiratory: Positive for stable mild cough in the AM. Negative for hemoptysis, shortness of breath and wheezing.   Cardiovascular: Negative for chest pain and leg swelling.  Gastrointestinal: Positive for intermittent nausea and vomiting.  Positive for occasional constipation. Negative for abdominal pain,  diarrhea, and vomiting.  Genitourinary: Negative for bladder incontinence, frequency and hematuria.   Musculoskeletal: Positive for improved pain control. Negative for gait problem, neck pain and neck stiffness.  Skin: Negative for itching and rash.  Neurological: Positive for migraines.  Negative for dizziness, extremity weakness, gait problem, light-headedness and seizures.  Hematological: Negative for adenopathy. Does not bruise/bleed easily.  Psychiatric/Behavioral: Negative for confusion, depression and sleep disturbance. The patient is not nervous/anxious   PHYSICAL EXAMINATION:  Blood pressure 110/67, pulse 84, temperature 98.5 F (36.9 C), temperature source Oral, resp. rate 16, weight 208 lb 8 oz (94.6 kg), last menstrual period 06/12/2016, SpO2 93 %.  ECOG PERFORMANCE STATUS: 1  Physical Exam  Constitutional: Oriented to person, place, and time and well-developed, well-nourished, and in no distress.  HENT:   Head: Normocephalic and atraumatic.  Mouth/Throat: Oropharynx is clear and moist. No oropharyngeal exudate.  Eyes: Conjunctivae are normal. Right eye exhibits no discharge. Left eye exhibits no discharge. No scleral icterus.  Neck: Normal range of motion. Neck supple.  Cardiovascular: Normal rate, regular rhythm, normal heart sounds and intact distal pulses.   Pulmonary/Chest: Effort normal and breath sounds normal. No respiratory distress. No wheezes. No rales.  Abdominal: Soft. Bowel sounds are normal. Exhibits no distension and no mass. There is no tenderness.  Musculoskeletal: Normal range of motion. Exhibits no edema.  Lymphadenopathy:    No cervical adenopathy.  Neurological: Alert and oriented to person, place, and time. Exhibits normal muscle tone. Gait normal. Coordination normal.  Skin: Multiple large scars (right forearm and sternum). Skin is warm and dry. No rash noted. Not diaphoretic. No erythema. No pallor.  Psychiatric: Mood, memory and judgment normal.  Vitals reviewed.  LABORATORY DATA: Lab Results  Component Value Date   WBC 5.6 01/28/2022   HGB 10.6 (L) 01/28/2022   HCT 31.1 (L) 01/28/2022   MCV 101.0 (H) 01/28/2022   PLT 267 01/28/2022      Chemistry      Component Value Date/Time   NA 135 01/13/2022 1400   NA 140 04/03/2021 1059   K 3.6 01/13/2022 1400   CL 102 01/13/2022 1400   CO2 24 01/13/2022 1400   BUN 11 01/13/2022 1400   BUN 13 04/03/2021 1059   CREATININE 0.79 01/13/2022 1400   CREATININE 0.65 11/06/2013 1520      Component Value Date/Time   CALCIUM 8.9 01/13/2022 1400   ALKPHOS 72 01/13/2022 1400   AST 46 (H) 01/13/2022 1400   ALT 66 (H) 01/13/2022 1400   BILITOT 0.8 01/13/2022 1400       RADIOGRAPHIC STUDIES:  MR Brain W Wo Contrast  Result Date: 01/28/2022 CLINICAL DATA:  Metastatic evaluation, three-month follow-up of left frontal white matter lesion. EXAM: MRI HEAD WITHOUT AND WITH CONTRAST TECHNIQUE: Multiplanar, multiecho  pulse sequences of the brain and surrounding structures were obtained without and with intravenous contrast. CONTRAST:  73mL GADAVIST GADOBUTROL 1 MMOL/ML IV SOLN COMPARISON:  09/30/2021 FINDINGS: Brain: No abnormal enhancement. The suspicious area lateral to the frontal horn of the left lateral ventricle is not seen today. Area of diffusion hyperintensity in the subcortical anterior right frontal lobe is persistent and from shine through, this area is dark on postcontrast assessment. No incidental infarct, hemorrhage, hydrocephalus, or collection. Brain volume is normal Vascular: Normal flow voids and vascular enhancement Skull and upper cervical spine: Regression of the avidly enhancing calvarial metastases seen previously. The 2 smaller are no longer seen and the midline lesion overlying the superior sagittal sinus is regressed to a 7 mm area of enhancement that is not directly contiguous with a vessel. Sinuses/Orbits: Negative IMPRESSION: 1. No intracranial enhancement to suggest metastatic disease, including at the left frontal white matter site highlighted previously. 2. Notable regression of calvarial metastases. Electronically Signed   By: Tiburcio Pea M.D.   On: 01/28/2022 04:28   CT Chest W Contrast  Result Date: 01/25/2022 CLINICAL DATA:  Non-small cell lung cancer, assess treatment response. * Tracking Code: BO * EXAM: CT CHEST, ABDOMEN, AND PELVIS WITH CONTRAST TECHNIQUE: Multidetector CT imaging of the chest, abdomen and pelvis was performed following the standard protocol during bolus administration of intravenous contrast. RADIATION DOSE REDUCTION: This exam was performed  according to the departmental dose-optimization program which includes automated exposure control, adjustment of the mA and/or kV according to patient size and/or use of iterative reconstruction technique. CONTRAST:  OMNIPAQUE IOHEXOL 300 MG/ML  SOLN COMPARISON:  11/23/2021 and PET 08/28/2021. FINDINGS: CT CHEST FINDINGS  Cardiovascular: Atherosclerotic calcification of the aorta. Heart size normal. No pericardial effusion. Mediastinum/Nodes: Subcentimeter low-attenuation lesion in the right thyroid. No follow-up recommended. (Ref: J Am Coll Radiol. 2015 Feb;12(2): 143-50).No pathologically enlarged mediastinal, hilar or axillary lymph nodes. Esophagus is grossly unremarkable. Lungs/Pleura: Mild paraseptal emphysema. Irregular apical segment right upper lobe nodule has decreased slightly in size, now measuring 0.9 x 1.9 cm (506/34), previously 1.3 x 2.3 cm on 11/23/2021. Scattered pulmonary parenchymal scarring. No pleural fluid. Airway is unremarkable. Musculoskeletal: Degenerative changes in the spine. Old left rib fracture. No worrisome lytic or sclerotic lesions. CT ABDOMEN PELVIS FINDINGS Hepatobiliary: Low-attenuation lesions in the liver measure up to 2.3 cm in the caudate, similar to PET 08/28/2021, likely a combination of cysts and hemangiomas. Previously seen hypodense metastases are not visualized. No new hypodense lesions in the liver. Liver is enlarged, 18.9 cm. Liver and gallbladder are otherwise unremarkable. No biliary ductal dilatation. Pancreas: Negative. Spleen: Negative. Adrenals/Urinary Tract: Chronically stable 2.1 x 2.9 cm right adrenal nodule, shown to be fluid in density on 08/28/2021. No specific follow-up necessary. Slight nodular thickening of the medial limb left adrenal gland, likely stable. Kidneys are unremarkable. Ureters are decompressed. Bladder wall is thickened. Stomach/Bowel: Stomach, small bowel, appendix and colon are unremarkable. Vascular/Lymphatic: Atherosclerotic calcification of the aorta. No pathologically enlarged lymph nodes. Reproductive: Uterus is visualized.  No adnexal mass. Other: Small pelvic free fluid. Mesenteries and peritoneum are unremarkable. Musculoskeletal: Degenerative changes in the spine. Sclerotic lesions in the L3, L4 and S1 vertebral bodies, unchanged. IMPRESSION: 1.  Continue slight regression of right upper lobe bronchogenic carcinoma. Similar treated osseous metastatic disease. No evidence of new metastatic disease. 2. Hepatomegaly. Previously seen hepatic metastases are no longer visualized. 3. Chronically stable right adrenal nodule, indicative of an adenoma. 4. Chronic bladder wall thickening. 5.  Aortic atherosclerosis (ICD10-I70.0). 6.  Emphysema (ICD10-J43.9). Electronically Signed   By: Leanna Battles M.D.   On: 01/25/2022 11:29   CT Abdomen Pelvis W Contrast  Result Date: 01/25/2022 CLINICAL DATA:  Non-small cell lung cancer, assess treatment response. * Tracking Code: BO * EXAM: CT CHEST, ABDOMEN, AND PELVIS WITH CONTRAST TECHNIQUE: Multidetector CT imaging of the chest, abdomen and pelvis was performed following the standard protocol during bolus administration of intravenous contrast. RADIATION DOSE REDUCTION: This exam was performed according to the departmental dose-optimization program which includes automated exposure control, adjustment of the mA and/or kV according to patient size and/or use of iterative reconstruction technique. CONTRAST:  OMNIPAQUE IOHEXOL 300 MG/ML  SOLN COMPARISON:  11/23/2021 and PET 08/28/2021. FINDINGS: CT CHEST FINDINGS Cardiovascular: Atherosclerotic calcification of the aorta. Heart size normal. No pericardial effusion. Mediastinum/Nodes: Subcentimeter low-attenuation lesion in the right thyroid. No follow-up recommended. (Ref: J Am Coll Radiol. 2015 Feb;12(2): 143-50).No pathologically enlarged mediastinal, hilar or axillary lymph nodes. Esophagus is grossly unremarkable. Lungs/Pleura: Mild paraseptal emphysema. Irregular apical segment right upper lobe nodule has decreased slightly in size, now measuring 0.9 x 1.9 cm (506/34), previously 1.3 x 2.3 cm on 11/23/2021. Scattered pulmonary parenchymal scarring. No pleural fluid. Airway is unremarkable. Musculoskeletal: Degenerative changes in the spine. Old left rib fracture.  No worrisome lytic or sclerotic lesions. CT ABDOMEN PELVIS FINDINGS Hepatobiliary: Low-attenuation lesions in the liver measure up  to 2.3 cm in the caudate, similar to PET 08/28/2021, likely a combination of cysts and hemangiomas. Previously seen hypodense metastases are not visualized. No new hypodense lesions in the liver. Liver is enlarged, 18.9 cm. Liver and gallbladder are otherwise unremarkable. No biliary ductal dilatation. Pancreas: Negative. Spleen: Negative. Adrenals/Urinary Tract: Chronically stable 2.1 x 2.9 cm right adrenal nodule, shown to be fluid in density on 08/28/2021. No specific follow-up necessary. Slight nodular thickening of the medial limb left adrenal gland, likely stable. Kidneys are unremarkable. Ureters are decompressed. Bladder wall is thickened. Stomach/Bowel: Stomach, small bowel, appendix and colon are unremarkable. Vascular/Lymphatic: Atherosclerotic calcification of the aorta. No pathologically enlarged lymph nodes. Reproductive: Uterus is visualized.  No adnexal mass. Other: Small pelvic free fluid. Mesenteries and peritoneum are unremarkable. Musculoskeletal: Degenerative changes in the spine. Sclerotic lesions in the L3, L4 and S1 vertebral bodies, unchanged. IMPRESSION: 1. Continue slight regression of right upper lobe bronchogenic carcinoma. Similar treated osseous metastatic disease. No evidence of new metastatic disease. 2. Hepatomegaly. Previously seen hepatic metastases are no longer visualized. 3. Chronically stable right adrenal nodule, indicative of an adenoma. 4. Chronic bladder wall thickening. 5.  Aortic atherosclerosis (ICD10-I70.0). 6.  Emphysema (ICD10-J43.9). Electronically Signed   By: Lorin Picket M.D.   On: 01/25/2022 11:29     ASSESSMENT/PLAN:  This is a very pleasant 57 year old African-American female diagnosed with stage IV (T2 a, N2, M1 C) non-small cell lung cancer, adenocarcinoma.  The patient presented with a right upper lobe lung mass in  addition to right hilar and mediastinal lymphadenopathy.  She also has innumerable bilateral pulmonary nodules, metastatic bone, liver, and adrenal lesions.  She was diagnosed in August 2023.  She is positive for K-ras G12 C mutation and her PD-L1 expression is 89%    The patient completed  palliative radiotherapy to the metastatic bone lesions at L4 and the sacrum under the care of Dr. Lisbeth Renshaw.  The last day radiation is tentatively scheduled for 10/22/21.    The patient is currently undergoing palliative systemic chemotherapy with carboplatin for an AUC of 5, Alimta 500 mg per metered square, Keytruda 200 mg IV every 3 weeks.  She is status post 7 cycles.  Starting from cycle #5 the patient started maintenance Alimta and Keytruda.  The patient recently had a restaging CT scan. Dr. Julien Nordmann personally and independently reviewed the scan and discussed the results with the patient. The scan showed no evidence of disease progression.  Her repeat brain MRI also was negative for metastatic disease to the brain.  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    Labs were reviewed. Recommend she proceed with cycle #8 today as scheduled.   We will see her back for follow-up visit in 3 weeks for evaluation and repeat blood work before undergoing the next cycle of treatment with cycle #8   She will continue to follow with palliative care.  He is scheduled to see them today.  The patient's pain is well controlled with her current regimen.   She will see palliative care today.   I sent her prescription for ODT Zofran since she sometimes throws up for antinausea pills.  I have reached out to her PCP to help reschedule her appointment with their office.  The patient was advised to call immediately if she has any concerning symptoms in the interval. The patient voices understanding of current disease status and treatment options and is in agreement with the current care plan. All questions were answered. The patient  knows  to call the clinic with any problems, questions or concerns. We can certainly see the patient much sooner if necessary     No orders of the defined types were placed in this encounter.    Kmari Halter L Amillia Biffle, PA-C 01/28/22  ADDENDUM: Hematology/Oncology Attending: I had a face-to-face encounter with the patient today.  I reviewed her records, lab, scan and recommended her care plan.  This is a very pleasant 57 years old African-American female with Stage IV (T2a, N2, M1c) non-small cell lung cancer, adenocarcinoma presented with right upper lobe lung mass in addition to right hilar and mediastinal lymphadenopathy and innumerable bilateral pulmonary nodules as well as liver and bone metastasis.  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  She has positive KRAS G12C mutation and PD-L1 expression of 89%. She is status post palliative radiotherapy to the metastatic bone lesion at L4 and sacrum.  The patient is currently undergoing systemic chemotherapy initially with induction carboplatin, Alimta and Keytruda for 4 cycles and currently on maintenance treatment with Alimta and Keytruda every 3 weeks status post 3 more cycles. The patient has been tolerating this treatment well with no concerning adverse effects.  She has less fatigue and weakness and no significant shortness of breath. The patient had repeat CT scan of the chest, abdomen and pelvis performed recently.  I personally and independently reviewed the scans and discussed results with the patient today. Her scan showed no evidence for disease progression and there was some further improvement of her disease. I recommended for her to continue her current maintenance treatment with Alimta and Keytruda every 3 weeks and she will proceed with cycle #8 today. She will come back for follow-up visit in 3 weeks for evaluation before the next cycle of her treatment. The patient was advised to call immediately if she has any  other concerning symptoms in the interval. The total time spent in the appointment was 30 minutes. Disclaimer: This note was dictated with voice recognition software. Similar sounding words can inadvertently be transcribed and may be missed upon review. Lajuana Matte, MD

## 2022-02-01 ENCOUNTER — Other Ambulatory Visit: Payer: Self-pay | Admitting: Physician Assistant

## 2022-02-01 DIAGNOSIS — C3491 Malignant neoplasm of unspecified part of right bronchus or lung: Secondary | ICD-10-CM

## 2022-02-01 NOTE — Progress Notes (Signed)
Rene Paci, counseling intern, met with patient for their scheduled counseling session.   The patient shared they are not speaking to their brother at the moment. The patient reflected they are currently putting up boundaries in their relationships where needed.   The patient reflected they used to be afraid of change but now they accept change. The counselor asked what changed. The patient reflected they did not want to fail or be stuck. The counselor reflected it sounds like the patient desires growth and sees change as a part of growth.   Due to the counselor ending their services in April, the counselor told the patient she would bring referrals for counselors that take Medicaid to the next session.   The patient scheduled their next session for 1/29 at 2pm.   Rene Paci,  Counseling Intern  [phone] [email or web address]

## 2022-02-01 NOTE — Patient Instructions (Addendum)
It was wonderful to see you today.  Please bring ALL of your medications with you to every visit.   Today we talked about:  Keep your insulin at 15 units twice a day Continue checking your sugars and logging them. Return in 6 months for another diabetes check.   Thank you for coming to your visit as scheduled. We have had a large "no-show" problem lately, and this significantly limits our ability to see and care for patients. As a friendly reminder- if you cannot make your appointment please call to cancel. We do have a no show policy for those who do not cancel within 24 hours. Our policy is that if you miss or fail to cancel an appointment within 24 hours, 3 times in a 42-month period, you may be dismissed from our clinic.   Thank you for choosing [facility].   Please call [phone] with any questions about today's appointment.  Please be sure to schedule follow up at the front  desk before you leave today.   [name], DO PGY-3 Family Medicine

## 2022-02-01 NOTE — Progress Notes (Signed)
    SUBJECTIVE:   CHIEF COMPLAINT / HPI:   Cassidy [name] is a 57 y.o. female who presents to the [facility] clinic today to discuss the following concerns:   Diabetes, Type 2 - Last A1c 6.8 in June 2023 - Medications: Levemir 15U BID - Compliance: Great  - Checking BG at home: Checks morning fasting sugars- they range 120s-150s - Diet: Appetite is still not great. She is undergoing chemo for her lung cancer. She is drinking some nutritional shakes - Foot exam: UTD - Microalbumin: UTD - Statin: Atorvastatin 40 mg    PERTINENT  PMH / PSH: Stage 4 adenocarcinoma of the lung, hypertension, CAD s/p CABG x3, GERD, T2DM, PTSD, bipolar 1     OBJECTIVE:   BP 104/67   Wt 207 lb 4 oz (94 kg)   LMP 06/12/2016 (Approximate)   BMI 31.98 kg/m    General: NAD, pleasant, able to participate in exam Respiratory: normal effort Abdomen: Bowel sounds present, nontender, non-distended Skin: warm and dry Psych: Normal affect and mood  ASSESSMENT/PLAN:   1. Type 2 diabetes mellitus without complication, without long-term current use of insulin (HCC) Hgb A1c today 6.2, last A1c 6.8 in June (prior to chemo). Does not seem to be experiencing any low sugars. -Return in 6 months -Continue Levemir 15U BID -Continue statin -UTD foot, eye exam  -Recommend transition to glucerna shakes    [name], DO [facility]

## 2022-02-02 ENCOUNTER — Ambulatory Visit (INDEPENDENT_AMBULATORY_CARE_PROVIDER_SITE_OTHER): Payer: Medicaid Other | Admitting: Family Medicine

## 2022-02-02 ENCOUNTER — Encounter: Payer: Self-pay | Admitting: Family Medicine

## 2022-02-02 VITALS — BP 104/67 | Wt 207.2 lb

## 2022-02-02 DIAGNOSIS — E119 Type 2 diabetes mellitus without complications: Secondary | ICD-10-CM

## 2022-02-02 DIAGNOSIS — Z794 Long term (current) use of insulin: Secondary | ICD-10-CM | POA: Diagnosis not present

## 2022-02-02 LAB — POCT GLYCOSYLATED HEMOGLOBIN (HGB A1C): HbA1c, POC (controlled diabetic range): 6.2 % (ref 0.0–7.0)

## 2022-02-02 MED ORDER — ACCU-CHEK AVIVA PLUS VI STRP: ORAL_STRIP | 1 refills | Status: DC

## 2022-02-02 MED ORDER — LEVEMIR FLEXPEN 100 UNIT/ML ~~LOC~~ SOPN: 18.0000 [IU] | PEN_INJECTOR | Freq: Two times a day (BID) | SUBCUTANEOUS | 1 refills | Status: DC

## 2022-02-02 MED ORDER — INSULIN PEN NEEDLE 32G X 4 MM MISC: 1.0000 | Freq: Two times a day (BID) | 2 refills | Status: DC

## 2022-02-08 ENCOUNTER — Telehealth: Payer: Self-pay

## 2022-02-08 NOTE — Telephone Encounter (Signed)
Cassidy [name], counseling intern, called patient for their scheduled counseling session.   The patient reported they had forgotten about the session and asked to reschedule.   The patient rescheduled for Monday, Feb 5th at [place],  Counseling Intern  [phone] [email or web address]

## 2022-02-15 NOTE — Progress Notes (Signed)
Cassidy [name], counseling intern, met with patient for their scheduled counseling session.   The patient shared they are not feeling well today. The patient shared they are nauseous after taking their medicine and they have puffy eyes and blurry vision. The patient reflected it has been difficult managing their symptoms and knowing which ones are from cancer treatment, high blood pressure or diabetes.    The patient shared they spent time with their son this weekend. The patient shared that they often feel like they don't want company but once the company leaves they find themselves grateful for the visit.   The patient shared they are still not talking to their brother. The patient shared this is the longest they have gone not speaking to their brother and it is causing them emotional pain. The patient reflected they know that this time is temporary and their relationship with their brother will heal.   The patient shared they are planning on taking their journal and sketch book to the park this afternoon.   The patient scheduled their next counseling session for Thursday, Feb. 15th at 2:30pm.   Cassidy [name],  Counseling Intern  [phone] [email or web address]

## 2022-02-17 ENCOUNTER — Other Ambulatory Visit: Payer: Self-pay

## 2022-02-17 ENCOUNTER — Inpatient Hospital Stay: Payer: Medicaid Other

## 2022-02-17 ENCOUNTER — Inpatient Hospital Stay: Payer: Medicaid Other | Attending: Internal Medicine | Admitting: Internal Medicine

## 2022-02-17 VITALS — BP 148/76 | HR 64 | Resp 16

## 2022-02-17 VITALS — BP 109/65 | HR 69 | Temp 97.7°F | Resp 16 | Wt 210.5 lb

## 2022-02-17 DIAGNOSIS — G893 Neoplasm related pain (acute) (chronic): Secondary | ICD-10-CM | POA: Insufficient documentation

## 2022-02-17 DIAGNOSIS — C7951 Secondary malignant neoplasm of bone: Secondary | ICD-10-CM | POA: Diagnosis not present

## 2022-02-17 DIAGNOSIS — I2510 Atherosclerotic heart disease of native coronary artery without angina pectoris: Secondary | ICD-10-CM | POA: Diagnosis not present

## 2022-02-17 DIAGNOSIS — F4310 Post-traumatic stress disorder, unspecified: Secondary | ICD-10-CM | POA: Diagnosis not present

## 2022-02-17 DIAGNOSIS — R110 Nausea: Secondary | ICD-10-CM | POA: Insufficient documentation

## 2022-02-17 DIAGNOSIS — C3491 Malignant neoplasm of unspecified part of right bronchus or lung: Secondary | ICD-10-CM | POA: Diagnosis not present

## 2022-02-17 DIAGNOSIS — Z95828 Presence of other vascular implants and grafts: Secondary | ICD-10-CM

## 2022-02-17 DIAGNOSIS — C787 Secondary malignant neoplasm of liver and intrahepatic bile duct: Secondary | ICD-10-CM | POA: Insufficient documentation

## 2022-02-17 DIAGNOSIS — Z5112 Encounter for antineoplastic immunotherapy: Secondary | ICD-10-CM | POA: Insufficient documentation

## 2022-02-17 DIAGNOSIS — Z5111 Encounter for antineoplastic chemotherapy: Secondary | ICD-10-CM | POA: Diagnosis present

## 2022-02-17 DIAGNOSIS — E1142 Type 2 diabetes mellitus with diabetic polyneuropathy: Secondary | ICD-10-CM | POA: Diagnosis not present

## 2022-02-17 DIAGNOSIS — Z8774 Personal history of (corrected) congenital malformations of heart and circulatory system: Secondary | ICD-10-CM | POA: Insufficient documentation

## 2022-02-17 DIAGNOSIS — Z880 Allergy status to penicillin: Secondary | ICD-10-CM | POA: Insufficient documentation

## 2022-02-17 DIAGNOSIS — I10 Essential (primary) hypertension: Secondary | ICD-10-CM | POA: Insufficient documentation

## 2022-02-17 DIAGNOSIS — K5900 Constipation, unspecified: Secondary | ICD-10-CM | POA: Diagnosis not present

## 2022-02-17 DIAGNOSIS — Z7951 Long term (current) use of inhaled steroids: Secondary | ICD-10-CM | POA: Insufficient documentation

## 2022-02-17 DIAGNOSIS — C7971 Secondary malignant neoplasm of right adrenal gland: Secondary | ICD-10-CM | POA: Diagnosis not present

## 2022-02-17 DIAGNOSIS — Z515 Encounter for palliative care: Secondary | ICD-10-CM | POA: Diagnosis not present

## 2022-02-17 DIAGNOSIS — C3411 Malignant neoplasm of upper lobe, right bronchus or lung: Secondary | ICD-10-CM | POA: Insufficient documentation

## 2022-02-17 DIAGNOSIS — Z923 Personal history of irradiation: Secondary | ICD-10-CM | POA: Insufficient documentation

## 2022-02-17 DIAGNOSIS — C7972 Secondary malignant neoplasm of left adrenal gland: Secondary | ICD-10-CM | POA: Diagnosis not present

## 2022-02-17 DIAGNOSIS — Z79899 Other long term (current) drug therapy: Secondary | ICD-10-CM | POA: Diagnosis not present

## 2022-02-17 LAB — CMP (CANCER CENTER ONLY)
ALT: 36 U/L (ref 0–44)
AST: 32 U/L (ref 15–41)
Albumin: 3.5 g/dL (ref 3.5–5.0)
Alkaline Phosphatase: 63 U/L (ref 38–126)
Anion gap: 3 — ABNORMAL LOW (ref 5–15)
BUN: 8 mg/dL (ref 6–20)
CO2: 30 mmol/L (ref 22–32)
Calcium: 8.6 mg/dL — ABNORMAL LOW (ref 8.9–10.3)
Chloride: 108 mmol/L (ref 98–111)
Creatinine: 0.83 mg/dL (ref 0.44–1.00)
GFR, Estimated: >60 mL/min (ref >60)
Glucose, Bld: 157 mg/dL — ABNORMAL HIGH (ref 70–99)
Potassium: 3.9 mmol/L (ref 3.5–5.1)
Sodium: 141 mmol/L (ref 135–145)
Total Bilirubin: 0.5 mg/dL (ref 0.3–1.2)
Total Protein: 6 g/dL — ABNORMAL LOW (ref 6.5–8.1)

## 2022-02-17 LAB — CBC WITH DIFFERENTIAL (CANCER CENTER ONLY)
Abs Immature Granulocytes: 0.01 10*3/uL (ref 0.00–0.07)
Basophils Absolute: 0 10*3/uL (ref 0.0–0.1)
Basophils Relative: 0 %
Eosinophils Absolute: 0.1 10*3/uL (ref 0.0–0.5)
Eosinophils Relative: 1 %
HCT: 29.6 % — ABNORMAL LOW (ref 36.0–46.0)
Hemoglobin: 9.8 g/dL — ABNORMAL LOW (ref 12.0–15.0)
Immature Granulocytes: 0 %
Lymphocytes Relative: 12 %
Lymphs Abs: 0.6 10*3/uL — ABNORMAL LOW (ref 0.7–4.0)
MCH: 33.8 pg (ref 26.0–34.0)
MCHC: 33.1 g/dL (ref 30.0–36.0)
MCV: 102.1 fL — ABNORMAL HIGH (ref 80.0–100.0)
Monocytes Absolute: 0.6 10*3/uL (ref 0.1–1.0)
Monocytes Relative: 11 %
Neutro Abs: 3.8 10*3/uL (ref 1.7–7.7)
Neutrophils Relative %: 76 %
Platelet Count: 247 10*3/uL (ref 150–400)
RBC: 2.9 MIL/uL — ABNORMAL LOW (ref 3.87–5.11)
RDW: 14 % (ref 11.5–15.5)
WBC Count: 5 10*3/uL (ref 4.0–10.5)
nRBC: 0 % (ref 0.0–0.2)

## 2022-02-17 LAB — TSH: TSH: 2.814 u[IU]/mL (ref 0.350–4.500)

## 2022-02-17 MED ORDER — HEPARIN SOD (PORK) LOCK FLUSH 100 UNIT/ML IV SOLN
500.0000 [IU] | Freq: Once | INTRAVENOUS | Status: AC | PRN
  Administered 2022-02-17: 500 [IU]

## 2022-02-17 MED ORDER — PROCHLORPERAZINE MALEATE 10 MG PO TABS
10.0000 mg | ORAL_TABLET | Freq: Once | ORAL | Status: AC
  Administered 2022-02-17: 10 mg via ORAL
  Filled 2022-02-17: qty 1

## 2022-02-17 MED ORDER — CYANOCOBALAMIN 1000 MCG/ML IJ SOLN
1000.0000 ug | Freq: Once | INTRAMUSCULAR | Status: AC
  Administered 2022-02-17: 1000 ug via INTRAMUSCULAR
  Filled 2022-02-17: qty 1

## 2022-02-17 MED ORDER — SODIUM CHLORIDE 0.9 % IV SOLN
200.0000 mg | Freq: Once | INTRAVENOUS | Status: AC
  Administered 2022-02-17: 200 mg via INTRAVENOUS
  Filled 2022-02-17: qty 8

## 2022-02-17 MED ORDER — SODIUM CHLORIDE 0.9% FLUSH
10.0000 mL | INTRAVENOUS | Status: DC | PRN
  Administered 2022-02-17: 10 mL

## 2022-02-17 MED ORDER — SODIUM CHLORIDE 0.9 % IV SOLN: Freq: Once | INTRAVENOUS | Status: AC

## 2022-02-17 MED ORDER — SODIUM CHLORIDE 0.9 % IV SOLN
500.0000 mg/m2 | Freq: Once | INTRAVENOUS | Status: AC
  Administered 2022-02-17: 1100 mg via INTRAVENOUS
  Filled 2022-02-17: qty 40

## 2022-02-17 MED ORDER — SODIUM CHLORIDE 0.9% FLUSH
10.0000 mL | Freq: Once | INTRAVENOUS | Status: AC
  Administered 2022-02-17: 10 mL

## 2022-02-17 NOTE — Progress Notes (Signed)
[name] [phone]   [phone]  OFFICE PROGRESS NOTE  [name], DO [place] [address]  DIAGNOSIS: stage IV (T2a, N2, M1c) non-small cell lung cancer, adenocarcinoma presented with right upper lobe lung mass in addition to right hilar and mediastinal lymphadenopathy and innumerable bilateral pulmonary nodules as well as liver and bone metastasis diagnosed in August 2023.   Detected Alteration(s) / Biomarker(s) Associated FDA-approved therapies Clinical Trial Availability % cfDNA or Amplification  KRAS G12C  approved by FDA Adagrasib, Sotorasib Yes 5.3%  IDH1 R132L  approved in other indication Ivosidenib, Olutasidenib Yes 1.9%  PD-L1 expression 89%  PRIOR THERAPY: None  CURRENT THERAPY: Systemic chemotherapy with carboplatin for AUC of 5, Alimta 500 Mg/M2 and Keytruda 200 Mg IV every 3 weeks.  First dose September 02, 2021.  Status post 8 cycles.  Starting from cycle #5 she is on maintenance treatment with Alimta and Keytruda every 3 weeks.  INTERVAL HISTORY: Cassidy [name] 57 y.o. female returns to the clinic today for follow-up visit.  The patient is feeling fine today with no concerning complaints except for several vague complaints including abdominal pain as well as numbness in the toes and slow movement of the food when she eats it.  She denied having any current chest pain but has shortness of breath with exertion with no cough or hemoptysis.  She has no current nausea, vomiting, diarrhea but has intermittent constipation and she is using stool softener.  She is currently on pain medication with Oxy IR.  For breakthrough pain in addition to [facility] 9 mg managed by the palliative care team.  She has been tolerating her maintenance treatment with Alimta and Keytruda fairly well.  She is here today for evaluation before starting cycle #9.   MEDICAL HISTORY: Past Medical History:  Diagnosis Date    Adenocarcinoma of right lung, stage 4 ([place]) 08/26/2021   Anemia    has sickle cell trait   Atherosclerotic heart disease of native coronary artery with angina pectoris ([place]) 05/2012, 10/2013   a.  s/p PCTA to dRCA and ostial RPAV-PLA vessel + PDA branch (05/2012)  b. [place] s/p DES to mLAD - Resolute DES 3.0 x 22 (3.67mm -->3.3 mm)   Bipolar depression ([place])    Breast abscess    a. right side.    COPD (chronic obstructive pulmonary disease) (HCC)    Depression    Diabetic peripheral neuropathy (HCC)    Fibromyalgia    Genital warts    GERD (gastroesophageal reflux disease)    History of hiatal hernia    HLD (hyperlipidemia)    Hypertension    Pain in limb    a. LE VENOUS DUPLEX, 02/05/2009 - no evidence of deep vein thrombosis, Baker's cyst   Pneumonia    PTSD (post-traumatic stress disorder)    Sickle cell trait (HCC)    Tobacco abuse    Tooth caries    Type II diabetes mellitus (HCC)     ALLERGIES:  is allergic to amoxil [amoxicillin], chantix [varenicline], suboxone [buprenorphine hcl-naloxone hcl], and emend [fosaprepitant dimeglumine].  MEDICATIONS:  Current Outpatient Medications  Medication Sig Dispense Refill   aspirin EC 81 MG tablet Take 81 mg by mouth daily.     atorvastatin (LIPITOR) 40 MG tablet TAKE 1 TABLET (40 MG TOTAL) BY MOUTH DAILY. 90 tablet 3   busPIRone (BUSPAR) 15 MG tablet TAKE 1/2 TABLET (7.5 MG TOTAL) BY MOUTH TWICE DAILY (Patient  taking differently: Take 7.5 mg by mouth 2 (two) times daily.) 30 tablet 11   Carboxymethylcellul-Glycerin (LUBRICATING EYE DROPS OP) Place 1 drop into both eyes daily as needed (dry eyes).     carvedilol (COREG) 12.5 MG tablet TAKE 1 TABLET (12.5 MG TOTAL) BY MOUTH 2 (TWO) TIMES DAILY WITH A MEAL. 180 tablet 1   diclofenac Sodium (VOLTAREN) 1 % GEL Apply 1 application topically 3 times daily as needed for pain 2 g 3   ferrous sulfate 325 (65 FE) MG tablet Take 325 mg by mouth daily with breakfast.     fluticasone (FLONASE) 50  MCG/ACT nasal spray PLACE 1 SPRAY INTO BOTH NOSTRILS DAILY AS NEEDED (CONGESTION). 16 g 1   fluticasone-salmeterol (ADVAIR) 100-50 MCG/ACT AEPB Inhale 1 puff into the lungs 2 (two) times daily. 1 each 3   folic acid (FOLVITE) 1 MG tablet TAKE 1 TABLET (1 MG TOTAL) BY MOUTH DAILY. 30 tablet 3   furosemide (LASIX) 40 MG tablet Take 1 tablet (40 mg total) by mouth daily as needed. If you gain 3 lbs in 24 hours or 5 lbs in 1 week 30 tablet 1   glucose blood (ACCU-CHEK AVIVA PLUS) test strip Use as instructed 50 each 1   insulin detemir (LEVEMIR FLEXPEN) 100 UNIT/ML FlexPen Inject 18 Units into the skin 2 (two) times daily. 15 mL 1   Insulin Pen Needle 32G X 4 MM MISC 1 Needle by Does not apply route in the morning and at bedtime. 120 each 2   ipratropium (ATROVENT HFA) 17 MCG/ACT inhaler Inhale 2 puffs into the lungs every 4 (four) hours as needed for wheezing (COPD).     lidocaine-prilocaine (EMLA) cream APPLY 1 APPLICATION TOPICALLY AS NEEDED. 30 g 2   nicotine polacrilex (NICORETTE) 2 MG gum Take 1 each (2 mg total) by mouth as needed for smoking cessation. 100 tablet 0   nitroGLYCERIN (NITROSTAT) 0.4 MG SL tablet Place 1 tablet (0.4 mg total) under the tongue every 5 (five) minutes as needed for chest pain. 25 tablet 5   nystatin (MYCOSTATIN) 100000 UNIT/ML suspension Take 5 mLs (500,000 Units total) by mouth 4 (four) times daily. 60 mL 0   ondansetron (ZOFRAN-ODT) 8 MG disintegrating tablet Take 1 tablet (8 mg total) by mouth every 8 (eight) hours as needed for nausea or vomiting. (Patient not taking: Reported on 02/02/2022) 30 tablet 2   oxyCODONE (OXY IR/ROXICODONE) 5 MG immediate release tablet Take 1-2 tablets (5-10 mg total) by mouth every 6 (six) hours as needed for severe pain. 90 tablet 0   oxyCODONE ER (XTAMPZA ER) 9 MG C12A Take 9 mg by mouth every 12 (twelve) hours. 60 capsule 0   pantoprazole (PROTONIX) 40 MG tablet TAKE 1 TABLET (40 MG TOTAL) BY MOUTH DAILY. 30 tablet 1   polyethylene  glycol powder (GLYCOLAX/MIRALAX) 17 GM/SCOOP powder Take 17 g by mouth daily as needed for mild constipation or moderate constipation. 3350 g 1   potassium chloride SA (KLOR-CON M) 20 MEQ tablet Take 1 tablet (20 mEq total) by mouth daily as needed. On days you take Lasix 30 tablet 1   prochlorperazine (COMPAZINE) 10 MG tablet Take 1 tablet (10 mg total) by mouth every 6 (six) hours as needed for nausea or vomiting. 30 tablet 0   sertraline (ZOLOFT) 50 MG tablet TAKE 1 TABLET (50 MG TOTAL) BY MOUTH DAILY. 30 tablet 3   valACYclovir (VALTREX) 1000 MG tablet TAKE FOR 3 DAYS AS NEEDED FOR EACH OUTBREAK 30 tablet  11   vitamin B-12 (CYANOCOBALAMIN) 1000 MCG tablet Take 1,000 mcg by mouth daily.     No current facility-administered medications for this visit.    SURGICAL HISTORY:  Past Surgical History:  Procedure Laterality Date   BLADDER SURGERY  1980   "TVT"   BLADDER SURGERY  2003   "TVT"   BREAST EXCISIONAL BIOPSY     BREAST SURGERY Right    I&D for multiple abscesses   CARDIAC CATHETERIZATION     CORONARY ARTERY BYPASS GRAFT N/A 06/30/2021   Procedure: CORONARY ARTERY BYPASS GRAFTING (CABG) x3 USING LEFT INTERNAL MAMMARY ARTERY,  RIGHT RADIAL ARTERY AND LEFT GREATER SAPHENOUS VEIN;  Surgeon: Lajuana Matte, MD;  Location: [facility];  Service: Open Heart Surgery;  Laterality: N/A;   cutting balloon     ENDOVEIN HARVEST OF GREATER SAPHENOUS VEIN Left 06/30/2021   Procedure: ENDOVEIN HARVEST OF GREATER SAPHENOUS VEIN;  Surgeon: Lajuana Matte, MD;  Location: [facility];  Service: Open Heart Surgery;  Laterality: Left;   EYE SURGERY Left    laser surgery   FINE NEEDLE ASPIRATION  08/21/2021   Procedure: FINE NEEDLE ASPIRATION (FNA) LINEAR;  Surgeon: Garner Nash, DO;  Location: [facility];  Service: Pulmonary;;   INCISE AND DRAIN ABCESS  ; 04/26/11; 08/13/11   right breast   INCISION AND DRAINAGE ABSCESS Right 05/09/2012   Procedure: INCISION AND DRAINAGE RIGHT BREAST ABSCESS;   Surgeon: Imogene Burn. Georgette Dover, MD;  Location: [facility];  Service: General;  Laterality: Right;   INCISION AND DRAINAGE ABSCESS Right 02/08/2014   Procedure: INCISION AND DRAINAGE RIGHT BREAST ABSCESS;  Surgeon: Donnie Mesa, MD;  Location: [facility];  Service: General;  Laterality: Right;   INCISION AND DRAINAGE ABSCESS Right 06/14/2014   Procedure: INCISION AND DRAINAGE RIGHT BREAST ABSCESS;  Surgeon: Donnie Mesa, MD;  Location: [facility];  Service: General;  Laterality: Right;   IR IMAGING GUIDED PORT INSERTION  12/08/2021   IRRIGATION AND DEBRIDEMENT ABSCESS  12/19/2010   Procedure: IRRIGATION AND DEBRIDEMENT ABSCESS;  Surgeon: Rolm Bookbinder, MD;  Location: [facility];  Service: General;  Laterality: Right;   IRRIGATION AND DEBRIDEMENT ABSCESS  08/13/2011   Procedure: MINOR INCISION AND DRAINAGE OF ABSCESS;  Surgeon: Gwenyth Ober, MD;  Location: [facility];  Service: General;  Laterality: Right;  Right Breast    IRRIGATION AND DEBRIDEMENT ABSCESS  11/17/2011   Procedure: IRRIGATION AND DEBRIDEMENT ABSCESS;  Surgeon: Imogene Burn. Georgette Dover, MD;  Location: [facility];  Service: General;  Laterality: Right;  irrigation and debridement right recurrent breast abscess   LARYNX SURGERY     LEFT HEART CATH AND CORONARY ANGIOGRAPHY N/A 04/09/2021   Procedure: LEFT HEART CATH AND CORONARY ANGIOGRAPHY;  Surgeon: Jettie Booze, MD;  Location: [facility];  Service: Cardiovascular;  Laterality: N/A;   LEFT HEART CATHETERIZATION WITH CORONARY ANGIOGRAM N/A 05/29/2012   Procedure: LEFT HEART CATHETERIZATION WITH CORONARY ANGIOGRAM & PTCA;  Surgeon: Troy Sine, MD;  Location: [facility] C[facility];  Service: Cardiovascular; Bifurcation dRCA-RPAD/PLA & rPDA  PTCA.   LEFT HEART CATHETERIZATION WITH CORONARY ANGIOGRAM N/A 11/08/2013   Procedure: LEFT HEART CATHETERIZATION WITH CORONARY ANGIOGRAM and Coronary Stent Intervention;  Surgeon: Leonie Man, MD;  Location: [facility] C[facility] LAB;  Service: Cardiovascular; mLAD 80% (FFR 0.73) -  resolute DES 3.0 x 22 mm (postdilated to 3.3 mm->3.5 mm)   NM MYOVIEW LTD  01/26/2009   Normal study, no evidence of ischemia, EF 67%   ployp removed from voice  box 03/30/12     RADIAL ARTERY HARVEST Right 06/30/2021   Procedure: RADIAL ARTERY HARVEST;  Surgeon: Lajuana Matte, MD;  Location: [place];  Service: Open Heart Surgery;  Laterality: Right;   REFRACTIVE SURGERY  ~ 2010   right   TEE WITHOUT CARDIOVERSION N/A 06/30/2021   Procedure: TRANSESOPHAGEAL ECHOCARDIOGRAM (TEE);  Surgeon: Lajuana Matte, MD;  Location: [place];  Service: Open Heart Surgery;  Laterality: N/A;   TONSILLECTOMY  1988   VIDEO BRONCHOSCOPY WITH ENDOBRONCHIAL ULTRASOUND Bilateral 08/21/2021   Procedure: VIDEO BRONCHOSCOPY WITH ENDOBRONCHIAL ULTRASOUND;  Surgeon: Garner Nash, DO;  Location: [place];  Service: Pulmonary;  Laterality: Bilateral;    REVIEW OF SYSTEMS:  A comprehensive review of systems was negative except for: Constitutional: positive for fatigue Gastrointestinal: positive for abdominal pain and constipation Neurological: positive for paresthesia   PHYSICAL EXAMINATION: General appearance: alert, cooperative, fatigued, and no distress Head: Normocephalic, without obvious abnormality, atraumatic Neck: no adenopathy, no JVD, supple, symmetrical, trachea midline, and thyroid not enlarged, symmetric, no tenderness/mass/nodules Lymph nodes: Cervical, supraclavicular, and axillary nodes normal. Resp: clear to auscultation bilaterally Back: symmetric, no curvature. ROM normal. No CVA tenderness. Cardio: regular rate and rhythm, S1, S2 normal, no murmur, click, rub or gallop GI: soft, non-tender; bowel sounds normal; no masses,  no organomegaly Extremities: extremities normal, atraumatic, no cyanosis or edema  ECOG PERFORMANCE STATUS: 1 - Symptomatic but completely ambulatory  Blood pressure 109/65, pulse 69, temperature 97.7 F (36.5 C), temperature source Oral, resp. rate 16, weight 210  lb 8 oz (95.5 kg), last menstrual period 06/12/2016, SpO2 99 %.  LABORATORY DATA: Lab Results  Component Value Date   WBC 5.0 02/17/2022   HGB 9.8 (L) 02/17/2022   HCT 29.6 (L) 02/17/2022   MCV 102.1 (H) 02/17/2022   PLT 247 02/17/2022      Chemistry      Component Value Date/Time   NA 140 01/28/2022 0722   NA 140 04/03/2021 1059   K 3.6 01/28/2022 0722   CL 107 01/28/2022 0722   CO2 29 01/28/2022 0722   BUN 10 01/28/2022 0722   BUN 13 04/03/2021 1059   CREATININE 0.88 01/28/2022 0722   CREATININE 0.65 11/06/2013 1520      Component Value Date/Time   CALCIUM 9.1 01/28/2022 0722   ALKPHOS 71 01/28/2022 0722   AST 32 01/28/2022 0722   ALT 40 01/28/2022 0722   BILITOT 0.5 01/28/2022 0722       RADIOGRAPHIC STUDIES: MR Brain W Wo Contrast  Result Date: 01/28/2022 CLINICAL DATA:  Metastatic evaluation, three-month follow-up of left frontal white matter lesion. EXAM: MRI HEAD WITHOUT AND WITH CONTRAST TECHNIQUE: Multiplanar, multiecho pulse sequences of the brain and surrounding structures were obtained without and with intravenous contrast. CONTRAST:  30mL GADAVIST GADOBUTROL 1 MMOL/ML IV SOLN COMPARISON:  09/30/2021 FINDINGS: Brain: No abnormal enhancement. The suspicious area lateral to the frontal horn of the left lateral ventricle is not seen today. Area of diffusion hyperintensity in the subcortical anterior right frontal lobe is persistent and from shine through, this area is dark on postcontrast assessment. No incidental infarct, hemorrhage, hydrocephalus, or collection. Brain volume is normal Vascular: Normal flow voids and vascular enhancement Skull and upper cervical spine: Regression of the avidly enhancing calvarial metastases seen previously. The 2 smaller are no longer seen and the midline lesion overlying the superior sagittal sinus is regressed to a 7 mm area of enhancement that is not directly contiguous with a vessel. Sinuses/Orbits: Negative IMPRESSION:  1. No  intracranial enhancement to suggest metastatic disease, including at the left frontal white matter site highlighted previously. 2. Notable regression of calvarial metastases. Electronically Signed   By: Jorje Guild M.D.   On: 01/28/2022 04:28   CT Chest W Contrast  Result Date: 01/25/2022 CLINICAL DATA:  Non-small cell lung cancer, assess treatment response. * Tracking Code: BO * EXAM: CT CHEST, ABDOMEN, AND PELVIS WITH CONTRAST TECHNIQUE: Multidetector CT imaging of the chest, abdomen and pelvis was performed following the standard protocol during bolus administration of intravenous contrast. RADIATION DOSE REDUCTION: This exam was performed according to the departmental dose-optimization program which includes automated exposure control, adjustment of the mA and/or kV according to patient size and/or use of iterative reconstruction technique. CONTRAST:  18mL OMNIPAQUE IOHEXOL 300 MG/ML  SOLN COMPARISON:  11/23/2021 and PET 08/28/2021. FINDINGS: CT CHEST FINDINGS Cardiovascular: Atherosclerotic calcification of the aorta. Heart size normal. No pericardial effusion. Mediastinum/Nodes: Subcentimeter low-attenuation lesion in the right thyroid. No follow-up recommended. (Ref: J Am Coll Radiol. 2015 Feb;12(2): 143-50).No pathologically enlarged mediastinal, hilar or axillary lymph nodes. Esophagus is grossly unremarkable. Lungs/Pleura: Mild paraseptal emphysema. Irregular apical segment right upper lobe nodule has decreased slightly in size, now measuring 0.9 x 1.9 cm (506/34), previously 1.3 x 2.3 cm on 11/23/2021. Scattered pulmonary parenchymal scarring. No pleural fluid. Airway is unremarkable. Musculoskeletal: Degenerative changes in the spine. Old left rib fracture. No worrisome lytic or sclerotic lesions. CT ABDOMEN PELVIS FINDINGS Hepatobiliary: Low-attenuation lesions in the liver measure up to 2.3 cm in the caudate, similar to PET 08/28/2021, likely a combination of cysts and hemangiomas. Previously  seen hypodense metastases are not visualized. No new hypodense lesions in the liver. Liver is enlarged, 18.9 cm. Liver and gallbladder are otherwise unremarkable. No biliary ductal dilatation. Pancreas: Negative. Spleen: Negative. Adrenals/Urinary Tract: Chronically stable 2.1 x 2.9 cm right adrenal nodule, shown to be fluid in density on 08/28/2021. No specific follow-up necessary. Slight nodular thickening of the medial limb left adrenal gland, likely stable. Kidneys are unremarkable. Ureters are decompressed. Bladder wall is thickened. Stomach/Bowel: Stomach, small bowel, appendix and colon are unremarkable. Vascular/Lymphatic: Atherosclerotic calcification of the aorta. No pathologically enlarged lymph nodes. Reproductive: Uterus is visualized.  No adnexal mass. Other: Small pelvic free fluid. Mesenteries and peritoneum are unremarkable. Musculoskeletal: Degenerative changes in the spine. Sclerotic lesions in the L3, L4 and S1 vertebral bodies, unchanged. IMPRESSION: 1. Continue slight regression of right upper lobe bronchogenic carcinoma. Similar treated osseous metastatic disease. No evidence of new metastatic disease. 2. Hepatomegaly. Previously seen hepatic metastases are no longer visualized. 3. Chronically stable right adrenal nodule, indicative of an adenoma. 4. Chronic bladder wall thickening. 5.  Aortic atherosclerosis (ICD10-I70.0). 6.  Emphysema (ICD10-J43.9). Electronically Signed   By: Lorin Picket M.D.   On: 01/25/2022 11:29   CT Abdomen Pelvis W Contrast  Result Date: 01/25/2022 CLINICAL DATA:  Non-small cell lung cancer, assess treatment response. * Tracking Code: BO * EXAM: CT CHEST, ABDOMEN, AND PELVIS WITH CONTRAST TECHNIQUE: Multidetector CT imaging of the chest, abdomen and pelvis was performed following the standard protocol during bolus administration of intravenous contrast. RADIATION DOSE REDUCTION: This exam was performed according to the departmental dose-optimization program  which includes automated exposure control, adjustment of the mA and/or kV according to patient size and/or use of iterative reconstruction technique. CONTRAST:  120mL OMNIPAQUE IOHEXOL 300 MG/ML  SOLN COMPARISON:  11/23/2021 and PET 08/28/2021. FINDINGS: CT CHEST FINDINGS Cardiovascular: Atherosclerotic calcification of the aorta. Heart size normal. No pericardial  effusion. Mediastinum/Nodes: Subcentimeter low-attenuation lesion in the right thyroid. No follow-up recommended. (Ref: J Am Coll Radiol. 2015 Feb;12(2): 143-50).No pathologically enlarged mediastinal, hilar or axillary lymph nodes. Esophagus is grossly unremarkable. Lungs/Pleura: Mild paraseptal emphysema. Irregular apical segment right upper lobe nodule has decreased slightly in size, now measuring 0.9 x 1.9 cm (506/34), previously 1.3 x 2.3 cm on 11/23/2021. Scattered pulmonary parenchymal scarring. No pleural fluid. Airway is unremarkable. Musculoskeletal: Degenerative changes in the spine. Old left rib fracture. No worrisome lytic or sclerotic lesions. CT ABDOMEN PELVIS FINDINGS Hepatobiliary: Low-attenuation lesions in the liver measure up to 2.3 cm in the caudate, similar to PET 08/28/2021, likely a combination of cysts and hemangiomas. Previously seen hypodense metastases are not visualized. No new hypodense lesions in the liver. Liver is enlarged, 18.9 cm. Liver and gallbladder are otherwise unremarkable. No biliary ductal dilatation. Pancreas: Negative. Spleen: Negative. Adrenals/Urinary Tract: Chronically stable 2.1 x 2.9 cm right adrenal nodule, shown to be fluid in density on 08/28/2021. No specific follow-up necessary. Slight nodular thickening of the medial limb left adrenal gland, likely stable. Kidneys are unremarkable. Ureters are decompressed. Bladder wall is thickened. Stomach/Bowel: Stomach, small bowel, appendix and colon are unremarkable. Vascular/Lymphatic: Atherosclerotic calcification of the aorta. No pathologically enlarged lymph  nodes. Reproductive: Uterus is visualized.  No adnexal mass. Other: Small pelvic free fluid. Mesenteries and peritoneum are unremarkable. Musculoskeletal: Degenerative changes in the spine. Sclerotic lesions in the L3, L4 and S1 vertebral bodies, unchanged. IMPRESSION: 1. Continue slight regression of right upper lobe bronchogenic carcinoma. Similar treated osseous metastatic disease. No evidence of new metastatic disease. 2. Hepatomegaly. Previously seen hepatic metastases are no longer visualized. 3. Chronically stable right adrenal nodule, indicative of an adenoma. 4. Chronic bladder wall thickening. 5.  Aortic atherosclerosis (ICD10-I70.0). 6.  Emphysema (ICD10-J43.9). Electronically Signed   By: Lorin Picket M.D.   On: 01/25/2022 11:29    ASSESSMENT AND PLAN: This is a very pleasant 57 years old African-American female recently diagnosed with a stage IV (T2 a, N2, M1 C) non-small cell lung cancer, adenocarcinoma presented with right upper lobe lung mass in addition to right hilar and mediastinal lymphadenopathy as well as innumerable bilateral pulmonary nodules and bone and liver metastasis diagnosed in August 2023 with positive KRAS G12C mutation and PD-L1 expression of 89%. She is currently undergoing systemic chemotherapy with carboplatin for AUC of 5, Alimta 500 Mg/M2 and Keytruda 200 Mg IV every 3 weeks status post 8 cycles.  The patient has been tolerating this treatment well with no concerning adverse effects. I recommended for her to proceed with cycle #9 today as planned. For the dyspepsia and epigastric pain, she will continue her current treatment with Protonix. For the back pain, she is currently on Norco every 8 hours.  She is followed by the palliative care team.   For the bone metastasis, I completed palliative radiotherapy to the painful bone lesions. The patient will come back for follow-up visit in 3 weeks for evaluation before the next cycle of her treatment. She was advised to  call immediately if she has any other concerning issues in the interval.  The patient voices understanding of current disease status and treatment options and is in agreement with the current care plan.  All questions were answered. The patient knows to call the clinic with any problems, questions or concerns. We can certainly see the patient much sooner if necessary.  Disclaimer: This note was dictated with voice recognition software. Similar sounding words can inadvertently be transcribed  and may not be corrected upon review.

## 2022-02-17 NOTE — Patient Instructions (Signed)
Cassidy [name]   Discharge Instructions: Thank you for choosing [facility] to provide your oncology and hematology care.   If you have a lab appointment with the [facility], please go directly to the [facility] and check in at the registration area.   Wear comfortable clothing and clothing appropriate for easy access to any Portacath or PICC line.   We strive to give you quality time with your provider. You may need to reschedule your appointment if you arrive late (15 or more minutes).  Arriving late affects you and other patients whose appointments are after yours.  Also, if you miss three or more appointments without notifying the office, you may be dismissed from the clinic at the provider's discretion.      For prescription refill requests, have your pharmacy contact our office and allow 72 hours for refills to be completed.    Today you received the following chemotherapy and/or immunotherapy agents: Pembrolizumab (Keytruda) and Pemetrexed (Alimta)      To help prevent nausea and vomiting after your treatment, we encourage you to take your nausea medication as directed.  BELOW ARE SYMPTOMS THAT SHOULD BE REPORTED IMMEDIATELY: *FEVER GREATER THAN 100.4 F (38 C) OR HIGHER *CHILLS OR SWEATING *NAUSEA AND VOMITING THAT IS NOT CONTROLLED WITH YOUR NAUSEA MEDICATION *UNUSUAL SHORTNESS OF BREATH *UNUSUAL BRUISING OR BLEEDING *URINARY PROBLEMS (pain or burning when urinating, or frequent urination) *BOWEL PROBLEMS (unusual diarrhea, constipation, pain near the anus) TENDERNESS IN MOUTH AND THROAT WITH OR WITHOUT PRESENCE OF ULCERS (sore throat, sores in mouth, or a toothache) UNUSUAL RASH, SWELLING OR PAIN  UNUSUAL VAGINAL DISCHARGE OR ITCHING   Items with * indicate a potential emergency and should be followed up as soon as possible or go to the Emergency Department if any problems should occur.  Please show the CHEMOTHERAPY ALERT  CARD or IMMUNOTHERAPY ALERT CARD at check-in to the Emergency Department and triage nurse.  Should you have questions after your visit or need to cancel or reschedule your appointment, please contact [facility]  Dept: [phone]  and follow the prompts.  Office hours are 8:00 a.m. to 4:30 p.m. Monday - Friday. Please note that voicemails left after 4:00 p.m. may not be returned until the following business day.  We are closed weekends and major holidays. You have access to a nurse at all times for urgent questions. Please call the main number to the clinic Dept: [phone] and follow the prompts.   For any non-urgent questions, you may also contact your provider using MyChart. We now offer e-Visits for anyone 77 and older to request care online for non-urgent symptoms. For details visit [email or web address].   Also download the MyChart app! Go to the app store, search "MyChart", open the app, select [facility], and log in with your MyChart username and password.

## 2022-02-19 LAB — T4: T4, Total: 9 ug/dL (ref 4.5–12.0)

## 2022-02-25 NOTE — Progress Notes (Signed)
Cassidy [name], counseling intern, spoke with patient on the phone for their scheduled counseling session.   The patient reported they were not feeling well physically and emotionally. The patient reported their symptoms from chemo are now coming later and coming on stronger. The patient feels like their concerns are not taken seriously. The patient reflected they feel exhausted and want to quit. The patient reflected they feel alone in their treatment.   The counselor suggested the patient speak with the palliative care nurse. The patient said they would call today.   The patient scheduled their next counseling session for Monday, Feb. 19th at [place],  Counseling Intern  [phone] [email or web address]

## 2022-03-01 NOTE — Progress Notes (Signed)
Cassidy [name], counseling intern, called patient for their scheduled counseling session.   The patient shared they feel better than the last time we talked. The patient reflected sometimes they "just have to release everything." The patient shared they had a beautiful weekend - spending an evening with her son and getting her hair done in a new salon on Saturday.   The patient shared about her pain increase. The patient reflected she didn't know "[she] was going to go this fast." The patient shared she is afraid, because of past medical experience, that her pain is a sign something is wrong and not just a symptom of her treatment.   The patient plans on talking with the palliative care nurse when she goes in for chemo next week.   The patient scheduled their next counseling session for Monday, March 4th at [place],  Counseling Intern  [phone] [email or web address]

## 2022-03-06 NOTE — Progress Notes (Unsigned)
[facility] OFFICE PROGRESS NOTE  [name], DO [place] [address]  DIAGNOSIS: Stage IV (T2a, N2, M1c) non-small cell lung cancer, adenocarcinoma presented with right upper lobe lung mass in addition to right hilar and mediastinal lymphadenopathy and innumerable bilateral pulmonary nodules as well as liver and bone metastasis.  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    Detected Alteration(s) / Biomarker(s) Associated FDA-approved therapies  Clinical Trial Availability      % cfDNA or Amplification   KRAS G12C  approved by FDA Adagrasib, Sotorasib Yes   5.3%   IDH1 R132L  approved in other indication Ivosidenib, Olutasidenib Yes      1.9%   PD-L1 expression 89%   PRIOR THERAPY:  Palliative radiation to the painful metastatic bone lesions at L4 and the sacrum under the care of Dr. Lisbeth Renshaw. Last day on 10/22/21     CURRENT THERAPY: 1) Systemic chemotherapy with carboplatin for AUC of 5, Alimta 500 Mg/M2 and Keytruda 200 Mg IV every 3 weeks.  First dose September 02, 2021.  Status post 9 cycles.  Starting from cycle #5, the patient will start maintenance Alimta and Keytruda IV every 3 weeks. Starting from cycle #10, reduced the dose of Alimta to 400 mg/m2 due to intolerance  INTERVAL HISTORY: Cassidy [name] 57 y.o. female returns  to the clinic today for a follow-up visit.  The patient is not in a great place emotionally today.  She struggles with depression anxiety at baseline which is exacerbated by her lung cancer diagnosis.  She is having a hard time with side effects of treatment affecting her new normal.  We had a discussion today about quality of life while undergoing treatment.  We discussed options such as discontinuing treatment, proceeding on single agent immunotherapy which is generally tolerable, reducing the dose of Alimta (chemotherapy), or continuing on her current treatment and managing side effects is much as possible.  The  patient states that she is hesitant to discontinue her chemotherapy since she has had a positive response to treatment from her prior restaging CT scans.  Of note she is due for an upcoming restaging CT scan in the next 3 weeks before her next appointment.   The last 3 weeks have been particularly challenging for her.  She states that she is felt very lonely and is tearful during the encounter today.  Support groups and the resources here at the cancer center for therapy, yoga, etc. have previously been shared with the patient.  She is considering joining the next lung cancer support group which will be in March, however she was not able to join this last month due to needing assistance with how to use Zoom.  She meets with the counseling intern which is helpful for her.  In the last 3 weeks, she states that she thought about quitting treatment.  Regarding side effects of treatment, she reports the fatigue is now lasting longer than prior.  She also mentions some ongoing breast numbness since she had open heart surgery previously.  She states she is due for a mammogram soon.  Denies any breast lumps.  She also has nausea and vomiting and sometimes throws up her medications.  Of note, the patient does sometimes take her medications on an empty stomach.  She has Zofran and Compazine for nausea and vomiting and we discussed alternating these.  She also intermittently struggles with constipation although she feels like this has been under control recently.  Because of this, she sometimes has abdominal cramping.  She is thought about reaching out to her gastroenterologist due to sometimes having difficulty swallowing water first thing in the morning.  She states she is trying to increase her fruits and vegetable intake to help with constipation.  She also takes stool softener.  She uses prune juice.  He also struggles with postnasal drainage and mucus.  She uses Flonase.  She does not use any antihistamine.   She does also use Mucinex.  She also follows closely with palliative care for her metastatic bone pain.  She states that she is tired of being in pain all the time which is affecting her ability to sleep. She previously completed palliative radiation.  She is being treated with Xtampza extended release and oxycodone for breakthrough pain.    Denies any fever, chills, or night sweats.  She lost a few pounds since last being seen.  Denies any major changes in her breathing such as worsening cough, worsening chest pain, or any hemoptysis.  She has been struggling with migraines recently.  Of note the patient had a brain MRI last month to assess her migraines which was negative for metastatic disease.  She would be open to seeing a neurologist.  She is here today for evaluation and repeat blood work before considering starting cycle #10.   MEDICAL HISTORY: Past Medical History:  Diagnosis Date   Adenocarcinoma of right lung, stage 4 ([facility]) 08/26/2021   Anemia    has sickle cell trait   Atherosclerotic heart disease of native coronary artery with angina pectoris ([facility]) 05/2012, 10/2013   a.  s/p PCTA to dRCA and ostial RPAV-PLA vessel + PDA branch (05/2012)  b. [place] s/p DES to mLAD - Resolute DES 3.0 x 22 (3.105m -->3.3 mm)   Bipolar depression ([facility]    Breast abscess    a. right side.    COPD (chronic obstructive pulmonary disease) (HCC)    Depression    Diabetic peripheral neuropathy (HCC)    Fibromyalgia    Genital warts    GERD (gastroesophageal reflux disease)    History of hiatal hernia    HLD (hyperlipidemia)    Hypertension    Pain in limb    a. LE VENOUS DUPLEX, 02/05/2009 - no evidence of deep vein thrombosis, Baker's cyst   Pneumonia    PTSD (post-traumatic stress disorder)    Sickle cell trait (HCC)    Tobacco abuse    Tooth caries    Type II diabetes mellitus (HCC)     ALLERGIES:  is allergic to amoxil [amoxicillin], chantix [varenicline], suboxone [buprenorphine hcl-naloxone  hcl], and emend [fosaprepitant dimeglumine].  MEDICATIONS:  Current Outpatient Medications  Medication Sig Dispense Refill   hyoscyamine (LEVSIN SL) 0.125 MG SL tablet Place 1 tablet (0.125 mg total) under the tongue every 6 (six) hours as needed. For abdominal cramping 30 tablet 0   aspirin EC 81 MG tablet Take 81 mg by mouth daily.     atorvastatin (LIPITOR) 40 MG tablet TAKE 1 TABLET (40 MG TOTAL) BY MOUTH DAILY. 90 tablet 3   busPIRone (BUSPAR) 15 MG tablet TAKE 1/2 TABLET (7.5 MG TOTAL) BY MOUTH TWICE DAILY (Patient taking differently: Take 7.5 mg by mouth 2 (two) times daily.) 30 tablet 11   Carboxymethylcellul-Glycerin (LUBRICATING EYE DROPS OP) Place 1 drop into both eyes daily as needed (dry eyes).     carvedilol (COREG) 12.5 MG tablet TAKE 1 TABLET (12.5 MG TOTAL) BY MOUTH 2 (TWO) TIMES  DAILY WITH A MEAL. 180 tablet 1   diclofenac Sodium (VOLTAREN) 1 % GEL Apply 1 application topically 3 times daily as needed for pain 2 g 3   ferrous sulfate 325 (65 FE) MG tablet Take 325 mg by mouth daily with breakfast.     fluticasone (FLONASE) 50 MCG/ACT nasal spray PLACE 1 SPRAY INTO BOTH NOSTRILS DAILY AS NEEDED (CONGESTION). 16 g 1   fluticasone-salmeterol (ADVAIR) 100-50 MCG/ACT AEPB Inhale 1 puff into the lungs 2 (two) times daily. 1 each 3   folic acid (FOLVITE) 1 MG tablet TAKE 1 TABLET (1 MG TOTAL) BY MOUTH DAILY. 30 tablet 3   furosemide (LASIX) 40 MG tablet Take 1 tablet (40 mg total) by mouth daily as needed. If you gain 3 lbs in 24 hours or 5 lbs in 1 week 30 tablet 1   glucose blood (ACCU-CHEK AVIVA PLUS) test strip Use as instructed 50 each 1   insulin detemir (LEVEMIR FLEXPEN) 100 UNIT/ML FlexPen Inject 18 Units into the skin 2 (two) times daily. 15 mL 1   Insulin Pen Needle 32G X 4 MM MISC 1 Needle by Does not apply route in the morning and at bedtime. 120 each 2   ipratropium (ATROVENT HFA) 17 MCG/ACT inhaler Inhale 2 puffs into the lungs every 4 (four) hours as needed for wheezing  (COPD).     lidocaine-prilocaine (EMLA) cream APPLY 1 APPLICATION TOPICALLY AS NEEDED. 30 g 2   nicotine polacrilex (NICORETTE) 2 MG gum Take 1 each (2 mg total) by mouth as needed for smoking cessation. 100 tablet 0   nitroGLYCERIN (NITROSTAT) 0.4 MG SL tablet Place 1 tablet (0.4 mg total) under the tongue every 5 (five) minutes as needed for chest pain. 25 tablet 5   nystatin (MYCOSTATIN) 100000 UNIT/ML suspension Take 5 mLs (500,000 Units total) by mouth 4 (four) times daily. 60 mL 0   ondansetron (ZOFRAN-ODT) 8 MG disintegrating tablet Take 1 tablet (8 mg total) by mouth every 8 (eight) hours as needed for nausea or vomiting. (Patient not taking: Reported on 02/02/2022) 30 tablet 2   oxyCODONE (OXY IR/ROXICODONE) 5 MG immediate release tablet Take 1-2 tablets (5-10 mg total) by mouth every 6 (six) hours as needed for severe pain. 90 tablet 0   oxyCODONE ER (XTAMPZA ER) 9 MG C12A Take 9 mg by mouth every 12 (twelve) hours. 60 capsule 0   pantoprazole (PROTONIX) 40 MG tablet TAKE 1 TABLET (40 MG TOTAL) BY MOUTH DAILY. 30 tablet 1   polyethylene glycol powder (GLYCOLAX/MIRALAX) 17 GM/SCOOP powder Take 17 g by mouth daily as needed for mild constipation or moderate constipation. 3350 g 1   potassium chloride SA (KLOR-CON M) 20 MEQ tablet Take 1 tablet (20 mEq total) by mouth daily as needed. On days you take Lasix 30 tablet 1   prochlorperazine (COMPAZINE) 10 MG tablet Take 1 tablet (10 mg total) by mouth every 6 (six) hours as needed for nausea or vomiting. 30 tablet 0   sertraline (ZOLOFT) 50 MG tablet TAKE 1 TABLET (50 MG TOTAL) BY MOUTH DAILY. 30 tablet 3   valACYclovir (VALTREX) 1000 MG tablet TAKE FOR 3 DAYS AS NEEDED FOR EACH OUTBREAK 30 tablet 11   vitamin B-12 (CYANOCOBALAMIN) 1000 MCG tablet Take 1,000 mcg by mouth daily.     No current facility-administered medications for this visit.   Facility-Administered Medications Ordered in Other Visits  Medication Dose Route Frequency Provider  Last Rate Last Admin   heparin lock flush 100 unit/mL  500 Units Intracatheter Once PRN Curt Bears, MD       pembrolizumab [facility]) 200 mg in sodium chloride 0.9 % 50 mL chemo infusion  200 mg Intravenous Once Curt Bears, MD 116 mL/hr at 03/10/22 1310 200 mg at 03/10/22 1310   PEMEtrexed (ALIMTA) 800 mg in sodium chloride 0.9 % 100 mL chemo infusion  400 mg/m2 (Treatment Plan Recorded) Intravenous Once Curt Bears, MD       sodium chloride flush (NS) 0.9 % injection 10 mL  10 mL Intracatheter PRN Curt Bears, MD        SURGICAL HISTORY:  Past Surgical History:  Procedure Laterality Date   [place]   "TVT"   BLADDER SURGERY  2003   "TVT"   BREAST EXCISIONAL BIOPSY     BREAST SURGERY Right    I&D for multiple abscesses   CARDIAC CATHETERIZATION     CORONARY ARTERY BYPASS GRAFT N/A 06/30/2021   Procedure: CORONARY ARTERY BYPASS GRAFTING (CABG) x3 USING LEFT INTERNAL MAMMARY ARTERY,  RIGHT RADIAL ARTERY AND LEFT GREATER SAPHENOUS VEIN;  Surgeon: Lajuana Matte, MD;  Location: [facility];  Service: Open Heart Surgery;  Laterality: N/A;   cutting balloon     ENDOVEIN HARVEST OF GREATER SAPHENOUS VEIN Left 06/30/2021   Procedure: ENDOVEIN HARVEST OF GREATER SAPHENOUS VEIN;  Surgeon: Lajuana Matte, MD;  Location: [facility];  Service: Open Heart Surgery;  Laterality: Left;   EYE SURGERY Left    laser surgery   FINE NEEDLE ASPIRATION  08/21/2021   Procedure: FINE NEEDLE ASPIRATION (FNA) LINEAR;  Surgeon: Garner Nash, DO;  Location: [facility];  Service: Pulmonary;;   INCISE AND DRAIN ABCESS  ; 04/26/11; 08/13/11   right breast   INCISION AND DRAINAGE ABSCESS Right 05/09/2012   Procedure: INCISION AND DRAINAGE RIGHT BREAST ABSCESS;  Surgeon: Imogene Burn. Georgette Dover, MD;  Location: [facility];  Service: General;  Laterality: Right;   INCISION AND DRAINAGE ABSCESS Right 02/08/2014   Procedure: INCISION AND DRAINAGE RIGHT BREAST ABSCESS;  Surgeon: Donnie Mesa, MD;   Location: [facility];  Service: General;  Laterality: Right;   INCISION AND DRAINAGE ABSCESS Right 06/14/2014   Procedure: INCISION AND DRAINAGE RIGHT BREAST ABSCESS;  Surgeon: Donnie Mesa, MD;  Location: [facility];  Service: General;  Laterality: Right;   IR IMAGING GUIDED PORT INSERTION  12/08/2021   IRRIGATION AND DEBRIDEMENT ABSCESS  12/19/2010   Procedure: IRRIGATION AND DEBRIDEMENT ABSCESS;  Surgeon: Rolm Bookbinder, MD;  Location: [facility];  Service: General;  Laterality: Right;   IRRIGATION AND DEBRIDEMENT ABSCESS  08/13/2011   Procedure: MINOR INCISION AND DRAINAGE OF ABSCESS;  Surgeon: Gwenyth Ober, MD;  Location: [facility];  Service: General;  Laterality: Right;  Right Breast    IRRIGATION AND DEBRIDEMENT ABSCESS  11/17/2011   Procedure: IRRIGATION AND DEBRIDEMENT ABSCESS;  Surgeon: Imogene Burn. Georgette Dover, MD;  Location: [facility];  Service: General;  Laterality: Right;  irrigation and debridement right recurrent breast abscess   LARYNX SURGERY     LEFT HEART CATH AND CORONARY ANGIOGRAPHY N/A 04/09/2021   Procedure: LEFT HEART CATH AND CORONARY ANGIOGRAPHY;  Surgeon: Jettie Booze, MD;  Location: [facility];  Service: Cardiovascular;  Laterality: N/A;   LEFT HEART CATHETERIZATION WITH CORONARY ANGIOGRAM N/A 05/29/2012   Procedure: LEFT HEART CATHETERIZATION WITH CORONARY ANGIOGRAM & PTCA;  Surgeon: Troy Sine, MD;  Location: [facility];  Service: Cardiovascular; Bifurcation dRCA-RPAD/PLA & rPDA  PTCA.   LEFT HEART CATHETERIZATION  WITH CORONARY ANGIOGRAM N/A 11/08/2013   Procedure: LEFT HEART CATHETERIZATION WITH CORONARY ANGIOGRAM and Coronary Stent Intervention;  Surgeon: Leonie Man, MD;  Location: [facility] CATH LAB;  Service: Cardiovascular; mLAD 80% (FFR 0.73) - resolute DES 3.0 x 22 mm (postdilated to 3.3 mm->3.5 mm)   NM MYOVIEW LTD  01/26/2009   Normal study, no evidence of ischemia, EF 67%   ployp removed from voice box 03/30/12     RADIAL ARTERY HARVEST Right 06/30/2021    Procedure: RADIAL ARTERY HARVEST;  Surgeon: Lajuana Matte, MD;  Location: [facility];  Service: Open Heart Surgery;  Laterality: Right;   REFRACTIVE SURGERY  ~ 2010   right   TEE WITHOUT CARDIOVERSION N/A 06/30/2021   Procedure: TRANSESOPHAGEAL ECHOCARDIOGRAM (TEE);  Surgeon: Lajuana Matte, MD;  Location: [facility];  Service: Open Heart Surgery;  Laterality: N/A;   TONSILLECTOMY  1988   VIDEO BRONCHOSCOPY WITH ENDOBRONCHIAL ULTRASOUND Bilateral 08/21/2021   Procedure: VIDEO BRONCHOSCOPY WITH ENDOBRONCHIAL ULTRASOUND;  Surgeon: Garner Nash, DO;  Location: [facility];  Service: Pulmonary;  Laterality: Bilateral;    REVIEW OF SYSTEMS:   Constitutional: Positive for stable fatigue.  Positive for decreased appetite and weight loss.  Negative for chills and fever.  HENT: Positive for nasal congestion and postnasal drainage.  Negative for mouth sores, nosebleeds, or sore throat.  Eyes: Negative for eye problems and icterus.  Respiratory: Positive for stable mild cough in the AM. Negative for hemoptysis, shortness of breath and wheezing.   Cardiovascular: Negative for chest pain and leg swelling.  Gastrointestinal: Positive for intermittent nausea and vomiting.  Positive for occasional constipation. Negative for abdominal pain,  diarrhea, and vomiting.  Genitourinary: Negative for bladder incontinence, frequency and hematuria.   Musculoskeletal: Positive for chronic pain. Negative for gait problem, neck pain and neck stiffness.  Skin: Negative for itching and rash.  Neurological: Positive for migraines.  Negative for dizziness, extremity weakness, gait problem, light-headedness and seizures.  Hematological: Negative for adenopathy. Does not bruise/bleed easily.  Psychiatric/Behavioral: Positive for depression.  Negative for confusion and sleep disturbance. The patient is not nervous/anxious   PHYSICAL EXAMINATION:  Blood pressure (!) 149/94, pulse 88, temperature 98.3 F (36.8 C), resp.  rate 20, weight 203 lb 12.8 oz (92.4 kg), last menstrual period 06/12/2016, SpO2 99 %.  ECOG PERFORMANCE STATUS: 1  Physical Exam  Constitutional: Oriented to person, place, and time and well-developed, well-nourished, and in no distress.  HENT:  Head: Normocephalic and atraumatic.  Mouth/Throat: Oropharynx is clear and moist. No oropharyngeal exudate.  Eyes: Conjunctivae are normal. Right eye exhibits no discharge. Left eye exhibits no discharge. No scleral icterus.  Neck: Normal range of motion. Neck supple.  Cardiovascular: Normal rate, regular rhythm, normal heart sounds and intact distal pulses.   Pulmonary/Chest: Effort normal and breath sounds normal. No respiratory distress. No wheezes. No rales.  Abdominal: Soft. Bowel sounds are normal. Exhibits no distension and no mass. There is no tenderness.  Musculoskeletal: Normal range of motion. Exhibits no edema.  Lymphadenopathy:    No cervical adenopathy.  Neurological: Alert and oriented to person, place, and time. Exhibits normal muscle tone. Gait normal. Coordination normal.  Skin: Skin is warm and dry. No rash noted. Not diaphoretic. No erythema. No pallor.  Psychiatric: Positive for depressed mood. Positive for memory and judgment normal.  Vitals reviewed.  LABORATORY DATA: Lab Results  Component Value Date   WBC 4.6 03/10/2022   HGB 10.6 (L) 03/10/2022   HCT 31.1 (L) 03/10/2022  MCV 99.7 03/10/2022   PLT 282 03/10/2022      Chemistry      Component Value Date/Time   NA 141 03/10/2022 1138   NA 140 04/03/2021 1059   K 3.7 03/10/2022 1138   CL 108 03/10/2022 1138   CO2 28 03/10/2022 1138   BUN 8 03/10/2022 1138   BUN 13 04/03/2021 1059   CREATININE 0.75 03/10/2022 1138   CREATININE 0.65 11/06/2013 1520      Component Value Date/Time   CALCIUM 8.8 (L) 03/10/2022 1138   ALKPHOS 68 03/10/2022 1138   AST 34 03/10/2022 1138   ALT 41 03/10/2022 1138   BILITOT 0.4 03/10/2022 1138       RADIOGRAPHIC  STUDIES:  No results found.   ASSESSMENT/PLAN:  This is a very pleasant 57 year old African-American female diagnosed with stage IV (T2 a, N2, M1 C) non-small cell lung cancer, adenocarcinoma.  The patient presented with a right upper lobe lung mass in addition to right hilar and mediastinal lymphadenopathy.  She also has innumerable bilateral pulmonary nodules, metastatic bone, liver, and adrenal lesions.  She was diagnosed in August 2023.  She is positive for K-ras G12 C mutation and her PD-L1 expression is 89%    The patient completed  palliative radiotherapy to the metastatic bone lesions at L4 and the sacrum under the care of Dr. Lisbeth Renshaw.  The last day radiation is tentatively scheduled for 10/22/21.    The patient is currently undergoing palliative systemic chemotherapy with carboplatin for an AUC of 5, Alimta 500 mg per metered square, Keytruda 200 mg IV every 3 weeks.  She is status post 9 cycles.  Starting from cycle #5 the patient started maintenance Alimta and Keytruda.   The patient and I had a discussion about quality of life today.  We discussed several options. Discussed removing Alimta from her care plan and proceeding on single agent Keytruda due to intolerance to chemotherapy. We also discussed reducing the dose of Alimta vs keeping the dose the same. She expressed concern with removing Alimta completely due to having a positive response to treatment on prior imaging. After our discussion, she would like to proceed with cycle #10 today with slightly dose reduced alimta to 400 mg/m2 and [place]. I asked her to think of the scenarios again for her next appointment.   She is expected to have a restaging CT scan prior to her next appointment.   I have reached out to counselor and chaplin but they are not in the office today. The patient agreed to referral to psychiatry/psychologist for her depression.   For her headaches/migraines, she would be interested in referral to neurology.     We will see her back for follow-up visit in 3 weeks for evaluation and repeat blood work before undergoing the next cycle of treatment with cycle #11   She will continue to follow with palliative care.  He is scheduled to see them today.     She will continue with compazine and sublingual zofran for nausea/vomiting. I discussed with her she should not take her pain medications on an empty stomach. This may be contributing to her throwing up her medications in the AM. I recommended she take her sublingual zofran before taking her other medications to see if this helps prevent nausea/vomiting.   For her nasal congestion, I encouraged her to take an antihistamine in additional to flonase and mucinex. She often spits up thick phlegm from her post nasal drainage.   She would be  interested in trying hyoscyamine for abdominal cramping.   We also re-discussed support groups and the benefits of this. She also was previously given resources for the other events/activities that are offered here.   She is overdue for a mammogram. If she has some ongoing concerns related to her breast, discussed she is ok to undergo mammogram.   The patient was advised to call immediately if she has any concerning symptoms in the interval. The patient voices understanding of current disease status and treatment options and is in agreement with the current care plan. All questions were answered. The patient knows to call the clinic with any problems, questions or concerns. We can certainly see the patient much sooner if necessary  Orders Placed This Encounter  Procedures   CT Chest W Contrast    Standing Status:   Future    Standing Expiration Date:   03/10/2023    Order Specific Question:   If indicated for the ordered procedure, I authorize the administration of contrast media per Radiology protocol    Answer:   Yes    Order Specific Question:   Does the patient have a contrast media/X-ray dye allergy?    Answer:    No    Order Specific Question:   Is patient pregnant?    Answer:   No    Order Specific Question:   Preferred imaging location?    Answer:   [facility]   CT Abdomen Pelvis W Contrast    Standing Status:   Future    Standing Expiration Date:   03/10/2023    Order Specific Question:   If indicated for the ordered procedure, I authorize the administration of contrast media per Radiology protocol    Answer:   Yes    Order Specific Question:   Does the patient have a contrast media/X-ray dye allergy?    Answer:   No    Order Specific Question:   Is patient pregnant?    Answer:   No    Order Specific Question:   Preferred imaging location?    Answer:   [facility]    Order Specific Question:   Is Oral Contrast requested for this exam?    Answer:   Yes, Per Radiology protocol   Ambulatory referral to Neurology    Referral Priority:   Routine    Referral Type:   Consultation    Referral Reason:   Specialty Services Required    Requested Specialty:   Neurology    Number of Visits Requested:   1   Ambulatory referral to Psychology    Referral Priority:   Routine    Referral Type:   Psychiatric    Referral Reason:   Specialty Services Required    Requested Specialty:   Psychology    Number of Visits Requested:   1     The total time spent in the appointment was 30-39 minutes.   Cassidy [name] L Merinda Victorino, PA-C 03/10/22

## 2022-03-10 ENCOUNTER — Inpatient Hospital Stay (HOSPITAL_BASED_OUTPATIENT_CLINIC_OR_DEPARTMENT_OTHER): Payer: Medicaid Other | Admitting: Physician Assistant

## 2022-03-10 ENCOUNTER — Inpatient Hospital Stay (HOSPITAL_BASED_OUTPATIENT_CLINIC_OR_DEPARTMENT_OTHER): Payer: Medicaid Other | Admitting: Nurse Practitioner

## 2022-03-10 ENCOUNTER — Inpatient Hospital Stay: Payer: Medicaid Other

## 2022-03-10 ENCOUNTER — Encounter: Payer: Self-pay | Admitting: Nurse Practitioner

## 2022-03-10 ENCOUNTER — Encounter: Payer: Self-pay | Admitting: Internal Medicine

## 2022-03-10 ENCOUNTER — Other Ambulatory Visit: Payer: Self-pay | Admitting: Physician Assistant

## 2022-03-10 ENCOUNTER — Telehealth: Payer: Self-pay | Admitting: Medical Oncology

## 2022-03-10 ENCOUNTER — Other Ambulatory Visit: Payer: Self-pay

## 2022-03-10 VITALS — BP 123/84

## 2022-03-10 VITALS — BP 149/94 | HR 88 | Temp 98.3°F | Resp 20 | Wt 203.8 lb

## 2022-03-10 DIAGNOSIS — F32A Depression, unspecified: Secondary | ICD-10-CM | POA: Diagnosis not present

## 2022-03-10 DIAGNOSIS — Z5111 Encounter for antineoplastic chemotherapy: Secondary | ICD-10-CM | POA: Diagnosis not present

## 2022-03-10 DIAGNOSIS — C3491 Malignant neoplasm of unspecified part of right bronchus or lung: Secondary | ICD-10-CM

## 2022-03-10 DIAGNOSIS — G893 Neoplasm related pain (acute) (chronic): Secondary | ICD-10-CM

## 2022-03-10 DIAGNOSIS — M792 Neuralgia and neuritis, unspecified: Secondary | ICD-10-CM

## 2022-03-10 DIAGNOSIS — R110 Nausea: Secondary | ICD-10-CM

## 2022-03-10 DIAGNOSIS — Z7189 Other specified counseling: Secondary | ICD-10-CM

## 2022-03-10 DIAGNOSIS — Z95828 Presence of other vascular implants and grafts: Secondary | ICD-10-CM

## 2022-03-10 DIAGNOSIS — R4589 Other symptoms and signs involving emotional state: Secondary | ICD-10-CM

## 2022-03-10 DIAGNOSIS — R519 Headache, unspecified: Secondary | ICD-10-CM

## 2022-03-10 DIAGNOSIS — R530 Neoplastic (malignant) related fatigue: Secondary | ICD-10-CM | POA: Diagnosis not present

## 2022-03-10 DIAGNOSIS — Z515 Encounter for palliative care: Secondary | ICD-10-CM

## 2022-03-10 DIAGNOSIS — R630 Anorexia: Secondary | ICD-10-CM | POA: Diagnosis not present

## 2022-03-10 DIAGNOSIS — F419 Anxiety disorder, unspecified: Secondary | ICD-10-CM | POA: Diagnosis not present

## 2022-03-10 DIAGNOSIS — R109 Unspecified abdominal pain: Secondary | ICD-10-CM

## 2022-03-10 DIAGNOSIS — Z5112 Encounter for antineoplastic immunotherapy: Secondary | ICD-10-CM

## 2022-03-10 LAB — CMP (CANCER CENTER ONLY)
ALT: 41 U/L (ref 0–44)
AST: 34 U/L (ref 15–41)
Albumin: 3.8 g/dL (ref 3.5–5.0)
Alkaline Phosphatase: 68 U/L (ref 38–126)
Anion gap: 5 (ref 5–15)
BUN: 8 mg/dL (ref 6–20)
CO2: 28 mmol/L (ref 22–32)
Calcium: 8.8 mg/dL — ABNORMAL LOW (ref 8.9–10.3)
Chloride: 108 mmol/L (ref 98–111)
Creatinine: 0.75 mg/dL (ref 0.44–1.00)
GFR, Estimated: >60 mL/min (ref >60)
Glucose, Bld: 108 mg/dL — ABNORMAL HIGH (ref 70–99)
Potassium: 3.7 mmol/L (ref 3.5–5.1)
Sodium: 141 mmol/L (ref 135–145)
Total Bilirubin: 0.4 mg/dL (ref 0.3–1.2)
Total Protein: 6.4 g/dL — ABNORMAL LOW (ref 6.5–8.1)

## 2022-03-10 LAB — CBC WITH DIFFERENTIAL/PLATELET
Abs Immature Granulocytes: 0.01 10*3/uL (ref 0.00–0.07)
Basophils Absolute: 0 10*3/uL (ref 0.0–0.1)
Basophils Relative: 0 %
Eosinophils Absolute: 0.1 10*3/uL (ref 0.0–0.5)
Eosinophils Relative: 1 %
HCT: 31.1 % — ABNORMAL LOW (ref 36.0–46.0)
Hemoglobin: 10.6 g/dL — ABNORMAL LOW (ref 12.0–15.0)
Immature Granulocytes: 0 %
Lymphocytes Relative: 21 %
Lymphs Abs: 1 10*3/uL (ref 0.7–4.0)
MCH: 34 pg (ref 26.0–34.0)
MCHC: 34.1 g/dL (ref 30.0–36.0)
MCV: 99.7 fL (ref 80.0–100.0)
Monocytes Absolute: 0.6 10*3/uL (ref 0.1–1.0)
Monocytes Relative: 13 %
Neutro Abs: 2.9 10*3/uL (ref 1.7–7.7)
Neutrophils Relative %: 65 %
Platelets: 282 10*3/uL (ref 150–400)
RBC: 3.12 MIL/uL — ABNORMAL LOW (ref 3.87–5.11)
RDW: 14.5 % (ref 11.5–15.5)
WBC: 4.6 10*3/uL (ref 4.0–10.5)
nRBC: 0 % (ref 0.0–0.2)

## 2022-03-10 MED ORDER — HEPARIN SOD (PORK) LOCK FLUSH 100 UNIT/ML IV SOLN
500.0000 [IU] | Freq: Once | INTRAVENOUS | Status: AC | PRN
  Administered 2022-03-10: 500 [IU]

## 2022-03-10 MED ORDER — XTAMPZA ER 13.5 MG PO C12A: 13.5000 mg | EXTENDED_RELEASE_CAPSULE | Freq: Two times a day (BID) | ORAL | 0 refills | Status: DC

## 2022-03-10 MED ORDER — HYOSCYAMINE SULFATE 0.125 MG SL SUBL: 0.1250 mg | SUBLINGUAL_TABLET | Freq: Four times a day (QID) | SUBLINGUAL | 0 refills | Status: DC | PRN

## 2022-03-10 MED ORDER — LACTULOSE 10 GM/15ML PO SOLN: 20.0000 g | Freq: Two times a day (BID) | ORAL | 0 refills | Status: DC | PRN

## 2022-03-10 MED ORDER — SODIUM CHLORIDE 0.9% FLUSH
10.0000 mL | Freq: Once | INTRAVENOUS | Status: AC
  Administered 2022-03-10: 10 mL

## 2022-03-10 MED ORDER — SODIUM CHLORIDE 0.9 % IV SOLN
400.0000 mg/m2 | Freq: Once | INTRAVENOUS | Status: AC
  Administered 2022-03-10: 800 mg via INTRAVENOUS
  Filled 2022-03-10: qty 20

## 2022-03-10 MED ORDER — SODIUM CHLORIDE 0.9 % IV SOLN
200.0000 mg | Freq: Once | INTRAVENOUS | Status: AC
  Administered 2022-03-10: 200 mg via INTRAVENOUS
  Filled 2022-03-10: qty 8

## 2022-03-10 MED ORDER — OXYCODONE HCL 5 MG PO TABS: 5.0000 mg | ORAL_TABLET | Freq: Four times a day (QID) | ORAL | 0 refills | Status: DC | PRN

## 2022-03-10 MED ORDER — SODIUM CHLORIDE 0.9% FLUSH
10.0000 mL | INTRAVENOUS | Status: DC | PRN
  Administered 2022-03-10: 10 mL

## 2022-03-10 MED ORDER — LORAZEPAM 0.5 MG PO TABS: 0.5000 mg | ORAL_TABLET | Freq: Three times a day (TID) | ORAL | 0 refills | Status: DC | PRN

## 2022-03-10 MED ORDER — SODIUM CHLORIDE 0.9 % IV SOLN: Freq: Once | INTRAVENOUS | Status: AC

## 2022-03-10 MED ORDER — PROCHLORPERAZINE MALEATE 10 MG PO TABS
10.0000 mg | ORAL_TABLET | Freq: Once | ORAL | Status: AC
  Administered 2022-03-10: 10 mg via ORAL
  Filled 2022-03-10: qty 1

## 2022-03-10 NOTE — Telephone Encounter (Signed)
[facility] called and said the referral went to [facility] and " It looks like they are working on it".

## 2022-03-10 NOTE — Progress Notes (Signed)
[facility]  [phone] [phone]   Name: Cassidy [name] Date: 03/10/2022 MRN: [id number]  DOB: 06-06-1965  Cassidy [name] Care Team: [name], DO as PCP - General (Family Medicine) O'Neal, Cassie Freer, MD as PCP - Cardiology (Cardiology) Rolm Bookbinder, MD as Consulting Physician (General Surgery)    INTERVAL HISTORY: Cassidy [name] is a 57 y.o. 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  Palliative ask to see for symptom management and goals of care.   SOCIAL HISTORY:     reports that she has been smoking cigarettes. She started smoking about 43 years ago. She has a 21.00 pack-year smoking history. She has never used smokeless tobacco. She reports that she does not currently use drugs. She reports that she does not drink alcohol.  ADVANCE DIRECTIVES:    CODE STATUS:   PAST MEDICAL HISTORY: Past Medical History:  Diagnosis Date   Adenocarcinoma of right lung, stage 4 ([place]) 08/26/2021   Anemia    has sickle cell trait   Atherosclerotic heart disease of native coronary artery with angina pectoris ([place]) 05/2012, 10/2013   a.  s/p PCTA to dRCA and ostial RPAV-PLA vessel + PDA branch (05/2012)  b. [place] s/p DES to mLAD - Resolute DES 3.0 x 22 (3.86m -->3.3 mm)   Bipolar depression ([facility]    Breast abscess    a. right side.    COPD (chronic obstructive pulmonary disease) (HCC)    Depression    Diabetic peripheral neuropathy (HCC)    Fibromyalgia    Genital warts    GERD (gastroesophageal reflux disease)    History of hiatal hernia    HLD (hyperlipidemia)    Hypertension    Pain in limb    a. LE VENOUS DUPLEX, 02/05/2009 - no evidence of deep vein thrombosis, Baker's cyst   Pneumonia    PTSD (post-traumatic stress disorder)    Sickle cell  trait (HCC)    Tobacco abuse    Tooth caries    Type II diabetes mellitus (HCC)     ALLERGIES:  is allergic to amoxil [amoxicillin], chantix [varenicline], suboxone [buprenorphine hcl-naloxone hcl], and emend [fosaprepitant dimeglumine].  MEDICATIONS:  Current Outpatient Medications  Medication Sig Dispense Refill   aspirin EC 81 MG tablet Take 81 mg by mouth daily.     atorvastatin (LIPITOR) 40 MG tablet TAKE 1 TABLET (40 MG TOTAL) BY MOUTH DAILY. 90 tablet 3   busPIRone (BUSPAR) 15 MG tablet TAKE 1/2 TABLET (7.5 MG TOTAL) BY MOUTH TWICE DAILY (Cassidy [name] taking differently: Take 7.5 mg by mouth 2 (two) times daily.) 30 tablet 11   Carboxymethylcellul-Glycerin (LUBRICATING EYE DROPS OP) Place 1 drop into both eyes daily as needed (dry eyes).     carvedilol (COREG) 12.5 MG tablet TAKE 1 TABLET (12.5 MG TOTAL) BY MOUTH 2 (TWO) TIMES DAILY WITH A MEAL. 180 tablet 1   diclofenac Sodium (VOLTAREN) 1 % GEL Apply 1 application topically 3 times daily as needed for pain 2 g 3   ferrous sulfate 325 (65 FE) MG tablet Take 325 mg by mouth daily with breakfast.     fluticasone (FLONASE) 50 MCG/ACT nasal spray PLACE 1 SPRAY INTO BOTH NOSTRILS DAILY AS NEEDED (CONGESTION). 16 g 1   fluticasone-salmeterol (ADVAIR) 100-50 MCG/ACT AEPB Inhale 1 puff into the lungs 2 (two) times daily. 1 each 3   folic  acid (FOLVITE) 1 MG tablet TAKE 1 TABLET (1 MG TOTAL) BY MOUTH DAILY. 30 tablet 3   furosemide (LASIX) 40 MG tablet Take 1 tablet (40 mg total) by mouth daily as needed. If you gain 3 lbs in 24 hours or 5 lbs in 1 week 30 tablet 1   glucose blood (ACCU-CHEK AVIVA PLUS) test strip Use as instructed 50 each 1   insulin detemir (LEVEMIR FLEXPEN) 100 UNIT/ML FlexPen Inject 18 Units into the skin 2 (two) times daily. 15 mL 1   Insulin Pen Needle 32G X 4 MM MISC 1 Needle by Does not apply route in the morning and at bedtime. 120 each 2   ipratropium (ATROVENT HFA) 17 MCG/ACT inhaler Inhale 2 puffs into the lungs every  4 (four) hours as needed for wheezing (COPD).     lidocaine-prilocaine (EMLA) cream APPLY 1 APPLICATION TOPICALLY AS NEEDED. 30 g 2   nicotine polacrilex (NICORETTE) 2 MG gum Take 1 each (2 mg total) by mouth as needed for smoking cessation. 100 tablet 0   nitroGLYCERIN (NITROSTAT) 0.4 MG SL tablet Place 1 tablet (0.4 mg total) under the tongue every 5 (five) minutes as needed for chest pain. 25 tablet 5   nystatin (MYCOSTATIN) 100000 UNIT/ML suspension Take 5 mLs (500,000 Units total) by mouth 4 (four) times daily. 60 mL 0   ondansetron (ZOFRAN-ODT) 8 MG disintegrating tablet Take 1 tablet (8 mg total) by mouth every 8 (eight) hours as needed for nausea or vomiting. (Cassidy [name] not taking: Reported on 02/02/2022) 30 tablet 2   oxyCODONE (OXY IR/ROXICODONE) 5 MG immediate release tablet Take 1-2 tablets (5-10 mg total) by mouth every 6 (six) hours as needed for severe pain. 90 tablet 0   oxyCODONE ER (XTAMPZA ER) 9 MG C12A Take 9 mg by mouth every 12 (twelve) hours. 60 capsule 0   pantoprazole (PROTONIX) 40 MG tablet TAKE 1 TABLET (40 MG TOTAL) BY MOUTH DAILY. 30 tablet 1   polyethylene glycol powder (GLYCOLAX/MIRALAX) 17 GM/SCOOP powder Take 17 g by mouth daily as needed for mild constipation or moderate constipation. 3350 g 1   potassium chloride SA (KLOR-CON M) 20 MEQ tablet Take 1 tablet (20 mEq total) by mouth daily as needed. On days you take Lasix 30 tablet 1   prochlorperazine (COMPAZINE) 10 MG tablet Take 1 tablet (10 mg total) by mouth every 6 (six) hours as needed for nausea or vomiting. 30 tablet 0   sertraline (ZOLOFT) 50 MG tablet TAKE 1 TABLET (50 MG TOTAL) BY MOUTH DAILY. 30 tablet 3   valACYclovir (VALTREX) 1000 MG tablet TAKE FOR 3 DAYS AS NEEDED FOR EACH OUTBREAK 30 tablet 11   vitamin B-12 (CYANOCOBALAMIN) 1000 MCG tablet Take 1,000 mcg by mouth daily.     No current facility-administered medications for this visit.    VITAL SIGNS: LMP 06/12/2016 (Approximate)  There were no  vitals filed for this visit.  Estimated body mass index is 32.48 kg/m as calculated from the following:   Height as of 01/06/22: 5' 7.5" (1.715 m).   Weight as of 02/17/22: 210 lb 8 oz (95.5 kg).   PERFORMANCE STATUS (ECOG) : 1 - Symptomatic but completely ambulatory   Physical Exam General: NAD Pulmonary: normal breathing pattern  Extremities: no edema, no joint deformities Skin: no rashes Neurological: AAO x3, mood appropriate   IMPRESSION:  I saw Cassidy [name] during infusion. Sitting in recliner. Cassidy [name] is emotional sharing she is dealing with some depression and ongoing symptoms. Emotional support provide.d  Neoplasm related pain Cassidy [name] shares her pain has recently increased. Reports pain in hips and legs in addition to neuropathic pain/discomfort. She has not left her home in more than a week due to pain.   We discussed her current regimen at length. She is taking Xtampza '9mg'$  every 12 hours and Oxycodone as needed for breakthrough pain. Education provided on use of medication given recent increase in pain. We will increase her Xtampza to 13 mg every 12 hours.   She is being referred to Neurologist for additional support and neuropathic symptoms. In the interim Cassidy [name] was previously on gabapentin '600mg'$  however has not been taken. Advised to restart. She has pills on hand and denies need for new prescription at this time.   Cassidy [name] is requesting a walker for additional mobility support. Order will be placed as well as donut cushion.   We will continue to closely monitor and adjust medications accordingly.   2. Constipation Cassidy [name] continues to struggle with constipation. Reports drinking prune juice and oatmeal to assist with bowels. She has been taking Miralax and Senna-S however with minimal response recently.   We discussed use of linzess however Cassidy [name] has tried in the past and medication was to strong. Education provided on use of lactulose as needed.   3. Nausea Cassidy [name]  reports ongoing nausea. She is taking compazine and Zofran alternating however continues to struggle. Reports increase in symptoms days after infusion. Also some indigestion. She endorses taking Protonix as prescribed.   We discussed use of ativan as needed for additional support.   4. Goals of care  We discussed Cassidy [name]'s disease trajectory and current quality of life. Cassidy [name] is tearful expressing although her treatments are allowing for stability her symptoms are decreasing her quality of life. She is not able to go out and enjoy time with her family. She is contemplating discontinuing treatment and focus on her quality of life.   I created space and opportunity for Cassidy [name] to openly discuss her feelings and thoughts. We discussed aggressively focusing on treatment with hopes of improvement in her quality of life. She acknowledges some depression as "being sick with cancer" has now become more of a reality. Emotional support provided. She is in the process of establishing support with a new counselor for emotional support.   Cassidy [name] understands we are here for ongoing support and navigating thru her health journey.   I discussed the importance of continued conversation with family and their medical providers regarding overall plan of care and treatment options, ensuring decisions are within the context of the patients values and GOCs.  PLAN: Xtampza 13 mg every 12 hours Oxy IR 5-10 mg every 6 hours as needed for breakthrough pain Lactulose for bowel regimen. Unable to tolerate Linzess. Senna 2 tabs twice daily  Zofran, compazine, and ativan as needed for nausea Ongoing emotional support and symptom management Walker for ambulatory assistance. Donut cushion.  I will plan to see Cassidy [name] back in 1-2 weeks in collaboration with her oncology appointments.   Cassidy [name] expressed understanding and was in agreement with this plan. She also understands that She can call the clinic at any time with  any questions, concerns, or complaints.     Any controlled substances utilized were prescribed in the context of palliative care. PDMP has been reviewed.    Time Total: 45 min   Visit consisted of counseling and education dealing with the complex and emotionally intense issues of symptom management and palliative care in the setting of serious  and potentially life-threatening illness.Greater than 50%  of this time was spent counseling and coordinating care related to the above assessment and plan.  Alda Lea, AGPCNP-BC  Palliative Medicine Team/[facility]

## 2022-03-10 NOTE — Progress Notes (Signed)
Decrease Alimta dose to 400 mg/m2 due to side effects from treatment per Cassie. Decrease future Alimta doses as well.  Raul Del [place], Ignacio BCPS, BCOP 03/10/2022 12:27 PM

## 2022-03-10 NOTE — Patient Instructions (Signed)
[facility]  Discharge Instructions: Thank you for choosing [facility] to provide your oncology and hematology care.   If you have a lab appointment with the Cancer Center, please go directly to the Cancer Center and check in at the registration area.   Wear comfortable clothing and clothing appropriate for easy access to any Portacath or PICC line.   We strive to give you quality time with your provider. You may need to reschedule your appointment if you arrive late (15 or more minutes).  Arriving late affects you and other patients whose appointments are after yours.  Also, if you miss three or more appointments without notifying the office, you may be dismissed from the clinic at the provider's discretion.      For prescription refill requests, have your pharmacy contact our office and allow 72 hours for refills to be completed.    Today you received the following chemotherapy and/or immunotherapy agents Keytruda and Alimta.      To help prevent nausea and vomiting after your treatment, we encourage you to take your nausea medication as directed.  BELOW ARE SYMPTOMS THAT SHOULD BE REPORTED IMMEDIATELY: *FEVER GREATER THAN 100.4 F (38 C) OR HIGHER *CHILLS OR SWEATING *NAUSEA AND VOMITING THAT IS NOT CONTROLLED WITH YOUR NAUSEA MEDICATION *UNUSUAL SHORTNESS OF BREATH *UNUSUAL BRUISING OR BLEEDING *URINARY PROBLEMS (pain or burning when urinating, or frequent urination) *BOWEL PROBLEMS (unusual diarrhea, constipation, pain near the anus) TENDERNESS IN MOUTH AND THROAT WITH OR WITHOUT PRESENCE OF ULCERS (sore throat, sores in mouth, or a toothache) UNUSUAL RASH, SWELLING OR PAIN  UNUSUAL VAGINAL DISCHARGE OR ITCHING   Items with * indicate a potential emergency and should be followed up as soon as possible or go to the Emergency Department if any problems should occur.  Please show the CHEMOTHERAPY ALERT CARD or IMMUNOTHERAPY ALERT  CARD at check-in to the Emergency Department and triage nurse.  Should you have questions after your visit or need to cancel or reschedule your appointment, please contact [facility]  Dept: [phone]  and follow the prompts.  Office hours are 8:00 a.m. to 4:30 p.m. Monday - Friday. Please note that voicemails left after 4:00 p.m. may not be returned until the following business day.  We are closed weekends and major holidays. You have access to a nurse at all times for urgent questions. Please call the main number to the clinic Dept: [phone] and follow the prompts.   For any non-urgent questions, you may also contact your provider using MyChart. We now offer e-Visits for anyone 18 and older to request care online for non-urgent symptoms. For details visit [email or web address].   Also download the MyChart app! Go to the app store, search "MyChart", open the app, select [facility], and log in with your MyChart username and password.   

## 2022-03-10 NOTE — Telephone Encounter (Addendum)
[facility] cannot take pt .   Referral faxed [facility]". Marland Kitchen

## 2022-03-10 NOTE — Telephone Encounter (Signed)
Referrals to [facility] and [facility] - LVM to return my call.

## 2022-03-11 ENCOUNTER — Telehealth: Payer: Self-pay

## 2022-03-11 NOTE — Telephone Encounter (Signed)
Cassidy [name], counseling intern, called patient after hearing from the nurse the patient was tearful.   The patient reported they are feeling better today. The patient shared they were given helpful suggestions for their pain management during their visit yesterday.   The patient and counselor will discuss more in the next counseling session scheduled for Monday, March 4th at [place],  Counseling Intern  [phone] [email or web address]

## 2022-03-12 ENCOUNTER — Other Ambulatory Visit: Payer: Self-pay

## 2022-03-12 ENCOUNTER — Encounter: Payer: Self-pay | Admitting: Neurology

## 2022-03-12 ENCOUNTER — Telehealth: Payer: Self-pay | Admitting: Medical Oncology

## 2022-03-12 DIAGNOSIS — C3491 Malignant neoplasm of unspecified part of right bronchus or lung: Secondary | ICD-10-CM

## 2022-03-12 DIAGNOSIS — R4589 Other symptoms and signs involving emotional state: Secondary | ICD-10-CM

## 2022-03-12 NOTE — Telephone Encounter (Signed)
The practice cannot see pt because they do not have a medicaid provider.

## 2022-03-12 NOTE — Progress Notes (Signed)
Referral submitted to [facility] for this patient.  Faxed office visit notes, referral and demographics.  Fax :[phone] Phone: [phone].

## 2022-03-13 ENCOUNTER — Other Ambulatory Visit: Payer: Self-pay | Admitting: Internal Medicine

## 2022-03-15 NOTE — Progress Notes (Signed)
Cassidy [name], counseling intern, met with patient for their scheduled counseling session.   The patient shared they are feeling isolated because with the pain and chemo side effects they do not have the motivation to connect with others. The patient shared at the beginning of their diagnosis people used to call and check in. The patient shared now it feels like people are shying away from calling them and are no longer inviting them to events and gatherings.   The patient shared about their experience taking care of their mother when she passed away from cancer. The patient reflected it was a healing experience for the both of them.   In order to build connection back into the patient's life, the patient plans on calling three people this week and doing an hour of crafts.   The patient scheduled their next counseling session for Monday, March 18th at 2pm and their last counseling session is scheduled for Monday, March 25th at [facility] Barrera,  Counseling Intern [phone] [email or web address]

## 2022-03-18 ENCOUNTER — Inpatient Hospital Stay: Payer: Medicaid Other | Attending: Internal Medicine | Admitting: Nurse Practitioner

## 2022-03-18 DIAGNOSIS — Z79899 Other long term (current) drug therapy: Secondary | ICD-10-CM | POA: Insufficient documentation

## 2022-03-18 DIAGNOSIS — Z79620 Long term (current) use of immunosuppressive biologic: Secondary | ICD-10-CM | POA: Insufficient documentation

## 2022-03-18 DIAGNOSIS — R112 Nausea with vomiting, unspecified: Secondary | ICD-10-CM | POA: Insufficient documentation

## 2022-03-18 DIAGNOSIS — Z79631 Long term (current) use of antimetabolite agent: Secondary | ICD-10-CM | POA: Insufficient documentation

## 2022-03-18 DIAGNOSIS — Z5112 Encounter for antineoplastic immunotherapy: Secondary | ICD-10-CM | POA: Insufficient documentation

## 2022-03-18 DIAGNOSIS — C787 Secondary malignant neoplasm of liver and intrahepatic bile duct: Secondary | ICD-10-CM | POA: Insufficient documentation

## 2022-03-18 DIAGNOSIS — Z880 Allergy status to penicillin: Secondary | ICD-10-CM | POA: Insufficient documentation

## 2022-03-18 DIAGNOSIS — C3411 Malignant neoplasm of upper lobe, right bronchus or lung: Secondary | ICD-10-CM | POA: Insufficient documentation

## 2022-03-18 DIAGNOSIS — Z5111 Encounter for antineoplastic chemotherapy: Secondary | ICD-10-CM | POA: Insufficient documentation

## 2022-03-18 DIAGNOSIS — R6881 Early satiety: Secondary | ICD-10-CM | POA: Insufficient documentation

## 2022-03-18 DIAGNOSIS — K219 Gastro-esophageal reflux disease without esophagitis: Secondary | ICD-10-CM | POA: Insufficient documentation

## 2022-03-18 DIAGNOSIS — R601 Generalized edema: Secondary | ICD-10-CM | POA: Insufficient documentation

## 2022-03-18 DIAGNOSIS — R600 Localized edema: Secondary | ICD-10-CM | POA: Insufficient documentation

## 2022-03-18 DIAGNOSIS — E119 Type 2 diabetes mellitus without complications: Secondary | ICD-10-CM | POA: Insufficient documentation

## 2022-03-18 DIAGNOSIS — R1115 Cyclical vomiting syndrome unrelated to migraine: Secondary | ICD-10-CM | POA: Insufficient documentation

## 2022-03-18 DIAGNOSIS — D573 Sickle-cell trait: Secondary | ICD-10-CM | POA: Insufficient documentation

## 2022-03-18 DIAGNOSIS — I10 Essential (primary) hypertension: Secondary | ICD-10-CM | POA: Insufficient documentation

## 2022-03-18 NOTE — Progress Notes (Deleted)
[facility]  Telephone:(336) [phone] Fax:(336) [phone]   Name: Cassidy [name] Date: 03/18/2022 MRN: [id number]  DOB: Jul 18, 1965  Patient Care Team: [name], DO as PCP - General (Family Medicine) O'Neal, Cassie Freer, MD as PCP - Cardiology (Cardiology) Rolm Bookbinder, MD as Consulting Physician (General Surgery)   I connected with Cassidy [name] on 03/18/22 at  9:00 AM EST by phone and verified that I am speaking with the correct person using two identifiers.   I discussed the limitations, risks, security and privacy concerns of performing an evaluation and management service by telemedicine and the availability of in-person appointments. I also discussed with the patient that there may be a patient responsible charge related to this service. The patient expressed understanding and agreed to proceed.   Other persons participating in the visit and their role in the encounter: n/a   Patient's location: home   Provider's location: [facility]   Chief Complaint: f/u of symptom management    INTERVAL HISTORY: Cassidy [name] is a 57 y.o. 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  Palliative ask to see for symptom management and goals of care.   SOCIAL HISTORY:     reports that she has been smoking cigarettes. She started smoking about 43 years ago. She has a 21.00 pack-year smoking history. She has never used smokeless tobacco. She reports that she does not currently use drugs. She reports that she does not drink alcohol.  ADVANCE DIRECTIVES:    CODE STATUS:   PAST MEDICAL HISTORY: Past Medical History:  Diagnosis Date   Adenocarcinoma of right lung, stage 4 ([place]) 08/26/2021   Anemia    has sickle cell trait   Atherosclerotic heart disease of native coronary  artery with angina pectoris ([place]) 05/2012, 10/2013   a.  s/p PCTA to dRCA and ostial RPAV-PLA vessel + PDA branch (05/2012)  b. [place] s/p DES to mLAD - Resolute DES 3.0 x 22 (3.60m -->3.3 mm)   [place]    Breast abscess    a. right side.    COPD (chronic obstructive pulmonary disease) (HCC)    Depression    Diabetic peripheral neuropathy (HCC)    Fibromyalgia    Genital warts    GERD (gastroesophageal reflux disease)    History of hiatal hernia    HLD (hyperlipidemia)    Hypertension    Pain in limb    a. LE VENOUS DUPLEX, 02/05/2009 - no evidence of deep vein thrombosis, Baker's cyst   Pneumonia    PTSD (post-traumatic stress disorder)    Sickle cell trait (HCC)    Tobacco abuse    Tooth caries    Type II diabetes mellitus (HCC)     ALLERGIES:  is allergic to amoxil [amoxicillin], chantix [varenicline], suboxone [buprenorphine hcl-naloxone hcl], and emend [fosaprepitant dimeglumine].  MEDICATIONS:  Current Outpatient Medications  Medication Sig Dispense Refill   aspirin EC 81 MG tablet Take 81 mg by mouth daily.     atorvastatin (LIPITOR) 40 MG tablet TAKE 1 TABLET (40 MG TOTAL) BY MOUTH DAILY. 90 tablet 3   busPIRone (BUSPAR) 15 MG tablet TAKE 1/2 TABLET (7.5 MG TOTAL) BY MOUTH TWICE DAILY (Patient taking differently: Take 7.5 mg by mouth 2 (two) times daily.) 30 tablet 11   Carboxymethylcellul-Glycerin (LUBRICATING EYE DROPS OP) Place 1 drop into both eyes daily as  needed (dry eyes).     carvedilol (COREG) 12.5 MG tablet TAKE 1 TABLET (12.5 MG TOTAL) BY MOUTH 2 (TWO) TIMES DAILY WITH A MEAL. 180 tablet 1   diclofenac Sodium (VOLTAREN) 1 % GEL Apply 1 application topically 3 times daily as needed for pain 2 g 3   ferrous sulfate 325 (65 FE) MG tablet Take 325 mg by mouth daily with breakfast.     fluticasone (FLONASE) 50 MCG/ACT nasal spray PLACE 1 SPRAY INTO BOTH NOSTRILS DAILY AS NEEDED (CONGESTION). 16 g 1   fluticasone-salmeterol (ADVAIR) 100-50 MCG/ACT AEPB  Inhale 1 puff into the lungs 2 (two) times daily. 1 each 3   folic acid (FOLVITE) 1 MG tablet TAKE 1 TABLET (1 MG TOTAL) BY MOUTH DAILY. 30 tablet 3   furosemide (LASIX) 40 MG tablet Take 1 tablet (40 mg total) by mouth daily as needed. If you gain 3 lbs in 24 hours or 5 lbs in 1 week 30 tablet 1   glucose blood (ACCU-CHEK AVIVA PLUS) test strip Use as instructed 50 each 1   hyoscyamine (LEVSIN SL) 0.125 MG SL tablet Place 1 tablet (0.125 mg total) under the tongue every 6 (six) hours as needed. For abdominal cramping 30 tablet 0   insulin detemir (LEVEMIR FLEXPEN) 100 UNIT/ML FlexPen Inject 18 Units into the skin 2 (two) times daily. 15 mL 1   Insulin Pen Needle 32G X 4 MM MISC 1 Needle by Does not apply route in the morning and at bedtime. 120 each 2   ipratropium (ATROVENT HFA) 17 MCG/ACT inhaler Inhale 2 puffs into the lungs every 4 (four) hours as needed for wheezing (COPD).     lactulose (CHRONULAC) 10 GM/15ML solution Take 30 mLs (20 g total) by mouth 2 (two) times daily as needed for mild constipation, moderate constipation or severe constipation. 236 mL 0   lidocaine-prilocaine (EMLA) cream APPLY 1 APPLICATION TOPICALLY AS NEEDED. 30 g 2   LORazepam (ATIVAN) 0.5 MG tablet Take 1 tablet (0.5 mg total) by mouth every 8 (eight) hours as needed for anxiety (nausea). 30 tablet 0   nicotine polacrilex (NICORETTE) 2 MG gum Take 1 each (2 mg total) by mouth as needed for smoking cessation. 100 tablet 0   nitroGLYCERIN (NITROSTAT) 0.4 MG SL tablet Place 1 tablet (0.4 mg total) under the tongue every 5 (five) minutes as needed for chest pain. 25 tablet 5   nystatin (MYCOSTATIN) 100000 UNIT/ML suspension Take 5 mLs (500,000 Units total) by mouth 4 (four) times daily. 60 mL 0   ondansetron (ZOFRAN-ODT) 8 MG disintegrating tablet Take 1 tablet (8 mg total) by mouth every 8 (eight) hours as needed for nausea or vomiting. (Patient not taking: Reported on 02/02/2022) 30 tablet 2   oxyCODONE (OXY  IR/ROXICODONE) 5 MG immediate release tablet Take 1-2 tablets (5-10 mg total) by mouth every 6 (six) hours as needed for severe pain. 90 tablet 0   oxyCODONE ER (XTAMPZA ER) 13.5 MG C12A Take 13.5 mg by mouth every 12 (twelve) hours. 60 capsule 0   pantoprazole (PROTONIX) 40 MG tablet TAKE 1 TABLET (40 MG TOTAL) BY MOUTH DAILY. 30 tablet 1   polyethylene glycol powder (GLYCOLAX/MIRALAX) 17 GM/SCOOP powder Take 17 g by mouth daily as needed for mild constipation or moderate constipation. 3350 g 1   potassium chloride SA (KLOR-CON M) 20 MEQ tablet Take 1 tablet (20 mEq total) by mouth daily as needed. On days you take Lasix 30 tablet 1   prochlorperazine (COMPAZINE) 10  MG tablet Take 1 tablet (10 mg total) by mouth every 6 (six) hours as needed for nausea or vomiting. 30 tablet 0   sertraline (ZOLOFT) 50 MG tablet TAKE 1 TABLET (50 MG TOTAL) BY MOUTH DAILY. 30 tablet 3   valACYclovir (VALTREX) 1000 MG tablet TAKE FOR 3 DAYS AS NEEDED FOR EACH OUTBREAK 30 tablet 11   vitamin B-12 (CYANOCOBALAMIN) 1000 MCG tablet Take 1,000 mcg by mouth daily.     No current facility-administered medications for this visit.    VITAL SIGNS: LMP 06/12/2016 (Approximate)  There were no vitals filed for this visit.  Estimated body mass index is 31.45 kg/m as calculated from the following:   Height as of 01/06/22: 5' 7.5" (1.715 m).   Weight as of 03/10/22: 203 lb 12.8 oz (92.4 kg).   PERFORMANCE STATUS (ECOG) : 1 - Symptomatic but completely ambulatory   Physical Exam General: NAD Pulmonary: normal breathing pattern  Extremities: no edema, no joint deformities Skin: no rashes Neurological: AAO x3, mood appropriate   IMPRESSION:    Neoplasm related pain Cassidy [name] shares her pain has recently increased. Reports pain in hips and legs in addition to neuropathic pain/discomfort. She has not left her home in more than a week due to pain.   We discussed her current regimen at length. She is taking Xtampza '9mg'$   every 12 hours and Oxycodone as needed for breakthrough pain. Education provided on use of medication given recent increase in pain. We will increase her Xtampza to 13 mg every 12 hours.   She is being referred to Neurologist for additional support and neuropathic symptoms. In the interim patient was previously on gabapentin '600mg'$  however has not been taken. Advised to restart. She has pills on hand and denies need for new prescription at this time.   Cassidy [name] is requesting a walker for additional mobility support. Order will be placed as well as donut cushion.   We will continue to closely monitor and adjust medications accordingly.   2. Constipation   3. Nausea   4. Goals of care  03/10/22- We discussed patient's disease trajectory and current quality of life. Cassidy [name] is tearful expressing although her treatments are allowing for stability her symptoms are decreasing her quality of life. She is not able to go out and enjoy time with her family. She is contemplating discontinuing treatment and focus on her quality of life.   I created space and opportunity for Cassidy [name] to openly discuss her feelings and thoughts. We discussed aggressively focusing on treatment with hopes of improvement in her quality of life. She acknowledges some depression as "being sick with cancer" has now become more of a reality. Emotional support provided. She is in the process of establishing support with a new counselor for emotional support.   Patient understands we are here for ongoing support and navigating thru her health journey.   I discussed the importance of continued conversation with family and their medical providers regarding overall plan of care and treatment options, ensuring decisions are within the context of the patients values and GOCs.  PLAN: Xtampza 13 mg every 12 hours Oxy IR 5-10 mg every 6 hours as needed for breakthrough pain Lactulose for bowel regimen. Unable to tolerate Linzess. Senna 2  tabs twice daily  Zofran, compazine, and ativan as needed for nausea Ongoing emotional support and symptom management Walker for ambulatory assistance. Donut cushion.  I will plan to see patient back in 1-2 weeks in collaboration with her oncology appointments.  Patient expressed understanding and was in agreement with this plan. She also understands that She can call the clinic at any time with any questions, concerns, or complaints.     Any controlled substances utilized were prescribed in the context of palliative care. PDMP has been reviewed.    Time Total: 45 min   Visit consisted of counseling and education dealing with the complex and emotionally intense issues of symptom management and palliative care in the setting of serious and potentially life-threatening illness.Greater than 50%  of this time was spent counseling and coordinating care related to the above assessment and plan.  Cassidy [name], AGPCNP-BC  Palliative Medicine Team/[place]

## 2022-03-26 NOTE — Progress Notes (Unsigned)
[place]  Telephone:(336) [phone] Fax:(336) [phone]   Name: [name] Date: 03/26/2022 MRN: [id number]  DOB: 24-Nov-1965  Patient Care Team: [name], DO as PCP - General (Family Medicine) O'Neal, Cassie Freer, MD as PCP - Cardiology (Cardiology) Rolm Bookbinder, MD as Consulting Physician (General Surgery)    INTERVAL HISTORY: Cassidy [name] is a 57 y.o. 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  Palliative ask to see for symptom management and goals of care.   SOCIAL HISTORY:     reports that she has been smoking cigarettes. She started smoking about 43 years ago. She has a 21.00 pack-year smoking history. She has never used smokeless tobacco. She reports that she does not currently use drugs. She reports that she does not drink alcohol.  ADVANCE DIRECTIVES:    CODE STATUS:   PAST MEDICAL HISTORY: Past Medical History:  Diagnosis Date   Adenocarcinoma of right lung, stage 4 ([place]) 08/26/2021   Anemia    has sickle cell trait   Atherosclerotic heart disease of native coronary artery with angina pectoris ([place]) 05/2012, 10/2013   a.  s/p PCTA to dRCA and ostial RPAV-PLA vessel + PDA branch (05/2012)  b. [place] s/p DES to mLAD - Resolute DES 3.0 x 22 (3.77mm -->3.3 mm)   Bipolar depression ([place])    Breast abscess    a. right side.    COPD (chronic obstructive pulmonary disease) (HCC)    Depression    Diabetic peripheral neuropathy (HCC)    Fibromyalgia    Genital warts    GERD (gastroesophageal reflux disease)    History of hiatal hernia    HLD (hyperlipidemia)    Hypertension    Pain in limb    a. LE VENOUS DUPLEX, 02/05/2009 - no evidence of deep vein thrombosis, Baker's cyst   Pneumonia    PTSD (post-traumatic stress disorder)    Sickle cell  trait (HCC)    Tobacco abuse    Tooth caries    Type II diabetes mellitus (HCC)     ALLERGIES:  is allergic to amoxil [amoxicillin], chantix [varenicline], suboxone [buprenorphine hcl-naloxone hcl], and emend [fosaprepitant dimeglumine].  MEDICATIONS:  Current Outpatient Medications  Medication Sig Dispense Refill   aspirin EC 81 MG tablet Take 81 mg by mouth daily.     atorvastatin (LIPITOR) 40 MG tablet TAKE 1 TABLET (40 MG TOTAL) BY MOUTH DAILY. 90 tablet 3   busPIRone (BUSPAR) 15 MG tablet TAKE 1/2 TABLET (7.5 MG TOTAL) BY MOUTH TWICE DAILY (Patient taking differently: Take 7.5 mg by mouth 2 (two) times daily.) 30 tablet 11   Carboxymethylcellul-Glycerin (LUBRICATING EYE DROPS OP) Place 1 drop into both eyes daily as needed (dry eyes).     carvedilol (COREG) 12.5 MG tablet TAKE 1 TABLET (12.5 MG TOTAL) BY MOUTH 2 (TWO) TIMES DAILY WITH A MEAL. 180 tablet 1   diclofenac Sodium (VOLTAREN) 1 % GEL Apply 1 application topically 3 times daily as needed for pain 2 g 3   ferrous sulfate 325 (65 FE) MG tablet Take 325 mg by mouth daily with breakfast.     fluticasone (FLONASE) 50 MCG/ACT nasal spray PLACE 1 SPRAY INTO BOTH NOSTRILS DAILY AS NEEDED (CONGESTION). 16 g 1   fluticasone-salmeterol (ADVAIR) 100-50 MCG/ACT AEPB Inhale 1 puff into the lungs 2 (two) times daily. 1 each 3   folic  acid (FOLVITE) 1 MG tablet TAKE 1 TABLET (1 MG TOTAL) BY MOUTH DAILY. 30 tablet 3   furosemide (LASIX) 40 MG tablet Take 1 tablet (40 mg total) by mouth daily as needed. If you gain 3 lbs in 24 hours or 5 lbs in 1 week 30 tablet 1   glucose blood (ACCU-CHEK AVIVA PLUS) test strip Use as instructed 50 each 1   hyoscyamine (LEVSIN SL) 0.125 MG SL tablet Place 1 tablet (0.125 mg total) under the tongue every 6 (six) hours as needed. For abdominal cramping 30 tablet 0   insulin detemir (LEVEMIR FLEXPEN) 100 UNIT/ML FlexPen Inject 18 Units into the skin 2 (two) times daily. 15 mL 1   Insulin Pen Needle 32G X 4 MM MISC  1 Needle by Does not apply route in the morning and at bedtime. 120 each 2   ipratropium (ATROVENT HFA) 17 MCG/ACT inhaler Inhale 2 puffs into the lungs every 4 (four) hours as needed for wheezing (COPD).     lactulose (CHRONULAC) 10 GM/15ML solution Take 30 mLs (20 g total) by mouth 2 (two) times daily as needed for mild constipation, moderate constipation or severe constipation. 236 mL 0   lidocaine-prilocaine (EMLA) cream APPLY 1 APPLICATION TOPICALLY AS NEEDED. 30 g 2   LORazepam (ATIVAN) 0.5 MG tablet Take 1 tablet (0.5 mg total) by mouth every 8 (eight) hours as needed for anxiety (nausea). 30 tablet 0   nicotine polacrilex (NICORETTE) 2 MG gum Take 1 each (2 mg total) by mouth as needed for smoking cessation. 100 tablet 0   nitroGLYCERIN (NITROSTAT) 0.4 MG SL tablet Place 1 tablet (0.4 mg total) under the tongue every 5 (five) minutes as needed for chest pain. 25 tablet 5   nystatin (MYCOSTATIN) 100000 UNIT/ML suspension Take 5 mLs (500,000 Units total) by mouth 4 (four) times daily. 60 mL 0   ondansetron (ZOFRAN-ODT) 8 MG disintegrating tablet Take 1 tablet (8 mg total) by mouth every 8 (eight) hours as needed for nausea or vomiting. (Patient not taking: Reported on 02/02/2022) 30 tablet 2   oxyCODONE (OXY IR/ROXICODONE) 5 MG immediate release tablet Take 1-2 tablets (5-10 mg total) by mouth every 6 (six) hours as needed for severe pain. 90 tablet 0   oxyCODONE ER (XTAMPZA ER) 13.5 MG C12A Take 13.5 mg by mouth every 12 (twelve) hours. 60 capsule 0   pantoprazole (PROTONIX) 40 MG tablet TAKE 1 TABLET (40 MG TOTAL) BY MOUTH DAILY. 30 tablet 1   polyethylene glycol powder (GLYCOLAX/MIRALAX) 17 GM/SCOOP powder Take 17 g by mouth daily as needed for mild constipation or moderate constipation. 3350 g 1   potassium chloride SA (KLOR-CON M) 20 MEQ tablet Take 1 tablet (20 mEq total) by mouth daily as needed. On days you take Lasix 30 tablet 1   prochlorperazine (COMPAZINE) 10 MG tablet Take 1 tablet  (10 mg total) by mouth every 6 (six) hours as needed for nausea or vomiting. 30 tablet 0   sertraline (ZOLOFT) 50 MG tablet TAKE 1 TABLET (50 MG TOTAL) BY MOUTH DAILY. 30 tablet 3   valACYclovir (VALTREX) 1000 MG tablet TAKE FOR 3 DAYS AS NEEDED FOR EACH OUTBREAK 30 tablet 11   vitamin B-12 (CYANOCOBALAMIN) 1000 MCG tablet Take 1,000 mcg by mouth daily.     No current facility-administered medications for this visit.    VITAL SIGNS: LMP 06/12/2016 (Approximate)  There were no vitals filed for this visit.  Estimated body mass index is 31.45 kg/m as calculated from the  following:   Height as of 01/06/22: 5' 7.5" (1.715 m).   Weight as of 03/10/22: 203 lb 12.8 oz (92.4 kg).   PERFORMANCE STATUS (ECOG) : 1 - Symptomatic but completely ambulatory   Physical Exam General: NAD Pulmonary: normal breathing pattern  Extremities: trace pedal edema, no joint deformities Skin: no rashes Neurological: AAO x3, mood appropriate   IMPRESSION:  Cassidy [name] presents to clinic today for follow-up. No acute distress noted. Is trying to remain as active as possible. Denies nausea, vomiting, diarrhea. Endorses ongoing constipation and indigestion feeling. States she is spitting up at times. Confirms she is taking her daily Protonix. Denies any new chest pain.   Shares she is working to be scheduled with a therapist at [facility]. Overall mood is much better today.   Neoplasm related pain Shanaia reports pain is well controlled on current regimen. She does continue to have some numbness and discomfort around sternal incision site crossing over to her breast. We discussed possibility of effects from previous heart surgery with muscle involvement.   We discussed her current regimen at length. She is taking Xtampza 13 mg every 12 hours and Oxycodone as needed for breakthrough pain. Education provided on use of medication given recent increase in pain. She is tolerating medications without difficulty. Refills are  appropriate.    She is being referred to Neurologist for additional support and neuropathic symptoms. In the interim patient was previously on gabapentin 600mg  however has not been taken. Advised to restart. She has pills on hand and denies need for new prescription at this time.   Alaze is requesting a walker for additional mobility support. Order will be placed as well as donut cushion.   We will continue to closely monitor and adjust medications accordingly. No changes today.   2. Constipation Ongoing constipation. Encouraged Miralax twice daily and Senna-S 2 tabs twice daily.Marland Kitchen She was unable to tolerate Linzess or lactulose.   3. Nausea Ms. Micek states she does have some intermittent nausea which she relates to acid production. Also endorses a "knot feeling" in epigastric area. This discomfort comes and goes. Sometimes worst with certain foods. She is taking Protonix daily. Education provided on use of reglan for additional symptom management.   4. Goals of care  03/10/22- We discussed patient's disease trajectory and current quality of life. Ms. Kegler is tearful expressing although her treatments are allowing for stability her symptoms are decreasing her quality of life. She is not able to go out and enjoy time with her family. She is contemplating discontinuing treatment and focus on her quality of life.   I created space and opportunity for Vallarie to openly discuss her feelings and thoughts. We discussed aggressively focusing on treatment with hopes of improvement in her quality of life. She acknowledges some depression as "being sick with cancer" has now become more of a reality. Emotional support provided. She is in the process of establishing support with a new counselor for emotional support.   Patient understands we are here for ongoing support and navigating thru her health journey.   I discussed the importance of continued conversation with family and their medical providers  regarding overall plan of care and treatment options, ensuring decisions are within the context of the patients values and GOCs.  PLAN: Xtampza 13 mg every 12 hours Oxy IR 5-10 mg every 6 hours as needed for breakthrough pain Miralax twice daily. Unable to tolerate lactulose or Linzess. Senna 2 tabs twice daily  Zofran, compazine, and ativan  as needed for nausea Ongoing emotional support and symptom management Walker for ambulatory assistance. Donut cushion.  I will plan to see patient back in 2-4 weeks in collaboration with her oncology appointments.   Patient expressed understanding and was in agreement with this plan. She also understands that She can call the clinic at any time with any questions, concerns, or complaints.     Any controlled substances utilized were prescribed in the context of palliative care. PDMP has been reviewed.    Time Total: 30 min   Visit consisted of counseling and education dealing with the complex and emotionally intense issues of symptom management and palliative care in the setting of serious and potentially life-threatening illness.Greater than 50%  of this time was spent counseling and coordinating care related to the above assessment and plan.  Alda Lea, AGPCNP-BC  Palliative Medicine Team/[place]

## 2022-03-29 ENCOUNTER — Ambulatory Visit (HOSPITAL_COMMUNITY)
Admission: RE | Admit: 2022-03-29 | Discharge: 2022-03-29 | Disposition: A | Discharge status: Home / Self Care | Payer: Medicaid Other | Source: Ambulatory Visit | Attending: Physician Assistant | Admitting: Physician Assistant

## 2022-03-29 ENCOUNTER — Telehealth: Payer: Self-pay

## 2022-03-29 DIAGNOSIS — C3491 Malignant neoplasm of unspecified part of right bronchus or lung: Secondary | ICD-10-CM | POA: Diagnosis not present

## 2022-03-29 MED ORDER — SODIUM CHLORIDE (PF) 0.9 % IJ SOLN
INTRAMUSCULAR | Status: AC
  Filled 2022-03-29: qty 50

## 2022-03-29 MED ORDER — HEPARIN SOD (PORK) LOCK FLUSH 100 UNIT/ML IV SOLN
500.0000 [IU] | Freq: Once | INTRAVENOUS | Status: AC
  Administered 2022-03-29: 500 [IU] via INTRAVENOUS

## 2022-03-29 MED ORDER — HEPARIN SOD (PORK) LOCK FLUSH 100 UNIT/ML IV SOLN
INTRAVENOUS | Status: AC
  Filled 2022-03-29: qty 5

## 2022-03-29 MED ORDER — IOHEXOL 300 MG/ML  SOLN
100.0000 mL | Freq: Once | INTRAMUSCULAR | Status: AC | PRN
  Administered 2022-03-29: 100 mL via INTRAVENOUS

## 2022-03-29 NOTE — Telephone Encounter (Signed)
Cassidy [name], counseling intern, called patient for their scheduled counseling session.   Patient did not answer the phone. Counselor left a message and tried calling back 15 minutes later. Patient did not answer the phone.  Counselor will try calling Wednesday to check in with client.   Client previously scheduled their last counseling session with the counseling intern for Monday, March 25th at [place],  Counseling Intern  [phone] [email or web address]

## 2022-03-31 ENCOUNTER — Inpatient Hospital Stay (HOSPITAL_BASED_OUTPATIENT_CLINIC_OR_DEPARTMENT_OTHER): Payer: Medicaid Other | Admitting: Internal Medicine

## 2022-03-31 ENCOUNTER — Other Ambulatory Visit: Payer: Self-pay

## 2022-03-31 ENCOUNTER — Other Ambulatory Visit: Payer: Self-pay | Admitting: Internal Medicine

## 2022-03-31 ENCOUNTER — Inpatient Hospital Stay: Payer: Medicaid Other

## 2022-03-31 ENCOUNTER — Encounter: Payer: Self-pay | Admitting: Nurse Practitioner

## 2022-03-31 ENCOUNTER — Inpatient Hospital Stay (HOSPITAL_BASED_OUTPATIENT_CLINIC_OR_DEPARTMENT_OTHER): Payer: Medicaid Other | Admitting: Nurse Practitioner

## 2022-03-31 VITALS — BP 116/81 | HR 69 | Temp 97.9°F | Resp 18

## 2022-03-31 DIAGNOSIS — M792 Neuralgia and neuritis, unspecified: Secondary | ICD-10-CM

## 2022-03-31 DIAGNOSIS — C3491 Malignant neoplasm of unspecified part of right bronchus or lung: Secondary | ICD-10-CM

## 2022-03-31 DIAGNOSIS — Z79620 Long term (current) use of immunosuppressive biologic: Secondary | ICD-10-CM | POA: Diagnosis not present

## 2022-03-31 DIAGNOSIS — R262 Difficulty in walking, not elsewhere classified: Secondary | ICD-10-CM | POA: Diagnosis not present

## 2022-03-31 DIAGNOSIS — Z880 Allergy status to penicillin: Secondary | ICD-10-CM | POA: Diagnosis not present

## 2022-03-31 DIAGNOSIS — K219 Gastro-esophageal reflux disease without esophagitis: Secondary | ICD-10-CM | POA: Diagnosis not present

## 2022-03-31 DIAGNOSIS — R530 Neoplastic (malignant) related fatigue: Secondary | ICD-10-CM | POA: Diagnosis not present

## 2022-03-31 DIAGNOSIS — Z79899 Other long term (current) drug therapy: Secondary | ICD-10-CM | POA: Diagnosis not present

## 2022-03-31 DIAGNOSIS — Z515 Encounter for palliative care: Secondary | ICD-10-CM

## 2022-03-31 DIAGNOSIS — M6281 Muscle weakness (generalized): Secondary | ICD-10-CM | POA: Diagnosis not present

## 2022-03-31 DIAGNOSIS — Z79631 Long term (current) use of antimetabolite agent: Secondary | ICD-10-CM | POA: Diagnosis not present

## 2022-03-31 DIAGNOSIS — Z5111 Encounter for antineoplastic chemotherapy: Secondary | ICD-10-CM | POA: Diagnosis present

## 2022-03-31 DIAGNOSIS — R600 Localized edema: Secondary | ICD-10-CM | POA: Diagnosis not present

## 2022-03-31 DIAGNOSIS — G893 Neoplasm related pain (acute) (chronic): Secondary | ICD-10-CM

## 2022-03-31 DIAGNOSIS — R601 Generalized edema: Secondary | ICD-10-CM | POA: Diagnosis not present

## 2022-03-31 DIAGNOSIS — I10 Essential (primary) hypertension: Secondary | ICD-10-CM | POA: Diagnosis not present

## 2022-03-31 DIAGNOSIS — C787 Secondary malignant neoplasm of liver and intrahepatic bile duct: Secondary | ICD-10-CM | POA: Diagnosis not present

## 2022-03-31 DIAGNOSIS — R1115 Cyclical vomiting syndrome unrelated to migraine: Secondary | ICD-10-CM | POA: Diagnosis not present

## 2022-03-31 DIAGNOSIS — K5903 Drug induced constipation: Secondary | ICD-10-CM

## 2022-03-31 DIAGNOSIS — R110 Nausea: Secondary | ICD-10-CM

## 2022-03-31 DIAGNOSIS — R531 Weakness: Secondary | ICD-10-CM | POA: Diagnosis not present

## 2022-03-31 DIAGNOSIS — C3411 Malignant neoplasm of upper lobe, right bronchus or lung: Secondary | ICD-10-CM | POA: Diagnosis present

## 2022-03-31 DIAGNOSIS — R112 Nausea with vomiting, unspecified: Secondary | ICD-10-CM | POA: Diagnosis not present

## 2022-03-31 DIAGNOSIS — Z5112 Encounter for antineoplastic immunotherapy: Secondary | ICD-10-CM | POA: Diagnosis present

## 2022-03-31 DIAGNOSIS — Z95828 Presence of other vascular implants and grafts: Secondary | ICD-10-CM

## 2022-03-31 DIAGNOSIS — E119 Type 2 diabetes mellitus without complications: Secondary | ICD-10-CM | POA: Diagnosis not present

## 2022-03-31 DIAGNOSIS — R6881 Early satiety: Secondary | ICD-10-CM | POA: Diagnosis not present

## 2022-03-31 DIAGNOSIS — D573 Sickle-cell trait: Secondary | ICD-10-CM | POA: Diagnosis not present

## 2022-03-31 LAB — CBC WITH DIFFERENTIAL (CANCER CENTER ONLY)
Abs Immature Granulocytes: 0.01 10*3/uL (ref 0.00–0.07)
Basophils Absolute: 0 10*3/uL (ref 0.0–0.1)
Basophils Relative: 0 %
Eosinophils Absolute: 0.1 10*3/uL (ref 0.0–0.5)
Eosinophils Relative: 1 %
HCT: 29.1 % — ABNORMAL LOW (ref 36.0–46.0)
Hemoglobin: 10.1 g/dL — ABNORMAL LOW (ref 12.0–15.0)
Immature Granulocytes: 0 %
Lymphocytes Relative: 12 %
Lymphs Abs: 0.6 10*3/uL — ABNORMAL LOW (ref 0.7–4.0)
MCH: 34.6 pg — ABNORMAL HIGH (ref 26.0–34.0)
MCHC: 34.7 g/dL (ref 30.0–36.0)
MCV: 99.7 fL (ref 80.0–100.0)
Monocytes Absolute: 0.6 10*3/uL (ref 0.1–1.0)
Monocytes Relative: 11 %
Neutro Abs: 3.9 10*3/uL (ref 1.7–7.7)
Neutrophils Relative %: 76 %
Platelet Count: 272 10*3/uL (ref 150–400)
RBC: 2.92 MIL/uL — ABNORMAL LOW (ref 3.87–5.11)
RDW: 14.8 % (ref 11.5–15.5)
WBC Count: 5.2 10*3/uL (ref 4.0–10.5)
nRBC: 0 % (ref 0.0–0.2)

## 2022-03-31 LAB — CMP (CANCER CENTER ONLY)
ALT: 29 U/L (ref 0–44)
AST: 29 U/L (ref 15–41)
Albumin: 3.8 g/dL (ref 3.5–5.0)
Alkaline Phosphatase: 72 U/L (ref 38–126)
Anion gap: 6 (ref 5–15)
BUN: 10 mg/dL (ref 6–20)
CO2: 28 mmol/L (ref 22–32)
Calcium: 8.8 mg/dL — ABNORMAL LOW (ref 8.9–10.3)
Chloride: 105 mmol/L (ref 98–111)
Creatinine: 0.87 mg/dL (ref 0.44–1.00)
GFR, Estimated: >60 mL/min (ref >60)
Glucose, Bld: 177 mg/dL — ABNORMAL HIGH (ref 70–99)
Potassium: 3.7 mmol/L (ref 3.5–5.1)
Sodium: 139 mmol/L (ref 135–145)
Total Bilirubin: 0.6 mg/dL (ref 0.3–1.2)
Total Protein: 6.7 g/dL (ref 6.5–8.1)

## 2022-03-31 MED ORDER — SODIUM CHLORIDE 0.9% FLUSH
10.0000 mL | Freq: Once | INTRAVENOUS | Status: AC
  Administered 2022-03-31: 10 mL

## 2022-03-31 MED ORDER — HEPARIN SOD (PORK) LOCK FLUSH 100 UNIT/ML IV SOLN
500.0000 [IU] | Freq: Once | INTRAVENOUS | Status: AC | PRN
  Administered 2022-03-31: 500 [IU]

## 2022-03-31 MED ORDER — SODIUM CHLORIDE 0.9 % IV SOLN
200.0000 mg | Freq: Once | INTRAVENOUS | Status: AC
  Administered 2022-03-31: 200 mg via INTRAVENOUS
  Filled 2022-03-31: qty 8

## 2022-03-31 MED ORDER — PROCHLORPERAZINE MALEATE 10 MG PO TABS
10.0000 mg | ORAL_TABLET | Freq: Once | ORAL | Status: AC
  Administered 2022-03-31: 10 mg via ORAL
  Filled 2022-03-31: qty 1

## 2022-03-31 MED ORDER — SODIUM CHLORIDE 0.9 % IV SOLN: Freq: Once | INTRAVENOUS | Status: AC

## 2022-03-31 MED ORDER — METOCLOPRAMIDE HCL 10 MG PO TABS: 10.0000 mg | ORAL_TABLET | Freq: Three times a day (TID) | ORAL | 0 refills | Status: DC

## 2022-03-31 MED ORDER — SODIUM CHLORIDE 0.9 % IV SOLN
400.0000 mg/m2 | Freq: Once | INTRAVENOUS | Status: AC
  Administered 2022-03-31: 800 mg via INTRAVENOUS
  Filled 2022-03-31: qty 20

## 2022-03-31 MED ORDER — SODIUM CHLORIDE 0.9% FLUSH
10.0000 mL | INTRAVENOUS | Status: DC | PRN
  Administered 2022-03-31: 10 mL

## 2022-03-31 MED ORDER — FOLIC ACID 1 MG PO TABS: 1.0000 mg | ORAL_TABLET | Freq: Every day | ORAL | 3 refills | Status: DC

## 2022-03-31 NOTE — Patient Instructions (Signed)

## 2022-03-31 NOTE — Progress Notes (Signed)
Initial neurology clinic note  DARRILYN HORTIN MRN: [id number] DOB: December 21, 1965  Referring provider: Heilingoetter, Aram Candela*  Primary care provider: [name], DO  Reason for consult:  neuropathic pain  Subjective:  This is [name], a 57 y.o. 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 The patient is alone today.  Patient had a triple bypass surgery in 06/2021. She was having ongoing chest pain. She was later found to have stage IV lung cancer. She has done radiation and chemotherapy.  She had a history of diabetic neuropathy prior to chemotherapy. Symptoms have worsened with chemotherapy. She describes burning pain into both legs up to her knees. She has burning in her fingers as well. She thinks the left leg may be more weak. She gets dizzy and off balance. She has been given a walker, which she just started using. She denies recent falls. She also has bone pain in hips and back due to bone mets. She takes voltaren gel, oxycodone, and Xtampza for pain.   She was previously on gabapentin 600 mg BID. She is not sure there was a great difference. She has been off of this about 1 year ago.  She had some migraines after chemotherapy, but this has improved.  Per most recent oncology note from 03/31/22 by Dr. Julien Nordmann: ASSESSMENT AND PLAN: This is a very pleasant 57 years old African-American female recently diagnosed with a stage IV (T2 a, N2, M1 C) non-small cell lung cancer, adenocarcinoma presented with right upper lobe lung mass in addition to right hilar and mediastinal lymphadenopathy as well as innumerable bilateral pulmonary nodules and bone and liver metastasis diagnosed in August 2023 with positive KRAS G12C mutation and PD-L1  expression of 89%. She is currently undergoing systemic chemotherapy with carboplatin for AUC of 5, Alimta 500 Mg/M2 and Keytruda 200 Mg IV every 3 weeks status post 10 cycles.  The patient has been tolerating this treatment fairly well except for the frequent nausea and vomiting.  She is currently on Zofran ODT with some improvement.  She also has early satiety and interested in seeing a gastroenterologist for further evaluation of her condition.  I will make referral to gastroenterology. She had repeat CT scan of the chest, abdomen and pelvis performed recently.  I personally and independently reviewed the scan and discussed the result with the patient today. Her scan showed no concerning findings for disease progression but there was increasing mesenteric stranding and trace pelvic free fluid with areas of wall thickening along the small bowel loops which are not dilated. I recommended for the patient to continue her current treatment with maintenance Alimta and Keytruda and she will proceed with cycle #11 today. For the dyspepsia and epigastric pain, she will continue her current treatment with Protonix. For the pain management she is followed by the palliative care team and currently on Xtampza ER 13.5 mg every 12 hours in addition to Oxy IR for breakthrough pain. The patient will come back for follow-up visit in 3 weeks for evaluation before starting cycle #12.  Of note, patient is on zoloft and buspar for mood. These medications are working well. This is managed by PCP, Dr. Nita Sells.  MEDICATIONS:  Outpatient Encounter Medications as of 04/07/2022  Medication Sig   oxyCODONE ER (XTAMPZA ER)  13.5 MG C12A Take 13.5 mg by mouth in the morning and at bedtime.   aspirin EC 81 MG tablet Take 81 mg by mouth daily.   atorvastatin (LIPITOR) 40 MG tablet TAKE 1 TABLET (40 MG TOTAL) BY MOUTH DAILY.   busPIRone (BUSPAR) 15 MG tablet TAKE 1/2 TABLET (7.5 MG TOTAL) BY MOUTH TWICE DAILY (Patient taking  differently: Take 7.5 mg by mouth 2 (two) times daily.)   Carboxymethylcellul-Glycerin (LUBRICATING EYE DROPS OP) Place 1 drop into both eyes daily as needed (dry eyes).   carvedilol (COREG) 12.5 MG tablet TAKE 1 TABLET (12.5 MG TOTAL) BY MOUTH 2 (TWO) TIMES DAILY WITH A MEAL.   diclofenac Sodium (VOLTAREN) 1 % GEL Apply 1 application topically 3 times daily as needed for pain   ferrous sulfate 325 (65 FE) MG tablet Take 325 mg by mouth daily with breakfast.   fluticasone (FLONASE) 50 MCG/ACT nasal spray PLACE 1 SPRAY INTO BOTH NOSTRILS DAILY AS NEEDED (CONGESTION).   fluticasone-salmeterol (ADVAIR) 100-50 MCG/ACT AEPB Inhale 1 puff into the lungs 2 (two) times daily.   folic acid (FOLVITE) 1 MG tablet Take 1 tablet (1 mg total) by mouth daily.   furosemide (LASIX) 40 MG tablet Take 1 tablet (40 mg total) by mouth daily as needed. If you gain 3 lbs in 24 hours or 5 lbs in 1 week   glucose blood (ACCU-CHEK AVIVA PLUS) test strip Use as instructed   hyoscyamine (LEVSIN SL) 0.125 MG SL tablet Place 1 tablet (0.125 mg total) under the tongue every 6 (six) hours as needed. For abdominal cramping   insulin detemir (LEVEMIR FLEXPEN) 100 UNIT/ML FlexPen Inject 18 Units into the skin 2 (two) times daily.   Insulin Pen Needle 32G X 4 MM MISC 1 Needle by Does not apply route in the morning and at bedtime.   ipratropium (ATROVENT HFA) 17 MCG/ACT inhaler Inhale 2 puffs into the lungs every 4 (four) hours as needed for wheezing (COPD).   lactulose (CHRONULAC) 10 GM/15ML solution Take 30 mLs (20 g total) by mouth 2 (two) times daily as needed for mild constipation, moderate constipation or severe constipation.   lidocaine-prilocaine (EMLA) cream APPLY 1 APPLICATION TOPICALLY AS NEEDED.   LORazepam (ATIVAN) 0.5 MG tablet Take 1 tablet (0.5 mg total) by mouth every 8 (eight) hours as needed for anxiety (nausea).   metoCLOPramide (REGLAN) 10 MG tablet Take 1 tablet (10 mg total) by mouth 3 (three) times daily before  meals.   nicotine polacrilex (NICORETTE) 2 MG gum Take 1 each (2 mg total) by mouth as needed for smoking cessation.   nitroGLYCERIN (NITROSTAT) 0.4 MG SL tablet Place 1 tablet (0.4 mg total) under the tongue every 5 (five) minutes as needed for chest pain.   nystatin (MYCOSTATIN) 100000 UNIT/ML suspension Take 5 mLs (500,000 Units total) by mouth 4 (four) times daily.   ondansetron (ZOFRAN-ODT) 8 MG disintegrating tablet Take 1 tablet (8 mg total) by mouth every 8 (eight) hours as needed for nausea or vomiting. (Patient not taking: Reported on 02/02/2022)   oxyCODONE (OXY IR/ROXICODONE) 5 MG immediate release tablet Take 1-2 tablets (5-10 mg total) by mouth every 6 (six) hours as needed for severe pain.   oxyCODONE ER (XTAMPZA ER) 13.5 MG C12A Take 13.5 mg by mouth every 12 (twelve) hours.   pantoprazole (PROTONIX) 40 MG tablet TAKE 1 TABLET (40 MG TOTAL) BY MOUTH DAILY.   polyethylene glycol powder (GLYCOLAX/MIRALAX) 17 GM/SCOOP powder Take 17 g by mouth daily as needed for  mild constipation or moderate constipation.   potassium chloride SA (KLOR-CON M) 20 MEQ tablet Take 1 tablet (20 mEq total) by mouth daily as needed. On days you take Lasix   prochlorperazine (COMPAZINE) 10 MG tablet Take 1 tablet (10 mg total) by mouth every 6 (six) hours as needed for nausea or vomiting.   sertraline (ZOLOFT) 50 MG tablet TAKE 1 TABLET (50 MG TOTAL) BY MOUTH DAILY.   valACYclovir (VALTREX) 1000 MG tablet TAKE FOR 3 DAYS AS NEEDED FOR EACH OUTBREAK   vitamin B-12 (CYANOCOBALAMIN) 1000 MCG tablet Take 1,000 mcg by mouth daily.   [EXPIRED] pembrolizumab (KEYTRUDA) 200 mg in sodium chloride 0.9 % 50 mL chemo infusion    [EXPIRED] PEMEtrexed (ALIMTA) 800 mg in sodium chloride 0.9 % 100 mL chemo infusion    No facility-administered encounter medications on file as of 04/07/2022.    PAST MEDICAL HISTORY: Past Medical History:  Diagnosis Date   Adenocarcinoma of right lung, stage 4 ([place]) 08/26/2021   Anemia     has sickle cell trait   Atherosclerotic heart disease of native coronary artery with angina pectoris ([place]) 05/2012, 10/2013   a.  s/p PCTA to dRCA and ostial RPAV-PLA vessel + PDA branch (05/2012)  b. [place] s/p DES to mLAD - Resolute DES 3.0 x 22 (3.100mm -->3.3 mm)   Bipolar depression ([place])    Breast abscess    a. right side.    COPD (chronic obstructive pulmonary disease) (H[facility])    Depression    Diabetic peripheral neuropathy ([facility])    Fibromyalgia    Genital warts    GERD (gastroesophageal reflux disease)    History of hiatal hernia    HLD (hyperlipidemia)    Hypertension    Pain in limb    a. LE VENOUS DUPLEX, 02/05/2009 - no evidence of deep vein thrombosis, Baker's cyst   Pneumonia    PTSD (post-traumatic stress disorder)    Sickle cell trait ([place])    Tobacco abuse    Tooth caries    Type II diabetes mellitus ([place])     PAST SURGICAL HISTORY: Past Surgical History:  Procedure Laterality Date   BLADDER SURGERY  1980   "TVT"   BLADDER SURGERY  2003   "TVT"   BREAST EXCISIONAL BIOPSY     BREAST SURGERY Right    I&D for multiple abscesses   CARDIAC CATHETERIZATION     CORONARY ARTERY BYPASS GRAFT N/A 06/30/2021   Procedure: CORONARY ARTERY BYPASS GRAFTING (CABG) x3 USING LEFT INTERNAL MAMMARY ARTERY,  RIGHT RADIAL ARTERY AND LEFT GREATER SAPHENOUS VEIN;  Surgeon: Lajuana Matte, MD;  Location: [facility];  Service: Open Heart Surgery;  Laterality: N/A;   cutting balloon     ENDOVEIN HARVEST OF GREATER SAPHENOUS VEIN Left 06/30/2021   Procedure: ENDOVEIN HARVEST OF GREATER SAPHENOUS VEIN;  Surgeon: Lajuana Matte, MD;  Location: [facility];  Service: Open Heart Surgery;  Laterality: Left;   EYE SURGERY Left    laser surgery   FINE NEEDLE ASPIRATION  08/21/2021   Procedure: FINE NEEDLE ASPIRATION (FNA) LINEAR;  Surgeon: Garner Nash, DO;  Location: [facility];  Service: Pulmonary;;   INCISE AND DRAIN ABCESS  ; 04/26/11; 08/13/11   right breast   INCISION AND DRAINAGE ABSCESS  Right 05/09/2012   Procedure: INCISION AND DRAINAGE RIGHT BREAST ABSCESS;  Surgeon: Imogene Burn. Georgette Dover, MD;  Location: [facility];  Service: General;  Laterality: Right;   INCISION AND DRAINAGE ABSCESS Right 02/08/2014   Procedure: INCISION  AND DRAINAGE RIGHT BREAST ABSCESS;  Surgeon: Donnie Mesa, MD;  Location: [facility];  Service: General;  Laterality: Right;   INCISION AND DRAINAGE ABSCESS Right 06/14/2014   Procedure: INCISION AND DRAINAGE RIGHT BREAST ABSCESS;  Surgeon: Donnie Mesa, MD;  Location: [facility];  Service: General;  Laterality: Right;   IR IMAGING GUIDED PORT INSERTION  12/08/2021   IRRIGATION AND DEBRIDEMENT ABSCESS  12/19/2010   Procedure: IRRIGATION AND DEBRIDEMENT ABSCESS;  Surgeon: Rolm Bookbinder, MD;  Location: [facility];  Service: General;  Laterality: Right;   IRRIGATION AND DEBRIDEMENT ABSCESS  08/13/2011   Procedure: MINOR INCISION AND DRAINAGE OF ABSCESS;  Surgeon: Gwenyth Ober, MD;  Location: [facility];  Service: General;  Laterality: Right;  Right Breast    IRRIGATION AND DEBRIDEMENT ABSCESS  11/17/2011   Procedure: IRRIGATION AND DEBRIDEMENT ABSCESS;  Surgeon: Imogene Burn. Georgette Dover, MD;  Location: [facility];  Service: General;  Laterality: Right;  irrigation and debridement right recurrent breast abscess   LARYNX SURGERY     LEFT HEART CATH AND CORONARY ANGIOGRAPHY N/A 04/09/2021   Procedure: LEFT HEART CATH AND CORONARY ANGIOGRAPHY;  Surgeon: Jettie Booze, MD;  Location: [facility];  Service: Cardiovascular;  Laterality: N/A;   LEFT HEART CATHETERIZATION WITH CORONARY ANGIOGRAM N/A 05/29/2012   Procedure: LEFT HEART CATHETERIZATION WITH CORONARY ANGIOGRAM & PTCA;  Surgeon: Troy Sine, MD;  Location: [facility] C[facility] LAB;  Service: Cardiovascular; Bifurcation dRCA-RPAD/PLA & rPDA  PTCA.   LEFT HEART CATHETERIZATION WITH CORONARY ANGIOGRAM N/A 11/08/2013   Procedure: LEFT HEART CATHETERIZATION WITH CORONARY ANGIOGRAM and Coronary Stent Intervention;  Surgeon: Leonie Man, MD;   Location: [facility] CATH LAB;  Service: Cardiovascular; mLAD 80% (FFR 0.73) - resolute DES 3.0 x 22 mm (postdilated to 3.3 mm->3.5 mm)   NM MYOVIEW LTD  01/26/2009   Normal study, no evidence of ischemia, EF 67%   ployp removed from voice box 03/30/12     RADIAL ARTERY HARVEST Right 06/30/2021   Procedure: RADIAL ARTERY HARVEST;  Surgeon: Lajuana Matte, MD;  Location: [facility];  Service: Open Heart Surgery;  Laterality: Right;   REFRACTIVE SURGERY  ~ 2010   right   TEE WITHOUT CARDIOVERSION N/A 06/30/2021   Procedure: TRANSESOPHAGEAL ECHOCARDIOGRAM (TEE);  Surgeon: Lajuana Matte, MD;  Location: [facility];  Service: Open Heart Surgery;  Laterality: N/A;   TONSILLECTOMY  1988   VIDEO BRONCHOSCOPY WITH ENDOBRONCHIAL ULTRASOUND Bilateral 08/21/2021   Procedure: VIDEO BRONCHOSCOPY WITH ENDOBRONCHIAL ULTRASOUND;  Surgeon: Garner Nash, DO;  Location: [facility];  Service: Pulmonary;  Laterality: Bilateral;    ALLERGIES: Allergies  Allergen Reactions   Amoxil [Amoxicillin] Hives    Has patient had a PCN reaction causing immediate rash, facial/tongue/throat swelling, SOB or lightheadedness with hypotension: Yes Has patient had a PCN reaction causing severe rash involving mucus membranes or skin necrosis: No Has patient had a PCN reaction that required hospitalization No Has patient had a PCN reaction occurring within the last 10 years: Yes If all of the above answers are "NO", then may proceed with Cephalosporin use.   Chantix [Varenicline] Other (See Comments)    Nightmares Hallucinations     Suboxone [Buprenorphine Hcl-Naloxone Hcl] Nausea And Vomiting   Emend [Fosaprepitant Dimeglumine] Itching    See notes for transfusion on 8/23. Pt reported new onset of itching after Emend infusion was started. 25mg  Benadryl IV given per MD. Pt tolerated remaining infusion well.     FAMILY HISTORY: Family History  Problem Relation Age  of Onset   Esophageal cancer Mother    COPD Mother    Cancer  Mother        lymphoma   Heart disease Mother    Stomach cancer Mother    Hyperlipidemia Father    Heart disease Father    Cancer Maternal Aunt        breast, colon   Crohn's disease Maternal Aunt    Hypertension Maternal Grandmother    Diabetes Maternal Grandmother    Hypertension Brother    Rectal cancer Neg Hx    Liver cancer Neg Hx    Colon cancer Neg Hx    Breast cancer Neg Hx     SOCIAL HISTORY: Social History   Tobacco Use   Smoking status: Every Day    Packs/day: 0.50    Years: 42.00    Additional pack years: 0.00    Total pack years: 21.00    Types: Cigarettes    Start date: 01/12/1979   Smokeless tobacco: Never   Tobacco comments:    08/14/11 "quit for 3 wk 05/2011; was smoking 1 ppd since age 70 before I quit; at least I've cut down to 1/4 ppd",  Smoking 3 per day 08/20/2021. 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 No distress. Cooperative with exam.  Skin: No obvious rash or jaundice. HEENT: Atraumatic. Anicteric. Lungs: Non-labored breathing on room air  Extremities: Mild peripheral edema. Psych: Affect appropriate.  Neurological: Mental Status: Alert. Speech fluent. No pseudobulbar affect Cranial Nerves: CNII: No RAPD. Visual fields intact. CNIII, IV, VI: PERRL. No nystagmus. EOMI. CN V: Facial  sensation intact bilaterally to fine touch. [place] clench strong. CN VII: Facial muscles symmetric and strong. No ptosis at rest. CN VIII: Hears finger rub well bilaterally. CN IX: No hypophonia. CN X: Palate elevates symmetrically. CN XI: Full strength shoulder shrug bilaterally. CN XII: Tongue protrusion full and midline. No atrophy or fasciculations. No significant dysarthria Motor: Tone is normal. Strength is 5/5 in bilateral upper and lower extremities Reflexes:  Right Left   Bicep 2+ 2+   Tricep 2+ 2+   BrRad 2+ 2+   Knee 2+ 2+   Ankle 2+ 2+    Pathological Reflexes: Babinski: flexor response bilaterally Hoffman: absent bilaterally Troemner: absent bilaterally Sensation: Pinprick: Absent over left 4th and 5th digits of foot, otherwise intact Vibration: Intact in all extremities Proprioception: Intact in bilateral great toes. Coordination: Intact finger-to- nose-finger bilaterally. Romberg negative. Gait: Normal, narrow-based gait. Able to walk on toes and heels.   Labs and Imaging review: Internal labs: Lab Results  Component Value Date   HGBA1C 6.2 02/02/2022   No results found for: "VITAMINB12" Lab Results  Component Value Date   TSH 2.814  02/17/2022   Lab Results  Component Value Date   ESRSEDRATE 24 08/31/2021   CMP (03/31/22): significant for glucose of 177 CBC (03/30/32): significant for Hb of 10.1 with MCV of 99.7 HbA1c (10/18/15): 7.7  Imaging: MRI brain w/wo contrast (01/26/22): FINDINGS: Brain: No abnormal enhancement. The suspicious area lateral to the frontal horn of the left lateral ventricle is not seen today.   Area of diffusion hyperintensity in the subcortical anterior right frontal lobe is persistent and from shine through, this area is dark on postcontrast assessment.   No incidental infarct, hemorrhage, hydrocephalus, or collection. Brain volume is normal   Vascular: Normal flow voids and vascular enhancement   Skull and upper  cervical spine: Regression of the avidly enhancing calvarial metastases seen previously. The 2 smaller are no longer seen and the midline lesion overlying the superior sagittal sinus is regressed to a 7 mm area of enhancement that is not directly contiguous with a vessel.   Sinuses/Orbits: Negative   IMPRESSION: 1. No intracranial enhancement to suggest metastatic disease, including at the left frontal white matter site highlighted previously. 2. Notable regression of calvarial metastases.  MRI lumbar spine wo contrast (07/23/14): IMPRESSION: Mild lumbar degenerative change without significant stenosis or neural impingement.  Assessment/Plan:  Cassidy [name] is a 57 y.o. female who presents for evaluation of burning pain in legs. 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 Her neurological examination is pertinent for mildly diminished sensation in feet. Available diagnostic data is significant for most recent HbA1c of 6.2. Patient's symptoms sound consistent with peripheral neuropathy, which she has had for many years likely due to DM. It has worsened with chemotherapy. 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 I will look for other treatable causes of neuropathy as below and attempt to manage symptoms.   PLAN: -Blood work: B1, B12, IFE -Have messaged patient's PCP Dr. Nita Sells to discuss the possibility of switching zoloft to cymbalta. I do not want to affect mood symptoms, but Cymbalta may be a good option to manage both as Cymbalta has the best data for chemotherapy induced neuropathy -Patient would prefer not to take gabapentin -Lidocaine cream PRN  -Return to clinic in 3 months  The impression above as well as the plan as outlined below were extensively discussed with the patient who  voiced understanding. All questions were answered to their satisfaction.  The patient was counseled on pertinent fall precautions per the printed material provided today, and as noted under the "Patient Instructions" section below.  When available, results of the above investigations and possible further recommendations will be communicated to the patient via telephone/MyChart. Patient to call office if not contacted after expected testing turnaround time.   Total time spent reviewing records, interview, history/exam, documentation, and coordination of care on day of encounter:  45 min   Thank you for allowing me to participate in patient's care.  If I can answer any additional questions, I would be pleased to do so.  Kai Levins, MD   CC: [name], DO [place] [id number]  CC: Referring provider: Heilingoetter, Tobe Sos, PA-C [place],  [place] [address]

## 2022-03-31 NOTE — Progress Notes (Signed)
[place] [phone]   [phone]  OFFICE PROGRESS NOTE  [name], DO [place] [address]  DIAGNOSIS: stage IV (T2a, N2, M1c) non-small cell lung cancer, adenocarcinoma presented with right upper lobe lung mass in addition to right hilar and mediastinal lymphadenopathy and innumerable bilateral pulmonary nodules as well as liver and bone metastasis diagnosed in August 2023.   Detected Alteration(s) / Biomarker(s) Associated FDA-approved therapies Clinical Trial Availability % cfDNA or Amplification  KRAS G12C  approved by FDA Adagrasib, Sotorasib Yes 5.3%  IDH1 R132L  approved in other indication Ivosidenib, Olutasidenib Yes 1.9%  PD-L1 expression 89%  PRIOR THERAPY: None  CURRENT THERAPY: Systemic chemotherapy with carboplatin for AUC of 5, Alimta 500 Mg/M2 and Keytruda 200 Mg IV every 3 weeks.  First dose September 02, 2021.  Status post 10 cycles.  Starting from cycle #5 she is on maintenance treatment with Alimta and Keytruda every 3 weeks.  INTERVAL HISTORY: Cassidy [name] 57 y.o. female returns to the clinic today for follow-up visit.  The patient is feeling fine today with no concerning complaints except for the persistent nausea and vomiting as well as early satiety the food is not moving out of her stomach as expected.  She denied having any current chest pain, shortness of breath, cough or hemoptysis.  She has some abdominal tenderness but no diarrhea or constipation.  She has no headache or visual changes.  She denied having any fever or chills.  She has repeat CT scan of the chest, abdomen and pelvis performed recently and she is here for evaluation and discussion of her scan results.  MEDICAL HISTORY: Past Medical History:  Diagnosis Date   Adenocarcinoma of right lung, stage 4 ([place]) 08/26/2021   Anemia    has sickle cell trait   Atherosclerotic heart disease of native coronary artery with angina  pectoris ([place]) 05/2012, 10/2013   a.  s/p PCTA to dRCA and ostial RPAV-PLA vessel + PDA branch (05/2012)  b. [place] s/p DES to mLAD - Resolute DES 3.0 x 22 (3.106mm -->3.3 mm)   Bipolar depression ([place])    Breast abscess    a. right side.    COPD (chronic obstructive pulmonary disease) (HCC)    Depression    Diabetic peripheral neuropathy (HCC)    Fibromyalgia    Genital warts    GERD (gastroesophageal reflux disease)    History of hiatal hernia    HLD (hyperlipidemia)    Hypertension    Pain in limb    a. LE VENOUS DUPLEX, 02/05/2009 - no evidence of deep vein thrombosis, Baker's cyst   Pneumonia    PTSD (post-traumatic stress disorder)    Sickle cell trait (HCC)    Tobacco abuse    Tooth caries    Type II diabetes mellitus (HCC)     ALLERGIES:  is allergic to amoxil [amoxicillin], chantix [varenicline], suboxone [buprenorphine hcl-naloxone hcl], and emend [fosaprepitant dimeglumine].  MEDICATIONS:  Current Outpatient Medications  Medication Sig Dispense Refill   aspirin EC 81 MG tablet Take 81 mg by mouth daily.     atorvastatin (LIPITOR) 40 MG tablet TAKE 1 TABLET (40 MG TOTAL) BY MOUTH DAILY. 90 tablet 3   busPIRone (BUSPAR) 15 MG tablet TAKE 1/2 TABLET (7.5 MG TOTAL) BY MOUTH TWICE DAILY (Patient taking differently: Take 7.5 mg by mouth 2 (two) times daily.) 30 tablet 11   Carboxymethylcellul-Glycerin (LUBRICATING EYE DROPS OP) Place 1 drop into both  eyes daily as needed (dry eyes).     carvedilol (COREG) 12.5 MG tablet TAKE 1 TABLET (12.5 MG TOTAL) BY MOUTH 2 (TWO) TIMES DAILY WITH A MEAL. 180 tablet 1   diclofenac Sodium (VOLTAREN) 1 % GEL Apply 1 application topically 3 times daily as needed for pain 2 g 3   ferrous sulfate 325 (65 FE) MG tablet Take 325 mg by mouth daily with breakfast.     fluticasone (FLONASE) 50 MCG/ACT nasal spray PLACE 1 SPRAY INTO BOTH NOSTRILS DAILY AS NEEDED (CONGESTION). 16 g 1   fluticasone-salmeterol (ADVAIR) 100-50 MCG/ACT AEPB Inhale 1 puff into the  lungs 2 (two) times daily. 1 each 3   folic acid (FOLVITE) 1 MG tablet TAKE 1 TABLET (1 MG TOTAL) BY MOUTH DAILY. 30 tablet 3   furosemide (LASIX) 40 MG tablet Take 1 tablet (40 mg total) by mouth daily as needed. If you gain 3 lbs in 24 hours or 5 lbs in 1 week 30 tablet 1   glucose blood (ACCU-CHEK AVIVA PLUS) test strip Use as instructed 50 each 1   hyoscyamine (LEVSIN SL) 0.125 MG SL tablet Place 1 tablet (0.125 mg total) under the tongue every 6 (six) hours as needed. For abdominal cramping 30 tablet 0   insulin detemir (LEVEMIR FLEXPEN) 100 UNIT/ML FlexPen Inject 18 Units into the skin 2 (two) times daily. 15 mL 1   Insulin Pen Needle 32G X 4 MM MISC 1 Needle by Does not apply route in the morning and at bedtime. 120 each 2   ipratropium (ATROVENT HFA) 17 MCG/ACT inhaler Inhale 2 puffs into the lungs every 4 (four) hours as needed for wheezing (COPD).     lactulose (CHRONULAC) 10 GM/15ML solution Take 30 mLs (20 g total) by mouth 2 (two) times daily as needed for mild constipation, moderate constipation or severe constipation. 236 mL 0   lidocaine-prilocaine (EMLA) cream APPLY 1 APPLICATION TOPICALLY AS NEEDED. 30 g 2   LORazepam (ATIVAN) 0.5 MG tablet Take 1 tablet (0.5 mg total) by mouth every 8 (eight) hours as needed for anxiety (nausea). 30 tablet 0   nicotine polacrilex (NICORETTE) 2 MG gum Take 1 each (2 mg total) by mouth as needed for smoking cessation. 100 tablet 0   nitroGLYCERIN (NITROSTAT) 0.4 MG SL tablet Place 1 tablet (0.4 mg total) under the tongue every 5 (five) minutes as needed for chest pain. 25 tablet 5   nystatin (MYCOSTATIN) 100000 UNIT/ML suspension Take 5 mLs (500,000 Units total) by mouth 4 (four) times daily. 60 mL 0   ondansetron (ZOFRAN-ODT) 8 MG disintegrating tablet Take 1 tablet (8 mg total) by mouth every 8 (eight) hours as needed for nausea or vomiting. (Patient not taking: Reported on 02/02/2022) 30 tablet 2   oxyCODONE (OXY IR/ROXICODONE) 5 MG immediate  release tablet Take 1-2 tablets (5-10 mg total) by mouth every 6 (six) hours as needed for severe pain. 90 tablet 0   oxyCODONE ER (XTAMPZA ER) 13.5 MG C12A Take 13.5 mg by mouth every 12 (twelve) hours. 60 capsule 0   pantoprazole (PROTONIX) 40 MG tablet TAKE 1 TABLET (40 MG TOTAL) BY MOUTH DAILY. 30 tablet 1   polyethylene glycol powder (GLYCOLAX/MIRALAX) 17 GM/SCOOP powder Take 17 g by mouth daily as needed for mild constipation or moderate constipation. 3350 g 1   potassium chloride SA (KLOR-CON M) 20 MEQ tablet Take 1 tablet (20 mEq total) by mouth daily as needed. On days you take Lasix 30 tablet 1  prochlorperazine (COMPAZINE) 10 MG tablet Take 1 tablet (10 mg total) by mouth every 6 (six) hours as needed for nausea or vomiting. 30 tablet 0   sertraline (ZOLOFT) 50 MG tablet TAKE 1 TABLET (50 MG TOTAL) BY MOUTH DAILY. 30 tablet 3   valACYclovir (VALTREX) 1000 MG tablet TAKE FOR 3 DAYS AS NEEDED FOR EACH OUTBREAK 30 tablet 11   vitamin B-12 (CYANOCOBALAMIN) 1000 MCG tablet Take 1,000 mcg by mouth daily.     No current facility-administered medications for this visit.    SURGICAL HISTORY:  Past Surgical History:  Procedure Laterality Date   BLADDER SURGERY  1980   "TVT"   BLADDER SURGERY  2003   "TVT"   BREAST EXCISIONAL BIOPSY     BREAST SURGERY Right    I&D for multiple abscesses   CARDIAC CATHETERIZATION     CORONARY ARTERY BYPASS GRAFT N/A 06/30/2021   Procedure: CORONARY ARTERY BYPASS GRAFTING (CABG) x3 USING LEFT INTERNAL MAMMARY ARTERY,  RIGHT RADIAL ARTERY AND LEFT GREATER SAPHENOUS VEIN;  Surgeon: Lajuana Matte, MD;  Location: [facility];  Service: Open Heart Surgery;  Laterality: N/A;   cutting balloon     ENDOVEIN HARVEST OF GREATER SAPHENOUS VEIN Left 06/30/2021   Procedure: ENDOVEIN HARVEST OF GREATER SAPHENOUS VEIN;  Surgeon: Lajuana Matte, MD;  Location: [facility];  Service: Open Heart Surgery;  Laterality: Left;   EYE SURGERY Left    laser surgery   FINE NEEDLE  ASPIRATION  08/21/2021   Procedure: FINE NEEDLE ASPIRATION (FNA) LINEAR;  Surgeon: Garner Nash, DO;  Location: [facility];  Service: Pulmonary;;   INCISE AND DRAIN ABCESS  ; 04/26/11; 08/13/11   right breast   INCISION AND DRAINAGE ABSCESS Right 05/09/2012   Procedure: INCISION AND DRAINAGE RIGHT BREAST ABSCESS;  Surgeon: Imogene Burn. Georgette Dover, MD;  Location: [facility];  Service: General;  Laterality: Right;   INCISION AND DRAINAGE ABSCESS Right 02/08/2014   Procedure: INCISION AND DRAINAGE RIGHT BREAST ABSCESS;  Surgeon: Donnie Mesa, MD;  Location: [facility];  Service: General;  Laterality: Right;   INCISION AND DRAINAGE ABSCESS Right 06/14/2014   Procedure: INCISION AND DRAINAGE RIGHT BREAST ABSCESS;  Surgeon: Donnie Mesa, MD;  Location: [facility];  Service: General;  Laterality: Right;   IR IMAGING GUIDED PORT INSERTION  12/08/2021   IRRIGATION AND DEBRIDEMENT ABSCESS  12/19/2010   Procedure: IRRIGATION AND DEBRIDEMENT ABSCESS;  Surgeon: Rolm Bookbinder, MD;  Location: [facility];  Service: General;  Laterality: Right;   IRRIGATION AND DEBRIDEMENT ABSCESS  08/13/2011   Procedure: MINOR INCISION AND DRAINAGE OF ABSCESS;  Surgeon: Gwenyth Ober, MD;  Location: [facility];  Service: General;  Laterality: Right;  Right Breast    IRRIGATION AND DEBRIDEMENT ABSCESS  11/17/2011   Procedure: IRRIGATION AND DEBRIDEMENT ABSCESS;  Surgeon: Imogene Burn. Georgette Dover, MD;  Location: [facility];  Service: General;  Laterality: Right;  irrigation and debridement right recurrent breast abscess   LARYNX SURGERY     LEFT HEART CATH AND CORONARY ANGIOGRAPHY N/A 04/09/2021   Procedure: LEFT HEART CATH AND CORONARY ANGIOGRAPHY;  Surgeon: Jettie Booze, MD;  Location: [facility];  Service: Cardiovascular;  Laterality: N/A;   LEFT HEART CATHETERIZATION WITH CORONARY ANGIOGRAM N/A 05/29/2012   Procedure: LEFT HEART CATHETERIZATION WITH CORONARY ANGIOGRAM & PTCA;  Surgeon: Troy Sine, MD;  Location[facility];  Service:  Cardiovascular; Bifurcation dRCA-RPAD/PLA & rPDA  PTCA.   LEFT HEART CATHETERIZATION WITH CORONARY ANGIOGRAM N/A 11/08/2013   Procedure:  LEFT HEART CATHETERIZATION WITH CORONARY ANGIOGRAM and Coronary Stent Intervention;  Surgeon: Leonie Man, MD;  Location: [facility];  Service: Cardiovascular; mLAD 80% (FFR 0.73) - resolute DES 3.0 x 22 mm (postdilated to 3.3 mm->3.5 mm)   NM MYOVIEW LTD  01/26/2009   Normal study, no evidence of ischemia, EF 67%   ployp removed from voice box 03/30/12     RADIAL ARTERY HARVEST Right 06/30/2021   Procedure: RADIAL ARTERY HARVEST;  Surgeon: Lajuana Matte, MD;  Location: [facility];  Service: Open Heart Surgery;  Laterality: Right;   REFRACTIVE SURGERY  ~ 2010   right   TEE WITHOUT CARDIOVERSION N/A 06/30/2021   Procedure: TRANSESOPHAGEAL ECHOCARDIOGRAM (TEE);  Surgeon: Lajuana Matte, MD;  Location: [facility];  Service: Open Heart Surgery;  Laterality: N/A;   TONSILLECTOMY  1988   VIDEO BRONCHOSCOPY WITH ENDOBRONCHIAL ULTRASOUND Bilateral 08/21/2021   Procedure: VIDEO BRONCHOSCOPY WITH ENDOBRONCHIAL ULTRASOUND;  Surgeon: Garner Nash, DO;  Location: [facility];  Service: Pulmonary;  Laterality: Bilateral;    REVIEW OF SYSTEMS:  Constitutional: positive for fatigue Eyes: negative Ears, nose, mouth, throat, and face: negative Respiratory: negative Cardiovascular: negative Gastrointestinal: positive for nausea and vomiting Genitourinary:negative Integument/breast: negative Hematologic/lymphatic: negative Musculoskeletal:negative Neurological: negative Behavioral/Psych: negative Endocrine: negative Allergic/Immunologic: negative   PHYSICAL EXAMINATION: General appearance: alert, cooperative, fatigued, and no distress Head: Normocephalic, without obvious abnormality, atraumatic Neck: no adenopathy, no JVD, supple, symmetrical, trachea midline, and thyroid not enlarged, symmetric, no tenderness/mass/nodules Lymph nodes: Cervical,  supraclavicular, and axillary nodes normal. Resp: clear to auscultation bilaterally Back: symmetric, no curvature. ROM normal. No CVA tenderness. Cardio: regular rate and rhythm, S1, S2 normal, no murmur, click, rub or gallop GI: Tenderness to palpation Extremities: extremities normal, atraumatic, no cyanosis or edema Neurologic: Alert and oriented X 3, normal strength and tone. Normal symmetric reflexes. Normal coordination and gait  ECOG PERFORMANCE STATUS: 1 - Symptomatic but completely ambulatory  Blood pressure 111/75, pulse 64, temperature 97.9 F (36.6 C), temperature source Oral, resp. rate 17, weight 204 lb 3 oz (92.6 kg), last menstrual period 06/12/2016, SpO2 100 %.  LABORATORY DATA: Lab Results  Component Value Date   WBC 5.2 03/31/2022   HGB 10.1 (L) 03/31/2022   HCT 29.1 (L) 03/31/2022   MCV 99.7 03/31/2022   PLT 272 03/31/2022      Chemistry      Component Value Date/Time   NA 141 03/10/2022 1138   NA 140 04/03/2021 1059   K 3.7 03/10/2022 1138   CL 108 03/10/2022 1138   CO2 28 03/10/2022 1138   BUN 8 03/10/2022 1138   BUN 13 04/03/2021 1059   CREATININE 0.75 03/10/2022 1138   CREATININE 0.65 11/06/2013 1520      Component Value Date/Time   CALCIUM 8.8 (L) 03/10/2022 1138   ALKPHOS 68 03/10/2022 1138   AST 34 03/10/2022 1138   ALT 41 03/10/2022 1138   BILITOT 0.4 03/10/2022 1138       RADIOGRAPHIC STUDIES: CT Chest W Contrast  Result Date: 03/30/2022 CLINICAL DATA:  Non-small-cell lung cancer metastatic disease. Adenocarcinoma of the right lung stage IV. * Tracking Code: BO * EXAM: CT CHEST, ABDOMEN, AND PELVIS WITH CONTRAST TECHNIQUE: Multidetector CT imaging of the chest, abdomen and pelvis was performed following the standard protocol during bolus administration of intravenous contrast. RADIATION DOSE REDUCTION: This exam was performed according to the departmental dose-optimization program which includes automated exposure control, adjustment of the  mA and/or kV according to patient size  and/or use of iterative reconstruction technique. CONTRAST:  164mL OMNIPAQUE IOHEXOL 300 MG/ML  SOLN COMPARISON:  CT 01/25/2022 and older FINDINGS: CT CHEST FINDINGS Cardiovascular: Patient is status post median sternotomy. Trace pericardial fluid. Heart is nonenlarged. Coronary artery calcifications are seen. Scattered vascular calcifications along the thoracic aorta and noncalcified plaque. Left upper chest port. Mediastinum/Nodes: No abnormal lymph node enlargement identified in the axillary region, left hilum. Small node in the right hilum is stable. There is some mild soft tissue edema of the mediastinal fat, nonspecific. Stable tiny cystic focus in the right thyroid lobe. Slightly patulous normal caliber thoracic esophagus. Lungs/Pleura: Spiculated right upper lobe nodule again identified. Previously this has a cavitary component which is improved. Lesion previously measured 19 x 9 mm and today on series 7, image 41 measures 18 x 8 mm, not significantly changed. No other dominant lung nodule identified. Dependent atelectasis. No consolidation, pneumothorax or effusion. Mild breathing motion. Musculoskeletal: Mild degenerative changes along the spine. CT ABDOMEN PELVIS FINDINGS Hepatobiliary: Once again there are low-attenuation hepatic lesions. These include segment 7, 8 and caudate lobe segment 1. The caudate lesion previously had maximal dimension of 2.3 cm and today 2.4 x 2.1 cm, similar. These have been present since at least 2017 and again felt to be benign lesions. Gallbladder is present. Patent portal vein. Pancreas: Unremarkable. No pancreatic ductal dilatation or surrounding inflammatory changes. Spleen: Normal in size without focal abnormality. Adrenals/Urinary Tract: Left adrenal gland is preserved. Right adrenal nodule again identified. Previously 2.9 x 2.1 cm and today again similar at 2.6 by 2.2 cm. Again long-term stability. The kidneys are unremarkable  without enhancing mass or collecting system dilatation. The ureters have normal course and caliber going down to the bladder. Bladder wall slightly thickened diffusely, unchanged from previous. Stomach/Bowel: On this non oral contrast exam, the large bowel has a normal course and caliber with scattered colonic stool. Normal appendix. Small bowel is nondilated. Stomach is nondilated. There are several loops of small bowel in the midabdomen which has slight wall thickening, nonspecific. There is mesenteric stranding and slight pelvic free fluid which is increased from previous. Vascular/Lymphatic: Normal caliber aorta and IVC with mild vascular calcifications. Circumaortic left renal vein. No specific abnormal lymph node enlargement identified in the abdomen and pelvis. Reproductive: Uterus is present.  No separate adnexal mass Other: Increased pelvic free fluid.  Still small.  Anasarca. Musculoskeletal: Scattered degenerative changes of the spine and pelvis. Stable areas of sclerosis along the lumbar spine and sacrum. There is a transitional S1 segment. IMPRESSION: Stable spiculated right upper lobe lung nodule. No developing new mass lesion, fluid collection or lymph node enlargement. Stable sclerotic bone lesions. Increasing mesenteric stranding and trace pelvic free fluid as well as some subtle areas of wall thickening along small bowel loops which are nondilated. Please correlate with any particular symptoms. Stable likely benign-appearing lesions involving the liver, right adrenal gland as previously described. Persistent wall thickening of the urinary bladder. Electronically Signed   By: Jill Side M.D.   On: 03/30/2022 11:52   CT Abdomen Pelvis W Contrast  Result Date: 03/30/2022 CLINICAL DATA:  Non-small-cell lung cancer metastatic disease. Adenocarcinoma of the right lung stage IV. * Tracking Code: BO * EXAM: CT CHEST, ABDOMEN, AND PELVIS WITH CONTRAST TECHNIQUE: Multidetector CT imaging of the chest,  abdomen and pelvis was performed following the standard protocol during bolus administration of intravenous contrast. RADIATION DOSE REDUCTION: This exam was performed according to the departmental dose-optimization program which includes automated exposure  control, adjustment of the mA and/or kV according to patient size and/or use of iterative reconstruction technique. CONTRAST:  174mL OMNIPAQUE IOHEXOL 300 MG/ML  SOLN COMPARISON:  CT 01/25/2022 and older FINDINGS: CT CHEST FINDINGS Cardiovascular: Patient is status post median sternotomy. Trace pericardial fluid. Heart is nonenlarged. Coronary artery calcifications are seen. Scattered vascular calcifications along the thoracic aorta and noncalcified plaque. Left upper chest port. Mediastinum/Nodes: No abnormal lymph node enlargement identified in the axillary region, left hilum. Small node in the right hilum is stable. There is some mild soft tissue edema of the mediastinal fat, nonspecific. Stable tiny cystic focus in the right thyroid lobe. Slightly patulous normal caliber thoracic esophagus. Lungs/Pleura: Spiculated right upper lobe nodule again identified. Previously this has a cavitary component which is improved. Lesion previously measured 19 x 9 mm and today on series 7, image 41 measures 18 x 8 mm, not significantly changed. No other dominant lung nodule identified. Dependent atelectasis. No consolidation, pneumothorax or effusion. Mild breathing motion. Musculoskeletal: Mild degenerative changes along the spine. CT ABDOMEN PELVIS FINDINGS Hepatobiliary: Once again there are low-attenuation hepatic lesions. These include segment 7, 8 and caudate lobe segment 1. The caudate lesion previously had maximal dimension of 2.3 cm and today 2.4 x 2.1 cm, similar. These have been present since at least 2017 and again felt to be benign lesions. Gallbladder is present. Patent portal vein. Pancreas: Unremarkable. No pancreatic ductal dilatation or surrounding  inflammatory changes. Spleen: Normal in size without focal abnormality. Adrenals/Urinary Tract: Left adrenal gland is preserved. Right adrenal nodule again identified. Previously 2.9 x 2.1 cm and today again similar at 2.6 by 2.2 cm. Again long-term stability. The kidneys are unremarkable without enhancing mass or collecting system dilatation. The ureters have normal course and caliber going down to the bladder. Bladder wall slightly thickened diffusely, unchanged from previous. Stomach/Bowel: On this non oral contrast exam, the large bowel has a normal course and caliber with scattered colonic stool. Normal appendix. Small bowel is nondilated. Stomach is nondilated. There are several loops of small bowel in the midabdomen which has slight wall thickening, nonspecific. There is mesenteric stranding and slight pelvic free fluid which is increased from previous. Vascular/Lymphatic: Normal caliber aorta and IVC with mild vascular calcifications. Circumaortic left renal vein. No specific abnormal lymph node enlargement identified in the abdomen and pelvis. Reproductive: Uterus is present.  No separate adnexal mass Other: Increased pelvic free fluid.  Still small.  Anasarca. Musculoskeletal: Scattered degenerative changes of the spine and pelvis. Stable areas of sclerosis along the lumbar spine and sacrum. There is a transitional S1 segment. IMPRESSION: Stable spiculated right upper lobe lung nodule. No developing new mass lesion, fluid collection or lymph node enlargement. Stable sclerotic bone lesions. Increasing mesenteric stranding and trace pelvic free fluid as well as some subtle areas of wall thickening along small bowel loops which are nondilated. Please correlate with any particular symptoms. Stable likely benign-appearing lesions involving the liver, right adrenal gland as previously described. Persistent wall thickening of the urinary bladder. Electronically Signed   By: Jill Side M.D.   On: 03/30/2022  11:52    ASSESSMENT AND PLAN: This is a very pleasant 57 years old African-American female recently diagnosed with a stage IV (T2 a, N2, M1 C) non-small cell lung cancer, adenocarcinoma presented with right upper lobe lung mass in addition to right hilar and mediastinal lymphadenopathy as well as innumerable bilateral pulmonary nodules and bone and liver metastasis diagnosed in August 2023 with positive KRAS G12C  mutation and PD-L1 expression of 89%. She is currently undergoing systemic chemotherapy with carboplatin for AUC of 5, Alimta 500 Mg/M2 and Keytruda 200 Mg IV every 3 weeks status post 10 cycles.  The patient has been tolerating this treatment fairly well except for the frequent nausea and vomiting.  She is currently on Zofran ODT with some improvement.  She also has early satiety and interested in seeing a gastroenterologist for further evaluation of her condition.  I will make referral to gastroenterology. She had repeat CT scan of the chest, abdomen and pelvis performed recently.  I personally and independently reviewed the scan and discussed the result with the patient today. Her scan showed no concerning findings for disease progression but there was increasing mesenteric stranding and trace pelvic free fluid with areas of wall thickening along the small bowel loops which are not dilated. I recommended for the patient to continue her current treatment with maintenance Alimta and Keytruda and she will proceed with cycle #11 today. For the dyspepsia and epigastric pain, she will continue her current treatment with Protonix. For the pain management she is followed by the palliative care team and currently on Xtampza ER 13.5 mg every 12 hours in addition to Oxy IR for breakthrough pain. The patient will come back for follow-up visit in 3 weeks for evaluation before starting cycle #12. She was advised to call immediately if she has any other concerning symptoms in the interval. The patient  voices understanding of current disease status and treatment options and is in agreement with the current care plan.  All questions were answered. The patient knows to call the clinic with any problems, questions or concerns. We can certainly see the patient much sooner if necessary. The total time spent in the appointment was 30 minutes.  Disclaimer: This note was dictated with voice recognition software. Similar sounding words can inadvertently be transcribed and may not be corrected upon review.

## 2022-03-31 NOTE — Patient Instructions (Signed)
-   in addition to mirilax ick up and take over the counter senna-s and take twice a day - if you have loose stools back down to once a day -

## 2022-03-31 NOTE — Progress Notes (Addendum)
Patient seen by MD today  Vitals are within treatment parameters.  Labs reviewed: and are within treatment parameters.  Per physician team, patient is ready for treatment and there are NO modifications to the treatment plan.  

## 2022-04-05 NOTE — Progress Notes (Signed)
Cassidy [name], counseling intern, met with patient for their last scheduled counseling session.   Patient spoke about a baby shower they attended last weekend. The patient reflected sometimes, even when they don't feel like having company, spending time with people they love leaves them feel more energized and fulfilled.   The patient shared their goal this week is to call the list of resources given to them by the counselor to set up a counseling session with a new therapist.   Cassidy [name],  Counseling Intern  [phone] [email or web address]

## 2022-04-07 ENCOUNTER — Ambulatory Visit: Payer: Medicaid Other | Admitting: Neurology

## 2022-04-07 ENCOUNTER — Telehealth: Payer: Self-pay | Admitting: Neurology

## 2022-04-07 ENCOUNTER — Other Ambulatory Visit (INDEPENDENT_AMBULATORY_CARE_PROVIDER_SITE_OTHER): Payer: Medicaid Other

## 2022-04-07 ENCOUNTER — Encounter: Payer: Self-pay | Admitting: Neurology

## 2022-04-07 VITALS — BP 95/63 | HR 70 | Ht 67.5 in | Wt 200.0 lb

## 2022-04-07 DIAGNOSIS — R209 Unspecified disturbances of skin sensation: Secondary | ICD-10-CM | POA: Diagnosis not present

## 2022-04-07 DIAGNOSIS — E1142 Type 2 diabetes mellitus with diabetic polyneuropathy: Secondary | ICD-10-CM

## 2022-04-07 DIAGNOSIS — G620 Drug-induced polyneuropathy: Secondary | ICD-10-CM

## 2022-04-07 DIAGNOSIS — R4589 Other symptoms and signs involving emotional state: Secondary | ICD-10-CM

## 2022-04-07 DIAGNOSIS — T451X5A Adverse effect of antineoplastic and immunosuppressive drugs, initial encounter: Secondary | ICD-10-CM

## 2022-04-07 LAB — VITAMIN B12: Vitamin B-12: 822 pg/mL (ref 211–911)

## 2022-04-07 NOTE — Patient Instructions (Addendum)
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  I would like to do lab work today.  We discussed gabapentin but you would rather look at other options.  I will talk to your primary doctor about switching your Zoloft to Cymbalta.   I will be in touch when I have your results of the labs and about the medication switch.  You can also try Lidocaine cream as needed. Apply wear you have pain, tingling, or burning. Wear gloves to prevent your hands being numb. This can be bought over the counter at any drug store or online.  The physicians and staff at [facility] are committed to providing excellent care. You may receive a survey requesting feedback about your experience at our office. We strive to receive "very good" responses to the survey questions. If you feel that your experience would prevent you from giving the office a "very good " response, please contact our office to try to remedy the situation. We may be reached at [phone]. Thank you for taking the time out of your busy day to complete the survey.  Kai Levins, MD [facility]  Preventing Falls at [facility] are common, often dreaded events in the lives of older people. Aside from the obvious injuries and even death that may result, fall can cause wide-ranging consequences including loss of independence, mental decline, decreased activity and mobility. Younger people are also at risk of falling, especially those with chronic illnesses and fatigue.  Ways to reduce risk for falling Examine diet and medications. Warm foods and alcohol dilate blood vessels, which can lead to dizziness when standing. Sleep aids, antidepressants and pain medications can also increase the likelihood of a fall.  Get a vision exam. Poor vision, cataracts and glaucoma increase the chances of falling.  Check foot gear. Shoes should fit snugly and have a sturdy, nonskid sole and a broad, low heel  Participate in a  physician-approved exercise program to build and maintain muscle strength and improve balance and coordination. Programs that use ankle weights or stretch bands are excellent for muscle-strengthening. Water aerobics programs and low-impact Tai Chi programs have also been shown to improve balance and coordination.  Increase vitamin D intake. Vitamin D improves muscle strength and increases the amount of calcium the body is able to absorb and deposit in bones.  How to prevent falls from common hazards Floors - Remove all loose wires, cords, and throw rugs. Minimize clutter. Make sure rugs are anchored and smooth. Keep furniture in its usual place.  Chairs -- Use chairs with straight backs, armrests and firm seats. Add firm cushions to existing pieces to add height.  Bathroom - Install grab bars and non-skid tape in the tub or shower. Use a bathtub transfer bench or a shower chair with a back support Use an elevated toilet seat and/or safety rails to assist standing from a low surface. Do not use towel racks or bathroom tissue holders to help you stand.  Lighting - Make sure halls, stairways, and entrances are well-lit. Install a night light in your bathroom or hallway. Make sure there is a light switch at the top and bottom of the staircase. Turn lights on if you get up in the middle of the night. Make sure lamps or light switches are within reach of the bed if you have to get up during the night.  Kitchen - Install non-skid rubber mats near the sink and stove. Clean spills immediately. Store frequently used utensils,  pots, pans between waist and eye level. This helps prevent reaching and bending. Sit when getting things out of lower cupboards.  Living room/ Bedrooms - Place furniture with wide spaces in between, giving enough room to move around. Establish a route through the living room that gives you something to hold onto as you walk.  Stairs - Make sure treads, rails, and rugs are secure. Install  a rail on both sides of the stairs. If stairs are a threat, it might be helpful to arrange most of your activities on the lower level to reduce the number of times you must climb the stairs.  Entrances and doorways - Install metal handles on the walls adjacent to the doorknobs of all doors to make it more secure as you travel through the doorway.  Tips for maintaining balance Keep at least one hand free at all times. Try using a backpack or fanny pack to hold things rather than carrying them in your hands. Never carry objects in both hands when walking as this interferes with keeping your balance.  Attempt to swing both arms from front to back while walking. This might require a conscious effort if Parkinson's disease has diminished your movement. It will, however, help you to maintain balance and posture, and reduce fatigue.  Consciously lift your feet off of the ground when walking. Shuffling and dragging of the feet is a common culprit in losing your balance.  When trying to navigate turns, use a "U" technique of facing forward and making a wide turn, rather than pivoting sharply.  Try to stand with your feet shoulder-length apart. When your feet are close together for any length of time, you increase your risk of losing your balance and falling.  Do one thing at a time. Don't try to walk and accomplish another task, such as reading or looking around. The decrease in your automatic reflexes complicates motor function, so the less distraction, the better.  Do not wear rubber or gripping soled shoes, they might "catch" on the floor and cause tripping.  Move slowly when changing positions. Use deliberate, concentrated movements and, if needed, use a grab bar or walking aid. Count 15 seconds between each movement. For example, when rising from a seated position, wait 15 seconds after standing to begin walking.  If balance is a continuous problem, you might want to consider a walking aid such as a  cane, walking stick, or walker. Once you've mastered walking with help, you might be ready to try it on your own again.

## 2022-04-07 NOTE — Telephone Encounter (Signed)
Attempted to call patient, but left message. PCP okay with switching Zoloft to Cymbalta, so I was going to discuss with patient.  Kai Levins, MD [facility]

## 2022-04-08 ENCOUNTER — Telehealth: Payer: Self-pay | Admitting: Internal Medicine

## 2022-04-08 NOTE — Telephone Encounter (Signed)
Called patient regarding upcoming April-May appointments, patient is notified. 

## 2022-04-12 ENCOUNTER — Other Ambulatory Visit: Payer: Self-pay | Admitting: Nurse Practitioner

## 2022-04-12 ENCOUNTER — Telehealth: Payer: Self-pay

## 2022-04-12 ENCOUNTER — Other Ambulatory Visit: Payer: Self-pay | Admitting: Family Medicine

## 2022-04-12 DIAGNOSIS — G893 Neoplasm related pain (acute) (chronic): Secondary | ICD-10-CM

## 2022-04-12 DIAGNOSIS — Z515 Encounter for palliative care: Secondary | ICD-10-CM

## 2022-04-12 DIAGNOSIS — C3491 Malignant neoplasm of unspecified part of right bronchus or lung: Secondary | ICD-10-CM

## 2022-04-12 NOTE — Telephone Encounter (Signed)
Pt called for refill, see new orders.  

## 2022-04-14 LAB — IMMUNOFIXATION ELECTROPHORESIS
IgG (Immunoglobin G), Serum: 1222 mg/dL (ref 600–1640)
IgM, Serum: 34 mg/dL — ABNORMAL LOW (ref 50–300)
Immunoglobulin A: 226 mg/dL (ref 47–310)

## 2022-04-14 LAB — EXTRA SPECIMEN

## 2022-04-14 LAB — VITAMIN B1

## 2022-04-16 ENCOUNTER — Encounter: Payer: Self-pay | Admitting: Internal Medicine

## 2022-04-16 NOTE — Progress Notes (Signed)
err

## 2022-04-19 NOTE — Progress Notes (Unsigned)
[facility] OFFICE PROGRESS NOTE  [name], DO [address] [place] [address]  DIAGNOSIS: Stage IV (T2a, N2, M1c) non-small cell lung cancer, adenocarcinoma presented with right upper lobe lung mass in addition to right hilar and mediastinal lymphadenopathy and innumerable bilateral pulmonary nodules as well as liver and bone metastasis.  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    Detected Alteration(s) / Biomarker(s) Associated FDA-approved therapies  Clinical Trial Availability      % cfDNA or Amplification   KRAS G12C  approved by FDA Adagrasib, Sotorasib Yes   5.3%   IDH1 R132L  approved in other indication Ivosidenib, Olutasidenib Yes      1.9%   PD-L1 expression 89%   PRIOR THERAPY: Palliative radiation to the painful metastatic bone lesions at L4 and the sacrum under the care of Dr. Mitzi Hansen. Last day on 10/22/21   CURRENT THERAPY: 1) Systemic chemotherapy with carboplatin for AUC of 5, Alimta 500 Mg/M2 and Keytruda 200 Mg IV every 3 weeks.  First dose September 02, 2021.  Status post 9 cycles.  Starting from cycle #5, the patient will start maintenance Alimta and Keytruda IV every 3 weeks. Starting from cycle #10, reduced the dose of Alimta to 400 mg/m2 due to intolerance   INTERVAL HISTORY: MARLINA CATALDI 57 y.o. female returns to the clinic today for a follow-up visit.  The patient was last seen by Dr. [name] 3 weeks ago.  When I last saw the patient in February, the patient was not in a good place emotionally with her depression.  She has also been isolating herself from loved ones.  I had referred her to psychiatry. She has an appointment next week. Additionally, she was given information on the support groups and was previously following with a Veterinary surgeon.  She also sees palliative care. I also discussed the balance between stability and treating her lung cancer and quality of life.  We have discussed on several occasions several options  including reducing the dose of chemotherapy or discontinuing chemotherapy altogether and continuing maintenance immunotherapy single agent.  She is always fearful to discontinue or reduce the dose of her chemotherapy due to it being effective in achieving disease control at this time. She did decide to proceed with immunotherapy with slight dose reduction of her chemotherapy.   Overall, since I saw her last time, she is in a much better place emotionally.  She has been trying to spend time with loved ones and doing activities that she enjoys.  She also feels like the fatigue and nausea do not last as long as before.   In the interval since last being seen, she has establish care with neurology which she was referred to for recurrent migraines and headaches.  Her migraines have improved.  She has neuropathy for which her Zoloft was changed to Cymbalta which will be effective with her depression and neuropathy.  The patient is hoping to stay away from taking gabapentin. She is going to finish out her current prescription of Zoloft before transitioning to Cymbalta.   Her main concern today is related to increased reflux when she lays down at nighttime and having dysphagia with taking her pills in the morning.  She was previously seen for her colonoscopies by [facility] in she is going to call them to make a follow-up appointment.  She is compliant with her Protonix that any significant improvement.  She also tries to take her sublingual Zofran in the morning prior  to taking medicines.  She is trying to avoid overeating.  She does take 2 to 3 pills at a time in the morning of her morning medications.  She reports that she feels like she has "acid bubbles" coming up in the morning causing her to vomit.  The symptoms above interfere with her desire to eat/appetite.  For constipation, the patient states that she has been taking the stool softener daily which has helped.  She is also trying to consume more prune  juice.  She sometimes has gas pain in the left upper quadrant of her abdomen.  Otherwise she denies any fever, chills, or night sweats.  She denies any changes in her breathing.  Denies any cough, chest pain, or hemoptysis.  She is scheduled to see palliative care today for which she is currently on Xtampza extended release and oxycodone for breakthrough pain.  She told the infusion room nurse that she has some right cheek/eyelid swelling. She had some swelling in her legs and hands which improved with lasix. She ran out of her lasix a few days ago. She has a prior injury to her right jaw. No erythema, throat swelling, or dental infection. Denies pain. She also has watery eyes which causes blurry vision. She is here today for evaluation and repeat blood work before undergoing cycle number #12.   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  s/p PCTA to dRCA and ostial RPAV-PLA vessel + PDA branch (05/2012)  b. [place] s/p DES to mLAD - Resolute DES 3.0 x 22 (3.32mm -->3.3 mm)   Bipolar depression (HCC)    Breast abscess    a. right side.    COPD (chronic obstructive pulmonary disease) (HCC)    Depression    Diabetic peripheral neuropathy (HCC)    Fibromyalgia    Genital warts    GERD (gastroesophageal reflux disease)    History of hiatal hernia    HLD (hyperlipidemia)    Hypertension    Pain in limb    a. LE VENOUS DUPLEX, 02/05/2009 - no evidence of deep vein thrombosis, Baker's cyst   Pneumonia    PTSD (post-traumatic stress disorder)    Sickle cell trait (HCC)    Tobacco abuse    Tooth caries    Type II diabetes mellitus (HCC)     ALLERGIES:  is allergic to amoxil [amoxicillin], chantix [varenicline], suboxone [buprenorphine hcl-naloxone hcl], and emend [fosaprepitant  dimeglumine].  MEDICATIONS:  Current Outpatient Medications  Medication Sig Dispense Refill   aspirin EC 81 MG tablet Take 81 mg by mouth daily.     atorvastatin (LIPITOR) 40 MG tablet TAKE 1 TABLET (40 MG TOTAL) BY MOUTH DAILY. 90 tablet 3   busPIRone (BUSPAR) 15 MG tablet TAKE 1/2 TABLET (7.5 MG TOTAL) BY MOUTH TWICE DAILY (Patient taking differently: Take 7.5 mg by mouth 2 (two) times daily.) 30 tablet 11   Carboxymethylcellul-Glycerin (LUBRICATING EYE DROPS OP) Place 1 drop into both eyes daily as needed (dry eyes).     carvedilol (COREG) 12.5 MG tablet TAKE 1 TABLET (12.5 MG TOTAL) BY MOUTH 2 (TWO) TIMES DAILY WITH A MEAL. 180 tablet 1   diclofenac Sodium (VOLTAREN) 1 % GEL Apply 1 application topically 3 times daily as needed for pain 2 g 3  ferrous sulfate 325 (65 FE) MG tablet Take 325 mg by mouth daily with breakfast.     fluticasone (FLONASE) 50 MCG/ACT nasal spray PLACE 1 SPRAY INTO BOTH NOSTRILS DAILY AS NEEDED (CONGESTION). 16 g 1   fluticasone-salmeterol (ADVAIR) 100-50 MCG/ACT AEPB Inhale 1 puff into the lungs 2 (two) times daily. 1 each 3   folic acid (FOLVITE) 1 MG tablet Take 1 tablet (1 mg total) by mouth daily. 30 tablet 3   furosemide (LASIX) 40 MG tablet Take 1 tablet (40 mg total) by mouth daily as needed. If you gain 3 lbs in 24 hours or 5 lbs in 1 week 30 tablet 1   glucose blood (ACCU-CHEK AVIVA PLUS) test strip Use as instructed 50 each 1   hyoscyamine (LEVSIN SL) 0.125 MG SL tablet Place 1 tablet (0.125 mg total) under the tongue every 6 (six) hours as needed. For abdominal cramping 30 tablet 0   insulin detemir (LEVEMIR FLEXPEN) 100 UNIT/ML FlexPen Inject 18 Units into the skin 2 (two) times daily. 15 mL 1   Insulin Pen Needle 32G X 4 MM MISC 1 Needle by Does not apply route in the morning and at bedtime. 120 each 2   ipratropium (ATROVENT HFA) 17 MCG/ACT inhaler Inhale 2 puffs into the lungs every 4 (four) hours as needed for wheezing (COPD).     lactulose  (CHRONULAC) 10 GM/15ML solution Take 30 mLs (20 g total) by mouth 2 (two) times daily as needed for mild constipation, moderate constipation or severe constipation. 236 mL 0   lidocaine-prilocaine (EMLA) cream APPLY 1 APPLICATION TOPICALLY AS NEEDED. 30 g 2   LORazepam (ATIVAN) 0.5 MG tablet Take 1 tablet (0.5 mg total) by mouth every 8 (eight) hours as needed for anxiety (nausea). 30 tablet 0   metoCLOPramide (REGLAN) 10 MG tablet Take 1 tablet (10 mg total) by mouth 3 (three) times daily before meals. 30 tablet 0   nicotine polacrilex (NICORETTE) 2 MG gum Take 1 each (2 mg total) by mouth as needed for smoking cessation. 100 tablet 0   nitroGLYCERIN (NITROSTAT) 0.4 MG SL tablet Place 1 tablet (0.4 mg total) under the tongue every 5 (five) minutes as needed for chest pain. 25 tablet 5   nystatin (MYCOSTATIN) 100000 UNIT/ML suspension Take 5 mLs (500,000 Units total) by mouth 4 (four) times daily. 60 mL 0   ondansetron (ZOFRAN-ODT) 8 MG disintegrating tablet Take 1 tablet (8 mg total) by mouth every 8 (eight) hours as needed for nausea or vomiting. (Patient not taking: Reported on 02/02/2022) 30 tablet 2   oxyCODONE (OXY IR/ROXICODONE) 5 MG immediate release tablet Take 1-2 tablets (5-10 mg total) by mouth every 4 (four) hours as needed for severe pain. 90 tablet 0   oxyCODONE ER (XTAMPZA ER) 13.5 MG C12A Take 13.5 mg by mouth every 12 (twelve) hours. 60 capsule 0   pantoprazole (PROTONIX) 40 MG tablet TAKE 1 TABLET (40 MG TOTAL) BY MOUTH DAILY. 30 tablet 1   polyethylene glycol powder (GAVILAX) 17 GM/SCOOP powder TAKE ONE CAPFUL (17 GRAMS )BY MOUTH DAILY AS NEEDED FOR MILD OR MODERATE CONSTIPATION. 510 g 1   potassium chloride SA (KLOR-CON M) 20 MEQ tablet Take 1 tablet (20 mEq total) by mouth daily as needed. On days you take Lasix 30 tablet 1   prochlorperazine (COMPAZINE) 10 MG tablet Take 1 tablet (10 mg total) by mouth every 6 (six) hours as needed for nausea or vomiting. 30 tablet 0   sertraline  (ZOLOFT) 50 MG  tablet TAKE 1 TABLET (50 MG TOTAL) BY MOUTH DAILY. 30 tablet 3   valACYclovir (VALTREX) 1000 MG tablet TAKE FOR 3 DAYS AS NEEDED FOR EACH OUTBREAK 30 tablet 11   vitamin B-12 (CYANOCOBALAMIN) 1000 MCG tablet Take 1,000 mcg by mouth daily.     No current facility-administered medications for this visit.   Facility-Administered Medications Ordered in Other Visits  Medication Dose Route Frequency Provider Last Rate Last Admin   cyanocobalamin (VITAMIN B12) injection 1,000 mcg  1,000 mcg Intramuscular Once Si Gaul, MD       pembrolizumab [facility]) 200 mg in sodium chloride 0.9 % 50 mL chemo infusion  200 mg Intravenous Once Si Gaul, MD       PEMEtrexed (ALIMTA) 800 mg in sodium chloride 0.9 % 100 mL chemo infusion  400 mg/m2 (Treatment Plan Recorded) Intravenous Once Si Gaul, MD        SURGICAL HISTORY:  Past Surgical History:  Procedure Laterality Date   BLADDER SURGERY  1980   "TVT"   BLADDER SURGERY  2003   "TVT"   BREAST EXCISIONAL BIOPSY     BREAST SURGERY Right    I&D for multiple abscesses   CARDIAC CATHETERIZATION     CORONARY ARTERY BYPASS GRAFT N/A 06/30/2021   Procedure: CORONARY ARTERY BYPASS GRAFTING (CABG) x3 USING LEFT INTERNAL MAMMARY ARTERY,  RIGHT RADIAL ARTERY AND LEFT GREATER SAPHENOUS VEIN;  Surgeon: Corliss Skains, MD;  Location: [facility] OR;  Service: Open Heart Surgery;  Laterality: N/A;   cutting balloon     ENDOVEIN HARVEST OF GREATER SAPHENOUS VEIN Left 06/30/2021   Procedure: ENDOVEIN HARVEST OF GREATER SAPHENOUS VEIN;  Surgeon: Corliss Skains, MD;  Location: [facility] OR;  Service: Open Heart Surgery;  Laterality: Left;   EYE SURGERY Left    laser surgery   FINE NEEDLE ASPIRATION  08/21/2021   Procedure: FINE NEEDLE ASPIRATION (FNA) LINEAR;  Surgeon: Josephine Igo, DO;  Location: [facility] ENDOSCOPY;  Service: Pulmonary;;   INCISE AND DRAIN ABCESS  ; 04/26/11; 08/13/11   right breast   INCISION AND DRAINAGE ABSCESS Right  05/09/2012   Procedure: INCISION AND DRAINAGE RIGHT BREAST ABSCESS;  Surgeon: Wilmon Arms. Corliss Skains, MD;  Location: [facility] OR;  Service: General;  Laterality: Right;   INCISION AND DRAINAGE ABSCESS Right 02/08/2014   Procedure: INCISION AND DRAINAGE RIGHT BREAST ABSCESS;  Surgeon: Manus Rudd, MD;  Location: [facility] OR;  Service: General;  Laterality: Right;   INCISION AND DRAINAGE ABSCESS Right 06/14/2014   Procedure: INCISION AND DRAINAGE RIGHT BREAST ABSCESS;  Surgeon: Manus Rudd, MD;  Location: [facility] OR;  Service: General;  Laterality: Right;   IR IMAGING GUIDED PORT INSERTION  12/08/2021   IRRIGATION AND DEBRIDEMENT ABSCESS  12/19/2010   Procedure: IRRIGATION AND DEBRIDEMENT ABSCESS;  Surgeon: Emelia Loron, MD;  Location: [facility] OR;  Service: General;  Laterality: Right;   IRRIGATION AND DEBRIDEMENT ABSCESS  08/13/2011   Procedure: MINOR INCISION AND DRAINAGE OF ABSCESS;  Surgeon: Cherylynn Ridges, MD;  Location: [facility] OR;  Service: General;  Laterality: Right;  Right Breast    IRRIGATION AND DEBRIDEMENT ABSCESS  11/17/2011   Procedure: IRRIGATION AND DEBRIDEMENT ABSCESS;  Surgeon: Wilmon Arms. Corliss Skains, MD;  Location: [facility] OR;  Service: General;  Laterality: Right;  irrigation and debridement right recurrent breast abscess   LARYNX SURGERY     LEFT HEART CATH AND CORONARY ANGIOGRAPHY N/A 04/09/2021   Procedure: LEFT HEART CATH AND CORONARY ANGIOGRAPHY;  Surgeon: Corky Crafts, MD;  Location: [facility] INVASIVE  CV LAB;  Service: Cardiovascular;  Laterality: N/A;   LEFT HEART CATHETERIZATION WITH CORONARY ANGIOGRAM N/A 05/29/2012   Procedure: LEFT HEART CATHETERIZATION WITH CORONARY ANGIOGRAM & PTCA;  Surgeon: Lennette Bihari, MD;  Location: [facility] CATH LAB;  Service: Cardiovascular; Bifurcation dRCA-RPAD/PLA & rPDA  PTCA.   LEFT HEART CATHETERIZATION WITH CORONARY ANGIOGRAM N/A 11/08/2013   Procedure: LEFT HEART CATHETERIZATION WITH CORONARY ANGIOGRAM and Coronary Stent Intervention;  Surgeon: Marykay Lex, MD;   Location: [facility] CATH LAB;  Service: Cardiovascular; mLAD 80% (FFR 0.73) - resolute DES 3.0 x 22 mm (postdilated to 3.3 mm->3.5 mm)   NM MYOVIEW LTD  01/26/2009   Normal study, no evidence of ischemia, EF 67%   ployp removed from voice box 03/30/12     RADIAL ARTERY HARVEST Right 06/30/2021   Procedure: RADIAL ARTERY HARVEST;  Surgeon: Corliss Skains, MD;  Location: [facility];  Service: Open Heart Surgery;  Laterality: Right;   REFRACTIVE SURGERY  ~ 2010   right   TEE WITHOUT CARDIOVERSION N/A 06/30/2021   Procedure: TRANSESOPHAGEAL ECHOCARDIOGRAM (TEE);  Surgeon: Corliss Skains, MD;  Location: [facility] OR;  Service: Open Heart Surgery;  Laterality: N/A;   TONSILLECTOMY  1988   VIDEO BRONCHOSCOPY WITH ENDOBRONCHIAL ULTRASOUND Bilateral 08/21/2021   Procedure: VIDEO BRONCHOSCOPY WITH ENDOBRONCHIAL ULTRASOUND;  Surgeon: Josephine Igo, DO;  Location: [facility];  Service: Pulmonary;  Laterality: Bilateral;    REVIEW OF SYSTEMS:   Review of Systems  Constitutional: Positive for stable fatigue.  Positive for intermittent decreased appetite.  Negative for chills and fever.  HENT: The patient feels her right face/cheek is more swollen. Positive for trouble swallowing in the morning.  Negative for mouth sores, nosebleeds,  or sore throat.  Eyes: Positive for watery eyes. Negative for eye problems and icterus.  Respiratory: Negative for cough, hemoptysis, shortness of breath and wheezing.   Cardiovascular: Negative for chest pain and leg swelling.  Gastrointestinal: Positive for intermittent nausea vomiting and constipation.  Negative for diarrhea.  Genitourinary: Negative for bladder incontinence, difficulty urinating, dysuria, frequency and hematuria.   Musculoskeletal: Positive for chronic back pain.  Negative for gait problem, neck pain and neck stiffness.  Skin: Negative for itching and rash.  Neurological: Negative for dizziness, extremity weakness, gait problem, headaches, light-headedness and  seizures.  Hematological: Negative for adenopathy. Does not bruise/bleed easily.  Psychiatric/Behavioral: Negative for confusion, depression and sleep disturbance. The patient is not nervous/anxious.     PHYSICAL EXAMINATION:  Blood pressure 134/78, pulse 75, temperature 98.3 F (36.8 C), resp. rate 18, weight 201 lb 8 oz (91.4 kg), last menstrual period 06/12/2016, SpO2 100 %.  ECOG PERFORMANCE STATUS: 1  Physical Exam  Constitutional: Oriented to person, place, and time and well-developed, well-nourished, and in no distress.  HENT:  Head: Normocephalic and atraumatic. I did not appreciate significant swelling of the right face or eyelid. No eye tearing at this time.  Mouth/Throat: Oropharynx is clear and moist. No oropharyngeal exudate.  Eyes: Conjunctivae are normal. Right eye exhibits no discharge. Left eye exhibits no discharge. No scleral icterus.  Neck: Normal range of motion. Neck supple.  Cardiovascular: Normal rate, regular rhythm, normal heart sounds and intact distal pulses.   Pulmonary/Chest: Effort normal and breath sounds normal. No respiratory distress. No wheezes. No rales.  Abdominal: Soft. Bowel sounds are normal. Exhibits no distension and no mass. There is no tenderness.  Musculoskeletal: Normal range of motion. Exhibits no edema.  Lymphadenopathy:    No cervical adenopathy.  Neurological:  Alert and oriented to person, place, and time. Exhibits normal muscle tone. Gait normal. Coordination normal.  Skin: Skin is warm and dry. No rash noted. Not diaphoretic. No erythema. No pallor.  Psychiatric: Positive for depressed mood. Positive for memory and judgment normal.  Vitals reviewed.  LABORATORY DATA: Lab Results  Component Value Date   WBC 5.9 04/21/2022   HGB 9.9 (L) 04/21/2022   HCT 29.1 (L) 04/21/2022   MCV 100.7 (H) 04/21/2022   PLT 258 04/21/2022      Chemistry      Component Value Date/Time   NA 142 04/21/2022 1027   NA 140 04/03/2021 1059   K 3.7  04/21/2022 1027   CL 111 04/21/2022 1027   CO2 28 04/21/2022 1027   BUN 10 04/21/2022 1027   BUN 13 04/03/2021 1059   CREATININE 0.94 04/21/2022 1027   CREATININE 0.65 11/06/2013 1520      Component Value Date/Time   CALCIUM 8.9 04/21/2022 1027   ALKPHOS 67 04/21/2022 1027   AST 27 04/21/2022 1027   ALT 26 04/21/2022 1027   BILITOT 0.6 04/21/2022 1027       RADIOGRAPHIC STUDIES:  CT Chest W Contrast  Result Date: 03/30/2022 CLINICAL DATA:  Non-small-cell lung cancer metastatic disease. Adenocarcinoma of the right lung stage IV. * Tracking Code: BO * EXAM: CT CHEST, ABDOMEN, AND PELVIS WITH CONTRAST TECHNIQUE: Multidetector CT imaging of the chest, abdomen and pelvis was performed following the standard protocol during bolus administration of intravenous contrast. RADIATION DOSE REDUCTION: This exam was performed according to the departmental dose-optimization program which includes automated exposure control, adjustment of the mA and/or kV according to patient size and/or use of iterative reconstruction technique. CONTRAST:  OMNIPAQUE IOHEXOL 300 MG/ML  SOLN COMPARISON:  CT 01/25/2022 and older FINDINGS: CT CHEST FINDINGS Cardiovascular: Patient is status post median sternotomy. Trace pericardial fluid. Heart is nonenlarged. Coronary artery calcifications are seen. Scattered vascular calcifications along the thoracic aorta and noncalcified plaque. Left upper chest port. Mediastinum/Nodes: No abnormal lymph node enlargement identified in the axillary region, left hilum. Small node in the right hilum is stable. There is some mild soft tissue edema of the mediastinal fat, nonspecific. Stable tiny cystic focus in the right thyroid lobe. Slightly patulous normal caliber thoracic esophagus. Lungs/Pleura: Spiculated right upper lobe nodule again identified. Previously this has a cavitary component which is improved. Lesion previously measured 19 x 9 mm and today on series 7, image 41 measures 18  x 8 mm, not significantly changed. No other dominant lung nodule identified. Dependent atelectasis. No consolidation, pneumothorax or effusion. Mild breathing motion. Musculoskeletal: Mild degenerative changes along the spine. CT ABDOMEN PELVIS FINDINGS Hepatobiliary: Once again there are low-attenuation hepatic lesions. These include segment 7, 8 and caudate lobe segment 1. The caudate lesion previously had maximal dimension of 2.3 cm and today 2.4 x 2.1 cm, similar. These have been present since at least 2017 and again felt to be benign lesions. Gallbladder is present. Patent portal vein. Pancreas: Unremarkable. No pancreatic ductal dilatation or surrounding inflammatory changes. Spleen: Normal in size without focal abnormality. Adrenals/Urinary Tract: Left adrenal gland is preserved. Right adrenal nodule again identified. Previously 2.9 x 2.1 cm and today again similar at 2.6 by 2.2 cm. Again long-term stability. The kidneys are unremarkable without enhancing mass or collecting system dilatation. The ureters have normal course and caliber going down to the bladder. Bladder wall slightly thickened diffusely, unchanged from previous. Stomach/Bowel: On this non oral contrast exam, the large  bowel has a normal course and caliber with scattered colonic stool. Normal appendix. Small bowel is nondilated. Stomach is nondilated. There are several loops of small bowel in the midabdomen which has slight wall thickening, nonspecific. There is mesenteric stranding and slight pelvic free fluid which is increased from previous. Vascular/Lymphatic: Normal caliber aorta and IVC with mild vascular calcifications. Circumaortic left renal vein. No specific abnormal lymph node enlargement identified in the abdomen and pelvis. Reproductive: Uterus is present.  No separate adnexal mass Other: Increased pelvic free fluid.  Still small.  Anasarca. Musculoskeletal: Scattered degenerative changes of the spine and pelvis. Stable areas of  sclerosis along the lumbar spine and sacrum. There is a transitional S1 segment. IMPRESSION: Stable spiculated right upper lobe lung nodule. No developing new mass lesion, fluid collection or lymph node enlargement. Stable sclerotic bone lesions. Increasing mesenteric stranding and trace pelvic free fluid as well as some subtle areas of wall thickening along small bowel loops which are nondilated. Please correlate with any particular symptoms. Stable likely benign-appearing lesions involving the liver, right adrenal gland as previously described. Persistent wall thickening of the urinary bladder. Electronically Signed   By: Karen Kays M.D.   On: 03/30/2022 11:52   CT Abdomen Pelvis W Contrast  Result Date: 03/30/2022 CLINICAL DATA:  Non-small-cell lung cancer metastatic disease. Adenocarcinoma of the right lung stage IV. * Tracking Code: BO * EXAM: CT CHEST, ABDOMEN, AND PELVIS WITH CONTRAST TECHNIQUE: Multidetector CT imaging of the chest, abdomen and pelvis was performed following the standard protocol during bolus administration of intravenous contrast. RADIATION DOSE REDUCTION: This exam was performed according to the departmental dose-optimization program which includes automated exposure control, adjustment of the mA and/or kV according to patient size and/or use of iterative reconstruction technique. CONTRAST:  OMNIPAQUE IOHEXOL 300 MG/ML  SOLN COMPARISON:  CT 01/25/2022 and older FINDINGS: CT CHEST FINDINGS Cardiovascular: Patient is status post median sternotomy. Trace pericardial fluid. Heart is nonenlarged. Coronary artery calcifications are seen. Scattered vascular calcifications along the thoracic aorta and noncalcified plaque. Left upper chest port. Mediastinum/Nodes: No abnormal lymph node enlargement identified in the axillary region, left hilum. Small node in the right hilum is stable. There is some mild soft tissue edema of the mediastinal fat, nonspecific. Stable tiny cystic focus in  the right thyroid lobe. Slightly patulous normal caliber thoracic esophagus. Lungs/Pleura: Spiculated right upper lobe nodule again identified. Previously this has a cavitary component which is improved. Lesion previously measured 19 x 9 mm and today on series 7, image 41 measures 18 x 8 mm, not significantly changed. No other dominant lung nodule identified. Dependent atelectasis. No consolidation, pneumothorax or effusion. Mild breathing motion. Musculoskeletal: Mild degenerative changes along the spine. CT ABDOMEN PELVIS FINDINGS Hepatobiliary: Once again there are low-attenuation hepatic lesions. These include segment 7, 8 and caudate lobe segment 1. The caudate lesion previously had maximal dimension of 2.3 cm and today 2.4 x 2.1 cm, similar. These have been present since at least 2017 and again felt to be benign lesions. Gallbladder is present. Patent portal vein. Pancreas: Unremarkable. No pancreatic ductal dilatation or surrounding inflammatory changes. Spleen: Normal in size without focal abnormality. Adrenals/Urinary Tract: Left adrenal gland is preserved. Right adrenal nodule again identified. Previously 2.9 x 2.1 cm and today again similar at 2.6 by 2.2 cm. Again long-term stability. The kidneys are unremarkable without enhancing mass or collecting system dilatation. The ureters have normal course and caliber going down to the bladder. Bladder wall slightly thickened diffusely, unchanged  from previous. Stomach/Bowel: On this non oral contrast exam, the large bowel has a normal course and caliber with scattered colonic stool. Normal appendix. Small bowel is nondilated. Stomach is nondilated. There are several loops of small bowel in the midabdomen which has slight wall thickening, nonspecific. There is mesenteric stranding and slight pelvic free fluid which is increased from previous. Vascular/Lymphatic: Normal caliber aorta and IVC with mild vascular calcifications. Circumaortic left renal vein. No  specific abnormal lymph node enlargement identified in the abdomen and pelvis. Reproductive: Uterus is present.  No separate adnexal mass Other: Increased pelvic free fluid.  Still small.  Anasarca. Musculoskeletal: Scattered degenerative changes of the spine and pelvis. Stable areas of sclerosis along the lumbar spine and sacrum. There is a transitional S1 segment. IMPRESSION: Stable spiculated right upper lobe lung nodule. No developing new mass lesion, fluid collection or lymph node enlargement. Stable sclerotic bone lesions. Increasing mesenteric stranding and trace pelvic free fluid as well as some subtle areas of wall thickening along small bowel loops which are nondilated. Please correlate with any particular symptoms. Stable likely benign-appearing lesions involving the liver, right adrenal gland as previously described. Persistent wall thickening of the urinary bladder. Electronically Signed   By: Karen Kays M.D.   On: 03/30/2022 11:52     ASSESSMENT/PLAN:  This is a very pleasant 57 year old African-American female diagnosed with stage IV (T2 a, N2, M1 C) non-small cell lung cancer, adenocarcinoma.  The patient presented with a right upper lobe lung mass in addition to right hilar and mediastinal lymphadenopathy.  She also has innumerable bilateral pulmonary nodules, metastatic bone, liver, and adrenal lesions.  She was diagnosed in August 2023.  She is positive for K-ras G12 C mutation and her PD-L1 expression is 89%    The patient completed  palliative radiotherapy to the metastatic bone lesions at L4 and the sacrum under the care of Dr. Mitzi Hansen.  The last day radiation is tentatively scheduled for 10/22/21.    The patient is currently undergoing palliative systemic chemotherapy with carboplatin for an AUC of 5, Alimta 500 mg per metered square, Keytruda 200 mg IV every 3 weeks.  She is status post 11 cycles.  Starting from cycle #5 the patient started maintenance Alimta and Keytruda.  Starting  from cycle #10, due to the patient's concern with quality of life versus quantity of life, her dose of chemotherapy was slightly reduced to 400 mg/m.  I also previously had discussed with her at length about discontinuing Alimta altogether and proceeding on single agent Keytruda.   We revisited the conversation regarding her treatment. She would like to continue on the slightly dose reduced alimta 400 mg/m2 and keytruda.   Labs were reviewed. Recommend that she proceed with cycle #12 today.   She is scheduled with behavioral health later this month for her depression. Overall, she seemed to be in better spirits today. She is going to change her Zoloft to Cymbalta after she completes her current Zoloft bottle. She is scheduled to see palliative care today.   We discussed strategies to manage her reflux and nausea. Encouraged her to continue her PPI. She was advised to only a few pills at a time, then take a break, and try to take more 10-15 minutes later. We also discussed not eating too late and ensuring she sits up right at a 90 degree angle after the meals to avoid reflux. We also discussed the importance of bowel regimen while on pain medication. If needed, she can  use gas x for gas pain and continue with laxatives, stool softeners, and dietary changes to help with constipation.   She has zofran ODT since she previously was sometimes throwing up her nausea tablets.   I did not appreciate any significant facial swelling today. Her last scan mentioned some anasarca. She recently completed lasix by her PCP which helped with her other swelling but she did not notice improvement in her face. She has used warm compresses. She will get in touch with her PCP about a refill.   Explained her chemo can cause watery eyes. She was advised to use refresh tears. She is going to follow up with her eye doctor due to her increased blurry vision due to the tearing.   We will see her back for follow-up visit in 3  weeks for evaluation repeat blood work before undergoing cycle #13.   She would be interested in trying hyoscyamine for abdominal cramping.    We also previously discussed support groups and the benefits of this. She also was previously given resources for the other events/activities that are offered here.    The patient was advised to call immediately if she has any concerning symptoms in the interval. The patient voices understanding of current disease status and treatment options and is in agreement with the current care plan. All questions were answered. The patient knows to call the clinic with any problems, questions or concerns. We can certainly see the patient much sooner if necessary        No orders of the defined types were placed in this encounter.   The total time spent in the appointment was 30-39 minutes  Shalaine Payson L Yaneth Fairbairn, PA-C 04/21/22
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
## 2022-04-21 ENCOUNTER — Inpatient Hospital Stay: Payer: Medicaid Other | Attending: Internal Medicine

## 2022-04-21 ENCOUNTER — Inpatient Hospital Stay: Payer: Medicaid Other | Admitting: Physician Assistant

## 2022-04-21 ENCOUNTER — Inpatient Hospital Stay (HOSPITAL_BASED_OUTPATIENT_CLINIC_OR_DEPARTMENT_OTHER): Payer: Medicaid Other | Admitting: Nurse Practitioner

## 2022-04-21 ENCOUNTER — Other Ambulatory Visit: Payer: Self-pay

## 2022-04-21 ENCOUNTER — Other Ambulatory Visit: Payer: Self-pay | Admitting: Family Medicine

## 2022-04-21 ENCOUNTER — Other Ambulatory Visit: Payer: Self-pay | Admitting: Internal Medicine

## 2022-04-21 ENCOUNTER — Inpatient Hospital Stay (HOSPITAL_BASED_OUTPATIENT_CLINIC_OR_DEPARTMENT_OTHER): Payer: Medicaid Other

## 2022-04-21 VITALS — BP 139/83 | HR 72 | Resp 18

## 2022-04-21 VITALS — BP 134/78 | HR 75 | Temp 98.3°F | Resp 18 | Wt 201.5 lb

## 2022-04-21 DIAGNOSIS — Z880 Allergy status to penicillin: Secondary | ICD-10-CM | POA: Insufficient documentation

## 2022-04-21 DIAGNOSIS — Z5111 Encounter for antineoplastic chemotherapy: Secondary | ICD-10-CM

## 2022-04-21 DIAGNOSIS — C7951 Secondary malignant neoplasm of bone: Secondary | ICD-10-CM | POA: Diagnosis not present

## 2022-04-21 DIAGNOSIS — C7972 Secondary malignant neoplasm of left adrenal gland: Secondary | ICD-10-CM | POA: Diagnosis not present

## 2022-04-21 DIAGNOSIS — C3491 Malignant neoplasm of unspecified part of right bronchus or lung: Secondary | ICD-10-CM

## 2022-04-21 DIAGNOSIS — M7989 Other specified soft tissue disorders: Secondary | ICD-10-CM | POA: Insufficient documentation

## 2022-04-21 DIAGNOSIS — R630 Anorexia: Secondary | ICD-10-CM | POA: Diagnosis not present

## 2022-04-21 DIAGNOSIS — K5900 Constipation, unspecified: Secondary | ICD-10-CM | POA: Insufficient documentation

## 2022-04-21 DIAGNOSIS — Z515 Encounter for palliative care: Secondary | ICD-10-CM | POA: Diagnosis not present

## 2022-04-21 DIAGNOSIS — Z95828 Presence of other vascular implants and grafts: Secondary | ICD-10-CM

## 2022-04-21 DIAGNOSIS — H02849 Edema of unspecified eye, unspecified eyelid: Secondary | ICD-10-CM | POA: Insufficient documentation

## 2022-04-21 DIAGNOSIS — E119 Type 2 diabetes mellitus without complications: Secondary | ICD-10-CM | POA: Insufficient documentation

## 2022-04-21 DIAGNOSIS — G8929 Other chronic pain: Secondary | ICD-10-CM | POA: Insufficient documentation

## 2022-04-21 DIAGNOSIS — F4310 Post-traumatic stress disorder, unspecified: Secondary | ICD-10-CM | POA: Diagnosis not present

## 2022-04-21 DIAGNOSIS — Z79899 Other long term (current) drug therapy: Secondary | ICD-10-CM | POA: Insufficient documentation

## 2022-04-21 DIAGNOSIS — K219 Gastro-esophageal reflux disease without esophagitis: Secondary | ICD-10-CM | POA: Diagnosis not present

## 2022-04-21 DIAGNOSIS — C787 Secondary malignant neoplasm of liver and intrahepatic bile duct: Secondary | ICD-10-CM | POA: Insufficient documentation

## 2022-04-21 DIAGNOSIS — R600 Localized edema: Secondary | ICD-10-CM | POA: Insufficient documentation

## 2022-04-21 DIAGNOSIS — R519 Headache, unspecified: Secondary | ICD-10-CM | POA: Diagnosis not present

## 2022-04-21 DIAGNOSIS — C7971 Secondary malignant neoplasm of right adrenal gland: Secondary | ICD-10-CM | POA: Diagnosis not present

## 2022-04-21 DIAGNOSIS — Z79620 Long term (current) use of immunosuppressive biologic: Secondary | ICD-10-CM | POA: Insufficient documentation

## 2022-04-21 DIAGNOSIS — G893 Neoplasm related pain (acute) (chronic): Secondary | ICD-10-CM | POA: Diagnosis not present

## 2022-04-21 DIAGNOSIS — C3411 Malignant neoplasm of upper lobe, right bronchus or lung: Secondary | ICD-10-CM | POA: Insufficient documentation

## 2022-04-21 DIAGNOSIS — R110 Nausea: Secondary | ICD-10-CM | POA: Diagnosis not present

## 2022-04-21 DIAGNOSIS — R601 Generalized edema: Secondary | ICD-10-CM | POA: Diagnosis not present

## 2022-04-21 DIAGNOSIS — Z5112 Encounter for antineoplastic immunotherapy: Secondary | ICD-10-CM | POA: Insufficient documentation

## 2022-04-21 DIAGNOSIS — R1310 Dysphagia, unspecified: Secondary | ICD-10-CM | POA: Diagnosis not present

## 2022-04-21 DIAGNOSIS — E785 Hyperlipidemia, unspecified: Secondary | ICD-10-CM | POA: Insufficient documentation

## 2022-04-21 DIAGNOSIS — R141 Gas pain: Secondary | ICD-10-CM | POA: Insufficient documentation

## 2022-04-21 DIAGNOSIS — I10 Essential (primary) hypertension: Secondary | ICD-10-CM | POA: Insufficient documentation

## 2022-04-21 DIAGNOSIS — Z79631 Long term (current) use of antimetabolite agent: Secondary | ICD-10-CM | POA: Insufficient documentation

## 2022-04-21 DIAGNOSIS — K5903 Drug induced constipation: Secondary | ICD-10-CM

## 2022-04-21 DIAGNOSIS — Z923 Personal history of irradiation: Secondary | ICD-10-CM | POA: Insufficient documentation

## 2022-04-21 DIAGNOSIS — M549 Dorsalgia, unspecified: Secondary | ICD-10-CM | POA: Insufficient documentation

## 2022-04-21 DIAGNOSIS — R112 Nausea with vomiting, unspecified: Secondary | ICD-10-CM | POA: Insufficient documentation

## 2022-04-21 DIAGNOSIS — A6000 Herpesviral infection of urogenital system, unspecified: Secondary | ICD-10-CM

## 2022-04-21 DIAGNOSIS — Z8774 Personal history of (corrected) congenital malformations of heart and circulatory system: Secondary | ICD-10-CM | POA: Insufficient documentation

## 2022-04-21 LAB — CMP (CANCER CENTER ONLY)
ALT: 26 U/L (ref 0–44)
AST: 27 U/L (ref 15–41)
Albumin: 3.6 g/dL (ref 3.5–5.0)
Alkaline Phosphatase: 67 U/L (ref 38–126)
Anion gap: 3 — ABNORMAL LOW (ref 5–15)
BUN: 10 mg/dL (ref 6–20)
CO2: 28 mmol/L (ref 22–32)
Calcium: 8.9 mg/dL (ref 8.9–10.3)
Chloride: 111 mmol/L (ref 98–111)
Creatinine: 0.94 mg/dL (ref 0.44–1.00)
GFR, Estimated: >60 mL/min (ref >60)
Glucose, Bld: 130 mg/dL — ABNORMAL HIGH (ref 70–99)
Potassium: 3.7 mmol/L (ref 3.5–5.1)
Sodium: 142 mmol/L (ref 135–145)
Total Bilirubin: 0.6 mg/dL (ref 0.3–1.2)
Total Protein: 6.6 g/dL (ref 6.5–8.1)

## 2022-04-21 LAB — CBC WITH DIFFERENTIAL (CANCER CENTER ONLY)
Abs Immature Granulocytes: 0.02 10*3/uL (ref 0.00–0.07)
Basophils Absolute: 0 10*3/uL (ref 0.0–0.1)
Basophils Relative: 0 %
Eosinophils Absolute: 0 10*3/uL (ref 0.0–0.5)
Eosinophils Relative: 1 %
HCT: 29.1 % — ABNORMAL LOW (ref 36.0–46.0)
Hemoglobin: 9.9 g/dL — ABNORMAL LOW (ref 12.0–15.0)
Immature Granulocytes: 0 %
Lymphocytes Relative: 11 %
Lymphs Abs: 0.7 10*3/uL (ref 0.7–4.0)
MCH: 34.3 pg — ABNORMAL HIGH (ref 26.0–34.0)
MCHC: 34 g/dL (ref 30.0–36.0)
MCV: 100.7 fL — ABNORMAL HIGH (ref 80.0–100.0)
Monocytes Absolute: 0.7 10*3/uL (ref 0.1–1.0)
Monocytes Relative: 11 %
Neutro Abs: 4.5 10*3/uL (ref 1.7–7.7)
Neutrophils Relative %: 77 %
Platelet Count: 258 10*3/uL (ref 150–400)
RBC: 2.89 MIL/uL — ABNORMAL LOW (ref 3.87–5.11)
RDW: 14.7 % (ref 11.5–15.5)
WBC Count: 5.9 10*3/uL (ref 4.0–10.5)
nRBC: 0 % (ref 0.0–0.2)

## 2022-04-21 LAB — TSH: TSH: 3.579 u[IU]/mL (ref 0.350–4.500)

## 2022-04-21 MED ORDER — SODIUM CHLORIDE 0.9 % IV SOLN
200.0000 mg | Freq: Once | INTRAVENOUS | Status: AC
  Administered 2022-04-21: 200 mg via INTRAVENOUS
  Filled 2022-04-21: qty 200

## 2022-04-21 MED ORDER — SODIUM CHLORIDE 0.9% FLUSH
10.0000 mL | Freq: Once | INTRAVENOUS | Status: AC
  Administered 2022-04-21: 10 mL

## 2022-04-21 MED ORDER — CYANOCOBALAMIN 1000 MCG/ML IJ SOLN
1000.0000 ug | Freq: Once | INTRAMUSCULAR | Status: AC
  Administered 2022-04-21: 1000 ug via INTRAMUSCULAR
  Filled 2022-04-21: qty 1

## 2022-04-21 MED ORDER — SODIUM CHLORIDE 0.9 % IV SOLN: Freq: Once | INTRAVENOUS | Status: AC

## 2022-04-21 MED ORDER — SODIUM CHLORIDE 0.9% FLUSH
10.0000 mL | INTRAVENOUS | Status: DC | PRN
  Administered 2022-04-21: 10 mL

## 2022-04-21 MED ORDER — HEPARIN SOD (PORK) LOCK FLUSH 100 UNIT/ML IV SOLN
500.0000 [IU] | Freq: Once | INTRAVENOUS | Status: AC | PRN
  Administered 2022-04-21: 500 [IU]

## 2022-04-21 MED ORDER — SODIUM CHLORIDE 0.9 % IV SOLN
400.0000 mg/m2 | Freq: Once | INTRAVENOUS | Status: AC
  Administered 2022-04-21: 800 mg via INTRAVENOUS
  Filled 2022-04-21: qty 20

## 2022-04-21 MED ORDER — PROCHLORPERAZINE MALEATE 10 MG PO TABS
10.0000 mg | ORAL_TABLET | Freq: Once | ORAL | Status: AC
  Administered 2022-04-21: 10 mg via ORAL
  Filled 2022-04-21: qty 1

## 2022-04-21 NOTE — Progress Notes (Signed)
Patient seen by Cassie Heilingoetter, PA-C  Vitals are within treatment parameters.  Labs reviewed: and are within treatment parameters.  Per physician team, patient is ready for treatment and there are NO modifications to the treatment plan.  

## 2022-04-21 NOTE — Patient Instructions (Signed)
[facility]  Discharge Instructions: Thank you for choosing [facility] to provide your oncology and hematology care.   If you have a lab appointment with the Cancer Center, please go directly to the Cancer Center and check in at the registration area.   Wear comfortable clothing and clothing appropriate for easy access to any Portacath or PICC line.   We strive to give you quality time with your provider. You may need to reschedule your appointment if you arrive late (15 or more minutes).  Arriving late affects you and other patients whose appointments are after yours.  Also, if you miss three or more appointments without notifying the office, you may be dismissed from the clinic at the provider's discretion.      For prescription refill requests, have your pharmacy contact our office and allow 72 hours for refills to be completed.    Today you received the following chemotherapy and/or immunotherapy agents: Keytruda and Alimta      To help prevent nausea and vomiting after your treatment, we encourage you to take your nausea medication as directed.  BELOW ARE SYMPTOMS THAT SHOULD BE REPORTED IMMEDIATELY: *FEVER GREATER THAN 100.4 F (38 C) OR HIGHER *CHILLS OR SWEATING *NAUSEA AND VOMITING THAT IS NOT CONTROLLED WITH YOUR NAUSEA MEDICATION *UNUSUAL SHORTNESS OF BREATH *UNUSUAL BRUISING OR BLEEDING *URINARY PROBLEMS (pain or burning when urinating, or frequent urination) *BOWEL PROBLEMS (unusual diarrhea, constipation, pain near the anus) TENDERNESS IN MOUTH AND THROAT WITH OR WITHOUT PRESENCE OF ULCERS (sore throat, sores in mouth, or a toothache) UNUSUAL RASH, SWELLING OR PAIN  UNUSUAL VAGINAL DISCHARGE OR ITCHING   Items with * indicate a potential emergency and should be followed up as soon as possible or go to the Emergency Department if any problems should occur.  Please show the CHEMOTHERAPY ALERT CARD or IMMUNOTHERAPY ALERT  CARD at check-in to the Emergency Department and triage nurse.  Should you have questions after your visit or need to cancel or reschedule your appointment, please contact [facility]  Dept: [phone]  and follow the prompts.  Office hours are 8:00 a.m. to 4:30 p.m. Monday - Friday. Please note that voicemails left after 4:00 p.m. may not be returned until the following business day.  We are closed weekends and major holidays. You have access to a nurse at all times for urgent questions. Please call the main number to the clinic Dept: [phone] and follow the prompts.   For any non-urgent questions, you may also contact your provider using MyChart. We now offer e-Visits for anyone 18 and older to request care online for non-urgent symptoms. For details visit [email or web address].   Also download the MyChart app! Go to the app store, search "MyChart", open the app, select [facility], and log in with your MyChart username and password.   

## 2022-04-23 LAB — T4: T4, Total: 9.5 ug/dL (ref 4.5–12.0)

## 2022-04-26 ENCOUNTER — Other Ambulatory Visit: Payer: Self-pay | Admitting: Family Medicine

## 2022-04-26 MED ORDER — BASAGLAR KWIKPEN 100 UNIT/ML ~~LOC~~ SOPN: 24.0000 [IU] | PEN_INJECTOR | Freq: Every morning | SUBCUTANEOUS | 11 refills | Status: DC

## 2022-04-26 NOTE — Progress Notes (Signed)
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   Called patient, she has been doing 15U BID. Still having significant nausea.  Hasn't had any low sugars.  Discussed need for switch. Will switch to [employer]. Will reduce by 20%, so she can take 24U daily. Can increase if needed. Last A1c was at goal, not due again until July.

## 2022-04-27 ENCOUNTER — Other Ambulatory Visit: Payer: Self-pay | Admitting: Nurse Practitioner

## 2022-04-27 MED ORDER — METOCLOPRAMIDE HCL 10 MG PO TABS: 10.0000 mg | ORAL_TABLET | Freq: Three times a day (TID) | ORAL | 0 refills | Status: DC

## 2022-04-29 ENCOUNTER — Telehealth: Payer: Self-pay | Admitting: Internal Medicine

## 2022-04-29 NOTE — Telephone Encounter (Signed)
Called patient regarding upcoming May and June appointments, patient is notified. ?

## 2022-05-03 ENCOUNTER — Other Ambulatory Visit (HOSPITAL_COMMUNITY): Payer: Self-pay

## 2022-05-03 ENCOUNTER — Telehealth: Payer: Self-pay

## 2022-05-03 ENCOUNTER — Other Ambulatory Visit: Payer: Self-pay | Admitting: Family Medicine

## 2022-05-03 MED ORDER — INSULIN GLARGINE 100 UNIT/ML SOLOSTAR PEN
24.0000 [IU] | PEN_INJECTOR | SUBCUTANEOUS | 11 refills | Status: DC
Start: 2022-05-03 — End: 2023-09-30

## 2022-05-03 NOTE — Telephone Encounter (Signed)
Rec'd Pa request for patients basaglar pens.   Medicaid prefers Lantus vial/ solostar or insulin glargine via/ solostar.  Could this be sent in?

## 2022-05-11 NOTE — Progress Notes (Signed)
Palliative Medicine [facility]  [phone] [phone]) [phone]   Name: Cassidy [name] Date: 05/11/2022 MRN: [id number]  DOB: [birth year]  Patient Care Team: [name], DO as PCP - General (Family Medicine) O'Neal, Ronnald Ramp, MD as PCP - Cardiology (Cardiology) Emelia Loron, MD as Consulting Physician (General Surgery)    INTERVAL HISTORY: Cassidy [name] is a 57 y.o. 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  Palliative ask to see for symptom management and goals of care.   SOCIAL HISTORY:    Cassidy [name] reports that she has been smoking cigarettes. She started smoking about 43 years ago. She has a 21.00 pack-year smoking history. She has never used smokeless tobacco. She reports that she does not currently use drugs. She reports that she does not drink alcohol.  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  s/p PCTA to dRCA and ostial RPAV-PLA vessel + PDA branch (05/2012)  b. [place] s/p DES to mLAD - Resolute DES 3.0 x 22 (3.45mm -->3.3 mm)   Bipolar depression (HCC)    Breast abscess    a. right side.    COPD (chronic obstructive pulmonary disease) (HCC)    Depression    Diabetic peripheral neuropathy (HCC)    Fibromyalgia    Genital warts    GERD (gastroesophageal reflux disease)    History of hiatal hernia    HLD (hyperlipidemia)    Hypertension    Pain in limb    a. LE VENOUS DUPLEX, 02/05/2009 - no evidence of deep vein thrombosis, Baker's cyst   Pneumonia    PTSD (post-traumatic  stress disorder)    Sickle cell trait (HCC)    Tobacco abuse    Tooth caries    Type II diabetes mellitus (HCC)     ALLERGIES:  is allergic to amoxil [amoxicillin], chantix [varenicline], suboxone [buprenorphine hcl-naloxone hcl], and emend [fosaprepitant dimeglumine].  MEDICATIONS:  Current Outpatient Medications  Medication Sig Dispense Refill   aspirin EC 81 MG tablet Take 81 mg by mouth daily.     atorvastatin (LIPITOR) 40 MG tablet TAKE 1 TABLET (40 MG TOTAL) BY MOUTH DAILY. 90 tablet 3   busPIRone (BUSPAR) 15 MG tablet TAKE 1/2 TABLET (7.5 MG TOTAL) BY MOUTH TWICE DAILY (Patient taking differently: Take 7.5 mg by mouth 2 (two) times daily.) 30 tablet 11   Carboxymethylcellul-Glycerin (LUBRICATING EYE DROPS OP) Place 1 drop into both eyes daily as needed (dry eyes).     carvedilol (COREG) 12.5 MG tablet TAKE 1 TABLET (12.5 MG TOTAL) BY MOUTH 2 (TWO) TIMES DAILY WITH A MEAL. 180 tablet 1   diclofenac Sodium (VOLTAREN) 1 % GEL Apply 1 application topically 3 times daily as needed for pain 2 g 3   ferrous sulfate 325 (65 FE) MG tablet Take 325 mg by mouth daily with breakfast.     fluticasone (FLONASE) 50 MCG/ACT nasal spray PLACE 1 SPRAY INTO BOTH NOSTRILS DAILY AS NEEDED (CONGESTION). 16 g 1   fluticasone-salmeterol (ADVAIR) 100-50 MCG/ACT AEPB Inhale 1 puff into the lungs 2 (two) times daily. 1 each  3   folic acid (FOLVITE) 1 MG tablet Take 1 tablet (1 mg total) by mouth daily. 30 tablet 3   furosemide (LASIX) 40 MG tablet Take 1 tablet (40 mg total) by mouth daily as needed. If you gain 3 lbs in 24 hours or 5 lbs in 1 week 30 tablet 1   glucose blood (ACCU-CHEK AVIVA PLUS) test strip Use as instructed 50 each 1   hyoscyamine (LEVSIN SL) 0.125 MG SL tablet Place 1 tablet (0.125 mg total) under the tongue every 6 (six) hours as needed. For abdominal cramping 30 tablet 0   insulin glargine (LANTUS) 100 UNIT/ML Solostar Pen Inject 24 Units into the skin every morning. 3 mL 11   Insulin  Pen Needle 32G X 4 MM MISC 1 Needle by Does not apply route in the morning and at bedtime. 120 each 2   ipratropium (ATROVENT HFA) 17 MCG/ACT inhaler Inhale 2 puffs into the lungs every 4 (four) hours as needed for wheezing (COPD).     lactulose (CHRONULAC) 10 GM/15ML solution Take 30 mLs (20 g total) by mouth 2 (two) times daily as needed for mild constipation, moderate constipation or severe constipation. 236 mL 0   lidocaine-prilocaine (EMLA) cream APPLY 1 APPLICATION TOPICALLY AS NEEDED. 30 g 2   LORazepam (ATIVAN) 0.5 MG tablet Take 1 tablet (0.5 mg total) by mouth every 8 (eight) hours as needed for anxiety (nausea). 30 tablet 0   metoCLOPramide (REGLAN) 10 MG tablet Take 1 tablet (10 mg total) by mouth 3 (three) times daily before meals. 30 tablet 0   nicotine polacrilex (NICORETTE) 2 MG gum Take 1 each (2 mg total) by mouth as needed for smoking cessation. 100 tablet 0   nitroGLYCERIN (NITROSTAT) 0.4 MG SL tablet Place 1 tablet (0.4 mg total) under the tongue every 5 (five) minutes as needed for chest pain. 25 tablet 5   nystatin (MYCOSTATIN) 100000 UNIT/ML suspension Take 5 mLs (500,000 Units total) by mouth 4 (four) times daily. 60 mL 0   ondansetron (ZOFRAN-ODT) 8 MG disintegrating tablet Take 1 tablet (8 mg total) by mouth every 8 (eight) hours as needed for nausea or vomiting. (Patient not taking: Reported on 02/02/2022) 30 tablet 2   oxyCODONE (OXY IR/ROXICODONE) 5 MG immediate release tablet Take 1-2 tablets (5-10 mg total) by mouth every 4 (four) hours as needed for severe pain. 90 tablet 0   oxyCODONE ER (XTAMPZA ER) 13.5 MG C12A Take 13.5 mg by mouth every 12 (twelve) hours. 60 capsule 0   pantoprazole (PROTONIX) 40 MG tablet TAKE 1 TABLET (40 MG TOTAL) BY MOUTH DAILY. 30 tablet 1   polyethylene glycol powder (GAVILAX) 17 GM/SCOOP powder TAKE ONE CAPFUL (17 GRAMS )BY MOUTH DAILY AS NEEDED FOR MILD OR MODERATE CONSTIPATION. 510 g 1   potassium chloride SA (KLOR-CON M) 20 MEQ tablet Take  1 tablet (20 mEq total) by mouth daily as needed. On days you take Lasix 30 tablet 1   prochlorperazine (COMPAZINE) 10 MG tablet Take 1 tablet (10 mg total) by mouth every 6 (six) hours as needed for nausea or vomiting. 30 tablet 0   sertraline (ZOLOFT) 50 MG tablet TAKE 1 TABLET (50 MG TOTAL) BY MOUTH DAILY. 30 tablet 3   valACYclovir (VALTREX) 1000 MG tablet TAKE FOR 3 DAYS AS NEEDED FOR EACH OUTBREAK 30 tablet 11   vitamin B-12 (CYANOCOBALAMIN) 1000 MCG tablet Take 1,000 mcg by mouth daily.     No current facility-administered medications for this visit.  VITAL SIGNS: LMP 06/12/2016 (Approximate)  There were no vitals filed for this visit.  Estimated body mass index is 31.09 kg/m as calculated from the following:   Height as of 04/07/22: 5' 7.5" (1.715 m).   Weight as of 04/21/22: 201 lb 8 oz (91.4 kg).   PERFORMANCE STATUS (ECOG) : 1 - Symptomatic but completely ambulatory   Physical Exam: General: NAD HEENT: mild right facial edema  Pulmonary: normal breathing pattern  Extremities: trace pedal edema, no joint deformities Skin: no rashes Neurological: alert and oriented x 4, mood and affect appropriate   IMPRESSION: .   I saw Ms. Goodness during infusion. Resting comfortably. Denies nausea, vomiting, or diarrhea. States she is trying to remain as active as possible. Shares she is preparing to attend her niece's wedding and looking forward to this.   Neoplasm related pain Ms. Piscitelli reports that her pain is well controlled on current regimen. She is taking Xtampza 13 mg every 12 hours and Oxycodone 5-10 mg every 4 hours as needed for breakthrough pain. Does not require around the clock.   Although patient had quit taking Gabapentin, it was officially discontinued by Neurology. Plan in place, per Neurology and PCP, to transition patient from Zoloft to Cymbalta. It is felt that this will serve to help both patient's depression and pain.  Pain and stiffness continue to limit Ms.  Hissong's mobility. She reports that some mornings it is difficult to even put on her shoes. She expresses appreciation for the walker that she received. Is also using topical Voltaren to hip area for aches during the evening and night time hours.    2.  Nausea Controlled on regimen.   3. Constipation  Elenna continues to struggle with constipation. She is taking miralax, prune juice, and increasing fiber intake. Shares over the past week constipation has been the worst causing her some feelings of dizziness and weakness after sitting on toilet for extended time.   Education provided on ways to cope and risk of vagal episodes. She did try Fleets enema several days ago with some relief. Also noted some rectal discomfort afterwards. Has began lactulose and feels she has noticed some improvement.   She did not previously tolerate Linzess however is open to re-attempting. Previous experience resulting in loose stools and abdominal cramping. We discussed taking maybe every other day to see how she tolerated.   We will continue to closely monitor symptoms and adjust medications accordingly. No changes today.   We discussed the importance of continued conversation with family and their medical providers regarding overall plan of care and treatment options, ensuring decisions are within the context of the patients values and GOCs.  PLAN: Continue Xtampza 13 mg every 12 hours. Oxycodone IR 5-10 mg every 6 hours as needed for breakthrough pain. Senna-S and Miralax on a daily basis for the management of constipation. Zofran, Compazine, and Ativan as needed for nausea Restart Linzess as needed  Ongoing emotional support and symptom management. Support follow-up with PCP and Ophthalmology regarding right facial swelling and visual changes. Palliative will plan to see patient back in 2-4 weeks in collaboration with her oncology appointments.   Patient expressed understanding and was in agreement with  this plan. She also understands that She can call the clinic at any time with any questions, concerns, or complaints.    Any controlled substances utilized were prescribed in the context of palliative care. PDMP has been reviewed.   Any controlled substances utilized were prescribed in the context  of palliative care. PDMP has been reviewed.    Visit consisted of counseling and education dealing with the complex and emotionally intense issues of symptom management and palliative care in the setting of serious and potentially life-threatening illness.Greater than 50%  of this time was spent counseling and coordinating care related to the above assessment and plan.  Willette Alma, AGPCNP-BC  Palliative Medicine Team/[facility]  *Please note that this is a verbal dictation therefore any spelling or grammatical errors are due to the "[employer] One" system interpretation.

## 2022-05-12 ENCOUNTER — Encounter: Payer: Self-pay | Admitting: Medical Oncology

## 2022-05-12 ENCOUNTER — Inpatient Hospital Stay (HOSPITAL_BASED_OUTPATIENT_CLINIC_OR_DEPARTMENT_OTHER): Payer: Medicaid Other | Admitting: Nurse Practitioner

## 2022-05-12 ENCOUNTER — Ambulatory Visit: Payer: Medicaid Other | Admitting: Internal Medicine

## 2022-05-12 ENCOUNTER — Inpatient Hospital Stay (HOSPITAL_BASED_OUTPATIENT_CLINIC_OR_DEPARTMENT_OTHER): Payer: Medicaid Other | Admitting: Internal Medicine

## 2022-05-12 ENCOUNTER — Other Ambulatory Visit: Payer: Self-pay

## 2022-05-12 ENCOUNTER — Inpatient Hospital Stay: Payer: Medicaid Other | Attending: Internal Medicine

## 2022-05-12 ENCOUNTER — Other Ambulatory Visit: Payer: Self-pay | Admitting: Cardiovascular Disease

## 2022-05-12 ENCOUNTER — Ambulatory Visit: Payer: Medicaid Other

## 2022-05-12 ENCOUNTER — Inpatient Hospital Stay: Payer: Medicaid Other

## 2022-05-12 ENCOUNTER — Other Ambulatory Visit: Payer: Medicaid Other

## 2022-05-12 ENCOUNTER — Encounter: Payer: Self-pay | Admitting: Nurse Practitioner

## 2022-05-12 VITALS — BP 107/70 | HR 69 | Resp 16

## 2022-05-12 VITALS — BP 118/65 | HR 66 | Temp 97.5°F | Resp 13 | Wt 196.0 lb

## 2022-05-12 DIAGNOSIS — Z515 Encounter for palliative care: Secondary | ICD-10-CM

## 2022-05-12 DIAGNOSIS — Z8774 Personal history of (corrected) congenital malformations of heart and circulatory system: Secondary | ICD-10-CM | POA: Insufficient documentation

## 2022-05-12 DIAGNOSIS — Z5111 Encounter for antineoplastic chemotherapy: Secondary | ICD-10-CM | POA: Diagnosis present

## 2022-05-12 DIAGNOSIS — E041 Nontoxic single thyroid nodule: Secondary | ICD-10-CM | POA: Diagnosis not present

## 2022-05-12 DIAGNOSIS — Z79899 Other long term (current) drug therapy: Secondary | ICD-10-CM | POA: Diagnosis not present

## 2022-05-12 DIAGNOSIS — R197 Diarrhea, unspecified: Secondary | ICD-10-CM | POA: Diagnosis not present

## 2022-05-12 DIAGNOSIS — C3411 Malignant neoplasm of upper lobe, right bronchus or lung: Secondary | ICD-10-CM | POA: Diagnosis present

## 2022-05-12 DIAGNOSIS — G893 Neoplasm related pain (acute) (chronic): Secondary | ICD-10-CM | POA: Diagnosis not present

## 2022-05-12 DIAGNOSIS — C7951 Secondary malignant neoplasm of bone: Secondary | ICD-10-CM | POA: Diagnosis not present

## 2022-05-12 DIAGNOSIS — R530 Neoplastic (malignant) related fatigue: Secondary | ICD-10-CM | POA: Diagnosis not present

## 2022-05-12 DIAGNOSIS — I2510 Atherosclerotic heart disease of native coronary artery without angina pectoris: Secondary | ICD-10-CM | POA: Diagnosis not present

## 2022-05-12 DIAGNOSIS — C787 Secondary malignant neoplasm of liver and intrahepatic bile duct: Secondary | ICD-10-CM | POA: Diagnosis not present

## 2022-05-12 DIAGNOSIS — I10 Essential (primary) hypertension: Secondary | ICD-10-CM | POA: Insufficient documentation

## 2022-05-12 DIAGNOSIS — C3491 Malignant neoplasm of unspecified part of right bronchus or lung: Secondary | ICD-10-CM

## 2022-05-12 DIAGNOSIS — Z79620 Long term (current) use of immunosuppressive biologic: Secondary | ICD-10-CM | POA: Insufficient documentation

## 2022-05-12 DIAGNOSIS — C3490 Malignant neoplasm of unspecified part of unspecified bronchus or lung: Secondary | ICD-10-CM | POA: Diagnosis not present

## 2022-05-12 DIAGNOSIS — Z5112 Encounter for antineoplastic immunotherapy: Secondary | ICD-10-CM | POA: Diagnosis present

## 2022-05-12 DIAGNOSIS — Z880 Allergy status to penicillin: Secondary | ICD-10-CM | POA: Insufficient documentation

## 2022-05-12 DIAGNOSIS — K5900 Constipation, unspecified: Secondary | ICD-10-CM | POA: Diagnosis not present

## 2022-05-12 DIAGNOSIS — R188 Other ascites: Secondary | ICD-10-CM | POA: Diagnosis not present

## 2022-05-12 DIAGNOSIS — K5903 Drug induced constipation: Secondary | ICD-10-CM

## 2022-05-12 DIAGNOSIS — E119 Type 2 diabetes mellitus without complications: Secondary | ICD-10-CM | POA: Diagnosis not present

## 2022-05-12 DIAGNOSIS — I3139 Other pericardial effusion (noninflammatory): Secondary | ICD-10-CM | POA: Insufficient documentation

## 2022-05-12 DIAGNOSIS — Z79631 Long term (current) use of antimetabolite agent: Secondary | ICD-10-CM | POA: Insufficient documentation

## 2022-05-12 DIAGNOSIS — F17210 Nicotine dependence, cigarettes, uncomplicated: Secondary | ICD-10-CM | POA: Diagnosis not present

## 2022-05-12 DIAGNOSIS — R110 Nausea: Secondary | ICD-10-CM | POA: Insufficient documentation

## 2022-05-12 DIAGNOSIS — Z95828 Presence of other vascular implants and grafts: Secondary | ICD-10-CM

## 2022-05-12 LAB — CMP (CANCER CENTER ONLY)
ALT: 24 U/L (ref 0–44)
AST: 27 U/L (ref 15–41)
Albumin: 3.7 g/dL (ref 3.5–5.0)
Alkaline Phosphatase: 64 U/L (ref 38–126)
Anion gap: 3 — ABNORMAL LOW (ref 5–15)
BUN: 10 mg/dL (ref 6–20)
CO2: 29 mmol/L (ref 22–32)
Calcium: 8.7 mg/dL — ABNORMAL LOW (ref 8.9–10.3)
Chloride: 108 mmol/L (ref 98–111)
Creatinine: 0.87 mg/dL (ref 0.44–1.00)
GFR, Estimated: >60 mL/min (ref >60)
Glucose, Bld: 138 mg/dL — ABNORMAL HIGH (ref 70–99)
Potassium: 3.9 mmol/L (ref 3.5–5.1)
Sodium: 140 mmol/L (ref 135–145)
Total Bilirubin: 0.5 mg/dL (ref 0.3–1.2)
Total Protein: 6.7 g/dL (ref 6.5–8.1)

## 2022-05-12 LAB — CBC WITH DIFFERENTIAL (CANCER CENTER ONLY)
Abs Immature Granulocytes: 0.03 10*3/uL (ref 0.00–0.07)
Basophils Absolute: 0 10*3/uL (ref 0.0–0.1)
Basophils Relative: 0 %
Eosinophils Absolute: 0.1 10*3/uL (ref 0.0–0.5)
Eosinophils Relative: 1 %
HCT: 29.7 % — ABNORMAL LOW (ref 36.0–46.0)
Hemoglobin: 9.9 g/dL — ABNORMAL LOW (ref 12.0–15.0)
Immature Granulocytes: 1 %
Lymphocytes Relative: 17 %
Lymphs Abs: 0.8 10*3/uL (ref 0.7–4.0)
MCH: 34.1 pg — ABNORMAL HIGH (ref 26.0–34.0)
MCHC: 33.3 g/dL (ref 30.0–36.0)
MCV: 102.4 fL — ABNORMAL HIGH (ref 80.0–100.0)
Monocytes Absolute: 0.6 10*3/uL (ref 0.1–1.0)
Monocytes Relative: 12 %
Neutro Abs: 3.3 10*3/uL (ref 1.7–7.7)
Neutrophils Relative %: 69 %
Platelet Count: 276 10*3/uL (ref 150–400)
RBC: 2.9 MIL/uL — ABNORMAL LOW (ref 3.87–5.11)
RDW: 14.7 % (ref 11.5–15.5)
WBC Count: 4.7 10*3/uL (ref 4.0–10.5)
nRBC: 0 % (ref 0.0–0.2)

## 2022-05-12 MED ORDER — SODIUM CHLORIDE 0.9 % IV SOLN
200.0000 mg | Freq: Once | INTRAVENOUS | Status: AC
  Administered 2022-05-12: 200 mg via INTRAVENOUS
  Filled 2022-05-12: qty 200

## 2022-05-12 MED ORDER — XTAMPZA ER 13.5 MG PO C12A
13.5000 mg | EXTENDED_RELEASE_CAPSULE | Freq: Two times a day (BID) | ORAL | 0 refills | Status: DC
Start: 2022-05-12 — End: 2022-06-01

## 2022-05-12 MED ORDER — SODIUM CHLORIDE 0.9 % IV SOLN
400.0000 mg/m2 | Freq: Once | INTRAVENOUS | Status: AC
  Administered 2022-05-12: 800 mg via INTRAVENOUS
  Filled 2022-05-12: qty 20

## 2022-05-12 MED ORDER — SODIUM CHLORIDE 0.9 % IV SOLN: Freq: Once | INTRAVENOUS | Status: AC

## 2022-05-12 MED ORDER — SODIUM CHLORIDE 0.9% FLUSH
10.0000 mL | Freq: Once | INTRAVENOUS | Status: AC
  Administered 2022-05-12: 10 mL

## 2022-05-12 MED ORDER — HEPARIN SOD (PORK) LOCK FLUSH 100 UNIT/ML IV SOLN
500.0000 [IU] | Freq: Once | INTRAVENOUS | Status: AC | PRN
  Administered 2022-05-12: 500 [IU]

## 2022-05-12 MED ORDER — SODIUM CHLORIDE 0.9% FLUSH
10.0000 mL | INTRAVENOUS | Status: DC | PRN
  Administered 2022-05-12: 10 mL

## 2022-05-12 MED ORDER — PROCHLORPERAZINE MALEATE 10 MG PO TABS
10.0000 mg | ORAL_TABLET | Freq: Once | ORAL | Status: AC
  Administered 2022-05-12: 10 mg via ORAL
  Filled 2022-05-12: qty 1

## 2022-05-12 MED ORDER — METOCLOPRAMIDE HCL 10 MG PO TABS
10.0000 mg | ORAL_TABLET | Freq: Three times a day (TID) | ORAL | 0 refills | Status: DC
Start: 2022-05-12 — End: 2022-10-01

## 2022-05-12 MED ORDER — OXYCODONE HCL 5 MG PO TABS
5.0000 mg | ORAL_TABLET | ORAL | 0 refills | Status: DC | PRN
Start: 2022-05-12 — End: 2022-06-01

## 2022-05-12 NOTE — Progress Notes (Signed)
[facility] [phone]   [phone]  OFFICE PROGRESS NOTE  [name], DO [address] [place] [address]  DIAGNOSIS: stage IV (T2a, N2, M1c) non-small cell lung cancer, adenocarcinoma presented with right upper lobe lung mass in addition to right hilar and mediastinal lymphadenopathy and innumerable bilateral pulmonary nodules as well as liver and bone metastasis diagnosed in August 2023.   Detected Alteration(s) / Biomarker(s) Associated FDA-approved therapies Clinical Trial Availability % cfDNA or Amplification  KRAS G12C  approved by FDA Adagrasib, Sotorasib Yes 5.3%  IDH1 R132L  approved in other indication Ivosidenib, Olutasidenib Yes 1.9%  PD-L1 expression 89%  PRIOR THERAPY: None  CURRENT THERAPY: Systemic chemotherapy with carboplatin for AUC of 5, Alimta 500 Mg/M2 and Keytruda 200 Mg IV every 3 weeks.  First dose September 02, 2021.  Status post 12 cycles.  Starting from cycle #5 she is on maintenance treatment with Alimta and Keytruda every 3 weeks.  INTERVAL HISTORY: Cassidy [name] 57 y.o. female returns to clinic today for follow-up visit.  The patient is feeling fine today with no concerning complaints except for the persistent constipation and she has an appointment with [facility] gastroenterology on June 12 for evaluation.  She had tried Linzess in the past but she has a lot of complication and profound diarrhea with it.  She denied having any current chest pain, shortness of breath, cough or hemoptysis.  She has no nausea, vomiting, abdominal pain or diarrhea.  She has no headache or visual changes.  She lost few pounds recently after changing her diet and eating more foods.  She is here today for evaluation before starting cycle #13 of her treatment.  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  s/p PCTA to dRCA and ostial RPAV-PLA vessel + PDA branch (05/2012)  b. [place] s/p DES to mLAD - Resolute DES 3.0 x 22 (3.34mm -->3.3 mm)   Bipolar depression (HCC)    Breast abscess    a. right side.    COPD (chronic obstructive pulmonary disease) (HCC)    Depression    Diabetic peripheral neuropathy (HCC)    Fibromyalgia    Genital warts    GERD (gastroesophageal reflux disease)    History of hiatal hernia    HLD (hyperlipidemia)    Hypertension    Pain in limb    a. LE VENOUS DUPLEX, 02/05/2009 - no evidence of deep vein thrombosis, Baker's cyst   Pneumonia    PTSD (post-traumatic stress disorder)    Sickle cell trait (HCC)    Tobacco abuse    Tooth caries    Type II diabetes mellitus (HCC)     ALLERGIES:  is allergic to amoxil [amoxicillin], chantix [varenicline], suboxone [buprenorphine hcl-naloxone hcl], and emend [fosaprepitant dimeglumine].  MEDICATIONS:  Current Outpatient Medications  Medication Sig Dispense Refill   aspirin EC 81 MG tablet Take 81 mg by mouth daily.     atorvastatin (LIPITOR) 40 MG tablet TAKE 1 TABLET (40 MG TOTAL) BY MOUTH DAILY. 90 tablet 3   busPIRone (BUSPAR) 15 MG tablet TAKE 1/2 TABLET (7.5 MG TOTAL) BY MOUTH TWICE DAILY (Patient taking differently: Take 7.5 mg by mouth 2 (two) times daily.) 30 tablet 11   Carboxymethylcellul-Glycerin (LUBRICATING EYE  DROPS OP) Place 1 drop into both eyes daily as needed (dry eyes).     carvedilol (COREG) 12.5 MG tablet TAKE 1 TABLET (12.5 MG TOTAL) BY MOUTH 2 (TWO) TIMES DAILY WITH A MEAL. 180 tablet 1   diclofenac Sodium (VOLTAREN) 1 % GEL Apply 1 application topically 3 times daily as needed for pain 2 g 3   ferrous sulfate 325 (65 FE) MG tablet Take 325 mg by mouth daily with breakfast.     fluticasone (FLONASE) 50 MCG/ACT nasal spray PLACE 1 SPRAY INTO BOTH NOSTRILS DAILY AS NEEDED (CONGESTION). 16 g 1   fluticasone-salmeterol  (ADVAIR) 100-50 MCG/ACT AEPB Inhale 1 puff into the lungs 2 (two) times daily. 1 each 3   folic acid (FOLVITE) 1 MG tablet Take 1 tablet (1 mg total) by mouth daily. 30 tablet 3   furosemide (LASIX) 40 MG tablet Take 1 tablet (40 mg total) by mouth daily as needed. If you gain 3 lbs in 24 hours or 5 lbs in 1 week 30 tablet 1   glucose blood (ACCU-CHEK AVIVA PLUS) test strip Use as instructed 50 each 1   hyoscyamine (LEVSIN SL) 0.125 MG SL tablet Place 1 tablet (0.125 mg total) under the tongue every 6 (six) hours as needed. For abdominal cramping 30 tablet 0   insulin glargine (LANTUS) 100 UNIT/ML Solostar Pen Inject 24 Units into the skin every morning. 3 mL 11   Insulin Pen Needle 32G X 4 MM MISC 1 Needle by Does not apply route in the morning and at bedtime. 120 each 2   ipratropium (ATROVENT HFA) 17 MCG/ACT inhaler Inhale 2 puffs into the lungs every 4 (four) hours as needed for wheezing (COPD).     lactulose (CHRONULAC) 10 GM/15ML solution Take 30 mLs (20 g total) by mouth 2 (two) times daily as needed for mild constipation, moderate constipation or severe constipation. 236 mL 0   lidocaine-prilocaine (EMLA) cream APPLY 1 APPLICATION TOPICALLY AS NEEDED. 30 g 2   LORazepam (ATIVAN) 0.5 MG tablet Take 1 tablet (0.5 mg total) by mouth every 8 (eight) hours as needed for anxiety (nausea). 30 tablet 0   metoCLOPramide (REGLAN) 10 MG tablet Take 1 tablet (10 mg total) by mouth 3 (three) times daily before meals. 30 tablet 0   nicotine polacrilex (NICORETTE) 2 MG gum Take 1 each (2 mg total) by mouth as needed for smoking cessation. 100 tablet 0   nitroGLYCERIN (NITROSTAT) 0.4 MG SL tablet Place 1 tablet (0.4 mg total) under the tongue every 5 (five) minutes as needed for chest pain. 25 tablet 5   nystatin (MYCOSTATIN) 100000 UNIT/ML suspension Take 5 mLs (500,000 Units total) by mouth 4 (four) times daily. 60 mL 0   ondansetron (ZOFRAN-ODT) 8 MG disintegrating tablet Take 1 tablet (8 mg total) by  mouth every 8 (eight) hours as needed for nausea or vomiting. (Patient not taking: Reported on 02/02/2022) 30 tablet 2   oxyCODONE (OXY IR/ROXICODONE) 5 MG immediate release tablet Take 1-2 tablets (5-10 mg total) by mouth every 4 (four) hours as needed for severe pain. 90 tablet 0   oxyCODONE ER (XTAMPZA ER) 13.5 MG C12A Take 13.5 mg by mouth every 12 (twelve) hours. 60 capsule 0   pantoprazole (PROTONIX) 40 MG tablet TAKE 1 TABLET (40 MG TOTAL) BY MOUTH DAILY. 30 tablet 1   polyethylene glycol powder (GAVILAX) 17 GM/SCOOP powder TAKE ONE CAPFUL (17 GRAMS )BY MOUTH DAILY AS NEEDED FOR MILD OR MODERATE CONSTIPATION. 510 g 1  potassium chloride SA (KLOR-CON M) 20 MEQ tablet Take 1 tablet (20 mEq total) by mouth daily as needed. On days you take Lasix 30 tablet 1   prochlorperazine (COMPAZINE) 10 MG tablet Take 1 tablet (10 mg total) by mouth every 6 (six) hours as needed for nausea or vomiting. 30 tablet 0   sertraline (ZOLOFT) 50 MG tablet TAKE 1 TABLET (50 MG TOTAL) BY MOUTH DAILY. 30 tablet 3   valACYclovir (VALTREX) 1000 MG tablet TAKE FOR 3 DAYS AS NEEDED FOR EACH OUTBREAK 30 tablet 11   vitamin B-12 (CYANOCOBALAMIN) 1000 MCG tablet Take 1,000 mcg by mouth daily.     No current facility-administered medications for this visit.    SURGICAL HISTORY:  Past Surgical History:  Procedure Laterality Date   BLADDER SURGERY  1980   "TVT"   BLADDER SURGERY  2003   "TVT"   BREAST EXCISIONAL BIOPSY     BREAST SURGERY Right    I&D for multiple abscesses   CARDIAC CATHETERIZATION     CORONARY ARTERY BYPASS GRAFT N/A 06/30/2021   Procedure: CORONARY ARTERY BYPASS GRAFTING (CABG) x3 USING LEFT INTERNAL MAMMARY ARTERY,  RIGHT RADIAL ARTERY AND LEFT GREATER SAPHENOUS VEIN;  Surgeon: Corliss Skains, MD;  Location: [facility] OR;  Service: Open Heart Surgery;  Laterality: N/A;   cutting balloon     ENDOVEIN HARVEST OF GREATER SAPHENOUS VEIN Left 06/30/2021   Procedure: ENDOVEIN HARVEST OF GREATER SAPHENOUS  VEIN;  Surgeon: Corliss Skains, MD;  Location: [facility] OR;  Service: Open Heart Surgery;  Laterality: Left;   EYE SURGERY Left    laser surgery   FINE NEEDLE ASPIRATION  08/21/2021   Procedure: FINE NEEDLE ASPIRATION (FNA) LINEAR;  Surgeon: Josephine Igo, DO;  Location: [facility] ENDOSCOPY;  Service: Pulmonary;;   INCISE AND DRAIN ABCESS  ; 04/26/11; 08/13/11   right breast   INCISION AND DRAINAGE ABSCESS Right 05/09/2012   Procedure: INCISION AND DRAINAGE RIGHT BREAST ABSCESS;  Surgeon: Wilmon Arms. Corliss Skains, MD;  Location: [facility] OR;  Service: General;  Laterality: Right;   INCISION AND DRAINAGE ABSCESS Right 02/08/2014   Procedure: INCISION AND DRAINAGE RIGHT BREAST ABSCESS;  Surgeon: Manus Rudd, MD;  Location: [facility] OR;  Service: General;  Laterality: Right;   INCISION AND DRAINAGE ABSCESS Right 06/14/2014   Procedure: INCISION AND DRAINAGE RIGHT BREAST ABSCESS;  Surgeon: Manus Rudd, MD;  Location: [facility] OR;  Service: General;  Laterality: Right;   IR IMAGING GUIDED PORT INSERTION  12/08/2021   IRRIGATION AND DEBRIDEMENT ABSCESS  12/19/2010   Procedure: IRRIGATION AND DEBRIDEMENT ABSCESS;  Surgeon: Emelia Loron, MD;  Location: [facility] OR;  Service: General;  Laterality: Right;   IRRIGATION AND DEBRIDEMENT ABSCESS  08/13/2011   Procedure: MINOR INCISION AND DRAINAGE OF ABSCESS;  Surgeon: Cherylynn Ridges, MD;  Location: [facility] OR;  Service: General;  Laterality: Right;  Right Breast    IRRIGATION AND DEBRIDEMENT ABSCESS  11/17/2011   Procedure: IRRIGATION AND DEBRIDEMENT ABSCESS;  Surgeon: Wilmon Arms. Corliss Skains, MD;  Location: [facility] OR;  Service: General;  Laterality: Right;  irrigation and debridement right recurrent breast abscess   LARYNX SURGERY     LEFT HEART CATH AND CORONARY ANGIOGRAPHY N/A 04/09/2021   Procedure: LEFT HEART CATH AND CORONARY ANGIOGRAPHY;  Surgeon: Corky Crafts, MD;  Location: [facility] INVASIVE CV LAB;  Service: Cardiovascular;  Laterality: N/A;   LEFT HEART CATHETERIZATION WITH CORONARY  ANGIOGRAM N/A 05/29/2012   Procedure: LEFT HEART CATHETERIZATION WITH CORONARY ANGIOGRAM & PTCA;  Surgeon: Clovis Pu  Tresa Endo, MD;  Location: [facility] CATH LAB;  Service: Cardiovascular; Bifurcation dRCA-RPAD/PLA & rPDA  PTCA.   LEFT HEART CATHETERIZATION WITH CORONARY ANGIOGRAM N/A 11/08/2013   Procedure: LEFT HEART CATHETERIZATION WITH CORONARY ANGIOGRAM and Coronary Stent Intervention;  Surgeon: Marykay Lex, MD;  Location: [facility] C[facility] LAB;  Service: Cardiovascular; mLAD 80% (FFR 0.73) - resolute DES 3.0 x 22 mm (postdilated to 3.3 mm->3.5 mm)   NM MYOVIEW LTD  01/26/2009   Normal study, no evidence of ischemia, EF 67%   ployp removed from voice box 03/30/12     RADIAL ARTERY HARVEST Right 06/30/2021   Procedure: RADIAL ARTERY HARVEST;  Surgeon: Corliss Skains, MD;  Location: [facility];  Service: Open Heart Surgery;  Laterality: Right;   REFRACTIVE SURGERY  ~ 2010   right   TEE WITHOUT CARDIOVERSION N/A 06/30/2021   Procedure: TRANSESOPHAGEAL ECHOCARDIOGRAM (TEE);  Surgeon: Corliss Skains, MD;  Location[facility];  Service: Open Heart Surgery;  Laterality: N/A;   TONSILLECTOMY  1988   VIDEO BRONCHOSCOPY WITH ENDOBRONCHIAL ULTRASOUND Bilateral 08/21/2021   Procedure: VIDEO BRONCHOSCOPY WITH ENDOBRONCHIAL ULTRASOUND;  Surgeon: Josephine Igo, DO;  Location: [facility];  Service: Pulmonary;  Laterality: Bilateral;    REVIEW OF SYSTEMS:  A comprehensive review of systems was negative except for: Constitutional: positive for fatigue Gastrointestinal: positive for constipation   PHYSICAL EXAMINATION: General appearance: alert, cooperative, fatigued, and no distress Head: Normocephalic, without obvious abnormality, atraumatic Neck: no adenopathy, no JVD, supple, symmetrical, trachea midline, and thyroid not enlarged, symmetric, no tenderness/mass/nodules Lymph nodes: Cervical, supraclavicular, and axillary nodes normal. Resp: clear to auscultation bilaterally Back: symmetric, no curvature. ROM  normal. No CVA tenderness. Cardio: regular rate and rhythm, S1, S2 normal, no murmur, click, rub or gallop GI: Tenderness to palpation Extremities: edema trace edema in the ankles  ECOG PERFORMANCE STATUS: 1 - Symptomatic but completely ambulatory  Blood pressure 118/65, pulse 66, temperature (!) 97.5 F (36.4 C), temperature source Temporal, resp. rate 13, weight 196 lb (88.9 kg), last menstrual period 06/12/2016, SpO2 100 %.  LABORATORY DATA: Lab Results  Component Value Date   WBC 4.7 05/12/2022   HGB 9.9 (L) 05/12/2022   HCT 29.7 (L) 05/12/2022   MCV 102.4 (H) 05/12/2022   PLT 276 05/12/2022      Chemistry      Component Value Date/Time   NA 140 05/12/2022 1009   NA 140 04/03/2021 1059   K 3.9 05/12/2022 1009   CL 108 05/12/2022 1009   CO2 29 05/12/2022 1009   BUN 10 05/12/2022 1009   BUN 13 04/03/2021 1059   CREATININE 0.87 05/12/2022 1009   CREATININE 0.65 11/06/2013 1520      Component Value Date/Time   CALCIUM 8.7 (L) 05/12/2022 1009   ALKPHOS 64 05/12/2022 1009   AST 27 05/12/2022 1009   ALT 24 05/12/2022 1009   BILITOT 0.5 05/12/2022 1009       RADIOGRAPHIC STUDIES: No results found.  ASSESSMENT AND PLAN: This is a very pleasant 57 years old African-American female recently diagnosed with a stage IV (T2 a, N2, M1 C) non-small cell lung cancer, adenocarcinoma presented with right upper lobe lung mass in addition to right hilar and mediastinal lymphadenopathy as well as innumerable bilateral pulmonary nodules and bone and liver metastasis diagnosed in August 2023 with positive KRAS G12C mutation and PD-L1 expression of 89%. She is currently undergoing systemic chemotherapy with carboplatin for AUC of 5, Alimta 500 Mg/M2 and Keytruda 200 Mg IV every 3  weeks status post 12 cycles.  The patient has been tolerating this treatment fairly well except for the frequent nausea and vomiting.  She is currently on Zofran ODT with some improvement.  She has an appointment  with [facility] gastroenterology on June 23, 2022. I recommended for the patient to proceed with cycle #13 today as planned. I will see her back for follow-up visit in 3 weeks for evaluation with repeat CT scan of the chest, abdomen and pelvis for restaging of her disease. For the dyspepsia and epigastric pain, she will continue her current treatment with Protonix. For the pain management she is followed by the palliative care team and currently on Xtampza ER 13.5 mg every 12 hours in addition to Oxy IR for breakthrough pain. The patient was advised to call immediately if she has any concerning symptoms in the interval. The patient voices understanding of current disease status and treatment options and is in agreement with the current care plan.  All questions were answered. The patient knows to call the clinic with any problems, questions or concerns. We can certainly see the patient much sooner if necessary. The total time spent in the appointment was 20 minutes.  Disclaimer: This note was dictated with voice recognition software. Similar sounding words can inadvertently be transcribed and may not be corrected upon review.

## 2022-05-12 NOTE — Patient Instructions (Signed)
[facility]  Discharge Instructions: Thank you for choosing [facility] to provide your oncology and hematology care.   If you have a lab appointment with the Cancer Center, please go directly to the Cancer Center and check in at the registration area.   Wear comfortable clothing and clothing appropriate for easy access to any Portacath or PICC line.   We strive to give you quality time with your provider. You may need to reschedule your appointment if you arrive late (15 or more minutes).  Arriving late affects you and other patients whose appointments are after yours.  Also, if you miss three or more appointments without notifying the office, you may be dismissed from the clinic at the provider's discretion.      For prescription refill requests, have your pharmacy contact our office and allow 72 hours for refills to be completed.    Today you received the following chemotherapy and/or immunotherapy agents: Keytruda and Alimta      To help prevent nausea and vomiting after your treatment, we encourage you to take your nausea medication as directed.  BELOW ARE SYMPTOMS THAT SHOULD BE REPORTED IMMEDIATELY: *FEVER GREATER THAN 100.4 F (38 C) OR HIGHER *CHILLS OR SWEATING *NAUSEA AND VOMITING THAT IS NOT CONTROLLED WITH YOUR NAUSEA MEDICATION *UNUSUAL SHORTNESS OF BREATH *UNUSUAL BRUISING OR BLEEDING *URINARY PROBLEMS (pain or burning when urinating, or frequent urination) *BOWEL PROBLEMS (unusual diarrhea, constipation, pain near the anus) TENDERNESS IN MOUTH AND THROAT WITH OR WITHOUT PRESENCE OF ULCERS (sore throat, sores in mouth, or a toothache) UNUSUAL RASH, SWELLING OR PAIN  UNUSUAL VAGINAL DISCHARGE OR ITCHING   Items with * indicate a potential emergency and should be followed up as soon as possible or go to the Emergency Department if any problems should occur.  Please show the CHEMOTHERAPY ALERT CARD or IMMUNOTHERAPY ALERT  CARD at check-in to the Emergency Department and triage nurse.  Should you have questions after your visit or need to cancel or reschedule your appointment, please contact [facility]  Dept: [phone]  and follow the prompts.  Office hours are 8:00 a.m. to 4:30 p.m. Monday - Friday. Please note that voicemails left after 4:00 p.m. may not be returned until the following business day.  We are closed weekends and major holidays. You have access to a nurse at all times for urgent questions. Please call the main number to the clinic Dept: [phone] and follow the prompts.   For any non-urgent questions, you may also contact your provider using MyChart. We now offer e-Visits for anyone 18 and older to request care online for non-urgent symptoms. For details visit [email or web address].   Also download the MyChart app! Go to the app store, search "MyChart", open the app, select [place], and log in with your MyChart username and password.   

## 2022-05-12 NOTE — Progress Notes (Signed)
Patient seen by Dr. Mohamed  Vitals are within treatment parameters.  Labs reviewed: and are within treatment parameters.  Per physician team, patient is ready for treatment and there are NO modifications to the treatment plan.  

## 2022-05-19 ENCOUNTER — Other Ambulatory Visit: Payer: Self-pay | Admitting: Family Medicine

## 2022-05-19 DIAGNOSIS — F419 Anxiety disorder, unspecified: Secondary | ICD-10-CM

## 2022-05-21 ENCOUNTER — Telehealth: Payer: Self-pay | Admitting: Internal Medicine

## 2022-05-21 NOTE — Telephone Encounter (Signed)
Called patient regarding May, June and July appointments. Patient is notified.

## 2022-05-28 ENCOUNTER — Encounter (HOSPITAL_BASED_OUTPATIENT_CLINIC_OR_DEPARTMENT_OTHER): Payer: Self-pay

## 2022-05-28 ENCOUNTER — Ambulatory Visit (HOSPITAL_BASED_OUTPATIENT_CLINIC_OR_DEPARTMENT_OTHER)
Admission: RE | Admit: 2022-05-28 | Discharge: 2022-05-28 | Disposition: A | Discharge status: Home / Self Care | Payer: Medicaid Other | Source: Ambulatory Visit | Attending: Internal Medicine | Admitting: Internal Medicine

## 2022-05-28 DIAGNOSIS — C3490 Malignant neoplasm of unspecified part of unspecified bronchus or lung: Secondary | ICD-10-CM

## 2022-05-28 MED ORDER — HEPARIN SOD (PORK) LOCK FLUSH 100 UNIT/ML IV SOLN
500.0000 [IU] | Freq: Once | INTRAVENOUS | Status: AC
  Administered 2022-05-28: 500 [IU] via INTRAVENOUS
  Filled 2022-05-28: qty 5

## 2022-05-28 MED ORDER — IOHEXOL 300 MG/ML  SOLN
100.0000 mL | Freq: Once | INTRAMUSCULAR | Status: AC | PRN
  Administered 2022-05-28: 100 mL via INTRAVENOUS

## 2022-05-28 NOTE — Progress Notes (Unsigned)
Palliative Medicine [facility]  [phone] [phone]   Name: Cassidy [name] Date: 05/28/2022 MRN: [id number]  DOB: Nov 10, 1965  Patient Care Team: [name], DO as PCP - General (Family Medicine) O'Neal, Ronnald Ramp, MD as PCP - Cardiology (Cardiology) Emelia Loron, MD as Consulting Physician (General Surgery)    INTERVAL HISTORY: Cassidy [name] is a 57 y.o. 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  Palliative ask to see for symptom management and goals of care.   SOCIAL HISTORY:    Ms. Birdsong reports that she has been smoking cigarettes. She started smoking about 43 years ago. She has a 21.00 pack-year smoking history. She has never used smokeless tobacco. She reports that she does not currently use drugs. She reports that she does not drink alcohol.  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  s/p PCTA to dRCA and ostial RPAV-PLA vessel + PDA branch (05/2012)  b. [place] s/p DES to mLAD - Resolute DES 3.0 x 22 (3.8mm -->3.3 mm)   Bipolar depression (HCC)    Breast abscess    a. right side.    COPD (chronic obstructive pulmonary disease) (HCC)    Depression    Diabetic peripheral neuropathy (HCC)    Fibromyalgia    Genital warts    GERD (gastroesophageal reflux disease)    History of hiatal hernia    HLD (hyperlipidemia)    Hypertension    Pain in limb    a. LE VENOUS DUPLEX, 02/05/2009 - no evidence of deep vein thrombosis, Baker's cyst   Pneumonia    PTSD (post-traumatic  stress disorder)    Sickle cell trait (HCC)    Tobacco abuse    Tooth caries    Type II diabetes mellitus (HCC)     ALLERGIES:  is allergic to amoxil [amoxicillin], chantix [varenicline], suboxone [buprenorphine hcl-naloxone hcl], and emend [fosaprepitant dimeglumine].  MEDICATIONS:  Current Outpatient Medications  Medication Sig Dispense Refill   aspirin EC 81 MG tablet Take 81 mg by mouth daily.     atorvastatin (LIPITOR) 40 MG tablet TAKE 1 TABLET (40 MG TOTAL) BY MOUTH DAILY. 90 tablet 3   busPIRone (BUSPAR) 15 MG tablet Take 0.5 tablets (7.5 mg total) by mouth 2 (two) times daily. 60 tablet 3   Carboxymethylcellul-Glycerin (LUBRICATING EYE DROPS OP) Place 1 drop into both eyes daily as needed (dry eyes).     carvedilol (COREG) 12.5 MG tablet TAKE 1 TABLET (12.5 MG TOTAL) BY MOUTH 2 (TWO) TIMES DAILY WITH A MEAL. 180 tablet 1   diclofenac Sodium (VOLTAREN) 1 % GEL Apply 1 application topically 3 times daily as needed for pain 2 g 3   ferrous sulfate 325 (65 FE) MG tablet Take 325 mg by mouth daily with breakfast.     fluticasone (FLONASE) 50 MCG/ACT nasal spray PLACE 1 SPRAY INTO BOTH NOSTRILS DAILY AS NEEDED (CONGESTION). 16 g 1   fluticasone-salmeterol (ADVAIR) 100-50 MCG/ACT AEPB Inhale 1 puff into the lungs 2 (two) times daily. 1 each 3   folic acid (FOLVITE) 1 MG tablet Take  1 tablet (1 mg total) by mouth daily. 30 tablet 3   furosemide (LASIX) 40 MG tablet Take 1 tablet (40 mg total) by mouth daily as needed. If you gain 3 lbs in 24 hours or 5 lbs in 1 week 30 tablet 1   glucose blood (ACCU-CHEK AVIVA PLUS) test strip Use as instructed 50 each 1   hyoscyamine (LEVSIN SL) 0.125 MG SL tablet Place 1 tablet (0.125 mg total) under the tongue every 6 (six) hours as needed. For abdominal cramping 30 tablet 0   insulin glargine (LANTUS) 100 UNIT/ML Solostar Pen Inject 24 Units into the skin every morning. 3 mL 11   Insulin Pen Needle 32G X 4 MM MISC 1 Needle by Does not apply route in  the morning and at bedtime. 120 each 2   ipratropium (ATROVENT HFA) 17 MCG/ACT inhaler Inhale 2 puffs into the lungs every 4 (four) hours as needed for wheezing (COPD).     lactulose (CHRONULAC) 10 GM/15ML solution Take 30 mLs (20 g total) by mouth 2 (two) times daily as needed for mild constipation, moderate constipation or severe constipation. 236 mL 0   lidocaine-prilocaine (EMLA) cream APPLY 1 APPLICATION TOPICALLY AS NEEDED. 30 g 2   LORazepam (ATIVAN) 0.5 MG tablet Take 1 tablet (0.5 mg total) by mouth every 8 (eight) hours as needed for anxiety (nausea). 30 tablet 0   metoCLOPramide (REGLAN) 10 MG tablet Take 1 tablet (10 mg total) by mouth 3 (three) times daily before meals. 60 tablet 0   nicotine polacrilex (NICORETTE) 2 MG gum Take 1 each (2 mg total) by mouth as needed for smoking cessation. 100 tablet 0   nitroGLYCERIN (NITROSTAT) 0.4 MG SL tablet Place 1 tablet (0.4 mg total) under the tongue every 5 (five) minutes as needed for chest pain. 25 tablet 5   nystatin (MYCOSTATIN) 100000 UNIT/ML suspension Take 5 mLs (500,000 Units total) by mouth 4 (four) times daily. 60 mL 0   ondansetron (ZOFRAN-ODT) 8 MG disintegrating tablet Take 1 tablet (8 mg total) by mouth every 8 (eight) hours as needed for nausea or vomiting. (Patient not taking: Reported on 02/02/2022) 30 tablet 2   oxyCODONE (OXY IR/ROXICODONE) 5 MG immediate release tablet Take 1-2 tablets (5-10 mg total) by mouth every 4 (four) hours as needed for severe pain. 90 tablet 0   oxyCODONE ER (XTAMPZA ER) 13.5 MG C12A Take 13.5 mg by mouth every 12 (twelve) hours. 60 capsule 0   pantoprazole (PROTONIX) 40 MG tablet TAKE 1 TABLET (40 MG TOTAL) BY MOUTH DAILY. 30 tablet 1   polyethylene glycol powder (GAVILAX) 17 GM/SCOOP powder TAKE ONE CAPFUL (17 GRAMS )BY MOUTH DAILY AS NEEDED FOR MILD OR MODERATE CONSTIPATION. 510 g 1   potassium chloride SA (KLOR-CON M) 20 MEQ tablet Take 1 tablet (20 mEq total) by mouth daily as needed. On days you  take Lasix 30 tablet 1   prochlorperazine (COMPAZINE) 10 MG tablet TAKE 1 TABLET (10 MG TOTAL) BY MOUTH EVERY 6 (SIX) HOURS AS NEEDED FOR NAUSEA OR VOMITING. 30 tablet 0   sertraline (ZOLOFT) 50 MG tablet TAKE 1 TABLET (50 MG TOTAL) BY MOUTH DAILY. 30 tablet 3   valACYclovir (VALTREX) 1000 MG tablet TAKE FOR 3 DAYS AS NEEDED FOR EACH OUTBREAK 30 tablet 11   vitamin B-12 (CYANOCOBALAMIN) 1000 MCG tablet Take 1,000 mcg by mouth daily.     No current facility-administered medications for this visit.    VITAL SIGNS: LMP 06/12/2016 (Approximate)  There were no  vitals filed for this visit.  Estimated body mass index is 30.24 kg/m as calculated from the following:   Height as of 04/07/22: 5' 7.5" (1.715 m).   Weight as of 05/12/22: 196 lb (88.9 kg).   PERFORMANCE STATUS (ECOG) : 1 - Symptomatic but completely ambulatory   Physical Exam: General: NAD Pulmonary: normal breathing pattern  Extremities: trace pedal edema, no joint deformities Skin: no rashes Neurological: alert and oriented x 4, mood and affect appropriate   IMPRESSION: .   Ms. Coffey presents to clinic today for follow-up. No acute distress. Is doing well overall. Shares recently able to attend her nephew's wedding and enjoyed spending time with family. Denies nausea, vomiting, diarrhea.   Neoplasm related pain Ms. Mehus reports that her pain is well controlled on current regimen. She is taking Xtampza 13 mg every 12 hours and Oxycodone 5-10 mg every 4 hours as needed for breakthrough pain. Does not require around the clock.   Plan in place, per Neurology and PCP, to transition patient from Zoloft to Cymbalta. It is felt that this will serve to help both patient's depression and pain.  Is also using topical Voltaren to hip area for aches during the evening and night time hours.   2.  Nausea Controlled on regimen.   3. Constipation  Is better controlled with oral regimen of Miralax, Senna, and lactulose. Linzess every  other day. Reports bowel movement on yesterday. She has increased fiber intake by eating oatmeal and other grains, fruits, and vegetables.   We will continue to closely monitor symptoms and adjust medications accordingly. No changes today.   We discussed the importance of continued conversation with family and their medical providers regarding overall plan of care and treatment options, ensuring decisions are within the context of the patients values and GOCs.  PLAN: Xtampza 13 mg every 12 hours. Oxycodone IR 5-10 mg every 6 hours as needed for breakthrough pain. Senna-S and Miralax on a daily basis for the management of constipation. Zofran, Compazine, and Ativan as needed for nausea Linzess as needed  Ongoing emotional support and symptom management. Palliative will plan to see patient back in 3-4 weeks in collaboration with her oncology appointments.   Patient expressed understanding and was in agreement with this plan. She also understands that She can call the clinic at any time with any questions, concerns, or complaints.    Any controlled substances utilized were prescribed in the context of palliative care. PDMP has been reviewed.   Any controlled substances utilized were prescribed in the context of palliative care. PDMP has been reviewed.    Visit consisted of counseling and education dealing with the complex and emotionally intense issues of symptom management and palliative care in the setting of serious and potentially life-threatening illness.Greater than 50%  of this time was spent counseling and coordinating care related to the above assessment and plan.  Willette Alma, AGPCNP-BC  Palliative Medicine Team/[facility]  *Please note that this is a verbal dictation therefore any spelling or grammatical errors are due to the "[employer]agon [employer] One" system interpretation.

## 2022-06-01 ENCOUNTER — Inpatient Hospital Stay (HOSPITAL_BASED_OUTPATIENT_CLINIC_OR_DEPARTMENT_OTHER): Payer: Medicaid Other | Admitting: Internal Medicine

## 2022-06-01 ENCOUNTER — Encounter: Payer: Self-pay | Admitting: Nurse Practitioner

## 2022-06-01 ENCOUNTER — Other Ambulatory Visit: Payer: Self-pay

## 2022-06-01 ENCOUNTER — Inpatient Hospital Stay: Payer: Medicaid Other

## 2022-06-01 ENCOUNTER — Encounter: Payer: Self-pay | Admitting: Internal Medicine

## 2022-06-01 ENCOUNTER — Inpatient Hospital Stay (HOSPITAL_BASED_OUTPATIENT_CLINIC_OR_DEPARTMENT_OTHER): Payer: Medicaid Other | Admitting: Nurse Practitioner

## 2022-06-01 ENCOUNTER — Encounter: Payer: Self-pay | Admitting: Medical Oncology

## 2022-06-01 DIAGNOSIS — C3491 Malignant neoplasm of unspecified part of right bronchus or lung: Secondary | ICD-10-CM

## 2022-06-01 DIAGNOSIS — R530 Neoplastic (malignant) related fatigue: Secondary | ICD-10-CM

## 2022-06-01 DIAGNOSIS — F32A Depression, unspecified: Secondary | ICD-10-CM | POA: Diagnosis not present

## 2022-06-01 DIAGNOSIS — Z515 Encounter for palliative care: Secondary | ICD-10-CM

## 2022-06-01 DIAGNOSIS — G893 Neoplasm related pain (acute) (chronic): Secondary | ICD-10-CM | POA: Diagnosis not present

## 2022-06-01 DIAGNOSIS — K5903 Drug induced constipation: Secondary | ICD-10-CM | POA: Diagnosis not present

## 2022-06-01 DIAGNOSIS — Z5112 Encounter for antineoplastic immunotherapy: Secondary | ICD-10-CM | POA: Diagnosis not present

## 2022-06-01 DIAGNOSIS — Z95828 Presence of other vascular implants and grafts: Secondary | ICD-10-CM

## 2022-06-01 LAB — CMP (CANCER CENTER ONLY)
ALT: 19 U/L (ref 0–44)
AST: 22 U/L (ref 15–41)
Albumin: 3.5 g/dL (ref 3.5–5.0)
Alkaline Phosphatase: 64 U/L (ref 38–126)
Anion gap: 3 — ABNORMAL LOW (ref 5–15)
BUN: 10 mg/dL (ref 6–20)
CO2: 27 mmol/L (ref 22–32)
Calcium: 8.5 mg/dL — ABNORMAL LOW (ref 8.9–10.3)
Chloride: 108 mmol/L (ref 98–111)
Creatinine: 0.98 mg/dL (ref 0.44–1.00)
GFR, Estimated: >60 mL/min (ref >60)
Glucose, Bld: 140 mg/dL — ABNORMAL HIGH (ref 70–99)
Potassium: 4 mmol/L (ref 3.5–5.1)
Sodium: 138 mmol/L (ref 135–145)
Total Bilirubin: 0.4 mg/dL (ref 0.3–1.2)
Total Protein: 6.3 g/dL — ABNORMAL LOW (ref 6.5–8.1)

## 2022-06-01 LAB — CBC WITH DIFFERENTIAL (CANCER CENTER ONLY)
Abs Immature Granulocytes: 0.03 10*3/uL (ref 0.00–0.07)
Basophils Absolute: 0 10*3/uL (ref 0.0–0.1)
Basophils Relative: 0 %
Eosinophils Absolute: 0.1 10*3/uL (ref 0.0–0.5)
Eosinophils Relative: 1 %
HCT: 27 % — ABNORMAL LOW (ref 36.0–46.0)
Hemoglobin: 9.4 g/dL — ABNORMAL LOW (ref 12.0–15.0)
Immature Granulocytes: 1 %
Lymphocytes Relative: 15 %
Lymphs Abs: 0.8 10*3/uL (ref 0.7–4.0)
MCH: 36 pg — ABNORMAL HIGH (ref 26.0–34.0)
MCHC: 34.8 g/dL (ref 30.0–36.0)
MCV: 103.4 fL — ABNORMAL HIGH (ref 80.0–100.0)
Monocytes Absolute: 0.6 10*3/uL (ref 0.1–1.0)
Monocytes Relative: 12 %
Neutro Abs: 4 10*3/uL (ref 1.7–7.7)
Neutrophils Relative %: 71 %
Platelet Count: 267 10*3/uL (ref 150–400)
RBC: 2.61 MIL/uL — ABNORMAL LOW (ref 3.87–5.11)
RDW: 14.9 % (ref 11.5–15.5)
WBC Count: 5.5 10*3/uL (ref 4.0–10.5)
nRBC: 0 % (ref 0.0–0.2)

## 2022-06-01 MED ORDER — SODIUM CHLORIDE 0.9 % IV SOLN: Freq: Once | INTRAVENOUS | Status: AC

## 2022-06-01 MED ORDER — SODIUM CHLORIDE 0.9 % IV SOLN
400.0000 mg/m2 | Freq: Once | INTRAVENOUS | Status: AC
  Administered 2022-06-01: 800 mg via INTRAVENOUS
  Filled 2022-06-01: qty 20

## 2022-06-01 MED ORDER — SODIUM CHLORIDE 0.9% FLUSH
10.0000 mL | Freq: Once | INTRAVENOUS | Status: AC
  Administered 2022-06-01: 10 mL

## 2022-06-01 MED ORDER — SODIUM CHLORIDE 0.9% FLUSH
10.0000 mL | INTRAVENOUS | Status: DC | PRN
  Administered 2022-06-01: 10 mL

## 2022-06-01 MED ORDER — OXYCODONE HCL 5 MG PO TABS
5.0000 mg | ORAL_TABLET | ORAL | 0 refills | Status: DC | PRN
Start: 2022-06-01 — End: 2022-06-22

## 2022-06-01 MED ORDER — HEPARIN SOD (PORK) LOCK FLUSH 100 UNIT/ML IV SOLN
500.0000 [IU] | Freq: Once | INTRAVENOUS | Status: AC | PRN
  Administered 2022-06-01: 500 [IU]

## 2022-06-01 MED ORDER — PROCHLORPERAZINE MALEATE 10 MG PO TABS
10.0000 mg | ORAL_TABLET | Freq: Once | ORAL | Status: AC
  Administered 2022-06-01: 10 mg via ORAL
  Filled 2022-06-01: qty 1

## 2022-06-01 MED ORDER — XTAMPZA ER 13.5 MG PO C12A
13.5000 mg | EXTENDED_RELEASE_CAPSULE | Freq: Two times a day (BID) | ORAL | 0 refills | Status: DC
Start: 2022-06-11 — End: 2022-07-14

## 2022-06-01 MED ORDER — SODIUM CHLORIDE 0.9 % IV SOLN
200.0000 mg | Freq: Once | INTRAVENOUS | Status: AC
  Administered 2022-06-01: 200 mg via INTRAVENOUS
  Filled 2022-06-01: qty 200

## 2022-06-01 NOTE — Progress Notes (Signed)
Patient seen by Dr. Mohamed  Vitals are within treatment parameters.  Labs reviewed: and are within treatment parameters.  Per physician team, patient is ready for treatment and there are NO modifications to the treatment plan.  Keytruda /Alimta 

## 2022-06-01 NOTE — Progress Notes (Signed)
[facility] [phone]   [phone]  OFFICE PROGRESS NOTE  [name], DO [address] [place] [address]  DIAGNOSIS: stage IV (T2a, N2, M1c) non-small cell lung cancer, adenocarcinoma presented with right upper lobe lung mass in addition to right hilar and mediastinal lymphadenopathy and innumerable bilateral pulmonary nodules as well as liver and bone metastasis diagnosed in August 2023.   Detected Alteration(s) / Biomarker(s) Associated FDA-approved therapies Clinical Trial Availability % cfDNA or Amplification  KRAS G12C  approved by FDA Adagrasib, Sotorasib Yes 5.3%  IDH1 R132L  approved in other indication Ivosidenib, Olutasidenib Yes 1.9%  PD-L1 expression 89%  PRIOR THERAPY: None  CURRENT THERAPY: Systemic chemotherapy with carboplatin for AUC of 5, Alimta 500 Mg/M2 and Keytruda 200 Mg IV every 3 weeks.  First dose September 02, 2021.  Status post 13 cycles.  Starting from cycle #5 she is on maintenance treatment with Alimta and Keytruda every 3 weeks.  INTERVAL HISTORY: Cassidy [name] 57 y.o. female returns to clinic today for follow-up visit.  The patient is feeling fine today with no concerning complaints except for the baseline mild fatigue as well as intermittent abdominal pain.  She denied having chest pain, shortness of breath, cough or hemoptysis.  She has no nausea, vomiting but continues to have intermittent abdominal pain with bloating.  She has no diarrhea but intermittent constipation.  She denied having any recent weight loss or night sweats.  She has no headache or visual changes.  She has been tolerating her maintenance treatment with Alimta and Keytruda fairly well.  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  s/p PCTA to dRCA and ostial RPAV-PLA vessel + PDA branch (05/2012)  b. [place] s/p DES to mLAD - Resolute DES 3.0 x 22 (3.68mm -->3.3 mm)   Bipolar depression (HCC)    Breast abscess    a. right side.    COPD (chronic obstructive pulmonary disease) (HCC)    Depression    Diabetic peripheral neuropathy (HCC)    Fibromyalgia    Genital warts    GERD (gastroesophageal reflux disease)    History of hiatal hernia    HLD (hyperlipidemia)    Hypertension    Pain in limb    a. LE VENOUS DUPLEX, 02/05/2009 - no evidence of deep vein thrombosis, Baker's cyst   Pneumonia    PTSD (post-traumatic stress disorder)    Sickle cell trait (HCC)    Tobacco abuse    Tooth caries    Type II diabetes mellitus (HCC)     ALLERGIES:  is allergic to amoxil [amoxicillin], chantix [varenicline], suboxone [buprenorphine hcl-naloxone hcl], and emend [fosaprepitant dimeglumine].  MEDICATIONS:  Current Outpatient Medications  Medication Sig Dispense Refill   aspirin EC 81 MG tablet Take 81 mg by mouth daily.     atorvastatin (LIPITOR) 40 MG tablet TAKE 1 TABLET (40 MG TOTAL) BY MOUTH DAILY. 90 tablet 3   busPIRone (BUSPAR) 15 MG tablet Take 0.5 tablets (7.5 mg total) by mouth 2 (two) times daily. 60 tablet 3   Carboxymethylcellul-Glycerin (LUBRICATING EYE DROPS OP) Place 1 drop into both eyes daily as needed (dry eyes).     carvedilol (COREG) 12.5 MG tablet TAKE 1 TABLET (12.5 MG TOTAL)  BY MOUTH 2 (TWO) TIMES DAILY WITH A MEAL. 180 tablet 1   diclofenac Sodium (VOLTAREN) 1 % GEL Apply 1 application topically 3 times daily as needed for pain 2 g 3   ferrous sulfate 325 (65 FE) MG tablet Take 325 mg by mouth daily with breakfast.     fluticasone (FLONASE) 50 MCG/ACT nasal spray PLACE 1 SPRAY INTO BOTH NOSTRILS DAILY AS NEEDED (CONGESTION). 16 g 1   fluticasone-salmeterol (ADVAIR) 100-50 MCG/ACT AEPB Inhale 1 puff into the lungs 2 (two) times daily. 1 each 3   folic acid (FOLVITE) 1 MG tablet Take 1 tablet  (1 mg total) by mouth daily. 30 tablet 3   furosemide (LASIX) 40 MG tablet Take 1 tablet (40 mg total) by mouth daily as needed. If you gain 3 lbs in 24 hours or 5 lbs in 1 week 30 tablet 1   glucose blood (ACCU-CHEK AVIVA PLUS) test strip Use as instructed 50 each 1   hyoscyamine (LEVSIN SL) 0.125 MG SL tablet Place 1 tablet (0.125 mg total) under the tongue every 6 (six) hours as needed. For abdominal cramping 30 tablet 0   insulin glargine (LANTUS) 100 UNIT/ML Solostar Pen Inject 24 Units into the skin every morning. 3 mL 11   Insulin Pen Needle 32G X 4 MM MISC 1 Needle by Does not apply route in the morning and at bedtime. 120 each 2   ipratropium (ATROVENT HFA) 17 MCG/ACT inhaler Inhale 2 puffs into the lungs every 4 (four) hours as needed for wheezing (COPD).     lactulose (CHRONULAC) 10 GM/15ML solution Take 30 mLs (20 g total) by mouth 2 (two) times daily as needed for mild constipation, moderate constipation or severe constipation. 236 mL 0   lidocaine-prilocaine (EMLA) cream APPLY 1 APPLICATION TOPICALLY AS NEEDED. 30 g 2   LORazepam (ATIVAN) 0.5 MG tablet Take 1 tablet (0.5 mg total) by mouth every 8 (eight) hours as needed for anxiety (nausea). 30 tablet 0   metoCLOPramide (REGLAN) 10 MG tablet Take 1 tablet (10 mg total) by mouth 3 (three) times daily before meals. 60 tablet 0   nicotine polacrilex (NICORETTE) 2 MG gum Take 1 each (2 mg total) by mouth as needed for smoking cessation. 100 tablet 0   nitroGLYCERIN (NITROSTAT) 0.4 MG SL tablet Place 1 tablet (0.4 mg total) under the tongue every 5 (five) minutes as needed for chest pain. 25 tablet 5   nystatin (MYCOSTATIN) 100000 UNIT/ML suspension Take 5 mLs (500,000 Units total) by mouth 4 (four) times daily. 60 mL 0   ondansetron (ZOFRAN-ODT) 8 MG disintegrating tablet Take 1 tablet (8 mg total) by mouth every 8 (eight) hours as needed for nausea or vomiting. (Patient not taking: Reported on 02/02/2022) 30 tablet 2   oxyCODONE (OXY  IR/ROXICODONE) 5 MG immediate release tablet Take 1-2 tablets (5-10 mg total) by mouth every 4 (four) hours as needed for severe pain. 90 tablet 0   oxyCODONE ER (XTAMPZA ER) 13.5 MG C12A Take 13.5 mg by mouth every 12 (twelve) hours. 60 capsule 0   pantoprazole (PROTONIX) 40 MG tablet TAKE 1 TABLET (40 MG TOTAL) BY MOUTH DAILY. 30 tablet 1   polyethylene glycol powder (GAVILAX) 17 GM/SCOOP powder TAKE ONE CAPFUL (17 GRAMS )BY MOUTH DAILY AS NEEDED FOR MILD OR MODERATE CONSTIPATION. 510 g 1   potassium chloride SA (KLOR-CON M) 20 MEQ tablet Take 1 tablet (20 mEq total) by mouth daily as needed. On days you take Lasix 30 tablet 1  prochlorperazine (COMPAZINE) 10 MG tablet TAKE 1 TABLET (10 MG TOTAL) BY MOUTH EVERY 6 (SIX) HOURS AS NEEDED FOR NAUSEA OR VOMITING. 30 tablet 0   sertraline (ZOLOFT) 50 MG tablet TAKE 1 TABLET (50 MG TOTAL) BY MOUTH DAILY. 30 tablet 3   valACYclovir (VALTREX) 1000 MG tablet TAKE FOR 3 DAYS AS NEEDED FOR EACH OUTBREAK 30 tablet 11   vitamin B-12 (CYANOCOBALAMIN) 1000 MCG tablet Take 1,000 mcg by mouth daily.     No current facility-administered medications for this visit.    SURGICAL HISTORY:  Past Surgical History:  Procedure Laterality Date   BLADDER SURGERY  1980   "TVT"   BLADDER SURGERY  2003   "TVT"   BREAST EXCISIONAL BIOPSY     BREAST SURGERY Right    I&D for multiple abscesses   CARDIAC CATHETERIZATION     CORONARY ARTERY BYPASS GRAFT N/A 06/30/2021   Procedure: CORONARY ARTERY BYPASS GRAFTING (CABG) x3 USING LEFT INTERNAL MAMMARY ARTERY,  RIGHT RADIAL ARTERY AND LEFT GREATER SAPHENOUS VEIN;  Surgeon: Corliss Skains, MD;  Location: [facility] OR;  Service: Open Heart Surgery;  Laterality: N/A;   cutting balloon     ENDOVEIN HARVEST OF GREATER SAPHENOUS VEIN Left 06/30/2021   Procedure: ENDOVEIN HARVEST OF GREATER SAPHENOUS VEIN;  Surgeon: Corliss Skains, MD;  Location: [facility] OR;  Service: Open Heart Surgery;  Laterality: Left;   EYE SURGERY Left     laser surgery   FINE NEEDLE ASPIRATION  08/21/2021   Procedure: FINE NEEDLE ASPIRATION (FNA) LINEAR;  Surgeon: Josephine Igo, DO;  Location: [facility] ENDOSCOPY;  Service: Pulmonary;;   INCISE AND DRAIN ABCESS  ; 04/26/11; 08/13/11   right breast   INCISION AND DRAINAGE ABSCESS Right 05/09/2012   Procedure: INCISION AND DRAINAGE RIGHT BREAST ABSCESS;  Surgeon: Wilmon Arms. Corliss Skains, MD;  Location: [facility] OR;  Service: General;  Laterality: Right;   INCISION AND DRAINAGE ABSCESS Right 02/08/2014   Procedure: INCISION AND DRAINAGE RIGHT BREAST ABSCESS;  Surgeon: Manus Rudd, MD;  Location: [facility] OR;  Service: General;  Laterality: Right;   INCISION AND DRAINAGE ABSCESS Right 06/14/2014   Procedure: INCISION AND DRAINAGE RIGHT BREAST ABSCESS;  Surgeon: Manus Rudd, MD;  Location: [facility] OR;  Service: General;  Laterality: Right;   IR IMAGING GUIDED PORT INSERTION  12/08/2021   IRRIGATION AND DEBRIDEMENT ABSCESS  12/19/2010   Procedure: IRRIGATION AND DEBRIDEMENT ABSCESS;  Surgeon: Emelia Loron, MD;  Location: [facility] OR;  Service: General;  Laterality: Right;   IRRIGATION AND DEBRIDEMENT ABSCESS  08/13/2011   Procedure: MINOR INCISION AND DRAINAGE OF ABSCESS;  Surgeon: Cherylynn Ridges, MD;  Location: [facility] OR;  Service: General;  Laterality: Right;  Right Breast    IRRIGATION AND DEBRIDEMENT ABSCESS  11/17/2011   Procedure: IRRIGATION AND DEBRIDEMENT ABSCESS;  Surgeon: Wilmon Arms. Corliss Skains, MD;  Location: [facility] OR;  Service: General;  Laterality: Right;  irrigation and debridement right recurrent breast abscess   LARYNX SURGERY     LEFT HEART CATH AND CORONARY ANGIOGRAPHY N/A 04/09/2021   Procedure: LEFT HEART CATH AND CORONARY ANGIOGRAPHY;  Surgeon: Corky Crafts, MD;  Location: [facility] INVASIVE CV LAB;  Service: Cardiovascular;  Laterality: N/A;   LEFT HEART CATHETERIZATION WITH CORONARY ANGIOGRAM N/A 05/29/2012   Procedure: LEFT HEART CATHETERIZATION WITH CORONARY ANGIOGRAM & PTCA;  Surgeon: Lennette Bihari, MD;  Location:  [facility] CATH LAB;  Service: Cardiovascular; Bifurcation dRCA-RPAD/PLA & rPDA  PTCA.   LEFT HEART CATHETERIZATION WITH CORONARY ANGIOGRAM N/A 11/08/2013   Procedure:  LEFT HEART CATHETERIZATION WITH CORONARY ANGIOGRAM and Coronary Stent Intervention;  Surgeon: Marykay Lex, MD;  Location: [facility] CATH LAB;  Service: Cardiovascular; mLAD 80% (FFR 0.73) - resolute DES 3.0 x 22 mm (postdilated to 3.3 mm->3.5 mm)   NM MYOVIEW LTD  01/26/2009   Normal study, no evidence of ischemia, EF 67%   ployp removed from voice box 03/30/12     RADIAL ARTERY HARVEST Right 06/30/2021   Procedure: RADIAL ARTERY HARVEST;  Surgeon: Corliss Skains, MD;  Location: [facility];  Service: Open Heart Surgery;  Laterality: Right;   REFRACTIVE SURGERY  ~ 2010   right   TEE WITHOUT CARDIOVERSION N/A 06/30/2021   Procedure: TRANSESOPHAGEAL ECHOCARDIOGRAM (TEE);  Surgeon: Corliss Skains, MD;  Location: [facility] OR;  Service: Open Heart Surgery;  Laterality: N/A;   TONSILLECTOMY  1988   VIDEO BRONCHOSCOPY WITH ENDOBRONCHIAL ULTRASOUND Bilateral 08/21/2021   Procedure: VIDEO BRONCHOSCOPY WITH ENDOBRONCHIAL ULTRASOUND;  Surgeon: Josephine Igo, DO;  Location: [facility];  Service: Pulmonary;  Laterality: Bilateral;    REVIEW OF SYSTEMS:  Constitutional: positive for fatigue Eyes: negative Ears, nose, mouth, throat, and face: negative Respiratory: negative Cardiovascular: negative Gastrointestinal: positive for constipation and nausea Genitourinary:negative Integument/breast: negative Hematologic/lymphatic: negative Musculoskeletal:negative Neurological: negative Behavioral/Psych: negative Endocrine: negative Allergic/Immunologic: negative   PHYSICAL EXAMINATION: General appearance: alert, cooperative, fatigued, and no distress Head: Normocephalic, without obvious abnormality, atraumatic Neck: no adenopathy, no JVD, supple, symmetrical, trachea midline, and thyroid not enlarged, symmetric, no tenderness/mass/nodules Lymph  nodes: Cervical, supraclavicular, and axillary nodes normal. Resp: clear to auscultation bilaterally Back: symmetric, no curvature. ROM normal. No CVA tenderness. Cardio: regular rate and rhythm, S1, S2 normal, no murmur, click, rub or gallop GI: Tenderness to palpation Extremities: edema trace edema in the ankles Neurologic: Alert and oriented X 3, normal strength and tone. Normal symmetric reflexes. Normal coordination and gait  ECOG PERFORMANCE STATUS: 1 - Symptomatic but completely ambulatory  Blood pressure 100/60, pulse 73, temperature (!) 97.2 F (36.2 C), temperature source Temporal, resp. rate 16, weight 196 lb 9.6 oz (89.2 kg), last menstrual period 06/12/2016, SpO2 100 %.  LABORATORY DATA: Lab Results  Component Value Date   WBC 5.5 06/01/2022   HGB 9.4 (L) 06/01/2022   HCT 27.0 (L) 06/01/2022   MCV 103.4 (H) 06/01/2022   PLT 267 06/01/2022      Chemistry      Component Value Date/Time   NA 140 05/12/2022 1009   NA 140 04/03/2021 1059   K 3.9 05/12/2022 1009   CL 108 05/12/2022 1009   CO2 29 05/12/2022 1009   BUN 10 05/12/2022 1009   BUN 13 04/03/2021 1059   CREATININE 0.87 05/12/2022 1009   CREATININE 0.65 11/06/2013 1520      Component Value Date/Time   CALCIUM 8.7 (L) 05/12/2022 1009   ALKPHOS 64 05/12/2022 1009   AST 27 05/12/2022 1009   ALT 24 05/12/2022 1009   BILITOT 0.5 05/12/2022 1009       RADIOGRAPHIC STUDIES: CT Chest W Contrast  Result Date: 05/31/2022 CLINICAL DATA:  Staging non-small-cell lung cancer. Last chemotherapy 05/12/2022. * Tracking Code: BO * EXAM: CT CHEST, ABDOMEN, AND PELVIS WITH CONTRAST TECHNIQUE: Multidetector CT imaging of the chest, abdomen and pelvis was performed following the standard protocol during bolus administration of intravenous contrast. RADIATION DOSE REDUCTION: This exam was performed according to the departmental dose-optimization program which includes automated exposure control, adjustment of the mA and/or kV  according to patient size and/or use of iterative  reconstruction technique. CONTRAST:  OMNIPAQUE IOHEXOL 300 MG/ML  SOLN COMPARISON:  CT 03/29/2022 FINDINGS: CT CHEST FINDINGS Cardiovascular: Status post median sternotomy. Heart is nonenlarged. Small pericardial effusion. Scattered vascular calcifications are seen including the thoracic aorta and coronary arteries. Normal caliber thoracic aorta. Left-sided chest port again noted but incompletely included in the imaging field. Mediastinum/Nodes: No specific abnormal lymph node enlargement identified in the axillary region, hilum or mediastinum. Normal caliber thoracic esophagus. Small cyst along the right side of the thyroid, unchanged from previous. No specific imaging follow-up based on appearance. Lungs/Pleura: Lungs are without consolidation, pneumothorax or effusion. There is some dependent atelectasis noted bilaterally. Few subpleural blebs and emphysematous changes identified in the upper lung zones. The previous spiculated nodule in the right upper lobe is again noted. Previously this measured 18 x 8 mm and today when measured in the same fashion for continuity measures 18 by 9 mm on series 4, image 43. No new dominant lung nodule. Musculoskeletal: Mild degenerative changes along the spine. Old healed left-sided rib fracture. CT ABDOMEN PELVIS FINDINGS Hepatobiliary: Once again benign lesions are seen in the liver involving segment 1 and 7 near the IVC. Previous lesions on the prior PET-CT scan of 08/28/2021 are not seen on the current examination. Only minimal area of capsular retraction the left lobe lateral segment. No new space-occupying liver lesion. Patent portal vein. Gallbladder is present. Pancreas: Unremarkable. No pancreatic ductal dilatation or surrounding inflammatory changes. Spleen: Normal in size without focal abnormality. Adrenals/Urinary Tract: Preserved left adrenal gland. Right-sided adrenal nodule once again identified also  demonstrating long-term stability measuring 2.6 by 1.9 cm on series 2, image 59. Again favor a benign lesion. No enhancing renal mass or collecting system dilatation. Preserved contours of the urinary bladder. The bladder wall slightly thickened, unchanged from previous. Stomach/Bowel: Large bowel has a normal course and caliber with diffuse colonic stool. Overall moderate stool burden. Normal appendix. Stomach and small bowel are nondilated. There appear to be areas of mild wall thickening along loops of small bowel. Please correlate with any history. Vascular/Lymphatic: Normal caliber aorta and IVC with scattered vascular calcifications. No discrete abnormal lymph node enlargement identified in the abdomen and pelvis. Reproductive: Uterus and bilateral adnexa are unremarkable. Other: Anasarca is increased from previous. Mesenteric haziness and mild ascites identified, similar to previous. Musculoskeletal: Scattered sclerotic bone lesions identified along the spine and pelvis. Distribution similar based on CT. If needed a follow up bone scan as clinically appropriate. IMPRESSION: Stable right upper lobe spiculated lung nodule. No developing new lung mass or lymph node enlargement in the thorax. Stable benign-appearing lesions in the liver, right adrenal gland. No new or recurrent liver mass. No developing lymph node enlargement. Increasing anasarca. Stable mesenteric stranding and trace ascites. There appears to be some mild fold thickening along loops of small bowel without obstruction or dilatation. Please correlate with any particular symptoms. Scattered areas of bony sclerosis, similar distribution by CT. If needed additional workup as clinically appropriate Electronically Signed   By: Karen Kays M.D.   On: 05/31/2022 11:06   CT Abdomen Pelvis W Contrast  Result Date: 05/31/2022 CLINICAL DATA:  Staging non-small-cell lung cancer. Last chemotherapy 05/12/2022. * Tracking Code: BO * EXAM: CT CHEST, ABDOMEN,  AND PELVIS WITH CONTRAST TECHNIQUE: Multidetector CT imaging of the chest, abdomen and pelvis was performed following the standard protocol during bolus administration of intravenous contrast. RADIATION DOSE REDUCTION: This exam was performed according to the departmental dose-optimization program which includes automated exposure control,  adjustment of the mA and/or kV according to patient size and/or use of iterative reconstruction technique. CONTRAST:  OMNIPAQUE IOHEXOL 300 MG/ML  SOLN COMPARISON:  CT 03/29/2022 FINDINGS: CT CHEST FINDINGS Cardiovascular: Status post median sternotomy. Heart is nonenlarged. Small pericardial effusion. Scattered vascular calcifications are seen including the thoracic aorta and coronary arteries. Normal caliber thoracic aorta. Left-sided chest port again noted but incompletely included in the imaging field. Mediastinum/Nodes: No specific abnormal lymph node enlargement identified in the axillary region, hilum or mediastinum. Normal caliber thoracic esophagus. Small cyst along the right side of the thyroid, unchanged from previous. No specific imaging follow-up based on appearance. Lungs/Pleura: Lungs are without consolidation, pneumothorax or effusion. There is some dependent atelectasis noted bilaterally. Few subpleural blebs and emphysematous changes identified in the upper lung zones. The previous spiculated nodule in the right upper lobe is again noted. Previously this measured 18 x 8 mm and today when measured in the same fashion for continuity measures 18 by 9 mm on series 4, image 43. No new dominant lung nodule. Musculoskeletal: Mild degenerative changes along the spine. Old healed left-sided rib fracture. CT ABDOMEN PELVIS FINDINGS Hepatobiliary: Once again benign lesions are seen in the liver involving segment 1 and 7 near the IVC. Previous lesions on the prior PET-CT scan of 08/28/2021 are not seen on the current examination. Only minimal area of capsular  retraction the left lobe lateral segment. No new space-occupying liver lesion. Patent portal vein. Gallbladder is present. Pancreas: Unremarkable. No pancreatic ductal dilatation or surrounding inflammatory changes. Spleen: Normal in size without focal abnormality. Adrenals/Urinary Tract: Preserved left adrenal gland. Right-sided adrenal nodule once again identified also demonstrating long-term stability measuring 2.6 by 1.9 cm on series 2, image 59. Again favor a benign lesion. No enhancing renal mass or collecting system dilatation. Preserved contours of the urinary bladder. The bladder wall slightly thickened, unchanged from previous. Stomach/Bowel: Large bowel has a normal course and caliber with diffuse colonic stool. Overall moderate stool burden. Normal appendix. Stomach and small bowel are nondilated. There appear to be areas of mild wall thickening along loops of small bowel. Please correlate with any history. Vascular/Lymphatic: Normal caliber aorta and IVC with scattered vascular calcifications. No discrete abnormal lymph node enlargement identified in the abdomen and pelvis. Reproductive: Uterus and bilateral adnexa are unremarkable. Other: Anasarca is increased from previous. Mesenteric haziness and mild ascites identified, similar to previous. Musculoskeletal: Scattered sclerotic bone lesions identified along the spine and pelvis. Distribution similar based on CT. If needed a follow up bone scan as clinically appropriate. IMPRESSION: Stable right upper lobe spiculated lung nodule. No developing new lung mass or lymph node enlargement in the thorax. Stable benign-appearing lesions in the liver, right adrenal gland. No new or recurrent liver mass. No developing lymph node enlargement. Increasing anasarca. Stable mesenteric stranding and trace ascites. There appears to be some mild fold thickening along loops of small bowel without obstruction or dilatation. Please correlate with any particular symptoms.  Scattered areas of bony sclerosis, similar distribution by CT. If needed additional workup as clinically appropriate Electronically Signed   By: Karen Kays M.D.   On: 05/31/2022 11:06    ASSESSMENT AND PLAN: This is a very pleasant 57 years old African-American female recently diagnosed with a stage IV (T2 a, N2, M1 C) non-small cell lung cancer, adenocarcinoma presented with right upper lobe lung mass in addition to right hilar and mediastinal lymphadenopathy as well as innumerable bilateral pulmonary nodules and bone and liver metastasis diagnosed  in August 2023 with positive KRAS G12C mutation and PD-L1 expression of 89%. She is currently undergoing systemic chemotherapy with carboplatin for AUC of 5, Alimta 500 Mg/M2 and Keytruda 200 Mg IV every 3 weeks status post 13 cycles.  She has been tolerating her treatment well with no concerning adverse effects. She had repeat CT scan of the chest, abdomen and pelvis performed recently.  I personally and independently reviewed the scan and discussed results with the patient today. Her scan showed no concerning findings for disease recurrence or metastasis but she continues to have stable mesenteric stranding and trace ascites with some mild fold thickening along lobes of the small bowel without obstruction or dilatation. I recommended for the patient to continue her current treatment with maintenance Alimta and Keytruda and she will proceed with cycle #14 today. She will come back for follow-up visit in 3 weeks for evaluation before the next cycle of her treatment. For the gastrointestinal symptoms, she has an appointment with [facility] gastroenterology on June 23, 2022. For the dyspepsia and epigastric pain, she will continue her current treatment with Protonix. For the pain management she is followed by the palliative care team and currently on Xtampza ER 13.5 mg every 12 hours in addition to Oxy IR for breakthrough pain. The patient was advised to call  immediately if she has any concerning symptoms in the interval. The patient voices understanding of current disease status and treatment options and is in agreement with the current care plan.  All questions were answered. The patient knows to call the clinic with any problems, questions or concerns. We can certainly see the patient much sooner if necessary. The total time spent in the appointment was 20 minutes.  Disclaimer: This note was dictated with voice recognition software. Similar sounding words can inadvertently be transcribed and may not be corrected upon review.

## 2022-06-15 ENCOUNTER — Other Ambulatory Visit: Payer: Self-pay | Admitting: Physician Assistant

## 2022-06-16 NOTE — Progress Notes (Signed)
NEUROLOGY FOLLOW UP OFFICE NOTE  Cassidy [name] [id number]  Subjective:  Cassidy [name] is a 57 y.o. 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  To briefly review: Patient had a triple bypass surgery in 06/2021. She was having ongoing chest pain. She was later found to have stage IV lung cancer. She has done radiation and chemotherapy.   She had a history of diabetic neuropathy prior to chemotherapy. Symptoms have worsened with chemotherapy. She describes burning pain into both legs up to her knees. She has burning in her fingers as well. She thinks the left leg may be more weak. She gets dizzy and off balance. She has been given a walker, which she just started using. She denies recent falls. She also has bone pain in hips and back due to bone mets. She takes voltaren gel, oxycodone, and Xtampza for pain.    She was previously on gabapentin 600 mg BID. She is not sure there was a great difference. She has been off of this about 1 year ago.   She had some migraines after chemotherapy, but this has improved.   Per most recent oncology note from 03/31/22 by Dr. [name]: ASSESSMENT AND PLAN: This is a very pleasant 57 years old African-American female recently diagnosed with a stage IV (T2 a, N2, M1 C) non-small cell lung cancer, adenocarcinoma presented with right upper lobe lung mass in addition to right hilar and mediastinal lymphadenopathy as well as innumerable bilateral pulmonary nodules and bone and liver metastasis diagnosed in August 2023 with positive KRAS G12C mutation and PD-L1 expression of 89%. She is currently undergoing systemic chemotherapy with carboplatin for AUC of 5, Alimta 500 Mg/M2 and Keytruda 200 Mg IV every 3 weeks status post 10 cycles.  The patient has been tolerating this  treatment fairly well except for the frequent nausea and vomiting.  She is currently on Zofran ODT with some improvement.  She also has early satiety and interested in seeing a gastroenterologist for further evaluation of her condition.  I will make referral to gastroenterology. She had repeat CT scan of the chest, abdomen and pelvis performed recently.  I personally and independently reviewed the scan and discussed the result with the patient today. Her scan showed no concerning findings for disease progression but there was increasing mesenteric stranding and trace pelvic free fluid with areas of wall thickening along the small bowel loops which are not dilated. I recommended for the patient to continue her current treatment with maintenance Alimta and Keytruda and she will proceed with cycle #11 today. For the dyspepsia and epigastric pain, she will continue her current treatment with Protonix. For the pain management she is followed by the palliative care team and currently on Xtampza ER 13.5 mg every 12 hours in addition to Oxy IR for breakthrough pain. The patient will come back for follow-up visit in 3 weeks for evaluation before starting cycle #12.   Of note, patient is on zoloft and buspar for mood. These medications are working well. This is managed by PCP, Dr. Melba Coon.  Most recent Assessment and Plan (04/07/22): Her neurological examination is pertinent for mildly diminished sensation in feet. Available diagnostic data is significant for most recent HbA1c of 6.2. Patient's symptoms sound consistent with peripheral neuropathy, which she has had for many years  likely due to DM. It has worsened with chemotherapy. 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 I will look for other treatable causes of neuropathy as below and attempt to manage symptoms.    PLAN: -Blood work: B1, B12, IFE -Have messaged patient's PCP Dr. Melba Coon to discuss the possibility of  switching zoloft to cymbalta. I do not want to affect mood symptoms, but Cymbalta may be a good option to manage both as Cymbalta has the best data for chemotherapy induced neuropathy -Patient would prefer not to take gabapentin -Lidocaine cream PRN  Since their last visit: After discussion with Dr. Melba Coon, the plan was to wean off Zoloft and start Cymbalta. Patient preferred to wean slowly so that she could use up the Zoloft she already had from the pharmacy. Patient is off of the Zoloft, but has not been able to get the Cymbalta. This had not been ordered because I was waiting to hear from the patient about being off Zoloft.  Symptoms in her legs are about the same. She has needle sensations in feet and legs.  Patient continues to get chemo. She has occasional migraines. This is one of the side effects per patient. She uses Excedrin with relief of symptoms.  Patient also mentions swelling of face and legs. She is on Lasix for this as needed.   MEDICATIONS:  Outpatient Encounter Medications as of 06/23/2022  Medication Sig   aspirin EC 81 MG tablet Take 81 mg by mouth daily.   atorvastatin (LIPITOR) 40 MG tablet TAKE 1 TABLET (40 MG TOTAL) BY MOUTH DAILY.   busPIRone (BUSPAR) 15 MG tablet Take 0.5 tablets (7.5 mg total) by mouth 2 (two) times daily.   Carboxymethylcellul-Glycerin (LUBRICATING EYE DROPS OP) Place 1 drop into both eyes daily as needed (dry eyes).   carvedilol (COREG) 12.5 MG tablet TAKE 1 TABLET (12.5 MG TOTAL) BY MOUTH 2 (TWO) TIMES DAILY WITH A MEAL.   diclofenac Sodium (VOLTAREN) 1 % GEL Apply 1 application topically 3 times daily as needed for pain   DULoxetine (CYMBALTA) 30 MG capsule Take 1 capsule (30 mg total) by mouth daily.   ferrous sulfate 325 (65 FE) MG tablet Take 325 mg by mouth daily with breakfast.   fluticasone (FLONASE) 50 MCG/ACT nasal spray PLACE 1 SPRAY INTO BOTH NOSTRILS DAILY AS NEEDED (CONGESTION).   fluticasone-salmeterol (ADVAIR) 100-50 MCG/ACT  AEPB Inhale 1 puff into the lungs 2 (two) times daily.   folic acid (FOLVITE) 1 MG tablet Take 1 tablet (1 mg total) by mouth daily.   furosemide (LASIX) 40 MG tablet Take 1 tablet (40 mg total) by mouth daily as needed. If you gain 3 lbs in 24 hours or 5 lbs in 1 week   glucose blood (ACCU-CHEK AVIVA PLUS) test strip Use as instructed   hyoscyamine (LEVSIN SL) 0.125 MG SL tablet Place 1 tablet (0.125 mg total) under the tongue every 6 (six) hours as needed. For abdominal cramping   insulin glargine (LANTUS) 100 UNIT/ML Solostar Pen Inject 24 Units into the skin every morning.   Insulin Pen Needle 32G X 4 MM MISC 1 Needle by Does not apply route in the morning and at bedtime.   ipratropium (ATROVENT HFA) 17 MCG/ACT inhaler Inhale 2 puffs into the lungs every 4 (four) hours as needed for wheezing (COPD).   lactulose (CHRONULAC) 10 GM/15ML solution Take 30 mLs (20 g total) by mouth 2 (two) times daily as needed for mild constipation, moderate constipation or severe  constipation.   lidocaine-prilocaine (EMLA) cream APPLY 1 APPLICATION TOPICALLY AS NEEDED.   LORazepam (ATIVAN) 0.5 MG tablet Take 1 tablet (0.5 mg total) by mouth every 8 (eight) hours as needed for anxiety (nausea).   metoCLOPramide (REGLAN) 10 MG tablet Take 1 tablet (10 mg total) by mouth 3 (three) times daily before meals.   nicotine polacrilex (NICORETTE) 2 MG gum Take 1 each (2 mg total) by mouth as needed for smoking cessation.   nitroGLYCERIN (NITROSTAT) 0.4 MG SL tablet Place 1 tablet (0.4 mg total) under the tongue every 5 (five) minutes as needed for chest pain.   nystatin (MYCOSTATIN) 100000 UNIT/ML suspension Take 5 mLs (500,000 Units total) by mouth 4 (four) times daily.   ondansetron (ZOFRAN-ODT) 8 MG disintegrating tablet Take 1 tablet (8 mg total) by mouth every 8 (eight) hours as needed for nausea or vomiting.   [START ON 06/25/2022] oxyCODONE (OXY IR/ROXICODONE) 5 MG immediate release tablet Take 1-2 tablets (5-10 mg  total) by mouth every 4 (four) hours as needed for severe pain.   oxyCODONE ER (XTAMPZA ER) 13.5 MG C12A Take 13.5 mg by mouth every 12 (twelve) hours.   pantoprazole (PROTONIX) 40 MG tablet Take 1 tablet (40 mg total) by mouth 2 (two) times daily.   polyethylene glycol powder (GAVILAX) 17 GM/SCOOP powder TAKE ONE CAPFUL (17 GRAMS )BY MOUTH DAILY AS NEEDED FOR MILD OR MODERATE CONSTIPATION.   potassium chloride SA (KLOR-CON M) 20 MEQ tablet Take 1 tablet (20 mEq total) by mouth daily as needed. On days you take Lasix   prochlorperazine (COMPAZINE) 10 MG tablet TAKE 1 TABLET (10 MG TOTAL) BY MOUTH EVERY 6 (SIX) HOURS AS NEEDED FOR NAUSEA OR VOMITING.   valACYclovir (VALTREX) 1000 MG tablet TAKE FOR 3 DAYS AS NEEDED FOR EACH OUTBREAK   vitamin B-12 (CYANOCOBALAMIN) 1000 MCG tablet Take 1,000 mcg by mouth daily.   [DISCONTINUED] oxyCODONE (OXY IR/ROXICODONE) 5 MG immediate release tablet Take 1-2 tablets (5-10 mg total) by mouth every 4 (four) hours as needed for severe pain.   [DISCONTINUED] pantoprazole (PROTONIX) 40 MG tablet TAKE 1 TABLET (40 MG TOTAL) BY MOUTH DAILY.   No facility-administered encounter medications on file as of 06/23/2022.    PAST MEDICAL HISTORY: Past Medical History:  Diagnosis Date   Adenocarcinoma of right lung, stage 4 (HCC) 08/26/2021   Anemia    has sickle cell trait   Atherosclerotic heart disease of native coronary artery with angina pectoris (HCC) 05/2012, 10/2013   a.  s/p PCTA to dRCA and ostial RPAV-PLA vessel + PDA branch (05/2012)  b. [place] s/p DES to mLAD - Resolute DES 3.0 x 22 (3.20mm -->3.3 mm)   Bipolar depression (HCC)    Breast abscess    a. right side.    COPD (chronic obstructive pulmonary disease) (HCC)    Depression    Diabetic peripheral neuropathy (HCC)    Fibromyalgia    Genital warts    GERD (gastroesophageal reflux disease)    History of hiatal hernia    HLD (hyperlipidemia)    Hypertension    Lung cancer (HCC)    Pain in limb    a. LE  VENOUS DUPLEX, 02/05/2009 - no evidence of deep vein thrombosis, Baker's cyst   Pneumonia    PTSD (post-traumatic stress disorder)    Sickle cell trait (HCC)    Tobacco abuse    Tooth caries    Type II diabetes mellitus (HCC)     PAST SURGICAL HISTORY: Past Surgical History:  Procedure Laterality Date   BLADDER SURGERY  1980   "TVT"   BLADDER SURGERY  2003   "TVT"   BREAST EXCISIONAL BIOPSY     BREAST SURGERY Right    I&D for multiple abscesses   CARDIAC CATHETERIZATION     CORONARY ARTERY BYPASS GRAFT N/A 06/30/2021   Procedure: CORONARY ARTERY BYPASS GRAFTING (CABG) x3 USING LEFT INTERNAL MAMMARY ARTERY,  RIGHT RADIAL ARTERY AND LEFT GREATER SAPHENOUS VEIN;  Surgeon: Corliss Skains, MD;  Location: [facility] OR;  Service: Open Heart Surgery;  Laterality: N/A;   cutting balloon     ENDOVEIN HARVEST OF GREATER SAPHENOUS VEIN Left 06/30/2021   Procedure: ENDOVEIN HARVEST OF GREATER SAPHENOUS VEIN;  Surgeon: Corliss Skains, MD;  Location: [facility] OR;  Service: Open Heart Surgery;  Laterality: Left;   EYE SURGERY Left    laser surgery   FINE NEEDLE ASPIRATION  08/21/2021   Procedure: FINE NEEDLE ASPIRATION (FNA) LINEAR;  Surgeon: Josephine Igo, DO;  Location: [facility] END[facility];  Service: Pulmonary;;   INCISE AND DRAIN ABCESS  ; 04/26/11; 08/13/11   right breast   INCISION AND DRAINAGE ABSCESS Right 05/09/2012   Procedure: INCISION AND DRAINAGE RIGHT BREAST ABSCESS;  Surgeon: Wilmon Arms. Corliss Skains, MD;  Location: [facility] OR;  Service: General;  Laterality: Right;   INCISION AND DRAINAGE ABSCESS Right 02/08/2014   Procedure: INCISION AND DRAINAGE RIGHT BREAST ABSCESS;  Surgeon: Manus Rudd, MD;  Location: [facility];  Service: General;  Laterality: Right;   INCISION AND DRAINAGE ABSCESS Right 06/14/2014   Procedure: INCISION AND DRAINAGE RIGHT BREAST ABSCESS;  Surgeon: Manus Rudd, MD;  Location: [facility];  Service: General;  Laterality: Right;   IR IMAGING GUIDED PORT INSERTION  12/08/2021   IRRIGATION AND  DEBRIDEMENT ABSCESS  12/19/2010   Procedure: IRRIGATION AND DEBRIDEMENT ABSCESS;  Surgeon: Emelia Loron, MD;  Location: [facility];  Service: General;  Laterality: Right;   IRRIGATION AND DEBRIDEMENT ABSCESS  08/13/2011   Procedure: MINOR INCISION AND DRAINAGE OF ABSCESS;  Surgeon: Cherylynn Ridges, MD;  Location: [facility];  Service: General;  Laterality: Right;  Right Breast    IRRIGATION AND DEBRIDEMENT ABSCESS  11/17/2011   Procedure: IRRIGATION AND DEBRIDEMENT ABSCESS;  Surgeon: Wilmon Arms. Corliss Skains, MD;  Location: [facility];  Service: General;  Laterality: Right;  irrigation and debridement right recurrent breast abscess   LARYNX SURGERY     LEFT HEART CATH AND CORONARY ANGIOGRAPHY N/A 04/09/2021   Procedure: LEFT HEART CATH AND CORONARY ANGIOGRAPHY;  Surgeon: Corky Crafts, MD;  Location: [facility] INVASIVE CV LAB;  Service: Cardiovascular;  Laterality: N/A;   LEFT HEART CATHETERIZATION WITH CORONARY ANGIOGRAM N/A 05/29/2012   Procedure: LEFT HEART CATHETERIZATION WITH CORONARY ANGIOGRAM & PTCA;  Surgeon: Lennette Bihari, MD;  Location: [facility] CATH LAB;  Service: Cardiovascular; Bifurcation dRCA-RPAD/PLA & rPDA  PTCA.   LEFT HEART CATHETERIZATION WITH CORONARY ANGIOGRAM N/A 11/08/2013   Procedure: LEFT HEART CATHETERIZATION WITH CORONARY ANGIOGRAM and Coronary Stent Intervention;  Surgeon: Marykay Lex, MD;  Location: [facility] CATH LAB;  Service: Cardiovascular; mLAD 80% (FFR 0.73) - resolute DES 3.0 x 22 mm (postdilated to 3.3 mm->3.5 mm)   NM MYOVIEW LTD  01/26/2009   Normal study, no evidence of ischemia, EF 67%   ployp removed from voice box 03/30/12     RADIAL ARTERY HARVEST Right 06/30/2021   Procedure: RADIAL ARTERY HARVEST;  Surgeon: Corliss Skains, MD;  Location: [facility];  Service: Open Heart Surgery;  Laterality: Right;  REFRACTIVE SURGERY  ~ 2010   right   TEE WITHOUT CARDIOVERSION N/A 06/30/2021   Procedure: TRANSESOPHAGEAL ECHOCARDIOGRAM (TEE);  Surgeon: Corliss Skains, MD;  Location[facility]  OR;  Service: Open Heart Surgery;  Laterality: N/A;   TONSILLECTOMY  1988   VIDEO BRONCHOSCOPY WITH ENDOBRONCHIAL ULTRASOUND Bilateral 08/21/2021   Procedure: VIDEO BRONCHOSCOPY WITH ENDOBRONCHIAL ULTRASOUND;  Surgeon: Josephine Igo, DO;  Location: [facility] ENDOS[facility];  Service: Pulmonary;  Laterality: Bilateral;    ALLERGIES: Allergies  Allergen Reactions   Amoxil [Amoxicillin] Hives    Has patient had a PCN reaction causing immediate rash, facial/tongue/throat swelling, SOB or lightheadedness with hypotension: Yes Has patient had a PCN reaction causing severe rash involving mucus membranes or skin necrosis: No Has patient had a PCN reaction that required hospitalization No Has patient had a PCN reaction occurring within the last 10 years: Yes If all of the above answers are "NO", then may proceed with Cephalosporin use.   Chantix [Varenicline] Other (See Comments)    Nightmares Hallucinations     Suboxone [Buprenorphine Hcl-Naloxone Hcl] Nausea And Vomiting   Emend [Fosaprepitant Dimeglumine] Itching    See notes for transfusion on 8/23. Pt reported new onset of itching after Emend infusion was started. 25mg  Benadryl IV given per MD. Pt tolerated remaining infusion well.     FAMILY HISTORY: Family History  Problem Relation Age of Onset   Esophageal cancer Mother    COPD Mother    Cancer Mother        lymphoma   Heart disease Mother    Stomach cancer Mother    Hyperlipidemia Father    Heart disease Father    Cancer Maternal Aunt        breast, colon   Crohn's disease Maternal Aunt    Hypertension Maternal Grandmother    Diabetes Maternal Grandmother    Hypertension Brother    Rectal cancer Neg Hx    Liver cancer Neg Hx    Colon cancer Neg Hx    Breast cancer Neg Hx     SOCIAL HISTORY: Social History   Tobacco Use   Smoking status: Some Days    Packs/day: 0.50    Years: 42.00    Additional pack years: 0.00    Total pack years: 21.00    Types: Cigarettes    Start  date: 01/12/1979   Smokeless tobacco: Never   Tobacco comments:    08/14/11 "quit for 3 wk 05/2011; was smoking 1 ppd since age 45 before I quit; at least I've cut down to 1/4 ppd",  Smoking 3 per day 08/20/2021. 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      Objective:  Vital Signs:  BP 116/72 (BP Location: Left Arm, Patient Position: Sitting, Cuff Size: Normal)   Pulse 77   Ht 5' 7.5" (1.715 m)   Wt 191 lb (86.6 kg)   LMP  06/12/2016 (Approximate)   SpO2 96%   BMI 29.47 kg/m   General: No acute distress.  Patient appears well-groomed.   Head:  Normocephalic/atraumatic Neck: supple Extremities: Peripheral edema in bilateral lower extremities  Neurological Exam: Mental status: alert and oriented, speech fluent and not dysarthric, language intact.  Cranial nerves: CN I: not tested CN II: pupils equal, round and reactive to light, visual fields intact CN III, IV, VI:  full range of motion, no nystagmus, no ptosis CN V: facial sensation intact. CN VII: upper and lower face symmetric CN VIII: hearing intact CN IX, X: uvula midline CN XI: sternocleidomastoid and trapezius muscles intact CN XII: tongue midline  Bulk & Tone: normal. Motor:  muscle strength 5/5 throughout Deep Tendon Reflexes:  2+ throughout,  toes downgoing.   Sensation:  Pinprick and vibratory sensation diminished in bilateral lower extremities Finger to nose testing:   Without dysmetria.   Gait:  Normal station and stride.  Romberg negative.   Labs and Imaging review: New results: 04/07/22: IFE: no M protein B12: 822 B1: not able to be collected  04/21/22: CMP unremarkable TSH wnl  06/01/22: CBC significant for Hb 9.4, MCV 103.4  Previously reviewed results: Lab Results  Component Value Date    HGBA1C 6.2 02/02/2022      Recent Labs  No results found for: "VITAMINB12"   Recent Labs       Lab Results  Component Value Date    TSH 2.814 02/17/2022      Recent Labs[] Expand by Default       Lab Results  Component Value Date    ESRSEDRATE 24 08/31/2021      CMP (03/31/22): significant for glucose of 177 CBC (03/30/32): significant for Hb of 10.1 with MCV of 99.7 HbA1c (10/18/15): 7.7   Imaging: MRI brain w/wo contrast (01/26/22): FINDINGS: Brain: No abnormal enhancement. The suspicious area lateral to the frontal horn of the left lateral ventricle is not seen today.   Area of diffusion hyperintensity in the subcortical anterior right frontal lobe is persistent and from shine through, this area is dark on postcontrast assessment.   No incidental infarct, hemorrhage, hydrocephalus, or collection. 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 The 2 smaller are no longer seen and the midline lesion overlying the superior sagittal sinus is regressed to a 7 mm area of enhancement that is not directly contiguous with a vessel.   Sinuses/Orbits: Negative   IMPRESSION: 1. No intracranial enhancement to suggest metastatic disease, including at the left frontal white matter site highlighted previously. 2. Notable regression of calvarial metastases.   MRI lumbar spine wo contrast (07/23/14): IMPRESSION: Mild lumbar degenerative change without significant stenosis or neural  impingement.  Assessment/Plan:  This is Cassidy [name], a 57 y.o. female with neuropathic pain in bilateral lower extremities. Her symptoms are most consistent with neuropathy with risk factors including diabetes and prior exposure to cisplatin chemotherapy. She is currently not on any treatment for neuropathy as there was a miscommunication on when she had stopped Zoloft so we could switch to Cymbalta. Patient's peripheral edema may also be contributing to leg discomfort.  Plan: -Start Cymbalta 30 mg qhs. Patient to call with problems with the medication or if dose if not affective after a few weeks. -Lidocaine cream PRN -Blood work: B1 -Continue B12 and folate supplementation -Discuss leg swelling with PCP  Return  to clinic in 1 year or sooner if needed   Jacquelyne Balint, MD

## 2022-06-22 ENCOUNTER — Encounter: Payer: Self-pay | Admitting: Nurse Practitioner

## 2022-06-22 ENCOUNTER — Inpatient Hospital Stay: Payer: Medicaid Other | Attending: Internal Medicine

## 2022-06-22 ENCOUNTER — Other Ambulatory Visit: Payer: Self-pay

## 2022-06-22 ENCOUNTER — Inpatient Hospital Stay (HOSPITAL_BASED_OUTPATIENT_CLINIC_OR_DEPARTMENT_OTHER): Payer: Medicaid Other | Admitting: Internal Medicine

## 2022-06-22 ENCOUNTER — Inpatient Hospital Stay: Payer: Medicaid Other

## 2022-06-22 ENCOUNTER — Inpatient Hospital Stay (HOSPITAL_BASED_OUTPATIENT_CLINIC_OR_DEPARTMENT_OTHER): Payer: Medicaid Other | Admitting: Nurse Practitioner

## 2022-06-22 VITALS — BP 104/71 | HR 63 | Temp 97.7°F | Resp 17 | Wt 192.2 lb

## 2022-06-22 DIAGNOSIS — Z515 Encounter for palliative care: Secondary | ICD-10-CM

## 2022-06-22 DIAGNOSIS — I10 Essential (primary) hypertension: Secondary | ICD-10-CM | POA: Diagnosis not present

## 2022-06-22 DIAGNOSIS — C3491 Malignant neoplasm of unspecified part of right bronchus or lung: Secondary | ICD-10-CM

## 2022-06-22 DIAGNOSIS — C3411 Malignant neoplasm of upper lobe, right bronchus or lung: Secondary | ICD-10-CM | POA: Diagnosis present

## 2022-06-22 DIAGNOSIS — C7951 Secondary malignant neoplasm of bone: Secondary | ICD-10-CM | POA: Insufficient documentation

## 2022-06-22 DIAGNOSIS — E041 Nontoxic single thyroid nodule: Secondary | ICD-10-CM | POA: Insufficient documentation

## 2022-06-22 DIAGNOSIS — C787 Secondary malignant neoplasm of liver and intrahepatic bile duct: Secondary | ICD-10-CM | POA: Diagnosis not present

## 2022-06-22 DIAGNOSIS — G893 Neoplasm related pain (acute) (chronic): Secondary | ICD-10-CM | POA: Diagnosis not present

## 2022-06-22 DIAGNOSIS — R220 Localized swelling, mass and lump, head: Secondary | ICD-10-CM | POA: Diagnosis not present

## 2022-06-22 DIAGNOSIS — I3139 Other pericardial effusion (noninflammatory): Secondary | ICD-10-CM | POA: Insufficient documentation

## 2022-06-22 DIAGNOSIS — E785 Hyperlipidemia, unspecified: Secondary | ICD-10-CM | POA: Insufficient documentation

## 2022-06-22 DIAGNOSIS — K5903 Drug induced constipation: Secondary | ICD-10-CM

## 2022-06-22 DIAGNOSIS — Z5112 Encounter for antineoplastic immunotherapy: Secondary | ICD-10-CM | POA: Diagnosis present

## 2022-06-22 DIAGNOSIS — M7989 Other specified soft tissue disorders: Secondary | ICD-10-CM | POA: Diagnosis not present

## 2022-06-22 DIAGNOSIS — R5383 Other fatigue: Secondary | ICD-10-CM | POA: Diagnosis not present

## 2022-06-22 DIAGNOSIS — R530 Neoplastic (malignant) related fatigue: Secondary | ICD-10-CM

## 2022-06-22 DIAGNOSIS — Z95828 Presence of other vascular implants and grafts: Secondary | ICD-10-CM

## 2022-06-22 DIAGNOSIS — Z79899 Other long term (current) drug therapy: Secondary | ICD-10-CM | POA: Insufficient documentation

## 2022-06-22 DIAGNOSIS — Z5111 Encounter for antineoplastic chemotherapy: Secondary | ICD-10-CM | POA: Insufficient documentation

## 2022-06-22 DIAGNOSIS — E119 Type 2 diabetes mellitus without complications: Secondary | ICD-10-CM | POA: Insufficient documentation

## 2022-06-22 DIAGNOSIS — R188 Other ascites: Secondary | ICD-10-CM | POA: Insufficient documentation

## 2022-06-22 DIAGNOSIS — Z880 Allergy status to penicillin: Secondary | ICD-10-CM | POA: Diagnosis not present

## 2022-06-22 LAB — CMP (CANCER CENTER ONLY)
ALT: 20 U/L (ref 0–44)
AST: 28 U/L (ref 15–41)
Albumin: 3.6 g/dL (ref 3.5–5.0)
Alkaline Phosphatase: 63 U/L (ref 38–126)
Anion gap: 5 (ref 5–15)
BUN: 12 mg/dL (ref 6–20)
CO2: 28 mmol/L (ref 22–32)
Calcium: 9.1 mg/dL (ref 8.9–10.3)
Chloride: 107 mmol/L (ref 98–111)
Creatinine: 1.12 mg/dL — ABNORMAL HIGH (ref 0.44–1.00)
GFR, Estimated: 57 mL/min — ABNORMAL LOW (ref >60)
Glucose, Bld: 118 mg/dL — ABNORMAL HIGH (ref 70–99)
Potassium: 3.7 mmol/L (ref 3.5–5.1)
Sodium: 140 mmol/L (ref 135–145)
Total Bilirubin: 0.6 mg/dL (ref 0.3–1.2)
Total Protein: 6.9 g/dL (ref 6.5–8.1)

## 2022-06-22 LAB — CBC WITH DIFFERENTIAL (CANCER CENTER ONLY)
Abs Immature Granulocytes: 0.02 10*3/uL (ref 0.00–0.07)
Basophils Absolute: 0 10*3/uL (ref 0.0–0.1)
Basophils Relative: 1 %
Eosinophils Absolute: 0.1 10*3/uL (ref 0.0–0.5)
Eosinophils Relative: 1 %
HCT: 29 % — ABNORMAL LOW (ref 36.0–46.0)
Hemoglobin: 9.9 g/dL — ABNORMAL LOW (ref 12.0–15.0)
Immature Granulocytes: 0 %
Lymphocytes Relative: 14 %
Lymphs Abs: 0.8 10*3/uL (ref 0.7–4.0)
MCH: 35.2 pg — ABNORMAL HIGH (ref 26.0–34.0)
MCHC: 34.1 g/dL (ref 30.0–36.0)
MCV: 103.2 fL — ABNORMAL HIGH (ref 80.0–100.0)
Monocytes Absolute: 0.8 10*3/uL (ref 0.1–1.0)
Monocytes Relative: 14 %
Neutro Abs: 3.9 10*3/uL (ref 1.7–7.7)
Neutrophils Relative %: 70 %
Platelet Count: 294 10*3/uL (ref 150–400)
RBC: 2.81 MIL/uL — ABNORMAL LOW (ref 3.87–5.11)
RDW: 14.9 % (ref 11.5–15.5)
WBC Count: 5.5 10*3/uL (ref 4.0–10.5)
nRBC: 0 % (ref 0.0–0.2)

## 2022-06-22 LAB — TSH: TSH: 8.68 u[IU]/mL — ABNORMAL HIGH (ref 0.350–4.500)

## 2022-06-22 MED ORDER — SODIUM CHLORIDE 0.9 % IV SOLN: Freq: Once | INTRAVENOUS | Status: AC

## 2022-06-22 MED ORDER — SODIUM CHLORIDE 0.9 % IV SOLN
400.0000 mg/m2 | Freq: Once | INTRAVENOUS | Status: AC
  Administered 2022-06-22: 800 mg via INTRAVENOUS
  Filled 2022-06-22: qty 20

## 2022-06-22 MED ORDER — PROCHLORPERAZINE MALEATE 10 MG PO TABS
10.0000 mg | ORAL_TABLET | Freq: Once | ORAL | Status: AC
  Administered 2022-06-22: 10 mg via ORAL
  Filled 2022-06-22: qty 1

## 2022-06-22 MED ORDER — HEPARIN SOD (PORK) LOCK FLUSH 100 UNIT/ML IV SOLN
500.0000 [IU] | Freq: Once | INTRAVENOUS | Status: AC | PRN
  Administered 2022-06-22: 500 [IU]

## 2022-06-22 MED ORDER — SODIUM CHLORIDE 0.9% FLUSH
10.0000 mL | INTRAVENOUS | Status: DC | PRN
  Administered 2022-06-22: 10 mL

## 2022-06-22 MED ORDER — OXYCODONE HCL 5 MG PO TABS
5.0000 mg | ORAL_TABLET | ORAL | 0 refills | Status: DC | PRN
Start: 2022-06-25 — End: 2022-07-14

## 2022-06-22 MED ORDER — SODIUM CHLORIDE 0.9% FLUSH
10.0000 mL | Freq: Once | INTRAVENOUS | Status: AC
  Administered 2022-06-22: 10 mL

## 2022-06-22 MED ORDER — SODIUM CHLORIDE 0.9 % IV SOLN
200.0000 mg | Freq: Once | INTRAVENOUS | Status: AC
  Administered 2022-06-22: 200 mg via INTRAVENOUS
  Filled 2022-06-22: qty 200

## 2022-06-22 MED ORDER — CYANOCOBALAMIN 1000 MCG/ML IJ SOLN
1000.0000 ug | Freq: Once | INTRAMUSCULAR | Status: AC
  Administered 2022-06-22: 1000 ug via INTRAMUSCULAR
  Filled 2022-06-22: qty 1

## 2022-06-22 NOTE — Progress Notes (Signed)
Palliative Medicine [facility]  [phone] [phone]   Name: Cassidy [name] Date: 06/22/2022 MRN: [id number]  DOB: 03/20/1965  Patient Care Team: [name], DO as PCP - General (Family Medicine) O'Neal, Ronnald Ramp, MD as PCP - Cardiology (Cardiology) Emelia Loron, MD as Consulting Physician (General Surgery)    INTERVAL HISTORY: Cassidy [name] is a 57 y.o. 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  Palliative ask to see for symptom management and goals of care.   SOCIAL HISTORY:    Cassidy [name] reports that she has been smoking cigarettes. She started smoking about 43 years ago. She has a 21.00 pack-year smoking history. She has never used smokeless tobacco. She reports that she does not currently use drugs. She reports that she does not drink alcohol.  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  s/p PCTA to dRCA and ostial RPAV-PLA vessel + PDA branch (05/2012)  b. [place] s/p DES to mLAD - Resolute DES 3.0 x 22 (3.54mm -->3.3 mm)   Bipolar depression (HCC)    Breast abscess    a. right side.    COPD (chronic obstructive pulmonary disease) (HCC)    Depression    Diabetic peripheral neuropathy (HCC)    Fibromyalgia    Genital warts    GERD (gastroesophageal reflux disease)    History of hiatal hernia    HLD (hyperlipidemia)    Hypertension    Pain in limb    a. LE VENOUS DUPLEX, 02/05/2009 - no evidence of deep vein thrombosis, Baker's cyst   Pneumonia    PTSD (post-traumatic  stress disorder)    Sickle cell trait (HCC)    Tobacco abuse    Tooth caries    Type II diabetes mellitus (HCC)     ALLERGIES:  is allergic to amoxil [amoxicillin], chantix [varenicline], suboxone [buprenorphine hcl-naloxone hcl], and emend [fosaprepitant dimeglumine].  MEDICATIONS:  Current Outpatient Medications  Medication Sig Dispense Refill   aspirin EC 81 MG tablet Take 81 mg by mouth daily.     atorvastatin (LIPITOR) 40 MG tablet TAKE 1 TABLET (40 MG TOTAL) BY MOUTH DAILY. 90 tablet 3   busPIRone (BUSPAR) 15 MG tablet Take 0.5 tablets (7.5 mg total) by mouth 2 (two) times daily. 60 tablet 3   Carboxymethylcellul-Glycerin (LUBRICATING EYE DROPS OP) Place 1 drop into both eyes daily as needed (dry eyes).     carvedilol (COREG) 12.5 MG tablet TAKE 1 TABLET (12.5 MG TOTAL) BY MOUTH 2 (TWO) TIMES DAILY WITH A MEAL. (Patient not taking: Reported on 06/01/2022) 180 tablet 1   diclofenac Sodium (VOLTAREN) 1 % GEL Apply 1 application topically 3 times daily as needed for pain 2 g 3   ferrous sulfate 325 (65 FE) MG tablet Take 325 mg by mouth daily with breakfast.     fluticasone (FLONASE) 50 MCG/ACT nasal spray PLACE 1 SPRAY INTO BOTH NOSTRILS DAILY AS NEEDED (CONGESTION). 16 g 1   fluticasone-salmeterol (ADVAIR) 100-50 MCG/ACT AEPB Inhale 1 puff into the lungs 2 (two) times daily. 1 each 3   folic  acid (FOLVITE) 1 MG tablet Take 1 tablet (1 mg total) by mouth daily. 30 tablet 3   furosemide (LASIX) 40 MG tablet Take 1 tablet (40 mg total) by mouth daily as needed. If you gain 3 lbs in 24 hours or 5 lbs in 1 week 30 tablet 1   glucose blood (ACCU-CHEK AVIVA PLUS) test strip Use as instructed 50 each 1   hyoscyamine (LEVSIN SL) 0.125 MG SL tablet Place 1 tablet (0.125 mg total) under the tongue every 6 (six) hours as needed. For abdominal cramping 30 tablet 0   insulin glargine (LANTUS) 100 UNIT/ML Solostar Pen Inject 24 Units into the skin every morning. 3 mL 11   Insulin Pen Needle 32G X 4 MM  MISC 1 Needle by Does not apply route in the morning and at bedtime. 120 each 2   ipratropium (ATROVENT HFA) 17 MCG/ACT inhaler Inhale 2 puffs into the lungs every 4 (four) hours as needed for wheezing (COPD).     lactulose (CHRONULAC) 10 GM/15ML solution Take 30 mLs (20 g total) by mouth 2 (two) times daily as needed for mild constipation, moderate constipation or severe constipation. 236 mL 0   lidocaine-prilocaine (EMLA) cream APPLY 1 APPLICATION TOPICALLY AS NEEDED. 30 g 2   LORazepam (ATIVAN) 0.5 MG tablet Take 1 tablet (0.5 mg total) by mouth every 8 (eight) hours as needed for anxiety (nausea). 30 tablet 0   metoCLOPramide (REGLAN) 10 MG tablet Take 1 tablet (10 mg total) by mouth 3 (three) times daily before meals. 60 tablet 0   nicotine polacrilex (NICORETTE) 2 MG gum Take 1 each (2 mg total) by mouth as needed for smoking cessation. 100 tablet 0   nitroGLYCERIN (NITROSTAT) 0.4 MG SL tablet Place 1 tablet (0.4 mg total) under the tongue every 5 (five) minutes as needed for chest pain. 25 tablet 5   nystatin (MYCOSTATIN) 100000 UNIT/ML suspension Take 5 mLs (500,000 Units total) by mouth 4 (four) times daily. 60 mL 0   ondansetron (ZOFRAN-ODT) 8 MG disintegrating tablet Take 1 tablet (8 mg total) by mouth every 8 (eight) hours as needed for nausea or vomiting. (Patient not taking: Reported on 02/02/2022) 30 tablet 2   oxyCODONE (OXY IR/ROXICODONE) 5 MG immediate release tablet Take 1-2 tablets (5-10 mg total) by mouth every 4 (four) hours as needed for severe pain. 90 tablet 0   oxyCODONE ER (XTAMPZA ER) 13.5 MG C12A Take 13.5 mg by mouth every 12 (twelve) hours. 60 capsule 0   pantoprazole (PROTONIX) 40 MG tablet TAKE 1 TABLET (40 MG TOTAL) BY MOUTH DAILY. 30 tablet 1   polyethylene glycol powder (GAVILAX) 17 GM/SCOOP powder TAKE ONE CAPFUL (17 GRAMS )BY MOUTH DAILY AS NEEDED FOR MILD OR MODERATE CONSTIPATION. 510 g 1   potassium chloride SA (KLOR-CON M) 20 MEQ tablet Take 1 tablet (20 mEq  total) by mouth daily as needed. On days you take Lasix 30 tablet 1   prochlorperazine (COMPAZINE) 10 MG tablet TAKE 1 TABLET (10 MG TOTAL) BY MOUTH EVERY 6 (SIX) HOURS AS NEEDED FOR NAUSEA OR VOMITING. 30 tablet 0   valACYclovir (VALTREX) 1000 MG tablet TAKE FOR 3 DAYS AS NEEDED FOR EACH OUTBREAK 30 tablet 11   vitamin B-12 (CYANOCOBALAMIN) 1000 MCG tablet Take 1,000 mcg by mouth daily.     No current facility-administered medications for this visit.    VITAL SIGNS: LMP 06/12/2016 (Approximate)  There were no vitals filed for this visit.  Estimated body mass index is 30.34 kg/m  as calculated from the following:   Height as of 04/07/22: 5' 7.5" (1.715 m).   Weight as of 06/01/22: 196 lb 9.6 oz (89.2 kg).   PERFORMANCE STATUS (ECOG) : 1 - Symptomatic but completely ambulatory   Physical Exam: General: NAD Pulmonary: normal breathing pattern  Extremities: pedal edema, no joint deformities Skin: no rashes Neurological: alert and oriented x 4, mood and affect appropriate   IMPRESSION: .  I saw Cassidy [name] during infusion. No acute distress. Tolerating treatment without difficulty. She is trying to remain active. Is having ankle edema which is causing her some discomfort. Denies nausea, vomiting, diarrhea.    Neoplasm related pain Cassidy [name] reports that her pain is well controlled on current regimen. She is taking Xtampza 13 mg every 12 hours and Oxycodone 5-10 mg every 4 hours as needed for breakthrough pain. Does not require around the clock.   Plan in place, per Neurology and PCP, to transition patient from Zoloft to Cymbalta. It is felt that this will serve to help both patient's depression and pain.  Is also using topical Voltaren to hip area for aches during the evening and night time hours.   2.  Nausea Controlled on regimen.   3. Constipation  Ongoing constipation however she is trying to control with medication and increase in fiber. Reports daily intake of oatmeal and  supplements. Is having a bowel movement several days a week. We discussed ways to become more regular. She has an upcoming appointment with GI which she is hopeful for further insight.   We discussed the importance of continued conversation with family and their medical providers regarding overall plan of care and treatment options, ensuring decisions are within the context of the patients values and GOCs.  PLAN: Xtampza 13 mg every 12 hours. Oxycodone IR 5-10 mg every 6 hours as needed for breakthrough pain. Senna-S and Miralax on a daily basis for the management of constipation. Zofran, Compazine, and Ativan as needed for nausea Linzess as needed  Ongoing emotional support and symptom management. Palliative will plan to see patient back in 3-4 weeks in collaboration with her oncology appointments.   Patient expressed understanding and was in agreement with this plan. She also understands that She can call the clinic at any time with any questions, concerns, or complaints.    Any controlled substances utilized were prescribed in the context of palliative care. PDMP has been reviewed.   Any controlled substances utilized were prescribed in the context of palliative care. PDMP has been reviewed.    Visit consisted of counseling and education dealing with the complex and emotionally intense issues of symptom management and palliative care in the setting of serious and potentially life-threatening illness.Greater than 50%  of this time was spent counseling and coordinating care related to the above assessment and plan.  Willette Alma, AGPCNP-BC  Palliative Medicine Team/[facility]  *Please note that this is a verbal dictation therefore any spelling or grammatical errors are due to the "[employer]" system interpretation.

## 2022-06-22 NOTE — Progress Notes (Signed)
[facility] [phone]   [phone]  OFFICE PROGRESS NOTE  [name], DO [address] [place] [address]  DIAGNOSIS: stage IV (T2a, N2, M1c) non-small cell lung cancer, adenocarcinoma presented with right upper lobe lung mass in addition to right hilar and mediastinal lymphadenopathy and innumerable bilateral pulmonary nodules as well as liver and bone metastasis diagnosed in August 2023.   Detected Alteration(s) / Biomarker(s) Associated FDA-approved therapies Clinical Trial Availability % cfDNA or Amplification  KRAS G12C  approved by FDA Adagrasib, Sotorasib Yes 5.3%  IDH1 R132L  approved in other indication Ivosidenib, Olutasidenib Yes 1.9%  PD-L1 expression 89%  PRIOR THERAPY: None  CURRENT THERAPY: Systemic chemotherapy with carboplatin for AUC of 5, Alimta 500 Mg/M2 and Keytruda 200 Mg IV every 3 weeks.  First dose September 02, 2021.  Status post 14 cycles.  Starting from cycle #5 she is on maintenance treatment with Alimta and Keytruda every 3 weeks.  INTERVAL HISTORY: Cassidy [name] 57 y.o. female returns to the clinic today for follow-up visit.  The patient is feeling fine today with no concerning complaints except for the facial swelling and tearing as well as the swelling of the lower extremities.  She has no significant chest pain, shortness of breath except with exertion with no cough or hemoptysis.  She has no nausea, vomiting, diarrhea or constipation.  She has no headache or visual changes.  She denied having any significant fever or chills.  She is currently on Lasix and she lost few pounds.  She is here today for evaluation before starting cycle #15.  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  s/p PCTA  to dRCA and ostial RPAV-PLA vessel + PDA branch (05/2012)  b. [place] s/p DES to mLAD - Resolute DES 3.0 x 22 (3.45mm -->3.3 mm)   Bipolar depression (HCC)    Breast abscess    a. right side.    COPD (chronic obstructive pulmonary disease) (HCC)    Depression    Diabetic peripheral neuropathy (HCC)    Fibromyalgia    Genital warts    GERD (gastroesophageal reflux disease)    History of hiatal hernia    HLD (hyperlipidemia)    Hypertension    Pain in limb    a. LE VENOUS DUPLEX, 02/05/2009 - no evidence of deep vein thrombosis, Baker's cyst   Pneumonia    PTSD (post-traumatic stress disorder)    Sickle cell trait (HCC)    Tobacco abuse    Tooth caries    Type II diabetes mellitus (HCC)     ALLERGIES:  is allergic to amoxil [amoxicillin], chantix [varenicline], suboxone [buprenorphine hcl-naloxone hcl], and emend [fosaprepitant dimeglumine].  MEDICATIONS:  Current Outpatient Medications  Medication Sig Dispense Refill   aspirin EC 81 MG tablet Take 81 mg by mouth daily.     atorvastatin (LIPITOR) 40 MG tablet TAKE 1 TABLET (40 MG TOTAL) BY MOUTH DAILY. 90 tablet 3   busPIRone (BUSPAR) 15 MG tablet Take 0.5 tablets (7.5 mg total) by mouth 2 (two) times daily. 60 tablet 3   Carboxymethylcellul-Glycerin (LUBRICATING EYE DROPS OP) Place 1 drop into both eyes daily as needed (dry eyes).     carvedilol (COREG) 12.5 MG tablet TAKE 1 TABLET (  12.5 MG TOTAL) BY MOUTH 2 (TWO) TIMES DAILY WITH A MEAL. (Patient not taking: Reported on 06/01/2022) 180 tablet 1   diclofenac Sodium (VOLTAREN) 1 % GEL Apply 1 application topically 3 times daily as needed for pain 2 g 3   ferrous sulfate 325 (65 FE) MG tablet Take 325 mg by mouth daily with breakfast.     fluticasone (FLONASE) 50 MCG/ACT nasal spray PLACE 1 SPRAY INTO BOTH NOSTRILS DAILY AS NEEDED (CONGESTION). 16 g 1   fluticasone-salmeterol (ADVAIR) 100-50 MCG/ACT AEPB Inhale 1 puff into the lungs 2 (two) times daily. 1 each 3   folic acid (FOLVITE) 1 MG  tablet Take 1 tablet (1 mg total) by mouth daily. 30 tablet 3   furosemide (LASIX) 40 MG tablet Take 1 tablet (40 mg total) by mouth daily as needed. If you gain 3 lbs in 24 hours or 5 lbs in 1 week 30 tablet 1   glucose blood (ACCU-CHEK AVIVA PLUS) test strip Use as instructed 50 each 1   hyoscyamine (LEVSIN SL) 0.125 MG SL tablet Place 1 tablet (0.125 mg total) under the tongue every 6 (six) hours as needed. For abdominal cramping 30 tablet 0   insulin glargine (LANTUS) 100 UNIT/ML Solostar Pen Inject 24 Units into the skin every morning. 3 mL 11   Insulin Pen Needle 32G X 4 MM MISC 1 Needle by Does not apply route in the morning and at bedtime. 120 each 2   ipratropium (ATROVENT HFA) 17 MCG/ACT inhaler Inhale 2 puffs into the lungs every 4 (four) hours as needed for wheezing (COPD).     lactulose (CHRONULAC) 10 GM/15ML solution Take 30 mLs (20 g total) by mouth 2 (two) times daily as needed for mild constipation, moderate constipation or severe constipation. 236 mL 0   lidocaine-prilocaine (EMLA) cream APPLY 1 APPLICATION TOPICALLY AS NEEDED. 30 g 2   LORazepam (ATIVAN) 0.5 MG tablet Take 1 tablet (0.5 mg total) by mouth every 8 (eight) hours as needed for anxiety (nausea). 30 tablet 0   metoCLOPramide (REGLAN) 10 MG tablet Take 1 tablet (10 mg total) by mouth 3 (three) times daily before meals. 60 tablet 0   nicotine polacrilex (NICORETTE) 2 MG gum Take 1 each (2 mg total) by mouth as needed for smoking cessation. 100 tablet 0   nitroGLYCERIN (NITROSTAT) 0.4 MG SL tablet Place 1 tablet (0.4 mg total) under the tongue every 5 (five) minutes as needed for chest pain. 25 tablet 5   nystatin (MYCOSTATIN) 100000 UNIT/ML suspension Take 5 mLs (500,000 Units total) by mouth 4 (four) times daily. 60 mL 0   ondansetron (ZOFRAN-ODT) 8 MG disintegrating tablet Take 1 tablet (8 mg total) by mouth every 8 (eight) hours as needed for nausea or vomiting. (Patient not taking: Reported on 02/02/2022) 30 tablet 2    oxyCODONE (OXY IR/ROXICODONE) 5 MG immediate release tablet Take 1-2 tablets (5-10 mg total) by mouth every 4 (four) hours as needed for severe pain. 90 tablet 0   oxyCODONE ER (XTAMPZA ER) 13.5 MG C12A Take 13.5 mg by mouth every 12 (twelve) hours. 60 capsule 0   pantoprazole (PROTONIX) 40 MG tablet TAKE 1 TABLET (40 MG TOTAL) BY MOUTH DAILY. 30 tablet 1   polyethylene glycol powder (GAVILAX) 17 GM/SCOOP powder TAKE ONE CAPFUL (17 GRAMS )BY MOUTH DAILY AS NEEDED FOR MILD OR MODERATE CONSTIPATION. 510 g 1   potassium chloride SA (KLOR-CON M) 20 MEQ tablet Take 1 tablet (20 mEq total) by mouth daily as  needed. On days you take Lasix 30 tablet 1   prochlorperazine (COMPAZINE) 10 MG tablet TAKE 1 TABLET (10 MG TOTAL) BY MOUTH EVERY 6 (SIX) HOURS AS NEEDED FOR NAUSEA OR VOMITING. 30 tablet 0   valACYclovir (VALTREX) 1000 MG tablet TAKE FOR 3 DAYS AS NEEDED FOR EACH OUTBREAK 30 tablet 11   vitamin B-12 (CYANOCOBALAMIN) 1000 MCG tablet Take 1,000 mcg by mouth daily.     No current facility-administered medications for this visit.    SURGICAL HISTORY:  Past Surgical History:  Procedure Laterality Date   BLADDER SURGERY  1980   "TVT"   BLADDER SURGERY  2003   "TVT"   BREAST EXCISIONAL BIOPSY     BREAST SURGERY Right    I&D for multiple abscesses   CARDIAC CATHETERIZATION     CORONARY ARTERY BYPASS GRAFT N/A 06/30/2021   Procedure: CORONARY ARTERY BYPASS GRAFTING (CABG) x3 USING LEFT INTERNAL MAMMARY ARTERY,  RIGHT RADIAL ARTERY AND LEFT GREATER SAPHENOUS VEIN;  Surgeon: Corliss Skains, MD;  Location: [facility] OR;  Service: Open Heart Surgery;  Laterality: N/A;   cutting balloon     ENDOVEIN HARVEST OF GREATER SAPHENOUS VEIN Left 06/30/2021   Procedure: ENDOVEIN HARVEST OF GREATER SAPHENOUS VEIN;  Surgeon: Corliss Skains, MD;  Location: [facility] OR;  Service: Open Heart Surgery;  Laterality: Left;   EYE SURGERY Left    laser surgery   FINE NEEDLE ASPIRATION  08/21/2021   Procedure: FINE NEEDLE  ASPIRATION (FNA) LINEAR;  Surgeon: Josephine Igo, DO;  Location: [facility] ENDOS[facility];  Service: Pulmonary;;   INCISE AND DRAIN ABCESS  ; 04/26/11; 08/13/11   right breast   INCISION AND DRAINAGE ABSCESS Right 05/09/2012   Procedure: INCISION AND DRAINAGE RIGHT BREAST ABSCESS;  Surgeon: Wilmon Arms. Corliss Skains, MD;  Location: [facility] OR;  Service: General;  Laterality: Right;   INCISION AND DRAINAGE ABSCESS Right 02/08/2014   Procedure: INCISION AND DRAINAGE RIGHT BREAST ABSCESS;  Surgeon: Manus Rudd, MD;  Location: [facility];  Service: General;  Laterality: Right;   INCISION AND DRAINAGE ABSCESS Right 06/14/2014   Procedure: INCISION AND DRAINAGE RIGHT BREAST ABSCESS;  Surgeon: Manus Rudd, MD;  Location: [facility];  Service: General;  Laterality: Right;   IR IMAGING GUIDED PORT INSERTION  12/08/2021   IRRIGATION AND DEBRIDEMENT ABSCESS  12/19/2010   Procedure: IRRIGATION AND DEBRIDEMENT ABSCESS;  Surgeon: Emelia Loron, MD;  Location: [facility];  Service: General;  Laterality: Right;   IRRIGATION AND DEBRIDEMENT ABSCESS  08/13/2011   Procedure: MINOR INCISION AND DRAINAGE OF ABSCESS;  Surgeon: Cherylynn Ridges, MD;  Location: [facility];  Service: General;  Laterality: Right;  Right Breast    IRRIGATION AND DEBRIDEMENT ABSCESS  11/17/2011   Procedure: IRRIGATION AND DEBRIDEMENT ABSCESS;  Surgeon: Wilmon Arms. Corliss Skains, MD;  Location: [facility];  Service: General;  Laterality: Right;  irrigation and debridement right recurrent breast abscess   LARYNX SURGERY     LEFT HEART CATH AND CORONARY ANGIOGRAPHY N/A 04/09/2021   Procedure: LEFT HEART CATH AND CORONARY ANGIOGRAPHY;  Surgeon: Corky Crafts, MD;  Location: [facility]VAS[facility] CV LAB;  Service: Cardiovascular;  Laterality: N/A;   LEFT HEART CATHETERIZATION WITH CORONARY ANGIOGRAM N/A 05/29/2012   Procedure: LEFT HEART CATHETERIZATION WITH CORONARY ANGIOGRAM & PTCA;  Surgeon: Lennette Bihari, MD;  Location: [facility] CATH LAB;  Service: Cardiovascular; Bifurcation dRCA-RPAD/PLA & rPDA   PTCA.   LEFT HEART CATHETERIZATION WITH CORONARY ANGIOGRAM N/A 11/08/2013   Procedure: LEFT HEART CATHETERIZATION WITH CORONARY ANGIOGRAM and Coronary  Stent Intervention;  Surgeon: Marykay Lex, MD;  Location: [facility] CATH LAB;  Service: Cardiovascular; mLAD 80% (FFR 0.73) - resolute DES 3.0 x 22 mm (postdilated to 3.3 mm->3.5 mm)   NM MYOVIEW LTD  01/26/2009   Normal study, no evidence of ischemia, EF 67%   ployp removed from voice box 03/30/12     RADIAL ARTERY HARVEST Right 06/30/2021   Procedure: RADIAL ARTERY HARVEST;  Surgeon: Corliss Skains, MD;  Location: [facility];  Service: Open Heart Surgery;  Laterality: Right;   REFRACTIVE SURGERY  ~ 2010   right   TEE WITHOUT CARDIOVERSION N/A 06/30/2021   Procedure: TRANSESOPHAGEAL ECHOCARDIOGRAM (TEE);  Surgeon: Corliss Skains, MD;  Location: [facility] OR;  Service: Open Heart Surgery;  Laterality: N/A;   TONSILLECTOMY  1988   VIDEO BRONCHOSCOPY WITH ENDOBRONCHIAL ULTRASOUND Bilateral 08/21/2021   Procedure: VIDEO BRONCHOSCOPY WITH ENDOBRONCHIAL ULTRASOUND;  Surgeon: Josephine Igo, DO;  Location: [facility];  Service: Pulmonary;  Laterality: Bilateral;    REVIEW OF SYSTEMS:  A comprehensive review of systems was negative except for: Constitutional: positive for fatigue Eyes: positive for increased lacrimation    PHYSICAL EXAMINATION: General appearance: alert, cooperative, fatigued, and no distress Head: Normocephalic, without obvious abnormality, atraumatic Neck: no adenopathy, no JVD, supple, symmetrical, trachea midline, and thyroid not enlarged, symmetric, no tenderness/mass/nodules Lymph nodes: Cervical, supraclavicular, and axillary nodes normal. Resp: clear to auscultation bilaterally Back: symmetric, no curvature. ROM normal. No CVA tenderness. Cardio: regular rate and rhythm, S1, S2 normal, no murmur, click, rub or gallop GI: Tenderness to palpation Extremities: edema trace edema in the ankles  ECOG PERFORMANCE STATUS: 1 -  Symptomatic but completely ambulatory  Blood pressure 104/71, pulse 63, temperature 97.7 F (36.5 C), temperature source Oral, resp. rate 17, weight 192 lb 3.2 oz (87.2 kg), last menstrual period 06/12/2016, SpO2 100 %.  LABORATORY DATA: Lab Results  Component Value Date   WBC 5.5 06/01/2022   HGB 9.4 (L) 06/01/2022   HCT 27.0 (L) 06/01/2022   MCV 103.4 (H) 06/01/2022   PLT 267 06/01/2022      Chemistry      Component Value Date/Time   NA 138 06/01/2022 1046   NA 140 04/03/2021 1059   K 4.0 06/01/2022 1046   CL 108 06/01/2022 1046   CO2 27 06/01/2022 1046   BUN 10 06/01/2022 1046   BUN 13 04/03/2021 1059   CREATININE 0.98 06/01/2022 1046   CREATININE 0.65 11/06/2013 1520      Component Value Date/Time   CALCIUM 8.5 (L) 06/01/2022 1046   ALKPHOS 64 06/01/2022 1046   AST 22 06/01/2022 1046   ALT 19 06/01/2022 1046   BILITOT 0.4 06/01/2022 1046       RADIOGRAPHIC STUDIES: CT Chest W Contrast  Result Date: 05/31/2022 CLINICAL DATA:  Staging non-small-cell lung cancer. Last chemotherapy 05/12/2022. * Tracking Code: BO * EXAM: CT CHEST, ABDOMEN, AND PELVIS WITH CONTRAST TECHNIQUE: Multidetector CT imaging of the chest, abdomen and pelvis was performed following the standard protocol during bolus administration of intravenous contrast. RADIATION DOSE REDUCTION: This exam was performed according to the departmental dose-optimization program which includes automated exposure control, adjustment of the mA and/or kV according to patient size and/or use of iterative reconstruction technique. CONTRAST:  OMNIPAQUE IOHEXOL 300 MG/ML  SOLN COMPARISON:  CT 03/29/2022 FINDINGS: CT CHEST FINDINGS Cardiovascular: Status post median sternotomy. Heart is nonenlarged. Small pericardial effusion. Scattered vascular calcifications are seen including the thoracic aorta and coronary arteries. Normal caliber thoracic  aorta. Left-sided chest port again noted but incompletely included in the imaging  field. Mediastinum/Nodes: No specific abnormal lymph node enlargement identified in the axillary region, hilum or mediastinum. Normal caliber thoracic esophagus. Small cyst along the right side of the thyroid, unchanged from previous. No specific imaging follow-up based on appearance. Lungs/Pleura: Lungs are without consolidation, pneumothorax or effusion. There is some dependent atelectasis noted bilaterally. Few subpleural blebs and emphysematous changes identified in the upper lung zones. The previous spiculated nodule in the right upper lobe is again noted. Previously this measured 18 x 8 mm and today when measured in the same fashion for continuity measures 18 by 9 mm on series 4, image 43. No new dominant lung nodule. Musculoskeletal: Mild degenerative changes along the spine. Old healed left-sided rib fracture. CT ABDOMEN PELVIS FINDINGS Hepatobiliary: Once again benign lesions are seen in the liver involving segment 1 and 7 near the IVC. Previous lesions on the prior PET-CT scan of 08/28/2021 are not seen on the current examination. Only minimal area of capsular retraction the left lobe lateral segment. No new space-occupying liver lesion. Patent portal vein. Gallbladder is present. Pancreas: Unremarkable. No pancreatic ductal dilatation or surrounding inflammatory changes. Spleen: Normal in size without focal abnormality. Adrenals/Urinary Tract: Preserved left adrenal gland. Right-sided adrenal nodule once again identified also demonstrating long-term stability measuring 2.6 by 1.9 cm on series 2, image 59. Again favor a benign lesion. No enhancing renal mass or collecting system dilatation. Preserved contours of the urinary bladder. The bladder wall slightly thickened, unchanged from previous. Stomach/Bowel: Large bowel has a normal course and caliber with diffuse colonic stool. Overall moderate stool burden. Normal appendix. Stomach and small bowel are nondilated. There appear to be areas of mild wall  thickening along loops of small bowel. Please correlate with any history. Vascular/Lymphatic: Normal caliber aorta and IVC with scattered vascular calcifications. No discrete abnormal lymph node enlargement identified in the abdomen and pelvis. Reproductive: Uterus and bilateral adnexa are unremarkable. Other: Anasarca is increased from previous. Mesenteric haziness and mild ascites identified, similar to previous. Musculoskeletal: Scattered sclerotic bone lesions identified along the spine and pelvis. Distribution similar based on CT. If needed a follow up bone scan as clinically appropriate. IMPRESSION: Stable right upper lobe spiculated lung nodule. No developing new lung mass or lymph node enlargement in the thorax. Stable benign-appearing lesions in the liver, right adrenal gland. No new or recurrent liver mass. No developing lymph node enlargement. Increasing anasarca. Stable mesenteric stranding and trace ascites. There appears to be some mild fold thickening along loops of small bowel without obstruction or dilatation. Please correlate with any particular symptoms. Scattered areas of bony sclerosis, similar distribution by CT. If needed additional workup as clinically appropriate Electronically Signed   By: Karen Kays M.D.   On: 05/31/2022 11:06   CT Abdomen Pelvis W Contrast  Result Date: 05/31/2022 CLINICAL DATA:  Staging non-small-cell lung cancer. Last chemotherapy 05/12/2022. * Tracking Code: BO * EXAM: CT CHEST, ABDOMEN, AND PELVIS WITH CONTRAST TECHNIQUE: Multidetector CT imaging of the chest, abdomen and pelvis was performed following the standard protocol during bolus administration of intravenous contrast. RADIATION DOSE REDUCTION: This exam was performed according to the departmental dose-optimization program which includes automated exposure control, adjustment of the mA and/or kV according to patient size and/or use of iterative reconstruction technique. CONTRAST:  OMNIPAQUE IOHEXOL  300 MG/ML  SOLN COMPARISON:  CT 03/29/2022 FINDINGS: CT CHEST FINDINGS Cardiovascular: Status post median sternotomy. Heart is nonenlarged. Small pericardial effusion.  Scattered vascular calcifications are seen including the thoracic aorta and coronary arteries. Normal caliber thoracic aorta. Left-sided chest port again noted but incompletely included in the imaging field. Mediastinum/Nodes: No specific abnormal lymph node enlargement identified in the axillary region, hilum or mediastinum. Normal caliber thoracic esophagus. Small cyst along the right side of the thyroid, unchanged from previous. No specific imaging follow-up based on appearance. Lungs/Pleura: Lungs are without consolidation, pneumothorax or effusion. There is some dependent atelectasis noted bilaterally. Few subpleural blebs and emphysematous changes identified in the upper lung zones. The previous spiculated nodule in the right upper lobe is again noted. Previously this measured 18 x 8 mm and today when measured in the same fashion for continuity measures 18 by 9 mm on series 4, image 43. No new dominant lung nodule. Musculoskeletal: Mild degenerative changes along the spine. Old healed left-sided rib fracture. CT ABDOMEN PELVIS FINDINGS Hepatobiliary: Once again benign lesions are seen in the liver involving segment 1 and 7 near the IVC. Previous lesions on the prior PET-CT scan of 08/28/2021 are not seen on the current examination. Only minimal area of capsular retraction the left lobe lateral segment. No new space-occupying liver lesion. Patent portal vein. Gallbladder is present. Pancreas: Unremarkable. No pancreatic ductal dilatation or surrounding inflammatory changes. Spleen: Normal in size without focal abnormality. Adrenals/Urinary Tract: Preserved left adrenal gland. Right-sided adrenal nodule once again identified also demonstrating long-term stability measuring 2.6 by 1.9 cm on series 2, image 59. Again favor a benign lesion. No  enhancing renal mass or collecting system dilatation. Preserved contours of the urinary bladder. The bladder wall slightly thickened, unchanged from previous. Stomach/Bowel: Large bowel has a normal course and caliber with diffuse colonic stool. Overall moderate stool burden. Normal appendix. Stomach and small bowel are nondilated. There appear to be areas of mild wall thickening along loops of small bowel. Please correlate with any history. Vascular/Lymphatic: Normal caliber aorta and IVC with scattered vascular calcifications. No discrete abnormal lymph node enlargement identified in the abdomen and pelvis. Reproductive: Uterus and bilateral adnexa are unremarkable. Other: Anasarca is increased from previous. Mesenteric haziness and mild ascites identified, similar to previous. Musculoskeletal: Scattered sclerotic bone lesions identified along the spine and pelvis. Distribution similar based on CT. If needed a follow up bone scan as clinically appropriate. IMPRESSION: Stable right upper lobe spiculated lung nodule. No developing new lung mass or lymph node enlargement in the thorax. Stable benign-appearing lesions in the liver, right adrenal gland. No new or recurrent liver mass. No developing lymph node enlargement. Increasing anasarca. Stable mesenteric stranding and trace ascites. There appears to be some mild fold thickening along loops of small bowel without obstruction or dilatation. Please correlate with any particular symptoms. Scattered areas of bony sclerosis, similar distribution by CT. If needed additional workup as clinically appropriate Electronically Signed   By: Karen Kays M.D.   On: 05/31/2022 11:06    ASSESSMENT AND PLAN: This is a very pleasant 57 years old African-American female recently diagnosed with a stage IV (T2 a, N2, M1 C) non-small cell lung cancer, adenocarcinoma presented with right upper lobe lung mass in addition to right hilar and mediastinal lymphadenopathy as well as  innumerable bilateral pulmonary nodules and bone and liver metastasis diagnosed in August 2023 with positive KRAS G12C mutation and PD-L1 expression of 89%. She is currently undergoing systemic chemotherapy with carboplatin for AUC of 5, Alimta 500 Mg/M2 and Keytruda 200 Mg IV every 3 weeks status post 14 cycles.  The patient has  been tolerating this treatment well with no concerning adverse effect except for the increased lacrimation and swelling of the lower extremities.  This is likely from her treatment with Alimta.  I discussed with the patient discontinuing Alimta and proceeding with maintenance Keytruda single agent but she declined this option and she would like to continue with the 2 medication for now.  She will proceed with cycle #15 today as planned. For the swelling of the lower extremities, she will continue her current treatment with Lasix for now. For the lacrimation of the eye, she was advised to use over-the-counter saline eyedrops. For the gastrointestinal symptoms, she has an appointment with [facility] gastroenterology on June 23, 2022. For the dyspepsia and epigastric pain, she will continue her current treatment with Protonix. For the pain management she is followed by the palliative care team and currently on Xtampza ER 13.5 mg every 12 hours in addition to Oxy IR for breakthrough pain. I will see her back for follow-up visit in 3 weeks for evaluation before the next cycle of her treatment. The patient voices understanding of current disease status and treatment options and is in agreement with the current care plan.  All questions were answered. The patient knows to call the clinic with any problems, questions or concerns. We can certainly see the patient much sooner if necessary. The total time spent in the appointment was 20 minutes.  Disclaimer: This note was dictated with voice recognition software. Similar sounding words can inadvertently be transcribed and may not be  corrected upon review.

## 2022-06-22 NOTE — Patient Instructions (Signed)
[facility]  Discharge Instructions: Thank you for choosing [facility] to provide your oncology and hematology care.   If you have a lab appointment with the Cancer Center, please go directly to the Cancer Center and check in at the registration area.   Wear comfortable clothing and clothing appropriate for easy access to any Portacath or PICC line.   We strive to give you quality time with your provider. You may need to reschedule your appointment if you arrive late (15 or more minutes).  Arriving late affects you and other patients whose appointments are after yours.  Also, if you miss three or more appointments without notifying the office, you may be dismissed from the clinic at the provider's discretion.      For prescription refill requests, have your pharmacy contact our office and allow 72 hours for refills to be completed.    Today you received the following chemotherapy and/or immunotherapy agents: pembrolizumab and pemetrexed      To help prevent nausea and vomiting after your treatment, we encourage you to take your nausea medication as directed.  BELOW ARE SYMPTOMS THAT SHOULD BE REPORTED IMMEDIATELY: *FEVER GREATER THAN 100.4 F (38 C) OR HIGHER *CHILLS OR SWEATING *NAUSEA AND VOMITING THAT IS NOT CONTROLLED WITH YOUR NAUSEA MEDICATION *UNUSUAL SHORTNESS OF BREATH *UNUSUAL BRUISING OR BLEEDING *URINARY PROBLEMS (pain or burning when urinating, or frequent urination) *BOWEL PROBLEMS (unusual diarrhea, constipation, pain near the anus) TENDERNESS IN MOUTH AND THROAT WITH OR WITHOUT PRESENCE OF ULCERS (sore throat, sores in mouth, or a toothache) UNUSUAL RASH, SWELLING OR PAIN  UNUSUAL VAGINAL DISCHARGE OR ITCHING   Items with * indicate a potential emergency and should be followed up as soon as possible or go to the Emergency Department if any problems should occur.  Please show the CHEMOTHERAPY ALERT CARD or IMMUNOTHERAPY  ALERT CARD at check-in to the Emergency Department and triage nurse.  Should you have questions after your visit or need to cancel or reschedule your appointment, please contact [facility]  Dept: [phone]  and follow the prompts.  Office hours are 8:00 a.m. to 4:30 p.m. Monday - Friday. Please note that voicemails left after 4:00 p.m. may not be returned until the following business day.  We are closed weekends and major holidays. You have access to a nurse at all times for urgent questions. Please call the main number to the clinic Dept: [phone] and follow the prompts.   For any non-urgent questions, you may also contact your provider using MyChart. We now offer e-Visits for anyone 18 and older to request care online for non-urgent symptoms. For details visit [email or web address].   Also download the MyChart app! Go to the app store, search "MyChart", open the app, select [facility], and log in with your MyChart username and password.   

## 2022-06-23 ENCOUNTER — Ambulatory Visit: Payer: Medicaid Other | Admitting: Gastroenterology

## 2022-06-23 ENCOUNTER — Encounter: Payer: Self-pay | Admitting: Neurology

## 2022-06-23 ENCOUNTER — Other Ambulatory Visit (INDEPENDENT_AMBULATORY_CARE_PROVIDER_SITE_OTHER): Payer: Medicaid Other

## 2022-06-23 ENCOUNTER — Encounter: Payer: Self-pay | Admitting: Gastroenterology

## 2022-06-23 ENCOUNTER — Ambulatory Visit: Payer: Medicaid Other | Admitting: Neurology

## 2022-06-23 VITALS — BP 116/72 | HR 77 | Ht 67.5 in | Wt 191.0 lb

## 2022-06-23 VITALS — BP 130/80 | HR 80 | Ht 67.0 in | Wt 189.0 lb

## 2022-06-23 DIAGNOSIS — K5904 Chronic idiopathic constipation: Secondary | ICD-10-CM | POA: Diagnosis not present

## 2022-06-23 DIAGNOSIS — E1142 Type 2 diabetes mellitus with diabetic polyneuropathy: Secondary | ICD-10-CM

## 2022-06-23 DIAGNOSIS — T451X5A Adverse effect of antineoplastic and immunosuppressive drugs, initial encounter: Secondary | ICD-10-CM

## 2022-06-23 DIAGNOSIS — R4589 Other symptoms and signs involving emotional state: Secondary | ICD-10-CM | POA: Diagnosis not present

## 2022-06-23 DIAGNOSIS — R209 Unspecified disturbances of skin sensation: Secondary | ICD-10-CM

## 2022-06-23 DIAGNOSIS — G620 Drug-induced polyneuropathy: Secondary | ICD-10-CM

## 2022-06-23 DIAGNOSIS — R634 Abnormal weight loss: Secondary | ICD-10-CM | POA: Diagnosis not present

## 2022-06-23 MED ORDER — PANTOPRAZOLE SODIUM 40 MG PO TBEC: 40.0000 mg | DELAYED_RELEASE_TABLET | Freq: Two times a day (BID) | ORAL | 5 refills | Status: DC

## 2022-06-23 MED ORDER — DULOXETINE HCL 30 MG PO CPEP: 30.0000 mg | ORAL_CAPSULE | Freq: Every day | ORAL | 5 refills | Status: DC

## 2022-06-23 NOTE — Patient Instructions (Addendum)
Start Cymbalta 30 mg at bedtime. This has been sent to your pharmacy. Let me know if you have any problems getting the medication, side effects, or if it is not helping after a few weeks.  You can also try Lidocaine cream as needed. Apply wear you have pain, tingling, or burning. Wear gloves to prevent your hands being numb. This can be bought over the counter at any drug store or online.   Continue B12 and folate supplementation.  I will do blood work today.  Discuss leg swelling with your primary care doctor as this can add to leg and foot pain.  Follow up in 1 year or sooner if needed.  The physicians and staff at [facility] are committed to providing excellent care. You may receive a survey requesting feedback about your experience at our office. We strive to receive "very good" responses to the survey questions. If you feel that your experience would prevent you from giving the office a "very good " response, please contact our office to try to remedy the situation. We may be reached at [phone]. Thank you for taking the time out of your busy day to complete the survey.  Jacquelyne Balint, MD [facility]

## 2022-06-23 NOTE — Addendum Note (Signed)
Addended by: Karl Luke A on: 06/23/2022 12:13 PM   Modules accepted: Orders

## 2022-06-23 NOTE — Patient Instructions (Signed)
Stop Lactulose and stool softener.  BOWEL PURGE:   I am recommending a bowel purge to aid in cleaning out your bowels.    OVER THE COUNTER SHOPPING GUIDE:   Purchase 1 (one) 119 GRAM bottle of Miralax Purchase 1 (one) 32 ounce bottle of Gatorade   STEPS:   Mix the entire bottle of Miralax in the 32 ounces of Gatorade and stir to dissolve completely. Drink the Miralax solution 8 oz every 15 minutes until gone. You will drink this mixture over the next 2-3 hours.  You should expect results within 1 to 6 hours after completing this bowel purge.   We have sent the following medications to your pharmacy for you to pick up at your convenience: Pantoprazole 40 mg twice daily 30-60 minutes before breakfast and dinner.   _______________________________________________________  If your blood pressure at your visit was 140/90 or greater, please contact your primary care physician to follow up on this.  _______________________________________________________  If you are age 54 or older, your body mass index should be between 23-30. Your Body mass index is 29.6 kg/m. If this is out of the aforementioned range listed, please consider follow up with your Primary Care Provider.  If you are age 24 or younger, your body mass index should be between 19-25. Your Body mass index is 29.6 kg/m. If this is out of the aformentioned range listed, please consider follow up with your Primary Care Provider.   ________________________________________________________  The [facility] providers would like to encourage you to use [facility] to communicate with providers for non-urgent requests or questions.  Due to long hold times on the telephone, sending your provider a message by [facility] may be a faster and more efficient way to get a response.  Please allow 48 business hours for a response.  Please remember that this is for non-urgent requests.  _______________________________________________________

## 2022-06-23 NOTE — Progress Notes (Signed)
06/23/2022 Cassidy [name] [id number] 06-13-1965   HISTORY OF PRESENT ILLNESS: This is a 57 year old female who is a patient of Dr. Lavon Paganini.  She has DM@ on insulin, coronary artery disease and had a CABG x 3 in June 2023.  Subsequently diagnosed with stage IV non-small cell lung cancer and follows with Dr. Shirline Frees.  She is on chemotherapy.  She is here today for evaluation of issues related to constipation.  She says that she did take MiraLAX twice a day for 3 weeks but otherwise has been using stool softeners, prune juice, lactulose, and now only doing Miralax a few times per week.  Says that she has sweats and feels like passing out when she is to have a bowel movement.  Has some lower abdominal pain associated with the constipation.  Colonoscopy 02/2021: - One 2 mm polyp in the sigmoid colon, removed with a cold biopsy forceps. Resected and retrieved. - Five 4 to 6 mm polyps in the sigmoid colon and in the ascending colon, removed with a cold snare. Resected and retrieved. - Mild diverticulosis in the sigmoid colon and in the ascending colon. Peri-diverticular erythema was seen. - Non-bleeding internal hemorrhoids.  Pathology showed tubular adenoma.  EGD 02/2021: Grade B esophagitis and a small hiatal hernia.  Also complaining of some heartburn/reflux.  She is not on any PPI therapy.  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  s/p PCTA to dRCA and ostial RPAV-PLA vessel + PDA branch (05/2012)  b. [place] s/p DES to mLAD - Resolute DES 3.0 x 22 (3.74mm -->3.3 mm)   Bipolar depression (HCC)    Breast abscess    a. right side.    COPD (chronic obstructive pulmonary disease) (HCC)    Depression    Diabetic peripheral neuropathy (HCC)    Fibromyalgia    Genital warts    GERD (gastroesophageal  reflux disease)    History of hiatal hernia    HLD (hyperlipidemia)    Hypertension    Lung cancer (HCC)    Pain in limb    a. LE VENOUS DUPLEX, 02/05/2009 - no evidence of deep vein thrombosis, Baker's cyst   Pneumonia    PTSD (post-traumatic stress disorder)    Sickle cell trait (HCC)    Tobacco abuse    Tooth caries    Type II diabetes mellitus (HCC)    Past Surgical History:  Procedure Laterality Date   BLADDER SURGERY  1980   "TVT"   BLADDER SURGERY  2003   "TVT"   BREAST EXCISIONAL BIOPSY     BREAST SURGERY Right    I&D for multiple abscesses   CARDIAC CATHETERIZATION     CORONARY ARTERY BYPASS GRAFT N/A 06/30/2021   Procedure: CORONARY ARTERY BYPASS GRAFTING (CABG) x3 USING LEFT INTERNAL MAMMARY ARTERY,  RIGHT RADIAL ARTERY AND LEFT GREATER SAPHENOUS VEIN;  Surgeon: Corliss Skains, MD;  Location: [facility] OR;  Service: Open Heart Surgery;  Laterality: N/A;   cutting balloon     ENDOVEIN HARVEST OF GREATER SAPHENOUS VEIN Left 06/30/2021   Procedure: ENDOVEIN HARVEST OF GREATER SAPHENOUS VEIN;  Surgeon: Corliss Skains, MD;  Location: [facility] OR;  Service: Open Heart Surgery;  Laterality: Left;   EYE SURGERY Left    laser surgery   FINE  NEEDLE ASPIRATION  08/21/2021   Procedure: FINE NEEDLE ASPIRATION (FNA) LINEAR;  Surgeon: Josephine Igo, DO;  Location: [facility];  Service: Pulmonary;;   INCISE AND DRAIN ABCESS  ; 04/26/11; 08/13/11   right breast   INCISION AND DRAINAGE ABSCESS Right 05/09/2012   Procedure: INCISION AND DRAINAGE RIGHT BREAST ABSCESS;  Surgeon: Wilmon Arms. Corliss Skains, MD;  Location: [facility] OR;  Service: General;  Laterality: Right;   INCISION AND DRAINAGE ABSCESS Right 02/08/2014   Procedure: INCISION AND DRAINAGE RIGHT BREAST ABSCESS;  Surgeon: Manus Rudd, MD;  Location: [facility] OR;  Service: General;  Laterality: Right;   INCISION AND DRAINAGE ABSCESS Right 06/14/2014   Procedure: INCISION AND DRAINAGE RIGHT BREAST ABSCESS;  Surgeon: Manus Rudd, MD;  Location:  [facility] OR;  Service: General;  Laterality: Right;   IR IMAGING GUIDED PORT INSERTION  12/08/2021   IRRIGATION AND DEBRIDEMENT ABSCESS  12/19/2010   Procedure: IRRIGATION AND DEBRIDEMENT ABSCESS;  Surgeon: Emelia Loron, MD;  Location: [facility];  Service: General;  Laterality: Right;   IRRIGATION AND DEBRIDEMENT ABSCESS  08/13/2011   Procedure: MINOR INCISION AND DRAINAGE OF ABSCESS;  Surgeon: Cherylynn Ridges, MD;  Location: [facility];  Service: General;  Laterality: Right;  Right Breast    IRRIGATION AND DEBRIDEMENT ABSCESS  11/17/2011   Procedure: IRRIGATION AND DEBRIDEMENT ABSCESS;  Surgeon: Wilmon Arms. Corliss Skains, MD;  Location: [facility];  Service: General;  Laterality: Right;  irrigation and debridement right recurrent breast abscess   LARYNX SURGERY     LEFT HEART CATH AND CORONARY ANGIOGRAPHY N/A 04/09/2021   Procedure: LEFT HEART CATH AND CORONARY ANGIOGRAPHY;  Surgeon: Corky Crafts, MD;  Location: [facility] INVASIVE CV LAB;  Service: Cardiovascular;  Laterality: N/A;   LEFT HEART CATHETERIZATION WITH CORONARY ANGIOGRAM N/A 05/29/2012   Procedure: LEFT HEART CATHETERIZATION WITH CORONARY ANGIOGRAM & PTCA;  Surgeon: Lennette Bihari, MD;  Location: [facility] CATH LAB;  Service: Cardiovascular; Bifurcation dRCA-RPAD/PLA & rPDA  PTCA.   LEFT HEART CATHETERIZATION WITH CORONARY ANGIOGRAM N/A 11/08/2013   Procedure: LEFT HEART CATHETERIZATION WITH CORONARY ANGIOGRAM and Coronary Stent Intervention;  Surgeon: Marykay Lex, MD;  Location: [facility] CATH LAB;  Service: Cardiovascular; mLAD 80% (FFR 0.73) - resolute DES 3.0 x 22 mm (postdilated to 3.3 mm->3.5 mm)   NM MYOVIEW LTD  01/26/2009   Normal study, no evidence of ischemia, EF 67%   ployp removed from voice box 03/30/12     RADIAL ARTERY HARVEST Right 06/30/2021   Procedure: RADIAL ARTERY HARVEST;  Surgeon: Corliss Skains, MD;  [facility]: [facility];  Service: Open Heart Surgery;  Laterality: Right;   REFRACTIVE SURGERY  ~ 2010   right   TEE WITHOUT CARDIOVERSION N/A  06/30/2021   Procedure: TRANSESOPHAGEAL ECHOCARDIOGRAM (TEE);  Surgeon: Corliss Skains, MD;  Location: [facility] OR;  Service: Open Heart Surgery;  Laterality: N/A;   TONSILLECTOMY  1988   VIDEO BRONCHOSCOPY WITH ENDOBRONCHIAL ULTRASOUND Bilateral 08/21/2021   Procedure: VIDEO BRONCHOSCOPY WITH ENDOBRONCHIAL ULTRASOUND;  Surgeon: Josephine Igo, DO;  Location: [facility];  Service: Pulmonary;  Laterality: Bilateral;    reports that she has been smoking cigarettes. She started smoking about 43 years ago. She has a 21.00 pack-year smoking history. She has never used smokeless tobacco. She reports that she does not currently use drugs. She reports that she does not drink alcohol. family history includes COPD in her mother; Cancer in her maternal aunt and mother; Crohn's disease in her maternal aunt; Diabetes in her maternal grandmother; Esophageal  cancer in her mother; Heart disease in her father and mother; Hyperlipidemia in her father; Hypertension in her brother and maternal grandmother; Stomach cancer in her mother. Allergies  Allergen Reactions   Amoxil [Amoxicillin] Hives    Has patient had a PCN reaction causing immediate rash, facial/tongue/throat swelling, SOB or lightheadedness with hypotension: Yes Has patient had a PCN reaction causing severe rash involving mucus membranes or skin necrosis: No Has patient had a PCN reaction that required hospitalization No Has patient had a PCN reaction occurring within the last 10 years: Yes If all of the above answers are "NO", then may proceed with Cephalosporin use.   Chantix [Varenicline] Other (See Comments)    Nightmares Hallucinations     Suboxone [Buprenorphine Hcl-Naloxone Hcl] Nausea And Vomiting   Emend [Fosaprepitant Dimeglumine] Itching    See notes for transfusion on 8/23. Pt reported new onset of itching after Emend infusion was started. 25mg  Benadryl IV given per MD. Pt tolerated remaining infusion well.       Outpatient Encounter  Medications as of 06/23/2022  Medication Sig   aspirin EC 81 MG tablet Take 81 mg by mouth daily.   atorvastatin (LIPITOR) 40 MG tablet TAKE 1 TABLET (40 MG TOTAL) BY MOUTH DAILY.   busPIRone (BUSPAR) 15 MG tablet Take 0.5 tablets (7.5 mg total) by mouth 2 (two) times daily.   Carboxymethylcellul-Glycerin (LUBRICATING EYE DROPS OP) Place 1 drop into both eyes daily as needed (dry eyes).   carvedilol (COREG) 12.5 MG tablet TAKE 1 TABLET (12.5 MG TOTAL) BY MOUTH 2 (TWO) TIMES DAILY WITH A MEAL.   diclofenac Sodium (VOLTAREN) 1 % GEL Apply 1 application topically 3 times daily as needed for pain   ferrous sulfate 325 (65 FE) MG tablet Take 325 mg by mouth daily with breakfast.   fluticasone (FLONASE) 50 MCG/ACT nasal spray PLACE 1 SPRAY INTO BOTH NOSTRILS DAILY AS NEEDED (CONGESTION).   fluticasone-salmeterol (ADVAIR) 100-50 MCG/ACT AEPB Inhale 1 puff into the lungs 2 (two) times daily.   folic acid (FOLVITE) 1 MG tablet Take 1 tablet (1 mg total) by mouth daily.   furosemide (LASIX) 40 MG tablet Take 1 tablet (40 mg total) by mouth daily as needed. If you gain 3 lbs in 24 hours or 5 lbs in 1 week   glucose blood (ACCU-CHEK AVIVA PLUS) test strip Use as instructed   hyoscyamine (LEVSIN SL) 0.125 MG SL tablet Place 1 tablet (0.125 mg total) under the tongue every 6 (six) hours as needed. For abdominal cramping   insulin glargine (LANTUS) 100 UNIT/ML Solostar Pen Inject 24 Units into the skin every morning.   Insulin Pen Needle 32G X 4 MM MISC 1 Needle by Does not apply route in the morning and at bedtime.   ipratropium (ATROVENT HFA) 17 MCG/ACT inhaler Inhale 2 puffs into the lungs every 4 (four) hours as needed for wheezing (COPD).   lactulose (CHRONULAC) 10 GM/15ML solution Take 30 mLs (20 g total) by mouth 2 (two) times daily as needed for mild constipation, moderate constipation or severe constipation.   lidocaine-prilocaine (EMLA) cream APPLY 1 APPLICATION TOPICALLY AS NEEDED.   LORazepam  (ATIVAN) 0.5 MG tablet Take 1 tablet (0.5 mg total) by mouth every 8 (eight) hours as needed for anxiety (nausea).   metoCLOPramide (REGLAN) 10 MG tablet Take 1 tablet (10 mg total) by mouth 3 (three) times daily before meals.   nicotine polacrilex (NICORETTE) 2 MG gum Take 1 each (2 mg total) by mouth as needed for  smoking cessation.   nitroGLYCERIN (NITROSTAT) 0.4 MG SL tablet Place 1 tablet (0.4 mg total) under the tongue every 5 (five) minutes as needed for chest pain.   nystatin (MYCOSTATIN) 100000 UNIT/ML suspension Take 5 mLs (500,000 Units total) by mouth 4 (four) times daily.   ondansetron (ZOFRAN-ODT) 8 MG disintegrating tablet Take 1 tablet (8 mg total) by mouth every 8 (eight) hours as needed for nausea or vomiting.   [START ON 06/25/2022] oxyCODONE (OXY IR/ROXICODONE) 5 MG immediate release tablet Take 1-2 tablets (5-10 mg total) by mouth every 4 (four) hours as needed for severe pain.   oxyCODONE ER (XTAMPZA ER) 13.5 MG C12A Take 13.5 mg by mouth every 12 (twelve) hours.   pantoprazole (PROTONIX) 40 MG tablet TAKE 1 TABLET (40 MG TOTAL) BY MOUTH DAILY.   polyethylene glycol powder (GAVILAX) 17 GM/SCOOP powder TAKE ONE CAPFUL (17 GRAMS )BY MOUTH DAILY AS NEEDED FOR MILD OR MODERATE CONSTIPATION.   potassium chloride SA (KLOR-CON M) 20 MEQ tablet Take 1 tablet (20 mEq total) by mouth daily as needed. On days you take Lasix   prochlorperazine (COMPAZINE) 10 MG tablet TAKE 1 TABLET (10 MG TOTAL) BY MOUTH EVERY 6 (SIX) HOURS AS NEEDED FOR NAUSEA OR VOMITING.   valACYclovir (VALTREX) 1000 MG tablet TAKE FOR 3 DAYS AS NEEDED FOR EACH OUTBREAK   vitamin B-12 (CYANOCOBALAMIN) 1000 MCG tablet Take 1,000 mcg by mouth daily.   No facility-administered encounter medications on file as of 06/23/2022.    REVIEW OF SYSTEMS  : All other systems reviewed and negative except where noted in the History of Present Illness.   PHYSICAL EXAM: BP 130/80   Pulse 80   Ht 5\' 7"  (1.702 m)   Wt 189 lb (85.7  kg)   LMP 06/12/2016 (Approximate)   SpO2 98%   BMI 29.60 kg/m  General: Well developed female in no acute distress Head: Normocephalic and atraumatic Eyes:  Sclerae anicteric, conjunctiva pink. Ears: Normal auditory acuity Lungs: Clear throughout to auscultation; no W/R/R. Heart: Regular rate and rhythm; no M/R/G. Abdomen: Soft, non-distended.  BS present.  Some left sided TTP.   Musculoskeletal: Symmetrical with no gross deformities  Skin: No lesions on visible extremities Extremities: No edema  Neurological: Alert oriented x 4, grossly non-focal Psychological:  Alert and cooperative. Normal mood and affect  ASSESSMENT AND PLAN: *Constipation: I think that this has somewhat been a chronic issue for her, but now recently worsened by pain medications and possibly chemotherapy as well.  Will discontinue lactulose and stool softeners.  I am going to have her do MiraLAX bowel purge and then MiraLAX twice daily.  She did try Linzess 290 mcg in the past and it was too strong for her, but we could try a lower dose if needed. *GERD: Will start pantoprazole 40 mg daily.  Prescription sent to pharmacy.  **She will follow-up with me in 8 to 12 weeks.   CC:  [name], DO

## 2022-06-24 LAB — T4: T4, Total: 10.5 ug/dL (ref 4.5–12.0)

## 2022-06-25 ENCOUNTER — Encounter: Payer: Self-pay | Admitting: Gastroenterology

## 2022-06-25 DIAGNOSIS — K5904 Chronic idiopathic constipation: Secondary | ICD-10-CM | POA: Insufficient documentation

## 2022-06-26 LAB — VITAMIN B1: Vitamin B1 (Thiamine): 8 nmol/L (ref 8–30)

## 2022-07-02 ENCOUNTER — Other Ambulatory Visit: Payer: Self-pay | Admitting: Cardiovascular Disease

## 2022-07-02 NOTE — Telephone Encounter (Signed)
Pt's pharmacy is requesting a refill on diclofenac Sodium 1% gel, this is not a cardiac medication. Would Dr. Flora Lipps like to reorder this medication? Please address

## 2022-07-08 ENCOUNTER — Ambulatory Visit: Payer: Medicaid Other | Admitting: Obstetrics

## 2022-07-08 ENCOUNTER — Encounter: Payer: Self-pay | Admitting: Obstetrics

## 2022-07-08 ENCOUNTER — Other Ambulatory Visit (HOSPITAL_COMMUNITY)
Admission: RE | Admit: 2022-07-08 | Discharge: 2022-07-08 | Disposition: A | Discharge status: Home / Self Care | Payer: Medicaid Other | Source: Ambulatory Visit

## 2022-07-08 VITALS — BP 83/59 | HR 74 | Ht 67.5 in | Wt 184.0 lb

## 2022-07-08 DIAGNOSIS — N898 Other specified noninflammatory disorders of vagina: Secondary | ICD-10-CM

## 2022-07-08 DIAGNOSIS — C3490 Malignant neoplasm of unspecified part of unspecified bronchus or lung: Secondary | ICD-10-CM | POA: Diagnosis not present

## 2022-07-08 DIAGNOSIS — Z113 Encounter for screening for infections with a predominantly sexual mode of transmission: Secondary | ICD-10-CM

## 2022-07-08 DIAGNOSIS — Z01419 Encounter for gynecological examination (general) (routine) without abnormal findings: Secondary | ICD-10-CM | POA: Insufficient documentation

## 2022-07-08 DIAGNOSIS — A6000 Herpesviral infection of urogenital system, unspecified: Secondary | ICD-10-CM | POA: Diagnosis not present

## 2022-07-08 DIAGNOSIS — Z1239 Encounter for other screening for malignant neoplasm of breast: Secondary | ICD-10-CM | POA: Diagnosis not present

## 2022-07-08 DIAGNOSIS — Z720 Tobacco use: Secondary | ICD-10-CM

## 2022-07-08 DIAGNOSIS — Z1339 Encounter for screening examination for other mental health and behavioral disorders: Secondary | ICD-10-CM

## 2022-07-08 MED ORDER — VALACYCLOVIR HCL 1 G PO TABS
ORAL_TABLET | ORAL | 11 refills | Status: DC
Start: 2022-07-08 — End: 2023-10-06

## 2022-07-08 NOTE — Progress Notes (Signed)
57 y.o GYN presents for AEX/PAP.  Pt was recently Dx with stage 4 Lung Cancer that spread to her hip bones

## 2022-07-08 NOTE — Progress Notes (Signed)
Subjective:        Cassidy [name] is a 57 y.o. female here for a routine exam.  Current complaints: Vaginal discharge.    Personal health questionnaire:  Is patient [name], have a family history of breast and/or ovarian cancer: no Is there a family history of uterine cancer diagnosed at age < 65, gastrointestinal cancer, urinary tract cancer, family member who is a Personnel officer syndrome-associated carrier: no Is the patient overweight and hypertensive, family history of diabetes, personal history of gestational diabetes, preeclampsia or PCOS: no Is patient over 1, have PCOS,  family history of premature CHD under age 67, diabetes, smoke, have hypertension or peripheral artery disease:  no At any time, has a partner hit, kicked or otherwise hurt or frightened you?: no Over the past 2 weeks, have you felt down, depressed or hopeless?: no Over the past 2 weeks, have you felt little interest or pleasure in doing things?:no   Gynecologic History Patient's last menstrual period was 06/12/2016 (approximate). Contraception: post menopausal status Last Pap: 2021. Results were: normal Last mammogram: unknown. 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  s/p PCTA to dRCA and ostial RPAV-PLA vessel + PDA branch (05/2012)  b. [place] s/p DES to mLAD - Resolute DES 3.0 x 22 (3.61mm -->3.3 mm)   Bipolar depression (HCC)    Breast abscess    a. right side.    COPD (chronic obstructive pulmonary disease) (HCC)    Depression    Diabetic peripheral neuropathy (HCC)     Fibromyalgia    Genital warts    GERD (gastroesophageal reflux disease)    History of hiatal hernia    HLD (hyperlipidemia)    Hypertension    Lung cancer (HCC)    Pain in limb    a. LE VENOUS DUPLEX, 02/05/2009 - no evidence of deep vein thrombosis, Baker's cyst   Pneumonia    PTSD (post-traumatic stress disorder)    Sickle cell trait (HCC)    Tobacco abuse    Tooth caries    Type II diabetes mellitus (HCC)     Past Surgical History:  Procedure Laterality Date   BLADDER SURGERY  1980   "TVT"   BLADDER SURGERY  2003   "TVT"   BREAST EXCISIONAL BIOPSY     BREAST SURGERY Right    I&D for multiple abscesses   CARDIAC CATHETERIZATION     CORONARY ARTERY BYPASS GRAFT N/A 06/30/2021   Procedure: CORONARY ARTERY BYPASS GRAFTING (CABG) x3 USING LEFT INTERNAL MAMMARY ARTERY,  RIGHT RADIAL ARTERY AND LEFT GREATER SAPHENOUS VEIN;  Surgeon: Corliss Skains, MD;  Location: [facility] OR;  Service: Open Heart Surgery;  Laterality: N/A;   cutting balloon     ENDOVEIN HARVEST OF GREATER SAPHENOUS VEIN Left 06/30/2021   Procedure: ENDOVEIN HARVEST OF GREATER SAPHENOUS VEIN;  Surgeon: Corliss Skains, MD;  Location: [facility]  OR;  Service: Open Heart Surgery;  Laterality: Left;   EYE SURGERY Left    laser surgery   FINE NEEDLE ASPIRATION  08/21/2021   Procedure: FINE NEEDLE ASPIRATION (FNA) LINEAR;  Surgeon: Josephine Igo, DO;  Location: [facility] ENDOSCOPY;  Service: Pulmonary;;   INCISE AND DRAIN ABCESS  ; 04/26/11; 08/13/11   right breast   INCISION AND DRAINAGE ABSCESS Right 05/09/2012   Procedure: INCISION AND DRAINAGE RIGHT BREAST ABSCESS;  Surgeon: Wilmon Arms. Corliss Skains, MD;  Location: [facility] OR;  Service: General;  Laterality: Right;   INCISION AND DRAINAGE ABSCESS Right 02/08/2014   Procedure: INCISION AND DRAINAGE RIGHT BREAST ABSCESS;  Surgeon: Manus Rudd, MD;  Location: [facility] OR;  Service: General;  Laterality: Right;   INCISION AND DRAINAGE ABSCESS Right 06/14/2014   Procedure: INCISION AND DRAINAGE  RIGHT BREAST ABSCESS;  Surgeon: Manus Rudd, MD;  Location: [facility] OR;  Service: General;  Laterality: Right;   IR IMAGING GUIDED PORT INSERTION  12/08/2021   IRRIGATION AND DEBRIDEMENT ABSCESS  12/19/2010   Procedure: IRRIGATION AND DEBRIDEMENT ABSCESS;  Surgeon: Emelia Loron, MD;  Location: [facility] OR;  Service: General;  Laterality: Right;   IRRIGATION AND DEBRIDEMENT ABSCESS  08/13/2011   Procedure: MINOR INCISION AND DRAINAGE OF ABSCESS;  Surgeon: Cherylynn Ridges, MD;  Location: [facility] OR;  Service: General;  Laterality: Right;  Right Breast    IRRIGATION AND DEBRIDEMENT ABSCESS  11/17/2011   Procedure: IRRIGATION AND DEBRIDEMENT ABSCESS;  Surgeon: Wilmon Arms. Corliss Skains, MD;  Location: [facility];  Service: General;  Laterality: Right;  irrigation and debridement right recurrent breast abscess   LARYNX SURGERY     LEFT HEART CATH AND CORONARY ANGIOGRAPHY N/A 04/09/2021   Procedure: LEFT HEART CATH AND CORONARY ANGIOGRAPHY;  Surgeon: Corky Crafts, MD;  Location: [facility] INVASIVE CV LAB;  Service: Cardiovascular;  Laterality: N/A;   LEFT HEART CATHETERIZATION WITH CORONARY ANGIOGRAM N/A 05/29/2012   Procedure: LEFT HEART CATHETERIZATION WITH CORONARY ANGIOGRAM & PTCA;  Surgeon: Lennette Bihari, MD;  Location: [facility] CATH LAB;  Service: Cardiovascular; Bifurcation dRCA-RPAD/PLA & rPDA  PTCA.   LEFT HEART CATHETERIZATION WITH CORONARY ANGIOGRAM N/A 11/08/2013   Procedure: LEFT HEART CATHETERIZATION WITH CORONARY ANGIOGRAM and Coronary Stent Intervention;  Surgeon: Marykay Lex, MD;  Location: [facility] CATH LAB;  Service: Cardiovascular; mLAD 80% (FFR 0.73) - resolute DES 3.0 x 22 mm (postdilated to 3.3 mm->3.5 mm)   NM MYOVIEW LTD  01/26/2009   Normal study, no evidence of ischemia, EF 67%   ployp removed from voice box 03/30/12     RADIAL ARTERY HARVEST Right 06/30/2021   Procedure: RADIAL ARTERY HARVEST;  Surgeon: Corliss Skains, MD;  Location: [facility];  Service: Open Heart Surgery;  Laterality: Right;    REFRACTIVE SURGERY  ~ 2010   right   TEE WITHOUT CARDIOVERSION N/A 06/30/2021   Procedure: TRANSESOPHAGEAL ECHOCARDIOGRAM (TEE);  Surgeon: Corliss Skains, MD;  Location: [facility] OR;  Service: Open Heart Surgery;  Laterality: N/A;   TONSILLECTOMY  1988   VIDEO BRONCHOSCOPY WITH ENDOBRONCHIAL ULTRASOUND Bilateral 08/21/2021   Procedure: VIDEO BRONCHOSCOPY WITH ENDOBRONCHIAL ULTRASOUND;  Surgeon: Josephine Igo, DO;  Location: [facility];  Service: Pulmonary;  Laterality: Bilateral;     Current Outpatient Medications:    aspirin EC 81 MG tablet, Take 81 mg by mouth daily., Disp: , Rfl:    atorvastatin (LIPITOR) 40 MG tablet, TAKE 1 TABLET (40 MG TOTAL) BY MOUTH DAILY., Disp: 90 tablet, Rfl: 3   busPIRone (BUSPAR) 15  MG tablet, Take 0.5 tablets (7.5 mg total) by mouth 2 (two) times daily., Disp: 60 tablet, Rfl: 3   Carboxymethylcellul-Glycerin (LUBRICATING EYE DROPS OP), Place 1 drop into both eyes daily as needed (dry eyes)., Disp: , Rfl:    carvedilol (COREG) 12.5 MG tablet, TAKE 1 TABLET (12.5 MG TOTAL) BY MOUTH 2 (TWO) TIMES DAILY WITH A MEAL., Disp: 180 tablet, Rfl: 1   diclofenac Sodium (VOLTAREN) 1 % GEL, APPLY 1 APPLICATION TOPICALLY 3 TIMES DAILY AS NEEDED FOR PAIN, Disp: 200 g, Rfl: 1   DULoxetine (CYMBALTA) 30 MG capsule, Take 1 capsule (30 mg total) by mouth daily., Disp: 30 capsule, Rfl: 5   ferrous sulfate 325 (65 FE) MG tablet, Take 325 mg by mouth daily with breakfast., Disp: , Rfl:    fluticasone (FLONASE) 50 MCG/ACT nasal spray, PLACE 1 SPRAY INTO BOTH NOSTRILS DAILY AS NEEDED (CONGESTION)., Disp: 16 g, Rfl: 1   fluticasone-salmeterol (ADVAIR) 100-50 MCG/ACT AEPB, Inhale 1 puff into the lungs 2 (two) times daily., Disp: 1 each, Rfl: 3   folic acid (FOLVITE) 1 MG tablet, Take 1 tablet (1 mg total) by mouth daily., Disp: 30 tablet, Rfl: 3   furosemide (LASIX) 40 MG tablet, Take 1 tablet (40 mg total) by mouth daily as needed. If you gain 3 lbs in 24 hours or 5 lbs in 1 week, Disp:  30 tablet, Rfl: 1   glucose blood (ACCU-CHEK AVIVA PLUS) test strip, Use as instructed, Disp: 50 each, Rfl: 1   hyoscyamine (LEVSIN SL) 0.125 MG SL tablet, Place 1 tablet (0.125 mg total) under the tongue every 6 (six) hours as needed. For abdominal cramping, Disp: 30 tablet, Rfl: 0   insulin glargine (LANTUS) 100 UNIT/ML Solostar Pen, Inject 24 Units into the skin every morning., Disp: 3 mL, Rfl: 11   Insulin Pen Needle 32G X 4 MM MISC, 1 Needle by Does not apply route in the morning and at bedtime., Disp: 120 each, Rfl: 2   ipratropium (ATROVENT HFA) 17 MCG/ACT inhaler, Inhale 2 puffs into the lungs every 4 (four) hours as needed for wheezing (COPD)., Disp: , Rfl:    lactulose (CHRONULAC) 10 GM/15ML solution, Take 30 mLs (20 g total) by mouth 2 (two) times daily as needed for mild constipation, moderate constipation or severe constipation., Disp: 236 mL, Rfl: 0   lidocaine-prilocaine (EMLA) cream, APPLY 1 APPLICATION TOPICALLY AS NEEDED., Disp: 30 g, Rfl: 2   LORazepam (ATIVAN) 0.5 MG tablet, Take 1 tablet (0.5 mg total) by mouth every 8 (eight) hours as needed for anxiety (nausea)., Disp: 30 tablet, Rfl: 0   metoCLOPramide (REGLAN) 10 MG tablet, Take 1 tablet (10 mg total) by mouth 3 (three) times daily before meals., Disp: 60 tablet, Rfl: 0   nicotine polacrilex (NICORETTE) 2 MG gum, Take 1 each (2 mg total) by mouth as needed for smoking cessation., Disp: 100 tablet, Rfl: 0   nitroGLYCERIN (NITROSTAT) 0.4 MG SL tablet, Place 1 tablet (0.4 mg total) under the tongue every 5 (five) minutes as needed for chest pain., Disp: 25 tablet, Rfl: 5   nystatin (MYCOSTATIN) 100000 UNIT/ML suspension, Take 5 mLs (500,000 Units total) by mouth 4 (four) times daily., Disp: 60 mL, Rfl: 0   ondansetron (ZOFRAN-ODT) 8 MG disintegrating tablet, Take 1 tablet (8 mg total) by mouth every 8 (eight) hours as needed for nausea or vomiting., Disp: 30 tablet, Rfl: 2   oxyCODONE (OXY IR/ROXICODONE) 5 MG immediate release  tablet, Take 1-2 tablets (5-10 mg total) by  mouth every 4 (four) hours as needed for severe pain., Disp: 90 tablet, Rfl: 0   oxyCODONE ER (XTAMPZA ER) 13.5 MG C12A, Take 13.5 mg by mouth every 12 (twelve) hours., Disp: 60 capsule, Rfl: 0   pantoprazole (PROTONIX) 40 MG tablet, Take 1 tablet (40 mg total) by mouth 2 (two) times daily., Disp: 60 tablet, Rfl: 5   polyethylene glycol powder (GAVILAX) 17 GM/SCOOP powder, TAKE ONE CAPFUL (17 GRAMS )BY MOUTH DAILY AS NEEDED FOR MILD OR MODERATE CONSTIPATION., Disp: 510 g, Rfl: 1   potassium chloride SA (KLOR-CON M) 20 MEQ tablet, Take 1 tablet (20 mEq total) by mouth daily as needed. On days you take Lasix, Disp: 30 tablet, Rfl: 1   prochlorperazine (COMPAZINE) 10 MG tablet, TAKE 1 TABLET (10 MG TOTAL) BY MOUTH EVERY 6 (SIX) HOURS AS NEEDED FOR NAUSEA OR VOMITING., Disp: 30 tablet, Rfl: 0   valACYclovir (VALTREX) 1000 MG tablet, Take for 3 days prn each outbreak., Disp: 30 tablet, Rfl: 11   vitamin B-12 (CYANOCOBALAMIN) 1000 MCG tablet, Take 1,000 mcg by mouth daily., Disp: , Rfl:  Allergies  Allergen Reactions   Amoxil [Amoxicillin] Hives    Has patient had a PCN reaction causing immediate rash, facial/tongue/throat swelling, SOB or lightheadedness with hypotension: Yes Has patient had a PCN reaction causing severe rash involving mucus membranes or skin necrosis: No Has patient had a PCN reaction that required hospitalization No Has patient had a PCN reaction occurring within the last 10 years: Yes If all of the above answers are "NO", then may proceed with Cephalosporin use.   Chantix [Varenicline] Other (See Comments)    Nightmares Hallucinations     Suboxone [Buprenorphine Hcl-Naloxone Hcl] Nausea And Vomiting   Emend [Fosaprepitant Dimeglumine] Itching    See notes for transfusion on 8/23. Pt reported new onset of itching after Emend infusion was started. 25mg  Benadryl IV given per MD. Pt tolerated remaining infusion well.     Social History    Tobacco Use   Smoking status: Former    Packs/day: 0.50    Years: 42.00    Additional pack years: 0.00    Total pack years: 21.00    Types: Cigarettes    Start date: 01/12/1979   Smokeless tobacco: Never   Tobacco comments:    08/14/11 "quit for 3 wk 05/2011; was smoking 1 ppd since age 68 before I quit; at least I've cut down to 1/4 ppd",  Smoking 3 per day 08/20/2021. 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, marijuana, crack cocaine)    Family History  Problem Relation Age of Onset   Esophageal cancer Mother    COPD Mother    Cancer Mother        lymphoma   Heart disease Mother    Stomach cancer Mother    Hyperlipidemia Father    Heart disease Father    Cancer Maternal Aunt        breast, colon   Crohn's disease Maternal Aunt    Hypertension Maternal Grandmother    Diabetes Maternal Grandmother    Hypertension Brother    Rectal cancer Neg Hx    Liver cancer Neg Hx    Colon cancer Neg Hx    Breast cancer Neg Hx       Review of Systems  Constitutional: negative for fatigue  and weight loss Respiratory: negative for cough and wheezing Cardiovascular: negative for chest pain, fatigue and palpitations Gastrointestinal: negative for abdominal pain and change in bowel habits Musculoskeletal:negative for myalgias Neurological: negative for gait problems and tremors Behavioral/Psych: negative for abusive relationship, depression Endocrine: negative for temperature intolerance    Genitourinary:positive for vaginal discharge.  negative for abnormal menstrual periods, genital lesions, hot flashes, sexual problems  Integument/breast: negative for breast lump, breast tenderness, nipple discharge and skin lesion(s)    Objective:       BP (!) 83/59   Pulse 74   Ht 5' 7.5" (1.715 m)   Wt 184 lb (83.5 kg)   LMP 06/12/2016 (Approximate)    BMI 28.39 kg/m  General:   Alert and no distress  Skin:   no rash or abnormalities  Lungs:   clear to auscultation bilaterally  Heart:   regular rate and rhythm, S1, S2 normal, no murmur, click, rub or gallop  Breasts:   normal without suspicious masses, skin or nipple changes or axillary nodes  Abdomen:  normal findings: no organomegaly, soft, non-tender and no hernia  Pelvis:  External genitalia: normal general appearance Urinary system: urethral meatus normal and bladder without fullness, nontender Vaginal: normal without tenderness, induration or masses Cervix: normal appearance Adnexa: normal bimanual exam Uterus: anteverted and non-tender, normal size   Lab Review Urine pregnancy test Labs reviewed yes Radiologic studies reviewed yes  I have spent a total of 20 minutes of face-to-face and non-face-to-face time, excluding clinical staff time, reviewing notes and preparing to see patient, ordering tests and/or medications, and counseling the patient.   Assessment:    1. Women's annual routine gynecological examination [Z01.419] Rx: - Cytology - PAP( [place])  2. Vaginal discharge Rx: - Cervicovaginal ancillary only( [place])  3. Screen for STD (sexually transmitted disease) Rx: - HIV Antibody (routine testing w rflx) - Hepatitis B surface antigen - RPR - Hepatitis C antibody  4. Screening breast examination Rx: - MM Digital Screening; Future  5. Herpes simplex infection of genitourinary system Rx: - valACYclovir (VALTREX) 1000 MG tablet; Take for 3 days prn each outbreak.  Dispense: 30 tablet; Refill: 11  6. Primary malignant neoplasm of lung metastatic to other site, unspecified laterality (HCC)  7. Tobacco abuse - cessation recommended     Plan:    Education reviewed: calcium supplements, depression evaluation, low fat, low cholesterol diet, safe sex/STD prevention, self breast exams, smoking cessation, and weight bearing exercise. Follow up in:  1 year.   Meds ordered this encounter  Medications   valACYclovir (VALTREX) 1000 MG tablet    Sig: Take for 3 days prn each outbreak.    Dispense:  30 tablet    Refill:  11   Orders Placed This Encounter  Procedures   HIV Antibody (routine testing w rflx)   Hepatitis B surface antigen   RPR   Hepatitis C antibody     Brock Bad, MD 07/08/2022 12:10 PM

## 2022-07-09 LAB — CERVICOVAGINAL ANCILLARY ONLY
Bacterial Vaginitis (gardnerella): POSITIVE — AB
Candida Glabrata: NEGATIVE
Candida Vaginitis: NEGATIVE
Chlamydia: NEGATIVE
Comment: NEGATIVE
Comment: NEGATIVE
Comment: NEGATIVE
Comment: NEGATIVE
Comment: NEGATIVE
Comment: NORMAL
Neisseria Gonorrhea: NEGATIVE
Trichomonas: NEGATIVE

## 2022-07-09 LAB — HEPATITIS B SURFACE ANTIGEN: Hepatitis B Surface Ag: NEGATIVE

## 2022-07-09 LAB — RPR: RPR Ser Ql: NONREACTIVE

## 2022-07-09 LAB — HEPATITIS C ANTIBODY: Hep C Virus Ab: NONREACTIVE

## 2022-07-09 LAB — HIV ANTIBODY (ROUTINE TESTING W REFLEX): HIV Screen 4th Generation wRfx: NONREACTIVE

## 2022-07-12 NOTE — Progress Notes (Unsigned)
Palliative Medicine [facility]  [phone] [phone]) [phone]   Name: Cassidy [name] Date: 07/12/2022 MRN: [id number]  DOB: 1965/08/14  Patient Care Team: [name], MD as PCP - General (Family Medicine) O'Neal, Ronnald Ramp, MD as PCP - Cardiology (Cardiology) Emelia Loron, MD as Consulting Physician (General Surgery)    INTERVAL HISTORY: Cassidy [name] is a 57 y.o. 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  Palliative ask to see for symptom management and goals of care.   SOCIAL HISTORY:    [name] reports that she has quit smoking. Her smoking use included cigarettes. She started smoking about 43 years ago. She has a 21.00 pack-year smoking history. She has never used smokeless tobacco. She reports that she does not currently use drugs. She reports that she does not drink alcohol.  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  s/p PCTA to dRCA and ostial RPAV-PLA vessel + PDA branch (05/2012)  b. [place] s/p DES to mLAD - Resolute DES 3.0 x 22 (3.71mm -->3.3 mm)   Bipolar depression (HCC)    Breast abscess    a. right side.    COPD (chronic obstructive pulmonary disease) (HCC)    Depression    Diabetic peripheral neuropathy (HCC)    Fibromyalgia    Genital warts    GERD (gastroesophageal reflux disease)    History of hiatal hernia    HLD (hyperlipidemia)    Hypertension    Lung cancer (HCC)    Pain in limb    a. LE VENOUS DUPLEX, 02/05/2009 - no evidence of deep vein thrombosis, Baker's cyst    Pneumonia    PTSD (post-traumatic stress disorder)    Sickle cell trait (HCC)    Tobacco abuse    Tooth caries    Type II diabetes mellitus (HCC)     ALLERGIES:  is allergic to amoxil [amoxicillin], chantix [varenicline], suboxone [buprenorphine hcl-naloxone hcl], and emend [fosaprepitant dimeglumine].  MEDICATIONS:  Current Outpatient Medications  Medication Sig Dispense Refill   aspirin EC 81 MG tablet Take 81 mg by mouth daily.     atorvastatin (LIPITOR) 40 MG tablet TAKE 1 TABLET (40 MG TOTAL) BY MOUTH DAILY. 90 tablet 3   busPIRone (BUSPAR) 15 MG tablet Take 0.5 tablets (7.5 mg total) by mouth 2 (two) times daily. 60 tablet 3   Carboxymethylcellul-Glycerin (LUBRICATING EYE DROPS OP) Place 1 drop into both eyes daily as needed (dry eyes).     carvedilol (COREG) 12.5 MG tablet TAKE 1 TABLET (12.5 MG TOTAL) BY MOUTH 2 (TWO) TIMES DAILY WITH A MEAL. 180 tablet 1   diclofenac Sodium (VOLTAREN) 1 % GEL APPLY 1 APPLICATION TOPICALLY 3 TIMES DAILY AS NEEDED FOR PAIN 200 g 1   DULoxetine (CYMBALTA) 30 MG capsule Take 1 capsule (30 mg total) by mouth daily. 30 capsule 5   ferrous sulfate 325 (65 FE) MG tablet Take 325 mg by mouth daily with breakfast.     fluticasone (FLONASE) 50 MCG/ACT nasal spray PLACE 1 SPRAY INTO BOTH NOSTRILS DAILY AS NEEDED (CONGESTION). 16 g 1  fluticasone-salmeterol (ADVAIR) 100-50 MCG/ACT AEPB Inhale 1 puff into the lungs 2 (two) times daily. 1 each 3   folic acid (FOLVITE) 1 MG tablet Take 1 tablet (1 mg total) by mouth daily. 30 tablet 3   furosemide (LASIX) 40 MG tablet Take 1 tablet (40 mg total) by mouth daily as needed. If you gain 3 lbs in 24 hours or 5 lbs in 1 week 30 tablet 1   glucose blood (ACCU-CHEK AVIVA PLUS) test strip Use as instructed 50 each 1   hyoscyamine (LEVSIN SL) 0.125 MG SL tablet Place 1 tablet (0.125 mg total) under the tongue every 6 (six) hours as needed. For abdominal cramping 30 tablet 0   insulin glargine (LANTUS) 100 UNIT/ML  Solostar Pen Inject 24 Units into the skin every morning. 3 mL 11   Insulin Pen Needle 32G X 4 MM MISC 1 Needle by Does not apply route in the morning and at bedtime. 120 each 2   ipratropium (ATROVENT HFA) 17 MCG/ACT inhaler Inhale 2 puffs into the lungs every 4 (four) hours as needed for wheezing (COPD).     lactulose (CHRONULAC) 10 GM/15ML solution Take 30 mLs (20 g total) by mouth 2 (two) times daily as needed for mild constipation, moderate constipation or severe constipation. 236 mL 0   lidocaine-prilocaine (EMLA) cream APPLY 1 APPLICATION TOPICALLY AS NEEDED. 30 g 2   LORazepam (ATIVAN) 0.5 MG tablet Take 1 tablet (0.5 mg total) by mouth every 8 (eight) hours as needed for anxiety (nausea). 30 tablet 0   metoCLOPramide (REGLAN) 10 MG tablet Take 1 tablet (10 mg total) by mouth 3 (three) times daily before meals. 60 tablet 0   nicotine polacrilex (NICORETTE) 2 MG gum Take 1 each (2 mg total) by mouth as needed for smoking cessation. 100 tablet 0   nitroGLYCERIN (NITROSTAT) 0.4 MG SL tablet Place 1 tablet (0.4 mg total) under the tongue every 5 (five) minutes as needed for chest pain. 25 tablet 5   nystatin (MYCOSTATIN) 100000 UNIT/ML suspension Take 5 mLs (500,000 Units total) by mouth 4 (four) times daily. 60 mL 0   ondansetron (ZOFRAN-ODT) 8 MG disintegrating tablet Take 1 tablet (8 mg total) by mouth every 8 (eight) hours as needed for nausea or vomiting. 30 tablet 2   oxyCODONE (OXY IR/ROXICODONE) 5 MG immediate release tablet Take 1-2 tablets (5-10 mg total) by mouth every 4 (four) hours as needed for severe pain. 90 tablet 0   oxyCODONE ER (XTAMPZA ER) 13.5 MG C12A Take 13.5 mg by mouth every 12 (twelve) hours. 60 capsule 0   pantoprazole (PROTONIX) 40 MG tablet Take 1 tablet (40 mg total) by mouth 2 (two) times daily. 60 tablet 5   polyethylene glycol powder (GAVILAX) 17 GM/SCOOP powder TAKE ONE CAPFUL (17 GRAMS )BY MOUTH DAILY AS NEEDED FOR MILD OR MODERATE CONSTIPATION. 510 g 1    potassium chloride SA (KLOR-CON M) 20 MEQ tablet Take 1 tablet (20 mEq total) by mouth daily as needed. On days you take Lasix 30 tablet 1   prochlorperazine (COMPAZINE) 10 MG tablet TAKE 1 TABLET (10 MG TOTAL) BY MOUTH EVERY 6 (SIX) HOURS AS NEEDED FOR NAUSEA OR VOMITING. 30 tablet 0   valACYclovir (VALTREX) 1000 MG tablet Take for 3 days prn each outbreak. 30 tablet 11   vitamin B-12 (CYANOCOBALAMIN) 1000 MCG tablet Take 1,000 mcg by mouth daily.     No current facility-administered medications for this visit.    VITAL SIGNS: LMP 06/12/2016 (Approximate)  There were no vitals filed for this visit.  Estimated body mass index is 28.39 kg/m as calculated from the following:   Height as of 07/08/22: 5' 7.5" (1.715 m).   Weight as of 07/08/22: 184 lb (83.5 kg).   PERFORMANCE STATUS (ECOG) : 1 - Symptomatic but completely ambulatory   Physical Exam: General: NAD Pulmonary: normal breathing pattern  Extremities: pedal edema, no joint deformities Skin: no rashes Neurological: alert and oriented x 4, mood and affect appropriate   IMPRESSION: .  I saw Ms. Packman today during her infusion. She continues to struggle with lower extremity swelling (specifically right ankle/foot) which makes it difficult for her to put on her shoes and walk at times. Occasional fatigue. Recent visit with GI. Reports no signs of blockage which she is appreciative of. Denies nausea, vomiting, diarrhea.   Neoplasm related pain Ms. Wands reports that her pain is well controlled on current regimen. She is taking Xtampza 13 mg every 12 hours and Oxycodone 5-10 mg every 4 hours as needed for breakthrough pain. Does not require around the clock.   She has started Cymbalta. Tolerating without unwanted side effects.   Is also using topical Voltaren to hip area for aches during the evening and night time hours.   2.  Nausea Controlled on regimen.   3. Constipation  Better controlled with regimen. Reports GI made some  adjustments to regimen.   We discussed the importance of continued conversation with family and their medical providers regarding overall plan of care and treatment options, ensuring decisions are within the context of the patients values and GOCs.  PLAN: Xtampza 13 mg every 12 hours. Oxycodone IR 5-10 mg every 6 hours as needed for breakthrough pain. Senna-S and Miralax on a daily basis for the management of constipation. Zofran, Compazine, and Ativan as needed for nausea Ongoing emotional support and symptom management. Palliative will plan to see patient back in 3-4 weeks in collaboration with her oncology appointments.   Patient expressed understanding and was in agreement with this plan. She also understands that She can call the clinic at any time with any questions, concerns, or complaints.    Any controlled substances utilized were prescribed in the context of palliative care. PDMP has been reviewed.   Any controlled substances utilized were prescribed in the context of palliative care. PDMP has been reviewed.    Visit consisted of counseling and education dealing with the complex and emotionally intense issues of symptom management and palliative care in the setting of serious and potentially life-threatening illness.Greater than 50%  of this time was spent counseling and coordinating care related to the above assessment and plan.  Willette Alma, AGPCNP-BC  Palliative Medicine Team/[facility]  *Please note that this is a verbal dictation therefore any spelling or grammatical errors are due to the "[employer]" system interpretation.

## 2022-07-13 LAB — CYTOLOGY - PAP
Comment: NEGATIVE
Diagnosis: UNDETERMINED — AB
High risk HPV: NEGATIVE

## 2022-07-14 ENCOUNTER — Encounter: Payer: Self-pay | Admitting: Nurse Practitioner

## 2022-07-14 ENCOUNTER — Inpatient Hospital Stay (HOSPITAL_BASED_OUTPATIENT_CLINIC_OR_DEPARTMENT_OTHER): Payer: Medicaid Other | Admitting: Nurse Practitioner

## 2022-07-14 ENCOUNTER — Inpatient Hospital Stay: Payer: Medicaid Other

## 2022-07-14 ENCOUNTER — Other Ambulatory Visit: Payer: Self-pay

## 2022-07-14 ENCOUNTER — Inpatient Hospital Stay: Payer: Medicaid Other | Attending: Internal Medicine

## 2022-07-14 ENCOUNTER — Inpatient Hospital Stay: Payer: Medicaid Other | Admitting: Internal Medicine

## 2022-07-14 VITALS — BP 133/77 | HR 69 | Temp 97.7°F | Resp 17 | Ht 67.5 in | Wt 188.4 lb

## 2022-07-14 DIAGNOSIS — C3491 Malignant neoplasm of unspecified part of right bronchus or lung: Secondary | ICD-10-CM

## 2022-07-14 DIAGNOSIS — R5383 Other fatigue: Secondary | ICD-10-CM | POA: Insufficient documentation

## 2022-07-14 DIAGNOSIS — Z79620 Long term (current) use of immunosuppressive biologic: Secondary | ICD-10-CM | POA: Diagnosis not present

## 2022-07-14 DIAGNOSIS — R601 Generalized edema: Secondary | ICD-10-CM | POA: Insufficient documentation

## 2022-07-14 DIAGNOSIS — Z515 Encounter for palliative care: Secondary | ICD-10-CM | POA: Diagnosis not present

## 2022-07-14 DIAGNOSIS — I3139 Other pericardial effusion (noninflammatory): Secondary | ICD-10-CM | POA: Insufficient documentation

## 2022-07-14 DIAGNOSIS — Z87891 Personal history of nicotine dependence: Secondary | ICD-10-CM | POA: Insufficient documentation

## 2022-07-14 DIAGNOSIS — M7989 Other specified soft tissue disorders: Secondary | ICD-10-CM | POA: Insufficient documentation

## 2022-07-14 DIAGNOSIS — R530 Neoplastic (malignant) related fatigue: Secondary | ICD-10-CM | POA: Diagnosis not present

## 2022-07-14 DIAGNOSIS — E119 Type 2 diabetes mellitus without complications: Secondary | ICD-10-CM | POA: Diagnosis not present

## 2022-07-14 DIAGNOSIS — G893 Neoplasm related pain (acute) (chronic): Secondary | ICD-10-CM | POA: Diagnosis not present

## 2022-07-14 DIAGNOSIS — Z5111 Encounter for antineoplastic chemotherapy: Secondary | ICD-10-CM | POA: Insufficient documentation

## 2022-07-14 DIAGNOSIS — R110 Nausea: Secondary | ICD-10-CM | POA: Insufficient documentation

## 2022-07-14 DIAGNOSIS — C3411 Malignant neoplasm of upper lobe, right bronchus or lung: Secondary | ICD-10-CM | POA: Diagnosis present

## 2022-07-14 DIAGNOSIS — C787 Secondary malignant neoplasm of liver and intrahepatic bile duct: Secondary | ICD-10-CM | POA: Diagnosis not present

## 2022-07-14 DIAGNOSIS — Z79631 Long term (current) use of antimetabolite agent: Secondary | ICD-10-CM | POA: Diagnosis not present

## 2022-07-14 DIAGNOSIS — D3501 Benign neoplasm of right adrenal gland: Secondary | ICD-10-CM | POA: Diagnosis not present

## 2022-07-14 DIAGNOSIS — Z923 Personal history of irradiation: Secondary | ICD-10-CM | POA: Insufficient documentation

## 2022-07-14 DIAGNOSIS — K5900 Constipation, unspecified: Secondary | ICD-10-CM | POA: Diagnosis not present

## 2022-07-14 DIAGNOSIS — E041 Nontoxic single thyroid nodule: Secondary | ICD-10-CM | POA: Diagnosis not present

## 2022-07-14 DIAGNOSIS — C3490 Malignant neoplasm of unspecified part of unspecified bronchus or lung: Secondary | ICD-10-CM | POA: Diagnosis not present

## 2022-07-14 DIAGNOSIS — Z880 Allergy status to penicillin: Secondary | ICD-10-CM | POA: Insufficient documentation

## 2022-07-14 DIAGNOSIS — I10 Essential (primary) hypertension: Secondary | ICD-10-CM | POA: Diagnosis not present

## 2022-07-14 DIAGNOSIS — R188 Other ascites: Secondary | ICD-10-CM | POA: Insufficient documentation

## 2022-07-14 DIAGNOSIS — J439 Emphysema, unspecified: Secondary | ICD-10-CM | POA: Diagnosis not present

## 2022-07-14 DIAGNOSIS — Z79899 Other long term (current) drug therapy: Secondary | ICD-10-CM | POA: Diagnosis not present

## 2022-07-14 DIAGNOSIS — Z5112 Encounter for antineoplastic immunotherapy: Secondary | ICD-10-CM | POA: Diagnosis present

## 2022-07-14 DIAGNOSIS — Z8774 Personal history of (corrected) congenital malformations of heart and circulatory system: Secondary | ICD-10-CM | POA: Insufficient documentation

## 2022-07-14 DIAGNOSIS — Z95828 Presence of other vascular implants and grafts: Secondary | ICD-10-CM

## 2022-07-14 DIAGNOSIS — I700 Atherosclerosis of aorta: Secondary | ICD-10-CM | POA: Insufficient documentation

## 2022-07-14 DIAGNOSIS — Z9089 Acquired absence of other organs: Secondary | ICD-10-CM | POA: Insufficient documentation

## 2022-07-14 LAB — CBC WITH DIFFERENTIAL (CANCER CENTER ONLY)
Abs Immature Granulocytes: 0.02 10*3/uL (ref 0.00–0.07)
Basophils Absolute: 0 10*3/uL (ref 0.0–0.1)
Basophils Relative: 1 %
Eosinophils Absolute: 0 10*3/uL (ref 0.0–0.5)
Eosinophils Relative: 1 %
HCT: 24.5 % — ABNORMAL LOW (ref 36.0–46.0)
Hemoglobin: 8.3 g/dL — ABNORMAL LOW (ref 12.0–15.0)
Immature Granulocytes: 1 %
Lymphocytes Relative: 19 %
Lymphs Abs: 0.8 10*3/uL (ref 0.7–4.0)
MCH: 35.5 pg — ABNORMAL HIGH (ref 26.0–34.0)
MCHC: 33.9 g/dL (ref 30.0–36.0)
MCV: 104.7 fL — ABNORMAL HIGH (ref 80.0–100.0)
Monocytes Absolute: 0.7 10*3/uL (ref 0.1–1.0)
Monocytes Relative: 16 %
Neutro Abs: 2.8 10*3/uL (ref 1.7–7.7)
Neutrophils Relative %: 62 %
Platelet Count: 306 10*3/uL (ref 150–400)
RBC: 2.34 MIL/uL — ABNORMAL LOW (ref 3.87–5.11)
RDW: 15.6 % — ABNORMAL HIGH (ref 11.5–15.5)
WBC Count: 4.3 10*3/uL (ref 4.0–10.5)
nRBC: 0 % (ref 0.0–0.2)

## 2022-07-14 LAB — CMP (CANCER CENTER ONLY)
ALT: 15 U/L (ref 0–44)
AST: 20 U/L (ref 15–41)
Albumin: 3.3 g/dL — ABNORMAL LOW (ref 3.5–5.0)
Alkaline Phosphatase: 57 U/L (ref 38–126)
Anion gap: 4 — ABNORMAL LOW (ref 5–15)
BUN: 10 mg/dL (ref 6–20)
CO2: 28 mmol/L (ref 22–32)
Calcium: 8.5 mg/dL — ABNORMAL LOW (ref 8.9–10.3)
Chloride: 107 mmol/L (ref 98–111)
Creatinine: 1.02 mg/dL — ABNORMAL HIGH (ref 0.44–1.00)
GFR, Estimated: >60 mL/min (ref >60)
Glucose, Bld: 81 mg/dL (ref 70–99)
Potassium: 3.4 mmol/L — ABNORMAL LOW (ref 3.5–5.1)
Sodium: 139 mmol/L (ref 135–145)
Total Bilirubin: 0.5 mg/dL (ref 0.3–1.2)
Total Protein: 6 g/dL — ABNORMAL LOW (ref 6.5–8.1)

## 2022-07-14 MED ORDER — SODIUM CHLORIDE 0.9 % IV SOLN
200.0000 mg | Freq: Once | INTRAVENOUS | Status: AC
  Administered 2022-07-14: 200 mg via INTRAVENOUS
  Filled 2022-07-14: qty 200

## 2022-07-14 MED ORDER — SODIUM CHLORIDE 0.9 % IV SOLN: Freq: Once | INTRAVENOUS | Status: AC

## 2022-07-14 MED ORDER — HEPARIN SOD (PORK) LOCK FLUSH 100 UNIT/ML IV SOLN: 250.0000 [IU] | Freq: Once | INTRAVENOUS | Status: DC | PRN

## 2022-07-14 MED ORDER — XTAMPZA ER 13.5 MG PO C12A
13.5000 mg | EXTENDED_RELEASE_CAPSULE | Freq: Two times a day (BID) | ORAL | 0 refills | Status: DC
Start: 2022-07-14 — End: 2022-08-12

## 2022-07-14 MED ORDER — SODIUM CHLORIDE 0.9% FLUSH: 10.0000 mL | INTRAVENOUS | Status: DC | PRN

## 2022-07-14 MED ORDER — SODIUM CHLORIDE 0.9% FLUSH
10.0000 mL | Freq: Once | INTRAVENOUS | Status: AC
  Administered 2022-07-14: 10 mL

## 2022-07-14 MED ORDER — PROCHLORPERAZINE MALEATE 10 MG PO TABS
10.0000 mg | ORAL_TABLET | Freq: Once | ORAL | Status: AC
  Administered 2022-07-14: 10 mg via ORAL
  Filled 2022-07-14: qty 1

## 2022-07-14 MED ORDER — SODIUM CHLORIDE 0.9 % IV SOLN
400.0000 mg/m2 | Freq: Once | INTRAVENOUS | Status: AC
  Administered 2022-07-14: 800 mg via INTRAVENOUS
  Filled 2022-07-14: qty 20

## 2022-07-14 MED ORDER — OXYCODONE HCL 5 MG PO TABS
5.0000 mg | ORAL_TABLET | ORAL | 0 refills | Status: DC | PRN
Start: 2022-07-14 — End: 2022-08-12

## 2022-07-14 NOTE — Progress Notes (Signed)
[facility] [phone]   [phone]  OFFICE PROGRESS NOTE  [name], DO [address] [place] [address]  DIAGNOSIS: stage IV (T2a, N2, M1c) non-small cell lung cancer, adenocarcinoma presented with right upper lobe lung mass in addition to right hilar and mediastinal lymphadenopathy and innumerable bilateral pulmonary nodules as well as liver and bone metastasis diagnosed in August 2023.   Detected Alteration(s) / Biomarker(s) Associated FDA-approved therapies Clinical Trial Availability % cfDNA or Amplification  KRAS G12C  approved by FDA Adagrasib, Sotorasib Yes 5.3%  IDH1 R132L  approved in other indication Ivosidenib, Olutasidenib Yes 1.9%  PD-L1 expression 89%  PRIOR THERAPY: None  CURRENT THERAPY: Systemic chemotherapy with carboplatin for AUC of 5, Alimta 500 Mg/M2 and Keytruda 200 Mg IV every 3 weeks.  First dose September 02, 2021.  Status post 15 cycles.  Starting from cycle #5 she is on maintenance treatment with Alimta and Keytruda every 3 weeks.  INTERVAL HISTORY: Cassidy [name] 57 y.o. female turns to the clinic today for follow-up visit.  The patient is feeling fine today with no concerning complaints except for the swelling in the lower extremities and she is currently on Lasix and potassium supplements.  She denied having any current chest pain, shortness of breath, cough or hemoptysis.  She has no nausea, vomiting, diarrhea or constipation.  She has no headache or visual changes.  She has no recent weight loss or night sweats.  She is here today for evaluation before starting cycle #16.  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  s/p PCTA to dRCA and ostial RPAV-PLA vessel + PDA branch (05/2012)  b. [place]  s/p DES to mLAD - Resolute DES 3.0 x 22 (3.29mm -->3.3 mm)   Bipolar depression (HCC)    Breast abscess    a. right side.    COPD (chronic obstructive pulmonary disease) (HCC)    Depression    Diabetic peripheral neuropathy (HCC)    Fibromyalgia    Genital warts    GERD (gastroesophageal reflux disease)    History of hiatal hernia    HLD (hyperlipidemia)    Hypertension    Pain in limb    a. LE VENOUS DUPLEX, 02/05/2009 - no evidence of deep vein thrombosis, Baker's cyst   Pneumonia    PTSD (post-traumatic stress disorder)    Sickle cell trait (HCC)    Tobacco abuse    Tooth caries    Type II diabetes mellitus (HCC)     ALLERGIES:  is allergic to amoxil [amoxicillin], chantix [varenicline], suboxone [buprenorphine hcl-naloxone hcl], and emend [fosaprepitant dimeglumine].  MEDICATIONS:  Current Outpatient Medications  Medication Sig Dispense Refill   aspirin EC 81 MG tablet Take 81 mg by mouth daily.     atorvastatin (LIPITOR) 40 MG tablet TAKE 1 TABLET (40 MG TOTAL) BY MOUTH DAILY. 90 tablet 3   busPIRone (BUSPAR) 15 MG tablet Take 0.5 tablets (7.5 mg total) by mouth 2 (two) times daily. 60 tablet 3   Carboxymethylcellul-Glycerin (LUBRICATING EYE DROPS OP) Place 1 drop into both eyes daily as needed (dry eyes).     carvedilol (COREG) 12.5 MG tablet TAKE 1 TABLET (12.5 MG TOTAL) BY MOUTH 2 (TWO) TIMES DAILY WITH A MEAL. (Patient  not taking: Reported on 06/01/2022) 180 tablet 1   diclofenac Sodium (VOLTAREN) 1 % GEL Apply 1 application topically 3 times daily as needed for pain 2 g 3   ferrous sulfate 325 (65 FE) MG tablet Take 325 mg by mouth daily with breakfast.     fluticasone (FLONASE) 50 MCG/ACT nasal spray PLACE 1 SPRAY INTO BOTH NOSTRILS DAILY AS NEEDED (CONGESTION). 16 g 1   fluticasone-salmeterol (ADVAIR) 100-50 MCG/ACT AEPB Inhale 1 puff into the lungs 2 (two) times daily. 1 each 3   folic acid (FOLVITE) 1 MG tablet Take 1 tablet (1 mg total) by mouth daily. 30 tablet 3    furosemide (LASIX) 40 MG tablet Take 1 tablet (40 mg total) by mouth daily as needed. If you gain 3 lbs in 24 hours or 5 lbs in 1 week 30 tablet 1   glucose blood (ACCU-CHEK AVIVA PLUS) test strip Use as instructed 50 each 1   hyoscyamine (LEVSIN SL) 0.125 MG SL tablet Place 1 tablet (0.125 mg total) under the tongue every 6 (six) hours as needed. For abdominal cramping 30 tablet 0   insulin glargine (LANTUS) 100 UNIT/ML Solostar Pen Inject 24 Units into the skin every morning. 3 mL 11   Insulin Pen Needle 32G X 4 MM MISC 1 Needle by Does not apply route in the morning and at bedtime. 120 each 2   ipratropium (ATROVENT HFA) 17 MCG/ACT inhaler Inhale 2 puffs into the lungs every 4 (four) hours as needed for wheezing (COPD).     lactulose (CHRONULAC) 10 GM/15ML solution Take 30 mLs (20 g total) by mouth 2 (two) times daily as needed for mild constipation, moderate constipation or severe constipation. 236 mL 0   lidocaine-prilocaine (EMLA) cream APPLY 1 APPLICATION TOPICALLY AS NEEDED. 30 g 2   LORazepam (ATIVAN) 0.5 MG tablet Take 1 tablet (0.5 mg total) by mouth every 8 (eight) hours as needed for anxiety (nausea). 30 tablet 0   metoCLOPramide (REGLAN) 10 MG tablet Take 1 tablet (10 mg total) by mouth 3 (three) times daily before meals. 60 tablet 0   nicotine polacrilex (NICORETTE) 2 MG gum Take 1 each (2 mg total) by mouth as needed for smoking cessation. 100 tablet 0   nitroGLYCERIN (NITROSTAT) 0.4 MG SL tablet Place 1 tablet (0.4 mg total) under the tongue every 5 (five) minutes as needed for chest pain. 25 tablet 5   nystatin (MYCOSTATIN) 100000 UNIT/ML suspension Take 5 mLs (500,000 Units total) by mouth 4 (four) times daily. 60 mL 0   ondansetron (ZOFRAN-ODT) 8 MG disintegrating tablet Take 1 tablet (8 mg total) by mouth every 8 (eight) hours as needed for nausea or vomiting. (Patient not taking: Reported on 02/02/2022) 30 tablet 2   oxyCODONE (OXY IR/ROXICODONE) 5 MG immediate release tablet Take  1-2 tablets (5-10 mg total) by mouth every 4 (four) hours as needed for severe pain. 90 tablet 0   oxyCODONE ER (XTAMPZA ER) 13.5 MG C12A Take 13.5 mg by mouth every 12 (twelve) hours. 60 capsule 0   pantoprazole (PROTONIX) 40 MG tablet TAKE 1 TABLET (40 MG TOTAL) BY MOUTH DAILY. 30 tablet 1   polyethylene glycol powder (GAVILAX) 17 GM/SCOOP powder TAKE ONE CAPFUL (17 GRAMS )BY MOUTH DAILY AS NEEDED FOR MILD OR MODERATE CONSTIPATION. 510 g 1   potassium chloride SA (KLOR-CON M) 20 MEQ tablet Take 1 tablet (20 mEq total) by mouth daily as needed. On days you take Lasix 30 tablet 1   prochlorperazine (COMPAZINE)  10 MG tablet TAKE 1 TABLET (10 MG TOTAL) BY MOUTH EVERY 6 (SIX) HOURS AS NEEDED FOR NAUSEA OR VOMITING. 30 tablet 0   valACYclovir (VALTREX) 1000 MG tablet TAKE FOR 3 DAYS AS NEEDED FOR EACH OUTBREAK 30 tablet 11   vitamin B-12 (CYANOCOBALAMIN) 1000 MCG tablet Take 1,000 mcg by mouth daily.     No current facility-administered medications for this visit.    SURGICAL HISTORY:  Past Surgical History:  Procedure Laterality Date   BLADDER SURGERY  1980   "TVT"   BLADDER SURGERY  2003   "TVT"   BREAST EXCISIONAL BIOPSY     BREAST SURGERY Right    I&D for multiple abscesses   CARDIAC CATHETERIZATION     CORONARY ARTERY BYPASS GRAFT N/A 06/30/2021   Procedure: CORONARY ARTERY BYPASS GRAFTING (CABG) x3 USING LEFT INTERNAL MAMMARY ARTERY,  RIGHT RADIAL ARTERY AND LEFT GREATER SAPHENOUS VEIN;  Surgeon: Corliss Skains, MD;  Location: [facility] OR;  Service: Open Heart Surgery;  Laterality: N/A;   cutting balloon     ENDOVEIN HARVEST OF GREATER SAPHENOUS VEIN Left 06/30/2021   Procedure: ENDOVEIN HARVEST OF GREATER SAPHENOUS VEIN;  Surgeon: Corliss Skains, MD;  Location: [facility] OR;  Service: Open Heart Surgery;  Laterality: Left;   EYE SURGERY Left    laser surgery   FINE NEEDLE ASPIRATION  08/21/2021   Procedure: FINE NEEDLE ASPIRATION (FNA) LINEAR;  Surgeon: Josephine Igo, DO;  Location:  [facility];  Service: Pulmonary;;   INCISE AND DRAIN ABCESS  ; 04/26/11; 08/13/11   right breast   INCISION AND DRAINAGE ABSCESS Right 05/09/2012   Procedure: INCISION AND DRAINAGE RIGHT BREAST ABSCESS;  Surgeon: Wilmon Arms. Corliss Skains, MD;  Location: [facility] OR;  Service: General;  Laterality: Right;   INCISION AND DRAINAGE ABSCESS Right 02/08/2014   Procedure: INCISION AND DRAINAGE RIGHT BREAST ABSCESS;  Surgeon: Manus Rudd, MD;  Location: [facility] OR;  Service: General;  Laterality: Right;   INCISION AND DRAINAGE ABSCESS Right 06/14/2014   Procedure: INCISION AND DRAINAGE RIGHT BREAST ABSCESS;  Surgeon: Manus Rudd, MD;  Location: [facility];  Service: General;  Laterality: Right;   IR IMAGING GUIDED PORT INSERTION  12/08/2021   IRRIGATION AND DEBRIDEMENT ABSCESS  12/19/2010   Procedure: IRRIGATION AND DEBRIDEMENT ABSCESS;  Surgeon: Emelia Loron, MD;  Location: [facility] OR;  Service: General;  Laterality: Right;   IRRIGATION AND DEBRIDEMENT ABSCESS  08/13/2011   Procedure: MINOR INCISION AND DRAINAGE OF ABSCESS;  Surgeon: Cherylynn Ridges, MD;  Location: [facility];  Service: General;  Laterality: Right;  Right Breast    IRRIGATION AND DEBRIDEMENT ABSCESS  11/17/2011   Procedure: IRRIGATION AND DEBRIDEMENT ABSCESS;  Surgeon: Wilmon Arms. Corliss Skains, MD;  Location: [facility];  Service: General;  Laterality: Right;  irrigation and debridement right recurrent breast abscess   LARYNX SURGERY     LEFT HEART CATH AND CORONARY ANGIOGRAPHY N/A 04/09/2021   Procedure: LEFT HEART CATH AND CORONARY ANGIOGRAPHY;  Surgeon: Corky Crafts, MD;  Location: [facility] INVASIVE CV LAB;  Service: Cardiovascular;  Laterality: N/A;   LEFT HEART CATHETERIZATION WITH CORONARY ANGIOGRAM N/A 05/29/2012   Procedure: LEFT HEART CATHETERIZATION WITH CORONARY ANGIOGRAM & PTCA;  Surgeon: Lennette Bihari, MD;  Location: [facility] CATH LAB;  Service: Cardiovascular; Bifurcation dRCA-RPAD/PLA & rPDA  PTCA.   LEFT HEART CATHETERIZATION WITH CORONARY ANGIOGRAM N/A  11/08/2013   Procedure: LEFT HEART CATHETERIZATION WITH CORONARY ANGIOGRAM and Coronary Stent Intervention;  Surgeon: Marykay Lex, MD;  Location: [facility] CATH LAB;  Service: Cardiovascular; mLAD 80% (FFR 0.73) - resolute DES 3.0 x 22 mm (postdilated to 3.3 mm->3.5 mm)   NM MYOVIEW LTD  01/26/2009   Normal study, no evidence of ischemia, EF 67%   ployp removed from voice box 03/30/12     RADIAL ARTERY HARVEST Right 06/30/2021   Procedure: RADIAL ARTERY HARVEST;  Surgeon: Corliss Skains, MD;  Location: [facility];  Service: Open Heart Surgery;  Laterality: Right;   REFRACTIVE SURGERY  ~ 2010   right   TEE WITHOUT CARDIOVERSION N/A 06/30/2021   Procedure: TRANSESOPHAGEAL ECHOCARDIOGRAM (TEE);  Surgeon: Corliss Skains, MD;  Location: [facility] OR;  Service: Open Heart Surgery;  Laterality: N/A;   TONSILLECTOMY  1988   VIDEO BRONCHOSCOPY WITH ENDOBRONCHIAL ULTRASOUND Bilateral 08/21/2021   Procedure: VIDEO BRONCHOSCOPY WITH ENDOBRONCHIAL ULTRASOUND;  Surgeon: Josephine Igo, DO;  Location: [facility];  Service: Pulmonary;  Laterality: Bilateral;    REVIEW OF SYSTEMS:  A comprehensive review of systems was negative except for: Constitutional: positive for fatigue   PHYSICAL EXAMINATION: General appearance: alert, cooperative, fatigued, and no distress Head: Normocephalic, without obvious abnormality, atraumatic Neck: no adenopathy, no JVD, supple, symmetrical, trachea midline, and thyroid not enlarged, symmetric, no tenderness/mass/nodules Lymph nodes: Cervical, supraclavicular, and axillary nodes normal. Resp: clear to auscultation bilaterally Back: symmetric, no curvature. ROM normal. No CVA tenderness. Cardio: regular rate and rhythm, S1, S2 normal, no murmur, click, rub or gallop GI: soft, non-tender; bowel sounds normal; no masses,  no organomegaly Extremities: edema trace edema in the ankles  ECOG PERFORMANCE STATUS: 1 - Symptomatic but completely ambulatory  Blood pressure 104/71, pulse  63, temperature 97.7 F (36.5 C), temperature source Oral, resp. rate 17, weight 192 lb 3.2 oz (87.2 kg), last menstrual period 06/12/2016, SpO2 100 %.  LABORATORY DATA: Lab Results  Component Value Date   WBC 5.5 06/01/2022   HGB 9.4 (L) 06/01/2022   HCT 27.0 (L) 06/01/2022   MCV 103.4 (H) 06/01/2022   PLT 267 06/01/2022      Chemistry      Component Value Date/Time   NA 138 06/01/2022 1046   NA 140 04/03/2021 1059   K 4.0 06/01/2022 1046   CL 108 06/01/2022 1046   CO2 27 06/01/2022 1046   BUN 10 06/01/2022 1046   BUN 13 04/03/2021 1059   CREATININE 0.98 06/01/2022 1046   CREATININE 0.65 11/06/2013 1520      Component Value Date/Time   CALCIUM 8.5 (L) 06/01/2022 1046   ALKPHOS 64 06/01/2022 1046   AST 22 06/01/2022 1046   ALT 19 06/01/2022 1046   BILITOT 0.4 06/01/2022 1046       RADIOGRAPHIC STUDIES: CT Chest W Contrast  Result Date: 05/31/2022 CLINICAL DATA:  Staging non-small-cell lung cancer. Last chemotherapy 05/12/2022. * Tracking Code: BO * EXAM: CT CHEST, ABDOMEN, AND PELVIS WITH CONTRAST TECHNIQUE: Multidetector CT imaging of the chest, abdomen and pelvis was performed following the standard protocol during bolus administration of intravenous contrast. RADIATION DOSE REDUCTION: This exam was performed according to the departmental dose-optimization program which includes automated exposure control, adjustment of the mA and/or kV according to patient size and/or use of iterative reconstruction technique. CONTRAST:  OMNIPAQUE IOHEXOL 300 MG/ML  SOLN COMPARISON:  CT 03/29/2022 FINDINGS: CT CHEST FINDINGS Cardiovascular: Status post median sternotomy. Heart is nonenlarged. Small pericardial effusion. Scattered vascular calcifications are seen including the thoracic aorta and coronary arteries. Normal caliber thoracic aorta. Left-sided chest port again noted but incompletely included in the imaging field.  Mediastinum/Nodes: No specific abnormal lymph node enlargement  identified in the axillary region, hilum or mediastinum. Normal caliber thoracic esophagus. Small cyst along the right side of the thyroid, unchanged from previous. No specific imaging follow-up based on appearance. Lungs/Pleura: Lungs are without consolidation, pneumothorax or effusion. There is some dependent atelectasis noted bilaterally. Few subpleural blebs and emphysematous changes identified in the upper lung zones. The previous spiculated nodule in the right upper lobe is again noted. Previously this measured 18 x 8 mm and today when measured in the same fashion for continuity measures 18 by 9 mm on series 4, image 43. No new dominant lung nodule. Musculoskeletal: Mild degenerative changes along the spine. Old healed left-sided rib fracture. CT ABDOMEN PELVIS FINDINGS Hepatobiliary: Once again benign lesions are seen in the liver involving segment 1 and 7 near the IVC. Previous lesions on the prior PET-CT scan of 08/28/2021 are not seen on the current examination. Only minimal area of capsular retraction the left lobe lateral segment. No new space-occupying liver lesion. Patent portal vein. Gallbladder is present. Pancreas: Unremarkable. No pancreatic ductal dilatation or surrounding inflammatory changes. Spleen: Normal in size without focal abnormality. Adrenals/Urinary Tract: Preserved left adrenal gland. Right-sided adrenal nodule once again identified also demonstrating long-term stability measuring 2.6 by 1.9 cm on series 2, image 59. Again favor a benign lesion. No enhancing renal mass or collecting system dilatation. Preserved contours of the urinary bladder. The bladder wall slightly thickened, unchanged from previous. Stomach/Bowel: Large bowel has a normal course and caliber with diffuse colonic stool. Overall moderate stool burden. Normal appendix. Stomach and small bowel are nondilated. There appear to be areas of mild wall thickening along loops of small bowel. Please correlate with any history.  Vascular/Lymphatic: Normal caliber aorta and IVC with scattered vascular calcifications. No discrete abnormal lymph node enlargement identified in the abdomen and pelvis. Reproductive: Uterus and bilateral adnexa are unremarkable. Other: Anasarca is increased from previous. Mesenteric haziness and mild ascites identified, similar to previous. Musculoskeletal: Scattered sclerotic bone lesions identified along the spine and pelvis. Distribution similar based on CT. If needed a follow up bone scan as clinically appropriate. IMPRESSION: Stable right upper lobe spiculated lung nodule. No developing new lung mass or lymph node enlargement in the thorax. Stable benign-appearing lesions in the liver, right adrenal gland. No new or recurrent liver mass. No developing lymph node enlargement. Increasing anasarca. Stable mesenteric stranding and trace ascites. There appears to be some mild fold thickening along loops of small bowel without obstruction or dilatation. Please correlate with any particular symptoms. Scattered areas of bony sclerosis, similar distribution by CT. If needed additional workup as clinically appropriate Electronically Signed   By: Karen Kays M.D.   On: 05/31/2022 11:06   CT Abdomen Pelvis W Contrast  Result Date: 05/31/2022 CLINICAL DATA:  Staging non-small-cell lung cancer. Last chemotherapy 05/12/2022. * Tracking Code: BO * EXAM: CT CHEST, ABDOMEN, AND PELVIS WITH CONTRAST TECHNIQUE: Multidetector CT imaging of the chest, abdomen and pelvis was performed following the standard protocol during bolus administration of intravenous contrast. RADIATION DOSE REDUCTION: This exam was performed according to the departmental dose-optimization program which includes automated exposure control, adjustment of the mA and/or kV according to patient size and/or use of iterative reconstruction technique. CONTRAST:  OMNIPAQUE IOHEXOL 300 MG/ML  SOLN COMPARISON:  CT 03/29/2022 FINDINGS: CT CHEST FINDINGS  Cardiovascular: Status post median sternotomy. Heart is nonenlarged. Small pericardial effusion. Scattered vascular calcifications are seen including the thoracic aorta and coronary arteries. Normal  caliber thoracic aorta. Left-sided chest port again noted but incompletely included in the imaging field. Mediastinum/Nodes: No specific abnormal lymph node enlargement identified in the axillary region, hilum or mediastinum. Normal caliber thoracic esophagus. Small cyst along the right side of the thyroid, unchanged from previous. No specific imaging follow-up based on appearance. Lungs/Pleura: Lungs are without consolidation, pneumothorax or effusion. There is some dependent atelectasis noted bilaterally. Few subpleural blebs and emphysematous changes identified in the upper lung zones. The previous spiculated nodule in the right upper lobe is again noted. Previously this measured 18 x 8 mm and today when measured in the same fashion for continuity measures 18 by 9 mm on series 4, image 43. No new dominant lung nodule. Musculoskeletal: Mild degenerative changes along the spine. Old healed left-sided rib fracture. CT ABDOMEN PELVIS FINDINGS Hepatobiliary: Once again benign lesions are seen in the liver involving segment 1 and 7 near the IVC. Previous lesions on the prior PET-CT scan of 08/28/2021 are not seen on the current examination. Only minimal area of capsular retraction the left lobe lateral segment. No new space-occupying liver lesion. Patent portal vein. Gallbladder is present. Pancreas: Unremarkable. No pancreatic ductal dilatation or surrounding inflammatory changes. Spleen: Normal in size without focal abnormality. Adrenals/Urinary Tract: Preserved left adrenal gland. Right-sided adrenal nodule once again identified also demonstrating long-term stability measuring 2.6 by 1.9 cm on series 2, image 59. Again favor a benign lesion. No enhancing renal mass or collecting system dilatation. Preserved contours of  the urinary bladder. The bladder wall slightly thickened, unchanged from previous. Stomach/Bowel: Large bowel has a normal course and caliber with diffuse colonic stool. Overall moderate stool burden. Normal appendix. Stomach and small bowel are nondilated. There appear to be areas of mild wall thickening along loops of small bowel. Please correlate with any history. Vascular/Lymphatic: Normal caliber aorta and IVC with scattered vascular calcifications. No discrete abnormal lymph node enlargement identified in the abdomen and pelvis. Reproductive: Uterus and bilateral adnexa are unremarkable. Other: Anasarca is increased from previous. Mesenteric haziness and mild ascites identified, similar to previous. Musculoskeletal: Scattered sclerotic bone lesions identified along the spine and pelvis. Distribution similar based on CT. If needed a follow up bone scan as clinically appropriate. IMPRESSION: Stable right upper lobe spiculated lung nodule. No developing new lung mass or lymph node enlargement in the thorax. Stable benign-appearing lesions in the liver, right adrenal gland. No new or recurrent liver mass. No developing lymph node enlargement. Increasing anasarca. Stable mesenteric stranding and trace ascites. There appears to be some mild fold thickening along loops of small bowel without obstruction or dilatation. Please correlate with any particular symptoms. Scattered areas of bony sclerosis, similar distribution by CT. If needed additional workup as clinically appropriate Electronically Signed   By: Karen Kays M.D.   On: 05/31/2022 11:06    ASSESSMENT AND PLAN: This is a very pleasant 57 years old African-American female recently diagnosed with a stage IV (T2 a, N2, M1 C) non-small cell lung cancer, adenocarcinoma presented with right upper lobe lung mass in addition to right hilar and mediastinal lymphadenopathy as well as innumerable bilateral pulmonary nodules and bone and liver metastasis diagnosed in  August 2023 with positive KRAS G12C mutation and PD-L1 expression of 89%. She is currently undergoing systemic chemotherapy with carboplatin for AUC of 5, Alimta 500 Mg/M2 and Keytruda 200 Mg IV every 3 weeks status post 15 cycles.  The patient has been tolerating this treatment well with no concerning adverse effect except for mild  fatigue and swelling of the lower extremities. I recommended for her to proceed with cycle #16 today as planned. I will see her back for follow-up visit in 3 weeks for evaluation with repeat CT scan of the chest, abdomen and pelvis for restaging of her disease. For the swelling of the lower extremities, she will continue her current treatment with Lasix for now. For the lacrimation of the eye, she was advised to use over-the-counter saline eyedrops. For the dyspepsia and epigastric pain, she will continue her current treatment with Protonix. For the pain management she is followed by the palliative care team and currently on Xtampza ER 13.5 mg every 12 hours in addition to Oxy IR for breakthrough pain. For the anemia, this is likely secondary to chemotherapy with Alimta.  I may consider discontinuing Alimta after the next cycle of her treatment if the scan showed no concerning finding for progression. She was advised to call immediately if she has any concerning symptoms in the interval. The patient voices understanding of current disease status and treatment options and is in agreement with the current care plan.  All questions were answered. The patient knows to call the clinic with any problems, questions or concerns. We can certainly see the patient much sooner if necessary. The total time spent in the appointment was 20 minutes.  Disclaimer: This note was dictated with voice recognition software. Similar sounding words can inadvertently be transcribed and may not be corrected upon review.

## 2022-07-14 NOTE — Patient Instructions (Signed)
[facility]  Discharge Instructions: Thank you for choosing [facility] to provide your oncology and hematology care.   If you have a lab appointment with the Cancer Center, please go directly to the Cancer Center and check in at the registration area.   Wear comfortable clothing and clothing appropriate for easy access to any Portacath or PICC line.   We strive to give you quality time with your provider. You may need to reschedule your appointment if you arrive late (15 or more minutes).  Arriving late affects you and other patients whose appointments are after yours.  Also, if you miss three or more appointments without notifying the office, you may be dismissed from the clinic at the provider's discretion.      For prescription refill requests, have your pharmacy contact our office and allow 72 hours for refills to be completed.    Today you received the following chemotherapy and/or immunotherapy agents :  Pembrolizumab & Pemetrexed.       To help prevent nausea and vomiting after your treatment, we encourage you to take your nausea medication as directed.  BELOW ARE SYMPTOMS THAT SHOULD BE REPORTED IMMEDIATELY: *FEVER GREATER THAN 100.4 F (38 C) OR HIGHER *CHILLS OR SWEATING *NAUSEA AND VOMITING THAT IS NOT CONTROLLED WITH YOUR NAUSEA MEDICATION *UNUSUAL SHORTNESS OF BREATH *UNUSUAL BRUISING OR BLEEDING *URINARY PROBLEMS (pain or burning when urinating, or frequent urination) *BOWEL PROBLEMS (unusual diarrhea, constipation, pain near the anus) TENDERNESS IN MOUTH AND THROAT WITH OR WITHOUT PRESENCE OF ULCERS (sore throat, sores in mouth, or a toothache) UNUSUAL RASH, SWELLING OR PAIN  UNUSUAL VAGINAL DISCHARGE OR ITCHING   Items with * indicate a potential emergency and should be followed up as soon as possible or go to the Emergency Department if any problems should occur.  Please show the CHEMOTHERAPY ALERT CARD or  IMMUNOTHERAPY ALERT CARD at check-in to the Emergency Department and triage nurse.  Should you have questions after your visit or need to cancel or reschedule your appointment, please contact [facility]  Dept: [phone]  and follow the prompts.  Office hours are 8:00 a.m. to 4:30 p.m. Monday - Friday. Please note that voicemails left after 4:00 p.m. may not be returned until the following business day.  We are closed weekends and major holidays. You have access to a nurse at all times for urgent questions. Please call the main number to the clinic Dept: [phone] and follow the prompts.   For any non-urgent questions, you may also contact your provider using MyChart. We now offer e-Visits for anyone 18 and older to request care online for non-urgent symptoms. For details visit [email or web address].   Also download the MyChart app! Go to the app store, search "MyChart", open the app, select [facility], and log in with your MyChart username and password.   

## 2022-07-15 ENCOUNTER — Encounter: Payer: Self-pay | Admitting: Internal Medicine

## 2022-07-21 ENCOUNTER — Other Ambulatory Visit: Payer: Self-pay | Admitting: Family Medicine

## 2022-07-21 MED ORDER — FLUCONAZOLE 150 MG PO TABS: 150.0000 mg | ORAL_TABLET | Freq: Once | ORAL | 0 refills | Status: AC

## 2022-08-02 ENCOUNTER — Ambulatory Visit (HOSPITAL_BASED_OUTPATIENT_CLINIC_OR_DEPARTMENT_OTHER)
Admission: RE | Admit: 2022-08-02 | Discharge: 2022-08-02 | Disposition: A | Discharge status: Home / Self Care | Payer: Medicaid Other | Source: Ambulatory Visit | Attending: Internal Medicine | Admitting: Internal Medicine

## 2022-08-02 ENCOUNTER — Other Ambulatory Visit: Payer: Self-pay | Admitting: Internal Medicine

## 2022-08-02 ENCOUNTER — Encounter (HOSPITAL_BASED_OUTPATIENT_CLINIC_OR_DEPARTMENT_OTHER): Payer: Self-pay

## 2022-08-02 DIAGNOSIS — C3490 Malignant neoplasm of unspecified part of unspecified bronchus or lung: Secondary | ICD-10-CM

## 2022-08-02 DIAGNOSIS — D3501 Benign neoplasm of right adrenal gland: Secondary | ICD-10-CM | POA: Diagnosis not present

## 2022-08-02 DIAGNOSIS — J9811 Atelectasis: Secondary | ICD-10-CM | POA: Diagnosis not present

## 2022-08-02 DIAGNOSIS — C3491 Malignant neoplasm of unspecified part of right bronchus or lung: Secondary | ICD-10-CM

## 2022-08-02 MED ORDER — IOHEXOL 300 MG/ML  SOLN
100.0000 mL | Freq: Once | INTRAMUSCULAR | Status: AC | PRN
  Administered 2022-08-02: 100 mL via INTRAVENOUS

## 2022-08-03 NOTE — Progress Notes (Unsigned)
Palliative Medicine [facility]  [phone] [phone]   Name: Cassidy [name] Date: 08/03/2022 MRN: [id number]  DOB: [birth year]  Patient Care Team: [name], MD as PCP - General (Family Medicine) O'Neal, Ronnald Ramp, MD as PCP - Cardiology (Cardiology) Emelia Loron, MD as Consulting Physician (General Surgery)    INTERVAL HISTORY: Cassidy [name] is a 57 y.o. 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  Palliative ask to see for symptom management and goals of care.   SOCIAL HISTORY:    Cassidy [name] reports that she has quit smoking. Her smoking use included cigarettes. She started smoking about 43 years ago. She has a 21.8 pack-year smoking history. She has never used smokeless tobacco. She reports that she does not currently use drugs. She reports that she does not drink alcohol.  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  s/p PCTA to dRCA and ostial RPAV-PLA vessel + PDA branch (05/2012)  b. [place] s/p DES to mLAD - Resolute DES 3.0 x 22 (3.53mm -->3.3 mm)   Bipolar depression (HCC)    Breast abscess    a. right side.    COPD (chronic obstructive pulmonary disease) (HCC)    Depression    Diabetic peripheral neuropathy (HCC)    Fibromyalgia    Genital warts    GERD (gastroesophageal reflux disease)    History of hiatal hernia    HLD (hyperlipidemia)    Hypertension    Lung cancer (HCC)    Pain in limb    a. LE VENOUS DUPLEX, 02/05/2009 - no evidence of deep vein thrombosis, Baker's cyst    Pneumonia    PTSD (post-traumatic stress disorder)    Sickle cell trait (HCC)    Tobacco abuse    Tooth caries    Type II diabetes mellitus (HCC)     ALLERGIES:  is allergic to amoxil [amoxicillin], chantix [varenicline], suboxone [buprenorphine hcl-naloxone hcl], and emend [fosaprepitant dimeglumine].  MEDICATIONS:  Current Outpatient Medications  Medication Sig Dispense Refill   aspirin EC 81 MG tablet Take 81 mg by mouth daily.     atorvastatin (LIPITOR) 40 MG tablet TAKE 1 TABLET (40 MG TOTAL) BY MOUTH DAILY. 90 tablet 3   busPIRone (BUSPAR) 15 MG tablet Take 0.5 tablets (7.5 mg total) by mouth 2 (two) times daily. 60 tablet 3   Carboxymethylcellul-Glycerin (LUBRICATING EYE DROPS OP) Place 1 drop into both eyes daily as needed (dry eyes).     carvedilol (COREG) 12.5 MG tablet TAKE 1 TABLET (12.5 MG TOTAL) BY MOUTH 2 (TWO) TIMES DAILY WITH A MEAL. 180 tablet 1   diclofenac Sodium (VOLTAREN) 1 % GEL APPLY 1 APPLICATION TOPICALLY 3 TIMES DAILY AS NEEDED FOR PAIN 200 g 1   DULoxetine (CYMBALTA) 30 MG capsule Take 1 capsule (30 mg total) by mouth daily. 30 capsule 5   ferrous sulfate 325 (65 FE) MG tablet Take 325 mg by mouth daily with breakfast.     fluticasone (FLONASE) 50 MCG/ACT nasal spray PLACE 1 SPRAY INTO BOTH NOSTRILS DAILY AS NEEDED (CONGESTION). 16 g 1  fluticasone-salmeterol (ADVAIR) 100-50 MCG/ACT AEPB Inhale 1 puff into the lungs 2 (two) times daily. 1 each 3   folic acid (FOLVITE) 1 MG tablet Take 1 tablet (1 mg total) by mouth daily. 30 tablet 3   furosemide (LASIX) 40 MG tablet Take 1 tablet (40 mg total) by mouth daily as needed. If you gain 3 lbs in 24 hours or 5 lbs in 1 week 30 tablet 1   glucose blood (ACCU-CHEK AVIVA PLUS) test strip Use as instructed 50 each 1   hyoscyamine (LEVSIN SL) 0.125 MG SL tablet Place 1 tablet (0.125 mg total) under the tongue every 6 (six) hours as needed. For abdominal cramping 30 tablet 0   insulin glargine (LANTUS) 100 UNIT/ML  Solostar Pen Inject 24 Units into the skin every morning. 3 mL 11   Insulin Pen Needle 32G X 4 MM MISC 1 Needle by Does not apply route in the morning and at bedtime. 120 each 2   ipratropium (ATROVENT HFA) 17 MCG/ACT inhaler Inhale 2 puffs into the lungs every 4 (four) hours as needed for wheezing (COPD).     lactulose (CHRONULAC) 10 GM/15ML solution Take 30 mLs (20 g total) by mouth 2 (two) times daily as needed for mild constipation, moderate constipation or severe constipation. 236 mL 0   lidocaine-prilocaine (EMLA) cream APPLY 1 APPLICATION TOPICALLY AS NEEDED. 30 g 2   LORazepam (ATIVAN) 0.5 MG tablet Take 1 tablet (0.5 mg total) by mouth every 8 (eight) hours as needed for anxiety (nausea). 30 tablet 0   metoCLOPramide (REGLAN) 10 MG tablet Take 1 tablet (10 mg total) by mouth 3 (three) times daily before meals. 60 tablet 0   nicotine polacrilex (NICORETTE) 2 MG gum Take 1 each (2 mg total) by mouth as needed for smoking cessation. 100 tablet 0   nitroGLYCERIN (NITROSTAT) 0.4 MG SL tablet Place 1 tablet (0.4 mg total) under the tongue every 5 (five) minutes as needed for chest pain. 25 tablet 5   nystatin (MYCOSTATIN) 100000 UNIT/ML suspension Take 5 mLs (500,000 Units total) by mouth 4 (four) times daily. 60 mL 0   ondansetron (ZOFRAN-ODT) 8 MG disintegrating tablet Take 1 tablet (8 mg total) by mouth every 8 (eight) hours as needed for nausea or vomiting. 30 tablet 2   oxyCODONE (OXY IR/ROXICODONE) 5 MG immediate release tablet Take 1-2 tablets (5-10 mg total) by mouth every 4 (four) hours as needed for severe pain. 90 tablet 0   oxyCODONE ER (XTAMPZA ER) 13.5 MG C12A Take 13.5 mg by mouth every 12 (twelve) hours. 60 capsule 0   pantoprazole (PROTONIX) 40 MG tablet Take 1 tablet (40 mg total) by mouth 2 (two) times daily. 60 tablet 5   polyethylene glycol powder (GAVILAX) 17 GM/SCOOP powder TAKE ONE CAPFUL (17 GRAMS )BY MOUTH DAILY AS NEEDED FOR MILD OR MODERATE CONSTIPATION. 510 g 1    potassium chloride SA (KLOR-CON M) 20 MEQ tablet Take 1 tablet (20 mEq total) by mouth daily as needed. On days you take Lasix 30 tablet 1   prochlorperazine (COMPAZINE) 10 MG tablet TAKE 1 TABLET (10 MG TOTAL) BY MOUTH EVERY 6 (SIX) HOURS AS NEEDED FOR NAUSEA OR VOMITING. 30 tablet 0   valACYclovir (VALTREX) 1000 MG tablet Take for 3 days prn each outbreak. 30 tablet 11   vitamin B-12 (CYANOCOBALAMIN) 1000 MCG tablet Take 1,000 mcg by mouth daily.     No current facility-administered medications for this visit.    VITAL SIGNS: LMP 06/12/2016 (Approximate)  There were no vitals filed for this visit.  Estimated body mass index is 29.07 kg/m as calculated from the following:   Height as of 07/14/22: 5' 7.5" (1.715 m).   Weight as of 07/14/22: 188 lb 6.4 oz (85.5 kg).   PERFORMANCE STATUS (ECOG) : 1 - Symptomatic but completely ambulatory   Physical Exam: General: NAD Pulmonary: normal breathing pattern  Extremities: pedal edema, no joint deformities Skin: no rashes Neurological: alert and oriented x 4, mood and affect appropriate   IMPRESSION: .    Neoplasm related pain Cassidy [name] reports that her pain is well controlled on current regimen. She is taking Xtampza 13 mg every 12 hours and Oxycodone 5-10 mg every 4 hours as needed for breakthrough pain. Does not require around the clock.   She has started Cymbalta. Tolerating without unwanted side effects.   Is also using topical Voltaren to hip area for aches during the evening and night time hours.   2.  Nausea Controlled on regimen.   3. Constipation  Better controlled with regimen. Reports GI made some adjustments to regimen.   We discussed the importance of continued conversation with family and their medical providers regarding overall plan of care and treatment options, ensuring decisions are within the context of the patients values and GOCs.  PLAN: Xtampza 13 mg every 12 hours. Oxycodone IR 5-10 mg every 6 hours as  needed for breakthrough pain. Senna-S and Miralax on a daily basis for the management of constipation. Zofran, Compazine, and Ativan as needed for nausea Ongoing emotional support and symptom management. Palliative will plan to see patient back in 3-4 weeks in collaboration with her oncology appointments.   Patient expressed understanding and was in agreement with this plan. She also understands that She can call the clinic at any time with any questions, concerns, or complaints.    Any controlled substances utilized were prescribed in the context of palliative care. PDMP has been reviewed.   Any controlled substances utilized were prescribed in the context of palliative care. PDMP has been reviewed.    Visit consisted of counseling and education dealing with the complex and emotionally intense issues of symptom management and palliative care in the setting of serious and potentially life-threatening illness.Greater than 50%  of this time was spent counseling and coordinating care related to the above assessment and plan.  Willette Alma, AGPCNP-BC  Palliative Medicine Team/[facility]  *Please note that this is a verbal dictation therefore any spelling or grammatical errors are due to the "[employer] One" system interpretation.

## 2022-08-04 ENCOUNTER — Encounter: Payer: Self-pay | Admitting: Nurse Practitioner

## 2022-08-04 ENCOUNTER — Inpatient Hospital Stay: Payer: Medicaid Other

## 2022-08-04 ENCOUNTER — Other Ambulatory Visit: Payer: Medicaid Other

## 2022-08-04 ENCOUNTER — Other Ambulatory Visit: Payer: Self-pay | Admitting: Physician Assistant

## 2022-08-04 ENCOUNTER — Inpatient Hospital Stay (HOSPITAL_BASED_OUTPATIENT_CLINIC_OR_DEPARTMENT_OTHER): Payer: Medicaid Other | Admitting: Nurse Practitioner

## 2022-08-04 ENCOUNTER — Encounter: Payer: Self-pay | Admitting: Medical Oncology

## 2022-08-04 ENCOUNTER — Inpatient Hospital Stay (HOSPITAL_BASED_OUTPATIENT_CLINIC_OR_DEPARTMENT_OTHER): Payer: Medicaid Other | Admitting: Internal Medicine

## 2022-08-04 ENCOUNTER — Other Ambulatory Visit: Payer: Self-pay

## 2022-08-04 VITALS — BP 96/62 | HR 71 | Temp 97.8°F | Resp 17 | Ht 67.0 in | Wt 184.6 lb

## 2022-08-04 DIAGNOSIS — C3491 Malignant neoplasm of unspecified part of right bronchus or lung: Secondary | ICD-10-CM | POA: Diagnosis not present

## 2022-08-04 DIAGNOSIS — R530 Neoplastic (malignant) related fatigue: Secondary | ICD-10-CM | POA: Diagnosis not present

## 2022-08-04 DIAGNOSIS — K5903 Drug induced constipation: Secondary | ICD-10-CM

## 2022-08-04 DIAGNOSIS — Z5112 Encounter for antineoplastic immunotherapy: Secondary | ICD-10-CM | POA: Diagnosis not present

## 2022-08-04 DIAGNOSIS — Z515 Encounter for palliative care: Secondary | ICD-10-CM | POA: Diagnosis not present

## 2022-08-04 DIAGNOSIS — G893 Neoplasm related pain (acute) (chronic): Secondary | ICD-10-CM

## 2022-08-04 DIAGNOSIS — R300 Dysuria: Secondary | ICD-10-CM

## 2022-08-04 DIAGNOSIS — Z95828 Presence of other vascular implants and grafts: Secondary | ICD-10-CM

## 2022-08-04 LAB — CBC WITH DIFFERENTIAL (CANCER CENTER ONLY)
Abs Immature Granulocytes: 0.02 10*3/uL (ref 0.00–0.07)
Basophils Absolute: 0 10*3/uL (ref 0.0–0.1)
Basophils Relative: 0 %
Eosinophils Absolute: 0 10*3/uL (ref 0.0–0.5)
Eosinophils Relative: 1 %
HCT: 26.5 % — ABNORMAL LOW (ref 36.0–46.0)
Hemoglobin: 9 g/dL — ABNORMAL LOW (ref 12.0–15.0)
Immature Granulocytes: 1 %
Lymphocytes Relative: 21 %
Lymphs Abs: 0.8 10*3/uL (ref 0.7–4.0)
MCH: 36.1 pg — ABNORMAL HIGH (ref 26.0–34.0)
MCHC: 34 g/dL (ref 30.0–36.0)
MCV: 106.4 fL — ABNORMAL HIGH (ref 80.0–100.0)
Monocytes Absolute: 0.5 10*3/uL (ref 0.1–1.0)
Monocytes Relative: 14 %
Neutro Abs: 2.3 10*3/uL (ref 1.7–7.7)
Neutrophils Relative %: 63 %
Platelet Count: 281 10*3/uL (ref 150–400)
RBC: 2.49 MIL/uL — ABNORMAL LOW (ref 3.87–5.11)
RDW: 15.4 % (ref 11.5–15.5)
WBC Count: 3.6 10*3/uL — ABNORMAL LOW (ref 4.0–10.5)
nRBC: 0 % (ref 0.0–0.2)

## 2022-08-04 LAB — CMP (CANCER CENTER ONLY)
ALT: 14 U/L (ref 0–44)
AST: 20 U/L (ref 15–41)
Albumin: 3.4 g/dL — ABNORMAL LOW (ref 3.5–5.0)
Alkaline Phosphatase: 64 U/L (ref 38–126)
Anion gap: 4 — ABNORMAL LOW (ref 5–15)
BUN: 7 mg/dL (ref 6–20)
CO2: 27 mmol/L (ref 22–32)
Calcium: 8.7 mg/dL — ABNORMAL LOW (ref 8.9–10.3)
Chloride: 108 mmol/L (ref 98–111)
Creatinine: 0.95 mg/dL (ref 0.44–1.00)
GFR, Estimated: >60 mL/min (ref >60)
Glucose, Bld: 107 mg/dL — ABNORMAL HIGH (ref 70–99)
Potassium: 3.6 mmol/L (ref 3.5–5.1)
Sodium: 139 mmol/L (ref 135–145)
Total Bilirubin: 0.5 mg/dL (ref 0.3–1.2)
Total Protein: 6.2 g/dL — ABNORMAL LOW (ref 6.5–8.1)

## 2022-08-04 LAB — URINALYSIS, COMPLETE (UACMP) WITH MICROSCOPIC
Bilirubin Urine: NEGATIVE
Glucose, UA: NEGATIVE mg/dL
Hgb urine dipstick: NEGATIVE
Ketones, ur: NEGATIVE mg/dL
Leukocytes,Ua: NEGATIVE
Nitrite: NEGATIVE
Protein, ur: NEGATIVE mg/dL
Specific Gravity, Urine: 1.006 (ref 1.005–1.030)
pH: 5 (ref 5.0–8.0)

## 2022-08-04 MED ORDER — SODIUM CHLORIDE 0.9% FLUSH
10.0000 mL | Freq: Once | INTRAVENOUS | Status: AC
  Administered 2022-08-04: 10 mL

## 2022-08-04 MED ORDER — SODIUM CHLORIDE 0.9% FLUSH
10.0000 mL | INTRAVENOUS | Status: DC | PRN
  Administered 2022-08-04: 10 mL

## 2022-08-04 MED ORDER — HEPARIN SOD (PORK) LOCK FLUSH 100 UNIT/ML IV SOLN
500.0000 [IU] | Freq: Once | INTRAVENOUS | Status: AC | PRN
  Administered 2022-08-04: 500 [IU]

## 2022-08-04 MED ORDER — SODIUM CHLORIDE 0.9 % IV SOLN
200.0000 mg | Freq: Once | INTRAVENOUS | Status: AC
  Administered 2022-08-04: 200 mg via INTRAVENOUS
  Filled 2022-08-04: qty 200

## 2022-08-04 MED ORDER — SODIUM CHLORIDE 0.9 % IV SOLN: Freq: Once | INTRAVENOUS | Status: AC

## 2022-08-04 MED ORDER — PROCHLORPERAZINE MALEATE 10 MG PO TABS
10.0000 mg | ORAL_TABLET | Freq: Once | ORAL | Status: AC
  Administered 2022-08-04: 10 mg via ORAL
  Filled 2022-08-04: qty 1

## 2022-08-04 MED ORDER — SODIUM CHLORIDE 0.9 % IV SOLN
400.0000 mg/m2 | Freq: Once | INTRAVENOUS | Status: AC
  Administered 2022-08-04: 800 mg via INTRAVENOUS
  Filled 2022-08-04: qty 20

## 2022-08-04 NOTE — Progress Notes (Signed)
[facility] Telephone:([phone]) [phone]   Fax:([phone]) [phone]  OFFICE PROGRESS NOTE  [name], MD [address] [place] [address]  DIAGNOSIS: stage IV (T2a, N2, M1c) non-small cell lung cancer, adenocarcinoma presented with right upper lobe lung mass in addition to right hilar and mediastinal lymphadenopathy and innumerable bilateral pulmonary nodules as well as liver and bone metastasis diagnosed in August 2023.   Detected Alteration(s) / Biomarker(s) Associated FDA-approved therapies Clinical Trial Availability % cfDNA or Amplification  KRAS G12C  approved by FDA Adagrasib, Sotorasib Yes 5.3%  IDH1 R132L  approved in other indication Ivosidenib, Olutasidenib Yes 1.9%  PD-L1 expression 89%  PRIOR THERAPY: None  CURRENT THERAPY: Systemic chemotherapy with carboplatin for AUC of 5, Alimta 500 Mg/M2 and Keytruda 200 Mg IV every 3 weeks.  First dose September 02, 2021.  Status post 16 cycles.  Starting from cycle #5 she is on maintenance treatment with Alimta and Keytruda every 3 weeks.  INTERVAL HISTORY: Cassidy [name] 57 y.o. female returns to the clinic today for follow-up visit.  The patient is feeling fine today with no concerning complaints except for mild swelling of the lower extremities.  She has been tolerating her systemic chemotherapy fairly well.  She denied having any current chest pain, shortness of breath, cough or hemoptysis.  She has no nausea, vomiting, diarrhea or constipation.  She has no headache or visual changes.  She has no recent weight loss or night sweats.  She is here today for evaluation with repeat CT scan of the chest, abdomen and pelvis for restaging of her disease before starting cycle #17.  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  s/p PCTA to dRCA and ostial RPAV-PLA vessel + PDA branch (05/2012)  b. [place] s/p DES to mLAD - Resolute DES 3.0 x 22 (3.81mm -->3.3 mm)   Bipolar depression (HCC)    Breast abscess    a. right side.    COPD (chronic obstructive pulmonary disease) (HCC)    Depression    Diabetic peripheral neuropathy (HCC)    Fibromyalgia    Genital warts    GERD (gastroesophageal reflux disease)    History of hiatal hernia    HLD (hyperlipidemia)    Hypertension    Lung cancer (HCC)    Pain in limb    a. LE VENOUS DUPLEX, 02/05/2009 - no evidence of deep vein thrombosis, Baker's cyst   Pneumonia    PTSD (post-traumatic stress disorder)    Sickle cell trait (HCC)    Tobacco abuse    Tooth caries    Type II diabetes mellitus (HCC)     ALLERGIES:  is allergic to amoxil [amoxicillin], chantix [varenicline], suboxone [buprenorphine hcl-naloxone hcl], and emend [fosaprepitant dimeglumine].  MEDICATIONS:  Current Outpatient Medications  Medication Sig Dispense Refill   aspirin EC 81 MG tablet Take 81 mg by mouth daily.     atorvastatin (LIPITOR) 40 MG tablet TAKE 1 TABLET (40 MG TOTAL) BY MOUTH DAILY. 90 tablet 3   busPIRone (BUSPAR) 15 MG tablet Take 0.5 tablets (7.5 mg total) by mouth 2 (two) times daily. 60 tablet 3   Carboxymethylcellul-Glycerin (LUBRICATING EYE DROPS OP) Place 1 drop into both eyes daily as needed (dry eyes).  carvedilol (COREG) 12.5 MG tablet TAKE 1 TABLET (12.5 MG TOTAL) BY MOUTH 2 (TWO) TIMES DAILY WITH A MEAL. 180 tablet 1   diclofenac Sodium (VOLTAREN) 1 % GEL APPLY 1 APPLICATION TOPICALLY 3 TIMES DAILY AS NEEDED FOR PAIN 200 g 1   DULoxetine (CYMBALTA) 30 MG capsule Take 1 capsule (30 mg total) by mouth daily. 30 capsule 5   ferrous sulfate 325 (65 FE) MG tablet Take 325 mg by mouth daily with breakfast.     fluticasone (FLONASE) 50 MCG/ACT nasal spray PLACE 1 SPRAY INTO BOTH NOSTRILS DAILY AS NEEDED (CONGESTION). 16 g 1   fluticasone-salmeterol (ADVAIR) 100-50  MCG/ACT AEPB Inhale 1 puff into the lungs 2 (two) times daily. 1 each 3   folic acid (FOLVITE) 1 MG tablet Take 1 tablet (1 mg total) by mouth daily. 30 tablet 3   furosemide (LASIX) 40 MG tablet Take 1 tablet (40 mg total) by mouth daily as needed. If you gain 3 lbs in 24 hours or 5 lbs in 1 week 30 tablet 1   glucose blood (ACCU-CHEK AVIVA PLUS) test strip Use as instructed 50 each 1   hyoscyamine (LEVSIN SL) 0.125 MG SL tablet Place 1 tablet (0.125 mg total) under the tongue every 6 (six) hours as needed. For abdominal cramping 30 tablet 0   insulin glargine (LANTUS) 100 UNIT/ML Solostar Pen Inject 24 Units into the skin every morning. 3 mL 11   Insulin Pen Needle 32G X 4 MM MISC 1 Needle by Does not apply route in the morning and at bedtime. 120 each 2   ipratropium (ATROVENT HFA) 17 MCG/ACT inhaler Inhale 2 puffs into the lungs every 4 (four) hours as needed for wheezing (COPD).     lactulose (CHRONULAC) 10 GM/15ML solution Take 30 mLs (20 g total) by mouth 2 (two) times daily as needed for mild constipation, moderate constipation or severe constipation. 236 mL 0   lidocaine-prilocaine (EMLA) cream APPLY 1 APPLICATION TOPICALLY AS NEEDED. 30 g 2   LORazepam (ATIVAN) 0.5 MG tablet Take 1 tablet (0.5 mg total) by mouth every 8 (eight) hours as needed for anxiety (nausea). 30 tablet 0   metoCLOPramide (REGLAN) 10 MG tablet Take 1 tablet (10 mg total) by mouth 3 (three) times daily before meals. 60 tablet 0   nicotine polacrilex (NICORETTE) 2 MG gum Take 1 each (2 mg total) by mouth as needed for smoking cessation. 100 tablet 0   nitroGLYCERIN (NITROSTAT) 0.4 MG SL tablet Place 1 tablet (0.4 mg total) under the tongue every 5 (five) minutes as needed for chest pain. 25 tablet 5   nystatin (MYCOSTATIN) 100000 UNIT/ML suspension Take 5 mLs (500,000 Units total) by mouth 4 (four) times daily. 60 mL 0   ondansetron (ZOFRAN-ODT) 8 MG disintegrating tablet Take 1 tablet (8 mg total) by mouth every 8  (eight) hours as needed for nausea or vomiting. 30 tablet 2   oxyCODONE (OXY IR/ROXICODONE) 5 MG immediate release tablet Take 1-2 tablets (5-10 mg total) by mouth every 4 (four) hours as needed for severe pain. 90 tablet 0   oxyCODONE ER (XTAMPZA ER) 13.5 MG C12A Take 13.5 mg by mouth every 12 (twelve) hours. 60 capsule 0   pantoprazole (PROTONIX) 40 MG tablet Take 1 tablet (40 mg total) by mouth 2 (two) times daily. 60 tablet 5   polyethylene glycol powder (GAVILAX) 17 GM/SCOOP powder TAKE ONE CAPFUL (17 GRAMS )BY MOUTH DAILY AS NEEDED FOR MILD OR MODERATE CONSTIPATION. 510 g 1  potassium chloride SA (KLOR-CON M) 20 MEQ tablet Take 1 tablet (20 mEq total) by mouth daily as needed. On days you take Lasix 30 tablet 1   prochlorperazine (COMPAZINE) 10 MG tablet TAKE 1 TABLET (10 MG TOTAL) BY MOUTH EVERY 6 (SIX) HOURS AS NEEDED FOR NAUSEA OR VOMITING. 30 tablet 0   valACYclovir (VALTREX) 1000 MG tablet Take for 3 days prn each outbreak. 30 tablet 11   vitamin B-12 (CYANOCOBALAMIN) 1000 MCG tablet Take 1,000 mcg by mouth daily.     No current facility-administered medications for this visit.    SURGICAL HISTORY:  Past Surgical History:  Procedure Laterality Date   BLADDER SURGERY  1980   "TVT"   BLADDER SURGERY  2003   "TVT"   BREAST EXCISIONAL BIOPSY     BREAST SURGERY Right    I&D for multiple abscesses   CARDIAC CATHETERIZATION     CORONARY ARTERY BYPASS GRAFT N/A 06/30/2021   Procedure: CORONARY ARTERY BYPASS GRAFTING (CABG) x3 USING LEFT INTERNAL MAMMARY ARTERY,  RIGHT RADIAL ARTERY AND LEFT GREATER SAPHENOUS VEIN;  Surgeon: Corliss Skains, MD;  Location: [facility] OR;  Service: Open Heart Surgery;  Laterality: N/A;   cutting balloon     ENDOVEIN HARVEST OF GREATER SAPHENOUS VEIN Left 06/30/2021   Procedure: ENDOVEIN HARVEST OF GREATER SAPHENOUS VEIN;  Surgeon: Corliss Skains, MD;  Location: [facility] OR;  Service: Open Heart Surgery;  Laterality: Left;   EYE SURGERY Left    laser  surgery   FINE NEEDLE ASPIRATION  08/21/2021   Procedure: FINE NEEDLE ASPIRATION (FNA) LINEAR;  Surgeon: Josephine Igo, DO;  Location: [facility] ENDOS[facility];  Service: Pulmonary;;   INCISE AND DRAIN ABCESS  ; 04/26/11; 08/13/11   right breast   INCISION AND DRAINAGE ABSCESS Right 05/09/2012   Procedure: INCISION AND DRAINAGE RIGHT BREAST ABSCESS;  Surgeon: Wilmon Arms. Corliss Skains, MD;  Location: [facility] OR;  Service: General;  Laterality: Right;   INCISION AND DRAINAGE ABSCESS Right 02/08/2014   Procedure: INCISION AND DRAINAGE RIGHT BREAST ABSCESS;  Surgeon: Manus Rudd, MD;  Location: [facility];  Service: General;  Laterality: Right;   INCISION AND DRAINAGE ABSCESS Right 06/14/2014   Procedure: INCISION AND DRAINAGE RIGHT BREAST ABSCESS;  Surgeon: Manus Rudd, MD;  Location: [facility] OR;  Service: General;  Laterality: Right;   IR IMAGING GUIDED PORT INSERTION  12/08/2021   IRRIGATION AND DEBRIDEMENT ABSCESS  12/19/2010   Procedure: IRRIGATION AND DEBRIDEMENT ABSCESS;  Surgeon: Emelia Loron, MD;  Location: [facility];  Service: General;  Laterality: Right;   IRRIGATION AND DEBRIDEMENT ABSCESS  08/13/2011   Procedure: MINOR INCISION AND DRAINAGE OF ABSCESS;  Surgeon: Cherylynn Ridges, MD;  Location: [facility];  Service: General;  Laterality: Right;  Right Breast    IRRIGATION AND DEBRIDEMENT ABSCESS  11/17/2011   Procedure: IRRIGATION AND DEBRIDEMENT ABSCESS;  Surgeon: Wilmon Arms. Corliss Skains, MD;  Location: [facility];  Service: General;  Laterality: Right;  irrigation and debridement right recurrent breast abscess   LARYNX SURGERY     LEFT HEART CATH AND CORONARY ANGIOGRAPHY N/A 04/09/2021   Procedure: LEFT HEART CATH AND CORONARY ANGIOGRAPHY;  Surgeon: Corky Crafts, MD;  Location: [facility] INVASIVE CV LAB;  Service: Cardiovascular;  Laterality: N/A;   LEFT HEART CATHETERIZATION WITH CORONARY ANGIOGRAM N/A 05/29/2012   Procedure: LEFT HEART CATHETERIZATION WITH CORONARY ANGIOGRAM & PTCA;  Surgeon: Lennette Bihari, MD;  Location: [facility]  CATH LAB;  Service: Cardiovascular; Bifurcation dRCA-RPAD/PLA & rPDA  PTCA.   LEFT HEART CATHETERIZATION  WITH CORONARY ANGIOGRAM N/A 11/08/2013   Procedure: LEFT HEART CATHETERIZATION WITH CORONARY ANGIOGRAM and Coronary Stent Intervention;  Surgeon: Marykay Lex, MD;  Location: [facility] CATH LAB;  Service: Cardiovascular; mLAD 80% (FFR 0.73) - resolute DES 3.0 x 22 mm (postdilated to 3.3 mm->3.5 mm)   NM MYOVIEW LTD  01/26/2009   Normal study, no evidence of ischemia, EF 67%   ployp removed from voice box 03/30/12     RADIAL ARTERY HARVEST Right 06/30/2021   Procedure: RADIAL ARTERY HARVEST;  Surgeon: Corliss Skains, MD;  Location: [facility];  Service: Open Heart Surgery;  Laterality: Right;   REFRACTIVE SURGERY  ~ 2010   right   TEE WITHOUT CARDIOVERSION N/A 06/30/2021   Procedure: TRANSESOPHAGEAL ECHOCARDIOGRAM (TEE);  Surgeon: Corliss Skains, MD;  Location: [facility] OR;  Service: Open Heart Surgery;  Laterality: N/A;   TONSILLECTOMY  1988   VIDEO BRONCHOSCOPY WITH ENDOBRONCHIAL ULTRASOUND Bilateral 08/21/2021   Procedure: VIDEO BRONCHOSCOPY WITH ENDOBRONCHIAL ULTRASOUND;  Surgeon: Josephine Igo, DO;  Location: [facility];  Service: Pulmonary;  Laterality: Bilateral;    REVIEW OF SYSTEMS:  Constitutional: positive for fatigue Eyes: negative Ears, nose, mouth, throat, and face: negative Respiratory: negative Cardiovascular: negative Gastrointestinal: negative Genitourinary:negative Integument/breast: negative Hematologic/lymphatic: negative Musculoskeletal:positive for back pain Neurological: negative Behavioral/Psych: negative Endocrine: negative Allergic/Immunologic: negative   PHYSICAL EXAMINATION: General appearance: alert, cooperative, fatigued, and no distress Head: Normocephalic, without obvious abnormality, atraumatic Neck: no adenopathy, no JVD, supple, symmetrical, trachea midline, and thyroid not enlarged, symmetric, no tenderness/mass/nodules Lymph nodes: Cervical,  supraclavicular, and axillary nodes normal. Resp: clear to auscultation bilaterally Back: symmetric, no curvature. ROM normal. No CVA tenderness. Cardio: regular rate and rhythm, S1, S2 normal, no murmur, click, rub or gallop GI: soft, non-tender; bowel sounds normal; no masses,  no organomegaly Extremities: edema trace edema in the ankles Neurologic: Alert and oriented X 3, normal strength and tone. Normal symmetric reflexes. Normal coordination and gait  ECOG PERFORMANCE STATUS: 1 - Symptomatic but completely ambulatory  Blood pressure 96/62, pulse 71, temperature 97.8 F (36.6 C), temperature source Oral, resp. rate 17, height 5\' 7"  (1.702 m), weight 184 lb 9.6 oz (83.7 kg), last menstrual period 06/12/2016, SpO2 100%.  LABORATORY DATA: Lab Results  Component Value Date   WBC 4.3 07/14/2022   HGB 8.3 (L) 07/14/2022   HCT 24.5 (L) 07/14/2022   MCV 104.7 (H) 07/14/2022   PLT 306 07/14/2022      Chemistry      Component Value Date/Time   NA 139 07/14/2022 1103   NA 140 04/03/2021 1059   K 3.4 (L) 07/14/2022 1103   CL 107 07/14/2022 1103   CO2 28 07/14/2022 1103   BUN 10 07/14/2022 1103   BUN 13 04/03/2021 1059   CREATININE 1.02 (H) 07/14/2022 1103   CREATININE 0.65 11/06/2013 1520      Component Value Date/Time   CALCIUM 8.5 (L) 07/14/2022 1103   ALKPHOS 57 07/14/2022 1103   AST 20 07/14/2022 1103   ALT 15 07/14/2022 1103   BILITOT 0.5 07/14/2022 1103       RADIOGRAPHIC STUDIES: CT CHEST ABDOMEN PELVIS W CONTRAST  Result Date: 08/03/2022 CLINICAL DATA:  Non-small cell lung cancer. Post radiation treatment. * Tracking Code: BO * EXAM: CT CHEST, ABDOMEN, AND PELVIS WITH CONTRAST TECHNIQUE: Multidetector CT imaging of the chest, abdomen and pelvis was performed following the standard protocol during bolus administration of intravenous contrast. RADIATION DOSE REDUCTION: This exam was performed according to the departmental dose-optimization  program which includes  automated exposure control, adjustment of the mA and/or kV according to patient size and/or use of iterative reconstruction technique. CONTRAST:  OMNIPAQUE IOHEXOL 300 MG/ML  SOLN COMPARISON:  05/28/2022 FINDINGS: CT CHEST FINDINGS Cardiovascular: Post CABG.  No acute findings. Mediastinum/Nodes: No axillary or supraclavicular adenopathy. No mediastinal or hilar adenopathy. No pericardial fluid. Esophagus normal. Lungs/Pleura: Spiculated nodule in the RIGHT upper lobe measures 15 mm x 10 mm compared to 18 mm x 9 mm. Visually no interval change. no new pulmonary nodules. Mild atelectasis in the lung bases Musculoskeletal: No aggressive osseous lesion. CT ABDOMEN AND PELVIS FINDINGS Hepatobiliary: Hypodense lesion in the posterior aspect the RIGHT hepatic lobe measures 23 x 16 mm not changed from prior. Lesion stable over multiple comparison CT exams. No new hepatic lesion. Pancreas: Pancreas is normal. No ductal dilatation. No pancreatic inflammation. Spleen: Normal spleen Adrenals/urinary tract: RIGHT adrenal gland enlarged to 28 mm 20 mm compared to 26 mm x 19 mm for no interval change. Lesion stable over multiple comparison exams. LEFT adrenal gland normal. Kidneys, ureters and bladder normal. Stomach/Bowel: Stomach, small bowel, appendix, and cecum are normal. The colon and rectosigmoid colon are normal. Vascular/Lymphatic: Abdominal aorta is normal caliber with atherosclerotic calcification. There is no retroperitoneal or periportal lymphadenopathy. No pelvic lymphadenopathy. Reproductive: Uterus and adnexa unremarkable. Other: Anasarca within the subcutaneous tissues. Small volume intraperitoneal free fluid. No change Musculoskeletal: No aggressive osseous lesion. IMPRESSION: CHEST: 1. Stable spiculated nodule in the RIGHT upper lobe. 2. No evidence of thoracic metastatic disease. PELVIS: 1. No evidence of metastatic disease in the abdomen pelvis. 2. Stable RIGHT adrenal adenoma. 3. Stable hypodense  lesion in the RIGHT hepatic lobe. 4. Stable small volume intraperitoneal free fluid. 5.  Aortic Atherosclerosis (ICD10-I70.0). Electronically Signed   By: Genevive Bi M.D.   On: 08/03/2022 09:32    ASSESSMENT AND PLAN: This is a very pleasant 57 years old African-American female recently diagnosed with a stage IV (T2 a, N2, M1 C) non-small cell lung cancer, adenocarcinoma presented with right upper lobe lung mass in addition to right hilar and mediastinal lymphadenopathy as well as innumerable bilateral pulmonary nodules and bone and liver metastasis diagnosed in August 2023 with positive KRAS G12C mutation and PD-L1 expression of 89%. She is currently undergoing systemic chemotherapy with carboplatin for AUC of 5, Alimta 500 Mg/M2 and Keytruda 200 Mg IV every 3 weeks status post 16 cycles.  The patient has been tolerating this treatment well with no concerning adverse effect except for mild fatigue and swelling of the lower extremities. She had repeat CT scan of the chest, abdomen and pelvis performed recently.  I personally and independently reviewed the scan and discussed the result with the patient today. Her scan showed no concerning findings for disease progression. I recommended for her to continue her current treatment with maintenance Alimta and Keytruda and she will proceed with cycle #17 today. She will come back for follow-up visit in 3 weeks for evaluation before the next cycle of her treatment. For the swelling of the lower extremities, she will continue her current treatment with Lasix for now. For the lacrimation of the eye, she was advised to use over-the-counter saline eyedrops. For the dyspepsia and epigastric pain, she will continue her current treatment with Protonix. For the pain management she is followed by the palliative care team and currently on Xtampza ER 13.5 mg every 12 hours in addition to Oxy IR for breakthrough pain. The patient was advised to call  immediately if  she has any other concerning symptoms in the interval. The patient voices understanding of current disease status and treatment options and is in agreement with the current care plan.  All questions were answered. The patient knows to call the clinic with any problems, questions or concerns. We can certainly see the patient much sooner if necessary. The total time spent in the appointment was 30 minutes.  Disclaimer: This note was dictated with voice recognition software. Similar sounding words can inadvertently be transcribed and may not be corrected upon review.

## 2022-08-04 NOTE — Progress Notes (Signed)
Patient seen by Dr. Mohamed  Vitals are within treatment parameters.  Labs reviewed: and are within treatment parameters.  Per physician team, patient is ready for treatment and there are NO modifications to the treatment plan.  

## 2022-08-05 LAB — URINE CULTURE

## 2022-08-09 NOTE — Progress Notes (Deleted)
Cardiology Office Note:   Date:  08/09/2022  NAME:  Cassidy [name]    MRN: [id number] DOB:  01-30-1965   PCP:  [name], MD  Cardiologist:  Reatha Harps, MD  Electrophysiologist:  None   Referring MD: [name], MD   No chief complaint on file.   History of Present Illness:   Cassidy [name] is a 57 y.o. female with a hx of CAD s/p CABG, HTN, lung CA, DM who presents for follow-up.   Problem List CAD -2014: POBA dRCA/PDA/PLV -2015: PCI to mid LAD  -06/30/2021 -> CABG x 3 (LIMA-LAD, SVG-dRCA, SVG-OM) 2. HLD -T chol 131, HDL 60, LDL 56, triglycerides 77 3. DM -A1c 6.8 4. HTN 5. Tobacco abuse  -40 pack years 6. Lung adenocarcinoma, Stage IV -bone/lung/liver mets  -Dx 08/18/2021  Past Medical History: Past Medical History:  Diagnosis Date   Adenocarcinoma of right lung, stage 4 (HCC) 08/26/2021   Anemia    has sickle cell trait   Atherosclerotic heart disease of native coronary artery with angina pectoris (HCC) 05/2012, 10/2013   a.  s/p PCTA to dRCA and ostial RPAV-PLA vessel + PDA branch (05/2012)  b. [place] s/p DES to mLAD - Resolute DES 3.0 x 22 (3.55mm -->3.3 mm)   Bipolar depression (HCC)    Breast abscess    a. right side.    COPD (chronic obstructive pulmonary disease) (HCC)    Depression    Diabetic peripheral neuropathy (HCC)    Fibromyalgia    Genital warts    GERD (gastroesophageal reflux disease)    History of hiatal hernia    HLD (hyperlipidemia)    Hypertension    Lung cancer (HCC)    Pain in limb    a. LE VENOUS DUPLEX, 02/05/2009 - no evidence of deep vein thrombosis, Baker's cyst   Pneumonia    PTSD (post-traumatic stress disorder)    Sickle cell trait (HCC)    Tobacco abuse    Tooth caries    Type II diabetes mellitus (HCC)     Past Surgical History: Past Surgical History:  Procedure Laterality Date   BLADDER SURGERY  1980   "TVT"   BLADDER SURGERY  2003   "TVT"   BREAST EXCISIONAL BIOPSY     BREAST SURGERY Right    I&D for  multiple abscesses   CARDIAC CATHETERIZATION     CORONARY ARTERY BYPASS GRAFT N/A 06/30/2021   Procedure: CORONARY ARTERY BYPASS GRAFTING (CABG) x3 USING LEFT INTERNAL MAMMARY ARTERY,  RIGHT RADIAL ARTERY AND LEFT GREATER SAPHENOUS VEIN;  Surgeon: Corliss Skains, MD;  Location: [facility] OR;  Service: Open Heart Surgery;  Laterality: N/A;   cutting balloon     ENDOVEIN HARVEST OF GREATER SAPHENOUS VEIN Left 06/30/2021   Procedure: ENDOVEIN HARVEST OF GREATER SAPHENOUS VEIN;  Surgeon: Corliss Skains, MD;  Location: [facility] OR;  Service: Open Heart Surgery;  Laterality: Left;   EYE SURGERY Left    laser surgery   FINE NEEDLE ASPIRATION  08/21/2021   Procedure: FINE NEEDLE ASPIRATION (FNA) LINEAR;  Surgeon: Josephine Igo, DO;  Location: [facility];  Service: Pulmonary;;   INCISE AND DRAIN ABCESS  ; 04/26/11; 08/13/11   right breast   INCISION AND DRAINAGE ABSCESS Right 05/09/2012   Procedure: INCISION AND DRAINAGE RIGHT BREAST ABSCESS;  Surgeon: Wilmon Arms. Corliss Skains, MD;  Location: [facility];  Service: General;  Laterality: Right;   INCISION AND DRAINAGE ABSCESS Right 02/08/2014   Procedure: INCISION AND DRAINAGE RIGHT  BREAST ABSCESS;  Surgeon: Manus Rudd, MD;  Location: [facility] OR;  Service: General;  Laterality: Right;   INCISION AND DRAINAGE ABSCESS Right 06/14/2014   Procedure: INCISION AND DRAINAGE RIGHT BREAST ABSCESS;  Surgeon: Manus Rudd, MD;  Location: [facility];  Service: General;  Laterality: Right;   IR IMAGING GUIDED PORT INSERTION  12/08/2021   IRRIGATION AND DEBRIDEMENT ABSCESS  12/19/2010   Procedure: IRRIGATION AND DEBRIDEMENT ABSCESS;  Surgeon: Emelia Loron, MD;  Location: [facility];  Service: General;  Laterality: Right;   IRRIGATION AND DEBRIDEMENT ABSCESS  08/13/2011   Procedure: MINOR INCISION AND DRAINAGE OF ABSCESS;  Surgeon: Cherylynn Ridges, MD;  Location: [facility];  Service: General;  Laterality: Right;  Right Breast    IRRIGATION AND DEBRIDEMENT ABSCESS  11/17/2011   Procedure:  IRRIGATION AND DEBRIDEMENT ABSCESS;  Surgeon: Wilmon Arms. Corliss Skains, MD;  Location: [facility];  Service: General;  Laterality: Right;  irrigation and debridement right recurrent breast abscess   LARYNX SURGERY     LEFT HEART CATH AND CORONARY ANGIOGRAPHY N/A 04/09/2021   Procedure: LEFT HEART CATH AND CORONARY ANGIOGRAPHY;  Surgeon: Corky Crafts, MD;  Location: [facility] INVASIVE CV LAB;  Service: Cardiovascular;  Laterality: N/A;   LEFT HEART CATHETERIZATION WITH CORONARY ANGIOGRAM N/A 05/29/2012   Procedure: LEFT HEART CATHETERIZATION WITH CORONARY ANGIOGRAM & PTCA;  Surgeon: Lennette Bihari, MD;  Location: [facility] CATH LAB;  Service: Cardiovascular; Bifurcation dRCA-RPAD/PLA & rPDA  PTCA.   LEFT HEART CATHETERIZATION WITH CORONARY ANGIOGRAM N/A 11/08/2013   Procedure: LEFT HEART CATHETERIZATION WITH CORONARY ANGIOGRAM and Coronary Stent Intervention;  Surgeon: Marykay Lex, MD;  [facility] CATH LAB;  Service: Cardiovascular; mLAD 80% (FFR 0.73) - resolute DES 3.0 x 22 mm (postdilated to 3.3 mm->3.5 mm)   NM MYOVIEW LTD  01/26/2009   Normal study, no evidence of ischemia, EF 67%   ployp removed from voice box 03/30/12     RADIAL ARTERY HARVEST Right 06/30/2021   Procedure: RADIAL ARTERY HARVEST;  Surgeon: Corliss Skains, MD;  Location: [facility];  Service: Open Heart Surgery;  Laterality: Right;   REFRACTIVE SURGERY  ~ 2010   right   TEE WITHOUT CARDIOVERSION N/A 06/30/2021   Procedure: TRANSESOPHAGEAL ECHOCARDIOGRAM (TEE);  Surgeon: Corliss Skains, MD;  Location: [facility] OR;  Service: Open Heart Surgery;  Laterality: N/A;   TONSILLECTOMY  1988   VIDEO BRONCHOSCOPY WITH ENDOBRONCHIAL ULTRASOUND Bilateral 08/21/2021   Procedure: VIDEO BRONCHOSCOPY WITH ENDOBRONCHIAL ULTRASOUND;  Surgeon: Josephine Igo, DO;  Location: [facility];  Service: Pulmonary;  Laterality: Bilateral;    Current Medications: No outpatient medications have been marked as taking for the 08/11/22 encounter (Appointment) with  O'Neal, Ronnald Ramp, MD.     Allergies:    Amoxil [amoxicillin], Chantix [varenicline], Suboxone [buprenorphine hcl-naloxone hcl], and Emend [fosaprepitant dimeglumine]   Social History: Social History   Socioeconomic History   Marital status: Single    Spouse name: Not on file   Number of children: 1   Years of education: Not on file   Highest education level: Not on file  Occupational History   Occupation: homemaker   Occupation: disabled  Tobacco Use   Smoking status: Former    Current packs/day: 0.50    Average packs/day: 0.5 packs/day for 43.6 years (21.8 ttl pk-yrs)    Types: Cigarettes    Start date: 01/12/1979   Smokeless tobacco: Never   Tobacco comments:    08/14/11 "quit for 3 wk 05/2011; was smoking 1 ppd since  age 7 before I quit; at least I've cut down to 1/4 ppd",  Smoking 3 per day 08/20/2021. 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   Social Determinants of Health   Financial Resource Strain: High Risk (09/08/2021)   Overall Financial Resource Strain (CARDIA)    Difficulty of Paying Living Expenses: Very hard  Food Insecurity: Not on file  Transportation Needs: Not on file  Physical Activity: Not on file  Stress: Not on file  Social Connections: Not on file     Family History: The patient's family history  includes COPD in her mother; Cancer in her maternal aunt and mother; Crohn's disease in her maternal aunt; Diabetes in her maternal grandmother; Esophageal cancer in her mother; Heart disease in her father and mother; Hyperlipidemia in her father; Hypertension in her brother and maternal grandmother; Stomach cancer in her mother. There is no history of Rectal cancer, Liver cancer, Colon cancer, or Breast cancer.  ROS:   All other ROS reviewed and negative. Pertinent positives noted in the HPI.     EKGs/Labs/Other Studies Reviewed:   The following studies were personally reviewed by me today:  EKG:  EKG is *** ordered today.        Recent Labs: 06/22/2022: TSH 8.680 08/04/2022: ALT 14; BUN 7; Creatinine 0.95; Hemoglobin 9.0; Platelet Count 281; Potassium 3.6; Sodium 139   Recent Lipid Panel    Component Value Date/Time   CHOL 131 05/15/2021 1615   CHOL 132 08/07/2012 0905   TRIG 77 05/15/2021 1615   TRIG 73 08/07/2012 0905   HDL 60 05/15/2021 1615   HDL 42 08/07/2012 0905   CHOLHDL 2.2 05/15/2021 1615   CHOLHDL 4 11/29/2014 1140   VLDL 23.8 11/29/2014 1140   LDLCALC 56 05/15/2021 1615   LDLCALC 75 08/07/2012 0905    Physical Exam:   VS:  LMP 06/12/2016 (Approximate)    Wt Readings from Last 3 Encounters:  08/04/22 184 lb 9.6 oz (83.7 kg)  07/14/22 188 lb 6.4 oz (85.5 kg)  07/08/22 184 lb (83.5 kg)    General: Well nourished, well developed, in no acute distress Head: Atraumatic, normal size  Eyes: PEERLA, EOMI  Neck: Supple, no JVD Endocrine: No thryomegaly Cardiac: Normal S1, S2; RRR; no murmurs, rubs, or gallops Lungs: Clear to auscultation bilaterally, no wheezing, rhonchi or rales  Abd: Soft, nontender, no hepatomegaly  Ext: No edema, pulses 2+ Musculoskeletal: No deformities, BUE and BLE strength normal and equal Skin: Warm and dry, no rashes   Neuro: Alert and oriented to person, place, time, and situation, CNII-XII grossly intact, no focal deficits  Psych:  Normal mood and affect   ASSESSMENT:   Cassidy [name] is a 57 y.o. female who presents for the following: No diagnosis found.  PLAN:   There are no diagnoses linked to this encounter.  {Are you ordering a CV Procedure (e.g. stress test, cath, DCCV, TEE, etc)?   Press F2        :[id number]}  Disposition: No follow-ups on file.  Medication Adjustments/Labs and Tests Ordered: Current medicines are reviewed at length with the patient today.  Concerns regarding medicines are outlined above.  No orders of the defined types were placed in this encounter.  No orders of the defined types were placed in this encounter.  There are no Patient Instructions on file for this visit.   Time Spent with Patient: I have spent a total of *** minutes with patient reviewing hospital notes, telemetry, EKGs, labs and examining the patient as well as establishing an assessment and plan that was discussed with the patient.  > 50% of time was spent in direct patient care.  Signed, Lenna Gilford. Flora Lipps, MD, [facility]  [facility]  [address], [address] [place], [place] [address] [phone]  08/09/2022 2:55 PM

## 2022-08-11 ENCOUNTER — Ambulatory Visit: Payer: Medicaid Other | Attending: Cardiovascular Disease | Admitting: Cardiovascular Disease

## 2022-08-11 DIAGNOSIS — I10 Essential (primary) hypertension: Secondary | ICD-10-CM

## 2022-08-11 DIAGNOSIS — I25810 Atherosclerosis of coronary artery bypass graft(s) without angina pectoris: Secondary | ICD-10-CM

## 2022-08-11 DIAGNOSIS — E785 Hyperlipidemia, unspecified: Secondary | ICD-10-CM

## 2022-08-12 ENCOUNTER — Other Ambulatory Visit: Payer: Self-pay

## 2022-08-12 ENCOUNTER — Other Ambulatory Visit: Payer: Self-pay | Admitting: Cardiovascular Disease

## 2022-08-12 DIAGNOSIS — G893 Neoplasm related pain (acute) (chronic): Secondary | ICD-10-CM

## 2022-08-12 DIAGNOSIS — C3491 Malignant neoplasm of unspecified part of right bronchus or lung: Secondary | ICD-10-CM

## 2022-08-12 DIAGNOSIS — Z515 Encounter for palliative care: Secondary | ICD-10-CM

## 2022-08-12 MED ORDER — OXYCODONE HCL 5 MG PO TABS
5.0000 mg | ORAL_TABLET | ORAL | 0 refills | Status: DC | PRN
Start: 2022-08-12 — End: 2022-08-26

## 2022-08-12 MED ORDER — XTAMPZA ER 13.5 MG PO C12A
13.5000 mg | EXTENDED_RELEASE_CAPSULE | Freq: Two times a day (BID) | ORAL | 0 refills | Status: DC
Start: 2022-08-12 — End: 2022-09-14

## 2022-08-21 NOTE — Progress Notes (Unsigned)
[facility] OFFICE PROGRESS NOTE  [name], MD [address] [place] [address]  DIAGNOSIS: Stage IV (T2a, N2, M1c) non-small cell lung cancer, adenocarcinoma presented with right upper lobe lung mass in addition to right hilar and mediastinal lymphadenopathy and innumerable bilateral pulmonary nodules as well as liver and bone metastasis.  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    Detected Alteration(s) / Biomarker(s) Associated FDA-approved therapies  Clinical Trial Availability      % cfDNA or Amplification   KRAS G12C  approved by FDA Adagrasib, Sotorasib Yes   5.3%   IDH1 R132L  approved in other indication Ivosidenib, Olutasidenib Yes      1.9%   PD-L1 expression 89%   PRIOR THERAPY: Palliative radiation to the painful metastatic bone lesions at L4 and the sacrum under the care of Dr. Mitzi Hansen. Last day on 10/22/21   CURRENT THERAPY: 1) Systemic chemotherapy with carboplatin for AUC of 5, Alimta 500 Mg/M2 and Keytruda 200 Mg IV every 3 weeks.  First dose September 02, 2021.  Status post 17 cycles.  Starting from cycle #5, the patient will start maintenance Alimta and Keytruda IV every 3 weeks. Starting from cycle #10, reduced the dose of Alimta to 400 mg/m2 due to intolerance   INTERVAL HISTORY: Cassidy [name] 57 y.o. female returns to the clinic today for a follow-up visit.  The patient was last seen by Dr. [name] 3 weeks ago.    The patient has been feeling fair.  However, she has had some trouble with her chemotherapy with nausea.  Her Alimta was slightly dose reduced several cycles ago due to her nausea. The patient understands that she can be on single agent immunotherapy with Keytruda alone with her PDL1 expression of 89% should Alimta ever be intolerable.  The patient is concerned about possible disease progression; therefore, does not want to discontinue her Alimta.  Her Alimta also causes some skin darkening on her lower extremities.  She  also has been struggling with intermittent swelling/anasarca.  Her restaging scan from a few weeks ago did mention some anasarca.  The swelling in her foot/ankles sometimes affects her balance.  Overall, she states that her swelling is better today than at prior appointments but her swelling does bother her because it causes some soreness in her feet and ankles.  She does not have any compression stockings.  She is trying to cut out salty food from her diet.  She has also been losing weight and she was previously seen by member of the nutritionist team.  She reports that the weight loss is disturbing to her because she feels more "bony" and that causes her to have a hard time getting comfortable in bed.  She reports she does not have a strong appetite secondary to nausea.  She has several antiemetics.  She has a prescription for Reglan, Ativan, Zofran, and Compazine.  She also mention she has an area of erythema on her abdomen.  The area is warm to the touch and sore.  She states that this has been present for approximately 3 months or so.  She states that the area is actually better today than it was previously.  She does give herself insulin injections in her abdomen.  She tries not to give insulin over the area of discomfort.   Otherwise she denies any fever or night sweats.  She mentions that she sometimes has palpitations.  She was supposed to see her cardiologist within the  last few days but missed an appointment and is working on rescheduling it.  She denies any changes with her breathing.   She follows closely with palliative care for pain management constipation management.  She is scheduled to see them today.  She mention she would like a refill of a cream that she received from radiation oncology.  She does have the cream at home and is going to look at the tube and send a message to radiation oncology with the name of it.  She is here today for evaluation repeat blood work before undergoing her  next cycle of treatment.   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  s/p PCTA to dRCA and ostial RPAV-PLA vessel + PDA branch (05/2012)  b. [place] s/p DES to mLAD - Resolute DES 3.0 x 22 (3.39mm -->3.3 mm)   Bipolar depression (HCC)    Breast abscess    a. right side.    COPD (chronic obstructive pulmonary disease) (HCC)    Depression    Diabetic peripheral neuropathy (HCC)    Fibromyalgia    Genital warts    GERD (gastroesophageal reflux disease)    History of hiatal hernia    HLD (hyperlipidemia)    Hypertension    Lung cancer (HCC)    Pain in limb    a. LE VENOUS DUPLEX, 02/05/2009 - no evidence of deep vein thrombosis, Baker's cyst   Pneumonia    PTSD (post-traumatic stress disorder)    Sickle cell trait (HCC)    Tobacco abuse    Tooth caries    Type II diabetes mellitus (HCC)     ALLERGIES:  is allergic to amoxil [amoxicillin], chantix [varenicline], suboxone [buprenorphine hcl-naloxone hcl], and emend [fosaprepitant dimeglumine].  MEDICATIONS:  Current Outpatient Medications  Medication Sig Dispense Refill   aspirin EC 81 MG tablet Take 81 mg by mouth daily.     atorvastatin (LIPITOR) 40 MG tablet TAKE 1 TABLET (40 MG TOTAL) BY MOUTH DAILY. 90 tablet 3   busPIRone (BUSPAR) 15 MG tablet Take 0.5 tablets (7.5 mg total) by mouth 2 (two) times daily. 60 tablet 3   Carboxymethylcellul-Glycerin (LUBRICATING EYE DROPS OP) Place 1 drop into both eyes daily as needed (dry eyes).     carvedilol (COREG) 12.5 MG tablet TAKE 1 TABLET (12.5 MG TOTAL) BY MOUTH 2 (TWO) TIMES DAILY WITH A MEAL. 180 tablet 1   diclofenac Sodium (VOLTAREN) 1 % GEL APPLY 1 APPLICATION TOPICALLY 3 TIMES DAILY AS NEEDED FOR PAIN 200 g 1   DULoxetine (CYMBALTA) 30 MG capsule Take 1 capsule (30 mg total)  by mouth daily. 30 capsule 5   ferrous sulfate 325 (65 FE) MG tablet Take 325 mg by mouth daily with breakfast.     fluticasone (FLONASE) 50 MCG/ACT nasal spray PLACE 1 SPRAY INTO BOTH NOSTRILS DAILY AS NEEDED (CONGESTION). 16 g 1   fluticasone-salmeterol (ADVAIR) 100-50 MCG/ACT AEPB Inhale 1 puff into the lungs 2 (two) times daily. 1 each 3   folic acid (FOLVITE) 1 MG tablet Take 1 tablet (1 mg total) by mouth daily. 30 tablet 3   furosemide (LASIX) 40 MG tablet Take 1 tablet (40 mg total) by mouth daily as needed. If you gain 3 lbs in 24 hours or 5 lbs in 1  week 30 tablet 1   glucose blood (ACCU-CHEK AVIVA PLUS) test strip Use as instructed 50 each 1   hyoscyamine (LEVSIN SL) 0.125 MG SL tablet Place 1 tablet (0.125 mg total) under the tongue every 6 (six) hours as needed. For abdominal cramping 30 tablet 0   insulin glargine (LANTUS) 100 UNIT/ML Solostar Pen Inject 24 Units into the skin every morning. 3 mL 11   Insulin Pen Needle 32G X 4 MM MISC 1 Needle by Does not apply route in the morning and at bedtime. 120 each 2   ipratropium (ATROVENT HFA) 17 MCG/ACT inhaler Inhale 2 puffs into the lungs every 4 (four) hours as needed for wheezing (COPD).     lactulose (CHRONULAC) 10 GM/15ML solution Take 30 mLs (20 g total) by mouth 2 (two) times daily as needed for mild constipation, moderate constipation or severe constipation. 236 mL 0   lidocaine-prilocaine (EMLA) cream APPLY 1 APPLICATION TOPICALLY AS NEEDED. 30 g 2   LORazepam (ATIVAN) 0.5 MG tablet Take 1 tablet (0.5 mg total) by mouth every 8 (eight) hours as needed for anxiety (nausea). 30 tablet 0   metoCLOPramide (REGLAN) 10 MG tablet Take 1 tablet (10 mg total) by mouth 3 (three) times daily before meals. 60 tablet 0   nicotine polacrilex (NICORETTE) 2 MG gum Take 1 each (2 mg total) by mouth as needed for smoking cessation. 100 tablet 0   nitroGLYCERIN (NITROSTAT) 0.4 MG SL tablet Place 1 tablet (0.4 mg total) under the tongue every 5 (five)  minutes as needed for chest pain. 25 tablet 5   nystatin (MYCOSTATIN) 100000 UNIT/ML suspension Take 5 mLs (500,000 Units total) by mouth 4 (four) times daily. 60 mL 0   ondansetron (ZOFRAN-ODT) 8 MG disintegrating tablet Take 1 tablet (8 mg total) by mouth every 8 (eight) hours as needed for nausea or vomiting. 30 tablet 2   oxyCODONE (OXY IR/ROXICODONE) 5 MG immediate release tablet Take 1-2 tablets (5-10 mg total) by mouth every 4 (four) hours as needed for severe pain. 90 tablet 0   oxyCODONE ER (XTAMPZA ER) 13.5 MG C12A Take 13.5 mg by mouth every 12 (twelve) hours. 60 capsule 0   pantoprazole (PROTONIX) 40 MG tablet Take 1 tablet (40 mg total) by mouth 2 (two) times daily. 60 tablet 5   polyethylene glycol powder (GAVILAX) 17 GM/SCOOP powder TAKE ONE CAPFUL (17 GRAMS )BY MOUTH DAILY AS NEEDED FOR MILD OR MODERATE CONSTIPATION. 510 g 1   potassium chloride SA (KLOR-CON M) 20 MEQ tablet Take 1 tablet (20 mEq total) by mouth daily as needed. On days you take Lasix 30 tablet 1   prochlorperazine (COMPAZINE) 10 MG tablet TAKE 1 TABLET (10 MG TOTAL) BY MOUTH EVERY 6 (SIX) HOURS AS NEEDED FOR NAUSEA OR VOMITING. 30 tablet 0   valACYclovir (VALTREX) 1000 MG tablet Take for 3 days prn each outbreak. 30 tablet 11   vitamin B-12 (CYANOCOBALAMIN) 1000 MCG tablet Take 1,000 mcg by mouth daily.     No current facility-administered medications for this visit.    SURGICAL HISTORY:  Past Surgical History:  Procedure Laterality Date   BLADDER SURGERY  1980   "TVT"   BLADDER SURGERY  2003   "TVT"   BREAST EXCISIONAL BIOPSY     BREAST SURGERY Right    I&D for multiple abscesses   CARDIAC CATHETERIZATION     CORONARY ARTERY BYPASS GRAFT N/A 06/30/2021   Procedure: CORONARY ARTERY BYPASS GRAFTING (CABG) x3 USING LEFT INTERNAL MAMMARY ARTERY,  RIGHT RADIAL ARTERY AND LEFT GREATER SAPHENOUS VEIN;  Surgeon: Corliss Skains, MD;  Location: [facility] OR;  Service: Open Heart Surgery;  Laterality: N/A;   cutting  balloon     ENDOVEIN HARVEST OF GREATER SAPHENOUS VEIN Left 06/30/2021   Procedure: ENDOVEIN HARVEST OF GREATER SAPHENOUS VEIN;  Surgeon: Corliss Skains, MD;  Location: [facility] OR;  Service: Open Heart Surgery;  Laterality: Left;   EYE SURGERY Left    laser surgery   FINE NEEDLE ASPIRATION  08/21/2021   Procedure: FINE NEEDLE ASPIRATION (FNA) LINEAR;  Surgeon: Josephine Igo, DO;  Location: [facility] ENDOS[facility];  Service: Pulmonary;;   INCISE AND DRAIN ABCESS  ; 04/26/11; 08/13/11   right breast   INCISION AND DRAINAGE ABSCESS Right 05/09/2012   Procedure: INCISION AND DRAINAGE RIGHT BREAST ABSCESS;  Surgeon: Wilmon Arms. Corliss Skains, MD;  Location: [facility] OR;  Service: General;  Laterality: Right;   INCISION AND DRAINAGE ABSCESS Right 02/08/2014   Procedure: INCISION AND DRAINAGE RIGHT BREAST ABSCESS;  Surgeon: Manus Rudd, MD;  Location: [facility] OR;  Service: General;  Laterality: Right;   INCISION AND DRAINAGE ABSCESS Right 06/14/2014   Procedure: INCISION AND DRAINAGE RIGHT BREAST ABSCESS;  Surgeon: Manus Rudd, MD;  Location: [facility] OR;  Service: General;  Laterality: Right;   IR IMAGING GUIDED PORT INSERTION  12/08/2021   IRRIGATION AND DEBRIDEMENT ABSCESS  12/19/2010   Procedure: IRRIGATION AND DEBRIDEMENT ABSCESS;  Surgeon: Emelia Loron, MD;  Location: [facility] OR;  Service: General;  Laterality: Right;   IRRIGATION AND DEBRIDEMENT ABSCESS  08/13/2011   Procedure: MINOR INCISION AND DRAINAGE OF ABSCESS;  Surgeon: Cherylynn Ridges, MD;  Location: [facility] OR;  Service: General;  Laterality: Right;  Right Breast    IRRIGATION AND DEBRIDEMENT ABSCESS  11/17/2011   Procedure: IRRIGATION AND DEBRIDEMENT ABSCESS;  Surgeon: Wilmon Arms. Corliss Skains, MD;  Location: [facility] OR;  Service: General;  Laterality: Right;  irrigation and debridement right recurrent breast abscess   LARYNX SURGERY     LEFT HEART CATH AND CORONARY ANGIOGRAPHY N/A 04/09/2021   Procedure: LEFT HEART CATH AND CORONARY ANGIOGRAPHY;  Surgeon: Corky Crafts, MD;   Location: [facility] INVASIVE CV LAB;  Service: Cardiovascular;  Laterality: N/A;   LEFT HEART CATHETERIZATION WITH CORONARY ANGIOGRAM N/A 05/29/2012   Procedure: LEFT HEART CATHETERIZATION WITH CORONARY ANGIOGRAM & PTCA;  Surgeon: Lennette Bihari, MD;  Location: [facility] CATH LAB;  Service: Cardiovascular; Bifurcation dRCA-RPAD/PLA & rPDA  PTCA.   LEFT HEART CATHETERIZATION WITH CORONARY ANGIOGRAM N/A 11/08/2013   Procedure: LEFT HEART CATHETERIZATION WITH CORONARY ANGIOGRAM and Coronary Stent Intervention;  Surgeon: Marykay Lex, MD;  Location: [facility] CATH LAB;  Service: Cardiovascular; mLAD 80% (FFR 0.73) - resolute DES 3.0 x 22 mm (postdilated to 3.3 mm->3.5 mm)   NM MYOVIEW LTD  01/26/2009   Normal study, no evidence of ischemia, EF 67%   ployp removed from voice box 03/30/12     RADIAL ARTERY HARVEST Right 06/30/2021   Procedure: RADIAL ARTERY HARVEST;  Surgeon: Corliss Skains, MD;  Location: [facility] OR;  Service: Open Heart Surgery;  Laterality: Right;   REFRACTIVE SURGERY  ~ 2010   right   TEE WITHOUT CARDIOVERSION N/A 06/30/2021   Procedure: TRANSESOPHAGEAL ECHOCARDIOGRAM (TEE);  Surgeon: Corliss Skains, MD;  Location: [facility] OR;  Service: Open Heart Surgery;  Laterality: N/A;   TONSILLECTOMY  1988   VIDEO BRONCHOSCOPY WITH ENDOBRONCHIAL ULTRASOUND Bilateral 08/21/2021   Procedure: VIDEO BRONCHOSCOPY WITH ENDOBRONCHIAL ULTRASOUND;  Surgeon: Josephine Igo, DO;  Location: [facility] ENDOS[facility];  Service: Pulmonary;  Laterality: Bilateral;    REVIEW OF SYSTEMS:   Review of Systems  Constitutional: Positive for decreased appetite, fatigue, and weight loss.  Negative for  chills fever. HENT:  Negative for mouth sores, nosebleeds, sore throat and trouble swallowing.   Eyes: Negative for eye problems and icterus.  Respiratory: Negative for cough, hemoptysis, shortness of breath and wheezing.   Cardiovascular: Negative for chest pain.  Positive for leg swelling. Gastrointestinal: Positive for nausea and soreness  on her abdomen due to an area of erythema.  Negative for constipation, diarrhea, and vomiting.  Genitourinary: Negative for bladder incontinence, difficulty urinating, dysuria, frequency and hematuria.   Musculoskeletal: Positive for chronic back pain.  Negative for gait problem, neck pain and neck stiffness.  Skin: Today for skin darkening on her right foot.  Positive for darkening and erythema in the central abdominal area. Neurological: Negative for dizziness, extremity weakness, gait problem, headaches, light-headedness and seizures.  Hematological: Negative for adenopathy. Does not bruise/bleed easily.  Psychiatric/Behavioral: Negative for confusion, depression and sleep disturbance. The patient is not nervous/anxious.     PHYSICAL EXAMINATION:  Last menstrual period 06/12/2016.  ECOG PERFORMANCE STATUS: 1  Physical Exam  Constitutional: Oriented to person, place, and time and well-developed, well-nourished, and in no distress.  HENT:  Head: Normocephalic and atraumatic. I did not appreciate significant swelling of the right face or eyelid. No eye tearing at this time.  Mouth/Throat: Oropharynx is clear and moist. No oropharyngeal exudate.  Eyes: Conjunctivae are normal. Right eye exhibits no discharge. Left eye exhibits no discharge. No scleral icterus.  Neck: Normal range of motion. Neck supple.  Cardiovascular: Normal rate, regular rhythm, normal heart sounds and intact distal pulses.   Pulmonary/Chest: Effort normal and breath sounds normal. No respiratory distress. No wheezes. No rales.  Abdominal: Soft. Bowel sounds are normal. Exhibits no distension and no mass. There is no tenderness.  Musculoskeletal: Normal range of motion. Exhibits no edema.  Lymphadenopathy:    No cervical adenopathy.  Neurological: Alert and oriented to person, place, and time. Exhibits normal muscle tone. Gait normal. Coordination normal.  Skin: Skin is warm and dry.  + Darkening on right foot.  Some  skin darkening and erythema in the central abdominal area which is reportedly improved compared to prior although I have not seen the patient since April) not diaphoretic. No pallor.  Psychiatric: Normal for memory and judgment normal.  Vitals reviewed.  LABORATORY DATA: Lab Results  Component Value Date   WBC 3.6 (L) 08/04/2022   HGB 9.0 (L) 08/04/2022   HCT 26.5 (L) 08/04/2022   MCV 106.4 (H) 08/04/2022   PLT 281 08/04/2022      Chemistry      Component Value Date/Time   NA 139 08/04/2022 0927   NA 140 04/03/2021 1059   K 3.6 08/04/2022 0927   CL 108 08/04/2022 0927   CO2 27 08/04/2022 0927   BUN 7 08/04/2022 0927   BUN 13 04/03/2021 1059   CREATININE 0.95 08/04/2022 0927   CREATININE 0.65 11/06/2013 1520      Component Value Date/Time   CALCIUM 8.7 (L) 08/04/2022 0927   ALKPHOS 64 08/04/2022 0927   AST 20 08/04/2022 0927   ALT 14 08/04/2022 0927   BILITOT 0.5 08/04/2022 0927       RADIOGRAPHIC STUDIES:  CT CHEST ABDOMEN PELVIS W CONTRAST  Result Date: 08/03/2022 CLINICAL DATA:  Non-small cell lung cancer. Post radiation treatment. * Tracking Code: BO *  EXAM: CT CHEST, ABDOMEN, AND PELVIS WITH CONTRAST TECHNIQUE: Multidetector CT imaging of the chest, abdomen and pelvis was performed following the standard protocol during bolus administration of intravenous contrast. RADIATION DOSE REDUCTION: This exam was performed according to the departmental dose-optimization program which includes automated exposure control, adjustment of the mA and/or kV according to patient size and/or use of iterative reconstruction technique. CONTRAST:  OMNIPAQUE IOHEXOL 300 MG/ML  SOLN COMPARISON:  05/28/2022 FINDINGS: CT CHEST FINDINGS Cardiovascular: Post CABG.  No acute findings. Mediastinum/Nodes: No axillary or supraclavicular adenopathy. No mediastinal or hilar adenopathy. No pericardial fluid. Esophagus normal. Lungs/Pleura: Spiculated nodule in the RIGHT upper lobe measures 15 mm x  10 mm compared to 18 mm x 9 mm. Visually no interval change. no new pulmonary nodules. Mild atelectasis in the lung bases Musculoskeletal: No aggressive osseous lesion. CT ABDOMEN AND PELVIS FINDINGS Hepatobiliary: Hypodense lesion in the posterior aspect the RIGHT hepatic lobe measures 23 x 16 mm not changed from prior. Lesion stable over multiple comparison CT exams. No new hepatic lesion. Pancreas: Pancreas is normal. No ductal dilatation. No pancreatic inflammation. Spleen: Normal spleen Adrenals/urinary tract: RIGHT adrenal gland enlarged to 28 mm 20 mm compared to 26 mm x 19 mm for no interval change. Lesion stable over multiple comparison exams. LEFT adrenal gland normal. Kidneys, ureters and bladder normal. Stomach/Bowel: Stomach, small bowel, appendix, and cecum are normal. The colon and rectosigmoid colon are normal. Vascular/Lymphatic: Abdominal aorta is normal caliber with atherosclerotic calcification. There is no retroperitoneal or periportal lymphadenopathy. No pelvic lymphadenopathy. Reproductive: Uterus and adnexa unremarkable. Other: Anasarca within the subcutaneous tissues. Small volume intraperitoneal free fluid. No change Musculoskeletal: No aggressive osseous lesion. IMPRESSION: CHEST: 1. Stable spiculated nodule in the RIGHT upper lobe. 2. No evidence of thoracic metastatic disease. PELVIS: 1. No evidence of metastatic disease in the abdomen pelvis. 2. Stable RIGHT adrenal adenoma. 3. Stable hypodense lesion in the RIGHT hepatic lobe. 4. Stable small volume intraperitoneal free fluid. 5.  Aortic Atherosclerosis (ICD10-I70.0). Electronically Signed   By: Genevive Bi M.D.   On: 08/03/2022 09:32     ASSESSMENT/PLAN:  This is a very pleasant 57 year old African-American female diagnosed with stage IV (T2 a, N2, M1 C) non-small cell lung cancer, adenocarcinoma.  The patient presented with a right upper lobe lung mass in addition to right hilar and mediastinal lymphadenopathy.  She also has  innumerable bilateral pulmonary nodules, metastatic bone, liver, and adrenal lesions.  She was diagnosed in August 2023.  She is positive for K-ras G12 C mutation and her PD-L1 expression is 89%    The patient completed  palliative radiotherapy to the metastatic bone lesions at L4 and the sacrum under the care of Dr. Mitzi Hansen.  The last day radiation is tentatively scheduled for 10/22/21.    The patient is currently undergoing palliative systemic chemotherapy with carboplatin for an AUC of 5, Alimta 500 mg per metered square, Keytruda 200 mg IV every 3 weeks.  She is status post 17 cycles.  Starting from cycle #5 the patient started maintenance Alimta and Keytruda.  Starting from cycle #10, due to the patient's concern with quality of life versus quantity of life, her dose of chemotherapy was slightly reduced to 400 mg/m.  I also previously had discussed with her at length about discontinuing Alimta altogether and proceeding on single agent Keytruda.  I rediscussed this with her today (08/25/22).  She is worried about disease progression and would like to continue with both Alimta and Keytruda at  this time.  Labs were reviewed.  Recommend that she proceed with cycle number 18 today as scheduled.  Regarding her lower extremity swelling and skin darkening, both of these could be caused by her chemotherapy with Alimta.  However, the patient is very concerned about possible blood clot.  I have a low suspicion for blood clot given her imaging studies also shows some anasarca and her exam is most consistent with edema.  However, since the patient does have risk factors for blood clots such as malignancy and smoking, we will arrange for DVT study to rule out blood clot.  If this is negative, I encouraged the patient to take her Lasix daily as needed (she knows to increase potassium intake on these days), elevate her lower extremities, and I strongly encouraged her to purchase compression stockings to wear during the  daytime.  I also discussed increasing her protein intake in her diet and decreasing salt intake.  The patient does have an unusual area of darkening and erythema in her central abdomen.  Reportedly, this has been present for approximately 3 months and is significantly better at this time.  Unclear if there is some soft tissue infection component to this, possibly from giving herself insulin injections in this site.  I will send her prescription for doxycycline to take 1 tablet twice daily for 10 days.  The patient has an allergy to amoxicillin.  She knows to avoid sunlight while taking doxycycline.  She is going to look at the bottle of the cream that she received from radiation oncology that she wants refill of and send a MyChart message.  She is scheduled to see palliative care today to discuss her pain management and symptom management with nausea.  As previously mentioned, her nausea may improve with discontinuing her chemotherapy.  As of now, she will continue with her Compazine, Zofran, Ativan, and Reglan.  He was advised to take her antiemetic prior to eating to see if this helps with the aversions to eating.  She also mention she had a few episodes of palpitations at rest.  She did recently miss an appointment with cardiology.  She is going to reschedule her appointment and I strongly encouraged her to discuss her palpitations when she follows up with cardiology.  The patient was advised to call immediately if she has any concerning symptoms in the interval. The patient voices understanding of current disease status and treatment options and is in agreement with the current care plan. All questions were answered. The patient knows to call the clinic with any problems, questions or concerns. We can certainly see the patient much sooner if necessary     No orders of the defined types were placed in this encounter.    The total time spent in the appointment was 30-39 minutes   L  , PA-C 08/21/22

## 2022-08-23 NOTE — Progress Notes (Unsigned)
Palliative Medicine [facility]  [phone] [phone]   Name: Cassidy [name] Date: 08/23/2022 MRN: [id number]  DOB: [birth year]  Patient Care Team: [name], MD as PCP - General (Family Medicine) O'Neal, Ronnald Ramp, MD as PCP - Cardiology (Cardiology) Emelia Loron, MD as Consulting Physician (General Surgery)    INTERVAL HISTORY: Cassidy [name] is a 57 y.o. 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  Palliative ask to see for symptom management and goals of care.   SOCIAL HISTORY:    [name] reports that she has quit smoking. Her smoking use included cigarettes. She started smoking about 43 years ago. She has a 21.8 pack-year smoking history. She has never used smokeless tobacco. She reports that she does not currently use drugs. She reports that she does not drink alcohol.  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  s/p PCTA to dRCA and ostial RPAV-PLA vessel + PDA branch (05/2012)  b. [place] s/p DES to mLAD - Resolute DES 3.0 x 22 (3.49mm -->3.3 mm)   Bipolar depression (HCC)    Breast abscess    a. right side.    COPD (chronic obstructive pulmonary disease) (HCC)    Depression    Diabetic peripheral neuropathy (HCC)    Fibromyalgia    Genital warts    GERD (gastroesophageal reflux disease)    History of hiatal hernia    HLD (hyperlipidemia)    Hypertension    Lung cancer (HCC)    Pain in limb    a. LE VENOUS DUPLEX, 02/05/2009 - no evidence of deep vein thrombosis, Baker's cyst    Pneumonia    PTSD (post-traumatic stress disorder)    Sickle cell trait (HCC)    Tobacco abuse    Tooth caries    Type II diabetes mellitus (HCC)     ALLERGIES:  is allergic to amoxil [amoxicillin], chantix [varenicline], suboxone [buprenorphine hcl-naloxone hcl], and emend [fosaprepitant dimeglumine].  MEDICATIONS:  Current Outpatient Medications  Medication Sig Dispense Refill   aspirin EC 81 MG tablet Take 81 mg by mouth daily.     atorvastatin (LIPITOR) 40 MG tablet TAKE 1 TABLET (40 MG TOTAL) BY MOUTH DAILY. 90 tablet 3   busPIRone (BUSPAR) 15 MG tablet Take 0.5 tablets (7.5 mg total) by mouth 2 (two) times daily. 60 tablet 3   Carboxymethylcellul-Glycerin (LUBRICATING EYE DROPS OP) Place 1 drop into both eyes daily as needed (dry eyes).     carvedilol (COREG) 12.5 MG tablet TAKE 1 TABLET (12.5 MG TOTAL) BY MOUTH 2 (TWO) TIMES DAILY WITH A MEAL. 180 tablet 1   diclofenac Sodium (VOLTAREN) 1 % GEL APPLY 1 APPLICATION TOPICALLY 3 TIMES DAILY AS NEEDED FOR PAIN 200 g 1   DULoxetine (CYMBALTA) 30 MG capsule Take 1 capsule (30 mg total) by mouth daily. 30 capsule 5   ferrous sulfate 325 (65 FE) MG tablet Take 325 mg by mouth daily with breakfast.     fluticasone (FLONASE) 50 MCG/ACT nasal spray PLACE 1 SPRAY INTO BOTH NOSTRILS DAILY AS NEEDED (CONGESTION). 16 g 1  fluticasone-salmeterol (ADVAIR) 100-50 MCG/ACT AEPB Inhale 1 puff into the lungs 2 (two) times daily. 1 each 3   folic acid (FOLVITE) 1 MG tablet Take 1 tablet (1 mg total) by mouth daily. 30 tablet 3   furosemide (LASIX) 40 MG tablet Take 1 tablet (40 mg total) by mouth daily as needed. If you gain 3 lbs in 24 hours or 5 lbs in 1 week 30 tablet 1   glucose blood (ACCU-CHEK AVIVA PLUS) test strip Use as instructed 50 each 1   hyoscyamine (LEVSIN SL) 0.125 MG SL tablet Place 1 tablet (0.125 mg total) under the tongue every 6 (six) hours as needed. For abdominal cramping 30 tablet 0   insulin glargine (LANTUS) 100 UNIT/ML  Solostar Pen Inject 24 Units into the skin every morning. 3 mL 11   Insulin Pen Needle 32G X 4 MM MISC 1 Needle by Does not apply route in the morning and at bedtime. 120 each 2   ipratropium (ATROVENT HFA) 17 MCG/ACT inhaler Inhale 2 puffs into the lungs every 4 (four) hours as needed for wheezing (COPD).     lactulose (CHRONULAC) 10 GM/15ML solution Take 30 mLs (20 g total) by mouth 2 (two) times daily as needed for mild constipation, moderate constipation or severe constipation. 236 mL 0   lidocaine-prilocaine (EMLA) cream APPLY 1 APPLICATION TOPICALLY AS NEEDED. 30 g 2   LORazepam (ATIVAN) 0.5 MG tablet Take 1 tablet (0.5 mg total) by mouth every 8 (eight) hours as needed for anxiety (nausea). 30 tablet 0   metoCLOPramide (REGLAN) 10 MG tablet Take 1 tablet (10 mg total) by mouth 3 (three) times daily before meals. 60 tablet 0   nicotine polacrilex (NICORETTE) 2 MG gum Take 1 each (2 mg total) by mouth as needed for smoking cessation. 100 tablet 0   nitroGLYCERIN (NITROSTAT) 0.4 MG SL tablet Place 1 tablet (0.4 mg total) under the tongue every 5 (five) minutes as needed for chest pain. 25 tablet 5   nystatin (MYCOSTATIN) 100000 UNIT/ML suspension Take 5 mLs (500,000 Units total) by mouth 4 (four) times daily. 60 mL 0   ondansetron (ZOFRAN-ODT) 8 MG disintegrating tablet Take 1 tablet (8 mg total) by mouth every 8 (eight) hours as needed for nausea or vomiting. 30 tablet 2   oxyCODONE (OXY IR/ROXICODONE) 5 MG immediate release tablet Take 1-2 tablets (5-10 mg total) by mouth every 4 (four) hours as needed for severe pain. 90 tablet 0   oxyCODONE ER (XTAMPZA ER) 13.5 MG C12A Take 13.5 mg by mouth every 12 (twelve) hours. 60 capsule 0   pantoprazole (PROTONIX) 40 MG tablet Take 1 tablet (40 mg total) by mouth 2 (two) times daily. 60 tablet 5   polyethylene glycol powder (GAVILAX) 17 GM/SCOOP powder TAKE ONE CAPFUL (17 GRAMS )BY MOUTH DAILY AS NEEDED FOR MILD OR MODERATE CONSTIPATION. 510 g 1    potassium chloride SA (KLOR-CON M) 20 MEQ tablet Take 1 tablet (20 mEq total) by mouth daily as needed. On days you take Lasix 30 tablet 1   prochlorperazine (COMPAZINE) 10 MG tablet TAKE 1 TABLET (10 MG TOTAL) BY MOUTH EVERY 6 (SIX) HOURS AS NEEDED FOR NAUSEA OR VOMITING. 30 tablet 0   valACYclovir (VALTREX) 1000 MG tablet Take for 3 days prn each outbreak. 30 tablet 11   vitamin B-12 (CYANOCOBALAMIN) 1000 MCG tablet Take 1,000 mcg by mouth daily.     No current facility-administered medications for this visit.    VITAL SIGNS: LMP 06/12/2016 (Approximate)  There were no vitals filed for this visit.  Estimated body mass index is 28.91 kg/m as calculated from the following:   Height as of 08/04/22: 5\' 7"  (1.702 m).   Weight as of 08/04/22: 184 lb 9.6 oz (83.7 kg).   PERFORMANCE STATUS (ECOG) : 1 - Symptomatic but completely ambulatory   Physical Exam: General: NAD Pulmonary: normal breathing pattern  Extremities: bilateral ankle edema, no joint deformities Skin: no rashes Neurological: alert and oriented x 4, mood and affect appropriate   IMPRESSION: .  Ms. Overstreet presents to clinic for follow-up. No acute distress. Continues to be challenge with nausea at times. Endorses ongoing fatigue. Shares she is also concerned about ongoing lower extremity swelling. She was seen by [facility], [facility] today and this was addressed.   Neoplasm related pain Ms. Bussie reports that her pain is well controlled on current regimen. She is taking Xtampza 13 mg every 12 hours and Oxycodone 5-10 mg every 4 hours as needed for breakthrough pain. Does not require around the clock. Tolerating Cymbalta.   Complains of bilateral numbness and tingling in her feet. Some discoloration noted. States her gait is unsteady due to edema and pain. Unfortunately she was unable to tolerate gabapentin or pregabalin due to increased edema. She is soaking feet in epsom salt and using topical creams.     Is also using topical  Voltaren to hip area for aches during the evening and night time hours.   2.  Nausea Reports some nausea daily. States the feeling is nausea at times and times feels as though she has mucous in her chest area. Once she is able to cough or drink warm liquids the feeling somewhat resolves itself. Confirms that she is taking daily Protonix and antiemetics as needed.   Her weight has decreased to 171 lbs down from 184lbs on 7/24, 192lbs on 6/11. She is emotional expressing her knowledge of her weight loss and seeing herself in the mirror. Emotional support provided. We discussed use of appetite stimulant in addition to better managing nausea and reflux.   3. Constipation  Better controlled with regimen. Reports GI made some adjustments to regimen.   4. Goals of Care Ms. Kolin is emotional expressing understanding of cancer state. Shares emotionally her concerns of not "doing her best" right now. She knows that she has options to hold or discontinue treatments however expresses she is not ready to make this decision knowing her cancer would potentially progress. She is remaining hopeful for some improvement in her symptoms and quality of life. Emotional support provided.   We discussed the importance of continued conversation with family and their medical providers regarding overall plan of care and treatment options, ensuring decisions are within the context of the patients values and GOCs.  PLAN: Xtampza 13 mg every 12 hours. Oxycodone IR 10 mg every 4-6 hours as needed for breakthrough pain. Senna-S and Miralax on a daily basis for the management of constipation. Zofran, Compazine, and Ativan as needed for nausea Megace 20mg  BID  Ongoing emotional support and symptom management. Palliative will plan to see patient back in 3-4 weeks in collaboration with her oncology appointments.   Patient expressed understanding and was in agreement with this plan. She also understands that She can call the  clinic at any time with any questions, concerns, or complaints.    Any controlled substances utilized were prescribed in the context of palliative care. PDMP has been reviewed.   Any controlled substances utilized were prescribed in the  context of palliative care. PDMP has been reviewed.    Visit consisted of counseling and education dealing with the complex and emotionally intense issues of symptom management and palliative care in the setting of serious and potentially life-threatening illness.Greater than 50%  of this time was spent counseling and coordinating care related to the above assessment and plan.  Willette Alma, AGPCNP-BC  Palliative Medicine Team/ Cancer Center  *Please note that this is a verbal dictation therefore any spelling or grammatical errors are due to the "Dragon Medical One" system interpretation.

## 2022-08-25 ENCOUNTER — Other Ambulatory Visit: Payer: Self-pay

## 2022-08-25 ENCOUNTER — Inpatient Hospital Stay (HOSPITAL_BASED_OUTPATIENT_CLINIC_OR_DEPARTMENT_OTHER): Payer: Medicaid Other | Admitting: Physician Assistant

## 2022-08-25 ENCOUNTER — Inpatient Hospital Stay (HOSPITAL_BASED_OUTPATIENT_CLINIC_OR_DEPARTMENT_OTHER): Payer: Medicaid Other | Admitting: Nurse Practitioner

## 2022-08-25 ENCOUNTER — Encounter: Payer: Self-pay | Admitting: Nurse Practitioner

## 2022-08-25 ENCOUNTER — Inpatient Hospital Stay: Payer: Medicaid Other | Attending: Internal Medicine

## 2022-08-25 ENCOUNTER — Inpatient Hospital Stay: Payer: Medicaid Other

## 2022-08-25 ENCOUNTER — Other Ambulatory Visit: Payer: Medicaid Other

## 2022-08-25 VITALS — BP 138/76 | HR 91 | Temp 98.2°F | Resp 16 | Wt 171.2 lb

## 2022-08-25 DIAGNOSIS — C7972 Secondary malignant neoplasm of left adrenal gland: Secondary | ICD-10-CM | POA: Diagnosis not present

## 2022-08-25 DIAGNOSIS — Z5112 Encounter for antineoplastic immunotherapy: Secondary | ICD-10-CM | POA: Insufficient documentation

## 2022-08-25 DIAGNOSIS — Z95828 Presence of other vascular implants and grafts: Secondary | ICD-10-CM

## 2022-08-25 DIAGNOSIS — C3411 Malignant neoplasm of upper lobe, right bronchus or lung: Secondary | ICD-10-CM | POA: Insufficient documentation

## 2022-08-25 DIAGNOSIS — Z79620 Long term (current) use of immunosuppressive biologic: Secondary | ICD-10-CM | POA: Insufficient documentation

## 2022-08-25 DIAGNOSIS — R601 Generalized edema: Secondary | ICD-10-CM | POA: Insufficient documentation

## 2022-08-25 DIAGNOSIS — I10 Essential (primary) hypertension: Secondary | ICD-10-CM | POA: Insufficient documentation

## 2022-08-25 DIAGNOSIS — G893 Neoplasm related pain (acute) (chronic): Secondary | ICD-10-CM

## 2022-08-25 DIAGNOSIS — R2681 Unsteadiness on feet: Secondary | ICD-10-CM | POA: Insufficient documentation

## 2022-08-25 DIAGNOSIS — Z515 Encounter for palliative care: Secondary | ICD-10-CM | POA: Diagnosis not present

## 2022-08-25 DIAGNOSIS — Z880 Allergy status to penicillin: Secondary | ICD-10-CM | POA: Diagnosis not present

## 2022-08-25 DIAGNOSIS — Z79899 Other long term (current) drug therapy: Secondary | ICD-10-CM | POA: Insufficient documentation

## 2022-08-25 DIAGNOSIS — Z5111 Encounter for antineoplastic chemotherapy: Secondary | ICD-10-CM | POA: Diagnosis not present

## 2022-08-25 DIAGNOSIS — Z87891 Personal history of nicotine dependence: Secondary | ICD-10-CM | POA: Insufficient documentation

## 2022-08-25 DIAGNOSIS — C7971 Secondary malignant neoplasm of right adrenal gland: Secondary | ICD-10-CM | POA: Diagnosis not present

## 2022-08-25 DIAGNOSIS — R634 Abnormal weight loss: Secondary | ICD-10-CM | POA: Diagnosis not present

## 2022-08-25 DIAGNOSIS — Z9089 Acquired absence of other organs: Secondary | ICD-10-CM | POA: Insufficient documentation

## 2022-08-25 DIAGNOSIS — K5900 Constipation, unspecified: Secondary | ICD-10-CM | POA: Diagnosis not present

## 2022-08-25 DIAGNOSIS — C787 Secondary malignant neoplasm of liver and intrahepatic bile duct: Secondary | ICD-10-CM | POA: Insufficient documentation

## 2022-08-25 DIAGNOSIS — R630 Anorexia: Secondary | ICD-10-CM

## 2022-08-25 DIAGNOSIS — I700 Atherosclerosis of aorta: Secondary | ICD-10-CM | POA: Diagnosis not present

## 2022-08-25 DIAGNOSIS — R110 Nausea: Secondary | ICD-10-CM | POA: Insufficient documentation

## 2022-08-25 DIAGNOSIS — C3491 Malignant neoplasm of unspecified part of right bronchus or lung: Secondary | ICD-10-CM

## 2022-08-25 DIAGNOSIS — C7951 Secondary malignant neoplasm of bone: Secondary | ICD-10-CM | POA: Diagnosis not present

## 2022-08-25 DIAGNOSIS — Z923 Personal history of irradiation: Secondary | ICD-10-CM | POA: Diagnosis not present

## 2022-08-25 DIAGNOSIS — Z79631 Long term (current) use of antimetabolite agent: Secondary | ICD-10-CM | POA: Diagnosis not present

## 2022-08-25 LAB — CMP (CANCER CENTER ONLY)
ALT: 16 U/L (ref 0–44)
AST: 24 U/L (ref 15–41)
Albumin: 3.6 g/dL (ref 3.5–5.0)
Alkaline Phosphatase: 70 U/L (ref 38–126)
Anion gap: 5 (ref 5–15)
BUN: 8 mg/dL (ref 6–20)
CO2: 28 mmol/L (ref 22–32)
Calcium: 8.6 mg/dL — ABNORMAL LOW (ref 8.9–10.3)
Chloride: 105 mmol/L (ref 98–111)
Creatinine: 0.96 mg/dL (ref 0.44–1.00)
GFR, Estimated: >60 mL/min (ref >60)
Glucose, Bld: 141 mg/dL — ABNORMAL HIGH (ref 70–99)
Potassium: 3.9 mmol/L (ref 3.5–5.1)
Sodium: 138 mmol/L (ref 135–145)
Total Bilirubin: 0.6 mg/dL (ref 0.3–1.2)
Total Protein: 7.1 g/dL (ref 6.5–8.1)

## 2022-08-25 LAB — CBC WITH DIFFERENTIAL (CANCER CENTER ONLY)
Abs Immature Granulocytes: 0.02 10*3/uL (ref 0.00–0.07)
Basophils Absolute: 0 10*3/uL (ref 0.0–0.1)
Basophils Relative: 0 %
Eosinophils Absolute: 0 10*3/uL (ref 0.0–0.5)
Eosinophils Relative: 1 %
HCT: 30.2 % — ABNORMAL LOW (ref 36.0–46.0)
Hemoglobin: 10.5 g/dL — ABNORMAL LOW (ref 12.0–15.0)
Immature Granulocytes: 0 %
Lymphocytes Relative: 17 %
Lymphs Abs: 1 10*3/uL (ref 0.7–4.0)
MCH: 37.1 pg — ABNORMAL HIGH (ref 26.0–34.0)
MCHC: 34.8 g/dL (ref 30.0–36.0)
MCV: 106.7 fL — ABNORMAL HIGH (ref 80.0–100.0)
Monocytes Absolute: 0.8 10*3/uL (ref 0.1–1.0)
Monocytes Relative: 13 %
Neutro Abs: 4.2 10*3/uL (ref 1.7–7.7)
Neutrophils Relative %: 69 %
Platelet Count: 332 10*3/uL (ref 150–400)
RBC: 2.83 MIL/uL — ABNORMAL LOW (ref 3.87–5.11)
RDW: 15.3 % (ref 11.5–15.5)
WBC Count: 6.1 10*3/uL (ref 4.0–10.5)
nRBC: 0 % (ref 0.0–0.2)

## 2022-08-25 LAB — TSH: TSH: 8.471 u[IU]/mL — ABNORMAL HIGH (ref 0.350–4.500)

## 2022-08-25 MED ORDER — HEPARIN SOD (PORK) LOCK FLUSH 100 UNIT/ML IV SOLN
500.0000 [IU] | Freq: Once | INTRAVENOUS | Status: AC | PRN
  Administered 2022-08-25: 500 [IU]

## 2022-08-25 MED ORDER — SODIUM CHLORIDE 0.9 % IV SOLN
200.0000 mg | Freq: Once | INTRAVENOUS | Status: AC
  Administered 2022-08-25: 200 mg via INTRAVENOUS
  Filled 2022-08-25: qty 200

## 2022-08-25 MED ORDER — SODIUM CHLORIDE 0.9% FLUSH
10.0000 mL | INTRAVENOUS | Status: DC | PRN
  Administered 2022-08-25: 10 mL

## 2022-08-25 MED ORDER — CYANOCOBALAMIN 1000 MCG/ML IJ SOLN
1000.0000 ug | Freq: Once | INTRAMUSCULAR | Status: AC
  Administered 2022-08-25: 1000 ug via INTRAMUSCULAR
  Filled 2022-08-25: qty 1

## 2022-08-25 MED ORDER — RADIAPLEXRX EX GEL
Freq: Once | CUTANEOUS | Status: AC
  Filled 2022-08-25: qty 85

## 2022-08-25 MED ORDER — SODIUM CHLORIDE 0.9 % IV SOLN
400.0000 mg/m2 | Freq: Once | INTRAVENOUS | Status: AC
  Administered 2022-08-25: 800 mg via INTRAVENOUS
  Filled 2022-08-25: qty 20

## 2022-08-25 MED ORDER — DOXYCYCLINE HYCLATE 100 MG PO TABS
100.0000 mg | ORAL_TABLET | Freq: Two times a day (BID) | ORAL | 0 refills | Status: DC
Start: 2022-08-25 — End: 2022-10-01

## 2022-08-25 MED ORDER — SODIUM CHLORIDE 0.9% FLUSH
10.0000 mL | Freq: Once | INTRAVENOUS | Status: AC
  Administered 2022-08-25: 10 mL

## 2022-08-25 MED ORDER — PROCHLORPERAZINE MALEATE 10 MG PO TABS
10.0000 mg | ORAL_TABLET | Freq: Once | ORAL | Status: AC
  Administered 2022-08-25: 10 mg via ORAL
  Filled 2022-08-25: qty 1

## 2022-08-25 MED ORDER — SODIUM CHLORIDE 0.9 % IV SOLN: Freq: Once | INTRAVENOUS | Status: AC

## 2022-08-25 NOTE — Patient Instructions (Signed)
[facility]  Discharge Instructions: Thank you for choosing [facility] to provide your oncology and hematology care.   If you have a lab appointment with the Cancer Center, please go directly to the Cancer Center and check in at the registration area.   Wear comfortable clothing and clothing appropriate for easy access to any Portacath or PICC line.   We strive to give you quality time with your provider. You may need to reschedule your appointment if you arrive late (15 or more minutes).  Arriving late affects you and other patients whose appointments are after yours.  Also, if you miss three or more appointments without notifying the office, you may be dismissed from the clinic at the provider's discretion.      For prescription refill requests, have your pharmacy contact our office and allow 72 hours for refills to be completed.    Today you received the following chemotherapy and/or immunotherapy agents :  Pembrolizumab & Pemetrexed.       To help prevent nausea and vomiting after your treatment, we encourage you to take your nausea medication as directed.  BELOW ARE SYMPTOMS THAT SHOULD BE REPORTED IMMEDIATELY: *FEVER GREATER THAN 100.4 F (38 C) OR HIGHER *CHILLS OR SWEATING *NAUSEA AND VOMITING THAT IS NOT CONTROLLED WITH YOUR NAUSEA MEDICATION *UNUSUAL SHORTNESS OF BREATH *UNUSUAL BRUISING OR BLEEDING *URINARY PROBLEMS (pain or burning when urinating, or frequent urination) *BOWEL PROBLEMS (unusual diarrhea, constipation, pain near the anus) TENDERNESS IN MOUTH AND THROAT WITH OR WITHOUT PRESENCE OF ULCERS (sore throat, sores in mouth, or a toothache) UNUSUAL RASH, SWELLING OR PAIN  UNUSUAL VAGINAL DISCHARGE OR ITCHING   Items with * indicate a potential emergency and should be followed up as soon as possible or go to the Emergency Department if any problems should occur.  Please show the CHEMOTHERAPY ALERT CARD or  IMMUNOTHERAPY ALERT CARD at check-in to the Emergency Department and triage nurse.  Should you have questions after your visit or need to cancel or reschedule your appointment, please contact [facility]  Dept: [phone]  and follow the prompts.  Office hours are 8:00 a.m. to 4:30 p.m. Monday - Friday. Please note that voicemails left after 4:00 p.m. may not be returned until the following business day.  We are closed weekends and major holidays. You have access to a nurse at all times for urgent questions. Please call the main number to the clinic Dept: [phone] and follow the prompts.   For any non-urgent questions, you may also contact your provider using MyChart. We now offer e-Visits for anyone 18 and older to request care online for non-urgent symptoms. For details visit [email or web address].   Also download the MyChart app! Go to the app store, search "MyChart", open the app, select [facility], and log in with your MyChart username and password.   

## 2022-08-26 ENCOUNTER — Encounter: Payer: Self-pay | Admitting: Internal Medicine

## 2022-08-26 LAB — T4: T4, Total: 10 ug/dL (ref 4.5–12.0)

## 2022-08-26 MED ORDER — MEGESTROL ACETATE 20 MG PO TABS
20.0000 mg | ORAL_TABLET | Freq: Two times a day (BID) | ORAL | 0 refills | Status: DC
Start: 2022-08-26 — End: 2022-11-22

## 2022-08-26 MED ORDER — OXYCODONE HCL 10 MG PO TABS
10.0000 mg | ORAL_TABLET | ORAL | 0 refills | Status: DC | PRN
Start: 2022-08-26 — End: 2022-10-21

## 2022-08-27 ENCOUNTER — Ambulatory Visit (HOSPITAL_COMMUNITY)
Admission: RE | Admit: 2022-08-27 | Discharge: 2022-08-27 | Disposition: A | Discharge status: Home / Self Care | Payer: Medicaid Other | Source: Ambulatory Visit | Attending: Physician Assistant | Admitting: Physician Assistant

## 2022-08-27 ENCOUNTER — Telehealth: Payer: Self-pay | Admitting: Physician Assistant

## 2022-08-27 DIAGNOSIS — C3491 Malignant neoplasm of unspecified part of right bronchus or lung: Secondary | ICD-10-CM

## 2022-08-27 NOTE — Telephone Encounter (Signed)
I called the patient and let her know that her doppler did not show any DVT. She was appreciative of the call.

## 2022-09-14 ENCOUNTER — Other Ambulatory Visit: Payer: Self-pay | Admitting: Nurse Practitioner

## 2022-09-14 DIAGNOSIS — Z515 Encounter for palliative care: Secondary | ICD-10-CM

## 2022-09-14 DIAGNOSIS — C3491 Malignant neoplasm of unspecified part of right bronchus or lung: Secondary | ICD-10-CM

## 2022-09-14 DIAGNOSIS — G893 Neoplasm related pain (acute) (chronic): Secondary | ICD-10-CM

## 2022-09-15 ENCOUNTER — Inpatient Hospital Stay (HOSPITAL_BASED_OUTPATIENT_CLINIC_OR_DEPARTMENT_OTHER): Payer: Medicaid Other | Admitting: Internal Medicine

## 2022-09-15 ENCOUNTER — Inpatient Hospital Stay: Payer: Medicaid Other | Attending: Internal Medicine

## 2022-09-15 ENCOUNTER — Inpatient Hospital Stay: Payer: Medicaid Other

## 2022-09-15 ENCOUNTER — Encounter: Payer: Self-pay | Admitting: Medical Oncology

## 2022-09-15 VITALS — BP 125/81 | HR 75 | Temp 97.4°F | Resp 17 | Ht 67.0 in | Wt 176.1 lb

## 2022-09-15 DIAGNOSIS — C3411 Malignant neoplasm of upper lobe, right bronchus or lung: Secondary | ICD-10-CM | POA: Insufficient documentation

## 2022-09-15 DIAGNOSIS — Z87891 Personal history of nicotine dependence: Secondary | ICD-10-CM | POA: Diagnosis not present

## 2022-09-15 DIAGNOSIS — Z8774 Personal history of (corrected) congenital malformations of heart and circulatory system: Secondary | ICD-10-CM | POA: Diagnosis not present

## 2022-09-15 DIAGNOSIS — C3491 Malignant neoplasm of unspecified part of right bronchus or lung: Secondary | ICD-10-CM

## 2022-09-15 DIAGNOSIS — Z9089 Acquired absence of other organs: Secondary | ICD-10-CM | POA: Insufficient documentation

## 2022-09-15 DIAGNOSIS — Z79631 Long term (current) use of antimetabolite agent: Secondary | ICD-10-CM | POA: Diagnosis not present

## 2022-09-15 DIAGNOSIS — Z5111 Encounter for antineoplastic chemotherapy: Secondary | ICD-10-CM | POA: Insufficient documentation

## 2022-09-15 DIAGNOSIS — Z880 Allergy status to penicillin: Secondary | ICD-10-CM | POA: Diagnosis not present

## 2022-09-15 DIAGNOSIS — E1142 Type 2 diabetes mellitus with diabetic polyneuropathy: Secondary | ICD-10-CM | POA: Diagnosis not present

## 2022-09-15 DIAGNOSIS — Z5112 Encounter for antineoplastic immunotherapy: Secondary | ICD-10-CM | POA: Diagnosis present

## 2022-09-15 DIAGNOSIS — Z95828 Presence of other vascular implants and grafts: Secondary | ICD-10-CM

## 2022-09-15 DIAGNOSIS — C787 Secondary malignant neoplasm of liver and intrahepatic bile duct: Secondary | ICD-10-CM | POA: Insufficient documentation

## 2022-09-15 DIAGNOSIS — Z79620 Long term (current) use of immunosuppressive biologic: Secondary | ICD-10-CM | POA: Insufficient documentation

## 2022-09-15 DIAGNOSIS — K5900 Constipation, unspecified: Secondary | ICD-10-CM | POA: Insufficient documentation

## 2022-09-15 DIAGNOSIS — Z79899 Other long term (current) drug therapy: Secondary | ICD-10-CM | POA: Diagnosis not present

## 2022-09-15 LAB — CBC WITH DIFFERENTIAL (CANCER CENTER ONLY)
Abs Immature Granulocytes: 0.03 10*3/uL (ref 0.00–0.07)
Basophils Absolute: 0 10*3/uL (ref 0.0–0.1)
Basophils Relative: 0 %
Eosinophils Absolute: 0 10*3/uL (ref 0.0–0.5)
Eosinophils Relative: 1 %
HCT: 27.2 % — ABNORMAL LOW (ref 36.0–46.0)
Hemoglobin: 9.4 g/dL — ABNORMAL LOW (ref 12.0–15.0)
Immature Granulocytes: 1 %
Lymphocytes Relative: 15 %
Lymphs Abs: 0.8 10*3/uL (ref 0.7–4.0)
MCH: 36.9 pg — ABNORMAL HIGH (ref 26.0–34.0)
MCHC: 34.6 g/dL (ref 30.0–36.0)
MCV: 106.7 fL — ABNORMAL HIGH (ref 80.0–100.0)
Monocytes Absolute: 0.8 10*3/uL (ref 0.1–1.0)
Monocytes Relative: 15 %
Neutro Abs: 3.9 10*3/uL (ref 1.7–7.7)
Neutrophils Relative %: 68 %
Platelet Count: 317 10*3/uL (ref 150–400)
RBC: 2.55 MIL/uL — ABNORMAL LOW (ref 3.87–5.11)
RDW: 15.3 % (ref 11.5–15.5)
WBC Count: 5.7 10*3/uL (ref 4.0–10.5)
nRBC: 0 % (ref 0.0–0.2)

## 2022-09-15 LAB — CMP (CANCER CENTER ONLY)
ALT: 14 U/L (ref 0–44)
AST: 20 U/L (ref 15–41)
Albumin: 3.2 g/dL — ABNORMAL LOW (ref 3.5–5.0)
Alkaline Phosphatase: 61 U/L (ref 38–126)
Anion gap: 5 (ref 5–15)
BUN: 11 mg/dL (ref 6–20)
CO2: 27 mmol/L (ref 22–32)
Calcium: 8.5 mg/dL — ABNORMAL LOW (ref 8.9–10.3)
Chloride: 106 mmol/L (ref 98–111)
Creatinine: 1.04 mg/dL — ABNORMAL HIGH (ref 0.44–1.00)
GFR, Estimated: >60 mL/min (ref >60)
Glucose, Bld: 180 mg/dL — ABNORMAL HIGH (ref 70–99)
Potassium: 3.6 mmol/L (ref 3.5–5.1)
Sodium: 138 mmol/L (ref 135–145)
Total Bilirubin: 0.5 mg/dL (ref 0.3–1.2)
Total Protein: 6.2 g/dL — ABNORMAL LOW (ref 6.5–8.1)

## 2022-09-15 MED ORDER — SODIUM CHLORIDE 0.9 % IV SOLN
400.0000 mg/m2 | Freq: Once | INTRAVENOUS | Status: AC
  Administered 2022-09-15: 800 mg via INTRAVENOUS
  Filled 2022-09-15: qty 20

## 2022-09-15 MED ORDER — SODIUM CHLORIDE 0.9 % IV SOLN: Freq: Once | INTRAVENOUS | Status: AC

## 2022-09-15 MED ORDER — SODIUM CHLORIDE 0.9% FLUSH
10.0000 mL | INTRAVENOUS | Status: DC | PRN
  Administered 2022-09-15: 10 mL

## 2022-09-15 MED ORDER — HEPARIN SOD (PORK) LOCK FLUSH 100 UNIT/ML IV SOLN
500.0000 [IU] | Freq: Once | INTRAVENOUS | Status: AC | PRN
  Administered 2022-09-15: 500 [IU]

## 2022-09-15 MED ORDER — PROCHLORPERAZINE MALEATE 10 MG PO TABS
10.0000 mg | ORAL_TABLET | Freq: Once | ORAL | Status: AC
  Administered 2022-09-15: 10 mg via ORAL
  Filled 2022-09-15: qty 1

## 2022-09-15 MED ORDER — SODIUM CHLORIDE 0.9% FLUSH
10.0000 mL | Freq: Once | INTRAVENOUS | Status: AC
  Administered 2022-09-15: 10 mL

## 2022-09-15 MED ORDER — SODIUM CHLORIDE 0.9 % IV SOLN
200.0000 mg | Freq: Once | INTRAVENOUS | Status: AC
  Administered 2022-09-15: 200 mg via INTRAVENOUS
  Filled 2022-09-15: qty 200

## 2022-09-15 NOTE — Progress Notes (Signed)
Palliative Medicine [facility]  [phone] [phone]   Name: Cassidy [name] Date: 09/15/2022 MRN: [id number]  DOB: 1965-08-13  Patient Care Team: [name], MD as PCP - General (Family Medicine) O'Neal, Ronnald Ramp, MD as PCP - Cardiology (Cardiology) Emelia Loron, MD as Consulting Physician (General Surgery)   I connected with Cassidy [name] on 09/15/22 at 10:00 AM EDT by phone and verified that I am speaking with the correct person using two identifiers.   I discussed the limitations, risks, security and privacy concerns of performing an evaluation and management service by telemedicine and the availability of in-person appointments. I also discussed with the patient that there may be a patient responsible charge related to this service. The patient expressed understanding and agreed to proceed.   Other persons participating in the visit and their role in the encounter: n/a   Patient's location: home  Provider's location: [facility]   Chief Complaint: f/u of symptom management    INTERVAL HISTORY: Cassidy [name] is a 57 y.o. 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  Palliative ask to see for symptom management and goals of care.   SOCIAL HISTORY:    Cassidy [name] reports that she has quit smoking. Her smoking use included cigarettes. She started smoking about 43 years ago. She has a 21.8 pack-year smoking history. She has never used smokeless tobacco. She reports that she does not currently use drugs. She reports that she does not drink alcohol.  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  s/p PCTA to dRCA and ostial RPAV-PLA vessel + PDA branch (05/2012)  b. [place] s/p DES to mLAD - Resolute DES 3.0 x 22 (3.25mm -->3.3 mm)   Bipolar depression (HCC)    Breast abscess    a. right side.    COPD (chronic obstructive pulmonary disease) (HCC)    Depression    Diabetic peripheral neuropathy (HCC)    Fibromyalgia    Genital warts    GERD (gastroesophageal reflux disease)    History of hiatal hernia    HLD (hyperlipidemia)    Hypertension    Lung cancer (HCC)    Pain in limb    a. LE VENOUS DUPLEX, 02/05/2009 - no evidence of deep vein thrombosis, Baker's cyst   Pneumonia    PTSD (post-traumatic stress disorder)    Sickle cell trait (HCC)    Tobacco abuse    Tooth caries    Type II diabetes mellitus (HCC)     ALLERGIES:  is allergic to amoxil [amoxicillin], chantix [varenicline], suboxone [buprenorphine hcl-naloxone hcl], and emend [fosaprepitant dimeglumine].  MEDICATIONS:  Current Outpatient Medications  Medication Sig Dispense Refill   aspirin EC 81 MG tablet Take 81 mg by mouth daily.     atorvastatin (LIPITOR) 40 MG tablet TAKE 1 TABLET (40 MG TOTAL) BY MOUTH DAILY. 90 tablet 3   busPIRone (BUSPAR) 15 MG tablet Take 0.5 tablets (7.5 mg total) by mouth 2 (two) times daily. 60 tablet 3   Carboxymethylcellul-Glycerin (LUBRICATING EYE DROPS OP) Place 1 drop into both eyes  daily as needed (dry eyes).     carvedilol (COREG) 12.5 MG tablet TAKE 1 TABLET (12.5 MG TOTAL) BY MOUTH 2 (TWO) TIMES DAILY WITH A MEAL. 180 tablet 1   diclofenac Sodium (VOLTAREN) 1 % GEL APPLY 1 APPLICATION TOPICALLY 3 TIMES DAILY AS NEEDED FOR PAIN 200 g 1   doxycycline (VIBRA-TABS) 100 MG tablet Take 1 tablet (100 mg total) by mouth 2 (two) times daily. 20 tablet 0   DULoxetine (CYMBALTA) 30 MG capsule Take 1 capsule (30 mg total) by mouth daily. 30 capsule 5   ferrous sulfate 325 (65 FE) MG tablet  Take 325 mg by mouth daily with breakfast.     fluticasone (FLONASE) 50 MCG/ACT nasal spray PLACE 1 SPRAY INTO BOTH NOSTRILS DAILY AS NEEDED (CONGESTION). 16 g 1   fluticasone-salmeterol (ADVAIR) 100-50 MCG/ACT AEPB Inhale 1 puff into the lungs 2 (two) times daily. 1 each 3   folic acid (FOLVITE) 1 MG tablet Take 1 tablet (1 mg total) by mouth daily. 30 tablet 3   furosemide (LASIX) 40 MG tablet Take 1 tablet (40 mg total) by mouth daily as needed. If you gain 3 lbs in 24 hours or 5 lbs in 1 week 30 tablet 1   glucose blood (ACCU-CHEK AVIVA PLUS) test strip Use as instructed 50 each 1   hyoscyamine (LEVSIN SL) 0.125 MG SL tablet Place 1 tablet (0.125 mg total) under the tongue every 6 (six) hours as needed. For abdominal cramping 30 tablet 0   insulin glargine (LANTUS) 100 UNIT/ML Solostar Pen Inject 24 Units into the skin every morning. 3 mL 11   Insulin Pen Needle 32G X 4 MM MISC 1 Needle by Does not apply route in the morning and at bedtime. 120 each 2   ipratropium (ATROVENT HFA) 17 MCG/ACT inhaler Inhale 2 puffs into the lungs every 4 (four) hours as needed for wheezing (COPD).     lactulose (CHRONULAC) 10 GM/15ML solution Take 30 mLs (20 g total) by mouth 2 (two) times daily as needed for mild constipation, moderate constipation or severe constipation. 236 mL 0   lidocaine-prilocaine (EMLA) cream APPLY 1 APPLICATION TOPICALLY AS NEEDED. 30 g 2   LORazepam (ATIVAN) 0.5 MG tablet Take 1 tablet (0.5 mg total) by mouth every 8 (eight) hours as needed for anxiety (nausea). 30 tablet 0   megestrol (MEGACE) 20 MG tablet Take 1 tablet (20 mg total) by mouth 2 (two) times daily. 60 tablet 0   metoCLOPramide (REGLAN) 10 MG tablet Take 1 tablet (10 mg total) by mouth 3 (three) times daily before meals. 60 tablet 0   nicotine polacrilex (NICORETTE) 2 MG gum Take 1 each (2 mg total) by mouth as needed for smoking cessation. 100 tablet 0   nitroGLYCERIN (NITROSTAT) 0.4 MG SL tablet Place 1 tablet (0.4 mg  total) under the tongue every 5 (five) minutes as needed for chest pain. 25 tablet 5   nystatin (MYCOSTATIN) 100000 UNIT/ML suspension Take 5 mLs (500,000 Units total) by mouth 4 (four) times daily. 60 mL 0   ondansetron (ZOFRAN-ODT) 8 MG disintegrating tablet Take 1 tablet (8 mg total) by mouth every 8 (eight) hours as needed for nausea or vomiting. 30 tablet 2   oxyCODONE 10 MG TABS Take 1 tablet (10 mg total) by mouth every 4 (four) hours as needed for severe pain. 90 tablet 0   oxyCODONE ER (XTAMPZA ER) 13.5 MG C12A Take 13.5 mg by mouth every 12 (twelve) hours. 60 capsule 0  pantoprazole (PROTONIX) 40 MG tablet Take 1 tablet (40 mg total) by mouth 2 (two) times daily. 60 tablet 5   polyethylene glycol powder (GAVILAX) 17 GM/SCOOP powder TAKE ONE CAPFUL (17 GRAMS )BY MOUTH DAILY AS NEEDED FOR MILD OR MODERATE CONSTIPATION. 510 g 1   potassium chloride SA (KLOR-CON M) 20 MEQ tablet Take 1 tablet (20 mEq total) by mouth daily as needed. On days you take Lasix 30 tablet 1   prochlorperazine (COMPAZINE) 10 MG tablet TAKE 1 TABLET (10 MG TOTAL) BY MOUTH EVERY 6 (SIX) HOURS AS NEEDED FOR NAUSEA OR VOMITING. 30 tablet 0   valACYclovir (VALTREX) 1000 MG tablet Take for 3 days prn each outbreak. 30 tablet 11   vitamin B-12 (CYANOCOBALAMIN) 1000 MCG tablet Take 1,000 mcg by mouth daily.     No current facility-administered medications for this visit.   Facility-Administered Medications Ordered in Other Visits  Medication Dose Route Frequency Provider Last Rate Last Admin   pembrolizumab (KEYTRUDA) 200 mg in sodium chloride 0.9 % 50 mL chemo infusion  200 mg Intravenous Once Si Gaul, MD       PEMEtrexed (ALIMTA) 800 mg in sodium chloride 0.9 % 100 mL chemo infusion  400 mg/m2 (Treatment Plan Recorded) Intravenous Once Si Gaul, MD        VITAL SIGNS: LMP 06/12/2016 (Approximate)  There were no vitals filed for this visit.  Estimated body mass index is 27.58 kg/m as calculated from  the following:   Height as of 09/15/22: 5\' 7"  (1.702 m).   Weight as of 09/15/22: 176 lb 1.6 oz (79.9 kg).   PERFORMANCE STATUS (ECOG) : 1 - Symptomatic but completely ambulatory   IMPRESSION: .  I connected by phone with Cassidy [name]. No acute distress identified. Taking things one day at a time. Denies nausea, vomiting, or diarrhea. Ongoing fatigue. Resting when needed.   Neoplasm related pain Cassidy [name] reports that her pain is well controlled on current regimen. She is taking Xtampza 13 mg every 12 hours and Oxycodone 5-10 mg every 4 hours as needed for breakthrough pain. Does not require around the clock. Tolerating Cymbalta.   We will continue to closely monitor and support.   2.  Nausea Controlled with medication.   3. Constipation  Better controlled with regimen. Reports GI made some adjustments to regimen.   4. Goals of Care Cassidy [name] is emotional expressing understanding of cancer state. Shares emotionally her concerns of not "doing her best" right now. She knows that she has options to hold or discontinue treatments however expresses she is not ready to make this decision knowing her cancer would potentially progress. She is remaining hopeful for some improvement in her symptoms and quality of life. Emotional support provided.   We discussed the importance of continued conversation with family and their medical providers regarding overall plan of care and treatment options, ensuring decisions are within the context of the patients values and GOCs.  PLAN: Xtampza 13 mg every 12 hours. Oxycodone IR 10 mg every 4-6 hours as needed for breakthrough pain. Senna-S and Miralax on a daily basis for the management of constipation. Zofran, Compazine, and Ativan as needed for nausea Megace 20mg  BID  Ongoing emotional support and symptom management. Palliative will plan to see patient back in 3-4 weeks in collaboration with her oncology appointments.   Patient expressed understanding  and was in agreement with this plan. She also understands that She can call the clinic at any time with any questions, concerns, or  complaints.    Any controlled substances utilized were prescribed in the context of palliative care. PDMP has been reviewed.   Any controlled substances utilized were prescribed in the context of palliative care. PDMP has been reviewed.    Visit consisted of counseling and education dealing with the complex and emotionally intense issues of symptom management and palliative care in the setting of serious and potentially life-threatening illness.Greater than 50%  of this time was spent counseling and coordinating care related to the above assessment and plan.  Willette Alma, AGPCNP-BC  Palliative Medicine Team/[facility]  *Please note that this is a verbal dictation therefore any spelling or grammatical errors are due to the "[employer] One" system interpretation.

## 2022-09-15 NOTE — Patient Instructions (Signed)
[facility]  Discharge Instructions: Thank you for choosing [facility] to provide your oncology and hematology care.   If you have a lab appointment with the Cancer Center, please go directly to the Cancer Center and check in at the registration area.   Wear comfortable clothing and clothing appropriate for easy access to any Portacath or PICC line.   We strive to give you quality time with your provider. You may need to reschedule your appointment if you arrive late (15 or more minutes).  Arriving late affects you and other patients whose appointments are after yours.  Also, if you miss three or more appointments without notifying the office, you may be dismissed from the clinic at the provider's discretion.      For prescription refill requests, have your pharmacy contact our office and allow 72 hours for refills to be completed.    Today you received the following chemotherapy and/or immunotherapy agents :  Pembrolizumab & Pemetrexed.       To help prevent nausea and vomiting after your treatment, we encourage you to take your nausea medication as directed.  BELOW ARE SYMPTOMS THAT SHOULD BE REPORTED IMMEDIATELY: *FEVER GREATER THAN 100.4 F (38 C) OR HIGHER *CHILLS OR SWEATING *NAUSEA AND VOMITING THAT IS NOT CONTROLLED WITH YOUR NAUSEA MEDICATION *UNUSUAL SHORTNESS OF BREATH *UNUSUAL BRUISING OR BLEEDING *URINARY PROBLEMS (pain or burning when urinating, or frequent urination) *BOWEL PROBLEMS (unusual diarrhea, constipation, pain near the anus) TENDERNESS IN MOUTH AND THROAT WITH OR WITHOUT PRESENCE OF ULCERS (sore throat, sores in mouth, or a toothache) UNUSUAL RASH, SWELLING OR PAIN  UNUSUAL VAGINAL DISCHARGE OR ITCHING   Items with * indicate a potential emergency and should be followed up as soon as possible or go to the Emergency Department if any problems should occur.  Please show the CHEMOTHERAPY ALERT CARD or  IMMUNOTHERAPY ALERT CARD at check-in to the Emergency Department and triage nurse.  Should you have questions after your visit or need to cancel or reschedule your appointment, please contact [facility]  Dept: [phone]  and follow the prompts.  Office hours are 8:00 a.m. to 4:30 p.m. Monday - Friday. Please note that voicemails left after 4:00 p.m. may not be returned until the following business day.  We are closed weekends and major holidays. You have access to a nurse at all times for urgent questions. Please call the main number to the clinic Dept: [phone] and follow the prompts.   For any non-urgent questions, you may also contact your provider using MyChart. We now offer e-Visits for anyone 18 and older to request care online for non-urgent symptoms. For details visit [email or web address].   Also download the MyChart app! Go to the app store, search "MyChart", open the app, select [facility], and log in with your MyChart username and password.   

## 2022-09-15 NOTE — Progress Notes (Signed)
Patient seen by Dr. Clarnce Flock reviewed: Glucose =180. Per Dr. [name] ,it is ok to treat pt today with Carboplatin , Alimta , Keytruda and glucose of 180

## 2022-09-15 NOTE — Progress Notes (Signed)
[facility] Telephone:([phone]) [phone]   Fax:([phone]) [phone]  OFFICE PROGRESS NOTE  [name], MD [address] [place] [address]  DIAGNOSIS: stage IV (T2a, N2, M1c) non-small cell lung cancer, adenocarcinoma presented with right upper lobe lung mass in addition to right hilar and mediastinal lymphadenopathy and innumerable bilateral pulmonary nodules as well as liver and bone metastasis diagnosed in August 2023.   Detected Alteration(s) / Biomarker(s) Associated FDA-approved therapies Clinical Trial Availability % cfDNA or Amplification  KRAS G12C  approved by FDA Adagrasib, Sotorasib Yes 5.3%  IDH1 R132L  approved in other indication Ivosidenib, Olutasidenib Yes 1.9%  PD-L1 expression 89%  PRIOR THERAPY: None  CURRENT THERAPY: Systemic chemotherapy with carboplatin for AUC of 5, Alimta 500 Mg/M2 and Keytruda 200 Mg IV every 3 weeks.  First dose September 02, 2021.  Status post 18 cycles.  Starting from cycle #5 she is on maintenance treatment with Alimta and Keytruda every 3 weeks.  INTERVAL HISTORY: Cassidy [name] 57 y.o. female returns to the clinic today for follow-up visit.  The patient is feeling fine today with no concerning complaints except for the swelling of the lower extremities.  She is currently on Lasix with no improvement.  She had ultrasound Doppler of the lower extremity that showed no evidence for deep venous thrombosis.  She also complains about darkening of the skin especially in the feet which is likely secondary to her chemotherapy.  The patient denied having any current chest pain, shortness of breath, cough or hemoptysis.  She has no nausea, vomiting, diarrhea or constipation.  She has no headache or visual changes.  She is here today for evaluation before starting cycle #19 of her treatment.  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  s/p PCTA to dRCA and ostial RPAV-PLA vessel + PDA branch (05/2012)  b. [place] s/p DES to mLAD - Resolute DES 3.0 x 22 (3.67mm -->3.3 mm)   Bipolar depression (HCC)    Breast abscess    a. right side.    COPD (chronic obstructive pulmonary disease) (HCC)    Depression    Diabetic peripheral neuropathy (HCC)    Fibromyalgia    Genital warts    GERD (gastroesophageal reflux disease)    History of hiatal hernia    HLD (hyperlipidemia)    Hypertension    Lung cancer (HCC)    Pain in limb    a. LE VENOUS DUPLEX, 02/05/2009 - no evidence of deep vein thrombosis, Baker's cyst   Pneumonia    PTSD (post-traumatic stress disorder)    Sickle cell trait (HCC)    Tobacco abuse    Tooth caries    Type II diabetes mellitus (HCC)     ALLERGIES:  is allergic to amoxil [amoxicillin], chantix [varenicline], suboxone [buprenorphine hcl-naloxone hcl], and emend [fosaprepitant dimeglumine].  MEDICATIONS:  Current Outpatient Medications  Medication Sig Dispense Refill   aspirin EC 81 MG tablet Take 81 mg by mouth daily.     atorvastatin (LIPITOR) 40 MG tablet TAKE 1 TABLET (40 MG TOTAL) BY MOUTH DAILY. 90 tablet 3   busPIRone (BUSPAR) 15 MG tablet Take 0.5 tablets (7.5 mg total) by mouth 2 (two) times daily. 60 tablet 3   Carboxymethylcellul-Glycerin (LUBRICATING EYE DROPS  OP) Place 1 drop into both eyes daily as needed (dry eyes).     carvedilol (COREG) 12.5 MG tablet TAKE 1 TABLET (12.5 MG TOTAL) BY MOUTH 2 (TWO) TIMES DAILY WITH A MEAL. 180 tablet 1   diclofenac Sodium (VOLTAREN) 1 % GEL APPLY 1 APPLICATION TOPICALLY 3 TIMES DAILY AS NEEDED FOR PAIN 200 g 1   doxycycline (VIBRA-TABS) 100 MG tablet Take 1 tablet (100 mg total) by mouth 2 (two) times daily. 20 tablet 0   DULoxetine (CYMBALTA) 30 MG capsule Take 1 capsule (30 mg total) by mouth daily. 30 capsule 5   ferrous sulfate 325 (65 FE) MG  tablet Take 325 mg by mouth daily with breakfast.     fluticasone (FLONASE) 50 MCG/ACT nasal spray PLACE 1 SPRAY INTO BOTH NOSTRILS DAILY AS NEEDED (CONGESTION). 16 g 1   fluticasone-salmeterol (ADVAIR) 100-50 MCG/ACT AEPB Inhale 1 puff into the lungs 2 (two) times daily. 1 each 3   folic acid (FOLVITE) 1 MG tablet Take 1 tablet (1 mg total) by mouth daily. 30 tablet 3   furosemide (LASIX) 40 MG tablet Take 1 tablet (40 mg total) by mouth daily as needed. If you gain 3 lbs in 24 hours or 5 lbs in 1 week 30 tablet 1   glucose blood (ACCU-CHEK AVIVA PLUS) test strip Use as instructed 50 each 1   hyoscyamine (LEVSIN SL) 0.125 MG SL tablet Place 1 tablet (0.125 mg total) under the tongue every 6 (six) hours as needed. For abdominal cramping 30 tablet 0   insulin glargine (LANTUS) 100 UNIT/ML Solostar Pen Inject 24 Units into the skin every morning. 3 mL 11   Insulin Pen Needle 32G X 4 MM MISC 1 Needle by Does not apply route in the morning and at bedtime. 120 each 2   ipratropium (ATROVENT HFA) 17 MCG/ACT inhaler Inhale 2 puffs into the lungs every 4 (four) hours as needed for wheezing (COPD).     lactulose (CHRONULAC) 10 GM/15ML solution Take 30 mLs (20 g total) by mouth 2 (two) times daily as needed for mild constipation, moderate constipation or severe constipation. 236 mL 0   lidocaine-prilocaine (EMLA) cream APPLY 1 APPLICATION TOPICALLY AS NEEDED. 30 g 2   LORazepam (ATIVAN) 0.5 MG tablet Take 1 tablet (0.5 mg total) by mouth every 8 (eight) hours as needed for anxiety (nausea). 30 tablet 0   megestrol (MEGACE) 20 MG tablet Take 1 tablet (20 mg total) by mouth 2 (two) times daily. 60 tablet 0   metoCLOPramide (REGLAN) 10 MG tablet Take 1 tablet (10 mg total) by mouth 3 (three) times daily before meals. 60 tablet 0   nicotine polacrilex (NICORETTE) 2 MG gum Take 1 each (2 mg total) by mouth as needed for smoking cessation. 100 tablet 0   nitroGLYCERIN (NITROSTAT) 0.4 MG SL tablet Place 1 tablet (0.4  mg total) under the tongue every 5 (five) minutes as needed for chest pain. 25 tablet 5   nystatin (MYCOSTATIN) 100000 UNIT/ML suspension Take 5 mLs (500,000 Units total) by mouth 4 (four) times daily. 60 mL 0   ondansetron (ZOFRAN-ODT) 8 MG disintegrating tablet Take 1 tablet (8 mg total) by mouth every 8 (eight) hours as needed for nausea or vomiting. 30 tablet 2   oxyCODONE 10 MG TABS Take 1 tablet (10 mg total) by mouth every 4 (four) hours as needed for severe pain. 90 tablet 0   oxyCODONE ER (XTAMPZA ER) 13.5 MG C12A Take 13.5 mg by mouth every 12 (  twelve) hours. 60 capsule 0   pantoprazole (PROTONIX) 40 MG tablet Take 1 tablet (40 mg total) by mouth 2 (two) times daily. 60 tablet 5   polyethylene glycol powder (GAVILAX) 17 GM/SCOOP powder TAKE ONE CAPFUL (17 GRAMS )BY MOUTH DAILY AS NEEDED FOR MILD OR MODERATE CONSTIPATION. 510 g 1   potassium chloride SA (KLOR-CON M) 20 MEQ tablet Take 1 tablet (20 mEq total) by mouth daily as needed. On days you take Lasix 30 tablet 1   prochlorperazine (COMPAZINE) 10 MG tablet TAKE 1 TABLET (10 MG TOTAL) BY MOUTH EVERY 6 (SIX) HOURS AS NEEDED FOR NAUSEA OR VOMITING. 30 tablet 0   valACYclovir (VALTREX) 1000 MG tablet Take for 3 days prn each outbreak. 30 tablet 11   vitamin B-12 (CYANOCOBALAMIN) 1000 MCG tablet Take 1,000 mcg by mouth daily.     No current facility-administered medications for this visit.    SURGICAL HISTORY:  Past Surgical History:  Procedure Laterality Date   BLADDER SURGERY  1980   "TVT"   BLADDER SURGERY  2003   "TVT"   BREAST EXCISIONAL BIOPSY     BREAST SURGERY Right    I&D for multiple abscesses   CARDIAC CATHETERIZATION     CORONARY ARTERY BYPASS GRAFT N/A 06/30/2021   Procedure: CORONARY ARTERY BYPASS GRAFTING (CABG) x3 USING LEFT INTERNAL MAMMARY ARTERY,  RIGHT RADIAL ARTERY AND LEFT GREATER SAPHENOUS VEIN;  Surgeon: Corliss Skains, MD;  Location: [facility] OR;  Service: Open Heart Surgery;  Laterality: N/A;   cutting  balloon     ENDOVEIN HARVEST OF GREATER SAPHENOUS VEIN Left 06/30/2021   Procedure: ENDOVEIN HARVEST OF GREATER SAPHENOUS VEIN;  Surgeon: Corliss Skains, MD;  Location: [facility] OR;  Service: Open Heart Surgery;  Laterality: Left;   EYE SURGERY Left    laser surgery   FINE NEEDLE ASPIRATION  08/21/2021   Procedure: FINE NEEDLE ASPIRATION (FNA) LINEAR;  Surgeon: Josephine Igo, DO;  Location: [facility] ENDOS[facility];  Service: Pulmonary;;   INCISE AND DRAIN ABCESS  ; 04/26/11; 08/13/11   right breast   INCISION AND DRAINAGE ABSCESS Right 05/09/2012   Procedure: INCISION AND DRAINAGE RIGHT BREAST ABSCESS;  Surgeon: Wilmon Arms. Corliss Skains, MD;  Location: [facility] OR;  Service: General;  Laterality: Right;   INCISION AND DRAINAGE ABSCESS Right 02/08/2014   Procedure: INCISION AND DRAINAGE RIGHT BREAST ABSCESS;  Surgeon: Manus Rudd, MD;  Location: [facility] OR;  Service: General;  Laterality: Right;   INCISION AND DRAINAGE ABSCESS Right 06/14/2014   Procedure: INCISION AND DRAINAGE RIGHT BREAST ABSCESS;  Surgeon: Manus Rudd, MD;  Location: [facility] OR;  Service: General;  Laterality: Right;   IR IMAGING GUIDED PORT INSERTION  12/08/2021   IRRIGATION AND DEBRIDEMENT ABSCESS  12/19/2010   Procedure: IRRIGATION AND DEBRIDEMENT ABSCESS;  Surgeon: Emelia Loron, MD;  Location: [facility] OR;  Service: General;  Laterality: Right;   IRRIGATION AND DEBRIDEMENT ABSCESS  08/13/2011   Procedure: MINOR INCISION AND DRAINAGE OF ABSCESS;  Surgeon: Cherylynn Ridges, MD;  Location: [facility];  Service: General;  Laterality: Right;  Right Breast    IRRIGATION AND DEBRIDEMENT ABSCESS  11/17/2011   Procedure: IRRIGATION AND DEBRIDEMENT ABSCESS;  Surgeon: Wilmon Arms. Corliss Skains, MD;  Location: [facility];  Service: General;  Laterality: Right;  irrigation and debridement right recurrent breast abscess   LARYNX SURGERY     LEFT HEART CATH AND CORONARY ANGIOGRAPHY N/A 04/09/2021   Procedure: LEFT HEART CATH AND CORONARY ANGIOGRAPHY;  Surgeon: Corky Crafts, MD;   Location:  [facility];  Service: Cardiovascular;  Laterality: N/A;   LEFT HEART CATHETERIZATION WITH CORONARY ANGIOGRAM N/A 05/29/2012   Procedure: LEFT HEART CATHETERIZATION WITH CORONARY ANGIOGRAM & PTCA;  Surgeon: Lennette Bihari, MD;  Location: [facility] CATH LAB;  Service: Cardiovascular; Bifurcation dRCA-RPAD/PLA & rPDA  PTCA.   LEFT HEART CATHETERIZATION WITH CORONARY ANGIOGRAM N/A 11/08/2013   Procedure: LEFT HEART CATHETERIZATION WITH CORONARY ANGIOGRAM and Coronary Stent Intervention;  Surgeon: Marykay Lex, MD;  Location: [facility] CATH LAB;  Service: Cardiovascular; mLAD 80% (FFR 0.73) - resolute DES 3.0 x 22 mm (postdilated to 3.3 mm->3.5 mm)   NM MYOVIEW LTD  01/26/2009   Normal study, no evidence of ischemia, EF 67%   ployp removed from voice box 03/30/12     RADIAL ARTERY HARVEST Right 06/30/2021   Procedure: RADIAL ARTERY HARVEST;  Surgeon: Corliss Skains, MD;  Location: [facility];  Service: Open Heart Surgery;  Laterality: Right;   REFRACTIVE SURGERY  ~ 2010   right   TEE WITHOUT CARDIOVERSION N/A 06/30/2021   Procedure: TRANSESOPHAGEAL ECHOCARDIOGRAM (TEE);  Surgeon: Corliss Skains, MD;  Location: [facility] OR;  Service: Open Heart Surgery;  Laterality: N/A;   TONSILLECTOMY  1988   VIDEO BRONCHOSCOPY WITH ENDOBRONCHIAL ULTRASOUND Bilateral 08/21/2021   Procedure: VIDEO BRONCHOSCOPY WITH ENDOBRONCHIAL ULTRASOUND;  Surgeon: Josephine Igo, DO;  Location: [facility] ENDOS[facility];  Service: Pulmonary;  Laterality: Bilateral;    REVIEW OF SYSTEMS:  A comprehensive review of systems was negative except for: Constitutional: positive for fatigue Integument/breast: positive for skin color change   PHYSICAL EXAMINATION: General appearance: alert, cooperative, fatigued, and no distress Head: Normocephalic, without obvious abnormality, atraumatic Neck: no adenopathy, no JVD, supple, symmetrical, trachea midline, and thyroid not enlarged, symmetric, no tenderness/mass/nodules Lymph nodes: Cervical,  supraclavicular, and axillary nodes normal. Resp: clear to auscultation bilaterally Back: symmetric, no curvature. ROM normal. No CVA tenderness. Cardio: regular rate and rhythm, S1, S2 normal, no murmur, click, rub or gallop GI: soft, non-tender; bowel sounds normal; no masses,  no organomegaly Extremities: edema trace edema in the ankles  ECOG PERFORMANCE STATUS: 1 - Symptomatic but completely ambulatory  Blood pressure 125/81, pulse 75, temperature (!) 97.4 F (36.3 C), temperature source Oral, resp. rate 17, height 5\' 7"  (1.702 m), weight 176 lb 1.6 oz (79.9 kg), last menstrual period 06/12/2016, SpO2 100%.  LABORATORY DATA: Lab Results  Component Value Date   WBC 5.7 09/15/2022   HGB 9.4 (L) 09/15/2022   HCT 27.2 (L) 09/15/2022   MCV 106.7 (H) 09/15/2022   PLT 317 09/15/2022      Chemistry      Component Value Date/Time   NA 138 08/25/2022 1416   NA 140 04/03/2021 1059   K 3.9 08/25/2022 1416   CL 105 08/25/2022 1416   CO2 28 08/25/2022 1416   BUN 8 08/25/2022 1416   BUN 13 04/03/2021 1059   CREATININE 0.96 08/25/2022 1416   CREATININE 0.65 11/06/2013 1520      Component Value Date/Time   CALCIUM 8.6 (L) 08/25/2022 1416   ALKPHOS 70 08/25/2022 1416   AST 24 08/25/2022 1416   ALT 16 08/25/2022 1416   BILITOT 0.6 08/25/2022 1416       RADIOGRAPHIC STUDIES: VAS [place] LOWER EXTREMITY VENOUS (DVT)  Result Date: 08/27/2022  Lower Venous DVT Study Patient Name:  Cassidy [name]  Date of Exam:   08/27/2022 Medical Rec #: [id number]        Accession #:    [id number] Date  of Birth: [birth year]       Patient Gender: F Patient Age:   62 years Exam Location:  [facility] Procedure:      VAS [place] LOWER EXTREMITY VENOUS (DVT) Referring Phys: Elonda Husky HEILINGOETTER --------------------------------------------------------------------------------  Indications: Pain, and Swelling.  Risk Factors: Cancer -IV non-small cell lung cancer. Comparison Study: No previous study. Performing  Technologist: McKayla Maag RVT, [place]  Examination Guidelines: A complete evaluation includes B-mode imaging, spectral Doppler, color Doppler, and power Doppler as needed of all accessible portions of each vessel. Bilateral testing is considered an integral part of a complete examination. Limited examinations for reoccurring indications may be performed as noted. The reflux portion of the exam is performed with the patient in reverse Trendelenburg.  +---------+---------------+---------+-----------+----------+--------------+ RIGHT    CompressibilityPhasicitySpontaneityPropertiesThrombus Aging +---------+---------------+---------+-----------+----------+--------------+ CFV      Full           Yes      Yes                                 +---------+---------------+---------+-----------+----------+--------------+ SFJ      Full                                                        +---------+---------------+---------+-----------+----------+--------------+ FV Prox  Full                                                        +---------+---------------+---------+-----------+----------+--------------+ FV Mid   Full                                                        +---------+---------------+---------+-----------+----------+--------------+ FV DistalFull                                                        +---------+---------------+---------+-----------+----------+--------------+ PFV      Full                                                        +---------+---------------+---------+-----------+----------+--------------+ POP      Full           Yes      Yes                                 +---------+---------------+---------+-----------+----------+--------------+ PTV      Full                                                        +---------+---------------+---------+-----------+----------+--------------+  PERO     Full                                                         +---------+---------------+---------+-----------+----------+--------------+   +----+---------------+---------+-----------+----------+--------------+ LEFTCompressibilityPhasicitySpontaneityPropertiesThrombus Aging +----+---------------+---------+-----------+----------+--------------+ CFV Full           Yes      Yes                                 +----+---------------+---------+-----------+----------+--------------+ SFJ Full                                                        +----+---------------+---------+-----------+----------+--------------+     Summary: RIGHT: - There is no evidence of deep vein thrombosis in the lower extremity.  - No cystic structure found in the popliteal fossa.  LEFT: - No evidence of common femoral vein obstruction.  *See table(s) above for measurements and observations. Electronically signed by Lemar Livings MD on 08/27/2022 at 7:14:02 PM.    Final     ASSESSMENT AND PLAN: This is a very pleasant 57 years old African-American female recently diagnosed with a stage IV (T2 a, N2, M1 C) non-small cell lung cancer, adenocarcinoma presented with right upper lobe lung mass in addition to right hilar and mediastinal lymphadenopathy as well as innumerable bilateral pulmonary nodules and bone and liver metastasis diagnosed in August 2023 with positive KRAS G12C mutation and PD-L1 expression of 89%. She is currently undergoing systemic chemotherapy with carboplatin for AUC of 5, Alimta 500 Mg/M2 and Keytruda 200 Mg IV every 3 weeks status post 18 cycles.  The patient has been tolerating this treatment well with no concerning adverse effects. I recommended for her to proceed with cycle #19 today as planned. I will see her back for follow-up visit in 3 weeks for evaluation before starting cycle #20.  I will consider repeating her imaging studies after the next cycle of her treatment. For the swelling of the lower extremities, she will continue her  current treatment with Lasix for now.  She was also advised to use compression stocking. For the lacrimation of the eye, she was advised to use over-the-counter saline eyedrops. For the dyspepsia and epigastric pain, she will continue her current treatment with Protonix. For the pain management she is followed by the palliative care team and currently on Xtampza ER 13.5 mg every 12 hours in addition to Oxy IR for breakthrough pain. The patient was advised to call immediately if she has any other concerning symptoms in the interval. The patient voices understanding of current disease status and treatment options and is in agreement with the current care plan.  All questions were answered. The patient knows to call the clinic with any problems, questions or concerns. We can certainly see the patient much sooner if necessary. The total time spent in the appointment was 20 minutes.  Disclaimer: This note was dictated with voice recognition software. Similar sounding words can inadvertently be transcribed and may not be corrected upon review.

## 2022-09-21 ENCOUNTER — Encounter: Payer: Self-pay | Admitting: Nurse Practitioner

## 2022-09-21 ENCOUNTER — Inpatient Hospital Stay (HOSPITAL_BASED_OUTPATIENT_CLINIC_OR_DEPARTMENT_OTHER): Payer: Medicaid Other | Admitting: Nurse Practitioner

## 2022-09-21 DIAGNOSIS — C3491 Malignant neoplasm of unspecified part of right bronchus or lung: Secondary | ICD-10-CM

## 2022-09-21 DIAGNOSIS — Z515 Encounter for palliative care: Secondary | ICD-10-CM | POA: Diagnosis not present

## 2022-09-21 DIAGNOSIS — R530 Neoplastic (malignant) related fatigue: Secondary | ICD-10-CM

## 2022-09-21 DIAGNOSIS — G893 Neoplasm related pain (acute) (chronic): Secondary | ICD-10-CM | POA: Diagnosis not present

## 2022-09-22 ENCOUNTER — Ambulatory Visit: Payer: Medicaid Other | Admitting: Gastroenterology

## 2022-09-28 ENCOUNTER — Other Ambulatory Visit: Payer: Self-pay

## 2022-09-28 ENCOUNTER — Encounter (HOSPITAL_COMMUNITY): Payer: Self-pay

## 2022-09-28 ENCOUNTER — Inpatient Hospital Stay (HOSPITAL_COMMUNITY)
Admission: EM | Admit: 2022-09-28 | Discharge: 2022-10-01 | DRG: 313 | Disposition: A | Discharge status: Home Health | Payer: Medicaid Other | Attending: Internal Medicine | Admitting: Internal Medicine

## 2022-09-28 ENCOUNTER — Emergency Department (HOSPITAL_COMMUNITY): Payer: Medicaid Other

## 2022-09-28 DIAGNOSIS — R000 Tachycardia, unspecified: Secondary | ICD-10-CM | POA: Diagnosis not present

## 2022-09-28 DIAGNOSIS — R079 Chest pain, unspecified: Secondary | ICD-10-CM | POA: Diagnosis not present

## 2022-09-28 DIAGNOSIS — Z833 Family history of diabetes mellitus: Secondary | ICD-10-CM

## 2022-09-28 DIAGNOSIS — Z85118 Personal history of other malignant neoplasm of bronchus and lung: Secondary | ICD-10-CM | POA: Diagnosis not present

## 2022-09-28 DIAGNOSIS — Z794 Long term (current) use of insulin: Secondary | ICD-10-CM

## 2022-09-28 DIAGNOSIS — Z825 Family history of asthma and other chronic lower respiratory diseases: Secondary | ICD-10-CM

## 2022-09-28 DIAGNOSIS — I10 Essential (primary) hypertension: Secondary | ICD-10-CM | POA: Diagnosis present

## 2022-09-28 DIAGNOSIS — R1084 Generalized abdominal pain: Secondary | ICD-10-CM | POA: Diagnosis present

## 2022-09-28 DIAGNOSIS — E663 Overweight: Secondary | ICD-10-CM | POA: Diagnosis present

## 2022-09-28 DIAGNOSIS — I259 Chronic ischemic heart disease, unspecified: Secondary | ICD-10-CM | POA: Diagnosis not present

## 2022-09-28 DIAGNOSIS — Z800 Family history of malignant neoplasm of digestive organs: Secondary | ICD-10-CM

## 2022-09-28 DIAGNOSIS — Z8249 Family history of ischemic heart disease and other diseases of the circulatory system: Secondary | ICD-10-CM

## 2022-09-28 DIAGNOSIS — F17210 Nicotine dependence, cigarettes, uncomplicated: Secondary | ICD-10-CM | POA: Diagnosis present

## 2022-09-28 DIAGNOSIS — C3491 Malignant neoplasm of unspecified part of right bronchus or lung: Secondary | ICD-10-CM | POA: Diagnosis present

## 2022-09-28 DIAGNOSIS — F4310 Post-traumatic stress disorder, unspecified: Secondary | ICD-10-CM | POA: Diagnosis present

## 2022-09-28 DIAGNOSIS — E876 Hypokalemia: Secondary | ICD-10-CM

## 2022-09-28 DIAGNOSIS — Z807 Family history of other malignant neoplasms of lymphoid, hematopoietic and related tissues: Secondary | ICD-10-CM

## 2022-09-28 DIAGNOSIS — Z79899 Other long term (current) drug therapy: Secondary | ICD-10-CM

## 2022-09-28 DIAGNOSIS — I700 Atherosclerosis of aorta: Secondary | ICD-10-CM | POA: Diagnosis present

## 2022-09-28 DIAGNOSIS — K122 Cellulitis and abscess of mouth: Secondary | ICD-10-CM | POA: Diagnosis present

## 2022-09-28 DIAGNOSIS — Z880 Allergy status to penicillin: Secondary | ICD-10-CM

## 2022-09-28 DIAGNOSIS — R0789 Other chest pain: Secondary | ICD-10-CM | POA: Diagnosis not present

## 2022-09-28 DIAGNOSIS — R531 Weakness: Secondary | ICD-10-CM | POA: Diagnosis present

## 2022-09-28 DIAGNOSIS — Z8589 Personal history of malignant neoplasm of other organs and systems: Secondary | ICD-10-CM

## 2022-09-28 DIAGNOSIS — R188 Other ascites: Secondary | ICD-10-CM | POA: Diagnosis present

## 2022-09-28 DIAGNOSIS — Z7951 Long term (current) use of inhaled steroids: Secondary | ICD-10-CM

## 2022-09-28 DIAGNOSIS — D573 Sickle-cell trait: Secondary | ICD-10-CM | POA: Diagnosis present

## 2022-09-28 DIAGNOSIS — I214 Non-ST elevation (NSTEMI) myocardial infarction: Secondary | ICD-10-CM

## 2022-09-28 DIAGNOSIS — R609 Edema, unspecified: Secondary | ICD-10-CM | POA: Diagnosis not present

## 2022-09-28 DIAGNOSIS — J449 Chronic obstructive pulmonary disease, unspecified: Secondary | ICD-10-CM | POA: Diagnosis present

## 2022-09-28 DIAGNOSIS — Z7982 Long term (current) use of aspirin: Secondary | ICD-10-CM

## 2022-09-28 DIAGNOSIS — D3501 Benign neoplasm of right adrenal gland: Secondary | ICD-10-CM | POA: Diagnosis present

## 2022-09-28 DIAGNOSIS — R002 Palpitations: Secondary | ICD-10-CM | POA: Diagnosis not present

## 2022-09-28 DIAGNOSIS — E1142 Type 2 diabetes mellitus with diabetic polyneuropathy: Secondary | ICD-10-CM | POA: Diagnosis present

## 2022-09-28 DIAGNOSIS — Z6826 Body mass index (BMI) 26.0-26.9, adult: Secondary | ICD-10-CM

## 2022-09-28 DIAGNOSIS — J90 Pleural effusion, not elsewhere classified: Secondary | ICD-10-CM | POA: Diagnosis not present

## 2022-09-28 DIAGNOSIS — E119 Type 2 diabetes mellitus without complications: Secondary | ICD-10-CM

## 2022-09-28 DIAGNOSIS — R112 Nausea with vomiting, unspecified: Secondary | ICD-10-CM | POA: Diagnosis not present

## 2022-09-28 DIAGNOSIS — K5900 Constipation, unspecified: Secondary | ICD-10-CM | POA: Diagnosis present

## 2022-09-28 DIAGNOSIS — I2489 Other forms of acute ischemic heart disease: Secondary | ICD-10-CM

## 2022-09-28 DIAGNOSIS — D539 Nutritional anemia, unspecified: Secondary | ICD-10-CM | POA: Diagnosis present

## 2022-09-28 DIAGNOSIS — M797 Fibromyalgia: Secondary | ICD-10-CM | POA: Diagnosis present

## 2022-09-28 DIAGNOSIS — Z951 Presence of aortocoronary bypass graft: Secondary | ICD-10-CM

## 2022-09-28 DIAGNOSIS — E785 Hyperlipidemia, unspecified: Secondary | ICD-10-CM | POA: Diagnosis present

## 2022-09-28 DIAGNOSIS — R109 Unspecified abdominal pain: Secondary | ICD-10-CM | POA: Diagnosis present

## 2022-09-28 DIAGNOSIS — E8809 Other disorders of plasma-protein metabolism, not elsewhere classified: Secondary | ICD-10-CM | POA: Diagnosis present

## 2022-09-28 DIAGNOSIS — Z888 Allergy status to other drugs, medicaments and biological substances status: Secondary | ICD-10-CM

## 2022-09-28 DIAGNOSIS — I340 Nonrheumatic mitral (valve) insufficiency: Secondary | ICD-10-CM | POA: Diagnosis present

## 2022-09-28 DIAGNOSIS — I252 Old myocardial infarction: Secondary | ICD-10-CM

## 2022-09-28 DIAGNOSIS — F319 Bipolar disorder, unspecified: Secondary | ICD-10-CM | POA: Diagnosis present

## 2022-09-28 DIAGNOSIS — I2510 Atherosclerotic heart disease of native coronary artery without angina pectoris: Secondary | ICD-10-CM | POA: Diagnosis present

## 2022-09-28 DIAGNOSIS — Z83438 Family history of other disorder of lipoprotein metabolism and other lipidemia: Secondary | ICD-10-CM

## 2022-09-28 DIAGNOSIS — Z23 Encounter for immunization: Secondary | ICD-10-CM

## 2022-09-28 DIAGNOSIS — Z1152 Encounter for screening for COVID-19: Secondary | ICD-10-CM

## 2022-09-28 DIAGNOSIS — D630 Anemia in neoplastic disease: Secondary | ICD-10-CM | POA: Diagnosis present

## 2022-09-28 DIAGNOSIS — Z955 Presence of coronary angioplasty implant and graft: Secondary | ICD-10-CM

## 2022-09-28 DIAGNOSIS — K219 Gastro-esophageal reflux disease without esophagitis: Secondary | ICD-10-CM | POA: Diagnosis present

## 2022-09-28 DIAGNOSIS — C3490 Malignant neoplasm of unspecified part of unspecified bronchus or lung: Secondary | ICD-10-CM | POA: Diagnosis not present

## 2022-09-28 DIAGNOSIS — R911 Solitary pulmonary nodule: Secondary | ICD-10-CM | POA: Diagnosis not present

## 2022-09-28 LAB — CBC
HCT: 28 % — ABNORMAL LOW (ref 36.0–46.0)
Hemoglobin: 9.5 g/dL — ABNORMAL LOW (ref 12.0–15.0)
MCH: 35.6 pg — ABNORMAL HIGH (ref 26.0–34.0)
MCHC: 33.9 g/dL (ref 30.0–36.0)
MCV: 104.9 fL — ABNORMAL HIGH (ref 80.0–100.0)
Platelets: 161 10*3/uL (ref 150–400)
RBC: 2.67 MIL/uL — ABNORMAL LOW (ref 3.87–5.11)
RDW: 14.1 % (ref 11.5–15.5)
WBC: 5.9 10*3/uL (ref 4.0–10.5)
nRBC: 0 % (ref 0.0–0.2)

## 2022-09-28 LAB — BASIC METABOLIC PANEL
Anion gap: 10 (ref 5–15)
BUN: 12 mg/dL (ref 6–20)
CO2: 25 mmol/L (ref 22–32)
Calcium: 8.8 mg/dL — ABNORMAL LOW (ref 8.9–10.3)
Chloride: 100 mmol/L (ref 98–111)
Creatinine, Ser: 0.89 mg/dL (ref 0.44–1.00)
GFR, Estimated: >60 mL/min (ref >60)
Glucose, Bld: 181 mg/dL — ABNORMAL HIGH (ref 70–99)
Potassium: 3.2 mmol/L — ABNORMAL LOW (ref 3.5–5.1)
Sodium: 135 mmol/L (ref 135–145)

## 2022-09-28 LAB — MAGNESIUM: Magnesium: 1.6 mg/dL — ABNORMAL LOW (ref 1.7–2.4)

## 2022-09-28 LAB — TROPONIN I (HIGH SENSITIVITY)
Troponin I (High Sensitivity): 118 ng/L (ref <18)
Troponin I (High Sensitivity): 183 ng/L (ref <18)

## 2022-09-28 LAB — D-DIMER, QUANTITATIVE: D-Dimer, Quant: 3.81 ug{FEU}/mL — ABNORMAL HIGH (ref 0.00–0.50)

## 2022-09-28 MED ORDER — ASPIRIN 81 MG PO CHEW: 81.0000 mg | CHEWABLE_TABLET | Freq: Once | ORAL | Status: AC

## 2022-09-28 MED ORDER — ONDANSETRON 4 MG PO TBDP: 4.0000 mg | ORAL_TABLET | Freq: Once | ORAL | Status: AC

## 2022-09-28 MED ORDER — IOHEXOL 350 MG/ML SOLN
80.0000 mL | Freq: Once | INTRAVENOUS | Status: AC | PRN
  Administered 2022-09-28: 80 mL via INTRAVENOUS

## 2022-09-28 MED ORDER — ASPIRIN 81 MG PO CHEW
CHEWABLE_TABLET | ORAL | Status: AC
  Administered 2022-09-28: 81 mg via ORAL
  Filled 2022-09-28: qty 1

## 2022-09-28 MED ORDER — ONDANSETRON 4 MG PO TBDP
ORAL_TABLET | ORAL | Status: AC
  Administered 2022-09-28: 4 mg via ORAL
  Filled 2022-09-28: qty 1

## 2022-09-28 MED ORDER — POTASSIUM CHLORIDE CRYS ER 20 MEQ PO TBCR
40.0000 meq | EXTENDED_RELEASE_TABLET | Freq: Once | ORAL | Status: AC
  Administered 2022-09-29: 40 meq via ORAL
  Filled 2022-09-28: qty 2

## 2022-09-28 MED ORDER — OXYCODONE-ACETAMINOPHEN 5-325 MG PO TABS
1.0000 | ORAL_TABLET | Freq: Once | ORAL | Status: AC
  Administered 2022-09-28: 1 via ORAL
  Filled 2022-09-28: qty 1

## 2022-09-28 NOTE — ED Provider Notes (Incomplete)
[place] EMERGENCY DEPARTMENT AT [facility] Provider Note   CSN: [id number] Arrival date & time: 09/28/22  1743     History {Add pertinent medical, surgical, social history, OB history to HPI:1} Chief Complaint  Patient presents with  . Chest Pain    Cassidy [name] is a 57 y.o. female with a past history significant for triple bypass surgery, lung cancer, diabetes, hypertension presents today for evaluation of chest pain.  Symptom has been going on for about a week.  She also report palpitation and shortness of breath on exertion.  States that the last 3 days she has had intermittent nausea and vomiting. Denies abdominal pain.   Chest Pain   Past Medical History:  Diagnosis Date  . Adenocarcinoma of right lung, stage 4 (HCC) 08/26/2021  . Anemia    has sickle cell trait  . 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  s/p PCTA to dRCA and ostial RPAV-PLA vessel + PDA branch (05/2012)  b. [place] s/p DES to mLAD - Resolute DES 3.0 x 22 (3.52mm -->3.3 mm)  . Bipolar depression (HCC)   . Breast abscess    a. right side.   Marland Kitchen COPD (chronic obstructive pulmonary disease) (HCC)   . Depression   . Diabetic peripheral neuropathy (HCC)   . Fibromyalgia   . Genital warts   . GERD (gastroesophageal reflux disease)   . History of hiatal hernia   . HLD (hyperlipidemia)   . Hypertension   . Lung cancer (HCC)   . Pain in limb    a. Cameryn Chrisley VENOUS DUPLEX, 02/05/2009 - no evidence of deep vein thrombosis, Baker's cyst  . Pneumonia   . PTSD (post-traumatic stress disorder)   . Sickle cell trait (HCC)   . Tobacco abuse   . Tooth caries   . Type II diabetes mellitus (HCC)    Past Surgical History:  Procedure Laterality Date  . BLADDER SURGERY  1980   "TVT"  . BLADDER SURGERY  2003   "TVT"  . BREAST EXCISIONAL BIOPSY    . BREAST SURGERY Right    I&D for multiple abscesses  . CARDIAC CATHETERIZATION    . CORONARY  ARTERY BYPASS GRAFT N/A 06/30/2021   Procedure: CORONARY ARTERY BYPASS GRAFTING (CABG) x3 USING LEFT INTERNAL MAMMARY ARTERY,  RIGHT RADIAL ARTERY AND LEFT GREATER SAPHENOUS VEIN;  Surgeon: Corliss Skains, MD;  Location: [facility] OR;  Service: Open Heart Surgery;  Laterality: N/A;  . cutting balloon    . ENDOVEIN HARVEST OF GREATER SAPHENOUS VEIN Left 06/30/2021   Procedure: ENDOVEIN HARVEST OF GREATER SAPHENOUS VEIN;  Surgeon: Corliss Skains, MD;  Location: [facility] OR;  Service: Open Heart Surgery;  Laterality: Left;  . EYE SURGERY Left    laser surgery  . FINE NEEDLE ASPIRATION  08/21/2021   Procedure: FINE NEEDLE ASPIRATION (FNA) LINEAR;  Surgeon: Josephine Igo, DO;  Location: [facility] ENDOSCOPY;  Service: Pulmonary;;  . INCISE AND DRAIN ABCESS  ; 04/26/11; 08/13/11   right breast  . INCISION AND DRAINAGE ABSCESS Right 05/09/2012   Procedure: INCISION AND DRAINAGE RIGHT BREAST ABSCESS;  Surgeon: Wilmon Arms. Corliss Skains, MD;  Location: [facility] OR;  Service: General;  Laterality: Right;  . INCISION AND DRAINAGE ABSCESS Right 02/08/2014   Procedure: INCISION AND DRAINAGE RIGHT BREAST ABSCESS;  Surgeon: Manus Rudd, MD;  Location: [facility] OR;  Service: General;  Laterality: Right;  . INCISION AND DRAINAGE ABSCESS Right 06/14/2014   Procedure:  INCISION AND DRAINAGE RIGHT BREAST ABSCESS;  Surgeon: Manus Rudd, MD;  Location: [facility] OR;  Service: General;  Laterality: Right;  . IR IMAGING GUIDED PORT INSERTION  12/08/2021  . IRRIGATION AND DEBRIDEMENT ABSCESS  12/19/2010   Procedure: IRRIGATION AND DEBRIDEMENT ABSCESS;  Surgeon: Emelia Loron, MD;  Location: [facility] OR;  Service: General;  Laterality: Right;  . IRRIGATION AND DEBRIDEMENT ABSCESS  08/13/2011   Procedure: MINOR INCISION AND DRAINAGE OF ABSCESS;  Surgeon: Cherylynn Ridges, MD;  Location: [facility] OR;  Service: General;  Laterality: Right;  Right Breast   . IRRIGATION AND DEBRIDEMENT ABSCESS  11/17/2011   Procedure: IRRIGATION AND DEBRIDEMENT ABSCESS;  Surgeon:  Wilmon Arms. Corliss Skains, MD;  Location: [facility] OR;  Service: General;  Laterality: Right;  irrigation and debridement right recurrent breast abscess  . LARYNX SURGERY    . LEFT HEART CATH AND CORONARY ANGIOGRAPHY N/A 04/09/2021   Procedure: LEFT HEART CATH AND CORONARY ANGIOGRAPHY;  Surgeon: Corky Crafts, MD;  Location: [facility] INVASIVE CV LAB;  Service: Cardiovascular;  Laterality: N/A;  . LEFT HEART CATHETERIZATION WITH CORONARY ANGIOGRAM N/A 05/29/2012   Procedure: LEFT HEART CATHETERIZATION WITH CORONARY ANGIOGRAM & PTCA;  Surgeon: Lennette Bihari, MD;  Location: [facility] CATH LAB;  Service: Cardiovascular; Bifurcation dRCA-RPAD/PLA & rPDA  PTCA.  Marland Kitchen LEFT HEART CATHETERIZATION WITH CORONARY ANGIOGRAM N/A 11/08/2013   Procedure: LEFT HEART CATHETERIZATION WITH CORONARY ANGIOGRAM and Coronary Stent Intervention;  Surgeon: Marykay Lex, MD;  Location: [facility] CATH LAB;  Service: Cardiovascular; mLAD 80% (FFR 0.73) - resolute DES 3.0 x 22 mm (postdilated to 3.3 mm->3.5 mm)  . NM MYOVIEW LTD  01/26/2009   Normal study, no evidence of ischemia, EF 67%  . ployp removed from voice box 03/30/12    . RADIAL ARTERY HARVEST Right 06/30/2021   Procedure: RADIAL ARTERY HARVEST;  Surgeon: Corliss Skains, MD;  Location: [facility] OR;  Service: Open Heart Surgery;  Laterality: Right;  . REFRACTIVE SURGERY  ~ 2010   right  . TEE WITHOUT CARDIOVERSION N/A 06/30/2021   Procedure: TRANSESOPHAGEAL ECHOCARDIOGRAM (TEE);  Surgeon: Corliss Skains, MD;  Location: [facility] OR;  Service: Open Heart Surgery;  Laterality: N/A;  . TONSILLECTOMY  1988  . VIDEO BRONCHOSCOPY WITH ENDOBRONCHIAL ULTRASOUND Bilateral 08/21/2021   Procedure: VIDEO BRONCHOSCOPY WITH ENDOBRONCHIAL ULTRASOUND;  Surgeon: Josephine Igo, DO;  Location: [facility] ENDOSCOPY;  Service: Pulmonary;  Laterality: Bilateral;     Home Medications Prior to Admission medications   Medication Sig Start Date End Date Taking? Authorizing Provider  aspirin EC 81 MG tablet Take 81 mg by  mouth daily.    [provider]  atorvastatin (LIPITOR) 40 MG tablet TAKE 1 TABLET (40 MG TOTAL) BY MOUTH DAILY. 04/23/22   [name], DO  busPIRone (BUSPAR) 15 MG tablet Take 0.5 tablets (7.5 mg total) by mouth 2 (two) times daily. 05/20/22   [name], DO  Carboxymethylcellul-Glycerin (LUBRICATING EYE DROPS OP) Place 1 drop into both eyes daily as needed (dry eyes).    [provider]  carvedilol (COREG) 12.5 MG tablet TAKE 1 TABLET (12.5 MG TOTAL) BY MOUTH 2 (TWO) TIMES DAILY WITH A MEAL. 10/28/21   O'NealRonnald Ramp, MD  diclofenac Sodium (VOLTAREN) 1 % GEL APPLY 1 APPLICATION TOPICALLY 3 TIMES DAILY AS NEEDED FOR PAIN 07/03/22   O'Neal, Ronnald Ramp, MD  doxycycline (VIBRA-TABS) 100 MG tablet Take 1 tablet (100 mg total) by mouth 2 (two) times daily. 08/25/22   Heilingoetter, Cassandra L, PA-C  DULoxetine (CYMBALTA)  30 MG capsule Take 1 capsule (30 mg total) by mouth daily. 06/23/22   Antony Madura, MD  ferrous sulfate 325 (65 FE) MG tablet Take 325 mg by mouth daily with breakfast.    [provider]  fluticasone (FLONASE) 50 MCG/ACT nasal spray PLACE 1 SPRAY INTO BOTH NOSTRILS DAILY AS NEEDED (CONGESTION). 05/13/21   [name], DO  fluticasone-salmeterol (ADVAIR) 100-50 MCG/ACT AEPB Inhale 1 puff into the lungs 2 (two) times daily. 11/23/21   [name], DO  folic acid (FOLVITE) 1 MG tablet Take 1 tablet (1 mg total) by mouth daily. 03/31/22   Si Gaul, MD  furosemide (LASIX) 40 MG tablet Take 1 tablet (40 mg total) by mouth daily as needed. If you gain 3 lbs in 24 hours or 5 lbs in 1 week 07/17/21   Barrett, Erin R, PA-C  glucose blood (ACCU-CHEK AVIVA PLUS) test strip Use as instructed 02/02/22   [name], DO  hyoscyamine (LEVSIN SL) 0.125 MG SL tablet Place 1 tablet (0.125 mg total) under the tongue every 6 (six) hours as needed. For abdominal cramping 03/10/22   Heilingoetter, Cassandra L, PA-C  insulin glargine  (LANTUS) 100 UNIT/ML Solostar Pen Inject 24 Units into the skin every morning. 05/03/22   [name], DO  Insulin Pen Needle 32G X 4 MM MISC 1 Needle by Does not apply route in the morning and at bedtime. 02/02/22   [name], DO  ipratropium (ATROVENT HFA) 17 MCG/ACT inhaler Inhale 2 puffs into the lungs every 4 (four) hours as needed for wheezing (COPD).    [provider]  lactulose (CHRONULAC) 10 GM/15ML solution Take 30 mLs (20 g total) by mouth 2 (two) times daily as needed for mild constipation, moderate constipation or severe constipation. 03/10/22   Pickenpack-Cousar, Arty Baumgartner, NP  lidocaine-prilocaine (EMLA) cream APPLY 1 APPLICATION TOPICALLY AS NEEDED. 02/01/22   Heilingoetter, Cassandra L, PA-C  LORazepam (ATIVAN) 0.5 MG tablet Take 1 tablet (0.5 mg total) by mouth every 8 (eight) hours as needed for anxiety (nausea). 03/10/22   Pickenpack-Cousar, Arty Baumgartner, NP  megestrol (MEGACE) 20 MG tablet Take 1 tablet (20 mg total) by mouth 2 (two) times daily. 08/26/22   Pickenpack-Cousar, Arty Baumgartner, NP  metoCLOPramide (REGLAN) 10 MG tablet Take 1 tablet (10 mg total) by mouth 3 (three) times daily before meals. 05/12/22   Pickenpack-Cousar, Arty Baumgartner, NP  nicotine polacrilex (NICORETTE) 2 MG gum Take 1 each (2 mg total) by mouth as needed for smoking cessation. 06/03/21   [name], DO  nitroGLYCERIN (NITROSTAT) 0.4 MG SL tablet Place 1 tablet (0.4 mg total) under the tongue every 5 (five) minutes as needed for chest pain. 04/24/20   [name], DO  nystatin (MYCOSTATIN) 100000 UNIT/ML suspension Take 5 mLs (500,000 Units total) by mouth 4 (four) times daily. 09/28/21   Walisiewicz, Yvonna Alanis E, PA-C  ondansetron (ZOFRAN-ODT) 8 MG disintegrating tablet Take 1 tablet (8 mg total) by mouth every 8 (eight) hours as needed for nausea or vomiting. 01/28/22   Heilingoetter, Cassandra L, PA-C  oxyCODONE 10 MG TABS Take 1 tablet (10 mg total) by mouth every 4 (four) hours as  needed for severe pain. 08/26/22   Pickenpack-Cousar, Arty Baumgartner, NP  oxyCODONE ER (XTAMPZA ER) 13.5 MG C12A Take 13.5 mg by mouth every 12 (twelve) hours. 09/14/22   Pickenpack-Cousar, Arty Baumgartner, NP  pantoprazole (PROTONIX) 40 MG tablet Take 1 tablet (40 mg total) by mouth 2 (two) times daily. 06/23/22   Zehr, Shanda Bumps  D, PA-C  polyethylene glycol powder (GAVILAX) 17 GM/SCOOP powder TAKE ONE CAPFUL (17 GRAMS )BY MOUTH DAILY AS NEEDED FOR MILD OR MODERATE CONSTIPATION. 04/13/22   [name], DO  potassium chloride SA (KLOR-CON M) 20 MEQ tablet Take 1 tablet (20 mEq total) by mouth daily as needed. On days you take Lasix 07/17/21   Barrett, Erin R, PA-C  prochlorperazine (COMPAZINE) 10 MG tablet TAKE 1 TABLET (10 MG TOTAL) BY MOUTH EVERY 6 (SIX) HOURS AS NEEDED FOR NAUSEA OR VOMITING. 05/12/22   Heilingoetter, Cassandra L, PA-C  valACYclovir (VALTREX) 1000 MG tablet Take for 3 days prn each outbreak. 07/08/22   Brock Bad, MD  vitamin B-12 (CYANOCOBALAMIN) 1000 MCG tablet Take 1,000 mcg by mouth daily.    [provider]      Allergies    Amoxil [amoxicillin], Chantix [varenicline], Suboxone [buprenorphine hcl-naloxone hcl], and Emend [fosaprepitant dimeglumine]    Review of Systems   Review of Systems  Cardiovascular:  Positive for chest pain.    Physical Exam Updated Vital Signs BP 128/83 (BP Location: Left Arm)   Pulse (!) 114   Temp 97.9 F (36.6 C) (Oral)   Resp 18   Ht 5\' 7"  (1.702 m)   Wt 78 kg   LMP 06/12/2016 (Approximate)   SpO2 100%   BMI 26.94 kg/m  Physical Exam Vitals and nursing note reviewed.  Constitutional:      Appearance: Normal appearance.  HENT:     Head: Normocephalic and atraumatic.     Mouth/Throat:     Mouth: Mucous membranes are moist.  Eyes:     General: No scleral icterus. Cardiovascular:     Rate and Rhythm: Normal rate and regular rhythm.     Pulses: Normal pulses.     Heart sounds: Normal heart sounds.  Pulmonary:     Effort:  Pulmonary effort is normal.     Breath sounds: Normal breath sounds.  Abdominal:     General: Abdomen is flat.     Palpations: Abdomen is soft.     Tenderness: There is no abdominal tenderness.  Musculoskeletal:        General: No deformity.  Skin:    General: Skin is warm.     Findings: No rash.  Neurological:     General: No focal deficit present.     Mental Status: She is alert.  Psychiatric:        Mood and Affect: Mood normal.     ED Results / Procedures / Treatments   Labs (all labs ordered are listed, but only abnormal results are displayed) Labs Reviewed  BASIC METABOLIC PANEL - Abnormal; Notable for the following components:      Result Value   Potassium 3.2 (*)    Glucose, Bld 181 (*)    Calcium 8.8 (*)    All other components within normal limits  CBC - Abnormal; Notable for the following components:   RBC 2.67 (*)    Hemoglobin 9.5 (*)    HCT 28.0 (*)    MCV 104.9 (*)    MCH 35.6 (*)    All other components within normal limits  D-DIMER, QUANTITATIVE - Abnormal; Notable for the following components:   D-Dimer, Quant 3.81 (*)    All other components within normal limits  TROPONIN I (HIGH SENSITIVITY) - Abnormal; Notable for the following components:   Troponin I (High Sensitivity) 118 (*)    All other components within normal limits  TROPONIN I (HIGH SENSITIVITY)  EKG None  Radiology DG Chest 2 View  Result Date: 09/28/2022 CLINICAL DATA:  History of non-small cell carcinoma with chest pain, initial encounter EXAM: CHEST - 2 VIEW COMPARISON:  08/18/2021, chest CT from 08/02/2022 FINDINGS: Cardiac shadow is within normal limits. Postsurgical changes are again seen. Left chest wall port is noted. Lungs are well aerated bilaterally. Previously seen right upper lobe nodule is not well appreciated on today's exam. IMPRESSION: No acute abnormality noted. Known right upper lobe nodule is not well appreciated on today's exam. Electronically Signed   By: Alcide Clever M.D.   On: 09/28/2022 19:35    Procedures Procedures  {Document cardiac monitor, telemetry assessment procedure when appropriate:1}  Medications Ordered in ED Medications  oxyCODONE-acetaminophen (PERCOCET/ROXICET) 5-325 MG per tablet 1 tablet (has no administration in time range)    ED Course/ Medical Decision Making/ A&P   {   Click here for ABCD2, HEART and other calculatorsREFRESH Note before signing :1}                              Medical Decision Making Amount and/or Complexity of Data Reviewed Labs: ordered. Radiology: ordered.  Risk OTC drugs. Prescription drug management.   This patient presents to the ED for chest pain, this involves an extensive number of treatment options, and is a complaint that carries with a high risk of complications and morbidity.  The differential diagnosis includes ACS, pericarditis, PE, pneumothorax, pneumonia, less likely dissection with essentially normal blood pressure, symmetric bilateral pulses, and no back pain. This is not an exhaustive list.  Lab tests: I ordered and personally interpreted labs.  The pertinent results include: WBC unremarkable. Hbg unremarkable. Platelets unremarkable. Electrolytes significant for potassium 3.2. BUN, creatinine unremarkable.  Troponin elevated at 118 and 183.  D-dimer 3.81.  Imaging studies: I ordered imaging studies, personally reviewed, interpreted imaging and agree with the radiologist's interpretations. The results include: No evidence of PE.  Chest x-ray showed no acute cardiopulmonary abnormalities.  Problem list/ ED course/ Critical interventions/ Medical management: HPI: See above Vital signs within normal range and stable throughout visit. Laboratory/imaging studies significant for: See above. On physical examination, patient is afebrile and appears in no acute distress.  EKG shows sinus tachycardia, no ischemic changes.  Troponins elevated 118 and 183.  D-dimer elevated at 3.81 but  CT angio chest showed no evidence of PE.  Will consult cardiology.  Given Percocet and aspirin for pain.  Reevaluation of patient after this medication showed that pain has improved. I have reviewed the patient home medicines and have made adjustments as needed.  Cardiac monitoring/EKG: The patient was maintained on a cardiac monitor.  I personally reviewed and interpreted the cardiac monitor which showed an underlying rhythm of: sinus rhythm.  Additional history obtained: External records from outside source obtained and reviewed including: Chart review including previous notes, labs, imaging.  Consultations obtained:   Disposition Admit.  This chart was dictated using voice recognition software.  Despite best efforts to proofread,  errors can occur which can change the documentation meaning.    {Document critical care time when appropriate:1} {Document review of labs and clinical decision tools ie heart score, Chads2Vasc2 etc:1}  {Document your independent review of radiology images, and any outside records:1} {Document your discussion with family members, caretakers, and with consultants:1} {Document social determinants of health affecting pt's care:1} {Document your decision making why or why not admission, treatments were needed:1} Final Clinical Impression(s) /  ED Diagnoses Final diagnoses:  Chest pain, unspecified type    Rx / DC Orders ED Discharge Orders     None

## 2022-09-28 NOTE — ED Provider Notes (Incomplete)
[place] EMERGENCY DEPARTMENT AT [facility] Provider Note   CSN: [id number] Arrival date & time: 09/28/22  1743     History {Add pertinent medical, surgical, social history, OB history to HPI:1} Chief Complaint  Patient presents with   Chest Pain    Cassidy [name] is a 57 y.o. female with a past history significant for triple bypass surgery, lung cancer, diabetes, hypertension presents today for evaluation of chest pain.  Symptom has been going on for about a week.  She also report palpitation and shortness of breath on exertion.  States that the last 3 days she has has intermittent nausea and vomiting.   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  s/p PCTA to dRCA and ostial RPAV-PLA vessel + PDA branch (05/2012)  b. [place] s/p DES to mLAD - Resolute DES 3.0 x 22 (3.5mm -->3.3 mm)   Bipolar depression (HCC)    Breast abscess    a. right side.    COPD (chronic obstructive pulmonary disease) (HCC)    Depression    Diabetic peripheral neuropathy (HCC)    Fibromyalgia    Genital warts    GERD (gastroesophageal reflux disease)    History of hiatal hernia    HLD (hyperlipidemia)    Hypertension    Lung cancer (HCC)    Pain in limb    a. Abubakar Crispo VENOUS DUPLEX, 02/05/2009 - no evidence of deep vein thrombosis, Baker's cyst   Pneumonia    PTSD (post-traumatic stress disorder)    Sickle cell trait (HCC)    Tobacco abuse    Tooth caries    Type II diabetes mellitus (HCC)    Past Surgical History:  Procedure Laterality Date   BLADDER SURGERY  1980   "TVT"   BLADDER SURGERY  2003   "TVT"   BREAST EXCISIONAL BIOPSY     BREAST SURGERY Right    I&D for multiple abscesses   CARDIAC CATHETERIZATION     CORONARY ARTERY BYPASS GRAFT N/A 06/30/2021   Procedure: CORONARY  ARTERY BYPASS GRAFTING (CABG) x3 USING LEFT INTERNAL MAMMARY ARTERY,  RIGHT RADIAL ARTERY AND LEFT GREATER SAPHENOUS VEIN;  Surgeon: Corliss Skains, MD;  Location: [facility] OR;  Service: Open Heart Surgery;  Laterality: N/A;   cutting balloon     ENDOVEIN HARVEST OF GREATER SAPHENOUS VEIN Left 06/30/2021   Procedure: ENDOVEIN HARVEST OF GREATER SAPHENOUS VEIN;  Surgeon: Corliss Skains, MD;  Location: [facility] OR;  Service: Open Heart Surgery;  Laterality: Left;   EYE SURGERY Left    laser surgery   FINE NEEDLE ASPIRATION  08/21/2021   Procedure: FINE NEEDLE ASPIRATION (FNA) LINEAR;  Surgeon: Josephine Igo, DO;  Location: [facility] ENDOSCOPY;  Service: Pulmonary;;   INCISE AND DRAIN ABCESS  ; 04/26/11; 08/13/11   right breast   INCISION AND DRAINAGE ABSCESS Right 05/09/2012   Procedure: INCISION AND DRAINAGE RIGHT BREAST ABSCESS;  Surgeon: Wilmon Arms. Corliss Skains, MD;  Location: [facility] OR;  Service: General;  Laterality: Right;   INCISION AND DRAINAGE ABSCESS Right 02/08/2014   Procedure: INCISION AND DRAINAGE RIGHT BREAST ABSCESS;  Surgeon: Manus Rudd, MD;  Location: [facility] OR;  Service: General;  Laterality: Right;   INCISION AND DRAINAGE ABSCESS Right 06/14/2014   Procedure: INCISION AND DRAINAGE  RIGHT BREAST ABSCESS;  Surgeon: Manus Rudd, MD;  Location: [facility] OR;  Service: General;  Laterality: Right;   IR IMAGING GUIDED PORT INSERTION  12/08/2021   IRRIGATION AND DEBRIDEMENT ABSCESS  12/19/2010   Procedure: IRRIGATION AND DEBRIDEMENT ABSCESS;  Surgeon: Emelia Loron, MD;  Location: [facility] OR;  Service: General;  Laterality: Right;   IRRIGATION AND DEBRIDEMENT ABSCESS  08/13/2011   Procedure: MINOR INCISION AND DRAINAGE OF ABSCESS;  Surgeon: Cherylynn Ridges, MD;  Location: [facility] OR;  Service: General;  Laterality: Right;  Right Breast    IRRIGATION AND DEBRIDEMENT ABSCESS  11/17/2011   Procedure: IRRIGATION AND DEBRIDEMENT ABSCESS;  Surgeon: Wilmon Arms. Corliss Skains, MD;  Location: [facility] OR;  Service: General;  Laterality:  Right;  irrigation and debridement right recurrent breast abscess   LARYNX SURGERY     LEFT HEART CATH AND CORONARY ANGIOGRAPHY N/A 04/09/2021   Procedure: LEFT HEART CATH AND CORONARY ANGIOGRAPHY;  Surgeon: Corky Crafts, MD;  Location: [facility] INVASIVE CV LAB;  Service: Cardiovascular;  Laterality: N/A;   LEFT HEART CATHETERIZATION WITH CORONARY ANGIOGRAM N/A 05/29/2012   Procedure: LEFT HEART CATHETERIZATION WITH CORONARY ANGIOGRAM & PTCA;  Surgeon: Lennette Bihari, MD;  Location: [facility] CATH LAB;  Service: Cardiovascular; Bifurcation dRCA-RPAD/PLA & rPDA  PTCA.   LEFT HEART CATHETERIZATION WITH CORONARY ANGIOGRAM N/A 11/08/2013   Procedure: LEFT HEART CATHETERIZATION WITH CORONARY ANGIOGRAM and Coronary Stent Intervention;  Surgeon: Marykay Lex, MD;  Location: [facility] CATH LAB;  Service: Cardiovascular; mLAD 80% (FFR 0.73) - resolute DES 3.0 x 22 mm (postdilated to 3.3 mm->3.5 mm)   NM MYOVIEW LTD  01/26/2009   Normal study, no evidence of ischemia, EF 67%   ployp removed from voice box 03/30/12     RADIAL ARTERY HARVEST Right 06/30/2021   Procedure: RADIAL ARTERY HARVEST;  Surgeon: Corliss Skains, MD;  Location: [facility] OR;  Service: Open Heart Surgery;  Laterality: Right;   REFRACTIVE SURGERY  ~ 2010   right   TEE WITHOUT CARDIOVERSION N/A 06/30/2021   Procedure: TRANSESOPHAGEAL ECHOCARDIOGRAM (TEE);  Surgeon: Corliss Skains, MD;  Location: [facility] OR;  Service: Open Heart Surgery;  Laterality: N/A;   TONSILLECTOMY  1988   VIDEO BRONCHOSCOPY WITH ENDOBRONCHIAL ULTRASOUND Bilateral 08/21/2021   Procedure: VIDEO BRONCHOSCOPY WITH ENDOBRONCHIAL ULTRASOUND;  Surgeon: Josephine Igo, DO;  Location: [facility] ENDOSCOPY;  Service: Pulmonary;  Laterality: Bilateral;     Home Medications Prior to Admission medications   Medication Sig Start Date End Date Taking? Authorizing Provider  aspirin EC 81 MG tablet Take 81 mg by mouth daily.    [provider]  atorvastatin (LIPITOR) 40 MG tablet TAKE  1 TABLET (40 MG TOTAL) BY MOUTH DAILY. 04/23/22   [name], DO  busPIRone (BUSPAR) 15 MG tablet Take 0.5 tablets (7.5 mg total) by mouth 2 (two) times daily. 05/20/22   [name], DO  Carboxymethylcellul-Glycerin (LUBRICATING EYE DROPS OP) Place 1 drop into both eyes daily as needed (dry eyes).    [provider]  carvedilol (COREG) 12.5 MG tablet TAKE 1 TABLET (12.5 MG TOTAL) BY MOUTH 2 (TWO) TIMES DAILY WITH A MEAL. 10/28/21   O'NealRonnald Ramp, MD  diclofenac Sodium (VOLTAREN) 1 % GEL APPLY 1 APPLICATION TOPICALLY 3 TIMES DAILY AS NEEDED FOR PAIN 07/03/22   O'Neal, Ronnald Ramp, MD  doxycycline (VIBRA-TABS) 100 MG tablet Take 1 tablet (100 mg total) by mouth 2 (two) times daily. 08/25/22   Heilingoetter, Cassandra L, PA-C  DULoxetine (CYMBALTA) 30 MG capsule  Take 1 capsule (30 mg total) by mouth daily. 06/23/22   Antony Madura, MD  ferrous sulfate 325 (65 FE) MG tablet Take 325 mg by mouth daily with breakfast.    [provider]  fluticasone (FLONASE) 50 MCG/ACT nasal spray PLACE 1 SPRAY INTO BOTH NOSTRILS DAILY AS NEEDED (CONGESTION). 05/13/21   [name], DO  fluticasone-salmeterol (ADVAIR) 100-50 MCG/ACT AEPB Inhale 1 puff into the lungs 2 (two) times daily. 11/23/21   [name], DO  folic acid (FOLVITE) 1 MG tablet Take 1 tablet (1 mg total) by mouth daily. 03/31/22   Si Gaul, MD  furosemide (LASIX) 40 MG tablet Take 1 tablet (40 mg total) by mouth daily as needed. If you gain 3 lbs in 24 hours or 5 lbs in 1 week 07/17/21   Barrett, Erin R, PA-C  glucose blood (ACCU-CHEK AVIVA PLUS) test strip Use as instructed 02/02/22   [name], DO  hyoscyamine (LEVSIN SL) 0.125 MG SL tablet Place 1 tablet (0.125 mg total) under the tongue every 6 (six) hours as needed. For abdominal cramping 03/10/22   Heilingoetter, Cassandra L, PA-C  insulin glargine (LANTUS) 100 UNIT/ML Solostar Pen Inject 24 Units into the skin every morning.  05/03/22   [name], DO  Insulin Pen Needle 32G X 4 MM MISC 1 Needle by Does not apply route in the morning and at bedtime. 02/02/22   [name], DO  ipratropium (ATROVENT HFA) 17 MCG/ACT inhaler Inhale 2 puffs into the lungs every 4 (four) hours as needed for wheezing (COPD).    [provider]  lactulose (CHRONULAC) 10 GM/15ML solution Take 30 mLs (20 g total) by mouth 2 (two) times daily as needed for mild constipation, moderate constipation or severe constipation. 03/10/22   Pickenpack-Cousar, Arty Baumgartner, NP  lidocaine-prilocaine (EMLA) cream APPLY 1 APPLICATION TOPICALLY AS NEEDED. 02/01/22   Heilingoetter, Cassandra L, PA-C  LORazepam (ATIVAN) 0.5 MG tablet Take 1 tablet (0.5 mg total) by mouth every 8 (eight) hours as needed for anxiety (nausea). 03/10/22   Pickenpack-Cousar, Arty Baumgartner, NP  megestrol (MEGACE) 20 MG tablet Take 1 tablet (20 mg total) by mouth 2 (two) times daily. 08/26/22   Pickenpack-Cousar, Arty Baumgartner, NP  metoCLOPramide (REGLAN) 10 MG tablet Take 1 tablet (10 mg total) by mouth 3 (three) times daily before meals. 05/12/22   Pickenpack-Cousar, Arty Baumgartner, NP  nicotine polacrilex (NICORETTE) 2 MG gum Take 1 each (2 mg total) by mouth as needed for smoking cessation. 06/03/21   [name], DO  nitroGLYCERIN (NITROSTAT) 0.4 MG SL tablet Place 1 tablet (0.4 mg total) under the tongue every 5 (five) minutes as needed for chest pain. 04/24/20   [name], DO  nystatin (MYCOSTATIN) 100000 UNIT/ML suspension Take 5 mLs (500,000 Units total) by mouth 4 (four) times daily. 09/28/21   Walisiewicz, Yvonna Alanis E, PA-C  ondansetron (ZOFRAN-ODT) 8 MG disintegrating tablet Take 1 tablet (8 mg total) by mouth every 8 (eight) hours as needed for nausea or vomiting. 01/28/22   Heilingoetter, Cassandra L, PA-C  oxyCODONE 10 MG TABS Take 1 tablet (10 mg total) by mouth every 4 (four) hours as needed for severe pain. 08/26/22   Pickenpack-Cousar, Arty Baumgartner, NP  oxyCODONE ER  (XTAMPZA ER) 13.5 MG C12A Take 13.5 mg by mouth every 12 (twelve) hours. 09/14/22   Pickenpack-Cousar, Arty Baumgartner, NP  pantoprazole (PROTONIX) 40 MG tablet Take 1 tablet (40 mg total) by mouth 2 (two) times daily. 06/23/22   Zehr, Princella Pellegrini, PA-C  polyethylene glycol powder (GAVILAX) 17 GM/SCOOP powder TAKE ONE CAPFUL (17 GRAMS )BY MOUTH DAILY AS NEEDED FOR MILD OR MODERATE CONSTIPATION. 04/13/22   [name], DO  potassium chloride SA (KLOR-CON M) 20 MEQ tablet Take 1 tablet (20 mEq total) by mouth daily as needed. On days you take Lasix 07/17/21   Barrett, Erin R, PA-C  prochlorperazine (COMPAZINE) 10 MG tablet TAKE 1 TABLET (10 MG TOTAL) BY MOUTH EVERY 6 (SIX) HOURS AS NEEDED FOR NAUSEA OR VOMITING. 05/12/22   Heilingoetter, Cassandra L, PA-C  valACYclovir (VALTREX) 1000 MG tablet Take for 3 days prn each outbreak. 07/08/22   Brock Bad, MD  vitamin B-12 (CYANOCOBALAMIN) 1000 MCG tablet Take 1,000 mcg by mouth daily.    [provider]      Allergies    Amoxil [amoxicillin], Chantix [varenicline], Suboxone [buprenorphine hcl-naloxone hcl], and Emend [fosaprepitant dimeglumine]    Review of Systems   Review of Systems  Cardiovascular:  Positive for chest pain.    Physical Exam Updated Vital Signs BP 128/83 (BP Location: Left Arm)   Pulse (!) 114   Temp 97.9 F (36.6 C) (Oral)   Resp 18   Ht 5\' 7"  (1.702 m)   Wt 78 kg   LMP 06/12/2016 (Approximate)   SpO2 100%   BMI 26.94 kg/m  Physical Exam  ED Results / Procedures / Treatments   Labs (all labs ordered are listed, but only abnormal results are displayed) Labs Reviewed  BASIC METABOLIC PANEL - Abnormal; Notable for the following components:      Result Value   Potassium 3.2 (*)    Glucose, Bld 181 (*)    Calcium 8.8 (*)    All other components within normal limits  CBC - Abnormal; Notable for the following components:   RBC 2.67 (*)    Hemoglobin 9.5 (*)    HCT 28.0 (*)    MCV 104.9 (*)    MCH 35.6 (*)     All other components within normal limits  D-DIMER, QUANTITATIVE - Abnormal; Notable for the following components:   D-Dimer, Quant 3.81 (*)    All other components within normal limits  TROPONIN I (HIGH SENSITIVITY) - Abnormal; Notable for the following components:   Troponin I (High Sensitivity) 118 (*)    All other components within normal limits  TROPONIN I (HIGH SENSITIVITY)    EKG None  Radiology DG Chest 2 View  Result Date: 09/28/2022 CLINICAL DATA:  History of non-small cell carcinoma with chest pain, initial encounter EXAM: CHEST - 2 VIEW COMPARISON:  08/18/2021, chest CT from 08/02/2022 FINDINGS: Cardiac shadow is within normal limits. Postsurgical changes are again seen. Left chest wall port is noted. Lungs are well aerated bilaterally. Previously seen right upper lobe nodule is not well appreciated on today's exam. IMPRESSION: No acute abnormality noted. Known right upper lobe nodule is not well appreciated on today's exam. Electronically Signed   By: Alcide Clever M.D.   On: 09/28/2022 19:35    Procedures Procedures  {Document cardiac monitor, telemetry assessment procedure when appropriate:1}  Medications Ordered in ED Medications  oxyCODONE-acetaminophen (PERCOCET/ROXICET) 5-325 MG per tablet 1 tablet (has no administration in time range)    ED Course/ Medical Decision Making/ A&P   {   Click here for ABCD2, HEART and other calculatorsREFRESH Note before signing :1}  Medical Decision Making Amount and/or Complexity of Data Reviewed Labs: ordered. Radiology: ordered.  Risk OTC drugs. Prescription drug management.   ***  {Document critical care time when appropriate:1} {Document review of labs and clinical decision tools ie heart score, Chads2Vasc2 etc:1}  {Document your independent review of radiology images, and any outside records:1} {Document your discussion with family members, caretakers, and with consultants:1} {Document  social determinants of health affecting pt's care:1} {Document your decision making why or why not admission, treatments were needed:1} Final Clinical Impression(s) / ED Diagnoses Final diagnoses:  None    Rx / DC Orders ED Discharge Orders     None

## 2022-09-28 NOTE — ED Triage Notes (Signed)
Per Ems Pt is coming from home. Complaining of palpitations and chest pain for a week, and nausea/emesis for 3 days.

## 2022-09-28 NOTE — ED Provider Triage Note (Signed)
Emergency Medicine Provider Triage Evaluation Note  Cassidy [name] , a 57 y.o. female  was evaluated in triage.  Pt complains of chest pain that has been going on for a week. Reports ambulation and shortness of breath, worse with ambulation, nausea and vomiting in 3 days. Hx of triple bypass last year, lung cancer, last chemo on 09/23/2022.  Review of Systems  Positive: As above Negative: As above  Physical Exam  BP (!) 147/89   Pulse (!) 117   Temp 98.4 F (36.9 C) (Oral)   Resp 15   Ht 5\' 7"  (1.702 m)   Wt 78 kg   LMP 06/12/2016 (Approximate)   SpO2 (!) 10%   BMI 26.94 kg/m  Gen:   Awake, no distress   Resp:  Normal effort  MSK:   Moves extremities without difficulty  Other:    Medical Decision Making  Medically screening exam initiated at 6:36 PM.  Appropriate orders placed.  Cassidy [name] was informed that the remainder of the evaluation will be completed by another provider, this initial triage assessment does not replace that evaluation, and the importance of remaining in the ED until their evaluation is complete.     Cassidy [name], [place] 09/28/22 Cassidy [name]

## 2022-09-29 ENCOUNTER — Ambulatory Visit (HOSPITAL_COMMUNITY)
Admit: 2022-09-29 | Discharge: 2022-09-29 | Disposition: A | Discharge status: Home / Self Care | Payer: Medicaid Other | Attending: Cardiology | Admitting: Cardiology

## 2022-09-29 ENCOUNTER — Other Ambulatory Visit (HOSPITAL_COMMUNITY): Payer: Medicaid Other

## 2022-09-29 ENCOUNTER — Observation Stay (HOSPITAL_COMMUNITY): Payer: Medicaid Other

## 2022-09-29 DIAGNOSIS — E876 Hypokalemia: Secondary | ICD-10-CM

## 2022-09-29 DIAGNOSIS — R072 Precordial pain: Secondary | ICD-10-CM | POA: Diagnosis not present

## 2022-09-29 DIAGNOSIS — R112 Nausea with vomiting, unspecified: Secondary | ICD-10-CM

## 2022-09-29 DIAGNOSIS — R188 Other ascites: Secondary | ICD-10-CM | POA: Diagnosis not present

## 2022-09-29 DIAGNOSIS — C3491 Malignant neoplasm of unspecified part of right bronchus or lung: Secondary | ICD-10-CM

## 2022-09-29 DIAGNOSIS — R079 Chest pain, unspecified: Secondary | ICD-10-CM | POA: Diagnosis not present

## 2022-09-29 DIAGNOSIS — I2489 Other forms of acute ischemic heart disease: Secondary | ICD-10-CM | POA: Diagnosis not present

## 2022-09-29 DIAGNOSIS — E1165 Type 2 diabetes mellitus with hyperglycemia: Secondary | ICD-10-CM

## 2022-09-29 DIAGNOSIS — Z794 Long term (current) use of insulin: Secondary | ICD-10-CM

## 2022-09-29 DIAGNOSIS — R109 Unspecified abdominal pain: Secondary | ICD-10-CM

## 2022-09-29 DIAGNOSIS — D1803 Hemangioma of intra-abdominal structures: Secondary | ICD-10-CM | POA: Diagnosis not present

## 2022-09-29 DIAGNOSIS — Z951 Presence of aortocoronary bypass graft: Secondary | ICD-10-CM | POA: Diagnosis not present

## 2022-09-29 DIAGNOSIS — J90 Pleural effusion, not elsewhere classified: Secondary | ICD-10-CM

## 2022-09-29 DIAGNOSIS — C3490 Malignant neoplasm of unspecified part of unspecified bronchus or lung: Secondary | ICD-10-CM | POA: Diagnosis not present

## 2022-09-29 DIAGNOSIS — K219 Gastro-esophageal reflux disease without esophagitis: Secondary | ICD-10-CM

## 2022-09-29 LAB — BASIC METABOLIC PANEL WITH GFR
Anion gap: 10 (ref 5–15)
BUN: 11 mg/dL (ref 6–20)
CO2: 27 mmol/L (ref 22–32)
Calcium: 8.5 mg/dL — ABNORMAL LOW (ref 8.9–10.3)
Chloride: 98 mmol/L (ref 98–111)
Creatinine, Ser: 0.85 mg/dL (ref 0.44–1.00)
GFR, Estimated: >60 mL/min (ref >60)
Glucose, Bld: 158 mg/dL — ABNORMAL HIGH (ref 70–99)
Potassium: 3.6 mmol/L (ref 3.5–5.1)
Sodium: 135 mmol/L (ref 135–145)

## 2022-09-29 LAB — TROPONIN I (HIGH SENSITIVITY)
Troponin I (High Sensitivity): 192 ng/L (ref <18)
Troponin I (High Sensitivity): 173 ng/L (ref <18)

## 2022-09-29 LAB — CBC
HCT: 24 % — ABNORMAL LOW (ref 36.0–46.0)
Hemoglobin: 8 g/dL — ABNORMAL LOW (ref 12.0–15.0)
MCH: 35.1 pg — ABNORMAL HIGH (ref 26.0–34.0)
MCHC: 33.3 g/dL (ref 30.0–36.0)
MCV: 105.3 fL — ABNORMAL HIGH (ref 80.0–100.0)
Platelets: 160 10*3/uL (ref 150–400)
RBC: 2.28 MIL/uL — ABNORMAL LOW (ref 3.87–5.11)
RDW: 14.3 % (ref 11.5–15.5)
WBC: 5.3 10*3/uL (ref 4.0–10.5)
nRBC: 0 % (ref 0.0–0.2)

## 2022-09-29 LAB — ECHOCARDIOGRAM COMPLETE
AR max vel: 2.51 cm2
AV Area VTI: 2.67 cm2
AV Area mean vel: 2.37 cm2
AV Mean grad: 6.4 mmHg
AV Peak grad: 13.4 mmHg
Ao pk vel: 1.83 m/s
Area-P 1/2: 4.96 cm2
Height: 67 in
S' Lateral: 2.9 cm
Weight: 2752 [oz_av]

## 2022-09-29 LAB — NM MYOCAR MULTI W/SPECT W/WALL MOTION / EF
Base ST Depression (mm): 0 mm
MPHR: 114 {beats}/min
Peak HR: 114 {beats}/min
Percent HR: 70 %
Rest HR: 76 {beats}/min
ST Depression (mm): 0 mm

## 2022-09-29 LAB — CBG MONITORING, ED
Glucose-Capillary: 166 mg/dL — ABNORMAL HIGH (ref 70–99)
Glucose-Capillary: 131 mg/dL — ABNORMAL HIGH (ref 70–99)
Glucose-Capillary: 147 mg/dL — ABNORMAL HIGH (ref 70–99)

## 2022-09-29 LAB — HEPARIN LEVEL (UNFRACTIONATED)
Heparin Unfractionated: <0.1 [IU]/mL — ABNORMAL LOW (ref 0.30–0.70)
Heparin Unfractionated: 0.23 [IU]/mL — ABNORMAL LOW (ref 0.30–0.70)

## 2022-09-29 LAB — GLUCOSE, CAPILLARY
Glucose-Capillary: 136 mg/dL — ABNORMAL HIGH (ref 70–99)
Glucose-Capillary: 175 mg/dL — ABNORMAL HIGH (ref 70–99)

## 2022-09-29 LAB — BRAIN NATRIURETIC PEPTIDE: B Natriuretic Peptide: 112.4 pg/mL — ABNORMAL HIGH (ref 0.0–100.0)

## 2022-09-29 LAB — MAGNESIUM: Magnesium: 1.9 mg/dL (ref 1.7–2.4)

## 2022-09-29 MED ORDER — REGADENOSON 0.4 MG/5ML IV SOLN
INTRAVENOUS | Status: AC
  Filled 2022-09-29: qty 5

## 2022-09-29 MED ORDER — TECHNETIUM TC 99M TETROFOSMIN IV KIT
9.8000 | PACK | Freq: Once | INTRAVENOUS | Status: AC | PRN
  Administered 2022-09-29: 9.8 via INTRAVENOUS

## 2022-09-29 MED ORDER — HEPARIN BOLUS VIA INFUSION
4000.0000 [IU] | Freq: Once | INTRAVENOUS | Status: AC
  Administered 2022-09-29: 4000 [IU] via INTRAVENOUS
  Filled 2022-09-29: qty 4000

## 2022-09-29 MED ORDER — PANTOPRAZOLE SODIUM 40 MG IV SOLR
40.0000 mg | INTRAVENOUS | Status: DC
  Administered 2022-09-29 – 2022-09-30 (×2): 40 mg via INTRAVENOUS
  Filled 2022-09-29 (×2): qty 10

## 2022-09-29 MED ORDER — BISACODYL 10 MG RE SUPP: 10.0000 mg | Freq: Every day | RECTAL | Status: DC | PRN

## 2022-09-29 MED ORDER — HEPARIN BOLUS VIA INFUSION
2000.0000 [IU] | Freq: Once | INTRAVENOUS | Status: AC
  Administered 2022-09-29: 2000 [IU] via INTRAVENOUS
  Filled 2022-09-29: qty 2000

## 2022-09-29 MED ORDER — MOMETASONE FURO-FORMOTEROL FUM 100-5 MCG/ACT IN AERO
2.0000 | INHALATION_SPRAY | Freq: Two times a day (BID) | RESPIRATORY_TRACT | Status: DC
  Administered 2022-09-29 – 2022-10-01 (×4): 2 via RESPIRATORY_TRACT
  Filled 2022-09-29 (×2): qty 8.8

## 2022-09-29 MED ORDER — PANTOPRAZOLE SODIUM 40 MG IV SOLR: 40.0000 mg | Freq: Two times a day (BID) | INTRAVENOUS | Status: DC

## 2022-09-29 MED ORDER — INSULIN GLARGINE-YFGN 100 UNIT/ML ~~LOC~~ SOLN
12.0000 [IU] | Freq: Every day | SUBCUTANEOUS | Status: DC
  Administered 2022-09-29: 6 [IU] via SUBCUTANEOUS
  Administered 2022-09-30 – 2022-10-01 (×2): 12 [IU] via SUBCUTANEOUS
  Filled 2022-09-29 (×3): qty 0.12

## 2022-09-29 MED ORDER — INSULIN ASPART 100 UNIT/ML IJ SOLN
0.0000 [IU] | INTRAMUSCULAR | Status: DC
  Administered 2022-09-29: 2 [IU] via SUBCUTANEOUS
  Administered 2022-09-29 (×2): 1 [IU] via SUBCUTANEOUS
  Administered 2022-09-29: 2 [IU] via SUBCUTANEOUS
  Administered 2022-09-29 – 2022-09-30 (×2): 1 [IU] via SUBCUTANEOUS
  Administered 2022-09-30: 2 [IU] via SUBCUTANEOUS
  Administered 2022-10-01: 1 [IU] via SUBCUTANEOUS
  Filled 2022-09-29: qty 0.09

## 2022-09-29 MED ORDER — ASPIRIN 81 MG PO CHEW
243.0000 mg | CHEWABLE_TABLET | Freq: Once | ORAL | Status: AC
  Administered 2022-09-29: 243 mg via ORAL
  Filled 2022-09-29: qty 3

## 2022-09-29 MED ORDER — DOCUSATE SODIUM 100 MG PO CAPS
100.0000 mg | ORAL_CAPSULE | Freq: Every day | ORAL | Status: DC
  Administered 2022-09-29 – 2022-10-01 (×3): 100 mg via ORAL
  Filled 2022-09-29 (×3): qty 1

## 2022-09-29 MED ORDER — MORPHINE SULFATE (PF) 2 MG/ML IV SOLN
1.0000 mg | INTRAVENOUS | Status: DC | PRN
  Administered 2022-09-29 – 2022-10-01 (×7): 1 mg via INTRAVENOUS
  Filled 2022-09-29 (×7): qty 1

## 2022-09-29 MED ORDER — HEPARIN (PORCINE) 25000 UT/250ML-% IV SOLN
900.0000 [IU]/h | INTRAVENOUS | Status: DC
  Administered 2022-09-29: 900 [IU]/h via INTRAVENOUS
  Filled 2022-09-29: qty 250

## 2022-09-29 MED ORDER — NITROGLYCERIN 0.4 MG SL SUBL: 0.4000 mg | SUBLINGUAL_TABLET | SUBLINGUAL | Status: DC | PRN

## 2022-09-29 MED ORDER — CHLORHEXIDINE GLUCONATE CLOTH 2 % EX PADS
6.0000 | MEDICATED_PAD | Freq: Every day | CUTANEOUS | Status: DC
  Administered 2022-09-29 – 2022-10-01 (×3): 6 via TOPICAL

## 2022-09-29 MED ORDER — ONDANSETRON HCL 4 MG/2ML IJ SOLN
4.0000 mg | Freq: Four times a day (QID) | INTRAMUSCULAR | Status: DC | PRN
  Administered 2022-09-29 – 2022-10-01 (×3): 4 mg via INTRAVENOUS
  Filled 2022-09-29 (×3): qty 2

## 2022-09-29 MED ORDER — FLUTICASONE PROPIONATE 50 MCG/ACT NA SUSP: 1.0000 | Freq: Every day | NASAL | Status: DC | PRN

## 2022-09-29 MED ORDER — OXYCODONE-ACETAMINOPHEN 5-325 MG PO TABS
1.0000 | ORAL_TABLET | Freq: Four times a day (QID) | ORAL | Status: DC | PRN
  Administered 2022-09-29 – 2022-09-30 (×4): 1 via ORAL
  Filled 2022-09-29 (×4): qty 1

## 2022-09-29 MED ORDER — TECHNETIUM TC 99M TETROFOSMIN IV KIT
31.7000 | PACK | Freq: Once | INTRAVENOUS | Status: AC | PRN
  Administered 2022-09-29: 31.7 via INTRAVENOUS

## 2022-09-29 MED ORDER — REGADENOSON 0.4 MG/5ML IV SOLN
0.4000 mg | Freq: Once | INTRAVENOUS | Status: AC
  Administered 2022-09-29: 0.4 mg via INTRAVENOUS

## 2022-09-29 MED ORDER — INFLUENZA VIRUS VACC SPLIT PF (FLUZONE) 0.5 ML IM SUSY
0.5000 mL | PREFILLED_SYRINGE | INTRAMUSCULAR | Status: AC
  Administered 2022-09-30: 0.5 mL via INTRAMUSCULAR
  Filled 2022-09-29: qty 0.5

## 2022-09-29 MED ORDER — HEPARIN (PORCINE) 25000 UT/250ML-% IV SOLN
1050.0000 [IU]/h | INTRAVENOUS | Status: DC
  Administered 2022-09-29 (×2): 1050 [IU]/h via INTRAVENOUS
  Filled 2022-09-29: qty 250

## 2022-09-29 MED ORDER — MAGNESIUM SULFATE 2 GM/50ML IV SOLN
2.0000 g | Freq: Once | INTRAVENOUS | Status: AC
  Administered 2022-09-29: 2 g via INTRAVENOUS
  Filled 2022-09-29: qty 50

## 2022-09-29 MED ORDER — IPRATROPIUM BROMIDE HFA 17 MCG/ACT IN AERS: 2.0000 | INHALATION_SPRAY | RESPIRATORY_TRACT | Status: DC | PRN

## 2022-09-29 NOTE — H&P (Signed)
History and Physical    JOELE ZENO ZOX:[id number] DOB: 30-Mar-1965 DOA: 09/28/2022  PCP: [name], MD  Patient coming from: Home  Chief Complaint: Chest pain  HPI: ROMEE [name] is a 57 y.o. female with medical history significant of stage IV non-small cell lung cancer currently on chemotherapy, anemia, CAD, COPD, GERD, hypertension, hyperlipidemia, insulin-dependent type 2 diabetes, bipolar disorder, depression presented to ED with complaints of chest pain, shortness breath, palpitations, nausea, and vomiting.  Tachycardic to the 110s on arrival but afebrile.  Labs showing no leukocytosis, hemoglobin 9.5 (stable), potassium 3.2, magnesium 1.6, glucose 181, creatinine 0.8, troponin 183> 118, D-dimer 3.81.  CTA chest negative for PE.  Showing 13 mm spiculated lesion in the right upper lobe decreased in size from prior exam at which time it measured 15 mm.  Also showing small left pleural effusion.  EKG showing sinus tachycardia and no acute ischemic changes. ED PA discussed the case with on-call cardiologist Dr. Geraldo Pitter who recommended starting IV heparin and trending troponin.  Cardiologist felt that there was no need to urgently transfer the patient to [facility].  Recommended admitting the patient here at [facility] and their team will see her in the morning.  Patient was given aspirin, Zofran, Percocet, oral potassium 40 mEq, IV magnesium 2 g, and started on IV heparin.  TRH called to admit.  Patient is reporting chest pain and dyspnea on exertion for the past 1 week.  Chest pain is substernal/left-sided and she describes it as a squeezing sensation which can happen even at rest.  Associated with palpitations.  She has continued to have intermittent episodes over the week.  She is feeling short of breath walking even small distances.  She reports chronic bilateral lower extremity edema.  Also reports intermittent nausea and vomiting related to cancer treatment but for  the past 3 days she has been constantly vomiting and has not been able to tolerate any p.o. intake.  She is constipated and has not had any bowel movements for the past 4 days.  She reports generalized abdominal pain and history of intra-abdominal cancer metastases for which she takes pain medications at home.  Review of Systems:  Review of Systems  All other systems reviewed and are negative.   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  s/p PCTA to dRCA and ostial RPAV-PLA vessel + PDA branch (05/2012)  b. [place] s/p DES to mLAD - Resolute DES 3.0 x 22 (3.23mm -->3.3 mm)   Bipolar depression (HCC)    Breast abscess    a. right side.    COPD (chronic obstructive pulmonary disease) (HCC)    Depression    Diabetic peripheral neuropathy (HCC)    Fibromyalgia    Genital warts    GERD (gastroesophageal reflux disease)    History of hiatal hernia    HLD (hyperlipidemia)    Hypertension    Lung cancer (HCC)    Pain in limb    a. LE VENOUS DUPLEX, 02/05/2009 - no evidence of deep vein thrombosis, Baker's cyst   Pneumonia    PTSD (post-traumatic stress disorder)    Sickle cell trait (HCC)    Tobacco abuse    Tooth caries    Type II diabetes mellitus (HCC)  Past Surgical History:  Procedure Laterality Date   BLADDER SURGERY  1980   "TVT"   BLADDER SURGERY  2003   "TVT"   BREAST EXCISIONAL BIOPSY     BREAST SURGERY Right    I&D for multiple abscesses   CARDIAC CATHETERIZATION     CORONARY ARTERY BYPASS GRAFT N/A 06/30/2021   Procedure: CORONARY ARTERY BYPASS GRAFTING (CABG) x3 USING LEFT INTERNAL MAMMARY ARTERY,  RIGHT RADIAL ARTERY AND LEFT GREATER SAPHENOUS VEIN;  Surgeon: Corliss Skains, MD;  Location: [facility] OR;  Service: Open Heart Surgery;  Laterality: N/A;   cutting balloon      ENDOVEIN HARVEST OF GREATER SAPHENOUS VEIN Left 06/30/2021   Procedure: ENDOVEIN HARVEST OF GREATER SAPHENOUS VEIN;  Surgeon: Corliss Skains, MD;  Location: [facility] OR;  Service: Open Heart Surgery;  Laterality: Left;   EYE SURGERY Left    laser surgery   FINE NEEDLE ASPIRATION  08/21/2021   Procedure: FINE NEEDLE ASPIRATION (FNA) LINEAR;  Surgeon: Josephine Igo, DO;  Location: [facility] END[facility];  Service: Pulmonary;;   INCISE AND DRAIN ABCESS  ; 04/26/11; 08/13/11   right breast   INCISION AND DRAINAGE ABSCESS Right 05/09/2012   Procedure: INCISION AND DRAINAGE RIGHT BREAST ABSCESS;  Surgeon: Wilmon Arms. Corliss Skains, MD;  Location: [facility] OR;  Service: General;  Laterality: Right;   INCISION AND DRAINAGE ABSCESS Right 02/08/2014   Procedure: INCISION AND DRAINAGE RIGHT BREAST ABSCESS;  Surgeon: Manus Rudd, MD;  Location: [facility];  Service: General;  Laterality: Right;   INCISION AND DRAINAGE ABSCESS Right 06/14/2014   Procedure: INCISION AND DRAINAGE RIGHT BREAST ABSCESS;  Surgeon: Manus Rudd, MD;  Location: [facility];  Service: General;  Laterality: Right;   IR IMAGING GUIDED PORT INSERTION  12/08/2021   IRRIGATION AND DEBRIDEMENT ABSCESS  12/19/2010   Procedure: IRRIGATION AND DEBRIDEMENT ABSCESS;  Surgeon: Emelia Loron, MD;  Location: [facility];  Service: General;  Laterality: Right;   IRRIGATION AND DEBRIDEMENT ABSCESS  08/13/2011   Procedure: MINOR INCISION AND DRAINAGE OF ABSCESS;  Surgeon: Cherylynn Ridges, MD;  Location: [facility];  Service: General;  Laterality: Right;  Right Breast    IRRIGATION AND DEBRIDEMENT ABSCESS  11/17/2011   Procedure: IRRIGATION AND DEBRIDEMENT ABSCESS;  Surgeon: Wilmon Arms. Corliss Skains, MD;  Location: [facility];  Service: General;  Laterality: Right;  irrigation and debridement right recurrent breast abscess   LARYNX SURGERY     LEFT HEART CATH AND CORONARY ANGIOGRAPHY N/A 04/09/2021   Procedure: LEFT HEART CATH AND CORONARY ANGIOGRAPHY;  Surgeon: Corky Crafts, MD;  Location: [facility]  INVASIVE CV LAB;  Service: Cardiovascular;  Laterality: N/A;   LEFT HEART CATHETERIZATION WITH CORONARY ANGIOGRAM N/A 05/29/2012   Procedure: LEFT HEART CATHETERIZATION WITH CORONARY ANGIOGRAM & PTCA;  Surgeon: Lennette Bihari, MD;  Location: [facility] CATH LAB;  Service: Cardiovascular; Bifurcation dRCA-RPAD/PLA & rPDA  PTCA.   LEFT HEART CATHETERIZATION WITH CORONARY ANGIOGRAM N/A 11/08/2013   Procedure: LEFT HEART CATHETERIZATION WITH CORONARY ANGIOGRAM and Coronary Stent Intervention;  Surgeon: Marykay Lex, MD;  Location: [facility] CATH LAB;  Service: Cardiovascular; mLAD 80% (FFR 0.73) - resolute DES 3.0 x 22 mm (postdilated to 3.3 mm->3.5 mm)   NM MYOVIEW LTD  01/26/2009   Normal study, no evidence of ischemia, EF 67%   ployp removed from voice box 03/30/12     RADIAL ARTERY HARVEST Right 06/30/2021   Procedure: RADIAL ARTERY HARVEST;  Surgeon: Corliss Skains, MD;  [facility];  Service: Open Heart Surgery;  Laterality: Right;   REFRACTIVE SURGERY  ~ 2010   right   TEE WITHOUT CARDIOVERSION N/A 06/30/2021   Procedure: TRANSESOPHAGEAL ECHOCARDIOGRAM (TEE);  Surgeon: Corliss Skains, MD;  Location: [facility] OR;  Service: Open Heart Surgery;  Laterality: N/A;   TONSILLECTOMY  1988   VIDEO BRONCHOSCOPY WITH ENDOBRONCHIAL ULTRASOUND Bilateral 08/21/2021   Procedure: VIDEO BRONCHOSCOPY WITH ENDOBRONCHIAL ULTRASOUND;  Surgeon: Josephine Igo, DO;  Location: [facility];  Service: Pulmonary;  Laterality: Bilateral;     reports that she has quit smoking. Her smoking use included cigarettes. She started smoking about 43 years ago. She has a 21.9 pack-year smoking history. She has never used smokeless tobacco. She reports that she does not currently use drugs. She reports that she does not drink alcohol.  Allergies  Allergen Reactions   Amoxil [Amoxicillin] Hives    Has patient had a PCN reaction causing immediate rash, facial/tongue/throat swelling, SOB or lightheadedness with hypotension: Yes Has  patient had a PCN reaction causing severe rash involving mucus membranes or skin necrosis: No Has patient had a PCN reaction that required hospitalization No Has patient had a PCN reaction occurring within the last 10 years: Yes If all of the above answers are "NO", then may proceed with Cephalosporin use.   Chantix [Varenicline] Other (See Comments)    Nightmares Hallucinations     Suboxone [Buprenorphine Hcl-Naloxone Hcl] Nausea And Vomiting   Emend [Fosaprepitant Dimeglumine] Itching    See notes for transfusion on 8/23. Pt reported new onset of itching after Emend infusion was started. 25mg  Benadryl IV given per MD. Pt tolerated remaining infusion well.     Family History  Problem Relation Age of Onset   Esophageal cancer Mother    COPD Mother    Cancer Mother        lymphoma   Heart disease Mother    Stomach cancer Mother    Hyperlipidemia Father    Heart disease Father    Cancer Maternal Aunt        breast, colon   Crohn's disease Maternal Aunt    Hypertension Maternal Grandmother    Diabetes Maternal Grandmother    Hypertension Brother    Rectal cancer Neg Hx    Liver cancer Neg Hx    Colon cancer Neg Hx    Breast cancer Neg Hx     Prior to Admission medications   Medication Sig Start Date End Date Taking? Authorizing Provider  aspirin EC 81 MG tablet Take 81 mg by mouth daily.   Yes [provider]  atorvastatin (LIPITOR) 40 MG tablet TAKE 1 TABLET (40 MG TOTAL) BY MOUTH DAILY. 04/23/22  Yes Espinoza, Myrlene Broker, DO  busPIRone (BUSPAR) 15 MG tablet Take 0.5 tablets (7.5 mg total) by mouth 2 (two) times daily. 05/20/22  Yes [name], DO  Carboxymethylcellul-Glycerin (LUBRICATING EYE DROPS OP) Place 1 drop into both eyes daily as needed (dry eyes).   Yes [provider]  carvedilol (COREG) 12.5 MG tablet TAKE 1 TABLET (12.5 MG TOTAL) BY MOUTH 2 (TWO) TIMES DAILY WITH A MEAL. Patient taking differently: Take 12.5 mg by mouth 2 (two) times daily  as needed (high blood pressure). 10/28/21  Yes O'Neal, Ronnald Ramp, MD  diclofenac Sodium (VOLTAREN) 1 % GEL APPLY 1 APPLICATION TOPICALLY 3 TIMES DAILY AS NEEDED FOR PAIN 07/03/22  Yes O'Neal, Ronnald Ramp, MD  DULoxetine (CYMBALTA) 30 MG capsule Take 1 capsule (30 mg total) by mouth daily. 06/23/22  Yes Antony Madura, MD  ferrous  sulfate 325 (65 FE) MG tablet Take 325 mg by mouth daily with breakfast.   Yes [provider]  fluticasone (FLONASE) 50 MCG/ACT nasal spray PLACE 1 SPRAY INTO BOTH NOSTRILS DAILY AS NEEDED (CONGESTION). 05/13/21  Yes Espinoza, Myrlene Broker, DO  fluticasone-salmeterol (ADVAIR) 100-50 MCG/ACT AEPB Inhale 1 puff into the lungs 2 (two) times daily. 11/23/21  Yes [name], DO  folic acid (FOLVITE) 1 MG tablet Take 1 tablet (1 mg total) by mouth daily. 03/31/22  Yes Si Gaul, MD  furosemide (LASIX) 40 MG tablet Take 1 tablet (40 mg total) by mouth daily as needed. If you gain 3 lbs in 24 hours or 5 lbs in 1 week 07/17/21  Yes Barrett, Erin R, PA-C  hyoscyamine (LEVSIN SL) 0.125 MG SL tablet Place 1 tablet (0.125 mg total) under the tongue every 6 (six) hours as needed. For abdominal cramping 03/10/22  Yes Heilingoetter, Cassandra L, PA-C  insulin glargine (LANTUS) 100 UNIT/ML Solostar Pen Inject 24 Units into the skin every morning. 05/03/22  Yes Espinoza, Alejandra, DO  ipratropium (ATROVENT HFA) 17 MCG/ACT inhaler Inhale 2 puffs into the lungs every 4 (four) hours as needed for wheezing (COPD).   Yes [provider]  lactulose (CHRONULAC) 10 GM/15ML solution Take 30 mLs (20 g total) by mouth 2 (two) times daily as needed for mild constipation, moderate constipation or severe constipation. 03/10/22  Yes Pickenpack-Cousar, Arty Baumgartner, NP  lidocaine-prilocaine (EMLA) cream APPLY 1 APPLICATION TOPICALLY AS NEEDED. 02/01/22  Yes Heilingoetter, Cassandra L, PA-C  LORazepam (ATIVAN) 0.5 MG tablet Take 1 tablet (0.5 mg total) by mouth every 8 (eight) hours as  needed for anxiety (nausea). 03/10/22  Yes Pickenpack-Cousar, Arty Baumgartner, NP  megestrol (MEGACE) 20 MG tablet Take 1 tablet (20 mg total) by mouth 2 (two) times daily. 08/26/22  Yes Pickenpack-Cousar, Arty Baumgartner, NP  nicotine polacrilex (NICORETTE) 2 MG gum Take 1 each (2 mg total) by mouth as needed for smoking cessation. 06/03/21  Yes [name], DO  nitroGLYCERIN (NITROSTAT) 0.4 MG SL tablet Place 1 tablet (0.4 mg total) under the tongue every 5 (five) minutes as needed for chest pain. 04/24/20  Yes Espinoza, Alejandra, DO  ondansetron (ZOFRAN-ODT) 8 MG disintegrating tablet Take 1 tablet (8 mg total) by mouth every 8 (eight) hours as needed for nausea or vomiting. 01/28/22  Yes Heilingoetter, Cassandra L, PA-C  oxyCODONE 10 MG TABS Take 1 tablet (10 mg total) by mouth every 4 (four) hours as needed for severe pain. 08/26/22  Yes Pickenpack-Cousar, Arty Baumgartner, NP  oxyCODONE ER (XTAMPZA ER) 13.5 MG C12A Take 13.5 mg by mouth every 12 (twelve) hours. 09/14/22  Yes Pickenpack-Cousar, Arty Baumgartner, NP  pantoprazole (PROTONIX) 40 MG tablet Take 1 tablet (40 mg total) by mouth 2 (two) times daily. 06/23/22  Yes Zehr, Shanda Bumps D, PA-C  polyethylene glycol powder (GAVILAX) 17 GM/SCOOP powder TAKE ONE CAPFUL (17 GRAMS )BY MOUTH DAILY AS NEEDED FOR MILD OR MODERATE CONSTIPATION. 04/13/22  Yes Espinoza, Alejandra, DO  potassium chloride SA (KLOR-CON M) 20 MEQ tablet Take 1 tablet (20 mEq total) by mouth daily as needed. On days you take Lasix 07/17/21  Yes Barrett, Erin R, PA-C  prochlorperazine (COMPAZINE) 10 MG tablet TAKE 1 TABLET (10 MG TOTAL) BY MOUTH EVERY 6 (SIX) HOURS AS NEEDED FOR NAUSEA OR VOMITING. 05/12/22  Yes Heilingoetter, Cassandra L, PA-C  valACYclovir (VALTREX) 1000 MG tablet Take for 3 days prn each outbreak. Patient taking differently: Take 1,000 mg by mouth daily. Take for 3  days prn each outbreak. 07/08/22  Yes Brock Bad, MD  doxycycline (VIBRA-TABS) 100 MG tablet Take 1 tablet (100 mg total) by  mouth 2 (two) times daily. Patient not taking: Reported on 09/28/2022 08/25/22   Heilingoetter, Cassandra L, PA-C  glucose blood (ACCU-CHEK AVIVA PLUS) test strip Use as instructed 02/02/22   [name], DO  Insulin Pen Needle 32G X 4 MM MISC 1 Needle by Does not apply route in the morning and at bedtime. 02/02/22   [name], DO  metoCLOPramide (REGLAN) 10 MG tablet Take 1 tablet (10 mg total) by mouth 3 (three) times daily before meals. Patient not taking: Reported on 09/28/2022 05/12/22   Glee Arvin, NP    Physical Exam: Vitals:   09/28/22 1931 09/28/22 2217 09/28/22 2354 09/29/22 0121  BP: 128/83 (!) 146/81  (!) 122/91  Pulse: (!) 114 91  94  Resp: 18 19  15   Temp: 97.9 F (36.6 C)  98.6 F (37 C)   TempSrc: Oral  Oral   SpO2: 100% 100%  100%  Weight:      Height:        Physical Exam Vitals reviewed.  Constitutional:      General: She is not in acute distress. HENT:     Head: Normocephalic and atraumatic.  Eyes:     Extraocular Movements: Extraocular movements intact.  Cardiovascular:     Rate and Rhythm: Normal rate and regular rhythm.     Pulses: Normal pulses.     Heart sounds: Murmur heard.  Pulmonary:     Effort: Pulmonary effort is normal. No respiratory distress.     Breath sounds: Normal breath sounds. No wheezing or rales.  Abdominal:     General: Bowel sounds are normal.     Palpations: Abdomen is soft.     Tenderness: There is abdominal tenderness. There is guarding.     Comments: Generalized tenderness to palpation with guarding  Musculoskeletal:     Cervical back: Normal range of motion.     Right lower leg: Edema present.     Left lower leg: Edema present.     Comments: 2+ pitting edema of bilateral lower extremities  Skin:    General: Skin is warm and dry.  Neurological:     General: No focal deficit present.     Mental Status: She is alert and oriented to person, place, and time.     Labs on Admission: I have  personally reviewed following labs and imaging studies  CBC: Recent Labs  Lab 09/28/22 2032  WBC 5.9  HGB 9.5*  HCT 28.0*  MCV 104.9*  PLT 161   Basic Metabolic Panel: Recent Labs  Lab 09/28/22 2032 09/28/22 2243  NA 135  --   K 3.2*  --   CL 100  --   CO2 25  --   GLUCOSE 181*  --   BUN 12  --   CREATININE 0.89  --   CALCIUM 8.8*  --   MG  --  1.6*   GFR: Estimated Creatinine Clearance: 74.2 mL/min (by C-G formula based on SCr of 0.89 mg/dL). Liver Function Tests: No results for input(s): "AST", "ALT", "ALKPHOS", "BILITOT", "PROT", "ALBUMIN" in the last 168 hours. No results for input(s): "LIPASE", "AMYLASE" in the last 168 hours. No results for input(s): "AMMONIA" in the last 168 hours. Coagulation Profile: No results for input(s): "INR", "PROTIME" in the last 168 hours. Cardiac Enzymes: No results for input(s): "CKTOTAL", "CKMB", "CKMBINDEX", "TROPONINI" in  the last 168 hours. BNP (last 3 results) No results for input(s): "PROBNP" in the last 8760 hours. HbA1C: No results for input(s): "HGBA1C" in the last 72 hours. CBG: No results for input(s): "GLUCAP" in the last 168 hours. Lipid Profile: No results for input(s): "CHOL", "HDL", "LDLCALC", "TRIG", "CHOLHDL", "LDLDIRECT" in the last 72 hours. Thyroid Function Tests: No results for input(s): "TSH", "T4TOTAL", "FREET4", "T3FREE", "THYROIDAB" in the last 72 hours. Anemia Panel: No results for input(s): "VITAMINB12", "FOLATE", "FERRITIN", "TIBC", "IRON", "RETICCTPCT" in the last 72 hours. Urine analysis:    Component Value Date/Time   COLORURINE YELLOW 08/04/2022 0927   APPEARANCEUR CLEAR 08/04/2022 0927   LABSPEC 1.006 08/04/2022 0927   PHURINE 5.0 08/04/2022 0927   GLUCOSEU NEGATIVE 08/04/2022 0927   GLUCOSEU NEGATIVE 05/08/2015 1205   HGBUR NEGATIVE 08/04/2022 0927   HGBUR negative 11/24/2006 1453   BILIRUBINUR NEGATIVE 08/04/2022 0927   BILIRUBINUR negative 09/07/2021 1525   BILIRUBINUR NEGATIVE  01/18/2020 1036   KETONESUR NEGATIVE 08/04/2022 0927   PROTEINUR NEGATIVE 08/04/2022 0927   UROBILINOGEN 0.2 09/07/2021 1525   UROBILINOGEN 0.2 05/08/2015 1205   NITRITE NEGATIVE 08/04/2022 0927   LEUKOCYTESUR NEGATIVE 08/04/2022 1610    Radiological Exams on Admission: CT Angio Chest PE W and/or Wo Contrast  Result Date: 09/28/2022 CLINICAL DATA:  Chest pain and cardiac palpitations for 1 week, history of non-small cell lung carcinoma, initial encounter EXAM: CT ANGIOGRAPHY CHEST WITH CONTRAST TECHNIQUE: Multidetector CT imaging of the chest was performed using the standard protocol during bolus administration of intravenous contrast. Multiplanar CT image reconstructions and MIPs were obtained to evaluate the vascular anatomy. RADIATION DOSE REDUCTION: This exam was performed according to the departmental dose-optimization program which includes automated exposure control, adjustment of the mA and/or kV according to patient size and/or use of iterative reconstruction technique. CONTRAST:  80mL OMNIPAQUE IOHEXOL 350 MG/ML SOLN COMPARISON:  Chest x-ray from earlier in the same FINDINGS: Cardiovascular: No aneurysmal dilatation of the thoracic aorta is noted. Changes of prior coronary bypass grafting are identified. The pulmonary artery shows a normal branching pattern bilaterally. No filling defect to suggest pulmonary embolism is noted. Diffuse coronary calcifications are noted. Left chest wall port is noted. Mediastinum/Nodes: Thoracic inlet is within normal limits. The esophagus as visualized is within normal limits. No hilar or mediastinal adenopathy is noted. Lungs/Pleura: Small left pleural effusion is noted. A 13 mm somewhat spiculated lesion is noted in the right upper lobe. This has decreased in size from the prior exam at which time it measured up to 15 mm. No new parenchymal nodules are seen. No pneumothorax is noted. Upper Abdomen: Visualized upper abdomen demonstrates mild ascites and mild  changes of anasarca. No other focal abnormality is noted. Musculoskeletal: No acute rib abnormality is noted. No compression deformity is seen. Review of the MIP images confirms the above findings. IMPRESSION: No evidence of pulmonary emboli. 13 mm spiculated lesion in the right upper lobe decreased in size from the prior exam at which time it measured 15 mm. Small left pleural effusion. Electronically Signed   By: Alcide Clever M.D.   On: 09/28/2022 22:36   DG Chest 2 View  Result Date: 09/28/2022 CLINICAL DATA:  History of non-small cell carcinoma with chest pain, initial encounter EXAM: CHEST - 2 VIEW COMPARISON:  08/18/2021, chest CT from 08/02/2022 FINDINGS: Cardiac shadow is within normal limits. Postsurgical changes are again seen. Left chest wall port is noted. Lungs are well aerated bilaterally. Previously seen right upper lobe nodule is  not well appreciated on today's exam. IMPRESSION: No acute abnormality noted. Known right upper lobe nodule is not well appreciated on today's exam. Electronically Signed   By: Alcide Clever M.D.   On: 09/28/2022 19:35    Assessment and Plan  Chest pain History of CAD status post CABG and PCI CT PE study negative.  EKG without acute ischemic changes.  Troponin 183> 118>192.  Not having active chest pain at this time.  She was given aspirin in the ED.  Continue cardiac monitoring, IV heparin, and trend troponin.  Echocardiogram ordered.  Cardiology will consult in the morning.  Generalized abdominal pain, nausea, vomiting Stat CT abdomen pelvis ordered.  Continue antiemetic as needed.  Mild hypokalemia and hypomagnesemia Likely due to vomiting/poor p.o. intake.  Continue to monitor electrolytes and replace as needed.  Stage IV non-small cell lung cancer Currently on chemotherapy.  Outpatient oncology follow-up.  Small left pleural effusion Seen on CT.  Possibly related to malignancy.  Check BNP.  Anemia In the setting of cancer treatment/chemotherapy.   Hemoglobin is stable, continue to monitor labs.  COPD Stable, no signs of acute exacerbation.  Continue home inhaler.  GERD IV Protonix 40 mg daily.  Hypertension Stable.  Resume home medication when she is able to tolerate p.o. intake.  Hyperlipidemia Resume statin when she is able to tolerate p.o. intake.  Insulin-dependent type 2 diabetes A1c 6.8 in June 2023, repeat ordered.  Glucose 181.  Continue home long-acting insulin at 1/2 of usual dose given vomiting/poor p.o. intake.  Semglee 12 units daily ordered.  Sensitive sliding scale insulin every 4 hours.  DVT prophylaxis: IV heparin gtt Code Status: Full Code (discussed with the patient) Level of care: Progressive Care Unit Admission status: It is my clinical opinion that referral for OBSERVATION is reasonable and necessary in this patient based on the above information provided. The aforementioned taken together are felt to place the patient at high risk for further clinical deterioration. However, it is anticipated that the patient may be medically stable for discharge from the hospital within 24 to 48 hours.  John Giovanni MD [facility]  If 7PM-7AM, please contact night-coverage [email or web address]  09/29/2022, 2:42 AM

## 2022-09-29 NOTE — Progress Notes (Addendum)
ANTICOAGULATION CONSULT NOTE - Follow Up Consult  Pharmacy Consult for Heparin Indication: chest pain/ACS  Allergies  Allergen Reactions   Amoxil [Amoxicillin] Hives    Has patient had a PCN reaction causing immediate rash, facial/tongue/throat swelling, SOB or lightheadedness with hypotension: Yes Has patient had a PCN reaction causing severe rash involving mucus membranes or skin necrosis: No Has patient had a PCN reaction that required hospitalization No Has patient had a PCN reaction occurring within the last 10 years: Yes If all of the above answers are "NO", then may proceed with Cephalosporin use.   Chantix [Varenicline] Other (See Comments)    Nightmares Hallucinations     Suboxone [Buprenorphine Hcl-Naloxone Hcl] Nausea And Vomiting   Emend [Fosaprepitant Dimeglumine] Itching    See notes for transfusion on 8/23. Pt reported new onset of itching after Emend infusion was started. 25mg  Benadryl IV given per MD. Pt tolerated remaining infusion well.     Patient Measurements: Height: 5\' 7"  (170.2 cm) Weight: 78 kg (172 lb) IBW/kg (Calculated) : 61.6 Heparin Dosing Weight: 77 kg  Vital Signs: Temp: 98.4 F (36.9 C) (09/18 0757) Temp Source: Oral (09/18 0757) BP: 119/85 (09/18 0700) Pulse Rate: 91 (09/18 0700)  Labs: Recent Labs    09/28/22 2032 09/29/22 0121 09/29/22 0231 09/29/22 0613  HGB 9.5*  --   --   --   HCT 28.0*  --   --   --   PLT 161  --   --   --   HEPARINUNFRC  --  <0.10*  --   --   CREATININE 0.89  --   --  0.85  TROPONINIHS 118*  --  192* 173*    Estimated Creatinine Clearance: 77.7 mL/min (by C-G formula based on SCr of 0.85 mg/dL).   Medications:  Infusions:   heparin 900 Units/hr (09/29/22 0540)    Assessment: 57 y.o. female with a past history significant for triple bypass surgery, lung cancer, diabetes, hypertension presents today for evaluation of chest pain.  Symptom has been going on for about a week.  She also report palpitation  and shortness of breath on exertion. Pharmacy to dose heparin drip for ACS/STEMI.  No prior AC noted.  Troponin: 183, 118, 192, 173  Today, 09/29/2022:  Heparin level  0.23, low on heparin at 900 unit/hr CBC: Hgb low and decreased to 8 (baseline 8.3-10.5) and Plt WNL but decreased to 160k No bleeding, complications, or IV issues reported by RN   Goal of Therapy:  Heparin level 0.3-0.7 units/ml Monitor platelets by anticoagulation protocol: Yes   Plan:  Give heparin 2000 units bolus IV x 1 Increase to heparin IV infusion at 1050 units/hr Heparin level 6 hours after rate change Daily heparin level and CBC Follow up results from Lexiscan Myoview stress test today.     Lynann Beaver PharmD, BCPS [facility] main pharmacy [phone] 09/29/2022 9:44 AM     Addendum:  09/29/2022, 5:24 PM  Patient transferred to [facility] temporarily for a stress test and has now returned to [facility].   The previously entered bolus and rate increase were not completed.   RN reports that the heparin was stopped for the procedure, then restarted at the previous rate of 900 unit/hr.  No bleeding, complications, or IV issues reported.    Plan:  RN will bolus and rate increase at this time. Re-time heparin level for 6 hours after rate change.   Lynann Beaver PharmD, BCPS [facility] pharmacy [phone] 09/29/2022 5:24 PM

## 2022-09-29 NOTE — Progress Notes (Signed)
   [name] presented for a nuclear stress test today.  No immediate complications.  Stress imaging is pending at this time.  Preliminary EKG findings may be listed in the chart, but the stress test result will not be finalized until perfusion imaging is complete.  Zoiee Kalinowski Shontia Gillooly, PA-C 09/29/2022, 1:49 PM

## 2022-09-29 NOTE — Consult Note (Signed)
Cardiology Consultation   Patient ID: Cassidy [name] MRN: [id number]; DOB: 22-Apr-1965  Admit date: 09/28/2022 Date of Consult: 09/29/2022  PCP:  [name], MD   [facility] Providers Cardiologist:  Reatha Harps, MD   {  Patient Profile:   Cassidy [name] is a 57 y.o. 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 Marland Mcalpine.  History of Present Illness:   Cassidy [name] has history of PCI in 2015 with eventual CABG in 2023.  She is followed by Dr. Bufford Buttner and last seen December 2023 and had been doing good without any cardiac complaints at that time.  Overall her coronary artery disease seems to be stable as she has no interference with ADLs and denies any chest pain or shortness of breath prior to today's admission.  Currently, patient is being admitted for acute onset chest pain that started roughly around Sunday.  Patient has stage IV lung cancer and undergoes chemo and has been doing this since June of last year.  She states that repetitively she will have symptoms after a chemotherapy session generally 3 to 4 days after that.  These generally are constitutional symptoms with fatigue, nausea, vomiting, abdominal pain.  She notes an episode that occurred recently with the same symptoms of vomiting and abdominal pain but this time was associated with more fatigue, 2 episodes of chest pain, and palpitations.  She states that this chest pain was on the right side of her chest however when examining her she started stating that it was midsternal, focal, tender, reproducible upon palpation.  She denied any radiating pain.  It was difficult for her to explain what the sensation felt like however she states that it likely was a squeezing or tightness.  Prior to this she denied any chest pain or shortness of breath.  She does well and ambulates at  home without difficulty.  She is not currently having any chest pain.  She has been given potassium supplementation and IV magnesium and started on heparin and aspirin.  On admission she was noted to be tachycardic.  CTA of the chest was negative for PE.  It did indicate small to moderate volume ascites with mild mesenteric edema.  Biliary sludge without acute cholecystitis.  Aortic atherosclerosis with two-vessel coronary disease. EKG without any acute ST-T wave changes.  Troponins [phone] BNP 112.   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  s/p PCTA to dRCA and ostial RPAV-PLA vessel + PDA branch (05/2012)  b. [place] s/p DES to mLAD - Resolute DES 3.0 x 22 (3.42mm -->3.3 mm)   Bipolar depression (HCC)    Breast abscess    a. right side.    COPD (chronic obstructive pulmonary disease) (HCC)    Depression    Diabetic peripheral neuropathy (HCC)    Fibromyalgia    Genital warts    GERD (gastroesophageal reflux disease)    History of hiatal hernia    HLD (hyperlipidemia)    Hypertension    Lung cancer (HCC)    Pain in limb    a. LE VENOUS DUPLEX, 02/05/2009 - no evidence  of deep vein thrombosis, Baker's cyst   Pneumonia    PTSD (post-traumatic stress disorder)    Sickle cell trait (HCC)    Tobacco abuse    Tooth caries    Type II diabetes mellitus (HCC)     Past Surgical History:  Procedure Laterality Date   BLADDER SURGERY  1980   "TVT"   BLADDER SURGERY  2003   "TVT"   BREAST EXCISIONAL BIOPSY     BREAST SURGERY Right    I&D for multiple abscesses   CARDIAC CATHETERIZATION     CORONARY ARTERY BYPASS GRAFT N/A 06/30/2021   Procedure: CORONARY ARTERY BYPASS GRAFTING (CABG) x3 USING LEFT INTERNAL MAMMARY ARTERY,  RIGHT RADIAL ARTERY AND LEFT GREATER SAPHENOUS VEIN;  Surgeon:  Corliss Skains, MD;  Location: [facility] OR;  Service: Open Heart Surgery;  Laterality: N/A;   cutting balloon     ENDOVEIN HARVEST OF GREATER SAPHENOUS VEIN Left 06/30/2021   Procedure: ENDOVEIN HARVEST OF GREATER SAPHENOUS VEIN;  Surgeon: Corliss Skains, MD;  Location: [facility] OR;  Service: Open Heart Surgery;  Laterality: Left;   EYE SURGERY Left    laser surgery   FINE NEEDLE ASPIRATION  08/21/2021   Procedure: FINE NEEDLE ASPIRATION (FNA) LINEAR;  Surgeon: Josephine Igo, DO;  Location: [facility] ENDOS[facility];  Service: Pulmonary;;   INCISE AND DRAIN ABCESS  ; 04/26/11; 08/13/11   right breast   INCISION AND DRAINAGE ABSCESS Right 05/09/2012   Procedure: INCISION AND DRAINAGE RIGHT BREAST ABSCESS;  Surgeon: Wilmon Arms. Corliss Skains, MD;  Location: [facility] OR;  Service: General;  Laterality: Right;   INCISION AND DRAINAGE ABSCESS Right 02/08/2014   Procedure: INCISION AND DRAINAGE RIGHT BREAST ABSCESS;  Surgeon: Manus Rudd, MD;  Location: [facility];  Service: General;  Laterality: Right;   INCISION AND DRAINAGE ABSCESS Right 06/14/2014   Procedure: INCISION AND DRAINAGE RIGHT BREAST ABSCESS;  Surgeon: Manus Rudd, MD;  Location: [facility];  Service: General;  Laterality: Right;   IR IMAGING GUIDED PORT INSERTION  12/08/2021   IRRIGATION AND DEBRIDEMENT ABSCESS  12/19/2010   Procedure: IRRIGATION AND DEBRIDEMENT ABSCESS;  Surgeon: Emelia Loron, MD;  Location: [facility] OR;  Service: General;  Laterality: Right;   IRRIGATION AND DEBRIDEMENT ABSCESS  08/13/2011   Procedure: MINOR INCISION AND DRAINAGE OF ABSCESS;  Surgeon: Cherylynn Ridges, MD;  Location: [facility];  Service: General;  Laterality: Right;  Right Breast    IRRIGATION AND DEBRIDEMENT ABSCESS  11/17/2011   Procedure: IRRIGATION AND DEBRIDEMENT ABSCESS;  Surgeon: Wilmon Arms. Corliss Skains, MD;  Location: [facility];  Service: General;  Laterality: Right;  irrigation and debridement right recurrent breast abscess   LARYNX SURGERY     LEFT HEART CATH AND CORONARY ANGIOGRAPHY N/A  04/09/2021   Procedure: LEFT HEART CATH AND CORONARY ANGIOGRAPHY;  Surgeon: Corky Crafts, MD;  [facility] INVASIVE CV LAB;  Service: Cardiovascular;  Laterality: N/A;   LEFT HEART CATHETERIZATION WITH CORONARY ANGIOGRAM N/A 05/29/2012   Procedure: LEFT HEART CATHETERIZATION WITH CORONARY ANGIOGRAM & PTCA;  Surgeon: Lennette Bihari, MD;  Location: [facility] CATH LAB;  Service: Cardiovascular; Bifurcation dRCA-RPAD/PLA & rPDA  PTCA.   LEFT HEART CATHETERIZATION WITH CORONARY ANGIOGRAM N/A 11/08/2013   Procedure: LEFT HEART CATHETERIZATION WITH CORONARY ANGIOGRAM and Coronary Stent Intervention;  Surgeon: Marykay Lex, MD;  Location: [facility] CATH LAB;  Service: Cardiovascular; mLAD 80% (FFR 0.73) - resolute DES 3.0 x 22 mm (postdilated to 3.3 mm->3.5 mm)   NM MYOVIEW LTD  01/26/2009   Normal  study, no evidence of ischemia, EF 67%   ployp removed from voice box 03/30/12     RADIAL ARTERY HARVEST Right 06/30/2021   Procedure: RADIAL ARTERY HARVEST;  Surgeon: Corliss Skains, MD;  Location: [facility];  Service: Open Heart Surgery;  Laterality: Right;   REFRACTIVE SURGERY  ~ 2010   right   TEE WITHOUT CARDIOVERSION N/A 06/30/2021   Procedure: TRANSESOPHAGEAL ECHOCARDIOGRAM (TEE);  Surgeon: Corliss Skains, MD;  Location: [facility] OR;  Service: Open Heart Surgery;  Laterality: N/A;   TONSILLECTOMY  1988   VIDEO BRONCHOSCOPY WITH ENDOBRONCHIAL ULTRASOUND Bilateral 08/21/2021   Procedure: VIDEO BRONCHOSCOPY WITH ENDOBRONCHIAL ULTRASOUND;  Surgeon: Josephine Igo, DO;  Location: [facility];  Service: Pulmonary;  Laterality: Bilateral;     Inpatient Medications: Scheduled Meds:  insulin aspart  0-9 Units Subcutaneous Q4H   insulin glargine-yfgn  12 Units Subcutaneous Daily   mometasone-formoterol  2 puff Inhalation BID   pantoprazole (PROTONIX) IV  40 mg Intravenous Q24H   Continuous Infusions:  heparin 900 Units/hr (09/29/22 0540)   PRN Meds: fluticasone, morphine injection, nitroGLYCERIN,  ondansetron (ZOFRAN) IV, oxyCODONE-acetaminophen  Allergies:    Allergies  Allergen Reactions   Amoxil [Amoxicillin] Hives    Has patient had a PCN reaction causing immediate rash, facial/tongue/throat swelling, SOB or lightheadedness with hypotension: Yes Has patient had a PCN reaction causing severe rash involving mucus membranes or skin necrosis: No Has patient had a PCN reaction that required hospitalization No Has patient had a PCN reaction occurring within the last 10 years: Yes If all of the above answers are "NO", then may proceed with Cephalosporin use.   Chantix [Varenicline] Other (See Comments)    Nightmares Hallucinations     Suboxone [Buprenorphine Hcl-Naloxone Hcl] Nausea And Vomiting   Emend [Fosaprepitant Dimeglumine] Itching    See notes for transfusion on 8/23. Pt reported new onset of itching after Emend infusion was started. 25mg  Benadryl IV given per MD. Pt tolerated remaining infusion well.     Social History:   Social History   Socioeconomic History   Marital status: Single    Spouse name: Not on file   Number of children: 1   Years of education: Not on file   Highest education level: Not on file  Occupational History   Occupation: homemaker   Occupation: disabled  Tobacco Use   Smoking status: Former    Current packs/day: 0.50    Average packs/day: 0.5 packs/day for 43.7 years (21.9 ttl pk-yrs)    Types: Cigarettes    Start date: 01/12/1979   Smokeless tobacco: Never   Tobacco comments:    08/14/11 "quit for 3 wk 05/2011; was smoking 1 ppd since age 78 before I quit; at least I've cut down to 1/4 ppd",  Smoking 3 per day 08/20/2021. 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   Social Determinants of Health   Financial Resource Strain: High Risk (09/08/2021)   Overall Financial Resource Strain (CARDIA)    Difficulty of Paying Living Expenses: Very hard  Food Insecurity: Not on file  Transportation Needs: Not on file  Physical Activity: Not on file  Stress: Not on file  Social Connections: Not on file  Intimate Partner Violence: Not At Risk (09/29/2021)   Humiliation, Afraid, Rape, and Kick questionnaire    Fear of Current or Ex-Partner: No    Emotionally Abused: No    Physically Abused: No    Sexually Abused: No    Family History:    Family History  Problem Relation Age of Onset   Esophageal cancer Mother    COPD Mother    Cancer Mother        lymphoma   Heart disease Mother    Stomach cancer Mother    Hyperlipidemia Father    Heart disease Father    Cancer Maternal Aunt        breast, colon   Crohn's disease Maternal Aunt    Hypertension Maternal Grandmother    Diabetes Maternal Grandmother    Hypertension Brother    Rectal cancer Neg Hx    Liver cancer Neg Hx    Colon cancer Neg Hx    Breast cancer Neg Hx      ROS:  Please see the history of present illness.   All other ROS reviewed and negative.     Physical Exam/Data:   Vitals:   09/29/22 0300 09/29/22 0400 09/29/22 0700 09/29/22 0757  BP: 102/68 122/76 119/85   Pulse: 85 89 91   Resp: 16 17 15    Temp:  98.6 F (37 C)  98.4 F (36.9 C)  TempSrc:  Oral  Oral  SpO2: 97% 99% 98%   Weight:      Height:        Intake/Output Summary (Last 24 hours) at 09/29/2022 0830 Last data filed at 09/29/2022 0231 Gross  per 24 hour  Intake 41.43 ml  Output --  Net 41.43 ml      09/28/2022    7:30 PM 09/28/2022    6:14 PM 09/15/2022   11:20 AM  Last 3 Weights  Weight (lbs) 172 lb 172 lb 176 lb 1.6 oz  Weight (kg) 78.019 kg 78.019 kg 79.878 kg     Body mass index is 26.94 kg/m.  General:  Well nourished, well developed, in no acute distress HEENT: normal Neck: no JVD Vascular: No carotid bruits; Distal pulses 2+ bilaterally Cardiac:  normal S1, S2; RRR; no murmur  Lungs:  clear to auscultation bilaterally, no wheezing, rhonchi or rales  Abd: Right upper quadrant pain Ext: no edema Musculoskeletal: 2+ pitting edema Skin: warm and dry  Neuro:  CNs 2-12 intact, no focal abnormalities noted Psych:  Normal affect   EKG:  The EKG was personally reviewed and demonstrates: Sinus tachycardia heart rate 100.  No acute ST-T wave changes. Telemetry:  Telemetry was personally reviewed and demonstrates: Normal sinus rhythm.  Heart rates in the 90s to 100.  Relevant CV Studies: Echocardiogram 04/09/2021 1. Left ventricular ejection fraction, by estimation, is 60 to 65%. The  left ventricle has normal function. The left ventricle has no regional  wall motion abnormalities. There is mild left ventricular hypertrophy.  Left ventricular diastolic parameters  are consistent with Grade I diastolic dysfunction (impaired relaxation).  2. Right ventricular systolic function is normal. The right ventricular  size is normal.   3. The mitral valve is normal in structure. Trivial mitral valve  regurgitation. No evidence of mitral stenosis.   4. The aortic valve is tricuspid. Aortic valve regurgitation is not  visualized. No aortic stenosis is present.   5. The inferior vena cava is normal in size with greater than 50%  respiratory variability, suggesting right atrial pressure of 3 mmHg.   Laboratory Data:  High Sensitivity Troponin:   Recent Labs  Lab 09/28/22 2030 09/28/22 2032 09/29/22 0231 09/29/22 0613   TROPONINIHS 183* 118* 192* 173*     Chemistry Recent Labs  Lab 09/28/22 2032 09/28/22 2243 09/29/22 0613  NA 135  --  135  K 3.2*  --  3.6  CL 100  --  98  CO2 25  --  27  GLUCOSE 181*  --  158*  BUN 12  --  11  CREATININE 0.89  --  0.85  CALCIUM 8.8*  --  8.5*  MG  --  1.6* 1.9  GFRNONAA >60  --  >60  ANIONGAP 10  --  10    No results for input(s): "PROT", "ALBUMIN", "AST", "ALT", "ALKPHOS", "BILITOT" in the last 168 hours. Lipids No results for input(s): "CHOL", "TRIG", "HDL", "LABVLDL", "LDLCALC", "CHOLHDL" in the last 168 hours.  Hematology Recent Labs  Lab 09/28/22 2032  WBC 5.9  RBC 2.67*  HGB 9.5*  HCT 28.0*  MCV 104.9*  MCH 35.6*  MCHC 33.9  RDW 14.1  PLT 161   Thyroid No results for input(s): "TSH", "FREET4" in the last 168 hours.  BNP Recent Labs  Lab 09/29/22 0613  BNP 112.4*    DDimer  Recent Labs  Lab 09/28/22 2032  DDIMER 3.81*     Radiology/Studies:  CT ABDOMEN PELVIS WO CONTRAST  Result Date: 09/29/2022 CLINICAL DATA:  57 year old female with history of acute onset of nonlocalized abdominal pain with nausea and vomiting for the past 3 days and constipation. History of non-small cell lung cancer. * Tracking Code: BO * EXAM: CT ABDOMEN AND PELVIS WITHOUT CONTRAST TECHNIQUE: Multidetector CT imaging of the abdomen and pelvis was performed following the standard protocol without IV contrast. RADIATION DOSE REDUCTION: This exam was performed according to the departmental dose-optimization program which includes automated exposure control, adjustment of the mA and/or kV according to patient size and/or use of iterative reconstruction technique. COMPARISON:  CT of the abdomen and pelvis 08/02/2022. FINDINGS: Comment: Today's study is limited for detection and characterization of visceral and/or vascular lesions by lack of IV contrast. Lower chest: Small left and trace right pleural effusions lying dependently. Atherosclerotic calcifications in the  descending thoracic aorta as well as the left circumflex and right coronary arteries. Central venous catheter tip in the right atrium incidentally noted. Hepatobiliary: Low-attenuation lesion in the central aspect of segment 7 of the liver (axial image 15 of series 2), similar to prior studies estimated to measure approximately 2.6 x 1.6 cm on today's exam, incompletely characterized without IV contrast. No other definite new suspicious cystic or solid hepatic lesions are confidently identified on today's noncontrast CT examination. Amorphous intermediate attenuation material lying dependently within the gallbladder likely reflects biliary sludge and/or noncalcified gallstones. Gallbladder does not appear distended. Pancreas: No definite pancreatic mass or peripancreatic fluid collections or inflammatory changes are appreciated on today's noncontrast CT examination. Spleen: Unremarkable. Adrenals/Urinary Tract: High attenuation material within the collecting systems of both kidneys, ureters, and the  urinary bladder, related to recent contrast-enhanced chest CTA. Unenhanced appearance of the kidneys is otherwise unremarkable. 1.9 x 1.7 cm low-attenuation (-3 HU) right adrenal nodule (axial image 23 of series 2), similar to prior studies, compatible with an adenoma. Left adrenal gland is unremarkable in appearance. Urinary bladder is unremarkable in appearance. Stomach/Bowel: Unenhanced appearance of the stomach is unremarkable. No definite pathologic dilatation of small bowel or colon. Appendix is not confidently identified may be surgically absent. Vascular/Lymphatic: Atherosclerosis in the abdominal aorta and pelvic vasculature. No appreciable lymphadenopathy confidently identified in the abdomen or pelvis on today's noncontrast CT examination. Reproductive: Unenhanced appearance of the uterus and ovaries is unremarkable. Other: Small to moderate volume of low-intermediate attenuation (25 HU) ascites. No  pneumoperitoneum. Mild diffuse mesenteric edema. Musculoskeletal: Diffuse body wall edema. There are no aggressive appearing lytic or blastic lesions noted in the visualized portions of the skeleton. IMPRESSION: 1. Small to moderate volume of ascites, slightly increased compared to the prior examination. There is also mild mesenteric edema and diffuse body wall edema along with bilateral pleural effusions; imaging findings that suggest a state of anasarca. 2. Biliary sludge lying dependently within the gallbladder, without definite findings to suggest an acute cholecystitis. 3. Low-attenuation lesion in the central aspect of segment 7 of the liver grossly unchanged compared to prior studies, likely benign (potentially a cavernous hemangioma). 4. Aortic atherosclerosis, in addition to least 2 vessel coronary artery disease. Please note that although the presence of coronary artery calcium documents the presence of coronary artery disease, the severity of this disease and any potential stenosis cannot be assessed on this non-gated CT examination. Assessment for potential risk factor modification, dietary therapy or pharmacologic therapy may be warranted, if clinically indicated. 5. Small right adrenal adenoma, stable. 6. Additional incidental findings, as above. Electronically Signed   By: Trudie Reed M.D.   On: 09/29/2022 05:57   CT Angio Chest PE W and/or Wo Contrast  Result Date: 09/28/2022 CLINICAL DATA:  Chest pain and cardiac palpitations for 1 week, history of non-small cell lung carcinoma, initial encounter EXAM: CT ANGIOGRAPHY CHEST WITH CONTRAST TECHNIQUE: Multidetector CT imaging of the chest was performed using the standard protocol during bolus administration of intravenous contrast. Multiplanar CT image reconstructions and MIPs were obtained to evaluate the vascular anatomy. RADIATION DOSE REDUCTION: This exam was performed according to the departmental dose-optimization program which includes  automated exposure control, adjustment of the mA and/or kV according to patient size and/or use of iterative reconstruction technique. CONTRAST:  80mL OMNIPAQUE IOHEXOL 350 MG/ML SOLN COMPARISON:  Chest x-ray from earlier in the same FINDINGS: Cardiovascular: No aneurysmal dilatation of the thoracic aorta is noted. Changes of prior coronary bypass grafting are identified. The pulmonary artery shows a normal branching pattern bilaterally. No filling defect to suggest pulmonary embolism is noted. Diffuse coronary calcifications are noted. Left chest wall port is noted. Mediastinum/Nodes: Thoracic inlet is within normal limits. The esophagus as visualized is within normal limits. No hilar or mediastinal adenopathy is noted. Lungs/Pleura: Small left pleural effusion is noted. A 13 mm somewhat spiculated lesion is noted in the right upper lobe. This has decreased in size from the prior exam at which time it measured up to 15 mm. No new parenchymal nodules are seen. No pneumothorax is noted. Upper Abdomen: Visualized upper abdomen demonstrates mild ascites and mild changes of anasarca. No other focal abnormality is noted. Musculoskeletal: No acute rib abnormality is noted. No compression deformity is seen. Review of the MIP images confirms  the above findings. IMPRESSION: No evidence of pulmonary emboli. 13 mm spiculated lesion in the right upper lobe decreased in size from the prior exam at which time it measured 15 mm. Small left pleural effusion. Electronically Signed   By: Alcide Clever M.D.   On: 09/28/2022 22:36   DG Chest 2 View  Result Date: 09/28/2022 CLINICAL DATA:  History of non-small cell carcinoma with chest pain, initial encounter EXAM: CHEST - 2 VIEW COMPARISON:  08/18/2021, chest CT from 08/02/2022 FINDINGS: Cardiac shadow is within normal limits. Postsurgical changes are again seen. Left chest wall port is noted. Lungs are well aerated bilaterally. Previously seen right upper lobe nodule is not well  appreciated on today's exam. IMPRESSION: No acute abnormality noted. Known right upper lobe nodule is not well appreciated on today's exam. Electronically Signed   By: Alcide Clever M.D.   On: 09/28/2022 19:35     Assessment and Plan:   NSTEMI CAD status post CABG 2023 Patient recently underwent chemotherapy and has recurrent episode of nausea, vomiting, abdominal pain.  This episode is now accompanied with palpitations and focal reproducible nonexertional chest pain.  EKG without any acute ST-T wave changes.  Troponins that are relatively flat and mildly elevated. [phone].  There is lower suspicion for this to be related to ACS and more likely a result of her chemotherapy in the setting of severe nausea and vomiting.  However, discussed with MD and will plan for Myoview to identify potentially ischemic etiologies. Continue IV heparin, plan for Myoview today Continue aspirin, continue carvedilol 12.5 mg twice daily when able to tolerate p.o., atorvastatin 40 mg. Had previous normal echo in 2023  Stage IV lung cancer on chemo Abdominal pain with nausea and vomiting Anemia COPD     Risk Assessment/Risk Scores:   TIMI Risk Score for Unstable Angina or Non-ST Elevation MI:   The patient's TIMI risk score is 5, which indicates a 26% risk of all cause mortality, new or recurrent myocardial infarction or need for urgent revascularization in the next 14 days.{   For questions or updates, please contact [facility] Please consult [email or web address] for contact info under    Signed, Abagail Kitchens, PA-C  09/29/2022 8:30 AM

## 2022-09-29 NOTE — Progress Notes (Addendum)
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  On arrival to the ED she was tachycardic in the 110s however she was afebrile and labs showed no leukocytosis.  Basic labs were done and she was noted to be hypokalemic and hypomagnesemic and troponin was elevated as well as a D-dimer of 3.81.  CTA of the chest PE protocol was obtained an showed no evidence for PE but did show the 13 mm spiculated lesion in the right upper lobe which was decreased in size from prior exam from where it was 15 mm.  She also had a small left pleural effusion.  EKG was done and showed sinus tachycardia with no acute ischemic changes.  The ED PA discussed with on-call cardiology team recommended starting IV heparin and trending troponin.  Cardiology team felt there is no urgent need to transfer her to [facility].  She was admitted to [facility] and was given aspirin, Zofran, Percocet, oral potassium, IV magnesium and started on IV heparin.  The patient had described the chest discomfort and dyspnea on exertion worsening for the last week.  She describes a chest pain as being substernal/left-sided and described as a squeezing sensation but felt that the palpitations were the worst.  She continued with intermittent episodes over the week and felt very dyspneic and short of breath even walking small distances.  She reports chronic bilateral lower extremity  edema which is slightly worsened.  Also had some intermittent nausea and vomiting related to her cancer treatment for the past 3 days has been constantly vomiting and has not been able to take anything p.o.  Constipated and not had a bowel movement for 4 days.  Of note she reports generalized abdominal discomfort and a history of intra-abdominal cancer with metastasis which she is taking pain medications at home for.  Currently she had recurrent chest discomfort and palpitations with cardiology has evaluated and recommending pursuit for further workup despite their suspicion for ACS being on the lower side.  They feel that the course of action is to identify potential ischemic etiologies and obtaining a Myoview.  Echocardiogram has already been done and pending read.  Currently she is being admitted treated for the following but not limited to:  Assessment and Plan:  Chest pain History of CAD status post CABG and PCI -CTA PE study negative.   -EKG without acute ischemic changes.  Troponin 183> 118>192 and is now 173.   -Was having Chest discomfort this AM but Not having active chest pain at this time.   -She was given aspirin in the ED.   -Continue cardiac monitoring, IV heparin, and trend troponin.  -Echocardiogram ordered and done but pending read.   -Cardiology consulted and recommending Myoview    Generalized abdominal pain, nausea, vomiting -Stat CT abdomen pelvis ordered and done and showed "Small to moderate volume of ascites, slightly increased compared to the prior examination. There is also mild mesenteric edema and diffuse body wall edema along with  bilateral pleural effusions; imaging findings that suggest a state of anasarca. Biliary sludge lying dependently within the gallbladder, without definite findings to suggest an acute cholecystitis. Low-attenuation lesion in the central aspect of segment 7 of the liver grossly unchanged compared to prior studies, likely benign (potentially a  cavernous hemangioma).  Aortic atherosclerosis, in addition to least 2 vessel coronary artery disease. Please note that although the presence of coronary artery calcium documents the presence of coronary artery disease, the severity of this disease and any potential stenosis cannot be assessed on this non-gated CT examination. Assessment for potential risk factor modification, dietary therapy or pharmacologic therapy may be warranted, if clinically indicated. Small right adrenal adenoma, stable. Additional incidental findings, as above" -Continue antiemetic as needed.   Mild hypokalemia and hypomagnesemia -Likely due to vomiting/poor p.o. intake. -Electrolyte Trend: Recent Labs  Lab 09/15/22 1031 09/28/22 2032 09/28/22 2243 09/29/22 0613  K 3.6 3.2*  --  3.6  MG  --   --    < > 1.9   < > = values in this interval not displayed.  -Continue to Monitor and Trend and Replete as Necessary -Repeat CMP and Mag in the AM   Stage IV non-small cell lung cancer -Currently on chemotherapy.  Outpatient oncology follow-up and plans to discuss stopping Chemotherapy with her Primary Oncologist Dr. Bradd Canary left pleural effusion -Seen on CT.  Possibly related to malignancy.  Check BNP.   Macrocytic Anemia -In the setting of cancer treatment/chemotherapy.  -Hgb/Hct Trend: Recent Labs  Lab 09/15/22 1031 09/28/22 2032  HGB 9.4* 9.5*  HCT 27.2* 28.0*  MCV 106.7* 104.9*  -Hemoglobin is stable, continue to monitor labs. -Check Anemia Panel in the AM -Continue to Monitor for S/Sx of Bleeding; NO overt bleeding noted -Repeat CBC in the AM    COPD -Stable, no signs of acute exacerbation.  Continue home inhaler.   GERD -C/w IV Protonix 40 mg daily.   Hypertension -Stable.  Resume home medication when she is able to tolerate p.o. intake.   Hyperlipidemia -Resume statin when she is able to tolerate p.o. intake.   Insulin-Dependent Type 2 Diabetes -A1c 6.8 in June 2023, repeat ordered.   Glucose 181.   -Continue home long-acting insulin at 1/2 of usual dose given vomiting/poor p.o. intake.  Semglee 12 units daily ordered.  -Sensitive sliding scale insulin every 4 hours. -CBG and Glucose Trend:  Recent Labs  Lab 09/29/22 0509 09/29/22 0811  GLUCAP 166* 131*   Recent Labs  Lab 09/15/22 1031 09/28/22 2032 09/29/22 0613  GLUCOSE 180* 181* 158*   Will continue to monitor the patient's clinical response to intervention and follow-up on Cardiology evaluation recommendations  DVT prophylaxis: Anticoagulated on a heparin drip    Code Status: Full Code Family Communication: No family at bedside  Disposition Plan:  Level of care: Progressive Status is: Observation The patient will require care spanning > 2 midnights and should be moved to inpatient because: He is going for further cardiac workup and will need to ensure that she tolerates a diet prior to discharge   Consultants:  Cardiology  Procedures:  Echocardiogram done and pending read Myoview being done  Antimicrobials:  Anti-infectives (From admission, onward)    None       Objective: Vitals:   09/29/22 0300 09/29/22 0400 09/29/22 0700 09/29/22 0757  BP: 102/68 122/76 119/85   Pulse: 85 89 91   Resp: 16 17 15    Temp:  98.6 F (37 C)  98.4 F (36.9 C)  TempSrc:  Oral  Oral  SpO2: 97% 99% 98%   Weight:      Height:        Intake/Output Summary (Last 24 hours) at 09/29/2022 0942 Last data filed at 09/29/2022 0231 Gross per 24 hour  Intake 41.43 ml  Output --  Net 41.43 ml   Filed Weights   09/28/22 1814 09/28/22 1930  Weight: 78 kg 78 kg   Data Reviewed: I have personally reviewed following labs and imaging studies  CBC: Recent Labs  Lab 09/28/22 2032  WBC 5.9  HGB 9.5*  HCT 28.0*  MCV 104.9*  PLT 161   Basic Metabolic Panel: Recent Labs  Lab 09/28/22 2032 09/28/22 2243 09/29/22 0613  NA 135  --  135  K 3.2*  --  3.6  CL 100  --  98  CO2 25  --  27  GLUCOSE 181*  --   158*  BUN 12  --  11  CREATININE 0.89  --  0.85  CALCIUM 8.8*  --  8.5*  MG  --  1.6* 1.9   GFR: Estimated Creatinine Clearance: 77.7 mL/min (by C-G formula based on SCr of 0.85 mg/dL). Liver Function Tests: No results for input(s): "AST", "ALT", "ALKPHOS", "BILITOT", "PROT", "ALBUMIN" in the last 168 hours. No results for input(s): "LIPASE", "AMYLASE" in the last 168 hours. No results for input(s): "AMMONIA" in the last 168 hours. Coagulation Profile: No results for input(s): "INR", "PROTIME" in the last 168 hours. Cardiac Enzymes: No results for input(s): "CKTOTAL", "CKMB", "CKMBINDEX", "TROPONINI" in the last 168 hours. BNP (last 3 results) No results for input(s): "PROBNP" in the last 8760 hours. HbA1C: No results for input(s): "HGBA1C" in the last 72 hours. CBG: Recent Labs  Lab 09/29/22 0509 09/29/22 0811  GLUCAP 166* 131*   Lipid Profile: No results for input(s): "CHOL", "HDL", "LDLCALC", "TRIG", "CHOLHDL", "LDLDIRECT" in the last 72 hours. Thyroid Function Tests: No results for input(s): "TSH", "T4TOTAL", "FREET4", "T3FREE", "THYROIDAB" in the last 72 hours. Anemia Panel: No results for input(s): "VITAMINB12", "FOLATE", "FERRITIN", "TIBC", "IRON", "RETICCTPCT" in the last 72 hours. Sepsis Labs: No results for input(s): "PROCALCITON", "LATICACIDVEN" in the last 168 hours.  No results found for this or any previous visit (from the past 240 hour(s)).   Radiology Studies: CT ABDOMEN PELVIS WO CONTRAST  Result Date: 09/29/2022 CLINICAL DATA:  57 year old female with history of acute onset of nonlocalized abdominal pain with nausea and vomiting for the past 3 days and constipation. History of non-small cell lung cancer. * Tracking Code: BO * EXAM: CT ABDOMEN AND PELVIS WITHOUT CONTRAST TECHNIQUE: Multidetector CT imaging of the abdomen and pelvis was performed following the standard protocol without IV contrast. RADIATION DOSE REDUCTION: This exam was performed according to  the departmental dose-optimization program which includes automated exposure control, adjustment of the mA and/or kV according to patient size and/or use of iterative reconstruction technique. COMPARISON:  CT of the abdomen and pelvis 08/02/2022. FINDINGS: Comment: Today's study is limited for detection and characterization of visceral and/or vascular lesions by lack of IV contrast. Lower chest: Small left and trace right pleural effusions lying dependently. Atherosclerotic calcifications in the descending thoracic aorta as well as the left circumflex and right coronary arteries. Central venous catheter tip in the right atrium incidentally noted. Hepatobiliary: Low-attenuation lesion in the central aspect of segment 7 of the liver (axial image 15 of series 2), similar to prior studies estimated to measure approximately 2.6 x 1.6  cm on today's exam, incompletely characterized without IV contrast. No other definite new suspicious cystic or solid hepatic lesions are confidently identified on today's noncontrast CT examination. Amorphous intermediate attenuation material lying dependently within the gallbladder likely reflects biliary sludge and/or noncalcified gallstones. Gallbladder does not appear distended. Pancreas: No definite pancreatic mass or peripancreatic fluid collections or inflammatory changes are appreciated on today's noncontrast CT examination. Spleen: Unremarkable. Adrenals/Urinary Tract: High attenuation material within the collecting systems of both kidneys, ureters, and the urinary bladder, related to recent contrast-enhanced chest CTA. Unenhanced appearance of the kidneys is otherwise unremarkable. 1.9 x 1.7 cm low-attenuation (-3 HU) right adrenal nodule (axial image 23 of series 2), similar to prior studies, compatible with an adenoma. Left adrenal gland is unremarkable in appearance. Urinary bladder is unremarkable in appearance. Stomach/Bowel: Unenhanced appearance of the stomach is  unremarkable. No definite pathologic dilatation of small bowel or colon. Appendix is not confidently identified may be surgically absent. Vascular/Lymphatic: Atherosclerosis in the abdominal aorta and pelvic vasculature. No appreciable lymphadenopathy confidently identified in the abdomen or pelvis on today's noncontrast CT examination. Reproductive: Unenhanced appearance of the uterus and ovaries is unremarkable. Other: Small to moderate volume of low-intermediate attenuation (25 HU) ascites. No pneumoperitoneum. Mild diffuse mesenteric edema. Musculoskeletal: Diffuse body wall edema. There are no aggressive appearing lytic or blastic lesions noted in the visualized portions of the skeleton. IMPRESSION: 1. Small to moderate volume of ascites, slightly increased compared to the prior examination. There is also mild mesenteric edema and diffuse body wall edema along with bilateral pleural effusions; imaging findings that suggest a state of anasarca. 2. Biliary sludge lying dependently within the gallbladder, without definite findings to suggest an acute cholecystitis. 3. Low-attenuation lesion in the central aspect of segment 7 of the liver grossly unchanged compared to prior studies, likely benign (potentially a cavernous hemangioma). 4. Aortic atherosclerosis, in addition to least 2 vessel coronary artery disease. Please note that although the presence of coronary artery calcium documents the presence of coronary artery disease, the severity of this disease and any potential stenosis cannot be assessed on this non-gated CT examination. Assessment for potential risk factor modification, dietary therapy or pharmacologic therapy may be warranted, if clinically indicated. 5. Small right adrenal adenoma, stable. 6. Additional incidental findings, as above. Electronically Signed   By: Trudie Reed M.D.   On: 09/29/2022 05:57   CT Angio Chest PE W and/or Wo Contrast  Result Date: 09/28/2022 CLINICAL DATA:  Chest  pain and cardiac palpitations for 1 week, history of non-small cell lung carcinoma, initial encounter EXAM: CT ANGIOGRAPHY CHEST WITH CONTRAST TECHNIQUE: Multidetector CT imaging of the chest was performed using the standard protocol during bolus administration of intravenous contrast. Multiplanar CT image reconstructions and MIPs were obtained to evaluate the vascular anatomy. RADIATION DOSE REDUCTION: This exam was performed according to the departmental dose-optimization program which includes automated exposure control, adjustment of the mA and/or kV according to patient size and/or use of iterative reconstruction technique. CONTRAST:  80mL OMNIPAQUE IOHEXOL 350 MG/ML SOLN COMPARISON:  Chest x-ray from earlier in the same FINDINGS: Cardiovascular: No aneurysmal dilatation of the thoracic aorta is noted. Changes of prior coronary bypass grafting are identified. The pulmonary artery shows a normal branching pattern bilaterally. No filling defect to suggest pulmonary embolism is noted. Diffuse coronary calcifications are noted. Left chest wall port is noted. Mediastinum/Nodes: Thoracic inlet is within normal limits. The esophagus as visualized is within normal limits. No hilar or mediastinal adenopathy is noted. Lungs/Pleura:  Small left pleural effusion is noted. A 13 mm somewhat spiculated lesion is noted in the right upper lobe. This has decreased in size from the prior exam at which time it measured up to 15 mm. No new parenchymal nodules are seen. No pneumothorax is noted. Upper Abdomen: Visualized upper abdomen demonstrates mild ascites and mild changes of anasarca. No other focal abnormality is noted. Musculoskeletal: No acute rib abnormality is noted. No compression deformity is seen. Review of the MIP images confirms the above findings. IMPRESSION: No evidence of pulmonary emboli. 13 mm spiculated lesion in the right upper lobe decreased in size from the prior exam at which time it measured 15 mm. Small  left pleural effusion. Electronically Signed   By: Alcide Clever M.D.   On: 09/28/2022 22:36   DG Chest 2 View  Result Date: 09/28/2022 CLINICAL DATA:  History of non-small cell carcinoma with chest pain, initial encounter EXAM: CHEST - 2 VIEW COMPARISON:  08/18/2021, chest CT from 08/02/2022 FINDINGS: Cardiac shadow is within normal limits. Postsurgical changes are again seen. Left chest wall port is noted. Lungs are well aerated bilaterally. Previously seen right upper lobe nodule is not well appreciated on today's exam. IMPRESSION: No acute abnormality noted. Known right upper lobe nodule is not well appreciated on today's exam. Electronically Signed   By: Alcide Clever M.D.   On: 09/28/2022 19:35     Scheduled Meds:  insulin aspart  0-9 Units Subcutaneous Q4H   insulin glargine-yfgn  12 Units Subcutaneous Daily   mometasone-formoterol  2 puff Inhalation BID   pantoprazole (PROTONIX) IV  40 mg Intravenous Q24H   Continuous Infusions:  heparin 900 Units/hr (09/29/22 0540)    LOS: 0 days   Marguerita Merles, DO [facility] Available via Epic secure chat 7am-7pm After these hours, please refer to coverage provider listed on [email or web address] 09/29/2022, 9:42 AM

## 2022-09-29 NOTE — ED Provider Notes (Signed)
  Physical Exam  BP (!) 122/91   Pulse 94   Temp 98.6 F (37 C) (Oral)   Resp 15   Ht 5\' 7"  (1.702 m)   Wt 78 kg   LMP 06/12/2016 (Approximate)   SpO2 100%   BMI 26.94 kg/m   Physical Exam  Procedures  .Critical Care  Performed by: Al Decant, PA-C Authorized by: Al Decant, PA-C   Critical care provider statement:    Critical care time (minutes):  85   Critical care time was exclusive of:  Separately billable procedures and treating other patients   Critical care was necessary to treat or prevent imminent or life-threatening deterioration of the following conditions:  Cardiac failure   Critical care was time spent personally by me on the following activities:  Blood draw for specimens, development of treatment plan with patient or surrogate, discussions with consultants, discussions with primary provider, evaluation of patient's response to treatment, examination of patient, gastric intubation, obtaining history from patient or surrogate, interpretation of cardiac output measurements, review of old charts, re-evaluation of patient's condition, pulse oximetry, ordering and review of radiographic studies, ordering and review of laboratory studies and ordering and performing treatments and interventions   I assumed direction of critical care for this patient from another provider in my specialty: yes     Care discussed with: admitting provider     ED Course / MDM   Clinical Course as of 09/29/22 0315  Wed Sep 29, 2022  0046 Hx triple bypass last year, lung cancer, hypertension. Last chemo last week. Patient coming in with chest pain for 1 week, centralized, intermittent, non exertional. Last 3 days nausea and vomiting, no fever, no ab pain.  [CG]    Clinical Course User Index [CG] Al Decant, PA-C   Medical Decision Making Amount and/or Complexity of Data Reviewed Labs: ordered. Radiology: ordered.  Risk OTC drugs. Prescription drug  management. Decision regarding hospitalization.   57 year old female signed out to me at shift change pending discussion with cardiology.  Please see previous provider note for further details.  In short, 57 year old female who presents with 1 week of centralized intermittent chest pain associated with shortness of breath.  Patient has history of triple bypass 1 year ago.  Patient also currently undergoing chemotherapy for lung cancer.  Patient signed out to me with a troponin 118, delta 183.  EKG shows no ST elevation.  Patient loaded with aspirin.  Cardiology consult placed once more.  Unfractionated heparin level also ordered.  Discussed with Dr. Geraldo Pitter, cardiology fellow, who has requested that the patient be admitted to the hospitalist service, have serial troponins so we can trend.  He is also stating that she will need to be heparinized so this has been ordered.  He is requesting that we keep the patient at [facility] and trend her troponins and admit to medicine.  Patient reexamined, denies any chest pain at this time as we will hold off on sublingual nitroglycerin.  Patient case discussed with Dr. Loney Loh who is agreed to admit the patient.  Patient stable at time of admission.  She is agreeable to plan.       Al Decant, PA-C 09/29/22 0315    Shon Baton, MD 09/30/22 0111

## 2022-09-29 NOTE — ED Notes (Signed)
Pt taken to CT.

## 2022-09-29 NOTE — Hospital Course (Addendum)
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  On arrival to the ED she was tachycardic in the 110s however she was afebrile and labs showed no leukocytosis.  Basic labs were done and she was noted to be hypokalemic and hypomagnesemic and troponin was elevated as well as a D-dimer of 3.81.  CTA of the chest PE protocol was obtained an showed no evidence for PE but did show the 13 mm spiculated lesion in the right upper lobe which was decreased in size from prior exam from where it was 15 mm.  She also had a small left pleural effusion.  EKG was done and showed sinus tachycardia with no acute ischemic changes.  The ED PA discussed with on-call cardiology team recommended starting IV heparin and trending troponin.  Cardiology team felt there is no urgent need to transfer her to [facility].  She was admitted to [facility] and was given aspirin, Zofran, Percocet, oral potassium, IV magnesium and started on IV heparin.  The patient had described the chest discomfort and dyspnea on exertion worsening for the last week.  She describes a chest pain as being substernal/left-sided and described as a squeezing sensation but felt that the palpitations were the worst.  She continued with intermittent episodes over the week and felt very dyspneic and short of breath even walking small distances.  She reports chronic bilateral lower extremity edema which is slightly worsened.  Also had some intermittent nausea and vomiting related to her cancer treatment for the past 3 days  has been constantly vomiting and has not been able to take anything p.o.  Constipated and not had a bowel movement for 4 days.  Of note she reports generalized abdominal discomfort and a history of intra-abdominal cancer with metastasis which she is taking pain medications at home for.  Currently she had recurrent chest discomfort and palpitations with cardiology has evaluated and recommending pursuit for further workup despite their suspicion for ACS being on the lower side.  They feel that the course of action is to identify potential ischemic etiologies and obtaining a Myoview.  Echocardiogram has already been done and pending read.  Currently she is being admitted treated for the following but not limited to:  Assessment and Plan:  Chest Pain r/o ACS History of CAD status post CABG and PCI -CTA PE study negative.   -EKG without acute ischemic changes.  Troponin 183> 118>192 and is now 173.   -Was having Chest discomfort this AM but Not having active chest pain at this time.   -She was given aspirin in the ED.  Resumed ASA, Statin, and NTG -Continue cardiac monitoring, IV heparin now stopped and trend troponin.  -Echocardiogram done and showed "Left ventricular ejection fraction, by estimation, is 60 to 65%. The left ventricle has normal function. The left ventricle has no regional wall motion abnormalities. There is mild concentric left ventricular hypertrophy. Left ventricular diastolic parameters are consistent with Grade I diastolic dysfunction (impaired relaxation). Right ventricular systolic function is moderately reduced. The right ventricular size is not well visualized. There is prominent systolic anterior motion of the  mitral valve, but there is no LV outflow obstruction (Valsalva maneuver was not performed). The mitral valve is normal in structure. Mild mitral valve regurgitation. No evidence of mitral stenosis. The aortic valve is normal in structure. Aortic valve regurgitation is not  visualized. No aortic stenosis is present. The inferior vena cava is normal in size with greater than 50% respiratory variability, suggesting right atrial pressure of 3 mmHg." -Cardiology consulted and recommending Myoview which was done and showed 'No reversible ischemia. Fixed defect in the inferolateral wall suggest prior infarction. Normal left ventricular wall motion. Left ventricular ejection fraction 67%. Non invasive risk stratification*: Low"  Palpitations and Tachycardia -Resume BB with Carvedilol 12.5 mg po BID -Continue to Monitor on Telemetry -C/w Ambulation as it could be related to Deconditioning -May consider 30 Day Holter to evaluate -C/w PT/OT   Generalized Weakness and Deconditioning -C/w PT/OT   Generalized abdominal pain, nausea, vomiting, improving  -Stat CT abdomen pelvis ordered and done and showed "Small to moderate volume of ascites, slightly increased compared to the prior examination. There is also mild mesenteric edema and diffuse body wall edema along with bilateral pleural effusions; imaging findings that suggest a state of anasarca. Biliary sludge lying dependently within the gallbladder, without definite findings to suggest an acute cholecystitis. Low-attenuation lesion in the central aspect of segment 7 of the liver grossly unchanged compared to prior studies, likely benign (potentially a cavernous hemangioma).  Aortic atherosclerosis, in addition to least 2 vessel coronary artery disease. Please note that although the presence of coronary artery calcium documents the presence of coronary artery disease, the severity of this disease and any potential stenosis cannot be assessed on this non-gated CT examination. Assessment for potential risk factor modification, dietary therapy or pharmacologic therapy may be warranted, if clinically indicated. Small right adrenal adenoma, stable. Additional incidental findings, as above" -Continue antiemetic as needed and resume  Metaclopramide if necessary  -Since she has a family member + for COVID will check since she never was checked on Admission  Left Lower Jaw Swelling -Check Maxillofacial CT   Mild hypokalemia and hypomagnesemia -Likely due to vomiting/poor p.o. intake. -Electrolyte Trend: Recent Labs  Lab 09/15/22 1031 09/28/22 2032 09/28/22 2243 09/29/22 0613 09/30/22 1027 10/01/22 0110  K 3.6 3.2*  --  3.6 3.5 3.3*  MG  --   --    < > 1.9 1.7 1.7   < > = values in this interval not displayed.  -Continue to Monitor and Trend and Replete as Necessary -Repeat CMP and Mag in the AM   Stage IV non-small cell lung cancer -Currently on chemotherapy.  Outpatient oncology follow-up and plans to discuss stopping Chemotherapy with her Primary Oncologist Dr. Bradd Canary left pleural effusion -Seen on CT.  Possibly related to malignancy.  Check BNP -Repeat CXR in the AM    Macrocytic Anemia -In the setting of cancer treatment/chemotherapy.  -Hgb/Hct Trend: Recent Labs  Lab 09/15/22 1031 09/28/22 2032 09/29/22 1007 09/30/22 0826 09/30/22 0829 10/01/22 0110  HGB 9.4* 9.5* 8.0* DUP RN DANA 8.3* 7.3*  HCT 27.2* 28.0* 24.0* DUP RN DANA 25.1* 21.8*  MCV 106.7* 104.9* 105.3* DUP RN DANA 108.2* 108.5*  -Hemoglobin is stable, continue to monitor labs. -Check Anemia Panel in the AM -Continue to Monitor for S/Sx of Bleeding; NO overt bleeding noted -Repeat CBC in the AM   Bilateral LE Swelling -Check LE Duplex given her Elevated D-Dimer of 3.81 -Give IV Lasix 40 mg x1   COPD -  Stable, no signs of acute exacerbation.  Continue home inhaler.   GERD/GI Prophylaxis -IV Protonix 40 mg daily changed to Pantoprazole 40 mg po BID   Hypertension -Stable.  Resumed home medication with Carvedilol 12.5 mg po BID -Continue to Monitor BP per Procol  Hyperlipidemia -Resumed Atorvastatin 40 mg po Daily    Insulin-Dependent Type 2 Diabetes -A1c 6.8 in June 2023, repeat ordered.  Glucose 181.    -Continue home long-acting insulin at 1/2 of usual dose given vomiting/poor p.o. intake.  Semglee 12 units daily ordered.  -Sensitive sliding scale insulin every 4 hours. -CBG and Glucose Trend: Recent Labs  Lab 09/30/22 1225 09/30/22 1816 09/30/22 2044 10/01/22 0028 10/01/22 0359 10/01/22 0740 10/01/22 1136  GLUCAP 130* 104* 156* 104* 102* 107* 132*   Recent Labs  Lab 09/15/22 1031 09/28/22 2032 09/29/22 0613 09/30/22 1027 10/01/22 0110  GLUCOSE 180* 181* 158* 175* 98   Constipation -Bowel Regimen initiated and will be continued

## 2022-09-29 NOTE — ED Notes (Signed)
ED TO INPATIENT HANDOFF REPORT  Name/Age/Gender Cassidy [name] 57 y.o. female  Code Status    Code Status Orders  (From admission, onward)           Start     Ordered   09/29/22 0347  Full code  Continuous       Question:  By:  Answer:  Consent: discussion documented in EHR   09/29/22 0347           Code Status History     Date Active Date Inactive Code Status Order ID Comments User Context   06/30/2021 1649 07/05/2021 1828 Full Code [id number]  Parke Poisson Inpatient   04/09/2021 0901 04/09/2021 1659 Full Code [id number]  Corky Crafts, MD Inpatient   10/18/2015 1249 10/21/2015 1923 Full Code [id number]  Meredeth Ide, MD Inpatient   06/14/2014 1828 06/15/2014 1622 Full Code [id number]  Manus Rudd, MD Inpatient   02/08/2014 2118 02/10/2014 1708 Full Code [id number]  Manus Rudd, MD Inpatient   02/08/2014 1412 02/08/2014 2118 Full Code [id number]  Manus Rudd, MD Inpatient   11/08/2013 1406 11/09/2013 1452 Full Code [id number]  Marykay Lex, MD Inpatient   05/26/2012 0407 05/30/2012 1921 Full Code [id number]  Eduard Clos, MD Inpatient   11/18/2011 0027 11/18/2011 1617 Full Code [id number]  Georgina Pillion D Inpatient   11/17/2011 1442 11/18/2011 0027 Full Code [id number]  Cindy Hazy, RN Inpatient   08/13/2011 1710 08/14/2011 1413 Full Code [id number]  Montez Hageman, RN Inpatient   04/26/2011 1818 04/27/2011 1723 Full Code [id number]  Cindy Hazy, RN Inpatient   12/18/2010 1741 12/21/2010 1205 Full Code [id number]  Brynda Rim Inpatient       Home/SNF/Other Home  Chief Complaint Chest pain [R07.9]  Level of Care/Admitting Diagnosis ED Disposition     ED Disposition  Admit   Condition  --   Comment  Hospital Area: [facility] [[id number]]  Level of Care: Progressive [102]  Admit to Progressive based on following criteria: CARDIOVASCULAR & THORACIC of moderate stability with acute coronary syndrome symptoms/low  risk myocardial infarction/hypertensive urgency/arrhythmias/heart failure potentially compromising stability and stable post cardiovascular intervention patients.  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  s/p PCTA to dRCA and ostial RPAV-PLA vessel + PDA branch (05/2012)  b. [place] s/p DES to mLAD - Resolute DES 3.0 x 22 (3.37mm -->3.3 mm)   Bipolar depression (HCC)    Breast abscess    a. right side.    COPD (chronic obstructive pulmonary disease) (HCC)    Depression    Diabetic peripheral neuropathy (HCC)    Fibromyalgia    Genital warts    GERD (gastroesophageal reflux disease)    History of hiatal hernia    HLD (hyperlipidemia)    Hypertension    Lung cancer (HCC)    Pain in limb    a. LE VENOUS DUPLEX, 02/05/2009 - no  evidence of deep vein thrombosis, Baker's cyst   Pneumonia    PTSD (post-traumatic stress disorder)    Sickle cell trait (HCC)    Tobacco abuse    Tooth caries    Type II diabetes mellitus (HCC)     Allergies Allergies  Allergen Reactions   Amoxil [Amoxicillin] Hives    Has patient had a PCN reaction causing immediate rash, facial/tongue/throat swelling, SOB or lightheadedness with hypotension: Yes Has patient had a PCN reaction causing severe rash involving mucus membranes or skin necrosis: No Has patient had a PCN reaction that required hospitalization No Has patient had a PCN reaction occurring within the last  10 years: Yes If all of the above answers are "NO", then may proceed with Cephalosporin use.   Chantix [Varenicline] Other (See Comments)    Nightmares Hallucinations     Suboxone [Buprenorphine Hcl-Naloxone Hcl] Nausea And Vomiting   Emend [Fosaprepitant Dimeglumine] Itching    See notes for transfusion on 8/23. Pt reported new onset of itching after Emend infusion was started. 25mg  Benadryl IV given per MD. Pt tolerated remaining infusion well.     IV Location/Drains/Wounds Patient Lines/Drains/Airways Status     Active Line/Drains/Airways     Name Placement date Placement time Site Days   Implanted Port 12/08/21 Left Chest 12/08/21  1529  Chest  295   Peripheral IV 09/29/22 22 G 1.75" Posterior;Right Forearm 09/29/22  0450  Forearm  less than 1            Labs/Imaging Results for orders placed or performed during the hospital encounter of 09/28/22 (from the past 48 hour(s))  Troponin I (High Sensitivity)     Status: Abnormal   Collection Time: 09/28/22  8:30 PM  Result Value Ref Range   Troponin I (High Sensitivity) 183 (HH) <18 ng/L    Comment: CRITICAL RESULT CALLED TO, READ BACK BY AND VERIFIED WITH RIVERS,TJ RN @ 2338 09/28/22 BY CHILDRESS,E (NOTE) Elevated high sensitivity troponin I (hsTnI) values and significant  changes across serial measurements may suggest ACS but many other  chronic and acute conditions are known to elevate hsTnI results.  Refer to the "Links" section for chest pain algorithms and additional  guidance. Performed at [facility], [address], [place], [place] [address]   Basic metabolic panel     Status: Abnormal   Collection Time: 09/28/22  8:32 PM  Result Value Ref Range   Sodium 135 135 - 145 mmol/L   Potassium 3.2 (L) 3.5 - 5.1 mmol/L   Chloride 100 98 - 111 mmol/L   CO2 25 22 - 32 mmol/L   Glucose, Bld 181 (H) 70 - 99 mg/dL    Comment: Glucose reference range applies only to samples taken after fasting for at  least 8 hours.   BUN 12 6 - 20 mg/dL   Creatinine, Ser 8.41 0.44 - 1.00 mg/dL   Calcium 8.8 (L) 8.9 - 10.3 mg/dL   GFR, Estimated >32 >44 mL/min    Comment: (NOTE) Calculated using the CKD-EPI Creatinine Equation (2021)    Anion gap 10 5 - 15    Comment: Performed at [facility], [address] [place], [place] [address]  CBC     Status: Abnormal   Collection Time: 09/28/22  8:32 PM  Result Value Ref Range   WBC 5.9 4.0 - 10.5 K/uL   RBC 2.67 (L) 3.87 - 5.11 MIL/uL   Hemoglobin 9.5 (L) 12.0 - 15.0 g/dL   HCT  28.0 (L) 36.0 - 46.0 %   MCV 104.9 (H) 80.0 - 100.0 fL   MCH 35.6 (H) 26.0 - 34.0 pg   MCHC 33.9 30.0 - 36.0 g/dL   RDW 69.6 29.5 - 28.4 %   Platelets 161 150 - 400 K/uL   nRBC 0.0 0.0 - 0.2 %    Comment: Performed at [facility], [address], [place], [place] [address]  Troponin I (High Sensitivity)     Status: Abnormal   Collection Time: 09/28/22  8:32 PM  Result Value Ref Range   Troponin I (High Sensitivity) 118 (HH) <18 ng/L    Comment: CRITICAL RESULT CALLED TO, READ BACK BY AND VERIFIED WITH COLES,L RN @ 2117 09/28/22 BY CHILDRESS,E (NOTE) Elevated high sensitivity troponin I (hsTnI) values and significant  changes across serial measurements may suggest ACS but many other  chronic and acute conditions are known to elevate hsTnI results.  Refer to the "Links" section for chest pain algorithms and additional  guidance. Performed at [facility], [address], [place], [place] [address]   D-dimer, quantitative     Status: Abnormal   Collection Time: 09/28/22  8:32 PM  Result Value Ref Range   D-Dimer, Quant 3.81 (H) 0.00 - 0.50 ug/mL-FEU    Comment: (NOTE) At the manufacturer cut-off value of 0.5 g/mL FEU, this assay has a negative predictive value of 95-100%.This assay is intended for use in conjunction with a clinical pretest probability (PTP) assessment model to exclude pulmonary embolism (PE)  and deep venous thrombosis (DVT) in outpatients suspected of PE or DVT. Results should be correlated with clinical presentation. Performed at [facility], [address] [place], [place] [address]   Magnesium     Status: Abnormal   Collection Time: 09/28/22 10:43 PM  Result Value Ref Range   Magnesium 1.6 (L) 1.7 - 2.4 mg/dL    Comment: Performed at [facility], [address], [place], [place] [address]  Heparin level (unfractionated)     Status: Abnormal   Collection Time: 09/29/22  1:21 AM  Result Value Ref Range   Heparin Unfractionated <0.10 (L) 0.30 - 0.70 IU/mL    Comment: (NOTE) The clinical reportable range upper limit is being lowered to >1.10 to align with the FDA approved guidance for the current laboratory assay.  If heparin results are below expected values, and patient dosage has  been confirmed, suggest follow up testing of antithrombin III levels. Performed at [facility], [address], [place], [place] [address]   Troponin I (High Sensitivity)     Status: Abnormal   Collection Time: 09/29/22  2:31 AM  Result Value Ref Range   Troponin I (High Sensitivity) 192 (HH) <18 ng/L    Comment: CRITICAL VALUE NOTED. VALUE IS CONSISTENT WITH PREVIOUSLY REPORTED/CALLED VALUE (NOTE) Elevated high sensitivity troponin I (hsTnI) values and significant  changes across serial measurements may suggest ACS but many other  chronic and acute conditions are known to elevate hsTnI results.  Refer to the "Links" section for chest pain algorithms and additional  guidance. Performed at [facility], [address], [place], [place] [address]   CBG monitoring, ED     Status: Abnormal   Collection Time: 09/29/22  5:09 AM  Result Value Ref Range   Glucose-Capillary 166 (H) 70 - 99 mg/dL    Comment: Glucose reference range applies only to samples taken after fasting for at least 8 hours.  Basic metabolic  panel     Status: Abnormal   Collection Time: 09/29/22  6:13 AM  Result Value Ref Range   Sodium 135 135 - 145 mmol/L   Potassium 3.6 3.5 - 5.1 mmol/L   Chloride 98 98 - 111 mmol/L   CO2 27 22 - 32 mmol/L   Glucose, Bld 158 (H) 70 - 99 mg/dL    Comment: Glucose reference range applies only to samples taken after fasting for at least 8 hours.   BUN 11 6 - 20 mg/dL   Creatinine, Ser 8.46 0.44 - 1.00 mg/dL   Calcium 8.5 (L) 8.9 - 10.3 mg/dL   GFR, Estimated >96 >29 mL/min    Comment: (NOTE) Calculated using the CKD-EPI Creatinine Equation (2021)    Anion gap 10 5 - 15    Comment: Performed at [facility], [address], [place], [place] [address]  Magnesium     Status: None   Collection Time: 09/29/22  6:13 AM  Result Value Ref Range   Magnesium 1.9 1.7 - 2.4 mg/dL    Comment: Performed at [facility], [address], [place], [place] [address]  Brain natriuretic peptide     Status: Abnormal   Collection Time: 09/29/22  6:13 AM  Result Value Ref Range   B Natriuretic Peptide 112.4 (H) 0.0 - 100.0 pg/mL    Comment: Performed at [facility], [address] [place], [place] [address]  Troponin I (High Sensitivity)     Status: Abnormal   Collection Time: 09/29/22  6:13 AM  Result Value Ref Range   Troponin I (High Sensitivity) 173 (HH) <18 ng/L    Comment: CRITICAL VALUE NOTED. VALUE IS CONSISTENT WITH PREVIOUSLY REPORTED/CALLED VALUE (NOTE) Elevated high sensitivity troponin I (hsTnI) values and significant  changes across serial measurements may suggest ACS but many other  chronic and acute conditions are known to elevate hsTnI results.  Refer to the "Links" section for chest pain algorithms and additional  guidance. Performed at [facility], [address] [place], [place] [address]   CBG monitoring, ED     Status: Abnormal   Collection Time: 09/29/22  8:11 AM  Result Value Ref Range    Glucose-Capillary 131 (H) 70 - 99 mg/dL    Comment: Glucose reference range applies only to samples taken after fasting for at least 8 hours.  Heparin level (unfractionated)     Status: Abnormal   Collection Time: 09/29/22  9:53 AM  Result Value Ref Range   Heparin Unfractionated 0.23 (L) 0.30 - 0.70 IU/mL    Comment: (NOTE) The clinical reportable range upper limit is being lowered to >1.10 to align with the FDA approved guidance for the current laboratory assay.  If heparin results are below expected values, and patient dosage has  been confirmed, suggest follow up testing of antithrombin III levels. Performed at [facility], [address], [place], [place] [address]   CBC     Status: Abnormal   Collection Time: 09/29/22 10:07 AM  Result Value Ref Range   WBC 5.3 4.0 - 10.5 K/uL   RBC 2.28 (L) 3.87 - 5.11 MIL/uL   Hemoglobin 8.0 (L) 12.0 - 15.0 g/dL   HCT 42.5 (L) 95.6 - 38.7 %   MCV 105.3 (H) 80.0 - 100.0 fL   MCH 35.1 (H) 26.0 - 34.0 pg   MCHC 33.3 30.0 - 36.0 g/dL   RDW 56.4 33.2 - 95.1 %  Platelets 160 150 - 400 K/uL   nRBC 0.0 0.0 - 0.2 %    Comment: Performed at [facility], [address], [place], [place] [address]  CBG monitoring, ED     Status: Abnormal   Collection Time: 09/29/22  4:47 PM  Result Value Ref Range   Glucose-Capillary 147 (H) 70 - 99 mg/dL    Comment: Glucose reference range applies only to samples taken after fasting for at least 8 hours.   NM Myocar Multi W/Spect W/Wall Motion / EF  Result Date: 09/29/2022 CLINICAL DATA:  Chest pain.  58 year old female EXAM: MYOCARDIAL IMAGING WITH SPECT (REST AND PHARMACOLOGIC-STRESS) GATED LEFT VENTRICULAR WALL MOTION STUDY LEFT VENTRICULAR EJECTION FRACTION TECHNIQUE: Standard myocardial SPECT imaging was performed after resting intravenous injection of 10 mCi Tc-66m sestamibi. Subsequently, intravenous infusion of Lexiscan was performed under the supervision of the  Cardiology staff. At peak effect of the drug, 30 mCi Tc-17m sestamibi was injected intravenously and standard myocardial SPECT imaging was performed. Quantitative gated imaging was also performed to evaluate left ventricular wall motion, and estimate left ventricular ejection fraction. COMPARISON:  None Available. FINDINGS: Perfusion: Moderate size region of decreased perfusion to the apical and mid segment of the inferolateral wall which is fixed on rest and stress imaging. Wall Motion: Normal left ventricular wall motion. No left ventricular dilation. Left Ventricular Ejection Fraction: 67 % End diastolic volume 94 ml End systolic volume 31 ml IMPRESSION: 1. No reversible ischemia. Fixed defect in the inferolateral wall suggest prior infarction. 2. Normal left ventricular wall motion. 3. Left ventricular ejection fraction 67% 4. Non invasive risk stratification*: Low *2012 Appropriate Use Criteria for Coronary Revascularization Focused Update: J Am Coll Cardiol. 2012;59(9):857-881. http://content.dementiazones.com.aspx?articleid=1201161 Electronically Signed   By: Genevive Bi M.D.   On: 09/29/2022 16:37   ECHOCARDIOGRAM COMPLETE  Result Date: 09/29/2022    ECHOCARDIOGRAM REPORT   Patient Name:   Cassidy [name] Date of Exam: 09/29/2022 Medical Rec #:  [id number]       Height:       67.0 in Accession #:    [id number]      Weight:       172.0 lb Date of Birth:  08/19/1965      BSA:          1.897 m Patient Age:    56 years        BP:           140/81 mmHg Patient Gender: F               HR:           98 bpm. Exam Location:  Inpatient Procedure: Cardiac Doppler, Color Doppler and 2D Echo Indications:    Chest Pain R07.9  History:        Patient has prior history of Echocardiogram examinations, most                 recent 06/30/2021. COPD; Risk Factors:Hypertension, Diabetes and                 Dyslipidemia.  Sonographer:    Harriette Bouillon RDCS Referring Phys: [id number] VASUNDHRA RATHORE IMPRESSIONS  1. Left  ventricular ejection fraction, by estimation, is 60 to 65%. The left ventricle has normal function. The left ventricle has no regional wall motion abnormalities. There is mild concentric left ventricular hypertrophy. Left ventricular diastolic parameters are consistent with Grade I diastolic dysfunction (impaired relaxation).  2. Right ventricular systolic function is moderately  reduced. The right ventricular size is not well visualized.  3. There is prominent systolic anterior motion of the mitral valve, but there is no LV outflow obstruction (Valsalva maneuver was not performed). The mitral valve is normal in structure. Mild mitral valve regurgitation. No evidence of mitral stenosis.  4. The aortic valve is normal in structure. Aortic valve regurgitation is not visualized. No aortic stenosis is present.  5. The inferior vena cava is normal in size with greater than 50% respiratory variability, suggesting right atrial pressure of 3 mmHg. FINDINGS  Left Ventricle: Left ventricular ejection fraction, by estimation, is 60 to 65%. The left ventricle has normal function. The left ventricle has no regional wall motion abnormalities. The left ventricular internal cavity size was normal in size. There is  mild concentric left ventricular hypertrophy. Abnormal (paradoxical) septal motion, consistent with left bundle branch block. Left ventricular diastolic parameters are consistent with Grade I diastolic dysfunction (impaired relaxation). Normal left ventricular filling pressure. Right Ventricle: The right ventricular size is not well visualized. No increase in right ventricular wall thickness. Right ventricular systolic function is moderately reduced. Left Atrium: Left atrial size was normal in size. Right Atrium: Right atrial size was normal in size. Pericardium: There is no evidence of pericardial effusion. Mitral Valve: There is prominent systolic anterior motion of the mitral valve, but there is no LV outflow  obstruction (Valsalva maneuver was not performed). The mitral valve is normal in structure. Mild mitral valve regurgitation. No evidence of mitral valve stenosis. Tricuspid Valve: The tricuspid valve is normal in structure. Tricuspid valve regurgitation is not demonstrated. No evidence of tricuspid stenosis. Aortic Valve: The aortic valve is normal in structure. Aortic valve regurgitation is not visualized. No aortic stenosis is present. Aortic valve mean gradient measures 6.4 mmHg. Aortic valve peak gradient measures 13.4 mmHg. Aortic valve area, by VTI measures 2.67 cm. Pulmonic Valve: The pulmonic valve was normal in structure. Pulmonic valve regurgitation is not visualized. No evidence of pulmonic stenosis. Aorta: The aortic root is normal in size and structure. Venous: The inferior vena cava is normal in size with greater than 50% respiratory variability, suggesting right atrial pressure of 3 mmHg. IAS/Shunts: No atrial level shunt detected by color flow Doppler.  LEFT VENTRICLE PLAX 2D LVIDd:         4.30 cm   Diastology LVIDs:         2.90 cm   LV e' medial:    5.44 cm/s LV PW:         1.30 cm   LV E/e' medial:  8.0 LV IVS:        1.30 cm   LV e' lateral:   5.87 cm/s LVOT diam:     2.00 cm   LV E/e' lateral: 7.4 LV SV:         72 LV SV Index:   38 LVOT Area:     3.14 cm  RIGHT VENTRICLE         IVC TAPSE (M-mode): 0.8 cm  IVC diam: 1.20 cm LEFT ATRIUM             Index        RIGHT ATRIUM           Index LA diam:        2.90 cm 1.53 cm/m   RA Area:     10.90 cm LA Vol (A2C):   36.6 ml 19.30 ml/m  RA Volume:   22.10 ml  11.65 ml/m LA  Vol (A4C):   22.8 ml 12.02 ml/m LA Biplane Vol: 30.3 ml 15.98 ml/m  AORTIC VALVE AV Area (Vmax):    2.51 cm AV Area (Vmean):   2.37 cm AV Area (VTI):     2.67 cm AV Vmax:           182.89 cm/s AV Vmean:          115.410 cm/s AV VTI:            0.268 m AV Peak Grad:      13.4 mmHg AV Mean Grad:      6.4 mmHg LVOT Vmax:         146.00 cm/s LVOT Vmean:        87.200 cm/s  LVOT VTI:          0.228 m LVOT/AV VTI ratio: 0.85  AORTA Ao Root diam: 3.20 cm Ao Asc diam:  3.20 cm MITRAL VALVE MV Area (PHT): 4.96 cm    SHUNTS MV Decel Time: 153 msec    Systemic VTI:  0.23 m MV E velocity: 43.70 cm/s  Systemic Diam: 2.00 cm MV A velocity: 59.60 cm/s MV E/A ratio:  0.73 Mihai Croitoru MD Electronically signed by Thurmon Fair MD Signature Date/Time: 09/29/2022/3:07:26 PM    Final    CT ABDOMEN PELVIS WO CONTRAST  Result Date: 09/29/2022 CLINICAL DATA:  57 year old female with history of acute onset of nonlocalized abdominal pain with nausea and vomiting for the past 3 days and constipation. History of non-small cell lung cancer. * Tracking Code: BO * EXAM: CT ABDOMEN AND PELVIS WITHOUT CONTRAST TECHNIQUE: Multidetector CT imaging of the abdomen and pelvis was performed following the standard protocol without IV contrast. RADIATION DOSE REDUCTION: This exam was performed according to the departmental dose-optimization program which includes automated exposure control, adjustment of the mA and/or kV according to patient size and/or use of iterative reconstruction technique. COMPARISON:  CT of the abdomen and pelvis 08/02/2022. FINDINGS: Comment: Today's study is limited for detection and characterization of visceral and/or vascular lesions by lack of IV contrast. Lower chest: Small left and trace right pleural effusions lying dependently. Atherosclerotic calcifications in the descending thoracic aorta as well as the left circumflex and right coronary arteries. Central venous catheter tip in the right atrium incidentally noted. Hepatobiliary: Low-attenuation lesion in the central aspect of segment 7 of the liver (axial image 15 of series 2), similar to prior studies estimated to measure approximately 2.6 x 1.6 cm on today's exam, incompletely characterized without IV contrast. No other definite new suspicious cystic or solid hepatic lesions are confidently identified on today's noncontrast CT  examination. Amorphous intermediate attenuation material lying dependently within the gallbladder likely reflects biliary sludge and/or noncalcified gallstones. Gallbladder does not appear distended. Pancreas: No definite pancreatic mass or peripancreatic fluid collections or inflammatory changes are appreciated on today's noncontrast CT examination. Spleen: Unremarkable. Adrenals/Urinary Tract: High attenuation material within the collecting systems of both kidneys, ureters, and the urinary bladder, related to recent contrast-enhanced chest CTA. Unenhanced appearance of the kidneys is otherwise unremarkable. 1.9 x 1.7 cm low-attenuation (-3 HU) right adrenal nodule (axial image 23 of series 2), similar to prior studies, compatible with an adenoma. Left adrenal gland is unremarkable in appearance. Urinary bladder is unremarkable in appearance. Stomach/Bowel: Unenhanced appearance of the stomach is unremarkable. No definite pathologic dilatation of small bowel or colon. Appendix is not confidently identified may be surgically absent. Vascular/Lymphatic: Atherosclerosis in the abdominal aorta and pelvic vasculature. No appreciable lymphadenopathy confidently  identified in the abdomen or pelvis on today's noncontrast CT examination. Reproductive: Unenhanced appearance of the uterus and ovaries is unremarkable. Other: Small to moderate volume of low-intermediate attenuation (25 HU) ascites. No pneumoperitoneum. Mild diffuse mesenteric edema. Musculoskeletal: Diffuse body wall edema. There are no aggressive appearing lytic or blastic lesions noted in the visualized portions of the skeleton. IMPRESSION: 1. Small to moderate volume of ascites, slightly increased compared to the prior examination. There is also mild mesenteric edema and diffuse body wall edema along with bilateral pleural effusions; imaging findings that suggest a state of anasarca. 2. Biliary sludge lying dependently within the gallbladder, without  definite findings to suggest an acute cholecystitis. 3. Low-attenuation lesion in the central aspect of segment 7 of the liver grossly unchanged compared to prior studies, likely benign (potentially a cavernous hemangioma). 4. Aortic atherosclerosis, in addition to least 2 vessel coronary artery disease. Please note that although the presence of coronary artery calcium documents the presence of coronary artery disease, the severity of this disease and any potential stenosis cannot be assessed on this non-gated CT examination. Assessment for potential risk factor modification, dietary therapy or pharmacologic therapy may be warranted, if clinically indicated. 5. Small right adrenal adenoma, stable. 6. Additional incidental findings, as above. Electronically Signed   By: Trudie Reed M.D.   On: 09/29/2022 05:57   CT Angio Chest PE W and/or Wo Contrast  Result Date: 09/28/2022 CLINICAL DATA:  Chest pain and cardiac palpitations for 1 week, history of non-small cell lung carcinoma, initial encounter EXAM: CT ANGIOGRAPHY CHEST WITH CONTRAST TECHNIQUE: Multidetector CT imaging of the chest was performed using the standard protocol during bolus administration of intravenous contrast. Multiplanar CT image reconstructions and MIPs were obtained to evaluate the vascular anatomy. RADIATION DOSE REDUCTION: This exam was performed according to the departmental dose-optimization program which includes automated exposure control, adjustment of the mA and/or kV according to patient size and/or use of iterative reconstruction technique. CONTRAST:  80mL OMNIPAQUE IOHEXOL 350 MG/ML SOLN COMPARISON:  Chest x-ray from earlier in the same FINDINGS: Cardiovascular: No aneurysmal dilatation of the thoracic aorta is noted. Changes of prior coronary bypass grafting are identified. The pulmonary artery shows a normal branching pattern bilaterally. No filling defect to suggest pulmonary embolism is noted. Diffuse coronary  calcifications are noted. Left chest wall port is noted. Mediastinum/Nodes: Thoracic inlet is within normal limits. The esophagus as visualized is within normal limits. No hilar or mediastinal adenopathy is noted. Lungs/Pleura: Small left pleural effusion is noted. A 13 mm somewhat spiculated lesion is noted in the right upper lobe. This has decreased in size from the prior exam at which time it measured up to 15 mm. No new parenchymal nodules are seen. No pneumothorax is noted. Upper Abdomen: Visualized upper abdomen demonstrates mild ascites and mild changes of anasarca. No other focal abnormality is noted. Musculoskeletal: No acute rib abnormality is noted. No compression deformity is seen. Review of the MIP images confirms the above findings. IMPRESSION: No evidence of pulmonary emboli. 13 mm spiculated lesion in the right upper lobe decreased in size from the prior exam at which time it measured 15 mm. Small left pleural effusion. Electronically Signed   By: Alcide Clever M.D.   On: 09/28/2022 22:36   DG Chest 2 View  Result Date: 09/28/2022 CLINICAL DATA:  History of non-small cell carcinoma with chest pain, initial encounter EXAM: CHEST - 2 VIEW COMPARISON:  08/18/2021, chest CT from 08/02/2022 FINDINGS: Cardiac shadow is within normal limits.  Postsurgical changes are again seen. Left chest wall port is noted. Lungs are well aerated bilaterally. Previously seen right upper lobe nodule is not well appreciated on today's exam. IMPRESSION: No acute abnormality noted. Known right upper lobe nodule is not well appreciated on today's exam. Electronically Signed   By: Alcide Clever M.D.   On: 09/28/2022 19:35    Pending Labs Unresulted Labs (From admission, onward)     Start     Ordered   09/30/22 0500  CBC  Daily,   R      09/29/22 0300   09/29/22 2330  Heparin level (unfractionated)  Once-Timed,   TIMED        09/29/22 1747   09/29/22 0350  Hemoglobin A1c  Once,   R       Comments: To assess prior  glycemic control    09/29/22 0349            Vitals/Pain Today's Vitals   09/29/22 1645 09/29/22 1700 09/29/22 1800 09/29/22 1815  BP: (!) 121/59 132/78 115/81 112/75  Pulse: 99 87 (!) 107 98  Resp: 11 18 (!) 22 11  Temp: 97.9 F (36.6 C)     TempSrc:      SpO2: 100% 95% 100% 100%  Weight:      Height:      PainSc:        Isolation Precautions No active isolations  Medications Medications  insulin glargine-yfgn (SEMGLEE) injection 12 Units (6 Units Subcutaneous Given 09/29/22 0953)  insulin aspart (novoLOG) injection 0-9 Units (1 Units Subcutaneous Given 09/29/22 1744)  ondansetron (ZOFRAN) injection 4 mg (4 mg Intravenous Given 09/29/22 1744)  pantoprazole (PROTONIX) injection 40 mg (40 mg Intravenous Given 09/29/22 0544)  mometasone-formoterol (DULERA) 100-5 MCG/ACT inhaler 2 puff (2 puffs Inhalation Given 09/29/22 0954)  fluticasone (FLONASE) 50 MCG/ACT nasal spray 1 spray (has no administration in time range)  nitroGLYCERIN (NITROSTAT) SL tablet 0.4 mg (has no administration in time range)  morphine (PF) 2 MG/ML injection 1 mg (1 mg Intravenous Given 09/29/22 1744)  oxyCODONE-acetaminophen (PERCOCET/ROXICET) 5-325 MG per tablet 1 tablet (1 tablet Oral Given 09/29/22 1745)  heparin ADULT infusion 100 units/mL (25000 units/216mL) (1,050 Units/hr Intravenous New Bag/Given 09/29/22 1730)  oxyCODONE-acetaminophen (PERCOCET/ROXICET) 5-325 MG per tablet 1 tablet (1 tablet Oral Given 09/28/22 2136)  aspirin chewable tablet 81 mg (81 mg Oral Given 09/28/22 2240)  iohexol (OMNIPAQUE) 350 MG/ML injection 80 mL (80 mLs Intravenous Contrast Given 09/28/22 2206)  ondansetron (ZOFRAN-ODT) disintegrating tablet 4 mg (4 mg Oral Given 09/28/22 2241)  potassium chloride SA (KLOR-CON M) CR tablet 40 mEq (40 mEq Oral Given 09/29/22 0018)  aspirin chewable tablet 243 mg (243 mg Oral Given 09/29/22 0114)  magnesium sulfate IVPB 2 g 50 mL (0 g Intravenous Stopped 09/29/22 0231)  heparin bolus via  infusion 4,000 Units (4,000 Units Intravenous Bolus from Bag 09/29/22 0234)  heparin bolus via infusion 2,000 Units (2,000 Units Intravenous Bolus from Bag 09/29/22 1731)    Mobility walks

## 2022-09-29 NOTE — ED Notes (Signed)
Patient returned from [facility] procedure by [facility].

## 2022-09-29 NOTE — Progress Notes (Signed)
ANTICOAGULATION CONSULT NOTE - Initial Consult  Pharmacy Consult for heparin Indication: chest pain/ACS  Allergies  Allergen Reactions   Amoxil [Amoxicillin] Hives    Has patient had a PCN reaction causing immediate rash, facial/tongue/throat swelling, SOB or lightheadedness with hypotension: Yes Has patient had a PCN reaction causing severe rash involving mucus membranes or skin necrosis: No Has patient had a PCN reaction that required hospitalization No Has patient had a PCN reaction occurring within the last 10 years: Yes If all of the above answers are "NO", then may proceed with Cephalosporin use.   Chantix [Varenicline] Other (See Comments)    Nightmares Hallucinations     Suboxone [Buprenorphine Hcl-Naloxone Hcl] Nausea And Vomiting   Emend [Fosaprepitant Dimeglumine] Itching    See notes for transfusion on 8/23. Pt reported new onset of itching after Emend infusion was started. 25mg  Benadryl IV given per MD. Pt tolerated remaining infusion well.     Patient Measurements: Height: 5\' 7"  (170.2 cm) Weight: 78 kg (172 lb) IBW/kg (Calculated) : 61.6 Heparin Dosing Weight: 78kg  Vital Signs: Temp: 98.6 F (37 C) (09/17 2354) Temp Source: Oral (09/17 2354) BP: 122/91 (09/18 0121) Pulse Rate: 94 (09/18 0121)  Labs: Recent Labs    09/28/22 2030 09/28/22 2032 09/29/22 0121  HGB  --  9.5*  --   HCT  --  28.0*  --   PLT  --  161  --   HEPARINUNFRC  --   --  <0.10*  CREATININE  --  0.89  --   TROPONINIHS 183* 118*  --     Estimated Creatinine Clearance: 74.2 mL/min (by C-G formula based on SCr of 0.89 mg/dL).   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  s/p PCTA to dRCA and ostial RPAV-PLA vessel + PDA branch (05/2012)  b. [place] s/p DES to mLAD - Resolute DES  3.0 x 22 (3.42mm -->3.3 mm)   Bipolar depression (HCC)    Breast abscess    a. right side.    COPD (chronic obstructive pulmonary disease) (HCC)    Depression    Diabetic peripheral neuropathy (HCC)    Fibromyalgia    Genital warts    GERD (gastroesophageal reflux disease)    History of hiatal hernia    HLD (hyperlipidemia)    Hypertension    Lung cancer (HCC)    Pain in limb    a. LE VENOUS DUPLEX, 02/05/2009 - no evidence of deep vein thrombosis, Baker's cyst   Pneumonia    PTSD (post-traumatic stress disorder)    Sickle cell trait (HCC)    Tobacco abuse    Tooth caries    Type II diabetes mellitus (HCC)      Assessment: 57 y.o. female with a past history significant for triple bypass surgery, lung cancer, diabetes, hypertension presents today for evaluation of chest pain.  Symptom has been going on for about a week.  She also report palpitation and shortness of breath on exertion. Pharmacy to dose heparin drip for ACS/STEMI.  No prior AC noted  Hgb 9.5, plts 161, DDimer 3.8, trop 118  Goal of Therapy:  Heparin level 0.3-0.7 units/ml Monitor platelets by anticoagulation protocol: Yes   Plan:  Heparin bolus 4000 units x 1 Start heparin drip at 900 units/hr Heparin  level in 6 hours Daily CBC   Arley Phenix RPh 09/29/2022, 1:49 AM

## 2022-09-30 DIAGNOSIS — E8809 Other disorders of plasma-protein metabolism, not elsewhere classified: Secondary | ICD-10-CM | POA: Diagnosis not present

## 2022-09-30 DIAGNOSIS — R188 Other ascites: Secondary | ICD-10-CM | POA: Diagnosis not present

## 2022-09-30 DIAGNOSIS — K219 Gastro-esophageal reflux disease without esophagitis: Secondary | ICD-10-CM | POA: Diagnosis not present

## 2022-09-30 DIAGNOSIS — J449 Chronic obstructive pulmonary disease, unspecified: Secondary | ICD-10-CM | POA: Diagnosis not present

## 2022-09-30 DIAGNOSIS — E785 Hyperlipidemia, unspecified: Secondary | ICD-10-CM | POA: Diagnosis present

## 2022-09-30 DIAGNOSIS — I2489 Other forms of acute ischemic heart disease: Secondary | ICD-10-CM

## 2022-09-30 DIAGNOSIS — R0789 Other chest pain: Secondary | ICD-10-CM

## 2022-09-30 DIAGNOSIS — F319 Bipolar disorder, unspecified: Secondary | ICD-10-CM | POA: Diagnosis not present

## 2022-09-30 DIAGNOSIS — R079 Chest pain, unspecified: Secondary | ICD-10-CM | POA: Diagnosis not present

## 2022-09-30 DIAGNOSIS — E1165 Type 2 diabetes mellitus with hyperglycemia: Secondary | ICD-10-CM | POA: Diagnosis not present

## 2022-09-30 DIAGNOSIS — M7989 Other specified soft tissue disorders: Secondary | ICD-10-CM

## 2022-09-30 DIAGNOSIS — R112 Nausea with vomiting, unspecified: Secondary | ICD-10-CM | POA: Diagnosis not present

## 2022-09-30 DIAGNOSIS — E1142 Type 2 diabetes mellitus with diabetic polyneuropathy: Secondary | ICD-10-CM | POA: Diagnosis not present

## 2022-09-30 DIAGNOSIS — F17210 Nicotine dependence, cigarettes, uncomplicated: Secondary | ICD-10-CM | POA: Diagnosis present

## 2022-09-30 DIAGNOSIS — Z23 Encounter for immunization: Secondary | ICD-10-CM | POA: Diagnosis not present

## 2022-09-30 DIAGNOSIS — Z951 Presence of aortocoronary bypass graft: Secondary | ICD-10-CM

## 2022-09-30 DIAGNOSIS — D630 Anemia in neoplastic disease: Secondary | ICD-10-CM | POA: Diagnosis present

## 2022-09-30 DIAGNOSIS — D3501 Benign neoplasm of right adrenal gland: Secondary | ICD-10-CM | POA: Diagnosis present

## 2022-09-30 DIAGNOSIS — Z1152 Encounter for screening for COVID-19: Secondary | ICD-10-CM | POA: Diagnosis not present

## 2022-09-30 DIAGNOSIS — J90 Pleural effusion, not elsewhere classified: Secondary | ICD-10-CM | POA: Diagnosis not present

## 2022-09-30 DIAGNOSIS — R220 Localized swelling, mass and lump, head: Secondary | ICD-10-CM | POA: Diagnosis not present

## 2022-09-30 DIAGNOSIS — R0602 Shortness of breath: Secondary | ICD-10-CM | POA: Diagnosis not present

## 2022-09-30 DIAGNOSIS — E663 Overweight: Secondary | ICD-10-CM | POA: Diagnosis present

## 2022-09-30 DIAGNOSIS — I2510 Atherosclerotic heart disease of native coronary artery without angina pectoris: Secondary | ICD-10-CM | POA: Diagnosis not present

## 2022-09-30 DIAGNOSIS — C3491 Malignant neoplasm of unspecified part of right bronchus or lung: Secondary | ICD-10-CM | POA: Diagnosis not present

## 2022-09-30 DIAGNOSIS — I10 Essential (primary) hypertension: Secondary | ICD-10-CM | POA: Diagnosis present

## 2022-09-30 DIAGNOSIS — Z794 Long term (current) use of insulin: Secondary | ICD-10-CM | POA: Diagnosis not present

## 2022-09-30 DIAGNOSIS — E876 Hypokalemia: Secondary | ICD-10-CM | POA: Diagnosis not present

## 2022-09-30 DIAGNOSIS — R109 Unspecified abdominal pain: Secondary | ICD-10-CM | POA: Diagnosis not present

## 2022-09-30 DIAGNOSIS — I700 Atherosclerosis of aorta: Secondary | ICD-10-CM | POA: Diagnosis present

## 2022-09-30 DIAGNOSIS — D539 Nutritional anemia, unspecified: Secondary | ICD-10-CM | POA: Diagnosis not present

## 2022-09-30 DIAGNOSIS — K122 Cellulitis and abscess of mouth: Secondary | ICD-10-CM | POA: Diagnosis present

## 2022-09-30 DIAGNOSIS — I340 Nonrheumatic mitral (valve) insufficiency: Secondary | ICD-10-CM | POA: Diagnosis present

## 2022-09-30 LAB — CBC
HCT: 25.1 % — ABNORMAL LOW (ref 36.0–46.0)
Hemoglobin: 8.3 g/dL — ABNORMAL LOW (ref 12.0–15.0)
MCH: 35.8 pg — ABNORMAL HIGH (ref 26.0–34.0)
MCHC: 33.1 g/dL (ref 30.0–36.0)
MCV: 108.2 fL — ABNORMAL HIGH (ref 80.0–100.0)
Platelets: 170 10*3/uL (ref 150–400)
RBC: 2.32 MIL/uL — ABNORMAL LOW (ref 3.87–5.11)
RDW: 14.6 % (ref 11.5–15.5)
WBC: 6.9 10*3/uL (ref 4.0–10.5)
nRBC: 0 % (ref 0.0–0.2)

## 2022-09-30 LAB — CBC WITH DIFFERENTIAL/PLATELET

## 2022-09-30 LAB — COMPREHENSIVE METABOLIC PANEL
ALT: 17 U/L (ref 0–44)
AST: 23 U/L (ref 15–41)
Albumin: 2.6 g/dL — ABNORMAL LOW (ref 3.5–5.0)
Alkaline Phosphatase: 57 U/L (ref 38–126)
Anion gap: 8 (ref 5–15)
BUN: 10 mg/dL (ref 6–20)
CO2: 26 mmol/L (ref 22–32)
Calcium: 8 mg/dL — ABNORMAL LOW (ref 8.9–10.3)
Chloride: 99 mmol/L (ref 98–111)
Creatinine, Ser: 0.69 mg/dL (ref 0.44–1.00)
GFR, Estimated: >60 mL/min (ref >60)
Glucose, Bld: 175 mg/dL — ABNORMAL HIGH (ref 70–99)
Potassium: 3.5 mmol/L (ref 3.5–5.1)
Sodium: 133 mmol/L — ABNORMAL LOW (ref 135–145)
Total Bilirubin: 1 mg/dL (ref 0.3–1.2)
Total Protein: 6.1 g/dL — ABNORMAL LOW (ref 6.5–8.1)

## 2022-09-30 LAB — SARS CORONAVIRUS 2 BY RT PCR: SARS Coronavirus 2 by RT PCR: NEGATIVE

## 2022-09-30 LAB — DIFFERENTIAL
Abs Immature Granulocytes: 0.08 10*3/uL — ABNORMAL HIGH (ref 0.00–0.07)
Basophils Absolute: 0 10*3/uL (ref 0.0–0.1)
Basophils Relative: 0 %
Eosinophils Absolute: 0 10*3/uL (ref 0.0–0.5)
Eosinophils Relative: 0 %
Immature Granulocytes: 1 %
Lymphocytes Relative: 14 %
Lymphs Abs: 1 10*3/uL (ref 0.7–4.0)
Monocytes Absolute: 0.9 10*3/uL (ref 0.1–1.0)
Monocytes Relative: 13 %
Neutro Abs: 4.9 10*3/uL (ref 1.7–7.7)
Neutrophils Relative %: 72 %

## 2022-09-30 LAB — GLUCOSE, CAPILLARY
Glucose-Capillary: 95 mg/dL (ref 70–99)
Glucose-Capillary: 97 mg/dL (ref 70–99)
Glucose-Capillary: 130 mg/dL — ABNORMAL HIGH (ref 70–99)
Glucose-Capillary: 104 mg/dL — ABNORMAL HIGH (ref 70–99)
Glucose-Capillary: 156 mg/dL — ABNORMAL HIGH (ref 70–99)

## 2022-09-30 LAB — HEMOGLOBIN A1C
Hgb A1c MFr Bld: 5.4 % (ref 4.8–5.6)
Mean Plasma Glucose: 108 mg/dL

## 2022-09-30 LAB — HEPARIN LEVEL (UNFRACTIONATED)
Heparin Unfractionated: 0.47 [IU]/mL (ref 0.30–0.70)
Heparin Unfractionated: 0.49 IU/mL (ref 0.30–0.70)

## 2022-09-30 LAB — MAGNESIUM: Magnesium: 1.7 mg/dL (ref 1.7–2.4)

## 2022-09-30 LAB — PHOSPHORUS: Phosphorus: 2.6 mg/dL (ref 2.5–4.6)

## 2022-09-30 MED ORDER — FERROUS SULFATE 325 (65 FE) MG PO TABS
325.0000 mg | ORAL_TABLET | Freq: Every day | ORAL | Status: DC
  Administered 2022-10-01: 325 mg via ORAL
  Filled 2022-09-30: qty 1

## 2022-09-30 MED ORDER — NICOTINE POLACRILEX 2 MG MT GUM
2.0000 mg | CHEWING_GUM | OROMUCOSAL | Status: DC | PRN
  Filled 2022-09-30: qty 1

## 2022-09-30 MED ORDER — SENNOSIDES-DOCUSATE SODIUM 8.6-50 MG PO TABS
1.0000 | ORAL_TABLET | Freq: Two times a day (BID) | ORAL | Status: DC
  Administered 2022-09-30 – 2022-10-01 (×3): 1 via ORAL
  Filled 2022-09-30 (×3): qty 1

## 2022-09-30 MED ORDER — PROCHLORPERAZINE MALEATE 10 MG PO TABS: 10.0000 mg | ORAL_TABLET | Freq: Four times a day (QID) | ORAL | Status: DC | PRN

## 2022-09-30 MED ORDER — DULOXETINE HCL 30 MG PO CPEP
30.0000 mg | ORAL_CAPSULE | Freq: Every day | ORAL | Status: DC
  Administered 2022-09-30 – 2022-10-01 (×2): 30 mg via ORAL
  Filled 2022-09-30 (×2): qty 1

## 2022-09-30 MED ORDER — LORAZEPAM 0.5 MG PO TABS: 0.5000 mg | ORAL_TABLET | Freq: Three times a day (TID) | ORAL | Status: DC | PRN

## 2022-09-30 MED ORDER — ORAL CARE MOUTH RINSE: 15.0000 mL | OROMUCOSAL | Status: DC | PRN

## 2022-09-30 MED ORDER — OXYCODONE HCL 5 MG PO TABS
10.0000 mg | ORAL_TABLET | ORAL | Status: DC | PRN
  Administered 2022-09-30 – 2022-10-01 (×4): 10 mg via ORAL
  Filled 2022-09-30 (×4): qty 2

## 2022-09-30 MED ORDER — MEGESTROL ACETATE 20 MG PO TABS
20.0000 mg | ORAL_TABLET | Freq: Two times a day (BID) | ORAL | Status: DC
  Administered 2022-09-30: 20 mg via ORAL
  Filled 2022-09-30 (×4): qty 1

## 2022-09-30 MED ORDER — FUROSEMIDE 10 MG/ML IJ SOLN
40.0000 mg | Freq: Once | INTRAMUSCULAR | Status: AC
  Administered 2022-09-30: 40 mg via INTRAVENOUS
  Filled 2022-09-30: qty 4

## 2022-09-30 MED ORDER — ATORVASTATIN CALCIUM 40 MG PO TABS
40.0000 mg | ORAL_TABLET | Freq: Every day | ORAL | Status: DC
  Administered 2022-09-30 – 2022-10-01 (×2): 40 mg via ORAL
  Filled 2022-09-30 (×2): qty 1

## 2022-09-30 MED ORDER — ASPIRIN 81 MG PO TBEC
81.0000 mg | DELAYED_RELEASE_TABLET | Freq: Every day | ORAL | Status: DC
  Administered 2022-09-30 – 2022-10-01 (×2): 81 mg via ORAL
  Filled 2022-09-30 (×2): qty 1

## 2022-09-30 MED ORDER — BUSPIRONE HCL 5 MG PO TABS
7.5000 mg | ORAL_TABLET | Freq: Two times a day (BID) | ORAL | Status: DC
  Administered 2022-09-30 – 2022-10-01 (×2): 7.5 mg via ORAL
  Filled 2022-09-30 (×2): qty 2

## 2022-09-30 MED ORDER — OXYCODONE HCL ER 15 MG PO T12A
15.0000 mg | EXTENDED_RELEASE_TABLET | Freq: Two times a day (BID) | ORAL | Status: DC
  Administered 2022-09-30 – 2022-10-01 (×3): 15 mg via ORAL
  Filled 2022-09-30 (×3): qty 1

## 2022-09-30 MED ORDER — FOLIC ACID 1 MG PO TABS
1.0000 mg | ORAL_TABLET | Freq: Every day | ORAL | Status: DC
  Administered 2022-09-30 – 2022-10-01 (×2): 1 mg via ORAL
  Filled 2022-09-30 (×2): qty 1

## 2022-09-30 MED ORDER — PANTOPRAZOLE SODIUM 40 MG PO TBEC
40.0000 mg | DELAYED_RELEASE_TABLET | Freq: Two times a day (BID) | ORAL | Status: DC
  Administered 2022-09-30 – 2022-10-01 (×2): 40 mg via ORAL
  Filled 2022-09-30 (×2): qty 1

## 2022-09-30 MED ORDER — FUROSEMIDE 40 MG PO TABS: 40.0000 mg | ORAL_TABLET | Freq: Every day | ORAL | Status: DC | PRN

## 2022-09-30 MED ORDER — POLYETHYLENE GLYCOL 3350 17 G PO PACK
17.0000 g | PACK | Freq: Two times a day (BID) | ORAL | Status: DC
  Administered 2022-09-30 – 2022-10-01 (×3): 17 g via ORAL
  Filled 2022-09-30 (×3): qty 1

## 2022-09-30 MED ORDER — ENOXAPARIN SODIUM 40 MG/0.4ML IJ SOSY
40.0000 mg | PREFILLED_SYRINGE | INTRAMUSCULAR | Status: DC
  Administered 2022-09-30: 40 mg via SUBCUTANEOUS
  Filled 2022-09-30: qty 0.4

## 2022-09-30 MED ORDER — CARVEDILOL 12.5 MG PO TABS
12.5000 mg | ORAL_TABLET | Freq: Two times a day (BID) | ORAL | Status: DC
  Administered 2022-10-01 (×2): 12.5 mg via ORAL
  Filled 2022-09-30 (×2): qty 1

## 2022-09-30 MED ORDER — SODIUM CHLORIDE 0.9% FLUSH: 10.0000 mL | INTRAVENOUS | Status: DC | PRN

## 2022-09-30 NOTE — Evaluation (Signed)
Physical Therapy Evaluation Patient Details Name: Cassidy [name] MRN: [id number] DOB: 09-10-1965 Today's Date: 09/30/2022  History of Present Illness  pt is 57 y.o admit wiht complaints of chest pain and palpapations, with increased nauseousnes and emesis. PMH: stage IV lung cancer , receiving chemo, CABG x3 06/2021, DM2, COPD, CAD, and HTN PTSD, depression  Clinical Impression  Pt with muscle atrophy and generalized weakness noted more significant in BLEs ( see MMT below) pt HR while sitting at 105 bpm with no complaints of symptoms, however with little exercise in supine pt had 1 episode where she felt like heart was "pounding" with flutter in her throat and shortness of breathe. It resolves in about 30 seconds to 1 minute. This occurred 3 times while ambulating when HR was also at 125 during these episodes.   Pt also report numbness in BLEs in front of hip area sn lower legs after walking for about 1-2 minutes with slight dizziness. Pt has BLE lower leg swelling with moderate pitting edema and swelling. R is more in ankle lower leg and Left is more in top of foot and ankle.   Pt did report that walking today was much better than she has been over the past several days, so that is improving a bit.   She also has great swelling ( possible abscess) in Left lower jaw stating this started about 2 days before she came to hospital. It is very painful .   Recommend HHPT at this time, and will have mobility specialist continue to work with pt while here with her ambulation and  HEP.      Access Code [email or web address]  Access Code: [email or web address] URL: [email or web address] Date: 09/30/2022 Prepared by: [employer]  Program Notes Do these with BOTH legs ( at separate times) SLOW and controlled. Take breaks if need.   Exercises - Supine Ankle Pumps  - 2 x daily - 7 x weekly - 1 sets - 10 reps - Supine Quadricep Sets  - 2 x daily - 7 x weekly - 1 sets - 10 reps - Supine Heel Slide  - 2 x  daily - 7 x weekly - 1 sets - 10 reps - Supine March  - 2 x daily - 7 x weekly - 1 sets - 10 reps - Supine Active Straight Leg Raise  - 2 x daily - 7 x weekly - 1 sets - 10 reps - Supine Quad Set  - 1 x daily - 7 x weekly - 3 sets - 10 reps - Seated Long Arc Quad  - 1 x daily - 7 x weekly - 3 sets - 10 reps - Seated Single Leg Hip Abduction  - 1 x daily - 7 x weekly - 3 sets - 10 reps - Seated Elbow Flexion with Resistance  - 3 x daily - 7 x weekly - 1 sets - 10 reps - Seated Elbow Extension with Self-Anchored Resistance  - 3 x daily - 7 x weekly - 1 sets - 10 reps - Seated Shoulder Horizontal Abduction with Resistance  - 3 x daily - 7 x weekly - 1 sets - 10 reps   If plan is discharge home, recommend the following: A little help with walking and/or transfers;A little help with bathing/dressing/bathroom;Assist for transportation   Can travel by private vehicle        Equipment Recommendations None recommended by PT  Recommendations for Other Services       Functional Status Assessment Patient has had a recent  decline in their functional status and demonstrates the ability to make significant improvements in function in a reasonable and predictable amount of time.     Precautions / Restrictions Precautions Precautions: None Restrictions Weight Bearing Restrictions: No      Mobility  Bed Mobility Overal bed mobility: Modified Independent                  Transfers Overall transfer level: Modified independent                      Ambulation/Gait Ambulation/Gait assistance: Contact guard assist Gait Distance (Feet): 180 Feet (3 standing rest breaks due to heart palpations and shortness of breathe) Assistive device: Rolling walker (2 wheels) Gait Pattern/deviations: Step-through pattern       General Gait Details: slow, steady           Balance Overall balance assessment: Modified Independent                                            Pertinent Vitals/Pain Pain Assessment Pain Assessment: Faces Pain Score: 4  (when Left chest area is palpated along scar) Faces Pain Scale: Hurts little more Pain Location: " all over" I am a cancer patient and hurt sometimes all over , but also in my chest area. Pain Descriptors / Indicators: Aching, Sharp Pain Intervention(s): Monitored during session    Home Living Family/patient expects to be discharged to:: Private residence Living Arrangements: Alone Available Help at Discharge: Family;Friend(s) Type of Home: House Home Access: Stairs to enter Entrance Stairs-Rails: Doctor, general practice of Steps: 4+10   Home Layout: One level Home Equipment: Rollator (4 wheels);Rolling Walker (2 wheels) Additional Comments: pt has great friend who will help her as needed and son as well    Prior Function Prior Level of Function : Independent/Modified Independent             Mobility Comments: pt is independent and wants to stay that way as long as possible. Last few days , week has been difficult becaseu she has not been able to even dres herself without SOB and heart paplations       Extremity/Trunk Assessment        Lower Extremity Assessment Lower Extremity Assessment: Generalized weakness (only able to do 3 SLR in a row, has weakness in LEs, ankle tight with swelling and B pitting edema, but able to range WNL.)       Communication   Communication Communication: No apparent difficulties  Cognition Arousal: Alert Behavior During Therapy: WFL for tasks assessed/performed Overall Cognitive Status: Within Functional Limits for tasks assessed                                          General Comments      Exercises Other Exercises Other Exercises: see exercises listed above.. in Medbridge program   Assessment/Plan    PT Assessment All further PT needs can be met in the next venue of care (transition to mobility Team)  PT Problem  List Decreased strength;Decreased activity tolerance       PT Treatment Interventions      PT Goals (Current goals can be found in the Care Plan section)  Acute Rehab PT Goals Patient Stated Goal: wants to  get stronger and be able to stay independent PT Goal Formulation: All assessment and education complete, DC therapy (for this venue)     AM-PAC PT "6 Clicks" Mobility  Outcome Measure Help needed turning from your back to your side while in a flat bed without using bedrails?: None Help needed moving from lying on your back to sitting on the side of a flat bed without using bedrails?: None Help needed moving to and from a bed to a chair (including a wheelchair)?: None Help needed standing up from a chair using your arms (e.g., wheelchair or bedside chair)?: A Little Help needed to walk in hospital room?: A Little Help needed climbing 3-5 steps with a railing? : A Little 6 Click Score: 21    End of Session Equipment Utilized During Treatment: Gait belt Activity Tolerance: Patient tolerated treatment well Patient left: in bed;with call bell/phone within reach;with family/visitor present Nurse Communication: Mobility status PT Visit Diagnosis: Muscle weakness (generalized) (M62.81)    Time: 1600-1640 PT Time Calculation (min) (ACUTE ONLY): 40 min   Charges:   PT Evaluation $PT Eval Low Complexity: 1 Low PT Treatments $Gait Training: 8-22 mins PT General Charges $$ ACUTE PT VISIT: 1 Visit         Zayed Griffie, PT, MPT Acute Rehabilitation Services Office: [phone] If a weekend: secure chat groups: [facility] PT, [facility] OT, [facility] SLP 09/30/2022   Lillyrose Reitan, [facility] 09/30/2022, 5:32 PM

## 2022-09-30 NOTE — TOC Initial Note (Signed)
Transition of Care [facility]) - Initial/Assessment Note    Patient Details  Name: Cassidy [name] MRN: [id number] Date of Birth: 1965-05-06  [facility] CM/SW Contact:    Howell Rucks, RN Phone Number: 09/30/2022, 12:39 PM  Clinical Narrative: TOC met with pt at bedside to introduce role of TOC/NCM and review for dc planning. Pt reports she has a PCP and pharmacy in place, no current home home services, pt reports she has walker at home she uses as needed. Pt reports she lives alone with support from her neighbors and from her son and her  brother, confirms she has transportation available at discharge.  TOC will continue to follow.                  Expected Discharge Plan: Home/Self Care Barriers to Discharge: Continued Medical Work up   Patient Goals and CMS Choice Patient states their goals for this hospitalization and ongoing recovery are:: return home with support from family/neighbors          Expected Discharge Plan and Services       Living arrangements for the past 2 months: Apartment                                      Prior Living Arrangements/Services Living arrangements for the past 2 months: Apartment Lives with:: Self Patient language and need for interpreter reviewed:: Yes Do you feel safe going back to the place where you live?: Yes      Need for Family Participation in Patient Care: Yes (Comment) Care giver support system in place?: Yes (comment) Current home services: DME (walker) Criminal Activity/Legal Involvement Pertinent to Current Situation/Hospitalization: No - Comment as needed  Activities of Daily Living Home Assistive Devices/Equipment: Walker (specify type) ADL Screening (condition at time of admission) Patient's cognitive ability adequate to safely complete daily activities?: Yes Is the patient deaf or have difficulty hearing?: No Does the patient have difficulty seeing, even when wearing glasses/contacts?: No Does  the patient have difficulty concentrating, remembering, or making decisions?: No Patient able to express need for assistance with ADLs?: Yes Does the patient have difficulty dressing or bathing?: No Independently performs ADLs?: Yes (appropriate for developmental age) Does the patient have difficulty walking or climbing stairs?: Yes Weakness of Legs: Left Weakness of Arms/Hands: None  Permission Sought/Granted Permission sought to share information with : Case Manager Permission granted to share information with : Yes, Verbal Permission Granted  Share Information with NAME: Fannie Knee, RN           Emotional Assessment Appearance:: Appears stated age Attitude/Demeanor/Rapport: Gracious Affect (typically observed): Accepting Orientation: : Oriented to Self, Oriented to Place, Oriented to  Time, Oriented to Situation Alcohol / Substance Use: Not Applicable Psych Involvement: No (comment)  Admission diagnosis:  NSTEMI (non-ST elevated myocardial infarction) (HCC) [I21.4] Chest pain [R07.9] Chest pain, unspecified type [R07.9] Patient Active Problem List   Diagnosis Date Noted   Chest pain 09/29/2022   Nausea and vomiting 09/29/2022   Pleural effusion 09/29/2022   Hypokalemia 09/29/2022   Hypomagnesemia 09/29/2022   Demand ischemia of myocardium 09/29/2022   Anasarca 08/25/2022   Chronic idiopathic constipation 06/25/2022   Depression 03/10/2022   Port-A-Cath in place 02/17/2022   Need for influenza vaccination 10/27/2021   Urinary frequency 09/07/2021   Encounter for antineoplastic chemotherapy 09/02/2021   Encounter for antineoplastic immunotherapy 09/02/2021  Adenocarcinoma of right lung, stage 4 (HCC) 08/26/2021   Lung mass 08/20/2021   S/P CABG (coronary artery bypass graft) 06/30/2021   Shortness of breath 06/03/2021   Depressed mood 05/15/2021   Abdominal pain 07/21/2020   Type 2 diabetes mellitus (HCC) 06/11/2020   Back pain 06/11/2020   Anxiety 05/22/2020    Angina pectoris (HCC) 05/22/2020   Blood clotting disorder (HCC) 06/21/2019   Atherosclerotic heart disease of native coronary artery with other forms of angina pectoris (HCC) 06/21/2019   Obstructive chronic bronchitis with exacerbation (HCC)    Hyperlipidemia with target LDL less than 70    Fibromyalgia    PTSD (post-traumatic stress disorder)    CAD S/P percutaneous coronary angioplasty 11/02/2013   Leiomyoma of uterus, unspecified 07/20/2012   Essential hypertension 05/26/2012   Tobacco abuse 05/26/2012   Unstable angina pectoris (HCC) 05/26/2012   CARPAL TUNNEL SYNDROME, LEFT 12/18/2009   TINEA VERSICOLOR 04/06/2007   BIPOLAR I D/O MOST RECENT EPIS DEPRESSED MOD 11/24/2006   HERPES, GENITAL NEC 09/30/2006   SUBSTANCE ABUSE 09/30/2006   GERD 09/30/2006   PLANTAR FASCIITIS 09/30/2006   PCP:  [name], MD Pharmacy:   [facility] - [place], [place] - [address] [place] [address] Phone: [phone] Fax: [phone]  [id number] - [place], [place] - [address] AT [facility] [address] [facility] [address] [place] [address] Phone: [phone] Fax: [phone]  [id number] - [place] - [address] [place] [address] Phone: [phone] Fax: [phone]     Social Determinants of Health (SDOH) Social History: SDOH Screenings   Food Insecurity: No Food Insecurity (09/29/2022)  Housing: Low Risk  (09/29/2022)  Transportation Needs: No Transportation Needs (09/29/2022)  Utilities: Not At Risk (09/29/2022)  Depression (PHQ2-9): Low Risk  (07/08/2022)  Financial Resource Strain: High Risk (09/08/2021)  Tobacco Use: Medium Risk (09/28/2022)   SDOH Interventions:     Readmission Risk Interventions    09/30/2022   12:37 PM  Readmission Risk Prevention Plan  Transportation Screening Complete  PCP or Specialist  Appt within 3-5 Days Complete  HRI or Home Care Consult Complete  Social Work Consult for Recovery Care Planning/Counseling Complete  Palliative Care Screening Not Applicable  Medication Review Oceanographer) Complete

## 2022-09-30 NOTE — Progress Notes (Signed)
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  On arrival to the ED she was tachycardic in the 110s however she was afebrile and labs showed no leukocytosis.  Basic labs were done and she was noted to be hypokalemic and hypomagnesemic and troponin was elevated as well as a D-dimer of 3.81.  CTA of the chest PE protocol was obtained an showed no evidence for PE but did show the 13 mm spiculated lesion in the right upper lobe which was decreased in size from prior exam from where it was 15 mm.  She also had a small left pleural effusion.  EKG was done and showed sinus tachycardia with no acute ischemic changes.  The ED PA discussed with on-call cardiology team recommended starting IV heparin and trending troponin.  Cardiology team felt there is no urgent need to transfer her to [facility].  She was admitted to [facility] and was given aspirin, Zofran, Percocet, oral potassium, IV magnesium and started on IV heparin.  The patient had described the chest discomfort and dyspnea on exertion worsening for the last week.  She describes a chest pain as being substernal/left-sided and described as a squeezing sensation but felt that the palpitations were the worst.  She continued with intermittent episodes over the week and felt very dyspneic and short of breath even walking small distances.  She reports chronic bilateral lower extremity  edema which is slightly worsened.  Also had some intermittent nausea and vomiting related to her cancer treatment for the past 3 days has been constantly vomiting and has not been able to take anything p.o.  Constipated and not had a bowel movement for 4 days.  Of note she reports generalized abdominal discomfort and a history of intra-abdominal cancer with metastasis which she is taking pain medications at home for.  Currently she had recurrent chest discomfort and palpitations with cardiology has evaluated and recommending pursuit for further workup despite their suspicion for ACS being on the lower side.  They feel that the course of action is to identify potential ischemic etiologies and obtaining a Myoview.  Echocardiogram has already been done and pending read.  Currently she is being admitted treated for the following but not limited to:  Assessment and Plan:  Chest Pain r/o ACS History of CAD status post CABG and PCI -CTA PE study negative.   -EKG without acute ischemic changes.  Troponin 183> 118>192 and is now 173.   -Was having Chest discomfort this AM but Not having active chest pain at this time.   -She was given aspirin in the ED.  Resumed ASA, Statin, and NTG -Continue cardiac monitoring, IV heparin now stopped and trend troponin.  -Echocardiogram done and showed "Left ventricular ejection fraction, by estimation, is 60 to 65%. The left ventricle has normal function. The left ventricle has no regional wall motion abnormalities. There is mild concentric left ventricular hypertrophy. Left ventricular diastolic parameters are consistent with Grade I diastolic dysfunction (impaired relaxation).  Right ventricular systolic function is moderately reduced. The right ventricular size is not well visualized. There is prominent systolic anterior motion of the mitral valve, but there is no LV outflow obstruction (Valsalva maneuver was not performed). The mitral valve is normal in structure. Mild mitral  valve regurgitation. No evidence of mitral stenosis. The aortic valve is normal in structure. Aortic valve regurgitation is not visualized. No aortic stenosis is present. The inferior vena cava is normal in size with greater than 50% respiratory variability, suggesting right atrial pressure of 3 mmHg." -Cardiology consulted and recommending Myoview which was done and showed 'No reversible ischemia. Fixed defect in the inferolateral wall suggest prior infarction. Normal left ventricular wall motion. Left ventricular ejection fraction 67%. Non invasive risk stratification*: Low"  Palpitations and Tachycardia -Resume BB with Carvedilol 12.5 mg po BID -Continue to Monitor on Telemetry -C/w Ambulation as it could be related to Deconditioning -May consider 30 Day Holter to evaluate -C/w PT/OT   Generalized Weakness and Deconditioning -C/w PT/OT   Generalized abdominal pain, nausea, vomiting, improving  -Stat CT abdomen pelvis ordered and done and showed "Small to moderate volume of ascites, slightly increased compared to the prior examination. There is also mild mesenteric edema and diffuse body wall edema along with bilateral pleural effusions; imaging findings that suggest a state of anasarca. Biliary sludge lying dependently within the gallbladder, without definite findings to suggest an acute cholecystitis. Low-attenuation lesion in the central aspect of segment 7 of the liver grossly unchanged compared to prior studies, likely benign (potentially a cavernous hemangioma).  Aortic atherosclerosis, in addition to least 2 vessel coronary artery disease. Please note that although the presence of coronary artery calcium documents the presence of coronary artery disease, the severity of this disease and any potential stenosis cannot be assessed on this non-gated CT examination. Assessment for potential risk factor modification, dietary therapy or pharmacologic therapy may be warranted, if clinically  indicated. Small right adrenal adenoma, stable. Additional incidental findings, as above" -Continue antiemetic as needed and resume Metaclopramide if necessary  -Since she has a family member + for COVID will check since she never was checked on Admission  Left Lower Jaw Swelling -Check Maxillofacial CT   Mild hypokalemia and hypomagnesemia -Likely due to vomiting/poor p.o. intake. -Electrolyte Trend: Recent Labs  Lab 09/15/22 1031 09/28/22 2032 09/28/22 2243 09/29/22 0613 09/30/22 1027  K 3.6 3.2*  --  3.6 3.5  MG  --   --    < > 1.9 1.7   < > = values in this interval not displayed.  -Continue to Monitor and Trend and Replete as Necessary -Repeat CMP and Mag in the AM   Stage IV non-small cell lung cancer -Currently on chemotherapy.  Outpatient oncology follow-up and plans to discuss stopping Chemotherapy with her Primary Oncologist Dr. Bradd Canary left pleural effusion -Seen on CT.  Possibly related to malignancy.  Check BNP -Repeat CXR in the AM    Macrocytic Anemia -In the setting of cancer treatment/chemotherapy.  -Hgb/Hct Trend: Recent Labs  Lab 09/15/22 1031 09/28/22 2032 09/29/22 1007 09/30/22 0826 09/30/22 0829  HGB 9.4* 9.5* 8.0* DUP RN DANA 8.3*  HCT 27.2* 28.0* 24.0* DUP RN DANA 25.1*  MCV 106.7* 104.9* 105.3* DUP RN DANA 108.2*  -Hemoglobin is stable, continue to monitor labs. -Check Anemia Panel in the AM -Continue to Monitor for S/Sx of Bleeding; NO overt bleeding noted -Repeat CBC in the AM   Bilateral LE Swelling -Check LE Duplex given  her Elevated D-Dimer of 3.81 -Give IV Lasix 40 mg x1   COPD -Stable, no signs of acute exacerbation.  Continue home inhaler.   GERD/GI Prophylaxis -IV Protonix 40 mg daily changed to Pantoprazole 40 mg po BID   Hypertension -Stable.  Resumed home medication with Carvedilol 12.5 mg po BID -Continue to Monitor BP per Procol  Hyperlipidemia -Resumed Atorvastatin 40 mg po Daily    Insulin-Dependent  Type 2 Diabetes -A1c 6.8 in June 2023, repeat ordered.  Glucose 181.   -Continue home long-acting insulin at 1/2 of usual dose given vomiting/poor p.o. intake.  Semglee 12 units daily ordered.  -Sensitive sliding scale insulin every 4 hours. -CBG and Glucose Trend: Recent Labs  Lab 09/29/22 1647 09/29/22 2016 09/29/22 2341 09/30/22 0338 09/30/22 0801 09/30/22 1225 09/30/22 1816  GLUCAP 147* 175* 136* 95 97 130* 104*   Recent Labs  Lab 09/15/22 1031 09/28/22 2032 09/29/22 0613 09/30/22 1027  GLUCOSE 180* 181* 158* 175*   Constipation -Bowel Regimen initiated and will be continuedDVT prophylaxis: enoxaparin (LOVENOX) injection 40 mg Start: 09/30/22 2200Enoxaparin 40 mg sq24h    Code Status: Full Code Family Communication: No family present at bedside  Disposition Plan:  Level of care: Progressive Status is: Inpatient Remains inpatient appropriate because: Needs further clinical improvement and workup for LE swelling and Facial Swelling   Consultants:  Cardiology   Procedures:  ECHOCARDIOGRAM IMPRESSIONS     1. Left ventricular ejection fraction, by estimation, is 60 to 65%. The  left ventricle has normal function. The left ventricle has no regional  wall motion abnormalities. There is mild concentric left ventricular  hypertrophy. Left ventricular diastolic  parameters are consistent with Grade I diastolic dysfunction (impaired  relaxation).   2. Right ventricular systolic function is moderately reduced. The right  ventricular size is not well visualized.   3. There is prominent systolic anterior motion of the mitral valve, but  there is no LV outflow obstruction (Valsalva maneuver was not performed).  The mitral valve is normal in structure. Mild mitral valve regurgitation.  No evidence of mitral stenosis.   4. The aortic valve is normal in structure. Aortic valve regurgitation is  not visualized. No aortic stenosis is present.   5. The inferior vena cava is  normal in size with greater than 50%  respiratory variability, suggesting right atrial pressure of 3 mmHg.   FINDINGS   Left Ventricle: Left ventricular ejection fraction, by estimation, is 60  to 65%. The left ventricle has normal function. The left ventricle has no  regional wall motion abnormalities. The left ventricular internal cavity  size was normal in size. There is   mild concentric left ventricular hypertrophy. Abnormal (paradoxical)  septal motion, consistent with left bundle branch block. Left ventricular  diastolic parameters are consistent with Grade I diastolic dysfunction  (impaired relaxation). Normal left  ventricular filling pressure.   Right Ventricle: The right ventricular size is not well visualized. No  increase in right ventricular wall thickness. Right ventricular systolic  function is moderately reduced.   Left Atrium: Left atrial size was normal in size.   Right Atrium: Right atrial size was normal in size.   Pericardium: There is no evidence of pericardial effusion.   Mitral Valve: There is prominent systolic anterior motion of the mitral  valve, but there is no LV outflow obstruction (Valsalva maneuver was not  performed). The mitral valve is normal in structure. Mild mitral valve  regurgitation. No evidence of mitral  valve stenosis.  Tricuspid Valve: The tricuspid valve is normal in structure. Tricuspid  valve regurgitation is not demonstrated. No evidence of tricuspid  stenosis.   Aortic Valve: The aortic valve is normal in structure. Aortic valve  regurgitation is not visualized. No aortic stenosis is present. Aortic  valve mean gradient measures 6.4 mmHg. Aortic valve peak gradient measures  13.4 mmHg. Aortic valve area, by VTI  measures 2.67 cm.   Pulmonic Valve: The pulmonic valve was normal in structure. Pulmonic valve  regurgitation is not visualized. No evidence of pulmonic stenosis.   Aorta: The aortic root is normal in size and  structure.   Venous: The inferior vena cava is normal in size with greater than 50%  respiratory variability, suggesting right atrial pressure of 3 mmHg.   IAS/Shunts: No atrial level shunt detected by color flow Doppler.   LEFT VENTRICLE  PLAX 2D  LVIDd:         4.30 cm   Diastology  LVIDs:         2.90 cm   LV e' medial:    5.44 cm/s  LV PW:         1.30 cm   LV E/e' medial:  8.0  LV IVS:        1.30 cm   LV e' lateral:   5.87 cm/s  LVOT diam:     2.00 cm   LV E/e' lateral: 7.4  LV SV:         72  LV SV Index:   38  LVOT Area:     3.14 cm     RIGHT VENTRICLE         IVC  TAPSE (M-mode): 0.8 cm  IVC diam: 1.20 cm   LEFT ATRIUM             Index        RIGHT ATRIUM           Index  LA diam:        2.90 cm 1.53 cm/m   RA Area:     10.90 cm  LA Vol (A2C):   36.6 ml 19.30 ml/m  RA Volume:   22.10 ml  11.65 ml/m  LA Vol (A4C):   22.8 ml 12.02 ml/m  LA Biplane Vol: 30.3 ml 15.98 ml/m   AORTIC VALVE  AV Area (Vmax):    2.51 cm  AV Area (Vmean):   2.37 cm  AV Area (VTI):     2.67 cm  AV Vmax:           182.89 cm/s  AV Vmean:          115.410 cm/s  AV VTI:            0.268 m  AV Peak Grad:      13.4 mmHg  AV Mean Grad:      6.4 mmHg  LVOT Vmax:         146.00 cm/s  LVOT Vmean:        87.200 cm/s  LVOT VTI:          0.228 m  LVOT/AV VTI ratio: 0.85    AORTA  Ao Root diam: 3.20 cm  Ao Asc diam:  3.20 cm   MITRAL VALVE  MV Area (PHT): 4.96 cm    SHUNTS  MV Decel Time: 153 msec    Systemic VTI:  0.23 m  MV E velocity: 43.70 cm/s  Systemic Diam: 2.00 cm  MV A velocity: 59.60 cm/s  MV E/A ratio:  0.73   NM MYOVIEW IMPRESSION: 1. No reversible ischemia. Fixed defect in the inferolateral wall suggest prior infarction.   2. Normal left ventricular wall motion.   3. Left ventricular ejection fraction 67%   4. Non invasive risk stratification*: Low  Antimicrobials:  Anti-infectives (From admission, onward)    None       Subjective: Seen and examined at  bedside and still felt some palpitations while she ambulated with some dyspnea on exertion.  Denied chest pain.  Nausea vomiting is improved.  Feels constipated.  Some lightheadedness after ambulation.  No other concerns or complaints this time but subsequently she complained to the physical therapist about her jaw swelling and pain which is worsened.  Legs are also swollen quite a bit so we will do further workup and investigation of both these.  Objective: Vitals:   09/30/22 0800 09/30/22 1228 09/30/22 1722 09/30/22 2047  BP: 117/81 126/82  (!) 128/59  Pulse: 100 (!) 103 (!) 105 (!) 103  Resp: 18 19  19   Temp: 98.1 F (36.7 C) 98.2 F (36.8 C)  98.5 F (36.9 C)  TempSrc: Oral Oral  Oral  SpO2: 100% 100%  100%  Weight:      Height:        Intake/Output Summary (Last 24 hours) at 09/30/2022 2047 Last data filed at 09/30/2022 1900 Gross per 24 hour  Intake 671.91 ml  Output 900 ml  Net -228.09 ml   Filed Weights   09/28/22 1814 09/28/22 1930  Weight: 78 kg 78 kg   Examination: Physical Exam:  Constitutional: WN/WD overweight chronically ill-appearing African-American female in no acute distress Respiratory: Diminished to auscultation bilaterally, no wheezing, rales, rhonchi or crackles. Normal respiratory effort and patient is not tachypenic. No accessory muscle use.  Unlabored breathing Cardiovascular: RRR, no murmurs / rubs / gallops. S1 and S2 auscultated.  Has 1-2+ lower extremity swelling Abdomen: Soft, non-tender, distended secondary to body habitus. Bowel sounds positive.  GU: Deferred. Musculoskeletal: No clubbing / cyanosis of digits/nails. No joint deformity upper and lower extremities.  Skin: No rashes, lesions, ulcers on limited skin evaluation. No induration; Warm and dry.  Neurologic: CN 2-12 grossly intact with no focal deficits. Romberg sign and cerebellar reflexes not assessed.  Psychiatric: Normal judgment and insight. Alert and oriented x 3. Normal mood and  appropriate affect.   Data Reviewed: I have personally reviewed following labs and imaging studies  CBC: Recent Labs  Lab 09/28/22 2032 09/29/22 1007 09/30/22 0826 09/30/22 0829  WBC 5.9 5.3 DUP RN DANA 6.9  NEUTROABS  --   --  PENDING 4.9  HGB 9.5* 8.0* DUP RN DANA 8.3*  HCT 28.0* 24.0* DUP RN DANA 25.1*  MCV 104.9* 105.3* DUP RN DANA 108.2*  PLT 161 160 DUP RN DANA 170   Basic Metabolic Panel: Recent Labs  Lab 09/28/22 2032 09/28/22 2243 09/29/22 0613 09/30/22 1027  NA 135  --  135 133*  K 3.2*  --  3.6 3.5  CL 100  --  98 99  CO2 25  --  27 26  GLUCOSE 181*  --  158* 175*  BUN 12  --  11 10  CREATININE 0.89  --  0.85 0.69  CALCIUM 8.8*  --  8.5* 8.0*  MG  --  1.6* 1.9 1.7  PHOS  --   --   --  2.6   GFR: Estimated Creatinine Clearance: 82.5 mL/min (by C-G formula based on SCr of 0.69  mg/dL). Liver Function Tests: Recent Labs  Lab 09/30/22 1027  AST 23  ALT 17  ALKPHOS 57  BILITOT 1.0  PROT 6.1*  ALBUMIN 2.6*   No results for input(s): "LIPASE", "AMYLASE" in the last 168 hours. No results for input(s): "AMMONIA" in the last 168 hours. Coagulation Profile: No results for input(s): "INR", "PROTIME" in the last 168 hours. Cardiac Enzymes: No results for input(s): "CKTOTAL", "CKMB", "CKMBINDEX", "TROPONINI" in the last 168 hours. BNP (last 3 results) No results for input(s): "PROBNP" in the last 8760 hours. HbA1C: Recent Labs    09/29/22 0613  HGBA1C 5.4   CBG: Recent Labs  Lab 09/30/22 0338 09/30/22 0801 09/30/22 1225 09/30/22 1816 09/30/22 2044  GLUCAP 95 97 130* 104* 156*   Lipid Profile: No results for input(s): "CHOL", "HDL", "LDLCALC", "TRIG", "CHOLHDL", "LDLDIRECT" in the last 72 hours. Thyroid Function Tests: No results for input(s): "TSH", "T4TOTAL", "FREET4", "T3FREE", "THYROIDAB" in the last 72 hours. Anemia Panel: No results for input(s): "VITAMINB12", "FOLATE", "FERRITIN", "TIBC", "IRON", "RETICCTPCT" in the last 72  hours. Sepsis Labs: No results for input(s): "PROCALCITON", "LATICACIDVEN" in the last 168 hours.  No results found for this or any previous visit (from the past 240 hour(s)).   Radiology Studies: NM Myocar Multi W/Spect W/Wall Motion / EF  Result Date: 09/29/2022 CLINICAL DATA:  Chest pain.  57 year old female EXAM: MYOCARDIAL IMAGING WITH SPECT (REST AND PHARMACOLOGIC-STRESS) GATED LEFT VENTRICULAR WALL MOTION STUDY LEFT VENTRICULAR EJECTION FRACTION TECHNIQUE: Standard myocardial SPECT imaging was performed after resting intravenous injection of 10 mCi Tc-35m sestamibi. Subsequently, intravenous infusion of Lexiscan was performed under the supervision of the Cardiology staff. At peak effect of the drug, 30 mCi Tc-92m sestamibi was injected intravenously and standard myocardial SPECT imaging was performed. Quantitative gated imaging was also performed to evaluate left ventricular wall motion, and estimate left ventricular ejection fraction. COMPARISON:  None Available. FINDINGS: Perfusion: Moderate size region of decreased perfusion to the apical and mid segment of the inferolateral wall which is fixed on rest and stress imaging. Wall Motion: Normal left ventricular wall motion. No left ventricular dilation. Left Ventricular Ejection Fraction: 67 % End diastolic volume 94 ml End systolic volume 31 ml IMPRESSION: 1. No reversible ischemia. Fixed defect in the inferolateral wall suggest prior infarction. 2. Normal left ventricular wall motion. 3. Left ventricular ejection fraction 67% 4. Non invasive risk stratification*: Low *2012 Appropriate Use Criteria for Coronary Revascularization Focused Update: J Am Coll Cardiol. 2012;59(9):857-881. http://content.dementiazones.com.aspx?articleid=[email or web address] Electronically Signed   By: Genevive Bi M.D.   On: 09/29/2022 16:37   ECHOCARDIOGRAM COMPLETE  Result Date: 09/29/2022    ECHOCARDIOGRAM REPORT   Patient Name:   Cassidy [name] Date of Exam: 09/29/2022  Medical Rec #:  [id number]       Height:       67.0 in Accession #:    [id number]      Weight:       172.0 lb Date of Birth:  Aug 05, 1965      BSA:          1.897 m Patient Age:    56 years        BP:           140/81 mmHg Patient Gender: F               HR:           98 bpm. Exam Location:  Inpatient Procedure: Cardiac Doppler, Color Doppler and 2D Echo Indications:  Chest Pain R07.9  History:        Patient has prior history of Echocardiogram examinations, most                 recent 06/30/2021. COPD; Risk Factors:Hypertension, Diabetes and                 Dyslipidemia.  Sonographer:    Harriette Bouillon RDCS Referring Phys: [id number] VASUNDHRA RATHORE IMPRESSIONS  1. Left ventricular ejection fraction, by estimation, is 60 to 65%. The left ventricle has normal function. The left ventricle has no regional wall motion abnormalities. There is mild concentric left ventricular hypertrophy. Left ventricular diastolic parameters are consistent with Grade I diastolic dysfunction (impaired relaxation).  2. Right ventricular systolic function is moderately reduced. The right ventricular size is not well visualized.  3. There is prominent systolic anterior motion of the mitral valve, but there is no LV outflow obstruction (Valsalva maneuver was not performed). The mitral valve is normal in structure. Mild mitral valve regurgitation. No evidence of mitral stenosis.  4. The aortic valve is normal in structure. Aortic valve regurgitation is not visualized. No aortic stenosis is present.  5. The inferior vena cava is normal in size with greater than 50% respiratory variability, suggesting right atrial pressure of 3 mmHg. FINDINGS  Left Ventricle: Left ventricular ejection fraction, by estimation, is 60 to 65%. The left ventricle has normal function. The left ventricle has no regional wall motion abnormalities. The left ventricular internal cavity size was normal in size. There is  mild concentric left ventricular hypertrophy.  Abnormal (paradoxical) septal motion, consistent with left bundle branch block. Left ventricular diastolic parameters are consistent with Grade I diastolic dysfunction (impaired relaxation). Normal left ventricular filling pressure. Right Ventricle: The right ventricular size is not well visualized. No increase in right ventricular wall thickness. Right ventricular systolic function is moderately reduced. Left Atrium: Left atrial size was normal in size. Right Atrium: Right atrial size was normal in size. Pericardium: There is no evidence of pericardial effusion. Mitral Valve: There is prominent systolic anterior motion of the mitral valve, but there is no LV outflow obstruction (Valsalva maneuver was not performed). The mitral valve is normal in structure. Mild mitral valve regurgitation. No evidence of mitral valve stenosis. Tricuspid Valve: The tricuspid valve is normal in structure. Tricuspid valve regurgitation is not demonstrated. No evidence of tricuspid stenosis. Aortic Valve: The aortic valve is normal in structure. Aortic valve regurgitation is not visualized. No aortic stenosis is present. Aortic valve mean gradient measures 6.4 mmHg. Aortic valve peak gradient measures 13.4 mmHg. Aortic valve area, by VTI measures 2.67 cm. Pulmonic Valve: The pulmonic valve was normal in structure. Pulmonic valve regurgitation is not visualized. No evidence of pulmonic stenosis. Aorta: The aortic root is normal in size and structure. Venous: The inferior vena cava is normal in size with greater than 50% respiratory variability, suggesting right atrial pressure of 3 mmHg. IAS/Shunts: No atrial level shunt detected by color flow Doppler.  LEFT VENTRICLE PLAX 2D LVIDd:         4.30 cm   Diastology LVIDs:         2.90 cm   LV e' medial:    5.44 cm/s LV PW:         1.30 cm   LV E/e' medial:  8.0 LV IVS:        1.30 cm   LV e' lateral:   5.87 cm/s LVOT diam:     2.00 cm  LV E/e' lateral: 7.4 LV SV:         72 LV SV Index:    38 LVOT Area:     3.14 cm  RIGHT VENTRICLE         IVC TAPSE (M-mode): 0.8 cm  IVC diam: 1.20 cm LEFT ATRIUM             Index        RIGHT ATRIUM           Index LA diam:        2.90 cm 1.53 cm/m   RA Area:     10.90 cm LA Vol (A2C):   36.6 ml 19.30 ml/m  RA Volume:   22.10 ml  11.65 ml/m LA Vol (A4C):   22.8 ml 12.02 ml/m LA Biplane Vol: 30.3 ml 15.98 ml/m  AORTIC VALVE AV Area (Vmax):    2.51 cm AV Area (Vmean):   2.37 cm AV Area (VTI):     2.67 cm AV Vmax:           182.89 cm/s AV Vmean:          115.410 cm/s AV VTI:            0.268 m AV Peak Grad:      13.4 mmHg AV Mean Grad:      6.4 mmHg LVOT Vmax:         146.00 cm/s LVOT Vmean:        87.200 cm/s LVOT VTI:          0.228 m LVOT/AV VTI ratio: 0.85  AORTA Ao Root diam: 3.20 cm Ao Asc diam:  3.20 cm MITRAL VALVE MV Area (PHT): 4.96 cm    SHUNTS MV Decel Time: 153 msec    Systemic VTI:  0.23 m MV E velocity: 43.70 cm/s  Systemic Diam: 2.00 cm MV A velocity: 59.60 cm/s MV E/A ratio:  0.73 Mihai Croitoru MD Electronically signed by Thurmon Fair MD Signature Date/Time: 09/29/2022/3:07:26 PM    Final    CT ABDOMEN PELVIS WO CONTRAST  Result Date: 09/29/2022 CLINICAL DATA:  57 year old female with history of acute onset of nonlocalized abdominal pain with nausea and vomiting for the past 3 days and constipation. History of non-small cell lung cancer. * Tracking Code: BO * EXAM: CT ABDOMEN AND PELVIS WITHOUT CONTRAST TECHNIQUE: Multidetector CT imaging of the abdomen and pelvis was performed following the standard protocol without IV contrast. RADIATION DOSE REDUCTION: This exam was performed according to the departmental dose-optimization program which includes automated exposure control, adjustment of the mA and/or kV according to patient size and/or use of iterative reconstruction technique. COMPARISON:  CT of the abdomen and pelvis 08/02/2022. FINDINGS: Comment: Today's study is limited for detection and characterization of visceral and/or  vascular lesions by lack of IV contrast. Lower chest: Small left and trace right pleural effusions lying dependently. Atherosclerotic calcifications in the descending thoracic aorta as well as the left circumflex and right coronary arteries. Central venous catheter tip in the right atrium incidentally noted. Hepatobiliary: Low-attenuation lesion in the central aspect of segment 7 of the liver (axial image 15 of series 2), similar to prior studies estimated to measure approximately 2.6 x 1.6 cm on today's exam, incompletely characterized without IV contrast. No other definite new suspicious cystic or solid hepatic lesions are confidently identified on today's noncontrast CT examination. Amorphous intermediate attenuation material lying dependently within the gallbladder likely reflects biliary sludge and/or noncalcified gallstones. Gallbladder does not appear distended.  Pancreas: No definite pancreatic mass or peripancreatic fluid collections or inflammatory changes are appreciated on today's noncontrast CT examination. Spleen: Unremarkable. Adrenals/Urinary Tract: High attenuation material within the collecting systems of both kidneys, ureters, and the urinary bladder, related to recent contrast-enhanced chest CTA. Unenhanced appearance of the kidneys is otherwise unremarkable. 1.9 x 1.7 cm low-attenuation (-3 HU) right adrenal nodule (axial image 23 of series 2), similar to prior studies, compatible with an adenoma. Left adrenal gland is unremarkable in appearance. Urinary bladder is unremarkable in appearance. Stomach/Bowel: Unenhanced appearance of the stomach is unremarkable. No definite pathologic dilatation of small bowel or colon. Appendix is not confidently identified may be surgically absent. Vascular/Lymphatic: Atherosclerosis in the abdominal aorta and pelvic vasculature. No appreciable lymphadenopathy confidently identified in the abdomen or pelvis on today's noncontrast CT examination. Reproductive:  Unenhanced appearance of the uterus and ovaries is unremarkable. Other: Small to moderate volume of low-intermediate attenuation (25 HU) ascites. No pneumoperitoneum. Mild diffuse mesenteric edema. Musculoskeletal: Diffuse body wall edema. There are no aggressive appearing lytic or blastic lesions noted in the visualized portions of the skeleton. IMPRESSION: 1. Small to moderate volume of ascites, slightly increased compared to the prior examination. There is also mild mesenteric edema and diffuse body wall edema along with bilateral pleural effusions; imaging findings that suggest a state of anasarca. 2. Biliary sludge lying dependently within the gallbladder, without definite findings to suggest an acute cholecystitis. 3. Low-attenuation lesion in the central aspect of segment 7 of the liver grossly unchanged compared to prior studies, likely benign (potentially a cavernous hemangioma). 4. Aortic atherosclerosis, in addition to least 2 vessel coronary artery disease. Please note that although the presence of coronary artery calcium documents the presence of coronary artery disease, the severity of this disease and any potential stenosis cannot be assessed on this non-gated CT examination. Assessment for potential risk factor modification, dietary therapy or pharmacologic therapy may be warranted, if clinically indicated. 5. Small right adrenal adenoma, stable. 6. Additional incidental findings, as above. Electronically Signed   By: Trudie Reed M.D.   On: 09/29/2022 05:57   CT Angio Chest PE W and/or Wo Contrast  Result Date: 09/28/2022 CLINICAL DATA:  Chest pain and cardiac palpitations for 1 week, history of non-small cell lung carcinoma, initial encounter EXAM: CT ANGIOGRAPHY CHEST WITH CONTRAST TECHNIQUE: Multidetector CT imaging of the chest was performed using the standard protocol during bolus administration of intravenous contrast. Multiplanar CT image reconstructions and MIPs were obtained to  evaluate the vascular anatomy. RADIATION DOSE REDUCTION: This exam was performed according to the departmental dose-optimization program which includes automated exposure control, adjustment of the mA and/or kV according to patient size and/or use of iterative reconstruction technique. CONTRAST:  80mL OMNIPAQUE IOHEXOL 350 MG/ML SOLN COMPARISON:  Chest x-ray from earlier in the same FINDINGS: Cardiovascular: No aneurysmal dilatation of the thoracic aorta is noted. Changes of prior coronary bypass grafting are identified. The pulmonary artery shows a normal branching pattern bilaterally. No filling defect to suggest pulmonary embolism is noted. Diffuse coronary calcifications are noted. Left chest wall port is noted. Mediastinum/Nodes: Thoracic inlet is within normal limits. The esophagus as visualized is within normal limits. No hilar or mediastinal adenopathy is noted. Lungs/Pleura: Small left pleural effusion is noted. A 13 mm somewhat spiculated lesion is noted in the right upper lobe. This has decreased in size from the prior exam at which time it measured up to 15 mm. No new parenchymal nodules are seen. No pneumothorax is noted. Upper  Abdomen: Visualized upper abdomen demonstrates mild ascites and mild changes of anasarca. No other focal abnormality is noted. Musculoskeletal: No acute rib abnormality is noted. No compression deformity is seen. Review of the MIP images confirms the above findings. IMPRESSION: No evidence of pulmonary emboli. 13 mm spiculated lesion in the right upper lobe decreased in size from the prior exam at which time it measured 15 mm. Small left pleural effusion. Electronically Signed   By: Alcide Clever M.D.   On: 09/28/2022 22:36    Scheduled Meds:  aspirin EC  81 mg Oral Daily   atorvastatin  40 mg Oral Daily   busPIRone  7.5 mg Oral BID   [START ON 10/01/2022] carvedilol  12.5 mg Oral BID WC   Chlorhexidine Gluconate Cloth  6 each Topical Daily   docusate sodium  100 mg Oral  Daily   DULoxetine  30 mg Oral Daily   enoxaparin (LOVENOX) injection  40 mg Subcutaneous Q24H   [START ON 10/01/2022] ferrous sulfate  325 mg Oral Q breakfast   folic acid  1 mg Oral Daily   insulin aspart  0-9 Units Subcutaneous Q4H   insulin glargine-yfgn  12 Units Subcutaneous Daily   megestrol  20 mg Oral BID   mometasone-formoterol  2 puff Inhalation BID   oxyCODONE  15 mg Oral Q12H   pantoprazole  40 mg Oral BID   polyethylene glycol  17 g Oral BID   senna-docusate  1 tablet Oral BID   Continuous Infusions:   LOS: 0 days   Marguerita Merles, DO [facility] Available via Epic secure chat 7am-7pm After these hours, please refer to coverage provider listed on [email or web address] 09/30/2022, 8:47 PM

## 2022-09-30 NOTE — Progress Notes (Signed)
Brief Pharmacy Anticoagulation note:  For full details see note from [facility]  [facility]: HL 0.47, therapeutic on 1050 units/hr No bleeding noted Continue heparin drip at 1050 units/hr Confirmatory level in 6 hours  Arley Phenix RPh 09/30/2022, 12:09 AM

## 2022-09-30 NOTE — Plan of Care (Signed)
  Problem: Health Behavior/Discharge Planning: Goal: Ability to manage health-related needs will improve Outcome: Progressing   Problem: Activity: Goal: Risk for activity intolerance will decrease Outcome: Progressing   Problem: Nutrition: Goal: Adequate nutrition will be maintained Outcome: Progressing   Problem: Coping: Goal: Level of anxiety will decrease Outcome: Progressing   Problem: Elimination: Goal: Will not experience complications related to urinary retention Outcome: Progressing   Problem: Safety: Goal: Ability to remain free from injury will improve Outcome: Progressing

## 2022-09-30 NOTE — Progress Notes (Addendum)
Patient Name: Cassidy [name] Date of Encounter: 09/30/2022 [facility] Cardiologist: Reatha Harps, MD   Interval Summary  .    Currently denying any chest pain.  She states that she still gets short of breath with minimal ambulation.  She is also more tachycardic today.  Currently she is a bit annoyed with her chemotherapy and feels like withdrawing that treatment.  Vital Signs .    Vitals:   09/29/22 1940 09/29/22 2343 09/30/22 0335 09/30/22 0800  BP: 123/84 115/64 (!) 118/93 117/81  Pulse: (!) 105 94 (!) 108 100  Resp: 16 16 14 18   Temp: (!) 97.4 F (36.3 C) 98 F (36.7 C) 98.1 F (36.7 C) 98.1 F (36.7 C)  TempSrc: Oral Oral Oral Oral  SpO2: 100% 100% 100% 100%  Weight:      Height:        Intake/Output Summary (Last 24 hours) at 09/30/2022 0930 Last data filed at 09/30/2022 0847 Gross per 24 hour  Intake 646.3 ml  Output 750 ml  Net -103.7 ml      09/28/2022    7:30 PM 09/28/2022    6:14 PM 09/15/2022   11:20 AM  Last 3 Weights  Weight (lbs) 172 lb 172 lb 176 lb 1.6 oz  Weight (kg) 78.019 kg 78.019 kg 79.878 kg      Telemetry/ECG    Normal sinus rhythm and at times tachycardic.  Heart rates between 90-120.- Personally Reviewed  CV Studies    Nuclear med perfusion scan 09/29/2022 IMPRESSION: 1. No reversible ischemia. Fixed defect in the inferolateral wall suggest prior infarction.   2. Normal left ventricular wall motion.   3. Left ventricular ejection fraction 67%   4. Non invasive risk stratification*: Low   *2012 Appropriate Use Criteria for Coronary Revascularization Focused Update: J Am Coll Cardiol. 2012;59(9):857-881. http://content.dementiazones.com.aspx?articleid=1201161  Echocardiogram 09/29/2022 1. Left ventricular ejection fraction, by estimation, is 60 to 65%. The  left ventricle has normal function. The left ventricle has no regional  wall motion abnormalities. There is mild concentric left ventricular  hypertrophy.  Left ventricular diastolic  parameters are consistent with Grade I diastolic dysfunction (impaired  relaxation).   2. Right ventricular systolic function is moderately reduced. The right  ventricular size is not well visualized.   3. There is prominent systolic anterior motion of the mitral valve, but  there is no LV outflow obstruction (Valsalva maneuver was not performed).  The mitral valve is normal in structure. Mild mitral valve regurgitation.  No evidence of mitral stenosis.   4. The aortic valve is normal in structure. Aortic valve regurgitation is  not visualized. No aortic stenosis is present.   5. The inferior vena cava is normal in size with greater than 50%  respiratory variability, suggesting right atrial pressure of 3 mmHg.   Physical Exam .   GEN: No acute distress.   Neck: No JVD Cardiac: RRR, + murmurs Respiratory: Clear to auscultation bilaterally. GI: Soft, nontender, non-distended  MS: 2+ edema  Patient Profile    TIFFANYMARIE FREDRICK is a 57 y.o. 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    NSTEMI CAD status post CABG 2023 Patient recently underwent chemotherapy and has recurrent episode of nausea, vomiting, abdominal pain.  This episode is now accompanied with palpitations and focal reproducible  nonexertional chest pain.  EKG without any acute ST-T wave changes.  Troponins that are relatively flat and mildly elevated. [phone].    Underwent nuclear med perfusion scan yesterday that does not show any reversible ischemia and previous anterolateral infarct.  Overall low risk study.  EF 67% with no regional wall motion abnormalities.  She did have new moderately reduced RV function.  Uncertain whether this is due to undiagnosed sleep apnea (denied symptoms) or potentially chemotherapy induced cardiomyopathy.  Will discuss with  MD.  Her main complaints are shortness of breath with minimal ambulation which may be related to her chemotherapy as she often has symptoms from this.  At this point she has decided to withdraw treatment.  Stopping IV heparin Continue aspirin, carvedilol 12.5 mg twice daily (would reduce this to 6.25mg  when she tolerates), atorvastatin 40 mg. Taken when able to take PO.   Had previous normal echo in 2023  Mild MR Murmur appreciated on exam.  Continue to monitor.   Stage IV lung cancer on chemo Abdominal pain with nausea and vomiting Anemia COPD For questions or updates, please contact [facility] Please consult [email or web address] for contact info under        Signed, Abagail Kitchens, PA-C

## 2022-09-30 NOTE — Plan of Care (Signed)
  Problem: Coping: Goal: Ability to adjust to condition or change in health will improve Outcome: Progressing   Problem: Education: Goal: Knowledge of General Education information will improve Description: Including pain rating scale, medication(s)/side effects and non-pharmacologic comfort measures Outcome: Progressing   Problem: Clinical Measurements: Goal: Respiratory complications will improve Outcome: Progressing   Problem: Activity: Goal: Risk for activity intolerance will decrease Outcome: Progressing   Problem: Coping: Goal: Level of anxiety will decrease Outcome: Progressing   Problem: Pain Managment: Goal: General experience of comfort will improve Outcome: Progressing   Problem: Safety: Goal: Ability to remain free from injury will improve Outcome: Progressing

## 2022-10-01 ENCOUNTER — Inpatient Hospital Stay (HOSPITAL_COMMUNITY): Payer: Medicaid Other

## 2022-10-01 ENCOUNTER — Encounter: Payer: Self-pay | Admitting: Internal Medicine

## 2022-10-01 DIAGNOSIS — I2489 Other forms of acute ischemic heart disease: Secondary | ICD-10-CM | POA: Diagnosis not present

## 2022-10-01 DIAGNOSIS — M7989 Other specified soft tissue disorders: Secondary | ICD-10-CM

## 2022-10-01 DIAGNOSIS — E876 Hypokalemia: Secondary | ICD-10-CM | POA: Diagnosis not present

## 2022-10-01 DIAGNOSIS — R0789 Other chest pain: Secondary | ICD-10-CM | POA: Diagnosis not present

## 2022-10-01 DIAGNOSIS — R220 Localized swelling, mass and lump, head: Secondary | ICD-10-CM

## 2022-10-01 DIAGNOSIS — R109 Unspecified abdominal pain: Secondary | ICD-10-CM | POA: Diagnosis not present

## 2022-10-01 LAB — CBC WITH DIFFERENTIAL/PLATELET
Abs Immature Granulocytes: 0.06 10*3/uL (ref 0.00–0.07)
Basophils Absolute: 0 10*3/uL (ref 0.0–0.1)
Basophils Relative: 0 %
Eosinophils Absolute: 0 10*3/uL (ref 0.0–0.5)
Eosinophils Relative: 0 %
HCT: 21.8 % — ABNORMAL LOW (ref 36.0–46.0)
Hemoglobin: 7.3 g/dL — ABNORMAL LOW (ref 12.0–15.0)
Immature Granulocytes: 1 %
Lymphocytes Relative: 13 %
Lymphs Abs: 0.8 10*3/uL (ref 0.7–4.0)
MCH: 36.3 pg — ABNORMAL HIGH (ref 26.0–34.0)
MCHC: 33.5 g/dL (ref 30.0–36.0)
MCV: 108.5 fL — ABNORMAL HIGH (ref 80.0–100.0)
Monocytes Absolute: 0.9 10*3/uL (ref 0.1–1.0)
Monocytes Relative: 16 %
Neutro Abs: 3.9 10*3/uL (ref 1.7–7.7)
Neutrophils Relative %: 70 %
Platelets: 161 10*3/uL (ref 150–400)
RBC: 2.01 MIL/uL — ABNORMAL LOW (ref 3.87–5.11)
RDW: 14.3 % (ref 11.5–15.5)
WBC: 5.7 10*3/uL (ref 4.0–10.5)
nRBC: 0 % (ref 0.0–0.2)

## 2022-10-01 LAB — MAGNESIUM: Magnesium: 1.7 mg/dL (ref 1.7–2.4)

## 2022-10-01 LAB — GLUCOSE, CAPILLARY
Glucose-Capillary: 102 mg/dL — ABNORMAL HIGH (ref 70–99)
Glucose-Capillary: 104 mg/dL — ABNORMAL HIGH (ref 70–99)
Glucose-Capillary: 107 mg/dL — ABNORMAL HIGH (ref 70–99)
Glucose-Capillary: 132 mg/dL — ABNORMAL HIGH (ref 70–99)

## 2022-10-01 LAB — COMPREHENSIVE METABOLIC PANEL
ALT: 15 U/L (ref 0–44)
AST: 20 U/L (ref 15–41)
Albumin: 2.6 g/dL — ABNORMAL LOW (ref 3.5–5.0)
Alkaline Phosphatase: 57 U/L (ref 38–126)
Anion gap: 7 (ref 5–15)
BUN: 11 mg/dL (ref 6–20)
CO2: 27 mmol/L (ref 22–32)
Calcium: 7.9 mg/dL — ABNORMAL LOW (ref 8.9–10.3)
Chloride: 98 mmol/L (ref 98–111)
Creatinine, Ser: 0.91 mg/dL (ref 0.44–1.00)
GFR, Estimated: >60 mL/min (ref >60)
Glucose, Bld: 98 mg/dL (ref 70–99)
Potassium: 3.3 mmol/L — ABNORMAL LOW (ref 3.5–5.1)
Sodium: 132 mmol/L — ABNORMAL LOW (ref 135–145)
Total Bilirubin: 0.6 mg/dL (ref 0.3–1.2)
Total Protein: 5.8 g/dL — ABNORMAL LOW (ref 6.5–8.1)

## 2022-10-01 LAB — PHOSPHORUS: Phosphorus: 2.9 mg/dL (ref 2.5–4.6)

## 2022-10-01 MED ORDER — HEPARIN SOD (PORK) LOCK FLUSH 100 UNIT/ML IV SOLN
500.0000 [IU] | INTRAVENOUS | Status: AC | PRN
  Administered 2022-10-01: 500 [IU]

## 2022-10-01 MED ORDER — IOHEXOL 300 MG/ML  SOLN
80.0000 mL | Freq: Once | INTRAMUSCULAR | Status: AC | PRN
  Administered 2022-10-01: 80 mL via INTRAVENOUS

## 2022-10-01 MED ORDER — SODIUM CHLORIDE (PF) 0.9 % IJ SOLN
INTRAMUSCULAR | Status: AC
  Filled 2022-10-01: qty 50

## 2022-10-01 MED ORDER — METRONIDAZOLE 500 MG PO TABS: 500.0000 mg | ORAL_TABLET | Freq: Two times a day (BID) | ORAL | Status: DC

## 2022-10-01 MED ORDER — DOXYCYCLINE HYCLATE 100 MG PO TABS: 100.0000 mg | ORAL_TABLET | Freq: Two times a day (BID) | ORAL | 0 refills | Status: AC

## 2022-10-01 MED ORDER — POTASSIUM CHLORIDE CRYS ER 20 MEQ PO TBCR
40.0000 meq | EXTENDED_RELEASE_TABLET | Freq: Two times a day (BID) | ORAL | Status: DC
  Administered 2022-10-01: 40 meq via ORAL
  Filled 2022-10-01: qty 2

## 2022-10-01 MED ORDER — CEFDINIR 300 MG PO CAPS: 300.0000 mg | ORAL_CAPSULE | Freq: Two times a day (BID) | ORAL | 0 refills | Status: AC

## 2022-10-01 MED ORDER — DOCUSATE SODIUM 100 MG PO CAPS: 100.0000 mg | ORAL_CAPSULE | Freq: Every day | ORAL | 0 refills | Status: DC

## 2022-10-01 MED ORDER — METRONIDAZOLE 500 MG PO TABS: 500.0000 mg | ORAL_TABLET | Freq: Two times a day (BID) | ORAL | 0 refills | Status: AC

## 2022-10-01 MED ORDER — SENNOSIDES-DOCUSATE SODIUM 8.6-50 MG PO TABS: 1.0000 | ORAL_TABLET | Freq: Two times a day (BID) | ORAL | 0 refills | Status: DC

## 2022-10-01 MED ORDER — CARVEDILOL 12.5 MG PO TABS: 12.5000 mg | ORAL_TABLET | Freq: Two times a day (BID) | ORAL | 1 refills | Status: DC

## 2022-10-01 MED ORDER — CEFDINIR 300 MG PO CAPS
300.0000 mg | ORAL_CAPSULE | Freq: Two times a day (BID) | ORAL | Status: DC
  Filled 2022-10-01: qty 1

## 2022-10-01 MED ORDER — FUROSEMIDE 10 MG/ML IJ SOLN
40.0000 mg | Freq: Once | INTRAMUSCULAR | Status: AC
  Administered 2022-10-01: 40 mg via INTRAVENOUS
  Filled 2022-10-01: qty 4

## 2022-10-01 MED ORDER — MAGNESIUM SULFATE 2 GM/50ML IV SOLN
2.0000 g | Freq: Once | INTRAVENOUS | Status: AC
  Administered 2022-10-01: 2 g via INTRAVENOUS
  Filled 2022-10-01: qty 50

## 2022-10-01 MED ORDER — BISACODYL 10 MG RE SUPP: 10.0000 mg | Freq: Every day | RECTAL | 0 refills | Status: DC | PRN

## 2022-10-01 MED ORDER — DOXYCYCLINE HYCLATE 100 MG PO TABS: 100.0000 mg | ORAL_TABLET | Freq: Two times a day (BID) | ORAL | Status: DC

## 2022-10-01 NOTE — Discharge Summary (Signed)
Physician Discharge Summary   Patient: Cassidy [name] MRN: [id number] DOB: 1965-08-05  Admit date:     09/28/2022  Discharge date: 10/01/22  Discharge Physician: Marguerita Merles, DO   PCP: [name], MD   Recommendations at discharge:  {Tip this will not be part of the note when signed- Example include specific recommendations for outpatient follow-up, pending tests to follow-up on. (Optional):26781}  ***  Discharge Diagnoses: Principal Problem:   Chest pain Active Problems:   GERD   Type 2 diabetes mellitus (HCC)   Abdominal pain   S/P CABG (coronary artery bypass graft)   Adenocarcinoma of right lung, stage 4 (HCC)   Nausea and vomiting   Pleural effusion   Hypokalemia   Hypomagnesemia   Demand ischemia of myocardium  Resolved Problems:   * No resolved hospital problems. [facility] Course: The patient is a 57 year old African-American overweight female with a past medical history significant for bowel to history of CAD status post CABG in 2023, diabetes mellitus type 2, hypertension, hyperlipidemia, stage IV lung adenocarcinoma on chemotherapy, history of sickle cell trait as well as COPD, GERD, bipolar disorder, depression and other comorbidities who presented to the hospital with complaints of chest discomfort and pain as well as shortness of breath, palpitations and nausea vomiting.  On arrival to the ED she was tachycardic in the 110s however she was afebrile and labs showed no leukocytosis.  Basic labs were done and she was noted to be hypokalemic and hypomagnesemic and troponin was elevated as well as a D-dimer of 3.81.  CTA of the chest PE protocol was obtained an showed no evidence for PE but did show the 13 mm spiculated lesion in the right upper lobe which was decreased in size from prior exam from where it was 15 mm.  She also had a small left pleural effusion.  EKG was done and showed sinus tachycardia with no acute ischemic changes.  The ED PA discussed with  on-call cardiology team recommended starting IV heparin and trending troponin.  Cardiology team felt there is no urgent need to transfer her to [facility].  She was admitted to [facility] and was given aspirin, Zofran, Percocet, oral potassium, IV magnesium and started on IV heparin.  The patient had described the chest discomfort and dyspnea on exertion worsening for the last week.  She describes a chest pain as being substernal/left-sided and described as a squeezing sensation but felt that the palpitations were the worst.  She continued with intermittent episodes over the week and felt very dyspneic and short of breath even walking small distances.  She reports chronic bilateral lower extremity edema which is slightly worsened.  Also had some intermittent nausea and vomiting related to her cancer treatment for the past 3 days has been constantly vomiting and has not been able to take anything p.o.  Constipated and not had a bowel movement for 4 days.  Of note she reports generalized abdominal discomfort and a history of intra-abdominal cancer with metastasis which she is taking pain medications at home for.  Currently she had recurrent chest discomfort and palpitations with cardiology has evaluated and recommending pursuit for further workup despite their suspicion for ACS being on the lower side.  They feel that the course of action is to identify potential ischemic etiologies and obtaining a Myoview.  Echocardiogram has already been done and pending read.  Currently she is being admitted treated for the following but not limited to:  Assessment and Plan:  [place] LOWER EXTREMITY VENOUS (DVT)  Result Date: 10/01/2022  Lower Venous DVT Study Patient Name:  Cassidy [name]  Date of Exam:   10/01/2022 Medical Rec #: [id number]        Accession #:    [id number] Date of Birth: 02/12/1965       Patient Gender: F Patient Age:   57 years Exam Location:  [facility] Procedure:      VAS [place] LOWER EXTREMITY VENOUS (DVT) Referring Phys: Marguerita Merles --------------------------------------------------------------------------------  Indications: Pain, and Swelling.  Risk Factors: Cancer. Limitations: Poor ultrasound/tissue interface. Comparison Study: No prior studies. Performing Technologist: Chanda Busing RVT  Examination Guidelines: A complete evaluation includes B-mode imaging, spectral Doppler, color Doppler, and power Doppler as needed of all accessible portions of each vessel. Bilateral testing is considered an integral part of a complete examination. Limited examinations for reoccurring indications may be performed as noted. The reflux portion of the exam is performed with the patient in reverse Trendelenburg.  +---------+---------------+---------+-----------+----------+--------------+ RIGHT    CompressibilityPhasicitySpontaneityPropertiesThrombus Aging +---------+---------------+---------+-----------+----------+--------------+ CFV      Full           Yes      Yes                                 +---------+---------------+---------+-----------+----------+--------------+ SFJ      Full                                                        +---------+---------------+---------+-----------+----------+--------------+ FV Prox  Full                                                        +---------+---------------+---------+-----------+----------+--------------+ FV Mid   Full                                                         +---------+---------------+---------+-----------+----------+--------------+ FV DistalFull                                                        +---------+---------------+---------+-----------+----------+--------------+ PFV      Full                                                        +---------+---------------+---------+-----------+----------+--------------+ POP      Full           Yes      Yes                                 +---------+---------------+---------+-----------+----------+--------------+ PTV  [place] LOWER EXTREMITY VENOUS (DVT)  Result Date: 10/01/2022  Lower Venous DVT Study Patient Name:  Cassidy [name]  Date of Exam:   10/01/2022 Medical Rec #: [id number]        Accession #:    [id number] Date of Birth: 02/12/1965       Patient Gender: F Patient Age:   57 years Exam Location:  [facility] Procedure:      VAS [place] LOWER EXTREMITY VENOUS (DVT) Referring Phys: Marguerita Merles --------------------------------------------------------------------------------  Indications: Pain, and Swelling.  Risk Factors: Cancer. Limitations: Poor ultrasound/tissue interface. Comparison Study: No prior studies. Performing Technologist: Chanda Busing RVT  Examination Guidelines: A complete evaluation includes B-mode imaging, spectral Doppler, color Doppler, and power Doppler as needed of all accessible portions of each vessel. Bilateral testing is considered an integral part of a complete examination. Limited examinations for reoccurring indications may be performed as noted. The reflux portion of the exam is performed with the patient in reverse Trendelenburg.  +---------+---------------+---------+-----------+----------+--------------+ RIGHT    CompressibilityPhasicitySpontaneityPropertiesThrombus Aging +---------+---------------+---------+-----------+----------+--------------+ CFV      Full           Yes      Yes                                 +---------+---------------+---------+-----------+----------+--------------+ SFJ      Full                                                        +---------+---------------+---------+-----------+----------+--------------+ FV Prox  Full                                                        +---------+---------------+---------+-----------+----------+--------------+ FV Mid   Full                                                         +---------+---------------+---------+-----------+----------+--------------+ FV DistalFull                                                        +---------+---------------+---------+-----------+----------+--------------+ PFV      Full                                                        +---------+---------------+---------+-----------+----------+--------------+ POP      Full           Yes      Yes                                 +---------+---------------+---------+-----------+----------+--------------+ PTV  Physician Discharge Summary   Patient: Cassidy [name] MRN: [id number] DOB: 1965-08-05  Admit date:     09/28/2022  Discharge date: 10/01/22  Discharge Physician: Marguerita Merles, DO   PCP: [name], MD   Recommendations at discharge:  {Tip this will not be part of the note when signed- Example include specific recommendations for outpatient follow-up, pending tests to follow-up on. (Optional):26781}  ***  Discharge Diagnoses: Principal Problem:   Chest pain Active Problems:   GERD   Type 2 diabetes mellitus (HCC)   Abdominal pain   S/P CABG (coronary artery bypass graft)   Adenocarcinoma of right lung, stage 4 (HCC)   Nausea and vomiting   Pleural effusion   Hypokalemia   Hypomagnesemia   Demand ischemia of myocardium  Resolved Problems:   * No resolved hospital problems. [facility] Course: The patient is a 57 year old African-American overweight female with a past medical history significant for bowel to history of CAD status post CABG in 2023, diabetes mellitus type 2, hypertension, hyperlipidemia, stage IV lung adenocarcinoma on chemotherapy, history of sickle cell trait as well as COPD, GERD, bipolar disorder, depression and other comorbidities who presented to the hospital with complaints of chest discomfort and pain as well as shortness of breath, palpitations and nausea vomiting.  On arrival to the ED she was tachycardic in the 110s however she was afebrile and labs showed no leukocytosis.  Basic labs were done and she was noted to be hypokalemic and hypomagnesemic and troponin was elevated as well as a D-dimer of 3.81.  CTA of the chest PE protocol was obtained an showed no evidence for PE but did show the 13 mm spiculated lesion in the right upper lobe which was decreased in size from prior exam from where it was 15 mm.  She also had a small left pleural effusion.  EKG was done and showed sinus tachycardia with no acute ischemic changes.  The ED PA discussed with  on-call cardiology team recommended starting IV heparin and trending troponin.  Cardiology team felt there is no urgent need to transfer her to [facility].  She was admitted to [facility] and was given aspirin, Zofran, Percocet, oral potassium, IV magnesium and started on IV heparin.  The patient had described the chest discomfort and dyspnea on exertion worsening for the last week.  She describes a chest pain as being substernal/left-sided and described as a squeezing sensation but felt that the palpitations were the worst.  She continued with intermittent episodes over the week and felt very dyspneic and short of breath even walking small distances.  She reports chronic bilateral lower extremity edema which is slightly worsened.  Also had some intermittent nausea and vomiting related to her cancer treatment for the past 3 days has been constantly vomiting and has not been able to take anything p.o.  Constipated and not had a bowel movement for 4 days.  Of note she reports generalized abdominal discomfort and a history of intra-abdominal cancer with metastasis which she is taking pain medications at home for.  Currently she had recurrent chest discomfort and palpitations with cardiology has evaluated and recommending pursuit for further workup despite their suspicion for ACS being on the lower side.  They feel that the course of action is to identify potential ischemic etiologies and obtaining a Myoview.  Echocardiogram has already been done and pending read.  Currently she is being admitted treated for the following but not limited to:  Assessment and Plan:  Physician Discharge Summary   Patient: Cassidy [name] MRN: [id number] DOB: 1965-08-05  Admit date:     09/28/2022  Discharge date: 10/01/22  Discharge Physician: Marguerita Merles, DO   PCP: [name], MD   Recommendations at discharge:  {Tip this will not be part of the note when signed- Example include specific recommendations for outpatient follow-up, pending tests to follow-up on. (Optional):26781}  ***  Discharge Diagnoses: Principal Problem:   Chest pain Active Problems:   GERD   Type 2 diabetes mellitus (HCC)   Abdominal pain   S/P CABG (coronary artery bypass graft)   Adenocarcinoma of right lung, stage 4 (HCC)   Nausea and vomiting   Pleural effusion   Hypokalemia   Hypomagnesemia   Demand ischemia of myocardium  Resolved Problems:   * No resolved hospital problems. [facility] Course: The patient is a 57 year old African-American overweight female with a past medical history significant for bowel to history of CAD status post CABG in 2023, diabetes mellitus type 2, hypertension, hyperlipidemia, stage IV lung adenocarcinoma on chemotherapy, history of sickle cell trait as well as COPD, GERD, bipolar disorder, depression and other comorbidities who presented to the hospital with complaints of chest discomfort and pain as well as shortness of breath, palpitations and nausea vomiting.  On arrival to the ED she was tachycardic in the 110s however she was afebrile and labs showed no leukocytosis.  Basic labs were done and she was noted to be hypokalemic and hypomagnesemic and troponin was elevated as well as a D-dimer of 3.81.  CTA of the chest PE protocol was obtained an showed no evidence for PE but did show the 13 mm spiculated lesion in the right upper lobe which was decreased in size from prior exam from where it was 15 mm.  She also had a small left pleural effusion.  EKG was done and showed sinus tachycardia with no acute ischemic changes.  The ED PA discussed with  on-call cardiology team recommended starting IV heparin and trending troponin.  Cardiology team felt there is no urgent need to transfer her to [facility].  She was admitted to [facility] and was given aspirin, Zofran, Percocet, oral potassium, IV magnesium and started on IV heparin.  The patient had described the chest discomfort and dyspnea on exertion worsening for the last week.  She describes a chest pain as being substernal/left-sided and described as a squeezing sensation but felt that the palpitations were the worst.  She continued with intermittent episodes over the week and felt very dyspneic and short of breath even walking small distances.  She reports chronic bilateral lower extremity edema which is slightly worsened.  Also had some intermittent nausea and vomiting related to her cancer treatment for the past 3 days has been constantly vomiting and has not been able to take anything p.o.  Constipated and not had a bowel movement for 4 days.  Of note she reports generalized abdominal discomfort and a history of intra-abdominal cancer with metastasis which she is taking pain medications at home for.  Currently she had recurrent chest discomfort and palpitations with cardiology has evaluated and recommending pursuit for further workup despite their suspicion for ACS being on the lower side.  They feel that the course of action is to identify potential ischemic etiologies and obtaining a Myoview.  Echocardiogram has already been done and pending read.  Currently she is being admitted treated for the following but not limited to:  Assessment and Plan:  Physician Discharge Summary   Patient: Cassidy [name] MRN: [id number] DOB: 1965-08-05  Admit date:     09/28/2022  Discharge date: 10/01/22  Discharge Physician: Marguerita Merles, DO   PCP: [name], MD   Recommendations at discharge:  {Tip this will not be part of the note when signed- Example include specific recommendations for outpatient follow-up, pending tests to follow-up on. (Optional):26781}  ***  Discharge Diagnoses: Principal Problem:   Chest pain Active Problems:   GERD   Type 2 diabetes mellitus (HCC)   Abdominal pain   S/P CABG (coronary artery bypass graft)   Adenocarcinoma of right lung, stage 4 (HCC)   Nausea and vomiting   Pleural effusion   Hypokalemia   Hypomagnesemia   Demand ischemia of myocardium  Resolved Problems:   * No resolved hospital problems. [facility] Course: The patient is a 57 year old African-American overweight female with a past medical history significant for bowel to history of CAD status post CABG in 2023, diabetes mellitus type 2, hypertension, hyperlipidemia, stage IV lung adenocarcinoma on chemotherapy, history of sickle cell trait as well as COPD, GERD, bipolar disorder, depression and other comorbidities who presented to the hospital with complaints of chest discomfort and pain as well as shortness of breath, palpitations and nausea vomiting.  On arrival to the ED she was tachycardic in the 110s however she was afebrile and labs showed no leukocytosis.  Basic labs were done and she was noted to be hypokalemic and hypomagnesemic and troponin was elevated as well as a D-dimer of 3.81.  CTA of the chest PE protocol was obtained an showed no evidence for PE but did show the 13 mm spiculated lesion in the right upper lobe which was decreased in size from prior exam from where it was 15 mm.  She also had a small left pleural effusion.  EKG was done and showed sinus tachycardia with no acute ischemic changes.  The ED PA discussed with  on-call cardiology team recommended starting IV heparin and trending troponin.  Cardiology team felt there is no urgent need to transfer her to [facility].  She was admitted to [facility] and was given aspirin, Zofran, Percocet, oral potassium, IV magnesium and started on IV heparin.  The patient had described the chest discomfort and dyspnea on exertion worsening for the last week.  She describes a chest pain as being substernal/left-sided and described as a squeezing sensation but felt that the palpitations were the worst.  She continued with intermittent episodes over the week and felt very dyspneic and short of breath even walking small distances.  She reports chronic bilateral lower extremity edema which is slightly worsened.  Also had some intermittent nausea and vomiting related to her cancer treatment for the past 3 days has been constantly vomiting and has not been able to take anything p.o.  Constipated and not had a bowel movement for 4 days.  Of note she reports generalized abdominal discomfort and a history of intra-abdominal cancer with metastasis which she is taking pain medications at home for.  Currently she had recurrent chest discomfort and palpitations with cardiology has evaluated and recommending pursuit for further workup despite their suspicion for ACS being on the lower side.  They feel that the course of action is to identify potential ischemic etiologies and obtaining a Myoview.  Echocardiogram has already been done and pending read.  Currently she is being admitted treated for the following but not limited to:  Assessment and Plan:  [place] LOWER EXTREMITY VENOUS (DVT)  Result Date: 10/01/2022  Lower Venous DVT Study Patient Name:  Cassidy [name]  Date of Exam:   10/01/2022 Medical Rec #: [id number]        Accession #:    [id number] Date of Birth: 02/12/1965       Patient Gender: F Patient Age:   57 years Exam Location:  [facility] Procedure:      VAS [place] LOWER EXTREMITY VENOUS (DVT) Referring Phys: Marguerita Merles --------------------------------------------------------------------------------  Indications: Pain, and Swelling.  Risk Factors: Cancer. Limitations: Poor ultrasound/tissue interface. Comparison Study: No prior studies. Performing Technologist: Chanda Busing RVT  Examination Guidelines: A complete evaluation includes B-mode imaging, spectral Doppler, color Doppler, and power Doppler as needed of all accessible portions of each vessel. Bilateral testing is considered an integral part of a complete examination. Limited examinations for reoccurring indications may be performed as noted. The reflux portion of the exam is performed with the patient in reverse Trendelenburg.  +---------+---------------+---------+-----------+----------+--------------+ RIGHT    CompressibilityPhasicitySpontaneityPropertiesThrombus Aging +---------+---------------+---------+-----------+----------+--------------+ CFV      Full           Yes      Yes                                 +---------+---------------+---------+-----------+----------+--------------+ SFJ      Full                                                        +---------+---------------+---------+-----------+----------+--------------+ FV Prox  Full                                                        +---------+---------------+---------+-----------+----------+--------------+ FV Mid   Full                                                         +---------+---------------+---------+-----------+----------+--------------+ FV DistalFull                                                        +---------+---------------+---------+-----------+----------+--------------+ PFV      Full                                                        +---------+---------------+---------+-----------+----------+--------------+ POP      Full           Yes      Yes                                 +---------+---------------+---------+-----------+----------+--------------+ PTV  [place] LOWER EXTREMITY VENOUS (DVT)  Result Date: 10/01/2022  Lower Venous DVT Study Patient Name:  Cassidy [name]  Date of Exam:   10/01/2022 Medical Rec #: [id number]        Accession #:    [id number] Date of Birth: 02/12/1965       Patient Gender: F Patient Age:   57 years Exam Location:  [facility] Procedure:      VAS [place] LOWER EXTREMITY VENOUS (DVT) Referring Phys: Marguerita Merles --------------------------------------------------------------------------------  Indications: Pain, and Swelling.  Risk Factors: Cancer. Limitations: Poor ultrasound/tissue interface. Comparison Study: No prior studies. Performing Technologist: Chanda Busing RVT  Examination Guidelines: A complete evaluation includes B-mode imaging, spectral Doppler, color Doppler, and power Doppler as needed of all accessible portions of each vessel. Bilateral testing is considered an integral part of a complete examination. Limited examinations for reoccurring indications may be performed as noted. The reflux portion of the exam is performed with the patient in reverse Trendelenburg.  +---------+---------------+---------+-----------+----------+--------------+ RIGHT    CompressibilityPhasicitySpontaneityPropertiesThrombus Aging +---------+---------------+---------+-----------+----------+--------------+ CFV      Full           Yes      Yes                                 +---------+---------------+---------+-----------+----------+--------------+ SFJ      Full                                                        +---------+---------------+---------+-----------+----------+--------------+ FV Prox  Full                                                        +---------+---------------+---------+-----------+----------+--------------+ FV Mid   Full                                                         +---------+---------------+---------+-----------+----------+--------------+ FV DistalFull                                                        +---------+---------------+---------+-----------+----------+--------------+ PFV      Full                                                        +---------+---------------+---------+-----------+----------+--------------+ POP      Full           Yes      Yes                                 +---------+---------------+---------+-----------+----------+--------------+ PTV  [place] LOWER EXTREMITY VENOUS (DVT)  Result Date: 10/01/2022  Lower Venous DVT Study Patient Name:  Cassidy [name]  Date of Exam:   10/01/2022 Medical Rec #: [id number]        Accession #:    [id number] Date of Birth: 02/12/1965       Patient Gender: F Patient Age:   57 years Exam Location:  [facility] Procedure:      VAS [place] LOWER EXTREMITY VENOUS (DVT) Referring Phys: Marguerita Merles --------------------------------------------------------------------------------  Indications: Pain, and Swelling.  Risk Factors: Cancer. Limitations: Poor ultrasound/tissue interface. Comparison Study: No prior studies. Performing Technologist: Chanda Busing RVT  Examination Guidelines: A complete evaluation includes B-mode imaging, spectral Doppler, color Doppler, and power Doppler as needed of all accessible portions of each vessel. Bilateral testing is considered an integral part of a complete examination. Limited examinations for reoccurring indications may be performed as noted. The reflux portion of the exam is performed with the patient in reverse Trendelenburg.  +---------+---------------+---------+-----------+----------+--------------+ RIGHT    CompressibilityPhasicitySpontaneityPropertiesThrombus Aging +---------+---------------+---------+-----------+----------+--------------+ CFV      Full           Yes      Yes                                 +---------+---------------+---------+-----------+----------+--------------+ SFJ      Full                                                        +---------+---------------+---------+-----------+----------+--------------+ FV Prox  Full                                                        +---------+---------------+---------+-----------+----------+--------------+ FV Mid   Full                                                         +---------+---------------+---------+-----------+----------+--------------+ FV DistalFull                                                        +---------+---------------+---------+-----------+----------+--------------+ PFV      Full                                                        +---------+---------------+---------+-----------+----------+--------------+ POP      Full           Yes      Yes                                 +---------+---------------+---------+-----------+----------+--------------+ PTV  Physician Discharge Summary   Patient: Cassidy [name] MRN: [id number] DOB: 1965-08-05  Admit date:     09/28/2022  Discharge date: 10/01/22  Discharge Physician: Marguerita Merles, DO   PCP: [name], MD   Recommendations at discharge:  {Tip this will not be part of the note when signed- Example include specific recommendations for outpatient follow-up, pending tests to follow-up on. (Optional):26781}  ***  Discharge Diagnoses: Principal Problem:   Chest pain Active Problems:   GERD   Type 2 diabetes mellitus (HCC)   Abdominal pain   S/P CABG (coronary artery bypass graft)   Adenocarcinoma of right lung, stage 4 (HCC)   Nausea and vomiting   Pleural effusion   Hypokalemia   Hypomagnesemia   Demand ischemia of myocardium  Resolved Problems:   * No resolved hospital problems. [facility] Course: The patient is a 57 year old African-American overweight female with a past medical history significant for bowel to history of CAD status post CABG in 2023, diabetes mellitus type 2, hypertension, hyperlipidemia, stage IV lung adenocarcinoma on chemotherapy, history of sickle cell trait as well as COPD, GERD, bipolar disorder, depression and other comorbidities who presented to the hospital with complaints of chest discomfort and pain as well as shortness of breath, palpitations and nausea vomiting.  On arrival to the ED she was tachycardic in the 110s however she was afebrile and labs showed no leukocytosis.  Basic labs were done and she was noted to be hypokalemic and hypomagnesemic and troponin was elevated as well as a D-dimer of 3.81.  CTA of the chest PE protocol was obtained an showed no evidence for PE but did show the 13 mm spiculated lesion in the right upper lobe which was decreased in size from prior exam from where it was 15 mm.  She also had a small left pleural effusion.  EKG was done and showed sinus tachycardia with no acute ischemic changes.  The ED PA discussed with  on-call cardiology team recommended starting IV heparin and trending troponin.  Cardiology team felt there is no urgent need to transfer her to [facility].  She was admitted to [facility] and was given aspirin, Zofran, Percocet, oral potassium, IV magnesium and started on IV heparin.  The patient had described the chest discomfort and dyspnea on exertion worsening for the last week.  She describes a chest pain as being substernal/left-sided and described as a squeezing sensation but felt that the palpitations were the worst.  She continued with intermittent episodes over the week and felt very dyspneic and short of breath even walking small distances.  She reports chronic bilateral lower extremity edema which is slightly worsened.  Also had some intermittent nausea and vomiting related to her cancer treatment for the past 3 days has been constantly vomiting and has not been able to take anything p.o.  Constipated and not had a bowel movement for 4 days.  Of note she reports generalized abdominal discomfort and a history of intra-abdominal cancer with metastasis which she is taking pain medications at home for.  Currently she had recurrent chest discomfort and palpitations with cardiology has evaluated and recommending pursuit for further workup despite their suspicion for ACS being on the lower side.  They feel that the course of action is to identify potential ischemic etiologies and obtaining a Myoview.  Echocardiogram has already been done and pending read.  Currently she is being admitted treated for the following but not limited to:  Assessment and Plan:  Physician Discharge Summary   Patient: Cassidy [name] MRN: [id number] DOB: 1965-08-05  Admit date:     09/28/2022  Discharge date: 10/01/22  Discharge Physician: Marguerita Merles, DO   PCP: [name], MD   Recommendations at discharge:  {Tip this will not be part of the note when signed- Example include specific recommendations for outpatient follow-up, pending tests to follow-up on. (Optional):26781}  ***  Discharge Diagnoses: Principal Problem:   Chest pain Active Problems:   GERD   Type 2 diabetes mellitus (HCC)   Abdominal pain   S/P CABG (coronary artery bypass graft)   Adenocarcinoma of right lung, stage 4 (HCC)   Nausea and vomiting   Pleural effusion   Hypokalemia   Hypomagnesemia   Demand ischemia of myocardium  Resolved Problems:   * No resolved hospital problems. [facility] Course: The patient is a 57 year old African-American overweight female with a past medical history significant for bowel to history of CAD status post CABG in 2023, diabetes mellitus type 2, hypertension, hyperlipidemia, stage IV lung adenocarcinoma on chemotherapy, history of sickle cell trait as well as COPD, GERD, bipolar disorder, depression and other comorbidities who presented to the hospital with complaints of chest discomfort and pain as well as shortness of breath, palpitations and nausea vomiting.  On arrival to the ED she was tachycardic in the 110s however she was afebrile and labs showed no leukocytosis.  Basic labs were done and she was noted to be hypokalemic and hypomagnesemic and troponin was elevated as well as a D-dimer of 3.81.  CTA of the chest PE protocol was obtained an showed no evidence for PE but did show the 13 mm spiculated lesion in the right upper lobe which was decreased in size from prior exam from where it was 15 mm.  She also had a small left pleural effusion.  EKG was done and showed sinus tachycardia with no acute ischemic changes.  The ED PA discussed with  on-call cardiology team recommended starting IV heparin and trending troponin.  Cardiology team felt there is no urgent need to transfer her to [facility].  She was admitted to [facility] and was given aspirin, Zofran, Percocet, oral potassium, IV magnesium and started on IV heparin.  The patient had described the chest discomfort and dyspnea on exertion worsening for the last week.  She describes a chest pain as being substernal/left-sided and described as a squeezing sensation but felt that the palpitations were the worst.  She continued with intermittent episodes over the week and felt very dyspneic and short of breath even walking small distances.  She reports chronic bilateral lower extremity edema which is slightly worsened.  Also had some intermittent nausea and vomiting related to her cancer treatment for the past 3 days has been constantly vomiting and has not been able to take anything p.o.  Constipated and not had a bowel movement for 4 days.  Of note she reports generalized abdominal discomfort and a history of intra-abdominal cancer with metastasis which she is taking pain medications at home for.  Currently she had recurrent chest discomfort and palpitations with cardiology has evaluated and recommending pursuit for further workup despite their suspicion for ACS being on the lower side.  They feel that the course of action is to identify potential ischemic etiologies and obtaining a Myoview.  Echocardiogram has already been done and pending read.  Currently she is being admitted treated for the following but not limited to:  Assessment and Plan:  [place] LOWER EXTREMITY VENOUS (DVT)  Result Date: 10/01/2022  Lower Venous DVT Study Patient Name:  Cassidy [name]  Date of Exam:   10/01/2022 Medical Rec #: [id number]        Accession #:    [id number] Date of Birth: 02/12/1965       Patient Gender: F Patient Age:   57 years Exam Location:  [facility] Procedure:      VAS [place] LOWER EXTREMITY VENOUS (DVT) Referring Phys: Marguerita Merles --------------------------------------------------------------------------------  Indications: Pain, and Swelling.  Risk Factors: Cancer. Limitations: Poor ultrasound/tissue interface. Comparison Study: No prior studies. Performing Technologist: Chanda Busing RVT  Examination Guidelines: A complete evaluation includes B-mode imaging, spectral Doppler, color Doppler, and power Doppler as needed of all accessible portions of each vessel. Bilateral testing is considered an integral part of a complete examination. Limited examinations for reoccurring indications may be performed as noted. The reflux portion of the exam is performed with the patient in reverse Trendelenburg.  +---------+---------------+---------+-----------+----------+--------------+ RIGHT    CompressibilityPhasicitySpontaneityPropertiesThrombus Aging +---------+---------------+---------+-----------+----------+--------------+ CFV      Full           Yes      Yes                                 +---------+---------------+---------+-----------+----------+--------------+ SFJ      Full                                                        +---------+---------------+---------+-----------+----------+--------------+ FV Prox  Full                                                        +---------+---------------+---------+-----------+----------+--------------+ FV Mid   Full                                                         +---------+---------------+---------+-----------+----------+--------------+ FV DistalFull                                                        +---------+---------------+---------+-----------+----------+--------------+ PFV      Full                                                        +---------+---------------+---------+-----------+----------+--------------+ POP      Full           Yes      Yes                                 +---------+---------------+---------+-----------+----------+--------------+ PTV  Physician Discharge Summary   Patient: Cassidy [name] MRN: [id number] DOB: 1965-08-05  Admit date:     09/28/2022  Discharge date: 10/01/22  Discharge Physician: Marguerita Merles, DO   PCP: [name], MD   Recommendations at discharge:  {Tip this will not be part of the note when signed- Example include specific recommendations for outpatient follow-up, pending tests to follow-up on. (Optional):26781}  ***  Discharge Diagnoses: Principal Problem:   Chest pain Active Problems:   GERD   Type 2 diabetes mellitus (HCC)   Abdominal pain   S/P CABG (coronary artery bypass graft)   Adenocarcinoma of right lung, stage 4 (HCC)   Nausea and vomiting   Pleural effusion   Hypokalemia   Hypomagnesemia   Demand ischemia of myocardium  Resolved Problems:   * No resolved hospital problems. [facility] Course: The patient is a 57 year old African-American overweight female with a past medical history significant for bowel to history of CAD status post CABG in 2023, diabetes mellitus type 2, hypertension, hyperlipidemia, stage IV lung adenocarcinoma on chemotherapy, history of sickle cell trait as well as COPD, GERD, bipolar disorder, depression and other comorbidities who presented to the hospital with complaints of chest discomfort and pain as well as shortness of breath, palpitations and nausea vomiting.  On arrival to the ED she was tachycardic in the 110s however she was afebrile and labs showed no leukocytosis.  Basic labs were done and she was noted to be hypokalemic and hypomagnesemic and troponin was elevated as well as a D-dimer of 3.81.  CTA of the chest PE protocol was obtained an showed no evidence for PE but did show the 13 mm spiculated lesion in the right upper lobe which was decreased in size from prior exam from where it was 15 mm.  She also had a small left pleural effusion.  EKG was done and showed sinus tachycardia with no acute ischemic changes.  The ED PA discussed with  on-call cardiology team recommended starting IV heparin and trending troponin.  Cardiology team felt there is no urgent need to transfer her to [facility].  She was admitted to [facility] and was given aspirin, Zofran, Percocet, oral potassium, IV magnesium and started on IV heparin.  The patient had described the chest discomfort and dyspnea on exertion worsening for the last week.  She describes a chest pain as being substernal/left-sided and described as a squeezing sensation but felt that the palpitations were the worst.  She continued with intermittent episodes over the week and felt very dyspneic and short of breath even walking small distances.  She reports chronic bilateral lower extremity edema which is slightly worsened.  Also had some intermittent nausea and vomiting related to her cancer treatment for the past 3 days has been constantly vomiting and has not been able to take anything p.o.  Constipated and not had a bowel movement for 4 days.  Of note she reports generalized abdominal discomfort and a history of intra-abdominal cancer with metastasis which she is taking pain medications at home for.  Currently she had recurrent chest discomfort and palpitations with cardiology has evaluated and recommending pursuit for further workup despite their suspicion for ACS being on the lower side.  They feel that the course of action is to identify potential ischemic etiologies and obtaining a Myoview.  Echocardiogram has already been done and pending read.  Currently she is being admitted treated for the following but not limited to:  Assessment and Plan:

## 2022-10-01 NOTE — Evaluation (Signed)
Occupational Therapy Evaluation Patient Details Name: Cassidy [name] MRN: [id number] DOB: 04-Sep-1965 Today's Date: 10/01/2022   History of Present Illness pt is 57 y.o admit wiht complaints of chest pain and palpapations, with increased nauseousnes and emesis. PMH: stage IV lung cancer , receiving chemo, CABG x3 06/2021, DM2, COPD, CAD, and HTN PTSD, depression   Clinical Impression   Patient evaluated by Occupational Therapy with no further acute OT needs identified. All education has been completed and the patient has no further questions. Pt would benefit from SW/CM meeting to discuss DME and other benefits.   See below for any follow-up Occupational Therapy or equipment needs. OT is signing off. Thank you for this referral.        If plan is discharge home, recommend the following: A little help with bathing/dressing/bathroom;Assistance with cooking/housework    Functional Status Assessment  Patient has had a recent decline in their functional status and demonstrates the ability to make significant improvements in function in a reasonable and predictable amount of time.  Equipment Recommendations  Tub/shower bench;BSC/3in1 (hand held shower wand)    Recommendations for Other Services       Precautions / Restrictions Precautions Precautions: None Restrictions Weight Bearing Restrictions: No      Mobility Bed Mobility Overal bed mobility: Modified Independent                  Transfers Overall transfer level: Modified independent                        Balance Overall balance assessment: Modified Independent                                         ADL either performed or assessed with clinical judgement   ADL Overall ADL's : At baseline (But needs DME for sufficient care of self at home.)                                       General ADL Comments: Pt is able to perform her ADLs at her "new" baseline since fatigue  and weakness from cancer diagnosis. Pt able to demonstrate ambulation in room and to bathroom without AD or assistance. Pt able to perform her basic ADLs but finds it very fatiguing. Pt reports a near fall with tub transfers andis nervous to try that again. Pt educated on energy conservation with 2 handouts and long discussion on RPE use and how to apply EC methods to day to day life to avoid over fatigue and return to hospital. Pt very receptive and verbalized understanding to all concepts. Pt also educated on the multiple calls from family/friends, spaced out by certain hours for wellness checks. To come check on pt or send ambulance if pt misses two calls 10-15 min apart due to a medical alert system being coset prohibitive. Pt agreeable to this plan and stated family and friends are ready to support her however she needs.     Vision Baseline Vision/History: 1 Wears glasses Ability to See in Adequate Light: 0 Adequate Vision Assessment?: No apparent visual deficits     Perception         Praxis         Pertinent Vitals/Pain Pain Assessment Pain Assessment: 0-10 Pain Score: 6  Pain Location: " all over the body" Pain Descriptors / Indicators: Aching, Sharp, Pins and needles Pain Intervention(s): Limited activity within patient's tolerance, Monitored during session     Extremity/Trunk Assessment Upper Extremity Assessment Upper Extremity Assessment: Left hand dominant;Generalized weakness (Intact but fatigies quickly)   Lower Extremity Assessment Lower Extremity Assessment: Generalized weakness   Cervical / Trunk Assessment Cervical / Trunk Assessment: Normal   Communication Communication Communication: No apparent difficulties   Cognition Arousal: Alert Behavior During Therapy: WFL for tasks assessed/performed Overall Cognitive Status: Within Functional Limits for tasks assessed                                       General Comments       Exercises      Shoulder Instructions      Home Living Family/patient expects to be discharged to:: Private residence Living Arrangements: Alone Available Help at Discharge: Family;Friend(s) Type of Home: House Home Access: Stairs to enter [employer] of Steps: 4+10 Entrance Stairs-Rails: Right;Left Home Layout: One level     Bathroom Shower/Tub: Chief Strategy Officer: Standard Bathroom Accessibility: Yes   Home Equipment: Agricultural consultant (2 wheels);Grab bars - tub/shower;Adaptive equipment Adaptive Equipment: Reacher;Long-handled sponge Additional Comments: pt has great friend who will help her as needed and son as well      Prior Functioning/Environment Prior Level of Function : Independent/Modified Independent             Mobility Comments: pt is independent and wants to stay that way as long as possible. Last few days , week has been difficult becaseu she has not been able to even dres herself without SOB and heart paplations.  Pt reports near falls with tub transfers. ADLs Comments: Has been struggling with LB dressing, fatigue with readiness for MD appts.        OT Problem List: Decreased activity tolerance;Decreased knowledge of use of DME or AE      OT Treatment/Interventions:      OT Goals(Current goals can be found in the care plan section) Acute Rehab OT Goals Patient Stated Goal: Help with aquiring a Rollator a bedside commode and a tub bench. Help with paliiative and current benefits. OT Goal Formulation: All assessment and education complete, DC therapy Potential to Achieve Goals: Good ADL Goals Additional ADL Goal #1: Patient will identify at least 3 energy conservation strategies to employ at home in order to maximize function and quality of life and decrease caregiver burden while preventing exacerbation of symptoms and rehospitalization.  OT Frequency:      Co-evaluation              AM-PAC OT "6 Clicks" Daily Activity     Outcome  Measure Help from another person eating meals?: None Help from another person taking care of personal grooming?: None Help from another person toileting, which includes using toliet, bedpan, or urinal?: None Help from another person bathing (including washing, rinsing, drying)?: A Little Help from another person to put on and taking off regular upper body clothing?: None Help from another person to put on and taking off regular lower body clothing?: A Little 6 Click Score: 22   End of Session    Activity Tolerance: Patient limited by fatigue Patient left: in chair;with call bell/phone within reach  OT Visit Diagnosis: Pain Pain - part of body:  (all over the body)  Time: 2952-8413 OT Time Calculation (min): 39 min Charges:  OT General Charges $OT Visit: 1 Visit OT Evaluation $OT Eval Low Complexity: 1 Low OT Treatments $Self Care/Home Management : 23-37 mins  Victorino Dike, OT Acute Rehab Services Office: [phone] 10/01/2022  Theodoro Clock 10/01/2022, 9:54 AM

## 2022-10-01 NOTE — Progress Notes (Signed)
Mobility Specialist - Progress Note  Pre-mobility: 93 bpm HR,  During mobility: 105 bpm HR,  Post-mobility: 94 bpm HR,    10/01/22 1524  Mobility  Activity Ambulated with assistance in hallway  Level of Assistance Standby assist, set-up cues, supervision of patient - no hands on  Assistive Device Front wheel walker  Distance Ambulated (ft) 480 ft  Range of Motion/Exercises Active  Activity Response Tolerated well  Mobility Referral Yes  $Mobility charge 1 Mobility  Mobility Specialist Start Time (ACUTE ONLY) 1510  Mobility Specialist Stop Time (ACUTE ONLY) 1524  Mobility Specialist Time Calculation (min) (ACUTE ONLY) 14 min   Pt was found in bed and agreeable to ambulate. No complaints with session. At EOS returned to bed with all needs met. Call bell in reach.   Billey Chang Mobility Specialist

## 2022-10-01 NOTE — Progress Notes (Signed)
Bilateral lower extremity venous duplex has been completed. Preliminary results can be found in CV Proc through chart review.   10/01/22 12:04 PM Olen Cordial RVT

## 2022-10-01 NOTE — Progress Notes (Signed)
AVS reviewed w/ Koya who verbalized an understanding. Pt 's ride in room. Pt dressing for d/c to home. No other questions at this time.

## 2022-10-01 NOTE — TOC Progression Note (Addendum)
Transition of Care [facility]) - Progression Note    Patient Details  Name: Cassidy [name] MRN: [id number] Date of Birth: 11-Dec-1965  Transition of Care [facility]) CM/SW Contact  Howell Rucks, RN Phone Number: 10/01/2022, 1:42 PM  Clinical Narrative: Met with pt at bedside, teams chat received from OT pt requesting assisting with medical supplies and information on applying for Medicare. Pt confirmed she currently receives SSI but would like to apply for Medicare Disability. NCM called TOC CSW to assist with answering pt questions, informed pt she has to be on SSI 2 yrs and meet requirements for Medicare to be qualified, pt verbalized understanding. Pt reports she has been paying out of pocket for her incontinent supplies and would like some financial assistance if available. Pt given [facility] Incontinence order form to take to PCP or Oncologist for completion and submission. Per medical  records, pt is currently being followed by [facility] Palliative Medicine Team and [facility] CSW, Ronda Fairly. Teams Chat sent to Darl Pikes, requesting patent follow up after discharge for assistance with application for Medicare and medical supplies. Darl Pikes confirmed follow up with pt post dc. PT eval completed, recommendation HH PT, pt agreeable, no preference. NCM will assist with HH PT. TOC will continue to follow.      Expected Discharge Plan: Home/Self Care Barriers to Discharge: Continued Medical Work up  Expected Discharge Plan and Services       Living arrangements for the past 2 months: Apartment                                       Social Determinants of Health (SDOH) Interventions SDOH Screenings   Food Insecurity: No Food Insecurity (09/29/2022)  Housing: Low Risk  (09/29/2022)  Transportation Needs: No Transportation Needs (09/29/2022)  Utilities: Not At Risk (09/29/2022)  Depression (PHQ2-9): Low Risk  (07/08/2022)  Financial Resource Strain: High Risk (09/08/2021)   Tobacco Use: Medium Risk (09/28/2022)    Readmission Risk Interventions    09/30/2022   12:37 PM  Readmission Risk Prevention Plan  Transportation Screening Complete  PCP or Specialist Appt within 3-5 Days Complete  HRI or Home Care Consult Complete  Social Work Consult for Recovery Care Planning/Counseling Complete  Palliative Care Screening Not Applicable  Medication Review [facility]) Complete

## 2022-10-04 ENCOUNTER — Telehealth: Payer: Self-pay

## 2022-10-04 NOTE — Patient Instructions (Signed)
Visit Information  Greetings Cassidy [name],  Thank you for taking time to visit with me today. I fully support your personal goal of regaining your physical strength so that you can remain as independent as possible and hopefully your MD will be able to order Physical Therapy (PT) when you see your doctor on 9/25. Please don't hesitate to contact me if I can be of assistance to you before our next scheduled telephone appointment.   Our next appointment is by telephone on Friday, Sept. 27 at 1pm  Following is a copy of your care plan:   Goals Addressed             This Visit's Progress    Patient Stated       1Current Barriers:  Knowledge Deficits related to plan of care for management of multiple co-morbidities including Stage IV Lung Cancer - currently receiving chemo, DM2, COPD, GERD, HTN, HLD, Depression, and CAD status post CABG 2023. Patient specifically needs help with "strength training" and regaining as much independence in caring for self as possible.   RNCM Clinical Goal(s):  Patient will verbalize basic understanding of  her Stage IV Lung Cancer disease process and self health management plan as evidenced by her understanding of goals of care in her Cancer treatment. As of 9/23 patient is receiving chemotherapy via her Left Chest mediport, next scheduled infusions are: 10/06/22, 10/16, and 11/6. take all medications exactly as prescribed and will call provider for medication related questions as evidenced by assertion of medication adherence and assertion of understanding that her providers are available to her for questions regarding any of her medications attend all scheduled medical appointments: Has multiple 10/06/22 appointmentswith Oncology  as evidenced by EMR documentation of completed appointments  continue to work with RN Care Manager to address care management and care coordination needs related to  her Stage IV Lung Cancer and multiple other co-morbidity diagnoses as  evidenced by adherence to CM Team Scheduled appointments not experience hospital admission as evidenced by review of EMR. Hospital Admissions in last 6 months = 1x  through collaboration with RN Care manager, provider, and care team.   Interventions: Weekly collaboration x4 with Transition of Care RNCM to discuss ongoing progress in gaining physical strength and re-conditioning, preferably with HHS PT (TBA on 10/06/22 w/ MD), and to discuss ongoing goals of care in patient's Lung Cancer treatment plan.  Inter-disciplinary care team collaboration (see longitudinal plan of care) Evaluation of current treatment plan related to  self management and patient's adherence to plan as established by provider   Oncology:  (Status: New goal.) Short Term Goal Assessment of understanding of oncology diagnosis:  Assessed patient understanding of cancer diagnosis and recommended treatment plan Reviewed upcoming provider appointments and treatment appointments Assessed available transportation to appointments and treatments. Has consistent/reliable transportation: Yes Assessed support system. Has consistent/reliable family or other support: Yes  Patient Goals/Self-Care Activities: Take all medications as prescribed Attend all scheduled provider appointments Call pharmacy for medication refills 3-7 days in advance of running out of medications Perform all self care activities independently  Call provider office for new concerns or questions   Follow Up Plan:  The patient has been provided with contact information for the care management team and has been advised to call with any health related questions or concerns.          Patient verbalizes understanding of instructions and care plan provided today and agrees to view in MyChart. Active MyChart status and patient understanding  of how to access instructions and care plan via MyChart confirmed with patient.     The patient has been provided with contact  information for the care management team and has been advised to call with any health related questions or concerns.   Please call the care guide team at [phone] if you need to cancel or reschedule your appointment.   Please call 1-800-273-TALK (toll free, 24 hour hotline) if you are experiencing a Mental Health or Behavioral Health Crisis or need someone to talk to.  Alyse Low, RN, BA, [facility], CRRN [facility] Coordinator, Transition of Care Ph # [phone]

## 2022-10-04 NOTE — Transitions of Care (Post Inpatient/ED Visit) (Signed)
10/04/2022  Name: Cassidy [name] MRN: [id number] DOB: 06/14/1965  Today's TOC FU Call Status: Today's TOC FU Call Status:: Successful TOC FU Call Completed TOC FU Call Complete Date: 10/04/22 Patient's Name and Date of Birth confirmed.  Transition Care Management Follow-up Telephone Call Date of Discharge: 10/01/22 Discharge Facility: [facility]) Type of Discharge: Inpatient Admission Primary Inpatient Discharge Diagnosis:: Chest pain How have you been since you were released from the hospital?: Same Any questions or concerns?: Yes Patient Questions/Concerns:: Needs help getting her strength back, was not discharged with HHS/PT but has paperwork for MD to sign for referral for PT at home through [facility] - will see MD this coming Wednesday Patient Questions/Concerns Addressed: Other: (enrolled patient into weekly follow up post-hospitalization telephone appts x 1 month, patient's biggest concern is getting stronger and being able to do more for herself)  Items Reviewed: Did you receive and understand the discharge instructions provided?: Yes Medications obtained,verified, and reconciled?: Yes (Medications Reviewed) Any new allergies since your discharge?: No Dietary orders reviewed?: Yes Type of Diet Ordered:: Low Sodium, Heart Healthy Do you have support at home?: Yes People in Home: alone Name of Support/Comfort Primary Source: States brother, son,and neighbors all help and are supportive  Medications Reviewed Today: Medications Reviewed Today     Reviewed by Marcos Eke, RN (Registered Nurse) on 10/04/22 at 1357  Med List Status: <None>   Medication Order Taking? Sig Documenting Provider Last Dose Status Informant  aspirin EC 81 MG tablet [id number] Yes Take 81 mg by mouth daily. [provider] Taking Active Self  atorvastatin (LIPITOR) 40 MG tablet [id number] Yes TAKE 1 TABLET (40 MG TOTAL) BY MOUTH DAILY. [name], DO Taking Active Self   bisacodyl (DULCOLAX) 10 MG suppository [id number] No Place 1 suppository (10 mg total) rectally daily as needed for severe constipation.  Patient not taking: Reported on 10/04/2022   Merlene Laughter, DO Not Taking Active   busPIRone (BUSPAR) 15 MG tablet [id number] Yes Take 0.5 tablets (7.5 mg total) by mouth 2 (two) times daily. [name], DO Taking Active Self  Carboxymethylcellul-Glycerin (LUBRICATING EYE DROPS OP) [id number] Yes Place 1 drop into both eyes daily as needed (dry eyes). [provider] Taking Active Self  carvedilol (COREG) 12.5 MG tablet [id number] Yes Take 1 tablet (12.5 mg total) by mouth 2 (two) times daily with a meal. Marguerita Merles [place], DO Taking Active   cefdinir (OMNICEF) 300 MG capsule [id number] Yes Take 1 capsule (300 mg total) by mouth every 12 (twelve) hours for 10 days. Marguerita Merles [place], [place] Taking Active   diclofenac Sodium (VOLTAREN) 1 % GEL [id number] Yes APPLY 1 APPLICATION TOPICALLY 3 TIMES DAILY AS NEEDED FOR PAIN O'Neal, Ronnald Ramp, MD Taking Active Self  docusate sodium (COLACE) 100 MG capsule [id number] Yes Take 1 capsule (100 mg total) by mouth daily. Sheikh, Omair [place], DO Taking Active   doxycycline (VIBRA-TABS) 100 MG tablet [id number] Yes Take 1 tablet (100 mg total) by mouth every 12 (twelve) hours for 10 days. Sheikh, Omair [place], DO Taking Active   DULoxetine (CYMBALTA) 30 MG capsule [id number] Yes Take 1 capsule (30 mg total) by mouth daily. Antony Madura, MD Taking Active Self  ferrous sulfate 325 (65 FE) MG tablet [id number] Yes Take 325 mg by mouth daily with breakfast. [provider] Taking Active Self  fluticasone (FLONASE) 50 MCG/ACT nasal spray [id number] Yes PLACE 1 SPRAY INTO BOTH NOSTRILS DAILY AS NEEDED (CONGESTION). [name], DO Taking Active  Self           Med Note Mliss Fritz, Dorethea Strubel A   Mon Oct 04, 2022  1:53 PM) As needed  fluticasone-salmeterol (ADVAIR) 100-50 MCG/ACT AEPB [id number] No Inhale 1  puff into the lungs 2 (two) times daily. [name], DO Unknown Active Self  folic acid (FOLVITE) 1 MG tablet [id number] Yes Take 1 tablet (1 mg total) by mouth daily. Si Gaul, MD Taking Active Self  furosemide (LASIX) 40 MG tablet [id number] Yes Take 1 tablet (40 mg total) by mouth daily as needed. If you gain 3 lbs in 24 hours or 5 lbs in 1 week Barrett, Erin R, PA-C Taking Active Self  glucose blood (ACCU-CHEK AVIVA PLUS) test strip [id number] Yes Use as instructed [name], DO Taking Active Self  hyoscyamine (LEVSIN SL) 0.125 MG SL tablet [id number] Yes Place 1 tablet (0.125 mg total) under the tongue every 6 (six) hours as needed. For abdominal cramping Heilingoetter, Cassandra L, PA-C Taking Active Self  insulin glargine (LANTUS) 100 UNIT/ML Solostar Pen [id number] Yes Inject 24 Units into the skin every morning. [name], DO Taking Active Self  Insulin Pen Needle 32G X 4 MM MISC [id number] Yes 1 Needle by Does not apply route in the morning and at bedtime. [name], DO Taking Active Self  ipratropium (ATROVENT HFA) 17 MCG/ACT inhaler [id number] Yes Inhale 2 puffs into the lungs every 4 (four) hours as needed for wheezing (COPD). [provider] Taking Active Self  lactulose (CHRONULAC) 10 GM/15ML solution [id number] No Take 30 mLs (20 g total) by mouth 2 (two) times daily as needed for mild constipation, moderate constipation or severe constipation. Pickenpack-Cousar, Arty Baumgartner, NP Unknown Active Self  lidocaine-prilocaine (EMLA) cream [id number] Yes APPLY 1 APPLICATION TOPICALLY AS NEEDED. Heilingoetter, Cassandra L, PA-C Taking Active Self           Med Note Mliss Fritz, Oneal Biglow A   Mon Oct 04, 2022  1:49 PM) For port access comfort  LORazepam (ATIVAN) 0.5 MG tablet [id number] Yes Take 1 tablet (0.5 mg total) by mouth every 8 (eight) hours as needed for anxiety (nausea). Pickenpack-Cousar, Arty Baumgartner, NP Taking Active Self  megestrol (MEGACE) 20 MG  tablet [id number] Yes Take 1 tablet (20 mg total) by mouth 2 (two) times daily. Pickenpack-Cousar, Arty Baumgartner, NP Taking Active Self  metroNIDAZOLE (FLAGYL) 500 MG tablet [id number] Yes Take 1 tablet (500 mg total) by mouth every 12 (twelve) hours for 10 days. Marguerita Merles [place], DO Taking Active   nicotine polacrilex (NICORETTE) 2 MG gum [id number] No Take 1 each (2 mg total) by mouth as needed for smoking cessation. [name], DO Unknown Active Self  nitroGLYCERIN (NITROSTAT) 0.4 MG SL tablet [id number] No Place 1 tablet (0.4 mg total) under the tongue every 5 (five) minutes as needed for chest pain. [name], DO Unknown Active Self  ondansetron (ZOFRAN-ODT) 8 MG disintegrating tablet [id number] Yes Take 1 tablet (8 mg total) by mouth every 8 (eight) hours as needed for nausea or vomiting. Heilingoetter, Cassandra L, PA-C Taking Active Self  oxyCODONE 10 MG TABS [id number] Yes Take 1 tablet (10 mg total) by mouth every 4 (four) hours as needed for severe pain. Pickenpack-Cousar, Arty Baumgartner, NP Taking Active Self  oxyCODONE ER [facility] ER) 13.5 MG C12A [id number] Yes Take 13.5 mg by mouth every 12 (twelve) hours. Pickenpack-Cousar, Arty Baumgartner, NP Taking Active Self  pantoprazole (PROTONIX) 40 MG tablet [id number] Yes Take 1 tablet (40 mg total) by mouth 2 (two) times daily.  Zehr, Princella Pellegrini, PA-C Taking Active Self  polyethylene glycol powder (GAVILAX) 17 GM/SCOOP powder [id number] No TAKE ONE CAPFUL (17 GRAMS )BY MOUTH DAILY AS NEEDED FOR MILD OR MODERATE CONSTIPATION.  Patient not taking: Reported on 10/04/2022   [name], DO Not Taking Active Self  potassium chloride SA (KLOR-CON M) 20 MEQ tablet [id number] No Take 1 tablet (20 mEq total) by mouth daily as needed. On days you take Lasix Barrett, Erin R, PA-C Unknown Active Self  prochlorperazine (COMPAZINE) 10 MG tablet [id number] No TAKE 1 TABLET (10 MG TOTAL) BY MOUTH EVERY 6 (SIX) HOURS AS NEEDED FOR NAUSEA OR VOMITING.  Patient not  taking: Reported on 10/04/2022   Heilingoetter, Cassandra L, PA-C Not Taking Active Self  senna-docusate (SENOKOT-S) 8.6-50 MG tablet [id number] Yes Take 1 tablet by mouth 2 (two) times daily. Sheikh, Omair [place], [place] Taking Active   valACYclovir (VALTREX) 1000 MG tablet [id number] Yes Take for 3 days prn each outbreak.  Patient taking differently: Take 1,000 mg by mouth daily. Take for 3 days prn each outbreak.   Brock Bad, MD Taking Active Self            Home Care and Equipment/Supplies: Were Home Health Services Ordered?: No (Needs help getting her strength back, was not discharged with HHS/PT but has paperwork for MD to sign for referral for PT at home through [facility] - will see MD this coming Wednesday) Any new equipment or medical supplies ordered?: No  Functional Questionnaire: Do you need assistance with bathing/showering or dressing?: Yes (patient states she needs help learning effective transfer  techniques from PT so she can safely get in and out of the shower/tub herself to help her regain her independence) Do you need assistance with meal preparation?: No Do you need assistance with eating?: No Do you have difficulty maintaining continence: No Do you need assistance with getting out of bed/getting out of a chair/moving?: Yes (patient states she needs help learning effective transfer techniques from PT so she can safely transfer herself and regain her independence) Do you have difficulty managing or taking your medications?: No  Follow up appointments reviewed: PCP Follow-up appointment confirmed?: Yes Date of PCP follow-up appointment?: 10/06/22 Follow-up Provider: Dr. Velora Heckler. [facility] Follow-up appointment confirmed?: Yes Date of Specialist follow-up appointment?: 10/15/22 Follow-Up Specialty Provider:: Cardiology appt Do you need transportation to your follow-up appointment?: No Do you understand care options if your condition(s) worsen?:  Yes-patient verbalized understanding    Alyse Low, RN, BA, [facility], CRRN [facility] Health Care Management Coordinator, Transition of Care Ph # [phone]

## 2022-10-04 NOTE — Progress Notes (Unsigned)
Palliative Medicine [facility]  [phone] [phone]) [phone]   Name: Cassidy [name] Date: 10/04/2022 MRN: [id number]  DOB: 16-Jun-1965  Patient Care Team: [name], MD as PCP - General (Family Medicine) O'Neal, Ronnald Ramp, MD as PCP - Cardiology (Cardiology) Emelia Loron, MD as Consulting Physician (General Surgery)   INTERVAL HISTORY: Cassidy [name] is a 57 y.o. 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  Palliative ask to see for symptom management and goals of care.   SOCIAL HISTORY:    Cassidy [name] reports that she has quit smoking. Her smoking use included cigarettes. She started smoking about 43 years ago. She has a 21.9 pack-year smoking history. She has never used smokeless tobacco. She reports that she does not currently use drugs. She reports that she does not drink alcohol.  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  s/p PCTA to dRCA and ostial RPAV-PLA vessel + PDA branch (05/2012)  b. [place] s/p DES to mLAD - Resolute DES 3.0 x 22 (3.67mm -->3.3 mm)   Bipolar depression (HCC)    Breast abscess    a. right side.    COPD (chronic obstructive pulmonary disease) (HCC)    Depression    Diabetic peripheral neuropathy (HCC)    Fibromyalgia    Genital warts    GERD (gastroesophageal reflux disease)    History of hiatal hernia    HLD (hyperlipidemia)    Hypertension    Lung cancer (HCC)    Pain in limb    a. LE VENOUS DUPLEX, 02/05/2009 - no evidence of deep vein thrombosis, Baker's cyst    Pneumonia    PTSD (post-traumatic stress disorder)    Sickle cell trait (HCC)    Tobacco abuse    Tooth caries    Type II diabetes mellitus (HCC)     ALLERGIES:  is allergic to amoxil [amoxicillin], chantix [varenicline], suboxone [buprenorphine hcl-naloxone hcl], and emend [fosaprepitant dimeglumine].  MEDICATIONS:  Current Outpatient Medications  Medication Sig Dispense Refill   aspirin EC 81 MG tablet Take 81 mg by mouth daily.     atorvastatin (LIPITOR) 40 MG tablet TAKE 1 TABLET (40 MG TOTAL) BY MOUTH DAILY. 90 tablet 3   bisacodyl (DULCOLAX) 10 MG suppository Place 1 suppository (10 mg total) rectally daily as needed for severe constipation. 12 suppository 0   busPIRone (BUSPAR) 15 MG tablet Take 0.5 tablets (7.5 mg total) by mouth 2 (two) times daily. 60 tablet 3   Carboxymethylcellul-Glycerin (LUBRICATING EYE DROPS OP) Place 1 drop into both eyes daily as needed (dry eyes).     carvedilol (COREG) 12.5 MG tablet Take 1 tablet (12.5 mg total) by mouth 2 (two) times daily with a meal. 180 tablet 1   cefdinir (OMNICEF) 300 MG capsule Take 1 capsule (300 mg total) by mouth every 12 (twelve) hours for 10 days. 20 capsule 0   diclofenac Sodium (VOLTAREN) 1 % GEL APPLY 1 APPLICATION TOPICALLY 3 TIMES DAILY AS NEEDED FOR PAIN 200 g 1   docusate sodium (COLACE) 100 MG capsule Take 1 capsule (100  mg total) by mouth daily. 10 capsule 0   doxycycline (VIBRA-TABS) 100 MG tablet Take 1 tablet (100 mg total) by mouth every 12 (twelve) hours for 10 days. 20 tablet 0   DULoxetine (CYMBALTA) 30 MG capsule Take 1 capsule (30 mg total) by mouth daily. 30 capsule 5   ferrous sulfate 325 (65 FE) MG tablet Take 325 mg by mouth daily with breakfast.     fluticasone (FLONASE) 50 MCG/ACT nasal spray PLACE 1 SPRAY INTO BOTH NOSTRILS DAILY AS NEEDED (CONGESTION). 16 g 1   fluticasone-salmeterol (ADVAIR) 100-50 MCG/ACT AEPB Inhale 1 puff into the lungs 2 (two) times daily. 1 each 3   folic acid (FOLVITE) 1  MG tablet Take 1 tablet (1 mg total) by mouth daily. 30 tablet 3   furosemide (LASIX) 40 MG tablet Take 1 tablet (40 mg total) by mouth daily as needed. If you gain 3 lbs in 24 hours or 5 lbs in 1 week 30 tablet 1   glucose blood (ACCU-CHEK AVIVA PLUS) test strip Use as instructed 50 each 1   hyoscyamine (LEVSIN SL) 0.125 MG SL tablet Place 1 tablet (0.125 mg total) under the tongue every 6 (six) hours as needed. For abdominal cramping 30 tablet 0   insulin glargine (LANTUS) 100 UNIT/ML Solostar Pen Inject 24 Units into the skin every morning. 3 mL 11   Insulin Pen Needle 32G X 4 MM MISC 1 Needle by Does not apply route in the morning and at bedtime. 120 each 2   ipratropium (ATROVENT HFA) 17 MCG/ACT inhaler Inhale 2 puffs into the lungs every 4 (four) hours as needed for wheezing (COPD).     lactulose (CHRONULAC) 10 GM/15ML solution Take 30 mLs (20 g total) by mouth 2 (two) times daily as needed for mild constipation, moderate constipation or severe constipation. 236 mL 0   lidocaine-prilocaine (EMLA) cream APPLY 1 APPLICATION TOPICALLY AS NEEDED. 30 g 2   LORazepam (ATIVAN) 0.5 MG tablet Take 1 tablet (0.5 mg total) by mouth every 8 (eight) hours as needed for anxiety (nausea). 30 tablet 0   megestrol (MEGACE) 20 MG tablet Take 1 tablet (20 mg total) by mouth 2 (two) times daily. 60 tablet 0   metroNIDAZOLE (FLAGYL) 500 MG tablet Take 1 tablet (500 mg total) by mouth every 12 (twelve) hours for 10 days. 20 tablet 0   nicotine polacrilex (NICORETTE) 2 MG gum Take 1 each (2 mg total) by mouth as needed for smoking cessation. 100 tablet 0   nitroGLYCERIN (NITROSTAT) 0.4 MG SL tablet Place 1 tablet (0.4 mg total) under the tongue every 5 (five) minutes as needed for chest pain. 25 tablet 5   ondansetron (ZOFRAN-ODT) 8 MG disintegrating tablet Take 1 tablet (8 mg total) by mouth every 8 (eight) hours as needed for nausea or vomiting. 30 tablet 2   oxyCODONE 10 MG TABS Take 1 tablet (10 mg total) by mouth  every 4 (four) hours as needed for severe pain. 90 tablet 0   oxyCODONE ER (XTAMPZA ER) 13.5 MG C12A Take 13.5 mg by mouth every 12 (twelve) hours. 60 capsule 0   pantoprazole (PROTONIX) 40 MG tablet Take 1 tablet (40 mg total) by mouth 2 (two) times daily. 60 tablet 5   polyethylene glycol powder (GAVILAX) 17 GM/SCOOP powder TAKE ONE CAPFUL (17 GRAMS )BY MOUTH DAILY AS NEEDED FOR MILD OR MODERATE CONSTIPATION. 510 g 1   potassium chloride SA (KLOR-CON M) 20 MEQ tablet Take 1 tablet (20 mEq total) by  mouth daily as needed. On days you take Lasix 30 tablet 1   prochlorperazine (COMPAZINE) 10 MG tablet TAKE 1 TABLET (10 MG TOTAL) BY MOUTH EVERY 6 (SIX) HOURS AS NEEDED FOR NAUSEA OR VOMITING. 30 tablet 0   senna-docusate (SENOKOT-S) 8.6-50 MG tablet Take 1 tablet by mouth 2 (two) times daily. 30 tablet 0   valACYclovir (VALTREX) 1000 MG tablet Take for 3 days prn each outbreak. (Patient taking differently: Take 1,000 mg by mouth daily. Take for 3 days prn each outbreak.) 30 tablet 11   No current facility-administered medications for this visit.    VITAL SIGNS: LMP 06/12/2016 (Approximate)  There were no vitals filed for this visit.  Estimated body mass index is 26.94 kg/m as calculated from the following:   Height as of 09/28/22: 5\' 7"  (1.702 m).   Weight as of 09/28/22: 172 lb (78 kg).   PERFORMANCE STATUS (ECOG) : 1 - Symptomatic but completely ambulatory  Assessment  NAD RRR Normal breathing pattern AAO x3  IMPRESSION: .  I saw Ms. Broecker during her infusion.  No acute distress.  No family present.  Patient shares over the past several weeks she has increased fatigue.  Recent hospitalization due to chest pain, palpitations, and shortness of breath.  Recent fall where she is states her legs "gave out".  She is receiving a blood transfusion today and chemo has been held.  Denies nausea, vomiting, or diarrhea.  Appetite has been fair.  Some days better than others.  She continues to take  things one day at a time.  Neoplasm related pain Ms. Herbin reports that her pain is well controlled on current regimen. She is taking Xtampza 13 mg every 12 hours and Oxycodone 5-10 mg every 4 hours as needed for breakthrough pain. Does not require around the clock. Tolerating Cymbalta.   We will continue to closely monitor and support.   2.  Nausea Controlled with medication.   3. Constipation  Better controlled with regimen.  Last bowel movement on yesterday.  4. Goals of Care 09/21/22- Ms. Sayarath is emotional expressing understanding of cancer state. Shares emotionally her concerns of not "doing her best" right now. She knows that she has options to hold or discontinue treatments however expresses she is not ready to make this decision knowing her cancer would potentially progress. She is remaining hopeful for some improvement in her symptoms and quality of life. Emotional support provided.   We discussed the importance of continued conversation with family and their medical providers regarding overall plan of care and treatment options, ensuring decisions are within the context of the patients values and GOCs.  PLAN: Xtampza 13 mg every 12 hours. Oxycodone IR 10 mg every 4-6 hours as needed for breakthrough pain. Senna-S and Miralax on a daily basis for the management of constipation. Zofran, Compazine, and Ativan as needed for nausea Ongoing emotional support and symptom management. Palliative will plan to see patient back in 3-4 weeks in collaboration with her oncology appointments.   Patient expressed understanding and was in agreement with this plan. She also understands that She can call the clinic at any time with any questions, concerns, or complaints.    Any controlled substances utilized were prescribed in the context of palliative care. PDMP has been reviewed.   Any controlled substances utilized were prescribed in the context of palliative care. PDMP has been reviewed.     Visit consisted of counseling and education dealing with the complex and emotionally intense issues of  symptom management and palliative care in the setting of serious and potentially life-threatening illness.Greater than 50%  of this time was spent counseling and coordinating care related to the above assessment and plan.  Willette Alma, AGPCNP-BC  Palliative Medicine Team/[facility]  *Please note that this is a verbal dictation therefore any spelling or grammatical errors are due to the "[employer]" system interpretation.

## 2022-10-06 ENCOUNTER — Inpatient Hospital Stay (HOSPITAL_BASED_OUTPATIENT_CLINIC_OR_DEPARTMENT_OTHER): Payer: Medicaid Other | Admitting: Nurse Practitioner

## 2022-10-06 ENCOUNTER — Inpatient Hospital Stay: Payer: Medicaid Other

## 2022-10-06 ENCOUNTER — Other Ambulatory Visit: Payer: Self-pay | Admitting: Medical Oncology

## 2022-10-06 ENCOUNTER — Telehealth: Payer: Self-pay | Admitting: Medical Oncology

## 2022-10-06 ENCOUNTER — Encounter: Payer: Self-pay | Admitting: Nurse Practitioner

## 2022-10-06 ENCOUNTER — Inpatient Hospital Stay: Payer: Medicaid Other | Admitting: Dietician

## 2022-10-06 ENCOUNTER — Inpatient Hospital Stay (HOSPITAL_BASED_OUTPATIENT_CLINIC_OR_DEPARTMENT_OTHER): Payer: Medicaid Other | Admitting: Internal Medicine

## 2022-10-06 VITALS — BP 107/79 | HR 72 | Temp 98.0°F | Resp 16 | Ht 67.0 in | Wt 173.9 lb

## 2022-10-06 DIAGNOSIS — C3411 Malignant neoplasm of upper lobe, right bronchus or lung: Secondary | ICD-10-CM | POA: Diagnosis present

## 2022-10-06 DIAGNOSIS — C3491 Malignant neoplasm of unspecified part of right bronchus or lung: Secondary | ICD-10-CM

## 2022-10-06 DIAGNOSIS — Z87891 Personal history of nicotine dependence: Secondary | ICD-10-CM | POA: Diagnosis not present

## 2022-10-06 DIAGNOSIS — Z79631 Long term (current) use of antimetabolite agent: Secondary | ICD-10-CM | POA: Diagnosis not present

## 2022-10-06 DIAGNOSIS — G893 Neoplasm related pain (acute) (chronic): Secondary | ICD-10-CM

## 2022-10-06 DIAGNOSIS — Z5111 Encounter for antineoplastic chemotherapy: Secondary | ICD-10-CM | POA: Diagnosis present

## 2022-10-06 DIAGNOSIS — Z79620 Long term (current) use of immunosuppressive biologic: Secondary | ICD-10-CM | POA: Diagnosis not present

## 2022-10-06 DIAGNOSIS — Z8774 Personal history of (corrected) congenital malformations of heart and circulatory system: Secondary | ICD-10-CM | POA: Diagnosis not present

## 2022-10-06 DIAGNOSIS — R530 Neoplastic (malignant) related fatigue: Secondary | ICD-10-CM

## 2022-10-06 DIAGNOSIS — Z880 Allergy status to penicillin: Secondary | ICD-10-CM | POA: Diagnosis not present

## 2022-10-06 DIAGNOSIS — D649 Anemia, unspecified: Secondary | ICD-10-CM

## 2022-10-06 DIAGNOSIS — Z515 Encounter for palliative care: Secondary | ICD-10-CM

## 2022-10-06 DIAGNOSIS — Z79899 Other long term (current) drug therapy: Secondary | ICD-10-CM | POA: Diagnosis not present

## 2022-10-06 DIAGNOSIS — Z5112 Encounter for antineoplastic immunotherapy: Secondary | ICD-10-CM | POA: Diagnosis present

## 2022-10-06 DIAGNOSIS — Z9089 Acquired absence of other organs: Secondary | ICD-10-CM | POA: Diagnosis not present

## 2022-10-06 DIAGNOSIS — E1142 Type 2 diabetes mellitus with diabetic polyneuropathy: Secondary | ICD-10-CM | POA: Diagnosis not present

## 2022-10-06 DIAGNOSIS — K5900 Constipation, unspecified: Secondary | ICD-10-CM | POA: Diagnosis not present

## 2022-10-06 DIAGNOSIS — C787 Secondary malignant neoplasm of liver and intrahepatic bile duct: Secondary | ICD-10-CM | POA: Diagnosis not present

## 2022-10-06 LAB — CBC WITH DIFFERENTIAL (CANCER CENTER ONLY)
Abs Immature Granulocytes: 0.07 10*3/uL (ref 0.00–0.07)
Basophils Absolute: 0 10*3/uL (ref 0.0–0.1)
Basophils Relative: 0 %
Eosinophils Absolute: 0 10*3/uL (ref 0.0–0.5)
Eosinophils Relative: 1 %
HCT: 22.4 % — ABNORMAL LOW (ref 36.0–46.0)
Hemoglobin: 7.7 g/dL — ABNORMAL LOW (ref 12.0–15.0)
Immature Granulocytes: 1 %
Lymphocytes Relative: 17 %
Lymphs Abs: 1 10*3/uL (ref 0.7–4.0)
MCH: 37.6 pg — ABNORMAL HIGH (ref 26.0–34.0)
MCHC: 34.4 g/dL (ref 30.0–36.0)
MCV: 109.3 fL — ABNORMAL HIGH (ref 80.0–100.0)
Monocytes Absolute: 1 10*3/uL (ref 0.1–1.0)
Monocytes Relative: 17 %
Neutro Abs: 3.8 10*3/uL (ref 1.7–7.7)
Neutrophils Relative %: 64 %
Platelet Count: 316 10*3/uL (ref 150–400)
RBC: 2.05 MIL/uL — ABNORMAL LOW (ref 3.87–5.11)
RDW: 16.7 % — ABNORMAL HIGH (ref 11.5–15.5)
WBC Count: 5.8 10*3/uL (ref 4.0–10.5)
nRBC: 0 % (ref 0.0–0.2)

## 2022-10-06 LAB — CMP (CANCER CENTER ONLY)
ALT: 13 U/L (ref 0–44)
AST: 22 U/L (ref 15–41)
Albumin: 3.1 g/dL — ABNORMAL LOW (ref 3.5–5.0)
Alkaline Phosphatase: 71 U/L (ref 38–126)
Anion gap: 4 — ABNORMAL LOW (ref 5–15)
BUN: 16 mg/dL (ref 6–20)
CO2: 27 mmol/L (ref 22–32)
Calcium: 8.1 mg/dL — ABNORMAL LOW (ref 8.9–10.3)
Chloride: 104 mmol/L (ref 98–111)
Creatinine: 1.25 mg/dL — ABNORMAL HIGH (ref 0.44–1.00)
GFR, Estimated: 50 mL/min — ABNORMAL LOW (ref >60)
Glucose, Bld: 164 mg/dL — ABNORMAL HIGH (ref 70–99)
Potassium: 3.8 mmol/L (ref 3.5–5.1)
Sodium: 135 mmol/L (ref 135–145)
Total Bilirubin: 0.4 mg/dL (ref 0.3–1.2)
Total Protein: 6.2 g/dL — ABNORMAL LOW (ref 6.5–8.1)

## 2022-10-06 MED ORDER — HEPARIN SOD (PORK) LOCK FLUSH 100 UNIT/ML IV SOLN: 500.0000 [IU] | Freq: Every day | INTRAVENOUS | Status: DC | PRN

## 2022-10-06 MED ORDER — ACETAMINOPHEN 325 MG PO TABS
650.0000 mg | ORAL_TABLET | Freq: Once | ORAL | Status: AC
  Administered 2022-10-06: 650 mg via ORAL
  Filled 2022-10-06: qty 2

## 2022-10-06 MED ORDER — SODIUM CHLORIDE 0.9% FLUSH: 10.0000 mL | INTRAVENOUS | Status: DC | PRN

## 2022-10-06 MED ORDER — DIPHENHYDRAMINE HCL 25 MG PO CAPS
25.0000 mg | ORAL_CAPSULE | Freq: Once | ORAL | Status: AC
  Administered 2022-10-06: 25 mg via ORAL
  Filled 2022-10-06: qty 1

## 2022-10-06 MED ORDER — SODIUM CHLORIDE 0.9% IV SOLUTION
250.0000 mL | Freq: Once | INTRAVENOUS | Status: AC
  Administered 2022-10-06: 250 mL via INTRAVENOUS

## 2022-10-06 NOTE — Patient Instructions (Signed)

## 2022-10-06 NOTE — Telephone Encounter (Signed)
Pt in lobby.

## 2022-10-06 NOTE — Progress Notes (Signed)
[facility] [phone]   [phone]  OFFICE PROGRESS NOTE  Cassidy Mola, MD [address] [place] [address]  DIAGNOSIS: stage IV (T2a, N2, M1c) non-small cell lung cancer, adenocarcinoma presented with right upper lobe lung mass in addition to right hilar and mediastinal lymphadenopathy and innumerable bilateral pulmonary nodules as well as liver and bone metastasis diagnosed in August 2023.   Detected Alteration(s) / Biomarker(s) Associated FDA-approved therapies Clinical Trial Availability % cfDNA or Amplification  KRAS G12C  approved by FDA Adagrasib, Sotorasib Yes 5.3%  IDH1 R132L  approved in other indication Ivosidenib, Olutasidenib Yes 1.9%  PD-L1 expression 89%  PRIOR THERAPY: None  CURRENT THERAPY: Systemic chemotherapy with carboplatin for AUC of 5, Alimta 500 Mg/M2 and Keytruda 200 Mg IV every 3 weeks.  First dose September 02, 2021.  Status post 19 cycles.  Starting from cycle #5 she is on maintenance treatment with Alimta and Keytruda every 3 weeks.  INTERVAL HISTORY: Cassidy [name] 57 y.o. female returns to the clinic today for follow-up visit. Discussed the use of AI scribe software for clinical note transcription with the patient, who gave verbal consent to proceed.  History of Present Illness   Cassidy [name], a 48 year old patient with a history of cancer, was recently admitted to the hospital due to heart palpitations, fatigue, and dyspnea. These symptoms began a week prior to the current consultation. The patient reported that she was unable to walk short distances without feeling breathless, as if she had run a marathon. During this period, she also experienced a fainting spell which resulted in a fall.  During her hospital stay, which lasted from Friday to Sunday, it was discovered that her potassium, magnesium, and iron levels were off balance. However, no blood transfusion was administered. Concurrently, she was  dealing with a dental abscess which had caused facial swelling. The patient acknowledged the need to have the affected tooth extracted and was prescribed two strong antibiotics, Doxycycline, Omnicef and Flagyl.  In addition to the antibiotics, she was also prescribed Flagyl due to a bad discharge she was experiencing. She was also taking gastric medicine before meals and a constipation medicine, Dulcolax. Despite taking the constipation medicine throughout her hospital stay, she only had a bowel movement yesterday.  The patient expressed concern about her overall health, noting that while she is dealing with cancer, she feels like she is going downhill in other areas. She reported that both of her feet have turned black and are more swollen than before, indicating poor circulation. She also mentioned a fall that occurred yesterday due to feeling wobbly and blacking out.  Her recent lab results showed a low hemoglobin level of 7.7 and an elevated serum creatinine level of 1.25, which is a new development for her. She is currently on pain medication managed by the palliative care team, including Xtampza ER and oxycodone. She recently started taking Megace for appetite improvement.  The patient's son had questions about the pros and cons of stopping chemotherapy, the chances of cancer spreading, whether her feet swelling is related to chemotherapy, and life expectancy with or without treatment. The patient also expressed concern about her cardiovascular health, particularly her blood pressure, which has been dropping due to her Carvedilol medication.        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  s/p PCTA to dRCA and ostial RPAV-PLA vessel + PDA branch (05/2012)  b. [place] s/p DES to mLAD -  Resolute DES 3.0 x 22 (3.9mm -->3.3 mm)   Bipolar depression (HCC)    Breast abscess    a. right side.    COPD (chronic obstructive pulmonary disease) (HCC)    Depression    Diabetic peripheral neuropathy (HCC)    Fibromyalgia    Genital warts    GERD (gastroesophageal reflux disease)    History of hiatal hernia    HLD (hyperlipidemia)    Hypertension    Lung cancer (HCC)    Pain in limb    a. LE VENOUS DUPLEX, 02/05/2009 - no evidence of deep vein thrombosis, Baker's cyst   Pneumonia    PTSD (post-traumatic stress disorder)    Sickle cell trait (HCC)    Tobacco abuse    Tooth caries    Type II diabetes mellitus (HCC)     ALLERGIES:  is allergic to amoxil [amoxicillin], chantix [varenicline], suboxone [buprenorphine hcl-naloxone hcl], and emend [fosaprepitant dimeglumine].  MEDICATIONS:  Current Outpatient Medications  Medication Sig Dispense Refill   aspirin EC 81 MG tablet Take 81 mg by mouth daily.     atorvastatin (LIPITOR) 40 MG tablet TAKE 1 TABLET (40 MG TOTAL) BY MOUTH DAILY. 90 tablet 3   bisacodyl (DULCOLAX) 10 MG suppository Place 1 suppository (10 mg total) rectally daily as needed for severe constipation. (Patient not taking: Reported on 10/04/2022) 12 suppository 0   busPIRone (BUSPAR) 15 MG tablet Take 0.5 tablets (7.5 mg total) by mouth 2 (two) times daily. 60 tablet 3   Carboxymethylcellul-Glycerin (LUBRICATING EYE DROPS OP) Place 1 drop into both eyes daily as needed (dry eyes).     carvedilol (COREG) 12.5 MG tablet Take 1 tablet (12.5 mg total) by mouth 2 (two) times daily with a meal. 180 tablet 1   cefdinir (OMNICEF) 300 MG capsule Take 1 capsule (300 mg total) by mouth every 12 (twelve) hours for 10 days. 20 capsule 0   diclofenac Sodium (VOLTAREN) 1 % GEL APPLY 1 APPLICATION TOPICALLY 3 TIMES DAILY AS NEEDED FOR PAIN 200 g 1   docusate sodium (COLACE) 100 MG capsule Take 1 capsule (100 mg total) by mouth daily. 10 capsule 0   doxycycline (VIBRA-TABS) 100 MG  tablet Take 1 tablet (100 mg total) by mouth every 12 (twelve) hours for 10 days. 20 tablet 0   DULoxetine (CYMBALTA) 30 MG capsule Take 1 capsule (30 mg total) by mouth daily. 30 capsule 5   ferrous sulfate 325 (65 FE) MG tablet Take 325 mg by mouth daily with breakfast.     fluticasone (FLONASE) 50 MCG/ACT nasal spray PLACE 1 SPRAY INTO BOTH NOSTRILS DAILY AS NEEDED (CONGESTION). 16 g 1   fluticasone-salmeterol (ADVAIR) 100-50 MCG/ACT AEPB Inhale 1 puff into the lungs 2 (two) times daily. 1 each 3   folic acid (FOLVITE) 1 MG tablet Take 1 tablet (1 mg total) by mouth daily. 30 tablet 3   furosemide (LASIX) 40 MG tablet Take 1 tablet (40 mg total) by mouth daily as needed. If you gain 3 lbs in 24 hours or 5 lbs in 1 week 30 tablet 1   glucose blood (ACCU-CHEK AVIVA PLUS) test strip Use as instructed 50 each 1   hyoscyamine (LEVSIN SL) 0.125 MG SL tablet Place 1 tablet (0.125 mg total) under the tongue  every 6 (six) hours as needed. For abdominal cramping 30 tablet 0   insulin glargine (LANTUS) 100 UNIT/ML Solostar Pen Inject 24 Units into the skin every morning. 3 mL 11   Insulin Pen Needle 32G X 4 MM MISC 1 Needle by Does not apply route in the morning and at bedtime. 120 each 2   ipratropium (ATROVENT HFA) 17 MCG/ACT inhaler Inhale 2 puffs into the lungs every 4 (four) hours as needed for wheezing (COPD).     lactulose (CHRONULAC) 10 GM/15ML solution Take 30 mLs (20 g total) by mouth 2 (two) times daily as needed for mild constipation, moderate constipation or severe constipation. 236 mL 0   lidocaine-prilocaine (EMLA) cream APPLY 1 APPLICATION TOPICALLY AS NEEDED. 30 g 2   LORazepam (ATIVAN) 0.5 MG tablet Take 1 tablet (0.5 mg total) by mouth every 8 (eight) hours as needed for anxiety (nausea). 30 tablet 0   megestrol (MEGACE) 20 MG tablet Take 1 tablet (20 mg total) by mouth 2 (two) times daily. 60 tablet 0   metroNIDAZOLE (FLAGYL) 500 MG tablet Take 1 tablet (500 mg total) by mouth every 12  (twelve) hours for 10 days. 20 tablet 0   nicotine polacrilex (NICORETTE) 2 MG gum Take 1 each (2 mg total) by mouth as needed for smoking cessation. 100 tablet 0   nitroGLYCERIN (NITROSTAT) 0.4 MG SL tablet Place 1 tablet (0.4 mg total) under the tongue every 5 (five) minutes as needed for chest pain. 25 tablet 5   ondansetron (ZOFRAN-ODT) 8 MG disintegrating tablet Take 1 tablet (8 mg total) by mouth every 8 (eight) hours as needed for nausea or vomiting. 30 tablet 2   oxyCODONE 10 MG TABS Take 1 tablet (10 mg total) by mouth every 4 (four) hours as needed for severe pain. 90 tablet 0   oxyCODONE ER (XTAMPZA ER) 13.5 MG C12A Take 13.5 mg by mouth every 12 (twelve) hours. 60 capsule 0   pantoprazole (PROTONIX) 40 MG tablet Take 1 tablet (40 mg total) by mouth 2 (two) times daily. 60 tablet 5   polyethylene glycol powder (GAVILAX) 17 GM/SCOOP powder TAKE ONE CAPFUL (17 GRAMS )BY MOUTH DAILY AS NEEDED FOR MILD OR MODERATE CONSTIPATION. (Patient not taking: Reported on 10/04/2022) 510 g 1   potassium chloride SA (KLOR-CON M) 20 MEQ tablet Take 1 tablet (20 mEq total) by mouth daily as needed. On days you take Lasix (Patient not taking: Reported on 10/04/2022) 30 tablet 1   prochlorperazine (COMPAZINE) 10 MG tablet TAKE 1 TABLET (10 MG TOTAL) BY MOUTH EVERY 6 (SIX) HOURS AS NEEDED FOR NAUSEA OR VOMITING. (Patient not taking: Reported on 10/04/2022) 30 tablet 0   senna-docusate (SENOKOT-S) 8.6-50 MG tablet Take 1 tablet by mouth 2 (two) times daily. 30 tablet 0   valACYclovir (VALTREX) 1000 MG tablet Take for 3 days prn each outbreak. (Patient taking differently: Take 1,000 mg by mouth daily. Take for 3 days prn each outbreak.) 30 tablet 11   No current facility-administered medications for this visit.    SURGICAL HISTORY:  Past Surgical History:  Procedure Laterality Date   BLADDER SURGERY  1980   "TVT"   BLADDER SURGERY  2003   "TVT"   BREAST EXCISIONAL BIOPSY     BREAST SURGERY Right    I&D for  multiple abscesses   CARDIAC CATHETERIZATION     CORONARY ARTERY BYPASS GRAFT N/A 06/30/2021   Procedure: CORONARY ARTERY BYPASS GRAFTING (CABG) x3 USING LEFT INTERNAL MAMMARY ARTERY,  RIGHT RADIAL  ARTERY AND LEFT GREATER SAPHENOUS VEIN;  Surgeon: Corliss Skains, MD;  Location: [facility] OR;  Service: Open Heart Surgery;  Laterality: N/A;   cutting balloon     ENDOVEIN HARVEST OF GREATER SAPHENOUS VEIN Left 06/30/2021   Procedure: ENDOVEIN HARVEST OF GREATER SAPHENOUS VEIN;  Surgeon: Corliss Skains, MD;  Location: [facility] OR;  Service: Open Heart Surgery;  Laterality: Left;   EYE SURGERY Left    laser surgery   FINE NEEDLE ASPIRATION  08/21/2021   Procedure: FINE NEEDLE ASPIRATION (FNA) LINEAR;  Surgeon: Josephine Igo, DO;  Location: [facility] ENDOSCOPY;  Service: Pulmonary;;   INCISE AND DRAIN ABCESS  ; 04/26/11; 08/13/11   right breast   INCISION AND DRAINAGE ABSCESS Right 05/09/2012   Procedure: INCISION AND DRAINAGE RIGHT BREAST ABSCESS;  Surgeon: Wilmon Arms. Corliss Skains, MD;  Location: [facility] OR;  Service: General;  Laterality: Right;   INCISION AND DRAINAGE ABSCESS Right 02/08/2014   Procedure: INCISION AND DRAINAGE RIGHT BREAST ABSCESS;  Surgeon: Manus Rudd, MD;  Location: [facility] OR;  Service: General;  Laterality: Right;   INCISION AND DRAINAGE ABSCESS Right 06/14/2014   Procedure: INCISION AND DRAINAGE RIGHT BREAST ABSCESS;  Surgeon: Manus Rudd, MD;  Location: [facility] OR;  Service: General;  Laterality: Right;   IR IMAGING GUIDED PORT INSERTION  12/08/2021   IRRIGATION AND DEBRIDEMENT ABSCESS  12/19/2010   Procedure: IRRIGATION AND DEBRIDEMENT ABSCESS;  Surgeon: Emelia Loron, MD;  Location: [facility] OR;  Service: General;  Laterality: Right;   IRRIGATION AND DEBRIDEMENT ABSCESS  08/13/2011   Procedure: MINOR INCISION AND DRAINAGE OF ABSCESS;  Surgeon: Cherylynn Ridges, MD;  Location: [facility] OR;  Service: General;  Laterality: Right;  Right Breast    IRRIGATION AND DEBRIDEMENT ABSCESS  11/17/2011   Procedure:  IRRIGATION AND DEBRIDEMENT ABSCESS;  Surgeon: Wilmon Arms. Corliss Skains, MD;  Location: [facility] OR;  Service: General;  Laterality: Right;  irrigation and debridement right recurrent breast abscess   LARYNX SURGERY     LEFT HEART CATH AND CORONARY ANGIOGRAPHY N/A 04/09/2021   Procedure: LEFT HEART CATH AND CORONARY ANGIOGRAPHY;  Surgeon: Corky Crafts, MD;  Location[facility] INVASIVE CV LAB;  Service: Cardiovascular;  Laterality: N/A;   LEFT HEART CATHETERIZATION WITH CORONARY ANGIOGRAM N/A 05/29/2012   Procedure: LEFT HEART CATHETERIZATION WITH CORONARY ANGIOGRAM & PTCA;  Surgeon: Lennette Bihari, MD;  Location: [facility] CATH LAB;  Service: Cardiovascular; Bifurcation dRCA-RPAD/PLA & rPDA  PTCA.   LEFT HEART CATHETERIZATION WITH CORONARY ANGIOGRAM N/A 11/08/2013   Procedure: LEFT HEART CATHETERIZATION WITH CORONARY ANGIOGRAM and Coronary Stent Intervention;  Surgeon: Marykay Lex, MD;  Location: [facility] CATH LAB;  Service: Cardiovascular; mLAD 80% (FFR 0.73) - resolute DES 3.0 x 22 mm (postdilated to 3.3 mm->3.5 mm)   NM MYOVIEW LTD  01/26/2009   Normal study, no evidence of ischemia, EF 67%   ployp removed from voice box 03/30/12     RADIAL ARTERY HARVEST Right 06/30/2021   Procedure: RADIAL ARTERY HARVEST;  Surgeon: Corliss Skains, MD;  Location: [facility];  Service: Open Heart Surgery;  Laterality: Right;   REFRACTIVE SURGERY  ~ 2010   right   TEE WITHOUT CARDIOVERSION N/A 06/30/2021   Procedure: TRANSESOPHAGEAL ECHOCARDIOGRAM (TEE);  Surgeon: Corliss Skains, MD;  Location[facility];  Service: Open Heart Surgery;  Laterality: N/A;   TONSILLECTOMY  1988   VIDEO BRONCHOSCOPY WITH ENDOBRONCHIAL ULTRASOUND Bilateral 08/21/2021   Procedure: VIDEO BRONCHOSCOPY WITH ENDOBRONCHIAL ULTRASOUND;  Surgeon: Josephine Igo, DO;  Location: [facility]OS[facility];  Service: Pulmonary;  Laterality: Bilateral;    REVIEW OF SYSTEMS:  Constitutional: positive for anorexia, fatigue, and weight loss Eyes: negative Ears, nose, mouth,  throat, and face: negative Respiratory: positive for dyspnea on exertion Cardiovascular: negative Gastrointestinal: positive for constipation Genitourinary:negative Integument/breast: positive for rash and skin color change Hematologic/lymphatic: negative Musculoskeletal:positive for muscle weakness Neurological: negative Behavioral/Psych: negative Endocrine: negative Allergic/Immunologic: negative   PHYSICAL EXAMINATION: General appearance: alert, cooperative, fatigued, and no distress Head: Normocephalic, without obvious abnormality, atraumatic Neck: no adenopathy, no JVD, supple, symmetrical, trachea midline, and thyroid not enlarged, symmetric, no tenderness/mass/nodules Lymph nodes: Cervical, supraclavicular, and axillary nodes normal. Resp: clear to auscultation bilaterally Back: symmetric, no curvature. ROM normal. No CVA tenderness. Cardio: regular rate and rhythm, S1, S2 normal, no murmur, click, rub or gallop GI: soft, non-tender; bowel sounds normal; no masses,  no organomegaly Extremities: edema trace edema in the ankles Neurologic: Alert and oriented X 3, normal strength and tone. Normal symmetric reflexes. Normal coordination and gait  ECOG PERFORMANCE STATUS: 1 - Symptomatic but completely ambulatory  Blood pressure 107/79, pulse 72, temperature 98 F (36.7 C), temperature source Oral, resp. rate 16, height 5\' 7"  (1.702 m), weight 173 lb 14.4 oz (78.9 kg), last menstrual period 06/12/2016, SpO2 100%.  LABORATORY DATA: Lab Results  Component Value Date   WBC 5.8 10/06/2022   HGB 7.7 (L) 10/06/2022   HCT 22.4 (L) 10/06/2022   MCV 109.3 (H) 10/06/2022   PLT 316 10/06/2022      Chemistry      Component Value Date/Time   NA 132 (L) 10/01/2022 0110   NA 140 04/03/2021 1059   K 3.3 (L) 10/01/2022 0110   CL 98 10/01/2022 0110   CO2 27 10/01/2022 0110   BUN 11 10/01/2022 0110   BUN 13 04/03/2021 1059   CREATININE 0.91 10/01/2022 0110   CREATININE 1.04 (H)  09/15/2022 1031   CREATININE 0.65 11/06/2013 1520      Component Value Date/Time   CALCIUM 7.9 (L) 10/01/2022 0110   ALKPHOS 57 10/01/2022 0110   AST 20 10/01/2022 0110   AST 20 09/15/2022 1031   ALT 15 10/01/2022 0110   ALT 14 09/15/2022 1031   BILITOT 0.6 10/01/2022 0110   BILITOT 0.5 09/15/2022 1031       RADIOGRAPHIC STUDIES: VAS [place] LOWER EXTREMITY VENOUS (DVT)  Result Date: 10/02/2022  Lower Venous DVT Study Patient Name:  Cassidy [name]  Date of Exam:   10/01/2022 Medical Rec #: [id number]        Accession #:    [id number] Date of Birth: 26-May-1965       Patient Gender: F Patient Age:   78 years Exam Location:  [facility] Procedure:      VAS [place] LOWER EXTREMITY VENOUS (DVT) Referring Phys: Marguerita Merles --------------------------------------------------------------------------------  Indications: Pain, and Swelling.  Risk Factors: Cancer. Limitations: Poor ultrasound/tissue interface. Comparison Study: No prior studies. Performing Technologist: Chanda Busing RVT  Examination Guidelines: A complete evaluation includes B-mode imaging, spectral Doppler, color Doppler, and power Doppler as needed of all accessible portions of each vessel. Bilateral testing is considered an integral part of a complete examination. Limited examinations for reoccurring indications may be performed as noted. The reflux portion of the exam is performed with the patient in reverse Trendelenburg.  +---------+---------------+---------+-----------+----------+--------------+ RIGHT    CompressibilityPhasicitySpontaneityPropertiesThrombus Aging +---------+---------------+---------+-----------+----------+--------------+ CFV      Full           Yes      Yes                                 +---------+---------------+---------+-----------+----------+--------------+  SFJ      Full                                                         +---------+---------------+---------+-----------+----------+--------------+ FV Prox  Full                                                        +---------+---------------+---------+-----------+----------+--------------+ FV Mid   Full                                                        +---------+---------------+---------+-----------+----------+--------------+ FV DistalFull                                                        +---------+---------------+---------+-----------+----------+--------------+ PFV      Full                                                        +---------+---------------+---------+-----------+----------+--------------+ POP      Full           Yes      Yes                                 +---------+---------------+---------+-----------+----------+--------------+ PTV      Full                                                        +---------+---------------+---------+-----------+----------+--------------+ PERO     Full                                                        +---------+---------------+---------+-----------+----------+--------------+   +---------+---------------+---------+-----------+----------+--------------+ LEFT     CompressibilityPhasicitySpontaneityPropertiesThrombus Aging +---------+---------------+---------+-----------+----------+--------------+ CFV      Full           Yes      Yes                                 +---------+---------------+---------+-----------+----------+--------------+ SFJ      Full                                                        +---------+---------------+---------+-----------+----------+--------------+  FV Prox  Full                                                        +---------+---------------+---------+-----------+----------+--------------+ FV Mid   Full                                                         +---------+---------------+---------+-----------+----------+--------------+ FV DistalFull                                                        +---------+---------------+---------+-----------+----------+--------------+ PFV      Full                                                        +---------+---------------+---------+-----------+----------+--------------+ POP      Full           Yes      Yes                                 +---------+---------------+---------+-----------+----------+--------------+ PTV      Full                                                        +---------+---------------+---------+-----------+----------+--------------+ PERO     Full                                                        +---------+---------------+---------+-----------+----------+--------------+     Summary: RIGHT: - There is no evidence of deep vein thrombosis in the lower extremity.  - No cystic structure found in the popliteal fossa.  LEFT: - There is no evidence of deep vein thrombosis in the lower extremity.  - No cystic structure found in the popliteal fossa.  *See table(s) above for measurements and observations. Electronically signed by Coral Else MD on 10/02/2022 at 10:05:37 AM.    Final    CT MAXILLOFACIAL W CONTRAST  Result Date: 10/01/2022 CLINICAL DATA:  Sublingual/submandibular abscess Left Lower Jaw Swelling. EXAM: CT MAXILLOFACIAL WITH CONTRAST TECHNIQUE: Multidetector CT imaging of the maxillofacial structures was performed with intravenous contrast. Multiplanar CT image reconstructions were also generated. RADIATION DOSE REDUCTION: This exam was performed according to the departmental dose-optimization program which includes automated exposure control, adjustment of the mA and/or kV according to patient size and/or use of iterative reconstruction technique. CONTRAST:  80mL OMNIPAQUE IOHEXOL 300 MG/ML  SOLN COMPARISON:  None Available. FINDINGS: Osseous: No  fracture or  mandibular dislocation. No destructive process. Orbits: Negative. No traumatic or inflammatory finding. Sinuses: No significant sinus opacification. Soft tissues: Mild subcutaneous fat stranding reactive lymphadenopathy along the left jaw. No abscess. Limited intracranial: No significant or unexpected finding. IMPRESSION: Mild subcutaneous fat stranding and reactive lymphadenopathy along the left jaw, consistent with cellulitis. No abscess. Electronically Signed   By: Orvan Falconer M.D.   On: 10/01/2022 16:33   DG CHEST PORT 1 VIEW  Result Date: 10/01/2022 CLINICAL DATA:  Shortness of breath. EXAM: PORTABLE CHEST 1 VIEW COMPARISON:  09/28/2022 radiographs and CT, lung bases from abdominal CT 09/29/2022 FINDINGS: Left chest port in place. Prior median sternotomy and CABG. Small left pleural effusion which is better assessed on prior CT's. No pulmonary edema, focal airspace disease or pneumothorax. The known right apical pulmonary nodule is not well seen. IMPRESSION: Small left pleural effusion which is better assessed on prior CT. Electronically Signed   By: Narda Rutherford M.D.   On: 10/01/2022 08:59   NM Myocar Multi W/Spect Izetta Dakin Motion / EF  Result Date: 09/29/2022 CLINICAL DATA:  Chest pain.  57 year old female EXAM: MYOCARDIAL IMAGING WITH SPECT (REST AND PHARMACOLOGIC-STRESS) GATED LEFT VENTRICULAR WALL MOTION STUDY LEFT VENTRICULAR EJECTION FRACTION TECHNIQUE: Standard myocardial SPECT imaging was performed after resting intravenous injection of 10 mCi Tc-29m sestamibi. Subsequently, intravenous infusion of Lexiscan was performed under the supervision of the Cardiology staff. At peak effect of the drug, 30 mCi Tc-52m sestamibi was injected intravenously and standard myocardial SPECT imaging was performed. Quantitative gated imaging was also performed to evaluate left ventricular wall motion, and estimate left ventricular ejection fraction. COMPARISON:  None Available. FINDINGS:  Perfusion: Moderate size region of decreased perfusion to the apical and mid segment of the inferolateral wall which is fixed on rest and stress imaging. Wall Motion: Normal left ventricular wall motion. No left ventricular dilation. Left Ventricular Ejection Fraction: 67 % End diastolic volume 94 ml End systolic volume 31 ml IMPRESSION: 1. No reversible ischemia. Fixed defect in the inferolateral wall suggest prior infarction. 2. Normal left ventricular wall motion. 3. Left ventricular ejection fraction 67% 4. Non invasive risk stratification*: Low *2012 Appropriate Use Criteria for Coronary Revascularization Focused Update: J Am Coll Cardiol. 2012;59(9):857-881. http://content.[email or web address]?articleid=[email or web address] Electronically Signed   By: Genevive Bi M.D.   On: 09/29/2022 16:37   ECHOCARDIOGRAM COMPLETE  Result Date: 09/29/2022    ECHOCARDIOGRAM REPORT   Patient Name:   Cassidy [name] Date of Exam: 09/29/2022 Medical Rec #:  [id number]       Height:       67.0 in Accession #:    [id number]      Weight:       172.0 lb Date of Birth:  [birth year]      BSA:          1.897 m Patient Age:    56 years        BP:           140/81 mmHg Patient Gender: F               HR:           98 bpm. Exam Location:  Inpatient Procedure: Cardiac Doppler, Color Doppler and 2D Echo Indications:    Chest Pain R07.9  History:        Patient has prior history of Echocardiogram examinations, most                 recent 06/30/2021. COPD; Risk Factors:Hypertension, Diabetes and  Dyslipidemia.  Sonographer:    Harriette Bouillon RDCS Referring Phys: [id number] VASUNDHRA RATHORE IMPRESSIONS  1. Left ventricular ejection fraction, by estimation, is 60 to 65%. The left ventricle has normal function. The left ventricle has no regional wall motion abnormalities. There is mild concentric left ventricular hypertrophy. Left ventricular diastolic parameters are consistent with Grade I diastolic dysfunction (impaired  relaxation).  2. Right ventricular systolic function is moderately reduced. The right ventricular size is not well visualized.  3. There is prominent systolic anterior motion of the mitral valve, but there is no LV outflow obstruction (Valsalva maneuver was not performed). The mitral valve is normal in structure. Mild mitral valve regurgitation. No evidence of mitral stenosis.  4. The aortic valve is normal in structure. Aortic valve regurgitation is not visualized. No aortic stenosis is present.  5. The inferior vena cava is normal in size with greater than 50% respiratory variability, suggesting right atrial pressure of 3 mmHg. FINDINGS  Left Ventricle: Left ventricular ejection fraction, by estimation, is 60 to 65%. The left ventricle has normal function. The left ventricle has no regional wall motion abnormalities. The left ventricular internal cavity size was normal in size. There is  mild concentric left ventricular hypertrophy. Abnormal (paradoxical) septal motion, consistent with left bundle branch block. Left ventricular diastolic parameters are consistent with Grade I diastolic dysfunction (impaired relaxation). Normal left ventricular filling pressure. Right Ventricle: The right ventricular size is not well visualized. No increase in right ventricular wall thickness. Right ventricular systolic function is moderately reduced. Left Atrium: Left atrial size was normal in size. Right Atrium: Right atrial size was normal in size. Pericardium: There is no evidence of pericardial effusion. Mitral Valve: There is prominent systolic anterior motion of the mitral valve, but there is no LV outflow obstruction (Valsalva maneuver was not performed). The mitral valve is normal in structure. Mild mitral valve regurgitation. No evidence of mitral valve stenosis. Tricuspid Valve: The tricuspid valve is normal in structure. Tricuspid valve regurgitation is not demonstrated. No evidence of tricuspid stenosis. Aortic Valve:  The aortic valve is normal in structure. Aortic valve regurgitation is not visualized. No aortic stenosis is present. Aortic valve mean gradient measures 6.4 mmHg. Aortic valve peak gradient measures 13.4 mmHg. Aortic valve area, by VTI measures 2.67 cm. Pulmonic Valve: The pulmonic valve was normal in structure. Pulmonic valve regurgitation is not visualized. No evidence of pulmonic stenosis. Aorta: The aortic root is normal in size and structure. Venous: The inferior vena cava is normal in size with greater than 50% respiratory variability, suggesting right atrial pressure of 3 mmHg. IAS/Shunts: No atrial level shunt detected by color flow Doppler.  LEFT VENTRICLE PLAX 2D LVIDd:         4.30 cm   Diastology LVIDs:         2.90 cm   LV e' medial:    5.44 cm/s LV PW:         1.30 cm   LV E/e' medial:  8.0 LV IVS:        1.30 cm   LV e' lateral:   5.87 cm/s LVOT diam:     2.00 cm   LV E/e' lateral: 7.4 LV SV:         72 LV SV Index:   38 LVOT Area:     3.14 cm  RIGHT VENTRICLE         IVC TAPSE (M-mode): 0.8 cm  IVC diam: 1.20 cm LEFT ATRIUM  Index        RIGHT ATRIUM           Index LA diam:        2.90 cm 1.53 cm/m   RA Area:     10.90 cm LA Vol (A2C):   36.6 ml 19.30 ml/m  RA Volume:   22.10 ml  11.65 ml/m LA Vol (A4C):   22.8 ml 12.02 ml/m LA Biplane Vol: 30.3 ml 15.98 ml/m  AORTIC VALVE AV Area (Vmax):    2.51 cm AV Area (Vmean):   2.37 cm AV Area (VTI):     2.67 cm AV Vmax:           182.89 cm/s AV Vmean:          115.410 cm/s AV VTI:            0.268 m AV Peak Grad:      13.4 mmHg AV Mean Grad:      6.4 mmHg LVOT Vmax:         146.00 cm/s LVOT Vmean:        87.200 cm/s LVOT VTI:          0.228 m LVOT/AV VTI ratio: 0.85  AORTA Ao Root diam: 3.20 cm Ao Asc diam:  3.20 cm MITRAL VALVE MV Area (PHT): 4.96 cm    SHUNTS MV Decel Time: 153 msec    Systemic VTI:  0.23 m MV E velocity: 43.70 cm/s  Systemic Diam: 2.00 cm MV A velocity: 59.60 cm/s MV E/A ratio:  0.73 Mihai Croitoru MD  Electronically signed by Thurmon Fair MD Signature Date/Time: 09/29/2022/3:07:26 PM    Final    CT ABDOMEN PELVIS WO CONTRAST  Result Date: 09/29/2022 CLINICAL DATA:  57 year old female with history of acute onset of nonlocalized abdominal pain with nausea and vomiting for the past 3 days and constipation. History of non-small cell lung cancer. * Tracking Code: BO * EXAM: CT ABDOMEN AND PELVIS WITHOUT CONTRAST TECHNIQUE: Multidetector CT imaging of the abdomen and pelvis was performed following the standard protocol without IV contrast. RADIATION DOSE REDUCTION: This exam was performed according to the departmental dose-optimization program which includes automated exposure control, adjustment of the mA and/or kV according to patient size and/or use of iterative reconstruction technique. COMPARISON:  CT of the abdomen and pelvis 08/02/2022. FINDINGS: Comment: Today's study is limited for detection and characterization of visceral and/or vascular lesions by lack of IV contrast. Lower chest: Small left and trace right pleural effusions lying dependently. Atherosclerotic calcifications in the descending thoracic aorta as well as the left circumflex and right coronary arteries. Central venous catheter tip in the right atrium incidentally noted. Hepatobiliary: Low-attenuation lesion in the central aspect of segment 7 of the liver (axial image 15 of series 2), similar to prior studies estimated to measure approximately 2.6 x 1.6 cm on today's exam, incompletely characterized without IV contrast. No other definite new suspicious cystic or solid hepatic lesions are confidently identified on today's noncontrast CT examination. Amorphous intermediate attenuation material lying dependently within the gallbladder likely reflects biliary sludge and/or noncalcified gallstones. Gallbladder does not appear distended. Pancreas: No definite pancreatic mass or peripancreatic fluid collections or inflammatory changes are  appreciated on today's noncontrast CT examination. Spleen: Unremarkable. Adrenals/Urinary Tract: High attenuation material within the collecting systems of both kidneys, ureters, and the urinary bladder, related to recent contrast-enhanced chest CTA. Unenhanced appearance of the kidneys is otherwise unremarkable. 1.9 x 1.7 cm low-attenuation (-3 HU) right adrenal nodule (axial image  23 of series 2), similar to prior studies, compatible with an adenoma. Left adrenal gland is unremarkable in appearance. Urinary bladder is unremarkable in appearance. Stomach/Bowel: Unenhanced appearance of the stomach is unremarkable. No definite pathologic dilatation of small bowel or colon. Appendix is not confidently identified may be surgically absent. Vascular/Lymphatic: Atherosclerosis in the abdominal aorta and pelvic vasculature. No appreciable lymphadenopathy confidently identified in the abdomen or pelvis on today's noncontrast CT examination. Reproductive: Unenhanced appearance of the uterus and ovaries is unremarkable. Other: Small to moderate volume of low-intermediate attenuation (25 HU) ascites. No pneumoperitoneum. Mild diffuse mesenteric edema. Musculoskeletal: Diffuse body wall edema. There are no aggressive appearing lytic or blastic lesions noted in the visualized portions of the skeleton. IMPRESSION: 1. Small to moderate volume of ascites, slightly increased compared to the prior examination. There is also mild mesenteric edema and diffuse body wall edema along with bilateral pleural effusions; imaging findings that suggest a state of anasarca. 2. Biliary sludge lying dependently within the gallbladder, without definite findings to suggest an acute cholecystitis. 3. Low-attenuation lesion in the central aspect of segment 7 of the liver grossly unchanged compared to prior studies, likely benign (potentially a cavernous hemangioma). 4. Aortic atherosclerosis, in addition to least 2 vessel coronary artery disease.  Please note that although the presence of coronary artery calcium documents the presence of coronary artery disease, the severity of this disease and any potential stenosis cannot be assessed on this non-gated CT examination. Assessment for potential risk factor modification, dietary therapy or pharmacologic therapy may be warranted, if clinically indicated. 5. Small right adrenal adenoma, stable. 6. Additional incidental findings, as above. Electronically Signed   By: Trudie Reed M.D.   On: 09/29/2022 05:57   CT Angio Chest PE W and/or Wo Contrast  Result Date: 09/28/2022 CLINICAL DATA:  Chest pain and cardiac palpitations for 1 week, history of non-small cell lung carcinoma, initial encounter EXAM: CT ANGIOGRAPHY CHEST WITH CONTRAST TECHNIQUE: Multidetector CT imaging of the chest was performed using the standard protocol during bolus administration of intravenous contrast. Multiplanar CT image reconstructions and MIPs were obtained to evaluate the vascular anatomy. RADIATION DOSE REDUCTION: This exam was performed according to the departmental dose-optimization program which includes automated exposure control, adjustment of the mA and/or kV according to patient size and/or use of iterative reconstruction technique. CONTRAST:  80mL OMNIPAQUE IOHEXOL 350 MG/ML SOLN COMPARISON:  Chest x-ray from earlier in the same FINDINGS: Cardiovascular: No aneurysmal dilatation of the thoracic aorta is noted. Changes of prior coronary bypass grafting are identified. The pulmonary artery shows a normal branching pattern bilaterally. No filling defect to suggest pulmonary embolism is noted. Diffuse coronary calcifications are noted. Left chest wall port is noted. Mediastinum/Nodes: Thoracic inlet is within normal limits. The esophagus as visualized is within normal limits. No hilar or mediastinal adenopathy is noted. Lungs/Pleura: Small left pleural effusion is noted. A 13 mm somewhat spiculated lesion is noted in the  right upper lobe. This has decreased in size from the prior exam at which time it measured up to 15 mm. No new parenchymal nodules are seen. No pneumothorax is noted. Upper Abdomen: Visualized upper abdomen demonstrates mild ascites and mild changes of anasarca. No other focal abnormality is noted. Musculoskeletal: No acute rib abnormality is noted. No compression deformity is seen. Review of the MIP images confirms the above findings. IMPRESSION: No evidence of pulmonary emboli. 13 mm spiculated lesion in the right upper lobe decreased in size from the prior exam at which time  it measured 15 mm. Small left pleural effusion. Electronically Signed   By: Alcide Clever M.D.   On: 09/28/2022 22:36   DG Chest 2 View  Result Date: 09/28/2022 CLINICAL DATA:  History of non-small cell carcinoma with chest pain, initial encounter EXAM: CHEST - 2 VIEW COMPARISON:  08/18/2021, chest CT from 08/02/2022 FINDINGS: Cardiac shadow is within normal limits. Postsurgical changes are again seen. Left chest wall port is noted. Lungs are well aerated bilaterally. Previously seen right upper lobe nodule is not well appreciated on today's exam. IMPRESSION: No acute abnormality noted. Known right upper lobe nodule is not well appreciated on today's exam. Electronically Signed   By: Alcide Clever M.D.   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 She is currently undergoing systemic chemotherapy with carboplatin for AUC of 5, Alimta 500 Mg/M2 and Keytruda 200 Mg IV every 3 weeks status post 19 cycles.  Assessment and  Plan    Adenocarcinoma Patient has been on Carboplatin, Alimta, and Keytruda since August 2023. Currently on maintenance treatment. Recent hospitalization for low potassium, magnesium, and iron causing heart palpitations and fatigue. -Hold chemotherapy and immunotherapy today due to recent hospitalization and current dental abscess. -Plan to resume treatment once dental abscess is resolved and anemia has improved.  Anemia Hemoglobin is low at 7.7. -Administer 1-2 units of packed red blood cells today. -Order anemia panel to investigate cause of persistent anemia.  Dental Abscess Patient has a dental abscess that has not been treated. Currently on Doxycycline and Flagyl. -Encourage patient to see dentist as soon as possible for tooth extraction. -Continue Doxycycline and Flagyl as prescribed.  Constipation Patient has been prescribed Dulcolax, Colace, and Senokot but has had difficulty obtaining these medications. -Continue current regimen as prescribed.  Lower extremity edema and skin darkening Patient reports worsening lower extremity edema and skin darkening. -Consider Lasix for edema management, acknowledging potential impact on kidney function. -Explain that skin darkening may improve after cessation of chemotherapy.  Hypertension Patient reports blood pressure drops with Carvedilol. -Advise patient to contact cardiologist for potential medication adjustment.  Follow-up in 2 weeks.    For the swelling of the lower extremities, she will continue her current treatment with Lasix for now.  She was also advised to use compression stocking. For the lacrimation of the eye, she was advised to use over-the-counter saline eyedrops. For the dyspepsia and epigastric pain, she will continue her current treatment with Protonix. For the pain management she is followed by the palliative care team and currently on Xtampza ER 13.5 mg every 12 hours in addition to Oxy IR for breakthrough  pain. She was advised to call immediately if she has any other concerning symptoms in the interval. The patient voices understanding of current disease status and treatment options and is in agreement with the current care plan.  All questions were answered. The patient knows to call the clinic with any problems, questions or concerns. We can certainly see the patient much sooner if necessary. The total time spent in the appointment was 30 minutes.  Disclaimer: This note was dictated with voice  recognition software. Similar sounding words can inadvertently be transcribed and may not be corrected upon review.

## 2022-10-06 NOTE — Progress Notes (Signed)
Blood orders released.

## 2022-10-06 NOTE — Progress Notes (Signed)
Nutrition Assessment  Reason for Assessment: Wt loss   ASSESSMENT: 57 year old female with stage IV lung cancer. She is currently receiving immunotherapy and blood products. Pt is under the care of Dr. [name].   Past medical history includes DM2, CAD s/p CABG (2023), COPD, sickle cell trait, bipolar, depression  9/17-9/20 admission - chest pain  Met with pt in infusion. Brother is present for visit today. She is receiving blood products today. Patient reports she is taking a break from therapy due to side effects. Pt reports appetite is picking up now that she is back at home. Pt did not care for hospital food. She typically eats 2 meals/day. Pt has starting drinking Ensure Complete in the morning as does not eat breakfast. She enjoys salads. Pt reports that she fell yesterday, skinned her knee. She has never had a fall before. States both lower extremity are weak and shaky. She is pending start of home health PT.   Nutrition Focused Physical Exam:   Orbital Region: moderate Buccal Region: mild Temple Region: mild Dorsal Hand: moderate Patellar Region: moderate Posterior Calf Region: severe Edema (RD assessment): +1 BLE Hair: reviewed  Eyes: reviewed  Mouth: reviewed  Skin: reviewed (darkening of right foot, scar mid chest s/p CABG) Nails: reviewed    Medications: lipitor, buspar, coreg, colace, cymbalta, ferrous sulfate, folvite, lantus 10 units, lasix 40mg  (PRN), ativan, megace, flagyl, zofran, oxycodone, protonix, valtrex, senna-s  Labs: Hgb 7.7, glucose 164, Cr 1.25, albumin 3.1 09/29/22 HgbA1c - 5.4 (WNL)  Anthropometrics:   Height: 5'7" Weight: 173 lb 14.4 oz UBW: 210 lb (04/21/22) BMI: 27.24   NUTRITION DIAGNOSIS: Inadequate oral intake related to cancer and associated treatment side effects as evidenced by 18% (37 lb) wt decrease from usual wt in 5 months - this is severe for time frame   MALNUTRITION DIAGNOSIS: Patient meets criteria for severe malnutrition  related to chronic disease (stage IV lung cancer, COPD, CAD) as evidenced by mild/moderate fat and muscle depletion, 18% wt loss in 5 months   INTERVENTION:  Educated on small frequent meals and snacks vs 3 meals/day - high calorie/high protein snack ideas provided Educated on soft moist high protein foods for ease of intake - handout with ideas provided  Continue drinking Ensure Complete/equivalent 1-2/day - samples + coupons provided Encouraged high protein snack following PT   MONITORING, EVALUATION, GOAL: Patient will tolerate increased calories and protein to minimize further wt loss    NEXT VISIT: Wednesday November 6 during infusion

## 2022-10-07 LAB — TYPE AND SCREEN
ABO/RH(D): O POS
Antibody Screen: NEGATIVE
Unit division: 0
Unit division: 0

## 2022-10-07 LAB — BPAM RBC
Blood Product Expiration Date: 202410222359
Blood Product Expiration Date: 202410222359
ISSUE DATE / TIME: 202409251309
ISSUE DATE / TIME: 202409251309
Unit Type and Rh: 5100
Unit Type and Rh: 5100

## 2022-10-08 ENCOUNTER — Telehealth: Payer: Self-pay | Admitting: Internal Medicine

## 2022-10-08 ENCOUNTER — Telehealth: Payer: Self-pay

## 2022-10-08 ENCOUNTER — Other Ambulatory Visit: Payer: Medicaid Other

## 2022-10-08 NOTE — Patient Outreach (Signed)
Care Management  Transitions of Care Program Managed Medicaid Transitions of Care week 1  10/08/2022 Name: [name] MRN: [id number] DOB: March 20, 1965  Subjective: Cassidy [name] is a 57 y.o. year old female who is a primary care patient of [name], MD. The Care Management team was unable to reach the patient by phone to assess and address transitions of care needs.   Plan: Additional outreach attempts will be made to reach the patient enrolled in the [facility] Program (Post Inpatient/ED Visit).   Alyse Low, RN, BA, [facility], CRRN [facility] Population Health Care Management Coordinator, Transition of Care Ph # [phone]

## 2022-10-08 NOTE — Telephone Encounter (Signed)
Scheduled per 09/25 los, patient has been called and voicemail was left regarding upcoming appointments.

## 2022-10-13 NOTE — Progress Notes (Signed)
Cardiology Clinic Note   Patient Name: ALIANNA WURSTER Date of Encounter: 10/15/2022  Primary Care Provider:  [name], MD Primary Cardiologist:  Reatha Harps, MD  Patient Profile    LAKETIA VICKNAIR 57 year old female presents the clinic today for follow-up evaluation of her NSTEMI and coronary artery disease.  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  s/p PCTA to dRCA and ostial RPAV-PLA vessel + PDA branch (05/2012)  b. [place] s/p DES to mLAD - Resolute DES 3.0 x 22 (3.7mm -->3.3 mm)   Bipolar depression (HCC)    Breast abscess    a. right side.    COPD (chronic obstructive pulmonary disease) (HCC)    Depression    Diabetic peripheral neuropathy (HCC)    Fibromyalgia    Genital warts    GERD (gastroesophageal reflux disease)    History of hiatal hernia    HLD (hyperlipidemia)    Hypertension    Lung cancer (HCC)    Pain in limb    a. LE VENOUS DUPLEX, 02/05/2009 - no evidence of deep vein thrombosis, Baker's cyst   Pneumonia    PTSD (post-traumatic stress disorder)    Sickle cell trait (HCC)    Tobacco abuse    Tooth caries    Type II diabetes mellitus (HCC)    Past Surgical History:  Procedure Laterality Date   BLADDER SURGERY  1980   "TVT"   BLADDER SURGERY  2003   "TVT"   BREAST EXCISIONAL BIOPSY     BREAST SURGERY Right    I&D for multiple abscesses   CARDIAC CATHETERIZATION     CORONARY ARTERY BYPASS GRAFT N/A 06/30/2021   Procedure: CORONARY ARTERY BYPASS GRAFTING (CABG) x3 USING LEFT INTERNAL MAMMARY ARTERY,  RIGHT RADIAL ARTERY AND LEFT GREATER SAPHENOUS VEIN;  Surgeon: Corliss Skains, MD;  Location: [facility];  Service: Open Heart Surgery;  Laterality: N/A;   cutting balloon     ENDOVEIN HARVEST OF GREATER SAPHENOUS VEIN Left  06/30/2021   Procedure: ENDOVEIN HARVEST OF GREATER SAPHENOUS VEIN;  Surgeon: Corliss Skains, MD;  Location: [facility];  Service: Open Heart Surgery;  Laterality: Left;   EYE SURGERY Left    laser surgery   FINE NEEDLE ASPIRATION  08/21/2021   Procedure: FINE NEEDLE ASPIRATION (FNA) LINEAR;  Surgeon: Josephine Igo, DO;  Location: [facility];  Service: Pulmonary;;   INCISE AND DRAIN ABCESS  ; 04/26/11; 08/13/11   right breast   INCISION AND DRAINAGE ABSCESS Right 05/09/2012   Procedure: INCISION AND DRAINAGE RIGHT BREAST ABSCESS;  Surgeon: Wilmon Arms. Corliss Skains, MD;  Location: [facility];  Service: General;  Laterality: Right;   INCISION AND DRAINAGE ABSCESS Right 02/08/2014   Procedure: INCISION AND DRAINAGE RIGHT BREAST ABSCESS;  Surgeon: Manus Rudd, MD;  Location: [facility];  Service: General;  Laterality: Right;   INCISION AND DRAINAGE ABSCESS Right 06/14/2014   Procedure: INCISION AND DRAINAGE RIGHT BREAST ABSCESS;  Surgeon: Manus Rudd, MD;  [facility]: [facility];  Service: General;  Laterality: Right;   IR IMAGING GUIDED PORT INSERTION  12/08/2021   IRRIGATION AND DEBRIDEMENT ABSCESS  12/19/2010   Procedure: IRRIGATION AND DEBRIDEMENT ABSCESS;  Surgeon: Emelia Loron, MD;  Location: [facility];  Service: General;  Laterality: Right;   IRRIGATION AND DEBRIDEMENT ABSCESS  08/13/2011   Procedure: MINOR INCISION AND DRAINAGE OF ABSCESS;  Surgeon: Cherylynn Ridges, MD;  [facility]: [facility];  Service: General;  Laterality: Right;  Right Breast    IRRIGATION AND DEBRIDEMENT ABSCESS  11/17/2011   Procedure: IRRIGATION AND DEBRIDEMENT ABSCESS;  Surgeon: Wilmon Arms. Corliss Skains, MD;  [facility]: [facility];  Service: General;  Laterality: Right;  irrigation and debridement right recurrent breast abscess   LARYNX SURGERY     LEFT HEART CATH AND CORONARY ANGIOGRAPHY N/A 04/09/2021   Procedure: LEFT HEART CATH AND CORONARY ANGIOGRAPHY;  Surgeon: Corky Crafts, MD;  Location[facility] INVASIVE CV LAB;  Service: Cardiovascular;   Laterality: N/A;   LEFT HEART CATHETERIZATION WITH CORONARY ANGIOGRAM N/A 05/29/2012   Procedure: LEFT HEART CATHETERIZATION WITH CORONARY ANGIOGRAM & PTCA;  Surgeon: Lennette Bihari, MD;  Location: [facility] CATH LAB;  Service: Cardiovascular; Bifurcation dRCA-RPAD/PLA & rPDA  PTCA.   LEFT HEART CATHETERIZATION WITH CORONARY ANGIOGRAM N/A 11/08/2013   Procedure: LEFT HEART CATHETERIZATION WITH CORONARY ANGIOGRAM and Coronary Stent Intervention;  Surgeon: Marykay Lex, MD;  Location: [facility] CATH LAB;  Service: Cardiovascular; mLAD 80% (FFR 0.73) - resolute DES 3.0 x 22 mm (postdilated to 3.3 mm->3.5 mm)   NM MYOVIEW LTD  01/26/2009   Normal study, no evidence of ischemia, EF 67%   ployp removed from voice box 03/30/12     RADIAL ARTERY HARVEST Right 06/30/2021   Procedure: RADIAL ARTERY HARVEST;  Surgeon: Corliss Skains, MD;  [facility];  Service: Open Heart Surgery;  Laterality: Right;   REFRACTIVE SURGERY  ~ 2010   right   TEE WITHOUT CARDIOVERSION N/A 06/30/2021   Procedure: TRANSESOPHAGEAL ECHOCARDIOGRAM (TEE);  Surgeon: Corliss Skains, MD;  Location: [facility] OR;  Service: Open Heart Surgery;  Laterality: N/A;   TONSILLECTOMY  1988   VIDEO BRONCHOSCOPY WITH ENDOBRONCHIAL ULTRASOUND Bilateral 08/21/2021   Procedure: VIDEO BRONCHOSCOPY WITH ENDOBRONCHIAL ULTRASOUND;  Surgeon: Josephine Igo, DO;  Location: [facility];  Service: Pulmonary;  Laterality: Bilateral;    Allergies  Allergies  Allergen Reactions   Amoxil [Amoxicillin] Hives    Has patient had a PCN reaction causing immediate rash, facial/tongue/throat swelling, SOB or lightheadedness with hypotension: Yes Has patient had a PCN reaction causing severe rash involving mucus membranes or skin necrosis: No Has patient had a PCN reaction that required hospitalization No Has patient had a PCN reaction occurring within the last 10 years: Yes If all of the above answers are "NO", then may proceed with Cephalosporin use.   Chantix  [Varenicline] Other (See Comments)    Nightmares Hallucinations     Suboxone [Buprenorphine Hcl-Naloxone Hcl] Nausea And Vomiting   Emend [Fosaprepitant Dimeglumine] Itching    See notes for transfusion on 8/23. Pt reported new onset of itching after Emend infusion was started. 25mg  Benadryl IV given per MD. Pt tolerated remaining infusion well.     History of Present Illness    MADELYNN MALSON has a PMH of coronary artery disease status post CABG in 2023, hypertension, HLD, diabetes, stage IV lung cancer on chemotherapy, sickle cell trait, COPD.  She was admitted on 09/29/2022 and was discharged on 10/01/2022.  She was admitted for evaluation of NSTEMI.  She was receiving chemotherapy treatment and had recurrent episode of nausea vomiting and abdominal pain.  Her episode was accompanied by palpitations and nonexertional chest pain.  EKG showed acute ST/T wave changes.  Her cardiac troponins were mildly elevated  and flat at 183, 118, 192, 173.  She underwent stress testing which showed no reversible ischemia and previous infarct.  Her EF was noted to be 67%.  No wall motion abnormalities were noted.  It was unsure whether her discomfort was related to undiagnosed sleep apnea or chemotherapy induced cardiomyopathy.  Her echocardiogram 09/29/2022 showed an LVEF of 60 to 65%, G1 DD, mild mitral valve regurgitation and no other significant valvular abnormalities.  She was discharged in stable condition on 10/01/2022.  She presents to the clinic today for follow-up evaluation and states she has not had any further episodes of chest discomfort.  She reports that she was not taking her carvedilol correctly.  She has not taken it twice daily.  She reports that she has taken 2 days of Lasix for lower extremity swelling.  This is prescribed by her PCP.  She has bilateral lower extremity +2 pitting edema to her knee.  I will have her take another 5 days of furosemide 40 mg and potassium 20 mill equivalents.  We will  plan for repeat BMP next week and follow-up in 1 to 2 weeks.  We reviewed the importance of low-salt diet, daily weights, no fluid restriction..  Today she denies chest pain, shortness of breath, fatigue, palpitations, melena, hematuria, hemoptysis, diaphoresis, weakness, presyncope, syncope, orthopnea, and PND.    Home Medications    Prior to Admission medications   Medication Sig Start Date End Date Taking? Authorizing Provider  aspirin EC 81 MG tablet Take 81 mg by mouth daily.    [provider]  atorvastatin (LIPITOR) 40 MG tablet TAKE 1 TABLET (40 MG TOTAL) BY MOUTH DAILY. 04/23/22   [name], DO  bisacodyl (DULCOLAX) 10 MG suppository Place 1 suppository (10 mg total) rectally daily as needed for severe constipation. Patient not taking: Reported on 10/04/2022 10/01/22   Marguerita Merles Latif, DO  busPIRone (BUSPAR) 15 MG tablet Take 0.5 tablets (7.5 mg total) by mouth 2 (two) times daily. 05/20/22   [name], DO  Carboxymethylcellul-Glycerin (LUBRICATING EYE DROPS OP) Place 1 drop into both eyes daily as needed (dry eyes).    [provider]  carvedilol (COREG) 12.5 MG tablet Take 1 tablet (12.5 mg total) by mouth 2 (two) times daily with a meal. 10/01/22   Sheikh, Omair Latif, DO  diclofenac Sodium (VOLTAREN) 1 % GEL APPLY 1 APPLICATION TOPICALLY 3 TIMES DAILY AS NEEDED FOR PAIN 07/03/22   O'Neal, Ronnald Ramp, MD  docusate sodium (COLACE) 100 MG capsule Take 1 capsule (100 mg total) by mouth daily. 10/02/22   Marguerita Merles Latif, DO  DULoxetine (CYMBALTA) 30 MG capsule Take 1 capsule (30 mg total) by mouth daily. 06/23/22   Antony Madura, MD  ferrous sulfate 325 (65 FE) MG tablet Take 325 mg by mouth daily with breakfast.    [provider]  fluticasone (FLONASE) 50 MCG/ACT nasal spray PLACE 1 SPRAY INTO BOTH NOSTRILS DAILY AS NEEDED (CONGESTION). 05/13/21   [name], DO  fluticasone-salmeterol (ADVAIR) 100-50 MCG/ACT AEPB Inhale 1 puff  into the lungs 2 (two) times daily. 11/23/21   [name], DO  folic acid (FOLVITE) 1 MG tablet Take 1 tablet (1 mg total) by mouth daily. 03/31/22   Si Gaul, MD  furosemide (LASIX) 40 MG tablet Take 1 tablet (40 mg total) by mouth daily as needed. If you gain 3 lbs in 24 hours or 5 lbs in 1 week 07/17/21   Barrett, Erin R, PA-C  glucose blood (ACCU-CHEK AVIVA  PLUS) test strip Use as instructed 02/02/22   [name], DO  hyoscyamine (LEVSIN SL) 0.125 MG SL tablet Place 1 tablet (0.125 mg total) under the tongue every 6 (six) hours as needed. For abdominal cramping 03/10/22   Heilingoetter, Cassandra L, PA-C  insulin glargine (LANTUS) 100 UNIT/ML Solostar Pen Inject 24 Units into the skin every morning. 05/03/22   [name], DO  Insulin Pen Needle 32G X 4 MM MISC 1 Needle by Does not apply route in the morning and at bedtime. 02/02/22   [name], DO  ipratropium (ATROVENT HFA) 17 MCG/ACT inhaler Inhale 2 puffs into the lungs every 4 (four) hours as needed for wheezing (COPD).    [provider]  lactulose (CHRONULAC) 10 GM/15ML solution Take 30 mLs (20 g total) by mouth 2 (two) times daily as needed for mild constipation, moderate constipation or severe constipation. 03/10/22   Pickenpack-Cousar, Arty Baumgartner, NP  lidocaine-prilocaine (EMLA) cream APPLY 1 APPLICATION TOPICALLY AS NEEDED. 02/01/22   Heilingoetter, Cassandra L, PA-C  LORazepam (ATIVAN) 0.5 MG tablet Take 1 tablet (0.5 mg total) by mouth every 8 (eight) hours as needed for anxiety (nausea). 03/10/22   Pickenpack-Cousar, Arty Baumgartner, NP  megestrol (MEGACE) 20 MG tablet Take 1 tablet (20 mg total) by mouth 2 (two) times daily. 08/26/22   Pickenpack-Cousar, Arty Baumgartner, NP  nicotine polacrilex (NICORETTE) 2 MG gum Take 1 each (2 mg total) by mouth as needed for smoking cessation. 06/03/21   [name], DO  nitroGLYCERIN (NITROSTAT) 0.4 MG SL tablet Place 1 tablet (0.4 mg total) under the tongue every  5 (five) minutes as needed for chest pain. 04/24/20   [name], DO  ondansetron (ZOFRAN-ODT) 8 MG disintegrating tablet Take 1 tablet (8 mg total) by mouth every 8 (eight) hours as needed for nausea or vomiting. 01/28/22   Heilingoetter, Cassandra L, PA-C  oxyCODONE 10 MG TABS Take 1 tablet (10 mg total) by mouth every 4 (four) hours as needed for severe pain. 08/26/22   Pickenpack-Cousar, Arty Baumgartner, NP  oxyCODONE ER (XTAMPZA ER) 13.5 MG C12A Take 13.5 mg by mouth every 12 (twelve) hours. 09/14/22   Pickenpack-Cousar, Arty Baumgartner, NP  pantoprazole (PROTONIX) 40 MG tablet Take 1 tablet (40 mg total) by mouth 2 (two) times daily. 06/23/22   Zehr, Shanda Bumps D, PA-C  polyethylene glycol powder (GAVILAX) 17 GM/SCOOP powder TAKE ONE CAPFUL (17 GRAMS )BY MOUTH DAILY AS NEEDED FOR MILD OR MODERATE CONSTIPATION. Patient not taking: Reported on 10/04/2022 04/13/22   [name], DO  potassium chloride SA (KLOR-CON M) 20 MEQ tablet Take 1 tablet (20 mEq total) by mouth daily as needed. On days you take Lasix Patient not taking: Reported on 10/04/2022 07/17/21   Barrett, Rae Roam, PA-C  prochlorperazine (COMPAZINE) 10 MG tablet TAKE 1 TABLET (10 MG TOTAL) BY MOUTH EVERY 6 (SIX) HOURS AS NEEDED FOR NAUSEA OR VOMITING. Patient not taking: Reported on 10/04/2022 05/12/22   Heilingoetter, Cassandra L, PA-C  senna-docusate (SENOKOT-S) 8.6-50 MG tablet Take 1 tablet by mouth 2 (two) times daily. 10/01/22   Marguerita Merles Latif, DO  valACYclovir (VALTREX) 1000 MG tablet Take for 3 days prn each outbreak. Patient taking differently: Take 1,000 mg by mouth daily. Take for 3 days prn each outbreak. 07/08/22   Brock Bad, MD    Family History    Family History  Problem Relation Age of Onset   Esophageal cancer Mother    COPD Mother    Cancer Mother  lymphoma   Heart disease Mother    Stomach cancer Mother    Hyperlipidemia Father    Heart disease Father    Cancer Maternal Aunt        breast, colon    Crohn's disease Maternal Aunt    Hypertension Maternal Grandmother    Diabetes Maternal Grandmother    Hypertension Brother    Rectal cancer Neg Hx    Liver cancer Neg Hx    Colon cancer Neg Hx    Breast cancer Neg Hx    She indicated that her mother is deceased. She indicated that her father is deceased. She indicated that the status of her brother is unknown. She indicated that her maternal grandmother is deceased. She indicated that her maternal grandfather is deceased. She indicated that her paternal grandmother is deceased. She indicated that her paternal grandfather is deceased. She indicated that her maternal aunt is deceased. She indicated that the status of her neg hx is unknown.  Social History    Social History   Socioeconomic History   Marital status: Single    Spouse name: Not on file   Number of children: 1   Years of education: Not on file   Highest education level: Not on file  Occupational History   Occupation: homemaker   Occupation: disabled  Tobacco Use   Smoking status: Former    Current packs/day: 0.50    Average packs/day: 0.5 packs/day for 43.8 years (21.9 ttl pk-yrs)    Types: Cigarettes    Start date: 01/12/1979   Smokeless tobacco: Never   Tobacco comments:    PT STATES THAT SHE CHEWS THE GUM FOR SMOKING AND NO LONGER SMOKES  Vaping Use   Vaping status: Never Used  Substance and Sexual Activity   Alcohol use: No    Alcohol/week: 0.0 standard drinks of alcohol    Comment: recovering addict; 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   Social Determinants of Health   Financial Resource Strain: High Risk (09/08/2021)   Overall Financial Resource Strain (CARDIA)    Difficulty of Paying Living Expenses: Very hard  Food Insecurity: No Food Insecurity (10/04/2022)   Hunger Vital Sign    Worried About Running Out of Food in the Last Year: Never true    Ran Out of Food in the Last Year: Never true  Transportation Needs: No Transportation Needs (10/04/2022)   PRAPARE - Administrator, Civil Service (Medical): No    Lack of Transportation (Non-Medical): No  Physical Activity: Not on file  Stress: Not on file  Social Connections: Not on file  Intimate Partner Violence: Not At Risk (10/04/2022)   Humiliation, Afraid, Rape, and Kick questionnaire    Fear of Current or Ex-Partner: No    Emotionally Abused: No    Physically Abused: No    Sexually Abused: No     Review of Systems    General:  No chills, fever, night sweats or weight changes.  Cardiovascular:  No chest pain, dyspnea on  exertion, edema, orthopnea, palpitations, paroxysmal nocturnal dyspnea. Dermatological: No rash, lesions/masses Respiratory: No cough, dyspnea Urologic: No hematuria, dysuria Abdominal:   No nausea, vomiting, diarrhea, bright red blood per rectum, melena, or hematemesis Neurologic:  No visual changes, wkns, changes in mental status. All other systems reviewed and are otherwise negative except as noted above.  Physical Exam    VS:  BP 92/60 (BP Location: Left Arm, Patient Position: Sitting, Cuff Size: Normal)   Pulse 76   Ht 5\' 7"  (1.702 m)   Wt 186 lb (84.4 kg)   LMP 06/12/2016 (Approximate)   BMI 29.13 kg/m  , BMI Body mass index is 29.13 kg/m. GEN: Well nourished, well developed, in no acute distress. HEENT: normal. Neck: Supple, no JVD, carotid bruits, or masses. Cardiac: RRR, no murmurs, rubs, or gallops. No clubbing, cyanosis, +2 pitting bilateral lower extremity to knee edema.  Radials/DP/PT  2+ and equal bilaterally.  Respiratory:  Respirations regular and unlabored, clear to auscultation bilaterally. GI: Soft, nontender, nondistended, BS + x 4. MS: no deformity or atrophy. Skin: warm and dry, no rash. Neuro:  Strength and sensation are intact. Psych: Normal affect.  Accessory Clinical Findings    Recent Labs: 08/25/2022: TSH 8.471 09/29/2022: B Natriuretic Peptide 112.4 10/01/2022: Magnesium 1.7 10/06/2022: ALT 13; BUN 16; Creatinine 1.25; Hemoglobin 7.7; Platelet Count 316; Potassium 3.8; Sodium 135   Recent Lipid Panel    Component Value Date/Time   CHOL 131 05/15/2021 1615   CHOL 132 08/07/2012 0905   TRIG 77 05/15/2021 1615   TRIG 73 08/07/2012 0905   HDL 60 05/15/2021 1615   HDL 42 08/07/2012 0905   CHOLHDL 2.2 05/15/2021 1615   CHOLHDL 4 11/29/2014 1140   VLDL 23.8 11/29/2014 1140   LDLCALC 56 05/15/2021 1615   LDLCALC 75 08/07/2012 0905         ECG personally reviewed by me today-EKG Interpretation Date/Time:  Friday October 15 2022 08:31:02 EDT Ventricular Rate:  76 PR Interval:  162 QRS Duration:  74 QT Interval:  372 QTC Calculation: 418 R Axis:   0  Text Interpretation: Normal sinus rhythm Possible Left atrial enlargement Low voltage QRS Confirmed by Edd Fabian [phone]) on 10/15/2022 8:36:21 AM EKG Interpretation Date/Time:  Friday October 15 2022 08:31:02 EDT Ventricular Rate:  76 PR Interval:  162 QRS Duration:  74 QT Interval:  372 QTC Calculation: 418 R Axis:   0  Text Interpretation: Normal sinus rhythm Possible Left atrial enlargement Low voltage QRS Confirmed by Edd Fabian [phone]) on 10/15/2022 8:36:21 AM   Nuclear stress test 09/29/2022  IMPRESSION: 1. No reversible ischemia. Fixed defect in the inferolateral wall suggest prior infarction.   2. Normal left ventricular wall motion.   3. Left ventricular ejection fraction 67%   4. Non invasive risk stratification*: Low   *2012 Appropriate Use Criteria for Coronary  Revascularization Focused Update: J Am Coll Cardiol. 2012;59(9):857-881. http://content.dementiazones.com.aspx?articleid=[email or web address]   Echocardiogram 09/29/2022 1. Left ventricular ejection fraction, by estimation, is 60 to 65%. The  left ventricle has normal function. The left ventricle has no regional  wall motion abnormalities. There is mild concentric left ventricular  hypertrophy. Left ventricular diastolic  parameters are consistent with Grade I diastolic dysfunction (impaired  relaxation).   2. Right ventricular systolic function is moderately reduced. The right  ventricular size is not well visualized.   3. There is prominent systolic anterior motion of the mitral valve, but  there is no LV outflow obstruction (Valsalva maneuver was not performed).  The  mitral valve is normal in structure. Mild mitral valve regurgitation.  No evidence of mitral stenosis.   4. The aortic valve is normal in structure. Aortic valve regurgitation is  not visualized. No aortic stenosis is present.   5. The inferior vena cava is normal in size with greater than 50%  respiratory variability, suggesting right atrial pressure of 3 mmHg.       Assessment & Plan   1.  NSTEMI-denies further episodes of chest discomfort.  Reassuring stress testing and echocardiogram.  Details above. No plans for further ischemic evaluation. Heart healthy low-sodium diet Continue to monitor  Bilateral lower extremity edema, diastolic dysfunction-+2 pitting bilateral lower extremity to knee.  Has taken 2 days of furosemide. Continue furosemide and potassium for another 5 days Repeat BMP in 1 week Heart healthy low-sodium diet Fluid restriction Daily weights-weight log  Coronary artery disease-status post CABG 2023. Heart healthy low-sodium diet Maintain physical activity as tolerated Continue current medical therapy  Hyperlipidemia-LDL 56 on 05/15/21. High-fiber diet Continue atorvastatin, aspirin  Essential  hypertension-BP today 92/60. Continue carvedilol Maintain blood pressure log  Stage IV lung cancer-on chemotherapy Following with oncology  Disposition: Follow-up with Dr. Flora Lipps or me in 3-4 months.   Thomasene Ripple. Jemia Fata NP-C     10/15/2022, 8:36 AM [facility] [address] Office [phone] Fax [phone]    I spent 14 minutes examining this patient, reviewing medications, and using patient centered shared decision making involving her cardiac care.  Prior to her visit I spent greater than 20 minutes reviewing her past medical history,  medications, and prior cardiac tests.

## 2022-10-15 ENCOUNTER — Telehealth: Payer: Self-pay

## 2022-10-15 ENCOUNTER — Ambulatory Visit: Payer: Medicaid Other | Attending: General Practice | Admitting: General Practice

## 2022-10-15 ENCOUNTER — Encounter: Payer: Self-pay | Admitting: General Practice

## 2022-10-15 ENCOUNTER — Other Ambulatory Visit: Payer: Medicaid Other

## 2022-10-15 VITALS — BP 92/60 | HR 76 | Ht 67.0 in | Wt 186.0 lb

## 2022-10-15 DIAGNOSIS — I10 Essential (primary) hypertension: Secondary | ICD-10-CM

## 2022-10-15 DIAGNOSIS — I214 Non-ST elevation (NSTEMI) myocardial infarction: Secondary | ICD-10-CM | POA: Diagnosis not present

## 2022-10-15 DIAGNOSIS — E785 Hyperlipidemia, unspecified: Secondary | ICD-10-CM | POA: Diagnosis not present

## 2022-10-15 NOTE — Patient Outreach (Signed)
Care Management  Transitions of Care Program Managed Medicaid Transitions of Care week 2  10/15/2022 Name: Cassidy [name] MRN: [id number] DOB: [birth year]  Subjective: Cassidy [name] is a 57 y.o. year old female who is a primary care patient of [name], MD. The Care Management team was unable to reach the patient by phone to assess and address transitions of care needs.   Plan: Additional outreach attempts will be made to reach the patient enrolled in the [facility] Program (Post Inpatient/ED Visit).  Alyse Low, RN, BA, [facility], CRRN [facility] Coordinator, Transition of Care Ph # [phone]

## 2022-10-15 NOTE — Patient Instructions (Signed)
Medication Instructions:  CONTINUE LASIX FOR 5 MORE DAYS (TIL 10/21/22) *If you need a refill on your cardiac medications before your next appointment, please call your pharmacy*   Lab Work: BMET IN 1 WEEK If you have labs (blood work) drawn today and your tests are completely normal, you will receive your results only by: MyChart Message (if you have MyChart) OR A paper copy in the mail If you have any lab test that is abnormal or we need to change your treatment, we will call you to review the results.   Testing/Procedures: NO TESTING   Follow-Up: At [facility], you and your health needs are our priority.  As part of our continuing mission to provide you with exceptional heart care, we have created designated Provider Care Teams.  These Care Teams include your primary Cardiologist (physician) and Advanced Practice Providers (APPs -  Physician Assistants and Nurse Practitioners) who all work together to provide you with the care you need, when you need it.   Your next appointment:   1-2 week(s)  Provider:   Edd Fabian, NP    Other Instructions TAKE DAILY WEIGHT AND KEEP RECORD    [facility]:

## 2022-10-18 NOTE — Progress Notes (Unsigned)
Palliative Medicine [facility]  Telephone:([phone] [phone]   Name: Cassidy [name] Date: 10/18/2022 MRN: [id number]  DOB: November 01, 1965  Patient Care Team: [name], MD as PCP - General (Family Medicine) O'Neal, Ronnald Ramp, MD as PCP - Cardiology (Cardiology) Emelia Loron, MD as Consulting Physician (General Surgery) Marcos Eke, RN as Registered Nurse   INTERVAL HISTORY: Cassidy [name] is a 57 y.o. 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  Palliative ask to see for symptom management and goals of care.   SOCIAL HISTORY:    [name] reports that she has quit smoking. Her smoking use included cigarettes. She started smoking about 43 years ago. She has a 21.9 pack-year smoking history. She has never used smokeless tobacco. She reports that she does not currently use drugs. She reports that she does not drink alcohol.  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  s/p PCTA to dRCA and ostial RPAV-PLA vessel + PDA branch (05/2012)  b. [place] s/p DES to mLAD - Resolute DES 3.0 x 22 (3.26mm -->3.3 mm)   Bipolar depression (HCC)    Breast abscess    a. right side.    COPD (chronic obstructive pulmonary disease) (HCC)    Depression    Diabetic peripheral neuropathy (HCC)    Fibromyalgia    Genital warts    GERD (gastroesophageal reflux disease)    History of hiatal hernia    HLD (hyperlipidemia)    Hypertension    Lung cancer (HCC)    Pain in limb    a. LE VENOUS DUPLEX, 02/05/2009 - no  evidence of deep vein thrombosis, Baker's cyst   Pneumonia    PTSD (post-traumatic stress disorder)    Sickle cell trait (HCC)    Tobacco abuse    Tooth caries    Type II diabetes mellitus (HCC)     ALLERGIES:  is allergic to amoxil [amoxicillin], chantix [varenicline], suboxone [buprenorphine hcl-naloxone hcl], and emend [fosaprepitant dimeglumine].  MEDICATIONS:  Current Outpatient Medications  Medication Sig Dispense Refill   aspirin EC 81 MG tablet Take 81 mg by mouth daily.     atorvastatin (LIPITOR) 40 MG tablet TAKE 1 TABLET (40 MG TOTAL) BY MOUTH DAILY. 90 tablet 3   bisacodyl (DULCOLAX) 10 MG suppository Place 1 suppository (10 mg total) rectally daily as needed for severe constipation. 12 suppository 0   busPIRone (BUSPAR) 15 MG tablet Take 0.5 tablets (7.5 mg total) by mouth 2 (two) times daily. 60 tablet 3   Carboxymethylcellul-Glycerin (LUBRICATING EYE DROPS OP) Place 1 drop into both eyes daily as needed (dry eyes).     carvedilol (COREG) 12.5 MG tablet Take 1 tablet (12.5 mg total) by mouth 2 (two) times daily with a meal. 180 tablet 1   diclofenac Sodium (VOLTAREN) 1 % GEL APPLY 1 APPLICATION TOPICALLY 3 TIMES DAILY AS NEEDED FOR PAIN 200 g 1   docusate sodium (COLACE) 100 MG capsule Take 1 capsule (100 mg total) by mouth daily. 10 capsule 0   DULoxetine (CYMBALTA) 30 MG capsule Take 1 capsule (  30 mg total) by mouth daily. 30 capsule 5   ferrous sulfate 325 (65 FE) MG tablet Take 325 mg by mouth daily with breakfast.     fluticasone (FLONASE) 50 MCG/ACT nasal spray PLACE 1 SPRAY INTO BOTH NOSTRILS DAILY AS NEEDED (CONGESTION). 16 g 1   fluticasone-salmeterol (ADVAIR) 100-50 MCG/ACT AEPB Inhale 1 puff into the lungs 2 (two) times daily. 1 each 3   folic acid (FOLVITE) 1 MG tablet Take 1 tablet (1 mg total) by mouth daily. 30 tablet 3   furosemide (LASIX) 40 MG tablet Take 1 tablet (40 mg total) by mouth daily as needed. If you gain 3 lbs in 24 hours or 5 lbs in 1 week 30  tablet 1   glucose blood (ACCU-CHEK AVIVA PLUS) test strip Use as instructed 50 each 1   hyoscyamine (LEVSIN SL) 0.125 MG SL tablet Place 1 tablet (0.125 mg total) under the tongue every 6 (six) hours as needed. For abdominal cramping 30 tablet 0   insulin glargine (LANTUS) 100 UNIT/ML Solostar Pen Inject 24 Units into the skin every morning. 3 mL 11   Insulin Pen Needle 32G X 4 MM MISC 1 Needle by Does not apply route in the morning and at bedtime. 120 each 2   ipratropium (ATROVENT HFA) 17 MCG/ACT inhaler Inhale 2 puffs into the lungs every 4 (four) hours as needed for wheezing (COPD).     lactulose (CHRONULAC) 10 GM/15ML solution Take 30 mLs (20 g total) by mouth 2 (two) times daily as needed for mild constipation, moderate constipation or severe constipation. 236 mL 0   lidocaine-prilocaine (EMLA) cream APPLY 1 APPLICATION TOPICALLY AS NEEDED. 30 g 2   LORazepam (ATIVAN) 0.5 MG tablet Take 1 tablet (0.5 mg total) by mouth every 8 (eight) hours as needed for anxiety (nausea). 30 tablet 0   megestrol (MEGACE) 20 MG tablet Take 1 tablet (20 mg total) by mouth 2 (two) times daily. 60 tablet 0   nicotine polacrilex (NICORETTE) 2 MG gum Take 1 each (2 mg total) by mouth as needed for smoking cessation. 100 tablet 0   nitroGLYCERIN (NITROSTAT) 0.4 MG SL tablet Place 1 tablet (0.4 mg total) under the tongue every 5 (five) minutes as needed for chest pain. 25 tablet 5   ondansetron (ZOFRAN-ODT) 8 MG disintegrating tablet Take 1 tablet (8 mg total) by mouth every 8 (eight) hours as needed for nausea or vomiting. 30 tablet 2   oxyCODONE 10 MG TABS Take 1 tablet (10 mg total) by mouth every 4 (four) hours as needed for severe pain. 90 tablet 0   oxyCODONE ER (XTAMPZA ER) 13.5 MG C12A Take 13.5 mg by mouth every 12 (twelve) hours. 60 capsule 0   pantoprazole (PROTONIX) 40 MG tablet Take 1 tablet (40 mg total) by mouth 2 (two) times daily. 60 tablet 5   polyethylene glycol powder (GAVILAX) 17 GM/SCOOP powder  TAKE ONE CAPFUL (17 GRAMS )BY MOUTH DAILY AS NEEDED FOR MILD OR MODERATE CONSTIPATION. 510 g 1   potassium chloride SA (KLOR-CON M) 20 MEQ tablet Take 1 tablet (20 mEq total) by mouth daily as needed. On days you take Lasix 30 tablet 1   prochlorperazine (COMPAZINE) 10 MG tablet TAKE 1 TABLET (10 MG TOTAL) BY MOUTH EVERY 6 (SIX) HOURS AS NEEDED FOR NAUSEA OR VOMITING. 30 tablet 0   senna-docusate (SENOKOT-S) 8.6-50 MG tablet Take 1 tablet by mouth 2 (two) times daily. 30 tablet 0   valACYclovir (VALTREX) 1000 MG tablet Take for  3 days prn each outbreak. (Patient taking differently: Take 1,000 mg by mouth daily. Take for 3 days prn each outbreak.) 30 tablet 11   No current facility-administered medications for this visit.    VITAL SIGNS: LMP 06/12/2016 (Approximate)  There were no vitals filed for this visit.  Estimated body mass index is 29.13 kg/m as calculated from the following:   Height as of 10/15/22: 5\' 7"  (1.702 m).   Weight as of 10/15/22: 186 lb (84.4 kg).   PERFORMANCE STATUS (ECOG) : 1 - Symptomatic but completely ambulatory  Assessment  NAD RRR Normal breathing pattern AAO x3  IMPRESSION: .   Neoplasm related pain Ms. Dexheimer reports that her pain is well controlled on current regimen. She is taking Xtampza 13 mg every 12 hours and Oxycodone 5-10 mg every 4 hours as needed for breakthrough pain. Does not require around the clock. Tolerating Cymbalta.   We will continue to closely monitor and support.   2.  Nausea Controlled with medication.   3. Constipation  Better controlled with regimen.  Last bowel movement on yesterday.  4. Goals of Care 09/21/22- Ms. Anes is emotional expressing understanding of cancer state. Shares emotionally her concerns of not "doing her best" right now. She knows that she has options to hold or discontinue treatments however expresses she is not ready to make this decision knowing her cancer would potentially progress. She is remaining  hopeful for some improvement in her symptoms and quality of life. Emotional support provided.   We discussed the importance of continued conversation with family and their medical providers regarding overall plan of care and treatment options, ensuring decisions are within the context of the patients values and GOCs.  PLAN: Xtampza 13 mg every 12 hours. Oxycodone IR 10 mg every 4-6 hours as needed for breakthrough pain. Senna-S and Miralax on a daily basis for the management of constipation. Zofran, Compazine, and Ativan as needed for nausea Ongoing emotional support and symptom management. Palliative will plan to see patient back in 3-4 weeks in collaboration with her oncology appointments.   Patient expressed understanding and was in agreement with this plan. She also understands that She can call the clinic at any time with any questions, concerns, or complaints.    Any controlled substances utilized were prescribed in the context of palliative care. PDMP has been reviewed.   Any controlled substances utilized were prescribed in the context of palliative care. PDMP has been reviewed.    Visit consisted of counseling and education dealing with the complex and emotionally intense issues of symptom management and palliative care in the setting of serious and potentially life-threatening illness.Greater than 50%  of this time was spent counseling and coordinating care related to the above assessment and plan.  Willette Alma, AGPCNP-BC  Palliative Medicine Team/[facility]  *Please note that this is a verbal dictation therefore any spelling or grammatical errors are due to the "Dragon [employer] One" system interpretation.

## 2022-10-20 ENCOUNTER — Telehealth: Payer: Self-pay

## 2022-10-20 NOTE — Telephone Encounter (Signed)
TC from pt to inform that she saw on MyChart that she was scheduled for chemo tomorrow, but that she and Dr. [name] had decided to hold her chemo for at least one month due to recent hospitalization and the need for dental work. Informed Nikki, infusion charge RN so she could be cancelled from schedule tomorrow, and she inquires about this d/t no mention of the break from chemo in Dr. Asa Lente notes. She includes Nikki, Palliative Care NP in conversation, and she verifies that pt is to taking a break from chemo. Pt requests that all chemo infusion appts in October be cancelled, as well as any appointment directly affiliated w/ chemo.

## 2022-10-21 ENCOUNTER — Inpatient Hospital Stay: Payer: Medicaid Other

## 2022-10-21 ENCOUNTER — Encounter: Payer: Self-pay | Admitting: Internal Medicine

## 2022-10-21 ENCOUNTER — Encounter: Payer: Self-pay | Admitting: Nurse Practitioner

## 2022-10-21 ENCOUNTER — Inpatient Hospital Stay: Payer: Medicaid Other | Attending: Internal Medicine | Admitting: Nurse Practitioner

## 2022-10-21 ENCOUNTER — Ambulatory Visit: Payer: Medicaid Other | Admitting: Internal Medicine

## 2022-10-21 DIAGNOSIS — R110 Nausea: Secondary | ICD-10-CM

## 2022-10-21 DIAGNOSIS — Z515 Encounter for palliative care: Secondary | ICD-10-CM | POA: Diagnosis not present

## 2022-10-21 DIAGNOSIS — K5903 Drug induced constipation: Secondary | ICD-10-CM | POA: Diagnosis not present

## 2022-10-21 DIAGNOSIS — C3491 Malignant neoplasm of unspecified part of right bronchus or lung: Secondary | ICD-10-CM | POA: Diagnosis not present

## 2022-10-21 DIAGNOSIS — R634 Abnormal weight loss: Secondary | ICD-10-CM | POA: Diagnosis not present

## 2022-10-21 DIAGNOSIS — G893 Neoplasm related pain (acute) (chronic): Secondary | ICD-10-CM | POA: Diagnosis not present

## 2022-10-21 DIAGNOSIS — R630 Anorexia: Secondary | ICD-10-CM | POA: Diagnosis not present

## 2022-10-21 MED ORDER — XTAMPZA ER 13.5 MG PO C12A: 13.5000 mg | EXTENDED_RELEASE_CAPSULE | Freq: Two times a day (BID) | ORAL | 0 refills | Status: DC

## 2022-10-21 MED ORDER — METOCLOPRAMIDE HCL 10 MG PO TABS: 10.0000 mg | ORAL_TABLET | Freq: Three times a day (TID) | ORAL | 2 refills | Status: DC | PRN

## 2022-10-21 MED ORDER — OXYCODONE HCL 10 MG PO TABS: 10.0000 mg | ORAL_TABLET | ORAL | 0 refills | Status: DC | PRN

## 2022-10-21 MED ORDER — DOCUSATE SODIUM 100 MG PO CAPS: 100.0000 mg | ORAL_CAPSULE | Freq: Every day | ORAL | 0 refills | Status: DC

## 2022-10-22 ENCOUNTER — Telehealth: Payer: Self-pay

## 2022-10-22 ENCOUNTER — Other Ambulatory Visit: Payer: Medicaid Other

## 2022-10-22 NOTE — Patient Instructions (Signed)
Visit Information  Thank you for taking time to visit with me today. Please don't hesitate to contact me if I can be of assistance to you before our next scheduled telephone appointment.  Our next appointment is by telephone on 10/29/22 at 1pm.  Following is a copy of your care plan:   Goals Addressed             This Visit's Progress    Patient Stated       1Current Barriers:  Knowledge Deficits related to plan of care for management of multiple co-morbidities including Stage IV Lung Cancer - currently receiving chemo, DM2, COPD, GERD, HTN, HLD, Depression, and CAD status post CABG 2023. Patient specifically needs help with "strength training" and regaining as much independence in caring for self as possible.   RNCM Clinical Goal(s):  Patient will verbalize basic understanding of  her Stage IV Lung Cancer disease process and self health management plan as evidenced by her understanding of goals of care in her Cancer treatment. As of 9/23 patient is receiving chemotherapy via her Left Chest mediport, next scheduled infusions are: 10/06/22.  As of 10/22/22 patient and her MD, Dr [name] have placed her chemo on hold partly due to an abscessed tooth that needed to be pulled this past week (worry re blood stream infection) and partly because patient feels her body physically can't handle the chemo at this time. Patient also being followed by telephonic Palliative Care program but is not considering Hospice at this time. take all medications exactly as prescribed and will call provider for medication related questions as evidenced by assertion of medication adherence and assertion of understanding that her providers are available to her for questions regarding any of her medications attend all scheduled medical appointments: Had multiple 10/06/22 appointmentswith Oncology  as evidenced by EMR documentation of completed appointments. Upcoming appts with Dr. Molli Hazard on 10/16, Dr. [name], Oncologist on  10/31, and palliative Care also on 10/31. continue to work with RN Care Manager to address care management and care coordination needs related to  her Stage IV Lung Cancer and multiple other co-morbidity diagnoses as evidenced by adherence to CM Team Scheduled appointments not experience hospital admission as evidenced by review of EMR. Hospital Admissions in last 6 months = 1x  through collaboration with RN Care manager, provider, and care team.   Interventions: Weekly collaboration x4 with Transition of Care RNCM to discuss ongoing progress in gaining physical strength and re-conditioning, preferably with HHS PT (TBA on 10/06/22 w/ MD), and to discuss ongoing goals of care in patient's Lung Cancer treatment plan. 10/11 Update - still has not heard back from [facility] re PT, provided patient with their ph# [phone] for follow up. Inter-disciplinary care team collaboration (see longitudinal plan of care) Evaluation of current treatment plan related to  self management and patient's adherence to plan as established by provider 10/11 Encouraged patient to call her PCP's office and speak to Triage Nurse or Provider as soon as we hung up to report increased swelling to her legs and "shiny" appearance to taut lower leg skin. Patient denied increased shortness of breath or increased dyspnea on exertion.   Oncology:  (Status: New goal.) Short Term Goal Assessment of understanding of oncology diagnosis:  Assessed patient understanding of cancer diagnosis and recommended treatment plan Reviewed upcoming provider appointments and treatment appointments Assessed available transportation to appointments and treatments. Has consistent/reliable transportation: Yes Assessed support system. Has consistent/reliable family or other support: Yes  Patient Goals/Self-Care  Activities: Take all medications as prescribed Attend all scheduled provider appointments Call pharmacy for medication refills 3-7 days in  advance of running out of medications Perform all self care activities independently  Call provider office for new concerns or questions   Follow Up Plan:  The patient has been provided with contact information for the care management team and has been advised to call with any health related questions or concerns. Next appointment with [facility] 10/18 at 1pm         Patient verbalizes understanding of instructions and care plan provided today and agrees to view in MyChart. Active MyChart status and patient understanding of how to access instructions and care plan via MyChart confirmed with patient.     The patient has been provided with contact information for the care management team and has been advised to call with any health related questions or concerns.   Please call the care guide team at [phone] if you need to cancel or reschedule your appointment.   Please call 1-800-273-TALK (toll free, 24 hour hotline) if you are experiencing a Mental Health or Behavioral Health Crisis or need someone to talk to.  Alyse Low, RN, BA, [facility], CRRN [facility] Coordinator, Transition of Care Ph # [phone]

## 2022-10-22 NOTE — Patient Outreach (Signed)
Care Management  Transitions of Care Program Managed Medicaid Transitions of Care week 2   10/22/2022 Name: Cassidy [name] MRN: [id number] DOB: 03-12-1965  Subjective: Cassidy [name] is a 57 y.o. year old female who is a primary care patient of [name], MD. The Care Management team Engaged with patient Engaged with patient by telephone to assess and address transitions of care needs.   Consent to Services:  Patient was given information about Managed Medicaid Care Management services, agreed to services, and gave verbal consent to participate.   Assessment:           SDOH Interventions    Flowsheet Row Clinical Support from 09/08/2021 in [facility]  SDOH Interventions   Financial Strain Interventions Financial Counselor        Goals Addressed             This Visit's Progress    Patient Stated       1Current Barriers:  Knowledge Deficits related to plan of care for management of multiple co-morbidities including Stage IV Lung Cancer - currently receiving chemo, DM2, COPD, GERD, HTN, HLD, Depression, and CAD status post CABG 2023. Patient specifically needs help with "strength training" and regaining as much independence in caring for self as possible.   RNCM Clinical Goal(s):  Patient will verbalize basic understanding of  her Stage IV Lung Cancer disease process and self health management plan as evidenced by her understanding of goals of care in her Cancer treatment. As of 9/23 patient is receiving chemotherapy via her Left Chest mediport, next scheduled infusions are: 10/06/22.  As of 10/22/22 patient and her MD, Dr [name] have placed her chemo on hold partly due to an abscessed tooth that needed to be pulled this past week (worry re blood stream infection) and partly because patient feels her body physically can't handle the chemo at this time. Patient also being followed by telephonic Palliative Care program but is not  considering Hospice at this time. take all medications exactly as prescribed and will call provider for medication related questions as evidenced by assertion of medication adherence and assertion of understanding that her providers are available to her for questions regarding any of her medications attend all scheduled medical appointments: Had multiple 10/06/22 appointmentswith Oncology  as evidenced by EMR documentation of completed appointments. Upcoming appts with Dr. Molli Hazard on 10/16, Dr. [name], Oncologist on 10/31, and palliative Care also on 10/31. continue to work with RN Care Manager to address care management and care coordination needs related to  her Stage IV Lung Cancer and multiple other co-morbidity diagnoses as evidenced by adherence to CM Team Scheduled appointments not experience hospital admission as evidenced by review of EMR. Hospital Admissions in last 6 months = 1x  through collaboration with RN Care manager, provider, and care team.   Interventions: Weekly collaboration x4 with Transition of Care RNCM to discuss ongoing progress in gaining physical strength and re-conditioning, preferably with [facility] PT (TBA on 10/06/22 w/ MD), and to discuss ongoing goals of care in patient's Lung Cancer treatment plan. 10/11 Update - still has not heard back from [facility] re PT, provided patient with their ph# [phone] for follow up. Inter-disciplinary care team collaboration (see longitudinal plan of care) Evaluation of current treatment plan related to  self management and patient's adherence to plan as established by provider 10/11 Encouraged patient to call her PCP's office and speak to Triage Nurse or Provider as soon as  we hung up to report increased swelling to her legs and "shiny" appearance to taut lower leg skin. Patient denied increased shortness of breath or increased dyspnea on exertion.   Oncology:  (Status: New goal.) Short Term Goal Assessment of understanding of oncology  diagnosis:  Assessed patient understanding of cancer diagnosis and recommended treatment plan Reviewed upcoming provider appointments and treatment appointments Assessed available transportation to appointments and treatments. Has consistent/reliable transportation: Yes Assessed support system. Has consistent/reliable family or other support: Yes  Patient Goals/Self-Care Activities: Take all medications as prescribed Attend all scheduled provider appointments Call pharmacy for medication refills 3-7 days in advance of running out of medications Perform all self care activities independently  Call provider office for new concerns or questions   Follow Up Plan:  The patient has been provided with contact information for the care management team and has been advised to call with any health related questions or concerns. Next appointment with [facility] 10/18 at 1pm         Plan: The patient has been provided with contact information for the care management team and has been advised to call with any health related questions or concerns.   Alyse Low, RN, BA, [facility], CRRN [facility] Coordinator, Transition of Care Ph # [phone]

## 2022-10-25 NOTE — Progress Notes (Deleted)
Cardiology Clinic Note   Patient Name: Cassidy [name] Date of Encounter: 10/25/2022  Primary Care Provider:  [name], MD Primary Cardiologist:  Reatha Harps, MD  Patient Profile    Cassidy [name] 57 year old female presents the clinic today for follow-up evaluation of her diastolic CHF.  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  s/p PCTA to dRCA and ostial RPAV-PLA vessel + PDA branch (05/2012)  b. [place] s/p DES to mLAD - Resolute DES 3.0 x 22 (3.100mm -->3.3 mm)   Bipolar depression (HCC)    Breast abscess    a. right side.    COPD (chronic obstructive pulmonary disease) (HCC)    Depression    Diabetic peripheral neuropathy (HCC)    Fibromyalgia    Genital warts    GERD (gastroesophageal reflux disease)    History of hiatal hernia    HLD (hyperlipidemia)    Hypertension    Lung cancer (HCC)    Pain in limb    a. LE VENOUS DUPLEX, 02/05/2009 - no evidence of deep vein thrombosis, Baker's cyst   Pneumonia    PTSD (post-traumatic stress disorder)    Sickle cell trait (HCC)    Tobacco abuse    Tooth caries    Type II diabetes mellitus (HCC)    Past Surgical History:  Procedure Laterality Date   BLADDER SURGERY  1980   "TVT"   BLADDER SURGERY  2003   "TVT"   BREAST EXCISIONAL BIOPSY     BREAST SURGERY Right    I&D for multiple abscesses   CARDIAC CATHETERIZATION     CORONARY ARTERY BYPASS GRAFT N/A 06/30/2021   Procedure: CORONARY ARTERY BYPASS GRAFTING (CABG) x3 USING LEFT INTERNAL MAMMARY ARTERY,  RIGHT RADIAL ARTERY AND LEFT GREATER SAPHENOUS VEIN;  Surgeon: Corliss Skains, MD;  Location: [facility];  Service: Open Heart Surgery;  Laterality: N/A;   cutting balloon     ENDOVEIN HARVEST OF GREATER SAPHENOUS VEIN Left 06/30/2021    Procedure: ENDOVEIN HARVEST OF GREATER SAPHENOUS VEIN;  Surgeon: Corliss Skains, MD;  Location: [facility];  Service: Open Heart Surgery;  Laterality: Left;   EYE SURGERY Left    laser surgery   FINE NEEDLE ASPIRATION  08/21/2021   Procedure: FINE NEEDLE ASPIRATION (FNA) LINEAR;  Surgeon: Josephine Igo, DO;  Location: [facility];  Service: Pulmonary;;   INCISE AND DRAIN ABCESS  ; 04/26/11; 08/13/11   right breast   INCISION AND DRAINAGE ABSCESS Right 05/09/2012   Procedure: INCISION AND DRAINAGE RIGHT BREAST ABSCESS;  Surgeon: Wilmon Arms. Corliss Skains, MD;  Location: [facility];  Service: General;  Laterality: Right;   INCISION AND DRAINAGE ABSCESS Right 02/08/2014   Procedure: INCISION AND DRAINAGE RIGHT BREAST ABSCESS;  Surgeon: Manus Rudd, MD;  Location: [facility];  Service: General;  Laterality: Right;   INCISION AND DRAINAGE ABSCESS Right 06/14/2014   Procedure: INCISION AND DRAINAGE RIGHT BREAST ABSCESS;  Surgeon: Manus Rudd, MD;  [facility]: [facility];  Service: General;  Laterality: Right;   IR IMAGING GUIDED PORT INSERTION  12/08/2021   IRRIGATION AND DEBRIDEMENT ABSCESS  12/19/2010   Procedure: IRRIGATION AND DEBRIDEMENT ABSCESS;  Surgeon: Emelia Loron, MD;  Location: [facility];  Service: General;  Laterality: Right;  IRRIGATION AND DEBRIDEMENT ABSCESS  08/13/2011   Procedure: MINOR INCISION AND DRAINAGE OF ABSCESS;  Surgeon: Cherylynn Ridges, MD;  [facility]: [facility];  Service: General;  Laterality: Right;  Right Breast    IRRIGATION AND DEBRIDEMENT ABSCESS  11/17/2011   Procedure: IRRIGATION AND DEBRIDEMENT ABSCESS;  Surgeon: Wilmon Arms. Corliss Skains, MD;  [facility]: [facility];  Service: General;  Laterality: Right;  irrigation and debridement right recurrent breast abscess   LARYNX SURGERY     LEFT HEART CATH AND CORONARY ANGIOGRAPHY N/A 04/09/2021   Procedure: LEFT HEART CATH AND CORONARY ANGIOGRAPHY;  Surgeon: Corky Crafts, MD;  Location: [facility] INVASIVE CV LAB;  Service: Cardiovascular;  Laterality: N/A;    LEFT HEART CATHETERIZATION WITH CORONARY ANGIOGRAM N/A 05/29/2012   Procedure: LEFT HEART CATHETERIZATION WITH CORONARY ANGIOGRAM & PTCA;  Surgeon: Lennette Bihari, MD;  Location: [facility] CATH LAB;  Service: Cardiovascular; Bifurcation dRCA-RPAD/PLA & rPDA  PTCA.   LEFT HEART CATHETERIZATION WITH CORONARY ANGIOGRAM N/A 11/08/2013   Procedure: LEFT HEART CATHETERIZATION WITH CORONARY ANGIOGRAM and Coronary Stent Intervention;  Surgeon: Marykay Lex, MD;  Location: [facility] CATH LAB;  Service: Cardiovascular; mLAD 80% (FFR 0.73) - resolute DES 3.0 x 22 mm (postdilated to 3.3 mm->3.5 mm)   NM MYOVIEW LTD  01/26/2009   Normal study, no evidence of ischemia, EF 67%   ployp removed from voice box 03/30/12     RADIAL ARTERY HARVEST Right 06/30/2021   Procedure: RADIAL ARTERY HARVEST;  Surgeon: Corliss Skains, MD;  [facility];  Service: Open Heart Surgery;  Laterality: Right;   REFRACTIVE SURGERY  ~ 2010   right   TEE WITHOUT CARDIOVERSION N/A 06/30/2021   Procedure: TRANSESOPHAGEAL ECHOCARDIOGRAM (TEE);  Surgeon: Corliss Skains, MD;  Location: [facility] OR;  Service: Open Heart Surgery;  Laterality: N/A;   TONSILLECTOMY  1988   VIDEO BRONCHOSCOPY WITH ENDOBRONCHIAL ULTRASOUND Bilateral 08/21/2021   Procedure: VIDEO BRONCHOSCOPY WITH ENDOBRONCHIAL ULTRASOUND;  Surgeon: Josephine Igo, DO;  Location: [facility];  Service: Pulmonary;  Laterality: Bilateral;    Allergies  Allergies  Allergen Reactions   Amoxil [Amoxicillin] Hives    Has patient had a PCN reaction causing immediate rash, facial/tongue/throat swelling, SOB or lightheadedness with hypotension: Yes Has patient had a PCN reaction causing severe rash involving mucus membranes or skin necrosis: No Has patient had a PCN reaction that required hospitalization No Has patient had a PCN reaction occurring within the last 10 years: Yes If all of the above answers are "NO", then may proceed with Cephalosporin use.   Chantix [Varenicline]  Other (See Comments)    Nightmares Hallucinations     Suboxone [Buprenorphine Hcl-Naloxone Hcl] Nausea And Vomiting   Emend [Fosaprepitant Dimeglumine] Itching    See notes for transfusion on 8/23. Pt reported new onset of itching after Emend infusion was started. 25mg  Benadryl IV given per MD. Pt tolerated remaining infusion well.     History of Present Illness    JAYLENA HOLLOWAY has a PMH of coronary artery disease status post CABG in 2023, hypertension, HLD, diabetes, stage IV lung cancer on chemotherapy, sickle cell trait, COPD.  She was admitted on 09/29/2022 and was discharged on 10/01/2022.  She was admitted for evaluation of NSTEMI.  She was receiving chemotherapy treatment and had recurrent episode of nausea vomiting and abdominal pain.  Her episode was accompanied by palpitations and nonexertional chest pain.  EKG showed acute ST/T wave changes.  Her cardiac troponins were mildly elevated and flat at 183,  118, 192, 173.  She underwent stress testing which showed no reversible ischemia and previous infarct.  Her EF was noted to be 67%.  No wall motion abnormalities were noted.  It was unsure whether her discomfort was related to undiagnosed sleep apnea or chemotherapy induced cardiomyopathy.  Her echocardiogram 09/29/2022 showed an LVEF of 60 to 65%, G1 DD, mild mitral valve regurgitation and no other significant valvular abnormalities.  She was discharged in stable condition on 10/01/2022.  She presented to the clinic 10/15/22 for follow-up evaluation and stated she had not had any further episodes of chest discomfort.  She reported that she was not taking her carvedilol correctly.  She had not taken it twice daily.  She reported that she had taken 2 days of Lasix for lower extremity swelling.  This had been prescribed by her PCP.  She had bilateral lower extremity +2 pitting edema to her knee.  I had her take another 5 days of furosemide 40 mg and potassium 20 mill equivalents.  I planned for  repeat BMP next week and follow-up in 1 to 2 weeks.  We reviewed the importance of low-salt diet, daily weights, no fluid restriction.   She presents to the clinic today for follow-up evaluation and states***.  Today she denies chest pain, shortness of breath, fatigue, palpitations, melena, hematuria, hemoptysis, diaphoresis, weakness, presyncope, syncope, orthopnea, and PND.  Bilateral lower extremity edema, diastolic dysfunction-+***euvolemic.  Following low-sodium diet and weighing daily. Continue furosemide as needed Repeat BMP  Heart healthy low-sodium diet Fluid restriction-50 ounces or less daily Daily weights-weight log-contact office with a weight increase of 2 to 3 pounds overnight or 5 pounds in 1 week.   NSTEMI-no chest pain today.  Recent Stress testing and echocardiogram.  Details above. Continue to monitor.  No plans for further ischemic evaluation at this time. Heart healthy low-sodium diet  Coronary artery disease-status post CABG 2023. Continue current medical therapy Heart healthy low-sodium diet Maintain physical activity as tolerated  Essential hypertension-BP today 92/6***0. Continue carvedilol Maintain blood pressure log  Hyperlipidemia-LDL 56 on 05/15/21. High-fiber diet Continue atorvastatin, aspirin  Stage IV lung cancer-being treated with chemotherapy Following with oncology  Disposition: Follow-up with Dr. Flora Lipps or me in 4 months.  Home Medications    Prior to Admission medications   Medication Sig Start Date End Date Taking? Authorizing Provider  aspirin EC 81 MG tablet Take 81 mg by mouth daily.    [provider]  atorvastatin (LIPITOR) 40 MG tablet TAKE 1 TABLET (40 MG TOTAL) BY MOUTH DAILY. 04/23/22   [name], DO  bisacodyl (DULCOLAX) 10 MG suppository Place 1 suppository (10 mg total) rectally daily as needed for severe constipation. Patient not taking: Reported on 10/04/2022 10/01/22   Marguerita Merles Latif, DO  busPIRone  (BUSPAR) 15 MG tablet Take 0.5 tablets (7.5 mg total) by mouth 2 (two) times daily. 05/20/22   [name], DO  Carboxymethylcellul-Glycerin (LUBRICATING EYE DROPS OP) Place 1 drop into both eyes daily as needed (dry eyes).    [provider]  carvedilol (COREG) 12.5 MG tablet Take 1 tablet (12.5 mg total) by mouth 2 (two) times daily with a meal. 10/01/22   Sheikh, Omair Latif, DO  diclofenac Sodium (VOLTAREN) 1 % GEL APPLY 1 APPLICATION TOPICALLY 3 TIMES DAILY AS NEEDED FOR PAIN 07/03/22   O'Neal, Ronnald Ramp, MD  docusate sodium (COLACE) 100 MG capsule Take 1 capsule (100 mg total) by mouth daily. 10/02/22   Marguerita Merles Latif, DO  DULoxetine (  CYMBALTA) 30 MG capsule Take 1 capsule (30 mg total) by mouth daily. 06/23/22   Antony Madura, MD  ferrous sulfate 325 (65 FE) MG tablet Take 325 mg by mouth daily with breakfast.    [provider]  fluticasone (FLONASE) 50 MCG/ACT nasal spray PLACE 1 SPRAY INTO BOTH NOSTRILS DAILY AS NEEDED (CONGESTION). 05/13/21   [name], DO  fluticasone-salmeterol (ADVAIR) 100-50 MCG/ACT AEPB Inhale 1 puff into the lungs 2 (two) times daily. 11/23/21   [name], DO  folic acid (FOLVITE) 1 MG tablet Take 1 tablet (1 mg total) by mouth daily. 03/31/22   Si Gaul, MD  furosemide (LASIX) 40 MG tablet Take 1 tablet (40 mg total) by mouth daily as needed. If you gain 3 lbs in 24 hours or 5 lbs in 1 week 07/17/21   Barrett, Erin R, PA-C  glucose blood (ACCU-CHEK AVIVA PLUS) test strip Use as instructed 02/02/22   [name], DO  hyoscyamine (LEVSIN SL) 0.125 MG SL tablet Place 1 tablet (0.125 mg total) under the tongue every 6 (six) hours as needed. For abdominal cramping 03/10/22   Heilingoetter, Cassandra L, PA-C  insulin glargine (LANTUS) 100 UNIT/ML Solostar Pen Inject 24 Units into the skin every morning. 05/03/22   [name], DO  Insulin Pen Needle 32G X 4 MM MISC 1 Needle by Does not apply route in the  morning and at bedtime. 02/02/22   [name], DO  ipratropium (ATROVENT HFA) 17 MCG/ACT inhaler Inhale 2 puffs into the lungs every 4 (four) hours as needed for wheezing (COPD).    [provider]  lactulose (CHRONULAC) 10 GM/15ML solution Take 30 mLs (20 g total) by mouth 2 (two) times daily as needed for mild constipation, moderate constipation or severe constipation. 03/10/22   Pickenpack-Cousar, Arty Baumgartner, NP  lidocaine-prilocaine (EMLA) cream APPLY 1 APPLICATION TOPICALLY AS NEEDED. 02/01/22   Heilingoetter, Cassandra L, PA-C  LORazepam (ATIVAN) 0.5 MG tablet Take 1 tablet (0.5 mg total) by mouth every 8 (eight) hours as needed for anxiety (nausea). 03/10/22   Pickenpack-Cousar, Arty Baumgartner, NP  megestrol (MEGACE) 20 MG tablet Take 1 tablet (20 mg total) by mouth 2 (two) times daily. 08/26/22   Pickenpack-Cousar, Arty Baumgartner, NP  nicotine polacrilex (NICORETTE) 2 MG gum Take 1 each (2 mg total) by mouth as needed for smoking cessation. 06/03/21   [name], DO  nitroGLYCERIN (NITROSTAT) 0.4 MG SL tablet Place 1 tablet (0.4 mg total) under the tongue every 5 (five) minutes as needed for chest pain. 04/24/20   [name], DO  ondansetron (ZOFRAN-ODT) 8 MG disintegrating tablet Take 1 tablet (8 mg total) by mouth every 8 (eight) hours as needed for nausea or vomiting. 01/28/22   Heilingoetter, Cassandra L, PA-C  oxyCODONE 10 MG TABS Take 1 tablet (10 mg total) by mouth every 4 (four) hours as needed for severe pain. 08/26/22   Pickenpack-Cousar, Arty Baumgartner, NP  oxyCODONE ER (XTAMPZA ER) 13.5 MG C12A Take 13.5 mg by mouth every 12 (twelve) hours. 09/14/22   Pickenpack-Cousar, Arty Baumgartner, NP  pantoprazole (PROTONIX) 40 MG tablet Take 1 tablet (40 mg total) by mouth 2 (two) times daily. 06/23/22   Zehr, Shanda Bumps D, PA-C  polyethylene glycol powder (GAVILAX) 17 GM/SCOOP powder TAKE ONE CAPFUL (17 GRAMS )BY MOUTH DAILY AS NEEDED FOR MILD OR MODERATE CONSTIPATION. Patient not taking: Reported  on 10/04/2022 04/13/22   [name], DO  potassium chloride SA (KLOR-CON M) 20 MEQ tablet Take 1 tablet (20 mEq  total) by mouth daily as needed. On days you take Lasix Patient not taking: Reported on 10/04/2022 07/17/21   Barrett, Rae Roam, PA-C  prochlorperazine (COMPAZINE) 10 MG tablet TAKE 1 TABLET (10 MG TOTAL) BY MOUTH EVERY 6 (SIX) HOURS AS NEEDED FOR NAUSEA OR VOMITING. Patient not taking: Reported on 10/04/2022 05/12/22   Heilingoetter, Cassandra L, PA-C  senna-docusate (SENOKOT-S) 8.6-50 MG tablet Take 1 tablet by mouth 2 (two) times daily. 10/01/22   Marguerita Merles Latif, DO  valACYclovir (VALTREX) 1000 MG tablet Take for 3 days prn each outbreak. Patient taking differently: Take 1,000 mg by mouth daily. Take for 3 days prn each outbreak. 07/08/22   Brock Bad, MD    Family History    Family History  Problem Relation Age of Onset   Esophageal cancer Mother    COPD Mother    Cancer Mother        lymphoma   Heart disease Mother    Stomach cancer Mother    Hyperlipidemia Father    Heart disease Father    Cancer Maternal Aunt        breast, colon   Crohn's disease Maternal Aunt    Hypertension Maternal Grandmother    Diabetes Maternal Grandmother    Hypertension Brother    Rectal cancer Neg Hx    Liver cancer Neg Hx    Colon cancer Neg Hx    Breast cancer Neg Hx    She indicated that her mother is deceased. She indicated that her father is deceased. She indicated that the status of her brother is unknown. She indicated that her maternal grandmother is deceased. She indicated that her maternal grandfather is deceased. She indicated that her paternal grandmother is deceased. She indicated that her paternal grandfather is deceased. She indicated that her maternal aunt is deceased. She indicated that the status of her neg hx is unknown.  Social History    Social History   Socioeconomic History   Marital status: Single    Spouse name: Not on file   Number of children: 1    Years of education: Not on file   Highest education level: Not on file  Occupational History   Occupation: homemaker   Occupation: disabled  Tobacco Use   Smoking status: Former    Current packs/day: 0.50    Average packs/day: 0.5 packs/day for 43.8 years (21.9 ttl pk-yrs)    Types: Cigarettes    Start date: 01/12/1979   Smokeless tobacco: Never   Tobacco comments:    PT STATES THAT SHE CHEWS THE GUM FOR SMOKING AND NO LONGER SMOKES  Vaping Use   Vaping status: Never Used  Substance and Sexual Activity   Alcohol use: No    Alcohol/week: 0.0 standard drinks of alcohol    Comment: recovering addict; 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   Social Determinants of Health   Financial Resource Strain: High Risk (09/08/2021)   Overall Financial Resource Strain (CARDIA)    Difficulty of Paying Living Expenses: Very hard  Food Insecurity: No Food Insecurity (10/22/2022)   Hunger Vital Sign    Worried About Running Out of Food in the Last Year: Never true    Ran Out of Food in the Last Year: Never true  Transportation Needs: No Transportation Needs (10/22/2022)   PRAPARE - Administrator, Civil Service (Medical): No    Lack of Transportation (Non-Medical): No  Physical Activity: Not on file  Stress: Not on file  Social Connections: Not on file  Intimate Partner Violence: Not At Risk (10/22/2022)   Humiliation, Afraid, Rape, and Kick questionnaire    Fear of Current or Ex-Partner: No    Emotionally Abused: No    Physically  Abused: No    Sexually Abused: No     Review of Systems    General:  No chills, fever, night sweats or weight changes.  Cardiovascular:  No chest pain, dyspnea on exertion, edema, orthopnea, palpitations, paroxysmal nocturnal dyspnea. Dermatological: No rash, lesions/masses Respiratory: No cough, dyspnea Urologic: No hematuria, dysuria Abdominal:   No nausea, vomiting, diarrhea, bright red blood per rectum, melena, or hematemesis Neurologic:  No visual changes, wkns, changes in mental status. All other systems reviewed and are otherwise negative except as noted above.  Physical Exam    VS:  LMP 06/12/2016 (Approximate)  , BMI There is no height or weight on file to calculate BMI. GEN: Well nourished, well developed, in no acute distress. HEENT: normal. Neck: Supple, no JVD, carotid bruits, or masses. Cardiac: RRR, no murmurs, rubs, or gallops. No clubbing, cyanosis, +2 pitt***ing bilateral lower extremity to knee edema.  Radials/DP/PT 2+ and equal bilaterally.  Respiratory:  Respirations regular and unlabored, clear to auscultation bilaterally. GI: Soft, nontender, nondistended, BS + x 4. MS: no deformity or atrophy. Skin: warm and dry, no rash. Neuro:  Strength and sensation are intact. Psych: Normal affect.  Accessory Clinical Findings    Recent Labs: 08/25/2022: TSH 8.471 09/29/2022: B Natriuretic Peptide 112.4 10/01/2022: Magnesium 1.7 10/06/2022: ALT 13; BUN 16; Creatinine 1.25; Hemoglobin 7.7; Platelet Count 316; Potassium 3.8; Sodium 135   Recent Lipid Panel    Component Value Date/Time   CHOL 131 05/15/2021 1615   CHOL 132 08/07/2012 0905   TRIG 77 05/15/2021 1615   TRIG 73 08/07/2012 0905   HDL 60 05/15/2021 1615   HDL 42 08/07/2012 0905   CHOLHDL 2.2 05/15/2021 1615   CHOLHDL 4 11/29/2014 1140   VLDL 23.8 11/29/2014 1140   LDLCALC 56 05/15/2021 1615   LDLCALC 75 08/07/2012 0905    No BP recorded.  {Refresh Note OR Click here to enter BP  :1}***    ECG  personally reviewed by me today-none today***.  Nuclear stress test 09/29/2022  IMPRESSION: 1. No reversible ischemia. Fixed defect in the inferolateral wall suggest prior infarction.   2. Normal left ventricular wall motion.   3. Left ventricular ejection fraction 67%   4. Non invasive risk stratification*: Low   *2012 Appropriate Use Criteria for Coronary Revascularization Focused Update: J Am Coll Cardiol. 2012;59(9):857-881. http://content.dementiazones.com.aspx?articleid=1201161   Echocardiogram 09/29/2022 1. Left ventricular ejection fraction, by estimation, is 60 to 65%. The  left ventricle has normal function. The left ventricle has no regional  wall motion abnormalities. There is mild concentric left ventricular  hypertrophy. Left ventricular diastolic  parameters are consistent with Grade I diastolic dysfunction (impaired  relaxation).   2. Right ventricular systolic function is moderately reduced. The right  ventricular size is not well visualized.   3. There  is prominent systolic anterior motion of the mitral valve, but  there is no LV outflow obstruction (Valsalva maneuver was not performed).  The mitral valve is normal in structure. Mild mitral valve regurgitation.  No evidence of mitral stenosis.   4. The aortic valve is normal in structure. Aortic valve regurgitation is  not visualized. No aortic stenosis is present.   5. The inferior vena cava is normal in size with greater than 50%  respiratory variability, suggesting right atrial pressure of 3 mmHg.       Assessment & Plan   1. ***   Thomasene Ripple. Jase Reep NP-C     10/25/2022, 8:25 AM [facility] [address] Office [phone] Fax [phone]    I spent 1***4 minutes examining this patient, reviewing medications, and using patient centered shared decision making involving her cardiac care.  Prior to her visit I spent greater than 20 minutes reviewing  her past medical history,  medications, and prior cardiac tests.

## 2022-10-27 ENCOUNTER — Ambulatory Visit: Payer: Medicaid Other

## 2022-10-27 ENCOUNTER — Ambulatory Visit: Payer: Medicaid Other | Admitting: Internal Medicine

## 2022-10-27 ENCOUNTER — Other Ambulatory Visit: Payer: Medicaid Other

## 2022-10-27 ENCOUNTER — Ambulatory Visit: Payer: Medicaid Other | Attending: General Practice | Admitting: General Practice

## 2022-10-28 ENCOUNTER — Encounter: Payer: Self-pay | Admitting: General Practice

## 2022-10-29 ENCOUNTER — Other Ambulatory Visit: Payer: Self-pay

## 2022-10-29 ENCOUNTER — Emergency Department (HOSPITAL_BASED_OUTPATIENT_CLINIC_OR_DEPARTMENT_OTHER)
Admission: EM | Admit: 2022-10-29 | Discharge: 2022-10-29 | Disposition: A | Discharge status: Home / Self Care | Payer: Medicaid Other | Attending: Emergency Medicine | Admitting: Emergency Medicine

## 2022-10-29 ENCOUNTER — Telehealth: Payer: Self-pay

## 2022-10-29 ENCOUNTER — Encounter (HOSPITAL_BASED_OUTPATIENT_CLINIC_OR_DEPARTMENT_OTHER): Payer: Self-pay | Admitting: Emergency Medicine

## 2022-10-29 ENCOUNTER — Emergency Department (HOSPITAL_BASED_OUTPATIENT_CLINIC_OR_DEPARTMENT_OTHER): Payer: Medicaid Other | Admitting: Radiology

## 2022-10-29 ENCOUNTER — Emergency Department (HOSPITAL_BASED_OUTPATIENT_CLINIC_OR_DEPARTMENT_OTHER): Payer: Medicaid Other

## 2022-10-29 DIAGNOSIS — M7989 Other specified soft tissue disorders: Secondary | ICD-10-CM | POA: Diagnosis not present

## 2022-10-29 DIAGNOSIS — J449 Chronic obstructive pulmonary disease, unspecified: Secondary | ICD-10-CM | POA: Diagnosis not present

## 2022-10-29 DIAGNOSIS — J168 Pneumonia due to other specified infectious organisms: Secondary | ICD-10-CM | POA: Diagnosis not present

## 2022-10-29 DIAGNOSIS — Z7951 Long term (current) use of inhaled steroids: Secondary | ICD-10-CM | POA: Diagnosis not present

## 2022-10-29 DIAGNOSIS — R0789 Other chest pain: Secondary | ICD-10-CM | POA: Diagnosis not present

## 2022-10-29 DIAGNOSIS — I2510 Atherosclerotic heart disease of native coronary artery without angina pectoris: Secondary | ICD-10-CM | POA: Diagnosis not present

## 2022-10-29 DIAGNOSIS — Z951 Presence of aortocoronary bypass graft: Secondary | ICD-10-CM | POA: Insufficient documentation

## 2022-10-29 DIAGNOSIS — R600 Localized edema: Secondary | ICD-10-CM

## 2022-10-29 DIAGNOSIS — Z7982 Long term (current) use of aspirin: Secondary | ICD-10-CM | POA: Insufficient documentation

## 2022-10-29 DIAGNOSIS — R0602 Shortness of breath: Secondary | ICD-10-CM | POA: Insufficient documentation

## 2022-10-29 DIAGNOSIS — J189 Pneumonia, unspecified organism: Secondary | ICD-10-CM

## 2022-10-29 LAB — CBC WITH DIFFERENTIAL/PLATELET
Abs Immature Granulocytes: 0.04 10*3/uL (ref 0.00–0.07)
Basophils Absolute: 0 10*3/uL (ref 0.0–0.1)
Basophils Relative: 0 %
Eosinophils Absolute: 0.1 10*3/uL (ref 0.0–0.5)
Eosinophils Relative: 1 %
HCT: 32.3 % — ABNORMAL LOW (ref 36.0–46.0)
Hemoglobin: 11.1 g/dL — ABNORMAL LOW (ref 12.0–15.0)
Immature Granulocytes: 1 %
Lymphocytes Relative: 17 %
Lymphs Abs: 1.1 10*3/uL (ref 0.7–4.0)
MCH: 33.2 pg (ref 26.0–34.0)
MCHC: 34.4 g/dL (ref 30.0–36.0)
MCV: 96.7 fL (ref 80.0–100.0)
Monocytes Absolute: 0.8 10*3/uL (ref 0.1–1.0)
Monocytes Relative: 13 %
Neutro Abs: 4.3 10*3/uL (ref 1.7–7.7)
Neutrophils Relative %: 68 %
Platelets: 194 10*3/uL (ref 150–400)
RBC: 3.34 MIL/uL — ABNORMAL LOW (ref 3.87–5.11)
RDW: 18.6 % — ABNORMAL HIGH (ref 11.5–15.5)
WBC: 6.3 10*3/uL (ref 4.0–10.5)
nRBC: 0 % (ref 0.0–0.2)

## 2022-10-29 LAB — BRAIN NATRIURETIC PEPTIDE: B Natriuretic Peptide: 21.1 pg/mL (ref 0.0–100.0)

## 2022-10-29 LAB — HEPATIC FUNCTION PANEL
ALT: 6 U/L (ref 0–44)
AST: 18 U/L (ref 15–41)
Albumin: 3.1 g/dL — ABNORMAL LOW (ref 3.5–5.0)
Alkaline Phosphatase: 71 U/L (ref 38–126)
Bilirubin, Direct: <0.1 mg/dL (ref 0.0–0.2)
Total Bilirubin: 0.6 mg/dL (ref 0.3–1.2)
Total Protein: 6.9 g/dL (ref 6.5–8.1)

## 2022-10-29 LAB — BASIC METABOLIC PANEL
Anion gap: 7 (ref 5–15)
BUN: 12 mg/dL (ref 6–20)
CO2: 22 mmol/L (ref 22–32)
Calcium: 8.7 mg/dL — ABNORMAL LOW (ref 8.9–10.3)
Chloride: 106 mmol/L (ref 98–111)
Creatinine, Ser: 0.93 mg/dL (ref 0.44–1.00)
GFR, Estimated: >60 mL/min (ref >60)
Glucose, Bld: 94 mg/dL (ref 70–99)
Potassium: 3.4 mmol/L — ABNORMAL LOW (ref 3.5–5.1)
Sodium: 135 mmol/L (ref 135–145)

## 2022-10-29 MED ORDER — ACETAMINOPHEN 325 MG PO TABS
650.0000 mg | ORAL_TABLET | Freq: Once | ORAL | Status: AC
  Administered 2022-10-29: 650 mg via ORAL
  Filled 2022-10-29: qty 2

## 2022-10-29 MED ORDER — LEVOFLOXACIN 750 MG PO TABS: 750.0000 mg | ORAL_TABLET | Freq: Every day | ORAL | 0 refills | Status: DC

## 2022-10-29 NOTE — ED Notes (Signed)
Fall risk armband Fall risk socks Fall risk sign on door

## 2022-10-29 NOTE — Patient Outreach (Signed)
Care Management  Transitions of Care Program Managed Medicaid Transitions of Care follow-up call checking on patient's health status after recommending same-day evaluation by MD /Urgent Care    10/29/2022 Name: Cassidy [name] MRN: [id number] DOB: 1965-01-20  Subjective: Cassidy [name] is a 57 y.o. year old female who is a primary care patient of [name], MD. The Care Management team Engaged with patient Engaged with patient by telephone to assess and address transitions of care needs.   Consent to Services:  Patient was given information about Managed Medicaid Care Management services, agreed to services, and gave verbal consent to participate.   Assessment:   See earlier completed tele-visit for 10/29/22 - this call was same-day follow-up to check on patient's status after patient committed to going to see an Urgent Care provider re symptoms of fluid overload.  The patient is presently at [facility] ED awaiting MD assessment of complaints voiced earlier during our televisit today.    Goals Addressed   None     Plan: The patient has been provided with contact information for the care management team and has been advised to call with any health related questions or concerns.   Alyse Low, RN, BA, [facility], CRRN [facility] Coordinator, Transition of Care Ph # [phone]

## 2022-10-29 NOTE — ED Provider Notes (Signed)
[place] EMERGENCY DEPARTMENT AT [facility] Provider Note   CSN: [id number] Arrival date & time: 10/29/22  1602     History  Chief Complaint  Patient presents with   Leg Swelling    Cassidy [name] is a 57 y.o. female past medical history of coronary artery disease, hyperlipidemia, substance abuse, GERD, COPD, stage IV adenocarcinoma of the lung, status post CABG, heart failure presenting with bilateral lower extremity edema this been ongoing for 2 months.  Patient reports that she feels the edema is getting worse and is causing some discomfort as well.  Patient has tried taking Lasix at home and elevation. Reports mild shortness of breath once while walking. No increase SOB with laying down. Denies any cough, chest pain, abdominal pain.  HPI     Home Medications Prior to Admission medications   Medication Sig Start Date End Date Taking? Authorizing Provider  aspirin EC 81 MG tablet Take 81 mg by mouth daily.    [provider]  atorvastatin (LIPITOR) 40 MG tablet TAKE 1 TABLET (40 MG TOTAL) BY MOUTH DAILY. 04/23/22   [name], DO  bisacodyl (DULCOLAX) 10 MG suppository Place 1 suppository (10 mg total) rectally daily as needed for severe constipation. 10/01/22   Sheikh, Omair Latif, DO  busPIRone (BUSPAR) 15 MG tablet Take 0.5 tablets (7.5 mg total) by mouth 2 (two) times daily. 05/20/22   [name], DO  Carboxymethylcellul-Glycerin (LUBRICATING EYE DROPS OP) Place 1 drop into both eyes daily as needed (dry eyes).    [provider]  carvedilol (COREG) 12.5 MG tablet Take 1 tablet (12.5 mg total) by mouth 2 (two) times daily with a meal. 10/01/22   Sheikh, Omair Latif, DO  diclofenac Sodium (VOLTAREN) 1 % GEL APPLY 1 APPLICATION TOPICALLY 3 TIMES DAILY AS NEEDED FOR PAIN 07/03/22   O'Neal, Ronnald Ramp, MD  docusate sodium (COLACE) 100 MG capsule Take 1 capsule (100 mg total) by mouth daily. 10/21/22   Pickenpack-Cousar, Arty Baumgartner, NP   DULoxetine (CYMBALTA) 30 MG capsule Take 1 capsule (30 mg total) by mouth daily. 06/23/22   Antony Madura, MD  ferrous sulfate 325 (65 FE) MG tablet Take 325 mg by mouth daily with breakfast.    [provider]  fluticasone (FLONASE) 50 MCG/ACT nasal spray PLACE 1 SPRAY INTO BOTH NOSTRILS DAILY AS NEEDED (CONGESTION). 05/13/21   [name], DO  fluticasone-salmeterol (ADVAIR) 100-50 MCG/ACT AEPB Inhale 1 puff into the lungs 2 (two) times daily. 11/23/21   [name], DO  folic acid (FOLVITE) 1 MG tablet Take 1 tablet (1 mg total) by mouth daily. 03/31/22   Si Gaul, MD  furosemide (LASIX) 40 MG tablet Take 1 tablet (40 mg total) by mouth daily as needed. If you gain 3 lbs in 24 hours or 5 lbs in 1 week 07/17/21   Story Vanvranken, Erin R, PA-C  glucose blood (ACCU-CHEK AVIVA PLUS) test strip Use as instructed 02/02/22   [name], DO  hyoscyamine (LEVSIN SL) 0.125 MG SL tablet Place 1 tablet (0.125 mg total) under the tongue every 6 (six) hours as needed. For abdominal cramping 03/10/22   Heilingoetter, Cassandra L, PA-C  insulin glargine (LANTUS) 100 UNIT/ML Solostar Pen Inject 24 Units into the skin every morning. 05/03/22   [name], DO  Insulin Pen Needle 32G X 4 MM MISC 1 Needle by Does not apply route in the morning and at bedtime. 02/02/22   [name], DO  ipratropium (ATROVENT HFA) 17 MCG/ACT inhaler Inhale  2 puffs into the lungs every 4 (four) hours as needed for wheezing (COPD).    [provider]  lactulose (CHRONULAC) 10 GM/15ML solution Take 30 mLs (20 g total) by mouth 2 (two) times daily as needed for mild constipation, moderate constipation or severe constipation. 03/10/22   Pickenpack-Cousar, Arty Baumgartner, NP  lidocaine-prilocaine (EMLA) cream APPLY 1 APPLICATION TOPICALLY AS NEEDED. 02/01/22   Heilingoetter, Cassandra L, PA-C  LORazepam (ATIVAN) 0.5 MG tablet Take 1 tablet (0.5 mg total) by mouth every 8 (eight) hours as needed for  anxiety (nausea). 03/10/22   Pickenpack-Cousar, Arty Baumgartner, NP  megestrol (MEGACE) 20 MG tablet Take 1 tablet (20 mg total) by mouth 2 (two) times daily. 08/26/22   Pickenpack-Cousar, Arty Baumgartner, NP  metoCLOPramide (REGLAN) 10 MG tablet Take 1 tablet (10 mg total) by mouth every 8 (eight) hours as needed for nausea. 10/21/22   Pickenpack-Cousar, Arty Baumgartner, NP  nicotine polacrilex (NICORETTE) 2 MG gum Take 1 each (2 mg total) by mouth as needed for smoking cessation. 06/03/21   [name], DO  nitroGLYCERIN (NITROSTAT) 0.4 MG SL tablet Place 1 tablet (0.4 mg total) under the tongue every 5 (five) minutes as needed for chest pain. 04/24/20   [name], DO  ondansetron (ZOFRAN-ODT) 8 MG disintegrating tablet Take 1 tablet (8 mg total) by mouth every 8 (eight) hours as needed for nausea or vomiting. 01/28/22   Heilingoetter, Cassandra L, PA-C  oxyCODONE ER (XTAMPZA ER) 13.5 MG C12A Take 13.5 mg by mouth every 12 (twelve) hours. 10/21/22   Pickenpack-Cousar, Arty Baumgartner, NP  Oxycodone HCl 10 MG TABS Take 1 tablet (10 mg total) by mouth every 4 (four) hours as needed. 10/21/22   Pickenpack-Cousar, Arty Baumgartner, NP  pantoprazole (PROTONIX) 40 MG tablet Take 1 tablet (40 mg total) by mouth 2 (two) times daily. 06/23/22   Zehr, Shanda Bumps D, PA-C  polyethylene glycol powder (GAVILAX) 17 GM/SCOOP powder TAKE ONE CAPFUL (17 GRAMS )BY MOUTH DAILY AS NEEDED FOR MILD OR MODERATE CONSTIPATION. 04/13/22   [name], DO  potassium chloride SA (KLOR-CON M) 20 MEQ tablet Take 1 tablet (20 mEq total) by mouth daily as needed. On days you take Lasix 07/17/21   Lovette Merta, Erin R, PA-C  prochlorperazine (COMPAZINE) 10 MG tablet TAKE 1 TABLET (10 MG TOTAL) BY MOUTH EVERY 6 (SIX) HOURS AS NEEDED FOR NAUSEA OR VOMITING. 05/12/22   Heilingoetter, Cassandra L, PA-C  senna-docusate (SENOKOT-S) 8.6-50 MG tablet Take 1 tablet by mouth 2 (two) times daily. 10/01/22   Marguerita Merles Latif, DO  valACYclovir (VALTREX) 1000 MG tablet Take  for 3 days prn each outbreak. Patient taking differently: Take 1,000 mg by mouth daily. Take for 3 days prn each outbreak. 07/08/22   Brock Bad, MD      Allergies    Amoxil [amoxicillin], Chantix [varenicline], Suboxone [buprenorphine hcl-naloxone hcl], and Emend [fosaprepitant dimeglumine]    Review of Systems   Review of Systems  Physical Exam Updated Vital Signs BP 109/72 (BP Location: Right Arm)   Pulse 86   Temp 97.8 F (36.6 C)   Resp 18   LMP 06/12/2016 (Approximate)   SpO2 100%  Physical Exam Vitals and nursing note reviewed.  Constitutional:      General: She is not in acute distress.    Appearance: She is not toxic-appearing.  HENT:     Head: Normocephalic and atraumatic.  Eyes:     General: No scleral icterus.    Conjunctiva/sclera: Conjunctivae normal.  Cardiovascular:  Rate and Rhythm: Normal rate and regular rhythm.     Pulses: Normal pulses.     Heart sounds: Normal heart sounds.  Pulmonary:     Effort: Pulmonary effort is normal. No respiratory distress.     Breath sounds: Normal breath sounds. No wheezing.  Abdominal:     General: Abdomen is flat. Bowel sounds are normal.     Palpations: Abdomen is soft.     Tenderness: There is no abdominal tenderness.  Musculoskeletal:        General: Tenderness present.     Right lower leg: Edema present.     Left lower leg: Edema present.     Comments: Patient has generalized tenderness from ankles to lower knee, no obvious cellulitis, strong dorsal pedal pulse.   Skin:    General: Skin is warm and dry.     Findings: No lesion.  Neurological:     General: No focal deficit present.     Mental Status: She is alert and oriented to person, place, and time. Mental status is at baseline.     ED Results / Procedures / Treatments   Labs (all labs ordered are listed, but only abnormal results are displayed) Labs Reviewed  CBC WITH DIFFERENTIAL/PLATELET - Abnormal; Notable for the following components:       Result Value   RBC 3.34 (*)    Hemoglobin 11.1 (*)    HCT 32.3 (*)    RDW 18.6 (*)    All other components within normal limits  BASIC METABOLIC PANEL - Abnormal; Notable for the following components:   Potassium 3.4 (*)    Calcium 8.7 (*)    All other components within normal limits    EKG None  Radiology No results found.  Procedures Procedures    Medications Ordered in ED Medications - No data to display  ED Course/ Medical Decision Making/ A&P Clinical Course as of 10/29/22 2006  Fri Oct 29, 2022  1955 SOB, leg swelling [JD]    Clinical Course User Index [JD] Laurence Spates, MD                                 Medical Decision Making Amount and/or Complexity of Data Reviewed Labs: ordered. Radiology: ordered.  Risk OTC drugs. Prescription drug management.   [name] 57 y.o. presented today for shortness of breath and BLE edema.  Working DDx that I considered at this time includes, but not limited to, asthma/COPD exacerbation, URI, viral illness, anemia, ACS, PE, pneumonia, pleural effusion, lung mass.  R/o DDx: These are considered less likely due to history of present illness, physical exam, labs/imaging findings  Pmhx: coronary artery disease, hyperlipidemia, substance abuse, GERD, COPD, stage IV adenocarcinoma of the lung, status post CABG, heart failure  Review of prior external notes: None  Unique Tests and My Interpretation:  CBC: No elevated white blood cell count, hemoglobin 11.1 BMP: Unremarkable, past potassium 3.4 BNP 21.1 Paddock function shows no elevation in liver enzymes or bilirubin  CXR: Mild left basilar opacity  Ultrasound of lower extremity bilaterally shows no blood clot     Problem List / ED Course / Critical interventions / Medication management  Patient reports very mild shortness of breath.  Does not have any cough or chest pain.  Patient reports that shortness of breath happen 1 time with walking and is  typical of her baseline.  Obtain chest x-ray which did  not show any fluid overload however does suggest possible developing pneumonia.  I sent patient home with Levaquin.  Patient felt comfortable, hemodynamically stable not requiring any oxygen.  Patient reports her shortness of breath has resolved. Ordered bilateral lower extremity ultrasound to rule out blood clot given patient does not feel edema is getting better with Lasix.  Ultrasound are negative for blood clot.  Discussed elevation and compression for swelling as well as continuing Lasix and following up with cardiology.  Patient feels comfortable with this plan and is able to ambulate with steady gait.  Patient ambulated to the bathroom during stay.  I ordered medication including tylenol for pain  Reevaluation of the patient after these medicines showed that the patient improved Patients vitals assessed. Upon arrival patient is hemodynamically stable.  I have reviewed the patients home medicines and have made adjustments as needed    Plan: Follow-up with cardiology for continued bilateral lower extremity edema F/u w/ PCP in 2-3d to ensure resolution of sx, sent Levaquin for possible developing pneumonia on chest x-ray. Patient was given return precautions. Patient stable for discharge at this time.  Patient educated on sx/dx and verbalized understanding of plan. Will return to ER for new or worsening sx.          Final Clinical Impression(s) / ED Diagnoses Final diagnoses:  None    Rx / DC Orders ED Discharge Orders     None         Smitty Knudsen, PA-C 10/30/22 0002    Laurence Spates, MD 10/30/22 [phone]

## 2022-10-29 NOTE — Patient Outreach (Signed)
Care Management  Transitions of Care Program Transitions of Care Post-discharge week 3   10/29/2022 Name: Cassidy [name] MRN: [id number] DOB: Mar 02, 1965  Subjective: Cassidy [name] is a 57 y.o. year old female who is a primary care patient of [name], MD. The Care Management team Engaged with patient Engaged with patient by telephone to assess and address transitions of care needs.   Consent to Services:  Patient was given information about Managed Medicaid Care Management services, agreed to services, and gave verbal consent to participate.   Assessment:           SDOH Interventions    Flowsheet Row Clinical Support from 09/08/2021 in [facility] at [facility]  SDOH Interventions   Financial Strain Interventions Financial Counselor        Goals Addressed             This Visit's Progress    Patient Stated       1Current Barriers:  Knowledge Deficits related to plan of care for management of multiple co-morbidities including Stage IV Lung Cancer - currently receiving chemo, DM2, COPD, GERD, HTN, HLD, Depression, and CAD status post CABG 2023. Patient specifically needs help with "strength training" and regaining as much independence in caring for self as possible.   RNCM Clinical Goal(s):  Patient will verbalize basic understanding of  her Stage IV Lung Cancer disease process and self health management plan as evidenced by her understanding of goals of care in her Cancer treatment. As of 9/23 patient is receiving chemotherapy via her Left Chest mediport, next scheduled infusions are: 10/06/22.  As of 10/22/22 patient and her MD, Dr [name] have placed her chemo on hold partly due to an abscessed tooth that needed to be pulled this past week (worry re blood stream infection) and partly because patient feels her body physically can't handle the chemo at this time. Patient also being followed by telephonic Palliative Care program but is not  considering Hospice at this time. 10/18 Televisit with RNCM revealed patient needs to follow up with Cardiologist or [facility] ED regarding symptoms of fluid overload that pt described to nurse (see below) take all medications exactly as prescribed and will call provider for medication related questions as evidenced by assertion of medication adherence and assertion of understanding that her providers are available to her for questions regarding any of her medications attend all scheduled medical appointments: Had multiple 10/06/22 appointmentswith Oncology  as evidenced by EMR documentation of completed appointments. Upcoming appts with Dr. Molli Hazard on 10/16, Dr. [name], Oncologist on 10/31, and palliative Care also on 10/31. continue to work with RN Care Manager to address care management and care coordination needs related to  her Stage IV Lung Cancer and multiple other co-morbidity diagnoses as evidenced by adherence to CM Team Scheduled appointments not experience hospital admission as evidenced by review of EMR. Hospital Admissions in last 6 months = 1x  through collaboration with RN Care manager, provider, and care team.   Interventions: Weekly collaboration x4 with Transition of Care RNCM to discuss ongoing progress in gaining physical strength and re-conditioning, preferably with [facility] PT (TBA on 10/06/22 w/ MD), and to discuss ongoing goals of care in patient's Lung Cancer treatment plan. 10/11 Update - still has not heard back from [facility] re PT, provided patient with their ph# [phone] for follow up. Inter-disciplinary care team collaboration (see longitudinal plan of care) Evaluation of current treatment plan related to  self management  and patient's adherence to plan as established by provider 10/11 Encouraged patient to call her PCP's office and speak to Triage Nurse or Provider as soon as we hung up to report increased swelling to her legs and "shiny" appearance to taut lower leg skin.  Patient denied increased shortness of breath or increased dyspnea on exertion. 10/18 Patient did not follow up with PCP (states she has new PCP that does not know her yet & still need to establish care with her previous PCP's replacement) nor did patient see Cardiology as scheduled as she felt too weak, immobile, and sick to go to appointment. Urged patient to either call Cardiologist or go to [facility] ED to have c/o increased swelling above knees, shiny, taut skin, burning sensation to feet - feeling like open wounds present, increased dyspnea and fatigue when up and moving around, and inability to ambulate without walker around the house, and painfully & "not well" even with walker  Oncology:  (Status: ONgoing) Short Term Goal Assessment of understanding of oncology diagnosis:  Assessed patient understanding of cancer diagnosis and recommended treatment plan Reviewed upcoming provider appointments and treatment appointments Assessed available transportation to appointments and treatments. Has consistent/reliable transportation: Yes Assessed support system. Has consistent/reliable family or other support: Yes  Patient Goals/Self-Care Activities: Take all medications as prescribed Attend all scheduled provider appointments Call pharmacy for medication refills 3-7 days in advance of running out of medications Perform all self care activities independently  Call provider office for new concerns or questions   Follow Up Plan:  The patient has been provided with contact information for the care management team and has been advised to call with any health related questions or concerns. Next appointment with [facility] 10/27 at 1pm HOWEVER this RNCM will follow up with patient before 5pm today, 10/29/22 to check on patient's status and to see whether she sought medical help today.         Plan: The patient has been provided with contact information for the care management team and has been  advised to call with any health related questions or concerns.   Alyse Low, RN, BA, [facility], CRRN [facility] Coordinator, Transition of Care Ph # [phone]

## 2022-10-29 NOTE — ED Provider Notes (Signed)
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  For many months she has had bilateral lower extremity edema.  She is on Lasix and feels like it is only getting worse.  Her nurse today noted that she was having worsening pain in her legs and folic she is on difficulty walking due to edema so told her to go to the ED.  She has some pain in her posterior calves.  Swelling is about to her knee bilaterally.  No swelling to the thighs, abdomen.  She has benign abdominal exam.  No signs of cellulitis, abscess.  Distal pulses are intact.  She is some occasional shortness of breath, but is not hypoxic and no DOE.  Will add on BNP given history of heart failure as well as chest x-ray.  She had a DVT also about a month ago which was unremarkable and bilateral DVT is less likely, however persistence of her symptoms and worsening will repeat ultrasound study here.  DVT study without evidence of DVT.  Her BNP here is normal, her chest x-ray shows small left pleural effusion and small left lower lobe opacity.  She has no leukocytosis, fever, cough but does report some shortness of breath for last week or so.  It is very mild I have low suspicion for PE but I am concerned this could be an early pneumonia and she does have significant medical history.  I suspect her lower extremity edema is chronic, probably some component of chronic heart failure does not appear to be acutely exacerbated.  She has no blood clots and is neurovascularly intact.  Could be some degree of third spacing from low albumin as well and her systemic illness.  I recommended she follow close with her primary care doctor and other specialist.  Will treat with course of Levaquin due to concern for opacity on x-ray.  I recommend she follow-up closely for her pleural effusion as well.  She was comfortable this plan,  ambulating in the ED, not hypoxic.  Discharged in stable condition.   Laurence Spates, MD 10/30/22 0110

## 2022-10-29 NOTE — Patient Instructions (Signed)
Visit Information  Thank you for taking time to visit with me today. Please don't hesitate to contact me if I can be of assistance to you before our next scheduled telephone appointment. I will call you later today to check and see if you were able to be seen for evaluation of potential fluid overload by either your Cardiologist or @ [facility] ED [facility].  Our next regularly scheduled appointment is by telephone on 11/05/22 at 1pm  Following is a copy of your care plan:   Goals Addressed             This Visit's Progress    Patient Stated       1Current Barriers:  Knowledge Deficits related to plan of care for management of multiple co-morbidities including Stage IV Lung Cancer - currently receiving chemo, DM2, COPD, GERD, HTN, HLD, Depression, and CAD status post CABG 2023. Patient specifically needs help with "strength training" and regaining as much independence in caring for self as possible.   RNCM Clinical Goal(s):  Patient will verbalize basic understanding of  her Stage IV Lung Cancer disease process and self health management plan as evidenced by her understanding of goals of care in her Cancer treatment. As of 9/23 patient is receiving chemotherapy via her Left Chest mediport, next scheduled infusions are: 10/06/22.  As of 10/22/22 patient and her MD, Dr [name] have placed her chemo on hold partly due to an abscessed tooth that needed to be pulled this past week (worry re blood stream infection) and partly because patient feels her body physically can't handle the chemo at this time. Patient also being followed by telephonic Palliative Care program but is not considering Hospice at this time. 10/18 Televisit with RNCM revealed patient needs to follow up with Cardiologist or [facility] ED regarding symptoms of fluid overload that pt described to nurse (see below) take all medications exactly as prescribed and will call provider for medication related questions as evidenced by  assertion of medication adherence and assertion of understanding that her providers are available to her for questions regarding any of her medications attend all scheduled medical appointments: Had multiple 10/06/22 appointmentswith Oncology  as evidenced by EMR documentation of completed appointments. Upcoming appts with Dr. Molli Hazard on 10/16, Dr. [name], Oncologist on 10/31, and palliative Care also on 10/31. continue to work with RN Care Manager to address care management and care coordination needs related to  her Stage IV Lung Cancer and multiple other co-morbidity diagnoses as evidenced by adherence to CM Team Scheduled appointments not experience hospital admission as evidenced by review of EMR. Hospital Admissions in last 6 months = 1x  through collaboration with RN Care manager, provider, and care team.   Interventions: Weekly collaboration x4 with Transition of Care RNCM to discuss ongoing progress in gaining physical strength and re-conditioning, preferably with HHS PT (TBA on 10/06/22 w/ MD), and to discuss ongoing goals of care in patient's Lung Cancer treatment plan. 10/11 Update - still has not heard back from [facility] re PT, provided patient with their ph# [phone] for follow up. Inter-disciplinary care team collaboration (see longitudinal plan of care) Evaluation of current treatment plan related to  self management and patient's adherence to plan as established by provider 10/11 Encouraged patient to call her PCP's office and speak to Triage Nurse or Provider as soon as we hung up to report increased swelling to her legs and "shiny" appearance to taut lower leg skin. Patient denied increased shortness of  breath or increased dyspnea on exertion. 10/18 Patient did not follow up with PCP (states she has new PCP that does not know her yet & still need to establish care with her previous PCP's replacement) nor did patient see Cardiology as scheduled as she felt too weak, immobile, and sick  to go to appointment. Urged patient to either call Cardiologist or go to [facility] ED to have c/o increased swelling above knees, shiny, taut skin, burning sensation to feet - feeling like open wounds present, increased dyspnea and fatigue when up and moving around, and inability to ambulate without walker around the house, and painfully & "not well" even with walker  Oncology:  (Status: ONgoing) Short Term Goal Assessment of understanding of oncology diagnosis:  Assessed patient understanding of cancer diagnosis and recommended treatment plan Reviewed upcoming provider appointments and treatment appointments Assessed available transportation to appointments and treatments. Has consistent/reliable transportation: Yes Assessed support system. Has consistent/reliable family or other support: Yes  Patient Goals/Self-Care Activities: Take all medications as prescribed Attend all scheduled provider appointments Call pharmacy for medication refills 3-7 days in advance of running out of medications Perform all self care activities independently  Call provider office for new concerns or questions   Follow Up Plan:  The patient has been provided with contact information for the care management team and has been advised to call with any health related questions or concerns. Next appointment with [facility] 10/27 at 1pm HOWEVER this RNCM will follow up with patient before 5pm today, 10/29/22 to check on patient's status and to see whether she sought medical help today.         Patient verbalizes understanding of instructions and care plan provided today and agrees to view in MyChart. Active MyChart status and patient understanding of how to access instructions and care plan via MyChart confirmed with patient.     The patient has been provided with contact information for the care management team and has been advised to call with any health related questions or concerns.   Please call the care  guide team at [phone] if you need to cancel or reschedule your appointment.   Please call 1-800-273-TALK (toll free, 24 hour hotline) if you are experiencing a Mental Health or Behavioral Health Crisis or need someone to talk to.  Alyse Low, RN, BA, [facility], CRRN [facility] Coordinator, Transition of Care Ph # [phone]

## 2022-10-29 NOTE — ED Triage Notes (Signed)
Bilateral leg edema +2. Tender. X "couple months" has been getting worse

## 2022-10-29 NOTE — Discharge Instructions (Addendum)
You were seen in the emergency room today for lower extremity edema.  Ultrasound of both legs show no sign of blood clot.  The chest x-ray you had today shows possible developing pneumonia, I have sent Levaquin which is an antibiotic please take as prescribed.  For lower extremity edema please wear compression stockings and continue to elevate your legs.  Please call cardiology and follow-up for today's visit and findings.  Return to the emergency room with any new or worsening symptoms.

## 2022-11-05 ENCOUNTER — Other Ambulatory Visit: Payer: Self-pay

## 2022-11-05 ENCOUNTER — Telehealth: Payer: Self-pay

## 2022-11-05 NOTE — Patient Instructions (Signed)
Visit Information  Dear Cassidy [name],  Thank you for taking time to visit with me today. Please don't hesitate to contact me if I can be of assistance to you before our next scheduled telephone appointment. And please call your PCP as soon as possible to follow up on your continued issues with increased pain & swelling to your thighs, lower legs, and feet, along with decreasing mobility, and inability to lift your legs.  Our next appointment is by telephone on 11/12/22 Friday at 3pm  Warm regards,  Elnita Maxwell  Following is a copy of your care plan:   Goals Addressed             This Visit's Progress    Patient Stated       1Current Barriers:  Knowledge Deficits related to plan of care for management of multiple co-morbidities including Stage IV Lung Cancer - currently receiving chemo, DM2, COPD, GERD, HTN, HLD, Depression, and CAD status post CABG 2023. Patient specifically needs help with "strength training" and regaining as much independence in caring for self as possible.   RNCM Clinical Goal(s):  Patient will verbalize basic understanding of  her Stage IV Lung Cancer disease process and self health management plan as evidenced by her understanding of goals of care in her Cancer treatment. As of 9/23 patient is receiving chemotherapy via her Left Chest mediport, next scheduled infusions are: 10/06/22.  As of 10/22/22 patient and her MD, Dr [name] have placed her chemo on hold partly due to an abscessed tooth that needed to be pulled this past week (worry re blood stream infection) and partly because patient feels her body physically can't handle the chemo at this time. Patient also being followed by telephonic Palliative Care program but is not considering Hospice at this time. 10/18 Televisit with RNCM revealed patient needs to follow up with Cardiologist or [facility] ED regarding symptoms of fluid overload that pt described to nurse (see below) take all medications exactly as prescribed and  will call provider for medication related questions as evidenced by assertion of medication adherence and assertion of understanding that her providers are available to her for questions regarding any of her medications attend all scheduled medical appointments: Had multiple 10/06/22 appointmentswith Oncology  as evidenced by EMR documentation of completed appointments. Upcoming appts with Dr. Molli Hazard on 10/16, Dr. [name], Oncologist on 10/31, and Palliative Care also on 10/31. continue to work with RN Care Manager to address care management and care coordination needs related to  her Stage IV Lung Cancer and multiple other co-morbidity diagnoses as evidenced by adherence to CM Team Scheduled appointments not experience hospital admission as evidenced by review of EMR. Hospital Admissions in last 6 months = 1x  through collaboration with RN Care manager, provider, and care team.   Interventions: Weekly collaboration x4 with Transition of Care RNCM to discuss ongoing progress in gaining physical strength and re-conditioning, preferably with HHS PT (TBA on 10/06/22 w/ MD), and to discuss ongoing goals of care in patient's Lung Cancer treatment plan. 10/11 Update - still has not heard back from [facility] re PT, provided patient with their ph# [phone] for follow up. Inter-disciplinary care team collaboration (see longitudinal plan of care) Evaluation of current treatment plan related to  self management and patient's adherence to plan as established by provider 10/11 Encouraged patient to call her PCP's office and speak to Triage Nurse or Provider as soon as we hung up to report increased swelling to her legs and "shiny"  appearance to taut lower leg skin. Patient denied increased shortness of breath or increased dyspnea on exertion. 10/18 Patient did not follow up with PCP (states she has new PCP that does not know her yet & still need to establish care with her previous PCP's replacement) nor did patient  see Cardiology as scheduled as she felt too weak, immobile, and sick to go to appointment. Urged patient to either call Cardiologist or go to [facility] ED to have c/o increased swelling above knees, shiny, taut skin, burning sensation to feet - feeling like open wounds present, increased dyspnea and fatigue when up and moving around, and inability to ambulate without walker around the house, and painfully & "not well" even with walker 10/18 patient went to [facility] ED as advised to rule out CHFe, DVT, or any other emergent issues. Patient found to have Pneumonia and was given RX for Levaquin and returned home with recommendation by ED MD to follow up with her PCP as soon as possible regarding her complaints of increased leg swelling, decreasing mobility, and sharp, stabbing pain to feet. 10/25 tele-visit with RNCM - advised patient to call her PCP as soon as we ended our follow up visit. Extended Transition of Care program by another week, to end 11/12/22 and then will recommend transitioning to Longitudinal CM Randolm Idol RN for continued Case management Nursing support.  Oncology:  (Status: ONgoing) Short Term Goal Assessment of understanding of oncology diagnosis:  Assessed patient understanding of cancer diagnosis and recommended treatment plan Reviewed upcoming provider appointments and treatment appointments Assessed available transportation to appointments and treatments. Has consistent/reliable transportation: Yes Assessed support system. Has consistent/reliable family or other support: Yes  Patient Goals/Self-Care Activities: Take all medications as prescribed Attend all scheduled provider appointments Call pharmacy for medication refills 3-7 days in advance of running out of medications Perform all self care activities independently  Call provider office for new concerns or questions   Follow Up Plan:  The patient has been provided with contact information for the care  management team and has been advised to call with any health related questions or concerns. Next appointment with [facility] 10/25 at 1pm HOWEVER this RNCM will follow up with patient before 5pm today, 10/29/22 to check on patient's status and to see whether she sought medical help today [Completed] Next appointment with RNCM is 11/12/22 @ 3pm         Patient verbalizes understanding of instructions and care plan provided today and agrees to view in MyChart. Active MyChart status and patient understanding of how to access instructions and care plan via MyChart confirmed with patient.     The patient has been provided with contact information for the care management team and has been advised to call with any health related questions or concerns.   Please call the care guide team at [phone] if you need to cancel or reschedule your appointment.   Please call 1-800-273-TALK (toll free, 24 hour hotline) if you are experiencing a Mental Health or Behavioral Health Crisis or need someone to talk to.  Alyse Low, RN, BA, [facility], CRRN [facility] Coordinator, Transition of Care Ph # [phone]

## 2022-11-05 NOTE — Patient Outreach (Signed)
Care Management  Transitions of Care Program Managed Medicaid Transitions of Care week 4   11/05/2022 Name: Cassidy [name] MRN: [id number] DOB: 09/30/1965  Subjective: Cassidy [name] is a 57 y.o. year old female who is a primary care patient of [name], MD. The Care Management team Engaged with patient Engaged with patient by telephone to assess and address transitions of care needs.   Consent to Services:  Patient was given information about Managed Medicaid Care Management services, agreed to services, and gave verbal consent to participate.   Assessment:           SDOH Interventions    Flowsheet Row Clinical Support from 09/08/2021 in [facility]  SDOH Interventions   Financial Strain Interventions Financial Counselor        Goals Addressed             This Visit's Progress    Patient Stated       1Current Barriers:  Knowledge Deficits related to plan of care for management of multiple co-morbidities including Stage IV Lung Cancer - currently receiving chemo, DM2, COPD, GERD, HTN, HLD, Depression, and CAD status post CABG 2023. Patient specifically needs help with "strength training" and regaining as much independence in caring for self as possible.   RNCM Clinical Goal(s):  Patient will verbalize basic understanding of  her Stage IV Lung Cancer disease process and self health management plan as evidenced by her understanding of goals of care in her Cancer treatment. As of 9/23 patient is receiving chemotherapy via her Left Chest mediport, next scheduled infusions are: 10/06/22.  As of 10/22/22 patient and her MD, Dr [name] have placed her chemo on hold partly due to an abscessed tooth that needed to be pulled this past week (worry re blood stream infection) and partly because patient feels her body physically can't handle the chemo at this time. Patient also being followed by telephonic Palliative Care program but is not  considering Hospice at this time. 10/18 Televisit with RNCM revealed patient needs to follow up with Cardiologist or [facility] ED regarding symptoms of fluid overload that pt described to nurse (see below) take all medications exactly as prescribed and will call provider for medication related questions as evidenced by assertion of medication adherence and assertion of understanding that her providers are available to her for questions regarding any of her medications attend all scheduled medical appointments: Had multiple 10/06/22 appointmentswith Oncology  as evidenced by EMR documentation of completed appointments. Upcoming appts with Dr. Molli Hazard on 10/16, Dr. [name], Oncologist on 10/31, and Palliative Care also on 10/31. continue to work with RN Care Manager to address care management and care coordination needs related to  her Stage IV Lung Cancer and multiple other co-morbidity diagnoses as evidenced by adherence to CM Team Scheduled appointments not experience hospital admission as evidenced by review of EMR. Hospital Admissions in last 6 months = 1x  through collaboration with RN Care manager, provider, and care team.   Interventions: Weekly collaboration x4 with Transition of Care RNCM to discuss ongoing progress in gaining physical strength and re-conditioning, preferably with HHS PT (TBA on 10/06/22 w/ MD), and to discuss ongoing goals of care in patient's Lung Cancer treatment plan. 10/11 Update - still has not heard back from [facility] re PT, provided patient with their ph# [phone] for follow up. Inter-disciplinary care team collaboration (see longitudinal plan of care) Evaluation of current treatment plan related to  self  management and patient's adherence to plan as established by provider 10/11 Encouraged patient to call her PCP's office and speak to Triage Nurse or Provider as soon as we hung up to report increased swelling to her legs and "shiny" appearance to taut lower leg skin.  Patient denied increased shortness of breath or increased dyspnea on exertion. 10/18 Patient did not follow up with PCP (states she has new PCP that does not know her yet & still need to establish care with her previous PCP's replacement) nor did patient see Cardiology as scheduled as she felt too weak, immobile, and sick to go to appointment. Urged patient to either call Cardiologist or go to [facility] ED to have c/o increased swelling above knees, shiny, taut skin, burning sensation to feet - feeling like open wounds present, increased dyspnea and fatigue when up and moving around, and inability to ambulate without walker around the house, and painfully & "not well" even with walker 10/18 patient went to [facility] ED as advised to rule out CHFe, DVT, or any other emergent issues. Patient found to have Pneumonia and was given RX for Levaquin and returned home with recommendation by ED MD to follow up with her PCP as soon as possible regarding her complaints of increased leg swelling, decreasing mobility, and sharp, stabbing pain to feet. 10/25 tele-visit with RNCM - advised patient to call her PCP as soon as we ended our follow up visit. Extended Transition of Care program by another week, to end 11/12/22 and then will recommend transitioning to Longitudinal CM Randolm Idol RN for continued Case management Nursing support.  Oncology:  (Status: ONgoing) Short Term Goal Assessment of understanding of oncology diagnosis:  Assessed patient understanding of cancer diagnosis and recommended treatment plan Reviewed upcoming provider appointments and treatment appointments Assessed available transportation to appointments and treatments. Has consistent/reliable transportation: Yes Assessed support system. Has consistent/reliable family or other support: Yes  Patient Goals/Self-Care Activities: Take all medications as prescribed Attend all scheduled provider appointments Call pharmacy for  medication refills 3-7 days in advance of running out of medications Perform all self care activities independently  Call provider office for new concerns or questions   Follow Up Plan:  The patient has been provided with contact information for the care management team and has been advised to call with any health related questions or concerns. Next appointment with [facility] 10/25 at 1pm HOWEVER this RNCM will follow up with patient before 5pm today, 10/29/22 to check on patient's status and to see whether she sought medical help today [Completed] Next appointment with RNCM is 11/12/22 @ 3pm         Plan: The patient has been provided with contact information for the care management team and has been advised to call with any health related questions or concerns.   Alyse Low, RN, BA, [facility], CRRN [facility] Coordinator, Transition of Care Ph # [phone]

## 2022-11-08 NOTE — Progress Notes (Deleted)
Palliative Medicine [facility]  Telephone:([phone]) [phone] Fax:(336) [phone]   Name: Cassidy [name] Date: 11/08/2022 MRN: [id number]  DOB: 05-08-1965  Patient Care Team: [name], MD as PCP - General (Family Medicine) O'Neal, Ronnald Ramp, MD as PCP - Cardiology (Cardiology) Emelia Loron, MD as Consulting Physician (General Surgery) Marcos Eke, RN as Registered Nurse   I connected with Cassidy [name] on 11/08/22 at 10:30 AM EDT by to and verified that I am speaking with the correct person using two identifiers.   I discussed the limitations, risks, security and privacy concerns of performing an evaluation and management service by telemedicine and the availability of in-person appointments. I also discussed with the patient that there may be a patient responsible charge related to this service. The patient expressed understanding and agreed to proceed.   Other persons participating in the visit and their role in the encounter: N/A  Patient's location: Home Provider's location: [facility]  INTERVAL HISTORY: Cassidy [name] is a 57 y.o. 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  Palliative ask to see for symptom management and goals of care.   SOCIAL HISTORY:    Cassidy [name] reports that she has quit smoking. Her smoking use included cigarettes. She started smoking about 43 years ago. She has a 21.9 pack-year smoking history. She has never used smokeless tobacco. She reports that she does not currently use drugs. She reports that she does not drink alcohol.  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  s/p PCTA to dRCA and ostial RPAV-PLA vessel + PDA branch (05/2012)  b. [place] s/p DES to mLAD - Resolute DES 3.0 x 22 (3.68mm -->3.3 mm)   Bipolar depression (HCC)    Breast abscess    a. right side.    COPD (chronic obstructive pulmonary disease) (HCC)    Depression    Diabetic peripheral neuropathy (HCC)    Fibromyalgia    Genital warts    GERD (gastroesophageal reflux disease)    History of hiatal hernia    HLD (hyperlipidemia)    Hypertension    Lung cancer (HCC)    Pain in limb    a. LE VENOUS DUPLEX, 02/05/2009 - no evidence of deep vein thrombosis, Baker's cyst   Pneumonia    PTSD (post-traumatic stress disorder)    Sickle cell trait (HCC)    Tobacco abuse    Tooth caries    Type II diabetes mellitus (HCC)     ALLERGIES:  is allergic to amoxil [amoxicillin], chantix [varenicline], suboxone [buprenorphine hcl-naloxone hcl], and emend [fosaprepitant dimeglumine].  MEDICATIONS:  Current Outpatient Medications  Medication Sig Dispense Refill   aspirin EC 81 MG tablet Take 81 mg by mouth daily.     atorvastatin (LIPITOR) 40 MG tablet TAKE 1 TABLET (40 MG TOTAL) BY MOUTH DAILY. 90 tablet 3   bisacodyl (DULCOLAX) 10 MG suppository Place 1 suppository (10 mg total) rectally daily as needed for severe constipation. 12 suppository 0   busPIRone (BUSPAR) 15 MG tablet Take 0.5 tablets (7.5 mg total) by mouth  2 (two) times daily. 60 tablet 3   Carboxymethylcellul-Glycerin (LUBRICATING EYE DROPS OP) Place 1 drop into both eyes daily as needed (dry eyes).     carvedilol (COREG) 12.5 MG tablet Take 1 tablet (12.5 mg total) by mouth 2 (two) times daily with a meal. 180 tablet 1   diclofenac Sodium (VOLTAREN) 1 % GEL APPLY 1 APPLICATION TOPICALLY 3 TIMES DAILY AS NEEDED FOR PAIN 200 g 1   docusate sodium (COLACE) 100 MG capsule Take 1 capsule (100 mg total) by mouth daily. 10 capsule 0    DULoxetine (CYMBALTA) 30 MG capsule Take 1 capsule (30 mg total) by mouth daily. 30 capsule 5   ferrous sulfate 325 (65 FE) MG tablet Take 325 mg by mouth daily with breakfast.     fluticasone (FLONASE) 50 MCG/ACT nasal spray PLACE 1 SPRAY INTO BOTH NOSTRILS DAILY AS NEEDED (CONGESTION). 16 g 1   fluticasone-salmeterol (ADVAIR) 100-50 MCG/ACT AEPB Inhale 1 puff into the lungs 2 (two) times daily. 1 each 3   folic acid (FOLVITE) 1 MG tablet Take 1 tablet (1 mg total) by mouth daily. 30 tablet 3   furosemide (LASIX) 40 MG tablet Take 1 tablet (40 mg total) by mouth daily as needed. If you gain 3 lbs in 24 hours or 5 lbs in 1 week 30 tablet 1   glucose blood (ACCU-CHEK AVIVA PLUS) test strip Use as instructed 50 each 1   hyoscyamine (LEVSIN SL) 0.125 MG SL tablet Place 1 tablet (0.125 mg total) under the tongue every 6 (six) hours as needed. For abdominal cramping 30 tablet 0   insulin glargine (LANTUS) 100 UNIT/ML Solostar Pen Inject 24 Units into the skin every morning. 3 mL 11   Insulin Pen Needle 32G X 4 MM MISC 1 Needle by Does not apply route in the morning and at bedtime. 120 each 2   ipratropium (ATROVENT HFA) 17 MCG/ACT inhaler Inhale 2 puffs into the lungs every 4 (four) hours as needed for wheezing (COPD).     lactulose (CHRONULAC) 10 GM/15ML solution Take 30 mLs (20 g total) by mouth 2 (two) times daily as needed for mild constipation, moderate constipation or severe constipation. 236 mL 0   levofloxacin (LEVAQUIN) 750 MG tablet Take 1 tablet (750 mg total) by mouth daily. 5 tablet 0   lidocaine-prilocaine (EMLA) cream APPLY 1 APPLICATION TOPICALLY AS NEEDED. 30 g 2   LORazepam (ATIVAN) 0.5 MG tablet Take 1 tablet (0.5 mg total) by mouth every 8 (eight) hours as needed for anxiety (nausea). 30 tablet 0   megestrol (MEGACE) 20 MG tablet Take 1 tablet (20 mg total) by mouth 2 (two) times daily. 60 tablet 0   metoCLOPramide (REGLAN) 10 MG tablet Take 1 tablet (10 mg total) by mouth every 8  (eight) hours as needed for nausea. 60 tablet 2   nicotine polacrilex (NICORETTE) 2 MG gum Take 1 each (2 mg total) by mouth as needed for smoking cessation. 100 tablet 0   nitroGLYCERIN (NITROSTAT) 0.4 MG SL tablet Place 1 tablet (0.4 mg total) under the tongue every 5 (five) minutes as needed for chest pain. 25 tablet 5   ondansetron (ZOFRAN-ODT) 8 MG disintegrating tablet Take 1 tablet (8 mg total) by mouth every 8 (eight) hours as needed for nausea or vomiting. 30 tablet 2   oxyCODONE ER (XTAMPZA ER) 13.5 MG C12A Take 13.5 mg by mouth every 12 (twelve) hours. 60 capsule 0   Oxycodone HCl 10 MG TABS Take 1 tablet (10  mg total) by mouth every 4 (four) hours as needed. 90 tablet 0   pantoprazole (PROTONIX) 40 MG tablet Take 1 tablet (40 mg total) by mouth 2 (two) times daily. 60 tablet 5   polyethylene glycol powder (GAVILAX) 17 GM/SCOOP powder TAKE ONE CAPFUL (17 GRAMS )BY MOUTH DAILY AS NEEDED FOR MILD OR MODERATE CONSTIPATION. 510 g 1   potassium chloride SA (KLOR-CON M) 20 MEQ tablet Take 1 tablet (20 mEq total) by mouth daily as needed. On days you take Lasix 30 tablet 1   prochlorperazine (COMPAZINE) 10 MG tablet TAKE 1 TABLET (10 MG TOTAL) BY MOUTH EVERY 6 (SIX) HOURS AS NEEDED FOR NAUSEA OR VOMITING. 30 tablet 0   senna-docusate (SENOKOT-S) 8.6-50 MG tablet Take 1 tablet by mouth 2 (two) times daily. 30 tablet 0   valACYclovir (VALTREX) 1000 MG tablet Take for 3 days prn each outbreak. (Patient taking differently: Take 1,000 mg by mouth daily. Take for 3 days prn each outbreak.) 30 tablet 11   No current facility-administered medications for this visit.    VITAL SIGNS: LMP 06/12/2016 (Approximate)  There were no vitals filed for this visit.  Estimated body mass index is 29.13 kg/m as calculated from the following:   Height as of 10/15/22: 5\' 7"  (1.702 m).   Weight as of 10/15/22: 186 lb (84.4 kg).   PERFORMANCE STATUS (ECOG) : 1 - Symptomatic but completely  ambulatory    IMPRESSION: .  I connected with Ms. Beazley by phone for symptom management follow-up.  No acute distress identified.  Patient shares she is scheduled for tooth extraction later today.  Is feeling somewhat better since placing chemotherapy on hold.  Continues to be challenged with lower extremity edema.  She is taking things one day at a time.  States she continues to remain hopeful for some stability in her health including other nononcology related symptoms.  Patient confirms that her treatment is on hold over the next month as discussed with her oncologist at last visit allow her time to recover.  She is emotional expressing she wants to do what is best for her and her quality of life.  I created space and opportunity allow patient to express her thoughts and feelings while offering support.   Neoplasm related pain Ms. Fernando reports that her pain is well controlled on current regimen. She is taking Xtampza 13 mg every 12 hours and Oxycodone 5-10 mg every 4 hours as needed for breakthrough pain. Does not require around the clock. Tolerating Cymbalta.   We will continue to closely monitor and support.   2.  Nausea Controlled with medication.   3. Constipation  Better controlled with regimen.  Last bowel movement on yesterday.  4. Goals of Care 09/21/22- Ms. Hermreck is emotional expressing understanding of cancer state. Shares emotionally her concerns of not "doing her best" right now. She knows that she has options to hold or discontinue treatments however expresses she is not ready to make this decision knowing her cancer would potentially progress. She is remaining hopeful for some improvement in her symptoms and quality of life. Emotional support provided.   We discussed the importance of continued conversation with family and their medical providers regarding overall plan of care and treatment options, ensuring decisions are within the context of the patients values and  GOCs.  PLAN: Xtampza 13 mg every 12 hours. Oxycodone IR 10 mg every 4-6 hours as needed for breakthrough pain. Senna-S and Miralax on a daily basis for the management  of constipation. Zofran, Compazine, and Ativan as needed for nausea Ongoing emotional support and symptom management. Palliative will plan to see patient back in 3-4 weeks in collaboration with her oncology appointments.   Patient expressed understanding and was in agreement with this plan. She also understands that She can call the clinic at any time with any questions, concerns, or complaints.    Any controlled substances utilized were prescribed in the context of palliative care. PDMP has been reviewed.   Any controlled substances utilized were prescribed in the context of palliative care. PDMP has been reviewed.    Visit consisted of counseling and education dealing with the complex and emotionally intense issues of symptom management and palliative care in the setting of serious and potentially life-threatening illness.  Willette Alma, AGPCNP-BC  Palliative Medicine Team/[facility]  *Please note that this is a verbal dictation therefore any spelling or grammatical errors are due to the "[employer]agon [employer] One" system interpretation.

## 2022-11-11 ENCOUNTER — Telehealth: Payer: Self-pay | Admitting: Medical Oncology

## 2022-11-11 ENCOUNTER — Ambulatory Visit: Payer: Medicaid Other

## 2022-11-11 ENCOUNTER — Inpatient Hospital Stay: Payer: Medicaid Other

## 2022-11-11 ENCOUNTER — Inpatient Hospital Stay: Payer: Medicaid Other | Admitting: Internal Medicine

## 2022-11-11 ENCOUNTER — Other Ambulatory Visit: Payer: Medicaid Other

## 2022-11-11 ENCOUNTER — Inpatient Hospital Stay: Payer: Medicaid Other | Admitting: Nurse Practitioner

## 2022-11-11 NOTE — Telephone Encounter (Signed)
Unable to get pt on the phone . LVM with her son to call.

## 2022-11-11 NOTE — Telephone Encounter (Signed)
Cassidy [name] returned my call and said Cassidy [name] told her she does not want anymore tx . She said Cassidy [name] will call back another time to cancer center to r/s her appt with [facility]. I emphasized to [facility] that it is important for Cassidy [name] to see [name] and express her wishes.

## 2022-11-11 NOTE — Telephone Encounter (Signed)
Dental abscess - is resolved. Completed treatment for pneumonia. Difficulty walking so she will call us when she is walking better and gets the swelling resolved.

## 2022-11-12 ENCOUNTER — Encounter: Payer: Self-pay | Admitting: Family Medicine

## 2022-11-12 ENCOUNTER — Other Ambulatory Visit: Payer: Self-pay

## 2022-11-12 ENCOUNTER — Telehealth: Payer: Self-pay

## 2022-11-12 NOTE — Patient Outreach (Signed)
  Care Management  Transitions of Care Program Managed Medicaid Transitions of Care week 3  11/12/2022 Name: Cassidy [name] MRN: [id number] DOB: [birth year]  Subjective: Cassidy [name] is a 57 y.o. year old female who is a primary care patient of [name], MD. The Care Management team was unable to reach the patient by phone to assess and address transitions of care needs.   Plan: Additional outreach attempts will be made to reach the patient enrolled in the [facility] Program (Post Inpatient/ED Visit).  I will call you again next week, Tuesday 11/16/22 @1pm   Alyse Low, RN, BA, [facility], CRRN [facility] Care Management Coordinator, Transition of Care Ph # [phone]

## 2022-11-16 ENCOUNTER — Other Ambulatory Visit: Payer: Self-pay

## 2022-11-16 ENCOUNTER — Telehealth: Payer: Self-pay

## 2022-11-16 NOTE — Patient Outreach (Signed)
Care Management  Transitions of Care Program Managed Medicaid Transitions of Care week 4   11/16/2022 Name: Cassidy [name] MRN: [id number] DOB: 1965-02-14  Subjective: Cassidy [name] is a 57 y.o. year old female who is a primary care patient of [name], MD. The Care Management team Engaged with patient Engaged with patient by telephone to assess and address transitions of care needs.   Consent to Services:  Patient was given information about Managed Medicaid Care Management services, agreed to services, and gave verbal consent to participate.   Assessment:           SDOH Interventions    Flowsheet Row Clinical Support from 09/08/2021 in [facility] [place]. [facility]  SDOH Interventions   Financial Strain Interventions Financial Counselor        Goals Addressed             This Visit's Progress    COMPLETED: Patient Stated       1Current Barriers:  Knowledge Deficits related to plan of care for management of multiple co-morbidities including Stage IV Lung Cancer - currently receiving chemo, DM2, COPD, GERD, HTN, HLD, Depression, and CAD status post CABG 2023. Patient specifically needs help with "strength training" and regaining as much independence in caring for self as possible.   RNCM Clinical Goal(s):  Patient will verbalize basic understanding of  her Stage IV Lung Cancer disease process and self health management plan as evidenced by her understanding of goals of care in her Cancer treatment. As of 9/23 patient is receiving chemotherapy via her Left Chest mediport, next scheduled infusions are: 10/06/22.  As of 10/22/22 patient and her MD, Dr [name] have placed her chemo on hold partly due to an abscessed tooth that needed to be pulled this past week (worry re blood stream infection) and partly because patient feels her body physically can't handle the chemo at this time. Patient also being followed by telephonic  Palliative Care program but is not considering Hospice at this time. 10/18 Televisit with RNCM revealed patient needs to follow up with Cardiologist or [facility] ED regarding symptoms of fluid overload that pt described to nurse (see below) 11/5 RNCM televisit update - patient has run out of several of her medications but because the scripts expired a while back, PCP wants to see patient before authorizing refills. Patient has an appointment with her PCP, Dr. [name] on Monday, 11/22/22. take all medications exactly as prescribed and will call provider for medication related questions as evidenced by assertion of medication adherence and assertion of understanding that her providers are available to her for questions regarding any of her medications attend all scheduled medical appointments: Completed all scheduled appointments with Oncology  as evidenced by EMR documentation. Specifically, patient completed appointments with Dr. Molli Hazard on 10/16, Dr. [name], Oncologist on 10/31, and Palliative Care also on 10/31. Has an important upcoming appt. With PCP on 11/11 as she has run out of several of her meds and needs new prescriptions. PCP must see her before authorizing additional scripts. continue to work with RN Care Manager to address care management and care coordination needs related to  her Stage IV Lung Cancer and multiple other co-morbidity diagnoses as evidenced by adherence to CM Team Scheduled appointments not experience hospital admission as evidenced by review of EMR. Hospital Admissions in last 6 months = 1x  through collaboration with RN Care manager, provider, and care team.   Interventions:  Weekly collaboration x4 with Transition of Care RNCM to discuss ongoing progress in gaining physical strength and re-conditioning, preferably with [facility] PT (TBA on 10/06/22 w/ MD), and to discuss ongoing goals of care in patient's Lung Cancer treatment plan. 10/11 Update - still has not heard back from  [facility] re PT, provided patient with their ph# [phone] for follow up. Inter-disciplinary care team collaboration (see longitudinal plan of care) Evaluation of current treatment plan related to  self management and patient's adherence to plan as established by provider 10/11 Encouraged patient to call her PCP's office and speak to Triage Nurse or Provider as soon as we hung up to report increased swelling to her legs and "shiny" appearance to taut lower leg skin. Patient denied increased shortness of breath or increased dyspnea on exertion. 10/18 Patient did not follow up with PCP (states she has new PCP that does not know her yet & still need to establish care with her previous PCP's replacement) nor did patient see Cardiology as scheduled as she felt too weak, immobile, and sick to go to appointment. Urged patient to either call Cardiologist or go to [facility] ED to have c/o increased swelling above knees, shiny, taut skin, burning sensation to feet - feeling like open wounds present, increased dyspnea and fatigue when up and moving around, and inability to ambulate without walker around the house, and painfully & "not well" even with walker 10/18 patient went to [facility] ED as advised to rule out CHFe, DVT, or any other emergent issues. Patient found to have Pneumonia and was given RX for Levaquin and returned home with recommendation by ED MD to follow up with her PCP as soon as possible regarding her complaints of increased leg swelling, decreasing mobility, and sharp, stabbing pain to feet. 10/25 tele-visit with RNCM - advised patient to call her PCP as soon as we ended our follow up visit. Extended Transition of Care program by another week, to end 11/12/22 and then will recommend transitioning to Longitudinal CM Randolm Idol RN for continued Case management Nursing support.  Oncology:  (Status: ONgoing) Short Term Goal Assessment of understanding of oncology diagnosis:   Assessed patient understanding of cancer diagnosis and recommended treatment plan Reviewed upcoming provider appointments and treatment appointments Assessed available transportation to appointments and treatments. Has consistent/reliable transportation: Yes Assessed support system. Has consistent/reliable family or other support: Yes  Patient Goals/Self-Care Activities: Take all medications as prescribed Attend all scheduled provider appointments Call pharmacy for medication refills 3-7 days in advance of running out of medications Perform all self care activities independently  Call provider office for new concerns or questions   Follow Up Plan:  The patient has been provided with contact information for the care management team and has been advised to call with any health related questions or concerns. Next appointment with [facility] 10/25 at 1pm HOWEVER this RNCM will follow up with patient before 5pm today, 10/29/22 to check on patient's status and to see whether she sought medical help today [Completed] Next appointment with RNCM is 11/12/22 @ 3pm (missed) Rescheduled and completed 11/16/22 - end of 30d Transition of Care program. Patient currently has chemo on hold and is exploring ancillary, holistic therapies for her Stage IV lung cancer - does not want to transition to Longitudinal CCM at this time. Does have palliative care on board.         Plan: The patient has been provided with contact information for the care management team and  has been advised to call with any health related questions or concerns.   Alyse Low, RN, BA, [facility], CRRN [facility] Coordinator, Transition of Care Ph # [phone]

## 2022-11-16 NOTE — Patient Instructions (Signed)
Visit Information  Dear Cassidy [name],  Thank you for taking time to visit with me today. It has been a pleasure and a privilege to get to know you - you are managing your lung cancer with the goal of enjoying quality time with family and friends for as long as possible, and you are definitely living that goal daily. Please don't hesitate to contact me in the future if I can be of assistance to you as this was our last tele-appointment following your hospitalization at the end of September.  Best wishes and Warm regards,  Elnita Maxwell    Following is a copy of your care plan:   Goals Addressed             This Visit's Progress    COMPLETED: Patient Stated       1Current Barriers:  Knowledge Deficits related to plan of care for management of multiple co-morbidities including Stage IV Lung Cancer - currently receiving chemo, DM2, COPD, GERD, HTN, HLD, Depression, and CAD status post CABG 2023. Patient specifically needs help with "strength training" and regaining as much independence in caring for self as possible.   RNCM Clinical Goal(s):  Patient will verbalize basic understanding of  her Stage IV Lung Cancer disease process and self health management plan as evidenced by her understanding of goals of care in her Cancer treatment. As of 9/23 patient is receiving chemotherapy via her Left Chest mediport, next scheduled infusions are: 10/06/22.  As of 10/22/22 patient and her MD, Dr [name] have placed her chemo on hold partly due to an abscessed tooth that needed to be pulled this past week (worry re blood stream infection) and partly because patient feels her body physically can't handle the chemo at this time. Patient also being followed by telephonic Palliative Care program but is not considering Hospice at this time. 10/18 Televisit with RNCM revealed patient needs to follow up with Cardiologist or [facility] ED regarding symptoms of fluid overload that pt described to nurse (see below) 11/5 RNCM  televisit update - patient has run out of several of her medications but because the scripts expired a while back, PCP wants to see patient before authorizing refills. Patient has an appointment with her PCP, Dr. [name] on Monday, 11/22/22. take all medications exactly as prescribed and will call provider for medication related questions as evidenced by assertion of medication adherence and assertion of understanding that her providers are available to her for questions regarding any of her medications attend all scheduled medical appointments: Completed all scheduled appointments with Oncology  as evidenced by EMR documentation. Specifically, patient completed appointments with Dr. Molli Hazard on 10/16, Dr. [name], Oncologist on 10/31, and Palliative Care also on 10/31. Has an important upcoming appt. With PCP on 11/11 as she has run out of several of her meds and needs new prescriptions. PCP must see her before authorizing additional scripts. continue to work with RN Care Manager to address care management and care coordination needs related to  her Stage IV Lung Cancer and multiple other co-morbidity diagnoses as evidenced by adherence to CM Team Scheduled appointments not experience hospital admission as evidenced by review of EMR. Hospital Admissions in last 6 months = 1x  through collaboration with RN Care manager, provider, and care team.   Interventions: Weekly collaboration x4 with Transition of Care RNCM to discuss ongoing progress in gaining physical strength and re-conditioning, preferably with [facility] PT (TBA on 10/06/22 w/ MD), and to discuss ongoing goals of care in  patient's Lung Cancer treatment plan. 10/11 Update - still has not heard back from [facility] re PT, provided patient with their ph# [phone] for follow up. Inter-disciplinary care team collaboration (see longitudinal plan of care) Evaluation of current treatment plan related to  self management and patient's adherence to plan  as established by provider 10/11 Encouraged patient to call her PCP's office and speak to Triage Nurse or Provider as soon as we hung up to report increased swelling to her legs and "shiny" appearance to taut lower leg skin. Patient denied increased shortness of breath or increased dyspnea on exertion. 10/18 Patient did not follow up with PCP (states she has new PCP that does not know her yet & still need to establish care with her previous PCP's replacement) nor did patient see Cardiology as scheduled as she felt too weak, immobile, and sick to go to appointment. Urged patient to either call Cardiologist or go to [facility] ED to have c/o increased swelling above knees, shiny, taut skin, burning sensation to feet - feeling like open wounds present, increased dyspnea and fatigue when up and moving around, and inability to ambulate without walker around the house, and painfully & "not well" even with walker 10/18 patient went to [facility] ED as advised to rule out CHFe, DVT, or any other emergent issues. Patient found to have Pneumonia and was given RX for Levaquin and returned home with recommendation by ED MD to follow up with her PCP as soon as possible regarding her complaints of increased leg swelling, decreasing mobility, and sharp, stabbing pain to feet. 10/25 tele-visit with RNCM - advised patient to call her PCP as soon as we ended our follow up visit. Extended Transition of Care program by another week, to end 11/12/22 and then will recommend transitioning to Longitudinal CM Randolm Idol RN for continued Case management Nursing support.  Oncology:  (Status: ONgoing) Short Term Goal Assessment of understanding of oncology diagnosis:  Assessed patient understanding of cancer diagnosis and recommended treatment plan Reviewed upcoming provider appointments and treatment appointments Assessed available transportation to appointments and treatments. Has consistent/reliable transportation:  Yes Assessed support system. Has consistent/reliable family or other support: Yes  Patient Goals/Self-Care Activities: Take all medications as prescribed Attend all scheduled provider appointments Call pharmacy for medication refills 3-7 days in advance of running out of medications Perform all self care activities independently  Call provider office for new concerns or questions   Follow Up Plan:  The patient has been provided with contact information for the care management team and has been advised to call with any health related questions or concerns. Next appointment with [facility] 10/25 at 1pm HOWEVER this RNCM will follow up with patient before 5pm today, 10/29/22 to check on patient's status and to see whether she sought medical help today [Completed] Next appointment with RNCM is 11/12/22 @ 3pm (missed) Rescheduled and completed 11/16/22 - end of 30d Transition of Care program. Patient currently has chemo on hold and is exploring ancillary, holistic therapies for her Stage IV lung cancer - does not want to transition to Longitudinal CCM at this time. Does have palliative care on board.         Patient verbalizes understanding of instructions and care plan provided today and agrees to view in MyChart. Active MyChart status and patient understanding of how to access instructions and care plan via MyChart confirmed with patient.     The patient has been provided with contact information for the care  management team and has been advised to call with any health related questions or concerns.   Please call the care guide team at [phone] if you need to cancel or reschedule your appointment.   Please call 1-800-273-TALK (toll free, 24 hour hotline) if you are experiencing a Mental Health or Behavioral Health Crisis or need someone to talk to.  Alyse Low, RN, BA, [facility], CRRN [facility] Coordinator, Transition of Care Ph # [phone]

## 2022-11-17 ENCOUNTER — Ambulatory Visit: Payer: Medicaid Other

## 2022-11-17 ENCOUNTER — Other Ambulatory Visit: Payer: Medicaid Other

## 2022-11-17 ENCOUNTER — Encounter: Payer: Medicaid Other | Admitting: Dietician

## 2022-11-17 ENCOUNTER — Ambulatory Visit: Payer: Medicaid Other | Admitting: Internal Medicine

## 2022-11-22 ENCOUNTER — Ambulatory Visit (INDEPENDENT_AMBULATORY_CARE_PROVIDER_SITE_OTHER): Payer: Medicaid Other | Admitting: Family Medicine

## 2022-11-22 ENCOUNTER — Encounter: Payer: Self-pay | Admitting: Family Medicine

## 2022-11-22 VITALS — BP 87/55 | HR 73 | Ht 67.0 in | Wt 193.0 lb

## 2022-11-22 DIAGNOSIS — Z794 Long term (current) use of insulin: Secondary | ICD-10-CM | POA: Diagnosis not present

## 2022-11-22 DIAGNOSIS — J45909 Unspecified asthma, uncomplicated: Secondary | ICD-10-CM | POA: Diagnosis not present

## 2022-11-22 DIAGNOSIS — R0602 Shortness of breath: Secondary | ICD-10-CM

## 2022-11-22 DIAGNOSIS — D649 Anemia, unspecified: Secondary | ICD-10-CM

## 2022-11-22 DIAGNOSIS — Z515 Encounter for palliative care: Secondary | ICD-10-CM

## 2022-11-22 DIAGNOSIS — J441 Chronic obstructive pulmonary disease with (acute) exacerbation: Secondary | ICD-10-CM | POA: Diagnosis not present

## 2022-11-22 DIAGNOSIS — K5903 Drug induced constipation: Secondary | ICD-10-CM

## 2022-11-22 DIAGNOSIS — I5032 Chronic diastolic (congestive) heart failure: Secondary | ICD-10-CM | POA: Diagnosis not present

## 2022-11-22 DIAGNOSIS — C3491 Malignant neoplasm of unspecified part of right bronchus or lung: Secondary | ICD-10-CM

## 2022-11-22 DIAGNOSIS — R1033 Periumbilical pain: Secondary | ICD-10-CM | POA: Diagnosis not present

## 2022-11-22 DIAGNOSIS — E119 Type 2 diabetes mellitus without complications: Secondary | ICD-10-CM

## 2022-11-22 MED ORDER — FLUTICASONE PROPIONATE 50 MCG/ACT NA SUSP
1.0000 | Freq: Every day | NASAL | 1 refills | Status: DC | PRN
Start: 2022-11-22 — End: 2023-02-23

## 2022-11-22 MED ORDER — DOCUSATE SODIUM 100 MG PO CAPS: 100.0000 mg | ORAL_CAPSULE | Freq: Every day | ORAL | 0 refills | Status: DC

## 2022-11-22 MED ORDER — DULERA 100-5 MCG/ACT IN AERO: 2.0000 | INHALATION_SPRAY | Freq: Two times a day (BID) | RESPIRATORY_TRACT | 3 refills | Status: AC

## 2022-11-22 MED ORDER — FUROSEMIDE 40 MG PO TABS
40.0000 mg | ORAL_TABLET | Freq: Every day | ORAL | 1 refills | Status: DC | PRN
Start: 2022-11-22 — End: 2022-11-22

## 2022-11-22 MED ORDER — POTASSIUM CHLORIDE CRYS ER 20 MEQ PO TBCR: 20.0000 meq | EXTENDED_RELEASE_TABLET | Freq: Every day | ORAL | 1 refills | Status: DC | PRN

## 2022-11-22 MED ORDER — POLYETHYLENE GLYCOL 3350 17 GM/SCOOP PO POWD: 17.0000 g | Freq: Every day | ORAL | 1 refills | Status: DC

## 2022-11-22 MED ORDER — DICLOFENAC SODIUM 1 % EX GEL
CUTANEOUS | 1 refills | Status: DC
Start: 2022-11-22 — End: 2022-12-28

## 2022-11-22 MED ORDER — FERROUS SULFATE 325 (65 FE) MG PO TABS: 325.0000 mg | ORAL_TABLET | Freq: Every day | ORAL | 2 refills | Status: AC

## 2022-11-22 NOTE — Progress Notes (Signed)
SUBJECTIVE:   CHIEF COMPLAINT / HPI:   Lower leg edema  Patient reports that she has been having lower leg edema for the last month that has been worsening.  She ran out of her Lasix as it was difficult for her to get a refill after Dr. Rosine Door left [facility].  She noticed that she has edema up to her mid thighs now.  This is very painful for her.  Denies shortness of breath at this time. She had shortness of breath one month ago when she went to the ED but was treated for pneumonia. Since then shortness of breath has improved patient just reports congestion. Denies chest pain.   Abdominal Distention and constipation  Patient reports medication related constipation as she is on multiple opioids for cancer related pain.  She is had ongoing struggle with constipation.  She has tried many different agents but says that MiraLAX and Colace worked the best for her.  She has not had a bowel movement in the last couple days.  Denies any vomiting.  However does say that in the last week she has had worsening abdominal distention as well as feeling of a central mass from her umbilicus to her suprapubic region.  Denies pain related to eating.  Blood pressure  She was increased carvedilol for the last 3 months from 12.5 mg to 12.5 mg BID due to palpitations. She fell last month and she has been light headed. Her blood pressure was lower recently in the last month than it is normally. She is not on any other blood pressure medications other than lasix.   PERTINENT  PMH / PSH: HTN, T2DM, CAD s/p angioplasty, Adenocarcinoma of the lung currently receiving treatment, COPD, HFpEF  OBJECTIVE:   BP (!) 87/55   Pulse 73   Ht 5\' 7"  (1.702 m)   Wt 193 lb (87.5 kg)   LMP 06/12/2016 (Approximate)   SpO2 100%   BMI 30.23 kg/m   General: well appearing, in no acute distress CV: RRR, radial pulses equal and palpable, 2+ edema bilaterally up to mid thigh, No JVD Resp: Normal work of breathing on room air, more short  of breath with movement, faint expiratory wheezes at the left lung base  Abd: distended, no fluid wave, tender to palpation in periumbilical and umbilical region with palpable hernia. No overlying erythema, abdomen is otherwise not tender. No rebound guarding, suprapubic thickened hyperpigmented skin  Foot: bilateral feet with edema, dorsal surface with hyperpigmentation and some peeling, no other ulcerations or lesions. Neuropathic pain, but sensation intact bilaterally   ASSESSMENT/PLAN:   Assessment & Plan Chronic heart failure with preserved ejection fraction (HCC) Patient most likely has lower leg edema due to worsening heart failure.  Could also be due to decreased albumin in the setting of metastatic cancer.  Was out of Lasix for a long time and recently restarted Lasix in the last week however blood pressure is unable to handle this at this time. - Told patient to hold Lasix at this time given that she is lightheaded with hypotension.  Patient most likely is intravascularly depleted. - Patient advised to go to the emergency department for fluids if she is lightheaded and cannot increase her fluids enough to fix her lightheadedness and blood pressure. - Advised patient to follow-up with her cardiologist within this week to address her heart failure medications and make any changes if needed.  Concern patient may be having cardiorenal syndrome causing low blood pressure. - BMP, BNP, urine  microalbumin/creatinine ratio Drug-induced constipation Patient with significant constipation.  Not concern for bowel obstruction at this time as she is not having any vomiting however she is high risk for bowel obstruction given metastases and chronic opioid use for cancer pain. - Refilled MiraLAX and Colace as these worked for patient. - Recommended patient take iron every other day rather than every day. Obstructive chronic bronchitis with exacerbation (HCC) Ordered patient's Advair however switched to  [facility] and his insurance coverage was for [employer].  Explained to patient that she can take this additionally twice in the day if needed for symptoms especially now that patient is still having some congestion and wheezing after her recent pneumonia. Periumbilical abdominal pain Exam concerning for a ventral hernia causing pain.  However distention is also concerning.  At this time no acute abdomen and given that pain is not out of proportion not concerned for ischemic bowel at this time.  However patient could be having metastases causing abdominal distention and inflammation. - CT abdomen pelvis - BMP - Patient was given precautions that if her pain is to increase, if she is to have vomiting or worsening lightheadedness that she should immediately go to the emergency department be evaluated.      [name], MD [facility]

## 2022-11-22 NOTE — Assessment & Plan Note (Signed)
Exam concerning for a ventral hernia causing pain.  However distention is also concerning.  At this time no acute abdomen and given that pain is not out of proportion not concerned for ischemic bowel at this time.  However patient could be having metastases causing abdominal distention and inflammation. - CT abdomen pelvis - BMP - Patient was given precautions that if her pain is to increase, if she is to have vomiting or worsening lightheadedness that she should immediately go to the emergency department be evaluated.

## 2022-11-22 NOTE — Patient Instructions (Signed)
It was wonderful to see you today.  Please bring ALL of your medications with you to every visit.   Today we talked about:  Lower leg swelling - I am worried about giving you lasix while you are dizzy and have a low blood pressure. We cannot monitor your blood pressure make sure you are ok while you are at home. I recommend if you are dizzy with this blood pressure you should get fluids in the ED.   For your abdomen I think you may have a hernia. If you have worsening pain, fever, or start vomiting you should go to the emergency department immediately. Otherwise I will order a CT of your abdomen.   I have refilled some of your medicines.   Please call your cardiologist to make an appointment within this week.   Please follow up in 2-3 weeks   Thank you for choosing [facility].   Please call [phone] with any questions about today's appointment.  Please be sure to schedule follow up at the front desk before you leave today.   [name], MD  Family Medicine

## 2022-11-22 NOTE — Assessment & Plan Note (Signed)
Ordered patient's Advair however switched to [facility] and his insurance coverage was for [employer].  Explained to patient that she can take this additionally twice in the day if needed for symptoms especially now that patient is still having some congestion and wheezing after her recent pneumonia.

## 2022-11-23 LAB — MICROALBUMIN / CREATININE URINE RATIO
Creatinine, Urine: 168.8 mg/dL
Microalb/Creat Ratio: 6 mg/g{creat} (ref 0–29)
Microalbumin, Urine: 10.4 ug/mL

## 2022-11-23 LAB — VITAMIN B12: Vitamin B-12: 911 pg/mL (ref 232–1245)

## 2022-11-23 LAB — BRAIN NATRIURETIC PEPTIDE: BNP: 36.9 pg/mL (ref 0.0–100.0)

## 2022-11-24 DIAGNOSIS — R531 Weakness: Secondary | ICD-10-CM | POA: Diagnosis not present

## 2022-11-24 DIAGNOSIS — R262 Difficulty in walking, not elsewhere classified: Secondary | ICD-10-CM | POA: Diagnosis not present

## 2022-11-24 DIAGNOSIS — M6281 Muscle weakness (generalized): Secondary | ICD-10-CM | POA: Diagnosis not present

## 2022-12-02 ENCOUNTER — Other Ambulatory Visit: Payer: Self-pay

## 2022-12-02 ENCOUNTER — Encounter: Payer: Self-pay | Admitting: Family Medicine

## 2022-12-02 ENCOUNTER — Ambulatory Visit: Payer: Medicaid Other

## 2022-12-02 ENCOUNTER — Encounter: Payer: Medicaid Other | Admitting: Nutrition

## 2022-12-02 ENCOUNTER — Other Ambulatory Visit: Payer: Medicaid Other

## 2022-12-02 ENCOUNTER — Ambulatory Visit: Payer: Medicaid Other | Admitting: Physician Assistant

## 2022-12-02 DIAGNOSIS — Z515 Encounter for palliative care: Secondary | ICD-10-CM

## 2022-12-02 DIAGNOSIS — R630 Anorexia: Secondary | ICD-10-CM

## 2022-12-02 DIAGNOSIS — G893 Neoplasm related pain (acute) (chronic): Secondary | ICD-10-CM

## 2022-12-02 DIAGNOSIS — R634 Abnormal weight loss: Secondary | ICD-10-CM

## 2022-12-02 DIAGNOSIS — C3491 Malignant neoplasm of unspecified part of right bronchus or lung: Secondary | ICD-10-CM

## 2022-12-02 MED ORDER — OXYCODONE HCL 10 MG PO TABS: 10.0000 mg | ORAL_TABLET | ORAL | 0 refills | Status: DC | PRN

## 2022-12-02 MED ORDER — XTAMPZA ER 13.5 MG PO C12A: 13.5000 mg | EXTENDED_RELEASE_CAPSULE | Freq: Two times a day (BID) | ORAL | 0 refills | Status: DC

## 2022-12-06 NOTE — Progress Notes (Unsigned)
Palliative Medicine [facility]  Telephone:([phone] Fax:([phone]) [phone]   Name: Cassidy [name] Date: 12/06/2022 MRN: [id number]  DOB: June 23, 1965  Patient Care Team: [name], MD as PCP - General (Family Medicine) O'Neal, Ronnald Ramp, MD as PCP - Cardiology (Cardiology) Emelia Loron, MD as Consulting Physician (General Surgery) Marcos Eke, RN as Registered Nurse   I connected with Cassidy [name] on 12/06/22 at  2:30 PM EST by to and verified that I am speaking with the correct person using two identifiers.   I discussed the limitations, risks, security and privacy concerns of performing an evaluation and management service by telemedicine and the availability of in-person appointments. I also discussed with the patient that there may be a patient responsible charge related to this service. The patient expressed understanding and agreed to proceed.   Other persons participating in the visit and their role in the encounter: N/A  Patient's location: Home Provider's location: [facility]  INTERVAL HISTORY: Cassidy [name] is a 57 y.o. 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  Palliative ask to see for symptom management and goals of care.   SOCIAL HISTORY:    Cassidy [name] reports that she has quit smoking. Her smoking use included cigarettes. She started smoking about 43 years ago. She has a 21.9 pack-year smoking history. She has never used smokeless tobacco. She reports that she does not currently use drugs. She reports that she does not drink alcohol.  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  s/p PCTA to dRCA and ostial RPAV-PLA vessel + PDA branch (05/2012)  b. [place] s/p DES to mLAD - Resolute DES 3.0 x 22 (3.38mm -->3.3 mm)   Bipolar depression (HCC)    Breast abscess    a. right side.    COPD (chronic obstructive pulmonary disease) (HCC)    Depression    Diabetic peripheral neuropathy (HCC)    Fibromyalgia    Genital warts    GERD (gastroesophageal reflux disease)    History of hiatal hernia    HLD (hyperlipidemia)    Hypertension    Lung cancer (HCC)    Pain in limb    a. LE VENOUS DUPLEX, 02/05/2009 - no evidence of deep vein thrombosis, Baker's cyst   Pneumonia    PTSD (post-traumatic stress disorder)    Sickle cell trait (HCC)    Tobacco abuse    Tooth caries    Type II diabetes mellitus (HCC)     ALLERGIES:  is allergic to amoxil [amoxicillin], chantix [varenicline], suboxone [buprenorphine hcl-naloxone hcl], and emend [fosaprepitant dimeglumine].  MEDICATIONS:  Current Outpatient Medications  Medication Sig Dispense Refill   aspirin EC 81 MG tablet Take 81 mg by mouth daily.     atorvastatin (LIPITOR) 40 MG tablet TAKE 1 TABLET (40 MG TOTAL) BY MOUTH DAILY. 90 tablet 3   busPIRone (BUSPAR) 15 MG tablet Take 0.5 tablets (7.5 mg total) by mouth 2 (two) times daily. 60 tablet 3   Carboxymethylcellul-Glycerin (LUBRICATING EYE DROPS OP) Place 1 drop into both eyes daily as  needed (dry eyes).     carvedilol (COREG) 12.5 MG tablet Take 1 tablet (12.5 mg total) by mouth 2 (two) times daily with a meal. 180 tablet 1   diclofenac Sodium (VOLTAREN) 1 % GEL Apply 1 application topically 3 times daily as needed for pain 200 g 1   docusate sodium (COLACE) 100 MG capsule Take 1 capsule (100 mg total) by mouth daily. 10 capsule 0   DULoxetine (CYMBALTA) 30 MG capsule Take 1 capsule (30 mg total) by mouth daily. 30 capsule 5   ferrous sulfate 325 (65 FE) MG tablet Take 1  tablet (325 mg total) by mouth daily with breakfast. 30 tablet 2   fluticasone (FLONASE) 50 MCG/ACT nasal spray Place 1 spray into both nostrils daily as needed (congestion). 16 g 1   folic acid (FOLVITE) 1 MG tablet Take 1 tablet (1 mg total) by mouth daily. 30 tablet 3   glucose blood (ACCU-CHEK AVIVA PLUS) test strip Use as instructed 50 each 1   hyoscyamine (LEVSIN SL) 0.125 MG SL tablet Place 1 tablet (0.125 mg total) under the tongue every 6 (six) hours as needed. For abdominal cramping 30 tablet 0   insulin glargine (LANTUS) 100 UNIT/ML Solostar Pen Inject 24 Units into the skin every morning. 3 mL 11   Insulin Pen Needle 32G X 4 MM MISC 1 Needle by Does not apply route in the morning and at bedtime. 120 each 2   ipratropium (ATROVENT HFA) 17 MCG/ACT inhaler Inhale 2 puffs into the lungs every 4 (four) hours as needed for wheezing (COPD).     lidocaine-prilocaine (EMLA) cream APPLY 1 APPLICATION TOPICALLY AS NEEDED. 30 g 2   LORazepam (ATIVAN) 0.5 MG tablet Take 1 tablet (0.5 mg total) by mouth every 8 (eight) hours as needed for anxiety (nausea). 30 tablet 0   metoCLOPramide (REGLAN) 10 MG tablet Take 1 tablet (10 mg total) by mouth every 8 (eight) hours as needed for nausea. 60 tablet 2   mometasone-formoterol (DULERA) 100-5 MCG/ACT AERO Inhale 2 puffs into the lungs in the morning and at bedtime. 13 g 3   nicotine polacrilex (NICORETTE) 2 MG gum Take 1 each (2 mg total) by mouth as needed for smoking cessation. 100 tablet 0   ondansetron (ZOFRAN-ODT) 8 MG disintegrating tablet Take 1 tablet (8 mg total) by mouth every 8 (eight) hours as needed for nausea or vomiting. 30 tablet 2   oxyCODONE ER (XTAMPZA ER) 13.5 MG C12A Take 13.5 mg by mouth every 12 (twelve) hours. 60 capsule 0   Oxycodone HCl 10 MG TABS Take 1 tablet (10 mg total) by mouth every 4 (four) hours as needed. 90 tablet 0   pantoprazole (PROTONIX) 40 MG tablet Take 1 tablet (40 mg total) by mouth 2 (two) times daily. 60 tablet 5    polyethylene glycol powder (GAVILAX) 17 GM/SCOOP powder Take 17 g by mouth daily. 850 g 1   prochlorperazine (COMPAZINE) 10 MG tablet TAKE 1 TABLET (10 MG TOTAL) BY MOUTH EVERY 6 (SIX) HOURS AS NEEDED FOR NAUSEA OR VOMITING. 30 tablet 0   valACYclovir (VALTREX) 1000 MG tablet Take for 3 days prn each outbreak. (Patient taking differently: Take 1,000 mg by mouth daily. Take for 3 days prn each outbreak.) 30 tablet 11   No current facility-administered medications for this visit.    VITAL SIGNS: LMP 06/12/2016 (Approximate)  There were no vitals filed for this visit.  Estimated body mass index is 30.23 kg/m as calculated from the following:  Height as of 11/22/22: 5\' 7"  (1.702 m).   Weight as of 11/22/22: 193 lb (87.5 kg).   PERFORMANCE STATUS (ECOG) : 1 - Symptomatic but completely ambulatory    IMPRESSION: .  I connected with Cassidy [name] by phone for symptom management follow-up.  No acute distress identified.  Patient shares she is scheduled for tooth extraction later today.  Is feeling somewhat better since placing chemotherapy on hold.  Continues to be challenged with lower extremity edema.  She is taking things one day at a time.  States she continues to remain hopeful for some stability in her health including other nononcology related symptoms.  Patient confirms that her treatment is on hold over the next month as discussed with her oncologist at last visit allow her time to recover.  She is emotional expressing she wants to do what is best for her and her quality of life.  I created space and opportunity allow patient to express her thoughts and feelings while offering support.   Neoplasm related pain Cassidy [name] reports that her pain is well controlled on current regimen. She is taking Xtampza 13 mg every 12 hours and Oxycodone 5-10 mg every 4 hours as needed for breakthrough pain. Does not require around the clock. Tolerating Cymbalta.   We will continue to closely monitor and  support.   2.  Nausea Controlled with medication.   3. Constipation  Better controlled with regimen.  Last bowel movement on yesterday.  4. Goals of Care 09/21/22- Cassidy [name] is emotional expressing understanding of cancer state. Shares emotionally her concerns of not "doing her best" right now. She knows that she has options to hold or discontinue treatments however expresses she is not ready to make this decision knowing her cancer would potentially progress. She is remaining hopeful for some improvement in her symptoms and quality of life. Emotional support provided.   We discussed the importance of continued conversation with family and their medical providers regarding overall plan of care and treatment options, ensuring decisions are within the context of the patients values and GOCs.  PLAN: Xtampza 13 mg every 12 hours. Oxycodone IR 10 mg every 4-6 hours as needed for breakthrough pain. Senna-S and Miralax on a daily basis for the management of constipation. Zofran, Compazine, and Ativan as needed for nausea Ongoing emotional support and symptom management. Palliative will plan to see patient back in 3-4 weeks in collaboration with her oncology appointments.   Patient expressed understanding and was in agreement with this plan. She also understands that She can call the clinic at any time with any questions, concerns, or complaints.    Any controlled substances utilized were prescribed in the context of palliative care. PDMP has been reviewed.   Any controlled substances utilized were prescribed in the context of palliative care. PDMP has been reviewed.    Visit consisted of counseling and education dealing with the complex and emotionally intense issues of symptom management and palliative care in the setting of serious and potentially life-threatening illness.  Willette Alma, AGPCNP-BC  Palliative Medicine Team/[facility]  *Please note that this is a  verbal dictation therefore any spelling or grammatical errors are due to the "[employer]agon [employer] One" system interpretation.

## 2022-12-08 ENCOUNTER — Inpatient Hospital Stay: Payer: Medicaid Other | Attending: Internal Medicine | Admitting: Nurse Practitioner

## 2022-12-08 ENCOUNTER — Encounter: Payer: Self-pay | Admitting: Nurse Practitioner

## 2022-12-08 DIAGNOSIS — Z515 Encounter for palliative care: Secondary | ICD-10-CM | POA: Diagnosis not present

## 2022-12-08 DIAGNOSIS — G893 Neoplasm related pain (acute) (chronic): Secondary | ICD-10-CM

## 2022-12-08 DIAGNOSIS — R530 Neoplastic (malignant) related fatigue: Secondary | ICD-10-CM

## 2022-12-08 DIAGNOSIS — C3491 Malignant neoplasm of unspecified part of right bronchus or lung: Secondary | ICD-10-CM

## 2022-12-08 DIAGNOSIS — Z7189 Other specified counseling: Secondary | ICD-10-CM

## 2022-12-13 ENCOUNTER — Inpatient Hospital Stay: Payer: Medicaid Other | Attending: Internal Medicine

## 2022-12-13 DIAGNOSIS — Z79899 Other long term (current) drug therapy: Secondary | ICD-10-CM | POA: Insufficient documentation

## 2022-12-13 DIAGNOSIS — M7989 Other specified soft tissue disorders: Secondary | ICD-10-CM | POA: Insufficient documentation

## 2022-12-13 DIAGNOSIS — C7972 Secondary malignant neoplasm of left adrenal gland: Secondary | ICD-10-CM | POA: Insufficient documentation

## 2022-12-13 DIAGNOSIS — K5900 Constipation, unspecified: Secondary | ICD-10-CM | POA: Insufficient documentation

## 2022-12-13 DIAGNOSIS — Z7951 Long term (current) use of inhaled steroids: Secondary | ICD-10-CM | POA: Insufficient documentation

## 2022-12-13 DIAGNOSIS — D573 Sickle-cell trait: Secondary | ICD-10-CM | POA: Insufficient documentation

## 2022-12-13 DIAGNOSIS — Z8774 Personal history of (corrected) congenital malformations of heart and circulatory system: Secondary | ICD-10-CM | POA: Insufficient documentation

## 2022-12-13 DIAGNOSIS — R188 Other ascites: Secondary | ICD-10-CM | POA: Insufficient documentation

## 2022-12-13 DIAGNOSIS — E119 Type 2 diabetes mellitus without complications: Secondary | ICD-10-CM | POA: Insufficient documentation

## 2022-12-13 DIAGNOSIS — C3411 Malignant neoplasm of upper lobe, right bronchus or lung: Secondary | ICD-10-CM | POA: Insufficient documentation

## 2022-12-13 DIAGNOSIS — M533 Sacrococcygeal disorders, not elsewhere classified: Secondary | ICD-10-CM | POA: Insufficient documentation

## 2022-12-13 DIAGNOSIS — I700 Atherosclerosis of aorta: Secondary | ICD-10-CM | POA: Insufficient documentation

## 2022-12-13 DIAGNOSIS — C7971 Secondary malignant neoplasm of right adrenal gland: Secondary | ICD-10-CM | POA: Insufficient documentation

## 2022-12-13 DIAGNOSIS — C787 Secondary malignant neoplasm of liver and intrahepatic bile duct: Secondary | ICD-10-CM | POA: Insufficient documentation

## 2022-12-13 DIAGNOSIS — Z880 Allergy status to penicillin: Secondary | ICD-10-CM | POA: Insufficient documentation

## 2022-12-13 DIAGNOSIS — J449 Chronic obstructive pulmonary disease, unspecified: Secondary | ICD-10-CM | POA: Insufficient documentation

## 2022-12-13 DIAGNOSIS — Z923 Personal history of irradiation: Secondary | ICD-10-CM | POA: Insufficient documentation

## 2022-12-13 DIAGNOSIS — Z9089 Acquired absence of other organs: Secondary | ICD-10-CM | POA: Insufficient documentation

## 2022-12-13 DIAGNOSIS — C7951 Secondary malignant neoplasm of bone: Secondary | ICD-10-CM | POA: Insufficient documentation

## 2022-12-13 DIAGNOSIS — I10 Essential (primary) hypertension: Secondary | ICD-10-CM | POA: Insufficient documentation

## 2022-12-13 DIAGNOSIS — R630 Anorexia: Secondary | ICD-10-CM | POA: Insufficient documentation

## 2022-12-13 DIAGNOSIS — J90 Pleural effusion, not elsewhere classified: Secondary | ICD-10-CM | POA: Insufficient documentation

## 2022-12-14 ENCOUNTER — Ambulatory Visit (HOSPITAL_COMMUNITY): Admission: RE | Admit: 2022-12-14 | Payer: Medicaid Other | Source: Ambulatory Visit

## 2022-12-14 ENCOUNTER — Telehealth: Payer: Self-pay | Admitting: Medical Oncology

## 2022-12-14 ENCOUNTER — Ambulatory Visit (HOSPITAL_COMMUNITY)
Admission: RE | Admit: 2022-12-14 | Discharge: 2022-12-14 | Disposition: A | Discharge status: Home / Self Care | Payer: Medicaid Other | Source: Ambulatory Visit | Attending: Family Medicine | Admitting: Family Medicine

## 2022-12-14 DIAGNOSIS — R188 Other ascites: Secondary | ICD-10-CM | POA: Diagnosis not present

## 2022-12-14 DIAGNOSIS — R932 Abnormal findings on diagnostic imaging of liver and biliary tract: Secondary | ICD-10-CM | POA: Diagnosis not present

## 2022-12-14 DIAGNOSIS — R140 Abdominal distension (gaseous): Secondary | ICD-10-CM | POA: Diagnosis not present

## 2022-12-14 DIAGNOSIS — R1033 Periumbilical pain: Secondary | ICD-10-CM | POA: Diagnosis present

## 2022-12-14 MED ORDER — HEPARIN SOD (PORK) LOCK FLUSH 100 UNIT/ML IV SOLN
INTRAVENOUS | Status: AC
  Filled 2022-12-14: qty 5

## 2022-12-14 MED ORDER — HEPARIN SOD (PORK) LOCK FLUSH 100 UNIT/ML IV SOLN
500.0000 [IU] | Freq: Once | INTRAVENOUS | Status: AC
  Administered 2022-12-14: 500 [IU] via INTRAVENOUS

## 2022-12-14 MED ORDER — IOHEXOL 300 MG/ML  SOLN
100.0000 mL | Freq: Once | INTRAMUSCULAR | Status: AC | PRN
  Administered 2022-12-14: 100 mL via INTRAVENOUS

## 2022-12-14 NOTE — Telephone Encounter (Signed)
Spoke to pt about her missed appt this week. She said she is more concerned about her hernia and the pain it is causing. Dr McDiarmid ordered CT .

## 2022-12-15 ENCOUNTER — Ambulatory Visit: Payer: Medicaid Other | Admitting: Family Medicine

## 2022-12-17 ENCOUNTER — Other Ambulatory Visit: Payer: Self-pay | Admitting: Internal Medicine

## 2022-12-17 ENCOUNTER — Encounter: Payer: Self-pay | Admitting: Internal Medicine

## 2022-12-20 ENCOUNTER — Encounter: Payer: Self-pay | Admitting: Internal Medicine

## 2022-12-20 ENCOUNTER — Telehealth: Payer: Self-pay | Admitting: Internal Medicine

## 2022-12-20 ENCOUNTER — Telehealth: Payer: Self-pay

## 2022-12-20 NOTE — Telephone Encounter (Signed)
Spoke with patient to follow up on how things are going. Pt stated that she has a lot of things going on but would like to have a visit with Dr. [name]. Sent schedule message.

## 2022-12-20 NOTE — Telephone Encounter (Signed)
Patient is aware of scheduled appointments per scheduling message on 12/16/2022

## 2022-12-23 ENCOUNTER — Ambulatory Visit: Payer: Medicaid Other

## 2022-12-23 ENCOUNTER — Ambulatory Visit: Payer: Medicaid Other | Admitting: Physician Assistant

## 2022-12-23 ENCOUNTER — Other Ambulatory Visit: Payer: Medicaid Other

## 2022-12-24 ENCOUNTER — Encounter: Payer: Self-pay | Admitting: Family Medicine

## 2022-12-24 NOTE — Telephone Encounter (Signed)
 Care team updated and letter sent for eye exam notes.

## 2022-12-28 ENCOUNTER — Ambulatory Visit: Payer: Medicaid Other | Admitting: Family Medicine

## 2022-12-28 ENCOUNTER — Encounter: Payer: Self-pay | Admitting: Family Medicine

## 2022-12-28 ENCOUNTER — Telehealth: Payer: Self-pay

## 2022-12-28 VITALS — BP 115/84 | HR 100 | Ht 67.0 in | Wt 187.8 lb

## 2022-12-28 DIAGNOSIS — Z794 Long term (current) use of insulin: Secondary | ICD-10-CM | POA: Diagnosis not present

## 2022-12-28 DIAGNOSIS — E119 Type 2 diabetes mellitus without complications: Secondary | ICD-10-CM | POA: Diagnosis not present

## 2022-12-28 DIAGNOSIS — C3491 Malignant neoplasm of unspecified part of right bronchus or lung: Secondary | ICD-10-CM

## 2022-12-28 DIAGNOSIS — R140 Abdominal distension (gaseous): Secondary | ICD-10-CM | POA: Diagnosis present

## 2022-12-28 MED ORDER — DICLOFENAC SODIUM 1 % EX GEL: CUTANEOUS | 1 refills | Status: AC

## 2022-12-28 NOTE — Telephone Encounter (Signed)
Patient wanted to be notified via [facility].

## 2022-12-28 NOTE — Patient Instructions (Signed)
Hi Cassidy [name],   I have tried to call the palliative care team and was only able to leave a message. Once I can talk to them I will call or mychart message you what we talked about.   You can take 20 mg of the opioid if you find this helpful.   I have officially changed you to comfort care in our system.   Please let me know if there is anything else I can do for you in the meantime.

## 2022-12-28 NOTE — Progress Notes (Signed)
    SUBJECTIVE:   CHIEF COMPLAINT / HPI:   F/u Abdominal distension and discomfort I Metastatic Adenocarcinoma of the Lung  Patient continues to have abdominal discomfort since last visit. Says her distension has also gotten worse. She feels her distension has added pressure to her lungs and sometimes makes it harder to breathe. She also says makes her feel full and causes discomfort after eating. No fevers or sharp pain. Nausea and vomiting has improved since stopping chemotherapy; however, patient says that she is tired, both physically and emotionally/mentally and would like to stop treatments to prolong her life. She has had many side effects from chemotherapy and cancer including incontinence, general weakness, and brain fog that she wants to now just be comfortable and focus on dignity preserving treatments.  She did have to recently increase her opioid to 20 mg, but feels much more relief and is able to interact with her family and pet cat now without as much discomfort.  She is in contact with her PMT and accidentally missed the last appointment but has a follow up.   PERTINENT  PMH / PSH: Adenocarcinoma of lung, DM, HTN  OBJECTIVE:   BP 115/84   Pulse 100   Ht 5\' 7"  (1.702 m)   Wt 187 lb 12.8 oz (85.2 kg)   LMP 06/12/2016 (Approximate)   SpO2 92%   BMI 29.41 kg/m   General: chronically ill appearing, appears older than stated age.in no acute distress CV: Well perfused, radial pulses equal and regular  Resp: Normal work of breathing on room air Abd: Distended and diffusely tender  Neuro: Alert & Oriented x 4  Psyc: tearful, consistent in thought process, relieved towards the end of conversation    ASSESSMENT/PLAN:   Assessment & Plan Abdominal distension Due to ascites as evidenced by recent CTAP. 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 Abdominal metastatic lesions have decreased in size compared to prior; however, ascites volume has increased. Could be due to a metabolic change in  cancer, anasarca, or increase in third spacing since discontinuing lasix. However, patient's BP could not tolerate the lasix either way. Does not seem to be infectious at this time given hemodynamically stable and afebrile. No sign of ischemia.  Would benefit from therapeutic paracentesis given significant volume and discomfort. Do not believe lasix could relieve the abdominal distension as well.  - US paracentesis scheduled for tomorrow. Labs ordered with paracentesis  - Hold lasix given fluid shifts with paracentesis can follow up and address afterwards as needed  Adenocarcinoma of right lung, stage 4 [facility]) Patient elects to be comfort care going forward.  Have left a message with palliative medicine team. However patient also counseled to reach out to them regarding changes in medications as well as other comfort measures. Has good support from her family and is a bright person. Change to comfort care should alleviate some of her medical anxiety  - Recommended increasing therapy appointments to weekly again      [name], MD [facility]

## 2022-12-28 NOTE — Assessment & Plan Note (Signed)
Patient elects to be comfort care going forward.  Have left a message with palliative medicine team. However patient also counseled to reach out to them regarding changes in medications as well as other comfort measures. Has good support from her family and is a bright person. Change to comfort care should alleviate some of her medical anxiety  - Recommended increasing therapy appointments to weekly again

## 2022-12-28 NOTE — Telephone Encounter (Signed)
Called to schedule patient an [place]  Spoke with Terri. Scheduled [place] at [facility] for 12/29/2022 at 2pm Patient must arrive 30 minutes early.  Drusilla Kanner, CMA

## 2022-12-28 NOTE — Assessment & Plan Note (Signed)
Due to ascites as evidenced by recent CTAP. 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 Abdominal metastatic lesions have decreased in size compared to prior; however, ascites volume has increased. Could be due to a metabolic change in cancer, anasarca, or increase in third spacing since discontinuing lasix. However, patient's BP could not tolerate the lasix either way. Does not seem to be infectious at this time given hemodynamically stable and afebrile. No sign of ischemia.  Would benefit from therapeutic paracentesis given significant volume and discomfort. Do not believe lasix could relieve the abdominal distension as well.  - US paracentesis scheduled for tomorrow. Labs ordered with paracentesis  - Hold lasix given fluid shifts with paracentesis can follow up and address afterwards as needed

## 2022-12-29 ENCOUNTER — Ambulatory Visit (HOSPITAL_COMMUNITY): Admission: RE | Admit: 2022-12-29 | Payer: Medicaid Other | Source: Ambulatory Visit

## 2022-12-30 ENCOUNTER — Other Ambulatory Visit: Payer: Self-pay | Admitting: Family Medicine

## 2022-12-30 ENCOUNTER — Encounter: Payer: Self-pay | Admitting: Family Medicine

## 2022-12-30 ENCOUNTER — Other Ambulatory Visit: Payer: Self-pay | Admitting: *Deleted

## 2022-12-30 ENCOUNTER — Other Ambulatory Visit: Payer: Self-pay | Admitting: Nurse Practitioner

## 2022-12-30 DIAGNOSIS — Z515 Encounter for palliative care: Secondary | ICD-10-CM

## 2022-12-30 DIAGNOSIS — D649 Anemia, unspecified: Secondary | ICD-10-CM

## 2022-12-30 DIAGNOSIS — C3491 Malignant neoplasm of unspecified part of right bronchus or lung: Secondary | ICD-10-CM

## 2022-12-30 DIAGNOSIS — R634 Abnormal weight loss: Secondary | ICD-10-CM

## 2022-12-30 DIAGNOSIS — R630 Anorexia: Secondary | ICD-10-CM

## 2022-12-30 DIAGNOSIS — G893 Neoplasm related pain (acute) (chronic): Secondary | ICD-10-CM

## 2022-12-30 DIAGNOSIS — I5032 Chronic diastolic (congestive) heart failure: Secondary | ICD-10-CM

## 2022-12-30 MED ORDER — XTAMPZA ER 13.5 MG PO C12A: 13.5000 mg | EXTENDED_RELEASE_CAPSULE | Freq: Two times a day (BID) | ORAL | 0 refills | Status: DC

## 2022-12-30 MED ORDER — OXYCODONE HCL 10 MG PO TABS: 10.0000 mg | ORAL_TABLET | ORAL | 0 refills | Status: DC | PRN

## 2022-12-31 ENCOUNTER — Inpatient Hospital Stay: Payer: Medicaid Other

## 2022-12-31 ENCOUNTER — Inpatient Hospital Stay (HOSPITAL_BASED_OUTPATIENT_CLINIC_OR_DEPARTMENT_OTHER): Payer: Medicaid Other | Admitting: Physician Assistant

## 2022-12-31 VITALS — BP 141/85 | HR 88 | Temp 97.4°F | Resp 18 | Ht 67.0 in | Wt 184.1 lb

## 2022-12-31 DIAGNOSIS — Z9089 Acquired absence of other organs: Secondary | ICD-10-CM | POA: Diagnosis not present

## 2022-12-31 DIAGNOSIS — M7989 Other specified soft tissue disorders: Secondary | ICD-10-CM | POA: Diagnosis not present

## 2022-12-31 DIAGNOSIS — D649 Anemia, unspecified: Secondary | ICD-10-CM

## 2022-12-31 DIAGNOSIS — J449 Chronic obstructive pulmonary disease, unspecified: Secondary | ICD-10-CM | POA: Diagnosis not present

## 2022-12-31 DIAGNOSIS — Z8774 Personal history of (corrected) congenital malformations of heart and circulatory system: Secondary | ICD-10-CM | POA: Diagnosis not present

## 2022-12-31 DIAGNOSIS — C7951 Secondary malignant neoplasm of bone: Secondary | ICD-10-CM | POA: Diagnosis not present

## 2022-12-31 DIAGNOSIS — C3491 Malignant neoplasm of unspecified part of right bronchus or lung: Secondary | ICD-10-CM

## 2022-12-31 DIAGNOSIS — C3411 Malignant neoplasm of upper lobe, right bronchus or lung: Secondary | ICD-10-CM | POA: Diagnosis present

## 2022-12-31 DIAGNOSIS — C787 Secondary malignant neoplasm of liver and intrahepatic bile duct: Secondary | ICD-10-CM | POA: Diagnosis not present

## 2022-12-31 DIAGNOSIS — R188 Other ascites: Secondary | ICD-10-CM | POA: Diagnosis not present

## 2022-12-31 DIAGNOSIS — C7972 Secondary malignant neoplasm of left adrenal gland: Secondary | ICD-10-CM | POA: Diagnosis not present

## 2022-12-31 DIAGNOSIS — Z79899 Other long term (current) drug therapy: Secondary | ICD-10-CM | POA: Diagnosis not present

## 2022-12-31 DIAGNOSIS — I10 Essential (primary) hypertension: Secondary | ICD-10-CM | POA: Diagnosis not present

## 2022-12-31 DIAGNOSIS — Z880 Allergy status to penicillin: Secondary | ICD-10-CM | POA: Diagnosis not present

## 2022-12-31 DIAGNOSIS — M533 Sacrococcygeal disorders, not elsewhere classified: Secondary | ICD-10-CM | POA: Diagnosis not present

## 2022-12-31 DIAGNOSIS — Z95828 Presence of other vascular implants and grafts: Secondary | ICD-10-CM

## 2022-12-31 DIAGNOSIS — I700 Atherosclerosis of aorta: Secondary | ICD-10-CM | POA: Diagnosis not present

## 2022-12-31 DIAGNOSIS — D573 Sickle-cell trait: Secondary | ICD-10-CM | POA: Diagnosis not present

## 2022-12-31 DIAGNOSIS — Z7951 Long term (current) use of inhaled steroids: Secondary | ICD-10-CM | POA: Diagnosis not present

## 2022-12-31 DIAGNOSIS — R630 Anorexia: Secondary | ICD-10-CM | POA: Diagnosis not present

## 2022-12-31 DIAGNOSIS — Z923 Personal history of irradiation: Secondary | ICD-10-CM | POA: Diagnosis not present

## 2022-12-31 DIAGNOSIS — C7971 Secondary malignant neoplasm of right adrenal gland: Secondary | ICD-10-CM | POA: Diagnosis not present

## 2022-12-31 DIAGNOSIS — K5900 Constipation, unspecified: Secondary | ICD-10-CM | POA: Diagnosis not present

## 2022-12-31 DIAGNOSIS — J90 Pleural effusion, not elsewhere classified: Secondary | ICD-10-CM | POA: Diagnosis not present

## 2022-12-31 DIAGNOSIS — E119 Type 2 diabetes mellitus without complications: Secondary | ICD-10-CM | POA: Diagnosis not present

## 2022-12-31 LAB — CBC WITH DIFFERENTIAL (CANCER CENTER ONLY)
Abs Immature Granulocytes: 0.02 10*3/uL (ref 0.00–0.07)
Basophils Absolute: 0 10*3/uL (ref 0.0–0.1)
Basophils Relative: 1 %
Eosinophils Absolute: 0.1 10*3/uL (ref 0.0–0.5)
Eosinophils Relative: 2 %
HCT: 28.3 % — ABNORMAL LOW (ref 36.0–46.0)
Hemoglobin: 9.8 g/dL — ABNORMAL LOW (ref 12.0–15.0)
Immature Granulocytes: 1 %
Lymphocytes Relative: 13 %
Lymphs Abs: 0.6 10*3/uL — ABNORMAL LOW (ref 0.7–4.0)
MCH: 32.7 pg (ref 26.0–34.0)
MCHC: 34.6 g/dL (ref 30.0–36.0)
MCV: 94.3 fL (ref 80.0–100.0)
Monocytes Absolute: 0.6 10*3/uL (ref 0.1–1.0)
Monocytes Relative: 13 %
Neutro Abs: 3.2 10*3/uL (ref 1.7–7.7)
Neutrophils Relative %: 70 %
Platelet Count: 327 10*3/uL (ref 150–400)
RBC: 3 MIL/uL — ABNORMAL LOW (ref 3.87–5.11)
RDW: 14.7 % (ref 11.5–15.5)
WBC Count: 4.4 10*3/uL (ref 4.0–10.5)
nRBC: 0 % (ref 0.0–0.2)

## 2022-12-31 LAB — CMP (CANCER CENTER ONLY)
ALT: <5 U/L (ref 0–44)
AST: 11 U/L — ABNORMAL LOW (ref 15–41)
Albumin: 3.1 g/dL — ABNORMAL LOW (ref 3.5–5.0)
Alkaline Phosphatase: 66 U/L (ref 38–126)
Anion gap: 7 (ref 5–15)
BUN: 13 mg/dL (ref 6–20)
CO2: 25 mmol/L (ref 22–32)
Calcium: 8.7 mg/dL — ABNORMAL LOW (ref 8.9–10.3)
Chloride: 107 mmol/L (ref 98–111)
Creatinine: 0.96 mg/dL (ref 0.44–1.00)
GFR, Estimated: >60 mL/min (ref >60)
Glucose, Bld: 125 mg/dL — ABNORMAL HIGH (ref 70–99)
Potassium: 3.6 mmol/L (ref 3.5–5.1)
Sodium: 139 mmol/L (ref 135–145)
Total Bilirubin: 0.5 mg/dL (ref <1.2)
Total Protein: 6.5 g/dL (ref 6.5–8.1)

## 2022-12-31 LAB — IRON AND IRON BINDING CAPACITY (CC-WL,HP ONLY)
Iron: 42 ug/dL (ref 28–170)
Saturation Ratios: 18 % (ref 10.4–31.8)
TIBC: 231 ug/dL — ABNORMAL LOW (ref 250–450)
UIBC: 189 ug/dL (ref 148–442)

## 2022-12-31 LAB — SAMPLE TO BLOOD BANK

## 2022-12-31 LAB — FOLATE: Folate: 20.7 ng/mL (ref >5.9)

## 2022-12-31 LAB — TSH: TSH: 5.222 u[IU]/mL — ABNORMAL HIGH (ref 0.350–4.500)

## 2022-12-31 LAB — FERRITIN: Ferritin: 377 ng/mL — ABNORMAL HIGH (ref 11–307)

## 2022-12-31 MED ORDER — SODIUM CHLORIDE 0.9% FLUSH
10.0000 mL | Freq: Once | INTRAVENOUS | Status: AC
Start: 2022-12-31 — End: 2022-12-31
  Administered 2022-12-31: 10 mL

## 2022-12-31 MED ORDER — HEPARIN SOD (PORK) LOCK FLUSH 100 UNIT/ML IV SOLN
500.0000 [IU] | Freq: Once | INTRAVENOUS | Status: AC
Start: 2022-12-31 — End: 2022-12-31
  Administered 2022-12-31: 500 [IU]

## 2022-12-31 NOTE — Progress Notes (Signed)
[facility] OFFICE PROGRESS NOTE  Cassidy Mola, MD [address] [place] [address]  DIAGNOSIS: Stage IV (T2a, N2, M1c) non-small cell lung cancer, adenocarcinoma presented with right upper lobe lung mass in addition to right hilar and mediastinal lymphadenopathy and innumerable bilateral pulmonary nodules as well as liver and bone metastasis.  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    Detected Alteration(s) / Biomarker(s) Associated FDA-approved therapies  Clinical Trial Availability      % cfDNA or Amplification   KRAS G12C  approved by FDA Adagrasib, Sotorasib Yes   5.3%   IDH1 R132L  approved in other indication Ivosidenib, Olutasidenib Yes      1.9%   PD-L1 expression 89%   PRIOR THERAPY: 1) Palliative radiation to the painful metastatic bone lesions at L4 and the sacrum under the care of Dr. Mitzi Hansen. Last day on 10/22/21  2) Systemic chemotherapy with carboplatin for AUC of 5, Alimta 500 Mg/M2 and Keytruda 200 Mg IV every 3 weeks.  First dose September 02, 2021.  Status post 19 cycles.  Starting from cycle #5, the patient will start maintenance Alimta and Keytruda IV every 3 weeks. Starting from cycle #10, reduced the dose of Alimta to 400 mg/m2 due to intolerance. Last dose on 09/15/22. Discontinued due to patient request  CURRENT THERAPY: Observation   INTERVAL HISTORY: Cassidy [name] 57 y.o. female returns to the clinic today for follow-up visit.  The patient has not been seen in the clinic since 10/06/2022.  She was having some dental work performed at that time and her treatment was delayed by 2 weeks.  The patient was lost to follow-up since that time.  The patient wanted to try taking a break from treatment due to the adverse side effects.  Since she has been off treatment she has not been having any nausea or vomiting.  However she has been struggling with swelling and ascites.  Her PCP ordered a paracentesis which is scheduled for 01/05/2023 in  addition to further testing on the fluid.  Was ordered based off of a restaging CT scan from 12/14/2022 that showed large volume ascites throughout the abdomen and pelvis and stable low-density lesion in the hepatic lobe. The scan did not mention any malignancy in the abdomen or pelvis.  He had diffuse edema throughout the soft tissue compatible with anasarca.  The patient is also having lower extremity swelling.  Of note her most recent echocardiogram was on 09/29/2022.  Her EF was 60 to 65%.  He was found to have some grade 1 diastolic dysfunction and some right ventricular systolic reduced function.  He has also been having some worsening anemia.  She is having some cold intolerance due to this.  Denies any fevers.  She was previously receiving B12 injections in the clinic when she was on Alimta.  She is wondering if she needs to receive this.  She reports a poor appetite.  Her poor appetite is secondary to bloating from the ascites.  She reports her dyspnea on exertion is about the same as when she was last seen.  Denies any cough, chest pain, or hemoptysis.  She is on chronic pain management and does have some constipation but she is using a bowel regimen for this.  Denies any headache or visual changes. She is wondering if she can have dental fillings done.  She is here today to get back on track with her appointments.   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  s/p PCTA to dRCA and ostial RPAV-PLA vessel + PDA branch (05/2012)  b. [place] s/p DES to mLAD - Resolute DES 3.0 x 22 (3.39mm -->3.3 mm)   Bipolar depression (HCC)    Breast abscess    a. right side.    COPD (chronic obstructive pulmonary disease) (HCC)    Depression    Diabetic peripheral neuropathy (HCC)    Fibromyalgia    Genital  warts    GERD (gastroesophageal reflux disease)    History of hiatal hernia    HLD (hyperlipidemia)    Hypertension    Lung cancer (HCC)    Pain in limb    a. LE VENOUS DUPLEX, 02/05/2009 - no evidence of deep vein thrombosis, Baker's cyst   Pneumonia    PTSD (post-traumatic stress disorder)    Sickle cell trait (HCC)    Tobacco abuse    Tooth caries    Type II diabetes mellitus (HCC)     ALLERGIES:  is allergic to amoxil [amoxicillin], chantix [varenicline], suboxone [buprenorphine hcl-naloxone hcl], and emend [fosaprepitant dimeglumine].  MEDICATIONS:  Current Outpatient Medications  Medication Sig Dispense Refill   aspirin EC 81 MG tablet Take 81 mg by mouth daily.     atorvastatin (LIPITOR) 40 MG tablet TAKE 1 TABLET (40 MG TOTAL) BY MOUTH DAILY. 90 tablet 3   busPIRone (BUSPAR) 15 MG tablet Take 0.5 tablets (7.5 mg total) by mouth 2 (two) times daily. 60 tablet 3   Carboxymethylcellul-Glycerin (LUBRICATING EYE DROPS OP) Place 1 drop into both eyes daily as needed (dry eyes).     carvedilol (COREG) 12.5 MG tablet Take 1 tablet (12.5 mg total) by mouth 2 (two) times daily with a meal. 180 tablet 1   diclofenac Sodium (VOLTAREN) 1 % GEL Apply 1 application topically 3 times daily as needed for pain 200 g 1   docusate sodium (COLACE) 100 MG capsule Take 1 capsule (100 mg total) by mouth daily. 10 capsule 0   DULoxetine (CYMBALTA) 30 MG capsule Take 1 capsule (30 mg total) by mouth daily. 30 capsule 5   ferrous sulfate 325 (65 FE) MG tablet Take 1 tablet (325 mg total) by mouth daily with breakfast. 30 tablet 2   fluticasone (FLONASE) 50 MCG/ACT nasal spray Place 1 spray into both nostrils daily as needed (congestion). 16 g 1   folic acid (FOLVITE) 1 MG tablet TAKE 1 TABLET (1 MG TOTAL) BY MOUTH DAILY. 30 tablet 3   glucose blood (ACCU-CHEK AVIVA PLUS) test strip Use as instructed 50 each 1   hyoscyamine (LEVSIN SL) 0.125 MG SL tablet Place 1 tablet (0.125 mg total) under the tongue  every 6 (six) hours as needed. For abdominal cramping 30 tablet 0   insulin glargine (LANTUS) 100 UNIT/ML Solostar Pen Inject 24 Units into the skin every morning. 3 mL 11   Insulin Pen Needle 32G X 4 MM MISC 1 Needle by Does not apply route in the morning and at bedtime. 120 each 2   ipratropium (ATROVENT HFA) 17 MCG/ACT inhaler Inhale 2 puffs into the lungs every 4 (four) hours as needed for wheezing (COPD).     lidocaine-prilocaine (EMLA) cream APPLY 1 APPLICATION TOPICALLY AS NEEDED. 30 g 2   LORazepam (ATIVAN) 0.5 MG tablet Take 1  tablet (0.5 mg total) by mouth every 8 (eight) hours as needed for anxiety (nausea). 30 tablet 0   metoCLOPramide (REGLAN) 10 MG tablet Take 1 tablet (10 mg total) by mouth every 8 (eight) hours as needed for nausea. 60 tablet 2   mometasone-formoterol (DULERA) 100-5 MCG/ACT AERO Inhale 2 puffs into the lungs in the morning and at bedtime. 13 g 3   nicotine polacrilex (NICORETTE) 2 MG gum Take 1 each (2 mg total) by mouth as needed for smoking cessation. 100 tablet 0   ondansetron (ZOFRAN-ODT) 8 MG disintegrating tablet Take 1 tablet (8 mg total) by mouth every 8 (eight) hours as needed for nausea or vomiting. 30 tablet 2   oxyCODONE ER (XTAMPZA ER) 13.5 MG C12A Take 13.5 mg by mouth every 12 (twelve) hours. 60 capsule 0   Oxycodone HCl 10 MG TABS Take 1 tablet (10 mg total) by mouth every 4 (four) hours as needed. 90 tablet 0   pantoprazole (PROTONIX) 40 MG tablet Take 1 tablet (40 mg total) by mouth 2 (two) times daily. 60 tablet 5   polyethylene glycol powder (GAVILAX) 17 GM/SCOOP powder Take 17 g by mouth daily. 850 g 1   prochlorperazine (COMPAZINE) 10 MG tablet TAKE 1 TABLET (10 MG TOTAL) BY MOUTH EVERY 6 (SIX) HOURS AS NEEDED FOR NAUSEA OR VOMITING. 30 tablet 0   valACYclovir (VALTREX) 1000 MG tablet Take for 3 days prn each outbreak. (Patient taking differently: Take 1,000 mg by mouth daily. Take for 3 days prn each outbreak.) 30 tablet 11   No current  facility-administered medications for this visit.    SURGICAL HISTORY:  Past Surgical History:  Procedure Laterality Date   BLADDER SURGERY  1980   "TVT"   BLADDER SURGERY  2003   "TVT"   BREAST EXCISIONAL BIOPSY     BREAST SURGERY Right    I&D for multiple abscesses   CARDIAC CATHETERIZATION     CORONARY ARTERY BYPASS GRAFT N/A 06/30/2021   Procedure: CORONARY ARTERY BYPASS GRAFTING (CABG) x3 USING LEFT INTERNAL MAMMARY ARTERY,  RIGHT RADIAL ARTERY AND LEFT GREATER SAPHENOUS VEIN;  Surgeon: Corliss Skains, MD;  Location: [facility] OR;  Service: Open Heart Surgery;  Laterality: N/A;   cutting balloon     ENDOVEIN HARVEST OF GREATER SAPHENOUS VEIN Left 06/30/2021   Procedure: ENDOVEIN HARVEST OF GREATER SAPHENOUS VEIN;  Surgeon: Corliss Skains, MD;  Location: [facility] OR;  Service: Open Heart Surgery;  Laterality: Left;   EYE SURGERY Left    laser surgery   FINE NEEDLE ASPIRATION  08/21/2021   Procedure: FINE NEEDLE ASPIRATION (FNA) LINEAR;  Surgeon: Josephine Igo, DO;  Location: [facility] ENDOSCOPY;  Service: Pulmonary;;   INCISE AND DRAIN ABCESS  ; 04/26/11; 08/13/11   right breast   INCISION AND DRAINAGE ABSCESS Right 05/09/2012   Procedure: INCISION AND DRAINAGE RIGHT BREAST ABSCESS;  Surgeon: Wilmon Arms. Corliss Skains, MD;  Location: [facility] OR;  Service: General;  Laterality: Right;   INCISION AND DRAINAGE ABSCESS Right 02/08/2014   Procedure: INCISION AND DRAINAGE RIGHT BREAST ABSCESS;  Surgeon: Manus Rudd, MD;  Location: [facility] OR;  Service: General;  Laterality: Right;   INCISION AND DRAINAGE ABSCESS Right 06/14/2014   Procedure: INCISION AND DRAINAGE RIGHT BREAST ABSCESS;  Surgeon: Manus Rudd, MD;  Location: [facility] OR;  Service: General;  Laterality: Right;   IR IMAGING GUIDED PORT INSERTION  12/08/2021   IRRIGATION AND DEBRIDEMENT ABSCESS  12/19/2010   Procedure: IRRIGATION AND DEBRIDEMENT ABSCESS;  Surgeon: Emelia Loron,  MD;  Location: [facility] OR;  Service: General;  Laterality: Right;   IRRIGATION  AND DEBRIDEMENT ABSCESS  08/13/2011   Procedure: MINOR INCISION AND DRAINAGE OF ABSCESS;  Surgeon: Cherylynn Ridges, MD;  Location: [facility] OR;  Service: General;  Laterality: Right;  Right Breast    IRRIGATION AND DEBRIDEMENT ABSCESS  11/17/2011   Procedure: IRRIGATION AND DEBRIDEMENT ABSCESS;  Surgeon: Wilmon Arms. Corliss Skains, MD;  Location: [facility];  Service: General;  Laterality: Right;  irrigation and debridement right recurrent breast abscess   LARYNX SURGERY     LEFT HEART CATH AND CORONARY ANGIOGRAPHY N/A 04/09/2021   Procedure: LEFT HEART CATH AND CORONARY ANGIOGRAPHY;  Surgeon: Corky Crafts, MD;  Location: [facility] INVASIVE CV LAB;  Service: Cardiovascular;  Laterality: N/A;   LEFT HEART CATHETERIZATION WITH CORONARY ANGIOGRAM N/A 05/29/2012   Procedure: LEFT HEART CATHETERIZATION WITH CORONARY ANGIOGRAM & PTCA;  Surgeon: Lennette Bihari, MD;  Location: [facility] CATH LAB;  Service: Cardiovascular; Bifurcation dRCA-RPAD/PLA & rPDA  PTCA.   LEFT HEART CATHETERIZATION WITH CORONARY ANGIOGRAM N/A 11/08/2013   Procedure: LEFT HEART CATHETERIZATION WITH CORONARY ANGIOGRAM and Coronary Stent Intervention;  Surgeon: Marykay Lex, MD;  Location: [facility] CATH LAB;  Service: Cardiovascular; mLAD 80% (FFR 0.73) - resolute DES 3.0 x 22 mm (postdilated to 3.3 mm->3.5 mm)   NM MYOVIEW LTD  01/26/2009   Normal study, no evidence of ischemia, EF 67%   ployp removed from voice box 03/30/12     RADIAL ARTERY HARVEST Right 06/30/2021   Procedure: RADIAL ARTERY HARVEST;  Surgeon: Corliss Skains, MD;  Location: [facility];  Service: Open Heart Surgery;  Laterality: Right;   REFRACTIVE SURGERY  ~ 2010   right   TEE WITHOUT CARDIOVERSION N/A 06/30/2021   Procedure: TRANSESOPHAGEAL ECHOCARDIOGRAM (TEE);  Surgeon: Corliss Skains, MD;  Location: [facility] OR;  Service: Open Heart Surgery;  Laterality: N/A;   TONSILLECTOMY  1988   VIDEO BRONCHOSCOPY WITH ENDOBRONCHIAL ULTRASOUND Bilateral 08/21/2021   Procedure: VIDEO BRONCHOSCOPY WITH  ENDOBRONCHIAL ULTRASOUND;  Surgeon: Josephine Igo, DO;  Location: [facility] ENDOS[facility];  Service: Pulmonary;  Laterality: Bilateral;    REVIEW OF SYSTEMS:   Review of Systems  Constitutional: Positive for decreased appetite, fatigue, and weight loss.  Negative for  chills fever.   HENT:   Negative for mouth sores, nosebleeds, sore throat and trouble swallowing.   Eyes: Negative for eye problems and icterus.  Respiratory: Positive for stable dyspnea on exertion. Negative for cough, hemoptysis, and wheezing.   Cardiovascular: Negative for chest pain. Positive for leg swelling.  Gastrointestinal: Positive for bloating and early satiety. Positive for occasional constipation. Negative for diarrhea, nausea and vomiting.  Genitourinary: Negative for bladder incontinence, difficulty urinating, dysuria, frequency and hematuria.   Musculoskeletal: Positive for chronic back pain. Negative for gait problem, neck pain and neck stiffness.  Skin: Negative for itching and rash.  Neurological: Negative for dizziness, extremity weakness, gait problem, headaches, light-headedness and seizures.  Hematological: Negative for adenopathy. Does not bruise/bleed easily.  Psychiatric/Behavioral: Negative for confusion, depression and sleep disturbance. The patient is not nervous/anxious.     PHYSICAL EXAMINATION:  Blood pressure (!) 141/85, pulse 88, temperature (!) 97.4 F (36.3 C), temperature source Temporal, resp. rate 18, height 5\' 7"  (1.702 m), weight 184 lb 1.6 oz (83.5 kg), last menstrual period 06/12/2016, SpO2 100%.  ECOG PERFORMANCE STATUS: 1  Physical Exam  Constitutional: Oriented to person, place, and time and well-developed, well-nourished, and in no distress.  HENT:  Head: Normocephalic  and atraumatic. I did not appreciate significant swelling of the right face or eyelid. No eye tearing at this time.  Mouth/Throat: Oropharynx is clear and moist. No oropharyngeal exudate.  Eyes: Conjunctivae are normal.  Right eye exhibits no discharge. Left eye exhibits no discharge. No scleral icterus.  Neck: Normal range of motion. Neck supple.  Cardiovascular: Normal rate, regular rhythm, normal heart sounds and intact distal pulses.   Pulmonary/Chest: Effort normal and breath sounds normal. No respiratory distress. No wheezes. No rales.  Abdominal: Distended abdomen/firm. Positive for ascites. Bowel sounds are normal. Exhibits no distension and no mass. There is no tenderness.  Musculoskeletal: Normal range of motion. Positive for edema.  Lymphadenopathy:    No cervical adenopathy.  Neurological: Alert and oriented to person, place, and time. Exhibits normal muscle tone. Gait normal. Coordination normal.  Skin: Skin is warm and dry.  + Darkening on right foot.  Some skin darkening and erythema in the central abdominal area which is reportedly improved compared to prior although I have not seen the patient since April) not diaphoretic. No pallor.  Psychiatric: Normal for memory and judgment normal.  Vitals reviewed.    LABORATORY DATA: Lab Results  Component Value Date   WBC 4.4 12/31/2022   HGB 9.8 (L) 12/31/2022   HCT 28.3 (L) 12/31/2022   MCV 94.3 12/31/2022   PLT 327 12/31/2022      Chemistry      Component Value Date/Time   NA 139 12/31/2022 0846   NA 140 04/03/2021 1059   K 3.6 12/31/2022 0846   CL 107 12/31/2022 0846   CO2 25 12/31/2022 0846   BUN 13 12/31/2022 0846   BUN 13 04/03/2021 1059   CREATININE 0.96 12/31/2022 0846   CREATININE 0.65 11/06/2013 1520      Component Value Date/Time   CALCIUM 8.7 (L) 12/31/2022 0846   ALKPHOS 66 12/31/2022 0846   AST 11 (L) 12/31/2022 0846   ALT <5 12/31/2022 0846   BILITOT 0.5 12/31/2022 0846       RADIOGRAPHIC STUDIES:  CT ABDOMEN PELVIS W CONTRAST Result Date: 12/19/2022 CLINICAL DATA:  Increasing distension, abdominal pain. Noted static disease evaluation. History of non small cell lung cancer. EXAM: CT ABDOMEN AND PELVIS WITH  CONTRAST TECHNIQUE: Multidetector CT imaging of the abdomen and pelvis was performed using the standard protocol following bolus administration of intravenous contrast. RADIATION DOSE REDUCTION: This exam was performed according to the departmental dose-optimization program which includes automated exposure control, adjustment of the mA and/or kV according to patient size and/or use of iterative reconstruction technique. CONTRAST:  OMNIPAQUE IOHEXOL 300 MG/ML  SOLN COMPARISON:  09/29/2022 FINDINGS: Lower chest: Small left pleural effusion. No confluent opacities in the lower lobes. Hepatobiliary: Low-density lesion on image 25 of series 2 posteriorly in the right hepatic lobe measures up to 2 cm compared to 2.6 cm previously. No additional focal abnormality. Gallbladder grossly unremarkable. No biliary ductal dilatation. Pancreas: No focal abnormality or ductal dilatation. Spleen: No focal abnormality.  Normal size. Adrenals/Urinary Tract: Right adrenal lesion measures up to 1.9 cm, stable since prior study. Left adrenal gland unremarkable. No stones or hydronephrosis. No renal mass. Urinary bladder unremarkable. Stomach/Bowel: Stomach, large and small bowel grossly unremarkable. Vascular/Lymphatic: Aortic atherosclerosis. No evidence of aneurysm or adenopathy. Reproductive: Uterus and adnexa unremarkable.  No mass. Other: Large volume ascites throughout the abdomen and pelvis, increasing since prior study. Musculoskeletal: Diffuse edema throughout the soft tissues compatible with anasarca. No acute or aggressive appearing osseous lesion.  IMPRESSION: Large volume ascites throughout the abdomen and pelvis, increasing since prior study. Small left pleural effusion, stable. Low-density lesion in the posterior right hepatic lobe is stable or slightly decreased in size since prior study, likely benign. Aortic atherosclerosis. Electronically Signed   By: Charlett Nose M.D.   On: 12/19/2022 21:27      ASSESSMENT/PLAN:  This is a very pleasant 58 year old African-American female diagnosed with stage IV (T2 a, N2, M1 C) non-small cell lung cancer, adenocarcinoma.  The patient presented with a right upper lobe lung mass in addition to right hilar and mediastinal lymphadenopathy.  She also has innumerable bilateral pulmonary nodules, metastatic bone, liver, and adrenal lesions.  She was diagnosed in August 2023.  She is positive for K-ras G12 C mutation and her PD-L1 expression is 89%     The patient completed  palliative radiotherapy to the metastatic bone lesions at L4 and the sacrum under the care of Dr. Mitzi Hansen.  The last day radiation was 10/22/21.    The patient was undergoing palliative systemic chemotherapy with carboplatin for an AUC of 5, Alimta 500 mg per metered square, Keytruda 200 mg IV every 3 weeks.  She is status post 19 cycles.  Starting from cycle #5 the patient started maintenance Alimta and Keytruda.  Starting from cycle #10, due to the patient's concern with quality of life versus quantity of life, her dose of chemotherapy was slightly reduced to 400 mg/m. She discontinued her treatment in September 2024 due to side effects and quality of life.   The patient was seen with Dr. [name] today to get back on track with her appointments.  The patient needs a CT scan of the chest to restage her disease.  I have placed an order to be performed the next 2 weeks.  Will then see her a week later to review the results.  She is expected to have a paracentesis on 01/03/2023.  It looks like this is going to be tested.   I let the patient know that it is important to always have a follow-up appointment to ensure she does not get lost to follow-up.  She was advised to always call us if she does not have a follow-up appointment scheduled within 1 week for last being seen  She is having some worsening anemia.  Her iron studies are pending today.  We will call her if she requires any IV iron.   She is wondering if she should be taking B12.  I let her know she can pick this up over-the-counter.  The patient was advised to call immediately if she has any concerning symptoms in the interval. The patient voices understanding of current disease status and treatment options and is in agreement with the current care plan. All questions were answered. The patient knows to call the clinic with any problems, questions or concerns. We can certainly see the patient much sooner if necessary     Orders Placed This Encounter  Procedures   CT Chest W Contrast    Standing Status:   Future    Expected Date:   01/14/2023    Expiration Date:   12/31/2023    If indicated for the ordered procedure, I authorize the administration of contrast media per Radiology protocol:   Yes    Does the patient have a contrast media/X-ray dye allergy?:   No    Is patient pregnant?:   No    Preferred imaging location?:   [facility]  Elmina Hendel L Nithila Sumners, PA-C 12/31/22  ADDENDUM: Hematology/Oncology Attending: I had a face-to-face encounter with the patient today.  I reviewed her records, lab and recommended her care plan.  This is a very pleasant 58 years old African-American female with a stage IV non-small cell lung cancer, adenocarcinoma diagnosed in August 2023 with positive KRAS G12C mutation and PD-L1 expression of 89%.  The patient is status post palliative radiotherapy to the painful metastatic bone lesions at L4 and sacrum.  She then started systemic chemotherapy initially with carboplatin, Alimta and Keytruda for 4 cycles and starting from cycle #5 she was on maintenance treatment with Alimta and Keytruda every 3 weeks status post 19 cycles.  The patient discontinued her treatment in August 2024 based on her request and to take care of some dental issues.  She was lost to follow-up since that time.  She continued to receive her pain medication by the palliative care team. I recommend for the  patient to have repeat CT scan of the chest, abdomen and pelvis to be performed in the next 1-2 weeks with follow-up visit after the scan. She had evidence of ascites recently and is scheduled for a paracentesis soon.  The fluid will be sent for cytologic evaluation. For the anemia she will continue with vitamin B12 and oral iron supplements. The patient was advised to call immediately if she has any other concerning symptoms in the interval. The total time spent in the appointment was 30 minutes. Disclaimer: This note was dictated with voice recognition software. Similar sounding words can inadvertently be transcribed and may be missed upon review. Lajuana Matte, MD

## 2023-01-01 LAB — T4: T4, Total: 8.3 ug/dL (ref 4.5–12.0)

## 2023-01-03 ENCOUNTER — Ambulatory Visit (HOSPITAL_COMMUNITY)
Admission: RE | Admit: 2023-01-03 | Discharge: 2023-01-03 | Disposition: A | Discharge status: Home / Self Care | Payer: Medicaid Other | Source: Ambulatory Visit | Attending: Family Medicine | Admitting: Family Medicine

## 2023-01-03 ENCOUNTER — Encounter: Payer: Self-pay | Admitting: Family Medicine

## 2023-01-03 DIAGNOSIS — R188 Other ascites: Secondary | ICD-10-CM | POA: Diagnosis not present

## 2023-01-03 DIAGNOSIS — C3491 Malignant neoplasm of unspecified part of right bronchus or lung: Secondary | ICD-10-CM | POA: Insufficient documentation

## 2023-01-03 LAB — BODY FLUID CELL COUNT WITH DIFFERENTIAL
Eos, Fluid: 1 %
Lymphs, Fluid: 18 %
Monocyte-Macrophage-Serous Fluid: 80 % (ref 50–90)
Neutrophil Count, Fluid: 1 % (ref 0–25)
Total Nucleated Cell Count, Fluid: 282 uL (ref 0–1000)

## 2023-01-03 LAB — GLUCOSE, PLEURAL OR PERITONEAL FLUID: Glucose, Fluid: 84 mg/dL

## 2023-01-03 LAB — LACTATE DEHYDROGENASE, PLEURAL OR PERITONEAL FLUID: LD, Fluid: 114 U/L — ABNORMAL HIGH (ref 3–23)

## 2023-01-03 MED ORDER — LIDOCAINE HCL 1 % IJ SOLN
INTRAMUSCULAR | Status: AC
  Filled 2023-01-03: qty 20

## 2023-01-03 NOTE — Procedures (Signed)
PROCEDURE SUMMARY:  Successful US guided paracentesis from right abdomen.  Yielded 5L of cloudy yellow fluid.  No immediate complications.  Pt tolerated well.   Specimen was sent for labs.  EBL < 5mL  Anell Barr Xzander Gilham PA-C 01/03/2023 3:33 PM

## 2023-01-04 LAB — TRIGLYCERIDES, BODY FLUIDS: Triglycerides, Fluid: 1031 mg/dL

## 2023-01-07 ENCOUNTER — Ambulatory Visit (HOSPITAL_COMMUNITY)
Admission: RE | Admit: 2023-01-07 | Discharge: 2023-01-07 | Disposition: A | Discharge status: Home / Self Care | Payer: Medicaid Other | Source: Ambulatory Visit | Attending: Physician Assistant | Admitting: Physician Assistant

## 2023-01-07 DIAGNOSIS — I700 Atherosclerosis of aorta: Secondary | ICD-10-CM | POA: Diagnosis not present

## 2023-01-07 DIAGNOSIS — J90 Pleural effusion, not elsewhere classified: Secondary | ICD-10-CM | POA: Diagnosis not present

## 2023-01-07 DIAGNOSIS — C3491 Malignant neoplasm of unspecified part of right bronchus or lung: Secondary | ICD-10-CM | POA: Insufficient documentation

## 2023-01-07 DIAGNOSIS — C3411 Malignant neoplasm of upper lobe, right bronchus or lung: Secondary | ICD-10-CM | POA: Diagnosis not present

## 2023-01-07 DIAGNOSIS — J432 Centrilobular emphysema: Secondary | ICD-10-CM | POA: Diagnosis not present

## 2023-01-07 LAB — BODY FLUID CULTURE W GRAM STAIN
Culture: NO GROWTH
Gram Stain: NONE SEEN

## 2023-01-07 LAB — CYTOLOGY - NON PAP

## 2023-01-07 MED ORDER — IOHEXOL 300 MG/ML  SOLN
75.0000 mL | Freq: Once | INTRAMUSCULAR | Status: AC | PRN
  Administered 2023-01-07: 75 mL via INTRAVENOUS

## 2023-01-11 LAB — LIPASE, FLUID: Lipase-Fluid: 10 U/L

## 2023-01-13 ENCOUNTER — Telehealth: Payer: Self-pay | Admitting: Internal Medicine

## 2023-01-13 NOTE — Telephone Encounter (Signed)
 Patient is aware of scheduled appointment times/dates

## 2023-01-14 ENCOUNTER — Encounter: Payer: Self-pay | Admitting: Student

## 2023-01-14 ENCOUNTER — Ambulatory Visit: Payer: Medicaid Other | Admitting: Student

## 2023-01-14 VITALS — BP 138/87 | HR 79 | Wt 181.6 lb

## 2023-01-14 DIAGNOSIS — C3491 Malignant neoplasm of unspecified part of right bronchus or lung: Secondary | ICD-10-CM

## 2023-01-14 DIAGNOSIS — R140 Abdominal distension (gaseous): Secondary | ICD-10-CM

## 2023-01-14 MED ORDER — FUROSEMIDE 20 MG PO TABS: 20.0000 mg | ORAL_TABLET | Freq: Every day | ORAL | Status: DC

## 2023-01-14 MED ORDER — SPIRONOLACTONE 50 MG PO TABS: 50.0000 mg | ORAL_TABLET | Freq: Every day | ORAL | 0 refills | Status: DC

## 2023-01-14 NOTE — Assessment & Plan Note (Addendum)
 Recurrence of abdominal ascites despite 5 L removal at last paracentesis.  Her blood pressure was higher at this visit therefore I have restarted Lasix  at 20 mg and added spironolactone  50 mg with goal of treating her ascites. Scheduled for paracentesis January 9.  Advised to monitor blood pressure at home.  Should this continue to be stable, recommend increasing this regimen at next visit and obtaining lab work for hyperkalemia.

## 2023-01-14 NOTE — Progress Notes (Signed)
  SUBJECTIVE:   CHIEF COMPLAINT / HPI:   Abdominal distention: Received paracentesis on 12/23 with 5 L output.  Since last clinic appointment on 12/17, her Lasix  was discontinued.  She was seen by oncology on 12/20 and advised to have a repeat CT scan which was performed on 12/27, formal read still pending.  She is status post palliative radiotherapy. She is followed by palliative medicine.  Abdominal distention improved with last paracentesis however her abdomen was back to being distended on 12/27 and she notes that this is getting worse.  Denies fever, shortness of breath.  She has been constipated for the last couple days.  She states that she was notified by insurance that she could not have more than 1 outpatient paracentesis. Oncology appointment on 01/19/23. She takes oxycodone  10mg  q4h prn and oxycodone  ER 13.5mg  q12h. she does want to be comfortable and is open to transitioning to hospice.  PERTINENT  PMH / PSH: Adenocarcinoma of lung (stage IV), T2DM, HTN  OBJECTIVE:  BP 138/87   Pulse 79   Wt 181 lb 9.6 oz (82.4 kg)   LMP 06/12/2016 (Approximate)   SpO2 97%   BMI 28.44 kg/m  General: Chronically ill-appearing, NAD CV: RRR, no murmurs auscultated Pulm: CTAB, normal WOB Abdomen: Distended and diffusely tender, induration of lower abdomen, positive fluid wave Extremities: 3+ pitting edema of BLEs slightly above the knee  ASSESSMENT/PLAN:   Assessment & Plan Adenocarcinoma of right lung, stage 4 (HCC) Advised patient to discuss with palliative medicine transition to hospice given she wants to pursue comfort and will not be receiving further medical intervention of her malignancy. Abdominal distension Recurrence of abdominal ascites despite 5 L removal at last paracentesis.  Her blood pressure was higher at this visit therefore I have restarted Lasix  at 20 mg and added spironolactone  50 mg with goal of treating her ascites. Scheduled for paracentesis January 9.  Advised to monitor  blood pressure at home.  Should this continue to be stable, recommend increasing this regimen at next visit and obtaining lab work for hyperkalemia. Return in about 6 days (around 01/20/2023) for Ascites follow-up. Kieth Johnson, DO 01/14/2023, 5:08 PM PGY-3, [facility]

## 2023-01-14 NOTE — Patient Instructions (Addendum)
 It was great to see you today! Thank you for choosing [facility] for your primary care.  Today we addressed: Please call palliative medicine to transition to hospice Start Lasix  20 mg, spironolactone  50 mg daily.  Please check your blood pressure regularly to make sure it is not dropping too low.  We may increase these dosages at your next visit.  We will need to check lab work at your next visit to make sure potassium is okay. I have placed order for ultrasound paracentesis for next week.  We will assist in getting that scheduled for you. Please follow-up next week approximately 01/20/2023.  If you haven't already, sign up for My Chart to have easy access to your labs results, and communication with your primary care physician.  Please arrive 15 minutes before your appointment to ensure smooth check in process.  We appreciate your efforts in making this happen.  Thank you for allowing me to participate in your care, Kieth Johnson, DO 01/14/2023, 2:44 PM PGY-3, [facility]

## 2023-01-14 NOTE — Assessment & Plan Note (Signed)
 Advised patient to discuss with palliative medicine transition to hospice given she wants to pursue comfort and will not be receiving further medical intervention of her malignancy.

## 2023-01-17 ENCOUNTER — Telehealth: Payer: Self-pay | Admitting: Internal Medicine

## 2023-01-18 ENCOUNTER — Other Ambulatory Visit: Payer: Self-pay | Admitting: Medical Oncology

## 2023-01-18 DIAGNOSIS — C3491 Malignant neoplasm of unspecified part of right bronchus or lung: Secondary | ICD-10-CM

## 2023-01-18 NOTE — Progress Notes (Signed)
 Palliative Medicine [facility]  Telephone:([phone]) [phone] [phone]) [phone]   Name: Cassidy [name] Date: 01/18/2023 MRN: [id number]  DOB: 30-Oct-1965  Patient Care Team: Nicholas Bar, MD as PCP - General (Family Medicine) O'Neal, Darryle Ned, MD as PCP - Cardiology (Cardiology) Ebbie Cough, MD as Consulting Physician (General Surgery) Gordy Channing LABOR, RN as Registered Nurse [facility]   INTERVAL HISTORY: Cassidy [name] is a 58 y.o. 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  Palliative ask to see for symptom management and goals of care.   SOCIAL HISTORY:    Cassidy [name] reports that she has quit smoking. Her smoking use included cigarettes. She started smoking about 44 years ago. She has a 22 pack-year smoking history. She has never used smokeless tobacco. She reports that she does not currently use drugs. She reports that she does not drink alcohol .  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  s/p PCTA to dRCA and ostial RPAV-PLA vessel + PDA branch (05/2012)  b. [place]  s/p DES to mLAD - Resolute DES 3.0 x 22 (3.26mm -->3.3 mm)   Bipolar depression (HCC)    Breast abscess    a. right side.    COPD (chronic obstructive pulmonary disease) (HCC)    Depression    Diabetic peripheral neuropathy (HCC)    Fibromyalgia    Genital warts    GERD (gastroesophageal reflux disease)    History of hiatal hernia    HLD (hyperlipidemia)    Hypertension    Lung cancer (HCC)    Pain in limb    a. LE  VENOUS DUPLEX, 02/05/2009 - no evidence of deep vein thrombosis, Baker's cyst   Pneumonia    PTSD (post-traumatic stress disorder)    Sickle cell trait (HCC)    Tobacco abuse    Tooth caries    Type II diabetes mellitus (HCC)     ALLERGIES:  is allergic to amoxil  [amoxicillin ], chantix [varenicline], suboxone [buprenorphine hcl-naloxone hcl], and emend [fosaprepitant  dimeglumine].  MEDICATIONS:  Current Outpatient Medications  Medication Sig Dispense Refill   aspirin  EC 81 MG tablet Take 81 mg by mouth daily.     atorvastatin  (LIPITOR) 40 MG tablet TAKE 1 TABLET (40 MG TOTAL) BY MOUTH DAILY. 90 tablet 3   busPIRone  (BUSPAR ) 15 MG tablet Take 0.5 tablets (7.5 mg total) by mouth 2 (two) times daily. 60 tablet 3   Carboxymethylcellul-Glycerin (LUBRICATING EYE DROPS OP) Place 1 drop into both eyes daily as needed (dry eyes).     carvedilol  (COREG ) 12.5 MG tablet Take 1 tablet (12.5 mg total) by mouth 2 (two) times daily with a meal. 180 tablet 1   diclofenac  Sodium (VOLTAREN ) 1 % GEL Apply 1 application topically 3 times daily as needed for pain 200 g 1   docusate sodium  (COLACE) 100 MG capsule Take 1 capsule (100 mg total) by mouth daily. 10 capsule 0   DULoxetine  (CYMBALTA ) 30 MG capsule Take 1 capsule (30 mg total) by mouth daily. 30 capsule 5   ferrous sulfate  325 (65 FE) MG tablet Take  1 tablet (325 mg total) by mouth daily with breakfast. 30 tablet 2   fluticasone  (FLONASE ) 50 MCG/ACT nasal spray Place 1 spray into both nostrils daily as needed (congestion). 16 g 1   folic acid  (FOLVITE ) 1 MG tablet TAKE 1 TABLET (1 MG TOTAL) BY MOUTH DAILY. 30 tablet 3   furosemide  (LASIX ) 20 MG tablet Take 1 tablet (20 mg total) by mouth daily.     glucose blood (ACCU-CHEK AVIVA PLUS) test strip Use as instructed 50 each 1   hyoscyamine  (LEVSIN  SL) 0.125 MG SL tablet Place 1 tablet (0.125 mg total) under the tongue every 6 (six) hours as needed. For abdominal cramping 30 tablet 0   insulin  glargine  (LANTUS ) 100 UNIT/ML Solostar Pen Inject 24 Units into the skin every morning. 3 mL 11   Insulin  Pen Needle 32G X 4 MM MISC 1 Needle by Does not apply route in the morning and at bedtime. 120 each 2   ipratropium (ATROVENT  HFA) 17 MCG/ACT inhaler Inhale 2 puffs into the lungs every 4 (four) hours as needed for wheezing (COPD).     lidocaine -prilocaine  (EMLA ) cream APPLY 1 APPLICATION TOPICALLY AS NEEDED. 30 g 2   LORazepam  (ATIVAN ) 0.5 MG tablet Take 1 tablet (0.5 mg total) by mouth every 8 (eight) hours as needed for anxiety (nausea). 30 tablet 0   metoCLOPramide  (REGLAN ) 10 MG tablet Take 1 tablet (10 mg total) by mouth every 8 (eight) hours as needed for nausea. 60 tablet 2   mometasone -formoterol  (DULERA ) 100-5 MCG/ACT AERO Inhale 2 puffs into the lungs in the morning and at bedtime. 13 g 3   nicotine  polacrilex (NICORETTE ) 2 MG gum Take 1 each (2 mg total) by mouth as needed for smoking cessation. 100 tablet 0   ondansetron  (ZOFRAN -ODT) 8 MG disintegrating tablet Take 1 tablet (8 mg total) by mouth every 8 (eight) hours as needed for nausea or vomiting. 30 tablet 2   oxyCODONE  ER (XTAMPZA  ER) 13.5 MG C12A Take 13.5 mg by mouth every 12 (twelve) hours. 60 capsule 0   Oxycodone  HCl 10 MG TABS Take 1 tablet (10 mg total) by mouth every 4 (four) hours as needed. 90 tablet 0   pantoprazole  (PROTONIX ) 40 MG tablet Take 1 tablet (40 mg total) by mouth 2 (two) times daily. 60 tablet 5   polyethylene glycol powder (GAVILAX) 17 GM/SCOOP powder Take 17 g by mouth daily. 850 g 1   prochlorperazine  (COMPAZINE ) 10 MG tablet TAKE 1 TABLET (10 MG TOTAL) BY MOUTH EVERY 6 (SIX) HOURS AS NEEDED FOR NAUSEA OR VOMITING. 30 tablet 0   spironolactone  (ALDACTONE ) 50 MG tablet Take 1 tablet (50 mg total) by mouth at bedtime. 90 tablet 0   valACYclovir  (VALTREX ) 1000 MG tablet Take for 3 days prn each outbreak. (Patient taking differently: Take 1,000 mg by mouth daily. Take for 3 days prn each outbreak.) 30 tablet 11    No current facility-administered medications for this visit.    VITAL SIGNS: LMP 06/12/2016 (Approximate)  There were no vitals filed for this visit.  Estimated body mass index is 28.44 kg/m as calculated from the following:   Height as of 12/31/22: 5' 7 (1.702 m).   Weight as of 01/14/23: 181 lb 9.6 oz (82.4 kg).   PERFORMANCE STATUS (ECOG) : 1 - Symptomatic but completely ambulatory  Assessment NAD, thin Diminished bilaterally, dyspnea with exertion RRR Abdomen firm, largely distended, tender Bilateral lower extremity pitting edema AAO x3  IMPRESSION: .   Mrs. Mathew  presents to clinic today for symptom management follow-up and ongoing goals of care discussions. She has made the decision to take a break from her chemotherapy treatments to allow for improved quality of life and focus on her health as a whole. She has been able to spend some time with her family with decrease in symptoms which she related to her chemotherapy/immunotherapy. Denies nausea, vomiting, or diarrhea. Endorses increase in fatigue. Constipation controlled with daily stool softeners.   Jem has new concerns of abdominal ascites over the past month. She presents with increasing abdominal pain and discomfort due to ascites. She reports that the fluid accumulation is causing shortness of breath, decreased appetite, and urinary issues, suggesting that the fluid may be exerting pressure on surrounding organs. This is the second occurrence of ascites, with the first episode having been managed with a paracentesis ordered by her PCP. At that time 5L was taken off.  However, the recurrence was quicker than anticipated and appears to be more severe, causing significant back pain. She is scheduled for additional paracentesis on 1/9. I provided education on possible need for abdominal paracentesis to assist in managing her recurrent ascites.  Education provided on the risk and benefits.   The patient has a history of  diabetes and is currently managing her fluid retention with Lasix  and a newly added medication, spironolactone . She also takes potassium supplements. Despite her physical limitations, the patient attempts to maintain some level of physical activity at home using elastic bands for resistance exercises.  Ms. Venturini is emotional expressing known changes in her body. Her appetite is minimal and feelings of early satiety. Some days her appetite is better than others. She expresses concern about her decreasing weight and physical frailty, noting visible changes in her body such as skin sagging and visible ribs. Is tearful seeing her body waste away. Her abdomen is largely distended and firm. We discussed her appetite, ways to increase protein intake.   Her pain has become more severe and intense. Reports significant abdominal and back pain. Pain is constant despite regimen. Does decrease however it seems over the past 2-3 weeks pain worsens quicker and she becomes uncomfortable within 3-4 hours. Taking medication knows the edge off however at times she is unable to move or do things around the home due to her discomfort. We discussed her pain regimen at length. [place] is taking Xtampza  9mg  every 12 hours and oxycodone  10mg  every 4-6 hours as needed. Education provided on increasing hydrocodone  to 1-2 tablets every 4 hours as needed. Will continue with Xtampza  with plans to transition to fentanyl  patch if pain remains uncontrolled.   I empathetically approached discussions regarding goals of care, quality of life, and healthcare limitations. Although patient's recent scans do not show significant cancer progression as discussed with Cassie, PA Ms. Ausmus's quality of life is clearly poor and symptom burden is high. She is realistic in her understanding of her current cancer state in addition to other co-morbidities. Cheyrl is clear in her expressed wishes that her quality of life is most important to her and not  being in a suffering state. She is tearful sharing she knows that time is limited. The patient has began preparing her son for this however he is not accepting at this time as the only child. Murlean has spoken with this girlfriend who acknowledges her decline and is assisting in preparing patient's son for the inevitable. Emotional support provided.   Keri states she has no desire for her life to  be prolonged if there is no quality. We discussed her full code status with consideration of her current illness. She shares desires for a natural death and her life to not be prolonged by life-sustaining measures including CPR or intubation. Education provided on DNR/DNI. She expressed understanding and agreement with the concept due to her deteriorating health status. She also would not want her family to struggle with such decisions. MOST form introduced and education provided on documents. The patient would like to take form home and review hoping to gain support from her son. She is interested in completing at follow-up visit. Would not wish for artificial nutrition.   Education provided on hospice's goals and philosophy of care. We discussed what care would look like in the home as this would be her desire. She understands this support will be needed in the future however at this time she is not read to transition her care to focus on end-of-life. States she has to be at peace specifically knowing that her son is understanding and prepared. She is also not ready to discontinue support at the cancer center or from her providers. Masaye would look like to continue taking things one day at at time for as long as she can.  The patient has also been experiencing financial difficulties, considering selling her car due to infrequent use and financial strain. Despite these challenges, the patient is proactive in managing her health, regularly checking her medical charts and maintaining communication with her  healthcare team.  Support has been provided and all questions answered. Patient knows to contact office as needed.   We discussed the importance of continued conversation with family and their medical providers regarding overall plan of care and treatment options, ensuring decisions are within the context of the patients values and GOCs.  Assessment and Plan  Cancer with Ascites Recurrent ascites causing discomfort, shortness of breath, and back pain. Discussed the benefits and risks of a Pluerx catheter for home management of ascites as this may be needed in the future pending rapidly reoccurring fluid accumulation.  -Drain ascites tomorrow and schedule subsequent drains as needed. -Consider Pluerx catheter placement after the third drain.  Pain Management Increased pain, currently on Xtampza  and oxycodone . Discussed the possibility of switching to a fentanyl  patch due to increased pain and potential insurance issues with Xtampza . -Continue Xtampza  as prescribed.  -Plan to switch to fentanyl  patch in the future if pain remains uncontrolled.  -Send new prescription for oxycodone  1-2 tablets every 4 hours as needed for pain.  Depression Reports of increased depression and emotional distress. Currently on Cymbalta  and Buspar . -Continue current antidepressant regimen. -Send new prescription for Ativan  for anxiety.  Edema Reports of persistent leg swelling despite elevation and Lasix  use. -Continue Lasix  and newly added spironolactone . -Continue home exercises with elastic bands.  End of Life Care Discussed the benefits of hospice care and the implications of a Do Not Resuscitate (DNR) order. -Consider hospice care for additional support at home. -Discuss DNR order with patient's brother, who will be the guardian. -Patient has advanced directives with son and brother listed as HCPOA.   Follow-up in 1 week on 01/26/2023.  Patient expressed understanding and was in agreement with this  plan. She also understands that She can call the clinic at any time with any questions, concerns, or complaints.    Any controlled substances utilized were prescribed in the context of palliative care. PDMP has been reviewed.    Visit consisted of counseling and education dealing  with the complex and emotionally intense issues of symptom management and palliative care in the setting of serious and potentially life-threatening illness.  Levon Borer, AGPCNP-BC  Palliative Medicine Team/[facility]

## 2023-01-19 ENCOUNTER — Inpatient Hospital Stay: Payer: Medicaid Other | Admitting: Physician Assistant

## 2023-01-19 ENCOUNTER — Encounter: Payer: Self-pay | Admitting: Nurse Practitioner

## 2023-01-19 ENCOUNTER — Inpatient Hospital Stay: Payer: Medicaid Other | Attending: Internal Medicine | Admitting: Nurse Practitioner

## 2023-01-19 ENCOUNTER — Telehealth: Payer: Self-pay | Admitting: Physician Assistant

## 2023-01-19 ENCOUNTER — Inpatient Hospital Stay: Payer: Medicaid Other

## 2023-01-19 DIAGNOSIS — Z8701 Personal history of pneumonia (recurrent): Secondary | ICD-10-CM | POA: Insufficient documentation

## 2023-01-19 DIAGNOSIS — R110 Nausea: Secondary | ICD-10-CM | POA: Diagnosis not present

## 2023-01-19 DIAGNOSIS — J432 Centrilobular emphysema: Secondary | ICD-10-CM | POA: Insufficient documentation

## 2023-01-19 DIAGNOSIS — C7972 Secondary malignant neoplasm of left adrenal gland: Secondary | ICD-10-CM | POA: Diagnosis not present

## 2023-01-19 DIAGNOSIS — Z880 Allergy status to penicillin: Secondary | ICD-10-CM | POA: Diagnosis not present

## 2023-01-19 DIAGNOSIS — G893 Neoplasm related pain (acute) (chronic): Secondary | ICD-10-CM

## 2023-01-19 DIAGNOSIS — M7989 Other specified soft tissue disorders: Secondary | ICD-10-CM | POA: Diagnosis not present

## 2023-01-19 DIAGNOSIS — I700 Atherosclerosis of aorta: Secondary | ICD-10-CM | POA: Diagnosis not present

## 2023-01-19 DIAGNOSIS — C3491 Malignant neoplasm of unspecified part of right bronchus or lung: Secondary | ICD-10-CM | POA: Diagnosis not present

## 2023-01-19 DIAGNOSIS — K219 Gastro-esophageal reflux disease without esophagitis: Secondary | ICD-10-CM | POA: Diagnosis not present

## 2023-01-19 DIAGNOSIS — K5900 Constipation, unspecified: Secondary | ICD-10-CM | POA: Insufficient documentation

## 2023-01-19 DIAGNOSIS — I10 Essential (primary) hypertension: Secondary | ICD-10-CM | POA: Diagnosis not present

## 2023-01-19 DIAGNOSIS — Z79899 Other long term (current) drug therapy: Secondary | ICD-10-CM | POA: Diagnosis not present

## 2023-01-19 DIAGNOSIS — Z9226 Personal history of immune checkpoint inhibitor therapy: Secondary | ICD-10-CM | POA: Insufficient documentation

## 2023-01-19 DIAGNOSIS — C7971 Secondary malignant neoplasm of right adrenal gland: Secondary | ICD-10-CM | POA: Insufficient documentation

## 2023-01-19 DIAGNOSIS — R634 Abnormal weight loss: Secondary | ICD-10-CM | POA: Diagnosis not present

## 2023-01-19 DIAGNOSIS — E785 Hyperlipidemia, unspecified: Secondary | ICD-10-CM | POA: Diagnosis not present

## 2023-01-19 DIAGNOSIS — E119 Type 2 diabetes mellitus without complications: Secondary | ICD-10-CM | POA: Insufficient documentation

## 2023-01-19 DIAGNOSIS — Z8774 Personal history of (corrected) congenital malformations of heart and circulatory system: Secondary | ICD-10-CM | POA: Diagnosis not present

## 2023-01-19 DIAGNOSIS — Z923 Personal history of irradiation: Secondary | ICD-10-CM | POA: Insufficient documentation

## 2023-01-19 DIAGNOSIS — C787 Secondary malignant neoplasm of liver and intrahepatic bile duct: Secondary | ICD-10-CM | POA: Insufficient documentation

## 2023-01-19 DIAGNOSIS — R630 Anorexia: Secondary | ICD-10-CM | POA: Diagnosis not present

## 2023-01-19 DIAGNOSIS — C7951 Secondary malignant neoplasm of bone: Secondary | ICD-10-CM | POA: Diagnosis not present

## 2023-01-19 DIAGNOSIS — R188 Other ascites: Secondary | ICD-10-CM | POA: Insufficient documentation

## 2023-01-19 DIAGNOSIS — Z9089 Acquired absence of other organs: Secondary | ICD-10-CM | POA: Insufficient documentation

## 2023-01-19 DIAGNOSIS — Z515 Encounter for palliative care: Secondary | ICD-10-CM

## 2023-01-19 DIAGNOSIS — M533 Sacrococcygeal disorders, not elsewhere classified: Secondary | ICD-10-CM | POA: Diagnosis not present

## 2023-01-19 DIAGNOSIS — Z95828 Presence of other vascular implants and grafts: Secondary | ICD-10-CM

## 2023-01-19 DIAGNOSIS — C3411 Malignant neoplasm of upper lobe, right bronchus or lung: Secondary | ICD-10-CM | POA: Insufficient documentation

## 2023-01-19 LAB — CMP (CANCER CENTER ONLY)
ALT: <5 U/L (ref 0–44)
AST: 10 U/L — ABNORMAL LOW (ref 15–41)
Albumin: 3 g/dL — ABNORMAL LOW (ref 3.5–5.0)
Alkaline Phosphatase: 71 U/L (ref 38–126)
Anion gap: 6 (ref 5–15)
BUN: 15 mg/dL (ref 6–20)
CO2: 26 mmol/L (ref 22–32)
Calcium: 8.4 mg/dL — ABNORMAL LOW (ref 8.9–10.3)
Chloride: 105 mmol/L (ref 98–111)
Creatinine: 1.04 mg/dL — ABNORMAL HIGH (ref 0.44–1.00)
GFR, Estimated: >60 mL/min (ref >60)
Glucose, Bld: 99 mg/dL (ref 70–99)
Potassium: 3.7 mmol/L (ref 3.5–5.1)
Sodium: 137 mmol/L (ref 135–145)
Total Bilirubin: 0.6 mg/dL (ref 0.0–1.2)
Total Protein: 6.5 g/dL (ref 6.5–8.1)

## 2023-01-19 LAB — CBC WITH DIFFERENTIAL (CANCER CENTER ONLY)
Abs Immature Granulocytes: 0.02 10*3/uL (ref 0.00–0.07)
Basophils Absolute: 0 10*3/uL (ref 0.0–0.1)
Basophils Relative: 0 %
Eosinophils Absolute: 0.1 10*3/uL (ref 0.0–0.5)
Eosinophils Relative: 2 %
HCT: 32 % — ABNORMAL LOW (ref 36.0–46.0)
Hemoglobin: 11 g/dL — ABNORMAL LOW (ref 12.0–15.0)
Immature Granulocytes: 0 %
Lymphocytes Relative: 15 %
Lymphs Abs: 0.8 10*3/uL (ref 0.7–4.0)
MCH: 31.4 pg (ref 26.0–34.0)
MCHC: 34.4 g/dL (ref 30.0–36.0)
MCV: 91.4 fL (ref 80.0–100.0)
Monocytes Absolute: 0.6 10*3/uL (ref 0.1–1.0)
Monocytes Relative: 11 %
Neutro Abs: 3.8 10*3/uL (ref 1.7–7.7)
Neutrophils Relative %: 72 %
Platelet Count: 334 10*3/uL (ref 150–400)
RBC: 3.5 MIL/uL — ABNORMAL LOW (ref 3.87–5.11)
RDW: 14.2 % (ref 11.5–15.5)
WBC Count: 5.3 10*3/uL (ref 4.0–10.5)
nRBC: 0 % (ref 0.0–0.2)

## 2023-01-19 LAB — TSH: TSH: 8.107 u[IU]/mL — ABNORMAL HIGH (ref 0.350–4.500)

## 2023-01-19 MED ORDER — LORAZEPAM 0.5 MG PO TABS: 0.5000 mg | ORAL_TABLET | Freq: Three times a day (TID) | ORAL | 0 refills | Status: DC | PRN

## 2023-01-19 MED ORDER — OXYCODONE HCL 10 MG PO TABS: 10.0000 mg | ORAL_TABLET | ORAL | 0 refills | Status: DC | PRN

## 2023-01-19 MED ORDER — SODIUM CHLORIDE 0.9% FLUSH
10.0000 mL | Freq: Once | INTRAVENOUS | Status: AC
  Administered 2023-01-19: 10 mL

## 2023-01-19 MED ORDER — HEPARIN SOD (PORK) LOCK FLUSH 100 UNIT/ML IV SOLN
500.0000 [IU] | Freq: Once | INTRAVENOUS | Status: AC
  Administered 2023-01-19: 500 [IU]

## 2023-01-19 NOTE — Telephone Encounter (Signed)
 The patient was added on to the schedule today after her palliative appointment so she does not have to come back on 01/24/23. However, she is no longer in the exam room and did not check in with any staff prior to leaving. I called her to see if she is still in the clinic. Unable to reach her. I left a voicemail and asked that she call us  back and let us  know if she needed to leave before being seen. If so, we will keep her follow up as scheduled on Monday.

## 2023-01-19 NOTE — Progress Notes (Signed)
 error

## 2023-01-20 ENCOUNTER — Ambulatory Visit (HOSPITAL_COMMUNITY)
Admission: RE | Admit: 2023-01-20 | Discharge: 2023-01-20 | Disposition: A | Discharge status: Home / Self Care | Payer: Medicaid Other | Source: Ambulatory Visit | Attending: Family Medicine | Admitting: Family Medicine

## 2023-01-20 DIAGNOSIS — R188 Other ascites: Secondary | ICD-10-CM | POA: Insufficient documentation

## 2023-01-20 DIAGNOSIS — R140 Abdominal distension (gaseous): Secondary | ICD-10-CM

## 2023-01-20 LAB — HM DIABETES EYE EXAM

## 2023-01-20 LAB — T4: T4, Total: 7.6 ug/dL (ref 4.5–12.0)

## 2023-01-20 MED ORDER — LIDOCAINE HCL 1 % IJ SOLN
INTRAMUSCULAR | Status: AC
  Filled 2023-01-20: qty 20

## 2023-01-20 NOTE — Procedures (Signed)
 Ultrasound-guided  therapeutic paracentesis performed yielding 5 liters of chylous/milky colored fluid. No immediate complications. EBL none.

## 2023-01-21 NOTE — Progress Notes (Signed)
 [facility] OFFICE PROGRESS NOTE  Nicholas Bar, MD [address] [place] [address]  DIAGNOSIS: Stage IV (T2a, N2, M1c) non-small cell lung cancer, adenocarcinoma presented with right upper lobe lung mass in addition to right hilar and mediastinal lymphadenopathy and innumerable bilateral pulmonary nodules as well as liver and bone metastasis.  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    Detected Alteration(s) / Biomarker(s) Associated FDA-approved therapies  Clinical Trial Availability      % cfDNA or Amplification   KRAS G12C  approved by FDA Adagrasib, Sotorasib Yes   5.3%   IDH1 R132L  approved in other indication Ivosidenib, Olutasidenib Yes      1.9%   PD-L1 expression 89%   PRIOR THERAPY: 1) Palliative radiation to the painful metastatic bone lesions at L4 and the sacrum under the care of Dr. Dewey. Last day on 10/22/21  2) Systemic chemotherapy with carboplatin  for AUC of 5, Alimta  500 Mg/M2 and Keytruda  200 Mg IV every 3 weeks.  First dose September 02, 2021.  Status post 19 cycles.  Starting from cycle #5, the patient will start maintenance Alimta  and Keytruda  IV every 3 weeks. Starting from cycle #10, reduced the dose of Alimta  to 400 mg/m2 due to intolerance. Last dose on 09/15/22. Discontinued due to patient request  CURRENT THERAPY: Observation   INTERVAL HISTORY: Cassidy [name] 58 y.o. female returns to the clinic today for a follow-up visit. In summary, the patient had not been seen since 10/06/22-12/31/22. She was lost to follow up.   In the interval since being lost to follow up, he had been been struggling with ascites. Her PCP had ordered a CT which from 12/14/2022 that showed large volume ascites throughout the abdomen. She has had a paracentesis x2, the most recent on 01/20/23. Both times yielded 5 L of fluid. The cytology was negative for malignant cells x1. She had diffuse edema throughout the soft tissue compatible with anasarca. The  patient is also having lower extremity swelling. Of note her most recent echocardiogram was on 09/29/2022. Her EF was 60 to 65%. He was found to have some grade 1 diastolic dysfunction and some right ventricular systolic reduced function.  Her PCP restarted her lasix  and spironolactone .    When we saw her last, we arranged for a restaging CT of the chest to assess her malignancy since she had been off treatment.   Denies any fevers.  She was previously receiving B12 injections in the clinic when she was on Alimta .  She reports weight loss. She was not eating as much previously due to bloating from the ascites.  She reports her dyspnea on exertion is about the same as when she was last seen.  She sometimes has a mild cough due to phlegm. Her CT did not show any pneumonia/infection. Denies any chest pain or hemoptysis.  She is on chronic pain management and does have some constipation but she is using a bowel regimen for this. She generally does not have nausea/vomiting. She did have one episode of nausea after something she ate on Sunday. She has anti-emetics if needed.  Denies any headache or visual changes. She is here to review her restaging CT of the chest and discuss the next steps in her care.     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  s/p PCTA to dRCA and ostial RPAV-PLA vessel + PDA branch (05/2012)  b. [place]  s/p DES to mLAD - Resolute DES 3.0 x 22 (3.74mm -->3.3 mm)   Bipolar depression (HCC)    Breast abscess    a. right side.    COPD (chronic obstructive pulmonary disease) (HCC)    Depression    Diabetic peripheral neuropathy (HCC)    Fibromyalgia    Genital warts    GERD (gastroesophageal reflux disease)    History of hiatal hernia    HLD (hyperlipidemia)    Hypertension     Lung cancer (HCC)    Pain in limb    a. LE VENOUS DUPLEX, 02/05/2009 - no evidence of deep vein thrombosis, Baker's cyst   Pneumonia    PTSD (post-traumatic stress disorder)    Sickle cell trait (HCC)    Tobacco abuse    Tooth caries    Type II diabetes mellitus (HCC)     ALLERGIES:  is allergic to amoxil  [amoxicillin ], chantix [varenicline], suboxone [buprenorphine hcl-naloxone hcl], and emend [fosaprepitant  dimeglumine].  MEDICATIONS:  Current Outpatient Medications  Medication Sig Dispense Refill   aspirin  EC 81 MG tablet Take 81 mg by mouth daily.     atorvastatin  (LIPITOR) 40 MG tablet TAKE 1 TABLET (40 MG TOTAL) BY MOUTH DAILY. 90 tablet 3   busPIRone  (BUSPAR ) 15 MG tablet Take 0.5 tablets (7.5 mg total) by mouth 2 (two) times daily. 60 tablet 3   Carboxymethylcellul-Glycerin (LUBRICATING EYE DROPS OP) Place 1 drop into both eyes daily as needed (dry eyes).     carvedilol  (COREG ) 12.5 MG tablet Take 1 tablet (12.5 mg total) by mouth 2 (two) times daily with a meal. 180 tablet 1   diclofenac  Sodium (VOLTAREN ) 1 % GEL Apply 1 application topically 3 times daily as needed for pain 200 g 1   docusate sodium  (COLACE) 100 MG capsule Take 1 capsule (100 mg total) by mouth daily. 10 capsule 0   DULoxetine  (CYMBALTA ) 30 MG capsule Take 1 capsule (30 mg total) by mouth daily. 30 capsule 5   ferrous sulfate  325 (65 FE) MG tablet Take 1 tablet (325 mg total) by mouth daily with breakfast. 30 tablet 2   fluticasone  (FLONASE ) 50 MCG/ACT nasal spray Place 1 spray into both nostrils daily as needed (congestion). 16 g 1   folic acid  (FOLVITE ) 1 MG tablet TAKE 1 TABLET (1 MG TOTAL) BY MOUTH DAILY. 30 tablet 3   furosemide  (LASIX ) 20 MG tablet Take 1 tablet (20 mg total) by mouth daily.     glucose blood (ACCU-CHEK AVIVA PLUS) test strip Use as instructed 50 each 1   hyoscyamine  (LEVSIN  SL) 0.125 MG SL tablet Place 1 tablet (0.125 mg total) under the tongue every 6 (six) hours as needed. For  abdominal cramping 30 tablet 0   insulin  glargine (LANTUS ) 100 UNIT/ML Solostar Pen Inject 24 Units into the skin every morning. 3 mL 11   Insulin  Pen Needle 32G X 4 MM MISC 1 Needle by Does not apply route in the morning and at bedtime. 120 each 2   ipratropium (ATROVENT  HFA) 17 MCG/ACT inhaler Inhale 2 puffs into the lungs every 4 (four) hours as needed for wheezing (COPD).     lidocaine -prilocaine  (EMLA ) cream APPLY 1 APPLICATION TOPICALLY AS NEEDED. 30 g 2   LORazepam  (ATIVAN ) 0.5 MG tablet Take 1 tablet (0.5 mg total) by mouth every 8 (eight)  hours as needed for anxiety (nausea). 30 tablet 0   metoCLOPramide  (REGLAN ) 10 MG tablet Take 1 tablet (10 mg total) by mouth every 8 (eight) hours as needed for nausea. 60 tablet 2   mometasone -formoterol  (DULERA ) 100-5 MCG/ACT AERO Inhale 2 puffs into the lungs in the morning and at bedtime. 13 g 3   nicotine  polacrilex (NICORETTE ) 2 MG gum Take 1 each (2 mg total) by mouth as needed for smoking cessation. 100 tablet 0   ondansetron  (ZOFRAN -ODT) 8 MG disintegrating tablet Take 1 tablet (8 mg total) by mouth every 8 (eight) hours as needed for nausea or vomiting. 30 tablet 2   oxyCODONE  ER (XTAMPZA  ER) 13.5 MG C12A Take 13.5 mg by mouth every 12 (twelve) hours. 60 capsule 0   Oxycodone  HCl 10 MG TABS Take 1-2 tablets (10-20 mg total) by mouth every 4 (four) hours as needed. 90 tablet 0   pantoprazole  (PROTONIX ) 40 MG tablet Take 1 tablet (40 mg total) by mouth 2 (two) times daily. 60 tablet 5   polyethylene glycol powder (GAVILAX) 17 GM/SCOOP powder Take 17 g by mouth daily. 850 g 1   prochlorperazine  (COMPAZINE ) 10 MG tablet TAKE 1 TABLET (10 MG TOTAL) BY MOUTH EVERY 6 (SIX) HOURS AS NEEDED FOR NAUSEA OR VOMITING. 30 tablet 0   spironolactone  (ALDACTONE ) 50 MG tablet Take 1 tablet (50 mg total) by mouth at bedtime. 90 tablet 0   valACYclovir  (VALTREX ) 1000 MG tablet Take for 3 days prn each outbreak. (Patient taking differently: Take 1,000 mg by mouth  daily. Take for 3 days prn each outbreak.) 30 tablet 11   No current facility-administered medications for this visit.    SURGICAL HISTORY:  Past Surgical History:  Procedure Laterality Date   BLADDER SURGERY  1980   TVT   BLADDER SURGERY  2003   TVT   BREAST EXCISIONAL BIOPSY     BREAST SURGERY Right    I&D for multiple abscesses   CARDIAC CATHETERIZATION     CORONARY ARTERY BYPASS GRAFT N/A 06/30/2021   Procedure: CORONARY ARTERY BYPASS GRAFTING (CABG) x3 USING LEFT INTERNAL MAMMARY ARTERY,  RIGHT RADIAL ARTERY AND LEFT GREATER SAPHENOUS VEIN;  Surgeon: Shyrl Linnie KIDD, MD;  Location: [facility] OR;  Service: Open Heart Surgery;  Laterality: N/A;   cutting balloon     ENDOVEIN HARVEST OF GREATER SAPHENOUS VEIN Left 06/30/2021   Procedure: ENDOVEIN HARVEST OF GREATER SAPHENOUS VEIN;  Surgeon: Shyrl Linnie KIDD, MD;  Location: [facility] OR;  Service: Open Heart Surgery;  Laterality: Left;   EYE SURGERY Left    laser surgery   FINE NEEDLE ASPIRATION  08/21/2021   Procedure: FINE NEEDLE ASPIRATION (FNA) LINEAR;  Surgeon: Brenna Adine CROME, DO;  Location: [facility] ENDOSCOPY;  Service: Pulmonary;;   INCISE AND DRAIN ABCESS  ; 04/26/11; 08/13/11   right breast   INCISION AND DRAINAGE ABSCESS Right 05/09/2012   Procedure: INCISION AND DRAINAGE RIGHT BREAST ABSCESS;  Surgeon: Donnice POUR. Belinda, MD;  Location: [facility] OR;  Service: General;  Laterality: Right;   INCISION AND DRAINAGE ABSCESS Right 02/08/2014   Procedure: INCISION AND DRAINAGE RIGHT BREAST ABSCESS;  Surgeon: Donnice Belinda, MD;  Location: [facility] OR;  Service: General;  Laterality: Right;   INCISION AND DRAINAGE ABSCESS Right 06/14/2014   Procedure: INCISION AND DRAINAGE RIGHT BREAST ABSCESS;  Surgeon: Donnice Belinda, MD;  Location: [facility] OR;  Service: General;  Laterality: Right;   IR IMAGING GUIDED PORT INSERTION  12/08/2021   IRRIGATION AND DEBRIDEMENT ABSCESS  12/19/2010  Procedure: IRRIGATION AND DEBRIDEMENT ABSCESS;  Surgeon: Donnice Bury, MD;   Location: [facility] OR;  Service: General;  Laterality: Right;   IRRIGATION AND DEBRIDEMENT ABSCESS  08/13/2011   Procedure: MINOR INCISION AND DRAINAGE OF ABSCESS;  Surgeon: Lynwood MALVA Pina, MD;  Location: [facility] OR;  Service: General;  Laterality: Right;  Right Breast    IRRIGATION AND DEBRIDEMENT ABSCESS  11/17/2011   Procedure: IRRIGATION AND DEBRIDEMENT ABSCESS;  Surgeon: Donnice POUR. Belinda, MD;  Location: [facility] OR;  Service: General;  Laterality: Right;  irrigation and debridement right recurrent breast abscess   LARYNX SURGERY     LEFT HEART CATH AND CORONARY ANGIOGRAPHY N/A 04/09/2021   Procedure: LEFT HEART CATH AND CORONARY ANGIOGRAPHY;  Surgeon: Dann Candyce RAMAN, MD;  Location: [facility] INVASIVE CV LAB;  Service: Cardiovascular;  Laterality: N/A;   LEFT HEART CATHETERIZATION WITH CORONARY ANGIOGRAM N/A 05/29/2012   Procedure: LEFT HEART CATHETERIZATION WITH CORONARY ANGIOGRAM & PTCA;  Surgeon: Debby DELENA Sor, MD;  Location: [facility] CATH LAB;  Service: Cardiovascular; Bifurcation dRCA-RPAD/PLA & rPDA  PTCA.   LEFT HEART CATHETERIZATION WITH CORONARY ANGIOGRAM N/A 11/08/2013   Procedure: LEFT HEART CATHETERIZATION WITH CORONARY ANGIOGRAM and Coronary Stent Intervention;  Surgeon: Alm LELON Clay, MD;  Location: [facility] CATH LAB;  Service: Cardiovascular; mLAD 80% (FFR 0.73) - resolute DES 3.0 x 22 mm (postdilated to 3.3 mm->3.5 mm)   NM MYOVIEW  LTD  01/26/2009   Normal study, no evidence of ischemia, EF 67%   ployp removed from voice box 03/30/12     RADIAL ARTERY HARVEST Right 06/30/2021   Procedure: RADIAL ARTERY HARVEST;  Surgeon: Shyrl Linnie MALVA, MD;  Location: [facility] OR;  Service: Open Heart Surgery;  Laterality: Right;   REFRACTIVE SURGERY  ~ 2010   right   TEE WITHOUT CARDIOVERSION N/A 06/30/2021   Procedure: TRANSESOPHAGEAL ECHOCARDIOGRAM (TEE);  Surgeon: Shyrl Linnie MALVA, MD;  Location: [facility] OR;  Service: Open Heart Surgery;  Laterality: N/A;   TONSILLECTOMY  1988   VIDEO BRONCHOSCOPY WITH ENDOBRONCHIAL  ULTRASOUND Bilateral 08/21/2021   Procedure: VIDEO BRONCHOSCOPY WITH ENDOBRONCHIAL ULTRASOUND;  Surgeon: Brenna Adine CROME, DO;  Location: [facility] ENDOSCOPY;  Service: Pulmonary;  Laterality: Bilateral;    REVIEW OF SYSTEMS:   Constitutional: Positive for decreased appetit and weight loss.  Negative for  chills fever.   HENT: Negative for mouth sores, nosebleeds, sore throat and trouble swallowing.   Eyes: Negative for eye problems and icterus.  Respiratory: Positive for stable dyspnea on exertion and mild cough. Negative for hemoptysis and wheezing.   Cardiovascular: Negative for chest pain. Positive for leg swelling.  Gastrointestinal: Positive for bloating and early satiety. Positive for occasional constipation. Negative for diarrhea, nausea and vomiting.  Genitourinary: Negative for bladder incontinence, difficulty urinating, dysuria, frequency and hematuria.   Musculoskeletal: Positive for chronic back pain. Negative for gait problem, neck pain and neck stiffness.  Skin: Negative for itching and rash.  Neurological: Negative for dizziness, extremity weakness, gait problem, headaches, light-headedness and seizures.  Hematological: Negative for adenopathy. Does not bruise/bleed easily.  Psychiatric/Behavioral: Negative for confusion, depression and sleep disturbance. The patient is not nervous/anxious.     PHYSICAL EXAMINATION:  Blood pressure 113/75, pulse 97, temperature 97.7 F (36.5 C), temperature source Temporal, resp. rate 15, weight 171 lb 9.6 oz (77.8 kg), last menstrual period 06/12/2016, SpO2 99%.  ECOG PERFORMANCE STATUS: 1  Physical Exam  Constitutional: Oriented to person, place, and time and well-developed, well-nourished, and in no distress.  HENT:  Head: Normocephalic and atraumatic. I  did not appreciate significant swelling of the right face or eyelid. No eye tearing at this time.  Mouth/Throat: Oropharynx is clear and moist. No oropharyngeal exudate.  Eyes: Conjunctivae  are normal. Right eye exhibits no discharge. Left eye exhibits no discharge. No scleral icterus.  Neck: Normal range of motion. Neck supple.  Cardiovascular: Normal rate, regular rhythm, normal heart sounds and intact distal pulses.   Pulmonary/Chest: Effort normal and breath sounds normal. No respiratory distress. No wheezes. No rales.  Abdominal: Distended abdomen/firm. Positive for improvement in her ascites. Bowel sounds are normal. Exhibits no distension and no mass. There is no tenderness.  Musculoskeletal: Normal range of motion. Positive for edema.  Lymphadenopathy:    No cervical adenopathy.  Neurological: Alert and oriented to person, place, and time. Exhibits normal muscle tone. Gait normal. Coordination normal.  Skin: Skin is warm and dry.  + Darkening on right foot.  Some skin darkening and erythema in the central abdominal area which is reportedly improved compared to prior although I have not seen the patient since April) not diaphoretic. No pallor.  Psychiatric: Normal for memory and judgment normal.  Vitals reviewed.    LABORATORY DATA: Lab Results  Component Value Date   WBC 5.3 01/19/2023   HGB 11.0 (L) 01/19/2023   HCT 32.0 (L) 01/19/2023   MCV 91.4 01/19/2023   PLT 334 01/19/2023      Chemistry      Component Value Date/Time   NA 137 01/19/2023 1026   NA 140 04/03/2021 1059   K 3.7 01/19/2023 1026   CL 105 01/19/2023 1026   CO2 26 01/19/2023 1026   BUN 15 01/19/2023 1026   BUN 13 04/03/2021 1059   CREATININE 1.04 (H) 01/19/2023 1026   CREATININE 0.65 11/06/2013 1520      Component Value Date/Time   CALCIUM  8.4 (L) 01/19/2023 1026   ALKPHOS 71 01/19/2023 1026   AST 10 (L) 01/19/2023 1026   ALT <5 01/19/2023 1026   BILITOT 0.6 01/19/2023 1026       RADIOGRAPHIC STUDIES:  US  Paracentesis Result Date: 01/20/2023 INDICATION: Patient with history of right lung adenocarcinoma, recurrent ascites; request received for therapeutic paracentesis up to 5  liters EXAM: ULTRASOUND GUIDED THERAPEUTIC  PARACENTESIS MEDICATIONS: 8 mL 1% lidocaine  COMPLICATIONS: None immediate. PROCEDURE: Informed written consent was obtained from the patient after a discussion of the risks, benefits and alternatives to treatment. A timeout was performed prior to the initiation of the procedure. Initial ultrasound scanning demonstrates a large amount of ascites within the right mid to lower abdominal quadrant. The right mid to lower abdomen was prepped and draped in the usual sterile fashion. 1% lidocaine  was used for local anesthesia. Following this, a 19 gauge, 10-cm, Yueh catheter was introduced. An ultrasound image was saved for documentation purposes. The paracentesis was performed. The catheter was removed and a dressing was applied. The patient tolerated the procedure well without immediate post procedural complication. FINDINGS: A total of approximately 5 liters of milky/chylous fluid was removed. IMPRESSION: Successful ultrasound-guided therapeutic paracentesis yielding 5 liters of peritoneal fluid. Performed by: Kevin Allred,PA-C Electronically Signed   By: Wilkie Lent M.D.   On: 01/20/2023 12:13   CT Chest W Contrast Result Date: 01/19/2023 CLINICAL DATA:  Non-small cell lung cancer, assess treatment response. * Tracking Code: BO * EXAM: CT CHEST WITH CONTRAST TECHNIQUE: Multidetector CT imaging of the chest was performed during intravenous contrast administration. RADIATION DOSE REDUCTION: This exam was performed according to the departmental dose-optimization program  which includes automated exposure control, adjustment of the mA and/or kV according to patient size and/or use of iterative reconstruction technique. CONTRAST:  75mL OMNIPAQUE  IOHEXOL  300 MG/ML  SOLN COMPARISON:  CT abdomen pelvis 12/14/2022 and CT chest 09/28/2022. PET 08/28/2021. FINDINGS: Cardiovascular: Atherosclerotic calcification of the aorta. Heart is at the upper limits of normal in size to mildly  enlarged. No pericardial effusion. Mediastinum/Nodes: 8 mm low-attenuation lesion in the right thyroid . No follow-up recommended. (Ref: J Am Coll Radiol. 2015 Feb;12(2): 143-50).No pathologically enlarged mediastinal, hilar or axillary lymph nodes. Esophagus is grossly unremarkable. Lungs/Pleura: Centrilobular and paraseptal emphysema. Spiculated nodular consolidation in right upper lobe measures 0.8 x 1.7 cm (6/41), stable when remeasured in a similar fashion. Scattered pulmonary parenchymal scarring. Small left pleural effusion. Airway is unremarkable. Upper Abdomen: Blush of hyperattenuation in the dome of the liver, too small to characterize. Right hepatic lobe cyst. Vague 2.3 cm low-attenuation lesion in the caudate (2/131), stable and possibly a hemangioma. 3.1 cm right adrenal mass measures 44 Hounsfield units, stable and characterized as an adenoma on 08/28/2021. Slight thickening of the left adrenal gland. Large ascites. Musculoskeletal: Minimal degenerative change in the spine. Old left rib fracture. IMPRESSION: 1. Stable right upper lobe primary bronchogenic carcinoma. 2. Small left pleural effusion. 3. Large ascites. 4. Right adrenal adenoma. 5.  Aortic atherosclerosis (ICD10-I70.0). 6.  Emphysema (ICD10-J43.9). Electronically Signed   By: Newell Eke M.D.   On: 01/19/2023 11:58   US  Paracentesis Result Date: 01/03/2023 INDICATION: Patient is a 58 y/o female with history of adenocarcinoma of the right lung. Patient is scheduled for a therapeutic paracentesis due to ascites. EXAM: ULTRASOUND GUIDED THERAPEUTIC PARACENTESIS MEDICATIONS: 10mL of lidocaine  1%. COMPLICATIONS: None immediate. PROCEDURE: Informed written consent was obtained from the patient after a discussion of the risks, benefits and alternatives to treatment. A timeout was performed prior to the initiation of the procedure. Initial ultrasound scanning demonstrates a large amount of ascites within the right lower abdominal quadrant.  The right lower abdomen was prepped and draped in the usual sterile fashion. 1% lidocaine  was used for local anesthesia. Following this, a 19 gauge, 10-cm, Yueh catheter was introduced. An ultrasound image was saved for documentation purposes. The paracentesis was performed. The catheter was removed and a dressing was applied. The patient tolerated the procedure well without immediate post procedural complication. FINDINGS: A total of approximately 5L of cloudy yellow fluid was removed. Samples were sent to the laboratory as requested by the clinical team. IMPRESSION: Successful ultrasound-guided paracentesis yielding 5L of peritoneal fluid. Performed by Daved Hipp PA-C Electronically Signed   By: Thom Hall M.D.   On: 01/03/2023 17:02     ASSESSMENT/PLAN:  This is a very pleasant 58 year old African-American female diagnosed with stage IV (T2 a, N2, M1 C) non-small cell lung cancer, adenocarcinoma.  The patient presented with a right upper lobe lung mass in addition to right hilar and mediastinal lymphadenopathy.  She also has innumerable bilateral pulmonary nodules, metastatic bone, liver, and adrenal lesions.  She was diagnosed in August 2023.  She is positive for K-ras G12 C mutation and her PD-L1 expression is 89%     The patient completed  palliative radiotherapy to the metastatic bone lesions at L4 and the sacrum under the care of Dr. Dewey.  The last day radiation was 10/22/21.     The patient was undergoing palliative systemic chemotherapy with carboplatin  for an AUC of 5, Alimta  500 mg per metered square, Keytruda  200 mg IV every 3  weeks.  She is status post 19 cycles.  Starting from cycle #5 the patient started maintenance Alimta  and Keytruda .  Starting from cycle #10, due to the patient's concern with quality of life versus quantity of life, her dose of chemotherapy was slightly reduced to 400 mg/m. She self discontinued her treatment in September 2024 due to side effects and quality  of life.   Since being off treatment, she has been struggling with significant ascites. Her PCP ordered CT of the AP which showed large volume ascites without any clear evidence of progressive metastatic disease. She has had two paracenteses, both of which  were yielded 5 L of fluid. Her first paracentesis was tested and was negative for malignant cells.   She required CT of the chest to restage her disease.   The patient was seen with Dr. Sherrod today.  Dr. Sherrod personally and independently reviewed the scan and discussed results with the patient today.  The scan showed no evidence of disease progression.  Dr. Sherrod recommends she continue on observation with close interval follow-up CT scan of the chest, abdomen, pelvis in 3 months.  The patient's PCP had ordered a CT of the abdomen pelvis which was performed in December 2024 which also did not show any clear evidence of disease progression in the abdomen/pelvis.  Cytology was negative on her paracentesis.  Dr. Sherrod explained that her ascites could be from other etiologies such as anasarca (she has some generalized swelling in her lower extremities as well) although we cannot necessarily exclude malignant ascites.  However, we do not have any evidence at this time that this is a malignant ascites. However we would recommend that anytime she has a paracentesis that cytology is performed.  Should she ever have evidence of a malignant ascites, we would likely need to talk to the patient about resuming treatment.  I will arrange for flush appointments every 6 to 8 weeks since she has a Port-A-Cath.  We will see the patient back for follow-up visit 1 week after her restaging CT scan in 3 months.  I let the patient know that it is important to always have a follow-up appointment to ensure she does not get lost to follow-up.  She was advised to always call us  if she does not have a follow-up appointment scheduled within 1 week for last being seen    She will continue taking Lasix  and spironolactone  as directed by her PCP for her ascites.  She will keep a close eye on her weight and reach out to their office should she have reaccumulation of ascites.  The patient was advised to call immediately if she has any concerning symptoms in the interval. The patient voices understanding of current disease status and treatment options and is in agreement with the current care plan. All questions were answered. The patient knows to call the clinic with any problems, questions or concerns. We can certainly see the patient much sooner if necessary      Orders Placed This Encounter  Procedures   CT CHEST ABDOMEN PELVIS W CONTRAST    Standing Status:   Future    Expected Date:   04/18/2023    Expiration Date:   01/24/2024    If indicated for the ordered procedure, I authorize the administration of contrast media per Radiology protocol:   Yes    Does the patient have a contrast media/X-ray dye allergy?:   No    Preferred imaging location?:   [facility]  If indicated for the ordered procedure, I authorize the administration of oral contrast media per Radiology protocol:   Yes   CBC with Differential (Cancer Center Only)    Standing Status:   Future    Expected Date:   04/24/2023    Expiration Date:   01/24/2024   CMP (Cancer Center only)    Standing Status:   Future    Expected Date:   04/24/2023    Expiration Date:   01/24/2024      Celeste Tavenner L Anabella Capshaw, PA-C 01/24/23  ADDENDUM: Hematology/Oncology Attending:  I had a face-to-face encounter with the patient today.  I reviewed her records, lab, scans and recommended her care plan.  This is a very pleasant 58 years old African-American female with history of stage IV non-small cell lung cancer, adenocarcinoma diagnosed in August 2023 with positive KRAS G12C mutation and PD-L1 expression of 89%. The patient was treated with palliative radiation to the painful metastatic bone lesion  at L4 and sacrum.  She is then started palliative systemic chemotherapy initially with carboplatin , Alimta  and Keytruda  for 4 cycles followed by 15 cycles of maintenance treatment with Alimta  and Keytruda  that was discontinued in September 2024 based on her request because of concern about adverse effects especially fatigue.  She has been on observation since that time.  She had frequent accumulation of ascites status post paracentesis at least twice recently with drainage of more than 5 L of fluid but the cytology is negative for malignancy.  She has a history of fluid retention and edema in the past but the possibility of malignant ascites could not be completely excluded at this point. She had repeat CT scan of the chest performed recently.  I personally and independently reviewed the scan and discussed the result with the patient today.  Her scan showed no concerning findings for disease progression in the chest and her previous imaging studies of the abdomen and pelvis were also unremarkable except for the ascites. I recommended for the patient to continue on observation with repeat CT scan of the chest, abdomen and pelvis in 3 months. She will continue with paracentesis on as-needed basis. If the cytology of the ascites confirmed the presence of malignant cells, we will consider restarting her treatment again with a combination of chemoimmunotherapy or with targeted therapy for the KRAS G12C mutation. The patient was also advised to call immediately if she has any concerning symptoms in the interval. The total time spent in the appointment was 30 minutes. Disclaimer: This note was dictated with voice recognition software. Similar sounding words can inadvertently be transcribed and may be missed upon review. Sherrod MARLA Sherrod, MD

## 2023-01-24 ENCOUNTER — Inpatient Hospital Stay (HOSPITAL_BASED_OUTPATIENT_CLINIC_OR_DEPARTMENT_OTHER): Payer: Medicaid Other | Admitting: Physician Assistant

## 2023-01-24 ENCOUNTER — Ambulatory Visit: Payer: Medicaid Other | Admitting: Physician Assistant

## 2023-01-24 VITALS — BP 113/75 | HR 97 | Temp 97.7°F | Resp 15 | Wt 171.6 lb

## 2023-01-24 DIAGNOSIS — C3491 Malignant neoplasm of unspecified part of right bronchus or lung: Secondary | ICD-10-CM

## 2023-01-24 DIAGNOSIS — R188 Other ascites: Secondary | ICD-10-CM | POA: Insufficient documentation

## 2023-01-24 DIAGNOSIS — C3411 Malignant neoplasm of upper lobe, right bronchus or lung: Secondary | ICD-10-CM | POA: Diagnosis not present

## 2023-01-24 MED ORDER — ACCU-CHEK AVIVA PLUS VI STRP: ORAL_STRIP | 1 refills | Status: DC

## 2023-01-27 ENCOUNTER — Telehealth: Payer: Self-pay

## 2023-01-27 ENCOUNTER — Encounter: Payer: Self-pay | Admitting: Internal Medicine

## 2023-01-27 NOTE — Telephone Encounter (Signed)
Patient calls nurse line regarding concerns with ascites. She reports that she has had two appointments for paracentesis in the last month.   She was seen by her oncologist on 01/24/23 who advised that she follow up with PCP office regarding ascites. She discussed with oncologist getting a long term drain for management of ascites. She was told she would need to follow up with our office regarding this.   She denies any difficulty breathing, shortness of breath or chest pain. Scheduled for tomorrow afternoon with Dr. Royal Piedra.   ED precautions discussed.   Cassidy Prude, RN

## 2023-01-28 ENCOUNTER — Encounter: Payer: Self-pay | Admitting: Family Medicine

## 2023-01-28 ENCOUNTER — Ambulatory Visit (INDEPENDENT_AMBULATORY_CARE_PROVIDER_SITE_OTHER): Payer: Medicaid Other | Admitting: Family Medicine

## 2023-01-28 VITALS — BP 112/66 | HR 100 | Wt 173.0 lb

## 2023-01-28 DIAGNOSIS — C3491 Malignant neoplasm of unspecified part of right bronchus or lung: Secondary | ICD-10-CM

## 2023-01-28 DIAGNOSIS — R300 Dysuria: Secondary | ICD-10-CM | POA: Diagnosis not present

## 2023-01-28 DIAGNOSIS — K5900 Constipation, unspecified: Secondary | ICD-10-CM

## 2023-01-28 DIAGNOSIS — R188 Other ascites: Secondary | ICD-10-CM | POA: Diagnosis present

## 2023-01-28 LAB — POCT URINALYSIS DIP (MANUAL ENTRY)
Bilirubin, UA: NEGATIVE
Blood, UA: NEGATIVE
Glucose, UA: NEGATIVE mg/dL
Ketones, POC UA: NEGATIVE mg/dL
Leukocytes, UA: NEGATIVE
Nitrite, UA: NEGATIVE
Protein Ur, POC: NEGATIVE mg/dL
Spec Grav, UA: 1.015 (ref 1.010–1.025)
Urobilinogen, UA: 0.2 U/dL
pH, UA: 5 (ref 5.0–8.0)

## 2023-01-28 MED ORDER — LACTULOSE 10 GM/15ML PO SOLN: 10.0000 g | Freq: Every day | ORAL | 0 refills | Status: AC | PRN

## 2023-01-28 NOTE — Patient Instructions (Addendum)
It was great to see you today! Thank you for choosing [facility] for your primary care.  Today we addressed: You do not have a UTI, but make sure to drink lots of water.  I am ordering lactulose for your constipation.  You will get a call about your paracentesis and catheter placement.  Please return in 1-2 months for follow up.  Thank you for coming to see [place] at [facility] and for the opportunity to care for you! Alfreida Steffenhagen, MD 01/28/2023, 4:22 PM

## 2023-01-28 NOTE — Assessment & Plan Note (Signed)
Likely terminal.  Patient is coping, but understandably difficult for her and family.  Will strive to optimize comfort, treat symptoms, and provide support as able.  Discussed hospice with patient, but she worries that switching to hospice will cost her the relationships and support she has built with Oncology.  Recommended discussing these fears at Palliative appointment 1/29. -Following with Palliative, next appointment 1/29 -MOST form and DNR discussed, patient still undecided

## 2023-01-28 NOTE — Progress Notes (Signed)
SUBJECTIVE:   CHIEF COMPLAINT / HPI:  Cassidy [name] is a 58 y.o. female with a pertinent past medical history of Stage IV non-small cell lung cancer (liver, bone, and calvarial metastases) following with Palliative Care as well as recurrent ascites presenting to the clinic for management of ascites and care coordination.  Metastatic lung cancer  Ascites Stage IV non-small cell lung cancer, adenocarcinoma diagnosed in August 2023. Completed palliative radiotherapy to the metastatic bone lesions at L4 and the sacrum 10/22/21. Underwent palliative systemic chemotherapy. After 19 cycles, self discontinued treatment in September 2024 due to side effects and quality of life. Began following with Palliative. CT scan 12/3 showed significant ascites increased from 9/20.  TTE with EF 60-65% 9/18.  Paracentesis 12/23 and 1/9 with 5L output both times.  No malignant cells noted on pathology. Last seen by Oncology 1/13, planning for surveillance CT every 3 months, further treatment per patient's goals. Last saw Palliative 1/8, discussed Hospice, but does not wish to pursue because she does not want to lose touch with her Oncologists and the rapport she has with them.  Next follow up 1/29. Now presenting to discuss ascites because Oncology recommended PCP follow up to discuss long term drain placement.  Constipation Lots of discomfort and feels constipated, relief with BMs, which are small and hard when they occur every few days.  Taking Gavilax 2 caps daily and colace.  Taking ~20 mg of oxycodone and Xtampza as needed, consistently.  Dysuria Pain with urination started a few days ago, worse when ascites build up.  Has been having burning with urination for the past 4 days.  Has a history of UTIs.   PERTINENT PMH / PSH: Former smoker with 22 pack year history. Currently struggling financially and has recently had to sell car. Difficult to cope with current medical situation and difficult to help  family deal with severity of her health.   OBJECTIVE:   BP 112/66   Pulse 100   Wt 173 lb (78.5 kg)   LMP 06/12/2016 (Approximate)   BMI 27.10 kg/m  Pulse recheck 90 in room.  General: Appears tired and chronically ill, resting in chair, NAD, alert and at baseline. Cardiovascular: Regular rate and rhythm. Normal S1/S2. No murmurs, rubs, or gallops appreciated. 2+ radial pulses. Pulmonary: Clear bilaterally to ascultation. No increased WOB, no accessory muscle usage on room air. No wheezes, crackles, or rhonchi. Abdominal: Significant abdominal distension with shifting fluid wave, tender to light palpation. No rebound or guarding. Skin: Warm and dry.  No rashes grossly. Extremities: 2+ pitting peripheral edema to thighs bilaterally. Capillary refill <2 seconds. Psych: Flat affect.  Polite and appropriate, but depressed mood.   ASSESSMENT/PLAN:   Assessment & Plan Refractory ascites Possibly related to malignancy, though no malignant cells on cytology 12/23 and no cytology obtained 1/9.  Recurrent and rapidly accumulating.  After discussion of risks and benefits of tunneled catheter placement (including infection risk), patient would like to proceed with procedure.  IR will reach out to patient for further scheduling.  For now, paracentesis ordered with cytology.  Albumin 3.0, not critical, no albumin ordered with ascites. -IR paracentesis with cytology -IR percutaneous tunneled catheter (PleurX) placement ordered Adenocarcinoma of right lung, stage 4 (HCC) Likely terminal.  Patient is coping, but understandably difficult for her and family.  Will strive to optimize comfort, treat symptoms, and provide support as able.  Discussed hospice with patient, but she worries that switching to hospice will cost her the relationships and  support she has built with Oncology.  Recommended discussing these fears at Palliative appointment 1/29. -Following with Palliative, next appointment 1/29 -MOST  form and DNR discussed, patient still undecided Dysuria UA negative for UTI.  Symptoms likely related to ascites given sensation of pressure, positional nature, and worsening with accumulation of fluid.  Expect improvement after paracentesis. Constipation, unspecified constipation type Likely in the setting of chronic opiate use and some obstruction 2/2 ascites.  Will recommend titration of laxatives to single daily soft BM. -Continue Gavilax and colace -Add lactulose daily PRN, recommended caution with initial use to avoid significant volume loss and distress  Return in about 1 month (around 02/28/2023).  Cassidy Grist Sharion Dove, MD [facility]

## 2023-02-01 ENCOUNTER — Encounter: Payer: Self-pay | Admitting: Family Medicine

## 2023-02-01 DIAGNOSIS — R188 Other ascites: Secondary | ICD-10-CM

## 2023-02-02 ENCOUNTER — Ambulatory Visit (HOSPITAL_COMMUNITY)
Admission: RE | Admit: 2023-02-02 | Discharge: 2023-02-02 | Disposition: A | Discharge status: Home / Self Care | Payer: Medicaid Other | Source: Ambulatory Visit | Attending: Family Medicine | Admitting: Family Medicine

## 2023-02-02 ENCOUNTER — Telehealth (HOSPITAL_COMMUNITY): Payer: Self-pay

## 2023-02-02 DIAGNOSIS — R188 Other ascites: Secondary | ICD-10-CM | POA: Diagnosis present

## 2023-02-02 HISTORY — PX: IR PARACENTESIS: IMG2679

## 2023-02-02 MED ORDER — LIDOCAINE HCL 1 % IJ SOLN
10.0000 mL | Freq: Once | INTRAMUSCULAR | Status: AC
  Administered 2023-02-02: 10 mL via INTRADERMAL

## 2023-02-02 MED ORDER — LIDOCAINE HCL 1 % IJ SOLN
INTRAMUSCULAR | Status: AC
  Filled 2023-02-02: qty 20

## 2023-02-02 NOTE — Procedures (Signed)
PROCEDURE SUMMARY:  Successful US guided paracentesis from right lateral abdomen.  Yielded 6.0 liters of cloudy, chylous-appearing fluid.  No immediate complications.  Pt tolerated well.   Specimen was sent for labs.  EBL < 5mL  Senaida Ores Yahshua Thibault PA-C 02/02/2023 1:25 PM

## 2023-02-02 NOTE — Telephone Encounter (Signed)
-----   Message from Durwin Glaze sent at 02/02/2023  2:49 PM EST ----- Regarding: RE: pleurx catheter placement Ok for abd pleurX Met lung ca, palliate pt  DDH ----- Message ----- From: Sharee Pimple Sent: 02/02/2023   1:55 PM EST To: Ir Procedure Requests Subject: pleurx catheter placement                      Procedure: Pleurx catheter   Dx: recurrent ascites  Ordering: Dr. Levert Feinstein [phone]  Imaging: In epic  Please review.   Thanks,  Fara Boros

## 2023-02-03 ENCOUNTER — Telehealth: Payer: Self-pay

## 2023-02-03 ENCOUNTER — Telehealth: Payer: Self-pay | Admitting: Medical Oncology

## 2023-02-03 LAB — CYTOLOGY - NON PAP

## 2023-02-03 NOTE — Progress Notes (Signed)
Palliative Medicine [facility]  [phone] [phone]   Name: Cassidy [name] Date: 02/03/2023 MRN: [id number]  DOB: 26-Mar-1965  Patient Care Team: [name], MD as PCP - General (Family Medicine) O'Neal, Ronnald Ramp, MD as PCP - Cardiology (Cardiology) Emelia Loron, MD as Consulting Physician (General Surgery) Pickenpack-Cousar, Arty Baumgartner, NP as Nurse Practitioner (Hospice and Palliative Medicine) Marcos Eke, RN as Registered Nurse [facility]   I connected with DAMIRA KEM on 02/09/23 at 11:00 AM EST by phone and verified that I am speaking with the correct person using two identifiers.   I discussed the limitations, risks, security and privacy concerns of performing an evaluation and management service by telemedicine and the availability of in-person appointments. I also discussed with the patient that there may be a patient responsible charge related to this service. The patient expressed understanding and agreed to proceed.   Other persons participating in the visit and their role in the encounter: n/a   Patient's location: home  Provider's location: [facility]   Chief Complaint: f/u of symptom management    INTERVAL HISTORY: Cassidy [name] is a 58 y.o. 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  Palliative ask to see for symptom management and goals of care.   SOCIAL HISTORY:    Ms. Cavan reports that she has quit smoking. Her smoking use included cigarettes. She started smoking about 44 years ago. She has a 22 pack-year smoking history. She has never used smokeless tobacco. She reports that she does not currently use drugs. She reports that she does not drink alcohol.  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  s/p PCTA to dRCA and ostial RPAV-PLA vessel + PDA branch (05/2012)  b. [place] s/p DES to mLAD - Resolute DES 3.0 x 22 (3.36mm -->3.3 mm)   Bipolar depression (HCC)    Breast abscess    a. right side.    COPD (chronic obstructive pulmonary disease) (HCC)    Depression    Diabetic peripheral neuropathy (HCC)    Fibromyalgia    Genital warts    GERD (gastroesophageal reflux disease)    History of hiatal hernia    HLD (hyperlipidemia)    Hypertension    Lung cancer (HCC)    Pain in limb    a. LE VENOUS DUPLEX, 02/05/2009 - no evidence of deep vein thrombosis, Baker's cyst   Pneumonia    PTSD (post-traumatic stress disorder)    Sickle cell trait (HCC)    Tobacco abuse    Tooth caries    Type II diabetes mellitus (HCC)     ALLERGIES:  is allergic to amoxil [amoxicillin], chantix [varenicline], suboxone [buprenorphine hcl-naloxone hcl], and emend [fosaprepitant dimeglumine].  MEDICATIONS:  Current Outpatient Medications  Medication Sig Dispense Refill   aspirin EC 81 MG tablet Take 81 mg by mouth daily.     atorvastatin (LIPITOR) 40 MG tablet TAKE 1 TABLET (40 MG TOTAL) BY MOUTH DAILY. 90 tablet 3   busPIRone (BUSPAR) 15 MG tablet Take 0.5 tablets (7.5 mg total)  by mouth 2 (two) times daily. 60 tablet 3   Carboxymethylcellul-Glycerin (LUBRICATING EYE DROPS OP) Place 1 drop into both eyes daily as needed (dry eyes).     carvedilol (COREG) 12.5 MG tablet Take 1 tablet (12.5 mg total) by mouth 2 (two) times daily with a meal. 180 tablet 1   diclofenac Sodium (VOLTAREN) 1 % GEL Apply 1 application topically 3 times daily as needed for pain 200 g 1   docusate sodium (COLACE) 100 MG capsule Take 1 capsule (100 mg total) by mouth  daily. 10 capsule 0   DULoxetine (CYMBALTA) 30 MG capsule Take 1 capsule (30 mg total) by mouth daily. 30 capsule 5   ferrous sulfate 325 (65 FE) MG tablet Take 1 tablet (325 mg total) by mouth daily with breakfast. 30 tablet 2   fluticasone (FLONASE) 50 MCG/ACT nasal spray Place 1 spray into both nostrils daily as needed (congestion). 16 g 1   folic acid (FOLVITE) 1 MG tablet TAKE 1 TABLET (1 MG TOTAL) BY MOUTH DAILY. 30 tablet 3   furosemide (LASIX) 20 MG tablet Take 1 tablet (20 mg total) by mouth daily.     glucose blood (ACCU-CHEK AVIVA PLUS) test strip Use as instructed 50 each 1   hyoscyamine (LEVSIN SL) 0.125 MG SL tablet Place 1 tablet (0.125 mg total) under the tongue every 6 (six) hours as needed. For abdominal cramping 30 tablet 0   insulin glargine (LANTUS) 100 UNIT/ML Solostar Pen Inject 24 Units into the skin every morning. 3 mL 11   Insulin Pen Needle 32G X 4 MM MISC 1 Needle by Does not apply route in the morning and at bedtime. 120 each 2   ipratropium (ATROVENT HFA) 17 MCG/ACT inhaler Inhale 2 puffs into the lungs every 4 (four) hours as needed for wheezing (COPD).     lactulose (CHRONULAC) 10 GM/15ML solution Take 15 mLs (10 g total) by mouth daily as needed for moderate constipation or severe constipation. 236 mL 0   lidocaine-prilocaine (EMLA) cream APPLY 1 APPLICATION TOPICALLY AS NEEDED. 30 g 2   LORazepam (ATIVAN) 0.5 MG tablet Take 1 tablet (0.5 mg total) by mouth every 8 (eight) hours as needed for anxiety (nausea). 30 tablet 0   metoCLOPramide (REGLAN) 10 MG tablet Take 1 tablet (10 mg total) by mouth every 8 (eight) hours as needed for nausea. 60 tablet 2   mometasone-formoterol (DULERA) 100-5 MCG/ACT AERO Inhale 2 puffs into the lungs in the morning and at bedtime. 13 g 3   nicotine polacrilex (NICORETTE) 2 MG gum Take 1 each (2 mg total) by mouth as needed for smoking cessation. 100 tablet 0   ondansetron (ZOFRAN-ODT) 8 MG disintegrating tablet Take 1 tablet (8 mg  total) by mouth every 8 (eight) hours as needed for nausea or vomiting. (Patient not taking: Reported on 01/28/2023) 30 tablet 2   oxyCODONE ER (XTAMPZA ER) 13.5 MG C12A Take 13.5 mg by mouth every 12 (twelve) hours. 60 capsule 0   Oxycodone HCl 10 MG TABS Take 1-2 tablets (10-20 mg total) by mouth every 4 (four) hours as needed. 90 tablet 0   pantoprazole (PROTONIX) 40 MG tablet Take 1 tablet (40 mg total) by mouth 2 (two) times daily. 60 tablet 5   polyethylene glycol powder (GAVILAX) 17 GM/SCOOP powder Take 17 g by mouth daily. 850 g 1   prochlorperazine (COMPAZINE) 10 MG tablet TAKE 1 TABLET (10 MG TOTAL) BY MOUTH EVERY 6 (SIX) HOURS AS NEEDED FOR NAUSEA OR VOMITING. (  Patient not taking: Reported on 01/28/2023) 30 tablet 0   spironolactone (ALDACTONE) 50 MG tablet Take 1 tablet (50 mg total) by mouth at bedtime. 90 tablet 0   valACYclovir (VALTREX) 1000 MG tablet Take for 3 days prn each outbreak. (Patient taking differently: Take 1,000 mg by mouth daily. Take for 3 days prn each outbreak.) 30 tablet 11   No current facility-administered medications for this visit.    VITAL SIGNS: LMP 06/12/2016 (Approximate)  There were no vitals filed for this visit.  Estimated body mass index is 27.1 kg/m as calculated from the following:   Height as of 01/19/23: 5\' 7"  (1.702 m).   Weight as of 01/28/23: 173 lb (78.5 kg).   PERFORMANCE STATUS (ECOG) : 1 - Symptomatic but completely ambulatory  Discussed the use of AI scribe software for clinical note transcription with the patient, who gave verbal consent to proceed.   IMPRESSION: .   I connected by phone with Ms. Zhen for symptom management follow-up. She reports significant pain and shortness of breath due to "fluid has built back up in my stomach!" Complains of abdominal bloating, pain, decrease appetite, fatigue and shortness of breath when this occurs. She is scheduled for PleurX catheter placement on 2/5 however will most likely need paracentesis  prior to then. Patient reports nursing staff with PCP is aware and assisting with arranging. She notes a change in her mucus, which has become thick and 'peppery looking'. Appetite fluctuates. Some days are better than others.   Ms. Charbonnet is complaining of abdominal bloating and significant pain in the back and side. The bloating and pain typically improve for two to three days following a drainage procedure but have since returned. Reports the back pain is persistent however worsens with ascites. She is tearful in expressing her pain is no longer controlled on previous regimen and spends most of her time uncomfortable and in distress. Emotional support provided. We discussed at length her current regimen which includes Xtampza ER every 12 hours and oxycodone as needed every 4 hours for breakthrough. Given increased pain with no relief of Xtampza we will transition her over to Fentanyl patch. Advised to continue with oxycodone as needed for breakthrough. Extensive education provided on use of Fentanyl patch including administration, efficacy, and potential side effect. Palmira verbalized understanding and appreciation.   We will continue to closely support and follow for needs. All questions answered and support provided.  Goals of Care  Ms. Bisher continues to be clear in her expressed goals to treat the treatable while aggressively managing her symptoms. She desires not to be in a suffering state as her quality of life is important to her. She is not ready to consider hospice care at this time.   01/19/23: I empathetically approached discussions regarding goals of care, quality of life, and healthcare limitations. Although patient's recent scans do not show significant cancer progression as discussed with Cassie, PA Ms. Esch's quality of life is clearly poor and symptom burden is high. She is realistic in her understanding of her current cancer state in addition to other co-morbidities. Nikya is clear in  her expressed wishes that her quality of life is most important to her and not being in a suffering state. She is tearful sharing she knows that time is limited. The patient has began preparing her son for this however he is not accepting at this time as the only child. Elowyn has spoken with this girlfriend who acknowledges her decline and is assisting in  preparing patient's son for the inevitable. Emotional support provided.   Deaira states she has no desire for her life to be prolonged if there is no quality. We discussed her full code status with consideration of her current illness. She shares desires for a natural death and her life to not be prolonged by life-sustaining measures including CPR or intubation. Education provided on DNR/DNI. She expressed understanding and agreement with the concept due to her deteriorating health status. She also would not want her family to struggle with such decisions. MOST form introduced and education provided on documents. The patient would like to take form home and review hoping to gain support from her son. She is interested in completing at follow-up visit. Would not wish for artificial nutrition.   Education provided on hospice's goals and philosophy of care. We discussed what care would look like in the home as this would be her desire. She understands this support will be needed in the future however at this time she is not read to transition her care to focus on end-of-life. States she has to be at peace specifically knowing that her son is understanding and prepared. She is also not ready to discontinue support at the cancer center or from her providers. Nikiesha would look like to continue taking things one day at at time for as long as she can.  The patient has also been experiencing financial difficulties, considering selling her car due to infrequent use and financial strain. Despite these challenges, the patient is proactive in managing her health,  regularly checking her medical charts and maintaining communication with her healthcare team.  Support has been provided and all questions answered. Patient knows to contact office as needed.  We discussed the importance of continued conversation with family and their medical providers regarding overall plan of care and treatment options, ensuring decisions are within the context of the patients values and GOCs.  Assessment and Plan  Ascites Increased abdominal distension and discomfort. Shortness of breath and change in sputum noted. Plan for paracentesis prior to scheduled procedure of PluerX catheter placement on 02/16/2023 Per PCP.  -Await scheduling for paracentesis.  Pain Management Current regimen of Xtampza and Oxycodone 10mg  not providing adequate relief. Discussed switching from Xtampza to Fentanyl patches and increasing Oxycodone dose.  -Discontinue Xtampza and start Fentanyl 12.36mcg patch every 72 hours. -Increase Oxycodone to 15mg  as needed for pain. -Advise patient to monitor for side effects such as drowsiness, confusion, and dizziness. -Follow-up in one week to assess response to new regimen.  End of Life Care Discussed the benefits of hospice care and the implications of a Do Not Resuscitate (DNR) order. -Consider hospice care for additional support at home. -Discuss DNR order with patient's brother, who will be the guardian. -Patient has advanced directives with son and brother listed as HCPOA.   Patient expressed understanding and was in agreement with this plan. She also understands that She can call the clinic at any time with any questions, concerns, or complaints.    Any controlled substances utilized were prescribed in the context of palliative care. PDMP has been reviewed.    Visit consisted of counseling and education dealing with the complex and emotionally intense issues of symptom management and palliative care in the setting of serious and potentially  life-threatening illness.  Willette Alma, AGPCNP-BC  Palliative Medicine Team/[facility]

## 2023-02-03 NOTE — Telephone Encounter (Signed)
Cassidy [name] with Dr. Asa Lente office calls nurse line requesting home health orders.   She reports with the order placed for pleurx catheter and paracentesis the patient will need home health nursing.   Advised will forward to ordering provider.

## 2023-02-03 NOTE — Telephone Encounter (Signed)
I LVM for PCP provider to contact [facility] for Home health /instruction with peritoneal catheter.

## 2023-02-07 ENCOUNTER — Other Ambulatory Visit: Payer: Self-pay | Admitting: Family Medicine

## 2023-02-07 DIAGNOSIS — R188 Other ascites: Secondary | ICD-10-CM

## 2023-02-07 DIAGNOSIS — C3491 Malignant neoplasm of unspecified part of right bronchus or lung: Secondary | ICD-10-CM

## 2023-02-07 NOTE — Telephone Encounter (Signed)
Received secure messages from Kerrin Mo and Diane, oncology regarding status of home health referral.   Will forward to Dr. Sharion Dove for update.   Veronda Prude, RN

## 2023-02-07 NOTE — Progress Notes (Signed)
Per request from IR scheduler, placing Home Health RN order, which is reportedly needed for Pleurx Catheter to be placed.  [facility] Referral coordinator notified.  Order for catheter itself placed in prior visit 1/17.  Patient has recurrent ascites, suspected 2/2 malignancy though not yet confirmed by pathology.  She understands risks and benefits of catheter placement and would like to move forward with this plan.

## 2023-02-09 ENCOUNTER — Inpatient Hospital Stay (HOSPITAL_BASED_OUTPATIENT_CLINIC_OR_DEPARTMENT_OTHER): Payer: Medicaid Other | Admitting: Nurse Practitioner

## 2023-02-09 ENCOUNTER — Encounter: Payer: Self-pay | Admitting: Nurse Practitioner

## 2023-02-09 DIAGNOSIS — R630 Anorexia: Secondary | ICD-10-CM | POA: Diagnosis not present

## 2023-02-09 DIAGNOSIS — C3491 Malignant neoplasm of unspecified part of right bronchus or lung: Secondary | ICD-10-CM

## 2023-02-09 DIAGNOSIS — Z515 Encounter for palliative care: Secondary | ICD-10-CM

## 2023-02-09 DIAGNOSIS — Z7189 Other specified counseling: Secondary | ICD-10-CM | POA: Diagnosis not present

## 2023-02-09 DIAGNOSIS — R530 Neoplastic (malignant) related fatigue: Secondary | ICD-10-CM | POA: Diagnosis not present

## 2023-02-09 DIAGNOSIS — G893 Neoplasm related pain (acute) (chronic): Secondary | ICD-10-CM

## 2023-02-09 MED ORDER — FENTANYL 12 MCG/HR TD PT72: 1.0000 | MEDICATED_PATCH | TRANSDERMAL | 0 refills | Status: DC

## 2023-02-09 MED ORDER — OXYCODONE HCL 15 MG PO TABS: 15.0000 mg | ORAL_TABLET | ORAL | 0 refills | Status: DC | PRN

## 2023-02-15 ENCOUNTER — Other Ambulatory Visit: Payer: Self-pay | Admitting: Radiology

## 2023-02-15 DIAGNOSIS — R180 Malignant ascites: Secondary | ICD-10-CM

## 2023-02-15 NOTE — Addendum Note (Signed)
Addended by: [name] on: 02/15/2023 04:53 PM   Modules accepted: Orders

## 2023-02-15 NOTE — H&P (Signed)
 Chief Complaint: Patient was seen in consultation today for recurrent malignant ascites.   Referring Physician(s): McIntyre,Brittany J  Supervising Physician: Jennefer Rover  Patient Status: [facility] - Out-pt  History of Present Illness: Cassidy [name] is a 58 y.o. female with a medical history significant for HTN, COPD, fibromyalgia, DM2, sickle cell trait and Stage IV non-small cell lung cancer diagnosed 2023. She has received palliative radio/chemotherapy but stopped treatment due to side effects. She developed malignant ascites last fall and has been seen in IR for several paracenteses. She has cancer related pain that is worse with ascites build up.    Interventional Radiology has been asked to evaluate this patient for an image-guided tunneled peritoneal catheter for palliative management recurrent malignant ascites.   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  s/p PCTA to dRCA and ostial RPAV-PLA vessel + PDA branch (05/2012)  b. [place]  s/p DES to mLAD - Resolute DES 3.0 x 22 (3.2mm -->3.3 mm)   Bipolar depression (HCC)    Breast abscess    a. right side.    COPD (chronic obstructive pulmonary disease) (HCC)    Depression    Diabetic peripheral neuropathy (HCC)    Fibromyalgia    Genital warts    GERD (gastroesophageal reflux disease)    History of hiatal hernia    HLD (hyperlipidemia)    Hypertension    Lung cancer (HCC)    Pain in limb    a. LE VENOUS DUPLEX, 02/05/2009 - no evidence of deep vein thrombosis, Baker's cyst   Pneumonia    PTSD (post-traumatic stress disorder)    Sickle cell trait (HCC)    Tobacco abuse    Tooth caries    Type II diabetes mellitus (HCC)     Past Surgical History:  Procedure Laterality Date   BLADDER SURGERY  1980   TVT   BLADDER SURGERY   2003   TVT   BREAST EXCISIONAL BIOPSY     BREAST SURGERY Right    I&D for multiple abscesses   CARDIAC CATHETERIZATION     CORONARY ARTERY BYPASS GRAFT N/A 06/30/2021   Procedure: CORONARY ARTERY BYPASS GRAFTING (CABG) x3 USING LEFT INTERNAL MAMMARY ARTERY,  RIGHT RADIAL ARTERY AND LEFT GREATER SAPHENOUS VEIN;  Surgeon: Shyrl Linnie KIDD, MD;  Location: [facility] OR;  Service: Open Heart Surgery;  Laterality: N/A;   cutting balloon     ENDOVEIN HARVEST OF GREATER SAPHENOUS VEIN Left 06/30/2021   Procedure: ENDOVEIN HARVEST OF GREATER SAPHENOUS VEIN;  Surgeon: Shyrl Linnie KIDD, MD;  Location: [facility] OR;  Service: Open Heart Surgery;  Laterality: Left;   EYE SURGERY Left    laser surgery   FINE NEEDLE ASPIRATION  08/21/2021   Procedure: FINE NEEDLE ASPIRATION (FNA) LINEAR;  Surgeon: Brenna Adine CROME, DO;  Location: [facility] ENDOSCOPY;  Service: Pulmonary;;   INCISE AND DRAIN ABCESS  ; 04/26/11; 08/13/11   right breast   INCISION AND DRAINAGE ABSCESS Right 05/09/2012   Procedure: INCISION AND DRAINAGE RIGHT BREAST ABSCESS;  Surgeon: Donnice POUR. Belinda, MD;  Location: [facility] OR;  Service: General;  Laterality: Right;   INCISION AND DRAINAGE ABSCESS Right 02/08/2014   Procedure: INCISION AND DRAINAGE RIGHT BREAST ABSCESS;  Surgeon: Donnice Belinda, MD;  Location: [facility] OR;  Service: General;  Laterality: Right;  INCISION AND DRAINAGE ABSCESS Right 06/14/2014   Procedure: INCISION AND DRAINAGE RIGHT BREAST ABSCESS;  Surgeon: Donnice Lima, MD;  Location: [facility] OR;  Service: General;  Laterality: Right;   IR IMAGING GUIDED PORT INSERTION  12/08/2021   IR PARACENTESIS  02/02/2023   IRRIGATION AND DEBRIDEMENT ABSCESS  12/19/2010   Procedure: IRRIGATION AND DEBRIDEMENT ABSCESS;  Surgeon: Donnice Bury, MD;  Location: [facility] OR;  Service: General;  Laterality: Right;   IRRIGATION AND DEBRIDEMENT ABSCESS  08/13/2011   Procedure: MINOR INCISION AND DRAINAGE OF ABSCESS;  Surgeon: Lynwood MALVA Pina, MD;  Location: [facility] OR;  Service:  General;  Laterality: Right;  Right Breast    IRRIGATION AND DEBRIDEMENT ABSCESS  11/17/2011   Procedure: IRRIGATION AND DEBRIDEMENT ABSCESS;  Surgeon: Donnice POUR. Lima, MD;  Location: [facility] OR;  Service: General;  Laterality: Right;  irrigation and debridement right recurrent breast abscess   LARYNX SURGERY     LEFT HEART CATH AND CORONARY ANGIOGRAPHY N/A 04/09/2021   Procedure: LEFT HEART CATH AND CORONARY ANGIOGRAPHY;  Surgeon: Dann Candyce RAMAN, MD;  Location: [facility] INVASIVE CV LAB;  Service: Cardiovascular;  Laterality: N/A;   LEFT HEART CATHETERIZATION WITH CORONARY ANGIOGRAM N/A 05/29/2012   Procedure: LEFT HEART CATHETERIZATION WITH CORONARY ANGIOGRAM & PTCA;  Surgeon: Debby DELENA Sor, MD;  Location: [facility] CATH LAB;  Service: Cardiovascular; Bifurcation dRCA-RPAD/PLA & rPDA  PTCA.   LEFT HEART CATHETERIZATION WITH CORONARY ANGIOGRAM N/A 11/08/2013   Procedure: LEFT HEART CATHETERIZATION WITH CORONARY ANGIOGRAM and Coronary Stent Intervention;  Surgeon: Alm LELON Clay, MD;  Location: [facility] CATH LAB;  Service: Cardiovascular; mLAD 80% (FFR 0.73) - resolute DES 3.0 x 22 mm (postdilated to 3.3 mm->3.5 mm)   NM MYOVIEW  LTD  01/26/2009   Normal study, no evidence of ischemia, EF 67%   ployp removed from voice box 03/30/12     RADIAL ARTERY HARVEST Right 06/30/2021   Procedure: RADIAL ARTERY HARVEST;  Surgeon: Shyrl Linnie MALVA, MD;  Location: [facility] OR;  Service: Open Heart Surgery;  Laterality: Right;   REFRACTIVE SURGERY  ~ 2010   right   TEE WITHOUT CARDIOVERSION N/A 06/30/2021   Procedure: TRANSESOPHAGEAL ECHOCARDIOGRAM (TEE);  Surgeon: Shyrl Linnie MALVA, MD;  Location: [facility] OR;  Service: Open Heart Surgery;  Laterality: N/A;   TONSILLECTOMY  1988   VIDEO BRONCHOSCOPY WITH ENDOBRONCHIAL ULTRASOUND Bilateral 08/21/2021   Procedure: VIDEO BRONCHOSCOPY WITH ENDOBRONCHIAL ULTRASOUND;  Surgeon: Brenna Adine CROME, DO;  Location: [facility] ENDOSCOPY;  Service: Pulmonary;  Laterality: Bilateral;     Allergies: Amoxil  [amoxicillin ], Chantix [varenicline], Suboxone [buprenorphine hcl-naloxone hcl], and Emend [fosaprepitant  dimeglumine]  Medications: Prior to Admission medications   Medication Sig Start Date End Date Taking? Authorizing Provider  aspirin  EC 81 MG tablet Take 81 mg by mouth daily.    [provider]  atorvastatin  (LIPITOR) 40 MG tablet TAKE 1 TABLET (40 MG TOTAL) BY MOUTH DAILY. 04/23/22   Espinoza, Alejandra, DO  busPIRone  (BUSPAR ) 15 MG tablet Take 0.5 tablets (7.5 mg total) by mouth 2 (two) times daily. 05/20/22   Espinoza, Alejandra, DO  Carboxymethylcellul-Glycerin (LUBRICATING EYE DROPS OP) Place 1 drop into both eyes daily as needed (dry eyes).    [provider]  carvedilol  (COREG ) 12.5 MG tablet Take 1 tablet (12.5 mg total) by mouth 2 (two) times daily with a meal. 10/01/22   Sherrill Cable Latif, DO  diclofenac  Sodium (VOLTAREN ) 1 % GEL Apply 1 application topically 3 times daily as needed for pain 12/28/22   Nicholas Bar, MD  docusate  sodium (COLACE) 100 MG capsule Take 1 capsule (100 mg total) by mouth daily. 11/22/22   Nicholas Bar, MD  DULoxetine  (CYMBALTA ) 30 MG capsule Take 1 capsule (30 mg total) by mouth daily. 06/23/22   Leigh Venetia CROME, MD  fentaNYL  (DURAGESIC ) 12 MCG/HR Place 1 patch onto the skin every 3 (three) days. 02/09/23   Pickenpack-Cousar, Athena N, NP  ferrous sulfate  325 (65 FE) MG tablet Take 1 tablet (325 mg total) by mouth daily with breakfast. 11/22/22   Nicholas Bar, MD  fluticasone  (FLONASE ) 50 MCG/ACT nasal spray Place 1 spray into both nostrils daily as needed (congestion). 11/22/22   Nicholas Bar, MD  folic acid  (FOLVITE ) 1 MG tablet TAKE 1 TABLET (1 MG TOTAL) BY MOUTH DAILY. 12/20/22   Sherrod Sherrod, MD  furosemide  (LASIX ) 20 MG tablet Take 1 tablet (20 mg total) by mouth daily. 01/14/23   Dahbura, Anton, DO  glucose blood (ACCU-CHEK AVIVA PLUS) test strip Use as instructed 01/24/23   Nicholas Bar, MD   hyoscyamine  (LEVSIN  SL) 0.125 MG SL tablet Place 1 tablet (0.125 mg total) under the tongue every 6 (six) hours as needed. For abdominal cramping 03/10/22   Heilingoetter, Cassandra L, PA-C  insulin  glargine (LANTUS ) 100 UNIT/ML Solostar Pen Inject 24 Units into the skin every morning. 05/03/22   Espinoza, Alejandra, DO  Insulin  Pen Needle 32G X 4 MM MISC 1 Needle by Does not apply route in the morning and at bedtime. 02/02/22   Espinoza, Alejandra, DO  ipratropium (ATROVENT  HFA) 17 MCG/ACT inhaler Inhale 2 puffs into the lungs every 4 (four) hours as needed for wheezing (COPD).    [provider]  lactulose  (CHRONULAC ) 10 GM/15ML solution Take 15 mLs (10 g total) by mouth daily as needed for moderate constipation or severe constipation. 01/28/23   Shitarev, Dimitry, MD  lidocaine -prilocaine  (EMLA ) cream APPLY 1 APPLICATION TOPICALLY AS NEEDED. 02/01/22   Heilingoetter, Cassandra L, PA-C  LORazepam  (ATIVAN ) 0.5 MG tablet Take 1 tablet (0.5 mg total) by mouth every 8 (eight) hours as needed for anxiety (nausea). 01/19/23   Pickenpack-Cousar, Athena N, NP  metoCLOPramide  (REGLAN ) 10 MG tablet Take 1 tablet (10 mg total) by mouth every 8 (eight) hours as needed for nausea. 10/21/22   Pickenpack-Cousar, Athena N, NP  mometasone -formoterol  (DULERA ) 100-5 MCG/ACT AERO Inhale 2 puffs into the lungs in the morning and at bedtime. 11/22/22   Nicholas Bar, MD  nicotine  polacrilex (NICORETTE ) 2 MG gum Take 1 each (2 mg total) by mouth as needed for smoking cessation. 06/03/21   Espinoza, Alejandra, DO  ondansetron  (ZOFRAN -ODT) 8 MG disintegrating tablet Take 1 tablet (8 mg total) by mouth every 8 (eight) hours as needed for nausea or vomiting. Patient not taking: Reported on 01/28/2023 01/28/22   Heilingoetter, Cassandra L, PA-C  oxyCODONE  (ROXICODONE ) 15 MG immediate release tablet Take 1-2 tablets (15-30 mg total) by mouth every 4 (four) hours as needed for pain. 02/09/23   Pickenpack-Cousar, Athena N, NP   pantoprazole  (PROTONIX ) 40 MG tablet Take 1 tablet (40 mg total) by mouth 2 (two) times daily. 06/23/22   Zehr, Jessica D, PA-C  polyethylene glycol powder (GAVILAX) 17 GM/SCOOP powder Take 17 g by mouth daily. 11/22/22   Nicholas Bar, MD  prochlorperazine  (COMPAZINE ) 10 MG tablet TAKE 1 TABLET (10 MG TOTAL) BY MOUTH EVERY 6 (SIX) HOURS AS NEEDED FOR NAUSEA OR VOMITING. Patient not taking: Reported on 01/28/2023 05/12/22   Heilingoetter, Cassandra L, PA-C  spironolactone  (ALDACTONE ) 50 MG tablet Take 1 tablet (  50 mg total) by mouth at bedtime. 01/14/23   Dahbura, Anton, DO  valACYclovir  (VALTREX ) 1000 MG tablet Take for 3 days prn each outbreak. Patient taking differently: Take 1,000 mg by mouth daily. Take for 3 days prn each outbreak. 07/08/22   Rudy Carlin LABOR, MD     Family History  Problem Relation Age of Onset   Esophageal cancer Mother    COPD Mother    Cancer Mother        lymphoma   Heart disease Mother    Stomach cancer Mother    Hyperlipidemia Father    Heart disease Father    Cancer Maternal Aunt        breast, colon   Crohn's disease Maternal Aunt    Hypertension Maternal Grandmother    Diabetes Maternal Grandmother    Hypertension Brother    Rectal cancer Neg Hx    Liver cancer Neg Hx    Colon cancer Neg Hx    Breast cancer Neg Hx     Social History   Socioeconomic History   Marital status: Single    Spouse name: Not on file   Number of children: 1   Years of education: Not on file   Highest education level: Not on file  Occupational History   Occupation: homemaker   Occupation: disabled  Tobacco Use   Smoking status: Former    Current packs/day: 0.50    Average packs/day: 0.5 packs/day for 44.1 years (22.0 ttl pk-yrs)    Types: Cigarettes    Start date: 01/12/1979   Smokeless tobacco: Never   Tobacco comments:    PT STATES THAT SHE CHEWS THE GUM FOR SMOKING AND NO LONGER SMOKES  Vaping Use   Vaping status: Never Used  Substance and Sexual Activity    Alcohol  use: No    Alcohol /week: 0.0 standard drinks of alcohol     Comment: recovering addict; 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   Social Drivers of Corporate Investment Banker Strain: High Risk (09/08/2021)   Overall Financial Resource Strain (CARDIA)    Difficulty of Paying Living Expenses: Very hard  Food Insecurity: No Food Insecurity (11/16/2022)   Hunger Vital Sign    Worried About Running Out of Food in the Last Year: Never true    Ran Out of Food in the Last Year: Never true  Transportation Needs: No Transportation Needs (11/16/2022)   PRAPARE - Administrator, Civil Service (Medical): No    Lack of Transportation (Non-Medical): No  Physical Activity: Not on file  Stress: Not on file  Social Connections: Not on file    Review of Systems: A 12 point ROS discussed and pertinent positives are indicated in the HPI above.  All other systems are negative.  Review of Systems  Constitutional:  Positive for appetite change and fatigue.  Respiratory:  Negative for cough and shortness of breath.   Cardiovascular:  Positive for leg swelling. Negative for chest pain.  Gastrointestinal:  Positive for abdominal distention and  abdominal pain. Negative for diarrhea, nausea and vomiting.  Musculoskeletal:  Positive for back pain.  Neurological:  Negative for dizziness and headaches.    Vital Signs: LMP 06/12/2016 (Approximate)   Physical Exam Constitutional:      General: She is not in acute distress.    Appearance: She is not  ill-appearing.  HENT:     Mouth/Throat:     Mouth: Mucous membranes are moist.     Pharynx: Oropharynx is clear.  Cardiovascular:     Pulses: Normal pulses.  Pulmonary:     Effort: Pulmonary effort is normal.  Abdominal:     General: There is distension.     Tenderness: There is abdominal tenderness.  Musculoskeletal:     Right lower leg: Edema present.     Left lower leg: Edema present.  Skin:    General: Skin is warm and dry.  Neurological:     Mental Status: She is alert and oriented to person, place, and time.     Imaging: IR Paracentesis Result Date: 02/02/2023 INDICATION: 58 year old female with stage IV lung cancer, liver metastasis, recurrent ascites. Request for diagnostic and therapeutic paracentesis EXAM: ULTRASOUND GUIDED DIAGNOSTIC AND THERAPEUTIC PARACENTESIS MEDICATIONS: 10 mL 1% lidocaine  COMPLICATIONS: None immediate. PROCEDURE: Informed written consent was obtained from the patient after a discussion of the risks, benefits and alternatives to treatment. A timeout was performed prior to the initiation of the procedure. Initial ultrasound scanning demonstrates a large amount of ascites within the right lower abdominal quadrant. The right lower abdomen was prepped and draped in the usual sterile fashion. 1% lidocaine  was used for local anesthesia. Following this, a 19 gauge, 7-cm, Yueh catheter was introduced. An ultrasound image was saved for documentation purposes. The paracentesis was performed. The catheter was removed and a dressing was applied. The patient tolerated the procedure well without immediate post procedural complication. Patient received post-procedure intravenous albumin ; see nursing notes for details. FINDINGS: A total of approximately 6.0 liters of cloudy, chylous-appearing fluid was removed. Samples were sent to the laboratory as requested by the clinical team. IMPRESSION: Successful ultrasound-guided paracentesis yielding 6.0 liters of peritoneal fluid.  Performed by: Kacie Matthews PA-C Electronically Signed   By: Ester Sides M.D.   On: 02/02/2023 14:34   US  Paracentesis Result Date: 01/20/2023 INDICATION: Patient with history of right lung adenocarcinoma, recurrent ascites; request received for therapeutic paracentesis up to 5 liters EXAM: ULTRASOUND GUIDED THERAPEUTIC  PARACENTESIS MEDICATIONS: 8 mL 1% lidocaine  COMPLICATIONS: None immediate. PROCEDURE: Informed written consent was obtained from the patient after a discussion of the risks, benefits and alternatives to treatment. A timeout was performed prior to the initiation of the procedure. Initial ultrasound scanning demonstrates a large amount of ascites within the right mid to lower abdominal quadrant. The right mid to lower abdomen was prepped and draped in the usual sterile fashion. 1% lidocaine  was used for local anesthesia. Following this, a 19 gauge, 10-cm, Yueh catheter was introduced. An ultrasound image was saved for documentation purposes. The paracentesis was performed. The catheter was removed and a dressing was applied. The patient tolerated the procedure well without immediate post procedural complication. FINDINGS: A total of approximately 5 liters of milky/chylous fluid was removed. IMPRESSION: Successful ultrasound-guided therapeutic paracentesis yielding 5 liters of peritoneal fluid. Performed by: Kevin Allred,PA-C Electronically Signed   By: Wilkie Lent M.D.   On: 01/20/2023 12:13    Labs:  CBC: Recent Labs    10/06/22 0942 10/29/22 1637 12/31/22 0846 01/19/23 1026  WBC 5.8 6.3 4.4 5.3  HGB 7.7* 11.1* 9.8* 11.0*  HCT 22.4* 32.3* 28.3* 32.0*  PLT 316 194 327 334    COAGS: No results for input(s): INR, APTT in the last 8760 hours.  BMP: Recent Labs    10/06/22 0942 10/29/22 1637 12/31/22 0846 01/19/23 1026  NA 135 135 139 137  K 3.8 3.4* 3.6 3.7  CL 104 106 107 105  CO2 27 22 25 26   GLUCOSE 164* 94 125* 99  BUN 16 12 13 15   CALCIUM  8.1* 8.7*  8.7* 8.4*  CREATININE 1.25* 0.93 0.96 1.04*  GFRNONAA 50* >60 >60 >60    LIVER FUNCTION TESTS: Recent Labs    10/06/22 0942 10/29/22 1637 12/31/22 0846 01/19/23 1026  BILITOT 0.4 0.6 0.5 0.6  AST 22 18 11* 10*  ALT 13 6 <5 <5  ALKPHOS 71 71 66 71  PROT 6.2* 6.9 6.5 6.5  ALBUMIN  3.1* 3.1* 3.1* 3.0*    TUMOR MARKERS: No results for input(s): AFPTM, CEA, CA199, CHROMGRNA in the last 8760 hours.  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 [place], 58 year old female, presents today to the [facility] Interventional Radiology department for an image-guided tunneled peritoneal catheter placement.   Risks and benefits discussed with the patient including bleeding, infection, damage to adjacent structures, malfunction of the catheter with need for additional procedures.  All of the patient's questions were answered, patient is agreeable to proceed. She has been NPO. She is a full code.   Consent signed and in chart.  Thank you for this interesting consult.  I greatly enjoyed meeting JANNICE BEITZEL and look forward to participating in their care.  A copy of this report was sent to the requesting provider on this date.  Electronically Signed: Warren Dais, AGACNP-BC 02/16/2023, 11:32 AM   I spent a total of  30 Minutes   in face to face in clinical consultation, greater than 50% of which was counseling/coordinating care for recurrent malignant ascites.

## 2023-02-16 ENCOUNTER — Other Ambulatory Visit: Payer: Self-pay

## 2023-02-16 ENCOUNTER — Ambulatory Visit (HOSPITAL_COMMUNITY)
Admission: RE | Admit: 2023-02-16 | Discharge: 2023-02-16 | Disposition: A | Discharge status: Home / Self Care | Payer: Medicaid Other | Source: Ambulatory Visit | Attending: Family Medicine | Admitting: Family Medicine

## 2023-02-16 DIAGNOSIS — Z923 Personal history of irradiation: Secondary | ICD-10-CM | POA: Diagnosis not present

## 2023-02-16 DIAGNOSIS — M797 Fibromyalgia: Secondary | ICD-10-CM | POA: Diagnosis not present

## 2023-02-16 DIAGNOSIS — C3490 Malignant neoplasm of unspecified part of unspecified bronchus or lung: Secondary | ICD-10-CM | POA: Diagnosis not present

## 2023-02-16 DIAGNOSIS — D573 Sickle-cell trait: Secondary | ICD-10-CM | POA: Insufficient documentation

## 2023-02-16 DIAGNOSIS — I10 Essential (primary) hypertension: Secondary | ICD-10-CM | POA: Diagnosis not present

## 2023-02-16 DIAGNOSIS — Z794 Long term (current) use of insulin: Secondary | ICD-10-CM | POA: Diagnosis not present

## 2023-02-16 DIAGNOSIS — E119 Type 2 diabetes mellitus without complications: Secondary | ICD-10-CM | POA: Diagnosis not present

## 2023-02-16 DIAGNOSIS — Z9221 Personal history of antineoplastic chemotherapy: Secondary | ICD-10-CM | POA: Insufficient documentation

## 2023-02-16 DIAGNOSIS — G893 Neoplasm related pain (acute) (chronic): Secondary | ICD-10-CM | POA: Insufficient documentation

## 2023-02-16 DIAGNOSIS — J449 Chronic obstructive pulmonary disease, unspecified: Secondary | ICD-10-CM | POA: Insufficient documentation

## 2023-02-16 DIAGNOSIS — R180 Malignant ascites: Secondary | ICD-10-CM | POA: Insufficient documentation

## 2023-02-16 DIAGNOSIS — Z87891 Personal history of nicotine dependence: Secondary | ICD-10-CM | POA: Insufficient documentation

## 2023-02-16 DIAGNOSIS — R188 Other ascites: Secondary | ICD-10-CM

## 2023-02-16 HISTORY — PX: IR PERC TUN PERIT CATH WO PORT S&I /IMAG: IMG2327

## 2023-02-16 LAB — GLUCOSE, CAPILLARY
Glucose-Capillary: 83 mg/dL (ref 70–99)
Glucose-Capillary: 171 mg/dL — ABNORMAL HIGH (ref 70–99)
Glucose-Capillary: 73 mg/dL (ref 70–99)

## 2023-02-16 LAB — PROTIME-INR
INR: 1 (ref 0.8–1.2)
Prothrombin Time: 12.9 s (ref 11.4–15.2)

## 2023-02-16 MED ORDER — VANCOMYCIN HCL IN DEXTROSE 1-5 GM/200ML-% IV SOLN
1000.0000 mg | INTRAVENOUS | Status: AC
  Administered 2023-02-16: 1000 mg via INTRAVENOUS

## 2023-02-16 MED ORDER — LIDOCAINE-EPINEPHRINE 1 %-1:100000 IJ SOLN
INTRAMUSCULAR | Status: AC
  Filled 2023-02-16: qty 1

## 2023-02-16 MED ORDER — MIDAZOLAM HCL 2 MG/2ML IJ SOLN
INTRAMUSCULAR | Status: AC
Start: 2023-02-16 — End: ?
  Filled 2023-02-16: qty 2

## 2023-02-16 MED ORDER — DEXTROSE 50 % IV SOLN
INTRAVENOUS | Status: AC
  Administered 2023-02-16: 25 mL via INTRAVENOUS
  Filled 2023-02-16: qty 50

## 2023-02-16 MED ORDER — HEPARIN SOD (PORK) LOCK FLUSH 100 UNIT/ML IV SOLN: 500.0000 [IU] | Freq: Once | INTRAVENOUS | Status: DC

## 2023-02-16 MED ORDER — HEPARIN SOD (PORK) LOCK FLUSH 100 UNIT/ML IV SOLN
INTRAVENOUS | Status: AC
  Administered 2023-02-16: 500 [IU] via INTRAVENOUS
  Filled 2023-02-16: qty 5

## 2023-02-16 MED ORDER — MIDAZOLAM HCL 2 MG/2ML IJ SOLN
INTRAMUSCULAR | Status: AC | PRN
  Administered 2023-02-16: 1 mg via INTRAVENOUS
  Administered 2023-02-16: .5 mg via INTRAVENOUS

## 2023-02-16 MED ORDER — FENTANYL CITRATE (PF) 100 MCG/2ML IJ SOLN
INTRAMUSCULAR | Status: AC | PRN
  Administered 2023-02-16: 50 ug via INTRAVENOUS
  Administered 2023-02-16: 25 ug via INTRAVENOUS

## 2023-02-16 MED ORDER — LEVOFLOXACIN IN D5W 500 MG/100ML IV SOLN: 500.0000 mg | Freq: Once | INTRAVENOUS | Status: DC

## 2023-02-16 MED ORDER — DEXTROSE 50 % IV SOLN: 25.0000 mL | Freq: Once | INTRAVENOUS | Status: AC

## 2023-02-16 MED ORDER — SODIUM CHLORIDE 0.9% FLUSH: 5.0000 mL | Freq: Three times a day (TID) | INTRAVENOUS | Status: DC

## 2023-02-16 MED ORDER — VANCOMYCIN HCL IN DEXTROSE 1-5 GM/200ML-% IV SOLN
INTRAVENOUS | Status: AC
  Filled 2023-02-16: qty 200

## 2023-02-16 MED ORDER — FENTANYL CITRATE (PF) 100 MCG/2ML IJ SOLN
INTRAMUSCULAR | Status: AC
  Filled 2023-02-16: qty 2

## 2023-02-16 NOTE — Procedures (Signed)
 Interventional Radiology Procedure Note  Procedure: Tunneled peritoneal drainage catheter  Findings: Please refer to procedural dictation for full description. RLQ tunneled peritoneal catheter, 7.4 L ascitic fluid drained.  Complications: None immediate  Estimated Blood Loss: <5 mL  Recommendations: RLQ tunneled peritoneal drainage catheter, ready for immediate use.   Ester Sides, MD

## 2023-02-17 LAB — CYTOLOGY - NON PAP

## 2023-02-21 NOTE — Progress Notes (Signed)
Palliative Medicine [facility]  [phone] [phone]   Name: Cassidy [name] Date: 02/21/2023 MRN: [id number]  DOB: October 10, 1965  Patient Care Team: [name], MD as PCP - General (Family Medicine) O'Neal, Ronnald Ramp, MD as PCP - Cardiology (Cardiology) Emelia Loron, MD as Consulting Physician (General Surgery) Pickenpack-Cousar, Arty Baumgartner, NP as Nurse Practitioner (Hospice and Palliative Medicine) Marcos Eke, RN as Registered Nurse [facility]   I connected with ORLANDO THALMANN on 02/21/23 at 11:50 AM EST by phone and verified that I am speaking with the correct person using two identifiers.   I discussed the limitations, risks, security and privacy concerns of performing an evaluation and management service by telemedicine and the availability of in-person appointments. I also discussed with the patient that there may be a patient responsible charge related to this service. The patient expressed understanding and agreed to proceed.   Other persons participating in the visit and their role in the encounter: n/a   Patient's location: home  Provider's location: [facility]   Chief Complaint: f/u of symptom management    INTERVAL HISTORY: Cassidy [name] is a 58 y.o. 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  Palliative ask to see for symptom management and goals of care.   SOCIAL HISTORY:    Ms. Titsworth reports that she has quit smoking. Her smoking use included cigarettes. She started smoking about 44 years ago. She has a 22.1 pack-year smoking history. She has never used smokeless tobacco. She reports that she does not currently use drugs. She reports that she does not drink alcohol.  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  s/p PCTA to dRCA and ostial RPAV-PLA vessel + PDA branch (05/2012)  b. [place] s/p DES to mLAD - Resolute DES 3.0 x 22 (3.16mm -->3.3 mm)   Bipolar depression (HCC)    Breast abscess    a. right side.    COPD (chronic obstructive pulmonary disease) (HCC)    Depression    Diabetic peripheral neuropathy (HCC)    Fibromyalgia    Genital warts    GERD (gastroesophageal reflux disease)    History of hiatal hernia    HLD (hyperlipidemia)    Hypertension    Lung cancer (HCC)    Pain in limb    a. LE VENOUS DUPLEX, 02/05/2009 - no evidence of deep vein thrombosis, Baker's cyst   Pneumonia    PTSD (post-traumatic stress disorder)    Sickle cell trait (HCC)    Tobacco abuse    Tooth caries    Type II diabetes mellitus (HCC)     ALLERGIES:  is allergic to amoxil [amoxicillin], chantix [varenicline], suboxone [buprenorphine hcl-naloxone hcl], and emend [fosaprepitant dimeglumine].  MEDICATIONS:  Current Outpatient Medications  Medication Sig Dispense Refill   aspirin EC 81 MG tablet Take 81 mg by mouth daily.     atorvastatin (LIPITOR) 40 MG tablet TAKE 1 TABLET (40 MG TOTAL) BY MOUTH DAILY. 90 tablet 3   busPIRone (BUSPAR) 15 MG tablet Take 0.5 tablets (7.5 mg total)  by mouth 2 (two) times daily. 60 tablet 3   Carboxymethylcellul-Glycerin (LUBRICATING EYE DROPS OP) Place 1 drop into both eyes daily as needed (dry eyes).     carvedilol (COREG) 12.5 MG tablet Take 1 tablet (12.5 mg total) by mouth 2 (two) times daily with a meal. 180 tablet 1   diclofenac Sodium (VOLTAREN) 1 % GEL Apply 1 application topically 3 times daily as needed for pain 200 g 1   docusate sodium (COLACE) 100 MG capsule Take 1 capsule (100 mg total) by mouth  daily. 10 capsule 0   DULoxetine (CYMBALTA) 30 MG capsule Take 1 capsule (30 mg total) by mouth daily. 30 capsule 5   fentaNYL (DURAGESIC) 12 MCG/HR Place 1 patch onto the skin every 3 (three) days. 10 patch 0   ferrous sulfate 325 (65 FE) MG tablet Take 1 tablet (325 mg total) by mouth daily with breakfast. 30 tablet 2   fluticasone (FLONASE) 50 MCG/ACT nasal spray Place 1 spray into both nostrils daily as needed (congestion). 16 g 1   folic acid (FOLVITE) 1 MG tablet TAKE 1 TABLET (1 MG TOTAL) BY MOUTH DAILY. 30 tablet 3   furosemide (LASIX) 20 MG tablet Take 1 tablet (20 mg total) by mouth daily.     glucose blood (ACCU-CHEK AVIVA PLUS) test strip Use as instructed 50 each 1   hyoscyamine (LEVSIN SL) 0.125 MG SL tablet Place 1 tablet (0.125 mg total) under the tongue every 6 (six) hours as needed. For abdominal cramping 30 tablet 0   insulin glargine (LANTUS) 100 UNIT/ML Solostar Pen Inject 24 Units into the skin every morning. 3 mL 11   Insulin Pen Needle 32G X 4 MM MISC 1 Needle by Does not apply route in the morning and at bedtime. 120 each 2   ipratropium (ATROVENT HFA) 17 MCG/ACT inhaler Inhale 2 puffs into the lungs every 4 (four) hours as needed for wheezing (COPD).     lactulose (CHRONULAC) 10 GM/15ML solution Take 15 mLs (10 g total) by mouth daily as needed for moderate constipation or severe constipation. 236 mL 0   lidocaine-prilocaine (EMLA) cream APPLY 1 APPLICATION TOPICALLY AS NEEDED. 30 g 2   LORazepam (ATIVAN) 0.5 MG tablet Take 1 tablet (0.5 mg total) by mouth every 8 (eight) hours as needed for anxiety (nausea). 30 tablet 0   metoCLOPramide (REGLAN) 10 MG tablet Take 1 tablet (10 mg total) by mouth every 8 (eight) hours as needed for nausea. 60 tablet 2   mometasone-formoterol (DULERA) 100-5 MCG/ACT AERO Inhale 2 puffs into the lungs in the morning and at bedtime. 13 g 3   nicotine polacrilex (NICORETTE) 2 MG gum Take 1 each (2 mg total) by mouth as needed for smoking  cessation. 100 tablet 0   ondansetron (ZOFRAN-ODT) 8 MG disintegrating tablet Take 1 tablet (8 mg total) by mouth every 8 (eight) hours as needed for nausea or vomiting. (Patient not taking: Reported on 01/28/2023) 30 tablet 2   oxyCODONE (ROXICODONE) 15 MG immediate release tablet Take 1-2 tablets (15-30 mg total) by mouth every 4 (four) hours as needed for pain. 90 tablet 0   pantoprazole (PROTONIX) 40 MG tablet Take 1 tablet (40 mg total) by mouth 2 (two) times daily. 60 tablet 5   polyethylene glycol powder (GAVILAX) 17 GM/SCOOP powder Take 17 g by mouth daily. 850 g 1   prochlorperazine (COMPAZINE) 10 MG tablet TAKE 1 TABLET (10 MG TOTAL) BY MOUTH EVERY 6 (SIX) HOURS AS NEEDED FOR NAUSEA  OR VOMITING. (Patient not taking: Reported on 01/28/2023) 30 tablet 0   spironolactone (ALDACTONE) 50 MG tablet Take 1 tablet (50 mg total) by mouth at bedtime. 90 tablet 0   valACYclovir (VALTREX) 1000 MG tablet Take for 3 days prn each outbreak. (Patient taking differently: Take 1,000 mg by mouth daily. Take for 3 days prn each outbreak.) 30 tablet 11   No current facility-administered medications for this visit.    VITAL SIGNS: LMP 06/12/2016 (Approximate)  There were no vitals filed for this visit.  Estimated body mass index is 27.1 kg/m as calculated from the following:   Height as of 02/16/23: 5\' 7"  (1.702 m).   Weight as of 02/16/23: 173 lb (78.5 kg).   PERFORMANCE STATUS (ECOG) : 1 - Symptomatic but completely ambulatory  Discussed the use of AI scribe software for clinical note transcription with the patient, who gave verbal consent to proceed.   IMPRESSION: .  I connected with Ms. [name] via phone for symptom management follow-up. She states she is taking things one day at a time. Ongoing fatigue. Denies nausea, vomiting, constipation, or diarrhea. She is concerned about issues related to her Plurex Catheter.  She has been experiencing issues with her Plurex Catheter, which was recently  placed under the discretion of her PCP. There has been no fluid flow since leaving the hospital, despite attempts to manually stimulate drainage by running her fingers down the catheter three times. She notes the presence of fluid in her abdomen which is causing her discomfort but is unable to achieve any drainage. She is also reporting she does not have required home supply management. Orders were received and with assistance from PCP have been faxed and received and by [facility].   She is currently managing her pain with Fentanyl patch, which she finds effective in addition to oxycodone as needed for breakthrough. Much appreciative of improvement. No changes at this time.   We will continue to closely support and follow for needs. All questions answered and support provided.  Goals of Care  Ms. Lusty continues to be clear in her expressed goals to treat the treatable while aggressively managing her symptoms. She desires not to be in a suffering state as her quality of life is important to her. She is not ready to consider hospice care at this time.   01/19/23: I empathetically approached discussions regarding goals of care, quality of life, and healthcare limitations. Although patient's recent scans do not show significant cancer progression as discussed with Cassie, PA Ms. Rissmiller's quality of life is clearly poor and symptom burden is high. She is realistic in her understanding of her current cancer state in addition to other co-morbidities. Donelda is clear in her expressed wishes that her quality of life is most important to her and not being in a suffering state. She is tearful sharing she knows that time is limited. The patient has began preparing her son for this however he is not accepting at this time as the only child. Aydia has spoken with this girlfriend who acknowledges her decline and is assisting in preparing patient's son for the inevitable. Emotional support provided.   Sadonna states  she has no desire for her life to be prolonged if there is no quality. We discussed her full code status with consideration of her current illness. She shares desires for a natural death and her life to not be prolonged by life-sustaining measures including CPR or intubation. Education provided on DNR/DNI. She expressed understanding  and agreement with the concept due to her deteriorating health status. She also would not want her family to struggle with such decisions. MOST form introduced and education provided on documents. The patient would like to take form home and review hoping to gain support from her son. She is interested in completing at follow-up visit. Would not wish for artificial nutrition.   Education provided on hospice's goals and philosophy of care. We discussed what care would look like in the home as this would be her desire. She understands this support will be needed in the future however at this time she is not read to transition her care to focus on end-of-life. States she has to be at peace specifically knowing that her son is understanding and prepared. She is also not ready to discontinue support at the cancer center or from her providers. Amea would look like to continue taking things one day at at time for as long as she can.  The patient has also been experiencing financial difficulties, considering selling her car due to infrequent use and financial strain. Despite these challenges, the patient is proactive in managing her health, regularly checking her medical charts and maintaining communication with her healthcare team.  Support has been provided and all questions answered. Patient knows to contact office as needed.  We discussed the importance of continued conversation with family and their medical providers regarding overall plan of care and treatment options, ensuring decisions are within the context of the patients values and GOCs.  Assessment and Plan  PleurX  Catheter Newly placed catheter under orders from PCP not draining. Possible internal kinking or obstruction. Reports no home equipment.  -Contact Radiology for evaluation and possible intervention. -Orders sent to [facility] per PCP.   Cancer Related Pain Management Pain controlled with current regimen of transdermal patches and oxycodone. -Continue current regimen. -Oxycodone 15mg  every 4 hours as needed  -Fentanyl patch -Next patch due on 03/11/2023.  Follow-up Port flush scheduled for 03/16/2023. I will see patient prior to flush. Sooner if needed.  -See patient at 1:00 PM on 03/16/2023 prior to port flush.  Goals of Care Discussed the benefits of hospice care and the implications of a Do Not Resuscitate (DNR) order. -Consider hospice care for additional support at home. -Discuss DNR order with patient's brother, who will be the guardian. -Patient has advanced directives with son and brother listed as HCPOA.   Patient expressed understanding and was in agreement with this plan. She also understands that She can call the clinic at any time with any questions, concerns, or complaints.    Any controlled substances utilized were prescribed in the context of palliative care. PDMP has been reviewed.    Visit consisted of counseling and education dealing with the complex and emotionally intense issues of symptom management and palliative care in the setting of serious and potentially life-threatening illness.  Willette Alma, AGPCNP-BC  Palliative Medicine Team/[facility]

## 2023-02-22 ENCOUNTER — Inpatient Hospital Stay: Payer: Medicaid Other | Attending: Internal Medicine | Admitting: Nurse Practitioner

## 2023-02-22 ENCOUNTER — Other Ambulatory Visit (HOSPITAL_COMMUNITY): Payer: Self-pay | Admitting: Student

## 2023-02-22 ENCOUNTER — Encounter: Payer: Self-pay | Admitting: Nurse Practitioner

## 2023-02-22 ENCOUNTER — Other Ambulatory Visit: Payer: Self-pay

## 2023-02-22 DIAGNOSIS — R630 Anorexia: Secondary | ICD-10-CM | POA: Diagnosis not present

## 2023-02-22 DIAGNOSIS — G893 Neoplasm related pain (acute) (chronic): Secondary | ICD-10-CM | POA: Diagnosis not present

## 2023-02-22 DIAGNOSIS — Z515 Encounter for palliative care: Secondary | ICD-10-CM

## 2023-02-22 DIAGNOSIS — R530 Neoplastic (malignant) related fatigue: Secondary | ICD-10-CM

## 2023-02-22 DIAGNOSIS — C3491 Malignant neoplasm of unspecified part of right bronchus or lung: Secondary | ICD-10-CM | POA: Diagnosis not present

## 2023-02-22 DIAGNOSIS — Z7189 Other specified counseling: Secondary | ICD-10-CM

## 2023-02-22 DIAGNOSIS — R188 Other ascites: Secondary | ICD-10-CM

## 2023-02-23 ENCOUNTER — Other Ambulatory Visit: Payer: Self-pay | Admitting: Family Medicine

## 2023-02-23 ENCOUNTER — Ambulatory Visit (HOSPITAL_COMMUNITY)
Admission: RE | Admit: 2023-02-23 | Discharge: 2023-02-23 | Disposition: A | Discharge status: Home / Self Care | Payer: Medicaid Other | Source: Ambulatory Visit | Attending: Student | Admitting: Student

## 2023-02-23 DIAGNOSIS — R188 Other ascites: Secondary | ICD-10-CM | POA: Insufficient documentation

## 2023-02-23 DIAGNOSIS — J45909 Unspecified asthma, uncomplicated: Secondary | ICD-10-CM

## 2023-02-23 DIAGNOSIS — R180 Malignant ascites: Secondary | ICD-10-CM | POA: Diagnosis not present

## 2023-02-23 DIAGNOSIS — C801 Malignant (primary) neoplasm, unspecified: Secondary | ICD-10-CM | POA: Diagnosis not present

## 2023-02-23 HISTORY — PX: IR PARACENTESIS: IMG2679

## 2023-03-01 ENCOUNTER — Ambulatory Visit: Payer: Medicaid Other | Admitting: Family Medicine

## 2023-03-03 ENCOUNTER — Other Ambulatory Visit: Payer: Self-pay | Admitting: Nurse Practitioner

## 2023-03-03 DIAGNOSIS — G893 Neoplasm related pain (acute) (chronic): Secondary | ICD-10-CM

## 2023-03-03 DIAGNOSIS — Z515 Encounter for palliative care: Secondary | ICD-10-CM

## 2023-03-03 DIAGNOSIS — C3491 Malignant neoplasm of unspecified part of right bronchus or lung: Secondary | ICD-10-CM

## 2023-03-03 MED ORDER — OXYCODONE HCL 15 MG PO TABS: 15.0000 mg | ORAL_TABLET | ORAL | 0 refills | Status: DC | PRN

## 2023-03-03 MED ORDER — FENTANYL 12 MCG/HR TD PT72: 1.0000 | MEDICATED_PATCH | TRANSDERMAL | 0 refills | Status: DC

## 2023-03-04 ENCOUNTER — Ambulatory Visit: Payer: Medicaid Other | Admitting: Family Medicine

## 2023-03-05 NOTE — Progress Notes (Signed)
 Updated pemetrexed ERX to [id number]   Pryor Ochoa, PharmD 03/05/23

## 2023-03-09 ENCOUNTER — Other Ambulatory Visit: Payer: Self-pay | Admitting: Internal Medicine

## 2023-03-10 DIAGNOSIS — R188 Other ascites: Secondary | ICD-10-CM | POA: Diagnosis not present

## 2023-03-10 DIAGNOSIS — C3490 Malignant neoplasm of unspecified part of unspecified bronchus or lung: Secondary | ICD-10-CM | POA: Diagnosis not present

## 2023-03-15 NOTE — Progress Notes (Unsigned)
 Palliative Medicine [facility]  Telephone:(336) [phone] Fax:(336) [phone]   Name: Cassidy [name] Date: 03/15/2023 MRN: [id number]  DOB: September 07, 1965  Patient Care Team: [name], MD as PCP - General (Family Medicine) O'Neal, Ronnald Ramp, MD as PCP - Cardiology (Cardiology) Emelia Loron, MD as Consulting Physician (General Surgery) Pickenpack-Cousar, Arty Baumgartner, NP as Nurse Practitioner (Hospice and Palliative Medicine) Marcos Eke, RN as Registered Nurse [facility]   INTERVAL HISTORY: Cassidy [name] is a 58 y.o. 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  Palliative ask to see for symptom management and goals of care.   SOCIAL HISTORY:    Ms. Elbert reports that she has quit smoking. Her smoking use included cigarettes. She started smoking about 44 years ago. She has a 22.1 pack-year smoking history. She has never used smokeless tobacco. She reports that she does not currently use drugs. She reports that she does not drink alcohol.  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  s/p PCTA to dRCA and ostial RPAV-PLA vessel + PDA branch (05/2012)  b. [place] s/p DES to mLAD - Resolute DES 3.0 x 22 (3.37mm -->3.3 mm)   Bipolar depression (HCC)    Breast abscess    a. right side.    COPD (chronic obstructive pulmonary disease) (HCC)    Depression    Diabetic peripheral neuropathy (HCC)    Fibromyalgia    Genital warts    GERD (gastroesophageal reflux disease)    History of hiatal hernia     HLD (hyperlipidemia)    Hypertension    Lung cancer (HCC)    Pain in limb    a. LE VENOUS DUPLEX, 02/05/2009 - no evidence of deep vein thrombosis, Baker's cyst   Pneumonia    PTSD (post-traumatic stress disorder)    Sickle cell trait (HCC)    Tobacco abuse    Tooth caries    Type II diabetes mellitus (HCC)     ALLERGIES:  is allergic to amoxil [amoxicillin], chantix [varenicline], suboxone [buprenorphine hcl-naloxone hcl], and emend [fosaprepitant dimeglumine].  MEDICATIONS:  Current Outpatient Medications  Medication Sig Dispense Refill   aspirin EC 81 MG tablet Take 81 mg by mouth daily.     atorvastatin (LIPITOR) 40 MG tablet TAKE 1 TABLET (40 MG TOTAL) BY MOUTH DAILY. 90 tablet 3   busPIRone (BUSPAR) 15 MG tablet Take 0.5 tablets (7.5 mg total) by mouth 2 (two) times daily. 60 tablet 3   Carboxymethylcellul-Glycerin (LUBRICATING EYE DROPS OP) Place 1 drop into both eyes daily as needed (dry eyes).     carvedilol (COREG) 12.5 MG tablet Take 1 tablet (12.5 mg total) by mouth 2 (two) times daily with a meal. 180 tablet 1   diclofenac Sodium (VOLTAREN) 1 % GEL Apply 1 application topically 3 times daily as needed for pain 200 g 1   docusate sodium (COLACE) 100 MG capsule Take 1 capsule (100 mg total) by mouth daily. 10 capsule 0   DULoxetine (CYMBALTA) 30 MG capsule Take 1 capsule (30 mg total) by mouth daily. 30 capsule  5   fentaNYL (DURAGESIC) 12 MCG/HR Place 1 patch onto the skin every 3 (three) days. 10 patch 0   ferrous sulfate 325 (65 FE) MG tablet Take 1 tablet (325 mg total) by mouth daily with breakfast. 30 tablet 2   fluticasone (FLONASE) 50 MCG/ACT nasal spray PLACE 1 SPRAY INTO BOTH NOSTRILS DAILY AS NEEDED (CONGESTION). 16 g 1   folic acid (FOLVITE) 1 MG tablet TAKE 1 TABLET (1 MG TOTAL) BY MOUTH DAILY. 30 tablet 3   furosemide (LASIX) 20 MG tablet Take 1 tablet (20 mg total) by mouth daily.     glucose blood (ACCU-CHEK AVIVA PLUS) test strip Use as instructed 50 each 1    hyoscyamine (LEVSIN SL) 0.125 MG SL tablet Place 1 tablet (0.125 mg total) under the tongue every 6 (six) hours as needed. For abdominal cramping 30 tablet 0   insulin glargine (LANTUS) 100 UNIT/ML Solostar Pen Inject 24 Units into the skin every morning. 3 mL 11   Insulin Pen Needle 32G X 4 MM MISC 1 Needle by Does not apply route in the morning and at bedtime. 120 each 2   ipratropium (ATROVENT HFA) 17 MCG/ACT inhaler Inhale 2 puffs into the lungs every 4 (four) hours as needed for wheezing (COPD).     lactulose (CHRONULAC) 10 GM/15ML solution Take 15 mLs (10 g total) by mouth daily as needed for moderate constipation or severe constipation. 236 mL 0   lidocaine-prilocaine (EMLA) cream APPLY 1 APPLICATION TOPICALLY AS NEEDED. 30 g 2   LORazepam (ATIVAN) 0.5 MG tablet Take 1 tablet (0.5 mg total) by mouth every 8 (eight) hours as needed for anxiety (nausea). 30 tablet 0   metoCLOPramide (REGLAN) 10 MG tablet Take 1 tablet (10 mg total) by mouth every 8 (eight) hours as needed for nausea. 60 tablet 2   mometasone-formoterol (DULERA) 100-5 MCG/ACT AERO Inhale 2 puffs into the lungs in the morning and at bedtime. 13 g 3   nicotine polacrilex (NICORETTE) 2 MG gum Take 1 each (2 mg total) by mouth as needed for smoking cessation. 100 tablet 0   ondansetron (ZOFRAN-ODT) 8 MG disintegrating tablet Take 1 tablet (8 mg total) by mouth every 8 (eight) hours as needed for nausea or vomiting. (Patient not taking: Reported on 01/28/2023) 30 tablet 2   oxyCODONE (ROXICODONE) 15 MG immediate release tablet Take 1-2 tablets (15-30 mg total) by mouth every 4 (four) hours as needed for pain. 120 tablet 0   pantoprazole (PROTONIX) 40 MG tablet Take 1 tablet (40 mg total) by mouth 2 (two) times daily. 60 tablet 5   polyethylene glycol powder (GAVILAX) 17 GM/SCOOP powder Take 17 g by mouth daily. 850 g 1   prochlorperazine (COMPAZINE) 10 MG tablet TAKE 1 TABLET (10 MG TOTAL) BY MOUTH EVERY 6 (SIX) HOURS AS NEEDED FOR  NAUSEA OR VOMITING. (Patient not taking: Reported on 01/28/2023) 30 tablet 0   spironolactone (ALDACTONE) 50 MG tablet Take 1 tablet (50 mg total) by mouth at bedtime. 90 tablet 0   valACYclovir (VALTREX) 1000 MG tablet Take for 3 days prn each outbreak. (Patient taking differently: Take 1,000 mg by mouth daily. Take for 3 days prn each outbreak.) 30 tablet 11   No current facility-administered medications for this visit.    VITAL SIGNS: LMP 06/12/2016 (Approximate)  There were no vitals filed for this visit.  Estimated body mass index is 27.1 kg/m as calculated from the following:   Height as of 02/16/23: 5\' 7"  (1.702 m).  Weight as of 02/16/23: 173 lb (78.5 kg).   PERFORMANCE STATUS (ECOG) : 1 - Symptomatic but completely ambulatory  Discussed the use of AI scribe software for clinical note transcription with the patient, who gave verbal consent to proceed.   IMPRESSION: .  I connected with Ms. [name] via phone for symptom management follow-up. She states she is taking things one day at a time. Ongoing fatigue. Denies nausea, vomiting, constipation, or diarrhea. She is concerned about issues related to her Plurex Catheter.  She has been experiencing issues with her Plurex Catheter, which was recently placed under the discretion of her PCP. There has been no fluid flow since leaving the hospital, despite attempts to manually stimulate drainage by running her fingers down the catheter three times. She notes the presence of fluid in her abdomen which is causing her discomfort but is unable to achieve any drainage. She is also reporting she does not have required home supply management. Orders were received and with assistance from PCP have been faxed and received and by [facility].   She is currently managing her pain with Fentanyl patch, which she finds effective in addition to oxycodone as needed for breakthrough. Much appreciative of improvement. No changes at this time.   We will  continue to closely support and follow for needs. All questions answered and support provided.  Goals of Care  Ms. Nakamura continues to be clear in her expressed goals to treat the treatable while aggressively managing her symptoms. She desires not to be in a suffering state as her quality of life is important to her. She is not ready to consider hospice care at this time.   01/19/23: I empathetically approached discussions regarding goals of care, quality of life, and healthcare limitations. Although patient's recent scans do not show significant cancer progression as discussed with Cassie, PA Ms. Mineau's quality of life is clearly poor and symptom burden is high. She is realistic in her understanding of her current cancer state in addition to other co-morbidities. Samayra is clear in her expressed wishes that her quality of life is most important to her and not being in a suffering state. She is tearful sharing she knows that time is limited. The patient has began preparing her son for this however he is not accepting at this time as the only child. Isabelly has spoken with this girlfriend who acknowledges her decline and is assisting in preparing patient's son for the inevitable. Emotional support provided.   Marleni states she has no desire for her life to be prolonged if there is no quality. We discussed her full code status with consideration of her current illness. She shares desires for a natural death and her life to not be prolonged by life-sustaining measures including CPR or intubation. Education provided on DNR/DNI. She expressed understanding and agreement with the concept due to her deteriorating health status. She also would not want her family to struggle with such decisions. MOST form introduced and education provided on documents. The patient would like to take form home and review hoping to gain support from her son. She is interested in completing at follow-up visit. Would not wish for  artificial nutrition.   Education provided on hospice's goals and philosophy of care. We discussed what care would look like in the home as this would be her desire. She understands this support will be needed in the future however at this time she is not read to transition her care to focus on end-of-life.  States she has to be at peace specifically knowing that her son is understanding and prepared. She is also not ready to discontinue support at the cancer center or from her providers. Yarieliz would look like to continue taking things one day at at time for as long as she can.  The patient has also been experiencing financial difficulties, considering selling her car due to infrequent use and financial strain. Despite these challenges, the patient is proactive in managing her health, regularly checking her medical charts and maintaining communication with her healthcare team.  Support has been provided and all questions answered. Patient knows to contact office as needed.  We discussed the importance of continued conversation with family and their medical providers regarding overall plan of care and treatment options, ensuring decisions are within the context of the patients values and GOCs.  Assessment and Plan  PleurX Catheter Newly placed catheter under orders from PCP not draining. Possible internal kinking or obstruction. Reports no home equipment.  -Contact Radiology for evaluation and possible intervention. -Orders sent to [facility] per PCP.   Cancer Related Pain Management Pain controlled with current regimen of transdermal patches and oxycodone. -Continue current regimen. -Oxycodone 15mg  every 4 hours as needed  -Fentanyl patch -Next patch due on 03/11/2023.  Follow-up Port flush scheduled for 03/16/2023. I will see patient prior to flush. Sooner if needed.  -See patient at 1:00 PM on 03/16/2023 prior to port flush.  Goals of Care Discussed the benefits of hospice care and  the implications of a Do Not Resuscitate (DNR) order. -Consider hospice care for additional support at home. -Discuss DNR order with patient's brother, who will be the guardian. -Patient has advanced directives with son and brother listed as HCPOA.   Patient expressed understanding and was in agreement with this plan. She also understands that She can call the clinic at any time with any questions, concerns, or complaints.    Any controlled substances utilized were prescribed in the context of palliative care. PDMP has been reviewed.    Visit consisted of counseling and education dealing with the complex and emotionally intense issues of symptom management and palliative care in the setting of serious and potentially life-threatening illness.  Willette Alma, AGPCNP-BC  Palliative Medicine Team/[facility]

## 2023-03-16 ENCOUNTER — Inpatient Hospital Stay: Payer: Medicaid Other

## 2023-03-16 ENCOUNTER — Inpatient Hospital Stay: Payer: Medicaid Other | Attending: Internal Medicine | Admitting: Nurse Practitioner

## 2023-03-16 DIAGNOSIS — R530 Neoplastic (malignant) related fatigue: Secondary | ICD-10-CM

## 2023-03-16 DIAGNOSIS — R634 Abnormal weight loss: Secondary | ICD-10-CM

## 2023-03-16 DIAGNOSIS — Z7189 Other specified counseling: Secondary | ICD-10-CM | POA: Diagnosis not present

## 2023-03-16 DIAGNOSIS — G893 Neoplasm related pain (acute) (chronic): Secondary | ICD-10-CM | POA: Diagnosis not present

## 2023-03-16 DIAGNOSIS — C3491 Malignant neoplasm of unspecified part of right bronchus or lung: Secondary | ICD-10-CM | POA: Diagnosis not present

## 2023-03-16 DIAGNOSIS — Z515 Encounter for palliative care: Secondary | ICD-10-CM

## 2023-03-16 DIAGNOSIS — R630 Anorexia: Secondary | ICD-10-CM | POA: Diagnosis not present

## 2023-03-16 MED ORDER — OXYCODONE HCL 15 MG PO TABS: 15.0000 mg | ORAL_TABLET | ORAL | 0 refills | Status: DC | PRN

## 2023-03-23 ENCOUNTER — Other Ambulatory Visit: Payer: Self-pay | Admitting: Family Medicine

## 2023-03-23 ENCOUNTER — Telehealth: Payer: Self-pay

## 2023-03-23 DIAGNOSIS — I5032 Chronic diastolic (congestive) heart failure: Secondary | ICD-10-CM

## 2023-03-23 NOTE — Telephone Encounter (Signed)
 Patient calls nurse line requesting help in her home.   She reports stage 4 lung cancer and reports living alone. She reports as the disease has progressed she has found ADLs becoming more difficult.  She reports she has family support who check on her throughout the week, however she can barely make a meal or stand up for long periods of time.   Patient has medicaid and would qualify for personal care services or possible home health aide.   Patient scheduled for OV to discuss and have face to face visit for insurance purposes.

## 2023-03-24 ENCOUNTER — Telehealth: Payer: Self-pay | Admitting: Medical Oncology

## 2023-03-24 ENCOUNTER — Encounter: Payer: Self-pay | Admitting: Internal Medicine

## 2023-03-24 NOTE — Telephone Encounter (Signed)
 Falling a lot , using a walker with a seat. I am having  trouble standing up . L hip showing bruises.   Her son ,brother and future  dtr and sister in law are helping take care of her. Appt with Junita Push, MD tomorrow to discuss several issues.   She has contacted an agency to get a HHN and PCS.

## 2023-03-25 ENCOUNTER — Ambulatory Visit

## 2023-03-25 NOTE — Progress Notes (Deleted)
    SUBJECTIVE:   CHIEF COMPLAINT / HPI: PCS  Telephone call: She reports stage 4 lung cancer and reports living alone. She reports as the disease has progressed she has found ADLs becoming more difficult. She reports she has family support who check on her throughout the week, however she can barely make a meal or stand up for long periods of time.  Patient has medicaid and would qualify for personal care services or possible home health aide.  Patient scheduled for OV to discuss and have face to face visit for insurance purposes.   PERTINENT  PMH / PSH: ***  OBJECTIVE:   LMP 06/12/2016 (Approximate)   ***  ASSESSMENT/PLAN:   No problem-specific Assessment & Plan notes found for this encounter.     Levin Erp, MD [facility]

## 2023-03-29 ENCOUNTER — Telehealth: Payer: Self-pay

## 2023-03-29 ENCOUNTER — Ambulatory Visit (INDEPENDENT_AMBULATORY_CARE_PROVIDER_SITE_OTHER): Admitting: Student

## 2023-03-29 ENCOUNTER — Ambulatory Visit

## 2023-03-29 VITALS — BP 98/62 | HR 87 | Ht 67.0 in | Wt 190.4 lb

## 2023-03-29 DIAGNOSIS — R601 Generalized edema: Secondary | ICD-10-CM

## 2023-03-29 DIAGNOSIS — C3491 Malignant neoplasm of unspecified part of right bronchus or lung: Secondary | ICD-10-CM

## 2023-03-29 DIAGNOSIS — I10 Essential (primary) hypertension: Secondary | ICD-10-CM

## 2023-03-29 DIAGNOSIS — Z7189 Other specified counseling: Secondary | ICD-10-CM | POA: Diagnosis not present

## 2023-03-29 DIAGNOSIS — R188 Other ascites: Secondary | ICD-10-CM

## 2023-03-29 MED ORDER — TORSEMIDE 20 MG PO TABS: 20.0000 mg | ORAL_TABLET | Freq: Every day | ORAL | 0 refills | Status: DC

## 2023-03-29 MED ORDER — CARVEDILOL 6.25 MG PO TABS: 12.5000 mg | ORAL_TABLET | Freq: Two times a day (BID) | ORAL | 0 refills | Status: DC

## 2023-03-29 NOTE — Assessment & Plan Note (Signed)
 Patient likely with growing tumors noted on physical examination.  Will update oncology about this, see medical history for current treatment of adenocarcinoma.  We are working on keeping the patient as active as possible and trying to keep pain at a minimum.  She does have a walker that she utilizes but likely needs a bedside commode.  Additionally, filled out personal care services and provided to RN team.  I feel the patient needs personal care services frequently as she is high risk of falls and admission to the hospital.  She is unable to perform her ADLs currently and lives home alone.  Patient also needs home health physical therapy that was ordered for her.

## 2023-03-29 NOTE — Telephone Encounter (Signed)
 Patient in clinic with Dr. Jena Gauss this afternoon.   PCS forms completed during this visit.   Copy made and placed in batch scanning.   Forms will be faxed to [place] [employer].   Veronda Prude, RN

## 2023-03-29 NOTE — Patient Instructions (Addendum)
 It was great to see you today! Thank you for choosing [facility] for your primary care.  Today we addressed: We will check labs today  We will CHANGE from Lasix to Torsemide  DECREASE Carvedilol  We have ordered personal care services  Physical therapy will call to schedule as well as nursing for care management  Check pressures at home   If you haven't already, sign up for My Chart to have easy access to your labs results, and communication with your primary care physician.   Please arrive 15 minutes before your appointment to ensure smooth check in process.  We appreciate your efforts in making this happen.  Thank you for allowing me to participate in your care, Alfredo Martinez, MD 03/29/2023, 3:42 PM PGY-3, [facility]

## 2023-03-29 NOTE — Telephone Encounter (Signed)
 Patient presents to clinic today with Dr. Jena Gauss.   [place] completed forms, however, needs Dr. Donnetta Hail signature.   Placed in Dr. Donnetta Hail box for signature.   Once completed, OV note from today, 03/29/23 will need to be attached and faxed to [employer].   Veronda Prude, RN

## 2023-03-29 NOTE — Telephone Encounter (Signed)
 Done Placed in med records box

## 2023-03-29 NOTE — Assessment & Plan Note (Signed)
 Patient was offered hospital admission for IV diuresis and assistance.  However, she politely declined.  She is stable for outpatient management.  Will continue with torsemide 20 mg in addition to obtaining up-to-date CMP.  Seems that hypoalbuminemia is likely a contributor.  Discontinue Lasix at as it is not helpful for her currently.

## 2023-03-29 NOTE — Telephone Encounter (Signed)
 DME for Pleurx supplies in PCP box.   This needs to be signed by Denny Levy.  Will forward to PCP.

## 2023-03-29 NOTE — Progress Notes (Signed)
 SUBJECTIVE:   CHIEF COMPLAINT / HPI:   Multiple Concerns  Need for PCS  H/o Falls: -Patient sees palliative care -She reports that she has had multiple falls, 8 falls in the last few weeks -Patient since with a friend who also provides history -She has a good support system at home -Has diffuse anasarca that has not improved with the Lasix -Is NOT able to stand and walk around her house as her right leg is difficult to move secondary to swelling in lower extremities  -Patient lives alone and does not have personal care services currently -Has not hit her head -Patient does report that she has had worsening hard areas in her lower abdomen and left hip nodule that she believes is related to worsening tumors -Her goals are to stay at home and to remain as active as able for the rest of her life -Needs Pleur-X drain system   PERTINENT  PMH / PSH:  -Former smoker with 22 pack year history -Stage IV non-small cell lung cancer, adenocarcinoma diagnosed in August 2023. Completed palliative radiotherapy to the metastatic bone lesions at L4 and the sacrum 10/22/21. Underwent palliative systemic chemotherapy. After 19 cycles, self discontinued treatment in September 2024 due to side effects and quality of life.  OBJECTIVE:   BP 98/62   Pulse 87   Ht 5\' 7"  (1.702 m)   Wt 190 lb 6.4 oz (86.4 kg)   LMP 06/12/2016 (Approximate)   SpO2 99%   BMI 29.82 kg/m   General: Alert and oriented in no apparent distress, cachetic but nontoxic  Heart: Regular rate and rhythm with no murmurs appreciated Lungs: CTA bilaterally, no wheezing Abdomen: Bowel sounds present, no abdominal pain, central lower abdominal nodule  Skin: Warm and dry Extremities: Diffuse anasarca to the abdomen, DP pulses palpable, weeping in extremities, nodule in left hip with indentation from fluid accumulation    ASSESSMENT/PLAN:   Assessment & Plan Adenocarcinoma of right lung, stage 4 (HCC) Patient likely with growing  tumors noted on physical examination.  Will update oncology about this, see medical history for current treatment of adenocarcinoma.  We are working on keeping the patient as active as possible and trying to keep pain at a minimum.  She does have a walker that she utilizes but likely needs a bedside commode.  Additionally, filled out personal care services and provided to RN team.  I feel the patient needs personal care services frequently as she is high risk of falls and admission to the hospital.  She is unable to perform her ADLs currently and lives home alone.  Patient also needs home health physical therapy that was ordered for her. Anasarca Patient was offered hospital admission for IV diuresis and assistance.  However, she politely declined.  She is stable for outpatient management.  Will continue with torsemide 20 mg in addition to obtaining up-to-date CMP.  Seems that hypoalbuminemia is likely a contributor.  Discontinue Lasix at as it is not helpful for her currently. HTN (hypertension), benign DECREASE Coreg given softer pressures in office.  She was started on torsemide and instructed to check her pressures in office Coordination of complex care VBCI order placed for the patient as she has complex care needs.  Additionally, changed PCP to me for continuity.  Strict return precautions.  Patient is aware that she has high risk of admission and high risk of concerning sequela from falls.    Had preceptor see in office and discussed in depth.   Deane Wattenbarger  Jena Gauss, MD [facility]

## 2023-03-31 ENCOUNTER — Other Ambulatory Visit: Payer: Self-pay

## 2023-03-31 ENCOUNTER — Other Ambulatory Visit: Payer: Self-pay | Admitting: Physician Assistant

## 2023-03-31 DIAGNOSIS — R601 Generalized edema: Secondary | ICD-10-CM | POA: Diagnosis not present

## 2023-03-31 DIAGNOSIS — C3491 Malignant neoplasm of unspecified part of right bronchus or lung: Secondary | ICD-10-CM | POA: Diagnosis not present

## 2023-04-01 ENCOUNTER — Telehealth: Payer: Self-pay | Admitting: *Deleted

## 2023-04-01 ENCOUNTER — Encounter: Payer: Self-pay | Admitting: Student

## 2023-04-01 ENCOUNTER — Encounter: Payer: Self-pay | Admitting: Internal Medicine

## 2023-04-01 LAB — COMPREHENSIVE METABOLIC PANEL
ALT: 10 IU/L (ref 0–32)
AST: 18 IU/L (ref 0–40)
Albumin: 2.7 g/dL — ABNORMAL LOW (ref 3.8–4.9)
Alkaline Phosphatase: 101 IU/L (ref 44–121)
BUN/Creatinine Ratio: 17 (ref 9–23)
BUN: 16 mg/dL (ref 6–24)
Bilirubin Total: 0.3 mg/dL (ref 0.0–1.2)
CO2: 21 mmol/L (ref 20–29)
Calcium: 8 mg/dL — ABNORMAL LOW (ref 8.7–10.2)
Chloride: 102 mmol/L (ref 96–106)
Creatinine, Ser: 0.93 mg/dL (ref 0.57–1.00)
Globulin, Total: 2.4 g/dL (ref 1.5–4.5)
Glucose: 129 mg/dL — ABNORMAL HIGH (ref 70–99)
Potassium: 4.2 mmol/L (ref 3.5–5.2)
Sodium: 136 mmol/L (ref 134–144)
Total Protein: 5.1 g/dL — ABNORMAL LOW (ref 6.0–8.5)
eGFR: 71 mL/min/{1.73_m2} (ref >59)

## 2023-04-01 NOTE — Progress Notes (Signed)
 Complex Care Management Note  Care Guide Note 04/01/2023 Name: Cassidy [name] MRN: [id number] DOB: 1965-04-11  Cassidy [name] is a 58 y.o. year old female who sees Alfredo Martinez, MD for primary care. I reached out to [name] by phone today to offer complex care management services.  [name] was given information about Complex Care Management services today including:   The Complex Care Management services include support from the care team which includes your Nurse Care Manager, Clinical Social Worker, or Pharmacist.  The Complex Care Management team is here to help remove barriers to the health concerns and goals most important to you. Complex Care Management services are voluntary, and the patient may decline or stop services at any time by request to their care team member.   Complex Care Management Consent Status: Patient agreed to services and verbal consent obtained.   Follow up plan:  Telephone appointment with complex care management team member scheduled for:  3/25 with LCSW and 3/26 with RNCM   Encounter Outcome:  Patient Scheduled Gwenevere Ghazi  [facility]  [facility] Guide  Direct Dial: [phone]  Fax [phone]

## 2023-04-04 ENCOUNTER — Telehealth: Payer: Self-pay

## 2023-04-04 NOTE — Telephone Encounter (Signed)
 Received a message from patients PCP, Dr Jena Gauss stating that patient was complaining of 'enlarging nodules' over the hip and lower abdomen for which she was concerned these were secondary to her malignancy.  Per Cassie, PA- patient is scheduled for restaging CT scan on 04/19/2023 and scan can be moved up to the patients earliest convenience. Patient stated that she is aware of radiology scheduling's number and will call and reschedule.  Informed patient to give our office a call once scheduled and Port flush with lab appt will be rescheduled to the same day and follow up with provider in a week. Patient verbalized understanding.

## 2023-04-04 NOTE — Telephone Encounter (Signed)
 Patient returns call to nurse line. She states that Dr. Jena Gauss had provided the hospital with the okay to dispense her with drainage bottles, however, they were not correct. She said that she has not been able to drain in 4 weeks. She needs Purplex drainage bottles. She reports having increased swelling in her legs, abdominal bloating and abdominal pain. She also feels that legs are warm to touch and are draining. I advised her to go to the ED regarding her current symptoms. She denies current shortness of breath and is speaking in complete sentences.   She also wanted me to route this message to Dr. Jena Gauss so that she would be aware.   Veronda Prude, RN

## 2023-04-04 NOTE — Progress Notes (Addendum)
 ADDENDUM to 03/29/23 note:    Patient, new to me as PCP, had pleurx catheter placed with IR on 02/16/23 per Dr. Sharion Dove orders from our office. Requires single drain for a frequency of twice weekly for medical need of refractory ascites.    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

## 2023-04-04 NOTE — Telephone Encounter (Signed)
 ADDENDUM to 03/29/23 note:    Patient, new to me as PCP, had pleurx catheter placed with IR on 02/16/23 per Dr. Sharion Dove orders from our office. Requires single drain for a frequency of twice weekly for medical need of refractory ascites.    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   Alfredo Martinez MD

## 2023-04-04 NOTE — Telephone Encounter (Signed)
 Received call from [facility] regarding patient's Pleurx supplies.   She reports that form itself needs placement date. This can be included, initialed and dated on original form and then faxed back to [employer]. This will need to be done by Dr. Jennette Kettle due to insurance requirements. Form in Dr. Donnetta Hail box.   She also reports that chart notes are missing all billable items that insurance requires. This needs to include ICD codes used on rx request, placement date, frequency, single or bilateral drains and medical need for Pleurx supplies.   Chart notes from 03/29/23 will need to be addended and then faxed back to [employer].   Veronda Prude, RN

## 2023-04-05 ENCOUNTER — Telehealth: Payer: Self-pay | Admitting: Student

## 2023-04-05 ENCOUNTER — Ambulatory Visit: Payer: Self-pay | Admitting: Licensed Clinical Social Worker

## 2023-04-05 DIAGNOSIS — R601 Generalized edema: Secondary | ICD-10-CM

## 2023-04-05 NOTE — Progress Notes (Signed)
    SUBJECTIVE:   CHIEF COMPLAINT / HPI:   [facility] Telemedicine Visit  Patient consented to have virtual visit and was identified by name and date of birth. Method of visit: Video  Encounter participants: Patient: Cassidy [name] - located at home Provider: Levin Erp - located at [facility]   Chief Complaint: Follow up fluid  HPI:  58 year old with a history of stage four lung cancer, presents with significant leg swelling and difficulty in mobility. Seen last week in clinic and switched from lasix to torsemide. She reports that the swelling has progressed to the chest/breast area, causing discomfort and difficulty in moving around.  She uses a walker for mobility but reports difficulty in climbing stairs due to the inability to lift the right leg. [place] also [place]  Also has a pleurx drain for recurrent ascites, but it has not been draining per patient for the past four weeks due to a lack of the correct bottles for drainage. Having abdominal distention and discomfort but no fevers. Reports constipation, which has improved recently with the use of dragon fruit.  She lives alone and has expressed concerns about the living conditions in the current apartment, describing the landlord as a 'slumlord.' She has expressed a desire to move to a handicap-accessible apartment due to the worsening health conditions. She is plugged in with social work per chart review. She also reports a recent death in the family, adding to the emotional stress.  She does have some concern for blood clot in leg  She states it is within her goals of care for hospitalization to relieve fluid  States she does not have any shortness of breath, chest pain, fevers, vomiting  ROS: per HPI  Pertinent PMHx: HTN, CAD s/p CABG, Stage 4 Lung Cancer-dx 2023 completed palliative radiotherapy to mets on L4 and sacrum 10/22/21 + after 19 cycles self discontinued in  Sept 2024 d/t side effects and quality of life, pleural effusion, GERD, T2DM, BPD, recurrent ascites  Exam:  LMP 06/12/2016 (Approximate)   Respiratory: speaking full sentences, no iWOB on RA Abdomen: distended, catheter site with bandage CDI BLE: weeping extremities, anasarca  Assessment/Plan: Assessment & Plan Anasarca Severe generalized edema likely secondary to hypoalbuminemia associated with advanced cancer. Torsemide causing some fluid release, but significant swelling persists. Worsened d/t catheter not draining. Concern for potential complications such as skin breakdown, infection, VTE. Patient amenable to ED evaluation/admission for IV diuresis but defers this until Friday due to scheduling (although strongly advised EMS take her today) - Continue torsemide as prescribed - Pt aware of severe complications and worsening including death if not going to ED for evaluation - RN team working on Purplex drain bottles - FMTS made aware of patient in case of possible admission for IV diuresis - Strict ED precautions discussed   Levin Erp, MD [facility]

## 2023-04-05 NOTE — Assessment & Plan Note (Signed)
 Severe generalized edema likely secondary to hypoalbuminemia associated with advanced cancer. Torsemide causing some fluid release, but significant swelling persists. Worsened d/t catheter not draining. Concern for potential complications such as skin breakdown, infection, VTE. Patient amenable to ED evaluation/admission for IV diuresis but defers this until Friday due to scheduling (although strongly advised EMS take her today) - Continue torsemide as prescribed - Pt aware of severe complications and worsening including death if not going to ED for evaluation - RN team working on Purplex drain bottles - FMTS made aware of patient in case of possible admission for IV diuresis - Strict ED precautions discussed

## 2023-04-06 ENCOUNTER — Ambulatory Visit: Payer: Self-pay

## 2023-04-06 ENCOUNTER — Inpatient Hospital Stay: Admitting: Nurse Practitioner

## 2023-04-06 DIAGNOSIS — R918 Other nonspecific abnormal finding of lung field: Secondary | ICD-10-CM

## 2023-04-06 DIAGNOSIS — C3491 Malignant neoplasm of unspecified part of right bronchus or lung: Secondary | ICD-10-CM

## 2023-04-06 DIAGNOSIS — Z515 Encounter for palliative care: Secondary | ICD-10-CM

## 2023-04-06 DIAGNOSIS — Z789 Other specified health status: Secondary | ICD-10-CM

## 2023-04-06 DIAGNOSIS — G893 Neoplasm related pain (acute) (chronic): Secondary | ICD-10-CM | POA: Diagnosis not present

## 2023-04-06 DIAGNOSIS — R630 Anorexia: Secondary | ICD-10-CM | POA: Diagnosis not present

## 2023-04-06 DIAGNOSIS — Z95828 Presence of other vascular implants and grafts: Secondary | ICD-10-CM

## 2023-04-06 DIAGNOSIS — R350 Frequency of micturition: Secondary | ICD-10-CM

## 2023-04-06 DIAGNOSIS — Z7189 Other specified counseling: Secondary | ICD-10-CM | POA: Diagnosis not present

## 2023-04-06 DIAGNOSIS — R530 Neoplastic (malignant) related fatigue: Secondary | ICD-10-CM

## 2023-04-06 DIAGNOSIS — R634 Abnormal weight loss: Secondary | ICD-10-CM | POA: Diagnosis not present

## 2023-04-06 DIAGNOSIS — Z5111 Encounter for antineoplastic chemotherapy: Secondary | ICD-10-CM

## 2023-04-06 MED ORDER — FENTANYL 25 MCG/HR TD PT72
1.0000 | MEDICATED_PATCH | TRANSDERMAL | 0 refills | Status: DC
Start: 2023-04-06 — End: 2023-05-04

## 2023-04-06 NOTE — Patient Instructions (Signed)
 Cassidy [name]

## 2023-04-06 NOTE — Patient Outreach (Signed)
 Care Coordination   Initial Visit Note   04/06/2023 Name: Cassidy [name] MRN: [id number] DOB: 1965/10/21  Cassidy [name] is a 58 y.o. year old female who sees Alfredo Martinez, MD for primary care. I spoke with  [name] by phone today.  What matters to the patients health and wellness today?  Increasing access to resources to better manage care needs.    Goals Addressed   None     SDOH assessments and interventions completed:  Yes  SDOH Interventions Today    Flowsheet Row Most Recent Value  SDOH Interventions   Food Insecurity Interventions Intervention Not Indicated  Housing Interventions Intervention Not Indicated  [Pt is interested in moving to new housing - will send housing resources.]  Transportation Interventions Intervention Not Indicated  Utilities Interventions Intervention Not Indicated        Care Coordination Interventions:  Yes, provided   Follow up plan: Follow up call scheduled for 04/26/2023 with CSW Lewis.    Encounter Outcome:  Patient Visit Completed   Kenton Kingfisher, LCSW [facility], [facility] Licensed Clinical Social Worker Care Coordinator [phone]

## 2023-04-06 NOTE — Telephone Encounter (Signed)
 Will forward to Admin team to assist with printing updated medical records from 03/29/23.  Dr. Jennette Kettle- once form has been updated, you can return to RN team and I can attach the med records and fax back to [employer].   Thanks.   Veronda Prude, RN

## 2023-04-07 ENCOUNTER — Encounter: Payer: Self-pay | Admitting: Internal Medicine

## 2023-04-07 ENCOUNTER — Telehealth: Payer: Self-pay | Admitting: *Deleted

## 2023-04-07 NOTE — Progress Notes (Signed)
 Complex Care Management Care Guide Note  04/07/2023 Name: [name] MRN: [id number] DOB: Oct 16, 1965  Cassidy [name] is a 58 y.o. year old female who is a primary care patient of Alfredo Martinez, MD and is actively engaged with the care management team. I reached out to [name] by phone today to assist with scheduling  with the BSW.  Follow up plan: Telephone appointment with complex care management team member scheduled for:  4/1  Gwenevere Ghazi  [facility]  [facility], [facility] Guide  Direct Dial: [phone]  Fax [phone]

## 2023-04-07 NOTE — Patient Instructions (Signed)
 Visit Information  Thank you for taking time to visit with me today. Please don't hesitate to contact me if I can be of assistance to you.   Following are the goals we discussed today:   Goals Addressed             This Visit's Progress    Patient Stated-She needs incotinence  supplies , medical alert bracelet housing and PCS worker       Care Coordination Interventions:  Active listening / Reflection utilized  Emotional Support Provided Problem Solving /Task Center strategies reviewed Collaborated with [facility] Medicaid Harle Battiest PCP a message          Our next appointment is by telephone on 05/11/23 at 1130 am  Please call the care guide team at [phone] if you need to cancel or reschedule your appointment.   If you are experiencing a Mental Health or Behavioral Health Crisis or need someone to talk to, please call 1-800-273-TALK (toll free, 24 hour hotline)  Patient verbalizes understanding of instructions and care plan provided today and agrees to view in MyChart. Active MyChart status and patient understanding of how to access instructions and care plan via MyChart confirmed with patient.     Juanell Fairly RN, BSN, [facility]  [facility], [facility]  Care Coordinator Phone: [phone]

## 2023-04-07 NOTE — Patient Outreach (Signed)
 Care Coordination   Follow Up Visit Note   04/07/2023 Name: Cassidy [name] MRN: [id number] DOB: 05/17/1965  Cassidy [name] is a 58 y.o. year old female who sees Alfredo Martinez, MD for primary care. I spoke with  Cassidy [name] by phone today.  What matters to the patients health and wellness today?  I communicated with Cassidy [name] today to review her medical chart, including her current medications and allergies. Cassidy [name] is presently undergoing chemotherapy, which has resulted in edema in her legs. Consequently, she resides on the second floor of her apartment and requires assistance from family members to navigate the stairs.  Cassidy [name] is actively seeking accessible housing options, as she requires a one-level apartment. Additionally, she is interested in obtaining a medical alert bracelet due to her living situation. She reported difficulty bathing, given the design of her step-over bathtub, and has experienced several falls in the past. Moreover, she is managing incontinence and has been personally financing her incontinence supplies.  During our conversation, we contacted her insurance provider, which confirmed coverage for her incontinence supplies through the company [employer]. The insurance also provides coverage for personal care services and a bedside commode. I have reached out to her primary care physician to request a prescription for her incontinence supplies and the bedside commode to be sent to [employer], as well as to initiate paperwork for her personal care services.  Furthermore, I submitted a referral to assist her in exploring housing options for individuals with disabilities and to acquire a medical alert bracelet. I will follow up with Ms. Spizzirri on April 30th to ascertain the status of these matters. She indicated that while she does not require additional health assistance at this time, she has my contact information should she need further support.    Goals Addressed              This Visit's Progress    Patient Stated-She needs incotinence  supplies , medical alert bracelet housing and PCS worker       Care Coordination Interventions:  Active listening / Reflection utilized  Emotional Support Provided Problem Solving /Task Center strategies reviewed Collaborated with [facility] Medicaid Harle Battiest PCP a message          SDOH assessments and interventions completed:  Yes  SDOH Interventions Today    Flowsheet Row Most Recent Value  SDOH Interventions   Food Insecurity Interventions Intervention Not Indicated  Housing Interventions Intervention Not Indicated  Transportation Interventions Intervention Not Indicated  Utilities Interventions Intervention Not Indicated        Care Coordination Interventions:  Yes, provided   Juanell Fairly RN, BSN, [facility]  [facility], [facility]  Care Coordinator Phone: [phone]      Follow up plan: Follow up call scheduled for 05/11/23  1130 am    Encounter Outcome:  Patient Visit Completed   Juanell Fairly RN, BSN, [facility]  [facility], [facility]  Care Coordinator Phone: [phone]

## 2023-04-07 NOTE — Progress Notes (Addendum)
 Palliative Medicine [facility]  [phone] [phone]   Name: Cassidy [name] Date: 04/07/2023 MRN: [id number]  DOB: Nov 18, 1965  Patient Care Team: Ernestina Headland, MD as PCP - General (Family Medicine) O'Neal, Cathay Clonts, MD as PCP - Cardiology (Cardiology) Enid Harry, MD as Consulting Physician (General Surgery) Pickenpack-Cousar, Giles Labrum, NP as Nurse Practitioner (Hospice and Palliative Medicine) Randye Buttner, RN as Registered Nurse [facility]    INTERVAL HISTORY: Cassidy [name] is a 58 y.o. female with   SOCIAL HISTORY:     reports that she has quit smoking. Her smoking use included cigarettes. She started smoking about 44 years ago. She has a 22.1 pack-year smoking history. She has never used smokeless tobacco. She reports that she does not currently use drugs. She reports that she does not drink alcohol .  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  s/p PCTA to dRCA and ostial RPAV-PLA vessel + PDA branch (05/2012)  b. USA  s/p DES to mLAD - Resolute DES 3.0 x 22 (3.80mm -->3.3 mm)   Bipolar depression (HCC)    Breast abscess    a. right side.    COPD (chronic obstructive pulmonary disease) (HCC)    Depression    Diabetic peripheral neuropathy (HCC)    Fibromyalgia    Genital warts    GERD (gastroesophageal reflux disease)    History of hiatal hernia    HLD (hyperlipidemia)    Hypertension    Lung cancer (HCC)    Pain in limb    a. LE VENOUS DUPLEX, 02/05/2009 - no evidence of deep vein thrombosis, Baker's cyst   Pneumonia    PTSD (post-traumatic stress disorder)    Sickle cell trait (HCC)    Tobacco abuse    Tooth  caries    Type II diabetes mellitus (HCC)     ALLERGIES:  is allergic to amoxil  [amoxicillin ], chantix [varenicline], suboxone [buprenorphine hcl-naloxone hcl], and emend [fosaprepitant  dimeglumine].  MEDICATIONS:  Current Outpatient Medications  Medication Sig Dispense Refill   fentaNYL  (DURAGESIC ) 25 MCG/HR Place 1 patch onto the skin every 3 (three) days. 10 patch 0   aspirin  EC 81 MG tablet Take 81 mg by mouth daily.     atorvastatin  (LIPITOR) 40 MG tablet TAKE 1 TABLET (40 MG TOTAL) BY MOUTH DAILY. 90 tablet 3   busPIRone  (BUSPAR ) 15 MG tablet Take 0.5 tablets (7.5 mg total) by mouth 2 (two) times daily. 60 tablet 3   Carboxymethylcellul-Glycerin (LUBRICATING EYE DROPS OP) Place 1 drop into both eyes daily as needed (dry eyes).     carvedilol  (COREG ) 6.25 MG tablet Take 2 tablets (12.5 mg total) by mouth 2 (two) times daily with a meal. 60 tablet 0   diclofenac  Sodium (VOLTAREN ) 1 % GEL Apply 1 application topically 3 times daily as needed for pain 200 g 1   docusate sodium  (COLACE) 100 MG capsule Take 1 capsule (100 mg total) by mouth daily. 10 capsule 0   DULoxetine  (CYMBALTA ) 30 MG capsule Take 1 capsule (30 mg total) by mouth daily. 30 capsule 5   ferrous sulfate  325 (65 FE) MG tablet Take 1 tablet (325 mg total) by  mouth daily with breakfast. 30 tablet 2   fluticasone  (FLONASE ) 50 MCG/ACT nasal spray PLACE 1 SPRAY INTO BOTH NOSTRILS DAILY AS NEEDED (CONGESTION). 16 g 1   folic acid  (FOLVITE ) 1 MG tablet TAKE 1 TABLET (1 MG TOTAL) BY MOUTH DAILY. 30 tablet 3   glucose blood (ACCU-CHEK AVIVA PLUS) test strip USE AS INSTRUCTED EVERY DAY 50 each 1   hyoscyamine  (LEVSIN SL) 0.125 MG SL tablet Place 1 tablet (0.125 mg total) under the tongue every 6 (six) hours as needed. For abdominal cramping 30 tablet 0   insulin  glargine (LANTUS ) 100 UNIT/ML Solostar Pen Inject 24 Units into the skin every morning. 3 mL 11   Insulin  Pen Needle 32G X 4 MM MISC 1 Needle by Does not apply route in the  morning and at bedtime. 120 each 2   ipratropium (ATROVENT  HFA) 17 MCG/ACT inhaler Inhale 2 puffs into the lungs every 4 (four) hours as needed for wheezing (COPD).     lactulose  (CHRONULAC ) 10 GM/15ML solution Take 15 mLs (10 g total) by mouth daily as needed for moderate constipation or severe constipation. 236 mL 0   lidocaine -prilocaine  (EMLA ) cream APPLY 1 APPLICATION TOPICALLY AS NEEDED. (Patient not taking: Reported on 04/06/2023) 30 g 2   LORazepam  (ATIVAN ) 0.5 MG tablet Take 1 tablet (0.5 mg total) by mouth every 8 (eight) hours as needed for anxiety (nausea). 30 tablet 0   metoCLOPramide  (REGLAN ) 10 MG tablet Take 1 tablet (10 mg total) by mouth every 8 (eight) hours as needed for nausea. 60 tablet 2   mometasone -formoterol  (DULERA ) 100-5 MCG/ACT AERO Inhale 2 puffs into the lungs in the morning and at bedtime. 13 g 3   nicotine  polacrilex (NICORETTE ) 2 MG gum Take 1 each (2 mg total) by mouth as needed for smoking cessation. (Patient not taking: Reported on 04/06/2023) 100 tablet 0   nystatin  (MYCOSTATIN ) 100000 UNIT/ML suspension Take 5 mLs (500,000 Units total) by mouth 4 (four) times daily. 473 mL 2   ondansetron  (ZOFRAN -ODT) 8 MG disintegrating tablet Take 1 tablet (8 mg total) by mouth every 8 (eight) hours as needed for nausea or vomiting. 30 tablet 2   oxyCODONE  (ROXICODONE ) 15 MG immediate release tablet Take 1-2 tablets (15-30 mg total) by mouth every 4 (four) hours as needed for pain. 120 tablet 0   pantoprazole  (PROTONIX ) 40 MG tablet Take 1 tablet (40 mg total) by mouth 2 (two) times daily. 60 tablet 5   polyethylene glycol powder (GAVILAX) 17 GM/SCOOP powder Take 17 g by mouth daily. 850 g 1   prochlorperazine  (COMPAZINE ) 10 MG tablet TAKE 1 TABLET (10 MG TOTAL) BY MOUTH EVERY 6 (SIX) HOURS AS NEEDED FOR NAUSEA OR VOMITING. (Patient not taking: Reported on 04/06/2023) 30 tablet 0   spironolactone  (ALDACTONE ) 50 MG tablet Take 1 tablet (50 mg total) by mouth at bedtime. 90 tablet 0    torsemide  (DEMADEX ) 20 MG tablet Take 1 tablet (20 mg total) by mouth daily. 30 tablet 0   valACYclovir  (VALTREX ) 1000 MG tablet Take for 3 days prn each outbreak. (Patient taking differently: Take 1,000 mg by mouth daily. Take for 3 days prn each outbreak.) 30 tablet 11   No current facility-administered medications for this visit.    VITAL SIGNS: LMP 06/12/2016 (Approximate)  There were no vitals filed for this visit.  Estimated body mass index is 29.82 kg/m as calculated from the following:   Height as of 03/29/23: 5\' 7"  (1.702 m).   Weight as of 03/29/23:  190 lb 6.4 oz (86.4 kg).   PERFORMANCE STATUS (ECOG) : 3 - Symptomatic, >50% confined to bed  IMPRESSION: Discussed the use of AI scribe software for clinical note transcription with the patient, who gave verbal consent to proceed.  History of Present Illness Cassidy [name] is a 58 year old female metastatic non-small cell lung cancer, not currently undergoing treatment. I connected by phone with patient for follow-up. She expresses concerns of pain management, continued health decline, edema, and mobility concerns. No family present during discussions.   She is dealing with significant emotional distress following the recent death of her aunt, which has exacerbated her anxiety and emotional state. She reports crying and feeling numb, despite taking her anxiety medication. She feels frustrated and fatigued with her current health situation and the need for assistance with daily activities. States she feels as though she is going thru a similar process in her life. Emotional provided.   She has a history of falls and is considered high-risk for further falls. She mentions a previous incident at the beach where she fell and hit her head, leading to further decline of current mobility issues. She is concerned about potential complications from this fall, including the possibility of a blood clot or other serious issues however has not  received medical work-up. She has been engaging in virtual care with her PCP who has suggested hospital evaluation. Patient expresses understanding of the request.   Isidora is also dealing with logistical issues related to her medical care, including problems with receiving the correct medical supplies for drainage and the need to coordinate her care and living situation. She is actively working on getting her affairs in order, including housing arrangements and care for her pet, in preparation for a potential hospital admission and other health needs.   We discussed her pain at length. She is experiencing significant pain and mobility issues, which have worsened over time.  Reports difficulty standing and walking, requiring assistance from 3 family members to navigate stairs.  She is unable to lift her leg independently and requires help to get into her house. She can descend stairs but struggles to ascend them.  Her current regimen consists of fentanyl  patch (12 mcg) and oxycodone  (15 mg) every four hours as needed, which she states is the only way she can manage her pain and remain somewhat functional. Without her medication, she is unable to perform any activities. She also experiences sharp back pain, which she attributes to sciatica, and swelling in various parts of her body, including her eyes, jaw, and breasts.  She refers to her breast being as perky as a 60 year old due to the amount of swelling.  Patient's pain is somewhat controlled at this time.  However she does report several episodes throughout the day of pain that is 10 out of 10.  Education provided to continue with current regimen however with understanding we will increase her fentanyl  patch to 25 mcg.  Patient verbalized understanding and appreciation.  Goals of care We discussed the patient's current illness and what it means in the larger context of their on-going co-morbidities. Natural disease trajectory and expectations were  discussed.  Ms. Hochhalter is realistic in her understanding of her current health state and ongoing decline.  She is emotional expressing her decreased appetite, muscle wasting, weakness, and life in general.  Emotional support provided.  I empathetically approach discussions regarding end-of-life including expectations in the home versus hospital versus inpatient unit.  I had a direct and  honest discussion with Ms. Coch expressing my concerns for her decline in her quality of life and unfortunately it appears that she is approaching end-of-life.  Patient is emotional acknowledging her agreement and observation as well.  I strongly recommended she consider hospice to allow her additional support and comfort for what time she has left.  She expresses her desire not to disconnect however I advised patient myself and team can continue to be involved in her care in addition with hospice and we will gladly serve as her attending.  She verbalized understanding expressing if this was the case she would then be a more agreement also sharing she wanted to think about it a little further and discussed with her son.  Education provided on hospice's goals and philosophy of care in addition to what symptom management would look like for patient.  Discussions regarding healthcare limitations which included DNR/DNI, no artificial feeding tubes, and the desire for natural death.  Patient states her cell phone is not is up-to-date and she is not technology savvy otherwise we will complete electronic MOST form.  I strongly recommended to Ms. Brana to proceed with emergency evaluation with understanding of limitations in her care at the time of workup.  She wishes to wait till Friday before she seeks medical assistance again referring to getting her house in order.  I discussed palliative being involved if she is admitted and also having the hospice liaison engage in discussions with patient and family.  She verbalized  understanding expressing she would be open to this per recommendations.  I discussed the importance of continued conversation with family and their medical providers regarding overall plan of care and treatment options, ensuring decisions are within the context of the patients values and GOCs.  Assessment and Plan Assessment & Plan Pain management Cancer related pain persists despite current regimen. Increasing fentanyl  anticipated to improve control. - Increase fentanyl  patch to 25 mcg. - Continue oxycodone  15 mg every four hours as needed.  Mobility issues Severe mobility issues with right-sided weakness. MRI needed to assess potential brain injury. -Strongly encouraged patient to consider ED evaluation and management.  Emotional distress and anxiety Increased anxiety and emotional episodes post bereavement. Current medication sometimes ineffective. - Continue current anxiety medication.  Goals of Care Discussed hospice care for symptom management and quality of life improvement. Addressed concerns about treatment cessation and port use.  Strongly encouraged patient to consider enrolling in hospice for what time she has left given poor overall prognosis and quality of life.  We had a long extensive discussion regarding her wishes.  I empathetically expressed directly to her my concerns.  Patient expressed she is much more open to consider hospice at this time. - Discuss hospice care options during hospital admission. - Ensure understanding that hospice can manage symptoms and the port can remain for medication administration. -Would encourage palliative to be involved in patient's care if hospitalized without delay to allow for ongoing discussions and complex decision making. -Patient is aware if she enrolls in hospice and would like that we generally allow their team to direct care however if the barrier is fear of loss of relationship I will happily service her attending under hospice if  needed.  Follow-up Hospital admission planned for further evaluation and hospice care discussion. - Admit to hospital on Friday for evaluation and management. - Check in next week to assess progress and decisions regarding hospice care.  Patient expressed understanding and was in agreement with this plan. She also  understands that She can call the clinic at any time with any questions, concerns, or complaints.   Any controlled substances utilized were prescribed in the context of palliative care. PDMP has been reviewed.   I provided 65 minutes of non face-to-face telephone visit time during this encounter, and > 50% was spent counseling as documented under my assessment & plan. Visit consisted of counseling and education dealing with the complex and emotionally intense issues of symptom management and palliative care in the setting of serious and potentially life-threatening illness.  Dellia Ferguson, AGPCNP-BC  Palliative Medicine Team/[facility]

## 2023-04-08 ENCOUNTER — Other Ambulatory Visit: Payer: Self-pay | Admitting: Student

## 2023-04-08 ENCOUNTER — Other Ambulatory Visit: Payer: Self-pay

## 2023-04-08 ENCOUNTER — Encounter (HOSPITAL_COMMUNITY): Payer: Self-pay | Admitting: *Deleted

## 2023-04-08 ENCOUNTER — Inpatient Hospital Stay (HOSPITAL_COMMUNITY)
Admission: EM | Admit: 2023-04-08 | Discharge: 2023-04-10 | DRG: 181 | Disposition: A | Discharge status: Home / Self Care | Attending: Family Medicine | Admitting: Family Medicine

## 2023-04-08 ENCOUNTER — Emergency Department (HOSPITAL_COMMUNITY)

## 2023-04-08 DIAGNOSIS — I2510 Atherosclerotic heart disease of native coronary artery without angina pectoris: Secondary | ICD-10-CM | POA: Diagnosis present

## 2023-04-08 DIAGNOSIS — E785 Hyperlipidemia, unspecified: Secondary | ICD-10-CM | POA: Diagnosis present

## 2023-04-08 DIAGNOSIS — I10 Essential (primary) hypertension: Secondary | ICD-10-CM | POA: Diagnosis present

## 2023-04-08 DIAGNOSIS — Z800 Family history of malignant neoplasm of digestive organs: Secondary | ICD-10-CM

## 2023-04-08 DIAGNOSIS — Z7982 Long term (current) use of aspirin: Secondary | ICD-10-CM

## 2023-04-08 DIAGNOSIS — Z515 Encounter for palliative care: Secondary | ICD-10-CM

## 2023-04-08 DIAGNOSIS — K219 Gastro-esophageal reflux disease without esophagitis: Secondary | ICD-10-CM | POA: Diagnosis present

## 2023-04-08 DIAGNOSIS — C7951 Secondary malignant neoplasm of bone: Secondary | ICD-10-CM | POA: Diagnosis present

## 2023-04-08 DIAGNOSIS — Z79899 Other long term (current) drug therapy: Secondary | ICD-10-CM

## 2023-04-08 DIAGNOSIS — E1142 Type 2 diabetes mellitus with diabetic polyneuropathy: Secondary | ICD-10-CM | POA: Diagnosis present

## 2023-04-08 DIAGNOSIS — J449 Chronic obstructive pulmonary disease, unspecified: Secondary | ICD-10-CM | POA: Diagnosis present

## 2023-04-08 DIAGNOSIS — Z951 Presence of aortocoronary bypass graft: Secondary | ICD-10-CM

## 2023-04-08 DIAGNOSIS — E876 Hypokalemia: Secondary | ICD-10-CM | POA: Diagnosis present

## 2023-04-08 DIAGNOSIS — C3491 Malignant neoplasm of unspecified part of right bronchus or lung: Principal | ICD-10-CM | POA: Diagnosis present

## 2023-04-08 DIAGNOSIS — Z807 Family history of other malignant neoplasms of lymphoid, hematopoietic and related tissues: Secondary | ICD-10-CM

## 2023-04-08 DIAGNOSIS — R601 Generalized edema: Principal | ICD-10-CM | POA: Diagnosis present

## 2023-04-08 DIAGNOSIS — Z5986 Financial insecurity: Secondary | ICD-10-CM

## 2023-04-08 DIAGNOSIS — G893 Neoplasm related pain (acute) (chronic): Secondary | ICD-10-CM | POA: Diagnosis not present

## 2023-04-08 DIAGNOSIS — R188 Other ascites: Secondary | ICD-10-CM | POA: Diagnosis not present

## 2023-04-08 DIAGNOSIS — F17210 Nicotine dependence, cigarettes, uncomplicated: Secondary | ICD-10-CM | POA: Diagnosis present

## 2023-04-08 DIAGNOSIS — I5031 Acute diastolic (congestive) heart failure: Secondary | ICD-10-CM | POA: Diagnosis not present

## 2023-04-08 DIAGNOSIS — Z923 Personal history of irradiation: Secondary | ICD-10-CM

## 2023-04-08 DIAGNOSIS — Z833 Family history of diabetes mellitus: Secondary | ICD-10-CM

## 2023-04-08 DIAGNOSIS — Z83438 Family history of other disorder of lipoprotein metabolism and other lipidemia: Secondary | ICD-10-CM

## 2023-04-08 DIAGNOSIS — F319 Bipolar disorder, unspecified: Secondary | ICD-10-CM | POA: Diagnosis present

## 2023-04-08 DIAGNOSIS — M797 Fibromyalgia: Secondary | ICD-10-CM | POA: Diagnosis present

## 2023-04-08 DIAGNOSIS — F4310 Post-traumatic stress disorder, unspecified: Secondary | ICD-10-CM | POA: Diagnosis present

## 2023-04-08 DIAGNOSIS — K5900 Constipation, unspecified: Secondary | ICD-10-CM | POA: Diagnosis present

## 2023-04-08 DIAGNOSIS — R180 Malignant ascites: Secondary | ICD-10-CM | POA: Diagnosis present

## 2023-04-08 DIAGNOSIS — D573 Sickle-cell trait: Secondary | ICD-10-CM | POA: Diagnosis present

## 2023-04-08 DIAGNOSIS — Z7951 Long term (current) use of inhaled steroids: Secondary | ICD-10-CM

## 2023-04-08 DIAGNOSIS — D630 Anemia in neoplastic disease: Secondary | ICD-10-CM | POA: Diagnosis present

## 2023-04-08 DIAGNOSIS — M549 Dorsalgia, unspecified: Secondary | ICD-10-CM | POA: Diagnosis present

## 2023-04-08 DIAGNOSIS — Z720 Tobacco use: Secondary | ICD-10-CM | POA: Diagnosis present

## 2023-04-08 DIAGNOSIS — Z8249 Family history of ischemic heart disease and other diseases of the circulatory system: Secondary | ICD-10-CM | POA: Diagnosis not present

## 2023-04-08 DIAGNOSIS — Z66 Do not resuscitate: Secondary | ICD-10-CM | POA: Diagnosis present

## 2023-04-08 DIAGNOSIS — Z888 Allergy status to other drugs, medicaments and biological substances status: Secondary | ICD-10-CM

## 2023-04-08 DIAGNOSIS — E8770 Fluid overload, unspecified: Secondary | ICD-10-CM | POA: Diagnosis not present

## 2023-04-08 DIAGNOSIS — Z789 Other specified health status: Secondary | ICD-10-CM

## 2023-04-08 DIAGNOSIS — Z825 Family history of asthma and other chronic lower respiratory diseases: Secondary | ICD-10-CM

## 2023-04-08 DIAGNOSIS — Z9221 Personal history of antineoplastic chemotherapy: Secondary | ICD-10-CM

## 2023-04-08 DIAGNOSIS — Z955 Presence of coronary angioplasty implant and graft: Secondary | ICD-10-CM

## 2023-04-08 DIAGNOSIS — Z794 Long term (current) use of insulin: Secondary | ICD-10-CM | POA: Diagnosis not present

## 2023-04-08 DIAGNOSIS — G8929 Other chronic pain: Secondary | ICD-10-CM | POA: Insufficient documentation

## 2023-04-08 DIAGNOSIS — F3132 Bipolar disorder, current episode depressed, moderate: Secondary | ICD-10-CM | POA: Diagnosis present

## 2023-04-08 DIAGNOSIS — Z880 Allergy status to penicillin: Secondary | ICD-10-CM

## 2023-04-08 LAB — I-STAT CHEM 8, ED
BUN: 18 mg/dL (ref 6–20)
Calcium, Ion: 1.04 mmol/L — ABNORMAL LOW (ref 1.15–1.40)
Chloride: 104 mmol/L (ref 98–111)
Creatinine, Ser: 1.2 mg/dL — ABNORMAL HIGH (ref 0.44–1.00)
Glucose, Bld: 125 mg/dL — ABNORMAL HIGH (ref 70–99)
HCT: 25 % — ABNORMAL LOW (ref 36.0–46.0)
Hemoglobin: 8.5 g/dL — ABNORMAL LOW (ref 12.0–15.0)
Potassium: 3.4 mmol/L — ABNORMAL LOW (ref 3.5–5.1)
Sodium: 137 mmol/L (ref 135–145)
TCO2: 23 mmol/L (ref 22–32)

## 2023-04-08 LAB — CBC WITH DIFFERENTIAL/PLATELET
Abs Immature Granulocytes: 0.04 10*3/uL (ref 0.00–0.07)
Basophils Absolute: 0 10*3/uL (ref 0.0–0.1)
Basophils Relative: 1 %
Eosinophils Absolute: 0 10*3/uL (ref 0.0–0.5)
Eosinophils Relative: 1 %
HCT: 26.4 % — ABNORMAL LOW (ref 36.0–46.0)
Hemoglobin: 8.5 g/dL — ABNORMAL LOW (ref 12.0–15.0)
Immature Granulocytes: 1 %
Lymphocytes Relative: 22 %
Lymphs Abs: 1 10*3/uL (ref 0.7–4.0)
MCH: 31 pg (ref 26.0–34.0)
MCHC: 32.2 g/dL (ref 30.0–36.0)
MCV: 96.4 fL (ref 80.0–100.0)
Monocytes Absolute: 0.6 10*3/uL (ref 0.1–1.0)
Monocytes Relative: 13 %
Neutro Abs: 2.8 10*3/uL (ref 1.7–7.7)
Neutrophils Relative %: 62 %
Platelets: 284 10*3/uL (ref 150–400)
RBC: 2.74 MIL/uL — ABNORMAL LOW (ref 3.87–5.11)
RDW: 17.6 % — ABNORMAL HIGH (ref 11.5–15.5)
WBC: 4.5 10*3/uL (ref 4.0–10.5)
nRBC: 0 % (ref 0.0–0.2)

## 2023-04-08 LAB — COMPREHENSIVE METABOLIC PANEL WITH GFR
ALT: 10 U/L (ref 0–44)
AST: 19 U/L (ref 15–41)
Albumin: 1.9 g/dL — ABNORMAL LOW (ref 3.5–5.0)
Alkaline Phosphatase: 64 U/L (ref 38–126)
Anion gap: 9 (ref 5–15)
BUN: 17 mg/dL (ref 6–20)
CO2: 21 mmol/L — ABNORMAL LOW (ref 22–32)
Calcium: 7.8 mg/dL — ABNORMAL LOW (ref 8.9–10.3)
Chloride: 106 mmol/L (ref 98–111)
Creatinine, Ser: 1.21 mg/dL — ABNORMAL HIGH (ref 0.44–1.00)
GFR, Estimated: 52 mL/min — ABNORMAL LOW (ref >60)
Glucose, Bld: 129 mg/dL — ABNORMAL HIGH (ref 70–99)
Potassium: 3.4 mmol/L — ABNORMAL LOW (ref 3.5–5.1)
Sodium: 136 mmol/L (ref 135–145)
Total Bilirubin: 0.4 mg/dL (ref 0.0–1.2)
Total Protein: 4.7 g/dL — ABNORMAL LOW (ref 6.5–8.1)

## 2023-04-08 LAB — I-STAT CG4 LACTIC ACID, ED
Lactic Acid, Venous: 1.7 mmol/L (ref 0.5–1.9)
Lactic Acid, Venous: 1.2 mmol/L (ref 0.5–1.9)

## 2023-04-08 LAB — GLUCOSE, CAPILLARY: Glucose-Capillary: 121 mg/dL — ABNORMAL HIGH (ref 70–99)

## 2023-04-08 LAB — LIPASE, BLOOD: Lipase: 29 U/L (ref 11–51)

## 2023-04-08 LAB — BRAIN NATRIURETIC PEPTIDE: B Natriuretic Peptide: 78.7 pg/mL (ref 0.0–100.0)

## 2023-04-08 MED ORDER — FENTANYL 25 MCG/HR TD PT72
1.0000 | MEDICATED_PATCH | TRANSDERMAL | Status: DC
  Administered 2023-04-09: 1 via TRANSDERMAL
  Filled 2023-04-08: qty 1

## 2023-04-08 MED ORDER — FUROSEMIDE 10 MG/ML IJ SOLN
80.0000 mg | Freq: Once | INTRAMUSCULAR | Status: AC
  Administered 2023-04-08: 80 mg via INTRAVENOUS
  Filled 2023-04-08: qty 8

## 2023-04-08 MED ORDER — FUROSEMIDE 10 MG/ML IJ SOLN
40.0000 mg | Freq: Two times a day (BID) | INTRAMUSCULAR | Status: DC
  Administered 2023-04-09 – 2023-04-10 (×3): 40 mg via INTRAVENOUS
  Filled 2023-04-08 (×3): qty 4

## 2023-04-08 MED ORDER — SPIRONOLACTONE 25 MG PO TABS
50.0000 mg | ORAL_TABLET | Freq: Every day | ORAL | Status: DC
  Administered 2023-04-09: 50 mg via ORAL
  Filled 2023-04-08: qty 2

## 2023-04-08 MED ORDER — INSULIN ASPART 100 UNIT/ML IJ SOLN
0.0000 [IU] | Freq: Three times a day (TID) | INTRAMUSCULAR | Status: DC
  Administered 2023-04-09 (×2): 2 [IU] via SUBCUTANEOUS

## 2023-04-08 MED ORDER — ENSURE ENLIVE PO LIQD
237.0000 mL | Freq: Two times a day (BID) | ORAL | Status: DC
  Administered 2023-04-09 – 2023-04-10 (×4): 237 mL via ORAL

## 2023-04-08 MED ORDER — INSULIN ASPART 100 UNIT/ML IJ SOLN: 0.0000 [IU] | Freq: Every day | INTRAMUSCULAR | Status: DC

## 2023-04-08 MED ORDER — PANTOPRAZOLE SODIUM 40 MG PO TBEC
40.0000 mg | DELAYED_RELEASE_TABLET | Freq: Two times a day (BID) | ORAL | Status: DC
  Administered 2023-04-09 – 2023-04-10 (×4): 40 mg via ORAL
  Filled 2023-04-08 (×4): qty 1

## 2023-04-08 MED ORDER — LORAZEPAM 0.5 MG PO TABS: 0.5000 mg | ORAL_TABLET | Freq: Three times a day (TID) | ORAL | Status: DC | PRN

## 2023-04-08 MED ORDER — FOLIC ACID 1 MG PO TABS
1.0000 mg | ORAL_TABLET | Freq: Every day | ORAL | Status: DC
Start: 2023-04-09 — End: 2023-04-10
  Administered 2023-04-09 – 2023-04-10 (×2): 1 mg via ORAL
  Filled 2023-04-08 (×2): qty 1

## 2023-04-08 MED ORDER — CARVEDILOL 12.5 MG PO TABS
12.5000 mg | ORAL_TABLET | Freq: Two times a day (BID) | ORAL | Status: DC
  Administered 2023-04-09 (×2): 12.5 mg via ORAL
  Filled 2023-04-08 (×3): qty 1

## 2023-04-08 MED ORDER — DULOXETINE HCL 30 MG PO CPEP
30.0000 mg | ORAL_CAPSULE | Freq: Every day | ORAL | Status: DC
  Administered 2023-04-09 – 2023-04-10 (×2): 30 mg via ORAL
  Filled 2023-04-08 (×2): qty 1

## 2023-04-08 MED ORDER — INSULIN GLARGINE-YFGN 100 UNIT/ML ~~LOC~~ SOLN
20.0000 [IU] | Freq: Every day | SUBCUTANEOUS | Status: DC
  Administered 2023-04-09 (×2): 20 [IU] via SUBCUTANEOUS
  Filled 2023-04-08 (×3): qty 0.2

## 2023-04-08 MED ORDER — ENOXAPARIN SODIUM 40 MG/0.4ML IJ SOSY
40.0000 mg | PREFILLED_SYRINGE | INTRAMUSCULAR | Status: DC
  Administered 2023-04-09 – 2023-04-10 (×2): 40 mg via SUBCUTANEOUS
  Filled 2023-04-08 (×2): qty 0.4

## 2023-04-08 MED ORDER — OXYCODONE HCL 5 MG PO TABS
15.0000 mg | ORAL_TABLET | ORAL | Status: DC | PRN
  Administered 2023-04-09 (×2): 20 mg via ORAL
  Administered 2023-04-09: 15 mg via ORAL
  Administered 2023-04-09: 20 mg via ORAL
  Administered 2023-04-09: 15 mg via ORAL
  Administered 2023-04-10 (×2): 20 mg via ORAL
  Filled 2023-04-08: qty 4
  Filled 2023-04-08: qty 3
  Filled 2023-04-08 (×3): qty 4
  Filled 2023-04-08: qty 3
  Filled 2023-04-08: qty 4

## 2023-04-08 MED ORDER — BUSPIRONE HCL 5 MG PO TABS
7.5000 mg | ORAL_TABLET | Freq: Two times a day (BID) | ORAL | Status: DC
  Administered 2023-04-09 – 2023-04-10 (×4): 7.5 mg via ORAL
  Filled 2023-04-08 (×4): qty 2

## 2023-04-08 MED ORDER — FERROUS SULFATE 325 (65 FE) MG PO TABS
325.0000 mg | ORAL_TABLET | Freq: Every day | ORAL | Status: DC
  Administered 2023-04-09 – 2023-04-10 (×2): 325 mg via ORAL
  Filled 2023-04-08 (×2): qty 1

## 2023-04-08 NOTE — ED Notes (Signed)
 This nurse called [facility] to have patient monitored.

## 2023-04-08 NOTE — Telephone Encounter (Signed)
 Received completed form from Dr. Jennette Kettle. Printed off updated office notes with addendum and faxed with form to [employer].   Copy of form made and placed in batch scanning.   Veronda Prude, RN

## 2023-04-08 NOTE — Progress Notes (Signed)
 DME bedside commode required as patient is severely limitedin ambulation and unable to ambulate to bathroom.   Route to RN team

## 2023-04-08 NOTE — ED Notes (Signed)
 ED TO INPATIENT HANDOFF REPORT  ED Nurse Name and Phone #: Marcie Bal RN, [phone]  S Name/Age/Gender Cassidy [name] 58 y.o. female Room/Bed: 034C/034C  Code Status   Code Status: Prior  Home/SNF/Other Home Patient oriented to: self, place, time, and situation Is this baseline? Yes   Triage Complete: Triage complete  Chief Complaint Anasarca [R60.1]  Triage Note The pt has stage 4 small-cell lung cancer she is followed at the cancer center at [facility]  she a drainage  tube in her abdomen that is not draining she is unable to drain  the tube because she needs  the equipment to drain it  it has not been drained in 4 weeks  she also wants to be seen for the swelling in both her knees    Allergies Allergies  Allergen Reactions   Amoxil [Amoxicillin] Hives    Has patient had a PCN reaction causing immediate rash, facial/tongue/throat swelling, SOB or lightheadedness with hypotension: Yes Has patient had a PCN reaction causing severe rash involving mucus membranes or skin necrosis: No Has patient had a PCN reaction that required hospitalization No Has patient had a PCN reaction occurring within the last 10 years: Yes If all of the above answers are "NO", then may proceed with Cephalosporin use.   Chantix [Varenicline] Other (See Comments)    Nightmares Hallucinations     Suboxone [Buprenorphine Hcl-Naloxone Hcl] Nausea And Vomiting   Emend [Fosaprepitant Dimeglumine] Itching    See notes for transfusion on 8/23. Pt reported new onset of itching after Emend infusion was started. 25mg  Benadryl IV given per MD. Pt tolerated remaining infusion well.     Level of Care/Admitting Diagnosis ED Disposition     ED Disposition  Admit   Condition  --   Comment  Hospital Area: [facility] [[id number]]  Level of Care: Telemetry Cardiac [[id number]]  May admit patient to [facility] or [facility] if equivalent level of care is available:: Yes  Covid Evaluation:  Asymptomatic - no recent exposure (last 10 days) testing not required  Diagnosis: Anasarca [[id number]]  Admitting Physician: Rometta Emery [[id number]]  Attending Physician: Rometta Emery [[id number]]  Certification:: I certify this patient will need inpatient services for at least 2 midnights  Expected Medical Readiness: 04/11/2023          B Medical/Surgery History Past Medical History:  Diagnosis Date   Adenocarcinoma of right lung, stage 4 (HCC) 08/26/2021   Anemia    has sickle cell trait   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  s/p PCTA to dRCA and ostial RPAV-PLA vessel + PDA branch (05/2012)  b. [place] s/p DES to mLAD - Resolute DES 3.0 x 22 (3.55mm -->3.3 mm)   Bipolar depression (HCC)    Breast abscess    a. right side.    COPD (chronic obstructive pulmonary disease) (HCC)    Depression    Diabetic peripheral neuropathy (HCC)    Fibromyalgia    Genital warts    GERD (gastroesophageal reflux disease)    History of hiatal hernia    HLD (hyperlipidemia)    Hypertension    Lung cancer (HCC)    Pain in limb    a. LE VENOUS DUPLEX, 02/05/2009 - no evidence of deep vein thrombosis, Baker's cyst   Pneumonia    PTSD (post-traumatic stress disorder)    Sickle cell trait (HCC)    Tobacco abuse  Tooth caries    Type II diabetes mellitus (HCC)    Past Surgical History:  Procedure Laterality Date   BLADDER SURGERY  1980   "TVT"   BLADDER SURGERY  2003   "TVT"   BREAST EXCISIONAL BIOPSY     BREAST SURGERY Right    I&D for multiple abscesses   CARDIAC CATHETERIZATION     CORONARY ARTERY BYPASS GRAFT N/A 06/30/2021   Procedure: CORONARY ARTERY BYPASS GRAFTING (CABG) x3 USING LEFT INTERNAL MAMMARY ARTERY,  RIGHT RADIAL ARTERY AND LEFT GREATER SAPHENOUS VEIN;  Surgeon: Corliss Skains, MD;  Location: [facility] OR;  Service: Open Heart Surgery;  Laterality: N/A;   cutting balloon     ENDOVEIN HARVEST OF GREATER SAPHENOUS  VEIN Left 06/30/2021   Procedure: ENDOVEIN HARVEST OF GREATER SAPHENOUS VEIN;  Surgeon: Corliss Skains, MD;  Location: [facility] OR;  Service: Open Heart Surgery;  Laterality: Left;   EYE SURGERY Left    laser surgery   FINE NEEDLE ASPIRATION  08/21/2021   Procedure: FINE NEEDLE ASPIRATION (FNA) LINEAR;  Surgeon: Josephine Igo, DO;  Location: [facility] ENDOSCOPY;  Service: Pulmonary;;   INCISE AND DRAIN ABCESS  ; 04/26/11; 08/13/11   right breast   INCISION AND DRAINAGE ABSCESS Right 05/09/2012   Procedure: INCISION AND DRAINAGE RIGHT BREAST ABSCESS;  Surgeon: Wilmon Arms. Corliss Skains, MD;  Location: [facility] OR;  Service: General;  Laterality: Right;   INCISION AND DRAINAGE ABSCESS Right 02/08/2014   Procedure: INCISION AND DRAINAGE RIGHT BREAST ABSCESS;  Surgeon: Manus Rudd, MD;  Location: [facility] OR;  Service: General;  Laterality: Right;   INCISION AND DRAINAGE ABSCESS Right 06/14/2014   Procedure: INCISION AND DRAINAGE RIGHT BREAST ABSCESS;  Surgeon: Manus Rudd, MD;  Location: [facility] OR;  Service: General;  Laterality: Right;   IR IMAGING GUIDED PORT INSERTION  12/08/2021   IR PARACENTESIS  02/02/2023   IR PARACENTESIS  02/23/2023   IR PERC TUN PERIT CATH WO PORT S&I /IMAG  02/16/2023   IRRIGATION AND DEBRIDEMENT ABSCESS  12/19/2010   Procedure: IRRIGATION AND DEBRIDEMENT ABSCESS;  Surgeon: Emelia Loron, MD;  Location: [facility];  Service: General;  Laterality: Right;   IRRIGATION AND DEBRIDEMENT ABSCESS  08/13/2011   Procedure: MINOR INCISION AND DRAINAGE OF ABSCESS;  Surgeon: Cherylynn Ridges, MD;  Location: [facility];  Service: General;  Laterality: Right;  Right Breast    IRRIGATION AND DEBRIDEMENT ABSCESS  11/17/2011   Procedure: IRRIGATION AND DEBRIDEMENT ABSCESS;  Surgeon: Wilmon Arms. Corliss Skains, MD;  Location: [facility];  Service: General;  Laterality: Right;  irrigation and debridement right recurrent breast abscess   LARYNX SURGERY     LEFT HEART CATH AND CORONARY ANGIOGRAPHY N/A 04/09/2021   Procedure: LEFT HEART CATH  AND CORONARY ANGIOGRAPHY;  Surgeon: Corky Crafts, MD;  [facility] INVASIVE CV LAB;  Service: Cardiovascular;  Laterality: N/A;   LEFT HEART CATHETERIZATION WITH CORONARY ANGIOGRAM N/A 05/29/2012   Procedure: LEFT HEART CATHETERIZATION WITH CORONARY ANGIOGRAM & PTCA;  Surgeon: Lennette Bihari, MD;  Location: [facility] CATH LAB;  Service: Cardiovascular; Bifurcation dRCA-RPAD/PLA & rPDA  PTCA.   LEFT HEART CATHETERIZATION WITH CORONARY ANGIOGRAM N/A 11/08/2013   Procedure: LEFT HEART CATHETERIZATION WITH CORONARY ANGIOGRAM and Coronary Stent Intervention;  Surgeon: Marykay Lex, MD;  Location: [facility] CATH LAB;  Service: Cardiovascular; mLAD 80% (FFR 0.73) - resolute DES 3.0 x 22 mm (postdilated to 3.3 mm->3.5 mm)   NM MYOVIEW LTD  01/26/2009   Normal study, no evidence of ischemia, EF 67%  ployp removed from voice box 03/30/12     RADIAL ARTERY HARVEST Right 06/30/2021   Procedure: RADIAL ARTERY HARVEST;  Surgeon: Corliss Skains, MD;  Location[facility];  Service: Open Heart Surgery;  Laterality: Right;   REFRACTIVE SURGERY  ~ 2010   right   TEE WITHOUT CARDIOVERSION N/A 06/30/2021   Procedure: TRANSESOPHAGEAL ECHOCARDIOGRAM (TEE);  Surgeon: Corliss Skains, MD;  Location: [facility] OR;  Service: Open Heart Surgery;  Laterality: N/A;   TONSILLECTOMY  1988   VIDEO BRONCHOSCOPY WITH ENDOBRONCHIAL ULTRASOUND Bilateral 08/21/2021   Procedure: VIDEO BRONCHOSCOPY WITH ENDOBRONCHIAL ULTRASOUND;  Surgeon: Josephine Igo, DO;  Location: [facility] ENDOS[facility];  Service: Pulmonary;  Laterality: Bilateral;     A IV Location/Drains/Wounds Patient Lines/Drains/Airways Status     Active Line/Drains/Airways     Name Placement date Placement time Site Days   Implanted Port 12/08/21 Left Chest 12/08/21  1529  Chest  486   Peripheral IV 04/08/23 20 G 1" Anterior;Distal;Right Forearm 04/08/23  1928  Forearm  less than 1   Closed System Drain 1 Right;Lateral Abdomen Other (Comment) 15.5 Fr. 02/16/23  1222  Abdomen  51             Intake/Output Last 24 hours No intake or output data in the 24 hours ending 04/08/23 2136  Labs/Imaging Results for orders placed or performed during the hospital encounter of 04/08/23 (from the past 48 hours)  Comprehensive metabolic panel     Status: Abnormal   Collection Time: 04/08/23  6:22 PM  Result Value Ref Range   Sodium 136 135 - 145 mmol/L   Potassium 3.4 (L) 3.5 - 5.1 mmol/L   Chloride 106 98 - 111 mmol/L   CO2 21 (L) 22 - 32 mmol/L   Glucose, Bld 129 (H) 70 - 99 mg/dL    Comment: Glucose reference range applies only to samples taken after fasting for at least 8 hours.   BUN 17 6 - 20 mg/dL   Creatinine, Ser 5.62 (H) 0.44 - 1.00 mg/dL   Calcium 7.8 (L) 8.9 - 10.3 mg/dL   Total Protein 4.7 (L) 6.5 - 8.1 g/dL   Albumin 1.9 (L) 3.5 - 5.0 g/dL   AST 19 15 - 41 U/L   ALT 10 0 - 44 U/L   Alkaline Phosphatase 64 38 - 126 U/L   Total Bilirubin 0.4 0.0 - 1.2 mg/dL   GFR, Estimated 52 (L) >60 mL/min    Comment: (NOTE) Calculated using the CKD-EPI Creatinine Equation (2021)    Anion gap 9 5 - 15    Comment: Performed at [facility], [address], [place], [place] [address]  CBC with Differential     Status: Abnormal   Collection Time: 04/08/23  6:22 PM  Result Value Ref Range   WBC 4.5 4.0 - 10.5 K/uL   RBC 2.74 (L) 3.87 - 5.11 MIL/uL   Hemoglobin 8.5 (L) 12.0 - 15.0 g/dL   HCT 57.8 (L) 46.9 - 62.9 %   MCV 96.4 80.0 - 100.0 fL   MCH 31.0 26.0 - 34.0 pg   MCHC 32.2 30.0 - 36.0 g/dL   RDW 52.8 (H) 41.3 - 24.4 %   Platelets 284 150 - 400 K/uL   nRBC 0.0 0.0 - 0.2 %   Neutrophils Relative % 62 %   Neutro Abs 2.8 1.7 - 7.7 K/uL   Lymphocytes Relative 22 %   Lymphs Abs 1.0 0.7 - 4.0 K/uL   Monocytes Relative 13 %  Monocytes Absolute 0.6 0.1 - 1.0 K/uL   Eosinophils Relative 1 %   Eosinophils Absolute 0.0 0.0 - 0.5 K/uL   Basophils Relative 1 %   Basophils Absolute 0.0 0.0 - 0.1 K/uL   Immature Granulocytes 1 %   Abs Immature Granulocytes  0.04 0.00 - 0.07 K/uL    Comment: Performed at [facility], [address], [place], [place] [address]  Lipase, blood     Status: None   Collection Time: 04/08/23  6:22 PM  Result Value Ref Range   Lipase 29 11 - 51 U/L    Comment: Performed at [facility], [address], [place], [place] [address]  I-Stat CG4 Lactic Acid     Status: None   Collection Time: 04/08/23  6:37 PM  Result Value Ref Range   Lactic Acid, Venous 1.7 0.5 - 1.9 mmol/L  I-stat chem 8, ED (not at [facility], [facility] or [facility])     Status: Abnormal   Collection Time: 04/08/23  6:42 PM  Result Value Ref Range   Sodium 137 135 - 145 mmol/L   Potassium 3.4 (L) 3.5 - 5.1 mmol/L   Chloride 104 98 - 111 mmol/L   BUN 18 6 - 20 mg/dL   Creatinine, Ser 0.98 (H) 0.44 - 1.00 mg/dL   Glucose, Bld 119 (H) 70 - 99 mg/dL    Comment: Glucose reference range applies only to samples taken after fasting for at least 8 hours.   Calcium, Ion 1.04 (L) 1.15 - 1.40 mmol/L   TCO2 23 22 - 32 mmol/L   Hemoglobin 8.5 (L) 12.0 - 15.0 g/dL   HCT 14.7 (L) 82.9 - 56.2 %  Brain natriuretic peptide     Status: None   Collection Time: 04/08/23  7:30 PM  Result Value Ref Range   B Natriuretic Peptide 78.7 0.0 - 100.0 pg/mL    Comment: Performed at [facility], [address] [place], [place] [address]  I-Stat CG4 Lactic Acid     Status: None   Collection Time: 04/08/23  8:51 PM  Result Value Ref Range   Lactic Acid, Venous 1.2 0.5 - 1.9 mmol/L   *Note: Due to a large number of results and/or encounters for the requested time period, some results have not been displayed. A complete set of results can be found in Results Review.   DG Chest Portable 1 View Result Date: 04/08/2023 CLINICAL DATA:  Fluid overload. EXAM: PORTABLE CHEST 1 VIEW COMPARISON:  October 29, 2022. FINDINGS: The heart size and mediastinal contours are within normal limits. Status post coronary artery bypass graft. Left internal jugular Port-A-Cath is unchanged. Both  lungs are clear. The visualized skeletal structures are unremarkable. IMPRESSION: No active disease. Electronically Signed   By: Lupita Raider M.D.   On: 04/08/2023 19:27    Pending Labs Unresulted Labs (From admission, onward)    None       Vitals/Pain Today's Vitals   04/08/23 1808 04/08/23 1827 04/08/23 1920 04/08/23 2119  BP: 111/68   114/73  Pulse: 81   74  Resp: 15   10  Temp: 98.1 F (36.7 C)     TempSrc: Oral     SpO2: 100%   100%  Weight:  86.2 kg    Height:  5\' 7"  (1.702 m)    PainSc:  8  8      Isolation Precautions No active isolations  Medications Medications  furosemide (LASIX) injection 80 mg (80  mg Intravenous Given 04/08/23 2123)    Mobility walks with device     Focused Assessments Peripheral vascular assessment   R Recommendations: See Admitting Provider Note  Report given to:   Additional Notes:

## 2023-04-08 NOTE — ED Triage Notes (Signed)
 The pt has stage 4 small-cell lung cancer she is followed at the cancer center at [facility]  she a drainage  tube in her abdomen that is not draining she is unable to drain  the tube because she needs  the equipment to drain it  it has not been drained in 4 weeks  she also wants to be seen for the swelling in both her knees

## 2023-04-08 NOTE — ED Provider Triage Note (Signed)
 Emergency Medicine Provider Triage Evaluation Note  Cassidy [name] , a 58 y.o. female  was evaluated in triage.  Pt complains of swelling, abdominal pain.  History of metastatic lung cancer with chronic recurrent ascites.  She normally drains her own abdomen at home and has a chronic indwelling catheter in her abdomen.  She states that she has not been sent her kit for drainage of her abdomen for the past 4 weeks and now has grossly swollen abdomen breasts legs and is having severe pain.  She has had subjective fever at home with abdominal pain.  Review of Systems  Positive: Swelling, anasarca Negative: Vomiting   Physical Exam  BP 111/68 (BP Location: Right Arm)   Pulse 81   Temp 98.1 F (36.7 C) (Oral)   Resp 15   LMP 06/12/2016 (Approximate)   SpO2 100%  Gen:   Awake, no distress   Resp:  Normal effort  MSK:   Moves extremities without difficulty  Other:  Patient with soft but swollen abdomen which is tender to palpation warm to the touch.  Significant swelling into the lower extremities.  Medical Decision Making  Medically screening exam initiated at 6:21 PM.  Appropriate orders placed.  TY OSHIMA was informed that the remainder of the evaluation will be completed by another provider, this initial triage assessment does not replace that evaluation, and the importance of remaining in the ED until their evaluation is complete.     Arthor Captain, PA-C 04/08/23 [phone]

## 2023-04-08 NOTE — Progress Notes (Signed)
 The patient is admitted from ED to 3 E 04. She's A & O x 4. The patient oriented to her room,ascom /call bell and staff. Full assessment to epic completed. Will continue to monitor.

## 2023-04-08 NOTE — ED Provider Notes (Signed)
 Emergency Department Provider Note   I have reviewed the triage vital signs and the nursing notes.   HISTORY  Chief Complaint fluid over load   HPI Cassidy [name] is a 58 y.o. female with past history reviewed below presents emergency department with fluid overload, increased leg heaviness, shortness of breath.  Symptoms been developing over the past 1 to 2 weeks.  She has been compliant with her medication and continues to urinate but having increased difficulty.  She states her legs are heavy to the point where she has difficulty walking and requires significant assistance to get around the house, often having others carry her.  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  s/p PCTA to dRCA and ostial RPAV-PLA vessel + PDA branch (05/2012)  b. [place] s/p DES to mLAD - Resolute DES 3.0 x 22 (3.36mm -->3.3 mm)   Bipolar depression (HCC)    Breast abscess    a. right side.    COPD (chronic obstructive pulmonary disease) (HCC)    Depression    Diabetic peripheral neuropathy (HCC)    Fibromyalgia    Genital warts    GERD (gastroesophageal reflux disease)    History of hiatal hernia    HLD (hyperlipidemia)    Hypertension    Lung cancer (HCC)    Pain in limb    a. LE VENOUS DUPLEX, 02/05/2009 - no evidence of deep vein thrombosis, Baker's cyst   Pneumonia    PTSD (post-traumatic stress disorder)    Sickle cell trait (HCC)    Tobacco abuse    Tooth caries    Type II diabetes mellitus (HCC)     Review of Systems  Constitutional: No fever/chills Cardiovascular: Denies chest pain. Positive leg swelling.  Respiratory: Positive shortness of breath. Gastrointestinal: No abdominal pain.  No nausea, no vomiting.   Musculoskeletal: Negative for back pain. Skin: Negative for  rash.   ____________________________________________   PHYSICAL EXAM:  VITAL SIGNS: ED Triage Vitals  Encounter Vitals Group     BP 04/08/23 1808 111/68     Pulse Rate 04/08/23 1808 81     Resp 04/08/23 1808 15     Temp 04/08/23 1808 98.1 F (36.7 C)     Temp Source 04/08/23 1808 Oral     SpO2 04/08/23 1808 100 %     Weight 04/08/23 1827 190 lb (86.2 kg)     Height 04/08/23 1827 5\' 7"  (1.702 m)   Constitutional: Alert and oriented. Well appearing and in no acute distress. Eyes: Conjunctivae are normal. Head: Atraumatic. Nose: No congestion/rhinnorhea. Mouth/Throat: Mucous membranes are moist.   Neck: No stridor.  Cardiovascular: Normal rate, regular rhythm. Good peripheral circulation. Grossly normal heart sounds.   Respiratory: Normal respiratory effort.  No retractions. Lungs CTAB. Gastrointestinal: Soft and nontender. No distention.  Musculoskeletal: 4+ pitting edema to the bilateral LEs Neurologic:  Normal speech and language. No gross focal neurologic deficits are appreciated.  Skin:  Skin is warm, dry and intact. No rash noted.  ____________________________________________   LABS (all labs ordered are listed, but only abnormal results are displayed)  Labs Reviewed  COMPREHENSIVE METABOLIC PANEL WITH GFR - Abnormal; Notable for the following components:      Result Value   Potassium 3.4 (*)    CO2 21 (*)  Glucose, Bld 129 (*)    Creatinine, Ser 1.21 (*)    Calcium 7.8 (*)    Total Protein 4.7 (*)    Albumin 1.9 (*)    GFR, Estimated 52 (*)    All other components within normal limits  CBC WITH DIFFERENTIAL/PLATELET - Abnormal; Notable for the following components:   RBC 2.74 (*)    Hemoglobin 8.5 (*)    HCT 26.4 (*)    RDW 17.6 (*)    All other components within normal limits  CREATININE, SERUM - Abnormal; Notable for the following components:   Creatinine, Ser 1.09 (*)    GFR, Estimated 59 (*)    All other components within normal limits  CBC -  Abnormal; Notable for the following components:   RBC 3.13 (*)    Hemoglobin 9.9 (*)    HCT 29.3 (*)    RDW 17.8 (*)    All other components within normal limits  COMPREHENSIVE METABOLIC PANEL WITH GFR - Abnormal; Notable for the following components:   Potassium 3.0 (*)    Glucose, Bld 105 (*)    Creatinine, Ser 1.11 (*)    Calcium 7.9 (*)    Total Protein 5.1 (*)    Albumin 1.9 (*)    GFR, Estimated 57 (*)    All other components within normal limits  GLUCOSE, CAPILLARY - Abnormal; Notable for the following components:   Glucose-Capillary 121 (*)    All other components within normal limits  GLUCOSE, CAPILLARY - Abnormal; Notable for the following components:   Glucose-Capillary 129 (*)    All other components within normal limits  COMPREHENSIVE METABOLIC PANEL WITH GFR - Abnormal; Notable for the following components:   Potassium 3.1 (*)    Glucose, Bld 124 (*)    Creatinine, Ser 1.07 (*)    Calcium 7.7 (*)    Total Protein 4.7 (*)    Albumin 1.7 (*)    GFR, Estimated 60 (*)    All other components within normal limits  GLUCOSE, CAPILLARY - Abnormal; Notable for the following components:   Glucose-Capillary 142 (*)    All other components within normal limits  CBC - Abnormal; Notable for the following components:   RBC 2.78 (*)    Hemoglobin 8.8 (*)    HCT 25.7 (*)    RDW 18.0 (*)    All other components within normal limits  COMPREHENSIVE METABOLIC PANEL WITH GFR - Abnormal; Notable for the following components:   Potassium 3.4 (*)    Glucose, Bld 69 (*)    Calcium 7.9 (*)    Total Protein 4.3 (*)    Albumin 1.8 (*)    AST 14 (*)    All other components within normal limits  GLUCOSE, CAPILLARY - Abnormal; Notable for the following components:   Glucose-Capillary 117 (*)    All other components within normal limits  I-STAT CHEM 8, ED - Abnormal; Notable for the following components:   Potassium 3.4 (*)    Creatinine, Ser 1.20 (*)    Glucose, Bld 125 (*)     Calcium, Ion 1.04 (*)    Hemoglobin 8.5 (*)    HCT 25.0 (*)    All other components within normal limits  LIPASE, BLOOD  BRAIN NATRIURETIC PEPTIDE  HEMOGLOBIN A1C  GLUCOSE, CAPILLARY  GLUCOSE, CAPILLARY  GLUCOSE, CAPILLARY  I-STAT CG4 LACTIC ACID, ED  I-STAT CG4 LACTIC ACID, ED   ____________________________________________  EKG   EKG Interpretation Date/Time:  Friday April 08 2023 19:21:35  EDT Ventricular Rate:  76 PR Interval:  169 QRS Duration:  90 QT Interval:  379 QTC Calculation: 427 R Axis:   29  Text Interpretation: Sinus rhythm Low voltage, extremity and precordial leads Confirmed by Alona Bene [phone]) on 04/08/2023 7:32:07 PM Also confirmed by Alona Bene [phone]), editor Harmon Pier, Ashwini ([id number])  on 04/09/2023 8:44:30 AM        ____________________________________________  RADIOLOGY  DG Chest Portable 1 View Result Date: 04/08/2023 CLINICAL DATA:  Fluid overload. EXAM: PORTABLE CHEST 1 VIEW COMPARISON:  October 29, 2022. FINDINGS: The heart size and mediastinal contours are within normal limits. Status post coronary artery bypass graft. Left internal jugular Port-A-Cath is unchanged. Both lungs are clear. The visualized skeletal structures are unremarkable. IMPRESSION: No active disease. Electronically Signed   By: Lupita Raider M.D.   On: 04/08/2023 19:27    ____________________________________________   PROCEDURES  Procedure(s) performed:   Procedures  CRITICAL CARE Performed by: Maia Plan Total critical care time: 35 minutes Critical care time was exclusive of separately billable procedures and treating other patients. Critical care was necessary to treat or prevent imminent or life-threatening deterioration. Critical care was time spent personally by me on the following activities: development of treatment plan with patient and/or surrogate as well as nursing, discussions with consultants, evaluation of patient's response to  treatment, examination of patient, obtaining history from patient or surrogate, ordering and performing treatments and interventions, ordering and review of laboratory studies, ordering and review of radiographic studies, pulse oximetry and re-evaluation of patient's condition.  Alona Bene, MD Emergency Medicine  ____________________________________________   INITIAL IMPRESSION / ASSESSMENT AND PLAN / ED COURSE  Pertinent labs & imaging results that were available during my care of the patient were reviewed by me and considered in my medical decision making (see chart for details).   This patient is Presenting for Evaluation of fluid overload, which does require a range of treatment options, and is a complaint that involves a high risk of morbidity and mortality.  The Differential Diagnoses include renal failure, CHF exacerbation, medication noncompliance, anasarca, etc.  Critical Interventions-    Medications  furosemide (LASIX) injection 80 mg (80 mg Intravenous Given 04/08/23 2123)  albumin human 25 % solution 25 g (25 g Intravenous New Bag/Given 04/09/23 1517)  potassium chloride SA (KLOR-CON M) CR tablet 60 mEq (60 mEq Oral Given 04/09/23 1502)  potassium chloride (KLOR-CON) packet 40 mEq (40 mEq Oral Given 04/10/23 1037)  heparin lock flush 100 unit/mL (500 Units Intracatheter Given 04/10/23 1332)    Reassessment after intervention: Patient beginning to diurese.   Clinical Laboratory Tests Ordered, included CMP without acute kidney injury.  Mild anemia at 8.5.  Lipase normal.  Radiologic Tests Ordered, included CXR. I independently interpreted the images and agree with radiology interpretation.   Cardiac Monitor Tracing which shows NSR.  Consult complete with [facility]. Plan to admit.   Medical Decision Making: Summary:  The patient presents emergency department with fluid overload, increased leg swelling.  Presents with clinical anasarca.  IV Lasix started and patient beginning to  diurese.  Will require significant diuresis in the inpatient setting.  Discussed this with patient on reevaluation and she is in agreement with plan.  Patient's presentation is most consistent with acute presentation with potential threat to life or bodily function.   Disposition: admit  ____________________________________________  FINAL CLINICAL IMPRESSION(S) / ED DIAGNOSES  Final diagnoses:  Other ascites  Adenocarcinoma of right lung, stage 4 (HCC)  Note:  This document was prepared using Dragon voice recognition software and may include unintentional dictation errors.  Alona Bene, MD, [facility] Emergency Medicine    Deshone Lyssy, Arlyss Repress, MD 04/15/23 [phone]

## 2023-04-08 NOTE — Telephone Encounter (Signed)
 Received returned call from [employer].   Insurance is requiring that office visit addendum include the dx codes that are specified on prescription.   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   Once office note from 03/29/23 is addended with these dx codes, Dr. Jennette Kettle will need to cosign.   Dr. Jennette Kettle will also need to sign off on updated rx, as [employer] states that prior rx was illegible.   Veronda Prude, RN

## 2023-04-09 ENCOUNTER — Inpatient Hospital Stay (HOSPITAL_COMMUNITY)

## 2023-04-09 DIAGNOSIS — C3491 Malignant neoplasm of unspecified part of right bronchus or lung: Secondary | ICD-10-CM | POA: Diagnosis not present

## 2023-04-09 DIAGNOSIS — R188 Other ascites: Secondary | ICD-10-CM

## 2023-04-09 DIAGNOSIS — I5031 Acute diastolic (congestive) heart failure: Secondary | ICD-10-CM

## 2023-04-09 DIAGNOSIS — Z789 Other specified health status: Secondary | ICD-10-CM

## 2023-04-09 LAB — ECHOCARDIOGRAM COMPLETE
Area-P 1/2: 4.29 cm2
Height: 67 in
S' Lateral: 2.5 cm
Weight: 3245.17 [oz_av]

## 2023-04-09 LAB — CREATININE, SERUM
Creatinine, Ser: 1.09 mg/dL — ABNORMAL HIGH (ref 0.44–1.00)
GFR, Estimated: 59 mL/min — ABNORMAL LOW (ref >60)

## 2023-04-09 LAB — COMPREHENSIVE METABOLIC PANEL WITH GFR
ALT: 11 U/L (ref 0–44)
ALT: 9 U/L (ref 0–44)
AST: 18 U/L (ref 15–41)
AST: 16 U/L (ref 15–41)
Albumin: 1.9 g/dL — ABNORMAL LOW (ref 3.5–5.0)
Albumin: 1.7 g/dL — ABNORMAL LOW (ref 3.5–5.0)
Alkaline Phosphatase: 66 U/L (ref 38–126)
Alkaline Phosphatase: 64 U/L (ref 38–126)
Anion gap: 9 (ref 5–15)
Anion gap: 7 (ref 5–15)
BUN: 16 mg/dL (ref 6–20)
BUN: 16 mg/dL (ref 6–20)
CO2: 24 mmol/L (ref 22–32)
CO2: 27 mmol/L (ref 22–32)
Calcium: 7.9 mg/dL — ABNORMAL LOW (ref 8.9–10.3)
Calcium: 7.7 mg/dL — ABNORMAL LOW (ref 8.9–10.3)
Chloride: 104 mmol/L (ref 98–111)
Chloride: 104 mmol/L (ref 98–111)
Creatinine, Ser: 1.11 mg/dL — ABNORMAL HIGH (ref 0.44–1.00)
Creatinine, Ser: 1.07 mg/dL — ABNORMAL HIGH (ref 0.44–1.00)
GFR, Estimated: 57 mL/min — ABNORMAL LOW (ref >60)
GFR, Estimated: 60 mL/min — ABNORMAL LOW (ref >60)
Glucose, Bld: 105 mg/dL — ABNORMAL HIGH (ref 70–99)
Glucose, Bld: 124 mg/dL — ABNORMAL HIGH (ref 70–99)
Potassium: 3 mmol/L — ABNORMAL LOW (ref 3.5–5.1)
Potassium: 3.1 mmol/L — ABNORMAL LOW (ref 3.5–5.1)
Sodium: 137 mmol/L (ref 135–145)
Sodium: 138 mmol/L (ref 135–145)
Total Bilirubin: 0.5 mg/dL (ref 0.0–1.2)
Total Bilirubin: 0.4 mg/dL (ref 0.0–1.2)
Total Protein: 5.1 g/dL — ABNORMAL LOW (ref 6.5–8.1)
Total Protein: 4.7 g/dL — ABNORMAL LOW (ref 6.5–8.1)

## 2023-04-09 LAB — CBC
HCT: 29.3 % — ABNORMAL LOW (ref 36.0–46.0)
Hemoglobin: 9.9 g/dL — ABNORMAL LOW (ref 12.0–15.0)
MCH: 31.6 pg (ref 26.0–34.0)
MCHC: 33.8 g/dL (ref 30.0–36.0)
MCV: 93.6 fL (ref 80.0–100.0)
Platelets: 306 10*3/uL (ref 150–400)
RBC: 3.13 MIL/uL — ABNORMAL LOW (ref 3.87–5.11)
RDW: 17.8 % — ABNORMAL HIGH (ref 11.5–15.5)
WBC: 5.4 10*3/uL (ref 4.0–10.5)
nRBC: 0 % (ref 0.0–0.2)

## 2023-04-09 LAB — GLUCOSE, CAPILLARY
Glucose-Capillary: 129 mg/dL — ABNORMAL HIGH (ref 70–99)
Glucose-Capillary: 94 mg/dL (ref 70–99)
Glucose-Capillary: 142 mg/dL — ABNORMAL HIGH (ref 70–99)
Glucose-Capillary: 117 mg/dL — ABNORMAL HIGH (ref 70–99)

## 2023-04-09 LAB — HEMOGLOBIN A1C
Hgb A1c MFr Bld: 5.5 % (ref 4.8–5.6)
Mean Plasma Glucose: 111.15 mg/dL

## 2023-04-09 MED ORDER — POLYETHYLENE GLYCOL 3350 17 G PO PACK: 17.0000 g | PACK | Freq: Every day | ORAL | Status: DC | PRN

## 2023-04-09 MED ORDER — ALBUMIN HUMAN 25 % IV SOLN
25.0000 g | Freq: Once | INTRAVENOUS | Status: AC
  Administered 2023-04-09: 25 g via INTRAVENOUS
  Filled 2023-04-09: qty 100

## 2023-04-09 MED ORDER — CHLORHEXIDINE GLUCONATE CLOTH 2 % EX PADS
6.0000 | MEDICATED_PAD | Freq: Every day | CUTANEOUS | Status: DC
  Administered 2023-04-09 – 2023-04-10 (×2): 6 via TOPICAL

## 2023-04-09 MED ORDER — POTASSIUM CHLORIDE CRYS ER 20 MEQ PO TBCR
60.0000 meq | EXTENDED_RELEASE_TABLET | Freq: Once | ORAL | Status: AC
  Administered 2023-04-09: 60 meq via ORAL
  Filled 2023-04-09: qty 3

## 2023-04-09 MED ORDER — SODIUM CHLORIDE 0.9% FLUSH
10.0000 mL | Freq: Two times a day (BID) | INTRAVENOUS | Status: DC
Start: 2023-04-09 — End: 2023-04-10
  Administered 2023-04-09 – 2023-04-10 (×2): 10 mL

## 2023-04-09 MED ORDER — SODIUM CHLORIDE 0.9% FLUSH: 10.0000 mL | INTRAVENOUS | Status: DC | PRN

## 2023-04-09 NOTE — Hospital Course (Addendum)
 Cassidy [name] is a 58 year old female with past medical history significant for adenocarcinoma of the lungs, CAD (s/p percutaneous coronary angioplasty), depression, fibromyalgia, GERD.  She presented with worsening ascites/anasarca, and inability to fully drain her Pleurx catheter at home due to DME supply issue.  Ascites  anasarca Stable renal function, baseline Cr 1.0-1.2. No signs of liver failure. Echocardiograms LVEF 65-70%, no signs of heart failure.  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 Most likely etiology is her stage IV lung cancer. Pleurex catheter is functional (however patient ran out appropriate supplies at home). IR paracentesis removed 2L, and patient's leg edema improved with IV lasix.  Transitioned to oral diuresis prior to discharge, torsemide 40 mg daily.   Adenocarcinoma right lung, stage IV Stage IV non-small cell lung cancer, adenocarcinoma diagnosed in August 2023.  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  Additionally had 19 cycles of systemic chemotherapy-discontinue treatment in September 2024 due to side effects and quality of life.  She has been following with palliative medicine outpatient since.  She was given a Pleurx catheter for palliative measures regarding ascites (see above).  Patient is aware of her poor long-term prognosis.  Palliative care video visit on 04/06/2023 encouraged patient to seek care at [facility] with regard to worsening ascites.  Goals of care discussion during hospitalization, patient is not ready to transition to Hospice at this time.  Other chronic conditions were medically managed with home medications and formulary alternatives as necessary (bipolar 1, PTSD, GERD, HTN, T2DM).  Follow-up recommendations: 1) increase torsemide to 40 mg daily on discharge; consider titration as necessary. 2) Ensure proper Pleurx drain supplies with HH.

## 2023-04-09 NOTE — H&P (Addendum)
 Hospital Admission History and Physical Service Pager: [phone]  Patient name: Cassidy [name] Medical record number: [id number] Date of Birth: 08-29-1965 Age: 58 y.o. Gender: female  Primary Care Provider: Alfredo Martinez, MD Consultants: none Code Status: DNR - Comfort  Preferred Emergency Contact:  Extended Emergency Contact Information Primary Emergency Contact: Caradonna-Bailey,Marquis Address: [address]           [address] [place], [place] [address] [place] Home Phone: [phone] Mobile Phone: [phone] Relation: Son Secondary Emergency Contact: Ivor Costa [place], [place] [address] [place] Phone: [phone] Relation: Brother   Chief Complaint: LE swelling  Assessment and Plan: SAVHANNA SLIVA is a 58 y.o. female with known stage IV lung cancer and history of ascites and anasarca, presenting with LE swelling. Differential for presentation of this includes worsening anasarca, DVT.   - Suspect worsening of anasarca given pt history and prior workup. - Low suspicion for DVT given this is chronic bilateral problem for the pt; on exam no evidence of erythema, warmth, increased pain to calves.  Assessment & Plan Anasarca H/o stage IV non-small cell lung cancer with frequent accumulation of ascites requiring paracentesis. Seen in [facility] clinic 3/18 with anasarca no longer responding to lasix; offered admission at that time but pt declined, and presents now to ED for same. S/p 80 mg IV lasix in the ED. Interested in Palliative discussion but unsure if she is ready for Hospice at this time; she has previously felt pressured to choose Hospice and does not want that to happen again. - Admit to FMTS, attending Dr. Jennette Kettle - Med tele, Vital signs per floor - AM CBC, CMP  - pain regimen: oxycodone 15-30 mg q4 PRN moderate pain, fentanyl patch q3 days - continue spironolactone 50 mg daily - lasix IV 40 mg BID - Palliative  consult Ascites Pt has pleurx drain placed by IR for refractory ascites. Has been having difficulty draining it lately due to incorrect supplies, possible clog. - IR consult for drain management Hypokalemia K 3.4 on presentation. - AM CMP to trend K Chronic health problem Bipolar 1, PTSD: continue home buspar 7.5 mg BID, duloxetine 30 mg daily, ativan 0.5 mg q8h PRN GERD: pantoprazole 40 mg BID HTN: continue home coreg 12.5 mg BID, spironolactone 50 mg at bedtime T2DM: mSSI   FEN/GI: Hear Healthy diet; Miralax PRN VTE Prophylaxis: Lovenox  Disposition: med tele  History of Present Illness:  Cassidy [name] is a 58 y.o. female presenting with bilateral LE swelling consistent with worsening anasarca no longer responsive to home diuretic regimen. She has noticed worsening swelling over the last 6 weeks and has been having trouble with her abdominal pleurx drain supplies. She reports constipation, worsening abdominal fluid retention, decreased appetite. Also notes "fullness" of chest, but no frank shortness of breath. Denies dysuria.  In the ED, labs revealed mild hypokalemia, but otherwise unremarkable lactic acid, BNP, lipase, CBC. CXR without acute abnormality. Patient was given x1 IV lasix 80 mg and called for admission.  Review Of Systems: Per HPI.  Pertinent Past Medical History: Stage IV Adenocarcinoma of R lung Bipolar 1 Depression COPD T2DM HTN Remainder reviewed in history tab.   Pertinent Past Surgical History: CABG - 2023  Remainder reviewed in history tab.   Pertinent Social History: Tobacco use: Yes - 1/2 ppd for 44 years Alcohol use: none  Other Substance use: none Lives with self  Pertinent Family History: Mother - COPD, heart disease, cancer Father - HLD, heart disease Remainder reviewed in history tab.   Important Outpatient Medications: buspar 7.5 mg BID  duloxetine 30 mg daily ativan 0.5 mg q8h PRN pantoprazole 40 mg BID coreg 12.5 mg  BID spironolactone 50 mg at bedtime Torsemide 20 mg daily Atorvastatin 40 mg daily Atrovent inhaler 2 puffs q4 PRN for wheezing Lactulose PRN Fentanyl patch q3 days Remainder reviewed in medication history.   Objective: BP 110/72 (BP Location: Right Arm)   Pulse 77   Temp 97.6 F (36.4 C) (Oral)   Resp 18   Ht 5\' 7"  (1.702 m)   Wt 92.2 kg   LMP 06/12/2016 (Approximate)   SpO2 100%   BMI 31.84 kg/m  Exam: General: tired-appearing, no acute distress. HEENT: normocephalic, PERRLA, EOM grossly intact, MMM. Cardio: Regular rate, regular rhythm, no murmurs on exam. Pulm: Clear, no wheezing, no crackles. No increased work of breathing on room air. Abdominal: bowel sounds present, soft, mildly tender diffusely, non-distended. Extremities: 2+ pitting edema to bilateral LEs. Moves all extremities equally. Neuro: Alert and oriented x3, speech normal in content, no facial asymmetry. Psych:  Cognition and judgment appear intact. Alert, communicative, and cooperative with normal attention span and concentration.   Labs:  CBC BMET  Recent Labs  Lab 04/08/23 1822 04/08/23 1842  WBC 4.5  --   HGB 8.5* 8.5*  HCT 26.4* 25.0*  PLT 284  --    Recent Labs  Lab 04/08/23 1822 04/08/23 1842 04/09/23 0034  NA 136 137  --   K 3.4* 3.4*  --   CL 106 104  --   CO2 21*  --   --   BUN 17 18  --   CREATININE 1.21* 1.20* 1.09*  GLUCOSE 129* 125*  --   CALCIUM 7.8*  --   --      Lipase 29 Lactic acid 1.7 > 1.2 BNP 78.7  EKG: Normal sinus rhythm. P waves present, normal QRS interval, no evidence of Qtc prolongation. No evidence of ST elevation.  3/28 CXR: IMPRESSION: No active disease.   Cyndia Skeeters, DO 04/09/2023, 1:44 AM PGY-1, [facility] Family Medicine  FPTS Intern pager: [phone], text pages welcome Secure chat group [facility] Teaching Service

## 2023-04-09 NOTE — Assessment & Plan Note (Addendum)
 H/o stage IV non-small cell lung cancer with frequent accumulation of ascites requiring paracentesis. Seen in [facility] clinic 3/18 with anasarca no longer responding to lasix; offered admission at that time but pt declined, and presents now to ED for same. S/p 80 mg IV lasix in the ED. Interested in Palliative discussion but unsure if she is ready for Hospice at this time; she has previously felt pressured to choose Hospice and does not want that to happen again. - Admit to FMTS, attending Dr. Jennette Kettle - Med tele, Vital signs per floor - AM CBC, CMP  - pain regimen: oxycodone 15-30 mg q4 PRN moderate pain, fentanyl patch q3 days - continue spironolactone 50 mg daily - lasix IV 40 mg BID - Palliative consult

## 2023-04-09 NOTE — Assessment & Plan Note (Addendum)
 Bipolar 1, PTSD: continue home buspar 7.5 mg BID, duloxetine 30 mg daily, ativan 0.5 mg q8h PRN GERD: pantoprazole 40 mg BID HTN: continue home coreg 12.5 mg BID, spironolactone 50 mg at bedtime T2DM: mSSI

## 2023-04-09 NOTE — Assessment & Plan Note (Addendum)
 Pt has pleurx drain placed by IR for refractory ascites. Has been having difficulty draining it lately due to incorrect supplies, possible clog. - IR consult for drain management

## 2023-04-09 NOTE — Plan of Care (Incomplete)
 FMTS Brief Progress Note  S:***   O: BP 118/61 (BP Location: Right Arm)   Pulse 88   Temp 98 F (36.7 C) (Oral)   Resp 19   Ht 5\' 7"  (1.702 m)   Wt 92 kg   LMP 06/12/2016 (Approximate)   SpO2 94%   BMI 31.77 kg/m     A/P: Chronic back pain Home medications include oxycodone and 15-30 mg q4h prn and Fentanyl patch 25 mcg every 72 hours which have been ordered.  Patient currently refusing fentanyl patch and requesting IV pain medications. - Orders reviewed. Labs for AM {ordered not order:23822}, which was adjusted as needed.  - If condition changes, plan includes ***.   Erick Alley, DO 04/09/2023, 10:27 PM PGY-3, [facility] Night Resident  Please page [phone] with questions.

## 2023-04-09 NOTE — Procedures (Signed)
 Patient presented to the [facility] [place] department for pleurx drain evaluation and possible paracentesis. Using a catheter access kit the PleurX was accessed and the catheter was aspirated with successful return of fluid. The catheter was then attached to the proper tubing and drained via a negative pressure container. 2 L chylous fluid removed.   The ordering provider was made aware that the PleurX is functioning as expected and going forward the floor nurse will need to order though PleurX supplies from central supply. The patient will also need to be set up with the appropriate home health services at discharge to manage her PleurX supplies.  Alwyn Ren, AGACNP-BC 04/09/2023, 12:52 PM

## 2023-04-09 NOTE — Plan of Care (Signed)
 FMTS Interim Progress Note  S: Went to evaluate patient at bedside.  She reports she is now urinating with IV Lasix.  Still having discomfort in her lower extremities due to edema.  Additionally, she reports that her Pleurx catheter is not clogged-she reports issues that she does not have supplies at home to properly drain.  Pending IR evaluation.  O: BP 118/75 (BP Location: Right Arm)   Pulse 85   Temp 97.8 F (36.6 C) (Oral)   Resp 13   Ht 5\' 7"  (1.702 m)   Wt 92 kg   LMP 06/12/2016 (Approximate)   SpO2 94%   BMI 31.77 kg/m    General: NAD, chronically ill-appearing, resting comfortably in bed eating breakfast Cardio: RRR, no murmurs Respiratory: CTAB, normal work of breathing on room air Extremities: 2+ pitting edema bilateral lower extremities, moving all 4 extremities  A/P: Anasarca  Ascites Plan per H&P.  With the following adjustments. - Ultrasound paracentesis today - IR will evaluate drain on Monday   Tiffany Kocher, DO 04/09/2023, 9:49 AM PGY-2, [facility] Family Medicine Service pager [phone]

## 2023-04-09 NOTE — Plan of Care (Signed)
  Problem: Clinical Measurements: Goal: Ability to maintain clinical measurements within normal limits will improve Outcome: Progressing Goal: Will remain free from infection Outcome: Progressing Goal: Respiratory complications will improve Outcome: Progressing Goal: Cardiovascular complication will be avoided Outcome: Progressing   Problem: Elimination: Goal: Will not experience complications related to urinary retention Outcome: Progressing   Problem: Activity: Goal: Risk for activity intolerance will decrease Outcome: Not Progressing   Problem: Pain Managment: Goal: General experience of comfort will improve and/or be controlled Outcome: Not Progressing

## 2023-04-09 NOTE — Plan of Care (Addendum)
 Saw patient at bedside given established rapport in clinic to discuss GOC.  Patient reports that her Pleurx catheter was draining well, she just does not have the right bottles to facilitate drainage.  Shares her experiences since we last met in clinic, reports going to the beach recently and generally appears to have a good quality of life outside hospital.  States that her only limiting factor is that she has 16 steps up to her apartment which is difficult for her to climb.  However, she is able to live in the community and has 4 different people looking in on her including a former nurse and her downstairs neighbor.  She is aware that palliative care was considering inpatient hospice for her when she presented to the hospital this admission.  However, she states that she felt somewhat pushed into this option and she would like to discharge back home into the community.  She enjoys gardening and would like to return to the outpatient setting continue managing her malignant ascites with Pleurx catheter if she can get the right DME and home health nursing support.  Would likely be safe to discharge home if cleared home health support can be established and DME can be provided.  Discussed goals of care and patient would not like to be comfort care at this time, would prefer DNR limited given that she would still like medical interventions and agrees of her Lasix diuresis to prepare her for discharge home.  Does not want escalation of care to ICU or resuscitation in the emergency, however does agree with continued medical care and does not want to move to hospice at this time.  Changed Code Status to DNR limited. Rounding team to again verify that this is in accordance with wishes.

## 2023-04-09 NOTE — Progress Notes (Signed)
  Echocardiogram 2D Echocardiogram has been performed.  Leda Roys RDCS 04/09/2023, 11:41 AM

## 2023-04-09 NOTE — Assessment & Plan Note (Addendum)
 K 3.4 on presentation. - AM CMP to trend K

## 2023-04-10 DIAGNOSIS — Z515 Encounter for palliative care: Secondary | ICD-10-CM

## 2023-04-10 DIAGNOSIS — C3491 Malignant neoplasm of unspecified part of right bronchus or lung: Secondary | ICD-10-CM | POA: Diagnosis not present

## 2023-04-10 DIAGNOSIS — R188 Other ascites: Secondary | ICD-10-CM | POA: Diagnosis not present

## 2023-04-10 DIAGNOSIS — G893 Neoplasm related pain (acute) (chronic): Secondary | ICD-10-CM

## 2023-04-10 DIAGNOSIS — G8929 Other chronic pain: Secondary | ICD-10-CM | POA: Insufficient documentation

## 2023-04-10 DIAGNOSIS — R601 Generalized edema: Secondary | ICD-10-CM | POA: Diagnosis not present

## 2023-04-10 LAB — CBC
HCT: 25.7 % — ABNORMAL LOW (ref 36.0–46.0)
Hemoglobin: 8.8 g/dL — ABNORMAL LOW (ref 12.0–15.0)
MCH: 31.7 pg (ref 26.0–34.0)
MCHC: 34.2 g/dL (ref 30.0–36.0)
MCV: 92.4 fL (ref 80.0–100.0)
Platelets: 292 10*3/uL (ref 150–400)
RBC: 2.78 MIL/uL — ABNORMAL LOW (ref 3.87–5.11)
RDW: 18 % — ABNORMAL HIGH (ref 11.5–15.5)
WBC: 4.5 10*3/uL (ref 4.0–10.5)
nRBC: 0 % (ref 0.0–0.2)

## 2023-04-10 LAB — GLUCOSE, CAPILLARY
Glucose-Capillary: 71 mg/dL (ref 70–99)
Glucose-Capillary: 91 mg/dL (ref 70–99)

## 2023-04-10 LAB — COMPREHENSIVE METABOLIC PANEL WITH GFR
ALT: 8 U/L (ref 0–44)
AST: 14 U/L — ABNORMAL LOW (ref 15–41)
Albumin: 1.8 g/dL — ABNORMAL LOW (ref 3.5–5.0)
Alkaline Phosphatase: 50 U/L (ref 38–126)
Anion gap: 8 (ref 5–15)
BUN: 15 mg/dL (ref 6–20)
CO2: 28 mmol/L (ref 22–32)
Calcium: 7.9 mg/dL — ABNORMAL LOW (ref 8.9–10.3)
Chloride: 101 mmol/L (ref 98–111)
Creatinine, Ser: 0.98 mg/dL (ref 0.44–1.00)
GFR, Estimated: >60 mL/min (ref >60)
Glucose, Bld: 69 mg/dL — ABNORMAL LOW (ref 70–99)
Potassium: 3.4 mmol/L — ABNORMAL LOW (ref 3.5–5.1)
Sodium: 137 mmol/L (ref 135–145)
Total Bilirubin: 0.4 mg/dL (ref 0.0–1.2)
Total Protein: 4.3 g/dL — ABNORMAL LOW (ref 6.5–8.1)

## 2023-04-10 MED ORDER — POTASSIUM CHLORIDE CRYS ER 20 MEQ PO TBCR
40.0000 meq | EXTENDED_RELEASE_TABLET | Freq: Once | ORAL | Status: DC
  Filled 2023-04-10: qty 2

## 2023-04-10 MED ORDER — HEPARIN SOD (PORK) LOCK FLUSH 100 UNIT/ML IV SOLN
500.0000 [IU] | INTRAVENOUS | Status: AC | PRN
  Administered 2023-04-10: 500 [IU]

## 2023-04-10 MED ORDER — INSULIN GLARGINE-YFGN 100 UNIT/ML ~~LOC~~ SOLN
15.0000 [IU] | Freq: Every day | SUBCUTANEOUS | Status: DC
  Filled 2023-04-10: qty 0.15

## 2023-04-10 MED ORDER — TORSEMIDE 40 MG PO TABS: 100.0000 mg | ORAL_TABLET | Freq: Every day | ORAL | 0 refills | Status: DC

## 2023-04-10 MED ORDER — POTASSIUM CHLORIDE CRYS ER 20 MEQ PO TBCR: 40.0000 meq | EXTENDED_RELEASE_TABLET | Freq: Once | ORAL | Status: DC

## 2023-04-10 MED ORDER — POTASSIUM CHLORIDE 20 MEQ PO PACK
40.0000 meq | PACK | Freq: Once | ORAL | Status: AC
  Administered 2023-04-10: 40 meq via ORAL
  Filled 2023-04-10: qty 2

## 2023-04-10 NOTE — TOC Transition Note (Signed)
 Transition of Care [facility]) - Discharge Note   Patient Details  Name: Cassidy [name] MRN: [id number] Date of Birth: 05-Mar-1965  Transition of Care [facility]) CM/SW Contact:  Lawerance Sabal, RN Phone Number: 04/10/2023, 12:54 PM   Clinical Narrative:     [name] w this lovely patient at bedside. She explained that she is able to change/ drain of PleurX catheters on her own, but ran out of drains. She states that she drains twice a week. She states that there was a problem from the office/ or discharge from the hospital initially where the Rx for a continuous supply of drains was not processed correctly and she ran out of her supply. I discussed with her and her medical team that due to her insurance I would not be able to get her a HHRN, patient is able to change drain. I am able to have unit secretary order up a box of 10 drains for her. This should last 5 weeks. I can see in notes from the office that they are working on Dx and signatures to get Rx completed correctly. I have instructed the patient to follow up with the office on Monday to let them know she has been in the hospital and has a 5 week supply of drains but the Rx for refills will need completed. Patient appreciative and agreeable to plan.  Final next level of care: Home/Self Care Barriers to Discharge: No Barriers Identified   Patient Goals and CMS Choice Patient states their goals for this hospitalization and ongoing recovery are:: to go home          Discharge Placement                       Discharge Plan and Services Additional resources added to the After Visit Summary for                  DME Arranged:  (PleurX)                    Social Drivers of Health (SDOH) Interventions SDOH Screenings   Food Insecurity: No Food Insecurity (04/08/2023)  Housing: Low Risk  (04/08/2023)  Transportation Needs: No Transportation Needs (04/08/2023)  Utilities: Not At Risk (04/08/2023)  Depression (PHQ2-9): Low Risk   (03/29/2023)  Recent Concern: Depression (PHQ2-9) - Medium Risk (01/14/2023)  Financial Resource Strain: High Risk (09/08/2021)  Tobacco Use: Medium Risk (04/08/2023)     Readmission Risk Interventions    09/30/2022   12:37 PM  Readmission Risk Prevention Plan  Transportation Screening Complete  PCP or Specialist Appt within 3-5 Days Complete  HRI or Home Care Consult Complete  Social Work Consult for Recovery Care Planning/Counseling Complete  Palliative Care Screening Not Applicable  Medication Review Oceanographer) Complete

## 2023-04-10 NOTE — Consult Note (Signed)
 Consultation Note Date: 04/10/2023   Patient Name: Cassidy [name]  DOB: [birth year]  MRN: [id number]  Age / Sex: 58 y.o., female  PCP: Cassidy Martinez, MD Referring Physician: McDiarmid, Leighton Roach, MD  Reason for Consultation: Establishing goals of care  HPI/Patient Profile: 58 y.o. female  admitted on 04/08/2023 with past medical history significant for adenocarcinoma of the lungs, CAD (s/p percutaneous coronary angioplasty), depression, fibromyalgia, GERD. She presented with worsening ascites/anasarca, and inability to fully drain her Pleurx catheter at home due to DME supply issue.   Stage IV non-small cell lung cancer, adenocarcinoma diagnosed in August 2023. 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 Additionally had 19 cycles of systemic chemotherapy-discontinue treatment in September 2024 due to side effects and quality of life.   Patient faces ongoing decisions regarding treatment options, advanced directive  and anticipatory care needs.   Clinical Assessment and Goals of Care:  This NP Cassidy [name] reviewed medical records, received report from team, assessed the patient and then meet at the patient's bedside  to discuss diagnosis, prognosis, GOC, EOL wishes disposition and options.   Concept of Palliative Care was introduced as specialized medical care for people and their families living with serious illness.  If focuses on providing relief from the symptoms and stress of a serious illness.  The goal is to improve quality of life for both the patient and the family.  Values and goals of care important to patient and family were attempted to be elicited.  Patient is familiar with palliative medicine as she sees nurse practitioner in outpatient cancer  Created space and opportunity for patient  to explore thoughts and feelings regarding current medical  situation.  Patient shared the many challenges encountered throughout her life, and   how through  strength and perseverance she has overcome, raising  her son and feeling very  successful.  She reports a strong social network to include her son, her brother, many neighbors and her best girlfriend.   A  discussion was had today regarding advanced directives.  Concepts specific to code status, artifical feeding and hydration, continued IV antibiotics and rehospitalization was had.    The difference between a aggressive medical intervention path  and a palliative comfort care path for this patient at this time was had.     Education offered on hospice benefit; philosophy and eligibility  Patient verbalizes an understanding of the seriousness of her current disease state.  She recognizes her continued physical and functional decline and her increase in personal care needs.  She is hoping her son and brother are able to accept her terminal situation more realistically.  Cassidy [name] at this time wishes to continue with her current treatment plan, she is hoping for more time with her family and  to carry out some more  of her bucket list wishes.      Happily and peacefully she shares her most recent trip to the beach with her family as one of those bucket list items.  Questions and concerns addressed.  Patient  encouraged to call with questions or concerns.     PMT will continue to support holistically.          No documented H POA or living will.  Patient tells me that in the event she cannot speak for herself she would like her son Cassidy Duggan -Fredric [name] to be her primary decision maker including patient's brother/Cassidy [name] for support and assistance in decision-making.       SUMMARY OF RECOMMENDATIONS    Code Status/Advance Care Planning: DNR-limited   Symptom Management:  Continue medications/recently adjusted/ prescribed by Cassidy Abraham NP at [facility], patient is comfortable on current medications bruising.     - Fentanyl patch 25 mcg     - Oxycodone 15 mg immediate release every 4 hours as needed  Palliative Prophylaxis:  Aspiration, Bowel Regimen, and Frequent Pain Assessment  Additional Recommendations (Limitations, Scope, Preferences): Full Scope Treatment  Psycho-social/Spiritual:  Desire for further Chaplaincy support:no Additional Recommendations: Education on Hospice  Prognosis:  Unable to determine  Discharge Planning:  Recommend outpatient palliative services in the community Home with Home Health      Primary Diagnoses: Present on Admission:  Anasarca  Essential hypertension  Tobacco abuse  PTSD (post-traumatic stress disorder)  Hypokalemia  Ascites  Adenocarcinoma of right lung, stage 4 (HCC)   I have reviewed the medical record, interviewed the patient and family, and examined the patient. The following aspects are pertinent.  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  s/p PCTA to dRCA and ostial RPAV-PLA vessel + PDA branch (05/2012)  b. [place] s/p DES to mLAD - Resolute DES 3.0 x 22 (3.10mm -->3.3 mm)   Bipolar depression (HCC)    Breast abscess    a. right side.    COPD (chronic obstructive pulmonary disease) (HCC)    Depression    Diabetic peripheral neuropathy (HCC)    Fibromyalgia    Genital warts    GERD (gastroesophageal reflux disease)    History of hiatal hernia    HLD (hyperlipidemia)    Hypertension    Lung cancer (HCC)    Pain in limb    a. LE VENOUS DUPLEX, 02/05/2009 - no evidence of deep vein thrombosis, Baker's cyst   Pneumonia    PTSD (post-traumatic stress disorder)    Sickle cell trait (HCC)    Tobacco abuse    Tooth caries    Type II diabetes mellitus (HCC)    Social  History   Socioeconomic History   Marital status: Single    Spouse name: Not on file   Number of children: 1   Years of education: Not on file   Highest education level: Not on file  Occupational History   Occupation: homemaker   Occupation: disabled  Tobacco Use   Smoking status: Former    Current packs/day: 0.50    Average packs/day: 0.5 packs/day for 44.2 years (22.1 ttl pk-yrs)    Types: Cigarettes    Start date: 01/12/1979   Smokeless tobacco: Never   Tobacco comments:    PT STATES THAT SHE CHEWS THE GUM FOR SMOKING AND NO LONGER SMOKES  Vaping Use   Vaping status: Never Used  Substance and Sexual Activity   Alcohol use:  No    Alcohol/week: 0.0 standard drinks of alcohol    Comment: recovering addict; 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   Social Drivers of Corporate investment banker Strain: High Risk (09/08/2021)   Overall Financial Resource Strain (CARDIA)    Difficulty of Paying Living Expenses: Very hard  Food Insecurity: No Food Insecurity (04/08/2023)   Hunger Vital Sign    Worried About Running Out of Food in the Last Year: Never true    Ran Out of Food in the Last Year: Never true  Transportation Needs: No Transportation Needs (04/08/2023)   PRAPARE - Administrator, Civil Service (Medical): No    Lack of Transportation (Non-Medical): No  Physical Activity: Not on file  Stress: Not on file  Social Connections: Not on file   Family History  Problem Relation Age of Onset   Esophageal cancer Mother     COPD Mother    Cancer Mother        lymphoma   Heart disease Mother    Stomach cancer Mother    Hyperlipidemia Father    Heart disease Father    Cancer Maternal Aunt        breast, colon   Crohn's disease Maternal Aunt    Hypertension Maternal Grandmother    Diabetes Maternal Grandmother    Hypertension Brother    Rectal cancer Neg Hx    Liver cancer Neg Hx    Colon cancer Neg Hx    Breast cancer Neg Hx    Scheduled Meds:  busPIRone  7.5 mg Oral BID   carvedilol  12.5 mg Oral BID WC   Chlorhexidine Gluconate Cloth  6 each Topical Daily   DULoxetine  30 mg Oral Daily   enoxaparin (LOVENOX) injection  40 mg Subcutaneous Q24H   feeding supplement  237 mL Oral BID BM   fentaNYL  1 patch Transdermal Q72H   ferrous sulfate  325 mg Oral Q breakfast   folic acid  1 mg Oral Daily   furosemide  40 mg Intravenous BID   insulin aspart  0-15 Units Subcutaneous TID WC   insulin glargine-yfgn  15 Units Subcutaneous QHS   pantoprazole  40 mg Oral BID   potassium chloride  40 mEq Oral Once   sodium chloride flush  10-40 mL Intracatheter Q12H   spironolactone  50 mg Oral QHS   Continuous Infusions: PRN Meds:.LORazepam, oxyCODONE, polyethylene glycol, sodium chloride flush Medications Prior to Admission:  Prior to Admission medications   Medication Sig Start Date End Date Taking? Authorizing Provider  aspirin EC 81 MG tablet Take 81 mg by mouth daily.    [provider]  atorvastatin (LIPITOR) 40 MG tablet TAKE 1 TABLET (40 MG TOTAL) BY MOUTH DAILY. 04/23/22   [name], DO  busPIRone (BUSPAR) 15 MG tablet Take 0.5 tablets (7.5 mg total) by mouth 2 (two) times daily. 05/20/22   [name], DO  Carboxymethylcellul-Glycerin (LUBRICATING EYE DROPS OP) Place 1 drop into both eyes daily as  needed (dry eyes).    [provider]  carvedilol (COREG) 6.25 MG tablet Take 2 tablets (12.5 mg total) by mouth 2 (two) times daily with a meal. 03/29/23   Cassidy Martinez, MD   diclofenac Sodium (VOLTAREN) 1 % GEL Apply 1 application topically 3 times daily as needed for pain 12/28/22   [name], MD  docusate sodium (COLACE) 100 MG capsule Take 1 capsule (100 mg total) by mouth daily. 11/22/22   [name], MD  DULoxetine (CYMBALTA) 30 MG capsule Take 1 capsule (30 mg total) by mouth daily. 06/23/22   Antony Madura, MD  fentaNYL (DURAGESIC) 25 MCG/HR Place 1 patch onto the skin every 3 (three) days. 04/06/23   Pickenpack-Cousar, Arty Baumgartner, NP  ferrous sulfate 325 (65 FE) MG tablet Take 1 tablet (325 mg total) by mouth daily with breakfast. 11/22/22   [name], MD  fluticasone (FLONASE) 50 MCG/ACT nasal spray PLACE 1 SPRAY INTO BOTH NOSTRILS DAILY AS NEEDED (CONGESTION). 02/23/23   Evette Georges, MD  folic acid (FOLVITE) 1 MG tablet TAKE 1 TABLET (1 MG TOTAL) BY MOUTH DAILY. 12/20/22   Si Gaul, MD  glucose blood (ACCU-CHEK AVIVA PLUS) test strip USE AS INSTRUCTED EVERY DAY 03/23/23   Westley Chandler, MD  hyoscyamine (LEVSIN SL) 0.125 MG SL tablet Place 1 tablet (0.125 mg total) under the tongue every 6 (six) hours as needed. For abdominal cramping 03/10/22   Heilingoetter, Cassandra L, PA-C  insulin glargine (LANTUS) 100 UNIT/ML Solostar Pen Inject 24 Units into the skin every morning. 05/03/22   [name], DO  Insulin Pen Needle 32G X 4 MM MISC 1 Needle by Does not apply route in the morning and at bedtime. 02/02/22   [name], DO  ipratropium (ATROVENT HFA) 17 MCG/ACT inhaler Inhale 2 puffs into the lungs every 4 (four) hours as needed for wheezing (COPD).    [provider]  lactulose (CHRONULAC) 10 GM/15ML solution Take 15 mLs (10 g total) by mouth daily as needed for moderate constipation or severe constipation. 01/28/23   Shitarev, Dimitry, MD  lidocaine-prilocaine (EMLA) cream APPLY 1 APPLICATION TOPICALLY AS NEEDED. Patient not taking: Reported on 04/06/2023 02/01/22   Heilingoetter, Cassandra L, PA-C  LORazepam (ATIVAN)  0.5 MG tablet Take 1 tablet (0.5 mg total) by mouth every 8 (eight) hours as needed for anxiety (nausea). 01/19/23   Pickenpack-Cousar, Arty Baumgartner, NP  metoCLOPramide (REGLAN) 10 MG tablet Take 1 tablet (10 mg total) by mouth every 8 (eight) hours as needed for nausea. 10/21/22   Pickenpack-Cousar, Arty Baumgartner, NP  mometasone-formoterol (DULERA) 100-5 MCG/ACT AERO Inhale 2 puffs into the lungs in the morning and at bedtime. 11/22/22   [name], MD  nicotine polacrilex (NICORETTE) 2 MG gum Take 1 each (2 mg total) by mouth as needed for smoking cessation. Patient not taking: Reported on 04/06/2023 06/03/21   [name], DO  nystatin (MYCOSTATIN) 100000 UNIT/ML suspension Take 5 mLs (500,000 Units total) by mouth 4 (four) times daily. 03/31/23   Pickenpack-Cousar, Arty Baumgartner, NP  ondansetron (ZOFRAN-ODT) 8 MG disintegrating tablet Take 1 tablet (8 mg total) by mouth every 8 (eight) hours as needed for nausea or vomiting. 01/28/22   Heilingoetter, Cassandra L, PA-C  oxyCODONE (ROXICODONE) 15 MG immediate release tablet Take 1-2 tablets (15-30 mg total) by mouth every 4 (four) hours as needed for pain. 03/16/23   Pickenpack-Cousar, Arty Baumgartner, NP  pantoprazole (PROTONIX) 40 MG tablet Take 1 tablet (40 mg total) by mouth 2 (two)  times daily. 06/23/22   Zehr, Shanda Bumps D, PA-C  polyethylene glycol powder (GAVILAX) 17 GM/SCOOP powder Take 17 g by mouth daily. 11/22/22   [name], MD  prochlorperazine (COMPAZINE) 10 MG tablet TAKE 1 TABLET (10 MG TOTAL) BY MOUTH EVERY 6 (SIX) HOURS AS NEEDED FOR NAUSEA OR VOMITING. Patient not taking: Reported on 04/06/2023 05/12/22   Heilingoetter, Cassandra L, PA-C  spironolactone (ALDACTONE) 50 MG tablet Take 1 tablet (50 mg total) by mouth at bedtime. 01/14/23   Shelby Mattocks, DO  torsemide (DEMADEX) 20 MG tablet Take 1 tablet (20 mg total) by mouth daily. 03/29/23   Cassidy Martinez, MD  valACYclovir (VALTREX) 1000 MG tablet Take for 3 days prn each outbreak. Patient taking  differently: Take 1,000 mg by mouth daily. Take for 3 days prn each outbreak. 07/08/22   Brock Bad, MD   Allergies  Allergen Reactions   Amoxil [Amoxicillin] Hives   Chantix [Varenicline] Other (See Comments)    Nightmares Hallucinations     Suboxone [Buprenorphine Hcl-Naloxone Hcl] Nausea And Vomiting   Emend [Fosaprepitant Dimeglumine] Itching    See notes for transfusion on 8/23. Pt reported new onset of itching after Emend infusion was started. 25mg  Benadryl IV given per MD. Pt tolerated remaining infusion well.    Review of Systems  Cardiovascular:  Positive for leg swelling.  Gastrointestinal:  Positive for abdominal distention.  Neurological:  Positive for weakness.    Physical Exam Cardiovascular:     Rate and Rhythm: Normal rate.  Pulmonary:     Effort: Pulmonary effort is normal.  Musculoskeletal:     Comments: Generalized weakness and muscle atrophy  Skin:    General: Skin is warm and dry.  Neurological:     Mental Status: She is alert and oriented to person, place, and time.     Vital Signs: BP 96/60 (BP Location: Right Arm)   Pulse 69   Temp 98 F (36.7 C) (Oral)   Resp 16   Ht 5\' 7"  (1.702 m)   Wt 86.9 kg   LMP 06/12/2016 (Approximate)   SpO2 100%   BMI 30.01 kg/m  Pain Scale: 0-10 POSS *See Group Information*: 1-Acceptable,Awake and alert Pain Score: 2  (repositioned on bed)   SpO2: SpO2: 100 % O2 Device:SpO2: 100 % O2 Flow Rate: .   IO: Intake/output summary:  Intake/Output Summary (Last 24 hours) at 04/10/2023 0912 Last data filed at 04/10/2023 0501 Gross per 24 hour  Intake 240 ml  Output 2600 ml  Net -2360 ml    LBM: Last BM Date : 04/09/23 Baseline Weight: Weight: 86.2 kg Most recent weight: Weight: 86.9 kg     Palliative Assessment/Data:  40 %    Discussed with attending team/Dr. Mliss Sax  Time 75 minutes Signed by: Cassidy Creed, NP   Please contact Palliative Medicine Team phone at [phone] for questions and concerns.   For individual provider: See Loretha Stapler

## 2023-04-10 NOTE — Discharge Summary (Addendum)
 [facility] Discharge Summary  Patient name: Cassidy [name] Medical record number: [id number] Date of birth: 09/19/1965 Age: 58 y.o. Gender: female Date of Admission: 04/08/2023  Date of Discharge: 04/10/23 Admitting Physician: Rometta Emery, MD  Primary Care Provider: Alfredo Martinez, MD Consultants: IR  Indication for Hospitalization: LE swelling  Brief Hospital Course:  Cassidy [name] is a 58 year old female with past medical history significant for adenocarcinoma of the lungs, CAD (s/p percutaneous coronary angioplasty), depression, fibromyalgia, GERD.  She presented with worsening ascites/anasarca, and inability to fully drain her Pleurx catheter at home due to DME supply issue.  Ascites  anasarca Stable renal function, baseline Cr 1.0-1.2. No signs of liver failure. Echocardiograms LVEF 65-70%, no signs of heart failure.  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 Most likely etiology is her stage IV lung cancer. Pleurex catheter is functional (however patient ran out appropriate supplies at home). IR paracentesis removed 2L, and patient's leg edema improved with IV lasix.  Transitioned to oral diuresis prior to discharge, torsemide 40 mg daily.   Adenocarcinoma right lung, stage IV Stage IV non-small cell lung cancer, adenocarcinoma diagnosed in August 2023.  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  Additionally had 19 cycles of systemic chemotherapy-discontinue treatment in September 2024 due to side effects and quality of life.  She has been following with palliative medicine outpatient since.  She was given a Pleurx catheter for palliative measures regarding ascites (see above).  Patient is aware of her poor long-term prognosis.  Palliative care video visit on 04/06/2023 encouraged patient to seek care at [facility] with regard to worsening ascites.  Goals of  care discussion during hospitalization, patient is not ready to transition to Hospice at this time.  Other chronic conditions were medically managed with home medications and formulary alternatives as necessary (bipolar 1, PTSD, GERD, HTN, T2DM).  Follow-up recommendations: 1) increase torsemide to 40 mg daily on discharge; consider titration as necessary. 2) Ensure proper Pleurx drain supplies with HH.   Discharge Diagnoses/Problem List:  Principal Problem:   Anasarca Active Problems:   Adenocarcinoma of right lung, stage 4 (HCC)   Essential hypertension   Tobacco abuse   PTSD (post-traumatic stress disorder)   Hypokalemia   Ascites   Chronic health problem   Chronic back pain   Disposition: Home  Discharge Condition: Stable  Discharge Exam:  General: Sitting up in bed, conversant and pleasant, NAD Respiratory: Normal work of breathing on room air Extremities: Significant improvement in LE pitting edema from prior exam.   Significant Procedures:  3/29 ultrasound-guided paracentesis with successful drainage of ascites via Pleurx catheter, yielding 2 L of peritoneal fluid.  Significant Labs and Imaging:  Recent Labs  Lab 04/08/23 1822 04/08/23 1842 04/09/23 0211 04/10/23 0515  WBC 4.5  --  5.4 4.5  HGB 8.5* 8.5* 9.9* 8.8*  HCT 26.4* 25.0* 29.3* 25.7*  PLT 284  --  306 292   Recent Labs  Lab 04/08/23 1822 04/08/23 1842 04/09/23 0034 04/09/23 0211 04/09/23 1449 04/10/23 0515  NA 136 137  --  137 138 137  K 3.4* 3.4*  --  3.0* 3.1* 3.4*  CL 106 104  --  104 104 101  CO2 21*  --   --  24 27 28   GLUCOSE 129* 125*  --  105* 124* 69*  BUN 17 18  --  16 16 15   CREATININE 1.21*  1.20* 1.09* 1.11* 1.07* 0.98  CALCIUM 7.8*  --   --  7.9* 7.7* 7.9*  ALKPHOS 64  --   --  66 64 50  AST 19  --   --  18 16 14*  ALT 10  --   --  11 9 8   ALBUMIN 1.9*  --   --  1.9* 1.7* 1.8*    3/28 CXR: IMPRESSION: No active disease.  Results/Tests Pending at Time of Discharge:  none  Discharge Medications:  Allergies as of 04/10/2023       Reactions   Amoxil [amoxicillin] Hives   Chantix [varenicline] Other (See Comments)   Nightmares Hallucinations     Suboxone [buprenorphine Hcl-naloxone Hcl] Nausea And Vomiting   Emend [fosaprepitant Dimeglumine] Itching   See notes for transfusion on 8/23. Pt reported new onset of itching after Emend infusion was started. 25mg  Benadryl IV given per MD. Pt tolerated remaining infusion well.         Medication List     STOP taking these medications    nicotine polacrilex 2 MG gum Commonly known as: NICORETTE   prochlorperazine 10 MG tablet Commonly known as: COMPAZINE       TAKE these medications    Accu-Chek Aviva Plus test strip Generic drug: glucose blood USE AS INSTRUCTED EVERY DAY   aspirin EC 81 MG tablet Take 81 mg by mouth daily.   atorvastatin 40 MG tablet Commonly known as: LIPITOR TAKE 1 TABLET (40 MG TOTAL) BY MOUTH DAILY.   busPIRone 15 MG tablet Commonly known as: BUSPAR Take 0.5 tablets (7.5 mg total) by mouth 2 (two) times daily.   carvedilol 6.25 MG tablet Commonly known as: COREG Take 2 tablets (12.5 mg total) by mouth 2 (two) times daily with a meal.   diclofenac Sodium 1 % Gel Commonly known as: VOLTAREN Apply 1 application topically 3 times daily as needed for pain   docusate sodium 100 MG capsule Commonly known as: COLACE Take 1 capsule (100 mg total) by mouth daily.   Dulera 100-5 MCG/ACT Aero Generic drug: mometasone-formoterol Inhale 2 puffs into the lungs in the morning and at bedtime.   DULoxetine 30 MG capsule Commonly known as: Cymbalta Take 1 capsule (30 mg total) by mouth daily.   fentaNYL 25 MCG/HR Commonly known as: DURAGESIC Place 1 patch onto the skin every 3 (three) days.   ferrous sulfate 325 (65 FE) MG tablet Take 1 tablet (325 mg total) by mouth daily with breakfast.   fluticasone 50 MCG/ACT nasal spray Commonly known as: FLONASE PLACE 1  SPRAY INTO BOTH NOSTRILS DAILY AS NEEDED (CONGESTION).   folic acid 1 MG tablet Commonly known as: FOLVITE TAKE 1 TABLET (1 MG TOTAL) BY MOUTH DAILY.   hyoscyamine 0.125 MG SL tablet Commonly known as: LEVSIN SL Place 1 tablet (0.125 mg total) under the tongue every 6 (six) hours as needed. For abdominal cramping   insulin glargine 100 UNIT/ML Solostar Pen Commonly known as: LANTUS Inject 24 Units into the skin every morning.   Insulin Pen Needle 32G X 4 MM Misc 1 Needle by Does not apply route in the morning and at bedtime.   ipratropium 17 MCG/ACT inhaler Commonly known as: ATROVENT HFA Inhale 2 puffs into the lungs every 4 (four) hours as needed for wheezing (COPD).   lactulose 10 GM/15ML solution Commonly known as: CHRONULAC Take 15 mLs (10 g total) by mouth daily as needed for moderate constipation or severe constipation.   lidocaine-prilocaine cream Commonly  known as: EMLA APPLY 1 APPLICATION TOPICALLY AS NEEDED.   LORazepam 0.5 MG tablet Commonly known as: ATIVAN Take 1 tablet (0.5 mg total) by mouth every 8 (eight) hours as needed for anxiety (nausea).   LUBRICATING EYE DROPS OP Place 1 drop into both eyes daily as needed (dry eyes).   metoCLOPramide 10 MG tablet Commonly known as: REGLAN Take 1 tablet (10 mg total) by mouth every 8 (eight) hours as needed for nausea.   nystatin 100000 UNIT/ML suspension Commonly known as: MYCOSTATIN Take 5 mLs (500,000 Units total) by mouth 4 (four) times daily.   ondansetron 8 MG disintegrating tablet Commonly known as: ZOFRAN-ODT Take 1 tablet (8 mg total) by mouth every 8 (eight) hours as needed for nausea or vomiting.   oxyCODONE 15 MG immediate release tablet Commonly known as: ROXICODONE Take 1-2 tablets (15-30 mg total) by mouth every 4 (four) hours as needed for pain.   pantoprazole 40 MG tablet Commonly known as: PROTONIX Take 1 tablet (40 mg total) by mouth 2 (two) times daily.   polyethylene glycol powder 17  GM/SCOOP powder Commonly known as: GaviLAX Take 17 g by mouth daily.   spironolactone 50 MG tablet Commonly known as: ALDACTONE Take 1 tablet (50 mg total) by mouth at bedtime.   Torsemide 40 MG Tabs Take 100 mg by mouth daily. What changed:  medication strength how much to take   valACYclovir 1000 MG tablet Commonly known as: VALTREX Take for 3 days prn each outbreak. What changed:  how much to take how to take this when to take this        Discharge Instructions: Please refer to Patient Instructions section of EMR for full details.  Patient was counseled important signs and symptoms that should prompt return to medical care, changes in medications, dietary instructions, activity restrictions, and follow up appointments.   Follow-Up Appointments:  Future Appointments  Date Time Provider Department Center  04/12/2023  3:00 PM Clemons, Mick Sell B [facility] None  04/19/2023 12:00 PM [facility] FLUSH [facility] None  04/19/2023  1:00 PM WL-CT 2 [facility]-CT [place]  04/21/2023  3:00 PM [facility] PALLIATIVE CARE [facility] None  04/26/2023 11:30 AM Jenel Lucks D, LCSW [facility] None  05/04/2023 11:30 AM Si Gaul, MD [facility] None  05/11/2023 11:30 AM Juanell Fairly, RN [facility] None  06/01/2023  1:30 PM [facility] FLUSH [facility] None  06/23/2023  9:00 AM Antony Madura, MD [facility] None  07/20/2023  1:30 PM [facility] FLUSH [facility] None  08/31/2023  1:30 PM [facility] FLUSH [facility] None  10/12/2023  1:30 PM [facility] FLUSH [facility] None  11/23/2023  1:30 PM [facility] FLUSH [facility] None  01/03/2024  1:30 PM [facility] FLUSH [facility] None    Cyndia Skeeters, DO 04/10/2023, 11:57 AM PGY-1, [facility]

## 2023-04-10 NOTE — Assessment & Plan Note (Deleted)
 Bipolar 1, PTSD: continue home buspar 7.5 mg BID, duloxetine 30 mg daily, ativan 0.5 mg q8h PRN GERD: pantoprazole 40 mg BID HTN: continue home coreg 12.5 mg BID, spironolactone 50 mg at bedtime T2DM: mSSI

## 2023-04-10 NOTE — Plan of Care (Signed)
  Problem: Education: Goal: Knowledge of General Education information will improve Description: Including pain rating scale, medication(s)/side effects and non-pharmacologic comfort measures Outcome: Progressing   Problem: Clinical Measurements: Goal: Will remain free from infection Outcome: Progressing Goal: Respiratory complications will improve Outcome: Progressing   Problem: Elimination: Goal: Will not experience complications related to bowel motility Outcome: Progressing Goal: Will not experience complications related to urinary retention Outcome: Progressing   Problem: Safety: Goal: Ability to remain free from injury will improve Outcome: Progressing   Problem: Skin Integrity: Goal: Risk for impaired skin integrity will decrease Outcome: Progressing   Problem: Tissue Perfusion: Goal: Adequacy of tissue perfusion will improve Outcome: Progressing

## 2023-04-10 NOTE — Assessment & Plan Note (Deleted)
 Pleurx drain accessed yesterday by IR and 2L chylous fluid removed.  Pleurx now functioning properly.  - IR plan to evaluate drain tomorrow 3/31 ***

## 2023-04-10 NOTE — Assessment & Plan Note (Deleted)
 Home medications include oxycodone 15-30 mg q4h PRN and Fentanyl patch 25 mcg q72h, which have been reordered as above.***

## 2023-04-10 NOTE — Discharge Instructions (Addendum)
 Dear Cassidy [name],   Thank you for letting us participate in your care! We are glad you are feeling better. While in the hospital you had your Pleurx drain assessed and fluid removed. You also got IV medicine to help you get rid of extra fluid, and your legs swelling did improve! It is very important that you continue to take your medicines at home as prescribed.  We adjusted your home Torsemide. Please start taking 40mg  torsemide daily.  POST-HOSPITAL & CARE INSTRUCTIONS We recommend following up with your PCP within 1 week from being discharged from the hospital. Please let PCP/Specialists know of any changes in medications that were made which you will be able to see in the medications section of this packet. Please also follow up with your other appointments as below:  DOCTOR'S APPOINTMENTS & FOLLOW UP Future Appointments  Date Time Provider Department Center  04/12/2023  3:00 PM Clemons, Mick Sell B [facility] None  04/19/2023 12:00 PM [facility] FLUSH [facility] None  04/19/2023  1:00 PM WL-CT 2 WL-CT [place]  04/21/2023  3:00 PM [facility] PALLIATIVE CARE [facility] None  04/26/2023 11:30 AM Jenel Lucks D, LCSW [facility] None  05/04/2023 11:30 AM Si Gaul, MD [facility] None  05/11/2023 11:30 AM Juanell Fairly, RN [facility] None  06/01/2023  1:30 PM [facility] FLUSH [facility] None  06/23/2023  9:00 AM Antony Madura, MD [facility] None  07/20/2023  1:30 PM [facility] FLUSH [facility] None  08/31/2023  1:30 PM [facility] FLUSH [facility] None  10/12/2023  1:30 PM [facility] FLUSH [facility] None  11/23/2023  1:30 PM [facility] FLUSH [facility] None  01/03/2024  1:30 PM [facility] FLUSH [facility] None     Thank you for choosing [facility]! Take care and be well!  Family Medicine Teaching Service Inpatient Team [place]  [facility]  [address] [place], [place] [address] [phone]

## 2023-04-10 NOTE — Assessment & Plan Note (Deleted)
 K 3.4 this AM. Repleted. - AM BMP to trend K

## 2023-04-10 NOTE — Assessment & Plan Note (Deleted)
 Good UOP with IV Lasix 40 mg, edema is improving***. - AM CBC, BMP  - pain regimen: oxycodone 15-30 mg q4 PRN moderate pain, fentanyl patch q3 days - continuing spironolactone 50 mg daily -Continuing lasix IV 40 mg BID

## 2023-04-10 NOTE — Assessment & Plan Note (Deleted)
 Palliative consult has been placed, and discussion continues on patient's ultimate goals of care.  She did transition from comfort care to DNR-limited during this hospitalization which is more in line with her wishes and goals.

## 2023-04-11 NOTE — Telephone Encounter (Signed)
 Received signed order from Dr. Costella Hatcher printed office notes with addendum. Neal cosigned.   Faxed to [employer].   Veronda Prude, RN

## 2023-04-12 ENCOUNTER — Ambulatory Visit: Payer: Self-pay

## 2023-04-12 NOTE — Patient Instructions (Signed)
 Visit Information  Thank you for taking time to visit with me today. Please don't hesitate to contact me if I can be of assistance to you.   Following are the goals we discussed today:  Patient will contact Housing Authority to address progress of Section 8. Patient will contact [facility] for life alert options.   Our next appointment is by telephone on 04/21/23 at 11am  Please call the care guide team at [phone] if you need to cancel or reschedule your appointment.   If you are experiencing a Mental Health or Behavioral Health Crisis or need someone to talk to, please call 911  Patient verbalizes understanding of instructions and care plan provided today and agrees to view in MyChart. Active MyChart status and patient understanding of how to access instructions and care plan via MyChart confirmed with patient.     Telephone follow up appointment with care management team member scheduled for:04/21/23 at 11am.  Lysle Morales, BSW [place]  [facility], [facility] Social Worker Direct Dial: [phone]  Fax: [phone] Website: [email or web address]
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
## 2023-04-19 ENCOUNTER — Ambulatory Visit (HOSPITAL_COMMUNITY)
Admission: RE | Admit: 2023-04-19 | Discharge: 2023-04-19 | Disposition: A | Discharge status: Home / Self Care | Source: Ambulatory Visit | Attending: Physician Assistant | Admitting: Physician Assistant

## 2023-04-19 ENCOUNTER — Inpatient Hospital Stay: Payer: Medicaid Other | Attending: Internal Medicine

## 2023-04-19 ENCOUNTER — Encounter

## 2023-04-19 ENCOUNTER — Telehealth: Payer: Self-pay

## 2023-04-19 ENCOUNTER — Encounter (HOSPITAL_COMMUNITY): Payer: Self-pay

## 2023-04-19 DIAGNOSIS — R932 Abnormal findings on diagnostic imaging of liver and biliary tract: Secondary | ICD-10-CM | POA: Diagnosis not present

## 2023-04-19 DIAGNOSIS — C3411 Malignant neoplasm of upper lobe, right bronchus or lung: Secondary | ICD-10-CM | POA: Diagnosis not present

## 2023-04-19 DIAGNOSIS — C3491 Malignant neoplasm of unspecified part of right bronchus or lung: Secondary | ICD-10-CM | POA: Insufficient documentation

## 2023-04-19 DIAGNOSIS — R188 Other ascites: Secondary | ICD-10-CM | POA: Diagnosis not present

## 2023-04-19 MED ORDER — HEPARIN SOD (PORK) LOCK FLUSH 100 UNIT/ML IV SOLN
INTRAVENOUS | Status: AC
  Filled 2023-04-19: qty 5

## 2023-04-19 MED ORDER — IOHEXOL 300 MG/ML  SOLN
100.0000 mL | Freq: Once | INTRAMUSCULAR | Status: AC | PRN
  Administered 2023-04-19: 100 mL via INTRAVENOUS

## 2023-04-19 MED ORDER — HEPARIN SOD (PORK) LOCK FLUSH 100 UNIT/ML IV SOLN
500.0000 [IU] | Freq: Once | INTRAVENOUS | Status: AC
  Administered 2023-04-19: 500 [IU] via INTRAVENOUS

## 2023-04-19 MED ORDER — SODIUM CHLORIDE (PF) 0.9 % IJ SOLN
INTRAMUSCULAR | Status: AC
  Filled 2023-04-19: qty 50

## 2023-04-19 NOTE — Telephone Encounter (Signed)
 Patient called and LVM about port flush appt today due to being in the hospital recently.  Informed patient that port flush appt does not need to be rescheduled from today, just keep appt on 06/01/2023.  Patient verbalized understanding.

## 2023-04-21 ENCOUNTER — Inpatient Hospital Stay: Attending: Internal Medicine | Admitting: Nurse Practitioner

## 2023-04-21 ENCOUNTER — Ambulatory Visit: Payer: Self-pay

## 2023-04-21 NOTE — Patient Outreach (Signed)
 Complex Care Management   Visit Note  04/21/2023  Name:  MADELYN TLATELPA MRN: [id number] DOB: 1965/10/14  Situation: Referral received for Complex Care Management related to SDOH Barriers:  Housing and Safety  I obtained verbal consent from patient.  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  s/p PCTA to dRCA and ostial RPAV-PLA vessel + PDA branch (05/2012)  b. [place] s/p DES to mLAD - Resolute DES 3.0 x 22 (3.26mm -->3.3 mm)   Bipolar depression (HCC)    Breast abscess    a. right side.    COPD (chronic obstructive pulmonary disease) (HCC)    Depression    Diabetic peripheral neuropathy (HCC)    Fibromyalgia    Genital warts    GERD (gastroesophageal reflux disease)    History of hiatal hernia    HLD (hyperlipidemia)    Hypertension    Lung cancer (HCC)    Pain in limb    a. LE VENOUS DUPLEX, 02/05/2009 - no evidence of deep vein thrombosis, Baker's cyst   Pneumonia    PTSD (post-traumatic stress disorder)    Sickle cell trait (HCC)    Tobacco abuse    Tooth caries    Type II diabetes mellitus (HCC)     Assessment: Patient Reported Symptoms:  Cognitive    Neurological      HEENT      Cardiovascular      Respiratory      Endocrine      Gastrointestinal      Genitourinary      Integumentary      Musculoskeletal      Psychosocial       There were no vitals filed for this visit.  Medications Reviewed Today   Medications were not reviewed in this encounter     Recommendation:   Patient will contact Housing Authority to follow up on paperwork submitted by the provider requesting a ground level unit.  Patient will contact [facility] staff to inquire about Life Alert system.  Follow Up Plan:   Telephone follow  up appointment date/time:  04/17/23 at 11:30 am  Lysle Morales, BSW [place]  [facility], [facility] Social Worker Direct Dial: [phone]  Fax: [phone] Website: [email or web address]

## 2023-04-21 NOTE — Patient Instructions (Signed)
 Visit Information  Thank you for taking time to visit with me today. Please don't hesitate to contact me if I can be of assistance to you.   Following are the goals we discussed today:   Goals Addressed             This Visit's Progress    [facility] Social Work Care Plan       Problems:   Housing  and Life Alert system  CSW Clinical Goal(s):   Over the next 7 days the Patient will  contact Housing Authority to follow up on transfer request to a ground level unit and contact [facility] for life alert .  Interventions:  Social Determinants of Health in Patient with DMII, HTN, and Cancer : SDOH assessments completed: Housing  and Pharmacologist of current treatment plan related to unmet needs SW referred to [employer] to address transfer request and Heritage manager with [employer].  Patient Goals/Self-Care Activities:  Coordinate with Housing Authority and [facility] to assist with obtaining a ground unit and life alert system..  Plan:   Telephone follow up appointment with care management team member scheduled for:  04/27/23 at 11:30am        Our next appointment is by telephone on 04/27/23 at 11:30 am  Please call the care guide team at [phone] if you need to cancel or reschedule your appointment.   If you are experiencing a Mental Health or Behavioral Health Crisis or need someone to talk to, please call 911  Patient verbalizes understanding of instructions and care plan provided today and agrees to view in MyChart. Active MyChart status and patient understanding of how to access instructions and care plan via MyChart confirmed with patient.     Telephone follow up appointment with care management team member scheduled for:04/27/23 at 11;30 am   Lysle Morales, BSW [facility]  [facility], [facility] Social Worker Direct Dial: [phone]  Fax: [phone] Website: [email or web address]

## 2023-04-22 ENCOUNTER — Inpatient Hospital Stay: Attending: Internal Medicine | Admitting: Nurse Practitioner

## 2023-04-22 DIAGNOSIS — Z7189 Other specified counseling: Secondary | ICD-10-CM | POA: Diagnosis not present

## 2023-04-22 DIAGNOSIS — K5900 Constipation, unspecified: Secondary | ICD-10-CM | POA: Insufficient documentation

## 2023-04-22 DIAGNOSIS — Z8701 Personal history of pneumonia (recurrent): Secondary | ICD-10-CM | POA: Insufficient documentation

## 2023-04-22 DIAGNOSIS — R630 Anorexia: Secondary | ICD-10-CM | POA: Diagnosis not present

## 2023-04-22 DIAGNOSIS — I2510 Atherosclerotic heart disease of native coronary artery without angina pectoris: Secondary | ICD-10-CM | POA: Insufficient documentation

## 2023-04-22 DIAGNOSIS — R530 Neoplastic (malignant) related fatigue: Secondary | ICD-10-CM

## 2023-04-22 DIAGNOSIS — M7989 Other specified soft tissue disorders: Secondary | ICD-10-CM | POA: Insufficient documentation

## 2023-04-22 DIAGNOSIS — Z66 Do not resuscitate: Secondary | ICD-10-CM | POA: Insufficient documentation

## 2023-04-22 DIAGNOSIS — I081 Rheumatic disorders of both mitral and tricuspid valves: Secondary | ICD-10-CM | POA: Insufficient documentation

## 2023-04-22 DIAGNOSIS — I700 Atherosclerosis of aorta: Secondary | ICD-10-CM | POA: Insufficient documentation

## 2023-04-22 DIAGNOSIS — D3501 Benign neoplasm of right adrenal gland: Secondary | ICD-10-CM | POA: Insufficient documentation

## 2023-04-22 DIAGNOSIS — C7951 Secondary malignant neoplasm of bone: Secondary | ICD-10-CM | POA: Insufficient documentation

## 2023-04-22 DIAGNOSIS — G893 Neoplasm related pain (acute) (chronic): Secondary | ICD-10-CM | POA: Diagnosis not present

## 2023-04-22 DIAGNOSIS — F419 Anxiety disorder, unspecified: Secondary | ICD-10-CM

## 2023-04-22 DIAGNOSIS — D573 Sickle-cell trait: Secondary | ICD-10-CM | POA: Insufficient documentation

## 2023-04-22 DIAGNOSIS — Z515 Encounter for palliative care: Secondary | ICD-10-CM

## 2023-04-22 DIAGNOSIS — R296 Repeated falls: Secondary | ICD-10-CM | POA: Insufficient documentation

## 2023-04-22 DIAGNOSIS — R5383 Other fatigue: Secondary | ICD-10-CM | POA: Insufficient documentation

## 2023-04-22 DIAGNOSIS — C3491 Malignant neoplasm of unspecified part of right bronchus or lung: Secondary | ICD-10-CM

## 2023-04-22 DIAGNOSIS — E1142 Type 2 diabetes mellitus with diabetic polyneuropathy: Secondary | ICD-10-CM | POA: Insufficient documentation

## 2023-04-22 DIAGNOSIS — R634 Abnormal weight loss: Secondary | ICD-10-CM | POA: Diagnosis not present

## 2023-04-22 DIAGNOSIS — I110 Hypertensive heart disease with heart failure: Secondary | ICD-10-CM | POA: Insufficient documentation

## 2023-04-22 DIAGNOSIS — Z87891 Personal history of nicotine dependence: Secondary | ICD-10-CM | POA: Insufficient documentation

## 2023-04-22 DIAGNOSIS — Z8774 Personal history of (corrected) congenital malformations of heart and circulatory system: Secondary | ICD-10-CM | POA: Insufficient documentation

## 2023-04-22 DIAGNOSIS — Z880 Allergy status to penicillin: Secondary | ICD-10-CM | POA: Insufficient documentation

## 2023-04-22 DIAGNOSIS — I509 Heart failure, unspecified: Secondary | ICD-10-CM | POA: Insufficient documentation

## 2023-04-22 DIAGNOSIS — Z79899 Other long term (current) drug therapy: Secondary | ICD-10-CM | POA: Insufficient documentation

## 2023-04-22 DIAGNOSIS — Z5986 Financial insecurity: Secondary | ICD-10-CM | POA: Insufficient documentation

## 2023-04-22 DIAGNOSIS — C787 Secondary malignant neoplasm of liver and intrahepatic bile duct: Secondary | ICD-10-CM | POA: Insufficient documentation

## 2023-04-22 DIAGNOSIS — R0609 Other forms of dyspnea: Secondary | ICD-10-CM | POA: Insufficient documentation

## 2023-04-22 DIAGNOSIS — Z9089 Acquired absence of other organs: Secondary | ICD-10-CM | POA: Insufficient documentation

## 2023-04-22 DIAGNOSIS — W19XXXA Unspecified fall, initial encounter: Secondary | ICD-10-CM | POA: Insufficient documentation

## 2023-04-22 DIAGNOSIS — C3411 Malignant neoplasm of upper lobe, right bronchus or lung: Secondary | ICD-10-CM | POA: Insufficient documentation

## 2023-04-22 DIAGNOSIS — M797 Fibromyalgia: Secondary | ICD-10-CM | POA: Insufficient documentation

## 2023-04-22 DIAGNOSIS — M898X9 Other specified disorders of bone, unspecified site: Secondary | ICD-10-CM | POA: Insufficient documentation

## 2023-04-22 DIAGNOSIS — M533 Sacrococcygeal disorders, not elsewhere classified: Secondary | ICD-10-CM | POA: Insufficient documentation

## 2023-04-22 DIAGNOSIS — R188 Other ascites: Secondary | ICD-10-CM | POA: Insufficient documentation

## 2023-04-22 NOTE — Progress Notes (Signed)
 Palliative Medicine [facility]  [phone]) [phone] [phone]) [phone]   Name: Cassidy [name] Date: 04/22/2023 MRN: [id number]  DOB: May 04, 1965  Patient Care Team: Alfredo Martinez, MD as PCP - General (Family Medicine) O'Neal, Ronnald Ramp, MD as PCP - Cardiology (Cardiology) Emelia Loron, MD as Consulting Physician (General Surgery) Pickenpack-Cousar, Arty Baumgartner, NP as Nurse Practitioner (Hospice and Palliative Medicine) Marcos Eke, RN as Registered Nurse [facility] Clemons, Edmund Hilda Care Management   I connected with Cassidy [name] on 04/22/23 at 10:00 AM EDT by My Chart Video and verified that I am speaking with the correct person using two identifiers.   I discussed the limitations, risks, security and privacy concerns of performing an evaluation and management service by telemedicine and the availability of in-person appointments. I also discussed with the patient that there may be a patient responsible charge related to this service. The patient expressed understanding and agreed to proceed.   Other persons participating in the visit and their role in the encounter: N/A   Patient's location: Home  Provider's location: [facility]   INTERVAL HISTORY: Cassidy [name] is a 58 y.o. 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  Palliative ask to see for symptom management and goals of care.   SOCIAL HISTORY:    Cassidy [name] reports that she has quit smoking. Her smoking use included cigarettes. She started smoking about 44 years ago. She has a 22.1 pack-year smoking history. She has never used smokeless tobacco. She reports that she does not currently use drugs. She reports that she does not drink alcohol.  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  s/p PCTA to dRCA and ostial RPAV-PLA vessel + PDA branch (05/2012)  b. [place] s/p DES to mLAD - Resolute DES 3.0 x 22 (3.46mm -->3.3 mm)   Bipolar depression (HCC)    Breast abscess    a. right side.    COPD (chronic obstructive pulmonary disease) (HCC)    Depression    Diabetic peripheral neuropathy (HCC)    Fibromyalgia    Genital warts    GERD (gastroesophageal reflux disease)    History of hiatal hernia    HLD (hyperlipidemia)    Hypertension    Lung cancer (HCC)    Pain in limb    a. LE VENOUS DUPLEX, 02/05/2009 - no evidence of deep vein thrombosis, Baker's cyst   Pneumonia    PTSD (post-traumatic stress disorder)    Sickle cell trait (HCC)    Tobacco abuse    Tooth caries    Type II diabetes mellitus (HCC)     ALLERGIES:  is allergic to amoxil [amoxicillin], chantix [varenicline], suboxone [buprenorphine hcl-naloxone hcl], and emend [fosaprepitant dimeglumine].  MEDICATIONS:  Current Outpatient Medications  Medication Sig Dispense Refill   aspirin EC 81 MG tablet Take 81 mg by mouth daily.     atorvastatin (LIPITOR) 40 MG tablet TAKE 1 TABLET (40 MG TOTAL) BY MOUTH DAILY. 90 tablet 3   busPIRone (BUSPAR) 15 MG tablet Take 0.5 tablets (7.5  mg total) by mouth 2 (two) times daily. 60 tablet 3   Carboxymethylcellul-Glycerin (LUBRICATING EYE DROPS OP) Place 1 drop into both eyes daily as needed (dry eyes).     carvedilol (COREG) 6.25 MG tablet Take 2 tablets (12.5 mg total) by mouth 2 (two) times daily with a meal. 60 tablet 0   diclofenac Sodium (VOLTAREN) 1 % GEL Apply 1 application topically 3 times daily as needed for pain 200 g 1   docusate sodium (COLACE) 100 MG capsule Take 1 capsule  (100 mg total) by mouth daily. 10 capsule 0   DULoxetine (CYMBALTA) 30 MG capsule Take 1 capsule (30 mg total) by mouth daily. 30 capsule 5   fentaNYL (DURAGESIC) 25 MCG/HR Place 1 patch onto the skin every 3 (three) days. 10 patch 0   ferrous sulfate 325 (65 FE) MG tablet Take 1 tablet (325 mg total) by mouth daily with breakfast. 30 tablet 2   fluticasone (FLONASE) 50 MCG/ACT nasal spray PLACE 1 SPRAY INTO BOTH NOSTRILS DAILY AS NEEDED (CONGESTION). 16 g 1   folic acid (FOLVITE) 1 MG tablet TAKE 1 TABLET (1 MG TOTAL) BY MOUTH DAILY. 30 tablet 3   glucose blood (ACCU-CHEK AVIVA PLUS) test strip USE AS INSTRUCTED EVERY DAY 50 each 1   hyoscyamine (LEVSIN SL) 0.125 MG SL tablet Place 1 tablet (0.125 mg total) under the tongue every 6 (six) hours as needed. For abdominal cramping 30 tablet 0   insulin glargine (LANTUS) 100 UNIT/ML Solostar Pen Inject 24 Units into the skin every morning. 3 mL 11   Insulin Pen Needle 32G X 4 MM MISC 1 Needle by Does not apply route in the morning and at bedtime. 120 each 2   ipratropium (ATROVENT HFA) 17 MCG/ACT inhaler Inhale 2 puffs into the lungs every 4 (four) hours as needed for wheezing (COPD).     lactulose (CHRONULAC) 10 GM/15ML solution Take 15 mLs (10 g total) by mouth daily as needed for moderate constipation or severe constipation. 236 mL 0   lidocaine-prilocaine (EMLA) cream APPLY 1 APPLICATION TOPICALLY AS NEEDED. (Patient not taking: Reported on 04/06/2023) 30 g 2   LORazepam (ATIVAN) 0.5 MG tablet Take 1 tablet (0.5 mg total) by mouth every 8 (eight) hours as needed for anxiety (nausea). 30 tablet 0   metoCLOPramide (REGLAN) 10 MG tablet Take 1 tablet (10 mg total) by mouth every 8 (eight) hours as needed for nausea. 60 tablet 2   mometasone-formoterol (DULERA) 100-5 MCG/ACT AERO Inhale 2 puffs into the lungs in the morning and at bedtime. 13 g 3   nystatin (MYCOSTATIN) 100000 UNIT/ML suspension Take 5 mLs (500,000 Units total) by mouth 4 (four) times  daily. 473 mL 2   ondansetron (ZOFRAN-ODT) 8 MG disintegrating tablet Take 1 tablet (8 mg total) by mouth every 8 (eight) hours as needed for nausea or vomiting. 30 tablet 2   oxyCODONE (ROXICODONE) 15 MG immediate release tablet Take 1-2 tablets (15-30 mg total) by mouth every 4 (four) hours as needed for pain. 120 tablet 0   pantoprazole (PROTONIX) 40 MG tablet Take 1 tablet (40 mg total) by mouth 2 (two) times daily. 60 tablet 5   polyethylene glycol powder (GAVILAX) 17 GM/SCOOP powder Take 17 g by mouth daily. 850 g 1   spironolactone (ALDACTONE) 50 MG tablet Take 1 tablet (50 mg total) by mouth at bedtime. 90 tablet 0   torsemide 40 MG TABS Take 100 mg by mouth daily. 30 tablet 0   valACYclovir (VALTREX)  1000 MG tablet Take for 3 days prn each outbreak. (Patient taking differently: Take 1,000 mg by mouth daily. Take for 3 days prn each outbreak.) 30 tablet 11   No current facility-administered medications for this visit.    VITAL SIGNS: LMP 06/12/2016 (Approximate)  There were no vitals filed for this visit.  Estimated body mass index is 30.01 kg/m as calculated from the following:   Height as of 04/08/23: 5\' 7"  (1.702 m).   Weight as of 04/10/23: 191 lb 9.3 oz (86.9 kg).   PERFORMANCE STATUS (ECOG) : 1 - Symptomatic but completely ambulatory  Discussed the use of AI scribe software for clinical note transcription with the patient, who gave verbal consent to proceed.   IMPRESSION: .   I connected with Cassidy [name] by video for symptom management follow-up. No acute distress. Recent hospitalization due to fluid overload . States she is feeling much better since discharge. Continues to take things one day at a time. Feels appetite slowly improving. Some days are better than others. She has no recent changes in her weight however continues to struggle with the fact of the amount of weight she has loss over the months. Denies concerns with nausea, vomiting, constipation, or diarrhea.    Cassidy [name] shares she is working with social work team and housing authority to gain better housing that will accommodate her health challenges. Also in the process of obtaining bedside commode and other home DME including CNA assistance in the home for ADLs.   She requires assistance with mobility, using a walker to navigate her apartment, and relies on her brother and son to help her with transportation.  We discussed her pain at length. Cassidy [name] reports pain is well controlled given changes to regimen over the past week, specifically initiating Fentanyl patch. Tolerating without difficulty. Her current regimen consist of Fentanyl patch , oxycodone 15-30mg  every 4 hours as needed, and lorazepam 0.5mg  as needed. No adjustments at this time. We will continue to closely monitor.   Goals of Care 04/06/23: We discussed her overall condition and ongoing health decline. She continues to take things one day at a time but speaks to her awareness of decline. She is emotional expressing the moments she is spending with her family.   She recently enjoyed a trip to the beach with family, which she found uplifting despite not being able to use the jacuzzi due to [facility] drain. She is emotional sharing this was one of her best trips and spending time with her son who facilitated the trip. Shares she knows "what his intentions" were with organizing the trip and she is thankful for those moments. She expresses gratitude for the support and advocacy from her brother's girlfriend, who is helping her access resources and potentially relocate from her current apartment. Her cat is being cared for by a neighbor during her absence.  Cassidy [name] is aware that she is a candidate for in-home hospice and palliative support however remains reluctant expressing no desire to disconnect from her current medical team. Education provided on the supportive services they can offer. She is not ready to pursue.  02/22/23: Cassidy [name]  continues to be clear in her expressed goals to treat the treatable while aggressively managing her symptoms. She desires not to be in a suffering state as her quality of life is important to her. She is not ready to consider hospice care at this time.   01/19/23: I empathetically approached discussions regarding goals of care, quality of  life, and healthcare limitations. Although patient's recent scans do not show significant cancer progression as discussed with Cassie, PA Cassidy [name]'s quality of life is clearly poor and symptom burden is high. She is realistic in her understanding of her current cancer state in addition to other co-morbidities. Cassidy [name] is clear in her expressed wishes that her quality of life is most important to her and not being in a suffering state. She is tearful sharing she knows that time is limited. The patient has began preparing her son for this however he is not accepting at this time as the only child. Cassidy [name] has spoken with this girlfriend who acknowledges her decline and is assisting in preparing patient's son for the inevitable. Emotional support provided.   Cassidy [name] states she has no desire for her life to be prolonged if there is no quality. We discussed her full code status with consideration of her current illness. She shares desires for a natural death and her life to not be prolonged by life-sustaining measures including CPR or intubation. Education provided on DNR/DNI. She expressed understanding and agreement with the concept due to her deteriorating health status. She also would not want her family to struggle with such decisions. MOST form introduced and education provided on documents. The patient would like to take form home and review hoping to gain support from her son. She is interested in completing at follow-up visit. Would not wish for artificial nutrition.   Education provided on hospice's goals and philosophy of care. We discussed what care would look like in the  home as this would be her desire. She understands this support will be needed in the future however at this time she is not read to transition her care to focus on end-of-life. States she has to be at peace specifically knowing that her son is understanding and prepared. She is also not ready to discontinue support at the cancer center or from her providers. Cassidy [name] would look like to continue taking things one day at at time for as long as she can.  The patient has also been experiencing financial difficulties, considering selling her car due to infrequent use and financial strain. Despite these challenges, the patient is proactive in managing her health, regularly checking her medical charts and maintaining communication with her healthcare team.  Support has been provided and all questions answered. Patient knows to contact office as needed.  We discussed the importance of continued conversation with family and their medical providers regarding overall plan of care and treatment options, ensuring decisions are within the context of the patients values and GOCs.  Assessment and Plan Chronic Cancer Related Pain Pain much better controlled. Tolerating regimen without difficulty. No adjustments at this time.  - Refills as needed - Fentanyl patch every three days consistently. -Oxycodone 15-30mg  every 4 hours as needed for breakthrough  pain -No adjustments at this time until current regimen is maximized.   Mobility Issues Significant mobility issues requiring assistance. Weight loss to 165 pounds contributing to weakness. Patient states family has to lift her in the car and down stairs.  - Encourage continued use of the walker for mobility. - Discuss potential resources for additional support and assistance with mobility and transportation. -Pending home health aide and DME (Bedside commode), housing assistance.   Goals of Care Receiving palliative care with a supportive network. Desires  home visits, informed not possible through current provider. Extensive education provided on the appropriateness of in-home palliative and hospice support.  - Offer  to assist with any provider-related needs for accessing resources. - Plan to check on her in a couple of weeks to assess progress and address any further needs. -Patient continues to decline hospice enrollment at this time.  -Discussed the benefits of hospice care and the implications of a Do Not Resuscitate (DNR) order. -Consider hospice care for additional support at home. -Discuss DNR order with patient's brother, who will be the guardian. -Patient has advanced directives with son and brother listed as HCPOA.   I will plan to see her back in 2-3 weeks. Sooner if needed.  Patient expressed understanding and was in agreement with this plan. She also understands that She can call the clinic at any time with any questions, concerns, or complaints.    Any controlled substances utilized were prescribed in the context of palliative care. PDMP has been reviewed.    I provided 45 minutes of face-to-face video visit time during this encounter, and > 50% was spent counseling as documented under my assessment & plan. Visit consisted of counseling and education dealing with the complex and emotionally intense issues of symptom management and palliative care in the setting of serious and potentially life-threatening illness.  Willette Alma, AGPCNP-BC  Palliative Medicine Team/[facility]

## 2023-04-25 ENCOUNTER — Other Ambulatory Visit: Payer: Self-pay | Admitting: Nurse Practitioner

## 2023-04-25 DIAGNOSIS — Z515 Encounter for palliative care: Secondary | ICD-10-CM

## 2023-04-25 DIAGNOSIS — G893 Neoplasm related pain (acute) (chronic): Secondary | ICD-10-CM

## 2023-04-25 DIAGNOSIS — C3491 Malignant neoplasm of unspecified part of right bronchus or lung: Secondary | ICD-10-CM

## 2023-04-26 ENCOUNTER — Ambulatory Visit: Payer: Self-pay | Admitting: Licensed Clinical Social Worker

## 2023-04-27 ENCOUNTER — Other Ambulatory Visit: Payer: Self-pay

## 2023-04-27 NOTE — Patient Instructions (Addendum)
 Marland Kitchen

## 2023-04-29 NOTE — Patient Outreach (Signed)
 Complex Care Management   Visit Note  04/26/2023  Name:  Cassidy [name] MRN: [id number] DOB: [birth year]  Situation: Referral received for Complex Care Management related to [facility]/Behavioral Health diagnosis MDD, GAD, PTSD, Bipolar I Disorder  I obtained verbal consent from Patient.  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  s/p PCTA to dRCA and ostial RPAV-PLA vessel + PDA branch (05/2012)  b. [place]  s/p DES to mLAD - Resolute DES 3.0 x 22 (3.38mm -->3.3 mm)   Bipolar depression (HCC)    Breast abscess    a. right side.    COPD (chronic obstructive pulmonary disease) (HCC)    Depression    Diabetic peripheral neuropathy (HCC)    Fibromyalgia    Genital warts    GERD (gastroesophageal reflux disease)    History of hiatal hernia    HLD (hyperlipidemia)    Hypertension    Lung cancer (HCC)    Pain in limb    a. LE VENOUS DUPLEX, 02/05/2009 - no evidence of deep vein thrombosis, Baker's cyst   Pneumonia    PTSD (post-traumatic stress disorder)    Sickle cell trait (HCC)    Tobacco abuse    Tooth caries    Type II diabetes mellitus (HCC)     Assessment: Patient Reported Symptoms:  Cognitive Cognitive Status: Alert and oriented to person, place, and time      Neurological Neurological Review of Symptoms: No symptoms reported    HEENT HEENT Symptoms Reported: No symptoms reported      Cardiovascular Cardiovascular Symptoms Reported: No symptoms reported    Respiratory Respiratory Symptoms Reported: No symptoms reported    Endocrine Patient reports the following symptoms related to hypoglycemia or hyperglycemia : No symptoms reported    Gastrointestinal Gastrointestinal Symptoms Reported: No symptoms reported      Genitourinary  Genitourinary Symptoms Reported: No symptoms reported    Integumentary Integumentary Symptoms Reported: No symptoms reported    Musculoskeletal Musculoskelatal Symptoms Reviewed: No symptoms reported        Psychosocial Psychosocial Symptoms Reported: No symptoms reported   Major Change/Loss/Stressor/Fears (CP): Medical condition, self Techniques to Cope with Loss/Stress/Change: Medication, Diversional activities Do you feel physically threatened by others?: No      03/29/2023    2:30 PM  Depression screen PHQ 2/9  Decreased Interest 1  Down, Depressed, Hopeless 1  PHQ - 2 Score 2  Altered sleeping 1  Tired, decreased energy 1  Change in appetite 0  Feeling bad or failure about yourself  0  Trouble concentrating 0  Moving slowly or fidgety/restless 0  Suicidal thoughts 0  PHQ-9 Score 4  Difficult doing work/chores Extremely dIfficult    There were no vitals filed for this visit.  Medications Reviewed Today     Reviewed by Asar Evilsizer D, LCSW (Social Worker) on 04/29/23 at 1210  Med List Status: <None>   Medication Order Taking? Sig Documenting Provider Last Dose Status Informant  aspirin  EC 81 MG tablet [id number] No Take 81 mg by mouth daily. [provider] Taking Active Self  atorvastatin  (LIPITOR) 40 MG tablet [id number] No TAKE 1 TABLET (40 MG TOTAL) BY MOUTH DAILY. Espinoza, Alejandra, DO Taking Active Self  busPIRone  (BUSPAR ) 15  MG tablet [id number] No Take 0.5 tablets (7.5 mg total) by mouth 2 (two) times daily. Espinoza, Alejandra, DO Taking Active Self  Carboxymethylcellul-Glycerin (LUBRICATING EYE DROPS OP) [id number] No Place 1 drop into both eyes daily as needed (dry eyes). [provider] Taking Active Self           Med Note Saverio Curling, CHERYL A   Fri Oct 22, 2022  2:20 PM) Taking as needed  carvedilol  (COREG ) 6.25 MG tablet [id number] No Take 2 tablets (12.5 mg total) by mouth 2 (two) times daily with a meal. Ernestina Headland, MD Taking Active    diclofenac  Sodium (VOLTAREN ) 1 % GEL [id number] No Apply 1 application topically 3 times daily as needed for pain Santa Cuba, MD Taking Active   docusate sodium  (COLACE) 100 MG capsule [id number] No Take 1 capsule (100 mg total) by mouth daily. Santa Cuba, MD Taking Active   DULoxetine  (CYMBALTA ) 30 MG capsule [id number] No Take 1 capsule (30 mg total) by mouth daily. Ellene Gustin, MD Taking Active Self  fentaNYL  (DURAGESIC ) 25 MCG/HR [id number] No Place 1 patch onto the skin every 3 (three) days. Pickenpack-Cousar, Giles Labrum, NP Taking Active   ferrous sulfate  325 (65 FE) MG tablet [id number] No Take 1 tablet (325 mg total) by mouth daily with breakfast. Santa Cuba, MD Taking Active   fluticasone  (FLONASE ) 50 MCG/ACT nasal spray [id number] No PLACE 1 SPRAY INTO BOTH NOSTRILS DAILY AS NEEDED (CONGESTION). Dema Filler, MD Taking Active   folic acid  (FOLVITE ) 1 MG tablet [id number] No TAKE 1 TABLET (1 MG TOTAL) BY MOUTH DAILY. Marlene Simas, MD Taking Active   glucose blood (ACCU-CHEK AVIVA PLUS) test strip [id number] No USE AS INSTRUCTED EVERY DAY Azell Boll, MD Taking Active   hyoscyamine  (LEVSIN SL) 0.125 MG SL tablet [id number] No Place 1 tablet (0.125 mg total) under the tongue every 6 (six) hours as needed. For abdominal cramping Heilingoetter, Cassandra L, PA-C Taking Active Self  insulin  glargine (LANTUS ) 100 UNIT/ML Solostar Pen [id number] No Inject 24 Units into the skin every morning. Espinoza, Alejandra, DO Taking Active Self  Insulin  Pen Needle 32G X 4 MM MISC [id number] No 1 Needle by Does not apply route in the morning and at bedtime. Espinoza, Alejandra, DO Taking Active Self  ipratropium (ATROVENT  HFA) 17 MCG/ACT inhaler [id number] No Inhale 2 puffs into the lungs every 4 (four) hours as needed for wheezing (COPD). [provider] Taking Active Self  lactulose  (CHRONULAC ) 10 GM/15ML solution [id number] No Take 15 mLs (10 g total) by mouth daily as needed for moderate  constipation or severe constipation. Shitarev, Dimitry, MD Taking Active   lidocaine -prilocaine  (EMLA ) cream [id number] No APPLY 1 APPLICATION TOPICALLY AS NEEDED.  Patient not taking: Reported on 04/06/2023   Heilingoetter, Leita Purdue, PA-C Not Taking Active Self           Med Note Saverio Curling, CHERYL A   Mon Oct 04, 2022  1:49 PM) For port access comfort  LORazepam  (ATIVAN ) 0.5 MG tablet [id number] No Take 1 tablet (0.5 mg total) by mouth every 8 (eight) hours as needed for anxiety (nausea). Pickenpack-Cousar, Giles Labrum, NP Taking Active   metoCLOPramide  (REGLAN ) 10 MG tablet [id number] No Take 1 tablet (10 mg total) by mouth every 8 (eight) hours as needed for nausea. Pickenpack-Cousar, Giles Labrum, NP Taking Active   mometasone -formoterol  (DULERA ) 100-5 MCG/ACT AERO [id number] No Inhale 2 puffs into the lungs in the morning and at bedtime. Santa Cuba, MD Taking Active   nystatin  (MYCOSTATIN )  100000 UNIT/ML suspension [id number] No Take 5 mLs (500,000 Units total) by mouth 4 (four) times daily. Pickenpack-Cousar, Giles Labrum, NP Taking Active   ondansetron  (ZOFRAN -ODT) 8 MG disintegrating tablet [id number] No Take 1 tablet (8 mg total) by mouth every 8 (eight) hours as needed for nausea or vomiting. Heilingoetter, Cassandra L, PA-C Taking Active Self  oxyCODONE  (ROXICODONE ) 15 MG immediate release tablet [id number]  Take 1-2 tablets (15-30 mg total) by mouth every 4 (four) hours as needed for pain. Pickenpack-Cousar, Giles Labrum, NP  Active   pantoprazole  (PROTONIX ) 40 MG tablet [id number] No Take 1 tablet (40 mg total) by mouth 2 (two) times daily. Zehr, Jessica D, PA-C Taking Active Self  polyethylene glycol powder (GAVILAX) 17 GM/SCOOP powder [id number] No Take 17 g by mouth daily. Santa Cuba, MD Taking Active   spironolactone  (ALDACTONE ) 50 MG tablet [id number] No Take 1 tablet (50 mg total) by mouth at bedtime. Veronia Goon, DO Taking Active   torsemide  40 MG TABS [id number]  Take 100 mg by mouth daily.  Omar Bibber, DO  Active   valACYclovir  (VALTREX ) 1000 MG tablet [id number] No Take for 3 days prn each outbreak.  Patient taking differently: Take 1,000 mg by mouth daily. Take for 3 days prn each outbreak.   Gabrielle Joiner, MD Taking Active Self            Recommendation:   Continue utilizing strategies discussed to assist with symptom management  Follow Up Plan:   Telephone follow-up 2-4 weeks  Alease Hunter, LCSW [facility]  [facility] Clinical Social Worker Direct Dial: [phone]  Fax: [phone] Website: [email or web address] 12:19 PM

## 2023-04-29 NOTE — Patient Instructions (Addendum)
 Visit Information  Cassidy [name] was given information about Medicaid Managed Care team care coordination services as a part of their [facility] Community Plan Medicaid benefit. Cassidy [name] verbally consented to engagement with the [facility] Managed Care team.   If you are experiencing a medical emergency, please call 911 or report to your local emergency department or urgent care.   If you have a non-emergency medical problem during routine business hours, please contact your provider's office and ask to speak with a nurse.   For questions related to your [facility], please call: [phone] or visit the homepage here: [email or web address]  If you would like to schedule transportation through your [facility], please call the following number at least 2 days in advance of your appointment: [phone]   Rides for urgent appointments can also be made after hours by calling Member Services.  Call the Behavioral Health Crisis Line at [phone], at any time, 24 hours a day, 7 days a week. If you are in danger or need immediate medical attention call 911.  If you would like help to quit smoking, call 1-800-QUIT-NOW ((514)366-3210) OR Espaol: 1-855-Djelo-Ya (1-324-401-0272) o para ms informacin haga clic aqu or Text READY to 536-644 to register via text  Cassidy [name] - following are the goals we discussed in your visit today:   Goals Addressed             This Visit's Progress    LCSW [facility] Social Work Care Plan   On track    Problems:   Disease Management support and education needs related to Anxiety with Excessive Worry,, Bipolar Disorder, Depression: depressed mood, and PTSD  CSW Clinical Goal(s):   Over the next 90 days the Patient will attend all scheduled medical appointments as evidenced by patient report and care team review of appointment completion in  electronic MEDICAL RECORD NUMBER  demonstrate a reduction in symptoms related to Anxiety with Panic Symptoms, Bipolar Disorder Depression: depressed mood PTSD .  Interventions:  Mental Health:  Evaluation of current treatment plan related to Anxiety with Panic Symptoms,, Bipolar Disorder, Depression: depressed mood, and PTSD Active listening / Reflection utilized Emotional Support Provided Mindfulness or Relaxation training provided Motivational Interviewing employed Provided general psycho-education for mental health needs Solution-Focued Strategies employed:  Patient Goals/Self-Care Activities:  Increase coping skills, healthy habits, and self-management skills  Follow up with [employer]  Plan:   Telephone follow up appointment with care management team member scheduled for:  2-4 weeks        Please see education materials related to health management provided by MyChart link.  Patient verbalizes understanding of instructions and care plan provided today and agrees to view in MyChart. Active MyChart status and patient understanding of how to access instructions and care plan via MyChart confirmed with patient.     Social Worker will f/up in 5/13 at 11:30 AM.   Alease Hunter, LCSW [facility]  [facility], [facility] Clinical Social Worker Direct Dial: [phone]  Fax: [phone] Website: [email or web address] 12:20 PM   Following is a copy of your plan of care:  There are no care plans that you recently modified to display for this patient.

## 2023-05-04 ENCOUNTER — Inpatient Hospital Stay: Payer: Medicaid Other | Admitting: Internal Medicine

## 2023-05-04 ENCOUNTER — Encounter: Payer: Self-pay | Admitting: Nurse Practitioner

## 2023-05-04 ENCOUNTER — Encounter: Payer: Self-pay | Admitting: Medical Oncology

## 2023-05-04 ENCOUNTER — Inpatient Hospital Stay (HOSPITAL_BASED_OUTPATIENT_CLINIC_OR_DEPARTMENT_OTHER): Admitting: Nurse Practitioner

## 2023-05-04 VITALS — BP 94/62 | HR 77 | Temp 98.1°F | Resp 18 | Ht 67.0 in | Wt 192.3 lb

## 2023-05-04 DIAGNOSIS — M797 Fibromyalgia: Secondary | ICD-10-CM | POA: Diagnosis not present

## 2023-05-04 DIAGNOSIS — C3490 Malignant neoplasm of unspecified part of unspecified bronchus or lung: Secondary | ICD-10-CM | POA: Diagnosis not present

## 2023-05-04 DIAGNOSIS — R530 Neoplastic (malignant) related fatigue: Secondary | ICD-10-CM

## 2023-05-04 DIAGNOSIS — R188 Other ascites: Secondary | ICD-10-CM | POA: Diagnosis not present

## 2023-05-04 DIAGNOSIS — Z515 Encounter for palliative care: Secondary | ICD-10-CM | POA: Diagnosis not present

## 2023-05-04 DIAGNOSIS — M792 Neuralgia and neuritis, unspecified: Secondary | ICD-10-CM

## 2023-05-04 DIAGNOSIS — C7951 Secondary malignant neoplasm of bone: Secondary | ICD-10-CM | POA: Diagnosis not present

## 2023-05-04 DIAGNOSIS — Z66 Do not resuscitate: Secondary | ICD-10-CM | POA: Diagnosis not present

## 2023-05-04 DIAGNOSIS — E1142 Type 2 diabetes mellitus with diabetic polyneuropathy: Secondary | ICD-10-CM | POA: Diagnosis not present

## 2023-05-04 DIAGNOSIS — I2510 Atherosclerotic heart disease of native coronary artery without angina pectoris: Secondary | ICD-10-CM | POA: Diagnosis not present

## 2023-05-04 DIAGNOSIS — G893 Neoplasm related pain (acute) (chronic): Secondary | ICD-10-CM | POA: Diagnosis not present

## 2023-05-04 DIAGNOSIS — C787 Secondary malignant neoplasm of liver and intrahepatic bile duct: Secondary | ICD-10-CM | POA: Diagnosis not present

## 2023-05-04 DIAGNOSIS — Z79899 Other long term (current) drug therapy: Secondary | ICD-10-CM | POA: Diagnosis not present

## 2023-05-04 DIAGNOSIS — M7989 Other specified soft tissue disorders: Secondary | ICD-10-CM | POA: Diagnosis not present

## 2023-05-04 DIAGNOSIS — D3501 Benign neoplasm of right adrenal gland: Secondary | ICD-10-CM | POA: Diagnosis not present

## 2023-05-04 DIAGNOSIS — R5383 Other fatigue: Secondary | ICD-10-CM | POA: Diagnosis not present

## 2023-05-04 DIAGNOSIS — K5900 Constipation, unspecified: Secondary | ICD-10-CM | POA: Diagnosis not present

## 2023-05-04 DIAGNOSIS — C3491 Malignant neoplasm of unspecified part of right bronchus or lung: Secondary | ICD-10-CM

## 2023-05-04 DIAGNOSIS — C3411 Malignant neoplasm of upper lobe, right bronchus or lung: Secondary | ICD-10-CM | POA: Diagnosis present

## 2023-05-04 DIAGNOSIS — W19XXXA Unspecified fall, initial encounter: Secondary | ICD-10-CM | POA: Diagnosis not present

## 2023-05-04 DIAGNOSIS — I110 Hypertensive heart disease with heart failure: Secondary | ICD-10-CM | POA: Diagnosis not present

## 2023-05-04 DIAGNOSIS — I081 Rheumatic disorders of both mitral and tricuspid valves: Secondary | ICD-10-CM | POA: Diagnosis not present

## 2023-05-04 DIAGNOSIS — R0609 Other forms of dyspnea: Secondary | ICD-10-CM | POA: Diagnosis not present

## 2023-05-04 DIAGNOSIS — M533 Sacrococcygeal disorders, not elsewhere classified: Secondary | ICD-10-CM | POA: Diagnosis not present

## 2023-05-04 DIAGNOSIS — Z7189 Other specified counseling: Secondary | ICD-10-CM | POA: Diagnosis not present

## 2023-05-04 DIAGNOSIS — I509 Heart failure, unspecified: Secondary | ICD-10-CM | POA: Diagnosis not present

## 2023-05-04 DIAGNOSIS — R296 Repeated falls: Secondary | ICD-10-CM | POA: Diagnosis not present

## 2023-05-04 DIAGNOSIS — M898X9 Other specified disorders of bone, unspecified site: Secondary | ICD-10-CM | POA: Diagnosis not present

## 2023-05-04 DIAGNOSIS — D573 Sickle-cell trait: Secondary | ICD-10-CM | POA: Diagnosis not present

## 2023-05-04 DIAGNOSIS — Z5986 Financial insecurity: Secondary | ICD-10-CM | POA: Diagnosis not present

## 2023-05-04 DIAGNOSIS — I700 Atherosclerosis of aorta: Secondary | ICD-10-CM | POA: Diagnosis not present

## 2023-05-04 MED ORDER — FENTANYL 25 MCG/HR TD PT72
1.0000 | MEDICATED_PATCH | TRANSDERMAL | 0 refills | Status: DC
Start: 2023-05-04 — End: 2023-06-08

## 2023-05-04 MED ORDER — GABAPENTIN 300 MG PO CAPS: 600.0000 mg | ORAL_CAPSULE | Freq: Three times a day (TID) | ORAL | 2 refills | Status: DC

## 2023-05-04 MED ORDER — OXYCODONE HCL 15 MG PO TABS: 15.0000 mg | ORAL_TABLET | ORAL | 0 refills | Status: DC | PRN

## 2023-05-04 NOTE — Progress Notes (Signed)
 [facility] Telephone:([phone]   [phone]  OFFICE PROGRESS NOTE  Ernestina Headland, MD [address] [place] [address]  DIAGNOSIS: Stage IV (T2a, N2, M1c) non-small cell lung cancer, adenocarcinoma presented with right upper lobe lung mass in addition to right hilar and mediastinal lymphadenopathy and innumerable bilateral pulmonary nodules as well as liver and bone metastasis.  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    Detected Alteration(s) / Biomarker(s) Associated FDA-approved therapies  Clinical Trial Availability      % cfDNA or Amplification   KRAS G12C  approved by FDA Adagrasib, Sotorasib Yes   5.3%   IDH1 R132L  approved in other indication Ivosidenib, Olutasidenib Yes      1.9%   PD-L1 expression 89%    PRIOR THERAPY: 1) Palliative radiation to the painful metastatic bone lesions at L4 and the sacrum under the care of Dr. Jeryl Moris. Last day on 10/22/21  2) Systemic chemotherapy with carboplatin  for AUC of 5, Alimta 500 Mg/M2 and Keytruda  200 Mg IV every 3 weeks.  First dose September 02, 2021.  Status post 19 cycles.  Starting from cycle #5, the patient will start maintenance Alimta and Keytruda  IV every 3 weeks. Starting from cycle #10, reduced the dose of Alimta to 400 mg/m2 due to intolerance. Last dose on 09/15/22. Discontinued due to patient request   CURRENT THERAPY: Observation  INTERVAL HISTORY: Cassidy [name] 58 y.o. female returns to the clinic today for follow-up accompanied by friend. Discussed the use of AI scribe software for clinical note transcription with the patient, who gave verbal consent to proceed.  History of Present Illness   Cassidy [name] is a 58 year old female with stage four non-small cell lung cancer who presents with concerns about nerve pain and swelling.  Diagnosed with stage four non-small cell lung cancer, adenocarcinoma, in August 2023, with a positive KRAS G12C mutation and PD-L1 expression of  89%. Initial treatment included palliative radiation for painful metastatic bone lesions, followed by systemic chemotherapy with carboplatin , Alimta, and Keytruda  for four cycles. Maintenance therapy with Alimta and Keytruda  continued for 19 cycles, discontinued in September 2024 at her request, with no evidence of disease progression at that time.  She experiences significant nerve sensations in her legs and is concerned about a painful spot on her hip, described as a knot that prevents her from lying on that side. Previous imaging did not reveal abnormalities in that area. She has not consulted a neurologist for these symptoms.  She has experienced eight falls and now uses a walker. Since discontinuing treatment, she no longer experiences vomiting. Swelling persists but is less severe, and she is urinating regularly. She is not currently taking Lasix  but is on other unspecified medications for swelling.  Her medical history includes heart disease, with a previous triple bypass surgery. She is under the care of a cardiologist for congestive heart failure, managed with medications including Lasix . She reports moderate volume ascites. No regular alcohol  consumption, only drinking occasionally during holidays.  She faces mobility challenges and is seeking assistance to move to a more accessible living situation, as she currently resides on the second floor.       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  s/p PCTA to dRCA and ostial RPAV-PLA vessel + PDA branch (05/2012)  b. [place]  s/p DES to mLAD - Resolute DES 3.0 x 22 (3.52mm -->3.3 mm)   Bipolar depression (HCC)    Breast abscess    a. right side.    COPD (chronic obstructive pulmonary disease) (HCC)    Depression    Diabetic peripheral  neuropathy (HCC)    Fibromyalgia    Genital warts    GERD (gastroesophageal reflux disease)    History of hiatal hernia    HLD (hyperlipidemia)    Hypertension    Lung cancer (HCC)    Pain in limb    a. LE VENOUS DUPLEX, 02/05/2009 - no evidence of deep vein thrombosis, Baker's cyst   Pneumonia    PTSD (post-traumatic stress disorder)    Sickle cell trait (HCC)    Tobacco abuse    Tooth caries    Type II diabetes mellitus (HCC)     ALLERGIES:  is allergic to amoxil  [amoxicillin ], chantix [varenicline], suboxone [buprenorphine hcl-naloxone hcl], and emend [fosaprepitant  dimeglumine].  MEDICATIONS:  Current Outpatient Medications  Medication Sig Dispense Refill   aspirin  EC 81 MG tablet Take 81 mg by mouth daily.     atorvastatin  (LIPITOR) 40 MG tablet TAKE 1 TABLET (40 MG TOTAL) BY MOUTH DAILY. 90 tablet 3   busPIRone  (BUSPAR ) 15 MG tablet Take 0.5 tablets (7.5 mg total) by mouth 2 (two) times daily. 60 tablet 3   Carboxymethylcellul-Glycerin (LUBRICATING EYE DROPS OP) Place 1 drop into both eyes daily as needed (dry eyes).     carvedilol  (COREG ) 6.25 MG tablet Take 2 tablets (12.5 mg total) by mouth 2 (two) times daily with a meal. 60 tablet 0   diclofenac  Sodium (VOLTAREN ) 1 % GEL Apply 1 application topically 3 times daily as needed for pain 200 g 1   docusate sodium  (COLACE) 100 MG capsule Take 1 capsule (100 mg total) by mouth daily. 10 capsule 0   DULoxetine  (CYMBALTA ) 30 MG capsule Take 1 capsule (30 mg total) by mouth daily. 30 capsule 5   fentaNYL  (DURAGESIC ) 25 MCG/HR Place 1 patch onto the skin every 3 (three) days. 10 patch 0   ferrous sulfate  325 (65 FE) MG tablet Take 1 tablet (325 mg total) by mouth daily with breakfast. 30 tablet 2   fluticasone  (FLONASE ) 50 MCG/ACT nasal spray PLACE 1 SPRAY INTO BOTH NOSTRILS DAILY AS NEEDED (CONGESTION). 16 g 1   folic acid  (FOLVITE ) 1 MG tablet TAKE 1 TABLET (1 MG TOTAL) BY MOUTH DAILY. 30 tablet 3   glucose blood (ACCU-CHEK AVIVA  PLUS) test strip USE AS INSTRUCTED EVERY DAY 50 each 1   hyoscyamine  (LEVSIN SL) 0.125 MG SL tablet Place 1 tablet (0.125 mg total) under the tongue every 6 (six) hours as needed. For abdominal cramping 30 tablet 0   insulin  glargine (LANTUS ) 100 UNIT/ML Solostar Pen Inject 24 Units into the skin every morning. 3 mL 11   Insulin  Pen Needle 32G X 4 MM MISC 1 Needle by Does not apply route in the morning and at bedtime. 120 each 2   ipratropium (ATROVENT  HFA) 17 MCG/ACT inhaler Inhale 2 puffs into the lungs every 4 (four) hours as needed for wheezing (COPD).     lactulose  (CHRONULAC ) 10 GM/15ML solution Take 15 mLs (10 g total) by mouth daily as needed for moderate constipation or severe constipation. 236 mL 0   lidocaine -prilocaine  (EMLA ) cream APPLY 1 APPLICATION TOPICALLY AS NEEDED. (Patient not taking:  Reported on 04/06/2023) 30 g 2   LORazepam  (ATIVAN ) 0.5 MG tablet Take 1 tablet (0.5 mg total) by mouth every 8 (eight) hours as needed for anxiety (nausea). 30 tablet 0   metoCLOPramide  (REGLAN ) 10 MG tablet Take 1 tablet (10 mg total) by mouth every 8 (eight) hours as needed for nausea. 60 tablet 2   mometasone -formoterol  (DULERA ) 100-5 MCG/ACT AERO Inhale 2 puffs into the lungs in the morning and at bedtime. 13 g 3   nystatin  (MYCOSTATIN ) 100000 UNIT/ML suspension Take 5 mLs (500,000 Units total) by mouth 4 (four) times daily. 473 mL 2   ondansetron  (ZOFRAN -ODT) 8 MG disintegrating tablet Take 1 tablet (8 mg total) by mouth every 8 (eight) hours as needed for nausea or vomiting. 30 tablet 2   oxyCODONE  (ROXICODONE ) 15 MG immediate release tablet Take 1-2 tablets (15-30 mg total) by mouth every 4 (four) hours as needed for pain. 120 tablet 0   pantoprazole  (PROTONIX ) 40 MG tablet Take 1 tablet (40 mg total) by mouth 2 (two) times daily. 60 tablet 5   polyethylene glycol powder (GAVILAX) 17 GM/SCOOP powder Take 17 g by mouth daily. 850 g 1   spironolactone  (ALDACTONE ) 50 MG tablet Take 1 tablet (50 mg  total) by mouth at bedtime. 90 tablet 0   torsemide  40 MG TABS Take 100 mg by mouth daily. 30 tablet 0   valACYclovir  (VALTREX ) 1000 MG tablet Take for 3 days prn each outbreak. (Patient taking differently: Take 1,000 mg by mouth daily. Take for 3 days prn each outbreak.) 30 tablet 11   No current facility-administered medications for this visit.    SURGICAL HISTORY:  Past Surgical History:  Procedure Laterality Date   BLADDER SURGERY  1980   "TVT"   BLADDER SURGERY  2003   "TVT"   BREAST EXCISIONAL BIOPSY     BREAST SURGERY Right    I&D for multiple abscesses   CARDIAC CATHETERIZATION     CORONARY ARTERY BYPASS GRAFT N/A 06/30/2021   Procedure: CORONARY ARTERY BYPASS GRAFTING (CABG) x3 USING LEFT INTERNAL MAMMARY ARTERY,  RIGHT RADIAL ARTERY AND LEFT GREATER SAPHENOUS VEIN;  Surgeon: Hilarie Lovely, MD;  Location: [facility] OR;  Service: Open Heart Surgery;  Laterality: N/A;   cutting balloon     ENDOVEIN HARVEST OF GREATER SAPHENOUS VEIN Left 06/30/2021   Procedure: ENDOVEIN HARVEST OF GREATER SAPHENOUS VEIN;  Surgeon: Hilarie Lovely, MD;  Location: [facility] OR;  Service: Open Heart Surgery;  Laterality: Left;   EYE SURGERY Left    laser surgery   FINE NEEDLE ASPIRATION  08/21/2021   Procedure: FINE NEEDLE ASPIRATION (FNA) LINEAR;  Surgeon: Prudy Brownie, DO;  Location: [facility] ENDOSCOPY;  Service: Pulmonary;;   INCISE AND DRAIN ABCESS  ; 04/26/11; 08/13/11   right breast   INCISION AND DRAINAGE ABSCESS Right 05/09/2012   Procedure: INCISION AND DRAINAGE RIGHT BREAST ABSCESS;  Surgeon: Kari Otto. Eli Grizzle, MD;  Location: [facility] OR;  Service: General;  Laterality: Right;   INCISION AND DRAINAGE ABSCESS Right 02/08/2014   Procedure: INCISION AND DRAINAGE RIGHT BREAST ABSCESS;  Surgeon: Dareen Ebbing, MD;  Location: [facility] OR;  Service: General;  Laterality: Right;   INCISION AND DRAINAGE ABSCESS Right 06/14/2014   Procedure: INCISION AND DRAINAGE RIGHT BREAST ABSCESS;  Surgeon: Dareen Ebbing, MD;   Location: [facility] OR;  Service: General;  Laterality: Right;   IR IMAGING GUIDED PORT INSERTION  12/08/2021   IR PARACENTESIS  02/02/2023   IR PARACENTESIS  02/23/2023   IR  PERC TUN PERIT CATH WO PORT S&I /IMAG  02/16/2023   IRRIGATION AND DEBRIDEMENT ABSCESS  12/19/2010   Procedure: IRRIGATION AND DEBRIDEMENT ABSCESS;  Surgeon: Enid Harry, MD;  Location: [facility] OR;  Service: General;  Laterality: Right;   IRRIGATION AND DEBRIDEMENT ABSCESS  08/13/2011   Procedure: MINOR INCISION AND DRAINAGE OF ABSCESS;  Surgeon: Diantha Fossa, MD;  Location: [facility] OR;  Service: General;  Laterality: Right;  Right Breast    IRRIGATION AND DEBRIDEMENT ABSCESS  11/17/2011   Procedure: IRRIGATION AND DEBRIDEMENT ABSCESS;  Surgeon: Kari Otto. Eli Grizzle, MD;  Location: [facility] OR;  Service: General;  Laterality: Right;  irrigation and debridement right recurrent breast abscess   LARYNX SURGERY     LEFT HEART CATH AND CORONARY ANGIOGRAPHY N/A 04/09/2021   Procedure: LEFT HEART CATH AND CORONARY ANGIOGRAPHY;  Surgeon: Lucendia Rusk, MD;  Location: [facility] INVASIVE CV LAB;  Service: Cardiovascular;  Laterality: N/A;   LEFT HEART CATHETERIZATION WITH CORONARY ANGIOGRAM N/A 05/29/2012   Procedure: LEFT HEART CATHETERIZATION WITH CORONARY ANGIOGRAM & PTCA;  Surgeon: Millicent Ally, MD;  Location: [facility] CATH LAB;  Service: Cardiovascular; Bifurcation dRCA-RPAD/PLA & rPDA  PTCA.   LEFT HEART CATHETERIZATION WITH CORONARY ANGIOGRAM N/A 11/08/2013   Procedure: LEFT HEART CATHETERIZATION WITH CORONARY ANGIOGRAM and Coronary Stent Intervention;  Surgeon: Arleen Lacer, MD;  Location: [facility] CATH LAB;  Service: Cardiovascular; mLAD 80% (FFR 0.73) - resolute DES 3.0 x 22 mm (postdilated to 3.3 mm->3.5 mm)   NM MYOVIEW  LTD  01/26/2009   Normal study, no evidence of ischemia, EF 67%   ployp removed from voice box 03/30/12     RADIAL ARTERY HARVEST Right 06/30/2021   Procedure: RADIAL ARTERY HARVEST;  Surgeon: Hilarie Lovely, MD;  Location: [facility] OR;   Service: Open Heart Surgery;  Laterality: Right;   REFRACTIVE SURGERY  ~ 2010   right   TEE WITHOUT CARDIOVERSION N/A 06/30/2021   Procedure: TRANSESOPHAGEAL ECHOCARDIOGRAM (TEE);  Surgeon: Hilarie Lovely, MD;  Location: [facility] OR;  Service: Open Heart Surgery;  Laterality: N/A;   TONSILLECTOMY  1988   VIDEO BRONCHOSCOPY WITH ENDOBRONCHIAL ULTRASOUND Bilateral 08/21/2021   Procedure: VIDEO BRONCHOSCOPY WITH ENDOBRONCHIAL ULTRASOUND;  Surgeon: Prudy Brownie, DO;  Location: [facility] ENDOS[facility];  Service: Pulmonary;  Laterality: Bilateral;    REVIEW OF SYSTEMS:  Constitutional: positive for fatigue Eyes: negative Ears, nose, mouth, throat, and face: negative Respiratory: positive for dyspnea on exertion Cardiovascular: negative Gastrointestinal: negative Genitourinary:negative Integument/breast: negative Hematologic/lymphatic: negative Musculoskeletal:positive for bone pain Neurological: negative Behavioral/Psych: negative Endocrine: negative Allergic/Immunologic: negative   PHYSICAL EXAMINATION: General appearance: alert, cooperative, fatigued, and no distress Head: Normocephalic, without obvious abnormality, atraumatic Neck: no adenopathy, no JVD, supple, symmetrical, trachea midline, and thyroid  not enlarged, symmetric, no tenderness/mass/nodules Lymph nodes: Cervical, supraclavicular, and axillary nodes normal. Resp: clear to auscultation bilaterally Back: symmetric, no curvature. ROM normal. No CVA tenderness. Cardio: regular rate and rhythm, S1, S2 normal, no murmur, click, rub or gallop GI: soft, non-tender; bowel sounds normal; no masses,  no organomegaly Extremities: edema 1+ edema bilaterally Neurologic: Alert and oriented X 3, normal strength and tone. Normal symmetric reflexes. Normal coordination and gait  ECOG PERFORMANCE STATUS: 1 - Symptomatic but completely ambulatory  Blood pressure 94/62, pulse 77, temperature 98.1 F (36.7 C), resp. rate 18, height 5\' 7"  (1.702  m), weight 192 lb 4.8 oz (87.2 kg), last menstrual period 06/12/2016, SpO2 100%.  LABORATORY DATA: Lab Results  Component Value Date   WBC 4.5 04/10/2023  HGB 8.8 (L) 04/10/2023   HCT 25.7 (L) 04/10/2023   MCV 92.4 04/10/2023   PLT 292 04/10/2023      Chemistry      Component Value Date/Time   NA 137 04/10/2023 0515   NA 136 03/31/2023 1105   K 3.4 (L) 04/10/2023 0515   CL 101 04/10/2023 0515   CO2 28 04/10/2023 0515   BUN 15 04/10/2023 0515   BUN 16 03/31/2023 1105   CREATININE 0.98 04/10/2023 0515   CREATININE 1.04 (H) 01/19/2023 1026   CREATININE 0.65 11/06/2013 1520      Component Value Date/Time   CALCIUM  7.9 (L) 04/10/2023 0515   ALKPHOS 50 04/10/2023 0515   AST 14 (L) 04/10/2023 0515   AST 10 (L) 01/19/2023 1026   ALT 8 04/10/2023 0515   ALT <5 01/19/2023 1026   BILITOT 0.4 04/10/2023 0515   BILITOT 0.3 03/31/2023 1105   BILITOT 0.6 01/19/2023 1026       RADIOGRAPHIC STUDIES: CT CHEST ABDOMEN PELVIS W CONTRAST Result Date: 04/24/2023 CLINICAL DATA:  Right lung cancer restaging * Tracking Code: BO * EXAM: CT CHEST, ABDOMEN, AND PELVIS WITH CONTRAST TECHNIQUE: Multidetector CT imaging of the chest, abdomen and pelvis was performed following the standard protocol during bolus administration of intravenous contrast. RADIATION DOSE REDUCTION: This exam was performed according to the departmental dose-optimization program which includes automated exposure control, adjustment of the mA and/or kV according to patient size and/or use of iterative reconstruction technique. CONTRAST:  OMNIPAQUE  IOHEXOL  300 MG/ML  SOLN COMPARISON:  CT chest, 01/07/2023, CT abdomen pelvis, 12/14/2022 FINDINGS: CT CHEST FINDINGS Cardiovascular: Aortic atherosclerosis. Left chest port catheter. Normal heart size. Extensive three-vessel coronary artery calcifications status post median sternotomy and CABG. No pericardial effusion. Mediastinum/Nodes: No enlarged mediastinal, hilar, or axillary  lymph nodes. Thyroid  gland, trachea, and esophagus demonstrate no significant findings. Lungs/Pleura: Unchanged spiculated nodule of the peripheral right upper lobe measuring 1.7 x 1.0 cm (series 6, image 40). New, dense, elongated subpleural consolidation of the peripheral posterior right upper lobe difficult to measure due to elongated, irregular morphology but at least 5.6 x 3.4 x 1.7 cm (series 4, image 97, series 6, image 61). Trace right pleural effusion and bibasilar atelectasis or consolidation, new compared to prior examination. Unchanged trace left pleural effusion without significant airspace disease. Musculoskeletal: No chest wall abnormality. Severe anasarca. No acute osseous findings. CT ABDOMEN PELVIS FINDINGS Hepatobiliary: No solid liver abnormality is seen. Unchanged benign cyst or hemangioma of the medial hepatic segment VIII, measuring 2.2 x 1.4 cm (series 2, image 50). No gallstones, gallbladder wall thickening, or biliary dilatation. Pancreas: Unremarkable. No pancreatic ductal dilatation or surrounding inflammatory changes. Spleen: Normal in size without significant abnormality. Adrenals/Urinary Tract: Unchanged, benign right adrenal adenoma, requiring no further follow-up or characterization. Normal left adrenal. Kidneys are normal, without renal calculi, solid lesion, or hydronephrosis. Bladder is unremarkable. Stomach/Bowel: Stomach is within normal limits. Appendix appears normal. No evidence of bowel wall thickening, distention, or inflammatory changes. Vascular/Lymphatic: Aortic atherosclerosis. No enlarged abdominal or pelvic lymph nodes. Reproductive: No mass or other abnormality. Other: No abdominal wall hernia. Severe anasarca. Right lower quadrant tunneled peritoneal drainage catheter. Moderate volume ascites throughout the abdomen and pelvis. Musculoskeletal: No acute osseous findings. Unchanged sclerotic fracture deformity of S1 (series 5, image 107). IMPRESSION: 1. Unchanged  spiculated nodule of the peripheral right upper lobe measuring 1.7 x 1.0 cm. 2. New, dense, elongated subpleural consolidation of the peripheral posterior right upper lobe, as well as right  bibasilar atelectasis or consolidation and trace pleural effusion. This is generally nonspecific and infectious or inflammatory; drug toxicity would be a differential consideration. Subacute pulmonary infarction secondary to pulmonary embolism could also have this appearance; no directly visualized embolus on this non tailored examination. 3. Unchanged trace left pleural effusion without significant airspace disease. 4. Severe anasarca. 5. Moderate volume ascites throughout the abdomen and pelvis. Right lower quadrant tunneled peritoneal drainage catheter. 6. Coronary artery disease. Aortic Atherosclerosis (ICD10-I70.0). Electronically Signed   By: Fredricka Jenny M.D.   On: 04/24/2023 15:58   US  Paracentesis Result Date: 04/09/2023 INDICATION: Patient with a history of metastatic lung cancer with malignant ascites. Patient has an abdominal PleurX catheter that was placed in IR 02/16/2023. Patient presented to the hospital for assistance with her ascites as she had run out of supplies at home. Primary team asked IR to evaluate drain to make sure it was working properly and to also perform paracentesis/drain ascites. EXAM: PARACENTESIS THROUGH EXISTING CATHETER MEDICATIONS: None. COMPLICATIONS: None immediate. PROCEDURE: Using a catheter access kit, the PleurX was accessed and the catheter was aspirated with successful return of fluid. The catheter was then attached to the proper tubing and drained via a negative pressure container. 2 L chylous fluid removed. FINDINGS: A total of approximately 2 L of chylous fluid fluid was removed. IMPRESSION: Successful drainage of ascites via Pleurx catheter yielding 2 liters of peritoneal fluid. The ordering provider was made aware that the PleurX is functioning as expected and going forward  the floor nurse will need to order though PleurX supplies from central supply. The patient will also need to be set up with the appropriate home health services at discharge to manage her PleurX supplies. Procedure performed by Jetta Morrow NP Electronically Signed   By: Elene Griffes M.D.   On: 04/09/2023 14:33   ECHOCARDIOGRAM COMPLETE Result Date: 04/09/2023    ECHOCARDIOGRAM REPORT   Patient Name:   JENNALYNN RIVARD Date of Exam: 04/09/2023 Medical Rec #:  [id number]       Height:       67.0 in Accession #:    [id number]      Weight:       202.8 lb Date of Birth:  1965/05/01      BSA:          2.034 m Patient Age:    57 years        BP:           118/75 mmHg Patient Gender: F               HR:           71 bpm. Exam Location:  Inpatient Procedure: 2D Echo, Color Doppler and Cardiac Doppler (Both Spectral and Color            Flow Doppler were utilized during procedure). Indications:    CHFAcute Diastolic I50.31  History:        Patient has prior history of Echocardiogram examinations, most                 recent 09/29/2022.  Sonographer:    Hersey Lorenzo RDCS Referring Phys: [id number] MOHAMMAD L GARBA IMPRESSIONS  1. Left ventricular ejection fraction, by estimation, is 65 to 70%. The left ventricle has normal function. The left ventricle has no regional wall motion abnormalities. Left ventricular diastolic parameters were normal.  2. Right ventricular systolic function is normal. The right ventricular size is normal.  3. The mitral valve  is normal in structure. Mild mitral valve regurgitation. No evidence of mitral stenosis.  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  5. The aortic valve is tricuspid. Aortic valve regurgitation is not visualized. No aortic stenosis is present.  6. The inferior vena cava is normal in size with greater than 50% respiratory variability, suggesting right atrial pressure of 3 mmHg. FINDINGS  Left Ventricle: Left ventricular ejection fraction, by estimation, is 65 to 70%. The left ventricle  has normal function. The left ventricle has no regional wall motion abnormalities. The left ventricular internal cavity size was normal in size. There is  no left ventricular hypertrophy. Left ventricular diastolic parameters were normal. Normal left ventricular filling pressure. Right Ventricle: The right ventricular size is normal. No increase in right ventricular wall thickness. Right ventricular systolic function is normal. Left Atrium: Left atrial size was normal in size. Right Atrium: Right atrial size was normal in size. Prominent Eustachian valve. Pericardium: There is no evidence of pericardial effusion. Mitral Valve: The mitral valve is normal in structure. Mild mitral valve regurgitation. No evidence of mitral valve stenosis. Tricuspid Valve: The tricuspid valve is myxomatous. Tricuspid valve regurgitation is trivial. No evidence of tricuspid stenosis. There is mild prolapse of the tricuspid. Aortic Valve: The aortic valve is tricuspid. Aortic valve regurgitation is not visualized. No aortic stenosis is present. Pulmonic Valve: The pulmonic valve was not well visualized. Pulmonic valve regurgitation is not visualized. No evidence of pulmonic stenosis. Aorta: The aortic root is normal in size and structure. Venous: The inferior vena cava is normal in size with greater than 50% respiratory variability, suggesting right atrial pressure of 3 mmHg. IAS/Shunts: No atrial level shunt detected by color flow Doppler. Additional Comments: Moderate ascites is present.  LEFT VENTRICLE PLAX 2D LVIDd:         3.90 cm   Diastology LVIDs:         2.50 cm   LV e' medial:    8.81 cm/s LV PW:         1.10 cm   LV E/e' medial:  6.8 LV IVS:        1.20 cm   LV e' lateral:   9.68 cm/s LVOT diam:     1.90 cm   LV E/e' lateral: 6.2 LV SV:         51 LV SV Index:   25 LVOT Area:     2.84 cm  RIGHT VENTRICLE RV S prime:     8.92 cm/s TAPSE (M-mode): 1.2 cm LEFT ATRIUM           Index        RIGHT ATRIUM           Index LA diam:       3.70 cm 1.82 cm/m   RA Area:     10.30 cm LA Vol (A4C): 24.7 ml 12.14 ml/m  RA Volume:   19.30 ml  9.49 ml/m  AORTIC VALVE LVOT Vmax:   99.30 cm/s LVOT Vmean:  73.900 cm/s LVOT VTI:    0.180 m  AORTA Ao Root diam: 3.20 cm Ao Asc diam:  3.30 cm MITRAL VALVE               TRICUSPID VALVE MV Area (PHT): 4.29 cm    TR Peak grad:   17.6 mmHg MV Decel Time: 177 msec    TR Vmax:        210.00 cm/s MV E velocity: 60.30 cm/s MV A velocity: 66.60 cm/s  SHUNTS MV E/A ratio:  0.91        Systemic VTI:  0.18 m                            Systemic Diam: 1.90 cm Luana Rumple MD Electronically signed by Luana Rumple MD Signature Date/Time: 04/09/2023/2:07:34 PM    Final    DG Chest Portable 1 View Result Date: 04/08/2023 CLINICAL DATA:  Fluid overload. EXAM: PORTABLE CHEST 1 VIEW COMPARISON:  October 29, 2022. FINDINGS: The heart size and mediastinal contours are within normal limits. Status post coronary artery bypass graft. Left internal jugular Port-A-Cath is unchanged. Both lungs are clear. The visualized skeletal structures are unremarkable. IMPRESSION: No active disease. Electronically Signed   By: Rosalene Colon M.D.   On: 04/08/2023 19:27    ASSESSMENT AND PLAN: This is a 58 years old African-American female with Stage IV (T2a, N2, M1c) non-small cell lung cancer, adenocarcinoma presented with right upper lobe lung mass in addition to right hilar and mediastinal lymphadenopathy and innumerable bilateral pulmonary nodules as well as liver and bone metastasis.  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  Molecular studies showed positive KRAS G12C mutation and PD-L1 expression of 89%. She was treated with Palliative radiation to the painful metastatic bone lesions at L4 and the sacrum under the care of Dr. Jeryl Moris. Last day on 10/22/21.  The patient also started systemic chemotherapy with carboplatin  for AUC of 5, Alimta 500 Mg/M2 and Keytruda  200 Mg IV every 3 weeks.  First dose September 02, 2021.   Status post 19 cycles.  Starting from cycle #5, the patient will start maintenance Alimta and Keytruda  IV every 3 weeks. Starting from cycle #10, reduced the dose of Alimta to 400 mg/m2 due to intolerance. Last dose on 09/15/22. Discontinued due to patient request She is currently on observation and continues to complain of increasing fatigue as well as swelling of the lower extremities.     Stage 4 non-small cell lung cancer Stage 4 non-small cell lung cancer, adenocarcinoma, with KRAS G12C mutation and PD-L1 expression of 89%. Previously treated with palliative radiation and systemic chemotherapy, followed by maintenance treatment with Alimta and Keytruda . Treatment discontinued in September 2024 at her request with no evidence of disease progression. Current scan shows no growth, but inflammation in the right upper lobe. She reports no more vomiting since stopping treatment. - Order MRI of left hip to evaluate pain and possible nerve damage - Maintain port flushes for potential future treatment  Ascites Moderate volume ascites throughout the abdomen. No liver abnormalities or cirrhosis observed. Generalized swelling may be related to congestive heart failure. She reports regular urination and reduced swelling.  Peripheral neuropathy Peripheral neuropathy with worsening nerve damage in the legs. She reports increased difficulty walking and frequent falls, now using a walker.  Congestive heart failure Congestive heart failure with persistent generalized swelling despite diuretics. Managed by cardiologist Dr. Ninetta Basket.  Goals of Care She requested a letter to assist with housing due to her stage 4 lung cancer, indicating a need for accommodation on a flat level due to her health status. - Provide a letter stating her current care for stage 4 lung cancer to assist with housing needs   She was advised to call if she has any concerning symptoms in the interval. The patient voices understanding of  current disease status and treatment options and is in agreement with the current care plan.  All questions were answered. The patient knows to call the clinic with any problems, questions or concerns. We can certainly see the patient much sooner if necessary. The total time spent in the appointment was 30 minutes.  Disclaimer: This note was dictated with voice recognition software. Similar sounding words can inadvertently be transcribed and may not be corrected upon review.

## 2023-05-04 NOTE — Progress Notes (Signed)
 Letter given to pt.

## 2023-05-05 ENCOUNTER — Telehealth: Payer: Self-pay | Admitting: Internal Medicine

## 2023-05-05 ENCOUNTER — Encounter: Payer: Self-pay | Admitting: Internal Medicine

## 2023-05-05 NOTE — Telephone Encounter (Signed)
 Scheduled/rescheduled appointments and the patient is aware of the changes made. The patient is active on MyChart.

## 2023-05-05 NOTE — Progress Notes (Addendum)
 Palliative Medicine [facility]  Telephone:([phone]) [phone] Fax:([phone]) [phone]   Name: Cassidy [name] Date: 05/05/2023 MRN: [id number]  DOB: Jun 22, 1965  Patient Care Team: Ernestina Headland, MD as PCP - General (Family Medicine) O'Neal, Cathay Clonts, MD as PCP - Cardiology (Cardiology) Enid Harry, MD as Consulting Physician (General Surgery) Pickenpack-Cousar, Giles Labrum, NP as Nurse Practitioner (Hospice and Palliative Medicine) Randye Buttner, RN as Registered Nurse [facility] Clemons, Colette Davies Care Management Lewis, Jasmine D, LCSW as VBCI Care Management (Licensed Clinical Social Worker)    INTERVAL HISTORY: Cassidy [name] is a 58 y.o. 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  Palliative ask to see for symptom management and goals of care.   SOCIAL HISTORY:     reports that she has quit smoking. Her smoking use included cigarettes. She started smoking about 44 years ago. She has a 22.2 pack-year smoking history. She has never used smokeless tobacco. She reports that she does not currently use drugs. She reports that she does not drink alcohol .  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  s/p PCTA to dRCA and ostial RPAV-PLA vessel + PDA branch (05/2012)  b. [place]  s/p DES to mLAD - Resolute DES 3.0 x 22 (3.68mm -->3.3 mm)   Bipolar depression (HCC)    Breast abscess    a. right side.    COPD (chronic obstructive pulmonary disease) (HCC)    Depression    Diabetic peripheral neuropathy  (HCC)    Fibromyalgia    Genital warts    GERD (gastroesophageal reflux disease)    History of hiatal hernia    HLD (hyperlipidemia)    Hypertension    Lung cancer (HCC)    Pain in limb    a. LE VENOUS DUPLEX, 02/05/2009 - no evidence of deep vein thrombosis, Baker's cyst   Pneumonia    PTSD (post-traumatic stress disorder)    Sickle cell trait (HCC)    Tobacco abuse    Tooth caries    Type II diabetes mellitus (HCC)     ALLERGIES:  is allergic to amoxil  [amoxicillin ], chantix [varenicline], suboxone [buprenorphine hcl-naloxone hcl], and emend [fosaprepitant  dimeglumine].  MEDICATIONS:  Current Outpatient Medications  Medication Sig Dispense Refill   gabapentin  (NEURONTIN ) 300 MG capsule Take 2 capsules (600 mg total) by mouth 3 (three) times daily. 180 capsule 2   aspirin  EC 81 MG tablet Take 81 mg by mouth daily.     atorvastatin  (LIPITOR) 40 MG tablet TAKE 1 TABLET (40 MG TOTAL) BY MOUTH DAILY. 90 tablet 3   busPIRone  (BUSPAR ) 15 MG tablet Take 0.5 tablets (7.5 mg total) by mouth 2 (two) times daily. 60 tablet 3   Carboxymethylcellul-Glycerin (LUBRICATING EYE DROPS OP) Place 1 drop into both eyes daily as needed (dry eyes).     carvedilol  (COREG ) 6.25 MG tablet Take 2 tablets (12.5 mg total) by mouth 2 (two) times daily with a meal. 60 tablet 0   diclofenac  Sodium (VOLTAREN ) 1 % GEL Apply 1 application topically 3 times daily as needed for pain  200 g 1   docusate sodium  (COLACE) 100 MG capsule Take 1 capsule (100 mg total) by mouth daily. 10 capsule 0   DULoxetine  (CYMBALTA ) 30 MG capsule Take 1 capsule (30 mg total) by mouth daily. 30 capsule 5   fentaNYL  (DURAGESIC ) 25 MCG/HR Place 1 patch onto the skin every 3 (three) days. 10 patch 0   ferrous sulfate  325 (65 FE) MG tablet Take 1 tablet (325 mg total) by mouth daily with breakfast. 30 tablet 2   fluticasone  (FLONASE ) 50 MCG/ACT nasal spray PLACE 1 SPRAY INTO BOTH NOSTRILS DAILY AS NEEDED (CONGESTION). 16 g 1   folic acid   (FOLVITE ) 1 MG tablet TAKE 1 TABLET (1 MG TOTAL) BY MOUTH DAILY. 30 tablet 3   glucose blood (ACCU-CHEK AVIVA PLUS) test strip USE AS INSTRUCTED EVERY DAY 50 each 1   hyoscyamine  (LEVSIN SL) 0.125 MG SL tablet Place 1 tablet (0.125 mg total) under the tongue every 6 (six) hours as needed. For abdominal cramping 30 tablet 0   insulin  glargine (LANTUS ) 100 UNIT/ML Solostar Pen Inject 24 Units into the skin every morning. 3 mL 11   Insulin  Pen Needle 32G X 4 MM MISC 1 Needle by Does not apply route in the morning and at bedtime. 120 each 2   ipratropium (ATROVENT  HFA) 17 MCG/ACT inhaler Inhale 2 puffs into the lungs every 4 (four) hours as needed for wheezing (COPD).     lactulose  (CHRONULAC ) 10 GM/15ML solution Take 15 mLs (10 g total) by mouth daily as needed for moderate constipation or severe constipation. 236 mL 0   lidocaine -prilocaine  (EMLA ) cream APPLY 1 APPLICATION TOPICALLY AS NEEDED. (Patient not taking: Reported on 04/06/2023) 30 g 2   LORazepam  (ATIVAN ) 0.5 MG tablet Take 1 tablet (0.5 mg total) by mouth every 8 (eight) hours as needed for anxiety (nausea). 30 tablet 0   metoCLOPramide  (REGLAN ) 10 MG tablet Take 1 tablet (10 mg total) by mouth every 8 (eight) hours as needed for nausea. 60 tablet 2   mometasone -formoterol  (DULERA ) 100-5 MCG/ACT AERO Inhale 2 puffs into the lungs in the morning and at bedtime. 13 g 3   nystatin  (MYCOSTATIN ) 100000 UNIT/ML suspension Take 5 mLs (500,000 Units total) by mouth 4 (four) times daily. 473 mL 2   ondansetron  (ZOFRAN -ODT) 8 MG disintegrating tablet Take 1 tablet (8 mg total) by mouth every 8 (eight) hours as needed for nausea or vomiting. 30 tablet 2   [START ON 05/07/2023] oxyCODONE  (ROXICODONE ) 15 MG immediate release tablet Take 1-2 tablets (15-30 mg total) by mouth every 4 (four) hours as needed for pain. 120 tablet 0   pantoprazole  (PROTONIX ) 40 MG tablet Take 1 tablet (40 mg total) by mouth 2 (two) times daily. 60 tablet 5   polyethylene glycol  powder (GAVILAX) 17 GM/SCOOP powder Take 17 g by mouth daily. 850 g 1   spironolactone  (ALDACTONE ) 50 MG tablet Take 1 tablet (50 mg total) by mouth at bedtime. 90 tablet 0   torsemide  40 MG TABS Take 100 mg by mouth daily. 30 tablet 0   valACYclovir  (VALTREX ) 1000 MG tablet Take for 3 days prn each outbreak. (Patient taking differently: Take 1,000 mg by mouth daily. Take for 3 days prn each outbreak.) 30 tablet 11   No current facility-administered medications for this visit.    VITAL SIGNS: LMP 06/12/2016 (Approximate)  There were no vitals filed for this visit.  Estimated body mass index is 30.12 kg/m as calculated from the following:   Height as  of an earlier encounter on 05/04/23: 5\' 7"  (1.702 m).   Weight as of an earlier encounter on 05/04/23: 192 lb 4.8 oz (87.2 kg).   PERFORMANCE STATUS (ECOG) : 3 - Symptomatic, >50% confined to bed  IMPRESSION: Discussed the use of AI scribe software for clinical note transcription with the patient, who gave verbal consent to proceed.  History of Present Illness Cassidy [name] is a 58 year old female with stage four lung cancer who presents to clinic for symptom management follow-up. She is accompanied by her close friend and caregiver. Patient is ambulatory with rollator. Significant lower extremity edema. Unable to wear shoes. Currently wearing non-skid socks. She reports occasional nausea which is controlled with antiemetics. Denies concerns of diarrhea. Constipation controlled with stool softeners. Her appetite fluctuates. Some days are better than others. Current weight is 192lbs.   She has issues with anemia, feeling cold frequently, and having cold hands. She reports taking iron pills and B12. She is on torsemide  and prefers potassium supplements in dissolved packets over pills due to difficulty in crushing and swallowing the pills.   Cassidy [name] manages her own Pleurx drainage in the home. Reports now having appropriate supplies. She is  draining at least twice a week, with the last recorded drainage amount being 900 ml, which is lower than her usual 1800 ml.  She is working with DSS and housing with hopes of obtaining approval for supplemental housing specifically with an apartment on the first floor due to difficulty climbing stairs.   Patient reports current pain regimen is managing her pain overall. Increase in left hip pain which worsens with activity. Pending hip x-ray to rule out abnormalities. Some days are better than others. She is taking oxycodone  1-2 tablets every four hours as needed and uses a 25 mcg fentanyl  patch. She reports tingling and burning sensations in her feet, particularly in the mornings. She is currently taking gabapentin  600 mg in the morning and at bedtime, and Cymbalta  30 mg daily for pain management. Recommended increasing Gabapentin  600mg  to three times daily.   Goals of care Cassidy [name] continues to remain hopeful for her stability. She knows at some point she will face end-of-life however feels at this time she is doing "better" than previous weeks. She wishes to continue taking life one day at a time managing her symptoms aggressively.   04/22/23: We discussed the patient's current illness and what it means in the larger context of their on-going co-morbidities. Natural disease trajectory and expectations were discussed.  Cassidy [name] is realistic in her understanding of her current health state and ongoing decline.  She is emotional expressing her decreased appetite, muscle wasting, weakness, and life in general.  Emotional support provided.  I empathetically approach discussions regarding end-of-life including expectations in the home versus hospital versus inpatient unit.  I had a direct and honest discussion with Cassidy [name] expressing my concerns for her decline in her quality of life and unfortunately it appears that she is approaching end-of-life.  Patient is emotional acknowledging her agreement  and observation as well.  I strongly recommended she consider hospice to allow her additional support and comfort for what time she has left.  She expresses her desire not to disconnect however I advised patient myself and team can continue to be involved in her care in addition with hospice and we will gladly serve as her attending.  She verbalized understanding expressing if this was the case she would then be a more agreement also sharing  she wanted to think about it a little further and discussed with her son.  Education provided on hospice's goals and philosophy of care in addition to what symptom management would look like for patient.  Discussions regarding healthcare limitations which included DNR/DNI, no artificial feeding tubes, and the desire for natural death.  Patient states her cell phone is not is up-to-date and she is not technology savvy otherwise we will complete electronic MOST form.  I strongly recommended to Ms. Schum to proceed with emergency evaluation with understanding of limitations in her care at the time of workup.  She wishes to wait till Friday before she seeks medical assistance again referring to getting her house in order.  I discussed palliative being involved if she is admitted and also having the hospice liaison engage in discussions with patient and family.  She verbalized understanding expressing she would be open to this per recommendations.  04/06/23: We discussed her overall condition and ongoing health decline. She continues to take things one day at a time but speaks to her awareness of decline. She is emotional expressing the moments she is spending with her family.    She recently enjoyed a trip to the beach with family, which she found uplifting despite not being able to use the jacuzzi due to [facility] drain. She is emotional sharing this was one of her best trips and spending time with her son who facilitated the trip. Shares she knows "what his intentions" were  with organizing the trip and she is thankful for those moments. She expresses gratitude for the support and advocacy from her brother's girlfriend, who is helping her access resources and potentially relocate from her current apartment. Her cat is being cared for by a neighbor during her absence.   Cassidy [name] is aware that she is a candidate for in-home hospice and palliative support however remains reluctant expressing no desire to disconnect from her current medical team. Education provided on the supportive services they can offer. She is not ready to pursue.   02/22/23: Cassidy [name] continues to be clear in her expressed goals to treat the treatable while aggressively managing her symptoms. She desires not to be in a suffering state as her quality of life is important to her. She is not ready to consider hospice care at this time.    01/19/23: I empathetically approached discussions regarding goals of care, quality of life, and healthcare limitations. Although patient's recent scans do not show significant cancer progression as discussed with Cassie, PA Cassidy [name]'s quality of life is clearly poor and symptom burden is high. She is realistic in her understanding of her current cancer state in addition to other co-morbidities. Cassidy [name] is clear in her expressed wishes that her quality of life is most important to her and not being in a suffering state. She is tearful sharing she knows that time is limited. The patient has began preparing her son for this however he is not accepting at this time as the only child. Cassidy [name] has spoken with this girlfriend who acknowledges her decline and is assisting in preparing patient's son for the inevitable. Emotional support provided.    Cassidy [name] states she has no desire for her life to be prolonged if there is no quality. We discussed her full code status with consideration of her current illness. She shares desires for a natural death and her life to not be prolonged by  life-sustaining measures including CPR or intubation. Education provided on DNR/DNI. She expressed understanding and  agreement with the concept due to her deteriorating health status. She also would not want her family to struggle with such decisions. MOST form introduced and education provided on documents. The patient would like to take form home and review hoping to gain support from her son. She is interested in completing at follow-up visit. Would not wish for artificial nutrition.   I discussed the importance of continued conversation with family and their medical providers regarding overall plan of care and treatment options, ensuring decisions are within the context of the patients values and GOCs. Assessment & Plan Pain management Pain management is a priority. Reports left hip pain. Overall pain is well managed on current regimen.  - Continue oxycodone  1-2 tablets every 4 hours as needed for pain. - Continue fentanyl  patch 25 mcg. - Order x-ray for left hip to evaluate pain and discoloration  Peripheral neuropathy Peripheral neuropathy with tingling and burning in feet. Current gabapentin  dosage has limited efficacy. - Prescribe gabapentin  600 mg twice daily for one week, then increase to 600 mg three times daily if tolerated. - Continue duloxetine  30 mg daily. - Send prescription to [facility] with two refills.  Bilateral lower extremity edema Bilateral lower extremity edema, possibly related to medication or underlying conditions. - Refill torsemide  prescription. - Discuss with pharmacy to provide potassium in packet form for easier administration.  Goals of Care Discussed hospice care for symptom management and quality of life improvement. Addressed concerns about treatment cessation and port use.  Strongly encouraged patient to consider enrolling in hospice for what time she has left given poor overall prognosis and quality of life.  We had a long extensive discussion  regarding her wishes.  I empathetically expressed directly to her my concerns.  Patient expressed she is much more open to consider hospice at this time. - Discuss hospice care options during hospital admission. - Ensure understanding that hospice can manage symptoms and the port can remain for medication administration. -Patient is aware if she enrolls in hospice and would like that we generally allow their team to direct care however if the barrier is fear of loss of relationship I will happily service her attending under hospice if needed.  Follow-up I will plan to follow up with patient in 2-3 weeks via virtual visit.   Patient expressed understanding and was in agreement with this plan. She also understands that She can call the clinic at any time with any questions, concerns, or complaints.   Any controlled substances utilized were prescribed in the context of palliative care. PDMP has been reviewed.   Visit consisted of counseling and education dealing with the complex and emotionally intense issues of symptom management and palliative care in the setting of serious and potentially life-threatening illness.  Dellia Ferguson, AGPCNP-BC  Palliative Medicine Team/[facility]

## 2023-05-06 ENCOUNTER — Other Ambulatory Visit: Payer: Self-pay

## 2023-05-06 NOTE — Patient Instructions (Signed)
 Visit Information  Thank you for taking time to visit with me today. Please don't hesitate to contact me if I can be of assistance to you before our next scheduled appointment.  Your next care management appointment is by telephone on 05/20/23 at 11am.  Please call the care guide team at [phone] if you need to cancel, schedule, or reschedule an appointment.   Please call 911 if you are experiencing a Mental Health or Behavioral Health Crisis or need someone to talk to.  Dallis Dues, BSW [place]  [facility], [facility] Social Worker Direct Dial: [phone]  Fax: [phone] Website: [email or web address]

## 2023-05-06 NOTE — Patient Outreach (Signed)
 Complex Care Management   Visit Note  05/06/2023  Name:  Cassidy [name] MRN: [id number] DOB: [birth year]  Situation: Referral received for Complex Care Management related to  obtaining a life alert system and transferring [employer] voucher to another single level unit  I obtained verbal consent from Patient.  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  s/p PCTA to dRCA and ostial RPAV-PLA vessel + PDA branch (05/2012)  b. [place]  s/p DES to mLAD - Resolute DES 3.0 x 22 (3.34mm -->3.3 mm)   Bipolar depression (HCC)    Breast abscess    a. right side.    COPD (chronic obstructive pulmonary disease) (HCC)    Depression    Diabetic peripheral neuropathy (HCC)    Fibromyalgia    Genital warts    GERD (gastroesophageal reflux disease)    History of hiatal hernia    HLD (hyperlipidemia)    Hypertension    Lung cancer (HCC)    Pain in limb    a. LE VENOUS DUPLEX, 02/05/2009 - no evidence of deep vein thrombosis, Baker's cyst   Pneumonia    PTSD (post-traumatic stress disorder)    Sickle cell trait (HCC)    Tobacco abuse    Tooth caries    Type II diabetes mellitus (HCC)     Assessment: Patient reports she has not connected with [place] life alert program but agreed to call soon.  Patient contacted [employer] but did not receive a reply.  Patient then contacted a supervisor to address her request.  Patient is waiting for contact, but plans to visit [employer] 05/10/23 if she does not speak to some by phone.    Recommendation:   No provider recommendations at this time.  Follow Up Plan:   Telephone follow up appointment date/time:  05/20/23 at 11am.  Dallis Dues, BSW [place]  [facility], [facility] Social Worker Direct Dial: [phone]  Fax: [phone] Website: [email or web address]

## 2023-05-11 ENCOUNTER — Other Ambulatory Visit: Payer: Self-pay | Admitting: Neurology

## 2023-05-11 ENCOUNTER — Other Ambulatory Visit: Payer: Self-pay | Admitting: Student

## 2023-05-11 DIAGNOSIS — R209 Unspecified disturbances of skin sensation: Secondary | ICD-10-CM

## 2023-05-11 DIAGNOSIS — G620 Drug-induced polyneuropathy: Secondary | ICD-10-CM

## 2023-05-11 DIAGNOSIS — R4589 Other symptoms and signs involving emotional state: Secondary | ICD-10-CM

## 2023-05-11 DIAGNOSIS — E1142 Type 2 diabetes mellitus with diabetic polyneuropathy: Secondary | ICD-10-CM

## 2023-05-16 NOTE — Telephone Encounter (Signed)
 Called and left message to call office about Med. Refill.

## 2023-05-17 ENCOUNTER — Telehealth: Payer: Self-pay

## 2023-05-17 ENCOUNTER — Other Ambulatory Visit: Payer: Self-pay

## 2023-05-17 DIAGNOSIS — C3491 Malignant neoplasm of unspecified part of right bronchus or lung: Secondary | ICD-10-CM

## 2023-05-17 DIAGNOSIS — Z515 Encounter for palliative care: Secondary | ICD-10-CM

## 2023-05-17 DIAGNOSIS — Z7189 Other specified counseling: Secondary | ICD-10-CM

## 2023-05-17 DIAGNOSIS — M792 Neuralgia and neuritis, unspecified: Secondary | ICD-10-CM

## 2023-05-17 NOTE — Progress Notes (Unsigned)
 Orders placed and faxed to [facility] per pt request

## 2023-05-17 NOTE — Telephone Encounter (Signed)
 TC from [place] from [place] [facility] stating that patient is ready for hospice.  Informed Nikki, NP/nurse in palliative care. Fax number [phone].

## 2023-05-20 ENCOUNTER — Other Ambulatory Visit: Payer: Self-pay

## 2023-05-20 NOTE — Patient Instructions (Signed)
 Visit Information  Thank you for taking time to visit with me today. Please don't hesitate to contact me if I can be of assistance to you before our next scheduled appointment.  Your next care management appointment is by telephone on 05/31/23 at 2pm   Please call the care guide team at [phone] if you need to cancel, schedule, or reschedule an appointment.   Please call 911 if you are experiencing a Mental Health or Behavioral Health Crisis or need someone to talk to.  Dallis Dues, BSW [place]  [facility], [facility] Social Worker Direct Dial: [phone]  Fax: [phone] Website: [email or web address]

## 2023-05-20 NOTE — Patient Outreach (Signed)
 Complex Care Management   Visit Note  05/20/2023  Name:  Cassidy [name] MRN: [id number] DOB: 1965-02-01  Situation: Referral received for Complex Care Management related to Housing and life alert system I obtained verbal consent from Patient.  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  s/p PCTA to dRCA and ostial RPAV-PLA vessel + PDA branch (05/2012)  b. [place]  s/p DES to mLAD - Resolute DES 3.0 x 22 (3.72mm -->3.3 mm)   Bipolar depression (HCC)    Breast abscess    a. right side.    COPD (chronic obstructive pulmonary disease) (HCC)    Depression    Diabetic peripheral neuropathy (HCC)    Fibromyalgia    Genital warts    GERD (gastroesophageal reflux disease)    History of hiatal hernia    HLD (hyperlipidemia)    Hypertension    Lung cancer (HCC)    Pain in limb    a. LE VENOUS DUPLEX, 02/05/2009 - no evidence of deep vein thrombosis, Baker's cyst   Pneumonia    PTSD (post-traumatic stress disorder)    Sickle cell trait (HCC)    Tobacco abuse    Tooth caries    Type II diabetes mellitus (HCC)     Assessment:  Patient reports Housing Authority sent an Midwife and patients apartment failed.  The unit will be re-inspected 06/16/23.  SW does encourage patient to follow up with Housing Authority case worker on the next step.  Patient reports she is now working with [facility] and yesterday they visited to complete the assessment.  Patient has not received a  reply on the life alert system and Medicaid did not call back.  SW t/c [facility] and their program only does self pay.  SDOH Interventions    Flowsheet Row Care Coordination from 04/26/2023 in [place] POPULATION HEALTH DEPARTMENT Care Coordination from 04/12/2023 in  [facility] Care Coordination Care Coordination from 04/06/2023 in [facility] Care Coordination Care Coordination from 04/05/2023 in [facility] Care Coordination Clinical Support from 09/08/2021 in [facility] - A Dept Of [place]. [facility]  SDOH Interventions       Food Insecurity Interventions Intervention Not Indicated Intervention Not Indicated  [Foodstamps] Intervention Not Indicated Intervention Not Indicated --  Housing Interventions -- Intervention Not Indicated  [Section 8] Intervention Not Indicated Intervention Not Indicated  [Pt is interested in moving to new housing - will send housing resources.] --  Transportation Interventions Intervention Not Indicated, Patient Resources (Friends/Family) Intervention Not Indicated  [Virtual visits and family/friends help with errands] Intervention Not Indicated Intervention Not Indicated --  Utilities Interventions Intervention Not Indicated Intervention Not Indicated Intervention Not Indicated Intervention Not Indicated --  Financial Strain Interventions -- -- -- -- Artist         Recommendation:   No medical recommendations.  Follow Up Plan:   Telephone follow up appointment date/time:  05/31/23 at 2pm.  Dallis Dues, BSW [place]  [facility], [facility] Social Worker Direct Dial: [phone]  Fax: [phone] Website: [email or web address]

## 2023-05-23 ENCOUNTER — Inpatient Hospital Stay

## 2023-05-23 ENCOUNTER — Encounter: Payer: Self-pay | Admitting: Medical Oncology

## 2023-05-23 ENCOUNTER — Ambulatory Visit (HOSPITAL_COMMUNITY)
Admission: RE | Admit: 2023-05-23 | Discharge: 2023-05-23 | Disposition: A | Discharge status: Home / Self Care | Source: Ambulatory Visit | Attending: Internal Medicine | Admitting: Internal Medicine

## 2023-05-23 DIAGNOSIS — C3490 Malignant neoplasm of unspecified part of unspecified bronchus or lung: Secondary | ICD-10-CM | POA: Insufficient documentation

## 2023-05-23 DIAGNOSIS — C7951 Secondary malignant neoplasm of bone: Secondary | ICD-10-CM | POA: Diagnosis not present

## 2023-05-23 MED ORDER — GADOBUTROL 1 MMOL/ML IV SOLN
8.0000 mL | Freq: Once | INTRAVENOUS | Status: AC | PRN
  Administered 2023-05-23: 8 mL via INTRAVENOUS

## 2023-05-24 ENCOUNTER — Other Ambulatory Visit: Payer: Self-pay | Admitting: Licensed Clinical Social Worker

## 2023-05-24 NOTE — Patient Outreach (Signed)
 Complex Care Management   Visit Note  05/24/2023  Name:  Cassidy [name] MRN: [id number] DOB: [birth year]  Situation: Referral received for Complex Care Management related to [place]/Behavioral Health diagnosis PTSD, Bipolar Disorder, Depression, Anxiety I obtained verbal consent from Patient.  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  s/p PCTA to dRCA and ostial RPAV-PLA vessel + PDA branch (05/2012)  b. [place]  s/p DES to mLAD - Resolute DES 3.0 x 22 (3.73mm -->3.3 mm)   Bipolar depression (HCC)    Breast abscess    a. right side.    COPD (chronic obstructive pulmonary disease) (HCC)    Depression    Diabetic peripheral neuropathy (HCC)    Fibromyalgia    Genital warts    GERD (gastroesophageal reflux disease)    History of hiatal hernia    HLD (hyperlipidemia)    Hypertension    Lung cancer (HCC)    Pain in limb    a. LE VENOUS DUPLEX, 02/05/2009 - no evidence of deep vein thrombosis, Baker's cyst   Pneumonia    PTSD (post-traumatic stress disorder)    Sickle cell trait (HCC)    Tobacco abuse    Tooth caries    Type II diabetes mellitus (HCC)     Assessment: Patient Reported Symptoms:  Cognitive        Neurological      HEENT HEENT Symptoms Reported: No symptoms reported      Cardiovascular      Respiratory Respiratory Symptoms Reported: No symptoms reported Respiratory Conditions:  (Lung cancer)  Endocrine Patient reports the following symptoms related to hypoglycemia or hyperglycemia : No symptoms reported Is patient diabetic?: Yes Is patient checking blood sugars at home?: Yes Endocrine Conditions: Diabetes  Gastrointestinal Gastrointestinal Symptoms Reported: No symptoms reported Gastrointestinal Conditions:  Constipation    Genitourinary Genitourinary Symptoms Reported: No symptoms reported    Integumentary Integumentary Symptoms Reported: No symptoms reported    Musculoskeletal Musculoskelatal Symptoms Reviewed: Unsteady gait Additional Musculoskeletal Details: Pt had MRI on hip on 5/12 Musculoskeletal Conditions: Fibromyalgia      Psychosocial Psychosocial Symptoms Reported: No symptoms reported Additional Psychological Details: Pt reports overwhelming gratitude for the support of medical team and friends/family. She is utilizing healthy coping skills to assist with management of mental health conditions triggered by chronic health conditions Behavioral Health Conditions: Depression, Anxiety, Bipolar affective disorder, Post traumatic stress Behavioral Management Strategies: Adequate rest, Support system, Coping strategies, Medication therapy Behavioral Health Self-Management Outcome: 4 (good) Major Change/Loss/Stressor/Fears (CP): Medical condition, self Techniques to Cope with Loss/Stress/Change: Diversional activities, Medication, Meditation Quality of Family Relationships: involved, supportive Do you feel physically threatened by others?: No      03/29/2023    2:30 PM  Depression screen PHQ 2/9  Decreased Interest 1  Down, Depressed, Hopeless 1  PHQ - 2 Score 2  Altered sleeping 1  Tired, decreased energy 1  Change in appetite 0  Feeling bad or failure about yourself  0  Trouble concentrating 0  Moving slowly or fidgety/restless 0  Suicidal thoughts 0  PHQ-9 Score 4  Difficult doing work/chores Extremely dIfficult    There were no vitals filed for this visit.  Medications Reviewed Today     Reviewed  by Adriana Albany, LCSW (Social Worker) on 05/24/23 at 1207  Med List Status: <None>   Medication Order Taking? Sig Documenting Provider Last Dose Status Informant  aspirin  EC 81 MG tablet [id number] Yes Take 81 mg by mouth daily. [provider] Taking Active Self   atorvastatin  (LIPITOR) 40 MG tablet [id number] Yes TAKE 1 TABLET (40 MG TOTAL) BY MOUTH DAILY. Espinoza, Alejandra, DO Taking Active Self  busPIRone  (BUSPAR ) 15 MG tablet [id number] Yes Take 0.5 tablets (7.5 mg total) by mouth 2 (two) times daily. Espinoza, Alejandra, DO Taking Active Self  Carboxymethylcellul-Glycerin (LUBRICATING EYE DROPS OP) [id number] Yes Place 1 drop into both eyes daily as needed (dry eyes). [provider] Taking Active Self           Med Note Saverio Curling, CHERYL A   Fri Oct 22, 2022  2:20 PM) Taking as needed  carvedilol  (COREG ) 6.25 MG tablet [id number] Yes Take 2 tablets (12.5 mg total) by mouth 2 (two) times daily with a meal. Ernestina Headland, MD Taking Active   diclofenac  Sodium (VOLTAREN ) 1 % GEL [id number] No Apply 1 application topically 3 times daily as needed for pain  Patient not taking: Reported on 05/24/2023   Santa Cuba, MD Not Taking Consider Medication Status and Discontinue   docusate sodium  (COLACE) 100 MG capsule [id number] No Take 1 capsule (100 mg total) by mouth daily.  Patient not taking: Reported on 05/24/2023   Santa Cuba, MD Not Taking Consider Medication Status and Discontinue   DULoxetine  (CYMBALTA ) 30 MG capsule [id number] Yes Take 1 capsule (30 mg total) by mouth daily. Ellene Gustin, MD Taking Active Self  fentaNYL  (DURAGESIC ) 25 MCG/HR [id number] Yes Place 1 patch onto the skin every 3 (three) days. Pickenpack-Cousar, Giles Labrum, NP Taking Active   ferrous sulfate  325 (65 FE) MG tablet [id number] Yes Take 1 tablet (325 mg total) by mouth daily with breakfast. Santa Cuba, MD Taking Active   fluticasone  (FLONASE ) 50 MCG/ACT nasal spray [id number] Yes PLACE 1 SPRAY INTO BOTH NOSTRILS DAILY AS NEEDED (CONGESTION). Dema Filler, MD Taking Active   folic acid  (FOLVITE ) 1 MG tablet [id number] Yes TAKE 1 TABLET (1 MG TOTAL) BY MOUTH DAILY. Marlene Simas, MD Taking Active   gabapentin  (NEURONTIN ) 300 MG capsule [id number] Yes Take 2 capsules  (600 mg total) by mouth 3 (three) times daily. Pickenpack-Cousar, Giles Labrum, NP Taking Active   glucose blood (ACCU-CHEK AVIVA PLUS) test strip [id number] Yes USE AS INSTRUCTED EVERY DAY Azell Boll, MD Taking Active   hyoscyamine  (LEVSIN SL) 0.125 MG SL tablet [id number] Yes Place 1 tablet (0.125 mg total) under the tongue every 6 (six) hours as needed. For abdominal cramping Heilingoetter, Cassandra L, PA-C Taking Active Self  insulin  glargine (LANTUS ) 100 UNIT/ML Solostar Pen [id number] Yes Inject 24 Units into the skin every morning. Espinoza, Alejandra, DO Taking Active Self  Insulin  Pen Needle 32G X 4 MM MISC [id number] Yes 1 Needle by Does not apply route in the morning and at bedtime. Espinoza, Alejandra, DO Taking Active Self  ipratropium (ATROVENT  HFA) 17 MCG/ACT inhaler [id number] No Inhale 2 puffs into the lungs every 4 (four) hours as needed for wheezing (COPD).  Patient not taking: Reported on 05/24/2023   [provider] Not Taking Consider Medication Status and Discontinue Self  lactulose  (CHRONULAC ) 10 GM/15ML solution [id number] No Take 15 mLs (10 g total) by mouth daily as needed for moderate constipation or severe constipation.  Patient not taking: Reported on 05/24/2023   Shitarev, Dimitry,  MD Not Taking Consider Medication Status and Discontinue   lidocaine -prilocaine  (EMLA ) cream [id number] No APPLY 1 APPLICATION TOPICALLY AS NEEDED.  Patient not taking: Reported on 04/06/2023   Heilingoetter, Cassandra L, PA-C Not Taking Consider Medication Status and Discontinue Self           Med Note Saverio Curling, CHERYL A   Mon Oct 04, 2022  1:49 PM) For port access comfort  LORazepam  (ATIVAN ) 0.5 MG tablet [id number] Yes Take 1 tablet (0.5 mg total) by mouth every 8 (eight) hours as needed for anxiety (nausea). Pickenpack-Cousar, Giles Labrum, NP Taking Active   metoCLOPramide  (REGLAN ) 10 MG tablet [id number] No Take 1 tablet (10 mg total) by mouth every 8 (eight) hours as needed for nausea.   Patient not taking: Reported on 05/24/2023   Pickenpack-CousarGiles Labrum, NP Not Taking Consider Medication Status and Discontinue   mometasone -formoterol  (DULERA ) 100-5 MCG/ACT AERO [id number] Yes Inhale 2 puffs into the lungs in the morning and at bedtime. Santa Cuba, MD Taking Active   nystatin  (MYCOSTATIN ) 100000 UNIT/ML suspension [id number]  Take 5 mLs (500,000 Units total) by mouth 4 (four) times daily. Pickenpack-Cousar, Giles Labrum, NP  Active   ondansetron  (ZOFRAN -ODT) 8 MG disintegrating tablet [id number] Yes Take 1 tablet (8 mg total) by mouth every 8 (eight) hours as needed for nausea or vomiting. Heilingoetter, Cassandra L, PA-C Taking Active Self  oxyCODONE  (ROXICODONE ) 15 MG immediate release tablet [id number] Yes Take 1-2 tablets (15-30 mg total) by mouth every 4 (four) hours as needed for pain. Pickenpack-Cousar, Giles Labrum, NP Taking Active   pantoprazole  (PROTONIX ) 40 MG tablet [id number] Yes Take 1 tablet (40 mg total) by mouth 2 (two) times daily. Zehr, Jessica D, PA-C Taking Active Self  polyethylene glycol powder (GAVILAX) 17 GM/SCOOP powder [id number] Yes Take 17 g by mouth daily. Santa Cuba, MD Taking Active   spironolactone  (ALDACTONE ) 50 MG tablet [id number] Yes Take 1 tablet (50 mg total) by mouth at bedtime. Veronia Goon, DO Taking Active   torsemide  40 MG TABS [id number] Yes Take 100 mg by mouth daily. Omar Bibber, DO Taking Active   valACYclovir  (VALTREX ) 1000 MG tablet [id number] Yes Take for 3 days prn each outbreak.  Patient taking differently: Take 1,000 mg by mouth daily. Take for 3 days prn each outbreak.   Gabrielle Joiner, MD Taking Active Self            Recommendation:   Continue utilizing strategies discussed to assist with symptom management  Follow Up Plan:   Telephone follow-up in 1 month  Alease Hunter, LCSW [facility]  [facility], [facility] Clinical Social Worker Direct Dial: [phone]  Fax:  [phone] Website: [email or web address] 1:14 PM

## 2023-05-24 NOTE — Patient Instructions (Signed)
 Visit Information  Cassidy [name] was given information about Medicaid Managed Care team care coordination services as a part of their [facility] Community Plan Medicaid benefit. Cassidy [name] verbally consented to engagement with the [facility] Managed Care team.   If you are experiencing a medical emergency, please call 911 or report to your local emergency department or urgent care.   If you have a non-emergency medical problem during routine business hours, please contact your provider's office and ask to speak with a nurse.   For questions related to your [facility], please call: [phone] or visit the homepage here: [email or web address]  If you would like to schedule transportation through your [facility], please call the following number at least 2 days in advance of your appointment: [phone]   Rides for urgent appointments can also be made after hours by calling Member Services.  Call the Behavioral Health Crisis Line at [phone]) [phone], at any time, 24 hours a day, 7 days a week. If you are in danger or need immediate medical attention call 911.  If you would like help to quit smoking, call 1-800-QUIT-NOW ((930) 525-0010) OR Espaol: 1-855-Djelo-Ya (1-324-401-0272) o para ms informacin haga clic aqu or Text READY to 536-644 to register via text  Cassidy [name] - following are the goals we discussed in your visit today:   Goals Addressed             This Visit's Progress    LCSW VBCI Social Work Care Plan   On track    Problems:   Disease Management support and education needs related to Anxiety with Excessive Worry,, Bipolar Disorder, Depression: depressed mood, and PTSD  CSW Clinical Goal(s):   Over the next 90 days the Patient will attend all scheduled medical appointments as evidenced by patient report and care team review of appointment completion in  electronic MEDICAL RECORD NUMBER  demonstrate a reduction in symptoms related to Anxiety with Panic Symptoms, Bipolar Disorder Depression: depressed mood PTSD .  Interventions:  Mental Health:  Evaluation of current treatment plan related to Anxiety with Panic Symptoms,, Bipolar Disorder, Depression: depressed mood, and PTSD Active listening / Reflection utilized Emotional Support Provided Mindfulness or Relaxation training provided Motivational Interviewing employed Provided general psycho-education for mental health needs Solution-Focued Strategies employed:  Patient Goals/Self-Care Activities:  Increase coping skills, healthy habits, and self-management skills  Follow up with [employer]  Plan:   Telephone follow up appointment with care management team member scheduled for:  2-4 weeks        Please see education materials related to what we discussed provided by MyChart link.  Patient verbalizes understanding of instructions and care plan provided today and agrees to view in MyChart. Active MyChart status and patient understanding of how to access instructions and care plan via MyChart confirmed with patient.     Licensed Visual merchandiser will f/up in a month  Cassidy Hunter, LCSW [facility]  [facility], [facility] Clinical Social Worker Direct Dial: [phone]  Fax: [phone] Website: [email or web address] 1:14 PM   Following is a copy of your plan of care:  There are no care plans that you recently modified to display for this patient.

## 2023-05-30 ENCOUNTER — Telehealth: Payer: Self-pay | Admitting: Medical Oncology

## 2023-05-30 NOTE — Telephone Encounter (Signed)
 The patient requires peritoneal supplies to drain her catheter. She received one box of supplies from [facility] on 02/16/23. A second box of supplies arrived later via mail, but the patient is unsure who ordered the second box. I informed her that the catheter was ordered by Dr. Dawn Eth.  I recommended that she reach out to her Medicaid nurse for assistance in obtaining the necessary supplies, as she cannot afford to pay for them out of pocket.

## 2023-05-31 ENCOUNTER — Other Ambulatory Visit: Payer: Self-pay

## 2023-05-31 NOTE — Patient Instructions (Signed)

## 2023-05-31 NOTE — Patient Outreach (Signed)
 Complex Care Management   Visit Note  05/31/2023  Name:  Cassidy [name] MRN: [id number] DOB: [birth year]  Situation: Referral received for Complex Care Management related to SDOH Barriers:  Housing relocate I obtained verbal consent from Patient.  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  s/p PCTA to dRCA and ostial RPAV-PLA vessel + PDA branch (05/2012)  b. [place]  s/p DES to mLAD - Resolute DES 3.0 x 22 (3.8mm -->3.3 mm)   Bipolar depression (HCC)    Breast abscess    a. right side.    COPD (chronic obstructive pulmonary disease) (HCC)    Depression    Diabetic peripheral neuropathy (HCC)    Fibromyalgia    Genital warts    GERD (gastroesophageal reflux disease)    History of hiatal hernia    HLD (hyperlipidemia)    Hypertension    Lung cancer (HCC)    Pain in limb    a. LE VENOUS DUPLEX, 02/05/2009 - no evidence of deep vein thrombosis, Baker's cyst   Pneumonia    PTSD (post-traumatic stress disorder)    Sickle cell trait (HCC)    Tobacco abuse    Tooth caries    Type II diabetes mellitus (HCC)     Assessment:  Patient reports the leasing office completed several repairs and is waiting for Section 8 to review.  The leasing office has agreed to release patient from her lease once she is ready to relocate.  Patient contacted Section 8 case manager Ms. Nolon Baxter to request a follow up.  Patient will contact [facility] once she is able to resolve the housing issue.  Patient will start with [employer] and will end her [facility] with the [facility].  SW refers patient to [employer], [employer], [employer] and [place]. [facility] for financial assistance if needed toward the move.  Patient does not feel a follow up is needed.   SDOH  Interventions    Flowsheet Row Care Coordination from 04/26/2023 in [place] POPULATION HEALTH DEPARTMENT Care Coordination from 04/12/2023 in [facility] Care Coordination Care Coordination from 04/06/2023 in [facility] Care Coordination Care Coordination from 04/05/2023 in [facility] Care Coordination Clinical Support from 09/08/2021 in [facility] - A Dept Of [place]. [facility]  SDOH Interventions       Food Insecurity Interventions Intervention Not Indicated Intervention Not Indicated  [Foodstamps] Intervention Not Indicated Intervention Not Indicated --  Housing Interventions -- Intervention Not Indicated  [Section 8] Intervention Not Indicated Intervention Not Indicated  [Pt is interested in moving to new housing - will send housing resources.] --  Transportation Interventions Intervention Not Indicated, Patient Resources (Friends/Family) Intervention Not Indicated  [Virtual visits and family/friends help with errands] Intervention Not Indicated Intervention Not Indicated --  Utilities Interventions Intervention Not Indicated Intervention Not Indicated Intervention Not Indicated Intervention Not Indicated --  Financial Strain Interventions -- -- -- -- Artist         Recommendation:   No medical recommendations at this time.  Follow Up Plan:   Patient has met all care management goals. Care Management case will be closed. Patient has been provided contact  information should new needs arise.   Dallis Dues, BSW [place]  [facility], [facility] Social Worker Direct Dial: [phone]  Fax: [phone] Website: [email or web address]

## 2023-06-01 ENCOUNTER — Inpatient Hospital Stay: Admitting: Nurse Practitioner

## 2023-06-07 ENCOUNTER — Other Ambulatory Visit: Payer: Self-pay | Admitting: Nurse Practitioner

## 2023-06-07 DIAGNOSIS — Z515 Encounter for palliative care: Secondary | ICD-10-CM

## 2023-06-07 DIAGNOSIS — G893 Neoplasm related pain (acute) (chronic): Secondary | ICD-10-CM

## 2023-06-07 DIAGNOSIS — C3491 Malignant neoplasm of unspecified part of right bronchus or lung: Secondary | ICD-10-CM

## 2023-06-08 ENCOUNTER — Encounter: Payer: Self-pay | Admitting: Nurse Practitioner

## 2023-06-08 ENCOUNTER — Encounter: Payer: Self-pay | Admitting: Internal Medicine

## 2023-06-08 ENCOUNTER — Inpatient Hospital Stay: Attending: Internal Medicine | Admitting: Nurse Practitioner

## 2023-06-08 DIAGNOSIS — G893 Neoplasm related pain (acute) (chronic): Secondary | ICD-10-CM

## 2023-06-08 DIAGNOSIS — M792 Neuralgia and neuritis, unspecified: Secondary | ICD-10-CM | POA: Diagnosis not present

## 2023-06-08 DIAGNOSIS — Z515 Encounter for palliative care: Secondary | ICD-10-CM

## 2023-06-08 DIAGNOSIS — C3491 Malignant neoplasm of unspecified part of right bronchus or lung: Secondary | ICD-10-CM | POA: Diagnosis not present

## 2023-06-08 DIAGNOSIS — Z7189 Other specified counseling: Secondary | ICD-10-CM | POA: Diagnosis not present

## 2023-06-08 DIAGNOSIS — R630 Anorexia: Secondary | ICD-10-CM | POA: Diagnosis not present

## 2023-06-08 DIAGNOSIS — R530 Neoplastic (malignant) related fatigue: Secondary | ICD-10-CM

## 2023-06-08 DIAGNOSIS — K5903 Drug induced constipation: Secondary | ICD-10-CM | POA: Diagnosis not present

## 2023-06-08 MED ORDER — OXYCODONE HCL 15 MG PO TABS: 15.0000 mg | ORAL_TABLET | ORAL | 0 refills | Status: DC | PRN

## 2023-06-08 MED ORDER — FENTANYL 25 MCG/HR TD PT72: 1.0000 | MEDICATED_PATCH | TRANSDERMAL | 0 refills | Status: DC

## 2023-06-08 MED ORDER — GABAPENTIN 300 MG PO CAPS: 600.0000 mg | ORAL_CAPSULE | Freq: Three times a day (TID) | ORAL | 2 refills | Status: DC

## 2023-06-08 NOTE — Progress Notes (Signed)
 Palliative Medicine [facility]  Telephone:([phone]) [phone] Fax:(336) [phone]   Name: Cassidy [name] Date: 06/08/2023 MRN: [id number]  DOB: [birth year]  Patient Care Team: Ernestina Headland, MD as PCP - General (Family Medicine) O'Neal, Cathay Clonts, MD as PCP - Cardiology (Cardiology) Enid Harry, MD as Consulting Physician (General Surgery) Pickenpack-Cousar, Giles Labrum, NP as Nurse Practitioner (Hospice and Palliative Medicine) Randye Buttner, RN as Registered Nurse [facility] Clemons, Colette Davies Care Management Lewis, Jasmine D, LCSW as VBCI Care Management (Licensed Clinical Social Worker)   I connected with Cassidy [name] on 06/08/23 at 10:30 AM EDT by Telephone and verified that I am speaking with the correct person using two identifiers.   I discussed the limitations, risks, security and privacy concerns of performing an evaluation and management service by telemedicine and the availability of in-person appointments. I also discussed with the patient that there may be a patient responsible charge related to this service. The patient expressed understanding and agreed to proceed.   Other persons participating in the visit and their role in the encounter: N/A   Patient's location: Home   Provider's location: [facility]   INTERVAL HISTORY: Cassidy [name] is a 58 y.o. 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  Palliative ask to see for symptom management and goals of care.   SOCIAL HISTORY:     reports that she has quit smoking. Her smoking use included cigarettes. She started smoking about 44 years ago. She has a 22.2 pack-year smoking history. She has never used smokeless tobacco. She reports that she does not currently use drugs. She reports that she does not  drink alcohol .  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  s/p PCTA to dRCA and ostial RPAV-PLA vessel + PDA branch (05/2012)  b. [place]  s/p DES to mLAD - Resolute DES 3.0 x 22 (3.49mm -->3.3 mm)   Bipolar depression (HCC)    Breast abscess    a. right side.    COPD (chronic obstructive pulmonary disease) (HCC)    Depression    Diabetic peripheral neuropathy (HCC)    Fibromyalgia    Genital warts    GERD (gastroesophageal reflux disease)    History of hiatal hernia    HLD (hyperlipidemia)    Hypertension    Lung cancer (HCC)    Pain in limb    a. LE VENOUS DUPLEX, 02/05/2009 - no evidence of deep vein thrombosis, Baker's cyst   Pneumonia    PTSD (post-traumatic stress disorder)    Sickle cell trait (HCC)    Tobacco abuse    Tooth caries    Type II diabetes mellitus (HCC)     ALLERGIES:  is allergic to amoxil  [amoxicillin ], chantix [varenicline], suboxone [buprenorphine hcl-naloxone hcl], and emend [fosaprepitant  dimeglumine].  MEDICATIONS:  Current Outpatient Medications  Medication Sig Dispense Refill   aspirin  EC 81 MG tablet Take 81 mg by mouth daily.     atorvastatin  (LIPITOR) 40 MG tablet TAKE 1 TABLET (40 MG TOTAL) BY MOUTH DAILY. 90 tablet 3   busPIRone  (  BUSPAR ) 15 MG tablet Take 0.5 tablets (7.5 mg total) by mouth 2 (two) times daily. 60 tablet 3   Carboxymethylcellul-Glycerin (LUBRICATING EYE DROPS OP) Place 1 drop into both eyes daily as needed (dry eyes).     carvedilol  (COREG ) 6.25 MG tablet Take 2 tablets (12.5 mg total) by mouth 2 (two) times daily with a meal. 60 tablet 0   diclofenac  Sodium (VOLTAREN ) 1 % GEL Apply 1 application topically 3 times daily as needed for pain 200 g 1   docusate  sodium (COLACE) 100 MG capsule Take 1 capsule (100 mg total) by mouth daily. 10 capsule 0   DULoxetine  (CYMBALTA ) 30 MG capsule Take 1 capsule (30 mg total) by mouth daily. 30 capsule 5   ferrous sulfate  325 (65 FE) MG tablet Take 1 tablet (325 mg total) by mouth daily with breakfast. 30 tablet 2   fluticasone  (FLONASE ) 50 MCG/ACT nasal spray PLACE 1 SPRAY INTO BOTH NOSTRILS DAILY AS NEEDED (CONGESTION). 16 g 1   folic acid  (FOLVITE ) 1 MG tablet TAKE 1 TABLET (1 MG TOTAL) BY MOUTH DAILY. 30 tablet 3   glucose blood (ACCU-CHEK AVIVA PLUS) test strip USE AS INSTRUCTED EVERY DAY 50 each 1   hyoscyamine  (LEVSIN SL) 0.125 MG SL tablet Place 1 tablet (0.125 mg total) under the tongue every 6 (six) hours as needed. For abdominal cramping 30 tablet 0   insulin  glargine (LANTUS ) 100 UNIT/ML Solostar Pen Inject 24 Units into the skin every morning. 3 mL 11   Insulin  Pen Needle 32G X 4 MM MISC 1 Needle by Does not apply route in the morning and at bedtime. 120 each 2   ipratropium (ATROVENT  HFA) 17 MCG/ACT inhaler Inhale 2 puffs into the lungs every 4 (four) hours as needed for wheezing (COPD).     lactulose  (CHRONULAC ) 10 GM/15ML solution Take 15 mLs (10 g total) by mouth daily as needed for moderate constipation or severe constipation. 236 mL 0   lidocaine -prilocaine  (EMLA ) cream APPLY 1 APPLICATION TOPICALLY AS NEEDED. 30 g 2   LORazepam  (ATIVAN ) 0.5 MG tablet Take 1 tablet (0.5 mg total) by mouth every 8 (eight) hours as needed for anxiety (nausea). 30 tablet 0   metoCLOPramide  (REGLAN ) 10 MG tablet Take 1 tablet (10 mg total) by mouth every 8 (eight) hours as needed for nausea. 60 tablet 2   mometasone -formoterol  (DULERA ) 100-5 MCG/ACT AERO Inhale 2 puffs into the lungs in the morning and at bedtime. 13 g 3   nystatin  (MYCOSTATIN ) 100000 UNIT/ML suspension Take 5 mLs (500,000 Units total) by mouth 4 (four) times daily. 473 mL 2   ondansetron  (ZOFRAN -ODT) 8 MG disintegrating tablet Take 1 tablet (8 mg  total) by mouth every 8 (eight) hours as needed for nausea or vomiting. 30 tablet 2   pantoprazole  (PROTONIX ) 40 MG tablet Take 1 tablet (40 mg total) by mouth 2 (two) times daily. 60 tablet 5   polyethylene glycol powder (GAVILAX) 17 GM/SCOOP powder Take 17 g by mouth daily. 850 g 1   spironolactone  (ALDACTONE ) 50 MG tablet Take 1 tablet (50 mg total) by mouth at bedtime. 90 tablet 0   torsemide  40 MG TABS Take 100 mg by mouth daily. 30 tablet 0   valACYclovir  (VALTREX ) 1000 MG tablet Take for 3 days prn each outbreak. (Patient taking differently: Take 1,000 mg by mouth daily. Take for 3 days prn each outbreak.) 30 tablet 11   fentaNYL  (DURAGESIC ) 25 MCG/HR Place 1 patch onto the skin every 3 (three) days.  10 patch 0   gabapentin  (NEURONTIN ) 300 MG capsule Take 2 capsules (600 mg total) by mouth 3 (three) times daily. 180 capsule 2   oxyCODONE  (ROXICODONE ) 15 MG immediate release tablet Take 1-2 tablets (15-30 mg total) by mouth every 4 (four) hours as needed for pain. 120 tablet 0   No current facility-administered medications for this visit.    VITAL SIGNS: LMP 06/12/2016 (Approximate)  There were no vitals filed for this visit.  Estimated body mass index is 30.12 kg/m as calculated from the following:   Height as of 05/04/23: 5\' 7"  (1.702 m).   Weight as of 05/04/23: 192 lb 4.8 oz (87.2 kg).   PERFORMANCE STATUS (ECOG) : 3 - Symptomatic, >50% confined to bed  IMPRESSION: Discussed the use of AI scribe software for clinical note transcription with the patient, who gave verbal consent to proceed.  History of Present Illness Cassidy [name] is a 58 year old female with stage four lung cancer who I connected by phone with for follow-up. Patient referred to [facility] hospice over 2 weeks ago however patient reports she has not been admitted under their services as of yet. Scheduled to have home consultation this afternoon.   She reports issues with constipation, although she is having bowel  movements at least once a day. Gavilax is used one or two times a week to manage this.  Did not have concerns of nausea, vomiting, or diarrhea.  Reports her appetite fluctuates.  Some days are better than others.  No significant weight loss per patient.  Ongoing fatigue.  Tries to remain as active as she can around the home using walker for ambulation.  Patient reports she is not leaving the house much due to significant weakness and difficulty getting in and out of the car.  Reports her family comes and run errands for her.  She drains her Pleurx catheter twice a week, removing approximately 1000 mL each time, without issues. She is taking torsemide  for edema, which effectively reduces swelling.  She experiences pain related to a bump on her hip, described as a 'patient skin protectant pad' placed on her in the hospital. Difficulty sleeping is noted due to a small knot forming on her right side, anticipated to become like the larger knot on her left side. Sleeping on her back exacerbates sciatic pain.   Patient reports current pain regimen is managing her pain overall. She is taking oxycodone  1-2 tablets every four hours as needed and uses a 25 mcg fentanyl  patch. Gabapentin  600 mg 3 times daily.  Tolerating regimen without difficulty.  No adjustments at this time.  Patient understands when she is enrolled under hospice they will assist in managing her symptoms in addition to medication refills.  Goals of care Cassidy [name] continues to remain hopeful for her stability. She knows at some point she will face end-of-life however feels at this time she is doing "better" than previous weeks. She wishes to continue taking life one day at a time managing her symptoms aggressively.   04/22/23: We discussed the patient's current illness and what it means in the larger context of their on-going co-morbidities. Natural disease trajectory and expectations were discussed.  Cassidy [name] is realistic in her understanding  of her current health state and ongoing decline.  She is emotional expressing her decreased appetite, muscle wasting, weakness, and life in general.  Emotional support provided.  I empathetically approach discussions regarding end-of-life including expectations in the home versus hospital versus inpatient unit.  I had a direct and honest discussion with Ms. Territo expressing my concerns for her decline in her quality of life and unfortunately it appears that she is approaching end-of-life.  Patient is emotional acknowledging her agreement and observation as well.  I strongly recommended she consider hospice to allow her additional support and comfort for what time she has left.  She expresses her desire not to disconnect however I advised patient myself and team can continue to be involved in her care in addition with hospice and we will gladly serve as her attending.  She verbalized understanding expressing if this was the case she would then be a more agreement also sharing she wanted to think about it a little further and discussed with her son.  Education provided on hospice's goals and philosophy of care in addition to what symptom management would look like for patient.  Discussions regarding healthcare limitations which included DNR/DNI, no artificial feeding tubes, and the desire for natural death.  Patient states her cell phone is not is up-to-date and she is not technology savvy otherwise we will complete electronic MOST form.  I strongly recommended to Ms. Malson to proceed with emergency evaluation with understanding of limitations in her care at the time of workup.  She wishes to wait till Friday before she seeks medical assistance again referring to getting her house in order.  I discussed palliative being involved if she is admitted and also having the hospice liaison engage in discussions with patient and family.  She verbalized understanding expressing she would be open to this per  recommendations.  04/06/23: We discussed her overall condition and ongoing health decline. She continues to take things one day at a time but speaks to her awareness of decline. She is emotional expressing the moments she is spending with her family.    She recently enjoyed a trip to the beach with family, which she found uplifting despite not being able to use the jacuzzi due to [facility] drain. She is emotional sharing this was one of her best trips and spending time with her son who facilitated the trip. Shares she knows "what his intentions" were with organizing the trip and she is thankful for those moments. She expresses gratitude for the support and advocacy from her brother's girlfriend, who is helping her access resources and potentially relocate from her current apartment. Her cat is being cared for by a neighbor during her absence.   Ms. Teasdale is aware that she is a candidate for in-home hospice and palliative support however remains reluctant expressing no desire to disconnect from her current medical team. Education provided on the supportive services they can offer. She is not ready to pursue.   02/22/23: Cassidy [name] continues to be clear in her expressed goals to treat the treatable while aggressively managing her symptoms. She desires not to be in a suffering state as her quality of life is important to her. She is not ready to consider hospice care at this time.    01/19/23: I empathetically approached discussions regarding goals of care, quality of life, and healthcare limitations. Although patient's recent scans do not show significant cancer progression as discussed with Cassie, PA Cassidy [name]'s quality of life is clearly poor and symptom burden is high. She is realistic in her understanding of her current cancer state in addition to other co-morbidities. Cassidy [name] is clear in her expressed wishes that her quality of life is most important to her and not being in a suffering state. She  is tearful  sharing she knows that time is limited. The patient has began preparing her son for this however he is not accepting at this time as the only child. Cassidy [name] has spoken with this girlfriend who acknowledges her decline and is assisting in preparing patient's son for the inevitable. Emotional support provided.    Cassidy [name] states she has no desire for her life to be prolonged if there is no quality. We discussed her full code status with consideration of her current illness. She shares desires for a natural death and her life to not be prolonged by life-sustaining measures including CPR or intubation. Education provided on DNR/DNI. She expressed understanding and agreement with the concept due to her deteriorating health status. She also would not want her family to struggle with such decisions. MOST form introduced and education provided on documents. The patient would like to take form home and review hoping to gain support from her son. She is interested in completing at follow-up visit. Would not wish for artificial nutrition.   I discussed the importance of continued conversation with family and their medical providers regarding overall plan of care and treatment options, ensuring decisions are within the context of the patients values and GOCs. Assessment & Plan Pain management Pain management is a priority. Reports hip pain. Overall pain is well managed on current regimen.  - Continue oxycodone  1-2 tablets every 4 hours as needed for pain. - Continue fentanyl  patch 25 mcg. - Pending results from hip imaging.   Peripheral neuropathy Peripheral neuropathy with tingling and burning in feet. Current gabapentin  dosage has limited efficacy. - Prescribe gabapentin  600 mg three times daily. - Continue duloxetine  30 mg daily. - Send prescription to [facility] with two refills.  Bilateral lower extremity edema Bilateral lower extremity edema, possibly related to medication or underlying conditions. -  Continue with torsemide  as prescribed.   Pleurx catheter Abdominal ascites managed with Pleurx catheter, draining approximately 1000 mL twice weekly without complications.  Constipation Intermittent constipation managed with Gavilax. Reports bowel movements are not twice daily but is managing to go.  Goals of Care Discussed challenges with mobility and independence. She expressed concerns about reliance on others for assistance and financial burden of purchasing supplies.Discussed hospice care for symptom management and quality of life improvement. Addressed concerns about treatment cessation and port use.  Strongly encouraged patient to consider enrolling in hospice for what time she has left given poor overall prognosis and quality of life.  We had a long extensive discussion regarding her wishes.  I empathetically expressed directly to her my concerns.  Patient expressed she is much more open to consider hospice at this time. - Ensure understanding that hospice can manage symptoms and the port can remain for medication administration. -Patient is aware if she enrolls in hospice and would like that we generally allow their team to direct care however if the barrier is fear of loss of relationship I will happily service her attending under hospice if needed. - Ensure [facility] provides necessary supplies to alleviate financial burden. - Maintain regular communication to address any changes in care needs.  Patient expressed understanding and was in agreement with this plan. She also understands that She can call the clinic at any time with any questions, concerns, or complaints.   Any controlled substances utilized were prescribed in the context of palliative care. PDMP has been reviewed.   Visit consisted of counseling and education dealing with the complex and emotionally intense issues of symptom management  and palliative care in the setting of serious and potentially life-threatening  illness.  Dellia Ferguson, AGPCNP-BC  Palliative Medicine Team/[facility]

## 2023-06-09 ENCOUNTER — Other Ambulatory Visit: Payer: Self-pay

## 2023-06-09 NOTE — Patient Outreach (Signed)
 Complex Care Management   Visit Note  06/09/2023  Name:  Cassidy [name] MRN: [id number] DOB: Apr 06, 1965  Situation: Referral received for Complex Care Management related to Anal cancer I obtained verbal consent from Caregiver.  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  s/p PCTA to dRCA and ostial RPAV-PLA vessel + PDA branch (05/2012)  b. [place]  s/p DES to mLAD - Resolute DES 3.0 x 22 (3.55mm -->3.3 mm)   Bipolar depression (HCC)    Breast abscess    a. right side.    COPD (chronic obstructive pulmonary disease) (HCC)    Depression    Diabetic peripheral neuropathy (HCC)    Fibromyalgia    Genital warts    GERD (gastroesophageal reflux disease)    History of hiatal hernia    HLD (hyperlipidemia)    Hypertension    Lung cancer (HCC)    Pain in limb    a. LE VENOUS DUPLEX, 02/05/2009 - no evidence of deep vein thrombosis, Baker's cyst   Pneumonia    PTSD (post-traumatic stress disorder)    Sickle cell trait (HCC)    Tobacco abuse    Tooth caries    Type II diabetes mellitus (HCC)     Assessment:  In my recent conversation with Cassidy [name], I noticed a significant improvement in her overall demeanor compared to our initial interaction. She was able to clearly articulate her needs and expressed gratitude for the assistance provided by my colleagues and me.   Cassidy [name] shared that she has chosen to pursue hospice care and had her first visit yesterday. During that visit, she received the necessary supplies. She also mentioned that she now has a new medical alert necklace that belonged to her late aunt, which she has in a box brand new, she was given after her aunt passed away. She is planning to check if she can afford the service  associated with the necklace.  I conveyed my satisfaction regarding her decision to seek the assistance she needs. Now that she is under hospice care, I informed her that this would be my final call.   Patient Reported Symptoms:  Cognitive Cognitive Status: Able to follow simple commands, Alert and oriented to person, place, and time, Insightful and able to interpret abstract concepts, Normal speech and language skills      Neurological Neurological Review of Symptoms: Not assessed    HEENT HEENT Symptoms Reported: Not assessed      Cardiovascular Cardiovascular Symptoms Reported: Not assessed    Respiratory Respiratory Symptoms Reported: Not assesed    Endocrine Patient reports the following symptoms related to hypoglycemia or hyperglycemia : Not assessed    Gastrointestinal Gastrointestinal Symptoms Reported: Not assessed      Genitourinary Genitourinary Symptoms Reported: Not assessed    Integumentary Integumentary Symptoms Reported: Not assessed    Musculoskeletal Musculoskelatal Symptoms Reviewed: Not assessed        Psychosocial Psychosocial Symptoms Reported: Not assessed            03/29/2023    2:30 PM  Depression screen PHQ 2/9  Decreased Interest 1  Down, Depressed, Hopeless 1  PHQ - 2 Score 2  Altered sleeping 1  Tired, decreased energy 1  Change in appetite 0  Feeling bad or failure about yourself  0  Trouble concentrating 0  Moving slowly or fidgety/restless 0  Suicidal thoughts 0  PHQ-9 Score 4  Difficult doing work/chores Extremely dIfficult    There were no vitals filed for this visit.  Medications Reviewed Today     Reviewed by Cassidy Leber, RN (Registered Nurse) on 06/09/23 at 1514  Med List Status: <None>   Medication Order Taking? Sig Documenting Provider Last Dose Status Informant  aspirin  EC 81 MG tablet [id number] Yes Take 81 mg by mouth daily. [provider] Taking Active Self  atorvastatin  (LIPITOR) 40 MG tablet  [id number] Yes TAKE 1 TABLET (40 MG TOTAL) BY MOUTH DAILY. Espinoza, Alejandra, DO Taking Active Self  busPIRone  (BUSPAR ) 15 MG tablet [id number] Yes Take 0.5 tablets (7.5 mg total) by mouth 2 (two) times daily. Espinoza, Alejandra, DO Taking Active Self  Carboxymethylcellul-Glycerin (LUBRICATING EYE DROPS OP) [id number] Yes Place 1 drop into both eyes daily as needed (dry eyes). [provider] Taking Active Self           Med Note Saverio Curling, CHERYL A   Fri Oct 22, 2022  2:20 PM) Taking as needed  carvedilol  (COREG ) 6.25 MG tablet [id number] Yes Take 2 tablets (12.5 mg total) by mouth 2 (two) times daily with a meal. Ernestina Headland, MD Taking Active   diclofenac  Sodium (VOLTAREN ) 1 % GEL [id number] Yes Apply 1 application topically 3 times daily as needed for pain Santa Cuba, MD Taking Active   docusate sodium  (COLACE) 100 MG capsule [id number] Yes Take 1 capsule (100 mg total) by mouth daily. Santa Cuba, MD Taking Active   DULoxetine  (CYMBALTA ) 30 MG capsule [id number] Yes Take 1 capsule (30 mg total) by mouth daily. Ellene Gustin, MD Taking Active Self  fentaNYL  (DURAGESIC ) 25 MCG/HR [id number] Yes Place 1 patch onto the skin every 3 (three) days. Pickenpack-Cousar, Giles Labrum, NP Taking Active   ferrous sulfate  325 (65 FE) MG tablet [id number] Yes Take 1 tablet (325 mg total) by mouth daily with breakfast. Santa Cuba, MD Taking Active   fluticasone  (FLONASE ) 50 MCG/ACT nasal spray [id number] Yes PLACE 1 SPRAY INTO BOTH NOSTRILS DAILY AS NEEDED (CONGESTION). Dema Filler, MD Taking Active   folic acid  (FOLVITE ) 1 MG tablet [id number] Yes TAKE 1 TABLET (1 MG TOTAL) BY MOUTH DAILY. Marlene Simas, MD Taking Active   gabapentin  (NEURONTIN ) 300 MG capsule [id number] Yes Take 2 capsules (600 mg total) by mouth 3 (three) times daily. Pickenpack-Cousar, Giles Labrum, NP Taking Active   glucose blood (ACCU-CHEK AVIVA PLUS) test strip [id number] Yes USE AS INSTRUCTED EVERY DAY Azell Boll, MD Taking  Active   hyoscyamine  (LEVSIN SL) 0.125 MG SL tablet [id number] Yes Place 1 tablet (0.125 mg total) under the tongue every 6 (six) hours as needed. For abdominal cramping Heilingoetter, Cassandra L, PA-C Taking Active Self  insulin  glargine (LANTUS ) 100 UNIT/ML Solostar Pen [id number] Yes Inject 24 Units into the skin every morning. Espinoza, Alejandra, DO Taking Active Self  Insulin  Pen Needle 32G X 4 MM MISC [id number] Yes 1 Needle by Does not apply route in the morning and at bedtime. Espinoza, Alejandra, DO Taking Active Self  ipratropium (ATROVENT  HFA) 17 MCG/ACT inhaler [id number] Yes Inhale 2 puffs into the lungs every 4 (four) hours as needed for wheezing (COPD). [provider] Taking Active Self  lactulose  (CHRONULAC ) 10 GM/15ML solution [id number] Yes Take 15 mLs (10 g total) by mouth daily as needed for moderate constipation  or severe constipation. Shitarev, Dimitry, MD Taking Active   lidocaine -prilocaine  (EMLA ) cream [id number] Yes APPLY 1 APPLICATION TOPICALLY AS NEEDED. Heilingoetter, Cassandra L, PA-C Taking Active Self           Med Note Saverio Curling, CHERYL A   Mon Oct 04, 2022  1:49 PM) For port access comfort  LORazepam  (ATIVAN ) 0.5 MG tablet [id number] Yes Take 1 tablet (0.5 mg total) by mouth every 8 (eight) hours as needed for anxiety (nausea). Pickenpack-Cousar, Giles Labrum, NP Taking Active   metoCLOPramide  (REGLAN ) 10 MG tablet [id number] Yes Take 1 tablet (10 mg total) by mouth every 8 (eight) hours as needed for nausea. Pickenpack-Cousar, Giles Labrum, NP Taking Active   mometasone -formoterol  (DULERA ) 100-5 MCG/ACT AERO [id number] Yes Inhale 2 puffs into the lungs in the morning and at bedtime. Santa Cuba, MD Taking Active   nystatin  (MYCOSTATIN ) 100000 UNIT/ML suspension [id number] Yes Take 5 mLs (500,000 Units total) by mouth 4 (four) times daily. Pickenpack-Cousar, Giles Labrum, NP Taking Active   ondansetron  (ZOFRAN -ODT) 8 MG disintegrating tablet [id number] Yes Take 1 tablet (8 mg  total) by mouth every 8 (eight) hours as needed for nausea or vomiting. Heilingoetter, Cassandra L, PA-C Taking Active Self  oxyCODONE  (ROXICODONE ) 15 MG immediate release tablet [id number] Yes Take 1-2 tablets (15-30 mg total) by mouth every 4 (four) hours as needed for pain. Pickenpack-Cousar, Giles Labrum, NP Taking Active   pantoprazole  (PROTONIX ) 40 MG tablet [id number] Yes Take 1 tablet (40 mg total) by mouth 2 (two) times daily. Zehr, Jessica D, PA-C Taking Active Self  polyethylene glycol powder (GAVILAX) 17 GM/SCOOP powder [id number] Yes Take 17 g by mouth daily. Santa Cuba, MD Taking Active   spironolactone  (ALDACTONE ) 50 MG tablet [id number] Yes Take 1 tablet (50 mg total) by mouth at bedtime. Veronia Goon, DO Taking Active   torsemide  40 MG TABS [id number] Yes Take 100 mg by mouth daily. Omar Bibber, DO Taking Active   valACYclovir  (VALTREX ) 1000 MG tablet [id number] Yes Take for 3 days prn each outbreak.  Patient taking differently: Take 1,000 mg by mouth daily. Take for 3 days prn each outbreak.   Gabrielle Joiner, MD Taking Active Self            Recommendation:   Specialty provider follow-up Hospice  Follow Up Plan:   Closing From:  Complex Care Management  Cassidy Leber RN, BSN, [facility]  [facility], [facility]  Care Coordinator Phone: [phone]

## 2023-06-09 NOTE — Patient Instructions (Signed)
 Visit Information  Cassidy [name] was given information about Medicaid Managed Care team care coordination services as a part of their [facility] Community Plan Medicaid benefit. Cassidy [name] verbally consented to engagement with the [facility] Managed Care team.   If you are experiencing a medical emergency, please call 911 or report to your local emergency department or urgent care.   If you have a non-emergency medical problem during routine business hours, please contact your provider's office and ask to speak with a nurse.   For questions related to your [facility], please call: [phone] or visit the homepage here: [email or web address]  If you would like to schedule transportation through your [facility], please call the following number at least 2 days in advance of your appointment: [phone]   Rides for urgent appointments can also be made after hours by calling Member Services.  Call the Behavioral Health Crisis Line at [phone], at any time, 24 hours a day, 7 days a week. If you are in danger or need immediate medical attention call 911.  If you would like help to quit smoking, call 1-800-QUIT-NOW (516-825-4590) OR Espaol: 1-855-Djelo-Ya (3-664-403-4742) o para ms informacin haga clic aqu or Text READY to 595-638 to register via text  [name] - following are the goals we discussed in your visit today:   Goals Addressed             This Visit's Progress    COMPLETED: Patient Stated-She needs incotinence  supplies , medical alert bracelet housing and [employer] worker       Care Coordination Interventions:  Active listening / Reflection utilized  Emotional Support Provided Problem Solving /Task Center strategies reviewed Collaborated with Cassidy [name] Medicaid [facility] PCP a message           Patient verbalizes understanding of instructions and care plan  provided today and agrees to view in MyChart. Active MyChart status and patient understanding of how to access instructions and care plan via MyChart confirmed with patient.     Closing From:  Complex Care Management  Augustin Leber RN, BSN, [facility]   [facility], [facility]  Care Coordinator Phone: [phone]      Following is a copy of your plan of care:  There are no care plans that you recently modified to display for this patient.

## 2023-06-16 NOTE — Progress Notes (Deleted)
 NEUROLOGY FOLLOW UP OFFICE NOTE  Cassidy [name] [id number]  Subjective:  Cassidy [name] is a 58 y.o. 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  To briefly review: 04/07/22: Patient had a triple bypass surgery in 06/2021. She was having ongoing chest pain. She was later found to have stage IV lung cancer. She has done radiation and chemotherapy.   She had a history of diabetic neuropathy prior to chemotherapy. Symptoms have worsened with chemotherapy. She describes burning pain into both legs up to her knees. She has burning in her fingers as well. She thinks the left leg may be more weak. She gets dizzy and off balance. She has been given a walker, which she just started using. She denies recent falls. She also has bone pain in hips and back due to bone mets. She takes voltaren  gel, oxycodone , and Xtampza  for pain.    She was previously on gabapentin  600 mg BID. She is not sure there was a great difference. She has been off of this about 1 year ago.   She had some migraines after chemotherapy, but this has improved.   Per most recent oncology note from 03/31/22 by Dr. Marguerita Shih: ASSESSMENT AND PLAN: This is a very pleasant 58 years old African-American female recently diagnosed with a stage IV (T2 a, N2, M1 C) non-small cell lung cancer, adenocarcinoma presented with right upper lobe lung mass in addition to right hilar and mediastinal lymphadenopathy as well as innumerable bilateral pulmonary nodules and bone and liver metastasis diagnosed in August 2023 with positive KRAS G12C mutation and PD-L1 expression of 89%. She is currently undergoing systemic chemotherapy with carboplatin  for AUC of 5, Alimta 500 Mg/M2 and Keytruda  200 Mg IV every 3 weeks status post 10 cycles.  The patient  has been tolerating this treatment fairly well except for the frequent nausea and vomiting.  She is currently on Zofran  ODT with some improvement.  She also has early satiety and interested in seeing a gastroenterologist for further evaluation of her condition.  I will make referral to gastroenterology. She had repeat CT scan of the chest, abdomen and pelvis performed recently.  I personally and independently reviewed the scan and discussed the result with the patient today. Her scan showed no concerning findings for disease progression but there was increasing mesenteric stranding and trace pelvic free fluid with areas of wall thickening along the small bowel loops which are not dilated. I recommended for the patient to continue her current treatment with maintenance Alimta and Keytruda  and she will proceed with cycle #11 today. For the dyspepsia and epigastric pain, she will continue her current treatment with Protonix . For the pain management she is followed by the palliative care team and currently on Xtampza  ER 13.5 mg every 12 hours in addition to Oxy IR for breakthrough pain. The patient will come back for follow-up visit in 3 weeks for evaluation before starting cycle #12.   Of note, patient is on zoloft  and buspar  for mood. These medications are working well. This is managed by PCP, Dr. Haig Levan.  06/23/22: After discussion with Dr. Haig Levan, the plan was to wean off Zoloft  and start Cymbalta . Patient preferred to wean slowly so that she could use up the Zoloft  she already had from the pharmacy. Patient is off of  the Zoloft , but has not been able to get the Cymbalta . This had not been ordered because I was waiting to hear from the patient about being off Zoloft .   Symptoms in her legs are about the same. She has needle sensations in feet and legs.   Patient continues to get chemo. She has occasional migraines. This is one of the side effects per patient. She uses Excedrin with relief of  symptoms.   Patient also mentions swelling of face and legs. She is on Lasix  for this as needed.   Most recent Assessment and Plan (06/23/22): This is Cassidy [name], a 58 y.o. female with neuropathic pain in bilateral lower extremities. Her symptoms are most consistent with neuropathy with risk factors including diabetes and prior exposure to cisplatin chemotherapy. She is currently not on any treatment for neuropathy as there was a miscommunication on when she had stopped Zoloft  so we could switch to Cymbalta . Patient's peripheral edema may also be contributing to leg discomfort.   Plan: -Start Cymbalta  30 mg qhs. Patient to call with problems with the medication or if dose if not affective after a few weeks. -Lidocaine  cream PRN -Blood work: B1 -Continue B12 and folate supplementation -Discuss leg swelling with PCP  Since their last visit: *** Seeing pain management - oxy and fentanyl  patch Now on gabapentin  600 mg TID, Cymbalta  30 mg daily  Decided to pursue hospice now?  Neuropathic pain? Headaches?  Chemo?  Cymbalta ?  MEDICATIONS:  Outpatient Encounter Medications as of 06/23/2023  Medication Sig Note   aspirin  EC 81 MG tablet Take 81 mg by mouth daily.    atorvastatin  (LIPITOR) 40 MG tablet TAKE 1 TABLET (40 MG TOTAL) BY MOUTH DAILY.    busPIRone  (BUSPAR ) 15 MG tablet Take 0.5 tablets (7.5 mg total) by mouth 2 (two) times daily.    Carboxymethylcellul-Glycerin (LUBRICATING EYE DROPS OP) Place 1 drop into both eyes daily as needed (dry eyes). 10/22/2022: Taking as needed   carvedilol  (COREG ) 6.25 MG tablet Take 2 tablets (12.5 mg total) by mouth 2 (two) times daily with a meal.    diclofenac  Sodium (VOLTAREN ) 1 % GEL Apply 1 application topically 3 times daily as needed for pain    docusate sodium  (COLACE) 100 MG capsule Take 1 capsule (100 mg total) by mouth daily.    DULoxetine  (CYMBALTA ) 30 MG capsule Take 1 capsule (30 mg total) by mouth daily.    fentaNYL  (DURAGESIC )  25 MCG/HR Place 1 patch onto the skin every 3 (three) days.    ferrous sulfate  325 (65 FE) MG tablet Take 1 tablet (325 mg total) by mouth daily with breakfast.    fluticasone  (FLONASE ) 50 MCG/ACT nasal spray PLACE 1 SPRAY INTO BOTH NOSTRILS DAILY AS NEEDED (CONGESTION).    folic acid  (FOLVITE ) 1 MG tablet TAKE 1 TABLET (1 MG TOTAL) BY MOUTH DAILY.    gabapentin  (NEURONTIN ) 300 MG capsule Take 2 capsules (600 mg total) by mouth 3 (three) times daily.    glucose blood (ACCU-CHEK AVIVA PLUS) test strip USE AS INSTRUCTED EVERY DAY    hyoscyamine  (LEVSIN SL) 0.125 MG SL tablet Place 1 tablet (0.125 mg total) under the tongue every 6 (six) hours as needed. For abdominal cramping    insulin  glargine (LANTUS ) 100 UNIT/ML Solostar Pen Inject 24 Units into the skin every morning.    Insulin  Pen Needle 32G X 4 MM MISC 1 Needle by Does not apply route in the morning and at bedtime.    ipratropium (ATROVENT   HFA) 17 MCG/ACT inhaler Inhale 2 puffs into the lungs every 4 (four) hours as needed for wheezing (COPD).    lactulose  (CHRONULAC ) 10 GM/15ML solution Take 15 mLs (10 g total) by mouth daily as needed for moderate constipation or severe constipation.    lidocaine -prilocaine  (EMLA ) cream APPLY 1 APPLICATION TOPICALLY AS NEEDED. 10/04/2022: For port access comfort   LORazepam  (ATIVAN ) 0.5 MG tablet Take 1 tablet (0.5 mg total) by mouth every 8 (eight) hours as needed for anxiety (nausea).    metoCLOPramide  (REGLAN ) 10 MG tablet Take 1 tablet (10 mg total) by mouth every 8 (eight) hours as needed for nausea.    mometasone -formoterol  (DULERA ) 100-5 MCG/ACT AERO Inhale 2 puffs into the lungs in the morning and at bedtime.    nystatin  (MYCOSTATIN ) 100000 UNIT/ML suspension Take 5 mLs (500,000 Units total) by mouth 4 (four) times daily.    ondansetron  (ZOFRAN -ODT) 8 MG disintegrating tablet Take 1 tablet (8 mg total) by mouth every 8 (eight) hours as needed for nausea or vomiting.    oxyCODONE  (ROXICODONE ) 15 MG  immediate release tablet Take 1-2 tablets (15-30 mg total) by mouth every 4 (four) hours as needed for pain.    pantoprazole  (PROTONIX ) 40 MG tablet Take 1 tablet (40 mg total) by mouth 2 (two) times daily.    polyethylene glycol powder (GAVILAX) 17 GM/SCOOP powder Take 17 g by mouth daily.    spironolactone  (ALDACTONE ) 50 MG tablet Take 1 tablet (50 mg total) by mouth at bedtime.    torsemide  40 MG TABS Take 100 mg by mouth daily.    valACYclovir  (VALTREX ) 1000 MG tablet Take for 3 days prn each outbreak. (Patient taking differently: Take 1,000 mg by mouth daily. Take for 3 days prn each outbreak.)    No facility-administered encounter medications on file as of 06/23/2023.    PAST MEDICAL HISTORY: Past Medical History:  Diagnosis Date   Adenocarcinoma of right lung, stage 4 (HCC) 08/26/2021   Anemia    has sickle cell trait   Atherosclerotic heart disease of native coronary artery with angina pectoris (HCC) 05/2012, 10/2013   a.  s/p PCTA to dRCA and ostial RPAV-PLA vessel + PDA branch (05/2012)  b. [place]  s/p DES to mLAD - Resolute DES 3.0 x 22 (3.25mm -->3.3 mm)   Bipolar depression (HCC)    Breast abscess    a. right side.    COPD (chronic obstructive pulmonary disease) (HCC)    Depression    Diabetic peripheral neuropathy (HCC)    Fibromyalgia    Genital warts    GERD (gastroesophageal reflux disease)    History of hiatal hernia    HLD (hyperlipidemia)    Hypertension    Lung cancer (HCC)    Pain in limb    a. LE VENOUS DUPLEX, 02/05/2009 - no evidence of deep vein thrombosis, Baker's cyst   Pneumonia    PTSD (post-traumatic stress disorder)    Sickle cell trait (HCC)    Tobacco abuse    Tooth caries    Type II diabetes mellitus (HCC)     PAST SURGICAL HISTORY: Past Surgical History:  Procedure Laterality Date   BLADDER SURGERY  1980   "TVT"   BLADDER SURGERY  2003   "TVT"   BREAST EXCISIONAL BIOPSY     BREAST SURGERY Right    I&D for multiple abscesses   CARDIAC  CATHETERIZATION     CORONARY ARTERY BYPASS GRAFT N/A 06/30/2021   Procedure: CORONARY ARTERY BYPASS GRAFTING (CABG) x3 USING  LEFT INTERNAL MAMMARY ARTERY,  RIGHT RADIAL ARTERY AND LEFT GREATER SAPHENOUS VEIN;  Surgeon: Hilarie Lovely, MD;  Location: [facility] OR;  Service: Open Heart Surgery;  Laterality: N/A;   cutting balloon     ENDOVEIN HARVEST OF GREATER SAPHENOUS VEIN Left 06/30/2021   Procedure: ENDOVEIN HARVEST OF GREATER SAPHENOUS VEIN;  Surgeon: Hilarie Lovely, MD;  Location: [facility] OR;  Service: Open Heart Surgery;  Laterality: Left;   EYE SURGERY Left    laser surgery   FINE NEEDLE ASPIRATION  08/21/2021   Procedure: FINE NEEDLE ASPIRATION (FNA) LINEAR;  Surgeon: Prudy Brownie, DO;  Location: [facility];  Service: Pulmonary;;   INCISE AND DRAIN ABCESS  ; 04/26/11; 08/13/11   right breast   INCISION AND DRAINAGE ABSCESS Right 05/09/2012   Procedure: INCISION AND DRAINAGE RIGHT BREAST ABSCESS;  Surgeon: Kari Otto. Eli Grizzle, MD;  Location: [facility];  Service: General;  Laterality: Right;   INCISION AND DRAINAGE ABSCESS Right 02/08/2014   Procedure: INCISION AND DRAINAGE RIGHT BREAST ABSCESS;  Surgeon: Dareen Ebbing, MD;  Location: [facility];  Service: General;  Laterality: Right;   INCISION AND DRAINAGE ABSCESS Right 06/14/2014   Procedure: INCISION AND DRAINAGE RIGHT BREAST ABSCESS;  Surgeon: Dareen Ebbing, MD;  Location: [facility];  Service: General;  Laterality: Right;   IR IMAGING GUIDED PORT INSERTION  12/08/2021   IR PARACENTESIS  02/02/2023   IR PARACENTESIS  02/23/2023   IR PERC TUN PERIT CATH WO PORT S&I /IMAG  02/16/2023   IRRIGATION AND DEBRIDEMENT ABSCESS  12/19/2010   Procedure: IRRIGATION AND DEBRIDEMENT ABSCESS;  Surgeon: Enid Harry, MD;  Location: [facility];  Service: General;  Laterality: Right;   IRRIGATION AND DEBRIDEMENT ABSCESS  08/13/2011   Procedure: MINOR INCISION AND DRAINAGE OF ABSCESS;  Surgeon: Diantha Fossa, MD;  Location: [facility];  Service: General;  Laterality: Right;   Right Breast    IRRIGATION AND DEBRIDEMENT ABSCESS  11/17/2011   Procedure: IRRIGATION AND DEBRIDEMENT ABSCESS;  Surgeon: Kari Otto. Eli Grizzle, MD;  Location: [facility];  Service: General;  Laterality: Right;  irrigation and debridement right recurrent breast abscess   LARYNX SURGERY     LEFT HEART CATH AND CORONARY ANGIOGRAPHY N/A 04/09/2021   Procedure: LEFT HEART CATH AND CORONARY ANGIOGRAPHY;  Surgeon: Lucendia Rusk, MD;  Location: [facility] INVASIVE CV LAB;  Service: Cardiovascular;  Laterality: N/A;   LEFT HEART CATHETERIZATION WITH CORONARY ANGIOGRAM N/A 05/29/2012   Procedure: LEFT HEART CATHETERIZATION WITH CORONARY ANGIOGRAM & PTCA;  Surgeon: Millicent Ally, MD;  Location: [facility] CATH LAB;  Service: Cardiovascular; Bifurcation dRCA-RPAD/PLA & rPDA  PTCA.   LEFT HEART CATHETERIZATION WITH CORONARY ANGIOGRAM N/A 11/08/2013   Procedure: LEFT HEART CATHETERIZATION WITH CORONARY ANGIOGRAM and Coronary Stent Intervention;  Surgeon: Arleen Lacer, MD;  Location: [facility] CATH LAB;  Service: Cardiovascular; mLAD 80% (FFR 0.73) - resolute DES 3.0 x 22 mm (postdilated to 3.3 mm->3.5 mm)   NM MYOVIEW  LTD  01/26/2009   Normal study, no evidence of ischemia, EF 67%   ployp removed from voice box 03/30/12     RADIAL ARTERY HARVEST Right 06/30/2021   Procedure: RADIAL ARTERY HARVEST;  Surgeon: Hilarie Lovely, MD;  Location: [facility];  Service: Open Heart Surgery;  Laterality: Right;   REFRACTIVE SURGERY  ~ 2010   right   TEE WITHOUT CARDIOVERSION N/A 06/30/2021   Procedure: TRANSESOPHAGEAL ECHOCARDIOGRAM (TEE);  Surgeon: Hilarie Lovely, MD;  Location: [facility] OR;  Service: Open Heart Surgery;  Laterality: N/A;  TONSILLECTOMY  1988   VIDEO BRONCHOSCOPY WITH ENDOBRONCHIAL ULTRASOUND Bilateral 08/21/2021   Procedure: VIDEO BRONCHOSCOPY WITH ENDOBRONCHIAL ULTRASOUND;  Surgeon: Prudy Brownie, DO;  Location: [facility];  Service: Pulmonary;  Laterality: Bilateral;    ALLERGIES: Allergies  Allergen Reactions    Amoxil  [Amoxicillin ] Hives   Chantix [Varenicline] Other (See Comments)    Nightmares Hallucinations     Suboxone [Buprenorphine Hcl-Naloxone Hcl] Nausea And Vomiting   Emend [Fosaprepitant  Dimeglumine] Itching    See notes for transfusion on 8/23. Pt reported new onset of itching after Emend infusion was started. 25mg  Benadryl  IV given per MD. Pt tolerated remaining infusion well.     FAMILY HISTORY: Family History  Problem Relation Age of Onset   Esophageal cancer Mother    COPD Mother    Cancer Mother        lymphoma   Heart disease Mother    Stomach cancer Mother    Hyperlipidemia Father    Heart disease Father    Cancer Maternal Aunt        breast, colon   Crohn's disease Maternal Aunt    Hypertension Maternal Grandmother    Diabetes Maternal Grandmother    Hypertension Brother    Rectal cancer Neg Hx    Liver cancer Neg Hx    Colon cancer Neg Hx    Breast cancer Neg Hx     SOCIAL HISTORY: Social History   Tobacco Use   Smoking status: Former    Current packs/day: 0.50    Average packs/day: 0.5 packs/day for 44.4 years (22.2 ttl pk-yrs)    Types: Cigarettes    Start date: 01/12/1979   Smokeless tobacco: Never   Tobacco comments:    PT STATES THAT SHE CHEWS THE GUM FOR SMOKING AND NO LONGER SMOKES  Vaping Use   Vaping status: Never Used  Substance Use Topics   Alcohol  use: No    Alcohol /week: 0.0 standard drinks of alcohol     Comment: recovering addict; 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      Objective:  Vital Signs:  LMP 06/12/2016 (Approximate)   ***  Labs and Imaging review: New  results: 04/10/23: CMP significant for albumin  1.8 CBC significant for Hb 8.8, MCV 92.4 HbA1c: 5.5  TSH (01/19/23): 8.107  Previously reviewed results: 04/07/22: IFE: no M protein B12: 822 B1: not able to be collected   04/21/22: CMP unremarkable TSH wnl   06/01/22: CBC significant for Hb 9.4, MCV 103.4       Lab Results  Component Value Date    HGBA1C 6.2 02/02/2022      Recent Labs[] Expand by Default           Lab Results  Component Value Date    ESRSEDRATE 24 08/31/2021      HbA1c (10/18/15): 7.7   Imaging: MRI brain w/wo contrast (01/26/22): FINDINGS: Brain: No abnormal enhancement. The suspicious area lateral to the frontal horn of the left lateral ventricle is not seen today.   Area of diffusion hyperintensity in the subcortical anterior right frontal lobe is persistent and from shine through, this area is dark on postcontrast assessment.  No incidental infarct, hemorrhage, hydrocephalus, or collection. 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 The 2 smaller are no longer seen and the midline lesion overlying the superior sagittal sinus is regressed to a 7 mm area of enhancement that is not directly contiguous with a vessel.   Sinuses/Orbits: Negative   IMPRESSION: 1. No intracranial enhancement to suggest metastatic disease, including at the left frontal white matter site highlighted previously. 2. Notable regression of calvarial metastases.   MRI lumbar spine wo contrast (07/23/14): IMPRESSION: Mild lumbar degenerative change without significant stenosis or neural impingement.  Assessment/Plan:  This is Cassidy [name], a 58 y.o. female with: ***   Plan: ***  Return to clinic in ***  Total time spent reviewing records, interview, history/exam, documentation, and coordination of care on day of  encounter:  *** min  Rommie Coats, MD

## 2023-06-21 ENCOUNTER — Inpatient Hospital Stay: Attending: Internal Medicine | Admitting: Nurse Practitioner

## 2023-06-21 ENCOUNTER — Encounter: Payer: Self-pay | Admitting: *Deleted

## 2023-06-21 ENCOUNTER — Other Ambulatory Visit: Payer: Self-pay | Admitting: Family Medicine

## 2023-06-23 ENCOUNTER — Ambulatory Visit: Admitting: Neurology

## 2023-06-23 ENCOUNTER — Ambulatory Visit: Payer: Medicaid Other | Admitting: Neurology

## 2023-06-28 ENCOUNTER — Telehealth: Payer: Self-pay

## 2023-06-28 NOTE — Telephone Encounter (Signed)
 Spoke with patient this afternoon regarding her pain medication. She stated that she has been referred to hospice and was unsure who would be handling her medication refills. Informed patient that during her visit with Landa Pine, NP in palliative care on 06/08/23, it was documented that when she is enrolled under hospice care, and they will assist with symptom management as well as medication refills moving forward. Patient expressed gratitude for all the care provided by the cancer center. Encouraged her to call with any further concerns or questions.

## 2023-07-04 ENCOUNTER — Inpatient Hospital Stay: Attending: Internal Medicine

## 2023-07-05 ENCOUNTER — Telehealth: Payer: Self-pay | Admitting: Licensed Clinical Social Worker

## 2023-07-05 ENCOUNTER — Encounter: Payer: Self-pay | Admitting: Licensed Clinical Social Worker

## 2023-07-20 ENCOUNTER — Inpatient Hospital Stay

## 2023-07-25 ENCOUNTER — Other Ambulatory Visit

## 2023-07-25 ENCOUNTER — Ambulatory Visit (HOSPITAL_COMMUNITY)
Admission: RE | Admit: 2023-07-25 | Discharge: 2023-07-25 | Disposition: A | Discharge status: Home / Self Care | Source: Ambulatory Visit | Attending: Internal Medicine | Admitting: Internal Medicine

## 2023-07-25 DIAGNOSIS — J90 Pleural effusion, not elsewhere classified: Secondary | ICD-10-CM | POA: Diagnosis not present

## 2023-07-25 DIAGNOSIS — C3490 Malignant neoplasm of unspecified part of unspecified bronchus or lung: Secondary | ICD-10-CM | POA: Insufficient documentation

## 2023-07-25 DIAGNOSIS — R188 Other ascites: Secondary | ICD-10-CM | POA: Diagnosis not present

## 2023-07-25 DIAGNOSIS — R140 Abdominal distension (gaseous): Secondary | ICD-10-CM | POA: Diagnosis present

## 2023-07-25 DIAGNOSIS — C801 Malignant (primary) neoplasm, unspecified: Secondary | ICD-10-CM | POA: Diagnosis not present

## 2023-07-25 LAB — POCT I-STAT CREATININE: Creatinine, Ser: 1.4 mg/dL — ABNORMAL HIGH (ref 0.44–1.00)

## 2023-07-25 MED ORDER — HEPARIN SOD (PORK) LOCK FLUSH 100 UNIT/ML IV SOLN
500.0000 [IU] | Freq: Once | INTRAVENOUS | Status: AC
  Administered 2023-07-25: 500 [IU] via INTRAVENOUS

## 2023-07-25 MED ORDER — SODIUM CHLORIDE (PF) 0.9 % IJ SOLN
INTRAMUSCULAR | Status: AC
  Filled 2023-07-25: qty 50

## 2023-07-25 MED ORDER — HEPARIN SOD (PORK) LOCK FLUSH 100 UNIT/ML IV SOLN
INTRAVENOUS | Status: AC
  Filled 2023-07-25: qty 5

## 2023-07-25 MED ORDER — IOHEXOL 300 MG/ML  SOLN
80.0000 mL | Freq: Once | INTRAMUSCULAR | Status: AC | PRN
  Administered 2023-07-25: 80 mL via INTRAVENOUS

## 2023-07-27 NOTE — Progress Notes (Signed)
 NEUROLOGY FOLLOW UP OFFICE NOTE  Cassidy [name] [id number]  Subjective:  Cassidy [name] is a 58 y.o. 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  To briefly review: 04/07/22: Patient had a triple bypass surgery in 06/2021. She was having ongoing chest pain. She was later found to have stage IV lung cancer. She has done radiation and chemotherapy.   She had a history of diabetic neuropathy prior to chemotherapy. Symptoms have worsened with chemotherapy. She describes burning pain into both legs up to her knees. She has burning in her fingers as well. She thinks the left leg may be more weak. She gets dizzy and off balance. She has been given a walker, which she just started using. She denies recent falls. She also has bone pain in hips and back due to bone mets. She takes voltaren  gel, oxycodone , and Xtampza  for pain.    She was previously on gabapentin  600 mg BID. She is not sure there was a great difference. She has been off of this about 1 year ago.   She had some migraines after chemotherapy, but this has improved.   Per most recent oncology note from 03/31/22 by Dr. Sherrod: ASSESSMENT AND PLAN: This is a very pleasant 58 years old African-American female recently diagnosed with a stage IV (T2 a, N2, M1 C) non-small cell lung cancer, adenocarcinoma presented with right upper lobe lung mass in addition to right hilar and mediastinal lymphadenopathy as well as innumerable bilateral pulmonary nodules and bone and liver metastasis diagnosed in August 2023 with positive KRAS G12C mutation and PD-L1 expression of 89%. She is currently undergoing systemic chemotherapy with carboplatin  for AUC of 5, Alimta  500 Mg/M2 and Keytruda  200 Mg IV every 3 weeks status post 10 cycles.  The patient  has been tolerating this treatment fairly well except for the frequent nausea and vomiting.  She is currently on Zofran  ODT with some improvement.  She also has early satiety and interested in seeing a gastroenterologist for further evaluation of her condition.  I will make referral to gastroenterology. She had repeat CT scan of the chest, abdomen and pelvis performed recently.  I personally and independently reviewed the scan and discussed the result with the patient today. Her scan showed no concerning findings for disease progression but there was increasing mesenteric stranding and trace pelvic free fluid with areas of wall thickening along the small bowel loops which are not dilated. I recommended for the patient to continue her current treatment with maintenance Alimta  and Keytruda  and she will proceed with cycle #11 today. For the dyspepsia and epigastric pain, she will continue her current treatment with Protonix . For the pain management she is followed by the palliative care team and currently on Xtampza  ER 13.5 mg every 12 hours in addition to Oxy IR for breakthrough pain. The patient will come back for follow-up visit in 3 weeks for evaluation before starting cycle #12.   Of note, patient is on zoloft  and buspar  for mood. These medications are working well. This is managed by PCP, Dr. Chet.  06/23/22: After discussion with Dr. Chet, the plan was to wean off Zoloft  and start Cymbalta . Patient preferred to wean slowly so that she could use up the Zoloft  she already had from the pharmacy. Patient is off of  the Zoloft , but has not been able to get the Cymbalta . This had not been ordered because I was waiting to hear from the patient about being off Zoloft .   Symptoms in her legs are about the same. She has needle sensations in feet and legs.   Patient continues to get chemo. She has occasional migraines. This is one of the side effects per patient. She uses Excedrin with relief of  symptoms.   Patient also mentions swelling of face and legs. She is on Lasix  for this as needed.   Most recent Assessment and Plan (06/23/22): This is Cassidy [name], a 58 y.o. female with neuropathic pain in bilateral lower extremities. Her symptoms are most consistent with neuropathy with risk factors including diabetes and prior exposure to cisplatin chemotherapy. She is currently not on any treatment for neuropathy as there was a miscommunication on when she had stopped Zoloft  so we could switch to Cymbalta . Patient's peripheral edema may also be contributing to leg discomfort.   Plan: -Start Cymbalta  30 mg qhs. Patient to call with problems with the medication or if dose if not affective after a few weeks. -Lidocaine  cream PRN -Blood work: B1 -Continue B12 and folate supplementation -Discuss leg swelling with PCP  Since their last visit: Patient has stage 4 cancer and is getting home hospice. She is losing a lot of weight. She is getting weaker. She knows she is dying. In terms of pain, she has good and bad days. She uses oxycodone  and fentanyl . She takes gabapentin  600 mg TID and Cymbalta  30 mg daily. She is on torsemide  that has helped with swelling. This helps with the pain in her legs. She has fallen about 6 times since last visit. She now has a walker.   MEDICATIONS:  Outpatient Encounter Medications as of 08/10/2023  Medication Sig Note   aspirin  EC 81 MG tablet Take 81 mg by mouth daily.    atorvastatin  (LIPITOR) 40 MG tablet TAKE 1 TABLET (40 MG TOTAL) BY MOUTH DAILY.    busPIRone  (BUSPAR ) 15 MG tablet Take 0.5 tablets (7.5 mg total) by mouth 2 (two) times daily.    Carboxymethylcellul-Glycerin (LUBRICATING EYE DROPS OP) Place 1 drop into both eyes daily as needed (dry eyes). 10/22/2022: Taking as needed   carvedilol  (COREG ) 6.25 MG tablet Take 2 tablets (12.5 mg total) by mouth 2 (two) times daily with a meal.    diclofenac  Sodium (VOLTAREN ) 1 % GEL Apply 1 application  topically 3 times daily as needed for pain    docusate sodium  (COLACE) 100 MG capsule Take 1 capsule (100 mg total) by mouth daily.    DULoxetine  (CYMBALTA ) 30 MG capsule Take 1 capsule (30 mg total) by mouth daily.    fentaNYL  (DURAGESIC ) 25 MCG/HR Place 1 patch onto the skin every 3 (three) days.    ferrous sulfate  325 (65 FE) MG tablet Take 1 tablet (325 mg total) by mouth daily with breakfast.    fluticasone  (FLONASE ) 50 MCG/ACT nasal spray PLACE 1 SPRAY INTO BOTH NOSTRILS DAILY AS NEEDED (CONGESTION).    folic acid  (FOLVITE ) 1 MG tablet TAKE 1 TABLET (1 MG TOTAL) BY MOUTH DAILY.    gabapentin  (NEURONTIN ) 300 MG capsule Take 2 capsules (600 mg total) by mouth 3 (three) times daily.    glucose blood (ACCU-CHEK AVIVA PLUS) test strip USE AS INSTRUCTED EVERY DAY    hyoscyamine  (LEVSIN  SL) 0.125 MG SL tablet Place 1 tablet (0.125 mg total) under the tongue every 6 (six) hours as  needed. For abdominal cramping    insulin  glargine (LANTUS ) 100 UNIT/ML Solostar Pen Inject 24 Units into the skin every morning.    Insulin  Pen Needle 32G X 4 MM MISC 1 Needle by Does not apply route in the morning and at bedtime.    ipratropium (ATROVENT  HFA) 17 MCG/ACT inhaler Inhale 2 puffs into the lungs every 4 (four) hours as needed for wheezing (COPD).    lactulose  (CHRONULAC ) 10 GM/15ML solution Take 15 mLs (10 g total) by mouth daily as needed for moderate constipation or severe constipation.    lidocaine -prilocaine  (EMLA ) cream APPLY 1 APPLICATION TOPICALLY AS NEEDED. 10/04/2022: For port access comfort   LORazepam  (ATIVAN ) 0.5 MG tablet Take 1 tablet (0.5 mg total) by mouth every 8 (eight) hours as needed for anxiety (nausea).    metoCLOPramide  (REGLAN ) 10 MG tablet Take 1 tablet (10 mg total) by mouth every 8 (eight) hours as needed for nausea.    mometasone -formoterol  (DULERA ) 100-5 MCG/ACT AERO Inhale 2 puffs into the lungs in the morning and at bedtime.    nystatin  (MYCOSTATIN ) 100000 UNIT/ML suspension Take  5 mLs (500,000 Units total) by mouth 4 (four) times daily.    ondansetron  (ZOFRAN -ODT) 8 MG disintegrating tablet Take 1 tablet (8 mg total) by mouth every 8 (eight) hours as needed for nausea or vomiting.    oxyCODONE  (ROXICODONE ) 15 MG immediate release tablet Take 1-2 tablets (15-30 mg total) by mouth every 4 (four) hours as needed for pain.    pantoprazole  (PROTONIX ) 40 MG tablet Take 1 tablet (40 mg total) by mouth 2 (two) times daily.    polyethylene glycol powder (GAVILAX) 17 GM/SCOOP powder Take 17 g by mouth daily.    spironolactone  (ALDACTONE ) 50 MG tablet Take 1 tablet (50 mg total) by mouth at bedtime.    torsemide  (DEMADEX ) 20 MG tablet Take 2 tablets (40 mg total) by mouth daily.    valACYclovir  (VALTREX ) 1000 MG tablet Take for 3 days prn each outbreak.    No facility-administered encounter medications on file as of 08/10/2023.    PAST MEDICAL HISTORY: Past Medical History:  Diagnosis Date   Adenocarcinoma of right lung, stage 4 (HCC) 08/26/2021   Anemia    has sickle cell trait   Atherosclerotic heart disease of native coronary artery with angina pectoris (HCC) 05/2012, 10/2013   a.  s/p PCTA to dRCA and ostial RPAV-PLA vessel + PDA branch (05/2012)  b. [place]  s/p DES to mLAD - Resolute DES 3.0 x 22 (3.40mm -->3.3 mm)   Bipolar depression (HCC)    Breast abscess    a. right side.    COPD (chronic obstructive pulmonary disease) (HCC)    Depression    Diabetic peripheral neuropathy (HCC)    Fibromyalgia    Genital warts    GERD (gastroesophageal reflux disease)    History of hiatal hernia    HLD (hyperlipidemia)    Hypertension    Lung cancer (HCC)    Pain in limb    a. LE VENOUS DUPLEX, 02/05/2009 - no evidence of deep vein thrombosis, Baker's cyst   Pneumonia    PTSD (post-traumatic stress disorder)    Sickle cell trait (HCC)    Tobacco abuse    Tooth caries    Type II diabetes mellitus (HCC)     PAST SURGICAL HISTORY: Past Surgical History:  Procedure Laterality  Date   BLADDER SURGERY  1980   TVT   BLADDER SURGERY  2003   TVT   BREAST EXCISIONAL  BIOPSY     BREAST SURGERY Right    I&D for multiple abscesses   CARDIAC CATHETERIZATION     CORONARY ARTERY BYPASS GRAFT N/A 06/30/2021   Procedure: CORONARY ARTERY BYPASS GRAFTING (CABG) x3 USING LEFT INTERNAL MAMMARY ARTERY,  RIGHT RADIAL ARTERY AND LEFT GREATER SAPHENOUS VEIN;  Surgeon: Shyrl Linnie KIDD, MD;  Location: [facility] OR;  Service: Open Heart Surgery;  Laterality: N/A;   cutting balloon     ENDOVEIN HARVEST OF GREATER SAPHENOUS VEIN Left 06/30/2021   Procedure: ENDOVEIN HARVEST OF GREATER SAPHENOUS VEIN;  Surgeon: Shyrl Linnie KIDD, MD;  Location: [facility] OR;  Service: Open Heart Surgery;  Laterality: Left;   EYE SURGERY Left    laser surgery   FINE NEEDLE ASPIRATION  08/21/2021   Procedure: FINE NEEDLE ASPIRATION (FNA) LINEAR;  Surgeon: Brenna Adine CROME, DO;  Location: [facility];  Service: Pulmonary;;   INCISE AND DRAIN ABCESS  ; 04/26/11; 08/13/11   right breast   INCISION AND DRAINAGE ABSCESS Right 05/09/2012   Procedure: INCISION AND DRAINAGE RIGHT BREAST ABSCESS;  Surgeon: Donnice POUR. Belinda, MD;  Location: [facility] OR;  Service: General;  Laterality: Right;   INCISION AND DRAINAGE ABSCESS Right 02/08/2014   Procedure: INCISION AND DRAINAGE RIGHT BREAST ABSCESS;  Surgeon: Donnice Belinda, MD;  Location: [facility];  Service: General;  Laterality: Right;   INCISION AND DRAINAGE ABSCESS Right 06/14/2014   Procedure: INCISION AND DRAINAGE RIGHT BREAST ABSCESS;  Surgeon: Donnice Belinda, MD;  Location: [facility];  Service: General;  Laterality: Right;   IR IMAGING GUIDED PORT INSERTION  12/08/2021   IR PARACENTESIS  02/02/2023   IR PARACENTESIS  02/23/2023   IR PERC TUN PERIT CATH WO PORT S&I /IMAG  02/16/2023   IRRIGATION AND DEBRIDEMENT ABSCESS  12/19/2010   Procedure: IRRIGATION AND DEBRIDEMENT ABSCESS;  Surgeon: Donnice Bury, MD;  Location: [facility];  Service: General;  Laterality: Right;   IRRIGATION AND  DEBRIDEMENT ABSCESS  08/13/2011   Procedure: MINOR INCISION AND DRAINAGE OF ABSCESS;  Surgeon: Lynwood KIDD Pina, MD;  Location: [facility];  Service: General;  Laterality: Right;  Right Breast    IRRIGATION AND DEBRIDEMENT ABSCESS  11/17/2011   Procedure: IRRIGATION AND DEBRIDEMENT ABSCESS;  Surgeon: Donnice POUR. Belinda, MD;  Location: [facility];  Service: General;  Laterality: Right;  irrigation and debridement right recurrent breast abscess   LARYNX SURGERY     LEFT HEART CATH AND CORONARY ANGIOGRAPHY N/A 04/09/2021   Procedure: LEFT HEART CATH AND CORONARY ANGIOGRAPHY;  Surgeon: Dann Candyce RAMAN, MD;  Location: [facility] INVASIVE CV LAB;  Service: Cardiovascular;  Laterality: N/A;   LEFT HEART CATHETERIZATION WITH CORONARY ANGIOGRAM N/A 05/29/2012   Procedure: LEFT HEART CATHETERIZATION WITH CORONARY ANGIOGRAM & PTCA;  Surgeon: Debby DELENA Sor, MD;  Location: [facility] CATH LAB;  Service: Cardiovascular; Bifurcation dRCA-RPAD/PLA & rPDA  PTCA.   LEFT HEART CATHETERIZATION WITH CORONARY ANGIOGRAM N/A 11/08/2013   Procedure: LEFT HEART CATHETERIZATION WITH CORONARY ANGIOGRAM and Coronary Stent Intervention;  Surgeon: Alm LELON Clay, MD;  Location: [facility] CATH LAB;  Service: Cardiovascular; mLAD 80% (FFR 0.73) - resolute DES 3.0 x 22 mm (postdilated to 3.3 mm->3.5 mm)   NM MYOVIEW  LTD  01/26/2009   Normal study, no evidence of ischemia, EF 67%   ployp removed from voice box 03/30/12     RADIAL ARTERY HARVEST Right 06/30/2021   Procedure: RADIAL ARTERY HARVEST;  Surgeon: Shyrl Linnie KIDD, MD;  Location: [facility] OR;  Service: Open Heart Surgery;  Laterality: Right;  REFRACTIVE SURGERY  ~ 2010   right   TEE WITHOUT CARDIOVERSION N/A 06/30/2021   Procedure: TRANSESOPHAGEAL ECHOCARDIOGRAM (TEE);  Surgeon: Shyrl Linnie KIDD, MD;  Location: [facility] OR;  Service: Open Heart Surgery;  Laterality: N/A;   TONSILLECTOMY  1988   VIDEO BRONCHOSCOPY WITH ENDOBRONCHIAL ULTRASOUND Bilateral 08/21/2021   Procedure: VIDEO BRONCHOSCOPY WITH  ENDOBRONCHIAL ULTRASOUND;  Surgeon: Brenna Adine CROME, DO;  Location: [facility] ENDOS[facility];  Service: Pulmonary;  Laterality: Bilateral;    ALLERGIES: Allergies  Allergen Reactions   Amoxil  [Amoxicillin ] Hives   Chantix [Varenicline] Other (See Comments)    Nightmares Hallucinations     Suboxone [Buprenorphine Hcl-Naloxone Hcl] Nausea And Vomiting   Emend [Fosaprepitant  Dimeglumine] Itching    See notes for transfusion on 8/23. Pt reported new onset of itching after Emend infusion was started. 25mg  Benadryl  IV given per MD. Pt tolerated remaining infusion well.     FAMILY HISTORY: Family History  Problem Relation Age of Onset   Esophageal cancer Mother    COPD Mother    Cancer Mother        lymphoma   Heart disease Mother    Stomach cancer Mother    Hyperlipidemia Father    Heart disease Father    Cancer Maternal Aunt        breast, colon   Crohn's disease Maternal Aunt    Hypertension Maternal Grandmother    Diabetes Maternal Grandmother    Hypertension Brother    Rectal cancer Neg Hx    Liver cancer Neg Hx    Colon cancer Neg Hx    Breast cancer Neg Hx     SOCIAL HISTORY: Social History   Tobacco Use   Smoking status: Former    Current packs/day: 0.50    Average packs/day: 0.5 packs/day for 44.6 years (22.3 ttl pk-yrs)    Types: Cigarettes    Start date: 01/12/1979   Smokeless tobacco: Never   Tobacco comments:    PT STATES THAT SHE CHEWS THE GUM FOR SMOKING AND NO LONGER SMOKES  Vaping Use   Vaping status: Never Used  Substance Use Topics   Alcohol  use: No    Alcohol /week: 0.0 standard drinks of alcohol     Comment: recovering addict; 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      Objective:  Vital Signs:  BP (!) 89/61   Pulse 80   Ht 5' 7 (1.702 m)   Wt 152 lb (68.9 kg)   LMP 06/12/2016 (Approximate)   SpO2 98%   BMI 23.81 kg/m   General: No acute distress.  Patient appears well-groomed.   Head:  Normocephalic/atraumatic Neck: supple Lungs: Clear to auscultation bilaterally. Extremities: Significant edema in bilateral lower limbs  Neurological Exam: Mental status: alert and oriented, speech fluent and not dysarthric, language intact.  Cranial nerves: CN I: not tested CN II: pupils equal, round and reactive to light, visual fields intact CN III, IV, VI:  full range of motion, no nystagmus, no ptosis CN V: facial sensation intact. CN VII: upper and lower face symmetric CN VIII: hearing intact CN IX, X: uvula midline CN XI: sternocleidomastoid and trapezius muscles intact  CN XII: tongue midline  Bulk & Tone: normal, no fasciculations. Motor:  muscle strength 5/5 in upper extremities; 4+ in bilateral lower extremities Deep Tendon Reflexes:  2+ throughout.   Sensation:  Pinprick, vibratory, and proprioceptive sensation diminished in bilateral lower limbs. Finger to nose testing:  Without dysmetria.   Gait:  Walks with walker. Slow and unsteady.  Labs and Imaging review: New results: 04/10/23: CMP significant for albumin  1.8 CBC significant for Hb 8.8, MCV 92.4 HbA1c: 5.5  TSH (01/19/23): 8.107  Previously reviewed results: 04/07/22: IFE: no M protein B12: 822 B1: not able to be collected   04/21/22: CMP unremarkable TSH wnl   06/01/22: CBC significant for Hb 9.4, MCV 103.4       Lab Results  Component Value Date    HGBA1C 6.2 02/02/2022      Recent Labs[] Expand by Default           Lab Results  Component Value Date    ESRSEDRATE 24 08/31/2021      HbA1c (10/18/15): 7.7   Imaging: MRI brain w/wo contrast (01/26/22): FINDINGS: Brain: No abnormal enhancement. The  suspicious area lateral to the frontal horn of the left lateral ventricle is not seen today.   Area of diffusion hyperintensity in the subcortical anterior right frontal lobe is persistent and from shine through, this area is dark on postcontrast assessment.   No incidental infarct, hemorrhage, hydrocephalus, or collection. 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 The 2 smaller are no longer seen and the midline lesion overlying the superior sagittal sinus is regressed to a 7 mm area of enhancement that is not directly contiguous with a vessel.   Sinuses/Orbits: Negative   IMPRESSION: 1. No intracranial enhancement to suggest metastatic disease, including at the left frontal white matter site highlighted previously. 2. Notable regression of calvarial metastases.   MRI lumbar spine wo contrast (07/23/14): IMPRESSION: Mild lumbar degenerative change without significant stenosis or neural impingement.  Assessment/Plan:  This is SHANIYAH WIX, a 58 y.o. female with neuropathic pain in bilateral lower extremities. Her symptoms are most consistent with neuropathy with risk factors including diabetes and prior exposure to cisplatin chemotherapy. She also likely has contributions from significant edema in her legs. Unfortunately, patient has stage 4 lung cancer and is under hospice care now and has pains related to this as well.  Plan: -Continue gabapentin  600 mg three times daily -Continue Cymbalta  30 mg daily -Rest of pain control per hospice/pain management -Fall precautions discussed  Return to clinic in 6 months (patient preference to continue to be seen)  Total time spent reviewing records, interview, history/exam, documentation, and coordination of care on day of encounter:  30 min  Venetia Potters, MD

## 2023-08-01 ENCOUNTER — Ambulatory Visit: Admitting: Internal Medicine

## 2023-08-04 ENCOUNTER — Other Ambulatory Visit: Payer: Self-pay | Admitting: Licensed Clinical Social Worker

## 2023-08-08 NOTE — Patient Outreach (Signed)
 Complex Care Management   Visit Note  08/04/2023  Name:  Cassidy [name] MRN: [id number] DOB: [birth year]  Situation: Referral received for Complex Care Management related to Mental/Behavioral Health diagnosis Depression, Bipolar Disorder, Anxiety, and PTSD I obtained verbal consent from Patient.  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  s/p PCTA to dRCA and ostial RPAV-PLA vessel + PDA branch (05/2012)  b. [place]  s/p DES to mLAD - Resolute DES 3.0 x 22 (3.3mm -->3.3 mm)   Bipolar depression (HCC)    Breast abscess    a. right side.    COPD (chronic obstructive pulmonary disease) (HCC)    Depression    Diabetic peripheral neuropathy (HCC)    Fibromyalgia    Genital warts    GERD (gastroesophageal reflux disease)    History of hiatal hernia    HLD (hyperlipidemia)    Hypertension    Lung cancer (HCC)    Pain in limb    a. LE VENOUS DUPLEX, 02/05/2009 - no evidence of deep vein thrombosis, Baker's cyst   Pneumonia    PTSD (post-traumatic stress disorder)    Sickle cell trait (HCC)    Tobacco abuse    Tooth caries    Type II diabetes mellitus (HCC)     Assessment: Patient Reported Symptoms:  Cognitive Cognitive Status: Alert and oriented to person, place, and time, Normal speech and language skills      Neurological Neurological Review of Symptoms: Not assessed    HEENT HEENT Symptoms Reported: Not assessed      Cardiovascular Cardiovascular Symptoms Reported: Not assessed    Respiratory Respiratory Symptoms Reported: Not assesed    Endocrine Endocrine Symptoms Reported: Not assessed    Gastrointestinal Gastrointestinal Symptoms Reported: Not assessed      Genitourinary Genitourinary Symptoms Reported: Not assessed     Integumentary Integumentary Symptoms Reported: Not assessed    Musculoskeletal          Psychosocial Psychosocial Symptoms Reported: No symptoms reported Additional Psychological Details: Patient reports she is doing well and feels supported by family and providers. Pt is participating in hospice through [facility] Management Strategies: Adequate rest, Coping strategies, Medication therapy, Support system Major Change/Loss/Stressor/Fears (CP): Medical condition, self Techniques to Cope with Loss/Stress/Change: Diversional activities, Spiritual practice(s)        03/29/2023    2:30 PM  Depression screen PHQ 2/9  Decreased Interest 1  Down, Depressed, Hopeless 1  PHQ - 2 Score 2  Altered sleeping 1  Tired, decreased energy 1  Change in appetite 0  Feeling bad or failure about yourself  0  Trouble concentrating 0  Moving slowly or fidgety/restless 0  Suicidal thoughts 0  PHQ-9 Score 4  Difficult doing work/chores Extremely dIfficult    There were no vitals filed for this visit.  Medications Reviewed Today     Reviewed by Lucille Crichlow D, LCSW (Social Worker) on 08/08/23 at 1346  Med List Status: <None>   Medication Order Taking? Sig Documenting Provider Last Dose Status Informant  aspirin  EC 81 MG tablet [id number] No Take 81 mg by mouth daily. [provider] Taking Active Self  atorvastatin  (LIPITOR) 40 MG tablet [id number] No TAKE 1 TABLET (40 MG TOTAL) BY  MOUTH DAILY. Chet Mad, DO Taking Active Self  busPIRone  (BUSPAR ) 15 MG tablet [id number] No Take 0.5 tablets (7.5 mg total) by mouth 2 (two) times daily. Espinoza, Alejandra, DO Taking Active Self  Carboxymethylcellul-Glycerin (LUBRICATING EYE DROPS OP) [id number] No Place 1 drop into both eyes daily as needed (dry eyes). [provider] Taking Active Self           Med Note CHANETTA, CHERYL A   Fri Oct 22, 2022  2:20 PM) Taking as needed  carvedilol  (COREG ) 6.25 MG tablet  [id number] No Take 2 tablets (12.5 mg total) by mouth 2 (two) times daily with a meal. Bryan Bianchi, MD Taking Active   diclofenac  Sodium (VOLTAREN ) 1 % GEL [id number] No Apply 1 application topically 3 times daily as needed for pain Nicholas Bar, MD Taking Active   docusate sodium  (COLACE) 100 MG capsule [id number] No Take 1 capsule (100 mg total) by mouth daily. Nicholas Bar, MD Taking Active   DULoxetine  (CYMBALTA ) 30 MG capsule [id number] No Take 1 capsule (30 mg total) by mouth daily. Leigh Venetia CROME, MD Taking Active Self  fentaNYL  (DURAGESIC ) 25 MCG/HR [id number] No Place 1 patch onto the skin every 3 (three) days. Pickenpack-Cousar, Fannie SAILOR, NP Taking Active   ferrous sulfate  325 (65 FE) MG tablet [id number] No Take 1 tablet (325 mg total) by mouth daily with breakfast. Nicholas Bar, MD Taking Active   fluticasone  (FLONASE ) 50 MCG/ACT nasal spray [id number] No PLACE 1 SPRAY INTO BOTH NOSTRILS DAILY AS NEEDED (CONGESTION). Tharon Lung, MD Taking Active   folic acid  (FOLVITE ) 1 MG tablet [id number] No TAKE 1 TABLET (1 MG TOTAL) BY MOUTH DAILY. Sherrod Sherrod, MD Taking Active   gabapentin  (NEURONTIN ) 300 MG capsule [id number] No Take 2 capsules (600 mg total) by mouth 3 (three) times daily. Pickenpack-Cousar, Fannie SAILOR, NP Taking Active   glucose blood (ACCU-CHEK AVIVA PLUS) test strip [id number] No USE AS INSTRUCTED EVERY DAY Delores Suzann HERO, MD Taking Active   hyoscyamine  (LEVSIN  SL) 0.125 MG SL tablet [id number] No Place 1 tablet (0.125 mg total) under the tongue every 6 (six) hours as needed. For abdominal cramping Heilingoetter, Cassandra L, PA-C Taking Active Self  insulin  glargine (LANTUS ) 100 UNIT/ML Solostar Pen [id number] No Inject 24 Units into the skin every morning. Espinoza, Alejandra, DO Taking Active Self  Insulin  Pen Needle 32G X 4 MM MISC [id number] No 1 Needle by Does not apply route in the morning and at bedtime. Espinoza, Alejandra, DO Taking Active Self  ipratropium (ATROVENT   HFA) 17 MCG/ACT inhaler [id number] No Inhale 2 puffs into the lungs every 4 (four) hours as needed for wheezing (COPD). [provider] Taking Active Self  lactulose  (CHRONULAC ) 10 GM/15ML solution [id number] No Take 15 mLs (10 g total) by mouth daily as needed for moderate constipation or severe constipation. Shitarev, Dimitry, MD Taking Active   lidocaine -prilocaine  (EMLA ) cream [id number] No APPLY 1 APPLICATION TOPICALLY AS NEEDED. Heilingoetter, Cassandra L, PA-C Taking Active Self           Med Note CHANETTA, CHERYL A   Mon Oct 04, 2022  1:49 PM) For port access comfort  LORazepam  (ATIVAN ) 0.5 MG tablet [id number] No Take 1 tablet (0.5 mg total) by mouth every 8 (eight) hours as needed for anxiety (nausea). Pickenpack-Cousar, Fannie SAILOR, NP Taking Active   metoCLOPramide  (REGLAN ) 10 MG tablet [id number] No Take 1 tablet (10 mg total) by mouth every 8 (eight) hours as needed for nausea. Pickenpack-Cousar, Fannie SAILOR, NP Taking Active  mometasone -formoterol  (DULERA ) 100-5 MCG/ACT AERO [id number] No Inhale 2 puffs into the lungs in the morning and at bedtime. Nicholas Bar, MD Taking Active   nystatin  (MYCOSTATIN ) 100000 UNIT/ML suspension [id number] No Take 5 mLs (500,000 Units total) by mouth 4 (four) times daily. Pickenpack-Cousar, Fannie SAILOR, NP Taking Active   ondansetron  (ZOFRAN -ODT) 8 MG disintegrating tablet [id number] No Take 1 tablet (8 mg total) by mouth every 8 (eight) hours as needed for nausea or vomiting. Heilingoetter, Cassandra L, PA-C Taking Active Self  oxyCODONE  (ROXICODONE ) 15 MG immediate release tablet [id number] No Take 1-2 tablets (15-30 mg total) by mouth every 4 (four) hours as needed for pain. Pickenpack-Cousar, Fannie SAILOR, NP Taking Active   pantoprazole  (PROTONIX ) 40 MG tablet [id number] No Take 1 tablet (40 mg total) by mouth 2 (two) times daily. Zehr, Jessica D, PA-C Taking Active Self  polyethylene glycol powder (GAVILAX) 17 GM/SCOOP powder [id number] No Take 17 g by mouth  daily. Nicholas Bar, MD Taking Active   spironolactone  (ALDACTONE ) 50 MG tablet [id number] No Take 1 tablet (50 mg total) by mouth at bedtime. Orlando Pond, DO Taking Active   torsemide  (DEMADEX ) 20 MG tablet [id number]  Take 2 tablets (40 mg total) by mouth daily. Bryan Bianchi, MD  Active   valACYclovir  (VALTREX ) 1000 MG tablet [id number] No Take for 3 days prn each outbreak.  Patient taking differently: Take 1,000 mg by mouth daily. Take for 3 days prn each outbreak.   Rudy Carlin LABOR, MD Taking Active Self            Recommendation:   Continue Current Plan of Care  Follow Up Plan:   Closing From:  Complex Care Management  Rolin Kerns, LCSW [place]  [facility], [facility] Clinical Social Worker Direct Dial: [phone]  Fax: [phone] Website: [email or web address] 1:53 PM

## 2023-08-08 NOTE — Patient Instructions (Signed)
 Visit Information  Cassidy [name] was given information about Medicaid Managed Care team care coordination services as a part of their [facility] Medicaid benefit. Cassidy [name] verbally consented to engagement with the [facility] Managed Care team.   If you are experiencing a medical emergency, please call 911 or report to your local emergency department or urgent care.   If you have a non-emergency medical problem during routine business hours, please contact your provider's office and ask to speak with a nurse.   For questions related to your [facility], please call: [phone] or visit the homepage here: [email or web address]  If you would like to schedule transportation through your [facility], please call the following number at least 2 days in advance of your appointment: [phone]   Rides for urgent appointments can also be made after hours by calling Member Services.  Call the Behavioral Health Crisis Line at [phone], at any time, 24 hours a day, 7 days a week. If you are in danger or need immediate medical attention call 911.  If you would like help to quit smoking, call 1-800-QUIT-NOW ((667) 253-5225) OR Espaol: 1-855-Djelo-Ya (8-144-664-6430) o para ms informacin haga clic aqu or Text READY to 799-599 to register via text  [name] - following are the goals we discussed in your visit today:   Goals Addressed             This Visit's Progress    COMPLETED: LCSW [facility] Social Work Care Plan   On track    Problems:   Disease Management support and education needs related to Anxiety with Excessive Worry,, Bipolar Disorder, Depression: depressed mood, and PTSD  CSW Clinical Goal(s):   Over the next 90 days the Patient will attend all scheduled medical appointments as evidenced by patient report and care team review of appointment completion  in electronic MEDICAL RECORD NUMBER  demonstrate a reduction in symptoms related to Anxiety with Panic Symptoms, Bipolar Disorder Depression: depressed mood PTSD .  Interventions:  Mental Health:  Evaluation of current treatment plan related to Anxiety with Panic Symptoms,, Bipolar Disorder, Depression: depressed mood, and PTSD Active listening / Reflection utilized Emotional Support Provided Mindfulness or Relaxation training provided Motivational Interviewing employed Provided general psycho-education for mental health needs Solution-Focued Strategies employed:  Patient Goals/Self-Care Activities:  Increase coping skills, healthy habits, and self-management skills  Follow up with [employer]  Plan:   Telephone follow up appointment with care management team member scheduled for:  2-4 weeks        Please see education materials related to topics discussed provided by MyChart link.  Patient verbalizes understanding of instructions and care plan provided today and agrees to view in MyChart. Active MyChart status and patient understanding of how to access instructions and care plan via MyChart confirmed with patient.     No further follow up required: Patient is followed through [facility] and is not in need of any additional support services  Rolin Kerns, LCSW [facility]  [facility], [facility] Clinical Social Worker Direct Dial: [phone]  Fax: [phone] Website: [email or web address] 1:54 PM   Following is a copy of your plan of care:  There are no care plans that you recently modified to display for this patient.

## 2023-08-10 ENCOUNTER — Encounter: Payer: Self-pay | Admitting: Neurology

## 2023-08-10 ENCOUNTER — Ambulatory Visit (INDEPENDENT_AMBULATORY_CARE_PROVIDER_SITE_OTHER): Admitting: Neurology

## 2023-08-10 VITALS — BP 89/61 | HR 80 | Ht 67.0 in | Wt 152.0 lb

## 2023-08-10 DIAGNOSIS — G620 Drug-induced polyneuropathy: Secondary | ICD-10-CM

## 2023-08-10 DIAGNOSIS — E1142 Type 2 diabetes mellitus with diabetic polyneuropathy: Secondary | ICD-10-CM

## 2023-08-10 DIAGNOSIS — T451X5A Adverse effect of antineoplastic and immunosuppressive drugs, initial encounter: Secondary | ICD-10-CM

## 2023-08-10 DIAGNOSIS — C3491 Malignant neoplasm of unspecified part of right bronchus or lung: Secondary | ICD-10-CM | POA: Diagnosis not present

## 2023-08-10 DIAGNOSIS — R4589 Other symptoms and signs involving emotional state: Secondary | ICD-10-CM

## 2023-08-10 DIAGNOSIS — R634 Abnormal weight loss: Secondary | ICD-10-CM | POA: Diagnosis not present

## 2023-08-10 DIAGNOSIS — R209 Unspecified disturbances of skin sensation: Secondary | ICD-10-CM | POA: Diagnosis not present

## 2023-08-10 NOTE — Patient Instructions (Signed)
Preventing Falls at Home  Falls are common, often dreaded events in the lives of older people. Aside from the obvious injuries and even death that may result, fall can cause wide-ranging consequences including loss of independence, mental decline, decreased activity and mobility. Younger people are also at risk of falling, especially those with chronic illnesses and fatigue.  Ways to reduce risk for falling Examine diet and medications. Warm foods and alcohol dilate blood vessels, which can lead to dizziness when standing. Sleep aids, antidepressants and pain medications can also increase the likelihood of a fall.  Get a vision exam. Poor vision, cataracts and glaucoma increase the chances of falling.  Check foot gear. Shoes should fit snugly and have a sturdy, nonskid sole and a broad, low heel  Participate in a physician-approved exercise program to build and maintain muscle strength and improve balance and coordination. Programs that use ankle weights or stretch bands are excellent for muscle-strengthening. Water aerobics programs and low-impact Tai Chi programs have also been shown to improve balance and coordination.  Increase vitamin D intake. Vitamin D improves muscle strength and increases the amount of calcium the body is able to absorb and deposit in bones.  How to prevent falls from common hazards Floors - Remove all loose wires, cords, and throw rugs. Minimize clutter. Make sure rugs are anchored and smooth. Keep furniture in its usual place.  Chairs -- Use chairs with straight backs, armrests and firm seats. Add firm cushions to existing pieces to add height.  Bathroom - Install grab bars and non-skid tape in the tub or shower. Use a bathtub transfer bench or a shower chair with a back support Use an elevated toilet seat and/or safety rails to assist standing from a low surface. Do not use towel racks or bathroom tissue holders to help you stand.  Lighting - Make sure halls,  stairways, and entrances are well-lit. Install a night light in your bathroom or hallway. Make sure there is a light switch at the top and bottom of the staircase. Turn lights on if you get up in the middle of the night. Make sure lamps or light switches are within reach of the bed if you have to get up during the night.  Kitchen - Install non-skid rubber mats near the sink and stove. Clean spills immediately. Store frequently used utensils, pots, pans between waist and eye level. This helps prevent reaching and bending. Sit when getting things out of lower cupboards.  Living room/ Bedrooms - Place furniture with wide spaces in between, giving enough room to move around. Establish a route through the living room that gives you something to hold onto as you walk.  Stairs - Make sure treads, rails, and rugs are secure. Install a rail on both sides of the stairs. If stairs are a threat, it might be helpful to arrange most of your activities on the lower level to reduce the number of times you must climb the stairs.  Entrances and doorways - Install metal handles on the walls adjacent to the doorknobs of all doors to make it more secure as you travel through the doorway.  Tips for maintaining balance Keep at least one hand free at all times. Try using a backpack or fanny pack to hold things rather than carrying them in your hands. Never carry objects in both hands when walking as this interferes with keeping your balance.  Attempt to swing both arms from front to back while walking. This might require a conscious   your balance.   Attempt to swing both arms from front to back while walking. This might require a conscious effort if Parkinson's disease has diminished your movement. It will, however, help you to maintain balance and posture, and reduce fatigue.   Consciously lift your feet off of the ground when walking. Shuffling and dragging of the feet is a common culprit in losing your balance.   When trying to navigate turns, use a "U" technique of facing forward and making a wide turn, rather than  pivoting sharply.   Try to stand with your feet shoulder-length apart. When your feet are close together for any length of time, you increase your risk of losing your balance and falling.   Do one thing at a time. Don't try to walk and accomplish another task, such as reading or looking around. The decrease in your automatic reflexes complicates motor function, so the less distraction, the better.   Do not wear rubber or gripping soled shoes, they might "catch" on the floor and cause tripping.   Move slowly when changing positions. Use deliberate, concentrated movements and, if needed, use a grab bar or walking aid. Count 15 seconds between each movement. For example, when rising from a seated position, wait 15 seconds after standing to begin walking.   If balance is a continuous problem, you might want to consider a walking aid such as a cane, walking stick, or walker. Once you've mastered walking with help, you might be ready to try it on your own again.

## 2023-09-05 ENCOUNTER — Inpatient Hospital Stay

## 2023-09-26 ENCOUNTER — Other Ambulatory Visit: Payer: Self-pay

## 2023-09-26 ENCOUNTER — Encounter (HOSPITAL_COMMUNITY): Payer: Self-pay

## 2023-09-26 ENCOUNTER — Emergency Department (HOSPITAL_COMMUNITY)

## 2023-09-26 ENCOUNTER — Emergency Department (HOSPITAL_COMMUNITY): Admission: EM | Admit: 2023-09-26 | Discharge: 2023-09-26 | Disposition: A | Discharge status: Home / Self Care

## 2023-09-26 DIAGNOSIS — R231 Pallor: Secondary | ICD-10-CM | POA: Diagnosis not present

## 2023-09-26 DIAGNOSIS — J449 Chronic obstructive pulmonary disease, unspecified: Secondary | ICD-10-CM | POA: Insufficient documentation

## 2023-09-26 DIAGNOSIS — Z794 Long term (current) use of insulin: Secondary | ICD-10-CM | POA: Insufficient documentation

## 2023-09-26 DIAGNOSIS — E869 Volume depletion, unspecified: Secondary | ICD-10-CM | POA: Diagnosis not present

## 2023-09-26 DIAGNOSIS — I2510 Atherosclerotic heart disease of native coronary artery without angina pectoris: Secondary | ICD-10-CM | POA: Insufficient documentation

## 2023-09-26 DIAGNOSIS — R110 Nausea: Secondary | ICD-10-CM | POA: Diagnosis not present

## 2023-09-26 DIAGNOSIS — E1140 Type 2 diabetes mellitus with diabetic neuropathy, unspecified: Secondary | ICD-10-CM | POA: Insufficient documentation

## 2023-09-26 DIAGNOSIS — R2243 Localized swelling, mass and lump, lower limb, bilateral: Secondary | ICD-10-CM | POA: Insufficient documentation

## 2023-09-26 DIAGNOSIS — C3490 Malignant neoplasm of unspecified part of unspecified bronchus or lung: Secondary | ICD-10-CM | POA: Diagnosis not present

## 2023-09-26 DIAGNOSIS — I82411 Acute embolism and thrombosis of right femoral vein: Secondary | ICD-10-CM

## 2023-09-26 DIAGNOSIS — R0602 Shortness of breath: Secondary | ICD-10-CM | POA: Diagnosis not present

## 2023-09-26 DIAGNOSIS — J90 Pleural effusion, not elsewhere classified: Secondary | ICD-10-CM | POA: Diagnosis not present

## 2023-09-26 DIAGNOSIS — I10 Essential (primary) hypertension: Secondary | ICD-10-CM | POA: Diagnosis not present

## 2023-09-26 DIAGNOSIS — Z7982 Long term (current) use of aspirin: Secondary | ICD-10-CM | POA: Diagnosis not present

## 2023-09-26 DIAGNOSIS — R0789 Other chest pain: Secondary | ICD-10-CM | POA: Diagnosis not present

## 2023-09-26 DIAGNOSIS — Z85118 Personal history of other malignant neoplasm of bronchus and lung: Secondary | ICD-10-CM | POA: Diagnosis not present

## 2023-09-26 DIAGNOSIS — J984 Other disorders of lung: Secondary | ICD-10-CM | POA: Diagnosis not present

## 2023-09-26 DIAGNOSIS — R911 Solitary pulmonary nodule: Secondary | ICD-10-CM | POA: Diagnosis not present

## 2023-09-26 DIAGNOSIS — R079 Chest pain, unspecified: Secondary | ICD-10-CM | POA: Diagnosis not present

## 2023-09-26 DIAGNOSIS — R42 Dizziness and giddiness: Secondary | ICD-10-CM | POA: Diagnosis not present

## 2023-09-26 DIAGNOSIS — R531 Weakness: Secondary | ICD-10-CM | POA: Diagnosis not present

## 2023-09-26 DIAGNOSIS — M7989 Other specified soft tissue disorders: Secondary | ICD-10-CM | POA: Diagnosis not present

## 2023-09-26 LAB — COMPREHENSIVE METABOLIC PANEL WITH GFR
ALT: 7 U/L (ref 0–44)
AST: 13 U/L — ABNORMAL LOW (ref 15–41)
Albumin: 2.5 g/dL — ABNORMAL LOW (ref 3.5–5.0)
Alkaline Phosphatase: 59 U/L (ref 38–126)
Anion gap: 8 (ref 5–15)
BUN: 10 mg/dL (ref 6–20)
CO2: 23 mmol/L (ref 22–32)
Calcium: 8.5 mg/dL — ABNORMAL LOW (ref 8.9–10.3)
Chloride: 107 mmol/L (ref 98–111)
Creatinine, Ser: 1.03 mg/dL — ABNORMAL HIGH (ref 0.44–1.00)
GFR, Estimated: >60 mL/min (ref >60)
Glucose, Bld: 85 mg/dL (ref 70–99)
Potassium: 3.8 mmol/L (ref 3.5–5.1)
Sodium: 138 mmol/L (ref 135–145)
Total Bilirubin: 0.7 mg/dL (ref 0.0–1.2)
Total Protein: 6.1 g/dL — ABNORMAL LOW (ref 6.5–8.1)

## 2023-09-26 LAB — CBG MONITORING, ED
Glucose-Capillary: 66 mg/dL — ABNORMAL LOW (ref 70–99)
Glucose-Capillary: 74 mg/dL (ref 70–99)

## 2023-09-26 LAB — URINALYSIS, ROUTINE W REFLEX MICROSCOPIC
Bilirubin Urine: NEGATIVE
Glucose, UA: NEGATIVE mg/dL
Hgb urine dipstick: NEGATIVE
Ketones, ur: NEGATIVE mg/dL
Leukocytes,Ua: NEGATIVE
Nitrite: NEGATIVE
Protein, ur: NEGATIVE mg/dL
Specific Gravity, Urine: 1.01 (ref 1.005–1.030)
pH: 7 (ref 5.0–8.0)

## 2023-09-26 LAB — CBC
HCT: 33.2 % — ABNORMAL LOW (ref 36.0–46.0)
Hemoglobin: 10.8 g/dL — ABNORMAL LOW (ref 12.0–15.0)
MCH: 30.3 pg (ref 26.0–34.0)
MCHC: 32.5 g/dL (ref 30.0–36.0)
MCV: 93 fL (ref 80.0–100.0)
Platelets: 331 K/uL (ref 150–400)
RBC: 3.57 MIL/uL — ABNORMAL LOW (ref 3.87–5.11)
RDW: 16.4 % — ABNORMAL HIGH (ref 11.5–15.5)
WBC: 6.1 K/uL (ref 4.0–10.5)
nRBC: 0 % (ref 0.0–0.2)

## 2023-09-26 LAB — TROPONIN I (HIGH SENSITIVITY)
Troponin I (High Sensitivity): 3 ng/L (ref <18)
Troponin I (High Sensitivity): 4 ng/L (ref <18)

## 2023-09-26 MED ORDER — APIXABAN (ELIQUIS) VTE STARTER PACK (10MG AND 5MG): ORAL_TABLET | ORAL | 0 refills | Status: DC

## 2023-09-26 MED ORDER — GABAPENTIN 300 MG PO CAPS
600.0000 mg | ORAL_CAPSULE | Freq: Once | ORAL | Status: AC
  Administered 2023-09-26: 600 mg via ORAL
  Filled 2023-09-26: qty 2

## 2023-09-26 MED ORDER — ONDANSETRON 4 MG PO TBDP
4.0000 mg | ORAL_TABLET | Freq: Once | ORAL | Status: AC
  Administered 2023-09-26: 4 mg via ORAL
  Filled 2023-09-26: qty 1

## 2023-09-26 MED ORDER — APIXABAN 5 MG PO TABS
10.0000 mg | ORAL_TABLET | Freq: Two times a day (BID) | ORAL | Status: AC
  Administered 2023-09-26: 10 mg via ORAL
  Filled 2023-09-26: qty 2

## 2023-09-26 MED ORDER — IOHEXOL 350 MG/ML SOLN
75.0000 mL | Freq: Once | INTRAVENOUS | Status: AC | PRN
  Administered 2023-09-26: 75 mL via INTRAVENOUS

## 2023-09-26 MED ORDER — OXYCODONE HCL 5 MG PO TABS
15.0000 mg | ORAL_TABLET | Freq: Once | ORAL | Status: AC
  Administered 2023-09-26: 15 mg via ORAL
  Filled 2023-09-26: qty 3

## 2023-09-26 NOTE — ED Provider Notes (Signed)
 [place] EMERGENCY DEPARTMENT AT [facility] Provider Note   CSN: [id number] Arrival date & time: 09/26/23  1250     Patient presents with: Chest Pain and Shortness of Breath   Cassidy [name] is a 58 y.o. female who presents to the ED with cc of cp. Patient is currently on Hospice for stage IV Lung cancer who presents for chief complaint of chest pain.  She reports that around 6 AM she woke up and she was doing well but then had onset of sharp, intermittent left-sided chest pain worse with breathing.  She has a history of coronary artery disease and end-stage lung cancer.  She is chronic lower extremity edema states that her left leg is usually more swollen than the right however today she noticed that her right leg is much more swollen.  She denies fevers chills or cough.  She is on chronic morphine  with her hospice care.  She denies hemoptysis or fever.     Chest Pain Associated symptoms: shortness of breath   Shortness of Breath Associated symptoms: chest pain        Prior to Admission medications   Medication Sig Start Date End Date Taking? Authorizing Provider  aspirin  EC 81 MG tablet Take 81 mg by mouth daily.    [provider]  atorvastatin  (LIPITOR) 40 MG tablet TAKE 1 TABLET (40 MG TOTAL) BY MOUTH DAILY. 04/23/22   Espinoza, Alejandra, DO  busPIRone  (BUSPAR ) 15 MG tablet Take 0.5 tablets (7.5 mg total) by mouth 2 (two) times daily. 05/20/22   Espinoza, Alejandra, DO  Carboxymethylcellul-Glycerin (LUBRICATING EYE DROPS OP) Place 1 drop into both eyes daily as needed (dry eyes).    [provider]  carvedilol  (COREG ) 6.25 MG tablet Take 2 tablets (12.5 mg total) by mouth 2 (two) times daily with a meal. 03/29/23   Bryan Bianchi, MD  diclofenac  Sodium (VOLTAREN ) 1 % GEL Apply 1 application topically 3 times daily as needed for pain 12/28/22   Nicholas Bar, MD  docusate sodium  (COLACE) 100 MG capsule Take 1 capsule (100 mg total) by mouth daily.  11/22/22   Nicholas Bar, MD  DULoxetine  (CYMBALTA ) 30 MG capsule Take 1 capsule (30 mg total) by mouth daily. 06/23/22   Leigh Venetia CROME, MD  fentaNYL  (DURAGESIC ) 25 MCG/HR Place 1 patch onto the skin every 3 (three) days. 06/08/23   Pickenpack-Cousar, Fannie SAILOR, NP  ferrous sulfate  325 (65 FE) MG tablet Take 1 tablet (325 mg total) by mouth daily with breakfast. 11/22/22   Nicholas Bar, MD  fluticasone  (FLONASE ) 50 MCG/ACT nasal spray PLACE 1 SPRAY INTO BOTH NOSTRILS DAILY AS NEEDED (CONGESTION). 02/23/23   Tharon Lung, MD  folic acid  (FOLVITE ) 1 MG tablet TAKE 1 TABLET (1 MG TOTAL) BY MOUTH DAILY. 12/20/22   Sherrod Sherrod, MD  gabapentin  (NEURONTIN ) 300 MG capsule Take 2 capsules (600 mg total) by mouth 3 (three) times daily. 06/08/23   Pickenpack-Cousar, Athena N, NP  glucose blood (ACCU-CHEK AVIVA PLUS) test strip USE AS INSTRUCTED EVERY DAY 03/23/23   Delores Suzann HERO, MD  hyoscyamine  (LEVSIN  SL) 0.125 MG SL tablet Place 1 tablet (0.125 mg total) under the tongue every 6 (six) hours as needed. For abdominal cramping 03/10/22   Heilingoetter, Cassandra L, PA-C  insulin  glargine (LANTUS ) 100 UNIT/ML Solostar Pen Inject 24 Units into the skin every morning. 05/03/22   Espinoza, Alejandra, DO  Insulin  Pen Needle 32G X 4 MM MISC 1 Needle by Does not apply route in the  morning and at bedtime. 02/02/22   Espinoza, Alejandra, DO  ipratropium (ATROVENT  HFA) 17 MCG/ACT inhaler Inhale 2 puffs into the lungs every 4 (four) hours as needed for wheezing (COPD).    [provider]  lactulose  (CHRONULAC ) 10 GM/15ML solution Take 15 mLs (10 g total) by mouth daily as needed for moderate constipation or severe constipation. 01/28/23   Shitarev, Dimitry, MD  lidocaine -prilocaine  (EMLA ) cream APPLY 1 APPLICATION TOPICALLY AS NEEDED. 02/01/22   Heilingoetter, Cassandra L, PA-C  LORazepam  (ATIVAN ) 0.5 MG tablet Take 1 tablet (0.5 mg total) by mouth every 8 (eight) hours as needed for anxiety (nausea). 01/19/23    Pickenpack-Cousar, Athena N, NP  metoCLOPramide  (REGLAN ) 10 MG tablet Take 1 tablet (10 mg total) by mouth every 8 (eight) hours as needed for nausea. 10/21/22   Pickenpack-Cousar, Athena N, NP  mometasone -formoterol  (DULERA ) 100-5 MCG/ACT AERO Inhale 2 puffs into the lungs in the morning and at bedtime. 11/22/22   Nicholas Bar, MD  nystatin  (MYCOSTATIN ) 100000 UNIT/ML suspension Take 5 mLs (500,000 Units total) by mouth 4 (four) times daily. 03/31/23   Pickenpack-Cousar, Athena N, NP  ondansetron  (ZOFRAN -ODT) 8 MG disintegrating tablet Take 1 tablet (8 mg total) by mouth every 8 (eight) hours as needed for nausea or vomiting. 01/28/22   Heilingoetter, Cassandra L, PA-C  oxyCODONE  (ROXICODONE ) 15 MG immediate release tablet Take 1-2 tablets (15-30 mg total) by mouth every 4 (four) hours as needed for pain. 06/08/23   Pickenpack-Cousar, Athena N, NP  pantoprazole  (PROTONIX ) 40 MG tablet Take 1 tablet (40 mg total) by mouth 2 (two) times daily. 06/23/22   Zehr, Jessica D, PA-C  polyethylene glycol powder (GAVILAX) 17 GM/SCOOP powder Take 17 g by mouth daily. 11/22/22   Nicholas Bar, MD  spironolactone  (ALDACTONE ) 50 MG tablet Take 1 tablet (50 mg total) by mouth at bedtime. 01/14/23   Dahbura, Anton, DO  torsemide  (DEMADEX ) 20 MG tablet Take 2 tablets (40 mg total) by mouth daily. 06/22/23   Bryan Bianchi, MD  valACYclovir  (VALTREX ) 1000 MG tablet Take for 3 days prn each outbreak. 07/08/22   Rudy Carlin LABOR, MD    Allergies: Amoxil  [amoxicillin ], Chantix [varenicline], Suboxone [buprenorphine hcl-naloxone hcl], and Emend [fosaprepitant  dimeglumine]    Review of Systems  Respiratory:  Positive for shortness of breath.   Cardiovascular:  Positive for chest pain.    Updated Vital Signs BP (!) 131/93 (BP Location: Right Arm)   Pulse 84   Temp 98 F (36.7 C) (Oral)   LMP 06/12/2016 (Approximate)   SpO2 100%   Physical Exam Vitals and nursing note reviewed.  Constitutional:      General: She  is not in acute distress.    Appearance: She is well-developed. She is not diaphoretic.  HENT:     Head: Normocephalic and atraumatic.     Right Ear: External ear normal.     Left Ear: External ear normal.     Nose: Nose normal.     Mouth/Throat:     Mouth: Mucous membranes are moist.  Eyes:     General: No scleral icterus.    Conjunctiva/sclera: Conjunctivae normal.  Cardiovascular:     Rate and Rhythm: Normal rate and regular rhythm.     Heart sounds: Normal heart sounds. No murmur heard.    No friction rub. No gallop.  Pulmonary:     Effort: Pulmonary effort is normal. No respiratory distress.     Breath sounds: Normal breath sounds.  Abdominal:     General:  Bowel sounds are normal. There is no distension.     Palpations: Abdomen is soft. There is no mass.     Tenderness: There is no abdominal tenderness. There is no guarding.  Musculoskeletal:     Cervical back: Normal range of motion.     Right lower leg: Edema present.     Left lower leg: Edema present.     Comments: Bilateral pitting edema right leg about 1-1/2 times the size of the left leg  Skin:    General: Skin is warm and dry.  Neurological:     Mental Status: She is alert and oriented to person, place, and time.  Psychiatric:        Behavior: Behavior normal.     (all labs ordered are listed, but only abnormal results are displayed) Labs Reviewed - No data to display  EKG: None  Radiology: No results found.   Procedures   Medications Ordered in the ED - No data to display  Clinical Course as of 09/26/23 1556  Mon Sep 26, 2023  1518 58 y.o. on hospice for stage IV lung cancer. Came in for chest pain that started shortly after waking. Left sided stabbing pain. Bilateral lower extremity edema worse on the right. Follow up CT PE and have goals of care discussion with her given that she is on hospice [AF]    Clinical Course User Index [AF] Dionisio Blunt, MD                                  Medical Decision Making Amount and/or Complexity of Data Reviewed Labs: ordered. Radiology: ordered.  Risk Prescription drug management.  This patient presents to the ED for concern of chest pain , this involves an extensive number of treatment options, and is a complaint that carries with it a high risk of complications and morbidity.  The emergent differential diagnosis of chest pain includes: Acute coronary syndrome, pericarditis, aortic dissection, pulmonary embolism, tension pneumothorax, pneumonia, and esophageal rupture.   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  s/p PCTA to dRCA and ostial RPAV-PLA vessel + PDA branch (05/2012)  b. [place]  s/p DES to mLAD - Resolute DES 3.0 x 22 (3.21mm -->3.3 mm)   Bipolar depression (HCC)    Breast abscess    a. right side.    COPD (chronic obstructive pulmonary disease) (HCC)    Depression    Diabetic peripheral neuropathy (HCC)    Fibromyalgia    Genital warts    GERD (gastroesophageal reflux disease)    History of hiatal hernia    HLD (hyperlipidemia)    Hypertension    Lung cancer (HCC)    Pain in limb    a. LE VENOUS DUPLEX, 02/05/2009 - no evidence of deep vein thrombosis, Baker's cyst   Pneumonia    PTSD (post-traumatic stress disorder)    Sickle cell trait (HCC)    Tobacco abuse    Tooth caries    Type II diabetes mellitus (HCC)      Social Determinants of Health:   SDOH Screenings   Food Insecurity: No Food Insecurity (08/04/2023)  Housing: Low Risk  (08/04/2023)  Transportation Needs: No Transportation Needs (08/04/2023)  Utilities:  Not At Risk (08/04/2023)  Depression (PHQ2-9): Low Risk  (03/29/2023)  Recent Concern: Depression (PHQ2-9) - Medium Risk (01/14/2023)  Financial Resource Strain: High Risk (09/08/2021)   Tobacco Use: Medium Risk (09/26/2023)     Additional history:  {Additional history obtained from emr   Lab Tests:  I Ordered, and personally interpreted labs.  The pertinent results include:   Initial blood sugar 66 given food with improvement.  Troponin within normal limits, CBC with mild anemia, CMP without significant abnormality.  Imaging Studies:  I ordered imaging studies including chest x-ray I independently visualized and interpreted imaging which showed no acute findings I agree with the radiologist interpretation  Cardiac Monitoring/ECG:  The patient was maintained on a cardiac monitor.  I personally viewed and interpreted the cardiac monitored which showed an underlying rhythm of: nsr   Test Considered:    Critical Interventions:    Consultations Obtained:   Problem List / ED Course:  No diagnosis found.  MDM: Patient here with leg swelling, chest pain.  Symptoms are very atypical for ACS.  CT angiogram pending.  Signout given to Dr. Dionisio at shift handoff.        Final diagnoses:  None    ED Discharge Orders     None          Arloa Chroman, PA-C 09/26/23 1604    Ula Prentice SAUNDERS, MD 09/26/23 [phone]

## 2023-09-26 NOTE — Progress Notes (Signed)
 RLE venous duplex has been completed.  Preliminary results given to Dr. Simon.   Results can be found under chart review under CV PROC. 09/26/2023 6:27 PM Camary Sosa RVT, RDMS

## 2023-09-26 NOTE — ED Provider Notes (Signed)
  Physical Exam  BP 136/88   Pulse 70   Temp 98.2 F (36.8 C) (Oral)   Resp 10   LMP 06/12/2016 (Approximate)   SpO2 100%   Physical Exam  Procedures  Procedures  ED Course / MDM   Clinical Course as of 09/26/23 1940  Mon Sep 26, 2023  1518 58 y.o. on hospice for stage IV lung cancer. Came in for chest pain that started shortly after waking. Left sided stabbing pain. Bilateral lower extremity edema worse on the right. Follow up CT PE and have goals of care discussion with her given that she is on hospice [AF]  1826 Ms. Saperstein is positive for DVT in her RLE CFV & SFJ.  It's non-occlusive and has the appearance some broke off. [TL]    Clinical Course User Index [AF] Dionisio Blunt, MD [TL] Simon Lavonia SAILOR, MD   I reevaluated the patient at bedside.  I reviewed her DVT study which showed an acute DVT of the right common femoral vein and saphenofemoral junction.  Patient has palpable DP pulses bilaterally.  Her CT PE showed a questionable small filling defect in her right sided pulmonary artery without definitive pulmonary embolism.  Patient remains breathing well on room air.  She is not hypoxic.  She has no tachypnea.  She has no tachycardia.  Patient is a hospice at home patient.  After discussion, she would like to go home on Eliquis .  She has full capacity make this decision.  I believe this is reasonable.  Patient given her first dose of Eliquis  in the emergency department discharged home with Eliquis   Medical Decision Making Problems Addressed: Acute deep vein thrombosis (DVT) of femoral vein of right lower extremity (HCC): complicated acute illness or injury that poses a threat to life or bodily functions  Amount and/or Complexity of Data Reviewed Labs: ordered. Decision-making details documented in ED Course. Radiology: ordered and independent interpretation performed. Decision-making details documented in ED Course.  Risk Prescription drug management. Decision regarding  hospitalization.   Dispo: Discharge        Dionisio Blunt, MD 09/26/23 1940    Simon Lavonia SAILOR, MD 09/26/23 2233

## 2023-09-26 NOTE — Discharge Instructions (Addendum)
 You were seen in the emergency department today for chest pain and right leg swelling.  While you were here we found that you have a blood clot in your right leg.  We did a CT scan of your lung which showed a possible tiny blood clot but nothing definitive.  We discussed hospitalization versus outpatient management.  After discussion, you would like to go home and this is reasonable given that you are on hospice at home.  I will send you home with Eliquis .  Please take 10 mg twice a day for the first 7 days followed by 5 mg twice a day after that.  Follow-up with your primary care provider.  Come back to the emergency department if you have worsening chest pain, shortness of breath, weakness on one side of the body or if you have any other reason to believe you need emergency care  Information on my medicine - ELIQUIS  (apixaban )  This medication education was reviewed with me or my healthcare representative as part of my discharge preparation.    Why was Eliquis  prescribed for you? Eliquis  was prescribed to treat blood clots that may have been found in the veins of your legs (deep vein thrombosis) or in your lungs (pulmonary embolism) and to reduce the risk of them occurring again.  What do You need to know about Eliquis  ? The starting dose is 10 mg (two 5 mg tablets) taken TWICE daily for the FIRST SEVEN (7) DAYS, then on (enter date) the dose is reduced to ONE 5 mg tablet taken TWICE daily.  Eliquis  may be taken with or without food.   Try to take the dose about the same time in the morning and in the evening. If you have difficulty swallowing the tablet whole please discuss with your pharmacist how to take the medication safely.  Take Eliquis  exactly as prescribed and DO NOT stop taking Eliquis  without talking to the doctor who prescribed the medication.  Stopping may increase your risk of developing a new blood clot.  Refill your prescription before you run out.  After discharge, you  should have regular check-up appointments with your healthcare provider that is prescribing your Eliquis .    What do you do if you miss a dose? If a dose of ELIQUIS  is not taken at the scheduled time, take it as soon as possible on the same day and twice-daily administration should be resumed. The dose should not be doubled to make up for a missed dose.  Important Safety Information A possible side effect of Eliquis  is bleeding. You should call your healthcare provider right away if you experience any of the following: Bleeding from an injury or your nose that does not stop. Unusual colored urine (red or dark brown) or unusual colored stools (red or black). Unusual bruising for unknown reasons. A serious fall or if you hit your head (even if there is no bleeding).  Some medicines may interact with Eliquis  and might increase your risk of bleeding or clotting while on Eliquis . To help avoid this, consult your healthcare provider or pharmacist prior to using any new prescription or non-prescription medications, including herbals, vitamins, non-steroidal anti-inflammatory drugs (NSAIDs) and supplements.  This website has more information on Eliquis  (apixaban ): http://[email or web address] .com/eliquis dena

## 2023-09-26 NOTE — ED Triage Notes (Addendum)
 Pt BIB EMS from home around 0800 started having chest pain, weakness. Pain has gotten worse 8/10, non radiating but on left side. Pt has hx of stents and MI, pt is on hospice for stage IV lung cancer. 12 lead unremarkable with EMS. 2 nitroglycerin , 324 ASA, 4mg  Zofran  with EMS

## 2023-09-28 ENCOUNTER — Inpatient Hospital Stay (HOSPITAL_COMMUNITY)
Admission: EM | Admit: 2023-09-28 | Discharge: 2023-09-30 | DRG: 081 | Disposition: A | Discharge status: Hospice | Attending: Family Medicine | Admitting: Family Medicine

## 2023-09-28 ENCOUNTER — Other Ambulatory Visit: Payer: Self-pay

## 2023-09-28 ENCOUNTER — Emergency Department (HOSPITAL_COMMUNITY)

## 2023-09-28 ENCOUNTER — Encounter (HOSPITAL_COMMUNITY): Payer: Self-pay

## 2023-09-28 DIAGNOSIS — Z7901 Long term (current) use of anticoagulants: Secondary | ICD-10-CM

## 2023-09-28 DIAGNOSIS — R251 Tremor, unspecified: Secondary | ICD-10-CM | POA: Diagnosis not present

## 2023-09-28 DIAGNOSIS — Z79899 Other long term (current) drug therapy: Secondary | ICD-10-CM

## 2023-09-28 DIAGNOSIS — Z7951 Long term (current) use of inhaled steroids: Secondary | ICD-10-CM

## 2023-09-28 DIAGNOSIS — Z833 Family history of diabetes mellitus: Secondary | ICD-10-CM

## 2023-09-28 DIAGNOSIS — Z789 Other specified health status: Secondary | ICD-10-CM

## 2023-09-28 DIAGNOSIS — C7951 Secondary malignant neoplasm of bone: Secondary | ICD-10-CM | POA: Diagnosis not present

## 2023-09-28 DIAGNOSIS — Z95828 Presence of other vascular implants and grafts: Secondary | ICD-10-CM

## 2023-09-28 DIAGNOSIS — C7931 Secondary malignant neoplasm of brain: Secondary | ICD-10-CM | POA: Diagnosis not present

## 2023-09-28 DIAGNOSIS — R531 Weakness: Secondary | ICD-10-CM

## 2023-09-28 DIAGNOSIS — C3491 Malignant neoplasm of unspecified part of right bronchus or lung: Secondary | ICD-10-CM | POA: Diagnosis present

## 2023-09-28 DIAGNOSIS — Z888 Allergy status to other drugs, medicaments and biological substances status: Secondary | ICD-10-CM

## 2023-09-28 DIAGNOSIS — R188 Other ascites: Secondary | ICD-10-CM | POA: Diagnosis present

## 2023-09-28 DIAGNOSIS — Z881 Allergy status to other antibiotic agents status: Secondary | ICD-10-CM

## 2023-09-28 DIAGNOSIS — F32A Depression, unspecified: Secondary | ICD-10-CM | POA: Diagnosis present

## 2023-09-28 DIAGNOSIS — E119 Type 2 diabetes mellitus without complications: Secondary | ICD-10-CM

## 2023-09-28 DIAGNOSIS — K219 Gastro-esophageal reflux disease without esophagitis: Secondary | ICD-10-CM | POA: Diagnosis present

## 2023-09-28 DIAGNOSIS — Z66 Do not resuscitate: Secondary | ICD-10-CM | POA: Diagnosis present

## 2023-09-28 DIAGNOSIS — C3490 Malignant neoplasm of unspecified part of unspecified bronchus or lung: Secondary | ICD-10-CM | POA: Diagnosis present

## 2023-09-28 DIAGNOSIS — Z951 Presence of aortocoronary bypass graft: Secondary | ICD-10-CM

## 2023-09-28 DIAGNOSIS — Z86718 Personal history of other venous thrombosis and embolism: Secondary | ICD-10-CM

## 2023-09-28 DIAGNOSIS — J4489 Other specified chronic obstructive pulmonary disease: Secondary | ICD-10-CM | POA: Diagnosis present

## 2023-09-28 DIAGNOSIS — F419 Anxiety disorder, unspecified: Secondary | ICD-10-CM | POA: Diagnosis present

## 2023-09-28 DIAGNOSIS — I2510 Atherosclerotic heart disease of native coronary artery without angina pectoris: Secondary | ICD-10-CM | POA: Diagnosis present

## 2023-09-28 DIAGNOSIS — I110 Hypertensive heart disease with heart failure: Secondary | ICD-10-CM | POA: Diagnosis present

## 2023-09-28 DIAGNOSIS — I82401 Acute embolism and thrombosis of unspecified deep veins of right lower extremity: Secondary | ICD-10-CM | POA: Diagnosis present

## 2023-09-28 DIAGNOSIS — Z9221 Personal history of antineoplastic chemotherapy: Secondary | ICD-10-CM

## 2023-09-28 DIAGNOSIS — R180 Malignant ascites: Secondary | ICD-10-CM | POA: Diagnosis present

## 2023-09-28 DIAGNOSIS — Z800 Family history of malignant neoplasm of digestive organs: Secondary | ICD-10-CM

## 2023-09-28 DIAGNOSIS — Z825 Family history of asthma and other chronic lower respiratory diseases: Secondary | ICD-10-CM

## 2023-09-28 DIAGNOSIS — C799 Secondary malignant neoplasm of unspecified site: Secondary | ICD-10-CM | POA: Diagnosis present

## 2023-09-28 DIAGNOSIS — Z923 Personal history of irradiation: Secondary | ICD-10-CM

## 2023-09-28 DIAGNOSIS — M797 Fibromyalgia: Secondary | ICD-10-CM | POA: Diagnosis present

## 2023-09-28 DIAGNOSIS — F17210 Nicotine dependence, cigarettes, uncomplicated: Secondary | ICD-10-CM | POA: Diagnosis present

## 2023-09-28 DIAGNOSIS — E1142 Type 2 diabetes mellitus with diabetic polyneuropathy: Secondary | ICD-10-CM | POA: Diagnosis present

## 2023-09-28 DIAGNOSIS — Z794 Long term (current) use of insulin: Secondary | ICD-10-CM

## 2023-09-28 DIAGNOSIS — I5032 Chronic diastolic (congestive) heart failure: Secondary | ICD-10-CM | POA: Diagnosis present

## 2023-09-28 DIAGNOSIS — D573 Sickle-cell trait: Secondary | ICD-10-CM | POA: Diagnosis present

## 2023-09-28 DIAGNOSIS — Z8249 Family history of ischemic heart disease and other diseases of the circulatory system: Secondary | ICD-10-CM

## 2023-09-28 DIAGNOSIS — I824Y9 Acute embolism and thrombosis of unspecified deep veins of unspecified proximal lower extremity: Secondary | ICD-10-CM

## 2023-09-28 DIAGNOSIS — Z955 Presence of coronary angioplasty implant and graft: Secondary | ICD-10-CM

## 2023-09-28 DIAGNOSIS — E785 Hyperlipidemia, unspecified: Secondary | ICD-10-CM | POA: Diagnosis present

## 2023-09-28 DIAGNOSIS — Z807 Family history of other malignant neoplasms of lymphoid, hematopoietic and related tissues: Secondary | ICD-10-CM

## 2023-09-28 DIAGNOSIS — G936 Cerebral edema: Principal | ICD-10-CM | POA: Diagnosis present

## 2023-09-28 DIAGNOSIS — R220 Localized swelling, mass and lump, head: Secondary | ICD-10-CM | POA: Diagnosis not present

## 2023-09-28 DIAGNOSIS — E876 Hypokalemia: Secondary | ICD-10-CM | POA: Diagnosis present

## 2023-09-28 DIAGNOSIS — Z8349 Family history of other endocrine, nutritional and metabolic diseases: Secondary | ICD-10-CM

## 2023-09-28 DIAGNOSIS — G8193 Hemiplegia, unspecified affecting right nondominant side: Secondary | ICD-10-CM | POA: Insufficient documentation

## 2023-09-28 DIAGNOSIS — R634 Abnormal weight loss: Secondary | ICD-10-CM | POA: Diagnosis present

## 2023-09-28 LAB — URINALYSIS, ROUTINE W REFLEX MICROSCOPIC
Bilirubin Urine: NEGATIVE
Glucose, UA: NEGATIVE mg/dL
Hgb urine dipstick: NEGATIVE
Ketones, ur: NEGATIVE mg/dL
Leukocytes,Ua: NEGATIVE
Nitrite: NEGATIVE
Protein, ur: NEGATIVE mg/dL
Specific Gravity, Urine: 1.006 (ref 1.005–1.030)
pH: 5 (ref 5.0–8.0)

## 2023-09-28 LAB — BASIC METABOLIC PANEL WITH GFR
Anion gap: 13 (ref 5–15)
BUN: 10 mg/dL (ref 6–20)
CO2: 22 mmol/L (ref 22–32)
Calcium: 8.1 mg/dL — ABNORMAL LOW (ref 8.9–10.3)
Chloride: 103 mmol/L (ref 98–111)
Creatinine, Ser: 1.01 mg/dL — ABNORMAL HIGH (ref 0.44–1.00)
GFR, Estimated: >60 mL/min (ref >60)
Glucose, Bld: 107 mg/dL — ABNORMAL HIGH (ref 70–99)
Potassium: 3.3 mmol/L — ABNORMAL LOW (ref 3.5–5.1)
Sodium: 138 mmol/L (ref 135–145)

## 2023-09-28 LAB — GLUCOSE, CAPILLARY
Glucose-Capillary: 95 mg/dL (ref 70–99)
Glucose-Capillary: 247 mg/dL — ABNORMAL HIGH (ref 70–99)

## 2023-09-28 LAB — CBC
HCT: 35.1 % — ABNORMAL LOW (ref 36.0–46.0)
Hemoglobin: 11.5 g/dL — ABNORMAL LOW (ref 12.0–15.0)
MCH: 29.8 pg (ref 26.0–34.0)
MCHC: 32.8 g/dL (ref 30.0–36.0)
MCV: 90.9 fL (ref 80.0–100.0)
Platelets: 367 K/uL (ref 150–400)
RBC: 3.86 MIL/uL — ABNORMAL LOW (ref 3.87–5.11)
RDW: 16.2 % — ABNORMAL HIGH (ref 11.5–15.5)
WBC: 8.2 K/uL (ref 4.0–10.5)
nRBC: 0 % (ref 0.0–0.2)

## 2023-09-28 MED ORDER — ONDANSETRON 4 MG PO TBDP
8.0000 mg | ORAL_TABLET | Freq: Three times a day (TID) | ORAL | Status: DC | PRN
  Administered 2023-09-29 (×2): 8 mg via ORAL
  Filled 2023-09-28 (×2): qty 2

## 2023-09-28 MED ORDER — DICLOFENAC SODIUM 1 % EX GEL
4.0000 g | Freq: Four times a day (QID) | CUTANEOUS | Status: DC
Start: 2023-09-28 — End: 2023-09-30
  Administered 2023-09-29 – 2023-09-30 (×2): 4 g via TOPICAL
  Filled 2023-09-28: qty 100

## 2023-09-28 MED ORDER — POTASSIUM CHLORIDE CRYS ER 20 MEQ PO TBCR
40.0000 meq | EXTENDED_RELEASE_TABLET | Freq: Once | ORAL | Status: AC
  Administered 2023-09-28: 40 meq via ORAL
  Filled 2023-09-28: qty 2

## 2023-09-28 MED ORDER — LORAZEPAM 0.5 MG PO TABS
0.5000 mg | ORAL_TABLET | Freq: Three times a day (TID) | ORAL | Status: DC | PRN
Start: 2023-09-28 — End: 2023-09-30
  Administered 2023-09-29: 0.5 mg via ORAL
  Filled 2023-09-28: qty 1

## 2023-09-28 MED ORDER — APIXABAN 5 MG PO TABS: 5.0000 mg | ORAL_TABLET | Freq: Two times a day (BID) | ORAL | Status: DC

## 2023-09-28 MED ORDER — LIDOCAINE-PRILOCAINE 2.5-2.5 % EX CREA
1.0000 | TOPICAL_CREAM | CUTANEOUS | Status: DC | PRN
Start: 2023-09-28 — End: 2023-09-30
  Filled 2023-09-28: qty 5

## 2023-09-28 MED ORDER — GABAPENTIN 300 MG PO CAPS
600.0000 mg | ORAL_CAPSULE | Freq: Three times a day (TID) | ORAL | Status: DC
  Administered 2023-09-28 – 2023-09-30 (×6): 600 mg via ORAL
  Filled 2023-09-28 (×8): qty 2

## 2023-09-28 MED ORDER — FLUTICASONE PROPIONATE 50 MCG/ACT NA SUSP: 1.0000 | Freq: Every day | NASAL | Status: DC | PRN

## 2023-09-28 MED ORDER — DOCUSATE SODIUM 100 MG PO CAPS
100.0000 mg | ORAL_CAPSULE | Freq: Every day | ORAL | Status: DC
  Administered 2023-09-28 – 2023-09-30 (×3): 100 mg via ORAL
  Filled 2023-09-28 (×3): qty 1

## 2023-09-28 MED ORDER — FERROUS SULFATE 325 (65 FE) MG PO TABS
325.0000 mg | ORAL_TABLET | Freq: Every day | ORAL | Status: DC
Start: 2023-09-29 — End: 2023-09-30
  Administered 2023-09-29 – 2023-09-30 (×2): 325 mg via ORAL
  Filled 2023-09-28 (×2): qty 1

## 2023-09-28 MED ORDER — METOCLOPRAMIDE HCL 10 MG PO TABS
10.0000 mg | ORAL_TABLET | Freq: Three times a day (TID) | ORAL | Status: DC | PRN
Start: 2023-09-28 — End: 2023-09-30

## 2023-09-28 MED ORDER — LACTULOSE 10 GM/15ML PO SOLN: 10.0000 g | Freq: Every day | ORAL | Status: DC | PRN

## 2023-09-28 MED ORDER — APIXABAN 5 MG PO TABS
10.0000 mg | ORAL_TABLET | Freq: Two times a day (BID) | ORAL | Status: DC
  Administered 2023-09-28 – 2023-09-30 (×4): 10 mg via ORAL
  Filled 2023-09-28 (×6): qty 2

## 2023-09-28 MED ORDER — ATORVASTATIN CALCIUM 40 MG PO TABS
40.0000 mg | ORAL_TABLET | Freq: Every day | ORAL | Status: DC
  Administered 2023-09-28 – 2023-09-30 (×3): 40 mg via ORAL
  Filled 2023-09-28 (×3): qty 1

## 2023-09-28 MED ORDER — PANTOPRAZOLE SODIUM 40 MG PO TBEC
40.0000 mg | DELAYED_RELEASE_TABLET | Freq: Two times a day (BID) | ORAL | Status: DC
  Administered 2023-09-28 – 2023-09-30 (×4): 40 mg via ORAL
  Filled 2023-09-28 (×7): qty 1

## 2023-09-28 MED ORDER — DULOXETINE HCL 30 MG PO CPEP
30.0000 mg | ORAL_CAPSULE | Freq: Every day | ORAL | Status: DC
  Administered 2023-09-28 – 2023-09-30 (×3): 30 mg via ORAL
  Filled 2023-09-28 (×3): qty 1

## 2023-09-28 MED ORDER — HYOSCYAMINE SULFATE 0.125 MG SL SUBL
0.1250 mg | SUBLINGUAL_TABLET | Freq: Four times a day (QID) | SUBLINGUAL | Status: DC | PRN
Start: 2023-09-28 — End: 2023-09-30

## 2023-09-28 MED ORDER — FLUTICASONE FUROATE-VILANTEROL 100-25 MCG/ACT IN AEPB
1.0000 | INHALATION_SPRAY | Freq: Every day | RESPIRATORY_TRACT | Status: DC
  Administered 2023-09-28 – 2023-09-30 (×2): 1 via RESPIRATORY_TRACT
  Filled 2023-09-28: qty 28

## 2023-09-28 MED ORDER — OXYCODONE HCL 5 MG PO TABS
15.0000 mg | ORAL_TABLET | ORAL | Status: DC | PRN
  Administered 2023-09-28 – 2023-09-30 (×9): 15 mg via ORAL
  Filled 2023-09-28 (×9): qty 3

## 2023-09-28 MED ORDER — NYSTATIN 100000 UNIT/ML MT SUSP
5.0000 mL | Freq: Four times a day (QID) | OROMUCOSAL | Status: DC
  Administered 2023-09-28 – 2023-09-30 (×5): 500000 [IU] via ORAL
  Filled 2023-09-28 (×9): qty 5

## 2023-09-28 MED ORDER — ACETAMINOPHEN 500 MG PO TABS
500.0000 mg | ORAL_TABLET | Freq: Four times a day (QID) | ORAL | Status: DC
Start: 2023-09-28 — End: 2023-09-30
  Administered 2023-09-28 – 2023-09-30 (×7): 500 mg via ORAL
  Filled 2023-09-28 (×7): qty 1

## 2023-09-28 MED ORDER — DEXAMETHASONE SODIUM PHOSPHATE 10 MG/ML IJ SOLN
8.0000 mg | Freq: Once | INTRAMUSCULAR | Status: AC
  Administered 2023-09-29: 8 mg via INTRAVENOUS
  Filled 2023-09-28: qty 1

## 2023-09-28 MED ORDER — FOLIC ACID 1 MG PO TABS
1.0000 mg | ORAL_TABLET | Freq: Every day | ORAL | Status: DC
  Administered 2023-09-28 – 2023-09-30 (×3): 1 mg via ORAL
  Filled 2023-09-28 (×3): qty 1

## 2023-09-28 MED ORDER — INSULIN ASPART 100 UNIT/ML IJ SOLN
0.0000 [IU] | Freq: Three times a day (TID) | INTRAMUSCULAR | Status: DC
  Administered 2023-09-29: 3 [IU] via SUBCUTANEOUS
  Administered 2023-09-29: 5 [IU] via SUBCUTANEOUS
  Administered 2023-09-29 – 2023-09-30 (×4): 3 [IU] via SUBCUTANEOUS

## 2023-09-28 MED ORDER — CARVEDILOL 12.5 MG PO TABS
12.5000 mg | ORAL_TABLET | Freq: Two times a day (BID) | ORAL | Status: DC
Start: 2023-09-28 — End: 2023-09-30
  Administered 2023-09-28 – 2023-09-30 (×4): 12.5 mg via ORAL
  Filled 2023-09-28 (×5): qty 1

## 2023-09-28 MED ORDER — POLYVINYL ALCOHOL 1.4 % OP SOLN: 1.0000 [drp] | OPHTHALMIC | Status: DC | PRN

## 2023-09-28 MED ORDER — ENSURE PLUS HIGH PROTEIN PO LIQD
237.0000 mL | Freq: Two times a day (BID) | ORAL | Status: DC
  Administered 2023-09-29 – 2023-09-30 (×4): 237 mL via ORAL

## 2023-09-28 MED ORDER — POLYETHYLENE GLYCOL 3350 17 G PO PACK
17.0000 g | PACK | Freq: Every day | ORAL | Status: DC
  Administered 2023-09-28 – 2023-09-30 (×3): 17 g via ORAL
  Filled 2023-09-28 (×3): qty 1

## 2023-09-28 MED ORDER — TORSEMIDE 20 MG PO TABS
40.0000 mg | ORAL_TABLET | Freq: Every day | ORAL | Status: DC
  Administered 2023-09-28 – 2023-09-30 (×3): 40 mg via ORAL
  Filled 2023-09-28 (×3): qty 2

## 2023-09-28 MED ORDER — MORPHINE SULFATE (CONCENTRATE) 10 MG /0.5 ML PO SOLN
5.0000 mg | Freq: Four times a day (QID) | ORAL | Status: DC | PRN
  Administered 2023-09-29: 5 mg via ORAL
  Filled 2023-09-28: qty 0.5

## 2023-09-28 MED ORDER — POLYETHYL GLYCOL-PROPYL GLYCOL 0.4-0.3 % OP SOLN: Freq: Every day | OPHTHALMIC | Status: DC | PRN

## 2023-09-28 MED ORDER — DEXAMETHASONE SODIUM PHOSPHATE 10 MG/ML IJ SOLN
8.0000 mg | Freq: Once | INTRAMUSCULAR | Status: AC
  Administered 2023-09-28: 8 mg via INTRAVENOUS
  Filled 2023-09-28: qty 1

## 2023-09-28 MED ORDER — IPRATROPIUM BROMIDE 0.02 % IN SOLN
0.5000 mg | RESPIRATORY_TRACT | Status: DC | PRN
Start: 2023-09-28 — End: 2023-09-30

## 2023-09-28 MED ORDER — BUSPIRONE HCL 5 MG PO TABS
7.5000 mg | ORAL_TABLET | Freq: Two times a day (BID) | ORAL | Status: DC
  Administered 2023-09-28 – 2023-09-30 (×4): 7.5 mg via ORAL
  Filled 2023-09-28: qty 1.5
  Filled 2023-09-28: qty 2
  Filled 2023-09-28: qty 1
  Filled 2023-09-28: qty 2
  Filled 2023-09-28: qty 1.5

## 2023-09-28 MED ORDER — FENTANYL 25 MCG/HR TD PT72: 1.0000 | MEDICATED_PATCH | TRANSDERMAL | Status: DC

## 2023-09-28 NOTE — Assessment & Plan Note (Addendum)
 Diagnosed on 9/15 following US  done during ER visit. Prescribed Eliquis  10 mg BID for 7 days followed by 5 mg BID, which patient had not yet started. Of note, upon review of prior imaging from 9/15, CT PE with questionable very small pulmonary embolism versus artifact.  Already planning to treat with Eliquis , so additional imaging would not be beneficial at this time. - Starting Eliquis  10 mg twice daily for 7 days followed by 5 mg twice daily - Consider repeat CTA PE as clinically indicated

## 2023-09-28 NOTE — ED Notes (Signed)
 Pt power port accessed with H20 needle, new dressing applied, clean dry and intact, saline locked. Blood return noted and labs drawn.

## 2023-09-28 NOTE — Assessment & Plan Note (Addendum)
 Currently on hospice care.  Patient is thinking about pursuing further curative treatment for her condition.  Noticeable weakness on exam on the RLE. Will admit for further discussion/workup of condition and palliation. - EEG pending - Pain regimen: Tylenol  500 mg every 6 hours - Continuing home pain regimen of morphine  concentrate 5 mg every 6 as needed for moderate pain, oxycodone  15 mg q4h as needed for severe pain, fentanyl  25 mcg patch every 72 hours, gabapentin  600 mg 3 times daily, duloxetine  30 mg daily, hyoscyamine  0.125 mg q6h as needed, Voltaren  4 times daily - Bowel regimen: MiraLAX  17 g daily, lactulose  10 g daily as needed, Colace 100 mg daily - Nausea: Zofran -ODT 8 mg q8h as needed, Reglan  10 mg q8h as needed - Ativan  0.5 q8h as needed anxiety or nausea - Folic acid  1 mg daily, ferrous sulfate  325 mg daily - PT/OT eval - Fall and delirium precautions - Oncology consulted, appreciate recommendations - Palliative consulted, appreciate recommendations - Labs: TSH, RPR, HIV, vitamin B12 - AM labs: CMP, CBC

## 2023-09-28 NOTE — Assessment & Plan Note (Addendum)
 Home Lantus  dosing of 24 units daily.  Repeating A1c this admission. - Start sliding scale insulin  for now - Add back home Lantus  as appropriate given different eating habits in hospital

## 2023-09-28 NOTE — Assessment & Plan Note (Addendum)
 HTN: Continue home carvedilol  12.5 mg twice daily HLD: Continue home Lipitor 40 mg GERD: Continue home pantoprazole  40 mg twice daily COPD: Continue home Breo Ellipta  1 puff daily, ipratropium 0.5 mg every 4 hours as needed Depression/Anxiety: Continue home Buspar  7.5 3 times daily

## 2023-09-28 NOTE — ED Triage Notes (Addendum)
 Patient arrives via [facility] for leg pain with a known DVT. Dx on Monday. Patient was seen Monday and left AMA. Increased swelling and pain in right leg. New onset tremors that patient endorses. Patient alert oriented x4. Patient stroke screen negative. Only deficit is right leg dragging per patient and tremors. Patient endorses being prescribed a blood thinner and taking it Monday at the hospital but she hasn't been able to pick up her prescription yet.   EMS vitals 108/70 HR 80 99 on room air CBG 100

## 2023-09-28 NOTE — ED Notes (Signed)
MD at patient bedside

## 2023-09-28 NOTE — ED Provider Notes (Signed)
 [place] EMERGENCY DEPARTMENT AT [facility] Provider Note   CSN: [id number] Arrival date & time: 09/28/23  1117     Patient presents with: Leg Pain   Cassidy [name] is a 58 y.o. female.   58 year old female with recently diagnosed DVT as well as lung cancer on home hospice presenting to the emergency department today with weakness in her right lower extremity where she has the DVT as well as some tremors in her right upper and right lower extremity.  She reports this is new.  She denies any significant headache with this.  She states that she is having some numbness and tingling in her right arm and leg which is new as well.  This has been going on since Monday after she initially left the hospital.  She came to the emergency department for further evaluation regarding this.  She denies any chest pain or shortness of breath.   Leg Pain      Prior to Admission medications   Medication Sig Start Date End Date Taking? Authorizing Provider  atorvastatin  (LIPITOR) 40 MG tablet TAKE 1 TABLET (40 MG TOTAL) BY MOUTH DAILY. 04/23/22  Yes Espinoza, Alejandra, DO  busPIRone  (BUSPAR ) 15 MG tablet Take 0.5 tablets (7.5 mg total) by mouth 2 (two) times daily. 05/20/22  Yes Espinoza, Alejandra, DO  Carboxymethylcellul-Glycerin (LUBRICATING EYE DROPS OP) Place 1 drop into both eyes daily as needed (dry eyes).   Yes [provider]  carvedilol  (COREG ) 6.25 MG tablet Take 2 tablets (12.5 mg total) by mouth 2 (two) times daily with a meal. 03/29/23  Yes Bryan Bianchi, MD  diclofenac  Sodium (VOLTAREN ) 1 % GEL Apply 1 application topically 3 times daily as needed for pain 12/28/22  Yes Nicholas Bar, MD  docusate sodium  (COLACE) 100 MG capsule Take 1 capsule (100 mg total) by mouth daily. 11/22/22  Yes Nicholas Bar, MD  DULoxetine  (CYMBALTA ) 30 MG capsule Take 1 capsule (30 mg total) by mouth daily. 06/23/22  Yes Leigh Venetia CROME, MD  fentaNYL  (DURAGESIC ) 25 MCG/HR Place 1 patch onto  the skin every 3 (three) days. 06/08/23  Yes Pickenpack-Cousar, Fannie SAILOR, NP  ferrous sulfate  325 (65 FE) MG tablet Take 1 tablet (325 mg total) by mouth daily with breakfast. 11/22/22  Yes Nicholas Bar, MD  fluticasone  (FLONASE ) 50 MCG/ACT nasal spray PLACE 1 SPRAY INTO BOTH NOSTRILS DAILY AS NEEDED (CONGESTION). 02/23/23  Yes Mabe, Elna, MD  folic acid  (FOLVITE ) 1 MG tablet TAKE 1 TABLET (1 MG TOTAL) BY MOUTH DAILY. 12/20/22  Yes Sherrod Sherrod, MD  gabapentin  (NEURONTIN ) 300 MG capsule Take 2 capsules (600 mg total) by mouth 3 (three) times daily. 06/08/23  Yes Pickenpack-Cousar, Fannie SAILOR, NP  hyoscyamine  (LEVSIN  SL) 0.125 MG SL tablet Place 1 tablet (0.125 mg total) under the tongue every 6 (six) hours as needed. For abdominal cramping 03/10/22  Yes Heilingoetter, Cassandra L, PA-C  insulin  glargine (LANTUS ) 100 UNIT/ML Solostar Pen Inject 24 Units into the skin every morning. 05/03/22  Yes Espinoza, Alejandra, DO  ipratropium (ATROVENT  HFA) 17 MCG/ACT inhaler Inhale 2 puffs into the lungs every 4 (four) hours as needed for wheezing (COPD).   Yes [provider]  lactulose  (CHRONULAC ) 10 GM/15ML solution Take 15 mLs (10 g total) by mouth daily as needed for moderate constipation or severe constipation. 01/28/23  Yes Shitarev, Dimitry, MD  lidocaine -prilocaine  (EMLA ) cream APPLY 1 APPLICATION TOPICALLY AS NEEDED. 02/01/22  Yes Heilingoetter, Cassandra L, PA-C  LORazepam  (ATIVAN ) 0.5 MG tablet Take  1 tablet (0.5 mg total) by mouth every 8 (eight) hours as needed for anxiety (nausea). 01/19/23  Yes Pickenpack-Cousar, Athena N, NP  metoCLOPramide  (REGLAN ) 10 MG tablet Take 1 tablet (10 mg total) by mouth every 8 (eight) hours as needed for nausea. 10/21/22  Yes Pickenpack-Cousar, Fannie SAILOR, NP  mometasone -formoterol  (DULERA ) 100-5 MCG/ACT AERO Inhale 2 puffs into the lungs in the morning and at bedtime. 11/22/22  Yes Nicholas Bar, MD  Morphine  Sulfate (MORPHINE  CONCENTRATE) 10 mg / 0.5 ml  concentrated solution Take 0.25 mLs by mouth every 6 (six) hours as needed for moderate pain (pain score 4-6). 08/20/23  Yes [provider]  nystatin  (MYCOSTATIN ) 100000 UNIT/ML suspension Take 5 mLs (500,000 Units total) by mouth 4 (four) times daily. 03/31/23  Yes Pickenpack-Cousar, Fannie SAILOR, NP  ondansetron  (ZOFRAN -ODT) 8 MG disintegrating tablet Take 1 tablet (8 mg total) by mouth every 8 (eight) hours as needed for nausea or vomiting. 01/28/22  Yes Heilingoetter, Cassandra L, PA-C  oxyCODONE  (ROXICODONE ) 15 MG immediate release tablet Take 1-2 tablets (15-30 mg total) by mouth every 4 (four) hours as needed for pain. 06/08/23  Yes Pickenpack-Cousar, Fannie SAILOR, NP  pantoprazole  (PROTONIX ) 40 MG tablet Take 1 tablet (40 mg total) by mouth 2 (two) times daily. 06/23/22  Yes Zehr, Jessica D, PA-C  polyethylene glycol powder (GAVILAX) 17 GM/SCOOP powder Take 17 g by mouth daily. 11/22/22  Yes Nicholas Bar, MD  torsemide  (DEMADEX ) 20 MG tablet Take 2 tablets (40 mg total) by mouth daily. 06/22/23  Yes Bryan Bianchi, MD  valACYclovir  (VALTREX ) 1000 MG tablet Take for 3 days prn each outbreak. 07/08/22  Yes Rudy Carlin LABOR, MD  APIXABAN  (ELIQUIS ) VTE STARTER PACK (10MG  AND 5MG ) Take as directed on package: start with two-5mg  tablets twice daily for 7 days. On day 8, switch to one-5mg  tablet twice daily. Patient not taking: Reported on 09/28/2023 09/26/23   Dionisio Blunt, MD  aspirin  EC 81 MG tablet Take 81 mg by mouth daily. Patient not taking: Reported on 09/28/2023    [provider]  glucose blood (ACCU-CHEK AVIVA PLUS) test strip USE AS INSTRUCTED EVERY DAY 03/23/23   Delores Suzann HERO, MD  Insulin  Pen Needle 32G X 4 MM MISC 1 Needle by Does not apply route in the morning and at bedtime. 02/02/22   Espinoza, Alejandra, DO    Allergies: Amoxil  [amoxicillin ], Chantix [varenicline], Suboxone [buprenorphine hcl-naloxone hcl], and Emend [fosaprepitant  dimeglumine]    Review of Systems   Neurological:  Positive for weakness and numbness.  All other systems reviewed and are negative.   Updated Vital Signs BP 130/76   Pulse 85   Temp 98.2 F (36.8 C)   Resp 15   Ht 5' 7 (1.702 m)   Wt 72.6 kg   LMP 06/12/2016 (Approximate)   SpO2 100%   BMI 25.06 kg/m   Physical Exam Vitals and nursing note reviewed.   Gen: NAD Eyes: PERRL, EOMI HEENT: no oropharyngeal swelling Neck: trachea midline Resp: clear to auscultation bilaterally Card: RRR, no murmurs, rubs, or gallops Abd: nontender, nondistended Extremities: no calf tenderness, no edema Vascular: 2+ radial pulses bilaterally, 2+ DP pulses bilaterally Neuro: The patient reports diminished sensation over the right upper and right lower extremity compared to the left to palpation but seems to have equal strength, she does seem to have unilateral tremors in the right upper and right lower extremity.  Cranial nerves are intact. Skin: no rashes Psyc: acting appropriately   (all labs ordered are  listed, but only abnormal results are displayed) Labs Reviewed  BASIC METABOLIC PANEL WITH GFR - Abnormal; Notable for the following components:      Result Value   Potassium 3.3 (*)    Glucose, Bld 107 (*)    Creatinine, Ser 1.01 (*)    Calcium  8.1 (*)    All other components within normal limits  CBC - Abnormal; Notable for the following components:   RBC 3.86 (*)    Hemoglobin 11.5 (*)    HCT 35.1 (*)    RDW 16.2 (*)    All other components within normal limits  URINALYSIS, ROUTINE W REFLEX MICROSCOPIC - Abnormal; Notable for the following components:   Color, Urine STRAW (*)    All other components within normal limits    EKG: EKG Interpretation Date/Time:  Wednesday September 28 2023 12:12:41 EDT Ventricular Rate:  84 PR Interval:  168 QRS Duration:  68 QT Interval:  372 QTC Calculation: 439 R Axis:   31  Text Interpretation: Normal sinus rhythm Low voltage QRS Cannot rule out Anterior infarct , age  undetermined Abnormal ECG When compared with ECG of 26-Sep-2023 13:06, PREVIOUS ECG IS PRESENT Confirmed by Ula Barter [phone]) on 09/28/2023 12:21:10 PM  Radiology: MR BRAIN WO CONTRAST Result Date: 09/28/2023 CLINICAL DATA:  Neuro deficit, acute, stroke suspected. Right arm and leg tremors with difficulty walking. History of lung cancer. EXAM: MRI HEAD WITHOUT CONTRAST TECHNIQUE: Multiplanar, multiecho pulse sequences of the brain and surrounding structures were obtained without intravenous contrast. COMPARISON:  Head MRI 01/26/2022 FINDINGS: Brain: There is a new, approximately 5 cm mass in the left parietal lobe with prominent surrounding vasogenic edema resulting in regional mass effect including effacement of the posterior left lateral ventricle and trace rightward midline shift. A small amount of associated susceptibility and intrinsic T1 hyperintensity may reflect hemorrhage and/or mineralization. No mass or edema is identified elsewhere on this unenhanced study. No acute infarct, hydrocephalus, or extra-axial fluid collection is evident. Cerebral volume is normal for age. No significant chronic white matter disease is evident. Vascular: Major intracranial vascular flow voids are preserved. Skull and upper cervical spine: Unchanged 1.5 cm focus of FLAIR hyperintensity in the superior midline occipital bone at the site of a previously shown metastasis. Sinuses/Orbits: Unremarkable orbits. Mild mucosal thickening in the paranasal sinuses. Clear mastoid air cells. Other: None. IMPRESSION: New 5 cm left parietal mass with prominent edema most consistent with a metastasis. Postcontrast imaging is recommended for complete staging. Electronically Signed   By: Dasie Hamburg M.D.   On: 09/28/2023 15:37   VAS US  LOWER EXTREMITY VENOUS (DVT) (ONLY [facility] & [facility]) Result Date: 09/27/2023  Lower Venous DVT Study Patient Name:  JUSTIN BUECHNER  Date of Exam:   09/26/2023 Medical Rec #: [id number]        Accession #:     [id number] Date of Birth: 12-28-1965       Patient Gender: F Patient Age:   4 years Exam Location:  [facility] Procedure:      VAS US  LOWER EXTREMITY VENOUS (DVT) Referring Phys: ABIGAIL HARRIS --------------------------------------------------------------------------------  Indications: Swelling.  Risk Factors: Cancer - stage 4 metatstatic lung cancer. Limitations: Poor ultrasound/tissue interface. Comparison Study: Previous exam on 10/29/2022 was negative for DVT Performing Technologist: Ezzie Potters RVT, RDMS  Examination Guidelines: A complete evaluation includes B-mode imaging, spectral Doppler, color Doppler, and power Doppler as needed of all accessible portions of each vessel. Bilateral testing is considered an integral part of a complete  examination. Limited examinations for reoccurring indications may be performed as noted. The reflux portion of the exam is performed with the patient in reverse Trendelenburg.  +--------+---------------+---------+-----------+----------+--------------------+ RIGHT   CompressibilityPhasicitySpontaneityPropertiesThrombus Aging       +--------+---------------+---------+-----------+----------+--------------------+ CFV     Partial        Yes      Yes                  Acute                +--------+---------------+---------+-----------+----------+--------------------+ SFJ     Partial                                      Acute                +--------+---------------+---------+-----------+----------+--------------------+ FV Prox Full           Yes      Yes                                       +--------+---------------+---------+-----------+----------+--------------------+ FV Mid  Full           Yes      Yes                                       +--------+---------------+---------+-----------+----------+--------------------+ FV      Full           Yes      Yes                                       Distal                                                                     +--------+---------------+---------+-----------+----------+--------------------+ PFV     Full                                                              +--------+---------------+---------+-----------+----------+--------------------+ POP     Full           Yes      Yes                                       +--------+---------------+---------+-----------+----------+--------------------+ PTV                                                  Not well visualized  +--------+---------------+---------+-----------+----------+--------------------+ PERO    Full                                                              +--------+---------------+---------+-----------+----------+--------------------+  EIV                    Yes      Yes                  Patent by                                                                 color/doppler        +--------+---------------+---------+-----------+----------+--------------------+   +----+---------------+---------+-----------+----------+--------------+ LEFTCompressibilityPhasicitySpontaneityPropertiesThrombus Aging +----+---------------+---------+-----------+----------+--------------+ CFV Full           Yes      Yes                                 +----+---------------+---------+-----------+----------+--------------+     Summary: RIGHT: - Findings consistent with acute deep vein thrombosis involving the right common femoral vein, and SF junction.  - A cystic structure is found in the popliteal fossa (4.26 x 1.43 x 2.05 cm). Subcutaneous edema note throughout lower extremity.  LEFT: - No evidence of common femoral vein obstruction.   *See table(s) above for measurements and observations. Electronically signed by Debby Robertson on 09/27/2023 at 10:50:18 AM.    Final      Procedures   Medications Ordered in the ED  apixaban  (ELIQUIS ) tablet 10 mg (has no administration in time  range)    Followed by  apixaban  (ELIQUIS ) tablet 5 mg (has no administration in time range)  dexamethasone  (DECADRON ) injection 8 mg (has no administration in time range)                                    Medical Decision Making 58 year old female with past medical history of lung cancer on hospice presenting to the emergency department today with right upper and right lower extremity tremors as well as some numbness and tingling.  The patient's strength seems to be relatively well-preserved.  I will further evaluate her here with basic lab as well as an MRI to evaluate for intracranial hemorrhage or mass lesion given her malignancy.  She is denying any chest pain or shortness of breath currently.  Will hold off on giving her anticoagulation until her MRI is resulted to rule out any kind of hemorrhage at this hospital noted after she was started on anticoagulation.  I will reevaluate for ultimate disposition.  The patient's labs are reassuring.  MRI does show a new metastatic lesion is likely causing her symptoms.  The patient is given Decadron .  I did discuss her case with the family medicine team.  They will admit.  We also reached out to oncology for further recommendations.  Amount and/or Complexity of Data Reviewed Labs: ordered. Radiology: ordered.  Risk Prescription drug management. Decision regarding hospitalization.        Final diagnoses:  Metastasis to brain [facility])  Right sided weakness  Deep vein thrombosis (DVT) of proximal lower extremity, unspecified chronicity, unspecified laterality [facility])    ED Discharge Orders     None          Ula Prentice SAUNDERS, MD 09/28/23 [phone]

## 2023-09-28 NOTE — ED Notes (Addendum)
Pt went to MRI.

## 2023-09-28 NOTE — Assessment & Plan Note (Addendum)
 K of 3.3, likely in the setting of diet with chronic disease. - Repleting with K 40mEq once - Will continue to follow

## 2023-09-28 NOTE — Assessment & Plan Note (Addendum)
 Known malignant ascites.  Patient with new dark color to ascites, however.  No abdominal pain on exam and none reported at rest.  Will evaluate further with labs, do not feel repeat imaging is warranted at this time. - Continue home torsemide  40 mg daily - Consider spironolactone  pending CMP, appears patient reported she was not taking this to the pharmacy technician this admission - Labs: CMP, serum LDH, peritoneal LDH, peritoneal protein, body fluid culture, body fluid cell count given acute change to fluid

## 2023-09-28 NOTE — ED Notes (Signed)
 CCMD called to place the patient on cardiac monitoring services.

## 2023-09-28 NOTE — Plan of Care (Signed)
 FMTS Interim Progress Note  S: Patient seen at bedside along with Dr. Howell. Reports she is understandably feeling very overwhelmed with recent news of her progression to stage IV lung cancer. States she had gotten chemo/radiation previously (19 treatments) but stopped. She asks about how her neurological symptoms will progress given brain mets. Discussed with patient that we cannot fully predict this, but offered reassurance we will try our best to manage her symptoms.  O: BP (!) 152/90 (BP Location: Right Arm)   Pulse 80   Temp 98.2 F (36.8 C)   Resp 14   Ht 5' 7 (1.702 m)   Wt 72.6 kg   LMP 06/12/2016 (Approximate)   SpO2 100%   BMI 25.06 kg/m    General: sitting on edge of bed, NAD Cardio: normal rate and rhythm; S1/S2 Resp: normal respiratory effort Neuro: alert and oriented Psych: dysthymic mood  A/P:  Metastatic cancer Right sided weakness Ascites Right leg DVT Continue plan as noted in H&P per day team  Mannie Ashley SAILOR, MD 09/28/2023, 7:42 PM PGY-1, [facility] Service pager [phone]

## 2023-09-28 NOTE — ED Provider Triage Note (Signed)
 Emergency Medicine Provider Triage Evaluation Note  Cassidy [name] , a 58 y.o. female  was evaluated in triage.  Pt complains of tremors in her right arm and leg as well as difficulty ambulating with the right leg.  Patient was recently diagnosed with a DVT and started on Eliquis .  She denies any chest pain or shortness of breath currently.  She has been having increasing difficulty ambulating at home with this.  She is currently on home hospice.  Review of Systems  Positive: Right-sided weakness, numbness Negative: Chest pain  Physical Exam  BP 112/80   Pulse 79   Temp 98.2 F (36.8 C)   Resp 18   Ht 5' 7 (1.702 m)   Wt 72.6 kg   LMP 06/12/2016 (Approximate)   SpO2 100%   BMI 25.06 kg/m  Gen:   Awake, no distress   Resp:  Normal effort  MSK:   Moves extremities without difficulty with exception of RLE   Medical Decision Making  Medically screening exam initiated at 11:56 AM.  Appropriate orders placed.  GRACEANNA THEISSEN was informed that the remainder of the evaluation will be completed by another provider, this initial triage assessment does not replace that evaluation, and the importance of remaining in the ED until their evaluation is complete.  Symptoms may be secondary to known DVT.  Suspicion for pulmonary embolism is low with reassuring vital signs.  Will obtain MRI to evaluate for CVA given the patient's unilateral tremors to evaluate for CVA or metastatic disease.   Ula Prentice SAUNDERS, MD 09/28/23 1158

## 2023-09-28 NOTE — H&P (Addendum)
 Hospital Admission History and Physical Service Pager: [phone]  Patient name: Cassidy [name] Medical record number: [id number] Date of Birth: Oct 05, 1965 Age: 58 y.o. Gender: female  Primary Care Provider: Lorrane Pac, MD Consultants: Oncology, Palliative Code Status: DNR/DNI Preferred Emergency Contact:  Contact Information     Name Relation Home Work Mobile   Ransom,Celeste Sister [phone]  [phone]   Jamey, Demchak [phone]  [phone]      Other Contacts     Name Relation Home Work [place] Brother   [phone]   Rendall Bretta Lin   [phone]       Chief Complaint: Weakness in right leg, right hand tremor  Differential and Medical Decision Making:  [name] is a 58 y.o. 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  Differential for this patient's presentation of this includes: - Sequelae of cancer: Strong suspicion for mass effect from new finding of brain mass, likely metastasis from known lung cancer.  Investigating other potential causes as below. - Seizure: Concern for this given one-sided tremor in setting of new brain mass.  Will investigate further with EEG. - CVA: MRI brain with Cassidy findings consistent with CVA. - Vitamin B12 deficiency: Less likely given unilateral leg weakness, but will get a vitamin B12 level. - RPR/HIV: Less likely though investigating with labs for same. - Diabetic neuropathy: Hemoglobin A1c of 5.5 months ago, making this less likely.  Will reassess A1c this admission. Assessment & Plan Metastatic cancer [facility]) Right sided weakness Currently on hospice care.  Patient is thinking about pursuing further curative treatment for her condition.  Noticeable weakness on exam on the RLE. Will admit for further  discussion/workup of condition and palliation. - EEG pending - Pain regimen: Tylenol  500 mg every 6 hours - Continuing home pain regimen of morphine  concentrate 5 mg every 6 as needed for moderate pain, oxycodone  15 mg q4h as needed for severe pain, fentanyl  25 mcg patch every 72 hours, gabapentin  600 mg 3 times daily, duloxetine  30 mg daily, hyoscyamine  0.125 mg q6h as needed, Voltaren  4 times daily - Bowel regimen: MiraLAX  17 g daily, lactulose  10 g daily as needed, Colace 100 mg daily - Nausea: Zofran -ODT 8 mg q8h as needed, Reglan  10 mg q8h as needed - Ativan  0.5 q8h as needed anxiety or nausea - Folic acid  1 mg daily, ferrous sulfate  325 mg daily - PT/OT eval - Fall and delirium precautions - Oncology consulted, appreciate recommendations - Palliative consulted, appreciate recommendations - Labs: TSH, RPR, HIV, vitamin B12 - AM labs: CMP, CBC Ascites Known malignant ascites.  Patient with new dark color to ascites, however.  Cassidy abdominal pain on exam and none reported at rest.  Will evaluate further with labs, do not feel repeat imaging is warranted at this time. - Continue home torsemide  40 mg daily - Consider spironolactone  pending CMP, appears patient reported she was not taking this to the pharmacy technician this admission - Labs: CMP, serum LDH, peritoneal LDH, peritoneal protein, body fluid culture, body fluid cell count given acute change to fluid Right leg DVT (HCC) Diagnosed on 9/15 following US  done during ER visit. Prescribed Eliquis  10 mg BID for 7 days followed by 5 mg BID, which patient had not yet started. Of [name], upon review of prior imaging from 9/15, CT PE with questionable  very small pulmonary embolism versus artifact.  Already planning to treat with Eliquis , so additional imaging would not be beneficial at this time. - Starting Eliquis  10 mg twice daily for 7 days followed by 5 mg twice daily - Consider repeat CTA PE as clinically indicated T2DM (type 2 diabetes  mellitus) (HCC) Home Lantus  dosing of 24 units daily.  Repeating A1c this admission. - Start sliding scale insulin  for now - Add back home Lantus  as appropriate given different eating habits in hospital Hypokalemia K of 3.3, likely in the setting of diet with chronic disease. - Repleting with K 40mEq once - Will continue to follow Chronic health problem HTN: Continue home carvedilol  12.5 mg twice daily HLD: Continue home Lipitor 40 mg GERD: Continue home pantoprazole  40 mg twice daily COPD: Continue home Breo Ellipta  1 puff daily, ipratropium 0.5 mg every 4 hours as needed Depression/Anxiety: Continue home Buspar  7.5 3 times daily  FEN/GI: Regular diet VTE Prophylaxis: Eliquis   Disposition: Med-surg  History of Present Illness:  [name] is a 58 y.o. female presenting with weakness and dragging of the R leg/foot.  On Monday, she came in for foot dragging and right leg weakness. She would take a couple steps and end up dragging her foot. She came to the ED and was found to have a DVT, but she was not able to take her eliquis  as prescribed. Cassidy problems with the L side. She also has tremors which are new. The tremors are more easily controlled on the L side. She cannot hold anything in her R hand. She was being treated for her cancer, but she stopped after 19 treatments.  She has also had a new pineapple color coming out of her port-a-cath. Cassidy true fevers, hot flashes, or body aches. Cassidy urinary issues. Cassidy bowel movement in 3-4 days. She has some sacral pain and puts a pad on it, but there is Cassidy wound there.   In the ED, MRI was ordered which showed new metastatic lesion. Anticoagulation was held. Labs with stable Hgb, Cassidy elevated white count, low K (3.3), stable Cr. Urinalysis without evidence of UTI. She was given Decadron . Oncology was consulted.  Review Of Systems: Per HPI.  Pertinent Past Medical History: - Lung cancer stage IV with metastasis to bone, has malignant ascites  with pleurx catheter  - DVT - Anemia - CAD s/p stents - COPD - BPD/depression - T2DM - HTN - HLD - GERD Remainder reviewed in history tab.   Pertinent Past Surgical History: - Stents (05/2012)  - CABG  - S/p Pleurx catheter for malignant abdominal ascites Remainder reviewed in history tab.   Pertinent Social History: Tobacco use: Former (began ~44 years ago) Alcohol  use: None Other Substance use: None Lives by herself; has a cat  Pertinent Family History: N/A  Important Outpatient Medications: - Eliquis  10 mg BID for a week, then switch to 5 mg BID (prescribed on 9/15, had not started) - Oxycodone  15 mg 1-2 tablets q4h prn - Fentanyl  patch 25 mcg - Morphine  sulfat 0.25 mLs q6h prn - Gabapentin  600 mg TID - Duloxetine  30 mg daily - Torsemide  40 mg daily - Buspar  7.5 mg BID - Ativan  0.5 q8h prn - Lipitor 40 mg daily - Carvedilol  12.5 mg BID - Insulin  glargine 100u daily - Dulera  2 puffs BID - Protonix  40 mg BID - Colace 100 mg daily - Gavilax 17 g daily - Reglan  10 mg q8h prn - Lactulose  15 mLs daily prn - Voltaren   prn - Hyoscyamine  0.125 mg q6h prn - Ferrous sulfate  325 mg daily - Folic acid  1 tablet daily - Zofran  ODT 8 mg q8h prn - Nystatin  5 mLs QID  Objective: BP (!) 138/55   Pulse 72   Temp 98.2 F (36.8 C)   Resp 14   Ht 5' 7 (1.702 m)   Wt 72.6 kg   LMP 06/12/2016 (Approximate)   SpO2 100%   BMI 25.06 kg/m  Exam: General: Chronically ill-appearing, Cassidy acute distress Eyes: PERRL bilaterally, EOMI bilaterally.  Sclera nonicteric, conjunctiva noninjected. ENTM: MMM, oropharynx clear, equal elevation of palate bilaterally. Neck: Supple Cardiovascular: Regular rate and rhythm, Cassidy murmurs/rubs/gallops, 2+ bilateral dorsalis pedis pulses Respiratory: Normal work of breathing on room air. Clear to auscultation bilaterally; Cassidy wheezes, crackles. Gastrointestinal: Bowel sounds present and normoactive bilaterally. Soft, nontender, nondistended. 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  CN II-XII intact.  Equal sensation of bilateral upper and lower extremities.  Upper extremities strength 4 out of 5.  Left lower extremity strength 4 out of 5.  Right lower extremity strength 3 out of 5. Psych: Intermittently tearful affect when speaking if her condition, grossly oriented and recounting specific days over the course of her recent illness, answering all questions appropriately, good thought process, following commands, conversant  Labs:  CBC BMET  Recent Labs  Lab 09/28/23 1256  WBC 8.2  HGB 11.5*  HCT 35.1*  PLT 367   Recent Labs  Lab 09/28/23 1256  NA 138  K 3.3*  CL 103  CO2 22  BUN 10  CREATININE 1.01*  GLUCOSE 107*  CALCIUM  8.1*     Urinalysis    Component Value Date/Time   COLORURINE STRAW (A) 09/28/2023 1231   APPEARANCEUR CLEAR 09/28/2023 1231   LABSPEC 1.006 09/28/2023 1231   PHURINE 5.0 09/28/2023 1231   GLUCOSEU NEGATIVE 09/28/2023 1231   GLUCOSEU NEGATIVE 05/08/2015 1205   HGBUR NEGATIVE 09/28/2023 1231   HGBUR negative 11/24/2006 1453   BILIRUBINUR NEGATIVE 09/28/2023 1231   BILIRUBINUR negative 01/28/2023 1519   BILIRUBINUR NEGATIVE 01/18/2020 1036   KETONESUR NEGATIVE 09/28/2023 1231   PROTEINUR NEGATIVE 09/28/2023 1231   UROBILINOGEN 0.2 01/28/2023 1519   UROBILINOGEN 0.2 05/08/2015 1205   NITRITE NEGATIVE 09/28/2023 1231   LEUKOCYTESUR NEGATIVE 09/28/2023 1231   EKG: My own interpretation (not copied from electronic read): Normal sinus rhythm with unspecified conduction delay in II.   Imaging Studies Performed:  9/17 MRI Brain: New 5cm left parietal mass w prominent edema, most consistent with metastasis.  Larraine Palma, MD 09/28/2023, 5:57 PM PGY-1, [facility]  FPTS Intern pager: [phone], text pages  welcome Secure chat group [facility] Teaching Service   I agree with the assessment and plan as documented above.  Stuart Redo, MD PGY-3, [facility] Family Medicine

## 2023-09-28 NOTE — ED Notes (Signed)
 Refused to be stuck. She wants her port to be access for labs

## 2023-09-29 ENCOUNTER — Ambulatory Visit
Admit: 2023-09-29 | Discharge: 2023-09-29 | Disposition: A | Discharge status: Home / Self Care | Attending: Radiation Oncology | Admitting: Radiation Oncology

## 2023-09-29 ENCOUNTER — Inpatient Hospital Stay (HOSPITAL_COMMUNITY)

## 2023-09-29 ENCOUNTER — Other Ambulatory Visit (HOSPITAL_COMMUNITY): Payer: Self-pay

## 2023-09-29 ENCOUNTER — Telehealth (HOSPITAL_COMMUNITY): Payer: Self-pay | Admitting: Pharmacy Technician

## 2023-09-29 DIAGNOSIS — E1142 Type 2 diabetes mellitus with diabetic polyneuropathy: Secondary | ICD-10-CM | POA: Diagnosis present

## 2023-09-29 DIAGNOSIS — C799 Secondary malignant neoplasm of unspecified site: Secondary | ICD-10-CM | POA: Diagnosis present

## 2023-09-29 DIAGNOSIS — G936 Cerebral edema: Secondary | ICD-10-CM | POA: Diagnosis not present

## 2023-09-29 DIAGNOSIS — Z66 Do not resuscitate: Secondary | ICD-10-CM | POA: Diagnosis not present

## 2023-09-29 DIAGNOSIS — Z8249 Family history of ischemic heart disease and other diseases of the circulatory system: Secondary | ICD-10-CM | POA: Diagnosis not present

## 2023-09-29 DIAGNOSIS — E876 Hypokalemia: Secondary | ICD-10-CM | POA: Diagnosis present

## 2023-09-29 DIAGNOSIS — I5032 Chronic diastolic (congestive) heart failure: Secondary | ICD-10-CM | POA: Diagnosis present

## 2023-09-29 DIAGNOSIS — Z7189 Other specified counseling: Secondary | ICD-10-CM

## 2023-09-29 DIAGNOSIS — C7931 Secondary malignant neoplasm of brain: Secondary | ICD-10-CM | POA: Diagnosis not present

## 2023-09-29 DIAGNOSIS — Z9221 Personal history of antineoplastic chemotherapy: Secondary | ICD-10-CM | POA: Diagnosis not present

## 2023-09-29 DIAGNOSIS — I82421 Acute embolism and thrombosis of right iliac vein: Secondary | ICD-10-CM | POA: Diagnosis not present

## 2023-09-29 DIAGNOSIS — R220 Localized swelling, mass and lump, head: Secondary | ICD-10-CM | POA: Diagnosis not present

## 2023-09-29 DIAGNOSIS — R251 Tremor, unspecified: Secondary | ICD-10-CM | POA: Diagnosis present

## 2023-09-29 DIAGNOSIS — I2510 Atherosclerotic heart disease of native coronary artery without angina pectoris: Secondary | ICD-10-CM | POA: Diagnosis present

## 2023-09-29 DIAGNOSIS — C3490 Malignant neoplasm of unspecified part of unspecified bronchus or lung: Secondary | ICD-10-CM | POA: Diagnosis not present

## 2023-09-29 DIAGNOSIS — R531 Weakness: Secondary | ICD-10-CM | POA: Diagnosis not present

## 2023-09-29 DIAGNOSIS — C7951 Secondary malignant neoplasm of bone: Secondary | ICD-10-CM | POA: Diagnosis not present

## 2023-09-29 DIAGNOSIS — R180 Malignant ascites: Secondary | ICD-10-CM | POA: Diagnosis present

## 2023-09-29 DIAGNOSIS — G9389 Other specified disorders of brain: Secondary | ICD-10-CM | POA: Diagnosis not present

## 2023-09-29 DIAGNOSIS — R569 Unspecified convulsions: Secondary | ICD-10-CM

## 2023-09-29 DIAGNOSIS — I110 Hypertensive heart disease with heart failure: Secondary | ICD-10-CM | POA: Diagnosis present

## 2023-09-29 DIAGNOSIS — Z79899 Other long term (current) drug therapy: Secondary | ICD-10-CM | POA: Diagnosis not present

## 2023-09-29 DIAGNOSIS — Z7901 Long term (current) use of anticoagulants: Secondary | ICD-10-CM | POA: Diagnosis not present

## 2023-09-29 DIAGNOSIS — F32A Depression, unspecified: Secondary | ICD-10-CM | POA: Diagnosis present

## 2023-09-29 DIAGNOSIS — J4489 Other specified chronic obstructive pulmonary disease: Secondary | ICD-10-CM | POA: Diagnosis present

## 2023-09-29 DIAGNOSIS — G8193 Hemiplegia, unspecified affecting right nondominant side: Secondary | ICD-10-CM | POA: Diagnosis not present

## 2023-09-29 DIAGNOSIS — F17210 Nicotine dependence, cigarettes, uncomplicated: Secondary | ICD-10-CM | POA: Diagnosis present

## 2023-09-29 DIAGNOSIS — Z794 Long term (current) use of insulin: Secondary | ICD-10-CM | POA: Diagnosis not present

## 2023-09-29 DIAGNOSIS — Z515 Encounter for palliative care: Secondary | ICD-10-CM

## 2023-09-29 DIAGNOSIS — M797 Fibromyalgia: Secondary | ICD-10-CM | POA: Diagnosis present

## 2023-09-29 DIAGNOSIS — D573 Sickle-cell trait: Secondary | ICD-10-CM | POA: Diagnosis present

## 2023-09-29 DIAGNOSIS — Z7951 Long term (current) use of inhaled steroids: Secondary | ICD-10-CM | POA: Diagnosis not present

## 2023-09-29 DIAGNOSIS — R634 Abnormal weight loss: Secondary | ICD-10-CM | POA: Diagnosis present

## 2023-09-29 DIAGNOSIS — E785 Hyperlipidemia, unspecified: Secondary | ICD-10-CM | POA: Diagnosis present

## 2023-09-29 DIAGNOSIS — C3491 Malignant neoplasm of unspecified part of right bronchus or lung: Secondary | ICD-10-CM

## 2023-09-29 DIAGNOSIS — Z833 Family history of diabetes mellitus: Secondary | ICD-10-CM | POA: Diagnosis not present

## 2023-09-29 DIAGNOSIS — Z923 Personal history of irradiation: Secondary | ICD-10-CM | POA: Diagnosis not present

## 2023-09-29 DIAGNOSIS — K219 Gastro-esophageal reflux disease without esophagitis: Secondary | ICD-10-CM | POA: Diagnosis present

## 2023-09-29 LAB — HIV ANTIBODY (ROUTINE TESTING W REFLEX): HIV Screen 4th Generation wRfx: NONREACTIVE

## 2023-09-29 LAB — TSH: TSH: 0.88 u[IU]/mL (ref 0.350–4.500)

## 2023-09-29 LAB — COMPREHENSIVE METABOLIC PANEL WITH GFR
ALT: 9 U/L (ref 0–44)
ALT: 9 U/L (ref 0–44)
AST: 17 U/L (ref 15–41)
AST: 18 U/L (ref 15–41)
Albumin: 2.8 g/dL — ABNORMAL LOW (ref 3.5–5.0)
Albumin: 2.8 g/dL — ABNORMAL LOW (ref 3.5–5.0)
Alkaline Phosphatase: 65 U/L (ref 38–126)
Alkaline Phosphatase: 67 U/L (ref 38–126)
Anion gap: 13 (ref 5–15)
Anion gap: 16 — ABNORMAL HIGH (ref 5–15)
BUN: 16 mg/dL (ref 6–20)
BUN: 16 mg/dL (ref 6–20)
CO2: 21 mmol/L — ABNORMAL LOW (ref 22–32)
CO2: 24 mmol/L (ref 22–32)
Calcium: 8.2 mg/dL — ABNORMAL LOW (ref 8.9–10.3)
Calcium: 8.3 mg/dL — ABNORMAL LOW (ref 8.9–10.3)
Chloride: 97 mmol/L — ABNORMAL LOW (ref 98–111)
Chloride: 97 mmol/L — ABNORMAL LOW (ref 98–111)
Creatinine, Ser: 1.22 mg/dL — ABNORMAL HIGH (ref 0.44–1.00)
Creatinine, Ser: 1.23 mg/dL — ABNORMAL HIGH (ref 0.44–1.00)
GFR, Estimated: 51 mL/min — ABNORMAL LOW (ref >60)
GFR, Estimated: 51 mL/min — ABNORMAL LOW (ref >60)
Glucose, Bld: 157 mg/dL — ABNORMAL HIGH (ref 70–99)
Glucose, Bld: 160 mg/dL — ABNORMAL HIGH (ref 70–99)
Potassium: 4.3 mmol/L (ref 3.5–5.1)
Potassium: 4.3 mmol/L (ref 3.5–5.1)
Sodium: 134 mmol/L — ABNORMAL LOW (ref 135–145)
Sodium: 134 mmol/L — ABNORMAL LOW (ref 135–145)
Total Bilirubin: 0.6 mg/dL (ref 0.0–1.2)
Total Bilirubin: 0.7 mg/dL (ref 0.0–1.2)
Total Protein: 6.5 g/dL (ref 6.5–8.1)
Total Protein: 6.7 g/dL (ref 6.5–8.1)

## 2023-09-29 LAB — GLUCOSE, CAPILLARY
Glucose-Capillary: 178 mg/dL — ABNORMAL HIGH (ref 70–99)
Glucose-Capillary: 237 mg/dL — ABNORMAL HIGH (ref 70–99)
Glucose-Capillary: 197 mg/dL — ABNORMAL HIGH (ref 70–99)
Glucose-Capillary: 225 mg/dL — ABNORMAL HIGH (ref 70–99)

## 2023-09-29 LAB — CBC
HCT: 34.4 % — ABNORMAL LOW (ref 36.0–46.0)
Hemoglobin: 11.3 g/dL — ABNORMAL LOW (ref 12.0–15.0)
MCH: 30.1 pg (ref 26.0–34.0)
MCHC: 32.8 g/dL (ref 30.0–36.0)
MCV: 91.5 fL (ref 80.0–100.0)
Platelets: 340 K/uL (ref 150–400)
RBC: 3.76 MIL/uL — ABNORMAL LOW (ref 3.87–5.11)
RDW: 16 % — ABNORMAL HIGH (ref 11.5–15.5)
WBC: 5.8 K/uL (ref 4.0–10.5)
nRBC: 0 % (ref 0.0–0.2)

## 2023-09-29 LAB — VITAMIN B12: Vitamin B-12: 268 pg/mL (ref 180–914)

## 2023-09-29 LAB — LACTATE DEHYDROGENASE: LDH: 123 U/L (ref 98–192)

## 2023-09-29 LAB — HEMOGLOBIN A1C
Hgb A1c MFr Bld: 5 % (ref 4.8–5.6)
Mean Plasma Glucose: 96.8 mg/dL

## 2023-09-29 LAB — RPR: RPR Ser Ql: NONREACTIVE

## 2023-09-29 MED ORDER — CHLORHEXIDINE GLUCONATE CLOTH 2 % EX PADS
6.0000 | MEDICATED_PAD | Freq: Every day | CUTANEOUS | Status: DC
  Administered 2023-09-29 – 2023-09-30 (×2): 6 via TOPICAL

## 2023-09-29 MED ORDER — FENTANYL 25 MCG/HR TD PT72: 1.0000 | MEDICATED_PATCH | TRANSDERMAL | Status: DC

## 2023-09-29 MED ORDER — SODIUM CHLORIDE 0.9% FLUSH: 10.0000 mL | INTRAVENOUS | Status: DC | PRN

## 2023-09-29 MED ORDER — GADOBUTROL 1 MMOL/ML IV SOLN
7.0000 mL | Freq: Once | INTRAVENOUS | Status: AC | PRN
Start: 2023-09-29 — End: 2023-09-29
  Administered 2023-09-29: 7 mL via INTRAVENOUS

## 2023-09-29 MED ORDER — FENTANYL 50 MCG/HR TD PT72
1.0000 | MEDICATED_PATCH | TRANSDERMAL | Status: DC
  Administered 2023-09-29: 1 via TRANSDERMAL
  Filled 2023-09-29: qty 1

## 2023-09-29 MED ORDER — DEXAMETHASONE 4 MG PO TABS
8.0000 mg | ORAL_TABLET | Freq: Three times a day (TID) | ORAL | Status: DC
  Administered 2023-09-29 – 2023-09-30 (×4): 8 mg via ORAL
  Filled 2023-09-29 (×7): qty 2

## 2023-09-29 MED ORDER — INSULIN GLARGINE 100 UNIT/ML ~~LOC~~ SOLN
5.0000 [IU] | Freq: Every day | SUBCUTANEOUS | Status: DC
  Administered 2023-09-29 – 2023-09-30 (×2): 5 [IU] via SUBCUTANEOUS
  Filled 2023-09-29 (×2): qty 0.05

## 2023-09-29 NOTE — TOC CM/SW Note (Addendum)
 Transition of Care [facility]) - Inpatient Brief Assessment   Patient Details  Name: CHARLISHA MARKET MRN: [id number] Date of Birth: [birth year]  Transition of Care [facility]) CM/SW Contact:    Lauraine FORBES Saa, LCSWA Phone Number: 09/29/2023, 3:50 PM   Clinical Narrative:  3:50 PM Per chart review, patient resides at home alone. Patient has a PCP and insurance. Patient does not have SNF history. Patient has HH history with [facility], [facility], and [facility]. Patient is currently receiving [facility] with [facility]. Patient has DME (BSC, walker, nebulizer machine) history. Patient's preferred pharmacy's are [facility] [address] [facility] [address] [place], [facility] and [facility]. TOC consult was placed for ALF placement. TOC informed medical team that TOC does not assist with ALF placement unless there are no other support options. Physical therapy recommended patient discharge with HHPT. CSW attempted to speak with patient at bedside. Patient requested CSW to return at a later time. TOC will continue to follow and be available to assist.  Transition of Care Asessment: Insurance and Status: Insurance coverage has been reviewed Patient has primary care physician: Yes Home environment has been reviewed: Private Residence Prior level of function:: Independent/Modified Audiological scientist Home Services: Current home services [facility]) Social Drivers of Health Review: SDOH reviewed no interventions necessary Readmission risk has been reviewed: Yes (Currently Red 41%) Transition of care needs: transition of care needs identified, TOC will continue to follow

## 2023-09-29 NOTE — Plan of Care (Signed)

## 2023-09-29 NOTE — Telephone Encounter (Signed)
 Pharmacy Patient Advocate Encounter  Insurance verification completed.    The patient is insured through [facility] MEDICAID.     Ran test claim for Eliquis  5mg  tablets and the current 30 day co-pay is $4.00.   This test claim was processed through [facility]- copay amounts may vary at other pharmacies due to pharmacy/plan contracts, or as the patient moves through the different stages of their insurance plan.

## 2023-09-29 NOTE — Assessment & Plan Note (Addendum)
 Vital signs stable overnight, currently on hospice care. Strength symmetric bilaterally today. CBC stable, BMP creatinine up slightly 1.01 > 1.21 but not criteria for AKI. MRI brain without contrast with new 5 cm left parietal mass with prominent edema consistent with metastasis. Has lost 60 lbs in last year or so. - EEG pending - Continue 8 mg Dexamethasone  TID, Received 8 mg dexamethasone  at 1638 and repeat at 5 AM, pending oncology recs for further treatment management - Pain regimen: Tylenol  500 mg every 6 hours - Continuing home pain regimen of morphine , oxycodone , fentanyl  patch increased to 50 mcg, gabapentin , duloxetine , hyoscyamine , and Voltaren  - Oncology consulted, appreciate recommendations - Radiation oncology to see her today to evaluate for possible palliative radiation therapy - Palliative consulted, appreciate recommendations - AM labs: CMP, CBC

## 2023-09-29 NOTE — Consult Note (Signed)
 Radiation Oncology         [phone] ________________________________  Name: Cassidy [name]        MRN: [id number]  Date of Service: 09/29/23 DOB: Mar 16, 1965  RR:Wbhjjmi, Fairy, MD    REFERRING PHYSICIAN: Dr. Tharon     DIAGNOSIS: The primary encounter diagnosis was Metastasis to brain [facility]). Diagnoses of Right sided weakness and Deep vein thrombosis (DVT) of proximal lower extremity, unspecified chronicity, unspecified laterality (HCC) were also pertinent to this visit.   HISTORY OF PRESENT ILLNESS: Cassidy [name] is a 58 y.o. female seen at the request of Dr. Tharon with the hospitalist service for a history of stage IV lung cancer.  Cassidy [name] is known to our clinic as she was previously treated with palliative radiation to her lumbar spine in 2023.  Her tumor was originally diagnosed as Stage IV, NSCLC, adenocarcinoma with K-ras mutation of the RUL involving the lungs, nodes of the chest, liver and bones and she was treated with palliative chemoimmunotherapy beginning in August 2023.  She had some changes to her regimen including dose reductions and ultimately discontinued chemotherapy due to patient request.  Her last dose of chemoimmunotherapy was on 09/15/2022.  She ultimately decided in April 2025 follow with palliative medicine more exclusively and in June 2025 decided to enroll in hospice care.  She has been under this care with [facility] home health agency and has been quite pleased with their services.  She came to the emergency room however due to chest pain and weakness on 09/26/2023, she was found to have a DVT in her right lower extremity and started on anticoagulation and left AMA.  She developed  new onset of tremors and right lower extremity weakness.  Which prompted return to the emergency room and an MRI brain without contrast showed a new 5 cm left parietal mass with prominent edema consistent with metastatic disease.  Given these findings she was admitted and started on  dexamethasone  at 8 mg 3 times daily.  We have been asked to consider palliative options of radiation.  The patient is contacted by phone to discuss these options.   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  s/p PCTA to dRCA and ostial RPAV-PLA vessel + PDA branch (05/2012)  b. [place]  s/p DES to mLAD - Resolute DES 3.0 x 22 (3.15mm -->3.3 mm)   Bipolar depression (HCC)    Breast abscess    a. right side.    COPD (chronic obstructive pulmonary disease) (HCC)    Depression    Diabetic peripheral neuropathy (HCC)    Fibromyalgia    Genital warts    GERD (gastroesophageal reflux disease)    History of hiatal hernia    HLD (hyperlipidemia)    Hypertension    Lung cancer (HCC)    Pain in limb    a. LE VENOUS DUPLEX, 02/05/2009 - no evidence of deep vein thrombosis, Baker's cyst   Pneumonia    PTSD (post-traumatic stress disorder)    Sickle cell trait (HCC)    Tobacco abuse  Tooth caries    Type II diabetes mellitus (HCC)        PAST SURGICAL HISTORY: Past Surgical History:  Procedure Laterality Date   BLADDER SURGERY  1980   TVT   BLADDER SURGERY  2003   TVT   BREAST EXCISIONAL BIOPSY     BREAST SURGERY Right    I&D for multiple abscesses   CARDIAC CATHETERIZATION     CORONARY ARTERY BYPASS GRAFT N/A 06/30/2021   Procedure: CORONARY ARTERY BYPASS GRAFTING (CABG) x3 USING LEFT INTERNAL MAMMARY ARTERY,  RIGHT RADIAL ARTERY AND LEFT GREATER SAPHENOUS VEIN;  Surgeon: Shyrl Linnie KIDD, MD;  Location: [facility] OR;  Service: Open Heart Surgery;  Laterality: N/A;   cutting  balloon     ENDOVEIN HARVEST OF GREATER SAPHENOUS VEIN Left 06/30/2021   Procedure: ENDOVEIN HARVEST OF GREATER SAPHENOUS VEIN;  Surgeon: Shyrl Linnie KIDD, MD;  Location: [facility] OR;  Service: Open Heart Surgery;  Laterality: Left;   EYE SURGERY Left    laser surgery   FINE NEEDLE ASPIRATION  08/21/2021   Procedure: FINE NEEDLE ASPIRATION (FNA) LINEAR;  Surgeon: Brenna Adine CROME, DO;  Location: [facility] ENDOS[facility];  Service: Pulmonary;;   INCISE AND DRAIN ABCESS  ; 04/26/11; 08/13/11   right breast   INCISION AND DRAINAGE ABSCESS Right 05/09/2012   Procedure: INCISION AND DRAINAGE RIGHT BREAST ABSCESS;  Surgeon: Donnice POUR. Belinda, MD;  Location: [facility] OR;  Service: General;  Laterality: Right;   INCISION AND DRAINAGE ABSCESS Right 02/08/2014   Procedure: INCISION AND DRAINAGE RIGHT BREAST ABSCESS;  Surgeon: Donnice Belinda, MD;  Location: [facility];  Service: General;  Laterality: Right;   INCISION AND DRAINAGE ABSCESS Right 06/14/2014   Procedure: INCISION AND DRAINAGE RIGHT BREAST ABSCESS;  Surgeon: Donnice Belinda, MD;  [facility];  Service: General;  Laterality: Right;   IR IMAGING GUIDED PORT INSERTION  12/08/2021   IR PARACENTESIS  02/02/2023   IR PARACENTESIS  02/23/2023   IR PERC TUN PERIT CATH WO PORT S&I /IMAG  02/16/2023   IRRIGATION AND DEBRIDEMENT ABSCESS  12/19/2010   Procedure: IRRIGATION AND DEBRIDEMENT ABSCESS;  Surgeon: Donnice Bury, MD;  [facility]: [facility];  Service: General;  Laterality: Right;   IRRIGATION AND DEBRIDEMENT ABSCESS  08/13/2011   Procedure: MINOR INCISION AND DRAINAGE OF ABSCESS;  Surgeon: Lynwood KIDD Pina, MD;  Location: [facility];  Service: General;  Laterality: Right;  Right Breast    IRRIGATION AND DEBRIDEMENT ABSCESS  11/17/2011   Procedure: IRRIGATION AND DEBRIDEMENT ABSCESS;  Surgeon: Donnice POUR. Belinda, MD;  [facility];  Service: General;  Laterality: Right;  irrigation and debridement right recurrent breast abscess   LARYNX SURGERY     LEFT HEART CATH AND CORONARY  ANGIOGRAPHY N/A 04/09/2021   Procedure: LEFT HEART CATH AND CORONARY ANGIOGRAPHY;  Surgeon: Dann Candyce RAMAN, MD;  Location: [facility] INVASIVE CV LAB;  Service: Cardiovascular;  Laterality: N/A;   LEFT HEART CATHETERIZATION WITH CORONARY ANGIOGRAM N/A 05/29/2012   Procedure: LEFT HEART CATHETERIZATION WITH CORONARY ANGIOGRAM & PTCA;  Surgeon: Debby DELENA Sor, MD;  Location: [facility] CATH LAB;  Service: Cardiovascular; Bifurcation dRCA-RPAD/PLA & rPDA  PTCA.   LEFT HEART CATHETERIZATION WITH CORONARY ANGIOGRAM N/A 11/08/2013   Procedure: LEFT HEART CATHETERIZATION WITH CORONARY ANGIOGRAM and Coronary Stent Intervention;  Surgeon: Alm LELON Clay, MD;  Location: [facility] CATH LAB;  Service: Cardiovascular; mLAD 80% (FFR 0.73) - resolute DES 3.0 x 22 mm (postdilated to 3.3 mm->3.5 mm)   NM MYOVIEW  LTD  01/26/2009   Normal  study, no evidence of ischemia, EF 67%   ployp removed from voice box 03/30/12     RADIAL ARTERY HARVEST Right 06/30/2021   Procedure: RADIAL ARTERY HARVEST;  Surgeon: Shyrl Linnie KIDD, MD;  [facility];  Service: Open Heart Surgery;  Laterality: Right;   REFRACTIVE SURGERY  ~ 2010   right   TEE WITHOUT CARDIOVERSION N/A 06/30/2021   Procedure: TRANSESOPHAGEAL ECHOCARDIOGRAM (TEE);  Surgeon: Shyrl Linnie KIDD, MD;  Location: [facility] OR;  Service: Open Heart Surgery;  Laterality: N/A;   TONSILLECTOMY  1988   VIDEO BRONCHOSCOPY WITH ENDOBRONCHIAL ULTRASOUND Bilateral 08/21/2021   Procedure: VIDEO BRONCHOSCOPY WITH ENDOBRONCHIAL ULTRASOUND;  Surgeon: Brenna Adine CROME, DO;  Location: [facility] END[facility];  Service: Pulmonary;  Laterality: Bilateral;     FAMILY HISTORY:  Family History  Problem Relation Age of Onset   Esophageal cancer Mother    COPD Mother    Cancer Mother        lymphoma   Heart disease Mother    Stomach cancer Mother    Hyperlipidemia Father    Heart disease Father    Cancer Maternal Aunt        breast, colon   Crohn's disease Maternal Aunt    Hypertension Maternal  Grandmother    Diabetes Maternal Grandmother    Hypertension Brother    Rectal cancer Neg Hx    Liver cancer Neg Hx    Colon cancer Neg Hx    Breast cancer Neg Hx      SOCIAL HISTORY:  reports that she has quit smoking. Her smoking use included cigarettes. She started smoking about 44 years ago. She has a 22.4 pack-year smoking history. She has never used smokeless tobacco. She reports that she does not currently use drugs. She reports that she does not drink alcohol .  The patient is single and lives in [place].  She has family including her sister and brother and a close friend who help with her needs at home.   ALLERGIES: Amoxil  [amoxicillin ], Chantix [varenicline], Suboxone [buprenorphine hcl-naloxone hcl], and Emend [fosaprepitant  dimeglumine]   MEDICATIONS:  Current Facility-Administered Medications  Medication Dose Route Frequency Provider Last Rate Last Admin   acetaminophen  (TYLENOL ) tablet 500 mg  500 mg Oral Q6H Nemecek, Amanda, MD   500 mg at 09/29/23 1840   apixaban  (ELIQUIS ) tablet 10 mg  10 mg Oral BID Tharon Lung, MD   10 mg at 09/29/23 9081   Followed by   NOREEN ON 10/05/2023] apixaban  (ELIQUIS ) tablet 5 mg  5 mg Oral BID Tharon Lung, MD       artificial tears ophthalmic solution 1 drop  1 drop Both Eyes PRN Uhlorn, Garett M, RPH       atorvastatin  (LIPITOR) tablet 40 mg  40 mg Oral Daily Tharon Lung, MD   40 mg at 09/29/23 0919   busPIRone  (BUSPAR ) tablet 7.5 mg  7.5 mg Oral BID Tharon Lung, MD   7.5 mg at 09/29/23 0920   carvedilol  (COREG ) tablet 12.5 mg  12.5 mg Oral BID WC Tharon Lung, MD   12.5 mg at 09/29/23 0920   Chlorhexidine  Gluconate Cloth 2 % PADS 6 each  6 each Topical Daily McDiarmid, Krystal BIRCH, MD   6 each at 09/29/23 9078   dexamethasone  (DECADRON ) tablet 8 mg  8 mg Oral Q8H Tharon Lung, MD   8 mg at 09/29/23 1316   diclofenac  Sodium (VOLTAREN ) 1 % topical gel 4 g  4 g Topical QID Tharon Lung, MD   4  g at 09/29/23 9077   docusate sodium  (COLACE)  capsule 100 mg  100 mg Oral Daily Tharon Lung, MD   100 mg at 09/29/23 9081   DULoxetine  (CYMBALTA ) DR capsule 30 mg  30 mg Oral Daily Tharon Lung, MD   30 mg at 09/29/23 0920   feeding supplement (ENSURE PLUS HIGH PROTEIN) liquid 237 mL  237 mL Oral BID BM McDiarmid, Krystal BIRCH, MD   237 mL at 09/29/23 1318   [START ON 09/30/2023] fentaNYL  (DURAGESIC ) 25 MCG/HR 1 patch  1 patch Transdermal Q72H Tharon Lung, MD       ferrous sulfate  tablet 325 mg  325 mg Oral Q breakfast Tharon Lung, MD   325 mg at 09/29/23 9081   fluticasone  (FLONASE ) 50 MCG/ACT nasal spray 1 spray  1 spray Each Nare Daily PRN Tharon Lung, MD       fluticasone  furoate-vilanterol (BREO ELLIPTA ) 100-25 MCG/ACT 1 puff  1 puff Inhalation Daily Tharon Lung, MD   1 puff at 09/28/23 2132   folic acid  (FOLVITE ) tablet 1 mg  1 mg Oral Daily Tharon Lung, MD   1 mg at 09/29/23 0920   gabapentin  (NEURONTIN ) capsule 600 mg  600 mg Oral TID Tharon Lung, MD   600 mg at 09/29/23 1625   hyoscyamine  (LEVSIN  SL) SL tablet 0.125 mg  0.125 mg Sublingual Q6H PRN Tharon Lung, MD       insulin  aspart (novoLOG ) injection 0-15 Units  0-15 Units Subcutaneous TID WC Tharon Lung, MD   3 Units at 09/29/23 1841   insulin  glargine (LANTUS ) injection 5 Units  5 Units Subcutaneous Daily Tharon Lung, MD   5 Units at 09/29/23 1400   ipratropium (ATROVENT ) nebulizer solution 0.5 mg  0.5 mg Inhalation Q4H PRN Tharon Lung, MD       lactulose  (CHRONULAC ) 10 GM/15ML solution 10 g  10 g Oral Daily PRN Tharon Lung, MD       lidocaine -prilocaine  (EMLA ) cream 1 Application  1 Application Topical PRN Tharon Lung, MD       LORazepam  (ATIVAN ) tablet 0.5 mg  0.5 mg Oral Q8H PRN Tharon Lung, MD   0.5 mg at 09/29/23 1625   metoCLOPramide  (REGLAN ) tablet 10 mg  10 mg Oral Q8H PRN Tharon Lung, MD       morphine  CONCENTRATE 10 mg / 0.5 ml oral solution 5 mg  5 mg Oral Q6H PRN Tharon Lung, MD   5 mg at 09/29/23 1358   nystatin  (MYCOSTATIN ) 100000 UNIT/ML suspension 500,000  Units  5 mL Oral QID Tharon Lung, MD   500,000 Units at 09/29/23 1320   ondansetron  (ZOFRAN -ODT) disintegrating tablet 8 mg  8 mg Oral Q8H PRN Tharon Lung, MD   8 mg at 09/29/23 1317   oxyCODONE  (Oxy IR/ROXICODONE ) immediate release tablet 15 mg  15 mg Oral Q4H PRN Tharon Lung, MD   15 mg at 09/29/23 1630   pantoprazole  (PROTONIX ) EC tablet 40 mg  40 mg Oral BID Tharon Lung, MD   40 mg at 09/29/23 9081   polyethylene glycol (MIRALAX  / GLYCOLAX ) packet 17 g  17 g Oral Daily Tharon Lung, MD   17 g at 09/29/23 9078   sodium chloride  flush (NS) 0.9 % injection 10-40 mL  10-40 mL Intracatheter PRN McDiarmid, Krystal BIRCH, MD       torsemide  (DEMADEX ) tablet 40 mg  40 mg Oral Daily Tharon Lung, MD   40 mg at 09/29/23 9081     REVIEW OF SYSTEMS: On review  of systems, the patient reports that she is feeling a little bit better since coming into the hospital.  She states that her weakness is slowly improving.  She would like to get back to home with hospice agency benefits and reports that she feels supported with her care at home by her family and friends.  She is motivated to consider treatments if they will help her live with less symptoms.  No other complaints are verbalized    PHYSICAL EXAM:  Wt Readings from Last 3 Encounters:  09/28/23 160 lb (72.6 kg)  08/10/23 152 lb (68.9 kg)  05/04/23 192 lb 4.8 oz (87.2 kg)   Temp Readings from Last 3 Encounters:  09/29/23 98 F (36.7 C) (Oral)  09/26/23 98.2 F (36.8 C) (Oral)  05/04/23 98.1 F (36.7 C)   BP Readings from Last 3 Encounters:  09/29/23 103/70  09/26/23 128/86  08/10/23 (!) 89/61   Pulse Readings from Last 3 Encounters:  09/29/23 83  09/26/23 72  08/10/23 80   Pain Assessment Pain Score: 7 /10  On 8 to assess given encounter type.  ECOG = 2  0 - Asymptomatic (Fully active, able to carry on all predisease activities without restriction)  1 - Symptomatic but completely ambulatory (Restricted in physically strenuous  activity but ambulatory and able to carry out work of a light or sedentary nature. For example, light housework, office work)  2 - Symptomatic, <50% in bed during the day (Ambulatory and capable of all self care but unable to carry out any work activities. Up and about more than 50% of waking hours)  3 - Symptomatic, >50% in bed, but not bedbound (Capable of only limited self-care, confined to bed or chair 50% or more of waking hours)  4 - Bedbound (Completely disabled. Cannot carry on any self-care. Totally confined to bed or chair)  5 - Death   Raylene MM, Creech RH, Tormey DC, et al. [phone]). Toxicity and response criteria of the [facility]. Am. DOROTHA Bridges. Oncol. 5 (6): 649-55    LABORATORY DATA:  Lab Results  Component Value Date   WBC 5.8 09/29/2023   HGB 11.3 (L) 09/29/2023   HCT 34.4 (L) 09/29/2023   MCV 91.5 09/29/2023   PLT 340 09/29/2023   Lab Results  Component Value Date   NA 134 (L) 09/29/2023   NA 134 (L) 09/29/2023   K 4.3 09/29/2023   K 4.3 09/29/2023   CL 97 (L) 09/29/2023   CL 97 (L) 09/29/2023   CO2 24 09/29/2023   CO2 21 (L) 09/29/2023   Lab Results  Component Value Date   ALT 9 09/29/2023   ALT 9 09/29/2023   AST 17 09/29/2023   AST 18 09/29/2023   ALKPHOS 67 09/29/2023   ALKPHOS 65 09/29/2023   BILITOT 0.7 09/29/2023   BILITOT 0.6 09/29/2023      RADIOGRAPHY: MR BRAIN W WO CONTRAST Result Date: 09/29/2023 EXAM: MRI BRAIN WITH AND WITHOUT CONTRAST 09/29/2023 05:56:38 PM TECHNIQUE: Multiplanar multisequence MRI of the head/brain was performed with and without the administration of intravenous contrast. COMPARISON: MRI head 09/28/2023 CLINICAL HISTORY: Brain/CNS neoplasm, staging. FINDINGS: BRAIN AND VENTRICLES: There is a 4.2 x 3.2 x 5.0 cm heterogeneously enhancing mass centered in the left parietal lobe with surrounding vasogenic edema extending into the posteroaspect of the left frontal lobe, left periventricular white matter,  and left occipital lobe. There is a small focus of extension into the left splenium of the corpus callosum. The  mass demonstrates restricted diffusion. A few areas of susceptibility are noted within the mass along with intrinsic T1 hyperintensity. There is associated local mass effect and sulcal effacement within this region without significant midline shift. There is mass effect on the posterior body and atrium of the left lateral ventricle and the left occipital horn. Mild dural enhancement is noted over the left parietal lobe in the region of the mass. No additional areas of diffusion signal abnormality or evidence of acute infarct. ORBITS: No acute abnormality. SINUSES: No acute abnormality. BONES AND SOFT TISSUES: Normal bone marrow signal and enhancement. No acute soft tissue abnormality. IMPRESSION: 1. 5.0 cm enhancing mass in the left parietal lobe with surrounding vasogenic edema, most concerning for metastatic disease. 2. Local mass effect and partial effacement of the left lateral ventricle. No midline shift. 3. Mild dural enhancement overlying the mass. 4. Mass abuts the superior sagittal sinus with evidence of invasion. 5. No additional intracranial lesions noted. Electronically signed by: Donnice Mania MD 09/29/2023 06:42 PM EDT RP Workstation: [id number]   EEG adult Result Date: 09/29/2023 Shelton Arlin KIDD, MD     09/29/2023  2:47 PM Patient Name: Cassidy [name] MRN: [id number] Epilepsy Attending: Arlin KIDD Shelton Referring Physician/Provider: Tharon Lung, MD Date: 09/29/2023 Duration: 27.52 mins Patient history: 58yo female with tremors in setting of new brain metastasis. EEG to evaluate for seizure Level of alertness: Awake, asleep AEDs during EEG study: GBP Technical aspects: This EEG study was done with scalp electrodes positioned according to the 10-20 International system of electrode placement. Electrical activity was reviewed with band pass filter of 1-70Hz , sensitivity of 7 uV/mm, display  speed of 66mm/sec with a 60Hz  notched filter applied as appropriate. EEG data were recorded continuously and digitally stored.  Video monitoring was available and reviewed as appropriate. Description: The posterior dominant rhythm consists of 9 Hz activity of moderate voltage (25-35 uV) seen predominantly in posterior head regions, symmetric and reactive to eye opening and eye closing. Sleep was characterized by vertex waves, sleep spindles (12 to 14 Hz), maximal frontocentral region. EEG showed continuous 3 to 6 Hz theta-delta slowing in left temporo-parietal region. Hyperventilation and photic stimulation were not performed.   ABNORMALITY - Continuous slow, left temporo-parietal region IMPRESSION: This study is suggestive of cortical dysfunction arising from left temporo-parietal region likely secondary to underlying structural abnormality. No seizures or definite epileptiform discharges were seen throughout the recording. Please note lack of epileptiform activity during interictal EEG does not exclude the diagnosis of epilepsy. Arlin KIDD Shelton   MR BRAIN WO CONTRAST Result Date: 09/28/2023 CLINICAL DATA:  Neuro deficit, acute, stroke suspected. Right arm and leg tremors with difficulty walking. History of lung cancer. EXAM: MRI HEAD WITHOUT CONTRAST TECHNIQUE: Multiplanar, multiecho pulse sequences of the brain and surrounding structures were obtained without intravenous contrast. COMPARISON:  Head MRI 01/26/2022 FINDINGS: Brain: There is a new, approximately 5 cm mass in the left parietal lobe with prominent surrounding vasogenic edema resulting in regional mass effect including effacement of the posterior left lateral ventricle and trace rightward midline shift. A small amount of associated susceptibility and intrinsic T1 hyperintensity may reflect hemorrhage and/or mineralization. No mass or edema is identified elsewhere on this unenhanced study. No acute infarct, hydrocephalus, or extra-axial fluid  collection is evident. Cerebral volume is normal for age. No significant chronic white matter disease is evident. Vascular: Major intracranial vascular flow voids are preserved. Skull and upper cervical spine: Unchanged 1.5 cm focus of FLAIR hyperintensity in the superior  midline occipital bone at the site of a previously shown metastasis. Sinuses/Orbits: Unremarkable orbits. Mild mucosal thickening in the paranasal sinuses. Clear mastoid air cells. Other: None. IMPRESSION: New 5 cm left parietal mass with prominent edema most consistent with a metastasis. Postcontrast imaging is recommended for complete staging. Electronically Signed   By: Dasie Hamburg M.D.   On: 09/28/2023 15:37   VAS US  LOWER EXTREMITY VENOUS (DVT) (ONLY [facility]) Result Date: 09/27/2023  Lower Venous DVT Study Patient Name:  Cassidy [name]  Date of Exam:   09/26/2023 Medical Rec #: [id number]        Accession #:    [id number] Date of Birth: April 28, 1965       Patient Gender: F Patient Age:   37 years Exam Location:  [facility] Procedure:      VAS US  LOWER EXTREMITY VENOUS (DVT) Referring Phys: ABIGAIL HARRIS --------------------------------------------------------------------------------  Indications: Swelling.  Risk Factors: Cancer - stage 4 metatstatic lung cancer. Limitations: Poor ultrasound/tissue interface. Comparison Study: Previous exam on 10/29/2022 was negative for DVT Performing Technologist: Ezzie Potters RVT, RDMS  Examination Guidelines: A complete evaluation includes B-mode imaging, spectral Doppler, color Doppler, and power Doppler as needed of all accessible portions of each vessel. Bilateral testing is considered an integral part of a complete examination. Limited examinations for reoccurring indications may be performed as noted. The reflux portion of the exam is performed with the patient in reverse Trendelenburg.  +--------+---------------+---------+-----------+----------+--------------------+ RIGHT    CompressibilityPhasicitySpontaneityPropertiesThrombus Aging       +--------+---------------+---------+-----------+----------+--------------------+ CFV     Partial        Yes      Yes                  Acute                +--------+---------------+---------+-----------+----------+--------------------+ SFJ     Partial                                      Acute                +--------+---------------+---------+-----------+----------+--------------------+ FV Prox Full           Yes      Yes                                       +--------+---------------+---------+-----------+----------+--------------------+ FV Mid  Full           Yes      Yes                                       +--------+---------------+---------+-----------+----------+--------------------+ FV      Full           Yes      Yes                                       Distal                                                                    +--------+---------------+---------+-----------+----------+--------------------+  PFV     Full                                                              +--------+---------------+---------+-----------+----------+--------------------+ POP     Full           Yes      Yes                                       +--------+---------------+---------+-----------+----------+--------------------+ PTV                                                  Not well visualized  +--------+---------------+---------+-----------+----------+--------------------+ PERO    Full                                                              +--------+---------------+---------+-----------+----------+--------------------+ EIV                    Yes      Yes                  Patent by                                                                 color/doppler        +--------+---------------+---------+-----------+----------+--------------------+    +----+---------------+---------+-----------+----------+--------------+ LEFTCompressibilityPhasicitySpontaneityPropertiesThrombus Aging +----+---------------+---------+-----------+----------+--------------+ CFV Full           Yes      Yes                                 +----+---------------+---------+-----------+----------+--------------+     Summary: RIGHT: - Findings consistent with acute deep vein thrombosis involving the right common femoral vein, and SF junction.  - A cystic structure is found in the popliteal fossa (4.26 x 1.43 x 2.05 cm). Subcutaneous edema note throughout lower extremity.  LEFT: - No evidence of common femoral vein obstruction.   *See table(s) above for measurements and observations. Electronically signed by Debby Robertson on 09/27/2023 at 10:50:18 AM.    Final    CT ANGIO CHEST PE W OR WO CONTRAST Result Date: 09/26/2023 CLINICAL DATA:  Pulmonary embolism suspected, high probability. Chest pain and weakness. Lung cancer. EXAM: CT ANGIOGRAPHY CHEST WITH CONTRAST TECHNIQUE: Multidetector CT imaging of the chest was performed using the standard protocol during bolus administration of intravenous contrast. Multiplanar CT image reconstructions and MIPs were obtained to evaluate the vascular anatomy. RADIATION DOSE REDUCTION: This exam was performed according to the departmental dose-optimization program which includes automated exposure control, adjustment of the mA and/or kV according  to patient size and/or use of iterative reconstruction technique. CONTRAST:  75mL OMNIPAQUE  IOHEXOL  350 MG/ML SOLN COMPARISON:  07/25/2023 FINDINGS: Cardiovascular: Single small linear defect involving a right upper lobe pulmonary artery on image 140/7. No other areas are concerning for pulmonary embolism. Heart size is normal. Post CABG changes. Native coronary arteries are calcified. Normal caliber of the thoracic aorta. Atherosclerotic disease in the descending thoracic aorta. Mediastinum/Nodes:  No significant mediastinal or hilar lymph node enlargement. No significant axillary lymph node enlargement. No gross abnormality to the esophagus. Lungs/Pleura: Trachea and mainstem bronchi are patent. Volume loss and atelectasis in the right lower lobe. Poorly defined spiculated density in the right upper lobe on image 33/6 is minimally changed measuring up to 1.4 cm. Peripheral opacities in lateral aspect of the right lung are stable. Trace right pleural fluid along the fissures. Upper Abdomen: Again noted is upper abdominal ascites. Elevation of the right hemidiaphragm. Musculoskeletal: Mild deformity along the T2 superior endplate is stable. No acute bone abnormality. Review of the MIP images confirms the above findings. IMPRESSION: 1. Questionable small linear filling defect involving a right upper lobe pulmonary artery. This could represent a very small pulmonary embolism. However, there is no other evidence for pulmonary embolism suggesting that this filling defect could be artifactual. Recommend further characterization with lower extremity venous duplex to look for other evidence of thromboembolic disease. If lower extremity venous duplex is negative, consider repeat chest CTA. 2. Stable spiculated density in the right upper lobe. 3. Volume loss in the right lower lobe. Trace right pleural fluid particularly along the fissures. 4. Persistent upper abdominal ascites. 5. Aortic Atherosclerosis (ICD10-I70.0). Electronically Signed   By: Juliene Balder M.D.   On: 09/26/2023 15:48   DG Chest 1 View Result Date: 09/26/2023 CLINICAL DATA:  Chest pain and weakness. EXAM: CHEST  1 VIEW COMPARISON:  CT 07/25/2023.  Radiographs 04/08/2023 and 10/29/2022. FINDINGS: 1347 hours. Mild patient rotation to the left. Left IJ Port-A-Cath extends to the level of the upper right atrium, stable. The heart size and mediastinal contours are stable post median sternotomy and CABG. Grossly stable peripheral right upper lobe  nodularity and bibasilar scarring. No enlarging nodule, confluent airspace disease, pneumothorax or significant pleural effusion identified. The bones appear unchanged. IMPRESSION: Stable chest. No acute cardiopulmonary process. Grossly stable right upper lobe nodularity and bibasilar scarring. Electronically Signed   By: Elsie Perone M.D.   On: 09/26/2023 14:10       IMPRESSION/PLAN: 1. 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 Dr. Dewey has reviewed her imaging studies and course to date. While the patient has been enrolled in hospice care as she does not desire additional systemic therapy, the tumor's biology has been somewhat more indolent over time. We discussed the prognosis of untreated brain disease, versus considering options to try to locally control her disease. While this intervention of brain radiation would not be curative, she is motivated to proceed provided that her hospice benefits would be revoked. I have discussed her case with palliative medicine who has been in touch with [facility] and they will allow up to 5 fractions of radiotherapy. We discussed the most appropriate attempt to try to control her brain disease would be a course of fractionated stereotactic radiosurgery [facility]).  We discussed the need for a 3 Tesla MRI to ensure that she is a candidate for this type of therapy.  We also discussed the role of neurosurgery in the planning  and delivery of this style of treatment, the need for simulation and to create a mask for immobilization and at the conclusion of our discussion of these delivery and logistical considerations the patient remains motivated to proceed.  We discussed the risks, benefits, short, and long term effects of radiotherapy.  I will order her MRI stat to be performed and we will reach out to our special procedures navigator.  I have also discussed her case with Dr. Darnella who is on-call and he will plan  to see her later today.  She will sign written consent tomorrow to proceed with radiotherapy.   In a visit lasting 60 minutes, greater than 50% of the time was spent by phone and and floor time discussing the patient's condition, in preparation for the discussion, and coordinating the patient's care.   The above documentation reflects my direct findings during this shared patient visit. Please see the separate note by Dr. Dewey on this date for the remainder of the patient's plan of care.    Donald KYM Husband, [facility]   **Disclaimer: This note was dictated with voice recognition software. Similar sounding words can inadvertently be transcribed and this note may contain transcription errors which may not have been corrected upon publication of note.**

## 2023-09-29 NOTE — Assessment & Plan Note (Addendum)
 AM BMP K 4.3 much improved from K of 3.3 on admission, corrected with 40 mill equivalents K. - Will continue to follow

## 2023-09-29 NOTE — Discharge Instructions (Signed)
 Information on my medicine - ELIQUIS  (apixaban )  Why was Eliquis  prescribed for you? Eliquis  was prescribed to treat blood clots that may have been found in the veins of your legs (deep vein thrombosis) or in your lungs (pulmonary embolism) and to reduce the risk of them occurring again.  What do You need to know about Eliquis  ? The starting dose is 10 mg (two 5 mg tablets) taken TWICE daily for the FIRST SEVEN (7) DAYS, then on  10/05/23  the evening dose is reduced to ONE 5 mg tablet taken TWICE daily.  Eliquis  may be taken with or without food.   Try to take the dose about the same time in the morning and in the evening. If you have difficulty swallowing the tablet whole please discuss with your pharmacist how to take the medication safely.  Take Eliquis  exactly as prescribed and DO NOT stop taking Eliquis  without talking to the doctor who prescribed the medication.  Stopping may increase your risk of developing a new blood clot.  Refill your prescription before you run out.  After discharge, you should have regular check-up appointments with your healthcare provider that is prescribing your Eliquis .    What do you do if you miss a dose? If a dose of ELIQUIS  is not taken at the scheduled time, take it as soon as possible on the same day and twice-daily administration should be resumed. The dose should not be doubled to make up for a missed dose.  Important Safety Information A possible side effect of Eliquis  is bleeding. You should call your healthcare provider right away if you experience any of the following: Bleeding from an injury or your nose that does not stop. Unusual colored urine (red or dark brown) or unusual colored stools (red or black). Unusual bruising for unknown reasons. A serious fall or if you hit your head (even if there is no bleeding).  Some medicines may interact with Eliquis  and might increase your risk of bleeding or clotting while on Eliquis . To help  avoid this, consult your healthcare provider or pharmacist prior to using any new prescription or non-prescription medications, including herbals, vitamins, non-steroidal anti-inflammatory drugs (NSAIDs) and supplements.  This website has more information on Eliquis  (apixaban ): [email or web address] .com/eliquis dena

## 2023-09-29 NOTE — Progress Notes (Signed)
 Routine EEG completed, results pending Neurology review and interpretation

## 2023-09-29 NOTE — Hospital Course (Signed)
 Cassidy [name] is a 58 y.o.female with a history of stage IV adenocarcinoma of lung s/p chemo/radiation, CAD,tobacco use, T2DM, GERD, obstructive chronic bronchitis, anasarca, HFpEF with recent diagnosis of DVT who was admitted to the [facility] Teaching Service at [facility] for new metastatic cancer to brain. 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  MRI brain without contrast on admission found new 5 cm left parietal mass with prominent edema consistent with metastasis.  Started on dexamethasone  and neurologic symptoms resolved.  Palliative and radiation oncology were consulted and recommended performing stimulation and mask fit 9/19 at [facility]. She returned to [facility] [place] and was discharged to start [facility] radiotherapy outpatient next week.  Ascites History of malignant ascites with PleurX catheter system in place.  Reported a change in output to dark brown and smelly liquid.  Attempted to collect peritoneal fluid analyses, however they were unable to be collected and run.  Consider performing peritoneal fluid analyses as needed.  Right leg DVT DVT likely in the setting of Stage IV adenocarcinoma w/ brain mets.  Previously admitted to ED 9/15 and started on loading dose 10 mg Eliquis .  Loading dose was continued during hospitalization, edema gradually improved during hospitalization and patient was asymptomatic.  Continue Eliquis  10 mg BID till 9/21 and then transition to 5 mg BID daily thereafter.  Other chronic conditions were medically managed with home medications and formulary alternatives as necessary (T2DM, HTN, HLD, GERD, COPD, depression/anxiety)  PCP Follow-up Recommendations: Assist with outpatient radiation therapy next week and hospice planning. Adjust medications as needed symptomatically. Consider repeating peritoneal fluid analyses given lab confusion while at [facility] given the change in fluid appearance per patient Ensure transition to 5 mg BID dosing of eliquis  after 9/21 Consider further decrease of dexamethasone  8 mg BID to daily (changed from TID to BID at discharge after 3 days) Assess glucose control on 10 units of lantus  (on 24 units outpatient though sugars stable on 10 inpatient)

## 2023-09-29 NOTE — Assessment & Plan Note (Deleted)
 Vital signs stable overnight, currently on hospice care. Strength symmetric bilaterally today. CBC stable, BMP creatinine up slightly 1.01 > 1.21 but not criteria for AKI. MRI brain without contrast with new 5 cm left parietal mass with prominent edema consistent with diastasis. - EEG pending - Continue 8 mg Dexamethasone  TID, Received 8 mg dexamethasone  at 1638 and repeat at 5 AM, pending oncology recs for further treatment management - Pain regimen: Tylenol  500 mg every 6 hours - Continuing home pain regimen of morphine , oxycodone , fentanyl  patch increased to 50 mcg, gabapentin , duloxetine , hyoscyamine , and Voltaren  - Oncology consulted, appreciate recommendations - Radiation oncology to see her today to evaluate for possible palliative radiation therapy - Palliative consulted, appreciate recommendations - AM labs: CMP, CBC

## 2023-09-29 NOTE — Assessment & Plan Note (Signed)
 Receiving hospice/palliative care at [facility].

## 2023-09-29 NOTE — Assessment & Plan Note (Addendum)
 Home Lantus  dosing of 24 units daily.  Well-controlled A1c this a.m. 5.0.  Received 3 units correction over last 24 hours - Continue moderate SSI - Start 5 units Lantus  daily given low p.o. intake, advance pending cbg's

## 2023-09-29 NOTE — Assessment & Plan Note (Addendum)
 Diagnosed on 9/15 following US  done during ER visit. Prescribed Eliquis  10 mg BID for 7 days followed by 5 mg BID, which patient had not yet started. - Starting Eliquis  10 mg twice daily for 7 days followed by 5 mg twice daily

## 2023-09-29 NOTE — Consult Note (Signed)
 Neurosurgery Consult Note  Assessment:  58 y/o F w/ hx metastatic NSCLC s/p chemo/radiation c/b malignant ascites, previously was on hospice care, who presents with RLE weakness and tremors. Found to have new L parietal metastasis. Not a surgical candidate but interested in radiation  Plan:  I am happy to participate in the radiation planning. Will coordinate with rad onc Rest of cares per primary     CC: right leg weak  HPI:     Patient is a 58 y.o. female w/ hx metastatic NSCLC s/p chemo/radiation c/b malignant ascites, smoking, DM2 who was admitted with RLE weakness and tremors. Found to have new L parietal mass with surrounding vasogenic edema. Patient was previously on hospice care but reportedly is interested in pursuing radiation to the brain. She states she is not interested in any form of surgery. She denies focal motor/sensory deficit of the RUE/LUE/LLE, aphasia, facial droop   Patient Active Problem List   Diagnosis Date Noted   Weight loss, abnormal 09/29/2023   Metastatic cancer (HCC) 09/29/2023   Right sided weakness 09/28/2023   Right leg DVT (HCC) 09/28/2023   T2DM (type 2 diabetes mellitus) (HCC) 09/28/2023   Chronic back pain 04/10/2023   Chronic health problem 04/09/2023   Ascites 01/24/2023   Abdominal distension 12/28/2022   Chest pain 09/29/2022   Nausea and vomiting 09/29/2022   Pleural effusion 09/29/2022   Hypokalemia 09/29/2022   Hypomagnesemia 09/29/2022   Demand ischemia of myocardium (HCC) 09/29/2022   Anasarca 08/25/2022   Chronic idiopathic constipation 06/25/2022   Depression 03/10/2022   Port-A-Cath in place 02/17/2022   Need for influenza vaccination 10/27/2021   Urinary frequency 09/07/2021   Encounter for antineoplastic chemotherapy 09/02/2021   Encounter for antineoplastic immunotherapy 09/02/2021   Adenocarcinoma of right lung, stage 4 (HCC) 08/26/2021   Lung mass 08/20/2021   S/P CABG (coronary artery bypass graft) 06/30/2021    Shortness of breath 06/03/2021   Depressed mood 05/15/2021   Abdominal pain 07/21/2020   Type 2 diabetes mellitus (HCC) 06/11/2020   Back pain 06/11/2020   Anxiety 05/22/2020   Angina pectoris (HCC) 05/22/2020   Blood clotting disorder (HCC) 06/21/2019   Atherosclerotic heart disease of native coronary artery with other forms of angina pectoris (HCC) 06/21/2019   Obstructive chronic bronchitis with exacerbation (HCC)    Hyperlipidemia with target LDL less than 70    Fibromyalgia    PTSD (post-traumatic stress disorder)    CAD S/P percutaneous coronary angioplasty 11/02/2013   Leiomyoma of uterus, unspecified 07/20/2012   Essential hypertension 05/26/2012   Tobacco abuse 05/26/2012   Unstable angina pectoris (HCC) 05/26/2012   CARPAL TUNNEL SYNDROME, LEFT 12/18/2009   TINEA VERSICOLOR 04/06/2007   Moderate depressed bipolar I disorder (HCC) 11/24/2006   HERPES, GENITAL NEC 09/30/2006   SUBSTANCE ABUSE 09/30/2006   GERD 09/30/2006   PLANTAR FASCIITIS 09/30/2006   Past Medical History:  Diagnosis Date   Adenocarcinoma of right lung, stage 4 (HCC) 08/26/2021   Anemia    has sickle cell trait   Atherosclerotic heart disease of native coronary artery with angina pectoris (HCC) 05/2012, 10/2013   a.  s/p PCTA to dRCA and ostial RPAV-PLA vessel + PDA branch (05/2012)  b. [place]  s/p DES to mLAD - Resolute DES 3.0 x 22 (3.61mm -->3.3 mm)   Bipolar depression (HCC)    Breast abscess    a. right side.    COPD (chronic obstructive pulmonary disease) (HCC)    Depression    Diabetic peripheral neuropathy (  HCC)    Fibromyalgia    Genital warts    GERD (gastroesophageal reflux disease)    History of hiatal hernia    HLD (hyperlipidemia)    Hypertension    Lung cancer (HCC)    Pain in limb    a. LE VENOUS DUPLEX, 02/05/2009 - no evidence of deep vein thrombosis, Baker's cyst   Pneumonia    PTSD (post-traumatic stress disorder)    Sickle cell trait (HCC)    Tobacco abuse    Tooth caries     Type II diabetes mellitus (HCC)     Past Surgical History:  Procedure Laterality Date   BLADDER SURGERY  1980   TVT   BLADDER SURGERY  2003   TVT   BREAST EXCISIONAL BIOPSY     BREAST SURGERY Right    I&D for multiple abscesses   CARDIAC CATHETERIZATION     CORONARY ARTERY BYPASS GRAFT N/A 06/30/2021   Procedure: CORONARY ARTERY BYPASS GRAFTING (CABG) x3 USING LEFT INTERNAL MAMMARY ARTERY,  RIGHT RADIAL ARTERY AND LEFT GREATER SAPHENOUS VEIN;  Surgeon: Shyrl Linnie KIDD, MD;  Location: [facility];  Service: Open Heart Surgery;  Laterality: N/A;   cutting balloon     ENDOVEIN HARVEST OF GREATER SAPHENOUS VEIN Left 06/30/2021   Procedure: ENDOVEIN HARVEST OF GREATER SAPHENOUS VEIN;  Surgeon: Shyrl Linnie KIDD, MD;  Location: [facility] OR;  Service: Open Heart Surgery;  Laterality: Left;   EYE SURGERY Left    laser surgery   FINE NEEDLE ASPIRATION  08/21/2021   Procedure: FINE NEEDLE ASPIRATION (FNA) LINEAR;  Surgeon: Brenna Adine CROME, DO;  Location: [facility];  Service: Pulmonary;;   INCISE AND DRAIN ABCESS  ; 04/26/11; 08/13/11   right breast   INCISION AND DRAINAGE ABSCESS Right 05/09/2012   Procedure: INCISION AND DRAINAGE RIGHT BREAST ABSCESS;  Surgeon: Donnice POUR. Belinda, MD;  Location: [facility];  Service: General;  Laterality: Right;   INCISION AND DRAINAGE ABSCESS Right 02/08/2014   Procedure: INCISION AND DRAINAGE RIGHT BREAST ABSCESS;  Surgeon: Donnice Belinda, MD;  Location: [facility];  Service: General;  Laterality: Right;   INCISION AND DRAINAGE ABSCESS Right 06/14/2014   Procedure: INCISION AND DRAINAGE RIGHT BREAST ABSCESS;  Surgeon: Donnice Belinda, MD;  Location: [facility];  Service: General;  Laterality: Right;   IR IMAGING GUIDED PORT INSERTION  12/08/2021   IR PARACENTESIS  02/02/2023   IR PARACENTESIS  02/23/2023   IR PERC TUN PERIT CATH WO PORT S&I /IMAG  02/16/2023   IRRIGATION AND DEBRIDEMENT ABSCESS  12/19/2010   Procedure: IRRIGATION AND DEBRIDEMENT ABSCESS;  Surgeon: Donnice Bury, MD;  Location: [facility];  Service: General;  Laterality: Right;   IRRIGATION AND DEBRIDEMENT ABSCESS  08/13/2011   Procedure: MINOR INCISION AND DRAINAGE OF ABSCESS;  Surgeon: Lynwood KIDD Pina, MD;  Location: [facility];  Service: General;  Laterality: Right;  Right Breast    IRRIGATION AND DEBRIDEMENT ABSCESS  11/17/2011   Procedure: IRRIGATION AND DEBRIDEMENT ABSCESS;  Surgeon: Donnice POUR. Belinda, MD;  Location: [facility];  Service: General;  Laterality: Right;  irrigation and debridement right recurrent breast abscess   LARYNX SURGERY     LEFT HEART CATH AND CORONARY ANGIOGRAPHY N/A 04/09/2021   Procedure: LEFT HEART CATH AND CORONARY ANGIOGRAPHY;  Surgeon: Dann Candyce RAMAN, MD;  Location: [facility] INVASIVE CV LAB;  Service: Cardiovascular;  Laterality: N/A;   LEFT HEART CATHETERIZATION WITH CORONARY ANGIOGRAM N/A 05/29/2012   Procedure: LEFT HEART CATHETERIZATION WITH CORONARY ANGIOGRAM & PTCA;  Surgeon:  Debby DELENA Sor, MD;  Location: [facility] CATH LAB;  Service: Cardiovascular; Bifurcation dRCA-RPAD/PLA & rPDA  PTCA.   LEFT HEART CATHETERIZATION WITH CORONARY ANGIOGRAM N/A 11/08/2013   Procedure: LEFT HEART CATHETERIZATION WITH CORONARY ANGIOGRAM and Coronary Stent Intervention;  Surgeon: Alm LELON Clay, MD;  Location: [facility] CATH LAB;  Service: Cardiovascular; mLAD 80% (FFR 0.73) - resolute DES 3.0 x 22 mm (postdilated to 3.3 mm->3.5 mm)   NM MYOVIEW  LTD  01/26/2009   Normal study, no evidence of ischemia, EF 67%   ployp removed from voice box 03/30/12     RADIAL ARTERY HARVEST Right 06/30/2021   Procedure: RADIAL ARTERY HARVEST;  Surgeon: Shyrl Linnie KIDD, MD;  Location: [facility];  Service: Open Heart Surgery;  Laterality: Right;   REFRACTIVE SURGERY  ~ 2010   right   TEE WITHOUT CARDIOVERSION N/A 06/30/2021   Procedure: TRANSESOPHAGEAL ECHOCARDIOGRAM (TEE);  Surgeon: Shyrl Linnie KIDD, MD;  Location: [facility] OR;  Service: Open Heart Surgery;  Laterality: N/A;   TONSILLECTOMY  1988   VIDEO BRONCHOSCOPY WITH  ENDOBRONCHIAL ULTRASOUND Bilateral 08/21/2021   Procedure: VIDEO BRONCHOSCOPY WITH ENDOBRONCHIAL ULTRASOUND;  Surgeon: Brenna Adine CROME, DO;  Location: [facility];  Service: Pulmonary;  Laterality: Bilateral;    Medications Prior to Admission  Medication Sig Dispense Refill Last Dose/Taking   atorvastatin  (LIPITOR) 40 MG tablet TAKE 1 TABLET (40 MG TOTAL) BY MOUTH DAILY. 90 tablet 3 09/27/2023 Morning   busPIRone  (BUSPAR ) 7.5 MG tablet Take 7.5 mg by mouth 2 (two) times daily.   09/27/2023 Evening   Carboxymethylcellul-Glycerin (LUBRICATING EYE DROPS OP) Place 1 drop into both eyes daily as needed (dry eyes).   Past Week   carvedilol  (COREG ) 6.25 MG tablet Take 2 tablets (12.5 mg total) by mouth 2 (two) times daily with a meal. 60 tablet 0 09/27/2023 Evening   diclofenac  Sodium (VOLTAREN ) 1 % GEL Apply 1 application topically 3 times daily as needed for pain 200 g 1 Past Week   docusate sodium  (COLACE) 100 MG capsule Take 1 capsule (100 mg total) by mouth daily. 10 capsule 0 09/27/2023 Morning   DULoxetine  (CYMBALTA ) 30 MG capsule Take 1 capsule (30 mg total) by mouth daily. 30 capsule 5 09/27/2023 Morning   fentaNYL  (DURAGESIC ) 25 MCG/HR Place 1 patch onto the skin every 3 (three) days. 10 patch 0 09/27/2023 Morning   ferrous sulfate  325 (65 FE) MG tablet Take 1 tablet (325 mg total) by mouth daily with breakfast. 30 tablet 2 09/27/2023 Morning   fluticasone  (FLONASE ) 50 MCG/ACT nasal spray PLACE 1 SPRAY INTO BOTH NOSTRILS DAILY AS NEEDED (CONGESTION). 16 g 1 Past Week   folic acid  (FOLVITE ) 1 MG tablet TAKE 1 TABLET (1 MG TOTAL) BY MOUTH DAILY. 30 tablet 3 09/27/2023 Morning   gabapentin  (NEURONTIN ) 300 MG capsule Take 2 capsules (600 mg total) by mouth 3 (three) times daily. 180 capsule 2 09/27/2023 Evening   hyoscyamine  (LEVSIN  SL) 0.125 MG SL tablet Place 1 tablet (0.125 mg total) under the tongue every 6 (six) hours as needed. For abdominal cramping 30 tablet 0 Past Week   insulin  glargine (LANTUS )  100 UNIT/ML Solostar Pen Inject 24 Units into the skin every morning. 3 mL 11 Past Week   ipratropium (ATROVENT  HFA) 17 MCG/ACT inhaler Inhale 2 puffs into the lungs every 4 (four) hours as needed for wheezing (COPD).   Past Week   lactulose  (CHRONULAC ) 10 GM/15ML solution Take 15 mLs (10 g total) by mouth daily as needed for moderate constipation or severe  constipation. 236 mL 0 Past Week   lidocaine -prilocaine  (EMLA ) cream APPLY 1 APPLICATION TOPICALLY AS NEEDED. (Patient taking differently: Apply 1 Application topically daily as needed (for port access).) 30 g 2 Past Week   LORazepam  (ATIVAN ) 0.5 MG tablet Take 1 tablet (0.5 mg total) by mouth every 8 (eight) hours as needed for anxiety (nausea). 30 tablet 0 Past Week   metoCLOPramide  (REGLAN ) 10 MG tablet Take 1 tablet (10 mg total) by mouth every 8 (eight) hours as needed for nausea. 60 tablet 2 Past Week   mometasone -formoterol  (DULERA ) 100-5 MCG/ACT AERO Inhale 2 puffs into the lungs in the morning and at bedtime. 13 g 3 09/27/2023 Bedtime   Morphine  Sulfate (MORPHINE  CONCENTRATE) 10 mg / 0.5 ml concentrated solution Take 0.25 mLs by mouth every 6 (six) hours as needed for moderate pain (pain score 4-6).   09/27/2023 Morning   nystatin  (MYCOSTATIN ) 100000 UNIT/ML suspension Take 5 mLs (500,000 Units total) by mouth 4 (four) times daily. 473 mL 2 09/27/2023 Evening   ondansetron  (ZOFRAN -ODT) 8 MG disintegrating tablet Take 1 tablet (8 mg total) by mouth every 8 (eight) hours as needed for nausea or vomiting. 30 tablet 2 Past Week   oxyCODONE  (ROXICODONE ) 15 MG immediate release tablet Take 1-2 tablets (15-30 mg total) by mouth every 4 (four) hours as needed for pain. 120 tablet 0 09/27/2023 Morning   pantoprazole  (PROTONIX ) 40 MG tablet Take 1 tablet (40 mg total) by mouth 2 (two) times daily. 60 tablet 5 09/27/2023 Evening   polyethylene glycol powder (GAVILAX) 17 GM/SCOOP powder Take 17 g by mouth daily. 850 g 1 09/27/2023 Morning   torsemide   (DEMADEX ) 20 MG tablet Take 2 tablets (40 mg total) by mouth daily. 35 tablet 0 09/27/2023 Morning   valACYclovir  (VALTREX ) 1000 MG tablet Take for 3 days prn each outbreak. 30 tablet 11 Past Week   APIXABAN  (ELIQUIS ) VTE STARTER PACK (10MG  AND 5MG ) Take as directed on package: start with two-5mg  tablets twice daily for 7 days. On day 8, switch to one-5mg  tablet twice daily. (Patient not taking: Reported on 09/28/2023) 74 each 0 Not Taking   glucose blood (ACCU-CHEK AVIVA PLUS) test strip USE AS INSTRUCTED EVERY DAY 50 each 1    Insulin  Pen Needle 32G X 4 MM MISC 1 Needle by Does not apply route in the morning and at bedtime. 120 each 2    Allergies  Allergen Reactions   Amoxil  [Amoxicillin ] Hives   Chantix [Varenicline] Other (See Comments)    Nightmares Hallucinations     Suboxone [Buprenorphine Hcl-Naloxone Hcl] Nausea And Vomiting   Emend [Fosaprepitant  Dimeglumine] Itching    See notes for transfusion on 8/23. Pt reported new onset of itching after Emend infusion was started. 25mg  Benadryl  IV given per MD. Pt tolerated remaining infusion well.     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  Objective:   Patient Vitals for the past 8 hrs:  BP Temp Temp src Pulse Resp SpO2  09/29/23 1617 103/70 98 F (36.7 C) Oral 83 17 100 %   I/O last 3 completed shifts: In: 240 [P.O.:240] Out: 400 [Urine:400] No intake/output data recorded.    Exam: GCS 15 Conjugate gaze Smile symmetric No aphasia or dysarthria RUE, LUE, LLE full strength and sensation RLE: 3/5 throughout. Normal sensation    Data ReviewCBC:  Lab Results  Component Value Date   WBC 5.8 09/29/2023   RBC 3.76 (L) 09/29/2023   BMP:  Lab Results  Component Value Date   GLUCOSE 160 (H) 09/29/2023   GLUCOSE 157 (H) 09/29/2023   CO2 24 09/29/2023   CO2 21 (L) 09/29/2023   BUN 16 09/29/2023   BUN 16 09/29/2023   BUN 16 03/31/2023   CREATININE 1.23 (H) 09/29/2023   CREATININE 1.22 (H) 09/29/2023   CREATININE 1.04 (H) 01/19/2023   CREATININE 0.65 11/06/2013   CALCIUM  8.2 (L) 09/29/2023   CALCIUM  8.3 (L) 09/29/2023

## 2023-09-29 NOTE — Progress Notes (Signed)
 3T MRI completed this evening. I called and discussed results with the patient and confirmed our plans for 5 fractions of SRS radiotherapy. She has met with Dr. Darnella and will simulate tomorrow. We anticipate her treatment will begin next week as an outpatient. I will check in with her next week and follow along with her dexamethasone  dosing which she is aware she should remain on while receiving radiation, and that I will help coordinate with hospice after treatment.     Donald KYM Husband, PAC

## 2023-09-29 NOTE — Progress Notes (Addendum)
 Daily Progress Note Intern Pager: [phone]  Patient name: Cassidy [name] Medical record number: [id number] Date of birth: Mar 01, 1965 Age: 58 y.o. Gender: female  Primary Care Provider: Lorrane Pac, MD Consultants: Oncology, radiology oncology, palliative Code Status: DNR-I  Pt Overview and Major Events to Date:  9/17- Admitted  Medical Decision Making:  This is a 58 year old female with past medical history of stage IV adenocarcinoma lung s/p chemo/radiation, CAD, tobacco use, T2DM, GERD, obstructive chronic bronchitis, anasarca, HFpEF with recent diagnosis of DVT presenting with weakness and tremors found to have imaging consistent with new metastatic cancer with brain mets. Assessment & Plan Right sided weakness Weight loss, abnormal Metastatic cancer (HCC) Vital signs stable overnight, currently on hospice care. Strength symmetric bilaterally today. CBC stable, BMP creatinine up slightly 1.01 > 1.21 but not criteria for AKI. MRI brain without contrast with new 5 cm left parietal mass with prominent edema consistent with metastasis. Has lost 60 lbs in last year or so. - EEG pending - Continue 8 mg Dexamethasone  TID, Received 8 mg dexamethasone  at 1638 and repeat at 5 AM, pending oncology recs for further treatment management - Pain regimen: Tylenol  500 mg every 6 hours - Continuing home pain regimen of morphine , oxycodone , fentanyl  patch increased to 50 mcg, gabapentin , duloxetine , hyoscyamine , and Voltaren  - Oncology consulted, appreciate recommendations - Radiation oncology to see her today to evaluate for possible palliative radiation therapy - Palliative consulted, appreciate recommendations - AM labs: CMP, CBC Ascites Known malignant ascites.  Patient with new dark color to ascites, with no change or worsening of symptoms. - Continue home torsemide  40 mg daily - Consider spironolactone  pending CMP, appears patient reported she was not taking this to the pharmacy  technician this admission - Pending paracentesis labs Right leg DVT (HCC) Diagnosed on 9/15 following US  done during ER visit. Prescribed Eliquis  10 mg BID for 7 days followed by 5 mg BID, which patient had not yet started. - Starting Eliquis  10 mg twice daily for 7 days followed by 5 mg twice daily T2DM (type 2 diabetes mellitus) (HCC) Home Lantus  dosing of 24 units daily.  Well-controlled A1c this a.m. 5.0.  Received 3 units correction over last 24 hours - Continue moderate SSI - Start 5 units Lantus  daily given low p.o. intake, advance pending cbg's Hypokalemia AM BMP K 4.3 much improved from K of 3.3 on admission, corrected with 40 mill equivalents K. - Will continue to follow Chronic health problem HTN: Continue home carvedilol  12.5 mg twice daily HLD: Continue home Lipitor 40 mg GERD: Continue home pantoprazole  40 mg twice daily COPD: Continue home Breo Ellipta  1 puff daily, ipratropium 0.5 mg every 4 hours as needed Depression/Anxiety: Continue home Buspar  7.5 3 times daily Adenocarcinoma of right lung, stage 4 (HCC) Re[facility]all[facility].  FEN/GI: Regular diet PPx: Home apixaban  Dispo:Pending PT recommendations  pending clinical improvement . Barriers include stable neurologic status, rad oncology recs.   Subjective:  Saw patient at bedside, she reports that she is still very shocked about the diagnosis.  Informed her about the current plan for radiation oncology to see her and discuss possible management.  She states that her foot is dragging a little bit but much improved from yesterday.  No fevers, nausea, vomiting, abdominal pain.  Reports that she has a hernia in her abdomen that has been there for a while that hurts sometimes to.  She was intermittently tearful and says that her depression comes and goes  in strong waves. Does report she has had unplanned 40 pound weight loss over the past year or so.  Objective: Temp:  [97.8 F (36.6 C)-98.2  F (36.8 C)] 97.9 F (36.6 C) (09/18 0344) Pulse Rate:  [72-85] 83 (09/18 0344) Resp:  [14-18] 16 (09/18 0344) BP: (106-152)/(55-90) 106/77 (09/18 0344) SpO2:  [97 %-100 %] 100 % (09/18 0344) Weight:  [72.6 kg] 72.6 kg (09/17 1124)  Physical Exam: General: Well-appearing, and in good spirits but intermittently tearful about diagnosis Cardiovascular: Regular rate rhythm, no murmurs rubs or gallops Respiratory: Expiratory rhonchi bilaterally of lower lobes. Abdomen: Mildly tender around peri-umbilical hernia, grape size hard mass where patient reports there is a history of hernia, active bowel sounds Neuro: Aox3, equal and symmetric abduction/adduction of UE 5/5, Grip strength 5/5 bilaterally Extremities: Bilateral pitting edema 3+ below knees right worse than left  Laboratory: Most recent CBC Lab Results  Component Value Date   WBC 8.2 09/28/2023   HGB 11.5 (L) 09/28/2023   HCT 35.1 (L) 09/28/2023   MCV 90.9 09/28/2023   PLT 367 09/28/2023   Most recent BMP    Latest Ref Rng & Units 09/28/2023   12:56 PM  BMP  Glucose 70 - 99 mg/dL 892   BUN 6 - 20 mg/dL 10   Creatinine 9.55 - 1.00 mg/dL 8.98   Sodium 864 - 854 mmol/L 138   Potassium 3.5 - 5.1 mmol/L 3.3   Chloride 98 - 111 mmol/L 103   CO2 22 - 32 mmol/L 22   Calcium  8.9 - 10.3 mg/dL 8.1     Other pertinent labs A1c 5.0, TSH 0.880, HIV nonreactive  Imaging/Diagnostic Tests: MRI brain without contrast IMPRESSION: New 5 cm left parietal mass with prominent edema most consistent with a metastasis. Postcontrast imaging is recommended for complete staging.  Lorrane Pac, MD 09/29/2023, 7:09 AM  PGY-1, [facility] FPTS Intern pager: [phone], text pages welcome Secure chat group [facility] Teaching Service

## 2023-09-29 NOTE — Consult Note (Signed)
 Consultation Note Date: 09/29/2023   Patient Name: Cassidy [name]  DOB: 1965-09-21  MRN: [id number]  Age / Sex: 58 y.o., female  PCP: Lorrane Pac, MD Referring Physician: McDiarmid, Krystal JONETTA, MD  Reason for Consultation: Establishing goals of care  HPI/Patient Profile: 58 y.o. female  with past medical history of stage IV adenocarcinoma lung s/p chemo/radiation, CAD, tobacco use, T2DM, GERD, obstructive chronic bronchitis, anasarca, HFpEF with recent diagnosis of DVT admitted on 09/28/2023 with weakness and tremors found to have imaging consistent with new metastatic cancer with brain mets. Last cancer treatment 2024. Patient currently receiving hospice care through [facility]. Was previously followed by outpatient palliative care at the [facility] cancer center. PMT consulted for GOC discussions.  Clinical Assessment and Goals of Care: I have reviewed medical records including EPIC notes, labs and imaging, received report from RN, assessed the patient and then met with patient  to discuss diagnosis prognosis, GOC, EOL wishes, disposition and options.  I discussed care with Levon, NP who knows her well through outpatient palliative at the cancer center.   I introduced Palliative Medicine as specialized medical care for people living with serious illness. It focuses on providing relief from the symptoms and stress of a serious illness. The goal is to improve quality of life for both the patient and the family.  She is familiar with palliative team - tells me about her relationship with outpatient palliative through the cancer center.   We discussed a brief life review of the patient. She tells me about her only son Cassidy [name]. She tells me about her supportive neighbors.   As far as functional and nutritional status, she tells me she has been doing well at home. Able to care for herself. Does get some assistance with daily cares from her hospice aide.  She tells me of consistent poor appetite. Has lost about 50 pounds.    She tells me that though she has not continued with traditional medical therapies she has been using holistic care with the help of family members. She tells me about inflammation drops her cousin made and provided to her.   We discussed patient's current illness and what it means in the larger context of patient's on-going co-morbidities.  We discussed progression of her cancer to brain.   I attempted to elicit values and goals of care important to the patient.    She is clear about a few things. 1. She wants to continue hospice services through [facility], she has had a good experience. She understands goals of hospice care are to focus on quality of life and comfort, avoid aggressive medical are.  2. She is open to radiation if she is a candidate, would like to speak to radiation oncology about it, worries about how this would affect her hospice services.   We discuss the last time she received radiation - she tells me it did not go well and she suffered a lot of side effects. We discuss what she hopes to gain from radiation this time - she is hoping it will prolong her life and help with the new symptoms she has acquired (unsteadiness, tremors, headaches).   We discuss uncertainty of what radiation would gain her and what side effects may be. Encouraged her to ask these questions to radiation oncology and consider quality of life.   I share with her I will contact her hospice agency regarding radiation therapy. She agrees.   The difference between aggressive medical intervention and comfort care was  considered in light of the patient's goals of care.   She confirms her DNR/DNI code status.  She tells me if she couldn't make decisions for herself she would want her next of kin to make them - this is her son Cassidy [name].   Discussed with Corliss the importance of continued conversation with family and the medical  providers regarding overall plan of care and treatment options, ensuring decisions are within the context of the patient's values and GOCs.    Questions and concerns were addressed. The family was encouraged to call with questions or concerns.   **Discussed with [facility], [facility], [facility] - per their liaison, patient could remain under their care with limited amount of radiation treatments  ** Discussed above conversation with patient and [facility] with Donald Husband with radiation oncology - she plans to see later today  Primary Decision Maker PATIENT NOK, son Cassidy [name], would be decision maker if patient were unable  SUMMARY OF RECOMMENDATIONS   DNR/DNI confirmed Patient currently receives hospice care under [facility] and wants to continue this She is open to radiation - wants to discuss with radiation oncology Per hospice agency, could continue with hospice care with limited amount of radiation treatments  If care team would like to speak with her hospice liaison she can be reached at [phone] [facility], Landscape architect)  Code Status/Advance Care Planning: DNR     Primary Diagnoses: Present on Admission:  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 The following aspects are pertinent.  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  s/p PCTA to dRCA and ostial RPAV-PLA vessel + PDA branch (05/2012)  b. [place]  s/p DES to mLAD - Resolute DES 3.0 x 22 (3.25mm -->3.3 mm)   Bipolar depression (HCC)    Breast abscess    a. right side.    COPD (chronic  obstructive pulmonary disease) (HCC)    Depression    Diabetic peripheral neuropathy (HCC)    Fibromyalgia    Genital warts    GERD (gastroesophageal reflux disease)    History of hiatal hernia    HLD (hyperlipidemia)    Hypertension    Lung cancer (HCC)    Pain in limb    a. LE VENOUS DUPLEX, 02/05/2009 - no evidence of deep vein thrombosis, Baker's cyst   Pneumonia    PTSD (post-traumatic stress disorder)    Sickle cell trait (HCC)    Tobacco abuse    Tooth caries    Type II diabetes mellitus (HCC)    Social History   Socioeconomic History   Marital status: Single    Spouse name: Not on file   Number of children: 1   Years of education: Not on file   Highest education level: Not on file  Occupational History   Occupation: homemaker   Occupation: disabled  Tobacco Use   Smoking status: Former    Current packs/day: 0.50    Average packs/day: 0.5 packs/day for 44.7 years (22.4 ttl pk-yrs)    Types: Cigarettes    Start date: 01/12/1979   Smokeless  tobacco: Never   Tobacco comments:    PT STATES THAT SHE CHEWS THE GUM FOR SMOKING AND NO LONGER SMOKES  Vaping Use   Vaping status: Never Used  Substance and Sexual Activity   Alcohol  use: No    Alcohol /week: 0.0 standard drinks of alcohol     Comment: recovering addict; 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   Social Drivers of Corporate investment banker Strain: High Risk (09/08/2021)   Overall Financial Resource Strain (CARDIA)    Difficulty of Paying Living Expenses: Very hard   Food Insecurity: No Food Insecurity (09/28/2023)   Hunger Vital Sign    Worried About Running Out of Food in the Last Year: Never true    Ran Out of Food in the Last Year: Never true  Transportation Needs: No Transportation Needs (09/28/2023)   PRAPARE - Administrator, Civil Service (Medical): No    Lack of Transportation (Non-Medical): No  Physical Activity: Not on file  Stress: Not on file  Social Connections: Not on file   Family History  Problem Relation Age of Onset   Esophageal cancer Mother    COPD Mother    Cancer Mother        lymphoma   Heart disease Mother    Stomach cancer Mother    Hyperlipidemia Father    Heart disease Father    Cancer Maternal Aunt        breast, colon   Crohn's disease Maternal Aunt    Hypertension Maternal Grandmother    Diabetes Maternal Grandmother    Hypertension Brother    Rectal cancer Neg Hx    Liver cancer Neg Hx    Colon cancer Neg Hx    Breast cancer Neg Hx    Scheduled Meds:  acetaminophen   500 mg Oral Q6H   apixaban   10 mg Oral BID   Followed by   NOREEN ON 10/05/2023] apixaban   5 mg Oral BID   atorvastatin   40 mg Oral Daily   busPIRone   7.5 mg Oral BID   carvedilol   12.5 mg Oral BID WC   Chlorhexidine  Gluconate Cloth  6 each Topical Daily   diclofenac  Sodium  4 g Topical QID   docusate sodium   100 mg Oral Daily   DULoxetine   30 mg Oral Daily   feeding supplement  237 mL Oral BID BM   fentaNYL   1 patch Transdermal Q72H   ferrous sulfate   325 mg Oral Q breakfast   fluticasone  furoate-vilanterol  1 puff Inhalation Daily   folic acid   1 mg Oral Daily   gabapentin   600 mg Oral TID   insulin  aspart  0-15 Units Subcutaneous TID WC   nystatin   5 mL Oral QID   pantoprazole   40 mg Oral BID   polyethylene glycol  17 g Oral Daily   torsemide   40 mg Oral Daily   Continuous Infusions: PRN Meds:.artificial tears, fluticasone , hyoscyamine , ipratropium, lactulose , lidocaine -prilocaine , LORazepam , metoCLOPramide ,  morphine  CONCENTRATE, ondansetron , oxyCODONE , sodium chloride  flush Allergies  Allergen Reactions   Amoxil  [Amoxicillin ] Hives   Chantix [Varenicline] Other (See Comments)    Nightmares Hallucinations     Suboxone [Buprenorphine Hcl-Naloxone Hcl] Nausea And Vomiting   Emend [Fosaprepitant  Dimeglumine] Itching    See notes for transfusion on 8/23. Pt reported new onset of itching after Emend infusion was started. 25mg  Benadryl  IV given per MD. Pt tolerated remaining infusion well.    Review of Systems  Constitutional:  Positive for activity change and appetite change.  Neurological:  Positive for tremors, weakness and headaches.    Physical Exam Constitutional:      General: She is not in acute distress.    Appearance: She is ill-appearing.  Pulmonary:     Effort: Pulmonary effort is normal.  Skin:    General: Skin is warm and dry.  Neurological:     Mental Status: She is alert.     Vital Signs: BP 119/84 (BP Location: Left Arm)   Pulse 70   Temp 97.7 F (36.5 C) (Oral)   Resp 18   Ht 5' 7 (1.702 m)   Wt 72.6 kg   LMP 06/12/2016 (Approximate)   SpO2 100%   BMI 25.06 kg/m  Pain Scale: 0-10   Pain Score: Asleep   SpO2: SpO2: 100 % O2 Device:SpO2: 100 % O2 Flow Rate: .   IO: Intake/output summary:  Intake/Output Summary (Last 24 hours) at 09/29/2023 1057 Last data filed at 09/28/2023 2120 Gross per 24 hour  Intake 240 ml  Output 400 ml  Net -160 ml    LBM: Last BM Date : 09/29/23 Baseline Weight: Weight: 72.6 kg Most recent weight: Weight: 72.6 kg     Palliative Assessment/Data:PPS 60%     *Please note that this is a verbal dictation therefore any spelling or grammatical errors are due to the [employer] One system interpretation.   Time Total: 80 minutes Time spent includes: Detailed review of medical records (labs, imaging, vital signs), medically appropriate exam, discussion with treatment team, counseling and educating patient, family and/or  staff, documenting clinical information, medication management and coordination of care.    Tobey Jama Barnacle, DNP, AGNP-C Palliative Medicine Team [phone] Pager: [phone]

## 2023-09-29 NOTE — Procedures (Addendum)
 Patient Name: Cassidy [name]  MRN: [id number]  Epilepsy Attending: Arlin MALVA Krebs  Referring Physician/Provider: Tharon Lung, MD  Date: 09/29/2023 Duration: 27.52 mins  Patient history: 58yo female with tremors in setting of new brain metastasis. EEG to evaluate for seizure  Level of alertness: Awake, asleep  AEDs during EEG study: GBP  Technical aspects: This EEG study was done with scalp electrodes positioned according to the 10-20 International system of electrode placement. Electrical activity was reviewed with band pass filter of 1-70Hz , sensitivity of 7 uV/mm, display speed of 38mm/sec with a 60Hz  notched filter applied as appropriate. EEG data were recorded continuously and digitally stored.  Video monitoring was available and reviewed as appropriate.  Description: The posterior dominant rhythm consists of 9 Hz activity of moderate voltage (25-35 uV) seen predominantly in posterior head regions, symmetric and reactive to eye opening and eye closing. Sleep was characterized by vertex waves, sleep spindles (12 to 14 Hz), maximal frontocentral region. EEG showed continuous 3 to 6 Hz theta-delta slowing in left temporo-parietal region. Hyperventilation and photic stimulation were not performed.     ABNORMALITY - Continuous slow, left temporo-parietal region  IMPRESSION: This study is suggestive of cortical dysfunction arising from left temporo-parietal region likely secondary to underlying structural abnormality. No seizures or definite epileptiform discharges were seen throughout the recording.  Please note lack of epileptiform activity during interictal EEG does not exclude the diagnosis of epilepsy.   [name]

## 2023-09-29 NOTE — Evaluation (Signed)
 Physical Therapy Evaluation Patient Details Name: Cassidy [name] MRN: [id number] DOB: [birth year] Today's Date: 09/29/2023  History of Present Illness  Pt is a 58 y/o F admitted on 09/28/23 after presenting with c/o weakness & tremors. Symptoms likely 2/2 new metastatic disease. PMH: stage IV non small cell lung CA s/p palliative radiation & chemotherapy with brain mets, DM2, HTN, HLD, GERD, BPD/depression, CAD s/p CABG, HFpEF, recent diagnosis of RLE DVT  Clinical Impression  Pt seen for PT evaluation with pt agreeable. Pt is a very pleasant lady, eager to return home with [facility] therapy services. On this date, pt presents with R sided weakness. Pt is able to ambulate in hallway with rollator with cuing re: increased dorsiflexion & heel strike RLE with good return demo until RLE fatigues. Pt negotiates 8 steps with B rails & CGA with cuing/education re: compensatory pattern. Discussed TTB & use of BSC with pt's friend present, saying he likely could provide her with TTB. Will continue to follow pt acutely to progress mobility as able to reduce fall risk & maintain independence.        If plan is discharge home, recommend the following: A little help with walking and/or transfers;A little help with bathing/dressing/bathroom;Assistance with cooking/housework;Assist for transportation;Help with stairs or ramp for entrance   Can travel by private vehicle        Equipment Recommendations BSC/3in1;Other (comment) (tub transfer bench (may be able to borrow from friend))  Recommendations for Other Services       Functional Status Assessment Patient has had a recent decline in their functional status and demonstrates the ability to make significant improvements in function in a reasonable and predictable amount of time.     Precautions / Restrictions Precautions Precautions: Fall Restrictions Weight Bearing Restrictions Per Provider Order: No      Mobility  Bed Mobility Overal bed mobility:  Modified Independent             General bed mobility comments: supine>sit    Transfers Overall transfer level: Needs assistance Equipment used: Rollator (4 wheels) Transfers: Sit to/from Stand Sit to Stand: Supervision           General transfer comment: sit>stand from EOB    Ambulation/Gait Ambulation/Gait assistance: Supervision Gait Distance (Feet): 150 Feet Assistive device: Rollator (4 wheels) Gait Pattern/deviations: Decreased dorsiflexion - right, Decreased stride length, Decreased step length - right Gait velocity: decreased     General Gait Details: PT provides cuing for increased dorsiflexion & heel strike with fair return demo but pt will begin to drag R foot, reporting it becomes heavy as she fatigues. Pt able to compensate with increased R hip flexion during swing phase as well. Pt notes slight LOB x 3 but no overt LOB noted by PT.  Stairs Stairs: Yes Stairs assistance: Contact guard assist Stair Management: Two rails, Step to pattern Number of Stairs: 8 (6) General stair comments: PT educates pt on compensatory pattern with min cuing throughout.  Wheelchair Mobility     Tilt Bed    Modified Rankin (Stroke Patients Only)       Balance Overall balance assessment: Needs assistance Sitting-balance support: Feet supported Sitting balance-Leahy Scale: Good     Standing balance support: During functional activity, Bilateral upper extremity supported, Reliant on assistive device for balance Standing balance-Leahy Scale: Good                               Pertinent  Vitals/Pain Pain Assessment Pain Assessment: Faces Faces Pain Scale: Hurts a little bit Pain Location: anterior R knee tendon Pain Descriptors / Indicators: Discomfort, Tightness Pain Intervention(s): Monitored during session    Home Living Family/patient expects to be discharged to:: Private residence Living Arrangements: Alone Available Help at Discharge:  Family;Friend(s);Available PRN/intermittently Type of Home: Apartment Home Access: Stairs to enter Entrance Stairs-Rails: Right;Left;Can reach both Entrance Stairs-Number of Steps: 6-8   Home Layout: One level Home Equipment: Agricultural consultant (2 wheels);Rollator (4 wheels);Shower seat      Prior Function Prior Level of Function : Independent/Modified Independent             Mobility Comments: Ambulatory with rollator, notes 1 fall prior to admission. ADLs Comments: Has home hospice services that assist with bathing back & feet but will not assist her with tub transfers. Cooks & cleans without assistance. Wears briefs 2/2 urgency incontinence.     Extremity/Trunk Assessment   Upper Extremity Assessment Upper Extremity Assessment: Defer to OT evaluation;Left hand dominant    Lower Extremity Assessment Lower Extremity Assessment: RLE deficits/detail RLE Deficits / Details: RLE edema (recent hx of DVT), 3/5 knee extension in sitting, 2-/5 hip flexion in sitting, 3/5 dorsiflexion       Communication   Communication Communication: No apparent difficulties    Cognition Arousal: Alert Behavior During Therapy: WFL for tasks assessed/performed   PT - Cognitive impairments: No apparent impairments                       PT - Cognition Comments: very sweet lady Following commands: Intact       Cueing Cueing Techniques: Verbal cues     General Comments      Exercises     Assessment/Plan    PT Assessment Patient needs continued PT services  PT Problem List Decreased strength;Decreased coordination;Decreased range of motion;Decreased activity tolerance;Decreased balance;Decreased mobility;Decreased knowledge of use of DME       PT Treatment Interventions Balance training;Gait training;Neuromuscular re-education;DME instruction;Stair training;Functional mobility training;Therapeutic activities;Therapeutic exercise;Patient/family education    PT Goals (Current  goals can be found in the Care Plan section)  Acute Rehab PT Goals Patient Stated Goal: reduce fall risk, return home, therapy at home PT Goal Formulation: With patient Time For Goal Achievement: 10/13/23 Potential to Achieve Goals: Good    Frequency Min 2X/week     Co-evaluation               AM-PAC PT 6 Clicks Mobility  Outcome Measure Help needed turning from your back to your side while in a flat bed without using bedrails?: None Help needed moving from lying on your back to sitting on the side of a flat bed without using bedrails?: None Help needed moving to and from a bed to a chair (including a wheelchair)?: A Little Help needed standing up from a chair using your arms (e.g., wheelchair or bedside chair)?: A Little Help needed to walk in hospital room?: A Little Help needed climbing 3-5 steps with a railing? : A Little 6 Click Score: 20    End of Session   Activity Tolerance: Patient tolerated treatment well Patient left: in bed;with call bell/phone within reach;with family/visitor present   PT Visit Diagnosis: Other abnormalities of gait and mobility (R26.89);Difficulty in walking, not elsewhere classified (R26.2);Other symptoms and signs involving the nervous system (R29.898);Muscle weakness (generalized) (M62.81)    Time: 8597-8572 PT Time Calculation (min) (ACUTE ONLY): 25 min   Charges:  PT Evaluation $PT Eval Moderate Complexity: 1 Mod   PT General Charges $$ ACUTE PT VISIT: 1 Visit         Richerd Pinal, PT, DPT 09/29/23, 2:36 PM   Richerd CHRISTELLA Pinal 09/29/2023, 2:36 PM

## 2023-09-29 NOTE — Assessment & Plan Note (Signed)
 HTN: Continue home carvedilol  12.5 mg twice daily HLD: Continue home Lipitor 40 mg GERD: Continue home pantoprazole  40 mg twice daily COPD: Continue home Breo Ellipta  1 puff daily, ipratropium 0.5 mg every 4 hours as needed Depression/Anxiety: Continue home Buspar  7.5 3 times daily

## 2023-09-29 NOTE — Assessment & Plan Note (Signed)
" >>  ASSESSMENT AND PLAN FOR PRIMARY MALIGNANT NEOPLASM OF LUNG WITH METASTASIS TO BRAIN (HCC) WRITTEN ON 09/29/2023  2:49 PM BY LORRANE PAC, MD  Vital signs stable overnight, currently on hospice care. Strength symmetric bilaterally today. CBC stable, BMP creatinine up slightly 1.01 > 1.21 but not criteria for AKI. MRI brain without contrast with new 5 cm left parietal mass with prominent edema consistent with metastasis. Has lost 60 lbs in last year or so. - EEG pending - Continue 8 mg Dexamethasone  TID, Received 8 mg dexamethasone  at 1638 and repeat at 5 AM, pending oncology recs for further treatment management - Pain regimen: Tylenol  500 mg every 6 hours - Continuing home pain regimen of morphine , oxycodone , fentanyl  patch increased to 50 mcg, gabapentin , duloxetine , hyoscyamine , and Voltaren  - Oncology consulted, appreciate recommendations - Radiation oncology to see her today to evaluate for possible palliative radiation therapy - Palliative consulted, appreciate recommendations - AM labs: CMP, CBC "

## 2023-09-29 NOTE — Assessment & Plan Note (Addendum)
 Known malignant ascites.  Patient with new dark color to ascites, with no change or worsening of symptoms. - Continue home torsemide  40 mg daily - Consider spironolactone  pending CMP, appears patient reported she was not taking this to the pharmacy technician this admission - Pending paracentesis labs

## 2023-09-30 ENCOUNTER — Other Ambulatory Visit (HOSPITAL_COMMUNITY): Payer: Self-pay

## 2023-09-30 ENCOUNTER — Encounter: Payer: Self-pay | Admitting: Internal Medicine

## 2023-09-30 ENCOUNTER — Ambulatory Visit: Admitting: Radiation Oncology

## 2023-09-30 DIAGNOSIS — Z510 Encounter for antineoplastic radiation therapy: Secondary | ICD-10-CM | POA: Insufficient documentation

## 2023-09-30 DIAGNOSIS — C7951 Secondary malignant neoplasm of bone: Secondary | ICD-10-CM | POA: Insufficient documentation

## 2023-09-30 DIAGNOSIS — I82421 Acute embolism and thrombosis of right iliac vein: Secondary | ICD-10-CM

## 2023-09-30 DIAGNOSIS — G8193 Hemiplegia, unspecified affecting right nondominant side: Secondary | ICD-10-CM

## 2023-09-30 DIAGNOSIS — C3490 Malignant neoplasm of unspecified part of unspecified bronchus or lung: Secondary | ICD-10-CM | POA: Diagnosis not present

## 2023-09-30 DIAGNOSIS — C3491 Malignant neoplasm of unspecified part of right bronchus or lung: Secondary | ICD-10-CM | POA: Insufficient documentation

## 2023-09-30 DIAGNOSIS — C7931 Secondary malignant neoplasm of brain: Secondary | ICD-10-CM | POA: Diagnosis not present

## 2023-09-30 LAB — BODY FLUID CELL COUNT WITH DIFFERENTIAL
Eos, Fluid: 4 %
Lymphs, Fluid: 77 %
Monocyte-Macrophage-Serous Fluid: 17 % — ABNORMAL LOW (ref 50–90)
Neutrophil Count, Fluid: 0 % (ref 0–25)
Other Cells, Fluid: 2 %
Total Nucleated Cell Count, Fluid: 185 uL (ref 0–1000)

## 2023-09-30 LAB — CBC WITH DIFFERENTIAL/PLATELET
Abs Immature Granulocytes: 0.05 K/uL (ref 0.00–0.07)
Basophils Absolute: 0 K/uL (ref 0.0–0.1)
Basophils Relative: 0 %
Eosinophils Absolute: 0 K/uL (ref 0.0–0.5)
Eosinophils Relative: 0 %
HCT: 31.8 % — ABNORMAL LOW (ref 36.0–46.0)
Hemoglobin: 10.6 g/dL — ABNORMAL LOW (ref 12.0–15.0)
Immature Granulocytes: 1 %
Lymphocytes Relative: 9 %
Lymphs Abs: 0.7 K/uL (ref 0.7–4.0)
MCH: 30.1 pg (ref 26.0–34.0)
MCHC: 33.3 g/dL (ref 30.0–36.0)
MCV: 90.3 fL (ref 80.0–100.0)
Monocytes Absolute: 0.2 K/uL (ref 0.1–1.0)
Monocytes Relative: 3 %
Neutro Abs: 6.3 K/uL (ref 1.7–7.7)
Neutrophils Relative %: 87 %
Platelets: 269 K/uL (ref 150–400)
RBC: 3.52 MIL/uL — ABNORMAL LOW (ref 3.87–5.11)
RDW: 15.7 % — ABNORMAL HIGH (ref 11.5–15.5)
WBC: 7.2 K/uL (ref 4.0–10.5)
nRBC: 0 % (ref 0.0–0.2)

## 2023-09-30 LAB — BASIC METABOLIC PANEL WITH GFR
Anion gap: 13 (ref 5–15)
BUN: 23 mg/dL — ABNORMAL HIGH (ref 6–20)
CO2: 24 mmol/L (ref 22–32)
Calcium: 8.3 mg/dL — ABNORMAL LOW (ref 8.9–10.3)
Chloride: 98 mmol/L (ref 98–111)
Creatinine, Ser: 1.35 mg/dL — ABNORMAL HIGH (ref 0.44–1.00)
GFR, Estimated: 45 mL/min — ABNORMAL LOW (ref >60)
Glucose, Bld: 200 mg/dL — ABNORMAL HIGH (ref 70–99)
Potassium: 4.3 mmol/L (ref 3.5–5.1)
Sodium: 135 mmol/L (ref 135–145)

## 2023-09-30 LAB — GLUCOSE, CAPILLARY
Glucose-Capillary: 186 mg/dL — ABNORMAL HIGH (ref 70–99)
Glucose-Capillary: 175 mg/dL — ABNORMAL HIGH (ref 70–99)
Glucose-Capillary: 188 mg/dL — ABNORMAL HIGH (ref 70–99)

## 2023-09-30 LAB — PROTEIN, PLEURAL OR PERITONEAL FLUID

## 2023-09-30 LAB — LACTATE DEHYDROGENASE, PLEURAL OR PERITONEAL FLUID: LD, Fluid: 191 U/L — ABNORMAL HIGH (ref 3–23)

## 2023-09-30 MED ORDER — DEXAMETHASONE 4 MG PO TABS
8.0000 mg | ORAL_TABLET | Freq: Two times a day (BID) | ORAL | 0 refills | Status: DC
  Filled 2023-09-30: qty 30, 8d supply, fill #0

## 2023-09-30 MED ORDER — POLYVINYL ALCOHOL 1.4 % OP SOLN: 1.0000 [drp] | OPHTHALMIC | Status: AC | PRN

## 2023-09-30 MED ORDER — ENSURE PLUS HIGH PROTEIN PO LIQD: 237.0000 mL | Freq: Two times a day (BID) | ORAL | Status: DC

## 2023-09-30 MED ORDER — HEPARIN SOD (PORK) LOCK FLUSH 100 UNIT/ML IV SOLN
500.0000 [IU] | INTRAVENOUS | Status: AC | PRN
  Administered 2023-09-30: 500 [IU]

## 2023-09-30 MED ORDER — ACETAMINOPHEN 500 MG PO TABS: 500.0000 mg | ORAL_TABLET | Freq: Four times a day (QID) | ORAL | Status: AC | PRN

## 2023-09-30 MED ORDER — INSULIN GLARGINE 100 UNIT/ML SOLOSTAR PEN
10.0000 [IU] | PEN_INJECTOR | SUBCUTANEOUS | 11 refills | Status: DC
  Filled 2023-09-30: qty 3, 28d supply, fill #0

## 2023-09-30 MED ORDER — APIXABAN 5 MG PO TABS
ORAL_TABLET | ORAL | 0 refills | Status: DC
  Filled 2023-09-30: qty 60, 25d supply, fill #0

## 2023-09-30 NOTE — Plan of Care (Signed)

## 2023-09-30 NOTE — TOC CM/SW Note (Signed)
 Transition of Care [facility]) - Inpatient Brief Assessment   Patient Details  Name: Cassidy [name] MRN: [id number] Date of Birth: 1965/08/21  Transition of Care [facility] CM/SW Contact:    Lauraine FORBES Saa, LCSWA Phone Number: 09/30/2023, 4:21 PM   Clinical Narrative:  4:21 PM Per chart review, patient has discharge orders. Patient is anticipated to discharge today. CSW provided ALF placement resources. TOC will continue to follow and be available to assist.  Transition of Care Asessment: Insurance and Status: Insurance coverage has been reviewed Patient has primary care physician: Yes Home environment has been reviewed: Private Residence Prior level of function:: Independent/Modified Audiological scientist Home Services: Current home services [facility]) Social Drivers of Health Review: SDOH reviewed no interventions necessary Readmission risk has been reviewed: Yes (Currently Red 41%) Transition of care needs: transition of care needs identified, TOC will continue to follow

## 2023-09-30 NOTE — Assessment & Plan Note (Signed)
 Vital signs stable overnight, currently on hospice care. Strength symmetric bilaterally today. CBC stable, BMP creatinine up slightly 1.01 > 1.21 but not criteria for AKI. MRI brain without contrast with new 5 cm left parietal mass with prominent edema consistent with metastasis. Has lost 60 lbs in last year or so. - EEG consistent with mass in right parietal region, no epileptiform waves - Continue 8 mg Dexamethasone  TID as per radiation oncology recs - Pain regimen: Tylenol  500 mg every 6 hours - Continuing home pain regimen of morphine , oxycodone , fentanyl  patch increased to 25 mcg, gabapentin , duloxetine , hyoscyamine , and Voltaren  - Oncology consulted, appreciate recommendations - Radiation oncology consulted, plan for simulation and mask fit today at [facility] outpatient radiation treatment 5 times SRS fractionation next week - Palliative consulted, appreciate recommendations - AM labs: CMP, CBC

## 2023-09-30 NOTE — Assessment & Plan Note (Signed)
 Known malignant ascites.  Patient with new dark color to ascites, with no change or worsening of symptoms.  There is significant confusion regarding the delayed lab order and sample collection. - Continue home torsemide  40 mg daily - Due paracentesis labs outpatient

## 2023-09-30 NOTE — Assessment & Plan Note (Signed)
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 Strength symmetric bilaterally today. CBC stable, BMP creatinine up slightly 1.01 > 1.21 but not criteria for AKI. MRI brain without contrast with new 5 cm left parietal mass with prominent edema consistent with metastasis. Has lost 60 lbs in last year or so. - EEG consistent with mass in right parietal region, no epileptiform waves - Continue 8 mg Dexamethasone  TID as per radiation oncology recs - Pain regimen: Tylenol  500 mg every 6 hours - Continuing home pain regimen of morphine , oxycodone , fentanyl  patch increased to 25 mcg, gabapentin , duloxetine , hyoscyamine , and Voltaren  - Oncology consulted, appreciate recommendations - Radiation oncology consulted, plan for simulation and mask fit today at [facility] outpatient radiation treatment 5 times SRS fractionation next week - Palliative consulted, appreciate recommendations - AM labs: CMP, CBC "

## 2023-09-30 NOTE — Discharge Summary (Signed)
 Family Medicine Teaching [facility] Discharge Summary  Patient name: Cassidy [name] Medical record number: [id number] Date of birth: August 13, 1965 Age: 58 y.o. Gender: female Date of Admission: 09/28/2023  Date of Discharge: 09/30/2023 Admitting Physician: Krystal JONETTA McDiarmid, MD  Primary Care Provider: Lorrane Pac, MD Consultants: Radiation oncology, oncology, palliative, neurosurgery  Indication for Hospitalization: Right-sided weakness  Discharge Diagnoses/Problem List:  Principal Problem for Admission: New metastatic brain lesion Other Problems addressed during stay:  Active Problems:   Adenocarcinoma of right lung, stage 4 (HCC)   Port-A-Cath in place   Hypokalemia   Ascites   Right sided weakness   Right leg DVT (HCC)   T2DM (type 2 diabetes mellitus) (HCC)   Weight loss, abnormal   Metastatic cancer [facility])  Hospital course as below: Cassidy [name] is a 54 y.o.female with a history of stage IV adenocarcinoma of lung s/p chemo/radiation, CAD,tobacco use, T2DM, GERD, obstructive chronic bronchitis, anasarca, HFpEF with recent diagnosis of DVT who was admitted to the [facility] Medicine Teaching Service at [facility] for new metastatic cancer to brain. 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  MRI brain without contrast on admission found new 5 cm left parietal mass with prominent edema consistent with metastasis.  Started on dexamethasone  and neurologic symptoms resolved.  Palliative and radiation oncology were consulted and recommended performing stimulation and mask fit 9/19 at [facility]. She returned to [facility] and was discharged to start [facility] radiotherapy outpatient next week.  Ascites History of malignant ascites with PleurX catheter system in place.  Reported a change in output to dark brown and smelly liquid.  Attempted to collect peritoneal fluid analyses,  however they were unable to be collected and run.  Consider performing peritoneal fluid analyses as needed.  Right leg DVT DVT likely in the setting of Stage IV adenocarcinoma w/ brain mets.  Previously admitted to ED 9/15 and started on loading dose 10 mg Eliquis .  Loading dose was continued during hospitalization, edema gradually improved during hospitalization and patient was asymptomatic.  Continue Eliquis  10 mg BID till 9/21 and then transition to 5 mg BID daily thereafter.  Other chronic conditions were medically managed with home medications and formulary alternatives as necessary (T2DM, HTN, HLD, GERD, COPD, depression/anxiety)  PCP Follow-up Recommendations: Assist with outpatient radiation therapy next week and hospice planning. Adjust medications as needed symptomatically. Consider repeating peritoneal fluid analyses given lab confusion while at [facility] given the change in fluid appearance per patient Ensure transition to 5 mg BID dosing of eliquis  after 9/21 Consider further decrease of dexamethasone  8 mg BID to daily (changed from TID to BID at discharge after 3 days) Assess glucose control on 10 units of lantus  (on 24 units outpatient though sugars stable on 10 inpatient)   Disposition: Home  Discharge Condition: Stable  Discharge Exam:  Vitals:   09/30/23 0852 09/30/23 1353  BP:  119/68  Pulse: 73 70  Resp: 18   Temp:  98.1 F (36.7 C)  SpO2: 100% 100%   General: Sitting at bedside eating breakfast Cardiac: Regular rate and rhythm. Normal S1/S2. No murmurs, rubs, or gallops appreciated. Lungs: Clear bilaterally to ascultation.  Abdomen: Normoactive bowel sounds. No tenderness to deep or light palpation. No rebound or guarding.   Extremities: Bilateral 2+ pitting edema to below knees, right leg more edematous than left with chronic sclerotic changes of distal legs Neuro: Ambulating  well, 5 out of 5 strength bilaterally upper and lower extremities, 3 out of 5  strength after repetitive sustained exercise of right lower extremity, right hand tremor about 60 bpm Psych: Pleasant and appropriate    Significant Procedures: none  Significant Labs and Imaging:  Recent Labs  Lab 09/29/23 0600 09/30/23 0401  WBC 5.8 7.2  HGB 11.3* 10.6*  HCT 34.4* 31.8*  PLT 340 269   Recent Labs  Lab 09/29/23 0600 09/30/23 0401  NA 134*  134* 135  K 4.3  4.3 4.3  CL 97*  97* 98  CO2 21*  24 24  GLUCOSE 157*  160* 200*  BUN 16  16 23*  CREATININE 1.22*  1.23* 1.35*  CALCIUM  8.3*  8.2* 8.3*  ALKPHOS 65  67  --   AST 18  17  --   ALT 9  9  --   ALBUMIN  2.8*  2.8*  --     Brain MRI wo Contrast IMPRESSION: New 5 cm left parietal mass with prominent edema most consistent with a metastasis. Postcontrast imaging is recommended for complete staging.  MR Brain w wo contrast 3 tesla IMPRESSION: 1. 5.0 cm enhancing mass in the left parietal lobe with surrounding vasogenic edema, most concerning for metastatic disease. 2. Local mass effect and partial effacement of the left lateral ventricle. No midline shift. 3. Mild dural enhancement overlying the mass. 4. Mass abuts the superior sagittal sinus with evidence of invasion. 5. No additional intracranial lesions noted.  Discharge Medications:  Allergies as of 09/30/2023       Reactions   Amoxil  [amoxicillin ] Hives   Chantix [varenicline] Other (See Comments)   Nightmares Hallucinations     Suboxone [buprenorphine Hcl-naloxone Hcl] Nausea And Vomiting   Emend [fosaprepitant  Dimeglumine] Itching   See notes for transfusion on 8/23. Pt reported new onset of itching after Emend infusion was started. 25mg  Benadryl  IV given per MD. Pt tolerated remaining infusion well.         Medication List     STOP taking these medications    Apixaban  Starter Pack (10mg  and 5mg ) Commonly known as: ELIQUIS  STARTER PACK Replaced by: apixaban  5 MG Tabs tablet       TAKE these medications     Accu-Chek Aviva Plus test strip Generic drug: glucose blood USE AS INSTRUCTED EVERY DAY   acetaminophen  500 MG tablet Commonly known as: TYLENOL  Take 1 tablet (500 mg total) by mouth every 6 (six) hours as needed.   apixaban  5 MG Tabs tablet Commonly known as: ELIQUIS  Take 2 tablets (10 mg total) by mouth 2 (two) times daily for 5 days, THEN 1 tablet (5 mg total) 2 (two) times daily. Start taking on: September 30, 2023 Replaces: Apixaban  Starter Pack (10mg  and 5mg )   artificial tears ophthalmic solution Place 1 drop into both eyes as needed for dry eyes.   atorvastatin  40 MG tablet Commonly known as: LIPITOR TAKE 1 TABLET (40 MG TOTAL) BY MOUTH DAILY.   busPIRone  7.5 MG tablet Commonly known as: BUSPAR  Take 7.5 mg by mouth 2 (two) times daily.   carvedilol  6.25 MG tablet Commonly known as: COREG  Take 2 tablets (12.5 mg total) by mouth 2 (two) times daily with a meal.   dexamethasone  4 MG tablet Commonly known as: DECADRON  Take 2 tablets (8 mg total) by mouth 2 (two) times daily.   diclofenac  Sodium 1 % Gel Commonly known as: VOLTAREN  Apply 1 application topically 3 times daily as needed for pain  docusate sodium  100 MG capsule Commonly known as: COLACE Take 1 capsule (100 mg total) by mouth daily.   Dulera  100-5 MCG/ACT Aero Generic drug: mometasone -formoterol  Inhale 2 puffs into the lungs in the morning and at bedtime.   DULoxetine  30 MG capsule Commonly known as: Cymbalta  Take 1 capsule (30 mg total) by mouth daily.   feeding supplement Liqd Take 237 mLs by mouth 2 (two) times daily between meals. Start taking on: October 01, 2023   fentaNYL  25 MCG/HR Commonly known as: DURAGESIC  Place 1 patch onto the skin every 3 (three) days.   ferrous sulfate  325 (65 FE) MG tablet Take 1 tablet (325 mg total) by mouth daily with breakfast.   fluticasone  50 MCG/ACT nasal spray Commonly known as: FLONASE  PLACE 1 SPRAY INTO BOTH NOSTRILS DAILY AS NEEDED  (CONGESTION).   folic acid  1 MG tablet Commonly known as: FOLVITE  TAKE 1 TABLET (1 MG TOTAL) BY MOUTH DAILY.   gabapentin  300 MG capsule Commonly known as: NEURONTIN  Take 2 capsules (600 mg total) by mouth 3 (three) times daily.   hyoscyamine  0.125 MG SL tablet Commonly known as: LEVSIN  SL Place 1 tablet (0.125 mg total) under the tongue every 6 (six) hours as needed. For abdominal cramping   insulin  glargine 100 UNIT/ML Solostar Pen Commonly known as: LANTUS  Inject 10 Units into the skin every morning. What changed: how much to take   Insulin  Pen Needle 32G X 4 MM Misc 1 Needle by Does not apply route in the morning and at bedtime.   ipratropium 17 MCG/ACT inhaler Commonly known as: ATROVENT  HFA Inhale 2 puffs into the lungs every 4 (four) hours as needed for wheezing (COPD).   lactulose  10 GM/15ML solution Commonly known as: CHRONULAC  Take 15 mLs (10 g total) by mouth daily as needed for moderate constipation or severe constipation.   lidocaine -prilocaine  cream Commonly known as: EMLA  APPLY 1 APPLICATION TOPICALLY AS NEEDED. What changed:  when to take this reasons to take this   LORazepam  0.5 MG tablet Commonly known as: ATIVAN  Take 1 tablet (0.5 mg total) by mouth every 8 (eight) hours as needed for anxiety (nausea).   LUBRICATING EYE DROPS OP Place 1 drop into both eyes daily as needed (dry eyes).   metoCLOPramide  10 MG tablet Commonly known as: REGLAN  Take 1 tablet (10 mg total) by mouth every 8 (eight) hours as needed for nausea.   morphine  CONCENTRATE 10 mg / 0.5 ml concentrated solution Take 0.25 mLs by mouth every 6 (six) hours as needed for moderate pain (pain score 4-6).   nystatin  100000 UNIT/ML suspension Commonly known as: MYCOSTATIN  Take 5 mLs (500,000 Units total) by mouth 4 (four) times daily.   ondansetron  8 MG disintegrating tablet Commonly known as: ZOFRAN -ODT Take 1 tablet (8 mg total) by mouth every 8 (eight) hours as needed for nausea  or vomiting.   oxyCODONE  15 MG immediate release tablet Commonly known as: ROXICODONE  Take 1-2 tablets (15-30 mg total) by mouth every 4 (four) hours as needed for pain.   pantoprazole  40 MG tablet Commonly known as: PROTONIX  Take 1 tablet (40 mg total) by mouth 2 (two) times daily.   polyethylene glycol powder 17 GM/SCOOP powder Commonly known as: GaviLAX Take 17 g by mouth daily.   torsemide  20 MG tablet Commonly known as: DEMADEX  Take 2 tablets (40 mg total) by mouth daily.   valACYclovir  1000 MG tablet Commonly known as: VALTREX  Take for 3 days prn each outbreak.  Discharge Instructions: Please refer to Patient Instructions section of EMR for full details.  Patient was counseled important signs and symptoms that should prompt return to medical care, changes in medications, dietary instructions, activity restrictions, and follow up appointments.   Follow-Up Appointments:  Follow-up Information     Baker, Raguel MATSU, DO. Go on 10/06/2023.   Specialty: Family Medicine Why: at 1:45 PM for hospital follow up Contact information: [address] [place] [address] [phone]                 Lorrane Pac, MD 09/30/2023, 4:21 PM PGY-1, [facility] Family Medicine   I agree with the assessment and plan as documented above.  Stuart Redo, MD PGY-3, [facility] Family Medicine

## 2023-09-30 NOTE — Progress Notes (Signed)
 Daily Progress Note Intern Pager: [phone]  Patient name: Cassidy [name] Medical record number: [id number] Date of birth: [birth year] Age: 58 y.o. Gender: female  Primary Care Provider: Lorrane Pac, MD Consultants: Oncology, radiation oncology, palliative Code Status: Nurse-I  Pt Overview and Major Events to Date:  9/17-admitted  Medical Decision Making:  This is a 58 year old female with past medical history of stage IV adenocarcinoma lung s/p chemo/radiation, CAD, tobacco use, T2DM, GERD, obstructive chronic bronchitis, anasarca, HFpEF with recent diagnosis of DVT presenting with weakness and tremors found to have imaging consistent with new metastatic cancer with brain metastasis.  Assessment & Plan Right sided weakness Weight loss, abnormal Metastatic cancer (HCC) Vital signs stable overnight, currently on hospice care. Strength symmetric bilaterally today. CBC stable, BMP creatinine up slightly 1.01 > 1.21 but not criteria for AKI. MRI brain without contrast with new 5 cm left parietal mass with prominent edema consistent with metastasis. Has lost 60 lbs in last year or so. - EEG consistent with mass in right parietal region, no epileptiform waves - Continue 8 mg Dexamethasone  TID as per radiation oncology recs - Pain regimen: Tylenol  500 mg every 6 hours - Continuing home pain regimen of morphine , oxycodone , fentanyl  patch increased to 25 mcg, gabapentin , duloxetine , hyoscyamine , and Voltaren  - Oncology consulted, appreciate recommendations - Radiation oncology consulted, plan for simulation and mask fit today at [facility] outpatient radiation treatment 5 times SRS fractionation next week - Palliative consulted, appreciate recommendations - AM labs: CMP, CBC Adenocarcinoma of right lung, stage 4 [facility]) Receiving hospice/palliative care at [facility]. Ascites Known malignant ascites.  Patient with new dark color to ascites, with no change or worsening  of symptoms.  There is significant confusion regarding the delayed lab order and sample collection. - Continue home torsemide  40 mg daily - Due paracentesis labs outpatient T2DM (type 2 diabetes mellitus) (HCC) Blood glucose ranging 225-186, received 14 units aspart in last 24 hours, fasting 200 this morning - Continue moderate SSI - Increase to 10 units Lantus  daily given controlled CBGs Chronic health problem HTN: Continue home carvedilol  12.5 mg twice daily HLD: Continue home Lipitor 40 mg GERD: Continue home pantoprazole  40 mg twice daily COPD: Continue home Breo Ellipta  1 puff daily, ipratropium 0.5 mg every 4 hours as needed Depression/Anxiety: Continue home Buspar  7.5 3 times daily Hypokalemia (Resolved: 09/30/2023) AM BMP K 4.3 much improved from K of 3.3 on admission, corrected with 40 mill equivalents K. - Will continue to follow   FEN/GI: Regular PPx: Eliquis  Dispo:Home with home health today.   Subjective:  Saw patient at bedside was eating breakfast.  Reports that she is ready to go and is feeling much better.  Understood the plan and was confused that her discharge was this morning at 10 AM.  Feels that she is back to her normal self, no fevers, nausea, vomiting, pain is well-controlled.  Objective: Temp:  [98 F (36.7 C)-98.4 F (36.9 C)] 98.4 F (36.9 C) (09/19 0800) Pulse Rate:  [72-83] 73 (09/19 0852) Resp:  [17-18] 18 (09/19 0852) BP: (103-130)/(70-84) 118/78 (09/19 0800) SpO2:  [100 %] 100 % (09/19 0852) Physical Exam: General: Sitting at bedside eating breakfast Cardiac: Regular rate and rhythm. Normal S1/S2. No murmurs, rubs, or gallops appreciated. Lungs: Clear bilaterally to ascultation.  Abdomen: Normoactive bowel sounds. No tenderness to deep or light palpation. No rebound or guarding.   Extremities: Bilateral 2+ pitting edema to below knees, right leg more edematous than left  with chronic sclerotic changes of distal legs Neuro: Ambulating well, 5 out  of 5 strength bilaterally upper and lower extremities, 3 out of 5 strength after repetitive sustained exercise of right lower extremity, right hand tremor about 60 bpm Psych: Pleasant and appropriate   Laboratory: Most recent CBC Lab Results  Component Value Date   WBC 7.2 09/30/2023   HGB 10.6 (L) 09/30/2023   HCT 31.8 (L) 09/30/2023   MCV 90.3 09/30/2023   PLT 269 09/30/2023   Most recent BMP    Latest Ref Rng & Units 09/30/2023    4:01 AM  BMP  Glucose 70 - 99 mg/dL 799   BUN 6 - 20 mg/dL 23   Creatinine 9.55 - 1.00 mg/dL 8.64   Sodium 864 - 854 mmol/L 135   Potassium 3.5 - 5.1 mmol/L 4.3   Chloride 98 - 111 mmol/L 98   CO2 22 - 32 mmol/L 24   Calcium  8.9 - 10.3 mg/dL 8.3     Other pertinent labs none  Imaging/Diagnostic Tests: MRI brain W. Wo contrast IMPRESSION: 1. 5.0 cm enhancing mass in the left parietal lobe with surrounding vasogenic edema, most concerning for metastatic disease. 2. Local mass effect and partial effacement of the left lateral ventricle. No midline shift. 3. Mild dural enhancement overlying the mass. 4. Mass abuts the superior sagittal sinus with evidence of invasion. 5. No additional intracranial lesions noted.  Lorrane Pac, MD 09/30/2023, 11:32 AM  PGY-1, [facility] Family Medicine FPTS Intern pager: [phone], text pages welcome Secure chat group [facility] Teaching Service

## 2023-09-30 NOTE — Assessment & Plan Note (Signed)
 Blood glucose ranging 225-186, received 14 units aspart in last 24 hours, fasting 200 this morning - Continue moderate SSI - Increase to 10 units Lantus  daily given controlled CBGs

## 2023-09-30 NOTE — Progress Notes (Signed)
 Patient discharged, walker, belongings with patient. Patient off floor in wheel chair with family. Medication from pharmacy picked up.  Chest Port discontinued by IV team. Education completed, medications and follow up appointments dicussed, new medication and changed discussed. Patient alert/orient. No distress noted at this time.

## 2023-09-30 NOTE — Progress Notes (Signed)
 Daily Progress Note   Patient Name: Cassidy [name]       Date: 09/30/2023 DOB: 06-20-1965  Age: 58 y.o. MRN#: [id number] Attending Physician: McDiarmid, Krystal JONETTA, MD Primary Care Physician: Lorrane Pac, MD Admit Date: 09/28/2023  Reason for Consultation/Follow-up: Establishing goals of care  Subjective: No complaints today, a little nervous about starting radiation but feels comfortable with decision  Length of Stay: 1  Current Medications: Scheduled Meds:   acetaminophen   500 mg Oral Q6H   apixaban   10 mg Oral BID   Followed by   NOREEN ON 10/05/2023] apixaban   5 mg Oral BID   atorvastatin   40 mg Oral Daily   busPIRone   7.5 mg Oral BID   carvedilol   12.5 mg Oral BID WC   Chlorhexidine  Gluconate Cloth  6 each Topical Daily   dexamethasone   8 mg Oral Q8H   diclofenac  Sodium  4 g Topical QID   docusate sodium   100 mg Oral Daily   DULoxetine   30 mg Oral Daily   feeding supplement  237 mL Oral BID BM   fentaNYL   1 patch Transdermal Q72H   ferrous sulfate   325 mg Oral Q breakfast   fluticasone  furoate-vilanterol  1 puff Inhalation Daily   folic acid   1 mg Oral Daily   gabapentin   600 mg Oral TID   insulin  aspart  0-15 Units Subcutaneous TID WC   insulin  glargine  5 Units Subcutaneous Daily   nystatin   5 mL Oral QID   pantoprazole   40 mg Oral BID   polyethylene glycol  17 g Oral Daily   torsemide   40 mg Oral Daily    Continuous Infusions:   PRN Meds: artificial tears, fluticasone , hyoscyamine , ipratropium, lactulose , lidocaine -prilocaine , LORazepam , metoCLOPramide , morphine  CONCENTRATE, ondansetron , oxyCODONE , sodium chloride  flush  Physical Exam Constitutional:      General: She is not in acute distress.    Appearance: She is ill-appearing.  Pulmonary:     Effort: Pulmonary  effort is normal.  Skin:    General: Skin is warm and dry.  Neurological:     Mental Status: She is alert and oriented to person, place, and time.  Psychiatric:        Mood and Affect: Mood normal.        Behavior: Behavior normal.             Vital Signs: BP 118/78 (BP Location: Right Arm)   Pulse 73   Temp 98.4 F (36.9 C) (Oral)   Resp 18   Ht 5' 7 (1.702 m)   Wt 72.6 kg   LMP 06/12/2016 (Approximate)   SpO2 100%   BMI 25.06 kg/m  SpO2: SpO2: 100 % O2 Device: O2 Device: Room Air O2 Flow Rate:    Intake/output summary: No intake or output data in the 24 hours ending 09/30/23 1215 LBM: Last BM Date : 09/29/23 Baseline Weight: Weight: 72.6 kg Most recent weight: Weight: 72.6 kg       Palliative Assessment/Data: PPS 60%      Patient Active Problem List   Diagnosis Date Noted   Weight loss, abnormal 09/29/2023   Metastatic cancer (HCC) 09/29/2023   Metastasis to brain (HCC) 09/29/2023  Right sided weakness 09/28/2023   Right leg DVT (HCC) 09/28/2023   T2DM (type 2 diabetes mellitus) (HCC) 09/28/2023   Chronic back pain 04/10/2023   Chronic health problem 04/09/2023   Ascites 01/24/2023   Abdominal distension 12/28/2022   Chest pain 09/29/2022   Nausea and vomiting 09/29/2022   Pleural effusion 09/29/2022   Hypomagnesemia 09/29/2022   Demand ischemia of myocardium (HCC) 09/29/2022   Anasarca 08/25/2022   Chronic idiopathic constipation 06/25/2022   Depression 03/10/2022   Port-A-Cath in place 02/17/2022   Need for influenza vaccination 10/27/2021   Urinary frequency 09/07/2021   Encounter for antineoplastic chemotherapy 09/02/2021   Encounter for antineoplastic immunotherapy 09/02/2021   Adenocarcinoma of right lung, stage 4 (HCC) 08/26/2021   Lung mass 08/20/2021   S/P CABG (coronary artery bypass graft) 06/30/2021   Shortness of breath 06/03/2021   Depressed mood 05/15/2021   Abdominal pain 07/21/2020   Type 2 diabetes mellitus (HCC) 06/11/2020    Back pain 06/11/2020   Anxiety 05/22/2020   Angina pectoris (HCC) 05/22/2020   Blood clotting disorder (HCC) 06/21/2019   Atherosclerotic heart disease of native coronary artery with other forms of angina pectoris (HCC) 06/21/2019   Obstructive chronic bronchitis with exacerbation (HCC)    Hyperlipidemia with target LDL less than 70    Fibromyalgia    PTSD (post-traumatic stress disorder)    CAD S/P percutaneous coronary angioplasty 11/02/2013   Leiomyoma of uterus, unspecified 07/20/2012   Essential hypertension 05/26/2012   Tobacco abuse 05/26/2012   Unstable angina pectoris (HCC) 05/26/2012   CARPAL TUNNEL SYNDROME, LEFT 12/18/2009   TINEA VERSICOLOR 04/06/2007   Moderate depressed bipolar I disorder (HCC) 11/24/2006   HERPES, GENITAL NEC 09/30/2006   SUBSTANCE ABUSE 09/30/2006   GERD 09/30/2006   PLANTAR FASCIITIS 09/30/2006    Palliative Care Assessment & Plan   HPI: 58 y.o. female  with past medical history of stage IV adenocarcinoma lung s/p chemo/radiation, CAD, tobacco use, T2DM, GERD, obstructive chronic bronchitis, anasarca, HFpEF with recent diagnosis of DVT admitted on 09/28/2023 with weakness and tremors found to have imaging consistent with new metastatic cancer with brain mets. Last cancer treatment 2024. Patient currently receiving hosp[facility] care through [facility]. Was previously followed by outpatient palliative care at the [facility] cancer center. PMT consulted for GOC discussions.   Assessment: Follow up today with Cassidy [name]. Her son Cassidy [name] joins in conversation via phone. We review her conversation with radiation oncology yesterday. She has decided to proceed with radiation. She understands the intent is not curative but is hopeful for better symptom management - though she does feel she is feeling much better since she has been on steroids. We discuss importance of medication adherence. She shares her concerns about what side effects she may have from radiation.    Patient and son tell me about moving to [facility] and their relationship - how they support one another. Emotional support provided.   Multiple conversations throughout day with medical team, RN, and patient regarding transfer to [facility] for radiation. Eventually was decided she could dc today.  Followed up with her hospice liaison who confirmed they could continue to provide care for her while she receives radiation.   Recommendations/Plan: Will start radiation (limited treatments) Plans to remain under hospice care, very happy with their support Son agrees with plan and supports whatever his mom decides She plans to follow up with Levon, NP w/ palliative at cancer center  Code Status: DNR  Discharge Planning: Home with Hospice  Care  plan was discussed with patient, son, RN, family medicine team, rad onc, hospice liaison, palliative NP at cancer center  Thank you for allowing the Palliative Medicine Team to assist in the care of this patient.   Total Time 50 minutes Prolonged Time Billed  no   Time spent includes: Detailed review of medical records (labs, imaging, vital signs), medically appropriate exam, discussion with treatment team, counseling and educating patient, family and/or staff, documenting clinical information, medication management and coordination of care.     *Please note that this is a verbal dictation therefore any spelling or grammatical errors are due to the Dragon Medical One system interpretation.  Tobey Jama Barnacle, DNP, [facility] Palliative Medicine Team Team Phone # [phone]  Pager [phone]

## 2023-09-30 NOTE — Progress Notes (Signed)
 OT Cancellation Note  Patient Details Name: PRECIOUS SEGALL MRN: [id number] DOB: 1965/07/17   Cancelled Treatment:    Reason Eval/Treat Not Completed: Patient at procedure or test/ unavailable (Per RN, pt currently being transported to [facility] for oncology care and will return to [facility] later today. OT to reattempt to see pt for OT eval at a later time as appropriate/available.)  Margarie Rockey HERO., OTR/L, MA Acute Rehab [phone]   Margarie FORBES Horns 09/30/2023, 10:29 AM

## 2023-09-30 NOTE — Assessment & Plan Note (Signed)
 AM BMP K 4.3 much improved from K of 3.3 on admission, corrected with 40 mill equivalents K. - Will continue to follow

## 2023-09-30 NOTE — Progress Notes (Signed)
 Patient will leave at 10 AM by carerlink to [facility]

## 2023-09-30 NOTE — Assessment & Plan Note (Signed)
 HTN: Continue home carvedilol  12.5 mg twice daily HLD: Continue home Lipitor 40 mg GERD: Continue home pantoprazole  40 mg twice daily COPD: Continue home Breo Ellipta  1 puff daily, ipratropium 0.5 mg every 4 hours as needed Depression/Anxiety: Continue home Buspar  7.5 3 times daily

## 2023-09-30 NOTE — Progress Notes (Signed)
 AVS completed for discharge packet and placed with chart and primary nurse.

## 2023-09-30 NOTE — Assessment & Plan Note (Signed)
 Receiving hospice/palliative care at [facility].

## 2023-10-01 ENCOUNTER — Ambulatory Visit: Payer: Self-pay | Admitting: Family Medicine

## 2023-10-03 ENCOUNTER — Other Ambulatory Visit (HOSPITAL_COMMUNITY): Payer: Self-pay

## 2023-10-03 ENCOUNTER — Other Ambulatory Visit (HOSPITAL_BASED_OUTPATIENT_CLINIC_OR_DEPARTMENT_OTHER): Payer: Self-pay

## 2023-10-03 ENCOUNTER — Other Ambulatory Visit: Payer: Self-pay

## 2023-10-03 ENCOUNTER — Telehealth: Payer: Self-pay

## 2023-10-03 ENCOUNTER — Encounter: Payer: Self-pay | Admitting: Internal Medicine

## 2023-10-03 ENCOUNTER — Encounter: Payer: Self-pay | Admitting: Radiation Oncology

## 2023-10-03 LAB — PATHOLOGIST SMEAR REVIEW

## 2023-10-03 LAB — BODY FLUID CULTURE W GRAM STAIN: Culture: NO GROWTH — AB

## 2023-10-03 NOTE — Transitions of Care (Post Inpatient/ED Visit) (Signed)
   10/03/2023  Name: Cassidy [name] MRN: [id number] DOB: [birth year]  Today's TOC FU Call Status: Today's TOC FU Call Status:: Successful TOC FU Call Completed (Patient states she is in hospice care with [facility]) [facility] FU Call Complete Date: 10/03/23 Patient's Name and Date of Birth confirmed.  Transition Care Management Follow-up Telephone Call Date of Discharge: 09/30/23 Discharge Facility: [facility]) Type of Discharge: Inpatient Admission Primary Inpatient Discharge Diagnosis:: Primary malignant neoplasm of lung with metastasis to brain  No further TOC outreach indicated  [name] RN, CCM [facility]  VBCI-Population Health RN Care Manager [phone]

## 2023-10-04 DIAGNOSIS — C7951 Secondary malignant neoplasm of bone: Secondary | ICD-10-CM | POA: Diagnosis present

## 2023-10-04 DIAGNOSIS — Z510 Encounter for antineoplastic radiation therapy: Secondary | ICD-10-CM | POA: Diagnosis not present

## 2023-10-04 DIAGNOSIS — C3491 Malignant neoplasm of unspecified part of right bronchus or lung: Secondary | ICD-10-CM | POA: Diagnosis not present

## 2023-10-05 ENCOUNTER — Ambulatory Visit
Admission: RE | Admit: 2023-10-05 | Discharge: 2023-10-05 | Disposition: A | Discharge status: Home / Self Care | Source: Ambulatory Visit | Attending: Radiation Oncology | Admitting: Radiation Oncology

## 2023-10-05 ENCOUNTER — Other Ambulatory Visit: Payer: Self-pay

## 2023-10-05 DIAGNOSIS — C7931 Secondary malignant neoplasm of brain: Secondary | ICD-10-CM | POA: Diagnosis not present

## 2023-10-05 DIAGNOSIS — Z510 Encounter for antineoplastic radiation therapy: Secondary | ICD-10-CM | POA: Diagnosis not present

## 2023-10-05 LAB — RAD ONC ARIA SESSION SUMMARY
Course Elapsed Days: 0
Plan Fractions Treated to Date: 1
Plan Prescribed Dose Per Fraction: 5 Gy
Plan Total Fractions Prescribed: 5
Plan Total Prescribed Dose: 25 Gy
Reference Point Dosage Given to Date: 5 Gy
Reference Point Session Dosage Given: 5 Gy
Session Number: 1

## 2023-10-05 NOTE — Progress Notes (Signed)
 Mrs. Zavadil rested with us  for 15 minutes following her SRS treatment.  Patient denies headache, dizziness, nausea, diplopia or ringing in the ears. Denies fatigue. Patient without complaints. Understands to avoid strenuous activity for the next 24 hours and call [phone] with needs.   BP 114/71 (BP Location: Left Arm)   Pulse (!) 57   Temp (!) 97.1 F (36.2 C) (Temporal)   Resp 18   LMP 06/12/2016 (Approximate)   SpO2 100%

## 2023-10-05 NOTE — Progress Notes (Incomplete)
 Nurse monitoring complete status post *** of *** SRS treatments. Patient without complaints. Patient denies new or worsening neurologic symptoms. Vitals stable. Instructed patient to avoid strenuous activity for the next 24 hours. (***Instructed patient to not miss any of her decadron  doses***). Instructed patient to call [phone] with needs related to treatment after hours or over the weekend. Patient verbalized understanding   **pt name** rested with us  for 15 minutes following SRS treatment.  Patient denies headache, dizziness, nausea, diplopia or ringing in the ears. Denies fatigue. Patient without complaints. Understands to avoid strenuous activity for the next 24 hours and call [phone] with needs

## 2023-10-05 NOTE — Op Note (Signed)
 Name: Cassidy [name]  MRN: [id number]  Date: 10/05/2023   DOB: 01-28-1965  Stereotactic Radiosurgery Operative Note  PRE-OPERATIVE DIAGNOSIS:  Solitary Brain Metastasis  POST-OPERATIVE DIAGNOSIS:  Solitary Brain Metastasis  PROCEDURE:  Stereotactic Radiosurgery  SURGEON:  Dorn JONELLE Glade, MD  NARRATIVE: The patient underwent a radiation treatment planning session in the radiation oncology simulation suite under the care of the radiation oncology physician and physicist.  I participated closely in the radiation treatment planning afterwards. The patient underwent planning CT which was fused to 3T high resolution MRI with 1 mm axial slices.  These images were fused on the planning system.  We contoured the gross target volumes and subsequently expanded this to yield the Planning Target Volume. I actively participated in the planning process.  I helped to define and review the target contours and also the contours of the optic pathway, eyes, brainstem and selected nearby organs at risk.  All the dose constraints for critical structures were reviewed and compared to AAPM Task Group 101.  The prescription dose conformity was reviewed.  I approved the plan electronically.    Accordingly, Cassidy [name] was brought to the TrueBeam stereotactic radiation treatment linac and placed in the custom immobilization mask.  The patient was aligned according to the IR fiducial markers with BrainLab Exactrac, then orthogonal x-rays were used in ExacTrac with the 6DOF robotic table and the shifts were made to align the patient  Cassidy [name] received stereotactic radiosurgery uneventfully.    The detailed description of the procedure is recorded in the radiation oncology procedure note.  I was present for the duration of the procedure.  DISPOSITION:  Following delivery, the patient was transported to nursing in stable condition and monitored for possible acute effects to be discharged to home in stable  condition with follow-up in one month.  Dorn JONELLE Glade, MD 10/05/2023 1:14 PM

## 2023-10-06 ENCOUNTER — Other Ambulatory Visit: Payer: Self-pay

## 2023-10-06 ENCOUNTER — Ambulatory Visit (INDEPENDENT_AMBULATORY_CARE_PROVIDER_SITE_OTHER): Payer: Self-pay

## 2023-10-06 VITALS — BP 134/73 | HR 55 | Wt 161.6 lb

## 2023-10-06 DIAGNOSIS — Z09 Encounter for follow-up examination after completed treatment for conditions other than malignant neoplasm: Secondary | ICD-10-CM | POA: Diagnosis not present

## 2023-10-06 DIAGNOSIS — A6000 Herpesviral infection of urogenital system, unspecified: Secondary | ICD-10-CM

## 2023-10-06 DIAGNOSIS — C7931 Secondary malignant neoplasm of brain: Secondary | ICD-10-CM | POA: Diagnosis not present

## 2023-10-06 DIAGNOSIS — I82411 Acute embolism and thrombosis of right femoral vein: Secondary | ICD-10-CM | POA: Diagnosis not present

## 2023-10-06 DIAGNOSIS — C3491 Malignant neoplasm of unspecified part of right bronchus or lung: Secondary | ICD-10-CM

## 2023-10-06 DIAGNOSIS — I82421 Acute embolism and thrombosis of right iliac vein: Secondary | ICD-10-CM

## 2023-10-06 DIAGNOSIS — Z794 Long term (current) use of insulin: Secondary | ICD-10-CM | POA: Diagnosis not present

## 2023-10-06 DIAGNOSIS — E1165 Type 2 diabetes mellitus with hyperglycemia: Secondary | ICD-10-CM | POA: Diagnosis not present

## 2023-10-06 LAB — VITAMIN B1: Vitamin B1 (Thiamine): 72.8 nmol/L (ref 66.5–200.0)

## 2023-10-06 MED ORDER — INSULIN GLARGINE 100 UNIT/ML SOLOSTAR PEN: 8.0000 [IU] | PEN_INJECTOR | SUBCUTANEOUS | Status: DC

## 2023-10-06 MED ORDER — APIXABAN 5 MG PO TABS: 5.0000 mg | ORAL_TABLET | Freq: Every day | ORAL | 0 refills | Status: DC

## 2023-10-06 MED ORDER — LIDOCAINE-PRILOCAINE 2.5-2.5 % EX CREA: 1.0000 | TOPICAL_CREAM | Freq: Every day | CUTANEOUS | 2 refills | Status: DC | PRN

## 2023-10-06 MED ORDER — VALACYCLOVIR HCL 1 G PO TABS
ORAL_TABLET | ORAL | 11 refills | Status: DC
Start: 2023-10-06 — End: 2023-10-06

## 2023-10-06 MED ORDER — DEXAMETHASONE 4 MG PO TABS: 4.0000 mg | ORAL_TABLET | Freq: Every day | ORAL | Status: DC

## 2023-10-06 NOTE — Telephone Encounter (Signed)
 Received call from [facility] regarding prescriptions.   Provider is not enrolled in Medicaid.   Rx for decadron  and lantus  were not signed electronically. Prior to prescriptions being resent, dispense quantity and refills will need to be updated.   Forwarding to preceptor and Dr. Lennie.   Chiquita JAYSON English, RN

## 2023-10-06 NOTE — Progress Notes (Unsigned)
     SUBJECTIVE:   CHIEF COMPLAINT / HPI:   Hospital follow-up  Cassidy [name] presents today for hospital follow up.   Hospitalized at [facility] from 09/28/23 to 09/30/23, for right sided weakness found to be secondary to a new metastatic brain lesion.  Since discharge, patient reports that her symptoms of right sided weakness have remained improved with dexamethasone . Patient had her first radiation session yesterday. She states she has been feeling fatigued since yesterday. She was told at radiation treatment that it may be due to her medication. The patient does not know what medication it was that they mentioned. Patient also endorses seeing bright flashing lights, which she believes is due to the steroids.   Since leaving the hospital, the patient has been taking 10 units of Lantus  at home. She checks her sugars at home. Her sugars have been running in the low 100s (102).   Patient states the output from her PleurX catheter has continued to look the same since being in the hospital. The output is dark brown and smelly.   Patient is requesting refills for her Emla  cream and valtrex .    PERTINENT  PMH / PSH: Stage IV lung adenocarcinoma with brain metastasis, malignant ascites, right leg DVT, T2DM  OBJECTIVE:   BP 134/73   Pulse (!) 55   Wt 161 lb 9.6 oz (73.3 kg)   LMP 06/12/2016 (Approximate)   SpO2 95%   BMI 25.31 kg/m   General: NAD Cardiovascular: RRR, no m/r/g Respiratory: CTAB, normal work of breathing on room air  Abdomen: soft, non-distended, non-tender to palpation Extremities: 2+ pitting edema up to the knee bilaterally   ASSESSMENT/PLAN:   Assessment & Plan Hospital discharge follow-up Adenocarcinoma of right lung, stage 4 (HCC) Metastasis to brain [facility]) Patient started radiation treatment yesterday, feeling fatigued. History of malignant ascites with PleurX catheter.  - Emla  cream refilled - Dexamethasone  decreased to 4 mg daily  Type 2 diabetes  mellitus with hyperglycemia, with long-term current use of insulin  (HCC) Last A1c 5.0 1 week ago. Currently on 10 units Lantus . Glucose running in low 100s at home.  - Reduce Lantus  to 8 units - Follow-up in 1 month to assess glucose levels Acute deep vein thrombosis (DVT) of femoral vein of right lower extremity (HCC) Eliquis  5 mg sent to patient's preferred pharmacy.  Herpes simplex infection of genitourinary system Valtrex  refilled.      Raguel KANDICE Lee, DO [facility]

## 2023-10-06 NOTE — Patient Instructions (Addendum)
 Today we checked on you after your recent hospitalization. We went through your medication list. I decreased your Lantus  to 8 units today, as your sugars have been well controlled and your most recent A1c was 5.0. I also decreased your dexamethasone  to 4 mg daily.   Follow-up in 1 month so we can check in on your sugars.

## 2023-10-07 ENCOUNTER — Other Ambulatory Visit: Payer: Self-pay

## 2023-10-07 ENCOUNTER — Ambulatory Visit
Admission: RE | Admit: 2023-10-07 | Discharge: 2023-10-07 | Disposition: A | Discharge status: Home / Self Care | Payer: Self-pay | Source: Ambulatory Visit | Attending: Radiation Oncology | Admitting: Radiation Oncology

## 2023-10-07 DIAGNOSIS — Z510 Encounter for antineoplastic radiation therapy: Secondary | ICD-10-CM | POA: Diagnosis not present

## 2023-10-07 LAB — RAD ONC ARIA SESSION SUMMARY
Course Elapsed Days: 2
Plan Fractions Treated to Date: 2
Plan Prescribed Dose Per Fraction: 5 Gy
Plan Total Fractions Prescribed: 5
Plan Total Prescribed Dose: 25 Gy
Reference Point Dosage Given to Date: 10 Gy
Reference Point Session Dosage Given: 5 Gy
Session Number: 2

## 2023-10-07 MED ORDER — INSULIN GLARGINE 100 UNIT/ML SOLOSTAR PEN: 8.0000 [IU] | PEN_INJECTOR | SUBCUTANEOUS | Status: DC

## 2023-10-07 MED ORDER — DEXAMETHASONE 4 MG PO TABS: 4.0000 mg | ORAL_TABLET | Freq: Every day | ORAL | Status: DC

## 2023-10-07 MED ORDER — VALACYCLOVIR HCL 1 G PO TABS: ORAL_TABLET | ORAL | 11 refills | Status: DC

## 2023-10-07 MED ORDER — APIXABAN 5 MG PO TABS: 5.0000 mg | ORAL_TABLET | Freq: Every day | ORAL | 0 refills | Status: DC

## 2023-10-07 MED ORDER — LIDOCAINE-PRILOCAINE 2.5-2.5 % EX CREA: 1.0000 | TOPICAL_CREAM | Freq: Every day | CUTANEOUS | 2 refills | Status: DC | PRN

## 2023-10-07 NOTE — Assessment & Plan Note (Signed)
 Last A1c 5.0 1 week ago. Currently on 10 units Lantus . Glucose running in low 100s at home.  - Reduce Lantus  to 8 units - Follow-up in 1 month to assess glucose levels

## 2023-10-07 NOTE — Assessment & Plan Note (Signed)
 Patient started radiation treatment yesterday, feeling fatigued. History of malignant ascites with PleurX catheter.  - Emla  cream refilled - Dexamethasone  decreased to 4 mg daily

## 2023-10-07 NOTE — Progress Notes (Signed)
 Cassidy [name] rested with us  for 15 minutes following her SRS treatment.  Patient denies headache, dizziness, nausea, diplopia or ringing in the ears. Denies fatigue. Patient without complaints. Understands to avoid strenuous activity for the next 24 hours and call [phone] with needs.   BP 122/74 (BP Location: Left Arm)   Pulse 60   Temp (!) 96.8 F (36 C) (Temporal)   Resp 18   LMP 06/12/2016 (Approximate)   SpO2 99%

## 2023-10-07 NOTE — Assessment & Plan Note (Signed)
 Eliquis  5 mg sent to patient's preferred pharmacy.

## 2023-10-07 NOTE — Telephone Encounter (Signed)
 Per doctor seeing her, she is to use her home supply of decadron  and lantus  thus new script not sent in. Updated new dosing in chart.

## 2023-10-11 ENCOUNTER — Other Ambulatory Visit: Payer: Self-pay

## 2023-10-11 ENCOUNTER — Ambulatory Visit
Admission: RE | Admit: 2023-10-11 | Discharge: 2023-10-11 | Disposition: A | Discharge status: Home / Self Care | Payer: Self-pay | Source: Ambulatory Visit | Attending: Radiation Oncology | Admitting: Radiation Oncology

## 2023-10-11 DIAGNOSIS — Z510 Encounter for antineoplastic radiation therapy: Secondary | ICD-10-CM | POA: Diagnosis not present

## 2023-10-11 LAB — RAD ONC ARIA SESSION SUMMARY
Course Elapsed Days: 6
Plan Fractions Treated to Date: 3
Plan Prescribed Dose Per Fraction: 5 Gy
Plan Total Fractions Prescribed: 5
Plan Total Prescribed Dose: 25 Gy
Reference Point Dosage Given to Date: 15 Gy
Reference Point Session Dosage Given: 5 Gy
Session Number: 3

## 2023-10-11 NOTE — Progress Notes (Addendum)
 Patient rested with us  for 15 minutes following his SRS treatment.  Patient denies headache, dizziness, nausea, diplopia or ringing in the ears. Denies fatigue. Patient without complaints. Understands to avoid strenuous activity for the next 24 hours and call [phone] with needs.     Patient reports having a fall last night. Reports redness to right arm.   Denies hitting her head.  BP 111/66 (BP Location: Left Arm, Patient Position: Sitting, Cuff Size: Normal)   Pulse (!) 52   Temp (!) 96.8 F (36 C)   Resp 20   Ht 5' 7 (1.702 m)   LMP 06/12/2016 (Approximate)   SpO2 100%   BMI 25.31 kg/m

## 2023-10-13 ENCOUNTER — Other Ambulatory Visit: Payer: Self-pay

## 2023-10-13 ENCOUNTER — Ambulatory Visit
Admission: RE | Admit: 2023-10-13 | Discharge: 2023-10-13 | Disposition: A | Discharge status: Home / Self Care | Payer: Self-pay | Source: Ambulatory Visit | Attending: Radiation Oncology | Admitting: Radiation Oncology

## 2023-10-13 DIAGNOSIS — Z510 Encounter for antineoplastic radiation therapy: Secondary | ICD-10-CM | POA: Insufficient documentation

## 2023-10-13 DIAGNOSIS — C3491 Malignant neoplasm of unspecified part of right bronchus or lung: Secondary | ICD-10-CM | POA: Insufficient documentation

## 2023-10-13 DIAGNOSIS — C7951 Secondary malignant neoplasm of bone: Secondary | ICD-10-CM | POA: Insufficient documentation

## 2023-10-13 LAB — RAD ONC ARIA SESSION SUMMARY
Course Elapsed Days: 8
Plan Fractions Treated to Date: 4
Plan Prescribed Dose Per Fraction: 5 Gy
Plan Total Fractions Prescribed: 5
Plan Total Prescribed Dose: 25 Gy
Reference Point Dosage Given to Date: 20 Gy
Reference Point Session Dosage Given: 5 Gy
Session Number: 4

## 2023-10-13 NOTE — Progress Notes (Addendum)
 Ms. Dowler to nursing for 15 minutes for observation for [facility] Brain.  Ms. Menon reports slight fatigue, visual changes (blurry at times), mild headaches take Excedrin Migraine as needed.  Reports nausea no vomiting and does take Zofran  as needed.  Reports balance challenges and that she had fall x 2 days ago minor right hand bruising and denies hitting head.  Ms. Everhart takes Decadron  4 mg po daily reports ran out yesterday 10/12/2023 and that she is working to get her doctor to refill [facility]) is going to address.  Ms. Ayars was advised not to do anything strenuous for next 24 hours and to use assistive devices when mobile.  Vitals:  96.6-61-18-125/74 O2 Sat 100% RA.  Advised to call if need [phone].

## 2023-10-17 ENCOUNTER — Ambulatory Visit
Admission: RE | Admit: 2023-10-17 | Discharge: 2023-10-17 | Disposition: A | Payer: Self-pay | Source: Ambulatory Visit | Attending: Radiation Oncology | Admitting: Radiation Oncology

## 2023-10-17 ENCOUNTER — Encounter: Payer: Self-pay | Admitting: Radiation Oncology

## 2023-10-17 ENCOUNTER — Inpatient Hospital Stay: Attending: Internal Medicine

## 2023-10-17 ENCOUNTER — Other Ambulatory Visit: Payer: Self-pay | Admitting: Radiation Oncology

## 2023-10-17 ENCOUNTER — Other Ambulatory Visit: Payer: Self-pay

## 2023-10-17 DIAGNOSIS — Z510 Encounter for antineoplastic radiation therapy: Secondary | ICD-10-CM | POA: Diagnosis not present

## 2023-10-17 LAB — RAD ONC ARIA SESSION SUMMARY
Course Elapsed Days: 12
Plan Fractions Treated to Date: 5
Plan Prescribed Dose Per Fraction: 5 Gy
Plan Total Fractions Prescribed: 5
Plan Total Prescribed Dose: 25 Gy
Reference Point Dosage Given to Date: 25 Gy
Reference Point Session Dosage Given: 5 Gy
Session Number: 5

## 2023-10-17 MED ORDER — DEXAMETHASONE 4 MG PO TABS: 4.0000 mg | ORAL_TABLET | Freq: Two times a day (BID) | ORAL | 1 refills | Status: DC

## 2023-10-17 NOTE — Progress Notes (Addendum)
  Radiation Oncology         [phone] ________________________________  Name: Cassidy [name]  FMW:[id number]  Date of Service: 10/17/23  DOB: [birth year]   Steroid Taper Instructions   You currently have a prescription for Dexamethasone  4 mg Tablets.     Beginning 10/28/23: Take 1/2 of a tablet (which is 2 mg) twice a day  Beginning 11/04/23: Take 1/2 of a tablet (which is 2 mg) once a day  Beginning 11/11/23: Take 1/2 of a tablet (which is 2 mg) every other day and stop on 11/16/23.  Please call our office if you have any headaches, visual changes, uncontrolled movements, extremity weakness, nausea or vomiting.

## 2023-10-17 NOTE — Progress Notes (Signed)
 Cassidy [name] rested with us  for 15 minutes following her SRS treatment.  Patient denies headache, dizziness, nausea, diplopia or ringing in the ears. Denies fatigue. Patient without complaints. Understands to avoid strenuous activity for the next 24 hours and call [phone] with needs.   BP 99/64 (BP Location: Left Arm, Patient Position: Sitting, Cuff Size: Large)   Pulse 73   Temp (!) 97.4 F (36.3 C)   Resp 20   Ht 5' 7 (1.702 m)   LMP 06/12/2016 (Approximate)   SpO2 96%   BMI 25.31 kg/m

## 2023-10-19 ENCOUNTER — Telehealth: Payer: Self-pay | Admitting: Radiation Therapy

## 2023-10-19 NOTE — Telephone Encounter (Signed)
 I called pt to check in after the completion of her SRT radiation course. She is doing well, no complaints or problems. We reviewed her steroid taper instructions that should start on 10/17. I sent her an email while we were on the phone to limit her confusion with having to write out the instructions. She was able to read back the steroid taper instructions with me and expressed understanding. Ms. Foglesong has my contact information and was encouraged to call if she has any other questions or concerns.   Devere Perch R.T(R)(T) Radiation Special Procedures Lead

## 2023-10-21 NOTE — Radiation Completion Notes (Addendum)
  Radiation Oncology         [phone] ________________________________  Name: Cassidy [name] MRN: [id number]  Date of Service: 10/17/2023  DOB: April 22, 1965  End of Treatment Note    Diagnosis:  Progressive Stage IV, NSCLC, adenocarcinoma with K-ras mutation of the RUL with new brain disease   Intent: Palliative     ==========DELIVERED PLANS==========  First Treatment Date: 2023-10-05 Last Treatment Date: 2023-10-17   Plan Name: Brain_SRT Site: Brain [id number]  Technique: SBRT/SRT-IMRT Mode: Photon Dose Per Fraction: 5 Gy Prescribed Dose (Delivered / Prescribed): 25 Gy / 25 Gy Prescribed Fxs (Delivered / Prescribed): 5 / 5     ==========ON TREATMENT VISIT DATES========== 2023-10-05, 2023-10-07, 2023-10-11, 2023-10-13, 2023-10-17    See weekly On Treatment Notes in Epic for details in the Media tab (listed as Progress notes on the On Treatment Visit Dates listed above).  The patient tolerated radiation. She developed fatigue and was given a steroid taper at the conclusion of treatment.   The patient will receive a call in about one month from the radiation oncology department. She is going to continue her care with [facility], and I will reach out to her administrative team to let them know her treatment has completed.     Donald KYM Husband, PAC

## 2023-11-07 NOTE — Progress Notes (Signed)
  Radiation Oncology         [phone]) [phone] ________________________________  Name: Cassidy [name] MRN: [id number]  Date of Service: 11/21/2023  DOB: [birth year]  Post Treatment Telephone Note  Diagnosis:  Progressive Stage IV, NSCLC, adenocarcinoma with K-ras mutation of the RUL with new brain disease    First Treatment Date: 2023-10-05 Last Treatment Date: 2023-10-17   Plan Name: Brain_SRT Site: Brain [id number]  Technique: SBRT/SRT-IMRT Mode: Photon Dose Per Fraction: 5 Gy Prescribed Dose (Delivered / Prescribed): 25 Gy / 25 Gy Prescribed Fxs (Delivered / Prescribed): 5 / 5   The patient was not available for call today.  The patient is not scheduled for CT scans every 3-6 months for ongoing surveillance. She was encouraged to call if  she has not received a call to schedule CT imaging, or if  she develops concerns or questions regarding radiation.    Tried to call pt 11/18/23, no answer. Left message to call us  if she is having any problems.

## 2023-11-09 ENCOUNTER — Other Ambulatory Visit: Payer: Self-pay

## 2023-11-09 MED ORDER — INSULIN PEN NEEDLE 32G X 4 MM MISC: 1.0000 | Freq: Two times a day (BID) | 2 refills | Status: DC

## 2023-11-09 NOTE — Telephone Encounter (Signed)
 Chart reviewed  -Fairy Amy, MD

## 2023-11-21 ENCOUNTER — Ambulatory Visit
Admission: RE | Admit: 2023-11-21 | Discharge: 2023-11-21 | Disposition: A | Discharge status: Home / Self Care | Source: Ambulatory Visit | Attending: Internal Medicine | Admitting: Internal Medicine

## 2023-11-21 DIAGNOSIS — C3490 Malignant neoplasm of unspecified part of unspecified bronchus or lung: Secondary | ICD-10-CM

## 2023-11-28 ENCOUNTER — Inpatient Hospital Stay

## 2023-11-29 ENCOUNTER — Encounter (HOSPITAL_COMMUNITY): Payer: Self-pay

## 2023-11-29 ENCOUNTER — Emergency Department (HOSPITAL_COMMUNITY)

## 2023-11-29 ENCOUNTER — Emergency Department (HOSPITAL_COMMUNITY)
Admission: EM | Admit: 2023-11-29 | Discharge: 2023-11-29 | Disposition: A | Discharge status: Home / Self Care | Attending: Emergency Medicine | Admitting: Emergency Medicine

## 2023-11-29 ENCOUNTER — Other Ambulatory Visit: Payer: Self-pay

## 2023-11-29 DIAGNOSIS — M47812 Spondylosis without myelopathy or radiculopathy, cervical region: Secondary | ICD-10-CM | POA: Diagnosis not present

## 2023-11-29 DIAGNOSIS — E041 Nontoxic single thyroid nodule: Secondary | ICD-10-CM | POA: Diagnosis not present

## 2023-11-29 DIAGNOSIS — R0602 Shortness of breath: Secondary | ICD-10-CM | POA: Diagnosis not present

## 2023-11-29 DIAGNOSIS — R220 Localized swelling, mass and lump, head: Secondary | ICD-10-CM | POA: Diagnosis not present

## 2023-11-29 DIAGNOSIS — W19XXXA Unspecified fall, initial encounter: Secondary | ICD-10-CM | POA: Diagnosis not present

## 2023-11-29 DIAGNOSIS — R0789 Other chest pain: Secondary | ICD-10-CM | POA: Insufficient documentation

## 2023-11-29 DIAGNOSIS — S299XXA Unspecified injury of thorax, initial encounter: Secondary | ICD-10-CM | POA: Diagnosis not present

## 2023-11-29 DIAGNOSIS — G936 Cerebral edema: Secondary | ICD-10-CM | POA: Diagnosis not present

## 2023-11-29 DIAGNOSIS — R10A1 Flank pain, right side: Secondary | ICD-10-CM | POA: Insufficient documentation

## 2023-11-29 DIAGNOSIS — Z794 Long term (current) use of insulin: Secondary | ICD-10-CM | POA: Insufficient documentation

## 2023-11-29 DIAGNOSIS — Z951 Presence of aortocoronary bypass graft: Secondary | ICD-10-CM | POA: Diagnosis not present

## 2023-11-29 DIAGNOSIS — R0781 Pleurodynia: Secondary | ICD-10-CM | POA: Diagnosis not present

## 2023-11-29 DIAGNOSIS — S3991XA Unspecified injury of abdomen, initial encounter: Secondary | ICD-10-CM | POA: Diagnosis not present

## 2023-11-29 DIAGNOSIS — R918 Other nonspecific abnormal finding of lung field: Secondary | ICD-10-CM | POA: Diagnosis not present

## 2023-11-29 DIAGNOSIS — R9389 Abnormal findings on diagnostic imaging of other specified body structures: Secondary | ICD-10-CM | POA: Diagnosis not present

## 2023-11-29 DIAGNOSIS — S199XXA Unspecified injury of neck, initial encounter: Secondary | ICD-10-CM | POA: Diagnosis not present

## 2023-11-29 DIAGNOSIS — R29818 Other symptoms and signs involving the nervous system: Secondary | ICD-10-CM | POA: Diagnosis not present

## 2023-11-29 DIAGNOSIS — Z85118 Personal history of other malignant neoplasm of bronchus and lung: Secondary | ICD-10-CM | POA: Diagnosis not present

## 2023-11-29 DIAGNOSIS — S3992XA Unspecified injury of lower back, initial encounter: Secondary | ICD-10-CM | POA: Diagnosis not present

## 2023-11-29 DIAGNOSIS — R5383 Other fatigue: Secondary | ICD-10-CM | POA: Diagnosis not present

## 2023-11-29 DIAGNOSIS — S3993XA Unspecified injury of pelvis, initial encounter: Secondary | ICD-10-CM | POA: Diagnosis not present

## 2023-11-29 DIAGNOSIS — R531 Weakness: Secondary | ICD-10-CM | POA: Diagnosis not present

## 2023-11-29 LAB — PROTIME-INR
INR: 1 (ref 0.8–1.2)
Prothrombin Time: 14.2 s (ref 11.4–15.2)

## 2023-11-29 LAB — COMPREHENSIVE METABOLIC PANEL WITH GFR
ALT: 9 U/L (ref 0–44)
AST: 14 U/L — ABNORMAL LOW (ref 15–41)
Albumin: 3.6 g/dL (ref 3.5–5.0)
Alkaline Phosphatase: 83 U/L (ref 38–126)
Anion gap: 9 (ref 5–15)
BUN: 22 mg/dL — ABNORMAL HIGH (ref 6–20)
CO2: 25 mmol/L (ref 22–32)
Calcium: 8.9 mg/dL (ref 8.9–10.3)
Chloride: 103 mmol/L (ref 98–111)
Creatinine, Ser: 1.07 mg/dL — ABNORMAL HIGH (ref 0.44–1.00)
GFR, Estimated: 59 mL/min — ABNORMAL LOW (ref >60)
Glucose, Bld: 151 mg/dL — ABNORMAL HIGH (ref 70–99)
Potassium: 3.8 mmol/L (ref 3.5–5.1)
Sodium: 137 mmol/L (ref 135–145)
Total Bilirubin: 0.4 mg/dL (ref 0.0–1.2)
Total Protein: 6.2 g/dL — ABNORMAL LOW (ref 6.5–8.1)

## 2023-11-29 LAB — URINALYSIS, ROUTINE W REFLEX MICROSCOPIC
Bilirubin Urine: NEGATIVE
Glucose, UA: NEGATIVE mg/dL
Hgb urine dipstick: NEGATIVE
Ketones, ur: NEGATIVE mg/dL
Leukocytes,Ua: NEGATIVE
Nitrite: NEGATIVE
Protein, ur: NEGATIVE mg/dL
Specific Gravity, Urine: 1.018 (ref 1.005–1.030)
pH: 5 (ref 5.0–8.0)

## 2023-11-29 LAB — CBC
HCT: 32.9 % — ABNORMAL LOW (ref 36.0–46.0)
Hemoglobin: 11.3 g/dL — ABNORMAL LOW (ref 12.0–15.0)
MCH: 31.9 pg (ref 26.0–34.0)
MCHC: 34.3 g/dL (ref 30.0–36.0)
MCV: 92.9 fL (ref 80.0–100.0)
Platelets: 325 K/uL (ref 150–400)
RBC: 3.54 MIL/uL — ABNORMAL LOW (ref 3.87–5.11)
RDW: 14.2 % (ref 11.5–15.5)
WBC: 8.9 K/uL (ref 4.0–10.5)
nRBC: 0 % (ref 0.0–0.2)

## 2023-11-29 LAB — DIFFERENTIAL
Abs Immature Granulocytes: 0.14 K/uL — ABNORMAL HIGH (ref 0.00–0.07)
Basophils Absolute: 0 K/uL (ref 0.0–0.1)
Basophils Relative: 0 %
Eosinophils Absolute: 0 K/uL (ref 0.0–0.5)
Eosinophils Relative: 0 %
Immature Granulocytes: 2 %
Lymphocytes Relative: 6 %
Lymphs Abs: 0.5 K/uL — ABNORMAL LOW (ref 0.7–4.0)
Monocytes Absolute: 0.6 K/uL (ref 0.1–1.0)
Monocytes Relative: 7 %
Neutro Abs: 7.6 K/uL (ref 1.7–7.7)
Neutrophils Relative %: 85 %

## 2023-11-29 LAB — CBG MONITORING, ED: Glucose-Capillary: 157 mg/dL — ABNORMAL HIGH (ref 70–99)

## 2023-11-29 LAB — APTT: aPTT: 28 s (ref 24–36)

## 2023-11-29 MED ORDER — IOHEXOL 300 MG/ML  SOLN
100.0000 mL | Freq: Once | INTRAMUSCULAR | Status: AC | PRN
  Administered 2023-11-29: 75 mL via INTRAVENOUS

## 2023-11-29 MED ORDER — ACETAMINOPHEN 500 MG PO TABS
1000.0000 mg | ORAL_TABLET | Freq: Once | ORAL | Status: AC
  Administered 2023-11-29: 1000 mg via ORAL
  Filled 2023-11-29: qty 2

## 2023-11-29 MED ORDER — LIDOCAINE 5 % EX PTCH: 1.0000 | MEDICATED_PATCH | CUTANEOUS | 0 refills | Status: DC

## 2023-11-29 MED ORDER — OXYCODONE HCL 5 MG PO TABS
15.0000 mg | ORAL_TABLET | Freq: Once | ORAL | Status: AC
  Administered 2023-11-29: 15 mg via ORAL
  Filled 2023-11-29: qty 3

## 2023-11-29 NOTE — ED Provider Notes (Signed)
  Physical Exam  BP 133/85   Pulse 75   Temp 97.9 F (36.6 C) (Oral)   Resp 11   LMP 06/12/2016 (Approximate)   SpO2 100%   Physical Exam Vitals and nursing note reviewed.  Constitutional:      Appearance: Normal appearance.  HENT:     Head: Normocephalic and atraumatic.  Eyes:     General:        Right eye: No discharge.        Left eye: No discharge.     Conjunctiva/sclera: Conjunctivae normal.  Pulmonary:     Effort: Pulmonary effort is normal.  Chest:     Comments: Tenderness to the right lateral chest wall.  Skin:    General: Skin is warm and dry.     Findings: No rash.  Neurological:     General: No focal deficit present.     Mental Status: She is alert.  Psychiatric:        Mood and Affect: Mood normal.        Behavior: Behavior normal.     Procedures  Procedures  ED Course / MDM   Clinical Course as of 11/29/23 1708  Tue Nov 29, 2023  1531 CT Cervical Spine Wo Contrast [JR]    Clinical Course User Index [JR] Lang Norleen POUR, PA-C   Medical Decision Making Amount and/or Complexity of Data Reviewed Radiology: ordered. Decision-making details documented in ED Course.  Risk OTC drugs. Prescription drug management.   Accepted handoff at shift change from [facility]. Please see prior provider note for more detail.   Briefly: Patient is 59 y.o. female who presents to the emergency department today for further evaluation of multiple falls.  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  Complaining of right lateral chest wall pain  DDX: concern for fracture  Plan: Discharge home.  Will provide lidocaine  patches to go home with.  No evidence of rib fractures today.  Will treat for rib contusion.  Patient agreeable with plan.  Strict return precautions were discussed.  She is safe for discharge.              Theotis Cameron HERO, PA-C 11/29/23 1709    Gennaro Duwaine CROME, DO 12/01/23 1721

## 2023-11-29 NOTE — Discharge Instructions (Addendum)
 I have given you some lidocaine  patches.  You can use this as prescribed.  You can take Tylenol  as needed for pain.  You may return to the emergency department for any worsening symptoms.

## 2023-11-29 NOTE — ED Provider Triage Note (Signed)
 Emergency Medicine Provider Triage Evaluation Note  Cassidy [name] , a 58 y.o. female  was evaluated in triage.  Pt complains of primarily right sided rib discomfort after a fall 3 days prior.  She was going to open a door when she suddenly felt weak on her right side, was unable to move her right side has baseline numbness and weakness in the lower extremities secondary to neuropathy.  Pain with deep inspiration, movement of the torso.  Denies having any current focal weakness at this time, states that they resolved within an hour of onset on Saturday and have not recurred.  Previous diagnosis of type 2 diabetes, stage IV adenocarcinoma of the right lung.  Review of Systems  Positive: As above Negative:   Physical Exam  BP 124/76   Pulse 74   Temp 97.9 F (36.6 C) (Oral)   Resp 18   LMP 06/12/2016 (Approximate)   SpO2 98%  Gen:   Awake, no distress   Resp:  Normal effort  MSK:   Moves extremities without difficulty  Other:  Focal tenderness to the right thorax specifically in the area of 6th through 7th ribs  Medical Decision Making  Medically screening exam initiated at 1:06 PM.  Appropriate orders placed.  NEKA BISE was informed that the remainder of the evaluation will be completed by another provider, this initial triage assessment does not replace that evaluation, and the importance of remaining in the ED until their evaluation is complete.  Secondary to reported focal weakness with resolution, initiated order stat for TIA as well as chest imaging for fall with right sided thoracic pain.   Myriam Dorn BROCKS, [place] 11/29/23 1309

## 2023-11-29 NOTE — ED Provider Notes (Signed)
 [place] EMERGENCY DEPARTMENT AT [facility] Provider Note   CSN: [id number] Arrival date & time: 11/29/23  1248     Patient presents with: Rib Injury  HPI Cassidy [name] is a 58 y.o. 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  On Eliquis .  She states she fell on Friday and Saturday and landed on the right side of her body.  States she did feel weak prior to fall on the right side on Saturday but that has since resolved.  Weakness lasted for about an hour.  Now with right-sided chest and right flank pain.  She also endorses intermittent shortness of breath since the fall.  Pain is worse with deep breathing.  Unsure if she hit her head but denies LOC.  Denies headache or neck pain at this time.     HPI     Prior to Admission medications   Medication Sig Start Date End Date Taking? Authorizing Provider  acetaminophen  (TYLENOL ) 500 MG tablet Take 1 tablet (500 mg total) by mouth every 6 (six) hours as needed. 09/30/23   Tharon Lung, MD  apixaban  (ELIQUIS ) 5 MG TABS tablet Take 1 tablet (5 mg total) by mouth daily. 10/07/23 11/06/23  Rumball, Alison M, DO  artificial tears ophthalmic solution Place 1 drop into both eyes as needed for dry eyes. 09/30/23   Tharon Lung, MD  atorvastatin  (LIPITOR) 40 MG tablet TAKE 1 TABLET (40 MG TOTAL) BY MOUTH DAILY. 04/23/22   Espinoza, Alejandra, DO  busPIRone  (BUSPAR ) 7.5 MG tablet Take 7.5 mg by mouth 2 (two) times daily.    [provider]  Carboxymethylcellul-Glycerin (LUBRICATING EYE DROPS OP) Place 1 drop into both eyes daily as needed (dry eyes).    [provider]  carvedilol  (COREG ) 6.25 MG tablet Take 2 tablets (12.5 mg total) by mouth 2 (two) times daily with a meal. 03/29/23   Bryan Bianchi, MD  dexamethasone  (DECADRON ) 4 MG tablet Take 1 tablet (4 mg total) by mouth 2 (two) times daily. 10/17/23   Lanell Donald Stagger, PA-C  diclofenac  Sodium (VOLTAREN ) 1 %  GEL Apply 1 application topically 3 times daily as needed for pain 12/28/22   Nicholas Bar, MD  docusate sodium  (COLACE) 100 MG capsule Take 1 capsule (100 mg total) by mouth daily. 11/22/22   Nicholas Bar, MD  DULoxetine  (CYMBALTA ) 30 MG capsule Take 1 capsule (30 mg total) by mouth daily. 06/23/22   Leigh Venetia CROME, MD  feeding supplement (ENSURE PLUS HIGH PROTEIN) LIQD Take 237 mLs by mouth 2 (two) times daily between meals. 10/01/23   Tharon Lung, MD  fentaNYL  (DURAGESIC ) 25 MCG/HR Place 1 patch onto the skin every 3 (three) days. 06/08/23   Pickenpack-Cousar, Fannie SAILOR, NP  ferrous sulfate  325 (65 FE) MG tablet Take 1 tablet (325 mg total) by mouth daily with breakfast. 11/22/22   Nicholas Bar, MD  fluticasone  (FLONASE ) 50 MCG/ACT nasal spray PLACE 1 SPRAY INTO BOTH NOSTRILS DAILY AS NEEDED (CONGESTION). 02/23/23   Tharon Lung, MD  folic acid  (FOLVITE ) 1 MG tablet TAKE 1 TABLET (1 MG TOTAL) BY MOUTH DAILY. 12/20/22   Sherrod Sherrod, MD  gabapentin  (NEURONTIN ) 300 MG capsule Take 2 capsules (600 mg total) by mouth 3 (three) times daily. 06/08/23   Pickenpack-Cousar, Athena N, NP  glucose blood (ACCU-CHEK AVIVA PLUS) test strip USE AS INSTRUCTED EVERY DAY 03/23/23   Delores Suzann HERO, MD  hyoscyamine  (LEVSIN  SL) 0.125 MG  SL tablet Place 1 tablet (0.125 mg total) under the tongue every 6 (six) hours as needed. For abdominal cramping 03/10/22   Heilingoetter, Cassandra L, PA-C  insulin  glargine (LANTUS ) 100 UNIT/ML Solostar Pen Inject 8 Units into the skin every morning. 10/07/23   Rumball, Alison M, DO  Insulin  Pen Needle 32G X 4 MM MISC 1 Needle by Does not apply route in the morning and at bedtime. 11/09/23   Nygaard, Joseph, MD  ipratropium (ATROVENT  HFA) 17 MCG/ACT inhaler Inhale 2 puffs into the lungs every 4 (four) hours as needed for wheezing (COPD).    [provider]  lactulose  (CHRONULAC ) 10 GM/15ML solution Take 15 mLs (10 g total) by mouth daily as needed for moderate constipation  or severe constipation. 01/28/23   Shitarev, Dimitry, MD  lidocaine -prilocaine  (EMLA ) cream Apply 1 Application topically daily as needed (for port access). 10/07/23   Rumball, Alison M, DO  LORazepam  (ATIVAN ) 0.5 MG tablet Take 1 tablet (0.5 mg total) by mouth every 8 (eight) hours as needed for anxiety (nausea). 01/19/23   Pickenpack-Cousar, Athena N, NP  metoCLOPramide  (REGLAN ) 10 MG tablet Take 1 tablet (10 mg total) by mouth every 8 (eight) hours as needed for nausea. 10/21/22   Pickenpack-Cousar, Athena N, NP  mometasone -formoterol  (DULERA ) 100-5 MCG/ACT AERO Inhale 2 puffs into the lungs in the morning and at bedtime. 11/22/22   Nicholas Bar, MD  Morphine  Sulfate (MORPHINE  CONCENTRATE) 10 mg / 0.5 ml concentrated solution Take 0.25 mLs by mouth every 6 (six) hours as needed for moderate pain (pain score 4-6). 08/20/23   [provider]  nystatin  (MYCOSTATIN ) 100000 UNIT/ML suspension Take 5 mLs (500,000 Units total) by mouth 4 (four) times daily. 03/31/23   Pickenpack-Cousar, Athena N, NP  ondansetron  (ZOFRAN -ODT) 8 MG disintegrating tablet Take 1 tablet (8 mg total) by mouth every 8 (eight) hours as needed for nausea or vomiting. 01/28/22   Heilingoetter, Cassandra L, PA-C  oxyCODONE  (ROXICODONE ) 15 MG immediate release tablet Take 1-2 tablets (15-30 mg total) by mouth every 4 (four) hours as needed for pain. 06/08/23   Pickenpack-Cousar, Athena N, NP  pantoprazole  (PROTONIX ) 40 MG tablet Take 1 tablet (40 mg total) by mouth 2 (two) times daily. 06/23/22   Zehr, Jessica D, PA-C  polyethylene glycol powder (GAVILAX) 17 GM/SCOOP powder Take 17 g by mouth daily. 11/22/22   Nicholas Bar, MD  torsemide  (DEMADEX ) 20 MG tablet Take 2 tablets (40 mg total) by mouth daily. 06/22/23   Bryan Bianchi, MD  valACYclovir  (VALTREX ) 1000 MG tablet Take for 3 days prn each outbreak. 10/07/23   Madelon Donald HERO, DO    Allergies: Amoxil  [amoxicillin ], Chantix [varenicline], Suboxone [buprenorphine hcl-naloxone  hcl], and Emend [fosaprepitant  dimeglumine]    Review of Systems See HPI  Updated Vital Signs BP 124/76   Pulse 74   Temp 97.9 F (36.6 C) (Oral)   Resp 18   LMP 06/12/2016 (Approximate)   SpO2 98%   Physical Exam Vitals and nursing note reviewed. Exam conducted with a chaperone present.  HENT:     Head: Normocephalic and atraumatic.     Mouth/Throat:     Mouth: Mucous membranes are moist.  Eyes:     General:        Right eye: No discharge.        Left eye: No discharge.     Conjunctiva/sclera: Conjunctivae normal.  Cardiovascular:     Rate and Rhythm: Normal rate and regular rhythm.     Pulses: Normal  pulses.     Heart sounds: Normal heart sounds.  Pulmonary:     Effort: Pulmonary effort is normal.     Breath sounds: Normal breath sounds.  Chest:     Comments: Tenderness about the right lateral chest wall.  No ecchymosis, step off or open wounds.  No rashes. Abdominal:     General: Abdomen is flat.     Palpations: Abdomen is soft.     Comments: Tenderness with palpation of the right flank extending to the right lateral mid abdomen.  Generally the abdomen appears atraumatic.  Skin:    General: Skin is warm and dry.  Neurological:     General: No focal deficit present.     Comments: GCS 15. Speech is goal oriented. No deficits appreciated to CN III-XII; symmetric eyebrow raise, no facial drooping, tongue midline. Patient has equal grip strength bilaterally with 5/5 strength against resistance in all major muscle groups bilaterally. Sensation to light touch intact. Patient moves extremities without ataxia. Normal finger-nose-finger. Patient deferred gait assessment due to pain.   Psychiatric:        Mood and Affect: Mood normal.     (all labs ordered are listed, but only abnormal results are displayed) Labs Reviewed  CBC - Abnormal; Notable for the following components:      Result Value   RBC 3.54 (*)    Hemoglobin 11.3 (*)    HCT 32.9 (*)    All other  components within normal limits  DIFFERENTIAL - Abnormal; Notable for the following components:   Lymphs Abs 0.5 (*)    Abs Immature Granulocytes 0.14 (*)    All other components within normal limits  COMPREHENSIVE METABOLIC PANEL WITH GFR - Abnormal; Notable for the following components:   Glucose, Bld 151 (*)    BUN 22 (*)    Creatinine, Ser 1.07 (*)    Total Protein 6.2 (*)    AST 14 (*)    GFR, Estimated 59 (*)    All other components within normal limits  CBG MONITORING, ED - Abnormal; Notable for the following components:   Glucose-Capillary 157 (*)    All other components within normal limits  PROTIME-INR  APTT  URINALYSIS, ROUTINE W REFLEX MICROSCOPIC    EKG: EKG Interpretation Date/Time:  Tuesday November 29 2023 13:15:59 EST Ventricular Rate:  74 PR Interval:  156 QRS Duration:  180 QT Interval:  396 QTC Calculation: 440 R Axis:   43  Text Interpretation: Sinus rhythm Ventricular premature complex Nonspecific intraventricular conduction delay Confirmed by Cottie Cough [phone]) on 11/29/2023 1:57:11 PM  Radiology: CT Cervical Spine Wo Contrast Result Date: 11/29/2023 EXAM: CT CERVICAL SPINE WITHOUT CONTRAST 11/29/2023 03:11:03 PM TECHNIQUE: CT of the cervical spine was performed without the administration of intravenous contrast. Multiplanar reformatted images are provided for review. Automated exposure control, iterative reconstruction, and/or weight based adjustment of the mA/kV was utilized to reduce the radiation dose to as low as reasonably achievable. COMPARISON: None available. CLINICAL HISTORY: Polytrauma, blunt. FINDINGS: The study is mildly motion degraded. BONES AND ALIGNMENT: Mild reversal of the normal cervical lordosis. No significant listhesis. No acute fracture or destructive lesion. DEGENERATIVE CHANGES: Mild cervical spondylosis without evidence of high grade spinal canal stenosis. SOFT TISSUES: No prevertebral soft tissue swelling. 1 cm right thyroid   nodule for which no follow up imaging is required. IMPRESSION: 1. No acute cervical spine fracture or traumatic malalignment. Electronically signed by: Dasie Hamburg MD 11/29/2023 03:25 PM EST RP Workstation: [id number]   CT  HEAD WO CONTRAST Result Date: 11/29/2023 EXAM: CT HEAD WITHOUT CONTRAST 11/29/2023 03:11:03 PM TECHNIQUE: CT of the head was performed without the administration of intravenous contrast. Automated exposure control, iterative reconstruction, and/or weight based adjustment of the mA/kV was utilized to reduce the radiation dose to as low as reasonably achievable. COMPARISON: MRI head at 09/29/2023. CLINICAL HISTORY: Neuro deficit, acute, stroke suspected. History of lung cancer. FINDINGS: BRAIN AND VENTRICLES: An approximately 5.0 x 3.2 cm mass centered in the left parietal lobe is similar in size to the prior MRI. Heterogeneous hyperdensity within the inferior aspect of the mass may reflect hemorrhage. Extensive surrounding vasogenic edema is similar to the prior MRI with regional sulcal effacement and effacement of the posterior aspect of the left lateral ventricle but no significant midline shift. No acute large territory infarct, hydrocephalus, or extra axial fluid collection is evident. Calcified atherosclerosis at the skull base. ORBITS: No acute abnormality. SINUSES: No acute abnormality. SOFT TISSUES AND SKULL: No acute soft tissue abnormality. No skull fracture. IMPRESSION: 1. 5 cm left parietal mass with intralesional hemorrhage, similar in size to the prior MRI and with similar extensive edema. No midline shift. Electronically signed by: Dasie Hamburg MD 11/29/2023 03:21 PM EST RP Workstation: [id number]   DG Chest 1 View Result Date: 11/29/2023 EXAM: 1 VIEW(S) XRAY OF THE CHEST 11/29/2023 01:32:00 PM COMPARISON: 09/26/2023, status post coronary artery bypass graft. CLINICAL HISTORY: Right sided rib pain after fall. FINDINGS: LINES, TUBES AND DEVICES: Left-sided Port-a-Cath is  unchanged. LUNGS AND PLEURA: Elevated right hemidiaphragm with minimal right basilar subsegmental atelectasis or scarring. No pleural effusion. No pneumothorax. HEART AND MEDIASTINUM: Status post coronary artery bypass graft. BONES AND SOFT TISSUES: No acute osseous abnormality. IMPRESSION: 1. Elevated right hemidiaphragm with minimal right basilar subsegmental atelectasis or scarring. Electronically signed by: Lynwood Seip MD 11/29/2023 01:53 PM EST RP Workstation: [id number]     Procedures   Medications Ordered in the ED  oxyCODONE  (Oxy IR/ROXICODONE ) immediate release tablet 15 mg (has no administration in time range)  acetaminophen  (TYLENOL ) tablet 1,000 mg (has no administration in time range)  iohexol  (OMNIPAQUE ) 300 MG/ML solution 100 mL (75 mLs Intravenous Contrast Given 11/29/23 1443)    Clinical Course as of 11/29/23 1534  Tue Nov 29, 2023  1531 CT Cervical Spine Wo Contrast [JR]    Clinical Course User Index [JR] Lang Norleen POUR, PA-C                                 Medical Decision Making Amount and/or Complexity of Data Reviewed Radiology: ordered.  Risk Prescription drug management.  58 year old well-appearing female presenting for falls and right-sided chest wall and flank pain. Exam notable for some tenderness about the right chest wall and flank but otherwise no obvious evidence of trauma and discernible asymmetric weakness of her extremities.  CT of her head redemonstrating 5 cm left parietal mass with intralesional hemorrhage but appears similar in size to prior MRI.  Chest x-ray showing elevated right hemidiaphragm with minimal right basilar subsegmental atelectasis or scarring otherwise no acute osseous abnormality noted.  CT cervical spine is unremarkable.  CT chest abdomen pelvis is pending.  If CT is reassuring, anticipate discharge otherwise will need further management.  EKG showing sinus rhythm.  Signed out patient to PA [employer].      Final diagnoses:   Fall, initial encounter    ED Discharge Orders     None  Lang Norleen POUR, PA-C 11/29/23 1534    Cottie Donnice PARAS, MD 11/29/23 [phone]

## 2023-11-29 NOTE — ED Triage Notes (Signed)
 Pt BIB EMS from home for right sided rib pain and swelling after two falls on Friday and Saturday. Pt is on blood thinners and cannot remember if she hit her head. However, she did not lose consciousness. Pt stated the pain gets worse with deep breathing.   Hx Lung and Brain cancer

## 2024-01-09 ENCOUNTER — Inpatient Hospital Stay: Attending: Internal Medicine

## 2024-01-23 ENCOUNTER — Telehealth: Payer: Self-pay | Admitting: Medical Oncology

## 2024-01-23 NOTE — Telephone Encounter (Signed)
 Pt left message that she has issues and  wants to  get back on track with her cancer problem.

## 2024-01-24 ENCOUNTER — Telehealth: Payer: Self-pay | Admitting: Medical Oncology

## 2024-01-24 ENCOUNTER — Encounter: Payer: Self-pay | Admitting: Internal Medicine

## 2024-01-24 NOTE — Telephone Encounter (Signed)
 I returned pt call and LVM to call me back.

## 2024-01-25 ENCOUNTER — Telehealth: Payer: Self-pay

## 2024-01-25 NOTE — Telephone Encounter (Signed)
 Spoke with patient regarding her care. Patient reported that she had been on hospice and was discharged from hospice on Monday, 01/23/24. She stated that she would like to resume care with Dr. Sherrod and palliative care services with Levon, NP.  Informed patient that this information would be relayed to Dr. Sherrod. Call was transferred to the palliative care team for further assistance.

## 2024-01-26 ENCOUNTER — Inpatient Hospital Stay: Attending: Internal Medicine | Admitting: Licensed Clinical Social Worker

## 2024-01-26 DIAGNOSIS — R180 Malignant ascites: Secondary | ICD-10-CM | POA: Insufficient documentation

## 2024-01-26 DIAGNOSIS — R29818 Other symptoms and signs involving the nervous system: Secondary | ICD-10-CM | POA: Insufficient documentation

## 2024-01-26 DIAGNOSIS — Z880 Allergy status to penicillin: Secondary | ICD-10-CM | POA: Insufficient documentation

## 2024-01-26 DIAGNOSIS — G43909 Migraine, unspecified, not intractable, without status migrainosus: Secondary | ICD-10-CM | POA: Insufficient documentation

## 2024-01-26 DIAGNOSIS — G479 Sleep disorder, unspecified: Secondary | ICD-10-CM | POA: Insufficient documentation

## 2024-01-26 DIAGNOSIS — R590 Localized enlarged lymph nodes: Secondary | ICD-10-CM | POA: Insufficient documentation

## 2024-01-26 DIAGNOSIS — I110 Hypertensive heart disease with heart failure: Secondary | ICD-10-CM | POA: Insufficient documentation

## 2024-01-26 DIAGNOSIS — Z8774 Personal history of (corrected) congenital malformations of heart and circulatory system: Secondary | ICD-10-CM | POA: Insufficient documentation

## 2024-01-26 DIAGNOSIS — J449 Chronic obstructive pulmonary disease, unspecified: Secondary | ICD-10-CM | POA: Insufficient documentation

## 2024-01-26 DIAGNOSIS — C3491 Malignant neoplasm of unspecified part of right bronchus or lung: Secondary | ICD-10-CM

## 2024-01-26 DIAGNOSIS — K429 Umbilical hernia without obstruction or gangrene: Secondary | ICD-10-CM | POA: Insufficient documentation

## 2024-01-26 DIAGNOSIS — M533 Sacrococcygeal disorders, not elsewhere classified: Secondary | ICD-10-CM | POA: Insufficient documentation

## 2024-01-26 DIAGNOSIS — R531 Weakness: Secondary | ICD-10-CM | POA: Insufficient documentation

## 2024-01-26 DIAGNOSIS — E1142 Type 2 diabetes mellitus with diabetic polyneuropathy: Secondary | ICD-10-CM | POA: Insufficient documentation

## 2024-01-26 DIAGNOSIS — Z66 Do not resuscitate: Secondary | ICD-10-CM | POA: Insufficient documentation

## 2024-01-26 DIAGNOSIS — M7989 Other specified soft tissue disorders: Secondary | ICD-10-CM | POA: Insufficient documentation

## 2024-01-26 DIAGNOSIS — M25559 Pain in unspecified hip: Secondary | ICD-10-CM | POA: Insufficient documentation

## 2024-01-26 DIAGNOSIS — R0600 Dyspnea, unspecified: Secondary | ICD-10-CM | POA: Insufficient documentation

## 2024-01-26 DIAGNOSIS — K5903 Drug induced constipation: Secondary | ICD-10-CM | POA: Insufficient documentation

## 2024-01-26 DIAGNOSIS — C3411 Malignant neoplasm of upper lobe, right bronchus or lung: Secondary | ICD-10-CM | POA: Insufficient documentation

## 2024-01-26 DIAGNOSIS — D573 Sickle-cell trait: Secondary | ICD-10-CM | POA: Insufficient documentation

## 2024-01-26 DIAGNOSIS — K449 Diaphragmatic hernia without obstruction or gangrene: Secondary | ICD-10-CM | POA: Insufficient documentation

## 2024-01-26 DIAGNOSIS — Z923 Personal history of irradiation: Secondary | ICD-10-CM | POA: Insufficient documentation

## 2024-01-26 DIAGNOSIS — Z9089 Acquired absence of other organs: Secondary | ICD-10-CM | POA: Insufficient documentation

## 2024-01-26 DIAGNOSIS — L988 Other specified disorders of the skin and subcutaneous tissue: Secondary | ICD-10-CM | POA: Insufficient documentation

## 2024-01-26 DIAGNOSIS — I2510 Atherosclerotic heart disease of native coronary artery without angina pectoris: Secondary | ICD-10-CM | POA: Insufficient documentation

## 2024-01-26 DIAGNOSIS — C7972 Secondary malignant neoplasm of left adrenal gland: Secondary | ICD-10-CM | POA: Insufficient documentation

## 2024-01-26 DIAGNOSIS — M5430 Sciatica, unspecified side: Secondary | ICD-10-CM | POA: Insufficient documentation

## 2024-01-26 DIAGNOSIS — Z79899 Other long term (current) drug therapy: Secondary | ICD-10-CM | POA: Insufficient documentation

## 2024-01-26 DIAGNOSIS — C7951 Secondary malignant neoplasm of bone: Secondary | ICD-10-CM | POA: Insufficient documentation

## 2024-01-26 DIAGNOSIS — I82401 Acute embolism and thrombosis of unspecified deep veins of right lower extremity: Secondary | ICD-10-CM | POA: Insufficient documentation

## 2024-01-26 DIAGNOSIS — Z8701 Personal history of pneumonia (recurrent): Secondary | ICD-10-CM | POA: Insufficient documentation

## 2024-01-26 DIAGNOSIS — T451X5A Adverse effect of antineoplastic and immunosuppressive drugs, initial encounter: Secondary | ICD-10-CM | POA: Insufficient documentation

## 2024-01-26 DIAGNOSIS — C787 Secondary malignant neoplasm of liver and intrahepatic bile duct: Secondary | ICD-10-CM | POA: Insufficient documentation

## 2024-01-26 DIAGNOSIS — C7971 Secondary malignant neoplasm of right adrenal gland: Secondary | ICD-10-CM | POA: Insufficient documentation

## 2024-01-26 DIAGNOSIS — G893 Neoplasm related pain (acute) (chronic): Secondary | ICD-10-CM | POA: Insufficient documentation

## 2024-01-26 DIAGNOSIS — C7931 Secondary malignant neoplasm of brain: Secondary | ICD-10-CM | POA: Insufficient documentation

## 2024-01-26 DIAGNOSIS — Z7901 Long term (current) use of anticoagulants: Secondary | ICD-10-CM | POA: Insufficient documentation

## 2024-01-26 DIAGNOSIS — Z86718 Personal history of other venous thrombosis and embolism: Secondary | ICD-10-CM | POA: Insufficient documentation

## 2024-01-26 NOTE — Progress Notes (Signed)
 [facility] CSW Progress Note    Interventions: CSW returned pt's call.  Pt reports she is no longer on hospice and wishes to resume care at the cancer center.  Per pt her Medicaid lapsed while on hospice.  CSW instructed pt to contact DSS to reinstate coverage.  Pt will need ostomy and pleurex cath supplies.  Referral sent to Cancer Services to assist pt.  CSW to see pt once care is resumed at cancer center.        Follow Up Plan:  Patient will contact CSW with any support or resource needs    Devere JONELLE Manna, LCSW Clinical Social Worker [facility]    Patient is participating in a Managed Medicaid Plan:  Yes

## 2024-01-30 ENCOUNTER — Encounter: Payer: Self-pay | Admitting: Internal Medicine

## 2024-01-30 ENCOUNTER — Other Ambulatory Visit: Payer: Self-pay | Admitting: Physician Assistant

## 2024-02-02 NOTE — Progress Notes (Unsigned)
 "  I saw Cassidy [name] in neurology clinic on 02/10/24 in follow up for neuropathy.  HPI: Cassidy [name] is a 59 y.o. 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  To briefly review: 04/07/22: Patient had a triple bypass surgery in 06/2021. She was having ongoing chest pain. She was later found to have stage IV lung cancer. She has done radiation and chemotherapy.   She had a history of diabetic neuropathy prior to chemotherapy. Symptoms have worsened with chemotherapy. She describes burning pain into both legs up to her knees. She has burning in her fingers as well. She thinks the left leg may be more weak. She gets dizzy and off balance. She has been given a walker, which she just started using. She denies recent falls. She also has bone pain in hips and back due to bone mets. She takes voltaren  gel, oxycodone , and Xtampza  for pain.    She was previously on gabapentin  600 mg BID. She is not sure there was a great difference. She has been off of this about 1 year ago.   She had some migraines after chemotherapy, but this has improved.   Per most recent oncology note from 03/31/22 by Dr. Sherrod: ASSESSMENT AND PLAN: This is a very pleasant 59 years old African-American female recently diagnosed with a stage IV (T2 a, N2, M1 C) non-small cell lung cancer, adenocarcinoma presented with right upper lobe lung mass in addition to right hilar and mediastinal lymphadenopathy as well as innumerable bilateral pulmonary nodules and bone and liver metastasis diagnosed in August 2023 with positive KRAS G12C mutation and PD-L1 expression of 89%. She is currently undergoing systemic chemotherapy with carboplatin  for AUC of 5, Alimta  500 Mg/M2 and Keytruda  200 Mg IV every 3 weeks status post 10 cycles.  The patient has been tolerating  this treatment fairly well except for the frequent nausea and vomiting.  She is currently on Zofran  ODT with some improvement.  She also has early satiety and interested in seeing a gastroenterologist for further evaluation of her condition.  I will make referral to gastroenterology. She had repeat CT scan of the chest, abdomen and pelvis performed recently.  I personally and independently reviewed the scan and discussed the result with the patient today. Her scan showed no concerning findings for disease progression but there was increasing mesenteric stranding and trace pelvic free fluid with areas of wall thickening along the small bowel loops which are not dilated. I recommended for the patient to continue her current treatment with maintenance Alimta  and Keytruda  and she will proceed with cycle #11 today. For the dyspepsia and epigastric pain, she will continue her current treatment with Protonix . For the pain management she is followed by the palliative care team and currently on Xtampza  ER 13.5 mg every 12 hours in addition to Oxy IR for breakthrough pain. The patient will come back for follow-up visit in 3 weeks for evaluation before starting cycle #12.   Of note, patient is on zoloft  and buspar  for mood. These medications are working well. This is managed by PCP, Dr. Chet.   06/23/22: After discussion with Dr. Chet, the plan was to wean off Zoloft  and start Cymbalta . Patient preferred to wean slowly so that she could use up the Zoloft  she already had from the pharmacy. Patient is  off of the Zoloft , but has not been able to get the Cymbalta . This had not been ordered because I was waiting to hear from the patient about being off Zoloft .   Symptoms in her legs are about the same. She has needle sensations in feet and legs.   Patient continues to get chemo. She has occasional migraines. This is one of the side effects per patient. She uses Excedrin with relief of symptoms.   Patient  also mentions swelling of face and legs. She is on Lasix  for this as needed.  08/10/23: Patient has stage 4 cancer and is getting home hospice. She is losing a lot of weight. She is getting weaker. She knows she is dying. In terms of pain, she has good and bad days. She uses oxycodone  and fentanyl . She takes gabapentin  600 mg TID and Cymbalta  30 mg daily. She is on torsemide  that has helped with swelling. This helps with the pain in her legs. She has fallen about 6 times since last visit. She now has a walker.   Most recent Assessment and Plan (08/10/23): This is Cassidy [name], a 59 y.o. female with neuropathic pain in bilateral lower extremities. Her symptoms are most consistent with neuropathy with risk factors including diabetes and prior exposure to cisplatin chemotherapy. She also likely has contributions from significant edema in her legs. Unfortunately, patient has stage 4 lung cancer and is under hospice care now and has pains related to this as well.   Plan: -Continue gabapentin  600 mg three times daily -Continue Cymbalta  30 mg daily -Rest of pain control per hospice/pain management -Fall precautions discussed  Since their last visit: ***  No longer in hospice per recent notes and wishes to resume aggressive care.***  Brain mets?***  Taking B12?   MEDICATIONS:  Outpatient Encounter Medications as of 02/10/2024  Medication Sig   acetaminophen  (TYLENOL ) 500 MG tablet Take 1 tablet (500 mg total) by mouth every 6 (six) hours as needed.   apixaban  (ELIQUIS ) 5 MG TABS tablet Take 1 tablet (5 mg total) by mouth daily.   artificial tears ophthalmic solution Place 1 drop into both eyes as needed for dry eyes.   atorvastatin  (LIPITOR) 40 MG tablet TAKE 1 TABLET (40 MG TOTAL) BY MOUTH DAILY.   busPIRone  (BUSPAR ) 7.5 MG tablet Take 7.5 mg by mouth 2 (two) times daily.   Carboxymethylcellul-Glycerin (LUBRICATING EYE DROPS OP) Place 1 drop into both eyes daily as needed (dry eyes).    carvedilol  (COREG ) 6.25 MG tablet Take 2 tablets (12.5 mg total) by mouth 2 (two) times daily with a meal.   dexamethasone  (DECADRON ) 4 MG tablet Take 1 tablet (4 mg total) by mouth 2 (two) times daily.   diclofenac  Sodium (VOLTAREN ) 1 % GEL Apply 1 application topically 3 times daily as needed for pain   docusate sodium  (COLACE) 100 MG capsule Take 1 capsule (100 mg total) by mouth daily.   DULoxetine  (CYMBALTA ) 30 MG capsule Take 1 capsule (30 mg total) by mouth daily.   feeding supplement (ENSURE PLUS HIGH PROTEIN) LIQD Take 237 mLs by mouth 2 (two) times daily between meals.   fentaNYL  (DURAGESIC ) 25 MCG/HR Place 1 patch onto the skin every 3 (three) days.   ferrous sulfate  325 (65 FE) MG tablet Take 1 tablet (325 mg total) by mouth daily with breakfast.   fluticasone  (FLONASE ) 50 MCG/ACT nasal spray PLACE 1 SPRAY INTO BOTH NOSTRILS DAILY AS NEEDED (CONGESTION).   folic acid  (FOLVITE ) 1 MG tablet TAKE  1 TABLET (1 MG TOTAL) BY MOUTH DAILY.   gabapentin  (NEURONTIN ) 300 MG capsule Take 2 capsules (600 mg total) by mouth 3 (three) times daily.   glucose blood (ACCU-CHEK AVIVA PLUS) test strip USE AS INSTRUCTED EVERY DAY   hyoscyamine  (LEVSIN  SL) 0.125 MG SL tablet Place 1 tablet (0.125 mg total) under the tongue every 6 (six) hours as needed. For abdominal cramping   insulin  glargine (LANTUS ) 100 UNIT/ML Solostar Pen Inject 8 Units into the skin every morning.   Insulin  Pen Needle 32G X 4 MM MISC 1 Needle by Does not apply route in the morning and at bedtime.   ipratropium (ATROVENT  HFA) 17 MCG/ACT inhaler Inhale 2 puffs into the lungs every 4 (four) hours as needed for wheezing (COPD).   lactulose  (CHRONULAC ) 10 GM/15ML solution Take 15 mLs (10 g total) by mouth daily as needed for moderate constipation or severe constipation.   lidocaine  (LIDODERM ) 5 % Place 1 patch onto the skin daily. Remove & Discard patch within 12 hours or as directed by MD   lidocaine -prilocaine  (EMLA ) cream Apply 1  Application topically daily as needed (for port access).   LORazepam  (ATIVAN ) 0.5 MG tablet Take 1 tablet (0.5 mg total) by mouth every 8 (eight) hours as needed for anxiety (nausea).   metoCLOPramide  (REGLAN ) 10 MG tablet Take 1 tablet (10 mg total) by mouth every 8 (eight) hours as needed for nausea.   mometasone -formoterol  (DULERA ) 100-5 MCG/ACT AERO Inhale 2 puffs into the lungs in the morning and at bedtime.   Morphine  Sulfate (MORPHINE  CONCENTRATE) 10 mg / 0.5 ml concentrated solution Take 0.25 mLs by mouth every 6 (six) hours as needed for moderate pain (pain score 4-6).   nystatin  (MYCOSTATIN ) 100000 UNIT/ML suspension Take 5 mLs (500,000 Units total) by mouth 4 (four) times daily.   ondansetron  (ZOFRAN -ODT) 8 MG disintegrating tablet Take 1 tablet (8 mg total) by mouth every 8 (eight) hours as needed for nausea or vomiting.   oxyCODONE  (ROXICODONE ) 15 MG immediate release tablet Take 1-2 tablets (15-30 mg total) by mouth every 4 (four) hours as needed for pain.   pantoprazole  (PROTONIX ) 40 MG tablet Take 1 tablet (40 mg total) by mouth 2 (two) times daily.   polyethylene glycol powder (GAVILAX) 17 GM/SCOOP powder Take 17 g by mouth daily.   torsemide  (DEMADEX ) 20 MG tablet Take 2 tablets (40 mg total) by mouth daily.   valACYclovir  (VALTREX ) 1000 MG tablet Take for 3 days prn each outbreak.   No facility-administered encounter medications on file as of 02/10/2024.    PAST MEDICAL HISTORY: Past Medical History:  Diagnosis Date   Adenocarcinoma of right lung, stage 4 (HCC) 08/26/2021   Anemia    has sickle cell trait   Atherosclerotic heart disease of native coronary artery with angina pectoris 05/2012, 10/2013   a.  s/p PCTA to dRCA and ostial RPAV-PLA vessel + PDA branch (05/2012)  b. [place]  s/p DES to mLAD - Resolute DES 3.0 x 22 (3.25mm -->3.3 mm)   Bipolar depression (HCC)    Breast abscess    a. right side.    COPD (chronic obstructive pulmonary disease) (HCC)    Depression     Diabetic peripheral neuropathy (HCC)    Fibromyalgia    Genital warts    GERD (gastroesophageal reflux disease)    History of hiatal hernia    HLD (hyperlipidemia)    Hypertension    Lung cancer (HCC)    Pain in limb    a.  LE VENOUS DUPLEX, 02/05/2009 - no evidence of deep vein thrombosis, Baker's cyst   Pneumonia    PTSD (post-traumatic stress disorder)    Sickle cell trait    Tobacco abuse    Tooth caries    Type II diabetes mellitus (HCC)     PAST SURGICAL HISTORY: Past Surgical History:  Procedure Laterality Date   BLADDER SURGERY  1980   TVT   BLADDER SURGERY  2003   TVT   BREAST EXCISIONAL BIOPSY     BREAST SURGERY Right    I&D for multiple abscesses   CARDIAC CATHETERIZATION     CORONARY ARTERY BYPASS GRAFT N/A 06/30/2021   Procedure: CORONARY ARTERY BYPASS GRAFTING (CABG) x3 USING LEFT INTERNAL MAMMARY ARTERY,  RIGHT RADIAL ARTERY AND LEFT GREATER SAPHENOUS VEIN;  Surgeon: Shyrl Linnie KIDD, MD;  Location: [facility] OR;  Service: Open Heart Surgery;  Laterality: N/A;   cutting balloon     ENDOVEIN HARVEST OF GREATER SAPHENOUS VEIN Left 06/30/2021   Procedure: ENDOVEIN HARVEST OF GREATER SAPHENOUS VEIN;  Surgeon: Shyrl Linnie KIDD, MD;  Location: [facility] OR;  Service: Open Heart Surgery;  Laterality: Left;   EYE SURGERY Left    laser surgery   FINE NEEDLE ASPIRATION  08/21/2021   Procedure: FINE NEEDLE ASPIRATION (FNA) LINEAR;  Surgeon: Brenna Adine CROME, DO;  Location: [facility];  Service: Pulmonary;;   INCISE AND DRAIN ABCESS  ; 04/26/11; 08/13/11   right breast   INCISION AND DRAINAGE ABSCESS Right 05/09/2012   Procedure: INCISION AND DRAINAGE RIGHT BREAST ABSCESS;  Surgeon: Donnice POUR. Belinda, MD;  Location: [facility] OR;  Service: General;  Laterality: Right;   INCISION AND DRAINAGE ABSCESS Right 02/08/2014   Procedure: INCISION AND DRAINAGE RIGHT BREAST ABSCESS;  Surgeon: Donnice Belinda, MD;  Location: [facility];  Service: General;  Laterality: Right;   INCISION AND DRAINAGE  ABSCESS Right 06/14/2014   Procedure: INCISION AND DRAINAGE RIGHT BREAST ABSCESS;  Surgeon: Donnice Belinda, MD;  Location: [facility];  Service: General;  Laterality: Right;   IR IMAGING GUIDED PORT INSERTION  12/08/2021   IR PARACENTESIS  02/02/2023   IR PARACENTESIS  02/23/2023   IR PERC TUN PERIT CATH WO PORT S&I /IMAG  02/16/2023   IRRIGATION AND DEBRIDEMENT ABSCESS  12/19/2010   Procedure: IRRIGATION AND DEBRIDEMENT ABSCESS;  Surgeon: Donnice Bury, MD;  Location: [facility];  Service: General;  Laterality: Right;   IRRIGATION AND DEBRIDEMENT ABSCESS  08/13/2011   Procedure: MINOR INCISION AND DRAINAGE OF ABSCESS;  Surgeon: Lynwood KIDD Pina, MD;  Location: [facility];  Service: General;  Laterality: Right;  Right Breast    IRRIGATION AND DEBRIDEMENT ABSCESS  11/17/2011   Procedure: IRRIGATION AND DEBRIDEMENT ABSCESS;  Surgeon: Donnice POUR. Belinda, MD;  Location: [facility];  Service: General;  Laterality: Right;  irrigation and debridement right recurrent breast abscess   LARYNX SURGERY     LEFT HEART CATH AND CORONARY ANGIOGRAPHY N/A 04/09/2021   Procedure: LEFT HEART CATH AND CORONARY ANGIOGRAPHY;  Surgeon: Dann Candyce RAMAN, MD;  Location: [facility] INVASIVE CV LAB;  Service: Cardiovascular;  Laterality: N/A;   LEFT HEART CATHETERIZATION WITH CORONARY ANGIOGRAM N/A 05/29/2012   Procedure: LEFT HEART CATHETERIZATION WITH CORONARY ANGIOGRAM & PTCA;  Surgeon: Debby DELENA Sor, MD;  Location: [facility] CATH LAB;  Service: Cardiovascular; Bifurcation dRCA-RPAD/PLA & rPDA  PTCA.   LEFT HEART CATHETERIZATION WITH CORONARY ANGIOGRAM N/A 11/08/2013   Procedure: LEFT HEART CATHETERIZATION WITH CORONARY ANGIOGRAM and Coronary Stent Intervention;  Surgeon: Alm LELON Clay, MD;  Location: [facility] CATH LAB;  Service: Cardiovascular; mLAD 80% (FFR 0.73) - resolute DES 3.0 x 22 mm (postdilated to 3.3 mm->3.5 mm)   NM MYOVIEW  LTD  01/26/2009   Normal study, no evidence of ischemia, EF 67%   ployp removed from voice box 03/30/12     RADIAL ARTERY  HARVEST Right 06/30/2021   Procedure: RADIAL ARTERY HARVEST;  Surgeon: Shyrl Linnie KIDD, MD;  Location: [facility];  Service: Open Heart Surgery;  Laterality: Right;   REFRACTIVE SURGERY  ~ 2010   right   TEE WITHOUT CARDIOVERSION N/A 06/30/2021   Procedure: TRANSESOPHAGEAL ECHOCARDIOGRAM (TEE);  Surgeon: Shyrl Linnie KIDD, MD;  Location: [facility] OR;  Service: Open Heart Surgery;  Laterality: N/A;   TONSILLECTOMY  1988   VIDEO BRONCHOSCOPY WITH ENDOBRONCHIAL ULTRASOUND Bilateral 08/21/2021   Procedure: VIDEO BRONCHOSCOPY WITH ENDOBRONCHIAL ULTRASOUND;  Surgeon: Brenna Adine CROME, DO;  Location: [facility];  Service: Pulmonary;  Laterality: Bilateral;    ALLERGIES: Allergies[1]  FAMILY HISTORY: Family History  Problem Relation Age of Onset   Esophageal cancer Mother    COPD Mother    Cancer Mother        lymphoma   Heart disease Mother    Stomach cancer Mother    Hyperlipidemia Father    Heart disease Father    Cancer Maternal Aunt        breast, colon   Crohn's disease Maternal Aunt    Hypertension Maternal Grandmother    Diabetes Maternal Grandmother    Hypertension Brother    Rectal cancer Neg Hx    Liver cancer Neg Hx    Colon cancer Neg Hx    Breast cancer Neg Hx     SOCIAL HISTORY: Social History[2] Social History   Social History Narrative   Work: disabilityChildren: son, 15 yrs oldRegular exercise: some/ heart attack in May/ walks 1 mile 3 days a weekCaffeine use: daily, cup o.lbf coffee daily   Left hand, drinks caffeine, living in apartment upstair 8 step.    Objective:  Vital Signs:  LMP 06/12/2016   General:*** General appearance: Awake and alert. No distress. Cooperative with exam.  Skin: No obvious rash or jaundice. HEENT: Atraumatic. Anicteric. Lungs: Non-labored breathing on room air  Heart: Regular Abdomen: Soft, non tender. Extremities: No edema. No obvious deformity.  Musculoskeletal: No obvious joint swelling.  Neurological: Mental  Status: Alert. Speech fluent. No pseudobulbar affect Cranial Nerves: CNII: No RAPD. Visual fields intact. CNIII, IV, VI: PERRL. No nystagmus. EOMI. CN V: Facial sensation intact bilaterally to fine touch. [place] clench strong. Jaw jerk***. CN VII: Facial muscles symmetric and strong. No ptosis at rest or after sustained upgaze***. CN VIII: Hears finger rub well bilaterally. CN IX: No hypophonia. CN X: Palate elevates symmetrically. CN XI: Full strength shoulder shrug bilaterally. CN XII: Tongue protrusion full and midline. No atrophy or fasciculations. No significant dysarthria*** Motor: Tone is ***. *** fasciculations in *** extremities. *** atrophy. No grip or percussive myotonia.  Individual muscle group testing (MRC grade out of 5):  Movement     Neck flexion ***    Neck extension ***     Right Left   Shoulder abduction *** ***   Shoulder adduction *** ***   Shoulder ext rotation *** ***   Shoulder int rotation *** ***   Elbow flexion *** ***   Elbow extension *** ***   Wrist extension *** ***   Wrist flexion *** ***   Finger abduction - FDI *** ***   Finger  abduction - ADM *** ***   Finger extension *** ***   Finger distal flexion - 2/3 *** ***   Finger distal flexion - 4/5 *** ***   Thumb flexion - FPL *** ***   Thumb abduction - APB *** ***    Hip flexion *** ***   Hip extension *** ***   Hip adduction *** ***   Hip abduction *** ***   Knee extension *** ***   Knee flexion *** ***   Dorsiflexion *** ***   Plantarflexion *** ***   Inversion *** ***   Eversion *** ***   Great toe extension *** ***   Great toe flexion *** ***     Reflexes:  Right Left  Bicep *** ***  Tricep *** ***  BrRad *** ***  Knee *** ***  Ankle *** ***   Pathological Reflexes: Babinski: *** response bilaterally*** Hoffman: *** Troemner: *** Pectoral: *** Palmomental: *** Facial: *** Midline tap: *** Sensation: Pinprick: *** Vibration: *** Temperature:  *** Proprioception: *** Coordination: Intact finger-to- nose-finger and heel-to-shin bilaterally. Romberg negative.*** Gait: Able to rise from chair with arms crossed unassisted. Normal, narrow-based gait. Able to tandem walk. Able to walk on toes and heels.***   Lab and Test Review: New results: 11/29/23: CMP significant for glucose 151, BUN 22, Cr 1.07 CBC significant for Hb 11.3, MCV 92.9  09/29/23: HbA1c: 5.0 TSH wnl B12: 268 B1 wnl  MRI brain w/wo contrast (09/29/23): IMPRESSION: 1. 5.0 cm enhancing mass in the left parietal lobe with surrounding vasogenic edema, most concerning for metastatic disease. 2. Local mass effect and partial effacement of the left lateral ventricle. No midline shift. 3. Mild dural enhancement overlying the mass. 4. Mass abuts the superior sagittal sinus with evidence of invasion. 5. No additional intracranial lesions noted.  CT head wo contrast (11/29/23): IMPRESSION: 1. 5 cm left parietal mass with intralesional hemorrhage, similar in size to the prior MRI and with similar extensive edema. No midline shift.  CT cervical spine wo contrast (11/29/23): BONES AND ALIGNMENT: Mild reversal of the normal cervical lordosis. No significant listhesis. No acute fracture or destructive lesion.   DEGENERATIVE CHANGES: Mild cervical spondylosis without evidence of high grade spinal canal stenosis.   SOFT TISSUES: No prevertebral soft tissue swelling. 1 cm right thyroid  nodule for which no follow up imaging is required.   IMPRESSION: 1. No acute cervical spine fracture or traumatic malalignment.  Previously reviewed results: 04/10/23: CMP significant for albumin  1.8 CBC significant for Hb 8.8, MCV 92.4 HbA1c: 5.5   TSH (01/19/23): 8.107   04/07/22: IFE: no M protein B12: 822 B1: not able to be collected   04/21/22: CMP unremarkable TSH wnl   06/01/22: CBC significant for Hb 9.4, MCV 103.4            Lab Results  Component Value Date     HGBA1C 6.2 02/02/2022      Recent Labs[] Expand by Default           Lab Results  Component Value Date    ESRSEDRATE 24 08/31/2021      HbA1c (10/18/15): 7.7   Imaging: MRI brain w/wo contrast (01/26/22): FINDINGS: Brain: No abnormal enhancement. The suspicious area lateral to the frontal horn of the left lateral ventricle is not seen today.   Area of diffusion hyperintensity in the subcortical anterior right frontal lobe is persistent and from shine through, this area is dark on postcontrast assessment.   No incidental infarct, hemorrhage, hydrocephalus, or collection. 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 The 2 smaller are no longer seen and the midline lesion overlying the superior sagittal sinus is regressed to a 7 mm area of enhancement that is not directly contiguous with a vessel.   Sinuses/Orbits: Negative   IMPRESSION: 1. No intracranial enhancement to suggest metastatic disease, including at the left frontal white matter site highlighted previously. 2. Notable regression of calvarial metastases.   MRI lumbar spine wo contrast (07/23/14): IMPRESSION: Mild lumbar degenerative change without significant stenosis or neural impingement.  ASSESSMENT: This is Cassidy [name], a 59 y.o. female with: ***   Plan: ***B12 1000 mcg daily***  Return to clinic in ***  Total time spent reviewing records, interview, history/exam, documentation, and coordination of care on day of encounter:  *** min  Venetia Potters, MD    [1]  Allergies Allergen Reactions   Amoxil  [Amoxicillin ] Hives   Chantix [Varenicline] Other (See Comments)    Nightmares Hallucinations     Suboxone [Buprenorphine Hcl-Naloxone Hcl] Nausea And Vomiting   Emend [Fosaprepitant  Dimeglumine] Itching    See notes for transfusion on 8/23. Pt reported new  onset of itching after Emend infusion was started. 25mg  Benadryl  IV given per MD. Pt tolerated remaining infusion well.   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

## 2024-02-05 NOTE — Progress Notes (Unsigned)
 [facility] OFFICE PROGRESS NOTE  Cassidy Pac, MD [address] [place] [address]  DIAGNOSIS: Stage IV (T2a, N2, M1c) non-small cell lung cancer, adenocarcinoma presented with right upper lobe lung mass in addition to right hilar and mediastinal lymphadenopathy and innumerable bilateral pulmonary nodules as well as liver and bone metastasis.  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    Detected Alteration(s) / Biomarker(s) Associated FDA-approved therapies  Clinical Trial Availability      % cfDNA or Amplification   KRAS G12C  approved by FDA Adagrasib, Sotorasib Yes   5.3%   IDH1 R132L  approved in other indication Ivosidenib, Olutasidenib Yes      1.9%   PD-L1 expression 89%   PRIOR THERAPY: 1) Palliative radiation to the painful metastatic bone lesions at L4 and the sacrum under the care of Dr. Dewey. Last day on 10/22/21  2) Systemic chemotherapy with carboplatin  for AUC of 5, Alimta  500 Mg/M2 and Keytruda  200 Mg IV every 3 weeks.  First dose September 02, 2021.  Status post 19 cycles.  Starting from cycle #5, the patient will start maintenance Alimta  and Keytruda  IV every 3 weeks. Starting from cycle #10, reduced the dose of Alimta  to 400 mg/m2 due to intolerance. Last dose on 09/15/22. Discontinued due to patient request 3) SBRT to the metastatic brain lesions under the care of Dr. Dewey, completed on 10/17/23  CURRENT THERAPY: Observation   INTERVAL HISTORY: Cassidy [name] 59 y.o. female returns to the clinic today for a follow-up visit accompanied by her friend.  In summary the patient was diagnosed with stage IV lung cancer in August 2023.  She initially underwent palliative radiation to the metastatic painful bone lesions.  She then underwent chemotherapy and immunotherapy.  She discontinued this in September 2024 due to patient request due to intolerance with side effects.  She had been on observation for some time. She then was enrolled in  hospice due to significant weight loss and frequent ascites.  Of note the ascites was not malignant on repeat paracenteses.  She also has congestive heart failure and follows with cardiology.  She was found to have metastatic disease to the brain in October 2025. She was supposed to have tapered of decadron  per their instructions but the patient states that hospice had continued to give her 4 mg of decadron  BID. She now has considerable swelling and ulcerations. Her legs are heavy.   The edema has led to difficulty ambulating, and she describes being unable to walk normally. She has experienced increased appetite and weight gain, which she attributes to ongoing corticosteroid therapy. She is scheduled to see her new primary care provider for medication reconciliation and diuretic management, as she has not had consistent follow-up due to hospice restrictions.  She remains on Eliquis  for anticoagulation following a right lower extremity deep vein thrombosis. She expresses concern regarding the risks of long-term anticoagulation but has not experienced bleeding complications. The thrombus was not re-evaluated during her time in hospice.  Neurological symptoms have worsened over the past two months, including progressive numbness over the left scalp and left ulnar fingers, imbalance, and difficulty maintaining upright posture. She describes 'brain freeze' sensations over the left eye, worsening vision in that eye, and recent onset of migraines. She has scheduled an ophthalmology appointment for visual changes and notes patchy alopecia over the scalp. She reports balance issues.  She was discharged from hospice between January 9th and 12th, 2026, and has resumed outpatient  care. She has had two episodes of fever recently, as well as a gastrointestinal illness with transient diarrhea. Constipation is a chronic issue managed with medication, but she has had intermittent diarrhea over the past two days. She  denies nausea, vomiting, cough, hemoptysis, or significant respiratory distress, though she occasionally experiences raspy breathing. She has previously been prescribed morphine  drops for respiratory symptoms. She frequently feels cold and is aware of her chronic anemia. Of note she has sickle cell trait. She has not noticed any visible bleeding. She is still compliant with her eliquis .  She is here today for evaluation and to get back on track.     MEDICAL HISTORY: Past Medical History:  Diagnosis Date   Adenocarcinoma of right lung, stage 4 (HCC) 08/26/2021   Anemia    has sickle cell trait   Atherosclerotic heart disease of native coronary artery with angina pectoris 05/2012, 10/2013   a.  s/p PCTA to dRCA and ostial RPAV-PLA vessel + PDA branch (05/2012)  b. [place]  s/p DES to mLAD - Resolute DES 3.0 x 22 (3.38mm -->3.3 mm)   Bipolar depression (HCC)    Breast abscess    a. right side.    COPD (chronic obstructive pulmonary disease) (HCC)    Depression    Diabetic peripheral neuropathy (HCC)    Fibromyalgia    Genital warts    GERD (gastroesophageal reflux disease)    History of hiatal hernia    HLD (hyperlipidemia)    Hypertension    Lung cancer (HCC)    Pain in limb    a. LE VENOUS DUPLEX, 02/05/2009 - no evidence of deep vein thrombosis, Baker's cyst   Pneumonia    PTSD (post-traumatic stress disorder)    Sickle cell trait    Tobacco abuse    Tooth caries    Type II diabetes mellitus (HCC)     ALLERGIES:  is allergic to amoxil  [amoxicillin ], chantix [varenicline], suboxone [buprenorphine hcl-naloxone hcl], and emend [fosaprepitant  dimeglumine].  MEDICATIONS:  Current Outpatient Medications  Medication Sig Dispense Refill   apixaban  (ELIQUIS ) 5 MG TABS tablet Take 1 tablet (5 mg total) by mouth 2 (two) times daily. 60 tablet 3   acetaminophen  (TYLENOL ) 500 MG tablet Take 1 tablet (500 mg total) by mouth every 6 (six) hours as needed.     apixaban  (ELIQUIS ) 5 MG TABS tablet  Take 1 tablet (5 mg total) by mouth daily. 30 tablet 0   artificial tears ophthalmic solution Place 1 drop into both eyes as needed for dry eyes.     atorvastatin  (LIPITOR) 40 MG tablet TAKE 1 TABLET (40 MG TOTAL) BY MOUTH DAILY. 90 tablet 3   busPIRone  (BUSPAR ) 7.5 MG tablet Take 7.5 mg by mouth 2 (two) times daily.     Carboxymethylcellul-Glycerin (LUBRICATING EYE DROPS OP) Place 1 drop into both eyes daily as needed (dry eyes).     carvedilol  (COREG ) 6.25 MG tablet Take 2 tablets (12.5 mg total) by mouth 2 (two) times daily with a meal. 60 tablet 0   diclofenac  Sodium (VOLTAREN ) 1 % GEL Apply 1 application topically 3 times daily as needed for pain 200 g 1   docusate sodium  (COLACE) 100 MG capsule Take 1 capsule (100 mg total) by mouth daily. 10 capsule 0   DULoxetine  (CYMBALTA ) 30 MG capsule Take 1 capsule (30 mg total) by mouth daily. 30 capsule 5   feeding supplement (ENSURE PLUS HIGH PROTEIN) LIQD Take 237 mLs by mouth 2 (two) times daily between meals.  fentaNYL  (DURAGESIC ) 75 MCG/HR Place 1 patch onto the skin every 3 (three) days. 10 patch 0   ferrous sulfate  325 (65 FE) MG tablet Take 1 tablet (325 mg total) by mouth daily with breakfast. 30 tablet 2   fluticasone  (FLONASE ) 50 MCG/ACT nasal spray PLACE 1 SPRAY INTO BOTH NOSTRILS DAILY AS NEEDED (CONGESTION). 16 g 1   folic acid  (FOLVITE ) 1 MG tablet TAKE 1 TABLET (1 MG TOTAL) BY MOUTH DAILY. 30 tablet 3   gabapentin  (NEURONTIN ) 300 MG capsule Take 2 capsules (600 mg total) by mouth 2 (two) times daily. 120 capsule 2   glucose blood (ACCU-CHEK AVIVA PLUS) test strip USE AS INSTRUCTED EVERY DAY 50 each 1   hyoscyamine  (LEVSIN  SL) 0.125 MG SL tablet Place 1 tablet (0.125 mg total) under the tongue every 6 (six) hours as needed. For abdominal cramping 30 tablet 0   insulin  glargine (LANTUS ) 100 UNIT/ML Solostar Pen Inject 8 Units into the skin every morning.     Insulin  Pen Needle 32G X 4 MM MISC 1 Needle by Does not apply route in the  morning and at bedtime. 120 each 2   ipratropium (ATROVENT  HFA) 17 MCG/ACT inhaler Inhale 2 puffs into the lungs every 4 (four) hours as needed for wheezing (COPD).     lactulose  (CHRONULAC ) 10 GM/15ML solution Take 15 mLs (10 g total) by mouth daily as needed for moderate constipation or severe constipation. 236 mL 0   lidocaine  (LIDODERM ) 5 % Place 1 patch onto the skin daily. Remove & Discard patch within 12 hours or as directed by MD 30 patch 0   lidocaine -prilocaine  (EMLA ) cream Apply 1 Application topically daily as needed (for port access). 30 g 2   metoCLOPramide  (REGLAN ) 10 MG tablet Take 1 tablet (10 mg total) by mouth every 8 (eight) hours as needed for nausea. 60 tablet 2   mometasone -formoterol  (DULERA ) 100-5 MCG/ACT AERO Inhale 2 puffs into the lungs in the morning and at bedtime. 13 g 3   nystatin  (MYCOSTATIN ) 100000 UNIT/ML suspension Take 5 mLs (500,000 Units total) by mouth 4 (four) times daily. 473 mL 2   ondansetron  (ZOFRAN -ODT) 8 MG disintegrating tablet Take 1 tablet (8 mg total) by mouth every 8 (eight) hours as needed for nausea or vomiting. 30 tablet 2   oxycodone  (ROXICODONE ) 30 MG immediate release tablet Take 1 tablet (30 mg total) by mouth every 4 (four) hours as needed for pain. 90 tablet 0   pantoprazole  (PROTONIX ) 40 MG tablet Take 1 tablet (40 mg total) by mouth 2 (two) times daily. 60 tablet 5   polyethylene glycol powder (GAVILAX) 17 GM/SCOOP powder Take 17 g by mouth daily. 850 g 1   torsemide  (DEMADEX ) 20 MG tablet Take 2 tablets (40 mg total) by mouth daily. 35 tablet 0   valACYclovir  (VALTREX ) 1000 MG tablet Take for 3 days prn each outbreak. 30 tablet 11   No current facility-administered medications for this visit.    SURGICAL HISTORY:  Past Surgical History:  Procedure Laterality Date   BLADDER SURGERY  1980   TVT   BLADDER SURGERY  2003   TVT   BREAST EXCISIONAL BIOPSY     BREAST SURGERY Right    I&D for multiple abscesses   CARDIAC  CATHETERIZATION     CORONARY ARTERY BYPASS GRAFT N/A 06/30/2021   Procedure: CORONARY ARTERY BYPASS GRAFTING (CABG) x3 USING LEFT INTERNAL MAMMARY ARTERY,  RIGHT RADIAL ARTERY AND LEFT GREATER SAPHENOUS VEIN;  Surgeon: Shyrl Linnie KIDD, MD;  Location: [facility] OR;  Service: Open Heart Surgery;  Laterality: N/A;   cutting balloon     ENDOVEIN HARVEST OF GREATER SAPHENOUS VEIN Left 06/30/2021   Procedure: ENDOVEIN HARVEST OF GREATER SAPHENOUS VEIN;  Surgeon: Shyrl Linnie KIDD, MD;  Location: [facility] OR;  Service: Open Heart Surgery;  Laterality: Left;   EYE SURGERY Left    laser surgery   FINE NEEDLE ASPIRATION  08/21/2021   Procedure: FINE NEEDLE ASPIRATION (FNA) LINEAR;  Surgeon: Brenna Adine CROME, DO;  Location: [facility] ENDOSCOPY;  Service: Pulmonary;;   INCISE AND DRAIN ABCESS  ; 04/26/11; 08/13/11   right breast   INCISION AND DRAINAGE ABSCESS Right 05/09/2012   Procedure: INCISION AND DRAINAGE RIGHT BREAST ABSCESS;  Surgeon: Donnice POUR. Belinda, MD;  Location: [facility] OR;  Service: General;  Laterality: Right;   INCISION AND DRAINAGE ABSCESS Right 02/08/2014   Procedure: INCISION AND DRAINAGE RIGHT BREAST ABSCESS;  Surgeon: Donnice Belinda, MD;  Location: [facility] OR;  Service: General;  Laterality: Right;   INCISION AND DRAINAGE ABSCESS Right 06/14/2014   Procedure: INCISION AND DRAINAGE RIGHT BREAST ABSCESS;  Surgeon: Donnice Belinda, MD;  Location: [facility] OR;  Service: General;  Laterality: Right;   IR IMAGING GUIDED PORT INSERTION  12/08/2021   IR PARACENTESIS  02/02/2023   IR PARACENTESIS  02/23/2023   IR PERC TUN PERIT CATH WO PORT S&I /IMAG  02/16/2023   IRRIGATION AND DEBRIDEMENT ABSCESS  12/19/2010   Procedure: IRRIGATION AND DEBRIDEMENT ABSCESS;  Surgeon: Donnice Bury, MD;  Location: [facility];  Service: General;  Laterality: Right;   IRRIGATION AND DEBRIDEMENT ABSCESS  08/13/2011   Procedure: MINOR INCISION AND DRAINAGE OF ABSCESS;  Surgeon: Lynwood KIDD Pina, MD;  Location: [facility] OR;  Service: General;  Laterality: Right;   Right Breast    IRRIGATION AND DEBRIDEMENT ABSCESS  11/17/2011   Procedure: IRRIGATION AND DEBRIDEMENT ABSCESS;  Surgeon: Donnice POUR. Belinda, MD;  Location: [facility];  Service: General;  Laterality: Right;  irrigation and debridement right recurrent breast abscess   LARYNX SURGERY     LEFT HEART CATH AND CORONARY ANGIOGRAPHY N/A 04/09/2021   Procedure: LEFT HEART CATH AND CORONARY ANGIOGRAPHY;  Surgeon: Dann Candyce RAMAN, MD;  Location[facility] INVASIVE CV LAB;  Service: Cardiovascular;  Laterality: N/A;   LEFT HEART CATHETERIZATION WITH CORONARY ANGIOGRAM N/A 05/29/2012   Procedure: LEFT HEART CATHETERIZATION WITH CORONARY ANGIOGRAM & PTCA;  Surgeon: Debby DELENA Sor, MD;  Location: [facility] CATH LAB;  Service: Cardiovascular; Bifurcation dRCA-RPAD/PLA & rPDA  PTCA.   LEFT HEART CATHETERIZATION WITH CORONARY ANGIOGRAM N/A 11/08/2013   Procedure: LEFT HEART CATHETERIZATION WITH CORONARY ANGIOGRAM and Coronary Stent Intervention;  Surgeon: Alm LELON Clay, MD;  Location: [facility] CATH LAB;  Service: Cardiovascular; mLAD 80% (FFR 0.73) - resolute DES 3.0 x 22 mm (postdilated to 3.3 mm->3.5 mm)   NM MYOVIEW  LTD  01/26/2009   Normal study, no evidence of ischemia, EF 67%   ployp removed from voice box 03/30/12     RADIAL ARTERY HARVEST Right 06/30/2021   Procedure: RADIAL ARTERY HARVEST;  Surgeon: Shyrl Linnie KIDD, MD;  Location: [facility];  Service: Open Heart Surgery;  Laterality: Right;   REFRACTIVE SURGERY  ~ 2010   right   TEE WITHOUT CARDIOVERSION N/A 06/30/2021   Procedure: TRANSESOPHAGEAL ECHOCARDIOGRAM (TEE);  Surgeon: Shyrl Linnie KIDD, MD;  Location: [facility] OR;  Service: Open Heart Surgery;  Laterality: N/A;   TONSILLECTOMY  1988   VIDEO BRONCHOSCOPY WITH ENDOBRONCHIAL ULTRASOUND Bilateral 08/21/2021   Procedure: VIDEO BRONCHOSCOPY WITH  ENDOBRONCHIAL ULTRASOUND;  Surgeon: Brenna Adine CROME, DO;  Location: [facility] ENDOS[facility];  Service: Pulmonary;  Laterality: Bilateral;    REVIEW OF SYSTEMS:   Review of Systems   Constitutional: Negative for appetite change, chills, fatigue, fever and unexpected weight change.  HENT: Negative for mouth sores, nosebleeds, sore throat and trouble swallowing.   Eyes: Negative for eye problems and icterus.  Respiratory: Positive for occasional dyspnea and raspy voice. Negative for  hemoptysis and wheezing.   Cardiovascular: Positive for lower extremity swelling. Negative for chest pain.  Gastrointestinal: Negative for abdominal pain, constipation (none at this time), diarrhea (resolved), nausea and vomiting.  Genitourinary: Negative for bladder incontinence, difficulty urinating, dysuria, frequency and hematuria.   Musculoskeletal: Positive for pain and gait problem. Negative for neck pain and neck stiffness.  Skin: Negative for itching and rash.  Neurological: Positive for headaches and scalp numbness. Negative for extremity weakness, gait problem, light-headedness and seizures.  Hematological: Negative for adenopathy. Does not bruise/bleed easily.  Psychiatric/Behavioral: Negative for confusion, depression and sleep disturbance. The patient is not nervous/anxious.     PHYSICAL EXAMINATION:  Last menstrual period 06/12/2016.  ECOG PERFORMANCE STATUS: 1  Physical Exam  Constitutional: Oriented to person, place, and time and well-developed, well-nourished, and in no distress.  HENT:  Head: Normocephalic and atraumatic.  Mouth/Throat: Oropharynx is clear and moist. No oropharyngeal exudate.  Eyes: Conjunctivae are normal. Right eye exhibits no discharge. Left eye exhibits no discharge. No scleral icterus.  Neck: Normal range of motion. Neck supple.  Cardiovascular: Normal rate, regular rhythm, normal heart sounds and intact distal pulses.   Pulmonary/Chest: Effort normal. Quiet breath sounds bilaterally. No respiratory distress. No wheezes. No rales.  Abdominal: Soft. Bowel sounds are normal. Exhibits no distension and no mass. There is no tenderness.   Musculoskeletal: Normal range of motion. Significant lower extremity edema.  Lymphadenopathy:    No cervical adenopathy.  Neurological: Alert and oriented to person, place, and time. Muscle wasting. She was was examined in the wheelchair.  Skin: Skin is warm and dry. Not diaphoretic. No erythema. No pallor.  Psychiatric: Mood, memory and judgment normal.  Vitals reviewed.  LABORATORY DATA: Lab Results  Component Value Date   WBC 11.9 (H) 02/07/2024   HGB 10.4 (L) 02/07/2024   HCT 30.3 (L) 02/07/2024   MCV 91.8 02/07/2024   PLT 261 02/07/2024      Chemistry      Component Value Date/Time   NA 138 02/07/2024 1419   NA 136 03/31/2023 1105   K 3.8 02/07/2024 1419   CL 103 02/07/2024 1419   CO2 25 02/07/2024 1419   BUN 19 02/07/2024 1419   BUN 16 03/31/2023 1105   CREATININE 1.11 (H) 02/07/2024 1419   CREATININE 0.65 11/06/2013 1520      Component Value Date/Time   CALCIUM  8.3 (L) 02/07/2024 1419   ALKPHOS 136 (H) 02/07/2024 1419   AST 66 (H) 02/07/2024 1419   ALT 60 (H) 02/07/2024 1419   BILITOT 0.5 02/07/2024 1419       RADIOGRAPHIC STUDIES:  No results found.   ASSESSMENT/PLAN:  This is a very pleasant 59 year old African-American female diagnosed with stage IV (T2 a, N2, M1 C) non-small cell lung cancer, adenocarcinoma.  The patient presented with a right upper lobe lung mass in addition to right hilar and mediastinal lymphadenopathy.  She also has innumerable bilateral pulmonary nodules, metastatic bone, liver, and adrenal lesions.  She was diagnosed in August 2023.  She is positive for K-ras G12 C  mutation and her PD-L1 expression is 89%     The patient completed  palliative radiotherapy to the metastatic bone lesions at L4 and the sacrum under the care of Dr. Dewey.  The last day radiation was 10/22/21.     The patient was undergoing palliative systemic chemotherapy with carboplatin  for an AUC of 5, Alimta  500 mg per metered square, Keytruda  200 mg IV every 3  weeks.  She is status post 19 cycles.  Starting from cycle #5 the patient started maintenance Alimta  and Keytruda .  Starting from cycle #10, due to the patient's concern with quality of life versus quantity of life, her dose of chemotherapy was slightly reduced to 400 mg/m. She self discontinued her treatment in September 2024 due to side effects and quality of life.    She went on hospice care in 2025.  She was recently discharged from hospice.  She is here today to get back on track. With her appointments   She developed metastatic disease to the brain in October 2025 and underwent SBRT under the care of Dr. Dewey.  The patient was seen with Dr. Sherrod today.  Labs were reviewed.  We will arrange repeat staging brain MRI and CT scan.  We will see her in about 2 weeks to review the results and next steps.   If she had disease progression in the future she can be considered for repeat treatment with immunotherapy or second line treatment with K-ras directed oral treatments or she can reconsider hospice.  - Preferred acetaminophen  for headache management due to safer gastrointestinal profile.  - Encouraged ophthalmology follow-up for visual changes.  Heart failure Chronic heart failure with fluid overload and generalized edema, exacerbated by inconsistent medication management during hospice care. Lack of regular follow-up for heart failure management or diuretic therapy. - Encouraged follow-up with primary care provider for heart failure management and medication reconciliation, including resumption of diuretic therapy as appropriate.  Acute deep vein thrombosis of the right lower extremity High risk for recurrent thromboembolism. Maintained on apixaban  without bleeding complications. Continued anticoagulation warranted as thrombosis risk outweighs bleeding risk. - Continued apixaban  for anticoagulation. - Educated on signs of bleeding and instructed to report any bleeding events  immediately.  Generalized edema secondary to corticosteroid therapy and heart failure Severe generalized edema due to corticosteroid use and heart failure. Prolonged Decadron  use beyond recommended taper period contributes to fluid retention, skin breakdown, and impaired mobility. Long-term corticosteroid use increases risk for edema, gastrointestinal irritation, increased appetite, and hyperglycemia. - Provided Decadron  tapering instructions. - Educated on importance of tapering off corticosteroids to reduce edema and adverse effects. - Advised use of a calendar to track steroid tapering schedule. - Encouraged follow-up with primary care provider for diuretic management.    The patient was advised to call immediately if she has any concerning symptoms in the interval. The patient voices understanding of current disease status and treatment options and is in agreement with the current care plan. All questions were answered. The patient knows to call the clinic with any problems, questions or concerns. We can certainly see the patient much sooner if necessary      Orders Placed This Encounter  Procedures   CT CHEST ABDOMEN PELVIS W CONTRAST    Standing Status:   Future    Expected Date:   02/14/2024    Expiration Date:   02/06/2025    If indicated for the ordered procedure, I authorize the administration of contrast media per Radiology protocol:  Yes    Does the patient have a contrast media/X-ray dye allergy?:   No    Preferred imaging location?:   [facility]    If indicated for the ordered procedure, I authorize the administration of oral contrast media per Radiology protocol:   Yes   MR BRAIN W WO CONTRAST    Standing Status:   Future    Expected Date:   02/14/2024    Expiration Date:   02/06/2025    If indicated for the ordered procedure, I authorize the administration of contrast media per Radiology protocol:   Yes    What is the patient's sedation requirement?:   No  Sedation    Does the patient have a pacemaker or implanted devices?:   No    Use SRS Protocol?:   No    Preferred imaging location?:   [facility] (table limit - 500lbs)   CBC with Differential (Cancer Center Only)    Standing Status:   Future    Expected Date:   02/21/2024    Expiration Date:   02/06/2025   Ferritin    Standing Status:   Future    Expected Date:   02/21/2024    Expiration Date:   02/06/2025   CMP (Cancer Center only)    Standing Status:   Future    Expected Date:   02/21/2024    Expiration Date:   02/06/2025   Iron and Iron Binding Capacity ([facility] only)    Standing Status:   Future    Expected Date:   02/21/2024    Expiration Date:   02/06/2025   Ferritin    Standing Status:   Future    Expected Date:   02/21/2024    Expiration Date:   02/06/2025   Vitamin B12    Standing Status:   Future    Expected Date:   02/21/2024    Expiration Date:   02/06/2025   Folate    Standing Status:   Future    Expected Date:   02/21/2024    Expiration Date:   02/06/2025      Calton CROME Yosselin Zoeller, PA-C 02/07/24  ADDENDUM: Hematology/Oncology Attending: I had a face-to-face encounter with the patient today.  I reviewed her records, lab and recommended her care plan.  This is a 59 years old African-American female with stage IV non-small cell lung cancer, adenocarcinoma diagnosed in August 2023 with positive KRAS G12C mutation and PD-L1 expression of 89%.  The patient was treated in the past with systemic chemotherapy with carboplatin , Alimta  and Keytruda  followed by maintenance treatment with Alimta  and Keytruda  but in September 2024 she decided to discontinue her treatment on her own.  She then decided to enroll into the hospice service of [facility].  She was then for the last 6 months.  She has some issues with the hospice service especially with her pain management.  She was treated with methadone and she did not like that and she was interested in more of  the standard pain management that she had before with the palliative care team.  She was also treated for multiple brain metastases recently.  She was discharged from the hospital service recently and she want to resume her care with her all the previous provider. She presented here today for evaluation and recommendation regarding her condition.  She was also seen by the palliative care team earlier today. I recommended for the patient to have repeat CT scan of the chest,  abdomen and pelvis as well as MRI of the brain to evaluate the status of her disease.  We will see her back for follow-up visit in few weeks for evaluation and discussion of the scan results and recommendation regarding her condition. She was advised to call immediately if she has any other concerning symptoms in the interval. Disclaimer: This note was dictated with voice recognition software. Similar sounding words can inadvertently be transcribed and may be missed upon review. Saylor Murry L Kriya Westra, PA-C

## 2024-02-06 ENCOUNTER — Telehealth

## 2024-02-07 ENCOUNTER — Inpatient Hospital Stay: Admitting: Nurse Practitioner

## 2024-02-07 ENCOUNTER — Encounter: Payer: Self-pay | Admitting: Nurse Practitioner

## 2024-02-07 ENCOUNTER — Telehealth: Payer: Self-pay

## 2024-02-07 ENCOUNTER — Other Ambulatory Visit (HOSPITAL_COMMUNITY): Payer: Self-pay

## 2024-02-07 ENCOUNTER — Inpatient Hospital Stay: Admitting: Physician Assistant

## 2024-02-07 ENCOUNTER — Inpatient Hospital Stay

## 2024-02-07 VITALS — BP 137/70 | HR 71 | Temp 98.1°F | Resp 17 | Ht 67.0 in | Wt 165.0 lb

## 2024-02-07 DIAGNOSIS — I2510 Atherosclerotic heart disease of native coronary artery without angina pectoris: Secondary | ICD-10-CM | POA: Diagnosis not present

## 2024-02-07 DIAGNOSIS — C7951 Secondary malignant neoplasm of bone: Secondary | ICD-10-CM

## 2024-02-07 DIAGNOSIS — I82421 Acute embolism and thrombosis of right iliac vein: Secondary | ICD-10-CM

## 2024-02-07 DIAGNOSIS — M792 Neuralgia and neuritis, unspecified: Secondary | ICD-10-CM | POA: Diagnosis not present

## 2024-02-07 DIAGNOSIS — R110 Nausea: Secondary | ICD-10-CM

## 2024-02-07 DIAGNOSIS — G43909 Migraine, unspecified, not intractable, without status migrainosus: Secondary | ICD-10-CM | POA: Diagnosis not present

## 2024-02-07 DIAGNOSIS — C787 Secondary malignant neoplasm of liver and intrahepatic bile duct: Secondary | ICD-10-CM | POA: Diagnosis not present

## 2024-02-07 DIAGNOSIS — R209 Unspecified disturbances of skin sensation: Secondary | ICD-10-CM

## 2024-02-07 DIAGNOSIS — R109 Unspecified abdominal pain: Secondary | ICD-10-CM

## 2024-02-07 DIAGNOSIS — C3411 Malignant neoplasm of upper lobe, right bronchus or lung: Secondary | ICD-10-CM | POA: Diagnosis present

## 2024-02-07 DIAGNOSIS — K5903 Drug induced constipation: Secondary | ICD-10-CM | POA: Diagnosis not present

## 2024-02-07 DIAGNOSIS — K429 Umbilical hernia without obstruction or gangrene: Secondary | ICD-10-CM

## 2024-02-07 DIAGNOSIS — T451X5A Adverse effect of antineoplastic and immunosuppressive drugs, initial encounter: Secondary | ICD-10-CM | POA: Diagnosis not present

## 2024-02-07 DIAGNOSIS — Z66 Do not resuscitate: Secondary | ICD-10-CM | POA: Diagnosis not present

## 2024-02-07 DIAGNOSIS — R531 Weakness: Secondary | ICD-10-CM | POA: Diagnosis not present

## 2024-02-07 DIAGNOSIS — I82401 Acute embolism and thrombosis of unspecified deep veins of right lower extremity: Secondary | ICD-10-CM | POA: Diagnosis not present

## 2024-02-07 DIAGNOSIS — R4589 Other symptoms and signs involving emotional state: Secondary | ICD-10-CM

## 2024-02-07 DIAGNOSIS — Z87891 Personal history of nicotine dependence: Secondary | ICD-10-CM

## 2024-02-07 DIAGNOSIS — Z515 Encounter for palliative care: Secondary | ICD-10-CM

## 2024-02-07 DIAGNOSIS — R590 Localized enlarged lymph nodes: Secondary | ICD-10-CM | POA: Diagnosis not present

## 2024-02-07 DIAGNOSIS — G620 Drug-induced polyneuropathy: Secondary | ICD-10-CM

## 2024-02-07 DIAGNOSIS — J910 Malignant pleural effusion: Secondary | ICD-10-CM | POA: Diagnosis not present

## 2024-02-07 DIAGNOSIS — R180 Malignant ascites: Secondary | ICD-10-CM | POA: Diagnosis not present

## 2024-02-07 DIAGNOSIS — E1142 Type 2 diabetes mellitus with diabetic polyneuropathy: Secondary | ICD-10-CM | POA: Diagnosis not present

## 2024-02-07 DIAGNOSIS — M25559 Pain in unspecified hip: Secondary | ICD-10-CM | POA: Diagnosis not present

## 2024-02-07 DIAGNOSIS — G479 Sleep disorder, unspecified: Secondary | ICD-10-CM | POA: Diagnosis not present

## 2024-02-07 DIAGNOSIS — I110 Hypertensive heart disease with heart failure: Secondary | ICD-10-CM | POA: Diagnosis not present

## 2024-02-07 DIAGNOSIS — C3491 Malignant neoplasm of unspecified part of right bronchus or lung: Secondary | ICD-10-CM | POA: Diagnosis not present

## 2024-02-07 DIAGNOSIS — J449 Chronic obstructive pulmonary disease, unspecified: Secondary | ICD-10-CM | POA: Diagnosis not present

## 2024-02-07 DIAGNOSIS — C3490 Malignant neoplasm of unspecified part of unspecified bronchus or lung: Secondary | ICD-10-CM

## 2024-02-07 DIAGNOSIS — T451X5S Adverse effect of antineoplastic and immunosuppressive drugs, sequela: Secondary | ICD-10-CM | POA: Diagnosis not present

## 2024-02-07 DIAGNOSIS — C7971 Secondary malignant neoplasm of right adrenal gland: Secondary | ICD-10-CM | POA: Diagnosis not present

## 2024-02-07 DIAGNOSIS — C7931 Secondary malignant neoplasm of brain: Secondary | ICD-10-CM | POA: Diagnosis not present

## 2024-02-07 DIAGNOSIS — C7972 Secondary malignant neoplasm of left adrenal gland: Secondary | ICD-10-CM | POA: Diagnosis not present

## 2024-02-07 DIAGNOSIS — G893 Neoplasm related pain (acute) (chronic): Secondary | ICD-10-CM

## 2024-02-07 DIAGNOSIS — D573 Sickle-cell trait: Secondary | ICD-10-CM | POA: Diagnosis not present

## 2024-02-07 DIAGNOSIS — K449 Diaphragmatic hernia without obstruction or gangrene: Secondary | ICD-10-CM | POA: Diagnosis not present

## 2024-02-07 DIAGNOSIS — Z7189 Other specified counseling: Secondary | ICD-10-CM

## 2024-02-07 DIAGNOSIS — R530 Neoplastic (malignant) related fatigue: Secondary | ICD-10-CM

## 2024-02-07 LAB — CBC WITH DIFFERENTIAL (CANCER CENTER ONLY)
Abs Immature Granulocytes: 0.33 10*3/uL — ABNORMAL HIGH (ref 0.00–0.07)
Basophils Absolute: 0 10*3/uL (ref 0.0–0.1)
Basophils Relative: 0 %
Eosinophils Absolute: 0.1 10*3/uL (ref 0.0–0.5)
Eosinophils Relative: 0 %
HCT: 30.3 % — ABNORMAL LOW (ref 36.0–46.0)
Hemoglobin: 10.4 g/dL — ABNORMAL LOW (ref 12.0–15.0)
Immature Granulocytes: 3 %
Lymphocytes Relative: 5 %
Lymphs Abs: 0.6 10*3/uL — ABNORMAL LOW (ref 0.7–4.0)
MCH: 31.5 pg (ref 26.0–34.0)
MCHC: 34.3 g/dL (ref 30.0–36.0)
MCV: 91.8 fL (ref 80.0–100.0)
Monocytes Absolute: 0.7 10*3/uL (ref 0.1–1.0)
Monocytes Relative: 6 %
Neutro Abs: 10.2 10*3/uL — ABNORMAL HIGH (ref 1.7–7.7)
Neutrophils Relative %: 86 %
Platelet Count: 261 10*3/uL (ref 150–400)
RBC: 3.3 MIL/uL — ABNORMAL LOW (ref 3.87–5.11)
RDW: 14.4 % (ref 11.5–15.5)
WBC Count: 11.9 10*3/uL — ABNORMAL HIGH (ref 4.0–10.5)
nRBC: 0 % (ref 0.0–0.2)

## 2024-02-07 LAB — CMP (CANCER CENTER ONLY)
ALT: 60 U/L — ABNORMAL HIGH (ref 0–44)
AST: 66 U/L — ABNORMAL HIGH (ref 15–41)
Albumin: 3.6 g/dL (ref 3.5–5.0)
Alkaline Phosphatase: 136 U/L — ABNORMAL HIGH (ref 38–126)
Anion gap: 11 (ref 5–15)
BUN: 19 mg/dL (ref 6–20)
CO2: 25 mmol/L (ref 22–32)
Calcium: 8.3 mg/dL — ABNORMAL LOW (ref 8.9–10.3)
Chloride: 103 mmol/L (ref 98–111)
Creatinine: 1.11 mg/dL — ABNORMAL HIGH (ref 0.44–1.00)
GFR, Estimated: 57 mL/min — ABNORMAL LOW
Glucose, Bld: 175 mg/dL — ABNORMAL HIGH (ref 70–99)
Potassium: 3.8 mmol/L (ref 3.5–5.1)
Sodium: 138 mmol/L (ref 135–145)
Total Bilirubin: 0.5 mg/dL (ref 0.0–1.2)
Total Protein: 6 g/dL — ABNORMAL LOW (ref 6.5–8.1)

## 2024-02-07 MED ORDER — OXYCODONE HCL 30 MG PO TABS: 30.0000 mg | ORAL_TABLET | ORAL | 0 refills | Status: DC | PRN

## 2024-02-07 MED ORDER — OXYCODONE HCL 30 MG PO TABS
30.0000 mg | ORAL_TABLET | ORAL | 0 refills | Status: AC | PRN
  Filled 2024-02-07 (×2): qty 90, 15d supply, fill #0

## 2024-02-07 MED ORDER — ONDANSETRON 8 MG PO TBDP: 8.0000 mg | ORAL_TABLET | Freq: Three times a day (TID) | ORAL | 2 refills | Status: AC | PRN

## 2024-02-07 MED ORDER — APIXABAN 5 MG PO TABS: 5.0000 mg | ORAL_TABLET | Freq: Two times a day (BID) | ORAL | 3 refills | Status: DC

## 2024-02-07 MED ORDER — PANTOPRAZOLE SODIUM 40 MG PO TBEC: 40.0000 mg | DELAYED_RELEASE_TABLET | Freq: Two times a day (BID) | ORAL | 5 refills | Status: AC

## 2024-02-07 MED ORDER — POLYETHYLENE GLYCOL 3350 17 GM/SCOOP PO POWD: 17.0000 g | Freq: Every day | ORAL | 1 refills | Status: AC

## 2024-02-07 MED ORDER — DULOXETINE HCL 30 MG PO CPEP: 30.0000 mg | ORAL_CAPSULE | Freq: Every day | ORAL | 5 refills | Status: DC

## 2024-02-07 MED ORDER — DOCUSATE SODIUM 100 MG PO CAPS: 100.0000 mg | ORAL_CAPSULE | Freq: Every day | ORAL | 0 refills | Status: DC

## 2024-02-07 MED ORDER — METOCLOPRAMIDE HCL 10 MG PO TABS: 10.0000 mg | ORAL_TABLET | Freq: Three times a day (TID) | ORAL | 2 refills | Status: AC | PRN

## 2024-02-07 MED ORDER — FENTANYL 75 MCG/HR TD PT72: 1.0000 | MEDICATED_PATCH | TRANSDERMAL | 0 refills | Status: DC

## 2024-02-07 MED ORDER — HYOSCYAMINE SULFATE 0.125 MG SL SUBL: 0.1250 mg | SUBLINGUAL_TABLET | Freq: Four times a day (QID) | SUBLINGUAL | 0 refills | Status: AC | PRN

## 2024-02-07 MED ORDER — GABAPENTIN 300 MG PO CAPS: 600.0000 mg | ORAL_CAPSULE | Freq: Two times a day (BID) | ORAL | 2 refills | Status: DC

## 2024-02-07 NOTE — Progress Notes (Signed)
 "    Palliative Medicine [facility]  [phone] [phone]   Name: Cassidy [name] Date: 02/07/2024 MRN: [id number]  DOB: 1965-12-14  Patient Care Team: Lorrane Pac, MD as PCP - General (Family Medicine) O'Neal, Darryle Ned, MD as PCP - Cardiology (Cardiology) Ebbie Cough, MD as Consulting Physician (General Surgery) Pickenpack-Cousar, Fannie SAILOR, NP as Nurse Practitioner (Hospice and Palliative Medicine) Gordy Channing LABOR, RN as Registered Nurse [facility] Darnella Dorn SAUNDERS, MD as Consulting Physician (Neurosurgery)   INTERVAL HISTORY: Cassidy [name] is a 59 y.o. 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 Patient enrolled in hospice [facility]) back in October 2025 and recently discharged due to no significant health decline and appeared stable. She is now presented back to clinic to re-establish care with the Oncology team and Palliative. Palliative ask to see for symptom management and goals of care.   SOCIAL HISTORY:     reports that she has quit smoking. Her smoking use included cigarettes. She started smoking about 45 years ago. She has a 22.5 pack-year smoking history. She has never used smokeless tobacco. She reports that she does not currently use drugs. She reports that she does not drink alcohol .  ADVANCE DIRECTIVES:  None on file   CODE STATUS: DNR  PAST MEDICAL HISTORY: Past Medical History:  Diagnosis Date   Adenocarcinoma of right lung, stage 4 (HCC) 08/26/2021   Anemia    has sickle cell trait   Atherosclerotic heart disease of native coronary artery with angina pectoris 05/2012, 10/2013   a.  s/p PCTA to dRCA and ostial RPAV-PLA vessel + PDA branch (05/2012)  b. [place]  s/p DES to mLAD - Resolute DES 3.0 x 22 (3.64mm -->3.3 mm)   Bipolar  depression (HCC)    Breast abscess    a. right side.    COPD (chronic obstructive pulmonary disease) (HCC)    Depression    Diabetic peripheral neuropathy (HCC)    Fibromyalgia    Genital warts    GERD (gastroesophageal reflux disease)    History of hiatal hernia    HLD (hyperlipidemia)    Hypertension    Lung cancer (HCC)    Pain in limb    a. LE VENOUS DUPLEX, 02/05/2009 - no evidence of deep vein thrombosis, Baker's cyst   Pneumonia    PTSD (post-traumatic stress disorder)    Sickle cell trait    Tobacco abuse    Tooth caries    Type II diabetes mellitus (HCC)     ALLERGIES:  is allergic to amoxil  [amoxicillin ], chantix [varenicline], suboxone [buprenorphine hcl-naloxone hcl], and emend [fosaprepitant  dimeglumine].  MEDICATIONS:  Current Outpatient Medications  Medication Sig Dispense Refill   fentaNYL  (DURAGESIC ) 75 MCG/HR Place 1 patch onto the skin every 3 (three) days. 10 patch 0   acetaminophen  (TYLENOL ) 500 MG tablet Take 1 tablet (500 mg total) by mouth every 6 (six) hours as needed.     apixaban  (ELIQUIS ) 5 MG TABS tablet Take 1 tablet (5 mg total) by mouth daily. 30 tablet 0   apixaban  (ELIQUIS ) 5 MG TABS tablet Take 1 tablet (5 mg total) by mouth 2 (two) times daily. 60 tablet 3   artificial tears ophthalmic solution Place 1 drop into both eyes as needed for dry eyes.     atorvastatin  (LIPITOR) 40 MG tablet TAKE 1 TABLET (40 MG TOTAL) BY MOUTH  DAILY. 90 tablet 3   busPIRone  (BUSPAR ) 7.5 MG tablet Take 7.5 mg by mouth 2 (two) times daily.     Carboxymethylcellul-Glycerin (LUBRICATING EYE DROPS OP) Place 1 drop into both eyes daily as needed (dry eyes).     carvedilol  (COREG ) 6.25 MG tablet Take 2 tablets (12.5 mg total) by mouth 2 (two) times daily with a meal. 60 tablet 0   diclofenac  Sodium (VOLTAREN ) 1 % GEL Apply 1 application topically 3 times daily as needed for pain 200 g 1   docusate sodium  (COLACE) 100 MG capsule Take 1 capsule (100 mg total) by mouth daily.  10 capsule 0   DULoxetine  (CYMBALTA ) 30 MG capsule Take 1 capsule (30 mg total) by mouth daily. 30 capsule 5   feeding supplement (ENSURE PLUS HIGH PROTEIN) LIQD Take 237 mLs by mouth 2 (two) times daily between meals.     ferrous sulfate  325 (65 FE) MG tablet Take 1 tablet (325 mg total) by mouth daily with breakfast. 30 tablet 2   fluticasone  (FLONASE ) 50 MCG/ACT nasal spray PLACE 1 SPRAY INTO BOTH NOSTRILS DAILY AS NEEDED (CONGESTION). 16 g 1   folic acid  (FOLVITE ) 1 MG tablet TAKE 1 TABLET (1 MG TOTAL) BY MOUTH DAILY. 30 tablet 3   gabapentin  (NEURONTIN ) 300 MG capsule Take 2 capsules (600 mg total) by mouth 2 (two) times daily. 120 capsule 2   glucose blood (ACCU-CHEK AVIVA PLUS) test strip USE AS INSTRUCTED EVERY DAY 50 each 1   hyoscyamine  (LEVSIN  SL) 0.125 MG SL tablet Place 1 tablet (0.125 mg total) under the tongue every 6 (six) hours as needed. For abdominal cramping 30 tablet 0   insulin  glargine (LANTUS ) 100 UNIT/ML Solostar Pen Inject 8 Units into the skin every morning.     Insulin  Pen Needle 32G X 4 MM MISC 1 Needle by Does not apply route in the morning and at bedtime. 120 each 2   ipratropium (ATROVENT  HFA) 17 MCG/ACT inhaler Inhale 2 puffs into the lungs every 4 (four) hours as needed for wheezing (COPD).     lactulose  (CHRONULAC ) 10 GM/15ML solution Take 15 mLs (10 g total) by mouth daily as needed for moderate constipation or severe constipation. 236 mL 0   lidocaine  (LIDODERM ) 5 % Place 1 patch onto the skin daily. Remove & Discard patch within 12 hours or as directed by MD 30 patch 0   lidocaine -prilocaine  (EMLA ) cream Apply 1 Application topically daily as needed (for port access). 30 g 2   metoCLOPramide  (REGLAN ) 10 MG tablet Take 1 tablet (10 mg total) by mouth every 8 (eight) hours as needed for nausea. 60 tablet 2   mometasone -formoterol  (DULERA ) 100-5 MCG/ACT AERO Inhale 2 puffs into the lungs in the morning and at bedtime. 13 g 3   nystatin  (MYCOSTATIN ) 100000 UNIT/ML  suspension Take 5 mLs (500,000 Units total) by mouth 4 (four) times daily. 473 mL 2   ondansetron  (ZOFRAN -ODT) 8 MG disintegrating tablet Take 1 tablet (8 mg total) by mouth every 8 (eight) hours as needed for nausea or vomiting. 30 tablet 2   oxycodone  (ROXICODONE ) 30 MG immediate release tablet Take 1 tablet (30 mg total) by mouth every 4 (four) hours as needed for pain. 90 tablet 0   pantoprazole  (PROTONIX ) 40 MG tablet Take 1 tablet (40 mg total) by mouth 2 (two) times daily. 60 tablet 5   polyethylene glycol powder (GAVILAX) 17 GM/SCOOP powder Take 17 g by mouth daily. 850 g 1   torsemide  (DEMADEX ) 20  MG tablet Take 2 tablets (40 mg total) by mouth daily. 35 tablet 0   valACYclovir  (VALTREX ) 1000 MG tablet Take for 3 days prn each outbreak. 30 tablet 11   No current facility-administered medications for this visit.    VITAL SIGNS: BP 137/70 (BP Location: Left Arm, Patient Position: Sitting) Comment: nurse is aware  Pulse 71   Temp 98.1 F (36.7 C) (Temporal)   Resp 17   Ht 5' 7 (1.702 m)   Wt 165 lb (74.8 kg)   LMP 06/12/2016   SpO2 100%   BMI 25.84 kg/m  Filed Weights   02/07/24 1448  Weight: 165 lb (74.8 kg)    Estimated body mass index is 25.84 kg/m as calculated from the following:   Height as of this encounter: 5' 7 (1.702 m).   Weight as of this encounter: 165 lb (74.8 kg).   PERFORMANCE STATUS (ECOG) : 3 - Symptomatic, >50% confined to bed  Assessment NAD, Thin, Chronically-Ill RRR Normal breathing pattern  Right abdominal PluerX cath in place, hiatal hernia with protruding navel  Bilateral pitting lower extremity edema, oozing with several open blister areas covered with guaze and abdominal padding AAO x4   IMPRESSION: Discussed the use of AI scribe software for clinical note transcription with the patient, who gave verbal consent to proceed.  History of Present Illness DORETTA REMMERT is a 59 year old female with stage four lung cancer who I connected by  phone with for follow-up. Patient referred to [facility] hospice over 2 weeks ago however patient reports she has not been admitted under their services as of yet. Scheduled to have home consultation this afternoon.   She reports issues with constipation, although she is having bowel movements at least once a day. Gavilax is used one or two times a week to manage this.  Did not have concerns of nausea, vomiting, or diarrhea.  Reports her appetite fluctuates.  Some days are better than others.  No significant weight loss per patient.  Ongoing fatigue.  Tries to remain as active as she can around the home using walker for ambulation.  Patient reports she is not leaving the house much due to significant weakness and difficulty getting in and out of the car.  Reports her family comes and run errands for her.  She drains her Pleurx catheter twice a week, removing approximately 1000 mL each time, without issues. She is taking torsemide  for edema, which effectively reduces swelling.  She experiences pain related to a bump on her hip, described as a 'patient skin protectant pad' placed on her in the hospital. Difficulty sleeping is noted due to a small knot forming on her right side, anticipated to become like the larger knot on her left side. Sleeping on her back exacerbates sciatic pain.   Patient reports current pain regimen is managing her pain overall. She is taking oxycodone  1-2 tablets every four hours as needed and uses a 25 mcg fentanyl  patch. Gabapentin  600 mg 3 times daily.  Tolerating regimen without difficulty.  No adjustments at this time.  Patient understands when she is enrolled under hospice they will assist in managing her symptoms in addition to medication refills.   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  She is accompanied by her best friend Celeste.   Patient is alert and able to engage appropriately in  discussions.  She is in a wheelchair.  Of note patient previously under [facility] hospice however recently discharged due to no significant decline in health deemed no longer a hospice candidate by organization.  She was discharged from their services on January 20, 2024.  At that time patient was given 45 tablets of oxycodone  to allow her the ability to reestablish care and manage her chronic pain and other symptoms.  I specifically spoke to him Revonda, RN with [facility] to confirm patient discharge and reasoning as mentioned above.  Mrs. Railey continues to live independently in her home.  She is able to perform some ADLs however with limitations due to the lower extremity edema, fatigue and swelling.  Reports her friend Sherran is who she relies heavily on for support, as her family is not always available. Patient is ambulatory in the home with a walker.  States she is currently in the process of relocating to a different apartment that does not require her to go up and down steps.  Denies uncontrolled nausea, vomiting, or diarrhea.    She experiences significant leg swelling with fluid retention, causing skin irritation and blistering. She cleans her legs twice a day and uses abdominal bandages to manage the swelling. Some days, the swelling decreases, allowing better mobility, but her right leg remains tight, preventing her from wearing shoes.  She uses a walker for mobility and maintains a good appetite despite weight loss from 192 lbs in April 2025 to 165 lbs currently. She experiences alternating constipation and diarrhea, managed with Colace and occasionally lactulose , although she avoids lactulose  due to its impact on her blood sugar.  Erandi is emotional sharing significant anxiety related to her medical care and medication management, compounded by difficulties with her previous care facility, which did not provide adequate information or  support. Concerns about her Medicaid coverage and its impact on her ability to receive care and medications are prominent.  She has a hernia causing abdominal pain and still has a pleurX drain, which yields almost no fluid and causes pain when attempts made to drain.    Her current pain management includes a fentanyl  patch 75 mcg and oxycodone  30 mg, but she reports pain levels of ten out of ten despite medication. Gabapentin  is used for numbness and tingling in her fingers, although she often forgets to take.  She experiences migraines starting in her eye sockets and is concerned about her medication regimen and changes made without her knowledge, particularly regarding her pain management while under hospice.  Complete medication, chart, and social history review completed. Patient denies any alcohol  or illicit drug use. Extensive discussions and review of palliative's role in collaboration with her oncology team to assist in patient's pain and symptom management. Education provided on criteria for ongoing pain support via palliative team.   At this time no adjustments to current regimen.  Patient to continue on fentanyl  75 mcg patch every 72 hours.  Oxycodone  30 mg every 4 hours as needed for moderate to severe breakthrough pain.  Patient previously on oxycodone  15-30 mg but per reports by facility and patient she was consistently taking 30 mg.  To decrease pill count patient is aware we will send in 30 mg tablets and discontinue use of 15 mg tablets.  Patient knows to contact office 2-3 days prior to refill request and to adhere to all required appointments.  All questions answered and support provided.  Goals of care Ms. Denicola is realistic in her understanding that at  some point her health will decline and returning back to hospice care will be most appropriate.  However at this time she wish to continue to treat the treatable allow her every opportunity to continue to thrive  as she feels her  quality of life is sufficient.  Patient reports she is stable in her health and does not feel as though she is at end-of-life currently also as stated by hospice requiring discharge from their program.  She is open to further testing as recommended by her medical team.  I discussed the importance of continued conversation with family and their medical providers regarding overall plan of care and treatment options, ensuring decisions are within the context of the patients values and GOCs. Assessment & Plan  tage 4 right lung adenocarcinoma Stage 4 right lung adenocarcinoma with malignant ascites and right pleurX drain. Reports minimal output from the pleurX drain and abdominal pain. - Continue right pleurX drain management. This was previously managed by her PCP team at [facility]. Patient to re-establish care.   Palliative care management Under palliative care management with focus on symptom control and quality of life. Per RN from [facility] and patient she has been discharged from hospice services effective January 20, 2024 as patient no longer meets hospice criteria given no significant health decline or changes that will result in end-of-life within the expected guidelines of 6 months or less.  Per hospice patient should reestablish care for symptom management specifically pain as well as any home health needs. - Continue palliative care support in collaboration with her oncology team. -Patient is aware to reestablish care with her PCP for nononcology related needs. - Ensured medication refills and management  Neoplasm related pain Severe pain rated at 10/10, managed with fentanyl  patches and oxycodone . Reports fentanyl  patch at 75 mcg every three days and oxycodone  30 mg every four hours as needed.   - Continue fentanyl  patch 75 mcg every three days - Prescribed oxycodone  30 mg every four hours as needed.  Patient understands we would discontinue use of 15 mg to  lessen the pill load and the need to take 1-2 tablets.  Neuropathy Experiencing numbness in fingers, likely due to  neuropathy. Reports forgetting to take gabapentin , which is prescribed for neuropathic symptoms. - Restart use of gabapentin  600 mg twice daily  Drug-induced constipation Experiencing constipation, managed with Colace and Miralax . Reports recent diarrhea, possibly due to a viral infection. - Continue Colace and Miralax  as needed  Abdominal pain and cramping Experiencing abdominal pain and cramping, managed with Zofran  for nausea and stomach cramping. - Continue Zofran  8 mg sublingually as needed  Malignant ascites with right pleurX drain Malignant ascites managed with right pleurX drain. Reports minimal output from the drain and associated abdominal pain. - Continue right pleurX drain management  Nausea Experiencing nausea, managed with Zofran . - Continue Zofran  8 mg sublingually as needed  Umbilical hernia Presence of umbilical hernia, previously small but now more prominent. No recent medical evaluation or intervention. - Continue to monitor umbilical hernia  Follow-up I will plan to see patient back in 2-3 weeks.  Sooner if needed.  Patient expressed understanding and was in agreement with this plan. She also understands that She can call the clinic at any time with any questions, concerns, or complaints.   Any controlled substances utilized were prescribed in the context of palliative care. PDMP has been reviewed.   I personally spent a total of 55 minutes in the care of the  patient today including preparing to see the patient, getting/reviewing separately obtained history, performing a medically appropriate exam/evaluation, counseling and educating, placing orders, referring and communicating with other health care professionals, documenting clinical information in the EHR, and coordinating care. Visit consisted of counseling and education dealing with the complex  and emotionally intense issues of symptom management and palliative care in the setting of serious and potentially life-threatening illness.  Levon Borer, AGPCNP-BC  Palliative Medicine Team/[facility]    "

## 2024-02-07 NOTE — Telephone Encounter (Signed)
 Pt called reporting she was unsure if she could make it in to her appt today d/t weather. RN informed pt that we could r/s if needed. Pt reports that she will try to be here so she can discuss her pain management. Pt will update if things change, no further needs at this time. Oncology team made aware.

## 2024-02-09 ENCOUNTER — Ambulatory Visit: Payer: Self-pay

## 2024-02-09 VITALS — BP 116/63 | HR 88 | Ht 67.0 in | Wt 163.4 lb

## 2024-02-09 DIAGNOSIS — G4489 Other headache syndrome: Secondary | ICD-10-CM

## 2024-02-09 DIAGNOSIS — M79603 Pain in arm, unspecified: Secondary | ICD-10-CM | POA: Diagnosis not present

## 2024-02-09 DIAGNOSIS — Z794 Long term (current) use of insulin: Secondary | ICD-10-CM

## 2024-02-09 DIAGNOSIS — I87332 Chronic venous hypertension (idiopathic) with ulcer and inflammation of left lower extremity: Secondary | ICD-10-CM | POA: Diagnosis not present

## 2024-02-09 DIAGNOSIS — E1165 Type 2 diabetes mellitus with hyperglycemia: Secondary | ICD-10-CM

## 2024-02-09 DIAGNOSIS — M792 Neuralgia and neuritis, unspecified: Secondary | ICD-10-CM | POA: Diagnosis not present

## 2024-02-09 DIAGNOSIS — I82421 Acute embolism and thrombosis of right iliac vein: Secondary | ICD-10-CM

## 2024-02-09 DIAGNOSIS — R5381 Other malaise: Secondary | ICD-10-CM

## 2024-02-09 DIAGNOSIS — C3491 Malignant neoplasm of unspecified part of right bronchus or lung: Secondary | ICD-10-CM

## 2024-02-09 DIAGNOSIS — R188 Other ascites: Secondary | ICD-10-CM

## 2024-02-09 DIAGNOSIS — E1142 Type 2 diabetes mellitus with diabetic polyneuropathy: Secondary | ICD-10-CM | POA: Diagnosis not present

## 2024-02-09 DIAGNOSIS — A6000 Herpesviral infection of urogenital system, unspecified: Secondary | ICD-10-CM

## 2024-02-09 DIAGNOSIS — Z95828 Presence of other vascular implants and grafts: Secondary | ICD-10-CM

## 2024-02-09 DIAGNOSIS — D6859 Other primary thrombophilia: Secondary | ICD-10-CM

## 2024-02-09 DIAGNOSIS — R4589 Other symptoms and signs involving emotional state: Secondary | ICD-10-CM

## 2024-02-09 DIAGNOSIS — R209 Unspecified disturbances of skin sensation: Secondary | ICD-10-CM

## 2024-02-09 MED ORDER — LIDOCAINE-PRILOCAINE 2.5-2.5 % EX CREA: 1.0000 | TOPICAL_CREAM | CUTANEOUS | 0 refills | Status: AC | PRN

## 2024-02-09 MED ORDER — INSULIN GLARGINE 100 UNIT/ML SOLOSTAR PEN: 8.0000 [IU] | PEN_INJECTOR | SUBCUTANEOUS | 2 refills | Status: AC

## 2024-02-09 MED ORDER — TORSEMIDE 20 MG PO TABS: 40.0000 mg | ORAL_TABLET | Freq: Every day | ORAL | 0 refills | Status: AC

## 2024-02-09 MED ORDER — GABAPENTIN 300 MG PO CAPS: 600.0000 mg | ORAL_CAPSULE | Freq: Two times a day (BID) | ORAL | 2 refills | Status: AC

## 2024-02-09 MED ORDER — APIXABAN 5 MG PO TABS: 5.0000 mg | ORAL_TABLET | Freq: Every day | ORAL | 0 refills | Status: AC

## 2024-02-09 MED ORDER — VALACYCLOVIR HCL 1 G PO TABS: ORAL_TABLET | ORAL | 11 refills | Status: AC

## 2024-02-09 MED ORDER — INSULIN PEN NEEDLE 32G X 4 MM MISC: 1.0000 | Freq: Two times a day (BID) | 2 refills | Status: AC

## 2024-02-09 MED ORDER — ACCU-CHEK AVIVA PLUS VI STRP: ORAL_STRIP | 1 refills | Status: AC

## 2024-02-09 MED ORDER — DULOXETINE HCL 60 MG PO CPEP: 60.0000 mg | ORAL_CAPSULE | Freq: Every day | ORAL | 0 refills | Status: AC

## 2024-02-09 MED ORDER — AQUAPHOR EX OINT: TOPICAL_OINTMENT | CUTANEOUS | 0 refills | Status: AC | PRN

## 2024-02-09 NOTE — Assessment & Plan Note (Addendum)
 Well-controlled, last A1c 5.0 4 months ago which point glargine was decreased from 10 units to 8 units daily.  History of polyneuropathy possibly from diabetes as well as chemotherapy, could be contributing to patient's worsening numbness tingling. Microalbumin and creatinine ratio today, repeat A1c given recent change in insulin  regimen. Increase duloxetine  from 30 mg daily to 60 mg daily Continue gabapentin  600 mg twice daily

## 2024-02-09 NOTE — Assessment & Plan Note (Addendum)
 Followed by oncology, pending CT chest abdomen pelvis which will help localize etiology of possible causes of atypical groin/abdominal pain. Continue follow-up with oncology Plan to reevaluate groin pain at next visit

## 2024-02-09 NOTE — Patient Instructions (Addendum)
 Thank you for visiting the clinic today, it was good to see you!  Please always bring your medication bottles  In today's visit we discussed:  Complex medical problems: I have put in refills for many of your medications that we discussed today, they will be available to you at the [facility] pharmacy.  I have also put in a referral to our social workers to discuss supplies and refills for your Port-A-Cath.  Nerve pain: I have increased your duloxetine  from 30 to 60 mg daily.  Leg swelling: I have arranged for you to be seen here at the dermatology clinic for the ulceration significant swelling on your legs.  Headaches: Remember to stay very hydrated and drink lots of water, avoid triggers that worsen your headache, try taking Tylenol  when the headaches come on.  I have also ordered some labs and we will follow-up on those with you.  Please follow-up in 1 week  For any questions, please call the office at [phone] or send me a message in MyChart. Have a great day!  -Fairy Amy, MD  [facility] Family Medicine Resident, PGY-1

## 2024-02-09 NOTE — Assessment & Plan Note (Addendum)
 Significant hypercoagulable risk due to metastatic disease with history of acute DVT.   Refilled Eliquis 

## 2024-02-09 NOTE — Assessment & Plan Note (Addendum)
 Patient requesting assistance with supplies for both Port-A-Cath as well as adult pull-ups.  Also having significant difficulty with ambulation around the house. [facility] referral to speak with social worker to provide other resources for supplies Geriatric clinic referral placed given complex medical care, polypharmacy, as well as evaluation of ADLs/iADLs and possible home health.

## 2024-02-10 ENCOUNTER — Telehealth: Payer: Self-pay

## 2024-02-10 ENCOUNTER — Ambulatory Visit: Payer: Self-pay

## 2024-02-10 ENCOUNTER — Ambulatory Visit: Admitting: Neurology

## 2024-02-10 LAB — SEDIMENTATION RATE: Sed Rate: 27 mm/h (ref 0–40)

## 2024-02-10 LAB — HEMOGLOBIN A1C
Est. average glucose Bld gHb Est-mCnc: 183 mg/dL
Hgb A1c MFr Bld: 8 % — ABNORMAL HIGH (ref 4.8–5.6)

## 2024-02-10 LAB — MICROALBUMIN / CREATININE URINE RATIO
Creatinine, Urine: 33.2 mg/dL
Microalb/Creat Ratio: <9 mg/g{creat} (ref 0–29)
Microalbumin, Urine: <3 ug/mL

## 2024-02-10 NOTE — Progress Notes (Signed)
 Complex Care Management Note  Care Guide Note 02/10/2024 Name: Cassidy [name] MRN: [id number] DOB: 1965-11-16  Cassidy [name] is a 59 y.o. year old female who sees Lorrane Pac, MD for primary care. I reached out to Cassidy [name] by phone today to offer complex care management services.  Ms. Proctor was given information about Complex Care Management services today including:   The Complex Care Management services include support from the care team which includes your Nurse Care Manager, Clinical Social Worker, or Pharmacist.  The Complex Care Management team is here to help remove barriers to the health concerns and goals most important to you. Complex Care Management services are voluntary, and the patient may decline or stop services at any time by request to their care team member.   Complex Care Management Consent Status: Patient agreed to services and verbal consent obtained.   Follow up plan:  Telephone appointment with complex care management team member scheduled for:  02/21/24 and 03/07/24  Encounter Outcome:  Patient Scheduled   Leotis Rase [facility], [facility] Guide  Direct Dial: [phone]  Fax [phone]

## 2024-02-15 ENCOUNTER — Other Ambulatory Visit: Payer: Self-pay | Admitting: Obstetrics

## 2024-02-15 DIAGNOSIS — Z1231 Encounter for screening mammogram for malignant neoplasm of breast: Secondary | ICD-10-CM

## 2024-02-16 ENCOUNTER — Other Ambulatory Visit: Payer: Self-pay

## 2024-02-16 ENCOUNTER — Ambulatory Visit (HOSPITAL_COMMUNITY)

## 2024-02-16 ENCOUNTER — Inpatient Hospital Stay: Admitting: Licensed Clinical Social Worker

## 2024-02-16 ENCOUNTER — Telehealth: Payer: Self-pay

## 2024-02-16 DIAGNOSIS — C3490 Malignant neoplasm of unspecified part of unspecified bronchus or lung: Secondary | ICD-10-CM

## 2024-02-16 NOTE — Progress Notes (Signed)
 [facility] CSW Progress Note   Interventions: CSW spoke w/ pt regarding medical supplies to clean her drain and change her bandages.  Pt confirmed her supplies were coming through the hospice agency, but once services were discontinued her supplies stopped coming.  Pt verified she has re-established w/ her PCP.  CSW instructed pt to contact her PCP to request a home care referral as they would be able to come to the home to determine what supplies she needs and arrange for continual delivery of those items.  Pt reports she has not heard from [employer].  CSW to send a f/u email to [employer] requesting a call to pt.        Follow Up Plan:  Patient will contact CSW with any support or resource needs    Cassidy JONELLE Manna, LCSW Clinical Social Worker [facility]    Patient is participating in a Managed Medicaid Plan:  Yes

## 2024-02-16 NOTE — Telephone Encounter (Signed)
 Spoke with patient regarding CT scan and MRI needing to be rescheduled today due to transportation issues. Patient stated she contacted scheduling and rescheduled CT scan and MRI for 02/29/24. Appointment for port flush with labs and visit with Cassie, PA, was rescheduled to 03/07/24, with labs at 2:30 PM and visit with Cassie, PA, at 3:00 PM. Patient voiced understanding.  Patient also inquired about medical and incontinence supplies. Spoke with social worker Devere, who reiterated to patient that she will need to contact her PCP to place a referral to home health so that necessary supplies may be ordered. Devere also contacted [facility] in [place] to determine if they can provide additional assistance to the patient. Patient voiced understanding.

## 2024-02-17 ENCOUNTER — Other Ambulatory Visit: Payer: Self-pay | Admitting: Medical Oncology

## 2024-02-17 DIAGNOSIS — R188 Other ascites: Secondary | ICD-10-CM

## 2024-02-17 DIAGNOSIS — Z5189 Encounter for other specified aftercare: Secondary | ICD-10-CM

## 2024-02-17 DIAGNOSIS — C3490 Malignant neoplasm of unspecified part of unspecified bronchus or lung: Secondary | ICD-10-CM

## 2024-02-17 DIAGNOSIS — Z95828 Presence of other vascular implants and grafts: Secondary | ICD-10-CM

## 2024-02-17 NOTE — Progress Notes (Signed)
 Referral to [facility] done for Dr Lorrane and Dr Milda Deed family  medicine  [facility]

## 2024-02-21 ENCOUNTER — Telehealth

## 2024-02-23 ENCOUNTER — Ambulatory Visit

## 2024-02-24 ENCOUNTER — Ambulatory Visit

## 2024-02-27 ENCOUNTER — Inpatient Hospital Stay

## 2024-02-27 ENCOUNTER — Inpatient Hospital Stay: Admitting: Physician Assistant

## 2024-02-28 ENCOUNTER — Inpatient Hospital Stay

## 2024-02-29 ENCOUNTER — Observation Stay (HOSPITAL_COMMUNITY)

## 2024-02-29 ENCOUNTER — Ambulatory Visit (HOSPITAL_COMMUNITY)

## 2024-03-07 ENCOUNTER — Inpatient Hospital Stay

## 2024-03-07 ENCOUNTER — Inpatient Hospital Stay: Admitting: Physician Assistant

## 2024-03-07 ENCOUNTER — Telehealth: Admitting: *Deleted

## 2024-08-24 ENCOUNTER — Ambulatory Visit: Payer: Self-pay | Admitting: Neurology
